# Patient Record
Sex: Male | Born: 1950
Health system: Southern US, Community
[De-identification: ages and names within clinical notes are randomized; demographics above are authoritative.]

## PROBLEM LIST (undated history)

## (undated) DIAGNOSIS — I482 Chronic atrial fibrillation, unspecified: Secondary | ICD-10-CM

## (undated) DIAGNOSIS — I5032 Chronic diastolic (congestive) heart failure: Secondary | ICD-10-CM

## (undated) DIAGNOSIS — R011 Cardiac murmur, unspecified: Secondary | ICD-10-CM

## (undated) DIAGNOSIS — C189 Malignant neoplasm of colon, unspecified: Secondary | ICD-10-CM

## (undated) DIAGNOSIS — G4700 Insomnia, unspecified: Secondary | ICD-10-CM

## (undated) DIAGNOSIS — I272 Pulmonary hypertension, unspecified: Secondary | ICD-10-CM

## (undated) DIAGNOSIS — N183 Chronic kidney disease, stage 3 (moderate): Secondary | ICD-10-CM

## (undated) DIAGNOSIS — D649 Anemia, unspecified: Secondary | ICD-10-CM

## (undated) DIAGNOSIS — K7460 Unspecified cirrhosis of liver: Secondary | ICD-10-CM

## (undated) DIAGNOSIS — E785 Hyperlipidemia, unspecified: Secondary | ICD-10-CM

## (undated) DIAGNOSIS — Z9289 Personal history of other medical treatment: Secondary | ICD-10-CM

## (undated) DIAGNOSIS — N189 Chronic kidney disease, unspecified: Secondary | ICD-10-CM

## (undated) DIAGNOSIS — K219 Gastro-esophageal reflux disease without esophagitis: Secondary | ICD-10-CM

## (undated) DIAGNOSIS — E039 Hypothyroidism, unspecified: Secondary | ICD-10-CM

## (undated) DIAGNOSIS — I2510 Atherosclerotic heart disease of native coronary artery without angina pectoris: Secondary | ICD-10-CM

## (undated) DIAGNOSIS — K635 Polyp of colon: Secondary | ICD-10-CM

## (undated) DIAGNOSIS — N289 Disorder of kidney and ureter, unspecified: Secondary | ICD-10-CM

## (undated) DIAGNOSIS — D696 Thrombocytopenia, unspecified: Secondary | ICD-10-CM

## (undated) DIAGNOSIS — F419 Anxiety disorder, unspecified: Secondary | ICD-10-CM

## (undated) DIAGNOSIS — G4730 Sleep apnea, unspecified: Secondary | ICD-10-CM

## (undated) DIAGNOSIS — E119 Type 2 diabetes mellitus without complications: Secondary | ICD-10-CM

## (undated) DIAGNOSIS — I10 Essential (primary) hypertension: Secondary | ICD-10-CM

## (undated) DIAGNOSIS — I429 Cardiomyopathy, unspecified: Secondary | ICD-10-CM

## (undated) DIAGNOSIS — E662 Morbid (severe) obesity with alveolar hypoventilation: Secondary | ICD-10-CM

## (undated) DIAGNOSIS — I509 Heart failure, unspecified: Secondary | ICD-10-CM

## (undated) DIAGNOSIS — K7581 Nonalcoholic steatohepatitis (NASH): Secondary | ICD-10-CM

## (undated) DIAGNOSIS — J45909 Unspecified asthma, uncomplicated: Secondary | ICD-10-CM

## (undated) DIAGNOSIS — E1122 Type 2 diabetes mellitus with diabetic chronic kidney disease: Secondary | ICD-10-CM

## (undated) HISTORY — PX: CIRCUMCISION: SUR203

## (undated) HISTORY — DX: Malignant neoplasm of colon, unspecified: C18.9

## (undated) HISTORY — DX: Heart failure, unspecified: I50.9

## (undated) HISTORY — DX: Chronic kidney disease, unspecified: N18.9

## (undated) HISTORY — DX: Type 2 diabetes mellitus without complications: E11.9

## (undated) HISTORY — DX: Sleep apnea, unspecified: G47.30

## (undated) HISTORY — DX: Polyp of colon: K63.5

## (undated) HISTORY — DX: Personal history of other medical treatment: Z92.89

## (undated) HISTORY — PX: COLON RESECTION: SHX5231

## (undated) HISTORY — DX: Hyperlipidemia, unspecified: E78.5

## (undated) HISTORY — DX: Atherosclerotic heart disease of native coronary artery without angina pectoris: I25.10

## (undated) HISTORY — DX: Cardiomyopathy, unspecified: I42.9

## (undated) HISTORY — DX: Essential (primary) hypertension: I10

## (undated) HISTORY — DX: Chronic kidney disease, stage 3 (moderate): N18.3

## (undated) HISTORY — DX: Type 2 diabetes mellitus with diabetic chronic kidney disease: E11.22

---

## 1997-12-23 ENCOUNTER — Ambulatory Visit (HOSPITAL_COMMUNITY): Admission: RE | Admit: 1997-12-23 | Discharge: 1997-12-23 | Payer: Self-pay | Admitting: Gastroenterology

## 1998-02-19 ENCOUNTER — Ambulatory Visit (HOSPITAL_BASED_OUTPATIENT_CLINIC_OR_DEPARTMENT_OTHER): Admission: RE | Admit: 1998-02-19 | Discharge: 1998-02-19 | Payer: Self-pay | Admitting: Surgery

## 1999-02-18 ENCOUNTER — Encounter: Payer: Self-pay | Admitting: Oncology

## 1999-02-18 ENCOUNTER — Encounter: Admission: RE | Admit: 1999-02-18 | Discharge: 1999-02-18 | Payer: Self-pay | Admitting: Oncology

## 1999-09-26 ENCOUNTER — Emergency Department (HOSPITAL_COMMUNITY): Admission: EM | Admit: 1999-09-26 | Discharge: 1999-09-26 | Payer: Self-pay | Admitting: Emergency Medicine

## 2000-02-29 ENCOUNTER — Encounter (INDEPENDENT_AMBULATORY_CARE_PROVIDER_SITE_OTHER): Payer: Self-pay | Admitting: *Deleted

## 2000-02-29 ENCOUNTER — Ambulatory Visit (HOSPITAL_COMMUNITY): Admission: RE | Admit: 2000-02-29 | Discharge: 2000-02-29 | Payer: Self-pay | Admitting: Gastroenterology

## 2000-03-11 ENCOUNTER — Inpatient Hospital Stay (HOSPITAL_COMMUNITY): Admission: EM | Admit: 2000-03-11 | Discharge: 2000-03-14 | Payer: Self-pay | Admitting: Emergency Medicine

## 2001-05-10 ENCOUNTER — Encounter: Admission: RE | Admit: 2001-05-10 | Discharge: 2001-05-10 | Payer: Self-pay | Admitting: Surgery

## 2001-05-10 ENCOUNTER — Encounter: Payer: Self-pay | Admitting: Surgery

## 2002-05-30 ENCOUNTER — Ambulatory Visit (HOSPITAL_COMMUNITY): Admission: RE | Admit: 2002-05-30 | Discharge: 2002-05-30 | Payer: Self-pay | Admitting: Gastroenterology

## 2002-05-30 ENCOUNTER — Encounter (INDEPENDENT_AMBULATORY_CARE_PROVIDER_SITE_OTHER): Payer: Self-pay | Admitting: Specialist

## 2006-02-21 ENCOUNTER — Inpatient Hospital Stay (HOSPITAL_COMMUNITY): Admission: EM | Admit: 2006-02-21 | Discharge: 2006-02-23 | Payer: Self-pay | Admitting: Emergency Medicine

## 2006-02-22 ENCOUNTER — Encounter (INDEPENDENT_AMBULATORY_CARE_PROVIDER_SITE_OTHER): Payer: Self-pay | Admitting: Cardiovascular Disease

## 2006-08-17 ENCOUNTER — Ambulatory Visit (HOSPITAL_COMMUNITY): Admission: RE | Admit: 2006-08-17 | Discharge: 2006-08-17 | Payer: Self-pay | Admitting: Cardiovascular Disease

## 2010-02-03 ENCOUNTER — Ambulatory Visit (HOSPITAL_BASED_OUTPATIENT_CLINIC_OR_DEPARTMENT_OTHER)
Admission: RE | Admit: 2010-02-03 | Discharge: 2010-02-03 | Disposition: A | Discharge status: Home / Self Care | Payer: Managed Care, Other (non HMO) | Attending: Urology | Admitting: Urology

## 2010-02-03 DIAGNOSIS — Z7901 Long term (current) use of anticoagulants: Secondary | ICD-10-CM | POA: Insufficient documentation

## 2010-02-03 DIAGNOSIS — I10 Essential (primary) hypertension: Secondary | ICD-10-CM | POA: Insufficient documentation

## 2010-02-03 DIAGNOSIS — E669 Obesity, unspecified: Secondary | ICD-10-CM | POA: Insufficient documentation

## 2010-02-03 DIAGNOSIS — N478 Other disorders of prepuce: Secondary | ICD-10-CM | POA: Insufficient documentation

## 2010-02-03 DIAGNOSIS — N471 Phimosis: Secondary | ICD-10-CM | POA: Insufficient documentation

## 2010-02-03 DIAGNOSIS — I4891 Unspecified atrial fibrillation: Secondary | ICD-10-CM | POA: Insufficient documentation

## 2010-02-03 DIAGNOSIS — E119 Type 2 diabetes mellitus without complications: Secondary | ICD-10-CM | POA: Insufficient documentation

## 2010-02-03 LAB — POCT I-STAT 4, (NA,K, GLUC, HGB,HCT)
Glucose, Bld: 128 mg/dL — ABNORMAL HIGH (ref 70–99)
HCT: 46 % (ref 39.0–52.0)
Hemoglobin: 15.6 g/dL (ref 13.0–17.0)
Potassium: 4 mEq/L (ref 3.5–5.1)
Sodium: 142 mEq/L (ref 135–145)

## 2010-02-03 LAB — PROTIME-INR
INR: 1.34 (ref 0.00–1.49)
Prothrombin Time: 16.8 seconds — ABNORMAL HIGH (ref 11.6–15.2)

## 2010-02-03 LAB — APTT: aPTT: 40 seconds — ABNORMAL HIGH (ref 24–37)

## 2010-02-08 NOTE — Op Note (Signed)
  NAMEFAIZ, William Flores                ACCOUNT NO.:  [id number]  MEDICAL RECORD NO.:  [id number]          PATIENT TYPE:  AMB  LOCATION:  [facility]                         FACILITY:  [facility]  PHYSICIAN:  Bernestine Amass, M.D.  DATE OF BIRTH:  [birth year]  DATE OF PROCEDURE: DATE OF DISCHARGE:                              OPERATIVE REPORT   PREOPERATIVE DIAGNOSIS:  Severe phimosis.  POSTOPERATIVE DIAGNOSIS:  Severe phimosis.  PROCEDURE PERFORMED:  Circumcision.  SURGEON:  Bernestine Amass, M.D.  ANESTHESIA:  IV sedation with penile block.  INDICATIONS:  [name] has had very longstanding problems with severe phimosis and has been unable to retract his foreskin for a number of years.  He has had chronic dysuria and has difficulty with voiding.  He was noted in our office to have a pinhole opening.  With urination, he has ballooning of the foreskin with chronic irritation and dysuria. Given the severity of his situation as well as the increased risk for ongoing problems with balanitis as well as penile carcinoma, circumcision was recommended.  The patient is on chronic anticoagulation.  We had permission to discontinue that with Lovenox bridge through his primary care facility.  The patient appeared to understand the rationale as well as potential risks and recovery issues for circumcision.  He understands that his situation maybe more complicated by the severity of phimosis and what is likely to be some significantly adherent mucosal collar.  The patient has been noted currently to have severe phimosis without gross balanitis.  Informed consent has been obtained.  The patient had coagulation studies, which were acceptable.  TECHNIQUE AND FINDINGS:  The patient was brought to the operating room. He received a penile block with a combination of plain Marcaine and lidocaine.  He was prepped and draped in the usual manner and had administration of IV sedation by the anesthesiology services.   A dorsal slit was required in order to be able to access the glans penis.  That foreskin tissue was markedly thickened and showed some chronic inflammatory changes.  Once the glans penis was exposed, it was reprepped.  There were chronic inflammatory changes with thickening on the glans penis.  The meatus otherwise appeared unremarkable.  There was considerable thickening and irritation along the frenulum with a thick frenular attachment as well.  A circumferential incision was made behind the coronal sulcus on the penile shaft.  The sleeve of redundant foreskin was then removed.  A portion of the mucosal collar was grossly adherent to the glans penis and could not be separated.  The skin edges were reapproximated with a number of 4-0 Vicryl sutures.  There were no problems with hemostasis. Some bacitracin ointment was applied and a nonadherent gauze placed very loosely around the incision.  The patient appeared to tolerate the procedure well and there were no obvious complications noted.     Bernestine Amass, M.D.     DSG/MEDQ  D:  02/03/2010  T:  02/03/2010  Job:  [id number]  cc:   [place] Fax: [phone]  Electronically Signed by Rana Snare M.D. on 02/08/2010 08:22:49 AM

## 2010-03-04 HISTORY — PX: US ECHOCARDIOGRAPHY: HXRAD669

## 2010-03-08 DIAGNOSIS — R072 Precordial pain: Secondary | ICD-10-CM

## 2010-05-18 NOTE — Cardiovascular Report (Signed)
NAMETAJUAN, DUFAULT NO.:  [id number]   MEDICAL RECORD NO.:  [id number]          PATIENT TYPE:  OIB   LOCATION:  2899                         FACILITY:  [facility]   PHYSICIAN:  Quay Burow, M.D.   DATE OF BIRTH:  1950/11/18   DATE OF PROCEDURE:  DATE OF DISCHARGE:                            CARDIAC CATHETERIZATION   DIRECT CURRENT CARDIOVERSION:  William Flores is a 60 year old gentleman with nonischemic cardiomyopathy,  EF of 30%, and atrial fibrillation, for which he is symptomatic.  He has  had 6 consecutive weeks of therapeutic Coumadin anticoagulation, who  presents now for outpatient DC cardioversion.   The patient came to the second floor outpatient day cath section C area.  Dr. Glennon Mac was the attending anesthesiologist.  AP pads were placed.  He received 260 mg of Pentothal.  He was shocked 4 times with biphasic  shocks beginning at 120 and increasing to 150 and 200 joules.  We were  unable to successfully cardiovert him, probably because of his impedance  and body habitus.   IMPRESSION:  Unsuccessful attempt at outpatient direct current  cardioversion of atrial fibrillation.   The patient will be discharged home.  I will see him back in the office  in 1-2 weeks.  I will consider referral to an electrophysiologist for  ablation.      Quay Burow, M.D.  Electronically Signed     JB/MEDQ  D:  08/17/2006  T:  08/17/2006  Job:  [id number]   cc:   [facility]

## 2010-05-21 NOTE — Discharge Summary (Signed)
[name], CARVER NO.:  [id number]   MEDICAL RECORD NO.:  [id number]          PATIENT TYPE:  INP   LOCATION:  5409                         FACILITY:  [facility]   PHYSICIAN:  Cyndia Bent, N.P.     DATE OF BIRTH:  1950/07/12   DATE OF ADMISSION:  02/20/2006  DATE OF DISCHARGE:  02/23/2006                               DISCHARGE SUMMARY   HISTORY:  [name] is a 60 year old Caucasian male who came to the  emergency room with atrial flutter of undetermined duration.  He had no  past cardiac medical history.  He was complaining about a 2-week history  of generalized fatigue.  He was placed on intravenous heparin.  He was  ruled out for an myocardial infarction.  It was decided that he should  undergo cardiac catheterization.  This was performed by Dr. [name] on  02/21/2006.  He was found to have only a 50% mid-posterior descending  artery stenosis.  His ejection fraction was 25% with global hypokinesis.  His medications were altered.  He was then placed on Lovenox to  Coumadin.  He was taught how to give himself injections.  On 02/23/2006  his heart rate was controlled at a rate of about 65.  He was considered  stable for discharged to home with Lovenox to Coumadin administration.  The patient goes to [place].  He requested to  have his pro times drawn at [facility].  I  called and left their Coumadin clinic a message about his dosing.   LABORATORY DATA:  Hemoglobin 13.5, hematocrit 40.2, WBC 7.5, platelets  157, INR 1.6.  Sodium 141, potassium 4.5, chloride 107, CO2 of 29,  glucose 157, BUN 19, creatinine 0.98, calcium 8.8, magnesium 2.1.  CK-MB  is negative x2.  Total cholesterol 143.  Triglycerides were 94.  HDL was  25, LDL was 99 and VLDL was 19.  His TSH was 4.669.   DISCHARGE MEDICATIONS:  1. Lovenox 140 mg subcu twice a day every 12 hours.  2. Warfarin 10 mg a day.  3. Simvastatin 80 mg a day.  4. Coreg  3.125 mg twice a day.  5. Aspirin 81 mg a day.  6. Micardis HCT as taken previously once a day.  7. Metformin (which is new) 500 mg twice a day.  8. Cardizem CD 180 mg once a day.   He was told not to take any more Actos because of concern for congestive  heart failure.  He should follow up with his doctors at [facility] for his diabetes control.  At the time of  discharge he had received 2 days of Coumadin 10 mg per day, and his INR  was 1.6.   DISCHARGE DIAGNOSES:  1. Paroxysmal atrial fibrillation.  2. Nonischemic cardiomyopathy with an ejection fraction of 25% by      cardiac catheterization.  3. Minimal coronary artery disease with only a 50% stenosis in his      posterior descending artery.  4. Non insulin dependent diabetes mellitus.  5. History of colon cancer  with resection.      Cyndia Bent, N.P.     BB/MEDQ  D:  02/23/2006  T:  02/24/2006  Job:  [id number]   cc:   [place]

## 2010-05-21 NOTE — Procedures (Signed)
[place]  Patient:    DARAN, FAVARO                       MRN: [id number] Proc. Date: 02/29/00 Adm. Date:  [id number] Attending:  Orvis Brill CC:         [place] Magrinat, M.D.   Procedure Report  PROCEDURE PERFORMED:  Colonoscopy with polypectomy.  INDICATIONS:  The patient with a history of colon cancer and colon polyps due for repeat screening.  Consult was signed after risks, benefits, methods and options thoroughly discussed multiple times in the past.  MEDICATIONS USED:  Demerol 100, Versed 10.  DESCRIPTION OF PROCEDURE:  Rectal inspection is pertinent for external hemorrhoids.  Digital exam was negative.  Video colonoscope was inserted easily advanced to the level of the anastomosis which was identified by colon and the small bowel end.  This did require some abdominal pressure but no position changed.  The prep was adequate.  There was some liquid stool that required a fair amount of washing and suctioning but on slow withdraw from the colon, four transverse polyps along folds were seen and hot biopsied times one or two and the scope was withdrawn around the descending, two slightly larger but still small polyps were seen and were both snared, electrocautery applied and they were suctioned through the scope and collected in the trap.  The scope was withdrawn around the sigmoid.  No additional polypoid lesions were seen.  Once back in the rectum the scope was retroflexed pertinent for some internal hemorrhoids.  The scope was reinserted a short ways up the sigmoid. Air was suctioned.  The scope was removed.  The patient tolerated the procedure well.  There was no obvious immediate complication.  ENDOSCOPIC DIAGNOSES: 1. Internal and external hemorrhoids. 2. Multiple six small polyps, four hot biopsied in the transverse, two snared    in the descending. 3. Otherwise within normal limits to anastomosis.  PLAN:  Await  pathology. Continue being followed by Dr. Jana Hakim with periodic CT scans and lab work.  Pending pathology, probably repeat colonoscopy in two years.  To continue two week post polypectomy instructions. DD:  02/29/00 TD:  02/29/00 Job: [id number]

## 2010-05-21 NOTE — Procedures (Signed)
[place]  Patient:    William Flores, William Flores                      MRN: [id number] Proc. Date: 03/12/00 Attending:  Mickeal Skinner, M.D. CC:         Jeryl Columbia, M.D.   Procedure Report  DATE OF BIRTH:  03-10-1950.  REFERRING PHYSICIAN:  PROCEDURE PERFORMED:  Colonoscopy.  ENDOSCOPIST:  Mickeal Skinner, M.D.  INDICATIONS FOR PROCEDURE:  The patient is a 60 year old male.  The patient underwent a colonoscopy February 29, 2000; six neoplastic noncancerous polyps were removed from the transverse colonn and descending colon.  William Flores developed postcolonoscopic polypectomy bleeding March 11, 2000 which prompted his hospitalization.  He received GoLYTELY colonic lavage prep but continued to bleed for approximately 24 hours posthospitalization.  There has been no significant fall in his hemoglobin.  Colonoscopy to achieve hemostasis is scheduled today.  ALLERGIES:  CODEINE.  CHRONIC MEDICATIONS:  None.  PAST MEDICAL HISTORY:  Segmental right colon resection for colon cancer. Postoperative chemotherapy.  HABITS.  William Flores does not smoke cigarettes or consume alcohol.  He has not consumed nonsteroidal anti-inflammatory drugs.  PREMEDICATION:  Versed 7 mg, Demerol 50 mg.  INSTRUMENT USED:  Olympus pediatric colonoscope.  DESCRIPTION OF PROCEDURE:  After obtaining informed consent, the patient was placed in the left lateral decubitus position.  I administered intravenous Demerol and intravenous Versed to achieve conscious sedation for the procedure.  The patients blood pressure, oxygen saturation and cardiac rhythm were monitored throughout the procedure and documented in the medical record.  The Olympus pediatric video colonoscope was then introduced into the rectum and easily advanced to the ileo-right colonic anastomosis.  There was blood throughout out the rectum and colon.  The site of bleeding was suspected to be  in the distal transverse colon where there was a large blood clot.  This appeared to be a polypectomy site.  The area was injected with epinephrine (1:10,000). The clot was removed without signs of further bleeding.  I attempted to use the Endoclip to prevent recurrent bleeding but we were unable to figure out how to actually use the Endoclip.  ASSESSMENT:  Postcolonoscopic polypectomy bleeding from a polypectomy site in the distal transverse colon. DD:  03/12/00 TD:  03/13/00 Job: [id number]

## 2010-05-21 NOTE — Op Note (Signed)
NAME:  William Flores, William Flores                          ACCOUNT NO.:  [id number]   MEDICAL RECORD NO.:  [id number]                   PATIENT TYPE:  AMB   LOCATION:  ENDO                                 FACILITY:  [facility]   PHYSICIAN:  Jeryl Columbia, M.D.                 DATE OF BIRTH:  [birth year]   DATE OF PROCEDURE:  05/30/2002  DATE OF DISCHARGE:                                 OPERATIVE REPORT   PROCEDURE:  Colonoscopy with polypectomy.   INDICATIONS FOR PROCEDURE:  A patient with a history of colon cancer and  colon polyps due for repeat screening.   Consent was signed after risks, benefits, methods, and options were  thoroughly discussed in the office on multiple occasions.   MEDICINES USED:  Demerol 100, Versed 9.   DESCRIPTION OF PROCEDURE:  Rectal inspection was pertinent for external  hemorrhoids, small. Digital exam was negative. The video colonoscope was  inserted, easily advanced around the colon to the anastomosis. No  significant worrisome lesions were seen on insertion. The anastomosis was  identified by the sutures in the small bowel mucosa as well as the colon  end. The scope was slowly withdrawn. Again the prep was fairly adequate, did  require lots of washing and suctioning for adequate visualization. He also  had lots of spasm and some difficulty holding air which was probably why  some small to tiny polyps are missed and why we find them frequently on his  colonoscopies. However, on slow withdrawal, approximately 6 tiny to small  polyps were seen and were all hot biopsied including sigmoid, descending and  transverse. There was another small polyp in the transverse which was  snared, electrocautery applied and the polyp was suctioned through the scope  and collected in the trap. They were all put in the same containers. In the  more distal transverse, a medium size polyp was seen, snared, electrocautery  applied, polyp was suctioned onto the head of the scope. The  scope was  removed, the polyp recovered. The scope was reinserted at that junction to  the level of the polypectomy site. On insertion, a tiny sigmoid polyp was  seen and was hot biopsied. We then continued our withdrawal and a few  additional tiny to small polyps were seen and were all hot biopsied and they  were all put in the first container. No significant other abnormalities were  seen. Once back in the rectum, the scope was retroflexed pertinent for some  internal hemorrhoids.  The scope was straightened and readvanced a short  ways up the left side of the colon, air and water were suctioned, scope  removed. The patient tolerated the procedure well. There was no obvious or  immediate complications.   ENDOSCOPIC DIAGNOSIS:  1. Internal and external hemorrhoids.  2. Distal transverse medium polyp status post snare put in the second     container.  3.  Multiple tiny to small polyps in the sigmoid, descending and transverse     hot biopsied with one small one in the transverse snared.  4. Otherwise within normal limits to the anastomosis.    PLAN:  Await pathology but probably recheck colon screening every 2-3 years  until colon free of polyps and happy to see back in the meantime p.r.n.;  otherwise, yearly rectals and guaiacs, labs per either Dr. Harlow Asa or Dahlia Client  at [facility] office and will put him on the customary two week no aspirin  or nonsteroidals.                                               Jeryl Columbia, M.D.    MEM/MEDQ  D:  05/30/2002  T:  05/30/2002  Job:  [id number]   cc:   Chipper Herb, M.D.  [place]  [place] [address]  Fax: [phone]   Earnstine Regal, M.D.  [address]  [place] [address]  Fax: [phone]

## 2010-05-21 NOTE — H&P (Signed)
[name], Flores NO.:  [id number]   MEDICAL RECORD NO.:  [id number]          PATIENT TYPE:  INP   LOCATION:  1740                         FACILITY:  [facility]   PHYSICIAN:  Broadus John, MD DATE OF BIRTH:  [birth year]   DATE OF ADMISSION:  02/20/2006  DATE OF DISCHARGE:                              HISTORY & PHYSICAL   William Flores is a 60 year old white male who is admitted to [facility]  for atrial flutter of undetermined duration.   The patient, who has no past history of cardiac disease, presented to  his family physician today with a two-week history of generalized  fatigue.  He was found to be in atrial fibrillation and referred to the  emergency department for further evaluation.   As noted, the patient has no past history of cardiac disease.  He has no  history of chest pain, myocardial infarction, coronary artery disease,  congestive heart failure, or arrhythmia.  However, he has a number of  risk factors for coronary artery disease including hypertension,  dyslipidemia, diabetes mellitus, and a family history of early coronary  artery disease (father).  There is no history of smoking.  During this  two week period, the patient has noted no chest pain, tightness,  heaviness, pressure, or squeezing, nor has he noted any dyspnea, cough,  chest congestion, or sputum production.   PAST MEDICAL HISTORY:  The patient's past medical history is otherwise  remarkable only for colon cancer for which he underwent partial  colectomy approximately seven years ago.   MEDICATIONS:  Unknown.   ALLERGIES:  CODEINE.   OPERATIONS:  Partial colectomy as described above.   FAMILY HISTORY:  His father suffered from coronary artery disease as  described above.  There is also a family history of diabetes.   SOCIAL HISTORY:  The patient lives with his ex-wife.  He works.  He does  not smoke cigarettes.  He does not drink alcohol.   REVIEW OF SYSTEMS:   No problems related to his head, eyes, ears, nose,  mouth, throat, lungs, gastrointestinal system, genitourinary system, or  extremities.  There is no history of neurological or psychiatric  disorder.  There is no history of fever, chills, or weight loss.   PHYSICAL EXAMINATION:  VITAL SIGNS:  Blood pressure 149/96. Pulse 119  and irregularly irregular.  Respirations 20.  Temperature 90.2.  Pulse  oximetry 97% on room air.  GENERAL:  The patient was an obese, middle-aged white man in no  discomfort.  He was alert, oriented, appropriate, and responsive.  HEENT:  Normal.  NECK:  Without thyromegaly or adenopathy.  Carotid pulses were palpable  bilaterally and without bruits.  CARDIAC:  Irregularly irregular rhythm.  There was no gallop, murmur,  rub, or click.  CHEST:  No chest wall tenderness was noted.  LUNGS:  Clear.  ABDOMEN:  Soft, nontender.  There was no mass, hepatosplenomegaly,  bruit, distention, rebound, guarding, or rigidity.  Bowel sounds were  normal.  RECTAL/GENITAL:  Not performed as they were not pertinent to the reason  for acute care  hospitalization.  EXTREMITIES:  Without edema, deviation, or deformity.  Radial and  dorsalis pedal pulses were palpable bilaterally.  NEUROLOGIC:  Brief  screening neurological survey was unremarkable.   LABORATORY DATA:  Electrocardiogram revealed atrial fibrillation with a  ventricular rate of 119 beats per minute.  The chest radiograph,  according to the radiologist, demonstrated cardiomegaly with vascular  congestion and diffuse interstitial disease, likely edema.  Small  bilateral pleural effusions and/or pleural thickening were noted.  Normal sinus rhythm.  There were no repolarization abnormalities  specific for ischemia or infarction.  The initial set of cardiac markers  revealed a myoglobin of 177, CK-MB 2.4, troponin less than 0.05.  Potassium was 3.7,  BUN 16, creatinine 0.8.  White count was 9.1, with a  hemoglobin of 14.8  and hematocrit of 43.5.  Fibrin derivatives were  0.26.  The remaining studies were pending at the time of this dictation.   IMPRESSION:  1. Atrial fibrillation of undetermined duration with a rapid      ventricular rate.  2. Hypertension.  3. Dyslipidemia.  4. Diabetes mellitus.  5. Status post colon cancer.   PLAN:  1. Telemetry.  2. Serial cardiac enzymes.  3. Aspirin.  4. Intravenous heparin.  5. Intravenous diltiazem for rate control.  6. Thyroid function tests.  7. Echocardiogram.  8. Further measures per Dr. Claiborne Billings.      Broadus John, MD  Electronically Signed     MSC/MEDQ  D:  02/21/2006  T:  02/21/2006  Job:  [id number]

## 2010-05-21 NOTE — Cardiovascular Report (Signed)
NAMETELL, ROZELLE NO.:  [id number]   MEDICAL RECORD NO.:  [id number]          PATIENT TYPE:  INP   LOCATION:  0086                         FACILITY:  [facility]   PHYSICIAN:  Quay Burow, M.D.   DATE OF BIRTH:  Apr 27, 1950   DATE OF PROCEDURE:  DATE OF DISCHARGE:                            CARDIAC CATHETERIZATION   Mr. Broughton is a 60 year old mildly overweight divorced white male with a  history of hypertension, hyperlipidemia, and noninsulin-requiring  diabetes.  He was admitted to the ER last night with atrial fibrillation  with RPR, coughing and atypical chest pain.  His cardiac enzymes were  negative.  He was placed on IV heparin and diltiazem for rate control.  He presents now for diagnostic coronary arteriography to define his  anatomy and rule out an ischemic etiology.   DESCRIPTION OF PROCEDURE:  The patient was brought to the second floor  [facility] cardiac catheterization lab in a postabsorptive state.  He  was premedicated with p.o. Valium.  His right groin was prepped and  shaved in the usual sterile fashion.  Xylocaine 1% was used for local  anesthesia.  A 6-French sheath was inserted into the right femoral  artery using standard Seldinger technique.  A 6-French right and left  Judkins diagnostic catheter as well as a 6-French pigtail catheter were  used for selective coronary angiography and left  ventriculography,  respectively.  Visipaque dye was used for the entirety of the case.  Retrograde aortic, left ventricular and pullback pressures were  recorded.   HEMODYNAMICS:  1. Aortic systolic pressure 761, diastolic pressure 84.  2. Left ventricle systolic pressure 950, end-diastolic pressure 26.   SELECTIVE CORONARY ANGIOGRAPHY:  1. Left main normal.  2. LAD normal.  3. Left circumflex normal.  4. Ramus intermedius branch normal.  5. Right coronary artery was dominant with a 50% fairly focal mid PDA      lesion, otherwise was normal.   LEFT VENTRICULOGRAPHY:  RAO left ventriculogram was performed using 25  mL of Visipaque dye at 12 mL/sec.  The overall LVEF was estimated at  approximately 25% with moderate to severe global hypokinesia.   IMPRESSION:  Mr. Matassa has essentially normal coronary arteries with  moderate severe left ventricular dysfunction suggesting a nonischemic  cardiomyopathy.  Medical therapy will be recommend.   The sheath was removed and pressure was held on the groin to achieve  hemostasis.  The patient left the lab in stable condition.  He will be  started on Lovenox and Coumadin anticoagulation as well as carvedilol  and ACE inhibition.  The plan will be ultimately to perform outpatient  DC cardioversion after 4-6 weeks of therapeutic anticoagulation.   The patient left the lab in stable condition.      Quay Burow, M.D.  Electronically Signed     JB/MEDQ  D:  02/21/2006  T:  02/21/2006  Job:  [id number]   cc:   Second Floor Cardiac Catheterization Lab  [place]

## 2010-05-21 NOTE — Discharge Summary (Signed)
[place]  Patient:    William Flores, William Flores                       MRN: [id number] Adm. Date:  [id number] Disc. Date: [id number] Attending:  Mauri Brooklyn Ii                           Discharge Summary  HISTORY:  The patient underwent an uneventful colonoscopy on February 29, 2000 and multiple small polyps were removed with a hot biopsy forceps and he was fine until the morning of admission when he had multiple bloody bowel movements without abdominal pain.  HOSPITAL COURSE:  He was admitted to the hospital by Howell Rucks, who proceeded on the 10th with a colonoscopy to try to identify the bleeding site, which he thought was one of the pump sites in the distal transverse colon, which had seemed to stop, although a visible vessel may have been seen.  He did inject it with epinephrine.  He had attempted to use the endoclip, but was unable to get it to work properly, but the bleeding had stopped with the injection of the epinephrine.  His hemoglobin was followed with drifted down but there were no signs of further bleeding and by the 12th, he was deemed ready for discharge.  He was watched on the 12th ______ low-residue breakfast and lunch.  DISCHARGE INSTRUCTIONS:  Tylenol only, no aspirin or nonsteroidals.  ACTIVITY:  Slowly advanced as tolerated.  DIET:  Continuous low-residue diet for two weeks.  WOUND CARE:  Not applicable.  SPECIAL INSTRUCTIONS:  To call me if any questions, problems, increased fatigue, shortness of breath, bleeding or pain and followup would be as needed or in six weeks to recheck stools, symptoms, possibly CBC and make sure no further workup or plans are needed.  His discharge hemoglobin was 11.3, he did not require a transfusion.  Other labs were essentially normal, except for an albumin of 3.2 and slightly increased sugar.  CONDITION ON DISCHARGE:  Improved.  DISCHARGE DIAGNOSES: 1. History of colon cancer and  colon polyps. 2. Post polypectomy bleeding. 3. Anemia secondary to #2. DD:  04/26/00 TD:  04/26/00 Job: [id number]

## 2012-03-26 ENCOUNTER — Other Ambulatory Visit: Payer: Self-pay | Admitting: Pharmacist

## 2012-03-26 ENCOUNTER — Telehealth: Payer: Self-pay | Admitting: Physician Assistant

## 2012-03-26 DIAGNOSIS — I4891 Unspecified atrial fibrillation: Secondary | ICD-10-CM

## 2012-03-26 MED ORDER — WARFARIN SODIUM 5 MG PO TABS: ORAL_TABLET | ORAL | Status: DC

## 2012-03-26 NOTE — Telephone Encounter (Signed)
Faxed request on the 20th. Not filled yet. Warfarin 25m , diltiazem 240 mg

## 2012-03-26 NOTE — Telephone Encounter (Signed)
Pt's last INR was in January 2014. Protime is needed.  LM on pt's VM to call office for appt.

## 2012-03-27 ENCOUNTER — Other Ambulatory Visit: Payer: Self-pay

## 2012-03-27 DIAGNOSIS — I4891 Unspecified atrial fibrillation: Secondary | ICD-10-CM

## 2012-03-27 MED ORDER — DILTIAZEM HCL ER COATED BEADS 240 MG PO CP24: 240.0000 mg | ORAL_CAPSULE | Freq: Every day | ORAL | Status: DC

## 2012-03-27 MED ORDER — WARFARIN SODIUM 5 MG PO TABS: ORAL_TABLET | ORAL | Status: DC

## 2012-03-29 ENCOUNTER — Ambulatory Visit (INDEPENDENT_AMBULATORY_CARE_PROVIDER_SITE_OTHER): Payer: BC Managed Care – PPO | Admitting: Pharmacist

## 2012-03-29 DIAGNOSIS — I4891 Unspecified atrial fibrillation: Secondary | ICD-10-CM | POA: Insufficient documentation

## 2012-03-29 MED ORDER — WARFARIN SODIUM 5 MG PO TABS: ORAL_TABLET | ORAL | Status: DC

## 2012-03-29 NOTE — Addendum Note (Signed)
Addended by: Cherre Robins on: 03/29/2012 04:25 PM   Modules accepted: Orders

## 2012-04-05 ENCOUNTER — Encounter: Payer: Self-pay | Admitting: Pharmacist

## 2012-04-05 NOTE — Progress Notes (Signed)
Patient ID: William Flores, male   DOB: [birth year], 62 y.o.   MRN: [id number]   erroneous encounter

## 2012-04-07 ENCOUNTER — Other Ambulatory Visit: Payer: Self-pay | Admitting: General Practice

## 2012-04-12 ENCOUNTER — Other Ambulatory Visit: Payer: Self-pay | Admitting: *Deleted

## 2012-04-12 MED ORDER — ATORVASTATIN CALCIUM 20 MG PO TABS: 20.0000 mg | ORAL_TABLET | Freq: Every day | ORAL | Status: DC

## 2012-04-12 NOTE — Telephone Encounter (Signed)
LAST LABS 9/13

## 2012-05-07 ENCOUNTER — Other Ambulatory Visit: Payer: Self-pay | Admitting: Family Medicine

## 2012-05-07 ENCOUNTER — Other Ambulatory Visit: Payer: Self-pay | Admitting: *Deleted

## 2012-05-07 MED ORDER — ALLOPURINOL 100 MG PO TABS: 100.0000 mg | ORAL_TABLET | Freq: Every day | ORAL | Status: DC

## 2012-05-07 NOTE — Telephone Encounter (Signed)
Patient last seen for chronic health check 11-04-11. Last seen in office on 3-27 for protime. Please advise. Thank you

## 2012-05-10 ENCOUNTER — Ambulatory Visit (INDEPENDENT_AMBULATORY_CARE_PROVIDER_SITE_OTHER): Payer: BC Managed Care – PPO | Admitting: Pharmacist

## 2012-05-10 DIAGNOSIS — I4891 Unspecified atrial fibrillation: Secondary | ICD-10-CM

## 2012-05-10 LAB — POCT INR: INR: 2.6

## 2012-05-10 NOTE — Patient Instructions (Signed)
Anticoagulation Dose Instructions as of 05/10/2012     Sun Mon Tue Wed Thu Fri Sat   New Dose 7.5 mg 10 mg 7.5 mg 10 mg 7.5 mg 10 mg 7.5 mg   Alt Week 10 mg 7.5 mg 10 mg 7.5 mg 10 mg 7.5 mg 10 mg    Description       Continue same dose        INR was 2.6 today

## 2012-05-25 ENCOUNTER — Encounter: Payer: Self-pay | Admitting: Gastroenterology

## 2012-05-25 ENCOUNTER — Ambulatory Visit (INDEPENDENT_AMBULATORY_CARE_PROVIDER_SITE_OTHER): Payer: BC Managed Care – PPO | Admitting: Physician Assistant

## 2012-05-25 ENCOUNTER — Encounter: Payer: Self-pay | Admitting: Physician Assistant

## 2012-05-25 VITALS — BP 125/72 | HR 56 | Temp 98.9°F | Ht 63.0 in | Wt 292.0 lb

## 2012-05-25 DIAGNOSIS — M109 Gout, unspecified: Secondary | ICD-10-CM | POA: Insufficient documentation

## 2012-05-25 DIAGNOSIS — E785 Hyperlipidemia, unspecified: Secondary | ICD-10-CM

## 2012-05-25 DIAGNOSIS — I10 Essential (primary) hypertension: Secondary | ICD-10-CM

## 2012-05-25 DIAGNOSIS — C189 Malignant neoplasm of colon, unspecified: Secondary | ICD-10-CM

## 2012-05-25 DIAGNOSIS — I119 Hypertensive heart disease without heart failure: Secondary | ICD-10-CM | POA: Insufficient documentation

## 2012-05-25 DIAGNOSIS — E139 Other specified diabetes mellitus without complications: Secondary | ICD-10-CM

## 2012-05-25 DIAGNOSIS — E119 Type 2 diabetes mellitus without complications: Secondary | ICD-10-CM

## 2012-05-25 LAB — BASIC METABOLIC PANEL WITH GFR
Calcium: 9.5 mg/dL (ref 8.4–10.5)
Chloride: 106 mEq/L (ref 96–112)
Creat: 1.12 mg/dL (ref 0.50–1.35)
GFR, Est Non African American: 71 mL/min
Sodium: 141 mEq/L (ref 135–145)

## 2012-05-25 LAB — LIPID PANEL
HDL: 26 mg/dL — ABNORMAL LOW (ref >39)
LDL Cholesterol: 50 mg/dL (ref 0–99)
Total CHOL/HDL Ratio: 4.8 Ratio
Triglycerides: 246 mg/dL — ABNORMAL HIGH (ref <150)

## 2012-05-25 LAB — HEPATIC FUNCTION PANEL
Albumin: 3.6 g/dL (ref 3.5–5.2)
Alkaline Phosphatase: 61 U/L (ref 39–117)
Total Protein: 6.7 g/dL (ref 6.0–8.3)

## 2012-05-25 MED ORDER — ATORVASTATIN CALCIUM 20 MG PO TABS: 20.0000 mg | ORAL_TABLET | Freq: Every day | ORAL | Status: DC

## 2012-05-25 MED ORDER — ALLOPURINOL 100 MG PO TABS: 100.0000 mg | ORAL_TABLET | Freq: Every day | ORAL | Status: DC

## 2012-05-25 MED ORDER — SITAGLIPTIN PHOS-METFORMIN HCL 50-1000 MG PO TABS: 1.0000 | ORAL_TABLET | Freq: Two times a day (BID) | ORAL | Status: DC

## 2012-05-25 MED ORDER — ALLOPURINOL 300 MG PO TABS: 300.0000 mg | ORAL_TABLET | Freq: Every day | ORAL | Status: DC

## 2012-05-25 MED ORDER — DILTIAZEM HCL ER BEADS 240 MG PO CP24: 240.0000 mg | ORAL_CAPSULE | Freq: Every day | ORAL | Status: DC

## 2012-05-25 MED ORDER — LISINOPRIL 20 MG PO TABS: 20.0000 mg | ORAL_TABLET | Freq: Every day | ORAL | Status: DC

## 2012-05-25 MED ORDER — VALSARTAN-HYDROCHLOROTHIAZIDE 320-25 MG PO TABS: 1.0000 | ORAL_TABLET | Freq: Every day | ORAL | Status: DC

## 2012-05-25 NOTE — Progress Notes (Signed)
Subjective:     Patient ID: William Flores, male   DOB: March 24, 1950, 62 y.o.   MRN: [id number]  Diabetes He presents for his follow-up diabetic visit. He has type 2 diabetes mellitus. No MedicAlert identification noted. His disease course has been stable. There are no hypoglycemic associated symptoms. There are no diabetic associated symptoms. There are no hypoglycemic complications. Symptoms are stable. Diabetic complications include a CVA, heart disease and PVD. Risk factors for coronary artery disease include dyslipidemia, diabetes mellitus, family history, hypertension, male sex and obesity. He is compliant with treatment all of the time. His weight is increasing steadily. He is following a high fat/cholesterol and generally unhealthy diet. When asked about meal planning, he reported none. He has not had a previous visit with a dietician. He rarely participates in exercise. There is no change in his home blood glucose trend. His overall blood glucose range is 130-140 mg/dl. An ACE inhibitor/angiotensin II receptor blocker is being taken. He does not see a podiatrist.Eye exam is not current.  Hypertension This is a chronic problem. The current episode started more than 1 year ago. The problem has been resolved since onset. The problem is controlled. Risk factors for coronary artery disease include diabetes mellitus, dyslipidemia, family history, male gender, obesity and sedentary lifestyle. Past treatments include ACE inhibitors, diuretics and calcium channel blockers. The current treatment provides significant improvement. Compliance problems include exercise.  Hypertensive end-organ damage includes CVA and PVD.  Pt notes recent increase in weight He states his aunt passed away and she did all of the cooking He is now having to eat out and not eating as healthy   Review of Systems  All other systems reviewed and are negative.       Objective:   Physical Exam  Nursing note and vitals  reviewed. Constitutional: He appears well-developed and well-nourished.  HENT:  Right Ear: External ear normal.  Left Ear: External ear normal.  Nose: Nose normal.  Mouth/Throat: Oropharynx is clear and moist.  Neck: Neck supple. No JVD present. No tracheal deviation present.  Cardiovascular: Normal heart sounds and intact distal pulses.  An irregularly irregular rhythm present. Bradycardia present.   Pulmonary/Chest: Effort normal and breath sounds normal.  Musculoskeletal:  Lower ext with hyperpigmentation No edema or ulcerations today  Lymphadenopathy:    He has no cervical adenopathy.       Assessment:     1. HTN (hypertension)   2. Gout   3. Other and unspecified hyperlipidemia   4. Diabetes 1.5, managed as type 2        Plan:     Pt to make eye exam Referral for f/u colonoscopy Refill of meds today x 59mo Pt will try to eat better diet Due to slip in A1C f/u in 3 mos

## 2012-05-25 NOTE — Patient Instructions (Signed)
Diabetes and Foot Care Diabetes may cause you to have a poor blood supply (circulation) to your legs and feet. Because of this, the skin may be thinner, break easier, and heal more slowly. You also may have nerve damage in your legs and feet causing decreased feeling. You may not notice minor injuries to your feet that could lead to serious problems or infections. Taking care of your feet is one of the most important things you can do for yourself.  HOME CARE INSTRUCTIONS  Do not go barefoot. Bare feet are easily injured.  Check your feet daily for blisters, cuts, and redness.  Wash your feet with warm water (not hot) and mild soap. Pat your feet and between your toes until completely dry.  Apply a moisturizing lotion that does not contain alcohol or petroleum jelly to the dry skin on your feet and to dry brittle toenails. Do not put it between your toes.  Trim your toenails straight across. Do not dig under them or around the cuticle.  Do not cut corns or calluses, or try to remove them with medicine.  Wear clean cotton socks or stockings every day. Make sure they are not too tight. Do not wear knee high stockings since they may decrease blood flow to your legs.  Wear leather shoes that fit properly and have enough cushioning. To break in new shoes, wear them just a few hours a day to avoid injuring your feet.  Wear shoes at all times, even in the house.  Do not cross your legs. This may decrease the blood flow to your feet.  If you find a minor scrape, cut, or break in the skin on your feet, keep it and the skin around it clean and dry. These areas may be cleansed with mild soap and water. Do not use peroxide, alcohol, iodine or Merthiolate.  When you remove an adhesive bandage, be sure not to harm the skin around it.  If you have a wound, look at it several times a day to make sure it is healing.  Do not use heating pads or hot water bottles. Burns can occur. If you have lost feeling  in your feet or legs, you may not know it is happening until it is too late.  Report any cuts, sores or bruises to your caregiver. Do not wait! SEEK MEDICAL CARE IF:   You have an injury that is not healing or you notice redness, numbness, burning, or tingling.  Your feet always feel cold.  You have pain or cramps in your legs and feet. SEEK IMMEDIATE MEDICAL CARE IF:   There is increasing redness, swelling, or increasing pain in the wound.  There is a red line that goes up your leg.  Pus is coming from a wound.  You develop an unexplained oral temperature above 102 F (38.9 C), or as your caregiver suggests.  You notice a bad smell coming from an ulcer or wound. MAKE SURE YOU:   Understand these instructions.  Will watch your condition.  Will get help right away if you are not doing well or get worse. Document Released: 12/18/1999 Document Revised: 03/14/2011 Document Reviewed: 06/25/2008 [facility] Patient Information 2014 [place], [place]. Hypertension As your heart beats, it forces blood through your arteries. This force is your blood pressure. If the pressure is too high, it is called hypertension (HTN) or high blood pressure. HTN is dangerous because you may have it and not know it. High blood pressure may mean that your  heart has to work harder to pump blood. Your arteries may be narrow or stiff. The extra work puts you at risk for heart disease, stroke, and other problems.  Blood pressure consists of two numbers, a higher number over a lower, 110/72, for example. It is stated as "110 over 72." The ideal is below 120 for the top number (systolic) and under 80 for the bottom (diastolic). Write down your blood pressure today. You should pay close attention to your blood pressure if you have certain conditions such as:  Heart failure.  Prior heart attack.  Diabetes  Chronic kidney disease.  Prior stroke.  Multiple risk factors for heart disease. To see if you have HTN,  your blood pressure should be measured while you are seated with your arm held at the level of the heart. It should be measured at least twice. A one-time elevated blood pressure reading (especially in the Emergency Department) does not mean that you need treatment. There may be conditions in which the blood pressure is different between your right and left arms. It is important to see your caregiver soon for a recheck. Most people have essential hypertension which means that there is not a specific cause. This type of high blood pressure may be lowered by changing lifestyle factors such as:  Stress.  Smoking.  Lack of exercise.  Excessive weight.  Drug/tobacco/alcohol use.  Eating less salt. Most people do not have symptoms from high blood pressure until it has caused damage to the body. Effective treatment can often prevent, delay or reduce that damage. TREATMENT  When a cause has been identified, treatment for high blood pressure is directed at the cause. There are a large number of medications to treat HTN. These fall into several categories, and your caregiver will help you select the medicines that are best for you. Medications may have side effects. You should review side effects with your caregiver. If your blood pressure stays high after you have made lifestyle changes or started on medicines,   Your medication(s) may need to be changed.  Other problems may need to be addressed.  Be certain you understand your prescriptions, and know how and when to take your medicine.  Be sure to follow up with your caregiver within the time frame advised (usually within two weeks) to have your blood pressure rechecked and to review your medications.  If you are taking more than one medicine to lower your blood pressure, make sure you know how and at what times they should be taken. Taking two medicines at the same time can result in blood pressure that is too low. SEEK IMMEDIATE MEDICAL CARE  IF:  You develop a severe headache, blurred or changing vision, or confusion.  You have unusual weakness or numbness, or a faint feeling.  You have severe chest or abdominal pain, vomiting, or breathing problems. MAKE SURE YOU:   Understand these instructions.  Will watch your condition.  Will get help right away if you are not doing well or get worse. Document Released: 12/20/2004 Document Revised: 03/14/2011 Document Reviewed: 08/10/2007 [facility] Patient Information 2014 [place].

## 2012-06-20 ENCOUNTER — Telehealth: Payer: Self-pay | Admitting: *Deleted

## 2012-06-20 ENCOUNTER — Encounter: Payer: Self-pay | Admitting: Gastroenterology

## 2012-06-20 ENCOUNTER — Ambulatory Visit (INDEPENDENT_AMBULATORY_CARE_PROVIDER_SITE_OTHER): Payer: BC Managed Care – PPO | Admitting: Gastroenterology

## 2012-06-20 VITALS — BP 172/80 | HR 72 | Ht 63.0 in | Wt 287.8 lb

## 2012-06-20 DIAGNOSIS — Z8500 Personal history of malignant neoplasm of unspecified digestive organ: Secondary | ICD-10-CM

## 2012-06-20 NOTE — Assessment & Plan Note (Signed)
Patient is due for followup colonoscopy. I'll check with his PCP whether Coumadin can be held.

## 2012-06-20 NOTE — Telephone Encounter (Signed)
[place] [phone] Fax [phone] Phone  06/20/2012    RE: William Flores DOB: 1950/01/10 MRN: [id number]   Dear [name] [place],    We have scheduled the above patient for an endoscopic procedure. Our records show that he is on anticoagulation therapy.   Please advise as to how long the patient may come off his therapy of coumadin prior to the procedure, which is scheduled for 08/14/2012.  Please fax back/ or route the completed form to [facility] at [phone].   Sincerely,  Genella Mech

## 2012-06-20 NOTE — Patient Instructions (Addendum)
You will be contaced by our office prior to your procedure for directions on holding your Coumadin/Warfarin.  If you do not hear from our office 1 week prior to your scheduled procedure, please call [phone] to discuss.  You have been scheduled for a colonoscopy with propofol. Please follow written instructions given to you at your visit today.  Please pick up your prep kit at the pharmacy within the next 1-3 days. If you use inhalers (even only as needed), please bring them with you on the day of your procedure. Your physician has requested that you go to www.startemmi.com and enter the access code given to you at your visit today. This web site gives a general overview about your procedure. However, you should still follow specific instructions given to you by our office regarding your preparation for the procedure.  SUPREP sample given

## 2012-06-20 NOTE — Progress Notes (Signed)
History of Present Illness: William Flores 62 year old white male with history of colon cancer, atrial fibrillation, on Coumadin, referred at the request of Dr. Laurance Flatten for colonoscopy. Colon cancer was resected about 10-12 years ago. Last colonoscopy was at least 6 years ago. At one point he had a what sounds post polypectomy bleed. He currently has no GI complaints including change of bowel habits, abdominal pain, melena or hematochezia.     Past Medical History  Diagnosis Date  . Diabetes   . Colon cancer   . Hypertension   . Sleep apnea    Past Surgical History  Procedure Laterality Date  . Colon resection     family history includes Breast cancer in his mother and Diabetes in his mother. Current Outpatient Prescriptions  Medication Sig Dispense Refill  . allopurinol (ZYLOPRIM) 100 MG tablet Take 1 tablet (100 mg total) by mouth daily. Take 1 tablet by mouth daily along with the 39m  30 tablet  0  . allopurinol (ZYLOPRIM) 300 MG tablet Take 1 tablet (300 mg total) by mouth daily.  30 tablet  3  . aspirin 81 MG tablet Take 81 mg by mouth daily.      .Marland Kitchenatorvastatin (LIPITOR) 20 MG tablet Take 1 tablet (20 mg total) by mouth daily.  30 tablet  3  . diltiazem (TIAZAC) 240 MG 24 hr capsule Take 1 capsule (240 mg total) by mouth daily.  30 capsule  3  . lisinopril (PRINIVIL,ZESTRIL) 20 MG tablet Take 1 tablet (20 mg total) by mouth daily.  30 tablet  3  . sitaGLIPtan-metformin (JANUMET) 50-1000 MG per tablet Take 1 tablet by mouth 2 (two) times daily with a meal.  60 tablet  3  . valsartan-hydrochlorothiazide (DIOVAN-HCT) 320-25 MG per tablet Take 1 tablet by mouth daily.  30 tablet  3  . warfarin (COUMADIN) 5 MG tablet Take 1 to 2 tablets by mouth daily as directed by anticoag clinic  60 tablet  2   No current facility-administered medications for this visit.   Allergies as of 06/20/2012 - Review Complete 06/20/2012  Allergen Reaction Noted  . Codeine  03/29/2012    reports that he has  never smoked. He has never used smokeless tobacco. He reports that he does not drink alcohol or use illicit drugs.     Review of Systems: Pertinent positive and negative review of systems were noted in the above HPI section. All other review of systems were otherwise negative.  Vital signs were reviewed in today's medical record Physical Exam: General: He is an obese male in no acute distress Skin: anicteric Head: Normocephalic and atraumatic Eyes:  sclerae anicteric, EOMI Ears: Normal auditory acuity Mouth: No deformity or lesions Neck: Supple, no masses or thyromegaly Lungs: Clear throughout to auscultation Heart: Regular rate and rhythm; no murmurs, rubs or bruits Abdomen: Soft, non tender and non distended. No masses, hepatosplenomegaly or hernias noted. Normal Bowel sounds Rectal:deferred Musculoskeletal: Symmetrical with no gross deformities  Skin: No lesions on visible extremities Pulses:  Normal pulses noted Extremities: There are chronic venous stasis changes bilaterally in the lower extremities Neurological: Alert oriented x 4, grossly nonfocal Cervical Nodes:  No significant cervical adenopathy Inguinal Nodes: No significant inguinal adenopathy Psychological:  Alert and cooperative. Normal mood and affect          Past Medical History  Diagnosis Date  . Diabetes   . Colon cancer   . Hypertension   . Sleep apnea    Past Surgical History  Procedure  Laterality Date  . Colon resection     family history includes Breast cancer in his mother and Diabetes in his mother. Current Outpatient Prescriptions  Medication Sig Dispense Refill  . allopurinol (ZYLOPRIM) 100 MG tablet Take 1 tablet (100 mg total) by mouth daily. Take 1 tablet by mouth daily along with the 358m  30 tablet  0  . allopurinol (ZYLOPRIM) 300 MG tablet Take 1 tablet (300 mg total) by mouth daily.  30 tablet  3  . aspirin 81 MG tablet Take 81 mg by mouth daily.      .Marland Kitchenatorvastatin (LIPITOR) 20  MG tablet Take 1 tablet (20 mg total) by mouth daily.  30 tablet  3  . diltiazem (TIAZAC) 240 MG 24 hr capsule Take 1 capsule (240 mg total) by mouth daily.  30 capsule  3  . lisinopril (PRINIVIL,ZESTRIL) 20 MG tablet Take 1 tablet (20 mg total) by mouth daily.  30 tablet  3  . sitaGLIPtan-metformin (JANUMET) 50-1000 MG per tablet Take 1 tablet by mouth 2 (two) times daily with a meal.  60 tablet  3  . valsartan-hydrochlorothiazide (DIOVAN-HCT) 320-25 MG per tablet Take 1 tablet by mouth daily.  30 tablet  3  . warfarin (COUMADIN) 5 MG tablet Take 1 to 2 tablets by mouth daily as directed by anticoag clinic  60 tablet  2   No current facility-administered medications for this visit.   Allergies as of 06/20/2012 - Review Complete 06/20/2012  Allergen Reaction Noted  . Codeine  03/29/2012    reports that he has never smoked. He has never used smokeless tobacco. He reports that he does not drink alcohol or use illicit drugs.     Review of Systems: Pertinent positive and negative review of systems were noted in the above HPI section. All other review of systems were otherwise negative.  Vital signs were reviewed in today's medical record Physical Exam: General: Well developed , well nourished, no acute distress Skin: anicteric Head: Normocephalic and atraumatic Eyes:  sclerae anicteric, EOMI Ears: Normal auditory acuity Mouth: No deformity or lesions Neck: Supple, no masses or thyromegaly Lungs: Clear throughout to auscultation Heart: Regular rate and rhythm; no murmurs, rubs or bruits Abdomen: Soft, non tender and non distended. No masses, hepatosplenomegaly or hernias noted. Normal Bowel sounds Rectal:deferred Musculoskeletal: Symmetrical with no gross deformities  Skin: No lesions on visible extremities Pulses:  Normal pulses noted Extremities: No clubbing, cyanosis, edema or deformities noted Neurological: Alert oriented x 4, grossly nonfocal Cervical Nodes:  No significant  cervical adenopathy Inguinal Nodes: No significant inguinal adenopathy Psychological:  Alert and cooperative. Normal mood and affect

## 2012-06-21 NOTE — Telephone Encounter (Signed)
Scheduled visit with one of the newer providers.

## 2012-06-21 NOTE — Telephone Encounter (Signed)
appt discuss- will see tammy on Monday then already has appt with dr Jacelyn Grip

## 2012-06-25 ENCOUNTER — Ambulatory Visit (INDEPENDENT_AMBULATORY_CARE_PROVIDER_SITE_OTHER): Payer: BC Managed Care – PPO | Admitting: Pharmacist

## 2012-06-25 DIAGNOSIS — I4891 Unspecified atrial fibrillation: Secondary | ICD-10-CM

## 2012-06-25 LAB — POCT INR: INR: 3.1

## 2012-06-25 NOTE — Patient Instructions (Addendum)
Anticoagulation Dose Instructions as of 06/25/2012     Dorene Grebe Tue Wed Thu Fri Sat   New Dose 7.5 mg 10 mg 7.5 mg 10 mg 7.5 mg 10 mg 7.5 mg   Alt Week 10 mg 7.5 mg 10 mg 7.5 mg 10 mg 7.5 mg 10 mg    Description       Take 1 tablet instead of 1 and 1/2 just today, then resume same dose. Prior to colonoscopy hold warfarin 5 days prior - starting August 7th.  May resume warfarin after procedure as advised by GI.       INR was 3.1 today.

## 2012-07-04 ENCOUNTER — Other Ambulatory Visit: Payer: Self-pay | Admitting: Family Medicine

## 2012-07-12 ENCOUNTER — Other Ambulatory Visit: Payer: Self-pay

## 2012-07-12 MED ORDER — ALLOPURINOL 100 MG PO TABS: 100.0000 mg | ORAL_TABLET | Freq: Every day | ORAL | Status: DC

## 2012-08-06 ENCOUNTER — Ambulatory Visit (INDEPENDENT_AMBULATORY_CARE_PROVIDER_SITE_OTHER): Payer: BC Managed Care – PPO | Admitting: Pharmacist

## 2012-08-06 DIAGNOSIS — I4891 Unspecified atrial fibrillation: Secondary | ICD-10-CM

## 2012-08-06 NOTE — Patient Instructions (Addendum)
Colonoscopy scheduled for 7:30 Tuesday, August 12th.  Patient instructed to hold warfarin starting August 7th and restart as directed by gastro specialists.  Anticoagulation Dose Instructions as of 08/06/2012     William Flores Tue Wed Thu Fri Sat   New Dose 7.5 mg 10 mg 7.5 mg 7.5 mg 7.5 mg 10 mg 7.5 mg    Description       No warfarin today, then start 50m Mondays and Fridays and 7.581mall other days. Prior to colonoscopy hold warfarin 5 days prior - starting August 7th.  May resume warfarin after procedure as advised by GI.        INR was 3.5 today

## 2012-08-07 ENCOUNTER — Telehealth: Payer: Self-pay | Admitting: *Deleted

## 2012-08-09 ENCOUNTER — Other Ambulatory Visit: Payer: Self-pay | Admitting: *Deleted

## 2012-08-09 ENCOUNTER — Telehealth: Payer: Self-pay | Admitting: Pharmacist

## 2012-08-09 DIAGNOSIS — Z1211 Encounter for screening for malignant neoplasm of colon: Secondary | ICD-10-CM

## 2012-08-09 NOTE — Telephone Encounter (Signed)
Doc,      This patient does not qualify for services at [facility] due to morbid obesity and a BMI of 50.99.      Thanks for your consideration in this matter.      With kind regards,      John Nulty                  Left message for pt, that procedure in [place] will be cancelled . Procedure needs to be rescheduled at [facility]. Asked pt to contact the off as soon as possible to reschedue

## 2012-08-09 NOTE — Telephone Encounter (Signed)
noted 

## 2012-08-14 ENCOUNTER — Encounter: Payer: BC Managed Care – PPO | Admitting: Gastroenterology

## 2012-08-14 ENCOUNTER — Telehealth: Payer: Self-pay | Admitting: *Deleted

## 2012-08-14 NOTE — Telephone Encounter (Signed)
Patients procedure moved to 09/21/2012 at 12:30pm L/m for pt

## 2012-08-14 NOTE — Telephone Encounter (Signed)
Pt understand time change of his hospital procedure

## 2012-08-30 ENCOUNTER — Ambulatory Visit (INDEPENDENT_AMBULATORY_CARE_PROVIDER_SITE_OTHER): Payer: BC Managed Care – PPO | Admitting: Pharmacist

## 2012-08-30 ENCOUNTER — Encounter: Payer: Self-pay | Admitting: Family Medicine

## 2012-08-30 ENCOUNTER — Ambulatory Visit (INDEPENDENT_AMBULATORY_CARE_PROVIDER_SITE_OTHER): Payer: BC Managed Care – PPO | Admitting: Family Medicine

## 2012-08-30 VITALS — BP 139/75 | HR 71 | Temp 97.7°F | Wt 291.1 lb

## 2012-08-30 DIAGNOSIS — M109 Gout, unspecified: Secondary | ICD-10-CM

## 2012-08-30 DIAGNOSIS — Z8500 Personal history of malignant neoplasm of unspecified digestive organ: Secondary | ICD-10-CM

## 2012-08-30 DIAGNOSIS — E119 Type 2 diabetes mellitus without complications: Secondary | ICD-10-CM

## 2012-08-30 DIAGNOSIS — E139 Other specified diabetes mellitus without complications: Secondary | ICD-10-CM

## 2012-08-30 DIAGNOSIS — I4891 Unspecified atrial fibrillation: Secondary | ICD-10-CM

## 2012-08-30 DIAGNOSIS — E785 Hyperlipidemia, unspecified: Secondary | ICD-10-CM

## 2012-08-30 DIAGNOSIS — I10 Essential (primary) hypertension: Secondary | ICD-10-CM

## 2012-08-30 LAB — POCT GLYCOSYLATED HEMOGLOBIN (HGB A1C): Hemoglobin A1C: 7.3

## 2012-08-30 MED ORDER — COLCHICINE 0.6 MG PO TABS: 0.6000 mg | ORAL_TABLET | Freq: Every day | ORAL | Status: DC

## 2012-08-30 NOTE — Progress Notes (Signed)
Patient ID: William Flores, male   DOB: [birth year], 62 y.o.   MRN: [id number] SUBJECTIVE: CC: Chief Complaint  Patient presents with  . Follow-up    3 month follow up thinks has gout in right foot    HPI: Patient is here for follow up of Diabetes Mellitus: Symptoms evaluated: Denies Nocturia ,Denies Urinary Frequency , denies Blurred vision ,deniesDizziness,denies.Dysuria,denies paresthesias, denies extremity pain or ulcers.Marland Kitchendenies chest pain. has had an annual eye exam. do check the feet. Does check CBGs. Average CBG:160-170 Denies episodes of hypoglycemia. Does have an emergency hypoglycemic plan. admits toCompliance with medications. Denies Problems with medications.  Breakfast: peanut butter sandwich on wheat, usually egg biscuit Lunch: sandwich pimento cheese Supper: green beans, potatoes, meat Diet sodas.  Past Medical History  Diagnosis Date  . Diabetes   . Colon cancer   . Hypertension   . Sleep apnea   . Atrial fibrillation    Past Surgical History  Procedure Laterality Date  . Colon resection     History   Social History  . Marital Status: Married    Spouse Name: N/A    Number of Children: 1  . 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 Smoking status: Never Smoker   . Smokeless tobacco: Never Used  . Alcohol Use: No  . Drug Use: No  . Sexual Activity: Not on file   Other Topics Concern  . Not on file   Social History Narrative  . No narrative on file   Family History  Problem Relation Age of Onset  . Breast cancer Mother   . Diabetes Mother    Current Outpatient Prescriptions on File Prior to Visit  Medication Sig Dispense Refill  . allopurinol (ZYLOPRIM) 100 MG tablet Take 1 tablet (100 mg total) by mouth daily. Take 1 tablet by mouth daily along with the 356m  30 tablet  2  . allopurinol (ZYLOPRIM) 300 MG tablet Take 1 tablet (300 mg total) by mouth daily.  30 tablet  3  .  aspirin 81 MG tablet Take 81 mg by mouth daily.      .Marland Kitchenatorvastatin (LIPITOR) 20 MG tablet Take 1 tablet (20 mg total) by mouth daily.  30 tablet  3  . diltiazem (TIAZAC) 240 MG 24 hr capsule Take 1 capsule (240 mg total) by mouth daily.  30 capsule  3  . lisinopril (PRINIVIL,ZESTRIL) 20 MG tablet Take 1 tablet (20 mg total) by mouth daily.  30 tablet  3  . sitaGLIPtan-metformin (JANUMET) 50-1000 MG per tablet Take 1 tablet by mouth 2 (two) times daily with a meal.  60 tablet  3  . valsartan-hydrochlorothiazide (DIOVAN-HCT) 320-25 MG per tablet Take 1 tablet by mouth daily.  30 tablet  3  . warfarin (COUMADIN) 5 MG tablet TAKE 1 TO 2 TABLETS DAILY AS DIRECTED BY ANTICOAGULATION CLINIC  60 tablet  2   No current facility-administered medications on file prior to visit.   Allergies  Allergen Reactions  . Codeine     Headache     There is no immunization history on file for this patient. Prior to Admission medications   Medication Sig Start Date End Date Taking? Authorizing Provider  allopurinol (ZYLOPRIM) 100 MG tablet Take 1 tablet (100 mg total) by mouth daily. Take 1 tablet by mouth daily along with the 3042m7/10/14  Yes WiLodema PilotPA-C  allopurinol (ZYLOPRIM) 300 MG tablet Take 1  tablet (300 mg total) by mouth daily. 05/25/12  Yes Lodema Pilot, PA-C  aspirin 81 MG tablet Take 81 mg by mouth daily.   Yes Historical Provider, MD  atorvastatin (LIPITOR) 20 MG tablet Take 1 tablet (20 mg total) by mouth daily. 05/25/12  Yes Lodema Pilot, PA-C  diltiazem (TIAZAC) 240 MG 24 hr capsule Take 1 capsule (240 mg total) by mouth daily. 05/25/12  Yes Lodema Pilot, PA-C  lisinopril (PRINIVIL,ZESTRIL) 20 MG tablet Take 1 tablet (20 mg total) by mouth daily. 05/25/12  Yes Lodema Pilot, PA-C  sitaGLIPtan-metformin (JANUMET) 50-1000 MG per tablet Take 1 tablet by mouth 2 (two) times daily with a meal. 05/25/12  Yes Lodema Pilot, PA-C  valsartan-hydrochlorothiazide (DIOVAN-HCT)  320-25 MG per tablet Take 1 tablet by mouth daily. 05/25/12  Yes Lodema Pilot, PA-C  warfarin (COUMADIN) 5 MG tablet TAKE 1 TO 2 TABLETS DAILY AS DIRECTED BY ANTICOAGULATION CLINIC 07/04/12  Yes Chipper Herb, MD  colchicine 0.6 MG tablet Take 1 tablet (0.6 mg total) by mouth daily. 08/30/12   Vernie Shanks, MD    ROS: As above in the HPI. All other systems are stable or negative.  OBJECTIVE: APPEARANCE:  Patient in no acute distress.The patient appeared well nourished and normally developed. Acyanotic. Waist: 53 inches VITAL SIGNS:BP 139/75  Pulse 71  Temp(Src) 97.7 F (36.5 C) (Oral)  Wt 291 lb 1.6 oz (132.042 kg)  BMI 51.58 kg/m2 Morbidly WM  SKIN: warm and  Dry without overt rashes, tattoos and scars  HEAD and Neck: without JVD, Head and scalp: normal Eyes:No scleral icterus. Fundi normal, eye movements normal. Ears: Auricle normal, canal normal, Tympanic membranes normal, insufflation normal. Nose: normal Throat: normal Neck & thyroid: normal  CHEST & LUNGS: Chest wall: normal Lungs: Clear  CVS: Reveals the PMI to be normally located. Regular rhythm, First and Second Heart sounds are normal,  absence of murmurs, rubs or gallops. ABDOMEN:  Appearance: obese Benign, no organomegaly, no masses, no Abdominal Aortic enlargement. No Guarding , no rebound. No Bruits. Bowel sounds: normal  RECTAL: N/A GU: N/A  EXTREMETIES: nonedematous.  MUSCULOSKELETAL:  Spine: normal Joints: intact right foot. Tender the 5 th metattarso phalangeal joint  NEUROLOGIC: oriented to time,place and person; nonfocal. Strength is normal Sensory is normal Reflexes are normal Cranial Nerves are normal.  ASSESSMENT: A-fib  Diabetes 1.5, managed as type 2 - Plan: POCT glycosylated hemoglobin (Hb A1C), CMP14+EGFR, Microalbumin, urine, CANCELED: POCT UA - Microalbumin  Gout - Plan: Uric acid  HTN (hypertension) - Plan: CMP14+EGFR  Other and unspecified hyperlipidemia - Plan:  CMP14+EGFR, NMR, lipoprofile  Personal history of malignant neoplasm of unspecified site in gastrointestinal tract   PLAN: Orders Placed This Encounter  Procedures  . CMP14+EGFR  . NMR, lipoprofile  . Uric acid  . Microalbumin, urine  . POCT glycosylated hemoglobin (Hb A1C)    Meds ordered this encounter  Medications  . colchicine 0.6 MG tablet    Sig: Take 1 tablet (0.6 mg total) by mouth daily.    Dispense:  30 tablet    Refill:  1    Results for orders placed in visit on 08/30/12  POCT GLYCOSYLATED HEMOGLOBIN (HGB A1C)      Result Value Range   Hemoglobin A1C 7.3%           Dr Paula Libra Recommendations  For nutrition information, I recommend books:  1).Eat to Live by Dr Excell Seltzer. 2).Prevent and Reverse Heart Disease  by Dr Karl Luke. 3) Dr Janene Harvey Book:  Program to Reverse Diabetes  Exercise recommendations are:  If unable to walk, then the patient can exercise in a chair 3 times a day. By flapping arms like a bird gently and raising legs outwards to the front.  If ambulatory, the patient can go for walks for 30 minutes 3 times a week. Then increase the intensity and duration as tolerated.  Goal is to try to attain exercise frequency to 5 times a week.  If applicable: Best to perform resistance exercises (machines or weights) 2 days a week and cardio type exercises 3 days per week.  Return in about 3 months (around 11/30/2012) for Recheck medical problems.  Mcarthur Ivins P. Jacelyn Grip, M.D.

## 2012-08-30 NOTE — Patient Instructions (Addendum)
      Dr Sheilah Rayos's Recommendations  For nutrition information, I recommend books:  1).Eat to Live by Dr Joel Fuhrman. 2).Prevent and Reverse Heart Disease by Dr Caldwell Esselstyn. 3) Dr Neal Barnard's Book:  Program to Reverse Diabetes  Exercise recommendations are:  If unable to walk, then the patient can exercise in a chair 3 times a day. By flapping arms like a bird gently and raising legs outwards to the front.  If ambulatory, the patient can go for walks for 30 minutes 3 times a week. Then increase the intensity and duration as tolerated.  Goal is to try to attain exercise frequency to 5 times a week.  If applicable: Best to perform resistance exercises (machines or weights) 2 days a week and cardio type exercises 3 days per week.  

## 2012-08-30 NOTE — Patient Instructions (Signed)
Anticoagulation Dose Instructions as of 08/30/2012     William Flores Tue Wed Thu Fri Sat   New Dose 7.5 mg 10 mg 7.5 mg 7.5 mg 7.5 mg 10 mg 7.5 mg    Description       Continue 53m Mondays and Fridays and 7.529mall other days.       INR was 2.8 today

## 2012-08-31 LAB — CMP14+EGFR
ALT: 23 IU/L (ref 0–44)
AST: 23 IU/L (ref 0–40)
Albumin/Globulin Ratio: 1.4 (ref 1.1–2.5)
Albumin: 3.8 g/dL (ref 3.6–4.8)
Alkaline Phosphatase: 68 IU/L (ref 39–117)
BUN/Creatinine Ratio: 17 (ref 10–22)
BUN: 17 mg/dL (ref 8–27)
CO2: 23 mmol/L (ref 18–29)
Calcium: 8.9 mg/dL (ref 8.6–10.2)
Chloride: 98 mmol/L (ref 97–108)
Creatinine, Ser: 1 mg/dL (ref 0.76–1.27)
GFR calc Af Amer: 94 mL/min/{1.73_m2} (ref >59)
GFR calc non Af Amer: 81 mL/min/{1.73_m2} (ref >59)
Globulin, Total: 2.7 g/dL (ref 1.5–4.5)
Glucose: 185 mg/dL — ABNORMAL HIGH (ref 65–99)
Potassium: 4 mmol/L (ref 3.5–5.2)
Sodium: 140 mmol/L (ref 134–144)
Total Bilirubin: 0.5 mg/dL (ref 0.0–1.2)
Total Protein: 6.5 g/dL (ref 6.0–8.5)

## 2012-08-31 LAB — URIC ACID: Uric Acid: 5.9 mg/dL (ref 3.7–8.6)

## 2012-08-31 LAB — NMR, LIPOPROFILE
Cholesterol: 119 mg/dL (ref <200)
HDL Cholesterol by NMR: 27 mg/dL — ABNORMAL LOW (ref >=40)
HDL Particle Number: 23.8 umol/L — ABNORMAL LOW (ref >=30.5)
LDL Particle Number: 1107 nmol/L — ABNORMAL HIGH (ref <1000)
LDL Size: 20.3 nm — ABNORMAL LOW (ref >20.5)
LDLC SERPL CALC-MCNC: 64 mg/dL (ref <100)
LP-IR Score: 68 — ABNORMAL HIGH (ref <=45)
Small LDL Particle Number: 709 nmol/L — ABNORMAL HIGH (ref <=527)
Triglycerides by NMR: 141 mg/dL (ref <150)

## 2012-08-31 LAB — MICROALBUMIN, URINE: Microalbumin, Urine: 2107.8 ug/mL — ABNORMAL HIGH (ref 0.0–17.0)

## 2012-09-13 ENCOUNTER — Ambulatory Visit (INDEPENDENT_AMBULATORY_CARE_PROVIDER_SITE_OTHER): Payer: BC Managed Care – PPO | Admitting: Pharmacist

## 2012-09-13 VITALS — BP 130/62 | Wt 282.0 lb

## 2012-09-13 DIAGNOSIS — E139 Other specified diabetes mellitus without complications: Secondary | ICD-10-CM

## 2012-09-13 MED ORDER — ONETOUCH DELICA LANCETS 33G MISC: 1.0000 | Freq: Every day | Status: DC

## 2012-09-13 MED ORDER — GLUCOSE BLOOD VI STRP: ORAL_STRIP | Status: DC

## 2012-09-13 NOTE — Progress Notes (Signed)
Gave patient glucometer and taught to use.  No charge for this appt - will RTC 9/28 for follow up.

## 2012-09-21 ENCOUNTER — Ambulatory Visit (HOSPITAL_COMMUNITY)
Admission: RE | Admit: 2012-09-21 | Discharge: 2012-09-21 | Disposition: A | Discharge status: Home / Self Care | Payer: BC Managed Care – PPO | Source: Ambulatory Visit | Attending: Gastroenterology | Admitting: Gastroenterology

## 2012-09-21 ENCOUNTER — Encounter (HOSPITAL_COMMUNITY): Payer: Self-pay

## 2012-09-21 ENCOUNTER — Encounter (HOSPITAL_COMMUNITY)
Admission: RE | Disposition: A | Discharge status: Home / Self Care | Payer: Self-pay | Source: Ambulatory Visit | Attending: Gastroenterology

## 2012-09-21 DIAGNOSIS — I10 Essential (primary) hypertension: Secondary | ICD-10-CM | POA: Insufficient documentation

## 2012-09-21 DIAGNOSIS — Z85038 Personal history of other malignant neoplasm of large intestine: Secondary | ICD-10-CM | POA: Insufficient documentation

## 2012-09-21 DIAGNOSIS — Z1211 Encounter for screening for malignant neoplasm of colon: Secondary | ICD-10-CM

## 2012-09-21 DIAGNOSIS — D126 Benign neoplasm of colon, unspecified: Secondary | ICD-10-CM | POA: Insufficient documentation

## 2012-09-21 DIAGNOSIS — I4891 Unspecified atrial fibrillation: Secondary | ICD-10-CM | POA: Insufficient documentation

## 2012-09-21 DIAGNOSIS — G4730 Sleep apnea, unspecified: Secondary | ICD-10-CM | POA: Insufficient documentation

## 2012-09-21 DIAGNOSIS — Z7982 Long term (current) use of aspirin: Secondary | ICD-10-CM | POA: Insufficient documentation

## 2012-09-21 DIAGNOSIS — Z09 Encounter for follow-up examination after completed treatment for conditions other than malignant neoplasm: Secondary | ICD-10-CM | POA: Insufficient documentation

## 2012-09-21 DIAGNOSIS — K635 Polyp of colon: Secondary | ICD-10-CM

## 2012-09-21 DIAGNOSIS — Z79899 Other long term (current) drug therapy: Secondary | ICD-10-CM | POA: Insufficient documentation

## 2012-09-21 DIAGNOSIS — Z7901 Long term (current) use of anticoagulants: Secondary | ICD-10-CM | POA: Insufficient documentation

## 2012-09-21 DIAGNOSIS — E119 Type 2 diabetes mellitus without complications: Secondary | ICD-10-CM | POA: Insufficient documentation

## 2012-09-21 HISTORY — DX: Polyp of colon: K63.5

## 2012-09-21 HISTORY — PX: COLONOSCOPY: SHX5424

## 2012-09-21 LAB — GLUCOSE, CAPILLARY: Glucose-Capillary: 125 mg/dL — ABNORMAL HIGH (ref 70–99)

## 2012-09-21 SURGERY — COLONOSCOPY
Anesthesia: Moderate Sedation

## 2012-09-21 MED ORDER — MIDAZOLAM HCL 5 MG/5ML IJ SOLN
INTRAMUSCULAR | Status: DC | PRN
  Administered 2012-09-21 (×3): 2 mg via INTRAVENOUS

## 2012-09-21 MED ORDER — FENTANYL CITRATE 0.05 MG/ML IJ SOLN
INTRAMUSCULAR | Status: AC
  Filled 2012-09-21: qty 2

## 2012-09-21 MED ORDER — MIDAZOLAM HCL 10 MG/2ML IJ SOLN
INTRAMUSCULAR | Status: AC
  Filled 2012-09-21: qty 2

## 2012-09-21 MED ORDER — FENTANYL CITRATE 0.05 MG/ML IJ SOLN
INTRAMUSCULAR | Status: DC | PRN
  Administered 2012-09-21 (×3): 25 ug via INTRAVENOUS

## 2012-09-21 MED ORDER — SODIUM CHLORIDE 0.9 % IV SOLN: INTRAVENOUS | Status: DC

## 2012-09-21 NOTE — H&P (Signed)
History of Present Illness: Pleasant 62 year old white male with history of colon cancer, atrial fibrillation, on Coumadin, referred at the request of Dr. Laurance Flatten for colonoscopy. Colon cancer was resected about 10-12 years ago. Last colonoscopy was at least 6 years ago. At one point he had a what sounds post polypectomy bleed. He currently has no GI complaints including change of bowel habits, abdominal pain, melena or hematochezia.  Past Medical History   Diagnosis  Date   .  Diabetes    .  Colon cancer    .  Hypertension    .  Sleep apnea     Past Surgical History   Procedure  Laterality  Date   .  Colon resection     family history includes Breast cancer in his mother and Diabetes in his mother.  Current Outpatient Prescriptions   Medication  Sig  Dispense  Refill   .  allopurinol (ZYLOPRIM) 100 MG tablet  Take 1 tablet (100 mg total) by mouth daily. Take 1 tablet by mouth daily along with the 367m  30 tablet  0   .  allopurinol (ZYLOPRIM) 300 MG tablet  Take 1 tablet (300 mg total) by mouth daily.  30 tablet  3   .  aspirin 81 MG tablet  Take 81 mg by mouth daily.     .Marland Kitchen atorvastatin (LIPITOR) 20 MG tablet  Take 1 tablet (20 mg total) by mouth daily.  30 tablet  3   .  diltiazem (TIAZAC) 240 MG 24 hr capsule  Take 1 capsule (240 mg total) by mouth daily.  30 capsule  3   .  lisinopril (PRINIVIL,ZESTRIL) 20 MG tablet  Take 1 tablet (20 mg total) by mouth daily.  30 tablet  3   .  sitaGLIPtan-metformin (JANUMET) 50-1000 MG per tablet  Take 1 tablet by mouth 2 (two) times daily with a meal.  60 tablet  3   .  valsartan-hydrochlorothiazide (DIOVAN-HCT) 320-25 MG per tablet  Take 1 tablet by mouth daily.  30 tablet  3   .  warfarin (COUMADIN) 5 MG tablet  Take 1 to 2 tablets by mouth daily as directed by anticoag clinic  60 tablet  2    No current facility-administered medications for this visit.    Allergies as of 06/20/2012 - Review Complete 06/20/2012   Allergen  Reaction  Noted   .   Codeine   03/29/2012   reports that he has never smoked. He has never used smokeless tobacco. He reports that he does not drink alcohol or use illicit drugs.  Review of Systems: Pertinent positive and negative review of systems were noted in the above HPI section. All other review of systems were otherwise negative.  Vital signs were reviewed in today's medical record  Physical Exam:  General: He is an obese male in no acute distress  Skin: anicteric  Head: Normocephalic and atraumatic  Eyes: sclerae anicteric, EOMI  Ears: Normal auditory acuity  Mouth: No deformity or lesions  Neck: Supple, no masses or thyromegaly  Lungs: Clear throughout to auscultation  Heart: Regular rate and rhythm; no murmurs, rubs or bruits  Abdomen: Soft, non tender and non distended. No masses, hepatosplenomegaly or hernias noted. Normal Bowel sounds  Rectal:deferred  Musculoskeletal: Symmetrical with no gross deformities  Skin: No lesions on visible extremities  Pulses: Normal pulses noted  Extremities: There are chronic venous stasis changes bilaterally in the lower extremities Neurological: Alert oriented x 4, grossly nonfocal  Cervical  Nodes: No significant cervical adenopathy  Inguinal Nodes: No significant inguinal adenopathy  Psychological: Alert and cooperative. Normal mood and affect  Imp - Personal history of malignant neoplasm of unspecified site in gastrointestinal tract - Inda Castle, MD at 06/20/2012 2:14 PM   Status: Written Related Problem: Personal history of malignant neoplasm of unspecified site in gastrointestinal tract        Patient is due for followup colonoscopy. I'll check with his PCP whether Coumadin can be held.

## 2012-09-21 NOTE — Op Note (Signed)
[facility] [place], [address]   COLONOSCOPY PROCEDURE REPORT  PATIENT: William Flores, William Flores  MR#: [id number] BIRTHDATE: 03-27-1950 , 62  yrs. old GENDER: Male ENDOSCOPIST: Inda Castle, MD REFERRED XY:VOPFYT Laurance Flatten, M.D. PROCEDURE DATE:  09/21/2012 PROCEDURE:   Colonoscopy with snare polypectomy ASA CLASS:   Class II INDICATIONS:Patient's personal history of adenomatous colon polyps and High risk patient with personal history of colon cancer. MEDICATIONS: These medications were titrated to patient response per physician's verbal order, Versed 6 mg IV, and Fentanyl 75 mcg IV  DESCRIPTION OF PROCEDURE:   After the risks benefits and alternatives of the procedure were thoroughly explained, informed consent was obtained.  A digital rectal exam revealed no abnormalities of the rectum.   The Pentax Ped Colon [id number] endoscope was introduced through the anus and advanced to the cecum, which was identified by both the appendix and ileocecal valve. No adverse events experienced.   The quality of the prep was Suprep fair  The instrument was then slowly withdrawn as the colon was fully examined. Photodocumentation not available     COLON FINDINGS: A sessile polyp measuring 6 mm in size with a friable surface was found in the descending colon.  A polypectomy was performed using snare cautery. The polyp was initially removed with the cold polypectomy snare.  There was a polyp remnants at the base with a moderate amount of bleeding.  This was removed with a hot polypectomy snare and submitted to pathology. The resection was complete and the polyp tissue was completely retrieved.   The colon mucosa was otherwise normal.  Retroflexed views revealed no abnormalities. The time to cecum=  .  Withdrawal time=21 minutes 0 seconds.  The scope was withdrawn and the procedure completed. COMPLICATIONS: There were no complications.  ENDOSCOPIC IMPRESSION: 1.    Sessile polyp measuring 6 mm in size was found in the descending colon; polypectomy was performed using snare cautery 2.   The colon mucosa was otherwise normal  RECOMMENDATIONS: If the polyp(s) removed today are proven to be adenomatous (pre-cancerous) polyps, you will need a repeat colonoscopy in 5 years.  Otherwise you should continue to follow colorectal cancer screening guidelines for "routine risk" patients with colonoscopy in 10 years.  You will receive a letter within 1-2 weeks with the results of your biopsy as well as final recommendations.  Please call my office if you have not received a letter after 3 weeks.   eSigned:  Inda Castle, MD 09/21/2012 1:22 PM   cc:

## 2012-09-24 ENCOUNTER — Encounter (HOSPITAL_COMMUNITY): Payer: Self-pay | Admitting: Gastroenterology

## 2012-10-01 ENCOUNTER — Encounter: Payer: Self-pay | Admitting: Gastroenterology

## 2012-10-01 ENCOUNTER — Ambulatory Visit (INDEPENDENT_AMBULATORY_CARE_PROVIDER_SITE_OTHER): Payer: BC Managed Care – PPO | Admitting: Pharmacist

## 2012-10-01 VITALS — Ht 63.0 in | Wt 283.0 lb

## 2012-10-01 DIAGNOSIS — I10 Essential (primary) hypertension: Secondary | ICD-10-CM

## 2012-10-01 DIAGNOSIS — I4891 Unspecified atrial fibrillation: Secondary | ICD-10-CM

## 2012-10-01 DIAGNOSIS — E139 Other specified diabetes mellitus without complications: Secondary | ICD-10-CM

## 2012-10-01 DIAGNOSIS — M109 Gout, unspecified: Secondary | ICD-10-CM

## 2012-10-01 LAB — POCT INR: INR: 2.9

## 2012-10-01 MED ORDER — ALLOPURINOL 300 MG PO TABS: 300.0000 mg | ORAL_TABLET | Freq: Every day | ORAL | Status: DC

## 2012-10-01 MED ORDER — LISINOPRIL 20 MG PO TABS: 20.0000 mg | ORAL_TABLET | Freq: Every day | ORAL | Status: DC

## 2012-10-01 MED ORDER — VALSARTAN-HYDROCHLOROTHIAZIDE 320-25 MG PO TABS: 1.0000 | ORAL_TABLET | Freq: Every day | ORAL | Status: DC

## 2012-10-01 NOTE — Patient Instructions (Signed)
Anticoagulation Dose Instructions as of 10/01/2012     Dorene Grebe Tue Wed Thu Fri Sat   New Dose 7.5 mg 10 mg 7.5 mg 7.5 mg 7.5 mg 10 mg 7.5 mg    Description       Continue 70m Mondays and Fridays and 7.574mall other days.        INR was 2.9 today

## 2012-10-05 ENCOUNTER — Other Ambulatory Visit: Payer: Self-pay | Admitting: Physician Assistant

## 2012-10-09 ENCOUNTER — Other Ambulatory Visit: Payer: Self-pay | Admitting: Physician Assistant

## 2012-10-19 ENCOUNTER — Other Ambulatory Visit: Payer: Self-pay | Admitting: Physician Assistant

## 2012-11-01 ENCOUNTER — Ambulatory Visit (INDEPENDENT_AMBULATORY_CARE_PROVIDER_SITE_OTHER): Payer: BC Managed Care – PPO | Admitting: Pharmacist

## 2012-11-01 ENCOUNTER — Other Ambulatory Visit: Payer: Self-pay | Admitting: Physician Assistant

## 2012-11-01 DIAGNOSIS — I4891 Unspecified atrial fibrillation: Secondary | ICD-10-CM

## 2012-11-01 DIAGNOSIS — Z23 Encounter for immunization: Secondary | ICD-10-CM

## 2012-11-01 NOTE — Patient Instructions (Signed)
Anticoagulation Dose Instructions as of 11/01/2012     Dorene Grebe Tue Wed Thu Fri Sat   New Dose 7.5 mg 10 mg 7.5 mg 7.5 mg 7.5 mg 10 mg 7.5 mg    Description       Continue 88m Mondays and Fridays and 7.534mall other days.       INR was 2.3 today

## 2012-11-08 ENCOUNTER — Other Ambulatory Visit: Payer: Self-pay | Admitting: Family Medicine

## 2012-12-20 ENCOUNTER — Ambulatory Visit (INDEPENDENT_AMBULATORY_CARE_PROVIDER_SITE_OTHER): Payer: BC Managed Care – PPO | Admitting: Family Medicine

## 2012-12-20 ENCOUNTER — Encounter: Payer: Self-pay | Admitting: Family Medicine

## 2012-12-20 VITALS — BP 138/81 | HR 73 | Temp 99.1°F | Ht 63.0 in | Wt 283.8 lb

## 2012-12-20 DIAGNOSIS — B352 Tinea manuum: Secondary | ICD-10-CM | POA: Insufficient documentation

## 2012-12-20 DIAGNOSIS — E785 Hyperlipidemia, unspecified: Secondary | ICD-10-CM

## 2012-12-20 DIAGNOSIS — B353 Tinea pedis: Secondary | ICD-10-CM

## 2012-12-20 DIAGNOSIS — I10 Essential (primary) hypertension: Secondary | ICD-10-CM

## 2012-12-20 DIAGNOSIS — B351 Tinea unguium: Secondary | ICD-10-CM | POA: Insufficient documentation

## 2012-12-20 DIAGNOSIS — I4891 Unspecified atrial fibrillation: Secondary | ICD-10-CM

## 2012-12-20 DIAGNOSIS — E139 Other specified diabetes mellitus without complications: Secondary | ICD-10-CM

## 2012-12-20 DIAGNOSIS — D126 Benign neoplasm of colon, unspecified: Secondary | ICD-10-CM

## 2012-12-20 DIAGNOSIS — E119 Type 2 diabetes mellitus without complications: Secondary | ICD-10-CM

## 2012-12-20 DIAGNOSIS — M109 Gout, unspecified: Secondary | ICD-10-CM

## 2012-12-20 LAB — POCT UA - MICROALBUMIN: Microalbumin Ur, POC: POSITIVE mg/L

## 2012-12-20 LAB — POCT INR: INR: 1.9

## 2012-12-20 LAB — POCT GLYCOSYLATED HEMOGLOBIN (HGB A1C): Hemoglobin A1C: 6.7

## 2012-12-20 NOTE — Progress Notes (Signed)
Patient ID: William Flores, male   DOB: Mar 05, 1950, 62 y.o.   MRN: [id number] SUBJECTIVE: CC: Chief Complaint  Patient presents with  . Follow-up    3 month chronic health    HPI:  Patient is here for follow up of Diabetes Mellitus/HTN/Gout/: Symptoms evaluated: Denies Nocturia ,Denies Urinary Frequency , denies Blurred vision ,deniesDizziness,denies.Dysuria,denies paresthesias, denies extremity pain or ulcers.Marland Kitchendenies chest pain. has had an annual eye exam. do check the feet. Does check CBGs. Average CBG:not checked regularly Denies episodes of hypoglycemia. Does have an emergency hypoglycemic plan. admits toCompliance with medications. Denies Problems with medications.   No gout flare  Past Medical History  Diagnosis Date  . Diabetes   . Colon cancer   . Hypertension   . Sleep apnea   . Atrial fibrillation   . Hyperlipidemia    Past Surgical History  Procedure Laterality Date  . Colon resection    . Circumcision    . Colonoscopy N/A 09/21/2012    Procedure: COLONOSCOPY;  Surgeon: Inda Castle, MD;  Location: [facility];  Service: Endoscopy;  Laterality: N/A;   History   Social History  . Marital Status: Divorced    Spouse Name: N/A    Number of Children: 1  . 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 Smoking status: Never Smoker   . Smokeless tobacco: Never Used  . Alcohol Use: No  . Drug Use: No  . Sexual Activity: Not on file   Other Topics Concern  . Not on file   Social History Narrative  . No narrative on file   Family History  Problem Relation Age of Onset  . Breast cancer Mother   . Diabetes Mother    Current Outpatient Prescriptions on File Prior to Visit  Medication Sig Dispense Refill  . allopurinol (ZYLOPRIM) 100 MG tablet TAKE 1 TABLET (100 MG TOTAL) BY MOUTH DAILY.  30 tablet  3  . allopurinol (ZYLOPRIM) 300 MG tablet Take 1 tablet (300 mg total) by mouth daily. Also  take 190m daily to equal 4045mtotal.  30 tablet  2  . aspirin 81 MG tablet Take 81 mg by mouth daily.      . Marland Kitchentorvastatin (LIPITOR) 20 MG tablet TAKE 1 TABLET (20 MG TOTAL) BY MOUTH DAILY.  30 tablet  3  . diltiazem (TIAZAC) 240 MG 24 hr capsule TAKE 1 CAPSULE DAILY  30 capsule  3  . glucose blood test strip Use to check BG daily  100 each  2  . lisinopril (PRINIVIL,ZESTRIL) 20 MG tablet Take 1 tablet (20 mg total) by mouth daily.  30 tablet  2  . ONETOUCH DELICA LANCETS 3361WISC 1 each by Does not apply route daily. Use one lancet to check BG daily  100 each  2  . sitaGLIPtan-metformin (JANUMET) 50-1000 MG per tablet Take 1 tablet by mouth 2 (two) times daily with a meal.  60 tablet  3  . valsartan-hydrochlorothiazide (DIOVAN-HCT) 320-25 MG per tablet Take 1 tablet by mouth daily.  30 tablet  2   No current facility-administered medications on file prior to visit.   Allergies  Allergen Reactions  . Codeine     Headache    Immunization History  Administered Date(s) Administered  . Influenza,inj,Quad PF,36+ Mos 11/01/2012   Prior to Admission medications   Medication Sig Start Date End Date Taking? Authorizing Provider  allopurinol (ZYLOPRIM) 100 MG tablet TAKE 1  TABLET (100 MG TOTAL) BY MOUTH DAILY. 11/01/12  Yes Vernie Shanks, MD  allopurinol (ZYLOPRIM) 300 MG tablet Take 1 tablet (300 mg total) by mouth daily. Also take 148m daily to equal 402mtotal. 10/01/12  Yes FrVernie ShanksMD  aspirin 81 MG tablet Take 81 mg by mouth daily.   Yes Historical Provider, MD  atorvastatin (LIPITOR) 20 MG tablet TAKE 1 TABLET (20 MG TOTAL) BY MOUTH DAILY. 10/05/12  Yes FrVernie ShanksMD  colchicine 0.6 MG tablet Take 1 tablet (0.6 mg total) by mouth daily. 08/30/12  Yes FrVernie ShanksMD  diltiazem ([facility] MG 24 hr capsule TAKE 1 CAPSULE DAILY 10/19/12  Yes FrVernie ShanksMD  glucose blood test strip Use to check BG daily 09/13/12  Yes Tammy Eckard, PHARMD  lisinopril (PRINIVIL,ZESTRIL) 20  MG tablet Take 1 tablet (20 mg total) by mouth daily. 10/01/12  Yes FrVernie ShanksMD  [facility] CenterEL[facility] LANCETS 3337TISC 1 each by Does not apply route daily. Use one lancet to check BG daily 09/13/12  Yes Tammy Eckard, PHARMD  sitaGLIPtan-metformin (JANUMET) 50-1000 MG per tablet Take 1 tablet by mouth 2 (two) times daily with a meal. 05/25/12  Yes WiLodema PilotPA-C  valsartan-hydrochlorothiazide (DIOVAN-HCT) 320-25 MG per tablet Take 1 tablet by mouth daily. 10/01/12  Yes FrVernie ShanksMD  warfarin (COUMADIN) 5 MG tablet TAKE 1 TO 2 TABLETS DAILY AS DIRECTED BY ANTICOAGULATION CLINIC 11/08/12  Yes DoChipper HerbMD  allopurinol (ZYLOPRIM) 100 MG tablet Take 1 tablet (100 mg total) by mouth daily. Take 1 tablet by mouth daily along with the 3002m/10/14   WilLodema PilotA-C  valsartan-hydrochlorothiazide (DIOVAN-HCT) 320-25 MG per tablet TAKE 1 TABLET BY MOUTH DAILY. 10/05/12   FraVernie ShanksD     ROS: As above in the HPI. All other systems are stable or negative.  OBJECTIVE: APPEARANCE:  Patient in no acute distress.The patient appeared well nourished and normally developed. Acyanotic. Waist: VITAL SIGNS:BP 138/81  Pulse 73  Temp(Src) 99.1 F (37.3 C) (Oral)  Ht 5' 3"  (1.6 m)  Wt 283 lb 12.8 oz (128.731 kg)  BMI 50.29 kg/m2  Morbidly Obese WM  SKIN: warm and  Dry without overt rashes, tattoos and scars. Toenails are all abnormal thick and white or yellow from tinea unguum.  HEAD and Neck: without JVD, Head and scalp: normal Eyes:No scleral icterus. Fundi normal, eye movements normal. Ears: Auricle normal, canal normal, Tympanic membranes normal, insufflation normal. Nose: normal Throat: normal Neck & thyroid: normal  CHEST & LUNGS: Chest wall: normal Lungs: Clear  CVS: Reveals the PMI to be normally located. Regular rhythm, First and Second Heart sounds are normal,  absence of murmurs, rubs or gallops. Peripheral vasculature: Radial pulses: normal Dorsal  pedis pulses: normal Posterior pulses: normal  ABDOMEN:  Appearance: obese Benign, no organomegaly, no masses, no Abdominal Aortic enlargement. No Guarding , no rebound. No Bruits. Bowel sounds: normal  RECTAL: N/A GU: N/A  EXTREMETIES: nonedematous.  MUSCULOSKELETAL:  Spine: normal Joints: intact  NEUROLOGIC: oriented to time,place and person; nonfocal. Strength is normal Sensory is normal Reflexes are normal Cranial Nerves are normal.  ASSESSMENT: A-fib - Plan: POCT INR  Diabetes 1.5, managed as type 2 - Plan: POCT glycosylated hemoglobin (Hb A1C), POCT UA - Microalbumin, CMP14+EGFR, Microalbumin, urine  HTN (hypertension) - Plan: CMP14+EGFR  Other and unspecified hyperlipidemia - Plan: NMR, lipoprofile  Gout - Plan: Uric acid  Benign neoplasm  of colon  Hyperlipidemia  Tinea manuum, pedis, and unguium  PLAN:       Dr Paula Libra Recommendations  For nutrition information, I recommend books:  1).Eat to Live by Dr Excell Seltzer. 2).Prevent and Reverse Heart Disease by Dr Karl Luke. 3) Dr Janene Harvey Book:  Program to Reverse Diabetes  Exercise recommendations are:  If unable to walk, then the patient can exercise in a chair 3 times a day. By flapping arms like a bird gently and raising legs outwards to the front.  If ambulatory, the patient can go for walks for 30 minutes 3 times a week. Then increase the intensity and duration as tolerated.  Goal is to try to attain exercise frequency to 5 times a week.  If applicable: Best to perform resistance exercises (machines or weights) 2 days a week and cardio type exercises 3 days per week.   Patient was to bring medications tomorrow, however he found his list and his medication list was adjusted.  Results for orders placed in visit on 12/20/12  POCT INR      Result Value Range   INR 1.9    POCT GLYCOSYLATED HEMOGLOBIN (HGB A1C)      Result Value Range   Hemoglobin A1C 6.7%    POCT UA -  MICROALBUMIN      Result Value Range   Microalbumin Ur, POC pos+ 42m/l      Orders Placed This Encounter  Procedures  . CMP14+EGFR  . NMR, lipoprofile  . Uric acid  . Microalbumin, urine  . POCT INR  . POCT glycosylated hemoglobin (Hb A1C)  . POCT UA - Microalbumin   Meds ordered this encounter  Medications  . warfarin (COUMADIN) 5 MG tablet    Sig: Mondays and Fridays take 2 tabs and 1  1/2 all other days   No change in medications. Medications Discontinued During This Encounter  Medication Reason  . valsartan-hydrochlorothiazide (DIOVAN-HCT) 320-25 MG per tablet Error  . warfarin (COUMADIN) 5 MG tablet   . allopurinol (ZYLOPRIM) 100 MG tablet Error  . colchicine 0.6 MG tablet Error   Return in about 2 weeks (around 01/03/2013) for recheck protime, 3 months routine..Dub MikesP. WJacelyn Grip M.D.

## 2012-12-20 NOTE — Patient Instructions (Signed)
      Dr Paula Libra Recommendations  For nutrition information, I recommend books:  1).Eat to Live by Dr Excell Seltzer. 2).Prevent and Reverse Heart Disease by Dr Karl Luke. 3) Dr Janene Harvey Book:  Program to Reverse Diabetes  Exercise recommendations are:  If unable to walk, then the patient can exercise in a chair 3 times a day. By flapping arms like a bird gently and raising legs outwards to the front.  If ambulatory, the patient can go for walks for 30 minutes 3 times a week. Then increase the intensity and duration as tolerated.  Goal is to try to attain exercise frequency to 5 times a week.  If applicable: Best to perform resistance exercises (machines or weights) 2 days a week and cardio type exercises 3 days per week.   Return with medications tomorrow to check with the medication list.

## 2012-12-21 LAB — MICROALBUMIN, URINE: Microalbumin, Urine: 1446.7 ug/mL — ABNORMAL HIGH (ref 0.0–17.0)

## 2012-12-22 LAB — CMP14+EGFR
ALT: 30 IU/L (ref 0–44)
AST: 31 IU/L (ref 0–40)
Albumin/Globulin Ratio: 1.6 (ref 1.1–2.5)
Albumin: 4.2 g/dL (ref 3.6–4.8)
Alkaline Phosphatase: 72 IU/L (ref 39–117)
BUN/Creatinine Ratio: 22 (ref 10–22)
BUN: 25 mg/dL (ref 8–27)
CO2: 19 mmol/L (ref 18–29)
Calcium: 9.7 mg/dL (ref 8.6–10.2)
Chloride: 103 mmol/L (ref 97–108)
Creatinine, Ser: 1.15 mg/dL (ref 0.76–1.27)
GFR calc Af Amer: 80 mL/min/{1.73_m2} (ref >59)
GFR calc non Af Amer: 69 mL/min/{1.73_m2} (ref >59)
Globulin, Total: 2.6 g/dL (ref 1.5–4.5)
Glucose: 108 mg/dL — ABNORMAL HIGH (ref 65–99)
Potassium: 4.2 mmol/L (ref 3.5–5.2)
Sodium: 147 mmol/L — ABNORMAL HIGH (ref 134–144)
Total Bilirubin: 0.4 mg/dL (ref 0.0–1.2)
Total Protein: 6.8 g/dL (ref 6.0–8.5)

## 2012-12-22 LAB — NMR, LIPOPROFILE
Cholesterol: 129 mg/dL (ref <200)
HDL Cholesterol by NMR: 30 mg/dL — ABNORMAL LOW (ref >=40)
HDL Particle Number: 24.4 umol/L — ABNORMAL LOW (ref >=30.5)
LDL Particle Number: 1243 nmol/L — ABNORMAL HIGH (ref <1000)
LDL Size: 19.7 nm — ABNORMAL LOW (ref >20.5)
LDLC SERPL CALC-MCNC: 70 mg/dL (ref <100)
LP-IR Score: 71 — ABNORMAL HIGH (ref <=45)
Small LDL Particle Number: 1010 nmol/L — ABNORMAL HIGH (ref <=527)
Triglycerides by NMR: 144 mg/dL (ref <150)

## 2012-12-22 LAB — URIC ACID: Uric Acid: 6.4 mg/dL (ref 3.7–8.6)

## 2012-12-24 ENCOUNTER — Telehealth: Payer: Self-pay | Admitting: Pharmacist

## 2012-12-24 NOTE — Telephone Encounter (Signed)
Patient needed F/U appt for INR - appt made for 01/14/13 at 3:40.  Pt aware.

## 2012-12-31 ENCOUNTER — Telehealth: Payer: Self-pay | Admitting: Family Medicine

## 2013-01-02 ENCOUNTER — Other Ambulatory Visit: Payer: Self-pay | Admitting: Family Medicine

## 2013-01-14 ENCOUNTER — Ambulatory Visit (INDEPENDENT_AMBULATORY_CARE_PROVIDER_SITE_OTHER): Payer: BC Managed Care – PPO | Admitting: Pharmacist

## 2013-01-14 DIAGNOSIS — I4891 Unspecified atrial fibrillation: Secondary | ICD-10-CM

## 2013-01-14 LAB — POCT INR: INR: 2.3

## 2013-01-14 NOTE — Patient Instructions (Signed)
Anticoagulation Dose Instructions as of 01/14/2013     William Flores Tue Wed Thu Fri Sat   New Dose 7.5 mg 10 mg 7.5 mg 7.5 mg 7.5 mg 10 mg 7.5 mg    Description       Continue 19m Mondays and Fridays and 7.519mall other days.       INR was 2.3 today

## 2013-02-01 ENCOUNTER — Other Ambulatory Visit: Payer: Self-pay | Admitting: Family Medicine

## 2013-02-14 ENCOUNTER — Ambulatory Visit (INDEPENDENT_AMBULATORY_CARE_PROVIDER_SITE_OTHER): Payer: BC Managed Care – PPO | Admitting: Pharmacist

## 2013-02-14 DIAGNOSIS — I4891 Unspecified atrial fibrillation: Secondary | ICD-10-CM

## 2013-02-14 LAB — POCT INR: INR: 1.6

## 2013-02-14 NOTE — Patient Instructions (Signed)
Anticoagulation Dose Instructions as of 02/14/2013     William Flores Tue Wed Thu Fri Sat   New Dose 7.5 mg 10 mg 7.5 mg 7.5 mg 7.5 mg 10 mg 7.5 mg    Description       Take 27m today and tomorrow and this Saturday.   Then continue 14mMondays and Fridays and 7.14m50mll other days.       INR was 1.6 today

## 2013-02-17 ENCOUNTER — Other Ambulatory Visit: Payer: Self-pay | Admitting: Family Medicine

## 2013-03-03 ENCOUNTER — Other Ambulatory Visit: Payer: Self-pay | Admitting: Family Medicine

## 2013-03-05 ENCOUNTER — Other Ambulatory Visit: Payer: Self-pay | Admitting: Family Medicine

## 2013-03-18 ENCOUNTER — Ambulatory Visit (INDEPENDENT_AMBULATORY_CARE_PROVIDER_SITE_OTHER): Payer: BC Managed Care – PPO | Admitting: Pharmacist

## 2013-03-18 DIAGNOSIS — I4891 Unspecified atrial fibrillation: Secondary | ICD-10-CM

## 2013-03-18 LAB — POCT INR: INR: 2.1

## 2013-03-18 NOTE — Patient Instructions (Signed)
Anticoagulation Dose Instructions as of 03/18/2013     Sun Mon Tue Wed Thu Fri Sat   New Dose 7.5 mg 10 mg 7.5 mg 7.5 mg 7.5 mg 10 mg 7.5 mg    Description       Continue 10m Mondays and Fridays and 7.529mall other days.       INR was 2.1 today

## 2013-04-13 ENCOUNTER — Other Ambulatory Visit: Payer: Self-pay | Admitting: Family Medicine

## 2013-04-16 ENCOUNTER — Other Ambulatory Visit: Payer: Self-pay | Admitting: Family Medicine

## 2013-04-18 NOTE — Telephone Encounter (Signed)
Call patient : Prescription refilled & sent to pharmacy in [place].

## 2013-04-19 ENCOUNTER — Ambulatory Visit (INDEPENDENT_AMBULATORY_CARE_PROVIDER_SITE_OTHER): Payer: BC Managed Care – PPO | Admitting: Family Medicine

## 2013-04-19 ENCOUNTER — Encounter: Payer: Self-pay | Admitting: Family Medicine

## 2013-04-19 VITALS — BP 121/71 | HR 99 | Temp 99.1°F | Ht 63.0 in | Wt 294.2 lb

## 2013-04-19 DIAGNOSIS — E119 Type 2 diabetes mellitus without complications: Secondary | ICD-10-CM

## 2013-04-19 DIAGNOSIS — M109 Gout, unspecified: Secondary | ICD-10-CM

## 2013-04-19 DIAGNOSIS — E139 Other specified diabetes mellitus without complications: Secondary | ICD-10-CM

## 2013-04-19 DIAGNOSIS — Z8500 Personal history of malignant neoplasm of unspecified digestive organ: Secondary | ICD-10-CM

## 2013-04-19 DIAGNOSIS — E785 Hyperlipidemia, unspecified: Secondary | ICD-10-CM

## 2013-04-19 DIAGNOSIS — H109 Unspecified conjunctivitis: Secondary | ICD-10-CM

## 2013-04-19 DIAGNOSIS — H1010 Acute atopic conjunctivitis, unspecified eye: Secondary | ICD-10-CM

## 2013-04-19 DIAGNOSIS — H1045 Other chronic allergic conjunctivitis: Secondary | ICD-10-CM

## 2013-04-19 DIAGNOSIS — I10 Essential (primary) hypertension: Secondary | ICD-10-CM

## 2013-04-19 DIAGNOSIS — I4891 Unspecified atrial fibrillation: Secondary | ICD-10-CM

## 2013-04-19 LAB — POCT GLYCOSYLATED HEMOGLOBIN (HGB A1C): Hemoglobin A1C: 7.4

## 2013-04-19 MED ORDER — SULFACETAMIDE SODIUM 10 % OP SOLN: 1.0000 [drp] | OPHTHALMIC | Status: DC

## 2013-04-19 MED ORDER — AZELASTINE HCL 0.05 % OP SOLN: 2.0000 [drp] | Freq: Two times a day (BID) | OPHTHALMIC | Status: DC

## 2013-04-19 NOTE — Progress Notes (Signed)
Patient ID: William Flores, male   DOB: 29-Jan-1950, 63 y.o.   MRN: [id number] SUBJECTIVE: CC: Chief Complaint  Patient presents with  . Follow-up    4 month follow up     HPI: Patient is here for follow up of Diabetes Mellitus: Symptoms evaluated: Denies Nocturia ,Denies Urinary Frequency , denies Blurred vision ,deniesDizziness,denies.Dysuria,denies paresthesias, denies extremity pain or ulcers.Marland Kitchendenies chest pain. has had an annual eye exam. do check the feet. Does check CBGs. Average GHW:EXHBZJI 135 otherwise it has been excellent.  Denies episodes of hypoglycemia. Does have an emergency hypoglycemic plan. admits toCompliance with medications. Denies Problems with medications.   Has allergies affecting his eyes causing them to be  Red and watering.   A Fib: take 10 mg on Mondays and Fridays; then takes 1 1/2 = 7.5 mg all other days. Past Medical History  Diagnosis Date  . Diabetes   . Colon cancer   . Hypertension   . Sleep apnea   . Atrial fibrillation   . Hyperlipidemia    Past Surgical History  Procedure Laterality Date  . Colon resection    . Circumcision    . Colonoscopy N/A 09/21/2012    Procedure: COLONOSCOPY;  Surgeon: Inda Castle, MD;  Location: [facility];  Service: Endoscopy;  Laterality: N/A;   History   Social History  . Marital Status: Divorced    Spouse Name: N/A    Number of Children: 1  . 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 Smoking status: Never Smoker   . Smokeless tobacco: Never Used  . Alcohol Use: No  . Drug Use: No  . Sexual Activity: Not on file   Other Topics Concern  . Not on file   Social History Narrative  . No narrative on file   Family History  Problem Relation Age of Onset  . Breast cancer Mother   . Diabetes Mother    Current Outpatient Prescriptions on File Prior to Visit  Medication Sig Dispense Refill  . allopurinol (ZYLOPRIM) 100 MG tablet  TAKE 1 TABLET (100 MG TOTAL) BY MOUTH DAILY.  30 tablet  1  . allopurinol (ZYLOPRIM) 300 MG tablet TAKE 1 TABLET (300 MG TOTAL) BY MOUTH DAILY. ALSO TAKE 100MG DAILY TO EQUAL 400MG TOTAL.  30 tablet  2  . aspirin 81 MG tablet Take 81 mg by mouth daily.      Marland Kitchen atorvastatin (LIPITOR) 20 MG tablet TAKE 1 TABLET (20 MG TOTAL) BY MOUTH DAILY.  30 tablet  3  . diltiazem (CARDIZEM CD) 240 MG 24 hr capsule TAKE 1 CAPSULE DAILY  30 capsule  3  . glucose blood test strip Use to check BG daily  100 each  2  . lisinopril (PRINIVIL,ZESTRIL) 20 MG tablet TAKE 1 TABLET (20 MG TOTAL) BY MOUTH DAILY.  30 tablet  3  . ONETOUCH DELICA LANCETS 96V MISC 1 each by Does not apply route daily. Use one lancet to check BG daily  100 each  2  . sitaGLIPtan-metformin (JANUMET) 50-1000 MG per tablet Take 1 tablet by mouth 2 (two) times daily with a meal.  60 tablet  3  . valsartan-hydrochlorothiazide (DIOVAN-HCT) 320-25 MG per tablet Take 1 tablet by mouth daily.  30 tablet  2  . warfarin (COUMADIN) 5 MG tablet TAKE 1 TO 2 TABLETS DAILY AS DIRECTED BY ANTICOAGULATION CLINIC  60 tablet  2  . [DISCONTINUED] diltiazem (TIAZAC) 240  MG 24 hr capsule TAKE 1 CAPSULE DAILY  30 capsule  3   No current facility-administered medications on file prior to visit.   Allergies  Allergen Reactions  . Codeine     Headache    Immunization History  Administered Date(s) Administered  . Influenza,inj,Quad PF,36+ Mos 11/01/2012   Prior to Admission medications   Medication Sig Start Date End Date Taking? Authorizing Provider  allopurinol (ZYLOPRIM) 100 MG tablet TAKE 1 TABLET (100 MG TOTAL) BY MOUTH DAILY.    Chipper Herb, MD  allopurinol (ZYLOPRIM) 300 MG tablet TAKE 1 TABLET (300 MG TOTAL) BY MOUTH DAILY. ALSO TAKE 100MG DAILY TO EQUAL 400MG TOTAL.    Vernie Shanks, MD  allopurinol (ZYLOPRIM) 300 MG tablet TAKE 1 TABLET (300 MG TOTAL) BY MOUTH DAILY. ALSO TAKE 100MG DAILY TO EQUAL 400MG TOTAL.    Vernie Shanks, MD  aspirin 81 MG  tablet Take 81 mg by mouth daily.    Historical Provider, MD  atorvastatin (LIPITOR) 20 MG tablet TAKE 1 TABLET (20 MG TOTAL) BY MOUTH DAILY. 02/01/13   Vernie Shanks, MD  diltiazem (CARDIZEM CD) 240 MG 24 hr capsule TAKE 1 CAPSULE DAILY    Vernie Shanks, MD  glucose blood test strip Use to check BG daily 09/13/12   Tammy Eckard, PHARMD  lisinopril (PRINIVIL,ZESTRIL) 20 MG tablet TAKE 1 TABLET (20 MG TOTAL) BY MOUTH DAILY. 02/01/13   Vernie Shanks, MD  [facility] DELICA LANCETS 09X MISC 1 each by Does not apply route daily. Use one lancet to check BG daily 09/13/12   Tammy Eckard, PHARMD  sitaGLIPtan-metformin (JANUMET) 50-1000 MG per tablet Take 1 tablet by mouth 2 (two) times daily with a meal. 05/25/12   Lodema Pilot, PA-C  valsartan-hydrochlorothiazide (DIOVAN-HCT) 320-25 MG per tablet Take 1 tablet by mouth daily. 10/01/12   Vernie Shanks, MD  warfarin (COUMADIN) 5 MG tablet TAKE 1 TO 2 TABLETS DAILY AS DIRECTED BY ANTICOAGULATION CLINIC    Vernie Shanks, MD     ROS: As above in the HPI. All other systems are stable or negative.  OBJECTIVE: APPEARANCE:  Patient in no acute distress.The patient appeared well nourished and normally developed. Acyanotic. Waist: VITAL SIGNS:BP 121/71  Pulse 99  Temp(Src) 99.1 F (37.3 C) (Oral)  Ht 5' 3"  (1.6 m)  Wt 294 lb 3.2 oz (133.448 kg)  BMI 52.13 kg/m2  Obese WM SKIN: warm and  Dry without overt rashes, tattoos and scars  HEAD and Neck: without JVD, Head and scalp: normal Eyes:No scleral icterus., eye movements normal. The left conjunctiva is injected and lacrimating profusely and some crusting on the left eye lashes.  Ears: Auricle normal, canal normal, Tympanic membranes normal, insufflation normal. Nose: normal Throat: normal Neck & thyroid: normal  CHEST & LUNGS: Chest wall: normal Lungs: Clear  CVS: Reveals the PMI to be normally located. Regular rhythm, First and Second Heart sounds are normal,  absence of murmurs, rubs  or gallops. Peripheral vasculature: Radial pulses: normal Dorsal pedis pulses: normal Posterior pulses: normal  ABDOMEN:  Appearance: Obese Benign, no organomegaly, no masses, no Abdominal Aortic enlargement. No Guarding , no rebound. No Bruits. Bowel sounds: normal  RECTAL: N/A GU: N/A  EXTREMETIES: nonedematous.  MUSCULOSKELETAL:  Spine: normal Joints: intact  NEUROLOGIC: oriented to time,place and person; nonfocal. Strength is normal Sensory is normal Reflexes are normal Cranial Nerves are normal.   ASSESSMENT: Personal history of malignant neoplasm of unspecified site in gastrointestinal tract  Hyperlipidemia - Plan: NMR, lipoprofile  HTN (hypertension) - Plan: CMP14+EGFR  Diabetes 1.5, managed as type 2 - Plan: POCT glycosylated hemoglobin (Hb A1C), CMP14+EGFR  A-fib - Plan: POCT INR  Gout - Plan: Uric acid  Allergic conjunctivitis - Plan: azelastine (OPTIVAR) 0.05 % ophthalmic solution  Conjunctivitis unspecified - Plan: sulfacetamide (BLEPH-10) 10 % ophthalmic solution  PLAN:      Dr Paula Libra Recommendations  For nutrition information, I recommend books:  1).Eat to Live by Dr Excell Seltzer. 2).Prevent and Reverse Heart Disease by Dr Karl Luke. 3) Dr Janene Harvey Book:  Program to Reverse Diabetes  Exercise recommendations are:  If unable to walk, then the patient can exercise in a chair 3 times a day. By flapping arms like a bird gently and raising legs outwards to the front.  If ambulatory, the patient can go for walks for 30 minutes 3 times a week. Then increase the intensity and duration as tolerated.  Goal is to try to attain exercise frequency to 5 times a week.  If applicable: Best to perform resistance exercises (machines or weights) 2 days a week and cardio type exercises 3 days per week.   counselled extensively on his obesity!!!  Orders Placed This Encounter  Procedures  . CMP14+EGFR  . NMR, lipoprofile  . Uric  acid  . POCT glycosylated hemoglobin (Hb A1C)  . POCT INR   Meds ordered this encounter  Medications  . azelastine (OPTIVAR) 0.05 % ophthalmic solution    Sig: Place 2 drops into both eyes 2 (two) times daily.    Dispense:  6 mL    Refill:  12  . sulfacetamide (BLEPH-10) 10 % ophthalmic solution    Sig: Place 1 drop into the left eye every 4 (four) hours.    Dispense:  15 mL    Refill:  1   Medications Discontinued During This Encounter  Medication Reason  . allopurinol (ZYLOPRIM) 161 MG tablet Duplicate   Return in about 3 months (around 07/19/2013) for Recheck medical problems.  Ruchel Brandenburger P. Jacelyn Grip, M.D.

## 2013-04-19 NOTE — Patient Instructions (Signed)
      Dr Mikkel Charrette's Recommendations  For nutrition information, I recommend books:  1).Eat to Live by Dr Joel Fuhrman. 2).Prevent and Reverse Heart Disease by Dr Caldwell Esselstyn. 3) Dr Neal Barnard's Book:  Program to Reverse Diabetes  Exercise recommendations are:  If unable to walk, then the patient can exercise in a chair 3 times a day. By flapping arms like a bird gently and raising legs outwards to the front.  If ambulatory, the patient can go for walks for 30 minutes 3 times a week. Then increase the intensity and duration as tolerated.  Goal is to try to attain exercise frequency to 5 times a week.  If applicable: Best to perform resistance exercises (machines or weights) 2 days a week and cardio type exercises 3 days per week.  

## 2013-04-20 LAB — CMP14+EGFR
ALT: 21 IU/L (ref 0–44)
AST: 25 IU/L (ref 0–40)
Albumin/Globulin Ratio: 1.7 (ref 1.1–2.5)
Albumin: 4 g/dL (ref 3.6–4.8)
Alkaline Phosphatase: 63 IU/L (ref 39–117)
BUN/Creatinine Ratio: 19 (ref 10–22)
BUN: 20 mg/dL (ref 8–27)
CO2: 23 mmol/L (ref 18–29)
Calcium: 9.2 mg/dL (ref 8.6–10.2)
Chloride: 103 mmol/L (ref 97–108)
Creatinine, Ser: 1.07 mg/dL (ref 0.76–1.27)
GFR calc Af Amer: 86 mL/min/{1.73_m2} (ref >59)
GFR calc non Af Amer: 75 mL/min/{1.73_m2} (ref >59)
Globulin, Total: 2.3 g/dL (ref 1.5–4.5)
Glucose: 127 mg/dL — ABNORMAL HIGH (ref 65–99)
Potassium: 4.4 mmol/L (ref 3.5–5.2)
Sodium: 144 mmol/L (ref 134–144)
Total Bilirubin: 0.4 mg/dL (ref 0.0–1.2)
Total Protein: 6.3 g/dL (ref 6.0–8.5)

## 2013-04-20 LAB — NMR, LIPOPROFILE
Cholesterol: 109 mg/dL (ref <200)
HDL Cholesterol by NMR: 24 mg/dL — ABNORMAL LOW (ref >=40)
HDL Particle Number: 22.9 umol/L — ABNORMAL LOW (ref >=30.5)
LDL Particle Number: 727 nmol/L (ref <1000)
LDL Size: 19.7 nm (ref >20.5)
LDLC SERPL CALC-MCNC: 46 mg/dL (ref <100)
LP-IR Score: 85 — ABNORMAL HIGH (ref <=45)
Small LDL Particle Number: 558 nmol/L — ABNORMAL HIGH (ref <=527)
Triglycerides by NMR: 194 mg/dL — ABNORMAL HIGH (ref <150)

## 2013-04-20 LAB — URIC ACID: Uric Acid: 5.6 mg/dL (ref 3.7–8.6)

## 2013-04-30 ENCOUNTER — Other Ambulatory Visit: Payer: BC Managed Care – PPO

## 2013-04-30 LAB — POCT INR: INR: 1.8

## 2013-05-04 ENCOUNTER — Other Ambulatory Visit: Payer: Self-pay | Admitting: Family Medicine

## 2013-05-09 ENCOUNTER — Other Ambulatory Visit: Payer: BC Managed Care – PPO

## 2013-05-09 ENCOUNTER — Ambulatory Visit (INDEPENDENT_AMBULATORY_CARE_PROVIDER_SITE_OTHER): Payer: BC Managed Care – PPO | Admitting: Pharmacist

## 2013-05-09 DIAGNOSIS — I4891 Unspecified atrial fibrillation: Secondary | ICD-10-CM

## 2013-05-09 LAB — POCT INR: INR: 2.1

## 2013-05-09 NOTE — Patient Instructions (Signed)
Anticoagulation Dose Instructions as of 05/09/2013     Sun Mon Tue Wed Thu Fri Sat   New Dose 7.5 mg 10 mg 7.5 mg 10 mg 7.5 mg 10 mg 7.5 mg    Description       Continue 43m Mondays, Wednesdays and Fridays and 7.593mall other days       INR was 2.1 today

## 2013-05-09 NOTE — Progress Notes (Signed)
Pt came in for labs only 

## 2013-05-16 ENCOUNTER — Other Ambulatory Visit: Payer: Self-pay

## 2013-05-16 MED ORDER — WARFARIN SODIUM 5 MG PO TABS: ORAL_TABLET | ORAL | Status: DC

## 2013-05-16 NOTE — Telephone Encounter (Signed)
x

## 2013-06-02 ENCOUNTER — Other Ambulatory Visit: Payer: Self-pay | Admitting: Family Medicine

## 2013-06-03 ENCOUNTER — Ambulatory Visit (INDEPENDENT_AMBULATORY_CARE_PROVIDER_SITE_OTHER): Payer: BC Managed Care – PPO | Admitting: Pharmacist

## 2013-06-03 ENCOUNTER — Other Ambulatory Visit: Payer: Self-pay

## 2013-06-03 DIAGNOSIS — I4891 Unspecified atrial fibrillation: Secondary | ICD-10-CM

## 2013-06-03 LAB — POCT INR: INR: 2.8

## 2013-06-03 MED ORDER — LISINOPRIL 20 MG PO TABS: ORAL_TABLET | ORAL | Status: DC

## 2013-06-03 MED ORDER — ATORVASTATIN CALCIUM 20 MG PO TABS: ORAL_TABLET | ORAL | Status: DC

## 2013-06-03 NOTE — Patient Instructions (Signed)
Anticoagulation Dose Instructions as of 06/03/2013     Sun Mon Tue Wed Thu Fri Sat   New Dose 7.5 mg 10 mg 7.5 mg 10 mg 7.5 mg 10 mg 7.5 mg    Description       Continue 45m Mondays, Wednesdays and Fridays and 7.566mall other days       INR was 2.8 today

## 2013-06-06 ENCOUNTER — Other Ambulatory Visit: Payer: Self-pay | Admitting: Family Medicine

## 2013-06-08 ENCOUNTER — Other Ambulatory Visit: Payer: Self-pay | Admitting: Family Medicine

## 2013-06-17 ENCOUNTER — Other Ambulatory Visit: Payer: Self-pay | Admitting: *Deleted

## 2013-06-17 MED ORDER — DILTIAZEM HCL ER COATED BEADS 240 MG PO CP24: ORAL_CAPSULE | ORAL | Status: DC

## 2013-06-28 ENCOUNTER — Telehealth: Payer: Self-pay | Admitting: *Deleted

## 2013-06-28 ENCOUNTER — Encounter: Payer: Self-pay | Admitting: Gastroenterology

## 2013-06-28 NOTE — Telephone Encounter (Signed)
Left msg on triage stating md need to resubmit a claim from the colonoscpy that was done. Called pt back no answer LMOM need to contact the billing dept [phone]...William Flores

## 2013-06-28 NOTE — Telephone Encounter (Signed)
Error

## 2013-07-18 ENCOUNTER — Ambulatory Visit (INDEPENDENT_AMBULATORY_CARE_PROVIDER_SITE_OTHER): Payer: BC Managed Care – PPO | Admitting: Pharmacist

## 2013-07-18 ENCOUNTER — Encounter: Payer: Self-pay | Admitting: Pharmacist

## 2013-07-18 VITALS — BP 128/70 | HR 68 | Ht 63.0 in | Wt 295.0 lb

## 2013-07-18 DIAGNOSIS — I4891 Unspecified atrial fibrillation: Secondary | ICD-10-CM

## 2013-07-18 DIAGNOSIS — M1A4710 Other secondary chronic gout, right ankle and foot, without tophus (tophi): Secondary | ICD-10-CM

## 2013-07-18 DIAGNOSIS — E119 Type 2 diabetes mellitus without complications: Secondary | ICD-10-CM

## 2013-07-18 DIAGNOSIS — M1A00X0 Idiopathic chronic gout, unspecified site, without tophus (tophi): Secondary | ICD-10-CM

## 2013-07-18 DIAGNOSIS — E785 Hyperlipidemia, unspecified: Secondary | ICD-10-CM

## 2013-07-18 LAB — POCT GLYCOSYLATED HEMOGLOBIN (HGB A1C)

## 2013-07-18 LAB — POCT INR: INR: 1.7

## 2013-07-18 NOTE — Patient Instructions (Signed)
Anticoagulation Dose Instructions as of 07/18/2013     Sun Mon Tue Wed Thu Fri Sat   New Dose 7.5 mg 10 mg 7.5 mg 10 mg 7.5 mg 10 mg 7.5 mg    Description       Take 2 tablet today - thursdays, July 16th then continue 51m Mondays, Wednesdays and Fridays and 7.561mall other days      INR was 1.7 today

## 2013-07-18 NOTE — Progress Notes (Signed)
Diabetes Follow-Up Visit Chief Complaint:   Chief Complaint  Patient presents with  . Diabetes  . Atrial Fibrillation     Filed Vitals:   07/18/13 1644  BP: 128/70  Pulse: 68   HPI: patient with tyep 2 DM due labs.  He is currently taking Janumet 50/1000mg BID without any side effects Hyperlidemia also on pravastatin - due labs today.  Gout - controlled currently on allopurinol 424m daily  Low fat/carbohydrate diet?  No - but is trying to limit CHO intake and is doing better over last 6 months Nicotine Abuse?  No Medication Compliance?  Yes Exercise?  No Alcohol Abuse?  No    Lab Results  Component Value Date   HGBA1C 7.5 % 07/18/2013    No results found for this basename:Derl Barrow   Lab Results  Component Value Date   CHOL 109 04/19/2013   HDL 24* 04/19/2013   LDLCALC 46 04/19/2013   TRIG 194* 04/19/2013   CHOLHDL 4.8 05/25/2012      Assessment: 1.  Diabetes.  uncontrolled 2.  Blood Pressure.  Good - at goal today 3.  Lipids.  Pending results 4. subtherapeutic anticoagulation   Recommendations: 1.  Medication recommendations at this time are as follows:  No changes currently - focus on TLC 2.  Reviewed HBG goals:  Fasting 80-130 and 1-2 hour post prandial <180.  Patient is instructed to check BG 1 times per day.    3.  BP goal < 140/85. 4.  LDL goal of < 100, HDL > 40 and TG < 150. 5.  Eye Exam yearly and Dental Exam every 6 months. 6.  Dietary recommendations:  Limit high CHO and fat containing foods.  incrase non starchy vegetables 7.  Physical Activity recommendations:  Increase as able 8.  Return to clinic in 4-6 wks  TCherre Robins PharmD, CPP

## 2013-07-19 ENCOUNTER — Telehealth: Payer: Self-pay | Admitting: Pharmacist

## 2013-07-19 LAB — NMR, LIPOPROFILE
Cholesterol: 126 mg/dL (ref 100–199)
HDL Cholesterol by NMR: 24 mg/dL — ABNORMAL LOW (ref >39)
HDL Particle Number: 22.3 umol/L — ABNORMAL LOW (ref >=30.5)
LDL Particle Number: 856 nmol/L (ref <1000)
LDL Size: 19.8 nm (ref >20.5)
LDLC SERPL CALC-MCNC: 67 mg/dL (ref 0–99)
LP-IR Score: 65 — ABNORMAL HIGH (ref <=45)
SMALL LDL PARTICLE NUMBER: 649 nmol/L — AB (ref <=527)
TRIGLYCERIDES BY NMR: 177 mg/dL — AB (ref 0–149)

## 2013-07-19 LAB — CMP14+EGFR
A/G RATIO: 1.6 (ref 1.1–2.5)
ALT: 24 IU/L (ref 0–44)
AST: 24 IU/L (ref 0–40)
Albumin: 3.9 g/dL (ref 3.6–4.8)
Alkaline Phosphatase: 63 IU/L (ref 39–117)
BUN/Creatinine Ratio: 19 (ref 10–22)
BUN: 19 mg/dL (ref 8–27)
CO2: 25 mmol/L (ref 18–29)
CREATININE: 1 mg/dL (ref 0.76–1.27)
Calcium: 9.4 mg/dL (ref 8.6–10.2)
Chloride: 100 mmol/L (ref 97–108)
GFR, EST AFRICAN AMERICAN: 93 mL/min/{1.73_m2} (ref >59)
GFR, EST NON AFRICAN AMERICAN: 81 mL/min/{1.73_m2} (ref >59)
GLOBULIN, TOTAL: 2.5 g/dL (ref 1.5–4.5)
GLUCOSE: 112 mg/dL — AB (ref 65–99)
POTASSIUM: 4.5 mmol/L (ref 3.5–5.2)
Sodium: 140 mmol/L (ref 134–144)
TOTAL PROTEIN: 6.4 g/dL (ref 6.0–8.5)
Total Bilirubin: 0.4 mg/dL (ref 0.0–1.2)

## 2013-07-19 LAB — URIC ACID: Uric Acid: 5.9 mg/dL (ref 3.7–8.6)

## 2013-07-19 LAB — MICROALBUMIN / CREATININE URINE RATIO
CREATININE UR: 105.9 mg/dL (ref 22.0–328.0)
MICROALB/CREAT RATIO: 2128.9 mg/g creat — ABNORMAL HIGH (ref 0.0–30.0)
Microalbumin, Urine: 2254.5 ug/mL — ABNORMAL HIGH (ref 0.0–17.0)

## 2013-07-20 ENCOUNTER — Other Ambulatory Visit: Payer: Self-pay | Admitting: Family Medicine

## 2013-07-22 NOTE — Telephone Encounter (Signed)
Patient notified of lab results - discussed diet  / limiting CHO's Will try TLC changes - if not at goal next check then considering adding additional agent for DM / Tg

## 2013-08-05 ENCOUNTER — Encounter (HOSPITAL_COMMUNITY): Payer: Self-pay | Admitting: Emergency Medicine

## 2013-08-05 ENCOUNTER — Ambulatory Visit (INDEPENDENT_AMBULATORY_CARE_PROVIDER_SITE_OTHER): Payer: BC Managed Care – PPO

## 2013-08-05 ENCOUNTER — Inpatient Hospital Stay (HOSPITAL_COMMUNITY)
Admission: EM | Admit: 2013-08-05 | Discharge: 2013-08-10 | DRG: 291 | Disposition: A | Discharge status: Home / Self Care | Payer: BC Managed Care – PPO | Attending: Internal Medicine | Admitting: Internal Medicine

## 2013-08-05 ENCOUNTER — Emergency Department (HOSPITAL_COMMUNITY): Payer: BC Managed Care – PPO

## 2013-08-05 ENCOUNTER — Ambulatory Visit (INDEPENDENT_AMBULATORY_CARE_PROVIDER_SITE_OTHER): Payer: BC Managed Care – PPO | Admitting: Family Medicine

## 2013-08-05 VITALS — BP 138/79 | HR 96 | Temp 99.3°F | Ht 63.0 in | Wt 300.6 lb

## 2013-08-05 DIAGNOSIS — G4733 Obstructive sleep apnea (adult) (pediatric): Secondary | ICD-10-CM

## 2013-08-05 DIAGNOSIS — Z7982 Long term (current) use of aspirin: Secondary | ICD-10-CM

## 2013-08-05 DIAGNOSIS — I509 Heart failure, unspecified: Secondary | ICD-10-CM

## 2013-08-05 DIAGNOSIS — I5032 Chronic diastolic (congestive) heart failure: Secondary | ICD-10-CM | POA: Diagnosis present

## 2013-08-05 DIAGNOSIS — E119 Type 2 diabetes mellitus without complications: Secondary | ICD-10-CM | POA: Diagnosis present

## 2013-08-05 DIAGNOSIS — I5043 Acute on chronic combined systolic (congestive) and diastolic (congestive) heart failure: Principal | ICD-10-CM | POA: Diagnosis present

## 2013-08-05 DIAGNOSIS — Z9989 Dependence on other enabling machines and devices: Secondary | ICD-10-CM

## 2013-08-05 DIAGNOSIS — R5383 Other fatigue: Secondary | ICD-10-CM

## 2013-08-05 DIAGNOSIS — N481 Balanitis: Secondary | ICD-10-CM

## 2013-08-05 DIAGNOSIS — N476 Balanoposthitis: Secondary | ICD-10-CM

## 2013-08-05 DIAGNOSIS — Z85038 Personal history of other malignant neoplasm of large intestine: Secondary | ICD-10-CM

## 2013-08-05 DIAGNOSIS — I2510 Atherosclerotic heart disease of native coronary artery without angina pectoris: Secondary | ICD-10-CM | POA: Diagnosis present

## 2013-08-05 DIAGNOSIS — I4891 Unspecified atrial fibrillation: Secondary | ICD-10-CM

## 2013-08-05 DIAGNOSIS — K2100 Gastro-esophageal reflux disease with esophagitis, without bleeding: Secondary | ICD-10-CM

## 2013-08-05 DIAGNOSIS — Z7901 Long term (current) use of anticoagulants: Secondary | ICD-10-CM

## 2013-08-05 DIAGNOSIS — D649 Anemia, unspecified: Secondary | ICD-10-CM | POA: Diagnosis present

## 2013-08-05 DIAGNOSIS — E785 Hyperlipidemia, unspecified: Secondary | ICD-10-CM | POA: Diagnosis present

## 2013-08-05 DIAGNOSIS — I5021 Acute systolic (congestive) heart failure: Secondary | ICD-10-CM

## 2013-08-05 DIAGNOSIS — R531 Weakness: Secondary | ICD-10-CM

## 2013-08-05 DIAGNOSIS — E139 Other specified diabetes mellitus without complications: Secondary | ICD-10-CM

## 2013-08-05 DIAGNOSIS — I119 Hypertensive heart disease without heart failure: Secondary | ICD-10-CM | POA: Diagnosis present

## 2013-08-05 DIAGNOSIS — Z885 Allergy status to narcotic agent status: Secondary | ICD-10-CM

## 2013-08-05 DIAGNOSIS — R0602 Shortness of breath: Secondary | ICD-10-CM

## 2013-08-05 DIAGNOSIS — I10 Essential (primary) hypertension: Secondary | ICD-10-CM

## 2013-08-05 DIAGNOSIS — R5381 Other malaise: Secondary | ICD-10-CM

## 2013-08-05 DIAGNOSIS — Z6841 Body Mass Index (BMI) 40.0 and over, adult: Secondary | ICD-10-CM

## 2013-08-05 DIAGNOSIS — Z79899 Other long term (current) drug therapy: Secondary | ICD-10-CM

## 2013-08-05 DIAGNOSIS — I482 Chronic atrial fibrillation, unspecified: Secondary | ICD-10-CM

## 2013-08-05 DIAGNOSIS — I110 Hypertensive heart disease with heart failure: Secondary | ICD-10-CM | POA: Diagnosis present

## 2013-08-05 DIAGNOSIS — M109 Gout, unspecified: Secondary | ICD-10-CM | POA: Diagnosis present

## 2013-08-05 DIAGNOSIS — J9600 Acute respiratory failure, unspecified whether with hypoxia or hypercapnia: Secondary | ICD-10-CM | POA: Diagnosis present

## 2013-08-05 DIAGNOSIS — I428 Other cardiomyopathies: Secondary | ICD-10-CM | POA: Diagnosis present

## 2013-08-05 LAB — BASIC METABOLIC PANEL
Anion gap: 11 (ref 5–15)
BUN: 19 mg/dL (ref 6–23)
CO2: 26 mEq/L (ref 19–32)
CREATININE: 0.98 mg/dL (ref 0.50–1.35)
Calcium: 9.3 mg/dL (ref 8.4–10.5)
Chloride: 104 mEq/L (ref 96–112)
GFR, EST NON AFRICAN AMERICAN: 87 mL/min — AB (ref >90)
GLUCOSE: 127 mg/dL — AB (ref 70–99)
POTASSIUM: 4.6 meq/L (ref 3.7–5.3)
Sodium: 141 mEq/L (ref 137–147)

## 2013-08-05 LAB — PRO B NATRIURETIC PEPTIDE: Pro B Natriuretic peptide (BNP): 376.6 pg/mL — ABNORMAL HIGH (ref 0–125)

## 2013-08-05 LAB — CBC
HEMATOCRIT: 36.1 % — AB (ref 39.0–52.0)
Hemoglobin: 10.9 g/dL — ABNORMAL LOW (ref 13.0–17.0)
MCH: 27.3 pg (ref 26.0–34.0)
MCHC: 30.2 g/dL (ref 30.0–36.0)
MCV: 90.3 fL (ref 78.0–100.0)
Platelets: 217 10*3/uL (ref 150–400)
RBC: 4 MIL/uL — ABNORMAL LOW (ref 4.22–5.81)
RDW: 17.4 % — AB (ref 11.5–15.5)
WBC: 8.2 10*3/uL (ref 4.0–10.5)

## 2013-08-05 LAB — I-STAT TROPONIN, ED: Troponin i, poc: 0 ng/mL (ref 0.00–0.08)

## 2013-08-05 LAB — PROTIME-INR
INR: 2.31 — AB (ref 0.00–1.49)
Prothrombin Time: 25.4 seconds — ABNORMAL HIGH (ref 11.6–15.2)

## 2013-08-05 LAB — GLUCOSE, CAPILLARY: Glucose-Capillary: 217 mg/dL — ABNORMAL HIGH (ref 70–99)

## 2013-08-05 MED ORDER — INSULIN ASPART 100 UNIT/ML ~~LOC~~ SOLN
0.0000 [IU] | Freq: Three times a day (TID) | SUBCUTANEOUS | Status: DC
  Administered 2013-08-06: 3 [IU] via SUBCUTANEOUS
  Administered 2013-08-06: 2 [IU] via SUBCUTANEOUS
  Administered 2013-08-06: 3 [IU] via SUBCUTANEOUS
  Administered 2013-08-07: 8 [IU] via SUBCUTANEOUS
  Administered 2013-08-07: 2 [IU] via SUBCUTANEOUS
  Administered 2013-08-07 – 2013-08-08 (×4): 3 [IU] via SUBCUTANEOUS
  Administered 2013-08-09: 5 [IU] via SUBCUTANEOUS
  Administered 2013-08-09 (×2): 2 [IU] via SUBCUTANEOUS
  Administered 2013-08-10: 3 [IU] via SUBCUTANEOUS

## 2013-08-05 MED ORDER — IRBESARTAN 300 MG PO TABS
300.0000 mg | ORAL_TABLET | Freq: Every day | ORAL | Status: DC
  Administered 2013-08-06 – 2013-08-07 (×2): 300 mg via ORAL
  Filled 2013-08-05 (×2): qty 1

## 2013-08-05 MED ORDER — LISINOPRIL 20 MG PO TABS
20.0000 mg | ORAL_TABLET | Freq: Every day | ORAL | Status: DC
  Administered 2013-08-06 – 2013-08-07 (×2): 20 mg via ORAL
  Filled 2013-08-05 (×2): qty 1

## 2013-08-05 MED ORDER — WARFARIN SODIUM 10 MG PO TABS
10.0000 mg | ORAL_TABLET | Freq: Once | ORAL | Status: AC
  Administered 2013-08-05: 10 mg via ORAL
  Filled 2013-08-05: qty 1

## 2013-08-05 MED ORDER — ALLOPURINOL 100 MG PO TABS
100.0000 mg | ORAL_TABLET | Freq: Every day | ORAL | Status: DC
  Administered 2013-08-05 – 2013-08-10 (×6): 100 mg via ORAL
  Filled 2013-08-05 (×6): qty 1

## 2013-08-05 MED ORDER — ALBUTEROL (5 MG/ML) CONTINUOUS INHALATION SOLN
10.0000 mg/h | INHALATION_SOLUTION | RESPIRATORY_TRACT | Status: AC
  Administered 2013-08-05: 10 mg/h via RESPIRATORY_TRACT
  Filled 2013-08-05: qty 20

## 2013-08-05 MED ORDER — INSULIN ASPART 100 UNIT/ML ~~LOC~~ SOLN
0.0000 [IU] | Freq: Every day | SUBCUTANEOUS | Status: DC
  Administered 2013-08-05 – 2013-08-07 (×2): 2 [IU] via SUBCUTANEOUS

## 2013-08-05 MED ORDER — VALSARTAN-HYDROCHLOROTHIAZIDE 320-25 MG PO TABS: 1.0000 | ORAL_TABLET | Freq: Every day | ORAL | Status: DC

## 2013-08-05 MED ORDER — SULFACETAMIDE SODIUM 10 % OP SOLN
1.0000 [drp] | OPHTHALMIC | Status: DC
  Filled 2013-08-05: qty 15

## 2013-08-05 MED ORDER — KETOTIFEN FUMARATE 0.025 % OP SOLN
2.0000 [drp] | Freq: Two times a day (BID) | OPHTHALMIC | Status: DC
  Filled 2013-08-05: qty 5

## 2013-08-05 MED ORDER — LISINOPRIL 20 MG PO TABS
20.0000 mg | ORAL_TABLET | Freq: Every day | ORAL | Status: DC
  Filled 2013-08-05: qty 1

## 2013-08-05 MED ORDER — PANTOPRAZOLE SODIUM 40 MG PO TBEC
40.0000 mg | DELAYED_RELEASE_TABLET | Freq: Every day | ORAL | Status: DC
  Administered 2013-08-05 – 2013-08-10 (×6): 40 mg via ORAL
  Filled 2013-08-05 (×4): qty 1

## 2013-08-05 MED ORDER — DILTIAZEM HCL ER COATED BEADS 240 MG PO CP24
240.0000 mg | ORAL_CAPSULE | Freq: Every day | ORAL | Status: DC
  Administered 2013-08-05 – 2013-08-10 (×6): 240 mg via ORAL
  Filled 2013-08-05 (×8): qty 1

## 2013-08-05 MED ORDER — ACETAMINOPHEN 650 MG RE SUPP: 650.0000 mg | Freq: Four times a day (QID) | RECTAL | Status: DC | PRN

## 2013-08-05 MED ORDER — WARFARIN - PHARMACIST DOSING INPATIENT
Freq: Every day | Status: DC
  Administered 2013-08-08 – 2013-08-09 (×2)

## 2013-08-05 MED ORDER — FUROSEMIDE 10 MG/ML IJ SOLN
40.0000 mg | Freq: Once | INTRAMUSCULAR | Status: AC
  Administered 2013-08-05: 40 mg via INTRAVENOUS
  Filled 2013-08-05: qty 4

## 2013-08-05 MED ORDER — SITAGLIPTIN PHOS-METFORMIN HCL 50-1000 MG PO TABS: 1.0000 | ORAL_TABLET | Freq: Two times a day (BID) | ORAL | Status: DC

## 2013-08-05 MED ORDER — ONDANSETRON HCL 4 MG/2ML IJ SOLN: 4.0000 mg | Freq: Three times a day (TID) | INTRAMUSCULAR | Status: AC | PRN

## 2013-08-05 MED ORDER — PNEUMOCOCCAL VAC POLYVALENT 25 MCG/0.5ML IJ INJ
0.5000 mL | INJECTION | INTRAMUSCULAR | Status: AC
  Administered 2013-08-06: 0.5 mL via INTRAMUSCULAR
  Filled 2013-08-05: qty 0.5

## 2013-08-05 MED ORDER — OMEPRAZOLE 20 MG PO CPDR: 20.0000 mg | DELAYED_RELEASE_CAPSULE | Freq: Every day | ORAL | Status: DC

## 2013-08-05 MED ORDER — ACETAMINOPHEN 325 MG PO TABS: 650.0000 mg | ORAL_TABLET | Freq: Four times a day (QID) | ORAL | Status: DC | PRN

## 2013-08-05 MED ORDER — NYSTATIN 100000 UNIT/GM EX CREA: 1.0000 "application " | TOPICAL_CREAM | Freq: Two times a day (BID) | CUTANEOUS | Status: DC

## 2013-08-05 MED ORDER — ALLOPURINOL 300 MG PO TABS
300.0000 mg | ORAL_TABLET | Freq: Every day | ORAL | Status: DC
  Administered 2013-08-05 – 2013-08-07 (×3): 300 mg via ORAL
  Administered 2013-08-08: 400 mg via ORAL
  Administered 2013-08-09 – 2013-08-10 (×2): 300 mg via ORAL
  Filled 2013-08-05 (×6): qty 1

## 2013-08-05 MED ORDER — SODIUM CHLORIDE 0.9 % IJ SOLN
3.0000 mL | Freq: Two times a day (BID) | INTRAMUSCULAR | Status: DC
  Administered 2013-08-05 – 2013-08-09 (×9): 3 mL via INTRAVENOUS

## 2013-08-05 MED ORDER — ASPIRIN EC 81 MG PO TBEC
81.0000 mg | DELAYED_RELEASE_TABLET | Freq: Every day | ORAL | Status: DC
  Administered 2013-08-06 – 2013-08-10 (×5): 81 mg via ORAL
  Filled 2013-08-05 (×5): qty 1

## 2013-08-05 MED ORDER — HYDROCHLOROTHIAZIDE 25 MG PO TABS
25.0000 mg | ORAL_TABLET | Freq: Every day | ORAL | Status: DC
  Administered 2013-08-06: 25 mg via ORAL
  Filled 2013-08-05: qty 1

## 2013-08-05 MED ORDER — ATORVASTATIN CALCIUM 20 MG PO TABS
20.0000 mg | ORAL_TABLET | Freq: Every day | ORAL | Status: DC
  Administered 2013-08-05 – 2013-08-10 (×6): 20 mg via ORAL
  Filled 2013-08-05 (×6): qty 1

## 2013-08-05 MED ORDER — ALBUTEROL SULFATE (2.5 MG/3ML) 0.083% IN NEBU: 2.5000 mg | INHALATION_SOLUTION | Freq: Four times a day (QID) | RESPIRATORY_TRACT | Status: DC | PRN

## 2013-08-05 NOTE — Progress Notes (Addendum)
  CARE MANAGEMENT ED NOTE 08/05/2013  Patient:  William Flores, William Flores   Account Number:  [id number]  Date Initiated:  08/05/2013  Documentation initiated by:  Jackelyn Poling  Subjective/Objective Assessment:   63 yr old [name] was sent here from PCP with c/o increased SOB to rule out pulmonary edema. Pt sts gradual increase in shortness of breath, esp.with exertion, sts also lower extremity edema. O2 on RA 89%     Subjective/Objective Assessment Detail:   Pt received cpap > 5 yrs ago Pt states he contacted the company he originally obtained his cpap from and consulted a local home health/DME company near him but unable to obtain one He was referred to his pcp to have a new sleep study and to obtain a new cpap  Pt agreed to have another sleep study completed  Ex wife works for [employer] per pt  Pt reports he needs a new pcp and CV provider Has not seen either in "years"     Action/Plan:   [facility] ED CM noted Cm consult for assist with new cpap mask. Discussed need to return to company he received his cpap from Agreed with his referral to have sleep study completed again to possibly obtain a new machine Assisted pt to & from [facility]   Action/Plan Detail:   Provided pt with a list of pcps & Sleep disorder center in network for [place] PPO Pt refused list of CV [place] assigned pcp from [facility] stay   Anticipated DC Date:  08/08/2013     Status Recommendation to Physician:   Result of Recommendation:    Other ED [place]  Other  CM consult  Outpatient Services - Pt will follow up    Choice offered to / List presented to:            Status of service:  Completed, signed off  ED Comments:   ED Comments Detail:   Pt also given a list of [place] health agencies to assist with possibly obtaining a cpap mask

## 2013-08-05 NOTE — Progress Notes (Signed)
ANTICOAGULATION CONSULT NOTE - Initial Consult  Pharmacy Consult for coumadin Indication: atrial fibrillation  Allergies  Allergen Reactions  . Codeine     Headache     Patient Measurements: Height: 5' 4"  (162.6 cm) Weight: 296 lb 6.4 oz (134.446 kg) IBW/kg (Calculated) : 59.2   Vital Signs: Temp: 98.2 F (36.8 C) (08/03 1840) Temp src: Oral (08/03 1840) BP: 121/95 mmHg (08/03 1840) Pulse Rate: 104 (08/03 1840)  Labs:  Recent Labs  08/05/13 1403  HGB 10.9*  HCT 36.1*  PLT 217  LABPROT 25.4*  INR 2.31*  CREATININE 0.98    Estimated Creatinine Clearance: 100 ml/min (by C-G formula based on Cr of 0.98).   Medical History: Past Medical History  Diagnosis Date  . Diabetes   . Colon cancer   . Hypertension   . Sleep apnea   . Atrial fibrillation   . Hyperlipidemia     Medications:  Prescriptions prior to admission  Medication Sig Dispense Refill  . allopurinol (ZYLOPRIM) 100 MG tablet Take 100 mg by mouth daily.      Marland Kitchen allopurinol (ZYLOPRIM) 300 MG tablet Take 300 mg by mouth daily. Takes along with 100 mg tablet to equal 400 mg      . aspirin 81 MG tablet Take 81 mg by mouth daily.      Marland Kitchen atorvastatin (LIPITOR) 20 MG tablet Take 20 mg by mouth daily.      Marland Kitchen azelastine (OPTIVAR) 0.05 % ophthalmic solution Place 2 drops into both eyes 2 (two) times daily.  6 mL  12  . diltiazem (CARDIZEM CD) 240 MG 24 hr capsule Take 240 mg by mouth daily.      Marland Kitchen glucose blood test strip Use to check BG daily  100 each  2  . lisinopril (PRINIVIL,ZESTRIL) 20 MG tablet Take 20 mg by mouth daily.      Marland Kitchen omeprazole (PRILOSEC) 20 MG capsule Take 1 capsule (20 mg total) by mouth daily.  30 capsule  3  . ONETOUCH DELICA LANCETS 10R MISC 1 each by Does not apply route daily. Use one lancet to check BG daily  100 each  2  . sitaGLIPtin-metformin (JANUMET) 50-1000 MG per tablet Take 1 tablet by mouth 2 (two) times daily with a meal.  60 tablet  3  . sulfacetamide (BLEPH-10) 10 %  ophthalmic solution Place 1 drop into the left eye every 4 (four) hours.  15 mL  1  . valsartan-hydrochlorothiazide (DIOVAN-HCT) 320-25 MG per tablet Take 1 tablet by mouth daily.      Marland Kitchen warfarin (COUMADIN) 5 MG tablet Take 5-10 mg by mouth daily. Takes 2 tablet- Monday, Wed, and Fri. The other days pt takes one and half.        Assessment: 63 yo man to continue coumadin for afib.  INR is therapeutic at 2.31.  Hg 10.9, PTLC 217 Goal of Therapy:  INR 2-3 Monitor platelets by anticoagulation protocol: Yes   Plan:  Coumadin 10 mg today as per home regimen. Daily PT/INR  Thanks for allowing pharmacy to be a part of this patient's care.  Excell Seltzer, PharmD Clinical Pharmacist, [phone] 08/05/2013,7:06 PM

## 2013-08-05 NOTE — ED Notes (Signed)
Admitting MD at bedside.

## 2013-08-05 NOTE — ED Provider Notes (Signed)
CSN: [id number]     Arrival date & time 08/05/13  1347 History   First MD Initiated Contact with Patient 08/05/13 1503     Chief Complaint  Patient presents with  . Shortness of Breath     (Consider location/radiation/quality/duration/timing/severity/associated sxs/prior Treatment) HPI Comments: Pt has 63 y/o male with hx of afib on coumadin (reports a normal cath 10-15 years ago when diagnosed with afib).  He states that one month ago he started to get SOB and has had DOE since that time - it is severe at times and now he states that he can only walk a shoft distance without getting severely SOB.  He has chronic mild swelling of his legs and he does have a cough - he has no fevers, no chills, no decrease in appetite.  He has no CP.  He was seen at [facility] prior to being directed to the ED for further evaluation and came by ambulance.  Sx are intermittent, worsened with exertion.  Sx are gradually worsening.  The pt also endorses having sleep apnea and has not used CPAP in months as he is out of his masks...  Patient is a 63 y.o. male presenting with shortness of breath. The history is provided by the patient.  Shortness of Breath   Past Medical History  Diagnosis Date  . Diabetes   . Colon cancer   . Hypertension   . Sleep apnea   . Atrial fibrillation   . Hyperlipidemia    Past Surgical History  Procedure Laterality Date  . Colon resection    . Circumcision    . Colonoscopy N/A 09/21/2012    Procedure: COLONOSCOPY;  Surgeon: Inda Castle, MD;  Location: [facility];  Service: Endoscopy;  Laterality: N/A;   Family History  Problem Relation Age of Onset  . Breast cancer Mother   . Diabetes Mother    History  Substance Use Topics  . Smoking status: Never Smoker   . Smokeless tobacco: Never Used  . Alcohol Use: No    Review of Systems  Respiratory: Positive for shortness of breath.   All other systems reviewed and are negative.     Allergies   Codeine  Home Medications   Prior to Admission medications   Medication Sig Start Date End Date Taking? Authorizing Provider  allopurinol (ZYLOPRIM) 100 MG tablet Take 100 mg by mouth daily.   Yes Historical Provider, MD  allopurinol (ZYLOPRIM) 300 MG tablet Take 300 mg by mouth daily. Takes along with 100 mg tablet to equal 400 mg   Yes Historical Provider, MD  aspirin 81 MG tablet Take 81 mg by mouth daily.   Yes Historical Provider, MD  atorvastatin (LIPITOR) 20 MG tablet Take 20 mg by mouth daily.   Yes Historical Provider, MD  azelastine (OPTIVAR) 0.05 % ophthalmic solution Place 2 drops into both eyes 2 (two) times daily. 04/19/13  Yes Vernie Shanks, MD  diltiazem (CARDIZEM CD) 240 MG 24 hr capsule Take 240 mg by mouth daily.   Yes Historical Provider, MD  glucose blood test strip Use to check BG daily 09/13/12  Yes Tammy Eckard, PHARMD  lisinopril (PRINIVIL,ZESTRIL) 20 MG tablet Take 20 mg by mouth daily.   Yes Historical Provider, MD  omeprazole (PRILOSEC) 20 MG capsule Take 1 capsule (20 mg total) by mouth daily. 08/05/13  Yes Lysbeth Penner, FNP  [facility] DELICA LANCETS 22W MISC 1 each by Does not apply route daily. Use one lancet to check  BG daily 09/13/12  Yes Tammy Eckard, PHARMD  sitaGLIPtin-metformin (JANUMET) 50-1000 MG per tablet Take 1 tablet by mouth 2 (two) times daily with a meal. 08/05/13  Yes Lysbeth Penner, FNP  sulfacetamide (BLEPH-10) 10 % ophthalmic solution Place 1 drop into the left eye every 4 (four) hours. 04/19/13  Yes Vernie Shanks, MD  valsartan-hydrochlorothiazide (DIOVAN-HCT) 320-25 MG per tablet Take 1 tablet by mouth daily.   Yes Historical Provider, MD  warfarin (COUMADIN) 5 MG tablet Take 5-10 mg by mouth daily. Takes 2 tablet- Monday, Wed, and Fri. The other days pt takes one and half.   Yes Historical Provider, MD   BP 147/85  Pulse 99  Temp(Src) 98.6 F (37 C) (Oral)  Resp 20  SpO2 93% Physical Exam  Nursing note and vitals  reviewed. Constitutional: He appears well-developed and well-nourished. No distress.  HENT:  Head: Normocephalic and atraumatic.  Mouth/Throat: Oropharynx is clear and moist. No oropharyngeal exudate.  Eyes: Conjunctivae and EOM are normal. Pupils are equal, round, and reactive to light. Right eye exhibits no discharge. Left eye exhibits no discharge. No scleral icterus.  Neck: Normal range of motion. Neck supple. No JVD present. No thyromegaly present.  Cardiovascular: Regular rhythm, normal heart sounds and intact distal pulses.  Exam reveals no gallop and no friction rub.   No murmur heard. tachycardic  Pulmonary/Chest:  Decreased BS bilaterally, slight shallow breathing, no rales / wheezing or ronchi - no accessory muscle use.  Abdominal: Soft. Bowel sounds are normal. He exhibits no distension and no mass. There is no tenderness.  Musculoskeletal: Normal range of motion. He exhibits edema ( bilateral mild symmetrical pitting edema). He exhibits no tenderness.  Lymphadenopathy:    He has no cervical adenopathy.  Neurological: He is alert. Coordination normal.  Skin: Skin is warm and dry. No rash noted. No erythema.  Psychiatric: He has a normal mood and affect. His behavior is normal.    ED Course  Procedures (including critical care time) Labs Review Labs Reviewed  CBC - Abnormal; Notable for the following:    RBC 4.00 (*)    Hemoglobin 10.9 (*)    HCT 36.1 (*)    RDW 17.4 (*)    All other components within normal limits  PRO B NATRIURETIC PEPTIDE - Abnormal; Notable for the following:    Pro B Natriuretic peptide (BNP) 376.6 (*)    All other components within normal limits  BASIC METABOLIC PANEL - Abnormal; Notable for the following:    Glucose, Bld 127 (*)    GFR calc non Af Amer 87 (*)    All other components within normal limits  PROTIME-INR - Abnormal; Notable for the following:    Prothrombin Time 25.4 (*)    INR 2.31 (*)    All other components within normal  limits  I-STAT TROPOININ, ED    Imaging Review Dg Chest 2 View  08/05/2013   CLINICAL DATA:  Shortness of breath  EXAM: CHEST  2 VIEW  COMPARISON:  Radiographs were obtained today at 12:33pm at [facility]. These are available for comparison on [facility] PACS.  FINDINGS: New trace pleural effusions, interstitial edema, and central vascular congestion since previously. Moderate cardiomegaly is again noted.  IMPRESSION: New interstitial pulmonary edema and trace effusions with cardiomegaly again noted. Findings have developed since earlier today's comparison exam (please see prior on [facility] PACS from [place]).   Electronically Signed   By: Roena Malady.D.  On: 08/05/2013 15:34   Dg Chest 2 View  08/05/2013   CLINICAL DATA:  Shortness of breath and cough  EXAM: CHEST  2 VIEW  COMPARISON:  To eighteenth to facet 8  FINDINGS: The heart size is at the upper limits of normal. Both lungs are clear. The visualized skeletal structures are unremarkable.  IMPRESSION: No active cardiopulmonary disease.   Electronically Signed   By: Conchita Paris M.D.   On: 08/05/2013 15:14     EKG Interpretation   Date/Time:  Monday August 05 2013 13:56:34 EDT Ventricular Rate:  90 PR Interval:    QRS Duration: 88 QT Interval:  362 QTC Calculation: 443 R Axis:   82 Text Interpretation:  Atrial fibrillation Anterior infarct, old since last  tracing no significant change Confirmed by Bibiana Gillean  MD, Stevey Stapleton ([id number]) on  08/05/2013 3:05:50 PM      MDM   Final diagnoses:  Acute systolic congestive heart failure    New onset DOE - eval for pna / edema / COPD (pt has not smoked in years) and is on coumadin complaintly - less likely to be PE given INR of > 2.  Albuterol and reeval.  I have seen the xray 0- since this AM there has been interval development of edema - he is hypoxic and requiring O2 by [place] but does not appear in distress.  Anticipate admission for diuresis and  eval of CHF with cardiomegaly.  Echo shows 30% EF from 2008, hx of CHF< likely recurrent, lasix ordered,   D/w hospitalist who will admit.  Meds given in ED:  Medications  albuterol (PROVENTIL,VENTOLIN) solution continuous neb (not administered)  furosemide (LASIX) injection 40 mg (not administered)      Johnna Acosta, MD 08/05/13 1555

## 2013-08-05 NOTE — H&P (Signed)
[facility] History and Physical  SPARROW SANZO VWU:[id number] DOB: 12/11/1950 DOA: 08/05/2013  Referring physician: Dr. Sabra Heck PCP: Carolynn Serve, NP  Chief Complaint:  Sent from PCP office for worsening shortness of breath for past 3 weeks  HPI:  63 year old obese male with a history of nonischemic cardiomyopathy as per cardiac cath in 2000 and, A. fib on Coumadin, hypertension, hyperlipidemia, coronary artery disease, obstructive sleep apnea (on CPAP with either lead at the in use) history of colon cancer with resection, type 2 diabetes mellitus who was seen at his PCP office at [facility] this morning for shortness of breath. Patient reports that for past 3 weeks he has been progressively been short of breath with exertion. Prior to this he had been fairly active and ambulatory to good  distance without much discomfort. He reports having occasional orthopnea and PND as well. He thinks he has gained about 30 pounds in 3 months. He denies any sick contacts, recent illness or recent travel. Denies any changes in his medications recently. He reports being compliant with his diet and medications. He is on CPAP for his OSA but has not been using it frequently as he does not have fitting mask. Patient denies headache, dizziness, fever, chills, nausea , vomiting, chest pain, palpitations,  abdominal pain, bowel or urinary symptoms. Denies change in appetite.  Course in the ED Chest x-ray done at the outpatient setting this morning was unremarkable. She was found to be hypoxic in high 80s in the ED and improved saturation on 2 L via nasal cannula. Vitals were otherwise unremarkable. Blood work showed WBC of 8.2, hemoglobin of 10.9, hematocrit 36.1, platelets of 217. Chemistry was unremarkable. ProBNP was in 300s. INR of 2.31. EKG showed A. fib with no ST-T changes. A repeat chest x-ray done in the ED showed new interstitial pulmonary edema and trace effusions with  cardiomegaly. Patient given albuterol neb and 40 mg IV Lasix. Hospitalists admission requested to telemetry.  Review of Systems:  Constitutional: Denies fever, chills, diaphoresis, appetite change . Fatigue+.  HEENT: Denies photophobia, eye pain, ear pain, congestion, sore throat, rhinorrhea,  trouble swallowing, neck pain,    Respiratory: SOB, DOE, cough, denies chest tightness,  and wheezing.   Cardiovascular: Denies chest pain, palpitations, chronic Leg swelling.  Gastrointestinal: Denies nausea, vomiting, abdominal pain, diarrhea, constipation, blood in stool and abdominal distention.  Genitourinary: Denies dysuria, urgency, frequency, hematuria, flank pain and difficulty urinating.  Endocrine: Denies: hot or cold intolerance, polyuria, polydipsia. Musculoskeletal: Denies myalgias, back pain, joint swelling, arthralgias and gait problem.  Skin: Denies pallor, rash and wound.  Neurological: Denies dizziness, seizures, syncope, weakness, light-headedness, numbness and headaches.     Past Medical History  Diagnosis Date  . Diabetes   . Colon cancer   . Hypertension   . Sleep apnea   . Atrial fibrillation   . Hyperlipidemia    Past Surgical History  Procedure Laterality Date  . Colon resection    . Circumcision    . Colonoscopy N/A 09/21/2012    Procedure: COLONOSCOPY;  Surgeon: Inda Castle, MD;  Location: [facility];  Service: Endoscopy;  Laterality: N/A;   Social History:  reports that he quit smoking about 30 years ago. He has never used smokeless tobacco. He reports that he does not drink alcohol or use illicit drugs.  Allergies  Allergen Reactions  . Codeine     Headache     Family History  Problem Relation Age of Onset  .  Breast cancer Mother   . Diabetes Mother     Prior to Admission medications   Medication Sig Start Date End Date Taking? Authorizing Provider  allopurinol (ZYLOPRIM) 100 MG tablet Take 100 mg by mouth daily.   Yes Historical Provider, MD   allopurinol (ZYLOPRIM) 300 MG tablet Take 300 mg by mouth daily. Takes along with 100 mg tablet to equal 400 mg   Yes Historical Provider, MD  aspirin 81 MG tablet Take 81 mg by mouth daily.   Yes Historical Provider, MD  atorvastatin (LIPITOR) 20 MG tablet Take 20 mg by mouth daily.   Yes Historical Provider, MD  azelastine (OPTIVAR) 0.05 % ophthalmic solution Place 2 drops into both eyes 2 (two) times daily. 04/19/13  Yes Vernie Shanks, MD  diltiazem (CARDIZEM CD) 240 MG 24 hr capsule Take 240 mg by mouth daily.   Yes Historical Provider, MD  glucose blood test strip Use to check BG daily 09/13/12  Yes Tammy Eckard, PHARMD  lisinopril (PRINIVIL,ZESTRIL) 20 MG tablet Take 20 mg by mouth daily.   Yes Historical Provider, MD  omeprazole (PRILOSEC) 20 MG capsule Take 1 capsule (20 mg total) by mouth daily. 08/05/13  Yes Lysbeth Penner, FNP  [facility] DELICA LANCETS 32T MISC 1 each by Does not apply route daily. Use one lancet to check BG daily 09/13/12  Yes Tammy Eckard, PHARMD  sitaGLIPtin-metformin (JANUMET) 50-1000 MG per tablet Take 1 tablet by mouth 2 (two) times daily with a meal. 08/05/13  Yes Lysbeth Penner, FNP  sulfacetamide (BLEPH-10) 10 % ophthalmic solution Place 1 drop into the left eye every 4 (four) hours. 04/19/13  Yes Vernie Shanks, MD  valsartan-hydrochlorothiazide (DIOVAN-HCT) 320-25 MG per tablet Take 1 tablet by mouth daily.   Yes Historical Provider, MD  warfarin (COUMADIN) 5 MG tablet Take 5-10 mg by mouth daily. Takes 2 tablet- Monday, Wed, and Fri. The other days pt takes one and half.   Yes Historical Provider, MD     Physical Exam:  Filed Vitals:   08/05/13 1430 08/05/13 1530 08/05/13 1603 08/05/13 1626  BP:    140/69  Pulse: 96 93  101  Temp:      TempSrc:      Resp: 25 17  22   SpO2: 95% 98% 93% 97%    Constitutional: Vital signs reviewed.  obese male lying in bed in no acute distress HEENT: no pallor, no icterus, moist oral mucosa, no cervical lymphadenopathy,  no JVD appreciated Cardiovascular: S1 and S2 irregularly irregular, no murmurs rub or gallop Chest: Fine bibasilar crackles, no rhonchi or wheeze Abdominal: Soft. Non-tender, non-distended, bowel sounds are normal,  Ext: warm, trace pitting edema bilaterally  Neurological: Alert and oriented  Labs on Admission:  Basic Metabolic Panel:  Recent Labs Lab 08/05/13 1403  NA 141  K 4.6  CL 104  CO2 26  GLUCOSE 127*  BUN 19  CREATININE 0.98  CALCIUM 9.3   Liver Function Tests: No results found for this basename: AST, ALT, ALKPHOS, BILITOT, PROT, ALBUMIN,  in the last 168 hours No results found for this basename: LIPASE, AMYLASE,  in the last 168 hours No results found for this basename: AMMONIA,  in the last 168 hours CBC:  Recent Labs Lab 08/05/13 1403  WBC 8.2  HGB 10.9*  HCT 36.1*  MCV 90.3  PLT 217   Cardiac Enzymes: No results found for this basename: CKTOTAL, CKMB, CKMBINDEX, TROPONINI,  in the last 168 hours BNP: No components found  with this basename: POCBNP,  CBG: No results found for this basename: GLUCAP,  in the last 168 hours  Radiological Exams on Admission: Dg Chest 2 View  08/05/2013   CLINICAL DATA:  Shortness of breath  EXAM: CHEST  2 VIEW  COMPARISON:  Radiographs were obtained today at 12:33pm at [facility]. These are available for comparison on [facility] PACS.  FINDINGS: New trace pleural effusions, interstitial edema, and central vascular congestion since previously. Moderate cardiomegaly is again noted.  IMPRESSION: New interstitial pulmonary edema and trace effusions with cardiomegaly again noted. Findings have developed since earlier today's comparison exam (please see prior on [facility] PACS from [place]).   Electronically Signed   By: Conchita Paris M.D.   On: 08/05/2013 15:34   Dg Chest 2 View  08/05/2013   CLINICAL DATA:  Shortness of breath and cough  EXAM: CHEST  2 VIEW  COMPARISON:  To eighteenth to  facet 8  FINDINGS: The heart size is at the upper limits of normal. Both lungs are clear. The visualized skeletal structures are unremarkable.  IMPRESSION: No active cardiopulmonary disease.   Electronically Signed   By: Conchita Paris M.D.   On: 08/05/2013 15:14    EKG: A. Fib@90 , no ST-T changes  Assessment/Plan Principal Problem:   CHF (congestive heart failure)  Active Problems:   A-fib   HTN (hypertension)   Type 2 diabetes mellitus   Hyperlipidemia   Morbid obesity   Anemia   OSA on CPAP   Hypoxic respiratory failure Likely in the setting of acute congestive heart failure. Symptoms possibly also triggered by his underlying obstructive sleep apnea as patient has not been using his CPAP for a while. Patient has history of nonischemic cardiomyopathy as per echo and  cardiac cath from 2008 which showed EF of 25%.  -Patient clinically does appear volume overloaded with symptoms of occasional orthopnea and PND and progressive dyspnea for past 3 weeks. He also reports gaining about 30 pounds in the past 2 months. On exam he has fine bibasilar crackles and trace pitting edema (although reports having chronic leg edema). On reviewing his echo from 2008 patient -Patient admitted on telemetry. -I will place him on IV Lasix 40 mg every 2 hours. Monitor daily weights and I/O -Check 2D echo. -Place on oxygen 2L via nasal cannula. When necessary albuterol nebs. Given absence of chest pain and progressive symptoms over the last few weeks with weight gain and being on therapeutic INR, PE is less likely. -Patient on ACE and ARB and baby aspirin which will be continued.   A. fib Rate controlled on Cardizem. Continue Coumadin. Dosing per pharmacy  Diabetes mellitus Hold Janumet. A1c of 7.5.  Place on sliding scale insulin.   Obstructive sleep apnea Order CPAP  Hypertension Continue lisinopril, Cardizem and Diovan-HCTZ  History of gout Continue allopurinol  Anemia Appears to be chronic.  Has history of colon cancer status post resection.  no CBC in the system  Diet: Diabetic  DVT prophylaxis: On Coumadin   Code Status: full code Family Communication: None at bedside Disposition Plan: Admit to telemetry . Home once stable  Aiman Noe, [place] [facility] Pager [phone]  Total time spent on admission :70 minutes  If 7PM-7AM, please contact night-coverage [email or web address] Password [facility] 08/05/2013, 4:49 PM

## 2013-08-05 NOTE — Progress Notes (Signed)
Pt refuses to wear CPAP. Pt does not wear at home and does not wish to wear here per RN.

## 2013-08-05 NOTE — ED Notes (Signed)
1st attempt to call report: RN is tied up with a patient, will try again.

## 2013-08-05 NOTE — Progress Notes (Signed)
   Subjective:    Patient ID: William Flores, male    DOB: [birth year], 63 y.o.   MRN: [id number]  HPI  C/o SOB, fatigue, weight gain, abdominal distention, cough, GERD sx's, and DOE. He has hx of atrial fibrillation and obesity.  He has OSA and has not been wearing his cpap. He needs a new mask and headset.  He gets some PND which is new.  He gets out breath when walking across room.  He appears SOB when talking.  Review of Systems C/o fatigue and sob No chest pain, HA, dizziness, vision change, N/V, diarrhea, constipation, dysuria, urinary urgency or frequency, myalgias, arthralgias or rash.     Objective:   Physical Exam  Vital signs noted  Obese chronically ill appearing male.  HEENT - Head atraumatic Normocephalic                Eyes - PERRLA, Conjuctiva - clear Sclera- Clear EOMI                Ears - EAC's Wnl TM's Wnl Gross Hearing WNL                Throat - oropharanx wnl Respiratory - Lungs distant with inspiratory crackles left posterior base.  Oxygen saturation 92 with Rest and 86% with walking on room air Cardiac - Irregular rate and rythm S1 and S2 without murmur GI - Pandiculous abdomen soft Nontender and bowel sounds active x 4 GU- Balanitis on penis and no inguinal hernia Extremities - No edema. Neuro - Grossly intact.  EKG - Atrial Fib w/o acute st-t changes Lysbeth Penner FNP  cxr- pulmonary edema and cardiomegaly Prelimnary reading by Iverson Alamin    Assessment & Plan:  Balanitis - Plan: DISCONTINUED: nystatin cream (MYCOSTATIN)  Gastroesophageal reflux disease with esophagitis - Plan: omeprazole (PRILOSEC) 20 MG capsule  Weakness - Plan: DG Chest 2 View, POCT CBC, POCT UA - Microscopic Only, POCT urinalysis dipstick, BMP8+EGFR  Obesity, morbid - Plan: POCT CBC, BMP8+EGFR  Diabetes 1.5, managed as type 2 - Plan: sitaGLIPtin-metformin (JANUMET) 50-1000 MG per tablet  SOB (shortness of breath) - Plan: Brain natriuretic peptide, EKG  12-Lead  Discussed with Dr. Laurance Flatten who agrees patient needs to go to ED and discussed w/ patient and EMS is called and he is placed on oxygen and taken to ED by EMS.  Lysbeth Penner FNP

## 2013-08-05 NOTE — ED Notes (Signed)
Per EMS pt was sent here from PCP with c/o increased SOB to rule out pulmonary edema. Pt sts gradual increase in shortness of breath, esp.with exertion, sts also lower extremity edema. O2 on RA 89%

## 2013-08-05 NOTE — ED Notes (Signed)
Bed: WA03 Expected date: 08/05/13 Expected time: 1:42 PM Means of arrival: Ambulance Comments: Shortness of breath

## 2013-08-06 DIAGNOSIS — I5021 Acute systolic (congestive) heart failure: Secondary | ICD-10-CM

## 2013-08-06 LAB — BASIC METABOLIC PANEL
Anion gap: 12 (ref 5–15)
BUN: 19 mg/dL (ref 6–23)
CALCIUM: 9 mg/dL (ref 8.4–10.5)
CO2: 28 mEq/L (ref 19–32)
CREATININE: 1.09 mg/dL (ref 0.50–1.35)
Chloride: 101 mEq/L (ref 96–112)
GFR, EST AFRICAN AMERICAN: 83 mL/min — AB (ref >90)
GFR, EST NON AFRICAN AMERICAN: 71 mL/min — AB (ref >90)
Glucose, Bld: 206 mg/dL — ABNORMAL HIGH (ref 70–99)
Potassium: 4.4 mEq/L (ref 3.7–5.3)
Sodium: 141 mEq/L (ref 137–147)

## 2013-08-06 LAB — GLUCOSE, CAPILLARY
GLUCOSE-CAPILLARY: 190 mg/dL — AB (ref 70–99)
GLUCOSE-CAPILLARY: 174 mg/dL — AB (ref 70–99)
GLUCOSE-CAPILLARY: 185 mg/dL — AB (ref 70–99)
Glucose-Capillary: 167 mg/dL — ABNORMAL HIGH (ref 70–99)

## 2013-08-06 LAB — PROTIME-INR
INR: 2 — ABNORMAL HIGH (ref 0.00–1.49)
PROTHROMBIN TIME: 22.7 s — AB (ref 11.6–15.2)

## 2013-08-06 MED ORDER — FUROSEMIDE 10 MG/ML IJ SOLN
40.0000 mg | Freq: Two times a day (BID) | INTRAMUSCULAR | Status: DC
  Administered 2013-08-06 – 2013-08-07 (×2): 40 mg via INTRAVENOUS
  Filled 2013-08-06 (×4): qty 4

## 2013-08-06 MED ORDER — WARFARIN SODIUM 10 MG PO TABS
10.0000 mg | ORAL_TABLET | Freq: Once | ORAL | Status: AC
  Administered 2013-08-06: 10 mg via ORAL
  Filled 2013-08-06: qty 1

## 2013-08-06 NOTE — Progress Notes (Signed)
Patient set up on CPAP via full face mask (pt wears at home) 7.0 cm H20 per home settings with 2 lpm O2 bleed in.  Patient tolerating well this time. RN aware.

## 2013-08-06 NOTE — Progress Notes (Signed)
[facility] PROGRESS NOTE  William Flores DHR:[id number] DOB: 13-Sep-1950 DOA: 08/05/2013  PCP: Anthoney Harada, MD  Brief HPI: 63yo with PMH as below and with atrial fibrillation, NICM presented with worsening shortness of breath over last 3 weeks mainly with exertion and with orthopnea. He was noted to have pulmonary edema. He was admitted for CHF exacerbation.  Past medical history:  Past Medical History  Diagnosis Date  . Diabetes   . Colon cancer   . Hypertension   . Sleep apnea   . Atrial fibrillation   . Hyperlipidemia     Consultants: None yet  Procedures: 2D ECHO is pending.  Antibiotics: None  Subjective: He feels better. Still some short of breath. Denies chest pain. No fever recently.   Objective: Vital Signs  Filed Vitals:   08/05/13 1852 08/06/13 0123 08/06/13 0521 08/06/13 0952  BP:  141/84 100/44 110/50  Pulse:  98 88   Temp:  98.3 F (36.8 C) 97.9 F (36.6 C)   TempSrc:  Oral Oral   Resp:  22 20   Height: 5' 4"  (1.626 m)     Weight: 134.446 kg (296 lb 6.4 oz)  132.813 kg (292 lb 12.8 oz)   SpO2:  96% 94%     Intake/Output Summary (Last 24 hours) at 08/06/13 1224 Last data filed at 08/06/13 1110  Gross per 24 hour  Intake    480 ml  Output   1625 ml  Net  -1145 ml   Filed Weights   08/05/13 1852 08/06/13 0521  Weight: 134.446 kg (296 lb 6.4 oz) 132.813 kg (292 lb 12.8 oz)   General appearance: alert, cooperative, appears stated age, no distress and moderately obese Resp: decreased air entry at bases. Crackles at bases. No wheezing. Cardio: irregularly irregular. no murmur, click, rub or gallop GI: soft, non-tender; bowel sounds normal; no masses,  no organomegaly Extremities: 1+ pitting edema bilateral LE Neurologic: No focal deficits.  Lab Results:  Basic Metabolic Panel:  Recent Labs Lab 08/05/13 1403 08/06/13 0450  NA 141 141  K 4.6 4.4  CL 104 101  CO2 26 28  GLUCOSE 127* 206*  BUN 19 19  CREATININE 0.98  1.09  CALCIUM 9.3 9.0   CBC:  Recent Labs Lab 08/05/13 1403  WBC 8.2  HGB 10.9*  HCT 36.1*  MCV 90.3  PLT 217   BNP (last 3 results)  Recent Labs  08/05/13 1403  PROBNP 376.6*   CBG:  Recent Labs Lab 08/05/13 2132 08/06/13 0555 08/06/13 1130  GLUCAP 217* 190* 174*   Studies/Results: Dg Chest 2 View  08/05/2013   CLINICAL DATA:  Shortness of breath  EXAM: CHEST  2 VIEW  COMPARISON:  Radiographs were obtained today at 12:33pm at [place]. These are available for comparison on [facility] PACS.  FINDINGS: New trace pleural effusions, interstitial edema, and central vascular congestion since previously. Moderate cardiomegaly is again noted.  IMPRESSION: New interstitial pulmonary edema and trace effusions with cardiomegaly again noted. Findings have developed since earlier today's comparison exam (please see prior on [facility] PACS from [facility]).   Electronically Signed   By: Conchita Paris M.D.   On: 08/05/2013 15:34   Dg Chest 2 View  08/05/2013   CLINICAL DATA:  Shortness of breath and cough  EXAM: CHEST  2 VIEW  COMPARISON:  To eighteenth to facet 8  FINDINGS: The heart size is at the upper limits of normal. Both lungs are clear. The  visualized skeletal structures are unremarkable.  IMPRESSION: No active cardiopulmonary disease.   Electronically Signed   By: Conchita Paris M.D.   On: 08/05/2013 15:14    Medications:  Scheduled: . allopurinol  100 mg Oral Daily  . allopurinol  300 mg Oral Daily  . aspirin EC  81 mg Oral Daily  . atorvastatin  20 mg Oral Daily  . diltiazem  240 mg Oral Daily  . furosemide  40 mg Intravenous BID  . insulin aspart  0-15 Units Subcutaneous TID WC  . insulin aspart  0-5 Units Subcutaneous QHS  . irbesartan  300 mg Oral Daily  . ketotifen  2 drop Both Eyes BID  . lisinopril  20 mg Oral Daily  . pantoprazole  40 mg Oral Daily  . pneumococcal 23 valent vaccine  0.5 mL Intramuscular Tomorrow-1000    . sodium chloride  3 mL Intravenous Q12H  . sulfacetamide  1 drop Left Eye 6 times per day  . warfarin  10 mg Oral ONCE-1800  . Warfarin - Pharmacist Dosing Inpatient   Does not apply q1800   Continuous:  ZDG:[id number], acetaminophen, albuterol  Assessment/Plan:  Principal Problem:   CHF (congestive heart failure) Active Problems:   A-fib   HTN (hypertension)   Type 2 diabetes mellitus   Hyperlipidemia   Morbid obesity   Anemia   OSA on CPAP    Acute Systolic CHF with Hypoxic respiratory failure  Continue diuresis for now. He is not quite back to baseline. ECHo is pending as well. He hasn't seen a cardiologist in many years. It appears that he was seen by [facility] in [facility]. He was thought to have NICM based on clean coronaries on cath in 2008. EF was 25-30%. Likely has hypertensive heart disease. He is on ACEI. Await repeat ECHO. Will likely need to consult cards. Continue O2.   A. fib  Rate controlled on Cardizem. Continue Coumadin. Dosing per pharmacy   Diabetes mellitus type 2 Holding Janumet. A1c of 7.5. On SSI.   Obstructive sleep apnea  CPAP   Hypertension  Continue lisinopril, Cardizem and Diovan-HCTZ   History of gout  Continue allopurinol   Anemia  Appears to be chronic. Has history of colon cancer status post resection.   Code Status: Full Code  DVT Prophylaxis: On warfarin    Family Communication: Discussed with patient.  Disposition Plan: Not ready for discharge    LOS: 1 day   [facility] Pager [phone] 08/06/2013, 12:24 PM  If 8PM-8AM, please contact night-coverage at [email or web address], password [email or web address]   Disclaimer: This note was dictated with voice recognition software. Similar sounding words can inadvertently be transcribed and may not be corrected upon review.

## 2013-08-06 NOTE — Progress Notes (Signed)
ANTICOAGULATION CONSULT NOTE - Follow-up Consult  Pharmacy Consult for coumadin Indication: atrial fibrillation  Allergies  Allergen Reactions  . Codeine     Headache     Patient Measurements: Height: 5' 4"  (162.6 cm) Weight: 292 lb 12.8 oz (132.813 kg) (A SCALE) IBW/kg (Calculated) : 59.2   Vital Signs: Temp: 97.9 F (36.6 C) (08/04 0521) Temp src: Oral (08/04 0521) BP: 110/50 mmHg (08/04 0952) Pulse Rate: 88 (08/04 0521)  Labs:  Recent Labs  08/05/13 1403 08/06/13 0450  HGB 10.9*  --   HCT 36.1*  --   PLT 217  --   LABPROT 25.4* 22.7*  INR 2.31* 2.00*  CREATININE 0.98 1.09    Estimated Creatinine Clearance: 89.2 ml/min (by C-G formula based on Cr of 1.09).  Assessment: 63 yo man to continue coumadin for afib.  INR is therapeutic at 2 (but at low end of therapeutic).  Hg 10.9, PTLC 217. No bleeding noted. Home dose: 7.2m T/T/S/S and 141mM/W/F  Goal of Therapy:  INR 2-3 Monitor platelets by anticoagulation protocol: Yes   Plan:  Coumadin 10 mg again today - likely can restart home regimen tomorrow Daily PT/INR  Thanks for allowing pharmacy to be a part of this patient's care.  CaSherlon HandingPharmD, BCPS Clinical pharmacist, pager [phone]/04/2013,10:00 AM

## 2013-08-06 NOTE — Progress Notes (Signed)
UR completed Alana Dayton K. Lluvia Gwynne, RN, BSN, [place], CCM  08/06/2013 10:41 AM

## 2013-08-06 NOTE — Care Management Note (Addendum)
  Page 2 of 2   08/09/2013     2:11:30 PM CARE MANAGEMENT NOTE 08/09/2013  Patient:  William Flores, William Flores   Account Number:  [id number]  Date Initiated:  08/06/2013  Documentation initiated by:  Von Quintanar  Subjective/Objective Assessment:   CHF     Action/Plan:   CM to follow for dispositon needs   Anticipated DC Date:  08/09/2013   Anticipated DC Plan:  [place]  CM consult  Follow-up appt scheduled      PAC Choice  NA   Choice offered to / List presented to:     DME arranged  CPAP      DME agency  [facility].        Status of service:  Completed, signed off Medicare Important Message given?  YES (If response is "NO", the following Medicare IM given date fields will be blank) Date Medicare IM given:  08/08/2013 Medicare IM given by:  Marguerite Jarboe Date Additional Medicare IM given:  08/09/2013 Additional Medicare IM given by:  Kilian Schwartz  Discharge Disposition:  HOME/SELF CARE  Per UR Regulation:  Reviewed for med. necessity/level of care/duration of stay  If discussed at [place] of Stay Meetings, dates discussed:    Comments:  Joanna Borawski RN, BSN, MSHL, CCM  Nurse - Case Manager,  (Unit [phone]  08/09/2013 [facility] will provide CPap until patients sleep study 09/05/2013   Reema Chick RN, BSN, MSHL, CCM  Nurse - Case Manager,  (Unit [facility])  [phone]  08/08/2013 CM met with patient to discuss CPap mask needs Patient states hx/o CPap x 8 years but has not had a mask for 1 year d/t original provider no longer in service. Ptient unable to provide name of provider or CPap machine. CM scheduled OP Sleep Study appt: Sleep Study appointment Thursday - September 05, 2013 - Center to mail out patient package for completion and instructions. Sleep Study Appointment: [facility] [place], [place] [address] [phone] States patient will need  updated study as provider will require for coverage of CPAP equipment.    Fayne Mcguffee RN, BSN, MSHL, CCM  Nurse - Case Manager,  (Unit [place])  [phone]  08/06/2013 Social:  From Home alone hx/o patient was provided [facility] agency list in ER as resource for CPAP mask provider. (SEE ED CM NOTE) Dispositon Plan:  Home / Self care

## 2013-08-07 DIAGNOSIS — I517 Cardiomegaly: Secondary | ICD-10-CM

## 2013-08-07 LAB — BASIC METABOLIC PANEL
Anion gap: 9 (ref 5–15)
BUN: 23 mg/dL (ref 6–23)
CO2: 30 mEq/L (ref 19–32)
Calcium: 8.9 mg/dL (ref 8.4–10.5)
Chloride: 101 mEq/L (ref 96–112)
Creatinine, Ser: 1.28 mg/dL (ref 0.50–1.35)
GFR calc Af Amer: 68 mL/min — ABNORMAL LOW (ref >90)
GFR calc non Af Amer: 59 mL/min — ABNORMAL LOW (ref >90)
Glucose, Bld: 198 mg/dL — ABNORMAL HIGH (ref 70–99)
Potassium: 4.9 mEq/L (ref 3.7–5.3)
Sodium: 140 mEq/L (ref 137–147)

## 2013-08-07 LAB — CBC
HCT: 38.2 % — ABNORMAL LOW (ref 39.0–52.0)
Hemoglobin: 10.9 g/dL — ABNORMAL LOW (ref 13.0–17.0)
MCH: 27 pg (ref 26.0–34.0)
MCHC: 28.5 g/dL — AB (ref 30.0–36.0)
MCV: 94.8 fL (ref 78.0–100.0)
PLATELETS: 212 10*3/uL (ref 150–400)
RBC: 4.03 MIL/uL — ABNORMAL LOW (ref 4.22–5.81)
RDW: 17.8 % — AB (ref 11.5–15.5)
WBC: 6 10*3/uL (ref 4.0–10.5)

## 2013-08-07 LAB — PROTIME-INR
INR: 2.43 — ABNORMAL HIGH (ref 0.00–1.49)
Prothrombin Time: 26.4 seconds — ABNORMAL HIGH (ref 11.6–15.2)

## 2013-08-07 LAB — GLUCOSE, CAPILLARY
Glucose-Capillary: 171 mg/dL — ABNORMAL HIGH (ref 70–99)
Glucose-Capillary: 261 mg/dL — ABNORMAL HIGH (ref 70–99)
Glucose-Capillary: 128 mg/dL — ABNORMAL HIGH (ref 70–99)
Glucose-Capillary: 219 mg/dL — ABNORMAL HIGH (ref 70–99)

## 2013-08-07 MED ORDER — FUROSEMIDE 10 MG/ML IJ SOLN
40.0000 mg | Freq: Three times a day (TID) | INTRAMUSCULAR | Status: DC
  Administered 2013-08-07 – 2013-08-08 (×5): 40 mg via INTRAVENOUS
  Filled 2013-08-07 (×6): qty 4

## 2013-08-07 MED ORDER — LISINOPRIL 20 MG PO TABS
20.0000 mg | ORAL_TABLET | Freq: Every day | ORAL | Status: DC
  Administered 2013-08-08 – 2013-08-10 (×3): 20 mg via ORAL
  Filled 2013-08-07 (×3): qty 1

## 2013-08-07 MED ORDER — WARFARIN SODIUM 7.5 MG PO TABS
7.5000 mg | ORAL_TABLET | ORAL | Status: DC
  Filled 2013-08-07: qty 1

## 2013-08-07 MED ORDER — WARFARIN SODIUM 10 MG PO TABS
10.0000 mg | ORAL_TABLET | ORAL | Status: DC
  Administered 2013-08-07 – 2013-08-09 (×2): 10 mg via ORAL
  Filled 2013-08-07 (×3): qty 1

## 2013-08-07 NOTE — Progress Notes (Signed)
ANTICOAGULATION CONSULT NOTE - Follow-up Consult  Pharmacy Consult for coumadin Indication: atrial fibrillation  Allergies  Allergen Reactions  . Codeine     Headache     Patient Measurements: Height: 5' 4"  (162.6 cm) Weight: 293 lb 4.8 oz (133.04 kg) (scale a) IBW/kg (Calculated) : 59.2   Vital Signs: Temp: 97.3 F (36.3 C) (08/05 0606) Temp src: Axillary (08/05 0606) BP: 110/52 mmHg (08/05 0951) Pulse Rate: 76 (08/05 0606)  Labs:  Recent Labs  08/05/13 1403 08/06/13 0450 08/07/13 0404  HGB 10.9*  --   --   HCT 36.1*  --   --   PLT 217  --   --   LABPROT 25.4* 22.7* 26.4*  INR 2.31* 2.00* 2.43*  CREATININE 0.98 1.09  --     Estimated Creatinine Clearance: 89.3 ml/min (by C-G formula based on Cr of 1.09).  Assessment: 63 yo man to continue coumadin for afib.  INR remains therapeutic at 2.43. Hg 10.9, PTLC 217. No bleeding noted. Home dose: 7.100m T/T/S/S and 165mM/W/F  Goal of Therapy:  INR 2-3 Monitor platelets by anticoagulation protocol: Yes   Plan:  -Resume home dose of Coumadin  -Daily PT/INR  BeAlbertina ParrPharmD.  Clinical Pharmacist Pager [phone]

## 2013-08-07 NOTE — Progress Notes (Signed)
  Echocardiogram 2D Echocardiogram has been performed.  Mauricio Po 08/07/2013, 2:06 PM

## 2013-08-07 NOTE — Progress Notes (Signed)
[facility] PROGRESS NOTE  William Flores JFH:[id number] DOB: 07-01-1950 DOA: 08/05/2013  PCP: Anthoney Harada, MD  Brief HPI: 63yo with PMH as below and with atrial fibrillation, NICM presented with worsening shortness of breath over last 3 weeks mainly with exertion and with orthopnea. He was noted to have pulmonary edema. He was admitted for CHF exacerbation.   Acute Systolic CHF with Hypoxic respiratory failure  Continue diuresis for now.  ECHo is pending as well.  EF was 25-30% last ECHO. Marland Kitchen Likely has hypertensive heart disease. He is on ACEI.  Weight: 134----132-------133 Increase lasix from 40 mg IV BID to TID.   A. fib  Rate controlled on Cardizem. Continue Coumadin. Dosing per pharmacy   Diabetes mellitus type 2 Holding Janumet. A1c of 7.5. On SSI.   Obstructive sleep apnea  CPAP   Hypertension  Continue lisinopril, Cardizem. Hold Diovan-HCTZ patient now on lasix.   History of gout  Continue allopurinol   Anemia  Appears to be chronic. Has history of colon cancer status post resection.   Code Status: Full Code  DVT Prophylaxis: On warfarin    Family Communication: Discussed with patient.  Disposition Plan: Not ready for discharge   Consultants: None yet  Procedures: 2D ECHO is pending.  Antibiotics: None  Subjective: He feels better. Still some short of breath specially on exertion.   Objective: Vital Signs  Filed Vitals:   08/07/13 0130 08/07/13 0606 08/07/13 0951 08/07/13 1451  BP: 116/73 117/67 110/52 139/110  Pulse: 80 76  84  Temp: 98.1 F (36.7 C) 97.3 F (36.3 C)  98.9 F (37.2 C)  TempSrc: Axillary Axillary  Oral  Resp: 22 22  20   Height:      Weight:  133.04 kg (293 lb 4.8 oz)    SpO2: 95%   95%    Intake/Output Summary (Last 24 hours) at 08/07/13 1531 Last data filed at 08/07/13 1446  Gross per 24 hour  Intake    960 ml  Output   1100 ml  Net   -140 ml   Filed Weights   08/05/13 1852 08/06/13 0521 08/07/13  0606  Weight: 134.446 kg (296 lb 6.4 oz) 132.813 kg (292 lb 12.8 oz) 133.04 kg (293 lb 4.8 oz)   General appearance: alert, cooperative, appears stated age, no distress and moderately obese Resp: decreased air entry at bases. Crackles at bases. No wheezing. Cardio: irregularly irregular. no murmur, click, rub or gallop GI: soft, non-tender; bowel sounds normal; no masses,  no organomegaly Extremities: 1+ pitting edema bilateral LE   Lab Results:  Basic Metabolic Panel:  Recent Labs Lab 08/05/13 1403 08/06/13 0450 08/07/13 1227  NA 141 141 140  K 4.6 4.4 4.9  CL 104 101 101  CO2 26 28 30   GLUCOSE 127* 206* 198*  BUN 19 19 23   CREATININE 0.98 1.09 1.28  CALCIUM 9.3 9.0 8.9   CBC:  Recent Labs Lab 08/05/13 1403 08/07/13 1227  WBC 8.2 6.0  HGB 10.9* 10.9*  HCT 36.1* 38.2*  MCV 90.3 94.8  PLT 217 212   BNP (last 3 results)  Recent Labs  08/05/13 1403  PROBNP 376.6*   CBG:  Recent Labs Lab 08/06/13 1130 08/06/13 1641 08/06/13 2127 08/07/13 0540 08/07/13 1110  GLUCAP 174* 185* 167* 171* 261*   Studies/Results: No results found.  Medications:  Scheduled: . allopurinol  100 mg Oral Daily  . allopurinol  300 mg Oral Daily  . aspirin EC  81 mg Oral Daily  .  atorvastatin  20 mg Oral Daily  . diltiazem  240 mg Oral Daily  . furosemide  40 mg Intravenous BID  . insulin aspart  0-15 Units Subcutaneous TID WC  . insulin aspart  0-5 Units Subcutaneous QHS  . ketotifen  2 drop Both Eyes BID  . [START ON 08/08/2013] lisinopril  20 mg Oral Daily  . pantoprazole  40 mg Oral Daily  . sodium chloride  3 mL Intravenous Q12H  . sulfacetamide  1 drop Left Eye 6 times per day  . warfarin  10 mg Oral Once per day on Mon Wed Fri  . [START ON 08/08/2013] warfarin  7.5 mg Oral Once per day on Sun Tue Thu Sat  . Warfarin - Pharmacist Dosing Inpatient   Does not apply q1800   Continuous:  VAE:[id number], acetaminophen, albuterol  Assessment/Plan:  Principal  Problem:   CHF (congestive heart failure) Active Problems:   A-fib   HTN (hypertension)   Type 2 diabetes mellitus   Hyperlipidemia   Morbid obesity   Anemia   OSA on CPAP     LOS: 2 days   Elmarie Shiley [phone]  [facility] 08/07/2013, 3:31 PM  If 8PM-8AM, please contact night-coverage at [email or web address], password [email or web address]   Disclaimer: This note was dictated with voice recognition software. Similar sounding words can inadvertently be transcribed and may not be corrected upon review.

## 2013-08-08 LAB — GLUCOSE, CAPILLARY
GLUCOSE-CAPILLARY: 165 mg/dL — AB (ref 70–99)
GLUCOSE-CAPILLARY: 184 mg/dL — AB (ref 70–99)
Glucose-Capillary: 187 mg/dL — ABNORMAL HIGH (ref 70–99)
Glucose-Capillary: 150 mg/dL — ABNORMAL HIGH (ref 70–99)

## 2013-08-08 LAB — BASIC METABOLIC PANEL
ANION GAP: 10 (ref 5–15)
BUN: 27 mg/dL — ABNORMAL HIGH (ref 6–23)
CHLORIDE: 101 meq/L (ref 96–112)
CO2: 32 meq/L (ref 19–32)
Calcium: 9.2 mg/dL (ref 8.4–10.5)
Creatinine, Ser: 1.34 mg/dL (ref 0.50–1.35)
GFR calc Af Amer: 64 mL/min — ABNORMAL LOW (ref >90)
GFR calc non Af Amer: 56 mL/min — ABNORMAL LOW (ref >90)
GLUCOSE: 146 mg/dL — AB (ref 70–99)
Potassium: 5.1 mEq/L (ref 3.7–5.3)
Sodium: 143 mEq/L (ref 137–147)

## 2013-08-08 LAB — PROTIME-INR
INR: 2.84 — ABNORMAL HIGH (ref 0.00–1.49)
Prothrombin Time: 29.8 seconds — ABNORMAL HIGH (ref 11.6–15.2)

## 2013-08-08 MED ORDER — WARFARIN SODIUM 7.5 MG PO TABS
7.5000 mg | ORAL_TABLET | ORAL | Status: DC
  Administered 2013-08-08: 7.5 mg via ORAL
  Filled 2013-08-08 (×2): qty 1

## 2013-08-08 NOTE — Progress Notes (Signed)
Placed patient on CPAP with O2 bled in. Patient tolerating well.

## 2013-08-08 NOTE — Progress Notes (Signed)
ANTICOAGULATION CONSULT NOTE - Follow-up Consult  Pharmacy Consult for coumadin Indication: atrial fibrillation  Allergies  Allergen Reactions  . Codeine     Headache     Patient Measurements: Height: 5' 4"  (162.6 cm) Weight: 291 lb 0.1 oz (132 kg) IBW/kg (Calculated) : 59.2   Vital Signs: Temp: 97.8 F (36.6 C) (08/06 0530) Temp src: Oral (08/06 0530) BP: 127/68 mmHg (08/06 0530) Pulse Rate: 70 (08/06 0530)  Labs:  Recent Labs  08/05/13 1403 08/06/13 0450 08/07/13 0404 08/07/13 1227 08/08/13 0438  HGB 10.9*  --   --  10.9*  --   HCT 36.1*  --   --  38.2*  --   PLT 217  --   --  212  --   LABPROT 25.4* 22.7* 26.4*  --  29.8*  INR 2.31* 2.00* 2.43*  --  2.84*  CREATININE 0.98 1.09  --  1.28 1.34    Estimated Creatinine Clearance: 72.3 ml/min (by C-G formula based on Cr of 1.34).  Assessment: 63 yo man to continue coumadin for afib.  INR remains therapeutic at 2.84. Hg 10.9, PTLC 217. No bleeding noted. Home dose: 7.79m T/T/S/S and 145mM/W/F  Goal of Therapy:  INR 2-3 Monitor platelets by anticoagulation protocol: Yes   Plan:  -Cont home dose of Coumadin  -Daily PT/INR  LoExcell SeltzerPharmD.  Clinical Pharmacist Pager [phone]

## 2013-08-08 NOTE — Progress Notes (Signed)
[facility] PROGRESS NOTE  William Flores [id number]:[id number] DOB: 08-03-1950 DOA: 08/05/2013  PCP: Anthoney Harada, MD  Brief HPI: 63yo with PMH as below and with atrial fibrillation, NICM presented with worsening shortness of breath over last 3 weeks mainly with exertion and with orthopnea. He was noted to have pulmonary edema. He was admitted for CHF exacerbation.   1-Acute Diastolic CHF with Hypoxic respiratory failure  Continue diuresis for now.  ECHO 2015:  EF 55 to 60 %  ECHO 2008 ; 25-30% . Marland Kitchen Likely has hypertensive heart disease. He is on ACEI.  Weight: 134----132-------133---132 Continue with lasix from 40 mg IV to TID.   A. fib  Rate controlled on Cardizem. Continue Coumadin. Dosing per pharmacy   Diabetes mellitus type 2 Holding Janumet. A1c of 7.5. On SSI.   Obstructive sleep apnea  CPAP  Need Outpatient sleep study. CM has arrange outpatient sleep study.   Hypertension  Continue lisinopril, Cardizem. Hold Diovan-HCTZ patient now on lasix.   History of gout  Continue allopurinol   Anemia  Appears to be chronic. Has history of colon cancer status post resection.   Code Status: Full Code  DVT Prophylaxis: On warfarin    Family Communication: Discussed with patient.  Disposition Plan: Not ready for discharge   Consultants: None yet  Procedures: 2D ECHO is pending.  Antibiotics: None  Subjective: He feels better. Breathing  Better.   Objective: Vital Signs  Filed Vitals:   08/07/13 1451 08/07/13 2115 08/08/13 0530 08/08/13 0905  BP: 139/110 118/85 127/68 125/63  Pulse: 84 82 70 85  Temp: 98.9 F (37.2 C) 97.5 F (36.4 C) 97.8 F (36.6 C) 98.5 F (36.9 C)  TempSrc: Oral Oral Oral Oral  Resp: 20 20 20 20   Height:      Weight:   132 kg (291 lb 0.1 oz)   SpO2: 95% 97% 97%     Intake/Output Summary (Last 24 hours) at 08/08/13 1416 Last data filed at 08/08/13 1251  Gross per 24 hour  Intake   1200 ml  Output   2340 ml  Net  -1140  ml   Filed Weights   08/06/13 0521 08/07/13 0606 08/08/13 0530  Weight: 132.813 kg (292 lb 12.8 oz) 133.04 kg (293 lb 4.8 oz) 132 kg (291 lb 0.1 oz)   General appearance: alert, cooperative, appears stated age, no distress and moderately obese Resp:  Crackles at bases. No wheezing. Cardio: irregularly irregular. no murmur, click, rub or gallop GI: soft, non-tender; bowel sounds normal; no masses,  no organomegaly Extremities: 1+ pitting edema bilateral LE   Lab Results:  Basic Metabolic Panel:  Recent Labs Lab 08/05/13 1403 08/06/13 0450 08/07/13 1227 08/08/13 0438  NA 141 141 140 143  K 4.6 4.4 4.9 5.1  CL 104 101 101 101  CO2 26 28 30  32  GLUCOSE 127* 206* 198* 146*  BUN 19 19 23  27*  CREATININE 0.98 1.09 1.28 1.34  CALCIUM 9.3 9.0 8.9 9.2   CBC:  Recent Labs Lab 08/05/13 1403 08/07/13 1227  WBC 8.2 6.0  HGB 10.9* 10.9*  HCT 36.1* 38.2*  MCV 90.3 94.8  PLT 217 212   BNP (last 3 results)  Recent Labs  08/05/13 1403  PROBNP 376.6*   CBG:  Recent Labs Lab 08/07/13 1110 08/07/13 1626 08/07/13 2109 08/08/13 0622 08/08/13 1129  GLUCAP 261* 128* 219* 187* 165*   Studies/Results: No results found.  Medications:  Scheduled: . allopurinol  100 mg Oral Daily  .  allopurinol  300 mg Oral Daily  . aspirin EC  81 mg Oral Daily  . atorvastatin  20 mg Oral Daily  . diltiazem  240 mg Oral Daily  . furosemide  40 mg Intravenous TID  . insulin aspart  0-15 Units Subcutaneous TID WC  . insulin aspart  0-5 Units Subcutaneous QHS  . lisinopril  20 mg Oral Daily  . pantoprazole  40 mg Oral Daily  . sodium chloride  3 mL Intravenous Q12H  . warfarin  10 mg Oral Once per day on Mon Wed Fri  . warfarin  7.5 mg Oral Once per day on Sun Tue Thu Sat  . Warfarin - Pharmacist Dosing Inpatient   Does not apply q1800   Continuous:  YBW:[id number], acetaminophen, albuterol  Assessment/Plan:  Principal Problem:   CHF (congestive heart failure) Active  Problems:   A-fib   HTN (hypertension)   Type 2 diabetes mellitus   Hyperlipidemia   Morbid obesity   Anemia   OSA on CPAP     LOS: 3 days   Niel Hummer [phone]  [facility] 08/08/2013, 2:16 PM  If 8PM-8AM, please contact night-coverage at [email or web address], password [email or web address]   Disclaimer: This note was dictated with voice recognition software. Similar sounding words can inadvertently be transcribed and may not be corrected upon review.

## 2013-08-08 NOTE — Progress Notes (Signed)
Inpatient Diabetes Program Recommendations  AACE/ADA: New Consensus Statement on Inpatient Glycemic Control (2013)  Target Ranges:  Prepandial:   less than 140 mg/dL      Peak postprandial:   less than 180 mg/dL (1-2 hours)      Critically ill patients:  140 - 180 mg/dL   Reason for Assessment:  Results for William Flores, William Flores (MRN [id number]) as of 08/08/2013 11:31  Ref. Range 08/07/2013 05:40 08/07/2013 11:10 08/07/2013 16:26 08/07/2013 21:09 08/08/2013 06:22  Glucose-Capillary Latest Range: 70-99 mg/dL 171 (H) 261 (H) 128 (H) 219 (H) 187 (H)  Results for William Flores, William Flores (MRN [id number]) as of 08/08/2013 11:31  Ref. Range 07/18/2013 16:04  Hemoglobin A1C No range found 7.5 %   Diabetes history: Type 2 Diabetes Outpatient Diabetes medications: Janumet 50-1000 mg bid Current orders for Inpatient glycemic control:  Novolog moderate tid with meals and HS  Note: Oral diabetes medications currently on hold while in the hospital.  May consider adding low dose basal insulin while in the hospital.  Consider Levemir 15 units daily.    Thanks, Adah Perl, RN, BC-ADM Inpatient Diabetes Coordinator Pager [phone]

## 2013-08-09 LAB — BASIC METABOLIC PANEL
ANION GAP: 10 (ref 5–15)
BUN: 28 mg/dL — ABNORMAL HIGH (ref 6–23)
CO2: 33 meq/L — AB (ref 19–32)
CREATININE: 1.31 mg/dL (ref 0.50–1.35)
Calcium: 9 mg/dL (ref 8.4–10.5)
Chloride: 99 mEq/L (ref 96–112)
GFR calc Af Amer: 66 mL/min — ABNORMAL LOW (ref >90)
GFR, EST NON AFRICAN AMERICAN: 57 mL/min — AB (ref >90)
GLUCOSE: 147 mg/dL — AB (ref 70–99)
Potassium: 4.8 mEq/L (ref 3.7–5.3)
Sodium: 142 mEq/L (ref 137–147)

## 2013-08-09 LAB — GLUCOSE, CAPILLARY
GLUCOSE-CAPILLARY: 204 mg/dL — AB (ref 70–99)
GLUCOSE-CAPILLARY: 146 mg/dL — AB (ref 70–99)
Glucose-Capillary: 146 mg/dL — ABNORMAL HIGH (ref 70–99)
Glucose-Capillary: 197 mg/dL — ABNORMAL HIGH (ref 70–99)

## 2013-08-09 LAB — PROTIME-INR
INR: 2.35 — AB (ref 0.00–1.49)
Prothrombin Time: 25.7 seconds — ABNORMAL HIGH (ref 11.6–15.2)

## 2013-08-09 MED ORDER — FUROSEMIDE 40 MG PO TABS
40.0000 mg | ORAL_TABLET | Freq: Two times a day (BID) | ORAL | Status: DC
  Administered 2013-08-09 – 2013-08-10 (×3): 40 mg via ORAL
  Filled 2013-08-09 (×5): qty 1

## 2013-08-09 MED ORDER — HYDROCORTISONE 1 % EX CREA
TOPICAL_CREAM | Freq: Two times a day (BID) | CUTANEOUS | Status: DC
  Administered 2013-08-09 – 2013-08-10 (×2): via TOPICAL
  Filled 2013-08-09: qty 28

## 2013-08-09 NOTE — Progress Notes (Signed)
ANTICOAGULATION CONSULT NOTE - Follow-up Consult  Pharmacy Consult for coumadin Indication: atrial fibrillation  Allergies  Allergen Reactions  . Codeine     Headache     Patient Measurements: Height: 5' 4"  (162.6 cm) Weight: 290 lb 11.9 oz (131.88 kg) (Scale A) IBW/kg (Calculated) : 59.2   Vital Signs: Temp: 98 F (36.7 C) (08/07 0629) Temp src: Oral (08/07 0629) BP: 128/72 mmHg (08/07 0629) Pulse Rate: 72 (08/07 0629)  Labs:  Recent Labs  08/07/13 0404 08/07/13 1227 08/08/13 0438 08/09/13 0450  HGB  --  10.9*  --   --   HCT  --  38.2*  --   --   PLT  --  212  --   --   LABPROT 26.4*  --  29.8* 25.7*  INR 2.43*  --  2.84* 2.35*  CREATININE  --  1.28 1.34 1.31    Estimated Creatinine Clearance: 74 ml/min (by C-G formula based on Cr of 1.31).  Assessment: 63 yo man to continue coumadin for afib.  INR remains therapeutic at 2.35. Hg 10.9, PTLC 217. No bleeding noted. Home dose: 7.4m T/T/S/S and 133mM/W/F  Goal of Therapy:  INR 2-3 Monitor platelets by anticoagulation protocol: Yes   Plan:  -Cont home dose of Coumadin  -Daily PT/INR  LoExcell SeltzerPharmD.  Clinical Pharmacist Pager [phone]

## 2013-08-09 NOTE — Progress Notes (Signed)
UR completed Aileena Iglesia K. Rommel Hogston, RN, BSN, [place], CCM  08/09/2013 2:16 PM

## 2013-08-09 NOTE — Progress Notes (Signed)
[facility] PROGRESS NOTE  William Flores HMC:[id number] DOB: 02-11-1950 DOA: 08/05/2013  PCP: Anthoney Harada, MD  Brief HPI: 63yo with PMH as below and with atrial fibrillation, NICM presented with worsening shortness of breath over last 3 weeks mainly with exertion and with orthopnea. He was noted to have pulmonary edema. He was admitted for CHF exacerbation.   1-Acute Diastolic CHF with Hypoxic respiratory failure  Continue diuresis for now.  ECHO 2015:  EF 55 to 60 %  ECHO 2008 ; 25-30% . Marland Kitchen Likely has hypertensive heart disease. He is on ACEI.  Weight: 134----132-------133---132---131 Change IV lasix 40 mg TID to oral 40 mg BID.   A. fib  Rate controlled on Cardizem. Continue Coumadin. Dosing per pharmacy   Diabetes mellitus type 2 Holding Janumet. A1c of 7.5. On SSI.   Obstructive sleep apnea  CPAP  Need Outpatient sleep study. CM has arrange outpatient sleep study.   Hypertension  Continue lisinopril, Cardizem. Hold Diovan-HCTZ patient now on lasix.   History of gout  Continue allopurinol   Anemia  Appears to be chronic. Has history of colon cancer status post resection.   Code Status: Full Code  DVT Prophylaxis: On warfarin    Family Communication: Discussed with patient.  Disposition Plan: home in 24 hours.   Consultants: None yet  Procedures: 2D ECHO is pending.  Antibiotics: None  Subjective: He feels better. Breathing  Better.   Objective: Vital Signs  Filed Vitals:   08/08/13 2141 08/09/13 0629 08/09/13 1052 08/09/13 1403  BP: 126/68 128/72 119/72 103/68  Pulse: 70 72 74 71  Temp: 98.3 F (36.8 C) 98 F (36.7 C) 98.6 F (37 C) 99.2 F (37.3 C)  TempSrc: Oral Oral Oral Oral  Resp: 20 20 20 20   Height:      Weight:  131.88 kg (290 lb 11.9 oz)    SpO2: 100% 100% 93% 91%    Intake/Output Summary (Last 24 hours) at 08/09/13 1636 Last data filed at 08/09/13 1403  Gross per 24 hour  Intake    840 ml  Output   2225 ml  Net   -1385 ml   Filed Weights   08/07/13 0606 08/08/13 0530 08/09/13 0629  Weight: 133.04 kg (293 lb 4.8 oz) 132 kg (291 lb 0.1 oz) 131.88 kg (290 lb 11.9 oz)   General appearance: alert, cooperative, appears stated age, no distress and moderately obese Resp:  Crackles at bases. No wheezing. Cardio: irregularly irregular. no murmur, click, rub or gallop GI: soft, non-tender; bowel sounds normal; no masses,  no organomegaly Extremities: 1+ pitting edema bilateral LE   Lab Results:  Basic Metabolic Panel:  Recent Labs Lab 08/05/13 1403 08/06/13 0450 08/07/13 1227 08/08/13 0438 08/09/13 0450  NA 141 141 140 143 142  K 4.6 4.4 4.9 5.1 4.8  CL 104 101 101 101 99  CO2 26 28 30  32 33*  GLUCOSE 127* 206* 198* 146* 147*  BUN 19 19 23  27* 28*  CREATININE 0.98 1.09 1.28 1.34 1.31  CALCIUM 9.3 9.0 8.9 9.2 9.0   CBC:  Recent Labs Lab 08/05/13 1403 08/07/13 1227  WBC 8.2 6.0  HGB 10.9* 10.9*  HCT 36.1* 38.2*  MCV 90.3 94.8  PLT 217 212   BNP (last 3 results)  Recent Labs  08/05/13 1403  PROBNP 376.6*   CBG:  Recent Labs Lab 08/08/13 1658 08/08/13 2147 08/09/13 0635 08/09/13 1131 08/09/13 1616  GLUCAP 184* 150* 146* 204* 146*   Studies/Results: No results  found.  Medications:  Scheduled: . allopurinol  100 mg Oral Daily  . allopurinol  300 mg Oral Daily  . aspirin EC  81 mg Oral Daily  . atorvastatin  20 mg Oral Daily  . diltiazem  240 mg Oral Daily  . furosemide  40 mg Oral BID  . hydrocortisone cream   Topical BID  . insulin aspart  0-15 Units Subcutaneous TID WC  . insulin aspart  0-5 Units Subcutaneous QHS  . lisinopril  20 mg Oral Daily  . pantoprazole  40 mg Oral Daily  . sodium chloride  3 mL Intravenous Q12H  . warfarin  10 mg Oral Once per day on Mon Wed Fri  . warfarin  7.5 mg Oral Once per day on Sun Tue Thu Sat  . Warfarin - Pharmacist Dosing Inpatient   Does not apply q1800   Continuous:  JFT:[id number], acetaminophen,  albuterol  Assessment/Plan:  Principal Problem:   CHF (congestive heart failure) Active Problems:   A-fib   HTN (hypertension)   Type 2 diabetes mellitus   Hyperlipidemia   Morbid obesity   Anemia   OSA on CPAP     LOS: 4 days   Niel Hummer [phone]  [facility] 08/09/2013, 4:36 PM  If 8PM-8AM, please contact night-coverage at [email or web address], password [email or web address]   Disclaimer: This note was dictated with voice recognition software. Similar sounding words can inadvertently be transcribed and may not be corrected upon review.

## 2013-08-09 NOTE — Progress Notes (Signed)
Pt does not want to wear the CPAP at this time.

## 2013-08-10 LAB — BASIC METABOLIC PANEL
Anion gap: 9 (ref 5–15)
BUN: 30 mg/dL — ABNORMAL HIGH (ref 6–23)
CO2: 32 mEq/L (ref 19–32)
Calcium: 8.8 mg/dL (ref 8.4–10.5)
Chloride: 98 mEq/L (ref 96–112)
Creatinine, Ser: 1.1 mg/dL (ref 0.50–1.35)
GFR, EST AFRICAN AMERICAN: 82 mL/min — AB (ref >90)
GFR, EST NON AFRICAN AMERICAN: 71 mL/min — AB (ref >90)
Glucose, Bld: 185 mg/dL — ABNORMAL HIGH (ref 70–99)
Potassium: 4.5 mEq/L (ref 3.7–5.3)
Sodium: 139 mEq/L (ref 137–147)

## 2013-08-10 LAB — PROTIME-INR
INR: 2.26 — ABNORMAL HIGH (ref 0.00–1.49)
PROTHROMBIN TIME: 25 s — AB (ref 11.6–15.2)

## 2013-08-10 LAB — GLUCOSE, CAPILLARY
GLUCOSE-CAPILLARY: 196 mg/dL — AB (ref 70–99)
Glucose-Capillary: 165 mg/dL — ABNORMAL HIGH (ref 70–99)

## 2013-08-10 MED ORDER — FUROSEMIDE 40 MG PO TABS: 40.0000 mg | ORAL_TABLET | Freq: Two times a day (BID) | ORAL | Status: DC

## 2013-08-10 NOTE — Discharge Summary (Signed)
Physician Discharge Summary  William Flores TOI:[id number] DOB: [birth year] DOA: 08/05/2013  PCP: Anthoney Harada, MD  Admit date: 08/05/2013 Discharge date: 08/10/2013  Time spent: 35 minutes.   Recommendations for Outpatient Follow-up:  1. Needs B-met to follow renal function.  2. Needs further titration of medications for heart failure. 3. Needs to follow up for sleep study.   Discharge Diagnoses:    Diastolic CHF (congestive heart failure) exacerbation.    A-fib   HTN (hypertension)   Type 2 diabetes mellitus   Hyperlipidemia   Morbid obesity   Anemia   OSA on CPAP   Discharge Condition: Stable.   Diet recommendation: Heart Healthy  Filed Weights   08/08/13 0530 08/09/13 0629 08/10/13 0455  Weight: 132 kg (291 lb 0.1 oz) 131.88 kg (290 lb 11.9 oz) 130.7 kg (288 lb 2.3 oz)    History of present illness:  63 year old obese male with a history of nonischemic cardiomyopathy as per cardiac cath in 2000 and, A. fib on Coumadin, hypertension, hyperlipidemia, coronary artery disease, obstructive sleep apnea (on CPAP with either lead at the in use) history of colon cancer with resection, type 2 diabetes mellitus who was seen at his PCP office at [facility] family [facility] this morning for shortness of breath. Patient reports that for past 3 weeks he has been progressively been short of breath with exertion. Prior to this he had been fairly active and ambulatory to good distance without much discomfort. He reports having occasional orthopnea and PND as well. He thinks he has gained about 30 pounds in 3 months. He denies any sick contacts, recent illness or recent travel. Denies any changes in his medications recently. He reports being compliant with his diet and medications. He is on CPAP for his OSA but has not been using it frequently as he does not have fitting mask.  Patient denies headache, dizziness, fever, chills, nausea , vomiting, chest pain, palpitations, abdominal pain,  bowel or urinary symptoms. Denies change in appetite.   Hospital Course:  Brief HPI: 63yo with PMH as below and with atrial fibrillation, NICM presented with worsening shortness of breath over last 3 weeks mainly with exertion and with orthopnea. He was noted to have pulmonary edema. He was admitted for CHF exacerbation.   1-Acute Diastolic CHF with Hypoxic respiratory failure  Presents with dyspnea. Orthopnea, mildly increase BNP. Chest x ray with pulmonary edema.  Repeat with improved EF. ECHO 2015: EF 55 to 60 %  ECHO 2008 ; 25-30% . Marland Kitchen Likely has hypertensive heart disease. He is on ACEI.  Weight: 134----132-------133---132---131  Change IV lasix 40 mg TID to oral 40 mg BID.   A. fib  Rate controlled on Cardizem. Continue Coumadin. Dosing per pharmacy   Diabetes mellitus type 2  Resume Janumet. A1c of 7.5. On SSI.  Obstructive sleep apnea  CPAP  Need Outpatient sleep study. CM has arrange outpatient sleep study.  Hypertension  Continue lisinopril, Cardizem.  Hold Diovan-HCTZ patient now on lasix.  History of gout  Continue allopurinol  Anemia  Appears to be chronic. Has history of colon cancer status post resection.    Procedures: ECHO; Left ventricle: The cavity size was normal. Wall thickness was increased in a pattern of mild LVH. Systolic function was normal. The estimated ejection fraction was in the range of 55% to 60%. Indeterminant diastolic function (atrial fibrillation). Wall motion was normal; there were no regional wall motion abnormalities. - Aortic valve: Trileaflet; moderately calcified leaflets. Sclerosis without stenosis. - Mitral  valve: Mildly to moderately calcified annulus. There was no significant regurgitation. - Left atrium: The atrium was mildly dilated. - Right ventricle: The cavity size was mildly dilated. Systolic function was normal. - Pulmonary arteries: No complete TR doppler jet so unable to estimate PA systolic  pressure.    Consultations:  None  Discharge Exam: Filed Vitals:   08/10/13 1036  BP: 110/60  Pulse:   Temp:   Resp:     General: No distress.  Cardiovascular: S 1, S 2 RRR Respiratory: CTA  Discharge Instructions You were cared for by a hospitalist during your hospital stay. If you have any questions about your discharge medications or the care you received while you were in the hospital after you are discharged, you can call the unit and asked to speak with the hospitalist on call if the hospitalist that took care of you is not available. Once you are discharged, your primary care physician will handle any further medical issues. Please note that NO REFILLS for any discharge medications will be authorized once you are discharged, as it is imperative that you return to your primary care physician (or establish a relationship with a primary care physician if you do not have one) for your aftercare needs so that they can reassess your need for medications and monitor your lab values.  Discharge Instructions   Diet - low sodium heart healthy    Complete by:  As directed      Increase activity slowly    Complete by:  As directed             Medication List    STOP taking these medications       valsartan-hydrochlorothiazide 320-25 MG per tablet  Commonly known as:  DIOVAN-HCT      TAKE these medications       allopurinol 100 MG tablet  Commonly known as:  ZYLOPRIM  Take 100 mg by mouth daily.     allopurinol 300 MG tablet  Commonly known as:  ZYLOPRIM  Take 300 mg by mouth daily. Takes along with 100 mg tablet to equal 400 mg     aspirin 81 MG tablet  Take 81 mg by mouth daily.     atorvastatin 20 MG tablet  Commonly known as:  LIPITOR  Take 20 mg by mouth daily.     azelastine 0.05 % ophthalmic solution  Commonly known as:  OPTIVAR  Place 2 drops into both eyes 2 (two) times daily.     diltiazem 240 MG 24 hr capsule  Commonly known as:  CARDIZEM CD  Take  240 mg by mouth daily.     furosemide 40 MG tablet  Commonly known as:  LASIX  Take 1 tablet (40 mg total) by mouth 2 (two) times daily.     glucose blood test strip  Use to check BG daily     lisinopril 20 MG tablet  Commonly known as:  PRINIVIL,ZESTRIL  Take 20 mg by mouth daily.     omeprazole 20 MG capsule  Commonly known as:  PRILOSEC  Take 1 capsule (20 mg total) by mouth daily.     ONETOUCH DELICA LANCETS 15A Misc  1 each by Does not apply route daily. Use one lancet to check BG daily     sitaGLIPtin-metformin 50-1000 MG per tablet  Commonly known as:  JANUMET  Take 1 tablet by mouth 2 (two) times daily with a meal.     sulfacetamide 10 % ophthalmic solution  Commonly  known as:  BLEPH-10  Place 1 drop into the left eye every 4 (four) hours.     warfarin 5 MG tablet  Commonly known as:  COUMADIN  Take 7.5-10 mg by mouth daily.       Allergies  Allergen Reactions  . Codeine     Headache        Follow-up Information   Follow up with [facility] On 09/05/2013. (Sleep Study appointment Thursday - September 05, 2013 - Center to mail out patient package for completion and instructions. )    Contact information:   [place], [place] [address] [phone]      Follow up with [facility]. (CPap provided by [facility] to be delivered to patients room prior to discharge)    Contact information:   [address] [place] [address] [phone]       Follow up with Lorretta Harp, MD In 1 week.   Specialty:  Cardiology   Contact information:   [address] [place] [address] [phone]        The results of significant diagnostics from this hospitalization (including imaging, microbiology, ancillary and laboratory) are listed below for reference.    Significant Diagnostic Studies: Dg Chest 2 View  08/05/2013   CLINICAL DATA:  Shortness of breath  EXAM: CHEST   2 VIEW  COMPARISON:  Radiographs were obtained today at 12:33pm at [facility]. These are available for comparison on [facility] PACS.  FINDINGS: New trace pleural effusions, interstitial edema, and central vascular congestion since previously. Moderate cardiomegaly is again noted.  IMPRESSION: New interstitial pulmonary edema and trace effusions with cardiomegaly again noted. Findings have developed since earlier today's comparison exam (please see prior on [facility] PACS from [place]).   Electronically Signed   By: Conchita Paris M.D.   On: 08/05/2013 15:34   Dg Chest 2 View  08/05/2013   CLINICAL DATA:  Shortness of breath and cough  EXAM: CHEST  2 VIEW  COMPARISON:  To eighteenth to facet 8  FINDINGS: The heart size is at the upper limits of normal. Both lungs are clear. The visualized skeletal structures are unremarkable.  IMPRESSION: No active cardiopulmonary disease.   Electronically Signed   By: Conchita Paris M.D.   On: 08/05/2013 15:14    Microbiology: No results found for this or any previous visit (from the past 240 hour(s)).   Labs: Basic Metabolic Panel:  Recent Labs Lab 08/06/13 0450 08/07/13 1227 08/08/13 0438 08/09/13 0450 08/10/13 0340  NA 141 140 143 142 139  K 4.4 4.9 5.1 4.8 4.5  CL 101 101 101 99 98  CO2 28 30 32 33* 32  GLUCOSE 206* 198* 146* 147* 185*  BUN 19 23 27* 28* 30*  CREATININE 1.09 1.28 1.34 1.31 1.10  CALCIUM 9.0 8.9 9.2 9.0 8.8   Liver Function Tests: No results found for this basename: AST, ALT, ALKPHOS, BILITOT, PROT, ALBUMIN,  in the last 168 hours No results found for this basename: LIPASE, AMYLASE,  in the last 168 hours No results found for this basename: AMMONIA,  in the last 168 hours CBC:  Recent Labs Lab 08/05/13 1403 08/07/13 1227  WBC 8.2 6.0  HGB 10.9* 10.9*  HCT 36.1* 38.2*  MCV 90.3 94.8  PLT 217 212   Cardiac Enzymes: No results found for this basename: CKTOTAL, CKMB,  CKMBINDEX, TROPONINI,  in the last 168 hours BNP: BNP (last 3 results)  Recent Labs  08/05/13 1403  PROBNP 376.6*   CBG:  Recent Labs Lab 08/09/13 0635 08/09/13 1131 08/09/13 1616 08/09/13 2107 08/10/13 0552  GLUCAP 146* 204* 146* 197* 165*       Signed:  Floretta Petro A  [facility] 08/10/2013, 11:07 AM

## 2013-08-10 NOTE — Progress Notes (Signed)
D/c instructions and  RX given to and d/w pt. Pt verbalizes understanding. D/c home

## 2013-08-10 NOTE — Progress Notes (Signed)
ANTICOAGULATION CONSULT NOTE - Follow-up Consult  Pharmacy Consult for coumadin Indication: atrial fibrillation  Allergies  Allergen Reactions  . Codeine     Headache     Patient Measurements: Height: 5' 4"  (162.6 cm) Weight: 288 lb 2.3 oz (130.7 kg) IBW/kg (Calculated) : 59.2   Vital Signs: Temp: 97.7 F (36.5 C) (08/08 0455) Temp src: Oral (08/08 0455) BP: 110/60 mmHg (08/08 1036) Pulse Rate: 84 (08/08 0455)  Labs:  Recent Labs  08/07/13 1227 08/08/13 0438 08/09/13 0450 08/10/13 0340  HGB 10.9*  --   --   --   HCT 38.2*  --   --   --   PLT 212  --   --   --   LABPROT  --  29.8* 25.7* 25.0*  INR  --  2.84* 2.35* 2.26*  CREATININE 1.28 1.34 1.31 1.10    Estimated Creatinine Clearance: 87.6 ml/min (by C-G formula based on Cr of 1.1).  Assessment: 63 yo man to continue coumadin for afib.  INR remains therapeutic at 2.26. Hg 10.9, PTLC 217. No bleeding noted. Home dose: 7.59m T/T/S/S and 179mM/W/F  Goal of Therapy:  INR 2-3 Monitor platelets by anticoagulation protocol: Yes   Plan:  -Cont home dose of Coumadin  -Since INR has been stable, switch to monitoring INR Q 72 hours   BeAlbertina ParrPharmD.  Clinical Pharmacist Pager [phone]

## 2013-08-15 ENCOUNTER — Other Ambulatory Visit: Payer: Self-pay | Admitting: Family Medicine

## 2013-08-19 ENCOUNTER — Ambulatory Visit (INDEPENDENT_AMBULATORY_CARE_PROVIDER_SITE_OTHER): Payer: BC Managed Care – PPO | Admitting: Family Medicine

## 2013-08-19 ENCOUNTER — Encounter: Payer: Self-pay | Admitting: Family Medicine

## 2013-08-19 VITALS — BP 101/59 | HR 72 | Temp 99.0°F | Ht 64.0 in | Wt 282.2 lb

## 2013-08-19 DIAGNOSIS — Z23 Encounter for immunization: Secondary | ICD-10-CM

## 2013-08-19 DIAGNOSIS — I4891 Unspecified atrial fibrillation: Secondary | ICD-10-CM

## 2013-08-19 DIAGNOSIS — I482 Chronic atrial fibrillation, unspecified: Secondary | ICD-10-CM

## 2013-08-19 DIAGNOSIS — E119 Type 2 diabetes mellitus without complications: Secondary | ICD-10-CM

## 2013-08-19 LAB — POCT GLYCOSYLATED HEMOGLOBIN (HGB A1C): Hemoglobin A1C: 7.8

## 2013-08-19 LAB — POCT INR: INR: 3.5

## 2013-08-19 NOTE — Patient Instructions (Signed)
Anticoagulation Dose Instructions as of 08/19/2013     Dorene Grebe Tue Wed Thu Fri Sat   New Dose 7.5 mg 10 mg 7.5 mg 10 mg 7.5 mg 10 mg 7.5 mg    Description       No warfarin today - Monday, August 17th then continue 28m Mondays, Wednesdays and Fridays and 7.551mall other days       INR was 3.5 today

## 2013-08-19 NOTE — Progress Notes (Signed)
   Subjective:    Patient ID: William Flores, male    DOB: March 28, 1950, 63 y.o.   MRN: [id number]  HPI  63 year old gentleman who is here today to followup hospitalization, diabetes, hypertension, congestive heart failure.  He stayed code hospital for one week for issues related to congestive heart failure and shortness of breath. He was discharged home on Lasix as a new medicine. He was instructed to weigh himself and call for changes of weight greater than 3 pounds from previous days weight. He has been taking Lasix 40 mg in the morning and 40 mg in the evening. He has returned to work and is generally doing much better.     Review of Systems  Constitutional: Negative.   HENT: Negative.   Respiratory: Negative.   Cardiovascular: Negative.   Genitourinary: Negative.   Neurological: Negative.   Psychiatric/Behavioral: Negative.        Objective:   Physical Exam  Constitutional: He is oriented to person, place, and time. He appears well-developed and well-nourished.  HENT:  Head: Normocephalic.  Right Ear: External ear normal.  Left Ear: External ear normal.  Nose: Nose normal.  Mouth/Throat: Oropharynx is clear and moist.  Eyes: Conjunctivae and EOM are normal. Pupils are equal, round, and reactive to light.  Neck: Normal range of motion. Neck supple.  Cardiovascular: Normal rate, regular rhythm, normal heart sounds and intact distal pulses.   Pulmonary/Chest: Effort normal and breath sounds normal.  Abdominal: Soft. Bowel sounds are normal.  Musculoskeletal: Normal range of motion.  Neurological: He is alert and oriented to person, place, and time.  Skin: Skin is warm and dry.  Psychiatric: He has a normal mood and affect. His behavior is normal. Judgment and thought content normal.   BP 101/59  Pulse 72  Temp(Src) 99 F (37.2 C) (Oral)  Ht 5' 4"  (1.626 m)  Wt 282 lb 3.2 oz (128.005 kg)  BMI 48.42 kg/m2       Assessment & Plan:  1. Need for  diphtheria-tetanus-pertussis (Tdap) vaccine, adult/adolescent Done - Tdap vaccine greater than or equal to 7yo IM  2. Chronic atrial fibrillation Continues on Coumadin.  Rate controlled on diltiazem  3. Type 2 diabetes mellitus without complication Checking glucose at home; results mostly good - POCT glycosylated hemoglobin (Hb A1C)  4. A-fib See above - POCT INR  Wardell Honour MD

## 2013-08-21 ENCOUNTER — Telehealth: Payer: Self-pay | Admitting: Family Medicine

## 2013-08-21 NOTE — Telephone Encounter (Signed)
Message copied by Waverly Ferrari on Wed Aug 21, 2013  9:34 AM ------      Message from: Wardell Honour      Created: Wed Aug 21, 2013  7:59 AM       Hemoglobin A1c is higher than it has been in recent testing. This was suggests he needs to pay more attention to his diet and weight. ------

## 2013-08-29 ENCOUNTER — Telehealth: Payer: Self-pay | Admitting: Family Medicine

## 2013-08-29 ENCOUNTER — Other Ambulatory Visit: Payer: Self-pay

## 2013-08-29 DIAGNOSIS — G4733 Obstructive sleep apnea (adult) (pediatric): Secondary | ICD-10-CM

## 2013-08-29 NOTE — Telephone Encounter (Signed)
Message taken for referrals to call sleep center.  Give to [employer]

## 2013-09-03 ENCOUNTER — Telehealth: Payer: Self-pay | Admitting: Family Medicine

## 2013-09-03 NOTE — Telephone Encounter (Signed)
Pt aware of lab results 

## 2013-09-03 NOTE — Telephone Encounter (Signed)
Message copied by Cline Crock on Tue Sep 03, 2013 12:47 PM ------      Message from: Wardell Honour      Created: Wed Aug 21, 2013  7:59 AM       Hemoglobin A1c is higher than it has been in recent testing. This was suggests he needs to pay more attention to his diet and weight. ------

## 2013-09-05 ENCOUNTER — Encounter (HOSPITAL_BASED_OUTPATIENT_CLINIC_OR_DEPARTMENT_OTHER): Payer: BC Managed Care – PPO

## 2013-09-05 ENCOUNTER — Ambulatory Visit (INDEPENDENT_AMBULATORY_CARE_PROVIDER_SITE_OTHER): Payer: BC Managed Care – PPO | Admitting: Pharmacist

## 2013-09-05 DIAGNOSIS — I4891 Unspecified atrial fibrillation: Secondary | ICD-10-CM

## 2013-09-05 DIAGNOSIS — E1159 Type 2 diabetes mellitus with other circulatory complications: Secondary | ICD-10-CM

## 2013-09-05 LAB — POCT INR: INR: 3.4

## 2013-09-05 NOTE — Patient Instructions (Signed)
Anticoagulation Dose Instructions as of 09/05/2013     William Flores Tue Wed Thu Fri Sat   New Dose 7.5 mg 10 mg 7.5 mg 7.5 mg 7.5 mg 10 mg 7.5 mg    Description       No warfarin today - Thursday, September 3rd then continue 57m Mondays and Fridays and 7.546mall other days      INR was 3.4 today

## 2013-09-05 NOTE — Progress Notes (Signed)
Patient states that he Janumet was about $100 dollars.  I checked his insurance formulary and all similar medications would be same price.  I gave him a card from manufacturer that will cover up to $150 of the mediation cost.  He is to let me know if for some reason this doesn't lower cost.

## 2013-10-07 ENCOUNTER — Ambulatory Visit (INDEPENDENT_AMBULATORY_CARE_PROVIDER_SITE_OTHER): Payer: BC Managed Care – PPO | Admitting: Pharmacist

## 2013-10-07 DIAGNOSIS — Z23 Encounter for immunization: Secondary | ICD-10-CM

## 2013-10-07 DIAGNOSIS — I4891 Unspecified atrial fibrillation: Secondary | ICD-10-CM

## 2013-10-07 DIAGNOSIS — E119 Type 2 diabetes mellitus without complications: Secondary | ICD-10-CM

## 2013-10-07 LAB — POCT INR: INR: 1.8

## 2013-10-07 LAB — GLUCOSE, POCT (MANUAL RESULT ENTRY): POC Glucose: 129 mg/dl — AB (ref 70–99)

## 2013-10-07 MED ORDER — GLUCOSE BLOOD VI STRP: ORAL_STRIP | Status: DC

## 2013-10-07 NOTE — Patient Instructions (Signed)
  Blood Glucose Goals Prior to meals = 80 - 130 Within 2 hours of the start of a meal = less than 180  Anticoagulation Dose Instructions as of 10/07/2013     Dorene Grebe Tue Wed Thu Fri Sat   New Dose 7.5 mg 10 mg 7.5 mg 7.5 mg 7.5 mg 10 mg 7.5 mg    Description       Take two (2) tablets today, Monday, October 5th and Tuesday, October 6th, then continue 2m Mondays and Fridays and 7.514mall other days     INR was 1.8 today

## 2013-10-09 ENCOUNTER — Other Ambulatory Visit: Payer: Self-pay | Admitting: Nurse Practitioner

## 2013-10-10 ENCOUNTER — Other Ambulatory Visit: Payer: Self-pay | Admitting: *Deleted

## 2013-10-15 ENCOUNTER — Other Ambulatory Visit: Payer: Self-pay | Admitting: Family Medicine

## 2013-10-15 ENCOUNTER — Telehealth: Payer: Self-pay | Admitting: Family Medicine

## 2013-10-15 MED ORDER — WARFARIN SODIUM 5 MG PO TABS: ORAL_TABLET | ORAL | Status: DC

## 2013-10-15 NOTE — Telephone Encounter (Signed)
done

## 2013-10-21 ENCOUNTER — Other Ambulatory Visit: Payer: Self-pay | Admitting: Family Medicine

## 2013-10-21 ENCOUNTER — Telehealth: Payer: Self-pay | Admitting: Family Medicine

## 2013-10-21 MED ORDER — BENZONATATE 100 MG PO CAPS: 100.0000 mg | ORAL_CAPSULE | Freq: Two times a day (BID) | ORAL | Status: DC | PRN

## 2013-10-21 NOTE — Telephone Encounter (Signed)
Tessalon perles rx sent to pharmacy

## 2013-10-28 ENCOUNTER — Ambulatory Visit (INDEPENDENT_AMBULATORY_CARE_PROVIDER_SITE_OTHER): Payer: BC Managed Care – PPO | Admitting: Family Medicine

## 2013-10-28 ENCOUNTER — Encounter: Payer: Self-pay | Admitting: Family Medicine

## 2013-10-28 ENCOUNTER — Encounter (HOSPITAL_COMMUNITY): Payer: Self-pay | Admitting: Emergency Medicine

## 2013-10-28 ENCOUNTER — Inpatient Hospital Stay (HOSPITAL_COMMUNITY)
Admission: EM | Admit: 2013-10-28 | Discharge: 2013-11-05 | DRG: 291 | Disposition: A | Discharge status: Home / Self Care | Payer: BC Managed Care – PPO | Attending: Internal Medicine | Admitting: Internal Medicine

## 2013-10-28 ENCOUNTER — Ambulatory Visit (INDEPENDENT_AMBULATORY_CARE_PROVIDER_SITE_OTHER): Payer: BC Managed Care – PPO

## 2013-10-28 VITALS — BP 136/79 | HR 128 | Temp 98.4°F | Ht 64.0 in | Wt 301.8 lb

## 2013-10-28 DIAGNOSIS — R0602 Shortness of breath: Secondary | ICD-10-CM

## 2013-10-28 DIAGNOSIS — N183 Chronic kidney disease, stage 3 (moderate): Secondary | ICD-10-CM | POA: Diagnosis present

## 2013-10-28 DIAGNOSIS — Z9114 Patient's other noncompliance with medication regimen: Secondary | ICD-10-CM

## 2013-10-28 DIAGNOSIS — J209 Acute bronchitis, unspecified: Secondary | ICD-10-CM | POA: Diagnosis present

## 2013-10-28 DIAGNOSIS — I509 Heart failure, unspecified: Secondary | ICD-10-CM

## 2013-10-28 DIAGNOSIS — R1900 Intra-abdominal and pelvic swelling, mass and lump, unspecified site: Secondary | ICD-10-CM

## 2013-10-28 DIAGNOSIS — I5031 Acute diastolic (congestive) heart failure: Secondary | ICD-10-CM | POA: Diagnosis present

## 2013-10-28 DIAGNOSIS — I482 Chronic atrial fibrillation, unspecified: Secondary | ICD-10-CM

## 2013-10-28 DIAGNOSIS — D638 Anemia in other chronic diseases classified elsewhere: Secondary | ICD-10-CM | POA: Diagnosis present

## 2013-10-28 DIAGNOSIS — Z79899 Other long term (current) drug therapy: Secondary | ICD-10-CM

## 2013-10-28 DIAGNOSIS — IMO0002 Reserved for concepts with insufficient information to code with codable children: Secondary | ICD-10-CM

## 2013-10-28 DIAGNOSIS — Z7982 Long term (current) use of aspirin: Secondary | ICD-10-CM

## 2013-10-28 DIAGNOSIS — I481 Persistent atrial fibrillation: Secondary | ICD-10-CM | POA: Diagnosis present

## 2013-10-28 DIAGNOSIS — Z7901 Long term (current) use of anticoagulants: Secondary | ICD-10-CM

## 2013-10-28 DIAGNOSIS — Z6841 Body Mass Index (BMI) 40.0 and over, adult: Secondary | ICD-10-CM

## 2013-10-28 DIAGNOSIS — I119 Hypertensive heart disease without heart failure: Secondary | ICD-10-CM | POA: Diagnosis present

## 2013-10-28 DIAGNOSIS — I4891 Unspecified atrial fibrillation: Secondary | ICD-10-CM | POA: Diagnosis present

## 2013-10-28 DIAGNOSIS — I10 Essential (primary) hypertension: Secondary | ICD-10-CM | POA: Insufficient documentation

## 2013-10-28 DIAGNOSIS — I5033 Acute on chronic diastolic (congestive) heart failure: Secondary | ICD-10-CM | POA: Diagnosis not present

## 2013-10-28 DIAGNOSIS — Z87891 Personal history of nicotine dependence: Secondary | ICD-10-CM

## 2013-10-28 DIAGNOSIS — E1129 Type 2 diabetes mellitus with other diabetic kidney complication: Secondary | ICD-10-CM

## 2013-10-28 DIAGNOSIS — I5023 Acute on chronic systolic (congestive) heart failure: Secondary | ICD-10-CM | POA: Insufficient documentation

## 2013-10-28 DIAGNOSIS — G4733 Obstructive sleep apnea (adult) (pediatric): Secondary | ICD-10-CM

## 2013-10-28 DIAGNOSIS — I4819 Other persistent atrial fibrillation: Secondary | ICD-10-CM | POA: Insufficient documentation

## 2013-10-28 DIAGNOSIS — J9601 Acute respiratory failure with hypoxia: Secondary | ICD-10-CM | POA: Diagnosis present

## 2013-10-28 DIAGNOSIS — Z9989 Dependence on other enabling machines and devices: Secondary | ICD-10-CM

## 2013-10-28 DIAGNOSIS — I5032 Chronic diastolic (congestive) heart failure: Secondary | ICD-10-CM

## 2013-10-28 DIAGNOSIS — I129 Hypertensive chronic kidney disease with stage 1 through stage 4 chronic kidney disease, or unspecified chronic kidney disease: Secondary | ICD-10-CM | POA: Diagnosis present

## 2013-10-28 DIAGNOSIS — E1165 Type 2 diabetes mellitus with hyperglycemia: Secondary | ICD-10-CM | POA: Diagnosis present

## 2013-10-28 DIAGNOSIS — E785 Hyperlipidemia, unspecified: Secondary | ICD-10-CM | POA: Diagnosis present

## 2013-10-28 LAB — COMPREHENSIVE METABOLIC PANEL
ALBUMIN: 3.3 g/dL — AB (ref 3.5–5.2)
ALK PHOS: 69 U/L (ref 39–117)
ALT: 21 U/L (ref 0–53)
AST: 23 U/L (ref 0–37)
Anion gap: 11 (ref 5–15)
BILIRUBIN TOTAL: 0.4 mg/dL (ref 0.3–1.2)
BUN: 21 mg/dL (ref 6–23)
CHLORIDE: 104 meq/L (ref 96–112)
CO2: 29 mEq/L (ref 19–32)
Calcium: 9.1 mg/dL (ref 8.4–10.5)
Creatinine, Ser: 1.09 mg/dL (ref 0.50–1.35)
GFR calc Af Amer: 83 mL/min — ABNORMAL LOW (ref >90)
GFR calc non Af Amer: 71 mL/min — ABNORMAL LOW (ref >90)
Glucose, Bld: 124 mg/dL — ABNORMAL HIGH (ref 70–99)
Potassium: 4.3 mEq/L (ref 3.7–5.3)
SODIUM: 144 meq/L (ref 137–147)
Total Protein: 6.8 g/dL (ref 6.0–8.3)

## 2013-10-28 LAB — CBC WITH DIFFERENTIAL/PLATELET
BASOS ABS: 0 10*3/uL (ref 0.0–0.1)
Basophils Relative: 1 % (ref 0–1)
Eosinophils Absolute: 0.1 10*3/uL (ref 0.0–0.7)
Eosinophils Relative: 1 % (ref 0–5)
HCT: 35.1 % — ABNORMAL LOW (ref 39.0–52.0)
HEMOGLOBIN: 10.2 g/dL — AB (ref 13.0–17.0)
Lymphocytes Relative: 10 % — ABNORMAL LOW (ref 12–46)
Lymphs Abs: 0.7 10*3/uL (ref 0.7–4.0)
MCH: 24.4 pg — ABNORMAL LOW (ref 26.0–34.0)
MCHC: 29.1 g/dL — ABNORMAL LOW (ref 30.0–36.0)
MCV: 84 fL (ref 78.0–100.0)
MONOS PCT: 9 % (ref 3–12)
Monocytes Absolute: 0.6 10*3/uL (ref 0.1–1.0)
NEUTROS PCT: 79 % — AB (ref 43–77)
Neutro Abs: 5.6 10*3/uL (ref 1.7–7.7)
Platelets: 228 10*3/uL (ref 150–400)
RBC: 4.18 MIL/uL — ABNORMAL LOW (ref 4.22–5.81)
RDW: 21 % — ABNORMAL HIGH (ref 11.5–15.5)
WBC: 7.1 10*3/uL (ref 4.0–10.5)

## 2013-10-28 LAB — TROPONIN I: Troponin I: <0.3 ng/mL (ref <0.30)

## 2013-10-28 LAB — PROTIME-INR
INR: 2.29 — ABNORMAL HIGH (ref 0.00–1.49)
Prothrombin Time: 25.4 seconds — ABNORMAL HIGH (ref 11.6–15.2)

## 2013-10-28 LAB — GLUCOSE, CAPILLARY
GLUCOSE-CAPILLARY: 208 mg/dL — AB (ref 70–99)
Glucose-Capillary: 173 mg/dL — ABNORMAL HIGH (ref 70–99)

## 2013-10-28 LAB — PRO B NATRIURETIC PEPTIDE: PRO B NATRI PEPTIDE: 325.7 pg/mL — AB (ref 0–125)

## 2013-10-28 MED ORDER — WARFARIN - PHARMACIST DOSING INPATIENT
Freq: Every day | Status: DC
  Administered 2013-11-02 – 2013-11-03 (×2): 1

## 2013-10-28 MED ORDER — FUROSEMIDE 10 MG/ML IJ SOLN
40.0000 mg | Freq: Once | INTRAMUSCULAR | Status: AC
  Administered 2013-10-28: 40 mg via INTRAVENOUS
  Filled 2013-10-28: qty 4

## 2013-10-28 MED ORDER — PANTOPRAZOLE SODIUM 40 MG PO TBEC
40.0000 mg | DELAYED_RELEASE_TABLET | Freq: Every day | ORAL | Status: DC
  Administered 2013-10-28 – 2013-11-05 (×9): 40 mg via ORAL
  Filled 2013-10-28 (×9): qty 1

## 2013-10-28 MED ORDER — WARFARIN SODIUM 10 MG PO TABS
10.0000 mg | ORAL_TABLET | ORAL | Status: DC
  Administered 2013-10-28: 10 mg via ORAL
  Filled 2013-10-28 (×2): qty 1

## 2013-10-28 MED ORDER — SODIUM CHLORIDE 0.9 % IJ SOLN
3.0000 mL | Freq: Two times a day (BID) | INTRAMUSCULAR | Status: DC
  Administered 2013-10-28 – 2013-11-05 (×8): 3 mL via INTRAVENOUS

## 2013-10-28 MED ORDER — BENZONATATE 100 MG PO CAPS
100.0000 mg | ORAL_CAPSULE | Freq: Two times a day (BID) | ORAL | Status: DC | PRN
  Administered 2013-10-31 – 2013-11-04 (×3): 100 mg via ORAL
  Filled 2013-10-28 (×7): qty 1

## 2013-10-28 MED ORDER — SULFACETAMIDE SODIUM 10 % OP SOLN: 1.0000 [drp] | OPHTHALMIC | Status: DC

## 2013-10-28 MED ORDER — DILTIAZEM HCL ER COATED BEADS 240 MG PO CP24
240.0000 mg | ORAL_CAPSULE | Freq: Every day | ORAL | Status: DC
  Administered 2013-10-29 – 2013-10-31 (×3): 240 mg via ORAL
  Filled 2013-10-28 (×3): qty 1

## 2013-10-28 MED ORDER — WARFARIN SODIUM 7.5 MG PO TABS
7.5000 mg | ORAL_TABLET | ORAL | Status: DC
  Filled 2013-10-28: qty 1

## 2013-10-28 MED ORDER — ALLOPURINOL 300 MG PO TABS
300.0000 mg | ORAL_TABLET | Freq: Every day | ORAL | Status: DC
  Administered 2013-10-29 – 2013-11-05 (×8): 300 mg via ORAL
  Filled 2013-10-28 (×8): qty 1

## 2013-10-28 MED ORDER — KETOTIFEN FUMARATE 0.025 % OP SOLN
1.0000 [drp] | Freq: Two times a day (BID) | OPHTHALMIC | Status: DC
  Administered 2013-10-29 – 2013-11-04 (×4): 1 [drp] via OPHTHALMIC
  Filled 2013-10-28 (×2): qty 5

## 2013-10-28 MED ORDER — ATORVASTATIN CALCIUM 20 MG PO TABS
20.0000 mg | ORAL_TABLET | Freq: Every day | ORAL | Status: DC
  Administered 2013-10-29 – 2013-11-05 (×8): 20 mg via ORAL
  Filled 2013-10-28 (×8): qty 1

## 2013-10-28 MED ORDER — SODIUM CHLORIDE 0.9 % IJ SOLN
3.0000 mL | Freq: Two times a day (BID) | INTRAMUSCULAR | Status: DC
  Administered 2013-10-28 – 2013-11-05 (×14): 3 mL via INTRAVENOUS

## 2013-10-28 MED ORDER — SODIUM CHLORIDE 0.9 % IJ SOLN: 3.0000 mL | INTRAMUSCULAR | Status: DC | PRN

## 2013-10-28 MED ORDER — SODIUM CHLORIDE 0.9 % IV SOLN: 250.0000 mL | INTRAVENOUS | Status: DC | PRN

## 2013-10-28 MED ORDER — INSULIN ASPART 100 UNIT/ML ~~LOC~~ SOLN
0.0000 [IU] | SUBCUTANEOUS | Status: DC
  Administered 2013-10-28: 3 [IU] via SUBCUTANEOUS
  Administered 2013-10-29 (×3): 2 [IU] via SUBCUTANEOUS

## 2013-10-28 MED ORDER — LISINOPRIL 20 MG PO TABS
20.0000 mg | ORAL_TABLET | Freq: Every day | ORAL | Status: DC
  Administered 2013-10-28 – 2013-11-05 (×9): 20 mg via ORAL
  Filled 2013-10-28 (×9): qty 1

## 2013-10-28 MED ORDER — FUROSEMIDE 10 MG/ML IJ SOLN
40.0000 mg | Freq: Two times a day (BID) | INTRAMUSCULAR | Status: DC
  Administered 2013-10-29 – 2013-10-31 (×5): 40 mg via INTRAVENOUS
  Filled 2013-10-28 (×9): qty 4

## 2013-10-28 NOTE — ED Notes (Signed)
Lattie Haw, [place] C allows patient to eat at this time.  Informed patient that staff will be measuring output now that he has received lasix. Provided urinal at the bedside.

## 2013-10-28 NOTE — Progress Notes (Signed)
   Subjective:    Patient ID: William Flores, male    DOB: Feb 28, 1950, 63 y.o.   MRN: [id number]  HPI This 63 y.o. male presents for evaluation of uri sx's and cough.  He has been having SOB and abdominal distention.  He is having DOE and denies PND or chest pain.   Review of Systemsc/o SOB No chest pain, HA, dizziss, vision change, N/V, diarrhea, constipation, dysuria, urinary urgency or frequency, myalgias, arthralgias or rash.     Objective:   Physical Exam Vital signs noted  Well developed well nourished male.  HEENT - Head atraumatic Normocephalic                Throat - oropharanx wnl                 Neck - JVD bilateral Respiratory - Lungs diminished throughout Cardiac - RRR S1 and S2 without murmur GI - Abdomen obese  soft distended and BS active x 4.  KUB - No free air CXR - CHF and cardiomegaly Prelimnary reading by Iverson Alamin     Assessment & Plan:  Shortness of breath - Plan: DG Chest 2 View  Abdominal swelling - Plan: DG Abd 1 View  CHF - Discussed with patient he should be seen in ED for this and recommend transfer via EMS and he agrees.  Lysbeth Penner FNP

## 2013-10-28 NOTE — ED Provider Notes (Signed)
CSN: [id number]     Arrival date & time 10/28/13  1730 History   First MD Initiated Contact with Patient 10/28/13 1735     Chief Complaint  Patient presents with  . Congestive Heart Failure     (Consider location/radiation/quality/duration/timing/severity/associated sxs/prior Treatment) The history is provided by the patient and medical records.   This is a 63 year old male with past medical history significant for hypertension, diabetes, hyperlipidemia, atrial fibrillation on Coumadin, presenting to the ED for cough and shortness of breath.  Patient sx have been ongoing for the past week.  States cough is productive with yellow sputum.  No fever, sweats, or chills.  States he feels that the cough is making him SOB.  Denies increased orthopnea or significant weight gain.  Patient was seen at [facility] urgent care earlier today, had CXR performed which showed pulmonary vascular congestion and send here for further evaluation.  Patient takes daily lasix, 21m BID (states was previsouly but was decreased, unsure why).  Patient also has hx of AFIB, takes cardizem.  Cardiologist is Dr. BGwenlyn Found  Last 2D echo on 08/07/13 with estimated 55-60% EF.  Denies current chest pain, palpitations, dizziness, weakness.  Patient is not on home O2.  He does require CPAP at night, but states he has been unable to use this recently because home health has not delivered his face mask.    Past Medical History  Diagnosis Date  . Diabetes   . Colon cancer   . Hypertension   . Sleep apnea   . Atrial fibrillation   . Hyperlipidemia    Past Surgical History  Procedure Laterality Date  . Colon resection    . Circumcision    . Colonoscopy N/A 09/21/2012    Procedure: COLONOSCOPY;  Surgeon: RInda Castle MD;  Location: [facility];  Service: Endoscopy;  Laterality: N/A;   Family History  Problem Relation Age of Onset  . Breast cancer Mother   . Diabetes Mother    History  Substance Use Topics  . Smoking  status: Former Smoker    Quit date: 08/06/1983  . Smokeless tobacco: Never Used  . Alcohol Use: No    Review of Systems  Respiratory: Positive for cough and shortness of breath.   All other systems reviewed and are negative.     Allergies  Codeine  Home Medications   Prior to Admission medications   Medication Sig Start Date End Date Taking? Authorizing Provider  allopurinol (ZYLOPRIM) 300 MG tablet TAKE 1 TABLET (300 MG TOTAL) BY MOUTH DAILY. ALSO TAKE 100MG DAILY TO EQUAL 400MG TOTAL. 10/11/13   SWardell Honour MD  aspirin 81 MG tablet Take 81 mg by mouth daily.    Historical Provider, MD  atorvastatin (LIPITOR) 20 MG tablet Take 20 mg by mouth daily.    Historical Provider, MD  azelastine (OPTIVAR) 0.05 % ophthalmic solution Place 2 drops into both eyes 2 (two) times daily. 04/19/13   FVernie Shanks MD  benzonatate (TESSALON) 100 MG capsule Take 1 capsule (100 mg total) by mouth 2 (two) times daily as needed for cough. 10/21/13   Mary-Margaret MHassell Done FNP  diltiazem (CARDIZEM CD) 240 MG 24 hr capsule Take 240 mg by mouth daily.    Historical Provider, MD  furosemide (LASIX) 40 MG tablet TAKE 1 TABLET TWICE A DAY 10/22/13   SWardell Honour MD  glucose blood test strip Use to check BG daily 10/07/13   Tammy Eckard, PHARMD  lisinopril (PRINIVIL,ZESTRIL) 20 MG tablet Take 20  mg by mouth daily.    Historical Provider, MD  omeprazole (PRILOSEC) 20 MG capsule Take 1 capsule (20 mg total) by mouth daily. 08/05/13   Lysbeth Penner, FNP  [facility] DELICA LANCETS 35D MISC 1 each by Does not apply route daily. Use one lancet to check BG daily 09/13/12   Tammy Eckard, PHARMD  sitaGLIPtin-metformin (JANUMET) 50-1000 MG per tablet Take 1 tablet by mouth 2 (two) times daily with a meal. 08/05/13   Lysbeth Penner, FNP  sulfacetamide (BLEPH-10) 10 % ophthalmic solution Place 1 drop into the left eye every 4 (four) hours. 04/19/13   Vernie Shanks, MD  warfarin (COUMADIN) 5 MG tablet TAKE 1 TO 2  TABLETS DAILY AS DIRECTED BY ANTICOAGULATION CLINIC 10/15/13   Chipper Herb, MD   BP 113/71  Pulse 60  Temp(Src) 98.1 F (36.7 C) (Oral)  Resp 26  Ht 5' 4"  (1.626 m)  Wt 295 lb (133.811 kg)  BMI 50.61 kg/m2  SpO2 96%  Physical Exam  Nursing note and vitals reviewed. Constitutional: He is oriented to person, place, and time. He appears well-developed and well-nourished. No distress.  Obese, NAD  HENT:  Head: Normocephalic and atraumatic.  Mouth/Throat: Oropharynx is clear and moist.  Eyes: Conjunctivae and EOM are normal. Pupils are equal, round, and reactive to light.  Neck: Normal range of motion. Neck supple.  Cardiovascular: Normal rate, normal heart sounds, intact distal pulses and normal pulses.  A regularly irregular rhythm present.  AFIB, rate controlled  Pulmonary/Chest: Effort normal. No respiratory distress. He has wheezes. He has rales.  Respirations unlabored; rales noted as well as scattered wheezes  Abdominal: Soft. Bowel sounds are normal. There is no tenderness. There is no guarding.  Musculoskeletal: Normal range of motion. He exhibits edema (trace).  Neurological: He is alert and oriented to person, place, and time.  Skin: Skin is warm and dry. He is not diaphoretic.  Psychiatric: He has a normal mood and affect.    ED Course  Procedures (including critical care time) Labs Review Labs Reviewed  CBC WITH DIFFERENTIAL - Abnormal; Notable for the following:    RBC 4.18 (*)    Hemoglobin 10.2 (*)    HCT 35.1 (*)    MCH 24.4 (*)    MCHC 29.1 (*)    RDW 21.0 (*)    Neutrophils Relative % 79 (*)    Lymphocytes Relative 10 (*)    All other components within normal limits  COMPREHENSIVE METABOLIC PANEL - Abnormal; Notable for the following:    Glucose, Bld 124 (*)    Albumin 3.3 (*)    GFR calc non Af Amer 71 (*)    GFR calc Af Amer 83 (*)    All other components within normal limits  PROTIME-INR - Abnormal; Notable for the following:    Prothrombin  Time 25.4 (*)    INR 2.29 (*)    All other components within normal limits  PRO B NATRIURETIC PEPTIDE - Abnormal; Notable for the following:    Pro B Natriuretic peptide (BNP) 325.7 (*)    All other components within normal limits  TROPONIN I    Imaging Review Dg Chest 2 View  10/28/2013   CLINICAL DATA:  Shortness of breath, personal history of diabetes, hypertension, atrial fibrillation, carcinoma of the colon  EXAM: CHEST  2 VIEW  COMPARISON:  08/05/2013  FINDINGS: Enlargement of cardiac silhouette with pulmonary vascular congestion.  Mediastinal contours normal.  Minimal chronic peribronchial thickening.  Extra  pleural density at the lateral aspects of the hemithoraces bilaterally appears stable question fat.  No acute infiltrate, pleural effusion or pneumothorax.  No acute osseous findings.  IMPRESSION: Enlargement of cardiac silhouette with pulmonary vascular congestion.  Mild chronic bronchitic changes.  No acute abnormalities.   Electronically Signed   By: Lavonia Dana M.D.   On: 10/28/2013 17:06     EKG Interpretation None      MDM   Final diagnoses:  SOB (shortness of breath)  Acute exacerbation of CHF (congestive heart failure)   63 year old male presenting with likely CHF exacerbation. On exam, no distress noted but is currently requiring supplemental oxygen.  EKG A. fib, rate controlled-- patient has history of same. Chest x-ray performed earlier today urgent care revealing pulmonary vascular congestion. Will obtain lab work.  Will likely need admission.  7:26 PM Lab work as above.  Troponin negative. INR is therapeutic. BNP 325.7.  I personally went in to reevaluate patient and personally turned off his supplemental oxygen. In less than 2 minutes he desaturated to 89% while lying still.  O2 quickly corrected back to 95% upon replacing O2. Patient will need hospital admission given new oxygen requirement.  Dose of IV lasix given.  Case discussed with hospitalist, Dr. Ernestina Patches  who will admit for further management.  Larene Pickett, PA-C 10/28/13 2050

## 2013-10-28 NOTE — ED Notes (Signed)
Dr. Ernestina Patches, admitting MD, at the bedside.

## 2013-10-28 NOTE — Progress Notes (Signed)
ANTICOAGULATION CONSULT NOTE - Initial Consult  Pharmacy Consult for warfarin Indication: atrial fibrilation  Allergies  Allergen Reactions  . Codeine     Headache     Patient Measurements: Height: 5' 4"  (162.6 cm) Weight: 295 lb (133.811 kg) IBW/kg (Calculated) : 59.2  Vital Signs: Temp: 98.1 F (36.7 C) (10/26 1740) Temp Source: Oral (10/26 1740) BP: 105/58 mmHg (10/26 1945) Pulse Rate: 87 (10/26 1945)  Labs:  Recent Labs  10/28/13 1746  HGB 10.2*  HCT 35.1*  PLT 228  LABPROT 25.4*  INR 2.29*  CREATININE 1.09  TROPONINI <0.30    Estimated Creatinine Clearance: 89.6 ml/min (by C-G formula based on Cr of 1.09).   Medical History: Past Medical History  Diagnosis Date  . Diabetes   . Colon cancer   . Hypertension   . Sleep apnea   . Atrial fibrillation   . Hyperlipidemia     Medications:  PTA warfarin 7.5 mg 5 days a week, 10 mg on MF.  Assessment: 63 y/o M w/ SOB, and cough. HX of CHF and a fib treated with warfarin. INR today 2.29. Plts WNL, Hgb 10.2.  Last dose of warfarin 7.5 mg on 10/25.  CHADSVASC score of 3. Continue warfarin for a fib.   Goal of Therapy:  Goal INR 2-3 Monitor platelets by anticoagulation protocol: yes   Plan:  Warfarin 10 mg x 1, continue home warfarin dose F/u INR, CBC  Carl Best 10/28/2013,8:04 PM

## 2013-10-28 NOTE — ED Notes (Signed)
Per ems-- pt coming from pcp with c/o dry cough x 1 week. Hx chf and a-fib. Pt takes Cardizem and lasix. Sent here for further evaluation of ch exacerbation. Exp wheezes noted.

## 2013-10-28 NOTE — ED Provider Notes (Signed)
Medical screening examination/treatment/procedure(s) were performed by non-physician practitioner and as supervising physician I was immediately available for consultation/collaboration.   EKG Interpretation None       Charlesetta Shanks, MD 10/28/13 2348

## 2013-10-28 NOTE — ED Notes (Signed)
Patient ambulated to restroom with assist, then transferring upstairs to new unit in stretcher with EMT

## 2013-10-28 NOTE — Progress Notes (Signed)
Agree with the above assessment. Will resume patient's home Coumadin dose.   Albertina Parr, PharmD.  Clinical Pharmacist Pager [phone]

## 2013-10-28 NOTE — ED Notes (Signed)
Called in heart healthy tray for the patient.

## 2013-10-28 NOTE — H&P (Addendum)
Hospitalist Admission History and Physical  Patient name: William Flores Medical record number: [id number] Date of birth: 1950/02/08 Age: 63 y.o. Gender: male  Primary Care Provider: Redge Gainer, MD  Chief Complaint: acute resp failure with hypoxia, Acute on chronic diastolic heart failure  History of Present Illness:This is a 63 y.o. year old male with significant past medical history of hypertension, atrial fibrillation on Coumadin, chronic diastolic heart failure, type 2 diabetes presenting with acute respiratory failure with hypoxia, acute on chronic diastolic heart failure. Patient reports 3-4 week history of increased cough as well as upper respiratory symptoms including rhinorrhea nasal congestion. Denies any orthopnea or worsening PND. States that he has chronic lower extremity swelling that recurs on a daily basis. Denies any increased salt intake. Was admitted back in August for similar symptoms. Does report an approximate 10 pound weight gain since last admission. Denies any chest pain. Has been compliant with oral Lasix at home. No wheezing. Nonsmoker. Was directed to come to the ER by his primary care physician earlier today. On presentation, MAXIMUM TEMPERATURE 98.4, heart rate in the 60s to 120s, respirations intense 20s, blood pressure in the 100s to 150s, satting in the upper 80s with ambulation on room air. Transitioned to 2 L nasal cannula. Notable labs of hemoglobin 10.2, INR 2.29. Troponin negative 1. Creatinine 1.09. Pro BNP 325 which is his baseline. Chest x-ray shows enlargement of cardiac silhouette with pulmonary vascular congestion and mild bronchitic changes. Given 40 mg of IV Lasix 1 in the ER.    Filed Weights    08/08/13 0530  08/09/13 0629  08/10/13 0455   Weight:  132 kg (291 lb 0.1 oz)  131.88 kg (290 lb 11.9 oz)  130.7 kg (288 lb 2.3 oz)       Filed Weights   10/28/13 1735  Weight: 133.811 kg (295 lb)    Assessment and Plan: William Flores is a 63  y.o. year old male presenting with acute resp failure with hypoxia, acute on chronic diastolic heart failure.   Active Problems:   Acute respiratory failure with hypoxia   1- Acute resp failure w/ hypoxia/Acute on chronic diastolic heart failure -admission weight mildly elevated from recent admission  -IV lasix  -strict ins and outs  -cont supplemental o2 prn  -last ECHO 08/2013 with  EF 55-60% and indeterminant diastolic function in setting of afib  -follow clinically  -? Superimposed bronchitis-follow resp status with diuresis -consider atypical coverage if cough persists despite diuresis-will likely need to watch INR closely with addition of macrolide, tetracycline   2-Afib  -rate controlled currently  -cont dilt  -coumadin per pharmacy  3- DM  -SSI, A1C  4- Anemia  -stable   FEN/GI: heart healthy carb modified diet-low sodium- PPI  Prophylaxis: coumadin  Disposition: pending further evaluation  Code Status:Full Code    Patient Active Problem List   Diagnosis Date Noted  . Acute respiratory failure with hypoxia 10/28/2013  . CHF (congestive heart failure) 08/05/2013  . Morbid obesity 08/05/2013  . Anemia 08/05/2013  . OSA on CPAP 08/05/2013  . Tinea manuum, pedis, and unguium 12/20/2012  . Hyperlipidemia   . Benign neoplasm of colon 09/21/2012  . Personal history of malignant neoplasm of unspecified site in gastrointestinal tract 06/20/2012  . Gout 05/25/2012  . Other and unspecified hyperlipidemia 05/25/2012  . HTN (hypertension) 05/25/2012  . Type 2 diabetes mellitus 05/25/2012  . A-fib 03/29/2012   Past Medical History: Past Medical History  Diagnosis Date  .  Diabetes   . Colon cancer   . Hypertension   . Sleep apnea   . Atrial fibrillation   . Hyperlipidemia     Past Surgical History: Past Surgical History  Procedure Laterality Date  . Colon resection    . Circumcision    . Colonoscopy N/A 09/21/2012    Procedure: COLONOSCOPY;  Surgeon: Inda Castle, MD;  Location: [facility];  Service: Endoscopy;  Laterality: N/A;    Social History: History   Social History  . Marital Status: Divorced    Spouse Name: N/A    Number of Children: 1  . 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 Smoking status: Former Smoker    Quit date: 08/06/1983  . Smokeless tobacco: Never Used  . Alcohol Use: No  . Drug Use: No  . Sexual Activity: None   Other Topics Concern  . None   Social History Narrative  . None    Family History: Family History  Problem Relation Age of Onset  . Breast cancer Mother   . Diabetes Mother     Allergies: Allergies  Allergen Reactions  . Codeine     Headache     Current Facility-Administered Medications  Medication Dose Route Frequency Provider Last Rate Last Dose  . 0.9 %  sodium chloride infusion  250 mL Intravenous PRN Shanda Howells, MD      . allopurinol (ZYLOPRIM) tablet 300 mg  300 mg Oral Daily Shanda Howells, MD      . atorvastatin (LIPITOR) tablet 20 mg  20 mg Oral Daily Shanda Howells, MD      . benzonatate (TESSALON) capsule 100 mg  100 mg Oral BID PRN Shanda Howells, MD      . diltiazem (CARDIZEM CD) 24 hr capsule 240 mg  240 mg Oral Daily Shanda Howells, MD      . insulin aspart (novoLOG) injection 0-9 Units  0-9 Units Subcutaneous 6 times per day Shanda Howells, MD      . ketotifen (ZADITOR) 0.025 % ophthalmic solution 1 drop  1 drop Both Eyes BID Shanda Howells, MD      . lisinopril (PRINIVIL,ZESTRIL) tablet 20 mg  20 mg Oral Daily Shanda Howells, MD      . pantoprazole (PROTONIX) EC tablet 40 mg  40 mg Oral Daily Shanda Howells, MD      . sodium chloride 0.9 % injection 3 mL  3 mL Intravenous Q12H Shanda Howells, MD      . sodium chloride 0.9 % injection 3 mL  3 mL Intravenous Q12H Shanda Howells, MD      . sodium chloride 0.9 % injection 3 mL  3 mL Intravenous PRN Shanda Howells, MD      . sulfacetamide (BLEPH-10) 10 % ophthalmic  solution 1 drop  1 drop Left Eye 6 times per day Shanda Howells, MD       Current Outpatient Prescriptions  Medication Sig Dispense Refill  . allopurinol (ZYLOPRIM) 100 MG tablet Take 100 mg by mouth daily. Patient takes with 100 mg to equal 400 mg      . allopurinol (ZYLOPRIM) 300 MG tablet Take 300 mg by mouth daily. Patient takes with 300 mg to equal 400 mg dose      . aspirin 81 MG tablet Take 81 mg by mouth daily.      Marland Kitchen atorvastatin (LIPITOR) 20 MG tablet Take 20 mg by mouth daily.      Marland Kitchen  azelastine (OPTIVAR) 0.05 % ophthalmic solution Place 2 drops into both eyes 2 (two) times daily.  6 mL  12  . benzonatate (TESSALON) 100 MG capsule Take 1 capsule (100 mg total) by mouth 2 (two) times daily as needed for cough.  20 capsule  0  . diltiazem (CARDIZEM CD) 240 MG 24 hr capsule Take 240 mg by mouth daily.      . furosemide (LASIX) 40 MG tablet TAKE 1 TABLET TWICE A DAY  60 tablet  3  . glucose blood test strip Use to check BG daily  100 each  2  . lisinopril (PRINIVIL,ZESTRIL) 20 MG tablet Take 20 mg by mouth daily.      Marland Kitchen omeprazole (PRILOSEC) 20 MG capsule Take 1 capsule (20 mg total) by mouth daily.  30 capsule  3  . ONETOUCH DELICA LANCETS 00F MISC 1 each by Does not apply route daily. Use one lancet to check BG daily  100 each  2  . sitaGLIPtin-metformin (JANUMET) 50-1000 MG per tablet Take 1 tablet by mouth 2 (two) times daily with a meal.  60 tablet  3  . sulfacetamide (BLEPH-10) 10 % ophthalmic solution Place 1 drop into the left eye every 4 (four) hours.  15 mL  1  . warfarin (COUMADIN) 5 MG tablet Take 7.5-10 mg by mouth See admin instructions. Patient takes 2 ( 5 mg ) for 10 mg on Monday and Friday and all the rest of the day patient takes 7.5 mg ( 1.5 tabs)      . [DISCONTINUED] diltiazem (TIAZAC) 240 MG 24 hr capsule TAKE 1 CAPSULE DAILY  30 capsule  3   Review Of Systems: 12 point ROS negative except as noted above in HPI.  Physical Exam: Filed Vitals:   10/28/13 1945  BP:  105/58  Pulse: 87  Temp:   Resp: 26    General: alert, cooperative and moderately obese HEENT: PERRLA and extra ocular movement intact Heart: S1, S2 normal, no murmur, rub or gallop, regular rate and rhythm Lungs: clear to auscultation, no wheezes or rales and unlabored breathing Abdomen:obese abdomen, non tender, + bowel sounds  Extremities: trace edema bilaterally, 2+ peripheral pulses  Skin:no rashes Neurology: normal without focal findings  Labs and Imaging: Lab Results  Component Value Date/Time   NA 144 10/28/2013  5:46 PM   NA 140 07/18/2013  3:47 PM   K 4.3 10/28/2013  5:46 PM   CL 104 10/28/2013  5:46 PM   CO2 29 10/28/2013  5:46 PM   BUN 21 10/28/2013  5:46 PM   BUN 19 07/18/2013  3:47 PM   CREATININE 1.09 10/28/2013  5:46 PM   CREATININE 1.12 05/25/2012  3:56 PM   GLUCOSE 124* 10/28/2013  5:46 PM   GLUCOSE 112* 07/18/2013  3:47 PM   Lab Results  Component Value Date   WBC 7.1 10/28/2013   HGB 10.2* 10/28/2013   HCT 35.1* 10/28/2013   MCV 84.0 10/28/2013   PLT 228 10/28/2013    Dg Chest 2 View  10/28/2013   CLINICAL DATA:  Shortness of breath, personal history of diabetes, hypertension, atrial fibrillation, carcinoma of the colon  EXAM: CHEST  2 VIEW  COMPARISON:  08/05/2013  FINDINGS: Enlargement of cardiac silhouette with pulmonary vascular congestion.  Mediastinal contours normal.  Minimal chronic peribronchial thickening.  Extra pleural density at the lateral aspects of the hemithoraces bilaterally appears stable question fat.  No acute infiltrate, pleural effusion or pneumothorax.  No acute osseous findings.  IMPRESSION: Enlargement of cardiac silhouette with pulmonary vascular congestion.  Mild chronic bronchitic changes.  No acute abnormalities.   Electronically Signed   By: Lavonia Dana M.D.   On: 10/28/2013 17:06           Shanda Howells MD  Pager: [phone]

## 2013-10-29 DIAGNOSIS — Z79899 Other long term (current) drug therapy: Secondary | ICD-10-CM | POA: Diagnosis not present

## 2013-10-29 DIAGNOSIS — I5033 Acute on chronic diastolic (congestive) heart failure: Secondary | ICD-10-CM | POA: Diagnosis present

## 2013-10-29 DIAGNOSIS — I481 Persistent atrial fibrillation: Secondary | ICD-10-CM | POA: Diagnosis present

## 2013-10-29 DIAGNOSIS — R0602 Shortness of breath: Secondary | ICD-10-CM | POA: Diagnosis present

## 2013-10-29 DIAGNOSIS — I5032 Chronic diastolic (congestive) heart failure: Secondary | ICD-10-CM

## 2013-10-29 DIAGNOSIS — E1165 Type 2 diabetes mellitus with hyperglycemia: Secondary | ICD-10-CM | POA: Diagnosis present

## 2013-10-29 DIAGNOSIS — D638 Anemia in other chronic diseases classified elsewhere: Secondary | ICD-10-CM | POA: Diagnosis present

## 2013-10-29 DIAGNOSIS — N183 Chronic kidney disease, stage 3 (moderate): Secondary | ICD-10-CM | POA: Diagnosis present

## 2013-10-29 DIAGNOSIS — J209 Acute bronchitis, unspecified: Secondary | ICD-10-CM | POA: Diagnosis present

## 2013-10-29 DIAGNOSIS — Z6841 Body Mass Index (BMI) 40.0 and over, adult: Secondary | ICD-10-CM | POA: Diagnosis not present

## 2013-10-29 DIAGNOSIS — I129 Hypertensive chronic kidney disease with stage 1 through stage 4 chronic kidney disease, or unspecified chronic kidney disease: Secondary | ICD-10-CM | POA: Diagnosis present

## 2013-10-29 DIAGNOSIS — Z9114 Patient's other noncompliance with medication regimen: Secondary | ICD-10-CM | POA: Diagnosis present

## 2013-10-29 DIAGNOSIS — Z87891 Personal history of nicotine dependence: Secondary | ICD-10-CM | POA: Diagnosis not present

## 2013-10-29 DIAGNOSIS — Z7901 Long term (current) use of anticoagulants: Secondary | ICD-10-CM | POA: Diagnosis not present

## 2013-10-29 DIAGNOSIS — E785 Hyperlipidemia, unspecified: Secondary | ICD-10-CM | POA: Diagnosis present

## 2013-10-29 DIAGNOSIS — Z7982 Long term (current) use of aspirin: Secondary | ICD-10-CM | POA: Diagnosis not present

## 2013-10-29 DIAGNOSIS — G4733 Obstructive sleep apnea (adult) (pediatric): Secondary | ICD-10-CM | POA: Diagnosis present

## 2013-10-29 DIAGNOSIS — J9601 Acute respiratory failure with hypoxia: Secondary | ICD-10-CM | POA: Diagnosis present

## 2013-10-29 LAB — COMPREHENSIVE METABOLIC PANEL
ALK PHOS: 73 U/L (ref 39–117)
ALT: 21 U/L (ref 0–53)
ANION GAP: 9 (ref 5–15)
AST: 27 U/L (ref 0–37)
Albumin: 3.3 g/dL — ABNORMAL LOW (ref 3.5–5.2)
BUN: 19 mg/dL (ref 6–23)
CO2: 32 meq/L (ref 19–32)
Calcium: 9.3 mg/dL (ref 8.4–10.5)
Chloride: 102 mEq/L (ref 96–112)
Creatinine, Ser: 1.07 mg/dL (ref 0.50–1.35)
GFR, EST AFRICAN AMERICAN: 85 mL/min — AB (ref >90)
GFR, EST NON AFRICAN AMERICAN: 73 mL/min — AB (ref >90)
GLUCOSE: 204 mg/dL — AB (ref 70–99)
POTASSIUM: 4.6 meq/L (ref 3.7–5.3)
Sodium: 143 mEq/L (ref 137–147)
TOTAL PROTEIN: 6.9 g/dL (ref 6.0–8.3)
Total Bilirubin: 0.4 mg/dL (ref 0.3–1.2)

## 2013-10-29 LAB — PROTIME-INR
INR: 1.84 — ABNORMAL HIGH (ref 0.00–1.49)
PROTHROMBIN TIME: 21.4 s — AB (ref 11.6–15.2)

## 2013-10-29 LAB — GLUCOSE, CAPILLARY
GLUCOSE-CAPILLARY: 191 mg/dL — AB (ref 70–99)
GLUCOSE-CAPILLARY: 173 mg/dL — AB (ref 70–99)
GLUCOSE-CAPILLARY: 162 mg/dL — AB (ref 70–99)
Glucose-Capillary: 188 mg/dL — ABNORMAL HIGH (ref 70–99)
Glucose-Capillary: 198 mg/dL — ABNORMAL HIGH (ref 70–99)

## 2013-10-29 LAB — CBC WITH DIFFERENTIAL/PLATELET
BASOS ABS: 0.1 10*3/uL (ref 0.0–0.1)
BASOS PCT: 1 % (ref 0–1)
EOS ABS: 0.1 10*3/uL (ref 0.0–0.7)
Eosinophils Relative: 1 % (ref 0–5)
HCT: 37.1 % — ABNORMAL LOW (ref 39.0–52.0)
Hemoglobin: 10.5 g/dL — ABNORMAL LOW (ref 13.0–17.0)
LYMPHS PCT: 10 % — AB (ref 12–46)
Lymphs Abs: 0.7 10*3/uL (ref 0.7–4.0)
MCH: 24.9 pg — ABNORMAL LOW (ref 26.0–34.0)
MCHC: 28.3 g/dL — AB (ref 30.0–36.0)
MCV: 87.9 fL (ref 78.0–100.0)
MONO ABS: 0.5 10*3/uL (ref 0.1–1.0)
Monocytes Relative: 8 % (ref 3–12)
NEUTROS PCT: 80 % — AB (ref 43–77)
Neutro Abs: 5.3 10*3/uL (ref 1.7–7.7)
PLATELETS: 237 10*3/uL (ref 150–400)
RBC: 4.22 MIL/uL (ref 4.22–5.81)
RDW: 21.4 % — ABNORMAL HIGH (ref 11.5–15.5)
WBC: 6.7 10*3/uL (ref 4.0–10.5)

## 2013-10-29 LAB — TROPONIN I
Troponin I: <0.3 ng/mL (ref <0.30)
Troponin I: <0.3 ng/mL (ref <0.30)
Troponin I: <0.3 ng/mL (ref <0.30)

## 2013-10-29 LAB — STREP PNEUMONIAE URINARY ANTIGEN: Strep Pneumo Urinary Antigen: NEGATIVE

## 2013-10-29 LAB — HEMOGLOBIN A1C
HEMOGLOBIN A1C: 7.9 % — AB (ref <5.7)
Mean Plasma Glucose: 180 mg/dL — ABNORMAL HIGH (ref <117)

## 2013-10-29 MED ORDER — INSULIN ASPART 100 UNIT/ML ~~LOC~~ SOLN
0.0000 [IU] | Freq: Three times a day (TID) | SUBCUTANEOUS | Status: DC
  Administered 2013-10-29: 3 [IU] via SUBCUTANEOUS
  Administered 2013-10-30: 2 [IU] via SUBCUTANEOUS
  Administered 2013-10-30: 3 [IU] via SUBCUTANEOUS

## 2013-10-29 MED ORDER — CETYLPYRIDINIUM CHLORIDE 0.05 % MT LIQD
7.0000 mL | Freq: Two times a day (BID) | OROMUCOSAL | Status: DC
  Administered 2013-10-29 (×2): 7 mL via OROMUCOSAL

## 2013-10-29 MED ORDER — METFORMIN HCL 500 MG PO TABS
1000.0000 mg | ORAL_TABLET | Freq: Two times a day (BID) | ORAL | Status: DC
  Administered 2013-10-29 – 2013-11-05 (×14): 1000 mg via ORAL
  Filled 2013-10-29 (×17): qty 2

## 2013-10-29 MED ORDER — INSULIN ASPART 100 UNIT/ML ~~LOC~~ SOLN: 0.0000 [IU] | Freq: Every day | SUBCUTANEOUS | Status: DC

## 2013-10-29 MED ORDER — WARFARIN SODIUM 7.5 MG PO TABS
7.5000 mg | ORAL_TABLET | ORAL | Status: DC
  Administered 2013-10-30: 7.5 mg via ORAL
  Filled 2013-10-29 (×2): qty 1

## 2013-10-29 MED ORDER — WARFARIN SODIUM 10 MG PO TABS
10.0000 mg | ORAL_TABLET | Freq: Once | ORAL | Status: AC
  Administered 2013-10-29: 10 mg via ORAL
  Filled 2013-10-29: qty 1

## 2013-10-29 MED ORDER — LINAGLIPTIN 5 MG PO TABS
5.0000 mg | ORAL_TABLET | Freq: Every day | ORAL | Status: DC
  Administered 2013-10-29 – 2013-11-04 (×7): 5 mg via ORAL
  Filled 2013-10-29 (×8): qty 1

## 2013-10-29 MED ORDER — SITAGLIPTIN PHOS-METFORMIN HCL 50-1000 MG PO TABS: 1.0000 | ORAL_TABLET | Freq: Two times a day (BID) | ORAL | Status: DC

## 2013-10-29 MED ORDER — AZITHROMYCIN 500 MG PO TABS
500.0000 mg | ORAL_TABLET | Freq: Every day | ORAL | Status: AC
  Administered 2013-10-29 – 2013-11-02 (×5): 500 mg via ORAL
  Filled 2013-10-29 (×5): qty 1

## 2013-10-29 NOTE — H&P (Signed)
[facility] PROGRESS NOTE  William Flores CVE:[id number] DOB: [birth year] DOA: 10/28/2013 PCP: Redge Gainer, MD  Brief narrative: 63 y.o. year old male with HTN, A-fib on Coumadin, chronic diastolic CHF, DM type II, presented to [facility] ED with main concern of one week duration of progressively worsening dyspnea at rest and with exertion over the past month, 10 lbs weight gain, LE swelling, cough productive of yellow sputum. In ED, CXR notable for vascular congestion and mild bronchitis. Given one dose of lasix 40 mg IV. [facility] asked to admit for further evaluation.   Assessment and Plan:    Active Problems:   Acute respiratory failure with hypoxia - secondary to acute on chronic diastolic CHF and superimposed acute bronchitis - cardiology team consulted, input appreciated  - R/O MI with serial CE's - continue lasix 40 mg IV BID - monitor daily weight, strict I's and O's - weight this AM 297 lbs   Acute on chronic diastolic CHF - treat with lasix as noted above and monitor clinical response    Acute bronchitis - add empiric Zithromax - sputum culture requested, urine legionella and strep pneumo    Atrial fibrillation with RVR - continue Cardizem and Coumadin per pharmacy    Anemia of chronic disease  - no signs of active bleeding - CBC in AM   DM type II - appreciate diabetic educator input  - increase SSI coverage to moderate    HLD - continue statin    HTN  - continue lisinopril, lasix, cardizem    Obesity with BMP > 40    OSA  DVT prophylaxis  Coumadin for a-fib   Code Status: Full Family Communication: Pt at bedside Disposition Plan: Home when medically stable  IV Access:   Peripheral IV Procedures and diagnostic studies:    CXR 10/28/2013   Enlargement of cardiac silhouette with pulmonary vascular congestion.  Mild chronic bronchitis   Dg Abd 1 View  10/29/2013  Negative   Medical Consultants:   Cardiology  Other Consultants:   None   Anti-Infectives:    Zithromax 10/27 -->  Faye Ramsay, MD  [facility] Pager [phone]  If 7PM-7AM, please contact night-coverage [email or web address] Password [facility] 10/29/2013, 9:36 AM   LOS: 1 day   HPI/Subjective: No events overnight.   Objective: Filed Vitals:   10/28/13 2147 10/28/13 2313 10/29/13 0459 10/29/13 0907  BP:  145/66 142/77 147/74  Pulse:   103 103  Temp:   98.3 F (36.8 C) 98 F (36.7 C)  TempSrc:   Oral Oral  Resp:   22 24  Height: 5' 4"  (1.626 m)     Weight:   134.9 kg (297 lb 6.4 oz)   SpO2:   94% 93%    Intake/Output Summary (Last 24 hours) at 10/29/13 0936 Last data filed at 10/29/13 0729  Gross per 24 hour  Intake      0 ml  Output   1415 ml  Net  -1415 ml    Exam:   General:  Pt is alert, follows commands appropriately, not in acute distress  Cardiovascular: Irregular rate and rhythm, no rubs, no gallops  Respiratory: Clear to auscultation bilaterally, rhonchi at bases, diminished air movement at bases   Abdomen: Soft, non tender, non distended, bowel sounds present, no guarding  Extremities: +2 bilateral LE pitting edema, pulses DP and PT palpable bilaterally  Neuro: Grossly nonfocal  Data Reviewed: Basic Metabolic Panel:  Recent Labs Lab 10/28/13 1746 10/29/13 0600  NA 144 143  K  4.3 4.6  CL 104 102  CO2 29 32  GLUCOSE 124* 204*  BUN 21 19  CREATININE 1.09 1.07  CALCIUM 9.1 9.3   Liver Function Tests:  Recent Labs Lab 10/28/13 1746 10/29/13 0600  AST 23 27  ALT 21 21  ALKPHOS 69 73  BILITOT 0.4 0.4  PROT 6.8 6.9  ALBUMIN 3.3* 3.3*   CBC:  Recent Labs Lab 10/28/13 1746 10/29/13 0600  WBC 7.1 6.7  NEUTROABS 5.6 5.3  HGB 10.2* 10.5*  HCT 35.1* 37.1*  MCV 84.0 87.9  PLT 228 237   Cardiac Enzymes:  Recent Labs Lab 10/28/13 1746  TROPONINI <0.30   CBG:  Recent Labs Lab 10/28/13 2211 10/28/13 2323 10/29/13 0455 10/29/13 0755  GLUCAP 173* 208* 191* 188*   Scheduled Meds: . allopurinol  300 mg Oral Daily  .  atorvastatin  20 mg Oral Daily  . diltiazem  240 mg Oral Daily  . furosemide  40 mg Intravenous Q12H  . insulin aspart  0-9 Units Subcutaneous 6 times per day  . ketotifen  1 drop Both Eyes BID  . lisinopril  20 mg Oral Daily  . pantoprazole  40 mg Oral Daily  . warfarin  10 mg Oral Once per day on Mon Fri   Continuous Infusions:

## 2013-10-29 NOTE — Progress Notes (Signed)
Inpatient Diabetes Program Recommendations  AACE/ADA: New Consensus Statement on Inpatient Glycemic Control (2013)  Target Ranges:  Prepandial:   less than 140 mg/dL      Peak postprandial:   less than 180 mg/dL (1-2 hours)      Critically ill patients:  140 - 180 mg/dL   Results for PHARES, ZACCONE (MRN [id number]) as of 10/29/2013 10:25  Ref. Range 10/28/2013 22:11 10/28/2013 23:23 10/29/2013 04:55 10/29/2013 07:55  Glucose-Capillary Latest Range: 70-99 mg/dL 173 (H) 208 (H) 191 (H) 188 (H)   Diabetes history: DM2 Outpatient Diabetes medications: Janumet 50-1000 mg BID Current orders for Inpatient glycemic control: Novolog 0-9 units Q4H  Inpatient Diabetes Program Recommendations Correction (SSI): Please consider increasing Novolog correction to moderate scale.  Thanks, Barnie Alderman, RN, MSN, CCRN Diabetes Coordinator Inpatient Diabetes Program [phone] (Team Pager) [phone] (AP office) [phone] [facility] office)

## 2013-10-29 NOTE — Progress Notes (Signed)
Notified MD Doyle Askew that patient has PVC and has heart rate in 140s in telemetry strip. MD Doyle Askew said she will consult cardiology at this time. Vitals taken. Patient sitting at side of bed and denies discomfort, but has resp of 24. 02 sat 93 on 2.5 L [place]. LS clear. MD Doyle Askew aware. Will continue to monitor.

## 2013-10-29 NOTE — Progress Notes (Signed)
ANTICOAGULATION CONSULT NOTE - Follow Up Consult  Pharmacy Consult for Coumadin Indication: atrial fibrillation  Allergies  Allergen Reactions  . Codeine     Headache     Patient Measurements: Height: 5' 4"  (162.6 cm) Weight: 297 lb 6.4 oz (134.9 kg) IBW/kg (Calculated) : 59.2  Vital Signs: Temp: 98 F (36.7 C) (10/27 0907) Temp Source: Oral (10/27 0907) BP: 147/74 mmHg (10/27 0907) Pulse Rate: 103 (10/27 0907)  Labs:  Recent Labs  10/28/13 1746 10/29/13 0600 10/29/13 1130  HGB 10.2* 10.5*  --   HCT 35.1* 37.1*  --   PLT 228 237  --   LABPROT 25.4* 21.4*  --   INR 2.29* 1.84*  --   CREATININE 1.09 1.07  --   TROPONINI <0.30  --  <0.30    Estimated Creatinine Clearance: 91.8 ml/min (by C-G formula based on Cr of 1.07).  Assessment:   INR was therapeutic (2.29) on admission on 10/26, but now has fallen subtherapeutic (1.84).   Home regimen: 7.5 mg daily, except 10 mg on Mondays and Fridays.  Usual 10 mg dose given last night.  Goal of Therapy:  INR 2-3 Monitor platelets by anticoagulation protocol: Yes   Plan:   Coumadin 10 mg again tonight, instead of usual Tuesday 7.5 mg.  Daily PT/INR.  Will plan to resume continue home regimen beginning 10/30/13, unless INR drops again.  Arty Baumgartner, [place] Pager: [phone] 10/29/2013,12:47 PM

## 2013-10-29 NOTE — Consult Note (Addendum)
Name: William Flores is a 63 y.o. male Admit date: 10/28/2013 Referring Physician:  Glennon Hamilton, M.D. Primary Physician:  Redge Gainer, M.D. Primary Cardiologist:  Quay Burow, M.D.  Reason for Consultation:  Chest discomfort  ASSESSMENT:  1. ?Chest discomfort, possibly related to heart failure. Other considerations include demand ischemia from intercurrent illness and rapid heart rate associated with atrial fibrillation. 2. History of chronic diastolic heart failure with acute component 3. History of atrial fibrillation on chronic anticoagulation therapy 4. Obstructive sleep apnea 5. Morbid obesity 6. Systemic hypertension, essential  PLAN: 1. Myocardial infarction should be excluded with serial cardiac markers. 2. Treat any superimposed infection. 3. Rate control with diltiazem as required to keep heart rate less than 90 bpm 4. Component of medication noncompliance. Will need IV diuresis to achieve a state of euvolemia. Suggest furosemide 40 mg IV twice a day. If not significant volume removal, may need to increase to 80 mg every 12 5. Maintain adequate blood pressure control 6. We will follow-up tomorrow but there is not much more to add.   HPI: 63 year old gentleman admitted to the hospital with progressive dyspnea and weight gain. The initial consult was for chest discomfort although the patient denies chest discomfort. He has not taken his diuretic regimen on a regular basis. He denies palpitations.  PMH:   Past Medical History  Diagnosis Date  . Diabetes   . Colon cancer   . Hypertension   . Sleep apnea   . Atrial fibrillation   . Hyperlipidemia     PSH:   Past Surgical History  Procedure Laterality Date  . Colon resection    . Circumcision    . Colonoscopy N/A 09/21/2012    Procedure: COLONOSCOPY;  Surgeon: Inda Castle, MD;  Location: [facility];  Service: Endoscopy;  Laterality: N/A;   Allergies:  Codeine Prior to Admit Meds:   Prescriptions  prior to admission  Medication Sig Dispense Refill  . allopurinol (ZYLOPRIM) 100 MG tablet Take 100 mg by mouth daily. Patient takes with 100 mg to equal 400 mg      . allopurinol (ZYLOPRIM) 300 MG tablet Take 300 mg by mouth daily. Patient takes with 300 mg to equal 400 mg dose      . aspirin 81 MG tablet Take 81 mg by mouth daily.      Marland Kitchen atorvastatin (LIPITOR) 20 MG tablet Take 20 mg by mouth daily.      Marland Kitchen azelastine (OPTIVAR) 0.05 % ophthalmic solution Place 2 drops into both eyes 2 (two) times daily.  6 mL  12  . benzonatate (TESSALON) 100 MG capsule Take 1 capsule (100 mg total) by mouth 2 (two) times daily as needed for cough.  20 capsule  0  . diltiazem (CARDIZEM CD) 240 MG 24 hr capsule Take 240 mg by mouth daily.      . furosemide (LASIX) 40 MG tablet TAKE 1 TABLET TWICE A DAY  60 tablet  3  . glucose blood test strip Use to check BG daily  100 each  2  . lisinopril (PRINIVIL,ZESTRIL) 20 MG tablet Take 20 mg by mouth daily.      Marland Kitchen omeprazole (PRILOSEC) 20 MG capsule Take 1 capsule (20 mg total) by mouth daily.  30 capsule  3  . ONETOUCH DELICA LANCETS 75I MISC 1 each by Does not apply route daily. Use one lancet to check BG daily  100 each  2  . sitaGLIPtin-metformin (JANUMET) 50-1000 MG per tablet Take 1 tablet  by mouth 2 (two) times daily with a meal.  60 tablet  3  . warfarin (COUMADIN) 5 MG tablet Take 7.5-10 mg by mouth See admin instructions. Patient takes 2 ( 5 mg ) for 10 mg on Monday and Friday and all the rest of the day patient takes 7.5 mg ( 1.5 tabs)       Fam HX:    Family History  Problem Relation Age of Onset  . Breast cancer Mother   . Diabetes Mother    Social HX:    History   Social History  . Marital Status: Divorced    Spouse Name: N/A    Number of Children: 1  . 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 Smoking status: Former Smoker    Quit date: 08/06/1983  . Smokeless tobacco: Never Used   . Alcohol Use: No  . Drug Use: No  . Sexual Activity: Not on file   Other Topics Concern  . Not on file   Social History Narrative  . No narrative on file     Review of Systems: Patient is sleeping and difficult to keep awake. He answers questions. He denies chest discomfort. He denies dyspnea. He keeps the refrain "I'm congested" as one of his major concerns. He has not had syncope. No transient neurological complaints. Has noted lower extremity swelling. No abdominal girth increase. No blood in the urine and stool.  Physical Exam: Blood pressure 147/74, pulse 103, temperature 98 F (36.7 C), temperature source Oral, resp. rate 24, height 5' 4"  (1.626 m), weight 297 lb 6.4 oz (134.9 kg), SpO2 93.00%. Weight change:    Morbidly obese but lying flat in bed.  Unable to appropriately assess neck veins. No carotid bruits are heard. Chest reveals diffuse wheezes (slide) and basilar crackles. Rhonchi are heard throughout both lung fields. Irregularly irregular rhythm, with poor rate control. No obvious gallop or murmur. Obese abdomen with no obvious organomegaly. 2+ bilateral lower extremity edema. Pedal pulses are 2+ bilateral at the posterior tibial level Neurological exam reveals a patient who seems somewhat lethargic but otherwise nonfocal.  Labs: Lab Results  Component Value Date   WBC 6.7 10/29/2013   HGB 10.5* 10/29/2013   HCT 37.1* 10/29/2013   MCV 87.9 10/29/2013   PLT 237 10/29/2013    Recent Labs Lab 10/29/13 0600  NA 143  K 4.6  CL 102  CO2 32  BUN 19  CREATININE 1.07  CALCIUM 9.3  PROT 6.9  BILITOT 0.4  ALKPHOS 73  ALT 21  AST 27  GLUCOSE 204*   No results found for this basename: PTT   Lab Results  Component Value Date   INR 1.84* 10/29/2013   INR 2.29* 10/28/2013   INR 1.8 10/07/2013   Lab Results  Component Value Date   TROPONINI <0.30 10/28/2013     Lab Results  Component Value Date   CHOL 126 07/18/2013   CHOL 109 04/19/2013   CHOL 129  12/20/2012   Lab Results  Component Value Date   HDL 24* 07/18/2013   HDL 24* 04/19/2013   HDL 30* 12/20/2012   Lab Results  Component Value Date   LDLCALC 67 07/18/2013   LDLCALC 46 04/19/2013   LDLCALC 70 12/20/2012   Lab Results  Component Value Date   TRIG 177* 07/18/2013   TRIG 194* 04/19/2013   TRIG 144 12/20/2012   Lab Results  Component  Value Date   CHOLHDL 4.8 05/25/2012   No results found for this basename: LDLDIRECT      Radiology:  Dg Chest 2 View  10/28/2013   CLINICAL DATA:  Shortness of breath, personal history of diabetes, hypertension, atrial fibrillation, carcinoma of the colon  EXAM: CHEST  2 VIEW  COMPARISON:  08/05/2013  FINDINGS: Enlargement of cardiac silhouette with pulmonary vascular congestion.  Mediastinal contours normal.  Minimal chronic peribronchial thickening.  Extra pleural density at the lateral aspects of the hemithoraces bilaterally appears stable question fat.  No acute infiltrate, pleural effusion or pneumothorax.  No acute osseous findings.  IMPRESSION: Enlargement of cardiac silhouette with pulmonary vascular congestion.  Mild chronic bronchitic changes.  No acute abnormalities.   Electronically Signed   By: Lavonia Dana M.D.   On: 10/28/2013 17:06   Dg Abd 1 View  10/29/2013   CLINICAL DATA:  Abdominal swelling  EXAM: ABDOMEN - 1 VIEW  COMPARISON:  05/27/2004  FINDINGS: The images are not marked supine or upright. Limited image quality due to large patient size  Normal bowel gas pattern.  No acute abnormality identified.  IMPRESSION: Negative   Electronically Signed   By: Franchot Gallo M.D.   On: 10/29/2013 08:30    EKG:  Atrial fibrillation with moderate rate control at 94 bpm. Interventricular conduction delay  ECHOCARDIOGRAM: 08/2013 Study Conclusions  - Left ventricle: The cavity size was normal. Wall thickness was increased in a pattern of mild LVH. Systolic function was normal. The estimated ejection fraction was in the range of 55% to  60%. Indeterminant diastolic function (atrial fibrillation). Wall motion was normal; there were no regional wall motion abnormalities. - Aortic valve: Trileaflet; moderately calcified leaflets. Sclerosis without stenosis. - Mitral valve: Mildly to moderately calcified annulus. There was no significant regurgitation. - Left atrium: The atrium was mildly dilated. - Right ventricle: The cavity size was mildly dilated. Systolic function was normal. - Pulmonary arteries: No complete TR doppler jet so unable to estimate PA systolic pressure. - Systemic veins: The IVC measured 2.1 cm with < 50% respirophasic variation, suggesting RA pressure 8 mmHg.    Sinclair Grooms 10/29/2013 10:13 AM

## 2013-10-30 DIAGNOSIS — J209 Acute bronchitis, unspecified: Secondary | ICD-10-CM | POA: Diagnosis present

## 2013-10-30 DIAGNOSIS — I5031 Acute diastolic (congestive) heart failure: Secondary | ICD-10-CM | POA: Diagnosis present

## 2013-10-30 DIAGNOSIS — J9601 Acute respiratory failure with hypoxia: Secondary | ICD-10-CM

## 2013-10-30 DIAGNOSIS — I481 Persistent atrial fibrillation: Secondary | ICD-10-CM

## 2013-10-30 LAB — BASIC METABOLIC PANEL
Anion gap: 10 (ref 5–15)
BUN: 20 mg/dL (ref 6–23)
CHLORIDE: 99 meq/L (ref 96–112)
CO2: 31 mEq/L (ref 19–32)
Calcium: 9.3 mg/dL (ref 8.4–10.5)
Creatinine, Ser: 1.13 mg/dL (ref 0.50–1.35)
GFR calc non Af Amer: 68 mL/min — ABNORMAL LOW (ref >90)
GFR, EST AFRICAN AMERICAN: 79 mL/min — AB (ref >90)
Glucose, Bld: 185 mg/dL — ABNORMAL HIGH (ref 70–99)
Potassium: 4.9 mEq/L (ref 3.7–5.3)
Sodium: 140 mEq/L (ref 137–147)

## 2013-10-30 LAB — CBC
HCT: 38.4 % — ABNORMAL LOW (ref 39.0–52.0)
Hemoglobin: 10.6 g/dL — ABNORMAL LOW (ref 13.0–17.0)
MCH: 24.6 pg — AB (ref 26.0–34.0)
MCHC: 27.6 g/dL — ABNORMAL LOW (ref 30.0–36.0)
MCV: 89.1 fL (ref 78.0–100.0)
Platelets: 224 10*3/uL (ref 150–400)
RBC: 4.31 MIL/uL (ref 4.22–5.81)
RDW: 21.1 % — ABNORMAL HIGH (ref 11.5–15.5)
WBC: 6.6 10*3/uL (ref 4.0–10.5)

## 2013-10-30 LAB — GLUCOSE, CAPILLARY
GLUCOSE-CAPILLARY: 161 mg/dL — AB (ref 70–99)
GLUCOSE-CAPILLARY: 177 mg/dL — AB (ref 70–99)
GLUCOSE-CAPILLARY: 123 mg/dL — AB (ref 70–99)
Glucose-Capillary: 183 mg/dL — ABNORMAL HIGH (ref 70–99)
Glucose-Capillary: 173 mg/dL — ABNORMAL HIGH (ref 70–99)
Glucose-Capillary: 124 mg/dL — ABNORMAL HIGH (ref 70–99)

## 2013-10-30 LAB — LEGIONELLA ANTIGEN, URINE

## 2013-10-30 LAB — PROTIME-INR
INR: 2.06 — ABNORMAL HIGH (ref 0.00–1.49)
Prothrombin Time: 23.4 seconds — ABNORMAL HIGH (ref 11.6–15.2)

## 2013-10-30 MED ORDER — INSULIN ASPART 100 UNIT/ML ~~LOC~~ SOLN: 0.0000 [IU] | Freq: Every day | SUBCUTANEOUS | Status: DC

## 2013-10-30 MED ORDER — INSULIN ASPART 100 UNIT/ML ~~LOC~~ SOLN
0.0000 [IU] | Freq: Three times a day (TID) | SUBCUTANEOUS | Status: DC
  Administered 2013-10-30 – 2013-10-31 (×4): 3 [IU] via SUBCUTANEOUS
  Administered 2013-11-01: 2 [IU] via SUBCUTANEOUS
  Administered 2013-11-01 – 2013-11-02 (×4): 3 [IU] via SUBCUTANEOUS
  Administered 2013-11-02 – 2013-11-03 (×2): 2 [IU] via SUBCUTANEOUS
  Administered 2013-11-03: 3 [IU] via SUBCUTANEOUS
  Administered 2013-11-03: 2 [IU] via SUBCUTANEOUS
  Administered 2013-11-04 (×2): 3 [IU] via SUBCUTANEOUS
  Administered 2013-11-04: 2 [IU] via SUBCUTANEOUS
  Administered 2013-11-05: 3 [IU] via SUBCUTANEOUS
  Administered 2013-11-05: 2 [IU] via SUBCUTANEOUS

## 2013-10-30 MED ORDER — ACETAMINOPHEN 325 MG PO TABS
650.0000 mg | ORAL_TABLET | Freq: Four times a day (QID) | ORAL | Status: DC | PRN
  Administered 2013-10-30 – 2013-11-04 (×7): 650 mg via ORAL
  Filled 2013-10-30 (×7): qty 2

## 2013-10-30 NOTE — Progress Notes (Signed)
PROGRESS NOTE  William Flores VOZ:[id number] DOB: 03-30-1950 DOA: 10/28/2013 PCP: Redge Gainer, MD  HPI/Recap of past 54 hours: 63 year old male with past medical history of atrial fibrillation on chronic Coumadin, diastolic heart failure and diabetes mellitus presented to the emergency room on 10/26 with progressively worsening shortness of breath for the last month, productive cough and 10 pound weight gain. Patient was admitted to the hospitalist service for bronchitis plus acute on chronic diastolic heart failure.  Cardiology consultation and patient has been treated with IV Lasix plus antibiotics.  Today, he is doing much better. He denies any  Assessment/Plan: Active Problems:   A-fib: Cardiology seeing. Rate controlled on Cardizem. INR therapeutic.   HTN (hypertension)   DM type 2, uncontrolled, with renal complications: Last Q2V at 7.9. Currently on sliding scale, increased to moderate.    Hyperlipidemia: Continue statin.    Morbid obesity: Patient meets criteria with BMI greater than 40.    OSA on CPAP   Acute respiratory failure with hypoxia: Much improving.   Acute bronchitis: Continue Zithromax  acute on chronic diastolic heart failure: Has diuresed more than 2 L. Currently at 297 with dry weight goal of 295  Code Status: Full code  Family Communication: Left message with family  Disposition Plan: Home once fully diuresed   Consultants:  Cardiology  Procedures:  None  Antibiotics:  Zithromax 10/26-present   Objective: BP 116/73  Pulse 86  Temp(Src) 98.4 F (36.9 C) (Oral)  Resp 24  Ht 5' 4"  (1.626 m)  Wt 135.1 kg (297 lb 13.5 oz)  BMI 51.10 kg/m2  SpO2 89%  Intake/Output Summary (Last 24 hours) at 10/30/13 1643 Last data filed at 10/30/13 1300  Gross per 24 hour  Intake   1680 ml  Output   1525 ml  Net    155 ml   Filed Weights   10/28/13 1735 10/29/13 0459 10/30/13 0300  Weight: 133.811 kg (295 lb) 134.9 kg (297 lb 6.4 oz) 135.1 kg (297  lb 13.5 oz)    Exam:   General:  Alert and oriented 3, no acute distress  Cardiovascular: Irregular rhythm, rate controlled  Respiratory: Decreased breath sounds throughout  Abdomen: Soft, obese, nontender, positive bowel sounds  Musculoskeletal: No clubbing or cyanosis, 1+ pitting edema bilaterally   Data Reviewed: Basic Metabolic Panel:  Recent Labs Lab 10/28/13 1746 10/29/13 0600 10/30/13 0651  NA 144 143 140  K 4.3 4.6 4.9  CL 104 102 99  CO2 29 32 31  GLUCOSE 124* 204* 185*  BUN 21 19 20   CREATININE 1.09 1.07 1.13  CALCIUM 9.1 9.3 9.3   Liver Function Tests:  Recent Labs Lab 10/28/13 1746 10/29/13 0600  AST 23 27  ALT 21 21  ALKPHOS 69 73  BILITOT 0.4 0.4  PROT 6.8 6.9  ALBUMIN 3.3* 3.3*   No results found for this basename: LIPASE, AMYLASE,  in the last 168 hours No results found for this basename: AMMONIA,  in the last 168 hours CBC:  Recent Labs Lab 10/28/13 1746 10/29/13 0600 10/30/13 0651  WBC 7.1 6.7 6.6  NEUTROABS 5.6 5.3  --   HGB 10.2* 10.5* 10.6*  HCT 35.1* 37.1* 38.4*  MCV 84.0 87.9 89.1  PLT 228 237 224   Cardiac Enzymes:    Recent Labs Lab 10/28/13 1746 10/29/13 1130 10/29/13 1516 10/29/13 2100  TROPONINI <0.30 <0.30 <0.30 <0.30   BNP (last 3 results)  Recent Labs  08/05/13 1403 10/28/13 1746  PROBNP 376.6* 325.7*  CBG:  Recent Labs Lab 10/29/13 2022 10/30/13 0106 10/30/13 0412 10/30/13 0807 10/30/13 1154  GLUCAP 162* 161* 183* 173* 124*    No results found for this or any previous visit (from the past 240 hour(s)).   Studies: No results found.  Scheduled Meds: . allopurinol  300 mg Oral Daily  . atorvastatin  20 mg Oral Daily  . azithromycin  500 mg Oral Daily  . diltiazem  240 mg Oral Daily  . furosemide  40 mg Intravenous Q12H  . insulin aspart  0-15 Units Subcutaneous TID WC  . insulin aspart  0-5 Units Subcutaneous QHS  . ketotifen  1 drop Both Eyes BID  . linagliptin  5 mg Oral Q supper    And  . metFORMIN  1,000 mg Oral BID WC  . lisinopril  20 mg Oral Daily  . pantoprazole  40 mg Oral Daily  . sodium chloride  3 mL Intravenous Q12H  . sodium chloride  3 mL Intravenous Q12H  . warfarin  10 mg Oral Once per day on Mon Fri  . warfarin  7.5 mg Oral Once per day on Sun Tue Wed Thu Sat  . Warfarin - Pharmacist Dosing Inpatient   Does not apply q1800    Continuous Infusions:    Time spent: 15 minutes  [place] [facility] Pager [phone]. If 7PM-7AM, please contact night-coverage at [email or web address], password [facility] 10/30/2013, 4:43 PM  LOS: 2 days

## 2013-10-30 NOTE — Progress Notes (Signed)
Patient Profile: William Flores is a 63 y.o. year old male with h/o atrial fibrillation on chronic anticoagulation and chronic diastolic HF (EF 16-10% on echo 08/2013) presenting with acute resp failure with hypoxia in the setting of acute on chronic diastolic heart failure.    Subjective: Feels better other than productive cough. No fever or chills. Denies chest pain. No dyspnea.   Objective: Vital signs in last 24 hours: Temp:  [98.3 F (36.8 C)-99.2 F (37.3 C)] 98.3 F (36.8 C) (10/27 2102) Pulse Rate:  [90-100] 100 (10/28 0300) Resp:  [21-22] 22 (10/28 0300) BP: (119-136)/(60-72) 136/61 mmHg (10/28 0300) SpO2:  [93 %-98 %] 98 % (10/28 0300) Weight:  [297 lb 13.5 oz (135.1 kg)] 297 lb 13.5 oz (135.1 kg) (10/28 0300) Last BM Date: 10/28/13  Intake/Output from previous day: 10/27 0701 - 10/28 0700 In: 720 [P.O.:720] Out: 1700 [Urine:1700] Intake/Output this shift: Total I/O In: 480 [P.O.:480] Out: 400 [Urine:400]  Medications Current Facility-Administered Medications  Medication Dose Route Frequency Provider Last Rate Last Dose  . 0.9 %  sodium chloride infusion  250 mL Intravenous PRN Shanda Howells, MD      . allopurinol (ZYLOPRIM) tablet 300 mg  300 mg Oral Daily Shanda Howells, MD   300 mg at 10/30/13 1013  . atorvastatin (LIPITOR) tablet 20 mg  20 mg Oral Daily Shanda Howells, MD   20 mg at 10/30/13 1013  . azithromycin (ZITHROMAX) tablet 500 mg  500 mg Oral Daily Theodis Blaze, MD   500 mg at 10/30/13 1013  . benzonatate (TESSALON) capsule 100 mg  100 mg Oral BID PRN Shanda Howells, MD      . diltiazem (CARDIZEM CD) 24 hr capsule 240 mg  240 mg Oral Daily Shanda Howells, MD   240 mg at 10/30/13 1016  . furosemide (LASIX) injection 40 mg  40 mg Intravenous Q12H Shanda Howells, MD   40 mg at 10/30/13 0527  . insulin aspart (novoLOG) injection 0-15 Units  0-15 Units Subcutaneous TID WC Theodis Blaze, MD   3 Units at 10/30/13 1014  . insulin aspart (novoLOG) injection 0-5  Units  0-5 Units Subcutaneous QHS Theodis Blaze, MD      . ketotifen (ZADITOR) 0.025 % ophthalmic solution 1 drop  1 drop Both Eyes BID Shanda Howells, MD   1 drop at 10/29/13 9604  . linagliptin (TRADJENTA) tablet 5 mg  5 mg Oral Q supper Theodis Blaze, MD   5 mg at 10/29/13 1711   And  . metFORMIN (GLUCOPHAGE) tablet 1,000 mg  1,000 mg Oral BID WC Theodis Blaze, MD   1,000 mg at 10/30/13 0756  . lisinopril (PRINIVIL,ZESTRIL) tablet 20 mg  20 mg Oral Daily Shanda Howells, MD   20 mg at 10/30/13 1013  . pantoprazole (PROTONIX) EC tablet 40 mg  40 mg Oral Daily Shanda Howells, MD   40 mg at 10/30/13 1013  . sodium chloride 0.9 % injection 3 mL  3 mL Intravenous Q12H Shanda Howells, MD   3 mL at 10/28/13 2316  . sodium chloride 0.9 % injection 3 mL  3 mL Intravenous Q12H Shanda Howells, MD   3 mL at 10/30/13 1015  . sodium chloride 0.9 % injection 3 mL  3 mL Intravenous PRN Shanda Howells, MD      . warfarin (COUMADIN) tablet 10 mg  10 mg Oral Once per day on Mon Fri Carl Best   10 mg at 10/28/13 2317  . warfarin (  COUMADIN) tablet 7.5 mg  7.5 mg Oral Once per day on Sun Tue Wed Thu Sat Arty Baumgartner, [facility]      . Warfarin - Pharmacist Dosing Inpatient   Does not apply q1800 Lavenia Atlas, [facility]        PE: General appearance: alert, cooperative and no distress Neck: no carotid bruit and no JVD Lungs: clear to auscultation bilaterally Heart: irregularly irregular rhythm Extremities: trace LEE Pulses: 2+ and symmetric Skin: warm and dry Neurologic: Grossly normal  Lab Results:   Recent Labs  10/28/13 1746 10/29/13 0600 10/30/13 0651  WBC 7.1 6.7 6.6  HGB 10.2* 10.5* 10.6*  HCT 35.1* 37.1* 38.4*  PLT 228 237 224   BMET  Recent Labs  10/28/13 1746 10/29/13 0600 10/30/13 0651  NA 144 143 140  K 4.3 4.6 4.9  CL 104 102 99  CO2 29 32 31  GLUCOSE 124* 204* 185*  BUN 21 19 20   CREATININE 1.09 1.07 1.13  CALCIUM 9.1 9.3 9.3   PT/INR  Recent Labs  10/28/13 1746  10/29/13 0600 10/30/13 0651  LABPROT 25.4* 21.4* 23.4*  INR 2.29* 1.84* 2.06*   Cardiac Panel (last 3 results)  Recent Labs  10/29/13 1130 10/29/13 1516 10/29/13 2100  TROPONINI <0.30 <0.30 <0.30    Assessment/Plan  Active Problems:   Acute respiratory failure with hypoxia    1 . A/C Diastolic CHF: Still volume overloaded. No dyspnea. BNP on admit only 325.7. He has had good diuresis with IV Lasix. Net negative 2.2L. Would continue IV Lasix today.    2. Atrial Fibrillation: Continue PO Cardizem for rate control and warfarin for AC. INR is therapeutic at 2.06.     LOS: 2 days    Brittainy M. Ladoris Gene 10/30/2013 11:34 AM  I have personally seen and examined this patient with Lyda Jester, PA-C. I agree with the assessment and plan as outlined above. He has diuresed well and is clinically improved but is still volume overloaded. Dry weight is around 290. He is at 297 today. Will see again in am.   Brenda Cowher 10/30/2013 12:39 PM

## 2013-10-30 NOTE — Progress Notes (Signed)
ANTICOAGULATION CONSULT NOTE - Follow Up Consult  Pharmacy Consult for Coumadin Indication: atrial fibrillation  Allergies  Allergen Reactions  . Codeine     Headache     Patient Measurements: Height: 5' 4"  (162.6 cm) Weight: 297 lb 13.5 oz (135.1 kg) IBW/kg (Calculated) : 59.2  Vital Signs: Temp: 98.4 F (36.9 C) (10/28 1424) Temp Source: Oral (10/28 1424) BP: 116/73 mmHg (10/28 1424) Pulse Rate: 86 (10/28 1424)  Labs:  Recent Labs  10/28/13 1746 10/29/13 0600 10/29/13 1130 10/29/13 1516 10/29/13 2100 10/30/13 0651  HGB 10.2* 10.5*  --   --   --  10.6*  HCT 35.1* 37.1*  --   --   --  38.4*  PLT 228 237  --   --   --  224  LABPROT 25.4* 21.4*  --   --   --  23.4*  INR 2.29* 1.84*  --   --   --  2.06*  CREATININE 1.09 1.07  --   --   --  1.13  TROPONINI <0.30  --  <0.30 <0.30 <0.30  --     Estimated Creatinine Clearance: 87 ml/min (by C-G formula based on Cr of 1.13).  Assessment: 63 y/o male on chronic warfarin for Afib. INR is therapeutic on low end of goal. Was given extra yesterday. No bleeding noted, CBC stable.  Home regimen: 7.5 mg daily, except 10 mg on Mondays and Fridays.    Goal of Therapy:  INR 2-3 Monitor platelets by anticoagulation protocol: Yes   Plan:  - Warfarin 7.5 mg PO tonight - INR daily  [facility], Pharm.D., BCPS Clinical Pharmacist Pager: [phone] 10/30/2013 3:11 PM

## 2013-10-31 DIAGNOSIS — I482 Chronic atrial fibrillation: Secondary | ICD-10-CM

## 2013-10-31 DIAGNOSIS — I5031 Acute diastolic (congestive) heart failure: Secondary | ICD-10-CM

## 2013-10-31 LAB — GLUCOSE, CAPILLARY
Glucose-Capillary: 156 mg/dL — ABNORMAL HIGH (ref 70–99)
Glucose-Capillary: 135 mg/dL — ABNORMAL HIGH (ref 70–99)
Glucose-Capillary: 180 mg/dL — ABNORMAL HIGH (ref 70–99)
Glucose-Capillary: 195 mg/dL — ABNORMAL HIGH (ref 70–99)

## 2013-10-31 LAB — PROTIME-INR
INR: 2.09 — AB (ref 0.00–1.49)
PROTHROMBIN TIME: 23.7 s — AB (ref 11.6–15.2)

## 2013-10-31 MED ORDER — WARFARIN SODIUM 10 MG PO TABS
10.0000 mg | ORAL_TABLET | Freq: Once | ORAL | Status: AC
  Administered 2013-10-31: 10 mg via ORAL
  Filled 2013-10-31: qty 1

## 2013-10-31 MED ORDER — FUROSEMIDE 10 MG/ML IJ SOLN
40.0000 mg | Freq: Three times a day (TID) | INTRAMUSCULAR | Status: DC
  Administered 2013-10-31 – 2013-11-01 (×3): 40 mg via INTRAVENOUS
  Filled 2013-10-31 (×3): qty 4

## 2013-10-31 MED ORDER — DILTIAZEM HCL ER COATED BEADS 300 MG PO CP24
300.0000 mg | ORAL_CAPSULE | Freq: Every day | ORAL | Status: DC
  Administered 2013-11-01 – 2013-11-03 (×3): 300 mg via ORAL
  Filled 2013-10-31 (×3): qty 1

## 2013-10-31 MED ORDER — HYDROCOD POLST-CHLORPHEN POLST 10-8 MG/5ML PO LQCR
5.0000 mL | Freq: Two times a day (BID) | ORAL | Status: DC | PRN
  Administered 2013-10-31 – 2013-11-05 (×8): 5 mL via ORAL
  Filled 2013-10-31 (×9): qty 5

## 2013-10-31 MED ORDER — DILTIAZEM HCL ER COATED BEADS 120 MG PO CP24
120.0000 mg | ORAL_CAPSULE | ORAL | Status: AC
  Administered 2013-10-31: 120 mg via ORAL
  Filled 2013-10-31 (×2): qty 1

## 2013-10-31 NOTE — Progress Notes (Signed)
ANTICOAGULATION CONSULT NOTE - Follow Up Consult  Pharmacy Consult for Coumadin Indication: atrial fibrillation  Allergies  Allergen Reactions  . Codeine     Headache     Patient Measurements: Height: 4' 11"  (149.9 cm) Weight: 128 lb (58.06 kg) IBW/kg (Calculated) : 47.7  Vital Signs: Temp: 98.1 F (36.7 C) (10/29 0546) Temp Source: Oral (10/29 0546) BP: 140/78 mmHg (10/29 0546) Pulse Rate: 86 (10/29 0546)  Labs:  Recent Labs  10/28/13 1746 10/29/13 0600 10/29/13 1130 10/29/13 1516 10/29/13 2100 10/30/13 0651 10/31/13 0645  HGB 10.2* 10.5*  --   --   --  10.6*  --   HCT 35.1* 37.1*  --   --   --  38.4*  --   PLT 228 237  --   --   --  224  --   LABPROT 25.4* 21.4*  --   --   --  23.4* 23.7*  INR 2.29* 1.84*  --   --   --  2.06* 2.09*  CREATININE 1.09 1.07  --   --   --  1.13  --   TROPONINI <0.30  --  <0.30 <0.30 <0.30  --   --     Estimated Creatinine Clearance: 50.4 ml/min (by C-G formula based on Cr of 1.13).  Assessment: 63 y/o male on chronic warfarin for Afib. INR is therapeutic on low end of goal. No bleeding noted, CBC stable.  Home regimen: 7.5 mg daily, except 10 mg on Mondays and Fridays.    Goal of Therapy:  INR 2-3 Monitor platelets by anticoagulation protocol: Yes   Plan:  - Warfarin 10 mg PO tonight - INR daily   Thanks for allowing pharmacy to be a part of this patient's care.  Excell Seltzer, PharmD Clinical Pharmacist, [phone] 10/31/2013 12:22 PM

## 2013-10-31 NOTE — Progress Notes (Signed)
PROGRESS NOTE  RIGGS DINEEN RXV:[id number] DOB: [birth year] DOA: 10/28/2013 PCP: Redge Gainer, MD  HPI/Recap of past 56 hours: 63 year old male with past medical history of atrial fibrillation on chronic Coumadin, diastolic heart failure and diabetes mellitus presented to the emergency room on 10/26 with progressively worsening shortness of breath for the last month, productive cough and 10 pound weight gain. Patient was admitted to the hospitalist service for bronchitis plus acute on chronic diastolic heart failure.  Cardiology consultation and patient has been treated with IV Lasix plus antibiotics.  Patient continues to diurese well. Breathing more comfortably.  Assessment/Plan: Active Problems:   A-fib: Cardiology seeing. Overall rate controlled, with times greater than 100, will increase Cardizem.. INR therapeutic.   HTN (hypertension)   DM type 2, uncontrolled, with renal complications: Last J0D at 7.9. Currently on sliding scale, increased to moderate.    Hyperlipidemia: Continue statin.    Morbid obesity: Patient meets criteria with BMI greater than 40.    OSA on CPAP   Acute respiratory failure with hypoxia: Much improving.   Acute bronchitis: Continue Zithromax  acute on chronic diastolic heart failure: Has diuresed more than 3 L Currently at 295 with goal of 290. Increase Lasix to 3 times a day.  Code Status: Full code  Family Communication: Left message with family  Disposition Plan: Home once fully diuresed   Consultants:  Cardiology  Procedures:  None  Antibiotics:  Zithromax 10/26-present   Objective: BP 113/69  Pulse 101  Temp(Src) 98 F (36.7 C) (Oral)  Resp 22  Ht 4' 11"  (1.499 m)  Wt 58.06 kg (128 lb)  BMI 25.84 kg/m2  SpO2 83%  Intake/Output Summary (Last 24 hours) at 10/31/13 1449 Last data filed at 10/31/13 1249  Gross per 24 hour  Intake    243 ml  Output   1500 ml  Net  -1257 ml   Filed Weights   10/29/13 0459 10/30/13 0300  10/30/13 1830  Weight: 134.9 kg (297 lb 6.4 oz) 135.1 kg (297 lb 13.5 oz) 58.06 kg (128 lb)    Exam:   General:  Alert and oriented 3, no acute distress, mildly labored breathing  Cardiovascular: Irregular rhythm, borderline tachycardia  Respiratory: Decreased breath sounds throughout  Abdomen: Soft, obese, nontender, positive bowel sounds  Musculoskeletal: No clubbing or cyanosis, 1+ pitting edema bilaterally   Data Reviewed: Basic Metabolic Panel:  Recent Labs Lab 10/28/13 1746 10/29/13 0600 10/30/13 0651  NA 144 143 140  K 4.3 4.6 4.9  CL 104 102 99  CO2 29 32 31  GLUCOSE 124* 204* 185*  BUN 21 19 20   CREATININE 1.09 1.07 1.13  CALCIUM 9.1 9.3 9.3   Liver Function Tests:  Recent Labs Lab 10/28/13 1746 10/29/13 0600  AST 23 27  ALT 21 21  ALKPHOS 69 73  BILITOT 0.4 0.4  PROT 6.8 6.9  ALBUMIN 3.3* 3.3*   No results found for this basename: LIPASE, AMYLASE,  in the last 168 hours No results found for this basename: AMMONIA,  in the last 168 hours CBC:  Recent Labs Lab 10/28/13 1746 10/29/13 0600 10/30/13 0651  WBC 7.1 6.7 6.6  NEUTROABS 5.6 5.3  --   HGB 10.2* 10.5* 10.6*  HCT 35.1* 37.1* 38.4*  MCV 84.0 87.9 89.1  PLT 228 237 224   Cardiac Enzymes:    Recent Labs Lab 10/28/13 1746 10/29/13 1130 10/29/13 1516 10/29/13 2100  TROPONINI <0.30 <0.30 <0.30 <0.30   BNP (last 3 results)  Recent  Labs  08/05/13 1403 10/28/13 1746  PROBNP 376.6* 325.7*   CBG:  Recent Labs Lab 10/30/13 1154 10/30/13 1658 10/30/13 2053 10/31/13 0759 10/31/13 1202  GLUCAP 124* 177* 123* 156* 135*    No results found for this or any previous visit (from the past 240 hour(s)).   Studies: No results found.  Scheduled Meds: . allopurinol  300 mg Oral Daily  . atorvastatin  20 mg Oral Daily  . azithromycin  500 mg Oral Daily  . [START ON 11/01/2013] diltiazem  300 mg Oral Daily  . furosemide  40 mg Intravenous 3 times per day  . insulin aspart   0-15 Units Subcutaneous TID WC  . insulin aspart  0-5 Units Subcutaneous QHS  . ketotifen  1 drop Both Eyes BID  . linagliptin  5 mg Oral Q supper   And  . metFORMIN  1,000 mg Oral BID WC  . lisinopril  20 mg Oral Daily  . pantoprazole  40 mg Oral Daily  . sodium chloride  3 mL Intravenous Q12H  . sodium chloride  3 mL Intravenous Q12H  . warfarin  10 mg Oral ONCE-1800  . Warfarin - Pharmacist Dosing Inpatient   Does not apply q1800    Continuous Infusions:    Time spent: 15 minutes  [place] [facility] Pager [phone]. If 7PM-7AM, please contact night-coverage at [email or web address], password [facility] 10/31/2013, 2:49 PM  LOS: 3 days

## 2013-10-31 NOTE — Care Management Note (Signed)
    Page 1 of 1   11/05/2013     3:58:29 PM CARE MANAGEMENT NOTE 11/05/2013  Patient:  William Flores, William Flores   Account Number:  [id number]  Date Initiated:  10/31/2013  Documentation initiated by:  Tomi Bamberger  Subjective/Objective Assessment:   dx acute resp failure with hypoxia  admit- lives alone.     Action/Plan:   Anticipated DC Date:  11/01/2013   Anticipated DC Plan:  [place]  CM consult      Choice offered to / List presented to:             Status of service:  Completed, signed off Medicare Important Message given?  NO (If response is "NO", the following Medicare IM given date fields will be blank) Date Medicare IM given:   Medicare IM given by:   Date Additional Medicare IM given:   Additional Medicare IM given by:    Discharge Disposition:  HOME/SELF CARE  Per UR Regulation:  Reviewed for med. necessity/level of care/duration of stay  If discussed at [place] of Stay Meetings, dates discussed:    Comments:  10/31/13 [place], BSN [phone] patient lives alone, no needs anticipated.

## 2013-10-31 NOTE — Progress Notes (Signed)
Patient Name: William Flores Date of Encounter: 10/31/2013  Active Problems:   A-fib   HTN (hypertension)   DM type 2, uncontrolled, with renal complications   Hyperlipidemia   Morbid obesity   OSA on CPAP   Acute respiratory failure with hypoxia   Acute bronchitis   Acute diastolic congestive heart failure    Patient Profile: William Flores is a 63 year old Caucasian male with PMH of atrial fibrillation (on chronic anticoagulation), chronic diastolic HF (EF 10-27% on Echo 08/2013), Type II Diabetes Mellitus, HTN, and HLD presenting to [facility] ED on 10/28/2013 with acute resp failure with hypoxia in the setting of acute on chronic diastolic heart failure.    SUBJECTIVE: Denies any chest pain, chest pressure, or palpitations. Says he is still have a productive cough (yellow mucus) that is worse in the mornings and improves as the day progresses. Denies any shortness of breath or wheezing.  OBJECTIVE Filed Vitals:   10/30/13 2000 10/30/13 2052 10/31/13 0000 10/31/13 0546  BP:  126/60  140/78  Pulse:  86  86  Temp:  98.2 F (36.8 C)  98.1 F (36.7 C)  TempSrc:  Oral  Oral  Resp: 22 20 21 21   Height:      Weight:      SpO2:  96%  94%    Intake/Output Summary (Last 24 hours) at 10/31/13 0924 Last data filed at 10/31/13 0710  Gross per 24 hour  Intake   1083 ml  Output   1600 ml  Net   -517 ml   Filed Weights   10/29/13 0459 10/30/13 0300 10/30/13 1830  Weight: 297 lb 6.4 oz (134.9 kg) 297 lb 13.5 oz (135.1 kg) 128 lb (58.06 kg)    PHYSICAL EXAM General: Well developed, well nourished, male in no acute distress. Head: Normocephalic, atraumatic.  Neck: Supple without bruits, JVD 7-8cm. Lungs:  Resp regular. Decreased breath sounds at bases bilaterally. No wheezing or rhonchi. Heart: Irregularly irregular, S1, S2, no S3, S4, no murmur; no rub. Abdomen: Soft, non-tender, non-distended, BS + x 4.  Extremities: No clubbing, no cyanosis, 1+ edema up to mid-shins  bilaterally.  Neuro: Alert and oriented X 3. Moves all extremities spontaneously. Psych: Normal affect.  LABS: CBC: Recent Labs  10/28/13 1746 10/29/13 0600 10/30/13 0651  WBC 7.1 6.7 6.6  NEUTROABS 5.6 5.3  --   HGB 10.2* 10.5* 10.6*  HCT 35.1* 37.1* 38.4*  MCV 84.0 87.9 89.1  PLT 228 237 224   INR: Recent Labs  10/31/13 0645  INR 2.53*   Basic Metabolic Panel: Recent Labs  10/29/13 0600 10/30/13 0651  NA 143 140  K 4.6 4.9  CL 102 99  CO2 32 31  GLUCOSE 204* 185*  BUN 19 20  CREATININE 1.07 1.13  CALCIUM 9.3 9.3   Liver Function Tests: Recent Labs  10/28/13 1746 10/29/13 0600  AST 23 27  ALT 21 21  ALKPHOS 69 73  BILITOT 0.4 0.4  PROT 6.8 6.9  ALBUMIN 3.3* 3.3*   Cardiac Enzymes: Recent Labs  10/29/13 1130 10/29/13 1516 10/29/13 2100  TROPONINI <0.30 <0.30 <0.30   BNP: Pro B Natriuretic peptide (BNP)  Date/Time Value Ref Range Status  10/28/2013  5:46 PM 325.7* 0 - 125 pg/mL Final  08/05/2013  2:03 PM 376.6* 0 - 125 pg/mL Final   Hemoglobin A1C: Recent Labs  10/28/13 1746  HGBA1C 7.9*    TELE: Atrial fibrillation with several episodes of tachycardia (rate  of 110-130 during those times).   Current Medications:  . allopurinol  300 mg Oral Daily  . atorvastatin  20 mg Oral Daily  . azithromycin  500 mg Oral Daily  . diltiazem  240 mg Oral Daily  . furosemide  40 mg Intravenous Q12H  . insulin aspart  0-15 Units Subcutaneous TID WC  . insulin aspart  0-5 Units Subcutaneous QHS  . ketotifen  1 drop Both Eyes BID  . linagliptin  5 mg Oral Q supper   And  . metFORMIN  1,000 mg Oral BID WC  . lisinopril  20 mg Oral Daily  . pantoprazole  40 mg Oral Daily  . sodium chloride  3 mL Intravenous Q12H  . sodium chloride  3 mL Intravenous Q12H  . warfarin  10 mg Oral Once per day on Mon Fri  . warfarin  7.5 mg Oral Once per day on Sun Tue Wed Thu Sat  . Warfarin - Pharmacist Dosing Inpatient   Does not apply q1800      ASSESSMENT AND  PLAN:  1 . Acute on Chronic Diastolic CHF:  - Echocardiogram on 08/07/2013 showed EF of 55-60%. BNP on 10/28/2013 was 325.7. - Net Output since admission is  2.8L. Dry weight for patient is 290lbs and was 297 on 10/30/2013. Second recording of the day inaccurrate.  - Continue to obtain daily weights.  - Continue diuresis with 31m IV BID for he seems to be responding well to the Lasix and his clinical picture is improving, with serum creatinine and potassium remaining stable.   2. Atrial Fibrillation:  - Still having episodes of Atrial Fibrillation and has been tachycardiac up to 130 according to telemetry.  - Continue PO Cardizem CD 2450mdaily for rate control. SPEP range 116-141 last 24 hours. Heart rate at baseline is controlled. Would increase Cardizem CD up to 300 mg daily. - Continue Warfarin for anticoagulation. INR is therapeutic at 2.09 on 10/31/2013.    Seen with BrDineen KidPA Student.  Signed, RhRosaria Ferries PA-C 9:24 AM 10/31/2013  I have personally seen and examined this patient with RhRosaria FerriesPA-C. I agree with the assessment and plan as outlined above. He continues to improve clinically. Lungs are clear. HR is irregular. 1+ bilateral lower ext edema. Diuresing well on IV Lasix but still not at goal dry weight. Would continue IV Lasix for one more day. May even increase to TID today for better diuresis. Can titrate Cardizem CD up to 300 mg daily for better HR control. We will follow with you.   Jackelyn Illingworth 10/31/2013 10:27 AM

## 2013-11-01 DIAGNOSIS — I10 Essential (primary) hypertension: Secondary | ICD-10-CM

## 2013-11-01 LAB — BASIC METABOLIC PANEL
Anion gap: 8 (ref 5–15)
BUN: 26 mg/dL — ABNORMAL HIGH (ref 6–23)
CO2: 35 mEq/L — ABNORMAL HIGH (ref 19–32)
Calcium: 8.9 mg/dL (ref 8.4–10.5)
Chloride: 97 mEq/L (ref 96–112)
Creatinine, Ser: 1.18 mg/dL (ref 0.50–1.35)
GFR, EST AFRICAN AMERICAN: 75 mL/min — AB (ref >90)
GFR, EST NON AFRICAN AMERICAN: 65 mL/min — AB (ref >90)
Glucose, Bld: 158 mg/dL — ABNORMAL HIGH (ref 70–99)
POTASSIUM: 4.9 meq/L (ref 3.7–5.3)
Sodium: 140 mEq/L (ref 137–147)

## 2013-11-01 LAB — GLUCOSE, CAPILLARY
GLUCOSE-CAPILLARY: 176 mg/dL — AB (ref 70–99)
Glucose-Capillary: 161 mg/dL — ABNORMAL HIGH (ref 70–99)
Glucose-Capillary: 130 mg/dL — ABNORMAL HIGH (ref 70–99)
Glucose-Capillary: 170 mg/dL — ABNORMAL HIGH (ref 70–99)

## 2013-11-01 LAB — PROTIME-INR
INR: 2.29 — AB (ref 0.00–1.49)
Prothrombin Time: 25.4 seconds — ABNORMAL HIGH (ref 11.6–15.2)

## 2013-11-01 MED ORDER — FUROSEMIDE 10 MG/ML IJ SOLN
80.0000 mg | Freq: Two times a day (BID) | INTRAMUSCULAR | Status: DC
  Administered 2013-11-01 – 2013-11-04 (×6): 80 mg via INTRAVENOUS
  Filled 2013-11-01 (×6): qty 8

## 2013-11-01 MED ORDER — WARFARIN SODIUM 7.5 MG PO TABS
7.5000 mg | ORAL_TABLET | ORAL | Status: DC
  Administered 2013-11-02 – 2013-11-03 (×2): 7.5 mg via ORAL
  Filled 2013-11-01 (×3): qty 1

## 2013-11-01 MED ORDER — WARFARIN SODIUM 10 MG PO TABS
10.0000 mg | ORAL_TABLET | ORAL | Status: DC
  Administered 2013-11-01 – 2013-11-04 (×2): 10 mg via ORAL
  Filled 2013-11-01 (×2): qty 1

## 2013-11-01 NOTE — Progress Notes (Addendum)
PROGRESS NOTE  William Flores TDS:[id number] DOB: 1950-03-01 DOA: 10/28/2013 PCP: Redge Gainer, MD  HPI/Recap of past 67 hours: 63 year old male with past medical history of atrial fibrillation on chronic Coumadin, diastolic heart failure and diabetes mellitus presented to the emergency room on 10/26 with progressively worsening shortness of breath for the last month, productive cough and 10 pound weight gain. Patient was admitted to the hospitalist service for bronchitis plus acute on chronic diastolic heart failure.  Cardiology consultation and patient has been treated with IV Lasix plus antibiotics.  Patient has diuresed about 2.8 L, although despite increased Lasix to 3 times a day yesterday, has resulted only and an additional half liter. Patient's of resting comfortably without any complaints. Breathing easier.  Assessment/Plan: Active Problems:   A-fib: Cardiology seeing. Better controlled rate with increase in Cardizem yesterday. INR therapeutic.   HTN (hypertension): Blood pressure is well controlled    DM type 2, uncontrolled, with renal complications: Last W6O at 7.9. Currently on sliding scale. CBG staying under 200    Hyperlipidemia: Continue statin.    Morbid obesity: Patient meets criteria with BMI greater than 40.    OSA on CPAP   Acute respiratory failure with hypoxia: Much improving.   Acute bronchitis: Completed Zithromax 10/31   acute on chronic diastolic heart failure: Appreciate cardiology help. Lasix being decreased back to twice a day to get better diuresis hopefully.  Code Status: Full code  Family Communication: Left message with family  Disposition Plan: Home once fully diuresed   Consultants:  Cardiology  Procedures:  None  Antibiotics:  Zithromax 10/26-10/31   Objective: BP 112/70  Pulse 93  Temp(Src) 97.7 F (36.5 C) (Oral)  Resp 22  Ht 4' 11"  (1.499 m)  Wt 134.2 kg (295 lb 13.7 oz)  BMI 59.72 kg/m2  SpO2 92%  Intake/Output  Summary (Last 24 hours) at 11/01/13 1437 Last data filed at 11/01/13 1401  Gross per 24 hour  Intake    720 ml  Output   1200 ml  Net   -480 ml   Filed Weights   10/30/13 0300 10/30/13 1830 11/01/13 0500  Weight: 135.1 kg (297 lb 13.5 oz) 58.06 kg (128 lb) 134.2 kg (295 lb 13.7 oz)    Exam:   General:  Resting comfortably, no acute distress,  Cardiovascular: Irregular rhythm  Respiratory: Decreased breath sounds throughout  Abdomen: Soft, obese, nontender, positive bowel sounds  Musculoskeletal: No clubbing or cyanosis, 1+ pitting edema bilaterally   Data Reviewed: Basic Metabolic Panel:  Recent Labs Lab 10/28/13 1746 10/29/13 0600 10/30/13 0651 11/01/13 1116  NA 144 143 140 140  K 4.3 4.6 4.9 4.9  CL 104 102 99 97  CO2 29 32 31 35*  GLUCOSE 124* 204* 185* 158*  BUN 21 19 20  26*  CREATININE 1.09 1.07 1.13 1.18  CALCIUM 9.1 9.3 9.3 8.9   Liver Function Tests:  Recent Labs Lab 10/28/13 1746 10/29/13 0600  AST 23 27  ALT 21 21  ALKPHOS 69 73  BILITOT 0.4 0.4  PROT 6.8 6.9  ALBUMIN 3.3* 3.3*   No results found for this basename: LIPASE, AMYLASE,  in the last 168 hours No results found for this basename: AMMONIA,  in the last 168 hours CBC:  Recent Labs Lab 10/28/13 1746 10/29/13 0600 10/30/13 0651  WBC 7.1 6.7 6.6  NEUTROABS 5.6 5.3  --   HGB 10.2* 10.5* 10.6*  HCT 35.1* 37.1* 38.4*  MCV 84.0 87.9 89.1  PLT 228 237  224   Cardiac Enzymes:    Recent Labs Lab 10/28/13 1746 10/29/13 1130 10/29/13 1516 10/29/13 2100  TROPONINI <0.30 <0.30 <0.30 <0.30   BNP (last 3 results)  Recent Labs  08/05/13 1403 10/28/13 1746  PROBNP 376.6* 325.7*   CBG:  Recent Labs Lab 10/31/13 1202 10/31/13 1651 10/31/13 2106 11/01/13 0749 11/01/13 1214  GLUCAP 135* 180* 195* 176* 161*    No results found for this or any previous visit (from the past 240 hour(s)).   Studies: No results found.  Scheduled Meds: . allopurinol  300 mg Oral Daily    . atorvastatin  20 mg Oral Daily  . azithromycin  500 mg Oral Daily  . diltiazem  300 mg Oral Daily  . furosemide  80 mg Intravenous BID  . insulin aspart  0-15 Units Subcutaneous TID WC  . insulin aspart  0-5 Units Subcutaneous QHS  . ketotifen  1 drop Both Eyes BID  . linagliptin  5 mg Oral Q supper   And  . metFORMIN  1,000 mg Oral BID WC  . lisinopril  20 mg Oral Daily  . pantoprazole  40 mg Oral Daily  . sodium chloride  3 mL Intravenous Q12H  . sodium chloride  3 mL Intravenous Q12H  . warfarin  10 mg Oral Once per day on Mon Fri  . [START ON 11/02/2013] warfarin  7.5 mg Oral Once per day on Sun Tue Wed Thu Sat  . Warfarin - Pharmacist Dosing Inpatient   Does not apply q1800    Continuous Infusions:    Time spent: 15 minutes  [place] [facility] Pager [phone]. If 7PM-7AM, please contact night-coverage at [email or web address], password [facility] 11/01/2013, 2:37 PM  LOS: 4 days

## 2013-11-01 NOTE — Progress Notes (Signed)
NT called RN to notify of oxygen sats of 89% on 3L. Patient attempting to sleep. RN woke patient and asked patient to sit up, cough and breathe deeply. Patient oxygen saturation improved to 93 % still on 3L. Patient states that he wears CPAP at home. Currently no orders for CPAP. On-call provider paged to notify, and request order. Will continue to monitor.

## 2013-11-01 NOTE — Progress Notes (Signed)
ANTICOAGULATION CONSULT NOTE - Follow Up Consult  Pharmacy Consult for Coumadin Indication: atrial fibrillation  Allergies  Allergen Reactions  . Codeine     Headache     Patient Measurements: Height: 4' 11"  (149.9 cm) Weight: 295 lb 13.7 oz (134.2 kg) IBW/kg (Calculated) : 47.7  Vital Signs: Temp: 98.2 F (36.8 C) (10/30 0600) Temp Source: Oral (10/30 0600) BP: 129/60 mmHg (10/30 0600) Pulse Rate: 108 (10/30 0600)  Labs:  Recent Labs  10/29/13 1130 10/29/13 1516 10/29/13 2100 10/30/13 0651 10/31/13 0645 11/01/13 0450  HGB  --   --   --  10.6*  --   --   HCT  --   --   --  38.4*  --   --   PLT  --   --   --  224  --   --   LABPROT  --   --   --  23.4* 23.7* 25.4*  INR  --   --   --  2.06* 2.09* 2.29*  CREATININE  --   --   --  1.13  --   --   TROPONINI <0.30 <0.30 <0.30  --   --   --     Estimated Creatinine Clearance: 79.9 ml/min (by C-G formula based on Cr of 1.13).  Assessment: 63 y/o male on chronic warfarin for Afib. INR is therapeutic No bleeding noted, CBC stable.  Home regimen: 7.5 mg daily, except 10 mg on Mondays and Fridays.    Goal of Therapy:  INR 2-3 Monitor platelets by anticoagulation protocol: Yes   Plan:  - Warfarin as per home regimen - INR daily   Thanks for allowing pharmacy to be a part of this patient's care.  Excell Seltzer, PharmD Clinical Pharmacist, [phone] 11/01/2013 10:41 AM

## 2013-11-01 NOTE — Progress Notes (Signed)
Patient Name: William Flores Date of Encounter: 11/01/2013     Active Problems:   A-fib   HTN (hypertension)   DM type 2, uncontrolled, with renal complications   Hyperlipidemia   Morbid obesity   OSA on CPAP   Acute respiratory failure with hypoxia   Acute bronchitis   Acute diastolic congestive heart failure    SUBJECTIVE  Denies any CP. SOB improved, however continue to have cough when lay down, cough improve through out the day after getting up  CURRENT MEDS . allopurinol  300 mg Oral Daily  . atorvastatin  20 mg Oral Daily  . azithromycin  500 mg Oral Daily  . diltiazem  300 mg Oral Daily  . furosemide  40 mg Intravenous 3 times per day  . insulin aspart  0-15 Units Subcutaneous TID WC  . insulin aspart  0-5 Units Subcutaneous QHS  . ketotifen  1 drop Both Eyes BID  . linagliptin  5 mg Oral Q supper   And  . metFORMIN  1,000 mg Oral BID WC  . lisinopril  20 mg Oral Daily  . pantoprazole  40 mg Oral Daily  . sodium chloride  3 mL Intravenous Q12H  . sodium chloride  3 mL Intravenous Q12H  . warfarin  10 mg Oral Once per day on Mon Fri  . [START ON 11/02/2013] warfarin  7.5 mg Oral Once per day on Sun Tue Wed Thu Sat  . Warfarin - Pharmacist Dosing Inpatient   Does not apply q1800    OBJECTIVE  Filed Vitals:   10/31/13 1333 10/31/13 2101 11/01/13 0500 11/01/13 0600  BP: 113/69 131/73  129/60  Pulse: 101 92  108  Temp: 98 F (36.7 C) 98 F (36.7 C)  98.2 F (36.8 C)  TempSrc: Oral Oral  Oral  Resp: 22 22  24   Height:      Weight:   295 lb 13.7 oz (134.2 kg)   SpO2: 83% 93%  94%    Intake/Output Summary (Last 24 hours) at 11/01/13 1213 Last data filed at 11/01/13 0900  Gross per 24 hour  Intake   1080 ml  Output   1350 ml  Net   -270 ml   Filed Weights   10/30/13 0300 10/30/13 1830 11/01/13 0500  Weight: 297 lb 13.5 oz (135.1 kg) 128 lb (58.06 kg) 295 lb 13.7 oz (134.2 kg)    PHYSICAL EXAM  General: Pleasant, NAD. Neuro: Alert and  oriented X 3. Moves all extremities spontaneously. Psych: Normal affect. HEENT:  Normal  Neck: Supple without bruits or JVD. Lungs:  Resp regular and unlabored, markedly decreased breath sound in bilateral bases and bibasilar rale Heart: irregular no s3, s4, or murmurs. Abdomen: Soft, non-tender, non-distended, BS + x 4.  Extremities: No clubbing, cyanosis. DP/PT/Radials 2+ and equal bilaterally. 1+ pitting edema in bilateral LE  Accessory Clinical Findings  CBC  Recent Labs  10/30/13 0651  WBC 6.6  HGB 10.6*  HCT 38.4*  MCV 89.1  PLT 364   Basic Metabolic Panel  Recent Labs  10/30/13 0651  NA 140  K 4.9  CL 99  CO2 31  GLUCOSE 185*  BUN 20  CREATININE 1.13  CALCIUM 9.3   Cardiac Enzymes  Recent Labs  10/29/13 1516 10/29/13 2100  TROPONINI <0.30 <0.30    TELE A-fib with HR 80-90s, occasional tachycardia up to 110s    ECG  No new EKG  Echocardiogram 08/07/2013  LV EF: 55% - 60%  -------------------------------------------------------------------  Indications: CHF - 428.0.  ------------------------------------------------------------------- History: PMH: Dyspnea. Atrial fibrillation. Risk factors: Pulmonary edema. Obstructive sleep apnea. Hypertension. Diabetes mellitus. Dyslipidemia.  ------------------------------------------------------------------- Study Conclusions  - Left ventricle: The cavity size was normal. Wall thickness was increased in a pattern of mild LVH. Systolic function was normal. The estimated ejection fraction was in the range of 55% to 60%. Indeterminant diastolic function (atrial fibrillation). Wall motion was normal; there were no regional wall motion abnormalities. - Aortic valve: Trileaflet; moderately calcified leaflets. Sclerosis without stenosis. - Mitral valve: Mildly to moderately calcified annulus. There was no significant regurgitation. - Left atrium: The atrium was mildly dilated. - Right ventricle: The cavity  size was mildly dilated. Systolic function was normal. - Pulmonary arteries: No complete TR doppler jet so unable to estimate PA systolic pressure. - Systemic veins: The IVC measured 2.1 cm with < 50% respirophasic variation, suggesting RA pressure 8 mmHg.  Impressions:  - The patient was in atrial fibrillation. Normal LV size with mild LV hypertrophy. EF 55-60%. Aortic sclerosis without stenosis. Mildly dilated RV with normal systolic function.      Radiology/Studies  Dg Chest 2 View  10/28/2013   CLINICAL DATA:  Shortness of breath, personal history of diabetes, hypertension, atrial fibrillation, carcinoma of the colon  EXAM: CHEST  2 VIEW  COMPARISON:  08/05/2013  FINDINGS: Enlargement of cardiac silhouette with pulmonary vascular congestion.  Mediastinal contours normal.  Minimal chronic peribronchial thickening.  Extra pleural density at the lateral aspects of the hemithoraces bilaterally appears stable question fat.  No acute infiltrate, pleural effusion or pneumothorax.  No acute osseous findings.  IMPRESSION: Enlargement of cardiac silhouette with pulmonary vascular congestion.  Mild chronic bronchitic changes.  No acute abnormalities.   Electronically Signed   By: Lavonia Dana M.D.   On: 10/28/2013 17:06   Dg Abd 1 View  10/29/2013   CLINICAL DATA:  Abdominal swelling  EXAM: ABDOMEN - 1 VIEW  COMPARISON:  05/27/2004  FINDINGS: The images are not marked supine or upright. Limited image quality due to large patient size  Normal bowel gas pattern.  No acute abnormality identified.  IMPRESSION: Negative   Electronically Signed   By: Franchot Gallo M.D.   On: 10/29/2013 08:30    ASSESSMENT AND PLAN  William Flores is a 63 year old Caucasian male with PMH of atrial fibrillation (on chronic anticoagulation), chronic diastolic HF (EF 41-32% on Echo 08/2013), Type II Diabetes Mellitus, HTN, and HLD presenting to [facility] ED on 10/28/2013 with acute resp failure with hypoxia in the  setting of acute on chronic diastolic heart failure.   1 . Acute on Chronic Diastolic CHF:   - Echocardiogram on 08/07/2013 showed EF of 55-60%. BNP on 10/28/2013 was 325.7.   - Net Output since admission is 2.8L. Dry weight for patient is 290lbs and was 297 on 10/30/2013. Second recording of the day inaccurrate.   - IV Lasix dose increased to 40m TID yesterday, however did not appreciate significant urine output, continue to have bibasilar rale and 1+ pitting edema, Cr stable,?if should increase lasix to 848mBID for 1 day. Will discuss with Dr. JoMartinique2. Atrial Fibrillation:   - cardizem dose increased from 240 to 300 yesterday, HR better controlled  - Continue Warfarin for anticoagulation. INR is therapeutic at 2.09 on 10/31/2013.     SiHilbert CorriganA-C Pager: [phone] seen and examined and history reviewed. Agree with above findings and plan. Feeling better with less dyspnea. Still edematous.  Weight up from baseline. Lungs with basilar rales. afib rate is well controlled. Will change lasix to 80 mg bid to try and affect a better diuresis.  Peter Martinique, [place] 11/01/2013 1:27 PM

## 2013-11-02 DIAGNOSIS — E785 Hyperlipidemia, unspecified: Secondary | ICD-10-CM

## 2013-11-02 LAB — BASIC METABOLIC PANEL
Anion gap: 11 (ref 5–15)
BUN: 31 mg/dL — AB (ref 6–23)
CO2: 30 mEq/L (ref 19–32)
CREATININE: 1.29 mg/dL (ref 0.50–1.35)
Calcium: 9.1 mg/dL (ref 8.4–10.5)
Chloride: 99 mEq/L (ref 96–112)
GFR calc non Af Amer: 58 mL/min — ABNORMAL LOW (ref >90)
GFR, EST AFRICAN AMERICAN: 68 mL/min — AB (ref >90)
Glucose, Bld: 135 mg/dL — ABNORMAL HIGH (ref 70–99)
Potassium: 5.1 mEq/L (ref 3.7–5.3)
Sodium: 140 mEq/L (ref 137–147)

## 2013-11-02 LAB — EXPECTORATED SPUTUM ASSESSMENT W REFEX TO RESP CULTURE

## 2013-11-02 LAB — GLUCOSE, CAPILLARY
GLUCOSE-CAPILLARY: 129 mg/dL — AB (ref 70–99)
Glucose-Capillary: 156 mg/dL — ABNORMAL HIGH (ref 70–99)
Glucose-Capillary: 169 mg/dL — ABNORMAL HIGH (ref 70–99)
Glucose-Capillary: 138 mg/dL — ABNORMAL HIGH (ref 70–99)

## 2013-11-02 LAB — PROTIME-INR
INR: 2.51 — AB (ref 0.00–1.49)
PROTHROMBIN TIME: 27.3 s — AB (ref 11.6–15.2)

## 2013-11-02 MED ORDER — METOLAZONE 2.5 MG PO TABS
2.5000 mg | ORAL_TABLET | Freq: Every day | ORAL | Status: DC
  Administered 2013-11-02 – 2013-11-05 (×4): 2.5 mg via ORAL
  Filled 2013-11-02 (×4): qty 1

## 2013-11-02 NOTE — Progress Notes (Signed)
Patient ID: [name], male   DOB: 1950-05-15, 63 y.o.   MRN: [id number]   Patient Name: William Flores Date of Encounter: 11/02/2013     Active Problems:   A-fib   HTN (hypertension)   DM type 2, uncontrolled, with renal complications   Hyperlipidemia   Morbid obesity   OSA on CPAP   Acute respiratory failure with hypoxia   Acute bronchitis   Acute diastolic congestive heart failure    SUBJECTIVE  Denies any CP. SOB improved, however continue to have cough when lay down, cough improve through out the day after getting up, not yet ambulating  CURRENT MEDS . allopurinol  300 mg Oral Daily  . atorvastatin  20 mg Oral Daily  . azithromycin  500 mg Oral Daily  . diltiazem  300 mg Oral Daily  . furosemide  80 mg Intravenous BID  . insulin aspart  0-15 Units Subcutaneous TID WC  . insulin aspart  0-5 Units Subcutaneous QHS  . ketotifen  1 drop Both Eyes BID  . linagliptin  5 mg Oral Q supper   And  . metFORMIN  1,000 mg Oral BID WC  . lisinopril  20 mg Oral Daily  . pantoprazole  40 mg Oral Daily  . sodium chloride  3 mL Intravenous Q12H  . sodium chloride  3 mL Intravenous Q12H  . warfarin  10 mg Oral Once per day on Mon Fri  . warfarin  7.5 mg Oral Once per day on Sun Tue Wed Thu Sat  . Warfarin - Pharmacist Dosing Inpatient   Does not apply q1800    OBJECTIVE  Filed Vitals:   11/01/13 2053 11/01/13 2119 11/01/13 2200 11/02/13 0602  BP: 132/58   118/61  Pulse: 79  84 89  Temp: 98.6 F (37 C)   98.2 F (36.8 C)  TempSrc: Oral   Oral  Resp: 20  20 20   Height:      Weight:    294 lb 15.6 oz (133.8 kg)  SpO2: 89% 94% 92% 95%    Intake/Output Summary (Last 24 hours) at 11/02/13 0944 Last data filed at 11/02/13 0804  Gross per 24 hour  Intake    850 ml  Output   1150 ml  Net   -300 ml   Filed Weights   10/30/13 1830 11/01/13 0500 11/02/13 0602  Weight: 128 lb (58.06 kg) 295 lb 13.7 oz (134.2 kg) 294 lb 15.6 oz (133.8 kg)    PHYSICAL EXAM  General:  Pleasant, NAD. Neuro: Alert and oriented X 3. Moves all extremities spontaneously. Psych: Normal affect. HEENT:  Normal  Neck: Supple without bruits or JVD. Lungs:  Resp regular and unlabored, markedly decreased breath sound in bilateral bases and bibasilar rale Heart: irregular no s3, s4, or murmurs. Abdomen: Soft, non-tender, non-distended, BS + x 4.  Extremities: No clubbing, cyanosis. DP/PT/Radials 2+ and equal bilaterally. 2+ pitting edema in bilateral LE  Accessory Clinical Findings  CBC No results found for this basename: WBC, NEUTROABS, HGB, HCT, MCV, PLT,  in the last 72 hours Basic Metabolic Panel  Recent Labs  11/01/13 1116 11/02/13 0554  NA 140 140  K 4.9 5.1  CL 97 99  CO2 35* 30  GLUCOSE 158* 135*  BUN 26* 31*  CREATININE 1.18 1.29  CALCIUM 8.9 9.1   Cardiac Enzymes No results found for this basename: CKTOTAL, CKMB, CKMBINDEX, TROPONINI,  in the last 72 hours  TELE A-fib with HR 80-90s, occasional tachycardia up to  110s     Echocardiogram 08/07/2013  LV EF: 55% - 60%  ------------------------------------------------------------------- Indications: CHF - 428.0.  ------------------------------------------------------------------- History: PMH: Dyspnea. Atrial fibrillation. Risk factors: Pulmonary edema. Obstructive sleep apnea. Hypertension. Diabetes mellitus. Dyslipidemia.  ------------------------------------------------------------------- Study Conclusions  - Left ventricle: The cavity size was normal. Wall thickness was increased in a pattern of mild LVH. Systolic function was normal. The estimated ejection fraction was in the range of 55% to 60%. Indeterminant diastolic function (atrial fibrillation). Wall motion was normal; there were no regional wall motion abnormalities. - Aortic valve: Trileaflet; moderately calcified leaflets. Sclerosis without stenosis. - Mitral valve: Mildly to moderately calcified annulus. There was no significant  regurgitation. - Left atrium: The atrium was mildly dilated. - Right ventricle: The cavity size was mildly dilated. Systolic function was normal. - Pulmonary arteries: No complete TR doppler jet so unable to estimate PA systolic pressure. - Systemic veins: The IVC measured 2.1 cm with < 50% respirophasic variation, suggesting RA pressure 8 mmHg.  Impressions:  - The patient was in atrial fibrillation. Normal LV size with mild LV hypertrophy. EF 55-60%. Aortic sclerosis without stenosis. Mildly dilated RV with normal systolic function.      Radiology/Studies  Dg Chest 2 View  10/28/2013   CLINICAL DATA:  Shortness of breath, personal history of diabetes, hypertension, atrial fibrillation, carcinoma of the colon  EXAM: CHEST  2 VIEW  COMPARISON:  08/05/2013  FINDINGS: Enlargement of cardiac silhouette with pulmonary vascular congestion.  Mediastinal contours normal.  Minimal chronic peribronchial thickening.  Extra pleural density at the lateral aspects of the hemithoraces bilaterally appears stable question fat.  No acute infiltrate, pleural effusion or pneumothorax.  No acute osseous findings.  IMPRESSION: Enlargement of cardiac silhouette with pulmonary vascular congestion.  Mild chronic bronchitic changes.  No acute abnormalities.   Electronically Signed   By: Lavonia Dana M.D.   On: 10/28/2013 17:06   Dg Abd 1 View  10/29/2013   CLINICAL DATA:  Abdominal swelling  EXAM: ABDOMEN - 1 VIEW  COMPARISON:  05/27/2004  FINDINGS: The images are not marked supine or upright. Limited image quality due to large patient size  Normal bowel gas pattern.  No acute abnormality identified.  IMPRESSION: Negative   Electronically Signed   By: Franchot Gallo M.D.   On: 10/29/2013 08:30    ASSESSMENT AND PLAN  William Flores is a 63 year old Caucasian male with PMH of atrial fibrillation (on chronic anticoagulation), chronic diastolic HF (EF 92-11% on Echo 08/2013), Type II Diabetes Mellitus, HTN, and HLD  presenting to [facility] ED on 10/28/2013 with acute resp failure with hypoxia in the setting of acute on chronic diastolic heart failure.   1 . Acute on Chronic Diastolic CHF:   - Echocardiogram on 08/07/2013 showed EF of 55-60%. BNP on 10/28/2013 was 325.7.   - Net Output since admission is 2.8L. Dry weight for patient is 290lbs and was 297 on 10/30/2013. Second recording of the day inaccurrate.   - IV Lasix dose increased to 76m BID yesterday, however did not appreciate significant urine output, continue to have bibasilar rale and 2+ pitting edema, Cr stable, will add metolazone and follow electrolytes.  2. Atrial Fibrillation:   - cardizem dose increased from 240 to 300 yesterday, HR better controlled  - Continue Warfarin for anticoagulation. INR is therapeutic at 2.09 on 10/31/2013.     SLoma Messing MEmerson10/31/2015 9:44 AM

## 2013-11-02 NOTE — Progress Notes (Signed)
PROGRESS NOTE  William Flores KZS:[id number] DOB: 1950/07/19 DOA: 10/28/2013 PCP: Redge Gainer, MD  HPI/Recap of past 36 hours: 63 year old male with past medical history of atrial fibrillation on chronic Coumadin, diastolic heart failure and diabetes mellitus presented to the emergency room on 10/26 with progressively worsening shortness of breath for the last month, productive cough and 10 pound weight gain. Patient was admitted to the hospitalist service for bronchitis plus acute on chronic diastolic heart failure.  Cardiology consultation and patient has been treated with IV Lasix plus antibiotics.  Patient still about 5 pounds from dry weight and attempts to increase Lasix to 3 times a day did not yield much. This was decreased back to twice a day yesterday and today but has alone added by cardiology. Patient himself states that he is feeling a bit better, although he still has a cough, especially when he first wakes up.  Assessment/Plan: Active Problems:   A-fib: Cardiology seeing. Better controlled rate with increase in Cardizem. INR therapeutic.   HTN (hypertension): Blood pressure is well controlled    DM type 2, uncontrolled, with renal complications: Last D3U at 7.9. Currently on sliding scale. CBG staying under 200    Hyperlipidemia: Continue statin.    Morbid obesity: Patient meets criteria with BMI greater than 40.    OSA: Home Pap started nightly   Acute respiratory failure with hypoxia: Much improving.   Acute bronchitis: Completed Zithromax 10/31   acute on chronic diastolic heart failure: Appreciate cardiology help. Lasix at twice a day plus [place] now  Code Status: Full code  Family Communication: Left message with family  Disposition Plan: Home once fully diuresed   Consultants:  Cardiology  Procedures:  None  Antibiotics:  Zithromax 10/26-10/31   Objective: BP 114/69  Pulse 84  Temp(Src) 98.9 F (37.2 C) (Oral)  Resp 18  Ht 4' 11"  (1.499  m)  Wt 133.8 kg (294 lb 15.6 oz)  BMI 59.55 kg/m2  SpO2 95%  Intake/Output Summary (Last 24 hours) at 11/02/13 1358 Last data filed at 11/02/13 1348  Gross per 24 hour  Intake    610 ml  Output   1850 ml  Net  -1240 ml   Filed Weights   10/30/13 1830 11/01/13 0500 11/02/13 0602  Weight: 58.06 kg (128 lb) 134.2 kg (295 lb 13.7 oz) 133.8 kg (294 lb 15.6 oz)    Exam:   General: Alert and oriented, no acute distress  Cardiovascular: Irregular rhythm  Respiratory: Decreased breath sounds throughout-no crackles  Abdomen: Soft, obese, nontender, positive bowel sounds  Musculoskeletal: No clubbing or cyanosis, 1+ pitting edema bilaterally   Data Reviewed: Basic Metabolic Panel:  Recent Labs Lab 10/28/13 1746 10/29/13 0600 10/30/13 0651 11/01/13 1116 11/02/13 0554  NA 144 143 140 140 140  K 4.3 4.6 4.9 4.9 5.1  CL 104 102 99 97 99  CO2 29 32 31 35* 30  GLUCOSE 124* 204* 185* 158* 135*  BUN 21 19 20  26* 31*  CREATININE 1.09 1.07 1.13 1.18 1.29  CALCIUM 9.1 9.3 9.3 8.9 9.1   Liver Function Tests:  Recent Labs Lab 10/28/13 1746 10/29/13 0600  AST 23 27  ALT 21 21  ALKPHOS 69 73  BILITOT 0.4 0.4  PROT 6.8 6.9  ALBUMIN 3.3* 3.3*   No results found for this basename: LIPASE, AMYLASE,  in the last 168 hours No results found for this basename: AMMONIA,  in the last 168 hours CBC:  Recent Labs Lab 10/28/13 1746 10/29/13  0600 10/30/13 0651  WBC 7.1 6.7 6.6  NEUTROABS 5.6 5.3  --   HGB 10.2* 10.5* 10.6*  HCT 35.1* 37.1* 38.4*  MCV 84.0 87.9 89.1  PLT 228 237 224   Cardiac Enzymes:    Recent Labs Lab 10/28/13 1746 10/29/13 1130 10/29/13 1516 10/29/13 2100  TROPONINI <0.30 <0.30 <0.30 <0.30   BNP (last 3 results)  Recent Labs  08/05/13 1403 10/28/13 1746  PROBNP 376.6* 325.7*   CBG:  Recent Labs Lab 11/01/13 1214 11/01/13 1707 11/01/13 2052 11/02/13 0804 11/02/13 1226  GLUCAP 161* 130* 170* 129* 156*    Recent Results (from the  past 240 hour(s))  CULTURE, EXPECTORATED SPUTUM-ASSESSMENT     Status: None   Collection Time    11/01/13  6:56 PM      Result Value Ref Range Status   Specimen Description SPUTUM   Final   Special Requests NONE   Final   Sputum evaluation     Final   Value: THIS SPECIMEN IS ACCEPTABLE. RESPIRATORY CULTURE REPORT TO FOLLOW.   Report Status 11/02/2013 FINAL   Final  CULTURE, RESPIRATORY (NON-EXPECTORATED)     Status: None   Collection Time    11/01/13  6:56 PM      Result Value Ref Range Status   Specimen Description SPUTUM   Final   Special Requests NONE   Final   Gram Stain     Final   Value: RARE WBC PRESENT, PREDOMINANTLY MONONUCLEAR     RARE SQUAMOUS EPITHELIAL CELLS PRESENT     FEW GRAM POSITIVE COCCI     IN PAIRS IN CLUSTERS RARE GRAM NEGATIVE RODS     Performed at [facility]   Culture PENDING   Incomplete   Report Status PENDING   Incomplete     Studies: No results found.  Scheduled Meds: . allopurinol  300 mg Oral Daily  . atorvastatin  20 mg Oral Daily  . diltiazem  300 mg Oral Daily  . furosemide  80 mg Intravenous BID  . insulin aspart  0-15 Units Subcutaneous TID WC  . insulin aspart  0-5 Units Subcutaneous QHS  . ketotifen  1 drop Both Eyes BID  . linagliptin  5 mg Oral Q supper   And  . metFORMIN  1,000 mg Oral BID WC  . lisinopril  20 mg Oral Daily  . metolazone  2.5 mg Oral Daily  . pantoprazole  40 mg Oral Daily  . sodium chloride  3 mL Intravenous Q12H  . sodium chloride  3 mL Intravenous Q12H  . warfarin  10 mg Oral Once per day on Mon Fri  . warfarin  7.5 mg Oral Once per day on Sun Tue Wed Thu Sat  . Warfarin - Pharmacist Dosing Inpatient   Does not apply q1800    Continuous Infusions:    Time spent: 15 minutes  [facility] Pager [phone]. If 7PM-7AM, please contact night-coverage at [email or web address], password [facility] 11/02/2013, 1:58 PM  LOS: 5 days

## 2013-11-02 NOTE — Progress Notes (Signed)
ANTICOAGULATION CONSULT NOTE - Follow Up Consult  Pharmacy Consult for Coumadin Indication: atrial fibrillation  Allergies  Allergen Reactions  . Codeine     Headache     Patient Measurements: Height: 4' 11"  (149.9 cm) Weight: 294 lb 15.6 oz (133.8 kg) IBW/kg (Calculated) : 47.7  Vital Signs: Temp: 98.2 F (36.8 C) (10/31 0602) Temp Source: Oral (10/31 0602) BP: 118/61 mmHg (10/31 0602) Pulse Rate: 89 (10/31 0602)  Labs:  Recent Labs  10/31/13 0645 11/01/13 0450 11/01/13 1116 11/02/13 0554  LABPROT 23.7* 25.4*  --  27.3*  INR 2.09* 2.29*  --  2.51*  CREATININE  --   --  1.18 1.29    Estimated Creatinine Clearance: 69.8 ml/min (by C-G formula based on Cr of 1.29).  Assessment: 63 y/o male on chronic warfarin for Afib. INR is therapeutic. No bleeding noted, CBC stable.  Home regimen: 7.5 mg daily, except 10 mg on Mondays and Fridays.    Goal of Therapy:  INR 2-3 Monitor platelets by anticoagulation protocol: Yes   Plan:  - Warfarin as per home regimen: 7.39m x1 today - INR daily - Monitor s/sx of bleeding  Megan E. Supple, Pharm.D Clinical Pharmacy Resident Pager: [phone]/31/2015 12:45 PM

## 2013-11-03 LAB — GLUCOSE, CAPILLARY
GLUCOSE-CAPILLARY: 126 mg/dL — AB (ref 70–99)
GLUCOSE-CAPILLARY: 159 mg/dL — AB (ref 70–99)
Glucose-Capillary: 139 mg/dL — ABNORMAL HIGH (ref 70–99)
Glucose-Capillary: 140 mg/dL — ABNORMAL HIGH (ref 70–99)

## 2013-11-03 LAB — CBC
HEMATOCRIT: 35.4 % — AB (ref 39.0–52.0)
Hemoglobin: 10 g/dL — ABNORMAL LOW (ref 13.0–17.0)
MCH: 24.3 pg — ABNORMAL LOW (ref 26.0–34.0)
MCHC: 28.2 g/dL — ABNORMAL LOW (ref 30.0–36.0)
MCV: 86.1 fL (ref 78.0–100.0)
Platelets: 212 10*3/uL (ref 150–400)
RBC: 4.11 MIL/uL — ABNORMAL LOW (ref 4.22–5.81)
RDW: 20.5 % — AB (ref 11.5–15.5)
WBC: 6.8 10*3/uL (ref 4.0–10.5)

## 2013-11-03 LAB — BASIC METABOLIC PANEL
ANION GAP: 10 (ref 5–15)
Anion gap: 10 (ref 5–15)
BUN: 30 mg/dL — ABNORMAL HIGH (ref 6–23)
BUN: 33 mg/dL — ABNORMAL HIGH (ref 6–23)
CALCIUM: 9.2 mg/dL (ref 8.4–10.5)
CALCIUM: 9.4 mg/dL (ref 8.4–10.5)
CO2: 34 mEq/L — ABNORMAL HIGH (ref 19–32)
CO2: 35 mEq/L — ABNORMAL HIGH (ref 19–32)
Chloride: 94 mEq/L — ABNORMAL LOW (ref 96–112)
Chloride: 95 mEq/L — ABNORMAL LOW (ref 96–112)
Creatinine, Ser: 1.27 mg/dL (ref 0.50–1.35)
Creatinine, Ser: 1.48 mg/dL — ABNORMAL HIGH (ref 0.50–1.35)
GFR calc Af Amer: 57 mL/min — ABNORMAL LOW (ref >90)
GFR calc Af Amer: 69 mL/min — ABNORMAL LOW (ref >90)
GFR calc non Af Amer: 49 mL/min — ABNORMAL LOW (ref >90)
GFR calc non Af Amer: 59 mL/min — ABNORMAL LOW (ref >90)
GLUCOSE: 123 mg/dL — AB (ref 70–99)
Glucose, Bld: 135 mg/dL — ABNORMAL HIGH (ref 70–99)
Potassium: 4.4 mEq/L (ref 3.7–5.3)
Potassium: 4.7 mEq/L (ref 3.7–5.3)
Sodium: 138 mEq/L (ref 137–147)
Sodium: 140 mEq/L (ref 137–147)

## 2013-11-03 LAB — PROTIME-INR
INR: 2.6 — ABNORMAL HIGH (ref 0.00–1.49)
Prothrombin Time: 28.1 seconds — ABNORMAL HIGH (ref 11.6–15.2)

## 2013-11-03 MED ORDER — DILTIAZEM HCL ER COATED BEADS 360 MG PO CP24
360.0000 mg | ORAL_CAPSULE | Freq: Every day | ORAL | Status: DC
  Administered 2013-11-04 – 2013-11-05 (×2): 360 mg via ORAL
  Filled 2013-11-03 (×2): qty 1

## 2013-11-03 NOTE — Progress Notes (Signed)
ANTICOAGULATION CONSULT NOTE - Follow Up Consult  Pharmacy Consult for Coumadin Indication: atrial fibrillation  Allergies  Allergen Reactions  . Codeine     Headache     Patient Measurements: Height: 4' 11"  (149.9 cm) Weight: 293 lb 3.4 oz (133 kg) IBW/kg (Calculated) : 47.7  Vital Signs: Temp: 99.1 F (37.3 C) (11/01 0545) Temp Source: Oral (11/01 0545) BP: 158/92 mmHg (11/01 0545) Pulse Rate: 102 (11/01 0545)  Labs:  Recent Labs  11/01/13 0450  11/02/13 0554 11/03/13 11/03/13 0535  HGB  --   --   --   --  10.0*  HCT  --   --   --   --  35.4*  PLT  --   --   --   --  212  LABPROT 25.4*  --  27.3*  --  28.1*  INR 2.29*  --  2.51*  --  2.60*  CREATININE  --   < > 1.29 1.48* 1.27  < > = values in this interval not displayed.  Estimated Creatinine Clearance: 70.7 mL/min (by C-G formula based on Cr of 1.27).  Assessment: 63 y/o male on chronic warfarin for Afib. INR is therapeutic at 2.6 today. No bleeding noted, CBC stable.  Home regimen: 7.5 mg daily, except 10 mg on Mondays and Fridays.    Goal of Therapy:  INR 2-3 Monitor platelets by anticoagulation protocol: Yes   Plan:  - Warfarin as per home regimen: 7.32m today - INR daily - Monitor s/sx of bleeding  Harley Fitzwater E. Laila Myhre, Pharm.D Clinical Pharmacy Resident Pager: [phone]/01/2013 1:22 PM

## 2013-11-03 NOTE — Progress Notes (Addendum)
PROGRESS NOTE  William Flores ZTI:[id number] DOB: [birth year] DOA: 10/28/2013 PCP: Redge Gainer, MD  HPI/Recap of past 39 hours: 63 year old male with past medical history of atrial fibrillation on chronic Coumadin, diastolic heart failure and diabetes mellitus presented to the emergency room on 10/26 with progressively worsening shortness of breath for the last month, productive cough and 10 pound weight gain. Patient was admitted to the hospitalist service for bronchitis plus acute on chronic diastolic heart failure.  Cardiology consultation and patient has been treated with IV Lasix plus antibiotics.  Metolazone added yesterday and patient has responded well with further diuresis:is now down 6 L and is down to 293-goal weight of 290.patient feeling good, breathing easier, slept better with see Pap  Assessment/Plan: Active Problems:   A-fib: Cardiology seeing. Still some episodes of heart rate greater than 100, increased Cardizem to 360 starting 11/2    HTN (hypertension): Blood pressure is well controlled    DM type 2, uncontrolled, with renal complications: Last A2N at 7.9. Currently on sliding scale. CBG staying under 200    Hyperlipidemia: Continue statin.    Morbid obesity: Patient meets criteria with BMI greater than 40.    OSA: slept well after starting C Pap   Acute respiratory failure with hypoxia: Much improving.   Acute bronchitis: Completed Zithromax 10/31   acute on chronic diastolic heart failure: Appreciate cardiology help. Lasix at twice a day plus metolazone.  Code Status: Full code  Family Communication: Left message with family  Disposition Plan: almost a dry weight, likely discharge hopefully tomorrow or Tuesday   Consultants:  Cardiology  Procedures:  None  Antibiotics:  Zithromax 10/26-10/31   Objective: BP 158/92 mmHg  Pulse 102  Temp(Src) 99.1 F (37.3 C) (Oral)  Resp 18  Ht 4' 11"  (1.499 m)  Wt 133 kg (293 lb 3.4 oz)  BMI 59.19  kg/m2  SpO2 96%  Intake/Output Summary (Last 24 hours) at 11/03/13 1340 Last data filed at 11/03/13 1230  Gross per 24 hour  Intake    720 ml  Output   4300 ml  Net  -3580 ml   Filed Weights   11/01/13 0500 11/02/13 0602 11/03/13 0545  Weight: 134.2 kg (295 lb 13.7 oz) 133.8 kg (294 lb 15.6 oz) 133 kg (293 lb 3.4 oz)    Exam:   General: Alert and oriented, no acute distress  Cardiovascular: Irregular rhythm, rate controlled  Respiratory: Decreased breath sounds throughout-no crackles  Abdomen: Soft, obese, nontender, positive bowel sounds  Musculoskeletal: No clubbing or cyanosis, 1+ pitting edema bilaterally   Data Reviewed: Basic Metabolic Panel:  Recent Labs Lab 10/30/13 0651 11/01/13 1116 11/02/13 0554 11/03/13 11/03/13 0535  NA 140 140 140 138 140  K 4.9 4.9 5.1 4.7 4.4  CL 99 97 99 94* 95*  CO2 31 35* 30 34* 35*  GLUCOSE 185* 158* 135* 135* 123*  BUN 20 26* 31* 33* 30*  CREATININE 1.13 1.18 1.29 1.48* 1.27  CALCIUM 9.3 8.9 9.1 9.2 9.4   Liver Function Tests:  Recent Labs Lab 10/28/13 1746 10/29/13 0600  AST 23 27  ALT 21 21  ALKPHOS 69 73  BILITOT 0.4 0.4  PROT 6.8 6.9  ALBUMIN 3.3* 3.3*   No results for input(s): LIPASE, AMYLASE in the last 168 hours. No results for input(s): AMMONIA in the last 168 hours. CBC:  Recent Labs Lab 10/28/13 1746 10/29/13 0600 10/30/13 0651 11/03/13 0535  WBC 7.1 6.7 6.6 6.8  NEUTROABS 5.6 5.3  --   --  HGB 10.2* 10.5* 10.6* 10.0*  HCT 35.1* 37.1* 38.4* 35.4*  MCV 84.0 87.9 89.1 86.1  PLT 228 237 224 212   Cardiac Enzymes:    Recent Labs Lab 10/28/13 1746 10/29/13 1130 10/29/13 1516 10/29/13 2100  TROPONINI <0.30 <0.30 <0.30 <0.30   BNP (last 3 results)  Recent Labs  08/05/13 1403 10/28/13 1746  PROBNP 376.6* 325.7*   CBG:  Recent Labs Lab 11/02/13 1226 11/02/13 1720 11/02/13 2217 11/03/13 0743 11/03/13 1224  GLUCAP 156* 169* 138* 126* 139*    Recent Results (from the past  240 hour(s))  Culture, expectorated sputum-assessment     Status: None   Collection Time: 11/01/13  6:56 PM  Result Value Ref Range Status   Specimen Description SPUTUM  Final   Special Requests NONE  Final   Sputum evaluation   Final    THIS SPECIMEN IS ACCEPTABLE. RESPIRATORY CULTURE REPORT TO FOLLOW.   Report Status 11/02/2013 FINAL  Final  Culture, respiratory (NON-Expectorated)     Status: None (Preliminary result)   Collection Time: 11/01/13  6:56 PM  Result Value Ref Range Status   Specimen Description SPUTUM  Final   Special Requests NONE  Final   Gram Stain   Final    RARE WBC PRESENT, PREDOMINANTLY MONONUCLEAR RARE SQUAMOUS EPITHELIAL CELLS PRESENT FEW GRAM POSITIVE COCCI IN PAIRS IN CLUSTERS RARE GRAM NEGATIVE RODS Performed at [facility]    Culture   Final    NORMAL OROPHARYNGEAL FLORA Performed at [facility]    Report Status PENDING  Incomplete     Studies: No results found.  Scheduled Meds: . allopurinol  300 mg Oral Daily  . atorvastatin  20 mg Oral Daily  . [START ON 11/04/2013] diltiazem  360 mg Oral Daily  . furosemide  80 mg Intravenous BID  . insulin aspart  0-15 Units Subcutaneous TID WC  . insulin aspart  0-5 Units Subcutaneous QHS  . ketotifen  1 drop Both Eyes BID  . linagliptin  5 mg Oral Q supper   And  . metFORMIN  1,000 mg Oral BID WC  . lisinopril  20 mg Oral Daily  . metolazone  2.5 mg Oral Daily  . pantoprazole  40 mg Oral Daily  . sodium chloride  3 mL Intravenous Q12H  . sodium chloride  3 mL Intravenous Q12H  . warfarin  10 mg Oral Once per day on Mon Fri  . warfarin  7.5 mg Oral Once per day on Sun Tue Wed Thu Sat  . Warfarin - Pharmacist Dosing Inpatient   Does not apply q1800    Continuous Infusions:    Time spent: 15 minutes  [place] [facility] Pager [phone]. If 7PM-7AM, please contact night-coverage at [email or web address], password [facility] 11/03/2013, 1:40 PM  LOS: 6 days

## 2013-11-03 NOTE — Plan of Care (Signed)
Problem: Phase I Progression Outcomes Goal: O2 sats > or equal 90% or at baseline Outcome: Completed/Met Date Met:  11/03/13 Goal: Progress activity as tolerated unless otherwise ordered Outcome: Completed/Met Date Met:  11/03/13  Problem: Discharge Progression Outcomes Goal: Pain controlled with appropriate interventions Outcome: Completed/Met Date Met:  29/04/75 Goal: Complications resolved/controlled Outcome: Not Applicable Date Met:  33/91/79

## 2013-11-04 DIAGNOSIS — I5023 Acute on chronic systolic (congestive) heart failure: Secondary | ICD-10-CM | POA: Insufficient documentation

## 2013-11-04 DIAGNOSIS — J209 Acute bronchitis, unspecified: Secondary | ICD-10-CM

## 2013-11-04 DIAGNOSIS — G4733 Obstructive sleep apnea (adult) (pediatric): Secondary | ICD-10-CM

## 2013-11-04 DIAGNOSIS — E1165 Type 2 diabetes mellitus with hyperglycemia: Secondary | ICD-10-CM

## 2013-11-04 DIAGNOSIS — I4819 Other persistent atrial fibrillation: Secondary | ICD-10-CM | POA: Insufficient documentation

## 2013-11-04 DIAGNOSIS — I10 Essential (primary) hypertension: Secondary | ICD-10-CM | POA: Insufficient documentation

## 2013-11-04 DIAGNOSIS — E1129 Type 2 diabetes mellitus with other diabetic kidney complication: Secondary | ICD-10-CM

## 2013-11-04 LAB — GLUCOSE, CAPILLARY
Glucose-Capillary: 142 mg/dL — ABNORMAL HIGH (ref 70–99)
Glucose-Capillary: 152 mg/dL — ABNORMAL HIGH (ref 70–99)
Glucose-Capillary: 163 mg/dL — ABNORMAL HIGH (ref 70–99)
Glucose-Capillary: 169 mg/dL — ABNORMAL HIGH (ref 70–99)

## 2013-11-04 LAB — CULTURE, RESPIRATORY W GRAM STAIN

## 2013-11-04 LAB — BASIC METABOLIC PANEL
ANION GAP: 9 (ref 5–15)
BUN: 31 mg/dL — ABNORMAL HIGH (ref 6–23)
CALCIUM: 9.6 mg/dL (ref 8.4–10.5)
CO2: 39 meq/L — AB (ref 19–32)
Chloride: 96 mEq/L (ref 96–112)
Creatinine, Ser: 1.39 mg/dL — ABNORMAL HIGH (ref 0.50–1.35)
GFR calc non Af Amer: 53 mL/min — ABNORMAL LOW (ref >90)
GFR, EST AFRICAN AMERICAN: 62 mL/min — AB (ref >90)
Glucose, Bld: 151 mg/dL — ABNORMAL HIGH (ref 70–99)
Potassium: 4.4 mEq/L (ref 3.7–5.3)
SODIUM: 144 meq/L (ref 137–147)

## 2013-11-04 LAB — CULTURE, RESPIRATORY: CULTURE: NORMAL

## 2013-11-04 LAB — PROTIME-INR
INR: 2.17 — AB (ref 0.00–1.49)
PROTHROMBIN TIME: 24.4 s — AB (ref 11.6–15.2)

## 2013-11-04 MED ORDER — BISOPROLOL FUMARATE 5 MG PO TABS
2.5000 mg | ORAL_TABLET | Freq: Every day | ORAL | Status: DC
  Administered 2013-11-04 – 2013-11-05 (×2): 2.5 mg via ORAL
  Filled 2013-11-04 (×2): qty 0.5

## 2013-11-04 MED ORDER — FUROSEMIDE 80 MG PO TABS
80.0000 mg | ORAL_TABLET | Freq: Two times a day (BID) | ORAL | Status: DC
  Administered 2013-11-04 – 2013-11-05 (×2): 80 mg via ORAL
  Filled 2013-11-04 (×4): qty 1

## 2013-11-04 NOTE — Progress Notes (Signed)
Patient stated he did not want to wear CPAP tonight.  Patient is aware to have RN call Respiratory if he changes his mind.

## 2013-11-04 NOTE — Progress Notes (Signed)
ANTICOAGULATION CONSULT NOTE - Follow Up Consult  Pharmacy Consult for Coumadin Indication: atrial fibrillation  Allergies  Allergen Reactions  . Codeine     Headache     Patient Measurements: Height: 4' 11"  (149.9 cm) Weight: 293 lb 3.4 oz (133 kg) IBW/kg (Calculated) : 47.7  Vital Signs: Temp: 97.7 F (36.5 C) (11/02 0526) Temp Source: Oral (11/02 0526) BP: 139/84 mmHg (11/02 0917) Pulse Rate: 101 (11/02 0526)  Labs:  Recent Labs  11/02/13 0554 11/03/13 11/03/13 0535 11/04/13 0500  HGB  --   --  10.0*  --   HCT  --   --  35.4*  --   PLT  --   --  212  --   LABPROT 27.3*  --  28.1* 24.4*  INR 2.51*  --  2.60* 2.17*  CREATININE 1.29 1.48* 1.27 1.39*    Estimated Creatinine Clearance: 64.6 mL/min (by C-G formula based on Cr of 1.39).  Assessment:   INR remains therapeutic (2.17) on home Coumadin regimen of 7.5 mg daily, except 10 mg on Mondays and Fridays, but has trended down from 2.60 on 11/1.    INR was therapeutic (2.29) on admit 10/26; dropped to 1.84 on 10/26, and was given 10 mg instead of 7.5 mg on 10/27.  INR therapeutic since 10/28, though some variation.  Coumadin 10 mg instead of 7.5 mg given again on 10/29 (Thursday).  Goal of Therapy:  INR 2-3 Monitor platelets by anticoagulation protocol: Yes   Plan:   Continue home Coumadin regimen of 7.5 mg daily except 10 mg on Mondays and Fridays.  10 mg due today (Monday).  May need to consider modifying home regimen to 10 mg on MWF.  Daily PT/INR.  Bmet and CBC in am.  Arty Baumgartner, [place] Pager: [phone] 11/04/2013,12:08 PM

## 2013-11-04 NOTE — Progress Notes (Signed)
PROGRESS NOTE  William Flores QPR:[id number] DOB: May 15, 1950 DOA: 10/28/2013 PCP: Redge Gainer, MD  HPI/Recap of past 17 hours: 63 year old male with past medical history of atrial fibrillation on chronic Coumadin, diastolic heart failure and diabetes mellitus presented to the emergency room on 10/26 with progressively worsening shortness of breath for the last month, productive cough and 10 pound weight gain. Patient was admitted to the hospitalist service for bronchitis plus acute on chronic diastolic heart failure.  Cardiology consultation and patient has been treated with IV Lasix plus antibiotics.  Patient continues to do well and has been changed over to by mouth Lasix. As put out an additional 2 more liters for a total of 8, although his weight has not changed much, still 3 pounds over dry weight  Assessment/Plan: Active Problems:   A-fib: Cardiology seeing. Still some episodes of heart rate greater than 100, increased Cardizem to 360 starting 11/2    HTN (hypertension): Blood pressure is well controlled    DM type 2, uncontrolled, with renal complications-chronic kidney disease stage III: Last A1c at 7.9. Currently on sliding scale. CBG staying under 200, creatinine stable    Hyperlipidemia: Continue statin.    Morbid obesity: Patient meets criteria with BMI greater than 40.    OSA: slept well after starting C Pap   Acute respiratory failure with hypoxia: Much improving.   Acute bronchitis: Completed Zithromax 10/31   acute on chronic diastolic heart failure: Appreciate cardiology help. Lasix at twice a day plus metolazone.  Code Status: Full code  Family Communication: Left message with family  Disposition Plan: almost a dry weight, likely discharge hopefully tomorrow   Consultants:  Cardiology  Procedures:  None  Antibiotics:  Zithromax 10/26-10/31   Objective: BP 139/84 mmHg  Pulse 90  Temp(Src) 98.8 F (37.1 C) (Oral)  Resp 20  Ht 4' 11"  (1.499 m)   Wt 133 kg (293 lb 3.4 oz)  BMI 59.19 kg/m2  SpO2 94%  Intake/Output Summary (Last 24 hours) at 11/04/13 1600 Last data filed at 11/04/13 1058  Gross per 24 hour  Intake    323 ml  Output   2700 ml  Net  -2377 ml   Filed Weights   11/02/13 0602 11/03/13 0545 11/04/13 0526  Weight: 133.8 kg (294 lb 15.6 oz) 133 kg (293 lb 3.4 oz) 133 kg (293 lb 3.4 oz)    Exam: Unchanged from previous  General: Alert and oriented, no acute distress  Cardiovascular: Irregular rhythm, rate controlled  Respiratory: Decreased breath sounds throughout-no crackles  Abdomen: Soft, obese, nontender, positive bowel sounds  Musculoskeletal: No clubbing or cyanosis, 1+ pitting edema bilaterally   Data Reviewed: Basic Metabolic Panel:  Recent Labs Lab 11/01/13 1116 11/02/13 0554 11/03/13 11/03/13 0535 11/04/13 0500  NA 140 140 138 140 144  K 4.9 5.1 4.7 4.4 4.4  CL 97 99 94* 95* 96  CO2 35* 30 34* 35* 39*  GLUCOSE 158* 135* 135* 123* 151*  BUN 26* 31* 33* 30* 31*  CREATININE 1.18 1.29 1.48* 1.27 1.39*  CALCIUM 8.9 9.1 9.2 9.4 9.6   Liver Function Tests:  Recent Labs Lab 10/28/13 1746 10/29/13 0600  AST 23 27  ALT 21 21  ALKPHOS 69 73  BILITOT 0.4 0.4  PROT 6.8 6.9  ALBUMIN 3.3* 3.3*   No results for input(s): LIPASE, AMYLASE in the last 168 hours. No results for input(s): AMMONIA in the last 168 hours. CBC:  Recent Labs Lab 10/28/13 1746 10/29/13 0600 10/30/13 9935  11/03/13 0535  WBC 7.1 6.7 6.6 6.8  NEUTROABS 5.6 5.3  --   --   HGB 10.2* 10.5* 10.6* 10.0*  HCT 35.1* 37.1* 38.4* 35.4*  MCV 84.0 87.9 89.1 86.1  PLT 228 237 224 212   Cardiac Enzymes:    Recent Labs Lab 10/28/13 1746 10/29/13 1130 10/29/13 1516 10/29/13 2100  TROPONINI <0.30 <0.30 <0.30 <0.30   BNP (last 3 results)  Recent Labs  08/05/13 1403 10/28/13 1746  PROBNP 376.6* 325.7*   CBG:  Recent Labs Lab 11/03/13 1224 11/03/13 1656 11/03/13 2247 11/04/13 0815 11/04/13 1146  GLUCAP  139* 159* 140* 142* 152*    Recent Results (from the past 240 hour(s))  Culture, expectorated sputum-assessment     Status: None   Collection Time: 11/01/13  6:56 PM  Result Value Ref Range Status   Specimen Description SPUTUM  Final   Special Requests NONE  Final   Sputum evaluation   Final    THIS SPECIMEN IS ACCEPTABLE. RESPIRATORY CULTURE REPORT TO FOLLOW.   Report Status 11/02/2013 FINAL  Final  Culture, respiratory (NON-Expectorated)     Status: None   Collection Time: 11/01/13  6:56 PM  Result Value Ref Range Status   Specimen Description SPUTUM  Final   Special Requests NONE  Final   Gram Stain   Final    RARE WBC PRESENT, PREDOMINANTLY MONONUCLEAR RARE SQUAMOUS EPITHELIAL CELLS PRESENT FEW GRAM POSITIVE COCCI IN PAIRS IN CLUSTERS RARE GRAM NEGATIVE RODS Performed at [facility]    Culture   Final    NORMAL OROPHARYNGEAL FLORA Performed at [facility]    Report Status 11/04/2013 FINAL  Final     Studies: No results found.  Scheduled Meds: . allopurinol  300 mg Oral Daily  . atorvastatin  20 mg Oral Daily  . bisoprolol  2.5 mg Oral Daily  . diltiazem  360 mg Oral Daily  . furosemide  80 mg Oral BID  . insulin aspart  0-15 Units Subcutaneous TID WC  . insulin aspart  0-5 Units Subcutaneous QHS  . ketotifen  1 drop Both Eyes BID  . linagliptin  5 mg Oral Q supper   And  . metFORMIN  1,000 mg Oral BID WC  . lisinopril  20 mg Oral Daily  . metolazone  2.5 mg Oral Daily  . pantoprazole  40 mg Oral Daily  . sodium chloride  3 mL Intravenous Q12H  . sodium chloride  3 mL Intravenous Q12H  . warfarin  10 mg Oral Once per day on Mon Fri  . warfarin  7.5 mg Oral Once per day on Sun Tue Wed Thu Sat  . Warfarin - Pharmacist Dosing Inpatient   Does not apply q1800    Continuous Infusions:    Time spent: 15 minutes  [place] [facility] Pager [phone]. If 7PM-7AM, please contact night-coverage at [email or web address], password  [facility] 11/04/2013, 4:00 PM  LOS: 7 days

## 2013-11-04 NOTE — Progress Notes (Signed)
Patient ID: William Flores, male   DOB: February 27, 1950, 63 y.o.   MRN: [id number]   Patient Name: William Flores Date of Encounter: 11/04/2013     Active Problems:   A-fib   HTN (hypertension)   DM type 2, uncontrolled, with renal complications   Hyperlipidemia   Morbid obesity   OSA on CPAP   Acute respiratory failure with hypoxia   Acute bronchitis   Acute diastolic congestive heart failure    SUBJECTIVE  Has been up ambulating and feeling much better in terms of SOB and swelling. No CP. Still with productive cough  CURRENT MEDS . allopurinol  300 mg Oral Daily  . atorvastatin  20 mg Oral Daily  . diltiazem  360 mg Oral Daily  . furosemide  80 mg Intravenous BID  . insulin aspart  0-15 Units Subcutaneous TID WC  . insulin aspart  0-5 Units Subcutaneous QHS  . ketotifen  1 drop Both Eyes BID  . linagliptin  5 mg Oral Q supper   And  . metFORMIN  1,000 mg Oral BID WC  . lisinopril  20 mg Oral Daily  . metolazone  2.5 mg Oral Daily  . pantoprazole  40 mg Oral Daily  . sodium chloride  3 mL Intravenous Q12H  . sodium chloride  3 mL Intravenous Q12H  . warfarin  10 mg Oral Once per day on Mon Fri  . warfarin  7.5 mg Oral Once per day on Sun Tue Wed Thu Sat  . Warfarin - Pharmacist Dosing Inpatient   Does not apply q1800    OBJECTIVE  Filed Vitals:   11/03/13 2243 11/03/13 2300 11/04/13 0526 11/04/13 0917  BP: 134/65  134/82 139/84  Pulse: 85 82 101   Temp: 97.4 F (36.3 C)  97.7 F (36.5 C)   TempSrc: Oral  Oral   Resp: 18 16 18    Height:      Weight:   293 lb 3.4 oz (133 kg)   SpO2: 94% 95% 93%     Intake/Output Summary (Last 24 hours) at 11/04/13 1015 Last data filed at 11/04/13 0921  Gross per 24 hour  Intake    183 ml  Output   4000 ml  Net  -3817 ml   Filed Weights   11/02/13 0602 11/03/13 0545 11/04/13 0526  Weight: 294 lb 15.6 oz (133.8 kg) 293 lb 3.4 oz (133 kg) 293 lb 3.4 oz (133 kg)    PHYSICAL EXAM  General: Pleasant, NAD. Neuro: Alert and  oriented X 3. Moves all extremities spontaneously. Psych: Normal affect. HEENT:  Normal  Neck: Supple without bruits or JVD. Lungs:  Resp regular and unlabored, markedly decreased breath sound in bilateral bases and bibasilar rale Heart: irregular no s3, s4, or murmurs. Abdomen: Soft, non-tender, non-distended, BS + x 4.  Extremities: No clubbing, cyanosis. DP/PT/Radials 2+ and equal bilaterally. 1+ pitting edema in bilateral LE  Accessory Clinical Findings  CBC  Recent Labs  11/03/13 0535  WBC 6.8  HGB 10.0*  HCT 35.4*  MCV 86.1  PLT 007   Basic Metabolic Panel  Recent Labs  11/03/13 0535 11/04/13 0500  NA 140 144  K 4.4 4.4  CL 95* 96  CO2 35* 39*  GLUCOSE 123* 151*  BUN 30* 31*  CREATININE 1.27 1.39*  CALCIUM 9.4 9.6     TELE A-fib with HR 110-140s     Echocardiogram 08/07/2013  LV EF: 55% - 60% Study Conclusions - Left ventricle: The cavity size  was normal. Wall thickness was increased in a pattern of mild LVH. Systolic function was normal. The estimated ejection fraction was in the range of 55% to 60%. Indeterminant diastolic function (atrial fibrillation). Wall motion was normal; there were no regional wall motion abnormalities. - Aortic valve: Trileaflet; moderately calcified leaflets. Sclerosis without stenosis. - Mitral valve: Mildly to moderately calcified annulus. There was no significant regurgitation. - Left atrium: The atrium was mildly dilated. - Right ventricle: The cavity size was mildly dilated. Systolic function was normal. - Pulmonary arteries: No complete TR doppler jet so unable to estimate PA systolic pressure. - Systemic veins: The IVC measured 2.1 cm with < 50% respirophasic variation, suggesting RA pressure 8 mmHg. Impressions: - The patient was in atrial fibrillation. Normal LV size with mild LV hypertrophy. EF 55-60%. Aortic sclerosis without stenosis. Mildly dilated RV with normal systolic function.       Radiology/Studies  Dg Chest 2 View  10/28/2013   CLINICAL DATA:  Shortness of breath, personal history of diabetes, hypertension, atrial fibrillation, carcinoma of the colon  EXAM: CHEST  2 VIEW  COMPARISON:  08/05/2013  FINDINGS: Enlargement of cardiac silhouette with pulmonary vascular congestion.  Mediastinal contours normal.  Minimal chronic peribronchial thickening.  Extra pleural density at the lateral aspects of the hemithoraces bilaterally appears stable question fat.  No acute infiltrate, pleural effusion or pneumothorax.  No acute osseous findings.  IMPRESSION: Enlargement of cardiac silhouette with pulmonary vascular congestion.  Mild chronic bronchitic changes.  No acute abnormalities.   Electronically Signed   By: Lavonia Dana M.D.   On: 10/28/2013 17:06   Dg Abd 1 View  10/29/2013   CLINICAL DATA:  Abdominal swelling  EXAM: ABDOMEN - 1 VIEW  COMPARISON:  05/27/2004  FINDINGS: The images are not marked supine or upright. Limited image quality due to large patient size  Normal bowel gas pattern.  No acute abnormality identified.  IMPRESSION: Negative   Electronically Signed   By: Franchot Gallo M.D.   On: 10/29/2013 08:30    ASSESSMENT AND PLAN  William Flores is a 63 year old Caucasian male with PMH of atrial fibrillation (on chronic anticoagulation), chronic diastolic HF (EF 88-67% on Echo 08/2013), Type II Diabetes Mellitus, HTN, and HLD presenting to [facility] ED on 10/28/2013 with acute resp failure with hypoxia in the setting of acute on chronic diastolic heart failure.   1 . Acute on Chronic Diastolic CHF:   - Echocardiogram on 08/07/2013 showed EF of 55-60%. BNP on 10/28/2013 was 325.7.   - Net Output since admission is 8.3L. Dry weight for patient is 290lbs. He is 293 lbs today.  - He was iniitally slow to diurese but has been doing much better on IV Lasix 20m BID and metolazone 2.51mqd  - Creat slightly elevated today at 1.39. Consider backing off diuresis or switching  to PO. He is getting close to his dry weight and is feeling better  2. Atrial Fibrillation:   - His HR is moderately controlled on  increased dose of Cardizem 360. Still 110s-140s this AM. I think the higher number were when he was up walking  - Continue Warfarin for anticoagulation. INR is therapeutic at 2.17    SiJudy PimplePA-CFACC 11/04/2013 10:15 AM

## 2013-11-04 NOTE — Progress Notes (Signed)
SATURATION QUALIFICATIONS: (This note is used to comply with regulatory documentation for home oxygen)  Patient Saturations on Room Air at Rest = 93%  Patient Saturations on Room Air while Ambulating = 90%  Patient Saturations on 1 Liters of oxygen while Ambulating = 94%  Please briefly explain why patient needs home oxygen:

## 2013-11-05 LAB — BASIC METABOLIC PANEL
ANION GAP: 11 (ref 5–15)
BUN: 34 mg/dL — ABNORMAL HIGH (ref 6–23)
CHLORIDE: 93 meq/L — AB (ref 96–112)
CO2: 39 meq/L — AB (ref 19–32)
Calcium: 9.5 mg/dL (ref 8.4–10.5)
Creatinine, Ser: 1.42 mg/dL — ABNORMAL HIGH (ref 0.50–1.35)
GFR calc Af Amer: 60 mL/min — ABNORMAL LOW (ref >90)
GFR calc non Af Amer: 52 mL/min — ABNORMAL LOW (ref >90)
GLUCOSE: 138 mg/dL — AB (ref 70–99)
POTASSIUM: 4.5 meq/L (ref 3.7–5.3)
SODIUM: 143 meq/L (ref 137–147)

## 2013-11-05 LAB — CBC
HEMATOCRIT: 37.7 % — AB (ref 39.0–52.0)
HEMOGLOBIN: 10.8 g/dL — AB (ref 13.0–17.0)
MCH: 24.1 pg — ABNORMAL LOW (ref 26.0–34.0)
MCHC: 28.6 g/dL — ABNORMAL LOW (ref 30.0–36.0)
MCV: 84.2 fL (ref 78.0–100.0)
Platelets: 224 10*3/uL (ref 150–400)
RBC: 4.48 MIL/uL (ref 4.22–5.81)
RDW: 20.5 % — AB (ref 11.5–15.5)
WBC: 6.4 10*3/uL (ref 4.0–10.5)

## 2013-11-05 LAB — GLUCOSE, CAPILLARY
GLUCOSE-CAPILLARY: 151 mg/dL — AB (ref 70–99)
Glucose-Capillary: 139 mg/dL — ABNORMAL HIGH (ref 70–99)

## 2013-11-05 LAB — PROTIME-INR
INR: 1.98 — AB (ref 0.00–1.49)
Prothrombin Time: 22.6 seconds — ABNORMAL HIGH (ref 11.6–15.2)

## 2013-11-05 MED ORDER — BISOPROLOL FUMARATE 5 MG PO TABS: 5.0000 mg | ORAL_TABLET | Freq: Every day | ORAL | Status: DC

## 2013-11-05 MED ORDER — METOLAZONE 2.5 MG PO TABS
2.5000 mg | ORAL_TABLET | ORAL | Status: DC
  Filled 2013-11-05: qty 1

## 2013-11-05 MED ORDER — DILTIAZEM HCL ER COATED BEADS 360 MG PO CP24: 360.0000 mg | ORAL_CAPSULE | Freq: Every day | ORAL | Status: DC

## 2013-11-05 MED ORDER — HYDROCOD POLST-CHLORPHEN POLST 10-8 MG/5ML PO LQCR: 5.0000 mL | Freq: Two times a day (BID) | ORAL | Status: DC | PRN

## 2013-11-05 MED ORDER — METOLAZONE 2.5 MG PO TABS: 2.5000 mg | ORAL_TABLET | Freq: Every day | ORAL | Status: DC

## 2013-11-05 MED ORDER — BISOPROLOL FUMARATE 5 MG PO TABS: 2.5000 mg | ORAL_TABLET | Freq: Every day | ORAL | Status: DC

## 2013-11-05 NOTE — Progress Notes (Signed)
William Flores to be D/C'd Home per MD order.  Discussed with the patient and all questions fully answered.    Medication List    TAKE these medications        allopurinol 100 MG tablet  Commonly known as:  ZYLOPRIM  Take 100 mg by mouth daily. Patient takes with 100 mg to equal 400 mg     allopurinol 300 MG tablet  Commonly known as:  ZYLOPRIM  Take 300 mg by mouth daily. Patient takes with 300 mg to equal 400 mg dose     aspirin 81 MG tablet  Take 81 mg by mouth daily.     atorvastatin 20 MG tablet  Commonly known as:  LIPITOR  Take 20 mg by mouth daily.     azelastine 0.05 % ophthalmic solution  Commonly known as:  OPTIVAR  Place 2 drops into both eyes 2 (two) times daily.     benzonatate 100 MG capsule  Commonly known as:  TESSALON  Take 1 capsule (100 mg total) by mouth 2 (two) times daily as needed for cough.     bisoprolol 5 MG tablet  Commonly known as:  ZEBETA  Take 0.5 tablets (2.5 mg total) by mouth daily.     chlorpheniramine-HYDROcodone 10-8 MG/5ML Lqcr  Commonly known as:  TUSSIONEX  Take 5 mLs by mouth every 12 (twelve) hours as needed for cough.     diltiazem 360 MG 24 hr capsule  Commonly known as:  CARDIZEM CD  Take 1 capsule (360 mg total) by mouth daily.     furosemide 40 MG tablet  Commonly known as:  LASIX  TAKE 1 TABLET TWICE A DAY     glucose blood test strip  Use to check BG daily     lisinopril 20 MG tablet  Commonly known as:  PRINIVIL,ZESTRIL  Take 20 mg by mouth daily.     metolazone 2.5 MG tablet  Commonly known as:  ZAROXOLYN  Take 1 tablet (2.5 mg total) by mouth daily.     omeprazole 20 MG capsule  Commonly known as:  PRILOSEC  Take 1 capsule (20 mg total) by mouth daily.     ONETOUCH DELICA LANCETS 83M Misc  1 each by Does not apply route daily. Use one lancet to check BG daily     sitaGLIPtin-metformin 50-1000 MG per tablet  Commonly known as:  JANUMET  Take 1 tablet by mouth 2 (two) times daily with a meal.     warfarin 5 MG tablet  Commonly known as:  COUMADIN  Take 7.5-10 mg by mouth See admin instructions. Patient takes 2 ( 5 mg ) for 10 mg on Monday and Friday and all the rest of the day patient takes 7.5 mg ( 1.5 tabs)        VVS, Skin clean, dry and intact without evidence of skin break down, no evidence of skin tears noted. IV catheter discontinued intact. Site without signs and symptoms of complications. Dressing and pressure applied.  An After Visit Summary was printed and given to the patient.  D/c education completed with patient/family including follow up instructions, medication list, d/c activities limitations if indicated, with other d/c instructions as indicated by MD - patient able to verbalize understanding, all questions fully answered.   Patient instructed to return to ED, call 911, or call MD for any changes in condition.   Patient escorted via [place], and D/C home via private auto.  Kyson Kupper D 11/05/2013 1:43 PM

## 2013-11-05 NOTE — Discharge Summary (Signed)
Discharge Summary  William Flores NWG:[id number] DOB: Sep 26, 1950  PCP: Redge Gainer, MD  Admit date: 10/28/2013 Discharge date: 11/05/2013  Time spent: 25 minutes  Recommendations for Outpatient Follow-up:  1. Patient will follow-up with [facility] cardiology in the next 2 weeks. They will call the patient to set up appointment. 2. Medication change: Cardizem changed to 360 mg by mouth daily 3. New medication: Zebeta 2.5 mg by mouth daily 4. New medication: Metolazone 2.5 mg daily 5. Patient's dry weight is 279 pounds  Discharge Diagnoses:  Active Hospital Problems   Diagnosis Date Noted  . Acute on chronic diastolic heart failure   . Essential hypertension   . Persistent atrial fibrillation   . Acute bronchitis 10/30/2013  . Acute diastolic congestive heart failure 10/30/2013  . Acute respiratory failure with hypoxia 10/28/2013  . Morbid obesity 08/05/2013  . OSA on CPAP 08/05/2013  . Hyperlipidemia   . HTN (hypertension) 05/25/2012  . DM type 2, uncontrolled, with renal complications 57/84/6962  . A-fib 03/29/2012    Resolved Hospital Problems   Diagnosis Date Noted Date Resolved  No resolved problems to display.    Discharge Condition: improved, being discharged home  Diet recommendation: carb modified heart healthy  Filed Weights   11/04/13 0526 11/05/13 0651 11/05/13 0840  Weight: 133 kg (293 lb 3.4 oz) 126.735 kg (279 lb 6.4 oz) 126.554 kg (279 lb)    History of present illness:  63 year old male with past medical history of atrial fibrillation on chronic Coumadin, diastolic heart failure and diabetes mellitus presented to the emergency room on 10/26 with progressively worsening shortness of breath for the last month, productive cough and 10 pound weight gain. Patient was admitted to the hospitalist service for bronchitis plus acute on chronic diastolic heart failure. Cardiology consultation and patient has been treated with IV Lasix plus antibiotics.  Hospital  Course:  Active Problems:  chronic A-fibon chronic anticoagulation: With respiratory distress, heart rate difficult to control. Cardizem titrated up and patient discharged on new dose of 360 mg by mouth daily. Pharmacy managed patient's Coumadin and INR remained therapeutic   HTN (hypertension): Blood pressure well controlled.    DM type 2, uncontrolled, with renal complications/stage III chronic kidney disease:last A1c at 7.9. Patient remained on sliding scale and CBGs remained stable during his hospitalization. Creatinine trended up slightly and was 1.42 upon discharge.    Hyperlipidemia: continue on statin.    Morbid obesity: Patient meets criteria with BMI greater than 40    OSA on CPAP: Patient was continued on his nightly C Pap regimen, tolerated well.    Acute respiratory failure with hypoxia: secondary to bronchitis plus CHF. Resolved.initially required oxygen, but by day of discharge, patient was able to ambulate on room air in the hallway and keep his sats above 92%.    Acute bronchitis: Started on antibiotics and completed 5 day course of Zithromax.    Acute on chronic diastolic heart failure: Principal problem. Followed by cardiology. Patient aggressively diuresed with IV Lasix and then metolazone as well. Diuresed over 10 L and byday of discharge was down 18 pounds to dry weight of 279 pounds.   Procedures:  none  Consultations:  cardiology  Discharge Exam: BP 124/75 mmHg  Pulse 86  Temp(Src) 98.3 F (36.8 C) (Oral)  Resp 20  Ht 4' 11"  (1.499 m)  Wt 126.554 kg (279 lb)  BMI 56.32 kg/m2  SpO2 93%  General: alert and oriented 3, no acute distress Cardiovascular: regular rate and rhythm, S1-S2  Respiratory: clear to auscultation bilaterally  Discharge Instructions You were cared for by a hospitalist during your hospital stay. If you have any questions about your discharge medications or the care you received while you were in the hospital after you are  discharged, you can call the unit and asked to speak with the hospitalist on call if the hospitalist that took care of you is not available. Once you are discharged, your primary care physician will handle any further medical issues. Please note that NO REFILLS for any discharge medications will be authorized once you are discharged, as it is imperative that you return to your primary care physician (or establish a relationship with a primary care physician if you do not have one) for your aftercare needs so that they can reassess your need for medications and monitor your lab values.     Medication List    TAKE these medications        allopurinol 100 MG tablet  Commonly known as:  ZYLOPRIM  Take 100 mg by mouth daily. Patient takes with 100 mg to equal 400 mg     allopurinol 300 MG tablet  Commonly known as:  ZYLOPRIM  Take 300 mg by mouth daily. Patient takes with 300 mg to equal 400 mg dose     aspirin 81 MG tablet  Take 81 mg by mouth daily.     atorvastatin 20 MG tablet  Commonly known as:  LIPITOR  Take 20 mg by mouth daily.     azelastine 0.05 % ophthalmic solution  Commonly known as:  OPTIVAR  Place 2 drops into both eyes 2 (two) times daily.     benzonatate 100 MG capsule  Commonly known as:  TESSALON  Take 1 capsule (100 mg total) by mouth 2 (two) times daily as needed for cough.     bisoprolol 5 MG tablet  Commonly known as:  ZEBETA  Take 0.5 tablets (2.5 mg total) by mouth daily.     chlorpheniramine-HYDROcodone 10-8 MG/5ML Lqcr  Commonly known as:  TUSSIONEX  Take 5 mLs by mouth every 12 (twelve) hours as needed for cough.     diltiazem 360 MG 24 hr capsule  Commonly known as:  CARDIZEM CD  Take 1 capsule (360 mg total) by mouth daily.     furosemide 40 MG tablet  Commonly known as:  LASIX  TAKE 1 TABLET TWICE A DAY     glucose blood test strip  Use to check BG daily     lisinopril 20 MG tablet  Commonly known as:  PRINIVIL,ZESTRIL  Take 20 mg by  mouth daily.     metolazone 2.5 MG tablet  Commonly known as:  ZAROXOLYN  Take 1 tablet (2.5 mg total) by mouth daily.     omeprazole 20 MG capsule  Commonly known as:  PRILOSEC  Take 1 capsule (20 mg total) by mouth daily.     ONETOUCH DELICA LANCETS 32D Misc  1 each by Does not apply route daily. Use one lancet to check BG daily     sitaGLIPtin-metformin 50-1000 MG per tablet  Commonly known as:  JANUMET  Take 1 tablet by mouth 2 (two) times daily with a meal.     warfarin 5 MG tablet  Commonly known as:  COUMADIN  Take 7.5-10 mg by mouth See admin instructions. Patient takes 2 ( 5 mg ) for 10 mg on Monday and Friday and all the rest of the day patient takes 7.5 mg ( 1.5  tabs)       Allergies  Allergen Reactions  . Codeine     Headache       The results of significant diagnostics from this hospitalization (including imaging, microbiology, ancillary and laboratory) are listed below for reference.    Significant Diagnostic Studies: Dg Chest 2 View  10/28/2013   CLINICAL DATA:  Shortness of breath, personal history of diabetes, hypertension, atrial fibrillation, carcinoma of the colon  EXAM: CHEST  2 VIEW  COMPARISON:  08/05/2013  FINDINGS: Enlargement of cardiac silhouette with pulmonary vascular congestion.  Mediastinal contours normal.  Minimal chronic peribronchial thickening.  Extra pleural density at the lateral aspects of the hemithoraces bilaterally appears stable question fat.  No acute infiltrate, pleural effusion or pneumothorax.  No acute osseous findings.  IMPRESSION: Enlargement of cardiac silhouette with pulmonary vascular congestion.  Mild chronic bronchitic changes.  No acute abnormalities.   Electronically Signed   By: Lavonia Dana M.D.   On: 10/28/2013 17:06   Dg Abd 1 View  10/29/2013   CLINICAL DATA:  Abdominal swelling  EXAM: ABDOMEN - 1 VIEW  COMPARISON:  05/27/2004  FINDINGS: The images are not marked supine or upright. Limited image quality due to large  patient size  Normal bowel gas pattern.  No acute abnormality identified.  IMPRESSION: Negative   Electronically Signed   By: Franchot Gallo M.D.   On: 10/29/2013 08:30    Microbiology: Recent Results (from the past 240 hour(s))  Culture, expectorated sputum-assessment     Status: None   Collection Time: 11/01/13  6:56 PM  Result Value Ref Range Status   Specimen Description SPUTUM  Final   Special Requests NONE  Final   Sputum evaluation   Final    THIS SPECIMEN IS ACCEPTABLE. RESPIRATORY CULTURE REPORT TO FOLLOW.   Report Status 11/02/2013 FINAL  Final  Culture, respiratory (NON-Expectorated)     Status: None   Collection Time: 11/01/13  6:56 PM  Result Value Ref Range Status   Specimen Description SPUTUM  Final   Special Requests NONE  Final   Gram Stain   Final    RARE WBC PRESENT, PREDOMINANTLY MONONUCLEAR RARE SQUAMOUS EPITHELIAL CELLS PRESENT FEW GRAM POSITIVE COCCI IN PAIRS IN CLUSTERS RARE GRAM NEGATIVE RODS Performed at [facility]    Culture   Final    NORMAL OROPHARYNGEAL FLORA Performed at [facility]    Report Status 11/04/2013 FINAL  Final     Labs: Basic Metabolic Panel:  Recent Labs Lab 11/02/13 0554 11/03/13 11/03/13 0535 11/04/13 0500 11/05/13 0640  NA 140 138 140 144 143  K 5.1 4.7 4.4 4.4 4.5  CL 99 94* 95* 96 93*  CO2 30 34* 35* 39* 39*  GLUCOSE 135* 135* 123* 151* 138*  BUN 31* 33* 30* 31* 34*  CREATININE 1.29 1.48* 1.27 1.39* 1.42*  CALCIUM 9.1 9.2 9.4 9.6 9.5   Liver Function Tests: No results for input(s): AST, ALT, ALKPHOS, BILITOT, PROT, ALBUMIN in the last 168 hours. No results for input(s): LIPASE, AMYLASE in the last 168 hours. No results for input(s): AMMONIA in the last 168 hours. CBC:  Recent Labs Lab 10/30/13 0651 11/03/13 0535 11/05/13 0640  WBC 6.6 6.8 6.4  HGB 10.6* 10.0* 10.8*  HCT 38.4* 35.4* 37.7*  MCV 89.1 86.1 84.2  PLT 224 212 224   Cardiac Enzymes:  Recent Labs Lab 10/29/13 1130  10/29/13 1516 10/29/13 2100  TROPONINI <0.30 <0.30 <0.30   BNP: BNP (last 3 results)  Recent Labs  08/05/13 1403 10/28/13 1746  PROBNP 376.6* 325.7*   CBG:  Recent Labs Lab 11/04/13 0815 11/04/13 1146 11/04/13 1655 11/04/13 2203 11/05/13 0748  GLUCAP 142* 152* 163* 169* 151*       Signed:  Immanuel Fedak K  [facility] 11/05/2013, 8:47 AM

## 2013-11-05 NOTE — Progress Notes (Signed)
SATURATION QUALIFICATIONS: (This note is used to comply with regulatory documentation for home oxygen)  Patient Saturations on Room Air at Rest = 95%  Patient Saturations on Room Air while Ambulating = 90%  Please briefly explain why patient needs home oxygen: No oxygen needs at home

## 2013-11-05 NOTE — Progress Notes (Signed)
SATURATION QUALIFICATIONS: (This note is used to comply with regulatory documentation for home oxygen)  Patient Saturations on Room Air at Rest = 95%  Patient Saturations on Room Air while Ambulating = 86%  Patient Saturations on 1 Liters of oxygen while Ambulating = 92%  Please briefly explain why patient needs home oxygen: Needs 1L of O2 while ambulating

## 2013-11-05 NOTE — Progress Notes (Signed)
Patient ID: William Flores, male   DOB: [birth year], 63 y.o.   MRN: [id number]   Patient Name: William Flores Date of Encounter: 11/05/2013     Active Problems:   A-fib   HTN (hypertension)   DM type 2, uncontrolled, with renal complications   Hyperlipidemia   Morbid obesity   OSA on CPAP   Acute respiratory failure with hypoxia   Acute bronchitis   Acute diastolic congestive heart failure   Acute on chronic diastolic heart failure   Essential hypertension   Persistent atrial fibrillation    SUBJECTIVE  Feeling well. Wants to go home  CURRENT MEDS . allopurinol  300 mg Oral Daily  . atorvastatin  20 mg Oral Daily  . bisoprolol  2.5 mg Oral Daily  . diltiazem  360 mg Oral Daily  . furosemide  80 mg Oral BID  . insulin aspart  0-15 Units Subcutaneous TID WC  . insulin aspart  0-5 Units Subcutaneous QHS  . ketotifen  1 drop Both Eyes BID  . linagliptin  5 mg Oral Q supper   And  . metFORMIN  1,000 mg Oral BID WC  . lisinopril  20 mg Oral Daily  . metolazone  2.5 mg Oral Daily  . pantoprazole  40 mg Oral Daily  . sodium chloride  3 mL Intravenous Q12H  . sodium chloride  3 mL Intravenous Q12H  . warfarin  10 mg Oral Once per day on Mon Fri  . warfarin  7.5 mg Oral Once per day on Sun Tue Wed Thu Sat  . Warfarin - Pharmacist Dosing Inpatient   Does not apply q1800    OBJECTIVE  Filed Vitals:   11/04/13 1405 11/04/13 2334 11/05/13 0651 11/05/13 0840  BP:  125/62 124/75   Pulse: 90 67 86   Temp: 98.8 F (37.1 C) 97.4 F (36.3 C) 98.3 F (36.8 C)   TempSrc: Oral Oral Oral   Resp: 20 18 20    Height:      Weight:   279 lb 6.4 oz (126.735 kg) 279 lb (126.554 kg)  SpO2: 94% 94% 93%     Intake/Output Summary (Last 24 hours) at 11/05/13 0847 Last data filed at 11/05/13 0500  Gross per 24 hour  Intake    443 ml  Output   1900 ml  Net  -1457 ml   Filed Weights   11/04/13 0526 11/05/13 0651 11/05/13 0840  Weight: 293 lb 3.4 oz (133 kg) 279 lb 6.4 oz (126.735 kg)  279 lb (126.554 kg)    PHYSICAL EXAM  General: Pleasant, NAD. Neuro: Alert and oriented X 3. Moves all extremities spontaneously. Psych: Normal affect. HEENT:  Normal  Neck: Supple without bruits or JVD. Lungs:  Resp regular and unlabored,  Heart: irregular no s3, s4, or murmurs. Abdomen: Soft, non-tender, non-distended, BS + x 4.  Extremities: No clubbing, cyanosis. DP/PT/Radials 2+ and equal bilaterally. No edema  Accessory Clinical Findings  CBC  Recent Labs  11/03/13 0535 11/05/13 0640  WBC 6.8 6.4  HGB 10.0* 10.8*  HCT 35.4* 37.7*  MCV 86.1 84.2  PLT 212 235   Basic Metabolic Panel  Recent Labs  11/04/13 0500 11/05/13 0640  NA 144 143  K 4.4 4.5  CL 96 93*  CO2 39* 39*  GLUCOSE 151* 138*  BUN 31* 34*  CREATININE 1.39* 1.42*  CALCIUM 9.6 9.5     TELE A-fib with HR 110-140s     Echocardiogram 08/07/2013  LV EF: 55% -  60% Study Conclusions - Left ventricle: The cavity size was normal. Wall thickness was increased in a pattern of mild LVH. Systolic function was normal. The estimated ejection fraction was in the range of 55% to 60%. Indeterminant diastolic function (atrial fibrillation). Wall motion was normal; there were no regional wall motion abnormalities. - Aortic valve: Trileaflet; moderately calcified leaflets. Sclerosis without stenosis. - Mitral valve: Mildly to moderately calcified annulus. There was no significant regurgitation. - Left atrium: The atrium was mildly dilated. - Right ventricle: The cavity size was mildly dilated. Systolic function was normal. - Pulmonary arteries: No complete TR doppler jet so unable to estimate PA systolic pressure. - Systemic veins: The IVC measured 2.1 cm with < 50% respirophasic variation, suggesting RA pressure 8 mmHg. Impressions: - The patient was in atrial fibrillation. Normal LV size with mild LV hypertrophy. EF 55-60%. Aortic sclerosis without stenosis. Mildly dilated RV with normal systolic  function.      Radiology/Studies  Dg Chest 2 View  10/28/2013   CLINICAL DATA:  Shortness of breath, personal history of diabetes, hypertension, atrial fibrillation, carcinoma of the colon  EXAM: CHEST  2 VIEW  COMPARISON:  08/05/2013  FINDINGS: Enlargement of cardiac silhouette with pulmonary vascular congestion.  Mediastinal contours normal.  Minimal chronic peribronchial thickening.  Extra pleural density at the lateral aspects of the hemithoraces bilaterally appears stable question fat.  No acute infiltrate, pleural effusion or pneumothorax.  No acute osseous findings.  IMPRESSION: Enlargement of cardiac silhouette with pulmonary vascular congestion.  Mild chronic bronchitic changes.  No acute abnormalities.   Electronically Signed   By: Lavonia Dana M.D.   On: 10/28/2013 17:06   Dg Abd 1 View  10/29/2013   CLINICAL DATA:  Abdominal swelling  EXAM: ABDOMEN - 1 VIEW  COMPARISON:  05/27/2004  FINDINGS: The images are not marked supine or upright. Limited image quality due to large patient size  Normal bowel gas pattern.  No acute abnormality identified.  IMPRESSION: Negative   Electronically Signed   By: Franchot Gallo M.D.   On: 10/29/2013 08:30    ASSESSMENT AND PLAN  William Flores is a 63 year old Caucasian male with PMH of atrial fibrillation (on chronic anticoagulation), chronic diastolic HF (EF 79-89% on Echo 08/2013), Type II Diabetes Mellitus, HTN, and HLD presenting to [facility] ED on 10/28/2013 with acute resp failure with hypoxia in the setting of acute on chronic diastolic heart failure.   1 . Acute on Chronic Diastolic CHF:   - Echocardiogram on 08/07/2013 showed EF of 55-60%. BNP on 10/28/2013 was 325.7.   - Net Output since admission is 8.3L. Dry weight for patient is 290lbs. He is 279 today (question accuracy)  - He was iniitally slow to diurese but has been doing much better on IV Lasix 58m BID and metolazone 2.512mqd. Switched to PO Lasix 8046mID yesterday   - Creat  slightly elevated today at 1.42. Lasix was converted from IV to PO yesterday  2. Atrial Fibrillation:   - His HR was moderately controlled on Cardizem 360, bisoprolol 2.5mg85m added yesterday. Not on tele today. Being discharged by IM  - Continue Warfarin for anticoagulation. INR is just sub-therapeutic at 1.98  Dispo- patient said he is going home today. He will need to follow up with Dr .BerrGwenlyn Foundan APP in the office. I will have it arranged that the office call him to make an appointment.   SignJudy Pimple-CFACC 11/05/2013 8:47  AM

## 2013-11-06 ENCOUNTER — Telehealth: Payer: Self-pay | Admitting: Cardiovascular Disease

## 2013-11-11 NOTE — Telephone Encounter (Signed)
Closed encounter °

## 2013-11-12 ENCOUNTER — Encounter: Payer: Self-pay | Admitting: Family Medicine

## 2013-11-12 ENCOUNTER — Ambulatory Visit (INDEPENDENT_AMBULATORY_CARE_PROVIDER_SITE_OTHER): Payer: BC Managed Care – PPO | Admitting: Family Medicine

## 2013-11-12 VITALS — BP 94/60 | HR 78 | Temp 97.2°F | Ht 64.0 in | Wt 281.8 lb

## 2013-11-12 DIAGNOSIS — R0989 Other specified symptoms and signs involving the circulatory and respiratory systems: Secondary | ICD-10-CM

## 2013-11-12 DIAGNOSIS — I4891 Unspecified atrial fibrillation: Secondary | ICD-10-CM

## 2013-11-12 DIAGNOSIS — R5383 Other fatigue: Secondary | ICD-10-CM

## 2013-11-12 NOTE — Progress Notes (Signed)
   Subjective:    Patient ID: William Flores, male    DOB: 02-25-1950, 63 y.o.   MRN: [id number]  HPI   Review of Systems     Objective:    BP 94/60 mmHg  Pulse 78  Temp(Src) 97.2 F (36.2 C) (Oral)  Ht 5' 4"  (1.626 m)  Wt 281 lb 12.8 oz (127.824 kg)  BMI 48.35 kg/m2 Physical Exam        Assessment & Plan:     ICD-9-CM ICD-10-CM   1. Pulse irregularity 785.9 R09.89 EKG 12-Lead  2. Other fatigue 780.79 R53.83 EKG 12-Lead  3. Atrial fibrillation, unspecified 427.31 I48.91 POCT INR     BMP8+EGFR     POCT CBC   Hold bisoprolol and lisinopril.  Follow up with clinical pharmacist this week.  Labs bmp cbc ptinr ordered.  Out of work for next few days. Push po fluids and rest  No Follow-up on file.  Lysbeth Penner FNP

## 2013-11-12 NOTE — Progress Notes (Signed)
   Subjective:    Patient ID: William Flores, male    DOB: July 04, 1950, 63 y.o.   MRN: [id number]  HPI Patient is here for follow up.  He was recently admitted to the hospital for afib with RVR and CHF.  He has been put on zaroxyln along with lasix bid.  His cardizem was increased to 38m po qd.  He is weak.  Review of Systems  Constitutional: Negative for fever.  HENT: Negative for ear pain.   Eyes: Negative for discharge.  Respiratory: Negative for cough.   Cardiovascular: Negative for chest pain.  Gastrointestinal: Negative for abdominal distention.  Endocrine: Negative for polyuria.  Genitourinary: Negative for difficulty urinating.  Musculoskeletal: Negative for gait problem and neck pain.  Skin: Negative for color change and rash.  Neurological: Negative for speech difficulty and headaches.  Psychiatric/Behavioral: Negative for agitation.       Objective:    BP 94/60 mmHg  Pulse 78  Temp(Src) 97.2 F (36.2 C) (Oral)  Ht 5' 4"  (1.626 m)  Wt 281 lb 12.8 oz (127.824 kg)  BMI 48.35 kg/m2 Physical Exam        Assessment & Plan:     ICD-9-CM ICD-10-CM   1. Pulse irregularity 785.9 R09.89 EKG 12-Lead  2. Other fatigue 780.79 R53.83 EKG 12-Lead  Hold lisinopril    No Follow-up on file.  William Flores

## 2013-11-13 ENCOUNTER — Encounter: Payer: Self-pay | Admitting: Pharmacist

## 2013-11-13 ENCOUNTER — Ambulatory Visit (INDEPENDENT_AMBULATORY_CARE_PROVIDER_SITE_OTHER): Payer: BC Managed Care – PPO | Admitting: Pharmacist

## 2013-11-13 VITALS — BP 118/64 | HR 69 | Ht 64.0 in | Wt 277.8 lb

## 2013-11-13 DIAGNOSIS — I4891 Unspecified atrial fibrillation: Secondary | ICD-10-CM

## 2013-11-13 DIAGNOSIS — Z79899 Other long term (current) drug therapy: Secondary | ICD-10-CM

## 2013-11-13 DIAGNOSIS — I5032 Chronic diastolic (congestive) heart failure: Secondary | ICD-10-CM

## 2013-11-13 LAB — POCT CBC
Granulocyte percent: 76 %G (ref 37–80)
HCT, POC: 39.2 % — AB (ref 43.5–53.7)
Hemoglobin: 11 g/dL — AB (ref 14.1–18.1)
Lymph, poc: 0.8 (ref 0.6–3.4)
MCH, POC: 22.4 pg — AB (ref 27–31.2)
MCHC: 28.1 g/dL — AB (ref 31.8–35.4)
MCV: 79.7 fL — AB (ref 80–97)
MPV: 7.9 fL (ref 0–99.8)
POC Granulocyte: 3.6 (ref 2–6.9)
POC LYMPH PERCENT: 17.4 %L (ref 10–50)
Platelet Count, POC: 279 10*3/uL (ref 142–424)
RBC: 4.9 M/uL (ref 4.69–6.13)
RDW, POC: 21.7 %
WBC: 4.7 10*3/uL (ref 4.6–10.2)

## 2013-11-13 LAB — POCT INR: INR: 2.8

## 2013-11-13 NOTE — Progress Notes (Signed)
Patient ID: William Flores, male   DOB: 1950/09/14, 63 y.o.   MRN: [id number]  Subjective/Objective    William Flores is a 63 y.o. male who presents for recheck INR.  He was recently hospitalized for diastolic congestive heart failure.  He was seen yesterday by [name], NP and was found to have low BP.  He was instructed to hold bisoprolol and lisinopril.  He was also instructed to RTC today for INR and other labs. Patient denies any s/s of bleeding.  There are many recent medication changes noted in the last 2 weeks.   His weigh prior to hospitalization was 301# (10/28/2013) and was 281# at yeasterday's visit (11/12/2013)  Today's weight was 277.8#. Patient continues to have cough but denies SOB.   Filed Vitals:   11/13/13 1109  BP: 118/64  Pulse: 69    The following portions of the patient's history were reviewed and updated as appropriate: allergies, current medications, past family history, past medical history, past social history, past surgical history and problem list.    INR was 2.8 today  Assessment:   Therapeutic Anticoagulation CHF - diastolic Medication Management   Plan:   1.   Anticoagulation Dose Instructions as of 11/13/2013      Dorene Grebe Tue Wed Thu Fri Sat   New Dose 7.5 mg 10 mg 7.5 mg 7.5 mg 7.5 mg 10 mg 7.5 mg    Description        Continue 24m Mondays and Fridays and 7.587mall other days      2.  Recommended that he take metolazone 15 - 30 minutes prior to furosemide each morning.  We are checking BMP today.  May needed to decrease metolazone to MWF based on results.  3.  BMP, CBC and BNP ordered today - pending.  4.  Keep follow up with cardiologist next week - will probably add back ACE-I or Beta Blocker at lower doses.   TaCherre RobinsPharmD, CPP

## 2013-11-13 NOTE — Patient Instructions (Signed)
Anticoagulation Dose Instructions as of 11/13/2013      Dorene Grebe Tue Wed Thu Fri Sat   New Dose 7.5 mg 10 mg 7.5 mg 7.5 mg 7.5 mg 10 mg 7.5 mg    Description        Continue 27m Mondays and Fridays and 7.551mall other days      INR was 2.8 today

## 2013-11-14 ENCOUNTER — Other Ambulatory Visit: Payer: Self-pay | Admitting: Family Medicine

## 2013-11-14 LAB — BMP8+EGFR
BUN/Creatinine Ratio: 41 — ABNORMAL HIGH (ref 10–22)
BUN: 67 mg/dL — ABNORMAL HIGH (ref 8–27)
CO2: 28 mmol/L (ref 18–29)
Calcium: 9.9 mg/dL (ref 8.6–10.2)
Chloride: 96 mmol/L — ABNORMAL LOW (ref 97–108)
Creatinine, Ser: 1.62 mg/dL — ABNORMAL HIGH (ref 0.76–1.27)
GFR calc Af Amer: 52 mL/min/{1.73_m2} — ABNORMAL LOW (ref >59)
GFR calc non Af Amer: 45 mL/min/{1.73_m2} — ABNORMAL LOW (ref >59)
Glucose: 130 mg/dL — ABNORMAL HIGH (ref 65–99)
Potassium: 4.4 mmol/L (ref 3.5–5.2)
Sodium: 140 mmol/L (ref 134–144)

## 2013-11-14 LAB — BRAIN NATRIURETIC PEPTIDE: BNP: 105.5 pg/mL — AB (ref 0.0–100.0)

## 2013-11-15 ENCOUNTER — Telehealth: Payer: Self-pay | Admitting: Pharmacist

## 2013-11-15 NOTE — Telephone Encounter (Signed)
BNP improved since hospital. Also reviewed serum creatinine and BMP. I would recommend patient decrease zaroxolyn to just MWF instead of daily due to increase in creatinine and BUN.  Also discussed with [name], NP who saw him prior to and after recent hospitalization for CHF.  Recommend William Flores restart lisinopril 62m 1/2 tablet daily - monitor for s/s of low BP - dizziness, HA, low HR.  Tried to call patient - LMOVM to call office

## 2013-11-18 ENCOUNTER — Telehealth: Payer: Self-pay | Admitting: Family Medicine

## 2013-11-18 DIAGNOSIS — Z029 Encounter for administrative examinations, unspecified: Secondary | ICD-10-CM

## 2013-11-18 NOTE — Telephone Encounter (Signed)
Papers dropped off. In process.

## 2013-11-19 ENCOUNTER — Ambulatory Visit (INDEPENDENT_AMBULATORY_CARE_PROVIDER_SITE_OTHER): Payer: BC Managed Care – PPO | Admitting: Family Medicine

## 2013-11-19 ENCOUNTER — Encounter: Payer: Self-pay | Admitting: Family Medicine

## 2013-11-19 VITALS — BP 154/88 | HR 80 | Temp 97.5°F | Ht 64.0 in | Wt 273.4 lb

## 2013-11-19 DIAGNOSIS — R05 Cough: Secondary | ICD-10-CM

## 2013-11-19 DIAGNOSIS — I5032 Chronic diastolic (congestive) heart failure: Secondary | ICD-10-CM

## 2013-11-19 DIAGNOSIS — R059 Cough, unspecified: Secondary | ICD-10-CM

## 2013-11-19 DIAGNOSIS — J069 Acute upper respiratory infection, unspecified: Secondary | ICD-10-CM

## 2013-11-19 DIAGNOSIS — I4891 Unspecified atrial fibrillation: Secondary | ICD-10-CM

## 2013-11-19 MED ORDER — AZITHROMYCIN 250 MG PO TABS: ORAL_TABLET | ORAL | Status: DC

## 2013-11-19 MED ORDER — BENZONATATE 100 MG PO CAPS: 100.0000 mg | ORAL_CAPSULE | Freq: Three times a day (TID) | ORAL | Status: DC | PRN

## 2013-11-19 NOTE — Progress Notes (Signed)
   Subjective:    Patient ID: William Flores, male    DOB: 1950-06-07, 62 y.o.   MRN: [id number]  HPI Patient is here for disability paperwork.  He can walk 50 yards before getting SOB and stopping.  He can lift max of 50 pounds before becoming SOB.  He cannot climb.  He can stand 15 to 20 minutes before having to sit down and rest.  He has difficulty lifting any objects above his head.  He cannot be ingaged in strenuos activity.  He has a job as Programmer, applications.  He has pain in his right shoulder from a chronic arthritic condition in his right shoulder.  He Is able to do his job duties as long as he takes his time and doesn't require any further work breaks than he is already taking.  He is doing short term disability for work and plans on getting back on full time when he gets his energy and physical condition back. He was recently hospitalized for acute on chronic diastolic CHF.  He Has atrial fibrillation and his rate is controlled better with cardizem 360.  Patient is here for c/o cough and uri sx's.   Review of Systems  Constitutional: Negative for fever.  HENT: Negative for ear pain.   Eyes: Negative for discharge.  Respiratory: Negative for cough.   Cardiovascular: Negative for chest pain.  Gastrointestinal: Negative for abdominal distention.  Endocrine: Negative for polyuria.  Genitourinary: Negative for difficulty urinating.  Musculoskeletal: Negative for gait problem and neck pain.  Skin: Negative for color change and rash.  Neurological: Negative for speech difficulty and headaches.  Psychiatric/Behavioral: Negative for agitation.       Objective:    BP 154/88 mmHg  Pulse 80  Temp(Src) 97.5 F (36.4 C) (Oral)  Ht 5' 4"  (1.626 m)  Wt 273 lb 6.4 oz (124.013 kg)  BMI 46.91 kg/m2   Physical Exam  Constitutional: He is oriented to person, place, and time. He appears well-developed and well-nourished.  HENT:  Head: Normocephalic and atraumatic.  Mouth/Throat: Oropharynx  is clear and moist.  Eyes: Pupils are equal, round, and reactive to light.  Neck: Normal range of motion. Neck supple.  Cardiovascular: Normal rate and regular rhythm.   No murmur heard. Pulmonary/Chest: Effort normal and breath sounds normal.  Abdominal: Soft. Bowel sounds are normal. There is no tenderness.  Neurological: He is alert and oriented to person, place, and time.  Skin: Skin is warm and dry.  Psychiatric: He has a normal mood and affect.          Assessment & Plan:     ICD-9-CM ICD-10-CM   1. Atrial fibrillation, unspecified 427.31 I48.91   2. Chronic diastolic CHF (congestive heart failure) 428.32 I50.32    428.0    3. Cough 786.2 R05 benzonatate (TESSALON) 100 MG capsule     azithromycin (ZITHROMAX) 250 MG tablet  4. URI (upper respiratory infection) 465.9 J06.9 benzonatate (TESSALON) 100 MG capsule     azithromycin (ZITHROMAX) 250 MG tablet     Return if symptoms worsen or fail to improve.  Lysbeth Penner FNP

## 2013-11-20 ENCOUNTER — Encounter: Payer: Self-pay | Admitting: *Deleted

## 2013-11-20 NOTE — Telephone Encounter (Signed)
Tried to call patient - LMOVM

## 2013-11-21 ENCOUNTER — Ambulatory Visit (INDEPENDENT_AMBULATORY_CARE_PROVIDER_SITE_OTHER): Payer: BC Managed Care – PPO | Admitting: Cardiology

## 2013-11-21 ENCOUNTER — Encounter: Payer: Self-pay | Admitting: Cardiology

## 2013-11-21 VITALS — BP 150/89 | HR 93 | Ht 64.0 in | Wt 273.9 lb

## 2013-11-21 DIAGNOSIS — I5032 Chronic diastolic (congestive) heart failure: Secondary | ICD-10-CM

## 2013-11-21 NOTE — Progress Notes (Signed)
11/21/2013 William Flores   May 14, 1950  [id number]  Primary Physician Redge Gainer, MD Primary Cardiologist: Dr. [name]  HPI:  The patient is a 63 year old male, who persisted clinic for post hospital follow-up. He is followed by Dr. [name]. His past medical history is significant for atrial fibrillation on chronic Coumadin, diastolic heart failure and diabetes mellitus.  He presented to the [facility] emergency room on 10/26 with progressively worsening shortness of breath for x 3-4 weeks, productive cough and 10 pound weight gain. He was admitted to the hospitalist service for bronchitis plus acute on chronic diastolic heart failure.He was placed on IV diuretic therapy and had good diuresis. His discharge weight was 279 pounds. He was discharged home on 80 mg of Lasix twice a day as well as 2.5 mg of Zaroxolyn every other day. During his hospitalization, he had issues with hypotension. Subsequently, his bisoprolol and lisinopril were both held at time of discharge.  In follow-up, he states that he has been doing well since discharge. He denies any recurrent dyspnea, lower extremity edema, orthopnea or PND. No chest discomfort. He denies any palpitations, syncope/near-syncope, lightheadedness or dizziness. He reports full medication compliance and adherence to a low sodium diet. He's been compliant with daily weights. His weight today in clinic is 273 pounds (was 279 at time of discharge). Blood pressure today in clinic is 150/89.   Current Outpatient Prescriptions  Medication Sig Dispense Refill  . allopurinol (ZYLOPRIM) 100 MG tablet Take 100 mg by mouth daily. Patient takes with 100 mg to equal 400 mg    . allopurinol (ZYLOPRIM) 300 MG tablet Take 300 mg by mouth daily. Patient takes with 300 mg to equal 400 mg dose    . aspirin 81 MG tablet Take 81 mg by mouth daily.    Marland Kitchen atorvastatin (LIPITOR) 20 MG tablet Take 20 mg by mouth daily.    Marland Kitchen azelastine (OPTIVAR) 0.05 % ophthalmic solution Place 2  drops into both eyes 2 (two) times daily. 6 mL 12  . azithromycin (ZITHROMAX) 250 MG tablet Take 2 po first day and then one po qd x 4 days 6 tablet 0  . benzonatate (TESSALON) 100 MG capsule Take 1 capsule (100 mg total) by mouth 3 (three) times daily as needed for cough. 20 capsule 1  . chlorpheniramine-HYDROcodone (TUSSIONEX) 10-8 MG/5ML LQCR Take 5 mLs by mouth every 12 (twelve) hours as needed for cough. 1 Bottle 0  . diltiazem (CARDIZEM CD) 360 MG 24 hr capsule Take 1 capsule (360 mg total) by mouth daily. 30 capsule 0  . furosemide (LASIX) 40 MG tablet TAKE 1 TABLET TWICE A DAY 60 tablet 3  . glucose blood test strip Use to check BG daily 100 each 2  . metolazone (ZAROXOLYN) 2.5 MG tablet Take 1 tablet (2.5 mg total) by mouth daily. 30 tablet 1  . omeprazole (PRILOSEC) 20 MG capsule Take 1 capsule (20 mg total) by mouth daily. 30 capsule 3  . ONETOUCH DELICA LANCETS 40C MISC 1 each by Does not apply route daily. Use one lancet to check BG daily 100 each 2  . sitaGLIPtin-metformin (JANUMET) 50-1000 MG per tablet Take 1 tablet by mouth 2 (two) times daily with a meal. 60 tablet 3  . warfarin (COUMADIN) 5 MG tablet Take 7.5-10 mg by mouth See admin instructions. Patient takes 2 ( 5 mg ) for 10 mg on Monday and Friday and all the rest of the day patient takes 7.5 mg ( 1.5 tabs)    .  bisoprolol (ZEBETA) 5 MG tablet Take 0.5 tablets (2.5 mg total) by mouth daily. (Patient taking differently: Take 2.5 mg by mouth daily. ON HOLD) 30 tablet 1  . lisinopril (PRINIVIL,ZESTRIL) 20 MG tablet Take 20 mg by mouth daily. ON HOLD    . [DISCONTINUED] diltiazem (TIAZAC) 240 MG 24 hr capsule TAKE 1 CAPSULE DAILY 30 capsule 3   No current facility-administered medications for this visit.    Allergies  Allergen Reactions  . Codeine     Headache     History   Social History  . Marital Status: Divorced    Spouse Name: N/A    Number of Children: 1  . Years of Education: N/A   Occupational History  .   [employer] programmer   Social History Main Topics  . Smoking status: Former Smoker    Quit date: 08/06/1983  . Smokeless tobacco: Never Used  . Alcohol Use: No  . Drug Use: No  . Sexual Activity: Not on file   Other Topics Concern  . Not on file   Social History Narrative     Review of Systems: General: negative for chills, fever, night sweats or weight changes.  Cardiovascular: negative for chest pain, dyspnea on exertion, edema, orthopnea, palpitations, paroxysmal nocturnal dyspnea or shortness of breath Dermatological: negative for rash Respiratory: negative for cough or wheezing Urologic: negative for hematuria Abdominal: negative for nausea, vomiting, diarrhea, bright red blood per rectum, melena, or hematemesis Neurologic: negative for visual changes, syncope, or dizziness All other systems reviewed and are otherwise negative except as noted above.    Blood pressure 150/89, pulse 93, height 5' 4"  (1.626 m), weight 273 lb 14.4 oz (124.24 kg).  General appearance: alert, cooperative and no distress Neck: no carotid bruit and no JVD Lungs: clear to auscultation bilaterally Heart: irregularly irregular rhythm Extremities: no LEE Pulses: 2+ and symmetric Skin: warm and dry Neurologic: Grossly normal  EKG Not performed  ASSESSMENT AND PLAN:   1. Chronic Diastolic CHF: Evolemic on phyiscal exam. Denies any further dyspnea. Continue daily lasix and metolazone every other day. Continue daily weights and low sodium diet.   2. HTN: Had issues with hypotension in hospital. BP today is 150/89. Will resume bisoprolol today as this will also help lower HR (has chronic Afib). He has an appt with PCP next week. Can resume ACE-I if BP is stable  3. Chronic Atrial Fibrillation: Rate controlled but in the 90s resting. Asymptomatic. Continue Cardizem. Resume bisoprolol.   4. Chronic Anticoagulation: on Warfarin for chronic afib. INR checked by PCP. Denies abnormal bleeding and  falls  PLAN  Continue current medication regimen. Resume BB for afib and BP. F/U with Dr. [name] in 6 months.   William Flores, BRITTAINYPA-C 11/21/2013 2:29 PM

## 2013-11-21 NOTE — Patient Instructions (Signed)
Your physician recommends that you schedule a follow-up appointment in: 6 Months with Dr [name]

## 2013-11-29 ENCOUNTER — Other Ambulatory Visit: Payer: Self-pay | Admitting: Pharmacist

## 2013-11-29 ENCOUNTER — Ambulatory Visit (INDEPENDENT_AMBULATORY_CARE_PROVIDER_SITE_OTHER): Payer: BC Managed Care – PPO | Admitting: Pharmacist

## 2013-11-29 VITALS — BP 112/70 | HR 64 | Ht 64.0 in | Wt 282.0 lb

## 2013-11-29 DIAGNOSIS — I4891 Unspecified atrial fibrillation: Secondary | ICD-10-CM

## 2013-11-29 LAB — POCT INR: INR: 2.2

## 2013-11-29 MED ORDER — WARFARIN SODIUM 5 MG PO TABS: ORAL_TABLET | ORAL | Status: DC

## 2013-11-29 MED ORDER — WARFARIN SODIUM 5 MG PO TABS: 7.5000 mg | ORAL_TABLET | ORAL | Status: DC

## 2013-11-29 NOTE — Patient Instructions (Signed)
Anticoagulation Dose Instructions as of 11/29/2013      Dorene Grebe Tue Wed Thu Fri Sat   New Dose 7.5 mg 10 mg 7.5 mg 7.5 mg 7.5 mg 10 mg 7.5 mg    Description        Continue 66m Mondays and Fridays and 7.552mall other days       INR was 2.2 today

## 2013-12-01 ENCOUNTER — Other Ambulatory Visit: Payer: Self-pay | Admitting: Family Medicine

## 2013-12-03 ENCOUNTER — Other Ambulatory Visit: Payer: Self-pay | Admitting: Family Medicine

## 2013-12-03 ENCOUNTER — Telehealth: Payer: Self-pay | Admitting: Family Medicine

## 2013-12-03 NOTE — Telephone Encounter (Signed)
William Flores can you help him out with this

## 2013-12-04 ENCOUNTER — Telehealth: Payer: Self-pay | Admitting: Cardiology

## 2013-12-04 NOTE — Telephone Encounter (Signed)
Pt says he need a note for work stating that he is still under the doctor care from 11-10- until further notice.Please fax to UDA:[phone].Please do this asap.

## 2013-12-04 NOTE — Telephone Encounter (Signed)
Pt had called regarding work note. Reviewed chart, has been followed by Lyda Jester PA-C most recently and is instructed to F/U with Dr. [name] in 6 months. No answer at home, left message for patient to call back.

## 2013-12-05 NOTE — Telephone Encounter (Signed)
Called pt, told will need to verify out-of-work status with PCP. No apparent restrictions placed based on last cardiology OV. Will forward to [employer] for review.

## 2013-12-05 NOTE — Telephone Encounter (Signed)
Spoke with pt this AM, has requested documentation of being under physician's care from "11-10 until further notice". Saw that this pt has a recommended F/U with Dr. [name] in 6 months; no specific instructions for activity/work. Pt chart shows recommended by PCP on 11/17 to stay out of work for the next few days. Has also requested documents from PCP.

## 2013-12-11 ENCOUNTER — Ambulatory Visit: Payer: BC Managed Care – PPO | Admitting: Family Medicine

## 2013-12-17 ENCOUNTER — Other Ambulatory Visit: Payer: Self-pay | Admitting: Family Medicine

## 2013-12-19 ENCOUNTER — Encounter: Payer: Self-pay | Admitting: Cardiology

## 2013-12-19 ENCOUNTER — Encounter: Payer: Self-pay | Admitting: Cardiovascular Disease

## 2013-12-19 ENCOUNTER — Ambulatory Visit (INDEPENDENT_AMBULATORY_CARE_PROVIDER_SITE_OTHER): Payer: BC Managed Care – PPO | Admitting: Cardiology

## 2013-12-19 VITALS — BP 120/62 | HR 76 | Ht 64.0 in | Wt 284.0 lb

## 2013-12-19 DIAGNOSIS — I5032 Chronic diastolic (congestive) heart failure: Secondary | ICD-10-CM

## 2013-12-19 NOTE — Patient Instructions (Signed)
Your physician wants you to follow-up in 6 months with Dr. [name]. You will receive a reminder letter in the mail 2 months in advance. If you do not receive a letter, please call our office to schedule the follow-up appointment.

## 2013-12-19 NOTE — Progress Notes (Signed)
12/19/2013 William Flores   Jul 28, 1950  [id number]  Primary Physician Redge Gainer, MD Primary Cardiologist: Dr. [name]  HPI:  The patient is a 63 year old male, who persisted clinic for post hospital follow-up. He is followed by Dr. [name]. His past medical history is significant for atrial fibrillation on chronic Coumadin, diastolic heart failure and diabetes mellitus. He presented to the [facility] emergency room on 10/26 with progressively worsening shortness of breath for x 3-4 weeks, productive cough and 10 pound weight gain. He was admitted to the hospitalist service for bronchitis plus acute on chronic diastolic heart failure.He was placed on IV diuretic therapy and had good diuresis. His discharge weight was 279 pounds. He was discharged home on 80 mg of Lasix twice a day as well as 2.5 mg of Zaroxolyn every other day. During his hospitalization, he had issues with hypotension. Subsequently, his bisoprolol and lisinopril were both held at time of discharge.  I recently evaluated him in clinic 11/21/13 for follow-up.  He was felt to be stable from a cardiac standpoint. We resumed he bisoprolol as his BP was mildly elevated. His PCP has since resumed his lisinopril. I instructed him to return in 6 months to see Dr. [name]. However, he presents back to clinic today, 3 weeks later, for repeat evaluation. He has applied for work disability and requires repeat cardiovascular exam.   Current Outpatient Prescriptions  Medication Sig Dispense Refill  . allopurinol (ZYLOPRIM) 100 MG tablet Take 100 mg by mouth daily. Patient takes with 100 mg to equal 400 mg    . allopurinol (ZYLOPRIM) 300 MG tablet Take 300 mg by mouth daily. Patient takes with 300 mg to equal 400 mg dose    . aspirin 81 MG tablet Take 81 mg by mouth daily.    Marland Kitchen atorvastatin (LIPITOR) 20 MG tablet Take 20 mg by mouth daily.    Marland Kitchen azelastine (OPTIVAR) 0.05 % ophthalmic solution Place 2 drops into both eyes 2 (two) times daily. 6 mL 12    . benzonatate (TESSALON) 100 MG capsule Take 1 capsule (100 mg total) by mouth 3 (three) times daily as needed for cough. 20 capsule 1  . bisoprolol (ZEBETA) 5 MG tablet Take 0.5 tablets (2.5 mg total) by mouth daily. 30 tablet 1  . chlorpheniramine-HYDROcodone (TUSSIONEX) 10-8 MG/5ML LQCR Take 5 mLs by mouth every 12 (twelve) hours as needed for cough. 1 Bottle 0  . diltiazem (CARDIZEM CD) 360 MG 24 hr capsule TAKE 1 CAPSULE (360 MG TOTAL) BY MOUTH DAILY. 30 capsule 5  . furosemide (LASIX) 80 MG tablet Take 80 mg by mouth 2 (two) times daily.    Marland Kitchen glucose blood test strip Use to check BG daily 100 each 2  . lisinopril (PRINIVIL,ZESTRIL) 20 MG tablet TAKE 1 TABLET (20 MG TOTAL) BY MOUTH DAILY. 30 tablet 4  . metolazone (ZAROXOLYN) 2.5 MG tablet Take 1 tablet (2.5 mg total) by mouth daily. (Patient taking differently: Take 2.5 mg by mouth daily. Take Monday,Wednesday and Friday) 30 tablet 1  . omeprazole (PRILOSEC) 20 MG capsule TAKE 1 CAPSULE (20 MG TOTAL) BY MOUTH DAILY. 30 capsule 4  . ONETOUCH DELICA LANCETS 45W MISC 1 each by Does not apply route daily. Use one lancet to check BG daily 100 each 2  . sitaGLIPtin-metformin (JANUMET) 50-1000 MG per tablet Take 1 tablet by mouth 2 (two) times daily with a meal. 60 tablet 3  . warfarin (COUMADIN) 5 MG tablet As directed by anticoagulation clinic.  #100 =  90 day supply 100 tablet 1  . [DISCONTINUED] diltiazem (TIAZAC) 240 MG 24 hr capsule TAKE 1 CAPSULE DAILY 30 capsule 3   No current facility-administered medications for this visit.    Allergies  Allergen Reactions  . Codeine     Headache     History   Social History  . Marital Status: Divorced    Spouse Name: N/A    Number of Children: 1  . 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 Smoking status: Former Smoker    Quit date: 08/06/1983  . Smokeless tobacco: Never Used  . Alcohol Use: No  . Drug Use: No  . Sexual  Activity: Not on file   Other Topics Concern  . Not on file   Social History Narrative     Review of Systems: General: negative for chills, fever, night sweats or weight changes.  Cardiovascular: negative for chest pain, dyspnea on exertion, edema, orthopnea, palpitations, paroxysmal nocturnal dyspnea or shortness of breath Dermatological: negative for rash Respiratory: negative for cough or wheezing Urologic: negative for hematuria Abdominal: negative for nausea, vomiting, diarrhea, bright red blood per rectum, melena, or hematemesis Neurologic: negative for visual changes, syncope, or dizziness All other systems reviewed and are otherwise negative except as noted above.    Blood pressure 120/62, pulse 76, height 5' 4"  (1.626 m), weight 284 lb (128.822 kg).  General appearance: alert, cooperative and no distress Neck: no carotid bruit and no JVD Lungs: clear to auscultation bilaterally Heart: irregularly irregular rhythm Extremities: no LEE Pulses: 2+ and symmetric Skin: warm and dry Neurologic: Grossly normal  EKG Not performed  ASSESSMENT AND PLAN:   1. Chronic Diastolic CHF: Evolemic on phyiscal exam. Denies any further dyspnea. Continue daily lasix and metolazone every other day. Continue daily weights and low sodium diet.   2. HTN: BP stable at 120/62. Continue current medical regimen.  3. Chronic Atrial Fibrillation: Rate controlled but in the 70s resting. Asymptomatic. Continue Cardizem and bisoprolol.   4. Chronic Anticoagulation: on Warfarin for chronic afib. INR checked by PCP. Denies abnormal bleeding and falls  PLAN  Follow-up with Dr. [name] in 6 months.  SIMMONS, BRITTAINYPA-C 12/19/2013 3:56 PM

## 2013-12-24 ENCOUNTER — Other Ambulatory Visit: Payer: Self-pay | Admitting: Family Medicine

## 2013-12-25 ENCOUNTER — Encounter: Payer: Self-pay | Admitting: Cardiovascular Disease

## 2013-12-25 NOTE — Telephone Encounter (Signed)
LM refill given on Tessalon.

## 2014-01-01 ENCOUNTER — Other Ambulatory Visit: Payer: Self-pay | Admitting: Family Medicine

## 2014-01-15 ENCOUNTER — Other Ambulatory Visit: Payer: Self-pay | Admitting: Family Medicine

## 2014-01-16 ENCOUNTER — Other Ambulatory Visit: Payer: Self-pay | Admitting: Pharmacist

## 2014-01-16 ENCOUNTER — Ambulatory Visit (INDEPENDENT_AMBULATORY_CARE_PROVIDER_SITE_OTHER): Payer: BLUE CROSS/BLUE SHIELD | Admitting: Pharmacist

## 2014-01-16 DIAGNOSIS — R7989 Other specified abnormal findings of blood chemistry: Secondary | ICD-10-CM

## 2014-01-16 DIAGNOSIS — D649 Anemia, unspecified: Secondary | ICD-10-CM

## 2014-01-16 DIAGNOSIS — I4891 Unspecified atrial fibrillation: Secondary | ICD-10-CM

## 2014-01-16 DIAGNOSIS — R748 Abnormal levels of other serum enzymes: Secondary | ICD-10-CM

## 2014-01-16 LAB — POCT CBC
GRANULOCYTE PERCENT: 84.2 % — AB (ref 37–80)
HCT, POC: 35.3 % — AB (ref 43.5–53.7)
HEMOGLOBIN: 10.4 g/dL — AB (ref 14.1–18.1)
Lymph, poc: 0.9 (ref 0.6–3.4)
MCH: 23.5 pg — AB (ref 27–31.2)
MCHC: 29.3 g/dL — AB (ref 31.8–35.4)
MCV: 80 fL (ref 80–97)
MPV: 7.3 fL (ref 0–99.8)
POC GRANULOCYTE: 6.2 (ref 2–6.9)
POC LYMPH PERCENT: 11.8 %L (ref 10–50)
Platelet Count, POC: 276 10*3/uL (ref 142–424)
RBC: 4.4 M/uL — AB (ref 4.69–6.13)
RDW, POC: 23.5 %
WBC: 7.4 10*3/uL (ref 4.6–10.2)

## 2014-01-16 LAB — POCT INR: INR: 1.8

## 2014-01-16 MED ORDER — FUROSEMIDE 80 MG PO TABS: ORAL_TABLET | ORAL | Status: DC

## 2014-01-16 NOTE — Patient Instructions (Addendum)
Anticoagulation Dose Instructions as of 01/16/2014      William Flores Tue Wed Thu Fri Sat   New Dose 7.5 mg 10 mg 7.5 mg 7.5 mg 7.5 mg 10 mg 7.5 mg    Description        Take 684m of warfarin today, then continue 1584mMondays and Fridays and 7.84m40mll other days      INR was 1.8 today

## 2014-01-16 NOTE — Progress Notes (Signed)
Subjective:     Indication: atrial fibrillation Bleeding signs/symptoms: None Thromboembolic signs/symptoms: None  Missed Coumadin doses: This week - missed 1 dose abotu 5 days ago Medication changes: no Dietary changes: no Bacterial/viral infection: no Other concerns: yes - 1+ pitting edema bilaterally.  Patient reports that he feels that he is retaining fluid a little but is having trouble sleeping due to taking fluid pill at night.   Patient was suppose to see PCP in December but missed appt  The following portions of the patient's history were reviewed and updated as appropriate: allergies, current medications, past family history, past medical history, past social history, past surgical history and problem list.  Review of Systems Cardiovascular: negative except for fatigue and lower extremity edema   Objective:    INR Today: 1.8  Current dose: warfarin 67m 1 and 1/2 tablet daily except 2 tablets on Mondays and Fridays     HBG = 11.0 (11/13/2013) Serum creatinine = 1.62 (11/13/2013)   Assessment:    Subtherapeutic INR for goal of 2-3   Edema Elevated serum creatinine Low hemoglobin  Plan:    1. New dose: take 151mof warfarin today, then restart regular dose of  warfarin 37m59m and 1/2 tablet daily except 2 tablets on Mondays and Fridays     2. Increase furosemide to 30m33mm and 40 mg qpm 3.   Orders Placed This Encounter  Procedures  . BMP8+EGFR  . POCT CBC  4.  Next INR: 3 weeks   - made appt with PCP   TammCherre RobinsarmD, CPP

## 2014-01-17 LAB — BMP8+EGFR
BUN/Creatinine Ratio: 22 (ref 10–22)
BUN: 25 mg/dL (ref 8–27)
CALCIUM: 9.1 mg/dL (ref 8.6–10.2)
CHLORIDE: 97 mmol/L (ref 97–108)
CO2: 30 mmol/L — ABNORMAL HIGH (ref 18–29)
CREATININE: 1.15 mg/dL (ref 0.76–1.27)
GFR calc Af Amer: 79 mL/min/{1.73_m2} (ref >59)
GFR calc non Af Amer: 68 mL/min/{1.73_m2} (ref >59)
Glucose: 198 mg/dL — ABNORMAL HIGH (ref 65–99)
POTASSIUM: 3.9 mmol/L (ref 3.5–5.2)
SODIUM: 142 mmol/L (ref 134–144)

## 2014-01-18 LAB — FE+TIBC+FER+B12+FOLIC
Ferritin: 43 ng/mL (ref 30–400)
Folate: 12.9 ng/mL (ref >3.0)
IRON: 25 ug/dL — AB (ref 40–155)
Iron Saturation: 6 % — CL (ref 15–55)
TIBC: 405 ug/dL (ref 250–450)
UIBC: 380 ug/dL — ABNORMAL HIGH (ref 150–375)
VITAMIN B 12: 427 pg/mL (ref 211–946)

## 2014-01-18 LAB — SPECIMEN STATUS REPORT

## 2014-01-25 ENCOUNTER — Other Ambulatory Visit: Payer: Self-pay

## 2014-01-25 ENCOUNTER — Other Ambulatory Visit: Payer: Self-pay | Admitting: Family Medicine

## 2014-01-26 ENCOUNTER — Other Ambulatory Visit: Payer: Self-pay | Admitting: Family Medicine

## 2014-02-03 ENCOUNTER — Telehealth: Payer: Self-pay | Admitting: Pharmacist

## 2014-02-03 NOTE — Telephone Encounter (Signed)
Patient's iron low / iron sat low.  Start iron supplement 354m qd. Lat colonoscopy 09/2012 - 1 polyp removed which was ademona.  Due repeat colonoscopy 09/2017. Needs FOBT - test was left at front desk 2 weeks ago - check to see if patient has return

## 2014-02-05 ENCOUNTER — Other Ambulatory Visit: Payer: Self-pay | Admitting: Pharmacist

## 2014-02-05 DIAGNOSIS — D649 Anemia, unspecified: Secondary | ICD-10-CM

## 2014-02-05 DIAGNOSIS — Z85038 Personal history of other malignant neoplasm of large intestine: Secondary | ICD-10-CM

## 2014-02-05 DIAGNOSIS — K921 Melena: Secondary | ICD-10-CM

## 2014-02-05 LAB — SPECIMEN STATUS REPORT

## 2014-02-05 LAB — FECAL OCCULT BLOOD, IMMUNOCHEMICAL: Fecal Occult Bld: POSITIVE — AB

## 2014-02-05 MED ORDER — FERROUS SULFATE 325 (65 FE) MG PO TABS: 325.0000 mg | ORAL_TABLET | Freq: Every day | ORAL | Status: DC

## 2014-02-05 MED ORDER — BENZONATATE 100 MG PO CAPS: 100.0000 mg | ORAL_CAPSULE | Freq: Three times a day (TID) | ORAL | Status: DC | PRN

## 2014-02-05 NOTE — Telephone Encounter (Signed)
Patient called with results.  He did complete FOBT but he mailed directly to [facility].  I spoke to [place] in our lab and she is checking on results.    Patient will start ferrous sulfate.   He also asked if he could get refill for benzonatate for cough at hs.  Discussed with Dr Livia Snellen whom the patient will see 02/10/14 and Dr Livia Snellen was ok with refill of benzonatate.

## 2014-02-10 ENCOUNTER — Encounter: Payer: Self-pay | Admitting: Family Medicine

## 2014-02-10 ENCOUNTER — Ambulatory Visit (INDEPENDENT_AMBULATORY_CARE_PROVIDER_SITE_OTHER): Payer: BLUE CROSS/BLUE SHIELD | Admitting: Family Medicine

## 2014-02-10 ENCOUNTER — Encounter (HOSPITAL_COMMUNITY): Payer: Self-pay | Admitting: Emergency Medicine

## 2014-02-10 ENCOUNTER — Emergency Department (HOSPITAL_COMMUNITY): Payer: BLUE CROSS/BLUE SHIELD

## 2014-02-10 ENCOUNTER — Inpatient Hospital Stay (HOSPITAL_COMMUNITY)
Admission: EM | Admit: 2014-02-10 | Discharge: 2014-02-26 | DRG: 291 | Disposition: A | Discharge status: Skilled Nursing Facility | Payer: BLUE CROSS/BLUE SHIELD | Attending: Internal Medicine | Admitting: Internal Medicine

## 2014-02-10 ENCOUNTER — Ambulatory Visit (INDEPENDENT_AMBULATORY_CARE_PROVIDER_SITE_OTHER): Payer: BLUE CROSS/BLUE SHIELD

## 2014-02-10 VITALS — BP 107/66 | HR 100 | Temp 97.2°F | Ht 64.0 in | Wt 294.8 lb

## 2014-02-10 DIAGNOSIS — G4733 Obstructive sleep apnea (adult) (pediatric): Secondary | ICD-10-CM

## 2014-02-10 DIAGNOSIS — J9600 Acute respiratory failure, unspecified whether with hypoxia or hypercapnia: Secondary | ICD-10-CM

## 2014-02-10 DIAGNOSIS — M109 Gout, unspecified: Secondary | ICD-10-CM | POA: Diagnosis present

## 2014-02-10 DIAGNOSIS — I5043 Acute on chronic combined systolic (congestive) and diastolic (congestive) heart failure: Secondary | ICD-10-CM | POA: Diagnosis present

## 2014-02-10 DIAGNOSIS — E876 Hypokalemia: Secondary | ICD-10-CM | POA: Diagnosis present

## 2014-02-10 DIAGNOSIS — I5033 Acute on chronic diastolic (congestive) heart failure: Secondary | ICD-10-CM

## 2014-02-10 DIAGNOSIS — E1165 Type 2 diabetes mellitus with hyperglycemia: Secondary | ICD-10-CM | POA: Diagnosis present

## 2014-02-10 DIAGNOSIS — E1129 Type 2 diabetes mellitus with other diabetic kidney complication: Secondary | ICD-10-CM

## 2014-02-10 DIAGNOSIS — Z885 Allergy status to narcotic agent status: Secondary | ICD-10-CM

## 2014-02-10 DIAGNOSIS — J9690 Respiratory failure, unspecified, unspecified whether with hypoxia or hypercapnia: Secondary | ICD-10-CM

## 2014-02-10 DIAGNOSIS — I429 Cardiomyopathy, unspecified: Secondary | ICD-10-CM | POA: Diagnosis present

## 2014-02-10 DIAGNOSIS — J9602 Acute respiratory failure with hypercapnia: Secondary | ICD-10-CM | POA: Diagnosis present

## 2014-02-10 DIAGNOSIS — E875 Hyperkalemia: Secondary | ICD-10-CM | POA: Diagnosis present

## 2014-02-10 DIAGNOSIS — Z6841 Body Mass Index (BMI) 40.0 and over, adult: Secondary | ICD-10-CM | POA: Diagnosis not present

## 2014-02-10 DIAGNOSIS — I5023 Acute on chronic systolic (congestive) heart failure: Secondary | ICD-10-CM | POA: Diagnosis present

## 2014-02-10 DIAGNOSIS — Z7982 Long term (current) use of aspirin: Secondary | ICD-10-CM | POA: Diagnosis not present

## 2014-02-10 DIAGNOSIS — N183 Chronic kidney disease, stage 3 (moderate): Secondary | ICD-10-CM | POA: Diagnosis present

## 2014-02-10 DIAGNOSIS — I428 Other cardiomyopathies: Secondary | ICD-10-CM

## 2014-02-10 DIAGNOSIS — I50810 Right heart failure, unspecified: Secondary | ICD-10-CM

## 2014-02-10 DIAGNOSIS — Z7901 Long term (current) use of anticoagulants: Secondary | ICD-10-CM

## 2014-02-10 DIAGNOSIS — N179 Acute kidney failure, unspecified: Secondary | ICD-10-CM | POA: Diagnosis present

## 2014-02-10 DIAGNOSIS — J15211 Pneumonia due to Methicillin susceptible Staphylococcus aureus: Secondary | ICD-10-CM | POA: Diagnosis present

## 2014-02-10 DIAGNOSIS — Z01818 Encounter for other preprocedural examination: Secondary | ICD-10-CM | POA: Insufficient documentation

## 2014-02-10 DIAGNOSIS — R0602 Shortness of breath: Secondary | ICD-10-CM | POA: Diagnosis present

## 2014-02-10 DIAGNOSIS — J9601 Acute respiratory failure with hypoxia: Secondary | ICD-10-CM | POA: Diagnosis present

## 2014-02-10 DIAGNOSIS — I481 Persistent atrial fibrillation: Secondary | ICD-10-CM | POA: Diagnosis present

## 2014-02-10 DIAGNOSIS — Z85038 Personal history of other malignant neoplasm of large intestine: Secondary | ICD-10-CM

## 2014-02-10 DIAGNOSIS — D500 Iron deficiency anemia secondary to blood loss (chronic): Secondary | ICD-10-CM | POA: Diagnosis present

## 2014-02-10 DIAGNOSIS — E662 Morbid (severe) obesity with alveolar hypoventilation: Secondary | ICD-10-CM | POA: Diagnosis present

## 2014-02-10 DIAGNOSIS — Z87891 Personal history of nicotine dependence: Secondary | ICD-10-CM

## 2014-02-10 DIAGNOSIS — E870 Hyperosmolality and hypernatremia: Secondary | ICD-10-CM | POA: Diagnosis present

## 2014-02-10 DIAGNOSIS — D649 Anemia, unspecified: Secondary | ICD-10-CM | POA: Diagnosis present

## 2014-02-10 DIAGNOSIS — J189 Pneumonia, unspecified organism: Secondary | ICD-10-CM

## 2014-02-10 DIAGNOSIS — I129 Hypertensive chronic kidney disease with stage 1 through stage 4 chronic kidney disease, or unspecified chronic kidney disease: Secondary | ICD-10-CM | POA: Diagnosis present

## 2014-02-10 DIAGNOSIS — IMO0002 Reserved for concepts with insufficient information to code with codable children: Secondary | ICD-10-CM

## 2014-02-10 DIAGNOSIS — E1122 Type 2 diabetes mellitus with diabetic chronic kidney disease: Secondary | ICD-10-CM | POA: Diagnosis present

## 2014-02-10 DIAGNOSIS — Z9119 Patient's noncompliance with other medical treatment and regimen: Secondary | ICD-10-CM | POA: Diagnosis present

## 2014-02-10 DIAGNOSIS — E785 Hyperlipidemia, unspecified: Secondary | ICD-10-CM | POA: Diagnosis present

## 2014-02-10 DIAGNOSIS — Z978 Presence of other specified devices: Secondary | ICD-10-CM

## 2014-02-10 DIAGNOSIS — Z4659 Encounter for fitting and adjustment of other gastrointestinal appliance and device: Secondary | ICD-10-CM

## 2014-02-10 DIAGNOSIS — G9341 Metabolic encephalopathy: Secondary | ICD-10-CM | POA: Diagnosis present

## 2014-02-10 DIAGNOSIS — I50812 Chronic right heart failure: Secondary | ICD-10-CM

## 2014-02-10 DIAGNOSIS — I10 Essential (primary) hypertension: Secondary | ICD-10-CM | POA: Diagnosis not present

## 2014-02-10 DIAGNOSIS — I119 Hypertensive heart disease without heart failure: Secondary | ICD-10-CM | POA: Diagnosis present

## 2014-02-10 DIAGNOSIS — I482 Chronic atrial fibrillation: Secondary | ICD-10-CM | POA: Diagnosis not present

## 2014-02-10 LAB — POCT CBC
Granulocyte percent: 80.9 %G — AB (ref 37–80)
HEMATOCRIT: 38.3 % — AB (ref 43.5–53.7)
HEMOGLOBIN: 10.3 g/dL — AB (ref 14.1–18.1)
LYMPH, POC: 0.9 (ref 0.6–3.4)
MCH: 21.1 pg — AB (ref 27–31.2)
MCHC: 27 g/dL — AB (ref 31.8–35.4)
MCV: 78.2 fL — AB (ref 80–97)
MPV: 7 fL (ref 0–99.8)
PLATELET COUNT, POC: 305 10*3/uL (ref 142–424)
POC GRANULOCYTE: 5.6 (ref 2–6.9)
POC LYMPH PERCENT: 13.7 %L (ref 10–50)
RBC: 4.9 M/uL (ref 4.69–6.13)
RDW, POC: 24.2 %
WBC: 6.9 10*3/uL (ref 4.6–10.2)

## 2014-02-10 LAB — CBC WITH DIFFERENTIAL/PLATELET
Basophils Absolute: 0.1 10*3/uL (ref 0.0–0.1)
Basophils Relative: 1 % (ref 0–1)
EOS ABS: 0.1 10*3/uL (ref 0.0–0.7)
EOS PCT: 1 % (ref 0–5)
HCT: 36.8 % — ABNORMAL LOW (ref 39.0–52.0)
HEMOGLOBIN: 10.3 g/dL — AB (ref 13.0–17.0)
Lymphocytes Relative: 9 % — ABNORMAL LOW (ref 12–46)
Lymphs Abs: 0.6 10*3/uL — ABNORMAL LOW (ref 0.7–4.0)
MCH: 22.7 pg — ABNORMAL LOW (ref 26.0–34.0)
MCHC: 28 g/dL — AB (ref 30.0–36.0)
MCV: 81.2 fL (ref 78.0–100.0)
MONOS PCT: 9 % (ref 3–12)
Monocytes Absolute: 0.6 10*3/uL (ref 0.1–1.0)
Neutro Abs: 4.9 10*3/uL (ref 1.7–7.7)
Neutrophils Relative %: 80 % — ABNORMAL HIGH (ref 43–77)
PLATELETS: 237 10*3/uL (ref 150–400)
RBC: 4.53 MIL/uL (ref 4.22–5.81)
RDW: 22.3 % — ABNORMAL HIGH (ref 11.5–15.5)
WBC: 6.3 10*3/uL (ref 4.0–10.5)

## 2014-02-10 LAB — BASIC METABOLIC PANEL
Anion gap: 5 (ref 5–15)
BUN: 46 mg/dL — ABNORMAL HIGH (ref 6–23)
CO2: 37 mmol/L — AB (ref 19–32)
Calcium: 9.1 mg/dL (ref 8.4–10.5)
Chloride: 98 mmol/L (ref 96–112)
Creatinine, Ser: 1.58 mg/dL — ABNORMAL HIGH (ref 0.50–1.35)
GFR calc non Af Amer: 45 mL/min — ABNORMAL LOW (ref >90)
GFR, EST AFRICAN AMERICAN: 52 mL/min — AB (ref >90)
GLUCOSE: 146 mg/dL — AB (ref 70–99)
Potassium: 3.8 mmol/L (ref 3.5–5.1)
Sodium: 140 mmol/L (ref 135–145)

## 2014-02-10 LAB — TROPONIN I: Troponin I: <0.03 ng/mL (ref <0.031)

## 2014-02-10 LAB — BRAIN NATRIURETIC PEPTIDE: B Natriuretic Peptide: 200.8 pg/mL — ABNORMAL HIGH (ref 0.0–100.0)

## 2014-02-10 LAB — PROTIME-INR
INR: 2.19 — ABNORMAL HIGH (ref 0.00–1.49)
PROTHROMBIN TIME: 24.5 s — AB (ref 11.6–15.2)

## 2014-02-10 MED ORDER — PANTOPRAZOLE SODIUM 40 MG PO TBEC
40.0000 mg | DELAYED_RELEASE_TABLET | Freq: Every day | ORAL | Status: DC
  Administered 2014-02-11: 40 mg via ORAL
  Filled 2014-02-10: qty 1

## 2014-02-10 MED ORDER — BISOPROLOL FUMARATE 5 MG PO TABS
5.0000 mg | ORAL_TABLET | Freq: Every day | ORAL | Status: DC
  Administered 2014-02-11: 5 mg via ORAL
  Filled 2014-02-10 (×2): qty 1

## 2014-02-10 MED ORDER — SODIUM CHLORIDE 0.9 % IV SOLN
INTRAVENOUS | Status: DC
  Administered 2014-02-12: 11:00:00 via INTRAVENOUS

## 2014-02-10 MED ORDER — FUROSEMIDE 10 MG/ML IJ SOLN
80.0000 mg | Freq: Once | INTRAMUSCULAR | Status: AC
  Administered 2014-02-10: 80 mg via INTRAVENOUS
  Filled 2014-02-10: qty 8

## 2014-02-10 MED ORDER — INSULIN ASPART 100 UNIT/ML ~~LOC~~ SOLN
0.0000 [IU] | Freq: Three times a day (TID) | SUBCUTANEOUS | Status: DC
  Administered 2014-02-11 – 2014-02-12 (×4): 4 [IU] via SUBCUTANEOUS

## 2014-02-10 MED ORDER — MORPHINE SULFATE 2 MG/ML IJ SOLN: 1.0000 mg | INTRAMUSCULAR | Status: DC | PRN

## 2014-02-10 MED ORDER — POLYETHYLENE GLYCOL 3350 17 G PO PACK
17.0000 g | PACK | Freq: Every day | ORAL | Status: DC | PRN
Start: 2014-02-10 — End: 2014-02-14
  Filled 2014-02-10 (×2): qty 1

## 2014-02-10 MED ORDER — WARFARIN SODIUM 5 MG PO TABS: 5.0000 mg | ORAL_TABLET | Freq: Every day | ORAL | Status: DC

## 2014-02-10 MED ORDER — ACETAMINOPHEN 325 MG PO TABS: 650.0000 mg | ORAL_TABLET | Freq: Four times a day (QID) | ORAL | Status: DC | PRN

## 2014-02-10 MED ORDER — FUROSEMIDE 10 MG/ML IJ SOLN: 80.0000 mg | Freq: Three times a day (TID) | INTRAMUSCULAR | Status: DC

## 2014-02-10 MED ORDER — BENZONATATE 100 MG PO CAPS
100.0000 mg | ORAL_CAPSULE | Freq: Three times a day (TID) | ORAL | Status: DC | PRN
  Filled 2014-02-10: qty 1

## 2014-02-10 MED ORDER — INSULIN ASPART 100 UNIT/ML ~~LOC~~ SOLN: 0.0000 [IU] | Freq: Every day | SUBCUTANEOUS | Status: DC

## 2014-02-10 MED ORDER — WARFARIN - PHARMACIST DOSING INPATIENT: Freq: Every day | Status: DC

## 2014-02-10 MED ORDER — LISINOPRIL 20 MG PO TABS
20.0000 mg | ORAL_TABLET | Freq: Every day | ORAL | Status: DC
  Administered 2014-02-11: 20 mg via ORAL
  Filled 2014-02-10: qty 1

## 2014-02-10 MED ORDER — POTASSIUM CHLORIDE CRYS ER 20 MEQ PO TBCR
20.0000 meq | EXTENDED_RELEASE_TABLET | Freq: Two times a day (BID) | ORAL | Status: DC
Start: 2014-02-10 — End: 2014-02-12
  Administered 2014-02-10 – 2014-02-11 (×3): 20 meq via ORAL
  Filled 2014-02-10 (×5): qty 1

## 2014-02-10 MED ORDER — HEPARIN SODIUM (PORCINE) 5000 UNIT/ML IJ SOLN
5000.0000 [IU] | Freq: Three times a day (TID) | INTRAMUSCULAR | Status: DC
  Administered 2014-02-10 – 2014-02-11 (×2): 5000 [IU] via SUBCUTANEOUS
  Filled 2014-02-10 (×4): qty 1

## 2014-02-10 MED ORDER — KETOTIFEN FUMARATE 0.025 % OP SOLN
1.0000 [drp] | Freq: Two times a day (BID) | OPHTHALMIC | Status: DC
Start: 2014-02-10 — End: 2014-02-26
  Administered 2014-02-10 – 2014-02-26 (×30): 1 [drp] via OPHTHALMIC
  Filled 2014-02-10 (×4): qty 5

## 2014-02-10 MED ORDER — OXYCODONE HCL 5 MG PO TABS: 5.0000 mg | ORAL_TABLET | ORAL | Status: DC | PRN

## 2014-02-10 MED ORDER — LINAGLIPTIN 5 MG PO TABS
5.0000 mg | ORAL_TABLET | Freq: Every day | ORAL | Status: DC
  Administered 2014-02-11: 5 mg via ORAL
  Filled 2014-02-10 (×2): qty 1

## 2014-02-10 MED ORDER — TRAZODONE 25 MG HALF TABLET
25.0000 mg | ORAL_TABLET | Freq: Every evening | ORAL | Status: DC | PRN
  Administered 2014-02-10: 25 mg via ORAL
  Filled 2014-02-10 (×2): qty 1

## 2014-02-10 MED ORDER — FERROUS SULFATE 325 (65 FE) MG PO TABS
325.0000 mg | ORAL_TABLET | Freq: Every day | ORAL | Status: DC
  Administered 2014-02-11 – 2014-02-12 (×2): 325 mg via ORAL
  Filled 2014-02-10 (×4): qty 1

## 2014-02-10 MED ORDER — ONDANSETRON HCL 4 MG/2ML IJ SOLN: 4.0000 mg | Freq: Four times a day (QID) | INTRAMUSCULAR | Status: DC | PRN

## 2014-02-10 MED ORDER — ACETAMINOPHEN 650 MG RE SUPP: 650.0000 mg | Freq: Four times a day (QID) | RECTAL | Status: DC | PRN

## 2014-02-10 MED ORDER — ONDANSETRON HCL 4 MG PO TABS
4.0000 mg | ORAL_TABLET | Freq: Four times a day (QID) | ORAL | Status: DC | PRN
  Administered 2014-02-12: 4 mg via ORAL
  Filled 2014-02-10: qty 1

## 2014-02-10 MED ORDER — ASPIRIN 81 MG PO TABS: 81.0000 mg | ORAL_TABLET | Freq: Every day | ORAL | Status: DC

## 2014-02-10 MED ORDER — ASPIRIN 81 MG PO CHEW
81.0000 mg | CHEWABLE_TABLET | Freq: Every day | ORAL | Status: DC
  Administered 2014-02-11 – 2014-02-18 (×7): 81 mg via ORAL
  Filled 2014-02-10 (×7): qty 1

## 2014-02-10 MED ORDER — FUROSEMIDE 10 MG/ML IJ SOLN
80.0000 mg | Freq: Three times a day (TID) | INTRAMUSCULAR | Status: DC
  Administered 2014-02-10 – 2014-02-11 (×3): 80 mg via INTRAVENOUS
  Filled 2014-02-10 (×5): qty 8

## 2014-02-10 MED ORDER — ALUM & MAG HYDROXIDE-SIMETH 200-200-20 MG/5ML PO SUSP
30.0000 mL | Freq: Four times a day (QID) | ORAL | Status: DC | PRN
  Filled 2014-02-10: qty 30

## 2014-02-10 MED ORDER — ALLOPURINOL 100 MG PO TABS
100.0000 mg | ORAL_TABLET | Freq: Every day | ORAL | Status: DC
  Administered 2014-02-11: 100 mg via ORAL
  Filled 2014-02-10 (×2): qty 1

## 2014-02-10 MED ORDER — DILTIAZEM HCL ER COATED BEADS 360 MG PO CP24
360.0000 mg | ORAL_CAPSULE | Freq: Every day | ORAL | Status: DC
  Administered 2014-02-11: 360 mg via ORAL
  Filled 2014-02-10 (×2): qty 1

## 2014-02-10 MED ORDER — DOCUSATE SODIUM 100 MG PO CAPS
100.0000 mg | ORAL_CAPSULE | Freq: Two times a day (BID) | ORAL | Status: DC
  Administered 2014-02-10 – 2014-02-11 (×3): 100 mg via ORAL
  Filled 2014-02-10 (×5): qty 1

## 2014-02-10 NOTE — ED Notes (Signed)
Pt given apple sauce, [place] sandwich and apple juice

## 2014-02-10 NOTE — Progress Notes (Signed)
Placed patient on CPAP for the night via auto-mode with minimum pressure set at 6cm and maximum pressure set at 20cm. Oxygen set at 4lpm with Sp02=96%

## 2014-02-10 NOTE — H&P (Signed)
[facility] History and Physical                                                                                    William Flores, is a 64 y.o. male  MRN: [id number]   DOB - [birth year]  Admit Date - 02/10/2014  Outpatient Primary MD for the patient is Redge Gainer, MD  With History of -  Past Medical History  Diagnosis Date  . Diabetes   . Colon cancer   . Hypertension   . Sleep apnea   . Atrial fibrillation   . Hyperlipidemia   . CHF (congestive heart failure)       Past Surgical History  Procedure Laterality Date  . Colon resection    . Circumcision    . Colonoscopy N/A 09/21/2012    Procedure: COLONOSCOPY;  Surgeon: Inda Castle, MD;  Location: [facility];  Service: Endoscopy;  Laterality: N/A;  . US echocardiography  03/04/2010    abnormal study    in for   Chief Complaint  Patient presents with  . Shortness of Breath  . Congestive Heart Failure     HPI William Flores  is a 64 y.o. male, known past medical history diabetes, atrial fibrillation, sleep apnea on nocturnal CPAP,diastolic heart failure, likely right-sided heart failure and morbid obesity. Sent to the emergency department with shortness of breath progressive over one week. This was associated with a 10 pound weight gain and a sensation of abdominal fullness. He denies any progression in his baseline orthopnea. He reported a nonproductive cough. Denied fever or chills. In addition he stated he was started on iron. Recently for anemia.  Evaluation in the ER revealed slight acute renal insufficiency on chronic kidney disease, BNP 200. Chest x-ray demonstrated no acute disease. In addition to the above patient has had heme positive stools and had an outpatient appointment set up with [facility] for possible colonoscopy.   Review of Systems   In addition to the HPI above: No Fever-chills, myalgias or other constitutional symptoms No Headache, changes with Vision or hearing, new weakness, tingling,  numbness in any extremity, No problems swallowing food or Liquids, indigestion/reflux No Chest pain, Cough or palpitations No Abdominal pain, N/V; no melena or hematochezia, no dark tarry stools, Bowel movements are regular, endorsing abdominal fullness consistent with prior edema No dysuria, hematuria or flank pain No new skin rashes, lesions, masses or bruises, No new joints pains-aches No recent weight gain or loss No polyuria, polydypsia or polyphagia,  *A full 10 point Review of Systems was done, except as stated above, all other Review of Systems were negative.  Social History History  Substance Use Topics  . Smoking status: Former Smoker    Quit date: 08/06/1983  . Smokeless tobacco: Never Used  . Alcohol Use: No    Family History Family History  Problem Relation Age of Onset  . Breast cancer Mother   . Diabetes Mother     Prior to Admission medications   Medication Sig Start Date End Date Taking? Authorizing Provider  allopurinol (ZYLOPRIM) 100 MG tablet TAKE 1 TABLET (100 MG TOTAL) BY MOUTH DAILY. 01/26/14  Yes Orson Ape  [place], FNP  allopurinol (ZYLOPRIM) 300 MG tablet TAKE 1 TABLET (300 MG TOTAL) BY MOUTH DAILY. ALSO TAKE 100MG DAILY TO EQUAL 400MG TOTAL. 01/01/14  Yes Wardell Honour, MD  aspirin 81 MG tablet Take 81 mg by mouth daily.   Yes Historical Provider, MD  azelastine (OPTIVAR) 0.05 % ophthalmic solution Place 2 drops into both eyes 2 (two) times daily. 04/19/13  Yes Vernie Shanks, MD  benzonatate (TESSALON) 100 MG capsule Take 1 capsule (100 mg total) by mouth 3 (three) times daily as needed for cough. 02/05/14  Yes Claretta Fraise, MD  bisoprolol (ZEBETA) 5 MG tablet TAKE 0.5 TABLETS (2.5 MG TOTAL) BY MOUTH DAILY. 01/16/14  Yes Lysbeth Penner, FNP  diltiazem (CARDIZEM CD) 360 MG 24 hr capsule TAKE 1 CAPSULE (360 MG TOTAL) BY MOUTH DAILY. 12/03/13  Yes Chipper Herb, MD  ferrous sulfate 325 (65 FE) MG tablet Take 1 tablet (325 mg total) by mouth daily with  breakfast. 02/05/14  Yes Tammy Eckard, PHARMD  furosemide (LASIX) 40 MG tablet Take 80 mg by mouth 2 (two) times daily. Take 83m q AM, 40 mg hs 01/18/14  Yes Historical Provider, MD  glucose blood test strip Use to check BG daily 10/07/13  Yes Tammy Eckard, PHARMD  lisinopril (PRINIVIL,ZESTRIL) 20 MG tablet TAKE 1 TABLET (20 MG TOTAL) BY MOUTH DAILY. 12/01/13  Yes WLysbeth Penner FNP  metolazone (ZAROXOLYN) 2.5 MG tablet Take 1 tablet (2.5 mg total) by mouth daily. Patient taking differently: Take 2.5 mg by mouth daily. Take Monday,Wednesday and Friday 11/05/13  Yes SAnnita Brod MD  omeprazole (PRILOSEC) 20 MG capsule TAKE 1 CAPSULE (20 MG TOTAL) BY MOUTH DAILY. 12/17/13  Yes WLysbeth Penner FNP  OM[facility] RegionDELICA LANCETS 308UMISC 1 each by Does not apply route daily. Use one lancet to check BG daily 09/13/12  Yes Tammy Eckard, PHARMD  sitaGLIPtin-metformin (JANUMET) 50-1000 MG per tablet Take 1 tablet by mouth 2 (two) times daily with a meal. 08/05/13  Yes WLysbeth Penner FNP  warfarin (COUMADIN) 5 MG tablet TAKE 1 TO 2 TABLETS DAILY AS DIRECTED BY ANTICOAGULATION CLINIC 01/25/14  Yes Tammy Eckard, PHARMD    Allergies  Allergen Reactions  . Codeine     Headache     Physical Exam  Vitals  Blood pressure 135/75, pulse 86, temperature 98.4 F (36.9 C), temperature source Oral, resp. rate 27, height 5' 4"  (1.626 m), weight 294 lb (133.358 kg), SpO2 96 %.   General:  In no acute distress, appears healthy and well nourished  Psych:  Normal affect, Denies Suicidal or Homicidal ideations, Awake Alert, Oriented X 3. Speech and thought patterns are clear and appropriate, no apparent short term memory deficits  Neuro:   No focal neurological deficits, CN II through XII intact, Strength 5/5 all 4 extremities, Sensation intact all 4 extremities.  ENT:  Ears and Eyes appear Normal, Conjunctivae clear, PER. Moist oral mucosa without erythema or exudates.  Neck:  Supple, No lymphadenopathy  appreciated  Respiratory:  Symmetrical chest wall movement, Good air movement bilaterally, mostly clear to auscultation with bibasilar crackles mid fields down posteriorly. Room Air  Cardiac:  Irregular, in atrial fibrillation w/ ventricular response in the 80s, No Murmurs, 1+ bilateral lower extremity edema noted, no JVD, No carotid bruits, peripheral pulses palpable at 2+  Abdomen:  Positive bowel sounds, Soft, Non tender, slightly distended no evidence of pitting edema,  No masses appreciated, no obvious hepatosplenomegaly  Skin:  No Cyanosis,  Normal Skin Turgor, No Skin Rash or Bruise.  Extremities: Symmetrical without obvious trauma or injury,  no effusions.  Data Review  CBC  Recent Labs Lab 02/10/14 1158 02/10/14 1401  WBC 6.9 6.3  HGB 10.3* 10.3*  HCT 38.3* 36.8*  PLT  --  237  MCV 78.2* 81.2  MCH 21.1* 22.7*  MCHC 27.0* 28.0*  RDW  --  22.3*  LYMPHSABS  --  0.6*  MONOABS  --  0.6  EOSABS  --  0.1  BASOSABS  --  0.1    Chemistries   Recent Labs Lab 02/10/14 1401  NA 140  K 3.8  CL 98  CO2 37*  GLUCOSE 146*  BUN 46*  CREATININE 1.58*  CALCIUM 9.1    estimated creatinine clearance is 61 mL/min (by C-G formula based on Cr of 1.58).  No results for input(s): TSH, T4TOTAL, T3FREE, THYROIDAB in the last 72 hours.  Invalid input(s): FREET3  Coagulation profile  Recent Labs Lab 02/10/14 1402  INR 2.19*    No results for input(s): DDIMER in the last 72 hours.  Cardiac Enzymes  Recent Labs Lab 02/10/14 1401  TROPONINI <0.03    Invalid input(s): POCBNP  Urinalysis No results found for: COLORURINE, APPEARANCEUR, LABSPEC, PHURINE, GLUCOSEU, HGBUR, BILIRUBINUR, KETONESUR, PROTEINUR, UROBILINOGEN, NITRITE, LEUKOCYTESUR  Imaging results:   Dg Chest 2 View  02/10/2014   CLINICAL DATA:  Shortness of breath and decreased oxygen saturations.  EXAM: CHEST  2 VIEW  COMPARISON:  10/28/2013 and 08/05/2013  FINDINGS: Chronic pleural thickening along both  sides of the chest. Heart size is upper limits of normal but unchanged. The trachea is midline. Concern for small pleural effusions on the lateral view. Prominent lung markings suggest vascular congestion or mild edema. Trachea is midline.  IMPRESSION: Vascular congestion or mild interstitial edema.  Concern for small pleural effusions.   Electronically Signed   By: Markus Daft M.D.   On: 02/10/2014 14:16   Dg Chest 2 View  02/10/2014   CLINICAL DATA:  Cough and shortness of breath for 2 months.  EXAM: CHEST  2 VIEW  COMPARISON:  PA and lateral chest 08/05/2013, 02/20/2006 and 02/10/2014.  FINDINGS: Small chronic bilateral pleural effusions versus pleural thickening along the chest walls appear unchanged. There is cardiomegaly. Heart size is upper normal. No pneumothorax or pleural effusion.  IMPRESSION: No acute disease.   Electronically Signed   By: Inge Rise M.D.   On: 02/10/2014 13:49     EKG: Atrial fibrillation with ventricular rate 82   Assessment & Plan  Principal Problem:   Acute respiratory failure with hypoxia:   A) Acute on chronic diastolic heart failure   B) suspected RHF -Typically on Lasix 80 mg every a.m. and 40 mg every p.m. therefore will increase to Lasix IV 80 mg every 8 hours -Continue every Monday Wednesday Friday metolazone -Continue ACE inhibitor and bisoprolol -Admit to telemetry unit  Active Problems:   Renal failure on chronic kidney disease stage III -Slight progression in renal failure but suspects actually needs diuresis -Continue ACE inhibitor and diuretics as above -Follow labs with diuresis monitoring for progression in renal failure    Morbid obesity/OSA on CPAP -Continue every at bedtime CPAP    DM type 2, uncontrolled, with renal complications -Current CBG mildly elevated at 146 -Holding Janumet in favor of Januvia (will use therapeutic substitution) and resistant sliding scale insulin -Check hemoglobin A1c    Anemia -Recently started on  iron -Heme positive and outpatient gastric  urology evaluation pending -Current hemoglobin stable around 10    Chronic atrial fibrillation -Currently rate controlled -Continue Cardizem -Continue warfarin pharmacy to manage -CHADSVASc is 3    HTN  -Currently controlled on home regimen    Hyperlipidemia -Repeat lipid panel -Not on medications prior to admission     DVT Prophylaxis: Warfarin  Family Communication:   No family at bedside  Code Status:  Full  Condition:  Stable  Time spent in minutes : 60   Muzammil Bruins L. ANP on 02/10/2014 at 4:43 PM  Between 7am to 7pm - Pager - [phone]  After 7pm go to [email or web address] - password [email or web address]  And look for the night coverage person covering me after hours  [facility]

## 2014-02-10 NOTE — ED Notes (Signed)
CBG 178

## 2014-02-10 NOTE — Progress Notes (Signed)
Subjective:  Patient ID: William Flores, male    DOB: 10-24-1950  Age: 64 y.o. MRN: [id number]  CC: Shortness of Breath   HPI William Flores presents for increasing dyspnea over the last several days. Feels increase in fluid in abd. Dyspnea at rest and with activity is moderate to severe. He can lay on his side but can not lay supine due to dyspnea. Has PND nightly. Onset 6 weeks ago. Since then has gained 10 lbs, primarily in the lower abd. Now unsteady on his feet. Unable to speak without dyspnea. Has to talk in short choppy sentences.  History William Flores has a past medical history of Diabetes; Colon cancer; Hypertension; Sleep apnea; Atrial fibrillation; Hyperlipidemia; and CHF (congestive heart failure).   He has past surgical history that includes Colon resection; Circumcision; Colonoscopy (N/A, 09/21/2012); and US echocardiography (03/04/2010).   His family history includes Breast cancer in his mother; Diabetes in his mother.He reports that he quit smoking about 30 years ago. He has never used smokeless tobacco. He reports that he does not drink alcohol or use illicit drugs.  No current facility-administered medications on file prior to visit.   Current Outpatient Prescriptions on File Prior to Visit  Medication Sig Dispense Refill  . allopurinol (ZYLOPRIM) 100 MG tablet TAKE 1 TABLET (100 MG TOTAL) BY MOUTH DAILY. 30 tablet 3  . allopurinol (ZYLOPRIM) 300 MG tablet TAKE 1 TABLET (300 MG TOTAL) BY MOUTH DAILY. ALSO TAKE 100MG DAILY TO EQUAL 400MG TOTAL. 30 tablet 0  . aspirin 81 MG tablet Take 81 mg by mouth daily.    Marland Kitchen azelastine (OPTIVAR) 0.05 % ophthalmic solution Place 2 drops into both eyes 2 (two) times daily. 6 mL 12  . benzonatate (TESSALON) 100 MG capsule Take 1 capsule (100 mg total) by mouth 3 (three) times daily as needed for cough. 20 capsule 0  . bisoprolol (ZEBETA) 5 MG tablet TAKE 0.5 TABLETS (2.5 MG TOTAL) BY MOUTH DAILY. 30 tablet 1  . diltiazem (CARDIZEM CD) 360 MG 24 hr  capsule TAKE 1 CAPSULE (360 MG TOTAL) BY MOUTH DAILY. 30 capsule 5  . ferrous sulfate 325 (65 FE) MG tablet Take 1 tablet (325 mg total) by mouth daily with breakfast.  3  . glucose blood test strip Use to check BG daily 100 each 2  . lisinopril (PRINIVIL,ZESTRIL) 20 MG tablet TAKE 1 TABLET (20 MG TOTAL) BY MOUTH DAILY. 30 tablet 4  . metolazone (ZAROXOLYN) 2.5 MG tablet Take 1 tablet (2.5 mg total) by mouth daily. (Patient taking differently: Take 2.5 mg by mouth daily. Take Monday,Wednesday and Friday) 30 tablet 1  . omeprazole (PRILOSEC) 20 MG capsule TAKE 1 CAPSULE (20 MG TOTAL) BY MOUTH DAILY. 30 capsule 4  . ONETOUCH DELICA LANCETS 53Z MISC 1 each by Does not apply route daily. Use one lancet to check BG daily 100 each 2  . sitaGLIPtin-metformin (JANUMET) 50-1000 MG per tablet Take 1 tablet by mouth 2 (two) times daily with a meal. 60 tablet 3  . warfarin (COUMADIN) 5 MG tablet TAKE 1 TO 2 TABLETS DAILY AS DIRECTED BY ANTICOAGULATION CLINIC (Patient taking differently: Take 7.82m daily exc 186mon Mon / Fri) 60 tablet 2  . [DISCONTINUED] diltiazem (TIAZAC) 240 MG 24 hr capsule TAKE 1 CAPSULE DAILY 30 capsule 3    ROS Review of Systems  Constitutional: Positive for fatigue and unexpected weight change (increase 10 lbs over 6 wks). Negative for fever, chills and diaphoresis.  HENT: Negative for congestion,  hearing loss, rhinorrhea, sore throat and trouble swallowing.   Respiratory: Positive for cough, chest tightness and shortness of breath. Negative for wheezing.   Gastrointestinal: Negative for nausea, vomiting, abdominal pain, diarrhea, constipation and abdominal distention.  Endocrine: Negative for cold intolerance and heat intolerance.  Genitourinary: Negative for dysuria, hematuria and flank pain.  Musculoskeletal: Negative for joint swelling and arthralgias.  Skin: Negative for rash.  Neurological: Negative for dizziness and headaches.  Psychiatric/Behavioral: Negative for dysphoric  mood, decreased concentration and agitation. The patient is not nervous/anxious.     Objective:  BP 107/66 mmHg  Pulse 100  Temp(Src) 97.2 F (36.2 C) (Oral)  Ht 5' 4"  (1.626 m)  Wt 294 lb 12.8 oz (133.72 kg)  BMI 50.58 kg/m2  SpO2 70%  Physical Exam  Constitutional: He is oriented to person, place, and time. He appears well-developed and well-nourished. No distress.  HENT:  Head: Normocephalic and atraumatic.  Right Ear: External ear normal.  Left Ear: External ear normal.  Nose: Nose normal.  Mouth/Throat: Oropharynx is clear and moist.  Eyes: Conjunctivae and EOM are normal. Pupils are equal, round, and reactive to light.  Neck: Normal range of motion. Neck supple. No thyromegaly present.  Cardiovascular: Normal rate, regular rhythm and normal heart sounds.   No murmur heard. Pulmonary/Chest: He is in respiratory distress. He has no wheezes. He has rales.  Abdominal: Soft. Bowel sounds are normal. He exhibits no distension. There is no tenderness.  Lymphadenopathy:    He has no cervical adenopathy.  Neurological: He is alert and oriented to person, place, and time. He has normal reflexes.  Skin: Skin is warm and dry.  Psychiatric: He has a normal mood and affect. His behavior is normal. Judgment and thought content normal.    Assessment & Plan:   William Flores was seen today for shortness of breath.  Diagnoses and associated orders for this visit:  SOB (shortness of breath) - DG Chest 2 View - POCT CBC    I have discontinued [name] atorvastatin and furosemide. I am also having him maintain his aspirin, ONETOUCH DELICA LANCETS 88E, azelastine, sitaGLIPtin-metformin, glucose blood, metolazone, lisinopril, diltiazem, omeprazole, allopurinol, bisoprolol, warfarin, allopurinol, benzonatate, ferrous sulfate, and furosemide.  Meds ordered this encounter  Medications  . furosemide (LASIX) 40 MG tablet    Sig: Take 80 mg by mouth 2 (two) times daily. Take 48m q AM, 40 mg  hs    Refill:  3   Pt. Had RA pulse ox of 70. On 2 liter [place], 85%, On 3L is 90-92% except when pt speaks drops to 88. [facility] Follow-up: No Follow-up on file.  WClaretta Fraise M.D.

## 2014-02-10 NOTE — ED Notes (Signed)
[facility], came in with sob.  Has had exacerbations of CHF before, was hypoxic 74% with exertion.   Generalized swelling and fluid retention.  ECG wnl.  VS WNL on 4 liters [place]

## 2014-02-10 NOTE — Progress Notes (Signed)
ANTICOAGULATION CONSULT NOTE - Initial Consult  Pharmacy Consult for Coumadin Indication: atrial fibrillation  Allergies  Allergen Reactions  . Codeine     Headache     Patient Measurements: Height: 5' 4"  (162.6 cm) Weight: 294 lb (133.358 kg) IBW/kg (Calculated) : 59.2  Vital Signs: Temp: 98.4 F (36.9 C) (02/08 1317) Temp Source: Oral (02/08 1317) BP: 136/72 mmHg (02/08 1648) Pulse Rate: 93 (02/08 1648)  Labs:  Recent Labs  02/10/14 1158 02/10/14 1401 02/10/14 1402  HGB 10.3* 10.3*  --   HCT 38.3* 36.8*  --   PLT  --  237  --   LABPROT  --   --  24.5*  INR  --   --  2.19*  CREATININE  --  1.58*  --   TROPONINI  --  <0.03  --     Estimated Creatinine Clearance: 61 mL/min (by C-G formula based on Cr of 1.58).   Medical History: Past Medical History  Diagnosis Date  . Diabetes   . Colon cancer   . Hypertension   . Sleep apnea   . Atrial fibrillation   . Hyperlipidemia   . CHF (congestive heart failure)     Medications:   (Not in a hospital admission)  Assessment: 64 yo M presents on 2/8 with SOB. Pt also has had a 10lb weight gain recently. Pharmacy to continue and dose coumadin for Afib. PMH includes DM, HTN, Afib, HLD, CHF. INR is therapeutic at 2.19 today. Hgb is 10.3 and pts 237. Last dose was this morning on 2/8.  PTA Coumadin 7.31m daily exc 147mMon / Fri  Goal of Therapy:  INR 2-3 Monitor platelets by anticoagulation protocol: Yes   Plan: No coumadin tonight (Pt already took this am) Monitor daily INR, CBC, s/s of bleed  William Flores J 02/10/2014,4:57 PM

## 2014-02-10 NOTE — ED Notes (Signed)
generalized swelling increasing over the past week, especially in abdomen.  Feeling palpitations

## 2014-02-10 NOTE — ED Provider Notes (Signed)
CSN: [id number]     Arrival date & time 02/10/14  1308 History   First MD Initiated Contact with Patient 02/10/14 1402     Chief Complaint  Patient presents with  . Shortness of Breath  . Congestive Heart Failure     (Consider location/radiation/quality/duration/timing/severity/associated sxs/prior Treatment) HPI Comments: Patient here complaining of shortness of breath and increased abdominal distention as well as lower extremity edema. Seen his doctor today and had a pulse oximetry of 74% on room air. He does not normally use oxygen. Denies any anginal type chest pain. Does have a history of CHF and this is similar. Has been compliant with his diuretics. Has had a nonproductive cough without fever or chills. Symptoms worse with exertion better with rest. He also notes orthopnea. Doctor sent him here for evaluation  Patient is a 64 y.o. male presenting with shortness of breath and CHF. The history is provided by the patient.  Shortness of Breath Congestive Heart Failure Associated symptoms include shortness of breath.    Past Medical History  Diagnosis Date  . Diabetes   . Colon cancer   . Hypertension   . Sleep apnea   . Atrial fibrillation   . Hyperlipidemia   . CHF (congestive heart failure)    Past Surgical History  Procedure Laterality Date  . Colon resection    . Circumcision    . Colonoscopy N/A 09/21/2012    Procedure: COLONOSCOPY;  Surgeon: Inda Castle, MD;  Location: [facility];  Service: Endoscopy;  Laterality: N/A;  . US echocardiography  03/04/2010    abnormal study   Family History  Problem Relation Age of Onset  . Breast cancer Mother   . Diabetes Mother    History  Substance Use Topics  . Smoking status: Former Smoker    Quit date: 08/06/1983  . Smokeless tobacco: Never Used  . Alcohol Use: No    Review of Systems  Respiratory: Positive for shortness of breath.   All other systems reviewed and are negative.     Allergies  Codeine  Home  Medications   Prior to Admission medications   Medication Sig Start Date End Date Taking? Authorizing Provider  allopurinol (ZYLOPRIM) 100 MG tablet TAKE 1 TABLET (100 MG TOTAL) BY MOUTH DAILY. 01/26/14  Yes Lysbeth Penner, FNP  allopurinol (ZYLOPRIM) 300 MG tablet TAKE 1 TABLET (300 MG TOTAL) BY MOUTH DAILY. ALSO TAKE 100MG DAILY TO EQUAL 400MG TOTAL. 01/01/14  Yes Wardell Honour, MD  aspirin 81 MG tablet Take 81 mg by mouth daily.   Yes Historical Provider, MD  azelastine (OPTIVAR) 0.05 % ophthalmic solution Place 2 drops into both eyes 2 (two) times daily. 04/19/13  Yes Vernie Shanks, MD  benzonatate (TESSALON) 100 MG capsule Take 1 capsule (100 mg total) by mouth 3 (three) times daily as needed for cough. 02/05/14  Yes Claretta Fraise, MD  bisoprolol (ZEBETA) 5 MG tablet TAKE 0.5 TABLETS (2.5 MG TOTAL) BY MOUTH DAILY. 01/16/14  Yes Lysbeth Penner, FNP  diltiazem (CARDIZEM CD) 360 MG 24 hr capsule TAKE 1 CAPSULE (360 MG TOTAL) BY MOUTH DAILY. 12/03/13  Yes Chipper Herb, MD  ferrous sulfate 325 (65 FE) MG tablet Take 1 tablet (325 mg total) by mouth daily with breakfast. 02/05/14  Yes Tammy Eckard, PHARMD  furosemide (LASIX) 40 MG tablet Take 80 mg by mouth 2 (two) times daily. Take 61m q AM, 40 mg hs 01/18/14  Yes Historical Provider, MD  glucose blood test strip  Use to check BG daily 10/07/13  Yes Tammy Eckard, PHARMD  lisinopril (PRINIVIL,ZESTRIL) 20 MG tablet TAKE 1 TABLET (20 MG TOTAL) BY MOUTH DAILY. 12/01/13  Yes Lysbeth Penner, FNP  metolazone (ZAROXOLYN) 2.5 MG tablet Take 1 tablet (2.5 mg total) by mouth daily. Patient taking differently: Take 2.5 mg by mouth daily. Take Monday,Wednesday and Friday 11/05/13  Yes Annita Brod, MD  omeprazole (PRILOSEC) 20 MG capsule TAKE 1 CAPSULE (20 MG TOTAL) BY MOUTH DAILY. 12/17/13  Yes Lysbeth Penner, FNP  [facility] DELICA LANCETS 76B MISC 1 each by Does not apply route daily. Use one lancet to check BG daily 09/13/12  Yes Tammy Eckard, PHARMD   sitaGLIPtin-metformin (JANUMET) 50-1000 MG per tablet Take 1 tablet by mouth 2 (two) times daily with a meal. 08/05/13  Yes Lysbeth Penner, FNP  warfarin (COUMADIN) 5 MG tablet TAKE 1 TO 2 TABLETS DAILY AS DIRECTED BY ANTICOAGULATION CLINIC 01/25/14  Yes Tammy Eckard, PHARMD   BP 141/64 mmHg  Pulse 82  Temp(Src) 98.4 F (36.9 C) (Oral)  Resp 22  Ht 5' 4"  (1.626 m)  Wt 294 lb (133.358 kg)  BMI 50.44 kg/m2  SpO2 93% Physical Exam  Constitutional: He is oriented to person, place, and time. He appears well-developed and well-nourished.  Non-toxic appearance. No distress.  HENT:  Head: Normocephalic and atraumatic.  Eyes: Conjunctivae, EOM and lids are normal. Pupils are equal, round, and reactive to light.  Neck: Normal range of motion. Neck supple. No tracheal deviation present. No thyroid mass present.  Cardiovascular: Normal rate, regular rhythm and normal heart sounds.  Exam reveals no gallop.   No murmur heard. Pulmonary/Chest: Effort normal and breath sounds normal. No stridor. No respiratory distress. He has no decreased breath sounds. He has no wheezes. He has no rhonchi. He has no rales.  Abdominal: Soft. Normal appearance and bowel sounds are normal. He exhibits distension. He exhibits no fluid wave. There is no tenderness. There is no rebound and no CVA tenderness.  Musculoskeletal: Normal range of motion. He exhibits no edema or tenderness.  3+ bilateral lower extremity pitting edema  Neurological: He is alert and oriented to person, place, and time. He has normal strength. No cranial nerve deficit or sensory deficit. GCS eye subscore is 4. GCS verbal subscore is 5. GCS motor subscore is 6.  Skin: Skin is warm and dry. No abrasion and no rash noted.  Psychiatric: He has a normal mood and affect. His speech is normal and behavior is normal.  Nursing note and vitals reviewed.   ED Course  Procedures (including critical care time) Labs Review Labs Reviewed  BRAIN NATRIURETIC  PEPTIDE  TROPONIN I  CBC WITH DIFFERENTIAL/PLATELET  BASIC METABOLIC PANEL  PROTIME-INR    Imaging Review Dg Chest 2 View  02/10/2014   CLINICAL DATA:  Cough and shortness of breath for 2 months.  EXAM: CHEST  2 VIEW  COMPARISON:  PA and lateral chest 08/05/2013, 02/20/2006 and 02/10/2014.  FINDINGS: Small chronic bilateral pleural effusions versus pleural thickening along the chest walls appear unchanged. There is cardiomegaly. Heart size is upper normal. No pneumothorax or pleural effusion.  IMPRESSION: No acute disease.   Electronically Signed   By: Inge Rise M.D.   On: 02/10/2014 13:49     EKG Interpretation   Date/Time:  Monday February 10 2014 13:19:59 EST Ventricular Rate:  82 PR Interval:    QRS Duration: 94 QT Interval:  402 QTC Calculation: 469 R Axis:  100 Text Interpretation:  Atrial fibrillation Right axis deviation Low  voltage, precordial leads Probable anteroseptal infarct, old No  significant change since last tracing Confirmed by Jazon Jipson  MD, Briceson Broadwater  ([id number]) on 02/10/2014 2:02:31 PM      MDM   Final diagnoses:  SOB (shortness of breath)    Patient here with CHF symptoms of fluid overload. Given Lasix 80 mg IV push and will be admitted to medicine service    Leota Jacobsen, MD 02/10/14 [phone]

## 2014-02-11 ENCOUNTER — Encounter (HOSPITAL_COMMUNITY): Payer: Self-pay | Admitting: *Deleted

## 2014-02-11 LAB — GLUCOSE, CAPILLARY
GLUCOSE-CAPILLARY: 178 mg/dL — AB (ref 70–99)
GLUCOSE-CAPILLARY: 161 mg/dL — AB (ref 70–99)
GLUCOSE-CAPILLARY: 156 mg/dL — AB (ref 70–99)
Glucose-Capillary: 198 mg/dL — ABNORMAL HIGH (ref 70–99)
Glucose-Capillary: 193 mg/dL — ABNORMAL HIGH (ref 70–99)
Glucose-Capillary: 183 mg/dL — ABNORMAL HIGH (ref 70–99)

## 2014-02-11 LAB — CBC
HCT: 38.3 % — ABNORMAL LOW (ref 39.0–52.0)
Hemoglobin: 10.2 g/dL — ABNORMAL LOW (ref 13.0–17.0)
MCH: 22.6 pg — AB (ref 26.0–34.0)
MCHC: 26.6 g/dL — ABNORMAL LOW (ref 30.0–36.0)
MCV: 84.9 fL (ref 78.0–100.0)
PLATELETS: 229 10*3/uL (ref 150–400)
RBC: 4.51 MIL/uL (ref 4.22–5.81)
RDW: 22.6 % — AB (ref 11.5–15.5)
WBC: 6.8 10*3/uL (ref 4.0–10.5)

## 2014-02-11 LAB — BASIC METABOLIC PANEL
Anion gap: 10 (ref 5–15)
BUN: 48 mg/dL — AB (ref 6–23)
CO2: 32 mmol/L (ref 19–32)
CREATININE: 1.62 mg/dL — AB (ref 0.50–1.35)
Calcium: 9.2 mg/dL (ref 8.4–10.5)
Chloride: 97 mmol/L (ref 96–112)
GFR calc non Af Amer: 44 mL/min — ABNORMAL LOW (ref >90)
GFR, EST AFRICAN AMERICAN: 51 mL/min — AB (ref >90)
Glucose, Bld: 184 mg/dL — ABNORMAL HIGH (ref 70–99)
Potassium: 3.8 mmol/L (ref 3.5–5.1)
Sodium: 139 mmol/L (ref 135–145)

## 2014-02-11 LAB — LIPID PANEL
Cholesterol: 150 mg/dL (ref 0–200)
HDL: 20 mg/dL — AB (ref >39)
LDL Cholesterol: 107 mg/dL — ABNORMAL HIGH (ref 0–99)
Total CHOL/HDL Ratio: 7.5 RATIO
Triglycerides: 114 mg/dL (ref <150)
VLDL: 23 mg/dL (ref 0–40)

## 2014-02-11 LAB — PROTIME-INR
INR: 2.06 — ABNORMAL HIGH (ref 0.00–1.49)
PROTHROMBIN TIME: 23.4 s — AB (ref 11.6–15.2)

## 2014-02-11 LAB — HEMOGLOBIN A1C
Hgb A1c MFr Bld: 8.1 % — ABNORMAL HIGH (ref 4.8–5.6)
Mean Plasma Glucose: 186 mg/dL

## 2014-02-11 MED ORDER — FUROSEMIDE 10 MG/ML IJ SOLN
80.0000 mg | Freq: Three times a day (TID) | INTRAMUSCULAR | Status: DC
  Administered 2014-02-12: 80 mg via INTRAVENOUS
  Filled 2014-02-11 (×3): qty 8

## 2014-02-11 MED ORDER — WARFARIN SODIUM 7.5 MG PO TABS
7.5000 mg | ORAL_TABLET | Freq: Once | ORAL | Status: AC
  Administered 2014-02-11: 7.5 mg via ORAL
  Filled 2014-02-11: qty 1

## 2014-02-11 NOTE — Care Management Note (Addendum)
    Page 1 of 2   02/26/2014     2:02:43 PM CARE MANAGEMENT NOTE 02/26/2014  Patient:  William Flores, William Flores   Account Number:  [id number]  Date Initiated:  02/11/2014  Documentation initiated by:  AMERSON,JULIE  Subjective/Objective Assessment:   Pt adm on 02/10/14 with SOB, CHF.  PTA, pt lives alone.     Action/Plan:   PT recommending HHPT at dc; will likely need home O2 as well.  Will cont to follow for dc needs as pt progresses.   Anticipated DC Date:  02/14/2014   Anticipated DC Plan:  [place]  CM consult      Choice offered to / List presented to:             Status of service:  Completed, signed off Medicare Important Message given?  NO (If response is "NO", the following Medicare IM given date fields will be blank) Date Medicare IM given:   Medicare IM given by:   Date Additional Medicare IM given:   Additional Medicare IM given by:    Discharge Disposition:  [place]  Per UR Regulation:  Reviewed for med. necessity/level of care/duration of stay  If discussed at [place] of Stay Meetings, dates discussed:   02/18/2014  02/20/2014  02/25/2014    Comments:  Contact:      Son  Taray Normoyle - cell [place]                      [facility] Daughter [phone]     [facility] Relative [phone]   02-26-14 [address] [place], BSN [phone] Plan for d/c to SNF today. [facility].  02-24-14 1541 Jacqlyn Krauss, RN,BSN [phone] CM did speak with PT after therapy and the plan is for SNF- Pt is agreeable and first choice is [facility]. CM did make CSW aware. No further needs from CM at this time.  02-24-14 [place], RN, BSN [phone] CM did speak with pt in regards to [facility] services. Pt states he has a neighbor that will check in on him as well as his ex wife. Pt lives in [place] will offer choice to [facility] services. PT to consult again to see if  recommendations have changed. Will continue to f/u.  02-20-14 3:30pm Luz Lex, [place] [phone] Patient in network for [facility].  Does have Lt[facility] benefit but needs to be recerted.  CM will continue to follow.  02-19-14 [place] [phone] Extubated on 2-14 - tx to intermediate unit on 2-15. Reintubated on 2-17 and tx back to ICU  02-12-14 2:50pm Luz Lex, RNBSN - [phone] Worsening hypercapnic resp failure, requiring intubation. tx to ICU and intubated. On pressors.

## 2014-02-11 NOTE — Progress Notes (Signed)
[place] for Coumadin Indication: atrial fibrillation  Allergies  Allergen Reactions  . Codeine     Headache     Patient Measurements: Height: 5' 4"  (162.6 cm) Weight: 287 lb 3.2 oz (130.273 kg) (c scale) IBW/kg (Calculated) : 59.2  Vital Signs: Temp: 98.2 F (36.8 C) (02/09 0557) Temp Source: Oral (02/09 0557) BP: 125/70 mmHg (02/09 1002) Pulse Rate: 102 (02/09 0557)  Labs:  Recent Labs  02/10/14 1158 02/10/14 1401 02/10/14 1402 02/11/14 0750  HGB 10.3* 10.3*  --  10.2*  HCT 38.3* 36.8*  --  38.3*  PLT  --  237  --  229  LABPROT  --   --  24.5* 23.4*  INR  --   --  2.19* 2.06*  CREATININE  --  1.58*  --  1.62*  TROPONINI  --  <0.03  --   --     Estimated Creatinine Clearance: 58.6 mL/min (by C-G formula based on Cr of 1.62).  Assessment: 64 yo M presents on 2/8 with SOB. Pt also has had a 10lb weight gain recently. Pharmacy to continue and dose coumadin for Afib. PMH includes DM, HTN, Afib, HLD, CHF. INR is therapeutic at 2.06 today. Hgb is 10.2 and pts 229. Last dose was on 2/8. +FOB.  Pt has outpatient colonoscopy scheduled.   PTA Coumadin 7.66m daily exc 13mMon / Fri  Goal of Therapy:  INR 2-3   Plan: -dc sq heparin -continue home dose of coumadin and give 7.5 mg today -daily INR MiEudelia BunchPharm.D. [id number]/09/2014 2:36 PM

## 2014-02-11 NOTE — Evaluation (Addendum)
Occupational Therapy Evaluation Patient Details Name: William Flores MRN: [id number] DOB: Nov 19, 1950 Today's Date: 02/11/2014    History of Present Illness Pt is a 64 y.o. male, known PMH diabetes, atrial fibrillation, sleep apnea on nocturnal CPAP, diastolic heart failure, likely right-sided heart failure and morbid obesity. Sent to the emergency department with shortness of breath progressive over one week. This was associated with a 10 pound weight gain and a sensation of abdominal fullness. He denies any progression in his baseline orthopnea. Evaluation in the ER revealed slight acute renal insufficiency on chronic kidney disease.   Clinical Impression   Pt admitted with above. Pt independent with ADLs, PTA. Pt's O2 dropping in session (see vitals below). Feel pt will benefit from acute OT to increase activity tolerance and strength prior to d/c.    Follow Up Recommendations  No OT follow up;Supervision - Intermittent    Equipment Recommendations  None recommended by OT    Recommendations for Other Services       Precautions / Restrictions Precautions Precautions: Fall Precaution Comments: Watch O2 sats Restrictions Weight Bearing Restrictions: No      Mobility Bed Mobility               General bed mobility comments: not assessed  Transfers Overall transfer level: Needs assistance Equipment used: None Transfers: Sit to/from Stand Sit to Stand: Supervision              Balance  No LOB in session-supervision for ambulation.                                          ADL Overall ADL's : Needs assistance/impaired     Grooming: Wash/dry face;Supervision/safety;Set up;Standing       Lower Body Bathing: Set up;Supervison/ safety;Sit to/from stand       Lower Body Dressing: Supervision/safety;Sit to/from stand;Set up   Toilet Transfer: Supervision/safety;Ambulation (chair)   Toileting- Clothing Manipulation and Hygiene:  Supervision/safety;Sit to/from stand       Functional mobility during ADLs: Supervision/safety General ADL Comments: Educated on safety such as safe shoewear and sitting for LB ADLs as well as rugs/items on floor. Educated on energy conservation techniques and deep breathing technique. Explained benefit of therapy. Pt reports sponge bathing but sometimes getting in shower.     Vision     Perception     Praxis      Pertinent Vitals/Pain Pain Assessment: No/denies pain; O2 dropping in 80's in session on 3L of O2. Cues for deep breathing technique. O2 trended up to 90's.     Hand Dominance Right   Extremity/Trunk Assessment Upper Extremity Assessment Upper Extremity Assessment: Overall WFL for tasks assessed   Lower Extremity Assessment Lower Extremity Assessment: Defer to PT evaluation       Communication Communication Communication: No difficulties   Cognition Arousal/Alertness: Lethargic;Suspect due to medications (reported feeling sleepy/tired) Behavior During Therapy: WFL for tasks assessed/performed Overall Cognitive Status: Within Functional Limits for tasks assessed                     General Comments       Exercises       Shoulder Instructions      Home Living Family/patient expects to be discharged to:: Private residence Living Arrangements: Alone Available Help at Discharge: Family;Friend(s);Available PRN/intermittently Type of Home: House Home Access: Stairs to enter [employer] of  Steps: 3   Home Layout: One level     Bathroom Shower/Tub: Tub/shower unit   Bathroom Toilet: Standard (sink close)     Home Equipment: Walker - 2 wheels;Cane - single point;Shower seat          Prior Functioning/Environment Level of Independence: Independent        Comments: Has been retired for 1 month    OT Diagnosis: Generalized weakness;Other (comment) (cardiopulmonary status limiting activity)   OT Problem List: Decreased  strength;Obesity;Decreased knowledge of use of DME or AE;Decreased knowledge of precautions;Decreased activity tolerance   OT Treatment/Interventions: Self-care/ADL training;DME and/or AE instruction;Therapeutic activities;Balance training;Patient/family education;Energy conservation    OT Goals(Current goals can be found in the care plan section) Acute Rehab OT Goals Patient Stated Goal: not stated OT Goal Formulation: With patient Time For Goal Achievement: 02/18/14 Potential to Achieve Goals: Good ADL Goals Pt Will Perform Tub/Shower Transfer: Tub transfer;with modified independence;ambulating;shower seat Additional ADL Goal #1: Pt will independently verbalize and demonstrate 3/3 energy conservation techniques during ADLs and functional activities.  OT Frequency: Min 2X/week   Barriers to D/C:            Co-evaluation              End of Session Equipment Utilized During Treatment: Gait belt;Oxygen  Activity Tolerance: Other (comment);Patient limited by fatigue (reported being tired-suspect due to meds) Patient left: in bed;with call bell/phone within reach;with bed alarm set   Time: 1443-1500 OT Time Calculation (min): 17 min Charges:  OT General Charges $OT Visit: 1 Procedure OT Evaluation $Initial OT Evaluation Tier I: 1 Procedure G-CodesBenito Mccreedy OTR/L [phone] 02/11/2014, 4:33 PM

## 2014-02-11 NOTE — Progress Notes (Signed)
[facility] PROGRESS NOTE  William Flores YDX:[id number] DOB: [birth year] DOA: 02/10/2014 PCP: Redge Gainer, MD  Assessment/Plan: 1. Acutely decompensated diastolic CHF 1. Pt is continued on 48m lasix q8hrs with metolazone 2. Thus far, net neg 1.98L since admit 3. Pt reports feeling better today 4. Will follow renal function closely 5. Wt today 130 kg 6. Wt on admit 133kg 2. CKD 3 1. Cr stable  2. Cont with above diuresis for now 3. Morbid obesity with OSA on CPAP 1. stable 4. DM2 1. Glucose stable thus far 2. On Tradjenta and SSI coverage 5. Anemia 1. Hgb stable 2. Cont to monitor 6. Chronic afib 1. Rate controlled 2. On coumadin 3. Follow daily INR and adjust dosing accordingly, per pharmacy 7. HTN 1. BP mildly low this am with sbp in the upper 80's 2. Will d/c lisinopril 8. HLD 1. LDL 107 9. DVT prophylaixs 1. Cont on coumadin  Code Status: Full Family Communication: Pt in room (indicate person spoken with, relationship, and if by phone, the number) Disposition Plan: Pending   Consultants:    Procedures:    Antibiotics:  none (indicate start date, and stop date if known)  HPI/Subjective: Feels better today. Reports being able to breathe better  Objective: Filed Vitals:   02/11/14 1002 02/11/14 1533 02/11/14 1541 02/11/14 1544  BP: 125/70 85/45 90/55  87/52  Pulse:  79    Temp:  98 F (36.7 C)    TempSrc:  Oral    Resp:  16    Height:      Weight:      SpO2:  93%      Intake/Output Summary (Last 24 hours) at 02/11/14 1551 Last data filed at 02/11/14 1400  Gross per 24 hour  Intake    620 ml  Output   2600 ml  Net  -1980 ml   Filed Weights   02/10/14 1317 02/10/14 2124 02/11/14 0557  Weight: 133.358 kg (294 lb) 132.087 kg (291 lb 3.2 oz) 130.273 kg (287 lb 3.2 oz)    Exam:   General:  Awake, in nad  Cardiovascular: regular, s1, s2  Respiratory: normal resp effort, no wheezing  Abdomen: soft, obese,  nondistended  Musculoskeletal: perfused, no clubbing   Data Reviewed: Basic Metabolic Panel:  Recent Labs Lab 02/10/14 1401 02/11/14 0750  NA 140 139  K 3.8 3.8  CL 98 97  CO2 37* 32  GLUCOSE 146* 184*  BUN 46* 48*  CREATININE 1.58* 1.62*  CALCIUM 9.1 9.2   Liver Function Tests: No results for input(s): AST, ALT, ALKPHOS, BILITOT, PROT, ALBUMIN in the last 168 hours. No results for input(s): LIPASE, AMYLASE in the last 168 hours. No results for input(s): AMMONIA in the last 168 hours. CBC:  Recent Labs Lab 02/10/14 1158 02/10/14 1401 02/11/14 0750  WBC 6.9 6.3 6.8  NEUTROABS  --  4.9  --   HGB 10.3* 10.3* 10.2*  HCT 38.3* 36.8* 38.3*  MCV 78.2* 81.2 84.9  PLT  --  237 229   Cardiac Enzymes:  Recent Labs Lab 02/10/14 1401  TROPONINI <0.03   BNP (last 3 results)  Recent Labs  11/13/13 1121 02/10/14 1401  BNP 105.5* 200.8*    ProBNP (last 3 results)  Recent Labs  08/05/13 1403 10/28/13 1746  PROBNP 376.6* 325.7*    CBG:  Recent Labs Lab 02/11/14 1057  GLUCAP 183*    No results found for this or any previous visit (from the past 240 hour(s)).   Studies:  Dg Chest 2 View  02/10/2014   CLINICAL DATA:  Shortness of breath and decreased oxygen saturations.  EXAM: CHEST  2 VIEW  COMPARISON:  10/28/2013 and 08/05/2013  FINDINGS: Chronic pleural thickening along both sides of the chest. Heart size is upper limits of normal but unchanged. The trachea is midline. Concern for small pleural effusions on the lateral view. Prominent lung markings suggest vascular congestion or mild edema. Trachea is midline.  IMPRESSION: Vascular congestion or mild interstitial edema.  Concern for small pleural effusions.   Electronically Signed   By: Markus Daft M.D.   On: 02/10/2014 14:16   Dg Chest 2 View  02/10/2014   CLINICAL DATA:  Cough and shortness of breath for 2 months.  EXAM: CHEST  2 VIEW  COMPARISON:  PA and lateral chest 08/05/2013, 02/20/2006 and 02/10/2014.   FINDINGS: Small chronic bilateral pleural effusions versus pleural thickening along the chest walls appear unchanged. There is cardiomegaly. Heart size is upper normal. No pneumothorax or pleural effusion.  IMPRESSION: No acute disease.   Electronically Signed   By: Inge Rise M.D.   On: 02/10/2014 13:49    Scheduled Meds: . allopurinol  100 mg Oral Daily  . aspirin  81 mg Oral Daily  . bisoprolol  5 mg Oral Daily  . diltiazem  360 mg Oral Daily  . docusate sodium  100 mg Oral BID  . ferrous sulfate  325 mg Oral Q breakfast  . furosemide  80 mg Intravenous 3 times per day  . insulin aspart  0-20 Units Subcutaneous TID WC  . insulin aspart  0-5 Units Subcutaneous QHS  . ketotifen  1 drop Both Eyes BID  . linagliptin  5 mg Oral Daily  . pantoprazole  40 mg Oral Daily  . potassium chloride  20 mEq Oral BID  . warfarin  7.5 mg Oral ONCE-1800  . Warfarin - Pharmacist Dosing Inpatient   Does not apply q1800   Continuous Infusions: . sodium chloride Stopped (02/10/14 1330)    Principal Problem:   Acute on chronic diastolic heart failure Active Problems:   HTN (hypertension)   DM type 2, uncontrolled, with renal complications   Hyperlipidemia   Morbid obesity   Anemia   OSA on CPAP   Acute respiratory failure with hypoxia   Chronic atrial fibrillation   Right heart failure    Tenesha Garza, [place] [facility] Pager [phone]. If 7PM-7AM, please contact night-coverage at [email or web address], password [facility] 02/11/2014, 3:51 PM  LOS: 1 day

## 2014-02-11 NOTE — Progress Notes (Signed)
Nutrition Brief Note  Patient identified on the Malnutrition Screening Tool (MST) Report  Wt Readings from Last 15 Encounters:  02/11/14 287 lb 3.2 oz (130.273 kg)  02/10/14 294 lb 12.8 oz (133.72 kg)  12/19/13 284 lb (128.822 kg)  11/29/13 282 lb (127.914 kg)  11/21/13 273 lb 14.4 oz (124.24 kg)  11/19/13 273 lb 6.4 oz (124.013 kg)  11/13/13 277 lb 12.8 oz (126.009 kg)  11/12/13 281 lb 12.8 oz (127.824 kg)  11/05/13 279 lb (126.554 kg)  10/28/13 301 lb 12.8 oz (136.896 kg)  08/19/13 282 lb 3.2 oz (128.005 kg)  08/10/13 288 lb 2.3 oz (130.7 kg)  08/05/13 300 lb 9.6 oz (136.351 kg)  07/18/13 295 lb (133.811 kg)  04/19/13 294 lb 3.2 oz (133.448 kg)    Body mass index is 49.27 kg/(m^2). Patient meets criteria for Morbid Obesity based on current BMI.   Current diet order is Heart Healthy/Carb Modified, patient is consuming approximately 100% of meals at this time. Labs and medications reviewed.   No nutrition interventions warranted at this time. If nutrition issues arise, please consult RD.   Pryor Ochoa RD, LDN Inpatient Clinical Dietitian Pager: [phone] After Hours Pager: [phone]

## 2014-02-11 NOTE — Progress Notes (Signed)
PT Progress Note:   SATURATION QUALIFICATIONS: (This note is used to comply with regulatory documentation for home oxygen)  Patient Saturations on 4L/min at Rest = 88%  Patient Saturations on 4 Liters of oxygen while Ambulating = 82%  Please briefly explain why patient needs home oxygen: Pt is unable to maintain O2 sats >88% during functional mobility.  Please see previous note for full PT Eval.   Rolinda Roan, PT, DPT Acute Rehabilitation Services Pager: [phone]

## 2014-02-11 NOTE — Evaluation (Signed)
Physical Therapy Evaluation Patient Details Name: William Flores MRN: [id number] DOB: [birth year] Today's Date: 02/11/2014   History of Present Illness  Pt is a 64 y.o. male, known PMH diabetes, atrial fibrillation, sleep apnea on nocturnal CPAP, diastolic heart failure, likely right-sided heart failure and morbid obesity. Sent to the emergency department with shortness of breath progressive over one week. This was associated with a 10 pound weight gain and a sensation of abdominal fullness. He denies any progression in his baseline orthopnea. Evaluation in the ER revealed slight acute renal insufficiency on chronic kidney disease.  Clinical Impression  Pt admitted with above diagnosis. Pt currently with functional limitations due to the deficits listed below (see PT Problem List). At the time of PT eval pt reports being very sleepy from medication he received earlier, and attributes his unsteadiness to this. Pt lives alone and I would like to see him manage a longer distance while maintaining O2 sats >88% with PT prior to d/c - pt's goal is to improve O2 status and avoid being on home O2. Pt will benefit from skilled PT to increase their independence and safety with mobility to allow discharge to the venue listed below.       Follow Up Recommendations Home health PT;Supervision - Intermittent    Equipment Recommendations  None recommended by PT    Recommendations for Other Services       Precautions / Restrictions Precautions Precautions: Fall Precaution Comments: Watch O2 sats Restrictions Weight Bearing Restrictions: No      Mobility  Bed Mobility Overal bed mobility: Modified Independent             General bed mobility comments: Pt was able to transition to EOB with no physical assistance. Minimal use of railings for support.   Transfers Overall transfer level: Needs assistance Equipment used: None Transfers: Sit to/from Stand Sit to Stand: Supervision          General transfer comment: Pt was able to power-up to full standing without assist. Supervision for safety as pt was slightly off balance - was able to recover independently.   Ambulation/Gait Ambulation/Gait assistance: Supervision Ambulation Distance (Feet): 15 Feet Assistive device: None Gait Pattern/deviations: Step-through pattern;Decreased stride length;Trendelenburg Gait velocity: Decreased Gait velocity interpretation: Below normal speed for age/gender General Gait Details: Ambulation distance limited by O2 status. Sats decreased to 82% from walking in room to recliner on 4L/min supplemental O2. Pt asymptomatic. Supervision for safety as pt appears unsteady at times, however does not require any assist and does not demonstrate LOB.   Stairs            Wheelchair Mobility    Modified Rankin (Stroke Patients Only)       Balance Overall balance assessment: Needs assistance Sitting-balance support: Feet supported;No upper extremity supported Sitting balance-Leahy Scale: Normal     Standing balance support: No upper extremity supported;During functional activity Standing balance-Leahy Scale: Fair                               Pertinent Vitals/Pain Pain Assessment: No/denies pain    Home Living Family/patient expects to be discharged to:: Private residence Living Arrangements: Alone Available Help at Discharge: Family;Friend(s);Available PRN/intermittently Type of Home: House Home Access: Stairs to enter   Entrance Stairs-Number of Steps: 3 Home Layout: One level Home Equipment: Walker - 2 wheels;Cane - single point      Prior Function Level of Independence: Independent  Comments: Has been retired for 1 month     Hand Dominance   Dominant Hand: Right    Extremity/Trunk Assessment   Upper Extremity Assessment: Defer to OT evaluation           Lower Extremity Assessment: Overall WFL for tasks assessed      Cervical /  Trunk Assessment: Normal  Communication   Communication: No difficulties  Cognition Arousal/Alertness: Awake/alert Behavior During Therapy: WFL for tasks assessed/performed Overall Cognitive Status: Within Functional Limits for tasks assessed                      General Comments      Exercises        Assessment/Plan    PT Assessment Patient needs continued PT services  PT Diagnosis Difficulty walking   PT Problem List Decreased strength;Decreased range of motion;Decreased activity tolerance;Decreased balance;Decreased mobility;Decreased knowledge of use of DME;Decreased safety awareness;Decreased knowledge of precautions;Cardiopulmonary status limiting activity  PT Treatment Interventions DME instruction;Gait training;Stair training;Functional mobility training;Therapeutic activities;Therapeutic exercise;Neuromuscular re-education;Patient/family education   PT Goals (Current goals can be found in the Care Plan section) Acute Rehab PT Goals Patient Stated Goal: Return home and avoid being on home O2 during the day.  PT Goal Formulation: With patient Time For Goal Achievement: 02/18/14 Potential to Achieve Goals: Good    Frequency Min 3X/week   Barriers to discharge        Co-evaluation               End of Session Equipment Utilized During Treatment: Oxygen Activity Tolerance: Patient tolerated treatment well Patient left: in chair;with chair alarm set;with call bell/phone within reach Nurse Communication: Mobility status         Time: 7425-9563 PT Time Calculation (min) (ACUTE ONLY): 17 min   Charges:   PT Evaluation $Initial PT Evaluation Tier I: 1 Procedure     PT G CodesRolinda Roan 2014/02/20, 12:15 PM   Rolinda Roan, PT, DPT Acute Rehabilitation Services Pager: [phone]

## 2014-02-11 NOTE — Progress Notes (Addendum)
Pt BP low this evening, 80's. Dr Wyline Copas noted earlier evening and d/c'd lisinopril. BP decreasing at current time is low 80's,pt alert and oriented, states feels good.  Dr Wyline Copas notified and HS lasix not to be given tonight. Will inform night RN to notify MD if BP remains low before 0600 lasix given.

## 2014-02-11 NOTE — Progress Notes (Signed)
Patient stated he only wears his CPAP sometimes and tonight he did not want to wear it. RT notified patient to have nurse contact RT if he decided to be put on it at a later time.

## 2014-02-12 ENCOUNTER — Inpatient Hospital Stay (HOSPITAL_COMMUNITY): Payer: BLUE CROSS/BLUE SHIELD

## 2014-02-12 ENCOUNTER — Ambulatory Visit: Payer: Self-pay | Admitting: Nurse Practitioner

## 2014-02-12 DIAGNOSIS — J9600 Acute respiratory failure, unspecified whether with hypoxia or hypercapnia: Secondary | ICD-10-CM | POA: Insufficient documentation

## 2014-02-12 DIAGNOSIS — I509 Heart failure, unspecified: Secondary | ICD-10-CM

## 2014-02-12 DIAGNOSIS — J9602 Acute respiratory failure with hypercapnia: Secondary | ICD-10-CM

## 2014-02-12 DIAGNOSIS — I10 Essential (primary) hypertension: Secondary | ICD-10-CM

## 2014-02-12 LAB — POCT I-STAT 3, ART BLOOD GAS (G3+)
Acid-Base Excess: 8 mmol/L — ABNORMAL HIGH (ref 0.0–2.0)
Bicarbonate: 32.6 mEq/L — ABNORMAL HIGH (ref 20.0–24.0)
O2 Saturation: 100 %
PCO2 ART: 46.8 mmHg — AB (ref 35.0–45.0)
Patient temperature: 98.6
TCO2: 34 mmol/L (ref 0–100)
pH, Arterial: 7.451 — ABNORMAL HIGH (ref 7.350–7.450)
pO2, Arterial: 204 mmHg — ABNORMAL HIGH (ref 80.0–100.0)

## 2014-02-12 LAB — BLOOD GAS, ARTERIAL
Acid-Base Excess: 4.1 mmol/L — ABNORMAL HIGH (ref 0.0–2.0)
BICARBONATE: 33.8 meq/L — AB (ref 20.0–24.0)
Drawn by: 28338
FIO2: 1 %
O2 SAT: 91.4 %
PH ART: 7.071 — AB (ref 7.350–7.450)
Patient temperature: 98.6
TCO2: 37.6 mmol/L (ref 0–100)
pO2, Arterial: 90.3 mmHg (ref 80.0–100.0)

## 2014-02-12 LAB — CBC
HCT: 37.5 % — ABNORMAL LOW (ref 39.0–52.0)
Hemoglobin: 9.9 g/dL — ABNORMAL LOW (ref 13.0–17.0)
MCH: 22.3 pg — ABNORMAL LOW (ref 26.0–34.0)
MCHC: 26.4 g/dL — ABNORMAL LOW (ref 30.0–36.0)
MCV: 84.5 fL (ref 78.0–100.0)
Platelets: 268 10*3/uL (ref 150–400)
RBC: 4.44 MIL/uL (ref 4.22–5.81)
RDW: 22.9 % — AB (ref 11.5–15.5)
WBC: 8.3 10*3/uL (ref 4.0–10.5)

## 2014-02-12 LAB — GLUCOSE, CAPILLARY
GLUCOSE-CAPILLARY: 229 mg/dL — AB (ref 70–99)
Glucose-Capillary: 170 mg/dL — ABNORMAL HIGH (ref 70–99)
Glucose-Capillary: 191 mg/dL — ABNORMAL HIGH (ref 70–99)
Glucose-Capillary: 226 mg/dL — ABNORMAL HIGH (ref 70–99)
Glucose-Capillary: 149 mg/dL — ABNORMAL HIGH (ref 70–99)
Glucose-Capillary: 120 mg/dL — ABNORMAL HIGH (ref 70–99)

## 2014-02-12 LAB — BASIC METABOLIC PANEL
Anion gap: 10 (ref 5–15)
BUN: 56 mg/dL — AB (ref 6–23)
CALCIUM: 8 mg/dL — AB (ref 8.4–10.5)
CO2: 29 mmol/L (ref 19–32)
Chloride: 101 mmol/L (ref 96–112)
Creatinine, Ser: 2.79 mg/dL — ABNORMAL HIGH (ref 0.50–1.35)
GFR calc Af Amer: 26 mL/min — ABNORMAL LOW (ref >90)
GFR calc non Af Amer: 23 mL/min — ABNORMAL LOW (ref >90)
GLUCOSE: 219 mg/dL — AB (ref 70–99)
Potassium: 4.1 mmol/L (ref 3.5–5.1)
SODIUM: 140 mmol/L (ref 135–145)

## 2014-02-12 LAB — URINALYSIS, ROUTINE W REFLEX MICROSCOPIC
Bilirubin Urine: NEGATIVE
Glucose, UA: NEGATIVE mg/dL
Ketones, ur: NEGATIVE mg/dL
Nitrite: NEGATIVE
PROTEIN: 100 mg/dL — AB
Specific Gravity, Urine: 1.015 (ref 1.005–1.030)
UROBILINOGEN UA: 1 mg/dL (ref 0.0–1.0)
pH: 5 (ref 5.0–8.0)

## 2014-02-12 LAB — URINE MICROSCOPIC-ADD ON

## 2014-02-12 LAB — PHOSPHORUS: PHOSPHORUS: 5.9 mg/dL — AB (ref 2.3–4.6)

## 2014-02-12 LAB — MAGNESIUM: Magnesium: 2.1 mg/dL (ref 1.5–2.5)

## 2014-02-12 LAB — HEPARIN LEVEL (UNFRACTIONATED): Heparin Unfractionated: 0.13 IU/mL — ABNORMAL LOW (ref 0.30–0.70)

## 2014-02-12 LAB — PROTIME-INR
INR: 1.64 — ABNORMAL HIGH (ref 0.00–1.49)
Prothrombin Time: 19.6 seconds — ABNORMAL HIGH (ref 11.6–15.2)

## 2014-02-12 LAB — CARBOXYHEMOGLOBIN
Carboxyhemoglobin: 2.3 % — ABNORMAL HIGH (ref 0.5–1.5)
Methemoglobin: 1.1 % (ref 0.0–1.5)
O2 Saturation: 77.3 %
TOTAL HEMOGLOBIN: 10.7 g/dL — AB (ref 13.5–18.0)

## 2014-02-12 LAB — TROPONIN I
Troponin I: <0.03 ng/mL (ref <0.031)
Troponin I: <0.03 ng/mL (ref <0.031)
Troponin I: <0.03 ng/mL (ref <0.031)

## 2014-02-12 LAB — BRAIN NATRIURETIC PEPTIDE: B Natriuretic Peptide: 140 pg/mL — ABNORMAL HIGH (ref 0.0–100.0)

## 2014-02-12 LAB — MRSA PCR SCREENING: MRSA by PCR: NEGATIVE

## 2014-02-12 MED ORDER — ETOMIDATE 2 MG/ML IV SOLN
20.0000 mg | Freq: Once | INTRAVENOUS | Status: AC
  Administered 2014-02-19: 20 mg via INTRAVENOUS

## 2014-02-12 MED ORDER — HEPARIN (PORCINE) IN NACL 100-0.45 UNIT/ML-% IJ SOLN
2000.0000 [IU]/h | INTRAMUSCULAR | Status: DC
Start: 2014-02-12 — End: 2014-02-14
  Administered 2014-02-12: 1350 [IU]/h via INTRAVENOUS
  Administered 2014-02-13: 2000 [IU]/h via INTRAVENOUS
  Administered 2014-02-13: 1900 [IU]/h via INTRAVENOUS
  Administered 2014-02-14: 2000 [IU]/h via INTRAVENOUS
  Filled 2014-02-12 (×7): qty 250

## 2014-02-12 MED ORDER — VITAL HIGH PROTEIN PO LIQD
1000.0000 mL | ORAL | Status: DC
  Administered 2014-02-12: 1000 mL
  Filled 2014-02-12 (×3): qty 1000

## 2014-02-12 MED ORDER — VITAL HIGH PROTEIN PO LIQD
1000.0000 mL | ORAL | Status: DC
  Administered 2014-02-13 – 2014-02-14 (×2): 1000 mL
  Filled 2014-02-12 (×4): qty 1000

## 2014-02-12 MED ORDER — MIDAZOLAM HCL 2 MG/2ML IJ SOLN
2.0000 mg | Freq: Once | INTRAMUSCULAR | Status: AC
  Administered 2014-02-12: 2 mg via INTRAVENOUS

## 2014-02-12 MED ORDER — CHLORHEXIDINE GLUCONATE 0.12 % MT SOLN
15.0000 mL | Freq: Two times a day (BID) | OROMUCOSAL | Status: DC
  Administered 2014-02-12 – 2014-02-17 (×11): 15 mL via OROMUCOSAL
  Filled 2014-02-12 (×13): qty 15

## 2014-02-12 MED ORDER — PRO-STAT SUGAR FREE PO LIQD
30.0000 mL | Freq: Two times a day (BID) | ORAL | Status: DC
  Filled 2014-02-12 (×2): qty 30

## 2014-02-12 MED ORDER — PRO-STAT SUGAR FREE PO LIQD
30.0000 mL | Freq: Three times a day (TID) | ORAL | Status: DC
  Administered 2014-02-12 – 2014-02-15 (×11): 30 mL
  Filled 2014-02-12 (×14): qty 30

## 2014-02-12 MED ORDER — MIDAZOLAM HCL 2 MG/2ML IJ SOLN
INTRAMUSCULAR | Status: AC
  Administered 2014-02-12: 2 mg
  Filled 2014-02-12: qty 4

## 2014-02-12 MED ORDER — CETYLPYRIDINIUM CHLORIDE 0.05 % MT LIQD
7.0000 mL | Freq: Four times a day (QID) | OROMUCOSAL | Status: DC
  Administered 2014-02-12 – 2014-02-17 (×19): 7 mL via OROMUCOSAL

## 2014-02-12 MED ORDER — FENTANYL CITRATE 0.05 MG/ML IJ SOLN
100.0000 ug | Freq: Once | INTRAMUSCULAR | Status: AC
  Administered 2014-02-12: 100 ug via INTRAVENOUS

## 2014-02-12 MED ORDER — NALOXONE HCL 0.4 MG/ML IJ SOLN
INTRAMUSCULAR | Status: AC
  Filled 2014-02-12: qty 1

## 2014-02-12 MED ORDER — FENTANYL CITRATE 0.05 MG/ML IJ SOLN
50.0000 ug | Freq: Once | INTRAMUSCULAR | Status: AC
  Administered 2014-02-12: 50 ug via INTRAVENOUS

## 2014-02-12 MED ORDER — POTASSIUM CHLORIDE 20 MEQ/15ML (10%) PO SOLN
20.0000 meq | Freq: Two times a day (BID) | ORAL | Status: DC
  Administered 2014-02-12 – 2014-02-13 (×3): 20 meq
  Filled 2014-02-12 (×4): qty 15

## 2014-02-12 MED ORDER — PERFLUTREN LIPID MICROSPHERE
1.0000 mL | INTRAVENOUS | Status: AC | PRN
  Administered 2014-02-12 (×2): 2 mL via INTRAVENOUS
  Filled 2014-02-12: qty 10

## 2014-02-12 MED ORDER — INSULIN ASPART 100 UNIT/ML ~~LOC~~ SOLN
0.0000 [IU] | SUBCUTANEOUS | Status: DC
Start: 2014-02-12 — End: 2014-02-26
  Administered 2014-02-12: 7 [IU] via SUBCUTANEOUS
  Administered 2014-02-12: 3 [IU] via SUBCUTANEOUS
  Administered 2014-02-13 – 2014-02-15 (×16): 4 [IU] via SUBCUTANEOUS
  Administered 2014-02-15: 3 [IU] via SUBCUTANEOUS
  Administered 2014-02-15 – 2014-02-16 (×2): 4 [IU] via SUBCUTANEOUS
  Administered 2014-02-16: 3 [IU] via SUBCUTANEOUS
  Administered 2014-02-16 (×2): 4 [IU] via SUBCUTANEOUS
  Administered 2014-02-16 (×2): 7 [IU] via SUBCUTANEOUS
  Administered 2014-02-16 – 2014-02-17 (×3): 3 [IU] via SUBCUTANEOUS
  Administered 2014-02-17 – 2014-02-18 (×6): 4 [IU] via SUBCUTANEOUS
  Administered 2014-02-18 (×3): 3 [IU] via SUBCUTANEOUS
  Administered 2014-02-19 (×4): 4 [IU] via SUBCUTANEOUS
  Administered 2014-02-19: 7 [IU] via SUBCUTANEOUS
  Administered 2014-02-20: 4 [IU] via SUBCUTANEOUS
  Administered 2014-02-20: 3 [IU] via SUBCUTANEOUS
  Administered 2014-02-20: 4 [IU] via SUBCUTANEOUS
  Administered 2014-02-20 (×3): 3 [IU] via SUBCUTANEOUS
  Administered 2014-02-21 (×3): 4 [IU] via SUBCUTANEOUS
  Administered 2014-02-21: 3 [IU] via SUBCUTANEOUS
  Administered 2014-02-21 – 2014-02-22 (×3): 4 [IU] via SUBCUTANEOUS
  Administered 2014-02-22: 7 [IU] via SUBCUTANEOUS
  Administered 2014-02-22: 3 [IU] via SUBCUTANEOUS
  Administered 2014-02-22 (×2): 4 [IU] via SUBCUTANEOUS
  Administered 2014-02-22: 11 [IU] via SUBCUTANEOUS
  Administered 2014-02-23: 4 [IU] via SUBCUTANEOUS
  Administered 2014-02-23: 3 [IU] via SUBCUTANEOUS
  Administered 2014-02-23: 7 [IU] via SUBCUTANEOUS
  Administered 2014-02-23 – 2014-02-24 (×4): 4 [IU] via SUBCUTANEOUS
  Administered 2014-02-24 – 2014-02-25 (×4): 3 [IU] via SUBCUTANEOUS
  Administered 2014-02-25 – 2014-02-26 (×6): 4 [IU] via SUBCUTANEOUS
  Administered 2014-02-26 (×2): 3 [IU] via SUBCUTANEOUS

## 2014-02-12 MED ORDER — FENTANYL CITRATE 0.05 MG/ML IJ SOLN
INTRAMUSCULAR | Status: AC
  Administered 2014-02-12: 100 ug
  Filled 2014-02-12: qty 4

## 2014-02-12 MED ORDER — FENTANYL CITRATE 0.05 MG/ML IJ SOLN
25.0000 ug/h | INTRAMUSCULAR | Status: DC
  Administered 2014-02-12 (×2): 150 ug/h via INTRAVENOUS
  Administered 2014-02-13: 200 ug/h via INTRAVENOUS
  Administered 2014-02-14 (×2): 250 ug/h via INTRAVENOUS
  Administered 2014-02-15: 275 ug/h via INTRAVENOUS
  Administered 2014-02-16: 150 ug/h via INTRAVENOUS
  Filled 2014-02-12 (×7): qty 50

## 2014-02-12 MED ORDER — DOCUSATE SODIUM 50 MG/5ML PO LIQD
100.0000 mg | Freq: Two times a day (BID) | ORAL | Status: DC
  Administered 2014-02-12 – 2014-02-15 (×8): 100 mg
  Filled 2014-02-12 (×10): qty 10

## 2014-02-12 MED ORDER — NALOXONE HCL 0.4 MG/ML IJ SOLN
0.4000 mg | Freq: Once | INTRAMUSCULAR | Status: AC
  Administered 2014-02-12: 0.4 mg via INTRAVENOUS

## 2014-02-12 MED ORDER — PANTOPRAZOLE SODIUM 40 MG PO PACK
40.0000 mg | PACK | Freq: Every day | ORAL | Status: DC
  Administered 2014-02-12 – 2014-02-15 (×4): 40 mg
  Filled 2014-02-12 (×5): qty 20

## 2014-02-12 MED ORDER — SODIUM CHLORIDE 0.9 % IV BOLUS (SEPSIS)
500.0000 mL | Freq: Once | INTRAVENOUS | Status: AC
  Administered 2014-02-12: 500 mL via INTRAVENOUS

## 2014-02-12 MED ORDER — FENTANYL BOLUS VIA INFUSION
50.0000 ug | INTRAVENOUS | Status: DC | PRN
  Filled 2014-02-12: qty 50

## 2014-02-12 MED ORDER — DEXTROSE 5 % IV SOLN
2.0000 ug/min | INTRAVENOUS | Status: DC
  Administered 2014-02-12: 20 ug/min via INTRAVENOUS
  Administered 2014-02-13: 5 ug/min via INTRAVENOUS
  Filled 2014-02-12 (×4): qty 4

## 2014-02-12 MED ORDER — MIDAZOLAM HCL 2 MG/2ML IJ SOLN
2.0000 mg | INTRAMUSCULAR | Status: DC | PRN
  Administered 2014-02-13 – 2014-02-15 (×4): 2 mg via INTRAVENOUS
  Filled 2014-02-12 (×4): qty 2

## 2014-02-12 MED ORDER — SODIUM CHLORIDE 0.9 % IV BOLUS (SEPSIS)
1000.0000 mL | Freq: Once | INTRAVENOUS | Status: AC
  Administered 2014-02-12: 1000 mL via INTRAVENOUS

## 2014-02-12 NOTE — Progress Notes (Signed)
ANTICOAGULATION CONSULT NOTE - Initial Consult  Pharmacy Consult for Heparin Indication: atrial fibrillation  Allergies  Allergen Reactions  . Codeine     Headache     Patient Measurements: Height: 5' 4"  (162.6 cm) Weight: 289 lb 14.5 oz (131.5 kg) IBW/kg (Calculated) : 59.2 Heparin Dosing Weight: 91.3 kg  Vital Signs: Temp: 96.4 F (35.8 C) (02/10 1100) Temp Source: Core (Comment) (02/10 1100) BP: 123/70 mmHg (02/10 1100) Pulse Rate: 46 (02/10 1100)  Labs:  Recent Labs  02/10/14 1401 02/10/14 1402 02/11/14 0750 02/12/14 0545 02/12/14 0739  HGB 10.3*  --  10.2* 9.9*  --   HCT 36.8*  --  38.3* 37.5*  --   PLT 237  --  229 268  --   LABPROT  --  24.5* 23.4* 19.6*  --   INR  --  2.19* 2.06* 1.64*  --   CREATININE 1.58*  --  1.62*  --  2.79*  TROPONINI <0.03  --   --   --   --     Estimated Creatinine Clearance: 34.2 mL/min (by C-G formula based on Cr of 2.79).   Medical History: Past Medical History  Diagnosis Date  . Diabetes   . Colon cancer   . Hypertension   . Sleep apnea   . Atrial fibrillation   . Hyperlipidemia   . CHF (congestive heart failure)     Assessment: 64 year old male on chronic warfarin prior to admission for atrial fibrillation now transferred to ICU for respiratory distress and intubated to change to IV heparin. INR today is 1.64 and last dose of warfarin was on 2/9 at 1800.  CBC stable. No bleeding reported. SCr up today at 2.79 with estimated CrCl ~ 30-40m/min.   Goal of Therapy:  Heparin level 0.3-0.7 units/ml Monitor platelets by anticoagulation protocol: Yes   Plan:  Heparin drip at 1350 units/hr (no bolus with elevated INR). Heparin level in 6 hours Daily heparin level and CBC while on therapy.  Monitor for signs and symptoms of bleeding.   JSloan Leiter PharmD, BCPS Clinical Pharmacist [phone]/10/2014,11:13 AM

## 2014-02-12 NOTE — Procedures (Signed)
Intubation Procedure Note - ciff blew on first , no volumes desat William Flores [id number] May 24, 1950  Procedure: Intubation Indications: Respiratory insufficiency  Procedure Details Consent: Unable to obtain consent because of emergent medical necessity. Time Out: Verified patient identification, verified procedure, site/side was marked, verified correct patient position, special equipment/implants available, medications/allergies/relevent history reviewed, required imaging and test results available.  Performed  Maximum sterile technique was used including gloves, gown, hand hygiene and mask.  MAC and 3  I placed a tube xchanger and then removed old tube Confirmed with glide exchanger through cords then placed new ett, easily, noresistance and no cord injury direct visualization  Evaluation Hemodynamic Status: BP stable throughout; O2 sats: stable throughout Patient's Current Condition: stable Complications: No apparent complications Patient did tolerate procedure well. Chest X-ray ordered to verify placement.  CXR: pending.   [name] 02/12/2014

## 2014-02-12 NOTE — Procedures (Signed)
Arterial Catheter Insertion Procedure Note William Flores [id number] Jun 15, 1950  Procedure: Insertion of Arterial Catheter  Indications: Blood pressure monitoring and Frequent blood sampling  Procedure Details Consent: Risks of procedure as well as the alternatives and risks of each were explained to the (patient/caregiver).  Consent for procedure obtained. Time Out: Verified patient identification, verified procedure, site/side was marked, verified correct patient position, special equipment/implants available, medications/allergies/relevent history reviewed, required imaging and test results available.  Performed  Maximum sterile technique was used including antiseptics, cap, gloves, gown, hand hygiene, mask and sheet. Skin prep: Chlorhexidine; local anesthetic administered 20 gauge catheter was inserted into right radial artery using the Seldinger technique.  Evaluation Blood flow good; BP tracing good. Complications: No apparent complications.   Beatris Si D 02/12/2014

## 2014-02-12 NOTE — Progress Notes (Signed)
ANTICOAGULATION CONSULT NOTE - Follow Up Consult  Pharmacy Consult for Heparin Indication: atrial fibrillation  Patient Measurements: Height: 5' 4"  (162.6 cm) Weight: 289 lb 14.5 oz (131.5 kg) IBW/kg (Calculated) : 59.2 Heparin Dosing Weight: 91.3 kg  Vital Signs: Temp: 87.7 F (30.9 C) (02/10 1830) Temp Source: Oral (02/10 1616) BP: 98/47 mmHg (02/10 1910) Pulse Rate: 84 (02/10 1910)  Labs:  Recent Labs  02/10/14 1401 02/10/14 1402 02/11/14 0750 02/12/14 0545 02/12/14 0739 02/12/14 1010 02/12/14 1610 02/12/14 1838  HGB 10.3*  --  10.2* 9.9*  --   --   --   --   HCT 36.8*  --  38.3* 37.5*  --   --   --   --   PLT 237  --  229 268  --   --   --   --   LABPROT  --  24.5* 23.4* 19.6*  --   --   --   --   INR  --  2.19* 2.06* 1.64*  --   --   --   --   HEPARINUNFRC  --   --   --   --   --   --   --  0.13*  CREATININE 1.58*  --  1.62*  --  2.79*  --   --   --   TROPONINI <0.03  --   --   --   --  <0.03 <0.03  --     Estimated Creatinine Clearance: 34.2 mL/min (by C-G formula based on Cr of 2.79).  Assessment:   Initial heparin level is low (0.13) on 1350 units/hr.   Coumadin on hold.  Goal of Therapy:  Heparin level 0.3-0.7 units/ml Monitor platelets by anticoagulation protocol: Yes   Plan:   Increase heparin drip to 1600 units/hr.  Next heparin level in ~ 6hrs.  Daily heparin level and CBC while on heparin.  Daily PT/INR.  Arty Baumgartner, [place] Pager: [phone] 02/12/2014,7:30 PM

## 2014-02-12 NOTE — Procedures (Signed)
Central Venous Catheter Insertion Procedure Note ISSAI WERLING [id number] [birth year]  Procedure: Insertion of Central Venous Catheter Indications: Assessment of intravascular volume, Drug and/or fluid administration and Frequent blood sampling  Procedure Details Consent: Unable to obtain consent because of emergent medical necessity.   Time Out: Verified patient identification, verified procedure, site/side was marked, verified correct patient position, special equipment/implants available, medications/allergies/relevent history reviewed, required imaging and test results available.  Performed  Maximum sterile technique was used including antiseptics, cap, gloves, gown, hand hygiene, mask and sheet. Skin prep: Chlorhexidine; local anesthetic administered A antimicrobial bonded/coated triple lumen catheter was placed to 20 cm, sutured x 2, in the left internal jugular vein using the Seldinger technique.  Evaluation Blood flow good Complications: No apparent complications Patient did tolerate procedure well. Chest X-ray ordered to verify placement.  CXR: pending.   Procedure performed under direct supervision of Dr. [name] and with ultrasound guidance for real time vessel cannulation.       Noe Gens, NP-C [facility] Pgr: [phone] or [phone]   02/12/2014, 10:22 AM  [place] [facility]  [name]. [name], MD, [place] Pgr: [facility] '

## 2014-02-12 NOTE — Progress Notes (Signed)
UR Completed.  [phone]  

## 2014-02-12 NOTE — Progress Notes (Signed)
INITIAL NUTRITION ASSESSMENT  DOCUMENTATION CODES Per approved criteria  -Morbid Obesity   INTERVENTION: -Continue Vital High Protein via OGT, increase to goal rate of 35 ml/hr (840 ml) and Pro stat 30 ml TID providing 1340 kcal, 149 g protein and 702 ml of free water daily.  NUTRITION DIAGNOSIS: Inadequate oral intake related to inability to eat as evidenced by NPO status.   Goal: Enteral nutrition to provide 60-70% of estimated calorie needs (22-25 kcals/kg ideal body weight) and 100% of estimated protein needs, based on ASPEN guidelines for hypocaloric, high protein feeding in critically ill obese individuals.  Monitor:  Pt tf tolerance, weight trends, labs  Reason for Assessment: Consulter per MD/TF initiation  64 y.o. male  Admitting Dx: Acute on chronic diastolic heart failure  ASSESSMENT: Pt hx of DM, A.fib, sleep apnea, CHF, morbid obesity.  Pt came to ED with SOB associated with 10 lb wt gain and abdominal fullness.   Pt currently on ventilator support: MVe: 12.0  Temp (24hrs), Avg:97.8 F (36.6 C), Min:96.4 F (35.8 C), Max:98.7 F (37.1 C)   Propofol: none  Height: Ht Readings from Last 1 Encounters:  02/10/14 5' 4"  (1.626 m)    Weight: Wt Readings from Last 1 Encounters:  02/12/14 289 lb 14.5 oz (131.5 kg)    Ideal Body Weight: 130 lbs  % Ideal Body Weight: 220%  Wt Readings from Last 10 Encounters:  02/12/14 289 lb 14.5 oz (131.5 kg)  02/10/14 294 lb 12.8 oz (133.72 kg)  12/19/13 284 lb (128.822 kg)  11/29/13 282 lb (127.914 kg)  11/21/13 273 lb 14.4 oz (124.24 kg)  11/19/13 273 lb 6.4 oz (124.013 kg)  11/13/13 277 lb 12.8 oz (126.009 kg)  11/12/13 281 lb 12.8 oz (127.824 kg)  11/05/13 279 lb (126.554 kg)  10/28/13 301 lb 12.8 oz (136.896 kg)    Usual Body Weight: unable to obtain  % Usual Body Weight: unable to obtain  BMI:  Body mass index is 49.74 kg/(m^2).  Estimated Nutritional Needs: Kcal: 2239 kcal Protein: 145-160 g  protein Fluid: 1.2 L/day  Skin: Dry, intact  Diet Order: Diet NPO time specified  EDUCATION NEEDS: -No education needs identified at this time   Intake/Output Summary (Last 24 hours) at 02/12/14 1456 Last data filed at 02/12/14 1300  Gross per 24 hour  Intake 4352.26 ml  Output   1515 ml  Net 2837.26 ml    Last BM: PTA   Labs:   Recent Labs Lab 02/10/14 1401 02/11/14 0750 02/12/14 0739 02/12/14 1240  NA 140 139 140  --   K 3.8 3.8 4.1  --   CL 98 97 101  --   CO2 37* 32 29  --   BUN 46* 48* 56*  --   CREATININE 1.58* 1.62* 2.79*  --   CALCIUM 9.1 9.2 8.0*  --   MG  --   --   --  2.1  PHOS  --   --   --  5.9*  GLUCOSE 146* 184* 219*  --     CBG (last 3)   Recent Labs  02/12/14 0645 02/12/14 0858 02/12/14 1216  GLUCAP 191* 226* 229*    Scheduled Meds: . antiseptic oral rinse  7 mL Mouth Rinse QID  . aspirin  81 mg Oral Daily  . chlorhexidine  15 mL Mouth Rinse BID  . docusate  100 mg Per Tube BID  . etomidate  20 mg Intravenous Once  . feeding supplement (PRO-STAT SUGAR FREE  64)  30 mL Per Tube BID  . feeding supplement (VITAL HIGH PROTEIN)  1,000 mL Per Tube Q24H  . insulin aspart  0-20 Units Subcutaneous 6 times per day  . ketotifen  1 drop Both Eyes BID  . naloxone      . pantoprazole sodium  40 mg Per Tube Daily  . potassium chloride  20 mEq Per Tube BID    Continuous Infusions: . sodium chloride 10 mL/hr at 02/12/14 1300  . fentaNYL infusion INTRAVENOUS 175 mcg/hr (02/12/14 1300)  . heparin 1,350 Units/hr (02/12/14 1302)  . norepinephrine (LEVOPHED) Adult infusion 4 mcg/min (02/12/14 1330)    Past Medical History  Diagnosis Date  . Diabetes   . Colon cancer   . Hypertension   . Sleep apnea   . Atrial fibrillation   . Hyperlipidemia   . CHF (congestive heart failure)     Past Surgical History  Procedure Laterality Date  . Colon resection    . Circumcision    . Colonoscopy N/A 09/21/2012    Procedure: COLONOSCOPY;  Surgeon:  Inda Castle, MD;  Location: [facility] ENDOSCOPY;  Service: Endoscopy;  Laterality: N/A;  . US echocardiography  03/04/2010    abnormal study    Elmer Picker MS Dietetic Intern Pager Number [phone]

## 2014-02-12 NOTE — Progress Notes (Signed)
Taking over patient care 7am this morning.   Was called 8am that patient is lethargic and somnolent, he refused his CPAP for OSA last night, examined patient, no focal deficits, pupils bilaterally symmetrical and reactive to light.   Ordered a stat ABG suggestive of acute on chronic hypercapnic/hypoxic respiratory failure. PCO2 over the top limit. Unsure if patient will be able to maintain airway. Will be transferred to ICU likely will require intubation for the near short-term. Called Dr. Nicholaus Corolla her line was busy, informed the other listed number of [name] about the plan. Critical care bedside taking patient to ICU.

## 2014-02-12 NOTE — Progress Notes (Signed)
With bedside reporting this morning, pt was lethargic and not making coherent sentences. SpO2 checked since patient was asleep and refusing to wear CPAP, his Spo2 was in the 70's. When trying to place CPAP onto patient, he dropped into the 40's. NRB placed on patient, respiratory and rapid response called. Patient still remains lethargic, Spo2 has improved to high 90's. Will continue to monitor. Tresa Endo

## 2014-02-12 NOTE — Consult Note (Signed)
PULMONARY / CRITICAL CARE MEDICINE   Name: William Flores MRN: [id number] DOB: 11/27/1950    ADMISSION DATE:  02/10/2014 CONSULTATION DATE:  02/12/14  REFERRING MD :  Dr. Candiss Norse   CHIEF COMPLAINT:  Acute Respiratory Failure / AMS   INITIAL PRESENTATION: 64 y/o M with a PMH of Afib, DM, OSA on CPAP, dCHF admitted 2/8 with weight gain, progressive orthopnea, non-productive cough.  Work up concerning for decompensated CHF / AKI and patient admitted to TRH/Floor.  Patient decompensated with hypercarbic respiratory failure 2/10 and was emergently transferred to ICU and intubated.    STUDIES:   SIGNIFICANT EVENTS: 2/08  Admit with AKI / decompensated CHF 2/10  Decompensated on floor with hypercarbic respiratory failure, to ICU for emergent intubation.    HISTORY OF PRESENT ILLNESS:  64 y/o M with a PMH of Afib, HLD, DM, OSA on CPAP, dCHF who presented to [facility] on 2/8 from [place] with SOB and weight gain.  On admission, the patient reported a 10 lb weight gain with swelling up to the abdomen, worsening orthopnea and non-productive cough.  He was found to be hypoxic on admission with saturations in th 70's.  Patient reported compliance with home diuretic regimen.  Work up was consistent with decompensated CHF and acute kidney injury.  He was admitted per [facility] for diuresis and close monitoring of renal function. The patient apparently had been refusing to wear his bipap during admission.  On 2/10, he decompensated with worsening of hypercarbic respiratory failure and was emergently transferred to ICU for intubation.  PCCM consulted for evaluation.  The patient was intubated and TLC was placed.    PAST MEDICAL HISTORY :   has a past medical history of Diabetes; Colon cancer; Hypertension; Sleep apnea; Atrial fibrillation; Hyperlipidemia; and CHF (congestive heart failure).  has past surgical history that includes Colon resection; Circumcision; Colonoscopy (N/A, 09/21/2012); and  US echocardiography (03/04/2010).   HOME MEDICATIONS: Prior to Admission medications   Medication Sig Start Date End Date Taking? Authorizing Provider  allopurinol (ZYLOPRIM) 100 MG tablet TAKE 1 TABLET (100 MG TOTAL) BY MOUTH DAILY. 01/26/14  Yes Lysbeth Penner, FNP  allopurinol (ZYLOPRIM) 300 MG tablet TAKE 1 TABLET (300 MG TOTAL) BY MOUTH DAILY. ALSO TAKE 100MG DAILY TO EQUAL 400MG TOTAL. 01/01/14  Yes Wardell Honour, MD  aspirin 81 MG tablet Take 81 mg by mouth daily.   Yes Historical Provider, MD  azelastine (OPTIVAR) 0.05 % ophthalmic solution Place 2 drops into both eyes 2 (two) times daily. 04/19/13  Yes Vernie Shanks, MD  benzonatate (TESSALON) 100 MG capsule Take 1 capsule (100 mg total) by mouth 3 (three) times daily as needed for cough. 02/05/14  Yes Claretta Fraise, MD  bisoprolol (ZEBETA) 5 MG tablet TAKE 0.5 TABLETS (2.5 MG TOTAL) BY MOUTH DAILY. 01/16/14  Yes Lysbeth Penner, FNP  diltiazem (CARDIZEM CD) 360 MG 24 hr capsule TAKE 1 CAPSULE (360 MG TOTAL) BY MOUTH DAILY. 12/03/13  Yes Chipper Herb, MD  ferrous sulfate 325 (65 FE) MG tablet Take 1 tablet (325 mg total) by mouth daily with breakfast. 02/05/14  Yes Tammy Eckard, PHARMD  furosemide (LASIX) 40 MG tablet Take 80 mg by mouth 2 (two) times daily. Take 59m q AM, 40 mg hs 01/18/14  Yes Historical Provider, MD  glucose blood test strip Use to check BG daily 10/07/13  Yes Tammy Eckard, PHARMD  lisinopril (PRINIVIL,ZESTRIL) 20 MG tablet TAKE 1 TABLET (20 MG TOTAL) BY MOUTH  DAILY. 12/01/13  Yes Lysbeth Penner, FNP  metolazone (ZAROXOLYN) 2.5 MG tablet Take 1 tablet (2.5 mg total) by mouth daily. Patient taking differently: Take 2.5 mg by mouth daily. Take Monday,Wednesday and Friday 11/05/13  Yes Annita Brod, MD  omeprazole (PRILOSEC) 20 MG capsule TAKE 1 CAPSULE (20 MG TOTAL) BY MOUTH DAILY. 12/17/13  Yes Lysbeth Penner, FNP  [facility] DELICA LANCETS 40J MISC 1 each by Does not apply route daily. Use one lancet to check BG  daily 09/13/12  Yes Tammy Eckard, PHARMD  sitaGLIPtin-metformin (JANUMET) 50-1000 MG per tablet Take 1 tablet by mouth 2 (two) times daily with a meal. 08/05/13  Yes Lysbeth Penner, FNP  warfarin (COUMADIN) 5 MG tablet TAKE 1 TO 2 TABLETS DAILY AS DIRECTED BY ANTICOAGULATION CLINIC Patient taking differently: Take 7.23m daily exc 151mon Mon / Fri 01/25/14  Yes Tammy Eckard, PHARMD   Allergies  Allergen Reactions  . Codeine     Headache     FAMILY HISTORY:  has no family status information on file.    SOCIAL HISTORY:  reports that he quit smoking about 30 years ago. He has never used smokeless tobacco. He reports that he does not drink alcohol or use illicit drugs.  REVIEW OF SYSTEMS:  Unable to complete as patient is altered on mechanical ventilation.   SUBJECTIVE:   VITAL SIGNS: Temp:  [97.8 F (36.6 C)-98.6 F (37 C)] 98.6 F (37 C) (02/10 0902) Pulse Rate:  [55-110] 95 (02/10 0930) Resp:  [14-28] 24 (02/10 0930) BP: (57-125)/(34-70) 72/48 mmHg (02/10 0915) SpO2:  [83 %-100 %] 100 % (02/10 0930) FiO2 (%):  [100 %] 100 % (02/10 0915) Weight:  [285 lb 1.6 oz (129.321 kg)-289 lb 14.5 oz (131.5 kg)] 289 lb 14.5 oz (131.5 kg) (02/10 0930)   HEMODYNAMICS:     VENTILATOR SETTINGS: Vent Mode:  [-] PRVC FiO2 (%):  [100 %] 100 % Set Rate:  [24 bmp] 24 bmp Vt Set:  [500 mL] 500 mL PEEP:  [10 cmH20] 10 cmH20   INTAKE / OUTPUT:  Intake/Output Summary (Last 24 hours) at 02/12/14 [id number] data filed at 02/12/14 0915  Gross per 24 hour  Intake   1760 ml  Output   1025 ml  Net    735 ml    PHYSICAL EXAMINATION: General:  Morbidly obese male in NAD on vent (was unresponsives pre ett) agonal Neuro:  Intermittent agitation, moves all ext's spontaneously HEENT:  OETT, mm pink/moist, short/thick neck.  Poor dentition.  Cardiovascular:  s1s2 irr irr, no m/r/g Lungs:  resp's even/non-labored on vent, lungs bilaterally with few basilar crackles, diminished Abdomen:  Obese, soft,  bsx4 active  Musculoskeletal:  No acute deformities Skin:  Warm/dry, 1+ BLE pitting edema  LABS:  CBC  Recent Labs Lab 02/10/14 1401 02/11/14 0750 02/12/14 0545  WBC 6.3 6.8 8.3  HGB 10.3* 10.2* 9.9*  HCT 36.8* 38.3* 37.5*  PLT 237 229 268   Coag's  Recent Labs Lab 02/10/14 1402 02/11/14 0750 02/12/14 0545  INR 2.19* 2.06* 1.64*   BMET  Recent Labs Lab 02/10/14 1401 02/11/14 0750  NA 140 139  K 3.8 3.8  CL 98 97  CO2 37* 32  BUN 46* 48*  CREATININE 1.58* 1.62*  GLUCOSE 146* 184*   Electrolytes  Recent Labs Lab 02/10/14 1401 02/11/14 0750  CALCIUM 9.1 9.2   Sepsis Markers No results for input(s): LATICACIDVEN, PROCALCITON, O2SATVEN in the last 168 hours.   ABG  Recent  Labs Lab 02/12/14 0820  PHART 7.071*  PCO2ART CRITICAL RESULT CALLED TO, READ BACK BY AND VERIFIED WITH:  PO2ART 90.3   Liver Enzymes No results for input(s): AST, ALT, ALKPHOS, BILITOT, ALBUMIN in the last 168 hours.   Cardiac Enzymes  Recent Labs Lab 02/10/14 1401  TROPONINI <0.03   Glucose  Recent Labs Lab 02/10/14 2138 02/11/14 1057 02/11/14 1622 02/11/14 2207 02/12/14 0645 02/12/14 0858  GLUCAP 193* 183* 161* 156* 191* 226*    Imaging No results found.   ASSESSMENT / PLAN:  PULMONARY OETT 2/10 >>  A: Acute Hypercarbic Respiratory Failure - in the setting of decompensated dCHF, AKI, untreated OSA OSA - suspect non-compliant with CPAP Pulonary edema, diastolic HF acute Former Smoker  P:   PRVC 8cc/kg Wean PEEP / FiO2 for sats > 90%, peep needed to 10 BIPAP  post extubation  Daily SBT / WUA if correction PH in am and O2 needs improved Trend CXR  Will need lasix once BP improved cvp to r/o ards (doubt)  CARDIOVASCULAR CVL L IJ TLC 2/10 >>  A:  Hypotension - likely sedation related CHF - suspect acute decompensation, ECHO 08/2013 EF 55-60%, unable to calculate PA pressure Atrial Fibrillation - chronic, on coumadin  P:  Levophed for MAP >  65 NS bolus now, then KVO  Assess CVP Q4  Assess ECHO, EKG, Cortisol, Troponin, BNP Hold home bisoprolol, cardizem, lasix, metolazone, lisinopril Continue ASA  Hold coumadin 2/10, transition to heparin gtt per pharmacy  ASAp will resume lasix  RENAL A:   Acute on Chronic Kidney Disease Hypokalemia P:   Hold further lasix for now, assess cvp NS @ KVO  Trend BMP Replace electrolytes as indicated  Required some bolus post ETT meds hypotention  GASTROINTESTINAL A:   Morbid Obesity  Vent Associated Dysphagia  P:   NPO OGT Consider TF today PPI  HEMATOLOGIC A:   Anemia - recent work up per primary MD in process, hx FOB +  P:  DVT Px: heparin gtt (previously on coumadin as outpatient) Trend CBC   INFECTIOUS A:   No acute process  P:   Monitor off abx Trend fever curve / WBC  ENDOCRINE A:   DM Gout P:   SSI  Hold home allopurinol   NEUROLOGIC A:   Acute Metabolic Encephalopathy  P:   RASS goal: -1 Fentanyl gtt May need int versed  FAMILY  - Updates: No family available at bedside.  Attempted all numbers in chart with no response.    Noe Gens, NP-C [facility] Pgr: [phone] or [phone]   02/12/2014, 9:38 AM   STAFF NOTE: I, Merrie Roof, MD FACP have personally reviewed patient's available data, including medical history, events of note, physical examination and test results as part of my evaluation. I have discussed with resident/NP and other care providers such as pharmacist, RN and RRT. In addition, I personally evaluated patient and elicited key findings of: distress, near arrest on floor, unresponsives, ett placed, will need lasix, after BP improves, low threshold ABX, peep needed, start TF today, assess cvp, keep plat less 30  The patient is critically ill with multiple organ systems failure and requires high complexity decision making for assessment and support, frequent evaluation and titration of therapies,  application of advanced monitoring technologies and extensive interpretation of multiple databases.   Critical Care Time devoted to patient care services described in this note is35 Minutes. This time reflects time of care of this signee: Quillian Quince  [name], [place]. This critical care time does not reflect procedure time, or teaching time or supervisory time of PA/NP/Med student/Med Resident etc but could involve care discussion time. Rest per NP/medical resident whose note is outlined above and that I agree with   [name]. [name], MD, [place] Pgr: [facility] 02/12/2014 12:24 PM

## 2014-02-12 NOTE — Progress Notes (Signed)
  Echocardiogram 2D Echocardiogram has been performed.  William Flores 02/12/2014, 4:47 PM

## 2014-02-12 NOTE — Progress Notes (Signed)
Dr. Wallace Keller at bedside ordered bipap for pt, pt will be transferred to ICU for management, 0.4 of Narcan given, pt on non rebreather sats 96%, BP WNL, pt stable at this time

## 2014-02-12 NOTE — Progress Notes (Signed)
OT Cancellation Note  Patient Details Name: William TEBBETTS MRN: [id number] DOB: 05-Jul-1950   Cancelled Treatment:    Reason Eval/Treat Not Completed: Medical issues which prohibited therapy. Pt with acute respiratory failure. Transferred to ICU and intubated. Will continue to follow.  [name] 02/12/2014, 2:10 PM

## 2014-02-12 NOTE — Significant Event (Signed)
Rapid Response Event Note  Overview: Time Called: 0037 Arrival Time: 0805 Event Type: Respiratory  Initial Focused Assessment:  Called by RN for patient unresponsive and sats in the 70's on nasal cannula 4 LPM.  Upon my arrival to patients room, RN at bedside.  Patient lying in bed moans to painful stimuli on NRB.  Dr Candiss Norse at bedside   Interventions:  Narcan given with no response.  ABG done, Dr. [name] at bedside, Abg results given to [facility] ph 7.07, Co2 too high to rad, Po2 90.3, Sat 91% on NRB.  Patient transported to 2 M 15 on monitor and ambued with 100% oxygen.  Intubated on arrival to 2 m by Dr. [name]   Event Summary: transported to 2 M15   at      at          [facility], Harlin Rain

## 2014-02-12 NOTE — Procedures (Signed)
Intubation Procedure Note William Flores [id number] 10-17-1950  Procedure: Intubation Indications: Respiratory insufficiency  Procedure Details Consent: Unable to obtain consent because of emergent medical necessity. Time Out: Verified patient identification, verified procedure, site/side was marked, verified correct patient position, special equipment/implants available, medications/allergies/relevent history reviewed, required imaging and test results available.  Performed  Maximum sterile technique was used including gloves, gown, hand hygiene, mask and sheet.  MAC and 4    Evaluation Hemodynamic Status: BP stable throughout; O2 sats: stable throughout Patient's Current Condition: stable Complications: No apparent complications Patient did tolerate procedure well. Chest X-ray ordered to verify placement.  CXR: pending.   William Flores 02/12/2014  William Flores  William Flores. William Mould, MD, William Flores Pgr: [facility]

## 2014-02-12 NOTE — Progress Notes (Signed)
Inpatient Diabetes Program Recommendations  AACE/ADA: New Consensus Statement on Inpatient Glycemic Control (2013)  Target Ranges:  Prepandial:   less than 140 mg/dL      Peak postprandial:   less than 180 mg/dL (1-2 hours)      Critically ill patients:  140 - 180 mg/dL   Reason for Visit: Hyperglycemia  Diabetes history: DM2 Outpatient Diabetes medications: Janumet 50/1000 mg bid Current orders for Inpatient glycemic control: Novolog resistant tidwc and hs  Results for William Flores, William Flores (MRN [id number]) as of 02/12/2014 10:00  Ref. Range 02/12/2014 08:58  Glucose-Capillary Latest Range: 70-99 mg/dL 226 (H)    Inpatient Diabetes Program Recommendations Correction (SSI): Change Novolog to resistant Q4H while NPO  Will continue to follow. Thank you. Lorenda Peck, RD, LDN, CDE Inpatient Diabetes Coordinator [phone]

## 2014-02-13 ENCOUNTER — Inpatient Hospital Stay (HOSPITAL_COMMUNITY): Payer: BLUE CROSS/BLUE SHIELD

## 2014-02-13 DIAGNOSIS — J9601 Acute respiratory failure with hypoxia: Secondary | ICD-10-CM

## 2014-02-13 DIAGNOSIS — I5033 Acute on chronic diastolic (congestive) heart failure: Secondary | ICD-10-CM

## 2014-02-13 LAB — CBC
HCT: 35.2 % — ABNORMAL LOW (ref 39.0–52.0)
Hemoglobin: 9.9 g/dL — ABNORMAL LOW (ref 13.0–17.0)
MCH: 22.9 pg — ABNORMAL LOW (ref 26.0–34.0)
MCHC: 28.1 g/dL — ABNORMAL LOW (ref 30.0–36.0)
MCV: 81.3 fL (ref 78.0–100.0)
PLATELETS: 251 10*3/uL (ref 150–400)
RBC: 4.33 MIL/uL (ref 4.22–5.81)
RDW: 23 % — ABNORMAL HIGH (ref 11.5–15.5)
WBC: 9.2 10*3/uL (ref 4.0–10.5)

## 2014-02-13 LAB — GLUCOSE, CAPILLARY
GLUCOSE-CAPILLARY: 170 mg/dL — AB (ref 70–99)
GLUCOSE-CAPILLARY: 177 mg/dL — AB (ref 70–99)
GLUCOSE-CAPILLARY: 196 mg/dL — AB (ref 70–99)
Glucose-Capillary: 166 mg/dL — ABNORMAL HIGH (ref 70–99)
Glucose-Capillary: 185 mg/dL — ABNORMAL HIGH (ref 70–99)
Glucose-Capillary: 172 mg/dL — ABNORMAL HIGH (ref 70–99)

## 2014-02-13 LAB — PROTIME-INR
INR: 1.68 — AB (ref 0.00–1.49)
Prothrombin Time: 19.9 seconds — ABNORMAL HIGH (ref 11.6–15.2)

## 2014-02-13 LAB — CORTISOL: CORTISOL PLASMA: 24.4 ug/dL

## 2014-02-13 LAB — BASIC METABOLIC PANEL
Anion gap: 7 (ref 5–15)
BUN: 50 mg/dL — AB (ref 6–23)
CHLORIDE: 102 mmol/L (ref 96–112)
CO2: 32 mmol/L (ref 19–32)
CREATININE: 1.59 mg/dL — AB (ref 0.50–1.35)
Calcium: 8.8 mg/dL (ref 8.4–10.5)
GFR calc non Af Amer: 45 mL/min — ABNORMAL LOW (ref >90)
GFR, EST AFRICAN AMERICAN: 52 mL/min — AB (ref >90)
Glucose, Bld: 169 mg/dL — ABNORMAL HIGH (ref 70–99)
Potassium: 4 mmol/L (ref 3.5–5.1)
Sodium: 141 mmol/L (ref 135–145)

## 2014-02-13 LAB — MAGNESIUM
Magnesium: 2.1 mg/dL (ref 1.5–2.5)
Magnesium: 2.2 mg/dL (ref 1.5–2.5)

## 2014-02-13 LAB — BRAIN NATRIURETIC PEPTIDE: B NATRIURETIC PEPTIDE 5: 79.6 pg/mL (ref 0.0–100.0)

## 2014-02-13 LAB — PHOSPHORUS
PHOSPHORUS: 2.9 mg/dL (ref 2.3–4.6)
Phosphorus: 3 mg/dL (ref 2.3–4.6)

## 2014-02-13 LAB — HEPARIN LEVEL (UNFRACTIONATED)
Heparin Unfractionated: <0.1 IU/mL — ABNORMAL LOW (ref 0.30–0.70)
Heparin Unfractionated: 0.33 IU/mL (ref 0.30–0.70)
Heparin Unfractionated: 0.32 IU/mL (ref 0.30–0.70)

## 2014-02-13 LAB — PROCALCITONIN: PROCALCITONIN: 0.14 ng/mL

## 2014-02-13 MED ORDER — HEPARIN BOLUS VIA INFUSION
2000.0000 [IU] | Freq: Once | INTRAVENOUS | Status: AC
  Administered 2014-02-13: 2000 [IU] via INTRAVENOUS
  Filled 2014-02-13: qty 2000

## 2014-02-13 MED ORDER — POTASSIUM CHLORIDE 20 MEQ/15ML (10%) PO SOLN
40.0000 meq | Freq: Three times a day (TID) | ORAL | Status: DC | PRN
  Administered 2014-02-14: 40 meq
  Filled 2014-02-13 (×2): qty 30

## 2014-02-13 MED ORDER — FUROSEMIDE 10 MG/ML IJ SOLN
40.0000 mg | Freq: Three times a day (TID) | INTRAMUSCULAR | Status: DC
  Administered 2014-02-13 – 2014-02-14 (×4): 40 mg via INTRAVENOUS
  Filled 2014-02-13 (×7): qty 4

## 2014-02-13 NOTE — Progress Notes (Addendum)
ANTICOAGULATION CONSULT NOTE - Follow Up Consult  Pharmacy Consult for Heparin Indication: atrial fibrillation  Patient Measurements: Height: 5' 4"  (162.6 cm) Weight: 289 lb 14.5 oz (131.5 kg) IBW/kg (Calculated) : 59.2 Heparin Dosing Weight: 91.3 kg  Vital Signs: Temp: 100.4 F (38 C) (02/11 0056) Temp Source: Oral (02/11 0056) BP: 111/62 mmHg (02/11 0100) Pulse Rate: 96 (02/11 0100)  Labs:  Recent Labs  02/10/14 1401 02/10/14 1402 02/11/14 0750 02/12/14 0545 02/12/14 0739 02/12/14 1010 02/12/14 1610 02/12/14 1838 02/12/14 2155 02/13/14 0213  HGB 10.3*  --  10.2* 9.9*  --   --   --   --   --   --   HCT 36.8*  --  38.3* 37.5*  --   --   --   --   --   --   PLT 237  --  229 268  --   --   --   --   --   --   LABPROT  --  24.5* 23.4* 19.6*  --   --   --   --   --   --   INR  --  2.19* 2.06* 1.64*  --   --   --   --   --   --   HEPARINUNFRC  --   --   --   --   --   --   --  0.13*  --  <0.10*  CREATININE 1.58*  --  1.62*  --  2.79*  --   --   --   --   --   TROPONINI <0.03  --   --   --   --  <0.03 <0.03  --  <0.03  --     Estimated Creatinine Clearance: 34.2 mL/min (by C-G formula based on Cr of 2.79).  Assessment: 64 yo male with h/o Afib, Coumadin on hold, for heparin  Goal of Therapy:  Heparin level 0.3-0.7 units/ml Monitor platelets by anticoagulation protocol: Yes   Plan:  Heparin 2000 units IV bolus, then increase heparin 1900 unit/hr Check heparin level in 6 hours.  Phillis Knack, PharmD, BCPS  02/13/2014,2:59 AM

## 2014-02-13 NOTE — Progress Notes (Addendum)
PT Cancellation Note/ Discharge  Patient Details Name: NIMESH RIOLO MRN: [id number] DOB: 26-Oct-1950   Cancelled Treatment:    Reason Eval/Treat Not Completed: Medical issues which prohibited therapy (Pt with intubation after order written and eval. Spoke with RN who stated vent will probably be a longer term need and best to sign off. Will sign off and await new order when medically appropriate for therapy. )   Lanetta Inch Beth 02/13/2014, 7:29 AM Elwyn Reach, [place]

## 2014-02-13 NOTE — Progress Notes (Signed)
OT Cancellation Note  Patient Details Name: William Flores MRN: [id number] DOB: November 14, 1950   Cancelled Treatment:    Reason Eval/Treat Not Completed: Medical issues which prohibited therapy (Pt remains intubated.)  [name] 02/13/2014, 8:27 AM

## 2014-02-13 NOTE — Consult Note (Signed)
PULMONARY / CRITICAL CARE MEDICINE   Name: William Flores MRN: [id number] DOB: 28-Mar-1950    ADMISSION DATE:  02/10/2014 CONSULTATION DATE:  02/12/14  REFERRING MD :  Dr. Candiss Norse   CHIEF COMPLAINT:  Acute Respiratory Failure / AMS   INITIAL PRESENTATION:    64 y/o M with a PMH of Afib, HLD, DM, OSA on CPAP, dCHF who presented to [facility] on 2/8 from [place] with SOB and weight gain.  On admission, the patient reported a 10 lb weight gain with swelling up to the abdomen, worsening orthopnea and non-productive cough.  He was found to be hypoxic on admission with saturations in th 70's.  Patient reported compliance with home diuretic regimen.  Work up was consistent with decompensated CHF and acute kidney injury.  He was admitted per [facility] for diuresis and close monitoring of renal function. The patient apparently had been refusing to wear his bipap during admission.  On 2/10, he decompensated with worsening of hypercarbic respiratory failure and was emergently transferred to ICU for intubation.  PCCM consulted for evaluation.  The patient was intubated and TLC was placed.     has a past medical history of Diabetes; Colon cancer; Hypertension; Sleep apnea; Atrial fibrillation; Hyperlipidemia; and CHF (congestive heart failure).   has past surgical history that includes Colon resection; Circumcision; Colonoscopy (N/A, 09/21/2012); and US echocardiography (03/04/2010).   SIGNIFICANT EVENTS: 2/08  Admit with AKI / decompensated CHF 2/10  Decompensated on floor with hypercarbic respiratory failure, to ICU for emergent intubation.   SUBJECTIVE:   02/13/14 - on levophed, heparin gtt and fent gtt with versed prn. Creat better. On vent. ECHO with LVEF 35%, SCVO2 77% y esterday  VITAL SIGNS: Temp:  [87.7 F (30.9 C)-100.4 F (38 C)] 98 F (36.7 C) (02/11 0759) Pulse Rate:  [59-128] 88 (02/11 0900) Resp:  [21-24] 24 (02/11 0900) BP: (88-126)/(47-84) 126/82 mmHg (02/11 0900) SpO2:   [96 %-100 %] 99 % (02/11 0900) Arterial Line BP: (87-137)/(40-76) 129/60 mmHg (02/11 0900) FiO2 (%):  [60 %-100 %] 60 % (02/11 0820) Weight:  [131.9 kg (290 lb 12.6 oz)] 131.9 kg (290 lb 12.6 oz) (02/11 0447)   HEMODYNAMICS: CVP:  [7 mmHg-20 mmHg] 12 mmHg   VENTILATOR SETTINGS: Vent Mode:  [-] PRVC FiO2 (%):  [60 %-100 %] 60 % Set Rate:  [24 bmp] 24 bmp Vt Set:  [500 mL] 500 mL PEEP:  [10 cmH20] 10 cmH20 Plateau Pressure:  [23 cmH20-26 cmH20] 26 cmH20   INTAKE / OUTPUT:  Intake/Output Summary (Last 24 hours) at 02/13/14 1107 Last data filed at 02/13/14 9935  Gross per 24 hour  Intake 1843.71 ml  Output   2855 ml  Net -1011.29 ml    PHYSICAL EXAMINATION: General:  Morbidly obese male  On vent Neuro:  Intermittent agitation, moves all ext's spontaneously - on sedation gtt HEENT:  OETT, mm pink/moist, short/thick neck.  Poor dentition.  Cardiovascular:  s1s2 irr irr, no m/r/g Lungs: sync with vent Abdomen:  Obese, soft, bsx4 active  Musculoskeletal:  No acute deformities Skin:  Warm/dry, 1+ BLE pitting edema  LABS: PULMONARY  Recent Labs Lab 02/12/14 0820 02/12/14 1140 02/12/14 1304  PHART 7.071*  --  7.451*  PCO2ART CRITICAL RESULT CALLED TO, READ BACK BY AND VERIFIED WITH:  --  46.8*  PO2ART 90.3  --  204.0*  HCO3 33.8*  --  32.6*  TCO2 37.6  --  34  O2SAT 91.4 77.3 100.0    CBC  Recent Labs Lab 02/11/14 0750 02/12/14 0545 02/13/14 0440  HGB 10.2* 9.9* 9.9*  HCT 38.3* 37.5* 35.2*  WBC 6.8 8.3 9.2  PLT 229 268 251    COAGULATION  Recent Labs Lab 02/10/14 1402 02/11/14 0750 02/12/14 0545 02/13/14 0440  INR 2.19* 2.06* 1.64* 1.68*    CARDIAC   Recent Labs Lab 02/10/14 1401 02/12/14 1010 02/12/14 1610 02/12/14 2155  TROPONINI <0.03 <0.03 <0.03 <0.03   No results for input(s): PROBNP in the last 168 hours.   CHEMISTRY  Recent Labs Lab 02/10/14 1401 02/11/14 0750 02/12/14 0739 02/12/14 1240 02/13/14 0045 02/13/14 0440  NA  140 139 140  --   --  141  K 3.8 3.8 4.1  --   --  4.0  CL 98 97 101  --   --  102  CO2 37* 32 29  --   --  32  GLUCOSE 146* 184* 219*  --   --  169*  BUN 46* 48* 56*  --   --  50*  CREATININE 1.58* 1.62* 2.79*  --   --  1.59*  CALCIUM 9.1 9.2 8.0*  --   --  8.8  MG  --   --   --  2.1 2.1  --   PHOS  --   --   --  5.9* 3.0  --    Estimated Creatinine Clearance: 60.2 mL/min (by C-G formula based on Cr of 1.59).   LIVER  Recent Labs Lab 02/10/14 1402 02/11/14 0750 02/12/14 0545 02/13/14 0440  INR 2.19* 2.06* 1.64* 1.68*     INFECTIOUS No results for input(s): LATICACIDVEN, PROCALCITON in the last 168 hours.   ENDOCRINE CBG (last 3)   Recent Labs  02/13/14 0048 02/13/14 0350 02/13/14 0751  GLUCAP 177* 166* 185*         IMAGING x48h Portable Chest Xray  02/13/2014   CLINICAL DATA:  Sleep apnea.  Shortness of breath.  EXAM: PORTABLE CHEST - 1 VIEW  COMPARISON:  02/12/2014.  FINDINGS: Endotracheal tube, left IJ line, NG tube in stable position. Cardiomegaly with bilateral pulmonary alveolar infiltrates. Bilateral effusions. No pneumothorax. No acute bony abnormality.  IMPRESSION: 1. Lines and tubes in stable position. 2. Congestive heart failure with bilateral pulmonary edema increased from prior exams. Bilateral small pleural effusions.   Electronically Signed   By: Marcello Moores  Register   On: 02/13/2014 07:31   Dg Chest Port 1 View  02/12/2014   CLINICAL DATA:  64 year old male currently admitted. Evaluate central line placement.  EXAM: PORTABLE CHEST - 1 VIEW  COMPARISON:  Prior chest x-ray 02/12/2014  FINDINGS: New left IJ approach central venous catheter. Catheter tip overlies the junction of the left innominate vein and the superior vena cava. Limited evaluation of the left hemi thorax. No pneumothorax. Bilateral pleural effusions, pulmonary edema and dense left retrocardiac opacity are all similar.  IMPRESSION: 1. The tip of the new left IJ approach central venous  catheter overlies the junction of the left innominate vein and the SVC. 2. No definite pneumothorax although evaluation of the left chest is very limited given patient positioning.   Electronically Signed   By: Jacqulynn Cadet M.D.   On: 02/12/2014 10:33   Portable Chest Xray  02/12/2014   CLINICAL DATA:  Obstructive sleep apnea  EXAM: PORTABLE CHEST - 1 VIEW  COMPARISON:  02/10/2014  FINDINGS: Low volumes. Endotracheal tube in place with its tip 5.8 cm from the carina. Multi focal bilateral patchy airspace opacities. Small  right pleural effusion.  IMPRESSION: Endotracheal tube 5.8 cm from the carina  CHF.   Electronically Signed   By: Marybelle Killings M.D.   On: 02/12/2014 09:31   Dg Abd Portable 1v  02/12/2014   CLINICAL DATA:  Orogastric tube placement  EXAM: PORTABLE ABDOMEN - 1 VIEW  COMPARISON:  10/28/2013  FINDINGS: An orogastric tube is coiled in the stomach. No incidental acute findings, the bowel gas pattern is nonobstructive.  IMPRESSION: Orogastric tube tip in the upper stomach.   Electronically Signed   By: Monte Fantasia M.D.   On: 02/12/2014 12:52       ASSESSMENT / PLAN:  PULMONARY OETT 2/10 >>  A: Acute Hypercarbic Respiratory Failure - in the setting of decompensated dCHF, AKI, untreated OSA OSA - suspect non-compliant with CPAP Pulonary edema, diastolic HF acute Former Smoker    - On 60% fio2, 10 peep - does not meet sbt criterial. cXR suggest acute pulm edema P:   PRVC   CARDIOVASCULAR CVL L IJ TLC 2/10 >>  A:  Hypotension - likely sedation related CHF - suspect acute decompensation, ECHO 08/2013 EF 55-60%, unable to calculate PA pressure Atrial Fibrillation - chronic, on coumadin    - ECHO 8/33/38 - Acute systolic chf. No MI  P:  Levophed for MAP > 65 ; coming down on it Start lasix despite being on pressors Hold home bisoprolol, cardizem, lasix, metolazone, lisinopril Continue ASA  Hold coumadin 2/10, transition to heparin gtt per pharmacy   RENAL A:    Acute on Chronic Kidney Disease    - better P:   Monitor with lasix   GASTROINTESTINAL A:   Morbid Obesity  Vent Associated Dysphagia  P:   NPO r TF  PPI  HEMATOLOGIC A:   Anemia - recent work up per primary MD in process, hx FOB +  P:  DVT Px: heparin gtt (previously on coumadin as outpatient) Trend CBC   INFECTIOUS A:   No acute process  P:   Monitor off abx Trend fever curve / WBC and also check PCT  ENDOCRINE A:   DM Gout P:   SSI  Hold home allopurinol   NEUROLOGIC A:   Acute Metabolic Encephalopathy  P:   RASS goal: -1 Fentanyl gtt May need int versed  FAMILY  -  Update:   02/12/14: No family available at bedside.  Attempted all numbers in chart with no response.  02/13/14 - none at bedside  GLOBAL Start lasix 02/13/14  The patient is critically ill with multiple organ systems failure and requires high complexity decision making for assessment and support, frequent evaluation and titration of therapies, application of advanced monitoring technologies and extensive interpretation of multiple databases.   Critical Care Time devoted to patient care services described in this note is  35  Minutes. This time reflects time of care of this signee Dr Brand Males. This critical care time does not reflect procedure time, or teaching time or supervisory time of PA/NP/Med student/Med Resident etc but could involve care discussion time    Dr. Brand Males, M.D., [facility].C.P Pulmonary and Critical Care Medicine Staff Physician [place] [facility] Pager: [phone], If no answer or between  15:00h - 7:00h: call [phone]  02/13/2014 11:16 AM

## 2014-02-13 NOTE — Progress Notes (Signed)
Inpatient Diabetes Program Recommendations  AACE/ADA: New Consensus Statement on Inpatient Glycemic Control (2013)  Target Ranges:  Prepandial:   less than 140 mg/dL      Peak postprandial:   less than 180 mg/dL (1-2 hours)      Critically ill patients:  140 - 180 mg/dL    Results for ANTHEM, FRAZER (MRN [id number]) as of 02/13/2014 13:54  Ref. Range 02/13/2014 03:50 02/13/2014 07:51 02/13/2014 11:34  Glucose-Capillary Latest Range: 70-99 mg/dL 166 (H) 185 (H) 196 (H)     Current orders for Inpatient glycemic control: Novolog 0-20 units Q4 hrs  Inpatient Diabetes Program Recommendations Insulin - Tube Feed Coverage: While on tube feeds, please consider placing patient on Novolog 4 units Q4hrs for tube feed coverage (do not give if tube feeds are stopped or held for any reason).  Thanks,   Tama Headings RN, MSN, [facility] Inpatient Diabetes Coordinator Team Pager [phone]

## 2014-02-13 NOTE — Progress Notes (Signed)
ANTICOAGULATION CONSULT NOTE - Follow Up Consult  Pharmacy Consult for Heparin Indication: atrial fibrillation  Patient Measurements: Height: 5' 4"  (162.6 cm) Weight: 290 lb 12.6 oz (131.9 kg) IBW/kg (Calculated) : 59.2 Heparin Dosing Weight: 91.3 kg  Vital Signs: Temp: 98.2 F (36.8 C) (02/11 1153) Temp Source: Oral (02/11 1153) BP: 126/82 mmHg (02/11 0900) Pulse Rate: 88 (02/11 0900)  Labs:  Recent Labs  02/11/14 0750 02/12/14 0545 02/12/14 0739 02/12/14 1010 02/12/14 1610  02/12/14 2155 02/13/14 0213 02/13/14 0440 02/13/14 1000  HGB 10.2* 9.9*  --   --   --   --   --   --  9.9*  --   HCT 38.3* 37.5*  --   --   --   --   --   --  35.2*  --   PLT 229 268  --   --   --   --   --   --  251  --   LABPROT 23.4* 19.6*  --   --   --   --   --   --  19.9*  --   INR 2.06* 1.64*  --   --   --   --   --   --  1.68*  --   HEPARINUNFRC  --   --   --   --   --   < >  --  <0.10* 0.33 0.32  CREATININE 1.62*  --  2.79*  --   --   --   --   --  1.59*  --   TROPONINI  --   --   --  <0.03 <0.03  --  <0.03  --   --   --   < > = values in this interval not displayed.  Estimated Creatinine Clearance: 60.2 mL/min (by C-G formula based on Cr of 1.59).  Assessment: 64 yo male with h/o Afib, Coumadin on hold, on IV heparin. Heparin level is therapeutic but trending down at low end of normal and may represent bolus effect. No bleeding per RN. CBC is low-stable. INR 1.68.   Goal of Therapy:  Heparin level 0.3-0.7 units/ml Monitor platelets by anticoagulation protocol: Yes   Plan:  Increase heparin to 2000 units/hr.  Daily heparin level and CBC.   Sloan Leiter, PharmD, BCPS Clinical Pharmacist [phone]  02/13/2014,11:57 AM

## 2014-02-14 LAB — BASIC METABOLIC PANEL
ANION GAP: 9 (ref 5–15)
ANION GAP: 5 (ref 5–15)
BUN: 48 mg/dL — ABNORMAL HIGH (ref 6–23)
BUN: 47 mg/dL — ABNORMAL HIGH (ref 6–23)
CALCIUM: 9.1 mg/dL (ref 8.4–10.5)
CHLORIDE: 104 mmol/L (ref 96–112)
CO2: 33 mmol/L — AB (ref 19–32)
CO2: 36 mmol/L — ABNORMAL HIGH (ref 19–32)
CREATININE: 1.28 mg/dL (ref 0.50–1.35)
Calcium: 9.1 mg/dL (ref 8.4–10.5)
Chloride: 103 mmol/L (ref 96–112)
Creatinine, Ser: 1.37 mg/dL — ABNORMAL HIGH (ref 0.50–1.35)
GFR calc Af Amer: 62 mL/min — ABNORMAL LOW (ref >90)
GFR calc Af Amer: 68 mL/min — ABNORMAL LOW (ref >90)
GFR calc non Af Amer: 54 mL/min — ABNORMAL LOW (ref >90)
GFR, EST NON AFRICAN AMERICAN: 58 mL/min — AB (ref >90)
GLUCOSE: 163 mg/dL — AB (ref 70–99)
Glucose, Bld: 186 mg/dL — ABNORMAL HIGH (ref 70–99)
POTASSIUM: 3.7 mmol/L (ref 3.5–5.1)
Potassium: 4.4 mmol/L (ref 3.5–5.1)
SODIUM: 146 mmol/L — AB (ref 135–145)
Sodium: 144 mmol/L (ref 135–145)

## 2014-02-14 LAB — PHOSPHORUS
PHOSPHORUS: 3.8 mg/dL (ref 2.3–4.6)
Phosphorus: 3.8 mg/dL (ref 2.3–4.6)
Phosphorus: 3.8 mg/dL (ref 2.3–4.6)
Phosphorus: 4.6 mg/dL (ref 2.3–4.6)

## 2014-02-14 LAB — LACTIC ACID, PLASMA: Lactic Acid, Venous: 0.9 mmol/L (ref 0.5–2.0)

## 2014-02-14 LAB — PROTIME-INR
INR: 1.6 — AB (ref 0.00–1.49)
Prothrombin Time: 19.2 seconds — ABNORMAL HIGH (ref 11.6–15.2)

## 2014-02-14 LAB — HEPARIN LEVEL (UNFRACTIONATED): Heparin Unfractionated: 0.35 IU/mL (ref 0.30–0.70)

## 2014-02-14 LAB — MAGNESIUM
MAGNESIUM: 2.2 mg/dL (ref 1.5–2.5)
Magnesium: 2.2 mg/dL (ref 1.5–2.5)
Magnesium: 2.3 mg/dL (ref 1.5–2.5)
Magnesium: 2.3 mg/dL (ref 1.5–2.5)

## 2014-02-14 LAB — GLUCOSE, CAPILLARY
GLUCOSE-CAPILLARY: 165 mg/dL — AB (ref 70–99)
GLUCOSE-CAPILLARY: 160 mg/dL — AB (ref 70–99)
Glucose-Capillary: 151 mg/dL — ABNORMAL HIGH (ref 70–99)
Glucose-Capillary: 170 mg/dL — ABNORMAL HIGH (ref 70–99)
Glucose-Capillary: 178 mg/dL — ABNORMAL HIGH (ref 70–99)
Glucose-Capillary: 158 mg/dL — ABNORMAL HIGH (ref 70–99)

## 2014-02-14 LAB — PROCALCITONIN: Procalcitonin: 0.15 ng/mL

## 2014-02-14 LAB — BRAIN NATRIURETIC PEPTIDE: B NATRIURETIC PEPTIDE 5: 45.5 pg/mL (ref 0.0–100.0)

## 2014-02-14 LAB — CBC
HCT: 35.3 % — ABNORMAL LOW (ref 39.0–52.0)
HEMOGLOBIN: 9.6 g/dL — AB (ref 13.0–17.0)
MCH: 22.7 pg — AB (ref 26.0–34.0)
MCHC: 27.2 g/dL — ABNORMAL LOW (ref 30.0–36.0)
MCV: 83.6 fL (ref 78.0–100.0)
PLATELETS: 237 10*3/uL (ref 150–400)
RBC: 4.22 MIL/uL (ref 4.22–5.81)
RDW: 23.1 % — ABNORMAL HIGH (ref 11.5–15.5)
WBC: 6.3 10*3/uL (ref 4.0–10.5)

## 2014-02-14 MED ORDER — POTASSIUM CHLORIDE 20 MEQ/15ML (10%) PO SOLN
40.0000 meq | Freq: Three times a day (TID) | ORAL | Status: DC
  Administered 2014-02-14 – 2014-02-15 (×4): 40 meq
  Filled 2014-02-14 (×8): qty 30

## 2014-02-14 MED ORDER — POLYETHYLENE GLYCOL 3350 17 G PO PACK
17.0000 g | PACK | Freq: Every day | ORAL | Status: DC
  Administered 2014-02-14 – 2014-02-21 (×7): 17 g via ORAL
  Filled 2014-02-14 (×9): qty 1

## 2014-02-14 MED ORDER — FUROSEMIDE 10 MG/ML IJ SOLN
60.0000 mg | Freq: Three times a day (TID) | INTRAMUSCULAR | Status: DC
  Administered 2014-02-14 (×2): 60 mg via INTRAVENOUS
  Filled 2014-02-14 (×4): qty 6

## 2014-02-14 MED ORDER — HEPARIN (PORCINE) IN NACL 100-0.45 UNIT/ML-% IJ SOLN
2050.0000 [IU]/h | INTRAMUSCULAR | Status: DC
  Administered 2014-02-14 – 2014-02-17 (×4): 2050 [IU]/h via INTRAVENOUS
  Filled 2014-02-14 (×10): qty 250

## 2014-02-14 NOTE — Progress Notes (Signed)
ANTICOAGULATION CONSULT NOTE - Follow Up Consult  Pharmacy Consult:  Heparin Indication: atrial fibrillation  Patient Measurements: Height: 5' 4"  (162.6 cm) Weight: 285 lb 4.4 oz (129.4 kg) IBW/kg (Calculated) : 59.2 Heparin Dosing Weight: 91 kg  Vital Signs: Temp: 99.7 F (37.6 C) (02/12 0849) Temp Source: Oral (02/12 0849) BP: 102/68 mmHg (02/12 0900) Pulse Rate: 111 (02/12 1000)  Labs:  Recent Labs  02/12/14 0545 02/12/14 0739 02/12/14 1010 02/12/14 1610  02/12/14 2155  02/13/14 0440 02/13/14 1000 02/14/14 0420  HGB 9.9*  --   --   --   --   --   --  9.9*  --  9.6*  HCT 37.5*  --   --   --   --   --   --  35.2*  --  35.3*  PLT 268  --   --   --   --   --   --  251  --  237  LABPROT 19.6*  --   --   --   --   --   --  19.9*  --  19.2*  INR 1.64*  --   --   --   --   --   --  1.68*  --  1.60*  HEPARINUNFRC  --   --   --   --   < >  --   < > 0.33 0.32 0.35  CREATININE  --  2.79*  --   --   --   --   --  1.59*  --  1.37*  TROPONINI  --   --  <0.03 <0.03  --  <0.03  --   --   --   --   < > = values in this interval not displayed.  Estimated Creatinine Clearance: 69 mL/min (by C-G formula based on Cr of 1.37).    Assessment: 66 YOM with Afib to continue on IV heparin while Coumadin is on hold.  Heparin level at low end of normal.  No bleeding reported.   Goal of Therapy:  Heparin level 0.3-0.7 units/ml Monitor platelets by anticoagulation protocol: Yes    Plan:  - Increase heparin gtt slightly to 2050 units/hr - Daily HL / CBC    Sherlene Rickel D. Mina Marble, PharmD, BCPS Pager:  [phone] 02/14/2014, 1:01 PM

## 2014-02-14 NOTE — Progress Notes (Signed)
OT Cancellation Note  Patient Details Name: William Flores MRN: [id number] DOB: 05/04/1950   Cancelled Treatment:    Reason Eval/Treat Not Completed: Medical issues which prohibited therapy. Pt with medical complexity requiring ventilator. Will sign off. Please reorder as appropriate.  [name] 02/14/2014, 8:04 AM

## 2014-02-14 NOTE — Consult Note (Signed)
PULMONARY / CRITICAL CARE MEDICINE   Name: LINSEY HIROTA MRN: [id number] DOB: May 29, 1950    ADMISSION DATE:  02/10/2014 CONSULTATION DATE:  02/12/14  REFERRING MD :  Dr. Candiss Norse   CHIEF COMPLAINT:  Acute Respiratory Failure / AMS   INITIAL PRESENTATION:    64 y/o M with a PMH of Afib, HLD, DM, OSA on CPAP, dCHF who presented to [facility] on 2/8 from [place] with SOB and weight gain.  On admission, the patient reported a 10 lb weight gain with swelling up to the abdomen, worsening orthopnea and non-productive cough.  He was found to be hypoxic on admission with saturations in th 70's.  Patient reported compliance with home diuretic regimen.  Work up was consistent with decompensated CHF and acute kidney injury.  He was admitted per [facility] for diuresis and close monitoring of renal function. The patient apparently had been refusing to wear his bipap during admission.  On 2/10, he decompensated with worsening of hypercarbic respiratory failure and was emergently transferred to ICU for intubation.  PCCM consulted for evaluation.  The patient was intubated and TLC was placed.     has a past medical history of Diabetes; Colon cancer; Hypertension; Sleep apnea; Atrial fibrillation; Hyperlipidemia; and CHF (congestive heart failure).   has past surgical history that includes Colon resection; Circumcision; Colonoscopy (N/A, 09/21/2012); and US echocardiography (03/04/2010).   SIGNIFICANT EVENTS: 2/08  Admit with AKI / decompensated CHF 2/10  Decompensated on floor with hypercarbic respiratory failure, to ICU for emergent intubation.  02/13/14 - on levophed, heparin gtt and fent gtt with versed prn. Creat better. On vent. ECHO with LVEF 35%, SCVO2 77% y esterday    SUBJECTIVE/OVERNIGHT/INTERVAL HX 02/14/14: still on vent. Off levophed. 3.2L negative balance. On fent gtt - agitated on wua VITAL SIGNS: Temp:  [98.2 F (36.8 C)-99.9 F (37.7 C)] 99.7 F (37.6 C) (02/12 0849) Pulse  Rate:  [59-146] 111 (02/12 1000) Resp:  [16-27] 24 (02/12 1000) BP: (92-152)/(57-82) 102/68 mmHg (02/12 0900) SpO2:  [98 %-100 %] 98 % (02/12 1000) Arterial Line BP: (90-132)/(55-114) 132/64 mmHg (02/12 1000) FiO2 (%):  [60 %] 60 % (02/12 0820) Weight:  [129.4 kg (285 lb 4.4 oz)] 129.4 kg (285 lb 4.4 oz) (02/12 0428)   HEMODYNAMICS: CVP:  [5 mmHg-16 mmHg] 16 mmHg   VENTILATOR SETTINGS: Vent Mode:  [-] PRVC FiO2 (%):  [60 %] 60 % Set Rate:  [24 bmp] 24 bmp Vt Set:  [500 mL] 500 mL PEEP:  [10 cmH20] 10 cmH20 Plateau Pressure:  [22 cmH20-24 cmH20] 24 cmH20   INTAKE / OUTPUT:  Intake/Output Summary (Last 24 hours) at 02/14/14 1105 Last data filed at 02/14/14 0900  Gross per 24 hour  Intake 1963.2 ml  Output   2870 ml  Net -906.8 ml    PHYSICAL EXAMINATION: General:  Morbidly obese male  On vent Neuro:  Intermittent agitation, moves all ext's spontaneously - on sedation gtt HEENT:  OETT, mm pink/moist, short/thick neck.  Poor dentition.  Cardiovascular:  s1s2 irr irr, no m/r/g Lungs: sync with vent Abdomen:  Obese, soft, bsx4 active  Musculoskeletal:  No acute deformities Skin:  Warm/dry, 1+ BLE pitting edema  LABS: PULMONARY  Recent Labs Lab 02/12/14 0820 02/12/14 1140 02/12/14 1304  PHART 7.071*  --  7.451*  PCO2ART CRITICAL RESULT CALLED TO, READ BACK BY AND VERIFIED WITH:  --  46.8*  PO2ART 90.3  --  204.0*  HCO3 33.8*  --  32.6*  TCO2  37.6  --  34  O2SAT 91.4 77.3 100.0    CBC  Recent Labs Lab 02/12/14 0545 02/13/14 0440 02/14/14 0420  HGB 9.9* 9.9* 9.6*  HCT 37.5* 35.2* 35.3*  WBC 8.3 9.2 6.3  PLT 268 251 237    COAGULATION  Recent Labs Lab 02/10/14 1402 02/11/14 0750 02/12/14 0545 02/13/14 0440 02/14/14 0420  INR 2.19* 2.06* 1.64* 1.68* 1.60*    CARDIAC    Recent Labs Lab 02/10/14 1401 02/12/14 1010 02/12/14 1610 02/12/14 2155  TROPONINI <0.03 <0.03 <0.03 <0.03   No results for input(s): PROBNP in the last 168  hours.   CHEMISTRY  Recent Labs Lab 02/10/14 1401 02/11/14 0750 02/12/14 0739 02/12/14 1240 02/13/14 0045 02/13/14 0440 02/13/14 1231 02/14/14 0050 02/14/14 0420  NA 140 139 140  --   --  141  --   --  146*  K 3.8 3.8 4.1  --   --  4.0  --   --  3.7  CL 98 97 101  --   --  102  --   --  104  CO2 37* 32 29  --   --  32  --   --  33*  GLUCOSE 146* 184* 219*  --   --  169*  --   --  163*  BUN 46* 48* 56*  --   --  50*  --   --  48*  CREATININE 1.58* 1.62* 2.79*  --   --  1.59*  --   --  1.37*  CALCIUM 9.1 9.2 8.0*  --   --  8.8  --   --  9.1  MG  --   --   --  2.1 2.1  --  2.2 2.2 2.2  PHOS  --   --   --  5.9* 3.0  --  2.9 3.8 3.8   Estimated Creatinine Clearance: 69 mL/min (by C-G formula based on Cr of 1.37).   LIVER  Recent Labs Lab 02/10/14 1402 02/11/14 0750 02/12/14 0545 02/13/14 0440 02/14/14 0420  INR 2.19* 2.06* 1.64* 1.68* 1.60*     INFECTIOUS  Recent Labs Lab 02/13/14 1112 02/14/14 0050 02/14/14 0420  LATICACIDVEN  --  0.9  --   PROCALCITON 0.14  --  0.15     ENDOCRINE CBG (last 3)   Recent Labs  02/13/14 2316 02/14/14 0326 02/14/14 0846  GLUCAP 165* 151* 170*         IMAGING x48h Portable Chest Xray  02/13/2014   CLINICAL DATA:  Sleep apnea.  Shortness of breath.  EXAM: PORTABLE CHEST - 1 VIEW  COMPARISON:  02/12/2014.  FINDINGS: Endotracheal tube, left IJ line, NG tube in stable position. Cardiomegaly with bilateral pulmonary alveolar infiltrates. Bilateral effusions. No pneumothorax. No acute bony abnormality.  IMPRESSION: 1. Lines and tubes in stable position. 2. Congestive heart failure with bilateral pulmonary edema increased from prior exams. Bilateral small pleural effusions.   Electronically Signed   By: Marcello Moores  Register   On: 02/13/2014 07:31   Dg Abd Portable 1v  02/12/2014   CLINICAL DATA:  Orogastric tube placement  EXAM: PORTABLE ABDOMEN - 1 VIEW  COMPARISON:  10/28/2013  FINDINGS: An orogastric tube is coiled in the  stomach. No incidental acute findings, the bowel gas pattern is nonobstructive.  IMPRESSION: Orogastric tube tip in the upper stomach.   Electronically Signed   By: Monte Fantasia M.D.   On: 02/12/2014 12:52       ASSESSMENT / PLAN:  PULMONARY OETT 2/10 >>  A: Acute Hypercarbic Respiratory Failure - in the setting of decompensated dCHF, AKI, untreated OSA OSA - suspect non-compliant with CPAP Pulonary edema, systolci and diastolic HF acute Former Smoker    - On 50% fio2, 10 peep - does not meet sbt criterial.; some better  P:   PRVC   CARDIOVASCULAR CVL L IJ TLC 2/10 >>  A:  Hypotension - likely sedation related CHF - suspect acute decompensation, ECHO 08/2013 EF 55-60%, unable to calculate PA pressure Atrial Fibrillation - chronic, on coumadin    - ECHO 02/27/80 - Acute systolic chf. No MI. Off levophed 02/14/14  P:  Lasix 8m tid  Hold home bisoprolol, cardizem, lasix, metolazone, lisinopril Continue ASA  Hold coumadin 2/10, transition to heparin gtt per pharmacy   RENAL A:   Acute on Chronic Kidney Disease    - better P:   Monitor with lasix   GASTROINTESTINAL A:   Morbid Obesity  Vent Associated Dysphagia  P:   TF  PPI  HEMATOLOGIC A:   Anemia - recent work up per primary MD in process, hx FOB +  P:  DVT Px: heparin gtt (previously on coumadin as outpatient) Trend CBC   INFECTIOUS A:   No acute process . PCT profile indeterminate.  P:   Monitor off abx Trend fever curve / WBC and also check PCT  ENDOCRINE A:   DM Gout P:   SSI  Hold home allopurinol   NEUROLOGIC A:   Acute Metabolic Encephalopathy    - agitated on WUA on fent tt P:   RASS goal: -1 Fentanyl gtt May need int versed or change to diprivan/prcedex  FAMILY  -  Update:   02/12/14: No family available at bedside.  Attempted all numbers in chart with no response.  02/13/14 and 02/14/14- none at bedside  GLOBAL Start lasix 02/13/14. Incraease lasix 02/14/14. Off  pressors now  The patient is critically ill with multiple organ systems failure and requires high complexity decision making for assessment and support, frequent evaluation and titration of therapies, application of advanced monitoring technologies and extensive interpretation of multiple databases.   Critical Care Time devoted to patient care services described in this note is  35  Minutes. This time reflects time of care of this signee Dr MBrand Males This critical care time does not reflect procedure time, or teaching time or supervisory time of PA/NP/Med student/Med Resident etc but could involve care discussion time    Dr. MBrand Males M.D., FNicklaus Children'S HospitalC.P Pulmonary and Critical Care Medicine Staff Physician CRichwoodPulmonary and Critical Care Pager: [phone] If no answer or between  15:00h - 7:00h: call [phone]  02/14/2014 11:05 AM

## 2014-02-15 ENCOUNTER — Inpatient Hospital Stay (HOSPITAL_COMMUNITY): Payer: BLUE CROSS/BLUE SHIELD

## 2014-02-15 DIAGNOSIS — I4891 Unspecified atrial fibrillation: Secondary | ICD-10-CM

## 2014-02-15 DIAGNOSIS — I509 Heart failure, unspecified: Secondary | ICD-10-CM

## 2014-02-15 LAB — BASIC METABOLIC PANEL
Anion gap: 11 (ref 5–15)
BUN: 46 mg/dL — ABNORMAL HIGH (ref 6–23)
CO2: 30 mmol/L (ref 19–32)
Calcium: 8.9 mg/dL (ref 8.4–10.5)
Chloride: 104 mmol/L (ref 96–112)
Creatinine, Ser: 1.21 mg/dL (ref 0.50–1.35)
GFR calc Af Amer: 72 mL/min — ABNORMAL LOW (ref >90)
GFR calc non Af Amer: 62 mL/min — ABNORMAL LOW (ref >90)
Glucose, Bld: 161 mg/dL — ABNORMAL HIGH (ref 70–99)
Potassium: 4 mmol/L (ref 3.5–5.1)
SODIUM: 145 mmol/L (ref 135–145)

## 2014-02-15 LAB — GLUCOSE, CAPILLARY
GLUCOSE-CAPILLARY: 179 mg/dL — AB (ref 70–99)
Glucose-Capillary: 163 mg/dL — ABNORMAL HIGH (ref 70–99)
Glucose-Capillary: 149 mg/dL — ABNORMAL HIGH (ref 70–99)
Glucose-Capillary: 160 mg/dL — ABNORMAL HIGH (ref 70–99)
Glucose-Capillary: 187 mg/dL — ABNORMAL HIGH (ref 70–99)

## 2014-02-15 LAB — PROCALCITONIN: Procalcitonin: <0.1 ng/mL

## 2014-02-15 LAB — CBC
HCT: 36.4 % — ABNORMAL LOW (ref 39.0–52.0)
Hemoglobin: 9.9 g/dL — ABNORMAL LOW (ref 13.0–17.0)
MCH: 22.8 pg — ABNORMAL LOW (ref 26.0–34.0)
MCHC: 27.2 g/dL — ABNORMAL LOW (ref 30.0–36.0)
MCV: 83.7 fL (ref 78.0–100.0)
Platelets: 237 10*3/uL (ref 150–400)
RBC: 4.35 MIL/uL (ref 4.22–5.81)
RDW: 22.9 % — ABNORMAL HIGH (ref 11.5–15.5)
WBC: 5.8 10*3/uL (ref 4.0–10.5)

## 2014-02-15 LAB — HEPARIN LEVEL (UNFRACTIONATED)
Heparin Unfractionated: 0.31 IU/mL (ref 0.30–0.70)
Heparin Unfractionated: 0.5 IU/mL (ref 0.30–0.70)

## 2014-02-15 LAB — MAGNESIUM: MAGNESIUM: 2.2 mg/dL (ref 1.5–2.5)

## 2014-02-15 LAB — BRAIN NATRIURETIC PEPTIDE: B Natriuretic Peptide: 75.9 pg/mL (ref 0.0–100.0)

## 2014-02-15 LAB — PROTIME-INR
INR: 1.36 (ref 0.00–1.49)
PROTHROMBIN TIME: 16.9 s — AB (ref 11.6–15.2)

## 2014-02-15 LAB — PHOSPHORUS: Phosphorus: 4.1 mg/dL (ref 2.3–4.6)

## 2014-02-15 MED ORDER — DILTIAZEM HCL 30 MG PO TABS
30.0000 mg | ORAL_TABLET | Freq: Once | ORAL | Status: AC
  Administered 2014-02-15: 30 mg via ORAL
  Filled 2014-02-15: qty 1

## 2014-02-15 MED ORDER — LACTULOSE 10 GM/15ML PO SOLN
30.0000 g | Freq: Once | ORAL | Status: AC
  Administered 2014-02-15: 30 g
  Filled 2014-02-15: qty 45

## 2014-02-15 MED ORDER — METOPROLOL TARTRATE 25 MG/10 ML ORAL SUSPENSION
25.0000 mg | Freq: Two times a day (BID) | ORAL | Status: DC
  Filled 2014-02-15: qty 10

## 2014-02-15 MED ORDER — DILTIAZEM 12 MG/ML ORAL SUSPENSION
90.0000 mg | Freq: Four times a day (QID) | ORAL | Status: DC
  Filled 2014-02-15 (×3): qty 9

## 2014-02-15 MED ORDER — DILTIAZEM 12 MG/ML ORAL SUSPENSION
90.0000 mg | Freq: Four times a day (QID) | ORAL | Status: DC
Start: 2014-02-15 — End: 2014-02-15
  Filled 2014-02-15 (×4): qty 9

## 2014-02-15 MED ORDER — METOPROLOL TARTRATE 1 MG/ML IV SOLN
2.5000 mg | INTRAVENOUS | Status: DC | PRN
  Administered 2014-02-15 – 2014-02-21 (×3): 5 mg via INTRAVENOUS
  Filled 2014-02-15 (×2): qty 5

## 2014-02-15 MED ORDER — DILTIAZEM 12 MG/ML ORAL SUSPENSION
60.0000 mg | Freq: Four times a day (QID) | ORAL | Status: DC
  Administered 2014-02-15: 60 mg via ORAL
  Filled 2014-02-15 (×4): qty 6

## 2014-02-15 MED ORDER — VITAL HIGH PROTEIN PO LIQD
1000.0000 mL | ORAL | Status: DC
  Administered 2014-02-15: 1000 mL
  Filled 2014-02-15 (×3): qty 1000

## 2014-02-15 MED ORDER — DILTIAZEM 12 MG/ML ORAL SUSPENSION
60.0000 mg | Freq: Four times a day (QID) | ORAL | Status: DC
  Administered 2014-02-15 – 2014-02-16 (×3): 60 mg
  Filled 2014-02-15 (×7): qty 6

## 2014-02-15 MED ORDER — MIDAZOLAM HCL 2 MG/2ML IJ SOLN
1.0000 mg | INTRAMUSCULAR | Status: DC | PRN
  Administered 2014-02-17: 1 mg via INTRAVENOUS
  Filled 2014-02-15: qty 2

## 2014-02-15 MED ORDER — FUROSEMIDE 10 MG/ML IJ SOLN
60.0000 mg | Freq: Two times a day (BID) | INTRAMUSCULAR | Status: DC
  Administered 2014-02-15 – 2014-02-18 (×7): 60 mg via INTRAVENOUS
  Filled 2014-02-15 (×6): qty 6

## 2014-02-15 MED ORDER — METOPROLOL TARTRATE 25 MG/10 ML ORAL SUSPENSION
25.0000 mg | Freq: Three times a day (TID) | ORAL | Status: DC
  Administered 2014-02-15 (×2): 25 mg via ORAL
  Filled 2014-02-15 (×5): qty 10

## 2014-02-15 NOTE — Progress Notes (Signed)
PULMONARY / CRITICAL CARE MEDICINE   Name: William Flores MRN: [id number] DOB: 24-Dec-1950    ADMISSION DATE:  02/10/2014 CONSULTATION DATE:  02/12/14  REFERRING MD :  Dr. Candiss Norse   CHIEF COMPLAINT:  Acute Respiratory Failure / AMS   INITIAL PRESENTATION:     64 y/o M with a PMH of Afib, HLD, DM, OSA on CPAP, dCHF who presented 2/8 from [place] with SOB, weight gain, non productive cough and orthopnea. Hypoxic on presentation with sats 70's.  Initially admitted by [facility] with decompensated CHF and AKI.  Attempted diuresis and bipap, but pt refused bipap.  On 2/10, he decompensated with worsening of hypercarbic respiratory failure and was emergently transferred to ICU for intubation.     SIGNIFICANT EVENTS: 2/08  Admit with AKI / decompensated CHF 2/10  Decompensated on floor with hypercarbic respiratory failure, to ICU for emergent intubation.  02/13/14 - on levophed, heparin gtt and fent gtt with versed prn. Creat better. On vent. ECHO with LVEF 35%, SCVO2 77% yesterday  STUDIES:  2D echo 2/10>>> EF 35-40%,  Diffuse hypokinesis, mild TR, normal PASP    SUBJECTIVE/OVERNIGHT/INTERVAL HX 02/14/14: still on vent. Off levophed. 3.2L negative balance. On fent gtt - agitated on wua  VITAL SIGNS: Temp:  [98.2 F (36.8 C)-99.6 F (37.6 C)] 98.2 F (36.8 C) (02/13 0742) Pulse Rate:  [68-147] 105 (02/13 0800) Resp:  [21-28] 24 (02/13 0800) BP: (92-170)/(61-93) 105/66 mmHg (02/13 0800) SpO2:  [97 %-100 %] 99 % (02/13 0800) Arterial Line BP: (80-155)/(53-97) 115/66 mmHg (02/13 0800) FiO2 (%):  [40 %-50 %] 40 % (02/13 0800) Weight:  [276 lb 10.8 oz (125.5 kg)] 276 lb 10.8 oz (125.5 kg) (02/13 0353)   HEMODYNAMICS: CVP:  [7 mmHg-50 mmHg] 49 mmHg   VENTILATOR SETTINGS: Vent Mode:  [-] PSV FiO2 (%):  [40 %-50 %] 40 % Set Rate:  [24 bmp] 24 bmp Vt Set:  [500 mL] 500 mL PEEP:  [8 cmH20-10 cmH20] 8 cmH20 Pressure Support:  [10 cmH20] 10 cmH20 Plateau Pressure:  [15 cmH20-23  cmH20] 23 cmH20   INTAKE / OUTPUT:  Intake/Output Summary (Last 24 hours) at 02/15/14 6333 Last data filed at 02/15/14 0900  Gross per 24 hour  Intake 2514.74 ml  Output   4875 ml  Net -2360.26 ml    PHYSICAL EXAMINATION: General:  Morbidly obese male  On vent Neuro:  Intermittent agitation, awake, follows commands, mouths words appropriate, MAE  HEENT:  OETT, mm pink/moist, short/thick neck.  Poor dentition.  Cardiovascular:  s1s2 irr irr, tachy, no m/r/g Lungs: resps even, non labored on PS 10/8, RR 18 Abdomen:  Obese, soft, bsx4 active  Musculoskeletal:  No acute deformities Skin:  Warm/dry, 1+ BLE pitting edema  LABS: PULMONARY  Recent Labs Lab 02/12/14 0820 02/12/14 1140 02/12/14 1304  PHART 7.071*  --  7.451*  PCO2ART CRITICAL RESULT CALLED TO, READ BACK BY AND VERIFIED WITH:  --  46.8*  PO2ART 90.3  --  204.0*  HCO3 33.8*  --  32.6*  TCO2 37.6  --  34  O2SAT 91.4 77.3 100.0    CBC  Recent Labs Lab 02/13/14 0440 02/14/14 0420 02/15/14 0350  HGB 9.9* 9.6* 9.9*  HCT 35.2* 35.3* 36.4*  WBC 9.2 6.3 5.8  PLT 251 237 237    COAGULATION  Recent Labs Lab 02/11/14 0750 02/12/14 0545 02/13/14 0440 02/14/14 0420 02/15/14 0350  INR 2.06* 1.64* 1.68* 1.60* 1.36    CARDIAC    Recent Labs Lab  02/10/14 1401 02/12/14 1010 02/12/14 1610 02/12/14 2155  TROPONINI <0.03 <0.03 <0.03 <0.03   No results for input(s): PROBNP in the last 168 hours.   CHEMISTRY  Recent Labs Lab 02/12/14 0739  02/13/14 0440  02/14/14 0050 02/14/14 0420 02/14/14 1231 02/14/14 2300 02/15/14 0350  NA 140  --  141  --   --  146*  --  144 145  K 4.1  --  4.0  --   --  3.7  --  4.4 4.0  CL 101  --  102  --   --  104  --  103 104  CO2 29  --  32  --   --  33*  --  36* 30  GLUCOSE 219*  --  169*  --   --  163*  --  186* 161*  BUN 56*  --  50*  --   --  48*  --  47* 46*  CREATININE 2.79*  --  1.59*  --   --  1.37*  --  1.28 1.21  CALCIUM 8.0*  --  8.8  --   --  9.1  --   9.1 8.9  MG  --   < >  --   < > 2.2 2.2 2.3 2.3 2.2  PHOS  --   < >  --   < > 3.8 3.8 3.8 4.6 4.1  < > = values in this interval not displayed. Estimated Creatinine Clearance: 76.7 mL/min (by C-G formula based on Cr of 1.21).   LIVER  Recent Labs Lab 02/11/14 0750 02/12/14 0545 02/13/14 0440 02/14/14 0420 02/15/14 0350  INR 2.06* 1.64* 1.68* 1.60* 1.36     INFECTIOUS  Recent Labs Lab 02/13/14 1112 02/14/14 0050 02/14/14 0420 02/15/14 0350  LATICACIDVEN  --  0.9  --   --   PROCALCITON 0.14  --  0.15 <0.10     ENDOCRINE CBG (last 3)   Recent Labs  02/14/14 1916 02/14/14 2323 02/15/14 0736  GLUCAP 160* 163* 160*     IMAGING x48h Dg Chest Port 1 View  02/15/2014   CLINICAL DATA:  Respiratory failure  EXAM: PORTABLE CHEST - 1 VIEW  COMPARISON:  Chest x-ray from 2 days prior.  FINDINGS: Endotracheal tube is in stable position, tip at the clavicular heads. Left IJ catheter, tip near the distal left brachiocephalic vein. The gastric suction tube is in good position.  Improved aeration, likely resolved pulmonary edema. There are bilateral pleural effusions, at least moderate on the right with layering. Focal right basilar opacity. No air leak.  Stable cardiomegaly and upper mediastinal widening.  IMPRESSION: 1. Resolved pulmonary edema. Right more than left layering pleural effusions persist. 2. Focal opacity at the right base which could reflect pneumonia or atelectasis. 3. Stable positioning of tubes and central line.   Electronically Signed   By: Monte Fantasia M.D.   On: 02/15/2014 08:14     ASSESSMENT / PLAN:  PULMONARY OETT 2/10 >>  A: Acute Hypercarbic Respiratory Failure - in the setting of decompensated dCHF, AKI, untreated OSA OSA - suspect non-compliant with CPAP Pulmonary edema Pleural effusions  Former Smoker  P:   Cont vent support  F/u CXR  F/u ABG  Daily SBT - likely nearing extubation  Cont diuresis  Will need qhs bipap post extubation     CARDIOVASCULAR CVL L IJ TLC 2/10 >>  A:  Acute on chronic systolic CHF Hypotension - likely sedation related Atrial Fibrillation - chronic, on  coumadin - RVR 2/13 P:  Change lasix to 13m q12 (was TID) Resume home cardizem 2/13 Cont hold home bisoprolol, cardizem, metolazone, lisinopril Continue ASA  Cont heparin gtt per pharmacy, hold home coumadin    RENAL A:   Acute on Chronic Kidney Disease - improved.   P:   F/u chem    GASTROINTESTINAL A:   Morbid Obesity  Vent Associated Dysphagia  P:   TF  PPI   HEMATOLOGIC A:   Anemia - recent work up per primary MD in process, hx FOB +  P:  DVT Px: heparin gtt (previously on coumadin as outpatient) Trend CBC    INFECTIOUS A:   No active infection - pct neg  P:   Monitor off abx Trend fever curve / WBC    ENDOCRINE A:   DM Gout P:   SSI  Hold home allopurinol    NEUROLOGIC A:   Acute Metabolic Encephalopathy  P:   RASS goal: -1 Fentanyl gtt May need int versed or change to diprivan/prcedex  FAMILY  -  Update:  No family available 2/13   GLOBAL Cont lasix, resume home cardizem with RVR, cont attempts at weaning.    KNickolas Madrid NP 02/15/2014  9:23 AM Pager: [phone] ATTENDING NOTE: I have personally reviewed patient's available data, including medical history, events of note, physical examination and test results as part of my evaluation. I have discussed with resident/NP and other careteam providers such as pharmacist, RN and RRT & co-ordinated with consultants. In addition, I personally evaluated patient and elicited key history of acute hypercarbic resp failure, OSA/OHS, exam findings of weaning well, tachy -AF-RVRon monitor, 1+ edema & labs showing  cr 1.2, lwo pct, no leucocytosis.  Recomm - increase cardizem 90 q 6h & lopressor prn for rate control Diurese Ct he[parin  Hope to extubate in 24h  Rest per NP/medical resident whose note is outlined above and  that I agree with and edited in full.  Updated ex wife The patient is critically ill with multiple organ systems failure and requires high complexity decision making for assessment and support, frequent evaluation and titration of therapies, application of advanced monitoring technologies and extensive interpretation of multiple databases. Critical Care Time devoted to patient care services described in this note independent of APP time is 35 minutes.    ARigoberto NoelMD

## 2014-02-15 NOTE — Consult Note (Addendum)
CONSULT NOTE  Date: 02/15/2014               Patient Name:  William Flores MRN: [id number]  DOB: 06-04-1950 Age / Sex: 64 y.o., male        PCP: [facility] Primary Cardiologist: [name]             Referring Physician:  Chase Caller              Reason for Consult: New systolic CHF, hx of OSA, diastolic CHF,            History of Present Illness: Patient is a 64 y.o. male with a PMHx of atrial fibrillation, hyperlipidemia, diabetes mellitus, morbid obesity with obesity hypoventilation syndrome and obstructive sleep apnea,, who was admitted to [facility] on 02/10/2014 for evaluation of worsening heart failure symptoms.Marland Kitchen   He was transferred to [facility] on February 11 from [place]   family practice.  He's had progressive weight gain. He's worsening orthopnea and leg edema.  He's been seen in our [facility]. Marland Kitchen He is on chronic Lasix therapy. He has also been on metolazone every other day.    His admission weight was 294 pounds. His current weight is 276 pounds. This is very close to his baseline.  His BNP this AM was normal at 76.   The patient is intubated. I was not able to ask him any history. He is able to nod  and shake his head. his head  He's not currently having any pain. He wants to get off the ventilator.  Medications: Outpatient medications: Prescriptions prior to admission  Medication Sig Dispense Refill Last Dose  . allopurinol (ZYLOPRIM) 100 MG tablet TAKE 1 TABLET (100 MG TOTAL) BY MOUTH DAILY. 30 tablet 3 02/10/2014 at Unknown time  . allopurinol (ZYLOPRIM) 300 MG tablet TAKE 1 TABLET (300 MG TOTAL) BY MOUTH DAILY. ALSO TAKE 100MG DAILY TO EQUAL 400MG TOTAL. 30 tablet 0 02/10/2014 at Unknown time  . aspirin 81 MG tablet Take 81 mg by mouth daily.   02/10/2014 at Unknown time  . azelastine (OPTIVAR) 0.05 % ophthalmic solution Place 2 drops into both eyes 2 (two) times daily. 6 mL 12 02/10/2014 at Unknown time  . benzonatate (TESSALON) 100 MG capsule  Take 1 capsule (100 mg total) by mouth 3 (three) times daily as needed for cough. 20 capsule 0 02/10/2014 at Unknown time  . bisoprolol (ZEBETA) 5 MG tablet TAKE 0.5 TABLETS (2.5 MG TOTAL) BY MOUTH DAILY. 30 tablet 1 02/10/2014 at 0900  . diltiazem (CARDIZEM CD) 360 MG 24 hr capsule TAKE 1 CAPSULE (360 MG TOTAL) BY MOUTH DAILY. 30 capsule 5 02/10/2014 at Unknown time  . ferrous sulfate 325 (65 FE) MG tablet Take 1 tablet (325 mg total) by mouth daily with breakfast.  3 02/10/2014 at Unknown time  . furosemide (LASIX) 40 MG tablet Take 80 mg by mouth 2 (two) times daily. Take 25m q AM, 40 mg hs  3 02/10/2014 at Unknown time  . glucose blood test strip Use to check BG daily 100 each 2 unknown  . lisinopril (PRINIVIL,ZESTRIL) 20 MG tablet TAKE 1 TABLET (20 MG TOTAL) BY MOUTH DAILY. 30 tablet 4 02/10/2014 at Unknown time  . metolazone (ZAROXOLYN) 2.5 MG tablet Take 1 tablet (2.5 mg total) by mouth daily. (Patient taking differently: Take 2.5 mg by mouth daily. Take Monday,Wednesday and Friday) 30 tablet 1 02/10/2014 at Unknown time  . omeprazole (PRILOSEC) 20 MG  capsule TAKE 1 CAPSULE (20 MG TOTAL) BY MOUTH DAILY. 30 capsule 4 02/10/2014 at Unknown time  . ONETOUCH DELICA LANCETS 00F MISC 1 each by Does not apply route daily. Use one lancet to check BG daily 100 each 2 unknown  . sitaGLIPtin-metformin (JANUMET) 50-1000 MG per tablet Take 1 tablet by mouth 2 (two) times daily with a meal. 60 tablet 3 02/10/2014 at Unknown time  . warfarin (COUMADIN) 5 MG tablet TAKE 1 TO 2 TABLETS DAILY AS DIRECTED BY ANTICOAGULATION CLINIC (Patient taking differently: Take 7.68m daily exc 173mon Mon / Fri) 60 tablet 2 02/10/2014 at Unknown time    Current medications: Current Facility-Administered Medications  Medication Dose Route Frequency Provider Last Rate Last Dose  . 0.9 %  sodium chloride infusion   Intravenous Continuous AnLeota JacobsenMD 10 mL/hr at 02/14/14 2000    . antiseptic oral rinse (CPC / CETYLPYRIDINIUM CHLORIDE  0.05%) solution 7 mL  7 mL Mouth Rinse QID DaRaylene MiyamotoMD   7 mL at 02/15/14 1200  . aspirin chewable tablet 81 mg  81 mg Oral Daily ShJonetta OsgoodMD   81 mg at 02/15/14 1010  . chlorhexidine (PERIDEX) 0.12 % solution 15 mL  15 mL Mouth Rinse BID DaRaylene MiyamotoMD   15 mL at 02/15/14 0825  . diltiazem (CARDIZEM) 10 mg/ml oral suspension 90 mg  90 mg Per Tube 4 times per day JeBrain HiltsRPHardy Wilson Memorial Hospital    . docusate (COLACE) 50 MG/5ML liquid 100 mg  100 mg Per Tube BID BrDonita BrooksNP   100 mg at 02/15/14 1010  . etomidate (AMIDATE) injection 20 mg  20 mg Intravenous Once DaRaylene MiyamotoMD      . feeding supplement (PRO-STAT SUGAR FREE 64) liquid 30 mL  30 mL Per Tube TID KiDalene CarrowRD   30 mL at 02/15/14 1011  . feeding supplement (VITAL HIGH PROTEIN) liquid 1,000 mL  1,000 mL Per Tube Q24H RaKara Mead, MD      . fentaNYL (SUBLIMAZE) 2,500 mcg in sodium chloride 0.9 % 250 mL (10 mcg/mL) infusion  25-400 mcg/hr Intravenous Continuous DaRaylene MiyamotoMD   Stopped at 02/15/14 [phone]. fentaNYL (SUBLIMAZE) bolus via infusion 50 mcg  50 mcg Intravenous Q1H PRN DaRaylene MiyamotoMD      . furosemide (LASIX) injection 60 mg  60 mg Intravenous Q12H KaMarijean HeathNP   60 mg at 02/15/14 1048  . heparin ADULT infusion 100 units/mL (25000 units/250 mL)  2,050 Units/hr Intravenous Continuous ThSaundra ShellingRPH 20.5 mL/hr at 02/14/14 2038 2,050 Units/hr at 02/14/14 2038  . insulin aspart (novoLOG) injection 0-20 Units  0-20 Units Subcutaneous 6 times per day BrDonita BrooksNP   4 Units at 02/15/14 1159  . ketotifen (ZADITOR) 0.025 % ophthalmic solution 1 drop  1 drop Both Eyes BID AlSamella ParrNP   1 drop at 02/15/14 1047  . metoprolol (LOPRESSOR) injection 2.5-5 mg  2.5-5 mg Intravenous Q3H PRN RaRigoberto NoelMD   5 mg at 02/15/14 1219  . midazolam (VERSED) injection 2 mg  2 mg Intravenous Q4H PRN DaRaylene MiyamotoMD   2 mg at 02/15/14 0915  .  pantoprazole sodium (PROTONIX) 40 mg/20 mL oral suspension 40 mg  40 mg Per Tube Daily BrDonita BrooksNP   40 mg at 02/15/14 1010  . polyethylene glycol (MIRALAX / GLYCOLAX) packet 17  g  17 g Oral Daily Brand Males, MD   17 g at 02/15/14 1010  . potassium chloride 20 MEQ/15ML (10%) solution 40 mEq  40 mEq Per Tube TID Elsie Stain, MD   40 mEq at 02/15/14 1010     Allergies  Allergen Reactions  . Codeine     Headache      Past Medical History  Diagnosis Date  . Diabetes   . Colon cancer   . Hypertension   . Sleep apnea   . Atrial fibrillation   . Hyperlipidemia   . CHF (congestive heart failure)     Past Surgical History  Procedure Laterality Date  . Colon resection    . Circumcision    . Colonoscopy N/A 09/21/2012    Procedure: COLONOSCOPY;  Surgeon: Inda Castle, MD;  Location: [facility];  Service: Endoscopy;  Laterality: N/A;  . US echocardiography  03/04/2010    abnormal study    Family History  Problem Relation Age of Onset  . Breast cancer Mother   . Diabetes Mother     Social History:  reports that he quit smoking about 30 years ago. He has never used smokeless tobacco. He reports that he does not drink alcohol or use illicit drugs.   Review of Systems:  Unable to review his systems due to intubation    Physical Exam: BP 117/76 mmHg  Pulse 104  Temp(Src) 98 F (36.7 C) (Oral)  Resp 16  Ht 5' 4"  (1.626 m)  Wt 276 lb 10.8 oz (125.5 kg)  BMI 47.47 kg/m2  SpO2 98%  Wt Readings from Last 3 Encounters:  02/15/14 276 lb 10.8 oz (125.5 kg)  02/10/14 294 lb 12.8 oz (133.72 kg)  12/19/13 284 lb (128.822 kg)    General: Vital signs reviewed and noted. Morbidly obese gentleman. He is currently intubated.   Head: Normocephalic, atraumatic, sclera anicteric, currently intubated   Neck: Supple. Negative for carotid bruits. No JVD   Lungs:  On the vent.  Lungs are clear anteriorly  Heart: Irreg. Irreg.   Abdomen/ GI :  Morbidly obese. , nontender  , no masses.   MSK: Strength and the appear normal for age.   Extremities: No clubbing or cyanosis. No edema.  Distal pedal pulses are 2+ and equal ,  He has chronic stasis changes   Neurologic:  CN are grossly intact,  No obvious motor or sensory defect.  Alert and oriented X 3. Moves all extremities spontaneously.  Psych: Responds to questions with nods and shaking of his head.  Seems to understand his circumstances.     Lab results: Basic Metabolic Panel:  Recent Labs Lab 02/14/14 0050 02/14/14 0420 02/14/14 1231 02/14/14 2300 02/15/14 0350  NA  --  146*  --  144 145  K  --  3.7  --  4.4 4.0  CL  --  104  --  103 104  CO2  --  33*  --  36* 30  GLUCOSE  --  163*  --  186* 161*  BUN  --  48*  --  47* 46*  CREATININE  --  1.37*  --  1.28 1.21  CALCIUM  --  9.1  --  9.1 8.9  MG 2.2 2.2 2.3 2.3 2.2  PHOS 3.8 3.8 3.8 4.6 4.1    Liver Function Tests: No results for input(s): AST, ALT, ALKPHOS, BILITOT, PROT, ALBUMIN in the last 168 hours. No results for input(s): LIPASE, AMYLASE in the last 168  hours. No results for input(s): AMMONIA in the last 168 hours.  CBC:  Recent Labs Lab 02/10/14 1401 02/11/14 0750 02/12/14 0545 02/13/14 0440 02/14/14 0420 02/15/14 0350  WBC 6.3 6.8 8.3 9.2 6.3 5.8  NEUTROABS 4.9  --   --   --   --   --   HGB 10.3* 10.2* 9.9* 9.9* 9.6* 9.9*  HCT 36.8* 38.3* 37.5* 35.2* 35.3* 36.4*  MCV 81.2 84.9 84.5 81.3 83.6 83.7  PLT 237 229 268 251 237 237    Cardiac Enzymes:  Recent Labs Lab 02/10/14 1401 02/12/14 1010 02/12/14 1610 02/12/14 2155  TROPONINI <0.03 <0.03 <0.03 <0.03    BNP: Invalid input(s): POCBNP  CBG:  Recent Labs Lab 02/14/14 1916 02/14/14 2323 02/15/14 0347 02/15/14 0736 02/15/14 1120  GLUCAP 160* 163* 149* 160* 187*    Coagulation Studies:  Recent Labs  02/13/14 0440 02/14/14 0420 02/15/14 0350  LABPROT 19.9* 19.2* 16.9*  INR 1.68* 1.60* 1.36     Other results: Personal review of EKG shows :   Atrial fib with RVR,  Nonspecific ST changes.   Imaging: Dg Chest Port 1 View  02/15/2014   CLINICAL DATA:  Respiratory failure  EXAM: PORTABLE CHEST - 1 VIEW  COMPARISON:  Chest x-ray from 2 days prior.  FINDINGS: Endotracheal tube is in stable position, tip at the clavicular heads. Left IJ catheter, tip near the distal left brachiocephalic vein. The gastric suction tube is in good position.  Improved aeration, likely resolved pulmonary edema. There are bilateral pleural effusions, at least moderate on the right with layering. Focal right basilar opacity. No air leak.  Stable cardiomegaly and upper mediastinal widening.  IMPRESSION: 1. Resolved pulmonary edema. Right more than left layering pleural effusions persist. 2. Focal opacity at the right base which could reflect pneumonia or atelectasis. 3. Stable positioning of tubes and central line.   Electronically Signed   By: Monte Fantasia M.D.   On: 02/15/2014 08:14     Last Echo  02/12/14:  - Left ventricle: The cavity size was normal. There was moderate concentric hypertrophy. Systolic function was moderately reduced. The estimated ejection fraction was in the range of 35% to 40%. Diffuse hypokinesis. - Left atrium: The atrium was mildly dilated. - Right ventricle: Systolic function was mildly reduced. - Right atrium: The atrium was normal in size. - Tricuspid valve: Structurally normal valve. There was mild regurgitation. - Pulmonary arteries: Systolic pressure was within the normal range. - Inferior vena cava: The vessel was dilated. The respirophasic diameter changes were blunted (< 50%), consistent with elevated central venous pressure. - Pericardium, extracardiac: There was no pericardial effusion  Assessment & Plan:  1. Acute systolic congestive heart failure: The patient presents with acute shortness of breath. He has known chronic diastolic congestive heart failure and now presents with acute systolic congestive  heart failure as well. The exact etiology is not clear.  His troponin levels have been negative so far. We will continue to draw additional troponin levels. His B natruretic peptide levels have all been normal.  He's had chronic atrial fibrillation for years and it's possible that he's ever had rapid ventricular response for prolonged period of time prior to his admission. This could certainly contribute to reduced left ventricular ejection fraction.  His heart rate at present is not all that fast. . Given his recent diagnosis of reduced LV function, I would try to minimize are diltiazem while at the same time increasing his beta blocker. Will start metoprolol 25  mg per NG TID and cut his diltiazem to 60 mg per NG Q6 .   We will try to taper the diltiazem until we can DC it and titrate up the metoprolol as tolerated.  His BNP levels remain normal .    I do not think that he is volume overloaded at this time.  He does not have any edema.  His weight is at his baseline.   2.  Obesity hypoventilation: Plans per PCCM.   3. Chronic diastolic chf:  His volume status seems to be euvolemic at this time.   4. Atrial fib:  He is on heparin.  This is a chronic problem for him.  Its not clear whether he's tachycardic because of his respiratory failure or developed respiratory failure as result of being tachycardic. We'll continue for rate control primarily using beta blockers instead of diltiazem.  5. Essential HTN:  BP is ok  6. Morbid obesity:     Ramond Dial., MD, [facility] 02/15/2014, 4:00 PM Office - [phone] Pager [phone]

## 2014-02-15 NOTE — Progress Notes (Signed)
ANTICOAGULATION CONSULT NOTE - Follow Up Consult  Pharmacy Consult:  Heparin Indication: atrial fibrillation  Patient Measurements: Height: 5' 4"  (162.6 cm) Weight: 276 lb 10.8 oz (125.5 kg) IBW/kg (Calculated) : 59.2 Heparin Dosing Weight: 91 kg  Vital Signs: Temp: 98.2 F (36.8 C) (02/13 0742) Temp Source: Oral (02/13 0742) BP: 105/66 mmHg (02/13 0800) Pulse Rate: 105 (02/13 0800)  Labs:  Recent Labs  02/12/14 1010 02/12/14 1610  02/12/14 2155  02/13/14 0440 02/13/14 1000 02/14/14 0420 02/14/14 2300 02/15/14 0350  HGB  --   --   --   --   < > 9.9*  --  9.6*  --  9.9*  HCT  --   --   --   --   --  35.2*  --  35.3*  --  36.4*  PLT  --   --   --   --   --  251  --  237  --  237  LABPROT  --   --   --   --   --  19.9*  --  19.2*  --  16.9*  INR  --   --   --   --   --  1.68*  --  1.60*  --  1.36  HEPARINUNFRC  --   --   < >  --   < > 0.33 0.32 0.35  --  0.31  CREATININE  --   --   --   --   < > 1.59*  --  1.37* 1.28 1.21  TROPONINI <0.03 <0.03  --  <0.03  --   --   --   --   --   --   < > = values in this interval not displayed.  Estimated Creatinine Clearance: 76.7 mL/min (by C-G formula based on Cr of 1.21).    Assessment: 65 YOM with Afib to continue on IV heparin while Coumadin is on hold.  Heparin level at low end of normal.  Pink tinged urine per RN this AM and received note of this in report. No other overt bleeding.   Goal of Therapy:  Heparin level 0.3-0.7 units/ml Monitor platelets by anticoagulation protocol: Yes    Plan:  - Continue heparin gtt at current rate of 2050 units/hr - 6 hr heparin level and follow-up on signs or symptoms of bleeding - Daily HL / CBC  Sloan Leiter, PharmD, BCPS Clinical Pharmacist [phone] 02/15/2014, 9:08 AM

## 2014-02-15 NOTE — Progress Notes (Signed)
ANTICOAGULATION CONSULT NOTE - Follow Up Consult  Pharmacy Consult:  Heparin Indication: atrial fibrillation  Patient Measurements: Height: 5' 4"  (162.6 cm) Weight: 276 lb 10.8 oz (125.5 kg) IBW/kg (Calculated) : 59.2 Heparin Dosing Weight: 91 kg  Vital Signs: Temp: 98.5 F (36.9 C) (02/13 1627) Temp Source: Oral (02/13 1627) BP: 141/78 mmHg (02/13 1800) Pulse Rate: 121 (02/13 1800)  Labs:  Recent Labs  02/12/14 2155  02/13/14 0440  02/14/14 0420 02/14/14 2300 02/15/14 0350 02/15/14 1745  HGB  --   < > 9.9*  --  9.6*  --  9.9*  --   HCT  --   --  35.2*  --  35.3*  --  36.4*  --   PLT  --   --  251  --  237  --  237  --   LABPROT  --   --  19.9*  --  19.2*  --  16.9*  --   INR  --   --  1.68*  --  1.60*  --  1.36  --   HEPARINUNFRC  --   < > 0.33  < > 0.35  --  0.31 0.50  CREATININE  --   < > 1.59*  --  1.37* 1.28 1.21  --   TROPONINI <0.03  --   --   --   --   --   --   --   < > = values in this interval not displayed.  Estimated Creatinine Clearance: 76.7 mL/min (by C-G formula based on Cr of 1.21).    Assessment: 67 YOM with Afib to continue on IV heparin while Coumadin is on hold.  Heparin level at low end of normal.  Pink tinged urine per RN this AM and received note of this in report. No other overt bleeding.  PM HL = 0.50  Goal of Therapy:  Heparin level 0.3-0.7 units/ml Monitor platelets by anticoagulation protocol: Yes    Plan:  - Continue heparin gtt at current rate of 2050 units/hr - Daily HL / CBC  Thank you. Anette Guarneri, PharmD [phone]  02/15/2014, 6:19 PM

## 2014-02-15 NOTE — Progress Notes (Signed)
UR Completed.  [phone]  

## 2014-02-16 ENCOUNTER — Inpatient Hospital Stay (HOSPITAL_COMMUNITY): Payer: BLUE CROSS/BLUE SHIELD

## 2014-02-16 DIAGNOSIS — I482 Chronic atrial fibrillation: Secondary | ICD-10-CM

## 2014-02-16 LAB — BASIC METABOLIC PANEL
Anion gap: 5 (ref 5–15)
BUN: 37 mg/dL — ABNORMAL HIGH (ref 6–23)
CALCIUM: 9.7 mg/dL (ref 8.4–10.5)
CO2: 35 mmol/L — AB (ref 19–32)
Chloride: 108 mmol/L (ref 96–112)
Creatinine, Ser: 1.15 mg/dL (ref 0.50–1.35)
GFR calc Af Amer: 77 mL/min — ABNORMAL LOW (ref >90)
GFR calc non Af Amer: 66 mL/min — ABNORMAL LOW (ref >90)
GLUCOSE: 187 mg/dL — AB (ref 70–99)
Potassium: 4.4 mmol/L (ref 3.5–5.1)
Sodium: 148 mmol/L — ABNORMAL HIGH (ref 135–145)

## 2014-02-16 LAB — GLUCOSE, CAPILLARY
GLUCOSE-CAPILLARY: 154 mg/dL — AB (ref 70–99)
GLUCOSE-CAPILLARY: 178 mg/dL — AB (ref 70–99)
Glucose-Capillary: 162 mg/dL — ABNORMAL HIGH (ref 70–99)
Glucose-Capillary: 201 mg/dL — ABNORMAL HIGH (ref 70–99)
Glucose-Capillary: 142 mg/dL — ABNORMAL HIGH (ref 70–99)
Glucose-Capillary: 165 mg/dL — ABNORMAL HIGH (ref 70–99)
Glucose-Capillary: 187 mg/dL — ABNORMAL HIGH (ref 70–99)
Glucose-Capillary: 222 mg/dL — ABNORMAL HIGH (ref 70–99)

## 2014-02-16 LAB — CBC
HCT: 40.5 % (ref 39.0–52.0)
HEMOGLOBIN: 10.9 g/dL — AB (ref 13.0–17.0)
MCH: 22.7 pg — ABNORMAL LOW (ref 26.0–34.0)
MCHC: 26.9 g/dL — ABNORMAL LOW (ref 30.0–36.0)
MCV: 84.4 fL (ref 78.0–100.0)
PLATELETS: 229 10*3/uL (ref 150–400)
RBC: 4.8 MIL/uL (ref 4.22–5.81)
RDW: 22.8 % — ABNORMAL HIGH (ref 11.5–15.5)
WBC: 8.2 10*3/uL (ref 4.0–10.5)

## 2014-02-16 LAB — PHOSPHORUS: PHOSPHORUS: 3.6 mg/dL (ref 2.3–4.6)

## 2014-02-16 LAB — HEPARIN LEVEL (UNFRACTIONATED): HEPARIN UNFRACTIONATED: 0.51 [IU]/mL (ref 0.30–0.70)

## 2014-02-16 LAB — MAGNESIUM: Magnesium: 2.2 mg/dL (ref 1.5–2.5)

## 2014-02-16 LAB — PROTIME-INR
INR: 1.3 (ref 0.00–1.49)
PROTHROMBIN TIME: 16.4 s — AB (ref 11.6–15.2)

## 2014-02-16 MED ORDER — LEVALBUTEROL HCL 0.63 MG/3ML IN NEBU
0.6300 mg | INHALATION_SOLUTION | Freq: Four times a day (QID) | RESPIRATORY_TRACT | Status: DC
  Administered 2014-02-16 – 2014-02-18 (×10): 0.63 mg via RESPIRATORY_TRACT
  Filled 2014-02-16 (×18): qty 3

## 2014-02-16 MED ORDER — ALBUTEROL SULFATE (2.5 MG/3ML) 0.083% IN NEBU
2.5000 mg | INHALATION_SOLUTION | RESPIRATORY_TRACT | Status: DC | PRN
  Administered 2014-02-18: 2.5 mg via RESPIRATORY_TRACT
  Filled 2014-02-16: qty 3

## 2014-02-16 MED ORDER — DILTIAZEM HCL 60 MG PO TABS
60.0000 mg | ORAL_TABLET | Freq: Four times a day (QID) | ORAL | Status: DC
  Administered 2014-02-16 – 2014-02-18 (×9): 60 mg via ORAL
  Filled 2014-02-16 (×12): qty 1

## 2014-02-16 MED ORDER — METOPROLOL TARTRATE 50 MG PO TABS
50.0000 mg | ORAL_TABLET | Freq: Two times a day (BID) | ORAL | Status: DC
  Administered 2014-02-16 (×2): 50 mg via ORAL
  Filled 2014-02-16 (×4): qty 1

## 2014-02-16 MED ORDER — PANTOPRAZOLE SODIUM 40 MG PO TBEC
40.0000 mg | DELAYED_RELEASE_TABLET | Freq: Every day | ORAL | Status: DC
  Administered 2014-02-16 – 2014-02-18 (×3): 40 mg via ORAL
  Filled 2014-02-16 (×3): qty 1

## 2014-02-16 MED ORDER — LEVALBUTEROL HCL 0.63 MG/3ML IN NEBU: 0.6300 mg | INHALATION_SOLUTION | Freq: Four times a day (QID) | RESPIRATORY_TRACT | Status: DC

## 2014-02-16 MED ORDER — RACEPINEPHRINE HCL 2.25 % IN NEBU
INHALATION_SOLUTION | RESPIRATORY_TRACT | Status: AC
  Administered 2014-02-16: 0.5 mL
  Filled 2014-02-16: qty 0.5

## 2014-02-16 MED ORDER — POTASSIUM CHLORIDE 20 MEQ/15ML (10%) PO SOLN: 40.0000 meq | Freq: Three times a day (TID) | ORAL | Status: DC

## 2014-02-16 MED ORDER — POTASSIUM CHLORIDE CRYS ER 20 MEQ PO TBCR
40.0000 meq | EXTENDED_RELEASE_TABLET | Freq: Three times a day (TID) | ORAL | Status: DC
  Administered 2014-02-16 – 2014-02-18 (×8): 40 meq via ORAL
  Filled 2014-02-16 (×9): qty 2

## 2014-02-16 MED ORDER — LEVALBUTEROL HCL 0.63 MG/3ML IN NEBU
INHALATION_SOLUTION | RESPIRATORY_TRACT | Status: AC
  Administered 2014-02-16: 0.63 mg via RESPIRATORY_TRACT
  Filled 2014-02-16: qty 3

## 2014-02-16 MED ORDER — SUCRALFATE 1 GM/10ML PO SUSP
1.0000 g | Freq: Once | ORAL | Status: AC
  Administered 2014-02-16: 1 g via ORAL
  Filled 2014-02-16: qty 10

## 2014-02-16 MED ORDER — METHYLPREDNISOLONE SODIUM SUCC 125 MG IJ SOLR
125.0000 mg | Freq: Once | INTRAMUSCULAR | Status: AC
  Administered 2014-02-16: 125 mg via INTRAVENOUS
  Filled 2014-02-16: qty 2

## 2014-02-16 MED ORDER — DOCUSATE SODIUM 100 MG PO CAPS
100.0000 mg | ORAL_CAPSULE | Freq: Two times a day (BID) | ORAL | Status: DC
  Administered 2014-02-16 – 2014-02-18 (×4): 100 mg via ORAL
  Filled 2014-02-16 (×5): qty 1

## 2014-02-16 NOTE — Progress Notes (Signed)
ANTICOAGULATION CONSULT NOTE - Follow Up Consult  Pharmacy Consult:  Heparin Indication: atrial fibrillation  Patient Measurements: Height: 5' 4"  (162.6 cm) Weight: 274 lb 4 oz (124.4 kg) IBW/kg (Calculated) : 59.2 Heparin Dosing Weight: 91 kg  Vital Signs: Temp: 98.6 F (37 C) (02/14 0740) Temp Source: Oral (02/14 0740) BP: 139/81 mmHg (02/14 1100) Pulse Rate: 128 (02/14 1100)  Labs:  Recent Labs  02/14/14 0420 02/14/14 2300 02/15/14 0350 02/15/14 1745 02/16/14 0400 02/16/14 0915  HGB 9.6*  --  9.9*  --  10.9*  --   HCT 35.3*  --  36.4*  --  40.5  --   PLT 237  --  237  --  229  --   LABPROT 19.2*  --  16.9*  --  16.4*  --   INR 1.60*  --  1.36  --  1.30  --   HEPARINUNFRC 0.35  --  0.31 0.50  --  0.51  CREATININE 1.37* 1.28 1.21  --  1.15  --     Estimated Creatinine Clearance: 80.4 mL/min (by C-G formula based on Cr of 1.15).    Assessment: 22 YOM with Afib to continue on IV heparin while Coumadin is on hold.  Heparin level is therapeutic on current rate. RN reports patient with tea-colored urine this AM more consistent with old blood and 1 moist blood clot from mouth which she feels is more contributory to poor oral hygiene of patient and cleaning of them. No overt bleeding noted. Patient was extubated this AM.    Goal of Therapy:  Heparin level 0.3-0.7 units/ml Monitor platelets by anticoagulation protocol: Yes    Plan:  Continue heparin gtt at current rate of 2050 units/hr Daily HL / CBC Follow-up when to restart oral anticoagulation.   Sloan Leiter, PharmD, BCPS Clinical Pharmacist [phone] 02/16/2014, 11:13 AM

## 2014-02-16 NOTE — Progress Notes (Signed)
Approximately 160 ML of Fentanyl gtt wasted in sink with Marcheta Grammes, RN.  Olevia Perches, RN

## 2014-02-16 NOTE — Progress Notes (Signed)
PULMONARY / CRITICAL CARE MEDICINE   Name: William Flores MRN: [id number] DOB: [birth year]    ADMISSION DATE:  02/10/2014 CONSULTATION DATE:  02/12/14  REFERRING MD :  Dr. Candiss Norse   CHIEF COMPLAINT:  Acute Respiratory Failure / AMS   INITIAL PRESENTATION:     64 y/o M with a PMH of Afib, HLD, DM, OSA on CPAP, dCHF who presented 2/8 from [place] with SOB, weight gain, non productive cough and orthopnea. Hypoxic on presentation with sats 70's.  Initially admitted by [facility] with decompensated CHF and AKI.  Attempted diuresis and bipap, but pt refused bipap.  On 2/10, he decompensated with worsening of hypercarbic respiratory failure and was emergently transferred to ICU for intubation.     SIGNIFICANT EVENTS: 2/08  Admit with AKI / decompensated CHF 2/10  Decompensated on floor with hypercarbic respiratory failure, to ICU for emergent intubation.  02/13/14 - on levophed, heparin gtt and fent gtt with versed prn. Creat better. On vent. ECHO with LVEF 35%, SCVO2 77%  2/13 cards consult  STUDIES:  2D echo 2/10>>> EF 35-40%,  Diffuse hypokinesis, mild TR, normal PASP    SUBJECTIVE/OVERNIGHT/INTERVAL HX oob to chair Weaning well on ps 5/5 Denies abd pain Had BM  VITAL SIGNS: Temp:  [98 F (36.7 C)-99.8 F (37.7 C)] 98.6 F (37 C) (02/14 0740) Pulse Rate:  [29-144] 113 (02/14 0833) Resp:  [13-25] 13 (02/14 0833) BP: (101-166)/(66-104) 101/66 mmHg (02/14 0833) SpO2:  [93 %-100 %] 99 % (02/14 0833) Arterial Line BP: (96-172)/(67-116) 106/103 mmHg (02/14 0000) FiO2 (%):  [40 %] 40 % (02/14 0833) Weight:  [124.4 kg (274 lb 4 oz)] 124.4 kg (274 lb 4 oz) (02/14 0418)   HEMODYNAMICS: CVP:  [26 mmHg-29 mmHg] 29 mmHg   VENTILATOR SETTINGS: Vent Mode:  [-] PSV FiO2 (%):  [40 %] 40 % Set Rate:  [24 bmp] 24 bmp Vt Set:  [500 mL] 500 mL PEEP:  [5 cmH20-8 cmH20] 5 cmH20 Pressure Support:  [5 cmH20-10 cmH20] 5 cmH20 Plateau Pressure:  [16 cmH20-20 cmH20] 18 cmH20   INTAKE /  OUTPUT:  Intake/Output Summary (Last 24 hours) at 02/16/14 0901 Last data filed at 02/16/14 0700  Gross per 24 hour  Intake   1540 ml  Output   4350 ml  Net  -2810 ml    PHYSICAL EXAMINATION: General:  Morbidly obese male  On vent Neuro:  Calm, awake, follows commands, mouths words appropriate, MAE  HEENT:  OETT, mm pink/moist, short/thick neck.  Poor dentition.  Cardiovascular:  s1s2 irr irr, tachy, no m/r/g Lungs: resps even, non labored  Abdomen:  Obese, soft, bsx4 active  Musculoskeletal:  No acute deformities Skin:  Warm/dry, 1+ BLE pitting edema  LABS: PULMONARY  Recent Labs Lab 02/12/14 0820 02/12/14 1140 02/12/14 1304  PHART 7.071*  --  7.451*  PCO2ART CRITICAL RESULT CALLED TO, READ BACK BY AND VERIFIED WITH:  --  46.8*  PO2ART 90.3  --  204.0*  HCO3 33.8*  --  32.6*  TCO2 37.6  --  34  O2SAT 91.4 77.3 100.0    CBC  Recent Labs Lab 02/14/14 0420 02/15/14 0350 02/16/14 0400  HGB 9.6* 9.9* 10.9*  HCT 35.3* 36.4* 40.5  WBC 6.3 5.8 8.2  PLT 237 237 229    COAGULATION  Recent Labs Lab 02/12/14 0545 02/13/14 0440 02/14/14 0420 02/15/14 0350 02/16/14 0400  INR 1.64* 1.68* 1.60* 1.36 1.30    CARDIAC    Recent Labs Lab 02/10/14 1401 02/12/14  1010 02/12/14 1610 02/12/14 2155  TROPONINI <0.03 <0.03 <0.03 <0.03   No results for input(s): PROBNP in the last 168 hours.   CHEMISTRY  Recent Labs Lab 02/13/14 0440  02/14/14 0420 02/14/14 1231 02/14/14 2300 02/15/14 0350 02/16/14 0400  NA 141  --  146*  --  144 145 148*  K 4.0  --  3.7  --  4.4 4.0 4.4  CL 102  --  104  --  103 104 108  CO2 32  --  33*  --  36* 30 35*  GLUCOSE 169*  --  163*  --  186* 161* 187*  BUN 50*  --  48*  --  47* 46* 37*  CREATININE 1.59*  --  1.37*  --  1.28 1.21 1.15  CALCIUM 8.8  --  9.1  --  9.1 8.9 9.7  MG  --   < > 2.2 2.3 2.3 2.2 2.2  PHOS  --   < > 3.8 3.8 4.6 4.1 3.6  < > = values in this interval not displayed. Estimated Creatinine Clearance:  80.4 mL/min (by C-G formula based on Cr of 1.15).   LIVER  Recent Labs Lab 02/12/14 0545 02/13/14 0440 02/14/14 0420 02/15/14 0350 02/16/14 0400  INR 1.64* 1.68* 1.60* 1.36 1.30     INFECTIOUS  Recent Labs Lab 02/13/14 1112 02/14/14 0050 02/14/14 0420 02/15/14 0350  LATICACIDVEN  --  0.9  --   --   PROCALCITON 0.14  --  0.15 <0.10     ENDOCRINE CBG (last 3)   Recent Labs  02/15/14 2350 02/16/14 0347 02/16/14 0732  GLUCAP 178* 162* 201*     IMAGING x48h Dg Chest Portable 1 View  02/16/2014   CLINICAL DATA:  Respiratory failure.  EXAM: PORTABLE CHEST - 1 VIEW  COMPARISON:  02/15/2014  FINDINGS: Endotracheal tube is in place with tip 5.8 cm above carina. Nasogastric tube is in place with tip be on the film but be on the gastroesophageal junction. Left IJ central line tip overlies the superior vena cava.  The heart is enlarged. There is patchy density at both lung bases consistent with atelectasis or infiltrate. Suspect bilateral small effusions.  IMPRESSION: 1. Lines and tubes as described. 2. Persistent bibasilar atelectasis or infiltrates.   Electronically Signed   By: Nolon Nations M.D.   On: 02/16/2014 08:31   Dg Chest Port 1 View  02/15/2014   CLINICAL DATA:  Respiratory failure  EXAM: PORTABLE CHEST - 1 VIEW  COMPARISON:  Chest x-ray from 2 days prior.  FINDINGS: Endotracheal tube is in stable position, tip at the clavicular heads. Left IJ catheter, tip near the distal left brachiocephalic vein. The gastric suction tube is in good position.  Improved aeration, likely resolved pulmonary edema. There are bilateral pleural effusions, at least moderate on the right with layering. Focal right basilar opacity. No air leak.  Stable cardiomegaly and upper mediastinal widening.  IMPRESSION: 1. Resolved pulmonary edema. Right more than left layering pleural effusions persist. 2. Focal opacity at the right base which could reflect pneumonia or atelectasis. 3. Stable  positioning of tubes and central line.   Electronically Signed   By: Monte Fantasia M.D.   On: 02/15/2014 08:14     ASSESSMENT / PLAN:  PULMONARY OETT 2/10 >>  A: Acute Hypercarbic Respiratory Failure - in the setting of decompensated dCHF, AKI, untreated OSA OSA - suspect non-compliant with CPAP Pulmonary edema Pleural effusions  Former Smoker  P:   Daily  SBT - proceed with extubation   Will need qhs bipap post extubation    CARDIOVASCULAR CVL L IJ TLC 2/10 >>  A:  Acute on chronic systolic CHF Hypotension - likely sedation related Atrial Fibrillation - chronic, on coumadin - RVR 2/13 P:  Change lasix to 9m q12 (was TID) decrease cardizem & uptitrate metoprolol per cards2/13 Cont hold home bisoprolol, metolazone, lisinopril Continue ASA  Cont heparin gtt per pharmacy, hold home coumadin -can do lovenox in 24h    RENAL A:   Acute on Chronic Kidney Disease - improved.   Mild hypernatremia P:   F/u chem    GASTROINTESTINAL A:   Morbid Obesity  Vent Associated Dysphagia  P:   TF  PPI   HEMATOLOGIC A:   Anemia - recent work up per primary MD in process, hx FOB +  P:  DVT Px: heparin gtt (previously on coumadin as outpatient) Trend CBC    INFECTIOUS A:   No active infection - pct neg  P:   Monitor off abx Trend fever curve / WBC    ENDOCRINE A:   DM Gout P:   SSI  Hold home allopurinol    NEUROLOGIC A:   Acute Metabolic Encephalopathy  P:   RASS goal: -1 Fentanyl prn   FAMILY  -  Update: ex wife (after pt consent ) 2/13, need to formalise POA   GLOBAL - extubate to [place] uptitrate lopressor, ct heparin   The patient is critically ill with multiple organ systems failure and requires high complexity decision making for assessment and support, frequent evaluation and titration of therapies, application of advanced monitoring technologies and extensive interpretation of multiple databases. Critical Care Time devoted to patient care  services described in this note independent of APP time is 35 minutes.  02/16/2014  9:01 AM  ARigoberto Noel MD

## 2014-02-16 NOTE — Procedures (Signed)
Extubation Procedure Note  Patient Details:   Name: William Flores DOB: [birth year] MRN: [id number]   Airway Documentation:     Evaluation  O2 sats: stable throughout Complications: No apparent complications Patient did tolerate procedure well. Bilateral Breath Sounds: Rhonchi Suctioning: Airway Yes   Pt tolerated wean, positive for cuff leak, extubated to 5L [place]. No stridor or dyspnea noted after extubation. Pt resting comfortably at this time. RT will continue to monitor.   Mariam Dollar 02/16/2014, 10:45 AM

## 2014-02-16 NOTE — Progress Notes (Signed)
PROGRESS NOTE  Subjective:    64 y.o. male with a PMHx of atrial fibrillation, hyperlipidemia, diabetes mellitus, morbid obesity with obesity hypoventilation syndrome and obstructive sleep apnea,, who was admitted to [facility] on 02/10/2014 for evaluation of worsening heart failure symptoms..  Echo reveals new systolic dysfunction in addition to the previously known diastolic dysfunction.   We have started him on scheduled metoprolol and have been titrating his diltiazem down because of his systolic dysfunction.   Objective:    Vital Signs:   Temp:  [98 F (36.7 C)-99.8 F (37.7 C)] 98.6 F (37 C) (02/14 0740) Pulse Rate:  [29-144] 114 (02/14 0900) Resp:  [13-25] 16 (02/14 0900) BP: (101-166)/(66-104) 138/97 mmHg (02/14 0900) SpO2:  [93 %-100 %] 100 % (02/14 0900) Arterial Line BP: (105-172)/(67-116) 106/103 mmHg (02/14 0000) FiO2 (%):  [40 %] 40 % (02/14 0833) Weight:  [274 lb 4 oz (124.4 kg)] 274 lb 4 oz (124.4 kg) (02/14 0418)  Last BM Date: 02/15/14   24-hour weight change: Weight change: -2 lb 6.8 oz (-1.1 kg)  Weight trends: Filed Weights   02/14/14 0428 02/15/14 0353 02/16/14 0418  Weight: 285 lb 4.4 oz (129.4 kg) 276 lb 10.8 oz (125.5 kg) 274 lb 4 oz (124.4 kg)    Intake/Output:  02/13 0701 - 02/14 0700 In: 1708.6 [I.V.:868.6; NG/GT:840] Out: 4675 [Urine:4675] Total I/O In: 147.3 [I.V.:77.3; NG/GT:70] Out: -    Physical Exam: BP 138/97 mmHg  Pulse 114  Temp(Src) 98.6 F (37 C) (Oral)  Resp 16  Ht 5' 4"  (1.626 m)  Wt 274 lb 4 oz (124.4 kg)  BMI 47.05 kg/m2  SpO2 100%  Wt Readings from Last 3 Encounters:  02/16/14 274 lb 4 oz (124.4 kg)  02/10/14 294 lb 12.8 oz (133.72 kg)  12/19/13 284 lb (128.822 kg)    General: Vital signs reviewed and noted. Intubated, awake and alert  Head: Normocephalic, atraumatic. ETT in place   Eyes: conjunctivae/corneas clear.  EOM's intact.   Throat: normal  Neck:  thick   Lungs:    mostly clear , still intubated    Heart:   Irreg. Irreg.   Abdomen:  Soft, non-tender, non-distended  Obese   Extremities:  chronic stasis changes, no signficant edema at this time.    Neurologic: A&O X3, CN II - XII are grossly intact.   Psych: Normal     Labs: BMET:  Recent Labs  02/15/14 0350 02/16/14 0400  NA 145 148*  K 4.0 4.4  CL 104 108  CO2 30 35*  GLUCOSE 161* 187*  BUN 46* 37*  CREATININE 1.21 1.15  CALCIUM 8.9 9.7  MG 2.2 2.2  PHOS 4.1 3.6    Liver function tests: No results for input(s): AST, ALT, ALKPHOS, BILITOT, PROT, ALBUMIN in the last 72 hours. No results for input(s): LIPASE, AMYLASE in the last 72 hours.  CBC:  Recent Labs  02/15/14 0350 02/16/14 0400  WBC 5.8 8.2  HGB 9.9* 10.9*  HCT 36.4* 40.5  MCV 83.7 84.4  PLT 237 229    Cardiac Enzymes: No results for input(s): CKTOTAL, CKMB, TROPONINI in the last 72 hours.  Coagulation Studies:  Recent Labs  02/14/14 0420 02/15/14 0350 02/16/14 0400  LABPROT 19.2* 16.9* 16.4*  INR 1.60* 1.36 1.30    Other: Invalid input(s): POCBNP No results for input(s): DDIMER in the last 72 hours. No results for input(s): HGBA1C in the last 72 hours. No results for input(s): CHOL, HDL, LDLCALC, TRIG,  CHOLHDL in the last 72 hours. No results for input(s): TSH, T4TOTAL, T3FREE, THYROIDAB in the last 72 hours.  Invalid input(s): FREET3 No results for input(s): VITAMINB12, FOLATE, FERRITIN, TIBC, IRON, RETICCTPCT in the last 72 hours.   Other results:  Tele  ( personally reviewed )  - atrial fib.  HR is fast.      Medications:    Infusions: . sodium chloride 10 mL/hr at 02/14/14 2000  . heparin 2,050 Units/hr (02/14/14 2038)    Scheduled Medications: . antiseptic oral rinse  7 mL Mouth Rinse QID  . aspirin  81 mg Oral Daily  . chlorhexidine  15 mL Mouth Rinse BID  . diltiazem  60 mg Per Tube 4 times per day  . docusate  100 mg Per Tube BID  . etomidate  20 mg Intravenous Once  . furosemide  60 mg Intravenous Q12H  .  insulin aspart  0-20 Units Subcutaneous 6 times per day  . ketotifen  1 drop Both Eyes BID  . metoprolol tartrate  25 mg Oral TID  . pantoprazole sodium  40 mg Per Tube Daily  . polyethylene glycol  17 g Oral Daily  . potassium chloride  40 mEq Per Tube TID    Assessment/ Plan:    1. Acute systolic congestive heart failure: The patient presents with acute shortness of breath. He has known chronic diastolic congestive heart failure and now presents with acute systolic congestive heart failure as well. The exact etiology is not clear. His troponin levels have been negative so far. We will continue to draw additional troponin levels. His B natruretic peptide levels have all been normal.  He's had chronic atrial fibrillation for years and it's possible that he's ever had rapid ventricular response for prolonged period of time prior to his admission. This could certainly contribute to reduced left ventricular ejection fraction.  His heart rate at present is not all that fast. . Given his recent diagnosis of reduced LV function, I would try to minimize are diltiazem while at the same time increasing his beta blocker.  . We will try to taper the diltiazem until we can DC it and titrate up the metoprolol as tolerated.  His BNP levels remain normal . I do not think that he is volume overloaded at this time. He does not have any edema. His weight is at his baseline.   Increase metoprolol to 50 BID,   2. Obesity hypoventilation: Plans per PCCM.  He is being prepped for extubation presently.   3. Chronic diastolic chf: His volume status seems to be euvolemic at this time.   4. Atrial fib: He is on heparin. This is a chronic problem for him. Its not clear whether he's tachycardic because of his respiratory failure or developed respiratory failure as result of being tachycardic. We'll continue for rate control primarily using beta blockers instead of diltiazem.  5. Essential HTN: BP is  ok  6. Morbid obesity:   Following     Disposition:  Length of Stay: 6  Thayer Headings, Brooke Bonito., MD, [facility] 02/16/2014, 10:22 AM Office [phone] Pager [phone]

## 2014-02-17 DIAGNOSIS — J9600 Acute respiratory failure, unspecified whether with hypoxia or hypercapnia: Secondary | ICD-10-CM

## 2014-02-17 DIAGNOSIS — E1129 Type 2 diabetes mellitus with other diabetic kidney complication: Secondary | ICD-10-CM

## 2014-02-17 DIAGNOSIS — E1165 Type 2 diabetes mellitus with hyperglycemia: Secondary | ICD-10-CM

## 2014-02-17 LAB — CBC
HEMATOCRIT: 40.8 % (ref 39.0–52.0)
HEMOGLOBIN: 11.1 g/dL — AB (ref 13.0–17.0)
MCH: 23.3 pg — AB (ref 26.0–34.0)
MCHC: 27.2 g/dL — ABNORMAL LOW (ref 30.0–36.0)
MCV: 85.7 fL (ref 78.0–100.0)
Platelets: 238 10*3/uL (ref 150–400)
RBC: 4.76 MIL/uL (ref 4.22–5.81)
RDW: 22.2 % — AB (ref 11.5–15.5)
WBC: 7.3 10*3/uL (ref 4.0–10.5)

## 2014-02-17 LAB — BASIC METABOLIC PANEL
Anion gap: 7 (ref 5–15)
BUN: 41 mg/dL — ABNORMAL HIGH (ref 6–23)
CO2: 35 mmol/L — ABNORMAL HIGH (ref 19–32)
Calcium: 9.4 mg/dL (ref 8.4–10.5)
Chloride: 105 mmol/L (ref 96–112)
Creatinine, Ser: 1.18 mg/dL (ref 0.50–1.35)
GFR calc non Af Amer: 64 mL/min — ABNORMAL LOW (ref >90)
GFR, EST AFRICAN AMERICAN: 75 mL/min — AB (ref >90)
GLUCOSE: 225 mg/dL — AB (ref 70–99)
Potassium: 4.5 mmol/L (ref 3.5–5.1)
SODIUM: 147 mmol/L — AB (ref 135–145)

## 2014-02-17 LAB — MAGNESIUM: Magnesium: 2.3 mg/dL (ref 1.5–2.5)

## 2014-02-17 LAB — GLUCOSE, CAPILLARY
GLUCOSE-CAPILLARY: 169 mg/dL — AB (ref 70–99)
GLUCOSE-CAPILLARY: 130 mg/dL — AB (ref 70–99)
GLUCOSE-CAPILLARY: 129 mg/dL — AB (ref 70–99)
Glucose-Capillary: 200 mg/dL — ABNORMAL HIGH (ref 70–99)
Glucose-Capillary: 181 mg/dL — ABNORMAL HIGH (ref 70–99)

## 2014-02-17 LAB — HEPARIN LEVEL (UNFRACTIONATED)
HEPARIN UNFRACTIONATED: 0.52 [IU]/mL (ref 0.30–0.70)
Heparin Unfractionated: 0.25 IU/mL — ABNORMAL LOW (ref 0.30–0.70)

## 2014-02-17 LAB — PROTIME-INR
INR: 1.33 (ref 0.00–1.49)
Prothrombin Time: 16.7 seconds — ABNORMAL HIGH (ref 11.6–15.2)

## 2014-02-17 LAB — PHOSPHORUS: PHOSPHORUS: 5.6 mg/dL — AB (ref 2.3–4.6)

## 2014-02-17 MED ORDER — ALUM & MAG HYDROXIDE-SIMETH 200-200-20 MG/5ML PO SUSP
15.0000 mL | Freq: Four times a day (QID) | ORAL | Status: DC | PRN
  Administered 2014-02-17: 15 mL via ORAL
  Filled 2014-02-17: qty 30

## 2014-02-17 MED ORDER — HEPARIN (PORCINE) IN NACL 100-0.45 UNIT/ML-% IJ SOLN
2150.0000 [IU]/h | INTRAMUSCULAR | Status: DC
  Administered 2014-02-17 (×2): 1950 [IU]/h via INTRAVENOUS
  Administered 2014-02-18 – 2014-02-21 (×6): 2150 [IU]/h via INTRAVENOUS
  Filled 2014-02-17 (×13): qty 250

## 2014-02-17 MED ORDER — INSULIN GLARGINE 100 UNIT/ML ~~LOC~~ SOLN
10.0000 [IU] | Freq: Every day | SUBCUTANEOUS | Status: DC
  Administered 2014-02-17 – 2014-02-18 (×2): 10 [IU] via SUBCUTANEOUS
  Filled 2014-02-17 (×3): qty 0.1

## 2014-02-17 MED ORDER — METOPROLOL TARTRATE 50 MG PO TABS
75.0000 mg | ORAL_TABLET | Freq: Two times a day (BID) | ORAL | Status: DC
  Administered 2014-02-17 – 2014-02-18 (×4): 75 mg via ORAL
  Filled 2014-02-17 (×4): qty 1

## 2014-02-17 NOTE — Progress Notes (Signed)
ANTICOAGULATION CONSULT NOTE - Follow Up Consult  Pharmacy Consult for heparin Indication: atrial fibrillation  Labs:  Recent Labs  02/15/14 0350  02/16/14 0400 02/16/14 0915 02/17/14 0350 02/17/14 2300  HGB 9.9*  --  10.9*  --  11.1*  --   HCT 36.4*  --  40.5  --  40.8  --   PLT 237  --  229  --  238  --   LABPROT 16.9*  --  16.4*  --  16.7*  --   INR 1.36  --  1.30  --  1.33  --   HEPARINUNFRC 0.31  < >  --  0.51 0.52 0.25*  CREATININE 1.21  --  1.15  --  1.18  --   < > = values in this interval not displayed.    Assessment: 64yo male slightly subtherapeutic on heparin after resumed when bleeding from IJ resolved; per RN no bleeding since heparin resumed.  Goal of Therapy:  Heparin level 0.3-0.7 units/ml   Plan:  Will increase heparin gtt slightly to 2000 units/hr and check level with am labs.  Wynona Neat, PharmD, BCPS  02/17/2014,11:35 PM

## 2014-02-17 NOTE — Progress Notes (Signed)
Took patient off BIPAP and placed on 4LNC .  Patient is tolerating well at this time: sat 100%, HR 130, RR 16.  RT will continue to monitor.

## 2014-02-17 NOTE — Progress Notes (Addendum)
ANTICOAGULATION CONSULT NOTE - Follow Up Consult  Pharmacy Consult:  Heparin Indication: atrial fibrillation  Patient Measurements: Height: 5' 4"  (162.6 cm) Weight: 267 lb 13.7 oz (121.5 kg) IBW/kg (Calculated) : 59.2 Heparin Dosing Weight: 91 kg  Vital Signs: Temp: 97.6 F (36.4 C) (02/15 0400) Temp Source: Oral (02/15 0400) BP: 118/74 mmHg (02/15 0700) Pulse Rate: 110 (02/15 0700)  Labs:  Recent Labs  02/15/14 0350 02/15/14 1745 02/16/14 0400 02/16/14 0915 02/17/14 0350  HGB 9.9*  --  10.9*  --  11.1*  HCT 36.4*  --  40.5  --  40.8  PLT 237  --  229  --  238  LABPROT 16.9*  --  16.4*  --  16.7*  INR 1.36  --  1.30  --  1.33  HEPARINUNFRC 0.31 0.50  --  0.51 0.52  CREATININE 1.21  --  1.15  --  1.18    Estimated Creatinine Clearance: 77.2 mL/min (by C-G formula based on Cr of 1.18).    Assessment: 89 YOM with Afib to continue on IV heparin while Coumadin is on hold.  Heparin level therapeutic and stable.  No overt bleeding reported.   Goal of Therapy:  Heparin level 0.3-0.7 units/ml Monitor platelets by anticoagulation protocol: Yes    Plan:  - Continue heparin gtt at 2050 units/hr - Daily HL / CBC - Follow-up when to restart oral anticoagulation    Maxwell Martorano D. Mina Marble, PharmD, BCPS Pager:  [phone] 02/17/2014, 8:17 AM   ========================================   Addendum: - bleeding from IJ and urine is red - received order from Dr. Elsworth Soho to hold heparin gtt for 6 hrs (held around 1100)   Plan: - At 1700, resume heparin gtt at lower rate of 1950 units/hr (RN aware to notify pharmacy should bleeding continues) - Check 6 hr HL post heparin resumed - Daily HL / CBC    Promise Bushong D. Mina Marble, PharmD, BCPS Pager:  [phone] 02/17/2014, 1:41 PM

## 2014-02-17 NOTE — Progress Notes (Signed)
Upon assessment, patient's foley catheter was noted to be draining red colored urine.  Left IJ site also noted to have bloody drainage at the insertion site.  While taking oral medications, patient began vomiting a large amount brown emesis.  Dr Elsworth Soho paged and notified.  He ordered Heparin to be stopped for at least 6 hours,notify pharmacy of this medication change, and remove foley catheter. Patient appears to be stable at this time.  Will continue to monitor.

## 2014-02-17 NOTE — Progress Notes (Signed)
SUBJECTIVE:  No palpitations  OBJECTIVE:   Vitals:   Filed Vitals:   02/17/14 0856 02/17/14 0900 02/17/14 1018 02/17/14 1217  BP:  118/67 115/83 125/77  Pulse:  79 92 97  Temp: 98.2 F (36.8 C)  97.7 F (36.5 C) 97.4 F (36.3 C)  TempSrc: Oral  Oral Oral  Resp:  15 19 29   Height:   5' 7"  (1.702 m)   Weight:   174 lb 3.2 oz (79.017 kg)   SpO2:  100% 97% 100%   I&O's:   Intake/Output Summary (Last 24 hours) at 02/17/14 1435 Last data filed at 02/17/14 1200  Gross per 24 hour  Intake 1430.5 ml  Output   3300 ml  Net -1869.5 ml   TELEMETRY: Reviewed telemetry pt in AFib, rate controlled:     PHYSICAL EXAM General: Well developed, well nourished, in no acute distress Head:   Normal cephalic and atramatic  Lungs:   Clear bilaterally to auscultation. Heart:  Irregularly irregular S1 S2  No JVD.   Abdomen: obese  Extremities:  Chronic skin changes.   Neuro: Alert and oriented. Psych:  Normal affect, responds appropriately Skin: No rash   LABS: Basic Metabolic Panel:  Recent Labs  02/16/14 0400 02/17/14 0350  NA 148* 147*  K 4.4 4.5  CL 108 105  CO2 35* 35*  GLUCOSE 187* 225*  BUN 37* 41*  CREATININE 1.15 1.18  CALCIUM 9.7 9.4  MG 2.2 2.3  PHOS 3.6 5.6*   Liver Function Tests: No results for input(s): AST, ALT, ALKPHOS, BILITOT, PROT, ALBUMIN in the last 72 hours. No results for input(s): LIPASE, AMYLASE in the last 72 hours. CBC:  Recent Labs  02/16/14 0400 02/17/14 0350  WBC 8.2 7.3  HGB 10.9* 11.1*  HCT 40.5 40.8  MCV 84.4 85.7  PLT 229 238   Cardiac Enzymes: No results for input(s): CKTOTAL, CKMB, CKMBINDEX, TROPONINI in the last 72 hours. BNP: Invalid input(s): POCBNP D-Dimer: No results for input(s): DDIMER in the last 72 hours. Hemoglobin A1C: No results for input(s): HGBA1C in the last 72 hours. Fasting Lipid Panel: No results for input(s): CHOL, HDL, LDLCALC, TRIG, CHOLHDL, LDLDIRECT in the last 72 hours. Thyroid Function  Tests: No results for input(s): TSH, T4TOTAL, T3FREE, THYROIDAB in the last 72 hours.  Invalid input(s): FREET3 Anemia Panel: No results for input(s): VITAMINB12, FOLATE, FERRITIN, TIBC, IRON, RETICCTPCT in the last 72 hours. Coag Panel:   Lab Results  Component Value Date   INR 1.33 02/17/2014   INR 1.30 02/16/2014   INR 1.36 02/15/2014    RADIOLOGY: Dg Chest 2 View  02/10/2014   CLINICAL DATA:  Shortness of breath and decreased oxygen saturations.  EXAM: CHEST  2 VIEW  COMPARISON:  10/28/2013 and 08/05/2013  FINDINGS: Chronic pleural thickening along both sides of the chest. Heart size is upper limits of normal but unchanged. The trachea is midline. Concern for small pleural effusions on the lateral view. Prominent lung markings suggest vascular congestion or mild edema. Trachea is midline.  IMPRESSION: Vascular congestion or mild interstitial edema.  Concern for small pleural effusions.   Electronically Signed   By: Markus Daft M.D.   On: 02/10/2014 14:16   Dg Chest 2 View  02/10/2014   CLINICAL DATA:  Cough and shortness of breath for 2 months.  EXAM: CHEST  2 VIEW  COMPARISON:  PA and lateral chest 08/05/2013, 02/20/2006 and 02/10/2014.  FINDINGS: Small chronic bilateral pleural effusions versus pleural thickening along the chest  walls appear unchanged. There is cardiomegaly. Heart size is upper normal. No pneumothorax or pleural effusion.  IMPRESSION: No acute disease.   Electronically Signed   By: Inge Rise M.D.   On: 02/10/2014 13:49   Dg Chest Portable 1 View  02/16/2014   CLINICAL DATA:  Respiratory failure.  EXAM: PORTABLE CHEST - 1 VIEW  COMPARISON:  02/15/2014  FINDINGS: Endotracheal tube is in place with tip 5.8 cm above carina. Nasogastric tube is in place with tip be on the film but be on the gastroesophageal junction. Left IJ central line tip overlies the superior vena cava.  The heart is enlarged. There is patchy density at both lung bases consistent with atelectasis or  infiltrate. Suspect bilateral small effusions.  IMPRESSION: 1. Lines and tubes as described. 2. Persistent bibasilar atelectasis or infiltrates.   Electronically Signed   By: Nolon Nations M.D.   On: 02/16/2014 08:31   Dg Chest Port 1 View  02/15/2014   CLINICAL DATA:  Respiratory failure  EXAM: PORTABLE CHEST - 1 VIEW  COMPARISON:  Chest x-ray from 2 days prior.  FINDINGS: Endotracheal tube is in stable position, tip at the clavicular heads. Left IJ catheter, tip near the distal left brachiocephalic vein. The gastric suction tube is in good position.  Improved aeration, likely resolved pulmonary edema. There are bilateral pleural effusions, at least moderate on the right with layering. Focal right basilar opacity. No air leak.  Stable cardiomegaly and upper mediastinal widening.  IMPRESSION: 1. Resolved pulmonary edema. Right more than left layering pleural effusions persist. 2. Focal opacity at the right base which could reflect pneumonia or atelectasis. 3. Stable positioning of tubes and central line.   Electronically Signed   By: Monte Fantasia M.D.   On: 02/15/2014 08:14   Portable Chest Xray  02/13/2014   CLINICAL DATA:  Sleep apnea.  Shortness of breath.  EXAM: PORTABLE CHEST - 1 VIEW  COMPARISON:  02/12/2014.  FINDINGS: Endotracheal tube, left IJ line, NG tube in stable position. Cardiomegaly with bilateral pulmonary alveolar infiltrates. Bilateral effusions. No pneumothorax. No acute bony abnormality.  IMPRESSION: 1. Lines and tubes in stable position. 2. Congestive heart failure with bilateral pulmonary edema increased from prior exams. Bilateral small pleural effusions.   Electronically Signed   By: Marcello Moores  Register   On: 02/13/2014 07:31   Dg Chest Port 1 View  02/12/2014   CLINICAL DATA:  64 year old male currently admitted. Evaluate central line placement.  EXAM: PORTABLE CHEST - 1 VIEW  COMPARISON:  Prior chest x-ray 02/12/2014  FINDINGS: New left IJ approach central venous catheter.  Catheter tip overlies the junction of the left innominate vein and the superior vena cava. Limited evaluation of the left hemi thorax. No pneumothorax. Bilateral pleural effusions, pulmonary edema and dense left retrocardiac opacity are all similar.  IMPRESSION: 1. The tip of the new left IJ approach central venous catheter overlies the junction of the left innominate vein and the SVC. 2. No definite pneumothorax although evaluation of the left chest is very limited given patient positioning.   Electronically Signed   By: Jacqulynn Cadet M.D.   On: 02/12/2014 10:33   Portable Chest Xray  02/12/2014   CLINICAL DATA:  Obstructive sleep apnea  EXAM: PORTABLE CHEST - 1 VIEW  COMPARISON:  02/10/2014  FINDINGS: Low volumes. Endotracheal tube in place with its tip 5.8 cm from the carina. Multi focal bilateral patchy airspace opacities. Small right pleural effusion.  IMPRESSION: Endotracheal tube 5.8 cm from the  carina  CHF.   Electronically Signed   By: Marybelle Killings M.D.   On: 02/12/2014 09:31   Dg Abd Portable 1v  02/12/2014   CLINICAL DATA:  Orogastric tube placement  EXAM: PORTABLE ABDOMEN - 1 VIEW  COMPARISON:  10/28/2013  FINDINGS: An orogastric tube is coiled in the stomach. No incidental acute findings, the bowel gas pattern is nonobstructive.  IMPRESSION: Orogastric tube tip in the upper stomach.   Electronically Signed   By: Monte Fantasia M.D.   On: 02/12/2014 12:52      ASSESSMENT: Kathyrn Lass:  AFib: Rate controlled on current dose of metoprolol.  Cardiomyopathy: EF was 25% in 2008 with clean cath at that time.  Normal EF in 8/15.  It was thought he had AFib with RVR in recent months and now EF 35-40%.  COntinue medical therapy.  Repeat echo in 6 weeks.  If EF does not improve, could consider cath at that time.   If he has CP sx, could consider cath.  Jettie Booze, MD  02/17/2014  2:35 PM

## 2014-02-17 NOTE — Evaluation (Signed)
Physical Therapy Re-Evaluation Patient Details Name: William Flores MRN: [id number] DOB: June 03, 1950 Today's Date: 02/17/2014   History of Present Illness  Pt is a 64 y.o. male, known PMH diabetes, atrial fibrillation, sleep apnea on nocturnal CPAP, diastolic heart failure, likely right-sided heart failure and morbid obesity. Sent to the emergency department with shortness of breath progressive over one week. This was associated with a 10 pound weight gain and a sensation of abdominal fullness. He denies any progression in his baseline orthopnea. Evaluation in the ER revealed slight acute renal insufficiency on chronic kidney disease. 2/10 decompensated on floor with hypercarbic respiratory failure, to ICU for emergent intubation. Extubated 2/14.  Clinical Impression  Pt re-evaluated following the above complications, now with improved status after being extubated 2/14. Pt currently with functional limitations due to the deficits listed below (see PT Problem List). Was at a supervision level without an assistive device to ambulate prior to decline, now requires rolling walker for support. SpO2 readings were highly variable between 50-98% with and without supplemental O2, and therefore question accuracy of readings. He was mildly dyspneic. States he may be able to receive 24 hour care from his ex wife for a period of time while he recovers. Pt will benefit from skilled PT to increase their independence and safety with mobility to allow discharge to the venue listed below.        Follow Up Recommendations Home health PT;Supervision/Assistance - 24 hour    Equipment Recommendations   (TBD 3 in 1 vs. tub bench.)    Recommendations for Other Services       Precautions / Restrictions Precautions Precautions: Fall Precaution Comments: Watch O2 sats Restrictions Weight Bearing Restrictions: No      Mobility  Bed Mobility Overal bed mobility: Modified Independent             General bed  mobility comments: Pt was able to transition to EOB with no physical assistance. Minimal use of railings for support.   Transfers Overall transfer level: Needs assistance Equipment used: Rolling walker (2 wheeled) Transfers: Sit to/from Stand Sit to Stand: Min guard         General transfer comment: Min guard for safety. No physical assist required. Cues for hand placement.  Ambulation/Gait Ambulation/Gait assistance: Min guard;+2 safety/equipment Ambulation Distance (Feet): 85 Feet Assistive device: Rolling walker (2 wheeled) Gait Pattern/deviations: Step-through pattern;Decreased stride length;Trunk flexed Gait velocity: Decreased   General Gait Details: SpO2 monitor with poor readings throughout ambulatory bout ranging from 50%-95% while on room air, only mild dyspnea 2/4. Provided 4L supplemental O2 at end of bout to see if SpO2 reading would improve but values remained scattered. No loss of balance noted during bout. Pt does report LE fatigue and did not wish to ambulate further at this time. Educated on safe DME use with a rolling walker for stability.  Stairs            Wheelchair Mobility    Modified Rankin (Stroke Patients Only)       Balance Overall balance assessment: Needs assistance Sitting-balance support: No upper extremity supported;Feet supported Sitting balance-Leahy Scale: Good     Standing balance support: No upper extremity supported Standing balance-Leahy Scale: Fair                               Pertinent Vitals/Pain Pain Assessment: No/denies pain  At rest: HR 101 monitor states afib SpO2 98% on 4L supplemental O2  BP 106/62  Ambulating: HR 112 SpO2 50%-95% on room air SpO2 50%-98% on 4L supplemental O2     Home Living Family/patient expects to be discharged to:: Private residence Living Arrangements: Alone Available Help at Discharge: Family;Friend(s);Available PRN/intermittently (May be able to stay with ex-wife) Type  of Home: House Home Access: Stairs to enter   Entrance Stairs-Number of Steps: 3 Home Layout: One level Home Equipment: Walker - 2 wheels;Cane - single point      Prior Function Level of Independence: Independent         Comments: Has been retired for 1 month     Hand Dominance   Dominant Hand: Right    Extremity/Trunk Assessment   Upper Extremity Assessment: Defer to OT evaluation           Lower Extremity Assessment: Generalized weakness      Cervical / Trunk Assessment: Normal  Communication   Communication: No difficulties  Cognition Arousal/Alertness: Awake/alert Behavior During Therapy: WFL for tasks assessed/performed Overall Cognitive Status: Within Functional Limits for tasks assessed                      General Comments General comments (skin integrity, edema, etc.): Poor SpO2 reading with pulse ox and monitor ranging from 50-98% on room air and 4L supplemental O2. Mild dyspnea noted with and without supplemental O2.    Exercises General Exercises - Lower Extremity Ankle Circles/Pumps: AROM;Both;10 reps;Seated Quad Sets: Strengthening;Both;5 reps;Seated Heel Slides: Strengthening;Both;5 reps;Seated      Assessment/Plan    PT Assessment Patient needs continued PT services  PT Diagnosis Difficulty walking;Generalized weakness   PT Problem List Decreased strength;Decreased range of motion;Decreased activity tolerance;Decreased balance;Decreased mobility;Decreased knowledge of use of DME;Cardiopulmonary status limiting activity  PT Treatment Interventions DME instruction;Gait training;Stair training;Functional mobility training;Therapeutic activities;Therapeutic exercise;Neuromuscular re-education;Patient/family education;Balance training   PT Goals (Current goals can be found in the Care Plan section) Acute Rehab PT Goals Patient Stated Goal: Return home and avoid being on home O2 during the day.  PT Goal Formulation: With patient Time  For Goal Achievement: 03/03/14 Potential to Achieve Goals: Good    Frequency Min 3X/week   Barriers to discharge Decreased caregiver support Does not have 24 hour care, but states he may now be able to stay with his ex-wife instead of at home alone    Co-evaluation               End of Session Equipment Utilized During Treatment: Oxygen;Gait belt Activity Tolerance: Patient tolerated treatment well Patient left: in chair;with call bell/phone within reach Nurse Communication: Mobility status (SpO2 readings varied)         Time: 3159-4585 PT Time Calculation (min) (ACUTE ONLY): 25 min   Charges:   PT Evaluation $PT Re-evaluation: 1 Procedure PT Treatments $Gait Training: 8-22 mins   PT G CodesEllouise Newer 02/17/2014, 4:50 PM Camille Bal [place], [place]

## 2014-02-17 NOTE — Progress Notes (Addendum)
RN calling eMD  Patient wants a break from BiPAP  On camera - looks stable  Plan Ok to come off bipap and monitor  Dr. Brand Males, M.D., [facility].C.P Pulmonary and Critical Care Medicine Staff Physician [facility] Pager: [phone], If no answer or between  15:00h - 7:00h: call [phone]  02/17/2014 3:18 AM

## 2014-02-17 NOTE — Progress Notes (Addendum)
PULMONARY / CRITICAL CARE MEDICINE   Name: William Flores MRN: [id number] DOB: 06/21/1950    ADMISSION DATE:  02/10/2014 CONSULTATION DATE:  02/12/14  REFERRING MD :  Dr. Candiss Norse   CHIEF COMPLAINT:  Acute Respiratory Failure / AMS   INITIAL PRESENTATION:     64 y/o M with a PMH of Afib, HLD, DM, OSA on CPAP, dCHF who presented 2/8 from [place] with SOB, weight gain, non productive cough and orthopnea. Hypoxic on presentation with sats 70's.  Initially admitted by [facility] with decompensated CHF and AKI.  Attempted diuresis and bipap, but pt refused bipap.  On 2/10, he decompensated with worsening of hypercarbic respiratory failure and was emergently transferred to ICU for intubation.     SIGNIFICANT EVENTS: 2/08  Admit with AKI / decompensated CHF 2/10  Decompensated on floor with hypercarbic respiratory failure, to ICU for emergent intubation.  02/13/14 - on levophed, heparin gtt and fent gtt with versed prn. Creat better. On vent. ECHO with LVEF 35%, SCVO2 77%  2/13 cards consult 2/14 RVR, extubated  STUDIES:  2D echo 2/10>>> EF 35-40%,  Diffuse hypokinesis, mild TR, normal PASP    SUBJECTIVE/OVERNIGHT/INTERVAL HX Did well post extubation, mild stridor - racemic epi 2/14 , now better Used bipap overnight C/o heartburn Denies abd pain  VITAL SIGNS: Temp:  [97.4 F (36.3 C)-98.8 F (37.1 C)] 97.6 F (36.4 C) (02/15 0400) Pulse Rate:  [79-147] 110 (02/15 0700) Resp:  [12-25] 12 (02/15 0700) BP: (92-166)/(64-97) 118/74 mmHg (02/15 0700) SpO2:  [92 %-100 %] 99 % (02/15 0700) Arterial Line BP: (137)/(63) 137/63 mmHg (02/14 2000) FiO2 (%):  [40 %] 40 % (02/15 0138) Weight:  [121.5 kg (267 lb 13.7 oz)] 121.5 kg (267 lb 13.7 oz) (02/15 0350)   HEMODYNAMICS: CVP:  [9 mmHg-15 mmHg] 11 mmHg   VENTILATOR SETTINGS: Vent Mode:  [-] BIPAP FiO2 (%):  [40 %] 40 % Set Rate:  [15 bmp] 15 bmp PEEP:  [5 cmH20] 5 cmH20 Pressure Support:  [5 cmH20] 5 cmH20   INTAKE /  OUTPUT:  Intake/Output Summary (Last 24 hours) at 02/17/14 8309 Last data filed at 02/17/14 0700  Gross per 24 hour  Intake 1816.5 ml  Output   4035 ml  Net -2218.5 ml    PHYSICAL EXAMINATION: General:  Morbidly obese male Neuro:  Calm, awake, follows commands, mouths words appropriate, MAE  HEENT:  OETT, mm pink/moist, short/thick neck.  Poor dentition.  Cardiovascular:  s1s2 irr irr, tachy, no m/r/g Lungs: resps even, non labored  Abdomen:  Obese, soft, bsx4 active  Musculoskeletal:  No acute deformities Skin:  Warm/dry, 1+ BLE pitting edema  LABS: PULMONARY  Recent Labs Lab 02/12/14 0820 02/12/14 1140 02/12/14 1304  PHART 7.071*  --  7.451*  PCO2ART CRITICAL RESULT CALLED TO, READ BACK BY AND VERIFIED WITH:  --  46.8*  PO2ART 90.3  --  204.0*  HCO3 33.8*  --  32.6*  TCO2 37.6  --  34  O2SAT 91.4 77.3 100.0    CBC  Recent Labs Lab 02/15/14 0350 02/16/14 0400 02/17/14 0350  HGB 9.9* 10.9* 11.1*  HCT 36.4* 40.5 40.8  WBC 5.8 8.2 7.3  PLT 237 229 238    COAGULATION  Recent Labs Lab 02/13/14 0440 02/14/14 0420 02/15/14 0350 02/16/14 0400 02/17/14 0350  INR 1.68* 1.60* 1.36 1.30 1.33    CARDIAC    Recent Labs Lab 02/10/14 1401 02/12/14 1010 02/12/14 1610 02/12/14 2155  TROPONINI <0.03 <0.03 <0.03 <0.03  No results for input(s): PROBNP in the last 168 hours.   CHEMISTRY  Recent Labs Lab 02/14/14 0420 02/14/14 1231 02/14/14 2300 02/15/14 0350 02/16/14 0400 02/17/14 0350  NA 146*  --  144 145 148* 147*  K 3.7  --  4.4 4.0 4.4 4.5  CL 104  --  103 104 108 105  CO2 33*  --  36* 30 35* 35*  GLUCOSE 163*  --  186* 161* 187* 225*  BUN 48*  --  47* 46* 37* 41*  CREATININE 1.37*  --  1.28 1.21 1.15 1.18  CALCIUM 9.1  --  9.1 8.9 9.7 9.4  MG 2.2 2.3 2.3 2.2 2.2 2.3  PHOS 3.8 3.8 4.6 4.1 3.6 5.6*   Estimated Creatinine Clearance: 77.2 mL/min (by C-G formula based on Cr of 1.18).   LIVER  Recent Labs Lab 02/13/14 0440  02/14/14 0420 02/15/14 0350 02/16/14 0400 02/17/14 0350  INR 1.68* 1.60* 1.36 1.30 1.33     INFECTIOUS  Recent Labs Lab 02/13/14 1112 02/14/14 0050 02/14/14 0420 02/15/14 0350  LATICACIDVEN  --  0.9  --   --   PROCALCITON 0.14  --  0.15 <0.10     ENDOCRINE CBG (last 3)   Recent Labs  02/16/14 1909 02/16/14 2323 02/17/14 0333  GLUCAP 187* 222* 200*     IMAGING x48h Dg Chest Portable 1 View  02/16/2014   CLINICAL DATA:  Respiratory failure.  EXAM: PORTABLE CHEST - 1 VIEW  COMPARISON:  02/15/2014  FINDINGS: Endotracheal tube is in place with tip 5.8 cm above carina. Nasogastric tube is in place with tip be on the film but be on the gastroesophageal junction. Left IJ central line tip overlies the superior vena cava.  The heart is enlarged. There is patchy density at both lung bases consistent with atelectasis or infiltrate. Suspect bilateral small effusions.  IMPRESSION: 1. Lines and tubes as described. 2. Persistent bibasilar atelectasis or infiltrates.   Electronically Signed   By: Nolon Nations M.D.   On: 02/16/2014 08:31     ASSESSMENT / PLAN:  PULMONARY OETT 2/10 >>  A: Acute Hypercarbic Respiratory Failure - in the setting of decompensated dCHF, AKI, untreated OSA OSA - suspect non-compliant with CPAP Pulmonary edema Pleural effusions -resolved Former Smoker  P:    Will need qhs bipap - needs new machine on dc   CARDIOVASCULAR CVL L IJ TLC 2/10 >>  A:  Acute on chronic systolic CHF Hypotension - likely sedation related Atrial Fibrillation - chronic, on coumadin - RVR 2/13 P:  decrease cardizem & uptitrate metoprolol per cards 2/13 Cont hold home bisoprolol, lisinopril Continue ASA  Cont heparin gtt per pharmacy, resume home coumadin  If no procedures planned by cards  RENAL A:   Acute on Chronic Kidney Disease - improved.   Mild hypernatremia P:   F/u chem  Keep lasix to 84m q12 (was TID), resume metolazone   GASTROINTESTINAL A:    Morbid Obesity  P:   Advance PO  PPI Maalox prn   HEMATOLOGIC A:   Anemia - recent work up per primary MD in process, hx FOB +  P:  DVT Px: heparin gtt (previously on coumadin as outpatient) Trend CBC    INFECTIOUS A:   No active infection - pct neg  P:   Monitor off abx Trend fever curve / WBC    ENDOCRINE A:   DM Gout P:   SSI -add lantus 10 u daily with diet Hold home allopurinol  NEUROLOGIC A:   Acute Metabolic Encephalopathy -resolved P:   PT consult   FAMILY  -  Update: ex wife (after pt consent ) 2/13, need to formalise POA   GLOBAL - uptitrate lopressor for better control of HR, ct heparin , noct bipap  Transfer to SDU  The patient is critically ill with multiple organ systems failure and requires high complexity decision making for assessment and support, frequent evaluation and titration of therapies, application of advanced monitoring technologies and extensive interpretation of multiple databases. Critical Care Time devoted to patient care services described in this note independent of APP time is 31 minutes.   Rigoberto Noel MD 02/17/2014  8:12 AM  Rigoberto Noel MD

## 2014-02-18 ENCOUNTER — Other Ambulatory Visit: Payer: Self-pay | Admitting: *Deleted

## 2014-02-18 LAB — HEPARIN LEVEL (UNFRACTIONATED): HEPARIN UNFRACTIONATED: 0.29 [IU]/mL — AB (ref 0.30–0.70)

## 2014-02-18 LAB — GLUCOSE, CAPILLARY
GLUCOSE-CAPILLARY: 147 mg/dL — AB (ref 70–99)
GLUCOSE-CAPILLARY: 159 mg/dL — AB (ref 70–99)
Glucose-Capillary: 157 mg/dL — ABNORMAL HIGH (ref 70–99)
Glucose-Capillary: 169 mg/dL — ABNORMAL HIGH (ref 70–99)
Glucose-Capillary: 139 mg/dL — ABNORMAL HIGH (ref 70–99)
Glucose-Capillary: 147 mg/dL — ABNORMAL HIGH (ref 70–99)

## 2014-02-18 LAB — PROTIME-INR
INR: 1.31 (ref 0.00–1.49)
Prothrombin Time: 16.4 seconds — ABNORMAL HIGH (ref 11.6–15.2)

## 2014-02-18 LAB — BASIC METABOLIC PANEL
ANION GAP: 10 (ref 5–15)
BUN: 47 mg/dL — ABNORMAL HIGH (ref 6–23)
CALCIUM: 9.2 mg/dL (ref 8.4–10.5)
CO2: 30 mmol/L (ref 19–32)
CREATININE: 1.35 mg/dL (ref 0.50–1.35)
Chloride: 103 mmol/L (ref 96–112)
GFR calc non Af Amer: 55 mL/min — ABNORMAL LOW (ref >90)
GFR, EST AFRICAN AMERICAN: 63 mL/min — AB (ref >90)
Glucose, Bld: 159 mg/dL — ABNORMAL HIGH (ref 70–99)
Potassium: 4.7 mmol/L (ref 3.5–5.1)
Sodium: 143 mmol/L (ref 135–145)

## 2014-02-18 MED ORDER — DIGOXIN 125 MCG PO TABS: 0.1250 mg | ORAL_TABLET | Freq: Every day | ORAL | Status: DC

## 2014-02-18 MED ORDER — LORAZEPAM 2 MG/ML IJ SOLN
0.5000 mg | INTRAMUSCULAR | Status: AC | PRN
Start: 2014-02-18 — End: 2014-02-18
  Administered 2014-02-18: 1 mg via INTRAVENOUS
  Filled 2014-02-18: qty 1

## 2014-02-18 MED ORDER — FENTANYL CITRATE 0.05 MG/ML IJ SOLN
25.0000 ug | INTRAMUSCULAR | Status: DC | PRN
  Administered 2014-02-18 (×3): 50 ug via INTRAVENOUS
  Filled 2014-02-18 (×4): qty 2

## 2014-02-18 MED ORDER — DIGOXIN 250 MCG PO TABS
0.2500 mg | ORAL_TABLET | Freq: Three times a day (TID) | ORAL | Status: DC
  Administered 2014-02-18 (×2): 0.25 mg via ORAL
  Filled 2014-02-18 (×2): qty 1

## 2014-02-18 MED ORDER — FUROSEMIDE 40 MG PO TABS
40.0000 mg | ORAL_TABLET | Freq: Two times a day (BID) | ORAL | Status: DC
  Administered 2014-02-18 (×2): 40 mg via ORAL
  Filled 2014-02-18 (×3): qty 1

## 2014-02-18 MED ORDER — DILTIAZEM HCL 30 MG PO TABS
30.0000 mg | ORAL_TABLET | Freq: Four times a day (QID) | ORAL | Status: DC
  Administered 2014-02-18 (×2): 30 mg via ORAL
  Filled 2014-02-18 (×3): qty 1

## 2014-02-18 MED ORDER — MIDAZOLAM HCL 2 MG/2ML IJ SOLN
INTRAMUSCULAR | Status: AC
  Filled 2014-02-18: qty 2

## 2014-02-18 MED ORDER — MENTHOL 3 MG MT LOZG
1.0000 | LOZENGE | OROMUCOSAL | Status: DC | PRN
  Administered 2014-02-18 (×2): 3 mg via ORAL
  Filled 2014-02-18 (×2): qty 9

## 2014-02-18 MED ORDER — WARFARIN - PHARMACIST DOSING INPATIENT: Freq: Every day | Status: DC

## 2014-02-18 MED ORDER — WARFARIN SODIUM 2.5 MG PO TABS
12.5000 mg | ORAL_TABLET | Freq: Once | ORAL | Status: AC
  Administered 2014-02-18: 12.5 mg via ORAL
  Filled 2014-02-18: qty 1

## 2014-02-18 MED ORDER — METOLAZONE 5 MG PO TABS
5.0000 mg | ORAL_TABLET | Freq: Every day | ORAL | Status: AC
  Administered 2014-02-18: 5 mg via ORAL
  Filled 2014-02-18: qty 1

## 2014-02-18 NOTE — Progress Notes (Signed)
PULMONARY / CRITICAL CARE MEDICINE   Name: William Flores MRN: [id number] DOB: [birth year]    ADMISSION DATE:  02/10/2014 CONSULTATION DATE:  02/12/14  REFERRING MD :  Dr. Candiss Norse   CHIEF COMPLAINT:  Acute Respiratory Failure / AMS   INITIAL PRESENTATION:     64 y/o M with a PMH of Afib, HLD, DM, OSA on CPAP, dCHF who presented 2/8 from [place] with SOB, weight gain, non productive cough and orthopnea. Hypoxic on presentation with sats 70's.  Initially admitted by [facility] with decompensated CHF and AKI.  Attempted diuresis and bipap, but pt refused bipap.  On 2/10, he decompensated with worsening of hypercarbic respiratory failure and was emergently transferred to ICU for intubation.     SIGNIFICANT EVENTS: 2/08  Admit with AKI / decompensated CHF 2/10  Decompensated on floor with hypercarbic respiratory failure, to ICU for emergent intubation.  02/13/14 - on levophed, heparin gtt and fent gtt with versed prn. Creat better. On vent. ECHO with LVEF 35%, SCVO2 77%  2/13 cards consult 2/14 RVR, extubated  STUDIES:  2D echo 2/10>>> EF 35-40%,  Diffuse hypokinesis, mild TR, normal PASP    SUBJECTIVE/OVERNIGHT/INTERVAL HX Did well post extubation, mild stridor - racemic epi 2/14 , now better Used bipap overnight C/o heartburn Denies abd pain  VITAL SIGNS: Temp:  [97.4 F (36.3 C)-98.8 F (37.1 C)] 97.5 F (36.4 C) (02/16 0857) Pulse Rate:  [73-116] 90 (02/16 0857) Resp:  [13-93] 16 (02/16 0857) BP: (108-141)/(75-87) 112/87 mmHg (02/16 0857) SpO2:  [93 %-100 %] 100 % (02/16 0857) FiO2 (%):  [35 %] 35 % (02/16 0215) Weight:  [125.9 kg (277 lb 9 oz)] 125.9 kg (277 lb 9 oz) (02/16 0500)   HEMODYNAMICS:     VENTILATOR SETTINGS: Vent Mode:  [-]  FiO2 (%):  [35 %] 35 %   INTAKE / OUTPUT:  Intake/Output Summary (Last 24 hours) at 02/18/14 1105 Last data filed at 02/18/14 0700  Gross per 24 hour  Intake 1644.54 ml  Output    300 ml  Net 1344.54 ml    PHYSICAL  EXAMINATION: General:  Morbidly obese male Neuro:  Calm, awake, follows commands, hoarse but speaking appropriately HEENT:  [place]/AT, PERRL, EOM-I and MMM, hoarse. Cardiovascular:  s1s2 irr irr, tachy, no m/r/g Lungs: resps even, non labored upper airway wheezing sounds noted. Abdomen:  Obese, soft, bsx4 active  Musculoskeletal:  No acute deformities Skin:  Warm/dry, 1+ BLE pitting edema  LABS: PULMONARY  Recent Labs Lab 02/12/14 0820 02/12/14 1140 02/12/14 1304  PHART 7.071*  --  7.451*  PCO2ART CRITICAL RESULT CALLED TO, READ BACK BY AND VERIFIED WITH:  --  46.8*  PO2ART 90.3  --  204.0*  HCO3 33.8*  --  32.6*  TCO2 37.6  --  34  O2SAT 91.4 77.3 100.0   CBC  Recent Labs Lab 02/15/14 0350 02/16/14 0400 02/17/14 0350  HGB 9.9* 10.9* 11.1*  HCT 36.4* 40.5 40.8  WBC 5.8 8.2 7.3  PLT 237 229 238   COAGULATION  Recent Labs Lab 02/14/14 0420 02/15/14 0350 02/16/14 0400 02/17/14 0350 02/18/14 0600  INR 1.60* 1.36 1.30 1.33 1.31   CARDIAC  Recent Labs Lab 02/12/14 1010 02/12/14 1610 02/12/14 2155  TROPONINI <0.03 <0.03 <0.03   No results for input(s): PROBNP in the last 168 hours.  CHEMISTRY  Recent Labs Lab 02/14/14 1231 02/14/14 2300 02/15/14 0350 02/16/14 0400 02/17/14 0350 02/18/14 0600  NA  --  144 145 148* 147* 143  K  --  4.4 4.0 4.4 4.5 4.7  CL  --  103 104 108 105 103  CO2  --  36* 30 35* 35* 30  GLUCOSE  --  186* 161* 187* 225* 159*  BUN  --  47* 46* 37* 41* 47*  CREATININE  --  1.28 1.21 1.15 1.18 1.35  CALCIUM  --  9.1 8.9 9.7 9.4 9.2  MG 2.3 2.3 2.2 2.2 2.3  --   PHOS 3.8 4.6 4.1 3.6 5.6*  --    Estimated Creatinine Clearance: 72.2 mL/min (by C-G formula based on Cr of 1.35).  LIVER  Recent Labs Lab 02/14/14 0420 02/15/14 0350 02/16/14 0400 02/17/14 0350 02/18/14 0600  INR 1.60* 1.36 1.30 1.33 1.31   INFECTIOUS  Recent Labs Lab 02/13/14 1112 02/14/14 0050 02/14/14 0420 02/15/14 0350  LATICACIDVEN  --  0.9  --   --    PROCALCITON 0.14  --  0.15 <0.10   ENDOCRINE CBG (last 3)   Recent Labs  02/17/14 2348 02/18/14 0358 02/18/14 0855  GLUCAP 159* 157* 147*   IMAGING x48h No results found.  ASSESSMENT / PLAN:  PULMONARY OETT 2/10 >> 2/14 A: Acute Hypercarbic Respiratory Failure - in the setting of decompensated dCHF, AKI, untreated OSA OSA - suspect non-compliant with CPAP Pulmonary edema Pleural effusions -resolved Former Smoker  P:   Change to CPAP while asleep. Will need to coordinate a new machine upon discharge, evidently one at home does not work any longer. Titrate O2 for sat of 88-92%.  CARDIOVASCULAR CVL L IJ TLC 2/10 >>  A:  Acute on chronic systolic CHF Hypotension - likely sedation related Atrial Fibrillation - chronic, on coumadin - RVR 2/13 P:  Decrease cardizem & uptitrate metoprolol per cards 2/13 Cont hold home bisoprolol, lisinopril Continue ASA  Cont heparin gtt per pharmacy until coumadin is therapeutic, will start 2/16.  RENAL A:   Acute on Chronic Kidney Disease - improved.   Mild hypernatremia P:   F/u chem  Decrease lasix to 40 mg PO BID. Zaroxolyn 5 mg PO daily.  GASTROINTESTINAL A:   Morbid Obesity  P:   Advance PO  PPI Maalox prn  HEMATOLOGIC A:   Anemia - recent work up per primary MD in process, hx FOB +  P:  DVT Px: heparin gtt (previously on coumadin as outpatient) Trend CBC    INFECTIOUS A:   No active infection - pct neg  P:   Monitor off abx Trend fever curve / WBC   ENDOCRINE A:   DM Gout P:   SSI -add lantus 10 u daily with diet Hold home allopurinol   NEUROLOGIC A:   Acute Metabolic Encephalopathy -resolved P:   PT consult appreciated.  FAMILY  -  Update: patient updated bedside.  Rush Farmer, M.D. [facility] Pulmonary/Critical Care Medicine. Pager: [phone]. After hours pager: [phone].

## 2014-02-18 NOTE — Progress Notes (Signed)
Placed pt. On BIPAP per [facility] orders. BIPAP is set at 12/5, back up rate: 8 with 35% FIO2. Pt. Is tolerating BIPAP well at this time without any complications.

## 2014-02-18 NOTE — Progress Notes (Signed)
Physical Therapy Treatment Patient Details Name: William Flores MRN: [id number] DOB: 08-11-1950 Today's Date: 02/22/14    History of Present Illness Pt is a 64 y.o. male, known PMH diabetes, atrial fibrillation, sleep apnea on nocturnal CPAP, diastolic heart failure, likely right-sided heart failure and morbid obesity. Sent to the emergency department with shortness of breath progressive over one week. This was associated with a 10 pound weight gain and a sensation of abdominal fullness. He denies any progression in his baseline orthopnea. Evaluation in the ER revealed slight acute renal insufficiency on chronic kidney disease. 2/10 decompensated on floor with hypercarbic respiratory failure, to ICU for emergent intubation. Extubated 2/14.    PT Comments    Pt making good progress.  Follow Up Recommendations  Home health PT;Supervision - Intermittent     Equipment Recommendations  None recommended by PT    Recommendations for Other Services       Precautions / Restrictions Precautions Precautions: None Precaution Comments: Watch O2 sats Restrictions Weight Bearing Restrictions: No    Mobility  Bed Mobility                  Transfers Overall transfer level: Needs assistance Equipment used: Rolling walker (2 wheeled) Transfers: Sit to/from Stand Sit to Stand: Supervision            Ambulation/Gait Ambulation/Gait assistance: Supervision Ambulation Distance (Feet): 110 Feet Assistive device: Rolling walker (2 wheeled) Gait Pattern/deviations: Step-through pattern;Decreased stride length Gait velocity: Decreased Gait velocity interpretation: Below normal speed for age/gender General Gait Details: Dyspnea 2/4 on 3L.   Stairs            Wheelchair Mobility    Modified Rankin (Stroke Patients Only)       Balance     Sitting balance-Leahy Scale: Good       Standing balance-Leahy Scale: Fair                      Cognition  Arousal/Alertness: Awake/alert Behavior During Therapy: WFL for tasks assessed/performed Overall Cognitive Status: Within Functional Limits for tasks assessed                      Exercises      General Comments        Pertinent Vitals/Pain Pain Assessment: No/denies pain    Home Living                      Prior Function            PT Goals (current goals can now be found in the care plan section) Progress towards PT goals: Progressing toward goals    Frequency  Min 3X/week    PT Plan Discharge plan needs to be updated    Co-evaluation             End of Session Equipment Utilized During Treatment: Oxygen Activity Tolerance: Patient tolerated treatment well Patient left: in bed;with call bell/phone within reach     Time: 7793-9030 PT Time Calculation (min) (ACUTE ONLY): 13 min  Charges:  $Gait Training: 8-22 mins                    G Codes:      TRUE Shackleford Feb 22, 2014, 12:22 PM  [employer] PT [phone]

## 2014-02-18 NOTE — Telephone Encounter (Signed)
Directions in chart are different from directions on faxed request. Left message for patient to call back with how he is taking this.  Directions on fax say Furosemide 19m 1 tablet BID. Chart says 2 tablets in the morning and 1 in the evening.

## 2014-02-18 NOTE — Progress Notes (Signed)
NUTRITION FOLLOW UP  Intervention:   -Continue with current plan of care  Nutrition Dx:   Inadequate oral intake related to inability to eat as evidenced by NPO status; resolved  No nutrition dx at this time  Goal:   Pt will meet >90% of estimated nutritional needs  Monitor:   PO intake, labs, weight changes, I/O's  Assessment:   Pt hx of DM, A.fib, sleep apnea, CHF, morbid obesity. Pt came to ED with SOB associated with 10 lb wt gain and abdominal fullness.   Pt extubated on 02/16/14. Nutritional needs recalculated due to change in status.  Pt is currently on a heart healthy/carb modified diet. Intake is good; PO: 100%. Observed pt consuming lunch tray without difficulty.  Pt remains on diuretics- noted weight loss trend likely due to diuresis.  Labs reviewed. BUN: 47, Phos: 56, Glucose: 159. CBGS: 147-159.   Height: Ht Readings from Last 1 Encounters:  02/17/14 5' 7"  (1.702 m)    Weight Status:   Wt Readings from Last 1 Encounters:  02/18/14 277 lb 9 oz (125.9 kg)   02/12/14 289 lb 14.5 oz (131.5 kg)       Re-estimated needs:  Kcal: 2000-2200 Protein: 90-100 grams Fluid: 2.0-2.2 L  Skin: stage I pressure ulcer on sacrum  Diet Order: Diet heart healthy/carb modified   Intake/Output Summary (Last 24 hours) at 02/18/14 1327 Last data filed at 02/18/14 1200  Gross per 24 hour  Intake 1624.54 ml  Output    450 ml  Net 1174.54 ml    Last BM: 02/17/14   Labs:   Recent Labs Lab 02/15/14 0350 02/16/14 0400 02/17/14 0350 02/18/14 0600  NA 145 148* 147* 143  K 4.0 4.4 4.5 4.7  CL 104 108 105 103  CO2 30 35* 35* 30  BUN 46* 37* 41* 47*  CREATININE 1.21 1.15 1.18 1.35  CALCIUM 8.9 9.7 9.4 9.2  MG 2.2 2.2 2.3  --   PHOS 4.1 3.6 5.6*  --   GLUCOSE 161* 187* 225* 159*    CBG (last 3)   Recent Labs  02/17/14 2348 02/18/14 0358 02/18/14 0855  GLUCAP 159* 157* 147*    Scheduled Meds: . aspirin  81 mg Oral Daily  . [START ON 02/20/2014] digoxin   0.125 mg Oral Daily  . digoxin  0.25 mg Oral TID  . diltiazem  30 mg Oral 4 times per day  . docusate sodium  100 mg Oral BID  . etomidate  20 mg Intravenous Once  . furosemide  40 mg Oral BID  . insulin aspart  0-20 Units Subcutaneous 6 times per day  . insulin glargine  10 Units Subcutaneous Daily  . ketotifen  1 drop Both Eyes BID  . levalbuterol  0.63 mg Nebulization Q6H  . metolazone  5 mg Oral Daily  . metoprolol tartrate  75 mg Oral BID  . pantoprazole  40 mg Oral QHS  . polyethylene glycol  17 g Oral Daily  . potassium chloride  40 mEq Oral TID  . warfarin  12.5 mg Oral ONCE-1800  . Warfarin - Pharmacist Dosing Inpatient   Does not apply q1800    Continuous Infusions: . sodium chloride 10 mL/hr at 02/17/14 1020  . heparin 2,150 Units/hr (02/18/14 1226)    Mialynn Shelvin A. Jimmye Norman, RD, LDN, CDE Pager: [phone] After hours Pager: [phone]

## 2014-02-18 NOTE — Progress Notes (Addendum)
ANTICOAGULATION CONSULT NOTE - Follow Up Consult  Pharmacy Consult for Heparin + Coumadin Indication: atrial fibrillation  Allergies  Allergen Reactions  . Codeine     Headache     Patient Measurements: Height: 5' 7"  (170.2 cm) Weight: 277 lb 9 oz (125.9 kg) IBW/kg (Calculated) : 66.1 Heparin Dosing Weight: 91 kg  Vital Signs: Temp: 97.5 F (36.4 C) (02/16 0857) Temp Source: Oral (02/16 0857) BP: 112/87 mmHg (02/16 0857) Pulse Rate: 90 (02/16 0857)  Labs:  Recent Labs  02/16/14 0400  02/17/14 0350 02/17/14 2300 02/18/14 0600 02/18/14 0644  HGB 10.9*  --  11.1*  --   --   --   HCT 40.5  --  40.8  --   --   --   PLT 229  --  238  --   --   --   LABPROT 16.4*  --  16.7*  --  16.4*  --   INR 1.30  --  1.33  --  1.31  --   HEPARINUNFRC  --   < > 0.52 0.25* <0.10* 0.29*  CREATININE 1.15  --  1.18  --  1.35  --   < > = values in this interval not displayed.  Estimated Creatinine Clearance: 72.2 mL/min (by C-G formula based on Cr of 1.35).  Assessment: CC: 2/8 with dyspnea, abd distention, LE edema, 10lb wt gain: acute on chronic CHF  AC: Coumadin PTA for Afib, EF 35-40% (Coumadin 7.32m daily except 181mMon / Fri) >> IV heparin. Some bleeding so heparin held 6hr on 2/15. Heparin resumed with HL 0.29. No CBC done this AM.    Heparin level 0.3-0.7 units/ml Monitor platelets by anticoagulation protocol: Yes   Plan:  Increase IV heparin to 2150 units/hr Daily heparin level and CBC, INR Resume oral anticoagulation at Coumadin 12.55m64mo x 1 tonight.   Rylynn Schoneman S. RobAlford HighlandharmD, BCPS Clinical Staff Pharmacist Pager [phone] Ghaziillinger 02/18/2014,10:41 AM

## 2014-02-18 NOTE — Progress Notes (Signed)
[place] Progress Note Patient Name: William Flores DOB: Jun 12, 1950 MRN: [id number]   Date of Service  02/18/2014  HPI/Events of Note  Pain Anxiety, removing BIPAP mask  eICU Interventions  Fentanyl prn Ativan x1 dose     Intervention Category Intermediate Interventions: Pain - evaluation and management Minor Interventions: Agitation / anxiety - evaluation and management  MCQUAID, DOUGLAS 02/18/2014, 1:47 AM

## 2014-02-18 NOTE — Progress Notes (Signed)
Patient Name: William Flores Date of Encounter: 02/18/2014     Principal Problem:   Acute on chronic diastolic heart failure Active Problems:   HTN (hypertension)   DM type 2, uncontrolled, with renal complications   Hyperlipidemia   Morbid obesity   Anemia   OSA on CPAP   Acute respiratory failure with hypoxia   Chronic atrial fibrillation   Right heart failure   Acute respiratory failure    SUBJECTIVE No CP or SOB. Hoarse from extubation. Talking about CO poisoning.  CURRENT MEDS . aspirin  81 mg Oral Daily  . diltiazem  60 mg Oral 4 times per day  . docusate sodium  100 mg Oral BID  . etomidate  20 mg Intravenous Once  . furosemide  40 mg Oral BID  . insulin aspart  0-20 Units Subcutaneous 6 times per day  . insulin glargine  10 Units Subcutaneous Daily  . ketotifen  1 drop Both Eyes BID  . levalbuterol  0.63 mg Nebulization Q6H  . metolazone  5 mg Oral Daily  . metoprolol tartrate  75 mg Oral BID  . pantoprazole  40 mg Oral QHS  . polyethylene glycol  17 g Oral Daily  . potassium chloride  40 mEq Oral TID    OBJECTIVE  Filed Vitals:   02/18/14 0600 02/18/14 0700 02/18/14 0800 02/18/14 0857  BP:    112/87  Pulse: 104 104 94 90  Temp:   97.8 F (36.6 C) 97.5 F (36.4 C)  TempSrc:   Oral Oral  Resp: 19 19 13 16   Height:      Weight:      SpO2: 95% 93% 97% 100%    Intake/Output Summary (Last 24 hours) at 02/18/14 1151 Last data filed at 02/18/14 0700  Gross per 24 hour  Intake 1644.54 ml  Output      0 ml  Net 1644.54 ml   Filed Weights   02/17/14 0350 02/17/14 1018 02/18/14 0500  Weight: 267 lb 13.7 oz (121.5 kg) 174 lb 3.2 oz (79.017 kg) 277 lb 9 oz (125.9 kg)    PHYSICAL EXAM  General: Pleasant, NAD. Chronically ill appearing. obese Neuro: Alert and oriented X 3. Moves all extremities spontaneously. Psych: Normal affect. HEENT:  Normal  Neck: Supple without bruits or JVD. Lungs:  Resp regular and unlabored, CTA. Heart: irreg irreg no  s3, s4, or murmurs. Abdomen: Soft, non-tender, non-distended, BS + x 4.   Extremities: No clubbing, cyanosis. DP/PT/Radials 2+ and equal bilaterally. Trace edema  Accessory Clinical Findings  CBC  Recent Labs  02/16/14 0400 02/17/14 0350  WBC 8.2 7.3  HGB 10.9* 11.1*  HCT 40.5 40.8  MCV 84.4 85.7  PLT 229 397   Basic Metabolic Panel  Recent Labs  02/16/14 0400 02/17/14 0350 02/18/14 0600  NA 148* 147* 143  K 4.4 4.5 4.7  CL 108 105 103  CO2 35* 35* 30  GLUCOSE 187* 225* 159*  BUN 37* 41* 47*  CREATININE 1.15 1.18 1.35  CALCIUM 9.7 9.4 9.2  MG 2.2 2.3  --   PHOS 3.6 5.6*  --     TELE  afib with CVR  Radiology/Studies  Dg Chest 2 View  02/10/2014   CLINICAL DATA:  Shortness of breath and decreased oxygen saturations.  EXAM: CHEST  2 VIEW  COMPARISON:  10/28/2013 and 08/05/2013  FINDINGS: Chronic pleural thickening along both sides of the chest. Heart size is upper limits of normal but unchanged. The trachea is  midline. Concern for small pleural effusions on the lateral view. Prominent lung markings suggest vascular congestion or mild edema. Trachea is midline.  IMPRESSION: Vascular congestion or mild interstitial edema.  Concern for small pleural effusions.   Electronically Signed   By: Markus Daft M.D.   On: 02/10/2014 14:16   Dg Chest 2 View  02/10/2014   CLINICAL DATA:  Cough and shortness of breath for 2 months.  EXAM: CHEST  2 VIEW  COMPARISON:  PA and lateral chest 08/05/2013, 02/20/2006 and 02/10/2014.  FINDINGS: Small chronic bilateral pleural effusions versus pleural thickening along the chest walls appear unchanged. There is cardiomegaly. Heart size is upper normal. No pneumothorax or pleural effusion.  IMPRESSION: No acute disease.   Electronically Signed   By: Inge Rise M.D.   On: 02/10/2014 13:49   Dg Chest Portable 1 View  02/16/2014   CLINICAL DATA:  Respiratory failure.  EXAM: PORTABLE CHEST - 1 VIEW  COMPARISON:  02/15/2014  FINDINGS: Endotracheal  tube is in place with tip 5.8 cm above carina. Nasogastric tube is in place with tip be on the film but be on the gastroesophageal junction. Left IJ central line tip overlies the superior vena cava.  The heart is enlarged. There is patchy density at both lung bases consistent with atelectasis or infiltrate. Suspect bilateral small effusions.  IMPRESSION: 1. Lines and tubes as described. 2. Persistent bibasilar atelectasis or infiltrates.   Electronically Signed   By: Nolon Nations M.D.   On: 02/16/2014 08:31   Dg Chest Port 1 View  02/15/2014   CLINICAL DATA:  Respiratory failure  EXAM: PORTABLE CHEST - 1 VIEW  COMPARISON:  Chest x-ray from 2 days prior.  FINDINGS: Endotracheal tube is in stable position, tip at the clavicular heads. Left IJ catheter, tip near the distal left brachiocephalic vein. The gastric suction tube is in good position.  Improved aeration, likely resolved pulmonary edema. There are bilateral pleural effusions, at least moderate on the right with layering. Focal right basilar opacity. No air leak.  Stable cardiomegaly and upper mediastinal widening.  IMPRESSION: 1. Resolved pulmonary edema. Right more than left layering pleural effusions persist. 2. Focal opacity at the right base which could reflect pneumonia or atelectasis. 3. Stable positioning of tubes and central line.   Electronically Signed   By: Monte Fantasia M.D.   On: 02/15/2014 08:14   Portable Chest Xray  02/13/2014   CLINICAL DATA:  Sleep apnea.  Shortness of breath.  EXAM: PORTABLE CHEST - 1 VIEW  COMPARISON:  02/12/2014.  FINDINGS: Endotracheal tube, left IJ line, NG tube in stable position. Cardiomegaly with bilateral pulmonary alveolar infiltrates. Bilateral effusions. No pneumothorax. No acute bony abnormality.  IMPRESSION: 1. Lines and tubes in stable position. 2. Congestive heart failure with bilateral pulmonary edema increased from prior exams. Bilateral small pleural effusions.   Electronically Signed   By:  Marcello Moores  Register   On: 02/13/2014 07:31   Dg Chest Port 1 View  02/12/2014   CLINICAL DATA:  64 year old male currently admitted. Evaluate central line placement.  EXAM: PORTABLE CHEST - 1 VIEW  COMPARISON:  Prior chest x-ray 02/12/2014  FINDINGS: New left IJ approach central venous catheter. Catheter tip overlies the junction of the left innominate vein and the superior vena cava. Limited evaluation of the left hemi thorax. No pneumothorax. Bilateral pleural effusions, pulmonary edema and dense left retrocardiac opacity are all similar.  IMPRESSION: 1. The tip of the new left IJ approach central venous catheter  overlies the junction of the left innominate vein and the SVC. 2. No definite pneumothorax although evaluation of the left chest is very limited given patient positioning.   Electronically Signed   By: Jacqulynn Cadet M.D.   On: 02/12/2014 10:33   Portable Chest Xray  02/12/2014   CLINICAL DATA:  Obstructive sleep apnea  EXAM: PORTABLE CHEST - 1 VIEW  COMPARISON:  02/10/2014  FINDINGS: Low volumes. Endotracheal tube in place with its tip 5.8 cm from the carina. Multi focal bilateral patchy airspace opacities. Small right pleural effusion.  IMPRESSION: Endotracheal tube 5.8 cm from the carina  CHF.   Electronically Signed   By: Marybelle Killings M.D.   On: 02/12/2014 09:31   Dg Abd Portable 1v  02/12/2014   CLINICAL DATA:  Orogastric tube placement  EXAM: PORTABLE ABDOMEN - 1 VIEW  COMPARISON:  10/28/2013  FINDINGS: An orogastric tube is coiled in the stomach. No incidental acute findings, the bowel gas pattern is nonobstructive.  IMPRESSION: Orogastric tube tip in the upper stomach.   Electronically Signed   By: Monte Fantasia M.D.   On: 02/12/2014 12:52   Last Echo  02/12/14:  - Left ventricle: The cavity size was normal. There was moderate concentric hypertrophy. Systolic function was moderately reduced. The estimated ejection fraction was in the range of 35% to 40%. Diffuse  hypokinesis. - Left atrium: The atrium was mildly dilated. - Right ventricle: Systolic function was mildly reduced. - Right atrium: The atrium was normal in size. - Tricuspid valve: Structurally normal valve. There was mild regurgitation. - Pulmonary arteries: Systolic pressure was within the normal range. - Inferior vena cava: The vessel was dilated. The respirophasic diameter changes were blunted (< 50%), consistent with elevated central venous pressure. - Pericardium, extracardiac: There was no pericardial effusion    ASSESSMENT AND PLAN  64 y/o M with a PMH of Afib, HLD, DM, OSA on CPAP, dCHF who presented 2/8 from [facility] with SOB, weight gain, non productive cough and orthopnea. Hypoxic on presentation with sats 70's. Initially admitted by [facility] with decompensated CHF and AKI. Attempted diuresis and bipap, but pt refused bipap. On 2/10, he decompensated with worsening of hypercarbic respiratory failure and was emergently transferred to ICU for intubation. Course was further complicated by afib with RVR and 2D ECHO revealed newly reduced EF and cardiology was consulted.   2/08 Admit with AKI / decompensated CHF 2/10 Decompensated on floor with hypercarbic respiratory failure, to ICU for emergent intubation.  02/13/14 - on levophed, heparin gtt and fent gtt with versed prn. Creat better. On vent. ECHO with LVEF 35%, SCVO2 77%  2/13 cards consult 2/14 RVR, extubated   AFib: Rate controlled on current dose of metoprolol 67m BID and diltiazem 60 q6. Had been titrating his diltiazem down because of his systolic dysfunction. Rate controlled in low 100s.  -- Continue heparin. Was previously on coumadin and will require long term AC. CHADSVASc at least 3 (CHF 1, HTN 1, DM 1).   New cardiomyopathy: EF was 25% in 2008 with clean cath at that time. Normal EF in 08/2013. It was thought he had AFib with RVR in recent months and now EF 35-40%. -- Continue medical  therapy. Repeat echo in 6 weeks. If EF does not improve, could consider cath at that time. If he has CP sx, could consider cath.  -- Continue BB and consider adding low dose ACE as creat improved. Was previously on lisinopril 277mat home   AKI-  creat improved. 2.79 (02/12/14) --> 1.3 today.   Judy Pimple PA-C  Pager [phone]

## 2014-02-19 ENCOUNTER — Inpatient Hospital Stay (HOSPITAL_COMMUNITY): Payer: BLUE CROSS/BLUE SHIELD

## 2014-02-19 DIAGNOSIS — I5023 Acute on chronic systolic (congestive) heart failure: Secondary | ICD-10-CM

## 2014-02-19 DIAGNOSIS — G4733 Obstructive sleep apnea (adult) (pediatric): Secondary | ICD-10-CM | POA: Insufficient documentation

## 2014-02-19 DIAGNOSIS — Z01818 Encounter for other preprocedural examination: Secondary | ICD-10-CM | POA: Insufficient documentation

## 2014-02-19 LAB — BLOOD GAS, ARTERIAL
ACID-BASE EXCESS: 2 mmol/L (ref 0.0–2.0)
Acid-Base Excess: 3.5 mmol/L — ABNORMAL HIGH (ref 0.0–2.0)
Bicarbonate: 28 mEq/L — ABNORMAL HIGH (ref 20.0–24.0)
Bicarbonate: 28.2 mEq/L — ABNORMAL HIGH (ref 20.0–24.0)
DRAWN BY: 31101
Drawn by: 31101
FIO2: 1 %
FIO2: 0.4 %
O2 SAT: 99.9 %
O2 SAT: 91.2 %
PATIENT TEMPERATURE: 98.6
PEEP/CPAP: 5 cmH2O
PEEP: 5 cmH2O
PH ART: 7.386 (ref 7.350–7.450)
Patient temperature: 98.6
RATE: 15 resp/min
RATE: 15 resp/min
TCO2: 29.9 mmol/L (ref 0–100)
TCO2: 29.7 mmol/L (ref 0–100)
VT: 510 mL
VT: 510 mL
pCO2 arterial: 60.6 mmHg (ref 35.0–45.0)
pCO2 arterial: 48 mmHg — ABNORMAL HIGH (ref 35.0–45.0)
pH, Arterial: 7.287 — ABNORMAL LOW (ref 7.350–7.450)
pO2, Arterial: 373 mmHg — ABNORMAL HIGH (ref 80.0–100.0)
pO2, Arterial: 65.2 mmHg — ABNORMAL LOW (ref 80.0–100.0)

## 2014-02-19 LAB — BASIC METABOLIC PANEL
ANION GAP: 4 — AB (ref 5–15)
Anion gap: 3 — ABNORMAL LOW (ref 5–15)
BUN: 44 mg/dL — ABNORMAL HIGH (ref 6–23)
BUN: 48 mg/dL — ABNORMAL HIGH (ref 6–23)
CALCIUM: 9 mg/dL (ref 8.4–10.5)
CALCIUM: 8.9 mg/dL (ref 8.4–10.5)
CO2: 32 mmol/L (ref 19–32)
CO2: 32 mmol/L (ref 19–32)
CREATININE: 1.43 mg/dL — AB (ref 0.50–1.35)
Chloride: 101 mmol/L (ref 96–112)
Chloride: 104 mmol/L (ref 96–112)
Creatinine, Ser: 1.24 mg/dL (ref 0.50–1.35)
GFR calc Af Amer: 70 mL/min — ABNORMAL LOW (ref >90)
GFR, EST AFRICAN AMERICAN: 59 mL/min — AB (ref >90)
GFR, EST NON AFRICAN AMERICAN: 51 mL/min — AB (ref >90)
GFR, EST NON AFRICAN AMERICAN: 61 mL/min — AB (ref >90)
GLUCOSE: 168 mg/dL — AB (ref 70–99)
Glucose, Bld: 256 mg/dL — ABNORMAL HIGH (ref 70–99)
Potassium: 5.2 mmol/L — ABNORMAL HIGH (ref 3.5–5.1)
Potassium: 4.5 mmol/L (ref 3.5–5.1)
Sodium: 137 mmol/L (ref 135–145)
Sodium: 139 mmol/L (ref 135–145)

## 2014-02-19 LAB — CBC
HCT: 35.9 % — ABNORMAL LOW (ref 39.0–52.0)
Hemoglobin: 9.8 g/dL — ABNORMAL LOW (ref 13.0–17.0)
MCH: 22.5 pg — ABNORMAL LOW (ref 26.0–34.0)
MCHC: 27.3 g/dL — AB (ref 30.0–36.0)
MCV: 82.5 fL (ref 78.0–100.0)
Platelets: 238 10*3/uL (ref 150–400)
RBC: 4.35 MIL/uL (ref 4.22–5.81)
RDW: 21.5 % — ABNORMAL HIGH (ref 11.5–15.5)
WBC: 8.7 10*3/uL (ref 4.0–10.5)

## 2014-02-19 LAB — TRIGLYCERIDES: TRIGLYCERIDES: 130 mg/dL (ref <150)

## 2014-02-19 LAB — GLUCOSE, CAPILLARY
GLUCOSE-CAPILLARY: 182 mg/dL — AB (ref 70–99)
Glucose-Capillary: 112 mg/dL — ABNORMAL HIGH (ref 70–99)
Glucose-Capillary: 161 mg/dL — ABNORMAL HIGH (ref 70–99)
Glucose-Capillary: 181 mg/dL — ABNORMAL HIGH (ref 70–99)
Glucose-Capillary: 194 mg/dL — ABNORMAL HIGH (ref 70–99)
Glucose-Capillary: 233 mg/dL — ABNORMAL HIGH (ref 70–99)

## 2014-02-19 LAB — PROTIME-INR
INR: 1.24 (ref 0.00–1.49)
PROTHROMBIN TIME: 15.8 s — AB (ref 11.6–15.2)

## 2014-02-19 LAB — HEPARIN LEVEL (UNFRACTIONATED): Heparin Unfractionated: 0.4 IU/mL (ref 0.30–0.70)

## 2014-02-19 LAB — PHOSPHORUS: Phosphorus: 4.7 mg/dL — ABNORMAL HIGH (ref 2.3–4.6)

## 2014-02-19 LAB — MAGNESIUM: Magnesium: 2.1 mg/dL (ref 1.5–2.5)

## 2014-02-19 MED ORDER — FENTANYL CITRATE 0.05 MG/ML IJ SOLN
100.0000 ug | INTRAMUSCULAR | Status: DC | PRN
  Administered 2014-02-19 – 2014-02-20 (×4): 100 ug via INTRAVENOUS
  Filled 2014-02-19 (×4): qty 2

## 2014-02-19 MED ORDER — WARFARIN SODIUM 10 MG PO TABS
10.0000 mg | ORAL_TABLET | Freq: Once | ORAL | Status: DC
Start: 2014-02-19 — End: 2014-02-19
  Filled 2014-02-19: qty 1

## 2014-02-19 MED ORDER — VITAL HIGH PROTEIN PO LIQD
1000.0000 mL | ORAL | Status: DC
  Administered 2014-02-19 – 2014-02-20 (×2): 1000 mL
  Filled 2014-02-19 (×5): qty 1000

## 2014-02-19 MED ORDER — PRO-STAT SUGAR FREE PO LIQD
60.0000 mL | Freq: Three times a day (TID) | ORAL | Status: DC
  Administered 2014-02-19 – 2014-02-21 (×7): 60 mL
  Filled 2014-02-19 (×9): qty 60

## 2014-02-19 MED ORDER — CHLORHEXIDINE GLUCONATE 0.12 % MT SOLN
15.0000 mL | Freq: Two times a day (BID) | OROMUCOSAL | Status: DC
  Administered 2014-02-19 – 2014-02-22 (×8): 15 mL via OROMUCOSAL
  Filled 2014-02-19 (×9): qty 15

## 2014-02-19 MED ORDER — ROCURONIUM BROMIDE 50 MG/5ML IV SOLN
50.0000 mg | Freq: Once | INTRAVENOUS | Status: DC
  Filled 2014-02-19: qty 5

## 2014-02-19 MED ORDER — METHYLPREDNISOLONE SODIUM SUCC 125 MG IJ SOLR
60.0000 mg | Freq: Four times a day (QID) | INTRAMUSCULAR | Status: AC
  Administered 2014-02-19 (×4): 60 mg via INTRAVENOUS
  Filled 2014-02-19 (×3): qty 0.96
  Filled 2014-02-19: qty 2
  Filled 2014-02-19: qty 0.96
  Filled 2014-02-19: qty 2

## 2014-02-19 MED ORDER — DIGOXIN 250 MCG PO TABS
0.2500 mg | ORAL_TABLET | Freq: Three times a day (TID) | ORAL | Status: AC
  Administered 2014-02-19: 0.25 mg
  Filled 2014-02-19: qty 1

## 2014-02-19 MED ORDER — MIDAZOLAM HCL 2 MG/2ML IJ SOLN
2.0000 mg | Freq: Once | INTRAMUSCULAR | Status: AC
  Administered 2014-02-19: 2 mg via INTRAVENOUS

## 2014-02-19 MED ORDER — DIGOXIN 125 MCG PO TABS
0.1250 mg | ORAL_TABLET | Freq: Every day | ORAL | Status: DC
  Administered 2014-02-20 – 2014-02-26 (×7): 0.125 mg
  Filled 2014-02-19 (×8): qty 1

## 2014-02-19 MED ORDER — POTASSIUM CHLORIDE 10 MEQ/50ML IV SOLN
10.0000 meq | Freq: Two times a day (BID) | INTRAVENOUS | Status: DC
  Administered 2014-02-19: 10 meq via INTRAVENOUS
  Filled 2014-02-19: qty 50

## 2014-02-19 MED ORDER — METOPROLOL TARTRATE 50 MG PO TABS
75.0000 mg | ORAL_TABLET | Freq: Two times a day (BID) | ORAL | Status: DC
  Administered 2014-02-19 – 2014-02-21 (×5): 75 mg
  Filled 2014-02-19 (×6): qty 1

## 2014-02-19 MED ORDER — PANTOPRAZOLE SODIUM 40 MG IV SOLR
40.0000 mg | Freq: Every day | INTRAVENOUS | Status: DC
  Administered 2014-02-19: 40 mg via INTRAVENOUS
  Filled 2014-02-19 (×2): qty 40

## 2014-02-19 MED ORDER — CETYLPYRIDINIUM CHLORIDE 0.05 % MT LIQD
7.0000 mL | Freq: Four times a day (QID) | OROMUCOSAL | Status: DC
Start: 2014-02-19 — End: 2014-02-22
  Administered 2014-02-19 – 2014-02-22 (×15): 7 mL via OROMUCOSAL

## 2014-02-19 MED ORDER — FUROSEMIDE 10 MG/ML IJ SOLN
40.0000 mg | Freq: Two times a day (BID) | INTRAMUSCULAR | Status: DC
  Administered 2014-02-20 – 2014-02-23 (×7): 40 mg via INTRAVENOUS
  Filled 2014-02-19 (×10): qty 4

## 2014-02-19 MED ORDER — PROPOFOL 10 MG/ML IV EMUL
0.0000 ug/kg/min | INTRAVENOUS | Status: DC
  Administered 2014-02-19: 20 ug/kg/min via INTRAVENOUS
  Administered 2014-02-19: 25 ug/kg/min via INTRAVENOUS
  Administered 2014-02-19: 50 ug/kg/min via INTRAVENOUS
  Administered 2014-02-19: 45 ug/kg/min via INTRAVENOUS
  Administered 2014-02-19 (×2): 35 ug/kg/min via INTRAVENOUS
  Administered 2014-02-19: 50 ug/kg/min via INTRAVENOUS
  Administered 2014-02-20: 30 ug/kg/min via INTRAVENOUS
  Administered 2014-02-20 (×2): 50 ug/kg/min via INTRAVENOUS
  Filled 2014-02-19: qty 100
  Filled 2014-02-19 (×2): qty 200
  Filled 2014-02-19 (×7): qty 100

## 2014-02-19 MED ORDER — LEVALBUTEROL HCL 0.63 MG/3ML IN NEBU
0.6300 mg | INHALATION_SOLUTION | RESPIRATORY_TRACT | Status: DC | PRN
  Administered 2014-02-21 – 2014-02-23 (×7): 0.63 mg via RESPIRATORY_TRACT
  Filled 2014-02-19 (×7): qty 3

## 2014-02-19 MED ORDER — FENTANYL CITRATE 0.05 MG/ML IJ SOLN
100.0000 ug | Freq: Once | INTRAMUSCULAR | Status: AC
  Administered 2014-02-19: 100 ug via INTRAVENOUS

## 2014-02-19 MED ORDER — ASPIRIN 81 MG PO CHEW
81.0000 mg | CHEWABLE_TABLET | Freq: Every day | ORAL | Status: DC
  Administered 2014-02-19 – 2014-02-26 (×8): 81 mg
  Filled 2014-02-19 (×8): qty 1

## 2014-02-19 MED ORDER — ETOMIDATE 2 MG/ML IV SOLN
0.3000 mg/kg | Freq: Once | INTRAVENOUS | Status: DC
Start: 2014-02-19 — End: 2014-02-20
  Filled 2014-02-19: qty 18.89

## 2014-02-19 NOTE — Progress Notes (Signed)
Pt was transferred to MICU and report was given to [place], [place].

## 2014-02-19 NOTE — Progress Notes (Signed)
Pt was found at the side of bed with BiPAP off. Stated that the pressure was too much for him.

## 2014-02-19 NOTE — Clinical Social Work Note (Signed)
Clinical Social Worker has attempted to contact pt's son Brenner Visconti and left a message in reference to making patient's medical decisions. CSW advised pt's son to contact this CSW and or [facility] ICU. CSW currently awaiting a returned phone call from pt's son. RN notified.   Glendon Axe, MSW, LCSWA [phone] 02/19/2014 2:56 PM

## 2014-02-19 NOTE — Progress Notes (Signed)
eLink Physician-Brief Progress Note Patient Name: William Flores DOB: February 25, 1950 MRN: [id number]   Date of Service  02/19/2014  HPI/Events of Note  Respiratory failure, some thick secretions Receiving diuresis Post intubation ABG> mild respiratory acidosis, likely still catching up from pre-intubation hypercarbia  eICU Interventions  resp culture Foley Maintain current vent strategy, repeat ABG later this morning      Intervention Category Major Interventions: Respiratory failure - evaluation and management Intermediate Interventions: Diagnostic test evaluation Minor Interventions: Routine modifications to care plan (e.g. PRN medications for pain, fever)  MCQUAID, DOUGLAS 02/19/2014, 1:22 AM

## 2014-02-19 NOTE — Progress Notes (Signed)
PULMONARY / CRITICAL CARE MEDICINE   Name: William Flores MRN: [id number] DOB: [birth year]    ADMISSION DATE:  02/10/2014 CONSULTATION DATE:  02/12/14  REFERRING MD :  Dr. Candiss Norse   CHIEF COMPLAINT:  Acute Respiratory Failure / AMS   INITIAL PRESENTATION:   64 y/o M with a PMH of Afib, HLD, DM, OSA on CPAP, dCHF who presented 2/8 from [place] with SOB, weight gain, non productive cough and orthopnea. Hypoxic on presentation with sats 70's.  Initially admitted by [facility] with decompensated CHF and AKI.  Attempted diuresis and bipap, but pt refused bipap.  On 2/10, he decompensated with worsening of hypercarbic respiratory failure and was emergently transferred to ICU for intubation.  Extubated 2/14. Re-intubated 2/16.   SIGNIFICANT EVENTS: 2/08  Admit with AKI / decompensated CHF 2/10  Decompensated on floor with hypercarbic respiratory failure, to ICU for emergent intubation.  02/13/14 - on levophed, heparin gtt and fent gtt with versed prn. Creat better. On vent. ECHO with LVEF 35%, SCVO2 77%  2/13 cards consult 2/14 RVR, extubated 2/16 Re-intubated for respiratory distress, inability to tolerate BiPAP.  STUDIES:  2D echo 2/10>>> EF 35-40%,  Diffuse hypokinesis, mild TR, normal PASP    SUBJECTIVE/OVERNIGHT/INTERVAL HX [facility] MD called by RT overnight for tachypnea, stridor.  On my arrival to bedside, pt diaphoretic, tachypneic, in respiratory distress.  Loud stridor, pt unable to answer questions.    VITAL SIGNS: Temp:  [97.5 F (36.4 C)-98.7 F (37.1 C)] 98.7 F (37.1 C) (02/16 2036) Pulse Rate:  [73-128] 115 (02/16 2230) Resp:  [13-93] 32 (02/16 2230) BP: (97-132)/(62-116) 126/62 mmHg (02/16 2230) SpO2:  [82 %-100 %] 100 % (02/16 2230) FiO2 (%):  [35 %] 35 % (02/16 2230) Weight:  [125.9 kg (277 lb 9 oz)] 125.9 kg (277 lb 9 oz) (02/16 0500)   HEMODYNAMICS:     VENTILATOR SETTINGS: Vent Mode:  [-]  FiO2 (%):  [35 %] 35 %   INTAKE / OUTPUT:  Intake/Output  Summary (Last 24 hours) at 02/19/14 0015 Last data filed at 02/18/14 2300  Gross per 24 hour  Intake 1218.59 ml  Output   2700 ml  Net -1481.41 ml    PHYSICAL EXAMINATION: General:  Morbidly obese male, in respiratory distress. Neuro:  Awake, MAE's, unable to answer questions. HEENT:  [place]/AT, PERRL, EOM-I and MMM, hoarse. Cardiovascular:  IRIR, tachy, no M/R/G. Lungs: Resps shallow and rapid, labored. Loud stridor.  BS diminished. Abdomen:  Obese, soft, bsx4 active  Musculoskeletal:  No acute deformities Skin:  Warm, diaphoretic, 1+ BLE pitting edema  LABS: PULMONARY  Recent Labs Lab 02/12/14 0820 02/12/14 1140 02/12/14 1304  PHART 7.071*  --  7.451*  PCO2ART CRITICAL RESULT CALLED TO, READ BACK BY AND VERIFIED WITH:  --  46.8*  PO2ART 90.3  --  204.0*  HCO3 33.8*  --  32.6*  TCO2 37.6  --  34  O2SAT 91.4 77.3 100.0   CBC  Recent Labs Lab 02/15/14 0350 02/16/14 0400 02/17/14 0350  HGB 9.9* 10.9* 11.1*  HCT 36.4* 40.5 40.8  WBC 5.8 8.2 7.3  PLT 237 229 238   COAGULATION  Recent Labs Lab 02/14/14 0420 02/15/14 0350 02/16/14 0400 02/17/14 0350 02/18/14 0600  INR 1.60* 1.36 1.30 1.33 1.31   CARDIAC  Recent Labs Lab 02/12/14 1010 02/12/14 1610 02/12/14 2155  TROPONINI <0.03 <0.03 <0.03   No results for input(s): PROBNP in the last 168 hours.  CHEMISTRY  Recent Labs Lab 02/14/14 1231 02/14/14  2300 02/15/14 0350 02/16/14 0400 02/17/14 0350 02/18/14 0600  NA  --  144 145 148* 147* 143  K  --  4.4 4.0 4.4 4.5 4.7  CL  --  103 104 108 105 103  CO2  --  36* 30 35* 35* 30  GLUCOSE  --  186* 161* 187* 225* 159*  BUN  --  47* 46* 37* 41* 47*  CREATININE  --  1.28 1.21 1.15 1.18 1.35  CALCIUM  --  9.1 8.9 9.7 9.4 9.2  MG 2.3 2.3 2.2 2.2 2.3  --   PHOS 3.8 4.6 4.1 3.6 5.6*  --    Estimated Creatinine Clearance: 72.2 mL/min (by C-G formula based on Cr of 1.35).  LIVER  Recent Labs Lab 02/14/14 0420 02/15/14 0350 02/16/14 0400  02/17/14 0350 02/18/14 0600  INR 1.60* 1.36 1.30 1.33 1.31   INFECTIOUS  Recent Labs Lab 02/13/14 1112 02/14/14 0050 02/14/14 0420 02/15/14 0350  LATICACIDVEN  --  0.9  --   --   PROCALCITON 0.14  --  0.15 <0.10   ENDOCRINE CBG (last 3)   Recent Labs  02/18/14 1310 02/18/14 1646 02/18/14 2036  GLUCAP 169* 139* 147*   IMAGING x48h No results found.  ASSESSMENT / PLAN:  PULMONARY OETT 2/10 >> 2/14 >> 2/16 A: Acute Hypercarbic Respiratory Failure - in the setting of decompensated dCHF, AKI, untreated OSA OSA - suspect non-compliant with CPAP Pulmonary edema Pleural effusions - resolved Former Smoker  P:   Emergent intubation now. Full vent support, wean as able. VAP bundle. SBT in AM if able. Solumedrol 12m q6hrs, Xopenex q2hrs PRN. ABG / CXR in AM. Will need to coordinate a new CPAP machine upon discharge, evidently one at home does not work any longer.  CARDIOVASCULAR CVL L IJ TLC 2/10 >>  A:  Acute on chronic systolic CHF Atrial Fibrillation - chronic, on coumadin - RVR 2/13 New cardiomyopathy - EF now 35-40% HTN P:  Cards following. Continue metoprolol, digoxin, ASA. D/c cardizem. Continue heparin gtt per pharmacy. Hold outpatient bisoprolol, lisinopril, metolazone.  RENAL A:   Acute on Chronic Kidney Disease - improved P:   Continue lasix 469mBID. BMP in AM.  GASTROINTESTINAL A:   Morbid Obesity GI prophylaxis Nutrition P:   SUP: Pantoprazole. NPO.  HEMATOLOGIC A:   Anemia - recent work up per primary MD in process, hx FOB +  VTE prophylaxis P:  Transfuse per usual ICU guidelines. Heparin gtt (previously on coumadin as outpatient). CBC in AM.  INFECTIOUS A:   No indication of infection P:   Monitor clinically.  ENDOCRINE A:   DM Gout P:   SSI. D/c lantus given now NPO. Hold outpatient allopurinol.  NEUROLOGIC A:   Acute Metabolic Encephalopathy P:   Sedation:  Propofol gtt / Fentanyl PRN. RASS goal: 0 to  -1. Daily WUA.   FAMILY UPDATES:  None   RaMontey HoraPALandfallulmonary & Critical Care Medicine Pgr: ([phone] ([phone]/17/2016, 12:28 AM  Attending  I have seen and examined the patient with nurse practitioner/resident and agree with the note above.   We were called to beside for respiratory distress, ? Stridor, required emergent intubation On intubation we saw no upper airway abnormality, he was easily intubated.  Not sure if stridor was just from OSA/sedation.  He was clearly in respiratory distress from pulmonary edema.  Impression/Plan Acute respiratory failure with hypoxemia in setting of acute pulmonary  edema from acute decompsensated diastolic heart failure> diurese, full vent support Afib RVR> better now post intubation, cont dilt gtt Sedation needs> propofol  Additional CC time tonight 40 minutes  Roselie Awkward, MD [facility] Pager: [phone] Cell: [phone] If no response, call [phone]

## 2014-02-19 NOTE — Progress Notes (Signed)
UR Completed.  [phone]  

## 2014-02-19 NOTE — Clinical Social Work Note (Signed)
Clinical Social Worker spoke with patient's son, Clayburn Weekly in reference to formalizing POA/next to kin for medical decision making. Pt's son reported he will be the one making medical decisions in the event pt cannot make his own. Pt's son stated: "I mean we don't have the best relationship but that's still my dad". Pt's son indicated he is the only biological child and plans to make all medical decisions on pt's behalf. Pt's son further reported he lives in the [place] area, however he works out of town which limits his visitation.   Pt's son stated he is available by phone [phone] and is expecting a phone call from MD within the next 24 hrs.   RNCM notified. CSW available as needed.   Glendon Axe, MSW, LCSWA [phone] 02/19/2014 3:50 PM

## 2014-02-19 NOTE — Progress Notes (Signed)
PULMONARY / CRITICAL CARE MEDICINE   Name: William Flores MRN: [id number] DOB: September 24, 1950    ADMISSION DATE:  02/10/2014 CONSULTATION DATE:  02/12/14  REFERRING MD :  Dr. Candiss Norse   CHIEF COMPLAINT:  Acute Respiratory Failure / AMS   INITIAL PRESENTATION:   64 y/o M with a PMH of Afib, HLD, DM, OSA on CPAP, dCHF who presented 2/8 from [place] with SOB, weight gain, non productive cough and orthopnea. Hypoxic on presentation with sats 70's.  Initially admitted by [facility] with decompensated CHF and AKI.  Attempted diuresis and bipap, but pt refused bipap.  On 2/10, he decompensated with worsening of hypercarbic respiratory failure and was emergently transferred to ICU for intubation.  Extubated 2/14. Re-intubated 2/16.   SIGNIFICANT EVENTS: 2/08  Admit with AKI / decompensated CHF 2/10  Decompensated on floor with hypercarbic respiratory failure, to ICU for emergent intubation.  02/13/14 - on levophed, heparin gtt and fent gtt with versed prn. Creat better. On vent. ECHO with LVEF 35%, SCVO2 77%  2/13 cards consult 2/14 RVR, extubated 2/16 Re-intubated for respiratory distress, inability to tolerate BiPAP.  STUDIES:  2D echo 2/10>>> EF 35-40%,  Diffuse hypokinesis, mild TR, normal PASP    SUBJECTIVE/OVERNIGHT/INTERVAL HX Patient intubated overnight with hypercarbic respiratory failure after O2 desats while on BiPAP CXR with pleural effusion  VITAL SIGNS: Temp:  [97.5 F (36.4 C)-98.7 F (37.1 C)] 98.5 F (36.9 C) (02/17 0755) Pulse Rate:  [40-128] 79 (02/17 0800) Resp:  [14-32] 15 (02/17 0800) BP: (97-169)/(62-133) 115/75 mmHg (02/17 0800) SpO2:  [82 %-100 %] 96 % (02/17 0800) FiO2 (%):  [35 %-100 %] 50 % (02/17 0800) Weight:  [273 lb 9.5 oz (124.1 kg)] 273 lb 9.5 oz (124.1 kg) (02/17 0446)   HEMODYNAMICS:     VENTILATOR SETTINGS: Vent Mode:  [-] PRVC FiO2 (%):  [35 %-100 %] 50 % Set Rate:  [15 bmp] 15 bmp Vt Set:  [510 mL] 510 mL PEEP:  [5 cmH20] 5  cmH20 Plateau Pressure:  [15 cmH20-23 cmH20] 19 cmH20   INTAKE / OUTPUT:  Intake/Output Summary (Last 24 hours) at 02/19/14 0816 Last data filed at 02/19/14 0800  Gross per 24 hour  Intake 2270.3 ml  Output   4350 ml  Net -2079.7 ml    PHYSICAL EXAMINATION: General:  Morbidly obese male, in no distress Neuro:  Sedated, RASS -4 HEENT:  [place]/AT Cardiovascular:  Regular rate, irregular rhythm, no M/R/G. Lungs: Decreased breath sounds on left Abdomen:  Obese, soft, bs active  Musculoskeletal:  No acute deformities Skin:  Warm, 2+ BLE pitting edema  LABS: PULMONARY  Recent Labs Lab 02/12/14 0820 02/12/14 1140 02/12/14 1304 02/19/14 0103 02/19/14 0500  PHART 7.071*  --  7.451* 7.287* 7.386  PCO2ART CRITICAL RESULT CALLED TO, READ BACK BY AND VERIFIED WITH:  --  46.8* 60.6* 48.0*  PO2ART 90.3  --  204.0* 373.0* 65.2*  HCO3 33.8*  --  32.6* 28.0* 28.2*  TCO2 37.6  --  34 29.9 29.7  O2SAT 91.4 77.3 100.0 99.9 91.2   CBC  Recent Labs Lab 02/16/14 0400 02/17/14 0350 02/19/14 0423  HGB 10.9* 11.1* 9.8*  HCT 40.5 40.8 35.9*  WBC 8.2 7.3 8.7  PLT 229 238 238   COAGULATION  Recent Labs Lab 02/15/14 0350 02/16/14 0400 02/17/14 0350 02/18/14 0600 02/19/14 0534  INR 1.36 1.30 1.33 1.31 1.24   CARDIAC  Recent Labs Lab 02/12/14 1010 02/12/14 1610 02/12/14 2155  TROPONINI <0.03 <0.03 <0.03  No results for input(s): PROBNP in the last 168 hours.  CHEMISTRY  Recent Labs Lab 02/14/14 2300 02/15/14 0350 02/16/14 0400 02/17/14 0350 02/18/14 0600 02/19/14 0423  NA 144 145 148* 147* 143 137  K 4.4 4.0 4.4 4.5 4.7 5.2*  CL 103 104 108 105 103 101  CO2 36* 30 35* 35* 30 32  GLUCOSE 186* 161* 187* 225* 159* 168*  BUN 47* 46* 37* 41* 47* 44*  CREATININE 1.28 1.21 1.15 1.18 1.35 1.43*  CALCIUM 9.1 8.9 9.7 9.4 9.2 9.0  MG 2.3 2.2 2.2 2.3  --  2.1  PHOS 4.6 4.1 3.6 5.6*  --  4.7*   Estimated Creatinine Clearance: 66.6 mL/min (by C-G formula based on Cr of  1.43).  LIVER  Recent Labs Lab 02/15/14 0350 02/16/14 0400 02/17/14 0350 02/18/14 0600 02/19/14 0534  INR 1.36 1.30 1.33 1.31 1.24   INFECTIOUS  Recent Labs Lab 02/13/14 1112 02/14/14 0050 02/14/14 0420 02/15/14 0350  LATICACIDVEN  --  0.9  --   --   PROCALCITON 0.14  --  0.15 <0.10   ENDOCRINE CBG (last 3)   Recent Labs  02/19/14 0027 02/19/14 0413 02/19/14 0757  GLUCAP 112* 161* 194*   IMAGING x48h Portable Chest Xray  02/19/2014   CLINICAL DATA:  Shortness of breath.  Status post intubation.  EXAM: PORTABLE CHEST - 1 VIEW  COMPARISON:  Single view of the chest 02/16/2014.  FINDINGS: The patient is rotated on this study. Endotracheal tube is in place with the tip approximately 1.5 cm above the carina. Left IJ catheter and NG tube are unchanged. Left worse than right airspace disease persists and also appears unchanged. There is likely a left pleural effusion. Pleural thickening or fluid along the right chest wall is noted.  IMPRESSION: ET tube tip projects 1.5 cm above the carina.  No change in left worse right basilar airspace disease.   Electronically Signed   By: Inge Rise M.D.   On: 02/19/2014 00:53   Dg Abd Portable 1v  02/19/2014   CLINICAL DATA:  OG tube placement.  EXAM: PORTABLE ABDOMEN - 1 VIEW  COMPARISON:  None.  FINDINGS: OG tube is in place with the tip in the distal stomach. The stomach appears mildly distended.  IMPRESSION: As above.   Electronically Signed   By: Inge Rise M.D.   On: 02/19/2014 00:53    ASSESSMENT / PLAN:  PULMONARY OETT 2/10 >> 2/14 >> 2/16 A: Acute Hypercarbic Respiratory Failure - in the setting of decompensated dCHF, AKI, untreated OSA OSA - suspect non-compliant with CPAP Pulmonary edema Pleural effusion Former Smoker  P:   Full vent support, wean as able. VAP bundle. SBT daily once sedation is weaned solumedrol 14m q6hr x4 doses Xopenex q2hrs PRN. Will need to coordinate a new CPAP machine upon  discharge, evidently one at home does not work any longer.  CARDIOVASCULAR CVL L IJ TLC 2/10 >>  A:  Acute on chronic systolic CHF Atrial Fibrillation - chronic, on coumadin - RVR 2/13 New cardiomyopathy - EF now 35-40% HTN P:  Cards following; will need to discuss if pulmonary edema is related to worsened cardiac function Continue metoprolol, digoxin, ASA. Continue to hold cardizem. Continue heparin gtt per pharmacy. Hold outpatient bisoprolol, lisinopril, metolazone  RENAL A:   Acute on Chronic Kidney Disease - improved P:   Lasix 445mBID, although will need to watch creatinine BMP tonight at 2000, recheck AM  GASTROINTESTINAL A:   Morbid Obesity GI  prophylaxis Nutrition P:   SUP: Pantoprazole. Lasix 49m BID NPO TF  HEMATOLOGIC A:   Anemia - recent work up per primary MD in process, hx FOB +  VTE prophylaxis P:  Transfuse per usual ICU guidelines. Heparin gtt (previously on coumadin as outpatient). Monitor CBC  INFECTIOUS A:   No indication of infection P:   Monitor clinically  ENDOCRINE A:   DM Gout P:   SSI. D/c lantus given now NPO. Hold outpatient allopurinol.  NEUROLOGIC A:   Acute Metabolic Encephalopathy P:   Sedation:  Propofol gtt / Fentanyl PRN. RASS goal: 0 to -1. Daily WUA.   FAMILY UPDATES:  None   RCordelia Poche MD PGY-2, [facility] 02/19/2014, 8:19 AM

## 2014-02-19 NOTE — Progress Notes (Signed)
       Patient Name: William Flores Date of Encounter: 02/19/2014    SUBJECTIVE:Intubated. Developed resp failure around 11 PM and was intubated. Had stridor/upper airway noise yesteday on rounds but was not in distress.  TELEMETRY:  AF with controlled rate Filed Vitals:   02/19/14 0727 02/19/14 0755 02/19/14 0800 02/19/14 0900  BP: 118/71  115/75 116/71  Pulse: 88  79 78  Temp:  98.5 F (36.9 C)    TempSrc:  Oral    Resp: 18  15 16   Height:      Weight:      SpO2: 95%  96% 97%    Intake/Output Summary (Last 24 hours) at 02/19/14 0931 Last data filed at 02/19/14 0800  Gross per 24 hour  Intake 2270.3 ml  Output   4350 ml  Net -2079.7 ml   LABS: Basic Metabolic Panel:  Recent Labs  02/17/14 0350 02/18/14 0600 02/19/14 0423  NA 147* 143 137  K 4.5 4.7 5.2*  CL 105 103 101  CO2 35* 30 32  GLUCOSE 225* 159* 168*  BUN 41* 47* 44*  CREATININE 1.18 1.35 1.43*  CALCIUM 9.4 9.2 9.0  MG 2.3  --  2.1  PHOS 5.6*  --  4.7*   CBC:  Recent Labs  02/17/14 0350 02/19/14 0423  WBC 7.3 8.7  HGB 11.1* 9.8*  HCT 40.8 35.9*  MCV 85.7 82.5  PLT 238 238     Radiology/Studies:  No new data  Physical Exam: Blood pressure 116/71, pulse 78, temperature 98.5 F (36.9 C), temperature source Oral, resp. rate 16, height 5' 6"  (1.676 m), weight 273 lb 9.5 oz (124.1 kg), SpO2 97 %. Weight change: 99 lb 6.3 oz (45.083 kg)  Wt Readings from Last 3 Encounters:  02/19/14 273 lb 9.5 oz (124.1 kg)  02/10/14 294 lb 12.8 oz (133.72 kg)  12/19/13 284 lb (128.822 kg)    Decreased breath and cardiac sounds.  ASSESSMENT:  1. Acute resp. failure, hypercapneic, multifactorial, SHF, Obesity, OSA, ? Upper airway obs, ? Intrinsic lung disease. 2. Acute on chronic systolic heart failure with volume overload. 3. Acute on chronic kidney injury 4. Chronic AF with good rate control.  Plan:  1. Aggressive diuresis 2. Check dig level tomorrow and adjust depending on the renal  function 3. Agree hold metolazone   Signed, Sinclair Grooms 02/19/2014, 9:31 AM

## 2014-02-19 NOTE — Progress Notes (Signed)
NUTRITION FOLLOW UP / CONSULT  Intervention:    Initiate TF via OGT with Vital High Protein at 15 ml/h and Prostat 60 ml TID on day 1; on day 2, continue with goal rate of 15 ml/h (360 ml per day) and Prostat 60 ml TID to provide 960 kcals, 122 gm protein (93% of estimated needs), 301 ml free water daily.  Above TF regimen plus current kcals from Propofol will provide a total of 1657 kcals per day (25 kcals/kg ideal weight).  Unable to meet 100% of protein needs at this time without overfeeding due to Propofol.   Nutrition Dx:   Inadequate oral intake related to inability to eat as evidenced by NPO status; ongoing.  Goal:   Enteral nutrition to provide 60-70% of estimated calorie needs (22-25 kcals/kg ideal body weight) and 100% of estimated protein needs, based on ASPEN guidelines for hypocaloric, high protein feeding in critically ill obese individuals, unmet.  Monitor:   TF tolerance/adequacy, weight trend, labs, vent status.  Assessment:   Pt hx of DM, A.fib, sleep apnea, CHF, morbid obesity. Pt came to ED with SOB associated with 10 lb wt gain and abdominal fullness.   Patient was extubated on 2/14, required re-intubation on 2/17. Received MD Consult for TF initiation and management. Discussed patient in ICU rounds and with RN today. Likely not much change to Propofol rate today.  Patient is currently intubated on ventilator support MV: 7.2 L/min Temp (24hrs), Avg:98.5 F (36.9 C), Min:98.2 F (36.8 C), Max:98.7 F (37.1 C)  Propofol: 26.4 ml/hr providing 697 kcals per day.    Height: Ht Readings from Last 1 Encounters:  02/19/14 5' 6"  (1.676 m)    Weight Status:   Wt Readings from Last 1 Encounters:  02/19/14 273 lb 9.5 oz (124.1 kg)   02/18/14 277 lb 9 oz (125.9 kg)       02/12/14 289 lb 14.5 oz (131.5 kg)       Re-estimated needs:  Kcal: 1940 Protein: 130-160 gm Fluid: 2 L  Skin: stage I pressure ulcer on sacrum  Diet Order: Diet NPO time  specified   Intake/Output Summary (Last 24 hours) at 02/19/14 1025 Last data filed at 02/19/14 1000  Gross per 24 hour  Intake 2382.22 ml  Output   4350 ml  Net -1967.78 ml    Last BM: 2/16   Labs:   Recent Labs Lab 02/16/14 0400 02/17/14 0350 02/18/14 0600 02/19/14 0423  NA 148* 147* 143 137  K 4.4 4.5 4.7 5.2*  CL 108 105 103 101  CO2 35* 35* 30 32  BUN 37* 41* 47* 44*  CREATININE 1.15 1.18 1.35 1.43*  CALCIUM 9.7 9.4 9.2 9.0  MG 2.2 2.3  --  2.1  PHOS 3.6 5.6*  --  4.7*  GLUCOSE 187* 225* 159* 168*    CBG (last 3)   Recent Labs  02/19/14 0027 02/19/14 0413 02/19/14 0757  GLUCAP 112* 161* 194*    Scheduled Meds: . antiseptic oral rinse  7 mL Mouth Rinse QID  . aspirin  81 mg Per Tube Daily  . chlorhexidine  15 mL Mouth Rinse BID  . [START ON 02/20/2014] digoxin  0.125 mg Per Tube Daily  . digoxin  0.25 mg Per Tube TID  . etomidate  0.3 mg/kg Intravenous Once  . [START ON 02/20/2014] furosemide  40 mg Intravenous BID  . insulin aspart  0-20 Units Subcutaneous 6 times per day  . ketotifen  1 drop Both Eyes BID  .  methylPREDNISolone (SOLU-MEDROL) injection  60 mg Intravenous 4 times per day  . metoprolol tartrate  75 mg Per Tube BID  . midazolam      . pantoprazole (PROTONIX) IV  40 mg Intravenous Daily  . polyethylene glycol  17 g Oral Daily  . rocuronium  50 mg Intravenous Once    Continuous Infusions: . sodium chloride 10 mL/hr at 02/17/14 1020  . heparin 2,150 Units/hr (02/19/14 0050)  . propofol 35 mcg/kg/min (02/19/14 1001)    Molli Barrows, RD, LDN, [place] Pager [phone] After Hours Pager [phone]

## 2014-02-19 NOTE — Progress Notes (Addendum)
I gave the pt ativan to help tolerate BiPAP. Pt was placed back on BiPAP.

## 2014-02-19 NOTE — Progress Notes (Signed)
Pt. Was transported to 2MO7 without any complications.

## 2014-02-19 NOTE — Progress Notes (Signed)
ANTICOAGULATION CONSULT NOTE - Follow Up Consult  Pharmacy Consult:  Heparin + Coumadin Indication: atrial fibrillation  Allergies  Allergen Reactions  . Codeine     Headache     Patient Measurements: Height: 5' 6"  (167.6 cm) Weight: 273 lb 9.5 oz (124.1 kg) IBW/kg (Calculated) : 63.8 Heparin Dosing Weight: 91 kg  Vital Signs: Temp: 98.7 F (37.1 C) (02/17 0414) Temp Source: Oral (02/17 0414) BP: 113/74 mmHg (02/17 0645) Pulse Rate: 73 (02/17 0645)  Labs:  Recent Labs  02/17/14 0350  02/18/14 0600 02/18/14 0644 02/19/14 0423 02/19/14 0534  HGB 11.1*  --   --   --  9.8*  --   HCT 40.8  --   --   --  35.9*  --   PLT 238  --   --   --  238  --   LABPROT 16.7*  --  16.4*  --   --  15.8*  INR 1.33  --  1.31  --   --  1.24  HEPARINUNFRC 0.52  < > <0.10* 0.29*  --  0.40  CREATININE 1.18  --  1.35  --  1.43*  --   < > = values in this interval not displayed.  Estimated Creatinine Clearance: 66.6 mL/min (by C-G formula based on Cr of 1.43).    Assessment: 44 YOM with history of Afib to continue on IV heparin and Coumadin.  INR remains sub-therapeutic as expected since Coumadin was resumed yesterday.  Heparin level is therapeutic; no bleeding reported.  Noted patient transferred to the ICU due to respiratory distress and was intubated.   Goal of Therapy: Heparin level 0.3-0.7 units/ml  INR 2 - 3 Monitor platelets by anticoagulation protocol: Yes    Plan:  - Coumadin 57m PO today - Continue heparin gtt at 2150 units/hr - Daily HL / CBC / PT / INR    Itsel Opfer D. DMina Marble PharmD, BCPS Pager:  [phone]/17/2016, 8:35 AM

## 2014-02-19 NOTE — Procedures (Signed)
Intubation Procedure Note William Flores [id number] [birth year]  Procedure: Intubation Indications: Respiratory insufficiency  Procedure Details Consent: Unable to obtain consent because of altered level of consciousness. Time Out: Verified patient identification, verified procedure, site/side was marked, verified correct patient position, special equipment/implants available, medications/allergies/relevent history reviewed, required imaging and test results available.  Performed  Drugs 173mg Fentanyl, 285mVersed, 2040mtomidate. Indirect laryngoscopy using glidescope with # 4 blade. 7.5 tube passed through cords under direct visualization.  Evaluation Hemodynamic Status: BP stable throughout; O2 sats: stable throughout Patient's Current Condition: stable Complications: No apparent complications Patient did tolerate procedure well. Chest X-ray ordered to verify placement.  CXR: pending.   RahMontey HoraA [facility] Pgr: ([phone] ([phone]/2016, 12:35 AM  I was present for and directly supervised the procedure  BreRoselie AwkwardD LeBAtlantaCM Pager: [phone]: [phone] no response, call [phone]

## 2014-02-19 NOTE — Progress Notes (Signed)
Pt began to cough and dry hive with BiPAP on. I removed the BiPAP and placed the pt on 6L [place]. The pt desat into the 50's, RRT was called, NonRebreather was placed. Rapid Response and Critical Care was called. Pt was in distress, labored breathing, and coughing. There was a change in mental status, the pt was trying to get out of bed and seem confused. Critical Care decided intubate and pt will be transferred to MICU room 7.

## 2014-02-20 ENCOUNTER — Inpatient Hospital Stay (HOSPITAL_COMMUNITY): Payer: BLUE CROSS/BLUE SHIELD

## 2014-02-20 DIAGNOSIS — I428 Other cardiomyopathies: Secondary | ICD-10-CM

## 2014-02-20 LAB — POCT I-STAT 3, ART BLOOD GAS (G3+)
ACID-BASE EXCESS: 7 mmol/L — AB (ref 0.0–2.0)
Bicarbonate: 34.5 mEq/L — ABNORMAL HIGH (ref 20.0–24.0)
O2 Saturation: 98 %
PO2 ART: 103 mmHg — AB (ref 80.0–100.0)
Patient temperature: 98.4
TCO2: 36 mmol/L (ref 0–100)
pCO2 arterial: 58.3 mmHg (ref 35.0–45.0)
pH, Arterial: 7.38 (ref 7.350–7.450)

## 2014-02-20 LAB — BASIC METABOLIC PANEL
Anion gap: 6 (ref 5–15)
BUN: 47 mg/dL — ABNORMAL HIGH (ref 6–23)
CO2: 34 mmol/L — ABNORMAL HIGH (ref 19–32)
Calcium: 9.3 mg/dL (ref 8.4–10.5)
Chloride: 102 mmol/L (ref 96–112)
Creatinine, Ser: 1.28 mg/dL (ref 0.50–1.35)
GFR calc non Af Amer: 58 mL/min — ABNORMAL LOW (ref >90)
GFR, EST AFRICAN AMERICAN: 68 mL/min — AB (ref >90)
Glucose, Bld: 161 mg/dL — ABNORMAL HIGH (ref 70–99)
Potassium: 3.7 mmol/L (ref 3.5–5.1)
Sodium: 142 mmol/L (ref 135–145)

## 2014-02-20 LAB — GLUCOSE, CAPILLARY
GLUCOSE-CAPILLARY: 126 mg/dL — AB (ref 70–99)
GLUCOSE-CAPILLARY: 169 mg/dL — AB (ref 70–99)
GLUCOSE-CAPILLARY: 142 mg/dL — AB (ref 70–99)
Glucose-Capillary: 122 mg/dL — ABNORMAL HIGH (ref 70–99)
Glucose-Capillary: 125 mg/dL — ABNORMAL HIGH (ref 70–99)
Glucose-Capillary: 159 mg/dL — ABNORMAL HIGH (ref 70–99)

## 2014-02-20 LAB — CBC
HEMATOCRIT: 35.4 % — AB (ref 39.0–52.0)
Hemoglobin: 10.2 g/dL — ABNORMAL LOW (ref 13.0–17.0)
MCH: 23.3 pg — ABNORMAL LOW (ref 26.0–34.0)
MCHC: 28.8 g/dL — ABNORMAL LOW (ref 30.0–36.0)
MCV: 80.8 fL (ref 78.0–100.0)
Platelets: 224 10*3/uL (ref 150–400)
RBC: 4.38 MIL/uL (ref 4.22–5.81)
RDW: 20.9 % — ABNORMAL HIGH (ref 11.5–15.5)
WBC: 9.1 10*3/uL (ref 4.0–10.5)

## 2014-02-20 LAB — HEPARIN LEVEL (UNFRACTIONATED): HEPARIN UNFRACTIONATED: 0.58 [IU]/mL (ref 0.30–0.70)

## 2014-02-20 MED ORDER — PANTOPRAZOLE SODIUM 40 MG PO PACK
40.0000 mg | PACK | Freq: Every day | ORAL | Status: DC
  Administered 2014-02-21: 40 mg
  Filled 2014-02-20 (×2): qty 20

## 2014-02-20 MED ORDER — SODIUM CHLORIDE 0.9 % IV SOLN
25.0000 ug/h | INTRAVENOUS | Status: DC
  Administered 2014-02-20: 100 ug/h via INTRAVENOUS
  Filled 2014-02-20 (×3): qty 50

## 2014-02-20 MED ORDER — POTASSIUM CHLORIDE CRYS ER 20 MEQ PO TBCR: 40.0000 meq | EXTENDED_RELEASE_TABLET | Freq: Every day | ORAL | Status: DC

## 2014-02-20 MED ORDER — FENTANYL BOLUS VIA INFUSION
50.0000 ug | INTRAVENOUS | Status: DC | PRN
  Administered 2014-02-20: 50 ug via INTRAVENOUS
  Filled 2014-02-20: qty 50

## 2014-02-20 MED ORDER — FENTANYL CITRATE 0.05 MG/ML IJ SOLN: 50.0000 ug | Freq: Once | INTRAMUSCULAR | Status: DC

## 2014-02-20 MED ORDER — LISINOPRIL 2.5 MG PO TABS
2.5000 mg | ORAL_TABLET | Freq: Every day | ORAL | Status: DC
  Administered 2014-02-20: 2.5 mg via ORAL
  Filled 2014-02-20 (×2): qty 1

## 2014-02-20 MED ORDER — POTASSIUM CHLORIDE 20 MEQ/15ML (10%) PO SOLN
40.0000 meq | Freq: Every day | ORAL | Status: DC
  Administered 2014-02-20 – 2014-02-21 (×2): 40 meq via ORAL
  Filled 2014-02-20 (×4): qty 30

## 2014-02-20 NOTE — Progress Notes (Signed)
    Recent Labs Lab 02/19/14 0103 02/19/14 0500 02/20/14 1529  PHART 7.287* 7.386 7.380  PCO2ART 60.6* 48.0* 58.3*  PO2ART 373.0* 65.2* 103.0*  HCO3 28.0* 28.2* 34.5*  TCO2 29.9 29.7 36  O2SAT 99.9 91.2 98.0    ABG reviewed Patient doing well on sbt  A Acute resp failure  P No extubation 02/20/2014 Rest back on PRVC  Dr. Brand Males, M.D., [facility].C.P Pulmonary and Critical Care Medicine Staff Physician [place] [facility] Pager: [phone], If no answer or between  15:00h - 7:00h: call [phone]  02/20/2014 3:37 PM

## 2014-02-20 NOTE — Progress Notes (Signed)
       Patient Name: William Flores Date of Encounter: 02/20/2014    SUBJECTIVE:  Intubated  Reviewed chart extensively and he has no CAD by cath 2012. Since 2008, EF has varied between 30 and 50%. No marker or ECG evidence of ischemia since admission. BNP levels are all normal.  TELEMETRY:  A fib with controlled v rate. Filed Vitals:   02/20/14 0500 02/20/14 0600 02/20/14 0700 02/20/14 0733  BP: 128/67 124/64 117/75 117/75  Pulse: 81 96 85 92  Temp:      TempSrc:      Resp: 18 16 17 15   Height:      Weight: 270 lb 8.1 oz (122.7 kg)     SpO2: 95% 94% 96% 98%    Intake/Output Summary (Last 24 hours) at 02/20/14 0803 Last data filed at 02/20/14 0700  Gross per 24 hour  Intake 1493.69 ml  Output   2355 ml  Net -861.31 ml   LABS: Basic Metabolic Panel:  Recent Labs  02/19/14 0423 02/19/14 2130  NA 137 139  K 5.2* 4.5  CL 101 104  CO2 32 32  GLUCOSE 168* 256*  BUN 44* 48*  CREATININE 1.43* 1.24  CALCIUM 9.0 8.9  MG 2.1  --   PHOS 4.7*  --    CBC:  Recent Labs  02/19/14 0423 02/20/14 0500  WBC 8.7 9.1  HGB 9.8* 10.2*  HCT 35.9* 35.4*  MCV 82.5 80.8  PLT 238 PENDING   BNP    Component Value Date/Time   BNP 75.9 02/15/2014 0350   BNP 105.5* 11/13/2013 1121    ProBNP    Component Value Date/Time   PROBNP 325.7* 10/28/2013 1746     Radiology/Studies:  Airspace disease with effusions  Physical Exam: Blood pressure 117/75, pulse 92, temperature 99.4 F (37.4 C), temperature source Oral, resp. rate 15, height 5' 6"  (1.676 m), weight 270 lb 8.1 oz (122.7 kg), SpO2 98 %. Weight change: -3 lb 1.4 oz (-1.4 kg)  Wt Readings from Last 3 Encounters:  02/20/14 270 lb 8.1 oz (122.7 kg)  02/10/14 294 lb 12.8 oz (133.72 kg)  12/19/13 284 lb (128.822 kg)    Distant breath sounds  ASSESSMENT:  1. Respiratory failure is multifactorial, with a component of A/C systolic heart failure. Also major contribution from sleep apnea, upper airway disease, and  possible infection. 2. No evidence that ischemic heart disease is at play. Cath in 2012 without coronary obstruction. 3. Atrial fib with controlled rate  Plan:  1. Dig and beta blocker for rate control. 2. Diuresis as tolerated by kidney function 3. Check dig level in AM 4. There is no role for ischemic workup.  Valerie Roys W 02/20/2014, 8:03 AM

## 2014-02-20 NOTE — Progress Notes (Addendum)
MD notified in the box about ABG.  MD said to place patient back on rest mode til tomorrow.

## 2014-02-20 NOTE — Progress Notes (Signed)
PULMONARY / CRITICAL CARE MEDICINE   Name: William Flores MRN: [id number] DOB: [birth year]    ADMISSION DATE:  02/10/2014 CONSULTATION DATE:  02/12/14  REFERRING MD :  Dr. Candiss Norse   CHIEF COMPLAINT:  Acute Respiratory Failure / AMS   INITIAL PRESENTATION:   64 y/o M with a PMH of Afib, HLD, DM, OSA on CPAP, dCHF who presented 2/8 from [place] with SOB, weight gain, non productive cough and orthopnea. Hypoxic on presentation with sats 70's.  Initially admitted by [facility] with decompensated CHF and AKI.  Attempted diuresis and bipap, but pt refused bipap.  On 2/10, he decompensated with worsening of hypercarbic respiratory failure and was emergently transferred to ICU for intubation.  Extubated 2/14. Re-intubated 2/16.   SIGNIFICANT EVENTS: 2/08  Admit with AKI / decompensated CHF 2/10  Decompensated on floor with hypercarbic respiratory failure, to ICU for emergent intubation.  02/13/14 - on levophed, heparin gtt and fent gtt with versed prn. Creat better. On vent. ECHO with LVEF 35%, SCVO2 77%  2/13 cards consult 2/14 RVR, extubated 2/16 Re-intubated for respiratory distress, inability to tolerate BiPAP.  STUDIES:  2D echo 2/10>>> EF 35-40%,  Diffuse hypokinesis, mild TR, normal PASP    SUBJECTIVE/OVERNIGHT/INTERVAL HX Sedated on propofol UOP of 2.5L over last 24 hours Failed SBT today  VITAL SIGNS: Temp:  [98.4 F (36.9 C)-99.7 F (37.6 C)] 99.4 F (37.4 C) (02/18 0405) Pulse Rate:  [65-96] 96 (02/18 0600) Resp:  [15-23] 16 (02/18 0600) BP: (102-129)/(51-75) 124/64 mmHg (02/18 0600) SpO2:  [94 %-100 %] 94 % (02/18 0600) FiO2 (%):  [40 %-50 %] 40 % (02/18 0330) Weight:  [270 lb 8.1 oz (122.7 kg)] 270 lb 8.1 oz (122.7 kg) (02/18 0500)   HEMODYNAMICS:     VENTILATOR SETTINGS: Vent Mode:  [-] PRVC FiO2 (%):  [40 %-50 %] 40 % Set Rate:  [15 bmp] 15 bmp Vt Set:  [510 mL] 510 mL PEEP:  [5 cmH20] 5 cmH20 Plateau Pressure:  [17 cmH20-20 cmH20] 20 cmH20   INTAKE  / OUTPUT:  Intake/Output Summary (Last 24 hours) at 02/20/14 0654 Last data filed at 02/20/14 0600  Gross per 24 hour  Intake 1540.19 ml  Output   2405 ml  Net -864.81 ml    PHYSICAL EXAMINATION: General:  Morbidly obese male, in no distress Neuro:  Sedated, RASS -3 HEENT:  [place]/AT Cardiovascular:  Regular rate, irregular rhythm, no M/R/G. Lungs: Decreased breath sounds on left, no wheezing Abdomen:  Obese, soft, bs active  Musculoskeletal:  No acute deformities Skin:  Warm, 2+ BLE pitting edema  LABS: PULMONARY  Recent Labs Lab 02/19/14 0103 02/19/14 0500  PHART 7.287* 7.386  PCO2ART 60.6* 48.0*  PO2ART 373.0* 65.2*  HCO3 28.0* 28.2*  TCO2 29.9 29.7  O2SAT 99.9 91.2   CBC  Recent Labs Lab 02/16/14 0400 02/17/14 0350 02/19/14 0423  HGB 10.9* 11.1* 9.8*  HCT 40.5 40.8 35.9*  WBC 8.2 7.3 8.7  PLT 229 238 238   COAGULATION  Recent Labs Lab 02/15/14 0350 02/16/14 0400 02/17/14 0350 02/18/14 0600 02/19/14 0534  INR 1.36 1.30 1.33 1.31 1.24   CARDIAC No results for input(s): TROPONINI in the last 168 hours. No results for input(s): PROBNP in the last 168 hours.  CHEMISTRY  Recent Labs Lab 02/14/14 2300 02/15/14 0350 02/16/14 0400 02/17/14 0350 02/18/14 0600 02/19/14 0423 02/19/14 2130  NA 144 145 148* 147* 143 137 139  K 4.4 4.0 4.4 4.5 4.7 5.2* 4.5  CL 103  104 108 105 103 101 104  CO2 36* 30 35* 35* 30 32 32  GLUCOSE 186* 161* 187* 225* 159* 168* 256*  BUN 47* 46* 37* 41* 47* 44* 48*  CREATININE 1.28 1.21 1.15 1.18 1.35 1.43* 1.24  CALCIUM 9.1 8.9 9.7 9.4 9.2 9.0 8.9  MG 2.3 2.2 2.2 2.3  --  2.1  --   PHOS 4.6 4.1 3.6 5.6*  --  4.7*  --    Estimated Creatinine Clearance: 76.4 mL/min (by C-G formula based on Cr of 1.24).  LIVER  Recent Labs Lab 02/15/14 0350 02/16/14 0400 02/17/14 0350 02/18/14 0600 02/19/14 0534  INR 1.36 1.30 1.33 1.31 1.24   INFECTIOUS  Recent Labs Lab 02/13/14 1112 02/14/14 0050 02/14/14 0420  02/15/14 0350  LATICACIDVEN  --  0.9  --   --   PROCALCITON 0.14  --  0.15 <0.10   ENDOCRINE CBG (last 3)   Recent Labs  02/19/14 1935 02/20/14 0009 02/20/14 0406  GLUCAP 233* 122* 126*   IMAGING x48h Dg Chest Port 1 View  02/20/2014   CLINICAL DATA:  Endotracheal tube  EXAM: PORTABLE CHEST - 1 VIEW  COMPARISON:  02/19/2014  FINDINGS: The endotracheal tube tip is approximately 1.8 cm above the carina. Nasogastric tube extends into the stomach and off the inferior edge of the image There is a left jugular central line extending into the SVC. Airspace opacities persist bilaterally without significant interval change. There are effusions, left greater than right.  IMPRESSION: Probable effusions, and unchanged multifocal airspace opacities.   Electronically Signed   By: Andreas Newport M.D.   On: 02/20/2014 03:57   Portable Chest Xray  02/19/2014   CLINICAL DATA:  Initial in contour for intubation  EXAM: PORTABLE CHEST - 1 VIEW  COMPARISON:  12:33 a.m.  FINDINGS: Endotracheal tube again projects about 1.5 cm above the carinal. Cardiac enlargement is stable. NG tube is unchanged. Left central line is unchanged.  There is bilateral perihilar opacity, stable the mildly worse when compared the prior study. Retrocardiac area is not well evaluated but appears demonstrate increasing opacification.  IMPRESSION: Unchanged endotracheal tube. Bilateral perihilar airspace opacity, left worse than right and worse bilaterally when compared to recent prior study. Possibilities include atelectasis and pneumonia. Pulmonary edema is a consideration but felt to be less likely.   Electronically Signed   By: Skipper Cliche M.D.   On: 02/19/2014 08:49   Portable Chest Xray  02/19/2014   CLINICAL DATA:  Shortness of breath.  Status post intubation.  EXAM: PORTABLE CHEST - 1 VIEW  COMPARISON:  Single view of the chest 02/16/2014.  FINDINGS: The patient is rotated on this study. Endotracheal tube is in place with the  tip approximately 1.5 cm above the carina. Left IJ catheter and NG tube are unchanged. Left worse than right airspace disease persists and also appears unchanged. There is likely a left pleural effusion. Pleural thickening or fluid along the right chest wall is noted.  IMPRESSION: ET tube tip projects 1.5 cm above the carina.  No change in left worse right basilar airspace disease.   Electronically Signed   By: Inge Rise M.D.   On: 02/19/2014 00:53   Dg Abd Portable 1v  02/19/2014   CLINICAL DATA:  OG tube placement.  EXAM: PORTABLE ABDOMEN - 1 VIEW  COMPARISON:  None.  FINDINGS: OG tube is in place with the tip in the distal stomach. The stomach appears mildly distended.  IMPRESSION: As above.   Electronically  Signed   By: Inge Rise M.D.   On: 02/19/2014 00:53    ASSESSMENT / PLAN:  PULMONARY OETT 2/10 >> 2/14 >> 2/16 A: Acute Hypercarbic Respiratory Failure - in the setting of decompensated dCHF, AKI, untreated OSA OSA - suspect non-compliant with CPAP Pulmonary edema Pleural effusion Former Smoker  P:   Full vent support, wean as able VAP bundle SBT daily once sedation is weaned Solumedrol stopped Xopenex q2hrs PRN Will need to coordinate a new CPAP machine upon discharge, evidently one at home does not work any longer  CARDIOVASCULAR CVL L IJ TLC 2/10 >>  A:  Acute on chronic systolic CHF Atrial Fibrillation - chronic, on coumadin - RVR 2/13 New cardiomyopathy - EF now 35-40% HTN P:  Cards following Continue metoprolol, digoxin, ASA. Continue to hold cardizem. Continue heparin gtt per pharmacy Low dose lisinopril Hold outpatient bisoprolol, metolazone  RENAL A:   Acute on Chronic Kidney Disease - improving P:   Lasix 35m BID BMP daily  GASTROINTESTINAL A:   Morbid Obesity GI prophylaxis Nutrition P:   SUP: Pantoprazole. Lasix 471mBID NPO TF  HEMATOLOGIC A:   Anemia - recent work up per primary MD in process, hx FOB +  VTE prophylaxis P:   Transfuse per usual ICU guidelines. Heparin gtt (previously on coumadin as outpatient). Monitor CBC  INFECTIOUS A:   No indication of infection P:   Monitor clinically  ENDOCRINE A:   Hyperglycemia Gout P: SSI Hold outpatient allopurinol.  NEUROLOGIC A:   Acute Metabolic Encephalopathy P:   Sedation:  Switch to fentanyl ggt and wean off Propofol gtt  Continue Fentanyl PRN RASS goal: 0 to -1. Daily WUA.   FAMILY UPDATES:  Son, ex-wife and patient's friend updated with patient's current status and plan moving forward. All are in understanding of the plan and agree.   RaCordelia PocheMD PGY-2, [facility] 02/20/2014, 6:54 AM

## 2014-02-20 NOTE — Progress Notes (Signed)
PT Cancellation Note  Patient Details Name: William Flores MRN: [id number] DOB: 22-Aug-1950   Cancelled Treatment:    Reason Eval/Treat Not Completed: Medical issues which prohibited therapy (pt intubated again and order discontinued. Will need new order when medically appropriate)   Melford Aase 02/20/2014, 7:49 AM Elwyn Reach, [place]

## 2014-02-20 NOTE — Progress Notes (Signed)
ANTICOAGULATION CONSULT NOTE - Follow Up Consult  Pharmacy Consult for Heparin Indication: atrial fibrillation  Allergies  Allergen Reactions  . Codeine     Headache     Patient Measurements: Height: 5' 6"  (167.6 cm) Weight: 270 lb 8.1 oz (122.7 kg) IBW/kg (Calculated) : 63.8 Heparin Dosing Weight: 91kg  Vital Signs: Temp: 98.3 F (36.8 C) (02/18 0849) Temp Source: Oral (02/18 0849) BP: 112/64 mmHg (02/18 0900) Pulse Rate: 81 (02/18 0900)  Labs:  Recent Labs  02/18/14 0600 02/18/14 0644 02/19/14 0423 02/19/14 0534 02/19/14 2130 02/20/14 0500  HGB  --   --  9.8*  --   --  10.2*  HCT  --   --  35.9*  --   --  35.4*  PLT  --   --  238  --   --  224  LABPROT 16.4*  --   --  15.8*  --   --   INR 1.31  --   --  1.24  --   --   HEPARINUNFRC <0.10* 0.29*  --  0.40  --  0.58  CREATININE 1.35  --  1.43*  --  1.24  --     Estimated Creatinine Clearance: 76.4 mL/min (by C-G formula based on Cr of 1.24).   Assessment: CC: 2/8 with dyspnea, abd distention, LE edema, 10lb wt gain: acute on chronic CHF  AC: Coumadin PTA for Afib (Coumadin 7.50m daily except 134mMon / Fri) >> IV heparin. Coumadin held again for possible trach. Heparin level 0.58 in goal. CBC stable.  Infectious Disease: Tmax 99.7, WBC WNL - no abx. Very poor oral hygiene.   Cardiovascular: HTN, AFib (CHADsVASc = 3), HLD, CHF (EF 35-40%). Repeat ECHO in 6 weeks. If EF does not improve, could consider cath at that time. BP controlled. TG 130. No plans for ischemic w/u. -- ASA81, lisinopril, Lopressor, Lasix IV, digoxin  Endocrinology: DM - CBGs controlled on SSI (SM) impved now down in the 120's since midnight.  GI / Nutr: starting TF - Miralax, PPI IV, BM x5  Neurology: anxiety. Propofol + PRN Fentanyl  Nephro: AoCKD3 - SCr up 1.24 down, K+ 4.5, Phos 4.7 - increased  Pulm: OSA - extubated 2/14, reintubated 2/16 d/t respiratory distress, FiO2 40% - Xopenex  Hematology / Oncology: colon cancer -  hgb down 9.8, plts WNL   PTA Medication Issues: allopurinol, bisoprolol, iron, lisinopril, sitagliptin, metformin, Coumadin  Best Practices: heparin gtt + Coumadin, MC, PPI PT  Goal of Therapy:  Heparin level 0.3-0.7 units/ml Monitor platelets by anticoagulation protocol: Yes   Plan:  F/u digoxin level in AM. Continue IV heparin at 2150 units/hr Daily HL and CBC   Shakiera Edelson S. RoAlford HighlandPharmD, BCPS Clinical Staff Pharmacist Pager [phone] Ghazitillinger 02/20/2014,9:55 AM

## 2014-02-21 DIAGNOSIS — I481 Persistent atrial fibrillation: Secondary | ICD-10-CM

## 2014-02-21 DIAGNOSIS — I429 Cardiomyopathy, unspecified: Secondary | ICD-10-CM

## 2014-02-21 LAB — BASIC METABOLIC PANEL
ANION GAP: 6 (ref 5–15)
BUN: 51 mg/dL — ABNORMAL HIGH (ref 6–23)
CHLORIDE: 103 mmol/L (ref 96–112)
CO2: 35 mmol/L — AB (ref 19–32)
Calcium: 9.1 mg/dL (ref 8.4–10.5)
Creatinine, Ser: 1.2 mg/dL (ref 0.50–1.35)
GFR calc Af Amer: 73 mL/min — ABNORMAL LOW (ref >90)
GFR calc non Af Amer: 63 mL/min — ABNORMAL LOW (ref >90)
Glucose, Bld: 130 mg/dL — ABNORMAL HIGH (ref 70–99)
POTASSIUM: 3.8 mmol/L (ref 3.5–5.1)
Sodium: 144 mmol/L (ref 135–145)

## 2014-02-21 LAB — GLUCOSE, CAPILLARY
GLUCOSE-CAPILLARY: 133 mg/dL — AB (ref 70–99)
GLUCOSE-CAPILLARY: 161 mg/dL — AB (ref 70–99)
GLUCOSE-CAPILLARY: 167 mg/dL — AB (ref 70–99)
GLUCOSE-CAPILLARY: 180 mg/dL — AB (ref 70–99)
Glucose-Capillary: 158 mg/dL — ABNORMAL HIGH (ref 70–99)
Glucose-Capillary: 161 mg/dL — ABNORMAL HIGH (ref 70–99)

## 2014-02-21 LAB — DIGOXIN LEVEL: Digoxin Level: 0.3 ng/mL — ABNORMAL LOW (ref 0.8–2.0)

## 2014-02-21 LAB — CBC
HEMATOCRIT: 39.1 % (ref 39.0–52.0)
Hemoglobin: 10.6 g/dL — ABNORMAL LOW (ref 13.0–17.0)
MCH: 22.5 pg — AB (ref 26.0–34.0)
MCHC: 27.1 g/dL — ABNORMAL LOW (ref 30.0–36.0)
MCV: 82.8 fL (ref 78.0–100.0)
PLATELETS: 235 10*3/uL (ref 150–400)
RBC: 4.72 MIL/uL (ref 4.22–5.81)
RDW: 21.4 % — ABNORMAL HIGH (ref 11.5–15.5)
WBC: 6.7 10*3/uL (ref 4.0–10.5)

## 2014-02-21 LAB — HEPARIN LEVEL (UNFRACTIONATED): HEPARIN UNFRACTIONATED: 0.6 [IU]/mL (ref 0.30–0.70)

## 2014-02-21 MED ORDER — HEPARIN (PORCINE) IN NACL 100-0.45 UNIT/ML-% IJ SOLN
2100.0000 [IU]/h | INTRAMUSCULAR | Status: AC
  Administered 2014-02-21 – 2014-02-23 (×5): 2100 [IU]/h via INTRAVENOUS
  Filled 2014-02-21 (×10): qty 250

## 2014-02-21 MED ORDER — LISINOPRIL 5 MG PO TABS
5.0000 mg | ORAL_TABLET | Freq: Every day | ORAL | Status: DC
  Administered 2014-02-21 – 2014-02-26 (×6): 5 mg via ORAL
  Filled 2014-02-21 (×6): qty 1

## 2014-02-21 MED ORDER — VANCOMYCIN HCL IN DEXTROSE 750-5 MG/150ML-% IV SOLN
750.0000 mg | Freq: Two times a day (BID) | INTRAVENOUS | Status: DC
  Administered 2014-02-21 – 2014-02-23 (×3): 750 mg via INTRAVENOUS
  Filled 2014-02-21 (×6): qty 150

## 2014-02-21 MED ORDER — SODIUM CHLORIDE 0.9 % IV SOLN
2000.0000 mg | Freq: Once | INTRAVENOUS | Status: AC
  Administered 2014-02-21: 2000 mg via INTRAVENOUS
  Filled 2014-02-21: qty 2000

## 2014-02-21 MED ORDER — METOPROLOL TARTRATE 1 MG/ML IV SOLN
5.0000 mg | Freq: Four times a day (QID) | INTRAVENOUS | Status: DC
  Administered 2014-02-22 (×2): 5 mg via INTRAVENOUS
  Filled 2014-02-21 (×6): qty 5

## 2014-02-21 NOTE — Progress Notes (Signed)
[place] Progress Note Patient Name: William Flores DOB: [birth year] MRN: [id number]   Date of Service  02/21/2014  HPI/Events of Note  Patient with Afib and now on bipap.  Unable to take bipap off to take lopressor dose orally.  eICU Interventions  Change oral lopressor to IV and restart oral as soon as able     Intervention Category Intermediate Interventions: Arrhythmia - evaluation and management  Mauri [place], P 02/21/2014, 10:39 PM

## 2014-02-21 NOTE — Progress Notes (Signed)
ANTICOAGULATION CONSULT NOTE - Follow Up Consult  Pharmacy Consult:  Heparin Indication: atrial fibrillation  Allergies  Allergen Reactions  . Codeine     Headache     Patient Measurements: Height: 5' 6"  (167.6 cm) Weight: 262 lb 2 oz (118.9 kg) IBW/kg (Calculated) : 63.8 Heparin Dosing Weight: 91kg  Vital Signs: Temp: 99.3 F (37.4 C) (02/19 0750) Temp Source: Oral (02/19 0750) BP: 133/72 mmHg (02/19 0811) Pulse Rate: 115 (02/19 0811)  Labs:  Recent Labs  02/19/14 0423 02/19/14 0534 02/19/14 2130 02/20/14 0500 02/20/14 1030 02/21/14 0500  HGB 9.8*  --   --  10.2*  --  10.6*  HCT 35.9*  --   --  35.4*  --  39.1  PLT 238  --   --  224  --  235  LABPROT  --  15.8*  --   --   --   --   INR  --  1.24  --   --   --   --   HEPARINUNFRC  --  0.40  --  0.58  --  0.60  CREATININE 1.43*  --  1.24  --  1.28 1.20    Estimated Creatinine Clearance: 77.5 mL/min (by C-G formula based on Cr of 1.2).    Assessment: 83 YOM with history of Afib to continue on IV heparin while Coumadin is on hold.  Heparin level therapeutic and toward the high end of normal.  No bleeding reported.  Patient to start vancomycin for PNA.  His renal function has been fluctuating, now trending down.  His urine output is good.  Vanc 2/19 >>  2/17 endotracheal aspirate cx - S.aureus (preliminary)   Goal of Therapy:  Heparin level 0.3-0.7 units/ml Monitor platelets by anticoagulation protocol: Yes    Plan:  - Decrease heparin gtt slightly to 2100 units/hr - Daily HL and CBC - Vanc 2gm IV x 1, then 759m IV Q12H - Monitor renal fxn, micro data, vanc trough at Css - BMET in AM    TPickeringD. DMina Marble PharmD, BCPS Pager:  [phone]/19/2016, 11:21 AM

## 2014-02-21 NOTE — Progress Notes (Signed)
Pt extubated to BIPAP per Dr. Elsworth Soho. Going to attempt BIPAP for 4-6 hours and then transition to BIPAP QHS. Pt is tolerating BIPAP at this time. RT will continue to monitor.

## 2014-02-21 NOTE — Progress Notes (Signed)
Patient taken off of Bipap and placed on 6L nasal cannula, currently tolerating well.  Sats currently 98%.  Vitals are stable.  Will continue to monitor.

## 2014-02-21 NOTE — Progress Notes (Signed)
PULMONARY / CRITICAL CARE MEDICINE   Name: MCCRAE SPECIALE MRN: [id number] DOB: 12/14/1950    ADMISSION DATE:  02/10/2014 CONSULTATION DATE:  02/12/14  REFERRING MD :  Dr. Candiss Norse   CHIEF COMPLAINT:  Acute Respiratory Failure / AMS   INITIAL PRESENTATION:   64 y/o M with a PMH of Afib, HLD, DM, OSA on CPAP, dCHF who presented 2/8 from [place] with SOB, weight gain, non productive cough and orthopnea. Hypoxic on presentation with sats 70's.  Initially admitted by [facility] with decompensated CHF and AKI.  Attempted diuresis and bipap, but pt refused bipap.  On 2/10, he decompensated with worsening of hypercarbic respiratory failure and was emergently transferred to ICU for intubation.  Extubated 2/14. Re-intubated 2/16.   SIGNIFICANT EVENTS: 2/08  Admit with AKI / decompensated CHF 2/10  Decompensated on floor with hypercarbic respiratory failure, to ICU for emergent intubation.  02/13/14 - on levophed, heparin gtt and fent gtt with versed prn. Creat better. On vent. ECHO with LVEF 35%, SCVO2 77%  2/13 cards consult 2/14 RVR, extubated 2/16 Re-intubated for respiratory distress, inability to tolerate BiPAP.  STUDIES:  2D echo 2/10>>> EF 35-40%,  Diffuse hypokinesis, mild TR, normal PASP    SUBJECTIVE/OVERNIGHT/INTERVAL HX Awake on low-dose fentanyl drip Diuresed 5 L last 24 hours Rapid heart rate on wakeup assessment  VITAL SIGNS: Temp:  [98.3 F (36.8 C)-99.3 F (37.4 C)] 99.3 F (37.4 C) (02/19 0340) Pulse Rate:  [58-105] 98 (02/19 0400) Resp:  [14-33] 15 (02/19 0400) BP: (99-132)/(59-78) 117/67 mmHg (02/19 0400) SpO2:  [95 %-100 %] 99 % (02/19 0400) FiO2 (%):  [40 %] 40 % (02/19 0331) Weight:  [262 lb 2 oz (118.9 kg)] 262 lb 2 oz (118.9 kg) (02/19 0454)   HEMODYNAMICS:     VENTILATOR SETTINGS: Vent Mode:  [-] PRVC FiO2 (%):  [40 %] 40 % Set Rate:  [15 bmp] 15 bmp Vt Set:  [510 mL] 510 mL PEEP:  [5 cmH20] 5 cmH20 Pressure Support:  [5 cmH20] 5  cmH20 Plateau Pressure:  [13 cmH20-18 cmH20] 18 cmH20   INTAKE / OUTPUT:  Intake/Output Summary (Last 24 hours) at 02/21/14 0644 Last data filed at 02/21/14 0400  Gross per 24 hour  Intake 1112.54 ml  Output   4975 ml  Net -3862.46 ml    PHYSICAL EXAMINATION: General:  Morbidly obese male, in no distress Neuro:  Nonfocal ,RASS 0 HEENT:  [place]/AT Cardiovascular:  Regular rate, irregular rhythm, no M/R/G. Lungs: Decreased breath sounds on left, no wheezing Abdomen:  Obese, soft, bs active  Musculoskeletal:  No acute deformities Skin:  Warm, 2+ BLE pitting edema  LABS: PULMONARY  Recent Labs Lab 02/19/14 0103 02/19/14 0500 02/20/14 1529  PHART 7.287* 7.386 7.380  PCO2ART 60.6* 48.0* 58.3*  PO2ART 373.0* 65.2* 103.0*  HCO3 28.0* 28.2* 34.5*  TCO2 29.9 29.7 36  O2SAT 99.9 91.2 98.0   CBC  Recent Labs Lab 02/19/14 0423 02/20/14 0500 02/21/14 0500  HGB 9.8* 10.2* 10.6*  HCT 35.9* 35.4* 39.1  WBC 8.7 9.1 6.7  PLT 238 224 235   COAGULATION  Recent Labs Lab 02/15/14 0350 02/16/14 0400 02/17/14 0350 02/18/14 0600 02/19/14 0534  INR 1.36 1.30 1.33 1.31 1.24   CARDIAC No results for input(s): TROPONINI in the last 168 hours. No results for input(s): PROBNP in the last 168 hours.  CHEMISTRY  Recent Labs Lab 02/14/14 2300 02/15/14 0350 02/16/14 0400 02/17/14 0350 02/18/14 0600 02/19/14 0423 02/19/14 2130 02/20/14 1030 02/21/14 0500  NA 144 145 148* 147* 143 137 139 142 144  K 4.4 4.0 4.4 4.5 4.7 5.2* 4.5 3.7 3.8  CL 103 104 108 105 103 101 104 102 103  CO2 36* 30 35* 35* 30 32 32 34* 35*  GLUCOSE 186* 161* 187* 225* 159* 168* 256* 161* 130*  BUN 47* 46* 37* 41* 47* 44* 48* 47* 51*  CREATININE 1.28 1.21 1.15 1.18 1.35 1.43* 1.24 1.28 1.20  CALCIUM 9.1 8.9 9.7 9.4 9.2 9.0 8.9 9.3 9.1  MG 2.3 2.2 2.2 2.3  --  2.1  --   --   --   PHOS 4.6 4.1 3.6 5.6*  --  4.7*  --   --   --    Estimated Creatinine Clearance: 77.5 mL/min (by C-G formula based on Cr  of 1.2).  LIVER  Recent Labs Lab 02/15/14 0350 02/16/14 0400 02/17/14 0350 02/18/14 0600 02/19/14 0534  INR 1.36 1.30 1.33 1.31 1.24   INFECTIOUS  Recent Labs Lab 02/15/14 0350  PROCALCITON <0.10   ENDOCRINE CBG (last 3)   Recent Labs  02/20/14 1950 02/21/14 0037 02/21/14 0338  GLUCAP 142* 180* 133*   IMAGING x48h Dg Chest Port 1 View  02/20/2014   CLINICAL DATA:  Endotracheal tube  EXAM: PORTABLE CHEST - 1 VIEW  COMPARISON:  02/19/2014  FINDINGS: The endotracheal tube tip is approximately 1.8 cm above the carina. Nasogastric tube extends into the stomach and off the inferior edge of the image There is a left jugular central line extending into the SVC. Airspace opacities persist bilaterally without significant interval change. There are effusions, left greater than right.  IMPRESSION: Probable effusions, and unchanged multifocal airspace opacities.   Electronically Signed   By: Andreas Newport M.D.   On: 02/20/2014 03:57   Portable Chest Xray  02/19/2014   CLINICAL DATA:  Initial in contour for intubation  EXAM: PORTABLE CHEST - 1 VIEW  COMPARISON:  12:33 a.m.  FINDINGS: Endotracheal tube again projects about 1.5 cm above the carinal. Cardiac enlargement is stable. NG tube is unchanged. Left central line is unchanged.  There is bilateral perihilar opacity, stable the mildly worse when compared the prior study. Retrocardiac area is not well evaluated but appears demonstrate increasing opacification.  IMPRESSION: Unchanged endotracheal tube. Bilateral perihilar airspace opacity, left worse than right and worse bilaterally when compared to recent prior study. Possibilities include atelectasis and pneumonia. Pulmonary edema is a consideration but felt to be less likely.   Electronically Signed   By: Skipper Cliche M.D.   On: 02/19/2014 08:49    ASSESSMENT / PLAN:  PULMONARY OETT 2/10 >> 2/14 >> 2/16 A: Acute Hypercarbic Respiratory Failure - in the setting of decompensated  dCHF, AKI, untreated OSA OSA - suspect non-compliant with CPAP Acute Pulmonary edema Pleural effusion Former Smoker VAC P:   Extubated to BiPAP Xopenex q2hrs PRN Will need to coordinate a new CPAP machine upon discharge, evidently one at home does not work any longer  CARDIOVASCULAR CVL L IJ TLC 2/10 >>  A:  Acute on chronic systolic CHF Atrial Fibrillation - RVR, chronic, on coumadin - RVR 2/13 New cardiomyopathy - EF now 35-40% HTN P:  Cards following Continue metoprolol, digoxin, ASA. Continue to hold cardizem. Continue heparin gtt per pharmacy Low dose lisinopril Hold outpatient bisoprolol, metolazone  RENAL A:   Acute on Chronic Kidney Disease - improving P:   Lasix 69m BID BMP daily  GASTROINTESTINAL A:   Morbid Obesity GI prophylaxis Nutrition P:  SUP: Pantoprazole. NPO while on BiPAP, then advance TF  HEMATOLOGIC A:   Anemia - recent work up per primary MD in process, hx FOB +  VTE prophylaxis P:  Transfuse per usual ICU guidelines. Heparin gtt (previously on coumadin as outpatient). Monitor CBC  INFECTIOUS A:   No indication of infection P:   Monitor clinically  ENDOCRINE A:   Hyperglycemia Gout P: SSI Hold outpatient allopurinol.  NEUROLOGIC A:   Acute Metabolic Encephalopathy P:   Continue Fentanyl PRN RASS goal: 0 to -1.  Summary - extubated to BiPAP today, no stridor, or feel his worsening was related to acute pulmonary edema-cardiology feels this is not ischemic, he has diuresed 15 L this admission. I expect him to do well. If unfortunately, he gets reintubated will need tracheostomy  FAMILY UPDATES:  Son, ex-wife and patient's friend updated with patient's current status and plan moving forward. All are in understanding of the plan and agree.  The patient is critically ill with multiple organ systems failure and requires high complexity decision making for assessment and support, frequent evaluation and titration of  therapies, application of advanced monitoring technologies and extensive interpretation of multiple databases. Critical Care Time devoted to patient care services described in this note independent of APP time is 35 minutes.    Rigoberto Noel MD 02/21/2014, 6:44 AM

## 2014-02-21 NOTE — Progress Notes (Signed)
       Patient Name: William Flores Date of Encounter: 02/21/2014    SUBJECTIVE:  Extubated    TELEMETRY:  A fib with controlled v rate. Filed Vitals:   02/21/14 1000 02/21/14 1028 02/21/14 1059 02/21/14 1113  BP: 99/63 116/68    Pulse: 79 97 100   Temp:    98.9 F (37.2 C)  TempSrc:    Axillary  Resp: 18  23   Height:      Weight:      SpO2: 100%  97%     Intake/Output Summary (Last 24 hours) at 02/21/14 1131 Last data filed at 02/21/14 1000  Gross per 24 hour  Intake 1073.96 ml  Output   4270 ml  Net -3196.04 ml   LABS: Basic Metabolic Panel:  Recent Labs  02/19/14 0423  02/20/14 1030 02/21/14 0500  NA 137  < > 142 144  K 5.2*  < > 3.7 3.8  CL 101  < > 102 103  CO2 32  < > 34* 35*  GLUCOSE 168*  < > 161* 130*  BUN 44*  < > 47* 51*  CREATININE 1.43*  < > 1.28 1.20  CALCIUM 9.0  < > 9.3 9.1  MG 2.1  --   --   --   PHOS 4.7*  --   --   --   < > = values in this interval not displayed. CBC:  Recent Labs  02/20/14 0500 02/21/14 0500  WBC 9.1 6.7  HGB 10.2* 10.6*  HCT 35.4* 39.1  MCV 80.8 82.8  PLT 224 235   BNP    Component Value Date/Time   BNP 75.9 02/15/2014 0350   BNP 105.5* 11/13/2013 1121    ProBNP    Component Value Date/Time   PROBNP 325.7* 10/28/2013 1746     Radiology/Studies:  Airspace disease with effusions  Physical Exam: Blood pressure 116/68, pulse 100, temperature 98.9 F (37.2 C), temperature source Axillary, resp. rate 23, height 5' 6"  (1.676 m), weight 262 lb 2 oz (118.9 kg), SpO2 97 %. Weight change: -8 lb 6 oz (-3.8 kg)  Wt Readings from Last 3 Encounters:  02/21/14 262 lb 2 oz (118.9 kg)  02/10/14 294 lb 12.8 oz (133.72 kg)  12/19/13 284 lb (128.822 kg)    Irregularly irregular  ASSESSMENT:  1. Respiratory failure is multifactorial, with a component of Acute on chronic systolic heart failure. Also major contribution from sleep apnea, upper airway disease, and possible infection. 2. No evidence of  ischemic heart disease. Cath in 2012 without coronary obstruction. 3. Atrial fib with controlled rate  Plan:  1. Dig and beta blocker for rate control. 2. Diuresis as tolerated by kidney function 3. Dig level subtherapeutic, but rate control is adequate. 4. No need for ischemic workup.  Signed, Larae Grooms S. 02/21/2014, 11:31 AM

## 2014-02-21 NOTE — Procedures (Signed)
Extubation Procedure Note  Patient Details:   Name: William Flores DOB: February 13, 1950 MRN: [id number]   Airway Documentation:     Evaluation  O2 sats: stable throughout Complications: No apparent complications Patient did tolerate procedure well. Bilateral Breath Sounds: Diminished (slight coarse) Suctioning: Airway Yes pt able to vocalize.  Pt extubated at this time. Pt was able to breathe around deflated cuff. Pt was extubated and placed on BIPAP per Dr. Elsworth Soho. No complications. No stridor noted. Prior to placing pt on BIPAP, pt had strong, adequate cough. VS stable at this time. RT will continue to monitor.  Irineo Axon [facility] 02/21/2014, 11:07 AM

## 2014-02-22 DIAGNOSIS — I5033 Acute on chronic diastolic (congestive) heart failure: Secondary | ICD-10-CM | POA: Insufficient documentation

## 2014-02-22 DIAGNOSIS — I5043 Acute on chronic combined systolic (congestive) and diastolic (congestive) heart failure: Principal | ICD-10-CM

## 2014-02-22 LAB — GLUCOSE, CAPILLARY
GLUCOSE-CAPILLARY: 169 mg/dL — AB (ref 70–99)
GLUCOSE-CAPILLARY: 237 mg/dL — AB (ref 70–99)
GLUCOSE-CAPILLARY: 198 mg/dL — AB (ref 70–99)
Glucose-Capillary: 130 mg/dL — ABNORMAL HIGH (ref 70–99)
Glucose-Capillary: 144 mg/dL — ABNORMAL HIGH (ref 70–99)
Glucose-Capillary: 167 mg/dL — ABNORMAL HIGH (ref 70–99)
Glucose-Capillary: 268 mg/dL — ABNORMAL HIGH (ref 70–99)

## 2014-02-22 LAB — CULTURE, RESPIRATORY W GRAM STAIN: Gram Stain: NONE SEEN

## 2014-02-22 LAB — BASIC METABOLIC PANEL
Anion gap: 5 (ref 5–15)
BUN: 36 mg/dL — AB (ref 6–23)
CO2: 36 mmol/L — ABNORMAL HIGH (ref 19–32)
Calcium: 9 mg/dL (ref 8.4–10.5)
Chloride: 104 mmol/L (ref 96–112)
Creatinine, Ser: 1.07 mg/dL (ref 0.50–1.35)
GFR calc Af Amer: 84 mL/min — ABNORMAL LOW (ref >90)
GFR, EST NON AFRICAN AMERICAN: 72 mL/min — AB (ref >90)
Glucose, Bld: 167 mg/dL — ABNORMAL HIGH (ref 70–99)
POTASSIUM: 3.4 mmol/L — AB (ref 3.5–5.1)
SODIUM: 145 mmol/L (ref 135–145)

## 2014-02-22 LAB — PROCALCITONIN

## 2014-02-22 LAB — CBC
HCT: 39 % (ref 39.0–52.0)
HEMOGLOBIN: 10.8 g/dL — AB (ref 13.0–17.0)
MCH: 23.2 pg — ABNORMAL LOW (ref 26.0–34.0)
MCHC: 27.7 g/dL — ABNORMAL LOW (ref 30.0–36.0)
MCV: 83.7 fL (ref 78.0–100.0)
PLATELETS: 253 10*3/uL (ref 150–400)
RBC: 4.66 MIL/uL (ref 4.22–5.81)
RDW: 21.4 % — ABNORMAL HIGH (ref 11.5–15.5)
WBC: 8.3 10*3/uL (ref 4.0–10.5)

## 2014-02-22 LAB — HEPARIN LEVEL (UNFRACTIONATED): HEPARIN UNFRACTIONATED: 0.54 [IU]/mL (ref 0.30–0.70)

## 2014-02-22 LAB — CULTURE, RESPIRATORY

## 2014-02-22 LAB — CLOSTRIDIUM DIFFICILE BY PCR: CDIFFPCR: NEGATIVE

## 2014-02-22 MED ORDER — PANTOPRAZOLE SODIUM 40 MG PO TBEC
40.0000 mg | DELAYED_RELEASE_TABLET | Freq: Every day | ORAL | Status: DC
  Administered 2014-02-22 – 2014-02-26 (×5): 40 mg via ORAL
  Filled 2014-02-22 (×5): qty 1

## 2014-02-22 MED ORDER — METOPROLOL TARTRATE 1 MG/ML IV SOLN
5.0000 mg | INTRAVENOUS | Status: AC
  Administered 2014-02-22: 5 mg via INTRAVENOUS

## 2014-02-22 MED ORDER — POTASSIUM CHLORIDE 10 MEQ/50ML IV SOLN
10.0000 meq | INTRAVENOUS | Status: AC
  Administered 2014-02-22 (×3): 10 meq via INTRAVENOUS
  Filled 2014-02-22 (×3): qty 50

## 2014-02-22 MED ORDER — POTASSIUM CHLORIDE CRYS ER 20 MEQ PO TBCR
40.0000 meq | EXTENDED_RELEASE_TABLET | Freq: Every day | ORAL | Status: DC
Start: 2014-02-22 — End: 2014-02-26
  Administered 2014-02-22 – 2014-02-26 (×5): 40 meq via ORAL
  Filled 2014-02-22 (×5): qty 2

## 2014-02-22 MED ORDER — DEXAMETHASONE SODIUM PHOSPHATE 4 MG/ML IJ SOLN
4.0000 mg | Freq: Two times a day (BID) | INTRAMUSCULAR | Status: AC
Start: 2014-02-22 — End: 2014-02-22
  Administered 2014-02-22 (×2): 4 mg via INTRAVENOUS
  Filled 2014-02-22 (×3): qty 1

## 2014-02-22 MED ORDER — METOPROLOL TARTRATE 1 MG/ML IV SOLN
5.0000 mg | INTRAVENOUS | Status: DC
  Administered 2014-02-22 – 2014-02-23 (×6): 5 mg via INTRAVENOUS
  Filled 2014-02-22 (×5): qty 5

## 2014-02-22 NOTE — Progress Notes (Signed)
BiPAP is in room incase pt needs during the night. Pt is constantly coughing and does not want to be put on BiPAP at this time. Pt is on nasal cannula and tolerating well at this time

## 2014-02-22 NOTE — Progress Notes (Signed)
Pt has bloody oral secretions and red tinge to urine; Hb stable this am and Heparin is therapeutic. Warren Lacy MD made aware.

## 2014-02-22 NOTE — Progress Notes (Signed)
[place] Progress Note Patient Name: William Flores DOB: [birth year] MRN: [id number]   Date of Service  02/22/2014  HPI/Events of Note  Potassium 3.4  eICU Interventions  replaced     Intervention Category Minor Interventions: Electrolytes abnormality - evaluation and management  Mauri [place], P 02/22/2014, 5:28 AM

## 2014-02-22 NOTE — Progress Notes (Addendum)
ANTICOAGULATION CONSULT NOTE - Follow Up Consult  Pharmacy Consult for Heparin Indication: atrial fibrillation  Allergies  Allergen Reactions  . Codeine     Headache     Patient Measurements: Height: 5' 6"  (167.6 cm) Weight: 253 lb 4.9 oz (114.9 kg) IBW/kg (Calculated) : 63.8 Heparin Dosing Weight: 91 kg  Vital Signs: Temp: 98.9 F (37.2 C) (02/20 1128) Temp Source: Oral (02/20 1128) BP: 139/75 mmHg (02/20 1128) Pulse Rate: 113 (02/20 1128)  Labs:  Recent Labs  02/20/14 0500 02/20/14 1030 02/21/14 0500 02/22/14 0400  HGB 10.2*  --  10.6* 10.8*  HCT 35.4*  --  39.1 39.0  PLT 224  --  235 253  HEPARINUNFRC 0.58  --  0.60 0.54  CREATININE  --  1.28 1.20 1.07    Estimated Creatinine Clearance: 85.2 mL/min (by C-G formula based on Cr of 1.07).   Assessment: CC: 2/8 with dyspnea, abd distention, LE edema, 10lb wt gain: acute on chronic CHF  AC: Coumadin PTA for Afib (Coumadin 7.81m daily except 161mMon / Fri) >> IV heparin. Coumadin still held. HL 0.54. CBC stable.  Goal of Therapy:  Heparin level 0.3-0.7 units/ml Monitor platelets by anticoagulation protocol: Yes   Plan:  Continue IV heparin at 2100 units/hr Daily HL and CBC.   Banjamin Stovall S. RoAlford HighlandPharmD, BCPS Clinical Staff Pharmacist Pager [phone] Ghazitillinger 02/22/2014,11:56 AM

## 2014-02-22 NOTE — Progress Notes (Signed)
SUBJECTIVE:  No complaints  OBJECTIVE:   Vitals:   Filed Vitals:   02/22/14 0736 02/22/14 0755 02/22/14 0800 02/22/14 0900  BP:  128/64 129/77 129/73  Pulse:  99 109 103  Temp: 98.6 F (37 C)     TempSrc: Oral     Resp:   14 23  Height:      Weight:      SpO2:  98% 98% 97%   I&O's:   Intake/Output Summary (Last 24 hours) at 02/22/14 1008 Last data filed at 02/22/14 0701  Gross per 24 hour  Intake 871.91 ml  Output   2750 ml  Net -1878.09 ml   TELEMETRY: Reviewed telemetry pt in atrial fibrillation with RVR HR 110-120's:     PHYSICAL EXAM General: Well developed, well nourished, in no acute distress Lungs:   Clear bilaterally to auscultation anteriorly Heart:   Irregularly irregular and tachy S1 S2 Pulses are 2+ & equal. Abdomen: Bowel sounds are positive, abdomen soft and non-tender without masses Extremities:   No clubbing, cyanosis or edema.  DP +1 Neuro: Alert and oriented X 3. Psych:  Good affect, responds appropriately   LABS: Basic Metabolic Panel:  Recent Labs  02/21/14 0500 02/22/14 0400  NA 144 145  K 3.8 3.4*  CL 103 104  CO2 35* 36*  GLUCOSE 130* 167*  BUN 51* 36*  CREATININE 1.20 1.07  CALCIUM 9.1 9.0   Liver Function Tests: No results for input(s): AST, ALT, ALKPHOS, BILITOT, PROT, ALBUMIN in the last 72 hours. No results for input(s): LIPASE, AMYLASE in the last 72 hours. CBC:  Recent Labs  02/21/14 0500 02/22/14 0400  WBC 6.7 8.3  HGB 10.6* 10.8*  HCT 39.1 39.0  MCV 82.8 83.7  PLT 235 253   Cardiac Enzymes: No results for input(s): CKTOTAL, CKMB, CKMBINDEX, TROPONINI in the last 72 hours. BNP: Invalid input(s): POCBNP D-Dimer: No results for input(s): DDIMER in the last 72 hours. Hemoglobin A1C: No results for input(s): HGBA1C in the last 72 hours. Fasting Lipid Panel: No results for input(s): CHOL, HDL, LDLCALC, TRIG, CHOLHDL, LDLDIRECT in the last 72 hours. Thyroid Function Tests: No results for input(s): TSH,  T4TOTAL, T3FREE, THYROIDAB in the last 72 hours.  Invalid input(s): FREET3 Anemia Panel: No results for input(s): VITAMINB12, FOLATE, FERRITIN, TIBC, IRON, RETICCTPCT in the last 72 hours. Coag Panel:   Lab Results  Component Value Date   INR 1.24 02/19/2014   INR 1.31 02/18/2014   INR 1.33 02/17/2014    RADIOLOGY: Dg Chest 2 View  02/10/2014   CLINICAL DATA:  Shortness of breath and decreased oxygen saturations.  EXAM: CHEST  2 VIEW  COMPARISON:  10/28/2013 and 08/05/2013  FINDINGS: Chronic pleural thickening along both sides of the chest. Heart size is upper limits of normal but unchanged. The trachea is midline. Concern for small pleural effusions on the lateral view. Prominent lung markings suggest vascular congestion or mild edema. Trachea is midline.  IMPRESSION: Vascular congestion or mild interstitial edema.  Concern for small pleural effusions.   Electronically Signed   By: Markus Daft M.D.   On: 02/10/2014 14:16   Dg Chest 2 View  02/10/2014   CLINICAL DATA:  Cough and shortness of breath for 2 months.  EXAM: CHEST  2 VIEW  COMPARISON:  PA and lateral chest 08/05/2013, 02/20/2006 and 02/10/2014.  FINDINGS: Small chronic bilateral pleural effusions versus pleural thickening along the chest walls appear unchanged. There is cardiomegaly. Heart size is upper normal. No  pneumothorax or pleural effusion.  IMPRESSION: No acute disease.   Electronically Signed   By: Inge Rise M.D.   On: 02/10/2014 13:49   Dg Chest Port 1 View  02/20/2014   CLINICAL DATA:  Endotracheal tube  EXAM: PORTABLE CHEST - 1 VIEW  COMPARISON:  02/19/2014  FINDINGS: The endotracheal tube tip is approximately 1.8 cm above the carina. Nasogastric tube extends into the stomach and off the inferior edge of the image There is a left jugular central line extending into the SVC. Airspace opacities persist bilaterally without significant interval change. There are effusions, left greater than right.  IMPRESSION: Probable  effusions, and unchanged multifocal airspace opacities.   Electronically Signed   By: Andreas Newport M.D.   On: 02/20/2014 03:57   Portable Chest Xray  02/19/2014   CLINICAL DATA:  Initial in contour for intubation  EXAM: PORTABLE CHEST - 1 VIEW  COMPARISON:  12:33 a.m.  FINDINGS: Endotracheal tube again projects about 1.5 cm above the carinal. Cardiac enlargement is stable. NG tube is unchanged. Left central line is unchanged.  There is bilateral perihilar opacity, stable the mildly worse when compared the prior study. Retrocardiac area is not well evaluated but appears demonstrate increasing opacification.  IMPRESSION: Unchanged endotracheal tube. Bilateral perihilar airspace opacity, left worse than right and worse bilaterally when compared to recent prior study. Possibilities include atelectasis and pneumonia. Pulmonary edema is a consideration but felt to be less likely.   Electronically Signed   By: Skipper Cliche M.D.   On: 02/19/2014 08:49   Portable Chest Xray  02/19/2014   CLINICAL DATA:  Shortness of breath.  Status post intubation.  EXAM: PORTABLE CHEST - 1 VIEW  COMPARISON:  Single view of the chest 02/16/2014.  FINDINGS: The patient is rotated on this study. Endotracheal tube is in place with the tip approximately 1.5 cm above the carina. Left IJ catheter and NG tube are unchanged. Left worse than right airspace disease persists and also appears unchanged. There is likely a left pleural effusion. Pleural thickening or fluid along the right chest wall is noted.  IMPRESSION: ET tube tip projects 1.5 cm above the carina.  No change in left worse right basilar airspace disease.   Electronically Signed   By: Inge Rise M.D.   On: 02/19/2014 00:53   Dg Chest Portable 1 View  02/16/2014   CLINICAL DATA:  Respiratory failure.  EXAM: PORTABLE CHEST - 1 VIEW  COMPARISON:  02/15/2014  FINDINGS: Endotracheal tube is in place with tip 5.8 cm above carina. Nasogastric tube is in place with tip be  on the film but be on the gastroesophageal junction. Left IJ central line tip overlies the superior vena cava.  The heart is enlarged. There is patchy density at both lung bases consistent with atelectasis or infiltrate. Suspect bilateral small effusions.  IMPRESSION: 1. Lines and tubes as described. 2. Persistent bibasilar atelectasis or infiltrates.   Electronically Signed   By: Nolon Nations M.D.   On: 02/16/2014 08:31   Dg Chest Port 1 View  02/15/2014   CLINICAL DATA:  Respiratory failure  EXAM: PORTABLE CHEST - 1 VIEW  COMPARISON:  Chest x-ray from 2 days prior.  FINDINGS: Endotracheal tube is in stable position, tip at the clavicular heads. Left IJ catheter, tip near the distal left brachiocephalic vein. The gastric suction tube is in good position.  Improved aeration, likely resolved pulmonary edema. There are bilateral pleural effusions, at least moderate on the right with layering.  Focal right basilar opacity. No air leak.  Stable cardiomegaly and upper mediastinal widening.  IMPRESSION: 1. Resolved pulmonary edema. Right more than left layering pleural effusions persist. 2. Focal opacity at the right base which could reflect pneumonia or atelectasis. 3. Stable positioning of tubes and central line.   Electronically Signed   By: Monte Fantasia M.D.   On: 02/15/2014 08:14   Portable Chest Xray  02/13/2014   CLINICAL DATA:  Sleep apnea.  Shortness of breath.  EXAM: PORTABLE CHEST - 1 VIEW  COMPARISON:  02/12/2014.  FINDINGS: Endotracheal tube, left IJ line, NG tube in stable position. Cardiomegaly with bilateral pulmonary alveolar infiltrates. Bilateral effusions. No pneumothorax. No acute bony abnormality.  IMPRESSION: 1. Lines and tubes in stable position. 2. Congestive heart failure with bilateral pulmonary edema increased from prior exams. Bilateral small pleural effusions.   Electronically Signed   By: Marcello Moores  Register   On: 02/13/2014 07:31   Dg Chest Port 1 View  02/12/2014   CLINICAL  DATA:  64 year old male currently admitted. Evaluate central line placement.  EXAM: PORTABLE CHEST - 1 VIEW  COMPARISON:  Prior chest x-ray 02/12/2014  FINDINGS: New left IJ approach central venous catheter. Catheter tip overlies the junction of the left innominate vein and the superior vena cava. Limited evaluation of the left hemi thorax. No pneumothorax. Bilateral pleural effusions, pulmonary edema and dense left retrocardiac opacity are all similar.  IMPRESSION: 1. The tip of the new left IJ approach central venous catheter overlies the junction of the left innominate vein and the SVC. 2. No definite pneumothorax although evaluation of the left chest is very limited given patient positioning.   Electronically Signed   By: Jacqulynn Cadet M.D.   On: 02/12/2014 10:33   Portable Chest Xray  02/12/2014   CLINICAL DATA:  Obstructive sleep apnea  EXAM: PORTABLE CHEST - 1 VIEW  COMPARISON:  02/10/2014  FINDINGS: Low volumes. Endotracheal tube in place with its tip 5.8 cm from the carina. Multi focal bilateral patchy airspace opacities. Small right pleural effusion.  IMPRESSION: Endotracheal tube 5.8 cm from the carina  CHF.   Electronically Signed   By: Marybelle Killings M.D.   On: 02/12/2014 09:31   Dg Abd Portable 1v  02/19/2014   CLINICAL DATA:  OG tube placement.  EXAM: PORTABLE ABDOMEN - 1 VIEW  COMPARISON:  None.  FINDINGS: OG tube is in place with the tip in the distal stomach. The stomach appears mildly distended.  IMPRESSION: As above.   Electronically Signed   By: Inge Rise M.D.   On: 02/19/2014 00:53   Dg Abd Portable 1v  02/12/2014   CLINICAL DATA:  Orogastric tube placement  EXAM: PORTABLE ABDOMEN - 1 VIEW  COMPARISON:  10/28/2013  FINDINGS: An orogastric tube is coiled in the stomach. No incidental acute findings, the bowel gas pattern is nonobstructive.  IMPRESSION: Orogastric tube tip in the upper stomach.   Electronically Signed   By: Monte Fantasia M.D.   On: 02/12/2014 12:52     ASSESSMENT:  1. Respiratory failure is multifactorial, with a component of Acute on chronic systolic heart failure. Also major contribution from sleep apnea, upper airway disease, and possible infection. 2. No evidence of ischemic heart disease. Cath in 2012 without coronary obstruction. 3. Atrial fib with poorly controlled HR.   4.  Hypokalemia  Plan:  1. Dig and beta blocker for rate control.  Will increase Lopressor to 72m q4 hours IV to get better rate control.  2. Diuresis as tolerated by kidney function 3. No need for ischemic workup. 4. Replete potassium   Sueanne Margarita, MD  02/22/2014  10:08 AM

## 2014-02-22 NOTE — Progress Notes (Signed)
PULMONARY / CRITICAL CARE MEDICINE   Name: William Flores MRN: [id number] DOB: 1950/08/23    ADMISSION DATE:  02/10/2014 CONSULTATION DATE:  02/12/14  REFERRING MD :  Dr. Candiss Norse   CHIEF COMPLAINT:  Acute Respiratory Failure / AMS   INITIAL PRESENTATION:   64 y/o M with a PMH of Afib, HLD, DM, OSA on CPAP, dCHF who presented 2/8 from [facility] with SOB, weight gain, non productive cough and orthopnea. Hypoxic on presentation with sats 70's.  Initially admitted by [facility] with decompensated CHF and AKI.  Attempted diuresis and bipap, but pt refused bipap.  On 2/10, he decompensated with worsening of hypercarbic respiratory failure and was emergently transferred to ICU for intubation.  Extubated 2/14. Re-intubated 2/16.  SIGNIFICANT EVENTS: 2/08  Admit with AKI / decompensated CHF 2/10  Decompensated on floor with hypercarbic respiratory failure, to ICU for emergent intubation.  02/13/14 - on levophed, heparin gtt and fent gtt with versed prn. Creat better. On vent. ECHO with LVEF 35%, SCVO2 77%  2/13 cards consult 2/14 RVR, extubated 2/16 Re-intubated for respiratory distress, inability to tolerate BiPAP.  STUDIES:  2D echo 2/10>>> EF 35-40%,  Diffuse hypokinesis, mild TR, normal PASP    SUBJECTIVE/OVERNIGHT/INTERVAL HX Diuresed 3.2L yesterday; total -17L Tolerated bipap overnight Currently on [place] @5L  Staph aureus in tracheal aspirate Multiple watery stools overnight  VITAL SIGNS: Temp:  [97.8 F (36.6 C)-99.5 F (37.5 C)] 98.8 F (37.1 C) (02/20 0338) Pulse Rate:  [79-129] 101 (02/20 0500) Resp:  [15-25] 22 (02/20 0500) BP: (99-150)/(59-101) 128/67 mmHg (02/20 0500) SpO2:  [95 %-100 %] 100 % (02/20 0500) FiO2 (%):  [40 %] 40 % (02/19 1518) Weight:  [253 lb 4.9 oz (114.9 kg)] 253 lb 4.9 oz (114.9 kg) (02/20 0453)   HEMODYNAMICS:     VENTILATOR SETTINGS: Vent Mode:  [-] BIPAP FiO2 (%):  [40 %] 40 % Set Rate:  [8 bmp] 8 bmp PEEP:  [5 cmH20-6 cmH20] 6  cmH20 Pressure Support:  [5 cmH20] 5 cmH20   INTAKE / OUTPUT:  Intake/Output Summary (Last 24 hours) at 02/22/14 4008 Last data filed at 02/22/14 0603  Gross [facility] 24 hour  Intake 920.29 ml  Output   3270 ml  Net -2349.71 ml    PHYSICAL EXAMINATION: General:  Morbidly obese male, in no distress Neuro:  Nonfocal ,RASS 0 HEENT:  [place]/AT Cardiovascular:  Regular rate, irregular rhythm, no M/R/G. Lungs: Decreased breath sounds on left, wheezing in right lung diffusely Abdomen:  Obese, soft, bs active  Musculoskeletal:  No acute deformities Skin:  Warm, 2+ BLE pitting edema  LABS: PULMONARY  Recent Labs Lab 02/19/14 0103 02/19/14 0500 02/20/14 1529  PHART 7.287* 7.386 7.380  PCO2ART 60.6* 48.0* 58.3*  PO2ART 373.0* 65.2* 103.0*  HCO3 28.0* 28.2* 34.5*  TCO2 29.9 29.7 36  O2SAT 99.9 91.2 98.0   CBC  Recent Labs Lab 02/20/14 0500 02/21/14 0500 02/22/14 0400  HGB 10.2* 10.6* 10.8*  HCT 35.4* 39.1 39.0  WBC 9.1 6.7 8.3  PLT 224 235 253   COAGULATION  Recent Labs Lab 02/16/14 0400 02/17/14 0350 02/18/14 0600 02/19/14 0534  INR 1.30 1.33 1.31 1.24   CARDIAC No results for input(s): TROPONINI in the last 168 hours. No results for input(s): PROBNP in the last 168 hours.  CHEMISTRY  Recent Labs Lab 02/16/14 0400 02/17/14 0350  02/19/14 0423 02/19/14 2130 02/20/14 1030 02/21/14 0500 02/22/14 0400  NA 148* 147*  < > 137 139 142 144 145  K  4.4 4.5  < > 5.2* 4.5 3.7 3.8 3.4*  CL 108 105  < > 101 104 102 103 104  CO2 35* 35*  < > 32 32 34* 35* 36*  GLUCOSE 187* 225*  < > 168* 256* 161* 130* 167*  BUN 37* 41*  < > 44* 48* 47* 51* 36*  CREATININE 1.15 1.18  < > 1.43* 1.24 1.28 1.20 1.07  CALCIUM 9.7 9.4  < > 9.0 8.9 9.3 9.1 9.0  MG 2.2 2.3  --  2.1  --   --   --   --   PHOS 3.6 5.6*  --  4.7*  --   --   --   --   < > = values in this interval not displayed. Estimated Creatinine Clearance: 85.2 mL/min (by C-G formula based on Cr of 1.07).  LIVER  Recent  Labs Lab 02/16/14 0400 02/17/14 0350 02/18/14 0600 02/19/14 0534  INR 1.30 1.33 1.31 1.24   INFECTIOUS No results for input(s): LATICACIDVEN, PROCALCITON in the last 168 hours. ENDOCRINE CBG (last 3)   Recent Labs  02/21/14 1937 02/22/14 0004 02/22/14 0334  GLUCAP 161* 167* 144*   IMAGING x48h No results found.  ASSESSMENT / PLAN:  PULMONARY OETT 2/10 >> 2/14 >> 2/16>>2/19 A: Acute Hypercarbic Respiratory Failure - in the setting of decompensated dCHF, AKI, untreated OSA OSA - suspect non-compliant with CPAP Acute Pulmonary edema Pleural effusion Former Smoker VAC Stridor s/p extubation P:   Extubated to BiPAP successfully; continue at night Xopenex q2hrs PRN Will need to coordinate a new CPAP machine upon discharge, evidently one at home does not work any longer Neg balance goals remain  CARDIOVASCULAR CVL L IJ TLC 2/10 >>  A:  Acute on chronic systolic CHF Atrial Fibrillation - RVR, chronic, on coumadin - RVR 2/13 New cardiomyopathy - EF now 35-40% HTN P:  Cards following Continue metoprolol, digoxin, ASA. Continue to hold cardizem. Continue heparin gtt [facility] pharmacy, in am to coumadin if resp status remains good Continue lisinopril Hold outpatient bisoprolol, metolazone  RENAL A:   Acute on Chronic Kidney Disease - improving P:   Lasix 56m BID BMP daily  GASTROINTESTINAL A:   Morbid Obesity GI prophylaxis Nutrition P:   SUP: Pantoprazole. cdiff sent Start diet  HEMATOLOGIC A:   Anemia - recent work up William primary MD in process, hx FOB +. Stable VTE prophylaxis P:  Transfuse William usual ICU guidelines. Heparin gtt (previously on coumadin as outpatient) - in am goal to add coumadin overlap Monitor CBC  INFECTIOUS A:   Staph aureus positive tracheal aspirate NO fevers, clinically with failure  Unclear if pathogen P:   C. Diff 2/20>> Vancomycin 2/19>> Awaiting culture sensitivities Pct algo to dc abx in 48 hr if all neg and  clinical status good Clinically not suspicion infection active  ENDOCRINE A:   Hyperglycemia Gout P: SSI Hold outpatient allopurinol.  NEUROLOGIC A:   Acute Metabolic Encephalopathy P:   Continue Fentanyl PRN RASS goal: 0 to -1.  Summary - Extubated to BiPAP yesterday, stridor today with hoarseness. Concern for C. Difficile colitis.   FAMILY UPDATES:  Son, ex-wife and patient's friend updated 2/18 with patient's current status and plan moving forward. All are in understanding of the plan and agree.   RCordelia Poche MD PGY-2, [facility] 02/22/2014, 7:13 AM   STAFF NOTE: ILinwood Dibbles MD FACP have personally reviewed patient's available data, including medical history, events of note, physical examination and  test results as part of my evaluation. I have discussed with resident/NP and other care providers such as pharmacist, RN and RRT. In addition, I personally evaluated patient and elicited key findings of: no distress, pcxr last c/w failure, maintain lasix, to sdu, PCT, may be able to dc all abx, unsure if MRSa is pathogen, add  diet  William Flores. [name], MD, [place] Pgr: [facility] 02/22/2014 9:02 AM

## 2014-02-22 NOTE — Progress Notes (Signed)
Pt taken off Bipap per pt request. Pt placed on 6L N/C. Pt tolerating well. RT will continue to monitor.

## 2014-02-23 ENCOUNTER — Inpatient Hospital Stay (HOSPITAL_COMMUNITY): Payer: BLUE CROSS/BLUE SHIELD

## 2014-02-23 DIAGNOSIS — E785 Hyperlipidemia, unspecified: Secondary | ICD-10-CM

## 2014-02-23 DIAGNOSIS — D500 Iron deficiency anemia secondary to blood loss (chronic): Secondary | ICD-10-CM

## 2014-02-23 LAB — CBC
HCT: 43.4 % (ref 39.0–52.0)
Hemoglobin: 11.9 g/dL — ABNORMAL LOW (ref 13.0–17.0)
MCH: 23.2 pg — ABNORMAL LOW (ref 26.0–34.0)
MCHC: 27.4 g/dL — AB (ref 30.0–36.0)
MCV: 84.8 fL (ref 78.0–100.0)
Platelets: 272 10*3/uL (ref 150–400)
RBC: 5.12 MIL/uL (ref 4.22–5.81)
RDW: 20.9 % — ABNORMAL HIGH (ref 11.5–15.5)
WBC: 10.1 10*3/uL (ref 4.0–10.5)

## 2014-02-23 LAB — BASIC METABOLIC PANEL
Anion gap: 4 — ABNORMAL LOW (ref 5–15)
BUN: 35 mg/dL — ABNORMAL HIGH (ref 6–23)
CO2: 36 mmol/L — ABNORMAL HIGH (ref 19–32)
Calcium: 9.4 mg/dL (ref 8.4–10.5)
Chloride: 103 mmol/L (ref 96–112)
Creatinine, Ser: 1.04 mg/dL (ref 0.50–1.35)
GFR calc Af Amer: 87 mL/min — ABNORMAL LOW (ref >90)
GFR calc non Af Amer: 75 mL/min — ABNORMAL LOW (ref >90)
GLUCOSE: 188 mg/dL — AB (ref 70–99)
Potassium: 4.1 mmol/L (ref 3.5–5.1)
SODIUM: 143 mmol/L (ref 135–145)

## 2014-02-23 LAB — GLUCOSE, CAPILLARY
GLUCOSE-CAPILLARY: 171 mg/dL — AB (ref 70–99)
GLUCOSE-CAPILLARY: 204 mg/dL — AB (ref 70–99)
Glucose-Capillary: 105 mg/dL — ABNORMAL HIGH (ref 70–99)
Glucose-Capillary: 147 mg/dL — ABNORMAL HIGH (ref 70–99)
Glucose-Capillary: 154 mg/dL — ABNORMAL HIGH (ref 70–99)
Glucose-Capillary: 182 mg/dL — ABNORMAL HIGH (ref 70–99)

## 2014-02-23 LAB — PROCALCITONIN: Procalcitonin: <0.1 ng/mL

## 2014-02-23 LAB — HEPARIN LEVEL (UNFRACTIONATED): HEPARIN UNFRACTIONATED: 0.7 [IU]/mL (ref 0.30–0.70)

## 2014-02-23 LAB — TSH: TSH: 0.743 u[IU]/mL (ref 0.350–4.500)

## 2014-02-23 MED ORDER — LIDOCAINE HCL 2 % EX GEL
1.0000 "application " | Freq: Four times a day (QID) | CUTANEOUS | Status: DC | PRN
  Administered 2014-02-23: 1 via URETHRAL
  Filled 2014-02-23 (×2): qty 5

## 2014-02-23 MED ORDER — METOPROLOL SUCCINATE ER 25 MG PO TB24
25.0000 mg | ORAL_TABLET | Freq: Two times a day (BID) | ORAL | Status: DC
Start: 2014-02-23 — End: 2014-02-24
  Administered 2014-02-23: 25 mg via ORAL
  Filled 2014-02-23: qty 1

## 2014-02-23 MED ORDER — RISAQUAD PO CAPS
1.0000 | ORAL_CAPSULE | Freq: Every day | ORAL | Status: DC
  Administered 2014-02-23 – 2014-02-26 (×4): 1 via ORAL
  Filled 2014-02-23 (×4): qty 1

## 2014-02-23 MED ORDER — ENOXAPARIN SODIUM 120 MG/0.8ML ~~LOC~~ SOLN
115.0000 mg | Freq: Two times a day (BID) | SUBCUTANEOUS | Status: DC
  Administered 2014-02-23: 115 mg via SUBCUTANEOUS
  Filled 2014-02-23: qty 0.8

## 2014-02-23 MED ORDER — WARFARIN SODIUM 10 MG PO TABS
10.0000 mg | ORAL_TABLET | Freq: Once | ORAL | Status: AC
  Administered 2014-02-23: 10 mg via ORAL
  Filled 2014-02-23: qty 1

## 2014-02-23 MED ORDER — CEPHALEXIN 500 MG PO CAPS
500.0000 mg | ORAL_CAPSULE | Freq: Two times a day (BID) | ORAL | Status: DC
Start: 2014-02-23 — End: 2014-02-24
  Administered 2014-02-23 – 2014-02-24 (×3): 500 mg via ORAL
  Filled 2014-02-23 (×3): qty 1

## 2014-02-23 MED ORDER — ACETAMINOPHEN 325 MG PO TABS
650.0000 mg | ORAL_TABLET | ORAL | Status: DC | PRN
  Administered 2014-02-23: 650 mg via ORAL
  Filled 2014-02-23: qty 2

## 2014-02-23 MED ORDER — FUROSEMIDE 40 MG PO TABS
40.0000 mg | ORAL_TABLET | Freq: Every day | ORAL | Status: DC
  Administered 2014-02-24 – 2014-02-25 (×2): 40 mg via ORAL
  Filled 2014-02-23 (×2): qty 1

## 2014-02-23 MED ORDER — WARFARIN - PHARMACIST DOSING INPATIENT: Freq: Every day | Status: DC

## 2014-02-23 NOTE — Progress Notes (Addendum)
SUBJECTIVE: Denies chest pain. Says breathing is better. Just tired from not getting much sleep.     Intake/Output Summary (Last 24 hours) at 02/23/14 1318 Last data filed at 02/23/14 1116  Gross per 24 hour  Intake    813 ml  Output   2175 ml  Net  -1362 ml    Current Facility-Administered Medications  Medication Dose Route Frequency Provider Last Rate Last Dose  . 0.9 %  sodium chloride infusion   Intravenous Continuous Leota Jacobsen, MD 10 mL/hr at 02/17/14 1020    . acetaminophen (TYLENOL) tablet 650 mg  650 mg Oral Q4H PRN Jules Husbands, MD   650 mg at 02/23/14 0046  . alum & mag hydroxide-simeth (MAALOX/MYLANTA) 200-200-20 MG/5ML suspension 15 mL  15 mL Oral Q6H PRN Kara Mead V, MD   15 mL at 02/17/14 1040  . aspirin chewable tablet 81 mg  81 mg Per Tube Daily Rahul P Desai, PA-C   81 mg at 02/23/14 1031  . digoxin (LANOXIN) tablet 0.125 mg  0.125 mg Per Tube Daily Rahul P Desai, PA-C   0.125 mg at 02/23/14 1031  . furosemide (LASIX) injection 40 mg  40 mg Intravenous BID Rahul P Desai, PA-C   40 mg at 02/23/14 0841  . heparin ADULT infusion 100 units/mL (25000 units/250 mL)  2,100 Units/hr Intravenous Continuous Saundra Shelling, [facility] 21 mL/hr at 02/23/14 0053 2,100 Units/hr at 02/23/14 0053  . insulin aspart (novoLOG) injection 0-20 Units  0-20 Units Subcutaneous 6 times per day Donita Brooks, NP   4 Units at 02/23/14 0841  . ketotifen (ZADITOR) 0.025 % ophthalmic solution 1 drop  1 drop Both Eyes BID Samella Parr, NP   1 drop at 02/23/14 1031  . levalbuterol (XOPENEX) nebulizer solution 0.63 mg  0.63 mg Nebulization Q2H PRN Rahul P Desai, PA-C   0.63 mg at 02/23/14 0108  . lidocaine (XYLOCAINE) 2 % jelly 1 application  1 application Urethral V7B PRN Jules Husbands, MD   1 application at 93/90/30 0101  . lisinopril (PRINIVIL,ZESTRIL) tablet 5 mg  5 mg Oral Daily Olam Idler, MD   5 mg at 02/23/14 1031  . menthol-cetylpyridinium (CEPACOL) lozenge 3 mg  1 lozenge Oral  PRN Eileen Stanford, PA-C   3 mg at 02/18/14 2221  . metoprolol (LOPRESSOR) injection 5 mg  5 mg Intravenous Q4H Sueanne Margarita, MD   5 mg at 02/23/14 1031  . pantoprazole (PROTONIX) EC tablet 40 mg  40 mg Oral Daily Kara Mead V, MD   40 mg at 02/23/14 1031  . potassium chloride SA (K-DUR,KLOR-CON) CR tablet 40 mEq  40 mEq Oral Daily Kara Mead V, MD   40 mEq at 02/23/14 1031  . vancomycin (VANCOCIN) IVPB 750 mg/150 ml premix  750 mg Intravenous Q12H Saundra Shelling, RPH   750 mg at 02/23/14 0052    Filed Vitals:   02/23/14 0108 02/23/14 0400 02/23/14 0840 02/23/14 1144  BP:  105/71 129/71 120/79  Pulse:  108 108 106  Temp:  98.1 F (36.7 C) 98 F (36.7 C) 98.2 F (36.8 C)  TempSrc:  Axillary Axillary Axillary  Resp:  21    Height:      Weight:  255 lb 6.4 oz (115.849 kg)    SpO2: 97% 96% 96% 96%    PHYSICAL EXAM General: NAD HEENT: Normal. Neck: No JVD, no thyromegaly.  Lungs: Clear to auscultation bilaterally with normal  respiratory effort. CV: Irregular rhythm, normal S1/S2, no S3, no murmur. No pretibial edema. Chronic stasis dermatitis of legs b/l.  Abdomen: Soft, obese, no distention.  Neurologic: Alert and oriented x 3.  Psych: Normal affect. Musculoskeletal: No gross deformities. Extremities: No clubbing or cyanosis.   TELEMETRY: Reviewed telemetry pt in atrial fibrillation, HR 90-105 bpm  LABS: Basic Metabolic Panel:  Recent Labs  02/22/14 0400 02/23/14 0423  NA 145 143  K 3.4* 4.1  CL 104 103  CO2 36* 36*  GLUCOSE 167* 188*  BUN 36* 35*  CREATININE 1.07 1.04  CALCIUM 9.0 9.4   Liver Function Tests: No results for input(s): AST, ALT, ALKPHOS, BILITOT, PROT, ALBUMIN in the last 72 hours. No results for input(s): LIPASE, AMYLASE in the last 72 hours. CBC:  Recent Labs  02/22/14 0400 02/23/14 0423  WBC 8.3 10.1  HGB 10.8* 11.9*  HCT 39.0 43.4  MCV 83.7 84.8  PLT 253 272   Cardiac Enzymes: No results for input(s): CKTOTAL, CKMB, CKMBINDEX,  TROPONINI in the last 72 hours. BNP: Invalid input(s): POCBNP D-Dimer: No results for input(s): DDIMER in the last 72 hours. Hemoglobin A1C: No results for input(s): HGBA1C in the last 72 hours. Fasting Lipid Panel: No results for input(s): CHOL, HDL, LDLCALC, TRIG, CHOLHDL, LDLDIRECT in the last 72 hours. Thyroid Function Tests: No results for input(s): TSH, T4TOTAL, T3FREE, THYROIDAB in the last 72 hours.  Invalid input(s): FREET3 Anemia Panel: No results for input(s): VITAMINB12, FOLATE, FERRITIN, TIBC, IRON, RETICCTPCT in the last 72 hours.  RADIOLOGY: Dg Chest 2 View  02/10/2014   CLINICAL DATA:  Shortness of breath and decreased oxygen saturations.  EXAM: CHEST  2 VIEW  COMPARISON:  10/28/2013 and 08/05/2013  FINDINGS: Chronic pleural thickening along both sides of the chest. Heart size is upper limits of normal but unchanged. The trachea is midline. Concern for small pleural effusions on the lateral view. Prominent lung markings suggest vascular congestion or mild edema. Trachea is midline.  IMPRESSION: Vascular congestion or mild interstitial edema.  Concern for small pleural effusions.   Electronically Signed   By: Markus Daft M.D.   On: 02/10/2014 14:16   Dg Chest 2 View  02/10/2014   CLINICAL DATA:  Cough and shortness of breath for 2 months.  EXAM: CHEST  2 VIEW  COMPARISON:  PA and lateral chest 08/05/2013, 02/20/2006 and 02/10/2014.  FINDINGS: Small chronic bilateral pleural effusions versus pleural thickening along the chest walls appear unchanged. There is cardiomegaly. Heart size is upper normal. No pneumothorax or pleural effusion.  IMPRESSION: No acute disease.   Electronically Signed   By: Inge Rise M.D.   On: 02/10/2014 13:49   Dg Chest Port 1 View  02/20/2014   CLINICAL DATA:  Endotracheal tube  EXAM: PORTABLE CHEST - 1 VIEW  COMPARISON:  02/19/2014  FINDINGS: The endotracheal tube tip is approximately 1.8 cm above the carina. Nasogastric tube extends into the  stomach and off the inferior edge of the image There is a left jugular central line extending into the SVC. Airspace opacities persist bilaterally without significant interval change. There are effusions, left greater than right.  IMPRESSION: Probable effusions, and unchanged multifocal airspace opacities.   Electronically Signed   By: Andreas Newport M.D.   On: 02/20/2014 03:57   Portable Chest Xray  02/19/2014   CLINICAL DATA:  Initial in contour for intubation  EXAM: PORTABLE CHEST - 1 VIEW  COMPARISON:  12:33 a.m.  FINDINGS: Endotracheal tube again projects about  1.5 cm above the carinal. Cardiac enlargement is stable. NG tube is unchanged. Left central line is unchanged.  There is bilateral perihilar opacity, stable the mildly worse when compared the prior study. Retrocardiac area is not well evaluated but appears demonstrate increasing opacification.  IMPRESSION: Unchanged endotracheal tube. Bilateral perihilar airspace opacity, left worse than right and worse bilaterally when compared to recent prior study. Possibilities include atelectasis and pneumonia. Pulmonary edema is a consideration but felt to be less likely.   Electronically Signed   By: Skipper Cliche M.D.   On: 02/19/2014 08:49   Portable Chest Xray  02/19/2014   CLINICAL DATA:  Shortness of breath.  Status post intubation.  EXAM: PORTABLE CHEST - 1 VIEW  COMPARISON:  Single view of the chest 02/16/2014.  FINDINGS: The patient is rotated on this study. Endotracheal tube is in place with the tip approximately 1.5 cm above the carina. Left IJ catheter and NG tube are unchanged. Left worse than right airspace disease persists and also appears unchanged. There is likely a left pleural effusion. Pleural thickening or fluid along the right chest wall is noted.  IMPRESSION: ET tube tip projects 1.5 cm above the carina.  No change in left worse right basilar airspace disease.   Electronically Signed   By: Inge Rise M.D.   On: 02/19/2014  00:53   Dg Chest Portable 1 View  02/16/2014   CLINICAL DATA:  Respiratory failure.  EXAM: PORTABLE CHEST - 1 VIEW  COMPARISON:  02/15/2014  FINDINGS: Endotracheal tube is in place with tip 5.8 cm above carina. Nasogastric tube is in place with tip be on the film but be on the gastroesophageal junction. Left IJ central line tip overlies the superior vena cava.  The heart is enlarged. There is patchy density at both lung bases consistent with atelectasis or infiltrate. Suspect bilateral small effusions.  IMPRESSION: 1. Lines and tubes as described. 2. Persistent bibasilar atelectasis or infiltrates.   Electronically Signed   By: Nolon Nations M.D.   On: 02/16/2014 08:31   Dg Chest Port 1 View  02/15/2014   CLINICAL DATA:  Respiratory failure  EXAM: PORTABLE CHEST - 1 VIEW  COMPARISON:  Chest x-ray from 2 days prior.  FINDINGS: Endotracheal tube is in stable position, tip at the clavicular heads. Left IJ catheter, tip near the distal left brachiocephalic vein. The gastric suction tube is in good position.  Improved aeration, likely resolved pulmonary edema. There are bilateral pleural effusions, at least moderate on the right with layering. Focal right basilar opacity. No air leak.  Stable cardiomegaly and upper mediastinal widening.  IMPRESSION: 1. Resolved pulmonary edema. Right more than left layering pleural effusions persist. 2. Focal opacity at the right base which could reflect pneumonia or atelectasis. 3. Stable positioning of tubes and central line.   Electronically Signed   By: Monte Fantasia M.D.   On: 02/15/2014 08:14   Portable Chest Xray  02/13/2014   CLINICAL DATA:  Sleep apnea.  Shortness of breath.  EXAM: PORTABLE CHEST - 1 VIEW  COMPARISON:  02/12/2014.  FINDINGS: Endotracheal tube, left IJ line, NG tube in stable position. Cardiomegaly with bilateral pulmonary alveolar infiltrates. Bilateral effusions. No pneumothorax. No acute bony abnormality.  IMPRESSION: 1. Lines and tubes in stable  position. 2. Congestive heart failure with bilateral pulmonary edema increased from prior exams. Bilateral small pleural effusions.   Electronically Signed   By: Marcello Moores  Register   On: 02/13/2014 07:31   Dg Chest Port 1  View  02/12/2014   CLINICAL DATA:  64 year old male currently admitted. Evaluate central line placement.  EXAM: PORTABLE CHEST - 1 VIEW  COMPARISON:  Prior chest x-ray 02/12/2014  FINDINGS: New left IJ approach central venous catheter. Catheter tip overlies the junction of the left innominate vein and the superior vena cava. Limited evaluation of the left hemi thorax. No pneumothorax. Bilateral pleural effusions, pulmonary edema and dense left retrocardiac opacity are all similar.  IMPRESSION: 1. The tip of the new left IJ approach central venous catheter overlies the junction of the left innominate vein and the SVC. 2. No definite pneumothorax although evaluation of the left chest is very limited given patient positioning.   Electronically Signed   By: Jacqulynn Cadet M.D.   On: 02/12/2014 10:33   Portable Chest Xray  02/12/2014   CLINICAL DATA:  Obstructive sleep apnea  EXAM: PORTABLE CHEST - 1 VIEW  COMPARISON:  02/10/2014  FINDINGS: Low volumes. Endotracheal tube in place with its tip 5.8 cm from the carina. Multi focal bilateral patchy airspace opacities. Small right pleural effusion.  IMPRESSION: Endotracheal tube 5.8 cm from the carina  CHF.   Electronically Signed   By: Marybelle Killings M.D.   On: 02/12/2014 09:31   Dg Abd Portable 1v  02/19/2014   CLINICAL DATA:  OG tube placement.  EXAM: PORTABLE ABDOMEN - 1 VIEW  COMPARISON:  None.  FINDINGS: OG tube is in place with the tip in the distal stomach. The stomach appears mildly distended.  IMPRESSION: As above.   Electronically Signed   By: Inge Rise M.D.   On: 02/19/2014 00:53   Dg Abd Portable 1v  02/12/2014   CLINICAL DATA:  Orogastric tube placement  EXAM: PORTABLE ABDOMEN - 1 VIEW  COMPARISON:  10/28/2013  FINDINGS: An  orogastric tube is coiled in the stomach. No incidental acute findings, the bowel gas pattern is nonobstructive.  IMPRESSION: Orogastric tube tip in the upper stomach.   Electronically Signed   By: Monte Fantasia M.D.   On: 02/12/2014 12:52      ASSESSMENT:  1. Respiratory failure is multifactorial, with a component of acute on chronic systolic heart failure, EF 35-40%. Also major contribution from sleep apnea, upper airway disease, and possible infection. 2. No evidence of ischemic heart disease. Cath in 2012 without coronary obstruction. 3. Atrial fib with poorly controlled HR.    PLAN:  1. Dig and beta blocker for rate control. Will switch IV metoprolol to oral Toprol-XL 25 mg bid for better rate control and given LV dysfunction. Already on ACEI. Will restart warfarin per pharmacy. 2. Will switch IV Lasix to oral 40 mg daily given elevated BUN/SCr and rising CO2. 3. No need for ischemic workup.     Kate Sable, M.D., F.A.C.C.

## 2014-02-23 NOTE — Progress Notes (Addendum)
PROGRESS NOTE  William Flores NAT:[id number] DOB: 01/18/1950 DOA: 02/10/2014 PCP: Redge Gainer, MD  HPI/Recap of past 24 hours: Feeling better overall, c/o foley catheter irritation, want to eat, want to get up from bed, does reported loose stool, c diff tested negative on 2/20. Does has blood tinged sputum/blood tinged urine in foley  Assessment/Plan: Principal Problem:   Acute on chronic systolic HF (heart failure) Active Problems:   HTN (hypertension)   DM type 2, uncontrolled, with renal complications   Hyperlipidemia   Morbid obesity   Anemia   OSA on CPAP   Acute respiratory failure with hypoxia   Chronic atrial fibrillation   Right heart failure   Acute respiratory failure   Encounter for intubation   OSA (obstructive sleep apnea)   Nonischemic cardiomyopathy   Acute on chronic diastolic heart failure  Acute respiratory failure with hypoxia:  A) Acute on chronic systolic/ diastolic heart failure (KY70-62%)  B) MSSA in tracheal aspirate pna ? -2/10 Decompensated on floor with hypercarbic respiratory failure, to ICU for emergent intubation.  02/13/14 - on levophed, heparin gtt and fent gtt with versed prn 2/14 extubated,  2/16 reintubated, extubated on 2/19, stridor post extubation requiring decadron, currently hoarse, no stridor 2/21 transfer to hospitalist service on nasal cannula, continue diruesis/acei/betablocker, no lower extremity edema on 2/21, cardiology following. Repeat chest ct due to persistent infiltrate on cxr, d/c vanc, change to keflex on 2/21  Chronic atrial fibrillation/intermittent afib /rvr -on dig/toprolxl, cards following -has been on heparin drip, will transition to coumadin with lovenox bridging started from 2/21, pharmacy to manage Does has blood tinged sputum/urine/guaiac positive stool, h/h stable, will close monitor.   Renal failure on chronic kidney disease stage III -admitted with renal failure, cr normalized, -Continue ACE  inhibitor and diuretics as above    Morbid obesity/OSA on CPAP -Continue every at bedtime CPAP, noncompliant with cpap, reported cpap has been broken for a year.   DM type 2, uncontrolled, with renal complications. noninsulin dependent -a1c8.1 -oral meds on hold, on sliding scale insulin here    Anemia -does has blood tinged sputum/urine/guaiac positive stool, check tsh/iron panel/b12/folate -Recently started on iron outpatient -remote h/o colon cancer s/p resection. Lest colonoscopy 2014, outpatient GI evaluation  -h/h stable     HTN  -Currently controlled on home regimen   Hyperlipidemia -Repeat lipid panel -Not on medications prior to admission   Code Status: full  Family Communication: patient  Disposition Plan: PT/OT pending   Consultants:  Cardiology  Critical care  Procedures:  Intubationx2  L IN placement  Antibiotics:  vanc 2/19-2/20  Keflex 2/21   Objective: BP 120/79 mmHg  Pulse 106  Temp(Src) 98.2 F (36.8 C) (Axillary)  Resp 21  Ht 5' 6"  (1.676 m)  Wt 115.849 kg (255 lb 6.4 oz)  BMI 41.24 kg/m2  SpO2 96%  Intake/Output Summary (Last 24 hours) at 02/23/14 1326 Last data filed at 02/23/14 1116  Gross per 24 hour  Intake    813 ml  Output   2175 ml  Net  -1362 ml   Filed Weights   02/22/14 0453 02/22/14 1700 02/23/14 0400  Weight: 114.9 kg (253 lb 4.9 oz) 114.941 kg (253 lb 6.4 oz) 115.849 kg (255 lb 6.4 oz)    Exam: General: Morbidly obese male, in no distress Neuro: Nonfocal ,aaox3 HEENT: [place]/AT, [place] Cardiovascular: distant heart sounds, irregular rhythm, no M/R/G. Lungs: Decreased breath sounds on left, minimal wheezing,  Abdomen: Obese, soft, bs active . Mild  reducible umbilical hernia, well healed midline scar. Musculoskeletal: No acute deformities Skin: Warm,  pitting edema has resolved   Data Reviewed: Basic Metabolic Panel:  Recent Labs Lab 02/17/14 0350  02/19/14 0423 02/19/14 2130  02/20/14 1030 02/21/14 0500 02/22/14 0400 02/23/14 0423  NA 147*  < > 137 139 142 144 145 143  K 4.5  < > 5.2* 4.5 3.7 3.8 3.4* 4.1  CL 105  < > 101 104 102 103 104 103  CO2 35*  < > 32 32 34* 35* 36* 36*  GLUCOSE 225*  < > 168* 256* 161* 130* 167* 188*  BUN 41*  < > 44* 48* 47* 51* 36* 35*  CREATININE 1.18  < > 1.43* 1.24 1.28 1.20 1.07 1.04  CALCIUM 9.4  < > 9.0 8.9 9.3 9.1 9.0 9.4  MG 2.3  --  2.1  --   --   --   --   --   PHOS 5.6*  --  4.7*  --   --   --   --   --   < > = values in this interval not displayed. Liver Function Tests: No results for input(s): AST, ALT, ALKPHOS, BILITOT, PROT, ALBUMIN in the last 168 hours. No results for input(s): LIPASE, AMYLASE in the last 168 hours. No results for input(s): AMMONIA in the last 168 hours. CBC:  Recent Labs Lab 02/19/14 0423 02/20/14 0500 02/21/14 0500 02/22/14 0400 02/23/14 0423  WBC 8.7 9.1 6.7 8.3 10.1  HGB 9.8* 10.2* 10.6* 10.8* 11.9*  HCT 35.9* 35.4* 39.1 39.0 43.4  MCV 82.5 80.8 82.8 83.7 84.8  PLT 238 224 235 253 272   Cardiac Enzymes:   No results for input(s): CKTOTAL, CKMB, CKMBINDEX, TROPONINI in the last 168 hours. BNP (last 3 results)  Recent Labs  02/13/14 1112 02/14/14 0420 02/15/14 0350  BNP 79.6 45.5 75.9    ProBNP (last 3 results)  Recent Labs  08/05/13 1403 10/28/13 1746  PROBNP 376.6* 325.7*    CBG:  Recent Labs Lab 02/22/14 1943 02/22/14 2350 02/23/14 0353 02/23/14 0759 02/23/14 1148  GLUCAP 198* 130* 182* 171* 147*    Recent Results (from the past 240 hour(s))  Culture, respiratory (NON-Expectorated)     Status: None   Collection Time: 02/19/14  1:29 AM  Result Value Ref Range Status   Specimen Description ENDOTRACHEAL ASPIRATE  Final   Special Requests NONE  Final   Gram Stain   Final    NO WBC SEEN NO SQUAMOUS EPITHELIAL CELLS SEEN FEW GRAM POSITIVE COCCI IN PAIRS FEW GRAM POSITIVE RODS Performed at [facility]    Culture   Final    MODERATE  STAPHYLOCOCCUS AUREUS Note: RIFAMPIN AND GENTAMICIN SHOULD NOT BE USED AS SINGLE DRUGS FOR TREATMENT OF STAPH INFECTIONS. This organism DOES NOT demonstrate inducible Clindamycin resistance in vitro. MODERATE STREPTOCOCCUS,BETA HEMOLYTIC NOT GROUP A Performed at [facility]    Report Status 02/22/2014 FINAL  Final   Organism ID, Bacteria STAPHYLOCOCCUS AUREUS  Final      Susceptibility   Staphylococcus aureus - MIC*    CLINDAMYCIN <=0.25 SENSITIVE Sensitive     ERYTHROMYCIN >=8 RESISTANT Resistant     GENTAMICIN <=0.5 SENSITIVE Sensitive     LEVOFLOXACIN 0.25 SENSITIVE Sensitive     OXACILLIN 0.5 SENSITIVE Sensitive     PENICILLIN >=0.5 RESISTANT Resistant     RIFAMPIN <=0.5 SENSITIVE Sensitive     TRIMETH/SULFA <=10 SENSITIVE Sensitive     VANCOMYCIN <=0.5 SENSITIVE Sensitive  TETRACYCLINE <=1 SENSITIVE Sensitive     MOXIFLOXACIN <=0.25 SENSITIVE Sensitive     * MODERATE STAPHYLOCOCCUS AUREUS  Clostridium Difficile by PCR     Status: None   Collection Time: 02/22/14  4:55 AM  Result Value Ref Range Status   C difficile by pcr NEGATIVE NEGATIVE Final     Studies: No results found.  Scheduled Meds: . aspirin  81 mg Per Tube Daily  . cephALEXin  500 mg Oral Q12H  . digoxin  0.125 mg Per Tube Daily  . furosemide  40 mg Intravenous BID  . insulin aspart  0-20 Units Subcutaneous 6 times per day  . ketotifen  1 drop Both Eyes BID  . lisinopril  5 mg Oral Daily  . metoprolol  5 mg Intravenous Q4H  . pantoprazole  40 mg Oral Daily  . potassium chloride  40 mEq Oral Daily    Continuous Infusions: . heparin 2,100 Units/hr (02/23/14 0053)       Echo [place]  [facility] Pager [phone]. If 7PM-7AM, please contact night-coverage at [email or web address], password [facility] 02/23/2014, 1:26 PM  LOS: 13 days

## 2014-02-23 NOTE — Progress Notes (Signed)
Pt is tolerating nasal cannula at this time and is not requiring BiPAP, RT will continue to monitor

## 2014-02-23 NOTE — Progress Notes (Addendum)
ANTICOAGULATION CONSULT NOTE - Follow Up Consult  Pharmacy Consult for Heparin Indication: atrial fibrillation  Allergies  Allergen Reactions  . Codeine     Headache     Patient Measurements: Height: 5' 6"  (167.6 cm) Weight: 255 lb 6.4 oz (115.849 kg) IBW/kg (Calculated) : 63.8 Heparin Dosing Weight: 91 kg  Vital Signs: Temp: 98.2 F (36.8 C) (02/21 1144) Temp Source: Axillary (02/21 1144) BP: 120/79 mmHg (02/21 1144) Pulse Rate: 106 (02/21 1144)  Labs:  Recent Labs  02/21/14 0500 02/22/14 0400 02/23/14 0423  HGB 10.6* 10.8* 11.9*  HCT 39.1 39.0 43.4  PLT 235 253 272  HEPARINUNFRC 0.60 0.54 0.70  CREATININE 1.20 1.07 1.04    Estimated Creatinine Clearance: 88.1 mL/min (by C-G formula based on Cr of 1.04).   Assessment: 64 yo M admitted 02/10/2014  2/8 with dyspnea, abd distention, LE edema, 10lb wt gain: acute on chronic CHF.  Pharmacy consulted to dose heparin, 2/21 transition to lovenox and warfarin  PMH: HF, colon CA, HTN, AFib, hyperlipidemia  AC: Coumadin PTA for Afib (Coumadin 7.67m daily except 141mMon / Fri) >> IV heparin. Coumadin held again. [facility]  Infectious Disease:OSSA PNA, afebrile, WBC WNL. Very poor oral hygiene.   Vanc 2/19 >>2/21 Keflex 2/21>>  2/17 endotracheal aspirate cx - S.aureus, OSSA, consider Unasyn>Keflex PO  Cardiovascular: HTN, AFib (CHADsVASc = 3), HLD, CHF (EF 35-40%). Repeat ECHO in 6 weeks. If EF does not improve, could consider cath at that time. BP controlled, Tachy. TG 130. No plans for ischemic w/u. -- ASA81, lisinopril, Lopressor, Lasix IV / KCL, digoxin  Endocrinology: DM - CBGs<169 except one value 237 this AM.  GI / Nutr: Morbid obesity, CL diet. Miralax, po PPI  Nephro: AoCKD3 -, CrCL 85 ml/min.   Pulm: OSA - extubated 2/14, reintubated 2/16 d/t respiratory distress, Extubated 2/19.   PTA Medication Issues: allopurinol, bisoprolol, iron, sitagliptin, metformin, Coumadin  Best Practices:  heparin gtt, MC, PPI PT  Goal of Therapy:  Heparin level 0.3-0.7 units/ml Monitor platelets by anticoagulation protocol: Yes   Plan:  Continue IV heparin at 2100 units/hr Enoxaparin 115 mg SQ q12h Warfarin 10 mg today,  Daily HL, INR and CBC.  Thank you for allowing pharmacy to be a part of this patients care team.  JuRowe Robertharm.D., BCPS, AQ-Cardiology Clinical Pharmacist 02/23/2014 1:11 PM Pager: [phone]: [phone]

## 2014-02-24 LAB — BASIC METABOLIC PANEL
ANION GAP: 7 (ref 5–15)
BUN: 27 mg/dL — AB (ref 6–23)
CO2: 33 mmol/L — AB (ref 19–32)
CREATININE: 0.84 mg/dL (ref 0.50–1.35)
Calcium: 9.1 mg/dL (ref 8.4–10.5)
Chloride: 98 mmol/L (ref 96–112)
GFR calc Af Amer: >90 mL/min (ref >90)
GLUCOSE: 159 mg/dL — AB (ref 70–99)
Potassium: 3.5 mmol/L (ref 3.5–5.1)
Sodium: 138 mmol/L (ref 135–145)

## 2014-02-24 LAB — CBC
HCT: 42.4 % (ref 39.0–52.0)
HEMOGLOBIN: 11.7 g/dL — AB (ref 13.0–17.0)
MCH: 22.8 pg — AB (ref 26.0–34.0)
MCHC: 27.6 g/dL — ABNORMAL LOW (ref 30.0–36.0)
MCV: 82.7 fL (ref 78.0–100.0)
Platelets: 275 10*3/uL (ref 150–400)
RBC: 5.13 MIL/uL (ref 4.22–5.81)
RDW: 20.4 % — ABNORMAL HIGH (ref 11.5–15.5)
WBC: 10.7 10*3/uL — ABNORMAL HIGH (ref 4.0–10.5)

## 2014-02-24 LAB — GLUCOSE, CAPILLARY
GLUCOSE-CAPILLARY: 130 mg/dL — AB (ref 70–99)
GLUCOSE-CAPILLARY: 139 mg/dL — AB (ref 70–99)
GLUCOSE-CAPILLARY: 148 mg/dL — AB (ref 70–99)
GLUCOSE-CAPILLARY: 186 mg/dL — AB (ref 70–99)
Glucose-Capillary: 136 mg/dL — ABNORMAL HIGH (ref 70–99)
Glucose-Capillary: 168 mg/dL — ABNORMAL HIGH (ref 70–99)

## 2014-02-24 LAB — VITAMIN B12: VITAMIN B 12: 639 pg/mL (ref 211–911)

## 2014-02-24 LAB — IRON AND TIBC
Iron: 70 ug/dL (ref 42–165)
Saturation Ratios: 20 % (ref 20–55)
TIBC: 344 ug/dL (ref 215–435)
UIBC: 274 ug/dL (ref 125–400)

## 2014-02-24 LAB — PROTIME-INR
INR: 1.27 (ref 0.00–1.49)
Prothrombin Time: 16 seconds — ABNORMAL HIGH (ref 11.6–15.2)

## 2014-02-24 LAB — PROCALCITONIN

## 2014-02-24 LAB — MAGNESIUM: MAGNESIUM: 2.1 mg/dL (ref 1.5–2.5)

## 2014-02-24 MED ORDER — CEFUROXIME AXETIL 250 MG PO TABS
250.0000 mg | ORAL_TABLET | Freq: Two times a day (BID) | ORAL | Status: DC
  Administered 2014-02-24 – 2014-02-26 (×4): 250 mg via ORAL
  Filled 2014-02-24 (×6): qty 1

## 2014-02-24 MED ORDER — ATORVASTATIN CALCIUM 20 MG PO TABS
20.0000 mg | ORAL_TABLET | Freq: Every day | ORAL | Status: DC
  Administered 2014-02-24 – 2014-02-25 (×2): 20 mg via ORAL
  Filled 2014-02-24 (×2): qty 1

## 2014-02-24 MED ORDER — WARFARIN SODIUM 10 MG PO TABS
10.0000 mg | ORAL_TABLET | Freq: Once | ORAL | Status: AC
  Administered 2014-02-24: 10 mg via ORAL
  Filled 2014-02-24: qty 1

## 2014-02-24 MED ORDER — ENOXAPARIN SODIUM 120 MG/0.8ML ~~LOC~~ SOLN
115.0000 mg | Freq: Two times a day (BID) | SUBCUTANEOUS | Status: DC
  Administered 2014-02-24 – 2014-02-25 (×3): 115 mg via SUBCUTANEOUS
  Filled 2014-02-24 (×3): qty 0.8

## 2014-02-24 MED ORDER — FERROUS SULFATE 325 (65 FE) MG PO TABS
325.0000 mg | ORAL_TABLET | Freq: Two times a day (BID) | ORAL | Status: DC
  Administered 2014-02-24 – 2014-02-26 (×4): 325 mg via ORAL
  Filled 2014-02-24 (×4): qty 1

## 2014-02-24 MED ORDER — METOPROLOL SUCCINATE ER 50 MG PO TB24
50.0000 mg | ORAL_TABLET | Freq: Two times a day (BID) | ORAL | Status: DC
  Administered 2014-02-24 – 2014-02-26 (×5): 50 mg via ORAL
  Filled 2014-02-24 (×5): qty 1

## 2014-02-24 NOTE — Progress Notes (Signed)
SATURATION QUALIFICATIONS: (This note is used to comply with regulatory documentation for home oxygen)  Patient Saturations on Room Air at Rest = 83%  Patient Saturations on Room Air while Ambulating = Did not try given that O2 at rest on RA was 83%  Patient Saturations on 4 Liters of oxygen while Ambulating = 88-92%  Please briefly explain why patient needs home oxygen:Desats on RA without activity.  O2 on 4LO2 with activity with sats >88%.  Thanks. [place] [phone] (pager)

## 2014-02-24 NOTE — Evaluation (Signed)
Physical Therapy Evaluation Patient Details Name: William Flores MRN: [id number] DOB: 06/02/1950 Today's Date: 02/24/2014   History of Present Illness  Pt is a 64 y.o. male, known PMH diabetes, atrial fibrillation, sleep apnea on nocturnal CPAP, diastolic heart failure, likely right-sided heart failure and morbid obesity. Sent to the emergency department with shortness of breath progressive over one week. This was associated with a 10 pound weight gain and a sensation of abdominal fullness. He denies any progression in his baseline orthopnea. Evaluation in the ER revealed slight acute renal insufficiency on chronic kidney disease. 2/10 decompensated on floor with hypercarbic respiratory failure, to ICU for emergent intubation. Extubated 2/14.Reintubated 2/17 and extubated 2/19.    Clinical Impression  Pt admitted with above diagnosis. Pt currently with functional limitations due to the deficits listed below (see PT Problem List). Pt feeling better now and was able to get OOB to chair today and perform light exercise.  Hopeful that pt will be able to return to his home with [facility] therapies and states he is trying to line someone up to stay with him initially.  Will follow acutely.   Pt will benefit from skilled PT to increase their independence and safety with mobility to allow discharge to the venue listed below.      Follow Up Recommendations Home health PT;Supervision - Intermittent    Equipment Recommendations  None recommended by PT    Recommendations for Other Services       Precautions / Restrictions Precautions Precautions: None Precaution Comments: Watch O2 sats Restrictions Weight Bearing Restrictions: No      Mobility  Bed Mobility               General bed mobility comments: Pt was in chair on arrival.   Transfers Overall transfer level: Needs assistance   Transfers: Sit to/from Stand Sit to Stand: Min guard         General transfer comment: Min guard for  safety. Steadying assist required. Cues for hand placement.  Ambulation/Gait             General Gait Details: Pt just got up for first time in days.  Did some standing only for first time up.    Stairs            Wheelchair Mobility    Modified Rankin (Stroke Patients Only)       Balance           Standing balance support: No upper extremity supported;During functional activity Standing balance-Leahy Scale: Fair Standing balance comment: can stand statically up to one minute without UE support.                               Pertinent Vitals/Pain Pain Assessment: No/denies pain  O2 on 4L at rest was 95%, 86% on 4LO2 with standing at edge of chair.  BP 125/75    Home Living Family/patient expects to be discharged to:: Private residence Living Arrangements: Alone Available Help at Discharge: Family;Friend(s);Available PRN/intermittently (May be able to stay with ex-wife) Type of Home: House Home Access: Stairs to enter   Entrance Stairs-Number of Steps: 3 Home Layout: One level Home Equipment: Walker - 2 wheels;Cane - single point      Prior Function Level of Independence: Independent         Comments: Has been retired for 1 month     Hand Dominance   Dominant Hand: Right  Extremity/Trunk Assessment   Upper Extremity Assessment: Defer to OT evaluation           Lower Extremity Assessment: Generalized weakness      Cervical / Trunk Assessment: Normal  Communication   Communication: No difficulties  Cognition Arousal/Alertness: Awake/alert Behavior During Therapy: WFL for tasks assessed/performed Overall Cognitive Status: Within Functional Limits for tasks assessed                      General Comments General comments (skin integrity, edema, etc.): O2 down ot 86% on 4L with standing only.   Did not push pt to ambulate today.     Exercises General Exercises - Lower Extremity Ankle Circles/Pumps: AROM;Both;10  reps;Seated Long Arc Quad: AROM;Both;10 reps;Seated      Assessment/Plan    PT Assessment Patient needs continued PT services  PT Diagnosis Difficulty walking;Generalized weakness   PT Problem List Decreased strength;Decreased range of motion;Decreased activity tolerance;Decreased balance;Decreased mobility;Decreased knowledge of use of DME;Cardiopulmonary status limiting activity  PT Treatment Interventions DME instruction;Gait training;Stair training;Functional mobility training;Therapeutic activities;Therapeutic exercise;Neuromuscular re-education;Patient/family education;Balance training   PT Goals (Current goals can be found in the Care Plan section) Acute Rehab PT Goals Patient Stated Goal: Return home and avoid being on home O2 during the day.  PT Goal Formulation: With patient Time For Goal Achievement: 03/10/14 Potential to Achieve Goals: Good    Frequency Min 3X/week   Barriers to discharge        Co-evaluation               End of Session Equipment Utilized During Treatment: Gait belt;Oxygen Activity Tolerance: Patient tolerated treatment well Patient left: in chair;with call bell/phone within reach Nurse Communication: Mobility status         Time: 0953-1006 PT Time Calculation (min) (ACUTE ONLY): 13 min   Charges:   PT Evaluation $Initial PT Evaluation Tier I: 1 Procedure     PT G CodesDenice Paradise 2014-03-13, 2:06 PM  Lashaun Poch [facility] Acute Rehabilitation [phone] (pager)

## 2014-02-24 NOTE — Progress Notes (Signed)
Pt refuses to wear Bipap tonight. Pt encouraged to call RT if pt changes mind. Pt states he may be going to a rehab facility tomorrow. Pt encouraged to talk with MD about having a sleep study so he could have a machine for home use.

## 2014-02-24 NOTE — Progress Notes (Signed)
ANTICOAGULATION CONSULT NOTE - Follow Up Consult  Pharmacy Consult for Enoxaparin + Warfarin Indication: atrial fibrillation  Allergies  Allergen Reactions  . Codeine     Headache     Patient Measurements: Height: 5' 6"  (167.6 cm) Weight: 255 lb 8.2 oz (115.9 kg) IBW/kg (Calculated) : 63.8  Vital Signs: Temp: 98.8 F (37.1 C) (02/22 0400) Temp Source: Oral (02/22 0400) BP: 125/75 mmHg (02/22 0746) Pulse Rate: 106 (02/22 0746)  Labs:  Recent Labs  02/22/14 0400 02/23/14 0423 02/24/14 0313  HGB 10.8* 11.9* 11.7*  HCT 39.0 43.4 42.4  PLT 253 272 275  LABPROT  --   --  16.0*  INR  --   --  1.27  HEPARINUNFRC 0.54 0.70  --   CREATININE 1.07 1.04 0.84    Estimated Creatinine Clearance: 109.1 mL/min (by C-G formula based on Cr of 0.84).   Assessment: 64 YOM on warfarin PTA for hx Afib held and bridged with heparin initially this admission due to respiratory distress requiring intubation. The patient was transitioned to Lovenox on 2/21 and warfarin was resumed.  INR today remains SUBtherapeutic (INR 1.27, goal of 2-3). Renal function stable - lovenox dose remains appropriate. CBC stable - no overt s/sx of bleeding noted.   Goal of Therapy:  INR 2-3 Anti-Xa level 0.6-1 units/ml 4hrs after LMWH dose given Monitor platelets by anticoagulation protocol: Yes   Plan:  1. Continue Lovenox 115 mg SQ every 12 hours 2. Warfarin 10 mg x 1 dose at 1800 today 3. Will continue to monitor for any signs/symptoms of bleeding and will follow up with PT/INR in the a.m.   Alycia Rossetti, PharmD, BCPS Clinical Pharmacist Pager: [phone] 02/24/2014 12:39 PM

## 2014-02-24 NOTE — Clinical Social Work Placement (Addendum)
    Clinical Social Work Department CLINICAL SOCIAL WORK PLACEMENT NOTE 02/24/2014  Patient:  William Flores, William Flores  Account Number:  [id number] Admit date:  02/10/2014  Clinical Social Worker:  Berton Mount, Latanya Presser  Date/time:  02/24/2014 04:25 PM  Clinical Social Work is seeking post-discharge placement for this patient at the following level of care:   [place]   (*CSW will update this form in Epic as items are completed)   02/24/2014  Patient/family provided with [place] Department of Clinical Social Work's list of facilities offering this level of care within the geographic area requested by the patient (or if unable, by the patient's family).  02/24/2014  Patient/family informed of their freedom to choose among providers that offer the needed level of care, that participate in Medicare, Medicaid or managed care program needed by the patient, have an available bed and are willing to accept the patient.  02/24/2014  Patient/family informed of [facility]' ownership interest in [facility], as well as of the fact that they are under no obligation to receive care at this facility.  PASARR submitted to EDS on 02/24/2014 PASARR number received on 02/24/2014  FL2 transmitted to all facilities in geographic area requested by pt/family on  02/24/2014 FL2 transmitted to all facilities within larger geographic area on   Patient informed that his/her managed care company has contracts with or will negotiate with  certain facilities, including the following:     Patient/family informed of bed offers received:  02/24/2014 Patient chooses bed at [facility] Physician recommends and patient chooses bed at    Patient to be transferred to  [facility] 02/26/2014 Patient to be transferred to facility by [facility] Patient and family notified of transfer on 02/26/2014 Name of family member notified:  Pt notified and declined for CSW to call family (will notify  himself)  The following physician request were entered in Epic: Physician Request  Please sign FL2.    Additional CommentsBerton Mount, [place]

## 2014-02-24 NOTE — Progress Notes (Signed)
Bedside swallow evaluation completed.  Full report to follow.  Pt with negative CN exam although he remains hoarse s/p intubations x2.  No clinical indicators of aspiration/airway compromise with po intake observed.  Pt reports swallow to be near normal function and denies h/o dysphagia.    Recommend pt diet advance to regular/thin.   In light of recent medical events, educated pt to results/recommendations to maximize airway protection using teach back and written instructions.    Please reorder SLP if indicated.    Luanna Salk, [facility] SLP [phone]

## 2014-02-24 NOTE — Progress Notes (Signed)
     SUBJECTIVE: Feeling better. Breathing improving. No chest pain.   BP 144/71 mmHg  Pulse 108  Temp(Src) 98.8 F (37.1 C) (Oral)  Resp 25  Ht 5' 6"  (1.676 m)  Wt 255 lb 8.2 oz (115.9 kg)  BMI 41.26 kg/m2  SpO2 97%  Intake/Output Summary (Last 24 hours) at 02/24/14 8295 Last data filed at 02/23/14 1800  Gross per 24 hour  Intake  220.5 ml  Output    950 ml  Net -729.5 ml    PHYSICAL EXAM General: Well developed, well nourished, in no acute distress. Alert and oriented x 3.  Psych:  Good affect, responds appropriately Neck: No JVD. No masses noted.  Lungs: Clear bilaterally with no wheezes or rhonci noted.  Heart: irreg irreg with no murmurs noted. Abdomen: Bowel sounds are present. Soft, non-tender.  Extremities: No lower extremity edema. Chronic venous stasis changes.   LABS: Basic Metabolic Panel:  Recent Labs  02/23/14 0423 02/24/14 0313  NA 143 138  K 4.1 3.5  CL 103 98  CO2 36* 33*  GLUCOSE 188* 159*  BUN 35* 27*  CREATININE 1.04 0.84  CALCIUM 9.4 9.1  MG  --  2.1   CBC:  Recent Labs  02/23/14 0423 02/24/14 0313  WBC 10.1 10.7*  HGB 11.9* 11.7*  HCT 43.4 42.4  MCV 84.8 82.7  PLT 272 275    Current Meds: . acidophilus  1 capsule Oral Daily  . aspirin  81 mg Per Tube Daily  . cephALEXin  500 mg Oral Q12H  . digoxin  0.125 mg Per Tube Daily  . enoxaparin (LOVENOX) injection  115 mg Subcutaneous Q12H  . furosemide  40 mg Oral Daily  . insulin aspart  0-20 Units Subcutaneous 6 times per day  . ketotifen  1 drop Both Eyes BID  . lisinopril  5 mg Oral Daily  . metoprolol succinate  25 mg Oral BID  . pantoprazole  40 mg Oral Daily  . potassium chloride  40 mEq Oral Daily  . Warfarin - Pharmacist Dosing Inpatient   Does not apply q1800     ASSESSMENT AND PLAN: 64 y.o. male with a PMHx of atrial fibrillation, hyperlipidemia, diabetes mellitus, morbid obesity with obesity hypoventilation syndrome and obstructive sleep apnea,, who was admitted  to [facility] on 02/10/2014 for evaluation of worsening heart failure symptoms..   1. Atrial fibrillation: Rate is still not optimally controlled on Toprol and digoxin. Of note, he had been well controlled at home on high dose of Cardizem but this was stopped with new LV dysfunction. He is now on low dose beta blocker and digoxin. He will likely need higher doses of beta blocker to control his rate. Continue digoxin and titrate Toprol for rate control. Will increase Toprol to 50 mg po BID. Continue coumadin with heparin bridging until INR over 2.0.   2. Respiratory failure: Multifactorial, with a component of acute on chronic systolic heart failure, EF 35-40%. Also major contribution from sleep apnea, upper airway disease, and possible pneumonia.   3. Cardiomyopathy: Possibly due to tachycardia. Normal cath 2008. Plans to continue medical therapy and repeat echo in 6 weeks. No ischemic evaluation is planned.   4. Acute on chronic systolic CHF: Continue Lasix 40 mg po daily. Volume status is ok this am.    William Flores  2/22/20168:29 AM

## 2014-02-24 NOTE — Progress Notes (Signed)
PROGRESS NOTE  William Flores DVV:[id number] DOB: 07-09-1950 DOA: 02/10/2014 PCP: Redge Gainer, MD  HPI/Recap of past 24 hours: Feeling better overall, passed swallow eval, tolerating regular diet. Still hoarse. On 5liter nasal cannula, still in afib/rvr, inr not therapeutic  Less blood tinged sputum/blood tinged urine  Assessment/Plan: Principal Problem:   Acute on chronic systolic HF (heart failure) Active Problems:   HTN (hypertension)   DM type 2, uncontrolled, with renal complications   Hyperlipidemia   Morbid obesity   Anemia   OSA on CPAP   Acute respiratory failure with hypoxia   Chronic atrial fibrillation   Right heart failure   Acute respiratory failure   Encounter for intubation   OSA (obstructive sleep apnea)   Nonischemic cardiomyopathy   Acute on chronic diastolic heart failure  Acute respiratory failure with hypoxia:  A) Acute on chronic systolic/ diastolic heart failure (GY69-48%)  B) MSSA in tracheal aspirate pna ? No infiltrate on chest ct on 2/22 -2/10 Decompensated on floor with hypercarbic respiratory failure,transferred to ICU for emergent intubation.  02/13/14 - was on levophed, heparin gtt and fent gtt with versed prn while in icu 2/14 extubated,  2/16 reintubated, extubated on 2/19, stridor post extubation requiring decadron, currently hoarse, no stridor 2/21 transferred to hospitalist service on nasal cannula, continue diruesis/acei/betablocker, no lower extremity edema cardiology following.  persistent infiltrate on cxr, ct chest on 2/22, no infiltrate, does has mild pleural effusion. d/c vanc, change to oral abx on 2/21 Wean oxygen  Chronic atrial fibrillation/intermittent afib /rvr -on dig/toprolxl, cards to continue titrate meds for rate controlled. -has been on heparin drip, transitioned to coumadin with lovenox bridging started from 2/21, pharmacy to manage guaiac positive stool, Less blood tinged sputum/urine on 2/22 , h/h stable, INR  not therapeutic, will close monitor.   Renal failure on chronic kidney disease stage III -admitted with renal failure, cr normalized, -Continue ACE inhibitor and diuretics as above   iron deficiency Anemia likely from chronic blood loss -does has blood tinged sputum/urine/guaiac positive stool, iron difficiency/ tsh wnl /b12 wnl/folate pending. -Recently started on iron outpatient, continue.  -remote h/o colon cancer s/p resection. Lest colonoscopy 2014, outpatient GI evaluation  -h/h stable  DM type 2, uncontrolled, with renal complications. noninsulin dependent -a1c8.1 -oral meds on hold, on sliding scale insulin here.  -likely resume oral meds if renal function allows.  HTN  -Currently controlled on home regimen   Hyperlipidemia -fasting lipid panel/ldl 107, hdl 20 -Not on medications prior to admission, started lipitor, encourage life style modification   Morbid obesity/OSA on CPAP -Continue every at bedtime CPAP, noncompliant with cpap, reported cpap has been broken for a year. Care manager to assist.  Deconditioning: PT/wean oxygen/ discharge planning       Code Status: full  Family Communication: patient  Disposition Plan:  Likely in 1-2 days ,home with home health /home oxygen?/coumadin clinic? Vs rehab   Consultants:  Cardiology  Critical care  Procedures:  Intubationx2  L IN placement  Antibiotics:  vanc 2/19-2/20  Keflex 2/21, changed to ceftin on 2/22   Objective: BP 125/75 mmHg  Pulse 106  Temp(Src) 98.8 F (37.1 C) (Oral)  Resp 24  Ht 5' 6"  (1.676 m)  Wt 115.9 kg (255 lb 8.2 oz)  BMI 41.26 kg/m2  SpO2 99%  Intake/Output Summary (Last 24 hours) at 02/24/14 1517 Last data filed at 02/23/14 1800  Gross per 24 hour  Intake  220.5 ml  Output    150 ml  Net   70.5 ml   Filed Weights   02/22/14 1700 02/23/14 0400 02/24/14 0400  Weight: 114.941 kg (253 lb 6.4 oz) 115.849 kg (255 lb 6.4 oz) 115.9 kg (255 lb 8.2 oz)     Exam: General: Morbidly obese male, in no distress, on nasal cannula Neuro: Nonfocal ,aaox3 HEENT: [place]/AT, horseness Cardiovascular: distant heart sounds, irregular rhythm, no M/R/G. Lungs: Decreased breath sounds, less wheezing,  Abdomen: Obese, soft, bs active . Mild reducible umbilical hernia, well healed midline scar. Musculoskeletal: No acute deformities Skin: Warm,  pitting edema has resolved   Data Reviewed: Basic Metabolic Panel:  Recent Labs Lab 02/19/14 0423  02/20/14 1030 02/21/14 0500 02/22/14 0400 02/23/14 0423 02/24/14 0313  NA 137  < > 142 144 145 143 138  K 5.2*  < > 3.7 3.8 3.4* 4.1 3.5  CL 101  < > 102 103 104 103 98  CO2 32  < > 34* 35* 36* 36* 33*  GLUCOSE 168*  < > 161* 130* 167* 188* 159*  BUN 44*  < > 47* 51* 36* 35* 27*  CREATININE 1.43*  < > 1.28 1.20 1.07 1.04 0.84  CALCIUM 9.0  < > 9.3 9.1 9.0 9.4 9.1  MG 2.1  --   --   --   --   --  2.1  PHOS 4.7*  --   --   --   --   --   --   < > = values in this interval not displayed. Liver Function Tests: No results for input(s): AST, ALT, ALKPHOS, BILITOT, PROT, ALBUMIN in the last 168 hours. No results for input(s): LIPASE, AMYLASE in the last 168 hours. No results for input(s): AMMONIA in the last 168 hours. CBC:  Recent Labs Lab 02/20/14 0500 02/21/14 0500 02/22/14 0400 02/23/14 0423 02/24/14 0313  WBC 9.1 6.7 8.3 10.1 10.7*  HGB 10.2* 10.6* 10.8* 11.9* 11.7*  HCT 35.4* 39.1 39.0 43.4 42.4  MCV 80.8 82.8 83.7 84.8 82.7  PLT 224 235 253 272 275   Cardiac Enzymes:   No results for input(s): CKTOTAL, CKMB, CKMBINDEX, TROPONINI in the last 168 hours. BNP (last 3 results)  Recent Labs  02/13/14 1112 02/14/14 0420 02/15/14 0350  BNP 79.6 45.5 75.9    ProBNP (last 3 results)  Recent Labs  08/05/13 1403 10/28/13 1746  PROBNP 376.6* 325.7*    CBG:  Recent Labs Lab 02/23/14 1937 02/23/14 2344 02/24/14 0409 02/24/14 0747 02/24/14 1155  GLUCAP 204* 105* 139* 136*  148*    Recent Results (from the past 240 hour(s))  Culture, respiratory (NON-Expectorated)     Status: None   Collection Time: 02/19/14  1:29 AM  Result Value Ref Range Status   Specimen Description ENDOTRACHEAL ASPIRATE  Final   Special Requests NONE  Final   Gram Stain   Final    NO WBC SEEN NO SQUAMOUS EPITHELIAL CELLS SEEN FEW GRAM POSITIVE COCCI IN PAIRS FEW GRAM POSITIVE RODS Performed at [facility]    Culture   Final    MODERATE STAPHYLOCOCCUS AUREUS Note: RIFAMPIN AND GENTAMICIN SHOULD NOT BE USED AS SINGLE DRUGS FOR TREATMENT OF STAPH INFECTIONS. This organism DOES NOT demonstrate inducible Clindamycin resistance in vitro. MODERATE STREPTOCOCCUS,BETA HEMOLYTIC NOT GROUP A Performed at [facility]    Report Status 02/22/2014 FINAL  Final   Organism ID, Bacteria STAPHYLOCOCCUS AUREUS  Final      Susceptibility   Staphylococcus aureus - MIC*    CLINDAMYCIN <=0.25 SENSITIVE  Sensitive     ERYTHROMYCIN >=8 RESISTANT Resistant     GENTAMICIN <=0.5 SENSITIVE Sensitive     LEVOFLOXACIN 0.25 SENSITIVE Sensitive     OXACILLIN 0.5 SENSITIVE Sensitive     PENICILLIN >=0.5 RESISTANT Resistant     RIFAMPIN <=0.5 SENSITIVE Sensitive     TRIMETH/SULFA <=10 SENSITIVE Sensitive     VANCOMYCIN <=0.5 SENSITIVE Sensitive     TETRACYCLINE <=1 SENSITIVE Sensitive     MOXIFLOXACIN <=0.25 SENSITIVE Sensitive     * MODERATE STAPHYLOCOCCUS AUREUS  Clostridium Difficile by PCR     Status: None   Collection Time: 02/22/14  4:55 AM  Result Value Ref Range Status   C difficile by pcr NEGATIVE NEGATIVE Final     Studies: Ct Chest Wo Contrast  02/23/2014   CLINICAL DATA:  Pneumonia, hemoptysis, extubated in past few days, cough, history diabetes, hypertension, CHF, hyperlipidemia, atrial fibrillation, carcinoma of the colon  EXAM: CT CHEST WITHOUT CONTRAST  TECHNIQUE: Multidetector CT imaging of the chest was performed following the standard protocol without IV contrast.  Sagittal and coronal MPR images reconstructed from axial data set.  COMPARISON:  None  FINDINGS: Scattered atherosclerotic calcifications in aorta and coronary arteries.  Tip of central venous catheter in SVC proximally.  Multiple normal size mediastinal lymph nodes.  No definite thoracic adenopathy.  Pancreatic margins appear slightly indistinct, cannot exclude pancreatitis.  Remaining visualized portions of upper abdomen unremarkable.  No acute osseous findings.  Subsegmental atelectasis in both lower lobes.  No pulmonary infiltrate, pleural effusion or pneumothorax.  Scattered nonspecific subpleural fat bilaterally.  No discrete pulmonary mass or nodule.  IMPRESSION: No acute intra thoracic abnormalities.   Electronically Signed   By: Lavonia Dana M.D.   On: 02/23/2014 16:13    Scheduled Meds: . acidophilus  1 capsule Oral Daily  . aspirin  81 mg Per Tube Daily  . atorvastatin  20 mg Oral q1800  . cefUROXime  250 mg Oral BID WC  . digoxin  0.125 mg Per Tube Daily  . enoxaparin (LOVENOX) injection  115 mg Subcutaneous Q12H  . ferrous sulfate  325 mg Oral BID WC  . furosemide  40 mg Oral Daily  . insulin aspart  0-20 Units Subcutaneous 6 times per day  . ketotifen  1 drop Both Eyes BID  . lisinopril  5 mg Oral Daily  . metoprolol succinate  50 mg Oral BID  . pantoprazole  40 mg Oral Daily  . potassium chloride  40 mEq Oral Daily  . warfarin  10 mg Oral ONCE-1800  . Warfarin - Pharmacist Dosing Inpatient   Does not apply q1800    Continuous Infusions: none       William Flores  [facility] Pager [phone]. If 7PM-7AM, please contact night-coverage at [email or web address], password [facility] 02/24/2014, 3:17 PM  LOS: 14 days

## 2014-02-24 NOTE — Evaluation (Signed)
Clinical/Bedside Swallow Evaluation Patient Details  Name: William Flores MRN: [id number] Date of Birth: [birth year]  Today's Date: 02/24/2014 Time: SLP Start Time (ACUTE ONLY): 0840 SLP Stop Time (ACUTE ONLY): 0912 SLP Time Calculation (min) (ACUTE ONLY): 32 min  Past Medical History:  Past Medical History  Diagnosis Date  . Diabetes   . Colon cancer   . Hypertension   . Sleep apnea   . Atrial fibrillation   . Hyperlipidemia   . CHF (congestive heart failure)    Past Surgical History:  Past Surgical History  Procedure Laterality Date  . Colon resection    . Circumcision    . Colonoscopy N/A 09/21/2012    Procedure: COLONOSCOPY;  Surgeon: Inda Castle, MD;  Location: [facility] ENDOSCOPY;  Service: Endoscopy;  Laterality: N/A;  . US echocardiography  03/04/2010    abnormal study   HPI:  64 yo male adm to [facility] with acute on chronic heart failure - pt required intubation 2/10-2/14 then reintubated 2/16-2/19.  CT chest 2/21 negative.  Pt with h/o obesity, CPAP usage and was hoarse 2/21 per referring MD.     Assessment / Plan / Recommendation Clinical Impression  Pt with negative CN exam although he remains hoarse s/p intubations x2.  No clinical indicators of aspiration/airway compromise with po intake observed.  Pt reports swallow to be near normal function and denies h/o dysphagia.   In light of recent medical events, educated pt to results/recommendations to maximize airway protection using teach back and written instructions.  Pt assured this SLP that he would be cautious with intake.    SLP to sign off, please reorder if indicated.      Aspiration Risk  Mild (due to respiratory condition)    Diet Recommendation Regular;Thin liquid   Liquid Administration via: Cup;Straw Medication Administration: Whole meds with liquid Supervision: Patient able to self feed Compensations: Slow rate;Small sips/bites (rest if short of breath or coughing) Postural Changes and/or Swallow Maneuvers:  Seated upright 90 degrees;Upright 30-60 min after meal    Other  Recommendations   n/a  Follow Up Recommendations    n/a   Frequency and Duration   n/a     Pertinent Vitals/Pain Afebrile, decreased      Swallow Study Prior Functional Status   see [place] Date of Onset: 02/24/14 HPI: 64 yo male adm to [facility] with acute on chronic heart failure - pt required intubation 2/10-2/14 then reintubated 2/16-2/19.  CT chest 2/21 negative.  Pt with h/o obesity, CPAP usage and was hoarse 2/21 per referring MD.   Type of Study: Bedside swallow evaluation Diet Prior to this Study: Thin liquids (clears) Temperature Spikes Noted: No Respiratory Status: Nasal cannula History of Recent Intubation: Yes Length of Intubations (days): 8 days Date extubated: 02/21/14 (intubated and extubated twice) Behavior/Cognition: Alert;Cooperative;Pleasant mood Oral Cavity - Dentition: Adequate natural dentition Self-Feeding Abilities: Able to feed self Patient Positioning: Upright in bed Baseline Vocal Quality: Low vocal intensity;Hoarse Volitional Cough: Strong Volitional Swallow: Able to elicit    Oral/Motor/Sensory Function Overall Oral Motor/Sensory Function: Appears within functional limits for tasks assessed   Ice Chips Ice chips: Not tested   Thin Liquid Thin Liquid: Within functional limits Presentation: Straw;Self Fed    Nectar Thick Nectar Thick Liquid: Not tested   Honey Thick Honey Thick Liquid: Not tested   Puree Puree: Within functional limits Presentation: Self Fed;Spoon   Solid   GO    Solid: Within functional limits Presentation: Self Fed Other  Comments: pt reports peanut butter is "thick" and some stuck at room of mouth, able to clear independently *[address]       Luanna Salk, [place] [facility] SLP [phone]

## 2014-02-24 NOTE — Progress Notes (Addendum)
Clinical Social Work Department BRIEF PSYCHOSOCIAL ASSESSMENT 02/25/2014  Patient:  William Flores, William Flores     Account Number:  [id number]     [place] date:  02/10/2014  Clinical Social Worker:  Adair Laundry  Date/Time:  02/24/2014 04:23 PM  Referred by:  Physician  Date Referred:  02/24/2014 Referred for  SNF Placement   Other Referral:   Interview type:  Patient Other interview type:    PSYCHOSOCIAL DATA Living Status:  ALONE Admitted from facility:   Level of care:   Primary support name:  Marianne Sofia Primary support relationship to patient:  CHILD, ADULT Degree of support available:   Pt has limited but good support    CURRENT CONCERNS Current Concerns  Post-Acute Placement   Other Concerns:    SOCIAL WORK ASSESSMENT / PLAN CSW notified that plan was for dc home but pt has not improved and feels he cannot return home. CSW viisted pt room to discuss with pt. On CSW arrival, pt seated in chair. Pt SOB during conversation and spoke very softly. Pt expressed that he lives alone and he could find someone to stay with him but he does not believe this is the best plan for him. Pt has good insight on his condition and is ableto identify that he has deconditioned while at the hospital. Pt expressed frustration that he continuously SOB and can only move short distances. CSW explained SNF referral process to pt. Pt requested [facility] because of proximity to his home. CSW explained that pt will likely be medically stable before facility is able to get insurance authorization. CSW explained that this may limit pt options and pt may need to dc to alternate facility. Pt is agreeable to any [facility] but informed CSW he would not dc as far as [facility] where CSW informed pt there is a SNF that would be able to accept pt without insurance authorization. Pt expressed that if he cannot find a SNF option in [facility] he would like to dc home. Pt expressed that he would  either ask a friend to come stay with him or stay at that friend's house. CSW informed pt that CSW would keep pt updated as best possible. Pt thankful for information and hopeful that he will receive insurance authorization in a timely manner.   Assessment/plan status:  Psychosocial Support/Ongoing Assessment of Needs Other assessment/ plan:   Information/referral to community resources:   SNF list to be provided with bed offers    PATIENT'S/FAMILY'S RESPONSE TO PLAN OF CARE: Pt pleasant and coopeartive. Pt expressed he was tired during conversation. Pt understanding and agreeable to SNF.      [place], [place]

## 2014-02-24 NOTE — Progress Notes (Addendum)
Physical Therapy Treatment Patient Details Name: William Flores MRN: [id number] DOB: [birth year] Today's Date: 02/24/2014    History of Present Illness Pt is a 64 y.o. male, known PMH diabetes, atrial fibrillation, sleep apnea on nocturnal CPAP, diastolic heart failure, likely right-sided heart failure and morbid obesity. Sent to the emergency department with shortness of breath progressive over one week. This was associated with a 10 pound weight gain and a sensation of abdominal fullness. He denies any progression in his baseline orthopnea. Evaluation in the ER revealed slight acute renal insufficiency on chronic kidney disease. 2/10 decompensated on floor with hypercarbic respiratory failure, to ICU for emergent intubation. Extubated 2/14.Reintubated 2/17 and extubated 2/19.      PT Comments    Pt admitted with above diagnosis. Pt currently with functional limitations due to the deficits listed below (see PT Problem List). Pt having a harder time with ambulation than originally imagined fatiguing greater than anticipated.  Will need SNF at d/c.  MD and SW aware as well as CM.  Pt agreeable.   Pt will benefit from skilled PT to increase their independence and safety with mobility to allow discharge to the venue listed below.    Follow Up Recommendations  SNF;Supervision/Assistance - 24 hour     Equipment Recommendations  Other (comment) (TBA)    Recommendations for Other Services       Precautions / Restrictions Precautions Precautions: Fall Precaution Comments: Watch O2 sats Restrictions Weight Bearing Restrictions: No    Mobility  Bed Mobility               General bed mobility comments: Pt sitting on 3N1 on arrival.  NT came in to assist to clean pt as it took 2 person assist to stand from 3N1.    Transfers Overall transfer level: Needs assistance Equipment used: Rolling walker (2 wheeled) Transfers: Sit to/from Stand Sit to Stand: Mod assist;+2 physical assistance          General transfer comment: Needed mod assist for sit to stand from 3N1.  Once up stood to be cleaned with min assist for steadying.    Ambulation/Gait Ambulation/Gait assistance: Min assist;+2 safety/equipment Ambulation Distance (Feet): 25 Feet Assistive device: Rolling walker (2 wheeled) Gait Pattern/deviations: Step-through pattern;Decreased stride length;Trendelenburg Gait velocity: Decreased Gait velocity interpretation: Below normal speed for age/gender General Gait Details: Pt needed incr cues to stay close to Rw.  Pt also needed cues for step length.  Pt ambulated with flexed posture.  Pt desats as well with ambulation.  Pt taking short step length as well.  No gross LOB but poor safety and very fatigued quickly.     Stairs            Wheelchair Mobility    Modified Rankin (Stroke Patients Only)       Balance Overall balance assessment: Needs assistance;History of Falls Sitting-balance support: No upper extremity supported;Feet supported Sitting balance-Leahy Scale: Good     Standing balance support: Bilateral upper extremity supported;During functional activity Standing balance-Leahy Scale: Poor Standing balance comment: In pm, very fatigued and did need bil UE support for standing.  Unsteady overall.                      Cognition Arousal/Alertness: Awake/alert Behavior During Therapy: WFL for tasks assessed/performed Overall Cognitive Status: Within Functional Limits for tasks assessed                      Exercises General  Exercises - Lower Extremity Ankle Circles/Pumps: AROM;Both;10 reps;Seated Long Arc Quad: AROM;Both;10 reps;Seated    General Comments General comments (skin integrity, edema, etc.): O2 desat to 83% on RA.  O2 on 4LO2 with activity was 88-92%.       Pertinent Vitals/Pain Pain Assessment: No/denies pain  O2 sats on RA are 83%.  O2 sats with activity on 4LO2 are 88-92%.  HR stable.      Home Living  Family/patient expects to be discharged to:: Private residence Living Arrangements: Alone Available Help at Discharge: Family;Friend(s);Available PRN/intermittently (May be able to stay with ex-wife) Type of Home: House Home Access: Stairs to enter   Home Layout: One level Home Equipment: Environmental consultant - 2 wheels;Cane - single point      Prior Function Level of Independence: Independent      Comments: Has been retired for 1 month   PT Goals (current goals can now be found in the care plan section) Acute Rehab PT Goals Patient Stated Goal: Return home and avoid being on home O2 during the day.  PT Goal Formulation: With patient Time For Goal Achievement: 03/10/14 Potential to Achieve Goals: Good Progress towards PT goals: Progressing toward goals    Frequency  Min 3X/week    PT Plan Discharge plan needs to be updated    Co-evaluation             End of Session Equipment Utilized During Treatment: Gait belt;Oxygen Activity Tolerance: Patient limited by fatigue Patient left: in chair;with call bell/phone within reach     Time: 8979-1504 PT Time Calculation (min) (ACUTE ONLY): 34 min  Charges:  $Gait Training: 8-22 mins $Therapeutic Exercise: 8-22 mins                    G CodesIrwin Brakeman F 03/05/14, 4:43 PM [employer] Acute Rehabilitation [phone] (pager)

## 2014-02-25 LAB — CBC
HEMATOCRIT: 38.8 % — AB (ref 39.0–52.0)
Hemoglobin: 11 g/dL — ABNORMAL LOW (ref 13.0–17.0)
MCH: 23.4 pg — ABNORMAL LOW (ref 26.0–34.0)
MCHC: 28.4 g/dL — AB (ref 30.0–36.0)
MCV: 82.6 fL (ref 78.0–100.0)
PLATELETS: 237 10*3/uL (ref 150–400)
RBC: 4.7 MIL/uL (ref 4.22–5.81)
RDW: 20 % — ABNORMAL HIGH (ref 11.5–15.5)
WBC: 11.2 10*3/uL — AB (ref 4.0–10.5)

## 2014-02-25 LAB — BASIC METABOLIC PANEL
ANION GAP: 5 (ref 5–15)
BUN: 38 mg/dL — ABNORMAL HIGH (ref 6–23)
CHLORIDE: 99 mmol/L (ref 96–112)
CO2: 33 mmol/L — ABNORMAL HIGH (ref 19–32)
Calcium: 9 mg/dL (ref 8.4–10.5)
Creatinine, Ser: 1.04 mg/dL (ref 0.50–1.35)
GFR calc non Af Amer: 75 mL/min — ABNORMAL LOW (ref >90)
GFR, EST AFRICAN AMERICAN: 87 mL/min — AB (ref >90)
Glucose, Bld: 132 mg/dL — ABNORMAL HIGH (ref 70–99)
Potassium: 3.8 mmol/L (ref 3.5–5.1)
Sodium: 137 mmol/L (ref 135–145)

## 2014-02-25 LAB — GLUCOSE, CAPILLARY
GLUCOSE-CAPILLARY: 104 mg/dL — AB (ref 70–99)
GLUCOSE-CAPILLARY: 156 mg/dL — AB (ref 70–99)
GLUCOSE-CAPILLARY: 156 mg/dL — AB (ref 70–99)
GLUCOSE-CAPILLARY: 195 mg/dL — AB (ref 70–99)
Glucose-Capillary: 153 mg/dL — ABNORMAL HIGH (ref 70–99)
Glucose-Capillary: 192 mg/dL — ABNORMAL HIGH (ref 70–99)

## 2014-02-25 LAB — FOLATE RBC
Folate, Hemolysate: 574.1 ng/mL
Folate, RBC: 1380 ng/mL (ref >498)
Hematocrit: 41.6 % (ref 37.5–51.0)

## 2014-02-25 LAB — PROTIME-INR
INR: 1.63 — AB (ref 0.00–1.49)
PROTHROMBIN TIME: 19.4 s — AB (ref 11.6–15.2)

## 2014-02-25 LAB — MAGNESIUM: Magnesium: 2.1 mg/dL (ref 1.5–2.5)

## 2014-02-25 MED ORDER — ENOXAPARIN SODIUM 120 MG/0.8ML ~~LOC~~ SOLN
120.0000 mg | Freq: Two times a day (BID) | SUBCUTANEOUS | Status: DC
  Administered 2014-02-25 – 2014-02-26 (×2): 120 mg via SUBCUTANEOUS
  Filled 2014-02-25 (×2): qty 0.8

## 2014-02-25 MED ORDER — WARFARIN SODIUM 7.5 MG PO TABS
7.5000 mg | ORAL_TABLET | Freq: Once | ORAL | Status: AC
  Administered 2014-02-25: 7.5 mg via ORAL
  Filled 2014-02-25: qty 1

## 2014-02-25 MED ORDER — FUROSEMIDE 40 MG PO TABS
40.0000 mg | ORAL_TABLET | Freq: Two times a day (BID) | ORAL | Status: DC
  Administered 2014-02-25 – 2014-02-26 (×2): 40 mg via ORAL
  Filled 2014-02-25 (×2): qty 1

## 2014-02-25 NOTE — Progress Notes (Signed)
PROGRESS NOTE  William Flores OBS:[id number] DOB: [birth year] DOA: 02/10/2014 PCP: Redge Gainer, MD  HPI/Recap of past 24 hours: Feeling better overall, Still hoarse, increase ankle edema which patient attribute to sitting for long period yesterday. Less cough, breath better, oxygen tapered to 4liter, continue to refuse bipap at night. Chronic afib, rate better today, inr not therapeutic  blood tinged sputum/blood tinged urine almost resolved.  Assessment/Plan: Principal Problem:   Acute on chronic systolic HF (heart failure) Active Problems:   HTN (hypertension)   DM type 2, uncontrolled, with renal complications   Hyperlipidemia   Morbid obesity   Anemia   OSA on CPAP   Acute respiratory failure with hypoxia   Chronic atrial fibrillation   Right heart failure   Acute respiratory failure   Encounter for intubation   OSA (obstructive sleep apnea)   Nonischemic cardiomyopathy   Acute on chronic diastolic heart failure  Acute respiratory failure with hypoxia:  A) Acute on chronic systolic/ diastolic heart failure (UT65-46%)  B) MSSA in tracheal aspirate pna ? No infiltrate on chest ct on 2/22 -2/10 Decompensated on floor with hypercarbic respiratory failure,transferred to ICU for emergent intubation.  02/13/14 - was on levophed, heparin gtt and fent gtt with versed prn while in icu 2/14 extubated,  2/16 reintubated, extubated on 2/19, stridor post extubation requiring decadron, currently hoarse, no stridor 2/21 transferred to hospitalist service on nasal cannula, continue diruesis/acei/betablocker, no lower extremity edema cardiology following.  persistent infiltrate on cxr, ct chest on 2/22, no infiltrate, does has mild pleural effusion. d/c vanc, change to oral abx on 2/21 Wean oxygen, increase lasix on 2/23 due to new ankle edma.  Chronic atrial fibrillation/intermittent afib /rvr -on dig/toprolxl, cards to continue titrate meds for rate controlled. -has been on heparin  drip, transitioned to coumadin with lovenox bridging started from 2/21, pharmacy to manage guaiac positive stool, Less blood tinged sputum/urine on 2/22 , h/h stable, INR not therapeutic, will close monitor.   Renal failure on chronic kidney disease stage III -admitted with renal failure, cr normalized, -Continue ACE inhibitor and diuretics as above   iron deficiency Anemia likely from chronic blood loss -does has blood tinged sputum/urine/guaiac positive stool, iron difficiency/ tsh wnl /b12 wnl/folate pending. -Recently started on iron outpatient, continue.  -remote h/o colon cancer s/p resection. Lest colonoscopy 2014, outpatient GI evaluation  -h/h stable  DM type 2, uncontrolled, with renal complications. noninsulin dependent -a1c8.1 -oral meds on hold, on sliding scale insulin here.  -likely resume oral meds if renal function allows.  HTN  -Currently controlled on home regimen   Hyperlipidemia -fasting lipid panel/ldl 107, hdl 20 -Not on medications prior to admission, started lipitor, encourage life style modification   Morbid obesity/OSA on CPAP -Continue every at bedtime CPAP, noncompliant with cpap, reported cpap has been broken for a year. Care manager to assist outpatient sleep study, cpap fitting  Deconditioning: PT/wean oxygen/ SNF in 1/2 days       Code Status: full  Family Communication: patient  Disposition Plan: continue wean oxygen, may need home oxygen, monitor inr,  Continue adjust lasix dose and afib meds, SNF 1-2 days   Consultants:  Cardiology  Critical care  Procedures:  Intubationx2  L IN placement  Antibiotics:  vanc 2/19-2/20  Keflex 2/21, changed to ceftin on 2/22   Objective: BP 101/63 mmHg  Pulse 115  Temp(Src) 97.6 F (36.4 C) (Oral)  Resp 21  Ht 5' 6"  (1.676 m)  Wt 118.117 kg (260 lb  6.4 oz)  BMI 42.05 kg/m2  SpO2 99%  Intake/Output Summary (Last 24 hours) at 02/25/14 1304 Last data filed at 02/25/14 7322   Gross per 24 hour  Intake    240 ml  Output    100 ml  Net    140 ml   Filed Weights   02/23/14 0400 02/24/14 0400 02/25/14 0809  Weight: 115.849 kg (255 lb 6.4 oz) 115.9 kg (255 lb 8.2 oz) 118.117 kg (260 lb 6.4 oz)    Exam: General: Morbidly obese male, in no distress, on nasal cannula Neuro: Nonfocal ,aaox3 HEENT: [place]/AT, [place] Cardiovascular: distant heart sounds, irregular rhythm, no M/R/G. Lungs: Decreased breath sounds, wheezing almost resolved. Abdomen: Obese, soft, bs active . Mild reducible umbilical hernia, well healed midline scar. Musculoskeletal: No acute deformities Skin: Warm,  pitting edema returned on 2/23   Data Reviewed: Basic Metabolic Panel:  Recent Labs Lab 02/19/14 0423  02/21/14 0500 02/22/14 0400 02/23/14 0423 02/24/14 0313 02/25/14 0415  NA 137  < > 144 145 143 138 137  K 5.2*  < > 3.8 3.4* 4.1 3.5 3.8  CL 101  < > 103 104 103 98 99  CO2 32  < > 35* 36* 36* 33* 33*  GLUCOSE 168*  < > 130* 167* 188* 159* 132*  BUN 44*  < > 51* 36* 35* 27* 38*  CREATININE 1.43*  < > 1.20 1.07 1.04 0.84 1.04  CALCIUM 9.0  < > 9.1 9.0 9.4 9.1 9.0  MG 2.1  --   --   --   --  2.1 2.1  PHOS 4.7*  --   --   --   --   --   --   < > = values in this interval not displayed. Liver Function Tests: No results for input(s): AST, ALT, ALKPHOS, BILITOT, PROT, ALBUMIN in the last 168 hours. No results for input(s): LIPASE, AMYLASE in the last 168 hours. No results for input(s): AMMONIA in the last 168 hours. CBC:  Recent Labs Lab 02/21/14 0500 02/22/14 0400 02/23/14 0423 02/24/14 0313 02/25/14 0415  WBC 6.7 8.3 10.1 10.7* 11.2*  HGB 10.6* 10.8* 11.9* 11.7* 11.0*  HCT 39.1 39.0 43.4 42.4 38.8*  MCV 82.8 83.7 84.8 82.7 82.6  PLT 235 253 272 275 237   Cardiac Enzymes:   No results for input(s): CKTOTAL, CKMB, CKMBINDEX, TROPONINI in the last 168 hours. BNP (last 3 results)  Recent Labs  02/13/14 1112 02/14/14 0420 02/15/14 0350  BNP 79.6 45.5 75.9     ProBNP (last 3 results)  Recent Labs  08/05/13 1403 10/28/13 1746  PROBNP 376.6* 325.7*    CBG:  Recent Labs Lab 02/24/14 2058 02/24/14 2345 02/25/14 0425 02/25/14 0748 02/25/14 1154  GLUCAP 168* 130* 104* 153* 156*    Recent Results (from the past 240 hour(s))  Culture, respiratory (NON-Expectorated)     Status: None   Collection Time: 02/19/14  1:29 AM  Result Value Ref Range Status   Specimen Description ENDOTRACHEAL ASPIRATE  Final   Special Requests NONE  Final   Gram Stain   Final    NO WBC SEEN NO SQUAMOUS EPITHELIAL CELLS SEEN FEW GRAM POSITIVE COCCI IN PAIRS FEW GRAM POSITIVE RODS Performed at [facility]    Culture   Final    MODERATE STAPHYLOCOCCUS AUREUS Note: RIFAMPIN AND GENTAMICIN SHOULD NOT BE USED AS SINGLE DRUGS FOR TREATMENT OF STAPH INFECTIONS. This organism DOES NOT demonstrate inducible Clindamycin resistance in vitro. MODERATE STREPTOCOCCUS,BETA HEMOLYTIC  NOT GROUP A Performed at [facility]    Report Status 02/22/2014 FINAL  Final   Organism ID, Bacteria STAPHYLOCOCCUS AUREUS  Final      Susceptibility   Staphylococcus aureus - MIC*    CLINDAMYCIN <=0.25 SENSITIVE Sensitive     ERYTHROMYCIN >=8 RESISTANT Resistant     GENTAMICIN <=0.5 SENSITIVE Sensitive     LEVOFLOXACIN 0.25 SENSITIVE Sensitive     OXACILLIN 0.5 SENSITIVE Sensitive     PENICILLIN >=0.5 RESISTANT Resistant     RIFAMPIN <=0.5 SENSITIVE Sensitive     TRIMETH/SULFA <=10 SENSITIVE Sensitive     VANCOMYCIN <=0.5 SENSITIVE Sensitive     TETRACYCLINE <=1 SENSITIVE Sensitive     MOXIFLOXACIN <=0.25 SENSITIVE Sensitive     * MODERATE STAPHYLOCOCCUS AUREUS  Clostridium Difficile by PCR     Status: None   Collection Time: 02/22/14  4:55 AM  Result Value Ref Range Status   C difficile by pcr NEGATIVE NEGATIVE Final     Studies: No results found.  Scheduled Meds: . acidophilus  1 capsule Oral Daily  . aspirin  81 mg Per Tube Daily  . atorvastatin   20 mg Oral q1800  . cefUROXime  250 mg Oral BID WC  . digoxin  0.125 mg Per Tube Daily  . enoxaparin (LOVENOX) injection  120 mg Subcutaneous Q12H  . ferrous sulfate  325 mg Oral BID WC  . furosemide  40 mg Oral BID  . insulin aspart  0-20 Units Subcutaneous 6 times per day  . ketotifen  1 drop Both Eyes BID  . lisinopril  5 mg Oral Daily  . metoprolol succinate  50 mg Oral BID  . pantoprazole  40 mg Oral Daily  . potassium chloride  40 mEq Oral Daily  . warfarin  7.5 mg Oral ONCE-1800  . Warfarin - Pharmacist Dosing Inpatient   Does not apply q1800    Continuous Infusions: none       Harlan Vinal  [facility] Pager [phone]. If 7PM-7AM, please contact night-coverage at [email or web address], password [facility] 02/25/2014, 1:04 PM  LOS: 15 days

## 2014-02-25 NOTE — Progress Notes (Addendum)
CSW (Clinical Education officer, museum) spoke with [facility] admissions. They confirmed they are working on Ship broker. CSW to awaiting response.   ADDENDUM 3:10pm: CSW notified by facility that pt has insurance authorization for SNF. MD notified but informed CSW that plan will be for potential dc tomorrow.   [place], [place]

## 2014-02-25 NOTE — Progress Notes (Signed)
NUTRITION FOLLOW UP  Intervention:    Continue Heart Healthy, CHO-modified diet to provide nutrition needs.  Nutrition Dx:   Inadequate oral intake related to inability to eat as evidenced by NPO status; resolved.  No new nutrition diagnosis at this time.  New Goal:   Intake to meet >90% of estimated nutrition needs. Met.  Monitor:   PO intake, labs, weight trend.  Assessment:   Pt hx of DM, A.fib, sleep apnea, CHF, morbid obesity. Pt came to ED with SOB associated with 10 lb wt gain and abdominal fullness.   Patient was extubated on 2/19. Diet has been advanced to heart healthy CHO modified. Discussed patient with RN. Patient is tolerating diet well with good oral intake, 75-100% meal completion. Plans for d/c to SNF soon.   Height: Ht Readings from Last 1 Encounters:  02/22/14 _0  (1.676 m)    Weight Status:   Wt Readings from Last 1 Encounters:  02/25/14 260 lb 6.4 oz (118.117 kg)   02/18/14 277 lb 9 oz (125.9 kg)       02/12/14 289 lb 14.5 oz (131.5 kg)       Re-estimated needs:  Kcal: 2000-2200 Protein: 130 gm Fluid: 2-2.2 L  Skin: stage 2 pressure ulcer on sacrum  Diet Order: Diet heart healthy/carb modified   Intake/Output Summary (Last 24 hours) at 02/25/14 1156 Last data filed at 02/25/14 0811  Gross per 24 hour  Intake    240 ml  Output    100 ml  Net    140 ml    Last BM: 2/22   Labs:   Recent Labs Lab 02/19/14 0423  02/23/14 0423 02/24/14 0313 02/25/14 0415  NA 137  < > 143 138 137  K 5.2*  < > 4.1 3.5 3.8  CL 101  < > 103 98 99  CO2 32  < > 36* 33* 33*  BUN 44*  < > 35* 27* 38*  CREATININE 1.43*  < > 1.04 0.84 1.04  CALCIUM 9.0  < > 9.4 9.1 9.0  MG 2.1  --   --  2.1 2.1  PHOS 4.7*  --   --   --   --   GLUCOSE 168*  < > 188* 159* 132*  < > = values in this interval not displayed.  CBG (last 3)   Recent Labs  02/24/14 2345 02/25/14 0425 02/25/14 0748  GLUCAP 130* 104* 153*    Scheduled Meds: . acidophilus   1 capsule Oral Daily  . aspirin  81 mg Per Tube Daily  . atorvastatin  20 mg Oral q1800  . cefUROXime  250 mg Oral BID WC  . digoxin  0.125 mg Per Tube Daily  . enoxaparin (LOVENOX) injection  120 mg Subcutaneous Q12H  . ferrous sulfate  325 mg Oral BID WC  . furosemide  40 mg Oral Daily  . insulin aspart  0-20 Units Subcutaneous 6 times per day  . ketotifen  1 drop Both Eyes BID  . lisinopril  5 mg Oral Daily  . metoprolol succinate  50 mg Oral BID  . pantoprazole  40 mg Oral Daily  . potassium chloride  40 mEq Oral Daily  . warfarin  7.5 mg Oral ONCE-1800  . Warfarin - Pharmacist Dosing Inpatient   Does not apply q1800    Continuous Infusions:    Molli Barrows, RD, LDN, [place] Pager [phone] After Hours Pager [phone]

## 2014-02-25 NOTE — Progress Notes (Signed)
ANTICOAGULATION CONSULT NOTE - Follow Up Consult  Pharmacy Consult for Lovenox and Coumadin Indication: atrial fibrillation (history)  Allergies  Allergen Reactions  . Codeine     Headache     Patient Measurements: Height: 5' 6"  (167.6 cm) Weight: 260 lb 6.4 oz (118.117 kg) IBW/kg (Calculated) : 63.8  Vital Signs: Temp: 97.6 F (36.4 C) (02/23 0809) Temp Source: Oral (02/23 0809) BP: 101/63 mmHg (02/23 0809) Pulse Rate: 115 (02/23 0809)  Labs:  Recent Labs  02/23/14 0423 02/24/14 0313 02/25/14 0415  HGB 11.9* 11.7* 11.0*  HCT 43.4 42.4 38.8*  PLT 272 275 237  LABPROT  --  16.0* 19.4*  INR  --  1.27 1.63*  HEPARINUNFRC 0.70  --   --   CREATININE 1.04 0.84 1.04    Estimated Creatinine Clearance: 89.1 mL/min (by C-G formula based on Cr of 1.04).   Medications:  Scheduled:  . acidophilus  1 capsule Oral Daily  . aspirin  81 mg Per Tube Daily  . atorvastatin  20 mg Oral q1800  . cefUROXime  250 mg Oral BID WC  . digoxin  0.125 mg Per Tube Daily  . enoxaparin (LOVENOX) injection  115 mg Subcutaneous Q12H  . ferrous sulfate  325 mg Oral BID WC  . furosemide  40 mg Oral Daily  . insulin aspart  0-20 Units Subcutaneous 6 times per day  . ketotifen  1 drop Both Eyes BID  . lisinopril  5 mg Oral Daily  . metoprolol succinate  50 mg Oral BID  . pantoprazole  40 mg Oral Daily  . potassium chloride  40 mEq Oral Daily  . Warfarin - Pharmacist Dosing Inpatient   Does not apply q1800    Assessment: 64 yo M continues on Lovenox >> Coumadin for hx of afib.  Pts INR has trended up nicely after 2 x 57m doses.  Will reduce Coumadin dose this evening to home dose.  Also noted, pt weight has increased slightly.  Will increase Lovenox dosing accordingly.  CBC is stable, no bleeding noted.  Goal of Therapy:  INR 2-3 Anti-Xa level 0.6-1 units/ml 4hrs after LMWH dose given Monitor platelets by anticoagulation protocol: Yes   Plan:  Increase Lovenox dose to 120 mg SQ  Q12h Coumadin 7.567mPO x 1 tonight Continue daily INR. CBC q72 hours while on Lovenox.  [employer] IncPharm.D., BCPS Clinical Pharmacist Pager [phone]/23/2016 10:07 AM

## 2014-02-25 NOTE — Progress Notes (Addendum)
. Patient Profile 64 y.o. male with a PMHx of atrial fibrillation, hyperlipidemia, diabetes mellitus, morbid obesity with obesity hypoventilation syndrome and obstructive sleep apnea, who was admitted to [facility] on 02/10/2014 for evaluation of worsening heart failure symptoms..    Subjective: Feels ok. Throat is still sore from recent intubation. Denies palpitations. Has mild dyspnea. On [place].   Objective: Vital signs in last 24 hours: Temp:  [97.6 F (36.4 C)-98.1 F (36.7 C)] 97.6 F (36.4 C) (02/23 0809) Pulse Rate:  [65-115] 115 (02/23 0809) Resp:  [20-23] 21 (02/23 0809) BP: (101-132)/(60-64) 101/63 mmHg (02/23 0809) SpO2:  [98 %-99 %] 99 % (02/23 0809) Weight:  [260 lb 6.4 oz (118.117 kg)] 260 lb 6.4 oz (118.117 kg) (02/23 0809) Last BM Date: 02/24/14  Intake/Output from previous day: 02/22 0701 - 02/23 0700 In: -  Out: 100 [Urine:100] Intake/Output this shift: Total I/O In: 240 [P.O.:240] Out: -   Medications Current Facility-Administered Medications  Medication Dose Route Frequency Provider Last Rate Last Dose  . acetaminophen (TYLENOL) tablet 650 mg  650 mg Oral Q4H PRN Jules Husbands, MD   650 mg at 02/23/14 0046  . acidophilus (RISAQUAD) capsule 1 capsule  1 capsule Oral Daily Florencia Reasons, MD   1 capsule at 02/25/14 1028  . alum & mag hydroxide-simeth (MAALOX/MYLANTA) 200-200-20 MG/5ML suspension 15 mL  15 mL Oral Q6H PRN Kara Mead V, MD   15 mL at 02/17/14 1040  . aspirin chewable tablet 81 mg  81 mg Per Tube Daily Rahul P Desai, PA-C   81 mg at 02/25/14 1028  . atorvastatin (LIPITOR) tablet 20 mg  20 mg Oral q1800 Florencia Reasons, MD   20 mg at 02/24/14 1722  . cefUROXime (CEFTIN) tablet 250 mg  250 mg Oral BID WC Florencia Reasons, MD   250 mg at 02/25/14 0830  . digoxin (LANOXIN) tablet 0.125 mg  0.125 mg Per Tube Daily Rahul P Desai, PA-C   0.125 mg at 02/25/14 1028  . enoxaparin (LOVENOX) injection 120 mg  120 mg Subcutaneous Q12H Rocky Crafts [place], RPH      . ferrous sulfate  tablet 325 mg  325 mg Oral BID WC Florencia Reasons, MD   325 mg at 02/25/14 0830  . furosemide (LASIX) tablet 40 mg  40 mg Oral Daily Herminio Commons, MD   40 mg at 02/25/14 1028  . insulin aspart (novoLOG) injection 0-20 Units  0-20 Units Subcutaneous 6 times per day Donita Brooks, NP   4 Units at 02/25/14 0830  . ketotifen (ZADITOR) 0.025 % ophthalmic solution 1 drop  1 drop Both Eyes BID Samella Parr, NP   1 drop at 02/25/14 1031  . levalbuterol (XOPENEX) nebulizer solution 0.63 mg  0.63 mg Nebulization Q2H PRN Rahul P Desai, PA-C   0.63 mg at 02/23/14 2245  . lidocaine (XYLOCAINE) 2 % jelly 1 application  1 application Urethral O2V PRN Jules Husbands, MD   1 application at 03/50/09 0101  . lisinopril (PRINIVIL,ZESTRIL) tablet 5 mg  5 mg Oral Daily Olam Idler, MD   5 mg at 02/25/14 1028  . menthol-cetylpyridinium (CEPACOL) lozenge 3 mg  1 lozenge Oral PRN Eileen Stanford, PA-C   3 mg at 02/18/14 2221  . metoprolol succinate (TOPROL-XL) 24 hr tablet 50 mg  50 mg Oral BID Burnell Blanks, MD   50 mg at 02/25/14 1028  . pantoprazole (PROTONIX) EC tablet 40 mg  40 mg Oral Daily Rigoberto Noel, MD  40 mg at 02/25/14 1028  . potassium chloride SA (K-DUR,KLOR-CON) CR tablet 40 mEq  40 mEq Oral Daily Kara Mead V, MD   40 mEq at 02/25/14 1028  . warfarin (COUMADIN) tablet 7.5 mg  7.5 mg Oral ONCE-1800 Rocky Crafts [place], [facility]      . Warfarin - Pharmacist Dosing Inpatient   Does not apply q1800 Florencia Reasons, MD   Stopped at 02/23/14 1800    PE: General appearance: alert, cooperative, no distress and moderately obese Neck: no carotid bruit and no JVD Lungs: decrease BS at the bases Heart: irregularly irregular rhythm Extremities: no LEE Pulses: 2+ and symmetric Skin: warm and dry Neurologic: Grossly normal  Lab Results:   Recent Labs  02/23/14 0423 02/24/14 0313 02/25/14 0415  WBC 10.1 10.7* 11.2*  HGB 11.9* 11.7* 11.0*  HCT 43.4 42.4 38.8*  PLT 272 275 237   BMET  Recent  Labs  02/23/14 0423 02/24/14 0313 02/25/14 0415  NA 143 138 137  K 4.1 3.5 3.8  CL 103 98 99  CO2 36* 33* 33*  GLUCOSE 188* 159* 132*  BUN 35* 27* 38*  CREATININE 1.04 0.84 1.04  CALCIUM 9.4 9.1 9.0   PT/INR  Recent Labs  02/24/14 0313 02/25/14 0415  LABPROT 16.0* 19.4*  INR 1.27 1.63*    Assessment/Plan  Principal Problem:   Acute on chronic systolic HF (heart failure) Active Problems:   HTN (hypertension)   DM type 2, uncontrolled, with renal complications   Hyperlipidemia   Morbid obesity   Anemia   OSA on CPAP   Acute respiratory failure with hypoxia   Chronic atrial fibrillation   Right heart failure   Acute respiratory failure   Encounter for intubation   OSA (obstructive sleep apnea)   Nonischemic cardiomyopathy   Acute on chronic diastolic heart failure   1. Atrial fibrillation: Still in afib with resting rate ~110. Not a candidate for Cardizem given reduced LVF and not a candidate for amiodarone given baseline pulmonary issues. Continue Toprol XL and digoxin. Toprol just increased to 50 mg yesterday. BP is soft but stable. May need further upward titration in the next few days if BP can tolerate. Continue coumadin with heparin bridging until INR over 2.0.   2. Respiratory failure: Multifactorial, with a component of acute on chronic systolic heart failure, EF 35-40%. Also major contribution from sleep apnea, upper airway disease, and possible pneumonia.   3. Cardiomyopathy: Possibly due to tachycardia. Normal cath 2008. Plans to continue medical therapy and repeat echo in 6 weeks. No ischemic evaluation is planned.   4. Acute on chronic systolic CHF: Continue Lasix 40 mg po daily. Volume status is ok this am.    LOS: 15 days    Brittainy M. Ladoris Gene 02/25/2014 10:52 AM  I have personally seen and examined this patient with Lyda Jester, PA-C. I agree with the assessment and plan as outlined above. Persistent atrial fibrillation with rate  better controlled. Can titrate Toprol for better rate control. Continue coumadin with goal INR of 2.0. Will plan medical management of cardiomyopathy. Increased LE edema. Will increase Lasix to 40 mg po BID (home dose 80am, 40pm).  Antibiotics per primary team.   [facility] 02/25/2014 12:38 PM

## 2014-02-26 LAB — MAGNESIUM: MAGNESIUM: 2.2 mg/dL (ref 1.5–2.5)

## 2014-02-26 LAB — PROTIME-INR
INR: 1.68 — ABNORMAL HIGH (ref 0.00–1.49)
Prothrombin Time: 20 seconds — ABNORMAL HIGH (ref 11.6–15.2)

## 2014-02-26 LAB — GLUCOSE, CAPILLARY
GLUCOSE-CAPILLARY: 125 mg/dL — AB (ref 70–99)
GLUCOSE-CAPILLARY: 137 mg/dL — AB (ref 70–99)
Glucose-Capillary: 200 mg/dL — ABNORMAL HIGH (ref 70–99)

## 2014-02-26 MED ORDER — METOPROLOL SUCCINATE ER 50 MG PO TB24: 50.0000 mg | ORAL_TABLET | Freq: Two times a day (BID) | ORAL | Status: DC

## 2014-02-26 MED ORDER — ATORVASTATIN CALCIUM 20 MG PO TABS: 20.0000 mg | ORAL_TABLET | Freq: Every day | ORAL | Status: DC

## 2014-02-26 MED ORDER — FUROSEMIDE 40 MG PO TABS
40.0000 mg | ORAL_TABLET | Freq: Once | ORAL | Status: AC
  Administered 2014-02-26: 40 mg via ORAL
  Filled 2014-02-26: qty 1

## 2014-02-26 MED ORDER — LISINOPRIL 5 MG PO TABS: 5.0000 mg | ORAL_TABLET | Freq: Every day | ORAL | Status: DC

## 2014-02-26 MED ORDER — DIGOXIN 125 MCG PO TABS: 0.1250 mg | ORAL_TABLET | Freq: Every day | ORAL | Status: DC

## 2014-02-26 MED ORDER — FUROSEMIDE 80 MG PO TABS: 80.0000 mg | ORAL_TABLET | ORAL | Status: DC

## 2014-02-26 MED ORDER — ENOXAPARIN SODIUM 120 MG/0.8ML ~~LOC~~ SOLN: 120.0000 mg | Freq: Two times a day (BID) | SUBCUTANEOUS | Status: DC

## 2014-02-26 MED ORDER — POTASSIUM CHLORIDE CRYS ER 20 MEQ PO TBCR: 20.0000 meq | EXTENDED_RELEASE_TABLET | Freq: Every day | ORAL | Status: DC

## 2014-02-26 MED ORDER — FERROUS SULFATE 325 (65 FE) MG PO TABS: 325.0000 mg | ORAL_TABLET | Freq: Two times a day (BID) | ORAL | Status: DC

## 2014-02-26 MED ORDER — FUROSEMIDE 40 MG PO TABS: 40.0000 mg | ORAL_TABLET | Freq: Every day | ORAL | Status: DC

## 2014-02-26 MED ORDER — CEFUROXIME AXETIL 250 MG PO TABS: 250.0000 mg | ORAL_TABLET | Freq: Two times a day (BID) | ORAL | Status: DC

## 2014-02-26 MED ORDER — WARFARIN SODIUM 10 MG PO TABS: 10.0000 mg | ORAL_TABLET | Freq: Every day | ORAL | Status: DC

## 2014-02-26 MED ORDER — WARFARIN SODIUM 10 MG PO TABS: 10.0000 mg | ORAL_TABLET | Freq: Once | ORAL | Status: DC

## 2014-02-26 NOTE — Progress Notes (Signed)
Pt refuses to wear CPAP/Bipap tonight and has been for the past several evenings. Bipap was removed from room. Pt encouraged to call RT if pt changes mind.

## 2014-02-26 NOTE — Progress Notes (Signed)
ANTICOAGULATION CONSULT NOTE - Follow Up Consult  Pharmacy Consult for Lovenox and Coumadin Indication: atrial fibrillation (history)  Allergies  Allergen Reactions  . Codeine     Headache     Patient Measurements: Height: 5' 6"  (167.6 cm) Weight: 261 lb 14.4 oz (118.797 kg) IBW/kg (Calculated) : 63.8  Vital Signs: Temp: 97.8 F (36.6 C) (02/24 1221) Temp Source: Oral (02/24 1221) BP: 96/50 mmHg (02/24 1221) Pulse Rate: 99 (02/24 1221)  Labs:  Recent Labs  02/24/14 0313 02/25/14 0415 02/26/14 0520  HGB 11.7* 11.0*  --   HCT 42.4  41.6 38.8*  --   PLT 275 237  --   LABPROT 16.0* 19.4* 20.0*  INR 1.27 1.63* 1.68*  CREATININE 0.84 1.04  --     Estimated Creatinine Clearance: 89.4 mL/min (by C-G formula based on Cr of 1.04).   Medications:  Scheduled:  . acidophilus  1 capsule Oral Daily  . aspirin  81 mg Per Tube Daily  . atorvastatin  20 mg Oral q1800  . cefUROXime  250 mg Oral BID WC  . digoxin  0.125 mg Per Tube Daily  . enoxaparin (LOVENOX) injection  120 mg Subcutaneous Q12H  . ferrous sulfate  325 mg Oral BID WC  . furosemide  40 mg Oral Once  . furosemide  40 mg Oral q1800  . [START ON 02/27/2014] furosemide  80 mg Oral Q24H  . insulin aspart  0-20 Units Subcutaneous 6 times per day  . ketotifen  1 drop Both Eyes BID  . lisinopril  5 mg Oral Daily  . metoprolol succinate  50 mg Oral BID  . pantoprazole  40 mg Oral Daily  . potassium chloride  40 mEq Oral Daily  . Warfarin - Pharmacist Dosing Inpatient   Does not apply q1800    Assessment: 64 yo M continues on Lovenox >> Coumadin for hx of afib.  Pts INR with little change overnight.  Will increase dose tonight.  CBC is stable, no bleeding noted.  Goal of Therapy:  INR 2-3 Anti-Xa level 0.6-1 units/ml 4hrs after LMWH dose given Monitor platelets by anticoagulation protocol: Yes   Plan:  Continue Lovenox dose to 120 mg SQ Q12h Coumadin 10 mg PO x 1 tonight Continue daily INR. CBC q72 hours  while on Lovenox.  [employer], Pharm.D., BCPS Clinical Pharmacist Pager [phone] 02/26/2014 12:47 PM

## 2014-02-26 NOTE — Discharge Summary (Addendum)
Physician Discharge Summary  William Flores RWE:[id number] DOB: [birth year] DOA: 02/10/2014  PCP: William Gainer, MD  Admit date: 02/10/2014 Discharge date: 02/26/2014  Recommendations for Outpatient Follow-up:  1. Transfer to SNF for ongoing PT/OT 2. Wean oxygen as tolerated from 2L, keeping O2 sat > 88 % 3. Daily weights.  If patient gains more than 3-lbs in one day or 5-lbs in one week, please call the cardiology office 4. F/u with cardiology within 2 weeks of discharge for heart failure management 5. Gastroenterology f/u within 4 weeks for colonoscopy 6. INR and H&H on Friday, adjustment to coumadin prn >> have discharged on 44m daily since INR subtherapeutic but was previously on mixed 7.559mand 1039mhrough a/c clinic 7. Continue lovenox bridging until INR > 2  8. Needs outpatient sleep study/cpap testing 9. Continue cefuroxime through 2/26, then stop  Discharge Diagnoses:  Principal Problem:   Acute on chronic systolic HF (heart failure) Active Problems:   HTN (hypertension)   DM type 2, uncontrolled, with renal complications   Hyperlipidemia   Morbid obesity   Anemia   OSA on CPAP   Acute respiratory failure with hypoxia   Chronic atrial fibrillation   Right heart failure   Acute respiratory failure   Encounter for intubation   OSA (obstructive sleep apnea)   Nonischemic cardiomyopathy   Acute on chronic diastolic heart failure   Discharge Condition: stable, improved  Diet recommendation: low sodium, diabetic  Wt Readings from Last 3 Encounters:  02/26/14 118.797 kg (261 lb 14.4 oz)  02/10/14 133.72 kg (294 lb 12.8 oz)  12/19/13 128.822 kg (284 lb)    History of present illness:  William Flores a [age]. male, known past medical history diabetes, atrial fibrillation, sleep apnea on nocturnal CPAP,diastolic heart failure, likely right-sided heart failure and morbid obesity. Sent to the emergency department with shortness of breath progressive over one week  associated with a 10 pound weight gain and a sensation of abdominal fullness. He denied any progression in his baseline orthopnea. He reported a nonproductive cough. Denied fever or chills. In addition he stated he was started on iron recently for anemia.  Evaluation in the ER revealed slight acute renal insufficiency on chronic kidney disease, BNP 200. Chest x-ray demonstrated no acute disease. In addition to the above patient has had heme positive stools and had an outpatient appointment set up with [facility] for possible colonoscopy.  Hospital Course:   Acute respiratory failure with hypoxia due to acute on chronic systolic/ diastolic heart failure ([id number]%nd  Possible MSSA pneumonia although no infiltrate on chest ct on 2/22 -2/10 Decompensated on floor with hypercarbic respiratory failure,transferred to ICU for emergent intubation.  02/13/14 - was on levophed, heparin gtt and fent gtt with versed prn while in icu 2/14 extubated,  2/16 reintubated, extubated on 2/19, stridor post extubation requiring decadron, currently hoarse, no stridor 2/21 transferred to hospitalist service on nasal cannula, continue diruesis/acei/betablocker, no lower extremity edema cardiology following. persistent infiltrate on cxr, ct chest on 2/22, no infiltrate, does has mild pleural effusion. d/c vanc, change to oral abx on 2/21 Plan to complete a 7-day course of antibiotics for pneumonia  Lasix initially given IV, but now transitioned back to home oral dose oflasix  Chronic atrial fibrillation/intermittent afib /rvr, rate controlled -on dig/toprol xl.  He is not a candidate for CCB due to his borderline hypotension nor is he a candidate for amiodarone due to his pulmonary disease -  apprciate cardiology assistance -  Plan to bridge to therapeutic coumadin with lovenox -  No obvious GIB during hospitalization and already has follow up arranged with gastroenterology for evaluation of hemoccult positive  stools  Renal failure on chronic kidney disease stage III -admitted with renal failure, cr normalized, -Continue ACE inhibitor and diuretics as above  Iron deficiency Anemia likely from chronic blood loss -does has blood tinged sputum/urine/guaiac positive stool, iron difficiency/ tsh wnl /b12 wnl/folate pending. -Recently started on iron outpatient, continue.  -remote h/o colon cancer s/p resection. Lest colonoscopy 2014, outpatient GI evaluation  -h/h stable  DM type 2, uncontrolled, with renal complications. noninsulin dependent -a1c 8.1 -oral meds on hold, on sliding scale insulin here.  -  Resume oral medications  HTN  -Currently controlled on home regimen   Hyperlipidemia -fasting lipid panel/ldl 107, hdl 20 -Not on medications prior to admission, started lipitor, encourage life style modification   Morbid obesity/OSA on CPAP -Continue every at bedtime CPAP, noncompliant with cpap, reported cpap has been broken for a year. Care manager to assist outpatient sleep study, cpap fitting  Deconditioning: PT/OT  Consultants:  Cardiology  Critical care  Procedures:  Intubationx2  L IN placement  Antibiotics:  vanc 2/19-2/20  Keflex 2/21, changed to ceftin on 2/22  Discharge Exam: Filed Vitals:   02/26/14 1221  BP: 96/50  Pulse: 99  Temp: 97.8 F (36.6 C)  Resp:    Filed Vitals:   02/26/14 0521 02/26/14 0832 02/26/14 0833 02/26/14 1221  BP:  122/55  96/50  Pulse:  94  99  Temp: 98 F (36.7 C)  98 F (36.7 C) 97.8 F (36.6 C)  TempSrc: Oral  Oral Oral  Resp:  18    Height:      Weight: 118.797 kg (261 lb 14.4 oz)     SpO2:  96%  100%    General: Obese M, NAD HEENT:  Hoarse voice, NCAT Cardiovascular: RRR Respiratory: Diminished bilateral breath sounds, no focal rales or rhonchi ABD:  NABS, soft, ND/NT MSK:  1+ bilateral LEE pitting edema  Discharge Instructions      Discharge Instructions    (HEART FAILURE PATIENTS) Call MD:   Anytime you have any of the following symptoms: 1) 3 pound weight gain in 24 hours or 5 pounds in 1 week 2) shortness of breath, with or without a dry hacking cough 3) swelling in the hands, feet or stomach 4) if you have to sleep on extra pillows at night in order to breathe.    Complete by:  As directed      Call MD for:  difficulty breathing, headache or visual disturbances    Complete by:  As directed      Call MD for:  extreme fatigue    Complete by:  As directed      Call MD for:  hives    Complete by:  As directed      Call MD for:  persistant dizziness or light-headedness    Complete by:  As directed      Call MD for:  persistant nausea and vomiting    Complete by:  As directed      Call MD for:  severe uncontrolled pain    Complete by:  As directed      Call MD for:  temperature >100.4    Complete by:  As directed      Diet - low sodium heart healthy    Complete by:  As directed  Diet Carb Modified    Complete by:  As directed      Increase activity slowly    Complete by:  As directed             Medication List    STOP taking these medications        bisoprolol 5 MG tablet  Commonly known as:  ZEBETA     diltiazem 360 MG 24 hr capsule  Commonly known as:  CARDIZEM CD     metolazone 2.5 MG tablet  Commonly known as:  ZAROXOLYN      TAKE these medications        allopurinol 300 MG tablet  Commonly known as:  ZYLOPRIM  TAKE 1 TABLET (300 MG TOTAL) BY MOUTH DAILY. ALSO TAKE 100MG DAILY TO EQUAL 400MG TOTAL.     allopurinol 100 MG tablet  Commonly known as:  ZYLOPRIM  TAKE 1 TABLET (100 MG TOTAL) BY MOUTH DAILY.     aspirin 81 MG tablet  Take 81 mg by mouth daily.     atorvastatin 20 MG tablet  Commonly known as:  LIPITOR  Take 1 tablet (20 mg total) by mouth daily at 6 PM.     azelastine 0.05 % ophthalmic solution  Commonly known as:  OPTIVAR  Place 2 drops into both eyes 2 (two) times daily.     benzonatate 100 MG capsule  Commonly known as:   TESSALON  Take 1 capsule (100 mg total) by mouth 3 (three) times daily as needed for cough.     cefUROXime 250 MG tablet  Commonly known as:  CEFTIN  Take 1 tablet (250 mg total) by mouth 2 (two) times daily with a meal.     digoxin 0.125 MG tablet  Commonly known as:  LANOXIN  Place 1 tablet (0.125 mg total) into feeding tube daily.     enoxaparin 120 MG/0.8ML injection  Commonly known as:  LOVENOX  Inject 0.8 mLs (120 mg total) into the skin 2 (two) times daily.     ferrous sulfate 325 (65 FE) MG tablet  Take 1 tablet (325 mg total) by mouth 2 (two) times daily with a meal.     furosemide 40 MG tablet  Commonly known as:  LASIX  Take 80 mg by mouth 2 (two) times daily. Take 26m q AM, 40 mg hs     glucose blood test strip  Use to check BG daily     lisinopril 5 MG tablet  Commonly known as:  PRINIVIL,ZESTRIL  Take 1 tablet (5 mg total) by mouth daily.     metoprolol succinate 50 MG 24 hr tablet  Commonly known as:  TOPROL-XL  Take 1 tablet (50 mg total) by mouth 2 (two) times daily. Take with or immediately following a meal.     omeprazole 20 MG capsule  Commonly known as:  PRILOSEC  TAKE 1 CAPSULE (20 MG TOTAL) BY MOUTH DAILY.     ONETOUCH DELICA LANCETS 370VMisc  1 each by Does not apply route daily. Use one lancet to check BG daily     potassium chloride SA 20 MEQ tablet  Commonly known as:  K-DUR,KLOR-CON  Take 1 tablet (20 mEq total) by mouth daily.     sitaGLIPtin-metformin 50-1000 MG per tablet  Commonly known as:  JANUMET  Take 1 tablet by mouth 2 (two) times daily with a meal.     warfarin 10 MG tablet  Commonly known as:  COUMADIN  Take 1 tablet (  10 mg total) by mouth daily at 6 PM.          The results of significant diagnostics from this hospitalization (including imaging, microbiology, ancillary and laboratory) are listed below for reference.    Significant Diagnostic Studies: Dg Chest 2 View  02/10/2014   CLINICAL DATA:  Shortness of breath  and decreased oxygen saturations.  EXAM: CHEST  2 VIEW  COMPARISON:  10/28/2013 and 08/05/2013  FINDINGS: Chronic pleural thickening along both sides of the chest. Heart size is upper limits of normal but unchanged. The trachea is midline. Concern for small pleural effusions on the lateral view. Prominent lung markings suggest vascular congestion or mild edema. Trachea is midline.  IMPRESSION: Vascular congestion or mild interstitial edema.  Concern for small pleural effusions.   Electronically Signed   By: Markus Daft M.D.   On: 02/10/2014 14:16   Dg Chest 2 View  02/10/2014   CLINICAL DATA:  Cough and shortness of breath for 2 months.  EXAM: CHEST  2 VIEW  COMPARISON:  PA and lateral chest 08/05/2013, 02/20/2006 and 02/10/2014.  FINDINGS: Small chronic bilateral pleural effusions versus pleural thickening along the chest walls appear unchanged. There is cardiomegaly. Heart size is upper normal. No pneumothorax or pleural effusion.  IMPRESSION: No acute disease.   Electronically Signed   By: Inge Rise M.D.   On: 02/10/2014 13:49   Ct Chest Wo Contrast  02/23/2014   CLINICAL DATA:  Pneumonia, hemoptysis, extubated in past few days, cough, history diabetes, hypertension, CHF, hyperlipidemia, atrial fibrillation, carcinoma of the colon  EXAM: CT CHEST WITHOUT CONTRAST  TECHNIQUE: Multidetector CT imaging of the chest was performed following the standard protocol without IV contrast. Sagittal and coronal MPR images reconstructed from axial data set.  COMPARISON:  None  FINDINGS: Scattered atherosclerotic calcifications in aorta and coronary arteries.  Tip of central venous catheter in SVC proximally.  Multiple normal size mediastinal lymph nodes.  No definite thoracic adenopathy.  Pancreatic margins appear slightly indistinct, cannot exclude pancreatitis.  Remaining visualized portions of upper abdomen unremarkable.  No acute osseous findings.  Subsegmental atelectasis in both lower lobes.  No pulmonary  infiltrate, pleural effusion or pneumothorax.  Scattered nonspecific subpleural fat bilaterally.  No discrete pulmonary mass or nodule.  IMPRESSION: No acute intra thoracic abnormalities.   Electronically Signed   By: Lavonia Dana M.D.   On: 02/23/2014 16:13   Dg Chest Port 1 View  02/20/2014   CLINICAL DATA:  Endotracheal tube  EXAM: PORTABLE CHEST - 1 VIEW  COMPARISON:  02/19/2014  FINDINGS: The endotracheal tube tip is approximately 1.8 cm above the carina. Nasogastric tube extends into the stomach and off the inferior edge of the image There is a left jugular central line extending into the SVC. Airspace opacities persist bilaterally without significant interval change. There are effusions, left greater than right.  IMPRESSION: Probable effusions, and unchanged multifocal airspace opacities.   Electronically Signed   By: Andreas Newport M.D.   On: 02/20/2014 03:57   Portable Chest Xray  02/19/2014   CLINICAL DATA:  Initial in contour for intubation  EXAM: PORTABLE CHEST - 1 VIEW  COMPARISON:  12:33 a.m.  FINDINGS: Endotracheal tube again projects about 1.5 cm above the carinal. Cardiac enlargement is stable. NG tube is unchanged. Left central line is unchanged.  There is bilateral perihilar opacity, stable the mildly worse when compared the prior study. Retrocardiac area is not well evaluated but appears demonstrate increasing opacification.  IMPRESSION: Unchanged endotracheal  tube. Bilateral perihilar airspace opacity, left worse than right and worse bilaterally when compared to recent prior study. Possibilities include atelectasis and pneumonia. Pulmonary edema is a consideration but felt to be less likely.   Electronically Signed   By: Skipper Cliche M.D.   On: 02/19/2014 08:49   Portable Chest Xray  02/19/2014   CLINICAL DATA:  Shortness of breath.  Status post intubation.  EXAM: PORTABLE CHEST - 1 VIEW  COMPARISON:  Single view of the chest 02/16/2014.  FINDINGS: The patient is rotated on this  study. Endotracheal tube is in place with the tip approximately 1.5 cm above the carina. Left IJ catheter and NG tube are unchanged. Left worse than right airspace disease persists and also appears unchanged. There is likely a left pleural effusion. Pleural thickening or fluid along the right chest wall is noted.  IMPRESSION: ET tube tip projects 1.5 cm above the carina.  No change in left worse right basilar airspace disease.   Electronically Signed   By: Inge Rise M.D.   On: 02/19/2014 00:53   Dg Chest Portable 1 View  02/16/2014   CLINICAL DATA:  Respiratory failure.  EXAM: PORTABLE CHEST - 1 VIEW  COMPARISON:  02/15/2014  FINDINGS: Endotracheal tube is in place with tip 5.8 cm above carina. Nasogastric tube is in place with tip be on the film but be on the gastroesophageal junction. Left IJ central line tip overlies the superior vena cava.  The heart is enlarged. There is patchy density at both lung bases consistent with atelectasis or infiltrate. Suspect bilateral small effusions.  IMPRESSION: 1. Lines and tubes as described. 2. Persistent bibasilar atelectasis or infiltrates.   Electronically Signed   By: Nolon Nations M.D.   On: 02/16/2014 08:31   Dg Chest Port 1 View  02/15/2014   CLINICAL DATA:  Respiratory failure  EXAM: PORTABLE CHEST - 1 VIEW  COMPARISON:  Chest x-ray from 2 days prior.  FINDINGS: Endotracheal tube is in stable position, tip at the clavicular heads. Left IJ catheter, tip near the distal left brachiocephalic vein. The gastric suction tube is in good position.  Improved aeration, likely resolved pulmonary edema. There are bilateral pleural effusions, at least moderate on the right with layering. Focal right basilar opacity. No air leak.  Stable cardiomegaly and upper mediastinal widening.  IMPRESSION: 1. Resolved pulmonary edema. Right more than left layering pleural effusions persist. 2. Focal opacity at the right base which could reflect pneumonia or atelectasis. 3.  Stable positioning of tubes and central line.   Electronically Signed   By: Monte Fantasia M.D.   On: 02/15/2014 08:14   Portable Chest Xray  02/13/2014   CLINICAL DATA:  Sleep apnea.  Shortness of breath.  EXAM: PORTABLE CHEST - 1 VIEW  COMPARISON:  02/12/2014.  FINDINGS: Endotracheal tube, left IJ line, NG tube in stable position. Cardiomegaly with bilateral pulmonary alveolar infiltrates. Bilateral effusions. No pneumothorax. No acute bony abnormality.  IMPRESSION: 1. Lines and tubes in stable position. 2. Congestive heart failure with bilateral pulmonary edema increased from prior exams. Bilateral small pleural effusions.   Electronically Signed   By: Marcello Moores  Register   On: 02/13/2014 07:31   Dg Chest Port 1 View  02/12/2014   CLINICAL DATA:  64 year old male currently admitted. Evaluate central line placement.  EXAM: PORTABLE CHEST - 1 VIEW  COMPARISON:  Prior chest x-ray 02/12/2014  FINDINGS: New left IJ approach central venous catheter. Catheter tip overlies the junction of the left innominate  vein and the superior vena cava. Limited evaluation of the left hemi thorax. No pneumothorax. Bilateral pleural effusions, pulmonary edema and dense left retrocardiac opacity are all similar.  IMPRESSION: 1. The tip of the new left IJ approach central venous catheter overlies the junction of the left innominate vein and the SVC. 2. No definite pneumothorax although evaluation of the left chest is very limited given patient positioning.   Electronically Signed   By: Jacqulynn Cadet M.D.   On: 02/12/2014 10:33   Portable Chest Xray  02/12/2014   CLINICAL DATA:  Obstructive sleep apnea  EXAM: PORTABLE CHEST - 1 VIEW  COMPARISON:  02/10/2014  FINDINGS: Low volumes. Endotracheal tube in place with its tip 5.8 cm from the carina. Multi focal bilateral patchy airspace opacities. Small right pleural effusion.  IMPRESSION: Endotracheal tube 5.8 cm from the carina  CHF.   Electronically Signed   By: Marybelle Killings M.D.    On: 02/12/2014 09:31   Dg Abd Portable 1v  02/19/2014   CLINICAL DATA:  OG tube placement.  EXAM: PORTABLE ABDOMEN - 1 VIEW  COMPARISON:  None.  FINDINGS: OG tube is in place with the tip in the distal stomach. The stomach appears mildly distended.  IMPRESSION: As above.   Electronically Signed   By: Inge Rise M.D.   On: 02/19/2014 00:53   Dg Abd Portable 1v  02/12/2014   CLINICAL DATA:  Orogastric tube placement  EXAM: PORTABLE ABDOMEN - 1 VIEW  COMPARISON:  10/28/2013  FINDINGS: An orogastric tube is coiled in the stomach. No incidental acute findings, the bowel gas pattern is nonobstructive.  IMPRESSION: Orogastric tube tip in the upper stomach.   Electronically Signed   By: Monte Fantasia M.D.   On: 02/12/2014 12:52    Microbiology: Recent Results (from the past 240 hour(s))  Culture, respiratory (NON-Expectorated)     Status: None   Collection Time: 02/19/14  1:29 AM  Result Value Ref Range Status   Specimen Description ENDOTRACHEAL ASPIRATE  Final   Special Requests NONE  Final   Gram Stain   Final    NO WBC SEEN NO SQUAMOUS EPITHELIAL CELLS SEEN FEW GRAM POSITIVE COCCI IN PAIRS FEW GRAM POSITIVE RODS Performed at [facility]    Culture   Final    MODERATE STAPHYLOCOCCUS AUREUS Note: RIFAMPIN AND GENTAMICIN SHOULD NOT BE USED AS SINGLE DRUGS FOR TREATMENT OF STAPH INFECTIONS. This organism DOES NOT demonstrate inducible Clindamycin resistance in vitro. MODERATE STREPTOCOCCUS,BETA HEMOLYTIC NOT GROUP A Performed at [facility]    Report Status 02/22/2014 FINAL  Final   Organism ID, Bacteria STAPHYLOCOCCUS AUREUS  Final      Susceptibility   Staphylococcus aureus - MIC*    CLINDAMYCIN <=0.25 SENSITIVE Sensitive     ERYTHROMYCIN >=8 RESISTANT Resistant     GENTAMICIN <=0.5 SENSITIVE Sensitive     LEVOFLOXACIN 0.25 SENSITIVE Sensitive     OXACILLIN 0.5 SENSITIVE Sensitive     PENICILLIN >=0.5 RESISTANT Resistant     RIFAMPIN <=0.5 SENSITIVE  Sensitive     TRIMETH/SULFA <=10 SENSITIVE Sensitive     VANCOMYCIN <=0.5 SENSITIVE Sensitive     TETRACYCLINE <=1 SENSITIVE Sensitive     MOXIFLOXACIN <=0.25 SENSITIVE Sensitive     * MODERATE STAPHYLOCOCCUS AUREUS  Clostridium Difficile by PCR     Status: None   Collection Time: 02/22/14  4:55 AM  Result Value Ref Range Status   C difficile by pcr NEGATIVE NEGATIVE Final  Labs: Basic Metabolic Panel:  Recent Labs Lab 02/21/14 0500 02/22/14 0400 02/23/14 0423 02/24/14 0313 02/25/14 0415 02/26/14 0520  NA 144 145 143 138 137  --   K 3.8 3.4* 4.1 3.5 3.8  --   CL 103 104 103 98 99  --   CO2 35* 36* 36* 33* 33*  --   GLUCOSE 130* 167* 188* 159* 132*  --   BUN 51* 36* 35* 27* 38*  --   CREATININE 1.20 1.07 1.04 0.84 1.04  --   CALCIUM 9.1 9.0 9.4 9.1 9.0  --   MG  --   --   --  2.1 2.1 2.2   Liver Function Tests: No results for input(s): AST, ALT, ALKPHOS, BILITOT, PROT, ALBUMIN in the last 168 hours. No results for input(s): LIPASE, AMYLASE in the last 168 hours. No results for input(s): AMMONIA in the last 168 hours. CBC:  Recent Labs Lab 02/21/14 0500 02/22/14 0400 02/23/14 0423 02/24/14 0313 02/25/14 0415  WBC 6.7 8.3 10.1 10.7* 11.2*  HGB 10.6* 10.8* 11.9* 11.7* 11.0*  HCT 39.1 39.0 43.4 42.4  41.6 38.8*  MCV 82.8 83.7 84.8 82.7 82.6  PLT 235 253 272 275 237   Cardiac Enzymes: No results for input(s): CKTOTAL, CKMB, CKMBINDEX, TROPONINI in the last 168 hours. BNP: BNP (last 3 results)  Recent Labs  02/13/14 1112 02/14/14 0420 02/15/14 0350  BNP 79.6 45.5 75.9    ProBNP (last 3 results)  Recent Labs  08/05/13 1403 10/28/13 1746  PROBNP 376.6* 325.7*    CBG:  Recent Labs Lab 02/25/14 2007 02/25/14 2328 02/26/14 0515 02/26/14 0752 02/26/14 1133  GLUCAP 192* 195* 200* 125* 137*    Time coordinating discharge: 45 minutes  Signed:  Maude Hettich  [facility] 02/26/2014, 1:30 PM

## 2014-02-26 NOTE — Progress Notes (Signed)
Physical Therapy Treatment Patient Details Name: William Flores MRN: [id number] DOB: [birth year] Today's Date: 02/26/2014    History of Present Illness Pt is a 64 y.o. male, known PMH diabetes, atrial fibrillation, sleep apnea on nocturnal CPAP, diastolic heart failure, likely right-sided heart failure and morbid obesity. Sent to the emergency department with shortness of breath progressive over one week. This was associated with a 10 pound weight gain and a sensation of abdominal fullness. He denies any progression in his baseline orthopnea. Evaluation in the ER revealed slight acute renal insufficiency on chronic kidney disease. 2/10 decompensated on floor with hypercarbic respiratory failure, to ICU for emergent intubation. Extubated 2/14.Reintubated 2/17 and extubated 2/19.      PT Comments    Pt admitted with above diagnosis. Pt currently with functional limitations due to balance and endurance deficits.  Pt progressing and will need continued therapy at SNF.  Plan is to go to SNF today.  Pt will benefit from skilled PT to increase their independence and safety with mobility to allow discharge to the venue listed below.    Follow Up Recommendations  SNF;Supervision/Assistance - 24 hour     Equipment Recommendations  Other (comment) (TBA)    Recommendations for Other Services       Precautions / Restrictions Precautions Precautions: Fall Precaution Comments: Watch O2 sats Restrictions Weight Bearing Restrictions: No    Mobility  Bed Mobility               General bed mobility comments: Sitting in recliner on arrival.    Transfers Overall transfer level: Needs assistance Equipment used: Rolling walker (2 wheeled) Transfers: Sit to/from Stand Sit to Stand: Min assist         General transfer comment: Needed min assist for sit to stand today from recliner.   Cues for hand placement.    Ambulation/Gait Ambulation/Gait assistance: Min assist Ambulation Distance  (Feet): 70 Feet Assistive device: Rolling walker (2 wheeled) Gait Pattern/deviations: Step-through pattern;Decreased stride length;Trendelenburg Gait velocity: Decreased Gait velocity interpretation: Below normal speed for age/gender General Gait Details: Pt needed incr cues to stay close to Rw.  Pt also needed cues for step length.  Pt ambulated with flexed posture.  Pt desats as well with ambulation.  Pt taking short step length as well.  No gross LOB but poor safety and very fatigued quickly.  Had one standing rest break in hallway against wall.     Stairs            Wheelchair Mobility    Modified Rankin (Stroke Patients Only)       Balance Overall balance assessment: Needs assistance Sitting-balance support: No upper extremity supported;Feet supported Sitting balance-Leahy Scale: Good     Standing balance support: Bilateral upper extremity supported;During functional activity Standing balance-Leahy Scale: Poor Standing balance comment: Requires UE support for stability.                     Cognition Arousal/Alertness: Awake/alert Behavior During Therapy: WFL for tasks assessed/performed Overall Cognitive Status: Within Functional Limits for tasks assessed                      Exercises General Exercises - Lower Extremity Ankle Circles/Pumps: AROM;Both;10 reps;Seated Long Arc Quad: AROM;Both;10 reps;Seated Hip Flexion/Marching: AROM;Both;10 reps;Seated    General Comments General comments (skin integrity, edema, etc.): O2 reading varying on 4LO2 from 78-94% with 4LO2 in place.        Pertinent Vitals/Pain Pain Assessment:  No/denies pain  Sats on 4LO2 at rest 94%.  O2 on 4LO2 with ambulation 78-94%.  BP 96/50 pre ambulation, 88/73 after ambulation asymptomatic.  92/54 3 min after return from ambulation on departure from room.  HR 93-109 bpm.      Home Living                      Prior Function            PT Goals (current goals  can now be found in the care plan section) Progress towards PT goals: Progressing toward goals    Frequency  Min 3X/week    PT Plan Current plan remains appropriate    Co-evaluation             End of Session Equipment Utilized During Treatment: Gait belt;Oxygen Activity Tolerance: Patient limited by fatigue Patient left: in chair;with call bell/phone within reach     Time: 1231-1255 PT Time Calculation (min) (ACUTE ONLY): 24 min  Charges:  $Gait Training: 8-22 mins $Therapeutic Exercise: 8-22 mins                    G Codes:      Venna Berberich, Arrie Aran F 03/07/14, 2:14 PM Jaymie Misch,PT Acute Rehabilitation [phone] (pager)

## 2014-02-26 NOTE — Progress Notes (Signed)
CSW (Clinical Education officer, museum) prepared pt dc packet and placed with shadow chart. CSW arranged non-emergent ambulance transport. Pt, pt nurse, and facility informed. Pt to call his family. CSW signing off.  [place], [place]

## 2014-02-26 NOTE — Progress Notes (Signed)
Patient Name: William Flores Date of Encounter: 02/26/2014  Principal Problem:   Acute on chronic systolic HF (heart failure) Active Problems:   HTN (hypertension)   DM type 2, uncontrolled, with renal complications   Hyperlipidemia   Morbid obesity   Anemia   OSA on CPAP   Acute respiratory failure with hypoxia   Chronic atrial fibrillation   Right heart failure   Acute respiratory failure   Encounter for intubation   OSA (obstructive sleep apnea)   Nonischemic cardiomyopathy   Acute on chronic diastolic heart failure   Primary Cardiologist: Dr. [name]  Patient Profile: 64 y.o. male with a PMHx of atrial fibrillation, hyperlipidemia, diabetes mellitus, morbid obesity with obesity hypoventilation syndrome and obstructive sleep apnea, who was admitted to [facility] on 02/10/2014 for evaluation of worsening heart failure symptoms.   SUBJECTIVE: Thinks he is going to SNF in [place] soon. Breathing OK, throat still sore, no palpitations or chest pain.  OBJECTIVE Filed Vitals:   02/25/14 2330 02/26/14 0521 02/26/14 0832 02/26/14 0833  BP:   122/55   Pulse:   94   Temp: 98.2 F (36.8 C) 98 F (36.7 C)  98 F (36.7 C)  TempSrc: Oral Oral  Oral  Resp:   18   Height:      Weight:  261 lb 14.4 oz (118.797 kg)    SpO2:   96%     Intake/Output Summary (Last 24 hours) at 02/26/14 1054 Last data filed at 02/25/14 2100  Gross per 24 hour  Intake    480 ml  Output    200 ml  Net    280 ml   Filed Weights   02/24/14 0400 02/25/14 0809 02/26/14 0521  Weight: 255 lb 8.2 oz (115.9 kg) 260 lb 6.4 oz (118.117 kg) 261 lb 14.4 oz (118.797 kg)    PHYSICAL EXAM General: Well developed, well nourished, male in no acute distress. Head: Normocephalic, atraumatic.  Neck: Supple without bruits, JVD not elevated. Lungs:  Resp regular and unlabored, decreased BS bases. Heart: Irreg irreg, S1, S2, no S3, S4, soft murmur; no rub. Abdomen: Soft, non-tender, non-distended, BS + x 4.    Extremities: No clubbing, cyanosis, 1-2+ LE edema.  Neuro: Alert and oriented X 3. Moves all extremities spontaneously. Psych: Normal affect.  LABS: CBC: Recent Labs  02/24/14 0313 02/25/14 0415  WBC 10.7* 11.2*  HGB 11.7* 11.0*  HCT 42.4  41.6 38.8*  MCV 82.7 82.6  PLT 275 237   INR: Recent Labs  02/26/14 0520  INR 6.44*   Basic Metabolic Panel: Recent Labs  02/24/14 0313 02/25/14 0415 02/26/14 0520  NA 138 137  --   K 3.5 3.8  --   CL 98 99  --   CO2 33* 33*  --   GLUCOSE 159* 132*  --   BUN 27* 38*  --   CREATININE 0.84 1.04  --   CALCIUM 9.1 9.0  --   MG 2.1 2.1 2.2   BNP:  B NATRIURETIC PEPTIDE  Date/Time Value Ref Range Status  02/15/2014 03:50 AM 75.9 0.0 - 100.0 pg/mL Final  02/14/2014 04:20 AM 45.5 0.0 - 100.0 pg/mL Final   BNP  Date/Time Value Ref Range Status  11/13/2013 11:21 AM 105.5* 0.0 - 100.0 pg/mL Final   PRO B NATRIURETIC PEPTIDE (BNP)  Date/Time Value Ref Range Status  10/28/2013 05:46 PM 325.7* 0 - 125 pg/mL Final  08/05/2013 02:03 PM 376.6* 0 - 125 pg/mL Final  Thyroid Function Tests: Recent Labs  02/23/14 1528  TSH 0.743   Anemia Panel: Recent Labs  02/24/14 0313  VITAMINB12 639  TIBC 344  IRON 70   TELE:  Atrial fib, rate generally < 100      Current Medications:  . acidophilus  1 capsule Oral Daily  . aspirin  81 mg Per Tube Daily  . atorvastatin  20 mg Oral q1800  . cefUROXime  250 mg Oral BID WC  . digoxin  0.125 mg Per Tube Daily  . enoxaparin (LOVENOX) injection  120 mg Subcutaneous Q12H  . ferrous sulfate  325 mg Oral BID WC  . furosemide  40 mg Oral BID  . insulin aspart  0-20 Units Subcutaneous 6 times per day  . ketotifen  1 drop Both Eyes BID  . lisinopril  5 mg Oral Daily  . metoprolol succinate  50 mg Oral BID  . pantoprazole  40 mg Oral Daily  . potassium chloride  40 mEq Oral Daily  . Warfarin - Pharmacist Dosing Inpatient   Does not apply q1800      ASSESSMENT AND PLAN: 1. Atrial  fibrillation: Still in afib with resting rate ~100. Not a candidate for Cardizem given reduced LVF and not a candidate for amiodarone given baseline pulmonary issues. Continue Toprol XL and digoxin. Toprol now 50 mg bid, no doses held. BP is soft but stable. Not sure BP can tolerate further increases. Continue coumadin with heparin bridging until INR over 2.0.   2. Respiratory failure: Per IM. Multifactorial, with a component of acute on chronic systolic heart failure, EF 35-40%. Also major contribution from sleep apnea, upper airway disease, and possible pneumonia. Continue to track daily weights as an outpatient. Weight is trending up, may need to increase PO Lasix.  3. Cardiomyopathy: Possibly due to tachycardia. Normal cath 2008. EF is now 35-40 percent. Plans to continue medical therapy and repeat echo in 6 weeks. No ischemic evaluation is planned. On ASA, beta blocker, Ace.  4. Acute on chronic systolic CHF:  Weight is trending up, may need to increase PO Lasix or give a dose IV. Continue to follow daily weights  Otherwise, per IM. Placement likely in 1-2 days  Principal Problem:   Acute on chronic systolic HF (heart failure) Active Problems:   HTN (hypertension)   DM type 2, uncontrolled, with renal complications   Hyperlipidemia   Morbid obesity   Anemia   OSA on CPAP   Acute respiratory failure with hypoxia   Chronic atrial fibrillation   Right heart failure   Acute respiratory failure   Encounter for intubation   OSA (obstructive sleep apnea)   Nonischemic cardiomyopathy   Acute on chronic diastolic heart failure   Signed, Rosaria Ferries , PA-C 10:54 AM 02/26/2014  I have personally seen and examined this patient Rosaria Ferries, PA-C. I agree with the assessment and plan as outlined above. Continue current meds for rate control. Would increase Lasix back to home dose (80 mg am, 40 mg pm).   Katheren Jimmerson 02/26/2014 12:01 PM

## 2014-02-27 ENCOUNTER — Encounter: Payer: Self-pay | Admitting: Gastroenterology

## 2014-03-02 ENCOUNTER — Other Ambulatory Visit: Payer: Self-pay | Admitting: Family Medicine

## 2014-03-02 ENCOUNTER — Other Ambulatory Visit: Payer: Self-pay | Admitting: Nurse Practitioner

## 2014-03-03 ENCOUNTER — Other Ambulatory Visit: Payer: Self-pay | Admitting: Nurse Practitioner

## 2014-03-06 ENCOUNTER — Ambulatory Visit: Payer: Self-pay

## 2014-03-19 ENCOUNTER — Encounter: Payer: Self-pay | Admitting: Physician Assistant

## 2014-03-19 ENCOUNTER — Ambulatory Visit (INDEPENDENT_AMBULATORY_CARE_PROVIDER_SITE_OTHER): Payer: BLUE CROSS/BLUE SHIELD | Admitting: Physician Assistant

## 2014-03-19 VITALS — BP 126/54 | HR 53 | Ht 64.0 in | Wt 266.0 lb

## 2014-03-19 DIAGNOSIS — I5042 Chronic combined systolic (congestive) and diastolic (congestive) heart failure: Secondary | ICD-10-CM

## 2014-03-19 DIAGNOSIS — Z85038 Personal history of other malignant neoplasm of large intestine: Secondary | ICD-10-CM

## 2014-03-19 DIAGNOSIS — G4733 Obstructive sleep apnea (adult) (pediatric): Secondary | ICD-10-CM

## 2014-03-19 DIAGNOSIS — R58 Hemorrhage, not elsewhere classified: Secondary | ICD-10-CM

## 2014-03-19 DIAGNOSIS — I482 Chronic atrial fibrillation, unspecified: Secondary | ICD-10-CM

## 2014-03-19 DIAGNOSIS — I429 Cardiomyopathy, unspecified: Secondary | ICD-10-CM

## 2014-03-19 LAB — BASIC METABOLIC PANEL
BUN: 35 mg/dL — ABNORMAL HIGH (ref 6–23)
CHLORIDE: 101 meq/L (ref 96–112)
CO2: 30 meq/L (ref 19–32)
Calcium: 9.8 mg/dL (ref 8.4–10.5)
Creatinine, Ser: 1.65 mg/dL — ABNORMAL HIGH (ref 0.40–1.50)
GFR: 45.13 mL/min — ABNORMAL LOW (ref >60.00)
GLUCOSE: 147 mg/dL — AB (ref 70–99)
Potassium: 4.8 mEq/L (ref 3.5–5.1)
SODIUM: 137 meq/L (ref 135–145)

## 2014-03-19 LAB — CBC
HCT: 29.4 % — ABNORMAL LOW (ref 39.0–52.0)
Hemoglobin: 9.1 g/dL — ABNORMAL LOW (ref 13.0–17.0)
MCHC: 31 g/dL (ref 30.0–36.0)
MCV: 83 fl (ref 78.0–100.0)
Platelets: 254 10*3/uL (ref 150.0–400.0)
RBC: 3.54 Mil/uL — ABNORMAL LOW (ref 4.22–5.81)
RDW: 27.5 % — ABNORMAL HIGH (ref 11.5–15.5)
WBC: 6.3 10*3/uL (ref 4.0–10.5)

## 2014-03-19 MED ORDER — DIGOXIN 125 MCG PO TABS: 0.0625 mg | ORAL_TABLET | Freq: Every day | ORAL | Status: DC

## 2014-03-19 NOTE — Progress Notes (Signed)
Cardiology Office Note   Date:  03/19/2014   ID:  MORDCHE HEDGLIN, DOB Nov 09, 1950, MRN [id number]  PCP:  Redge Gainer, MD  Cardiologist:  Dr. Quay Burow     Chief Complaint  Patient presents with  . Congestive Heart Failure  . Atrial Fibrillation  . Hospitalization Follow-up     History of Present Illness: William Flores is a 64 y.o. male with a hx of chronic atrial fibrillation, diastolic HF, diabetes, sleep apnea, obesity, colon CA status post resection.  Patient was recently noted to have heme positive stools and outpatient GI workup was pending.    Admitted 2/8-2/24 hypoxic respiratory failure in the setting of acute on chronic combined systolic and diastolic CHF and possible pneumonia complicated by acute renal failure. He had to be intubated. He was treated with IV diuresis and antibiotics.  His course was complicated by atrial fibrillation with RVR. Echocardiogram demonstrated worsened LV function with an EF of 35-40%. Patient has a history of reduced EF at the time of cardiac catheterization 2008. Coronary arteries were normal at that time. It was suspected that his cardiomyopathy was tachycardia mediated. Recommendation was to repeat echocardiogram in 6 weeks. Ischemic evaluation was not felt to be necessary.  Calcium channel blocker was stopped and beta blocker was used for rate control.  Digoxin was also added. He had difficulty with hypotension limiting titration of his medications. He was not felt to be a good candidate for amiodarone given baseline pulmonary issues. Coumadin anticoagulation was continued.   He returns for FU.  He was discharged from the [facility] on 3/14. He is feeling much better. His breathing is improved. He is still using oxygen with ambulation.  He is going to start outpatient physical therapy. He denies chest pain or syncope. He denies dizziness or near syncope. He sleeps on 2 pillows. He denies PND. He denies significant  edema.   Studies/Reports Reviewed Today:  Echo 02/12/14 - Mod concentric hypertrophy. EF 35-40%.  Diffuse hypokinesis. - Left atrium: The atrium was mildly dilated. - Right ventricle: Systolic function was mildly reduced. - Right atrium: The atrium was normal in size. - Tricuspid valve: Structurally normal valve. There was mild regurgitation. - Pulmonary arteries: Systolic pressure was within the normal range. - Inferior vena cava: The vessel was dilated. The respirophasic diameter changes were blunted (< 50%), consistent with elevated central venous pressure. - Pericardium, extracardiac: There was no pericardial effusion.  Impressions:  Compared to the prior study from 08/07/2013 the left ventricular systolic function is moderately decreased with diffuse hypokinesis. Right ventricular systolic function is mildly impaired.  Cardiac cath 02/2006 SELECTIVE CORONARY ANGIOGRAPHY: 1. Left main normal. 2. LAD normal. 3. Left circumflex normal. 4. Ramus intermedius branch normal. 5. Right coronary artery was dominant with a 50% fairly focal mid PDA lesion, otherwise was normal. LEFT VENTRICULOGRAPHY:  LVEF 25% with moderate to severe global hypokinesia.   Past Medical History  Diagnosis Date  . Diabetes   . Colon cancer   . Hypertension   . Sleep apnea   . Atrial fibrillation   . Hyperlipidemia   . CHF (congestive heart failure)     Past Surgical History  Procedure Laterality Date  . Colon resection    . Circumcision    . Colonoscopy N/A 09/21/2012    Procedure: COLONOSCOPY;  Surgeon: Inda Castle, MD;  Location: [facility] ENDOSCOPY;  Service: Endoscopy;  Laterality: N/A;  . US echocardiography  03/04/2010    abnormal study  Current Outpatient Prescriptions  Medication Sig Dispense Refill  . allopurinol (ZYLOPRIM) 100 MG tablet TAKE 1 TABLET (100 MG TOTAL) BY MOUTH DAILY. 30 tablet 3  . allopurinol (ZYLOPRIM) 300 MG tablet TAKE 1 TABLET (300 MG TOTAL) BY MOUTH DAILY.  ALSO TAKE 100MG DAILY TO EQUAL 400MG TOTAL. 30 tablet 0  . aspirin 81 MG tablet Take 81 mg by mouth daily.    Marland Kitchen atorvastatin (LIPITOR) 20 MG tablet Take 1 tablet (20 mg total) by mouth daily at 6 PM. 30 tablet 0  . azelastine (OPTIVAR) 0.05 % ophthalmic solution Place 2 drops into both eyes 2 (two) times daily. 6 mL 12  . benzonatate (TESSALON) 100 MG capsule Take 1 capsule (100 mg total) by mouth 3 (three) times daily as needed for cough. 20 capsule 0  . cefUROXime (CEFTIN) 250 MG tablet Take 1 tablet (250 mg total) by mouth 2 (two) times daily with a meal. 5 tablet 0  . digoxin (LANOXIN) 0.125 MG tablet Place 1 tablet (0.125 mg total) into feeding tube daily. 30 tablet 0  . enoxaparin (LOVENOX) 120 MG/0.8ML injection Inject 0.8 mLs (120 mg total) into the skin 2 (two) times daily. 6 Syringe 0  . ferrous sulfate 325 (65 FE) MG tablet Take 1 tablet (325 mg total) by mouth 2 (two) times daily with a meal.  3  . furosemide (LASIX) 40 MG tablet Take 80 mg by mouth 2 (two) times daily. Take 36m q AM, 40 mg hs  3  . glucose blood test strip Use to check BG daily 100 each 2  . lisinopril (PRINIVIL,ZESTRIL) 5 MG tablet Take 1 tablet (5 mg total) by mouth daily. 30 tablet 0  . metoprolol succinate (TOPROL-XL) 50 MG 24 hr tablet Take 1 tablet (50 mg total) by mouth 2 (two) times daily. Take with or immediately following a meal. 60 tablet 0  . omeprazole (PRILOSEC) 20 MG capsule TAKE 1 CAPSULE (20 MG TOTAL) BY MOUTH DAILY. 30 capsule 4  . ONETOUCH DELICA LANCETS 384TMISC 1 each by Does not apply route daily. Use one lancet to check BG daily 100 each 2  . potassium chloride SA (K-DUR,KLOR-CON) 20 MEQ tablet Take 1 tablet (20 mEq total) by mouth daily. 30 tablet 0  . sitaGLIPtin-metformin (JANUMET) 50-1000 MG per tablet Take 1 tablet by mouth 2 (two) times daily with a meal. 60 tablet 3  . warfarin (COUMADIN) 10 MG tablet Take 1 tablet (10 mg total) by mouth daily at 6 PM. 30 tablet 0  . [DISCONTINUED]  diltiazem (TIAZAC) 240 MG 24 hr capsule TAKE 1 CAPSULE DAILY 30 capsule 3   No current facility-administered medications for this visit.    Allergies:   Codeine    Social History:  The patient  reports that he quit smoking about 30 years ago. He has never used smokeless tobacco. He reports that he does not drink alcohol or use illicit drugs.   Family History:  The patient's family history includes Breast cancer in his mother; Diabetes in his mother.    ROS:   Please see the history of present illness.   Review of Systems  Constitution: Negative for fever.  Respiratory: Negative for cough.   Hematologic/Lymphatic: Bruises/bleeds easily.       He notes a large amount of bruising on his lower abdomen from Lovenox shots received at the nursing home.  Gastrointestinal: Negative for hematochezia and melena.  Genitourinary: Negative for hematuria.  All other systems reviewed and are negative.  PHYSICAL EXAM: VS:  BP 126/54 mmHg  Pulse 53  Ht 5' 4"  (1.626 m)  Wt 266 lb (120.657 kg)  BMI 45.64 kg/m2    Wt Readings from Last 3 Encounters:  03/19/14 266 lb (120.657 kg)  02/26/14 261 lb 14.4 oz (118.797 kg)  02/10/14 294 lb 12.8 oz (133.72 kg)     GEN: Well nourished, well developed, in no acute distress HEENT: normal Neck: no JVD, no masses Cardiac:  Normal S1/S2, irreg irreg rhythm; no murmur ,  no rubs or gallops, trace edema  Respiratory:  clear to auscultation bilaterally, no wheezing, rhonchi or rales. GI: nondistended, + BS; large area of ecchymosis lower abdomen - present since receiving Lovenox injections at SNF (patient notes improved)  MS: no deformity or atrophy Skin: warm and dry  Neuro:  CNs II-XII intact, Strength and sensation are intact Psych: Normal affect   EKG:  EKG is ordered today.  It demonstrates:   AFib, HR 53, low voltage   Recent Labs: 10/28/2013: Pro B Natriuretic peptide (BNP) 325.7* 10/29/2013: ALT 21 02/15/2014: B Natriuretic Peptide  75.9 02/23/2014: TSH 0.743 02/25/2014: BUN 38*; Creatinine 1.04; Hemoglobin 11.0*; Platelets 237; Potassium 3.8; Sodium 137 02/26/2014: Magnesium 2.2    Lipid Panel    Component Value Date/Time   CHOL 150 02/11/2014 0750   TRIG 130 02/19/2014 0011   TRIG 177* 07/18/2013 1547   HDL 20* 02/11/2014 0750   HDL 24* 07/18/2013 1547   CHOLHDL 7.5 02/11/2014 0750   VLDL 23 02/11/2014 0750   LDLCALC 107* 02/11/2014 0750   LDLCALC 67 07/18/2013 1547      ASSESSMENT AND PLAN:  Chronic atrial fibrillation Rate is much better controlled. He is now bradycardic. He does admit to some weak spells at times. I have asked him to decrease his digoxin to 0.0625 mg daily. Continue Coumadin. This is managed by primary care. He plans to get this checked later this week. Continue current dose of beta blocker.  Cardiomyopathy This is likely tachycardia mediated. Follow-up echocardiogram will be arranged next month to reassess LV function. Continue beta blocker and ACE inhibitor. Check follow-up basic metabolic panel.  Chronic combined systolic and diastolic CHF (congestive heart failure) Volume remains stable. Continue current dose of Lasix. Check follow-up basic metabolic panel.  Continue O2.  OSA (obstructive sleep apnea) He will be getting a new CPAP machine soon.  History of colon cancer He has follow-up with GI soon to assess for recent history of heme positive stools.  Ecchymosis  This is improving. Obtain CBC today.  Current medicines are reviewed at length with the patient today.  The patient does not have concerns regarding medicines.  The following changes have been made:  As above.   Labs/ tests ordered today include:   Orders Placed This Encounter  Procedures  . Basic Metabolic Panel (BMET)  . CBC  . EKG 12-Lead  . 2D Echocardiogram with contrast    Disposition:   FU with Dr. Quay Burow 4 weeks.    Signed, Versie Starks, MHS 03/19/2014 2:41 PM    [facility] [address], [place], [place]  [address] Phone: [phone]; Fax: [phone]

## 2014-03-19 NOTE — Patient Instructions (Addendum)
Your physician has recommended you make the following change in your medication:   START TAKING 0.0625 DIGOXIN ONCE A DAY  HALF A TABLET      LABS TODAY BMET AND CBC   Your physician has requested that you have an echocardiogram. SCHEDULED WEEK OF 04/13/13 Echocardiography is a painless test that uses sound waves to create images of your heart. It provides your doctor with information about the size and shape of your heart and how well your heart's chambers and valves are working. This procedure takes approximately one hour. There are no restrictions for this procedure.   Your physician recommends that you schedule a follow-up appointment in:   DR J BERRY IN 4 WEEKS AFTER ECHO

## 2014-03-20 ENCOUNTER — Encounter: Payer: Self-pay | Admitting: Nurse Practitioner

## 2014-03-20 ENCOUNTER — Ambulatory Visit (INDEPENDENT_AMBULATORY_CARE_PROVIDER_SITE_OTHER): Payer: BLUE CROSS/BLUE SHIELD | Admitting: Nurse Practitioner

## 2014-03-20 VITALS — BP 113/60 | HR 82 | Temp 97.0°F | Ht 64.0 in | Wt 268.0 lb

## 2014-03-20 DIAGNOSIS — Z7901 Long term (current) use of anticoagulants: Secondary | ICD-10-CM | POA: Diagnosis not present

## 2014-03-20 DIAGNOSIS — I4891 Unspecified atrial fibrillation: Secondary | ICD-10-CM

## 2014-03-20 DIAGNOSIS — Z5181 Encounter for therapeutic drug level monitoring: Secondary | ICD-10-CM

## 2014-03-20 LAB — POCT INR: INR: 1.9

## 2014-03-20 NOTE — Patient Instructions (Signed)
Anticoagulation Dose Instructions as of 03/20/2014      William Flores Tue Wed Thu Fri Sat   New Dose 11.25 mg 20 mg 11.25 mg 11.25 mg 15 mg 15 mg 11.25 mg    Description        Take 2 tablets tonight then continue current dose-      Recheck in 2 weeks

## 2014-03-20 NOTE — Progress Notes (Signed)
   Subjective:    Patient ID: [name], male    DOB: [birth year], 64 y.o.   MRN: [id number]  HPI Patient was discharged from [facility] from rehab for carbon monoxide poisoning- he is doing much better- was started on lovenox along with coumadin- Shots were stopped at discharged - needs INR checked to day to see if in therapuetic range. On 24 hour oxygen for now- saw cardiologist yesterday.    Review of Systems  Constitutional: Negative.   HENT: Negative.   Cardiovascular: Negative.   Genitourinary: Negative.   Neurological: Negative.   Psychiatric/Behavioral: Negative.   All other systems reviewed and are negative.      Objective:   Physical Exam  Constitutional: He is oriented to person, place, and time. He appears well-developed and well-nourished.  Cardiovascular: Normal rate, regular rhythm and normal heart sounds.   Pulmonary/Chest: Effort normal and breath sounds normal.  Neurological: He is alert and oriented to person, place, and time.  Skin: Skin is warm.  Complete echymosis of lower abdominal wall from lovenox injections  Psychiatric: He has a normal mood and affect. His behavior is normal. Judgment and thought content normal.   BP 113/60 mmHg  Pulse 82  Temp(Src) 97 F (36.1 C) (Oral)  Ht 5' 4"  (1.626 m)  Wt 268 lb (121.564 kg)  BMI 45.98 kg/m2        Assessment & Plan:

## 2014-03-21 ENCOUNTER — Telehealth: Payer: Self-pay | Admitting: *Deleted

## 2014-03-21 ENCOUNTER — Telehealth: Payer: Self-pay | Admitting: Physician Assistant

## 2014-03-21 DIAGNOSIS — I5032 Chronic diastolic (congestive) heart failure: Secondary | ICD-10-CM

## 2014-03-21 MED ORDER — LISINOPRIL 5 MG PO TABS: ORAL_TABLET | ORAL | Status: DC

## 2014-03-21 MED ORDER — POTASSIUM CHLORIDE CRYS ER 20 MEQ PO TBCR: 10.0000 meq | EXTENDED_RELEASE_TABLET | Freq: Every day | ORAL | Status: DC

## 2014-03-21 NOTE — Telephone Encounter (Signed)
lmptcb x 2 to go over lab results and med changes

## 2014-03-21 NOTE — Telephone Encounter (Signed)
pt notified about lab results and med changes with verbal read back  x 3 on instructions. bmet, cbc 3/25.

## 2014-03-21 NOTE — Telephone Encounter (Signed)
ptcb to let me know that he did not have any K+ since he has been home from the nursing facility. I advised since his K+ was still running closer to the high end of normal to cont as instructed earlier. Rx K+ sent in.

## 2014-03-21 NOTE — Addendum Note (Signed)
Addended by: Michae Kava on: 03/21/2014 03:31 PM   Modules accepted: Orders

## 2014-03-24 ENCOUNTER — Other Ambulatory Visit: Payer: Self-pay | Admitting: Family Medicine

## 2014-03-25 ENCOUNTER — Other Ambulatory Visit (INDEPENDENT_AMBULATORY_CARE_PROVIDER_SITE_OTHER): Payer: BLUE CROSS/BLUE SHIELD | Admitting: *Deleted

## 2014-03-25 DIAGNOSIS — I5032 Chronic diastolic (congestive) heart failure: Secondary | ICD-10-CM | POA: Diagnosis not present

## 2014-03-25 LAB — BASIC METABOLIC PANEL
BUN: 28 mg/dL — ABNORMAL HIGH (ref 6–23)
CALCIUM: 9.5 mg/dL (ref 8.4–10.5)
CO2: 34 mEq/L — ABNORMAL HIGH (ref 19–32)
CREATININE: 1.35 mg/dL (ref 0.40–1.50)
Chloride: 98 mEq/L (ref 96–112)
GFR: 56.89 mL/min — AB (ref >60.00)
Glucose, Bld: 126 mg/dL — ABNORMAL HIGH (ref 70–99)
Potassium: 3.7 mEq/L (ref 3.5–5.1)
SODIUM: 138 meq/L (ref 135–145)

## 2014-03-25 LAB — CBC WITH DIFFERENTIAL/PLATELET
BASOS ABS: 0 10*3/uL (ref 0.0–0.1)
Basophils Relative: 0.7 % (ref 0.0–3.0)
EOS ABS: 0.1 10*3/uL (ref 0.0–0.7)
Eosinophils Relative: 1.5 % (ref 0.0–5.0)
HEMATOCRIT: 29.9 % — AB (ref 39.0–52.0)
Hemoglobin: 9.2 g/dL — ABNORMAL LOW (ref 13.0–17.0)
LYMPHS ABS: 0.9 10*3/uL (ref 0.7–4.0)
Lymphocytes Relative: 15.1 % (ref 12.0–46.0)
MCHC: 30.7 g/dL (ref 30.0–36.0)
MCV: 85.2 fl (ref 78.0–100.0)
Monocytes Absolute: 0.4 10*3/uL (ref 0.1–1.0)
Monocytes Relative: 7.8 % (ref 3.0–12.0)
NEUTROS PCT: 74.9 % (ref 43.0–77.0)
Neutro Abs: 4.2 10*3/uL (ref 1.4–7.7)
PLATELETS: 253 10*3/uL (ref 150.0–400.0)
RBC: 3.51 Mil/uL — ABNORMAL LOW (ref 4.22–5.81)
RDW: 28.8 % — AB (ref 11.5–15.5)
WBC: 5.7 10*3/uL (ref 4.0–10.5)

## 2014-03-26 NOTE — Telephone Encounter (Signed)
New message    Patient calling returning the nurses called.

## 2014-03-27 ENCOUNTER — Other Ambulatory Visit: Payer: Self-pay | Admitting: *Deleted

## 2014-03-27 DIAGNOSIS — I5032 Chronic diastolic (congestive) heart failure: Secondary | ICD-10-CM

## 2014-03-27 MED ORDER — LISINOPRIL 5 MG PO TABS: ORAL_TABLET | ORAL | Status: DC

## 2014-03-27 NOTE — Telephone Encounter (Signed)
ptcb and said he needed a new rx for the lisinopril called in. Rx sent in today

## 2014-03-27 NOTE — Telephone Encounter (Signed)
Pt returned call

## 2014-03-27 NOTE — Telephone Encounter (Signed)
pt notified of lab results and med changes. Will get repeat BMET 3/31 when he see's [facility]. Pt read back x 2 new directions on med changes with verbal understanding.

## 2014-03-27 NOTE — Telephone Encounter (Signed)
F/U      Pt is calling to get lab results.    Please return pt call.

## 2014-03-28 ENCOUNTER — Other Ambulatory Visit: Payer: BLUE CROSS/BLUE SHIELD

## 2014-04-02 ENCOUNTER — Encounter: Payer: Self-pay | Admitting: *Deleted

## 2014-04-03 ENCOUNTER — Ambulatory Visit: Payer: BLUE CROSS/BLUE SHIELD | Admitting: Nurse Practitioner

## 2014-04-04 ENCOUNTER — Other Ambulatory Visit: Payer: Self-pay | Admitting: *Deleted

## 2014-04-04 MED ORDER — BENZONATATE 100 MG PO CAPS: 100.0000 mg | ORAL_CAPSULE | Freq: Two times a day (BID) | ORAL | Status: DC

## 2014-04-13 ENCOUNTER — Inpatient Hospital Stay (HOSPITAL_COMMUNITY)
Admission: EM | Admit: 2014-04-13 | Discharge: 2014-04-23 | DRG: 682 | Disposition: A | Discharge status: Skilled Nursing Facility | Payer: BLUE CROSS/BLUE SHIELD | Attending: Internal Medicine | Admitting: Internal Medicine

## 2014-04-13 ENCOUNTER — Emergency Department (HOSPITAL_COMMUNITY): Payer: BLUE CROSS/BLUE SHIELD

## 2014-04-13 ENCOUNTER — Encounter (HOSPITAL_COMMUNITY): Payer: Self-pay | Admitting: Emergency Medicine

## 2014-04-13 DIAGNOSIS — E872 Acidosis, unspecified: Secondary | ICD-10-CM

## 2014-04-13 DIAGNOSIS — G4733 Obstructive sleep apnea (adult) (pediatric): Secondary | ICD-10-CM

## 2014-04-13 DIAGNOSIS — E860 Dehydration: Secondary | ICD-10-CM | POA: Diagnosis present

## 2014-04-13 DIAGNOSIS — I50810 Right heart failure, unspecified: Secondary | ICD-10-CM

## 2014-04-13 DIAGNOSIS — N179 Acute kidney failure, unspecified: Secondary | ICD-10-CM | POA: Diagnosis not present

## 2014-04-13 DIAGNOSIS — E876 Hypokalemia: Secondary | ICD-10-CM | POA: Diagnosis not present

## 2014-04-13 DIAGNOSIS — E785 Hyperlipidemia, unspecified: Secondary | ICD-10-CM

## 2014-04-13 DIAGNOSIS — E1129 Type 2 diabetes mellitus with other diabetic kidney complication: Secondary | ICD-10-CM | POA: Diagnosis present

## 2014-04-13 DIAGNOSIS — E1165 Type 2 diabetes mellitus with hyperglycemia: Secondary | ICD-10-CM | POA: Diagnosis present

## 2014-04-13 DIAGNOSIS — E874 Mixed disorder of acid-base balance: Secondary | ICD-10-CM | POA: Diagnosis present

## 2014-04-13 DIAGNOSIS — Z452 Encounter for adjustment and management of vascular access device: Secondary | ICD-10-CM

## 2014-04-13 DIAGNOSIS — I4891 Unspecified atrial fibrillation: Secondary | ICD-10-CM

## 2014-04-13 DIAGNOSIS — R579 Shock, unspecified: Secondary | ICD-10-CM

## 2014-04-13 DIAGNOSIS — I5042 Chronic combined systolic (congestive) and diastolic (congestive) heart failure: Secondary | ICD-10-CM

## 2014-04-13 DIAGNOSIS — I428 Other cardiomyopathies: Secondary | ICD-10-CM

## 2014-04-13 DIAGNOSIS — E1122 Type 2 diabetes mellitus with diabetic chronic kidney disease: Secondary | ICD-10-CM | POA: Diagnosis present

## 2014-04-13 DIAGNOSIS — I482 Chronic atrial fibrillation, unspecified: Secondary | ICD-10-CM

## 2014-04-13 DIAGNOSIS — Z85038 Personal history of other malignant neoplasm of large intestine: Secondary | ICD-10-CM

## 2014-04-13 DIAGNOSIS — J9601 Acute respiratory failure with hypoxia: Secondary | ICD-10-CM | POA: Diagnosis not present

## 2014-04-13 DIAGNOSIS — N189 Chronic kidney disease, unspecified: Secondary | ICD-10-CM

## 2014-04-13 DIAGNOSIS — R570 Cardiogenic shock: Secondary | ICD-10-CM | POA: Diagnosis not present

## 2014-04-13 DIAGNOSIS — R1310 Dysphagia, unspecified: Secondary | ICD-10-CM

## 2014-04-13 DIAGNOSIS — E875 Hyperkalemia: Secondary | ICD-10-CM

## 2014-04-13 DIAGNOSIS — E1142 Type 2 diabetes mellitus with diabetic polyneuropathy: Secondary | ICD-10-CM | POA: Diagnosis present

## 2014-04-13 DIAGNOSIS — I10 Essential (primary) hypertension: Secondary | ICD-10-CM

## 2014-04-13 DIAGNOSIS — R001 Bradycardia, unspecified: Secondary | ICD-10-CM

## 2014-04-13 DIAGNOSIS — D500 Iron deficiency anemia secondary to blood loss (chronic): Secondary | ICD-10-CM

## 2014-04-13 DIAGNOSIS — G9340 Encephalopathy, unspecified: Secondary | ICD-10-CM | POA: Diagnosis present

## 2014-04-13 DIAGNOSIS — L304 Erythema intertrigo: Secondary | ICD-10-CM | POA: Diagnosis present

## 2014-04-13 DIAGNOSIS — Z978 Presence of other specified devices: Secondary | ICD-10-CM

## 2014-04-13 DIAGNOSIS — I429 Cardiomyopathy, unspecified: Secondary | ICD-10-CM | POA: Diagnosis present

## 2014-04-13 DIAGNOSIS — Z7901 Long term (current) use of anticoagulants: Secondary | ICD-10-CM

## 2014-04-13 DIAGNOSIS — Z6841 Body Mass Index (BMI) 40.0 and over, adult: Secondary | ICD-10-CM

## 2014-04-13 DIAGNOSIS — I129 Hypertensive chronic kidney disease with stage 1 through stage 4 chronic kidney disease, or unspecified chronic kidney disease: Secondary | ICD-10-CM | POA: Diagnosis present

## 2014-04-13 DIAGNOSIS — IMO0002 Reserved for concepts with insufficient information to code with codable children: Secondary | ICD-10-CM

## 2014-04-13 DIAGNOSIS — N183 Chronic kidney disease, stage 3 (moderate): Secondary | ICD-10-CM | POA: Diagnosis present

## 2014-04-13 DIAGNOSIS — E662 Morbid (severe) obesity with alveolar hypoventilation: Secondary | ICD-10-CM | POA: Diagnosis present

## 2014-04-13 DIAGNOSIS — Z87891 Personal history of nicotine dependence: Secondary | ICD-10-CM

## 2014-04-13 DIAGNOSIS — J9602 Acute respiratory failure with hypercapnia: Secondary | ICD-10-CM | POA: Diagnosis present

## 2014-04-13 DIAGNOSIS — I5023 Acute on chronic systolic (congestive) heart failure: Secondary | ICD-10-CM

## 2014-04-13 DIAGNOSIS — Z7982 Long term (current) use of aspirin: Secondary | ICD-10-CM

## 2014-04-13 LAB — I-STAT CHEM 8, ED
BUN: 99 mg/dL — ABNORMAL HIGH (ref 6–23)
BUN: 90 mg/dL — ABNORMAL HIGH (ref 6–23)
CHLORIDE: 109 mmol/L (ref 96–112)
Calcium, Ion: 1.14 mmol/L (ref 1.13–1.30)
Calcium, Ion: 1.23 mmol/L (ref 1.13–1.30)
Chloride: 105 mmol/L (ref 96–112)
Creatinine, Ser: 5.8 mg/dL — ABNORMAL HIGH (ref 0.50–1.35)
Creatinine, Ser: 5.3 mg/dL — ABNORMAL HIGH (ref 0.50–1.35)
GLUCOSE: 192 mg/dL — AB (ref 70–99)
Glucose, Bld: 217 mg/dL — ABNORMAL HIGH (ref 70–99)
HCT: 38 % — ABNORMAL LOW (ref 39.0–52.0)
HCT: 31 % — ABNORMAL LOW (ref 39.0–52.0)
Hemoglobin: 12.9 g/dL — ABNORMAL LOW (ref 13.0–17.0)
Hemoglobin: 10.5 g/dL — ABNORMAL LOW (ref 13.0–17.0)
Potassium: 6.6 mmol/L (ref 3.5–5.1)
Potassium: 5.6 mmol/L — ABNORMAL HIGH (ref 3.5–5.1)
Sodium: 134 mmol/L — ABNORMAL LOW (ref 135–145)
Sodium: 135 mmol/L (ref 135–145)
TCO2: 16 mmol/L (ref 0–100)
TCO2: 14 mmol/L (ref 0–100)

## 2014-04-13 LAB — I-STAT ARTERIAL BLOOD GAS, ED
Acid-base deficit: 15 mmol/L — ABNORMAL HIGH (ref 0.0–2.0)
Acid-base deficit: 15 mmol/L — ABNORMAL HIGH (ref 0.0–2.0)
BICARBONATE: 16 meq/L — AB (ref 20.0–24.0)
Bicarbonate: 15.1 mEq/L — ABNORMAL LOW (ref 20.0–24.0)
O2 SAT: 96 %
O2 Saturation: 96 %
PO2 ART: 99 mmHg (ref 80.0–100.0)
PO2 ART: 118 mmHg — AB (ref 80.0–100.0)
Patient temperature: 93.4
Patient temperature: 98.6
TCO2: 18 mmol/L (ref 0–100)
TCO2: 17 mmol/L (ref 0–100)
pCO2 arterial: 53.7 mmHg — ABNORMAL HIGH (ref 35.0–45.0)
pCO2 arterial: 51.3 mmHg — ABNORMAL HIGH (ref 35.0–45.0)
pH, Arterial: 7.064 — CL (ref 7.350–7.450)
pH, Arterial: 7.077 — CL (ref 7.350–7.450)

## 2014-04-13 LAB — I-STAT CG4 LACTIC ACID, ED: LACTIC ACID, VENOUS: 5.68 mmol/L — AB (ref 0.5–2.0)

## 2014-04-13 LAB — I-STAT TROPONIN, ED: TROPONIN I, POC: 0 ng/mL (ref 0.00–0.08)

## 2014-04-13 LAB — CBC WITH DIFFERENTIAL/PLATELET
BASOS PCT: 1 % (ref 0–1)
Basophils Absolute: 0.1 10*3/uL (ref 0.0–0.1)
Eosinophils Absolute: 0.1 10*3/uL (ref 0.0–0.7)
Eosinophils Relative: 1 % (ref 0–5)
HCT: 36.6 % — ABNORMAL LOW (ref 39.0–52.0)
HEMOGLOBIN: 9.8 g/dL — AB (ref 13.0–17.0)
LYMPHS PCT: 16 % (ref 12–46)
Lymphs Abs: 2 10*3/uL (ref 0.7–4.0)
MCH: 25.1 pg — ABNORMAL LOW (ref 26.0–34.0)
MCHC: 26.8 g/dL — ABNORMAL LOW (ref 30.0–36.0)
MCV: 93.6 fL (ref 78.0–100.0)
Monocytes Absolute: 1.1 10*3/uL — ABNORMAL HIGH (ref 0.1–1.0)
Monocytes Relative: 9 % (ref 3–12)
NEUTROS ABS: 9.2 10*3/uL — AB (ref 1.7–7.7)
Neutrophils Relative %: 73 % (ref 43–77)
Platelets: 471 10*3/uL — ABNORMAL HIGH (ref 150–400)
RBC: 3.91 MIL/uL — AB (ref 4.22–5.81)
RDW: 24.5 % — ABNORMAL HIGH (ref 11.5–15.5)
Smear Review: ADEQUATE
WBC: 12.5 10*3/uL — AB (ref 4.0–10.5)

## 2014-04-13 LAB — DIGOXIN LEVEL

## 2014-04-13 LAB — PROTIME-INR
INR: 5.96 — AB (ref 0.00–1.49)
Prothrombin Time: 53.6 seconds — ABNORMAL HIGH (ref 11.6–15.2)

## 2014-04-13 LAB — ETHANOL: Alcohol, Ethyl (B): <5 mg/dL (ref 0–9)

## 2014-04-13 LAB — BRAIN NATRIURETIC PEPTIDE: B NATRIURETIC PEPTIDE 5: 467.9 pg/mL — AB (ref 0.0–100.0)

## 2014-04-13 MED ORDER — ETOMIDATE 2 MG/ML IV SOLN
INTRAVENOUS | Status: AC
  Administered 2014-04-13: 22:00:00
  Filled 2014-04-13: qty 20

## 2014-04-13 MED ORDER — ATROPINE SULFATE 0.1 MG/ML IJ SOLN
0.5000 mg | Freq: Once | INTRAMUSCULAR | Status: AC
  Administered 2014-04-13: 0.5 mg via INTRAVENOUS

## 2014-04-13 MED ORDER — DEXTROSE 50 % IV SOLN
1.0000 | Freq: Once | INTRAVENOUS | Status: AC
  Administered 2014-04-13: 50 mL via INTRAVENOUS
  Filled 2014-04-13: qty 50

## 2014-04-13 MED ORDER — CALCIUM CHLORIDE 10 % IV SOLN
INTRAVENOUS | Status: AC
  Administered 2014-04-13: 1 g via INTRAVENOUS
  Filled 2014-04-13: qty 10

## 2014-04-13 MED ORDER — SODIUM BICARBONATE 8.4 % IV SOLN
INTRAVENOUS | Status: DC
  Administered 2014-04-13: via INTRAVENOUS
  Filled 2014-04-13 (×2): qty 150

## 2014-04-13 MED ORDER — KETAMINE HCL 10 MG/ML IJ SOLN
200.0000 mg | INTRAMUSCULAR | Status: AC
  Administered 2014-04-14: 200 mg via INTRAVENOUS
  Filled 2014-04-13: qty 20

## 2014-04-13 MED ORDER — CALCIUM CHLORIDE 10 % IV SOLN
1.0000 g | Freq: Once | INTRAVENOUS | Status: AC
  Administered 2014-04-14: 1 g via INTRAVENOUS
  Filled 2014-04-13: qty 10

## 2014-04-13 MED ORDER — INSULIN ASPART 100 UNIT/ML ~~LOC~~ SOLN
5.0000 [IU] | Freq: Once | SUBCUTANEOUS | Status: AC
  Administered 2014-04-13: 5 [IU] via INTRAVENOUS
  Filled 2014-04-13: qty 1

## 2014-04-13 MED ORDER — ATROPINE SULFATE 0.1 MG/ML IJ SOLN
INTRAMUSCULAR | Status: AC
  Administered 2014-04-14: 02:00:00
  Filled 2014-04-13: qty 10

## 2014-04-13 MED ORDER — EPINEPHRINE HCL 1 MG/ML IJ SOLN
0.5000 ug/min | INTRAVENOUS | Status: DC
  Administered 2014-04-13: 0.5 ug/min via INTRAVENOUS
  Filled 2014-04-13: qty 4

## 2014-04-13 MED ORDER — ROCURONIUM BROMIDE 50 MG/5ML IV SOLN
INTRAVENOUS | Status: AC
  Administered 2014-04-13: 22:00:00
  Filled 2014-04-13: qty 2

## 2014-04-13 MED ORDER — SODIUM BICARBONATE 8.4 % IV SOLN
INTRAVENOUS | Status: AC
  Administered 2014-04-13
  Filled 2014-04-13: qty 100

## 2014-04-13 MED ORDER — SODIUM CHLORIDE 0.9 % IV BOLUS (SEPSIS)
1000.0000 mL | Freq: Once | INTRAVENOUS | Status: AC
  Administered 2014-04-13: 1000 mL via INTRAVENOUS

## 2014-04-13 MED ORDER — SODIUM BICARBONATE 8.4 % IV SOLN
100.0000 meq | Freq: Once | INTRAVENOUS | Status: AC
  Administered 2014-04-14: 100 meq via INTRAVENOUS
  Filled 2014-04-13: qty 100

## 2014-04-13 MED ORDER — SODIUM CHLORIDE 0.9 % IV SOLN
10.0000 | Freq: Once | INTRAVENOUS | Status: AC
  Administered 2014-04-13: 10 via INTRAVENOUS
  Filled 2014-04-13: qty 400

## 2014-04-13 MED ORDER — CALCIUM CHLORIDE 10 % IV SOLN
1.0000 g | Freq: Once | INTRAVENOUS | Status: AC
Start: 2014-04-13 — End: 2014-04-13
  Administered 2014-04-13 (×2): 1 g via INTRAVENOUS

## 2014-04-13 MED ORDER — ALBUTEROL SULFATE (2.5 MG/3ML) 0.083% IN NEBU
10.0000 mg | INHALATION_SOLUTION | Freq: Once | RESPIRATORY_TRACT | Status: AC
  Administered 2014-04-13: 10 mg via RESPIRATORY_TRACT
  Filled 2014-04-13: qty 12

## 2014-04-13 MED ORDER — SODIUM BICARBONATE 8.4 % IV SOLN
100.0000 meq | Freq: Once | INTRAVENOUS | Status: AC
  Administered 2014-04-13: 100 meq via INTRAVENOUS

## 2014-04-13 MED ORDER — SUCCINYLCHOLINE CHLORIDE 20 MG/ML IJ SOLN
INTRAMUSCULAR | Status: AC
  Administered 2014-04-13: 22:00:00
  Filled 2014-04-13: qty 1

## 2014-04-13 MED ORDER — SODIUM CHLORIDE 0.9 % IV SOLN
1.0000 g | INTRAVENOUS | Status: DC
  Filled 2014-04-13: qty 10

## 2014-04-13 MED ORDER — ATROPINE SULFATE 0.1 MG/ML IJ SOLN
0.5000 mg | Freq: Once | INTRAMUSCULAR | Status: AC
  Administered 2014-04-13: 0.5 mg via INTRAVENOUS
  Filled 2014-04-13: qty 10

## 2014-04-13 MED ORDER — LIDOCAINE HCL (CARDIAC) 20 MG/ML IV SOLN
INTRAVENOUS | Status: AC
  Administered 2014-04-13: 22:00:00
  Filled 2014-04-13: qty 5

## 2014-04-13 NOTE — ED Notes (Addendum)
EMS reports called out to patients house for SOB and weakness x 3 weeks and diarrhea x 3 days; pt denies CP, palpitations; EMS reports pt was hypotensive upon arrival with BP 50/pal and 85% on 15L NRB; pt CAOx 4 at this time; EMS reports hx of CO poisoning "couple weeks ago and was seen here for same"

## 2014-04-13 NOTE — ED Notes (Signed)
Fenton Malling, RN called pharm for STAT delivery of ketamine to [facility]

## 2014-04-13 NOTE — ED Notes (Signed)
RRT at bedside for ABG

## 2014-04-13 NOTE — Consult Note (Signed)
Cardiology Consult Note  Admit date: 04/13/2014 Name: William Flores 64 y.o.  male DOB:  02-Jan-1951 MRN:  [id number]  Today's date:  04/13/2014  Referring Physician:    [facility]  Primary Physician: [place]  Reason for Consultation:    Bradycardia  IMPRESSIONS: 1.  Chronic atrial fibrillation with slow ventricular response currently likely secondary to acute renal failure, hyperkalemia and metabolic derangements 2.  Chronic systolic and diastolic congestive heart failure 3.  Acute renal failure 4.  Metabolic acidosis 5.  Hypotension of uncertain cause 6.  Type 2 diabetes mellitus with chronic kidney disease 7.  Sleep apnea and obesity hypoventilation syndrome 8.  Morbid obesity 9.  Coagulopathy  RECOMMENDATION: 1.  Correct metabolic derangements.  I spoke with the critical care physician about a temporary pacemaker.  His morbid obesity and coagulopathy make this more difficult and would need to be done in the cath lab.  He has evidence of acute renal failure and his heart rate has come up after bicarbonates of I feel it would be best to treat this with correcting the metabolic arrangements at this time 2.  Treat acute renal failure and coagulopathy 3.  Cardiology will follow along with you.  May cycle troponins to be sure he does not have been extreme elevation although it is likely that he may have a mild elevation due to his metabolic and hemodynamic derangements. 4.  Supportive care of hypotension.  Looks like he may be heading for intubation 5.  Discontinue ACE inhibitor since he has renal failure and hopefully with blood pressure support and perhaps kidney function may improve with dialysis.  Dialysis may be necessary to correct the acidosis.   HISTORY: This 64 year old male has a history of previously chronic diastolic heart failure and obesity hypoventilation syndrome as well as diabetes with mild chronic kidney disease.  He has chronic  atrial fibrillation for a number of years.  He was admitted in February with what was thought to be diastolic heart failure and that time was found to have an ejection fraction of 35-40%.  He was intubated for acute respiratory failure and had to be intubated a second time and eventually was discharged to the nursing home and was switched over to warfarin.  He has been seen in consultation by cardiology since he was discharged and renal function was at baseline a little under 3 weeks ago.  He presented to the emergency room when EMS came and found him to be hypotensive on arrival with a blood pressure of 50 and oxygen saturation of 85% on 15 L nonrebreathing.  He had weakness for 3 weeks and had had diarrhea for 3 days.  He was significantly bradycardic on admission and was found to have significant lactic acidosis as well as combined metabolic and respiratory acidosis.  He has been given bicarbonate and his heart rate has come up into the 50s and 60s now.  He was given fluid resuscitation and his blood pressure has come up although he has remained with a borderline blood pressure.    Past Medical History  Diagnosis Date  . Diabetes   . Colon cancer   . Hypertension   . Sleep apnea   . Atrial fibrillation   . Hyperlipidemia   . CHF (congestive heart failure)   . Colon polyps 09/21/2012    Tubular adenoma      Past Surgical History  Procedure Laterality Date  . Colon resection    . Circumcision    .  Colonoscopy N/A 09/21/2012    Procedure: COLONOSCOPY;  Surgeon: Inda Castle, MD;  Location: [facility];  Service: Endoscopy;  Laterality: N/A;  . US echocardiography  03/04/2010    abnormal study    Allergies:  is allergic to codeine.   Medications: Prior to Admission medications   Medication Sig Start Date End Date Taking? Authorizing Provider  allopurinol (ZYLOPRIM) 300 MG tablet TAKE 1 TABLET (300 MG TOTAL) BY MOUTH DAILY. ALSO TAKE 100MG DAILY TO EQUAL 400MG TOTAL. 01/01/14   Wardell Honour, MD  aspirin 81 MG tablet Take 81 mg by mouth daily.    Historical Provider, MD  atorvastatin (LIPITOR) 20 MG tablet Take 1 tablet (20 mg total) by mouth daily at 6 PM. 02/26/14   Janece Canterbury, MD  azelastine (OPTIVAR) 0.05 % ophthalmic solution Place 2 drops into both eyes 2 (two) times daily. 04/19/13   Vernie Shanks, MD  benzonatate (TESSALON) 100 MG capsule Take 1 capsule (100 mg total) by mouth 2 (two) times daily. 04/04/14   Mary-Margaret Hassell Done, FNP  ferrous sulfate 325 (65 FE) MG tablet Take 1 tablet (325 mg total) by mouth 2 (two) times daily with a meal. 02/26/14   Janece Canterbury, MD  furosemide (LASIX) 40 MG tablet Take 40 mg by mouth daily. 01/18/14   Historical Provider, MD  glucose blood test strip Use to check BG daily 10/07/13   Tammy Eckard, PHARMD  lisinopril (PRINIVIL,ZESTRIL) 5 MG tablet 1 tab daily 03/27/14   Liliane Shi, PA-C  metolazone (ZAROXOLYN) 2.5 MG tablet TAKE 1 TABLET (2.5 MG TOTAL) BY MOUTH DAILY. 03/24/14   Mary-Margaret Hassell Done, FNP  metoprolol succinate (TOPROL-XL) 50 MG 24 hr tablet Take 1 tablet (50 mg total) by mouth 2 (two) times daily. Take with or immediately following a meal. 02/26/14   Janece Canterbury, MD  omeprazole (PRILOSEC) 20 MG capsule TAKE 1 CAPSULE (20 MG TOTAL) BY MOUTH DAILY. 12/17/13   Lysbeth Penner, FNP  [facility] DELICA LANCETS 55D MISC 1 each by Does not apply route daily. Use one lancet to check BG daily 09/13/12   Tammy Eckard, PHARMD  sitaGLIPtin-metformin (JANUMET) 50-1000 MG per tablet Take 1 tablet by mouth 2 (two) times daily with a meal. 08/05/13   Lysbeth Penner, FNP  warfarin (COUMADIN) 10 MG tablet Take 1 tablet (10 mg total) by mouth daily at 6 PM. 02/26/14   Janece Canterbury, MD    Family History: No family status information on file.   unable to obtain due to obtunded condition  Social History:   reports that he quit smoking about 30 years ago. He has never used smokeless tobacco. He reports that he does not drink alcohol  or use illicit drugs.     Review of Systems: Unable to obtain due to obtunded condition  Physical Exam: BP 93/40 mmHg  Pulse 61  Temp(Src) 93.4 F (34.1 C) (Rectal)  Resp 25  SpO2 98%  General appearance: Obese very large white male currently on BiPAP and unable to give a history, is moving extremities purposefully Head: Normocephalic, without obvious abnormality, atraumatic  Eyes: Pupils are equal and reactive bilaterally  Neck: no adenopathy and Difficult to assess JVD, because of respiratory status cannot hear carotid bruits Lungs: Reduced breath sounds at the bases Heart: Slow irregular rhythm, distant heart sounds, no definite murmur, no S3 heard Abdomen: Extremely obese large nontender, midline scar noted ecchymoses present in mid abdomen Rectal: deferred Extremities: 1+ edema, changes of venous stasis noted, no  deformity, no lesions Pulses: 2+ and symmetric Skin: Scattered ecchymoses noted Neurologic: Moves all extremities  Labs: CBC  Recent Labs  04/13/14 2140  04/13/14 2323  WBC 12.5*  --   --   RBC 3.91*  --   --   HGB 9.8*  < > 10.5*  HCT 36.6*  < > 31.0*  PLT 471*  --   --   MCV 93.6  --   --   MCH 25.1*  --   --   MCHC 26.8*  --   --   RDW 24.5*  --   --   LYMPHSABS 2.0  --   --   MONOABS 1.1*  --   --   EOSABS 0.1  --   --   BASOSABS 0.1  --   --   < > = values in this interval not displayed. CMP   Recent Labs  04/13/14 2140  04/13/14 2323  NA 134*  < > 135  K 6.8*  < > 5.6*  CL 102  < > 109  CO2 20  --   --   GLUCOSE 196*  < > 217*  BUN 89*  < > 90*  CREATININE 5.66*  < > 5.30*  CALCIUM 8.6  --   --   PROT 5.8*  --   --   ALBUMIN 3.1*  --   --   AST 26  --   --   ALT 20  --   --   ALKPHOS 69  --   --   BILITOT 0.6  --   --   GFRNONAA 10*  --   --   GFRAA 11*  --   --   < > = values in this interval not displayed.   BNP (last 3 results)    Component Value Date/Time   BNP 467.9* 04/13/2014 2140   BNP 105.5* 11/13/2013 1121     Radiology: Cardiomegaly, hazy density at bases, possible effusions.  EKG: Atrial fibrillation with slow ventricular response, low voltage, IV conduction delay, nonspecific ST changes  Signed:  W. Doristine Church MD [facility]   Cardiology Consultant  04/13/2014, 11:52 PM

## 2014-04-13 NOTE — ED Notes (Signed)
MD McQuad at bedside to place triple lumen CVC.

## 2014-04-13 NOTE — ED Notes (Signed)
zoll pads placed at this time per Jodi Mourning MD verbal order

## 2014-04-13 NOTE — ED Notes (Signed)
Dr Ashok Cordia given a copy of chem 8 results and lactic acid results 5.68

## 2014-04-14 ENCOUNTER — Inpatient Hospital Stay (HOSPITAL_COMMUNITY): Payer: BLUE CROSS/BLUE SHIELD

## 2014-04-14 ENCOUNTER — Emergency Department (HOSPITAL_COMMUNITY): Payer: BLUE CROSS/BLUE SHIELD

## 2014-04-14 ENCOUNTER — Inpatient Hospital Stay (HOSPITAL_COMMUNITY): Admission: RE | Admit: 2014-04-14 | Payer: BLUE CROSS/BLUE SHIELD | Source: Ambulatory Visit

## 2014-04-14 DIAGNOSIS — R579 Shock, unspecified: Secondary | ICD-10-CM | POA: Diagnosis not present

## 2014-04-14 DIAGNOSIS — I4891 Unspecified atrial fibrillation: Secondary | ICD-10-CM | POA: Diagnosis present

## 2014-04-14 DIAGNOSIS — E872 Acidosis: Secondary | ICD-10-CM

## 2014-04-14 DIAGNOSIS — Z7982 Long term (current) use of aspirin: Secondary | ICD-10-CM | POA: Diagnosis not present

## 2014-04-14 DIAGNOSIS — I482 Chronic atrial fibrillation: Secondary | ICD-10-CM | POA: Diagnosis present

## 2014-04-14 DIAGNOSIS — R570 Cardiogenic shock: Secondary | ICD-10-CM | POA: Diagnosis present

## 2014-04-14 DIAGNOSIS — R1310 Dysphagia, unspecified: Secondary | ICD-10-CM | POA: Diagnosis present

## 2014-04-14 DIAGNOSIS — I129 Hypertensive chronic kidney disease with stage 1 through stage 4 chronic kidney disease, or unspecified chronic kidney disease: Secondary | ICD-10-CM | POA: Diagnosis present

## 2014-04-14 DIAGNOSIS — E860 Dehydration: Secondary | ICD-10-CM | POA: Diagnosis present

## 2014-04-14 DIAGNOSIS — E1165 Type 2 diabetes mellitus with hyperglycemia: Secondary | ICD-10-CM | POA: Diagnosis present

## 2014-04-14 DIAGNOSIS — E1122 Type 2 diabetes mellitus with diabetic chronic kidney disease: Secondary | ICD-10-CM | POA: Diagnosis present

## 2014-04-14 DIAGNOSIS — E874 Mixed disorder of acid-base balance: Secondary | ICD-10-CM | POA: Diagnosis present

## 2014-04-14 DIAGNOSIS — Z85038 Personal history of other malignant neoplasm of large intestine: Secondary | ICD-10-CM | POA: Diagnosis not present

## 2014-04-14 DIAGNOSIS — R001 Bradycardia, unspecified: Secondary | ICD-10-CM | POA: Diagnosis present

## 2014-04-14 DIAGNOSIS — L304 Erythema intertrigo: Secondary | ICD-10-CM | POA: Diagnosis present

## 2014-04-14 DIAGNOSIS — E1142 Type 2 diabetes mellitus with diabetic polyneuropathy: Secondary | ICD-10-CM | POA: Diagnosis present

## 2014-04-14 DIAGNOSIS — Z87891 Personal history of nicotine dependence: Secondary | ICD-10-CM | POA: Diagnosis not present

## 2014-04-14 DIAGNOSIS — I5023 Acute on chronic systolic (congestive) heart failure: Secondary | ICD-10-CM | POA: Diagnosis present

## 2014-04-14 DIAGNOSIS — G9340 Encephalopathy, unspecified: Secondary | ICD-10-CM | POA: Diagnosis present

## 2014-04-14 DIAGNOSIS — E876 Hypokalemia: Secondary | ICD-10-CM | POA: Diagnosis not present

## 2014-04-14 DIAGNOSIS — J9602 Acute respiratory failure with hypercapnia: Secondary | ICD-10-CM | POA: Diagnosis present

## 2014-04-14 DIAGNOSIS — N189 Chronic kidney disease, unspecified: Secondary | ICD-10-CM

## 2014-04-14 DIAGNOSIS — Z7901 Long term (current) use of anticoagulants: Secondary | ICD-10-CM | POA: Diagnosis not present

## 2014-04-14 DIAGNOSIS — Z6841 Body Mass Index (BMI) 40.0 and over, adult: Secondary | ICD-10-CM | POA: Diagnosis not present

## 2014-04-14 DIAGNOSIS — I10 Essential (primary) hypertension: Secondary | ICD-10-CM | POA: Diagnosis not present

## 2014-04-14 DIAGNOSIS — N179 Acute kidney failure, unspecified: Secondary | ICD-10-CM | POA: Diagnosis present

## 2014-04-14 DIAGNOSIS — E662 Morbid (severe) obesity with alveolar hypoventilation: Secondary | ICD-10-CM | POA: Diagnosis present

## 2014-04-14 DIAGNOSIS — N183 Chronic kidney disease, stage 3 (moderate): Secondary | ICD-10-CM | POA: Diagnosis present

## 2014-04-14 DIAGNOSIS — I429 Cardiomyopathy, unspecified: Secondary | ICD-10-CM | POA: Diagnosis present

## 2014-04-14 DIAGNOSIS — E875 Hyperkalemia: Secondary | ICD-10-CM | POA: Diagnosis present

## 2014-04-14 DIAGNOSIS — I509 Heart failure, unspecified: Secondary | ICD-10-CM

## 2014-04-14 DIAGNOSIS — E785 Hyperlipidemia, unspecified: Secondary | ICD-10-CM | POA: Diagnosis present

## 2014-04-14 DIAGNOSIS — I9589 Other hypotension: Secondary | ICD-10-CM | POA: Diagnosis not present

## 2014-04-14 DIAGNOSIS — J9601 Acute respiratory failure with hypoxia: Secondary | ICD-10-CM | POA: Diagnosis present

## 2014-04-14 LAB — CARBOXYHEMOGLOBIN
Carboxyhemoglobin: 1.6 % — ABNORMAL HIGH (ref 0.5–1.5)
Carboxyhemoglobin: 1.5 % (ref 0.5–1.5)
Carboxyhemoglobin: 1.7 % — ABNORMAL HIGH (ref 0.5–1.5)
METHEMOGLOBIN: 0.9 % (ref 0.0–1.5)
Methemoglobin: 0.7 % (ref 0.0–1.5)
Methemoglobin: 0.9 % (ref 0.0–1.5)
O2 SAT: 92.4 %
O2 SAT: 70.8 %
O2 Saturation: 94.4 %
TOTAL HEMOGLOBIN: 8.8 g/dL — AB (ref 13.5–18.0)
TOTAL HEMOGLOBIN: 9.5 g/dL — AB (ref 13.5–18.0)
Total hemoglobin: 8.1 g/dL — ABNORMAL LOW (ref 13.5–18.0)

## 2014-04-14 LAB — POCT I-STAT 3, ART BLOOD GAS (G3+)
ACID-BASE DEFICIT: 18 mmol/L — AB (ref 0.0–2.0)
ACID-BASE DEFICIT: 9 mmol/L — AB (ref 0.0–2.0)
ACID-BASE DEFICIT: 1 mmol/L (ref 0.0–2.0)
ACID-BASE DEFICIT: 1 mmol/L (ref 0.0–2.0)
Acid-base deficit: 13 mmol/L — ABNORMAL HIGH (ref 0.0–2.0)
Acid-base deficit: 1 mmol/L (ref 0.0–2.0)
BICARBONATE: 24.5 meq/L — AB (ref 20.0–24.0)
Bicarbonate: 11.9 mEq/L — ABNORMAL LOW (ref 20.0–24.0)
Bicarbonate: 15.3 mEq/L — ABNORMAL LOW (ref 20.0–24.0)
Bicarbonate: 17.3 mEq/L — ABNORMAL LOW (ref 20.0–24.0)
Bicarbonate: 23.9 mEq/L (ref 20.0–24.0)
Bicarbonate: 24.3 mEq/L — ABNORMAL HIGH (ref 20.0–24.0)
O2 SAT: 95 %
O2 SAT: 92 %
O2 SAT: 94 %
O2 Saturation: 95 %
O2 Saturation: 99 %
O2 Saturation: 91 %
PCO2 ART: 42 mmHg (ref 35.0–45.0)
PCO2 ART: 35.9 mmHg (ref 35.0–45.0)
PH ART: 7.381 (ref 7.350–7.450)
PO2 ART: 74 mmHg — AB (ref 80.0–100.0)
Patient temperature: 95.4
TCO2: 13 mmol/L (ref 0–100)
TCO2: 17 mmol/L (ref 0–100)
TCO2: 18 mmol/L (ref 0–100)
TCO2: 25 mmol/L (ref 0–100)
TCO2: 26 mmol/L (ref 0–100)
TCO2: 26 mmol/L (ref 0–100)
pCO2 arterial: 38.8 mmHg (ref 35.0–45.0)
pCO2 arterial: 39.9 mmHg (ref 35.0–45.0)
pCO2 arterial: 40.8 mmHg (ref 35.0–45.0)
pCO2 arterial: 41.7 mmHg (ref 35.0–45.0)
pH, Arterial: 7.061 — CL (ref 7.350–7.450)
pH, Arterial: 7.193 — CL (ref 7.350–7.450)
pH, Arterial: 7.282 — ABNORMAL LOW (ref 7.350–7.450)
pH, Arterial: 7.38 (ref 7.350–7.450)
pH, Arterial: 7.377 (ref 7.350–7.450)
pO2, Arterial: 104 mmHg — ABNORMAL HIGH (ref 80.0–100.0)
pO2, Arterial: 86 mmHg (ref 80.0–100.0)
pO2, Arterial: 124 mmHg — ABNORMAL HIGH (ref 80.0–100.0)
pO2, Arterial: 59 mmHg — ABNORMAL LOW (ref 80.0–100.0)
pO2, Arterial: 63 mmHg — ABNORMAL LOW (ref 80.0–100.0)

## 2014-04-14 LAB — I-STAT CG4 LACTIC ACID, ED: Lactic Acid, Venous: 7.23 mmol/L (ref 0.5–2.0)

## 2014-04-14 LAB — COMPREHENSIVE METABOLIC PANEL
ALBUMIN: 3.1 g/dL — AB (ref 3.5–5.2)
ALT: 20 U/L (ref 0–53)
AST: 26 U/L (ref 0–37)
Alkaline Phosphatase: 69 U/L (ref 39–117)
Anion gap: 12 (ref 5–15)
BILIRUBIN TOTAL: 0.6 mg/dL (ref 0.3–1.2)
BUN: 89 mg/dL — AB (ref 6–23)
CO2: 20 mmol/L (ref 19–32)
CREATININE: 5.66 mg/dL — AB (ref 0.50–1.35)
Calcium: 8.6 mg/dL (ref 8.4–10.5)
Chloride: 102 mmol/L (ref 96–112)
GFR, EST AFRICAN AMERICAN: 11 mL/min — AB (ref >90)
GFR, EST NON AFRICAN AMERICAN: 10 mL/min — AB (ref >90)
Glucose, Bld: 196 mg/dL — ABNORMAL HIGH (ref 70–99)
POTASSIUM: 6.8 mmol/L — AB (ref 3.5–5.1)
SODIUM: 134 mmol/L — AB (ref 135–145)
TOTAL PROTEIN: 5.8 g/dL — AB (ref 6.0–8.3)

## 2014-04-14 LAB — LIPASE, BLOOD: Lipase: 33 U/L (ref 11–59)

## 2014-04-14 LAB — TROPONIN I
Troponin I: <0.03 ng/mL (ref <0.031)
Troponin I: 0.03 ng/mL (ref <0.031)
Troponin I: 0.22 ng/mL — ABNORMAL HIGH (ref <0.031)

## 2014-04-14 LAB — GLUCOSE, CAPILLARY
GLUCOSE-CAPILLARY: 248 mg/dL — AB (ref 70–99)
GLUCOSE-CAPILLARY: 273 mg/dL — AB (ref 70–99)
GLUCOSE-CAPILLARY: 95 mg/dL (ref 70–99)
GLUCOSE-CAPILLARY: 102 mg/dL — AB (ref 70–99)
GLUCOSE-CAPILLARY: 130 mg/dL — AB (ref 70–99)
GLUCOSE-CAPILLARY: 132 mg/dL — AB (ref 70–99)
Glucose-Capillary: 100 mg/dL — ABNORMAL HIGH (ref 70–99)
Glucose-Capillary: 117 mg/dL — ABNORMAL HIGH (ref 70–99)
Glucose-Capillary: 119 mg/dL — ABNORMAL HIGH (ref 70–99)
Glucose-Capillary: 142 mg/dL — ABNORMAL HIGH (ref 70–99)
Glucose-Capillary: 159 mg/dL — ABNORMAL HIGH (ref 70–99)
Glucose-Capillary: 216 mg/dL — ABNORMAL HIGH (ref 70–99)
Glucose-Capillary: 277 mg/dL — ABNORMAL HIGH (ref 70–99)
Glucose-Capillary: 290 mg/dL — ABNORMAL HIGH (ref 70–99)
Glucose-Capillary: 311 mg/dL — ABNORMAL HIGH (ref 70–99)

## 2014-04-14 LAB — RENAL FUNCTION PANEL
Albumin: 3 g/dL — ABNORMAL LOW (ref 3.5–5.2)
Albumin: 3.1 g/dL — ABNORMAL LOW (ref 3.5–5.2)
Anion gap: 24 — ABNORMAL HIGH (ref 5–15)
Anion gap: 20 — ABNORMAL HIGH (ref 5–15)
BUN: 74 mg/dL — AB (ref 6–23)
BUN: 61 mg/dL — ABNORMAL HIGH (ref 6–23)
CALCIUM: 8 mg/dL — AB (ref 8.4–10.5)
CO2: 13 mmol/L — ABNORMAL LOW (ref 19–32)
CO2: 21 mmol/L (ref 19–32)
Calcium: 8.5 mg/dL (ref 8.4–10.5)
Chloride: 101 mmol/L (ref 96–112)
Chloride: 94 mmol/L — ABNORMAL LOW (ref 96–112)
Creatinine, Ser: 5.1 mg/dL — ABNORMAL HIGH (ref 0.50–1.35)
Creatinine, Ser: 4.21 mg/dL — ABNORMAL HIGH (ref 0.50–1.35)
GFR calc Af Amer: 16 mL/min — ABNORMAL LOW (ref >90)
GFR calc non Af Amer: 11 mL/min — ABNORMAL LOW (ref >90)
GFR, EST AFRICAN AMERICAN: 13 mL/min — AB (ref >90)
GFR, EST NON AFRICAN AMERICAN: 14 mL/min — AB (ref >90)
Glucose, Bld: 307 mg/dL — ABNORMAL HIGH (ref 70–99)
Glucose, Bld: 98 mg/dL (ref 70–99)
PHOSPHORUS: 6.8 mg/dL — AB (ref 2.3–4.6)
PHOSPHORUS: 4.9 mg/dL — AB (ref 2.3–4.6)
Potassium: 4.9 mmol/L (ref 3.5–5.1)
Potassium: 5.6 mmol/L — ABNORMAL HIGH (ref 3.5–5.1)
SODIUM: 138 mmol/L (ref 135–145)
SODIUM: 135 mmol/L (ref 135–145)

## 2014-04-14 LAB — CBC
HCT: 33.8 % — ABNORMAL LOW (ref 39.0–52.0)
Hemoglobin: 9 g/dL — ABNORMAL LOW (ref 13.0–17.0)
MCH: 25.2 pg — AB (ref 26.0–34.0)
MCHC: 26.6 g/dL — ABNORMAL LOW (ref 30.0–36.0)
MCV: 94.7 fL (ref 78.0–100.0)
PLATELETS: 461 10*3/uL — AB (ref 150–400)
RBC: 3.57 MIL/uL — ABNORMAL LOW (ref 4.22–5.81)
RDW: 24.7 % — ABNORMAL HIGH (ref 11.5–15.5)
WBC: 11.8 10*3/uL — ABNORMAL HIGH (ref 4.0–10.5)

## 2014-04-14 LAB — PROTIME-INR
INR: 6.73 (ref 0.00–1.49)
INR: 3.08 — AB (ref 0.00–1.49)
PROTHROMBIN TIME: 59 s — AB (ref 11.6–15.2)
PROTHROMBIN TIME: 32 s — AB (ref 11.6–15.2)

## 2014-04-14 LAB — PROCALCITONIN: PROCALCITONIN: 0.11 ng/mL

## 2014-04-14 LAB — TYPE AND SCREEN
ABO/RH(D): O POS
Antibody Screen: NEGATIVE

## 2014-04-14 LAB — ABO/RH: ABO/RH(D): O POS

## 2014-04-14 LAB — MAGNESIUM: Magnesium: 2 mg/dL (ref 1.5–2.5)

## 2014-04-14 LAB — LACTIC ACID, PLASMA
LACTIC ACID, VENOUS: 11.6 mmol/L — AB (ref 0.5–2.0)
LACTIC ACID, VENOUS: 10.1 mmol/L — AB (ref 0.5–2.0)
Lactic Acid, Venous: 9.6 mmol/L (ref 0.5–2.0)

## 2014-04-14 LAB — TSH: TSH: 3.662 u[IU]/mL (ref 0.350–4.500)

## 2014-04-14 LAB — BASIC METABOLIC PANEL
Anion gap: 17 — ABNORMAL HIGH (ref 5–15)
BUN: 85 mg/dL — ABNORMAL HIGH (ref 6–23)
CHLORIDE: 103 mmol/L (ref 96–112)
CO2: 18 mmol/L — ABNORMAL LOW (ref 19–32)
Calcium: 8.8 mg/dL (ref 8.4–10.5)
Creatinine, Ser: 5.62 mg/dL — ABNORMAL HIGH (ref 0.50–1.35)
GFR calc non Af Amer: 10 mL/min — ABNORMAL LOW (ref >90)
GFR, EST AFRICAN AMERICAN: 11 mL/min — AB (ref >90)
Glucose, Bld: 255 mg/dL — ABNORMAL HIGH (ref 70–99)
Potassium: 5.3 mmol/L — ABNORMAL HIGH (ref 3.5–5.1)
SODIUM: 138 mmol/L (ref 135–145)

## 2014-04-14 LAB — AMYLASE: Amylase: 57 U/L (ref 0–105)

## 2014-04-14 LAB — CREATININE, SERUM
Creatinine, Ser: 5.64 mg/dL — ABNORMAL HIGH (ref 0.50–1.35)
GFR, EST AFRICAN AMERICAN: 11 mL/min — AB (ref >90)
GFR, EST NON AFRICAN AMERICAN: 10 mL/min — AB (ref >90)

## 2014-04-14 LAB — APTT: aPTT: 71 seconds — ABNORMAL HIGH (ref 24–37)

## 2014-04-14 LAB — CORTISOL: CORTISOL PLASMA: 35.5 ug/dL

## 2014-04-14 LAB — MRSA PCR SCREENING: MRSA BY PCR: NEGATIVE

## 2014-04-14 LAB — BRAIN NATRIURETIC PEPTIDE: B Natriuretic Peptide: 333.2 pg/mL — ABNORMAL HIGH (ref 0.0–100.0)

## 2014-04-14 MED ORDER — DEXTROSE 5 % IV SOLN
2.0000 g | Freq: Once | INTRAVENOUS | Status: AC
  Administered 2014-04-14: 2 g via INTRAVENOUS
  Filled 2014-04-14 (×2): qty 2

## 2014-04-14 MED ORDER — INSULIN GLARGINE 100 UNIT/ML ~~LOC~~ SOLN
10.0000 [IU] | SUBCUTANEOUS | Status: DC
  Administered 2014-04-14 – 2014-04-16 (×3): 10 [IU] via SUBCUTANEOUS
  Filled 2014-04-14 (×4): qty 0.1

## 2014-04-14 MED ORDER — SODIUM CHLORIDE 0.9 % IV SOLN
25.0000 ug/h | INTRAVENOUS | Status: DC
  Administered 2014-04-14: 50 ug/h via INTRAVENOUS
  Administered 2014-04-15: 75 ug/h via INTRAVENOUS
  Administered 2014-04-16: 150 ug/h via INTRAVENOUS
  Filled 2014-04-14 (×3): qty 50

## 2014-04-14 MED ORDER — SODIUM CHLORIDE 0.9 % FOR CRRT
INTRAVENOUS_CENTRAL | Status: DC | PRN
  Filled 2014-04-14: qty 1000

## 2014-04-14 MED ORDER — SODIUM CHLORIDE 0.9 % IV SOLN
INTRAVENOUS | Status: DC
  Administered 2014-04-14: 10 mL/h via INTRAVENOUS

## 2014-04-14 MED ORDER — MIDAZOLAM HCL 2 MG/2ML IJ SOLN
INTRAMUSCULAR | Status: AC
  Administered 2014-04-14: 2 mg via INTRAVENOUS
  Filled 2014-04-14: qty 2

## 2014-04-14 MED ORDER — STERILE WATER FOR INJECTION IV SOLN
INTRAVENOUS | Status: DC
  Administered 2014-04-14 – 2014-04-15 (×7): via INTRAVENOUS_CENTRAL
  Filled 2014-04-14 (×16): qty 150

## 2014-04-14 MED ORDER — FENTANYL BOLUS VIA INFUSION
50.0000 ug | INTRAVENOUS | Status: DC | PRN
  Administered 2014-04-16 (×2): 50 ug via INTRAVENOUS
  Filled 2014-04-14 (×2): qty 50

## 2014-04-14 MED ORDER — FENTANYL CITRATE 0.05 MG/ML IJ SOLN
INTRAMUSCULAR | Status: AC
  Administered 2014-04-14: 01:00:00
  Filled 2014-04-14: qty 2

## 2014-04-14 MED ORDER — VANCOMYCIN HCL 10 G IV SOLR
1250.0000 mg | INTRAVENOUS | Status: DC
  Administered 2014-04-15 – 2014-04-16 (×2): 1250 mg via INTRAVENOUS
  Filled 2014-04-14 (×2): qty 1250

## 2014-04-14 MED ORDER — HEPARIN (PORCINE) 2000 UNITS/L FOR CRRT
INTRAVENOUS_CENTRAL | Status: DC | PRN
  Filled 2014-04-14: qty 1000

## 2014-04-14 MED ORDER — PRISMASOL BGK 0/2.5 32-2.5 MEQ/L IV SOLN
INTRAVENOUS | Status: DC
  Administered 2014-04-14 (×5): via INTRAVENOUS_CENTRAL
  Filled 2014-04-14 (×14): qty 5000

## 2014-04-14 MED ORDER — INSULIN ASPART 100 UNIT/ML ~~LOC~~ SOLN
1.0000 [IU] | SUBCUTANEOUS | Status: DC
  Administered 2014-04-14: 2 [IU] via SUBCUTANEOUS
  Administered 2014-04-14 – 2014-04-15 (×2): 1 [IU] via SUBCUTANEOUS
  Administered 2014-04-15: 2 [IU] via SUBCUTANEOUS
  Administered 2014-04-15 (×3): 1 [IU] via SUBCUTANEOUS
  Administered 2014-04-16 (×2): 2 [IU] via SUBCUTANEOUS
  Administered 2014-04-16: 3 [IU] via SUBCUTANEOUS
  Administered 2014-04-16 – 2014-04-17 (×3): 2 [IU] via SUBCUTANEOUS
  Administered 2014-04-17: 1 [IU] via SUBCUTANEOUS

## 2014-04-14 MED ORDER — SODIUM CHLORIDE 0.9 % IV SOLN
2500.0000 mg | Freq: Once | INTRAVENOUS | Status: AC
  Administered 2014-04-14: 2500 mg via INTRAVENOUS
  Filled 2014-04-14: qty 2500

## 2014-04-14 MED ORDER — STERILE WATER FOR INJECTION IV SOLN
INTRAVENOUS | Status: DC
  Administered 2014-04-14 (×3): via INTRAVENOUS_CENTRAL
  Filled 2014-04-14 (×12): qty 150

## 2014-04-14 MED ORDER — SODIUM CHLORIDE 0.9 % IV SOLN
Freq: Once | INTRAVENOUS | Status: AC
  Administered 2014-04-14: 10 mL/h via INTRAVENOUS

## 2014-04-14 MED ORDER — MIDAZOLAM HCL 2 MG/2ML IJ SOLN
2.0000 mg | Freq: Once | INTRAMUSCULAR | Status: AC
  Administered 2014-04-14: 2 mg via INTRAVENOUS

## 2014-04-14 MED ORDER — MILRINONE IN DEXTROSE 20 MG/100ML IV SOLN
0.2500 ug/kg/min | INTRAVENOUS | Status: DC
  Filled 2014-04-14: qty 100

## 2014-04-14 MED ORDER — VASOPRESSIN 20 UNIT/ML IV SOLN
0.0300 [IU]/min | INTRAVENOUS | Status: DC
  Administered 2014-04-14 – 2014-04-15 (×2): 0.03 [IU]/min via INTRAVENOUS
  Filled 2014-04-14 (×2): qty 2

## 2014-04-14 MED ORDER — STERILE WATER FOR INJECTION IV SOLN
INTRAVENOUS | Status: DC
  Filled 2014-04-14 (×4): qty 850

## 2014-04-14 MED ORDER — HEPARIN SODIUM (PORCINE) 1000 UNIT/ML DIALYSIS
1000.0000 [IU] | INTRAMUSCULAR | Status: DC | PRN
  Filled 2014-04-14: qty 6

## 2014-04-14 MED ORDER — INSULIN REGULAR HUMAN 100 UNIT/ML IJ SOLN
INTRAMUSCULAR | Status: DC
  Administered 2014-04-14: 09:00:00 via INTRAVENOUS
  Filled 2014-04-14: qty 2.5

## 2014-04-14 MED ORDER — FENTANYL CITRATE 0.05 MG/ML IJ SOLN
50.0000 ug | Freq: Once | INTRAMUSCULAR | Status: AC
  Administered 2014-04-14: 50 ug via INTRAVENOUS

## 2014-04-14 MED ORDER — CHLORHEXIDINE GLUCONATE 0.12 % MT SOLN
15.0000 mL | Freq: Two times a day (BID) | OROMUCOSAL | Status: DC
  Administered 2014-04-14 – 2014-04-16 (×5): 15 mL via OROMUCOSAL
  Filled 2014-04-14 (×5): qty 15

## 2014-04-14 MED ORDER — PRISMASOL BGK 0/2.5 32-2.5 MEQ/L IV SOLN
INTRAVENOUS | Status: DC
  Administered 2014-04-14 – 2014-04-15 (×6): via INTRAVENOUS_CENTRAL
  Filled 2014-04-14 (×16): qty 5000

## 2014-04-14 MED ORDER — MIDAZOLAM HCL 2 MG/2ML IJ SOLN
4.0000 mg | Freq: Once | INTRAMUSCULAR | Status: AC
  Administered 2014-04-14: 2 mg via INTRAVENOUS

## 2014-04-14 MED ORDER — SODIUM CHLORIDE 0.9 % IV SOLN: Freq: Once | INTRAVENOUS | Status: DC

## 2014-04-14 MED ORDER — DEXTROSE 5 % IV SOLN
0.0000 ug/min | INTRAVENOUS | Status: DC
  Administered 2014-04-14: 20 ug/min via INTRAVENOUS
  Administered 2014-04-16: 4 ug/min via INTRAVENOUS
  Filled 2014-04-14 (×2): qty 16

## 2014-04-14 MED ORDER — MILRINONE IN DEXTROSE 20 MG/100ML IV SOLN
0.2500 ug/kg/min | INTRAVENOUS | Status: DC
  Administered 2014-04-14: 0.25 ug/kg/min via INTRAVENOUS
  Filled 2014-04-14 (×2): qty 100

## 2014-04-14 MED ORDER — HYDROCORTISONE NA SUCCINATE PF 100 MG IJ SOLR
50.0000 mg | Freq: Four times a day (QID) | INTRAMUSCULAR | Status: DC
  Administered 2014-04-14 – 2014-04-16 (×9): 50 mg via INTRAVENOUS
  Filled 2014-04-14 (×13): qty 1

## 2014-04-14 MED ORDER — DEXTROSE 5 % IV SOLN
0.5000 ug/min | INTRAVENOUS | Status: DC
  Administered 2014-04-14: 12 ug/min via INTRAVENOUS
  Filled 2014-04-14 (×2): qty 4

## 2014-04-14 MED ORDER — ALTEPLASE 2 MG IJ SOLR
2.0000 mg | Freq: Once | INTRAMUSCULAR | Status: AC | PRN
  Filled 2014-04-14: qty 2

## 2014-04-14 MED ORDER — INSULIN ASPART 100 UNIT/ML ~~LOC~~ SOLN
0.0000 [IU] | SUBCUTANEOUS | Status: DC
  Administered 2014-04-14: 5 [IU] via SUBCUTANEOUS

## 2014-04-14 MED ORDER — STERILE WATER FOR INJECTION IV SOLN
INTRAVENOUS | Status: DC
  Administered 2014-04-14 (×7): via INTRAVENOUS_CENTRAL
  Filled 2014-04-14 (×13): qty 150

## 2014-04-14 MED ORDER — DEXTROSE 5 % IV SOLN
2.0000 g | Freq: Two times a day (BID) | INTRAVENOUS | Status: DC
  Administered 2014-04-14 – 2014-04-17 (×6): 2 g via INTRAVENOUS
  Filled 2014-04-14 (×7): qty 2

## 2014-04-14 MED ORDER — FUROSEMIDE 10 MG/ML IJ SOLN
8.0000 mg/h | INTRAVENOUS | Status: DC
  Administered 2014-04-14: 8 mg/h via INTRAVENOUS
  Filled 2014-04-14: qty 25

## 2014-04-14 MED ORDER — CETYLPYRIDINIUM CHLORIDE 0.05 % MT LIQD
7.0000 mL | Freq: Four times a day (QID) | OROMUCOSAL | Status: DC
  Administered 2014-04-14 – 2014-04-16 (×8): 7 mL via OROMUCOSAL

## 2014-04-14 MED ORDER — PERFLUTREN LIPID MICROSPHERE
INTRAVENOUS | Status: AC
  Administered 2014-04-14: 2 mL
  Filled 2014-04-14: qty 10

## 2014-04-14 MED ORDER — PRISMASOL BGK 0/2.5 32-2.5 MEQ/L IV SOLN
INTRAVENOUS | Status: DC
  Administered 2014-04-14 – 2014-04-17 (×5): via INTRAVENOUS_CENTRAL
  Filled 2014-04-14 (×13): qty 5000

## 2014-04-14 MED ORDER — MIDAZOLAM HCL 2 MG/2ML IJ SOLN
INTRAMUSCULAR | Status: AC
  Administered 2014-04-14: 2 mg
  Filled 2014-04-14: qty 4

## 2014-04-14 MED ORDER — VITAMIN K1 10 MG/ML IJ SOLN
10.0000 mg | Freq: Once | INTRAVENOUS | Status: AC
  Administered 2014-04-14: 10 mg via INTRAVENOUS
  Filled 2014-04-14: qty 1

## 2014-04-14 MED ORDER — LIDOCAINE HCL (PF) 1 % IJ SOLN
INTRAMUSCULAR | Status: AC
  Administered 2014-04-14: 04:00:00
  Filled 2014-04-14: qty 5

## 2014-04-14 MED ORDER — MILRINONE IN DEXTROSE 20 MG/100ML IV SOLN
0.2500 ug/kg/min | INTRAVENOUS | Status: DC
  Administered 2014-04-14: 0.25 ug/kg/min via INTRAVENOUS

## 2014-04-14 MED ORDER — FAMOTIDINE IN NACL 20-0.9 MG/50ML-% IV SOLN
20.0000 mg | Freq: Two times a day (BID) | INTRAVENOUS | Status: DC
  Administered 2014-04-14 – 2014-04-15 (×4): 20 mg via INTRAVENOUS
  Filled 2014-04-14 (×5): qty 50

## 2014-04-14 MED ORDER — DOPAMINE-DEXTROSE 3.2-5 MG/ML-% IV SOLN
0.0000 ug/kg/min | INTRAVENOUS | Status: DC
  Administered 2014-04-14: 10 ug/kg/min via INTRAVENOUS
  Filled 2014-04-14 (×2): qty 250

## 2014-04-14 MED ORDER — DOPAMINE-DEXTROSE 3.2-5 MG/ML-% IV SOLN
0.0000 ug/kg/min | INTRAVENOUS | Status: DC
  Administered 2014-04-14 (×2): 20 ug/kg/min via INTRAVENOUS
  Administered 2014-04-15: 5 ug/kg/min via INTRAVENOUS
  Filled 2014-04-14 (×2): qty 250

## 2014-04-14 MED ORDER — HEPARIN SODIUM (PORCINE) 5000 UNIT/ML IJ SOLN: 5000.0000 [IU] | Freq: Three times a day (TID) | INTRAMUSCULAR | Status: DC

## 2014-04-14 MED ORDER — SODIUM CHLORIDE 0.9 % IV SOLN: 250.0000 mL | INTRAVENOUS | Status: DC | PRN

## 2014-04-14 NOTE — Progress Notes (Signed)
  Echocardiogram 2D Echocardiogram has been performed.  William Flores 04/14/2014, 11:34 AM

## 2014-04-14 NOTE — Procedures (Signed)
Intubation Procedure Note William Flores [id number] [birth year]  Procedure: Intubation Indications: Respiratory insufficiency  Procedure Details Consent: Unable to obtain consent because of emergent medical necessity. Time Out: Verified patient identification, verified procedure, site/side was marked, verified correct patient position, special equipment/implants available, medications/allergies/relevent history reviewed, required imaging and test results available.  Performed  Drugs versed 69m, Ketamine 200 mg DL x 1 with GS blade Grade 1 view 8 tube passed through cords under direct visualization, some difficulty passing through subglottic space Placement confirmed with bilateral breath sounds, positive EtCO2 change and smoke in tube   Evaluation Hemodynamic Status: BP stable throughout; O2 sats: stable throughout Patient's Current Condition: stable Complications: No apparent complications Patient did tolerate procedure well. Chest X-ray ordered to verify placement.  CXR: tube position acceptable.   MSimonne Maffucci4/11/2014

## 2014-04-14 NOTE — Progress Notes (Signed)
PULMONARY / CRITICAL CARE MEDICINE   Name: William Flores MRN: [id number] DOB: [birth year]    ADMISSION DATE:  04/13/2014 CONSULTATION DATE:  04/13/14  REFERRING MD :  Ashok Cordia  CHIEF COMPLAINT:  Shortness of breath  INITIAL PRESENTATION: 64 y/o male came to the [facility] ED on 4/10 by EMS for "low oxygen and trouble breathing".  He was found to be in cardiogenic shock, bradycardic, in acute renal failure, hyperkalemic, and with lactic acidosis.   STUDIES:    SIGNIFICANT EVENTS: 4/10 admission, intubation 4/11 started on CRRT   SUBJECTIVE: worsening lactic acidosis overnight, started on CRRT, minimal UOP  VITAL SIGNS: Temp:  [93.2 F (34 C)-95.8 F (35.4 C)] 95.2 F (35.1 C) (04/11 0530) Pulse Rate:  [35-101] 101 (04/11 0530) Resp:  [0-30] 0 (04/11 0530) BP: (72-104)/(26-73) 88/44 mmHg (04/11 0300) SpO2:  [78 %-100 %] 100 % (04/11 0530) FiO2 (%):  [100 %] 100 % (04/11 0455) Weight:  [121.6 kg (268 lb 1.3 oz)-135.4 kg (298 lb 8.1 oz)] 135.4 kg (298 lb 8.1 oz) (04/11 0300) HEMODYNAMICS: CVP:  [18 mmHg] 18 mmHg VENTILATOR SETTINGS: Vent Mode:  [-] PRVC FiO2 (%):  [100 %] 100 % Set Rate:  [15 bmp-30 bmp] 30 bmp Vt Set:  [500 mL] 500 mL PEEP:  [8 cmH20-10 cmH20] 10 cmH20 Plateau Pressure:  [27 LZJ67-34 cmH20] 27 cmH20 INTAKE / OUTPUT:  Intake/Output Summary (Last 24 hours) at 04/14/14 0710 Last data filed at 04/14/14 0500  Gross per 24 hour  Intake 818.37 ml  Output      0 ml  Net 818.37 ml    PHYSICAL EXAMINATION: General:  Obese, sedated on vent Neuro:  Follows commands intermittently HEENT:  NCAT, EOMi, ETT in place Cardiovascular:  Tachy, distant heart sounds Lungs:  Few crackles in bases, no wheezing Abdomen:  Infrequent bowel sounds, soft, non-tender Musculoskeletal:  Decreased bulk and tone Skin:  warm, acyanotic, dry  LABS:  CBC  Recent Labs Lab 04/13/14 2140 04/13/14 2152 04/13/14 2323 04/14/14 0221  WBC 12.5*  --   --  11.8*  HGB 9.8* 12.9* 10.5*  9.0*  HCT 36.6* 38.0* 31.0* 33.8*  PLT 471*  --   --  461*   Coag's  Recent Labs Lab 04/13/14 2140  INR 5.96*   BMET  Recent Labs Lab 04/13/14 2140 04/13/14 2152 04/13/14 2323 04/14/14 0221 04/14/14 0225  NA 134* 134* 135  --  138  K 6.8* 6.6* 5.6*  --  5.3*  CL 102 105 109  --  103  CO2 20  --   --   --  18*  BUN 89* 99* 90*  --  85*  CREATININE 5.66* 5.80* 5.30* 5.64* 5.62*  GLUCOSE 196* 192* 217*  --  255*   Electrolytes  Recent Labs Lab 04/13/14 2140 04/14/14 0225  CALCIUM 8.6 8.8   Sepsis Markers  Recent Labs Lab 04/13/14 2151 04/14/14 0051 04/14/14 0213 04/14/14 0221  LATICACIDVEN 5.68* 7.23* 9.6*  --   PROCALCITON  --   --   --  0.11   ABG  Recent Labs Lab 04/13/14 2212 04/13/14 2322 04/14/14 0430  PHART 7.064* 7.077* 7.061*  PCO2ART 53.7* 51.3* 42.0  PO2ART 99.0 118.0* 104.0*   Liver Enzymes  Recent Labs Lab 04/13/14 2140  AST 26  ALT 20  ALKPHOS 69  BILITOT 0.6  ALBUMIN 3.1*   Cardiac Enzymes  Recent Labs Lab 04/14/14 0221  TROPONINI <0.03   Glucose  Recent Labs Lab 04/14/14 0251  GLUCAP 248*  Imaging Dg Chest Portable 1 View  04/13/2014   CLINICAL DATA:  Shortness of breath and bradycardia  EXAM: PORTABLE CHEST - 1 VIEW  COMPARISON:  02/20/2014  FINDINGS: Chronic cardiomegaly, with size accentuated by increase in lower mediastinal fat on recent CT. There is widening of the vascular pedicle which is also also stable.  Haziness of the lower chest compatible with pleural effusions and atelectasis. There is pulmonary venous congestion and probable basilar alveolar edema.  IMPRESSION: CHF pattern.   Electronically Signed   By: Monte Fantasia M.D.   On: 04/13/2014 22:15     ASSESSMENT / PLAN:  PULMONARY OETT 4/11 >> A: Acute respiratory failure due to acute pulmonary edema, doubt pneumonia P:   -see ID -see cardiology -full vent support -high PEEP strategy given morbid obesity, high transpulmonary  pressure -volume removal as able with CVVHD  CARDIOVASCULAR CVL L IJ 4/11 >> A:  Acute cardiogenic shock, worsening, at this point doubt septic shock, elevated Coox noted Acute systolic heart failure > echo pending Bradycardia due to metabolic acidosis > resolved Chronic atrial fibriliation P:  -appreciate cardiology consult -f/u echo -hold home BP medications (metoprolol, lisinopril) -wean off epi gtt -milrinone and dopamine > may need help from advanced heart failure team -titrate dopamine, vasopressin to MAP > 65 -follow coox -serial lactic acid -lasix gtt > could likely stop today, will f/u cardiology recs  RENAL A:  Acute kidney injury, presumably due to lack of forward flow, though blood and protein noted on U/A Metabolic acidosis> lactic acidosis due to shock Hyperkalemia P:   -appreciate renal -continue volume removal -follow BMET  GASTROINTESTINAL A:  Diarrhea > resolved Gut ischemia? Rising lactate P:   -KUB> look for perforation -monitor stool output -OG tube -tube feedings for nutrition -pepcid for SUP  HEMATOLOGIC A:  Coagulopathy due to warfarin P:  -monitor INR  -monitor for bleeding -hold warfarin  INFECTIOUS A:  As noted previously, no clear source of infection P:   BCx2 4/11 > Sputum 4/11 > Abx: vanc, start date 4/11 > Abx Zosyn, start date 4/11 >  ENDOCRINE A:  DM2 P:   -SSI q4  NEUROLOGIC A:  Acute encephalopathy due to hypoxemia, heart failure, hypercapnea Vent synchrony needs P:   RASS goal: -1 Fentanyl gtt   FAMILY  - Updates: Son [name] updated by me on phone on 4/10.  I clarified the severity of the illness. He noted that he does not have a great relationship with his dad.  States that he will come "in 2-3 days"  My cc time 45 minutes  Roselie Awkward, MD [place] [facility] Pager: [phone] Cell: [phone] If no response, call [phone]   04/14/2014, 7:10 AM

## 2014-04-14 NOTE — Consult Note (Signed)
Renal Service Consult Note [facility]  William Flores 04/14/2014 [place] Requesting Physician:  Dr Lake Bells  Reason for Consult:  Acute renal failure in patient w shock, lactic acidosis, pulm edema HPI: The patient is a 64 y.o. year-old with hx of MO, OSA/OHS, syst/diast CHF, chronic afib.  Recurrent admissions for syst and/or diast HF over the last 8 months. Last admit in Feb required pressors and intubation, ICU stay. Now presenting w SOB and weakness for 3 weeks, some diarrhea.  No CP.  EMS found BP to be in the 50's. In ED was found to have hyperkalemia, AKI and lactic acidosis. Admit by CCM, cardiogenic vs septic shock, no clear source of sepsis however. On empiric abx. Hyperkalemia treated with insulin, glucose and IV Ca.  On epi drip now, plan is to transition to milrinone and dopamine infusions overnight, get CVP, coox and start lasix drip.       Chart review: 2002 - colonoscopy complicated by LGIB after polypectomy, resolved on its own 2008 - new atrial flutter, no past cardiac hx. Underwent heart cath w EF 25%, global hypokinesis, 50% PAD. Rx'd w medication. Coumadin for anticoagulation 08/2013 - NICM, hx afib , HTN, CAD, OSA, resection of colon Ca, DM2 > presented w SOB / pulm edema due to a/c diast CHF. ECHO showed EF 55-60%.  Rx w diuresis.  10/2013 - progressive SOB x 1 mo, cough and wt gain > dx was bronchitis w a/c diast HF. Rx'd with abx and IV lasix.  Resp distress resolved.  Creat 1.42 at discharge. HL on statin, MO, OSA on CPAP. Hypoxemia initially improved w RA sat 92%.  Po Zithromax. Diuresed 18 pounds w IV lasix and metolazone.  DC'd 2/8 - 02/26/14 > SOB , 10 lb wt gain, abd fullness > in ED CXR negative, a/c renal failure, BNP 200 > admitted, then decompensated on floor w hypercarbic resp failure, transferred to ICU and admitted.  Shock, treated with levophed, hep gtt and sedation.  ECHO showed EF 35-40% w diast HF.  Improved w IV lasix/ abx.  CT chest  no infiltrate. 7d abx for poss PNA nonetheless. Changed to po lasix. Resumed dig/toprol.  No CCB due to borderline hypotension, no amio d/t pulm disease.  Coumadin for CAF.  Acute on CRF 3, creat returned to normal. Cont ACEi and diuretics. DM2 not on insulin.   ROS  n/a  Past Medical History  Past Medical History  Diagnosis Date  . Diabetes   . Colon cancer   . Hypertension   . Sleep apnea   . Atrial fibrillation   . Hyperlipidemia   . CHF (congestive heart failure)   . Colon polyps 09/21/2012    Tubular adenoma   Past Surgical History  Past Surgical History  Procedure Laterality Date  . Colon resection    . Circumcision    . Colonoscopy N/A 09/21/2012    Procedure: COLONOSCOPY;  Surgeon: Inda Castle, MD;  Location: [facility];  Service: Endoscopy;  Laterality: N/A;  . US echocardiography  03/04/2010    abnormal study   Family History  Family History  Problem Relation Age of Onset  . Breast cancer Mother   . Diabetes Mother    Social History  reports that he quit smoking about 30 years ago. He has never used smokeless tobacco. He reports that he does not drink alcohol or use illicit drugs. Allergies  Allergies  Allergen Reactions  . Codeine     Headache  Home medications Prior to Admission medications   Medication Sig Start Date End Date Taking? Authorizing Provider  allopurinol (ZYLOPRIM) 300 MG tablet TAKE 1 TABLET (300 MG TOTAL) BY MOUTH DAILY. ALSO TAKE 100MG DAILY TO EQUAL 400MG TOTAL. 01/01/14   Wardell Honour, MD  aspirin 81 MG tablet Take 81 mg by mouth daily.    Historical Provider, MD  atorvastatin (LIPITOR) 20 MG tablet Take 1 tablet (20 mg total) by mouth daily at 6 PM. 02/26/14   Janece Canterbury, MD  azelastine (OPTIVAR) 0.05 % ophthalmic solution Place 2 drops into both eyes 2 (two) times daily. 04/19/13   Vernie Shanks, MD  benzonatate (TESSALON) 100 MG capsule Take 1 capsule (100 mg total) by mouth 2 (two) times daily. 04/04/14   Mary-Margaret  Hassell Done, FNP  ferrous sulfate 325 (65 FE) MG tablet Take 1 tablet (325 mg total) by mouth 2 (two) times daily with a meal. 02/26/14   Janece Canterbury, MD  furosemide (LASIX) 40 MG tablet Take 40 mg by mouth daily. 01/18/14   Historical Provider, MD  glucose blood test strip Use to check BG daily 10/07/13   Tammy Eckard, PHARMD  lisinopril (PRINIVIL,ZESTRIL) 5 MG tablet 1 tab daily 03/27/14   Liliane Shi, PA-C  metolazone (ZAROXOLYN) 2.5 MG tablet TAKE 1 TABLET (2.5 MG TOTAL) BY MOUTH DAILY. 03/24/14   Mary-Margaret Hassell Done, FNP  metoprolol succinate (TOPROL-XL) 50 MG 24 hr tablet Take 1 tablet (50 mg total) by mouth 2 (two) times daily. Take with or immediately following a meal. 02/26/14   Janece Canterbury, MD  omeprazole (PRILOSEC) 20 MG capsule TAKE 1 CAPSULE (20 MG TOTAL) BY MOUTH DAILY. 12/17/13   Lysbeth Penner, FNP  [facility] DELICA LANCETS 41S MISC 1 each by Does not apply route daily. Use one lancet to check BG daily 09/13/12   Tammy Eckard, PHARMD  sitaGLIPtin-metformin (JANUMET) 50-1000 MG per tablet Take 1 tablet by mouth 2 (two) times daily with a meal. 08/05/13   Lysbeth Penner, FNP  warfarin (COUMADIN) 10 MG tablet Take 1 tablet (10 mg total) by mouth daily at 6 PM. 02/26/14   Janece Canterbury, MD   Liver Function Tests  Recent Labs Lab 04/13/14 2140  AST 26  ALT 20  ALKPHOS 69  BILITOT 0.6  PROT 5.8*  ALBUMIN 3.1*   No results for input(s): LIPASE, AMYLASE in the last 168 hours. CBC  Recent Labs Lab 04/13/14 2140 04/13/14 2152 04/13/14 2323  WBC 12.5*  --   --   NEUTROABS 9.2*  --   --   HGB 9.8* 12.9* 10.5*  HCT 36.6* 38.0* 31.0*  MCV 93.6  --   --   PLT 471*  --   --    Basic Metabolic Panel  Recent Labs Lab 04/13/14 2140 04/13/14 2152 04/13/14 2323  NA 134* 134* 135  K 6.8* 6.6* 5.6*  CL 102 105 109  CO2 20  --   --   GLUCOSE 196* 192* 217*  BUN 89* 99* 90*  CREATININE 5.66* 5.80* 5.30*  CALCIUM 8.6  --   --     Filed Vitals:   04/14/14 0030 04/14/14  0044 04/14/14 0106 04/14/14 0106  BP: 97/65  83/43 83/42  Pulse: 75 80 75   Temp:      TempSrc:      Resp: 13 30 27    Height:   5' 4"  (1.626 m)   Weight:   121.6 kg (268 lb 1.3 oz)   SpO2:  98% 95% 100%    Exam Intubated, unresponsive No rash, cyanosis or gangrene Sclera anicteric, throat clear No JVD Chest clear bilat RRR no obvious murmur or gallop Abd morbidly obese, no BS noted, soft, no HSM; +supraumbilical area of ecchymosis Bilat groin rash Brawny chronic skin changes bilat LE's pretibial region, no sig edema Neuro is not responding at this time, pupils reactive  ABG 7.077 / 51/ 118 Na 134, K 6.8, CO2 20, BUN 89, Creat 5.66, Ca 8.6 Glu 196, alb 3.1, AST/ALT wnl, Tbili 0.6 BNP 467, trop 0.00, lactic acid 5.68 Hb 9.8, WBC 12k, plt 471 INR 5.96 CXR initial film CHF pattern, next film progressive bilat diffuse lung opacification c/w pulm edema  Assessment: 1. Acute kidney injury - suspect d/t shock 2. Severe combined resp/ metabolic acidosis 3. Shock , likely cardiogenic, less likely septic 4. Acute resp failure / bilat pulm edema 5. Hyperkalemia 6. Coagulopathy - INR 5.6 on coumadin  7. Morbid obesity 8. Bradycardia 9. Chronic afib 10. Hx systolic / diast HF   Plan - will need CRRT. See orders. Keep even for now.   Kelly Splinter MD (pgr) [phone]    [phone] 04/14/2014, 1:17 AM

## 2014-04-14 NOTE — Progress Notes (Signed)
Utilization review completed. Beulah Capobianco, RN, BSN. 

## 2014-04-14 NOTE — Progress Notes (Addendum)
Advanced Heart Failure Rounding Note   Subjective:   [name] is a 64 year old with history of chronic combined diastolic/systolic heart failure, DMII, OSA - CPAP, obesity, and chronic A-fib - was on coumadin.   Admitted with increased dyspnea and found to be hypotensive - SBP 50s and bradycardic- HR 40s. Lactic Acid was 5.6, creatinine 5.6, INR 5.96, and K 6.8. Received insulin, calcium and glucose. Due to bradycardia.  Given digi bind  -->10 vials. He received bicarb and IV fluids. He continued to decline and required intubation. CXR with pulmonary edema. Nephrology consulted and started on CVVHD earlier this am.   Now on lasix drip 8 mg per hour, dopa 20 mcg, epi 12 mcg, milrinone 0.25 mcg, and vasopressin 0.03 units.  Poor urine output persistent hypotension. SBP 60-70s.    Objective:   Weight Range:  Vital Signs:   Temp:  [93.2 F (34 C)-95.8 F (35.4 C)] 95.2 F (35.1 C) (04/11 0900) Pulse Rate:  [35-104] 99 (04/11 0920) Resp:  [0-31] 30 (04/11 0920) BP: (72-104)/(26-73) 78/35 mmHg (04/11 0920) SpO2:  [78 %-100 %] 100 % (04/11 0900) FiO2 (%):  [100 %] 100 % (04/11 0920) Weight:  [268 lb 1.3 oz (121.6 kg)-298 lb 8.1 oz (135.4 kg)] 298 lb 8.1 oz (135.4 kg) (04/11 0300) Last BM Date: 04/14/14  Weight change: Filed Weights   04/14/14 0106 04/14/14 0300  Weight: 268 lb 1.3 oz (121.6 kg) 298 lb 8.1 oz (135.4 kg)    Intake/Output:   Intake/Output Summary (Last 24 hours) at 04/14/14 0928 Last data filed at 04/14/14 0900  Gross per 24 hour  Intake 1444.57 ml  Output    648 ml  Net 796.57 ml     Physical Exam: CVP 16  General:: Intubated. No resp difficulty HEENT: normal Neck: supple. JVP difficult to assess due body habitus.  Carotids 2+ bilat; no bruits. No lymphadenopathy or thryomegaly appreciated. LIJ . R HD catheter.  Cor: PMI nondisplaced. Regular rate & rhythm. No rubs, gallops or murmurs. Lungs: clear Abdomen: obese, soft, nontender, distended. No  hepatosplenomegaly. No bruits or masses. Good bowel sounds. Extremities: no cyanosis, clubbing, rash,  1+  RLE and LLE. Bilaters SCDs.  Neuro: Intubated.  GU: Foley in place   Telemetry: Sinus Tach 90-100s   Labs: Basic Metabolic Panel:  Recent Labs Lab 04/13/14 2140 04/13/14 2152 04/13/14 2323 04/14/14 0221 04/14/14 0225 04/14/14 0708  NA 134* 134* 135  --  138  --   K 6.8* 6.6* 5.6*  --  5.3*  --   CL 102 105 109  --  103  --   CO2 20  --   --   --  18*  --   GLUCOSE 196* 192* 217*  --  255*  --   BUN 89* 99* 90*  --  85*  --   CREATININE 5.66* 5.80* 5.30* 5.64* 5.62*  --   CALCIUM 8.6  --   --   --  8.8  --   MG  --   --   --   --   --  2.0    Liver Function Tests:  Recent Labs Lab 04/13/14 2140  AST 26  ALT 20  ALKPHOS 69  BILITOT 0.6  PROT 5.8*  ALBUMIN 3.1*    Recent Labs Lab 04/14/14 0221  LIPASE 33  AMYLASE 57   No results for input(s): AMMONIA in the last 168 hours.  CBC:  Recent Labs Lab 04/13/14 2140 04/13/14 2152 04/13/14  2323 04/14/14 0221  WBC 12.5*  --   --  11.8*  NEUTROABS 9.2*  --   --   --   HGB 9.8* 12.9* 10.5* 9.0*  HCT 36.6* 38.0* 31.0* 33.8*  MCV 93.6  --   --  94.7  PLT 471*  --   --  461*    Cardiac Enzymes:  Recent Labs Lab 04/14/14 0221 04/14/14 0708  TROPONINI <0.03 0.03    BNP: BNP (last 3 results)  Recent Labs  02/15/14 0350 04/13/14 2140 04/14/14 0222  BNP 75.9 467.9* 333.2*    ProBNP (last 3 results)  Recent Labs  08/05/13 1403 10/28/13 1746  PROBNP 376.6* 325.7*      Other results:  Imaging: Dg Chest Port 1 View  04/14/2014   CLINICAL DATA:  Central line placement.  EXAM: PORTABLE CHEST - 1 VIEW  COMPARISON:  Chest x-ray from the same day at 00:38  FINDINGS: New right IJ central line, tip near the right IJ/ brachiocephalic confluence. A left IJ central line is in similar position, tip at the upper SVC. The endotracheal tube and orogastric tube remains in good position.  Unchanged  cardiomegaly and upper mediastinal widening. There is diffuse interstitial and airspace opacity consistent with pulmonary edema. Layering pleural effusions are likely. No pneumothorax.  IMPRESSION: 1. New right neck central line, tip near the right IJ/ brachiocephalic vein confluence. No pneumothorax. 2. Pre-existing hardware is in unchanged position. 3. Unchanged pulmonary edema.   Electronically Signed   By: Monte Fantasia M.D.   On: 04/14/2014 05:32   Dg Chest Portable 1 View  04/14/2014   CLINICAL DATA:  Endotracheal tube placement.  EXAM: PORTABLE CHEST - 1 VIEW  COMPARISON:  Earlier this day at 0008 hr  FINDINGS: Endotracheal tube 3.3 cm from the carina. Enteric tube in place, mid and distal portions not well seen, tip obscure due to multiple overlying artifacts. Tip of the left central line in the SVC. Cardiomegaly is grossly stable. Bilateral perihilar opacities, minimally improved in the right lung. Bilateral pleural effusions are unchanged.  IMPRESSION: 1. Endotracheal tube 3.3 cm from the carina. 2. Enteric tube in place, however not well visualized beyond the mid distal esophagus due to overlying monitoring devices.   Electronically Signed   By: Jeb Levering M.D.   On: 04/14/2014 01:56   Dg Chest Portable 1 View  04/14/2014   CLINICAL DATA:  Shortness of breath.  Central line placement  EXAM: PORTABLE CHEST - 1 VIEW  COMPARISON:  04/13/2014  FINDINGS: New left IJ catheter, tip at the upper SVC. No indication of pneumothorax.  Progressive bilateral lung opacification. There are layering pleural effusions. Stable cardiopericardial enlargement and vascular pedicle widening.  IMPRESSION: 1. New left IJ catheter is in good position. No visible pneumothorax. 2. Progressive CHF.   Electronically Signed   By: Monte Fantasia M.D.   On: 04/14/2014 00:58   Dg Chest Portable 1 View  04/13/2014   CLINICAL DATA:  Shortness of breath and bradycardia  EXAM: PORTABLE CHEST - 1 VIEW  COMPARISON:  02/20/2014   FINDINGS: Chronic cardiomegaly, with size accentuated by increase in lower mediastinal fat on recent CT. There is widening of the vascular pedicle which is also also stable.  Haziness of the lower chest compatible with pleural effusions and atelectasis. There is pulmonary venous congestion and probable basilar alveolar edema.  IMPRESSION: CHF pattern.   Electronically Signed   By: Monte Fantasia M.D.   On: 04/13/2014 22:15   Dg  Abd Portable 1v  04/14/2014   CLINICAL DATA:  Metabolic acidosis.  EXAM: PORTABLE ABDOMEN - 1 VIEW  COMPARISON:  02/19/2014  FINDINGS: There is a paucity of gas throughout the abdomen. No convincing evidence for bowel obstruction. NG tube is in the stomach. No free air on this portable supine study. No visible organomegaly or suspicious calcification.  IMPRESSION: Nonspecific bowel gas pattern with relative paucity of gas. No evidence of obstruction.   Electronically Signed   By: Rolm Baptise M.D.   On: 04/14/2014 09:19      Medications:     Scheduled Medications: . sodium chloride   Intravenous Once  . antiseptic oral rinse  7 mL Mouth Rinse QID  . chlorhexidine  15 mL Mouth Rinse BID  . famotidine (PEPCID) IV  20 mg Intravenous Q12H  . hydrocortisone sodium succinate  50 mg Intravenous Q6H  . perflutren lipid microspheres (DEFINITY) IV suspension         Infusions: . sodium chloride    . DOPamine 20 mcg/kg/min (04/14/14 0914)  . epinephrine 12 mcg/min (04/14/14 0615)  . fentaNYL infusion INTRAVENOUS 100 mcg/hr (04/14/14 0400)  . furosemide (LASIX) infusion 8 mg/hr (04/14/14 0304)  . insulin (NOVOLIN-R) infusion    . milrinone    . dialysate (PRISMASATE) 2,000 mL/hr at 04/14/14 0913  . sodium bicarbonate (isotonic) 1000 mL infusion 300 mL/hr at 04/14/14 0640  . sodium bicarbonate (isotonic) 1000 mL infusion 200 mL/hr at 04/14/14 0640  .  sodium bicarbonate 150 mEq in sterile water 1000 mL infusion 150 mL/hr at 04/14/14 0505  . vasopressin (PITRESSIN)  infusion - *FOR SHOCK*       PRN Medications:  sodium chloride, alteplase, fentaNYL, heparin, sodium chloride   Assessment/Plan   1.Acute Respiratory Failure- on vent per PCCM 2. A/C Systolic Heart Failure- History of possible tachy-mediated CMP, EF 35-40% on 2/16 echo.  ? Cardiogenic Shock. No clear infectious source.  Persistent hypotension on milrinone, epi, vasopressin, and dopamine.  Repeat ECHO pending. Check CO-OX now and adjust pressors based on results.  3. AKI- now CVVHD 4. Coagulopathy- INR on admit 5.96 coumadin on hold  5. ? Infection- on Vanc and Zosyn. Blood cultures pending.  6. DM II. - on SSI every 4 hours 7. Atrial fibrillation: Chronic, has been on warfarin at home.   Length of Stay: 0  CLEGG,AMY NP-C  04/14/2014, 9:28 AM  Advanced Heart Failure Team Pager [phone] (M-F; 7a - 4p)  Please contact [facility] for night-coverage after hours (4p -7a ) and weekends on [email or web address]  Patient seen with NP, agree with the above note.  Bedside echo being done now shows a vigorous LV with EF 65-70%.  The RV is moderately dilated and dysfunctional with dilated IVC.  Co-ox sent x 2 was 92%.  CVP 18.    Difficult situation to figure out exactly what happened.  I suspect that his RV may be abnormal due to history of OHS/OSA, doubt PE with ongoing warfarin and INR > 5 at admission.  I do not think this is cardiogenic shock based on echo and co-ox (epinephrine/milrinone may be making his EF look better but I do not think he would have particularly bad LV function at baseline).  His procalcitonin and WBC count are not particularly high, so no definite sign of infection.  I wonder if the initial insult was not some form of renal insult with AKI and metabolic derangement, with profound acidosis and hyperkalemia accounting for initial presentation with bradycardia, hypotension, and  obtundation.  However, I am unsure what initial renal insult would have been and no family available.  There  is apparently a history of diarrhea for a number of days. HR controlled in afib currently (chronic afib).  For now, would: 1. Stop milrinone 2. Transition epinephrine to norepinephrine.  3. Follow co-ox.  4. Continue CVVH and supportive care as you are doing.  5. Continue empiric antibiotics for now.    45 min critical care time.   Loralie Champagne 04/14/2014 11:18 AM

## 2014-04-14 NOTE — Progress Notes (Signed)
ANTIBIOTIC CONSULT NOTE - INITIAL  Pharmacy Consult for vancomycin and cefepime Indication: rule out pneumonia  Allergies  Allergen Reactions  . Codeine     Headache     Patient Measurements: Height: 5' 4"  (162.6 cm) Weight: 268 lb 1.3 oz (121.6 kg) IBW/kg (Calculated) : 59.2  Vital Signs: Temp: 93.4 F (34.1 C) (04/10 2147) Temp Source: Rectal (04/10 2147) BP: 83/42 mmHg (04/11 0106) Pulse Rate: 75 (04/11 0106)  Labs:  Recent Labs  04/13/14 2140 04/13/14 2152 04/13/14 2323  WBC 12.5*  --   --   HGB 9.8* 12.9* 10.5*  PLT 471*  --   --   CREATININE 5.66* 5.80* 5.30*   Estimated Creatinine Clearance: 17.2 mL/min (by C-G formula based on Cr of 5.3).  Medical History: Past Medical History  Diagnosis Date  . Diabetes   . Colon cancer   . Hypertension   . Sleep apnea   . Atrial fibrillation   . Hyperlipidemia   . CHF (congestive heart failure)   . Colon polyps 09/21/2012    Tubular adenoma     Assessment: 64yo male c/o SOB and weakness x3wk, now w/ diarrhea for past 3d, found to be hypotensive, has recent h/o CO poisoning, concern for digoxin toxicity and given Digibind; CCM admitting w/ cardiogenic shock, ARF, bradycardia, hyperkalemia, severe metabolic acidosis, respiratory failure w/ possible PNA, to begin IV ABX; noted that pt will likely need to be started on CRRT.  Goal of Therapy:  Vancomycin trough level 15-20 mcg/ml  Plan:  Will give vancomycin 2540m IV x1 and cefepime 2g IV x1 and f/u w/ plans prior to additional doses.  VWynona Neat PharmD, BCPS  04/14/2014,1:20 AM

## 2014-04-14 NOTE — Progress Notes (Signed)
INITIAL NUTRITION ASSESSMENT  DOCUMENTATION CODES Per approved criteria  -Morbid Obesity   INTERVENTION:  Pt not stable for feedings today, will follow up 4/12   Initiate Vital High Protein @ 20 ml/hr via OG tube and increase by 10 ml every 4 hours to goal rate of 40 ml/hr.  30 ml Prostat five times per day.   Tube feeding regimen provides 1460 kcal (12 kcal/kg of ABW), 159 grams of protein, and 802 ml of H2O.   NUTRITION DIAGNOSIS: Inadequate oral intake related to inability to eat as evidenced by NPO status  Goal: Enteral nutrition to provide 65-70% of estimated calorie needs based on ASPEN guidelines for hypocaloric, high protein feeding in critically ill obese individuals  Monitor:  Respiratory status, weight trends, labs, TF tolerance/adequacy, I&O's  Reason for Assessment: Consult received to initiate and manage enteral nutrition support.  ASSESSMENT: Pt with hx of CHF, DM, OSA admitted with increased dyspnea and hypotension.  Pt intubated and started CRRT 4/11 for AKI.   Pt with recent hx of diarrhea PTA, KUB showed nonspecific bowel gas pattern and no evidence of obstruction.   Spoke with MD and he wishes to start TF today, however upon further review pt does not appear that he is stable enough to start feedings today.  MAP: 48, 54, 45, 104 Spoke with RN's, they do not feel that they will achieve a MAP of >60 today despite treatments.   Patient is currently intubated on ventilator support MV: 14 L/min Temp (24hrs), Avg:95.1 F (35.1 C), Min:93.2 F (34 C), Max:95.8 F (35.4 C)  CBG's: 255-311  No signs of fat or muscle depletion noted on exam.   Height: Ht Readings from Last 1 Encounters:  04/14/14 5' 7"  (1.702 m)    Weight: Wt Readings from Last 1 Encounters:  04/14/14 298 lb 8.1 oz (135.4 kg)  Usual weight: 261 lb (118.7 kg) 02/2014  Ideal Body Weight: 61.3 kg   % Ideal Body Weight: 205%  Wt Readings from Last 10 Encounters:  04/14/14 298 lb 8.1  oz (135.4 kg)  03/20/14 268 lb (121.564 kg)  03/19/14 266 lb (120.657 kg)  02/26/14 261 lb 14.4 oz (118.797 kg)  02/10/14 294 lb 12.8 oz (133.72 kg)  12/19/13 284 lb (128.822 kg)  11/29/13 282 lb (127.914 kg)  11/21/13 273 lb 14.4 oz (124.24 kg)  11/19/13 273 lb 6.4 oz (124.013 kg)  11/13/13 277 lb 12.8 oz (126.009 kg)    Usual Body Weight: 265-270 lb   % Usual Body Weight: > 100%  BMI:  Body mass index is 46.74 kg/(m^2).  Estimated Nutritional Needs: Kcal: 4888-9169 Protein: >/= 153 grams Fluid: > 1.5 L/day  Skin: intact  Diet Order: Diet NPO time specified  EDUCATION NEEDS: -No education needs identified at this time   Intake/Output Summary (Last 24 hours) at 04/14/14 1148 Last data filed at 04/14/14 1100  Gross per 24 hour  Intake 1649.57 ml  Output    872 ml  Net 777.57 ml    Last BM: 4/10   Labs:   Recent Labs Lab 04/13/14 2140  04/13/14 2323 04/14/14 0221 04/14/14 0225 04/14/14 0500 04/14/14 0708  NA 134*  < > 135  --  138 138  --   K 6.8*  < > 5.6*  --  5.3* 4.9  --   CL 102  < > 109  --  103 101  --   CO2 20  --   --   --  18* 13*  --  BUN 89*  < > 90*  --  85* 74*  --   CREATININE 5.66*  < > 5.30* 5.64* 5.62* 5.10*  --   CALCIUM 8.6  --   --   --  8.8 8.5  --   MG  --   --   --   --   --   --  2.0  PHOS  --   --   --   --   --  6.8*  --   GLUCOSE 196*  < > 217*  --  255* 307*  --   < > = values in this interval not displayed.  CBG (last 3)   Recent Labs  04/14/14 0730 04/14/14 0841 04/14/14 0954  GLUCAP 311* 290* 273*    Scheduled Meds: . sodium chloride   Intravenous Once  . antiseptic oral rinse  7 mL Mouth Rinse QID  . chlorhexidine  15 mL Mouth Rinse BID  . famotidine (PEPCID) IV  20 mg Intravenous Q12H  . hydrocortisone sodium succinate  50 mg Intravenous Q6H    Continuous Infusions: . sodium chloride    . DOPamine 20 mcg/kg/min (04/14/14 0914)  . epinephrine 12 mcg/min (04/14/14 0615)  . fentaNYL infusion  INTRAVENOUS 100 mcg/hr (04/14/14 0400)  . furosemide (LASIX) infusion 8 mg/hr (04/14/14 0304)  . insulin (NOVOLIN-R) infusion 6.5 Units/hr (04/14/14 1101)  . milrinone Stopped (04/14/14 1104)  . norepinephrine (LEVOPHED) Adult infusion 20 mcg/min (04/14/14 1127)  . dialysate (PRISMASATE) 2,000 mL/hr at 04/14/14 0913  . sodium bicarbonate (isotonic) 1000 mL infusion 300 mL/hr at 04/14/14 1111  . sodium bicarbonate (isotonic) 1000 mL infusion 200 mL/hr at 04/14/14 0928  .  sodium bicarbonate 150 mEq in sterile water 1000 mL infusion Stopped (04/14/14 0800)  . vasopressin (PITRESSIN) infusion - *FOR SHOCK* 0.03 Units/min (04/14/14 0815)    [place], Vance, CNSC [phone] Pager [phone] After Hours Pager

## 2014-04-14 NOTE — Procedures (Signed)
Under sterile conditions and using US guidance, a 3-lumen 15-cm temporary Trialysis dialysis catheter was placed into the right IJ vein w/o complications.  Post cxr shows no PTX, ready to use.    Kelly Splinter MD (pgr) [phone]    ([phone] 04/14/2014, 4:09 AM

## 2014-04-14 NOTE — ED Notes (Signed)
This RN and ED techs attempted to place temperature sensing foley catheter. 66f

## 2014-04-14 NOTE — Progress Notes (Signed)
ANTIBIOTIC CONSULT NOTE - FOLLOW UP  Pharmacy Consult for vancomycin and cefepime Indication: rule out pneumonia/sepsis  Allergies  Allergen Reactions  . Codeine     Headache     Patient Measurements: Height: 5' 7"  (170.2 cm) Weight: 298 lb 8.1 oz (135.4 kg) IBW/kg (Calculated) : 66.1 Adjusted Body Weight: n/a  Vital Signs: Temp: 96.1 F (35.6 C) (04/11 1330) Temp Source: Core (Comment) (04/11 0600) BP: 78/35 mmHg (04/11 0920) Pulse Rate: 78 (04/11 1330) Intake/Output from previous day: 04/10 0701 - 04/11 0700 In: 1444.6 [I.V.:844.6; IV Piggyback:600] Out: 12  Intake/Output from this shift: Total I/O In: 205 [Other:205] Out: 1127 [Other:1127]  Labs:  Recent Labs  04/13/14 2140 04/13/14 2152 04/13/14 2323 04/14/14 0221 04/14/14 0225 04/14/14 0500  WBC 12.5*  --   --  11.8*  --   --   HGB 9.8* 12.9* 10.5* 9.0*  --   --   PLT 471*  --   --  461*  --   --   CREATININE 5.66* 5.80* 5.30* 5.64* 5.62* 5.10*   Estimated Creatinine Clearance: 19.9 mL/min (by C-G formula based on Cr of 5.1). No results for input(s): VANCOTROUGH, VANCOPEAK, VANCORANDOM, GENTTROUGH, GENTPEAK, GENTRANDOM, TOBRATROUGH, TOBRAPEAK, TOBRARND, AMIKACINPEAK, AMIKACINTROU, AMIKACIN in the last 72 hours.   Microbiology: Recent Results (from the past 720 hour(s))  MRSA PCR Screening     Status: None   Collection Time: 04/14/14  2:42 AM  Result Value Ref Range Status   MRSA by PCR NEGATIVE NEGATIVE Final    Comment:        The GeneXpert MRSA Assay (FDA approved for NASAL specimens only), is one component of a comprehensive MRSA colonization surveillance program. It is not intended to diagnose MRSA infection nor to guide or monitor treatment for MRSA infections.     Anti-infectives    Start     Dose/Rate Route Frequency Ordered Stop   04/15/14 0600  vancomycin (VANCOCIN) 1,250 mg in sodium chloride 0.9 % 250 mL IVPB     1,250 mg 166.7 mL/hr over 90 Minutes Intravenous Every 24 hours  04/14/14 1326     04/14/14 1800  ceFEPIme (MAXIPIME) 2 g in dextrose 5 % 50 mL IVPB     2 g 100 mL/hr over 30 Minutes Intravenous Every 12 hours 04/14/14 1326     04/14/14 0115  vancomycin (VANCOCIN) 2,500 mg in sodium chloride 0.9 % 500 mL IVPB     2,500 mg 250 mL/hr over 120 Minutes Intravenous  Once 04/14/14 0113 04/14/14 0540   04/14/14 0115  ceFEPIme (MAXIPIME) 2 g in dextrose 5 % 50 mL IVPB     2 g 100 mL/hr over 30 Minutes Intravenous  Once 04/14/14 0113 04/14/14 0646      Assessment: 64yo male c/o SOB and weakness x3wk, now w/ diarrhea for past 3d, found to be hypotensive, has recent h/o CO poisoning, concern for digoxin toxicity and given Digibind; CCM admitting w/ cardiogenic shock, ARF, bradycardia, hyperkalemia, severe metabolic acidosis, respiratory failure w/ possible PNA, to begin IV ABX  CRRT started today.  Goal of Therapy:  Vancomycin trough level 15-20 mcg/ml  Plan:  1. Cefepime 2g IV q 12 hrs 2. Vancomycin 1250 mg IV q 24 hrs for now.  Dose may be on lower end, but will depend on how smoothly CRRT runs.  Will check trough level as soon as able at steady state.  Uvaldo Rising, BCPS  Clinical Pharmacist Pager [phone]  04/14/2014 1:46 PM

## 2014-04-14 NOTE — Procedures (Signed)
I was present at this CRRT session, have reviewed the session itself and made  appropriate changes  Kelly Splinter MD (pgr) [phone]    ([phone] 04/14/2014, 4:09 AM

## 2014-04-14 NOTE — H&P (Signed)
PULMONARY / CRITICAL CARE MEDICINE   Name: William Flores MRN: [id number] DOB: Jul 07, 1950    ADMISSION DATE:  04/13/2014 CONSULTATION DATE:  04/13/14  REFERRING MD :  Ashok Cordia  CHIEF COMPLAINT:  Shortness of breath  INITIAL PRESENTATION: 64 y/o male came to the [facility] ED on 4/10 by EMS for "low oxygen and trouble breathing".  He was found to be in cardiogenic shock, bradycardic, in acute renal failure, hyperkalemic, and with lactic acidosis.   STUDIES:    SIGNIFICANT EVENTS:   HISTORY OF PRESENT ILLNESS:  64 y/o male came to the [facility] ED on 4/10 by EMS for "low oxygen and trouble breathing".  He was found to be in cardiogenic shock, bradycardic, in acute renal failure, hyperkalemic, and in a severe metabolic acidosis. There was no family to provide history and the patient was encephalopathic so no further history could be obtained.  PAST MEDICAL HISTORY :   has a past medical history of Diabetes; Colon cancer; Hypertension; Sleep apnea; Atrial fibrillation; Hyperlipidemia; CHF (congestive heart failure); and Colon polyps (09/21/2012).  has past surgical history that includes Colon resection; Circumcision; Colonoscopy (N/A, 09/21/2012); and US echocardiography (03/04/2010). Prior to Admission medications   Medication Sig Start Date End Date Taking? Authorizing Provider  allopurinol (ZYLOPRIM) 300 MG tablet TAKE 1 TABLET (300 MG TOTAL) BY MOUTH DAILY. ALSO TAKE 100MG DAILY TO EQUAL 400MG TOTAL. 01/01/14   Wardell Honour, MD  aspirin 81 MG tablet Take 81 mg by mouth daily.    Historical Provider, MD  atorvastatin (LIPITOR) 20 MG tablet Take 1 tablet (20 mg total) by mouth daily at 6 PM. 02/26/14   Janece Canterbury, MD  azelastine (OPTIVAR) 0.05 % ophthalmic solution Place 2 drops into both eyes 2 (two) times daily. 04/19/13   Vernie Shanks, MD  benzonatate (TESSALON) 100 MG capsule Take 1 capsule (100 mg total) by mouth 2 (two) times daily. 04/04/14   Mary-Margaret Hassell Done, FNP  ferrous sulfate 325 (65  FE) MG tablet Take 1 tablet (325 mg total) by mouth 2 (two) times daily with a meal. 02/26/14   Janece Canterbury, MD  furosemide (LASIX) 40 MG tablet Take 40 mg by mouth daily. 01/18/14   Historical Provider, MD  glucose blood test strip Use to check BG daily 10/07/13   Tammy Eckard, PHARMD  lisinopril (PRINIVIL,ZESTRIL) 5 MG tablet 1 tab daily 03/27/14   Liliane Shi, PA-C  metolazone (ZAROXOLYN) 2.5 MG tablet TAKE 1 TABLET (2.5 MG TOTAL) BY MOUTH DAILY. 03/24/14   Mary-Margaret Hassell Done, FNP  metoprolol succinate (TOPROL-XL) 50 MG 24 hr tablet Take 1 tablet (50 mg total) by mouth 2 (two) times daily. Take with or immediately following a meal. 02/26/14   Janece Canterbury, MD  omeprazole (PRILOSEC) 20 MG capsule TAKE 1 CAPSULE (20 MG TOTAL) BY MOUTH DAILY. 12/17/13   Lysbeth Penner, FNP  [facility] DELICA LANCETS 39J MISC 1 each by Does not apply route daily. Use one lancet to check BG daily 09/13/12   Tammy Eckard, PHARMD  sitaGLIPtin-metformin (JANUMET) 50-1000 MG per tablet Take 1 tablet by mouth 2 (two) times daily with a meal. 08/05/13   Lysbeth Penner, FNP  warfarin (COUMADIN) 10 MG tablet Take 1 tablet (10 mg total) by mouth daily at 6 PM. 02/26/14   Janece Canterbury, MD   Allergies  Allergen Reactions  . Codeine     Headache     FAMILY HISTORY:  has no family status information on file.  SOCIAL HISTORY:  reports  that he quit smoking about 30 years ago. He has never used smokeless tobacco. He reports that he does not drink alcohol or use illicit drugs.  REVIEW OF SYSTEMS:  Cannot obtain due to confusion  SUBJECTIVE:   VITAL SIGNS: Temp:  [93.4 F (34.1 C)-95.8 F (35.4 C)] 93.4 F (34.1 C) (04/10 2147) Pulse Rate:  [35-100] 74 (04/11 0010) Resp:  [9-30] 16 (04/11 0010) BP: (72-104)/(26-73) 102/34 mmHg (04/11 0010) SpO2:  [78 %-100 %] 98 % (04/11 0010) FiO2 (%):  [100 %] 100 % (04/10 2231) HEMODYNAMICS:   VENTILATOR SETTINGS: Vent Mode:  [-] BIPAP FiO2 (%):  [100 %] 100 % Set  Rate:  [15 bmp] 15 bmp PEEP:  [8 cmH20] 8 cmH20 INTAKE / OUTPUT: No intake or output data in the 24 hours ending 04/14/14 0018  PHYSICAL EXAMINATION: General:  Obese, restless Neuro:  Sonorous, withdraws to pain, non-verbal HEENT:  NCAT, EOMi, BIPAP Cardiovascular:  Loletha Grayer, distant heart sounds, edema extremities Lungs:  Few crackles in bases, no wheezing Abdomen:  BS+, soft, nontneder Musculoskeletal:  Decreased bulk and tone Skin:  Cool, acyanotic, dry  LABS:  CBC  Recent Labs Lab 04/13/14 2140 04/13/14 2152 04/13/14 2323  WBC 12.5*  --   --   HGB 9.8* 12.9* 10.5*  HCT 36.6* 38.0* 31.0*  PLT 471*  --   --    Coag's  Recent Labs Lab 04/13/14 2140  INR 5.96*   BMET  Recent Labs Lab 04/13/14 2140 04/13/14 2152 04/13/14 2323  NA 134* 134* 135  K 6.8* 6.6* 5.6*  CL 102 105 109  CO2 20  --   --   BUN 89* 99* 90*  CREATININE 5.66* 5.80* 5.30*  GLUCOSE 196* 192* 217*   Electrolytes  Recent Labs Lab 04/13/14 2140  CALCIUM 8.6   Sepsis Markers  Recent Labs Lab 04/13/14 2151  LATICACIDVEN 5.68*   ABG  Recent Labs Lab 04/13/14 2212 04/13/14 2322  PHART 7.064* 7.077*  PCO2ART 53.7* 51.3*  PO2ART 99.0 118.0*   Liver Enzymes  Recent Labs Lab 04/13/14 2140  AST 26  ALT 20  ALKPHOS 69  BILITOT 0.6  ALBUMIN 3.1*   Cardiac Enzymes No results for input(s): TROPONINI, PROBNP in the last 168 hours. Glucose No results for input(s): GLUCAP in the last 168 hours.  Imaging Dg Chest Portable 1 View  04/13/2014   CLINICAL DATA:  Shortness of breath and bradycardia  EXAM: PORTABLE CHEST - 1 VIEW  COMPARISON:  02/20/2014  FINDINGS: Chronic cardiomegaly, with size accentuated by increase in lower mediastinal fat on recent CT. There is widening of the vascular pedicle which is also also stable.  Haziness of the lower chest compatible with pleural effusions and atelectasis. There is pulmonary venous congestion and probable basilar alveolar edema.   IMPRESSION: CHF pattern.   Electronically Signed   By: Monte Fantasia M.D.   On: 04/13/2014 22:15     ASSESSMENT / PLAN:  PULMONARY OETT 4/11 >> A: Acute respiratory failure due to acute pulmonary edema from acute cardiogenic shock, ddx includes HCAP but this seems less likely P:   -see ID -see cardiology -full vent support -high PEEP strategy given morbid obesity, high transpulmonary pressure -diurese  CARDIOVASCULAR CVL L IJ 4/11 >> A: Acute cardiogenic shock, ddx includes sepsis but no clear source Acute systolic heart failure Bradycardia due to metabolic acidosis > discussed temporary pacer with cardiology, they feel this is due primarily to metabolic acidosis so recommend focusing treatment on metabolic derrangements Chronic  atrial fibriliation Hypertension P:  -cardiology consult -hold home BP medications (metoprolol, lisinopril) -wean off epi gtt -milrinone and dopamine overnight -titrate dopamine to MAP > 65 -CVP -coox -serial lactic acid -lasix gtt  RENAL A:  Acute kidney injury, presumably due to lack of forward flow Metabolic acidosis> lactic acidosis due to shock Hyperkalemia P:   -consult renal, likely needs CVVHD -treat hyperkalemia with d50, insulin, calcium  GASTROINTESTINAL A:  Diarrhea P:   -monitor stool output -OG tube -tube feedings for nutrition -pepcid for SUP  HEMATOLOGIC A:  Coagulopathy due to warfarin P:  -correct INR  -monitor for bleeding -hold warfarin  INFECTIOUS A:  This whole picture likely represents cardiogenic shock, but he does meet SIRS criteria and is arguably dying from this illness.  So while I think it it just cardiogenic shock, will cover with antibiotics for 48 hours to cover HCAP as that is difficult to rule out. P:   BCx2 4/11  Sputum 4/11  Abx: vanc, start date 4/11 > Abx Zosyn, start date 4/11 >  ENDOCRINE A:  DM2 P:   -SSI q4  NEUROLOGIC A:  Acute encephalopathy due to hypoxemia, heart failure,  hypercapnea Vent synchrony needs P:   RASS goal: -1 Fentanyl gtt   FAMILY  - Updates: Son [name] updated by me on phone on 4/10.  I advised the severity of this illness and clarified code status. I explained to him the severity of this acute illness and that his father's prognosis is poor given his overall poor health and multiple comorbid illnesses.  My cc time 90 minutes  Roselie Awkward, MD [place] [facility] Pager: [phone] Cell: [phone] If no response, call [phone]   04/14/2014, 12:18 AM

## 2014-04-14 NOTE — Procedures (Signed)
Arterial Catheter Insertion Procedure Note William Flores [id number] Mar 13, 1950  Procedure: Insertion of Arterial Catheter  Indications: Blood pressure monitoring  Procedure Details Consent: Unable to obtain consent because of emergent medical necessity. Time Out: Verified patient identification, verified procedure, site/side was marked, verified correct patient position, special equipment/implants available, medications/allergies/relevent history reviewed, required imaging and test results available.  Performed  Maximum sterile technique was used including antiseptics, cap, gloves, gown, hand hygiene, mask and sheet. Skin prep: Chlorhexidine; local anesthetic administered 20 gauge catheter was inserted into right radial artery using the Seldinger technique.  Evaluation Blood flow good; BP tracing good. Complications: No apparent complications.  Arterial catheterization performed with no complications. Good waveform noted.  [place] 04/14/2014

## 2014-04-14 NOTE — ED Provider Notes (Signed)
CSN: [id number]     Arrival date & time 04/13/14  2117 History   First MD Initiated Contact with Patient 04/13/14 2121     Chief Complaint  Patient presents with  . Shortness of Breath  . Bradycardia     (Consider location/radiation/quality/duration/timing/severity/associated sxs/prior Treatment) HPI  The following history is limited due to acuity of patient's condition as well as respiratory distress:  This is a 64 yo male with PMH HTN, a fib, HLD, CHF, sleep apnea, discharged around two months ago after being admitted for HF exacerbation, AoCKD, presenting today with respiratory distress.  Unknown when this started.  Supposedly started at home, although last discharge summary indicates that the pt was discharged to SNF.  Pt denies taking any new meds. He denies CP or abdominal pain.  Negative for fever.  Positive for chills.      Past Medical History  Diagnosis Date  . Diabetes   . Colon cancer   . Hypertension   . Sleep apnea   . Atrial fibrillation   . Hyperlipidemia   . CHF (congestive heart failure)   . Colon polyps 09/21/2012    Tubular adenoma   Past Surgical History  Procedure Laterality Date  . Colon resection    . Circumcision    . Colonoscopy N/A 09/21/2012    Procedure: COLONOSCOPY;  Surgeon: Inda Castle, MD;  Location: [facility] ENDOSCOPY;  Service: Endoscopy;  Laterality: N/A;  . US echocardiography  03/04/2010    abnormal study   Family History  Problem Relation Age of Onset  . Breast cancer Mother   . Diabetes Mother    History  Substance Use Topics  . Smoking status: Former Smoker    Quit date: 08/06/1983  . Smokeless tobacco: Never Used  . Alcohol Use: No    Review of Systems  Unable to perform ROS: Acuity of condition      Allergies  Codeine  Home Medications   Prior to Admission medications   Medication Sig Start Date End Date Taking? Authorizing Provider  allopurinol (ZYLOPRIM) 300 MG tablet TAKE 1 TABLET (300 MG TOTAL) BY MOUTH DAILY.  ALSO TAKE 100MG DAILY TO EQUAL 400MG TOTAL. 01/01/14   Wardell Honour, MD  aspirin 81 MG tablet Take 81 mg by mouth daily.    Historical Provider, MD  atorvastatin (LIPITOR) 20 MG tablet Take 1 tablet (20 mg total) by mouth daily at 6 PM. 02/26/14   Janece Canterbury, MD  azelastine (OPTIVAR) 0.05 % ophthalmic solution Place 2 drops into both eyes 2 (two) times daily. 04/19/13   Vernie Shanks, MD  benzonatate (TESSALON) 100 MG capsule Take 1 capsule (100 mg total) by mouth 2 (two) times daily. 04/04/14   Mary-Margaret Hassell Done, FNP  ferrous sulfate 325 (65 FE) MG tablet Take 1 tablet (325 mg total) by mouth 2 (two) times daily with a meal. 02/26/14   Janece Canterbury, MD  furosemide (LASIX) 40 MG tablet Take 40 mg by mouth daily. 01/18/14   Historical Provider, MD  glucose blood test strip Use to check BG daily 10/07/13   Tammy Eckard, PHARMD  lisinopril (PRINIVIL,ZESTRIL) 5 MG tablet 1 tab daily 03/27/14   Liliane Shi, PA-C  metolazone (ZAROXOLYN) 2.5 MG tablet TAKE 1 TABLET (2.5 MG TOTAL) BY MOUTH DAILY. 03/24/14   Mary-Margaret Hassell Done, FNP  metoprolol succinate (TOPROL-XL) 50 MG 24 hr tablet Take 1 tablet (50 mg total) by mouth 2 (two) times daily. Take with or immediately following a meal. 02/26/14  Janece Canterbury, MD  omeprazole (PRILOSEC) 20 MG capsule TAKE 1 CAPSULE (20 MG TOTAL) BY MOUTH DAILY. 12/17/13   Lysbeth Penner, FNP  [facility] DELICA LANCETS 68E MISC 1 each by Does not apply route daily. Use one lancet to check BG daily 09/13/12   Tammy Eckard, PHARMD  sitaGLIPtin-metformin (JANUMET) 50-1000 MG per tablet Take 1 tablet by mouth 2 (two) times daily with a meal. 08/05/13   Lysbeth Penner, FNP  warfarin (COUMADIN) 10 MG tablet Take 1 tablet (10 mg total) by mouth daily at 6 PM. 02/26/14   Janece Canterbury, MD   BP 102/34 mmHg  Pulse 74  Temp(Src) 93.4 F (34.1 C) (Rectal)  Resp 16  SpO2 98% Physical Exam  Constitutional: He appears well-developed and well-nourished. He appears distressed.   HENT:  Head: Normocephalic and atraumatic.  Mouth/Throat: No oropharyngeal exudate.  Eyes: Conjunctivae are normal. Pupils are equal, round, and reactive to light. No scleral icterus.  Neck: Normal range of motion. No tracheal deviation present. No thyromegaly present.  Cardiovascular: Exam reveals no friction rub.   Bradycardic Heart sounds distant  Pulmonary/Chest: Breath sounds normal. No stridor. He is in respiratory distress. He has no wheezes. He has no rales. He exhibits no tenderness.  Abdominal: Soft. He exhibits no distension and no mass. There is no tenderness. There is no rebound and no guarding.  Musculoskeletal: Normal range of motion. He exhibits no edema.  Neurological:  drowsy  Skin: Skin is dry. He is not diaphoretic. There is pallor.  Cold    ED Course  Procedures (including critical care time) Labs Review Labs Reviewed  CBC WITH DIFFERENTIAL/PLATELET - Abnormal; Notable for the following:    WBC 12.5 (*)    RBC 3.91 (*)    Hemoglobin 9.8 (*)    HCT 36.6 (*)    MCH 25.1 (*)    MCHC 26.8 (*)    RDW 24.5 (*)    Platelets 471 (*)    Neutro Abs 9.2 (*)    Monocytes Absolute 1.1 (*)    All other components within normal limits  COMPREHENSIVE METABOLIC PANEL - Abnormal; Notable for the following:    Sodium 134 (*)    Potassium 6.8 (*)    Glucose, Bld 196 (*)    BUN 89 (*)    Creatinine, Ser 5.66 (*)    Total Protein 5.8 (*)    Albumin 3.1 (*)    GFR calc non Af Amer 10 (*)    GFR calc Af Amer 11 (*)    All other components within normal limits  BRAIN NATRIURETIC PEPTIDE - Abnormal; Notable for the following:    B Natriuretic Peptide 467.9 (*)    All other components within normal limits  DIGOXIN LEVEL - Abnormal; Notable for the following:    Digoxin Level <0.2 (*)    All other components within normal limits  PROTIME-INR - Abnormal; Notable for the following:    Prothrombin Time 53.6 (*)    INR 5.96 (*)    All other components within normal limits   I-STAT CG4 LACTIC ACID, ED - Abnormal; Notable for the following:    Lactic Acid, Venous 5.68 (*)    All other components within normal limits  I-STAT CHEM 8, ED - Abnormal; Notable for the following:    Sodium 134 (*)    Potassium 6.6 (*)    BUN 99 (*)    Creatinine, Ser 5.80 (*)    Glucose, Bld 192 (*)  Hemoglobin 12.9 (*)    HCT 38.0 (*)    All other components within normal limits  I-STAT ARTERIAL BLOOD GAS, ED - Abnormal; Notable for the following:    pH, Arterial 7.064 (*)    pCO2 arterial 53.7 (*)    Bicarbonate 16.0 (*)    Acid-base deficit 15.0 (*)    All other components within normal limits  I-STAT ARTERIAL BLOOD GAS, ED - Abnormal; Notable for the following:    pH, Arterial 7.077 (*)    pCO2 arterial 51.3 (*)    pO2, Arterial 118.0 (*)    Bicarbonate 15.1 (*)    Acid-base deficit 15.0 (*)    All other components within normal limits  I-STAT CHEM 8, ED - Abnormal; Notable for the following:    Potassium 5.6 (*)    BUN 90 (*)    Creatinine, Ser 5.30 (*)    Glucose, Bld 217 (*)    Hemoglobin 10.5 (*)    HCT 31.0 (*)    All other components within normal limits  CULTURE, BLOOD (ROUTINE X 2)  CULTURE, BLOOD (ROUTINE X 2)  URINE CULTURE  ETHANOL  URINALYSIS, ROUTINE W REFLEX MICROSCOPIC  I-STAT TROPOININ, ED  I-STAT ARTERIAL BLOOD GAS, ED  I-STAT CG4 LACTIC ACID, ED    Imaging Review Dg Chest Portable 1 View  04/13/2014   CLINICAL DATA:  Shortness of breath and bradycardia  EXAM: PORTABLE CHEST - 1 VIEW  COMPARISON:  02/20/2014  FINDINGS: Chronic cardiomegaly, with size accentuated by increase in lower mediastinal fat on recent CT. There is widening of the vascular pedicle which is also also stable.  Haziness of the lower chest compatible with pleural effusions and atelectasis. There is pulmonary venous congestion and probable basilar alveolar edema.  IMPRESSION: CHF pattern.   Electronically Signed   By: Monte Fantasia M.D.   On: 04/13/2014 22:15     EKG  Interpretation   Date/Time:  Sunday April 13 2014 21:19:15 EDT Ventricular Rate:  42 PR Interval:    QRS Duration: 110 QT Interval:  533 QTC Calculation: 445 R Axis:   101 Text Interpretation:  Atrial fibrillation with slow ventricular response  `severe bradycardia, ?digoxin toxicity Low voltage, extremity and  precordial leads Confirmed by Ashok Cordia  MD, Lennette Bihari ([id number]) on 04/13/2014  11:14:53 PM      MDM   Final diagnoses:  Cardiogenic shock  Bradycardia    The following history is limited due to acuity of patient's condition as well as respiratory distress:  This is a 64 yo male with PMH HTN, a fib, HLD, CHF, sleep apnea, discharged around two months ago after being admitted for HF exacerbation, AoCKD, presenting today with respiratory distress.  Unknown when this started.  Supposedly started at home, although last discharge summary indicates that the pt was discharged to SNF.  Pt denies taking any new meds. He denies CP or abdominal pain.  Negative for fever.  Positive for chills.     On initial exam, pt appears extremely ill.  He is bradycardic, with SBP in the 50s on palpation.  He is in respiratory distress.  I don't appreciate obvious abnormalities on auscultation of his lungs.  His cap refill's delayed.  He is drowsy but oriented.  He states that he is full code after long discussion on my part.  Airway intact at this time, but I am concerned he will decline from that perspective rapidly.  BiPap started.  2 peripheral 18 gauge IVs have been started.  Fluids  started.  I do not appreciate p waves on EKG.  Appears to be either junctional rhythm or complete block.  Istat blood sent, labs ordered, CXR ordered.  Digibind ordered as dig   Initial results reveal AKI, hyperkalemia, profound hypoventilation. Calcium, insulin, d50, high dose albuterol started.  Will delay intubation for now as I am concerned his current hemodynamics would not allow for it.  Cards, intensive care consulted.   Cards does not recommend pacing at this time.  I have started epi drip.  Intensive care has presented to bedside, will start central IV, attempt intubation.    Intensive care has successfully performed procedures.  Will admit to ICU, will receive HD at that time.  Pt is being admitted to the ICU with stable BP, good HR.    I have discussed case and care has been guided by my attending physician, Ashok Cordia.  Doy Hutching, MD 04/14/14 0045  Lajean Saver, MD 04/14/14 0111

## 2014-04-14 NOTE — Progress Notes (Addendum)
     SUBJECTIVE: Intubated. Sedated.   Tele: atrial fib  BP 88/44 mmHg  Pulse 86  Temp(Src) 95.5 F (35.3 C) (Core (Comment))  Resp 0  Ht 5' 7"  (1.702 m)  Wt 298 lb 8.1 oz (135.4 kg)  BMI 46.74 kg/m2  SpO2 100%  Intake/Output Summary (Last 24 hours) at 04/14/14 0839 Last data filed at 04/14/14 0800  Gross per 24 hour  Intake 1444.57 ml  Output     12 ml  Net 1432.57 ml    PHYSICAL EXAM General: Intubated, sedated. Obese Neck: No JVD. No masses noted.  Lungs: Mechanical breath sounds.  Heart: Irregular Abdomen: Bowel sounds are present. Soft, non-tender.  Extremities: No lower extremity edema.   LABS: Basic Metabolic Panel:  Recent Labs  04/13/14 2140  04/13/14 2323 04/14/14 0221 04/14/14 0225  NA 134*  < > 135  --  138  K 6.8*  < > 5.6*  --  5.3*  CL 102  < > 109  --  103  CO2 20  --   --   --  18*  GLUCOSE 196*  < > 217*  --  255*  BUN 89*  < > 90*  --  85*  CREATININE 5.66*  < > 5.30* 5.64* 5.62*  CALCIUM 8.6  --   --   --  8.8  < > = values in this interval not displayed. CBC:  Recent Labs  04/13/14 2140  04/13/14 2323 04/14/14 0221  WBC 12.5*  --   --  11.8*  NEUTROABS 9.2*  --   --   --   HGB 9.8*  < > 10.5* 9.0*  HCT 36.6*  < > 31.0* 33.8*  MCV 93.6  --   --  94.7  PLT 471*  --   --  461*  < > = values in this interval not displayed. Cardiac Enzymes:  Recent Labs  04/14/14 0221  TROPONINI <0.03    Current Meds: . sodium chloride   Intravenous Once  . antiseptic oral rinse  7 mL Mouth Rinse QID  . chlorhexidine  15 mL Mouth Rinse BID  . famotidine (PEPCID) IV  20 mg Intravenous Q12H  . hydrocortisone sodium succinate  50 mg Intravenous Q6H   ASSESSMENT AND PLAN: 64 y/o male came [facility] the [facility] ED on 4/10 by EMS for "low oxygen and trouble breathing". He was found to be in cardiogenic shock, bradycardic, in acute renal failure, hyperkalemic, and with lactic acidosis. Digoxin and Toprol were held.   1. Bradycardia: In setting of  metabolic derangement with acute renal failure, hyperkalemia. Digoxin and Toprol have been held. He is now tachycardic with atrial fib.   2. Cardiogenic shock/Non-ischemic cardiomyopathy/Acute systolic CHF: He is known to have LVEF=35-40% by echo February 2016. Echo is pending this admission. Troponin negative. He is on multiple drips including Vasopressin, Epi, dopamine, milrinone drips. Also on Lasix drip. Will ask CHF team to see him today as he is critically ill.   3. Chronic atrial fibrillation: Rate elevated off of beta blocker.    4. Acute renal failure/Hyperkalemia: He is being seen by Nephrology and now on CVVHD.     Chandrea Zellman  4/11/20168:39 AM

## 2014-04-14 NOTE — Progress Notes (Signed)
**Note De-Identified William Flores Obfuscation** Panic value on ABG reported to CCM

## 2014-04-14 NOTE — Progress Notes (Signed)
[facility] Progress Note Patient Name: William Flores DOB: [birth year] MRN: [id number]   Date of Service  04/14/2014  HPI/Events of Note  PT/INR = 32.0/3.08. Hx of chronic AFIB. ABG on 100%/PRVC 30/TV 500/P 10= 7.32/41.7/74/24.5.  eICU Interventions  Continue current management.      Intervention Category Intermediate Interventions: Diagnostic test evaluation  Lysle Dingwall 04/14/2014, 9:28 PM

## 2014-04-14 NOTE — Procedures (Signed)
Central Venous Catheter Insertion Procedure Note William Flores [id number] 02-24-1950  Procedure: Insertion of Central Venous Catheter Indications: Assessment of intravascular volume, Drug and/or fluid administration and Frequent blood sampling  Procedure Details Consent: Risks of procedure as well as the alternatives and risks of each were explained to the (patient/caregiver).  Consent for procedure obtained. Discussed with son on phone prior to procedure. Time Out: Verified patient identification, verified procedure, site/side was marked, verified correct patient position, special equipment/implants available, medications/allergies/relevent history reviewed, required imaging and test results available.  Performed  Maximum sterile technique was used including antiseptics, cap, gloves, gown, hand hygiene, mask and sheet. Skin prep: Chlorhexidine; local anesthetic administered A antimicrobial bonded/coated triple lumen catheter was placed in the left internal jugular vein using the Seldinger technique.  Ultrasound was used to verify the patency of the vein and for real time needle guidance.  Evaluation Blood flow good Complications: No apparent complications Patient did tolerate procedure well. Chest X-ray ordered to verify placement.  CXR: normal.  William Flores 04/14/2014, 12:17 AM

## 2014-04-14 NOTE — ED Notes (Signed)
MD McQuaid preparing to intubate patient.

## 2014-04-14 NOTE — Progress Notes (Signed)
Dr Deterding (Nephro) notified of renal function panel. New orders received.

## 2014-04-14 NOTE — Progress Notes (Signed)
Dr Nelda Marseille notified of INR level. New orders received.

## 2014-04-15 ENCOUNTER — Inpatient Hospital Stay (HOSPITAL_COMMUNITY): Payer: BLUE CROSS/BLUE SHIELD

## 2014-04-15 DIAGNOSIS — I9589 Other hypotension: Secondary | ICD-10-CM

## 2014-04-15 DIAGNOSIS — R579 Shock, unspecified: Secondary | ICD-10-CM

## 2014-04-15 DIAGNOSIS — J9601 Acute respiratory failure with hypoxia: Secondary | ICD-10-CM

## 2014-04-15 LAB — POCT I-STAT, CHEM 8
BUN: 42 mg/dL — AB (ref 6–23)
CHLORIDE: 91 mmol/L — AB (ref 96–112)
CREATININE: 3.1 mg/dL — AB (ref 0.50–1.35)
Calcium, Ion: 0.95 mmol/L — ABNORMAL LOW (ref 1.13–1.30)
GLUCOSE: 163 mg/dL — AB (ref 70–99)
HCT: 35 % — ABNORMAL LOW (ref 39.0–52.0)
HEMOGLOBIN: 11.9 g/dL — AB (ref 13.0–17.0)
Potassium: 4.8 mmol/L (ref 3.5–5.1)
SODIUM: 134 mmol/L — AB (ref 135–145)
TCO2: 25 mmol/L (ref 0–100)

## 2014-04-15 LAB — CBC WITH DIFFERENTIAL/PLATELET
BASOS ABS: 0 10*3/uL (ref 0.0–0.1)
Basophils Relative: 0 % (ref 0–1)
Eosinophils Absolute: 0 10*3/uL (ref 0.0–0.7)
Eosinophils Relative: 0 % (ref 0–5)
HEMATOCRIT: 30.8 % — AB (ref 39.0–52.0)
Hemoglobin: 8.8 g/dL — ABNORMAL LOW (ref 13.0–17.0)
LYMPHS ABS: 0.4 10*3/uL — AB (ref 0.7–4.0)
Lymphocytes Relative: 6 % — ABNORMAL LOW (ref 12–46)
MCH: 25.1 pg — AB (ref 26.0–34.0)
MCHC: 28.6 g/dL — AB (ref 30.0–36.0)
MCV: 87.7 fL (ref 78.0–100.0)
Monocytes Absolute: 0.4 10*3/uL (ref 0.1–1.0)
Monocytes Relative: 6 % (ref 3–12)
NEUTROS ABS: 6 10*3/uL (ref 1.7–7.7)
NEUTROS PCT: 88 % — AB (ref 43–77)
Platelets: 244 10*3/uL (ref 150–400)
RBC: 3.51 MIL/uL — ABNORMAL LOW (ref 4.22–5.81)
RDW: 23.8 % — AB (ref 11.5–15.5)
WBC: 6.8 10*3/uL (ref 4.0–10.5)

## 2014-04-15 LAB — PREPARE FRESH FROZEN PLASMA
UNIT DIVISION: 0
UNIT DIVISION: 0
Unit division: 0
Unit division: 0

## 2014-04-15 LAB — POCT I-STAT 3, ART BLOOD GAS (G3+)
ACID-BASE EXCESS: 4 mmol/L — AB (ref 0.0–2.0)
Acid-Base Excess: 8 mmol/L — ABNORMAL HIGH (ref 0.0–2.0)
Acid-Base Excess: 8 mmol/L — ABNORMAL HIGH (ref 0.0–2.0)
BICARBONATE: 27.6 meq/L — AB (ref 20.0–24.0)
Bicarbonate: 34 mEq/L — ABNORMAL HIGH (ref 20.0–24.0)
Bicarbonate: 35.4 mEq/L — ABNORMAL HIGH (ref 20.0–24.0)
O2 SAT: 96 %
O2 Saturation: 98 %
O2 Saturation: 99 %
PCO2 ART: 38.5 mmHg (ref 35.0–45.0)
PCO2 ART: 51 mmHg — AB (ref 35.0–45.0)
PH ART: 7.464 — AB (ref 7.350–7.450)
PH ART: 7.429 (ref 7.350–7.450)
PO2 ART: 100 mmHg (ref 80.0–100.0)
TCO2: 29 mmol/L (ref 0–100)
TCO2: 36 mmol/L (ref 0–100)
TCO2: 37 mmol/L (ref 0–100)
pCO2 arterial: 66 mmHg (ref 35.0–45.0)
pH, Arterial: 7.335 — ABNORMAL LOW (ref 7.350–7.450)
pO2, Arterial: 143 mmHg — ABNORMAL HIGH (ref 80.0–100.0)
pO2, Arterial: 90 mmHg (ref 80.0–100.0)

## 2014-04-15 LAB — PROCALCITONIN: Procalcitonin: 0.63 ng/mL

## 2014-04-15 LAB — RENAL FUNCTION PANEL
ALBUMIN: 2.8 g/dL — AB (ref 3.5–5.2)
ANION GAP: 18 — AB (ref 5–15)
Albumin: 2.5 g/dL — ABNORMAL LOW (ref 3.5–5.2)
Albumin: 2.9 g/dL — ABNORMAL LOW (ref 3.5–5.2)
Anion gap: 13 (ref 5–15)
Anion gap: 19 — ABNORMAL HIGH (ref 5–15)
BUN: 45 mg/dL — ABNORMAL HIGH (ref 6–23)
BUN: 32 mg/dL — ABNORMAL HIGH (ref 6–23)
BUN: 52 mg/dL — AB (ref 6–23)
CALCIUM: 7.9 mg/dL — AB (ref 8.4–10.5)
CHLORIDE: 91 mmol/L — AB (ref 96–112)
CO2: 27 mmol/L (ref 19–32)
CO2: 29 mmol/L (ref 19–32)
CO2: 31 mmol/L (ref 19–32)
CREATININE: 2.44 mg/dL — AB (ref 0.50–1.35)
Calcium: 7.7 mg/dL — ABNORMAL LOW (ref 8.4–10.5)
Calcium: 7.4 mg/dL — ABNORMAL LOW (ref 8.4–10.5)
Chloride: 89 mmol/L — ABNORMAL LOW (ref 96–112)
Chloride: 89 mmol/L — ABNORMAL LOW (ref 96–112)
Creatinine, Ser: 2.98 mg/dL — ABNORMAL HIGH (ref 0.50–1.35)
Creatinine, Ser: 3.67 mg/dL — ABNORMAL HIGH (ref 0.50–1.35)
GFR calc Af Amer: 19 mL/min — ABNORMAL LOW (ref >90)
GFR calc Af Amer: 24 mL/min — ABNORMAL LOW (ref >90)
GFR calc non Af Amer: 21 mL/min — ABNORMAL LOW (ref >90)
GFR calc non Af Amer: 27 mL/min — ABNORMAL LOW (ref >90)
GFR, EST AFRICAN AMERICAN: 31 mL/min — AB (ref >90)
GFR, EST NON AFRICAN AMERICAN: 16 mL/min — AB (ref >90)
GLUCOSE: 149 mg/dL — AB (ref 70–99)
Glucose, Bld: 136 mg/dL — ABNORMAL HIGH (ref 70–99)
Glucose, Bld: 153 mg/dL — ABNORMAL HIGH (ref 70–99)
POTASSIUM: 3.7 mmol/L (ref 3.5–5.1)
POTASSIUM: 4.4 mmol/L (ref 3.5–5.1)
POTASSIUM: 5.6 mmol/L — AB (ref 3.5–5.1)
Phosphorus: 3.7 mg/dL (ref 2.3–4.6)
Phosphorus: 4.2 mg/dL (ref 2.3–4.6)
Phosphorus: 5.6 mg/dL — ABNORMAL HIGH (ref 2.3–4.6)
SODIUM: 136 mmol/L (ref 135–145)
SODIUM: 135 mmol/L (ref 135–145)
Sodium: 135 mmol/L (ref 135–145)

## 2014-04-15 LAB — APTT: APTT: 53 s — AB (ref 24–37)

## 2014-04-15 LAB — GLUCOSE, CAPILLARY
GLUCOSE-CAPILLARY: 151 mg/dL — AB (ref 70–99)
Glucose-Capillary: 127 mg/dL — ABNORMAL HIGH (ref 70–99)
Glucose-Capillary: 123 mg/dL — ABNORMAL HIGH (ref 70–99)
Glucose-Capillary: 134 mg/dL — ABNORMAL HIGH (ref 70–99)
Glucose-Capillary: 137 mg/dL — ABNORMAL HIGH (ref 70–99)

## 2014-04-15 LAB — CARBOXYHEMOGLOBIN
Carboxyhemoglobin: 1.9 % — ABNORMAL HIGH (ref 0.5–1.5)
Methemoglobin: 0.8 % (ref 0.0–1.5)
O2 SAT: 72.8 %
TOTAL HEMOGLOBIN: 8.7 g/dL — AB (ref 13.5–18.0)

## 2014-04-15 LAB — PROTIME-INR
INR: 2.39 — ABNORMAL HIGH (ref 0.00–1.49)
Prothrombin Time: 26.3 seconds — ABNORMAL HIGH (ref 11.6–15.2)

## 2014-04-15 MED ORDER — VITAL HIGH PROTEIN PO LIQD
1000.0000 mL | ORAL | Status: DC
  Administered 2014-04-15: 1000 mL
  Administered 2014-04-15 – 2014-04-16 (×2)
  Filled 2014-04-15 (×3): qty 1000

## 2014-04-15 MED ORDER — SODIUM CHLORIDE 0.9 % IJ SOLN: 10.0000 mL | INTRAMUSCULAR | Status: DC | PRN

## 2014-04-15 MED ORDER — FAMOTIDINE IN NACL 20-0.9 MG/50ML-% IV SOLN
20.0000 mg | INTRAVENOUS | Status: DC
  Administered 2014-04-16 – 2014-04-17 (×2): 20 mg via INTRAVENOUS
  Filled 2014-04-15 (×3): qty 50

## 2014-04-15 MED ORDER — SODIUM CHLORIDE 0.9 % IJ SOLN
10.0000 mL | Freq: Two times a day (BID) | INTRAMUSCULAR | Status: DC
Start: 2014-04-15 — End: 2014-04-19
  Administered 2014-04-15 – 2014-04-16 (×3): 10 mL
  Administered 2014-04-17: 20 mL
  Administered 2014-04-17: 30 mL
  Administered 2014-04-18 (×2): 10 mL
  Administered 2014-04-19: 20 mL

## 2014-04-15 MED ORDER — CLOTRIMAZOLE 1 % EX CREA
TOPICAL_CREAM | Freq: Two times a day (BID) | CUTANEOUS | Status: DC
  Administered 2014-04-15: 22:00:00 via TOPICAL
  Administered 2014-04-15: 1 via TOPICAL
  Administered 2014-04-16 (×2): via TOPICAL
  Administered 2014-04-17: 1 via TOPICAL
  Administered 2014-04-17: 09:00:00 via TOPICAL
  Administered 2014-04-18: 1 via TOPICAL
  Administered 2014-04-18 – 2014-04-23 (×4): via TOPICAL
  Filled 2014-04-15 (×3): qty 15

## 2014-04-15 MED ORDER — HEPARIN SODIUM (PORCINE) 1000 UNIT/ML DIALYSIS
1000.0000 [IU] | INTRAMUSCULAR | Status: DC | PRN
  Administered 2014-04-17: 1200 [IU] via INTRAVENOUS_CENTRAL
  Filled 2014-04-15 (×2): qty 6

## 2014-04-15 MED ORDER — PRISMASOL BGK 4/2.5 32-4-2.5 MEQ/L IV SOLN
INTRAVENOUS | Status: DC
  Administered 2014-04-15 – 2014-04-17 (×19): via INTRAVENOUS_CENTRAL
  Filled 2014-04-15 (×42): qty 5000

## 2014-04-15 MED ORDER — PRISMASOL BGK 4/2.5 32-4-2.5 MEQ/L IV SOLN
INTRAVENOUS | Status: DC
  Administered 2014-04-15 – 2014-04-16 (×2): via INTRAVENOUS_CENTRAL
  Filled 2014-04-15 (×6): qty 5000

## 2014-04-15 MED ORDER — PRO-STAT SUGAR FREE PO LIQD
30.0000 mL | Freq: Every day | ORAL | Status: DC
  Administered 2014-04-15 – 2014-04-16 (×6): 30 mL
  Filled 2014-04-15 (×15): qty 30

## 2014-04-15 NOTE — Progress Notes (Signed)
Dr.Summer with [facility] made aware of ABG's and PT/INR results. The INR results states this is from 1600. Labs for INR was sent for 2000 and lab department stated they would correct this.

## 2014-04-15 NOTE — Progress Notes (Signed)
NUTRITION FOLLOW-UP  DOCUMENTATION CODES Per approved criteria  -Morbid Obesity   INTERVENTION:  Initiate Vital High Protein @ 20 ml/hr via OG tube and increase by 10 ml every 4 hours to goal rate of 40 ml/hr.  30 ml Prostat five times per day.   Tube feeding regimen provides 1460 kcal (12 kcal/kg of ABW), 159 grams of protein, and 802 ml of H2O.   NUTRITION DIAGNOSIS: Inadequate oral intake related to inability to eat as evidenced by NPO status; ongoing.   Goal: Enteral nutrition to provide 65-70% of estimated calorie needs based on ASPEN guidelines for hypocaloric, high protein feeding in critically ill obese individuals; not met.   Monitor:  Respiratory status, weight trends, labs, TF tolerance/adequacy, I&O's  ASSESSMENT: Pt with hx of CHF, DM, OSA admitted with increased dyspnea and hypotension.  Pt intubated and started CRRT 4/11 for AKI.   Patient is currently intubated on ventilator support MV: 6.8 L/min Temp (24hrs), Avg:96.5 F (35.8 C), Min:95.2 F (35.1 C), Max:97.9 F (36.6 C)  Labs reviewed:  BUN/Cr elevated  CBG's:127-151  Spoke with MD and RN, pt is more stable this am, pressors decreasing and now ready to start enteral feedings. Per MD likely extubation 4/13.  MAP: 76, 82, 76, 66  Height: Ht Readings from Last 1 Encounters:  04/14/14 5' 7"  (1.702 m)    Weight: Wt Readings from Last 1 Encounters:  04/15/14 300 lb 11.3 oz (136.4 kg)  Usual weight: 261 lb (118.7 kg) 02/2014   BMI:  Body mass index is 47.09 kg/(m^2).  Estimated Nutritional Needs: Kcal: 6384-6659 Protein: >/= 153 grams Fluid: > 1.5 L/day  Skin: ecchymosis abd, excoriated groin  Diet Order: Diet NPO time specified   Intake/Output Summary (Last 24 hours) at 04/15/14 0949 Last data filed at 04/15/14 0910  Gross per 24 hour  Intake 3106.92 ml  Output   2242 ml  Net 864.92 ml    Last BM: 4/11   Labs:   Recent Labs Lab 04/14/14 0708 04/14/14 1711 04/14/14 2330  04/15/14 0141 04/15/14 0445  NA  --  135 135 134* 136  K  --  5.6* 5.6* 4.8 4.4  CL  --  94* 89* 91* 89*  CO2  --  21 27  --  29  BUN  --  61* 52* 42* 45*  CREATININE  --  4.21* 3.67* 3.10* 2.98*  CALCIUM  --  8.0* 7.9*  --  7.7*  MG 2.0  --   --   --   --   PHOS  --  4.9* 4.2  --  3.7  GLUCOSE  --  98 153* 163* 149*    CBG (last 3)   Recent Labs  04/14/14 2336 04/15/14 0417 04/15/14 0815  GLUCAP 159* 151* 127*    Scheduled Meds: . sodium chloride   Intravenous Once  . antiseptic oral rinse  7 mL Mouth Rinse QID  . ceFEPime (MAXIPIME) IV  2 g Intravenous Q12H  . chlorhexidine  15 mL Mouth Rinse BID  . clotrimazole   Topical BID  . famotidine (PEPCID) IV  20 mg Intravenous Q12H  . hydrocortisone sodium succinate  50 mg Intravenous Q6H  . insulin aspart  1-3 Units Subcutaneous 6 times per day  . insulin glargine  10 Units Subcutaneous Q24H  . vancomycin  1,250 mg Intravenous Q24H    Continuous Infusions: . sodium chloride Stopped (04/15/14 0700)  . DOPamine 2 mcg/kg/min (04/15/14 0910)  . epinephrine Stopped (04/14/14 1311)  .  fentaNYL infusion INTRAVENOUS 75 mcg/hr (04/15/14 0700)  . norepinephrine (LEVOPHED) Adult infusion 3 mcg/min (04/15/14 0910)  . dialysis replacement fluid (prismasate) 500 mL/hr at 04/15/14 0843  . dialysate (PRISMASATE) 2,000 mL/hr at 04/15/14 0938  . sodium bicarbonate (isotonic) 1000 mL infusion 200 mL/hr at 04/15/14 0843  . vasopressin (PITRESSIN) infusion - *FOR SHOCK* Stopped (04/15/14 0727)    [place], [place], CNSC [phone] Pager [phone] After Hours Pager

## 2014-04-15 NOTE — Consult Note (Signed)
ANTICOAGULATION CONSULT NOTE - Initial Consult  Pharmacy Consult for Heparin when INR<2 Indication: atrial fibrillation  Allergies  Allergen Reactions  . Codeine     Headache     Patient Measurements: Height: 5' 7"  (170.2 cm) Weight: (!) 300 lb 11.3 oz (136.4 kg) IBW/kg (Calculated) : 66.1 Heparin Dosing Weight: 98.8kg  Vital Signs: Temp: 97.3 F (36.3 C) (04/12 1200) Temp Source: Core (Comment) (04/12 0400) Pulse Rate: 90 (04/12 1200)  Labs:  Recent Labs  04/13/14 2140  04/14/14 0221  04/14/14 0351  04/14/14 0708 04/14/14 1308 04/14/14 1602  04/14/14 2330 04/15/14 0141 04/15/14 0445  HGB 9.8*  < > 9.0*  --   --   --   --   --   --   --   --  11.9* 8.8*  HCT 36.6*  < > 33.8*  --   --   --   --   --   --   --   --  35.0* 30.8*  PLT 471*  --  461*  --   --   --   --   --   --   --   --   --  244  APTT  --   --   --   --   --   --  71*  --   --   --   --   --  53*  LABPROT 53.6*  --   --   --  59.0*  --   --   --  32.0*  --   --   --  26.3*  INR 5.96*  --   --   --  6.73*  --   --   --  3.08*  --   --   --  2.39*  CREATININE 5.66*  < > 5.64*  < >  --   < >  --   --   --   < > 3.67* 3.10* 2.98*  TROPONINI  --   --  <0.03  --   --   --  0.03 0.22*  --   --   --   --   --   < > = values in this interval not displayed.  Estimated Creatinine Clearance: 34.2 mL/min (by C-G formula based on Cr of 2.98).   Medical History: Past Medical History  Diagnosis Date  . Diabetes   . Colon cancer   . Hypertension   . Sleep apnea   . Atrial fibrillation   . Hyperlipidemia   . CHF (congestive heart failure)   . Colon polyps 09/21/2012    Tubular adenoma    Medications:  Scheduled:  . sodium chloride   Intravenous Once  . antiseptic oral rinse  7 mL Mouth Rinse QID  . ceFEPime (MAXIPIME) IV  2 g Intravenous Q12H  . chlorhexidine  15 mL Mouth Rinse BID  . clotrimazole   Topical BID  . [START ON 04/16/2014] famotidine (PEPCID) IV  20 mg Intravenous Q24H  . feeding  supplement (PRO-STAT SUGAR FREE 64)  30 mL Per Tube 5 X Daily  . feeding supplement (VITAL HIGH PROTEIN)  1,000 mL Per Tube Q24H  . hydrocortisone sodium succinate  50 mg Intravenous Q6H  . insulin aspart  1-3 Units Subcutaneous 6 times per day  . insulin glargine  10 Units Subcutaneous Q24H  . vancomycin  1,250 mg Intravenous Q24H    Assessment: 64yoM  c/o SOB and weakness x3wk, now w/ diarrhea  for past 3d, found to be hypotensive, concern for digoxin toxicity and given Digibind; CCM admited w/ cardiogenic shock, ARF, bradycardia, hyperkalemia, severe metabolic acidosis, respiratory failure w/ possible PNA. Patient with history of atrial fibrillation, on chronic warfarin PTA. INR supratherapeutic on admission at 5.96; warfarin held. INR today 2.39 s/p 57m VitK and FFP. Pharmacy consulted to begin heparin when INR<2. Hgb 8.8, plts 244 - no bleeding noted.  Goal of Therapy:  Heparin level 0.3-0.7 units/ml Monitor platelets by anticoagulation protocol: Yes   Plan:  Hold off on starting heparin as INR is >2 Continue holding warfarin Daily INR/PT Daily HL/CBC once heparin is started Monitor s/sx of bleeding  Thank you for allowing pharmacy to be part of this patient's care team  C[place] Pharm.D Clinical Pharmacy Resident Pager: [phone]/12/2014 .1:55 PM

## 2014-04-15 NOTE — Progress Notes (Signed)
**Note De-Identified Asianae Minkler Obfuscation** Panic value of ABG reported to RN; patient returned to full settings on ventilator.  RRT to continue to monitor.

## 2014-04-15 NOTE — Progress Notes (Signed)
PULMONARY / CRITICAL CARE MEDICINE   Name: William Flores MRN: [id number] DOB: 1950/05/05    ADMISSION DATE:  04/13/2014 CONSULTATION DATE:  04/13/14  REFERRING MD :  Ashok Cordia  CHIEF COMPLAINT:  Shortness of breath  INITIAL PRESENTATION: 64 y/o male came to the [facility] ED on 4/10 by EMS for "low oxygen and trouble breathing".  He was found to be in cardiogenic shock, bradycardic, in acute renal failure, hyperkalemic, and with lactic acidosis.   STUDIES:  4/11 KUB: non-obstructive gas pattern 4/11 Echo: EF 65-70%, no WMA, RV and RA severely dilated, PA pressure 33mHg  SIGNIFICANT EVENTS: 4/11: cardiogenic shock, multiple pressors, intubated 4/12: weaning off pressors   HISTORY OF PRESENT ILLNESS:  64[facility]low oxygen and trouble breathing".  He was found to be in cardiogenic shock, bradycardic, in acute renal failure, hyperkalemic, and in a severe metabolic acidosis. There was no family to provide history and the patient was encephalopathic so no further history could be obtained.   SUBJECTIVE: pt following commands this AM.   VITAL SIGNS: Temp:  [95.2 F (35.1 C)-97.9 F (36.6 C)] 97.3 F (36.3 C) (04/12 0600) Pulse Rate:  [49-109] 86 (04/12 0600) Resp:  [0-31] 30 (04/12 0600) BP: (78)/(35) 78/35 mmHg (04/11 0920) SpO2:  [90 %-100 %] 98 % (04/12 0600) FiO2 (%):  [100 %] 100 % (04/12 0600) Weight:  [300 lb 11.3 oz (136.4 kg)] 300 lb 11.3 oz (136.4 kg) (04/12 0500) HEMODYNAMICS: CVP:  [15 mmHg-18 mmHg] 18 mmHg VENTILATOR SETTINGS: Vent Mode:  [-] PRVC FiO2 (%):  [100 %] 100 % Set Rate:  [30 bmp] 30 bmp Vt Set:  [500 mL] 500 mL PEEP:  [10 cmH20] 10 cmH20 Plateau Pressure:  [24 cmH20-27 cmH20] 27 cmH20 INTAKE / OUTPUT:  Intake/Output Summary (Last 24 hours) at 04/15/14 0714 Last data filed at 04/15/14 0700  Gross per 24 hour  Intake 3167.11 ml  Output   2621 ml  Net 546.11 ml    PHYSICAL EXAMINATION: General: resting in bed,  responds to commands HEENT: PERRL, EOMI, no scleral icterus Cardiac: irregularly irregular, no rubs, murmurs or gallops Pulm: slightly crackles in bases, moving normal volumes of air Abd: soft, nontender, nondistended, BS present Ext: warm and well perfused, no pedal edema, groin folds with erythematous plaque and satellite lesions Neuro: alert  LABS:  CBC  Recent Labs Lab 04/13/14 2140  04/14/14 0221 04/15/14 0141 04/15/14 0445  WBC 12.5*  --  11.8*  --  6.8  HGB 9.8*  < > 9.0* 11.9* 8.8*  HCT 36.6*  < > 33.8* 35.0* 30.8*  PLT 471*  --  461*  --  244  < > = values in this interval not displayed. Coag's  Recent Labs Lab 04/14/14 0351 04/14/14 0708 04/14/14 1602 04/15/14 0445  APTT  --  71*  --  53*  INR 6.73*  --  3.08* 2.39*   BMET  Recent Labs Lab 04/14/14 1711 04/14/14 2330 04/15/14 0141 04/15/14 0445  NA 135 135 134* 136  K 5.6* 5.6* 4.8 4.4  CL 94* 89* 91* 89*  CO2 21 27  --  29  BUN 61* 52* 42* 45*  CREATININE 4.21* 3.67* 3.10* 2.98*  GLUCOSE 98 153* 163* 149*   Electrolytes  Recent Labs Lab 04/14/14 0708 04/14/14 1711 04/14/14 2330 04/15/14 0445  CALCIUM  --  8.0* 7.9* 7.7*  MG 2.0  --   --   --  PHOS  --  4.9* 4.2 3.7   Sepsis Markers  Recent Labs Lab 04/14/14 0213 04/14/14 0221 04/14/14 0710 04/14/14 1310 04/15/14 0445  LATICACIDVEN 9.6*  --  11.6* 10.1*  --   PROCALCITON  --  0.11  --   --  0.63   ABG  Recent Labs Lab 04/14/14 1854 04/14/14 2031 04/15/14 0123  PHART 7.380 7.377 7.464*  PCO2ART 40.8 41.7 38.5  PO2ART 63.0* 74.0* 100.0   Liver Enzymes  Recent Labs Lab 04/13/14 2140  04/14/14 1711 04/14/14 2330 04/15/14 0445  AST 26  --   --   --   --   ALT 20  --   --   --   --   ALKPHOS 69  --   --   --   --   BILITOT 0.6  --   --   --   --   ALBUMIN 3.1*  < > 3.1* 2.9* 2.8*  < > = values in this interval not displayed. Cardiac Enzymes  Recent Labs Lab 04/14/14 0221 04/14/14 0708 04/14/14 1308   TROPONINI <0.03 0.03 0.22*   Glucose  Recent Labs Lab 04/14/14 1805 04/14/14 1908 04/14/14 1954 04/14/14 2113 04/14/14 2336 04/15/14 0417  GLUCAP 100* 119* 130* 132* 159* 151*    Imaging Dg Chest Port 1 View  04/14/2014   CLINICAL DATA:  Central line placement.  EXAM: PORTABLE CHEST - 1 VIEW  COMPARISON:  Chest x-ray from the same day at 00:38  FINDINGS: New right IJ central line, tip near the right IJ/ brachiocephalic confluence. A left IJ central line is in similar position, tip at the upper SVC. The endotracheal tube and orogastric tube remains in good position.  Unchanged cardiomegaly and upper mediastinal widening. There is diffuse interstitial and airspace opacity consistent with pulmonary edema. Layering pleural effusions are likely. No pneumothorax.  IMPRESSION: 1. New right neck central line, tip near the right IJ/ brachiocephalic vein confluence. No pneumothorax. 2. Pre-existing hardware is in unchanged position. 3. Unchanged pulmonary edema.   Electronically Signed   By: Monte Fantasia M.D.   On: 04/14/2014 05:32   Dg Chest Portable 1 View  04/14/2014   CLINICAL DATA:  Endotracheal tube placement.  EXAM: PORTABLE CHEST - 1 VIEW  COMPARISON:  Earlier this day at 0008 hr  FINDINGS: Endotracheal tube 3.3 cm from the carina. Enteric tube in place, mid and distal portions not well seen, tip obscure due to multiple overlying artifacts. Tip of the left central line in the SVC. Cardiomegaly is grossly stable. Bilateral perihilar opacities, minimally improved in the right lung. Bilateral pleural effusions are unchanged.  IMPRESSION: 1. Endotracheal tube 3.3 cm from the carina. 2. Enteric tube in place, however not well visualized beyond the mid distal esophagus due to overlying monitoring devices.   Electronically Signed   By: Jeb Levering M.D.   On: 04/14/2014 01:56   Dg Chest Portable 1 View  04/14/2014   CLINICAL DATA:  Shortness of breath.  Central line placement  EXAM: PORTABLE  CHEST - 1 VIEW  COMPARISON:  04/13/2014  FINDINGS: New left IJ catheter, tip at the upper SVC. No indication of pneumothorax.  Progressive bilateral lung opacification. There are layering pleural effusions. Stable cardiopericardial enlargement and vascular pedicle widening.  IMPRESSION: 1. New left IJ catheter is in good position. No visible pneumothorax. 2. Progressive CHF.   Electronically Signed   By: Monte Fantasia M.D.   On: 04/14/2014 00:58   Dg Abd Portable 1v  04/14/2014   CLINICAL DATA:  Metabolic acidosis.  EXAM: PORTABLE ABDOMEN - 1 VIEW  COMPARISON:  02/19/2014  FINDINGS: There is a paucity of gas throughout the abdomen. No convincing evidence for bowel obstruction. NG tube is in the stomach. No free air on this portable supine study. No visible organomegaly or suspicious calcification.  IMPRESSION: Nonspecific bowel gas pattern with relative paucity of gas. No evidence of obstruction.   Electronically Signed   By: Rolm Baptise M.D.   On: 04/14/2014 09:19     ASSESSMENT / PLAN:  PULMONARY OETT 4/11 >> A: Acute respiratory failure due to acute pulmonary edema from acute cardiogenic shock, ddx includes HCAP but this seems less likely P:   -see ID -see cardiology -SBT -high PEEP strategy given morbid obesity, high transpulmonary pressure  CARDIOVASCULAR CVL L IJ 4/11 >> A: Acute circulatory shock, ddx includes sepsis but no clear source Acute systolic heart failure Bradycardia due to metabolic acidosis > resolved Chronic atrial fibriliation on warfarin  P:  -cardiology consult with heartfailure team -hold home BP medications (metoprolol, lisinopril) -pt on levo, dopamine -CVP -coox -serial lactic acid -lasix gtt d/c   RENAL A:  Acute kidney injury, presumably due to lack of forward flow Metabolic acidosis> lactic acidosis due to shock Hyperkalemia P:   -consult renal, appreciate recs pt on CVVHD  GASTROINTESTINAL A:  Diarrhea P:   -monitor stool output -OG  tube -tube feedings for nutrition -pepcid for SUP  HEMATOLOGIC A:  Coagulopathy due to warfarin, INR >5 on admit P:  -correct INR with VitK -monitor for bleeding -hold warfarin  INFECTIOUS A:  Difficult clinical picture for identifiying preceding event. Will continue to empirically treat for HCAP.  Pt has candidial intertrigo   P:   BCx2 4/11  Sputum 4/11  Abx: vanc, start date 4/11 > Abx Cefepime, start date 4/11 >  Topical clotrimazole for groin bilaterally BID  ENDOCRINE A:  DM2 P:   -SSI q4  NEUROLOGIC A:  Acute encephalopathy due to hypoxemia, heart failure, hypercapnea  P:   RASS goal: -1 Fentanyl gtt   FAMILY  - Updates: Son [name] updated by me on phone on 4/10.  I advised the severity of this illness and clarified code status. I explained to him the severity of this acute illness and that his father's prognosis is poor given his overall poor health and multiple comorbid illnesses.  Summary: Will begin SBT given significant improvement   Clinton Gallant, MD IM PGY-3 Pgr: [phone]   Attending:  I have seen and examined the patient with nurse practitioner/resident and agree with the note above.   On my my exam Mr. Flannagan is awake, extremities well perfused and warm today, lungs with a few crackles.  I put him on pressure support ventilation and he did remarkably well.  However his high PEEP and FiO2 preclude extubation today.  1) Acute respiratory failure with hypoxemia> presumably acute pulmonary edema, ddx includes HCAP but no secretions or fever to suggest this; agree with volume removal today, wean peep, RR, fio2 2) AKI: continue CVVHD with volume removal.  I agree with Dr. Aundra Dubin that metabolic acidosis could be the primary cause of his acute decompensation, without a clear history it's not clear to me why this happened, diarrhea? 3) Shock: dramatically improved, presumably due to acidosis, echo normal. Does not appear to be cardiogenic given echo  findings 4) Sepsis? Again I'm not certain that he is septic but his CXR is quite a bit worse so  we'll continue HCAP coverage for another day 5) nutrition> tube feedings today  My cc time 35 minutes  Roselie Awkward, MD [facility] Pager: [phone] Cell: [phone] If no response, call [phone]

## 2014-04-15 NOTE — Progress Notes (Signed)
Advanced Heart Failure Rounding Note   Subjective:   [name] is a 64 year old with history of chronic combined diastolic/systolic heart failure, DMII, OSA - CPAP, obesity, and chronic A-fib - was on coumadin.   Admitted with increased dyspnea and found to be hypotensive - SBP 50s and bradycardic- HR 40s. Lactic Acid was 5.6, creatinine 5.6, INR 5.96, and K 6.8. Received insulin, calcium and glucose. Due to bradycardia.  Given digi bind  -->10 vials. He received bicarb and IV fluids. He continued to decline and required intubation. CXR with pulmonary edema. Nephrology consulted and started on CVVHD earlier this am.   Remains intubated. Now on dopa 3 mcg, nor epi 3 mcg  and vasopressin 0.03 units.  Urine output improved but weight trending up. Poor urine output persistent hypotension. SBP 60-70s.   CO-OX 98%  Pro calcitonin 0.63  Objective:   Weight Range:  Vital Signs:   Temp:  [95.2 F (35.1 C)-97.9 F (36.6 C)] 97.3 F (36.3 C) (04/12 0600) Pulse Rate:  [49-109] 86 (04/12 0600) Resp:  [0-31] 30 (04/12 0600) BP: (78)/(35) 78/35 mmHg (04/11 0920) SpO2:  [90 %-100 %] 98 % (04/12 0600) FiO2 (%):  [100 %] 100 % (04/12 0600) Weight:  [300 lb 11.3 oz (136.4 kg)] 300 lb 11.3 oz (136.4 kg) (04/12 0500) Last BM Date: 04/14/14  Weight change: Filed Weights   04/14/14 0106 04/14/14 0300 04/15/14 0500  Weight: 268 lb 1.3 oz (121.6 kg) 298 lb 8.1 oz (135.4 kg) 300 lb 11.3 oz (136.4 kg)    Intake/Output:   Intake/Output Summary (Last 24 hours) at 04/15/14 0724 Last data filed at 04/15/14 0700  Gross per 24 hour  Intake 3167.11 ml  Output   2621 ml  Net 546.11 ml     Physical Exam: CVP 18   General:: Intubated. No resp difficulty HEENT: normal Neck: supple. JVP difficult to assess due body habitus.  Carotids 2+ bilat; no bruits. No lymphadenopathy or thryomegaly appreciated. LIJ . R HD catheter.  Cor: PMI nondisplaced. Regular rate & rhythm. No rubs, gallops or murmurs. Lungs:  clear Abdomen: obese, soft, nontender, distended. No hepatosplenomegaly. No bruits or masses. Good bowel sounds. Extremities: no cyanosis, clubbing, rash,  1+  RLE and LLE. Bilaters SCDs.  Neuro: Intubated.  GU: Foley in place   Telemetry: Sinus Tach 90-100s   Labs: Basic Metabolic Panel:  Recent Labs Lab 04/14/14 0225 04/14/14 0500 04/14/14 0708 04/14/14 1711 04/14/14 2330 04/15/14 0141 04/15/14 0445  NA 138 138  --  135 135 134* 136  K 5.3* 4.9  --  5.6* 5.6* 4.8 4.4  CL 103 101  --  94* 89* 91* 89*  CO2 18* 13*  --  21 27  --  29  GLUCOSE 255* 307*  --  98 153* 163* 149*  BUN 85* 74*  --  61* 52* 42* 45*  CREATININE 5.62* 5.10*  --  4.21* 3.67* 3.10* 2.98*  CALCIUM 8.8 8.5  --  8.0* 7.9*  --  7.7*  MG  --   --  2.0  --   --   --   --   PHOS  --  6.8*  --  4.9* 4.2  --  3.7    Liver Function Tests:  Recent Labs Lab 04/13/14 2140 04/14/14 0500 04/14/14 1711 04/14/14 2330 04/15/14 0445  AST 26  --   --   --   --   ALT 20  --   --   --   --  ALKPHOS 69  --   --   --   --   BILITOT 0.6  --   --   --   --   PROT 5.8*  --   --   --   --   ALBUMIN 3.1* 3.0* 3.1* 2.9* 2.8*    Recent Labs Lab 04/14/14 0221  LIPASE 33  AMYLASE 57   No results for input(s): AMMONIA in the last 168 hours.  CBC:  Recent Labs Lab 04/13/14 2140 04/13/14 2152 04/13/14 2323 04/14/14 0221 04/15/14 0141 04/15/14 0445  WBC 12.5*  --   --  11.8*  --  6.8  NEUTROABS 9.2*  --   --   --   --  6.0  HGB 9.8* 12.9* 10.5* 9.0* 11.9* 8.8*  HCT 36.6* 38.0* 31.0* 33.8* 35.0* 30.8*  MCV 93.6  --   --  94.7  --  87.7  PLT 471*  --   --  461*  --  244    Cardiac Enzymes:  Recent Labs Lab 04/14/14 0221 04/14/14 0708 04/14/14 1308  TROPONINI <0.03 0.03 0.22*    BNP: BNP (last 3 results)  Recent Labs  02/15/14 0350 04/13/14 2140 04/14/14 0222  BNP 75.9 467.9* 333.2*    ProBNP (last 3 results)  Recent Labs  08/05/13 1403 10/28/13 1746  PROBNP 376.6* 325.7*       Other results:  Imaging: Dg Chest Port 1 View  04/14/2014   CLINICAL DATA:  Central line placement.  EXAM: PORTABLE CHEST - 1 VIEW  COMPARISON:  Chest x-ray from the same day at 00:38  FINDINGS: New right IJ central line, tip near the right IJ/ brachiocephalic confluence. A left IJ central line is in similar position, tip at the upper SVC. The endotracheal tube and orogastric tube remains in good position.  Unchanged cardiomegaly and upper mediastinal widening. There is diffuse interstitial and airspace opacity consistent with pulmonary edema. Layering pleural effusions are likely. No pneumothorax.  IMPRESSION: 1. New right neck central line, tip near the right IJ/ brachiocephalic vein confluence. No pneumothorax. 2. Pre-existing hardware is in unchanged position. 3. Unchanged pulmonary edema.   Electronically Signed   By: Monte Fantasia M.D.   On: 04/14/2014 05:32   Dg Chest Portable 1 View  04/14/2014   CLINICAL DATA:  Endotracheal tube placement.  EXAM: PORTABLE CHEST - 1 VIEW  COMPARISON:  Earlier this day at 0008 hr  FINDINGS: Endotracheal tube 3.3 cm from the carina. Enteric tube in place, mid and distal portions not well seen, tip obscure due to multiple overlying artifacts. Tip of the left central line in the SVC. Cardiomegaly is grossly stable. Bilateral perihilar opacities, minimally improved in the right lung. Bilateral pleural effusions are unchanged.  IMPRESSION: 1. Endotracheal tube 3.3 cm from the carina. 2. Enteric tube in place, however not well visualized beyond the mid distal esophagus due to overlying monitoring devices.   Electronically Signed   By: Jeb Levering M.D.   On: 04/14/2014 01:56   Dg Chest Portable 1 View  04/14/2014   CLINICAL DATA:  Shortness of breath.  Central line placement  EXAM: PORTABLE CHEST - 1 VIEW  COMPARISON:  04/13/2014  FINDINGS: New left IJ catheter, tip at the upper SVC. No indication of pneumothorax.  Progressive bilateral lung opacification.  There are layering pleural effusions. Stable cardiopericardial enlargement and vascular pedicle widening.  IMPRESSION: 1. New left IJ catheter is in good position. No visible pneumothorax. 2. Progressive CHF.   Electronically Signed  By: Monte Fantasia M.D.   On: 04/14/2014 00:58   Dg Chest Portable 1 View  04/13/2014   CLINICAL DATA:  Shortness of breath and bradycardia  EXAM: PORTABLE CHEST - 1 VIEW  COMPARISON:  02/20/2014  FINDINGS: Chronic cardiomegaly, with size accentuated by increase in lower mediastinal fat on recent CT. There is widening of the vascular pedicle which is also also stable.  Haziness of the lower chest compatible with pleural effusions and atelectasis. There is pulmonary venous congestion and probable basilar alveolar edema.  IMPRESSION: CHF pattern.   Electronically Signed   By: Monte Fantasia M.D.   On: 04/13/2014 22:15   Dg Abd Portable 1v  04/14/2014   CLINICAL DATA:  Metabolic acidosis.  EXAM: PORTABLE ABDOMEN - 1 VIEW  COMPARISON:  02/19/2014  FINDINGS: There is a paucity of gas throughout the abdomen. No convincing evidence for bowel obstruction. NG tube is in the stomach. No free air on this portable supine study. No visible organomegaly or suspicious calcification.  IMPRESSION: Nonspecific bowel gas pattern with relative paucity of gas. No evidence of obstruction.   Electronically Signed   By: Rolm Baptise M.D.   On: 04/14/2014 09:19     Medications:     Scheduled Medications: . sodium chloride   Intravenous Once  . antiseptic oral rinse  7 mL Mouth Rinse QID  . ceFEPime (MAXIPIME) IV  2 g Intravenous Q12H  . chlorhexidine  15 mL Mouth Rinse BID  . famotidine (PEPCID) IV  20 mg Intravenous Q12H  . hydrocortisone sodium succinate  50 mg Intravenous Q6H  . insulin aspart  1-3 Units Subcutaneous 6 times per day  . insulin glargine  10 Units Subcutaneous Q24H  . vancomycin  1,250 mg Intravenous Q24H    Infusions: . sodium chloride 10 mL/hr (04/14/14 1900)  .  DOPamine 3 mcg/kg/min (04/15/14 0656)  . epinephrine Stopped (04/14/14 1311)  . fentaNYL infusion INTRAVENOUS 75 mcg/hr (04/15/14 0434)  . norepinephrine (LEVOPHED) Adult infusion 3 mcg/min (04/15/14 0655)  . dialysis replacement fluid (prismasate) 800 mL/hr at 04/15/14 0443  . dialysate (PRISMASATE) 2,000 mL/hr at 04/15/14 0619  . sodium bicarbonate (isotonic) 1000 mL infusion 500 mL/hr at 04/15/14 0602  . vasopressin (PITRESSIN) infusion - *FOR SHOCK* 0.03 Units/min (04/15/14 0430)    PRN Medications: sodium chloride, fentaNYL, heparin, heparin, sodium chloride   Assessment/Plan   1.Acute Respiratory Failure- on vent per PCCM 2. A/C Systolic Heart Failure- History of possible tachy-mediated CMP, EF 65-70%. CO-OX today 98%.  BP better today. Remains on dopamine at 3 mcg, levo 3 mcg, and vasopressin .03 units.  3,. AKI- Continues on CVVHD. Weight continues to trend up.  4. Coagulopathy- INR on admit 5.96 coumadin on hold. Last night had 10 mg Vitamin K. INR today 2.39 5. ? Infection- on Vanc and Zosyn. Blood cultures --NGTD.   6. DM II. - on SSI every 4 hours 7. Atrial fibrillation: Chronic, has been on warfarin at home. Coumadin on hold as above.   Length of Stay: 1  CLEGG,AMY NP-C  04/15/2014, 7:24 AM  Advanced Heart Failure Team Pager [phone] (M-F; [place])  Please contact [facility] for night-coverage after hours (4p -7a ) and weekends on [email or web address]  See my note from today (separate).   Loralie Champagne 04/15/2014 7:37 AM

## 2014-04-15 NOTE — Progress Notes (Signed)
Dr.Deterding with [facility] was called the results of earlier BMP, made her aware that BMP that was ordered earlier may not have been best reflection based on that I did not receive the latest change in order for CVVH primasol bags an hour later after Dr.Deterding (renal) had changed the orders. Ran a i-stat of BMP and ABG's with results given to [facility] Dr.Deterding..Will obtain labs in morning.

## 2014-04-15 NOTE — Progress Notes (Signed)
**Note De-Identified William Flores Obfuscation** Dr. [place] Bells placed patient on CP/PS of 10/8 with a FIO2 of 80%.  MD communicated with RRT to cont. To wean as tolerated.  Titrate FIO2 as tolerated.  RRT will collect ABG in 1 hour and monitor patient.

## 2014-04-15 NOTE — Progress Notes (Signed)
Assessment: 1. Acute kidney injury - suspect d/t shock 2. Severe combined resp/ metabolic acidosis 3. Shock , likely cardiogenic, less likely septic 4. Acute resp failure / bilat pulm edema 5. Hyperkalemia 6. Coagulopathy - INR 5.6 on coumadin  7. Morbid obesity 8. Bradycardia 9. Chronic afib 10. Hx systolic / diast HF  Plan - will reduce alkali and try to increase ultrafiltration    Objective: Vital signs in last 24 hours: Temp:  [95.2 F (35.1 C)-97.9 F (36.6 C)] 97.3 F (36.3 C) (04/12 0600) Pulse Rate:  [67-109] 86 (04/12 0600) Resp:  [0-31] 30 (04/12 0600) BP: (78)/(35) 78/35 mmHg (04/11 0920) SpO2:  [90 %-100 %] 98 % (04/12 0600) FiO2 (%):  [100 %] 100 % (04/12 0600) Weight:  [136.4 kg (300 lb 11.3 oz)] 136.4 kg (300 lb 11.3 oz) (04/12 0500) Weight change: 14.8 kg (32 lb 10.1 oz)  Intake/Output from previous day: 04/11 0701 - 04/12 0700 In: 3437.8 [I.V.:1623; Blood:721.8; NG/GT:30; IV Piggyback:400] Out: 2621  Intake/Output this shift: Total I/O In: -  Out: 71 [Other:71]  Head: Normocephalic, without obvious abnormality, atraumatic, frowning Resp: diminished breath sounds bilaterally Extremities: edema 1  Lab Results:  Recent Labs  04/14/14 0221 04/15/14 0141 04/15/14 0445  WBC 11.8*  --  6.8  HGB 9.0* 11.9* 8.8*  HCT 33.8* 35.0* 30.8*  PLT 461*  --  244   BMET:  Recent Labs  04/14/14 2330 04/15/14 0141 04/15/14 0445  NA 135 134* 136  K 5.6* 4.8 4.4  CL 89* 91* 89*  CO2 27  --  29  GLUCOSE 153* 163* 149*  BUN 52* 42* 45*  CREATININE 3.67* 3.10* 2.98*  CALCIUM 7.9*  --  7.7*   No results for input(s): PTH in the last 72 hours. Iron Studies: No results for input(s): IRON, TIBC, TRANSFERRIN, FERRITIN in the last 72 hours. Studies/Results: Dg Chest Port 1 View  04/15/2014   CLINICAL DATA:  Acute respiratory failure with hypoxia.  EXAM: PORTABLE CHEST - 1 VIEW  COMPARISON:  April 14, 2014.  FINDINGS: Stable cardiomegaly. No pneumothorax is  noted. Increased bilateral pleural effusions are noted with right greater than left. Central pulmonary vascular congestion and perihilar edema is noted. Nasogastric tube is seen passing port stomach. Endotracheal tube is in grossly good position with distal tip 4 cm above the carina. Right internal jugular catheter is noted with distal tip overlying expected position of right innominate vein. Left internal jugular catheter is noted with distal tip in the expected position of the SVC.  IMPRESSION: Stable support apparatus. Continued presence of central pulmonary vascular congestion and bilateral pulmonary edema. Significantly increased bilateral pleural effusions are noted with right greater than left.   Electronically Signed   By: Marijo Conception, M.D.   On: 04/15/2014 07:35   Dg Chest Port 1 View  04/14/2014   CLINICAL DATA:  Central line placement.  EXAM: PORTABLE CHEST - 1 VIEW  COMPARISON:  Chest x-ray from the same day at 00:38  FINDINGS: New right IJ central line, tip near the right IJ/ brachiocephalic confluence. A left IJ central line is in similar position, tip at the upper SVC. The endotracheal tube and orogastric tube remains in good position.  Unchanged cardiomegaly and upper mediastinal widening. There is diffuse interstitial and airspace opacity consistent with pulmonary edema. Layering pleural effusions are likely. No pneumothorax.  IMPRESSION: 1. New right neck central line, tip near the right IJ/ brachiocephalic vein confluence. No pneumothorax. 2. Pre-existing hardware is in  unchanged position. 3. Unchanged pulmonary edema.   Electronically Signed   By: Monte Fantasia M.D.   On: 04/14/2014 05:32   Dg Chest Portable 1 View  04/14/2014   CLINICAL DATA:  Endotracheal tube placement.  EXAM: PORTABLE CHEST - 1 VIEW  COMPARISON:  Earlier this day at 0008 hr  FINDINGS: Endotracheal tube 3.3 cm from the carina. Enteric tube in place, mid and distal portions not well seen, tip obscure due to multiple  overlying artifacts. Tip of the left central line in the SVC. Cardiomegaly is grossly stable. Bilateral perihilar opacities, minimally improved in the right lung. Bilateral pleural effusions are unchanged.  IMPRESSION: 1. Endotracheal tube 3.3 cm from the carina. 2. Enteric tube in place, however not well visualized beyond the mid distal esophagus due to overlying monitoring devices.   Electronically Signed   By: Jeb Levering M.D.   On: 04/14/2014 01:56   Dg Chest Portable 1 View  04/14/2014   CLINICAL DATA:  Shortness of breath.  Central line placement  EXAM: PORTABLE CHEST - 1 VIEW  COMPARISON:  04/13/2014  FINDINGS: New left IJ catheter, tip at the upper SVC. No indication of pneumothorax.  Progressive bilateral lung opacification. There are layering pleural effusions. Stable cardiopericardial enlargement and vascular pedicle widening.  IMPRESSION: 1. New left IJ catheter is in good position. No visible pneumothorax. 2. Progressive CHF.   Electronically Signed   By: Monte Fantasia M.D.   On: 04/14/2014 00:58   Dg Chest Portable 1 View  04/13/2014   CLINICAL DATA:  Shortness of breath and bradycardia  EXAM: PORTABLE CHEST - 1 VIEW  COMPARISON:  02/20/2014  FINDINGS: Chronic cardiomegaly, with size accentuated by increase in lower mediastinal fat on recent CT. There is widening of the vascular pedicle which is also also stable.  Haziness of the lower chest compatible with pleural effusions and atelectasis. There is pulmonary venous congestion and probable basilar alveolar edema.  IMPRESSION: CHF pattern.   Electronically Signed   By: Monte Fantasia M.D.   On: 04/13/2014 22:15   Dg Abd Portable 1v  04/14/2014   CLINICAL DATA:  Metabolic acidosis.  EXAM: PORTABLE ABDOMEN - 1 VIEW  COMPARISON:  02/19/2014  FINDINGS: There is a paucity of gas throughout the abdomen. No convincing evidence for bowel obstruction. NG tube is in the stomach. No free air on this portable supine study. No visible organomegaly  or suspicious calcification.  IMPRESSION: Nonspecific bowel gas pattern with relative paucity of gas. No evidence of obstruction.   Electronically Signed   By: Rolm Baptise M.D.   On: 04/14/2014 09:19    Scheduled: . sodium chloride   Intravenous Once  . antiseptic oral rinse  7 mL Mouth Rinse QID  . ceFEPime (MAXIPIME) IV  2 g Intravenous Q12H  . chlorhexidine  15 mL Mouth Rinse BID  . clotrimazole   Topical BID  . famotidine (PEPCID) IV  20 mg Intravenous Q12H  . hydrocortisone sodium succinate  50 mg Intravenous Q6H  . insulin aspart  1-3 Units Subcutaneous 6 times per day  . insulin glargine  10 Units Subcutaneous Q24H  . vancomycin  1,250 mg Intravenous Q24H     LOS: 1 day   Byrl Latin C 04/15/2014,8:36 AM

## 2014-04-15 NOTE — Progress Notes (Signed)
Patient ID: William Flores, male   DOB: 09-20-1950, 64 y.o.   MRN: [id number]   SUBJECTIVE: Much improved today.  Now off milrinone and epinephrine, norepinephrine and dopamine have been weaned down.  Awake this morning, following commands, remains on CVVH. CVP 18.   Scheduled Meds: . sodium chloride   Intravenous Once  . antiseptic oral rinse  7 mL Mouth Rinse QID  . ceFEPime (MAXIPIME) IV  2 g Intravenous Q12H  . chlorhexidine  15 mL Mouth Rinse BID  . famotidine (PEPCID) IV  20 mg Intravenous Q12H  . hydrocortisone sodium succinate  50 mg Intravenous Q6H  . insulin aspart  1-3 Units Subcutaneous 6 times per day  . insulin glargine  10 Units Subcutaneous Q24H  . vancomycin  1,250 mg Intravenous Q24H   Continuous Infusions: . sodium chloride 10 mL/hr (04/14/14 1900)  . DOPamine 3 mcg/kg/min (04/15/14 0656)  . epinephrine Stopped (04/14/14 1311)  . fentaNYL infusion INTRAVENOUS 75 mcg/hr (04/15/14 0434)  . norepinephrine (LEVOPHED) Adult infusion 3 mcg/min (04/15/14 0655)  . dialysis replacement fluid (prismasate) 800 mL/hr at 04/15/14 0443  . dialysate (PRISMASATE) 2,000 mL/hr at 04/15/14 0619  . sodium bicarbonate (isotonic) 1000 mL infusion 500 mL/hr at 04/15/14 0602  . vasopressin (PITRESSIN) infusion - *FOR SHOCK* Stopped (04/15/14 0727)   PRN Meds:.sodium chloride, fentaNYL, heparin, heparin, sodium chloride    Filed Vitals:   04/15/14 0300 04/15/14 0400 04/15/14 0500 04/15/14 0600  BP:      Pulse: 84 77 85 86  Temp: 97.5 F (36.4 C) 97.7 F (36.5 C) 97.9 F (36.6 C) 97.3 F (36.3 C)  TempSrc:  Core (Comment)    Resp: 21 15 17 30   Height:      Weight:   300 lb 11.3 oz (136.4 kg)   SpO2: 100% 99% 99% 98%    Intake/Output Summary (Last 24 hours) at 04/15/14 0729 Last data filed at 04/15/14 0700  Gross per 24 hour  Intake 3167.11 ml  Output   2621 ml  Net 546.11 ml    LABS: Basic Metabolic Panel:  Recent Labs  04/14/14 0708  04/14/14 2330 04/15/14 0141  04/15/14 0445  NA  --   < > 135 134* 136  K  --   < > 5.6* 4.8 4.4  CL  --   < > 89* 91* 89*  CO2  --   < > 27  --  29  GLUCOSE  --   < > 153* 163* 149*  BUN  --   < > 52* 42* 45*  CREATININE  --   < > 3.67* 3.10* 2.98*  CALCIUM  --   < > 7.9*  --  7.7*  MG 2.0  --   --   --   --   PHOS  --   < > 4.2  --  3.7  < > = values in this interval not displayed. Liver Function Tests:  Recent Labs  04/13/14 2140  04/14/14 2330 04/15/14 0445  AST 26  --   --   --   ALT 20  --   --   --   ALKPHOS 69  --   --   --   BILITOT 0.6  --   --   --   PROT 5.8*  --   --   --   ALBUMIN 3.1*  < > 2.9* 2.8*  < > = values in this interval not displayed.  Recent Labs  04/14/14 0221  LIPASE 33  AMYLASE 57   CBC:  Recent Labs  04/13/14 2140  04/14/14 0221 04/15/14 0141 04/15/14 0445  WBC 12.5*  --  11.8*  --  6.8  NEUTROABS 9.2*  --   --   --  6.0  HGB 9.8*  < > 9.0* 11.9* 8.8*  HCT 36.6*  < > 33.8* 35.0* 30.8*  MCV 93.6  --  94.7  --  87.7  PLT 471*  --  461*  --  244  < > = values in this interval not displayed. Cardiac Enzymes:  Recent Labs  04/14/14 0221 04/14/14 0708 04/14/14 1308  TROPONINI <0.03 0.03 0.22*   BNP: Invalid input(s): POCBNP D-Dimer: No results for input(s): DDIMER in the last 72 hours. Hemoglobin A1C: No results for input(s): HGBA1C in the last 72 hours. Fasting Lipid Panel: No results for input(s): CHOL, HDL, LDLCALC, TRIG, CHOLHDL, LDLDIRECT in the last 72 hours. Thyroid Function Tests:  Recent Labs  04/14/14 0710  TSH 3.662   Anemia Panel: No results for input(s): VITAMINB12, FOLATE, FERRITIN, TIBC, IRON, RETICCTPCT in the last 72 hours.  RADIOLOGY: Dg Chest Port 1 View  04/14/2014   CLINICAL DATA:  Central line placement.  EXAM: PORTABLE CHEST - 1 VIEW  COMPARISON:  Chest x-ray from the same day at 00:38  FINDINGS: New right IJ central line, tip near the right IJ/ brachiocephalic confluence. A left IJ central line is in similar position, tip  at the upper SVC. The endotracheal tube and orogastric tube remains in good position.  Unchanged cardiomegaly and upper mediastinal widening. There is diffuse interstitial and airspace opacity consistent with pulmonary edema. Layering pleural effusions are likely. No pneumothorax.  IMPRESSION: 1. New right neck central line, tip near the right IJ/ brachiocephalic vein confluence. No pneumothorax. 2. Pre-existing hardware is in unchanged position. 3. Unchanged pulmonary edema.   Electronically Signed   By: Monte Fantasia M.D.   On: 04/14/2014 05:32   Dg Chest Portable 1 View  04/14/2014   CLINICAL DATA:  Endotracheal tube placement.  EXAM: PORTABLE CHEST - 1 VIEW  COMPARISON:  Earlier this day at 0008 hr  FINDINGS: Endotracheal tube 3.3 cm from the carina. Enteric tube in place, mid and distal portions not well seen, tip obscure due to multiple overlying artifacts. Tip of the left central line in the SVC. Cardiomegaly is grossly stable. Bilateral perihilar opacities, minimally improved in the right lung. Bilateral pleural effusions are unchanged.  IMPRESSION: 1. Endotracheal tube 3.3 cm from the carina. 2. Enteric tube in place, however not well visualized beyond the mid distal esophagus due to overlying monitoring devices.   Electronically Signed   By: Jeb Levering M.D.   On: 04/14/2014 01:56   Dg Chest Portable 1 View  04/14/2014   CLINICAL DATA:  Shortness of breath.  Central line placement  EXAM: PORTABLE CHEST - 1 VIEW  COMPARISON:  04/13/2014  FINDINGS: New left IJ catheter, tip at the upper SVC. No indication of pneumothorax.  Progressive bilateral lung opacification. There are layering pleural effusions. Stable cardiopericardial enlargement and vascular pedicle widening.  IMPRESSION: 1. New left IJ catheter is in good position. No visible pneumothorax. 2. Progressive CHF.   Electronically Signed   By: Monte Fantasia M.D.   On: 04/14/2014 00:58   Dg Chest Portable 1 View  04/13/2014   CLINICAL  DATA:  Shortness of breath and bradycardia  EXAM: PORTABLE CHEST - 1 VIEW  COMPARISON:  02/20/2014  FINDINGS: Chronic cardiomegaly, with size  accentuated by increase in lower mediastinal fat on recent CT. There is widening of the vascular pedicle which is also also stable.  Haziness of the lower chest compatible with pleural effusions and atelectasis. There is pulmonary venous congestion and probable basilar alveolar edema.  IMPRESSION: CHF pattern.   Electronically Signed   By: Monte Fantasia M.D.   On: 04/13/2014 22:15   Dg Abd Portable 1v  04/14/2014   CLINICAL DATA:  Metabolic acidosis.  EXAM: PORTABLE ABDOMEN - 1 VIEW  COMPARISON:  02/19/2014  FINDINGS: There is a paucity of gas throughout the abdomen. No convincing evidence for bowel obstruction. NG tube is in the stomach. No free air on this portable supine study. No visible organomegaly or suspicious calcification.  IMPRESSION: Nonspecific bowel gas pattern with relative paucity of gas. No evidence of obstruction.   Electronically Signed   By: Rolm Baptise M.D.   On: 04/14/2014 09:19    PHYSICAL EXAM General: NAD, awake on vent.  Neck: Thick, JVP 12 cm, no thyromegaly or thyroid nodule.  Lungs: Clear to auscultation bilaterally with normal respiratory effort. CV: Nondisplaced PMI.  Heart regular S1/S2, no S3/S4, no murmur. 1+ ankle edema.   Abdomen: Soft, nontender, no hepatosplenomegaly, no distention.  Neurologic: Alert on vent, follows commands  Psych: Normal affect. Extremities: No clubbing or cyanosis.   TELEMETRY: Reviewed telemetry pt in atrial fibrillation, 80s-90s.  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   1. Hypotension: Echo showed EF 65% with moderately dilated and mildly dysfunctional RV, co-ox has been normal.  Difficult situation to figure out exactly  what happened. I suspect that his RV may be abnormal due to history of OHS/OSA, doubt PE with ongoing warfarin and INR > 5 at admission. I do not think this is cardiogenic shock based on echo and co-ox (epinephrine/milrinone may have been making his EF look better but I do not think he would have particularly bad LV function at baseline). His procalcitonin and WBC count have not been particularly high, so no definite sign of infection. I wonder if the initial insult was not some form of renal insult with AKI and metabolic derangement, with profound acidosis and hyperkalemia accounting for initial presentation with bradycardia, hypotension, and obtundation. However, I am unsure what initial renal insult would have been and no family available. There is apparently a history of diarrhea for a number of days.  He is doing better today.  - Can stop vasopressin this morning.  2. AKI: Renal following, CVVH.  CVP 18, will need fluid removal. 3. Acute hypoxemic respiratory failure: Pulmonary edema in setting of AKI.  Will need fluid off via [place]. 4. Chronic atrial fibrillation: Rate controlled, INR remains > 2.   Loralie Champagne 04/15/2014 7:36 AM

## 2014-04-15 NOTE — Progress Notes (Signed)
CRITICAL VALUE ALERT  Critical value received:  PCO2 67.8  Date of notification:  04/15/14  Time of notification:  4949  Critical value read back:Yes.    Nurse who received alert:  Dennison Mascot  MD notified (1st page):  Warren Lacy MD  Time of first page:  [place]  Responding MD:  Warren Lacy  Time MD responded:  [phone]

## 2014-04-16 ENCOUNTER — Inpatient Hospital Stay (HOSPITAL_COMMUNITY): Payer: BLUE CROSS/BLUE SHIELD

## 2014-04-16 LAB — RENAL FUNCTION PANEL
ALBUMIN: 2.7 g/dL — AB (ref 3.5–5.2)
ANION GAP: 10 (ref 5–15)
Albumin: 2.7 g/dL — ABNORMAL LOW (ref 3.5–5.2)
Anion gap: 6 (ref 5–15)
BUN: 34 mg/dL — ABNORMAL HIGH (ref 6–23)
BUN: 33 mg/dL — ABNORMAL HIGH (ref 6–23)
CALCIUM: 8.3 mg/dL — AB (ref 8.4–10.5)
CHLORIDE: 99 mmol/L (ref 96–112)
CO2: 31 mmol/L (ref 19–32)
CO2: 32 mmol/L (ref 19–32)
Calcium: 7.9 mg/dL — ABNORMAL LOW (ref 8.4–10.5)
Chloride: 94 mmol/L — ABNORMAL LOW (ref 96–112)
Creatinine, Ser: 2.01 mg/dL — ABNORMAL HIGH (ref 0.50–1.35)
Creatinine, Ser: 1.69 mg/dL — ABNORMAL HIGH (ref 0.50–1.35)
GFR, EST AFRICAN AMERICAN: 39 mL/min — AB (ref >90)
GFR, EST AFRICAN AMERICAN: 48 mL/min — AB (ref >90)
GFR, EST NON AFRICAN AMERICAN: 34 mL/min — AB (ref >90)
GFR, EST NON AFRICAN AMERICAN: 42 mL/min — AB (ref >90)
Glucose, Bld: 180 mg/dL — ABNORMAL HIGH (ref 70–99)
Glucose, Bld: 149 mg/dL — ABNORMAL HIGH (ref 70–99)
PHOSPHORUS: 4.8 mg/dL — AB (ref 2.3–4.6)
POTASSIUM: 3.9 mmol/L (ref 3.5–5.1)
Phosphorus: 4.6 mg/dL (ref 2.3–4.6)
Potassium: 4 mmol/L (ref 3.5–5.1)
SODIUM: 135 mmol/L (ref 135–145)
SODIUM: 137 mmol/L (ref 135–145)

## 2014-04-16 LAB — BLOOD GAS, ARTERIAL

## 2014-04-16 LAB — GLUCOSE, CAPILLARY
GLUCOSE-CAPILLARY: 143 mg/dL — AB (ref 70–99)
GLUCOSE-CAPILLARY: 158 mg/dL — AB (ref 70–99)
GLUCOSE-CAPILLARY: 163 mg/dL — AB (ref 70–99)
Glucose-Capillary: 226 mg/dL — ABNORMAL HIGH (ref 70–99)
Glucose-Capillary: 185 mg/dL — ABNORMAL HIGH (ref 70–99)
Glucose-Capillary: 114 mg/dL — ABNORMAL HIGH (ref 70–99)

## 2014-04-16 LAB — HEPARIN LEVEL (UNFRACTIONATED)
HEPARIN UNFRACTIONATED: 0.15 [IU]/mL — AB (ref 0.30–0.70)
Heparin Unfractionated: 0.47 IU/mL (ref 0.30–0.70)

## 2014-04-16 LAB — MAGNESIUM: MAGNESIUM: 2.3 mg/dL (ref 1.5–2.5)

## 2014-04-16 LAB — APTT: APTT: 44 s — AB (ref 24–37)

## 2014-04-16 LAB — PROCALCITONIN: Procalcitonin: 0.57 ng/mL

## 2014-04-16 LAB — PROTIME-INR
INR: 1.2 (ref 0.00–1.49)
PROTHROMBIN TIME: 15.4 s — AB (ref 11.6–15.2)

## 2014-04-16 MED ORDER — HEPARIN BOLUS VIA INFUSION
2000.0000 [IU] | Freq: Once | INTRAVENOUS | Status: AC
  Administered 2014-04-16: 2000 [IU] via INTRAVENOUS
  Filled 2014-04-16: qty 2000

## 2014-04-16 MED ORDER — HYDROCORTISONE NA SUCCINATE PF 100 MG IJ SOLR
50.0000 mg | Freq: Two times a day (BID) | INTRAMUSCULAR | Status: DC
  Administered 2014-04-16 – 2014-04-17 (×2): 50 mg via INTRAVENOUS
  Filled 2014-04-16 (×3): qty 1

## 2014-04-16 MED ORDER — WARFARIN - PHARMACIST DOSING INPATIENT
Freq: Every day | Status: DC
Start: 2014-04-16 — End: 2014-04-23
  Administered 2014-04-21: 18:00:00

## 2014-04-16 MED ORDER — HEPARIN BOLUS VIA INFUSION
4000.0000 [IU] | Freq: Once | INTRAVENOUS | Status: AC
  Administered 2014-04-16: 4000 [IU] via INTRAVENOUS
  Filled 2014-04-16: qty 4000

## 2014-04-16 MED ORDER — FENTANYL CITRATE 0.05 MG/ML IJ SOLN
12.5000 ug | INTRAMUSCULAR | Status: DC | PRN
  Filled 2014-04-16 (×3): qty 2

## 2014-04-16 MED ORDER — HEPARIN (PORCINE) IN NACL 100-0.45 UNIT/ML-% IJ SOLN
1500.0000 [IU]/h | INTRAMUSCULAR | Status: DC
  Administered 2014-04-16: 1500 [IU]/h via INTRAVENOUS
  Administered 2014-04-16: 1300 [IU]/h via INTRAVENOUS
  Filled 2014-04-16 (×5): qty 250

## 2014-04-16 MED ORDER — WARFARIN SODIUM 7.5 MG PO TABS
7.5000 mg | ORAL_TABLET | Freq: Once | ORAL | Status: DC
  Filled 2014-04-16: qty 1

## 2014-04-16 NOTE — Consult Note (Signed)
[place] for Heparin  Indication: atrial fibrillation  Allergies  Allergen Reactions  . Codeine     Headache     Patient Measurements: Height: 5' 7"  (170.2 cm) Weight: 295 lb 13.7 oz (134.2 kg) IBW/kg (Calculated) : 66.1 Heparin Dosing Weight: 98.8kg  Vital Signs: Temp: 96.8 F (36 C) (04/13 2000) Pulse Rate: 85 (04/13 2000)  Labs:  Recent Labs  04/13/14 2140  04/14/14 0221  04/14/14 0708 04/14/14 1308 04/14/14 1602  04/15/14 0141 04/15/14 0445 04/15/14 1515 04/16/14 0400 04/16/14 1145 04/16/14 1509 04/16/14 2030  HGB 9.8*  < > 9.0*  --   --   --   --   --  11.9* 8.8*  --   --   --   --   --   HCT 36.6*  < > 33.8*  --   --   --   --   --  35.0* 30.8*  --   --   --   --   --   PLT 471*  --  461*  --   --   --   --   --   --  244  --   --   --   --   --   APTT  --   --   --   --  71*  --   --   --   --  53*  --  44*  --   --   --   LABPROT 53.6*  --   --   < >  --   --  32.0*  --   --  26.3*  --  15.4*  --   --   --   INR 5.96*  --   --   < >  --   --  3.08*  --   --  2.39*  --  1.20  --   --   --   HEPARINUNFRC  --   --   --   --   --   --   --   --   --   --   --   --  0.15*  --  0.47  CREATININE 5.66*  < > 5.64*  < >  --   --   --   < > 3.10* 2.98* 2.44* 2.01*  --  1.69*  --   TROPONINI  --   --  <0.03  --  0.03 0.22*  --   --   --   --   --   --   --   --   --   < > = values in this interval not displayed.  Estimated Creatinine Clearance: 59.8 mL/min (by C-G formula based on Cr of 1.69).   Medical History: Past Medical History  Diagnosis Date  . Diabetes   . Colon cancer   . Hypertension   . Sleep apnea   . Atrial fibrillation   . Hyperlipidemia   . CHF (congestive heart failure)   . Colon polyps 09/21/2012    Tubular adenoma    Medications:  Scheduled:  . sodium chloride   Intravenous Once  . ceFEPime (MAXIPIME) IV  2 g Intravenous Q12H  . clotrimazole   Topical BID  . famotidine (PEPCID) IV  20 mg  Intravenous Q24H  . feeding supplement (PRO-STAT SUGAR FREE 64)  30 mL Per Tube 5 X Daily  . hydrocortisone sodium succinate  50 mg Intravenous  Q12H  . insulin aspart  1-3 Units Subcutaneous 6 times per day  . insulin glargine  10 Units Subcutaneous Q24H  . sodium chloride  10-40 mL Intracatheter Q12H  . Warfarin - Pharmacist Dosing Inpatient   Does not apply q1800    Assessment: Heparin level therapeutic Goal of Therapy:  Heparin level 0.3-0.7 units/ml Monitor platelets by anticoagulation protocol: Yes   Plan:  Continue heparin at 1500 units/hr F/u am labs  Thanks for allowing pharmacy to be a part of this patient's care.  Excell Seltzer, PharmD Clinical Pharmacist, [phone]   04/16/2014 .9:28 PM   ________________________________________________ First heparin level has returned subtherapeutic at 0.15. Heparin levell drawn approximately 1 hour early, but still expect No bleeding noted. No issues with infusion per RN.  Plan: Bolus 2000 units x 1 Increase heparin gtt to 1500 units/hr 6h HL at 2100 Daily HL/CBC Monitor s/sx of bleeding Restart warfarin tonight (see student note)  Thank you for allowing pharmacy to be part of this patient's care team  Carly M. Sabat, Pharm.D Clinical Pharmacy Resident Pager: [phone] 04/16/2014 .9:28 PM

## 2014-04-16 NOTE — Progress Notes (Signed)
[facility] Progress Note Patient Name: William Flores DOB: 06-17-1950 MRN: [id number]   Date of Service  04/16/2014  HPI/Events of Note  Bedside nurse requests speech swallowing evaluation.   eICU Interventions  Will order speech swallowing evaluation.     Intervention Category Minor Interventions: Routine modifications to care plan (e.g. PRN medications for pain, fever)  Sommer,Steven Eugene 04/16/2014, 3:39 PM

## 2014-04-16 NOTE — Progress Notes (Signed)
156m's of Fentanyl (127m/ml) wasted in sink. Witnessed by FrJulien GirtRN and SaNewman NickelsRN.

## 2014-04-16 NOTE — Progress Notes (Signed)
04/16/2014- Respiratory care note- Pt suctioned and extubated to 6lpm nasal cannula at 1005 as per MD order.  Pt tolerated procedure well with vital signs of hr110, rr12, sats 94% on 6lpm cannula. Pt with a positive cuff leak prior to extubation.  Pt able to vocalize and state pt name post extubation, also.  Ventilator and ambu-bag at bedside.  Will encourage cough and deep breathing.  Weaning oxygen as tolerated.  RN at bedside for procedure.  s Merrin Mcvicker rrt, rcp

## 2014-04-16 NOTE — Progress Notes (Signed)
Patient ID: William Flores, male   DOB: February 27, 1950, 64 y.o.   MRN: [id number]   SUBJECTIVE: Continues to improve.  Off all pressors now.  Awake, follows commands on vent. CVVH ongoing with 100 cc/hr fluid removal.  CVP 15.    Scheduled Meds: . sodium chloride   Intravenous Once  . antiseptic oral rinse  7 mL Mouth Rinse QID  . ceFEPime (MAXIPIME) IV  2 g Intravenous Q12H  . chlorhexidine  15 mL Mouth Rinse BID  . clotrimazole   Topical BID  . famotidine (PEPCID) IV  20 mg Intravenous Q24H  . feeding supplement (PRO-STAT SUGAR FREE 64)  30 mL Per Tube 5 X Daily  . feeding supplement (VITAL HIGH PROTEIN)  1,000 mL Per Tube Q24H  . hydrocortisone sodium succinate  50 mg Intravenous Q6H  . insulin aspart  1-3 Units Subcutaneous 6 times per day  . insulin glargine  10 Units Subcutaneous Q24H  . sodium chloride  10-40 mL Intracatheter Q12H  . vancomycin  1,250 mg Intravenous Q24H   Continuous Infusions: . sodium chloride Stopped (04/15/14 0700)  . DOPamine 0 mcg/kg/min (04/15/14 1023)  . epinephrine Stopped (04/14/14 1311)  . fentaNYL infusion INTRAVENOUS 75 mcg/hr (04/16/14 0711)  . heparin 1,300 Units/hr (04/16/14 0726)  . norepinephrine (LEVOPHED) Adult infusion 2 mcg/min (04/16/14 0711)  . dialysis replacement fluid (prismasate) 200 mL/hr at 04/15/14 1738  . dialysis replacement fluid (prismasate) 200 mL/hr at 04/15/14 1742  . dialysate (PRISMASATE) 2,000 mL/hr at 04/16/14 0620  . vasopressin (PITRESSIN) infusion - *FOR SHOCK* Stopped (04/15/14 0727)   PRN Meds:.sodium chloride, fentaNYL, heparin, heparin, sodium chloride, sodium chloride    Filed Vitals:   04/16/14 0417 04/16/14 0500 04/16/14 0600 04/16/14 0700  BP:      Pulse:  95 91 86  Temp:  97.3 F (36.3 C) 97.7 F (36.5 C) 97.9 F (36.6 C)  TempSrc:      Resp:  15 0 15  Height:      Weight: 295 lb 13.7 oz (134.2 kg)     SpO2:  96% 96% 96%    Intake/Output Summary (Last 24 hours) at 04/16/14 0736 Last data filed  at 04/16/14 0700  Gross per 24 hour  Intake 1517.57 ml  Output   3708 ml  Net -2190.43 ml    LABS: Basic Metabolic Panel:  Recent Labs  04/14/14 0708  04/15/14 1515 04/16/14 0400  NA  --   < > 135 135  K  --   < > 3.7 3.9  CL  --   < > 91* 94*  CO2  --   < > 31 31  GLUCOSE  --   < > 136* 180*  BUN  --   < > 32* 34*  CREATININE  --   < > 2.44* 2.01*  CALCIUM  --   < > 7.4* 7.9*  MG 2.0  --   --  2.3  PHOS  --   < > 5.6* 4.8*  < > = values in this interval not displayed. Liver Function Tests:  Recent Labs  04/13/14 2140  04/15/14 1515 04/16/14 0400  AST 26  --   --   --   ALT 20  --   --   --   ALKPHOS 69  --   --   --   BILITOT 0.6  --   --   --   PROT 5.8*  --   --   --   ALBUMIN  3.1*  < > 2.5* 2.7*  < > = values in this interval not displayed.  Recent Labs  04/14/14 0221  LIPASE 33  AMYLASE 57   CBC:  Recent Labs  04/13/14 2140  04/14/14 0221 04/15/14 0141 04/15/14 0445  WBC 12.5*  --  11.8*  --  6.8  NEUTROABS 9.2*  --   --   --  6.0  HGB 9.8*  < > 9.0* 11.9* 8.8*  HCT 36.6*  < > 33.8* 35.0* 30.8*  MCV 93.6  --  94.7  --  87.7  PLT 471*  --  461*  --  244  < > = values in this interval not displayed. Cardiac Enzymes:  Recent Labs  04/14/14 0221 04/14/14 0708 04/14/14 1308  TROPONINI <0.03 0.03 0.22*   BNP: Invalid input(s): POCBNP D-Dimer: No results for input(s): DDIMER in the last 72 hours. Hemoglobin A1C: No results for input(s): HGBA1C in the last 72 hours. Fasting Lipid Panel: No results for input(s): CHOL, HDL, LDLCALC, TRIG, CHOLHDL, LDLDIRECT in the last 72 hours. Thyroid Function Tests:  Recent Labs  04/14/14 0710  TSH 3.662   Anemia Panel: No results for input(s): VITAMINB12, FOLATE, FERRITIN, TIBC, IRON, RETICCTPCT in the last 72 hours.  RADIOLOGY: Dg Chest Port 1 View  04/15/2014   CLINICAL DATA:  Acute respiratory failure with hypoxia.  EXAM: PORTABLE CHEST - 1 VIEW  COMPARISON:  April 14, 2014.  FINDINGS:  Stable cardiomegaly. No pneumothorax is noted. Increased bilateral pleural effusions are noted with right greater than left. Central pulmonary vascular congestion and perihilar edema is noted. Nasogastric tube is seen passing port stomach. Endotracheal tube is in grossly good position with distal tip 4 cm above the carina. Right internal jugular catheter is noted with distal tip overlying expected position of right innominate vein. Left internal jugular catheter is noted with distal tip in the expected position of the SVC.  IMPRESSION: Stable support apparatus. Continued presence of central pulmonary vascular congestion and bilateral pulmonary edema. Significantly increased bilateral pleural effusions are noted with right greater than left.   Electronically Signed   By: Marijo Conception, M.D.   On: 04/15/2014 07:35   Dg Chest Port 1 View  04/14/2014   CLINICAL DATA:  Central line placement.  EXAM: PORTABLE CHEST - 1 VIEW  COMPARISON:  Chest x-ray from the same day at 00:38  FINDINGS: New right IJ central line, tip near the right IJ/ brachiocephalic confluence. A left IJ central line is in similar position, tip at the upper SVC. The endotracheal tube and orogastric tube remains in good position.  Unchanged cardiomegaly and upper mediastinal widening. There is diffuse interstitial and airspace opacity consistent with pulmonary edema. Layering pleural effusions are likely. No pneumothorax.  IMPRESSION: 1. New right neck central line, tip near the right IJ/ brachiocephalic vein confluence. No pneumothorax. 2. Pre-existing hardware is in unchanged position. 3. Unchanged pulmonary edema.   Electronically Signed   By: Monte Fantasia M.D.   On: 04/14/2014 05:32   Dg Chest Portable 1 View  04/14/2014   CLINICAL DATA:  Endotracheal tube placement.  EXAM: PORTABLE CHEST - 1 VIEW  COMPARISON:  Earlier this day at 0008 hr  FINDINGS: Endotracheal tube 3.3 cm from the carina. Enteric tube in place, mid and distal portions  not well seen, tip obscure due to multiple overlying artifacts. Tip of the left central line in the SVC. Cardiomegaly is grossly stable. Bilateral perihilar opacities, minimally improved in the right lung. Bilateral  pleural effusions are unchanged.  IMPRESSION: 1. Endotracheal tube 3.3 cm from the carina. 2. Enteric tube in place, however not well visualized beyond the mid distal esophagus due to overlying monitoring devices.   Electronically Signed   By: Jeb Levering M.D.   On: 04/14/2014 01:56   Dg Chest Portable 1 View  04/14/2014   CLINICAL DATA:  Shortness of breath.  Central line placement  EXAM: PORTABLE CHEST - 1 VIEW  COMPARISON:  04/13/2014  FINDINGS: New left IJ catheter, tip at the upper SVC. No indication of pneumothorax.  Progressive bilateral lung opacification. There are layering pleural effusions. Stable cardiopericardial enlargement and vascular pedicle widening.  IMPRESSION: 1. New left IJ catheter is in good position. No visible pneumothorax. 2. Progressive CHF.   Electronically Signed   By: Monte Fantasia M.D.   On: 04/14/2014 00:58   Dg Chest Portable 1 View  04/13/2014   CLINICAL DATA:  Shortness of breath and bradycardia  EXAM: PORTABLE CHEST - 1 VIEW  COMPARISON:  02/20/2014  FINDINGS: Chronic cardiomegaly, with size accentuated by increase in lower mediastinal fat on recent CT. There is widening of the vascular pedicle which is also also stable.  Haziness of the lower chest compatible with pleural effusions and atelectasis. There is pulmonary venous congestion and probable basilar alveolar edema.  IMPRESSION: CHF pattern.   Electronically Signed   By: Monte Fantasia M.D.   On: 04/13/2014 22:15   Dg Abd Portable 1v  04/14/2014   CLINICAL DATA:  Metabolic acidosis.  EXAM: PORTABLE ABDOMEN - 1 VIEW  COMPARISON:  02/19/2014  FINDINGS: There is a paucity of gas throughout the abdomen. No convincing evidence for bowel obstruction. NG tube is in the stomach. No free air on this  portable supine study. No visible organomegaly or suspicious calcification.  IMPRESSION: Nonspecific bowel gas pattern with relative paucity of gas. No evidence of obstruction.   Electronically Signed   By: Rolm Baptise M.D.   On: 04/14/2014 09:19    PHYSICAL EXAM General: NAD, awake on vent.  Neck: Thick, JVP 10 cm, no thyromegaly or thyroid nodule.  Lungs: Clear to auscultation bilaterally with normal respiratory effort. CV: Nondisplaced PMI.  Heart regular S1/S2, no S3/S4, no murmur. 1+ ankle edema.   Abdomen: Soft, nontender, no hepatosplenomegaly, no distention.  Neurologic: Alert on vent, follows commands  Psych: Normal affect. Extremities: No clubbing or cyanosis.   TELEMETRY: Reviewed telemetry pt in atrial fibrillation, 80s-90s.  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   1. Hypotension: Echo showed EF 65% with moderately dilated and mildly dysfunctional RV, co-ox has been normal.  Difficult situation to figure out exactly what happened. I suspect that his RV may be abnormal due to history of OHS/OSA, doubt PE with ongoing warfarin and INR > 5 at admission. I do not think this is cardiogenic shock based on echo and co-ox (epinephrine/milrinone may have been making his EF look better but I do not think he would have particularly bad LV function at baseline). His procalcitonin and WBC count have not been particularly high, so no definite sign of infection. I wonder if the initial insult was not some form of renal insult with AKI and metabolic derangement, with profound acidosis and hyperkalemia accounting for initial presentation with bradycardia, hypotension, and obtundation. However, I am unsure what initial renal insult would have been and no family available.  There is apparently a history of diarrhea for a number  of days.  He is doing better today and is off pressors now.  2. AKI: Renal following, CVVH.  CVP 15, ultrafiltration ongoing 100 cc/hr.  3. Acute hypoxemic respiratory failure: Pulmonary edema in setting of AKI.  Will need fluid off via CVVH. Trial PSV this morning.  4. Chronic atrial fibrillation: Rate controlled, reasonable to restart anticoagulation as no invasive procedures planned at this time. INR 1.2 today.   Loralie Champagne 04/16/2014 7:36 AM

## 2014-04-16 NOTE — Progress Notes (Signed)
PULMONARY / CRITICAL CARE MEDICINE   Name: William Flores MRN: [id number] DOB: 19-Dec-1950    ADMISSION DATE:  04/13/2014 CONSULTATION DATE:  04/13/14  REFERRING MD :  Ashok Cordia  CHIEF COMPLAINT:  Shortness of breath  INITIAL PRESENTATION: 64 y/o male came to the [facility] ED on 4/10 by EMS for "low oxygen and trouble breathing".  He was found to be in cardiogenic shock, bradycardic, in acute renal failure, hyperkalemic, and with lactic acidosis.   STUDIES:  4/11 KUB: non-obstructive gas pattern 4/11 Echo: EF 65-70%, no WMA, RV and RA severely dilated, PA pressure 85mHg  SIGNIFICANT EVENTS: 4/11: cardiogenic shock, multiple pressors, intubated 4/12: weaning off pressors   HISTORY OF PRESENT ILLNESS:  64[facility] "low oxygen and trouble breathing".  He was found to be in cardiogenic shock, bradycardic, in acute renal failure, hyperkalemic, and in a severe metabolic acidosis. There was no family to provide history and the patient was encephalopathic so no further history could be obtained.   SUBJECTIVE: William Flores off of vasopressors this morning, weaning on vent  VITAL SIGNS: Temp:  [97.2 F (36.2 C)-98.2 F (36.8 C)] 97.7 F (36.5 C) (04/13 0900) Pulse Rate:  [78-107] 88 (04/13 0900) Resp:  [0-20] 19 (04/13 0900) BP: (95)/(64) 95/64 mmHg (04/13 0735) SpO2:  [95 %-100 %] 99 % (04/13 0900) FiO2 (%):  [50 %-60 %] 50 % (04/13 0735) Weight:  [134.2 kg (295 lb 13.7 oz)] 134.2 kg (295 lb 13.7 oz) (04/13 0417) HEMODYNAMICS: CVP:  [12 mmHg-19 mmHg] 15 mmHg VENTILATOR SETTINGS: Vent Mode:  [-] CPAP;PSV FiO2 (%):  [50 %-60 %] 50 % Set Rate:  [15 bmp] 15 bmp Vt Set:  [520 mL] 520 mL PEEP:  [5 cmH20] 5 cmH20 Pressure Support:  [10 cmH20] 10 cmH20 Plateau Pressure:  [12 cmH20-22 cmH20] 22 cmH20 INTAKE / OUTPUT:  Intake/Output Summary (Last 24 hours) at 04/16/14 0914 Last data filed at 04/16/14 0800  Gross per 24 hour  Intake 1737.19 ml  Output    3757 ml  Net -2019.81 ml    PHYSICAL EXAMINATION: Gen: obese, comfortable on vent HENT: NCAT, ETT in place, neck supple  Eyes: EOMi PULM: Few crackles in bases, non-labored breathing CV: Irreg irreg,distant heart sounds, cannot assess JVD GI: BS+, soft, nontender MSK: slightly decreased bulk, normal tone Neuro: Awake and alert on vent, nods head to questions, follows commands, maew   LABS:  CBC  Recent Labs Lab 04/13/14 2140  04/14/14 0221 04/15/14 0141 04/15/14 0445  WBC 12.5*  --  11.8*  --  6.8  HGB 9.8*  < > 9.0* 11.9* 8.8*  HCT 36.6*  < > 33.8* 35.0* 30.8*  PLT 471*  --  461*  --  244  < > = values in this interval not displayed. Coag's  Recent Labs Lab 04/14/14 0708 04/14/14 1602 04/15/14 0445 04/16/14 0400  APTT 71*  --  53* 44*  INR  --  3.08* 2.39* 1.20   BMET  Recent Labs Lab 04/15/14 0445 04/15/14 1515 04/16/14 0400  NA 136 135 135  K 4.4 3.7 3.9  CL 89* 91* 94*  CO2 29 31 31   BUN 45* 32* 34*  CREATININE 2.98* 2.44* 2.01*  GLUCOSE 149* 136* 180*   Electrolytes  Recent Labs Lab 04/14/14 0708  04/15/14 0445 04/15/14 1515 04/16/14 0400  CALCIUM  --   < > 7.7* 7.4* 7.9*  MG 2.0  --   --   --  2.3  PHOS  --   < > 3.7 5.6* 4.8*  < > = values in this interval not displayed. Sepsis Markers  Recent Labs Lab 04/14/14 0213 04/14/14 0221 04/14/14 0710 04/14/14 1310 04/15/14 0445 04/16/14 0400  LATICACIDVEN 9.6*  --  11.6* 10.1*  --   --   PROCALCITON  --  0.11  --   --  0.63 0.57   ABG  Recent Labs Lab 04/15/14 0123 04/15/14 1042 04/15/14 1543  PHART 7.464* 7.429 7.335*  PCO2ART 38.5 51.0* 66.0*  PO2ART 100.0 143.0* 90.0   Liver Enzymes  Recent Labs Lab 04/13/14 2140  04/15/14 0445 04/15/14 1515 04/16/14 0400  AST 26  --   --   --   --   ALT 20  --   --   --   --   ALKPHOS 69  --   --   --   --   BILITOT 0.6  --   --   --   --   ALBUMIN 3.1*  < > 2.8* 2.5* 2.7*  < > = values in this interval not  displayed. Cardiac Enzymes  Recent Labs Lab 04/14/14 0221 04/14/14 0708 04/14/14 1308  TROPONINI <0.03 0.03 0.22*   Glucose  Recent Labs Lab 04/15/14 1151 04/15/14 1513 04/15/14 2018 04/16/14 0031 04/16/14 0412 04/16/14 0740  GLUCAP 123* 134* 137* 158* 226* 163*    Imaging Dg Chest Port 1 View  04/15/2014   CLINICAL DATA:  Acute respiratory failure with hypoxia.  EXAM: PORTABLE CHEST - 1 VIEW  COMPARISON:  April 14, 2014.  FINDINGS: Stable cardiomegaly. No pneumothorax is noted. Increased bilateral pleural effusions are noted with right greater than left. Central pulmonary vascular congestion and perihilar edema is noted. Nasogastric tube is seen passing port stomach. Endotracheal tube is in grossly good position with distal tip 4 cm above the carina. Right internal jugular catheter is noted with distal tip overlying expected position of right innominate vein. Left internal jugular catheter is noted with distal tip in the expected position of the SVC.  IMPRESSION: Stable support apparatus. Continued presence of central pulmonary vascular congestion and bilateral pulmonary edema. Significantly increased bilateral pleural effusions are noted with right greater than left.   Electronically Signed   By: Marijo Conception, M.D.   On: 04/15/2014 07:35     ASSESSMENT / PLAN:  PULMONARY OETT 4/11 >> A: Acute respiratory failure due to acute pulmonary edema vs HCAP> improving with volume removal, still requiring mechanical ventilation History of stridor post extubation last hospital stay P:   -see ID -see cardiology -SBT today -check cuff leak prior to extubation, may need decadron  CARDIOVASCULAR CVL L IJ 4/11 >> A: Acute circulatory shock, improving, felt to be due to acidosis Chronic systolic heart failure > echo showed improved LVEF Bradycardia due to metabolic acidosis > resolved Chronic atrial fibriliation on warfarin  P:  -appreciate heart failure team -continue hold home  BP medications (metoprolol, lisinopril) -MAP goal > 65  RENAL A:   Acute kidney injury, uncertain etiology > diarrhea? Lack of forward flow? Metabolic acidosis> improved Hyperkalemia> resolved P:   -consult renal, appreciate recs pt on CVVHD  GASTROINTESTINAL A:  Diarrhea > resolved P:   -OG tube -tube feedings for nutrition -pepcid for SUP  HEMATOLOGIC A:  Coagulopathy due to warfarin, INR >5 on admit, now resolved, not bleeding P:  -warfarin per pharm -monitor for bleeding -hold warfarin  INFECTIOUS A:  Still unclear if he was infected on  admission, favor that this was all due to acidosis; treating for HCAP but little evidence for this.  Candidial intertrigo   P:   BCx2 4/11 >  Abx: vanc, start date 4/11 > 4/13 Abx Cefepime, start date 4/11 > stop after 5 days  Topical clotrimazole for groin bilaterally BID  ENDOCRINE A:  DM2 P:   -SSI q4  NEUROLOGIC A:  Acute encephalopathy due to hypoxemia, heart failure, hypercapnea  P:   RASS goal: -1 Fentanyl gtt   FAMILY  - Updates: Son [name] updated by [facility] on phone on 4/10.  Left message on 4/13.    My cc time 40 minutes   Roselie Awkward, MD [place] [facility] Pager: [phone] Cell: [phone] If no response, call [phone]

## 2014-04-16 NOTE — Evaluation (Signed)
Clinical/Bedside Swallow Evaluation Patient Details  Name: William Flores MRN: [id number] Date of Birth: 1950/07/18  Today's Date: 04/16/2014 Time: SLP Start Time (ACUTE ONLY): 9030 SLP Stop Time (ACUTE ONLY): 1630 SLP Time Calculation (min) (ACUTE ONLY): 13 min  Past Medical History:  Past Medical History  Diagnosis Date  . Diabetes   . Colon cancer   . Hypertension   . Sleep apnea   . Atrial fibrillation   . Hyperlipidemia   . CHF (congestive heart failure)   . Colon polyps 09/21/2012    Tubular adenoma   Past Surgical History:  Past Surgical History  Procedure Laterality Date  . Colon resection    . Circumcision    . Colonoscopy N/A 09/21/2012    Procedure: COLONOSCOPY;  Surgeon: Inda Castle, MD;  Location: [facility] ENDOSCOPY;  Service: Endoscopy;  Laterality: N/A;  . US echocardiography  03/04/2010    abnormal study   HPI:  64 y/o male came to the [facility] ED on 4/10 by EMS for "low oxygen and trouble breathing". He was found to be in cardiogenic shock, bradycardic, in acute renal failure, hyperkalemic, and with lactic acidosis. Pt was intubated 4/10-4/13.   Assessment / Plan / Recommendation Clinical Impression  Pt is tired this afternoon and aphonic s/p intubation. Trials of thin liquids and purees result in multiple swallows and very weak, delayed coughing. His clinical presentation is concerning for decreased airway protection and sensation, making aspiration risk high at this time. Will return on next date to assess readiness for PO diet versus objective testing. This is likely an acute, reversible dysphagia with prognosis for diet advancement good with increased time post-extubation.    Aspiration Risk  Severe    Diet Recommendation NPO   Medication Administration: Via alternative means    Other  Recommendations Oral Care Recommendations: Oral care Q4 per protocol   Follow Up Recommendations   (tba)    Frequency and Duration min 3x week  1 week   Pertinent  Vitals/Pain [facility]    SLP Swallow Goals     Swallow Study Prior Functional Status       General Date of Onset: 04/13/14 HPI: 64 y/o male came to the [facility] ED on 4/10 by EMS for "low oxygen and trouble breathing". He was found to be in cardiogenic shock, bradycardic, in acute renal failure, hyperkalemic, and with lactic acidosis. Pt was intubated 4/10-4/13. Type of Study: Bedside swallow evaluation Previous Swallow Assessment: BSE during recent admission recommending regular diet and thin liquids Diet Prior to this Study: NPO Temperature Spikes Noted: No Respiratory Status: Nasal cannula History of Recent Intubation: Yes Length of Intubations (days): 4 days Date extubated: 04/16/14 Behavior/Cognition: Alert;Cooperative;Other (comment) (tired) Oral Cavity - Dentition: Adequate natural dentition Self-Feeding Abilities: Needs assist Patient Positioning: Upright in bed Baseline Vocal Quality: Aphonic Volitional Cough: Weak    Oral/Motor/Sensory Function Overall Oral Motor/Sensory Function: Appears within functional limits for tasks assessed   Ice Chips Ice chips: Within functional limits Presentation: Spoon   Thin Liquid Thin Liquid: Impaired Presentation: Cup Pharyngeal  Phase Impairments: Suspected delayed Swallow;Cough - Delayed    Nectar Thick Nectar Thick Liquid: Not tested   Honey Thick Honey Thick Liquid: Not tested   Puree Puree: Impaired Presentation: Spoon Pharyngeal Phase Impairments: Suspected delayed Swallow;Multiple swallows;Cough - Delayed   Solid   Solid: Not tested      Germain Osgood, M.A. CCC-SLP [phone]   Germain Osgood 04/16/2014,4:44 PM

## 2014-04-16 NOTE — Progress Notes (Signed)
ANTICOAGULATION CONSULT NOTE - Follow Up Consult  Pharmacy Consult for warfarin Indication: atrial fibrillation  Allergies  Allergen Reactions  . Codeine     Headache     Patient Measurements: Height: 5' 7"  (170.2 cm) Weight: 295 lb 13.7 oz (134.2 kg) IBW/kg (Calculated) : 66.1  Vital Signs: Temp: 97.7 F (36.5 C) (04/13 1100) BP: 95/64 mmHg (04/13 0735) Pulse Rate: 102 (04/13 1100)  Labs:  Recent Labs  04/13/14 2140  04/14/14 0221  04/14/14 0708 04/14/14 1308 04/14/14 1602  04/15/14 0141 04/15/14 0445 04/15/14 1515 04/16/14 0400  HGB 9.8*  < > 9.0*  --   --   --   --   --  11.9* 8.8*  --   --   HCT 36.6*  < > 33.8*  --   --   --   --   --  35.0* 30.8*  --   --   PLT 471*  --  461*  --   --   --   --   --   --  244  --   --   APTT  --   --   --   --  71*  --   --   --   --  53*  --  44*  LABPROT 53.6*  --   --   < >  --   --  32.0*  --   --  26.3*  --  15.4*  INR 5.96*  --   --   < >  --   --  3.08*  --   --  2.39*  --  1.20  CREATININE 5.66*  < > 5.64*  < >  --   --   --   < > 3.10* 2.98* 2.44* 2.01*  TROPONINI  --   --  <0.03  --  0.03 0.22*  --   --   --   --   --   --   < > = values in this interval not displayed.  Estimated Creatinine Clearance: 50.3 mL/min (by C-G formula based on Cr of 2.01).  Assessment: 63 YOM w/ hx of afib, on chronic warfarin PTA. INR supratherapeutic on admission at 5.96; warfarin held and heparin gtt initiated today when INR returned 1.2 s/p 94m VitK and FFP on 4/11. Pt is expected to be extubated and taking PO this evening - restarting warfarin. Last OP anticoag note (01/16/14) is for 7.5 mg daily except 10 mg MF - total predictor points 6. Expecting to be similar to OP dose but since admitted supratherapeutic, dosed according to nomogram. No high risk characteristics. H/H 8.8/30.8, plts 244. No s/sx bleeding.   Goal of Therapy:  INR 2-3 Monitor platelets by anticoagulation protocol: Yes   Plan:  Warfarin 7.5 mg @ 1800  F/u daily  CBC and INR  Monitor s/sx bleeding HL @ 1300 - plan to continue until therapeutic INR  WGinny Forth4/13/2016,11:31 AM

## 2014-04-16 NOTE — Progress Notes (Signed)
Assessment: 1. Acute kidney injury - d/t shock   Plan - cont UF on CVVHD  Subjective: Interval History: Improved oxygenation  Objective: Vital signs in last 24 hours: Temp:  [97.2 F (36.2 C)-98.2 F (36.8 C)] 97.7 F (36.5 C) (04/13 0900) Pulse Rate:  [78-107] 88 (04/13 0900) Resp:  [0-20] 19 (04/13 0900) BP: (95)/(64) 95/64 mmHg (04/13 0735) SpO2:  [95 %-100 %] 99 % (04/13 0900) FiO2 (%):  [50 %-60 %] 50 % (04/13 0735) Weight:  [134.2 kg (295 lb 13.7 oz)] 134.2 kg (295 lb 13.7 oz) (04/13 0417) Weight change: -2.2 kg (-4 lb 13.6 oz)  Intake/Output from previous day: 04/12 0701 - 04/13 0700 In: 1534 [I.V.:429.3; NG/GT:704.7; IV Piggyback:400] Out: 3708 [Urine:235; Emesis/NG output:60] Intake/Output this shift: Total I/O In: 388.1 [I.V.:28.1; NG/GT:110; IV Piggyback:250] Out: 469 [Urine:80; Other:389]  Overall less edematous  Intubated Awake and appears alert  Lab Results:  Recent Labs  04/14/14 0221 04/15/14 0141 04/15/14 0445  WBC 11.8*  --  6.8  HGB 9.0* 11.9* 8.8*  HCT 33.8* 35.0* 30.8*  PLT 461*  --  244   BMET:  Recent Labs  04/15/14 1515 04/16/14 0400  NA 135 135  K 3.7 3.9  CL 91* 94*  CO2 31 31  GLUCOSE 136* 180*  BUN 32* 34*  CREATININE 2.44* 2.01*  CALCIUM 7.4* 7.9*   No results for input(s): PTH in the last 72 hours. Iron Studies: No results for input(s): IRON, TIBC, TRANSFERRIN, FERRITIN in the last 72 hours. Studies/Results: Dg Chest Port 1 View  04/16/2014   CLINICAL DATA:  Respiratory failure.  EXAM: PORTABLE CHEST - 1 VIEW  COMPARISON:  04/15/2014  FINDINGS: Endotracheal tube tip is approximately 3.5 cm above the carina. Right jugular large-bore central venous catheter is stable in positioning in the upper SVC. Left jugular catheter tip is also in the SVC. Lungs show some improvement in aeration on the right with residual interstitial edema remaining as well as lower lung atelectasis. The heart size is stable. Less prominent appearance  of right pleural fluid with probable component of bilateral pleural fluid remaining.  IMPRESSION: Improved aeration of the right lung. Residual interstitial edema with less prominent appearance of right pleural effusion.   Electronically Signed   By: Aletta Edouard M.D.   On: 04/16/2014 08:12   Dg Chest Port 1 View  04/15/2014   CLINICAL DATA:  Acute respiratory failure with hypoxia.  EXAM: PORTABLE CHEST - 1 VIEW  COMPARISON:  April 14, 2014.  FINDINGS: Stable cardiomegaly. No pneumothorax is noted. Increased bilateral pleural effusions are noted with right greater than left. Central pulmonary vascular congestion and perihilar edema is noted. Nasogastric tube is seen passing port stomach. Endotracheal tube is in grossly good position with distal tip 4 cm above the carina. Right internal jugular catheter is noted with distal tip overlying expected position of right innominate vein. Left internal jugular catheter is noted with distal tip in the expected position of the SVC.  IMPRESSION: Stable support apparatus. Continued presence of central pulmonary vascular congestion and bilateral pulmonary edema. Significantly increased bilateral pleural effusions are noted with right greater than left.   Electronically Signed   By: Marijo Conception, M.D.   On: 04/15/2014 07:35    Scheduled: . sodium chloride   Intravenous Once  . antiseptic oral rinse  7 mL Mouth Rinse QID  . ceFEPime (MAXIPIME) IV  2 g Intravenous Q12H  . chlorhexidine  15 mL Mouth Rinse BID  . clotrimazole  Topical BID  . famotidine (PEPCID) IV  20 mg Intravenous Q24H  . feeding supplement (PRO-STAT SUGAR FREE 64)  30 mL Per Tube 5 X Daily  . feeding supplement (VITAL HIGH PROTEIN)  1,000 mL Per Tube Q24H  . hydrocortisone sodium succinate  50 mg Intravenous Q12H  . insulin aspart  1-3 Units Subcutaneous 6 times per day  . insulin glargine  10 Units Subcutaneous Q24H  . sodium chloride  10-40 mL Intracatheter Q12H     LOS: 2 days    William Flores C 04/16/2014,9:42 AM

## 2014-04-16 NOTE — Consult Note (Addendum)
ANTICOAGULATION CONSULT NOTE - Initial Consult  Pharmacy Consult for Heparin  Indication: atrial fibrillation  Allergies  Allergen Reactions  . Codeine     Headache     Patient Measurements: Height: 5' 7"  (170.2 cm) Weight: 295 lb 13.7 oz (134.2 kg) IBW/kg (Calculated) : 66.1 Heparin Dosing Weight: 98.8kg  Vital Signs: Temp: 97.7 F (36.5 C) (04/13 0600) Pulse Rate: 91 (04/13 0600)  Labs:  Recent Labs  04/13/14 2140  04/14/14 0221  04/14/14 0708 04/14/14 1308 04/14/14 1602  04/15/14 0141 04/15/14 0445 04/15/14 1515 04/16/14 0400  HGB 9.8*  < > 9.0*  --   --   --   --   --  11.9* 8.8*  --   --   HCT 36.6*  < > 33.8*  --   --   --   --   --  35.0* 30.8*  --   --   PLT 471*  --  461*  --   --   --   --   --   --  244  --   --   APTT  --   --   --   --  71*  --   --   --   --  53*  --  44*  LABPROT 53.6*  --   --   < >  --   --  32.0*  --   --  26.3*  --  15.4*  INR 5.96*  --   --   < >  --   --  3.08*  --   --  2.39*  --  1.20  CREATININE 5.66*  < > 5.64*  < >  --   --   --   < > 3.10* 2.98* 2.44* 2.01*  TROPONINI  --   --  <0.03  --  0.03 0.22*  --   --   --   --   --   --   < > = values in this interval not displayed.  Estimated Creatinine Clearance: 50.3 mL/min (by C-G formula based on Cr of 2.01).   Medical History: Past Medical History  Diagnosis Date  . Diabetes   . Colon cancer   . Hypertension   . Sleep apnea   . Atrial fibrillation   . Hyperlipidemia   . CHF (congestive heart failure)   . Colon polyps 09/21/2012    Tubular adenoma    Medications:  Scheduled:  . sodium chloride   Intravenous Once  . antiseptic oral rinse  7 mL Mouth Rinse QID  . ceFEPime (MAXIPIME) IV  2 g Intravenous Q12H  . chlorhexidine  15 mL Mouth Rinse BID  . clotrimazole   Topical BID  . famotidine (PEPCID) IV  20 mg Intravenous Q24H  . feeding supplement (PRO-STAT SUGAR FREE 64)  30 mL Per Tube 5 X Daily  . feeding supplement (VITAL HIGH PROTEIN)  1,000 mL Per Tube  Q24H  . hydrocortisone sodium succinate  50 mg Intravenous Q6H  . insulin aspart  1-3 Units Subcutaneous 6 times per day  . insulin glargine  10 Units Subcutaneous Q24H  . sodium chloride  10-40 mL Intracatheter Q12H  . vancomycin  1,250 mg Intravenous Q24H    Assessment: 64yoM  c/o SOB and weakness x3wk, now w/ diarrhea for past 3d, found to be hypotensive, concern for digoxin toxicity and given Digibind; CCM admited w/ cardiogenic shock, ARF, bradycardia, hyperkalemia, severe metabolic acidosis, respiratory failure w/ possible PNA. Patient  with history of atrial fibrillation, on chronic warfarin PTA. INR supratherapeutic on admission at 5.96; warfarin held. INR today 1.2 s/p 2m VitK and FFP on 4/11. Pharmacy consulted to begin heparin when INR<2. Hgb 8.8, plts 244 on 4/12 - no bleeding noted.  Goal of Therapy:  Heparin level 0.3-0.7 units/ml Monitor platelets by anticoagulation protocol: Yes   Plan:  Bolus heparin 4000 units x 1 Begin heparin gtt at 1300 units/hr 6hr HL at 1300 Daily HL/CBC Continue holding warfarin Daily INR/PT Monitor s/sx of bleeding  Thank you for allowing pharmacy to be part of this patient's care team  Kyleigha Markert M. Mica Ramdass, Pharm.D Clinical Pharmacy Resident Pager: [phone]/13/2016 .6:51 AM   ________________________________________________ First heparin level has returned subtherapeutic at 0.15. Heparin levell drawn approximately 1 hour early, but still expect No bleeding noted. No issues with infusion per RN.  Plan: Bolus 2000 units x 1 Increase heparin gtt to 1500 units/hr 6h HL at 2100 Daily HL/CBC Monitor s/sx of bleeding Restart warfarin tonight (see student note)  Thank you for allowing pharmacy to be part of this patient's care team  Moksh Loomer M. Zienna Ahlin, Pharm.D Clinical Pharmacy Resident Pager: [phone]/13/2016 .2:21 PM

## 2014-04-17 ENCOUNTER — Inpatient Hospital Stay (HOSPITAL_COMMUNITY): Payer: BLUE CROSS/BLUE SHIELD

## 2014-04-17 LAB — GLUCOSE, CAPILLARY
GLUCOSE-CAPILLARY: 92 mg/dL (ref 70–99)
Glucose-Capillary: 125 mg/dL — ABNORMAL HIGH (ref 70–99)
Glucose-Capillary: 117 mg/dL — ABNORMAL HIGH (ref 70–99)
Glucose-Capillary: 109 mg/dL — ABNORMAL HIGH (ref 70–99)
Glucose-Capillary: 190 mg/dL — ABNORMAL HIGH (ref 70–99)
Glucose-Capillary: 126 mg/dL — ABNORMAL HIGH (ref 70–99)

## 2014-04-17 LAB — BASIC METABOLIC PANEL
Anion gap: 8 (ref 5–15)
BUN: 27 mg/dL — AB (ref 6–23)
CHLORIDE: 99 mmol/L (ref 96–112)
CO2: 30 mmol/L (ref 19–32)
CREATININE: 1.43 mg/dL — AB (ref 0.50–1.35)
Calcium: 8.3 mg/dL — ABNORMAL LOW (ref 8.4–10.5)
GFR calc Af Amer: 59 mL/min — ABNORMAL LOW (ref >90)
GFR calc non Af Amer: 51 mL/min — ABNORMAL LOW (ref >90)
Glucose, Bld: 119 mg/dL — ABNORMAL HIGH (ref 70–99)
Potassium: 4 mmol/L (ref 3.5–5.1)
SODIUM: 137 mmol/L (ref 135–145)

## 2014-04-17 LAB — CBC
HCT: 30.4 % — ABNORMAL LOW (ref 39.0–52.0)
Hemoglobin: 8.2 g/dL — ABNORMAL LOW (ref 13.0–17.0)
MCH: 25 pg — AB (ref 26.0–34.0)
MCHC: 27 g/dL — ABNORMAL LOW (ref 30.0–36.0)
MCV: 92.7 fL (ref 78.0–100.0)
PLATELETS: 192 10*3/uL (ref 150–400)
RBC: 3.28 MIL/uL — AB (ref 4.22–5.81)
RDW: 23.3 % — ABNORMAL HIGH (ref 11.5–15.5)
WBC: 4.5 10*3/uL (ref 4.0–10.5)

## 2014-04-17 LAB — RENAL FUNCTION PANEL
ALBUMIN: 2.6 g/dL — AB (ref 3.5–5.2)
ANION GAP: 8 (ref 5–15)
ANION GAP: 5 (ref 5–15)
Albumin: 2.7 g/dL — ABNORMAL LOW (ref 3.5–5.2)
BUN: 27 mg/dL — ABNORMAL HIGH (ref 6–23)
BUN: 25 mg/dL — ABNORMAL HIGH (ref 6–23)
CALCIUM: 7.9 mg/dL — AB (ref 8.4–10.5)
CO2: 30 mmol/L (ref 19–32)
CO2: 28 mmol/L (ref 19–32)
CREATININE: 1.4 mg/dL — AB (ref 0.50–1.35)
Calcium: 8.2 mg/dL — ABNORMAL LOW (ref 8.4–10.5)
Chloride: 99 mmol/L (ref 96–112)
Chloride: 103 mmol/L (ref 96–112)
Creatinine, Ser: 1.42 mg/dL — ABNORMAL HIGH (ref 0.50–1.35)
GFR calc Af Amer: 61 mL/min — ABNORMAL LOW (ref >90)
GFR calc non Af Amer: 52 mL/min — ABNORMAL LOW (ref >90)
GFR calc non Af Amer: 51 mL/min — ABNORMAL LOW (ref >90)
GFR, EST AFRICAN AMERICAN: 60 mL/min — AB (ref >90)
GLUCOSE: 196 mg/dL — AB (ref 70–99)
Glucose, Bld: 119 mg/dL — ABNORMAL HIGH (ref 70–99)
PHOSPHORUS: 3.8 mg/dL (ref 2.3–4.6)
PHOSPHORUS: 3.4 mg/dL (ref 2.3–4.6)
Potassium: 4 mmol/L (ref 3.5–5.1)
Potassium: 3.9 mmol/L (ref 3.5–5.1)
SODIUM: 136 mmol/L (ref 135–145)
Sodium: 137 mmol/L (ref 135–145)

## 2014-04-17 LAB — MAGNESIUM: MAGNESIUM: 2.4 mg/dL (ref 1.5–2.5)

## 2014-04-17 LAB — PROTIME-INR
INR: 1.25 (ref 0.00–1.49)
Prothrombin Time: 15.8 seconds — ABNORMAL HIGH (ref 11.6–15.2)

## 2014-04-17 LAB — HEPARIN LEVEL (UNFRACTIONATED)
HEPARIN UNFRACTIONATED: 0.39 [IU]/mL (ref 0.30–0.70)
Heparin Unfractionated: 0.22 IU/mL — ABNORMAL LOW (ref 0.30–0.70)

## 2014-04-17 LAB — APTT

## 2014-04-17 MED ORDER — PRO-STAT SUGAR FREE PO LIQD
30.0000 mL | Freq: Every day | ORAL | Status: DC
  Filled 2014-04-17 (×4): qty 30

## 2014-04-17 MED ORDER — FENTANYL CITRATE (PF) 100 MCG/2ML IJ SOLN
12.5000 ug | INTRAMUSCULAR | Status: DC | PRN
  Administered 2014-04-19 – 2014-04-20 (×3): 25 ug via INTRAVENOUS

## 2014-04-17 MED ORDER — INSULIN ASPART 100 UNIT/ML ~~LOC~~ SOLN
0.0000 [IU] | Freq: Three times a day (TID) | SUBCUTANEOUS | Status: DC
  Administered 2014-04-18: 1 [IU] via SUBCUTANEOUS
  Administered 2014-04-19 (×2): 2 [IU] via SUBCUTANEOUS
  Administered 2014-04-20 – 2014-04-21 (×3): 1 [IU] via SUBCUTANEOUS
  Administered 2014-04-22: 2 [IU] via SUBCUTANEOUS
  Administered 2014-04-23: 1 [IU] via SUBCUTANEOUS

## 2014-04-17 MED ORDER — WARFARIN SODIUM 5 MG PO TABS
5.0000 mg | ORAL_TABLET | ORAL | Status: AC
  Administered 2014-04-17: 5 mg via ORAL
  Filled 2014-04-17: qty 1

## 2014-04-17 MED ORDER — HEPARIN (PORCINE) IN NACL 100-0.45 UNIT/ML-% IJ SOLN
1650.0000 [IU]/h | INTRAMUSCULAR | Status: DC
  Administered 2014-04-17 (×2): 1500 [IU]/h via INTRAVENOUS
  Filled 2014-04-17 (×2): qty 250

## 2014-04-17 MED ORDER — INSULIN ASPART 100 UNIT/ML ~~LOC~~ SOLN: 0.0000 [IU] | Freq: Every day | SUBCUTANEOUS | Status: DC

## 2014-04-17 MED ORDER — CETYLPYRIDINIUM CHLORIDE 0.05 % MT LIQD
7.0000 mL | Freq: Two times a day (BID) | OROMUCOSAL | Status: DC
  Administered 2014-04-18 – 2014-04-23 (×11): 7 mL via OROMUCOSAL

## 2014-04-17 MED ORDER — METOPROLOL TARTRATE 25 MG PO TABS
25.0000 mg | ORAL_TABLET | Freq: Three times a day (TID) | ORAL | Status: DC
  Administered 2014-04-17 – 2014-04-23 (×18): 25 mg via ORAL
  Filled 2014-04-17 (×20): qty 1

## 2014-04-17 NOTE — Progress Notes (Signed)
ANTICOAGULATION CONSULT NOTE - Follow Up Consult ANTIBIOTIC CONSULT NOTE - Follow up [place] for heparin and cefepime Indication: atrial fibrillation and PNA  Allergies  Allergen Reactions  . Codeine     Headache     Patient Measurements: Height: 5' 7"  (170.2 cm) Weight: 287 lb 11.2 oz (130.5 kg) IBW/kg (Calculated) : 66.1 Heparin Dosing Weight: 98.8kg  Vital Signs: Temp: 97 F (36.1 C) (04/14 1400) Temp Source: Core (Comment) (04/14 0800) BP: 130/56 mmHg (04/14 1400) Pulse Rate: 63 (04/14 1400)  Labs:  Recent Labs  04/15/14 0141 04/15/14 0445  04/16/14 0400 04/16/14 1145 04/16/14 1509 04/16/14 2030 04/17/14 0430  HGB 11.9* 8.8*  --   --   --   --   --  8.2*  HCT 35.0* 30.8*  --   --   --   --   --  30.4*  PLT  --  244  --   --   --   --   --  192  APTT  --  53*  --  44*  --   --   --  >200*  LABPROT  --  26.3*  --  15.4*  --   --   --  15.8*  INR  --  2.39*  --  1.20  --   --   --  1.25  HEPARINUNFRC  --   --   --   --  0.15*  --  0.47 0.39  CREATININE 3.10* 2.98*  < > 2.01*  --  1.69*  --  1.43*  1.40*  < > = values in this interval not displayed.  Estimated Creatinine Clearance: 71.1 mL/min (by C-G formula based on Cr of 1.4).   Medications:  Scheduled:  . sodium chloride   Intravenous Once  . clotrimazole   Topical BID  . famotidine (PEPCID) IV  20 mg Intravenous Q24H  . feeding supplement (PRO-STAT SUGAR FREE 64)  30 mL Per Tube 5 X Daily  . insulin aspart  1-3 Units Subcutaneous 6 times per day  . insulin glargine  10 Units Subcutaneous Q24H  . metoprolol tartrate  25 mg Oral Q8H  . sodium chloride  10-40 mL Intracatheter Q12H  . Warfarin - Pharmacist Dosing Inpatient   Does not apply q1800   Infusions:  . sodium chloride Stopped (04/15/14 0700)  . heparin Stopped (04/17/14 0700)  . norepinephrine (LEVOPHED) Adult infusion Stopped (04/16/14 0726)  . dialysis replacement fluid (prismasate) 200 mL/hr at 04/17/14 1156  .  dialysis replacement fluid (prismasate) 200 mL/hr at 04/16/14 1937  . dialysate (PRISMASATE) 2,000 mL/hr at 04/17/14 1423    Assessment: 8 YOM w/ hx of afib, on chronic warfarin PTA. INR supratherapeutic on admission at 5.96; warfarin held and heparin gtt initiated 4/13 when INR dropped to 1.2 s/p 1m VitK and FFP on 4/11. Pt extubated 4/12 with the plan to resume warfarin however the patient did not pass the swallow eval. Heparin therapeutic x 2>> AM HL 0.39 on 1500units/hr. Hgb 8.2, plts 192 - no bleeding noted Pt oozing from central line site this am heparin drip turned off at 0700 - no bleeding now ok with MD to restart heparin  Pt also passed swallow eval - dysphgia 2 - will order warfarin dose tonight   Patient also continued on D#3 of abx for PNA.Started on both vanc/cefepime. Vanc discontinued 4/13. MD wants a total of 5 days of therapy. WBC 4.5, T 96.8. Patient on CRRT - medications appropriately  adjusted.   Vanc 4/11>>4/13 Cefepime 4/11>>   4/11 BCx>>ngtd  Goal of Therapy:  Heparin level 0.3-0.7 units/ml Monitor platelets by anticoagulation protocol: Yes   Plan:  Restart heparin at 1500 units/hr HL 2200 Daily HL/CBC Warfarin 48m x1 Daily INR/PT Monitor s/sx of bleeding Continue Cefepime 2g q12h (stop date entered) Monitor cultures and clinical progress  LBonnita NasutiPharm.D. CPP, BCPS Clinical Pharmacist [phone]/14/2016 2:54 PM

## 2014-04-17 NOTE — Progress Notes (Signed)
ANTICOAGULATION CONSULT NOTE - Follow Up Consult ANTIBIOTIC CONSULT NOTE - Follow up [place] for heparin and cefepime Indication: atrial fibrillation and PNA  Allergies  Allergen Reactions  . Codeine     Headache     Patient Measurements: Height: 5' 7"  (170.2 cm) Weight: 287 lb 11.2 oz (130.5 kg) IBW/kg (Calculated) : 66.1 Heparin Dosing Weight: 98.8kg  Vital Signs: Temp: 96.8 F (36 C) (04/14 0600) Pulse Rate: 83 (04/14 0600)  Labs:  Recent Labs  04/14/14 0708 04/14/14 1308  04/15/14 0141 04/15/14 0445  04/16/14 0400 04/16/14 1145 04/16/14 1509 04/16/14 2030 04/17/14 0430  HGB  --   --   < > 11.9* 8.8*  --   --   --   --   --  8.2*  HCT  --   --   --  35.0* 30.8*  --   --   --   --   --  30.4*  PLT  --   --   --   --  244  --   --   --   --   --  192  APTT 71*  --   --   --  53*  --  44*  --   --   --   --   LABPROT  --   --   < >  --  26.3*  --  15.4*  --   --   --  15.8*  INR  --   --   < >  --  2.39*  --  1.20  --   --   --  1.25  HEPARINUNFRC  --   --   --   --   --   --   --  0.15*  --  0.47 0.39  CREATININE  --   --   < > 3.10* 2.98*  < > 2.01*  --  1.69*  --  1.43*  1.40*  TROPONINI 0.03 0.22*  --   --   --   --   --   --   --   --   --   < > = values in this interval not displayed.  Estimated Creatinine Clearance: 71.1 mL/min (by C-G formula based on Cr of 1.4).   Medications:  Scheduled:  . sodium chloride   Intravenous Once  . ceFEPime (MAXIPIME) IV  2 g Intravenous Q12H  . clotrimazole   Topical BID  . famotidine (PEPCID) IV  20 mg Intravenous Q24H  . feeding supplement (PRO-STAT SUGAR FREE 64)  30 mL Per Tube 5 X Daily  . hydrocortisone sodium succinate  50 mg Intravenous Q12H  . insulin aspart  1-3 Units Subcutaneous 6 times per day  . insulin glargine  10 Units Subcutaneous Q24H  . sodium chloride  10-40 mL Intracatheter Q12H  . Warfarin - Pharmacist Dosing Inpatient   Does not apply q1800   Infusions:  . sodium  chloride Stopped (04/15/14 0700)  . heparin 1,500 Units/hr (04/16/14 1937)  . norepinephrine (LEVOPHED) Adult infusion Stopped (04/16/14 0726)  . dialysis replacement fluid (prismasate) 200 mL/hr at 04/16/14 1024  . dialysis replacement fluid (prismasate) 200 mL/hr at 04/16/14 1937  . dialysate (PRISMASATE) 2,000 mL/hr at 04/17/14 0435    Assessment: 10 YOM w/ hx of afib, on chronic warfarin PTA. INR supratherapeutic on admission at 5.96; warfarin held and heparin gtt initiated 4/13 when INR dropped to 1.2 s/p 41m VitK and FFP on 4/11. Pt  extubated 4/12 with the plan to resume warfarin however the patient did not pass the swallow eval. Heparin therapeutic x 2>> AM HL 0.39 on 1500units/hr. Hgb 8.2, plts 192 - no bleeding noted  Patient also continued on D#3 of abx for PNA.Started on both vanc/cefepime. Vanc discontinued 4/13. MD wants a total of 5 days of therapy. WBC 4.5, T 96.8. Patient on CRRT - medications appropriately adjusted.   Vanc 4/11>>4/13 Cefepime 4/11>>   4/11 BCx>>ngtd  Goal of Therapy:  Heparin level 0.3-0.7 units/ml Monitor platelets by anticoagulation protocol: Yes   Plan:  Continue heparin at 1500 units/hr Daily HL/CBC Daily INR/PT F/u restart warfarin when passes swallow eval Monitor s/sx of bleeding Continue Cefepime 2g q12h (stop date entered) Monitor cultures and clinical progress  Thank you for allowing pharmacy to be part of this patient's care team  Sandip Power M. Morgan Rennert, Pharm.D Clinical Pharmacy Resident Pager: [phone] 04/17/2014 .6:53 AM

## 2014-04-17 NOTE — Progress Notes (Signed)
PT Cancellation Note  Patient Details Name: William Flores MRN: [id number] DOB: 03-02-1950   Cancelled Treatment:    Reason Eval/Treat Not Completed: Medical issues which prohibited therapy (Pt with elevated PTT with CVVHD with oozing from lines and RN request defer at this time)   Melford Aase 04/17/2014, 7:47 AM Elwyn Reach, [place]

## 2014-04-17 NOTE — Progress Notes (Signed)
Assessment: 1. Acute kidney injury - d/t shock   Plan - cont UF on CVVHD today, but if system clots will dc; otherwise, will plan to stop on Friday  Subjective: Interval History: Extubated, making some urine  Objective: Vital signs in last 24 hours: Temp:  [96.4 F (35.8 C)-97.7 F (36.5 C)] 96.6 F (35.9 C) (04/14 0800) Pulse Rate:  [69-118] 86 (04/14 0800) Resp:  [11-26] 12 (04/14 0800) BP: (119)/(66) 119/66 mmHg (04/14 0800) SpO2:  [71 %-100 %] 100 % (04/14 0800) Weight:  [130.5 kg (287 lb 11.2 oz)] 130.5 kg (287 lb 11.2 oz) (04/14 0500) Weight change: -3.7 kg (-8 lb 2.5 oz)  Intake/Output from previous day: 04/13 0701 - 04/14 0700 In: 903.6 [I.V.:353.6; NG/GT:150; IV Piggyback:400] Out: 3150 [Urine:375] Intake/Output this shift: Total I/O In: 36 [Other:36] Out: 199 [Urine:45; Other:154]  General appearance: arouses, sedated some, appropriate GI: soft, non-tender; bowel sounds normal; no masses,  no organomegaly and protuberant Extremities: edema tr to 1+  Lab Results:  Recent Labs  04/15/14 0445 04/17/14 0430  WBC 6.8 4.5  HGB 8.8* 8.2*  HCT 30.8* 30.4*  PLT 244 192   BMET:  Recent Labs  04/16/14 1509 04/17/14 0430  NA 137 137  137  K 4.0 4.0  4.0  CL 99 99  99  CO2 32 30  30  GLUCOSE 149* 119*  119*  BUN 33* 27*  27*  CREATININE 1.69* 1.43*  1.40*  CALCIUM 8.3* 8.3*  8.2*   No results for input(s): PTH in the last 72 hours. Iron Studies: No results for input(s): IRON, TIBC, TRANSFERRIN, FERRITIN in the last 72 hours. Studies/Results: Dg Chest Port 1 View  04/17/2014   CLINICAL DATA:  Acute respiratory failure. Hypoxia. Follow-up exam.  EXAM: PORTABLE CHEST - 1 VIEW  COMPARISON:  04/16/2014.  FINDINGS: Right greater than left lung interstitial airspace opacities are mildly improved from the previous day's study. No new lung opacities.  Right internal jugular dual-lumen central venous line and left internal jugular single-lumen central venous  line are stable.  Endotracheal tube and nasogastric tube have been removed.  No pneumothorax.  IMPRESSION: 1. Mild improvement interstitial airspace opacities when compared to the previous day's study. 2. Status post removal of the endotracheal tube and nasogastric tube. 3. No other changes.   Electronically Signed   By: Lajean Manes M.D.   On: 04/17/2014 07:48   Dg Chest Port 1 View  04/16/2014   CLINICAL DATA:  Respiratory failure.  EXAM: PORTABLE CHEST - 1 VIEW  COMPARISON:  04/15/2014  FINDINGS: Endotracheal tube tip is approximately 3.5 cm above the carina. Right jugular large-bore central venous catheter is stable in positioning in the upper SVC. Left jugular catheter tip is also in the SVC. Lungs show some improvement in aeration on the right with residual interstitial edema remaining as well as lower lung atelectasis. The heart size is stable. Less prominent appearance of right pleural fluid with probable component of bilateral pleural fluid remaining.  IMPRESSION: Improved aeration of the right lung. Residual interstitial edema with less prominent appearance of right pleural effusion.   Electronically Signed   By: Aletta Edouard M.D.   On: 04/16/2014 08:12   Scheduled: . sodium chloride   Intravenous Once  . ceFEPime (MAXIPIME) IV  2 g Intravenous Q12H  . clotrimazole   Topical BID  . famotidine (PEPCID) IV  20 mg Intravenous Q24H  . feeding supplement (PRO-STAT SUGAR FREE 64)  30 mL Per Tube 5 X  Daily  . hydrocortisone sodium succinate  50 mg Intravenous Q12H  . insulin aspart  1-3 Units Subcutaneous 6 times per day  . insulin glargine  10 Units Subcutaneous Q24H  . metoprolol tartrate  25 mg Oral Q8H  . sodium chloride  10-40 mL Intracatheter Q12H  . Warfarin - Pharmacist Dosing Inpatient   Does not apply q1800      LOS: 3 days   William Flores C 04/17/2014,8:45 AM

## 2014-04-17 NOTE — Progress Notes (Signed)
[name] DOB: 1950-08-25 MRN: [id number]   Date of Service  04/17/2014  HPI/Events of Note  Patient now off of enteral feeds and on a PO diet.   eICU Interventions  Will order: 1. D/C Lantus Q PM. 2. Change sensitive Novolog SSI from Q 4 hours to AC/HS.     Intervention Category Intermediate Interventions: Hyperglycemia - evaluation and treatment  Bowdy Bair Cornelia Copa 04/17/2014, 8:39 PM

## 2014-04-17 NOTE — Progress Notes (Signed)
Patient ID: William Flores, male   DOB: 02-Jan-1951, 64 y.o.   MRN: [id number]   SUBJECTIVE: Extubated and off all pressors.  Remains on CVVH.  He does not remember much detail about the time immediately prior to hospitalization.  Says he was having loose stool 3-4 times a day for 3 days and was profoundly weak.     Scheduled Meds: . sodium chloride   Intravenous Once  . ceFEPime (MAXIPIME) IV  2 g Intravenous Q12H  . clotrimazole   Topical BID  . famotidine (PEPCID) IV  20 mg Intravenous Q24H  . feeding supplement (PRO-STAT SUGAR FREE 64)  30 mL Per Tube 5 X Daily  . hydrocortisone sodium succinate  50 mg Intravenous Q12H  . insulin aspart  1-3 Units Subcutaneous 6 times per day  . insulin glargine  10 Units Subcutaneous Q24H  . sodium chloride  10-40 mL Intracatheter Q12H  . Warfarin - Pharmacist Dosing Inpatient   Does not apply q1800   Continuous Infusions: . sodium chloride Stopped (04/15/14 0700)  . heparin 1,500 Units/hr (04/16/14 1937)  . norepinephrine (LEVOPHED) Adult infusion Stopped (04/16/14 0726)  . dialysis replacement fluid (prismasate) 200 mL/hr at 04/16/14 1024  . dialysis replacement fluid (prismasate) 200 mL/hr at 04/16/14 1937  . dialysate (PRISMASATE) 2,000 mL/hr at 04/17/14 0435   PRN Meds:.sodium chloride, fentaNYL, heparin, heparin, sodium chloride, sodium chloride    Filed Vitals:   04/17/14 0400 04/17/14 0500 04/17/14 0600 04/17/14 0700  BP:      Pulse: 84 90 83 104  Temp: 96.8 F (36 C) 96.8 F (36 C) 96.8 F (36 C) 96.6 F (35.9 C)  TempSrc:      Resp: 16 18 17 26   Height:      Weight:  287 lb 11.2 oz (130.5 kg)    SpO2: 99% 100% 71% 92%    Intake/Output Summary (Last 24 hours) at 04/17/14 0740 Last data filed at 04/17/14 0600  Gross per 24 hour  Intake 888.55 ml  Output   3067 ml  Net -2178.45 ml    LABS: Basic Metabolic Panel:  Recent Labs  04/16/14 0400 04/16/14 1509 04/17/14 0430  NA 135 137 137  137  K 3.9 4.0 4.0  4.0    CL 94* 99 99  99  CO2 31 32 30  30  GLUCOSE 180* 149* 119*  119*  BUN 34* 33* 27*  27*  CREATININE 2.01* 1.69* 1.43*  1.40*  CALCIUM 7.9* 8.3* 8.3*  8.2*  MG 2.3  --  2.4  PHOS 4.8* 4.6 3.8   Liver Function Tests:  Recent Labs  04/16/14 1509 04/17/14 0430  ALBUMIN 2.7* 2.6*   No results for input(s): LIPASE, AMYLASE in the last 72 hours. CBC:  Recent Labs  04/15/14 0445 04/17/14 0430  WBC 6.8 4.5  NEUTROABS 6.0  --   HGB 8.8* 8.2*  HCT 30.8* 30.4*  MCV 87.7 92.7  PLT 244 192   Cardiac Enzymes:  Recent Labs  04/14/14 1308  TROPONINI 0.22*   BNP: Invalid input(s): POCBNP D-Dimer: No results for input(s): DDIMER in the last 72 hours. Hemoglobin A1C: No results for input(s): HGBA1C in the last 72 hours. Fasting Lipid Panel: No results for input(s): CHOL, HDL, LDLCALC, TRIG, CHOLHDL, LDLDIRECT in the last 72 hours. Thyroid Function Tests: No results for input(s): TSH, T4TOTAL, T3FREE, THYROIDAB in the last 72 hours.  Invalid input(s): FREET3 Anemia Panel: No results for input(s): VITAMINB12, FOLATE, FERRITIN, TIBC, IRON, RETICCTPCT in the  last 72 hours.  RADIOLOGY: Dg Chest Port 1 View  04/16/2014   CLINICAL DATA:  Respiratory failure.  EXAM: PORTABLE CHEST - 1 VIEW  COMPARISON:  04/15/2014  FINDINGS: Endotracheal tube tip is approximately 3.5 cm above the carina. Right jugular large-bore central venous catheter is stable in positioning in the upper SVC. Left jugular catheter tip is also in the SVC. Lungs show some improvement in aeration on the right with residual interstitial edema remaining as well as lower lung atelectasis. The heart size is stable. Less prominent appearance of right pleural fluid with probable component of bilateral pleural fluid remaining.  IMPRESSION: Improved aeration of the right lung. Residual interstitial edema with less prominent appearance of right pleural effusion.   Electronically Signed   By: Aletta Edouard M.D.   On:  04/16/2014 08:12   Dg Chest Port 1 View  04/15/2014   CLINICAL DATA:  Acute respiratory failure with hypoxia.  EXAM: PORTABLE CHEST - 1 VIEW  COMPARISON:  April 14, 2014.  FINDINGS: Stable cardiomegaly. No pneumothorax is noted. Increased bilateral pleural effusions are noted with right greater than left. Central pulmonary vascular congestion and perihilar edema is noted. Nasogastric tube is seen passing port stomach. Endotracheal tube is in grossly good position with distal tip 4 cm above the carina. Right internal jugular catheter is noted with distal tip overlying expected position of right innominate vein. Left internal jugular catheter is noted with distal tip in the expected position of the SVC.  IMPRESSION: Stable support apparatus. Continued presence of central pulmonary vascular congestion and bilateral pulmonary edema. Significantly increased bilateral pleural effusions are noted with right greater than left.   Electronically Signed   By: Marijo Conception, M.D.   On: 04/15/2014 07:35   Dg Chest Port 1 View  04/14/2014   CLINICAL DATA:  Central line placement.  EXAM: PORTABLE CHEST - 1 VIEW  COMPARISON:  Chest x-ray from the same day at 00:38  FINDINGS: New right IJ central line, tip near the right IJ/ brachiocephalic confluence. A left IJ central line is in similar position, tip at the upper SVC. The endotracheal tube and orogastric tube remains in good position.  Unchanged cardiomegaly and upper mediastinal widening. There is diffuse interstitial and airspace opacity consistent with pulmonary edema. Layering pleural effusions are likely. No pneumothorax.  IMPRESSION: 1. New right neck central line, tip near the right IJ/ brachiocephalic vein confluence. No pneumothorax. 2. Pre-existing hardware is in unchanged position. 3. Unchanged pulmonary edema.   Electronically Signed   By: Monte Fantasia M.D.   On: 04/14/2014 05:32   Dg Chest Portable 1 View  04/14/2014   CLINICAL DATA:  Endotracheal tube  placement.  EXAM: PORTABLE CHEST - 1 VIEW  COMPARISON:  Earlier this day at 0008 hr  FINDINGS: Endotracheal tube 3.3 cm from the carina. Enteric tube in place, mid and distal portions not well seen, tip obscure due to multiple overlying artifacts. Tip of the left central line in the SVC. Cardiomegaly is grossly stable. Bilateral perihilar opacities, minimally improved in the right lung. Bilateral pleural effusions are unchanged.  IMPRESSION: 1. Endotracheal tube 3.3 cm from the carina. 2. Enteric tube in place, however not well visualized beyond the mid distal esophagus due to overlying monitoring devices.   Electronically Signed   By: Jeb Levering M.D.   On: 04/14/2014 01:56   Dg Chest Portable 1 View  04/14/2014   CLINICAL DATA:  Shortness of breath.  Central line placement  EXAM: PORTABLE  CHEST - 1 VIEW  COMPARISON:  04/13/2014  FINDINGS: New left IJ catheter, tip at the upper SVC. No indication of pneumothorax.  Progressive bilateral lung opacification. There are layering pleural effusions. Stable cardiopericardial enlargement and vascular pedicle widening.  IMPRESSION: 1. New left IJ catheter is in good position. No visible pneumothorax. 2. Progressive CHF.   Electronically Signed   By: Monte Fantasia M.D.   On: 04/14/2014 00:58   Dg Chest Portable 1 View  04/13/2014   CLINICAL DATA:  Shortness of breath and bradycardia  EXAM: PORTABLE CHEST - 1 VIEW  COMPARISON:  02/20/2014  FINDINGS: Chronic cardiomegaly, with size accentuated by increase in lower mediastinal fat on recent CT. There is widening of the vascular pedicle which is also also stable.  Haziness of the lower chest compatible with pleural effusions and atelectasis. There is pulmonary venous congestion and probable basilar alveolar edema.  IMPRESSION: CHF pattern.   Electronically Signed   By: Monte Fantasia M.D.   On: 04/13/2014 22:15   Dg Abd Portable 1v  04/14/2014   CLINICAL DATA:  Metabolic acidosis.  EXAM: PORTABLE ABDOMEN - 1 VIEW   COMPARISON:  02/19/2014  FINDINGS: There is a paucity of gas throughout the abdomen. No convincing evidence for bowel obstruction. NG tube is in the stomach. No free air on this portable supine study. No visible organomegaly or suspicious calcification.  IMPRESSION: Nonspecific bowel gas pattern with relative paucity of gas. No evidence of obstruction.   Electronically Signed   By: Rolm Baptise M.D.   On: 04/14/2014 09:19    PHYSICAL EXAM General: NAD Neck: Thick, JVP 10 cm, no thyromegaly or thyroid nodule.  Lungs: Clear to auscultation bilaterally with normal respiratory effort. CV: Nondisplaced PMI.  Heart regular S1/S2, no S3/S4, no murmur. 1+ ankle edema.   Abdomen: Soft, nontender, no hepatosplenomegaly, no distention.  Neurologic: Alert/oriented Psych: Normal affect. Extremities: No clubbing or cyanosis.   TELEMETRY: Reviewed telemetry pt in atrial fibrillation, 80s-90s.  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   1. Hypotension: Echo showed EF 65% with moderately dilated and mildly dysfunctional RV, co-ox has been normal.  Difficult situation to figure out exactly what happened. I suspect that his RV may be abnormal due to history of OHS/OSA, doubt PE with ongoing warfarin and INR > 5 at admission. I do not think this is cardiogenic shock based on echo and co-ox (epinephrine/milrinone may have been making his EF look better but I do not think he would have particularly bad LV function at baseline). His procalcitonin and WBC count have not been particularly high, so no definite sign of infection. I wonder if the initial insult was not some form of renal insult with AKI and metabolic derangement, with profound acidosis and hyperkalemia accounting for initial presentation with bradycardia, hypotension, and obtundation.  However, I am unsure what initial renal insult would have been. There is apparently a history of diarrhea for a number of days.  Hypotension resolved. 2. AKI: Renal following, CVVH.  Ultrafiltration ongoing 100 cc/hr. He is making some urine.  3. Acute hypoxemic respiratory failure: Pulmonary edema in setting of AKI.  Will need fluid off via CVVH. Now extubated.  4. Chronic atrial fibrillation: Back on anticoagulation.  BP up, will add metoprolol 25 every 8 hrs.    Loralie Champagne 04/17/2014 7:40 AM

## 2014-04-17 NOTE — Progress Notes (Signed)
Speech Language Pathology Treatment: Dysphagia  Patient Details Name: William Flores MRN: [id number] DOB: 01-24-1950 Today's Date: 04/17/2014 Time: 2831-5176 SLP Time Calculation (min) (ACUTE ONLY): 26 min  Assessment / Plan / Recommendation Clinical Impression  Pt with greatly improved vocal quality today, still hoarse and with low vocal intensity but with Min cues he is able to increase volume. Pt has intermittent, immediate throat clearing following sips of thin liquids, and delayed, dry throat clearing throughout additional trials that he reports happens at baseline (pt with h/o reflux). Oral phase and mastication appear WFL. Recommend to initiate Dys 2 diet for energy conservation with small sips of nectar thick liquids. Will follow for tolerance and readiness to advance.   HPI HPI: 64 y/o male came to the [facility] ED on 4/10 by EMS for "low oxygen and trouble breathing". He was found to be in cardiogenic shock, bradycardic, in acute renal failure, hyperkalemic, and with lactic acidosis. Pt was intubated 4/10-4/13.   Pertinent Vitals Pain Assessment: No/denies pain  SLP Plan  Goals updated    Recommendations Diet recommendations: Dysphagia 2 (fine chop);Nectar-thick liquid Liquids provided via: Cup;Straw Medication Administration: Whole meds with puree Supervision: Staff to assist with self feeding;Full supervision/cueing for compensatory strategies Compensations: Slow rate;Small sips/bites;Follow solids with liquid Postural Changes and/or Swallow Maneuvers: Seated upright 90 degrees;Upright 30-60 min after meal       Oral Care Recommendations: Oral care BID Follow up Recommendations:  (tba) Plan: Goals updated    Germain Osgood, M.A. CCC-SLP [phone]  Germain Osgood 04/17/2014, 11:48 AM

## 2014-04-17 NOTE — Progress Notes (Signed)
PULMONARY / CRITICAL CARE MEDICINE   Name: William Flores MRN: [id number] DOB: 03/21/1950    ADMISSION DATE:  04/13/2014 CONSULTATION DATE:  04/13/14  REFERRING MD :  Ashok Cordia  CHIEF COMPLAINT:  Shortness of breath  INITIAL PRESENTATION: 64 y/o male came to the [facility] ED on 4/10 by EMS for "low oxygen and trouble breathing".  He was found to be in cardiogenic shock, bradycardic, in acute renal failure, hyperkalemic, and with lactic acidosis.   STUDIES:  4/11 KUB: non-obstructive gas pattern 4/11 Echo: EF 65-70%, no WMA, RV and RA severely dilated, PA pressure 32mHg  SIGNIFICANT EVENTS: 4/11: cardiogenic shock, multiple pressors, intubated 4/13: Off pressors  4/13: Extubated  HISTORY OF PRESENT ILLNESS:  64[facility] "low oxygen and trouble breathing".  He was found to be in cardiogenic shock, bradycardic, in acute renal failure, hyperkalemic, and in a severe metabolic acidosis. There was no family to provide history and the patient was encephalopathic so no further history could be obtained.   SUBJECTIVE: [name] off of vasopressors and continues to do well post extubation.   VITAL SIGNS: Temp:  [96.4 F (35.8 C)-98.1 F (36.7 C)] 96.8 F (36 C) (04/14 0600) Pulse Rate:  [69-118] 83 (04/14 0600) Resp:  [11-22] 17 (04/14 0600) BP: (95)/(64) 95/64 mmHg (04/13 0735) SpO2:  [71 %-100 %] 71 % (04/14 0600) FiO2 (%):  [50 %] 50 % (04/13 0735) Weight:  [287 lb 11.2 oz (130.5 kg)] 287 lb 11.2 oz (130.5 kg) (04/14 0500) HEMODYNAMICS: CVP:  [15 mmHg-18 mmHg] 18 mmHg VENTILATOR SETTINGS: Vent Mode:  [-] CPAP;PSV FiO2 (%):  [50 %] 50 % PEEP:  [5 cmH20] 5 cmH20 Pressure Support:  [10 cmH20] 10 cmH20 INTAKE / OUTPUT:  Intake/Output Summary (Last 24 hours) at 04/17/14 05462Last data filed at 04/17/14 0600  Gross per 24 hour  Intake 888.55 ml  Output   3067 ml  Net -2178.45 ml    PHYSICAL EXAMINATION: Gen: obese, comfortable  HENT:  NCAT, nasal canula in place, neck supple w/ mild oozing from L IJ  Eyes: EOMi PULM: Few crackles on right, clear breath sounds on left, non-labored breathing CV: Irreg irreg,distant heart sounds, cannot assess JVD GI: BS+, soft, nontender MSK: slightly decreased bulk, normal tone Neuro: Awake and alert on vent, answers questions appropriately, follows commands   LABS:  CBC  Recent Labs Lab 04/14/14 0221 04/15/14 0141 04/15/14 0445 04/17/14 0430  WBC 11.8*  --  6.8 4.5  HGB 9.0* 11.9* 8.8* 8.2*  HCT 33.8* 35.0* 30.8* 30.4*  PLT 461*  --  244 192   Coag's  Recent Labs Lab 04/15/14 0445 04/16/14 0400 04/17/14 0430  APTT 53* 44* >200*  INR 2.39* 1.20 1.25   BMET  Recent Labs Lab 04/16/14 0400 04/16/14 1509 04/17/14 0430  NA 135 137 137  137  K 3.9 4.0 4.0  4.0  CL 94* 99 99  99  CO2 31 32 30  30  BUN 34* 33* 27*  27*  CREATININE 2.01* 1.69* 1.43*  1.40*  GLUCOSE 180* 149* 119*  119*   Electrolytes  Recent Labs Lab 04/14/14 0708  04/16/14 0400 04/16/14 1509 04/17/14 0430  CALCIUM  --   < > 7.9* 8.3* 8.3*  8.2*  MG 2.0  --  2.3  --  2.4  PHOS  --   < > 4.8* 4.6 3.8  < > = values in this interval not displayed.  Sepsis Markers  Recent Labs Lab 04/14/14 0213 04/14/14 0221 04/14/14 0710 04/14/14 1310 04/15/14 0445 04/16/14 0400  LATICACIDVEN 9.6*  --  11.6* 10.1*  --   --   PROCALCITON  --  0.11  --   --  0.63 0.57   ABG  Recent Labs Lab 04/15/14 1042 04/15/14 1100 04/15/14 1543  PHART 7.429 PENDING 7.335*  PCO2ART 51.0* PENDING 66.0*  PO2ART 143.0* PENDING 90.0   Liver Enzymes  Recent Labs Lab 04/13/14 2140  04/16/14 0400 04/16/14 1509 04/17/14 0430  AST 26  --   --   --   --   ALT 20  --   --   --   --   ALKPHOS 69  --   --   --   --   BILITOT 0.6  --   --   --   --   ALBUMIN 3.1*  < > 2.7* 2.7* 2.6*  < > = values in this interval not displayed. Cardiac Enzymes  Recent Labs Lab 04/14/14 0221 04/14/14 0708  04/14/14 1308  TROPONINI <0.03 0.03 0.22*   Glucose  Recent Labs Lab 04/16/14 0740 04/16/14 1146 04/16/14 1508 04/16/14 2037 04/17/14 0015 04/17/14 0430  GLUCAP 163* 185* 143* 114* 125* 117*    Imaging Dg Chest Port 1 View  04/16/2014   CLINICAL DATA:  Respiratory failure.  EXAM: PORTABLE CHEST - 1 VIEW  COMPARISON:  04/15/2014  FINDINGS: Endotracheal tube tip is approximately 3.5 cm above the carina. Right jugular large-bore central venous catheter is stable in positioning in the upper SVC. Left jugular catheter tip is also in the SVC. Lungs show some improvement in aeration on the right with residual interstitial edema remaining as well as lower lung atelectasis. The heart size is stable. Less prominent appearance of right pleural fluid with probable component of bilateral pleural fluid remaining.  IMPRESSION: Improved aeration of the right lung. Residual interstitial edema with less prominent appearance of right pleural effusion.   Electronically Signed   By: Aletta Edouard M.D.   On: 04/16/2014 08:12     ASSESSMENT / PLAN:  PULMONARY OETT 4/11 >>4/13 A: Acute respiratory failure due to acute pulmonary edema vs HCAP> improving with volume removal, extubated 4/13 History of stridor post extubation last hospital stay P:   - See ID - See cardiology - Continue supplemental O2 as needed  CARDIOVASCULAR CVL L IJ 4/11 >> A: Acute circulatory shock, improving, felt to be due to acidosis Chronic systolic heart failure > echo showed improved LVEF Bradycardia due to metabolic acidosis > resolved Chronic atrial fibriliation on warfarin, rate controlled  P:  - Appreciate heart failure team - Continue holding home BP medications (metoprolol, lisinopril) - MAP goal > 65  RENAL A:  Acute kidney injury, uncertain etiology > diarrhea? Lack of forward flow? Metabolic acidosis> resolved Hyperkalemia> resolved P:   - CVVHD @100 , appreciate Nephrology  recommendations  GASTROINTESTINAL A:   Diarrhea > resolved Failed SLP swallow, likely due to fatigue P:   - Repeat SLP swallow today - Sugar-free tube feedings for nutrition - Pepcid for SUP  HEMATOLOGIC A:   Coagulopathy due to warfarin, INR >5 on admit, now resolved Supratherapeutic PTT with oozing from IV site P:  - Holding Heparin - Warfarin per pharm - Monitor for bleeding  INFECTIOUS A:  Still unclear if he was infected on admission, favor that this was all due to acidosis; treating for HCAP but little evidence for this.  BCx2 4/11 Negative  Candidial  intertrigo   P:   - Abx: vanc, start date 4/11 > 4/13 - Abx Cefepime, start date 4/11 > stop after 5 days - Topical clotrimazole for groin bilaterally BID  ENDOCRINE A:  DM2 P:   -SSI q4  NEUROLOGIC A:  Acute encephalopathy due to hypoxemia, heart failure, hypercapnea, improved P:   RASS goal: 0    FAMILY  - Updates: Son [name] updated by [facility] on phone on 4/10.  Left message on 4/13.      Otho Bellows, MD Internal Medicine Resident, [facility] 04/17/2014 7:24 AM   Attending:  I have seen and examined the patient with nurse practitioner/resident and agree with the note above.   Mr. Febus has improved today CXR images reviewed> improved airspace disease  On my exam he is awake and alert, lungs with crackles in bases, CV exam difficult due to obesity  Acute diastolic CHF exacerbation> improved, continue volume removal with HD AKI> non-oliguric so hopefully will see some renal recovery, continue HD for now, plan to stop within 24 hours Shock> presumably due to profound metabolic acidosis now resolved, uncertain etiology but favor acidosis, will d/c hydrocortisone Hypertension> resume home metoprolol Acute hypoxemic respiratory failure > improving wean O2 Deconditioning> consult PT when off CVVHD HCAP? > doubt, will stop antibiotics  He mentioned today that  he had a gas leak in his home, will ask his son to have the home checked for carbon monoxide.  Carboxyhemoglobin was slightly elevated on admission but only slightly, I do not think that this was the cause of admission.  Maintain in ICU given CVVHD  Roselie Awkward, MD [facility] Pager: [phone] Cell: [phone] If no response, call [phone]

## 2014-04-17 NOTE — Progress Notes (Signed)
CRITICAL VALUE ALERT  Critical value received:  PTT >200  Date of notification:  04/17/14  Time of notification:  0700  Critical value read back:yes  Nurse who received alert:  Duanne Moron RN   AM nurse notified and and CCM Resident on the floor aware. AM nurse will pass lab value on to CCM rounding this am. Heparin turned off.

## 2014-04-18 LAB — RENAL FUNCTION PANEL
Albumin: 2.6 g/dL — ABNORMAL LOW (ref 3.5–5.2)
Anion gap: 6 (ref 5–15)
BUN: 30 mg/dL — ABNORMAL HIGH (ref 6–23)
CALCIUM: 8.4 mg/dL (ref 8.4–10.5)
CO2: 30 mmol/L (ref 19–32)
CREATININE: 1.64 mg/dL — AB (ref 0.50–1.35)
Chloride: 102 mmol/L (ref 96–112)
GFR calc Af Amer: 50 mL/min — ABNORMAL LOW (ref >90)
GFR calc non Af Amer: 43 mL/min — ABNORMAL LOW (ref >90)
GLUCOSE: 105 mg/dL — AB (ref 70–99)
PHOSPHORUS: 3.6 mg/dL (ref 2.3–4.6)
Potassium: 4 mmol/L (ref 3.5–5.1)
SODIUM: 138 mmol/L (ref 135–145)

## 2014-04-18 LAB — GLUCOSE, CAPILLARY
GLUCOSE-CAPILLARY: 109 mg/dL — AB (ref 70–99)
Glucose-Capillary: 99 mg/dL (ref 70–99)

## 2014-04-18 LAB — CBC
HEMATOCRIT: 31 % — AB (ref 39.0–52.0)
HEMOGLOBIN: 8.4 g/dL — AB (ref 13.0–17.0)
MCH: 24.6 pg — AB (ref 26.0–34.0)
MCHC: 27.1 g/dL — AB (ref 30.0–36.0)
MCV: 90.6 fL (ref 78.0–100.0)
Platelets: 169 10*3/uL (ref 150–400)
RBC: 3.42 MIL/uL — ABNORMAL LOW (ref 4.22–5.81)
RDW: 22.9 % — AB (ref 11.5–15.5)
WBC: 4.8 10*3/uL (ref 4.0–10.5)

## 2014-04-18 LAB — HEPARIN LEVEL (UNFRACTIONATED): HEPARIN UNFRACTIONATED: 0.19 [IU]/mL — AB (ref 0.30–0.70)

## 2014-04-18 LAB — PROTIME-INR
INR: 1.26 (ref 0.00–1.49)
PROTHROMBIN TIME: 15.9 s — AB (ref 11.6–15.2)

## 2014-04-18 LAB — MAGNESIUM: MAGNESIUM: 2.6 mg/dL — AB (ref 1.5–2.5)

## 2014-04-18 MED ORDER — WARFARIN SODIUM 5 MG PO TABS
5.0000 mg | ORAL_TABLET | Freq: Once | ORAL | Status: AC
  Administered 2014-04-18: 5 mg via ORAL
  Filled 2014-04-18: qty 1

## 2014-04-18 MED ORDER — HEPARIN (PORCINE) IN NACL 100-0.45 UNIT/ML-% IJ SOLN
1900.0000 [IU]/h | INTRAMUSCULAR | Status: AC
  Administered 2014-04-18: 1650 [IU]/h via INTRAVENOUS
  Administered 2014-04-19 – 2014-04-22 (×5): 1900 [IU]/h via INTRAVENOUS
  Filled 2014-04-18 (×17): qty 250

## 2014-04-18 MED ORDER — STARCH (THICKENING) PO POWD: ORAL | Status: DC | PRN

## 2014-04-18 MED ORDER — RESOURCE THICKENUP CLEAR PO POWD
ORAL | Status: DC | PRN
  Filled 2014-04-18 (×2): qty 125

## 2014-04-18 MED ORDER — FAMOTIDINE IN NACL 20-0.9 MG/50ML-% IV SOLN
20.0000 mg | Freq: Two times a day (BID) | INTRAVENOUS | Status: DC
  Administered 2014-04-18 – 2014-04-19 (×2): 20 mg via INTRAVENOUS
  Filled 2014-04-18 (×3): qty 50

## 2014-04-18 MED ORDER — FUROSEMIDE 10 MG/ML IJ SOLN
100.0000 mg | Freq: Two times a day (BID) | INTRAVENOUS | Status: AC
  Administered 2014-04-18 (×2): 100 mg via INTRAVENOUS
  Filled 2014-04-18 (×2): qty 10

## 2014-04-18 NOTE — Progress Notes (Signed)
OT Cancellation Note  Patient Details Name: William Flores MRN: [id number] DOB: 1950-08-28   Cancelled Treatment:    Reason Eval/Treat Not Completed: Fatigue/lethargy limiting ability to participate (Pt reports just returning to bed. Will continue to follow.)  [name] 04/18/2014, 3:38 PM

## 2014-04-18 NOTE — Progress Notes (Signed)
Rehab Admissions Coordinator Note:  Patient was screened by Cleatrice Burke for appropriateness for an Inpatient Acute Rehab Consult per PT recommendation.   At this time, we are recommending Inpatient Rehab consult. Please place order so we can evaluate pt for possible admission. [employer].  Cleatrice Burke 04/18/2014, 11:59 AM  I can be reached at [phone].

## 2014-04-18 NOTE — Progress Notes (Signed)
ANTICOAGULATION CONSULT NOTE - Follow Up Consult  Pharmacy Consult for Heparin Indication: atrial fibrillation  Allergies  Allergen Reactions  . Codeine     Headache     Patient Measurements: Height: 5' 7"  (170.2 cm) Weight: 286 lb 6 oz (129.9 kg) IBW/kg (Calculated) : 66.1  Heparin dosing wt: 96 kg  Vital Signs: Temp: 98.9 F (37.2 C) (04/15 1600) Temp Source: Oral (04/15 1600) BP: 131/65 mmHg (04/15 1400) Pulse Rate: 94 (04/15 1700)  Labs:  Recent Labs  04/16/14 0400  04/17/14 0430 04/17/14 1545 04/17/14 2137 04/18/14 0430 04/18/14 1705  HGB  --   --  8.2*  --   --  8.4*  --   HCT  --   --  30.4*  --   --  31.0*  --   PLT  --   --  192  --   --  169  --   APTT 44*  --  >200*  --   --   --   --   LABPROT 15.4*  --  15.8*  --   --  15.9*  --   INR 1.20  --  1.25  --   --  1.26  --   HEPARINUNFRC  --   < > 0.39  --  0.22*  --  0.19*  CREATININE 2.01*  < > 1.43*  1.40* 1.42*  --  1.64*  --   < > = values in this interval not displayed.  Estimated Creatinine Clearance: 60.5 mL/min (by C-G formula based on Cr of 1.64).  Assessment: 35 yom continues on heparin bridge to warfarin for afib. Heparin level 0.19 (subtherapeutic) on 1650 units/hr. No bleeding noted and no issues with line per RN.  Goal of Therapy:  Heparin level 0.3-0.7 units/ml Monitor platelets by anticoagulation protocol: Yes   Plan:  Increase heparin to 1900 units/hr  F/u 6h HL  Sherlon Handing, PharmD, BCPS Clinical pharmacist, pager [phone] 04/18/2014 5:59 PM

## 2014-04-18 NOTE — Progress Notes (Signed)
NUTRITION FOLLOW-UP  DOCUMENTATION CODES Per approved criteria  -Morbid Obesity   INTERVENTION:  Magic cup at Breakfast   NUTRITION DIAGNOSIS: Inadequate oral intake related to dysphagia as evidenced by diet initiation; ongoing.   Goal: Pt to meet >/= 90% of their estimated nutrition needs; progressing    Monitor:  weight trends, labs, PO intake, I&O's  ASSESSMENT: Pt with hx of CHF, DM, OSA admitted with increased dyspnea and hypotension.   Pt intubated and started CRRT 4/11 for AKI. Pt now off CRRT and extubated 4/13.   Pt evaluated by SLP and PT. Started on Dysphagia 2 with Nectar thickened liquids. PT recommends CIR eval.  Pt seen during rounds. Per nursing student pt able to feed himself and consumed 75% of his needs today for Breakfast. Pt eating well and tolerating diet.  Pt on lasix and weight decreasing.   Height: Ht Readings from Last 1 Encounters:  04/14/14 5' 7"  (1.702 m)    Weight: Wt Readings from Last 1 Encounters:  04/18/14 286 lb 6 oz (129.9 kg)  Usual weight: 261 lb (118.7 kg) 02/2014   BMI:  Body mass index is 44.84 kg/(m^2).  Estimated Nutritional Needs: Kcal: 1800-2000 Protein: 115-135 grams Fluid: > 1.8 L/day  Skin: ecchymosis abd, excoriated groin  Diet Order: DIET DYS 3 Room service appropriate?: Yes; Fluid consistency:: Nectar Thick   Intake/Output Summary (Last 24 hours) at 04/18/14 1317 Last data filed at 04/18/14 1200  Gross per 24 hour  Intake 1500.08 ml  Output   1612 ml  Net -111.92 ml    Last BM: 4/11, watery stools   Labs:   Recent Labs Lab 04/16/14 0400  04/17/14 0430 04/17/14 1545 04/18/14 0430  NA 135  < > 137  137 136 138  K 3.9  < > 4.0  4.0 3.9 4.0  CL 94*  < > 99  99 103 102  CO2 31  < > 30  30 28 30   BUN 34*  < > 27*  27* 25* 30*  CREATININE 2.01*  < > 1.43*  1.40* 1.42* 1.64*  CALCIUM 7.9*  < > 8.3*  8.2* 7.9* 8.4  MG 2.3  --  2.4  --  2.6*  PHOS 4.8*  < > 3.8 3.4 3.6  GLUCOSE 180*  < > 119*   119* 196* 105*  < > = values in this interval not displayed.  CBG (last 3)   Recent Labs  04/17/14 1534 04/17/14 2008 04/18/14 0750  GLUCAP 190* 126* [place], LDN, [place] [phone] Pager [phone] After Hours Pager

## 2014-04-18 NOTE — Progress Notes (Signed)
ANTICOAGULATION CONSULT NOTE - Follow Up Consult  Pharmacy Consult for Heparin  Indication: atrial fibrillation  Allergies  Allergen Reactions  . Codeine     Headache     Patient Measurements: Height: 5' 7"  (170.2 cm) Weight: 287 lb 11.2 oz (130.5 kg) IBW/kg (Calculated) : 66.1  Vital Signs: Temp: 98.1 F (36.7 C) (04/14 1700) BP: 112/65 mmHg (04/15 0100) Pulse Rate: 88 (04/15 0100)  Labs:  Recent Labs  04/15/14 0141 04/15/14 0445  04/16/14 0400  04/16/14 1509 04/16/14 2030 04/17/14 0430 04/17/14 1545 04/17/14 2137  HGB 11.9* 8.8*  --   --   --   --   --  8.2*  --   --   HCT 35.0* 30.8*  --   --   --   --   --  30.4*  --   --   PLT  --  244  --   --   --   --   --  192  --   --   APTT  --  53*  --  44*  --   --   --  >200*  --   --   LABPROT  --  26.3*  --  15.4*  --   --   --  15.8*  --   --   INR  --  2.39*  --  1.20  --   --   --  1.25  --   --   HEPARINUNFRC  --   --   --   --   < >  --  0.47 0.39  --  0.22*  CREATININE 3.10* 2.98*  < > 2.01*  --  1.69*  --  1.43*  1.40* 1.42*  --   < > = values in this interval not displayed.  Estimated Creatinine Clearance: 70.1 mL/min (by C-G formula based on Cr of 1.42).  Assessment: Sub-therapeutic heparin level after re-start s/p holding for central line oozing. No problems currently per RN.  Goal of Therapy:  Heparin level 0.3-0.7 units/ml Monitor platelets by anticoagulation protocol: Yes   Plan:  -Increase heparin drip to 1650 units/hr -0800 HL -Daily CBC/HL -Monitor for bleeding  Narda Bonds 04/18/2014,1:32 AM

## 2014-04-18 NOTE — Progress Notes (Signed)
Patient ID: William Flores, male   DOB: [birth year], 64 y.o.   MRN: [id number]   SUBJECTIVE: Extubated and off all pressors.  Off CVVH --line clotted. .  Weight down another pound.   Yesterday he was started on metoprolol. Complains of fatigue.    . sodium chloride   Intravenous Once  . antiseptic oral rinse  7 mL Mouth Rinse BID  . clotrimazole   Topical BID  . famotidine (PEPCID) IV  20 mg Intravenous Q24H  . insulin aspart  0-5 Units Subcutaneous QHS  . insulin aspart  0-9 Units Subcutaneous TID WC  . metoprolol tartrate  25 mg Oral Q8H  . sodium chloride  10-40 mL Intracatheter Q12H  . Warfarin - Pharmacist Dosing Inpatient   Does not apply q1800   Continuous Infusions: . sodium chloride Stopped (04/15/14 0700)  . heparin 1,650 Units/hr (04/18/14 0147)  . norepinephrine (LEVOPHED) Adult infusion Stopped (04/16/14 0726)  . dialysis replacement fluid (prismasate) Stopped (04/17/14 1705)  . dialysis replacement fluid (prismasate) Stopped (04/17/14 1705)  . dialysate (PRISMASATE) Stopped (04/17/14 1705)   PRN Meds:.sodium chloride, fentaNYL (SUBLIMAZE) injection, heparin, heparin, sodium chloride, sodium chloride    Filed Vitals:   04/18/14 0300 04/18/14 0400 04/18/14 0415 04/18/14 0500  BP: 110/71 109/49  129/72  Pulse: 85 88  100  Temp: 98.1 F (36.7 C) 98.1 F (36.7 C)  97.7 F (36.5 C)  TempSrc:  Core (Comment)    Resp: 16 16  0  Height:      Weight:   286 lb 6 oz (129.9 kg)   SpO2: 99% 98%  100%    Intake/Output Summary (Last 24 hours) at 04/18/14 0709 Last data filed at 04/18/14 0500  Gross per 24 hour  Intake 900.08 ml  Output   1749 ml  Net -848.92 ml    LABS: Basic Metabolic Panel:  Recent Labs  04/17/14 0430 04/17/14 1545 04/18/14 0430  NA 137  137 136 138  K 4.0  4.0 3.9 4.0  CL 99  99 103 102  CO2 30  30 28 30   GLUCOSE 119*  119* 196* 105*  BUN 27*  27* 25* 30*  CREATININE 1.43*  1.40* 1.42* 1.64*  CALCIUM 8.3*  8.2* 7.9* 8.4  MG  2.4  --  2.6*  PHOS 3.8 3.4 3.6   Liver Function Tests:  Recent Labs  04/17/14 1545 04/18/14 0430  ALBUMIN 2.7* 2.6*   No results for input(s): LIPASE, AMYLASE in the last 72 hours. CBC:  Recent Labs  04/17/14 0430 04/18/14 0430  WBC 4.5 4.8  HGB 8.2* 8.4*  HCT 30.4* 31.0*  MCV 92.7 90.6  PLT 192 169   Cardiac Enzymes: No results for input(s): CKTOTAL, CKMB, CKMBINDEX, TROPONINI in the last 72 hours. BNP: Invalid input(s): POCBNP D-Dimer: No results for input(s): DDIMER in the last 72 hours. Hemoglobin A1C: No results for input(s): HGBA1C in the last 72 hours. Fasting Lipid Panel: No results for input(s): CHOL, HDL, LDLCALC, TRIG, CHOLHDL, LDLDIRECT in the last 72 hours. Thyroid Function Tests: No results for input(s): TSH, T4TOTAL, T3FREE, THYROIDAB in the last 72 hours.  Invalid input(s): FREET3 Anemia Panel: No results for input(s): VITAMINB12, FOLATE, FERRITIN, TIBC, IRON, RETICCTPCT in the last 72 hours.  RADIOLOGY: Dg Chest Port 1 View  04/17/2014   CLINICAL DATA:  Acute respiratory failure. Hypoxia. Follow-up exam.  EXAM: PORTABLE CHEST - 1 VIEW  COMPARISON:  04/16/2014.  FINDINGS: Right greater than left lung interstitial airspace opacities are mildly  improved from the previous day's study. No new lung opacities.  Right internal jugular dual-lumen central venous line and left internal jugular single-lumen central venous line are stable.  Endotracheal tube and nasogastric tube have been removed.  No pneumothorax.  IMPRESSION: 1. Mild improvement interstitial airspace opacities when compared to the previous day's study. 2. Status post removal of the endotracheal tube and nasogastric tube. 3. No other changes.   Electronically Signed   By: Lajean Manes M.D.   On: 04/17/2014 07:48   Dg Chest Port 1 View  04/16/2014   CLINICAL DATA:  Respiratory failure.  EXAM: PORTABLE CHEST - 1 VIEW  COMPARISON:  04/15/2014  FINDINGS: Endotracheal tube tip is approximately 3.5 cm  above the carina. Right jugular large-bore central venous catheter is stable in positioning in the upper SVC. Left jugular catheter tip is also in the SVC. Lungs show some improvement in aeration on the right with residual interstitial edema remaining as well as lower lung atelectasis. The heart size is stable. Less prominent appearance of right pleural fluid with probable component of bilateral pleural fluid remaining.  IMPRESSION: Improved aeration of the right lung. Residual interstitial edema with less prominent appearance of right pleural effusion.   Electronically Signed   By: Aletta Edouard M.D.   On: 04/16/2014 08:12   Dg Chest Port 1 View  04/15/2014   CLINICAL DATA:  Acute respiratory failure with hypoxia.  EXAM: PORTABLE CHEST - 1 VIEW  COMPARISON:  April 14, 2014.  FINDINGS: Stable cardiomegaly. No pneumothorax is noted. Increased bilateral pleural effusions are noted with right greater than left. Central pulmonary vascular congestion and perihilar edema is noted. Nasogastric tube is seen passing port stomach. Endotracheal tube is in grossly good position with distal tip 4 cm above the carina. Right internal jugular catheter is noted with distal tip overlying expected position of right innominate vein. Left internal jugular catheter is noted with distal tip in the expected position of the SVC.  IMPRESSION: Stable support apparatus. Continued presence of central pulmonary vascular congestion and bilateral pulmonary edema. Significantly increased bilateral pleural effusions are noted with right greater than left.   Electronically Signed   By: Marijo Conception, M.D.   On: 04/15/2014 07:35   Dg Chest Port 1 View  04/14/2014   CLINICAL DATA:  Central line placement.  EXAM: PORTABLE CHEST - 1 VIEW  COMPARISON:  Chest x-ray from the same day at 00:38  FINDINGS: New right IJ central line, tip near the right IJ/ brachiocephalic confluence. A left IJ central line is in similar position, tip at the upper SVC.  The endotracheal tube and orogastric tube remains in good position.  Unchanged cardiomegaly and upper mediastinal widening. There is diffuse interstitial and airspace opacity consistent with pulmonary edema. Layering pleural effusions are likely. No pneumothorax.  IMPRESSION: 1. New right neck central line, tip near the right IJ/ brachiocephalic vein confluence. No pneumothorax. 2. Pre-existing hardware is in unchanged position. 3. Unchanged pulmonary edema.   Electronically Signed   By: Monte Fantasia M.D.   On: 04/14/2014 05:32   Dg Chest Portable 1 View  04/14/2014   CLINICAL DATA:  Endotracheal tube placement.  EXAM: PORTABLE CHEST - 1 VIEW  COMPARISON:  Earlier this day at 0008 hr  FINDINGS: Endotracheal tube 3.3 cm from the carina. Enteric tube in place, mid and distal portions not well seen, tip obscure due to multiple overlying artifacts. Tip of the left central line in the SVC. Cardiomegaly is grossly stable.  Bilateral perihilar opacities, minimally improved in the right lung. Bilateral pleural effusions are unchanged.  IMPRESSION: 1. Endotracheal tube 3.3 cm from the carina. 2. Enteric tube in place, however not well visualized beyond the mid distal esophagus due to overlying monitoring devices.   Electronically Signed   By: Jeb Levering M.D.   On: 04/14/2014 01:56   Dg Chest Portable 1 View  04/14/2014   CLINICAL DATA:  Shortness of breath.  Central line placement  EXAM: PORTABLE CHEST - 1 VIEW  COMPARISON:  04/13/2014  FINDINGS: New left IJ catheter, tip at the upper SVC. No indication of pneumothorax.  Progressive bilateral lung opacification. There are layering pleural effusions. Stable cardiopericardial enlargement and vascular pedicle widening.  IMPRESSION: 1. New left IJ catheter is in good position. No visible pneumothorax. 2. Progressive CHF.   Electronically Signed   By: Monte Fantasia M.D.   On: 04/14/2014 00:58   Dg Chest Portable 1 View  04/13/2014   CLINICAL DATA:  Shortness of  breath and bradycardia  EXAM: PORTABLE CHEST - 1 VIEW  COMPARISON:  02/20/2014  FINDINGS: Chronic cardiomegaly, with size accentuated by increase in lower mediastinal fat on recent CT. There is widening of the vascular pedicle which is also also stable.  Haziness of the lower chest compatible with pleural effusions and atelectasis. There is pulmonary venous congestion and probable basilar alveolar edema.  IMPRESSION: CHF pattern.   Electronically Signed   By: Monte Fantasia M.D.   On: 04/13/2014 22:15   Dg Abd Portable 1v  04/14/2014   CLINICAL DATA:  Metabolic acidosis.  EXAM: PORTABLE ABDOMEN - 1 VIEW  COMPARISON:  02/19/2014  FINDINGS: There is a paucity of gas throughout the abdomen. No convincing evidence for bowel obstruction. NG tube is in the stomach. No free air on this portable supine study. No visible organomegaly or suspicious calcification.  IMPRESSION: Nonspecific bowel gas pattern with relative paucity of gas. No evidence of obstruction.   Electronically Signed   By: Rolm Baptise M.D.   On: 04/14/2014 09:19    PHYSICAL EXAM- CVP 17 General: NAD Neck: Thick, JVP to jaw cm, no thyromegaly or thyroid nodule.  Lungs: Clear to auscultation bilaterally with normal respiratory effort. CV: Nondisplaced PMI.  Heart regular S1/S2, no S3/S4, no murmur. Abdomen: Soft, nontender, no hepatosplenomegaly, no distention.  Neurologic: Alert/oriented Psych: Normal affect. Extremities: No clubbing or cyanosis. R and LLE SCDs. 2+ edemea  TELEMETRY: Reviewed telemetry pt in atrial fibrillation, 80s-90s.  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  1. Hypotension: Echo showed EF 65% with moderately dilated and mildly dysfunctional RV, co-ox has been normal. Difficult situation to figure out exactly what happened. I suspect that his  RV may be abnormal due to history of OHS/OSA, doubt PE with ongoing warfarin and INR > 5 at admission. I do not think this is cardiogenic shock based on echo and co-ox (epinephrine/milrinone may have been making his EF look better but I do not think he would have particularly bad LV function at baseline). His procalcitonin and WBC count have not been particularly high, so no definite sign of infection.  He is now off antibiotics. I wonder if the initial insult was not some form of renal insult with AKI and metabolic derangement, with profound acidosis and hyperkalemia accounting for initial presentation with bradycardia, hypotension, and obtundation. However, I am unsure what  initial renal insult would have been. There is apparently a history of diarrhea for a number of days. Hypotension resolved. 2. AKI: Renal following.  He is now off CVVH and making more urine. CVP 12.  3. Acute hypoxemic respiratory failure: Pulmonary edema in setting of AKI. Now extubated, denies dyspnea.   4. Chronic atrial fibrillation: Back on anticoagulation (heparin bridging to coumadin). Rate controlled on metoprolol.   CLEGG,AMY NP-C  04/18/2014 7:09 AM   Patient seen with NP, agree with the above note.  He is now off CVVH and making more urine. Rate controlled on metoprolol. No changes today from cardiac standpoint.   Loralie Champagne 04/18/2014 7:57 AM

## 2014-04-18 NOTE — Progress Notes (Signed)
Right radial Arterial line removed. Manual pressure held for 15 minutes. Pressure dressing appled. Site soft-  No hematoma.

## 2014-04-18 NOTE — Progress Notes (Signed)
Speech Language Pathology Treatment: Dysphagia  Patient Details Name: William Flores MRN: [id number] DOB: 12-20-1950 Today's Date: 04/18/2014 Time: 0258-5277 SLP Time Calculation (min) (ACUTE ONLY): 12 min  Assessment / Plan / Recommendation Clinical Impression  Pt continues to demonstrate mild throat clearing with trials of thin liquids. Tolerating nectar thick liquids with cues for slow rate. Pt able to masticate solids well. Will upgrade to Dys 3 (mechanical soft) diet with ongoing nectar thickened liquids. SLP will f/u for upgrade.    HPI HPI: 64 y/o male came to the [facility] ED on 4/10 by EMS for "low oxygen and trouble breathing". He was found to be in cardiogenic shock, bradycardic, in acute renal failure, hyperkalemic, and with lactic acidosis. Pt was intubated 4/10-4/13.   Pertinent Vitals Pain Assessment: No/denies pain  SLP Plan  Goals updated    Recommendations Diet recommendations: Dysphagia 3 (mechanical soft);Nectar-thick liquid Liquids provided via: Cup;Straw Medication Administration: Whole meds with puree Supervision: Staff to assist with self feeding;Full supervision/cueing for compensatory strategies Compensations: Slow rate;Small sips/bites;Follow solids with liquid Postural Changes and/or Swallow Maneuvers: Seated upright 90 degrees;Upright 30-60 min after meal              Oral Care Recommendations: Oral care BID Plan: Goals updated    GO    William Baltimore, MA CCC-SLP [phone]  Lynann Beaver 04/18/2014, 11:04 AM

## 2014-04-18 NOTE — Evaluation (Signed)
Physical Therapy Evaluation Patient Details Name: William Flores MRN: [id number] DOB: 19-Dec-1950 Today's Date: 04/18/2014   History of Present Illness  64 y/o male came to the [facility] ED on 4/10 by EMS for "low oxygen and trouble breathing". He was found to be in cardiogenic shock, bradycardic, in acute renal failure, hyperkalemic, and with lactic acidosis. Recent admission2/10 with respiratory failure. PMH of Afib, sleep apnea, heart failure. Intubated  4/10-4/13 with CVVHD stopped 4/14  Clinical Impression  Pt very pleasant male with difficulty with health problems since retirement. Pt eager to move around and return to independent function. Pt moving remarkably well given recent medical history prior and this admission. Hopeful for quick physical recovery pending cardiopulmonary status and tolerance. Pt with decreased activity tolerance, function and mobility who will benefit from acute therapy to maximize mobility, balance, function and gait to decrease burden of care.     Follow Up Recommendations CIR    Equipment Recommendations  None recommended by PT    Recommendations for Other Services OT consult;Rehab consult     Precautions / Restrictions Precautions Precautions: Fall Restrictions Weight Bearing Restrictions: No      Mobility  Bed Mobility Overal bed mobility: Needs Assistance Bed Mobility: Supine to Sit     Supine to sit: Min assist     General bed mobility comments: use of rail and min assist to fully elevate trunk from surface with cues for sequence  Transfers Overall transfer level: Needs assistance   Transfers: Sit to/from Stand Sit to Stand: Min assist         General transfer comment: cues for hand placement and safety with sequence  Ambulation/Gait Ambulation/Gait assistance: Min assist;+2 safety/equipment Ambulation Distance (Feet): 25 Feet Assistive device: Rolling walker (2 wheeled) Gait Pattern/deviations: Step-through pattern;Decreased stride  length;Trunk flexed   Gait velocity interpretation: Below normal speed for age/gender General Gait Details: cues for posture, position in RW and safety with chair followed due to fatigue  Stairs            Wheelchair Mobility    Modified Rankin (Stroke Patients Only)       Balance Overall balance assessment: Needs assistance   Sitting balance-Leahy Scale: Good       Standing balance-Leahy Scale: Fair                               Pertinent Vitals/Pain Pain Assessment: No/denies pain  96% on 2L HR 102-124 BP 122/58    Home Living Family/patient expects to be discharged to:: Private residence Living Arrangements: Alone Available Help at Discharge: Family;Friend(s);Available PRN/intermittently Type of Home: House Home Access: Stairs to enter Entrance Stairs-Rails: Chemical engineer of Steps: 3 Home Layout: One level Home Equipment: Walker - 2 wheels;Cane - single point;Bedside commode      Prior Function Level of Independence: Independent         Comments: just retired in Jan admitted to the hospital in Feb with short SNF stay prior to home and then this readmission.      Hand Dominance        Extremity/Trunk Assessment   Upper Extremity Assessment: Overall WFL for tasks assessed           Lower Extremity Assessment: Overall WFL for tasks assessed      Cervical / Trunk Assessment: Normal  Communication   Communication: No difficulties  Cognition Arousal/Alertness: Awake/alert Behavior During Therapy: WFL for tasks assessed/performed Overall Cognitive Status:  Impaired/Different from baseline Area of Impairment: Orientation Orientation Level: Situation                  General Comments      Exercises        Assessment/Plan    PT Assessment Patient needs continued PT services  PT Diagnosis Difficulty walking   PT Problem List Decreased activity tolerance;Decreased balance;Decreased  mobility;Cardiopulmonary status limiting activity;Decreased knowledge of use of DME  PT Treatment Interventions Gait training;Stair training;Functional mobility training;Therapeutic activities;Therapeutic exercise;Patient/family education;Balance training;DME instruction   PT Goals (Current goals can be found in the Care Plan section) Acute Rehab PT Goals Patient Stated Goal: go hunting and fishing PT Goal Formulation: With patient Time For Goal Achievement: 05/02/14 Potential to Achieve Goals: Good    Frequency Min 3X/week   Barriers to discharge Decreased caregiver support      Co-evaluation               End of Session Equipment Utilized During Treatment: Gait belt;Oxygen Activity Tolerance: Patient tolerated treatment well Patient left: in chair;with call bell/phone within reach Nurse Communication: Mobility status         Time: 1552-0802 PT Time Calculation (min) (ACUTE ONLY): 21 min   Charges:   PT Evaluation $Initial PT Evaluation Tier I: 1 Procedure     PT G CodesMelford Aase 04/18/2014, 10:21 AM Elwyn Reach, [place]

## 2014-04-18 NOTE — Progress Notes (Signed)
Assessment: 1. Acute kidney injury - d/t shock, recovery phase 2. Volume xs/CHF   Plan - will monitor UOP, hopefully will not require further dialysis; will give lasix to increase UOP volume. DC Foley  Subjective: Interval History: off CVVHD, making urine  Objective: Vital signs in last 24 hours: Temp:  [96.8 F (36 C)-98.4 F (36.9 C)] 97.7 F (36.5 C) (04/15 0800) Pulse Rate:  [61-115] 87 (04/15 0800) Resp:  [0-23] 18 (04/15 0800) BP: (102-135)/(49-75) 124/69 mmHg (04/15 0800) SpO2:  [88 %-100 %] 99 % (04/15 0800) Weight:  [129.9 kg (286 lb 6 oz)] 129.9 kg (286 lb 6 oz) (04/15 0415) Weight change: -0.6 kg (-1 lb 5.2 oz)  Intake/Output from previous day: 04/14 0701 - 04/15 0700 In: 933.1 [P.O.:562; I.V.:235.1; IV Piggyback:100] Out: 1999 [Urine:850] Intake/Output this shift: Total I/O In: 360 [P.O.:360] Out: -   General appearance: alert and cooperative Resp: clear to auscultation bilaterally Cardio: regular rate and rhythm, S1, S2 normal, no murmur, click, rub or gallop Extremities: edema tr withchronic changes  Lab Results:  Recent Labs  04/17/14 0430 04/18/14 0430  WBC 4.5 4.8  HGB 8.2* 8.4*  HCT 30.4* 31.0*  PLT 192 169   BMET:  Recent Labs  04/17/14 1545 04/18/14 0430  NA 136 138  K 3.9 4.0  CL 103 102  CO2 28 30  GLUCOSE 196* 105*  BUN 25* 30*  CREATININE 1.42* 1.64*  CALCIUM 7.9* 8.4   No results for input(s): PTH in the last 72 hours. Iron Studies: No results for input(s): IRON, TIBC, TRANSFERRIN, FERRITIN in the last 72 hours. Studies/Results: Dg Chest Port 1 View  04/17/2014   CLINICAL DATA:  Acute respiratory failure. Hypoxia. Follow-up exam.  EXAM: PORTABLE CHEST - 1 VIEW  COMPARISON:  04/16/2014.  FINDINGS: Right greater than left lung interstitial airspace opacities are mildly improved from the previous day's study. No new lung opacities.  Right internal jugular dual-lumen central venous line and left internal jugular single-lumen  central venous line are stable.  Endotracheal tube and nasogastric tube have been removed.  No pneumothorax.  IMPRESSION: 1. Mild improvement interstitial airspace opacities when compared to the previous day's study. 2. Status post removal of the endotracheal tube and nasogastric tube. 3. No other changes.   Electronically Signed   By: Lajean Manes M.D.   On: 04/17/2014 07:48   Scheduled: . sodium chloride   Intravenous Once  . antiseptic oral rinse  7 mL Mouth Rinse BID  . clotrimazole   Topical BID  . famotidine (PEPCID) IV  20 mg Intravenous Q24H  . insulin aspart  0-5 Units Subcutaneous QHS  . insulin aspart  0-9 Units Subcutaneous TID WC  . metoprolol tartrate  25 mg Oral Q8H  . sodium chloride  10-40 mL Intracatheter Q12H  . Warfarin - Pharmacist Dosing Inpatient   Does not apply q1800     LOS: 4 days   Edilson Vital C 04/18/2014,9:43 AM

## 2014-04-18 NOTE — Progress Notes (Signed)
PULMONARY / CRITICAL CARE MEDICINE   Name: William Flores MRN: [id number] DOB: 1950/11/30    ADMISSION DATE:  04/13/2014 CONSULTATION DATE:  04/13/14  REFERRING MD :  Ashok Cordia  CHIEF COMPLAINT:  Shortness of breath  INITIAL PRESENTATION: 64 y/o male came to the [facility] ED on 4/10 by EMS for "low oxygen and trouble breathing".  He was found to be in cardiogenic shock, bradycardic, in acute renal failure, hyperkalemic, and with lactic acidosis.   STUDIES:  4/11 KUB: non-obstructive gas pattern 4/11 Echo: EF 65-70%, no WMA, RV and RA severely dilated, PA pressure 28mHg  SIGNIFICANT EVENTS: 4/11: cardiogenic shock, multiple pressors, intubated, 4/11 CVVHD 4/13: Off pressors  4/13: Extubated 4/14: off CVVHD   HISTORY OF PRESENT ILLNESS:  64[facility]low oxygen and trouble breathing".  He was found to be in cardiogenic shock, bradycardic, in acute renal failure, hyperkalemic, and in a severe metabolic acidosis. There was no family to provide history and the patient was encephalopathic so no further history could be obtained.   SUBJECTIVE: improving urine output, Off CVVHD   VITAL SIGNS: Temp:  [96.6 F (35.9 C)-98.4 F (36.9 C)] 98.4 F (36.9 C) (04/15 0100) Pulse Rate:  [36-115] 88 (04/15 0100) Resp:  [12-26] 19 (04/15 0100) BP: (106-135)/(53-75) 112/65 mmHg (04/15 0100) SpO2:  [71 %-100 %] 100 % (04/15 0100) Weight:  [129.9 kg (286 lb 6 oz)] 129.9 kg (286 lb 6 oz) (04/15 0415) HEMODYNAMICS: CVP:  [12 mmHg-13 mmHg] 12 mmHg VENTILATOR SETTINGS:   INTAKE / OUTPUT:  Intake/Output Summary (Last 24 hours) at 04/18/14 0517 Last data filed at 04/18/14 0100  Gross per 24 hour  Intake 865.25 ml  Output   1940 ml  Net -1074.75 ml    PHYSICAL EXAMINATION: Gen: sonorous, comfortable HENT: NCAT, OP clear PULM: CTA ant/lat today CV: RRR today, distant heart sounds GI: BS+, soft, nontender MSK: normal bulk and tone Neuro: Sonorous, arouses,  oriented, maew    LABS:  CBC  Recent Labs Lab 04/15/14 0445 04/17/14 0430 04/18/14 0430  WBC 6.8 4.5 4.8  HGB 8.8* 8.2* 8.4*  HCT 30.8* 30.4* 31.0*  PLT 244 192 169   Coag's  Recent Labs Lab 04/15/14 0445 04/16/14 0400 04/17/14 0430 04/18/14 0430  APTT 53* 44* >200*  --   INR 2.39* 1.20 1.25 1.26   BMET  Recent Labs Lab 04/17/14 0430 04/17/14 1545 04/18/14 0430  NA 137  137 136 138  K 4.0  4.0 3.9 4.0  CL 99  99 103 102  CO2 30  30 28 30   BUN 27*  27* 25* 30*  CREATININE 1.43*  1.40* 1.42* 1.64*  GLUCOSE 119*  119* 196* 105*   Electrolytes  Recent Labs Lab 04/16/14 0400  04/17/14 0430 04/17/14 1545 04/18/14 0430  CALCIUM 7.9*  < > 8.3*  8.2* 7.9* 8.4  MG 2.3  --  2.4  --  2.6*  PHOS 4.8*  < > 3.8 3.4 3.6  < > = values in this interval not displayed. Sepsis Markers  Recent Labs Lab 04/14/14 0213 04/14/14 0221 04/14/14 0710 04/14/14 1310 04/15/14 0445 04/16/14 0400  LATICACIDVEN 9.6*  --  11.6* 10.1*  --   --   PROCALCITON  --  0.11  --   --  0.63 0.57   ABG  Recent Labs Lab 04/15/14 1042 04/15/14 1100 04/15/14 1543  PHART 7.429 PENDING 7.335*  PCO2ART 51.0* PENDING 66.0*  PO2ART 143.0*  PENDING 90.0   Liver Enzymes  Recent Labs Lab 04/13/14 2140  04/17/14 0430 04/17/14 1545 04/18/14 0430  AST 26  --   --   --   --   ALT 20  --   --   --   --   ALKPHOS 69  --   --   --   --   BILITOT 0.6  --   --   --   --   ALBUMIN 3.1*  < > 2.6* 2.7* 2.6*  < > = values in this interval not displayed. Cardiac Enzymes  Recent Labs Lab 04/14/14 0221 04/14/14 0708 04/14/14 1308  TROPONINI <0.03 0.03 0.22*   Glucose  Recent Labs Lab 04/17/14 0015 04/17/14 0430 04/17/14 0749 04/17/14 1109 04/17/14 1534 04/17/14 2008  GLUCAP 125* 117* 92 109* 190* 126*    Imaging 4/14 CXR images reviewed> improving airspace disease bilaterally   ASSESSMENT / PLAN:  PULMONARY OETT 4/11 >>4/13 A: Acute respiratory failure due to  acute pulmonary edema vs HCAP continues to improve with volume removal History of stridor post extubation last hospital stay P:   - See ID - See cardiology - Continue supplemental O2 as needed  CARDIOVASCULAR CVL L IJ 4/11 >> A: Acute circulatory shock, improving, felt to be due to acidosis Chronic systolic heart failure > echo showed improved LVEF Chronic atrial fibriliation on warfarin, rate controlled  P:  - Appreciate heart failure team - Continue metoprolol, holding ACE with lisinopril - MAP goal > 65  RENAL A:  Acute kidney injury, uncertain etiology > improving, etiology uncertain  P:   - monitor UOP and need for HD - foley per renal - f/u renal rec  GASTROINTESTINAL A:   Diarrhea > resolved Mild dysphagia P:   - Dysphagia 2 diet - Pepcid for SUP  HEMATOLOGIC A:   Coagulopathy due to warfarin, INR >5 on admit, now resolved P:  - heparin gtt until warfarin therapeutic - Warfarin per pharm - Monitor for bleeding  INFECTIOUS A:  No clear source of infection BCx2 4/11 Negative  Candidial intertrigo   P:   - Abx: vanc, start date 4/11 > 4/13 - Abx Cefepime, start date 4/11 > stopped 4/14 - Topical clotrimazole for groin bilaterally BID  ENDOCRINE A:  DM2 P:   -SSI q4  NEUROLOGIC A:  Acute encephalopathy due to hypoxemia, heart failure, hypercapnea> resolved Deconitioning P:   PT/OT Out of bed   FAMILY  - Updates: Son [name] updated by [facility] on phone on 4/10.  Left message on 4/13.     Roselie Awkward, MD [facility] Pager: [phone] Cell: [phone] If no response, call [phone]

## 2014-04-18 NOTE — Progress Notes (Addendum)
ANTICOAGULATION CONSULT NOTE - Follow Up Consult  Pharmacy Consult for Heparin/warfarin Indication: atrial fibrillation  Allergies  Allergen Reactions  . Codeine     Headache     Patient Measurements: Height: 5' 7"  (170.2 cm) Weight: 286 lb 6 oz (129.9 kg) IBW/kg (Calculated) : 66.1  Vital Signs: Temp: 98.2 F (36.8 C) (04/15 1000) Temp Source: Core (Comment) (04/15 0400) BP: 122/58 mmHg (04/15 1016) Pulse Rate: 102 (04/15 1016)  Labs:  Recent Labs  04/16/14 0400  04/16/14 2030 04/17/14 0430 04/17/14 1545 04/17/14 2137 04/18/14 0430  HGB  --   --   --  8.2*  --   --  8.4*  HCT  --   --   --  30.4*  --   --  31.0*  PLT  --   --   --  192  --   --  169  APTT 44*  --   --  >200*  --   --   --   LABPROT 15.4*  --   --  15.8*  --   --  15.9*  INR 1.20  --   --  1.25  --   --  1.26  HEPARINUNFRC  --   < > 0.47 0.39  --  0.22*  --   CREATININE 2.01*  < >  --  1.43*  1.40* 1.42*  --  1.64*  < > = values in this interval not displayed.  Estimated Creatinine Clearance: 60.5 mL/min (by C-G formula based on Cr of 1.64).  Assessment: 66 yom continuing on heparin/warfarin for afib. INR supratherapeutic 6.73 on admit, now stable 1.25>>1.26 (s/p vit k/FFP on 4/13). Heparin IV was turned this AM for ~3h for Aline pull and restarted at 1130 per RN at previous rate. HL for 0800 was cancelled after rate was increased previously for subtherapeutic HL (0.22). To restart heparin at previous increased rate and check 6h HL. Warfarin held 4/13 due to inability to swallow. Restarted 4/14. CBC stable. Some oozing 4/14 from IV site but ok to restart anticoag per MD 4/14. No further bleeding today per nurse.  PTA warfarin dosing says 5-18m qd but med rec says patient was not taking med.  Goal of Therapy:  Heparin level 0.3-0.7 units/ml Monitor platelets by anticoagulation protocol: Yes   Plan:  Restart heparin at 1650 units/hr (stopped for Aline pull) 6h HL Warf [id number] tonight Daily  HL/CBC Daily INR/PT Monitor s/sx of bleeding  HaElicia LampPharmD Clinical Pharmacist - Resident Pager [phone]/15/2016 3:17 PM

## 2014-04-18 NOTE — Consult Note (Signed)
Physical Medicine and Rehabilitation Consult Reason for Consult: Debilitation related to cardiogenic shock acute renal failure multi medical Referring Physician: Critical care right-handed   HPI: William Flores is a 64 y.o. male with history of diabetes mellitus peripheral neuropathy, diastolic congestive heart failure, atrial fibrillation maintained on chronic Coumadin. Patient lives alone independently prior to admission. Presented 04/12/2013 with increasing shortness of breath. Noted recent admission in February for exacerbation of congestive heart failure and was discharged to skilled nursing facility 20 days before returning home. Patient has required intubation noted acute renal failure with creatinine 5.66/hypotension followed by critical care medicine. Chest x-ray consistent with CHF pattern. Renal services have been consulted for renal failure and did require CVVHD for a short time with latest creatinine 1.64. Currently on a mechanical soft nectar thick liquid diet. Patient has since been extubated. Physical therapy evaluation completed 04/18/2014 with recommendations of physical medicine rehabilitation consult.   Review of Systems  Respiratory: Positive for shortness of breath.   Cardiovascular: Positive for palpitations and leg swelling.  Gastrointestinal: Positive for constipation.  Neurological: Positive for weakness.  All other systems reviewed and are negative.  Past Medical History  Diagnosis Date  . Diabetes   . Colon cancer   . Hypertension   . Sleep apnea   . Atrial fibrillation   . Hyperlipidemia   . CHF (congestive heart failure)   . Colon polyps 09/21/2012    Tubular adenoma   Past Surgical History  Procedure Laterality Date  . Colon resection    . Circumcision    . Colonoscopy N/A 09/21/2012    Procedure: COLONOSCOPY;  Surgeon: Inda Castle, MD;  Location: [facility] END[facility];  Service: Endoscopy;  Laterality: N/A;  . US echocardiography  03/04/2010   abnormal study   Family History  Problem Relation Age of Onset  . Breast cancer Mother   . Diabetes Mother    Social History:  reports that he quit smoking about 30 years ago. He has never used smokeless tobacco. He reports that he does not drink alcohol or use illicit drugs. Allergies:  Allergies  Allergen Reactions  . Codeine     Headache    Medications Prior to Admission  Medication Sig Dispense Refill  . allopurinol (ZYLOPRIM) 100 MG tablet Take 100 mg by mouth daily.  3  . aspirin 81 MG tablet Take 81 mg by mouth daily.    . benzonatate (TESSALON) 100 MG capsule Take 1 capsule (100 mg total) by mouth 2 (two) times daily. 20 capsule 0  . digoxin (LANOXIN) 0.125 MG tablet Take 0.0625 mg by mouth daily.  0  . diltiazem (CARDIZEM CD) 360 MG 24 hr capsule Take 360 mg by mouth daily.  5  . ferrous sulfate 325 (65 FE) MG tablet Take 1 tablet (325 mg total) by mouth 2 (two) times daily with a meal.  3  . furosemide (LASIX) 40 MG tablet Take 40 mg by mouth daily.  3  . glucose blood test strip Use to check BG daily 100 each 2  . KLOR-CON M20 20 MEQ tablet Take 10 mEq by mouth daily.  11  . lisinopril (PRINIVIL,ZESTRIL) 5 MG tablet 1 tab daily 30 tablet 11  . metolazone (ZAROXOLYN) 2.5 MG tablet TAKE 1 TABLET (2.5 MG TOTAL) BY MOUTH DAILY. 30 tablet 2  . omeprazole (PRILOSEC) 20 MG capsule TAKE 1 CAPSULE (20 MG TOTAL) BY MOUTH DAILY. 30 capsule 4  . ONETOUCH DELICA LANCETS 68L MISC 1 each by  Does not apply route daily. Use one lancet to check BG daily 100 each 2  . warfarin (COUMADIN) 5 MG tablet Take 5-10 mg by mouth. Use as directed by anticoagulation clinic    . allopurinol (ZYLOPRIM) 300 MG tablet TAKE 1 TABLET (300 MG TOTAL) BY MOUTH DAILY. ALSO TAKE 100MG DAILY TO EQUAL 400MG TOTAL. (Patient not taking: Reported on 04/14/2014) 30 tablet 0  . atorvastatin (LIPITOR) 20 MG tablet Take 1 tablet (20 mg total) by mouth daily at 6 PM. 30 tablet 0  . azelastine (OPTIVAR) 0.05 % ophthalmic  solution Place 2 drops into both eyes 2 (two) times daily. 6 mL 12  . metoprolol succinate (TOPROL-XL) 50 MG 24 hr tablet Take 1 tablet (50 mg total) by mouth 2 (two) times daily. Take with or immediately following a meal. 60 tablet 0  . sitaGLIPtin-metformin (JANUMET) 50-1000 MG per tablet Take 1 tablet by mouth 2 (two) times daily with a meal. 60 tablet 3  . warfarin (COUMADIN) 10 MG tablet Take 1 tablet (10 mg total) by mouth daily at 6 PM. (Patient not taking: Reported on 04/14/2014) 30 tablet 0    Home: Home Living Family/patient expects to be discharged to:: Private residence Living Arrangements: Alone Available Help at Discharge: Family, Friend(s), Available PRN/intermittently Type of Home: House Home Access: Stairs to enter Technical brewer of Steps: 3 Entrance Stairs-Rails: Left, Right Home Layout: One level Home Equipment: Walker - 2 wheels, Cane - single point, Bedside commode  Functional History: Prior Function Level of Independence: Independent Comments: just retired in Jan admitted to the hospital in Feb with short SNF stay prior to home and then this readmission.  Functional Status:  Mobility: Bed Mobility Overal bed mobility: Needs Assistance Bed Mobility: Supine to Sit Supine to sit: Min assist General bed mobility comments: use of rail and min assist to fully elevate trunk from surface with cues for sequence Transfers Overall transfer level: Needs assistance Transfers: Sit to/from Stand Sit to Stand: Min assist General transfer comment: cues for hand placement and safety with sequence Ambulation/Gait Ambulation/Gait assistance: Min assist, +2 safety/equipment Ambulation Distance (Feet): 25 Feet Assistive device: Rolling walker (2 wheeled) Gait Pattern/deviations: Step-through pattern, Decreased stride length, Trunk flexed Gait velocity interpretation: Below normal speed for age/gender General Gait Details: cues for posture, position in RW and safety with  chair followed due to fatigue    ADL:    Cognition: Cognition Overall Cognitive Status: Impaired/Different from baseline Orientation Level: Oriented X4 Cognition Arousal/Alertness: Awake/alert Behavior During Therapy: WFL for tasks assessed/performed Overall Cognitive Status: Impaired/Different from baseline Area of Impairment: Orientation Orientation Level: Situation  Blood pressure 131/65, pulse 74, temperature 99 F (37.2 C), temperature source Core (Comment), resp. rate 27, height 5' 7"  (1.702 m), weight 129.9 kg (286 lb 6 oz), SpO2 96 %. Physical Exam  Constitutional: He is oriented to person, place, and time.  HENT:  Head: Normocephalic.  Eyes: EOM are normal.  Neck: Normal range of motion. Neck supple. No thyromegaly present.  Cardiovascular:  Cardiac rate controlled  Respiratory:  Decreased breath sounds at bases  Neurological: He is alert and oriented to person, place, and time.  Skin: Skin is warm and dry.    Results for orders placed or performed during the hospital encounter of 04/13/14 (from the past 24 hour(s))  Glucose, capillary     Status: Abnormal   Collection Time: 04/17/14  3:34 PM  Result Value Ref Range   Glucose-Capillary 190 (H) 70 - 99 mg/dL  Renal  function panel (daily at 1600)     Status: Abnormal   Collection Time: 04/17/14  3:45 PM  Result Value Ref Range   Sodium 136 135 - 145 mmol/L   Potassium 3.9 3.5 - 5.1 mmol/L   Chloride 103 96 - 112 mmol/L   CO2 28 19 - 32 mmol/L   Glucose, Bld 196 (H) 70 - 99 mg/dL   BUN 25 (H) 6 - 23 mg/dL   Creatinine, Ser 1.42 (H) 0.50 - 1.35 mg/dL   Calcium 7.9 (L) 8.4 - 10.5 mg/dL   Phosphorus 3.4 2.3 - 4.6 mg/dL   Albumin 2.7 (L) 3.5 - 5.2 g/dL   GFR calc non Af Amer 51 (L) >90 mL/min   GFR calc Af Amer 60 (L) >90 mL/min   Anion gap 5 5 - 15  Glucose, capillary     Status: Abnormal   Collection Time: 04/17/14  8:08 PM  Result Value Ref Range   Glucose-Capillary 126 (H) 70 - 99 mg/dL   Comment 1  Arterial Specimen   Heparin level (unfractionated)     Status: Abnormal   Collection Time: 04/17/14  9:37 PM  Result Value Ref Range   Heparin Unfractionated 0.22 (L) 0.30 - 0.70 IU/mL  Renal function panel     Status: Abnormal   Collection Time: 04/18/14  4:30 AM  Result Value Ref Range   Sodium 138 135 - 145 mmol/L   Potassium 4.0 3.5 - 5.1 mmol/L   Chloride 102 96 - 112 mmol/L   CO2 30 19 - 32 mmol/L   Glucose, Bld 105 (H) 70 - 99 mg/dL   BUN 30 (H) 6 - 23 mg/dL   Creatinine, Ser 1.64 (H) 0.50 - 1.35 mg/dL   Calcium 8.4 8.4 - 10.5 mg/dL   Phosphorus 3.6 2.3 - 4.6 mg/dL   Albumin 2.6 (L) 3.5 - 5.2 g/dL   GFR calc non Af Amer 43 (L) >90 mL/min   GFR calc Af Amer 50 (L) >90 mL/min   Anion gap 6 5 - 15  Protime-INR     Status: Abnormal   Collection Time: 04/18/14  4:30 AM  Result Value Ref Range   Prothrombin Time 15.9 (H) 11.6 - 15.2 seconds   INR 1.26 0.00 - 1.49  Magnesium     Status: Abnormal   Collection Time: 04/18/14  4:30 AM  Result Value Ref Range   Magnesium 2.6 (H) 1.5 - 2.5 mg/dL  CBC     Status: Abnormal   Collection Time: 04/18/14  4:30 AM  Result Value Ref Range   WBC 4.8 4.0 - 10.5 K/uL   RBC 3.42 (L) 4.22 - 5.81 MIL/uL   Hemoglobin 8.4 (L) 13.0 - 17.0 g/dL   HCT 31.0 (L) 39.0 - 52.0 %   MCV 90.6 78.0 - 100.0 fL   MCH 24.6 (L) 26.0 - 34.0 pg   MCHC 27.1 (L) 30.0 - 36.0 g/dL   RDW 22.9 (H) 11.5 - 15.5 %   Platelets 169 150 - 400 K/uL  Glucose, capillary     Status: None   Collection Time: 04/18/14  7:50 AM  Result Value Ref Range   Glucose-Capillary 99 70 - 99 mg/dL   Dg Chest Port 1 View  04/17/2014   CLINICAL DATA:  Acute respiratory failure. Hypoxia. Follow-up exam.  EXAM: PORTABLE CHEST - 1 VIEW  COMPARISON:  04/16/2014.  FINDINGS: Right greater than left lung interstitial airspace opacities are mildly improved from the previous day's study. No new lung opacities.  Right internal jugular dual-lumen central venous line and left internal jugular  single-lumen central venous line are stable.  Endotracheal tube and nasogastric tube have been removed.  No pneumothorax.  IMPRESSION: 1. Mild improvement interstitial airspace opacities when compared to the previous day's study. 2. Status post removal of the endotracheal tube and nasogastric tube. 3. No other changes.   Electronically Signed   By: Lajean Manes M.D.   On: 04/17/2014 07:48    Assessment/Plan: Diagnosis: debility after CHF/renal failure 1. Does the need for close, 24 hr/day medical supervision in concert with the patient's rehab needs make it unreasonable for this patient to be served in a less intensive setting? Potentially 2. Co-Morbidities requiring supervision/potential complications: morbid obesity, dm2, gout, anemia 3. Due to bladder management, bowel management, safety, skin/wound care, disease management, medication administration and pain management, does the patient require 24 hr/day rehab nursing? Yes 4. Does the patient require coordinated care of a physician, rehab nurse, PT (1-2 hrs/day, 5 days/week) and OT (1-2 hrs/day, 5 days/week) to address physical and functional deficits in the context of the above medical diagnosis(es)? Potentially Addressing deficits in the following areas: balance, endurance, locomotion, strength, transferring, bowel/bladder control, bathing, dressing and feeding 5. Can the patient actively participate in an intensive therapy program of at least 3 hrs of therapy per day at least 5 days per week? Yes 6. The potential for patient to make measurable gains while on inpatient rehab is fair 7. Anticipated functional outcomes upon discharge from inpatient rehab are modified independent  with PT, modified independent with OT, n/a with SLP. 8. Estimated rehab length of stay to reach the above functional goals is: potentially up to 7 days if needed 9. Does the patient have adequate social supports and living environment to accommodate these discharge  functional goals? Potentially 10. Anticipated D/C setting: Home 11. Anticipated post D/C treatments: Shambaugh therapy 12. Overall Rehab/Functional Prognosis: good  RECOMMENDATIONS: This patient's condition is appropriate for continued rehabilitative care in the following setting: Home health vs CIR Patient has agreed to participate in recommended program. Potentially Note that insurance prior authorization may be required for reimbursement for recommended care.  Comment: pt was already at a min assist level on Friday. Will follow for functional progress. Could consider rehab admission if he fails to progress further with therapy by Monday.   Meredith Staggers, MD, [facility] 04/19/2014     04/18/2014

## 2014-04-19 DIAGNOSIS — I482 Chronic atrial fibrillation: Secondary | ICD-10-CM

## 2014-04-19 LAB — GLUCOSE, CAPILLARY
GLUCOSE-CAPILLARY: 134 mg/dL — AB (ref 70–99)
GLUCOSE-CAPILLARY: 100 mg/dL — AB (ref 70–99)
GLUCOSE-CAPILLARY: 99 mg/dL (ref 70–99)
GLUCOSE-CAPILLARY: 178 mg/dL — AB (ref 70–99)
Glucose-Capillary: 121 mg/dL — ABNORMAL HIGH (ref 70–99)
Glucose-Capillary: 136 mg/dL — ABNORMAL HIGH (ref 70–99)

## 2014-04-19 LAB — CBC
HCT: 29.6 % — ABNORMAL LOW (ref 39.0–52.0)
Hemoglobin: 8.3 g/dL — ABNORMAL LOW (ref 13.0–17.0)
MCH: 24.8 pg — ABNORMAL LOW (ref 26.0–34.0)
MCHC: 28 g/dL — ABNORMAL LOW (ref 30.0–36.0)
MCV: 88.4 fL (ref 78.0–100.0)
PLATELETS: 140 10*3/uL — AB (ref 150–400)
RBC: 3.35 MIL/uL — ABNORMAL LOW (ref 4.22–5.81)
RDW: 22.4 % — ABNORMAL HIGH (ref 11.5–15.5)
WBC: 4.3 10*3/uL (ref 4.0–10.5)

## 2014-04-19 LAB — RENAL FUNCTION PANEL
Albumin: 2.7 g/dL — ABNORMAL LOW (ref 3.5–5.2)
Anion gap: 9 (ref 5–15)
BUN: 37 mg/dL — AB (ref 6–23)
CHLORIDE: 99 mmol/L (ref 96–112)
CO2: 31 mmol/L (ref 19–32)
CREATININE: 1.84 mg/dL — AB (ref 0.50–1.35)
Calcium: 8.4 mg/dL (ref 8.4–10.5)
GFR calc Af Amer: 44 mL/min — ABNORMAL LOW (ref >90)
GFR, EST NON AFRICAN AMERICAN: 38 mL/min — AB (ref >90)
Glucose, Bld: 112 mg/dL — ABNORMAL HIGH (ref 70–99)
PHOSPHORUS: 3.6 mg/dL (ref 2.3–4.6)
Potassium: 3.5 mmol/L (ref 3.5–5.1)
Sodium: 139 mmol/L (ref 135–145)

## 2014-04-19 LAB — PROTIME-INR
INR: 1.41 (ref 0.00–1.49)
INR: 1.46 (ref 0.00–1.49)
PROTHROMBIN TIME: 17.4 s — AB (ref 11.6–15.2)
Prothrombin Time: 17.8 seconds — ABNORMAL HIGH (ref 11.6–15.2)

## 2014-04-19 LAB — HEPARIN LEVEL (UNFRACTIONATED)
Heparin Unfractionated: 0.43 IU/mL (ref 0.30–0.70)
Heparin Unfractionated: 0.44 IU/mL (ref 0.30–0.70)

## 2014-04-19 LAB — MAGNESIUM: Magnesium: 2.2 mg/dL (ref 1.5–2.5)

## 2014-04-19 MED ORDER — WARFARIN SODIUM 7.5 MG PO TABS
7.5000 mg | ORAL_TABLET | Freq: Once | ORAL | Status: AC
  Administered 2014-04-19: 7.5 mg via ORAL
  Filled 2014-04-19: qty 1

## 2014-04-19 MED ORDER — FAMOTIDINE 20 MG PO TABS
20.0000 mg | ORAL_TABLET | Freq: Two times a day (BID) | ORAL | Status: DC
  Administered 2014-04-19 – 2014-04-23 (×8): 20 mg via ORAL
  Filled 2014-04-19 (×10): qty 1

## 2014-04-19 NOTE — Progress Notes (Signed)
Assessment: 1. Acute kidney injury - d/t shock, recovery phase 2. Volume xs/CHF  Plan - will remove HD catheter.  Appears to be recovering.  We will sign off. Re-consult PRN. Thanks  Subjective: Interval History: Improved UOP  Objective: Vital signs in last 24 hours: Temp:  [97.5 F (36.4 C)-99 F (37.2 C)] 97.6 F (36.4 C) (04/16 1100) Pulse Rate:  [47-111] 90 (04/16 1100) Resp:  [13-29] 20 (04/16 1100) BP: (119-157)/(51-129) 130/67 mmHg (04/16 1100) SpO2:  [86 %-100 %] 97 % (04/16 1100) Weight:  [127.3 kg (280 lb 10.3 oz)] 127.3 kg (280 lb 10.3 oz) (04/16 0200) Weight change: -2.6 kg (-5 lb 11.7 oz)  Intake/Output from previous day: 04/15 0701 - 04/16 0700 In: 1200 [P.O.:720; I.V.:370; IV Piggyback:110] Out: 1975 [OPFYT:2446] Intake/Output this shift: Total I/O In: -  Out: 125 [Urine:125]  General appearance: alert and cooperative Neck: no adenopathy, no carotid bruit, no JVD, supple, symmetrical, trachea midline and right IJ cath Resp: clear to auscultation bilaterally Chest wall: no tenderness Cardio: regular rate and rhythm, S1, S2 normal, no murmur, click, rub or gallop Extremities: extremities normal, atraumatic, no cyanosis or edema  Lab Results:  Recent Labs  04/18/14 0430 04/19/14 0540  WBC 4.8 4.3  HGB 8.4* 8.3*  HCT 31.0* 29.6*  PLT 169 140*   BMET:  Recent Labs  04/18/14 0430 04/19/14 0450  NA 138 139  K 4.0 3.5  CL 102 99  CO2 30 31  GLUCOSE 105* 112*  BUN 30* 37*  CREATININE 1.64* 1.84*  CALCIUM 8.4 8.4   No results for input(s): PTH in the last 72 hours. Iron Studies: No results for input(s): IRON, TIBC, TRANSFERRIN, FERRITIN in the last 72 hours. Studies/Results: No results found.  Scheduled: . sodium chloride   Intravenous Once  . antiseptic oral rinse  7 mL Mouth Rinse BID  . clotrimazole   Topical BID  . famotidine (PEPCID) IV  20 mg Intravenous Q12H  . insulin aspart  0-5 Units Subcutaneous QHS  . insulin aspart  0-9 Units  Subcutaneous TID WC  . metoprolol tartrate  25 mg Oral Q8H  . sodium chloride  10-40 mL Intracatheter Q12H  . warfarin  7.5 mg Oral ONCE-1800  . Warfarin - Pharmacist Dosing Inpatient   Does not apply q1800      LOS: 5 days   Lalla Laham C 04/19/2014,11:36 AM

## 2014-04-19 NOTE — Significant Event (Signed)
Spoke with Drs. Florene Glen and [place] about heparin drip and central lines removal. Received order that heparin drip can be stopped for central lines removal. Heparin drip stopped at 12pm and will resume after lines are remove and patient stable from bleeding.

## 2014-04-19 NOTE — Progress Notes (Signed)
[facility] TEAM 1 - Stepdown/ICU TEAM Progress Note  FEDOR KAZMIERSKI VEH:[id number] DOB: 09-22-1950 DOA: 04/13/2014 PCP: Redge Gainer, MD  Admit HPI / Brief Narrative: 64 y/o male came to the [facility] ED on 4/10 by EMS for "low oxygen and trouble breathing". He was found to be in possible cardiogenic shock, bradycardic, in acute renal failure, hyperkalemic, and in a severe metabolic acidosis.  Significant Events: 4/11 ?cardiogenic shock, multiple pressors, intubated, 4/11 CVVHD 4/11 TTE EF 65-70%, no WMA, RV and RA severely dilated, PA pressure 72mHg 4/13 off pressors  4/13 Extubated 4/14 off CVVHD   HPI/Subjective: Pt is resting comfortably.  He denies cp, sob, n/v, or abdom pain.  He is interested in returning to CIR.    Assessment/Plan:  Acute hypoxic respiratory failure due to acute pulmonary edema continues to improve with volume removal - net negative ~4L since admit - wgt 135kg at admit > 127 presently  Acute circulatory shock - Hypotension Etiology unclear - has resolved - BP stable   Chronic systolic heart failure - Resolved TTE showed improved LVEF to normal (EF 60-65%)   Acute kidney injury, uncertain etiology  Now off CVVH - Nephrology following - good UOP - expecting renal fxn to recover - follow trend - HD cath to be removed today   Chronic atrial fibriliation on warfarin rate controlled - on anticoag w/ heparin > warfarin   Acute encephalopathy due to hypoxemia, heart failure, hypercapnea resolved  Mild dysphagia D3 w/ nectar liquids per SLP eval   Coagulopathy due to warfarin, INR >5 on admit Resolved - warfarin per Pharmacy   DM2 Well controlled   Morbid obesity - Body mass index is 43.94 kg/(m^2).  Code Status: FULL Family Communication: no family present at time of exam Disposition Plan: ?CIR - stable for transfer to tele bed today - cont PT/OT - CIR MD consult - follow Is/Os, wgts, and renal fxn   Consultants: CHF Team  Nephrology    Antibiotics: None presently   DVT prophylaxis: Heparin > Warfarin   Objective: Blood pressure 130/71, pulse 93, temperature 97.5 F (36.4 C), temperature source Axillary, resp. rate 22, height 5' 7"  (1.702 m), weight 127.3 kg (280 lb 10.3 oz), SpO2 90 %.  Intake/Output Summary (Last 24 hours) at 04/19/14 0931 Last data filed at 04/19/14 0745  Gross per 24 hour  Intake  823.5 ml  Output   2100 ml  Net -1276.5 ml   Exam: General: No acute respiratory distress up in chair  Lungs: Clear to auscultation bilaterally without wheezes or crackles Cardiovascular: Regular rate and rhythm without murmur gallop or rub normal S1 and S2 Abdomen: Nontender, nondistended, soft, bowel sounds positive, no rebound, no ascites, no appreciable mass Extremities: No significant cyanosis, clubbing;  1+ edema bilateral lower extremities  Data Reviewed: Basic Metabolic Panel:  Recent Labs Lab 04/14/14 0708  04/16/14 0400 04/16/14 1509 04/17/14 0430 04/17/14 1545 04/18/14 0430 04/19/14 0450  NA  --   < > 135 137 137  137 136 138 139  K  --   < > 3.9 4.0 4.0  4.0 3.9 4.0 3.5  CL  --   < > 94* 99 99  99 103 102 99  CO2  --   < > 31 32 30  30 28 30 31   GLUCOSE  --   < > 180* 149* 119*  119* 196* 105* 112*  BUN  --   < > 34* 33* 27*  27* 25* 30* 37*  CREATININE  --   < >  2.01* 1.69* 1.43*  1.40* 1.42* 1.64* 1.84*  CALCIUM  --   < > 7.9* 8.3* 8.3*  8.2* 7.9* 8.4 8.4  MG 2.0  --  2.3  --  2.4  --  2.6* 2.2  PHOS  --   < > 4.8* 4.6 3.8 3.4 3.6 3.6  < > = values in this interval not displayed.  Liver Function Tests:  Recent Labs Lab 04/13/14 2140  04/16/14 1509 04/17/14 0430 04/17/14 1545 04/18/14 0430 04/19/14 0450  AST 26  --   --   --   --   --   --   ALT 20  --   --   --   --   --   --   ALKPHOS 69  --   --   --   --   --   --   BILITOT 0.6  --   --   --   --   --   --   PROT 5.8*  --   --   --   --   --   --   ALBUMIN 3.1*  < > 2.7* 2.6* 2.7* 2.6* 2.7*  < > = values in  this interval not displayed.  Recent Labs Lab 04/14/14 0221  LIPASE 33  AMYLASE 57   Coags:  Recent Labs Lab 04/15/14 0445 04/16/14 0400 04/17/14 0430 04/18/14 0430 04/18/14 2355  INR 2.39* 1.20 1.25 1.26 1.41    Recent Labs Lab 04/14/14 0708 04/15/14 0445 04/16/14 0400 04/17/14 0430  APTT 71* 53* 44* >200*    CBC:  Recent Labs Lab 04/13/14 2140  04/14/14 0221 04/15/14 0141 04/15/14 0445 04/17/14 0430 04/18/14 0430 04/19/14 0540  WBC 12.5*  --  11.8*  --  6.8 4.5 4.8 4.3  NEUTROABS 9.2*  --   --   --  6.0  --   --   --   HGB 9.8*  < > 9.0* 11.9* 8.8* 8.2* 8.4* 8.3*  HCT 36.6*  < > 33.8* 35.0* 30.8* 30.4* 31.0* 29.6*  MCV 93.6  --  94.7  --  87.7 92.7 90.6 88.4  PLT 471*  --  461*  --  244 192 169 140*  < > = values in this interval not displayed.  Cardiac Enzymes:  Recent Labs Lab 04/14/14 0221 04/14/14 0708 04/14/14 1308  TROPONINI <0.03 0.03 0.22*    CBG:  Recent Labs Lab 04/18/14 0750 04/18/14 1212 04/18/14 1633 04/18/14 2256 04/19/14 0726  GLUCAP 99 134* 109* 100* 99    Recent Results (from the past 240 hour(s))  Blood culture (routine x 2)     Status: None (Preliminary result)   Collection Time: 04/13/14 10:02 PM  Result Value Ref Range Status   Specimen Description BLOOD LEFT HAND  Final   Special Requests BOTTLES DRAWN AEROBIC AND ANAEROBIC 5CC EA  Final   Culture   Final           BLOOD CULTURE RECEIVED NO GROWTH TO DATE CULTURE WILL BE HELD FOR 5 DAYS BEFORE ISSUING A FINAL NEGATIVE REPORT Performed at [facility]    Report Status PENDING  Incomplete  Blood culture (routine x 2)     Status: None (Preliminary result)   Collection Time: 04/13/14 10:08 PM  Result Value Ref Range Status   Specimen Description BLOOD LEFT WRIST  Final   Special Requests BOTTLES DRAWN AEROBIC AND ANAEROBIC 5CC EA  Final   Culture   Final  BLOOD CULTURE RECEIVED NO GROWTH TO DATE CULTURE WILL BE HELD FOR 5 DAYS BEFORE ISSUING A  FINAL NEGATIVE REPORT Performed at [facility]    Report Status PENDING  Incomplete  MRSA PCR Screening     Status: None   Collection Time: 04/14/14  2:42 AM  Result Value Ref Range Status   MRSA by PCR NEGATIVE NEGATIVE Final    Comment:        The GeneXpert MRSA Assay (FDA approved for NASAL specimens only), is one component of a comprehensive MRSA colonization surveillance program. It is not intended to diagnose MRSA infection nor to guide or monitor treatment for MRSA infections.      Studies:   Recent x-ray studies have been reviewed in detail by the Attending Physician  Scheduled Meds:  Scheduled Meds: . sodium chloride   Intravenous Once  . antiseptic oral rinse  7 mL Mouth Rinse BID  . clotrimazole   Topical BID  . famotidine (PEPCID) IV  20 mg Intravenous Q12H  . insulin aspart  0-5 Units Subcutaneous QHS  . insulin aspart  0-9 Units Subcutaneous TID WC  . metoprolol tartrate  25 mg Oral Q8H  . sodium chloride  10-40 mL Intracatheter Q12H  . Warfarin - Pharmacist Dosing Inpatient   Does not apply q1800    Time spent on care of this patient: 35 mins   [facility] T , MD   [facility] Office  [phone] Pager - Text Page per Amion as per below:  On-Call/Text Page:      [email or web address]      password [email or web address]  If 7PM-7AM, please contact night-coverage [email or web address] Password [email or web address] 04/19/2014, 9:31 AM   LOS: 5 days

## 2014-04-19 NOTE — Progress Notes (Signed)
Patient Name: William Flores Date of Encounter: 04/19/2014  Principal Problem:   Cardiogenic shock Active Problems:   Acute-on-chronic renal failure   Atrial fibrillation with slow ventricular response   Bradycardia   Hyperkalemia   AKI (acute kidney injury)   Metabolic acidosis   Shock circulatory   Length of Stay: 5  SUBJECTIVE  Net negative fluid balance and weight dropping. AFib with good rate control. Deconditioned and still dyspneic. No angina. On IV heparin, INR 1.4  CURRENT MEDS . sodium chloride   Intravenous Once  . antiseptic oral rinse  7 mL Mouth Rinse BID  . clotrimazole   Topical BID  . famotidine (PEPCID) IV  20 mg Intravenous Q12H  . insulin aspart  0-5 Units Subcutaneous QHS  . insulin aspart  0-9 Units Subcutaneous TID WC  . metoprolol tartrate  25 mg Oral Q8H  . sodium chloride  10-40 mL Intracatheter Q12H  . warfarin  7.5 mg Oral ONCE-1800  . Warfarin - Pharmacist Dosing Inpatient   Does not apply q1800    OBJECTIVE   Intake/Output Summary (Last 24 hours) at 04/19/14 1034 Last data filed at 04/19/14 0745  Gross per 24 hour  Intake  763.5 ml  Output   2100 ml  Net -1336.5 ml   Filed Weights   04/17/14 0500 04/18/14 0415 04/19/14 0200  Weight: 287 lb 11.2 oz (130.5 kg) 286 lb 6 oz (129.9 kg) 280 lb 10.3 oz (127.3 kg)    PHYSICAL EXAM Filed Vitals:   04/19/14 0500 04/19/14 0600 04/19/14 0700 04/19/14 0800  BP: 143/79 133/64 134/68 130/71  Pulse: 72 80 90 93  Temp:   97.5 F (36.4 C)   TempSrc:   Axillary   Resp: 13 17 19 22   Height:      Weight:      SpO2: 92% 95% 98% 90%   General: Alert, oriented x3, no distress, slightly tachypneic Head: no evidence of trauma, PERRL, EOMI, no exophtalmos or lid lag, no myxedema, no xanthelasma; normal ears, nose and oropharynx Neck: normal jugular venous pulsations and no hepatojugular reflux; brisk carotid pulses without delay and no carotid bruits Chest: clear to auscultation, no signs of  consolidation by percussion or palpation, normal fremitus, symmetrical and full respiratory excursions Cardiovascular: normal position and quality of the apical impulse, irregular rhythm, normal first and second heart sounds, no rubs or gallops, no murmur Abdomen: no tenderness or distention, no masses by palpation, no abnormal pulsatility or arterial bruits, normal bowel sounds, no hepatosplenomegaly Extremities: no clubbing, cyanosis, 1-2+ ankle edema; 2+ radial, ulnar and brachial pulses bilaterally; 2+ right femoral, posterior tibial and dorsalis pedis pulses; 2+ left femoral, posterior tibial and dorsalis pedis pulses; no subclavian or femoral bruits Neurological: grossly nonfocal  LABS  CBC  Recent Labs  04/18/14 0430 04/19/14 0540  WBC 4.8 4.3  HGB 8.4* 8.3*  HCT 31.0* 29.6*  MCV 90.6 88.4  PLT 169 416*   Basic Metabolic Panel  Recent Labs  04/18/14 0430 04/19/14 0450  NA 138 139  K 4.0 3.5  CL 102 99  CO2 30 31  GLUCOSE 105* 112*  BUN 30* 37*  CREATININE 1.64* 1.84*  CALCIUM 8.4 8.4  MG 2.6* 2.2  PHOS 3.6 3.6   Liver Function Tests  Recent Labs  04/18/14 0430 04/19/14 0450  ALBUMIN 2.6* 2.7*    Radiology Studies Imaging results have been reviewed and No results found.  TELE AF, rate 90s   ASSESSMENT AND PLAN  No additional  recommendations for inpatient care. Continue IV heparin. DC when INR 2.0. When ready for DC, needs cardiology f/u in [facility] (saw Dr. [name] years ago, but unable to travel to [place]). Please reconsult as needed.   Sanda Klein, MD, [facility] [phone] office [phone] pager 04/19/2014 10:34 AM

## 2014-04-19 NOTE — Progress Notes (Signed)
ANTICOAGULATION CONSULT NOTE - Follow Up Consult  Pharmacy Consult for Heparin  Indication: atrial fibrillation  Allergies  Allergen Reactions  . Codeine     Headache    Patient Measurements: Height: 5' 7"  (170.2 cm) Weight: 286 lb 6 oz (129.9 kg) IBW/kg (Calculated) : 66.1  Vital Signs: Temp: 98.9 F (37.2 C) (04/15 1600) Temp Source: Oral (04/15 1600) BP: 128/60 mmHg (04/15 1751) Pulse Rate: 95 (04/15 2300)  Labs:  Recent Labs  04/16/14 0400  04/17/14 0430 04/17/14 1545 04/17/14 2137 04/18/14 0430 04/18/14 1705 04/18/14 2355  HGB  --   --  8.2*  --   --  8.4*  --   --   HCT  --   --  30.4*  --   --  31.0*  --   --   PLT  --   --  192  --   --  169  --   --   APTT 44*  --  >200*  --   --   --   --   --   LABPROT 15.4*  --  15.8*  --   --  15.9*  --  17.4*  INR 1.20  --  1.25  --   --  1.26  --  1.41  HEPARINUNFRC  --   < > 0.39  --  0.22*  --  0.19* 0.44  CREATININE 2.01*  < > 1.43*  1.40* 1.42*  --  1.64*  --   --   < > = values in this interval not displayed.  Estimated Creatinine Clearance: 60.5 mL/min (by C-G formula based on Cr of 1.64).  Assessment: Therapeutic heparin level after rate increase Goal of Therapy:  Heparin level 0.3-0.7 units/ml Monitor platelets by anticoagulation protocol: Yes   Plan:  -Continue heparin drip at 1900 units/hr -0600 HL -Daily CBC/HL -Monitor for bleeding  Narda Bonds 04/19/2014,1:03 AM

## 2014-04-19 NOTE — Progress Notes (Signed)
ANTICOAGULATION CONSULT NOTE - Follow Up Consult  Pharmacy Consult for Heparin/warfarin Indication: atrial fibrillation  Allergies  Allergen Reactions  . Codeine     Headache     Patient Measurements: Height: 5' 7"  (170.2 cm) Weight: 280 lb 10.3 oz (127.3 kg) IBW/kg (Calculated) : 66.1  Vital Signs: Temp: 97.5 F (36.4 C) (04/16 0700) Temp Source: Axillary (04/16 0700) BP: 130/71 mmHg (04/16 0800) Pulse Rate: 93 (04/16 0800)  Labs:  Recent Labs  04/17/14 0430 04/17/14 1545  04/18/14 0430 04/18/14 1705 04/18/14 2355 04/19/14 0450 04/19/14 0540  HGB 8.2*  --   --  8.4*  --   --   --  8.3*  HCT 30.4*  --   --  31.0*  --   --   --  29.6*  PLT 192  --   --  169  --   --   --  140*  APTT >200*  --   --   --   --   --   --   --   LABPROT 15.8*  --   --  15.9*  --  17.4* 17.8*  --   INR 1.25  --   --  1.26  --  1.41 1.46  --   HEPARINUNFRC 0.39  --   < >  --  0.19* 0.44 0.43  --   CREATININE 1.43*  1.40* 1.42*  --  1.64*  --   --  1.84*  --   < > = values in this interval not displayed.  Estimated Creatinine Clearance: 53.3 mL/min (by C-G formula based on Cr of 1.84).   Medications: . sodium chloride Stopped (04/15/14 0700)  . heparin 1,900 Units/hr (04/19/14 0924)  . dialysis replacement fluid (prismasate) Stopped (04/17/14 1705)  . dialysis replacement fluid (prismasate) Stopped (04/17/14 1705)  . dialysate (PRISMASATE) Stopped (04/17/14 1705)    Assessment: 49 yom continuing on heparin/warfarin for afib. INR supratherapeutic 6.73 on admit, now stable 1.25>>1.26 (s/p vit k/FFP on 4/13).  Warfarin held 4/13 due to inability to swallow. Restarted 4/14. CBC stable. Some oozing 4/14 from IV site but ok to restart anticoag per MD 4/14. No further bleeding today per nurse.  AM heparin level therapeutic at 0.43, and INR slow to rise s/p 2 doses of Coumadin.  Remains subtherapeutic (1.46).  PTA warfarin dosing says 5-16m qd but med rec says patient was not taking  med.  Goal of Therapy:  Heparin level 0.3-0.7 units/ml Monitor platelets by anticoagulation protocol: Yes   Plan:  Continue IV heparin at 1900 units/hr. Warf [id number] tonight Daily HL/CBC Daily INR/PT Monitor s/sx of bleeding  JeUvaldo RisingBCPS  Clinical Pharmacist Pager [phone]/16/2016 10:04 AM

## 2014-04-20 DIAGNOSIS — R1310 Dysphagia, unspecified: Secondary | ICD-10-CM

## 2014-04-20 DIAGNOSIS — I10 Essential (primary) hypertension: Secondary | ICD-10-CM

## 2014-04-20 DIAGNOSIS — E1165 Type 2 diabetes mellitus with hyperglycemia: Secondary | ICD-10-CM

## 2014-04-20 DIAGNOSIS — E1129 Type 2 diabetes mellitus with other diabetic kidney complication: Secondary | ICD-10-CM | POA: Diagnosis present

## 2014-04-20 LAB — CULTURE, BLOOD (ROUTINE X 2)
Culture: NO GROWTH
Culture: NO GROWTH

## 2014-04-20 LAB — RENAL FUNCTION PANEL
ALBUMIN: 2.8 g/dL — AB (ref 3.5–5.2)
Anion gap: 8 (ref 5–15)
BUN: 41 mg/dL — ABNORMAL HIGH (ref 6–23)
CALCIUM: 8.7 mg/dL (ref 8.4–10.5)
CHLORIDE: 100 mmol/L (ref 96–112)
CO2: 32 mmol/L (ref 19–32)
Creatinine, Ser: 2.03 mg/dL — ABNORMAL HIGH (ref 0.50–1.35)
GFR calc Af Amer: 39 mL/min — ABNORMAL LOW (ref >90)
GFR, EST NON AFRICAN AMERICAN: 33 mL/min — AB (ref >90)
Glucose, Bld: 112 mg/dL — ABNORMAL HIGH (ref 70–99)
Phosphorus: 3.9 mg/dL (ref 2.3–4.6)
Potassium: 3.6 mmol/L (ref 3.5–5.1)
Sodium: 140 mmol/L (ref 135–145)

## 2014-04-20 LAB — CBC
HEMATOCRIT: 28.8 % — AB (ref 39.0–52.0)
Hemoglobin: 8.1 g/dL — ABNORMAL LOW (ref 13.0–17.0)
MCH: 24.8 pg — ABNORMAL LOW (ref 26.0–34.0)
MCHC: 28.1 g/dL — AB (ref 30.0–36.0)
MCV: 88.3 fL (ref 78.0–100.0)
PLATELETS: 121 10*3/uL — AB (ref 150–400)
RBC: 3.26 MIL/uL — ABNORMAL LOW (ref 4.22–5.81)
RDW: 22.2 % — ABNORMAL HIGH (ref 11.5–15.5)
WBC: 3.6 10*3/uL — ABNORMAL LOW (ref 4.0–10.5)

## 2014-04-20 LAB — PROTIME-INR
INR: 1.43 (ref 0.00–1.49)
PROTHROMBIN TIME: 17.6 s — AB (ref 11.6–15.2)

## 2014-04-20 LAB — GLUCOSE, CAPILLARY
Glucose-Capillary: 108 mg/dL — ABNORMAL HIGH (ref 70–99)
Glucose-Capillary: 122 mg/dL — ABNORMAL HIGH (ref 70–99)
Glucose-Capillary: 132 mg/dL — ABNORMAL HIGH (ref 70–99)

## 2014-04-20 LAB — HEPARIN LEVEL (UNFRACTIONATED): HEPARIN UNFRACTIONATED: 0.45 [IU]/mL (ref 0.30–0.70)

## 2014-04-20 LAB — MAGNESIUM: Magnesium: 2.3 mg/dL (ref 1.5–2.5)

## 2014-04-20 MED ORDER — DILTIAZEM HCL ER COATED BEADS 180 MG PO CP24
180.0000 mg | ORAL_CAPSULE | Freq: Every day | ORAL | Status: DC
  Administered 2014-04-20 – 2014-04-22 (×3): 180 mg via ORAL
  Filled 2014-04-20 (×3): qty 1

## 2014-04-20 MED ORDER — RESOURCE THICKENUP CLEAR PO POWD: ORAL | Status: DC

## 2014-04-20 MED ORDER — WARFARIN SODIUM 10 MG PO TABS
10.0000 mg | ORAL_TABLET | Freq: Once | ORAL | Status: AC
  Administered 2014-04-20: 10 mg via ORAL
  Filled 2014-04-20: qty 1

## 2014-04-20 MED ORDER — BENZONATATE 100 MG PO CAPS
100.0000 mg | ORAL_CAPSULE | Freq: Two times a day (BID) | ORAL | Status: DC | PRN
  Administered 2014-04-20 – 2014-04-22 (×2): 100 mg via ORAL
  Filled 2014-04-20 (×6): qty 1

## 2014-04-20 NOTE — Evaluation (Signed)
Occupational Therapy Evaluation Patient Details Name: William Flores MRN: [id number] DOB: February 27, 1950 Today's Date: 04/20/2014    History of Present Illness 64 y/o male came to the [facility] ED on 4/10 by EMS for "low oxygen and trouble breathing". He was found to be in cardiogenic shock, bradycardic, in acute renal failure, hyperkalemic, and with lactic acidosis. Recent admission2/10 with respiratory failure. PMH of Afib, sleep apnea, heart failure. Intubated  4/10-4/13 with CVVHD stopped 4/14   Clinical Impression   This 64 yo male admitted with above presents to acute OT with generalized weakness, decreased mobility, decreased balance, and drop in O2 sats with activity all affecting his ability to care for himself at an independent level as he was as of this past Jan. He will benefit from acute OT with follow up OT on CIR to return home at a Mod I/Independent level for BADLs and IADLs.    Follow Up Recommendations  CIR    Equipment Recommendations   (rollator)       Precautions / Restrictions Precautions Precautions: Fall Restrictions Weight Bearing Restrictions: No      Mobility Bed Mobility               General bed mobility comments: Pt sitting on EOB upon my arrival  Transfers Overall transfer level: Needs assistance   Transfers: Sit to/from Stand Sit to Stand: Min guard         General transfer comment: Ambulated 154fet wtih RW with min guard A         ADL Overall ADL's : Needs assistance/impaired Eating/Feeding: Independent;Sitting   Grooming: Set up;Sitting   Upper Body Bathing: Set up;Sitting   Lower Body Bathing: Min guard;Sit to/from stand   Upper Body Dressing : Set up;Sitting   Lower Body Dressing: Min guard;Sit to/from stand   Toilet Transfer: Min guard;Ambulation;RW   Toileting- CWater quality scientistand Hygiene: Min guard;Sit to/from stand               Vision Additional Comments: No change from baseline          Pertinent  Vitals/Pain Pain Assessment: No/denies pain     Hand Dominance Right   Extremity/Trunk Assessment Upper Extremity Assessment Upper Extremity Assessment: Overall WFL for tasks assessed   Lower Extremity Assessment Lower Extremity Assessment: Overall WFL for tasks assessed       Communication Communication Communication: No difficulties   Cognition Arousal/Alertness: Awake/alert Behavior During Therapy: WFL for tasks assessed/performed Overall Cognitive Status: Within Functional Limits for tasks assessed                                Home Living Family/patient expects to be discharged to:: Inpatient rehab Living Arrangements: Alone Available Help at Discharge: Family;Friend(s);Available PRN/intermittently Type of Home: House Home Access: Stairs to enter ECenterPoint Energyof Steps: 3 Entrance Stairs-Rails: Left;Right Home Layout: One level     Bathroom Shower/Tub: Tub/shower unit;Curtain Shower/tub characteristics: CArchitectural technologist Standard     Home Equipment: Cane - single point;Bedside commode;Shower seat;Walker - standard          Prior Functioning/Environment Level of Independence: Independent        Comments: just retired in Jan admitted to the hospital in Feb with short SNF stay prior to home and then this readmission.     OT Diagnosis: Generalized weakness   OT Problem List: Decreased strength;Cardiopulmonary status limiting activity;Impaired balance (sitting and/or standing);Obesity;Decreased knowledge of use of  DME or AE   OT Treatment/Interventions: Self-care/ADL training;Patient/family education;Balance training;Energy conservation;DME and/or AE instruction    OT Goals(Current goals can be found in the care plan section) Acute Rehab OT Goals Patient Stated Goal: go hunting and fishing OT Goal Formulation: With patient Time For Goal Achievement: 04/27/14 Potential to Achieve Goals: Good  OT Frequency: Min 2X/week               End of Session Equipment Utilized During Treatment: Oxygen (3 liters) Nurse Communication:  (turned O2 down from 4 to 3 liters)  Activity Tolerance: Patient tolerated treatment well Patient left: with call bell/phone within reach (sitting EOB)   Time: 3494-9447 OT Time Calculation (min): 22 min Charges:  OT General Charges $OT Visit: 1 Procedure OT Evaluation $Initial OT Evaluation Tier I: 1 Procedure  Almon Register [phone] 04/20/2014, 9:44 AM

## 2014-04-20 NOTE — Progress Notes (Signed)
PROGRESS NOTE  William Flores LXB:[id number] DOB: 1950/09/29 DOA: 04/13/2014 PCP: Redge Gainer, MD  HPI/Recap of past 70 hours: 64 year old white male with past medical history of atrial fibrillation on chronic Coumadin, diabetes mellitus and hypertension admitted on 4/10 for shortness of breath and found to be in acute renal failure, cardiogenic shock and severe hypokalemia. Patient transferred to ICU and intubated on 4/11 with nephrology and cardiology consulted, started on CVVHD. 2-D echo noted severe dilatation of right atrium and ventricle, however ejection fraction preserved which was actually an improvement. Patient was able to be weaned off of pressors in the next few days and extubated on 4/13. His renal function continued to improve and by 4/14, hemodialysis was discontinued. Overall mentation was stable, although was noted to have some swallowing difficulties and put on a dysphagia 1 with nectar thick liquid diet by speech therapy.Patient was transferred to the hospitalist service on 4/16 and moved from stepdown unit to the floor that evening.  Patient today doing okay. No complaints. States breathing is okay.  Assessment/Plan: Principal Problem:   Acute respiratory failure with hypoxemia secondary to pulmonary edema from Cardiogenic shock with secondary hypotension: Etiology unclear. Has resolved. Has diuresed approximately 4 L now.  Chronic systolic heart failure: Resolved. Echocardiogram I she shows improvement of ejection fraction.  Active Problems:   Acute-on-chronic renal failure   Atrial fibrillation with slow ventricular response on chronic anticoagulation. On heparin with Coumadin restarted. Monitoring platelet counts carefully as they've been trending down, but he is not thrombocytopenic.digoxin on hold for now.   Bradycardia:secondary metabolic acidosis.heart rate has been trending up so we'll gently restart Cardizem.   Hyperkalemia: Stable   AKI (acute kidney injury)in  the setting of stage III chronic kidney disease: Creatinine less than a month ago was 1.35.during this hospitalization peaked as high as 5.6 and has continued to come down, although history and it up over the last 3 days. Continue to monitor.he continues to make good urine.   Metabolic acidosis: Resolved    Hypertension: Stable. Will ask cardiology to follow up on medications to recommend which should be continued on discharge.? Lasix. Stopping ACE inhibitor.    DM (diabetes mellitus), type 2, uncontrolled, with renal complications: C1U 2 months ago at 8.3. Resume home meds on discharge. Following CBGs.    Dysphagia, likely residual from acute encephalopathy due to hypoxemia, heart failure and hypercapnia all of which seem to have resolved: on a restricted diet  Morbid obesity: patient meets criteria with BMI greater than 40 Code Status: full code  Family Communication: I left a message with family  Disposition Plan: skilled nursing versus inpatient rehabilitation. Decision to be made tomorrow. If skilled nursing, discharge when renal function felt to be completely stabilized .   Consultants:  Critical care  Cardiology  Nephrology  Inpatient rehabilitation  Procedures:  Ejection fraction 60-65%, actually improved from 2 months prior. No thrombus. Right ventricular volume and pressure overload with moderate increase of pulmonary pressure  Antibiotics:  none   Objective: BP 107/62 mmHg  Pulse 80  Temp(Src) 97.9 F (36.6 C) (Oral)  Resp 19  Ht 5' 7"  (1.702 m)  Wt 126.8 kg (279 lb 8.7 oz)  BMI 43.77 kg/m2  SpO2 93%  Intake/Output Summary (Last 24 hours) at 04/20/14 1623 Last data filed at 04/20/14 1300  Gross per 24 hour  Intake    480 ml  Output      0 ml  Net    480 ml   Filed  Weights   04/18/14 0415 04/19/14 0200 04/20/14 0515  Weight: 129.9 kg (286 lb 6 oz) 127.3 kg (280 lb 10.3 oz) 126.8 kg (279 lb 8.7 oz)    Exam:   General:  Alert and oriented 3, no  acute distress  Cardiovascular: irregular rhythm, rate controlled  Respiratory: decreased breath sounds throughout secondary to body habitus  Abdomen: soft, obese, nontender, hypoactive bowel sounds  Musculoskeletal: no clubbing or cyanosis, trace edema   Data Reviewed: Basic Metabolic Panel:  Recent Labs Lab 04/16/14 0400  04/17/14 0430 04/17/14 1545 04/18/14 0430 04/19/14 0450 04/20/14 0525  NA 135  < > 137  137 136 138 139 140  K 3.9  < > 4.0  4.0 3.9 4.0 3.5 3.6  CL 94*  < > 99  99 103 102 99 100  CO2 31  < > 30  30 28 30 31  32  GLUCOSE 180*  < > 119*  119* 196* 105* 112* 112*  BUN 34*  < > 27*  27* 25* 30* 37* 41*  CREATININE 2.01*  < > 1.43*  1.40* 1.42* 1.64* 1.84* 2.03*  CALCIUM 7.9*  < > 8.3*  8.2* 7.9* 8.4 8.4 8.7  MG 2.3  --  2.4  --  2.6* 2.2 2.3  PHOS 4.8*  < > 3.8 3.4 3.6 3.6 3.9  < > = values in this interval not displayed. Liver Function Tests:  Recent Labs Lab 04/13/14 2140  04/17/14 0430 04/17/14 1545 04/18/14 0430 04/19/14 0450 04/20/14 0525  AST 26  --   --   --   --   --   --   ALT 20  --   --   --   --   --   --   ALKPHOS 69  --   --   --   --   --   --   BILITOT 0.6  --   --   --   --   --   --   PROT 5.8*  --   --   --   --   --   --   ALBUMIN 3.1*  < > 2.6* 2.7* 2.6* 2.7* 2.8*  < > = values in this interval not displayed.  Recent Labs Lab 04/14/14 0221  LIPASE 33  AMYLASE 57   No results for input(s): AMMONIA in the last 168 hours. CBC:  Recent Labs Lab 04/13/14 2140  04/15/14 0445 04/17/14 0430 04/18/14 0430 04/19/14 0540 04/20/14 0525  WBC 12.5*  < > 6.8 4.5 4.8 4.3 3.6*  NEUTROABS 9.2*  --  6.0  --   --   --   --   HGB 9.8*  < > 8.8* 8.2* 8.4* 8.3* 8.1*  HCT 36.6*  < > 30.8* 30.4* 31.0* 29.6* 28.8*  MCV 93.6  < > 87.7 92.7 90.6 88.4 88.3  PLT 471*  < > 244 192 169 140* 121*  < > = values in this interval not displayed. Cardiac Enzymes:    Recent Labs Lab 04/14/14 0221 04/14/14 0708 04/14/14 1308    TROPONINI <0.03 0.03 0.22*   BNP (last 3 results)  Recent Labs  02/15/14 0350 04/13/14 2140 04/14/14 0222  BNP 75.9 467.9* 333.2*    ProBNP (last 3 results)  Recent Labs  08/05/13 1403 10/28/13 1746  PROBNP 376.6* 325.7*    CBG:  Recent Labs Lab 04/19/14 1112 04/19/14 1635 04/19/14 2251 04/20/14 0650 04/20/14 1122  GLUCAP 178* 121* 136* 108* 122*  Recent Results (from the past 240 hour(s))  Blood culture (routine x 2)     Status: None   Collection Time: 04/13/14 10:02 PM  Result Value Ref Range Status   Specimen Description BLOOD LEFT HAND  Final   Special Requests BOTTLES DRAWN AEROBIC AND ANAEROBIC 5CC EA  Final   Culture   Final    NO GROWTH 5 DAYS Performed at [facility]    Report Status 04/20/2014 FINAL  Final  Blood culture (routine x 2)     Status: None   Collection Time: 04/13/14 10:08 PM  Result Value Ref Range Status   Specimen Description BLOOD LEFT WRIST  Final   Special Requests BOTTLES DRAWN AEROBIC AND ANAEROBIC 5CC EA  Final   Culture   Final    NO GROWTH 5 DAYS Performed at [facility]    Report Status 04/20/2014 FINAL  Final  MRSA PCR Screening     Status: None   Collection Time: 04/14/14  2:42 AM  Result Value Ref Range Status   MRSA by PCR NEGATIVE NEGATIVE Final    Comment:        The GeneXpert MRSA Assay (FDA approved for NASAL specimens only), is one component of a comprehensive MRSA colonization surveillance program. It is not intended to diagnose MRSA infection nor to guide or monitor treatment for MRSA infections.      Studies: No results found.  Scheduled Meds: . antiseptic oral rinse  7 mL Mouth Rinse BID  . clotrimazole   Topical BID  . famotidine  20 mg Oral BID  . insulin aspart  0-5 Units Subcutaneous QHS  . insulin aspart  0-9 Units Subcutaneous TID WC  . metoprolol tartrate  25 mg Oral Q8H  . warfarin  10 mg Oral ONCE-1800  . Warfarin - Pharmacist Dosing Inpatient   Does not  apply q1800    Continuous Infusions: . heparin Stopped (04/19/14 1200)     Time spent: 25 minutes  [facility] Pager [phone]. If 7PM-7AM, please contact night-coverage at [email or web address], password [facility] 04/20/2014, 4:23 PM  LOS: 6 days

## 2014-04-20 NOTE — Clinical Social Work Psychosocial (Signed)
Clinical Social Work Department BRIEF PSYCHOSOCIAL ASSESSMENT 04/20/2014  Patient:  William Flores, William Flores     Account Number:  [id number]     Admit date:  04/13/2014  Clinical Social Worker:  Daiva Huge  Date/Time:  04/20/2014 02:39 PM  Referred by:  Physician  Date Referred:  04/20/2014 Referred for  SNF Placement   Other Referral:   Interview type:  Patient Other interview type:    PSYCHOSOCIAL DATA Living Status:  ALONE Admitted from facility:   Level of care:   Primary support name:  son- Primary support relationship to patient:  FAMILY Degree of support available:   good    CURRENT CONCERNS Current Concerns  Post-Acute Placement   Other Concerns:    SOCIAL WORK ASSESSMENT / PLAN CSW spoke with patient today to discuss ?SNF. CSW introduced self and role- patient is hopeful for CIR but is agreeable to SNF search for options as well- Patient reports to CSW that he was at [facility] for about 3 weeks earlier this year. "I retired in January and have been in and out of the hospital ever since". Patient appears somewhat frustrated with his hospitalizations and not being home- he is hoping to get well and be able to enjoy his retirement. CSW offered support and encouragement-   Assessment/plan status:  Other - See comment Other assessment/ plan:   Information/referral to community resources:   SNF list    PATIENT'S/FAMILY'S RESPONSE TO PLAN OF CARE: Patient hopeful for CIR rehab at [facility]- he is open to SNF search in [place] would be [facility] and [facility] in [place].  CSW will pursue options for SNF and advise       Eduard Clos, MSW, [employer] Weekend coverage [phone]

## 2014-04-20 NOTE — Progress Notes (Signed)
ANTICOAGULATION CONSULT NOTE - Follow Up Consult  Pharmacy Consult for Heparin/warfarin Indication: atrial fibrillation  Allergies  Allergen Reactions  . Codeine     Headache     Patient Measurements: Height: 5' 7"  (170.2 cm) Weight: 279 lb 8.7 oz (126.8 kg) IBW/kg (Calculated) : 66.1  Vital Signs: Temp: 97.9 F (36.6 C) (04/17 0515) Temp Source: Oral (04/17 0515) BP: 107/62 mmHg (04/17 0515) Pulse Rate: 80 (04/17 0515)  Labs:  Recent Labs  04/18/14 0430  04/18/14 2355 04/19/14 0450 04/19/14 0540 04/20/14 0525  HGB 8.4*  --   --   --  8.3* 8.1*  HCT 31.0*  --   --   --  29.6* 28.8*  PLT 169  --   --   --  140* 121*  LABPROT 15.9*  --  17.4* 17.8*  --  17.6*  INR 1.26  --  1.41 1.46  --  1.43  HEPARINUNFRC  --   < > 0.44 0.43  --  0.45  CREATININE 1.64*  --   --  1.84*  --  2.03*  < > = values in this interval not displayed.  Estimated Creatinine Clearance: 48.2 mL/min (by C-G formula based on Cr of 2.03).   Medications: . heparin Stopped (04/19/14 1200)    Assessment: 71 yom continuing on heparin/warfarin for afib. INR supratherapeutic 6.73 on admit, now stable 1.25>>1.26 (s/p vit k/FFP on 4/13).  Warfarin held 4/13 due to inability to swallow. Restarted 4/14. CBC stable. Some oozing 4/14 from IV site but ok to restart anticoag per MD 4/14. No further bleeding today per nurse.  AM heparin level therapeutic at 0.45. INR trended down after a dose increase yesterday.  PTA warfarin dosing says 5-81m qd but med rec says patient was not taking med.  Goal of Therapy:  Heparin level 0.3-0.7 units/ml Monitor platelets by anticoagulation protocol: Yes  INR 2-3   Plan:   Continue IV heparin at 1900 units/hr. Warf 132mx1 tonight Daily HL/CBC Daily INR/PT  MiOnnie BoerPharmD Pager: [phone]/17/2016 1:35 PM

## 2014-04-20 NOTE — Clinical Social Work Placement (Addendum)
Clinical Social Work Department CLINICAL SOCIAL WORK PLACEMENT NOTE 04/20/2014  Patient:  William Flores, William Flores  Account Number:  [id number] Admit date:  04/13/2014  Clinical Social Worker:  Daiva Huge  Date/time:  04/20/2014 02:38 PM  Clinical Social Work is seeking post-discharge placement for this patient at the following level of care:   SKILLED NURSING   (*CSW will update this form in Epic as items are completed)   04/20/2014  Patient/family provided with [place] Department of Clinical Social Work's list of facilities offering this level of care within the geographic area requested by the patient (or if unable, by the patient's family).  04/20/2014  Patient/family informed of their freedom to choose among providers that offer the needed level of care, that participate in Medicare, Medicaid or managed care program needed by the patient, have an available bed and are willing to accept the patient.  04/20/2014  Patient/family informed of [facility]' ownership interest in [facility], as well as of the fact that they are under no obligation to receive care at this facility.  PASARR submitted to EDS on 04/20/2014 PASARR number received on 04/20/2014  FL2 transmitted to all facilities in geographic area requested by pt/family on  04/20/2014 FL2 transmitted to all facilities within larger geographic area on   Patient informed that his/her managed care company has contracts with or will negotiate with  certain facilities, including the following:     Patient/family informed of bed offers received:  04/21/14 Patient chooses bed at [facility] Physician recommends and patient chooses bed at    Patient to be transferred to [facility] on  04/23/14 Patient to be transferred to facility by [facility] Patient and family notified of transfer on - NA pt to notify Name of family member notified:  NA  The following physician request were entered in  Epic:   Additional Comments: Eduard Clos, MSW, [facility] Weekend coverage [phone]

## 2014-04-21 LAB — MAGNESIUM: MAGNESIUM: 2.2 mg/dL (ref 1.5–2.5)

## 2014-04-21 LAB — CBC
HCT: 29.9 % — ABNORMAL LOW (ref 39.0–52.0)
HEMOGLOBIN: 8.4 g/dL — AB (ref 13.0–17.0)
MCH: 25.1 pg — ABNORMAL LOW (ref 26.0–34.0)
MCHC: 28.1 g/dL — AB (ref 30.0–36.0)
MCV: 89.3 fL (ref 78.0–100.0)
Platelets: 122 10*3/uL — ABNORMAL LOW (ref 150–400)
RBC: 3.35 MIL/uL — ABNORMAL LOW (ref 4.22–5.81)
RDW: 22.3 % — ABNORMAL HIGH (ref 11.5–15.5)
WBC: 4.3 10*3/uL (ref 4.0–10.5)

## 2014-04-21 LAB — GLUCOSE, CAPILLARY
GLUCOSE-CAPILLARY: 113 mg/dL — AB (ref 70–99)
GLUCOSE-CAPILLARY: 133 mg/dL — AB (ref 70–99)
GLUCOSE-CAPILLARY: 136 mg/dL — AB (ref 70–99)
Glucose-Capillary: 147 mg/dL — ABNORMAL HIGH (ref 70–99)
Glucose-Capillary: 121 mg/dL — ABNORMAL HIGH (ref 70–99)

## 2014-04-21 LAB — RENAL FUNCTION PANEL
Albumin: 3.2 g/dL — ABNORMAL LOW (ref 3.5–5.2)
Anion gap: 9 (ref 5–15)
BUN: 43 mg/dL — ABNORMAL HIGH (ref 6–23)
CO2: 31 mmol/L (ref 19–32)
CREATININE: 1.93 mg/dL — AB (ref 0.50–1.35)
Calcium: 9.1 mg/dL (ref 8.4–10.5)
Chloride: 102 mmol/L (ref 96–112)
GFR calc Af Amer: 41 mL/min — ABNORMAL LOW (ref >90)
GFR calc non Af Amer: 36 mL/min — ABNORMAL LOW (ref >90)
GLUCOSE: 123 mg/dL — AB (ref 70–99)
Phosphorus: 4.2 mg/dL (ref 2.3–4.6)
Potassium: 3.8 mmol/L (ref 3.5–5.1)
Sodium: 142 mmol/L (ref 135–145)

## 2014-04-21 LAB — PROTIME-INR
INR: 1.43 (ref 0.00–1.49)
PROTHROMBIN TIME: 17.6 s — AB (ref 11.6–15.2)

## 2014-04-21 LAB — HEPARIN LEVEL (UNFRACTIONATED): Heparin Unfractionated: 0.46 IU/mL (ref 0.30–0.70)

## 2014-04-21 MED ORDER — FUROSEMIDE 10 MG/ML IJ SOLN
20.0000 mg | Freq: Two times a day (BID) | INTRAMUSCULAR | Status: DC
  Administered 2014-04-21 – 2014-04-23 (×4): 20 mg via INTRAVENOUS
  Filled 2014-04-21 (×6): qty 2

## 2014-04-21 MED ORDER — WARFARIN SODIUM 10 MG PO TABS
10.0000 mg | ORAL_TABLET | Freq: Once | ORAL | Status: AC
  Administered 2014-04-21: 10 mg via ORAL
  Filled 2014-04-21: qty 1

## 2014-04-21 NOTE — Discharge Instructions (Signed)

## 2014-04-21 NOTE — Progress Notes (Signed)
   04/21/14 1100  Clinical Encounter Type  Visited With Patient  Visit Type Initial  Advance Directives (For Healthcare)  Does patient have an advance directive? No  Would patient like information on creating an advanced directive? Yes - Educational materials given   Chaplain was referred to patient via spiritual care consult. Consult indicated patient had some questions regarding the appointment of a healthcare power of attorney. Chaplain visited with patient for roughly 15 minutes and went over the contents of an advanced directive with patient. Chaplain gave patient a blank copy of an advanced directive form and let him know that when he completed the sections to be filled out to let a nurse know. Please call spiritual care department if patient needs assistance completing advanced directive.  Gar Ponto, Chaplain  11:03 AM

## 2014-04-21 NOTE — Progress Notes (Signed)
I met with pt at bedside. Pt is min guard assist at this time. [employer] will not approve an inpt rehab admission at this level . I discussed with pt that I do not recommend HH at this time but do feel SNF rehab most appropriate. He is in agreement. I will update RN CM and SW. [phone] 

## 2014-04-21 NOTE — Progress Notes (Signed)
ANTICOAGULATION CONSULT NOTE - Follow Up Consult  Pharmacy Consult for Heparin and Coumadin Indication: atrial fibrillation  Allergies  Allergen Reactions  . Codeine     Headache     Patient Measurements: Height: 5' 7"  (170.2 cm) Weight: 282 lb 6.6 oz (128.1 kg) IBW/kg (Calculated) : 66.1 Heparin Dosing Weight:   Vital Signs: Temp: 98.8 F (37.1 C) (04/18 1253) Temp Source: Oral (04/18 1253) BP: 110/52 mmHg (04/18 1253) Pulse Rate: 80 (04/18 1253)  Labs:  Recent Labs  04/19/14 0450  04/19/14 0540 04/20/14 0525 04/21/14 0354  HGB  --   < > 8.3* 8.1* 8.4*  HCT  --   --  29.6* 28.8* 29.9*  PLT  --   --  140* 121* 122*  LABPROT 17.8*  --   --  17.6* 17.6*  INR 1.46  --   --  1.43 1.43  HEPARINUNFRC 0.43  --   --  0.45 0.46  CREATININE 1.84*  --   --  2.03* 1.93*  < > = values in this interval not displayed.  Estimated Creatinine Clearance: 51 mL/min (by C-G formula based on Cr of 1.93).   Medications:  Scheduled:  . antiseptic oral rinse  7 mL Mouth Rinse BID  . clotrimazole   Topical BID  . diltiazem  180 mg Oral Daily  . famotidine  20 mg Oral BID  . insulin aspart  0-5 Units Subcutaneous QHS  . insulin aspart  0-9 Units Subcutaneous TID WC  . metoprolol tartrate  25 mg Oral Q8H  . warfarin  10 mg Oral ONCE-1800  . Warfarin - Pharmacist Dosing Inpatient   Does not apply q1800    Assessment: 64yo male with AFib, on heparin bridge to Coumadin.  INR 1.43 this AM and heparin level therapeutic on 1900 units/hr.  Hg and pltc are both stable with no bleeding.  Goal of Therapy:  INR 2-3 Monitor platelets by anticoagulation protocol: Yes   Plan:  Continue heparin at current rate Coumadin 58m today Daily HL, CBC, INR Watch for s/s of bleeding  KGracy Bruins [facility]

## 2014-04-21 NOTE — Progress Notes (Signed)
PROGRESS NOTE  William Flores NLZ:[id number] DOB: [birth year] DOA: 04/13/2014 PCP: Redge Gainer, MD  HPI/Recap of past 76 hours: 64 year old white male with past medical history of atrial fibrillation on chronic Coumadin, diabetes mellitus and hypertension admitted on 4/10 for shortness of breath and found to be in acute renal failure, cardiogenic shock and severe hypokalemia. Patient transferred to ICU and intubated on 4/11 with nephrology and cardiology consulted, started on CVVHD. 2-D echo noted severe dilatation of right atrium and ventricle, however ejection fraction preserved which was actually an improvement. Patient was able to be weaned off of pressors in the next few days and extubated on 4/13. His renal function continued to improve and by 4/14, hemodialysis was discontinued. Overall mentation was stable, although was noted to have some swallowing difficulties and put on a dysphagia 1 with nectar thick liquid diet by speech therapy.Patient was transferred to the hospitalist service on 4/16 and moved from stepdown unit to the floor that evening.  Since transfer to the floor, patient doing well with no complaints. Denies any shortness of breath or chest pain. Creatinine slowly continues to improve. INR not yet therapeutic.  Assessment/Plan: Principal Problem:   Acute respiratory failure with hypoxemia secondary to pulmonary edema from Cardiogenic shock with secondary hypotension: Etiology unclear. Has resolved. Has diuresed approximately 3-4 L now.  Chronic systolic heart failure: Resolved. Echocardiogram I she shows improvement of ejection fraction. Is down 3-4 liters. Weight has started to trend back up. We'll restart Lasix and see how much more he puts out.  Active Problems:    Atrial fibrillation with slow ventricular response on chronic anticoagulation. On heparin with Coumadin restarted, INR not yet therapeutic. Monitoring platelet counts carefully as they've been trending down, but  he is not thrombocytopenic.digoxin on hold for now.    Bradycardia:secondary metabolic acidosis.heart rate has been trending up and Cardizem restarted on 4/17 evening. If no issues, further titrate up Cardizem to 360 by mouth daily on 4/19.    Hyperkalemia: Stable    AKI (acute kidney injury)in the setting of stage III chronic kidney disease: Creatinine less than a month ago was 1.35.during this hospitalization peaked as high as 5.6 and has continued to come down, although history and it up over the last 3 days. Continue to monitor.he continues to make good urine.    Metabolic acidosis: Resolved    Hypertension: Stable. Will ask cardiology to follow up on medications to recommend which should be continued on discharge.? Lasix. Stopping ACE inhibitor.    DM (diabetes mellitus), type 2, uncontrolled, with renal complications: T0W 2 months ago at 8.3. Resume home meds on discharge. Following CBGs.    Dysphagia, likely residual from acute encephalopathy due to hypoxemia, heart failure and hypercapnia all of which seem to have resolved: on a restricted diet  Morbid obesity: patient meets criteria with BMI greater than 40 Code Status: full code  Family Communication: I left a message with family  Disposition Plan: Inpatient rehabilitation felt patient was progressing too well. Social worker looking for short-term skilled nursing. Patient can go once INR therapeutic and ensured that he is fully diuresed.  Consultants:  Critical care  Cardiology  Nephrology  Inpatient rehabilitation  Procedures:  Ejection fraction 60-65%, actually improved from 2 months prior. No thrombus. Right ventricular volume and pressure overload with moderate increase of pulmonary pressure  Antibiotics:  none   Objective: BP 110/52 mmHg  Pulse 80  Temp(Src) 98.8 F (37.1 C) (Oral)  Resp 19  Ht 5' 7"  (  1.702 m)  Wt 128.1 kg (282 lb 6.6 oz)  BMI 44.22 kg/m2  SpO2 95%  Intake/Output Summary (Last 24  hours) at 04/21/14 1813 Last data filed at 04/21/14 1748  Gross per 24 hour  Intake    400 ml  Output    325 ml  Net     75 ml   Filed Weights   04/19/14 0200 04/20/14 0515 04/21/14 0342  Weight: 127.3 kg (280 lb 10.3 oz) 126.8 kg (279 lb 8.7 oz) 128.1 kg (282 lb 6.6 oz)    Exam:   General:  Alert and oriented 3, no acute distress  Cardiovascular: irregular rhythm, rate controlled  Respiratory: decreased breath sounds throughout secondary to body habitus. No crackles or wheezes  Abdomen: soft, obese, nontender, normoactive bowel sounds  Musculoskeletal: no clubbing or cyanosis, trace edema   Data Reviewed: Basic Metabolic Panel:  Recent Labs Lab 04/17/14 0430 04/17/14 1545 04/18/14 0430 04/19/14 0450 04/20/14 0525 04/21/14 0354  NA 137  137 136 138 139 140 142  K 4.0  4.0 3.9 4.0 3.5 3.6 3.8  CL 99  99 103 102 99 100 102  CO2 30  30 28 30 31  32 31  GLUCOSE 119*  119* 196* 105* 112* 112* 123*  BUN 27*  27* 25* 30* 37* 41* 43*  CREATININE 1.43*  1.40* 1.42* 1.64* 1.84* 2.03* 1.93*  CALCIUM 8.3*  8.2* 7.9* 8.4 8.4 8.7 9.1  MG 2.4  --  2.6* 2.2 2.3 2.2  PHOS 3.8 3.4 3.6 3.6 3.9 4.2   Liver Function Tests:  Recent Labs Lab 04/17/14 1545 04/18/14 0430 04/19/14 0450 04/20/14 0525 04/21/14 0354  ALBUMIN 2.7* 2.6* 2.7* 2.8* 3.2*   No results for input(s): LIPASE, AMYLASE in the last 168 hours. No results for input(s): AMMONIA in the last 168 hours. CBC:  Recent Labs Lab 04/15/14 0445 04/17/14 0430 04/18/14 0430 04/19/14 0540 04/20/14 0525 04/21/14 0354  WBC 6.8 4.5 4.8 4.3 3.6* 4.3  NEUTROABS 6.0  --   --   --   --   --   HGB 8.8* 8.2* 8.4* 8.3* 8.1* 8.4*  HCT 30.8* 30.4* 31.0* 29.6* 28.8* 29.9*  MCV 87.7 92.7 90.6 88.4 88.3 89.3  PLT 244 192 169 140* 121* 122*   Cardiac Enzymes:   No results for input(s): CKTOTAL, CKMB, CKMBINDEX, TROPONINI in the last 168 hours. BNP (last 3 results)  Recent Labs  02/15/14 0350 04/13/14 2140  04/14/14 0222  BNP 75.9 467.9* 333.2*    ProBNP (last 3 results)  Recent Labs  08/05/13 1403 10/28/13 1746  PROBNP 376.6* 325.7*    CBG:  Recent Labs Lab 04/20/14 1613 04/20/14 2052 04/21/14 0556 04/21/14 1144 04/21/14 1620  GLUCAP 132* 147* 113* 133* 136*    Recent Results (from the past 240 hour(s))  Blood culture (routine x 2)     Status: None   Collection Time: 04/13/14 10:02 PM  Result Value Ref Range Status   Specimen Description BLOOD LEFT HAND  Final   Special Requests BOTTLES DRAWN AEROBIC AND ANAEROBIC 5CC EA  Final   Culture   Final    NO GROWTH 5 DAYS Performed at [facility]    Report Status 04/20/2014 FINAL  Final  Blood culture (routine x 2)     Status: None   Collection Time: 04/13/14 10:08 PM  Result Value Ref Range Status   Specimen Description BLOOD LEFT WRIST  Final   Special Requests BOTTLES DRAWN AEROBIC AND ANAEROBIC 5CC EA  Final   Culture   Final    NO GROWTH 5 DAYS Performed at [facility]    Report Status 04/20/2014 FINAL  Final  MRSA PCR Screening     Status: None   Collection Time: 04/14/14  2:42 AM  Result Value Ref Range Status   MRSA by PCR NEGATIVE NEGATIVE Final    Comment:        The GeneXpert MRSA Assay (FDA approved for NASAL specimens only), is one component of a comprehensive MRSA colonization surveillance program. It is not intended to diagnose MRSA infection nor to guide or monitor treatment for MRSA infections.      Studies: No results found.  Scheduled Meds: . antiseptic oral rinse  7 mL Mouth Rinse BID  . clotrimazole   Topical BID  . diltiazem  180 mg Oral Daily  . famotidine  20 mg Oral BID  . insulin aspart  0-5 Units Subcutaneous QHS  . insulin aspart  0-9 Units Subcutaneous TID WC  . metoprolol tartrate  25 mg Oral Q8H  . Warfarin - Pharmacist Dosing Inpatient   Does not apply q1800    Continuous Infusions: . heparin 1,900 Units/hr (04/21/14 1540)     Time spent: 15  minutes  [place] [facility] Pager [phone]. If 7PM-7AM, please contact night-coverage at [email or web address], password [email or web address] 04/21/2014, 6:13 PM  LOS: 7 days

## 2014-04-21 NOTE — Progress Notes (Signed)
Physical Therapy Treatment Patient Details Name: William Flores MRN: [id number] DOB: Jul 16, 1950 Today's Date: 04/21/2014    History of Present Illness 64 y/o male came to the [facility] ED on 4/10 by EMS for "low oxygen and trouble breathing". He was found to be in cardiogenic shock, bradycardic, in acute renal failure, hyperkalemic, and with lactic acidosis. Recent admission2/10 with respiratory failure. PMH of Afib, sleep apnea, heart failure. Intubated  4/10-4/13 with CVVHD stopped 4/14    PT Comments    Pt has standard walker at home and continues to require education re: safe use of rolling walker vs rollator (rollator likely more appropriate for this patient with respiratory deficits). Muscular and cardiopulmonary fatigue continue to be an issue with SaO2 decr to 89% on 3L with activity. Pt required up to min assist to maintain balance during balance exercises/activities.   Follow Up Recommendations  CIR     Equipment Recommendations  Other (comment) (rollator )    Recommendations for Other Services       Precautions / Restrictions Precautions Precautions: Fall Restrictions Weight Bearing Restrictions: No    Mobility  Bed Mobility Overal bed mobility: Needs Assistance Bed Mobility: Rolling;Sidelying to Sit Rolling: Supervision Sidelying to sit: Min assist       General bed mobility comments: vc for technique (rolling, side to sit) to decr energy expenditure; HOB 0, no rail and required min  assist  Transfers Overall transfer level: Needs assistance Equipment used: Rolling walker (2 wheeled) Transfers: Sit to/from Stand Sit to Stand: Min guard         General transfer comment: x 3 with vc each time for safe use of RW (placing feet inside back legs of RW, hand placement)  Ambulation/Gait Ambulation/Gait assistance: Min guard Ambulation Distance (Feet): 50 Feet (x 3 with standing rest breaks for dyspnea) Assistive device: Rolling walker (2 wheeled) Gait  Pattern/deviations: Step-through pattern;Decreased stride length;Trunk flexed;Wide base of support   Gait velocity interpretation: Below normal speed for age/gender General Gait Details: cues for posture, position in RW, limiting talking to maintain respiratory support   Stairs            Wheelchair Mobility    Modified Rankin (Stroke Patients Only)       Balance Overall balance assessment: Needs assistance         Standing balance support: No upper extremity supported Standing balance-Leahy Scale: Fair               High level balance activites: Backward walking;Side stepping;Direction changes (reaching >6"; standing exercises) High Level Balance Comments: slow and steady with RW for backwards and side stepping; requires min assist for balance with standing exercises and no UE support    Cognition Arousal/Alertness: Awake/alert Behavior During Therapy: WFL for tasks assessed/performed Overall Cognitive Status: Within Functional Limits for tasks assessed                      Exercises General Exercises - Lower Extremity Toe Raises: AAROM;Both;10 reps;Standing Heel Raises: AAROM;Both;10 reps;Standing Mini-Sqauts: AAROM;Both;10 reps;Standing    General Comments        Pertinent Vitals/Pain See above assessment  Pain Assessment: No/denies pain    Home Living                      Prior Function            PT Goals (current goals can now be found in the care plan section) Acute Rehab PT Goals  Time For Goal Achievement: 05/02/14 Progress towards PT goals: Progressing toward goals    Frequency  Min 3X/week    PT Plan Current plan remains appropriate (pt lives alone and needs to achieve modified independent )    Co-evaluation             End of Session Equipment Utilized During Treatment: Oxygen Activity Tolerance: Patient limited by fatigue Patient left: in chair;with call bell/phone within reach     Time:  0946-1020 PT Time Calculation (min) (ACUTE ONLY): 34 min  Charges:  $Gait Training: 8-22 mins $Therapeutic Exercise: 8-22 mins                    G Codes:      Kenyette Gundy 2014/05/03, 10:34 AM Pager [phone]

## 2014-04-21 NOTE — Clinical Social Work Note (Addendum)
2:15pm [employer] auth received  11:30am CSW informed that pt will need SNF at [place] has been started by [facility].  Auth pending.  CSW will continue to follow.  Domenica Reamer, [place] Social Worker [phone]

## 2014-04-21 NOTE — Progress Notes (Signed)
Speech Language Pathology Treatment: Dysphagia  Patient Details Name: William Flores MRN: [id number] DOB: 01-Jun-1950 Today's Date: 04/21/2014 Time: 2263-3354 SLP Time Calculation (min) (ACUTE ONLY): 9 min  Assessment / Plan / Recommendation Clinical Impression  Pt demonstrates improved vocal quality though he is visibly short of breath with minimal movement. Pt able to take single cup sips of water without signs of aspiration, though larger consecutive straw sips still elicit immediate throat clearing. Pt able to return demonstrate small sips with minimal instruction. Recommend upgrade to regular texture diet with thin liquids, SLP will f/u once for tolerance.    HPI HPI: 64 y/o male came to the [facility] ED on 4/10 by EMS for "low oxygen and trouble breathing". He was found to be in cardiogenic shock, bradycardic, in acute renal failure, hyperkalemic, and with lactic acidosis. Pt was intubated 4/10-4/13.   Pertinent Vitals    SLP Plan  Continue with current plan of care    Recommendations Diet recommendations: Regular;Thin liquid Liquids provided via: Cup;No straw Medication Administration: Whole meds with puree Supervision: Staff to assist with self feeding;Full supervision/cueing for compensatory strategies Compensations: Slow rate;Small sips/bites;Follow solids with liquid Postural Changes and/or Swallow Maneuvers: Seated upright 90 degrees;Upright 30-60 min after meal              Oral Care Recommendations: Oral care BID Plan: Continue with current plan of care    GO    [facility], MA CCC-SLP [phone]  William Flores 04/21/2014, 9:04 AM

## 2014-04-22 LAB — PROTIME-INR
INR: 1.38 (ref 0.00–1.49)
PROTHROMBIN TIME: 17.1 s — AB (ref 11.6–15.2)

## 2014-04-22 LAB — RENAL FUNCTION PANEL
ALBUMIN: 3.2 g/dL — AB (ref 3.5–5.2)
ANION GAP: 8 (ref 5–15)
BUN: 46 mg/dL — AB (ref 6–23)
CO2: 31 mmol/L (ref 19–32)
Calcium: 9 mg/dL (ref 8.4–10.5)
Chloride: 103 mmol/L (ref 96–112)
Creatinine, Ser: 1.78 mg/dL — ABNORMAL HIGH (ref 0.50–1.35)
GFR calc Af Amer: 45 mL/min — ABNORMAL LOW (ref >90)
GFR calc non Af Amer: 39 mL/min — ABNORMAL LOW (ref >90)
Glucose, Bld: 121 mg/dL — ABNORMAL HIGH (ref 70–99)
PHOSPHORUS: 4.6 mg/dL (ref 2.3–4.6)
Potassium: 3.7 mmol/L (ref 3.5–5.1)
SODIUM: 142 mmol/L (ref 135–145)

## 2014-04-22 LAB — CBC
HEMATOCRIT: 28.1 % — AB (ref 39.0–52.0)
Hemoglobin: 7.9 g/dL — ABNORMAL LOW (ref 13.0–17.0)
MCH: 25.2 pg — AB (ref 26.0–34.0)
MCHC: 28.1 g/dL — AB (ref 30.0–36.0)
MCV: 89.5 fL (ref 78.0–100.0)
Platelets: 112 10*3/uL — ABNORMAL LOW (ref 150–400)
RBC: 3.14 MIL/uL — ABNORMAL LOW (ref 4.22–5.81)
RDW: 22.2 % — ABNORMAL HIGH (ref 11.5–15.5)
WBC: 5 10*3/uL (ref 4.0–10.5)

## 2014-04-22 LAB — GLUCOSE, CAPILLARY
GLUCOSE-CAPILLARY: 112 mg/dL — AB (ref 70–99)
Glucose-Capillary: 164 mg/dL — ABNORMAL HIGH (ref 70–99)
Glucose-Capillary: 108 mg/dL — ABNORMAL HIGH (ref 70–99)
Glucose-Capillary: 160 mg/dL — ABNORMAL HIGH (ref 70–99)

## 2014-04-22 LAB — HEPARIN LEVEL (UNFRACTIONATED): Heparin Unfractionated: 0.48 IU/mL (ref 0.30–0.70)

## 2014-04-22 LAB — MAGNESIUM: MAGNESIUM: 2.2 mg/dL (ref 1.5–2.5)

## 2014-04-22 MED ORDER — POLYVINYL ALCOHOL 1.4 % OP SOLN
1.0000 [drp] | OPHTHALMIC | Status: DC | PRN
  Administered 2014-04-22: 1 [drp] via OPHTHALMIC
  Filled 2014-04-22: qty 15

## 2014-04-22 MED ORDER — DILTIAZEM HCL ER COATED BEADS 300 MG PO CP24
300.0000 mg | ORAL_CAPSULE | Freq: Every day | ORAL | Status: DC
  Administered 2014-04-23: 300 mg via ORAL
  Filled 2014-04-22: qty 1

## 2014-04-22 MED ORDER — WARFARIN SODIUM 2.5 MG PO TABS
12.5000 mg | ORAL_TABLET | Freq: Once | ORAL | Status: AC
  Administered 2014-04-22: 12.5 mg via ORAL
  Filled 2014-04-22: qty 1

## 2014-04-22 NOTE — Progress Notes (Signed)
[facility]                                                                              Patient Demographics  Tyris Eliot, is a 64 y.o. male, DOB - 09/03/1950, OFB:[id number]  Admit date - 04/13/2014   Admitting Physician Juanito Doom, MD  Outpatient Primary MD for the patient is Redge Gainer, MD  LOS - 8   Chief Complaint  Patient presents with  . Shortness of Breath  . Bradycardia       Brief HPI    Patient is a 64 year old male with atrial fibrillation on chronic Coumadin, diabetes, hypertension was admitted on 4/10 for shortness of breath. Patient was found to be in acute renal failure, cardiogenic shock and severe hypokalemia. Patient was transferred to ICU and intubated on 4/11 with nephrology and cardiology consultations. Patient was started on CVVHD.  2-D echo showed severe elevation of right atrium and ventricle however EF preserved. Patient was able to be weaned off of pressors and extubated on 4/13. Hemodialysis was discontinued, renal function continued to improve. Initially patient was noted to have some swallowing difficulties and was put on dysphagia 1 diet. Patient was transferred to hospitalist service on 4/16 and transferred to stepdown unit. Patient is continuing to improve.      Assessment & Plan    Principal Problem:   Cardiogenic shock, acute respiratory failure with hypoxemia, pulmonary edema, secondary hypotension - Currently resolved, cardiology following  Active Problems: Acute on chronic systolic CHF - 2-D echo showed improvement in EF, continue strict I's and O's and daily weights - Continue IV diuresis, negative balance of 3.4 L    Acute-on-chronic renal failure - In the setting STAGE III chronic kidney disease, creatinine trended up to 5.6 and continues to improve, 1.7 today     Atrial fibrillation with slow ventricular response,  Bradycardia - Continue Cardizem, increase to 300 mg daily - Discussed with cardiology,  Dr. Aundra Dubin, recommended to continue Coumadin for now as his renal function has been up and down. If creatinine continues to remain stable will transition to [place] outpatient. Does not need heparin bridging if he is ready for discharge.   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 antiseptic oral rinse  7 mL Mouth Rinse BID  . clotrimazole   Topical BID  . diltiazem  180 mg Oral Daily  . famotidine  20 mg Oral BID  . furosemide  20 mg Intravenous Q12H  . insulin aspart  0-5 Units Subcutaneous QHS  . insulin aspart  0-9 Units Subcutaneous TID WC  .  metoprolol tartrate  25 mg Oral Q8H  . Warfarin - Pharmacist Dosing Inpatient   Does not apply q1800   Continuous Infusions: . heparin 1,900 Units/hr (04/22/14 0430)   PRN Meds:.benzonatate, fentaNYL (SUBLIMAZE) injection, polyvinyl alcohol, RESOURCE THICKENUP CLEAR   Antibiotics   Anti-infectives    Start     Dose/Rate Route Frequency Ordered Stop   04/15/14 0600  vancomycin (VANCOCIN) 1,250 mg in sodium chloride 0.9 % 250 mL IVPB  Status:  Discontinued     1,250 mg 166.7 mL/hr over 90 Minutes Intravenous Every 24 hours 04/14/14 1326 04/16/14 0921   04/14/14 1800  ceFEPIme (MAXIPIME) 2 g in dextrose 5 % 50 mL IVPB  Status:  Discontinued     2 g 100 mL/hr over 30 Minutes Intravenous Every 12 hours 04/14/14 1326 04/17/14 1325   04/14/14 0115  vancomycin (VANCOCIN) 2,500 mg in sodium  chloride 0.9 % 500 mL IVPB     2,500 mg 250 mL/hr over 120 Minutes Intravenous  Once 04/14/14 0113 04/14/14 0540   04/14/14 0115  ceFEPIme (MAXIPIME) 2 g in dextrose 5 % 50 mL IVPB     2 g 100 mL/hr over 30 Minutes Intravenous  Once 04/14/14 0113 04/14/14 5329        Subjective:   Keivon Garden was seen and examined today. Patient denies dizziness, chest pain, shortness of breath, abdominal pain, N/V/D/C, new weakness, numbess, tingling. No acute events overnight.   still pitting edema overall improving  Objective:   Blood pressure 120/72, pulse 90, temperature 97.7 F (36.5 C), temperature source Oral, resp. rate 18, height 5' 7"  (1.702 m), weight 128.5 kg (283 lb 4.7 oz), SpO2 95 %.  Wt Readings from Last 3 Encounters:  04/22/14 128.5 kg (283 lb 4.7 oz)  03/20/14 121.564 kg (268 lb)  03/19/14 120.657 kg (266 lb)     Intake/Output Summary (Last 24 hours) at 04/22/14 1209 Last data filed at 04/22/14 0900  Gross per 24 hour  Intake    300 ml  Output    302 ml  Net     -2 ml    Exam  General: Alert and oriented x 3, NAD  HEENT:  PERRLA, EOMI, Anicteic Sclera, mucous membranes moist.   Neck: Supple, no JVD, no masses  CVS: Irregularly irregular  Respiratory: Clear to auscultation bilaterally, no wheezing, rales or rhonchi  Abdomen: Soft, nontender, nondistended, + bowel sounds  Ext: no cyanosis clubbing, 2+ edema  Neuro: AAOx3, Cr N's II- XII. Strength 5/5 upper and lower extremities bilaterally  Skin: No rashes  Psych: Normal affect and demeanor, alert and oriented x3    Data Review   Micro Results Recent Results (from the past 240 hour(s))  Blood culture (routine x 2)     Status: None   Collection Time: 04/13/14 10:02 PM  Result Value Ref Range Status   Specimen Description BLOOD LEFT HAND  Final   Special Requests BOTTLES DRAWN AEROBIC AND ANAEROBIC 5CC EA  Final   Culture   Final    NO GROWTH 5 DAYS Performed at [facility]    Report  Status 04/20/2014 FINAL  Final  Blood culture (routine x 2)     Status: None   Collection Time: 04/13/14 10:08 PM  Result Value Ref Range Status   Specimen Description BLOOD LEFT WRIST  Final   Special Requests BOTTLES DRAWN AEROBIC AND ANAEROBIC 5CC EA  Final   Culture   Final    NO GROWTH 5 DAYS Performed  at [facility]    Report Status 04/20/2014 FINAL  Final  MRSA PCR Screening     Status: None   Collection Time: 04/14/14  2:42 AM  Result Value Ref Range Status   MRSA by PCR NEGATIVE NEGATIVE Final    Comment:        The GeneXpert MRSA Assay (FDA approved for NASAL specimens only), is one component of a comprehensive MRSA colonization surveillance program. It is not intended to diagnose MRSA infection nor to guide or monitor treatment for MRSA infections.     Radiology Reports Dg Chest Port 1 View  04/17/2014   CLINICAL DATA:  Acute respiratory failure. Hypoxia. Follow-up exam.  EXAM: PORTABLE CHEST - 1 VIEW  COMPARISON:  04/16/2014.  FINDINGS: Right greater than left lung interstitial airspace opacities are mildly improved from the previous day's study. No new lung opacities.  Right internal jugular dual-lumen central venous line and left internal jugular single-lumen central venous line are stable.  Endotracheal tube and nasogastric tube have been removed.  No pneumothorax.  IMPRESSION: 1. Mild improvement interstitial airspace opacities when compared to the previous day's study. 2. Status post removal of the endotracheal tube and nasogastric tube. 3. No other changes.   Electronically Signed   By: Lajean Manes M.D.   On: 04/17/2014 07:48   Dg Chest Port 1 View  04/16/2014   CLINICAL DATA:  Respiratory failure.  EXAM: PORTABLE CHEST - 1 VIEW  COMPARISON:  04/15/2014  FINDINGS: Endotracheal tube tip is approximately 3.5 cm above the carina. Right jugular large-bore central venous catheter is stable in positioning in the upper SVC. Left jugular catheter tip is also in  the SVC. Lungs show some improvement in aeration on the right with residual interstitial edema remaining as well as lower lung atelectasis. The heart size is stable. Less prominent appearance of right pleural fluid with probable component of bilateral pleural fluid remaining.  IMPRESSION: Improved aeration of the right lung. Residual interstitial edema with less prominent appearance of right pleural effusion.   Electronically Signed   By: Aletta Edouard M.D.   On: 04/16/2014 08:12   Dg Chest Port 1 View  04/15/2014   CLINICAL DATA:  Acute respiratory failure with hypoxia.  EXAM: PORTABLE CHEST - 1 VIEW  COMPARISON:  April 14, 2014.  FINDINGS: Stable cardiomegaly. No pneumothorax is noted. Increased bilateral pleural effusions are noted with right greater than left. Central pulmonary vascular congestion and perihilar edema is noted. Nasogastric tube is seen passing port stomach. Endotracheal tube is in grossly good position with distal tip 4 cm above the carina. Right internal jugular catheter is noted with distal tip overlying expected position of right innominate vein. Left internal jugular catheter is noted with distal tip in the expected position of the SVC.  IMPRESSION: Stable support apparatus. Continued presence of central pulmonary vascular congestion and bilateral pulmonary edema. Significantly increased bilateral pleural effusions are noted with right greater than left.   Electronically Signed   By: Marijo Conception, M.D.   On: 04/15/2014 07:35   Dg Chest Port 1 View  04/14/2014   CLINICAL DATA:  Central line placement.  EXAM: PORTABLE CHEST - 1 VIEW  COMPARISON:  Chest x-ray from the same day at 00:38  FINDINGS: New right IJ central line, tip near the right IJ/ brachiocephalic confluence. A left IJ central line is in similar position, tip at the upper SVC. The endotracheal tube and orogastric tube remains in good position.  Unchanged cardiomegaly and  upper mediastinal widening. There is diffuse  interstitial and airspace opacity consistent with pulmonary edema. Layering pleural effusions are likely. No pneumothorax.  IMPRESSION: 1. New right neck central line, tip near the right IJ/ brachiocephalic vein confluence. No pneumothorax. 2. Pre-existing hardware is in unchanged position. 3. Unchanged pulmonary edema.   Electronically Signed   By: Monte Fantasia M.D.   On: 04/14/2014 05:32   Dg Chest Portable 1 View  04/14/2014   CLINICAL DATA:  Endotracheal tube placement.  EXAM: PORTABLE CHEST - 1 VIEW  COMPARISON:  Earlier this day at 0008 hr  FINDINGS: Endotracheal tube 3.3 cm from the carina. Enteric tube in place, mid and distal portions not well seen, tip obscure due to multiple overlying artifacts. Tip of the left central line in the SVC. Cardiomegaly is grossly stable. Bilateral perihilar opacities, minimally improved in the right lung. Bilateral pleural effusions are unchanged.  IMPRESSION: 1. Endotracheal tube 3.3 cm from the carina. 2. Enteric tube in place, however not well visualized beyond the mid distal esophagus due to overlying monitoring devices.   Electronically Signed   By: Jeb Levering M.D.   On: 04/14/2014 01:56   Dg Chest Portable 1 View  04/14/2014   CLINICAL DATA:  Shortness of breath.  Central line placement  EXAM: PORTABLE CHEST - 1 VIEW  COMPARISON:  04/13/2014  FINDINGS: New left IJ catheter, tip at the upper SVC. No indication of pneumothorax.  Progressive bilateral lung opacification. There are layering pleural effusions. Stable cardiopericardial enlargement and vascular pedicle widening.  IMPRESSION: 1. New left IJ catheter is in good position. No visible pneumothorax. 2. Progressive CHF.   Electronically Signed   By: Monte Fantasia M.D.   On: 04/14/2014 00:58   Dg Chest Portable 1 View  04/13/2014   CLINICAL DATA:  Shortness of breath and bradycardia  EXAM: PORTABLE CHEST - 1 VIEW  COMPARISON:  02/20/2014  FINDINGS: Chronic cardiomegaly, with size accentuated by  increase in lower mediastinal fat on recent CT. There is widening of the vascular pedicle which is also also stable.  Haziness of the lower chest compatible with pleural effusions and atelectasis. There is pulmonary venous congestion and probable basilar alveolar edema.  IMPRESSION: CHF pattern.   Electronically Signed   By: Monte Fantasia M.D.   On: 04/13/2014 22:15   Dg Abd Portable 1v  04/14/2014   CLINICAL DATA:  Metabolic acidosis.  EXAM: PORTABLE ABDOMEN - 1 VIEW  COMPARISON:  02/19/2014  FINDINGS: There is a paucity of gas throughout the abdomen. No convincing evidence for bowel obstruction. NG tube is in the stomach. No free air on this portable supine study. No visible organomegaly or suspicious calcification.  IMPRESSION: Nonspecific bowel gas pattern with relative paucity of gas. No evidence of obstruction.   Electronically Signed   By: Rolm Baptise M.D.   On: 04/14/2014 09:19    CBC  Recent Labs Lab 04/18/14 0430 04/19/14 0540 04/20/14 0525 04/21/14 0354 04/22/14 0525  WBC 4.8 4.3 3.6* 4.3 5.0  HGB 8.4* 8.3* 8.1* 8.4* 7.9*  HCT 31.0* 29.6* 28.8* 29.9* 28.1*  PLT 169 140* 121* 122* 112*  MCV 90.6 88.4 88.3 89.3 89.5  MCH 24.6* 24.8* 24.8* 25.1* 25.2*  MCHC 27.1* 28.0* 28.1* 28.1* 28.1*  RDW 22.9* 22.4* 22.2* 22.3* 22.2*    Chemistries   Recent Labs Lab 04/18/14 0430 04/19/14 0450 04/20/14 0525 04/21/14 0354 04/22/14 0525  NA 138 139 140 142 142  K 4.0 3.5 3.6 3.8 3.7  CL  102 99 100 102 103  CO2 30 31 32 31 31  GLUCOSE 105* 112* 112* 123* 121*  BUN 30* 37* 41* 43* 46*  CREATININE 1.64* 1.84* 2.03* 1.93* 1.78*  CALCIUM 8.4 8.4 8.7 9.1 9.0  MG 2.6* 2.2 2.3 2.2 2.2   ------------------------------------------------------------------------------------------------------------------ estimated creatinine clearance is 55.4 mL/min (by C-G formula based on Cr of  1.78). ------------------------------------------------------------------------------------------------------------------ No results for input(s): HGBA1C in the last 72 hours. ------------------------------------------------------------------------------------------------------------------ No results for input(s): CHOL, HDL, LDLCALC, TRIG, CHOLHDL, LDLDIRECT in the last 72 hours. ------------------------------------------------------------------------------------------------------------------ No results for input(s): TSH, T4TOTAL, T3FREE, THYROIDAB in the last 72 hours.  Invalid input(s): FREET3 ------------------------------------------------------------------------------------------------------------------ No results for input(s): VITAMINB12, FOLATE, FERRITIN, TIBC, IRON, RETICCTPCT in the last 72 hours.  Coagulation profile  Recent Labs Lab 04/18/14 2355 04/19/14 0450 04/20/14 0525 04/21/14 0354 04/22/14 0525  INR 1.41 1.46 1.43 1.43 1.38    No results for input(s): DDIMER in the last 72 hours.  Cardiac Enzymes No results for input(s): CKMB, TROPONINI, MYOGLOBIN in the last 168 hours.  Invalid input(s): CK ------------------------------------------------------------------------------------------------------------------ Invalid input(s): POCBNP   Recent Labs  04/21/14 0556 04/21/14 1144 04/21/14 1620 04/21/14 2123 04/22/14 0613 04/22/14 1120  GLUCAP 113* 133* 136* 121* 112* 164*     Charlisha Market M.D. [facility] 04/22/2014, 12:09 PM  Pager: [phone]   Between 7am to 7pm - call Pager - [phone]  After 7pm go to [email or web address] - password [email or web address]  Call night coverage person covering after 7pm

## 2014-04-22 NOTE — Progress Notes (Signed)
Utilization review completed.  

## 2014-04-22 NOTE — Progress Notes (Signed)
ANTICOAGULATION CONSULT NOTE - Follow Up Consult  Pharmacy Consult for Heparin and Coumadin Indication: atrial fibrillation  Allergies  Allergen Reactions  . Codeine     Headache     Patient Measurements: Height: 5' 7"  (170.2 cm) Weight: 283 lb 4.7 oz (128.5 kg) IBW/kg (Calculated) : 66.1   Vital Signs: Temp: 98.3 F (36.8 C) (04/19 1335) Temp Source: Oral (04/19 1335) BP: 100/55 mmHg (04/19 1335) Pulse Rate: 80 (04/19 1335)  Labs:  Recent Labs  04/20/14 0525 04/21/14 0354 04/22/14 0525  HGB 8.1* 8.4* 7.9*  HCT 28.8* 29.9* 28.1*  PLT 121* 122* 112*  LABPROT 17.6* 17.6* 17.1*  INR 1.43 1.43 1.38  HEPARINUNFRC 0.45 0.46 0.48  CREATININE 2.03* 1.93* 1.78*    Estimated Creatinine Clearance: 55.4 mL/min (by C-G formula based on Cr of 1.78).   Assessment: 64yo male with AFib, on heparin bridge to Coumadin.  INR 1.38 (down) this AM and heparin level therapeutic on 1900 units/hr(0.4).  Hg and pltc are both stable with no bleeding.  Discussion was had with [facility] and cardiology this am about transitioning to [place], with fluctuating renal function will continue warfarin for now and reconsider as outpatient. Also bridge not necessary if ready for d/c.  This patients CHA2DS2-VASc Score and unadjusted Ischemic Stroke Rate (% per year) is equal to 3.2 % stroke rate/year from a score of 3  Above score calculated as 1 point each if present [CHF, HTN, DM, Vascular=MI/PAD/Aortic Plaque, Age if 65-74, or Male] Above score calculated as 2 points each if present [Age > 75, or Stroke/TIA/TE]      Goal of Therapy:  INR 2-3 Monitor platelets by anticoagulation protocol: Yes   Plan:  Continue heparin at current rate Coumadin 12.49m today Daily HL, CBC, INR Watch for s/s of bleeding  FErin HearingPharmD., BCPS Clinical Pharmacist Pager [phone]/19/2016 2:34 PM

## 2014-04-22 NOTE — Progress Notes (Signed)
Occupational Therapy Treatment Patient Details Name: OSKAR CRETELLA MRN: [id number] DOB: [birth year] Today's Date: 04/22/2014    History of present illness 64 y/o male came to the [facility] ED on 4/10 by EMS for "low oxygen and trouble breathing". He was found to be in cardiogenic shock, bradycardic, in acute renal failure, hyperkalemic, and with lactic acidosis. Recent admission2/10 with respiratory failure. PMH of Afib, sleep apnea, heart failure. Intubated  4/10-4/13 with CVVHD stopped 4/14   OT comments  Pt is progressing steadily in ability to perform LB ADL, toileting and standing grooming.  Balance deficits and decreased activity tolerance continue to be limiting factors in ADL.  CIR has declined admission, pt is agreeable to ST rehab in SNF.  Follow Up Recommendations  SNF    Equipment Recommendations       Recommendations for Other Services      Precautions / Restrictions Precautions Precautions: Fall       Mobility Bed Mobility                  Transfers   Equipment used: Rolling walker (2 wheeled)   Sit to Stand: Min guard         General transfer comment: from recliner and toilet    Balance                                   ADL Overall ADL's : Needs assistance/impaired     Grooming: Wash/dry hands;Standing;Min guard               Lower Body Dressing: Min guard;Sit to/from stand Lower Body Dressing Details (indicate cue type and reason): pt demonstrated ability to cross R foot over L knee to donn and doff sock, brought L LE up onto bed to donn and doff sock Toilet Transfer: Min guard;Ambulation;RW;Regular Museum/gallery exhibitions officer and Hygiene: Min guard;Sit to/from stand         General ADL Comments: Educated in energy conservation and benefits of rollator.      Vision                     Perception     Praxis      Cognition   Behavior During Therapy: WFL for tasks assessed/performed Overall  Cognitive Status: Within Functional Limits for tasks assessed                       Extremity/Trunk Assessment               Exercises     Shoulder Instructions       General Comments      Pertinent Vitals/ Pain       Pain Assessment: No/denies pain  Home Living                                          Prior Functioning/Environment              Frequency Min 2X/week     Progress Toward Goals  OT Goals(current goals can now be found in the care plan section)  Progress towards OT goals: Progressing toward goals  Acute Rehab OT Goals Patient Stated Goal: go hunting and fishing Time For Goal Achievement: 04/27/14 Potential to Achieve Goals: Good  Plan Discharge plan  needs to be updated    Co-evaluation                 End of Session Equipment Utilized During Treatment: Oxygen   Activity Tolerance Patient tolerated treatment well   Patient Left with call bell/phone within reach;in chair   Nurse Communication          Time: 1417-1440 OT Time Calculation (min): 23 min  Charges: OT General Charges $OT Visit: 1 Procedure OT Treatments $Self Care/Home Management : 23-37 mins  [name] 04/22/2014, 2:56 PM  [phone]

## 2014-04-23 LAB — RENAL FUNCTION PANEL
ALBUMIN: 3.2 g/dL — AB (ref 3.5–5.2)
ANION GAP: 9 (ref 5–15)
BUN: 47 mg/dL — ABNORMAL HIGH (ref 6–23)
CHLORIDE: 102 mmol/L (ref 96–112)
CO2: 31 mmol/L (ref 19–32)
CREATININE: 1.84 mg/dL — AB (ref 0.50–1.35)
Calcium: 9.2 mg/dL (ref 8.4–10.5)
GFR calc Af Amer: 44 mL/min — ABNORMAL LOW (ref >90)
GFR calc non Af Amer: 38 mL/min — ABNORMAL LOW (ref >90)
Glucose, Bld: 122 mg/dL — ABNORMAL HIGH (ref 70–99)
POTASSIUM: 3.9 mmol/L (ref 3.5–5.1)
Phosphorus: 4.6 mg/dL (ref 2.3–4.6)
SODIUM: 142 mmol/L (ref 135–145)

## 2014-04-23 LAB — CBC
HEMATOCRIT: 27.9 % — AB (ref 39.0–52.0)
Hemoglobin: 7.8 g/dL — ABNORMAL LOW (ref 13.0–17.0)
MCH: 24.9 pg — ABNORMAL LOW (ref 26.0–34.0)
MCHC: 28 g/dL — ABNORMAL LOW (ref 30.0–36.0)
MCV: 89.1 fL (ref 78.0–100.0)
Platelets: 110 10*3/uL — ABNORMAL LOW (ref 150–400)
RBC: 3.13 MIL/uL — AB (ref 4.22–5.81)
RDW: 22.2 % — ABNORMAL HIGH (ref 11.5–15.5)
WBC: 5.4 10*3/uL (ref 4.0–10.5)

## 2014-04-23 LAB — MAGNESIUM: Magnesium: 2.2 mg/dL (ref 1.5–2.5)

## 2014-04-23 LAB — PROTIME-INR
INR: 1.49 (ref 0.00–1.49)
PROTHROMBIN TIME: 18.2 s — AB (ref 11.6–15.2)

## 2014-04-23 LAB — HEPARIN LEVEL (UNFRACTIONATED): Heparin Unfractionated: 0.35 IU/mL (ref 0.30–0.70)

## 2014-04-23 LAB — GLUCOSE, CAPILLARY
Glucose-Capillary: 123 mg/dL — ABNORMAL HIGH (ref 70–99)
Glucose-Capillary: 124 mg/dL — ABNORMAL HIGH (ref 70–99)

## 2014-04-23 MED ORDER — POLYVINYL ALCOHOL 1.4 % OP SOLN: 1.0000 [drp] | OPHTHALMIC | Status: DC | PRN

## 2014-04-23 MED ORDER — METOPROLOL TARTRATE 25 MG PO TABS: 25.0000 mg | ORAL_TABLET | Freq: Three times a day (TID) | ORAL | Status: DC

## 2014-04-23 MED ORDER — BENZONATATE 100 MG PO CAPS: 100.0000 mg | ORAL_CAPSULE | Freq: Two times a day (BID) | ORAL | Status: DC | PRN

## 2014-04-23 MED ORDER — INSULIN ASPART 100 UNIT/ML ~~LOC~~ SOLN: 0.0000 [IU] | Freq: Three times a day (TID) | SUBCUTANEOUS | Status: DC

## 2014-04-23 MED ORDER — WARFARIN SODIUM 2.5 MG PO TABS
12.5000 mg | ORAL_TABLET | Freq: Once | ORAL | Status: DC
  Filled 2014-04-23: qty 1

## 2014-04-23 MED ORDER — DILTIAZEM HCL ER COATED BEADS 300 MG PO CP24: 300.0000 mg | ORAL_CAPSULE | Freq: Every day | ORAL | Status: DC

## 2014-04-23 NOTE — Progress Notes (Addendum)
ANTICOAGULATION CONSULT NOTE - Follow Up Consult  Pharmacy Consult for Heparin and warfarin Indication: atrial fibrillation  Allergies  Allergen Reactions  . Codeine     Headache     Patient Measurements: Height: 5' 7"  (170.2 cm) Weight: 282 lb 13.6 oz (128.3 kg) IBW/kg (Calculated) : 66.1   Vital Signs: Temp: 97.6 F (36.4 C) (04/20 0526) Temp Source: Oral (04/20 0526) BP: 119/68 mmHg (04/20 0526) Pulse Rate: 68 (04/20 0526)  Labs:  Recent Labs  04/21/14 0354 04/22/14 0525 04/23/14 0437  HGB 8.4* 7.9* 7.8*  HCT 29.9* 28.1* 27.9*  PLT 122* 112* 110*  LABPROT 17.6* 17.1* 18.2*  INR 1.43 1.38 1.49  HEPARINUNFRC 0.46 0.48 0.35  CREATININE 1.93* 1.78* 1.84*    Estimated Creatinine Clearance: 53.6 mL/min (by C-G formula based on Cr of 1.84).   Assessment: 64yo male with AFib, on heparin bridge to Coumadin.  INR 1.4 this AM - INR has been slow to increase. Heparin level remains therapeutic (0.35units/mL)on 1900 units/hr.  Hgb and plt are both low, but stable with no bleeding noted.  Yesterday, discussion was had with TR[facility] and cardiology about transitioning to [place]. Decision made to continue warfarin for now with fluctuating renal function, and reconsider [facility]AC as outpatient. Also bridge not necessary if ready for d/c.    This patients CHA2DS2-VASc Score and unadjusted Ischemic Stroke Rate (% per year) is equal to 3.2 % stroke rate/year from a score of 3  Above score calculated as 1 point each if present [CHF, HTN, DM, Vascular=MI/PAD/Aortic Plaque, Age if 65-74, or Male] Above score calculated as 2 points each if present [Age > 75, or Stroke/TIA/TE]    Goal of Therapy:  INR 2-3 Monitor platelets by anticoagulation protocol: Yes   Plan:  -Continue heparin at current rate of 1900 units/hr -Noted patient to discharge to SNF today- recommend warfarin 12.28m today and tomorrow with an INR check on Friday **note: dose for today has been ordered. Can be given to  patient in the afternoon prior to discharge if desired  -Daily HL, CBC, INR while here in hospital -Watch for s/s of bleeding  Dynisha Due D. Sallie Staron, PharmD, BCPS Clinical Pharmacist Pager: [phone] 96284/20/2016 8:26 AM

## 2014-04-23 NOTE — Progress Notes (Signed)
Speech Language Pathology Treatment: Dysphagia  Patient Details Name: William Flores MRN: [id number] DOB: 1950-04-27 Today's Date: 04/23/2014 Time: 7375-0510 SLP Time Calculation (min) (ACUTE ONLY): 8 min  Assessment / Plan / Recommendation Clinical Impression  F/u for swallowing.  Pt with much improved swallow function - dysphagia now resolved.   Tolerating solids and thins with no s/s of aspiration.  Educated pt re: coordination of respiration and swallowing and basic precautions to follow when WOB increases.  Pt with adequate understanding.  No further SLP f/u is warranted at SNF.  D/C SLP.   HPI HPI: 64 y/o male came to the [facility] ED on 4/10 by EMS for "low oxygen and trouble breathing". He was found to be in cardiogenic shock, bradycardic, in acute renal failure, hyperkalemic, and with lactic acidosis. Pt was intubated 4/10-4/13.   Pertinent Vitals Pain Assessment: No/denies pain  SLP Plan  All goals met    Recommendations Diet recommendations: Regular;Thin liquid Liquids provided via: Cup;Straw Medication Administration: Whole meds with puree Supervision: Patient able to self feed              Oral Care Recommendations: Oral care BID Follow up Recommendations: None Plan: All goals met  William Flores L. Tivis Ringer, [place] CCC/SLP Pager [phone]      William Flores 04/23/2014, 9:44 AM

## 2014-04-23 NOTE — Progress Notes (Signed)
  Pharmacy Discharge Medication Therapy Review   Total Number of meds on admission: 16 (polypharmacy > 10 meds)  Indications for all medications: [x]  Yes       []  No  Adherence Review  []  Excellent (no doses missed/week)     [x]  Good (no more than 1 dose missed/week)     []  Partial (2-3 doses missed/week)     []  Poor (>3 doses missed/week)  Total number of high risk medications: 4 (Anticoagulants, Dual antiplatelets, oral Antihyperglycemic agents, Insulins, Antipsychotics, Anti-Seizure meds, Inhalers, HF/ACS meds, Antibiotics and HIV medications)   Assessment: (Medication related problems)  Intervention  YES NO  Explanation   Indications      Medication without noted indication []  [x]     Indication without noted medication []  [x]     Duplicate therapy [x]  []  Duplicate warfarin listed on discharge med rec - updated to reflect one dose.  Efficacy      Suboptimal drug or dose selection []  [x]    Insufficient dose/duration [x]  []  Patient admitted on Toprol 25m BID and to be discharged on Lopressor 235mTID. Contacted [facility] - to be discharged on lower total daily dose per cards initiation last week.  Failure to receive therapy  (Rx not filled) []  [x]     Safety      Adverse drug event []  [x]     Drug-lab interaction [x]  []  AKI has still not resolved, SCr this AM 1.84. Janumet listed on discharge med rec - should be held in males for SCr >1.5.   Excessive dose/duration []  []    High-risk medications [x]  []    Compliance     Underuse []  [x]     Overuse []  [x]    Other pertinent pharmacist counseling [x]  []  Reviewed medication changes with patient - holding Janumet, lisinopril, and metolazone since AKI not fully resolved. Discussed switch from Toprol to Lopressor. Patient states that he uses a pill box to help remember his medications.     Total number of new medications upon discharge: 1   Time:  Time spent preparing for discharge counseling: 15 minutes Time spent counseling patient: 10  minutes  PLAN:  Recommendations discussed with Dr. RaTana Coastnd are as follows: - Discontinue Janumet at discharge given AKI - follow up outpatient to resume once SCr <1<5.9 Deleted duplicate warfarin from discharge summary  - Consider switching Lopressor back to Toprol in outpatient setting for easier adherence  Bintou Lafata E. Gearld Kerstein, Pharm.D Clinical Pharmacy Resident Pager: [phone]/20/2016 10:40 AM

## 2014-04-23 NOTE — Discharge Summary (Signed)
Physician Discharge Summary   Patient ID: William Flores MRN: [id number] DOB/AGE: 05-02-1950 64 y.o.  Admit date: 04/13/2014 Discharge date: 04/23/2014  Primary Care Physician:  William Gainer, MD  Discharge Diagnoses:     . Cardiogenic shock resolved  . Acute-on-chronic renal failure . Atrial fibrillation with slow ventricular response . Bradycardia . Hyperkalemia . AKI (acute kidney injury) . Metabolic acidosis . Hypertension . DM (diabetes mellitus), type 2, uncontrolled, with renal complications  Consults: Cardiology, Dr. Aundra Dubin   Recommendations for Outpatient Follow-up:   Patient needs a follow-up in cardiology/heart failure clinic within next week to 10 days  Please check BMET, INR on Friday 04/25/14  Continue Coumadin 10 mg daily, follow INR on Friday 04/25/14   TESTS THAT NEED FOLLOW-UP BMET, INR on 04/25/58   DIET: Heart healthy diet with fluid restriction 1500 mL/24HRS    Allergies:   Allergies  Allergen Reactions  . Codeine     Headache      Discharge Medications:   Medication List    STOP taking these medications        aspirin 81 MG tablet     digoxin 0.125 MG tablet  Commonly known as:  LANOXIN     lisinopril 5 MG tablet  Commonly known as:  PRINIVIL,ZESTRIL     metolazone 2.5 MG tablet  Commonly known as:  ZAROXOLYN     metoprolol succinate 50 MG 24 hr tablet  Commonly known as:  TOPROL-XL     sitaGLIPtin-metformin 50-1000 MG per tablet  Commonly known as:  JANUMET      TAKE these medications        allopurinol 100 MG tablet  Commonly known as:  ZYLOPRIM  Take 100 mg by mouth daily.     atorvastatin 20 MG tablet  Commonly known as:  LIPITOR  Take 1 tablet (20 mg total) by mouth daily at 6 PM.     azelastine 0.05 % ophthalmic solution  Commonly known as:  OPTIVAR  Place 2 drops into both eyes 2 (two) times daily.     benzonatate 100 MG capsule  Commonly known as:  TESSALON  Take 1 capsule (100 mg total) by mouth 2  (two) times daily as needed for cough.     diltiazem 300 MG 24 hr capsule  Commonly known as:  CARDIZEM CD  Take 1 capsule (300 mg total) by mouth daily.     ferrous sulfate 325 (65 FE) MG tablet  Take 1 tablet (325 mg total) by mouth 2 (two) times daily with a meal.     furosemide 40 MG tablet  Commonly known as:  LASIX  Take 40 mg by mouth daily.     glucose blood test strip  Use to check BG daily     insulin aspart 100 UNIT/ML injection  Commonly known as:  novoLOG  Inject 0-9 Units into the skin 3 (three) times daily with meals. Sliding scale CBG 70 - 120: 0 units CBG 121 - 150: 1 unit,  CBG 151 - 200: 2 units,  CBG 201 - 250: 3 units,  CBG 251 - 300: 5 units,  CBG 301 - 350: 7 units,  CBG 351 - 400: 9 units   CBG > 400: 9 units and notify your MD     KLOR-CON M20 20 MEQ tablet  Generic drug:  potassium chloride SA  Take 10 mEq by mouth daily.     metoprolol tartrate 25 MG tablet  Commonly known as:  LOPRESSOR  Take  1 tablet (25 mg total) by mouth every 8 (eight) hours.     omeprazole 20 MG capsule  Commonly known as:  PRILOSEC  TAKE 1 CAPSULE (20 MG TOTAL) BY MOUTH DAILY.     ONETOUCH DELICA LANCETS 87G Misc  1 each by Does not apply route daily. Use one lancet to check BG daily     polyvinyl alcohol 1.4 % ophthalmic solution  Commonly known as:  LIQUIFILM TEARS  Place 1 drop into both eyes as needed for dry eyes.     warfarin 10 MG tablet  Commonly known as:  COUMADIN  Take 1 tablet (10 mg total) by mouth daily at 6 PM.         Brief H and P: For complete details please refer to admission H and P, but in briefPatient is a 64 year old male with atrial fibrillation on chronic Coumadin, diabetes, hypertension was admitted on 4/10 for shortness of breath. Patient was found to be in acute renal failure, cardiogenic shock and severe hypokalemia and was admitted to ICU.    Hospital Course:   Patient is a 64 year old male with atrial fibrillation, diabetes,  hypertension was admitted on 4/10 for shortness of breath. He was found to be in acute renal failure, pulmonary edema, cardiogenic shock and severe hypo-kalemia. Patient was transferred to ICU and intubated on 4/11 with nephrology and cardiology consultations. Patient was started on CVVHD.  2-D echo showed severe elevation of right atrium and ventricle however EF preserved. Patient was able to be weaned off of pressors and extubated on 4/13. Hemodialysis was discontinued, renal function has continued to improve. Initially patient was noted to have some swallowing difficulties and was put on dysphagia 1 diet. Patient was closely followed by ice speech therapy and has continued to improve, currently tolerating regular diet.   Principal Problem:  Cardiogenic shock, acute respiratory failure with hypoxemia secondary to pulmonary edema from cardiogenic shock with secondary hypotension: Unclear etiology, has resolved. Patient has diuresed approximately 2-3L now.  Acute on chronic systolic CHF with pulmonary edema.  -Currently improving, 2-D echo showed EF of 60-65%, no regional wall motion abnormalities, right ventricle systolic function mildly reduced, mild to moderate tricuspid regurgitation.  Continue diuresis with Lasix, patient to follow closely with [facility] heart failure clinic, may need to be on Zaroxolyn if BP allows.   Acute-on-chronic renal failure - In the setting STAGE III chronic kidney disease, creatinine trended up to 5.6 and continues to improve, 1.8 at the time of discharge.   Atrial fibrillation with slow ventricular response, Bradycardia - Continue Cardizem and beta blocker as per cardiology recommendations. Discussed with cardiology, Dr. Aundra Dubin, recommended to continue Coumadin for now as his renal function has been up and down. If creatinine continues to remain stable will transition to [place] outpatient. Does not need heparin bridging if he is ready for discharge.  INR is 1.49,  continue Coumadin. Follow INR for dose adjustment.  Diabetes mellitus type 2 uncontrolled with renal complications - Currently stable, hemoglobin A1c 8.3, creatinine is still 1.8. Hold Janumet. Placed on sliding scale insulin for now  Dysphagia: Likely due to acute encephalopathy from hypoxemia, heart failure and hypercapnia - Currently improved, on regular diet   Day of Discharge BP 119/68 mmHg  Pulse 68  Temp(Src) 97.6 F (36.4 C) (Oral)  Resp 18  Ht 5' 7"  (1.702 m)  Wt 128.3 kg (282 lb 13.6 oz)  BMI 44.29 kg/m2  SpO2 95%  Physical Exam: General: Alert and awake oriented x3 not  in any acute distress. HEENT: anicteric sclera, pupils reactive to light and accommodation CVS: S1-S2 clear no murmur rubs or gallops Chest: clear to auscultation bilaterally, no wheezing rales or rhonchi Abdomen: soft nontender, nondistended, normal bowel sounds Extremities: no cyanosis, clubbing, 1+ edema noted bilaterally Neuro: Cranial nerves II-XII intact, no focal neurological deficits   The results of significant diagnostics from this hospitalization (including imaging, microbiology, ancillary and laboratory) are listed below for reference.    LAB RESULTS: Basic Metabolic Panel:  Recent Labs Lab 04/22/14 0525 04/23/14 0437  NA 142 142  K 3.7 3.9  CL 103 102  CO2 31 31  GLUCOSE 121* 122*  BUN 46* 47*  CREATININE 1.78* 1.84*  CALCIUM 9.0 9.2  MG 2.2 2.2  PHOS 4.6 4.6   Liver Function Tests:  Recent Labs Lab 04/22/14 0525 04/23/14 0437  ALBUMIN 3.2* 3.2*   No results for input(s): LIPASE, AMYLASE in the last 168 hours. No results for input(s): AMMONIA in the last 168 hours. CBC:  Recent Labs Lab 04/22/14 0525 04/23/14 0437  WBC 5.0 5.4  HGB 7.9* 7.8*  HCT 28.1* 27.9*  MCV 89.5 89.1  PLT 112* 110*   Cardiac Enzymes: No results for input(s): CKTOTAL, CKMB, CKMBINDEX, TROPONINI in the last 168 hours. BNP: Invalid input(s): POCBNP CBG:  Recent Labs Lab  04/23/14 0611 04/23/14 1123  GLUCAP 123* 124*    Significant Diagnostic Studies:  Dg Chest Port 1 View  04/14/2014   CLINICAL DATA:  Central line placement.  EXAM: PORTABLE CHEST - 1 VIEW  COMPARISON:  Chest x-ray from the same day at 00:38  FINDINGS: New right IJ central line, tip near the right IJ/ brachiocephalic confluence. A left IJ central line is in similar position, tip at the upper SVC. The endotracheal tube and orogastric tube remains in good position.  Unchanged cardiomegaly and upper mediastinal widening. There is diffuse interstitial and airspace opacity consistent with pulmonary edema. Layering pleural effusions are likely. No pneumothorax.  IMPRESSION: 1. New right neck central line, tip near the right IJ/ brachiocephalic vein confluence. No pneumothorax. 2. Pre-existing hardware is in unchanged position. 3. Unchanged pulmonary edema.   Electronically Signed   By: Monte Fantasia M.D.   On: 04/14/2014 05:32   Dg Chest Portable 1 View  04/14/2014   CLINICAL DATA:  Endotracheal tube placement.  EXAM: PORTABLE CHEST - 1 VIEW  COMPARISON:  Earlier this day at 0008 hr  FINDINGS: Endotracheal tube 3.3 cm from the carina. Enteric tube in place, mid and distal portions not well seen, tip obscure due to multiple overlying artifacts. Tip of the left central line in the SVC. Cardiomegaly is grossly stable. Bilateral perihilar opacities, minimally improved in the right lung. Bilateral pleural effusions are unchanged.  IMPRESSION: 1. Endotracheal tube 3.3 cm from the carina. 2. Enteric tube in place, however not well visualized beyond the mid distal esophagus due to overlying monitoring devices.   Electronically Signed   By: Jeb Levering M.D.   On: 04/14/2014 01:56   Dg Chest Portable 1 View  04/14/2014   CLINICAL DATA:  Shortness of breath.  Central line placement  EXAM: PORTABLE CHEST - 1 VIEW  COMPARISON:  04/13/2014  FINDINGS: New left IJ catheter, tip at the upper SVC. No indication of  pneumothorax.  Progressive bilateral lung opacification. There are layering pleural effusions. Stable cardiopericardial enlargement and vascular pedicle widening.  IMPRESSION: 1. New left IJ catheter is in good position. No visible pneumothorax. 2. Progressive CHF.  Electronically Signed   By: Monte Fantasia M.D.   On: 04/14/2014 00:58   Dg Chest Portable 1 View  04/13/2014   CLINICAL DATA:  Shortness of breath and bradycardia  EXAM: PORTABLE CHEST - 1 VIEW  COMPARISON:  02/20/2014  FINDINGS: Chronic cardiomegaly, with size accentuated by increase in lower mediastinal fat on recent CT. There is widening of the vascular pedicle which is also also stable.  Haziness of the lower chest compatible with pleural effusions and atelectasis. There is pulmonary venous congestion and probable basilar alveolar edema.  IMPRESSION: CHF pattern.   Electronically Signed   By: Monte Fantasia M.D.   On: 04/13/2014 22:15   Dg Abd Portable 1v  04/14/2014   CLINICAL DATA:  Metabolic acidosis.  EXAM: PORTABLE ABDOMEN - 1 VIEW  COMPARISON:  02/19/2014  FINDINGS: There is a paucity of gas throughout the abdomen. No convincing evidence for bowel obstruction. NG tube is in the stomach. No free air on this portable supine study. No visible organomegaly or suspicious calcification.  IMPRESSION: Nonspecific bowel gas pattern with relative paucity of gas. No evidence of obstruction.   Electronically Signed   By: Rolm Baptise M.D.   On: 04/14/2014 09:19    2D ECHO: Study Conclusions  - Left ventricle: The cavity size was normal. Wall thickness was normal. Systolic function was normal. The estimated ejection fraction was in the range of 60% to 65%. Wall motion was normal; there were no regional wall motion abnormalities. Acoustic contrast opacification revealed no evidence ofthrombus. - Ventricular septum: The contour showed diastolic flattening and systolic flattening. These changes are consistent with RV  volume and pressure overload. - Left atrium: The atrium was moderately dilated. - Right ventricle: The cavity size was moderately dilated. Systolic function was mildly reduced. - Right atrium: The atrium was moderately to severely dilated. - Tricuspid valve: There was at least mild-moderate regurgitation. - Pulmonary arteries: Systolic pressure was moderately increased. PA peak pressure: 58 mm Hg (S).  Impressions:  - Compared to February 4166, LV systolic function has returned to normal.  Disposition and Follow-up: Discharge Instructions    (HEART FAILURE PATIENTS) Call MD:  Anytime you have any of the following symptoms: 1) 3 pound weight gain in 24 hours or 5 pounds in 1 week 2) shortness of breath, with or without a dry hacking cough 3) swelling in the hands, feet or stomach 4) if you have to sleep on extra pillows at night in order to breathe.    Complete by:  As directed      Diet Carb Modified    Complete by:  As directed      Increase activity slowly    Complete by:  As directed             DISPOSITION: Skilled nursing facility   DISCHARGE FOLLOW-UP Follow-up Information    Follow up with Lorretta Harp, MD On 05/20/2014.   Specialty:  Cardiology   Why:   at 11:45am for hospital follow-up   Contact information:   [address] [place] [address] [phone]       Follow up with William Gainer, MD. Schedule an appointment as soon as possible for a visit in 2 weeks.   Specialty:  Family Medicine   Why:  for hospital follow-up   Contact information:   [place] [phone]        Time spent on Discharge: 40 minutes  Signed:   Liela Rylee M.D.  [facility] 04/23/2014, 1:34 PM Pager: [phone]

## 2014-04-23 NOTE — Progress Notes (Signed)
Physical Therapy Treatment Patient Details Name: William Flores MRN: [id number] DOB: 01-22-1950 Today's Date: 04/23/2014    History of Present Illness 64 y/o male came to the [facility] ED on 4/10 by EMS for "low oxygen and trouble breathing". He was found to be in cardiogenic shock, bradycardic, in acute renal failure, hyperkalemic, and with lactic acidosis. Recent admission2/10 with respiratory failure. PMH of Afib, sleep apnea, heart failure. Intubated  4/10-4/13 with CVVHD stopped 4/14    PT Comments    Pt progressing well with mobility, improved O2 sats with walking. He ambulated 150' with RW and 3L O2 with O2 sats 92-94%. No LOB.   Follow Up Recommendations  SNF     Equipment Recommendations  Other (comment) (rollator )    Recommendations for Other Services       Precautions / Restrictions Precautions Precautions: Fall Precaution Comments: monitor O2 Restrictions Weight Bearing Restrictions: No    Mobility  Bed Mobility               General bed mobility comments: up in recliner  Transfers Overall transfer level: Modified independent Equipment used: Rolling walker (2 wheeled) Transfers: Sit to/from Stand Sit to Stand: Modified independent (Device/Increase time)         General transfer comment: uses armrests to push to stand and to control descent to recliner  Ambulation/Gait Ambulation/Gait assistance: Supervision Ambulation Distance (Feet): 150 Feet Assistive device: Rolling walker (2 wheeled) Gait Pattern/deviations: Step-through pattern;Decreased stride length;Trunk flexed;Wide base of support   Gait velocity interpretation: Below normal speed for age/gender General Gait Details: SaO2 93% on 3L O2 [place] with walking, no LOB   Stairs            Wheelchair Mobility    Modified Rankin (Stroke Patients Only)       Balance                                    Cognition Arousal/Alertness: Awake/alert Behavior During Therapy: WFL  for tasks assessed/performed Overall Cognitive Status: Within Functional Limits for tasks assessed                      Exercises      General Comments        Pertinent Vitals/Pain Pain Assessment: No/denies pain    Home Living                      Prior Function            PT Goals (current goals can now be found in the care plan section) Acute Rehab PT Goals Patient Stated Goal: go hunting and fishing PT Goal Formulation: With patient Time For Goal Achievement: 05/02/14 Potential to Achieve Goals: Good    Frequency  Min 3X/week    PT Plan Current plan remains appropriate (pt lives alone and needs to achieve modified independent )    Co-evaluation             End of Session Equipment Utilized During Treatment: Oxygen Activity Tolerance: Patient limited by fatigue Patient left: in chair;with call bell/phone within reach     Time: 1016-1027 PT Time Calculation (min) (ACUTE ONLY): 11 min  Charges:  $Gait Training: 8-22 mins                    G Codes:      Blondell Reveal [employer]  04/23/2014, 10:32 AM [phone]

## 2014-04-23 NOTE — Clinical Social Work Note (Signed)
Patient will discharge to [facility] Anticipated discharge date:04/23/14 Family notified: pt to notify Transportation by [facility]- called at 1:45pm  CSW signing off.  Domenica Reamer, [place] Social Worker [phone]

## 2014-05-06 ENCOUNTER — Ambulatory Visit: Payer: BLUE CROSS/BLUE SHIELD | Admitting: Nurse Practitioner

## 2014-05-06 ENCOUNTER — Other Ambulatory Visit: Payer: Self-pay | Admitting: Nurse Practitioner

## 2014-05-07 ENCOUNTER — Telehealth: Payer: Self-pay | Admitting: Family Medicine

## 2014-05-07 NOTE — Telephone Encounter (Signed)
Last seen 03/20/14 MMM  Last filled 04/23/14 #20

## 2014-05-08 NOTE — Telephone Encounter (Signed)
done

## 2014-05-13 ENCOUNTER — Other Ambulatory Visit: Payer: Self-pay

## 2014-05-13 MED ORDER — BENZONATATE 100 MG PO CAPS: 100.0000 mg | ORAL_CAPSULE | Freq: Two times a day (BID) | ORAL | Status: DC | PRN

## 2014-05-13 NOTE — Telephone Encounter (Signed)
Last seen 03/20/14 MMM  Last filled 04/23/14 #20

## 2014-05-16 ENCOUNTER — Emergency Department (HOSPITAL_COMMUNITY): Payer: BLUE CROSS/BLUE SHIELD

## 2014-05-16 ENCOUNTER — Inpatient Hospital Stay (HOSPITAL_COMMUNITY): Payer: BLUE CROSS/BLUE SHIELD

## 2014-05-16 ENCOUNTER — Emergency Department (HOSPITAL_BASED_OUTPATIENT_CLINIC_OR_DEPARTMENT_OTHER): Payer: BLUE CROSS/BLUE SHIELD

## 2014-05-16 ENCOUNTER — Other Ambulatory Visit (HOSPITAL_COMMUNITY): Payer: Self-pay

## 2014-05-16 ENCOUNTER — Encounter (HOSPITAL_COMMUNITY): Payer: Self-pay | Admitting: Emergency Medicine

## 2014-05-16 ENCOUNTER — Inpatient Hospital Stay (HOSPITAL_COMMUNITY)
Admission: EM | Admit: 2014-05-16 | Discharge: 2014-05-22 | DRG: 208 | Disposition: A | Discharge status: Home Health | Payer: BLUE CROSS/BLUE SHIELD | Attending: Internal Medicine | Admitting: Internal Medicine

## 2014-05-16 DIAGNOSIS — D649 Anemia, unspecified: Secondary | ICD-10-CM | POA: Diagnosis present

## 2014-05-16 DIAGNOSIS — Z85038 Personal history of other malignant neoplasm of large intestine: Secondary | ICD-10-CM | POA: Diagnosis not present

## 2014-05-16 DIAGNOSIS — Z86010 Personal history of colonic polyps: Secondary | ICD-10-CM

## 2014-05-16 DIAGNOSIS — R600 Localized edema: Secondary | ICD-10-CM

## 2014-05-16 DIAGNOSIS — R001 Bradycardia, unspecified: Secondary | ICD-10-CM

## 2014-05-16 DIAGNOSIS — E662 Morbid (severe) obesity with alveolar hypoventilation: Secondary | ICD-10-CM | POA: Diagnosis present

## 2014-05-16 DIAGNOSIS — Z87891 Personal history of nicotine dependence: Secondary | ICD-10-CM | POA: Diagnosis not present

## 2014-05-16 DIAGNOSIS — Z452 Encounter for adjustment and management of vascular access device: Secondary | ICD-10-CM

## 2014-05-16 DIAGNOSIS — Z5181 Encounter for therapeutic drug level monitoring: Secondary | ICD-10-CM | POA: Diagnosis not present

## 2014-05-16 DIAGNOSIS — N189 Chronic kidney disease, unspecified: Secondary | ICD-10-CM | POA: Diagnosis present

## 2014-05-16 DIAGNOSIS — E1165 Type 2 diabetes mellitus with hyperglycemia: Secondary | ICD-10-CM | POA: Diagnosis present

## 2014-05-16 DIAGNOSIS — Z9981 Dependence on supplemental oxygen: Secondary | ICD-10-CM

## 2014-05-16 DIAGNOSIS — I482 Chronic atrial fibrillation, unspecified: Secondary | ICD-10-CM

## 2014-05-16 DIAGNOSIS — Z886 Allergy status to analgesic agent status: Secondary | ICD-10-CM

## 2014-05-16 DIAGNOSIS — J9621 Acute and chronic respiratory failure with hypoxia: Principal | ICD-10-CM

## 2014-05-16 DIAGNOSIS — E875 Hyperkalemia: Secondary | ICD-10-CM

## 2014-05-16 DIAGNOSIS — I4891 Unspecified atrial fibrillation: Secondary | ICD-10-CM | POA: Diagnosis not present

## 2014-05-16 DIAGNOSIS — R1310 Dysphagia, unspecified: Secondary | ICD-10-CM

## 2014-05-16 DIAGNOSIS — R0602 Shortness of breath: Secondary | ICD-10-CM | POA: Diagnosis present

## 2014-05-16 DIAGNOSIS — G4733 Obstructive sleep apnea (adult) (pediatric): Secondary | ICD-10-CM | POA: Diagnosis present

## 2014-05-16 DIAGNOSIS — I129 Hypertensive chronic kidney disease with stage 1 through stage 4 chronic kidney disease, or unspecified chronic kidney disease: Secondary | ICD-10-CM | POA: Diagnosis present

## 2014-05-16 DIAGNOSIS — R570 Cardiogenic shock: Secondary | ICD-10-CM

## 2014-05-16 DIAGNOSIS — E785 Hyperlipidemia, unspecified: Secondary | ICD-10-CM

## 2014-05-16 DIAGNOSIS — N179 Acute kidney failure, unspecified: Secondary | ICD-10-CM

## 2014-05-16 DIAGNOSIS — J9601 Acute respiratory failure with hypoxia: Secondary | ICD-10-CM

## 2014-05-16 DIAGNOSIS — Z9119 Patient's noncompliance with other medical treatment and regimen: Secondary | ICD-10-CM | POA: Diagnosis present

## 2014-05-16 DIAGNOSIS — E872 Acidosis, unspecified: Secondary | ICD-10-CM

## 2014-05-16 DIAGNOSIS — I509 Heart failure, unspecified: Secondary | ICD-10-CM

## 2014-05-16 DIAGNOSIS — I10 Essential (primary) hypertension: Secondary | ICD-10-CM

## 2014-05-16 DIAGNOSIS — J9600 Acute respiratory failure, unspecified whether with hypoxia or hypercapnia: Secondary | ICD-10-CM

## 2014-05-16 DIAGNOSIS — Z6841 Body Mass Index (BMI) 40.0 and over, adult: Secondary | ICD-10-CM

## 2014-05-16 DIAGNOSIS — Z7901 Long term (current) use of anticoagulants: Secondary | ICD-10-CM

## 2014-05-16 DIAGNOSIS — I455 Other specified heart block: Secondary | ICD-10-CM

## 2014-05-16 DIAGNOSIS — J9611 Chronic respiratory failure with hypoxia: Secondary | ICD-10-CM

## 2014-05-16 DIAGNOSIS — G9341 Metabolic encephalopathy: Secondary | ICD-10-CM | POA: Diagnosis present

## 2014-05-16 DIAGNOSIS — R609 Edema, unspecified: Secondary | ICD-10-CM

## 2014-05-16 DIAGNOSIS — E1122 Type 2 diabetes mellitus with diabetic chronic kidney disease: Secondary | ICD-10-CM | POA: Diagnosis present

## 2014-05-16 DIAGNOSIS — E1129 Type 2 diabetes mellitus with other diabetic kidney complication: Secondary | ICD-10-CM

## 2014-05-16 DIAGNOSIS — IMO0002 Reserved for concepts with insufficient information to code with codable children: Secondary | ICD-10-CM

## 2014-05-16 DIAGNOSIS — I50810 Right heart failure, unspecified: Secondary | ICD-10-CM

## 2014-05-16 DIAGNOSIS — I5043 Acute on chronic combined systolic (congestive) and diastolic (congestive) heart failure: Secondary | ICD-10-CM | POA: Diagnosis not present

## 2014-05-16 DIAGNOSIS — R579 Shock, unspecified: Secondary | ICD-10-CM

## 2014-05-16 DIAGNOSIS — D500 Iron deficiency anemia secondary to blood loss (chronic): Secondary | ICD-10-CM

## 2014-05-16 DIAGNOSIS — I428 Other cardiomyopathies: Secondary | ICD-10-CM

## 2014-05-16 HISTORY — DX: Morbid (severe) obesity with alveolar hypoventilation: E66.2

## 2014-05-16 HISTORY — DX: Chronic diastolic (congestive) heart failure: I50.32

## 2014-05-16 HISTORY — DX: Chronic atrial fibrillation, unspecified: I48.20

## 2014-05-16 LAB — CBC WITH DIFFERENTIAL/PLATELET
Basophils Absolute: 0.1 10*3/uL (ref 0.0–0.1)
Basophils Relative: 1 % (ref 0–1)
EOS PCT: 1 % (ref 0–5)
Eosinophils Absolute: 0.1 10*3/uL (ref 0.0–0.7)
HCT: 35 % — ABNORMAL LOW (ref 39.0–52.0)
Hemoglobin: 9.6 g/dL — ABNORMAL LOW (ref 13.0–17.0)
Lymphocytes Relative: 17 % (ref 12–46)
Lymphs Abs: 1.4 10*3/uL (ref 0.7–4.0)
MCH: 25.6 pg — AB (ref 26.0–34.0)
MCHC: 27.4 g/dL — AB (ref 30.0–36.0)
MCV: 93.3 fL (ref 78.0–100.0)
Monocytes Absolute: 1.1 10*3/uL — ABNORMAL HIGH (ref 0.1–1.0)
Monocytes Relative: 14 % — ABNORMAL HIGH (ref 3–12)
NEUTROS ABS: 5.6 10*3/uL (ref 1.7–7.7)
Neutrophils Relative %: 67 % (ref 43–77)
PLATELETS: 178 10*3/uL (ref 150–400)
RBC: 3.75 MIL/uL — AB (ref 4.22–5.81)
RDW: 20.4 % — ABNORMAL HIGH (ref 11.5–15.5)
WBC: 8.3 10*3/uL (ref 4.0–10.5)

## 2014-05-16 LAB — BASIC METABOLIC PANEL
ANION GAP: 12 (ref 5–15)
ANION GAP: 8 (ref 5–15)
ANION GAP: 10 (ref 5–15)
Anion gap: 11 (ref 5–15)
BUN: 43 mg/dL — AB (ref 6–20)
BUN: 45 mg/dL — ABNORMAL HIGH (ref 6–20)
BUN: 42 mg/dL — AB (ref 6–20)
BUN: 35 mg/dL — ABNORMAL HIGH (ref 6–20)
CALCIUM: 9.6 mg/dL (ref 8.9–10.3)
CHLORIDE: 107 mmol/L (ref 101–111)
CO2: 23 mmol/L (ref 22–32)
CO2: 25 mmol/L (ref 22–32)
CO2: 25 mmol/L (ref 22–32)
CO2: 30 mmol/L (ref 22–32)
CREATININE: 2.35 mg/dL — AB (ref 0.61–1.24)
CREATININE: 1.97 mg/dL — AB (ref 0.61–1.24)
Calcium: 9 mg/dL (ref 8.9–10.3)
Calcium: 9.8 mg/dL (ref 8.9–10.3)
Calcium: 8.7 mg/dL — ABNORMAL LOW (ref 8.9–10.3)
Chloride: 104 mmol/L (ref 101–111)
Chloride: 103 mmol/L (ref 101–111)
Chloride: 100 mmol/L — ABNORMAL LOW (ref 101–111)
Creatinine, Ser: 2.45 mg/dL — ABNORMAL HIGH (ref 0.61–1.24)
Creatinine, Ser: 2.13 mg/dL — ABNORMAL HIGH (ref 0.61–1.24)
GFR calc Af Amer: 32 mL/min — ABNORMAL LOW (ref >60)
GFR calc Af Amer: 31 mL/min — ABNORMAL LOW (ref >60)
GFR calc Af Amer: 37 mL/min — ABNORMAL LOW (ref >60)
GFR calc non Af Amer: 32 mL/min — ABNORMAL LOW (ref >60)
GFR calc non Af Amer: 35 mL/min — ABNORMAL LOW (ref >60)
GFR, EST AFRICAN AMERICAN: 40 mL/min — AB (ref >60)
GFR, EST NON AFRICAN AMERICAN: 28 mL/min — AB (ref >60)
GFR, EST NON AFRICAN AMERICAN: 27 mL/min — AB (ref >60)
Glucose, Bld: 181 mg/dL — ABNORMAL HIGH (ref 65–99)
Glucose, Bld: 215 mg/dL — ABNORMAL HIGH (ref 65–99)
Glucose, Bld: 243 mg/dL — ABNORMAL HIGH (ref 65–99)
Glucose, Bld: 258 mg/dL — ABNORMAL HIGH (ref 65–99)
Potassium: 7.4 mmol/L (ref 3.5–5.1)
Potassium: 6.5 mmol/L (ref 3.5–5.1)
Potassium: 5.6 mmol/L — ABNORMAL HIGH (ref 3.5–5.1)
Sodium: 138 mmol/L (ref 135–145)
Sodium: 140 mmol/L (ref 135–145)
Sodium: 140 mmol/L (ref 135–145)
Sodium: 140 mmol/L (ref 135–145)

## 2014-05-16 LAB — I-STAT CHEM 8, ED
BUN: 46 mg/dL — ABNORMAL HIGH (ref 6–20)
CHLORIDE: 105 mmol/L (ref 101–111)
Calcium, Ion: 1.18 mmol/L (ref 1.13–1.30)
Creatinine, Ser: 2.5 mg/dL — ABNORMAL HIGH (ref 0.61–1.24)
Glucose, Bld: 186 mg/dL — ABNORMAL HIGH (ref 65–99)
HCT: 36 % — ABNORMAL LOW (ref 39.0–52.0)
Hemoglobin: 12.2 g/dL — ABNORMAL LOW (ref 13.0–17.0)
Potassium: 7.3 mmol/L (ref 3.5–5.1)
SODIUM: 138 mmol/L (ref 135–145)
TCO2: 23 mmol/L (ref 0–100)

## 2014-05-16 LAB — GLUCOSE, CAPILLARY
GLUCOSE-CAPILLARY: 179 mg/dL — AB (ref 65–99)
Glucose-Capillary: 162 mg/dL — ABNORMAL HIGH (ref 65–99)
Glucose-Capillary: 143 mg/dL — ABNORMAL HIGH (ref 65–99)
Glucose-Capillary: 228 mg/dL — ABNORMAL HIGH (ref 65–99)

## 2014-05-16 LAB — RENAL FUNCTION PANEL
ALBUMIN: 3.3 g/dL — AB (ref 3.5–5.0)
Anion gap: 9 (ref 5–15)
BUN: 40 mg/dL — ABNORMAL HIGH (ref 6–20)
CO2: 29 mmol/L (ref 22–32)
Calcium: 9.8 mg/dL (ref 8.9–10.3)
Chloride: 103 mmol/L (ref 101–111)
Creatinine, Ser: 2.14 mg/dL — ABNORMAL HIGH (ref 0.61–1.24)
GFR calc Af Amer: 36 mL/min — ABNORMAL LOW (ref >60)
GFR calc non Af Amer: 31 mL/min — ABNORMAL LOW (ref >60)
Glucose, Bld: 242 mg/dL — ABNORMAL HIGH (ref 65–99)
PHOSPHORUS: 2.8 mg/dL (ref 2.5–4.6)
POTASSIUM: 6.8 mmol/L — AB (ref 3.5–5.1)
Sodium: 141 mmol/L (ref 135–145)

## 2014-05-16 LAB — I-STAT ARTERIAL BLOOD GAS, ED
ACID-BASE DEFICIT: 3 mmol/L — AB (ref 0.0–2.0)
Acid-base deficit: 1 mmol/L (ref 0.0–2.0)
Bicarbonate: 25.5 mEq/L — ABNORMAL HIGH (ref 20.0–24.0)
Bicarbonate: 26 mEq/L — ABNORMAL HIGH (ref 20.0–24.0)
O2 Saturation: 93 %
O2 Saturation: 99 %
PCO2 ART: 51.6 mmHg — AB (ref 35.0–45.0)
PH ART: 7.31 — AB (ref 7.350–7.450)
Patient temperature: 98.6
TCO2: 27 mmol/L (ref 0–100)
TCO2: 28 mmol/L (ref 0–100)
pCO2 arterial: 58.8 mmHg (ref 35.0–45.0)
pH, Arterial: 7.246 — ABNORMAL LOW (ref 7.350–7.450)
pO2, Arterial: 80 mmHg (ref 80.0–100.0)
pO2, Arterial: 158 mmHg — ABNORMAL HIGH (ref 80.0–100.0)

## 2014-05-16 LAB — URINALYSIS, ROUTINE W REFLEX MICROSCOPIC
Bilirubin Urine: NEGATIVE
Glucose, UA: NEGATIVE mg/dL
Hgb urine dipstick: NEGATIVE
Ketones, ur: NEGATIVE mg/dL
LEUKOCYTES UA: NEGATIVE
Nitrite: NEGATIVE
PROTEIN: 30 mg/dL — AB
SPECIFIC GRAVITY, URINE: 1.013 (ref 1.005–1.030)
Urobilinogen, UA: 0.2 mg/dL (ref 0.0–1.0)
pH: 5 (ref 5.0–8.0)

## 2014-05-16 LAB — BRAIN NATRIURETIC PEPTIDE: B Natriuretic Peptide: 147.4 pg/mL — ABNORMAL HIGH (ref 0.0–100.0)

## 2014-05-16 LAB — HEPATITIS B SURFACE ANTIGEN: Hepatitis B Surface Ag: NEGATIVE

## 2014-05-16 LAB — URINE MICROSCOPIC-ADD ON

## 2014-05-16 LAB — I-STAT TROPONIN, ED: Troponin i, poc: 0 ng/mL (ref 0.00–0.08)

## 2014-05-16 LAB — HEPATITIS B SURFACE ANTIBODY,QUALITATIVE: Hep B S Ab: NEGATIVE

## 2014-05-16 LAB — MRSA PCR SCREENING: MRSA by PCR: NEGATIVE

## 2014-05-16 LAB — PROTIME-INR
INR: 1.74 — AB (ref 0.00–1.49)
Prothrombin Time: 20.5 seconds — ABNORMAL HIGH (ref 11.6–15.2)

## 2014-05-16 LAB — CORTISOL: CORTISOL PLASMA: 15.1 ug/dL

## 2014-05-16 MED ORDER — HEPARIN BOLUS VIA INFUSION (CRRT)
1000.0000 [IU] | INTRAVENOUS | Status: DC | PRN
  Filled 2014-05-16: qty 1000

## 2014-05-16 MED ORDER — ONDANSETRON HCL 4 MG/2ML IJ SOLN
INTRAMUSCULAR | Status: AC
  Administered 2014-05-16: 8 mg
  Filled 2014-05-16: qty 4

## 2014-05-16 MED ORDER — ALTEPLASE 2 MG IJ SOLR: 2.0000 mg | Freq: Once | INTRAMUSCULAR | Status: AC | PRN

## 2014-05-16 MED ORDER — HEPARIN SODIUM (PORCINE) 5000 UNIT/ML IJ SOLN
5000.0000 [IU] | Freq: Three times a day (TID) | INTRAMUSCULAR | Status: DC
  Administered 2014-05-16 – 2014-05-18 (×6): 5000 [IU] via SUBCUTANEOUS
  Filled 2014-05-16 (×11): qty 1

## 2014-05-16 MED ORDER — SODIUM POLYSTYRENE SULFONATE 15 GM/60ML PO SUSP
30.0000 g | Freq: Once | ORAL | Status: AC
  Administered 2014-05-16: 30 g via ORAL
  Filled 2014-05-16: qty 120

## 2014-05-16 MED ORDER — DOCUSATE SODIUM 50 MG/5ML PO LIQD
100.0000 mg | Freq: Two times a day (BID) | ORAL | Status: DC | PRN
  Filled 2014-05-16: qty 10

## 2014-05-16 MED ORDER — LIDOCAINE-PRILOCAINE 2.5-2.5 % EX CREA
1.0000 "application " | TOPICAL_CREAM | CUTANEOUS | Status: DC | PRN
  Filled 2014-05-16: qty 5

## 2014-05-16 MED ORDER — ETOMIDATE 2 MG/ML IV SOLN
INTRAVENOUS | Status: AC
  Filled 2014-05-16: qty 10

## 2014-05-16 MED ORDER — ALBUTEROL SULFATE (2.5 MG/3ML) 0.083% IN NEBU: 2.5000 mg | INHALATION_SOLUTION | RESPIRATORY_TRACT | Status: DC | PRN

## 2014-05-16 MED ORDER — NEPRO/CARBSTEADY PO LIQD
237.0000 mL | ORAL | Status: DC | PRN
  Filled 2014-05-16: qty 237

## 2014-05-16 MED ORDER — MIDAZOLAM HCL 2 MG/2ML IJ SOLN
2.0000 mg | Freq: Once | INTRAMUSCULAR | Status: AC
  Administered 2014-05-16: 2 mg via INTRAVENOUS

## 2014-05-16 MED ORDER — PROPOFOL 1000 MG/100ML IV EMUL
INTRAVENOUS | Status: AC
  Administered 2014-05-16: 1000 mg
  Filled 2014-05-16: qty 100

## 2014-05-16 MED ORDER — SODIUM BICARBONATE 8.4 % IV SOLN
INTRAVENOUS | Status: AC
  Filled 2014-05-16: qty 50

## 2014-05-16 MED ORDER — DEXTROSE 50 % IV SOLN
50.0000 mL | Freq: Once | INTRAVENOUS | Status: AC
  Administered 2014-05-16: 50 mL via INTRAVENOUS
  Filled 2014-05-16: qty 50

## 2014-05-16 MED ORDER — SODIUM BICARBONATE 8.4 % IV SOLN
100.0000 meq | Freq: Once | INTRAVENOUS | Status: DC
  Filled 2014-05-16: qty 100

## 2014-05-16 MED ORDER — SODIUM CHLORIDE 0.9 % IV SOLN: 100.0000 mL | INTRAVENOUS | Status: DC | PRN

## 2014-05-16 MED ORDER — SODIUM CHLORIDE 0.9 % IV SOLN
INTRAVENOUS | Status: DC
  Administered 2014-05-16: 1000 mL via INTRAVENOUS

## 2014-05-16 MED ORDER — FUROSEMIDE 10 MG/ML IJ SOLN
80.0000 mg | Freq: Once | INTRAMUSCULAR | Status: AC
  Administered 2014-05-16: 80 mg via INTRAVENOUS
  Filled 2014-05-16: qty 8

## 2014-05-16 MED ORDER — DOPAMINE-DEXTROSE 3.2-5 MG/ML-% IV SOLN
0.0000 ug/kg/min | Freq: Once | INTRAVENOUS | Status: AC
  Administered 2014-05-16: 10 ug/kg/min via INTRAVENOUS
  Filled 2014-05-16: qty 250

## 2014-05-16 MED ORDER — FENTANYL CITRATE (PF) 100 MCG/2ML IJ SOLN: 50.0000 ug | Freq: Once | INTRAMUSCULAR | Status: DC

## 2014-05-16 MED ORDER — DEXTROSE 50 % IV SOLN
1.0000 | Freq: Once | INTRAVENOUS | Status: AC
  Administered 2014-05-16: 50 mL via INTRAVENOUS
  Filled 2014-05-16: qty 50

## 2014-05-16 MED ORDER — ETOMIDATE 2 MG/ML IV SOLN
INTRAVENOUS | Status: AC
  Administered 2014-05-16: 20 mg
  Filled 2014-05-16: qty 20

## 2014-05-16 MED ORDER — LIDOCAINE HCL (CARDIAC) 20 MG/ML IV SOLN
INTRAVENOUS | Status: AC
  Filled 2014-05-16: qty 5

## 2014-05-16 MED ORDER — PRISMASOL BGK 0/2.5 32-2.5 MEQ/L IV SOLN
INTRAVENOUS | Status: DC
  Filled 2014-05-16: qty 5000

## 2014-05-16 MED ORDER — SODIUM BICARBONATE 8.4 % IV SOLN
50.0000 meq | Freq: Once | INTRAVENOUS | Status: AC
  Administered 2014-05-16: 50 meq via INTRAVENOUS
  Filled 2014-05-16: qty 50

## 2014-05-16 MED ORDER — HEPARIN SODIUM (PORCINE) 1000 UNIT/ML DIALYSIS
1000.0000 [IU] | INTRAMUSCULAR | Status: DC | PRN
  Filled 2014-05-16: qty 1

## 2014-05-16 MED ORDER — MIDAZOLAM HCL 2 MG/2ML IJ SOLN: 2.0000 mg | INTRAMUSCULAR | Status: DC | PRN

## 2014-05-16 MED ORDER — DEXTROSE 50 % IV SOLN
INTRAVENOUS | Status: AC
  Filled 2014-05-16: qty 50

## 2014-05-16 MED ORDER — LIDOCAINE HCL (PF) 1 % IJ SOLN: 5.0000 mL | INTRAMUSCULAR | Status: DC | PRN

## 2014-05-16 MED ORDER — DOPAMINE-DEXTROSE 3.2-5 MG/ML-% IV SOLN
5.0000 ug/kg/min | INTRAVENOUS | Status: DC
  Administered 2014-05-16: 5 ug/kg/min via INTRAVENOUS
  Filled 2014-05-16: qty 250

## 2014-05-16 MED ORDER — HEPARIN SODIUM (PORCINE) 1000 UNIT/ML DIALYSIS
1000.0000 [IU] | INTRAMUSCULAR | Status: DC | PRN
  Filled 2014-05-16: qty 6

## 2014-05-16 MED ORDER — HEPARIN SODIUM (PORCINE) 1000 UNIT/ML DIALYSIS: 20.0000 [IU]/kg | INTRAMUSCULAR | Status: DC | PRN

## 2014-05-16 MED ORDER — ASPIRIN 81 MG PO CHEW: 324.0000 mg | CHEWABLE_TABLET | ORAL | Status: AC

## 2014-05-16 MED ORDER — ONDANSETRON HCL 4 MG/2ML IJ SOLN
4.0000 mg | Freq: Four times a day (QID) | INTRAMUSCULAR | Status: DC | PRN
  Administered 2014-05-18: 4 mg via INTRAVENOUS
  Filled 2014-05-16: qty 2

## 2014-05-16 MED ORDER — FUROSEMIDE 10 MG/ML IJ SOLN
160.0000 mg | Freq: Three times a day (TID) | INTRAVENOUS | Status: DC
  Administered 2014-05-16 – 2014-05-17 (×3): 160 mg via INTRAVENOUS
  Filled 2014-05-16 (×3): qty 16

## 2014-05-16 MED ORDER — HEPARIN SODIUM (PORCINE) 1000 UNIT/ML IJ SOLN
1000.0000 [IU] | INTRAMUSCULAR | Status: DC | PRN
  Filled 2014-05-16: qty 5

## 2014-05-16 MED ORDER — PRISMASOL BGK 4/2.5 32-4-2.5 MEQ/L IV SOLN: INTRAVENOUS | Status: DC

## 2014-05-16 MED ORDER — PERFLUTREN LIPID MICROSPHERE
1.0000 mL | INTRAVENOUS | Status: AC | PRN
  Administered 2014-05-16: 2 mL via INTRAVENOUS
  Filled 2014-05-16: qty 10

## 2014-05-16 MED ORDER — FENTANYL BOLUS VIA INFUSION
50.0000 ug | INTRAVENOUS | Status: DC | PRN
  Administered 2014-05-16: 50 ug via INTRAVENOUS
  Filled 2014-05-16: qty 50

## 2014-05-16 MED ORDER — ROCURONIUM BROMIDE 50 MG/5ML IV SOLN
INTRAVENOUS | Status: AC
  Administered 2014-05-16: 50 mg
  Filled 2014-05-16: qty 2

## 2014-05-16 MED ORDER — CALCIUM CHLORIDE 10 % IV SOLN
1.0000 g | Freq: Once | INTRAVENOUS | Status: AC
  Administered 2014-05-16: 1 g via INTRAVENOUS
  Filled 2014-05-16: qty 10

## 2014-05-16 MED ORDER — SODIUM BICARBONATE 8.4 % IV SOLN
INTRAVENOUS | Status: DC
  Administered 2014-05-16 – 2014-05-17 (×3): via INTRAVENOUS
  Filled 2014-05-16 (×4): qty 150

## 2014-05-16 MED ORDER — SODIUM BICARBONATE 8.4 % IV SOLN
100.0000 meq | Freq: Once | INTRAVENOUS | Status: AC
  Administered 2014-05-16: 100 meq via INTRAVENOUS
  Filled 2014-05-16: qty 100

## 2014-05-16 MED ORDER — FAMOTIDINE 40 MG/5ML PO SUSR
20.0000 mg | Freq: Two times a day (BID) | ORAL | Status: DC
  Administered 2014-05-16 – 2014-05-17 (×3): 20 mg
  Filled 2014-05-16 (×4): qty 2.5

## 2014-05-16 MED ORDER — PRISMASOL BGK 0/2.5 32-2.5 MEQ/L IV SOLN
INTRAVENOUS | Status: DC
  Filled 2014-05-16 (×3): qty 5000

## 2014-05-16 MED ORDER — DEXTROSE 5 % IV SOLN
2.0000 ug/min | INTRAVENOUS | Status: DC
  Administered 2014-05-16: 2 ug/min via INTRAVENOUS
  Filled 2014-05-16: qty 4

## 2014-05-16 MED ORDER — ONDANSETRON HCL 40 MG/20ML IJ SOLN
8.0000 mg | Freq: Once | INTRAMUSCULAR | Status: AC
  Administered 2014-05-16: 8 mg via INTRAVENOUS
  Filled 2014-05-16: qty 4

## 2014-05-16 MED ORDER — INSULIN ASPART 100 UNIT/ML ~~LOC~~ SOLN
10.0000 [IU] | Freq: Once | SUBCUTANEOUS | Status: AC
  Administered 2014-05-16: 10 [IU] via INTRAVENOUS

## 2014-05-16 MED ORDER — SODIUM CHLORIDE 0.9 % IV SOLN
25.0000 ug/h | INTRAVENOUS | Status: DC
  Administered 2014-05-16: 50 ug/h via INTRAVENOUS
  Administered 2014-05-17: 175 ug/h via INTRAVENOUS
  Filled 2014-05-16 (×2): qty 50

## 2014-05-16 MED ORDER — DEXTROSE 50 % IV SOLN
1.0000 | Freq: Once | INTRAVENOUS | Status: DC
Start: 2014-05-16 — End: 2014-05-16
  Filled 2014-05-16: qty 50

## 2014-05-16 MED ORDER — CHLORHEXIDINE GLUCONATE 0.12 % MT SOLN
15.0000 mL | Freq: Two times a day (BID) | OROMUCOSAL | Status: DC
  Administered 2014-05-16 – 2014-05-17 (×3): 15 mL via OROMUCOSAL
  Filled 2014-05-16 (×4): qty 15

## 2014-05-16 MED ORDER — SUCCINYLCHOLINE CHLORIDE 20 MG/ML IJ SOLN
INTRAMUSCULAR | Status: AC
  Administered 2014-05-16: 20 mg
  Filled 2014-05-16: qty 1

## 2014-05-16 MED ORDER — SODIUM CHLORIDE 0.9 % IV SOLN: 250.0000 mL | INTRAVENOUS | Status: DC | PRN

## 2014-05-16 MED ORDER — INSULIN ASPART 100 UNIT/ML ~~LOC~~ SOLN
10.0000 [IU] | Freq: Once | SUBCUTANEOUS | Status: AC
  Administered 2014-05-16: 10 [IU] via INTRAVENOUS
  Filled 2014-05-16: qty 1

## 2014-05-16 MED ORDER — HEPARIN (PORCINE) 2000 UNITS/L FOR CRRT
INTRAVENOUS_CENTRAL | Status: DC | PRN
  Filled 2014-05-16: qty 1000

## 2014-05-16 MED ORDER — PRO-STAT SUGAR FREE PO LIQD
30.0000 mL | Freq: Four times a day (QID) | ORAL | Status: DC
  Administered 2014-05-16 – 2014-05-17 (×5): 30 mL
  Filled 2014-05-16 (×7): qty 30

## 2014-05-16 MED ORDER — CETYLPYRIDINIUM CHLORIDE 0.05 % MT LIQD
7.0000 mL | Freq: Four times a day (QID) | OROMUCOSAL | Status: DC
  Administered 2014-05-17 (×3): 7 mL via OROMUCOSAL

## 2014-05-16 MED ORDER — HEPARIN SODIUM (PORCINE) 5000 UNIT/ML IJ SOLN
250.0000 [IU]/h | INTRAMUSCULAR | Status: DC
  Filled 2014-05-16: qty 2

## 2014-05-16 MED ORDER — VITAL HIGH PROTEIN PO LIQD
1000.0000 mL | ORAL | Status: DC
  Filled 2014-05-16 (×2): qty 1000

## 2014-05-16 MED ORDER — PENTAFLUOROPROP-TETRAFLUOROETH EX AERO: 1.0000 "application " | INHALATION_SPRAY | CUTANEOUS | Status: DC | PRN

## 2014-05-16 MED ORDER — MIDAZOLAM HCL 2 MG/2ML IJ SOLN
INTRAMUSCULAR | Status: AC
  Administered 2014-05-16: 2 mg
  Filled 2014-05-16: qty 4

## 2014-05-16 MED ORDER — ACETAMINOPHEN 325 MG PO TABS
650.0000 mg | ORAL_TABLET | Freq: Four times a day (QID) | ORAL | Status: DC | PRN
  Administered 2014-05-20 – 2014-05-21 (×2): 650 mg via ORAL
  Filled 2014-05-16 (×2): qty 2

## 2014-05-16 MED ORDER — FUROSEMIDE 10 MG/ML IJ SOLN
8.0000 mg/h | INTRAVENOUS | Status: DC
  Administered 2014-05-16: 8 mg/h via INTRAVENOUS
  Filled 2014-05-16: qty 25

## 2014-05-16 MED ORDER — VITAL HIGH PROTEIN PO LIQD
1000.0000 mL | ORAL | Status: DC
  Administered 2014-05-16: 16:00:00
  Administered 2014-05-17 (×2): 1000 mL
  Filled 2014-05-16 (×3): qty 1000

## 2014-05-16 MED ORDER — ASPIRIN 300 MG RE SUPP
300.0000 mg | RECTAL | Status: AC
  Administered 2014-05-16: 300 mg via RECTAL
  Filled 2014-05-16: qty 1

## 2014-05-16 NOTE — Consult Note (Addendum)
CONSULTATION NOTE  Reason for Consult: Acute hypoxic respiratory failure  Requesting Physician: Dr. Jeneen Rinks  Cardiologist: Remotely Dr. [name] (Dr. Wynonia Lawman and advanced CHF seen recently)  HPI: This is a 64 y.o. male with a past medical history significant for chronic diastolic heart failure, obesity hypoventilation syndrome, chronic a-fib on warfarin, multiple recent admissions for hypoxic respiratory failure, renal failure and prior history of presumed non-ischemic cardiomyopathy (EF 35-40%, now up to 60-65% in 04/2014). He now presents with 2 hypoxic respiratory distress. On admission he was having significant difficulty speaking and was found to be hypoxic with a oxygen saturation of 80% on 2 L nasal cannula. He continued to have respiratory distress and ultimately was intubated. Chest x-ray shows cardiomegaly with pulmonary vascular congestion and bilateral pleural effusions. ABG was 7.24/59/80/26/+3. There is acute on chronic renal failure with a creatinine of 2.5 with a BUN of 45. Potassium was markedly elevated at 7.3. BNP is actually low at 147 and troponin negative. EKG shows A. fib with slow ventricular response in the 40s. Cardiology is asked to consult regarding possible cardiogenic shock.  PMHx:  Past Medical History  Diagnosis Date  . Diabetes   . Colon cancer   . Hypertension   . Sleep apnea   . Atrial fibrillation   . Hyperlipidemia   . CHF (congestive heart failure)   . Colon polyps 09/21/2012    Tubular adenoma   Past Surgical History  Procedure Laterality Date  . Colon resection    . Circumcision    . Colonoscopy N/A 09/21/2012    Procedure: COLONOSCOPY;  Surgeon: Inda Castle, MD;  Location: [facility];  Service: Endoscopy;  Laterality: N/A;  . US echocardiography  03/04/2010    abnormal study    FAMHx: Family History  Problem Relation Age of Onset  . Breast cancer Mother   . Diabetes Mother     SOCHx:  reports that he quit smoking about 30 years  ago. He has never used smokeless tobacco. He reports that he does not drink alcohol or use illicit drugs.  ALLERGIES: Allergies  Allergen Reactions  . Codeine     Headache     ROS: Review of systems not obtained due to patient factors.  HOME MEDICATIONS:   Medication List    ASK your doctor about these medications        allopurinol 100 MG tablet  Commonly known as:  ZYLOPRIM  Take 100 mg by mouth daily.     atorvastatin 20 MG tablet  Commonly known as:  LIPITOR  Take 1 tablet (20 mg total) by mouth daily at 6 PM.     azelastine 0.05 % ophthalmic solution  Commonly known as:  OPTIVAR  Place 2 drops into both eyes 2 (two) times daily.     benzonatate 100 MG capsule  Commonly known as:  TESSALON  TAKE 1 CAPSULE (100 MG TOTAL) BY MOUTH 2 (TWO) TIMES DAILY.     benzonatate 100 MG capsule  Commonly known as:  TESSALON  Take 1 capsule (100 mg total) by mouth 2 (two) times daily as needed for cough.     diltiazem 300 MG 24 hr capsule  Commonly known as:  CARDIZEM CD  Take 1 capsule (300 mg total) by mouth daily.     ferrous sulfate 325 (65 FE) MG tablet  Take 1 tablet (325 mg total) by mouth 2 (two) times daily with a meal.     furosemide 40 MG tablet  Commonly known as:  LASIX  Take 40 mg by mouth daily.     glucose blood test strip  Use to check BG daily     insulin aspart 100 UNIT/ML injection  Commonly known as:  novoLOG  Inject 0-9 Units into the skin 3 (three) times daily with meals. Sliding scale CBG 70 - 120: 0 units CBG 121 - 150: 1 unit,  CBG 151 - 200: 2 units,  CBG 201 - 250: 3 units,  CBG 251 - 300: 5 units,  CBG 301 - 350: 7 units,  CBG 351 - 400: 9 units   CBG > 400: 9 units and notify your MD     KLOR-CON M20 20 MEQ tablet  Generic drug:  potassium chloride SA  Take 10 mEq by mouth daily.     metoprolol tartrate 25 MG tablet  Commonly known as:  LOPRESSOR  Take 1 tablet (25 mg total) by mouth every 8 (eight) hours.     omeprazole 20 MG capsule   Commonly known as:  PRILOSEC  TAKE 1 CAPSULE (20 MG TOTAL) BY MOUTH DAILY.     ONETOUCH DELICA LANCETS 83J Misc  1 each by Does not apply route daily. Use one lancet to check BG daily     polyvinyl alcohol 1.4 % ophthalmic solution  Commonly known as:  LIQUIFILM TEARS  Place 1 drop into both eyes as needed for dry eyes.     warfarin 10 MG tablet  Commonly known as:  COUMADIN  Take 1 tablet (10 mg total) by mouth daily at 6 PM.       HOSPITAL MEDICATIONS: Reviewed  VITALS: Blood pressure 116/53, pulse 70, temperature 97 F (36.1 C), temperature source Axillary, resp. rate 26, weight 290 lb (131.543 kg), SpO2 97 %.  PHYSICAL EXAM: General appearance: intubated, sedated on ventilator Neck: no carotid bruit, no JVD and neck thic k Lungs: diminished breath sounds RLL and bases Heart: irregularly irregular rhythm and bradycardic Abdomen: soft, non-tender; bowel sounds normal; no masses,  no organomegaly Extremities: edema 3+ and venous stasis dermatitis noted Pulses: 2+ and symmetric Skin: Skin color, texture, turgor normal. No rashes or lesions Neurologic: Mental status: intubated, sedated on vent Psych: N/A  LABS: Results for orders placed or performed during the hospital encounter of 05/16/14 (from the past 48 hour(s))  Basic metabolic panel     Status: Abnormal   Collection Time: 05/16/14  6:18 AM  Result Value Ref Range   Sodium 138 135 - 145 mmol/L   Potassium 7.4 (HH) 3.5 - 5.1 mmol/L    Comment: NO VISIBLE HEMOLYSIS REPEATED TO VERIFY CRITICAL RESULT CALLED TO, READ BACK BY AND VERIFIED WITH: M.SCRUGGS,RN [id number] 05/16/14 CLARK,S    Chloride 104 101 - 111 mmol/L   CO2 23 22 - 32 mmol/L   Glucose, Bld 181 (H) 65 - 99 mg/dL   BUN 43 (H) 6 - 20 mg/dL   Creatinine, Ser 2.35 (H) 0.61 - 1.24 mg/dL   Calcium 9.0 8.9 - 10.3 mg/dL   GFR calc non Af Amer 28 (L) >60 mL/min   GFR calc Af Amer 32 (L) >60 mL/min    Comment: (NOTE) The eGFR has been calculated using the CKD  EPI equation. This calculation has not been validated in all clinical situations. eGFR's persistently <60 mL/min signify possible Chronic Kidney Disease.    Anion gap 11 5 - 15  CBC with Differential     Status: Abnormal   Collection Time: 05/16/14  6:18 AM  Result Value Ref  Range   WBC 8.3 4.0 - 10.5 K/uL   RBC 3.75 (L) 4.22 - 5.81 MIL/uL   Hemoglobin 9.6 (L) 13.0 - 17.0 g/dL   HCT 35.0 (L) 39.0 - 52.0 %   MCV 93.3 78.0 - 100.0 fL   MCH 25.6 (L) 26.0 - 34.0 pg   MCHC 27.4 (L) 30.0 - 36.0 g/dL   RDW 20.4 (H) 11.5 - 15.5 %   Platelets 178 150 - 400 K/uL   Neutrophils Relative % 67 43 - 77 %   Neutro Abs 5.6 1.7 - 7.7 K/uL   Lymphocytes Relative 17 12 - 46 %   Lymphs Abs 1.4 0.7 - 4.0 K/uL   Monocytes Relative 14 (H) 3 - 12 %   Monocytes Absolute 1.1 (H) 0.1 - 1.0 K/uL   Eosinophils Relative 1 0 - 5 %   Eosinophils Absolute 0.1 0.0 - 0.7 K/uL   Basophils Relative 1 0 - 1 %   Basophils Absolute 0.1 0.0 - 0.1 K/uL  Brain natriuretic peptide     Status: Abnormal   Collection Time: 05/16/14  6:18 AM  Result Value Ref Range   B Natriuretic Peptide 147.4 (H) 0.0 - 100.0 pg/mL  Protime-INR     Status: Abnormal   Collection Time: 05/16/14  6:18 AM  Result Value Ref Range   Prothrombin Time 20.5 (H) 11.6 - 15.2 seconds   INR 1.74 (H) 0.00 - 1.49  I-stat troponin, ED     Status: None   Collection Time: 05/16/14  6:31 AM  Result Value Ref Range   Troponin i, poc 0.00 0.00 - 0.08 ng/mL   Comment 3            Comment: Due to the release kinetics of cTnI, a negative result within the first hours of the onset of symptoms does not rule out myocardial infarction with certainty. If myocardial infarction is still suspected, repeat the test at appropriate intervals.   I-stat chem 8, ed     Status: Abnormal   Collection Time: 05/16/14  6:40 AM  Result Value Ref Range   Sodium 138 135 - 145 mmol/L   Potassium 7.3 (HH) 3.5 - 5.1 mmol/L   Chloride 105 101 - 111 mmol/L   BUN 46 (H) 6 - 20  mg/dL   Creatinine, Ser 2.50 (H) 0.61 - 1.24 mg/dL   Glucose, Bld 186 (H) 65 - 99 mg/dL   Calcium, Ion 1.18 1.13 - 1.30 mmol/L   TCO2 23 0 - 100 mmol/L   Hemoglobin 12.2 (L) 13.0 - 17.0 g/dL   HCT 36.0 (L) 39.0 - 52.0 %   Comment NOTIFIED PHYSICIAN   I-Stat arterial blood gas, ED     Status: Abnormal   Collection Time: 05/16/14  6:40 AM  Result Value Ref Range   pH, Arterial 7.246 (L) 7.350 - 7.450   pCO2 arterial 58.8 (HH) 35.0 - 45.0 mmHg   pO2, Arterial 80.0 80.0 - 100.0 mmHg   Bicarbonate 25.5 (H) 20.0 - 24.0 mEq/L   TCO2 27 0 - 100 mmol/L   O2 Saturation 93.0 %   Acid-base deficit 3.0 (H) 0.0 - 2.0 mmol/L   Patient temperature 98.6 F    Collection site RADIAL, ALLEN'S TEST ACCEPTABLE    Drawn by RT    Sample type ARTERIAL    Comment NOTIFIED PHYSICIAN     IMAGING: Dg Chest Portable 1 View  05/16/2014   CLINICAL DATA:  Hypoxia  EXAM: PORTABLE CHEST - 1 VIEW  COMPARISON:  May 16, 2014  FINDINGS: Endotracheal tube tip is 4.6 cm above the carina. Central catheter tip is in the superior vena cava. Nasogastric tube tip and side port are below the diaphragm. No pneumothorax. There are bilateral pleural effusions with cardiomegaly and pulmonary venous hypertension. There is left lower lobe consolidation.  IMPRESSION: Tube and catheter positions as described without pneumothorax. Evidence of congestive heart failure. Opacity in the left base with consolidation may represent alveolar edema. Superimposed pneumonia is concerning in the left lower lobe.   Electronically Signed   By: Lowella Grip III M.D.   On: 05/16/2014 08:42   Dg Chest Port 1 View  05/16/2014   CLINICAL DATA:  Respiratory distress.  CHF.  EXAM: PORTABLE CHEST - 1 VIEW  COMPARISON:  04/17/2014  FINDINGS: Shallow inspiration. Cardiac enlargement with pulmonary vascular congestion. Bilateral pleural effusions with atelectasis in the lung bases. Perihilar airspace disease suggest edema. No pneumothorax. Calcification of the  aorta.  IMPRESSION: Cardiac enlargement with pulmonary vascular congestion, bilateral pleural effusions, and perihilar edema.   Electronically Signed   By: Lucienne Capers M.D.   On: 05/16/2014 06:31    HOSPITAL DIAGNOSES: Principal Problem:   Acute respiratory failure with hypoxia Active Problems:   DM type 2, uncontrolled, with renal complications   Morbid obesity   OSA (obstructive sleep apnea)   Atrial fibrillation with slow ventricular response   Hyperkalemia   AKI (acute kidney injury)   Shock circulatory   IMPRESSION: 1. Acute hypoxic respiratory failure 2. Pulmonary edema, low BNP 3. Recent LVEF 60-65%, mild RV dysfunction 4. OSA 5. A-fib with SVR 6. AKI with profound hyperkalemia  RECOMMENDATION: 1. Acute hypoxic respiratory failure-Mrs. likely combination of hypoventilation and pulmonary edema, possibly secondary to what appears to be renal failure. There is acute kidney injury and hyperkalemia which is in a life threatening range. I agree with intubation and ventilator management. I also agree with diuresis including a Lasix drip given his low blood pressure. He is currently on dopamine, I would wean that down and add levophed to keep MAP >60. I'm not certain that his pulmonary edema is cardiogenic given low BNP and recent normal LV function with only mild RV dysfunction. I will recommend a repeat limited echocardiogram to look for LV and RV function. Heart rate to be monitor closely in A. fib with SVR, however hyperkalemia is likely contributing to this. He will need urgent treatment of his hyperkalemia if not repeat dialysis. Recently he did undergo CVVHD. Trend troponins to rule out acute MI however they have been repeatedly negative during recent hospitalizations.  Thanks for consulting [place]. We will follow with you.  CRITICAL CARE:  The patient is critically ill with multi-organ system failure and requires high complexity decision making for assessment and support,  frequent evaluation and titration of therapies, application of advanced monitoring technologies and extensive interpretation of multiple databases.  Critical CareTime Spent Directly with Patient: 45 minutes  Pixie Casino, MD, [facility] Attending Cardiologist [place] 05/16/2014, 8:50 AM

## 2014-05-16 NOTE — ED Provider Notes (Signed)
CENTRAL LINE Date/Time: 05/16/2014 8:25 AM Performed by: Ellwood Dense Authorized by: Pamella Pert Consent: The procedure was performed in an emergent situation. Patient identity confirmed: arm band Time out: Immediately prior to procedure a "time out" was called to verify the correct patient, procedure, equipment, support staff and site/side marked as required. Local anesthetic: lidocaine 1% with epinephrine Anesthetic total: 5 ml Preparation: skin prepped with 2% chlorhexidine Skin prep agent dried: skin prep agent completely dried prior to procedure Sterile barriers: all five maximum sterile barriers used - cap, mask, sterile gown, sterile gloves, and large sterile sheet Hand hygiene: hand hygiene performed prior to central venous catheter insertion Location details: right internal jugular Patient position: flat Catheter type: triple lumen Pre-procedure: landmarks identified Ultrasound guidance: yes Sterile ultrasound techniques: sterile gel and sterile probe covers were used Number of attempts: 1 Successful placement: yes Post-procedure: line sutured and dressing applied Assessment: blood return through all ports,  free fluid flow and placement verified by x-ray Patient tolerance: Patient tolerated the procedure well with no immediate complications  Ellwood Dense, MD 05/16/14 0992  Pamella Pert, MD 05/16/14 1541

## 2014-05-16 NOTE — ED Provider Notes (Addendum)
Patient seen and evaluated with [facility] upon arrival. Dyspneic. Hypotensive and bradycardic. Improves with mask O2, and BiPAP. Does not appear frankly distressed despite his poor vital signs.  He is awake and alert and oriented. Exam, and chest x-ray show pulmonary edema and CHF.   i-STAT potassium 7.4 but specimen hemolyzed. Repeated and confirmed a 7.3. Given IV calcium chloride one amp, bicarbonate 1 amp, D50 1 amp, regular insulin 10 units IV. Given To his IV. Dopamine initiated.  Patient with progressive dyspnea. Desaturations despite mask O2 and BiPAP in the 70s and 80s. Patient intubated. INTUBATION Performed by: Lolita Patella  Required items: required blood products, implants, devices, and special equipment available Patient identity confirmed: provided demographic data and hospital-assigned identification number Time out: Immediately prior to procedure a "time out" was called to verify the correct patient, procedure, equipment, support staff and site/side marked as required.  Indications: Hypoxia with CHF   Intubation method: Glidescope Laryngoscopy   Preoxygenation: BVM  Sedatives: 20 mg Etomidate Paralytic: 150 mfSuccinylcholine  Tube Size: 7.5 cuffed  Post-procedure assessment: chest rise and ETCO2 monitor Breath sounds: equal and absent over the epigastrium Tube secured with: ETT holder Chest x-ray interpreted by radiologist and me.  Chest x-ray findings: +endotracheal tube in appropriate position  Patient tolerated the procedure well with no immediate complications.      Central access obtained by resident, Right IJ, see separate note.   Discussed with Intensivist, and cardiology.  CRITICAL CARE Performed by: Tanna Furry JOSEPH   Total critical care time: 88 Start:   07:00 Stop:  08:00  Critical care time was exclusive of separately billable procedures and treating other patients.  Critical care was necessary to treat or prevent imminent or  life-threatening deterioration.  Critical care was time spent personally by me on the following activities: development of treatment plan with patient and/or surrogate as well as nursing, discussions with consultants, evaluation of patient's response to treatment, examination of patient, obtaining history from patient or surrogate, ordering and performing treatments and interventions, ordering and review of laboratory studies, ordering and review of radiographic studies, pulse oximetry and re-evaluation of patient's condition. Care.    Tanna Furry, MD 05/16/14 [id number]  Tanna Furry, MD 05/16/14 [phone]

## 2014-05-16 NOTE — ED Notes (Signed)
Dopamine gtt started due to b/p of 84/40,

## 2014-05-16 NOTE — Progress Notes (Signed)
Attempted to call son, Derek Mound, prior to HD cath placement, was unable to reach.  Patient needing catheter placement emergently, do to critically high level of K+.  Called son again at this time, for consent of HD and to inform about catheter placement. Patients son agreed to HD,  and was updated on patients condition.

## 2014-05-16 NOTE — Progress Notes (Signed)
Rec'd pt on vent settings charted, per ED RT these vent settings were made by ED MD and Dr. [name].  Waiting on MD for vent order clarification.

## 2014-05-16 NOTE — Progress Notes (Signed)
Patient transported from ED to 1O10 without complications. Patient tolerated well. Vital signs stable at this time. RN at bedside. RT will continue to monitor.

## 2014-05-16 NOTE — ED Notes (Signed)
MD at bedside. Placing central line

## 2014-05-16 NOTE — Procedures (Signed)
Central Venous Catheter Insertion Procedure Note - HD cath LECIL TAPP [id number] [birth year]  Procedure: Insertion of Central Venous Catheter Indications: emergent HD  Procedure Details Consent: Unable to obtain consent because of emergent medical necessity. Time Out: Verified patient identification, verified procedure, site/side was marked, verified correct patient position, special equipment/implants available, medications/allergies/relevent history reviewed, required imaging and test results available.  Performed  Maximum sterile technique was used including antiseptics, cap, gloves, gown, hand hygiene, mask and sheet. Skin prep: Chlorhexidine; local anesthetic administered A antimicrobial bonded/coated triple lumen catheter was placed in the left internal jugular vein using the Seldinger technique.  Evaluation Blood flow good Complications: No apparent complications Patient did tolerate procedure well. Chest X-ray ordered to verify placement.  CXR: pending.  William Flores 05/16/2014, 1:51 PM  [place]  William Flores. [name], MD, [place] [facility]

## 2014-05-16 NOTE — ED Notes (Signed)
Central line equipment set up at bedside

## 2014-05-16 NOTE — Progress Notes (Signed)
  Echocardiogram 2D Echocardiogram has been performed.  Bobbye Charleston 05/16/2014, 12:25 PM

## 2014-05-16 NOTE — ED Notes (Signed)
Ems:  Resp distress, Hypotensive, hypoxic from home, recently dc from here, when he was in ICU.  While in was in ICU he coded.  Was low 80s% spo2 room air.

## 2014-05-16 NOTE — Progress Notes (Signed)
Utilization review completed.  

## 2014-05-16 NOTE — ED Notes (Signed)
Pt intubated by MD with 7.5 fr ET tube , secured by RT 23 at the lip , pt placed on vent,

## 2014-05-16 NOTE — Consult Note (Signed)
[facility] Renal Consultation Note  Requesting MD: [name] Indication for Consultation: A on chronic renal failure with hyperkalemia  HPI:  PARK BECK is a 64 y.o. male with a past medical history of DM, HTN, HLD, chronic diastolic CHF, atrial fibrillation on Coumadin, colon cancer s/p resection, obstructive sleep apnea, 2 L oxygen dependent, renal failure-( baseline creatinine of late around 1.8-2) , prior history of NICM (EF 35-40%, now up to 60-65% in 04/2014) and multiple recent admissions for hypoxic respiratory failure who presented to the [facility] ER on 5/13 with complaints of increasing shortness of breath. He was found to be hypotensive and bradycardic with room air saturations of 80%.  He required intubation and transient pressors.  His initial K was noted to be over 7- he was initially treated medically and was making urine- but repeat K's have continued to be over 7 so I am consulted.  It was thought originally that he would need CRRT- but his hemodynamics have improved and because K is at critical level- decision now is to do IHD to get K down quickly.  Pt is unresponsive on the vent.  He is very volume overloaded as well. He was on potassium supplement PTA    CREAT  Date/Time Value Ref Range Status  05/25/2012 03:56 PM 1.12 0.50 - 1.35 mg/dL Final   CREATININE, SER  Date/Time Value Ref Range Status  05/16/2014 11:27 AM 2.45* 0.61 - 1.24 mg/dL Final  05/16/2014 06:40 AM 2.50* 0.61 - 1.24 mg/dL Final  05/16/2014 06:18 AM 2.35* 0.61 - 1.24 mg/dL Final  04/23/2014 04:37 AM 1.84* 0.50 - 1.35 mg/dL Final  04/22/2014 05:25 AM 1.78* 0.50 - 1.35 mg/dL Final  04/21/2014 03:54 AM 1.93* 0.50 - 1.35 mg/dL Final  04/20/2014 05:25 AM 2.03* 0.50 - 1.35 mg/dL Final  04/19/2014 04:50 AM 1.84* 0.50 - 1.35 mg/dL Final  04/18/2014 04:30 AM 1.64* 0.50 - 1.35 mg/dL Final  04/17/2014 03:45 PM 1.42* 0.50 - 1.35 mg/dL Final  04/17/2014 04:30 AM 1.40* 0.50 - 1.35 mg/dL Final   04/17/2014 04:30 AM 1.43* 0.50 - 1.35 mg/dL Final  04/16/2014 03:09 PM 1.69* 0.50 - 1.35 mg/dL Final  04/16/2014 04:00 AM 2.01* 0.50 - 1.35 mg/dL Final  04/15/2014 03:15 PM 2.44* 0.50 - 1.35 mg/dL Final  04/15/2014 04:45 AM 2.98* 0.50 - 1.35 mg/dL Final  04/15/2014 01:41 AM 3.10* 0.50 - 1.35 mg/dL Final  04/14/2014 11:30 PM 3.67* 0.50 - 1.35 mg/dL Final  04/14/2014 05:11 PM 4.21* 0.50 - 1.35 mg/dL Final  04/14/2014 05:00 AM 5.10* 0.50 - 1.35 mg/dL Final  04/14/2014 02:25 AM 5.62* 0.50 - 1.35 mg/dL Final  04/14/2014 02:21 AM 5.64* 0.50 - 1.35 mg/dL Final  04/13/2014 11:23 PM 5.30* 0.50 - 1.35 mg/dL Final  04/13/2014 09:52 PM 5.80* 0.50 - 1.35 mg/dL Final  04/13/2014 09:40 PM 5.66* 0.50 - 1.35 mg/dL Final  03/25/2014 08:05 AM 1.35 0.40 - 1.50 mg/dL Final  03/19/2014 03:34 PM 1.65* 0.40 - 1.50 mg/dL Final  02/25/2014 04:15 AM 1.04 0.50 - 1.35 mg/dL Final  02/24/2014 03:13 AM 0.84 0.50 - 1.35 mg/dL Final  02/23/2014 04:23 AM 1.04 0.50 - 1.35 mg/dL Final  02/22/2014 04:00 AM 1.07 0.50 - 1.35 mg/dL Final  02/21/2014 05:00 AM 1.20 0.50 - 1.35 mg/dL Final  02/20/2014 10:30 AM 1.28 0.50 - 1.35 mg/dL Final  02/19/2014 09:30 PM 1.24 0.50 - 1.35 mg/dL Final  02/19/2014 04:23 AM 1.43* 0.50 - 1.35 mg/dL Final  02/18/2014 06:00 AM 1.35 0.50 - 1.35 mg/dL  Final  02/17/2014 03:50 AM 1.18 0.50 - 1.35 mg/dL Final  02/16/2014 04:00 AM 1.15 0.50 - 1.35 mg/dL Final  02/15/2014 03:50 AM 1.21 0.50 - 1.35 mg/dL Final  02/14/2014 11:00 PM 1.28 0.50 - 1.35 mg/dL Final  02/14/2014 04:20 AM 1.37* 0.50 - 1.35 mg/dL Final  02/13/2014 04:40 AM 1.59* 0.50 - 1.35 mg/dL Final    Comment:    DELTA CHECK NOTED  02/12/2014 07:39 AM 2.79* 0.50 - 1.35 mg/dL Final    Comment:    DELTA CHECK NOTED  02/11/2014 07:50 AM 1.62* 0.50 - 1.35 mg/dL Final  02/10/2014 02:01 PM 1.58* 0.50 - 1.35 mg/dL Final  01/16/2014 04:37 PM 1.15 0.76 - 1.27 mg/dL Final  11/13/2013 11:21 AM 1.62* 0.76 - 1.27 mg/dL Final  11/05/2013 06:40 AM  1.42* 0.50 - 1.35 mg/dL Final  11/04/2013 05:00 AM 1.39* 0.50 - 1.35 mg/dL Final  11/03/2013 05:35 AM 1.27 0.50 - 1.35 mg/dL Final  11/03/2013 12:00 AM 1.48* 0.50 - 1.35 mg/dL Final  11/02/2013 05:54 AM 1.29 0.50 - 1.35 mg/dL Final     PMHx:   Past Medical History  Diagnosis Date  . Diabetes   . Colon cancer   . Hypertension   . Sleep apnea   . Atrial fibrillation   . Hyperlipidemia   . CHF (congestive heart failure)   . Colon polyps 09/21/2012    Tubular adenoma    Past Surgical History  Procedure Laterality Date  . Colon resection    . Circumcision    . Colonoscopy N/A 09/21/2012    Procedure: COLONOSCOPY;  Surgeon: Inda Castle, MD;  Location: [facility];  Service: Endoscopy;  Laterality: N/A;  . US echocardiography  03/04/2010    abnormal study    Family Hx:  Family History  Problem Relation Age of Onset  . Breast cancer Mother   . Diabetes Mother     Social History:  reports that he quit smoking about 30 years ago. He has never used smokeless tobacco. He reports that he does not drink alcohol or use illicit drugs.  Allergies:  Allergies  Allergen Reactions  . Codeine     Headache     Medications: Prior to Admission medications   Medication Sig Start Date End Date Taking? Authorizing Provider  allopurinol (ZYLOPRIM) 100 MG tablet Take 100 mg by mouth daily. 03/29/14   Historical Provider, MD  atorvastatin (LIPITOR) 20 MG tablet Take 1 tablet (20 mg total) by mouth daily at 6 PM. 02/26/14   Janece Canterbury, MD  azelastine (OPTIVAR) 0.05 % ophthalmic solution Place 2 drops into both eyes 2 (two) times daily. 04/19/13   Vernie Shanks, MD  benzonatate (TESSALON) 100 MG capsule TAKE 1 CAPSULE (100 MG TOTAL) BY MOUTH 2 (TWO) TIMES DAILY. 05/07/14   Mary-Margaret Hassell Done, FNP  benzonatate (TESSALON) 100 MG capsule Take 1 capsule (100 mg total) by mouth 2 (two) times daily as needed for cough. 05/13/14   Mary-Margaret Hassell Done, FNP  diltiazem (CARDIZEM CD) 300 MG 24 hr  capsule Take 1 capsule (300 mg total) by mouth daily. 04/23/14   Ripudeep Krystal Eaton, MD  ferrous sulfate 325 (65 FE) MG tablet Take 1 tablet (325 mg total) by mouth 2 (two) times daily with a meal. 02/26/14   Janece Canterbury, MD  furosemide (LASIX) 40 MG tablet Take 40 mg by mouth daily. 01/18/14   Historical Provider, MD  glucose blood test strip Use to check BG daily 10/07/13   Tammy Eckard, PHARMD  insulin aspart (  NOVOLOG) 100 UNIT/ML injection Inject 0-9 Units into the skin 3 (three) times daily with meals. Sliding scale CBG 70 - 120: 0 units CBG 121 - 150: 1 unit,  CBG 151 - 200: 2 units,  CBG 201 - 250: 3 units,  CBG 251 - 300: 5 units,  CBG 301 - 350: 7 units,  CBG 351 - 400: 9 units   CBG > 400: 9 units and notify your MD 04/23/14   Ripudeep Krystal Eaton, MD  KLOR-CON M20 20 MEQ tablet Take 10 mEq by mouth daily. 03/21/14   Historical Provider, MD  metoprolol tartrate (LOPRESSOR) 25 MG tablet Take 1 tablet (25 mg total) by mouth every 8 (eight) hours. 04/23/14   Ripudeep Krystal Eaton, MD  omeprazole (PRILOSEC) 20 MG capsule TAKE 1 CAPSULE (20 MG TOTAL) BY MOUTH DAILY. 12/17/13   Lysbeth Penner, FNP  [facility] DELICA LANCETS 28B MISC 1 each by Does not apply route daily. Use one lancet to check BG daily 09/13/12   Tammy Eckard, PHARMD  polyvinyl alcohol (LIQUIFILM TEARS) 1.4 % ophthalmic solution Place 1 drop into both eyes as needed for dry eyes. 04/23/14   Ripudeep Krystal Eaton, MD  warfarin (COUMADIN) 10 MG tablet Take 1 tablet (10 mg total) by mouth daily at 6 PM. Patient not taking: Reported on 04/14/2014 02/26/14   Janece Canterbury, MD    I have reviewed the patient's current medications.  Labs:  Results for orders placed or performed during the hospital encounter of 05/16/14 (from the past 48 hour(s))  Basic metabolic panel     Status: Abnormal   Collection Time: 05/16/14  6:18 AM  Result Value Ref Range   Sodium 138 135 - 145 mmol/L   Potassium 7.4 (HH) 3.5 - 5.1 mmol/L    Comment: NO VISIBLE HEMOLYSIS REPEATED TO  VERIFY CRITICAL RESULT CALLED TO, READ BACK BY AND VERIFIED WITH: M.SCRUGGS,RN [id number] 05/16/14 CLARK,S    Chloride 104 101 - 111 mmol/L   CO2 23 22 - 32 mmol/L   Glucose, Bld 181 (H) 65 - 99 mg/dL   BUN 43 (H) 6 - 20 mg/dL   Creatinine, Ser 2.35 (H) 0.61 - 1.24 mg/dL   Calcium 9.0 8.9 - 10.3 mg/dL   GFR calc non Af Amer 28 (L) >60 mL/min   GFR calc Af Amer 32 (L) >60 mL/min    Comment: (NOTE) The eGFR has been calculated using the CKD EPI equation. This calculation has not been validated in all clinical situations. eGFR's persistently <60 mL/min signify possible Chronic Kidney Disease.    Anion gap 11 5 - 15  CBC with Differential     Status: Abnormal   Collection Time: 05/16/14  6:18 AM  Result Value Ref Range   WBC 8.3 4.0 - 10.5 K/uL   RBC 3.75 (L) 4.22 - 5.81 MIL/uL   Hemoglobin 9.6 (L) 13.0 - 17.0 g/dL   HCT 35.0 (L) 39.0 - 52.0 %   MCV 93.3 78.0 - 100.0 fL   MCH 25.6 (L) 26.0 - 34.0 pg   MCHC 27.4 (L) 30.0 - 36.0 g/dL   RDW 20.4 (H) 11.5 - 15.5 %   Platelets 178 150 - 400 K/uL   Neutrophils Relative % 67 43 - 77 %   Neutro Abs 5.6 1.7 - 7.7 K/uL   Lymphocytes Relative 17 12 - 46 %   Lymphs Abs 1.4 0.7 - 4.0 K/uL   Monocytes Relative 14 (H) 3 - 12 %   Monocytes Absolute 1.1 (H)  0.1 - 1.0 K/uL   Eosinophils Relative 1 0 - 5 %   Eosinophils Absolute 0.1 0.0 - 0.7 K/uL   Basophils Relative 1 0 - 1 %   Basophils Absolute 0.1 0.0 - 0.1 K/uL  Brain natriuretic peptide     Status: Abnormal   Collection Time: 05/16/14  6:18 AM  Result Value Ref Range   B Natriuretic Peptide 147.4 (H) 0.0 - 100.0 pg/mL  Protime-INR     Status: Abnormal   Collection Time: 05/16/14  6:18 AM  Result Value Ref Range   Prothrombin Time 20.5 (H) 11.6 - 15.2 seconds   INR 1.74 (H) 0.00 - 1.49  I-stat troponin, ED     Status: None   Collection Time: 05/16/14  6:31 AM  Result Value Ref Range   Troponin i, poc 0.00 0.00 - 0.08 ng/mL   Comment 3            Comment: Due to the release kinetics of  cTnI, a negative result within the first hours of the onset of symptoms does not rule out myocardial infarction with certainty. If myocardial infarction is still suspected, repeat the test at appropriate intervals.   I-stat chem 8, ed     Status: Abnormal   Collection Time: 05/16/14  6:40 AM  Result Value Ref Range   Sodium 138 135 - 145 mmol/L   Potassium 7.3 (HH) 3.5 - 5.1 mmol/L   Chloride 105 101 - 111 mmol/L   BUN 46 (H) 6 - 20 mg/dL   Creatinine, Ser 2.50 (H) 0.61 - 1.24 mg/dL   Glucose, Bld 186 (H) 65 - 99 mg/dL   Calcium, Ion 1.18 1.13 - 1.30 mmol/L   TCO2 23 0 - 100 mmol/L   Hemoglobin 12.2 (L) 13.0 - 17.0 g/dL   HCT 36.0 (L) 39.0 - 52.0 %   Comment NOTIFIED PHYSICIAN   I-Stat arterial blood gas, ED     Status: Abnormal   Collection Time: 05/16/14  6:40 AM  Result Value Ref Range   pH, Arterial 7.246 (L) 7.350 - 7.450   pCO2 arterial 58.8 (HH) 35.0 - 45.0 mmHg   pO2, Arterial 80.0 80.0 - 100.0 mmHg   Bicarbonate 25.5 (H) 20.0 - 24.0 mEq/L   TCO2 27 0 - 100 mmol/L   O2 Saturation 93.0 %   Acid-base deficit 3.0 (H) 0.0 - 2.0 mmol/L   Patient temperature 98.6 F    Collection site RADIAL, ALLEN'S TEST ACCEPTABLE    Drawn by RT    Sample type ARTERIAL    Comment NOTIFIED PHYSICIAN   Urinalysis, Routine w reflex microscopic     Status: Abnormal   Collection Time: 05/16/14  8:22 AM  Result Value Ref Range   Color, Urine YELLOW YELLOW   APPearance CLEAR CLEAR   Specific Gravity, Urine 1.013 1.005 - 1.030   pH 5.0 5.0 - 8.0   Glucose, UA NEGATIVE NEGATIVE mg/dL   Hgb urine dipstick NEGATIVE NEGATIVE   Bilirubin Urine NEGATIVE NEGATIVE   Ketones, ur NEGATIVE NEGATIVE mg/dL   Protein, ur 30 (A) NEGATIVE mg/dL   Urobilinogen, UA 0.2 0.0 - 1.0 mg/dL   Nitrite NEGATIVE NEGATIVE   Leukocytes, UA NEGATIVE NEGATIVE  Urine microscopic-add on     Status: None   Collection Time: 05/16/14  8:22 AM  Result Value Ref Range   Squamous Epithelial / LPF RARE RARE   WBC, UA 0-2  <3 WBC/hpf   RBC / HPF 0-2 <3 RBC/hpf   Bacteria, UA  RARE RARE   Urine-Other AMORPHOUS URATES/PHOSPHATES   I-Stat arterial blood gas, ED     Status: Abnormal   Collection Time: 05/16/14  9:29 AM  Result Value Ref Range   pH, Arterial 7.310 (L) 7.350 - 7.450   pCO2 arterial 51.6 (H) 35.0 - 45.0 mmHg   pO2, Arterial 158.0 (H) 80.0 - 100.0 mmHg   Bicarbonate 26.0 (H) 20.0 - 24.0 mEq/L   TCO2 28 0 - 100 mmol/L   O2 Saturation 99.0 %   Acid-base deficit 1.0 0.0 - 2.0 mmol/L   Patient temperature 98.6 F    Collection site RADIAL, ALLEN'S TEST ACCEPTABLE    Drawn by RT    Sample type ARTERIAL   MRSA PCR Screening     Status: None   Collection Time: 05/16/14 10:30 AM  Result Value Ref Range   MRSA by PCR NEGATIVE NEGATIVE    Comment:        The GeneXpert MRSA Assay (FDA approved for NASAL specimens only), is one component of a comprehensive MRSA colonization surveillance program. It is not intended to diagnose MRSA infection nor to guide or monitor treatment for MRSA infections.   Glucose, capillary     Status: Abnormal   Collection Time: 05/16/14 10:37 AM  Result Value Ref Range   Glucose-Capillary 162 (H) 65 - 99 mg/dL  Cortisol     Status: None   Collection Time: 05/16/14 11:27 AM  Result Value Ref Range   Cortisol, Plasma 15.1 ug/dL    Comment: (NOTE) AM    6.7 - 22.6 ug/dL PM   <10.0       ug/dL   Basic metabolic panel     Status: Abnormal   Collection Time: 05/16/14 11:27 AM  Result Value Ref Range   Sodium 140 135 - 145 mmol/L   Potassium >7.5 (HH) 3.5 - 5.1 mmol/L    Comment: REPEATED TO VERIFY CRITICAL RESULT CALLED TO, READ BACK BY AND VERIFIED WITH: J.BEABRAUT,RN [id number] 05/16/14 CLARK,S NO VISIBLE HEMOLYSIS    Chloride 103 101 - 111 mmol/L   CO2 25 22 - 32 mmol/L   Glucose, Bld 215 (H) 65 - 99 mg/dL   BUN 45 (H) 6 - 20 mg/dL   Creatinine, Ser 2.45 (H) 0.61 - 1.24 mg/dL   Calcium 9.8 8.9 - 10.3 mg/dL   GFR calc non Af Amer 27 (L) >60 mL/min   GFR calc Af  Amer 31 (L) >60 mL/min    Comment: (NOTE) The eGFR has been calculated using the CKD EPI equation. This calculation has not been validated in all clinical situations. eGFR's persistently <60 mL/min signify possible Chronic Kidney Disease.    Anion gap 12 5 - 15  Glucose, capillary     Status: Abnormal   Collection Time: 05/16/14  2:04 PM  Result Value Ref Range   Glucose-Capillary 179 (H) 65 - 99 mg/dL     ROS:  Review of systems not obtained due to patient factors.  Physical Exam: Filed Vitals:   05/16/14 1400  BP: 143/67  Pulse: 73  Temp:   Resp: 0     General: obese male- sedated on vent HEENT: PERRLA, EOMI Neck: positive for JVD - vascath left IJ Heart: brady Lungs: decreased BS at bases Abdomen: obese, soft, non tender Extremities: 3 plus pitting edema Skin: warm and dry  Neuro: sedated   Assessment/Plan: 64 year old male with multiple medical issues- DM,HTN,CHF and atrial arrhythmias as well as OSA with home O2 requirement.  Also  with some baseline CKD- he now presents with A on CRF with hypoerkalemia 1.Renal- A on CRF in the setting of hypotension and volume overload- not sure if worsening heart failure came first- leading to decreased perfusion and A on CRF or a primary renal insult causing worsening volume status.  In any event- hemodynamics are better and he is making urine.  If it wasn't for the hyperkalemia we could continue to try and medically treat.  Since is hemodynamically stable and K is so high- will go ahead and try to do an acute treatment to get K down and then since making urine- will try to observe.  U/A is clear which argues for hemodynamic cause 2. Hypertension/volume  - volume overloaded but is responding to lasix.  After doing one short acute HD will continue lasix and observe 3. Hyperkalemia- due to AKI and the fact that he was on potassium supplement as well as mostly a respiratory acidosis which is now resolved.  Is on dopamine for bradycardia  and is better it seems.  Acute HD to bring down acutely then follow 4. Anemia  - variable results- continue to follow    Johan Antonacci A 05/16/2014, 2:19 PM

## 2014-05-16 NOTE — Procedures (Signed)
Patient was seen on dialysis and the procedure was supervised.  BFR 150  Via vascath BP is  145/76.   Patient appears to be tolerating treatment well- acute HD for hyperkalemia   William Flores 05/16/2014

## 2014-05-16 NOTE — Progress Notes (Signed)
RT off floor for procedure, when I returned, I found vent rate set at 35 by MD.  Noted new vent orders for rate of 35.  Pt appears to be tol well so far.  VSS, sat 100%

## 2014-05-16 NOTE — Code Documentation (Signed)
While attempting central line placement, patient became nauseated and began to attempt to vomit after being placed into the supine position.  The bipap was removed and patient was placed on NRB, suctioning was performed while Dr. Jeneen Rinks attempted to find a suitable vein with [place].  67m zofran was administered and the patient continued to desaturate into the low 70's while on the NRB, he decision was made to intubate by Dr. JJeneen Rinksand the patient was moved to trauma c.

## 2014-05-16 NOTE — ED Provider Notes (Signed)
CSN: [id number]     Arrival date & time 05/16/14  0607 History   First MD Initiated Contact with Patient 05/16/14 [phone]     Chief Complaint  Patient presents with  . Respiratory Distress     (Consider location/radiation/quality/duration/timing/severity/associated sxs/prior Treatment) HPI   William Flores is a(n) 64 y.o. male who presents in acute respiratory distress. He has a pmh of DM, colon cancer, HTN, Chronic Afib on warfarin, HLD, CHF, on 02 dependent on 2 L,. Patient admitted and discharged from 5/10-5/20/2015 after admission for acuter resp failure with cardiogenic shock, ARF, hypoxic encephalopathy. Patient BIB EMS for acute Resp distress. History is given by EMS due to patient acuity. It is a level V caveat. The patient was speaking in 3 word sentences on 2 L. He was found to be at 80% on 2 L via nasal cannula. He reports increased weight gain over the past 2 weeks. Patient speaking in 3 word sentences. Found to have diminished air movement with mild expiratory wheeze, diminished lung sounds. Patient presented with hypoxia, hypotension with systolic at 88 and bradycardia. He denies orthopnea or fevers. He denies chest pain.   Past Medical History  Diagnosis Date  . Diabetes   . Colon cancer   . Hypertension   . Sleep apnea   . Atrial fibrillation   . Hyperlipidemia   . CHF (congestive heart failure)   . Colon polyps 09/21/2012    Tubular adenoma   Past Surgical History  Procedure Laterality Date  . Colon resection    . Circumcision    . Colonoscopy N/A 09/21/2012    Procedure: COLONOSCOPY;  Surgeon: Inda Castle, MD;  Location: [facility];  Service: Endoscopy;  Laterality: N/A;  . US echocardiography  03/04/2010    abnormal study   Family History  Problem Relation Age of Onset  . Breast cancer Mother   . Diabetes Mother    History  Substance Use Topics  . Smoking status: Former Smoker    Quit date: 08/06/1983  . Smokeless tobacco: Never Used  . Alcohol Use: No     Review of Systems  Unable to perform ROS: Acuity of condition      Allergies  Codeine  Home Medications   Prior to Admission medications   Medication Sig Start Date End Date Taking? Authorizing Provider  allopurinol (ZYLOPRIM) 100 MG tablet Take 100 mg by mouth daily. 03/29/14   Historical Provider, MD  atorvastatin (LIPITOR) 20 MG tablet Take 1 tablet (20 mg total) by mouth daily at 6 PM. 02/26/14   Janece Canterbury, MD  azelastine (OPTIVAR) 0.05 % ophthalmic solution Place 2 drops into both eyes 2 (two) times daily. 04/19/13   Vernie Shanks, MD  benzonatate (TESSALON) 100 MG capsule TAKE 1 CAPSULE (100 MG TOTAL) BY MOUTH 2 (TWO) TIMES DAILY. 05/07/14   Mary-Margaret Hassell Done, FNP  benzonatate (TESSALON) 100 MG capsule Take 1 capsule (100 mg total) by mouth 2 (two) times daily as needed for cough. 05/13/14   Mary-Margaret Hassell Done, FNP  diltiazem (CARDIZEM CD) 300 MG 24 hr capsule Take 1 capsule (300 mg total) by mouth daily. 04/23/14   Ripudeep Krystal Eaton, MD  ferrous sulfate 325 (65 FE) MG tablet Take 1 tablet (325 mg total) by mouth 2 (two) times daily with a meal. 02/26/14   Janece Canterbury, MD  furosemide (LASIX) 40 MG tablet Take 40 mg by mouth daily. 01/18/14   Historical Provider, MD  glucose blood test strip Use to check BG daily  10/07/13   Tammy Eckard, PHARMD  insulin aspart (NOVOLOG) 100 UNIT/ML injection Inject 0-9 Units into the skin 3 (three) times daily with meals. Sliding scale CBG 70 - 120: 0 units CBG 121 - 150: 1 unit,  CBG 151 - 200: 2 units,  CBG 201 - 250: 3 units,  CBG 251 - 300: 5 units,  CBG 301 - 350: 7 units,  CBG 351 - 400: 9 units   CBG > 400: 9 units and notify your MD 04/23/14   Ripudeep Krystal Eaton, MD  KLOR-CON M20 20 MEQ tablet Take 10 mEq by mouth daily. 03/21/14   Historical Provider, MD  metoprolol tartrate (LOPRESSOR) 25 MG tablet Take 1 tablet (25 mg total) by mouth every 8 (eight) hours. 04/23/14   Ripudeep Krystal Eaton, MD  omeprazole (PRILOSEC) 20 MG capsule TAKE 1 CAPSULE (20  MG TOTAL) BY MOUTH DAILY. 12/17/13   Lysbeth Penner, FNP  [facility] DELICA LANCETS 68L MISC 1 each by Does not apply route daily. Use one lancet to check BG daily 09/13/12   Tammy Eckard, PHARMD  polyvinyl alcohol (LIQUIFILM TEARS) 1.4 % ophthalmic solution Place 1 drop into both eyes as needed for dry eyes. 04/23/14   Ripudeep Krystal Eaton, MD  warfarin (COUMADIN) 10 MG tablet Take 1 tablet (10 mg total) by mouth daily at 6 PM. Patient not taking: Reported on 04/14/2014 02/26/14   Janece Canterbury, MD   BP 102/55 mmHg  Pulse 46  Temp(Src) 97.7 F (36.5 C) (Axillary)  Resp 21  Wt 290 lb (131.543 kg)  SpO2 96% Physical Exam  Constitutional: He is oriented to person, place, and time. He appears well-developed and well-nourished. He appears distressed.  HENT:  Head: Normocephalic and atraumatic.  Eyes: Conjunctivae are normal.  Neck:  No JVD seen  Cardiovascular:  Bradycardic  Pulmonary/Chest: He is in respiratory distress.  Poor air movement, Decreased breath sounds. Minimal exp wheeze.  Abdominal: He exhibits distension. There is no tenderness. There is no guarding.  Obese, distended abdomen with large well-healed surgical scar, large umbilical hernia. Erythema from the pubis up to the umbilicus. Nontender.  Musculoskeletal: He exhibits edema.  3+ pitting edema to the knee, venous stasis, tattooing  Neurological: He is alert and oriented to person, place, and time.  Nursing note and vitals reviewed.   ED Course  Procedures (including critical care time) Labs Review Labs Reviewed - No data to display  Imaging Review No results found.   EKG Interpretation None      CRITICAL CARE Performed by: Margarita Mail   Total critical care time: 120   Critical care time was exclusive of separately billable procedures and treating other patients.  Critical care was necessary to treat or prevent imminent or life-threatening deterioration.  Critical care was time spent personally by me  on the following activities: development of treatment plan with patient and/or surrogate as well as nursing, discussions with consultants, evaluation of patient's response to treatment, examination of patient, obtaining history from patient or surrogate, ordering and performing treatments and interventions, ordering and review of laboratory studies, ordering and review of radiographic studies, pulse oximetry and re-evaluation of patient's condition.   MDM   Final diagnoses:  None    Patient presents with acute respiratory distress. Initially placed on 10 L of oxygen via an NRB with improved oxygen saturations above 90%. Patient will be placed on BiPAP. He is hypotensive and bradycardic. Initial EKG shows likely ventricular escape rhythm with bradycardia vs RVR with Bradycardia. Chest  x-ray shows diffuse pulmonary edema with bilateral pleural effusions. Management is difficult due to the patient's hypotension and need for diuresis,.  Critical care is aware and will see patient.   Patient potassium found to be elevated, given 10% clacium chloride IVP  AND 1 amp bicarb. Continued hypotension with systolic in the 50V on bipap at 85% Attempted central line placement,.  Patient became nausea and bipap was removed, He desaturated into th 60s and was moved to the trauma bay.    patient successfully intubated. Given 20 of etomidate and 150 of succ with propofol drip. He continued to gag and 4 of versed and 50 of rocuronium Patient's systolic pressure increase to 130. However HR slowed ranging between 30s-40s. Central line 3 times in the neck, and femoral vein.  BP 84/40 mmHg  Pulse 46  Temp(Src) 97.7 F (36.5 C) (Axillary)  Resp 18  Wt 290 lb (131.543 kg)  SpO2 92%  4th attempt, central line placedin the Righ IJ. Dopamine given. Patient stabilized I personally reviewed the imaging tests through PACS system. I have reviewed and interpreted Lab values. I reviewed available  ER/hospitalization records through the EMR    Margarita Mail, PA-C 05/16/14 [place], MD 05/19/14 [phone]

## 2014-05-16 NOTE — ED Notes (Signed)
MD at bedside. 

## 2014-05-16 NOTE — Progress Notes (Signed)
Initial Nutrition Assessment  DOCUMENTATION CODES:  Morbid obesity  INTERVENTION:  Tube feeding, Prostat   Initiate TF via OGT with Vital High Protein at 25 ml/h and Prostat 30 ml QID on day 1; on day 2, increase to goal rate of 50 ml/h (1200 ml per day) to provide 1600 kcals, 165 gm protein, 1003 ml free water daily.  Total intake from TF + Propofol will be 1703 kcals per day.   NUTRITION DIAGNOSIS:  Inadequate oral intake related to inability to eat as evidenced by NPO status.   GOAL:  Provide needs based on ASPEN/SCCM guidelines   MONITOR:  TF tolerance, Vent status, Weight trends, Labs  REASON FOR ASSESSMENT:  Consult Enteral/tube feeding initiation and management  ASSESSMENT:  Patient presented to the [name] ED on 5/13 with SOB, hypotension, and bradycardia. Failed BiPAP and required intubation in the ED. Admitted with hypoxic respiratory failure.  Labs reviewed. Potassium, BUN, and creatinine elevated.  Discussed patient in ICU rounds and with RN today. Received MD Consult for TF initiation and management. Nutrition focused physical exam completed.  No muscle or subcutaneous fat depletion noticed.  Patient is currently intubated on ventilator support MV: 10.3 L/min Temp (24hrs), Avg:97.6 F (36.4 C), Min:97 F (36.1 C), Max:98.2 F (36.8 C)  Propofol: 3.9 ml/hr providing 103 kcals per day.  Height:  Ht Readings from Last 1 Encounters:  05/16/14 5' 7"  (1.702 m)    Weight:  Wt Readings from Last 1 Encounters:  05/16/14 290 lb (131.543 kg)    Ideal Body Weight:  67.3 kg  Wt Readings from Last 10 Encounters:  05/16/14 290 lb (131.543 kg)  04/23/14 282 lb 13.6 oz (128.3 kg)  03/20/14 268 lb (121.564 kg)  03/19/14 266 lb (120.657 kg)  02/26/14 261 lb 14.4 oz (118.797 kg)  02/10/14 294 lb 12.8 oz (133.72 kg)  12/19/13 284 lb (128.822 kg)  11/29/13 282 lb (127.914 kg)  11/21/13 273 lb 14.4 oz (124.24 kg)  11/19/13 273 lb 6.4 oz (124.013 kg)     BMI:  Body mass index is 45.41 kg/(m^2).  Estimated Nutritional Needs:  Kcal:  2585-2778  Protein:  168 gm  Fluid:  2 L  Skin:  Reviewed, no issues  Diet Order:  Diet NPO time specified  EDUCATION NEEDS:  Education needs no appropriate at this time   Intake/Output Summary (Last 24 hours) at 05/16/14 1216 Last data filed at 05/16/14 1200  Gross per 24 hour  Intake 143.27 ml  Output    650 ml  Net -506.73 ml    Last BM:  unknown   Molli Barrows, RD, LDN, [place] Pager [phone] After Hours Pager [phone]

## 2014-05-16 NOTE — Progress Notes (Signed)
Increased fio2 to 70% per fio2/peep guidelines on ARDS protocol and per Dr Alva Garnet to keep sat 92% or above.

## 2014-05-16 NOTE — H&P (Signed)
PULMONARY / CRITICAL CARE MEDICINE   Name: William Flores MRN: [id number] DOB: 08-22-1950    ADMISSION DATE:  05/16/2014 CONSULTATION DATE:  05/16/14  REFERRING MD :  Dr. Jeneen Rinks   CHIEF COMPLAINT:  Respiratory Failure   INITIAL PRESENTATION: 64 y/o M who presented to [facility] ER on 5/13 with SOB, hypotension and bradycardia.  He failed bipap and required intubation per EDP.  Initial work up consistent with hypoxic respiratory failure   STUDIES:  5/13  CXR >> R effusion, pattern c/w pulmonary edema   SIGNIFICANT EVENTS: 5/13  Admit with SOB, hypotension & bradycardia    HISTORY OF PRESENT ILLNESS:   64 y/o M with a past medical history of DM, HTN, HLD, chronic diastolic CHF, atrial fibrillation on Coumadin, colon cancer s/p resection, obstructive sleep apnea, 2 L oxygen dependent, renal failure, prior history of NICM (EF 35-40%, now up to 60-65% in 04/2014) and multiple recent admissions for hypoxic respiratory failure who presented to the [facility] ER on 5/13 with complaints of increasing shortness of breath.   He was found to be hypotensive and bradycardic with room air saturations of 80%. Patient was placed on BiPAP for respiratory distress. He was laid flat for central line placement and the patient became nauseated with dry heaves and the BiPAP was removed. He was treated with Zofran. The patient desaturated into the 70s while on a nonrebreather and decision was made to intubate. He was intubated by the EDP with a 7.5 ETT. The setting of hypotension and bradycardia he was placed on a dopamine drip. Chest x-ray evaluation showed pulmonary edema with right > left pleural effusions. ABG 7.24 / 59 / 80 / 26.  Labs notable for acute on chronic renal failure with a serum creatinine of 2.5 and hyperkalemia with a K of 7.3. EKG assessment was consistent with atrial fibrillation with a slow ventricular response. Central line was placed in the emergency room. PCCM called for ICU admission.    PAST MEDICAL  HISTORY :   has a past medical history of Diabetes; Colon cancer; Hypertension; Sleep apnea; Atrial fibrillation; Hyperlipidemia; CHF (congestive heart failure); and Colon polyps (09/21/2012).  has past surgical history that includes Colon resection; Circumcision; Colonoscopy (N/A, 09/21/2012); and US echocardiography (03/04/2010).   Prior to Admission medications   Medication Sig Start Date End Date Taking? Authorizing Provider  allopurinol (ZYLOPRIM) 100 MG tablet Take 100 mg by mouth daily. 03/29/14   Historical Provider, MD  atorvastatin (LIPITOR) 20 MG tablet Take 1 tablet (20 mg total) by mouth daily at 6 PM. 02/26/14   Janece Canterbury, MD  azelastine (OPTIVAR) 0.05 % ophthalmic solution Place 2 drops into both eyes 2 (two) times daily. 04/19/13   Vernie Shanks, MD  benzonatate (TESSALON) 100 MG capsule TAKE 1 CAPSULE (100 MG TOTAL) BY MOUTH 2 (TWO) TIMES DAILY. 05/07/14   Mary-Margaret Hassell Done, FNP  benzonatate (TESSALON) 100 MG capsule Take 1 capsule (100 mg total) by mouth 2 (two) times daily as needed for cough. 05/13/14   Mary-Margaret Hassell Done, FNP  diltiazem (CARDIZEM CD) 300 MG 24 hr capsule Take 1 capsule (300 mg total) by mouth daily. 04/23/14   Ripudeep Krystal Eaton, MD  ferrous sulfate 325 (65 FE) MG tablet Take 1 tablet (325 mg total) by mouth 2 (two) times daily with a meal. 02/26/14   Janece Canterbury, MD  furosemide (LASIX) 40 MG tablet Take 40 mg by mouth daily. 01/18/14   Historical Provider, MD  glucose blood test strip Use to  check BG daily 10/07/13   Tammy Eckard, PHARMD  insulin aspart (NOVOLOG) 100 UNIT/ML injection Inject 0-9 Units into the skin 3 (three) times daily with meals. Sliding scale CBG 70 - 120: 0 units CBG 121 - 150: 1 unit,  CBG 151 - 200: 2 units,  CBG 201 - 250: 3 units,  CBG 251 - 300: 5 units,  CBG 301 - 350: 7 units,  CBG 351 - 400: 9 units   CBG > 400: 9 units and notify your MD 04/23/14   Ripudeep Krystal Eaton, MD  KLOR-CON M20 20 MEQ tablet Take 10 mEq by mouth daily. 03/21/14    Historical Provider, MD  metoprolol tartrate (LOPRESSOR) 25 MG tablet Take 1 tablet (25 mg total) by mouth every 8 (eight) hours. 04/23/14   Ripudeep Krystal Eaton, MD  omeprazole (PRILOSEC) 20 MG capsule TAKE 1 CAPSULE (20 MG TOTAL) BY MOUTH DAILY. 12/17/13   Lysbeth Penner, FNP  [facility] DELICA LANCETS 16X MISC 1 each by Does not apply route daily. Use one lancet to check BG daily 09/13/12   Tammy Eckard, PHARMD  polyvinyl alcohol (LIQUIFILM TEARS) 1.4 % ophthalmic solution Place 1 drop into both eyes as needed for dry eyes. 04/23/14   Ripudeep Krystal Eaton, MD  warfarin (COUMADIN) 10 MG tablet Take 1 tablet (10 mg total) by mouth daily at 6 PM. Patient not taking: Reported on 04/14/2014 02/26/14   Janece Canterbury, MD   Allergies  Allergen Reactions  . Codeine     Headache     FAMILY HISTORY:  has no family status information on file.    SOCIAL HISTORY:  reports that he quit smoking about 30 years ago. He has never used smokeless tobacco. He reports that he does not drink alcohol or use illicit drugs.  REVIEW OF SYSTEMS:  Unable to complete as patient is on mechanical ventilation. No family at bedside. Information obtained from staff and prior medical documentation.    SUBJECTIVE:   VITAL SIGNS: Temp:  [97 F (36.1 C)-97.7 F (36.5 C)] 97 F (36.1 C) (05/13 0830) Pulse Rate:  [43-83] 70 (05/13 0830) Resp:  [16-38] 26 (05/13 0830) BP: (82-131)/(40-62) 116/53 mmHg (05/13 0830) SpO2:  [68 %-99 %] 97 % (05/13 0830) FiO2 (%):  [60 %-100 %] 100 % (05/13 0739) Weight:  [290 lb (131.543 kg)] 290 lb (131.543 kg) (05/13 0612)   HEMODYNAMICS:     VENTILATOR SETTINGS: Vent Mode:  [-] PRVC FiO2 (%):  [60 %-100 %] 100 % Set Rate:  [16 bmp] 16 bmp Vt Set:  [580 mL] 580 mL PEEP:  [15 cmH20] 15 cmH20 Plateau Pressure:  [31 cmH20] 31 cmH20   INTAKE / OUTPUT:  Intake/Output Summary (Last 24 hours) at 05/16/14 0960 Last data filed at 05/16/14 4540  Gross per 24 hour  Intake      0 ml  Output     500 ml  Net   -500 ml    PHYSICAL EXAMINATION: General:  Obese male on vent in no acute distress Neuro:  Sedate HEENT:  OETT, MM moist, short / thick neck  Cardiovascular:  s1s2 irr irr, slow fib on monitor  Lungs:  resp's even/non-labored on vent, lungs bilaterally diminished  Abdomen:  Obese/soft, bsx4 active Musculoskeletal:  No acute deformities  Skin:  Warm/dry, 2-3+ pitting pre-tibial edema, long toenails /uncared for appearance   LABS:  CBC  Recent Labs Lab 05/16/14 0618 05/16/14 0640  WBC 8.3  --   HGB 9.6* 12.2*  HCT 35.0*  36.0*  PLT 178  --    Coag's  Recent Labs Lab 05/16/14 0618  INR 1.74*   BMET  Recent Labs Lab 05/16/14 0618 05/16/14 0640  NA 138 138  K 7.4* 7.3*  CL 104 105  CO2 23  --   BUN 43* 46*  CREATININE 2.35* 2.50*  GLUCOSE 181* 186*   Electrolytes  Recent Labs Lab 05/16/14 0618  CALCIUM 9.0   Sepsis Markers No results for input(s): LATICACIDVEN, PROCALCITON, O2SATVEN in the last 168 hours.   ABG  Recent Labs Lab 05/16/14 0640  PHART 7.246*  PCO2ART 58.8*  PO2ART 80.0   Liver Enzymes No results for input(s): AST, ALT, ALKPHOS, BILITOT, ALBUMIN in the last 168 hours.   Cardiac Enzymes No results for input(s): TROPONINI, PROBNP in the last 168 hours.   Glucose No results for input(s): GLUCAP in the last 168 hours.  Imaging No results found.   ASSESSMENT / PLAN:  PULMONARY OETT 5/13 >>  A: Acute Hypoxic Respiratory Failure - in the setting of pulmonary edema as evidenced by respiratory acidosis, hypercarbia Pulmonary Edema  OSA - on CPAP  Pleural Effusions - bilateral, R>L P:   MV support, adjust flow trigger F/U CXR to confirm ETT / TLC placement  Wean PEEP / FiO2 to support sats > 90% Daily SBT / WUA Consider [place] assessment of pleural space if does not clear on CXR with lasix infusion  Will need CPAP post extubation QHS See CV  Q4 PRN Albuterol   CARDIOVASCULAR CVL R IJ TLC 5/13 (ER) >>  A:   dCHF - ? If untreated OSA (non-compliance) is leading to multiple re-admits for decompensation  Atrial Fibrillation  Bradycardia  Hypotension  P:  Levophed gtt to maintain MAP > 60 Dopamine gtt @ 5-7 mcg in setting of bradycardia/hypotension Lasix gtt @ 8 mg/hr Assess cortisol - multiple recent admits, hypotension on arrival  Cardiology consulted, appreciate input   RENAL A:   Acute on Chronic Renal Failure  Hyperkalemia  P:   Lasix gtt as above Trend BMP Q4 while on lasix gtt Monitor K Lactulose x 1 in ER + 80 mg lasix Replace electrolytes as indicated  F/u Mg/Phos in am  Hold nephrology consult for now  GASTROINTESTINAL A:   Nausea Morbid Obesity  P:   PRN zofran TF per ICU protocol  PRN bowel regimen with narcotics  HEMATOLOGIC A:   Anemia  P:  Trend CBC Heparin for DVT prophylaxis   INFECTIOUS A:   No overt concerns for infection  P:   Monitor fever curve / WBC  ENDOCRINE A:   Hyperglycemia  P:   Monitor glucose on BMP if consistently > 180, add SSI   NEUROLOGIC A:   Acute Metabolic Encephalopathy  P:   RASS goal: -1 Fentanyl gtt for pain  PRN versed for sedation  Early PT    FAMILY  - Updates: No family at bedside.     GLOBAL:  We will need to consider maximum home health support vs placement given multiple recent admissions.  ? Adherence to medication regimen and CPAP.   Noe Gens, NP-C [facility] Pgr: [phone] or [phone] 05/16/2014, 9:03 AM   STAFF NOTE: I, Merrie Roof, MD FACP have personally reviewed patient's available data, including medical history, events of note, physical examination and test results as part of my evaluation. I have discussed with resident/NP and other care providers such as pharmacist, RN and RRT. In addition, I personally evaluated patient  and elicited key findings of: sedated, hypoxic, on vent, pcxr c/w pulm edema, r/o effusion rt, not impressed for infection, with  hemodynamics, would start dop to Beta affect no greater than 8 mics, add levophed if needed for further MAP support, lasix drip to neg balance, repeat k frequent after aggressive treatment, correct PH asap to correct K, may need renal consult, follow urine output and K on labs, d/w cards at bedside, for echo, follow cvp, 4 plus edema on examination The patient is critically ill with multiple organ systems failure and requires high complexity decision making for assessment and support, frequent evaluation and titration of therapies, application of advanced monitoring technologies and extensive interpretation of multiple databases.   Critical Care Time devoted to patient care services described in this note is40 Minutes. This time reflects time of care of this signee: Merrie Roof, MD FACP. This critical care time does not reflect procedure time, or teaching time or supervisory time of PA/NP/Med student/Med Resident etc but could involve care discussion time. Rest per NP/medical resident whose note is outlined above and that I agree with   [name]. [name], MD, Fellows Pgr: [facility] 05/16/2014 11:24 AM

## 2014-05-16 NOTE — ED Notes (Signed)
EMS gave 125 mg solumedrol, 2.5 albuterol en route.  Patient on 2 liters [place] at all times at home with 30 foot oxygen tubing.

## 2014-05-17 ENCOUNTER — Inpatient Hospital Stay (HOSPITAL_COMMUNITY): Payer: BLUE CROSS/BLUE SHIELD

## 2014-05-17 LAB — CBC
HEMATOCRIT: 32.4 % — AB (ref 39.0–52.0)
Hemoglobin: 9.7 g/dL — ABNORMAL LOW (ref 13.0–17.0)
MCH: 25.9 pg — AB (ref 26.0–34.0)
MCHC: 29.9 g/dL — ABNORMAL LOW (ref 30.0–36.0)
MCV: 86.4 fL (ref 78.0–100.0)
Platelets: 174 10*3/uL (ref 150–400)
RBC: 3.75 MIL/uL — ABNORMAL LOW (ref 4.22–5.81)
RDW: 20.7 % — ABNORMAL HIGH (ref 11.5–15.5)
WBC: 6.2 10*3/uL (ref 4.0–10.5)

## 2014-05-17 LAB — BASIC METABOLIC PANEL
ANION GAP: 10 (ref 5–15)
ANION GAP: 10 (ref 5–15)
Anion gap: 10 (ref 5–15)
Anion gap: 10 (ref 5–15)
Anion gap: 12 (ref 5–15)
BUN: 38 mg/dL — ABNORMAL HIGH (ref 6–20)
BUN: 37 mg/dL — AB (ref 6–20)
BUN: 39 mg/dL — ABNORMAL HIGH (ref 6–20)
BUN: 41 mg/dL — AB (ref 6–20)
BUN: 42 mg/dL — AB (ref 6–20)
CALCIUM: 9.3 mg/dL (ref 8.9–10.3)
CALCIUM: 9.3 mg/dL (ref 8.9–10.3)
CO2: 31 mmol/L (ref 22–32)
CO2: 33 mmol/L — ABNORMAL HIGH (ref 22–32)
CO2: 33 mmol/L — ABNORMAL HIGH (ref 22–32)
CO2: 37 mmol/L — ABNORMAL HIGH (ref 22–32)
CO2: 38 mmol/L — ABNORMAL HIGH (ref 22–32)
CREATININE: 1.88 mg/dL — AB (ref 0.61–1.24)
CREATININE: 1.59 mg/dL — AB (ref 0.61–1.24)
Calcium: 9 mg/dL (ref 8.9–10.3)
Calcium: 9 mg/dL (ref 8.9–10.3)
Calcium: 8.8 mg/dL — ABNORMAL LOW (ref 8.9–10.3)
Chloride: 94 mmol/L — ABNORMAL LOW (ref 101–111)
Chloride: 96 mmol/L — ABNORMAL LOW (ref 101–111)
Chloride: 96 mmol/L — ABNORMAL LOW (ref 101–111)
Chloride: 97 mmol/L — ABNORMAL LOW (ref 101–111)
Chloride: 98 mmol/L — ABNORMAL LOW (ref 101–111)
Creatinine, Ser: 1.86 mg/dL — ABNORMAL HIGH (ref 0.61–1.24)
Creatinine, Ser: 1.93 mg/dL — ABNORMAL HIGH (ref 0.61–1.24)
Creatinine, Ser: 1.98 mg/dL — ABNORMAL HIGH (ref 0.61–1.24)
GFR calc Af Amer: 43 mL/min — ABNORMAL LOW (ref >60)
GFR calc non Af Amer: 34 mL/min — ABNORMAL LOW (ref >60)
GFR calc non Af Amer: 36 mL/min — ABNORMAL LOW (ref >60)
GFR calc non Af Amer: 37 mL/min — ABNORMAL LOW (ref >60)
GFR, EST AFRICAN AMERICAN: 41 mL/min — AB (ref >60)
GFR, EST AFRICAN AMERICAN: 52 mL/min — AB (ref >60)
GFR, EST AFRICAN AMERICAN: 43 mL/min — AB (ref >60)
GFR, EST AFRICAN AMERICAN: 40 mL/min — AB (ref >60)
GFR, EST NON AFRICAN AMERICAN: 37 mL/min — AB (ref >60)
GFR, EST NON AFRICAN AMERICAN: 45 mL/min — AB (ref >60)
GLUCOSE: 226 mg/dL — AB (ref 65–99)
GLUCOSE: 117 mg/dL — AB (ref 65–99)
Glucose, Bld: 116 mg/dL — ABNORMAL HIGH (ref 65–99)
Glucose, Bld: 211 mg/dL — ABNORMAL HIGH (ref 65–99)
Glucose, Bld: 256 mg/dL — ABNORMAL HIGH (ref 65–99)
POTASSIUM: 4.2 mmol/L (ref 3.5–5.1)
POTASSIUM: 4.7 mmol/L (ref 3.5–5.1)
Potassium: 4.5 mmol/L (ref 3.5–5.1)
Potassium: 4.9 mmol/L (ref 3.5–5.1)
Potassium: 5 mmol/L (ref 3.5–5.1)
SODIUM: 143 mmol/L (ref 135–145)
Sodium: 139 mmol/L (ref 135–145)
Sodium: 140 mmol/L (ref 135–145)
Sodium: 141 mmol/L (ref 135–145)
Sodium: 142 mmol/L (ref 135–145)

## 2014-05-17 LAB — HEPATITIS B CORE ANTIBODY, TOTAL: HEP B C TOTAL AB: NEGATIVE

## 2014-05-17 LAB — GLUCOSE, CAPILLARY
GLUCOSE-CAPILLARY: 196 mg/dL — AB (ref 65–99)
GLUCOSE-CAPILLARY: 96 mg/dL (ref 65–99)
GLUCOSE-CAPILLARY: 94 mg/dL (ref 65–99)
Glucose-Capillary: 158 mg/dL — ABNORMAL HIGH (ref 65–99)
Glucose-Capillary: 193 mg/dL — ABNORMAL HIGH (ref 65–99)
Glucose-Capillary: 200 mg/dL — ABNORMAL HIGH (ref 65–99)

## 2014-05-17 LAB — MAGNESIUM: MAGNESIUM: 1.5 mg/dL — AB (ref 1.7–2.4)

## 2014-05-17 LAB — BRAIN NATRIURETIC PEPTIDE: B NATRIURETIC PEPTIDE 5: 320.6 pg/mL — AB (ref 0.0–100.0)

## 2014-05-17 LAB — PHOSPHORUS: Phosphorus: 3.1 mg/dL (ref 2.5–4.6)

## 2014-05-17 MED ORDER — WARFARIN SODIUM 10 MG PO TABS
10.0000 mg | ORAL_TABLET | Freq: Once | ORAL | Status: AC
  Administered 2014-05-17: 10 mg via ORAL
  Filled 2014-05-17: qty 1

## 2014-05-17 MED ORDER — WARFARIN - PHARMACIST DOSING INPATIENT
Freq: Every day | Status: DC
  Administered 2014-05-17 – 2014-05-19 (×3)

## 2014-05-17 MED ORDER — CETYLPYRIDINIUM CHLORIDE 0.05 % MT LIQD
7.0000 mL | Freq: Two times a day (BID) | OROMUCOSAL | Status: DC
  Administered 2014-05-17 – 2014-05-21 (×5): 7 mL via OROMUCOSAL

## 2014-05-17 MED ORDER — INSULIN ASPART 100 UNIT/ML ~~LOC~~ SOLN
2.0000 [IU] | SUBCUTANEOUS | Status: DC
  Administered 2014-05-17 (×2): 4 [IU] via SUBCUTANEOUS

## 2014-05-17 MED ORDER — FENTANYL CITRATE (PF) 100 MCG/2ML IJ SOLN: 100.0000 ug | INTRAMUSCULAR | Status: DC | PRN

## 2014-05-17 MED ORDER — FENTANYL CITRATE (PF) 100 MCG/2ML IJ SOLN
100.0000 ug | INTRAMUSCULAR | Status: DC | PRN
  Administered 2014-05-17: 100 ug via INTRAVENOUS
  Filled 2014-05-17: qty 2

## 2014-05-17 MED ORDER — CHLORHEXIDINE GLUCONATE 0.12 % MT SOLN: 15.0000 mL | Freq: Two times a day (BID) | OROMUCOSAL | Status: DC

## 2014-05-17 NOTE — Progress Notes (Signed)
SUBJECTIVE:  He is intubated but awake.  Denies any pain.     PHYSICAL EXAM Filed Vitals:   05/17/14 0700 05/17/14 0730 05/17/14 0800 05/17/14 0801  BP: 101/59 122/57 117/60   Pulse: 83 97 98   Temp:    97.7 F (36.5 C)  TempSrc:    Oral  Resp:      Height:      Weight:      SpO2: 100% 100% 100%    General:  No distress Lungs:  Clear Heart:  Irregular Abdomen:  Positive bowel sounds, no rebound no guarding Extremities:  Trace edema Neuro:   Nonfocal.   LABS:  Results for orders placed or performed during the hospital encounter of 05/16/14 (from the past 24 hour(s))  MRSA PCR Screening     Status: None   Collection Time: 05/16/14 10:30 AM  Result Value Ref Range   MRSA by PCR NEGATIVE NEGATIVE  Glucose, capillary     Status: Abnormal   Collection Time: 05/16/14 10:37 AM  Result Value Ref Range   Glucose-Capillary 162 (H) 65 - 99 mg/dL  Cortisol     Status: None   Collection Time: 05/16/14 11:27 AM  Result Value Ref Range   Cortisol, Plasma 15.1 ug/dL  Basic metabolic panel     Status: Abnormal   Collection Time: 05/16/14 11:27 AM  Result Value Ref Range   Sodium 140 135 - 145 mmol/L   Potassium >7.5 (HH) 3.5 - 5.1 mmol/L   Chloride 103 101 - 111 mmol/L   CO2 25 22 - 32 mmol/L   Glucose, Bld 215 (H) 65 - 99 mg/dL   BUN 45 (H) 6 - 20 mg/dL   Creatinine, Ser 2.45 (H) 0.61 - 1.24 mg/dL   Calcium 9.8 8.9 - 10.3 mg/dL   GFR calc non Af Amer 27 (L) >60 mL/min   GFR calc Af Amer 31 (L) >60 mL/min   Anion gap 12 5 - 15  Basic metabolic panel     Status: Abnormal   Collection Time: 05/16/14  2:00 PM  Result Value Ref Range   Sodium 140 135 - 145 mmol/L   Potassium 6.5 (HH) 3.5 - 5.1 mmol/L   Chloride 107 101 - 111 mmol/L   CO2 25 22 - 32 mmol/L   Glucose, Bld 243 (H) 65 - 99 mg/dL   BUN 42 (H) 6 - 20 mg/dL   Creatinine, Ser 2.13 (H) 0.61 - 1.24 mg/dL   Calcium 8.7 (L) 8.9 - 10.3 mg/dL   GFR calc non Af Amer 32 (L) >60 mL/min   GFR calc Af Amer 37 (L) >60  mL/min   Anion gap 8 5 - 15  Glucose, capillary     Status: Abnormal   Collection Time: 05/16/14  2:04 PM  Result Value Ref Range   Glucose-Capillary 179 (H) 65 - 99 mg/dL  Renal function panel (daily at 1600)     Status: Abnormal   Collection Time: 05/16/14  3:50 PM  Result Value Ref Range   Sodium 141 135 - 145 mmol/L   Potassium 6.8 (HH) 3.5 - 5.1 mmol/L   Chloride 103 101 - 111 mmol/L   CO2 29 22 - 32 mmol/L   Glucose, Bld 242 (H) 65 - 99 mg/dL   BUN 40 (H) 6 - 20 mg/dL   Creatinine, Ser 2.14 (H) 0.61 - 1.24 mg/dL   Calcium 9.8 8.9 - 10.3 mg/dL   Phosphorus 2.8 2.5 - 4.6 mg/dL   Albumin  3.3 (L) 3.5 - 5.0 g/dL   GFR calc non Af Amer 31 (L) >60 mL/min   GFR calc Af Amer 36 (L) >60 mL/min   Anion gap 9 5 - 15  Glucose, capillary     Status: Abnormal   Collection Time: 05/16/14  3:53 PM  Result Value Ref Range   Glucose-Capillary 143 (H) 65 - 99 mg/dL  Hepatitis B surface antigen     Status: None   Collection Time: 05/16/14  4:00 PM  Result Value Ref Range   Hepatitis B Surface Ag NEGATIVE NEGATIVE  Hepatitis B core antibody, total     Status: None   Collection Time: 05/16/14  4:00 PM  Result Value Ref Range   Hep B Core Total Ab Negative Negative  Hepatitis B surface antibody     Status: None   Collection Time: 05/16/14  4:00 PM  Result Value Ref Range   Hep B S Ab NEGATIVE NEGATIVE  Basic metabolic panel     Status: Abnormal   Collection Time: 05/16/14  7:45 PM  Result Value Ref Range   Sodium 140 135 - 145 mmol/L   Potassium 5.6 (H) 3.5 - 5.1 mmol/L   Chloride 100 (L) 101 - 111 mmol/L   CO2 30 22 - 32 mmol/L   Glucose, Bld 258 (H) 65 - 99 mg/dL   BUN 35 (H) 6 - 20 mg/dL   Creatinine, Ser 1.97 (H) 0.61 - 1.24 mg/dL   Calcium 9.6 8.9 - 10.3 mg/dL   GFR calc non Af Amer 35 (L) >60 mL/min   GFR calc Af Amer 40 (L) >60 mL/min   Anion gap 10 5 - 15  Glucose, capillary     Status: Abnormal   Collection Time: 05/16/14  7:54 PM  Result Value Ref Range    Glucose-Capillary 228 (H) 65 - 99 mg/dL  Basic metabolic panel     Status: Abnormal   Collection Time: 05/16/14 11:45 PM  Result Value Ref Range   Sodium 139 135 - 145 mmol/L   Potassium 5.0 3.5 - 5.1 mmol/L   Chloride 98 (L) 101 - 111 mmol/L   CO2 31 22 - 32 mmol/L   Glucose, Bld 256 (H) 65 - 99 mg/dL   BUN 37 (H) 6 - 20 mg/dL   Creatinine, Ser 1.93 (H) 0.61 - 1.24 mg/dL   Calcium 9.3 8.9 - 10.3 mg/dL   GFR calc non Af Amer 36 (L) >60 mL/min   GFR calc Af Amer 41 (L) >60 mL/min   Anion gap 10 5 - 15  Glucose, capillary     Status: Abnormal   Collection Time: 05/16/14 11:51 PM  Result Value Ref Range   Glucose-Capillary 200 (H) 65 - 99 mg/dL  Basic metabolic panel     Status: Abnormal   Collection Time: 05/17/14  2:45 AM  Result Value Ref Range   Sodium 141 135 - 145 mmol/L   Potassium 4.9 3.5 - 5.1 mmol/L   Chloride 96 (L) 101 - 111 mmol/L   CO2 33 (H) 22 - 32 mmol/L   Glucose, Bld 226 (H) 65 - 99 mg/dL   BUN 38 (H) 6 - 20 mg/dL   Creatinine, Ser 1.88 (H) 0.61 - 1.24 mg/dL   Calcium 9.3 8.9 - 10.3 mg/dL   GFR calc non Af Amer 37 (L) >60 mL/min   GFR calc Af Amer 43 (L) >60 mL/min   Anion gap 12 5 - 15  Magnesium     Status:  Abnormal   Collection Time: 05/17/14  2:45 AM  Result Value Ref Range   Magnesium 1.5 (L) 1.7 - 2.4 mg/dL  Phosphorus     Status: None   Collection Time: 05/17/14  2:45 AM  Result Value Ref Range   Phosphorus 3.1 2.5 - 4.6 mg/dL  Brain natriuretic peptide     Status: Abnormal   Collection Time: 05/17/14  4:57 AM  Result Value Ref Range   B Natriuretic Peptide 320.6 (H) 0.0 - 100.0 pg/mL  CBC     Status: Abnormal   Collection Time: 05/17/14  5:00 AM  Result Value Ref Range   WBC 6.2 4.0 - 10.5 K/uL   RBC 3.75 (L) 4.22 - 5.81 MIL/uL   Hemoglobin 9.7 (L) 13.0 - 17.0 g/dL   HCT 32.4 (L) 39.0 - 52.0 %   MCV 86.4 78.0 - 100.0 fL   MCH 25.9 (L) 26.0 - 34.0 pg   MCHC 29.9 (L) 30.0 - 36.0 g/dL   RDW 20.7 (H) 11.5 - 15.5 %   Platelets 174 150 - 400  K/uL  Basic metabolic panel     Status: Abnormal   Collection Time: 05/17/14  7:00 AM  Result Value Ref Range   Sodium 140 135 - 145 mmol/L   Potassium 4.2 3.5 - 5.1 mmol/L   Chloride 97 (L) 101 - 111 mmol/L   CO2 33 (H) 22 - 32 mmol/L   Glucose, Bld 211 (H) 65 - 99 mg/dL   BUN 39 (H) 6 - 20 mg/dL   Creatinine, Ser 1.98 (H) 0.61 - 1.24 mg/dL   Calcium 9.0 8.9 - 10.3 mg/dL   GFR calc non Af Amer 34 (L) >60 mL/min   GFR calc Af Amer 40 (L) >60 mL/min   Anion gap 10 5 - 15  Glucose, capillary     Status: Abnormal   Collection Time: 05/17/14  7:58 AM  Result Value Ref Range   Glucose-Capillary 193 (H) 65 - 99 mg/dL    Intake/Output Summary (Last 24 hours) at 05/17/14 1006 Last data filed at 05/17/14 0900  Gross per 24 hour  Intake 4775.11 ml  Output   9900 ml  Net -5124.89 ml    ASSESSMENT AND PLAN:  Acute hypoxic respiratory failure:  Vent management per CCM.  Looks like he will be extubated today.   He has had excellent diuresis and Lasix was stopped this morning.    Acute on chronic renal failure:  One dialysis session for hyperkalemia.  Creat is stable   Atrial fib: Restart warfarin.  HR is up a little currently.  However, hopefully his BP will allow [place] to restart calcium channel blocker back today or tomorrow and then eventually beta blocker.   Hypotension:  No evidence of cardiogenic shock.  His BP is improved off of pressors.      Jeneen Rinks [facility] 05/17/2014 10:06 AM

## 2014-05-17 NOTE — Progress Notes (Signed)
Wasted 24m IV fentanyl into sink with LCruzita Lederer

## 2014-05-17 NOTE — Progress Notes (Signed)
PULMONARY / CRITICAL CARE MEDICINE   Name: BARACK NICODEMUS MRN: [id number] DOB: October 09, 1950    ADMISSION DATE:  05/16/2014 CONSULTATION DATE:  05/16/14  REFERRING MD :  Dr. Jeneen Rinks   CHIEF COMPLAINT:  Respiratory Failure   INITIAL PRESENTATION: 64 y/o M who presented to [facility] ER on 5/13 with SOB, hypotension and bradycardia.  He failed bipap and required intubation per EDP.  Initial work up consistent with hypoxic respiratory failure   STUDIES:  5/13  CXR >> R effusion, pattern c/w pulmonary edema   SIGNIFICANT EVENTS: 5/13  Admit with SOB, hypotension & bradycardia  5/13 Got HD for hyperkalemia 5/14 weaning, off all pressors. Lasix gtt stopped as auto-diuresing   VITAL SIGNS: Temp:  [97.7 F (36.5 C)-99.5 F (37.5 C)] 97.7 F (36.5 C) (05/14 0801) Pulse Rate:  [54-129] 97 (05/14 0730) Resp:  [0-35] 35 (05/14 0312) BP: (92-163)/(47-81) 122/57 mmHg (05/14 0730) SpO2:  [90 %-100 %] 100 % (05/14 0730) FiO2 (%):  [50 %-100 %] 50 % (05/14 0500) Weight:  [125.1 kg (275 lb 12.7 oz)] 125.1 kg (275 lb 12.7 oz) (05/14 0454)   HEMODYNAMICS:     VENTILATOR SETTINGS: Vent Mode:  [-] PRVC FiO2 (%):  [50 %-100 %] 50 % Set Rate:  [20 bmp-35 bmp] 35 bmp Vt Set:  [530 mL-580 mL] 530 mL PEEP:  [10 cmH20-15 cmH20] 10 cmH20 Plateau Pressure:  [22 cmH20-35 cmH20] 24 cmH20   INTAKE / OUTPUT:  Intake/Output Summary (Last 24 hours) at 05/17/14 0834 Last data filed at 05/17/14 0755  Gross per 24 hour  Intake 4775.11 ml  Output   9450 ml  Net -4674.89 ml    PHYSICAL EXAMINATION: General:  Obese male on vent in no acute distress Neuro:  [place] HEENT:  OETT, MM moist, short / thick neck  Cardiovascular:  s1s2 irr irr, slow fib on monitor  Lungs:  resp's even/non-labored on vent, lungs bilaterally diminished  Abdomen:  Obese/soft, bsx4 active Musculoskeletal:  No acute deformities  Skin:  Warm/dry, 2-3+ pitting pre-tibial edema, long toenails /uncared for appearance   LABS:  CBC  Recent  Labs Lab 05/16/14 0618 05/16/14 0640 05/17/14 0500  WBC 8.3  --  6.2  HGB 9.6* 12.2* 9.7*  HCT 35.0* 36.0* 32.4*  PLT 178  --  174   Coag's  Recent Labs Lab 05/16/14 0618  INR 1.74*   BMET  Recent Labs Lab 05/16/14 1945 05/16/14 2345 05/17/14 0245  NA 140 139 141  K 5.6* 5.0 4.9  CL 100* 98* 96*  CO2 30 31 33*  BUN 35* 37* 38*  CREATININE 1.97* 1.93* 1.88*  GLUCOSE 258* 256* 226*   Electrolytes  Recent Labs Lab 05/16/14 1550 05/16/14 1945 05/16/14 2345 05/17/14 0245  CALCIUM 9.8 9.6 9.3 9.3  MG  --   --   --  1.5*  PHOS 2.8  --   --  3.1   Sepsis Markers No results for input(s): LATICACIDVEN, PROCALCITON, O2SATVEN in the last 168 hours.   ABG  Recent Labs Lab 05/16/14 0640 05/16/14 0929  PHART 7.246* 7.310*  PCO2ART 58.8* 51.6*  PO2ART 80.0 158.0*   Liver Enzymes  Recent Labs Lab 05/16/14 1550  ALBUMIN 3.3*     Cardiac Enzymes No results for input(s): TROPONINI, PROBNP in the last 168 hours.   Glucose  Recent Labs Lab 05/16/14 1037 05/16/14 1404 05/16/14 1553 05/16/14 1954 05/16/14 2351  GLUCAP 162* 179* 143* 228* 200*    Imaging Dg Chest [facility]  05/16/2014   CLINICAL DATA: Left IJ dialysis catheter placement.  EXAM: PORTABLE CHEST - 1 VIEW  COMPARISON:  One-view chest x-ray from the same day.  FINDINGS: The endotracheal tube terminates 7 point 4 cm above the carina. A right IJ line is stable. The NG tube courses off the inferior border the film.  A new left IJ dialysis catheter terminates in the midline, presumably within the innominate vein. Heart is enlarged. Mild generalized edema is stable. Bilateral effusions are again noted, right greater than left.  IMPRESSION: 1. Interval placement of left IJ dialysis catheter. The tip terminates at the midline, presumably within the innominate vein. 2. Support apparatus is otherwise stable. 3. Stable cardiomegaly. 4. Mild to moderate generalized edema. 5. Right greater than left pleural  effusions are stable.   Electronically Signed   By: San Morelle M.D.   On: 05/16/2014 15:09   Dg Chest Portable 1 View  05/16/2014   CLINICAL DATA:  Hypoxia  EXAM: PORTABLE CHEST - 1 VIEW  COMPARISON:  May 16, 2014  FINDINGS: Endotracheal tube tip is 4.6 cm above the carina. Central catheter tip is in the superior vena cava. Nasogastric tube tip and side port are below the diaphragm. No pneumothorax. There are bilateral pleural effusions with cardiomegaly and pulmonary venous hypertension. There is left lower lobe consolidation.  IMPRESSION: Tube and catheter positions as described without pneumothorax. Evidence of congestive heart failure. Opacity in the left base with consolidation may represent alveolar edema. Superimposed pneumonia is concerning in the left lower lobe.   Electronically Signed   By: Lowella Grip III M.D.   On: 05/16/2014 08:42   Dg Chest Port 1 View  05/16/2014   CLINICAL DATA:  Respiratory distress.  CHF.  EXAM: PORTABLE CHEST - 1 VIEW  COMPARISON:  04/17/2014  FINDINGS: Shallow inspiration. Cardiac enlargement with pulmonary vascular congestion. Bilateral pleural effusions with atelectasis in the lung bases. Perihilar airspace disease suggest edema. No pneumothorax. Calcification of the aorta.  IMPRESSION: Cardiac enlargement with pulmonary vascular congestion, bilateral pleural effusions, and perihilar edema.   Electronically Signed   By: Lucienne Capers M.D.   On: 05/16/2014 06:31  5/14: Aeration improving R>L airspace disease edema + effusions    ASSESSMENT / PLAN:  PULMONARY OETT 5/13 >>  A: Acute Hypoxic Respiratory Failure - in the setting of pulmonary edema  OSA - on CPAP (likley decompensated OHS/OSA) Pleural Effusions - bilateral, R>L P:   Wean PEEP / FiO2 to support sats > 90% Adjusted Ve to allow him to set his own Daily SBT / WUA Will need CPAP post extubation QHS See CV  Q4 PRN Albuterol   CARDIOVASCULAR CVL R IJ TLC 5/13 (ER) >>  A:   dCHF - ? If untreated OSA (non-compliance) is leading to multiple re-admits for decompensation  Atrial Fibrillation  Bradycardia  Hypotension -->resolved and off pressors P:  Holding lasix gtt per nephrology  Cardiology consulted, appreciate input  Resume coumadin  F/u INR Rate control as needed  RENAL A:   Acute on Chronic Renal Failure  Hyperkalemia -->got HD 5/13 Now w/ post-injury diuresis  P:   Trend chemistries Strict I&O Stopping lasix per renal  GASTROINTESTINAL A:   Nausea Morbid Obesity  P:   PRN zofran TF per ICU protocol  PRN bowel regimen with narcotics  HEMATOLOGIC A:   Anemia  P:  Trend CBC Heparin for DVT prophylaxis   INFECTIOUS A:   No overt concerns for infection  P:   Monitor  fever curve / WBC  ENDOCRINE A:   Hyperglycemia  P:   Monitor glucose on BMP if consistently > 180, add SSI   NEUROLOGIC A:   Acute Metabolic Encephalopathy -->improved 5/14 P:   RASS goal: -1 Change to PRN fent Early PT    FAMILY  - Updates: No family at bedside.     GLOBAL:  We will need to consider maximum home health support vs placement given multiple recent admissions.  ? Adherence to medication regimen and CPAP. Suspect that this was decompensated OHS/OSA-->decompensated diastolic HF-->acute on Chronic renal failure-->edema then acute respiratory failure. Seems to be improved w/ diuresis and 1 round of dialysis. Will start PSV trials  Erick Colace ACNP-BC [place] Pager # [phone] OR # [phone] if no answer   Attending Note:  I have examined patient, reviewed labs, studies and notes. I have discussed the case with Jerrye Bushy, and I agree with the data and plans as amended above. Pt has experienced recurrent similar episodes of acute on chronic respiratory failure. He has decompensated diastolic and systolic CHF, renal failure, metabolic disarray. OSA is likely playing a role as well. On my eval today he is ventilated,  awake. He is starting to make urine and lasix has been stopped. We will assess him for SBT today, consider possible extubation. Hopefully his UOP will continue off lasix and he will have renal recovery.  Independent critical care time is 40 minutes.   Baltazar Apo, MD, PhD 05/17/2014, 2:01 PM [facility] [phone] or if no answer [phone]

## 2014-05-17 NOTE — Progress Notes (Addendum)
Note on wrong patient

## 2014-05-17 NOTE — Procedures (Signed)
Extubation Procedure Note  Patient Details:   Name: William Flores DOB: 06/29/1950 MRN: [id number]   Airway Documentation:     Evaluation  O2 sats: stable throughout Complications: No apparent complications Patient did tolerate procedure well. Bilateral Breath Sounds: Clear Suctioning: Oral, Airway Yes   Patient extubated to 2L nasal cannula per MD order.  Positive cuff leak noted.  No evidence of stridor.  Sats currently 95%.  Vitals are stable.  Incentive spirometry performed x10 with achieved goal of 1000.  Patient able to speak post extubation.  No apparent complications.    Philomena Doheny 05/17/2014, 2:47 PM

## 2014-05-17 NOTE — Progress Notes (Signed)
Subjective:  Had 9 liters of UOP- net about 4-5 liters out- also received HD late yest afternoon for K with 1000 removed- O2 req down and with elevated bicarb Objective Vital signs in last 24 hours: Filed Vitals:   05/17/14 0454 05/17/14 0500 05/17/14 0600 05/17/14 0700  BP:  108/58 92/61 101/59  Pulse:  84 74 83  Temp:      TempSrc:      Resp:      Height:      Weight: 125.1 kg (275 lb 12.7 oz)     SpO2:  100% 100% 100%   Weight change: -6.443 kg (-14 lb 3.3 oz)  Intake/Output Summary (Last 24 hours) at 05/17/14 0741 Last data filed at 05/17/14 0700  Gross per 24 hour  Intake 4695.11 ml  Output   9950 ml  Net -5254.89 ml     Assessment/Plan: 64 year old male with multiple medical issues- DM,HTN,CHF and atrial arrhythmias as well as OSA with home O2 requirement. Also with some baseline CKD- he now presents with A on CRF with hyperkalemia 1.Renal- A on CRF in the setting of hypotension and volume overload- not sure if worsening heart failure came first- leading to decreased perfusion and A on CRF or a primary renal insult causing worsening volume status. U/A is clear which argues for hemodynamic cause.  The only reason that he needed HD last night was for hyperkalemia- now resolved.  Is maybe having a post injury diuresis- will stop lasix and follow- hopefully will not need further HD 2. Hypertension/volume - volume overloaded but is responding quite well to lasix. I am going to stop lasix 3. Hyperkalemia- resolved after HD 4. Anemia - variable results- seems to be in the 9's- no treatment needed yet 5. Elevated bicarb- on bicarb drip- will stop this as well   Dianey Suchy A    Labs: Basic Metabolic Panel:  Recent Labs Lab 05/16/14 1550 05/16/14 1945 05/16/14 2345 05/17/14 0245  NA 141 140 139 141  K 6.8* 5.6* 5.0 4.9  CL 103 100* 98* 96*  CO2 29 30 31  33*  GLUCOSE 242* 258* 256* 226*  BUN 40* 35* 37* 38*  CREATININE 2.14* 1.97* 1.93* 1.88*  CALCIUM 9.8  9.6 9.3 9.3  PHOS 2.8  --   --  3.1   Liver Function Tests:  Recent Labs Lab 05/16/14 1550  ALBUMIN 3.3*   No results for input(s): LIPASE, AMYLASE in the last 168 hours. No results for input(s): AMMONIA in the last 168 hours. CBC:  Recent Labs Lab 05/16/14 0618 05/16/14 0640 05/17/14 0500  WBC 8.3  --  6.2  NEUTROABS 5.6  --   --   HGB 9.6* 12.2* 9.7*  HCT 35.0* 36.0* 32.4*  MCV 93.3  --  86.4  PLT 178  --  174   Cardiac Enzymes: No results for input(s): CKTOTAL, CKMB, CKMBINDEX, TROPONINI in the last 168 hours. CBG:  Recent Labs Lab 05/16/14 1037 05/16/14 1404 05/16/14 1553 05/16/14 1954 05/16/14 2351  GLUCAP 162* 179* 143* 228* 200*    Iron Studies: No results for input(s): IRON, TIBC, TRANSFERRIN, FERRITIN in the last 72 hours. Studies/Results: Dg Chest Port 1 View  05/16/2014   CLINICAL DATA: Left IJ dialysis catheter placement.  EXAM: PORTABLE CHEST - 1 VIEW  COMPARISON:  One-view chest x-ray from the same day.  FINDINGS: The endotracheal tube terminates 7 point 4 cm above the carina. A right IJ line is stable. The NG tube courses off the inferior  border the film.  A new left IJ dialysis catheter terminates in the midline, presumably within the innominate vein. Heart is enlarged. Mild generalized edema is stable. Bilateral effusions are again noted, right greater than left.  IMPRESSION: 1. Interval placement of left IJ dialysis catheter. The tip terminates at the midline, presumably within the innominate vein. 2. Support apparatus is otherwise stable. 3. Stable cardiomegaly. 4. Mild to moderate generalized edema. 5. Right greater than left pleural effusions are stable.   Electronically Signed   By: San Morelle M.D.   On: 05/16/2014 15:09   Dg Chest Portable 1 View  05/16/2014   CLINICAL DATA:  Hypoxia  EXAM: PORTABLE CHEST - 1 VIEW  COMPARISON:  May 16, 2014  FINDINGS: Endotracheal tube tip is 4.6 cm above the carina. Central catheter tip is in the  superior vena cava. Nasogastric tube tip and side port are below the diaphragm. No pneumothorax. There are bilateral pleural effusions with cardiomegaly and pulmonary venous hypertension. There is left lower lobe consolidation.  IMPRESSION: Tube and catheter positions as described without pneumothorax. Evidence of congestive heart failure. Opacity in the left base with consolidation may represent alveolar edema. Superimposed pneumonia is concerning in the left lower lobe.   Electronically Signed   By: Lowella Grip III M.D.   On: 05/16/2014 08:42   Dg Chest Port 1 View  05/16/2014   CLINICAL DATA:  Respiratory distress.  CHF.  EXAM: PORTABLE CHEST - 1 VIEW  COMPARISON:  04/17/2014  FINDINGS: Shallow inspiration. Cardiac enlargement with pulmonary vascular congestion. Bilateral pleural effusions with atelectasis in the lung bases. Perihilar airspace disease suggest edema. No pneumothorax. Calcification of the aorta.  IMPRESSION: Cardiac enlargement with pulmonary vascular congestion, bilateral pleural effusions, and perihilar edema.   Electronically Signed   By: Lucienne Capers M.D.   On: 05/16/2014 06:31   Medications: Infusions: . sodium chloride 1,000 mL (05/16/14 1045)  . DOPamine Stopped (05/17/14 0248)  . fentaNYL infusion INTRAVENOUS 175 mcg/hr (05/17/14 0230)  . norepinephrine (LEVOPHED) Adult infusion Stopped (05/16/14 1200)  .  sodium bicarbonate  infusion 1000 mL 125 mL/hr at 05/17/14 7867    Scheduled Medications: . antiseptic oral rinse  7 mL Mouth Rinse QID  . chlorhexidine  15 mL Mouth Rinse BID  . famotidine  20 mg Per Tube BID  . feeding supplement (PRO-STAT SUGAR FREE 64)  30 mL Per Tube QID  . feeding supplement (VITAL HIGH PROTEIN)  1,000 mL Per Tube Q24H  . fentaNYL (SUBLIMAZE) injection  50 mcg Intravenous Once  . furosemide  160 mg Intravenous Q8H  . heparin  5,000 Units Subcutaneous 3 times per day    have reviewed scheduled and prn medications.  Physical  Exam: General: sedated on vent Heart: irreg Lungs: mostly clear Abdomen: soft Extremities: some pitting edema but much better than yest Dialysis Access: IJ vascath placed 5/13    05/17/2014,7:41 AM  LOS: 1 day

## 2014-05-17 NOTE — Progress Notes (Signed)
Placed pt on CPAP at 7 cmH2O with FFM per home use. 6L O2 bleed in. H2o chamber filled. Pt tolerating well at this time.

## 2014-05-17 NOTE — Progress Notes (Signed)
ANTICOAGULATION CONSULT NOTE - Initial Consult  Pharmacy Consult for Warfarin Indication: atrial fibrillation  Allergies  Allergen Reactions  . Codeine     Headache     Patient Measurements: Height: 5' 7"  (170.2 cm) Weight: 275 lb 12.7 oz (125.1 kg) IBW/kg (Calculated) : 66.1  Vital Signs: Temp: 97.7 F (36.5 C) (05/14 0801) Temp Source: Oral (05/14 0801) BP: 106/57 mmHg (05/14 1000) Pulse Rate: 119 (05/14 1000)  Labs:  Recent Labs  05/16/14 0618 05/16/14 0640  05/16/14 2345 05/17/14 0245 05/17/14 0500 05/17/14 0700  HGB 9.6* 12.2*  --   --   --  9.7*  --   HCT 35.0* 36.0*  --   --   --  32.4*  --   PLT 178  --   --   --   --  174  --   LABPROT 20.5*  --   --   --   --   --   --   INR 1.74*  --   --   --   --   --   --   CREATININE 2.35* 2.50*  < > 1.93* 1.88*  --  1.98*  < > = values in this interval not displayed.  Estimated Creatinine Clearance: 49.1 mL/min (by C-G formula based on Cr of 1.98).   Medical History: Past Medical History  Diagnosis Date  . Diabetes   . Colon cancer   . Hypertension   . Sleep apnea   . Atrial fibrillation   . Hyperlipidemia   . CHF (congestive heart failure)   . Colon polyps 09/21/2012    Tubular adenoma    Medications:  Prescriptions prior to admission  Medication Sig Dispense Refill Last Dose  . allopurinol (ZYLOPRIM) 100 MG tablet Take 100 mg by mouth daily.  3 unknown  . atorvastatin (LIPITOR) 20 MG tablet Take 1 tablet (20 mg total) by mouth daily at 6 PM. 30 tablet 0 Taking  . azelastine (OPTIVAR) 0.05 % ophthalmic solution Place 2 drops into both eyes 2 (two) times daily. 6 mL 12 Taking  . benzonatate (TESSALON) 100 MG capsule TAKE 1 CAPSULE (100 MG TOTAL) BY MOUTH 2 (TWO) TIMES DAILY. 20 capsule 0   . benzonatate (TESSALON) 100 MG capsule Take 1 capsule (100 mg total) by mouth 2 (two) times daily as needed for cough. 20 capsule 0   . diltiazem (CARDIZEM CD) 300 MG 24 hr capsule Take 1 capsule (300 mg total) by  mouth daily.     . ferrous sulfate 325 (65 FE) MG tablet Take 1 tablet (325 mg total) by mouth 2 (two) times daily with a meal.  3 unknown  . furosemide (LASIX) 40 MG tablet Take 40 mg by mouth daily.  3 unknown  . glucose blood test strip Use to check BG daily 100 each 2 unknown  . insulin aspart (NOVOLOG) 100 UNIT/ML injection Inject 0-9 Units into the skin 3 (three) times daily with meals. Sliding scale CBG 70 - 120: 0 units CBG 121 - 150: 1 unit,  CBG 151 - 200: 2 units,  CBG 201 - 250: 3 units,  CBG 251 - 300: 5 units,  CBG 301 - 350: 7 units,  CBG 351 - 400: 9 units   CBG > 400: 9 units and notify your MD 10 mL 11   . KLOR-CON M20 20 MEQ tablet Take 10 mEq by mouth daily.  11 unknown  . metoprolol tartrate (LOPRESSOR) 25 MG tablet Take 1 tablet (25 mg  total) by mouth every 8 (eight) hours.     Marland Kitchen omeprazole (PRILOSEC) 20 MG capsule TAKE 1 CAPSULE (20 MG TOTAL) BY MOUTH DAILY. 30 capsule 4 unknown  . ONETOUCH DELICA LANCETS 78M MISC 1 each by Does not apply route daily. Use one lancet to check BG daily 100 each 2 unknown  . polyvinyl alcohol (LIQUIFILM TEARS) 1.4 % ophthalmic solution Place 1 drop into both eyes as needed for dry eyes. 15 mL 0   . warfarin (COUMADIN) 10 MG tablet Take 1 tablet (10 mg total) by mouth daily at 6 PM. (Patient not taking: Reported on 04/14/2014) 30 tablet 0 Not Taking at Unknown time    Assessment: [name] who presented with SOB, hypotension and bradycardia and intubated in ED. Patient has a history of Atrial fibrillation and was supposed to be on Coumadin prior to admission. His INR on admission was sub-therapeutic at 1.74 likely due to non-compliance. Pharmacy consulted to resume his Coumadin therapy. Currently not on any medications that interact with Coumadin but is on subQ heparin  Home Coumadin dose (per clinic notes): 7.5 mg daily except 10 mg on Mon/Fri.   Goal of Therapy:  INR 2-3 Monitor platelets by anticoagulation protocol: Yes   Plan:  -Give  Coumadin 10 mg x 1 dose today -Monitor daily PT/INR -Re-educate on Coumadin and stress importance of compliance  -Continue SubQ heparin till INR > 2.   Albertina Parr, PharmD., BCPS Clinical Pharmacist Pager [phone]

## 2014-05-18 LAB — RENAL FUNCTION PANEL
Albumin: 3 g/dL — ABNORMAL LOW (ref 3.5–5.0)
Anion gap: 7 (ref 5–15)
BUN: 39 mg/dL — ABNORMAL HIGH (ref 6–20)
CO2: 39 mmol/L — ABNORMAL HIGH (ref 22–32)
Calcium: 8.8 mg/dL — ABNORMAL LOW (ref 8.9–10.3)
Chloride: 98 mmol/L — ABNORMAL LOW (ref 101–111)
Creatinine, Ser: 1.48 mg/dL — ABNORMAL HIGH (ref 0.61–1.24)
GFR calc Af Amer: 57 mL/min — ABNORMAL LOW
GFR calc non Af Amer: 49 mL/min — ABNORMAL LOW
Glucose, Bld: 95 mg/dL (ref 65–99)
Phosphorus: 5.2 mg/dL — ABNORMAL HIGH (ref 2.5–4.6)
Potassium: 5 mmol/L (ref 3.5–5.1)
Sodium: 144 mmol/L (ref 135–145)

## 2014-05-18 LAB — GLUCOSE, CAPILLARY
GLUCOSE-CAPILLARY: 88 mg/dL (ref 65–99)
GLUCOSE-CAPILLARY: 140 mg/dL — AB (ref 65–99)
Glucose-Capillary: 112 mg/dL — ABNORMAL HIGH (ref 65–99)
Glucose-Capillary: 121 mg/dL — ABNORMAL HIGH (ref 65–99)
Glucose-Capillary: 127 mg/dL — ABNORMAL HIGH (ref 65–99)
Glucose-Capillary: 135 mg/dL — ABNORMAL HIGH (ref 65–99)
Glucose-Capillary: 89 mg/dL (ref 65–99)

## 2014-05-18 LAB — PROTIME-INR
INR: 2.37 — ABNORMAL HIGH (ref 0.00–1.49)
Prothrombin Time: 26.1 s — ABNORMAL HIGH (ref 11.6–15.2)

## 2014-05-18 LAB — BASIC METABOLIC PANEL WITH GFR
Anion gap: 9 (ref 5–15)
BUN: 36 mg/dL — ABNORMAL HIGH (ref 6–20)
CO2: 40 mmol/L — ABNORMAL HIGH (ref 22–32)
Calcium: 8.9 mg/dL (ref 8.9–10.3)
Chloride: 96 mmol/L — ABNORMAL LOW (ref 101–111)
Creatinine, Ser: 1.49 mg/dL — ABNORMAL HIGH (ref 0.61–1.24)
GFR calc Af Amer: 56 mL/min — ABNORMAL LOW
GFR calc non Af Amer: 49 mL/min — ABNORMAL LOW
Glucose, Bld: 95 mg/dL (ref 65–99)
Potassium: 4.1 mmol/L (ref 3.5–5.1)
Sodium: 145 mmol/L (ref 135–145)

## 2014-05-18 MED ORDER — METOPROLOL TARTRATE 25 MG PO TABS
25.0000 mg | ORAL_TABLET | Freq: Two times a day (BID) | ORAL | Status: DC
  Administered 2014-05-18 – 2014-05-22 (×9): 25 mg via ORAL
  Filled 2014-05-18 (×10): qty 1

## 2014-05-18 MED ORDER — WARFARIN SODIUM 5 MG PO TABS
5.0000 mg | ORAL_TABLET | Freq: Once | ORAL | Status: AC
  Administered 2014-05-18: 5 mg via ORAL
  Filled 2014-05-18: qty 1

## 2014-05-18 MED ORDER — INSULIN ASPART 100 UNIT/ML ~~LOC~~ SOLN
2.0000 [IU] | Freq: Three times a day (TID) | SUBCUTANEOUS | Status: DC
  Administered 2014-05-18 (×3): 2 [IU] via SUBCUTANEOUS
  Administered 2014-05-19: 4 [IU] via SUBCUTANEOUS
  Administered 2014-05-20: 2 [IU] via SUBCUTANEOUS
  Administered 2014-05-20: 4 [IU] via SUBCUTANEOUS
  Administered 2014-05-21: 2 [IU] via SUBCUTANEOUS
  Administered 2014-05-21: 4 [IU] via SUBCUTANEOUS
  Administered 2014-05-21: 2 [IU] via SUBCUTANEOUS

## 2014-05-18 NOTE — Progress Notes (Signed)
Subjective:  Had 4.6 liters of UOP- net about 3 liters out- off lasix- just autodiuresing- extubated  Objective Vital signs in last 24 hours: Filed Vitals:   05/18/14 0402 05/18/14 0500 05/18/14 0600 05/18/14 0700  BP:  135/66 126/58 124/63  Pulse:  114 60 111  Temp: 97.9 F (36.6 C)     TempSrc: Oral     Resp:      Height:      Weight:  119.1 kg (262 lb 9.1 oz)    SpO2:  87% 98% 100%   Weight change: -6 kg (-13 lb 3.6 oz)  Intake/Output Summary (Last 24 hours) at 05/18/14 0758 Last data filed at 05/18/14 0700  Gross per 24 hour  Intake 1031.63 ml  Output   4660 ml  Net -3628.37 ml     Assessment/Plan: 64 year old male with multiple medical issues- DM,HTN,CHF and atrial arrhythmias as well as OSA with home O2 requirement. Also with some baseline CKD- he now presents with A on CRF with hyperkalemia 1.Renal- A on CRF in the setting of hypotension and volume overload- not sure if worsening heart failure came first- leading to decreased perfusion and A on CRF or a primary renal insult causing worsening volume status. U/A is clear which argues for hemodynamic cause.  The only reason that he needed HD Friday night was for hyperkalemia- now resolved.  Does not look like will need further HD- autodiuresing off of lasix 2. Hypertension/volume - volume overloaded but initially responded to lasix, now off 3. Hyperkalemia- resolved after HD 4. Anemia - variable results- seems to be in the 9's- no treatment needed yet 5. Elevated bicarb- possibly due to brisk diuresis vs pulm reason  Renal will sign off- may use HD cath for IV access but if do not need OK to remove    Teretha Chalupa A    Labs: Basic Metabolic Panel:  Recent Labs Lab 05/16/14 1550  05/17/14 0245  05/17/14 2301 05/18/14 0455 05/18/14 0646  NA 141  < > 141  < > 142 144 145  K 6.8*  < > 4.9  < > 4.7 5.0 4.1  CL 103  < > 96*  < > 94* 98* 96*  CO2 29  < > 33*  < > 38* 39* 40*  GLUCOSE 242*  < > 226*  < >  117* 95 95  BUN 40*  < > 38*  < > 42* 39* 36*  CREATININE 2.14*  < > 1.88*  < > 1.59* 1.48* 1.49*  CALCIUM 9.8  < > 9.3  < > 8.8* 8.8* 8.9  PHOS 2.8  --  3.1  --   --  5.2*  --   < > = values in this interval not displayed. Liver Function Tests:  Recent Labs Lab 05/16/14 1550 05/18/14 0455  ALBUMIN 3.3* 3.0*   No results for input(s): LIPASE, AMYLASE in the last 168 hours. No results for input(s): AMMONIA in the last 168 hours. CBC:  Recent Labs Lab 05/16/14 0618 05/16/14 0640 05/17/14 0500  WBC 8.3  --  6.2  NEUTROABS 5.6  --   --   HGB 9.6* 12.2* 9.7*  HCT 35.0* 36.0* 32.4*  MCV 93.3  --  86.4  PLT 178  --  174   Cardiac Enzymes: No results for input(s): CKTOTAL, CKMB, CKMBINDEX, TROPONINI in the last 168 hours. CBG:  Recent Labs Lab 05/17/14 1205 05/17/14 1625 05/17/14 1955 05/18/14 0004 05/18/14 0401  GLUCAP 158* 96 94 112* 88  Iron Studies: No results for input(s): IRON, TIBC, TRANSFERRIN, FERRITIN in the last 72 hours. Studies/Results: Dg Chest Port 1 View  05/17/2014   CLINICAL DATA:  CHF  EXAM: PORTABLE CHEST - 1 VIEW  COMPARISON:  Yesterday  FINDINGS: Tubular device is stable. Mild cardiomegaly. Small right pleural effusion stable. Vascular congestion and edema have slightly improved. No pneumothorax.  IMPRESSION: Slightly improved edema. Otherwise stable including a small right pleural effusion.   Electronically Signed   By: Marybelle Killings M.D.   On: 05/17/2014 08:20   Dg Chest Port 1 View  05/16/2014   CLINICAL DATA: Left IJ dialysis catheter placement.  EXAM: PORTABLE CHEST - 1 VIEW  COMPARISON:  One-view chest x-ray from the same day.  FINDINGS: The endotracheal tube terminates 7 point 4 cm above the carina. A right IJ line is stable. The NG tube courses off the inferior border the film.  A new left IJ dialysis catheter terminates in the midline, presumably within the innominate vein. Heart is enlarged. Mild generalized edema is stable. Bilateral  effusions are again noted, right greater than left.  IMPRESSION: 1. Interval placement of left IJ dialysis catheter. The tip terminates at the midline, presumably within the innominate vein. 2. Support apparatus is otherwise stable. 3. Stable cardiomegaly. 4. Mild to moderate generalized edema. 5. Right greater than left pleural effusions are stable.   Electronically Signed   By: San Morelle M.D.   On: 05/16/2014 15:09   Dg Chest Portable 1 View  05/16/2014   CLINICAL DATA:  Hypoxia  EXAM: PORTABLE CHEST - 1 VIEW  COMPARISON:  May 16, 2014  FINDINGS: Endotracheal tube tip is 4.6 cm above the carina. Central catheter tip is in the superior vena cava. Nasogastric tube tip and side port are below the diaphragm. No pneumothorax. There are bilateral pleural effusions with cardiomegaly and pulmonary venous hypertension. There is left lower lobe consolidation.  IMPRESSION: Tube and catheter positions as described without pneumothorax. Evidence of congestive heart failure. Opacity in the left base with consolidation may represent alveolar edema. Superimposed pneumonia is concerning in the left lower lobe.   Electronically Signed   By: Lowella Grip III M.D.   On: 05/16/2014 08:42   Medications: Infusions:    Scheduled Medications: . antiseptic oral rinse  7 mL Mouth Rinse q12n4p  . chlorhexidine  15 mL Mouth Rinse BID  . heparin  5,000 Units Subcutaneous 3 times per day  . insulin aspart  2-6 Units Subcutaneous 6 times per day  . Warfarin - Pharmacist Dosing Inpatient   Does not apply q1800    have reviewed scheduled and prn medications.  Physical Exam: General: extubated Heart: irreg Lungs: mostly clear Abdomen: soft Extremities: some pitting edema but much better than yest Dialysis Access: IJ vascath placed 5/13    05/18/2014,7:58 AM  LOS: 2 days

## 2014-05-18 NOTE — Progress Notes (Signed)
PULMONARY / CRITICAL CARE MEDICINE   Name: William Flores MRN: [id number] DOB: [birth year]    ADMISSION DATE:  05/16/2014 CONSULTATION DATE:  05/16/14  REFERRING MD :  Dr. Jeneen Rinks   CHIEF COMPLAINT:  Respiratory Failure   INITIAL PRESENTATION: 64 y/o M who presented to [facility] ER on 5/13 with SOB, hypotension and bradycardia.  He failed bipap and required intubation per EDP.  Initial work up consistent with hypoxic respiratory failure   STUDIES:  5/13  CXR >> R effusion, pattern c/w pulmonary edema   SIGNIFICANT EVENTS: 5/13  Admit with SOB, hypotension & bradycardia  5/13 Got HD for hyperkalemia 5/14 weaning, off all pressors. Lasix gtt stopped as auto-diuresing; extubated   VITAL SIGNS: Temp:  [97.6 F (36.4 C)-98.7 F (37.1 C)] 98 F (36.7 C) (05/15 0819) Pulse Rate:  [60-119] 82 (05/15 0800) Resp:  [20] 20 (05/14 1144) BP: (106-144)/(51-94) 127/69 mmHg (05/15 0800) SpO2:  [87 %-100 %] 100 % (05/15 0800) FiO2 (%):  [40 %-50 %] 40 % (05/14 1400) Weight:  [119.1 kg (262 lb 9.1 oz)] 119.1 kg (262 lb 9.1 oz) (05/15 0500)       VENTILATOR SETTINGS: Vent Mode:  [-] PRVC FiO2 (%):  [40 %-50 %] 40 % Set Rate:  [20 bmp-35 bmp] 20 bmp Vt Set:  [530 mL] 530 mL PEEP:  [5 cmH20] 5 cmH20 Plateau Pressure:  [14 cmH20] 14 cmH20   INTAKE / OUTPUT:  Intake/Output Summary (Last 24 hours) at 05/18/14 0852 Last data filed at 05/18/14 0800  Gross per 24 hour  Intake    955 ml  Output   4360 ml  Net  -3405 ml    PHYSICAL EXAMINATION: General:  Obese male no distress.  Neuro:  Awake, no focal def  HEENT: MM moist, short / thick neck; moves all ext Cardiovascular:  s1s2 irr irr, slow fib on monitor  Lungs:  Clear, no accessory muscle use.  Abdomen:  Obese/soft, bsx4 active Musculoskeletal:  No acute deformities  Skin:  Warm/dry, 2+ pitting pre-tibial edema, long toenails /uncared for appearance   LABS:  CBC  Recent Labs Lab 05/16/14 0618 05/16/14 0640 05/17/14 0500  WBC 8.3   --  6.2  HGB 9.6* 12.2* 9.7*  HCT 35.0* 36.0* 32.4*  PLT 178  --  174   Coag's  Recent Labs Lab 05/16/14 0618 05/18/14 0646  INR 1.74* 2.37*   BMET  Recent Labs Lab 05/17/14 2301 05/18/14 0455 05/18/14 0646  NA 142 144 145  K 4.7 5.0 4.1  CL 94* 98* 96*  CO2 38* 39* 40*  BUN 42* 39* 36*  CREATININE 1.59* 1.48* 1.49*  GLUCOSE 117* 95 95   Electrolytes  Recent Labs Lab 05/16/14 1550  05/17/14 0245  05/17/14 2301 05/18/14 0455 05/18/14 0646  CALCIUM 9.8  < > 9.3  < > 8.8* 8.8* 8.9  MG  --   --  1.5*  --   --   --   --   PHOS 2.8  --  3.1  --   --  5.2*  --   < > = values in this interval not displayed. Sepsis Markers No results for input(s): LATICACIDVEN, PROCALCITON, O2SATVEN in the last 168 hours.   ABG  Recent Labs Lab 05/16/14 0640 05/16/14 0929  PHART 7.246* 7.310*  PCO2ART 58.8* 51.6*  PO2ART 80.0 158.0*   Liver Enzymes  Recent Labs Lab 05/16/14 1550 05/18/14 0455  ALBUMIN 3.3* 3.0*     Cardiac Enzymes No results for  input(s): TROPONINI, PROBNP in the last 168 hours.   Glucose  Recent Labs Lab 05/17/14 0758 05/17/14 1205 05/17/14 1625 05/17/14 1955 05/18/14 0004 05/18/14 0401  GLUCAP 193* 158* 96 94 112* 88    Imaging Dg Chest Port 1 View  05/17/2014   CLINICAL DATA:  CHF  EXAM: PORTABLE CHEST - 1 VIEW  COMPARISON:  Yesterday  FINDINGS: Tubular device is stable. Mild cardiomegaly. Small right pleural effusion stable. Vascular congestion and edema have slightly improved. No pneumothorax.  IMPRESSION: Slightly improved edema. Otherwise stable including a small right pleural effusion.   Electronically Signed   By: Marybelle Killings M.D.   On: 05/17/2014 08:20  5/14: Aeration improving R>L airspace disease edema + effusions; no film 5/15   ASSESSMENT / PLAN:  PULMONARY OETT 5/13 >> 5/14 A: Acute Hypoxic Respiratory Failure - in the setting of pulmonary edema  OSA - on CPAP (likley decompensated OHS/OSA) Pleural Effusions -  bilateral, R>L Extubated 5/14, doing well.  P:   CPAP at HS OOB/mobilize Q4 PRN Albuterol  Wean FIO2  CARDIOVASCULAR CVL R IJ TLC 5/13 (ER) >>  A:  dCHF - ? If untreated OSA (non-compliance) is leading to multiple re-admits for decompensation  Atrial Fibrillation  Bradycardia  Hypotension -->resolved and off pressors P:  Cardiology consulted, appreciate input  Coumadin per pharmacy  Rate control; add back BB  RENAL A:   Acute on Chronic Renal Failure  Hyperkalemia -->got HD 5/13 Now w/ post-injury diuresis -->renal signed off P:   Trend chemistries Strict I&O Stopping lasix per renal recs  GASTROINTESTINAL A:   Nausea Morbid Obesity  P:   PRN zofran TF per ICU protocol  PRN bowel regimen while on narcotics  HEMATOLOGIC A:   Anemia  P:  Trend CBC Heparin for DVT prophylaxis   INFECTIOUS A:   No overt concerns for infection  P:   Monitor fever curve / WBC  ENDOCRINE A:   Hyperglycemia  P:   Monitor glucose on BMP if consistently > 180, add SSI   NEUROLOGIC A:   Acute Metabolic Encephalopathy -->improved 5/14 P:   RASS goal: 0 Change to PRN fent Early PT    FAMILY  - Updates: No family at bedside.     GLOBAL:  We will need to consider maximum home health support vs placement given multiple recent admissions.  ? Adherence to medication regimen and CPAP. Suspect that this was decompensated OHS/OSA-->decompensated diastolic HF-->acute on Chronic renal failure-->edema then acute respiratory failure. Now extubated. Ready to move out of unit. Can go tele w/ BIPAP at [facility] [place] ACNP-[facility] Pager # [phone] OR # [phone] if no answer  Attending Note:  I have examined patient, reviewed labs, studies and notes. I have discussed the case with Jerrye Bushy, and I agree with the data and plans as amended above.   Baltazar Apo, MD, PhD 05/18/2014, 1:54 PM [facility] [phone] or if no answer  [phone]

## 2014-05-18 NOTE — Progress Notes (Signed)
ANTICOAGULATION CONSULT NOTE - Follow Up Consult  Pharmacy Consult for Warfarin Indication: atrial fibrillation  Allergies  Allergen Reactions  . Codeine     Headache     Patient Measurements: Height: 5' 7"  (170.2 cm) Weight: 262 lb 9.1 oz (119.1 kg) IBW/kg (Calculated) : 66.1  Vital Signs: Temp: 98 F (36.7 C) (05/15 0819) Temp Source: Axillary (05/15 0819) BP: 127/69 mmHg (05/15 0800) Pulse Rate: 82 (05/15 0800)  Labs:  Recent Labs  05/16/14 0618 05/16/14 0640  05/17/14 0500  05/17/14 2301 05/18/14 0455 05/18/14 0646  HGB 9.6* 12.2*  --  9.7*  --   --   --   --   HCT 35.0* 36.0*  --  32.4*  --   --   --   --   PLT 178  --   --  174  --   --   --   --   LABPROT 20.5*  --   --   --   --   --   --  26.1*  INR 1.74*  --   --   --   --   --   --  2.37*  CREATININE 2.35* 2.50*  < >  --   < > 1.59* 1.48* 1.49*  < > = values in this interval not displayed.  Estimated Creatinine Clearance: 63.5 mL/min (by C-G formula based on Cr of 1.49).   Medical History: Past Medical History  Diagnosis Date  . Diabetes   . Colon cancer   . Hypertension   . Sleep apnea   . Atrial fibrillation   . Hyperlipidemia   . CHF (congestive heart failure)   . Colon polyps 09/21/2012    Tubular adenoma    Medications:  Prescriptions prior to admission  Medication Sig Dispense Refill Last Dose  . allopurinol (ZYLOPRIM) 100 MG tablet Take 100 mg by mouth daily.  3 05/16/2014 at Unknown time  . azelastine (OPTIVAR) 0.05 % ophthalmic solution Place 2 drops into both eyes 2 (two) times daily. (Patient taking differently: Place 2 drops into both eyes daily as needed. ) 6 mL 12 Past Week at Unknown time  . benzonatate (TESSALON) 100 MG capsule Take 1 capsule (100 mg total) by mouth 2 (two) times daily as needed for cough. 20 capsule 0 Past Week at Unknown time  . diltiazem (CARDIZEM CD) 360 MG 24 hr capsule Take 360 mg by mouth daily.  5 05/16/2014 at Unknown time  . ferrous sulfate 325 (65  FE) MG tablet Take 1 tablet (325 mg total) by mouth 2 (two) times daily with a meal.  3 05/16/2014 at Unknown time  . furosemide (LASIX) 40 MG tablet Take 40 mg by mouth daily.  3 05/16/2014 at Unknown time  . insulin aspart (NOVOLOG) 100 UNIT/ML injection Inject 0-9 Units into the skin 3 (three) times daily with meals. Sliding scale CBG 70 - 120: 0 units CBG 121 - 150: 1 unit,  CBG 151 - 200: 2 units,  CBG 201 - 250: 3 units,  CBG 251 - 300: 5 units,  CBG 301 - 350: 7 units,  CBG 351 - 400: 9 units   CBG > 400: 9 units and notify your MD 10 mL 11 05/16/2014 at Unknown time  . KLOR-CON M20 20 MEQ tablet Take 10 mEq by mouth daily.  11 05/16/2014 at Unknown time  . lisinopril (PRINIVIL,ZESTRIL) 5 MG tablet Take 5 mg by mouth daily.  11 05/16/2014 at Unknown time  . metolazone (  ZAROXOLYN) 2.5 MG tablet Take 2.5 mg by mouth 2 (two) times daily.   2 05/16/2014 at Unknown time  . metoprolol tartrate (LOPRESSOR) 25 MG tablet Take 1 tablet (25 mg total) by mouth every 8 (eight) hours. (Patient taking differently: Take 25 mg by mouth 2 (two) times daily. )   05/16/2014 at 0830  . omeprazole (PRILOSEC) 20 MG capsule TAKE 1 CAPSULE (20 MG TOTAL) BY MOUTH DAILY. 30 capsule 4 05/16/2014 at Unknown time  . polyvinyl alcohol (LIQUIFILM TEARS) 1.4 % ophthalmic solution Place 1 drop into both eyes as needed for dry eyes. 15 mL 0 Past Month at Unknown time  . warfarin (COUMADIN) 10 MG tablet Take 1 tablet (10 mg total) by mouth daily at 6 PM. 30 tablet 0 05/17/2014 at Unknown time  . atorvastatin (LIPITOR) 20 MG tablet Take 1 tablet (20 mg total) by mouth daily at 6 PM. 30 tablet 0 05/15/2014  . benzonatate (TESSALON) 100 MG capsule TAKE 1 CAPSULE (100 MG TOTAL) BY MOUTH 2 (TWO) TIMES DAILY. (Patient not taking: Reported on 05/17/2014) 20 capsule 0 Not Taking at Unknown time  . diltiazem (CARDIZEM CD) 300 MG 24 hr capsule Take 1 capsule (300 mg total) by mouth daily. (Patient not taking: Reported on 05/17/2014)   Not Taking at  Unknown time  . glucose blood test strip Use to check BG daily 100 each 2 unknown  . ONETOUCH DELICA LANCETS 43X MISC 1 each by Does not apply route daily. Use one lancet to check BG daily 100 each 2 unknown    Assessment: 81 YOM who presented with SOB, hypotension and bradycardia and intubated in ED. Patient has a history of Atrial fibrillation and was supposed to be on Coumadin prior to admission. His INR on admission was sub-therapeutic at 1.74 likely due to non-compliance. Pharmacy consulted to resume his Coumadin therapy. Currently not on any medications that interact with Coumadin but is on subQ heparin  His INR today has trended up to 2.37 (therapeutic) after only one dose of Coumadin likely due to NPO and critically ill status. Anticipate INR to continue trending up   Home Coumadin dose (per clinic notes): 7.5 mg daily except 10 mg on Mon/Fri.   Goal of Therapy:  INR 2-3 Monitor platelets by anticoagulation protocol: Yes   Plan:  -Give Coumadin 5 mg x 1 dose today to avoid sharp increases in INR  -Monitor daily PT/INR -Re-educate on Coumadin and stress importance of compliance  -Stop subQ heparin since INR > 2   Albertina Parr, PharmD., BCPS Clinical Pharmacist Pager [phone]

## 2014-05-18 NOTE — Progress Notes (Signed)
SUBJECTIVE:  He is doing well and denies chest pain or SOB   PHYSICAL EXAM Filed Vitals:   05/18/14 1000 05/18/14 1100 05/18/14 1200 05/18/14 1215  BP: 109/62 108/58 109/62   Pulse: 102 137    Temp:    97.6 F (36.4 C)  TempSrc:    Oral  Resp:      Height:      Weight:      SpO2: 92% 93% 93%    General:  No distress Lungs:  Clear Heart:  Irregular Abdomen:  Positive bowel sounds, no rebound no guarding Extremities:  No edema   LABS:  Results for orders placed or performed during the hospital encounter of 05/16/14 (from the past 24 hour(s))  Basic metabolic panel     Status: Abnormal   Collection Time: 05/17/14  3:35 PM  Result Value Ref Range   Sodium 143 135 - 145 mmol/L   Potassium 4.5 3.5 - 5.1 mmol/L   Chloride 96 (L) 101 - 111 mmol/L   CO2 37 (H) 22 - 32 mmol/L   Glucose, Bld 116 (H) 65 - 99 mg/dL   BUN 41 (H) 6 - 20 mg/dL   Creatinine, Ser 1.86 (H) 0.61 - 1.24 mg/dL   Calcium 9.0 8.9 - 10.3 mg/dL   GFR calc non Af Amer 37 (L) >60 mL/min   GFR calc Af Amer 43 (L) >60 mL/min   Anion gap 10 5 - 15  Glucose, capillary     Status: None   Collection Time: 05/17/14  4:25 PM  Result Value Ref Range   Glucose-Capillary 96 65 - 99 mg/dL  Glucose, capillary     Status: None   Collection Time: 05/17/14  7:55 PM  Result Value Ref Range   Glucose-Capillary 94 65 - 99 mg/dL  Basic metabolic panel     Status: Abnormal   Collection Time: 05/17/14 11:01 PM  Result Value Ref Range   Sodium 142 135 - 145 mmol/L   Potassium 4.7 3.5 - 5.1 mmol/L   Chloride 94 (L) 101 - 111 mmol/L   CO2 38 (H) 22 - 32 mmol/L   Glucose, Bld 117 (H) 65 - 99 mg/dL   BUN 42 (H) 6 - 20 mg/dL   Creatinine, Ser 1.59 (H) 0.61 - 1.24 mg/dL   Calcium 8.8 (L) 8.9 - 10.3 mg/dL   GFR calc non Af Amer 45 (L) >60 mL/min   GFR calc Af Amer 52 (L) >60 mL/min   Anion gap 10 5 - 15  Glucose, capillary     Status: Abnormal   Collection Time: 05/18/14 12:04 AM  Result Value Ref Range   Glucose-Capillary 112 (H) 65 - 99 mg/dL   Comment 1 Notify RN   Glucose, capillary     Status: None   Collection Time: 05/18/14  4:01 AM  Result Value Ref Range   Glucose-Capillary 88 65 - 99 mg/dL  Renal function panel     Status: Abnormal   Collection Time: 05/18/14  4:55 AM  Result Value Ref Range   Sodium 144 135 - 145 mmol/L   Potassium 5.0 3.5 - 5.1 mmol/L   Chloride 98 (L) 101 - 111 mmol/L   CO2 39 (H) 22 - 32 mmol/L   Glucose, Bld 95 65 - 99 mg/dL   BUN 39 (H) 6 - 20 mg/dL   Creatinine, Ser 1.48 (H) 0.61 - 1.24 mg/dL   Calcium 8.8 (L) 8.9 - 10.3 mg/dL   Phosphorus 5.2 (H) 2.5 -  4.6 mg/dL   Albumin 3.0 (L) 3.5 - 5.0 g/dL   GFR calc non Af Amer 49 (L) >60 mL/min   GFR calc Af Amer 57 (L) >60 mL/min   Anion gap 7 5 - 15  Protime-INR     Status: Abnormal   Collection Time: 05/18/14  6:46 AM  Result Value Ref Range   Prothrombin Time 26.1 (H) 11.6 - 15.2 seconds   INR 2.37 (H) 0.00 - 1.88  Basic metabolic panel     Status: Abnormal   Collection Time: 05/18/14  6:46 AM  Result Value Ref Range   Sodium 145 135 - 145 mmol/L   Potassium 4.1 3.5 - 5.1 mmol/L   Chloride 96 (L) 101 - 111 mmol/L   CO2 40 (H) 22 - 32 mmol/L   Glucose, Bld 95 65 - 99 mg/dL   BUN 36 (H) 6 - 20 mg/dL   Creatinine, Ser 1.49 (H) 0.61 - 1.24 mg/dL   Calcium 8.9 8.9 - 10.3 mg/dL   GFR calc non Af Amer 49 (L) >60 mL/min   GFR calc Af Amer 56 (L) >60 mL/min   Anion gap 9 5 - 15  Glucose, capillary     Status: None   Collection Time: 05/18/14  8:21 AM  Result Value Ref Range   Glucose-Capillary 89 65 - 99 mg/dL    Intake/Output Summary (Last 24 hours) at 05/18/14 1235 Last data filed at 05/18/14 1100  Gross per 24 hour  Intake   1165 ml  Output   4085 ml  Net  -2920 ml    ASSESSMENT AND PLAN:  Acute hypoxic respiratory failure:   Extubated yesterday.   Continues to diurese without diuretic.    Acute on chronic renal failure:  One dialysis session for hyperkalemia.  Creat is stable    Atrial fib: Restarted warfarin.  Beta blocker started today.    Hypotension:  Improved.      William Flores 05/18/2014 12:35 PM

## 2014-05-19 ENCOUNTER — Encounter (HOSPITAL_COMMUNITY): Payer: Self-pay | Admitting: Cardiology

## 2014-05-19 DIAGNOSIS — J9621 Acute and chronic respiratory failure with hypoxia: Principal | ICD-10-CM

## 2014-05-19 DIAGNOSIS — I482 Chronic atrial fibrillation: Secondary | ICD-10-CM

## 2014-05-19 DIAGNOSIS — Z7901 Long term (current) use of anticoagulants: Secondary | ICD-10-CM

## 2014-05-19 DIAGNOSIS — Z5181 Encounter for therapeutic drug level monitoring: Secondary | ICD-10-CM

## 2014-05-19 DIAGNOSIS — I509 Heart failure, unspecified: Secondary | ICD-10-CM

## 2014-05-19 LAB — BASIC METABOLIC PANEL
ANION GAP: 7 (ref 5–15)
BUN: 32 mg/dL — ABNORMAL HIGH (ref 6–20)
CALCIUM: 8.9 mg/dL (ref 8.9–10.3)
CHLORIDE: 98 mmol/L — AB (ref 101–111)
CO2: 39 mmol/L — AB (ref 22–32)
Creatinine, Ser: 1.31 mg/dL — ABNORMAL HIGH (ref 0.61–1.24)
GFR calc Af Amer: >60 mL/min (ref >60)
GFR calc non Af Amer: 57 mL/min — ABNORMAL LOW (ref >60)
Glucose, Bld: 91 mg/dL (ref 65–99)
POTASSIUM: 3.9 mmol/L (ref 3.5–5.1)
SODIUM: 144 mmol/L (ref 135–145)

## 2014-05-19 LAB — GLUCOSE, CAPILLARY
GLUCOSE-CAPILLARY: 89 mg/dL (ref 65–99)
GLUCOSE-CAPILLARY: 101 mg/dL — AB (ref 65–99)
GLUCOSE-CAPILLARY: 112 mg/dL — AB (ref 65–99)
GLUCOSE-CAPILLARY: 151 mg/dL — AB (ref 65–99)

## 2014-05-19 LAB — PROTIME-INR
INR: 2.24 — ABNORMAL HIGH (ref 0.00–1.49)
PROTHROMBIN TIME: 25 s — AB (ref 11.6–15.2)

## 2014-05-19 MED ORDER — WARFARIN SODIUM 7.5 MG PO TABS
7.5000 mg | ORAL_TABLET | Freq: Once | ORAL | Status: AC
  Administered 2014-05-19: 7.5 mg via ORAL
  Filled 2014-05-19: qty 1

## 2014-05-19 MED ORDER — FUROSEMIDE 40 MG PO TABS
40.0000 mg | ORAL_TABLET | Freq: Every day | ORAL | Status: DC
  Administered 2014-05-19 – 2014-05-22 (×4): 40 mg via ORAL
  Filled 2014-05-19 (×4): qty 1

## 2014-05-19 MED ORDER — FERROUS SULFATE 325 (65 FE) MG PO TABS
325.0000 mg | ORAL_TABLET | Freq: Every day | ORAL | Status: DC
  Administered 2014-05-20 – 2014-05-22 (×3): 325 mg via ORAL
  Filled 2014-05-19 (×5): qty 1

## 2014-05-19 MED ORDER — ATORVASTATIN CALCIUM 20 MG PO TABS
20.0000 mg | ORAL_TABLET | Freq: Every day | ORAL | Status: DC
  Administered 2014-05-19 – 2014-05-21 (×3): 20 mg via ORAL
  Filled 2014-05-19 (×4): qty 1

## 2014-05-19 MED ORDER — SODIUM CHLORIDE 0.9 % IJ SOLN: 10.0000 mL | INTRAMUSCULAR | Status: DC | PRN

## 2014-05-19 MED ORDER — ENSURE ENLIVE PO LIQD
237.0000 mL | ORAL | Status: DC
Start: 2014-05-19 — End: 2014-05-22
  Administered 2014-05-19 – 2014-05-21 (×3): 237 mL via ORAL

## 2014-05-19 MED ORDER — DILTIAZEM HCL 90 MG PO TABS
90.0000 mg | ORAL_TABLET | Freq: Three times a day (TID) | ORAL | Status: DC
  Administered 2014-05-19 – 2014-05-20 (×3): 90 mg via ORAL
  Filled 2014-05-19 (×6): qty 1

## 2014-05-19 MED ORDER — ALLOPURINOL 100 MG PO TABS
100.0000 mg | ORAL_TABLET | Freq: Every day | ORAL | Status: DC
  Administered 2014-05-19 – 2014-05-22 (×4): 100 mg via ORAL
  Filled 2014-05-19 (×4): qty 1

## 2014-05-19 NOTE — Progress Notes (Signed)
Patient states that he has home oxygen already, and has received home services from [facility] and would like to resume with them if needed.

## 2014-05-19 NOTE — Evaluation (Signed)
Physical Therapy Evaluation Patient Details Name: William Flores MRN: [id number] DOB: [birth year] Today's Date: 05/19/2014   History of Present Illness  Patient is a 64 y/o male admitted with acute hypoxic respiratory failure.  PMH positive for A-fib, dCHF, OSA and CRF.  Clinical Impression  Patient presents with decreased independence with mobility.  Recent stays at [facility] rehab so pt feels he needs to be ready to go back home instead of back to rehab.  Close to mod I level with mobility, but fatigues fast and as of right not not showing interest in self care tasks.  Recommend continued skilled PT during acute stay with nursing assist for frequent mobility tasks and OT (already ordered) for work on self care skills to maximize independence and allow pt to d/c home with [place], [place], and aide.    Follow Up Recommendations Home health PT;Supervision - Intermittent    Equipment Recommendations  Rolling walker with 5" wheels (rollator walker for home (4 wheels and a seat))    Recommendations for Other Services       Precautions / Restrictions Precautions Precautions: Fall Precaution Comments: watch O2 sats      Mobility  Bed Mobility Overal bed mobility: Needs Assistance Bed Mobility: Supine to Sit     Supine to sit: Supervision;HOB elevated     General bed mobility comments: heavy use of bed rail  Transfers Overall transfer level: Needs assistance Equipment used: Rolling walker (2 wheeled) Transfers: Sit to/from Stand Sit to Stand: Min guard         General transfer comment: assist for safety  Ambulation/Gait Ambulation/Gait assistance: Min guard;Supervision Ambulation Distance (Feet): 70 Feet Assistive device: Rolling walker (2 wheeled) Gait Pattern/deviations: Step-through pattern;Decreased stride length     General Gait Details: c/o fatigue, SpO2 down to 83% ambulating on 2L O2; back >90% in about 3 minutes rest  Stairs            Wheelchair Mobility     Modified Rankin (Stroke Patients Only)       Balance Overall balance assessment: Needs assistance         Standing balance support: Bilateral upper extremity supported Standing balance-Leahy Scale: Poor Standing balance comment: demonstrates decreased balance requiring UE support on walker to stand                             Pertinent Vitals/Pain Pain Assessment: No/denies pain    Home Living Family/patient expects to be discharged to:: Private residence Living Arrangements: Alone   Type of Home: House Home Access: Stairs to enter Entrance Stairs-Rails: Chemical engineer of Steps: 3 Home Layout: One level Home Equipment: [place] - single point;Bedside commode;Shower seat;Walker - standard (reports walker is only and heavy with no wheels)      Prior Function Level of Independence: Independent with assistive device(s)         Comments: Was just here last month, then to rehab about a week, then had to go home     Hand Dominance   Dominant Hand: Right    Extremity/Trunk Assessment               Lower Extremity Assessment: Generalized weakness         Communication   Communication: No difficulties  Cognition Arousal/Alertness: Awake/alert Behavior During Therapy: WFL for tasks assessed/performed Overall Cognitive Status: Within Functional Limits for tasks assessed  General Comments General comments (skin integrity, edema, etc.): pt with BM sitting on BSC, upon standing leaned over for me to assist with hygiene without attempting himself    Exercises        Assessment/Plan    PT Assessment Patient needs continued PT services  PT Diagnosis Abnormality of gait;Generalized weakness   PT Problem List Decreased strength;Decreased mobility;Decreased balance;Decreased activity tolerance;Decreased knowledge of use of DME  PT Treatment Interventions DME instruction;Therapeutic exercise;Gait  training;Balance training;Functional mobility training;Therapeutic activities;Patient/family education;Stair training   PT Goals (Current goals can be found in the Care Plan section) Acute Rehab PT Goals Patient Stated Goal: To go home PT Goal Formulation: With patient Time For Goal Achievement: 05/26/14 Potential to Achieve Goals: Good    Frequency Min 3X/week   Barriers to discharge Decreased caregiver support      Co-evaluation               End of Session Equipment Utilized During Treatment: Gait belt;Oxygen Activity Tolerance: Patient limited by fatigue Patient left: in chair;with call bell/phone within reach Nurse Communication: Mobility status         Time: 9295-7473 PT Time Calculation (min) (ACUTE ONLY): 29 min   Charges:   PT Evaluation $Initial PT Evaluation Tier I: 1 Procedure PT Treatments $Gait Training: 8-22 mins   PT G Codes:        WYNN,CYNDI 24-May-2014, 1:50 PM  Magda Kiel, [place] 05-24-2014

## 2014-05-19 NOTE — Clinical Social Work Note (Signed)
Clinical Social Worker received referral for possible ST-SNF placement.  Chart reviewed.  PT/OT recommending home with home health and intermittent supervision.  Spoke with RN Case Manager who will follow up with patient to discuss home health needs.    CSW signing off - please re consult if social work needs arise.  Barbette Or, [place]

## 2014-05-19 NOTE — Progress Notes (Signed)
Nutrition Follow-up  DOCUMENTATION CODES:  Obesity unspecified  INTERVENTION:  Other (Comment), Ensure Enlive (each supplement provides 350kcal and 20 grams of protein) (Encourage PO) Daily  NUTRITION DIAGNOSIS:  Inadequate oral intake related to inability to eat as evidenced by NPO status.  Resolved  No new nutrition diagnosis at this time  GOAL:  Patient will meet greater than or equal to 90% of their needs  met  MONITOR:  PO intake, Diet advancement, Labs, Weight trends  REASON FOR ASSESSMENT:  Other (Comment) (Follow-up/change of status) Enteral/tube feeding initiation and management  ASSESSMENT: Patient presented to the [facility] ED on 5/13 with SOB, hypotension, and bradycardia. Failed BiPAP and required intubation in the ED. Admitted with hypoxic respiratory failure. Pt was extubated 5/14.  Pt has been consuming 75 -100% of his meals since being on the diet (start 5/15). Pt states his appetite is good, but there is not enough food on his trays to satisfy his hunger. Weight history shows pt loosing 25 Lb since admission, per Nephrology he has been diuresed extensively, I/Os shows negative 9 L. Pt likes Ensures during previous admission, will order once daily. Will continue to monitor Labs reviewed: BUN 32, Cr 1.31, Glu 101    Height:  Ht Readings from Last 1 Encounters:  05/16/14 _0  (1.702 m)    Weight:  Wt Readings from Last 1 Encounters:  05/19/14 265 lb 4.8 oz (120.339 kg)    Ideal Body Weight:  67.3 kg  Wt Readings from Last 10 Encounters:  05/19/14 265 lb 4.8 oz (120.339 kg)  04/23/14 282 lb 13.6 oz (128.3 kg)  03/20/14 268 lb (121.564 kg)  03/19/14 266 lb (120.657 kg)  02/26/14 261 lb 14.4 oz (118.797 kg)  02/10/14 294 lb 12.8 oz (133.72 kg)  12/19/13 284 lb (128.822 kg)  11/29/13 282 lb (127.914 kg)  11/21/13 273 lb 14.4 oz (124.24 kg)  11/19/13 273 lb 6.4 oz (124.013 kg)    BMI:  Body mass index is 41.54 kg/(m^2).  Estimated Nutritional  Needs:  Kcal:  2000 - 2200  Protein:  90 - 105 g  Fluid:  per MD  Skin:  Reviewed, no issues  Diet Order:  Diet heart healthy/carb modified Room service appropriate?: Yes; Fluid consistency:: Thin  EDUCATION NEEDS:  Education needs addressed   Intake/Output Summary (Last 24 hours) at 05/19/14 1605 Last data filed at 05/19/14 1308  Gross per 24 hour  Intake    750 ml  Output    375 ml  Net    375 ml    Last BM:  5/13  Christohper Dube A. [facility] Dietetic Intern Pager: [phone] 05/19/2014 4:05 PM

## 2014-05-19 NOTE — Progress Notes (Signed)
Occupational Therapy Evaluation Patient Details Name: William Flores MRN: [id number] DOB: [birth year] Today's Date: 05/19/2014    History of Present Illness Patient is a 64 y/o male admitted with acute hypoxic respiratory failure.  PMH positive for A-fib, dCHF, OSA and CRF.   Clinical Impression   PTA, pt mod I and lived at home alone. Pt currently requires S with mobility and min A with LB ADL. Feel pt will be ale to D/C home with [facility] services. Will follow acutely to maximize independence with ADL and functional mobility for ADL with use of energy conservation techniques, AE and DME.    Follow Up Recommendations  Home health OT    Equipment Recommendations  Other (comment) (rollator)    Recommendations for Other Services       Precautions / Restrictions Precautions Precautions: Fall Precaution Comments: watch O2 sats      Mobility Bed Mobility Overal bed mobility: Needs Assistance Bed Mobility: Supine to Sit;Sit to Supine     Supine to sit: Supervision Sit to supine: Supervision;HOB elevated   General bed mobility comments: use of bedrail  Transfers Overall transfer level: Needs assistance Equipment used: Rolling walker (2 wheeled) Transfers: Sit to/from [facility] Sit to Stand: Supervision Stand pivot transfers: Supervision       General transfer comment: assist for safety    Balance Overall balance assessment: Needs assistance   Sitting balance-Leahy Scale: Fair     Standing balance support: During functional activity;Single extremity supported Standing balance-Leahy Scale: Fair Standing balance comment: demonstrates decreased balance requiring UE support on walker to stand                            ADL Overall ADL's : Needs assistance/impaired     Grooming: Set up;Standing   Upper Body Bathing: Set up;Standing   Lower Body Bathing: Minimal assistance;Sit to/from stand   Upper Body Dressing : Set up;Standing    Lower Body Dressing: Minimal assistance;Sit to/from stand   Toilet Transfer: Min guard;Ambulation;BSC   Toileting- Water quality scientist and Hygiene: Supervision/safety;Set up     Tub/Shower Transfer Details (indicate cue type and reason): Pt states he only showers 1-2 times/month Functional mobility during ADLs: Min guard;Rolling walker General ADL Comments: Pt would benefit from energy conservation techniques adn use of reacher for ADL                     Pertinent Vitals/Pain Pain Assessment: No/denies pain     Hand Dominance Right   Extremity/Trunk Assessment Upper Extremity Assessment Upper Extremity Assessment: RUE deficits/detail;Generalized weakness RUE Deficits / Details: c/o R shoulder pain from arthritis   Lower Extremity Assessment Lower Extremity Assessment: Defer to PT evaluation   Cervical / Trunk Assessment Cervical / Trunk Assessment: Normal   Communication Communication Communication: No difficulties   Cognition Arousal/Alertness: Awake/alert Behavior During Therapy: WFL for tasks assessed/performed Overall Cognitive Status: Within Functional Limits for tasks assessed                     General Comments                    Home Living Family/patient expects to be discharged to:: Private residence Living Arrangements: Alone Available Help at Discharge: Friend(s);Available PRN/intermittently (can come over daily) Type of Home: House Home Access: Stairs to enter Entrance Stairs-Number of Steps: 3 Entrance Stairs-Rails: Left;Right Home Layout: One level     Bathroom  Shower/Tub: Tub/shower unit;Curtain Shower/tub characteristics: Architectural technologist: Standard Bathroom Accessibility: Yes How Accessible: Accessible via walker Home Equipment: [place] - single point;Bedside commode;Shower seat;Walker - standard          Prior Functioning/Environment Level of Independence: Independent with assistive device(s)         Comments: was previously at [facility] in [place]    OT Diagnosis: Generalized weakness   OT Problem List: Decreased strength;Decreased activity tolerance;Decreased knowledge of use of DME or AE;Cardiopulmonary status limiting activity;Obesity   OT Treatment/Interventions: Self-care/ADL training;Therapeutic exercise;Energy conservation;DME and/or AE instruction;Therapeutic activities;Patient/family education    OT Goals(Current goals can be found in the care plan section) Acute Rehab OT Goals Patient Stated Goal: to be stronger OT Goal Formulation: With patient Time For Goal Achievement: 06/02/14 Potential to Achieve Goals: Good  OT Frequency: Min 2X/week   Barriers to D/C: Decreased caregiver support          Co-evaluation              End of Session Equipment Utilized During Treatment: Rolling walker;Oxygen (2L) Nurse Communication: Mobility status  Activity Tolerance: Patient tolerated treatment well Patient left: in bed;with call bell/phone within reach;with family/visitor present   Time: 1655-1720 OT Time Calculation (min): 25 min Charges:  OT General Charges $OT Visit: 1 Procedure OT Evaluation $Initial OT Evaluation Tier I: 1 Procedure OT Treatments $Self Care/Home Management : 8-22 mins G-Codes:    Altha Sweitzer,[place] 05-26-2014, 5:39 PM   [facility], OTR/L  [phone] 05-26-2014

## 2014-05-19 NOTE — Progress Notes (Signed)
[facility] PROGRESS NOTE  William Flores XFG:[id number] DOB: Jun 28, 1950 DOA: 05/16/2014 PCP: Redge Gainer, MD  Assessment/Plan:  Principal Problem:   Acute respiratory failure with hypoxia secondary to CHF, OSA improved. Lasix per cardiology Active Problems:   DM type 2, uncontrolled, with renal complications: improved   Morbid obesity   Chronic atrial fibrillation   OSA (obstructive sleep apnea): Has not worn his C Pap machine at home for several years. Reports is broken. Has tolerated BiPAP nightly here. Will ask care manager to assist with arranging new CPAP machine   Anticoagulated on Coumadin   Hyperkalemia s/p HD   AKI (acute kidney injury) much improved   Shock circulatory, resolved. Cardiac meds being resumed carefully. Discontinue central line   Acute on chronic Diastolic CHF (congestive heart failure) Anemia: Monitor hemoglobin  Code Status:  full Family Communication:   Disposition Plan:  Home with home health 1-2 days if ok with cardiolgy  HPI/Subjective: Feels much better. Does feel dyspneic and weak on exertion with orthostasis. Was able to walk with physical therapy. Lives alone with family and friends checking on him periodically.  Objective: Filed Vitals:   05/19/14 1021  BP: 132/66  Pulse: 94  Temp:   Resp:     Intake/Output Summary (Last 24 hours) at 05/19/14 1158 Last data filed at 05/19/14 0934  Gross per 24 hour  Intake    550 ml  Output   1100 ml  Net   -550 ml   Filed Weights   05/17/14 0454 05/18/14 0500 05/19/14 0415  Weight: 125.1 kg (275 lb 12.7 oz) 119.1 kg (262 lb 9.1 oz) 120.339 kg (265 lb 4.8 oz)    Exam:   General:  In recliner. Breathing nonlabored. Alert and oriented. Appears pale  HEENT: Pale conjunctiva. Moist mucous membranes.  Cardiovascular: Irregularly irregular without murmurs gallops rubs  Respiratory: Clear to auscultation bilaterally without wheezes rhonchi or rales  Abdomen: Soft nontender  nondistended  Ext: Trace edema  Basic Metabolic Panel:  Recent Labs Lab 05/16/14 1550  05/17/14 0245  05/17/14 1535 05/17/14 2301 05/18/14 0455 05/18/14 0646 05/19/14 0525  NA 141  < > 141  < > 143 142 144 145 144  K 6.8*  < > 4.9  < > 4.5 4.7 5.0 4.1 3.9  CL 103  < > 96*  < > 96* 94* 98* 96* 98*  CO2 29  < > 33*  < > 37* 38* 39* 40* 39*  GLUCOSE 242*  < > 226*  < > 116* 117* 95 95 91  BUN 40*  < > 38*  < > 41* 42* 39* 36* 32*  CREATININE 2.14*  < > 1.88*  < > 1.86* 1.59* 1.48* 1.49* 1.31*  CALCIUM 9.8  < > 9.3  < > 9.0 8.8* 8.8* 8.9 8.9  MG  --   --  1.5*  --   --   --   --   --   --   PHOS 2.8  --  3.1  --   --   --  5.2*  --   --   < > = values in this interval not displayed. Liver Function Tests:  Recent Labs Lab 05/16/14 1550 05/18/14 0455  ALBUMIN 3.3* 3.0*   No results for input(s): LIPASE, AMYLASE in the last 168 hours. No results for input(s): AMMONIA in the last 168 hours. CBC:  Recent Labs Lab 05/16/14 0618 05/16/14 0640 05/17/14 0500  WBC 8.3  --  6.2  NEUTROABS  5.6  --   --   HGB 9.6* 12.2* 9.7*  HCT 35.0* 36.0* 32.4*  MCV 93.3  --  86.4  PLT 178  --  174   Cardiac Enzymes: No results for input(s): CKTOTAL, CKMB, CKMBINDEX, TROPONINI in the last 168 hours. BNP (last 3 results)  Recent Labs  04/14/14 0222 05/16/14 0618 05/17/14 0457  BNP 333.2* 147.4* 320.6*    ProBNP (last 3 results)  Recent Labs  08/05/13 1403 10/28/13 1746  PROBNP 376.6* 325.7*    CBG:  Recent Labs Lab 05/18/14 1217 05/18/14 1609 05/18/14 1729 05/18/14 2153 05/19/14 0750  GLUCAP 135* 127* 121* 140* 89    Recent Results (from the past 240 hour(s))  MRSA PCR Screening     Status: None   Collection Time: 05/16/14 10:30 AM  Result Value Ref Range Status   MRSA by PCR NEGATIVE NEGATIVE Final    Comment:        The GeneXpert MRSA Assay (FDA approved for NASAL specimens only), is one component of a comprehensive MRSA colonization surveillance  program. It is not intended to diagnose MRSA infection nor to guide or monitor treatment for MRSA infections.      Studies: No results found.  Scheduled Meds: . antiseptic oral rinse  7 mL Mouth Rinse q12n4p  . diltiazem  90 mg Oral 3 times per day  . furosemide  40 mg Oral Daily  . insulin aspart  2-6 Units Subcutaneous TID AC & HS  . metoprolol tartrate  25 mg Oral BID  . warfarin  7.5 mg Oral ONCE-1800  . Warfarin - Pharmacist Dosing Inpatient   Does not apply q1800   Continuous Infusions:   Time spent: 25 minutes  [facility] Pager [phone], password [facility] 05/19/2014, 11:58 AM  LOS: 3 days

## 2014-05-19 NOTE — Progress Notes (Signed)
64 y/o man admitted with Chronic DHF, CAF, OSA/ OHS admitted with  Acute Hypoxic Resp Failure - (OSA-Obesity Hypoventilation with Acute Diastolic HF) - initially intubated & IV diuresis. BP meds held due to hypotension. Extubated 2 d ago & off IV lasix. 1 run of HD for HyperKalemia  BB restarted yesterday & transferred from ICU  SUBJECTIVE:   He is doing well and denies chest pain or SOB O2 reduced today -- is getting ready to work with PT/OT   PHYSICAL EXAM Filed Vitals:   05/18/14 1644 05/18/14 2156 05/19/14 0415 05/19/14 1021  BP: 132/75 129/65 121/67 132/66  Pulse: 85 88  94  Temp: 98.2 F (36.8 C) 98.4 F (36.9 C) 98.6 F (37 C)   TempSrc: Oral Oral Oral   Resp: 22 16 18    Height:      Weight:   120.339 kg (265 lb 4.8 oz)   SpO2: 98% 95% 99% 93%   General:  No distress lying in bed; morbidly obese Lungs:  CTAB, non-labered, NO W/R/R Heart:  Irreg Irreg, ~ tachy 90s-100s, NO obvious M/R/G Abdomen:  Positive bowel sounds, no rebound no guarding; obese Extremities:  No edema Neuro: grossly normal Psych: somewhat flat affect, but normal mood.  LABS:  Results for orders placed or performed during the hospital encounter of 05/16/14 (from the past 24 hour(s))  Glucose, capillary     Status: Abnormal   Collection Time: 05/18/14 12:17 PM  Result Value Ref Range   Glucose-Capillary 135 (H) 65 - 99 mg/dL  Glucose, capillary     Status: Abnormal   Collection Time: 05/18/14  4:09 PM  Result Value Ref Range   Glucose-Capillary 127 (H) 65 - 99 mg/dL  Glucose, capillary     Status: Abnormal   Collection Time: 05/18/14  5:29 PM  Result Value Ref Range   Glucose-Capillary 121 (H) 65 - 99 mg/dL  Glucose, capillary     Status: Abnormal   Collection Time: 05/18/14  9:53 PM  Result Value Ref Range   Glucose-Capillary 140 (H) 65 - 99 mg/dL   Comment 1 Notify RN   Protime-INR     Status: Abnormal   Collection Time: 05/19/14  5:25 AM  Result Value Ref Range   Prothrombin  Time 25.0 (H) 11.6 - 15.2 seconds   INR 2.24 (H) 0.00 - 0.34  Basic metabolic panel     Status: Abnormal   Collection Time: 05/19/14  5:25 AM  Result Value Ref Range   Sodium 144 135 - 145 mmol/L   Potassium 3.9 3.5 - 5.1 mmol/L   Chloride 98 (L) 101 - 111 mmol/L   CO2 39 (H) 22 - 32 mmol/L   Glucose, Bld 91 65 - 99 mg/dL   BUN 32 (H) 6 - 20 mg/dL   Creatinine, Ser 1.31 (H) 0.61 - 1.24 mg/dL   Calcium 8.9 8.9 - 10.3 mg/dL   GFR calc non Af Amer 57 (L) >60 mL/min   GFR calc Af Amer >60 >60 mL/min   Anion gap 7 5 - 15  Glucose, capillary     Status: None   Collection Time: 05/19/14  7:50 AM  Result Value Ref Range   Glucose-Capillary 89 65 - 99 mg/dL    Intake/Output Summary (Last 24 hours) at 05/19/14 1052 Last data filed at 05/19/14 0934  Gross per 24 hour  Intake    550 ml  Output   1325 ml  Net   -775 ml    ASSESSMENT  AND PLAN: Principal Problem:   Acute respiratory failure with hypoxia Active Problems:   Acute exacerbation of CHF (congestive heart failure)   Morbid obesity   Chronic atrial fibrillation - rate controlled (on warfarin)   OSA (obstructive sleep apnea) - Obesity Hypoventilation    Acute on chronic respiratory failure with hypoxemia - combined DHF & Obesity Hypoventillation   DM type 2, uncontrolled, with renal complications   Hyperkalemia - resolved *1 run of HD)   AKI (acute kidney injury)   Shock circulatory - resolved   Acute renal failure syndrome  Recommend: Acute hypoxic respiratory failure:   Extubated 2 days ago.   Doing much better now.  Acute Diastolic HF (HFPF - EF ~57%):  Continued to diurese without diuretic overnight - has slowed today.  Would restart home Lasix PO  Back on BB - ACE-I not restarted (partiall related to AKI & HyperK) - would hold off on restarting until K & Cr are stable @ baseline  Acute on chronic renal failure:  One dialysis session for hyperkalemia.  Creat is stable   Atrial fib: Restarted warfarin.  Beta  blocker started yesterday -- suspect that we can gradually restart CCB dose (was on 389m XL Diltiazem) - consider 90 mg po tid - to restart & if tolerated - can return to home dose for d/c.    Hypotension:  Improved.  Gradually titrate back on HF meds.    HAspers DAVID W 05/19/2014 10:52 AM

## 2014-05-19 NOTE — Progress Notes (Signed)
Utilization Review completed. Adaora Mchaney RN BSN CM 

## 2014-05-19 NOTE — Progress Notes (Signed)
ANTICOAGULATION CONSULT NOTE - Follow Up Consult  Pharmacy Consult for Warfarin Indication: atrial fibrillation  Allergies  Allergen Reactions  . Codeine     Headache     Patient Measurements: Height: 5' 7"  (170.2 cm) Weight: 265 lb 4.8 oz (120.339 kg) IBW/kg (Calculated) : 66.1  Vital Signs: Temp: 98.6 F (37 C) (05/16 0415) Temp Source: Oral (05/16 0415) BP: 121/67 mmHg (05/16 0415) Pulse Rate: 88 (05/15 2156)  Labs:  Recent Labs  05/17/14 0500  05/18/14 0455 05/18/14 0646 05/19/14 0525  HGB 9.7*  --   --   --   --   HCT 32.4*  --   --   --   --   PLT 174  --   --   --   --   LABPROT  --   --   --  26.1* 25.0*  INR  --   --   --  2.37* 2.24*  CREATININE  --   < > 1.48* 1.49* 1.31*  < > = values in this interval not displayed.  Estimated Creatinine Clearance: 72.6 mL/min (by C-G formula based on Cr of 1.31).   Medical History: Past Medical History  Diagnosis Date  . Diabetes   . Colon cancer   . Hypertension   . Sleep apnea   . Atrial fibrillation   . Hyperlipidemia   . CHF (congestive heart failure)   . Colon polyps 09/21/2012    Tubular adenoma    Medications:  Prescriptions prior to admission  Medication Sig Dispense Refill Last Dose  . allopurinol (ZYLOPRIM) 100 MG tablet Take 100 mg by mouth daily.  3 05/16/2014 at Unknown time  . azelastine (OPTIVAR) 0.05 % ophthalmic solution Place 2 drops into both eyes 2 (two) times daily. (Patient taking differently: Place 2 drops into both eyes daily as needed. ) 6 mL 12 Past Week at Unknown time  . benzonatate (TESSALON) 100 MG capsule Take 1 capsule (100 mg total) by mouth 2 (two) times daily as needed for cough. 20 capsule 0 Past Week at Unknown time  . diltiazem (CARDIZEM CD) 360 MG 24 hr capsule Take 360 mg by mouth daily.  5 05/16/2014 at Unknown time  . ferrous sulfate 325 (65 FE) MG tablet Take 1 tablet (325 mg total) by mouth 2 (two) times daily with a meal.  3 05/16/2014 at Unknown time  . furosemide  (LASIX) 40 MG tablet Take 40 mg by mouth daily.  3 05/16/2014 at Unknown time  . insulin aspart (NOVOLOG) 100 UNIT/ML injection Inject 0-9 Units into the skin 3 (three) times daily with meals. Sliding scale CBG 70 - 120: 0 units CBG 121 - 150: 1 unit,  CBG 151 - 200: 2 units,  CBG 201 - 250: 3 units,  CBG 251 - 300: 5 units,  CBG 301 - 350: 7 units,  CBG 351 - 400: 9 units   CBG > 400: 9 units and notify your MD 10 mL 11 05/16/2014 at Unknown time  . KLOR-CON M20 20 MEQ tablet Take 10 mEq by mouth daily.  11 05/16/2014 at Unknown time  . lisinopril (PRINIVIL,ZESTRIL) 5 MG tablet Take 5 mg by mouth daily.  11 05/16/2014 at Unknown time  . metolazone (ZAROXOLYN) 2.5 MG tablet Take 2.5 mg by mouth 2 (two) times daily.   2 05/16/2014 at Unknown time  . metoprolol tartrate (LOPRESSOR) 25 MG tablet Take 1 tablet (25 mg total) by mouth every 8 (eight) hours. (Patient taking differently: Take 25  mg by mouth 2 (two) times daily. )   05/16/2014 at 0830  . omeprazole (PRILOSEC) 20 MG capsule TAKE 1 CAPSULE (20 MG TOTAL) BY MOUTH DAILY. 30 capsule 4 05/16/2014 at Unknown time  . polyvinyl alcohol (LIQUIFILM TEARS) 1.4 % ophthalmic solution Place 1 drop into both eyes as needed for dry eyes. 15 mL 0 Past Month at Unknown time  . warfarin (COUMADIN) 10 MG tablet Take 1 tablet (10 mg total) by mouth daily at 6 PM. 30 tablet 0 05/17/2014 at Unknown time  . atorvastatin (LIPITOR) 20 MG tablet Take 1 tablet (20 mg total) by mouth daily at 6 PM. 30 tablet 0 05/15/2014  . benzonatate (TESSALON) 100 MG capsule TAKE 1 CAPSULE (100 MG TOTAL) BY MOUTH 2 (TWO) TIMES DAILY. (Patient not taking: Reported on 05/17/2014) 20 capsule 0 Not Taking at Unknown time  . diltiazem (CARDIZEM CD) 300 MG 24 hr capsule Take 1 capsule (300 mg total) by mouth daily. (Patient not taking: Reported on 05/17/2014)   Not Taking at Unknown time  . glucose blood test strip Use to check BG daily 100 each 2 unknown  . ONETOUCH DELICA LANCETS 86L MISC 1 each by Does  not apply route daily. Use one lancet to check BG daily 100 each 2 unknown    Assessment: 30 YOM who presented with SOB, hypotension and bradycardia and intubated in ED. Patient has a history of Atrial fibrillation and was supposed to be on Coumadin prior to admission. His INR on admission was sub-therapeutic at 1.74 likely due to non-compliance. Pharmacy consulted to resume his Coumadin therapy.   His INR today is 2.24  Home Coumadin dose (per clinic notes): 7.5 mg daily except 10 mg on Mon/Fri.   Goal of Therapy:  INR 2-3 Monitor platelets by anticoagulation protocol: Yes   Plan:  -Give Coumadin 7.5 mg today -Monitor daily PT/INR -Re-educate on Coumadin and stress importance of compliance  Thanks for allowing pharmacy to be a part of this patient's care.  Excell Seltzer, PharmD Clinical Pharmacist, [phone]

## 2014-05-20 ENCOUNTER — Ambulatory Visit: Payer: BLUE CROSS/BLUE SHIELD | Admitting: Cardiovascular Disease

## 2014-05-20 DIAGNOSIS — I455 Other specified heart block: Secondary | ICD-10-CM

## 2014-05-20 LAB — BASIC METABOLIC PANEL
ANION GAP: 8 (ref 5–15)
BUN: 26 mg/dL — ABNORMAL HIGH (ref 6–20)
CHLORIDE: 98 mmol/L — AB (ref 101–111)
CO2: 36 mmol/L — AB (ref 22–32)
Calcium: 8.8 mg/dL — ABNORMAL LOW (ref 8.9–10.3)
Creatinine, Ser: 1.17 mg/dL (ref 0.61–1.24)
GFR calc Af Amer: >60 mL/min (ref >60)
GFR calc non Af Amer: >60 mL/min (ref >60)
Glucose, Bld: 93 mg/dL (ref 65–99)
Potassium: 4 mmol/L (ref 3.5–5.1)
SODIUM: 142 mmol/L (ref 135–145)

## 2014-05-20 LAB — GLUCOSE, CAPILLARY
GLUCOSE-CAPILLARY: 99 mg/dL (ref 65–99)
Glucose-Capillary: 138 mg/dL — ABNORMAL HIGH (ref 65–99)
Glucose-Capillary: 106 mg/dL — ABNORMAL HIGH (ref 65–99)
Glucose-Capillary: 168 mg/dL — ABNORMAL HIGH (ref 65–99)

## 2014-05-20 LAB — PROTIME-INR
INR: 1.88 — AB (ref 0.00–1.49)
Prothrombin Time: 21.7 seconds — ABNORMAL HIGH (ref 11.6–15.2)

## 2014-05-20 LAB — CBC
HCT: 33.8 % — ABNORMAL LOW (ref 39.0–52.0)
HEMOGLOBIN: 9.5 g/dL — AB (ref 13.0–17.0)
MCH: 25.8 pg — ABNORMAL LOW (ref 26.0–34.0)
MCHC: 28.1 g/dL — ABNORMAL LOW (ref 30.0–36.0)
MCV: 91.8 fL (ref 78.0–100.0)
Platelets: 136 10*3/uL — ABNORMAL LOW (ref 150–400)
RBC: 3.68 MIL/uL — ABNORMAL LOW (ref 4.22–5.81)
RDW: 19.8 % — AB (ref 11.5–15.5)
WBC: 5.2 10*3/uL (ref 4.0–10.5)

## 2014-05-20 LAB — MAGNESIUM: MAGNESIUM: 1.7 mg/dL (ref 1.7–2.4)

## 2014-05-20 MED ORDER — MAGNESIUM SULFATE 2 GM/50ML IV SOLN
2.0000 g | Freq: Once | INTRAVENOUS | Status: AC
  Administered 2014-05-20: 2 g via INTRAVENOUS
  Filled 2014-05-20: qty 50

## 2014-05-20 MED ORDER — WARFARIN SODIUM 10 MG PO TABS
10.0000 mg | ORAL_TABLET | Freq: Once | ORAL | Status: AC
  Administered 2014-05-20: 10 mg via ORAL
  Filled 2014-05-20: qty 1

## 2014-05-20 MED ORDER — DILTIAZEM HCL 60 MG PO TABS
60.0000 mg | ORAL_TABLET | Freq: Three times a day (TID) | ORAL | Status: DC
  Administered 2014-05-20 – 2014-05-22 (×6): 60 mg via ORAL
  Filled 2014-05-20 (×9): qty 1

## 2014-05-20 NOTE — Progress Notes (Signed)
Subjective: Feeling better.  A little dizzy when he stands up.   Objective: Vital signs in last 24 hours: Temp:  [98 F (36.7 C)-98.5 F (36.9 C)] 98.5 F (36.9 C) (05/17 0535) Pulse Rate:  [66-94] 66 (05/17 1013) Resp:  [18-20] 20 (05/17 0535) BP: (128-133)/(60-106) 133/70 mmHg (05/17 0535) SpO2:  [92 %-95 %] 95 % (05/17 0535) Weight:  [261 lb 12.8 oz (118.752 kg)] 261 lb 12.8 oz (118.752 kg) (05/17 0346) Last BM Date: 05/19/14  Intake/Output from previous day: 05/16 0701 - 05/17 0700 In: 1000 [P.O.:1000] Out: 825 [Urine:825] Intake/Output this shift: Total I/O In: 200 [P.O.:200] Out: -   Medications Scheduled Meds: . allopurinol  100 mg Oral Daily  . antiseptic oral rinse  7 mL Mouth Rinse q12n4p  . atorvastatin  20 mg Oral q1800  . diltiazem  60 mg Oral 3 times per day  . feeding supplement (ENSURE ENLIVE)  237 mL Oral Q24H  . ferrous sulfate  325 mg Oral Q breakfast  . furosemide  40 mg Oral Daily  . insulin aspart  2-6 Units Subcutaneous TID AC & HS  . magnesium sulfate 1 - 4 g bolus IVPB  2 g Intravenous Once  . metoprolol tartrate  25 mg Oral BID  . warfarin  10 mg Oral ONCE-1800  . Warfarin - Pharmacist Dosing Inpatient   Does not apply q1800   Continuous Infusions:  PRN Meds:.acetaminophen, docusate, ondansetron (ZOFRAN) IV, sodium chloride  PE: General appearance: alert, cooperative and no distress Lungs: clear to auscultation bilaterally Heart: irregularly irregular rhythm and No MRG Abdomen: +BS Extremities: Trace LEE Pulses: 2+ and symmetric Skin: Warm and dry Neurologic: Grossly normal  Lab Results:   Recent Labs  05/20/14 0442  WBC 5.2  HGB 9.5*  HCT 33.8*  PLT 136*   BMET  Recent Labs  05/18/14 0646 05/19/14 0525 05/20/14 0442  NA 145 144 142  K 4.1 3.9 4.0  CL 96* 98* 98*  CO2 40* 39* 36*  GLUCOSE 95 91 93  BUN 36* 32* 26*  CREATININE 1.49* 1.31* 1.17  CALCIUM 8.9 8.9 8.8*   PT/INR  Recent Labs  05/18/14 0646  05/19/14 0525 05/20/14 0442  LABPROT 26.1* 25.0* 21.7*  INR 2.37* 2.24* 1.88*   Assessment/Plan  64 y/o man admitted with Chronic DHF, CAF, OSA/ OHS admitted with Acute Hypoxic Resp Failure - (OSA-Obesity Hypoventilation with Acute Diastolic HF) - initially intubated & IV diuresis. BP meds held due to hypotension. Extubated 2 d ago & off IV lasix. 1 run of HD for Hyperkalemia  Principal Problem:   Acute respiratory failure with hypoxia Active Problems:   DM type 2, uncontrolled, with renal complications   Morbid obesity   Chronic atrial fibrillation   OSA (obstructive sleep apnea)   Anticoagulated on Coumadin   Hyperkalemia   AKI (acute kidney injury)   Shock circulatory   Acute exacerbation of CHF (congestive heart failure)   Acute on chronic respiratory failure with hypoxemia   Acute renal failure syndrome   pauses   Hypomagnesemia  Acute hypoxic respiratory failure: Extubated 3 days ago.   Acute Diastolic HF (HFPF - EF ~71%):  Net fluids: +0.2L/-9.7L.  Back on home Lasix 54m daily.  Continue to monitor I/O  Back on BB - ACE-I not restarted (partiall related to AKI & HyperK) - would hold off on restarting until K & Cr are stable @ baseline  We discussed daily weight monitoring and low sodium diet.   Acute  on chronic renal failure: One dialysis session for hyperkalemia. Creat improved further today.   Atrial fib: Maintaining Afib with controlled rate for the most part.  Some bradycardia.  Longest interval 2.5sec.  CCB cut back to 60 TID today.  Continue to monitor.  Restarted warfarin.INR subtherapeutic today. Pharmacy dosing.  On Beta blocker.      Hypotension: Improved. Gradually titrate back on HF meds.    LOS: 4 days    Clytee Heinrich PA-C 05/20/2014 10:17 AM

## 2014-05-20 NOTE — Progress Notes (Signed)
Physical Therapy Treatment Patient Details Name: William Flores MRN: [id number] DOB: 25-Feb-1950 Today's Date: 05/20/2014    History of Present Illness Patient is a 64 y/o male admitted with acute hypoxic respiratory failure.  PMH positive for A-fib, dCHF, OSA and CRF.    PT Comments    Pt did well today.  Walked further and abel to get his O2 sats up.  Pt working on sit to stands.  Talked about HEP once home.  Pt would like rollator for home.  Will benefit from  [facility] services.    Follow Up Recommendations  Home health PT;Supervision - Intermittent     Equipment Recommendations   (Pt would like a rollator so he would have a seat and be abel to get into community more easily - wth seat and way to hold his O2)    Recommendations for Other Services       Precautions / Restrictions Precautions Precautions: Fall Precaution Comments: watch O2 sats Restrictions Weight Bearing Restrictions: No    Mobility  Bed Mobility                  Transfers Overall transfer level: Needs assistance Equipment used: Rolling walker (2 wheeled) Transfers: Sit to/from Stand Sit to Stand: Supervision Stand pivot transfers: Supervision       General transfer comment: pt needed cues for hand placement.  Pt did sit to stands x 3 reps for strengthening.  pt relies on UEs - good control when sitting  Ambulation/Gait Ambulation/Gait assistance: Min guard Ambulation Distance (Feet):  (90 feet x 2) Assistive device:  (4 wheeled rollator) Gait Pattern/deviations: Step-through pattern;Decreased stride length     General Gait Details: Pt abel to maintain his O2 sats.  Pt educated on safety wtih sitting on rollator to rest - using brakes etc safely   Stairs            Wheelchair Mobility    Modified Rankin (Stroke Patients Only)       Balance                                    Cognition Arousal/Alertness: Awake/alert Behavior During Therapy: WFL for tasks  assessed/performed Overall Cognitive Status: Within Functional Limits for tasks assessed                      Exercises Other Exercises Other Exercises: we talked about going forward with HEP - short frequent walks, how to monitor his vitals, safety with breathing strategies, etc.    General Comments General comments (skin integrity, edema, etc.): Pt had BM sitting on BSC. he cleaned himself with no problems.      Pertinent Vitals/Pain Pain Assessment: No/denies pain    Home Living                      Prior Function            PT Goals (current goals can now be found in the care plan section) Progress towards PT goals: Progressing toward goals    Frequency  Min 3X/week    PT Plan Current plan remains appropriate    Co-evaluation             End of Session Equipment Utilized During Treatment: Gait belt;Oxygen Activity Tolerance: Patient limited by fatigue Patient left: in chair;with call bell/phone within reach     Time: 1305-1340 PT  Time Calculation (min) (ACUTE ONLY): 35 min  Charges:  $Gait Training: 8-22 mins $Therapeutic Exercise: 8-22 mins                    G Codes:      Loyal Buba 05/20/2014, 2:39 PM  Rande Lawman, PT

## 2014-05-20 NOTE — Progress Notes (Signed)
Occupational Therapy Treatment Patient Details Name: William Flores MRN: [id number] DOB: Aug 12, 1950 Today's Date: 05/20/2014    History of present illness Patient is a 64 y/o male admitted with acute hypoxic respiratory failure.  PMH positive for A-fib, dCHF, OSA and CRF.   OT comments  Pt. Progressing with acute OT goals.  Introduced Radiation protection practitioner for use during session.  Pt. Did well and states he really wants one for home secondary to current walker is a standard walker with no wheels and he also wants a way to carry items during ambulation.  Still requiring cues for o2 tubing management during ambulation and functional tasks.    Follow Up Recommendations  Home health OT    Equipment Recommendations  Other (comment) (rollator)    Recommendations for Other Services      Precautions / Restrictions Precautions Precautions: Fall Precaution Comments: watch O2 sats       Mobility Bed Mobility Overal bed mobility: Modified Independent Bed Mobility: Rolling;Sidelying to Sit;Supine to Sit Rolling: Modified independent (Device/Increase time) Sidelying to sit: Modified independent (Device/Increase time) Supine to sit: Modified independent (Device/Increase time)     General bed mobility comments: use of bedrail  Transfers Overall transfer level: Needs assistance Equipment used:  (rollator) Transfers: Sit to/from [facility] Sit to Stand: Supervision Stand pivot transfers: Supervision       General transfer comment: assistance with o2 tubing management and rollator management    Balance                                   ADL Overall ADL's : Needs assistance/impaired     Grooming: Wash/dry hands;Standing;Supervision/safety                   Toilet Transfer: Supervision/safety;Ambulation;BSC (used rollator during session today)   Toileting- Water quality scientist and Hygiene: Supervision/safety;Set up;Sitting/lateral lean        Functional mobility during ADLs: Supervision/safety (rollator) General ADL Comments: progressing well, cues for o2 tubing management during amb. and functional transfers      Vision                     Perception     Praxis      Cognition   Behavior During Therapy: [facility] for tasks assessed/performed Overall Cognitive Status: Within Functional Limits for tasks assessed                       Extremity/Trunk Assessment               Exercises     Shoulder Instructions       General Comments      Pertinent Vitals/ Pain       Pain Assessment: No/denies pain  Home Living                                          Prior Functioning/Environment              Frequency Min 2X/week     Progress Toward Goals  OT Goals(current goals can now be found in the care plan section)  Progress towards OT goals: Progressing toward goals     Plan Discharge plan remains appropriate    Co-evaluation  End of Session Equipment Utilized During Treatment: Oxygen;Other (comment) (rollator)   Activity Tolerance Patient tolerated treatment well   Patient Left in chair;with call bell/phone within reach   Nurse Communication          Time: 5521-7471 OT Time Calculation (min): 34 min  Charges: OT General Charges $OT Visit: 1 Procedure OT Treatments $Self Care/Home Management : 23-37 mins  Janice Coffin 05/20/2014, 7:47 AM

## 2014-05-20 NOTE — Progress Notes (Signed)
ANTICOAGULATION CONSULT NOTE - Follow Up Consult  Pharmacy Consult for Warfarin Indication: atrial fibrillation  Allergies  Allergen Reactions  . Codeine     Headache     Patient Measurements: Height: 5' 7"  (170.2 cm) Weight: 261 lb 12.8 oz (118.752 kg) IBW/kg (Calculated) : 66.1  Vital Signs: Temp: 98.5 F (36.9 C) (05/17 0535) Temp Source: Oral (05/17 0535) BP: 133/70 mmHg (05/17 0535) Pulse Rate: 71 (05/17 0535)  Labs:  Recent Labs  05/18/14 0646 05/19/14 0525 05/20/14 0442  HGB  --   --  9.5*  HCT  --   --  33.8*  PLT  --   --  136*  LABPROT 26.1* 25.0* 21.7*  INR 2.37* 2.24* 1.88*  CREATININE 1.49* 1.31* 1.17    Estimated Creatinine Clearance: 80.7 mL/min (by C-G formula based on Cr of 1.17).   Medical History: Past Medical History  Diagnosis Date  . Diabetes   . Colon cancer   . Hypertension   . Sleep apnea   . Chronic atrial fibrillation   . Hyperlipidemia   . Chronic diastolic heart failure     HFPF - EF ~65-70% (OFTEN EXACERBATED BY AFIB)  . Colon polyps 09/21/2012    Tubular adenoma  . Obesity hypoventilation syndrome     Medications:  Prescriptions prior to admission  Medication Sig Dispense Refill Last Dose  . allopurinol (ZYLOPRIM) 100 MG tablet Take 100 mg by mouth daily.  3 05/16/2014 at Unknown time  . azelastine (OPTIVAR) 0.05 % ophthalmic solution Place 2 drops into both eyes 2 (two) times daily. (Patient taking differently: Place 2 drops into both eyes daily as needed. ) 6 mL 12 Past Week at Unknown time  . benzonatate (TESSALON) 100 MG capsule Take 1 capsule (100 mg total) by mouth 2 (two) times daily as needed for cough. 20 capsule 0 Past Week at Unknown time  . diltiazem (CARDIZEM CD) 360 MG 24 hr capsule Take 360 mg by mouth daily.  5 05/16/2014 at Unknown time  . ferrous sulfate 325 (65 FE) MG tablet Take 1 tablet (325 mg total) by mouth 2 (two) times daily with a meal.  3 05/16/2014 at Unknown time  . furosemide (LASIX) 40 MG  tablet Take 40 mg by mouth daily.  3 05/16/2014 at Unknown time  . insulin aspart (NOVOLOG) 100 UNIT/ML injection Inject 0-9 Units into the skin 3 (three) times daily with meals. Sliding scale CBG 70 - 120: 0 units CBG 121 - 150: 1 unit,  CBG 151 - 200: 2 units,  CBG 201 - 250: 3 units,  CBG 251 - 300: 5 units,  CBG 301 - 350: 7 units,  CBG 351 - 400: 9 units   CBG > 400: 9 units and notify your MD 10 mL 11 05/16/2014 at Unknown time  . KLOR-CON M20 20 MEQ tablet Take 10 mEq by mouth daily.  11 05/16/2014 at Unknown time  . lisinopril (PRINIVIL,ZESTRIL) 5 MG tablet Take 5 mg by mouth daily.  11 05/16/2014 at Unknown time  . metolazone (ZAROXOLYN) 2.5 MG tablet Take 2.5 mg by mouth 2 (two) times daily.   2 05/16/2014 at Unknown time  . metoprolol tartrate (LOPRESSOR) 25 MG tablet Take 1 tablet (25 mg total) by mouth every 8 (eight) hours. (Patient taking differently: Take 25 mg by mouth 2 (two) times daily. )   05/16/2014 at 0830  . omeprazole (PRILOSEC) 20 MG capsule TAKE 1 CAPSULE (20 MG TOTAL) BY MOUTH DAILY. 30 capsule 4 05/16/2014  at Unknown time  . polyvinyl alcohol (LIQUIFILM TEARS) 1.4 % ophthalmic solution Place 1 drop into both eyes as needed for dry eyes. 15 mL 0 Past Month at Unknown time  . warfarin (COUMADIN) 10 MG tablet Take 1 tablet (10 mg total) by mouth daily at 6 PM. 30 tablet 0 05/17/2014 at Unknown time  . atorvastatin (LIPITOR) 20 MG tablet Take 1 tablet (20 mg total) by mouth daily at 6 PM. 30 tablet 0 05/15/2014  . benzonatate (TESSALON) 100 MG capsule TAKE 1 CAPSULE (100 MG TOTAL) BY MOUTH 2 (TWO) TIMES DAILY. (Patient not taking: Reported on 05/17/2014) 20 capsule 0 Not Taking at Unknown time  . diltiazem (CARDIZEM CD) 300 MG 24 hr capsule Take 1 capsule (300 mg total) by mouth daily. (Patient not taking: Reported on 05/17/2014)   Not Taking at Unknown time  . glucose blood test strip Use to check BG daily 100 each 2 unknown  . ONETOUCH DELICA LANCETS 37C MISC 1 each by Does not apply  route daily. Use one lancet to check BG daily 100 each 2 unknown    Assessment: [name] who presented with SOB, hypotension and bradycardia and intubated in ED. Patient has a history of Atrial fibrillation and was supposed to be on Coumadin prior to admission. His INR on admission was sub-therapeutic at 1.74 likely due to non-compliance. Pharmacy consulted to resume his Coumadin therapy.   His INR today is 1.88  Home Coumadin dose (per clinic notes): 7.5 mg daily except 10 mg on Mon/Fri.   Goal of Therapy:  INR 2-3 Monitor platelets by anticoagulation protocol: Yes   Plan:  -Give Coumadin 10 mg today -Monitor daily PT/INR   Thanks for allowing pharmacy to be a part of this patient's care.  Excell Seltzer, PharmD Clinical Pharmacist, [phone]

## 2014-05-20 NOTE — Progress Notes (Signed)
Pt had 2.57 second pause, MD made aware.

## 2014-05-20 NOTE — Progress Notes (Signed)
Pt stated he did not want to wear cpap for the evening, RT informed pt to call for RT if he changes his mind

## 2014-05-20 NOTE — Progress Notes (Signed)
[facility] PROGRESS NOTE  EDAHI KROENING AST:[id number] DOB: Jul 09, 1950 DOA: 05/16/2014 PCP: Redge Gainer, MD  Summary 64 yo male initially admitted to critical care for respiratory failure. Failed BiPAP and required intubation in the emergency room. Chest x-ray showed pulmonary edema and right pleural effusion. He was also bradycardic, hypotensive. He required vasopressors. Has history of obstructive sleep apnea, noncompliant with CPAP. Was also hyperkalemic, refractory to medical therapy so he required hemodialysis once. Transferred to hospitalists on 5/16. Nephrology has signed off. Cardiology following.  Assessment/Plan:  Principal Problem:   Acute respiratory failure with hypoxia secondary to CHF, OSA improved. Lasix per cardiology. They recommend sliding scale lasix at d/c (see note) Active Problems:   DM type 2, uncontrolled, with renal complications: improved   Morbid obesity   Chronic atrial fibrillation: Overnight, has had pauses above 2 seconds as well as periods of bradycardia mainly when asleep. Likely related to sleep apnea, but Cardiology has decreased short acting diltiazem.  Would likely change home dose to 240 mg XL. Coumadin per pharmacy   OSA (obstructive sleep apnea): Has not worn his C Pap machine at home for several years. Reports it is broken. Has tolerated BiPAP nightly here. Will ask care manager to assist with arranging new CPAP machine   Anticoagulated on Coumadin   Hyperkalemia s/p HD   AKI (acute kidney injury) much improved   Shock circulatory, resolved. Cardiac meds being resumed carefully. Discontinue central line   Acute on chronic Diastolic CHF (congestive heart failure) Anemia: Monitor hemoglobin  Code Status:  full Family Communication:  Friend at bedsiede 5/16 Disposition Plan:  Home with home health 1-2 days if stable  Procedures: right ij CVL Left HD cath ETT  HPI/Subjective: Feels much better. Does feel dyspneic and weak on exertion  with orthostasis. Was able to walk with physical therapy. Lives alone with family and friends checking on him periodically.  Objective: Filed Vitals:   05/20/14 1415  BP: 130/72  Pulse: 69  Temp: 98.7 F (37.1 C)  Resp: 18    Intake/Output Summary (Last 24 hours) at 05/20/14 1715 Last data filed at 05/20/14 1310  Gross per 24 hour  Intake    900 ml  Output   1125 ml  Net   -225 ml   Filed Weights   05/18/14 0500 05/19/14 0415 05/20/14 0346  Weight: 119.1 kg (262 lb 9.1 oz) 120.339 kg (265 lb 4.8 oz) 118.752 kg (261 lb 12.8 oz)    Exam:   General:  In recliner. Breathing nonlabored. Alert and oriented.   Neck: right IJ out  Cardiovascular: Irregularly irregular without murmurs gallops rubs  Respiratory: Clear to auscultation bilaterally without wheezes rhonchi or rales  Abdomen: Soft nontender nondistended  Ext: Trace edema  Basic Metabolic Panel:  Recent Labs Lab 05/16/14 1550  05/17/14 0245  05/17/14 2301 05/18/14 0455 05/18/14 0646 05/19/14 0525 05/20/14 0442  NA 141  < > 141  < > 142 144 145 144 142  K 6.8*  < > 4.9  < > 4.7 5.0 4.1 3.9 4.0  CL 103  < > 96*  < > 94* 98* 96* 98* 98*  CO2 29  < > 33*  < > 38* 39* 40* 39* 36*  GLUCOSE 242*  < > 226*  < > 117* 95 95 91 93  BUN 40*  < > 38*  < > 42* 39* 36* 32* 26*  CREATININE 2.14*  < > 1.88*  < > 1.59* 1.48* 1.49* 1.31* 1.17  CALCIUM 9.8  < > 9.3  < > 8.8* 8.8* 8.9 8.9 8.8*  MG  --   --  1.5*  --   --   --   --   --  1.7  PHOS 2.8  --  3.1  --   --  5.2*  --   --   --   < > = values in this interval not displayed. Liver Function Tests:  Recent Labs Lab 05/16/14 1550 05/18/14 0455  ALBUMIN 3.3* 3.0*   No results for input(s): LIPASE, AMYLASE in the last 168 hours. No results for input(s): AMMONIA in the last 168 hours. CBC:  Recent Labs Lab 05/16/14 0618 05/16/14 0640 05/17/14 0500 05/20/14 0442  WBC 8.3  --  6.2 5.2  NEUTROABS 5.6  --   --   --   HGB 9.6* 12.2* 9.7* 9.5*  HCT 35.0*  36.0* 32.4* 33.8*  MCV 93.3  --  86.4 91.8  PLT 178  --  174 136*   Cardiac Enzymes: No results for input(s): CKTOTAL, CKMB, CKMBINDEX, TROPONINI in the last 168 hours. BNP (last 3 results)  Recent Labs  04/14/14 0222 05/16/14 0618 05/17/14 0457  BNP 333.2* 147.4* 320.6*    ProBNP (last 3 results)  Recent Labs  08/05/13 1403 10/28/13 1746  PROBNP 376.6* 325.7*    CBG:  Recent Labs Lab 05/19/14 1640 05/19/14 2119 05/20/14 0756 05/20/14 1150 05/20/14 1645  GLUCAP 112* 151* 99 138* 106*    Recent Results (from the past 240 hour(s))  MRSA PCR Screening     Status: None   Collection Time: 05/16/14 10:30 AM  Result Value Ref Range Status   MRSA by PCR NEGATIVE NEGATIVE Final    Comment:        The GeneXpert MRSA Assay (FDA approved for NASAL specimens only), is one component of a comprehensive MRSA colonization surveillance program. It is not intended to diagnose MRSA infection nor to guide or monitor treatment for MRSA infections.      Studies: No results found.  Scheduled Meds: . allopurinol  100 mg Oral Daily  . antiseptic oral rinse  7 mL Mouth Rinse q12n4p  . atorvastatin  20 mg Oral q1800  . diltiazem  60 mg Oral 3 times per day  . feeding supplement (ENSURE ENLIVE)  237 mL Oral Q24H  . ferrous sulfate  325 mg Oral Q breakfast  . furosemide  40 mg Oral Daily  . insulin aspart  2-6 Units Subcutaneous TID AC & HS  . metoprolol tartrate  25 mg Oral BID  . Warfarin - Pharmacist Dosing Inpatient   Does not apply q1800   Continuous Infusions:   Time spent: 25 minutes  [place] [facility] Pager [phone], password [facility] 05/20/2014, 5:15 PM  LOS: 4 days

## 2014-05-21 ENCOUNTER — Telehealth: Payer: Self-pay | Admitting: Cardiovascular Disease

## 2014-05-21 DIAGNOSIS — R609 Edema, unspecified: Secondary | ICD-10-CM | POA: Insufficient documentation

## 2014-05-21 DIAGNOSIS — I10 Essential (primary) hypertension: Secondary | ICD-10-CM | POA: Insufficient documentation

## 2014-05-21 LAB — BASIC METABOLIC PANEL
ANION GAP: 7 (ref 5–15)
BUN: 23 mg/dL — ABNORMAL HIGH (ref 6–20)
CALCIUM: 8.8 mg/dL — AB (ref 8.9–10.3)
CHLORIDE: 99 mmol/L — AB (ref 101–111)
CO2: 34 mmol/L — ABNORMAL HIGH (ref 22–32)
Creatinine, Ser: 1.18 mg/dL (ref 0.61–1.24)
GFR calc non Af Amer: >60 mL/min (ref >60)
Glucose, Bld: 158 mg/dL — ABNORMAL HIGH (ref 65–99)
Potassium: 3.9 mmol/L (ref 3.5–5.1)
SODIUM: 140 mmol/L (ref 135–145)

## 2014-05-21 LAB — MAGNESIUM: MAGNESIUM: 1.8 mg/dL (ref 1.7–2.4)

## 2014-05-21 LAB — GLUCOSE, CAPILLARY
GLUCOSE-CAPILLARY: 93 mg/dL (ref 65–99)
Glucose-Capillary: 137 mg/dL — ABNORMAL HIGH (ref 65–99)
Glucose-Capillary: 134 mg/dL — ABNORMAL HIGH (ref 65–99)
Glucose-Capillary: 181 mg/dL — ABNORMAL HIGH (ref 65–99)

## 2014-05-21 LAB — PROTIME-INR
INR: 1.61 — AB (ref 0.00–1.49)
PROTHROMBIN TIME: 19.3 s — AB (ref 11.6–15.2)

## 2014-05-21 MED ORDER — POTASSIUM CHLORIDE CRYS ER 20 MEQ PO TBCR
20.0000 meq | EXTENDED_RELEASE_TABLET | Freq: Once | ORAL | Status: AC
  Administered 2014-05-21: 20 meq via ORAL
  Filled 2014-05-21: qty 1

## 2014-05-21 MED ORDER — WARFARIN SODIUM 2.5 MG PO TABS
12.5000 mg | ORAL_TABLET | Freq: Once | ORAL | Status: AC
  Administered 2014-05-21: 12.5 mg via ORAL
  Filled 2014-05-21: qty 1

## 2014-05-21 MED ORDER — MAGNESIUM SULFATE 50 % IJ SOLN
3.0000 g | Freq: Once | INTRAVENOUS | Status: AC
  Administered 2014-05-21: 3 g via INTRAVENOUS
  Filled 2014-05-21: qty 6

## 2014-05-21 NOTE — Progress Notes (Signed)
Pt with 7 beat run vtach no s/s co chest pain or sob. MD notified.

## 2014-05-21 NOTE — Progress Notes (Signed)
[facility]                                                                              Patient Demographics  William Flores, is a 64 y.o. male, DOB - [birth year], VXY:[id number]  Admit date - 05/16/2014   Admitting Physician [name], MD  Outpatient Primary MD for the patient is Redge Gainer, MD  LOS - 5   Chief Complaint  Patient presents with  . Respiratory Distress       Brief HPI   Patient is a 64 year old male with diabetes, hypertension, hyperlipidemia, chronic diastolic CHF, atrial fibrillation on Coumadin, colon cancer status post resection, obstructive sleep apnea, chronic respiratory failure with 2 L O2 via nasal cannula, renal failure who presented to ED on 5/13 with shortness of breath bradycardia and hypotension. Chest x-ray showed pulmonary edema with right pleural effusion. He has history of obstructive sleep apnea, noncompliant with CPAP. Patient was placed on BiPAP in ED and subsequently required intubation. Patient was admitted to ICU by the critical care service. He required vasopressors. He was also found to be hyperkalemic refractory to medical treatment and required hemodialysis months. Patient was transferred to the hospitalist service on 5/16.    Assessment & Plan    Principal Problem:   Acute respiratory failure with hypoxia: Multifactorial likely due to acute on chronic diastolic CHF exacerbation, hypoxia due to obstructive sleep apnea and noncompliance with CPAP - Patient has been extubated and O2 sats at 97% on 2 L - Cardiology following, recommended Lasix sliding scale, monitor I's and O's, -8.9 L, weight 290->263 lbs   Active Problems: Acute diastolic CHF exacerbation - Cardiology following, recommended Lasix sliding scale, monitor I's and O's, -8.9 L, weight 290->263 lbs  - 2-D echo showed EF of 65-70%, no regional wall motion abnormalities    DM type 2, uncontrolled, with renal complications: overall uncontrolled  -  Blood sugars controlled currentlyInpatient, hemoglobin A1c 8.1 in 2/16    Morbid obesity - Patient counseled on diet and weight control    Chronic atrial fibrillation - Currently rate controlled, some bradycardia during this hospitalization with positive, longest 2.5 seconds. Calcium channel blocker was decreased by cardiology, placed on warfarin. Continue beta blocker - INR still 1.6, continue Coumadin per pharmacy  Hypotension - Improved, continue metoprolol, Lasix, Cardizem -Patient complained of dizziness today, orthostatics negative     OSA (obstructive sleep apnea) - Patient reports that CPAP machine at home is broken, discussed with case manager   Acute on chronic renal failure  -Creatinine currently stable, improving . Creatinine was 2.5 at the time of admission , improved to 1.17  Code Status:  Full code  Family Communication: Discussed in detail with the patient, all imaging results, lab results explained to the patient    Disposition Plan:Hopefully DC home in a.m. Will need home health PT, OT, RN, home health aide, as patient states lives alone  Time Spent in minutes   25 minutes  Procedures Mechanical intubation  Consults  Cardiology,  pulmonology   DVT Prophylaxis  Warfarin  Medications  Scheduled Meds: . allopurinol  100 mg Oral Daily  . antiseptic  oral rinse  7 mL Mouth Rinse q12n4p  . atorvastatin  20 mg Oral q1800  . diltiazem  60 mg Oral 3 times per day  . feeding supplement (ENSURE ENLIVE)  237 mL Oral Q24H  . ferrous sulfate  325 mg Oral Q breakfast  . furosemide  40 mg Oral Daily  . insulin aspart  2-6 Units Subcutaneous TID AC & HS  . metoprolol tartrate  25 mg Oral BID  . warfarin  12.5 mg Oral ONCE-1800  . Warfarin - Pharmacist Dosing Inpatient   Does not apply q1800   Continuous Infusions:  PRN Meds:.acetaminophen, docusate, ondansetron (ZOFRAN) IV, sodium chloride   Antibiotics   Anti-infectives    None        Subjective:    Jyair Kiraly was seen and examined today.  Patient reporting dizziness today, does not feel that he'll be able to manage at home by himself today.  Patient denies chest pain, shortness of breath, abdominal pain, N/V/D/C, new weakness, numbess, tingling. No acute events overnight.    Objective:   Blood pressure 150/75, pulse 85, temperature 98 F (36.7 C), temperature source Oral, resp. rate 18, height 5' 7"  (1.702 m), weight 119.432 kg (263 lb 4.8 oz), SpO2 97 %.  Wt Readings from Last 3 Encounters:  05/21/14 119.432 kg (263 lb 4.8 oz)  04/23/14 128.3 kg (282 lb 13.6 oz)  03/20/14 121.564 kg (268 lb)     Intake/Output Summary (Last 24 hours) at 05/21/14 1153 Last data filed at 05/21/14 1114  Gross per 24 hour  Intake   1503 ml  Output    600 ml  Net    903 ml    Exam  General: Alert and oriented x 3, NAD  HEENT:  PERRLA, EOMI, Anicteic Sclera, mucous membranes moist.   Neck: Supple, no JVD, no masses  CVS:  irregularly regular, no MRG   Respiratory: Clear to auscultation bilaterally, no wheezing, rales or rhonchi  Abdomen: Soft, nontender, nondistended, + bowel sounds  Ext: no cyanosis clubbing, trace LE edema  Neuro: AAOx3, Cr N's II- XII. Strength 5/5 upper and lower extremities bilaterally  Skin: No rashes  Psych: Normal affect and demeanor, alert and oriented x3    Data Review   Micro Results Recent Results (from the past 240 hour(s))  MRSA PCR Screening     Status: None   Collection Time: 05/16/14 10:30 AM  Result Value Ref Range Status   MRSA by PCR NEGATIVE NEGATIVE Final    Comment:        The GeneXpert MRSA Assay (FDA approved for NASAL specimens only), is one component of a comprehensive MRSA colonization surveillance program. It is not intended to diagnose MRSA infection nor to guide or monitor treatment for MRSA infections.     Radiology Reports Dg Chest Port 1 View  05/17/2014   CLINICAL DATA:  CHF  EXAM: PORTABLE CHEST - 1 VIEW   COMPARISON:  Yesterday  FINDINGS: Tubular device is stable. Mild cardiomegaly. Small right pleural effusion stable. Vascular congestion and edema have slightly improved. No pneumothorax.  IMPRESSION: Slightly improved edema. Otherwise stable including a small right pleural effusion.   Electronically Signed   By: Marybelle Killings M.D.   On: 05/17/2014 08:20   Dg Chest Port 1 View  05/16/2014   CLINICAL DATA: Left IJ dialysis catheter placement.  EXAM: PORTABLE CHEST - 1 VIEW  COMPARISON:  One-view chest x-ray from the same day.  FINDINGS: The endotracheal tube terminates 7 point 4  cm above the carina. A right IJ line is stable. The NG tube courses off the inferior border the film.  A new left IJ dialysis catheter terminates in the midline, presumably within the innominate vein. Heart is enlarged. Mild generalized edema is stable. Bilateral effusions are again noted, right greater than left.  IMPRESSION: 1. Interval placement of left IJ dialysis catheter. The tip terminates at the midline, presumably within the innominate vein. 2. Support apparatus is otherwise stable. 3. Stable cardiomegaly. 4. Mild to moderate generalized edema. 5. Right greater than left pleural effusions are stable.   Electronically Signed   By: San Morelle M.D.   On: 05/16/2014 15:09   Dg Chest Portable 1 View  05/16/2014   CLINICAL DATA:  Hypoxia  EXAM: PORTABLE CHEST - 1 VIEW  COMPARISON:  May 16, 2014  FINDINGS: Endotracheal tube tip is 4.6 cm above the carina. Central catheter tip is in the superior vena cava. Nasogastric tube tip and side port are below the diaphragm. No pneumothorax. There are bilateral pleural effusions with cardiomegaly and pulmonary venous hypertension. There is left lower lobe consolidation.  IMPRESSION: Tube and catheter positions as described without pneumothorax. Evidence of congestive heart failure. Opacity in the left base with consolidation may represent alveolar edema. Superimposed pneumonia is  concerning in the left lower lobe.   Electronically Signed   By: Lowella Grip III M.D.   On: 05/16/2014 08:42   Dg Chest Port 1 View  05/16/2014   CLINICAL DATA:  Respiratory distress.  CHF.  EXAM: PORTABLE CHEST - 1 VIEW  COMPARISON:  04/17/2014  FINDINGS: Shallow inspiration. Cardiac enlargement with pulmonary vascular congestion. Bilateral pleural effusions with atelectasis in the lung bases. Perihilar airspace disease suggest edema. No pneumothorax. Calcification of the aorta.  IMPRESSION: Cardiac enlargement with pulmonary vascular congestion, bilateral pleural effusions, and perihilar edema.   Electronically Signed   By: Lucienne Capers M.D.   On: 05/16/2014 06:31    CBC  Recent Labs Lab 05/16/14 0618 05/16/14 0640 05/17/14 0500 05/20/14 0442  WBC 8.3  --  6.2 5.2  HGB 9.6* 12.2* 9.7* 9.5*  HCT 35.0* 36.0* 32.4* 33.8*  PLT 178  --  174 136*  MCV 93.3  --  86.4 91.8  MCH 25.6*  --  25.9* 25.8*  MCHC 27.4*  --  29.9* 28.1*  RDW 20.4*  --  20.7* 19.8*  LYMPHSABS 1.4  --   --   --   MONOABS 1.1*  --   --   --   EOSABS 0.1  --   --   --   BASOSABS 0.1  --   --   --     Chemistries   Recent Labs Lab 05/17/14 0245  05/17/14 2301 05/18/14 0455 05/18/14 0646 05/19/14 0525 05/20/14 0442  NA 141  < > 142 144 145 144 142  K 4.9  < > 4.7 5.0 4.1 3.9 4.0  CL 96*  < > 94* 98* 96* 98* 98*  CO2 33*  < > 38* 39* 40* 39* 36*  GLUCOSE 226*  < > 117* 95 95 91 93  BUN 38*  < > 42* 39* 36* 32* 26*  CREATININE 1.88*  < > 1.59* 1.48* 1.49* 1.31* 1.17  CALCIUM 9.3  < > 8.8* 8.8* 8.9 8.9 8.8*  MG 1.5*  --   --   --   --   --  1.7  < > = values in this interval not displayed. ------------------------------------------------------------------------------------------------------------------ estimated creatinine clearance  is 80.9 mL/min (by C-G formula based on Cr of  1.17). ------------------------------------------------------------------------------------------------------------------ No results for input(s): HGBA1C in the last 72 hours. ------------------------------------------------------------------------------------------------------------------ No results for input(s): CHOL, HDL, LDLCALC, TRIG, CHOLHDL, LDLDIRECT in the last 72 hours. ------------------------------------------------------------------------------------------------------------------ No results for input(s): TSH, T4TOTAL, T3FREE, THYROIDAB in the last 72 hours.  Invalid input(s): FREET3 ------------------------------------------------------------------------------------------------------------------ No results for input(s): VITAMINB12, FOLATE, FERRITIN, TIBC, IRON, RETICCTPCT in the last 72 hours.  Coagulation profile  Recent Labs Lab 05/16/14 0618 05/18/14 0646 05/19/14 0525 05/20/14 0442 05/21/14 0541  INR 1.74* 2.37* 2.24* 1.88* 1.61*    No results for input(s): DDIMER in the last 72 hours.  Cardiac Enzymes No results for input(s): CKMB, TROPONINI, MYOGLOBIN in the last 168 hours.  Invalid input(s): CK ------------------------------------------------------------------------------------------------------------------ Invalid input(s): [place]  05/19/14 2119 05/20/14 0756 05/20/14 1150 05/20/14 1645 05/20/14 2152 05/21/14 0804  GLUCAP 151* 99 138* 106* 168* 85     RAI,RIPUDEEP M.D. [facility] 05/21/2014, 11:53 AM  Pager: [phone]   Between 7am to 7pm - call Pager - [phone]  After 7pm go to [email or web address] - password [email or web address]  Call night coverage person covering after 7pm

## 2014-05-21 NOTE — Progress Notes (Signed)
ANTICOAGULATION CONSULT NOTE - Follow Up Consult  Pharmacy Consult for Warfarin Indication: atrial fibrillation  Allergies  Allergen Reactions  . Codeine     Headache     Patient Measurements: Height: 5' 7"  (170.2 cm) Weight: 263 lb 4.8 oz (119.432 kg) IBW/kg (Calculated) : 66.1  Vital Signs: Temp: 98 F (36.7 C) (05/18 0511) Temp Source: Oral (05/18 0511) BP: 113/53 mmHg (05/18 0957) Pulse Rate: 65 (05/18 0957)  Labs:  Recent Labs  05/19/14 0525 05/20/14 0442 05/21/14 0541  HGB  --  9.5*  --   HCT  --  33.8*  --   PLT  --  136*  --   LABPROT 25.0* 21.7* 19.3*  INR 2.24* 1.88* 1.61*  CREATININE 1.31* 1.17  --     Estimated Creatinine Clearance: 80.9 mL/min (by C-G formula based on Cr of 1.17).   Medical History: Past Medical History  Diagnosis Date  . Diabetes   . Colon cancer   . Hypertension   . Sleep apnea   . Chronic atrial fibrillation   . Hyperlipidemia   . Chronic diastolic heart failure     HFPF - EF ~65-70% (OFTEN EXACERBATED BY AFIB)  . Colon polyps 09/21/2012    Tubular adenoma  . Obesity hypoventilation syndrome     Medications:  Prescriptions prior to admission  Medication Sig Dispense Refill Last Dose  . allopurinol (ZYLOPRIM) 100 MG tablet Take 100 mg by mouth daily.  3 05/16/2014 at Unknown time  . azelastine (OPTIVAR) 0.05 % ophthalmic solution Place 2 drops into both eyes 2 (two) times daily. (Patient taking differently: Place 2 drops into both eyes daily as needed. ) 6 mL 12 Past Week at Unknown time  . benzonatate (TESSALON) 100 MG capsule Take 1 capsule (100 mg total) by mouth 2 (two) times daily as needed for cough. 20 capsule 0 Past Week at Unknown time  . diltiazem (CARDIZEM CD) 360 MG 24 hr capsule Take 360 mg by mouth daily.  5 05/16/2014 at Unknown time  . ferrous sulfate 325 (65 FE) MG tablet Take 1 tablet (325 mg total) by mouth 2 (two) times daily with a meal.  3 05/16/2014 at Unknown time  . furosemide (LASIX) 40 MG tablet  Take 40 mg by mouth daily.  3 05/16/2014 at Unknown time  . insulin aspart (NOVOLOG) 100 UNIT/ML injection Inject 0-9 Units into the skin 3 (three) times daily with meals. Sliding scale CBG 70 - 120: 0 units CBG 121 - 150: 1 unit,  CBG 151 - 200: 2 units,  CBG 201 - 250: 3 units,  CBG 251 - 300: 5 units,  CBG 301 - 350: 7 units,  CBG 351 - 400: 9 units   CBG > 400: 9 units and notify your MD 10 mL 11 05/16/2014 at Unknown time  . KLOR-CON M20 20 MEQ tablet Take 10 mEq by mouth daily.  11 05/16/2014 at Unknown time  . lisinopril (PRINIVIL,ZESTRIL) 5 MG tablet Take 5 mg by mouth daily.  11 05/16/2014 at Unknown time  . metolazone (ZAROXOLYN) 2.5 MG tablet Take 2.5 mg by mouth 2 (two) times daily.   2 05/16/2014 at Unknown time  . metoprolol tartrate (LOPRESSOR) 25 MG tablet Take 1 tablet (25 mg total) by mouth every 8 (eight) hours. (Patient taking differently: Take 25 mg by mouth 2 (two) times daily. )   05/16/2014 at 0830  . omeprazole (PRILOSEC) 20 MG capsule TAKE 1 CAPSULE (20 MG TOTAL) BY MOUTH DAILY. 30 capsule  4 05/16/2014 at Unknown time  . polyvinyl alcohol (LIQUIFILM TEARS) 1.4 % ophthalmic solution Place 1 drop into both eyes as needed for dry eyes. 15 mL 0 Past Month at Unknown time  . warfarin (COUMADIN) 10 MG tablet Take 1 tablet (10 mg total) by mouth daily at 6 PM. 30 tablet 0 05/17/2014 at Unknown time  . atorvastatin (LIPITOR) 20 MG tablet Take 1 tablet (20 mg total) by mouth daily at 6 PM. 30 tablet 0 05/15/2014  . benzonatate (TESSALON) 100 MG capsule TAKE 1 CAPSULE (100 MG TOTAL) BY MOUTH 2 (TWO) TIMES DAILY. (Patient not taking: Reported on 05/17/2014) 20 capsule 0 Not Taking at Unknown time  . diltiazem (CARDIZEM CD) 300 MG 24 hr capsule Take 1 capsule (300 mg total) by mouth daily. (Patient not taking: Reported on 05/17/2014)   Not Taking at Unknown time  . glucose blood test strip Use to check BG daily 100 each 2 unknown  . ONETOUCH DELICA LANCETS 14E MISC 1 each by Does not apply route  daily. Use one lancet to check BG daily 100 each 2 unknown    Assessment: 37 YOM who presented with SOB, hypotension and bradycardia and intubated in ED. Patient has a history of Atrial fibrillation and was supposed to be on Coumadin prior to admission. His INR on admission was sub-therapeutic at 1.74 likely due to non-compliance. Pharmacy consulted to resume his Coumadin therapy.   His INR today is down to 1.61 despite home doses. Eating 75-100% of meals   Home Coumadin dose (per clinic notes): 7.5 mg daily except 10 mg on Mon/Fri.   Goal of Therapy:  INR 2-3 Monitor platelets by anticoagulation protocol: Yes   Plan:  -Give Coumadin boost of 12.5 mg today. Anticipate INR to start trending back up tomorrow -Monitor daily PT/INR   Albertina Parr, PharmD., BCPS Clinical Pharmacist Pager [phone]

## 2014-05-21 NOTE — Progress Notes (Signed)
Pt. Refuses BIPAP at this time. Pt. Is aware to call RT if he changes his mind & decides to wear BIPAP.

## 2014-05-21 NOTE — Telephone Encounter (Signed)
New message    Per Vin    TCM appt on  5/27 with Brynda Rim.

## 2014-05-21 NOTE — Progress Notes (Signed)
Occupational Therapy Treatment Patient Details Name: ZAYD BONET MRN: [id number] DOB: December 19, 1950 Today's Date: 05/21/2014    History of present illness Patient is a 64 y.o. male admitted with acute hypoxic respiratory failure.  PMH positive for A-fib, dCHF, OSA and CRF.   OT comments  Pt moving well. Education provided in session.  Follow Up Recommendations  Home health OT    Equipment Recommendations  Other (comment) (rollator)    Recommendations for Other Services      Precautions / Restrictions Precautions Precautions: Fall Precaution Comments: watch O2 sats Restrictions Weight Bearing Restrictions: No       Mobility Bed Mobility               General bed mobility comments: not assessed  Transfers Overall transfer level: Needs assistance   Transfers: Sit to/from Stand Sit to Stand: Supervision         General transfer comment: cue for hand placement/discussed locking breaks    Balance  No LOB in session. Balance not formally assessed.                                 ADL Overall ADL's : Needs assistance/impaired     Grooming: Set up;Supervision/safety;Standing;Oral care   Upper Body Bathing: Supervision/ safety;Standing (wet hair at sink)           Lower Body Dressing: Minimal assistance;Sitting/lateral leans (changed socks)   Toilet Transfer: Supervision/safety;Ambulation;RW (chair; also used rollator)           Functional mobility during ADLs: Supervision/safety;Rolling walker (also used rollator) General ADL Comments: Educated on safety such as safe footwear, sitting for LB bathing, and use of bag/basket on walker. Educated on energy conservation techniques and deep brathing technique. Discussed height that rollator should be for pt. Discussed AE. Pt reports he normally puts his legs up on bed to help reach LB.      Vision                     Perception     Praxis      Cognition  Awake/Alert Behavior  During Therapy: WFL for tasks assessed/performed Overall Cognitive Status: Within Functional Limits for tasks assessed                       Extremity/Trunk Assessment               Exercises     Shoulder Instructions       General Comments      Pertinent Vitals/ Pain       Pain Assessment: No/denies pain; pt on 2L of O2 in session and it dropped to 80s in session, but trended up.  Home Living                                          Prior Functioning/Environment              Frequency Min 2X/week     Progress Toward Goals  OT Goals(current goals can now be found in the care plan section)  Progress towards OT goals: Progressing toward goals  Acute Rehab OT Goals Patient Stated Goal: not stated OT Goal Formulation: With patient Time For Goal Achievement: 06/02/14 Potential to Achieve Goals: Good ADL Goals Pt Will Perform Lower Body  Bathing: sit to/from stand;with adaptive equipment;with supervision;with set-up Pt Will Perform Lower Body Dressing: with set-up;with supervision;with adaptive equipment;sit to/from stand Pt Will Transfer to Toilet: with modified independence;ambulating;bedside commode Pt Will Perform Toileting - Clothing Manipulation and hygiene: with modified independence;sit to/from stand;with adaptive equipment;sitting/lateral leans Additional ADL Goal #1: Pt will demonstrate 3 energy conservation techniques for ADL tasks  with min vc  Plan Discharge plan remains appropriate    Co-evaluation                 End of Session Equipment Utilized During Treatment: Oxygen;Gait belt;Rolling walker;Other (comment) (rollator)   Activity Tolerance Patient tolerated treatment well   Patient Left in chair;with call bell/phone within reach   Nurse Communication          Time: 7972-8206 OT Time Calculation (min): 18 min  Charges: OT General Charges $OT Visit: 1 Procedure OT Treatments $Self Care/Home Management  : 8-22 mins   Benito Mccreedy  OTR/L [phone]  05/21/2014, 11:55 AM

## 2014-05-21 NOTE — Progress Notes (Addendum)
[facility] chosen by patient for home health services, [place] and [place] notified for [facility] RN PT OT and rollator. Pt instructed to call number on CPAP machine (from home) to get any replacement parts or new machine.

## 2014-05-21 NOTE — Progress Notes (Signed)
Patient Name: William Flores Date of Encounter: 05/21/2014  Principal Problem:   Acute respiratory failure with hypoxia Active Problems:   DM type 2, uncontrolled, with renal complications   Morbid obesity   Chronic atrial fibrillation   OSA (obstructive sleep apnea)   Anticoagulated on Coumadin   Hyperkalemia   AKI (acute kidney injury)   Shock circulatory   Acute exacerbation of CHF (congestive heart failure)   Acute on chronic respiratory failure with hypoxemia   Acute renal failure syndrome   pauses   Hypomagnesemia  SUBJECTIVE  Feels week and tired. Denies chest pain, sob or palpitation.  Requested CPAP at home. Wants to stay additional night.   CURRENT MEDS . allopurinol  100 mg Oral Daily  . antiseptic oral rinse  7 mL Mouth Rinse q12n4p  . atorvastatin  20 mg Oral q1800  . diltiazem  60 mg Oral 3 times per day  . feeding supplement (ENSURE ENLIVE)  237 mL Oral Q24H  . ferrous sulfate  325 mg Oral Q breakfast  . furosemide  40 mg Oral Daily  . insulin aspart  2-6 Units Subcutaneous TID AC & HS  . metoprolol tartrate  25 mg Oral BID  . warfarin  12.5 mg Oral ONCE-1800  . Warfarin - Pharmacist Dosing Inpatient   Does not apply q1800    OBJECTIVE  Filed Vitals:   05/21/14 1045 05/21/14 1046 05/21/14 1047 05/21/14 1050  BP: 124/68 132/64 141/70 150/75  Pulse: 75 70 82 85  Temp:      TempSrc:      Resp:    18  Height:      Weight:      SpO2: 92% 96% 96% 97%    Intake/Output Summary (Last 24 hours) at 05/21/14 1129 Last data filed at 05/21/14 1114  Gross per 24 hour  Intake   1503 ml  Output    600 ml  Net    903 ml   Filed Weights   05/19/14 0415 05/20/14 0346 05/21/14 0514  Weight: 265 lb 4.8 oz (120.339 kg) 261 lb 12.8 oz (118.752 kg) 263 lb 4.8 oz (119.432 kg)    PHYSICAL EXAM  General: Pleasant, NAD. Neuro: Alert and oriented X 3. Moves all extremities spontaneously. Psych: Normal affect. HEENT:  Normal  Neck: Supple without bruits or  JVD. Lungs:  Resp regular and unlabored, CTA bilaterally without rales or wheezing.  Heart: irregularly irregular no s3, s4, or murmurs. Abdomen: Soft, non-tender, non-distended, BS + x 4.  Extremities: No clubbing, cyanosis. DP/PT/Radials 2+ and equal bilaterally. Trace to 1+ LE edema bilaterally.   Accessory Clinical Findings  CBC  Recent Labs  05/20/14 0442  WBC 5.2  HGB 9.5*  HCT 33.8*  MCV 91.8  PLT 035*   Basic Metabolic Panel  Recent Labs  05/19/14 0525 05/20/14 0442  NA 144 142  K 3.9 4.0  CL 98* 98*  CO2 39* 36*  GLUCOSE 91 93  BUN 32* 26*  CREATININE 1.31* 1.17  CALCIUM 8.9 8.8*  MG  --  1.7    TELE  Rate controlled afib with transient bradycardia at night with few pause above 2 sec.  Radiology/Studies Echo 05/16/2014 Study Conclusions  - Left ventricle: The cavity size was normal. There was mild concentric hypertrophy. Systolic function was vigorous. The estimated ejection fraction was in the range of 65% to 70%. Wall motion was normal; there were no regional wall motion abnormalities. The study is not technically sufficient to allow evaluation  of LV diastolic function. - Aortic valve: Trileaflet; mildly calcified leaflets. Transvalvular velocity was minimally increased. There was no regurgitation. Mean gradient (S): 9 mm Hg. Peak gradient (S): 19 mm Hg. - Mitral valve: Heavily calcified subvalvular apparatus. MAC. Mild regurgitation. - Left atrium: Moderately dilated at 47 ml/m2. - Right ventricle: The cavity size was mildly dilated. Mildly reduced systolic function. Lateral annulus peak S velocity: 11.1 cm/s. - Right atrium: Severely dilated at 31 cm2. - Inferior vena cava: The vessel was dilated. The respirophasic diameter changes were blunted (< 50%), consistent with elevated central venous pressure - or due to positive pressure ventilation.   ASSESSMENT AND PLAN   1. Acute hypoxic respiratory failure  -  Extubated 3 days ago.   2. Acute Diastolic HF (HFPF - EF ~16%): - Net fluids negative 8.7 L since admission, however positive 1L in last 24 hours. On exam trace to 1 + LE edema bilaterally. Continue home Lasix 47m daily. Continue to monitor I/O -Back on BB - ACE-I not restarted (partiall related to AKI & HyperK)  - K and creatinine back to normal. Magnesium is normal. Plan to discharge with sliding scale lasix education. BMET in 1 week.  - Will restart ACE depending on renal function. Likely need add on agent for better BP management if unable to start ACE.   3. Acute on chronic renal failure One dialysis session for hyperkalemia. Creatinin back to normal 1.17  4. Chronic Atrial fibrillation - Rate controlled afib with transient bradycardia. CCB cut back to 60 TID yesterday due to pause and transient bradycardia. Overnight, pt had a few episode of pauses above 2 seconds, longest was about 2.5sec. Tele also showed transient bradycardia at rates in 40s. Likley OSA/OHS related. Coumadin per pharmacy. Continue Beta blocker.Plan to discharge with cardizem CD 1880m  5. Hypotension Improved. Systolic BP in [id number] 6. obstructive sleep apnea -  Has not worn his C Pap machine at home for several years. Reports it is broken. Has tolerated BiPAP nightly here. Will need at home.  7. Anemia - Hgb 9.5. Continue to monitor.   Dispo: During last month hospitalization pt was advice to f/u with heart failure clinic, However he did not able to [place]. He missed yesterday's appointment with Dr. BeGwenlyn FoundWill schedule 1 week TOC appointment and then needs to be follow by heart failure clinic.   Sliding scale Lasix: Weigh yourself when you get home, then Daily in the Morning. Your "Dry Weight" is what you weigh upon arrival home.   If you gain more than 3 pounds from dry weight: Increase the Lasix dosing to 40 mg in the morning and 40 mg in the afternoon  until weight returns to baseline dry weight.  If weight gain is greater than 5 pounds in 2 days: Increased to Lasix 80 mg in the morning and 40 mg in the afternoon and contact the office for further assistance if weight does not go down the next day.  If the weight goes down more than 3 pounds from dry weight: Hold Lasix until it returns to baseline dry weight  Signed, Bhagat,Bhavinkumar PA-C Pager [phone]  I have seen and evaluated the patient along with Mr. BhCurly ShoresPA-C this afternoon on rounds. We discussed the patient and reviewed the available data and chart. I agree with his findings, examination, assessment and recommendations  The patient has maintained a more stable heart rate with reduced calcium channel blocker and  his blood pressure is starting Which means we may be ready to start considering restarting ARB/ACE inhibitor. Hemoglobin is stable. INR being adjusted by pharmacy. Needs home CPAP machine fixed, as I'm sure that OHS from OSA is playing a major role in his diastolic dysfunction. Will need evaluation to get a new machine upon discharge.  Divided his heart rate remains stable, I anticipate admitting him to be discharged tomorrow. Remains in chronic A. fib, will need close follow-up for INR check.   Leonie Man, M.D., M.S. Interventional Cardiologist   Pager # [phone]

## 2014-05-22 LAB — PROTIME-INR
INR: 1.54 — ABNORMAL HIGH (ref 0.00–1.49)
PROTHROMBIN TIME: 18.7 s — AB (ref 11.6–15.2)

## 2014-05-22 LAB — GLUCOSE, CAPILLARY: Glucose-Capillary: 105 mg/dL — ABNORMAL HIGH (ref 65–99)

## 2014-05-22 MED ORDER — METOPROLOL TARTRATE 25 MG PO TABS: 25.0000 mg | ORAL_TABLET | Freq: Two times a day (BID) | ORAL | Status: DC

## 2014-05-22 MED ORDER — DILTIAZEM HCL ER COATED BEADS 180 MG PO CP24: 180.0000 mg | ORAL_CAPSULE | Freq: Every day | ORAL | Status: DC

## 2014-05-22 MED ORDER — WARFARIN SODIUM 2.5 MG PO TABS
12.5000 mg | ORAL_TABLET | Freq: Once | ORAL | Status: DC
  Filled 2014-05-22: qty 1

## 2014-05-22 MED ORDER — FUROSEMIDE 40 MG PO TABS: 40.0000 mg | ORAL_TABLET | Freq: Every day | ORAL | Status: DC

## 2014-05-22 NOTE — Progress Notes (Signed)
Utilization Review completed. Bracha Frankowski RN BSN CM 

## 2014-05-22 NOTE — Progress Notes (Signed)
Pt to call PCP to obtain new CPAP machine after discharge. Per date of last Sleep Study, PCP can determine if new sleep study must be ordered or if patient can obtain new CPAP machine. Pt understands plan and agrees to call PCP after discharge.

## 2014-05-22 NOTE — Discharge Summary (Signed)
Physician Discharge Summary   Patient ID: William Flores MRN: [id number] DOB/AGE: 1950-01-23 64 y.o.  Admit date: 05/16/2014 Discharge date: 05/22/2014  Primary Care Physician:  Redge Gainer, MD  Discharge Diagnoses:    . Acute respiratory failure with hypoxia . Hyperkalemia . AKI (acute kidney injury) . Shock circulatory . OSA (obstructive sleep apnea) . Morbid obesity . DM type 2, uncontrolled, with renal complications . Acute exacerbation of CHF (congestive heart failure) . Acute renal failure syndrome . Chronic atrial fibrillation . Hypomagnesemia  Consults:  Cardiology, Dr Ellyn Hack   Recommendations for Outpatient Follow-up:  Patient was placed on Lasix sliding scale and explained to the patient  TESTS THAT NEED FOLLOW-UP CBC, BMET, PT/INR   DIET: Carb modified    Allergies:   Allergies  Allergen Reactions  . Codeine     Headache      Discharge Medications:   Medication List    STOP taking these medications        benzonatate 100 MG capsule  Commonly known as:  TESSALON     lisinopril 5 MG tablet  Commonly known as:  PRINIVIL,ZESTRIL     metolazone 2.5 MG tablet  Commonly known as:  ZAROXOLYN      TAKE these medications        allopurinol 100 MG tablet  Commonly known as:  ZYLOPRIM  Take 100 mg by mouth daily.     atorvastatin 20 MG tablet  Commonly known as:  LIPITOR  Take 1 tablet (20 mg total) by mouth daily at 6 PM.     azelastine 0.05 % ophthalmic solution  Commonly known as:  OPTIVAR  Place 2 drops into both eyes 2 (two) times daily.     diltiazem 180 MG 24 hr capsule  Commonly known as:  CARDIZEM CD  Take 1 capsule (180 mg total) by mouth daily.  Start taking on:  05/23/2014     ferrous sulfate 325 (65 FE) MG tablet  Take 1 tablet (325 mg total) by mouth 2 (two) times daily with a meal.     furosemide 40 MG tablet  Commonly known as:  LASIX  Take 1 tablet (40 mg total) by mouth daily.     glucose blood test strip  Use  to check BG daily     insulin aspart 100 UNIT/ML injection  Commonly known as:  novoLOG  Inject 0-9 Units into the skin 3 (three) times daily with meals. Sliding scale CBG 70 - 120: 0 units CBG 121 - 150: 1 unit,  CBG 151 - 200: 2 units,  CBG 201 - 250: 3 units,  CBG 251 - 300: 5 units,  CBG 301 - 350: 7 units,  CBG 351 - 400: 9 units   CBG > 400: 9 units and notify your MD     KLOR-CON M20 20 MEQ tablet  Generic drug:  potassium chloride SA  Take 10 mEq by mouth daily.     metoprolol tartrate 25 MG tablet  Commonly known as:  LOPRESSOR  Take 1 tablet (25 mg total) by mouth 2 (two) times daily.     omeprazole 20 MG capsule  Commonly known as:  PRILOSEC  TAKE 1 CAPSULE (20 MG TOTAL) BY MOUTH DAILY.     ONETOUCH DELICA LANCETS 96E Misc  1 each by Does not apply route daily. Use one lancet to check BG daily     polyvinyl alcohol 1.4 % ophthalmic solution  Commonly known as:  LIQUIFILM TEARS  Place 1 drop  into both eyes as needed for dry eyes.     warfarin 10 MG tablet  Commonly known as:  COUMADIN  Take 1 tablet (10 mg total) by mouth daily at 6 PM.         Brief H and P: For complete details please refer to admission H and P, but in briefPatient is a 63 year old male with diabetes, hypertension, hyperlipidemia, chronic diastolic CHF, atrial fibrillation on Coumadin, colon cancer status post resection, obstructive sleep apnea, chronic respiratory failure with 2 L O2 via nasal cannula, renal failure who presented to ED on 5/13 with shortness of breath bradycardia and hypotension. Chest x-ray showed pulmonary edema with right pleural effusion. He has history of obstructive sleep apnea, noncompliant with CPAP. Patient was placed on BiPAP in ED and subsequently required intubation. Patient was admitted to ICU by the critical care service. He required vasopressors. He was also found to be hyperkalemic refractory to medical treatment and required hemodialysis once. Patient was transferred  to the hospitalist service on 5/16.  Hospital Course:  Acute on chronic respiratory failure with hypoxia: Multifactorial likely due to acute on chronic diastolic CHF exacerbation, hypoxia due to obstructive sleep apnea and noncompliance with CPAP.   Patient was placed on BiPAP in ED and subsequently required intubation and mechanical ventilation. He was admitted to ICU by the critical care service. Patient also required vasopressors. Patient was also found to be hyperkalemic refractory to medical treatment. Nephrology was consulted and he underwent hemodialysis once. Patient was subsequently extubated and has been satting well. Patient was followed by cardiology closely and recommended Lasix sliding scale as per instructions. He has cardiology follow-up appointment scheduled on 5/27.    Acute diastolic CHF exacerbation  2-D echo showed EF of 65-70%, no regional wall motion abnormalities. Patient was closely followed by cardiology. He was placed on IV diuresis, has a negative balance of 9.46 L. Weight is down from 290lbs to 261lbs. case management was consulted for [facility] CHF management.    DM type 2, uncontrolled, with renal complications:  hemoglobin A1c 8.1 in 2/16, patient was strongly counseled to be compliant with insulin   Morbid obesity Patient counseled on diet and weight control   Chronic atrial fibrillation - Currently rate controlled, some bradycardia during this hospitalization with positive, longest 2.5 seconds. Calcium channel blocker was decreased by cardiology, placed on warfarin. Continue beta blocker. INR 1.5, continue Coumadin, patient should have repeat PT INR at the follow-up appointment    Hypotension - Improved, continue metoprolol, Lasix, Cardizem   OSA (obstructive sleep apnea) - Patient reports that CPAP machine at home is broken, discussed with case manager and he was provided with the number to call the company  Acute on chronic renal failure  -Creatinine  currently stable, improving . Creatinine was 2.5 at the time of admission , improved to 1.1  Day of Discharge BP 141/74 mmHg  Pulse 73  Temp(Src) 97.8 F (36.6 C) (Oral)  Resp 20  Ht 5' 7"  (1.702 m)  Wt 118.525 kg (261 lb 4.8 oz)  BMI 40.92 kg/m2  SpO2 94%  Physical Exam: General: Alert and awake oriented x3 not in any acute distress. HEENT: anicteric sclera, pupils reactive to light and accommodation CVS: S1-S2 clear no murmur rubs or gallops Chest: clear to auscultation bilaterally, no wheezing rales or rhonchi Abdomen: soft nontender, nondistended, normal bowel sounds Extremities: no cyanosis, clubbing or edema noted bilaterally Neuro: Cranial nerves II-XII intact, no focal neurological deficits   The results of significant  diagnostics from this hospitalization (including imaging, microbiology, ancillary and laboratory) are listed below for reference.    LAB RESULTS: Basic Metabolic Panel:  Recent Labs Lab 05/18/14 0455  05/20/14 0442 05/21/14 1623  NA 144  < > 142 140  K 5.0  < > 4.0 3.9  CL 98*  < > 98* 99*  CO2 39*  < > 36* 34*  GLUCOSE 95  < > 93 158*  BUN 39*  < > 26* 23*  CREATININE 1.48*  < > 1.17 1.18  CALCIUM 8.8*  < > 8.8* 8.8*  MG  --   --  1.7 1.8  PHOS 5.2*  --   --   --   < > = values in this interval not displayed. Liver Function Tests:  Recent Labs Lab 05/16/14 1550 05/18/14 0455  ALBUMIN 3.3* 3.0*   No results for input(s): LIPASE, AMYLASE in the last 168 hours. No results for input(s): AMMONIA in the last 168 hours. CBC:  Recent Labs Lab 05/16/14 0618  05/17/14 0500 05/20/14 0442  WBC 8.3  --  6.2 5.2  NEUTROABS 5.6  --   --   --   HGB 9.6*  < > 9.7* 9.5*  HCT 35.0*  < > 32.4* 33.8*  MCV 93.3  --  86.4 91.8  PLT 178  --  174 136*  < > = values in this interval not displayed. Cardiac Enzymes: No results for input(s): CKTOTAL, CKMB, CKMBINDEX, TROPONINI in the last 168 hours. BNP: Invalid input(s): POCBNP CBG:  Recent  Labs Lab 05/21/14 2110 05/22/14 0740  GLUCAP 181* 105*    Significant Diagnostic Studies:  Dg Chest Port 1 View  05/17/2014   CLINICAL DATA:  CHF  EXAM: PORTABLE CHEST - 1 VIEW  COMPARISON:  Yesterday  FINDINGS: Tubular device is stable. Mild cardiomegaly. Small right pleural effusion stable. Vascular congestion and edema have slightly improved. No pneumothorax.  IMPRESSION: Slightly improved edema. Otherwise stable including a small right pleural effusion.   Electronically Signed   By: Marybelle Killings M.D.   On: 05/17/2014 08:20   Dg Chest Port 1 View  05/16/2014   CLINICAL DATA: Left IJ dialysis catheter placement.  EXAM: PORTABLE CHEST - 1 VIEW  COMPARISON:  One-view chest x-ray from the same day.  FINDINGS: The endotracheal tube terminates 7 point 4 cm above the carina. A right IJ line is stable. The NG tube courses off the inferior border the film.  A new left IJ dialysis catheter terminates in the midline, presumably within the innominate vein. Heart is enlarged. Mild generalized edema is stable. Bilateral effusions are again noted, right greater than left.  IMPRESSION: 1. Interval placement of left IJ dialysis catheter. The tip terminates at the midline, presumably within the innominate vein. 2. Support apparatus is otherwise stable. 3. Stable cardiomegaly. 4. Mild to moderate generalized edema. 5. Right greater than left pleural effusions are stable.   Electronically Signed   By: San Morelle M.D.   On: 05/16/2014 15:09   Dg Chest Portable 1 View  05/16/2014   CLINICAL DATA:  Hypoxia  EXAM: PORTABLE CHEST - 1 VIEW  COMPARISON:  May 16, 2014  FINDINGS: Endotracheal tube tip is 4.6 cm above the carina. Central catheter tip is in the superior vena cava. Nasogastric tube tip and side port are below the diaphragm. No pneumothorax. There are bilateral pleural effusions with cardiomegaly and pulmonary venous hypertension. There is left lower lobe consolidation.  IMPRESSION: Tube and catheter  positions as  described without pneumothorax. Evidence of congestive heart failure. Opacity in the left base with consolidation may represent alveolar edema. Superimposed pneumonia is concerning in the left lower lobe.   Electronically Signed   By: Lowella Grip III M.D.   On: 05/16/2014 08:42   Dg Chest Port 1 View  05/16/2014   CLINICAL DATA:  Respiratory distress.  CHF.  EXAM: PORTABLE CHEST - 1 VIEW  COMPARISON:  04/17/2014  FINDINGS: Shallow inspiration. Cardiac enlargement with pulmonary vascular congestion. Bilateral pleural effusions with atelectasis in the lung bases. Perihilar airspace disease suggest edema. No pneumothorax. Calcification of the aorta.  IMPRESSION: Cardiac enlargement with pulmonary vascular congestion, bilateral pleural effusions, and perihilar edema.   Electronically Signed   By: Lucienne Capers M.D.   On: 05/16/2014 06:31    2D ECHO:  Impressions:  - Compared to the prior echo in 04/2014, the EF is higher at 65-70%, otherwise there are no significant changes. The RV is mildly enlarged and mildly hypokinetic. The IVC is dilated, however, this is in the setting of PPV. There is biatrial enlargement and mild concentric LVH.  Disposition and Follow-up:     Discharge Instructions    (HEART FAILURE PATIENTS) Call MD:  Anytime you have any of the following symptoms: 1) 3 pound weight gain in 24 hours or 5 pounds in 1 week 2) shortness of breath, with or without a dry hacking cough 3) swelling in the hands, feet or stomach 4) if you have to sleep on extra pillows at night in order to breathe.    Complete by:  As directed      Diet - low sodium heart healthy    Complete by:  As directed      Diet Carb Modified    Complete by:  As directed      Discharge instructions    Complete by:  As directed   Sliding scale Lasix: Weigh yourself when you get home, then Daily in the Morning. Your "Dry Weight" is what you weigh upon arrival home.      If you gain more  than 3 pounds from dry weight: Increase the Lasix dosing to 40 mg in the morning and 40 mg in the afternoon until weight returns to baseline dry weight.     If weight gain is greater than 5 pounds in 2 days: Increased to Lasix 80 mg in the morning and 40 mg in the afternoon and contact the office for further assistance if weight does not go down the next day.     If the weight goes down more than 3 pounds from dry weight: Hold Lasix until it returns to baseline dry weight     Increase activity slowly    Complete by:  As directed             DISPOSITION: home with home health PT, OT   DISCHARGE FOLLOW-UP Follow-up Information    Follow up with Richardson Dopp, PA-C On 05/30/2014.   Specialties:  Physician Assistant, Radiology, Interventional Cardiology   Why:  @8 :00 for [facility] - cardiology   Contact information:   [address] [address] [phone]       Follow up with Redge Gainer, MD On 06/04/2014.   Specialty:  Family Medicine   Why:  for hospital follow-up/ Appointment with Dr. Laurance Flatten is on June 1st at 12:10   Contact information:   [address] [place] [address] [phone]  Follow up with [facility].   Why:  PT OT RN, will call you within 24-48 hours of discharge to schedule first home visit.   Contact information:   [address] [place] [address] [phone]       Follow up with [facility].   Why:  rollator walker delivered to room prior to discharge   Contact information:   [address] [place] [address] [phone]        Time spent on Discharge: 38 mins  Signed:   RAI,RIPUDEEP M.D. [facility] 05/22/2014, 12:14 PM Pager: [phone]

## 2014-05-22 NOTE — Progress Notes (Signed)
Nsg Discharge Note  Admit Date:  05/16/2014 Discharge date: 05/22/2014   William Flores to be D/C'd Home per MD order.  AVS completed.  Copy for chart, and copy for patient signed, and dated. Patient/caregiver able to verbalize understanding.  Discharge Medication:   Medication List    STOP taking these medications        benzonatate 100 MG capsule  Commonly known as:  TESSALON     lisinopril 5 MG tablet  Commonly known as:  PRINIVIL,ZESTRIL     metolazone 2.5 MG tablet  Commonly known as:  ZAROXOLYN      TAKE these medications        allopurinol 100 MG tablet  Commonly known as:  ZYLOPRIM  Take 100 mg by mouth daily.     atorvastatin 20 MG tablet  Commonly known as:  LIPITOR  Take 1 tablet (20 mg total) by mouth daily at 6 PM.     azelastine 0.05 % ophthalmic solution  Commonly known as:  OPTIVAR  Place 2 drops into both eyes 2 (two) times daily.     diltiazem 180 MG 24 hr capsule  Commonly known as:  CARDIZEM CD  Take 1 capsule (180 mg total) by mouth daily.  Start taking on:  05/23/2014     ferrous sulfate 325 (65 FE) MG tablet  Take 1 tablet (325 mg total) by mouth 2 (two) times daily with a meal.     furosemide 40 MG tablet  Commonly known as:  LASIX  Take 1 tablet (40 mg total) by mouth daily.     glucose blood test strip  Use to check BG daily     insulin aspart 100 UNIT/ML injection  Commonly known as:  novoLOG  Inject 0-9 Units into the skin 3 (three) times daily with meals. Sliding scale CBG 70 - 120: 0 units CBG 121 - 150: 1 unit,  CBG 151 - 200: 2 units,  CBG 201 - 250: 3 units,  CBG 251 - 300: 5 units,  CBG 301 - 350: 7 units,  CBG 351 - 400: 9 units   CBG > 400: 9 units and notify your MD     KLOR-CON M20 20 MEQ tablet  Generic drug:  potassium chloride SA  Take 10 mEq by mouth daily.     metoprolol tartrate 25 MG tablet  Commonly known as:  LOPRESSOR  Take 1 tablet (25 mg total) by mouth 2 (two) times daily.     omeprazole 20 MG capsule   Commonly known as:  PRILOSEC  TAKE 1 CAPSULE (20 MG TOTAL) BY MOUTH DAILY.     ONETOUCH DELICA LANCETS 67Y Misc  1 each by Does not apply route daily. Use one lancet to check BG daily     polyvinyl alcohol 1.4 % ophthalmic solution  Commonly known as:  LIQUIFILM TEARS  Place 1 drop into both eyes as needed for dry eyes.     warfarin 10 MG tablet  Commonly known as:  COUMADIN  Take 1 tablet (10 mg total) by mouth daily at 6 PM.        Discharge Assessment: Filed Vitals:   05/22/14 0505  BP: 141/74  Pulse: 73  Temp: 97.8 F (36.6 C)  Resp: 20   Skin clean, dry and intact without evidence of skin break down, no evidence of skin tears noted. IV catheter discontinued intact. Site without signs and symptoms of complications - no redness or edema noted at insertion site, patient denies  c/o pain - only slight tenderness at site.  Dressing with slight pressure applied.  D/c Instructions-Education: Discharge instructions given to patient/family with verbalized understanding. D/c education completed with patient/family including follow up instructions, medication list, d/c activities limitations if indicated, with other d/c instructions as indicated by MD - patient able to verbalize understanding, all questions fully answered. Patient instructed to return to ED, call 911, or call MD for any changes in condition.  Patient escorted via [place], and D/C home via private auto.  Dayle Points, RN 05/22/2014 12:30 PM

## 2014-05-22 NOTE — Progress Notes (Signed)
ANTICOAGULATION CONSULT NOTE - Follow Up Consult  Pharmacy Consult for Warfarin Indication: atrial fibrillation  Allergies  Allergen Reactions  . Codeine     Headache     Patient Measurements: Height: 5' 7"  (170.2 cm) Weight: 261 lb 4.8 oz (118.525 kg) IBW/kg (Calculated) : 66.1  Vital Signs: Temp: 97.8 F (36.6 C) (05/19 0505) Temp Source: Oral (05/19 0505) BP: 141/74 mmHg (05/19 0505) Pulse Rate: 73 (05/19 0505)  Labs:  Recent Labs  05/20/14 0442 05/21/14 0541 05/21/14 1623 05/22/14 0545  HGB 9.5*  --   --   --   HCT 33.8*  --   --   --   PLT 136*  --   --   --   LABPROT 21.7* 19.3*  --  18.7*  INR 1.88* 1.61*  --  1.54*  CREATININE 1.17  --  1.18  --     Estimated Creatinine Clearance: 80 mL/min (by C-G formula based on Cr of 1.18).   Medical History: Past Medical History  Diagnosis Date  . Diabetes   . Colon cancer   . Hypertension   . Sleep apnea   . Chronic atrial fibrillation   . Hyperlipidemia   . Chronic diastolic heart failure     HFPF - EF ~65-70% (OFTEN EXACERBATED BY AFIB)  . Colon polyps 09/21/2012    Tubular adenoma  . Obesity hypoventilation syndrome     Medications:  Prescriptions prior to admission  Medication Sig Dispense Refill Last Dose  . allopurinol (ZYLOPRIM) 100 MG tablet Take 100 mg by mouth daily.  3 05/16/2014 at Unknown time  . azelastine (OPTIVAR) 0.05 % ophthalmic solution Place 2 drops into both eyes 2 (two) times daily. (Patient taking differently: Place 2 drops into both eyes daily as needed. ) 6 mL 12 Past Week at Unknown time  . benzonatate (TESSALON) 100 MG capsule Take 1 capsule (100 mg total) by mouth 2 (two) times daily as needed for cough. 20 capsule 0 Past Week at Unknown time  . diltiazem (CARDIZEM CD) 360 MG 24 hr capsule Take 360 mg by mouth daily.  5 05/16/2014 at Unknown time  . ferrous sulfate 325 (65 FE) MG tablet Take 1 tablet (325 mg total) by mouth 2 (two) times daily with a meal.  3 05/16/2014 at  Unknown time  . insulin aspart (NOVOLOG) 100 UNIT/ML injection Inject 0-9 Units into the skin 3 (three) times daily with meals. Sliding scale CBG 70 - 120: 0 units CBG 121 - 150: 1 unit,  CBG 151 - 200: 2 units,  CBG 201 - 250: 3 units,  CBG 251 - 300: 5 units,  CBG 301 - 350: 7 units,  CBG 351 - 400: 9 units   CBG > 400: 9 units and notify your MD 10 mL 11 05/16/2014 at Unknown time  . KLOR-CON M20 20 MEQ tablet Take 10 mEq by mouth daily.  11 05/16/2014 at Unknown time  . lisinopril (PRINIVIL,ZESTRIL) 5 MG tablet Take 5 mg by mouth daily.  11 05/16/2014 at Unknown time  . metolazone (ZAROXOLYN) 2.5 MG tablet Take 2.5 mg by mouth 2 (two) times daily.   2 05/16/2014 at Unknown time  . omeprazole (PRILOSEC) 20 MG capsule TAKE 1 CAPSULE (20 MG TOTAL) BY MOUTH DAILY. 30 capsule 4 05/16/2014 at Unknown time  . polyvinyl alcohol (LIQUIFILM TEARS) 1.4 % ophthalmic solution Place 1 drop into both eyes as needed for dry eyes. 15 mL 0 Past Month at Unknown time  . warfarin (  COUMADIN) 10 MG tablet Take 1 tablet (10 mg total) by mouth daily at 6 PM. 30 tablet 0 05/17/2014 at Unknown time  . [DISCONTINUED] furosemide (LASIX) 40 MG tablet Take 40 mg by mouth daily.  3 05/16/2014 at Unknown time  . [DISCONTINUED] metoprolol tartrate (LOPRESSOR) 25 MG tablet Take 1 tablet (25 mg total) by mouth every 8 (eight) hours. (Patient taking differently: Take 25 mg by mouth 2 (two) times daily. )   05/16/2014 at 0830  . atorvastatin (LIPITOR) 20 MG tablet Take 1 tablet (20 mg total) by mouth daily at 6 PM. 30 tablet 0 05/15/2014  . benzonatate (TESSALON) 100 MG capsule TAKE 1 CAPSULE (100 MG TOTAL) BY MOUTH 2 (TWO) TIMES DAILY. (Patient not taking: Reported on 05/17/2014) 20 capsule 0 Not Taking at Unknown time  . diltiazem (CARDIZEM CD) 300 MG 24 hr capsule Take 1 capsule (300 mg total) by mouth daily. (Patient not taking: Reported on 05/17/2014)   Not Taking at Unknown time  . glucose blood test strip Use to check BG daily 100 each 2  unknown  . ONETOUCH DELICA LANCETS 46W MISC 1 each by Does not apply route daily. Use one lancet to check BG daily 100 each 2 unknown    Assessment: 72 YOM who presented with SOB, hypotension and bradycardia and intubated in ED. Patient has a history of Atrial fibrillation and was supposed to be on Coumadin prior to admission. His INR on admission was sub-therapeutic at 1.74 likely due to non-compliance. Pharmacy consulted to resume his Coumadin therapy.   His INR today is down further to 1.54. Anticipated INR to start trending up today. Unsure why it continues to trend down. No interacting meds noted that would cause INR to dip. No missed doses. Eating 100% of meals   Home Coumadin dose (per clinic notes): 7.5 mg daily except 10 mg on Mon/Fri.   Goal of Therapy:  INR 2-3 Monitor platelets by anticoagulation protocol: Yes   Plan:  -Repeat Coumadin boost of 12.5 mg today. Hopefully INR will start trending back up again tomorrow  -Monitor daily PT/INR   Albertina Parr, PharmD., BCPS Clinical Pharmacist Pager [phone]

## 2014-05-23 ENCOUNTER — Telehealth: Payer: Self-pay

## 2014-05-23 MED ORDER — ATORVASTATIN CALCIUM 20 MG PO TABS: 20.0000 mg | ORAL_TABLET | Freq: Every day | ORAL | Status: DC

## 2014-05-23 NOTE — Telephone Encounter (Signed)
Patient contacted regarding discharge from [facility] hospital 05/22/14.  Patient understands to follow up with Richardson Dopp PA 05/30/14 at 8:00 am at [facility] office. Patient understands discharge instructions? yes  Patient understands medications and regiment? yes  Patient understands to bring all medications to this visit? yes

## 2014-05-23 NOTE — Telephone Encounter (Signed)
Received call from patient he stated he wanted to review medication again.Discharge medications reviewed, he voiced understanding.Stated he needed refill for atorvastatin.Refill sent to pharmacy.Advised to bring medications to post hospital appointment with Richardson Dopp PA 05/30/14 at 8:00 am [facility] office.

## 2014-05-29 NOTE — Progress Notes (Signed)
Cardiology Office Note   Date:  05/30/2014   ID:  CARDER YIN, DOB August 14, 1950, MRN [id number]  PCP:  Redge Gainer, MD  Cardiologist:  Dr. Quay Burow >> would like to see Dr. Minus Breeding in [place] (close to home)   Chief Complaint  Patient presents with  . Congestive Heart Failure     History of Present Illness: William Flores is a 64 y.o. male with a hx of chronic atrial fibrillation on coumadin, diastolic HF, no significant CAD at [facility] in 2008, diabetes, sleep apnea, obesity, colon CA status post resection.   Admitted 02/2014 with hypoxic respiratory failure in the setting of a/c combined systolic and diastolic CHF and possible pneumonia complicated by acute renal failure. His course was complicated by AF with RVR and worsening LV function with an EF of 35-40% that was suspected to be a tachy induced cardiomyopathy. He was not felt to be a good candidate for amiodarone given baseline pulmonary issues.   Admitted 04/2014 with hypotension and AF with slow VR in the setting of AKI and multiple metabolic derangements.  He required CVVHD due to worsening renal function and volume excess/pulmonary edema.  Echo demonstrated recovery of LVF with EF 60-65%.    Admitted again 5/13-5/19 with a/c hypoxic respiratory failure requiring intubation due to a/c diastolic HF and hypoxia due to noncompliance with CPAP c/b hyperkalemia in the setting of a/c renal failure requiring dialysis x 1.  He lost 29 lbs and DC weight was 261 lbs. Of note he did have bradycardia and 2 sec pauses requiring reduction in CCB dose.     He returns for FU. He is feeling better since DC.  He denies any worsening SOB.  He is NYHA 2b.  He denies chest pain.  Denies orthopnea, PND.  LE edema remains improved.  He is weighing daily.  His weights have been stable.  He is waiting on his PCP to renew his order for his CPAP.  His current machine is not working.  He denies syncope.     Studies/Reports Reviewed  Today:  Echo 05/16/14 - mildconcentric hypertrophy. Systolic function was vigorous. EF 65% to 70%. Wallmotion was normal; The study is not technically sufficient to allow evaluation of LV diastolic function. - Aortic valve: Transvalvular velocity was minimally increased. Mean gradient (S): 9 mm Hg. Peak gradient (S): 59m Hg. - Mitral valve: Heavily calcified subvalvular apparatus. MAC. Mildregurgitation. - Left atrium: Moderately dilated at 47 ml/m2. - Right ventricle: The cavity size was mildly dilated. Mildly reduced systolic function. Lateral annulus peak S velocity: 11.1cm/s. - Right atrium: Severely dilated at 31 cm2. - Inferior vena cava: The vessel was dilated. The respirophasic diameter changes were blunted (< 50%), consistent with elevated central venous pressure - or due to positive pressure ventilation.  Impressions:   Compared to the prior echo in 04/2014, the EF is higher at 65-70%, otherwise there are no significant changes. The RV is mildly enlarged and mildly hypokinetic. The IVC is dilated, however, this is in the setting of PPV. There is biatrial enlargement and mild concentric LVH.  Echo 02/12/14 - EF 35-40%.  - mild LAE - mild reduced RVF  Cardiac cath 02/2006 SELECTIVE CORONARY ANGIOGRAPHY: 1. Left main normal. 2. LAD normal. 3. Left circumflex normal. 4. Ramus intermedius branch normal. 5. Right coronary artery was dominant with a 50% fairly focal mid PDA lesion, otherwise was normal. LEFT VENTRICULOGRAPHY: LVEF 25% with moderate to severe global hypokinesia.  Past Medical History  Diagnosis  Date  . Diabetes   . Colon cancer   . Hypertension   . Sleep apnea   . Chronic atrial fibrillation   . Hyperlipidemia   . Chronic diastolic heart failure     HFPF - EF ~65-70% (OFTEN EXACERBATED BY AFIB)  . Colon polyps 09/21/2012    Tubular adenoma  . Obesity hypoventilation syndrome   . History of echocardiogram     Echo 05/16/14:  mild LVH, vigorous  LVF, EF 65-70%, no RWMA, mean AV gradient 9 mmHg, MAC, mild MR, mod LAE, mild RVE, mild reduced RVF, severe RAE  . Cardiomyopathy     EF 35-40% in 2/16 in setting of AF with RVR >> echo 5/16 with improved LVF with EF 65-70%  . CAD (coronary artery disease)     LHC 2/08:  mRCA 50%, o/w no sig CAD, EF 25%  . CKD (chronic kidney disease)     hx of worsening renal failure and hyperK+ in setting of acute diast CHF >> required CVVHD in 4/16 and dialysis x 1 in 5/16    Past Surgical History  Procedure Laterality Date  . Colon resection    . Circumcision    . Colonoscopy N/A 09/21/2012    Procedure: COLONOSCOPY;  Surgeon: Inda Castle, MD;  Location: [facility];  Service: Endoscopy;  Laterality: N/A;  . US echocardiography  03/04/2010    abnormal study     Current Outpatient Prescriptions  Medication Sig Dispense Refill  . allopurinol (ZYLOPRIM) 100 MG tablet Take 100 mg by mouth daily.  3  . atorvastatin (LIPITOR) 20 MG tablet Take 1 tablet (20 mg total) by mouth daily at 6 PM. 30 tablet 6  . azelastine (OPTIVAR) 0.05 % ophthalmic solution Place 2 drops into both eyes 2 (two) times daily. (Patient taking differently: Place 2 drops into both eyes daily as needed. ) 6 mL 12  . diltiazem (CARDIZEM CD) 180 MG 24 hr capsule Take 1 capsule (180 mg total) by mouth daily. 30 capsule 3  . ferrous sulfate 325 (65 FE) MG tablet Take 1 tablet (325 mg total) by mouth 2 (two) times daily with a meal.  3  . furosemide (LASIX) 40 MG tablet Take 1 tablet (40 mg total) by mouth daily. 30 tablet 3  . glucose blood test strip Use to check BG daily 100 each 2  . insulin aspart (NOVOLOG) 100 UNIT/ML injection Inject 0-9 Units into the skin 3 (three) times daily with meals. Sliding scale CBG 70 - 120: 0 units CBG 121 - 150: 1 unit,  CBG 151 - 200: 2 units,  CBG 201 - 250: 3 units,  CBG 251 - 300: 5 units,  CBG 301 - 350: 7 units,  CBG 351 - 400: 9 units   CBG > 400: 9 units and notify your MD 10 mL 11  . KLOR-CON  M20 20 MEQ tablet Take 10 mEq by mouth daily.  11  . metoprolol tartrate (LOPRESSOR) 25 MG tablet Take 1 tablet (25 mg total) by mouth 2 (two) times daily. 60 tablet 2  . omeprazole (PRILOSEC) 20 MG capsule TAKE 1 CAPSULE (20 MG TOTAL) BY MOUTH DAILY. 30 capsule 4  . ONETOUCH DELICA LANCETS 16X MISC 1 each by Does not apply route daily. Use one lancet to check BG daily 100 each 2  . polyvinyl alcohol (LIQUIFILM TEARS) 1.4 % ophthalmic solution Place 1 drop into both eyes as needed for dry eyes. 15 mL 0  . warfarin (COUMADIN) 10 MG  tablet Take 1 tablet (10 mg total) by mouth daily at 6 PM. 30 tablet 0  . [DISCONTINUED] diltiazem (TIAZAC) 240 MG 24 hr capsule TAKE 1 CAPSULE DAILY 30 capsule 3   No current facility-administered medications for this visit.    Allergies:   Codeine    Social History:  The patient  reports that he quit smoking about 30 years ago. He has never used smokeless tobacco. He reports that he does not drink alcohol or use illicit drugs.   Family History:  The patient's family history includes Breast cancer in his mother; Diabetes in his mother; Heart attack in his father.    ROS:   Please see the history of present illness.   Review of Systems  Eyes: Positive for visual disturbance.  Cardiovascular: Positive for leg swelling.  Musculoskeletal: Positive for joint pain.  All other systems reviewed and are negative.    PHYSICAL EXAM: VS:  BP 130/70 mmHg  Pulse 71  Ht 5' 4"  (1.626 m)  Wt 259 lb 1.9 oz (117.536 kg)  BMI 44.46 kg/m2    Wt Readings from Last 3 Encounters:  05/30/14 259 lb 1.9 oz (117.536 kg)  05/22/14 261 lb 4.8 oz (118.525 kg)  04/23/14 282 lb 13.6 oz (128.3 kg)     GEN: Well nourished, well developed, in no acute distress HEENT: normal Neck: I cannot appreciate JVD, no masses Cardiac:  Normal S1/S2, Irregularly irregular rhythm; 1/6 systolic murmur RUSB,  no rubs or gallops, tr-1+ bilateral LE edema with chronic stasis changes Respiratory:   Decreased breath sounds bilaterally, no wheezing, rhonchi or rales. GI: soft, nontender, nondistended, + BS MS: no deformity or atrophy Skin: warm and dry  Neuro:  CNs II-XII intact, Strength and sensation are intact Psych: Normal affect   EKG:  EKG is ordered today.  It demonstrates:   AFib, HR 71, rightward axis, no change from last tracing   Recent Labs: 10/28/2013: Pro B Natriuretic peptide (BNP) 325.7* 04/13/2014: ALT 20 04/14/2014: TSH 3.662 05/17/2014: B Natriuretic Peptide 320.6* 05/20/2014: Hemoglobin 9.5*; Platelets 136* 05/21/2014: BUN 23*; Creatinine 1.18; Magnesium 1.8; Potassium 3.9; Sodium 140    Lipid Panel    Component Value Date/Time   CHOL 150 02/11/2014 0750   TRIG 130 02/19/2014 0011   TRIG 177* 07/18/2013 1547   HDL 20* 02/11/2014 0750   HDL 24* 07/18/2013 1547   CHOLHDL 7.5 02/11/2014 0750   VLDL 23 02/11/2014 0750   LDLCALC 107* 02/11/2014 0750   LDLCALC 67 07/18/2013 1547      ASSESSMENT AND PLAN:  Chronic diastolic CHF (congestive heart failure):  His volume remains stable.  Check  BMET today.  We discussed referral to the CHF Clinic.  I think he would benefit from this.  He saw Dr. Loralie Champagne in the hospital in April.  I will see if he can be seen by him in 2-3 weeks.   We discussed the importance of daily weights.  He understands to call if his weight increases suddenly.  We discussed the importance of limiting his salt.  We also discussed the importance of resuming his CPAP.   Chronic atrial fibrillation:  With labile renal function, I think he should remain on Coumadin for now.  We can consider NOAC Rx in the future if his renal function remains stable.  Rate is controlled.   OSA (obstructive sleep apnea):  We stressed the importance of adherence to CPAP.  He is waiting on his PCP to renew his Rx so he  can get a new machine.  He sees Dr. Laurance Flatten next week.  I stressed the importance of getting his CPAP resumed.    Essential hypertension:   Controlled.   Hyperlipidemia:  Continue statin.   Morbid obesity:    We discussed the importance of weight  Loss.   CKD (chronic kidney disease), unspecified stage:  Question if he had an element of cardiorenal syndrome during his most recent admissions.  With episodes of HyperK+, I'm not sure we should resume ACE inhibitor at this point.  We can consider over time.  Check BMET today.      Current medicines are reviewed at length with the patient today.  Concerns regarding medicines are as outlined above.  The following changes have been made:    None    Labs/ tests ordered today include:  Orders Placed This Encounter  Procedures  . Basic metabolic panel  . EKG 12-Lead    Disposition:   FU with CHF Clinic in 2-3 weeks.  He lives in [place] and would prefer to see Dr. Minus Breeding there.  FU with Dr. Minus Breeding 6-8 weeks.    Signed, Versie Starks, MHS 05/30/2014 8:29 AM    [facility] [address] [place], [place], [place]  [address] Phone: [phone]; Fax: [phone]

## 2014-05-30 ENCOUNTER — Telehealth: Payer: Self-pay | Admitting: *Deleted

## 2014-05-30 ENCOUNTER — Encounter: Payer: Self-pay | Admitting: Physician Assistant

## 2014-05-30 ENCOUNTER — Ambulatory Visit (INDEPENDENT_AMBULATORY_CARE_PROVIDER_SITE_OTHER): Payer: BLUE CROSS/BLUE SHIELD | Admitting: Physician Assistant

## 2014-05-30 VITALS — BP 130/70 | HR 71 | Ht 64.0 in | Wt 259.1 lb

## 2014-05-30 DIAGNOSIS — N189 Chronic kidney disease, unspecified: Secondary | ICD-10-CM

## 2014-05-30 DIAGNOSIS — I482 Chronic atrial fibrillation, unspecified: Secondary | ICD-10-CM

## 2014-05-30 DIAGNOSIS — E785 Hyperlipidemia, unspecified: Secondary | ICD-10-CM

## 2014-05-30 DIAGNOSIS — G4733 Obstructive sleep apnea (adult) (pediatric): Secondary | ICD-10-CM

## 2014-05-30 DIAGNOSIS — I5032 Chronic diastolic (congestive) heart failure: Secondary | ICD-10-CM

## 2014-05-30 DIAGNOSIS — I10 Essential (primary) hypertension: Secondary | ICD-10-CM | POA: Diagnosis not present

## 2014-05-30 LAB — BASIC METABOLIC PANEL
BUN: 19 mg/dL (ref 6–23)
CO2: 29 mEq/L (ref 19–32)
Calcium: 9.4 mg/dL (ref 8.4–10.5)
Chloride: 108 mEq/L (ref 96–112)
Creatinine, Ser: 1 mg/dL (ref 0.40–1.50)
GFR: 80.38 mL/min (ref >60.00)
Glucose, Bld: 94 mg/dL (ref 70–99)
Potassium: 4.6 mEq/L (ref 3.5–5.1)
Sodium: 142 mEq/L (ref 135–145)

## 2014-05-30 NOTE — Patient Instructions (Addendum)
Medication Instructions:  Your physician recommends that you continue on your current medications as directed. Please refer to the Current Medication list given to you today.   Labwork: TODAY BMET  Testing/Procedures: NONE  Follow-Up: 1. PER SCOTT WEAVER, PAC PT WOULD LIKE TO FOLLOW UP IN [facility] OFFICE WITH DR. HOCHREIN IN THE NEXT 6-8 WEEKS; (DR. BERRY PT); [facility] OFFICE CLOSER FOR PT  2. NEED TO FOLLOW UP IN THE HEART FAILURE CLINIC IN THE NEXT 2-3 WEEKS TO SEE DR. MCLEAN  Any Other Special Instructions Will Be Listed Below (If Applicable). MONITOR WEIGHT DAILY AND CALL IF WEIGHT IS UP 2-3 LB'S IN 1 DAY [phone]

## 2014-05-30 NOTE — Telephone Encounter (Signed)
lmom labs normal ; if any questions cb [phone]

## 2014-06-03 ENCOUNTER — Ambulatory Visit (INDEPENDENT_AMBULATORY_CARE_PROVIDER_SITE_OTHER): Payer: BLUE CROSS/BLUE SHIELD | Admitting: Family Medicine

## 2014-06-03 DIAGNOSIS — R262 Difficulty in walking, not elsewhere classified: Secondary | ICD-10-CM | POA: Diagnosis not present

## 2014-06-03 DIAGNOSIS — T8111XD Postprocedural  cardiogenic shock, subsequent encounter: Secondary | ICD-10-CM

## 2014-06-04 ENCOUNTER — Ambulatory Visit (INDEPENDENT_AMBULATORY_CARE_PROVIDER_SITE_OTHER): Payer: BLUE CROSS/BLUE SHIELD | Admitting: Family

## 2014-06-04 ENCOUNTER — Encounter: Payer: Self-pay | Admitting: Family

## 2014-06-04 VITALS — BP 149/85 | HR 61 | Temp 98.6°F | Ht 64.0 in | Wt 254.0 lb

## 2014-06-04 DIAGNOSIS — I509 Heart failure, unspecified: Secondary | ICD-10-CM

## 2014-06-04 DIAGNOSIS — E875 Hyperkalemia: Secondary | ICD-10-CM

## 2014-06-04 DIAGNOSIS — I50810 Right heart failure, unspecified: Secondary | ICD-10-CM

## 2014-06-04 DIAGNOSIS — I482 Chronic atrial fibrillation, unspecified: Secondary | ICD-10-CM

## 2014-06-04 DIAGNOSIS — R231 Pallor: Secondary | ICD-10-CM

## 2014-06-04 LAB — POCT CBC
GRANULOCYTE PERCENT: 79.4 % (ref 37–80)
HEMATOCRIT: 34.8 % — AB (ref 43.5–53.7)
Hemoglobin: 10.4 g/dL — AB (ref 14.1–18.1)
LYMPH, POC: 0.9 (ref 0.6–3.4)
MCH: 25.6 pg — AB (ref 27–31.2)
MCHC: 29.9 g/dL — AB (ref 31.8–35.4)
MCV: 85.6 fL (ref 80–97)
MPV: 7.6 fL (ref 0–99.8)
POC Granulocyte: 4.8 (ref 2–6.9)
POC LYMPH PERCENT: 14.2 %L (ref 10–50)
Platelet Count, POC: 214 10*3/uL (ref 142–424)
RBC: 4.07 M/uL — AB (ref 4.69–6.13)
RDW, POC: 21 %
WBC: 6.1 10*3/uL (ref 4.6–10.2)

## 2014-06-04 LAB — POCT INR: INR: 1.6

## 2014-06-04 NOTE — Progress Notes (Addendum)
   Subjective:    Patient ID: William Flores, male    DOB: [birth year], 64 y.o.   MRN: [id number]  HPI Pt presents to the office for hospital follow up for hyperkalemia and CHF. Pt states he is feeling better today. Pt states his breathing is improved and states he has lost some weight. Pt states since leaving the hospital he has lost 6 lbs. Pt encouraged to weight himself everyday. Pt has O2 that he uses "as needed". Pt states he uses 2 L when he walks outside or if he is feeling SOB. Pt denies any headache, palpitations, or edema at this time.     Review of Systems  Constitutional: Negative.   HENT: Negative.   Respiratory: Positive for shortness of breath (at times).   Cardiovascular: Negative.   Gastrointestinal: Negative.   Endocrine: Negative.   Genitourinary: Negative.   Musculoskeletal: Negative.   Neurological: Negative.   Hematological: Negative.   Psychiatric/Behavioral: Negative.   All other systems reviewed and are negative.      Objective:   Physical Exam  Constitutional: He is oriented to person, place, and time. He appears well-developed and well-nourished. No distress.  HENT:  Head: Normocephalic.  Right Ear: External ear normal.  Left Ear: External ear normal.  Mouth/Throat: Oropharynx is clear and moist.  Eyes: Pupils are equal, round, and reactive to light. Right eye exhibits no discharge. Left eye exhibits no discharge.  Neck: Normal range of motion. Neck supple. No thyromegaly present.  Cardiovascular: Normal rate, regular rhythm, normal heart sounds and intact distal pulses.   No murmur heard. Pulmonary/Chest: Effort normal and breath sounds normal. No respiratory distress. He has no wheezes.  Abdominal: Soft. Bowel sounds are normal. He exhibits no distension. There is no tenderness.  Musculoskeletal: Normal range of motion. He exhibits no edema or tenderness.  Discoloration of BLE  Neurological: He is alert and oriented to person, place, and time. He  has normal reflexes. No cranial nerve deficit.  Skin: Skin is warm and dry. No rash noted. No erythema. There is pallor.  Psychiatric: He has a normal mood and affect. His behavior is normal. Judgment and thought content normal.  Vitals reviewed.     BP 149/85 mmHg  Pulse 61  Temp(Src) 98.6 F (37 C) (Oral)  Ht 5' 4"  (1.626 m)  Wt 254 lb (115.214 kg)  BMI 43.58 kg/m2     Assessment & Plan:  1. Right heart failure - CMP14+EGFR  2. Hyperkalemia - CMP14+EGFR  3. Pale - CMP14+EGFR - POCT CBC  4. Chronic atrial fibrillation - POCT INR  Patient requires non-invasive ventilator for acute on chronic respiratory failure as a consequent to copd. Without ventilator, there is a significant risk of untimely readmissions to the hospital and increase risk of CO2 retention  Continue current medications  Labs pending Discuss the importance of weighing daily and report any weight gain of 2lbs or greater daily or 5lb or greater in one week Low salt diet encouraged Keep follow up appointments with Tammy and Dr. Bernell List, FNP

## 2014-06-04 NOTE — Patient Instructions (Addendum)
Anticoagulation Dose Instructions as of 06/04/2014      Sun Mon Tue Wed Thu Fri Sat   New Dose 7.5 mg 10 mg 7.5 mg 10 mg 7.5 mg 10 mg 7.5 mg    Description        Increase warfarin dose to 25m - take 2 tablets on Mondays, Wednesdays and Fridays.  Take 1 and 1/2 tablets all other days.     INR was 1.6 today   Hyperkalemia Hyperkalemia is when you have too much potassium in your blood. This can be a life-threatening condition. Potassium is normally removed (excreted) from the body by the kidneys. CAUSES  The potassium level in your body can become too high for the following reasons:  You take in too much potassium. You can do this by:  Using salt substitutes. They contain large amounts of potassium.  Taking potassium supplements from your caregiver. The dose may be too high for you.  Eating foods or taking nutritional products with potassium.  You excrete too little potassium. This can happen if:  Your kidneys are not functioning properly. Kidney (renal) disease is a very common cause of hyperkalemia.  You are taking medicines that lower your excretion of potassium, such as certain diuretic medicines.  You have an adrenal gland disease called Addison's disease.  You have a urinary tract obstruction, such as kidney stones.  You are on treatment to mechanically clean your blood (dialysis) and you skip a treatment.  You release a high amount of potassium from your cells into your blood. You may have a condition that causes potassium to move from your cells to your bloodstream. This can happen with:  Injury to muscles or other tissues. Most potassium is stored in the muscles.  Severe burns or infections.  Acidic blood plasma (acidosis). Acidosis can result from many diseases, such as uncontrolled diabetes. SYMPTOMS  Usually, there are no symptoms unless the potassium is dangerously high or has risen very quickly. Symptoms may include:  Irregular or very slow  heartbeat.  Feeling sick to your stomach (nauseous).  Tiredness (fatigue).  Nerve problems such as tingling of the skin, numbness of the hands or feet, weakness, or paralysis. DIAGNOSIS  A simple blood test can measure the amount of potassium in your body. An electrocardiogram test of the heart can also help make the diagnosis. The heart may beat dangerously fast or slow down and stop beating with severe hyperkalemia.  TREATMENT  Treatment depends on how bad the condition is and on the underlying cause.  If the hyperkalemia is an emergency (causing heart problems or paralysis), many different medicines can be used alone or together to lower the potassium level briefly. This may include an insulin injection even if you are not diabetic. Emergency dialysis may be needed to remove potassium from the body.  If the hyperkalemia is less severe or dangerous, the underlying cause is treated. This can include taking medicines if needed. Your prescription medicines may be changed. You may also need to take a medicine to help your body get rid of potassium. You may need to eat a diet low in potassium. HOME CARE INSTRUCTIONS   Take medicines and supplements as directed by your caregiver.  Do not take any over-the-counter medicines, supplements, natural products, herbs, or vitamins without reviewing them with your caregiver. Certain supplements and natural food products can have high amounts of potassium. Other products (such as ibuprofen) can damage weak kidneys and raise your potassium.  You may be asked to  do repeat lab tests. Be sure to follow these directions.  If you have kidney disease, you may need to follow a low potassium diet. SEEK MEDICAL CARE IF:   You notice an irregular or very slow heartbeat.  You feel lightheaded.  You develop weakness that is unusual for you. SEEK IMMEDIATE MEDICAL CARE IF:   You have shortness of breath.  You have chest discomfort.  You pass out  (faint). MAKE SURE YOU:   Understand these instructions.  Will watch your condition.  Will get help right away if you are not doing well or get worse. Document Released: 12/10/2001 Document Revised: 03/14/2011 Document Reviewed: 03/27/2013 [facility] Patient Information 2015 [place], [place]. This information is not intended to replace advice given to you by your health care provider. Make sure you discuss any questions you have with your health care provider.

## 2014-06-05 LAB — CMP14+EGFR
ALT: 9 IU/L (ref 0–44)
AST: 14 IU/L (ref 0–40)
Albumin/Globulin Ratio: 1.6 (ref 1.1–2.5)
Albumin: 3.9 g/dL (ref 3.6–4.8)
Alkaline Phosphatase: 58 IU/L (ref 39–117)
BUN/Creatinine Ratio: 24 — ABNORMAL HIGH (ref 10–22)
BUN: 23 mg/dL (ref 8–27)
Bilirubin Total: 0.4 mg/dL (ref 0.0–1.2)
CO2: 28 mmol/L (ref 18–29)
Calcium: 9.4 mg/dL (ref 8.6–10.2)
Chloride: 103 mmol/L (ref 97–108)
Creatinine, Ser: 0.97 mg/dL (ref 0.76–1.27)
GFR calc Af Amer: 96 mL/min/{1.73_m2} (ref >59)
GFR calc non Af Amer: 83 mL/min/{1.73_m2} (ref >59)
Globulin, Total: 2.4 g/dL (ref 1.5–4.5)
Glucose: 112 mg/dL — ABNORMAL HIGH (ref 65–99)
POTASSIUM: 4.6 mmol/L (ref 3.5–5.2)
SODIUM: 143 mmol/L (ref 134–144)
Total Protein: 6.3 g/dL (ref 6.0–8.5)

## 2014-06-17 ENCOUNTER — Encounter (HOSPITAL_COMMUNITY): Payer: BLUE CROSS/BLUE SHIELD

## 2014-06-18 ENCOUNTER — Encounter: Payer: Self-pay | Admitting: Pharmacist

## 2014-06-18 ENCOUNTER — Ambulatory Visit (INDEPENDENT_AMBULATORY_CARE_PROVIDER_SITE_OTHER): Payer: BLUE CROSS/BLUE SHIELD | Admitting: Pharmacist

## 2014-06-18 VITALS — BP 128/82 | HR 73 | Ht 64.0 in | Wt 254.0 lb

## 2014-06-18 DIAGNOSIS — E1165 Type 2 diabetes mellitus with hyperglycemia: Secondary | ICD-10-CM

## 2014-06-18 DIAGNOSIS — IMO0002 Reserved for concepts with insufficient information to code with codable children: Secondary | ICD-10-CM

## 2014-06-18 DIAGNOSIS — I4891 Unspecified atrial fibrillation: Secondary | ICD-10-CM

## 2014-06-18 DIAGNOSIS — E1129 Type 2 diabetes mellitus with other diabetic kidney complication: Secondary | ICD-10-CM | POA: Diagnosis not present

## 2014-06-18 DIAGNOSIS — I482 Chronic atrial fibrillation, unspecified: Secondary | ICD-10-CM

## 2014-06-18 LAB — POCT GLYCOSYLATED HEMOGLOBIN (HGB A1C): HEMOGLOBIN A1C: 6.1

## 2014-06-18 LAB — POCT INR: INR: 1.6

## 2014-06-18 NOTE — Progress Notes (Signed)
Patient ID: William Flores, male   DOB: Jul 09, 1950, 64 y.o.   MRN: [id number]   Subjective/Objective    William Flores is a 64 y.o. male who presents for recheck INR.  He was recently hospitalized for congestive heart failure.  He is also due to have A1c and urine microalbumin for type 2 DM.  Patient's last A1c 02/2014 qas 8.1% adn last mircoalbumin was greater than 2000 (07/18/2013).  Checking BG at home about once daily.  Range is 103 to 179 (no over 200) Currently taking Janumet 50/1000mg 1 tablet BID. Has Novolog to use as needed for elevated but has not needed in last month. Patient denies any s/s of bleeding.  There are many recent medication changes noted in the last 2 weeks.    There were no vitals filed for this visit.  The following portions of the patient's history were reviewed and updated as appropriate: allergies, current medications, past family history, past medical history, past social history, past surgical history and problem list.    INR was 1.6 today  A1c was 6.1% today  Assessment:   Therapeutic Anticoagulation CHF - diastolic Type 2 DM - has met HBG and A1c goals Proteinuria - checking today  Plan:   1.   Anticoagulation Dose Instructions as of 06/18/2014      Dorene Grebe Tue Wed Thu Fri Sat   New Dose 7.5 mg 10 mg 7.5 mg 10 mg 10 mg 10 mg 7.5 mg    Description        Increase warfarin dose to 18m - take 2 tablets on Mondays, Wednesdays, Thursdays and Fridays.  Take 1 and 1/2 tablets all other days (Sundays, Tuesdays and Saturdays.     2.  Continue current medications for CHF and diabetes at current doses.   3.  Reminded patient to follow low CHO diet and to continue with increased physical activity as tolerated.  Continue to check BG 1 to 2 times daily  4.  Reminded patient that eye appt needed.  5.   Orders Placed This Encounter  Procedures  . Microalbumin / creatinine urine ratio  . POCT INR    Standing Status: Standing     Number of Occurrences: 52      Standing Expiration Date: 07/03/2015  . POCT glycosylated hemoglobin (Hb A1C)     TCherre Robins PharmD, CPP, CDE

## 2014-06-18 NOTE — Patient Instructions (Addendum)
Anticoagulation Dose Instructions as of 06/18/2014      [facility] [place] Tue Wed Thu Fri Sat   New Dose 7.5 mg 10 mg 7.5 mg 10 mg 10 mg 10 mg 7.5 mg    Description        Increase warfarin dose to 36m - take 2 tablets on Mondays, Wednesdays, Thursdays and Fridays.  Take 1 and 1/2 tablets all other days (Sundays, Tuesdays and Saturdays.     INR was 1.6 today   Diabetes and Standards of Medical Care   Diabetes is complicated. You may find that your diabetes team includes a dietitian, nurse, diabetes educator, eye doctor, and more. To help everyone know what is going on and to help you get the care you deserve, the following schedule of care was developed to help keep you on track. Below are the tests, exams, vaccines, medicines, education, and plans you will need.  Blood Glucose Goals Prior to meals = 80 - 130 Within 2 hours of the start of a meal = less than 180  HbA1c test (goal is less than 7.0% - your last value was 6.1%) This test shows how well you have controlled your glucose over the past 2 to 3 months. It is used to see if your diabetes management plan needs to be adjusted.   It is performed at least 2 times a year if you are meeting treatment goals.  It is performed 4 times a year if therapy has changed or if you are not meeting treatment goals.  Blood pressure test  This test is performed at every routine medical visit. The goal is less than 140/90 mmHg for most people, but 130/80 mmHg in some cases. Ask your health care provider about your goal.  Dental exam  Follow up with the dentist regularly.  Eye exam  If you are diagnosed with type 1 diabetes as a child, get an exam upon reaching the age of 161years or older and have had diabetes for 3 to 5 years. Yearly eye exams are recommended after that initial eye exam.  If you are diagnosed with type 1 diabetes as an adult, get an exam within 5 years of diagnosis and then yearly.  If you are diagnosed with type 2 diabetes, get an  exam as soon as possible after the diagnosis and then yearly.  Foot care exam  Visual foot exams are performed at every routine medical visit. The exams check for cuts, injuries, or other problems with the feet.  A comprehensive foot exam should be done yearly. This includes visual inspection as well as assessing foot pulses and testing for loss of sensation.  Check your feet nightly for cuts, injuries, or other problems with your feet. Tell your health care provider if anything is not healing.  Kidney function test (urine microalbumin)  This test is performed once a year.  Type 1 diabetes: The first test is performed 5 years after diagnosis.  Type 2 diabetes: The first test is performed at the time of diagnosis.  A serum creatinine and estimated glomerular filtration rate (eGFR) test is done once a year to assess the level of chronic kidney disease (CKD), if present.  Lipid profile (cholesterol, HDL, LDL, triglycerides)  Performed every 5 years for most people.  The goal for LDL is less than 100 mg/dL. If you are at high risk, the goal is less than 70 mg/dL.  The goal for HDL is 40 mg/dL to 50 mg/dL for men and 50 mg/dL  to 60 mg/dL for women. An HDL cholesterol of 60 mg/dL or higher gives some protection against heart disease.  The goal for triglycerides is less than 150 mg/dL.  Influenza vaccine, pneumococcal vaccine, and hepatitis B vaccine  The influenza vaccine is recommended yearly.  The pneumococcal vaccine is generally given once in a lifetime. However, there are some instances when another vaccination is recommended. Check with your health care provider.  The hepatitis B vaccine is also recommended for adults with diabetes.  Diabetes self-management education  Education is recommended at diagnosis and ongoing as needed.  Treatment plan  Your treatment plan is reviewed at every medical visit.  Document Released: 10/17/2008 Document Revised: 08/22/2012 Document  Reviewed: 05/22/2012 [facility] Patient Information 2014 [place].

## 2014-06-19 LAB — MICROALBUMIN / CREATININE URINE RATIO
Creatinine, Urine: 37.7 mg/dL
MICROALB/CREAT RATIO: 933.2 mg/g{creat} — AB (ref 0.0–30.0)
Microalbumin, Urine: 351.8 ug/mL

## 2014-06-26 ENCOUNTER — Telehealth: Payer: Self-pay | Admitting: Pharmacist

## 2014-06-26 NOTE — Telephone Encounter (Signed)
Urine microalbumin is still elevated by much improved compared to 11 months ago. He is not taking ACE or ARB because lisinopril was discontinued 04/2014 after hospitalization for cardiogenic shock and acute on chronic renal insufficiency.  Patient's BP and BG have been much improved over the last 3 months. I am not going to start an ACEor ARB at this time but will recheck urine microabumin in 3 months.  Patient aware

## 2014-07-09 ENCOUNTER — Ambulatory Visit (INDEPENDENT_AMBULATORY_CARE_PROVIDER_SITE_OTHER): Payer: BLUE CROSS/BLUE SHIELD | Admitting: Pharmacist

## 2014-07-09 DIAGNOSIS — I482 Chronic atrial fibrillation, unspecified: Secondary | ICD-10-CM

## 2014-07-09 LAB — POCT INR: INR: 1.7

## 2014-07-09 NOTE — Patient Instructions (Signed)
Anticoagulation Dose Instructions as of 07/09/2014      Dorene Grebe Tue Wed Thu Fri Sat   New Dose 10 mg 10 mg 7.5 mg 10 mg 10 mg 10 mg 7.5 mg    Description        Take 2 and 1/2 tablets today, then increase to 2 tablets daily except 1 and 1/2 tablet on tuesdays and saturdays.      INR was 1.7 today

## 2014-07-10 ENCOUNTER — Encounter (HOSPITAL_COMMUNITY): Payer: Self-pay

## 2014-07-10 ENCOUNTER — Ambulatory Visit (HOSPITAL_COMMUNITY)
Admission: RE | Admit: 2014-07-10 | Discharge: 2014-07-10 | Disposition: A | Discharge status: Home / Self Care | Payer: BLUE CROSS/BLUE SHIELD | Source: Ambulatory Visit | Attending: Cardiology | Admitting: Cardiology

## 2014-07-10 VITALS — BP 122/68 | HR 74 | Wt 257.0 lb

## 2014-07-10 DIAGNOSIS — E119 Type 2 diabetes mellitus without complications: Secondary | ICD-10-CM | POA: Diagnosis not present

## 2014-07-10 DIAGNOSIS — N178 Other acute kidney failure: Secondary | ICD-10-CM

## 2014-07-10 DIAGNOSIS — I5032 Chronic diastolic (congestive) heart failure: Secondary | ICD-10-CM | POA: Insufficient documentation

## 2014-07-10 DIAGNOSIS — Z79899 Other long term (current) drug therapy: Secondary | ICD-10-CM | POA: Diagnosis not present

## 2014-07-10 DIAGNOSIS — I482 Chronic atrial fibrillation, unspecified: Secondary | ICD-10-CM

## 2014-07-10 DIAGNOSIS — Z7901 Long term (current) use of anticoagulants: Secondary | ICD-10-CM | POA: Diagnosis not present

## 2014-07-10 DIAGNOSIS — J810 Acute pulmonary edema: Secondary | ICD-10-CM

## 2014-07-10 DIAGNOSIS — Z85038 Personal history of other malignant neoplasm of large intestine: Secondary | ICD-10-CM | POA: Diagnosis not present

## 2014-07-10 DIAGNOSIS — G4733 Obstructive sleep apnea (adult) (pediatric): Secondary | ICD-10-CM

## 2014-07-10 DIAGNOSIS — I509 Heart failure, unspecified: Secondary | ICD-10-CM | POA: Diagnosis not present

## 2014-07-10 DIAGNOSIS — I50810 Right heart failure, unspecified: Secondary | ICD-10-CM

## 2014-07-10 NOTE — Progress Notes (Signed)
Patient ID: [facility] Flores, male   DOB: [birth year], 64 y.o.   MRN: [id number] PCP: William. Laurance Flores  64 yo with history of chronic atrial fibrillation, diastolic CHF, and episodes of AKI presents for CHF clinic evaluation.  Patient was admitted in 2/16 with hypoxic respiratory failure with acute systolic CHF and possible PNA.  He was in atrial fibrillation with RVR and developed AKI.  Cardiomyopathy (EF 35-40% by echo this admission) thought to be tachycardia-mediated at the time.  He was admitted again in 4/16 with hypotension and AKI as well as multiple metabolic derangements.  He required CVVH.  Echo at this time showed EF 60-65%.  He was again admitted in 5/16, again with hypoxic respiratory failure and was intubated.  EF 60-65% by echo this admission.  He again had AKI and had HD x 1.  Renal function again recovered.   He presents to CHF clinic for followup today.  He has been chronically in atrial fibrillation.  Rate is controlled today.  He uses oxygen at night and sometimes during the day.  He has OSA but has not had CPAP for 3-4 years.  He is able to walk around [place] if he leans on a buggy.  He is short of breath walking up a flight of steps.  No orthopnea/PND.  No chest pain.  No melena/BRBPR.    ECG: atrial fibrillation with rate 78, septal Qs  Labs (6/16) with K 4.6, creatinine 0.97  PMH: 1. Chronic atrial fibrillation.  2. Cardiomyopathy: [place] 2008 with no significant CAD but EF 25%.  Echo (2/16) with EF 35-40%.  Echo (5/16) with EF 65-60%, mild MR, mild RV dilation with mildly decreased RV systolic function.  3. OSA: Not on CPAP.  4. Colon cancer s/p resection 5. Type II diabetes 6. Episodes of AKI.  SH: Lives in [place], nonsmoker for the last 30 years, no ETOH  FH: Father with MI  ROS: All systems reviewed and negative except as per HPI.   Current Outpatient Prescriptions  Medication Sig Dispense Refill  . allopurinol (ZYLOPRIM) 100 MG tablet Take 100 mg by mouth daily.  3  .  atorvastatin (LIPITOR) 20 MG tablet Take 1 tablet (20 mg total) by mouth daily at 6 PM. 30 tablet 6  . azelastine (OPTIVAR) 0.05 % ophthalmic solution Place 2 drops into both eyes 2 (two) times daily. (Patient taking differently: Place 2 drops into both eyes daily as needed. ) 6 mL 12  . diltiazem (CARDIZEM CD) 180 MG 24 hr capsule Take 1 capsule (180 mg total) by mouth daily. 30 capsule 3  . ferrous sulfate 325 (65 FE) MG tablet Take 1 tablet (325 mg total) by mouth 2 (two) times daily with a meal.  3  . furosemide (LASIX) 40 MG tablet Take 1 tablet (40 mg total) by mouth daily. 30 tablet 3  . glucose blood test strip Use to check BG daily 100 each 2  . KLOR-CON M20 20 MEQ tablet Take 10 mEq by mouth daily.  11  . metoprolol tartrate (LOPRESSOR) 25 MG tablet Take 1 tablet (25 mg total) by mouth 2 (two) times daily. 60 tablet 2  . omeprazole (PRILOSEC) 20 MG capsule TAKE 1 CAPSULE (20 MG TOTAL) BY MOUTH DAILY. 30 capsule 4  . polyvinyl alcohol (LIQUIFILM TEARS) 1.4 % ophthalmic solution Place 1 drop into both eyes as needed for dry eyes. 15 mL 0  . sitaGLIPtin-metformin (JANUMET) 50-1000 MG per tablet Take 1 tablet by mouth 2 (two) times daily  with a meal. Patient states he has been getting samples.    Marland Kitchen warfarin (COUMADIN) 10 MG tablet Take 1 tablet (10 mg total) by mouth daily at 6 PM. 30 tablet 0  . [DISCONTINUED] diltiazem (TIAZAC) 240 MG 24 hr capsule TAKE 1 CAPSULE DAILY 30 capsule 3   No current facility-administered medications for this encounter.   BP 122/68 mmHg  Pulse 74  Wt 257 lb (116.574 kg)  SpO2 95%  General: NAD, obese Neck: Thick, no JVD, no thyromegaly or thyroid nodule.  Lungs: Clear to auscultation bilaterally with normal respiratory effort. CV: Nondisplaced PMI.  Heart regular S1/S2, no S3/S4, no murmur.  No peripheral edema.  No carotid bruit.  Normal pedal pulses. Lower extremity venous varicosities.  Abdomen: Soft, nontender, no hepatosplenomegaly, no distention.   Skin: Intact without lesions or rashes.  Neurologic: Alert and oriented x 3.  Psych: Normal affect. Extremities: No clubbing or cyanosis.  HEENT: Normal.   Assessment/Plan: 1. Chronic diastolic CHF: Prior history of cardiomyopathy but EF was up to 60-65% on last echo. On exam today, he is not volume overloaded.  Would continue Lasix 40 mg daily.   2. Labile renal function with multiple episodes of AKI:  3 episodes of AKI this year, 2 requiring RRT.  Creatinine now back to 0.97.   - I will arrange for renal artery dopplers to look for renal artery stenosis.  - Will need to make sure that he has followup with nephrology.  3. OSA: Patient has OSA but has not been on CPAP.  RV is mildly dilated and dysfunction, this could be related to OSA.  I will refer to [place] for sleep evaluation.   4. Atrial fibrillation: Chronic.  Rate is controlled.  I think it would be reasonable to continue rate control strategy.  He is anticoagulated with warfarin.  Decision make not to change to DOAC given labile renal function.   [facility] Flores 62/07/2014

## 2014-07-10 NOTE — Patient Instructions (Signed)
Your physician has requested that you have a renal artery duplex. During this test, an ultrasound is used to evaluate blood flow to the kidneys. Allow one hour for this exam. Do not eat after midnight the day before and avoid carbonated beverages. Take your medications as you usually do.  You have been referred to Dr Fransico Him  We will contact you in 3 months to schedule your next appointment.

## 2014-07-17 ENCOUNTER — Other Ambulatory Visit: Payer: Self-pay

## 2014-07-17 MED ORDER — ALLOPURINOL 100 MG PO TABS: 100.0000 mg | ORAL_TABLET | Freq: Every day | ORAL | Status: DC

## 2014-07-18 ENCOUNTER — Ambulatory Visit (HOSPITAL_COMMUNITY): Payer: BLUE CROSS/BLUE SHIELD | Attending: Cardiology

## 2014-07-18 DIAGNOSIS — J810 Acute pulmonary edema: Secondary | ICD-10-CM

## 2014-07-18 DIAGNOSIS — E119 Type 2 diabetes mellitus without complications: Secondary | ICD-10-CM | POA: Diagnosis not present

## 2014-07-18 DIAGNOSIS — N178 Other acute kidney failure: Secondary | ICD-10-CM

## 2014-07-18 DIAGNOSIS — N289 Disorder of kidney and ureter, unspecified: Secondary | ICD-10-CM | POA: Diagnosis present

## 2014-07-19 ENCOUNTER — Other Ambulatory Visit: Payer: Self-pay | Admitting: Pharmacist

## 2014-07-23 ENCOUNTER — Ambulatory Visit (INDEPENDENT_AMBULATORY_CARE_PROVIDER_SITE_OTHER): Payer: BLUE CROSS/BLUE SHIELD | Admitting: Cardiology

## 2014-07-23 ENCOUNTER — Encounter: Payer: Self-pay | Admitting: Cardiology

## 2014-07-23 ENCOUNTER — Ambulatory Visit (INDEPENDENT_AMBULATORY_CARE_PROVIDER_SITE_OTHER): Payer: BLUE CROSS/BLUE SHIELD | Admitting: Pharmacist

## 2014-07-23 VITALS — BP 136/80 | HR 68 | Ht 64.0 in | Wt 263.0 lb

## 2014-07-23 DIAGNOSIS — I4891 Unspecified atrial fibrillation: Secondary | ICD-10-CM | POA: Diagnosis not present

## 2014-07-23 DIAGNOSIS — I482 Chronic atrial fibrillation, unspecified: Secondary | ICD-10-CM

## 2014-07-23 DIAGNOSIS — I420 Dilated cardiomyopathy: Secondary | ICD-10-CM | POA: Diagnosis not present

## 2014-07-23 LAB — POCT INR: INR: 1.9

## 2014-07-23 NOTE — Patient Instructions (Signed)
Anticoagulation Dose Instructions as of 07/23/2014      Dorene Grebe Tue Wed Thu Fri Sat   New Dose 10 mg 10 mg 7.5 mg 10 mg 10 mg 10 mg 7.5 mg    Description        Take 2 and 1/2 tablets today - Wednesday, July 20th then continue warfarinat current dose of 2 tablets daily except 1 and 1/2 tablet on tuesdays and saturdays.      INR was 1.9 today

## 2014-07-23 NOTE — Patient Instructions (Signed)
Medication Instructions:  Your physician recommends that you continue on your current medications as directed. Please refer to the Current Medication list given to you today.  Follow-Up: Follow up in 6 months with Dr. Percival Spanish.  You will receive a letter in the mail 2 months before you are due.  Please call us when you receive this letter to schedule your follow up appointment.  Thank you for choosing [facility]!!

## 2014-07-23 NOTE — Progress Notes (Signed)
Cardiology Office Note   Date:  07/23/2014   ID:  William Flores, DOB 1950/02/20, MRN [id number]  PCP:  Sharion Balloon, FNP  Cardiologist:   Minus Breeding, MD   No chief complaint on file.     History of Present Illness: [facility] Flores is a 64 y.o. male who presents for evaluation of atrial fibrillation and previous cardiomyopathy. As previously seen in [place] but wants to switch his care here. He had respiratory failure in February with combined acute systolic and diastolic heart failure and anemia 5-40% probably related to A. fib with rapid ventricular rate. In 2016 his atrial fibrillation rate was controlled. He did have volume overload and acute renal insufficiency and required CVVHD. His EF however have improved to normal. He was again admitted in mid May with respiratory failure due to noncompliance CPAP. He required dialysis. He was diureses and lost 29 pounds. Meds were adjusted to reduce his calcium channel blocker because of bradycardia.  Since I last saw him he has done well.  The patient denies any new symptoms such as chest discomfort, neck or arm discomfort. There has been no new shortness of breath, PND or orthopnea. There have been no reported palpitations, presyncope or syncope.  He reports he is compliant with his medications and he is trying to watch his salt. He is weighing himself daily and his weights are staying about to 60 area  Past Medical History  Diagnosis Date  . Diabetes   . Colon cancer   . Hypertension   . Sleep apnea   . Chronic atrial fibrillation   . Hyperlipidemia   . Chronic diastolic heart failure     HFPF - EF ~65-70% (OFTEN EXACERBATED BY AFIB)  . Colon polyps 09/21/2012    Tubular adenoma  . Obesity hypoventilation syndrome   . History of echocardiogram     Echo 05/16/14:  mild LVH, vigorous LVF, EF 65-70%, no RWMA, mean AV gradient 9 mmHg, MAC, mild MR, mod LAE, mild RVE, mild reduced RVF, severe RAE  . Cardiomyopathy     EF 35-40%  in 2/16 in setting of AF with RVR >> echo 5/16 with improved LVF with EF 65-70%  . CAD (coronary artery disease)     LHC 2/08:  mRCA 50%, o/w no sig CAD, EF 25%  . CKD (chronic kidney disease)     hx of worsening renal failure and hyperK+ in setting of acute diast CHF >> required CVVHD in 4/16 and dialysis x 1 in 5/16    Past Surgical History  Procedure Laterality Date  . Colon resection    . Circumcision    . Colonoscopy N/A 09/21/2012    Procedure: COLONOSCOPY;  Surgeon: Inda Castle, MD;  Location: [facility];  Service: Endoscopy;  Laterality: N/A;  . US echocardiography  03/04/2010    abnormal study     Current Outpatient Prescriptions  Medication Sig Dispense Refill  . allopurinol (ZYLOPRIM) 100 MG tablet Take 1 tablet (100 mg total) by mouth daily. 30 tablet 2  . atorvastatin (LIPITOR) 20 MG tablet Take 1 tablet (20 mg total) by mouth daily at 6 PM. 30 tablet 6  . azelastine (OPTIVAR) 0.05 % ophthalmic solution Place 2 drops into both eyes 2 (two) times daily. (Patient taking differently: Place 2 drops into both eyes daily as needed. ) 6 mL 12  . diltiazem (CARDIZEM CD) 180 MG 24 hr capsule Take 1 capsule (180 mg total) by mouth daily. 30 capsule 3  .  ferrous sulfate 325 (65 FE) MG tablet Take 1 tablet (325 mg total) by mouth 2 (two) times daily with a meal.  3  . furosemide (LASIX) 40 MG tablet Take 1 tablet (40 mg total) by mouth daily. 30 tablet 3  . KLOR-CON M20 20 MEQ tablet Take 10 mEq by mouth daily.  11  . metoprolol tartrate (LOPRESSOR) 25 MG tablet Take 1 tablet (25 mg total) by mouth 2 (two) times daily. 60 tablet 2  . omeprazole (PRILOSEC) 20 MG capsule TAKE 1 CAPSULE (20 MG TOTAL) BY MOUTH DAILY. 30 capsule 4  . polyvinyl alcohol (LIQUIFILM TEARS) 1.4 % ophthalmic solution Place 1 drop into both eyes as needed for dry eyes. 15 mL 0  . sitaGLIPtin-metformin (JANUMET) 50-1000 MG per tablet Take 1 tablet by mouth 2 (two) times daily with a meal. Patient states he has  been getting samples.    Marland Kitchen warfarin (COUMADIN) 5 MG tablet TAKE 1 TO 2 TABLETS DAILY AS DIRECTED BY ANTICOAGULATION CLINIC 60 tablet 2  . glucose blood test strip Use to check BG daily 100 each 2  . [DISCONTINUED] diltiazem (TIAZAC) 240 MG 24 hr capsule TAKE 1 CAPSULE DAILY 30 capsule 3   No current facility-administered medications for this visit.    Allergies:   Codeine    ROS:  Please see the history of present illness.   Otherwise, review of systems are positive for none.   All other systems are reviewed and negative.    PHYSICAL EXAM: VS:  BP 136/80 mmHg  Pulse 68  Ht 5' 4"  (1.626 m)  Wt 263 lb (119.296 kg)  BMI 45.12 kg/m2 , BMI Body mass index is 45.12 kg/(m^2). GENERAL:  Well appearing HEENT:  Pupils equal round and reactive, fundi not visualized, oral mucosa unremarkable NECK:  No jugular venous distention, waveform within normal limits, carotid upstroke brisk and symmetric, no bruits, no thyromegaly LYMPHATICS:  No cervical, inguinal adenopathy LUNGS:  Clear to auscultation bilaterally BACK:  No CVA tenderness CHEST:  Unremarkable HEART:  PMI not displaced or sustained,S1 and S2 within normal limits, no T7,SV clicks, no rubs, no murmurs, irregular ABD:  Flat, positive bowel sounds normal in frequency in pitch, no bruits, no rebound, no guarding, no midline pulsatile mass, no hepatomegaly, no splenomegaly, umbilical hernia. EXT:  2 plus pulses throughout, trace lower extremity edema, no cyanosis no clubbing, chronic venous stasis changes SKIN:  No rashes no nodules NEURO:  Cranial nerves II through XII grossly intact, motor grossly intact throughout PSYCH:  Cognitively intact, oriented to person place and time    EKG:  EKG is not ordered today.   Recent Labs: 10/28/2013: Pro B Natriuretic peptide (BNP) 325.7* 04/14/2014: TSH 3.662 05/17/2014: B Natriuretic Peptide 320.6* 05/20/2014: Platelets 136* 05/21/2014: Magnesium 1.8 06/04/2014: ALT 9; BUN 23; Creatinine, Ser  0.97; Hemoglobin 10.4*; Potassium 4.6; Sodium 143    Lipid Panel    Component Value Date/Time   CHOL 150 02/11/2014 0750   TRIG 130 02/19/2014 0011   TRIG 177* 07/18/2013 1547   HDL 20* 02/11/2014 0750   HDL 24* 07/18/2013 1547   CHOLHDL 7.5 02/11/2014 0750   VLDL 23 02/11/2014 0750   LDLCALC 107* 02/11/2014 0750   LDLCALC 67 07/18/2013 1547      Wt Readings from Last 3 Encounters:  07/23/14 263 lb (119.296 kg)  07/10/14 257 lb (116.574 kg)  06/18/14 254 lb (115.214 kg)      Other studies Reviewed: Additional studies/ records that were reviewed today include:  Echo results from April and May. Review of the above records demonstrates:  Please see elsewhere in the note.     ASSESSMENT AND PLAN:   Chronic diastolic CHF (congestive heart failure): He seems to be euvolemic.  We discussed salt and fluid restriction and dietary changes. We discussed when necessary dosing of his diuretic. No change in therapy is indicated.  Chronic atrial fibrillation:  Tolerates rate control and anticoagulation. No change in therapy is indicated.  OSA (obstructive sleep apnea):  He is to be getting a CPAP.    Essential hypertension:  The blood pressure is at target. No change in medications is indicated. We will continue with therapeutic lifestyle changes (TLC).  Morbid obesity: We discussed the importance of weight Loss.   CKD (chronic kidney disease), unspecified stage: I reviewed his recent labs in his basic metabolic profile has returned to baseline. No change in therapy is indicated.   Current medicines are reviewed at length with the patient today.  The patient does not have concerns regarding medicines.  The following changes have been made:  no change  Labs/ tests ordered today include: None  No orders of the defined types were placed in this encounter.     Disposition:   FU with me in six weeks.      Signed, Minus Breeding, MD  07/23/2014 2:57 PM    [facility]

## 2014-07-28 ENCOUNTER — Other Ambulatory Visit: Payer: Self-pay | Admitting: *Deleted

## 2014-07-28 MED ORDER — OMEPRAZOLE 20 MG PO CPDR: DELAYED_RELEASE_CAPSULE | ORAL | Status: DC

## 2014-08-24 ENCOUNTER — Other Ambulatory Visit: Payer: Self-pay | Admitting: Internal Medicine

## 2014-08-25 ENCOUNTER — Telehealth: Payer: Self-pay | Admitting: Pharmacist

## 2014-08-25 ENCOUNTER — Ambulatory Visit (INDEPENDENT_AMBULATORY_CARE_PROVIDER_SITE_OTHER): Payer: BLUE CROSS/BLUE SHIELD | Admitting: Pharmacist

## 2014-08-25 ENCOUNTER — Encounter (INDEPENDENT_AMBULATORY_CARE_PROVIDER_SITE_OTHER): Payer: Self-pay

## 2014-08-25 DIAGNOSIS — I4891 Unspecified atrial fibrillation: Secondary | ICD-10-CM | POA: Diagnosis not present

## 2014-08-25 LAB — POCT INR: INR: 2.7

## 2014-08-25 NOTE — Patient Instructions (Signed)
Anticoagulation Dose Instructions as of 08/25/2014      [place] Tue Wed Thu Fri Sat   New Dose 10 mg 10 mg 7.5 mg 10 mg 10 mg 10 mg 7.5 mg    Description        Continue warfarin at current dose of 2 tablets daily except 1 and 1/2 tablet on tuesdays and saturdays.      INR was 2.7 today

## 2014-08-26 ENCOUNTER — Encounter: Payer: Self-pay | Admitting: Cardiology

## 2014-08-26 ENCOUNTER — Ambulatory Visit (INDEPENDENT_AMBULATORY_CARE_PROVIDER_SITE_OTHER): Payer: BLUE CROSS/BLUE SHIELD | Admitting: Cardiology

## 2014-08-26 VITALS — BP 118/58 | HR 80 | Ht 64.0 in | Wt 275.4 lb

## 2014-08-26 DIAGNOSIS — I10 Essential (primary) hypertension: Secondary | ICD-10-CM | POA: Diagnosis not present

## 2014-08-26 DIAGNOSIS — G4733 Obstructive sleep apnea (adult) (pediatric): Secondary | ICD-10-CM

## 2014-08-26 NOTE — Progress Notes (Signed)
Cardiology Office Note   Date:  08/26/2014   ID:  William Flores, DOB October 06, 1950, MRN [id number]  PCP:  William Balloon, FNP    Chief Complaint  Patient presents with  . New Evaluation    OSA      History of Present Illness: William Flores is a 64 y.o. male who presents for evaluation of OSA.  He was diagnosed with sleep apnea about 8 years ago and has not used his CPAP in over 2 years.  He had some problems getting a mask so he stopped using it.  He tolerated the CPAP when he was on it.  He was using a full face mask before.  He thinks that he snores.  He occasionally feels sleepy in the am.  He occasionally will nap during the day when watching TV.  He walks in [place] for exercise.      Past Medical History  Diagnosis Date  . Diabetes   . Colon cancer   . Hypertension   . Sleep apnea   . Chronic atrial fibrillation   . Hyperlipidemia   . Chronic diastolic heart failure     HFPF - EF ~65-70% (OFTEN EXACERBATED BY AFIB)  . Colon polyps 09/21/2012    Tubular adenoma  . Obesity hypoventilation syndrome   . History of echocardiogram     Echo 05/16/14:  mild LVH, vigorous LVF, EF 65-70%, no RWMA, mean AV gradient 9 mmHg, MAC, mild MR, mod LAE, mild RVE, mild reduced RVF, severe RAE  . Cardiomyopathy     EF 35-40% in 2/16 in setting of AF with RVR >> echo 5/16 with improved LVF with EF 65-70%  . CAD (coronary artery disease)     LHC 2/08:  mRCA 50%, o/w no sig CAD, EF 25%  . CKD (chronic kidney disease)     hx of worsening renal failure and hyperK+ in setting of acute diast CHF >> required CVVHD in 4/16 and dialysis x 1 in 5/16    Past Surgical History  Procedure Laterality Date  . Colon resection    . Circumcision    . Colonoscopy N/A 09/21/2012    Procedure: COLONOSCOPY;  Surgeon: William Castle, MD;  Location: [facility];  Service: Endoscopy;  Laterality: N/A;  . US echocardiography  03/04/2010    abnormal study     Current Outpatient  Prescriptions  Medication Sig Dispense Refill  . allopurinol (ZYLOPRIM) 100 MG tablet Take 1 tablet (100 mg total) by mouth daily. 30 tablet 2  . atorvastatin (LIPITOR) 20 MG tablet Take 1 tablet (20 mg total) by mouth daily at 6 PM. 30 tablet 6  . azelastine (OPTIVAR) 0.05 % ophthalmic solution Place 2 drops into both eyes 2 (two) times daily.    Marland Kitchen diltiazem (CARDIZEM CD) 180 MG 24 hr capsule Take 1 capsule (180 mg total) by mouth daily. 30 capsule 3  . ferrous sulfate 325 (65 FE) MG EC tablet Take 325 mg by mouth 2 (two) times daily.    . furosemide (LASIX) 40 MG tablet Take 1 tablet (40 mg total) by mouth daily. 30 tablet 3  . furosemide (LASIX) 80 MG tablet Take 80 mg by mouth. Take 49m qam and 452min the afternoon as needed  1  . furosemide (LASIX) 80 MG tablet Take 80 mg by mouth daily. Pt takes 80 mg in the AM and 40 mg at  PM as needed    . glucose blood test strip Use to check BG daily 100 each 2  . metoprolol tartrate (LOPRESSOR) 25 MG tablet Take 1 tablet (25 mg total) by mouth 2 (two) times daily. 60 tablet 2  . omeprazole (PRILOSEC) 20 MG capsule TAKE 1 CAPSULE (20 MG TOTAL) BY MOUTH DAILY. 30 capsule 5  . polyvinyl alcohol (LIQUIFILM TEARS) 1.4 % ophthalmic solution Place 1 drop into both eyes as needed for dry eyes. 15 mL 0  . potassium chloride SA (K-DUR,KLOR-CON) 20 MEQ tablet Take 20 mEq by mouth.    . sitaGLIPtin-metformin (JANUMET) 50-1000 MG per tablet Take 1 tablet by mouth 2 (two) times daily with a meal. Patient states he has been getting samples.    Marland Kitchen warfarin (COUMADIN) 5 MG tablet TAKE 1 TO 2 TABLETS DAILY AS DIRECTED BY ANTICOAGULATION CLINIC 60 tablet 2  . [DISCONTINUED] diltiazem (TIAZAC) 240 MG 24 hr capsule TAKE 1 CAPSULE DAILY 30 capsule 3   No current facility-administered medications for this visit.    Allergies:   Codeine    Social History:  The patient  reports that he quit smoking about 31 years ago. He has never used smokeless tobacco. He reports that  he does not drink alcohol or use illicit drugs.   Family History:  The patient's family history includes Breast cancer in his mother; Diabetes in his mother; Heart attack in his father.    ROS:  Please see the history of present illness.   Otherwise, review of systems are positive for none.   All other systems are reviewed and negative.    PHYSICAL EXAM: VS:  BP 118/58 mmHg  Pulse 80  Ht 5' 4"  (1.626 m)  Wt 275 lb 6.4 oz (124.921 kg)  BMI 47.25 kg/m2  SpO2 94% , BMI Body mass index is 47.25 kg/(m^2). GEN: Well nourished, well developed, in no acute distress HEENT: normal Neck: no JVD, carotid bruits, or masses Cardiac: RRR; no murmurs, rubs, or gallops,no edema  Respiratory:  clear to auscultation bilaterally, normal work of breathing GI: soft, nontender, nondistended, + BS MS: no deformity or atrophy Skin: warm and dry, no rash Neuro:  Strength and sensation are intact Psych: euthymic mood, full affect   EKG:  EKG is not ordered today.    Recent Labs: 10/28/2013: Pro B Natriuretic peptide (BNP) 325.7* 04/14/2014: TSH 3.662 05/17/2014: B Natriuretic Peptide 320.6* 05/20/2014: Platelets 136* 05/21/2014: Magnesium 1.8 06/04/2014: ALT 9; BUN 23; Creatinine, Ser 0.97; Hemoglobin 10.4*; Potassium 4.6; Sodium 143    Lipid Panel    Component Value Date/Time   CHOL 150 02/11/2014 0750   TRIG 130 02/19/2014 0011   TRIG 177* 07/18/2013 1547   HDL 20* 02/11/2014 0750   HDL 24* 07/18/2013 1547   CHOLHDL 7.5 02/11/2014 0750   VLDL 23 02/11/2014 0750   LDLCALC 107* 02/11/2014 0750   LDLCALC 67 07/18/2013 1547      Wt Readings from Last 3 Encounters:  08/26/14 275 lb 6.4 oz (124.921 kg)  07/23/14 263 lb (119.296 kg)  07/10/14 257 lb (116.574 kg)        ASSESSMENT AND PLAN:  1.  OSA on CPAP in the past but has not been on it in 2 year.  He does snore and naps some during the day.  I will set him up for a split night CPAP study. 2.  HTN - controlled on current medical  regimen 3.  Obesity - I encouraged him to try to be  more compliant with diet and exercise   Current medicines are reviewed at length with the patient today.  The patient does not have concerns regarding medicines.  The following changes have been made:  no change  Labs/ tests ordered today: See above Assessment and Plan No orders of the defined types were placed in this encounter.     Disposition:   FU with me PRN pending results of studies  SignedSueanne Margarita, MD  08/26/2014 11:21 AM    [facility] [place], [place], [place]  [address] Phone: [phone]; Fax: [phone]

## 2014-08-26 NOTE — Patient Instructions (Signed)
Medication Instructions:  Your physician recommends that you continue on your current medications as directed. Please refer to the Current Medication list given to you today.   Labwork: None  Testing/Procedures: Your physician has recommended that you have a sleep study. This test records several body functions during sleep, including: brain activity, eye movement, oxygen and carbon dioxide blood levels, heart rate and rhythm, breathing rate and rhythm, the flow of air through your mouth and nose, snoring, body muscle movements, and chest and belly movement.  Follow-Up: Your physician recommends that you schedule a follow-up appointment AS NEEDED with Dr. Radford Pax pending study results.  Any Other Special Instructions Will Be Listed Below (If Applicable).

## 2014-08-27 NOTE — Addendum Note (Signed)
Addended by: Harland German A on: 08/27/2014 02:25 PM   Modules accepted: Orders

## 2014-08-28 NOTE — Telephone Encounter (Signed)
Notified pt, form is at front

## 2014-08-29 ENCOUNTER — Other Ambulatory Visit: Payer: Self-pay | Admitting: Pharmacist

## 2014-09-02 ENCOUNTER — Other Ambulatory Visit: Payer: Self-pay | Admitting: Family

## 2014-09-09 ENCOUNTER — Other Ambulatory Visit: Payer: Self-pay | Admitting: Family

## 2014-09-09 DIAGNOSIS — R0602 Shortness of breath: Secondary | ICD-10-CM

## 2014-09-11 ENCOUNTER — Encounter (HOSPITAL_COMMUNITY): Payer: BLUE CROSS/BLUE SHIELD

## 2014-09-15 ENCOUNTER — Other Ambulatory Visit: Payer: Self-pay | Admitting: Family

## 2014-09-23 ENCOUNTER — Encounter: Payer: Self-pay | Admitting: Pharmacist

## 2014-09-25 ENCOUNTER — Ambulatory Visit (INDEPENDENT_AMBULATORY_CARE_PROVIDER_SITE_OTHER): Payer: BLUE CROSS/BLUE SHIELD | Admitting: Family Medicine

## 2014-09-25 ENCOUNTER — Encounter: Payer: Self-pay | Admitting: Family Medicine

## 2014-09-25 VITALS — BP 154/85 | HR 64 | Temp 98.2°F | Ht 64.0 in | Wt 282.2 lb

## 2014-09-25 DIAGNOSIS — E1129 Type 2 diabetes mellitus with other diabetic kidney complication: Secondary | ICD-10-CM

## 2014-09-25 DIAGNOSIS — I482 Chronic atrial fibrillation, unspecified: Secondary | ICD-10-CM

## 2014-09-25 DIAGNOSIS — I4891 Unspecified atrial fibrillation: Secondary | ICD-10-CM

## 2014-09-25 DIAGNOSIS — D500 Iron deficiency anemia secondary to blood loss (chronic): Secondary | ICD-10-CM

## 2014-09-25 DIAGNOSIS — E1165 Type 2 diabetes mellitus with hyperglycemia: Secondary | ICD-10-CM | POA: Diagnosis not present

## 2014-09-25 DIAGNOSIS — G4733 Obstructive sleep apnea (adult) (pediatric): Secondary | ICD-10-CM | POA: Diagnosis not present

## 2014-09-25 DIAGNOSIS — IMO0002 Reserved for concepts with insufficient information to code with codable children: Secondary | ICD-10-CM

## 2014-09-25 LAB — POCT GLYCOSYLATED HEMOGLOBIN (HGB A1C): HEMOGLOBIN A1C: 6.4

## 2014-09-25 LAB — POCT INR: INR: 2.9

## 2014-09-25 MED ORDER — BENZONATATE 100 MG PO CAPS: 100.0000 mg | ORAL_CAPSULE | Freq: Two times a day (BID) | ORAL | Status: DC | PRN

## 2014-09-25 NOTE — Patient Instructions (Signed)
Great to see you!  Your A1C is 6.4, that's an average blood sugar of 137, great job!  Keep an eye on your weight like the heart doctors have discussed with you.   If you notice that you are not urinating vigorously after the fluid pill, let one of [place] know.

## 2014-09-25 NOTE — Progress Notes (Signed)
   HPI  Patient presents today here for follow-up of chronic diseases  Diabetes Average blood sugar fasting at home is 140-160, postprandial 30 minutes to 2 hours after dinner is average of 180-200. He exercises by walking 2-4 laps around [employer] 3 or 4 times a week.  Heart failure He has good diuresis with current Lasix dose, he can repeat his weight gain goals easily He has had slow weight gain over the last 4-5 weeks and is closely followed by cardiology. His good med compliance He has stable 2 pillow orthopnea.  A. fib No tachycardia, tolerating warfarin and metoprolol easy.  OSA Very much appreciates his new sleep apnea and mask, states he is breathing easier, has a resolution of previous chest tightness.  Cough States he's had cough, runny nose, and dry throat for the last 1-2 weeks. His grandchildren have similar illnesses. He denies any malaise, fever, subjective fever, chills, or dyspnea that is unusual.  No chest pain or chest tightness  PMH: Smoking status noted ROS: Per HPI  Objective: BP 154/85 mmHg  Pulse 64  Temp(Src) 98.2 F (36.8 C) (Oral)  Ht 5' 4"  (1.626 m)  Wt 282 lb 3.2 oz (128.005 kg)  BMI 48.42 kg/m2 Gen: NAD, alert, cooperative with exam HEENT: NCAT, oropharynx clear, TMs [facility] BL, nares clear CV: RRR, good S1/S2, no murmur Resp: CTABL, no wheezes, non-labored Ext: 1-2+ pitting edema bilaterally, hemosiderin staining bilateral lower extremities Neuro: Alert and oriented, No gross deficits  Assessment and plan:  # Diabetes Controlled, encouraged in congratulated Continue Januvia  # Obstructive sleep apnea Appreciate cardiology's management, patient is doing well with new CPAP  # CHF Previously has had EF as low as 25% during acute exacerbation, EF now recovered to 65% with medical management Continue current dose of Lasix, 80 a.m., 40 p.m. Caution with weight gain, discussed at length -has stable orthopnea and no crackles on exam today  #  A. fib INR 2.9 today, no Coumadin dose changes Rate controlled, continue metoprolol and Cardizem   Common cold Cough and rhinorrhea likely due to common cold with sick contacts, no malaise, increased dyspnea, or angina lung exam to concern me for pneumonia Given Tessalon Perles for his request, discussed red flags for return at length   Orders Placed This Encounter  Procedures  . CBC  . CMP14+EGFR  . Ferritin  . Iron  . POCT glycosylated hemoglobin (Hb A1C)    Laroy Apple, MD [place] 09/25/2014, 9:25 AM

## 2014-09-26 LAB — CBC
HEMATOCRIT: 38.9 % (ref 37.5–51.0)
Hemoglobin: 11.7 g/dL — ABNORMAL LOW (ref 12.6–17.7)
MCH: 26.3 pg — ABNORMAL LOW (ref 26.6–33.0)
MCHC: 30.1 g/dL — AB (ref 31.5–35.7)
MCV: 87 fL (ref 79–97)
PLATELETS: 189 10*3/uL (ref 150–379)
RBC: 4.45 x10E6/uL (ref 4.14–5.80)
RDW: 19.4 % — AB (ref 12.3–15.4)
WBC: 6.1 10*3/uL (ref 3.4–10.8)

## 2014-09-26 LAB — CMP14+EGFR
A/G RATIO: 1.4 (ref 1.1–2.5)
ALK PHOS: 70 IU/L (ref 39–117)
ALT: 16 IU/L (ref 0–44)
AST: 19 IU/L (ref 0–40)
Albumin: 4 g/dL (ref 3.6–4.8)
BUN/Creatinine Ratio: 27 — ABNORMAL HIGH (ref 10–22)
BUN: 39 mg/dL — ABNORMAL HIGH (ref 8–27)
Bilirubin Total: 0.5 mg/dL (ref 0.0–1.2)
CHLORIDE: 100 mmol/L (ref 97–108)
CO2: 22 mmol/L (ref 18–29)
Calcium: 8.9 mg/dL (ref 8.6–10.2)
Creatinine, Ser: 1.43 mg/dL — ABNORMAL HIGH (ref 0.76–1.27)
GFR calc Af Amer: 60 mL/min/{1.73_m2} (ref >59)
GFR calc non Af Amer: 52 mL/min/{1.73_m2} — ABNORMAL LOW (ref >59)
GLOBULIN, TOTAL: 2.8 g/dL (ref 1.5–4.5)
Glucose: 203 mg/dL — ABNORMAL HIGH (ref 65–99)
Potassium: 4.5 mmol/L (ref 3.5–5.2)
Sodium: 141 mmol/L (ref 134–144)
Total Protein: 6.8 g/dL (ref 6.0–8.5)

## 2014-09-26 LAB — FERRITIN: FERRITIN: 59 ng/mL (ref 30–400)

## 2014-09-26 LAB — IRON: Iron: 31 ug/dL — ABNORMAL LOW (ref 38–169)

## 2014-09-29 ENCOUNTER — Telehealth: Payer: Self-pay | Admitting: Family

## 2014-09-29 NOTE — Telephone Encounter (Signed)
Patient aware of results.

## 2014-10-11 ENCOUNTER — Other Ambulatory Visit: Payer: Self-pay | Admitting: Family

## 2014-10-21 ENCOUNTER — Other Ambulatory Visit: Payer: Self-pay | Admitting: *Deleted

## 2014-10-21 ENCOUNTER — Other Ambulatory Visit: Payer: Self-pay | Admitting: Family

## 2014-10-21 MED ORDER — BENZONATATE 100 MG PO CAPS: 100.0000 mg | ORAL_CAPSULE | Freq: Two times a day (BID) | ORAL | Status: DC | PRN

## 2014-10-27 ENCOUNTER — Ambulatory Visit (INDEPENDENT_AMBULATORY_CARE_PROVIDER_SITE_OTHER): Payer: BLUE CROSS/BLUE SHIELD | Admitting: Pharmacist

## 2014-10-27 DIAGNOSIS — Z23 Encounter for immunization: Secondary | ICD-10-CM

## 2014-10-27 DIAGNOSIS — I4891 Unspecified atrial fibrillation: Secondary | ICD-10-CM | POA: Diagnosis not present

## 2014-10-27 LAB — POCT INR: INR: 2.6

## 2014-10-27 NOTE — Patient Instructions (Signed)
Anticoagulation Dose Instructions as of 10/27/2014      Dorene Grebe Tue Wed Thu Fri Sat   New Dose 10 mg 10 mg 7.5 mg 10 mg 10 mg 10 mg 7.5 mg    Description        Continue warfarin at current dose of 2 tablets daily except 1 and 1/2 tablet on tuesdays and saturdays.      INR was 2.6 today

## 2014-10-27 NOTE — Progress Notes (Signed)
Reminded patient to make appt for eye appt - he states that he does not have extra funds right not but he is planning to make appt soon.  Patient received both influenza and pneumonia (Pneumovax 23) vaccines in office today

## 2014-11-04 ENCOUNTER — Encounter: Payer: Self-pay | Admitting: Family Medicine

## 2014-11-04 ENCOUNTER — Ambulatory Visit (INDEPENDENT_AMBULATORY_CARE_PROVIDER_SITE_OTHER): Payer: BLUE CROSS/BLUE SHIELD | Admitting: Family Medicine

## 2014-11-04 VITALS — BP 155/91 | HR 75 | Temp 97.2°F | Ht 64.0 in | Wt 283.4 lb

## 2014-11-04 DIAGNOSIS — E1122 Type 2 diabetes mellitus with diabetic chronic kidney disease: Secondary | ICD-10-CM | POA: Diagnosis not present

## 2014-11-04 DIAGNOSIS — I10 Essential (primary) hypertension: Secondary | ICD-10-CM | POA: Diagnosis not present

## 2014-11-04 DIAGNOSIS — Z0000 Encounter for general adult medical examination without abnormal findings: Secondary | ICD-10-CM

## 2014-11-04 DIAGNOSIS — N183 Chronic kidney disease, stage 3 (moderate): Secondary | ICD-10-CM

## 2014-11-04 DIAGNOSIS — E1165 Type 2 diabetes mellitus with hyperglycemia: Secondary | ICD-10-CM

## 2014-11-04 DIAGNOSIS — D649 Anemia, unspecified: Secondary | ICD-10-CM | POA: Insufficient documentation

## 2014-11-04 DIAGNOSIS — IMO0002 Reserved for concepts with insufficient information to code with codable children: Secondary | ICD-10-CM

## 2014-11-04 NOTE — Progress Notes (Signed)
   HPI  Patient presents today for follow-up of hypertension and anemia.  Hypertension Does not check blood pressure at home No chest pain, new dyspnea, leg edema, or palpitations. He does have some stable exertional dyspnea as well as leg swelling that has improved from previous states.  Anemia [employer] normal No signs of bleeding Taking iron twice daily with no constipation  Diabetes Needs an eye doctor referral  PMH: Smoking status noted ROS: Per HPI  Objective: BP 155/91 mmHg  Pulse 75  Temp(Src) 97.2 F (36.2 C) (Oral)  Ht 5' 4"  (1.626 m)  Wt 283 lb 6.4 oz (128.549 kg)  BMI 48.62 kg/m2 Gen: NAD, alert, cooperative with exam HEENT: NCAT CV: RRR, good S1/S2, no murmur Resp: CTABL, no wheezes, non-labored - no crackles Abd: SNTND, BS present, no guarding or organomegaly Ext: 1+ edema BL, hemosiderin  staining bilaterally Neuro: Alert and oriented, No gross deficits  Assessment and plan:  # HTN Needs to have something additional added, Ace woould be ideal with DM2, CHF Danne Baxter he has had acute renal failure on lisinopril before so I will recheck his labs with recent renal stress before I start. Could consider Bi Dil instead of this would not help his microalbuminuria  # Diabetes Refer to ophthalmology, next A1c GU in December  # Anemia  normocytic anemia, likely of chronic disease Mild range, repeat CBC   Orders Placed This Encounter  Procedures  . BMP8+EGFR  . CBC  . Ambulatory referral to Ophthalmology    Referral Priority:  Routine    Referral Type:  Consultation    Referral Reason:  Specialty Services Required    Requested Specialty:  Ophthalmology    Number of Visits Requested:  1    No orders of the defined types were placed in this encounter.    Laroy Apple, MD [facility] 11/04/2014, 9:46 AM

## 2014-11-04 NOTE — Patient Instructions (Addendum)
Great to see you!  We will call with your results, and plan to start another blood pressure medicine after them.  Come back in 1 month

## 2014-11-05 LAB — CBC
HEMOGLOBIN: 13.9 g/dL (ref 12.6–17.7)
Hematocrit: 44.6 % (ref 37.5–51.0)
MCH: 26.7 pg (ref 26.6–33.0)
MCHC: 31.2 g/dL — AB (ref 31.5–35.7)
MCV: 86 fL (ref 79–97)
Platelets: 171 10*3/uL (ref 150–379)
RBC: 5.2 x10E6/uL (ref 4.14–5.80)
RDW: 19.7 % — ABNORMAL HIGH (ref 12.3–15.4)
WBC: 6.6 10*3/uL (ref 3.4–10.8)

## 2014-11-05 LAB — BMP8+EGFR
BUN/Creatinine Ratio: 24 — ABNORMAL HIGH (ref 10–22)
BUN: 30 mg/dL — AB (ref 8–27)
CHLORIDE: 101 mmol/L (ref 97–106)
CO2: 23 mmol/L (ref 18–29)
Calcium: 9.6 mg/dL (ref 8.6–10.2)
Creatinine, Ser: 1.27 mg/dL (ref 0.76–1.27)
GFR calc Af Amer: 70 mL/min/{1.73_m2} (ref >59)
GFR calc non Af Amer: 60 mL/min/{1.73_m2} (ref >59)
GLUCOSE: 131 mg/dL — AB (ref 65–99)
POTASSIUM: 4.7 mmol/L (ref 3.5–5.2)
Sodium: 141 mmol/L (ref 136–144)

## 2014-11-11 ENCOUNTER — Encounter (HOSPITAL_BASED_OUTPATIENT_CLINIC_OR_DEPARTMENT_OTHER): Payer: BLUE CROSS/BLUE SHIELD

## 2014-11-13 LAB — HM DIABETES EYE EXAM

## 2014-11-27 ENCOUNTER — Other Ambulatory Visit: Payer: Self-pay | Admitting: Family

## 2014-12-04 ENCOUNTER — Encounter: Payer: Self-pay | Admitting: Pharmacist

## 2014-12-04 ENCOUNTER — Ambulatory Visit (INDEPENDENT_AMBULATORY_CARE_PROVIDER_SITE_OTHER): Payer: BLUE CROSS/BLUE SHIELD | Admitting: Pharmacist

## 2014-12-04 ENCOUNTER — Other Ambulatory Visit: Payer: Self-pay | Admitting: *Deleted

## 2014-12-04 VITALS — BP 148/82 | HR 80

## 2014-12-04 DIAGNOSIS — I4891 Unspecified atrial fibrillation: Secondary | ICD-10-CM | POA: Diagnosis not present

## 2014-12-04 DIAGNOSIS — I482 Chronic atrial fibrillation, unspecified: Secondary | ICD-10-CM

## 2014-12-04 DIAGNOSIS — I10 Essential (primary) hypertension: Secondary | ICD-10-CM | POA: Diagnosis not present

## 2014-12-04 LAB — POCT INR: INR: 3.3

## 2014-12-04 MED ORDER — METOPROLOL TARTRATE 50 MG PO TABS: 50.0000 mg | ORAL_TABLET | Freq: Two times a day (BID) | ORAL | Status: DC

## 2014-12-04 MED ORDER — WARFARIN SODIUM 5 MG PO TABS: ORAL_TABLET | ORAL | Status: DC

## 2014-12-04 NOTE — Patient Instructions (Signed)
Anticoagulation Dose Instructions as of 12/04/2014      William Flores Tue Wed Thu Fri Sat   New Dose 10 mg 10 mg 7.5 mg 10 mg 10 mg 10 mg 7.5 mg    Description        No warfarin today - Thursday, December 1st.  Then continue warfarin at current dose of 2 tablets daily except 1 and 1/2 tablet on tuesdays and saturdays.      INR was 3.3 today   Continue to use saline nasal spray for dry nasal passages.   Use Afrin (or generic) - spray for nasal congestion - do not use greater than 3 days in a row  Antihistamine for runny nose / sinus drainage - Claritin / loratadine or zyrtec / cetirizine

## 2014-12-04 NOTE — Progress Notes (Signed)
Subjective:     Indication: atrial fibrillation Bleeding signs/symptoms: None Thromboembolic signs/symptoms: None  Missed Coumadin doses: None Medication changes: no Dietary changes: no Bacterial/viral infection: cold symptoms reported today - sore throat, sinus drainage, sneezing Other concerns: yes - BP was elevated at last visit and this visit.  Patient has DM but is not taking ACEI or ARB because of history of ARI 2- times this year.   Current BP meds are: furosemide 35m qam and additional 410mprn.  Metoprolol 2596mid, diltiazem 180m79m.   The following portions of the patient's history were reviewed and updated as appropriate: allergies, current medications, past family history, past medical history, past social history, past surgical history and problem list.  Objective:    INR Today: 3.3 Current dose: warfarin 5mg 45m and 1/2 tablets on tuesdays and saturdays and 2 tablets all other days.      Filed Vitals:   12/04/14 1133  BP: 148/82  Pulse: 80     Assessment:    Supratherapeutic INR for goal of 2-3   HTN  Plan:    1. New dose: no warfarin today then restart current dose of  warfarin 5mg -74mand 1/2 tablets on tuesdays and saturdays and 2 tablets all other days.      2.  Increase metoprolol to 50mg t49m1 tablet bid  3.  Try nasal spray decongestant (no more than 3 days) and OTC antihistamine such as cliaritin or zyrtec.  If not improvement in 3 days or if worsens then call office for appt.  4.  Next INR: 1 month    Jonanthony Nahar ECherre RobinsD, CPP

## 2014-12-05 ENCOUNTER — Other Ambulatory Visit: Payer: Self-pay | Admitting: Cardiovascular Disease

## 2014-12-05 NOTE — Telephone Encounter (Signed)
Rx request sent to pharmacy.  

## 2014-12-10 ENCOUNTER — Encounter: Payer: Self-pay | Admitting: Family

## 2014-12-12 ENCOUNTER — Ambulatory Visit (INDEPENDENT_AMBULATORY_CARE_PROVIDER_SITE_OTHER): Payer: BLUE CROSS/BLUE SHIELD | Admitting: Family Medicine

## 2014-12-12 ENCOUNTER — Other Ambulatory Visit: Payer: Self-pay | Admitting: Family

## 2014-12-12 ENCOUNTER — Encounter: Payer: Self-pay | Admitting: Family Medicine

## 2014-12-12 VITALS — BP 140/80 | HR 81 | Temp 98.4°F | Ht 64.0 in | Wt 286.6 lb

## 2014-12-12 DIAGNOSIS — E1122 Type 2 diabetes mellitus with diabetic chronic kidney disease: Secondary | ICD-10-CM

## 2014-12-12 DIAGNOSIS — E1165 Type 2 diabetes mellitus with hyperglycemia: Secondary | ICD-10-CM

## 2014-12-12 DIAGNOSIS — G4733 Obstructive sleep apnea (adult) (pediatric): Secondary | ICD-10-CM

## 2014-12-12 DIAGNOSIS — I50810 Right heart failure, unspecified: Secondary | ICD-10-CM

## 2014-12-12 DIAGNOSIS — IMO0002 Reserved for concepts with insufficient information to code with codable children: Secondary | ICD-10-CM

## 2014-12-12 DIAGNOSIS — N183 Chronic kidney disease, stage 3 (moderate): Secondary | ICD-10-CM

## 2014-12-12 DIAGNOSIS — I509 Heart failure, unspecified: Secondary | ICD-10-CM

## 2014-12-12 DIAGNOSIS — R040 Epistaxis: Secondary | ICD-10-CM

## 2014-12-12 MED ORDER — HYDROCODONE-HOMATROPINE 5-1.5 MG/5ML PO SYRP: 5.0000 mL | ORAL_SOLUTION | Freq: Three times a day (TID) | ORAL | Status: DC | PRN

## 2014-12-12 NOTE — Progress Notes (Signed)
Subjective:    Patient ID: William Flores, male    DOB: 02-May-1950, 64 y.o.   MRN: [id number]  HPI 64 year old gentleman with nosebleed. He's been wearing CPAP about 6 weeks and has noticed this problem more recently. He has oxygen hooked up to the CPAP not humidification. He is not on specific about is the bleeding anterior posterior but as near as I can tail it's both.  Patient Active Problem List   Diagnosis Date Noted  . Normocytic anemia 11/04/2014  . Healthcare maintenance 11/04/2014  . Peripheral edema   . Essential hypertension   . pauses 05/20/2014  . Acute respiratory failure ([place]) 05/16/2014  . Acute exacerbation of CHF (congestive heart failure) ([place])   . Acute on chronic respiratory failure with hypoxemia ([place])   . Acute renal failure syndrome ([place])   . Hypertension 04/20/2014  . DM (diabetes mellitus), type 2, uncontrolled, with renal complications ([place]) 43/15/4008  . Dysphagia 04/20/2014  . Atrial fibrillation with slow ventricular response ([place]) 04/14/2014  . Bradycardia 04/14/2014  . Hyperkalemia 04/14/2014  . AKI (acute kidney injury) ([place]) 04/14/2014  . Acute-on-chronic renal failure ([place])   . Anticoagulated on Coumadin 03/20/2014  . Nonischemic cardiomyopathy ([place]) 02/20/2014  . OSA (obstructive sleep apnea)   . Right heart failure ([place]) 02/10/2014  . Chronic atrial fibrillation ([place])   . Acute respiratory failure with hypoxia ([place]) 10/28/2013  . Morbid obesity ([place]) 08/05/2013  . Anemia 08/05/2013  . Hyperlipidemia   . History of colon cancer   . Gout 05/25/2012  . Hypertensive heart disease    Outpatient Encounter Prescriptions as of 12/12/2014  Medication Sig  . allopurinol (ZYLOPRIM) 100 MG tablet TAKE 1 TABLET (100 MG TOTAL) BY MOUTH DAILY.  Marland Kitchen atorvastatin (LIPITOR) 20 MG tablet TAKE 1 TABLET (20 MG TOTAL) BY MOUTH DAILY AT 6 PM.  . azelastine (OPTIVAR) 0.05 % ophthalmic solution Place 2 drops into both eyes 2 (two) times daily.  . benzonatate  (TESSALON) 100 MG capsule Take 1 capsule (100 mg total) by mouth 2 (two) times daily as needed for cough.  . diltiazem (CARDIZEM CD) 180 MG 24 hr capsule TAKE 1 CAPSULE EVERY DAY  . ferrous sulfate 325 (65 FE) MG EC tablet Take 325 mg by mouth 2 (two) times daily.  . furosemide (LASIX) 40 MG tablet Take 1 tablet (40 mg total) by mouth daily.  . furosemide (LASIX) 80 MG tablet Take 80 mg by mouth daily. Pt takes 80 mg in the AM and 40 mg at PM as needed  . glucose blood test strip Use to check BG daily  . metoprolol (LOPRESSOR) 50 MG tablet Take 1 tablet (50 mg total) by mouth 2 (two) times daily.  Marland Kitchen omeprazole (PRILOSEC) 20 MG capsule TAKE 1 CAPSULE (20 MG TOTAL) BY MOUTH DAILY.  Marland Kitchen polyvinyl alcohol (LIQUIFILM TEARS) 1.4 % ophthalmic solution Place 1 drop into both eyes as needed for dry eyes.  . potassium chloride SA (K-DUR,KLOR-CON) 20 MEQ tablet Take 20 mEq by mouth.  . sitaGLIPtin-metformin (JANUMET) 50-1000 MG per tablet Take 1 tablet by mouth 2 (two) times daily with a meal. Patient states he has been getting samples.  Marland Kitchen warfarin (COUMADIN) 5 MG tablet TAKE 1 TO 2 TABLETS DAILY AS DIRECTED BY ANTICOAGULATION CLINIC   No facility-administered encounter medications on file as of 12/12/2014.      Review of Systems  Constitutional: Negative.   HENT: Positive for nosebleeds.   Respiratory: Negative.   Cardiovascular: Negative.   Neurological:  Negative.        Objective:   Physical Exam  Constitutional: He appears well-developed and well-nourished.  HENT:  Inspection of both nares shows no active bleeding nor scabs. Given this finding, I would assume the bleeding point is posterior.          Assessment & Plan:  1. OSA (obstructive sleep apnea) Leave the epistaxis is at least in part related to the lack of humidification with CPAP. I have asked this patient to call his respiratory therapy company and get the humidifier added.  2. Right heart failure ([place]) Compensated at this  time  3. Uncontrolled type 2 diabetes mellitus with stage 3 chronic kidney disease, without long-term current use of insulin (HCC) A1c was at goal  4. Epistaxis ". I think the epistaxis is related to rapid airflow and dry air. I was not aware he was on Coumadin which might be related but there is no other source of bleeding that he has mentioned  Wardell Honour MD

## 2014-12-24 ENCOUNTER — Other Ambulatory Visit: Payer: Self-pay | Admitting: *Deleted

## 2014-12-24 MED ORDER — ALLOPURINOL 100 MG PO TABS: ORAL_TABLET | ORAL | Status: DC

## 2014-12-26 ENCOUNTER — Other Ambulatory Visit: Payer: Self-pay | Admitting: Family Medicine

## 2014-12-30 NOTE — Telephone Encounter (Signed)
Last seen 12/12/14 Dr Sabra Heck

## 2015-01-06 ENCOUNTER — Encounter: Payer: Self-pay | Admitting: Family Medicine

## 2015-01-06 ENCOUNTER — Ambulatory Visit (INDEPENDENT_AMBULATORY_CARE_PROVIDER_SITE_OTHER): Payer: BLUE CROSS/BLUE SHIELD | Admitting: Family Medicine

## 2015-01-06 VITALS — BP 126/79 | HR 70 | Temp 98.6°F | Ht 64.0 in | Wt 286.2 lb

## 2015-01-06 DIAGNOSIS — I4891 Unspecified atrial fibrillation: Secondary | ICD-10-CM | POA: Diagnosis not present

## 2015-01-06 DIAGNOSIS — R809 Proteinuria, unspecified: Secondary | ICD-10-CM

## 2015-01-06 DIAGNOSIS — E118 Type 2 diabetes mellitus with unspecified complications: Secondary | ICD-10-CM

## 2015-01-06 DIAGNOSIS — E1165 Type 2 diabetes mellitus with hyperglycemia: Secondary | ICD-10-CM | POA: Diagnosis not present

## 2015-01-06 DIAGNOSIS — I482 Chronic atrial fibrillation, unspecified: Secondary | ICD-10-CM

## 2015-01-06 DIAGNOSIS — L609 Nail disorder, unspecified: Secondary | ICD-10-CM | POA: Diagnosis not present

## 2015-01-06 DIAGNOSIS — IMO0002 Reserved for concepts with insufficient information to code with codable children: Secondary | ICD-10-CM

## 2015-01-06 DIAGNOSIS — E1129 Type 2 diabetes mellitus with other diabetic kidney complication: Secondary | ICD-10-CM

## 2015-01-06 DIAGNOSIS — L602 Onychogryphosis: Secondary | ICD-10-CM | POA: Insufficient documentation

## 2015-01-06 LAB — POCT GLYCOSYLATED HEMOGLOBIN (HGB A1C): Hemoglobin A1C: 7.7

## 2015-01-06 LAB — POCT INR: INR: 5.8

## 2015-01-06 MED ORDER — POLYMYXIN B-TRIMETHOPRIM 10000-0.1 UNIT/ML-% OP SOLN: 2.0000 [drp] | Freq: Four times a day (QID) | OPHTHALMIC | Status: DC

## 2015-01-06 NOTE — Patient Instructions (Addendum)
Great to see you!  We will call with your A1C results in the next day or two.   Please talk to Tammy about your Coumadin dosing. We will need you to hold it for a few days and come back for a check sooner rather than later  Use the eye drops 4 times daily for 1 week.   Bacterial Conjunctivitis Bacterial conjunctivitis (commonly called pink eye) is redness, soreness, or puffiness (inflammation) of the white part of your eye. It is caused by a germ called bacteria. These germs can easily spread from person to person (contagious). Your eye often will become red or pink. Your eye may also become irritated, watery, or have a thick discharge.  HOME CARE   Apply a cool, clean washcloth over closed eyelids. Do this for 10-20 minutes, 3-4 times a day while you have pain.  Gently wipe away any fluid coming from the eye with a warm, wet washcloth or cotton ball.  Wash your hands often with soap and water. Use paper towels to dry your hands.  Do not share towels or washcloths.  Change or wash your pillowcase every day.  Do not use eye makeup until the infection is gone.  Do not use machines or drive if your vision is blurry.  Stop using contact lenses. Do not use them again until your doctor says it is okay.  Do not touch the tip of the eye drop bottle or medicine tube with your fingers when you put medicine on the eye. GET HELP RIGHT AWAY IF:   Your eye is not better after 3 days of starting your medicine.  You have a yellowish fluid coming out of the eye.  You have more pain in the eye.  Your eye redness is spreading.  Your vision becomes blurry.  You have a fever or lasting symptoms for more than 2-3 days.  You have a fever and your symptoms suddenly get worse.  You have pain in the face.  Your face gets red or puffy (swollen). MAKE SURE YOU:   Understand these instructions.  Will watch this condition.  Will get help right away if you are not doing well or get worse.    This information is not intended to replace advice given to you by your health care provider. Make sure you discuss any questions you have with your health care provider.   Document Released: 09/29/2007 Document Revised: 12/07/2011 Document Reviewed: 08/26/2011 Elsevier Interactive Patient Education 2016 [place].    Anticoagulation Dose Instructions as of 01/06/2015      William Flores Tue Wed Thu Fri Sat   New Dose 7.5 mg 10 mg 7.5 mg 10 mg 7.5 mg 10 mg 7.5 mg    Description        No warfarin for the next 3 days, decrease warfarin dose to 2 tablets on Mondays, Wednesdays and Fridays and 1 and 1/2 tablets all other days.       INR was 5.8 today

## 2015-01-06 NOTE — Progress Notes (Signed)
   HPI  Patient presents today discussed diabetes, red itchy eyes, and a pro-time check.  Diabetes Good medication compliance No hypoglycemia Average fasting blood sugars 120-140  Red itchy eyes Describes about one week of red itchy eyes, some swelling, clear drainage from bilateral eyes Also some scabbing. Denies any difficulty with vision and states he is seeing normally currently. He has had improvement in the symptoms after using his eyedrops for dry eyes.   PMH: Smoking status noted ROS: Per HPI  Objective: BP 126/79 mmHg  Pulse 70  Temp(Src) 98.6 F (37 C) (Oral)  Ht 5' 4"  (1.626 m)  Wt 286 lb 3.2 oz (129.819 kg)  BMI 49.10 kg/m2 Gen: NAD, alert, cooperative with exam HEENT: NCAT, bilateral eye tearing with injected conjunctivae bilaterally, some heme crusting in the left eye in small spots approximately 1 mm in diameter and circular in shape, approximately 3-5 total CV: RRR, good S1/S2, no murmur Resp: CTABL, no wheezes, non-labore Neuro: Alert and oriented, No gross deficits  Diabetic foot exam 2+ dorsalis pedis pulses, sensation intact to monofilament throughout Thick callus on bilateral heels with scaling skin Very thickened, elongated, and misshapen toenails on all 10 toes  Assessment and plan:  # Diabetes Uncontrolled Recent eye exam, will request records - [facility] in [place] exam normal except for misshapen toenails and calluses Diabetic control slipping, will recommend more aggressive diet controlled considering that this was worsened throughout the holiday season Continue current meds- will follow-up by phone when urine microalbumin this returns Currently not on ACE inhibitor as this was discontinued when he had acute renal failure in a previous hospitalization, if restarted will restart very cautiously  # A. fib, chronic anticoagulation, supratherapeutic INR INR 5.8 today Agree with Tammy Eckard, PharmD's plan- Hold 3 days and decrease dose per  the AVS Follow-up INR in 1 week  # Pink eye Treating with Polytrim drops Discussed low threshold for seeking medical attention if vision is affected in any way Symptoms seem to be improving overall I believe the heme crusting is probably representative of excoriation from constant rubbing    Orders Placed This Encounter  Procedures  . Microalbumin/Creatinine Ratio, Urine  . Ambulatory referral to Podiatry    Referral Priority:  Routine    Referral Type:  Consultation    Referral Reason:  Specialty Services Required    Requested Specialty:  Podiatry    Number of Visits Requested:  1  . POCT glycosylated hemoglobin (Hb A1C)    Meds ordered this encounter  Medications  . trimethoprim-polymyxin b (POLYTRIM) ophthalmic solution    Sig: Place 2 drops into both eyes every 6 (six) hours.    Dispense:  10 mL    Refill:  0    Laroy Apple, MD [facility] 01/06/2015, 10:22 AM

## 2015-01-07 LAB — MICROALBUMIN / CREATININE URINE RATIO
CREATININE, UR: 58.2 mg/dL
MICROALB/CREAT RATIO: 2546 mg/g creat — ABNORMAL HIGH (ref 0.0–30.0)
MICROALBUM., U, RANDOM: 1481.8 ug/mL

## 2015-01-09 NOTE — Addendum Note (Signed)
Addended by: Thana Ates on: 01/09/2015 07:58 AM   Modules accepted: Orders

## 2015-01-14 ENCOUNTER — Ambulatory Visit (INDEPENDENT_AMBULATORY_CARE_PROVIDER_SITE_OTHER): Payer: BLUE CROSS/BLUE SHIELD | Admitting: Pharmacist

## 2015-01-14 DIAGNOSIS — I482 Chronic atrial fibrillation, unspecified: Secondary | ICD-10-CM

## 2015-01-14 LAB — POCT INR: INR: 2.7

## 2015-01-14 NOTE — Patient Instructions (Signed)
Anticoagulation Dose Instructions as of 01/14/2015      William Flores Tue Wed Thu Fri Sat   New Dose 7.5 mg 10 mg 7.5 mg 10 mg 7.5 mg 10 mg 7.5 mg    Description        Continue current warfarin 1m dose - take 2 tablets on Mondays, Wednesdays and Fridays and 1 and 1/2 tablets all other days.       INR was 2.7 today

## 2015-01-30 ENCOUNTER — Other Ambulatory Visit: Payer: Self-pay | Admitting: Family

## 2015-02-02 ENCOUNTER — Encounter: Payer: Self-pay | Admitting: Pharmacist

## 2015-02-03 ENCOUNTER — Encounter: Payer: Self-pay | Admitting: Family Medicine

## 2015-02-11 ENCOUNTER — Ambulatory Visit (INDEPENDENT_AMBULATORY_CARE_PROVIDER_SITE_OTHER): Payer: BLUE CROSS/BLUE SHIELD | Admitting: Pharmacist

## 2015-02-11 ENCOUNTER — Other Ambulatory Visit: Payer: BLUE CROSS/BLUE SHIELD

## 2015-02-11 DIAGNOSIS — I482 Chronic atrial fibrillation, unspecified: Secondary | ICD-10-CM

## 2015-02-11 DIAGNOSIS — I4891 Unspecified atrial fibrillation: Secondary | ICD-10-CM

## 2015-02-11 DIAGNOSIS — I10 Essential (primary) hypertension: Secondary | ICD-10-CM

## 2015-02-11 LAB — POCT INR: INR: 5.1

## 2015-02-11 NOTE — Patient Instructions (Signed)
Anticoagulation Dose Instructions as of 02/11/2015      Dorene Grebe Tue Wed Thu Fri Sat   New Dose 7.5 mg 7.5 mg 7.5 mg Hold Hold 7.5 mg 7.5 mg   Alt Week 7.5 mg 7.5 mg 7.5 mg 7.5 mg 7.5 mg 7.5 mg 7.5 mg    Description        No warfarin today or tomorrow.  Then decrease dose to warfarin 80m - take 1 and 1/2 tablets daily.         INR was 5.1 today (too thin)

## 2015-02-11 NOTE — Progress Notes (Signed)
Lab only 

## 2015-02-12 ENCOUNTER — Encounter: Payer: Self-pay | Admitting: Pharmacist

## 2015-02-12 LAB — RENAL FUNCTION PANEL
ALBUMIN: 3.7 g/dL (ref 3.6–4.8)
BUN/Creatinine Ratio: 23 — ABNORMAL HIGH (ref 10–22)
BUN: 33 mg/dL — AB (ref 8–27)
CO2: 26 mmol/L (ref 18–29)
CREATININE: 1.45 mg/dL — AB (ref 0.76–1.27)
Calcium: 9.1 mg/dL (ref 8.6–10.2)
Chloride: 97 mmol/L (ref 96–106)
GFR calc Af Amer: 59 mL/min/{1.73_m2} — ABNORMAL LOW (ref >59)
GFR, EST NON AFRICAN AMERICAN: 51 mL/min/{1.73_m2} — AB (ref >59)
GLUCOSE: 95 mg/dL (ref 65–99)
PHOSPHORUS: 3.1 mg/dL (ref 2.5–4.5)
POTASSIUM: 4.8 mmol/L (ref 3.5–5.2)
Sodium: 144 mmol/L (ref 134–144)

## 2015-02-16 ENCOUNTER — Ambulatory Visit (INDEPENDENT_AMBULATORY_CARE_PROVIDER_SITE_OTHER): Payer: BLUE CROSS/BLUE SHIELD | Admitting: Pharmacist

## 2015-02-16 DIAGNOSIS — I482 Chronic atrial fibrillation, unspecified: Secondary | ICD-10-CM

## 2015-02-16 DIAGNOSIS — I4891 Unspecified atrial fibrillation: Secondary | ICD-10-CM | POA: Diagnosis not present

## 2015-02-16 LAB — POCT INR: INR: 2.3

## 2015-02-16 NOTE — Patient Instructions (Signed)
Anticoagulation Dose Instructions as of 02/16/2015      William Flores Tue Wed Thu Fri Sat   New Dose 7.5 mg 7.5 mg 7.5 mg 7.5 mg 7.5 mg 7.5 mg 7.5 mg    Description        Continue current dose of warfarin 77m - take 1 and 1/2 tablets daily.         INR was 2.3 today

## 2015-02-23 ENCOUNTER — Ambulatory Visit (INDEPENDENT_AMBULATORY_CARE_PROVIDER_SITE_OTHER): Payer: BLUE CROSS/BLUE SHIELD | Admitting: Family Medicine

## 2015-02-23 ENCOUNTER — Other Ambulatory Visit (HOSPITAL_COMMUNITY): Payer: Self-pay | Admitting: Nephrology

## 2015-02-23 ENCOUNTER — Encounter: Payer: Self-pay | Admitting: Family Medicine

## 2015-02-23 VITALS — BP 137/72 | HR 73 | Temp 97.4°F | Ht 64.0 in | Wt 287.4 lb

## 2015-02-23 DIAGNOSIS — R188 Other ascites: Secondary | ICD-10-CM

## 2015-02-23 DIAGNOSIS — E1165 Type 2 diabetes mellitus with hyperglycemia: Secondary | ICD-10-CM

## 2015-02-23 DIAGNOSIS — E1122 Type 2 diabetes mellitus with diabetic chronic kidney disease: Secondary | ICD-10-CM

## 2015-02-23 DIAGNOSIS — R609 Edema, unspecified: Secondary | ICD-10-CM

## 2015-02-23 DIAGNOSIS — N183 Chronic kidney disease, stage 3 unspecified: Secondary | ICD-10-CM

## 2015-02-23 DIAGNOSIS — IMO0002 Reserved for concepts with insufficient information to code with codable children: Secondary | ICD-10-CM

## 2015-02-23 DIAGNOSIS — L609 Nail disorder, unspecified: Secondary | ICD-10-CM

## 2015-02-23 DIAGNOSIS — L602 Onychogryphosis: Secondary | ICD-10-CM

## 2015-02-23 NOTE — Progress Notes (Signed)
   HPI  Patient presents today for routine follow-up.  Patient explains that about 3 weeks ago he had extremely increased abdominal swelling, he contacted his nephrologist who increased his Lasix dose. He states that today he feels much better, is breathing better, his abdominal discomfort has improved. He has good diuresis and takes 80 mg of Lasix twice daily.  Average fasting blood sugar 130-140, as high as 170 and occasional.  He is seen podiatry since our last visit, he is happy with the results of his toenails. No foot problems. No numbness or tingling in the feet.  His nephrologists also prescribed him compression stockings which he still needs to get.  PMH: Smoking status noted ROS: Per HPI  Objective: BP 137/72 mmHg  Pulse 73  Temp(Src) 97.4 F (36.3 C) (Oral)  Ht 5' 4"  (1.626 m)  Wt 287 lb 6.4 oz (130.364 kg)  BMI 49.31 kg/m2 Gen: NAD, alert, cooperative with exam HEENT: NCAT CV: RRR, good S1/S2, no murmur Resp: Soft crackle at the bilateral bases, nonlabored Abd: Distended Ext: No edema, warm Neuro: Alert and oriented, No gross deficits  Diabetic foot exam 2+ dorsalis pedis pulses Thickened overgrown toenails No lesions Sensation intact to monofilament throughout  Assessment and plan:  # T2 diabetes A1c elevated to 7.7 on his last visit His fasting blood sugars sound reasonably controlled Continue Janumet, follow-up after next A1c is due in about 6 weeks  # Peripheral edema, abdominal swelling Appreciate nephrology's management, he is improving greatly. Encouraged him to get his compression stockings.  # Hypertension Doing well on Lasix and metoprolol No changes Discussed Lasix dosing at 8:30 AM and 4 PM to avoid nighttime diuresis  Right Heart failure Appears euvolemic, his lungs have very soft crackles in the bases that is breathing is nonlabored. Continue aggressive diuresis, likely nearing his dry weight  Thickened toenails- improved after  podiatry treatment  Laroy Apple, MD [facility] 02/23/2015, 3:19 PM

## 2015-02-23 NOTE — Patient Instructions (Signed)
Great to see you!  Lets plan to see you back in April  I am glad your swelling is getting better!

## 2015-03-03 ENCOUNTER — Ambulatory Visit (HOSPITAL_COMMUNITY)
Admission: RE | Admit: 2015-03-03 | Discharge: 2015-03-03 | Disposition: A | Discharge status: Home / Self Care | Payer: BLUE CROSS/BLUE SHIELD | Source: Ambulatory Visit | Attending: Nephrology | Admitting: Nephrology

## 2015-03-03 DIAGNOSIS — N183 Chronic kidney disease, stage 3 unspecified: Secondary | ICD-10-CM

## 2015-03-03 DIAGNOSIS — R188 Other ascites: Secondary | ICD-10-CM | POA: Diagnosis present

## 2015-03-19 ENCOUNTER — Other Ambulatory Visit: Payer: BLUE CROSS/BLUE SHIELD

## 2015-03-23 ENCOUNTER — Ambulatory Visit (INDEPENDENT_AMBULATORY_CARE_PROVIDER_SITE_OTHER): Payer: BLUE CROSS/BLUE SHIELD | Admitting: Pharmacist

## 2015-03-23 DIAGNOSIS — I4891 Unspecified atrial fibrillation: Secondary | ICD-10-CM | POA: Diagnosis not present

## 2015-03-23 LAB — COAGUCHEK XS/INR WAIVED
INR: 2.1 — ABNORMAL HIGH (ref 0.9–1.1)
PROTHROMBIN TIME: 25.3 s

## 2015-03-23 NOTE — Patient Instructions (Signed)
Anticoagulation Dose Instructions as of 03/23/2015      William Flores Tue Wed Thu Fri Sat   New Dose 7.5 mg 7.5 mg 7.5 mg 7.5 mg 7.5 mg 7.5 mg 7.5 mg    Description        Continue current dose of warfarin 60m - take 1 and 1/2 tablets daily.         INR was 2.1 today

## 2015-03-24 ENCOUNTER — Telehealth: Payer: Self-pay

## 2015-03-24 MED ORDER — WARFARIN SODIUM 5 MG PO TABS: ORAL_TABLET | ORAL | Status: DC

## 2015-03-24 NOTE — Telephone Encounter (Signed)
med

## 2015-03-26 ENCOUNTER — Other Ambulatory Visit (HOSPITAL_COMMUNITY): Payer: Self-pay | Admitting: Nephrology

## 2015-03-26 DIAGNOSIS — N183 Chronic kidney disease, stage 3 unspecified: Secondary | ICD-10-CM

## 2015-03-31 ENCOUNTER — Other Ambulatory Visit: Payer: Self-pay | Admitting: Family

## 2015-03-31 ENCOUNTER — Encounter (HOSPITAL_COMMUNITY): Payer: BLUE CROSS/BLUE SHIELD

## 2015-03-31 ENCOUNTER — Encounter (HOSPITAL_COMMUNITY): Payer: Self-pay | Admitting: Hematology & Oncology

## 2015-03-31 ENCOUNTER — Telehealth: Payer: Self-pay | Admitting: Family

## 2015-03-31 ENCOUNTER — Encounter (HOSPITAL_COMMUNITY): Payer: BLUE CROSS/BLUE SHIELD | Attending: Hematology & Oncology | Admitting: Hematology & Oncology

## 2015-03-31 ENCOUNTER — Ambulatory Visit (HOSPITAL_COMMUNITY)
Admission: RE | Admit: 2015-03-31 | Discharge: 2015-03-31 | Disposition: A | Discharge status: Home / Self Care | Payer: BLUE CROSS/BLUE SHIELD | Source: Ambulatory Visit | Attending: Hematology & Oncology | Admitting: Hematology & Oncology

## 2015-03-31 VITALS — BP 97/54 | HR 62 | Temp 98.0°F | Resp 20 | Ht 64.0 in | Wt 271.1 lb

## 2015-03-31 DIAGNOSIS — N189 Chronic kidney disease, unspecified: Secondary | ICD-10-CM

## 2015-03-31 DIAGNOSIS — I482 Chronic atrial fibrillation: Secondary | ICD-10-CM | POA: Insufficient documentation

## 2015-03-31 DIAGNOSIS — E662 Morbid (severe) obesity with alveolar hypoventilation: Secondary | ICD-10-CM | POA: Insufficient documentation

## 2015-03-31 DIAGNOSIS — Z9889 Other specified postprocedural states: Secondary | ICD-10-CM | POA: Insufficient documentation

## 2015-03-31 DIAGNOSIS — Z87891 Personal history of nicotine dependence: Secondary | ICD-10-CM | POA: Diagnosis not present

## 2015-03-31 DIAGNOSIS — R809 Proteinuria, unspecified: Secondary | ICD-10-CM | POA: Diagnosis not present

## 2015-03-31 DIAGNOSIS — Z79899 Other long term (current) drug therapy: Secondary | ICD-10-CM | POA: Insufficient documentation

## 2015-03-31 DIAGNOSIS — E785 Hyperlipidemia, unspecified: Secondary | ICD-10-CM | POA: Insufficient documentation

## 2015-03-31 DIAGNOSIS — I878 Other specified disorders of veins: Secondary | ICD-10-CM | POA: Diagnosis not present

## 2015-03-31 DIAGNOSIS — N183 Chronic kidney disease, stage 3 (moderate): Secondary | ICD-10-CM | POA: Insufficient documentation

## 2015-03-31 DIAGNOSIS — Z85038 Personal history of other malignant neoplasm of large intestine: Secondary | ICD-10-CM | POA: Diagnosis not present

## 2015-03-31 DIAGNOSIS — D890 Polyclonal hypergammaglobulinemia: Secondary | ICD-10-CM | POA: Diagnosis not present

## 2015-03-31 DIAGNOSIS — I5032 Chronic diastolic (congestive) heart failure: Secondary | ICD-10-CM | POA: Diagnosis not present

## 2015-03-31 DIAGNOSIS — Z86010 Personal history of colonic polyps: Secondary | ICD-10-CM | POA: Diagnosis not present

## 2015-03-31 DIAGNOSIS — Z7984 Long term (current) use of oral hypoglycemic drugs: Secondary | ICD-10-CM | POA: Insufficient documentation

## 2015-03-31 DIAGNOSIS — E1122 Type 2 diabetes mellitus with diabetic chronic kidney disease: Secondary | ICD-10-CM | POA: Insufficient documentation

## 2015-03-31 DIAGNOSIS — I517 Cardiomegaly: Secondary | ICD-10-CM | POA: Insufficient documentation

## 2015-03-31 DIAGNOSIS — I129 Hypertensive chronic kidney disease with stage 1 through stage 4 chronic kidney disease, or unspecified chronic kidney disease: Secondary | ICD-10-CM | POA: Diagnosis present

## 2015-03-31 DIAGNOSIS — I2510 Atherosclerotic heart disease of native coronary artery without angina pectoris: Secondary | ICD-10-CM | POA: Diagnosis not present

## 2015-03-31 DIAGNOSIS — D472 Monoclonal gammopathy: Secondary | ICD-10-CM

## 2015-03-31 LAB — CBC WITH DIFFERENTIAL/PLATELET
BASOS ABS: 0 10*3/uL (ref 0.0–0.1)
BASOS PCT: 1 %
EOS ABS: 0.1 10*3/uL (ref 0.0–0.7)
Eosinophils Relative: 2 %
HEMATOCRIT: 45 % (ref 39.0–52.0)
Hemoglobin: 14.7 g/dL (ref 13.0–17.0)
LYMPHS ABS: 1.1 10*3/uL (ref 0.7–4.0)
LYMPHS PCT: 24 %
MCH: 28.9 pg (ref 26.0–34.0)
MCHC: 32.7 g/dL (ref 30.0–36.0)
MCV: 88.6 fL (ref 78.0–100.0)
Monocytes Absolute: 0.4 10*3/uL (ref 0.1–1.0)
Monocytes Relative: 10 %
NEUTROS ABS: 2.8 10*3/uL (ref 1.7–7.7)
Neutrophils Relative %: 63 %
PLATELETS: 117 10*3/uL — AB (ref 150–400)
RBC: 5.08 MIL/uL (ref 4.22–5.81)
RDW: 17.7 % — AB (ref 11.5–15.5)
WBC: 4.4 10*3/uL (ref 4.0–10.5)

## 2015-03-31 LAB — COMPREHENSIVE METABOLIC PANEL
ALT: 23 U/L (ref 17–63)
AST: 37 U/L (ref 15–41)
Albumin: 3.8 g/dL (ref 3.5–5.0)
Alkaline Phosphatase: 55 U/L (ref 38–126)
Anion gap: 11 (ref 5–15)
BILIRUBIN TOTAL: 0.7 mg/dL (ref 0.3–1.2)
BUN: 54 mg/dL — AB (ref 6–20)
CALCIUM: 8.6 mg/dL — AB (ref 8.9–10.3)
CO2: 24 mmol/L (ref 22–32)
CREATININE: 2.56 mg/dL — AB (ref 0.61–1.24)
Chloride: 102 mmol/L (ref 101–111)
GFR, EST AFRICAN AMERICAN: 29 mL/min — AB (ref >60)
GFR, EST NON AFRICAN AMERICAN: 25 mL/min — AB (ref >60)
Glucose, Bld: 110 mg/dL — ABNORMAL HIGH (ref 65–99)
Potassium: 4.2 mmol/L (ref 3.5–5.1)
Sodium: 137 mmol/L (ref 135–145)
TOTAL PROTEIN: 7.9 g/dL (ref 6.5–8.1)

## 2015-03-31 MED ORDER — FUROSEMIDE 80 MG PO TABS: ORAL_TABLET | ORAL | Status: DC

## 2015-03-31 NOTE — Patient Instructions (Addendum)
[place] at [facility] Discharge Instructions  RECOMMENDATIONS MADE BY THE CONSULTANT AND ANY TEST RESULTS WILL BE SENT TO YOUR REFERRING PHYSICIAN.   Exam and discussion by Dr Whitney Muse today M-protein spike is common as we get older.  Occasionally we have to do a bone marrow biopsy, this m-protein is made in the bone marrow  Lab work today. Bone survey (this is a x-ray that looks at all your bones) Return to see the doctor in 3-4 weeks  Please call the clinic if you have any questions or concerns     Thank you for choosing [place] at [facility] to provide your oncology and hematology care.  To afford each patient quality time with our provider, please arrive at least 15 minutes before your scheduled appointment time.   Beginning January 23rd 2017 lab work for the [name] will be done in the  Main lab at [facility] on 1st floor. If you have a lab appointment with the [facility] please come in thru the  Main Entrance and check in at the main information desk  You need to re-schedule your appointment should you arrive 10 or more minutes late.  We strive to give you quality time with our providers, and arriving late affects you and other patients whose appointments are after yours.  Also, if you no show three or more times for appointments you may be dismissed from the clinic at the providers discretion.     Again, thank you for choosing [facility].  Our hope is that these requests will decrease the amount of time that you wait before being seen by our physicians.       _____________________________________________________________  Should you have questions after your visit to [facility], please contact our office at [phone] between the hours of 8:30 a.m. and 4:30 p.m.  Voicemails left after 4:30 p.m. will not be returned until the following business day.  For prescription refill requests, have  your pharmacy contact our office.         Resources For Cancer Patients and their Caregivers ? American Cancer Society: Can assist with transportation, wigs, general needs, runs Look Good Feel Better.        504-259-8625 ? Cancer Care: Provides financial assistance, online support groups, medication/co-pay assistance.  1-800-813-HOPE (339) 344-2448) ? [facility] Assists [place] cancer patients and their families through emotional , educational and financial support.  228-757-2898 ? [place] DSS Where to apply for food stamps, Medicaid and utility assistance. 901-196-0240 ? [facility]: Transportation to medical appointments. (317)413-7191 ? Social Security Administration: May apply for disability if have a Stage IV cancer. [phone] ? [employer], Disability and Transit Services: Assists with nutrition, care and transit needs. [phone]

## 2015-03-31 NOTE — Progress Notes (Signed)
[place]  Progress Note  Patient Care Team: Timmothy Euler, MD as PCP - General (Family Medicine) Lorretta Harp, MD as Consulting Physician (Cardiology)  CHIEF COMPLAINTS/PURPOSE OF CONSULTATION:  24 hr urine with no M spike Proteinuria Serum protein electrophoresis with M spike 0.6 g/dl, IgG monoclonal protein with kappa light chain specificity POLYclonal gammopathy: IgA, kappa and lamdba typing appear increased  Stage III CKD History of Colon Cancer   HISTORY OF PRESENTING ILLNESS:  William Flores 65 y.o. male is here because of a monoclonal M spike and CKD.   William Flores was here alone today.  He says that he is pretty healthy most of the time until he started having problems with his heart.  He has recently been sick and went to a local urgent care over the weekend. He notes a significant cough and congestion. He started his antibiotic on Saturday (03/28/15) He is taking Tessalon for his cough. He did not take his medicine this morning because he did not know what he was going to have done today. He does report improvement in his symptoms. He denies fever.   His appetite is good but he has not eaten as much recently because he has been sick.  He has sleep apnea and notes that he is complaint with his CPAP.  He has atrial fibrillation and is on coumadin.   His PCP is Dr. Wendi Snipes. The patient notes he was sent to Dr. Hinda Lenis for CKD.  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 Renal biopsy has been discussed but the patient notes it has not been pursued. Workup also revealed a serum M-spike and he presents today for additional evaluation.   MEDICAL HISTORY:  Past Medical History  Diagnosis Date  . Diabetes ([place])   . Colon cancer ([place])   . Hypertension   . Sleep apnea   . Chronic atrial fibrillation ([place])   . Hyperlipidemia   . Chronic diastolic heart failure (H[facility])      HFPF - EF ~65-70% (OFTEN EXACERBATED BY AFIB)  . Colon polyps 09/21/2012    Tubular adenoma  . Obesity hypoventilation syndrome ([place])   . History of echocardiogram     Echo 05/16/14:  mild LVH, vigorous LVF, EF 65-70%, no RWMA, mean AV gradient 9 mmHg, MAC, mild MR, mod LAE, mild RVE, mild reduced RVF, severe RAE  . Cardiomyopathy ([place])     EF 35-40% in 2/16 in setting of AF with RVR >> echo 5/16 with improved LVF with EF 65-70%  . CAD (coronary artery disease)     LHC 2/08:  mRCA 50%, o/w no sig CAD, EF 25%  . CKD (chronic kidney disease)     hx of worsening renal failure and hyperK+ in setting of acute diast CHF >> required CVVHD in 4/16 and dialysis x 1 in 5/16    SURGICAL HISTORY: Past Surgical History  Procedure Laterality Date  . Colon resection    . Circumcision    . Colonoscopy N/A 09/21/2012    Procedure: COLONOSCOPY;  Surgeon: Inda Castle, MD;  Location: [facility] ENDOSCOPY;  Service: Endoscopy;  Laterality: N/A;  . US echocardiography  03/04/2010    abnormal study    SOCIAL HISTORY: Social History   Social History  . Marital Status: Divorced    Spouse Name: N/A  . Number of Children: 1  . 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 Smoking status: Former Smoker    Quit date: 08/06/1983  . Smokeless tobacco: Never Used  . Alcohol Use: No  . Drug Use: No  . Sexual Activity: Not on file   Other Topics Concern  . Not on file   Social History Narrative  Divorced 1 children-5 great grandchildren-1 great grandchild Hobbies-hunt and fish (last didn't get to a lot b/c leg hurts) hasn't smoked in 25-30 years No problems with ETOH Work-[employer] Mother died at 27 or 25. Died Was on Dialysis, took one of her legs fell out of her wheelchair and got worse after that. Father died at 73. Heart Attack. Had heart trouble. No brothers or sisters FAMILY HISTORY: Family History  Problem Relation Age of  Onset  . Breast cancer Mother   . Diabetes Mother   . Heart attack Father     ALLERGIES:  is allergic to codeine.  MEDICATIONS:  Current Outpatient Prescriptions  Medication Sig Dispense Refill  . allopurinol (ZYLOPRIM) 100 MG tablet TAKE 1 TABLET (100 MG TOTAL) BY MOUTH DAILY. 30 tablet 2  . atorvastatin (LIPITOR) 20 MG tablet TAKE 1 TABLET (20 MG TOTAL) BY MOUTH DAILY AT 6 PM. 30 tablet 5  . azelastine (OPTIVAR) 0.05 % ophthalmic solution Place 2 drops into both eyes 2 (two) times daily. Reported on 01/06/2015    . benzonatate (TESSALON) 100 MG capsule TAKE 1 CAPSULE (100 MG TOTAL) BY MOUTH 2 (TWO) TIMES DAILY AS NEEDED FOR COUGH. 45 capsule 0  . diltiazem (CARDIZEM CD) 180 MG 24 hr capsule TAKE 1 CAPSULE EVERY DAY 30 capsule 5  . ferrous sulfate 325 (65 FE) MG EC tablet Take 325 mg by mouth 2 (two) times daily.    . furosemide (LASIX) 80 MG tablet Pt takes 80 mg in the AM and 40 mg at PM as needed 45 tablet 3  . glucose blood test strip Use to check BG daily 100 each 2  . metoprolol (LOPRESSOR) 50 MG tablet Take 1 tablet (50 mg total) by mouth 2 (two) times daily. 180 tablet 1  . omeprazole (PRILOSEC) 20 MG capsule TAKE 1 CAPSULE (20 MG TOTAL) BY MOUTH DAILY. 30 capsule 4  . polyvinyl alcohol (LIQUIFILM TEARS) 1.4 % ophthalmic solution Place 1 drop into both eyes as needed for dry eyes. 15 mL 0  . potassium chloride SA (K-DUR,KLOR-CON) 20 MEQ tablet Take 20 mEq by mouth.    . sitaGLIPtin-metformin (JANUMET) 50-1000 MG per tablet Take 1 tablet by mouth 2 (two) times daily with a meal. Patient states he has been getting samples.    Marland Kitchen trimethoprim-polymyxin b (POLYTRIM) ophthalmic solution Place 2 drops into both eyes every 6 (six) hours. 10 mL 0  . warfarin (COUMADIN) 5 MG tablet TAKE 1 TO 2 TABLETS DAILY AS DIRECTED BY ANTICOAGULATION CLINIC 180 tablet 0  . [DISCONTINUED] diltiazem (TIAZAC) 240 MG 24 hr capsule TAKE 1 CAPSULE DAILY 30 capsule 3   No current facility-administered  medications for this visit.    Review of Systems  Constitutional: Negative.   HENT: Negative.   Eyes: Negative.   Respiratory: Positive for cough.        Hoarse cough. Takes Tessalon for this.  Cardiovascular: Negative.   Gastrointestinal: Negative.   Genitourinary: Negative.   Musculoskeletal: Negative.   Skin: Negative.   Neurological: Negative.   Endo/Heme/Allergies: Negative.   Psychiatric/Behavioral: Negative.   All other systems reviewed and are negative.  14 point ROS  was done and is otherwise as detailed above or in HPI   PHYSICAL EXAMINATION: ECOG PERFORMANCE STATUS: 1 - Symptomatic but completely ambulatory  Filed Vitals:   03/31/15 1400  BP: 97/54  Pulse: 62  Temp: 98 F (36.7 C)  Resp: 20   Filed Weights   03/31/15 1400  Weight: 271 lb 1.6 oz (122.97 kg)     Physical Exam  Constitutional: He is oriented to person, place, and time and well-developed, well-nourished, and in no distress. No distress.  HENT:  Head: Normocephalic and atraumatic.  Mouth/Throat: Oropharynx is clear and moist. No oropharyngeal exudate.  Eyes: Conjunctivae and EOM are normal. Pupils are equal, round, and reactive to light. Right eye exhibits no discharge. Left eye exhibits no discharge. No scleral icterus.  Neck: Normal range of motion. Neck supple. No tracheal deviation present. No thyromegaly present.  Cardiovascular: Normal rate, regular rhythm and normal heart sounds.  Exam reveals no gallop.   No murmur heard. Pulmonary/Chest: Effort normal and breath sounds normal.  Abdominal: Soft. Bowel sounds are normal. He exhibits no mass. There is no tenderness. There is no rebound and no guarding.  Umbilical Hernia, Obese  Musculoskeletal: Normal range of motion.  Neurological: He is alert and oriented to person, place, and time. No cranial nerve deficit. Gait normal.  Skin: Skin is warm and dry. No rash noted. He is not diaphoretic. No erythema.  Psychiatric: Mood, memory, affect  and judgment normal.  Nursing note and vitals reviewed.   LABORATORY DATA:  I have reviewed the data as listed  Results for NEWELL, WAFER (MRN [id number]) as of 03/31/2015 15:01  Ref. Range 02/11/2015 14:11 02/11/2015 14:14 02/16/2015 12:34 03/23/2015 13:59  Sodium Latest Ref Range: 134-144 mmol/L 144     Potassium Latest Ref Range: 3.5-5.2 mmol/L 4.8     Chloride Latest Ref Range: 96-106 mmol/L 97     CO2 Latest Ref Range: 18-29 mmol/L 26     BUN Latest Ref Range: 8-27 mg/dL 33 (H)     Creatinine Latest Ref Range: 0.76-1.27 mg/dL 1.45 (H)     Calcium Latest Ref Range: 8.6-10.2 mg/dL 9.1     EGFR (Non-African Amer.) Latest Ref Range: >59 mL/min/1.73 51 (L)     EGFR (African American) Latest Ref Range: >59 mL/min/1.73 59 (L)     Glucose Latest Ref Range: 65-99 mg/dL 95     BUN/Creatinine Ratio Latest Ref Range: 10-22  23 (H)     Phosphorus Latest Ref Range: 2.5-4.5 mg/dL 3.1     Albumin Latest Ref Range: 3.6-4.8 g/dL 3.7     Prothrombin Time Latest Units: sec    25.3  INR Latest Ref Range: 0.9-1.1   5.1 2.3 2.1 (H)     RADIOGRAPHIC STUDIES: I have personally reviewed the radiological images as listed and agreed with the findings in the report. Study Result     CLINICAL DATA: Chronic renal disease.  EXAM: RENAL / URINARY TRACT ULTRASOUND COMPLETE  COMPARISON: 03/03/2015.  FINDINGS: Right Kidney:  Length: 10.3 cm. Echogenicity within normal limits. No mass or hydronephrosis visualized.  Left Kidney:  Length: 13.9 cm. Echogenicity within normal limits. No mass or hydronephrosis visualized.  Bladder:  Appears normal for degree of bladder distention. Exam was suboptimal due to patient's body habitus.  IMPRESSION: Mild right renal atrophy cannot be excluded. Exam is otherwise unremarkable .   Electronically Signed  By: Marcello Moores Register  On: 03/03/2015 08:49     ASSESSMENT & PLAN:  24 hr urine with no  M spike Proteinuria Serum protein  electrophoresis with M spike 0.6 g/dl, IgG monoclonal protein with kappa light chain specificity POLYclonal gammopathy: IgA, kappa and lamdba typing appear increased  Stage III CKD History of Colon Cancer   I discussed monoclonal protein in the serum/urine with the patient and reviewed the work-up.  I have recommended the following:  Orders Placed This Encounter  Procedures  . DG Bone Survey Met    Standing Status: Future     Number of Occurrences:      Standing Expiration Date: 05/30/2016    Order Specific Question:  Reason for Exam (SYMPTOM  OR DIAGNOSIS REQUIRED)    Answer:  m-spike, chronic kidney disease    Order Specific Question:  Preferred imaging location?    Answer:  [facility]  . CBC with Differential    Standing Status: Future     Number of Occurrences: 1     Standing Expiration Date: 03/30/2016  . Comprehensive metabolic panel    Standing Status: Future     Number of Occurrences: 1     Standing Expiration Date: 03/30/2016  . IgG, IgA, IgM    Standing Status: Future     Number of Occurrences: 1     Standing Expiration Date: 03/30/2016  . Immunofixation electrophoresis    Standing Status: Future     Number of Occurrences: 1     Standing Expiration Date: 03/30/2016  . Protein electrophoresis, serum    Standing Status: Future     Number of Occurrences: 1     Standing Expiration Date: 03/30/2016  . Kappa/lambda light chains    Standing Status: Future     Number of Occurrences: 1     Standing Expiration Date: 03/30/2016   The diagnosis of monoclonal gammopathy of undetermined significance (MGUS) is usually incidental when a monoclonal protein <3 g/dL is found as part of the evaluation of another disorder, such as unexplained proteinuria,  At a minimum, patients suspected of having MGUS should be evaluated with the following studies: Complete blood count Serum calcium and creatinine Serum protein electrophoresis and immunofixation Urine protein  electrophoresis and immunofixation - The serum free light chain (FLC) assay can be used initially in place of urine studies. However, if a monoclonal protein is seen on serum studies or if the serum FLC ratio is abnormal, urine electrophoresis and immunofixation need to be performed. Serum FLC assay Quantitation of immunoglobulins Metastatic bone survey - Evaluation of the urine is usually performed using routine urinalysis and 24-hour urine collection with electrophoresis and immunofixation.  A bone marrow aspiration and biopsy is indicated in all patients: with an M protein ?1.5 g/dL,  patients with IgA MGUS of any size,  patients with an abnormal serum free light chain ratio (ie, ratio of kappa to lambda free light chains <0.26 or >1.65), and  in all patients who have any abnormalities of the complete blood count (CBC), serum creatinine, serum calcium, or radiographic bone survey  The only way to tell if the monoclonal protein is involved in the pathogenesis of kidney disease is via renal biopsy.  I will see him back in several weeks to review all studies and make any additional recommendations at that time.   All questions were answered. The patient knows to call the clinic with any problems, questions or concerns.  This document serves as a record of services personally performed by Ancil Linsey, MD. It was created on her behalf by Kandace Blitz, a trained medical scribe. The creation  of this record is based on the scribe's personal observations and the provider's statements to them. This document has been checked and approved by the attending provider.  I have reviewed the above documentation for accuracy and completeness, and I agree with the above.  This note was electronically signed.    Molli Hazard, MD  03/31/2015 3:36 PM

## 2015-04-01 LAB — PROTEIN ELECTROPHORESIS, SERUM
A/G Ratio: 0.8 (ref 0.7–1.7)
ALPHA-2-GLOBULIN: 1 g/dL (ref 0.4–1.0)
Albumin ELP: 3.3 g/dL (ref 2.9–4.4)
Alpha-1-Globulin: 0.2 g/dL (ref 0.0–0.4)
BETA GLOBULIN: 1.2 g/dL (ref 0.7–1.3)
Gamma Globulin: 1.4 g/dL (ref 0.4–1.8)
Globulin, Total: 3.9 g/dL (ref 2.2–3.9)
M-SPIKE, %: 0.8 g/dL — AB
Total Protein ELP: 7.2 g/dL (ref 6.0–8.5)

## 2015-04-01 LAB — IMMUNOFIXATION ELECTROPHORESIS
IGA: 385 mg/dL (ref 61–437)
IGM, SERUM: 39 mg/dL (ref 20–172)
IgG (Immunoglobin G), Serum: 1434 mg/dL (ref 700–1600)
TOTAL PROTEIN ELP: 7.1 g/dL (ref 6.0–8.5)

## 2015-04-01 LAB — IGG, IGA, IGM
IGG (IMMUNOGLOBIN G), SERUM: 1415 mg/dL (ref 700–1600)
IGM, SERUM: 40 mg/dL (ref 20–172)
IgA: 374 mg/dL (ref 61–437)

## 2015-04-01 LAB — KAPPA/LAMBDA LIGHT CHAINS
Kappa free light chain: 121.25 mg/L — ABNORMAL HIGH (ref 3.30–19.40)
Kappa, lambda light chain ratio: 1.32 (ref 0.26–1.65)
Lambda free light chains: 91.57 mg/L — ABNORMAL HIGH (ref 5.71–26.30)

## 2015-04-02 ENCOUNTER — Other Ambulatory Visit: Payer: Self-pay | Admitting: Physician Assistant

## 2015-04-03 ENCOUNTER — Encounter: Payer: Self-pay | Admitting: Family Medicine

## 2015-04-06 ENCOUNTER — Encounter: Payer: Self-pay | Admitting: Family Medicine

## 2015-04-13 ENCOUNTER — Ambulatory Visit (HOSPITAL_COMMUNITY): Payer: BLUE CROSS/BLUE SHIELD

## 2015-04-21 ENCOUNTER — Other Ambulatory Visit: Payer: Self-pay | Admitting: *Deleted

## 2015-04-21 MED ORDER — ALLOPURINOL 100 MG PO TABS: ORAL_TABLET | ORAL | Status: DC

## 2015-04-24 ENCOUNTER — Ambulatory Visit (HOSPITAL_COMMUNITY): Payer: BLUE CROSS/BLUE SHIELD | Admitting: Hematology & Oncology

## 2015-04-27 ENCOUNTER — Ambulatory Visit (INDEPENDENT_AMBULATORY_CARE_PROVIDER_SITE_OTHER): Payer: BLUE CROSS/BLUE SHIELD | Admitting: Pharmacist

## 2015-04-27 DIAGNOSIS — I4891 Unspecified atrial fibrillation: Secondary | ICD-10-CM | POA: Diagnosis not present

## 2015-04-27 LAB — COAGUCHEK XS/INR WAIVED
INR: 2.3 — AB (ref 0.9–1.1)
Prothrombin Time: 28.1 s

## 2015-04-27 NOTE — Patient Instructions (Signed)
Anticoagulation Dose Instructions as of 04/27/2015      William Flores Tue Wed Thu Fri Sat   New Dose 7.5 mg 7.5 mg 7.5 mg 7.5 mg 7.5 mg 7.5 mg 7.5 mg    Description        Continue current dose of warfarin 28m - take 1 and 1/2 tablets daily.         INR was 2.3 today

## 2015-04-28 ENCOUNTER — Encounter (HOSPITAL_COMMUNITY): Payer: BLUE CROSS/BLUE SHIELD | Attending: Hematology & Oncology | Admitting: Hematology & Oncology

## 2015-04-28 ENCOUNTER — Encounter (HOSPITAL_COMMUNITY): Payer: Self-pay | Admitting: Hematology & Oncology

## 2015-04-28 VITALS — BP 138/87 | HR 62 | Temp 98.4°F | Resp 18 | Wt 280.0 lb

## 2015-04-28 DIAGNOSIS — Z85038 Personal history of other malignant neoplasm of large intestine: Secondary | ICD-10-CM

## 2015-04-28 DIAGNOSIS — Z86010 Personal history of colonic polyps: Secondary | ICD-10-CM | POA: Insufficient documentation

## 2015-04-28 DIAGNOSIS — R809 Proteinuria, unspecified: Secondary | ICD-10-CM

## 2015-04-28 DIAGNOSIS — E785 Hyperlipidemia, unspecified: Secondary | ICD-10-CM | POA: Insufficient documentation

## 2015-04-28 DIAGNOSIS — E662 Morbid (severe) obesity with alveolar hypoventilation: Secondary | ICD-10-CM | POA: Insufficient documentation

## 2015-04-28 DIAGNOSIS — D890 Polyclonal hypergammaglobulinemia: Secondary | ICD-10-CM | POA: Diagnosis not present

## 2015-04-28 DIAGNOSIS — D472 Monoclonal gammopathy: Secondary | ICD-10-CM

## 2015-04-28 DIAGNOSIS — I5032 Chronic diastolic (congestive) heart failure: Secondary | ICD-10-CM | POA: Insufficient documentation

## 2015-04-28 DIAGNOSIS — E1122 Type 2 diabetes mellitus with diabetic chronic kidney disease: Secondary | ICD-10-CM | POA: Insufficient documentation

## 2015-04-28 DIAGNOSIS — I482 Chronic atrial fibrillation: Secondary | ICD-10-CM | POA: Insufficient documentation

## 2015-04-28 DIAGNOSIS — Z9889 Other specified postprocedural states: Secondary | ICD-10-CM | POA: Insufficient documentation

## 2015-04-28 DIAGNOSIS — I2510 Atherosclerotic heart disease of native coronary artery without angina pectoris: Secondary | ICD-10-CM | POA: Insufficient documentation

## 2015-04-28 DIAGNOSIS — N183 Chronic kidney disease, stage 3 (moderate): Secondary | ICD-10-CM | POA: Diagnosis not present

## 2015-04-28 DIAGNOSIS — Z87891 Personal history of nicotine dependence: Secondary | ICD-10-CM | POA: Insufficient documentation

## 2015-04-28 DIAGNOSIS — Z7984 Long term (current) use of oral hypoglycemic drugs: Secondary | ICD-10-CM | POA: Insufficient documentation

## 2015-04-28 DIAGNOSIS — Z79899 Other long term (current) drug therapy: Secondary | ICD-10-CM | POA: Insufficient documentation

## 2015-04-28 DIAGNOSIS — I129 Hypertensive chronic kidney disease with stage 1 through stage 4 chronic kidney disease, or unspecified chronic kidney disease: Secondary | ICD-10-CM | POA: Insufficient documentation

## 2015-04-28 NOTE — Patient Instructions (Addendum)
[place] at [facility] Discharge Instructions  RECOMMENDATIONS MADE BY THE CONSULTANT AND ANY TEST RESULTS WILL BE SENT TO YOUR REFERRING PHYSICIAN.   Exam and discussion by Dr Whitney Muse today Bone marrow biopsy at [facility], they will put in an IV and make you sleepy, takes about 10 minutes, go home with a bandaid, make sure you have a driver Return to see the doctor in 1 month Please call the clinic if you have any questions or concerns     Bone Marrow Aspiration and Bone Marrow Biopsy Bone marrow aspiration and bone marrow biopsy are procedures that are done to diagnose blood disorders. You may also have one of these procedures to help diagnose infections or some types of cancer. Bone marrow is the soft tissue that is inside your bones. Blood cells are produced in bone marrow. For bone marrow aspiration, a sample of tissue in liquid form is removed from inside your bone. For a bone marrow biopsy, a small core of bone marrow tissue is removed. Then these samples are examined under a microscope or tested in a lab. You may need these procedures if you have an abnormal complete blood count (CBC). The aspiration or biopsy sample is usually taken from the top of your hip bone. Sometimes, an aspiration sample is taken from your chest bone (sternum). LET [facility] CARE PROVIDER KNOW ABOUT:  Any allergies you have.  All medicines you are taking, including vitamins, herbs, eye drops, creams, and over-the-counter medicines.  Previous problems you or members of your family have had with the use of anesthetics.  Any blood disorders you have.  Previous surgeries you have had.  Any medical conditions you may have.  Whether you are pregnant or you think that you may be pregnant. RISKS AND COMPLICATIONS Generally, this is a safe procedure. However, problems may occur, including:  Infection.  Bleeding. BEFORE THE PROCEDURE  Ask your health care provider about:  Changing  or stopping your regular medicines. This is especially important if you are taking diabetes medicines or blood thinners.  Taking medicines such as aspirin and ibuprofen. These medicines can thin your blood. Do not take these medicines before your procedure if your health care provider instructs you not to.  Plan to have someone take you home after the procedure.  If you go home right after the procedure, plan to have someone with you for 24 hours. PROCEDURE   An IV tube may be inserted into one of your veins.  The injection site will be cleaned with a germ-killing solution (antiseptic).  You will be given one or more of the following:  A medicine that helps you relax (sedative).  A medicine that numbs the area (local anesthetic).  The bone marrow sample will be removed as follows:  For an aspiration, a hollow needle will be inserted through your skin and into your bone. Bone marrow fluid will be drawn up into a syringe.  For a biopsy, your health care provider will use a hollow needle to remove a core of tissue from your bone marrow.  The needle will be removed.  A bandage (dressing) will be placed over the insertion site and taped in place. The procedure may vary among health care providers and hospitals. AFTER THE PROCEDURE  Your blood pressure, heart rate, breathing rate, and blood oxygen level will be monitored often until the medicines you were given have worn off.  Return to your normal activities as directed by your health care  provider.   This information is not intended to replace advice given to you by your health care provider. Make sure you discuss any questions you have with your health care provider.   Document Released: 12/24/2003 Document Revised: 05/06/2014 Document Reviewed: 12/11/2013 Elsevier Interactive Patient Education 2016 [employer].      Thank you for choosing [place] at [facility] to provide your oncology and  hematology care.  To afford each patient quality time with our provider, please arrive at least 15 minutes before your scheduled appointment time.   Beginning January 23rd 2017 lab work for the [name] will be done in the  Main lab at [facility] on 1st floor. If you have a lab appointment with the [place] please come in thru the  Main Entrance and check in at the main information desk  You need to re-schedule your appointment should you arrive 10 or more minutes late.  We strive to give you quality time with our providers, and arriving late affects you and other patients whose appointments are after yours.  Also, if you no show three or more times for appointments you may be dismissed from the clinic at the providers discretion.     Again, thank you for choosing [facility].  Our hope is that these requests will decrease the amount of time that you wait before being seen by our physicians.       _____________________________________________________________  Should you have questions after your visit to [facility], please contact our office at [phone] between the hours of 8:30 a.m. and 4:30 p.m.  Voicemails left after 4:30 p.m. will not be returned until the following business day.  For prescription refill requests, have your pharmacy contact our office.         Resources For Cancer Patients and their Caregivers ? American Cancer Society: Can assist with transportation, wigs, general needs, runs Look Good Feel Better.        (714)621-1652 ? Cancer Care: Provides financial assistance, online support groups, medication/co-pay assistance.  1-800-813-HOPE 226-001-9941) ? [place] Assists [place] cancer patients and their families through emotional , educational and financial support.  (351) 099-0351 ? [place] DSS Where to apply for food stamps, Medicaid and utility assistance. (847)054-9670 ? [facility]: Transportation to  medical appointments. (854) 624-3058 ? Social Security Administration: May apply for disability if have a Stage IV cancer. 680-769-8679 514-343-1174 ? [employer], Disability and Transit Services: Assists with nutrition, care and transit needs. (219)599-5425

## 2015-04-28 NOTE — Progress Notes (Signed)
[place] at [place]  NOTE  Patient Care Team: Timmothy Euler, MD as PCP - General (Family Medicine) Lorretta Harp, MD as Consulting Physician (Cardiology)  CHIEF COMPLAINTS/PURPOSE OF CONSULTATION:  24 hr urine with no M spike Proteinuria Serum protein electrophoresis with M spike 0.6 g/dl, IgG monoclonal protein with kappa light chain specificity POLYclonal gammopathy: IgA, kappa and lamdba typing appear increased  Stage III CKD History of Colon Cancer   HISTORY OF PRESENTING ILLNESS:  William Flores 65 y.o. male is here because of a monoclonal M spike and CKD.   [name] was here alone today. He is here to review laboratory studies from his initial visit and for additional recommendations.   The patient notes he was sent to Dr. Hinda Lenis for CKD.  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 Renal biopsy has been discussed but the patient notes it has not been pursued. Workup also revealed a serum M-spike.  Mr. Lamagna returns to the [place] alone today.  The majority of the appointment was spent discussing his necessity to obtain a bone marrow biopsy. He had no question today, noting "if that's what I need to have done, I'll do it."  He denies any change in his baseline from his prior visit.    MEDICAL HISTORY:  Past Medical History  Diagnosis Date  . Diabetes ([place])   . Colon cancer ([place])   . Hypertension   . Sleep apnea   . Chronic atrial fibrillation ([place])   . Hyperlipidemia   . Chronic diastolic heart failure (HCC)     HFPF - EF ~65-70% (OFTEN EXACERBATED BY AFIB)  . Colon polyps 09/21/2012    Tubular adenoma  . Obesity hypoventilation syndrome ([place])   . History of echocardiogram     Echo 05/16/14:  mild LVH, vigorous LVF, EF 65-70%, no RWMA, mean AV gradient 9 mmHg, MAC, mild MR, mod LAE, mild RVE, mild reduced RVF, severe RAE  .  Cardiomyopathy ([place])     EF 35-40% in 2/16 in setting of AF with RVR >> echo 5/16 with improved LVF with EF 65-70%  . CAD (coronary artery disease)     LHC 2/08:  mRCA 50%, o/w no sig CAD, EF 25%  . CKD (chronic kidney disease)     hx of worsening renal failure and hyperK+ in setting of acute diast CHF >> required CVVHD in 4/16 and dialysis x 1 in 5/16    SURGICAL HISTORY: Past Surgical History  Procedure Laterality Date  . Colon resection    . Circumcision    . Colonoscopy N/A 09/21/2012    Procedure: COLONOSCOPY;  Surgeon: Inda Castle, MD;  Location: [facility] ENDOSCOPY;  Service: Endoscopy;  Laterality: N/A;  . US echocardiography  03/04/2010    abnormal study    SOCIAL HISTORY: Social History   Social History  . Marital Status: Divorced    Spouse Name: N/A  . Number of Children: 1  . 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 Smoking status: Former Smoker    Quit date: 08/06/1983  . Smokeless tobacco: Never Used  . Alcohol Use: No  . Drug Use: No  . Sexual Activity: Not on file   Other Topics Concern  . Not on file   Social History Narrative  Divorced 1 children-5 great grandchildren-1 great  grandchild Hobbies-hunt and fish (last didn't get to a lot b/c leg hurts) hasn't smoked in 25-30 years No problems with ETOH Work-[employer] Mother died at 3 or 27. Died Was on Dialysis, took one of her legs fell out of her wheelchair and got worse after that. Father died at 8. Heart Attack. Had heart trouble. No brothers or sisters FAMILY HISTORY: Family History  Problem Relation Age of Onset  . Breast cancer Mother   . Diabetes Mother   . Heart attack Father     ALLERGIES:  is allergic to codeine.  MEDICATIONS:  Current Outpatient Prescriptions  Medication Sig Dispense Refill  . allopurinol (ZYLOPRIM) 100 MG tablet TAKE 1 TABLET (100 MG TOTAL) BY MOUTH DAILY. 30 tablet 3  . atorvastatin (LIPITOR)  20 MG tablet TAKE 1 TABLET (20 MG TOTAL) BY MOUTH DAILY AT 6 PM. 30 tablet 5  . azelastine (OPTIVAR) 0.05 % ophthalmic solution Place 2 drops into both eyes 2 (two) times daily. Reported on 01/06/2015    . diltiazem (CARDIZEM CD) 180 MG 24 hr capsule TAKE 1 CAPSULE EVERY DAY 30 capsule 5  . ferrous sulfate 325 (65 FE) MG EC tablet Take 325 mg by mouth 2 (two) times daily.    . furosemide (LASIX) 80 MG tablet Pt takes 80 mg in the AM and 40 mg at PM as needed 45 tablet 3  . glucose blood test strip Use to check BG daily 100 each 2  . KLOR-CON M20 20 MEQ tablet TAKE 1/2 TABLET BY MOUTH DAILY 30 tablet 5  . metoprolol (LOPRESSOR) 50 MG tablet Take 1 tablet (50 mg total) by mouth 2 (two) times daily. 180 tablet 1  . omeprazole (PRILOSEC) 20 MG capsule TAKE 1 CAPSULE (20 MG TOTAL) BY MOUTH DAILY. 30 capsule 4  . polyvinyl alcohol (LIQUIFILM TEARS) 1.4 % ophthalmic solution Place 1 drop into both eyes as needed for dry eyes. 15 mL 0  . potassium chloride SA (K-DUR,KLOR-CON) 20 MEQ tablet Take 20 mEq by mouth.    . sitaGLIPtin-metformin (JANUMET) 50-1000 MG per tablet Take 1 tablet by mouth 2 (two) times daily with a meal. Patient states he has been getting samples.    Marland Kitchen trimethoprim-polymyxin b (POLYTRIM) ophthalmic solution Place 2 drops into both eyes every 6 (six) hours. 10 mL 0  . warfarin (COUMADIN) 5 MG tablet TAKE 1 TO 2 TABLETS DAILY AS DIRECTED BY ANTICOAGULATION CLINIC 180 tablet 0  . [DISCONTINUED] diltiazem (TIAZAC) 240 MG 24 hr capsule TAKE 1 CAPSULE DAILY 30 capsule 3   No current facility-administered medications for this visit.    Review of Systems  Constitutional: Negative.   HENT: Negative.   Eyes: Negative.   Respiratory: Positive for cough.        Hoarse cough. Takes Tessalon for this.  Cardiovascular: Negative.   Gastrointestinal: Negative.   Genitourinary: Negative.   Musculoskeletal: Negative.   Skin: Negative.   Neurological: Negative.   Endo/Heme/Allergies: Negative.     Psychiatric/Behavioral: Negative.   All other systems reviewed and are negative.  14 point ROS was done and is otherwise as detailed above or in HPI    PHYSICAL EXAMINATION: ECOG PERFORMANCE STATUS: 1 - Symptomatic but completely ambulatory  Filed Vitals:   04/28/15 1100  BP: 138/87  Pulse: 62  Temp: 98.4 F (36.9 C)  Resp: 18   Filed Weights   04/28/15 1100  Weight: 280 lb (127.007 kg)     Physical Exam  Constitutional: He is oriented to person,  place, and time and well-developed, well-nourished, and in no distress. No distress.  HENT:  Head: Normocephalic and atraumatic.  Mouth/Throat: Oropharynx is clear and moist. No oropharyngeal exudate.  Eyes: Conjunctivae and EOM are normal. Pupils are equal, round, and reactive to light. Right eye exhibits no discharge. Left eye exhibits no discharge. No scleral icterus.  Neck: Normal range of motion. Neck supple. No tracheal deviation present. No thyromegaly present.  Cardiovascular: Normal rate, regular rhythm and normal heart sounds.  Exam reveals no gallop.   No murmur heard. Pulmonary/Chest: Effort normal and breath sounds normal.  Abdominal: Soft. Bowel sounds are normal. He exhibits no mass. There is no tenderness. There is no rebound and no guarding.  Umbilical Hernia, Obese  Musculoskeletal: Normal range of motion.  Neurological: He is alert and oriented to person, place, and time. No cranial nerve deficit. Gait normal.  Skin: Skin is warm and dry. No rash noted. He is not diaphoretic. No erythema.  Psychiatric: Mood, memory, affect and judgment normal.  Nursing note and vitals reviewed.   LABORATORY DATA:  I have reviewed the data as listed  CBC    Component Value Date/Time   WBC 4.4 03/31/2015 1543   WBC 6.6 11/04/2014 0951   WBC 6.1 06/04/2014 1313   RBC 5.08 03/31/2015 1543   RBC 5.20 11/04/2014 0951   RBC 4.07* 06/04/2014 1313   HGB 14.7 03/31/2015 1543   HGB 10.4* 06/04/2014 1313   HCT 45.0 03/31/2015  1543   HCT 44.6 11/04/2014 0951   HCT 34.8* 06/04/2014 1313   PLT 117* 03/31/2015 1543   PLT 171 11/04/2014 0951   MCV 88.6 03/31/2015 1543   MCV 86 11/04/2014 0951   MCV 85.6 06/04/2014 1313   MCH 28.9 03/31/2015 1543   MCH 26.7 11/04/2014 0951   MCH 25.6* 06/04/2014 1313   MCHC 32.7 03/31/2015 1543   MCHC 31.2* 11/04/2014 0951   MCHC 29.9* 06/04/2014 1313   RDW 17.7* 03/31/2015 1543   RDW 19.7* 11/04/2014 0951   LYMPHSABS 1.1 03/31/2015 1543   MONOABS 0.4 03/31/2015 1543   EOSABS 0.1 03/31/2015 1543   BASOSABS 0.0 03/31/2015 1543    CMP     Component Value Date/Time   NA 137 03/31/2015 1543   NA 144 02/11/2015 1411   K 4.2 03/31/2015 1543   CL 102 03/31/2015 1543   CO2 24 03/31/2015 1543   GLUCOSE 110* 03/31/2015 1543   GLUCOSE 95 02/11/2015 1411   BUN 54* 03/31/2015 1543   BUN 33* 02/11/2015 1411   CREATININE 2.56* 03/31/2015 1543   CREATININE 1.12 05/25/2012 1556   CALCIUM 8.6* 03/31/2015 1543   PROT 7.9 03/31/2015 1543   PROT 6.8 09/25/2014 0924   ALBUMIN 3.8 03/31/2015 1543   ALBUMIN 3.7 02/11/2015 1411   AST 37 03/31/2015 1543   ALT 23 03/31/2015 1543   ALKPHOS 55 03/31/2015 1543   BILITOT 0.7 03/31/2015 1543   BILITOT 0.5 09/25/2014 0924   GFRNONAA 25* 03/31/2015 1543   GFRNONAA 71 05/25/2012 1556   GFRAA 29* 03/31/2015 1543   GFRAA 82 05/25/2012 1556    Results for LEWIN, PELLOW (MRN [id number]) as of 04/28/2015 11:28  Ref. Range 04/27/2015 14:01  Prothrombin Time Latest Units: sec 28.1  INR Latest Ref Range: 0.9-1.1  2.3 (H)         RADIOGRAPHIC STUDIES: I have personally reviewed the radiological images as listed and agreed with the findings in the report. CLINICAL DATA: Chronic kidney disease. Colon cancer.  EXAM:  METASTATIC BONE SURVEY  COMPARISON: None.  FINDINGS: Diffuse degenerative disc and facet disease throughout the cervical spine. Degenerative disc disease in the thoracic and lumbar spine. Degenerative changes within  the shoulders, hips and knees. No focal lytic or sclerotic bone lesion. No acute bony abnormality.  Cardiomegaly with vascular congestion. No confluent airspace opacities or effusions.  IMPRESSION: No focal bony abnormality or acute bony abnormality. Degenerative changes as above.  Cardiomegaly, vascular congestion.   Electronically Signed  By: Rolm Baptise M.D.  On: 03/31/2015 16:49  ASSESSMENT & PLAN:  24 hr urine with no M spike Proteinuria Serum protein electrophoresis with M spike 0.6 g/dl, IgG monoclonal protein with kappa light chain specificity POLYclonal gammopathy: IgA, kappa and lamdba typing appear increased  Stage III CKD History of Colon Cancer Metastatic Bone Survey with no focal bony abnormality or acute bony abnormality  I reviewed laboratory studies and his bone survey. We discussed his CKD.  I have recommended proceeding with BMBX for complete evaluation. He is agreeable. Details of the procedure were discussed and will be arranged at [facility] with follow-up in 2 weeks.   A bone marrow aspiration and biopsy is indicated in all patients: with an M protein ?1.5 g/dL,  patients with IgA MGUS of any size,  patients with an abnormal serum free light chain ratio (ie, ratio of kappa to lambda free light chains <0.26 or >1.65), and  in all patients who have any abnormalities of the complete blood count (CBC), serum creatinine, serum calcium, or radiographic bone survey  The only way to tell if the monoclonal protein is involved in the pathogenesis of kidney disease is via renal biopsy.  Orders Placed This Encounter  Procedures  . CT Biopsy    Standing Status: Future     Number of Occurrences:      Standing Expiration Date: 04/27/2016    Order Specific Question:  Lab orders requested (DO NOT place separate lab orders, these will be automatically ordered during procedure specimen collection):    Answer:  Surgical Pathology    Order Specific Question:  Reason  for Exam (SYMPTOM  OR DIAGNOSIS REQUIRED)    Answer:  MGUS, bone marrow with flow cytometry and cytogenetics    Order Specific Question:  Preferred imaging location?    Answer:  [facility]    All questions were answered. The patient knows to call the clinic with any problems, questions or concerns.  This document serves as a record of services personally performed by Ancil Linsey, MD. It was created on her behalf by Toni Amend, a trained medical scribe. The creation of this record is based on the scribe's personal observations and the provider's statements to them. This document has been checked and approved by the attending provider.  I have reviewed the above documentation for accuracy and completeness, and I agree with the above.  This note was electronically signed.    Molli Hazard, MD  04/28/2015 11:32 AM

## 2015-04-29 ENCOUNTER — Encounter (HOSPITAL_COMMUNITY): Payer: Self-pay | Admitting: Hematology & Oncology

## 2015-05-03 ENCOUNTER — Other Ambulatory Visit: Payer: Self-pay | Admitting: Radiology

## 2015-05-04 ENCOUNTER — Other Ambulatory Visit (HOSPITAL_COMMUNITY): Payer: Self-pay | Admitting: Nephrology

## 2015-05-04 ENCOUNTER — Other Ambulatory Visit: Payer: Self-pay | Admitting: Radiology

## 2015-05-05 ENCOUNTER — Ambulatory Visit (HOSPITAL_COMMUNITY)
Admission: RE | Admit: 2015-05-05 | Discharge: 2015-05-05 | Disposition: A | Discharge status: Home / Self Care | Payer: BLUE CROSS/BLUE SHIELD | Source: Ambulatory Visit | Attending: Hematology & Oncology | Admitting: Hematology & Oncology

## 2015-05-05 ENCOUNTER — Encounter (HOSPITAL_COMMUNITY): Payer: Self-pay

## 2015-05-05 DIAGNOSIS — I2510 Atherosclerotic heart disease of native coronary artery without angina pectoris: Secondary | ICD-10-CM | POA: Insufficient documentation

## 2015-05-05 DIAGNOSIS — Z8249 Family history of ischemic heart disease and other diseases of the circulatory system: Secondary | ICD-10-CM | POA: Diagnosis not present

## 2015-05-05 DIAGNOSIS — N189 Chronic kidney disease, unspecified: Secondary | ICD-10-CM | POA: Insufficient documentation

## 2015-05-05 DIAGNOSIS — E1122 Type 2 diabetes mellitus with diabetic chronic kidney disease: Secondary | ICD-10-CM | POA: Insufficient documentation

## 2015-05-05 DIAGNOSIS — R809 Proteinuria, unspecified: Secondary | ICD-10-CM | POA: Diagnosis present

## 2015-05-05 DIAGNOSIS — E785 Hyperlipidemia, unspecified: Secondary | ICD-10-CM | POA: Diagnosis not present

## 2015-05-05 DIAGNOSIS — Z833 Family history of diabetes mellitus: Secondary | ICD-10-CM | POA: Diagnosis not present

## 2015-05-05 DIAGNOSIS — D72822 Plasmacytosis: Secondary | ICD-10-CM | POA: Diagnosis not present

## 2015-05-05 DIAGNOSIS — I482 Chronic atrial fibrillation: Secondary | ICD-10-CM | POA: Insufficient documentation

## 2015-05-05 DIAGNOSIS — Z7984 Long term (current) use of oral hypoglycemic drugs: Secondary | ICD-10-CM | POA: Insufficient documentation

## 2015-05-05 DIAGNOSIS — Z7901 Long term (current) use of anticoagulants: Secondary | ICD-10-CM | POA: Diagnosis not present

## 2015-05-05 DIAGNOSIS — G4730 Sleep apnea, unspecified: Secondary | ICD-10-CM | POA: Insufficient documentation

## 2015-05-05 DIAGNOSIS — Z79899 Other long term (current) drug therapy: Secondary | ICD-10-CM | POA: Insufficient documentation

## 2015-05-05 DIAGNOSIS — Z87891 Personal history of nicotine dependence: Secondary | ICD-10-CM | POA: Diagnosis not present

## 2015-05-05 DIAGNOSIS — I129 Hypertensive chronic kidney disease with stage 1 through stage 4 chronic kidney disease, or unspecified chronic kidney disease: Secondary | ICD-10-CM | POA: Diagnosis not present

## 2015-05-05 DIAGNOSIS — Z85038 Personal history of other malignant neoplasm of large intestine: Secondary | ICD-10-CM | POA: Insufficient documentation

## 2015-05-05 DIAGNOSIS — D472 Monoclonal gammopathy: Secondary | ICD-10-CM

## 2015-05-05 LAB — CBC WITH DIFFERENTIAL/PLATELET
BASOS PCT: 1 %
Basophils Absolute: 0.1 10*3/uL (ref 0.0–0.1)
EOS ABS: 0.1 10*3/uL (ref 0.0–0.7)
Eosinophils Relative: 2 %
HCT: 42.6 % (ref 39.0–52.0)
HEMOGLOBIN: 13.6 g/dL (ref 13.0–17.0)
LYMPHS ABS: 1.2 10*3/uL (ref 0.7–4.0)
Lymphocytes Relative: 19 %
MCH: 29.2 pg (ref 26.0–34.0)
MCHC: 31.9 g/dL (ref 30.0–36.0)
MCV: 91.4 fL (ref 78.0–100.0)
MONO ABS: 0.6 10*3/uL (ref 0.1–1.0)
MONOS PCT: 10 %
Neutro Abs: 4.5 10*3/uL (ref 1.7–7.7)
Neutrophils Relative %: 68 %
Platelets: 161 10*3/uL (ref 150–400)
RBC: 4.66 MIL/uL (ref 4.22–5.81)
RDW: 19.2 % — AB (ref 11.5–15.5)
WBC: 6.5 10*3/uL (ref 4.0–10.5)

## 2015-05-05 LAB — GLUCOSE, CAPILLARY: Glucose-Capillary: 184 mg/dL — ABNORMAL HIGH (ref 65–99)

## 2015-05-05 LAB — PROTIME-INR
INR: 2 — AB (ref 0.00–1.49)
PROTHROMBIN TIME: 21.9 s — AB (ref 11.6–15.2)

## 2015-05-05 LAB — BONE MARROW EXAM

## 2015-05-05 MED ORDER — MIDAZOLAM HCL 2 MG/2ML IJ SOLN
INTRAMUSCULAR | Status: AC | PRN
  Administered 2015-05-05: 1 mg via INTRAVENOUS

## 2015-05-05 MED ORDER — SODIUM CHLORIDE 0.9 % IV SOLN: INTRAVENOUS | Status: DC

## 2015-05-05 MED ORDER — FENTANYL CITRATE (PF) 100 MCG/2ML IJ SOLN
INTRAMUSCULAR | Status: AC
  Filled 2015-05-05: qty 2

## 2015-05-05 MED ORDER — FENTANYL CITRATE (PF) 100 MCG/2ML IJ SOLN
INTRAMUSCULAR | Status: AC | PRN
  Administered 2015-05-05: 50 ug via INTRAVENOUS

## 2015-05-05 MED ORDER — MIDAZOLAM HCL 2 MG/2ML IJ SOLN
INTRAMUSCULAR | Status: AC
  Filled 2015-05-05: qty 4

## 2015-05-05 MED ORDER — MIDAZOLAM HCL 2 MG/2ML IJ SOLN
INTRAMUSCULAR | Status: AC
  Filled 2015-05-05: qty 6

## 2015-05-05 MED ORDER — FENTANYL CITRATE (PF) 100 MCG/2ML IJ SOLN
INTRAMUSCULAR | Status: AC
  Filled 2015-05-05: qty 4

## 2015-05-05 NOTE — H&P (Signed)
Chief Complaint: proteinuria  Referring Physician: Dr. Ancil Linsey  Supervising Physician: Aletta Edouard  Patient Status: Out-pt  HPI: William Flores is an 65 y.o. male with an extensive PMH who is on coumadin for A fib has been followed by a nephrologist for a while.  He was noted to have an increase in protein in his urine and some other abnormalities.  A request has been made for a bone marrow biopsy to rule out MGUS.  He has been feeling well recently otherwise.  He does admit to some occasional SOB with his A fib, but otherwise no new complaints.  Past Medical History:  Past Medical History  Diagnosis Date  . Diabetes ([place])   . Colon cancer ([place])   . Hypertension   . Sleep apnea   . Chronic atrial fibrillation ([place])   . Hyperlipidemia   . Chronic diastolic heart failure ([facility])     HFPF - EF ~65-70% (OFTEN EXACERBATED BY AFIB)  . Colon polyps 09/21/2012    Tubular adenoma  . Obesity hypoventilation syndrome ([place])   . History of echocardiogram     Echo 05/16/14:  mild LVH, vigorous LVF, EF 65-70%, no RWMA, mean AV gradient 9 mmHg, MAC, mild MR, mod LAE, mild RVE, mild reduced RVF, severe RAE  . Cardiomyopathy ([place])     EF 35-40% in 2/16 in setting of AF with RVR >> echo 5/16 with improved LVF with EF 65-70%  . CAD (coronary artery disease)     LHC 2/08:  mRCA 50%, o/w no sig CAD, EF 25%  . CKD (chronic kidney disease)     hx of worsening renal failure and hyperK+ in setting of acute diast CHF >> required CVVHD in 4/16 and dialysis x 1 in 5/16    Past Surgical History:  Past Surgical History  Procedure Laterality Date  . Colon resection    . Circumcision    . Colonoscopy N/A 09/21/2012    Procedure: COLONOSCOPY;  Surgeon: Inda Castle, MD;  Location: [facility];  Service: Endoscopy;  Laterality: N/A;  . US echocardiography  03/04/2010    abnormal study    Family History:  Family History  Problem Relation Age of Onset  . Breast cancer Mother   . Diabetes  Mother   . Heart attack Father     Social History:  reports that he quit smoking about 31 years ago. He has never used smokeless tobacco. He reports that he does not drink alcohol or use illicit drugs.  Allergies:  Allergies  Allergen Reactions  . Codeine     Headache     Medications:   Medication List    ASK your doctor about these medications        allopurinol 100 MG tablet  Commonly known as:  ZYLOPRIM  TAKE 1 TABLET (100 MG TOTAL) BY MOUTH DAILY.     atorvastatin 20 MG tablet  Commonly known as:  LIPITOR  TAKE 1 TABLET (20 MG TOTAL) BY MOUTH DAILY AT 6 PM.     azelastine 0.05 % ophthalmic solution  Commonly known as:  OPTIVAR  Place 2 drops into both eyes 2 (two) times daily. Reported on 01/06/2015     diltiazem 180 MG 24 hr capsule  Commonly known as:  CARDIZEM CD  TAKE 1 CAPSULE EVERY DAY     ferrous sulfate 325 (65 FE) MG EC tablet  Take 325 mg by mouth 2 (two) times daily.     furosemide 80 MG tablet  Commonly known as:  LASIX  Pt takes 80 mg in the AM and 40 mg at PM as needed     glucose blood test strip  Use to check BG daily     metoprolol 50 MG tablet  Commonly known as:  LOPRESSOR  Take 1 tablet (50 mg total) by mouth 2 (two) times daily.     omeprazole 20 MG capsule  Commonly known as:  PRILOSEC  TAKE 1 CAPSULE (20 MG TOTAL) BY MOUTH DAILY.     polyvinyl alcohol 1.4 % ophthalmic solution  Commonly known as:  LIQUIFILM TEARS  Place 1 drop into both eyes as needed for dry eyes.     potassium chloride SA 20 MEQ tablet  Commonly known as:  K-DUR,KLOR-CON  Take 20 mEq by mouth.     KLOR-CON M20 20 MEQ tablet  Generic drug:  potassium chloride SA  TAKE 1/2 TABLET BY MOUTH DAILY     sitaGLIPtin-metformin 50-1000 MG tablet  Commonly known as:  JANUMET  Take 1 tablet by mouth 2 (two) times daily with a meal. Patient states he has been getting samples.     trimethoprim-polymyxin b ophthalmic solution  Commonly known as:  POLYTRIM  Place 2  drops into both eyes every 6 (six) hours.     warfarin 5 MG tablet  Commonly known as:  COUMADIN  TAKE 1 TO 2 TABLETS DAILY AS DIRECTED BY ANTICOAGULATION CLINIC        Please HPI for pertinent positives, otherwise complete 10 system ROS negative.  Mallampati Score: MD Evaluation Airway: WNL Heart: WNL Abdomen: WNL Chest/ Lungs: WNL ASA  Classification: 3 Mallampati/Airway Score: Two  Physical Exam: BP 168/87 mmHg  Pulse 67 There is no weight on file to calculate BMI. General: pleasant,morbidly obese white male who is laying in bed in NAD HEENT: head is normocephalic, atraumatic.  Sclera are noninjected.  PERRL.  Ears and nose without any masses or lesions.  Mouth is pink and moist Heart: irregularly irregular.  Normal s1,s2. No obvious murmurs, gallops, or rubs noted.  Palpable radial and pedal pulses bilaterally Lungs: CTAB, no wheezes, rhonchi, or rales noted.  Respiratory effort nonlabored Abd: soft, NT, ND, +BS, no masses or organomegaly, + reducible ventral hernia MS: all 4 extremities are symmetrical with no cyanosis, clubbing.  Trace edema and chronic venous stasis changes noted in his bilateral lower extremities Psych: A&Ox3 with an appropriate affect.   Labs: Results for orders placed or performed during the hospital encounter of 05/05/15 (from the past 48 hour(s))  CBC with Differential/Platelet     Status: Abnormal   Collection Time: 05/05/15  9:42 AM  Result Value Ref Range   WBC 6.5 4.0 - 10.5 K/uL   RBC 4.66 4.22 - 5.81 MIL/uL   Hemoglobin 13.6 13.0 - 17.0 g/dL   HCT 42.6 39.0 - 52.0 %   MCV 91.4 78.0 - 100.0 fL   MCH 29.2 26.0 - 34.0 pg   MCHC 31.9 30.0 - 36.0 g/dL   RDW 19.2 (H) 11.5 - 15.5 %   Platelets 161 150 - 400 K/uL   Neutrophils Relative % 68 %   Neutro Abs 4.5 1.7 - 7.7 K/uL   Lymphocytes Relative 19 %   Lymphs Abs 1.2 0.7 - 4.0 K/uL   Monocytes Relative 10 %   Monocytes Absolute 0.6 0.1 - 1.0 K/uL   Eosinophils Relative 2 %   Eosinophils  Absolute 0.1 0.0 - 0.7 K/uL   Basophils Relative 1 %  Basophils Absolute 0.1 0.0 - 0.1 K/uL  Protime-INR     Status: Abnormal   Collection Time: 05/05/15  9:42 AM  Result Value Ref Range   Prothrombin Time 21.9 (H) 11.6 - 15.2 seconds   INR 2.00 (H) 0.00 - 1.49    Imaging: No results found.  Assessment/Plan 1. Proteinuria -patient's labs and vitals have been reviewed.  His INR is 2.0. -will proceed with BMBX today as requested -Risks and Benefits discussed with the patient including, but not limited to bleeding, infection, damage to adjacent structures or low yield requiring additional tests. All of the patient's questions were answered, patient is agreeable to proceed. Consent signed and in chart.    Thank you for this interesting consult.  I greatly enjoyed meeting William Flores and look forward to participating in their care.  A copy of this report was sent to the requesting provider on this date.  Electronically Signed: Henreitta Cea 05/05/2015, 10:52 AM   I spent a total of  30 Minutes   in face to face in clinical consultation, greater than 50% of which was counseling/coordinating care for proteinuria, BMBX

## 2015-05-05 NOTE — Procedures (Signed)
Interventional Radiology Procedure Note  Procedure: CT guided aspirate and core biopsy of right iliac bone Complications: None Recommendations: - Bedrest supine x 1 hrs - Follow biopsy results  Raye Wiens T. Sloka Volante, M.D Pager:  [phone]   

## 2015-05-05 NOTE — Discharge Instructions (Signed)
Bone Marrow Aspiration and Bone Marrow Biopsy °Bone marrow aspiration and bone marrow biopsy are procedures that are done to diagnose blood disorders. You may also have one of these procedures to help diagnose infections or some types of cancer. °Bone marrow is the soft tissue that is inside your bones. Blood cells are produced in bone marrow. For bone marrow aspiration, a sample of tissue in liquid form is removed from inside your bone. For a bone marrow biopsy, a small core of bone marrow tissue is removed. Then these samples are examined under a microscope or tested in a lab. °You may need these procedures if you have an abnormal complete blood count (CBC). The aspiration or biopsy sample is usually taken from the top of your hip bone. Sometimes, an aspiration sample is taken from your chest bone (sternum). °LET YOUR HEALTH CARE PROVIDER KNOW ABOUT: °· Any allergies you have. °· All medicines you are taking, including vitamins, herbs, eye drops, creams, and over-the-counter medicines. °· Previous problems you or members of your family have had with the use of anesthetics. °· Any blood disorders you have. °· Previous surgeries you have had. °· Any medical conditions you may have. °· Whether you are pregnant or you think that you may be pregnant. °RISKS AND COMPLICATIONS °Generally, this is a safe procedure. However, problems may occur, including: °· Infection. °· Bleeding. °BEFORE THE PROCEDURE °· Ask your health care provider about: °¨ Changing or stopping your regular medicines. This is especially important if you are taking diabetes medicines or blood thinners. °¨ Taking medicines such as aspirin and ibuprofen. These medicines can thin your blood. Do not take these medicines before your procedure if your health care provider instructs you not to. °· Plan to have someone take you home after the procedure. °· If you go home right after the procedure, plan to have someone with you for 24 hours. °PROCEDURE  °· An  IV tube may be inserted into one of your veins. °· The injection site will be cleaned with a germ-killing solution (antiseptic). °· You will be given one or more of the following: °¨ A medicine that helps you relax (sedative). °¨ A medicine that numbs the area (local anesthetic). °· The bone marrow sample will be removed as follows: °¨ For an aspiration, a hollow needle will be inserted through your skin and into your bone. Bone marrow fluid will be drawn up into a syringe. °¨ For a biopsy, your health care provider will use a hollow needle to remove a core of tissue from your bone marrow. °· The needle will be removed. °· A bandage (dressing) will be placed over the insertion site and taped in place. °The procedure may vary among health care providers and hospitals. °AFTER THE PROCEDURE °· Your blood pressure, heart rate, breathing rate, and blood oxygen level will be monitored often until the medicines you were given have worn off. °· Return to your normal activities as directed by your health care provider. °  °This information is not intended to replace advice given to you by your health care provider. Make sure you discuss any questions you have with your health care provider. °  °Document Released: 12/24/2003 Document Revised: 05/06/2014 Document Reviewed: 12/11/2013 °Elsevier Interactive Patient Education ©2016 Elsevier Inc. °Bone Marrow Aspiration and Bone Marrow Biopsy, Care After °Refer to this sheet in the next few weeks. These instructions provide you with information about caring for yourself after your procedure. Your health care provider may also give you   more specific instructions. Your treatment has been planned according to current medical practices, but problems sometimes occur. Call your health care provider if you have any problems or questions after your procedure. WHAT TO EXPECT AFTER THE PROCEDURE After your procedure, it is common to have:  Soreness or tenderness around the puncture  site.  Bruising. HOME CARE INSTRUCTIONS  Take medicines only as directed by your health care provider.  Follow your health care provider's instructions about:  Puncture site care.  Bandage (dressing) changes and removal.  Bathe and shower as directed by your health care provider.  Check your puncture site every day for signs of infection. Watch for:  Redness, swelling, or pain.  Fluid, blood, or pus.  Return to your normal activities as directed by your health care provider.  Keep all follow-up visits as directed by your health care provider. This is important. SEEK MEDICAL CARE IF:  You have a fever.  You have uncontrollable bleeding.  You have redness, swelling, or pain at the site of your puncture.  You have fluid, blood, or pus coming from your puncture site.   This information is not intended to replace advice given to you by your health care provider. Make sure you discuss any questions you have with your health care provider.   Document Released: 07/09/2004 Document Revised: 05/06/2014 Document Reviewed: 12/11/2013 Elsevier Interactive Patient Education 2016 Elsevier Inc. Moderate Conscious Sedation, Adult Sedation is the use of medicines to promote relaxation and relieve discomfort and anxiety. Moderate conscious sedation is a type of sedation. Under moderate conscious sedation you are less alert than normal but are still able to respond to instructions or stimulation. Moderate conscious sedation is used during short medical and dental procedures. It is milder than deep sedation or general anesthesia and allows you to return to your regular activities sooner. LET [facility] CARE PROVIDER KNOW ABOUT:   Any allergies you have.  All medicines you are taking, including vitamins, herbs, eye drops, creams, and over-the-counter medicines.  Use of steroids (by mouth or creams).  Previous problems you or members of your family have had with the use of  anesthetics.  Any blood disorders you have.  Previous surgeries you have had.  Medical conditions you have.  Possibility of pregnancy, if this applies.  Use of cigarettes, alcohol, or illegal drugs. RISKS AND COMPLICATIONS Generally, this is a safe procedure. However, as with any procedure, problems can occur. Possible problems include:  Oversedation.  Trouble breathing on your own. You may need to have a breathing tube until you are awake and breathing on your own.  Allergic reaction to any of the medicines used for the procedure. BEFORE THE PROCEDURE  You may have blood tests done. These tests can help show how well your kidneys and liver are working. They can also show how well your blood clots.  A physical exam will be done.  Only take medicines as directed by your health care provider. You may need to stop taking medicines (such as blood thinners, aspirin, or nonsteroidal anti-inflammatory drugs) before the procedure.   Do not eat or drink at least 6 hours before the procedure or as directed by your health care provider.  Arrange for a responsible adult, family member, or friend to take you home after the procedure. He or she should stay with you for at least 24 hours after the procedure, until the medicine has worn off. PROCEDURE   An intravenous (IV) catheter will be inserted into one of your  veins. Medicine will be able to flow directly into your body through this catheter. You may be given medicine through this tube to help prevent pain and help you relax.  The medical or dental procedure will be done. AFTER THE PROCEDURE  You will stay in a recovery area until the medicine has worn off. Your blood pressure and pulse will be checked.   Depending on the procedure you had, you may be allowed to go home when you can tolerate liquids and your pain is under control.   This information is not intended to replace advice given to you by your health care provider. Make  sure you discuss any questions you have with your health care provider.   Document Released: 09/14/2000 Document Revised: 01/10/2014 Document Reviewed: 08/27/2012 Elsevier Interactive Patient Education [employer].

## 2015-05-06 ENCOUNTER — Encounter (HOSPITAL_COMMUNITY): Payer: Self-pay

## 2015-05-06 ENCOUNTER — Encounter (HOSPITAL_COMMUNITY)
Admission: RE | Admit: 2015-05-06 | Discharge: 2015-05-06 | Disposition: A | Discharge status: Home / Self Care | Payer: BLUE CROSS/BLUE SHIELD | Source: Ambulatory Visit | Attending: Nephrology | Admitting: Nephrology

## 2015-05-06 DIAGNOSIS — N183 Chronic kidney disease, stage 3 unspecified: Secondary | ICD-10-CM

## 2015-05-06 HISTORY — DX: Disorder of kidney and ureter, unspecified: N28.9

## 2015-05-06 MED ORDER — FUROSEMIDE 10 MG/ML IJ SOLN
INTRAMUSCULAR | Status: AC
  Administered 2015-05-06: 20 mg via INTRAVENOUS
  Filled 2015-05-06: qty 2

## 2015-05-06 MED ORDER — TECHNETIUM TC 99M MERTIATIDE
15.0000 | Freq: Once | INTRAVENOUS | Status: AC | PRN
  Administered 2015-05-06: 16 via INTRAVENOUS

## 2015-05-15 LAB — CHROMOSOME ANALYSIS, BONE MARROW

## 2015-05-27 ENCOUNTER — Encounter (HOSPITAL_COMMUNITY): Payer: Self-pay | Admitting: Hematology & Oncology

## 2015-05-27 ENCOUNTER — Encounter (INDEPENDENT_AMBULATORY_CARE_PROVIDER_SITE_OTHER): Payer: Self-pay

## 2015-05-27 ENCOUNTER — Encounter (HOSPITAL_COMMUNITY): Payer: BLUE CROSS/BLUE SHIELD | Attending: Hematology & Oncology | Admitting: Hematology & Oncology

## 2015-05-27 VITALS — BP 170/93 | HR 70 | Temp 98.5°F | Resp 16 | Wt 285.7 lb

## 2015-05-27 DIAGNOSIS — E662 Morbid (severe) obesity with alveolar hypoventilation: Secondary | ICD-10-CM | POA: Insufficient documentation

## 2015-05-27 DIAGNOSIS — Z79899 Other long term (current) drug therapy: Secondary | ICD-10-CM | POA: Insufficient documentation

## 2015-05-27 DIAGNOSIS — R809 Proteinuria, unspecified: Secondary | ICD-10-CM | POA: Insufficient documentation

## 2015-05-27 DIAGNOSIS — Z9889 Other specified postprocedural states: Secondary | ICD-10-CM | POA: Insufficient documentation

## 2015-05-27 DIAGNOSIS — E785 Hyperlipidemia, unspecified: Secondary | ICD-10-CM | POA: Insufficient documentation

## 2015-05-27 DIAGNOSIS — Z85038 Personal history of other malignant neoplasm of large intestine: Secondary | ICD-10-CM | POA: Insufficient documentation

## 2015-05-27 DIAGNOSIS — N183 Chronic kidney disease, stage 3 unspecified: Secondary | ICD-10-CM

## 2015-05-27 DIAGNOSIS — D890 Polyclonal hypergammaglobulinemia: Secondary | ICD-10-CM | POA: Insufficient documentation

## 2015-05-27 DIAGNOSIS — E1122 Type 2 diabetes mellitus with diabetic chronic kidney disease: Secondary | ICD-10-CM | POA: Insufficient documentation

## 2015-05-27 DIAGNOSIS — Z87891 Personal history of nicotine dependence: Secondary | ICD-10-CM | POA: Insufficient documentation

## 2015-05-27 DIAGNOSIS — Z7984 Long term (current) use of oral hypoglycemic drugs: Secondary | ICD-10-CM | POA: Insufficient documentation

## 2015-05-27 DIAGNOSIS — D472 Monoclonal gammopathy: Secondary | ICD-10-CM | POA: Diagnosis not present

## 2015-05-27 DIAGNOSIS — I482 Chronic atrial fibrillation: Secondary | ICD-10-CM | POA: Insufficient documentation

## 2015-05-27 DIAGNOSIS — I5032 Chronic diastolic (congestive) heart failure: Secondary | ICD-10-CM | POA: Insufficient documentation

## 2015-05-27 DIAGNOSIS — I2510 Atherosclerotic heart disease of native coronary artery without angina pectoris: Secondary | ICD-10-CM | POA: Insufficient documentation

## 2015-05-27 DIAGNOSIS — I129 Hypertensive chronic kidney disease with stage 1 through stage 4 chronic kidney disease, or unspecified chronic kidney disease: Secondary | ICD-10-CM | POA: Insufficient documentation

## 2015-05-27 DIAGNOSIS — Z86010 Personal history of colonic polyps: Secondary | ICD-10-CM | POA: Insufficient documentation

## 2015-05-27 NOTE — Patient Instructions (Signed)
[place] at [facility] Discharge Instructions  RECOMMENDATIONS MADE BY THE CONSULTANT AND ANY TEST RESULTS WILL BE SENT TO YOUR REFERRING PHYSICIAN.  Return in 4 months for labs and then to see Dr. Whitney Muse  Diagnosis of MGUS  Thank you for choosing [place] at [facility] to provide your oncology and hematology care.  To afford each patient quality time with our provider, please arrive at least 15 minutes before your scheduled appointment time.   Beginning January 23rd 2017 lab work for the [name] will be done in the  Main lab at [facility] on 1st floor. If you have a lab appointment with the [facility] please come in thru the  Main Entrance and check in at the main information desk  You need to re-schedule your appointment should you arrive 10 or more minutes late.  We strive to give you quality time with our providers, and arriving late affects you and other patients whose appointments are after yours.  Also, if you no show three or more times for appointments you may be dismissed from the clinic at the providers discretion.     Again, thank you for choosing [facility].  Our hope is that these requests will decrease the amount of time that you wait before being seen by our physicians.       _____________________________________________________________  Should you have questions after your visit to [facility], please contact our office at [phone] between the hours of 8:30 a.m. and 4:30 p.m.  Voicemails left after 4:30 p.m. will not be returned until the following business day.  For prescription refill requests, have your pharmacy contact our office.         Resources For Cancer Patients and their Caregivers ? American Cancer Society: Can assist with transportation, wigs, general needs, runs Look Good Feel Better.        229-869-6316 ? Cancer Care: Provides financial assistance, online  support groups, medication/co-pay assistance.  1-800-813-HOPE 912-363-4856) ? [facility] Assists [place] cancer patients and their families through emotional , educational and financial support.  214-640-3542 ? [place] DSS Where to apply for food stamps, Medicaid and utility assistance. (920)770-5441 ? [facility]: Transportation to medical appointments. 661-800-8381 ? Social Security Administration: May apply for disability if have a Stage IV cancer. 385 285 9963 219-818-0424 ? [employer], Disability and Transit Services: Assists with nutrition, care and transit needs. [place] Support Programs: @10RELATIVEDAYS @ > Cancer Support Group  2nd Tuesday of the month 1pm-2pm, Journey Room  > Creative Journey  3rd Tuesday of the month 1130am-1pm, Journey Room  > Look Good Feel Better  1st Wednesday of the month 10am-12 noon, Journey Room (Call [place] to register [phone])

## 2015-05-27 NOTE — Progress Notes (Signed)
[facility]  NOTE  Patient Care Team: Timmothy Euler, MD as PCP - General (Family Medicine) Lorretta Harp, MD as Consulting Physician (Cardiology)  CHIEF COMPLAINTS/PURPOSE OF CONSULTATION:  24 hr urine with no M spike Proteinuria Serum protein electrophoresis with M spike 0.6 g/dl, IgG monoclonal protein with kappa light chain specificity POLYclonal gammopathy: IgA, kappa and lamdba typing appear increased  Stage III CKD History of Colon Cancer   HISTORY OF PRESENTING ILLNESS:  William Flores 65 y.o. male is here for follow-up of monoclonal M-spike and CKD.   William Flores is unaccompanied. I personally reviewed and went over bone marrow biopsy results with the patient at length. We discussed new MGUS diagnosis, risks, and planned follow up schedule.   He was recently prescribed Losartan.  Patient notes he received a kidney test where one kidney was found to be larger than the other. He will visit his nephrologist in 4 months.  Reports his bone marrow biopsy site is well healed, though still a little sore if he moves a certain way. Notes he was not completely numb when the biopsy was performed and it felt similarly to a bee sting.  He is doing well. Describes his appetite as "too good". Denies abdominal pain or any issues with his umbilical hernia.   He is scheduled to follow up with his PCP tomorrow.    MEDICAL HISTORY:  Past Medical History  Diagnosis Date  . Diabetes ([place])   . Colon cancer ([place])   . Hypertension   . Sleep apnea   . Chronic atrial fibrillation ([place])   . Hyperlipidemia   . Chronic diastolic heart failure ([facility])     HFPF - EF ~65-70% (OFTEN EXACERBATED BY AFIB)  . Colon polyps 09/21/2012    Tubular adenoma  . Obesity hypoventilation syndrome ([place])   . History of echocardiogram     Echo 05/16/14:  mild LVH, vigorous LVF, EF 65-70%, no RWMA, mean AV gradient 9 mmHg, MAC, mild MR, mod LAE, mild RVE, mild reduced  RVF, severe RAE  . Cardiomyopathy ([place])     EF 35-40% in 2/16 in setting of AF with RVR >> echo 5/16 with improved LVF with EF 65-70%  . CAD (coronary artery disease)     LHC 2/08:  mRCA 50%, o/w no sig CAD, EF 25%  . CKD (chronic kidney disease)     hx of worsening renal failure and hyperK+ in setting of acute diast CHF >> required CVVHD in 4/16 and dialysis x 1 in 5/16  . Renal insufficiency     SURGICAL HISTORY: Past Surgical History  Procedure Laterality Date  . Colon resection    . Circumcision    . Colonoscopy N/A 09/21/2012    Procedure: COLONOSCOPY;  Surgeon: Inda Castle, MD;  Location: [facility];  Service: Endoscopy;  Laterality: N/A;  . US echocardiography  03/04/2010    abnormal study    SOCIAL HISTORY: Social History   Social History  . Marital Status: Divorced    Spouse Name: N/A  . Number of Children: 1  . 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 Smoking status: Former Smoker    Quit date: 08/06/1983  . Smokeless tobacco: Never Used  . Alcohol Use: No  . Drug Use: No  . Sexual Activity: Not on file   Other Topics Concern  . Not on file  Social History Narrative  Divorced 1 children-5 great grandchildren-1 great grandchild Hobbies-hunt and fish (last didn't get to a lot b/c leg hurts) hasn't smoked in 25-30 years No problems with ETOH Work-[employer] Mother died at 44 or 62. Died Was on Dialysis, took one of her legs fell out of her wheelchair and got worse after that. Father died at 20. Heart Attack. Had heart trouble. No brothers or sisters FAMILY HISTORY: Family History  Problem Relation Age of Onset  . Breast cancer Mother   . Diabetes Mother   . Heart attack Father     ALLERGIES:  is allergic to codeine.  MEDICATIONS:  Current Outpatient Prescriptions  Medication Sig Dispense Refill  . allopurinol (ZYLOPRIM) 100 MG tablet TAKE 1 TABLET (100 MG TOTAL) BY MOUTH  DAILY. 30 tablet 3  . atorvastatin (LIPITOR) 20 MG tablet TAKE 1 TABLET (20 MG TOTAL) BY MOUTH DAILY AT 6 PM. 30 tablet 5  . azelastine (OPTIVAR) 0.05 % ophthalmic solution Place 2 drops into both eyes 2 (two) times daily. Reported on 01/06/2015    . diltiazem (CARDIZEM CD) 180 MG 24 hr capsule TAKE 1 CAPSULE EVERY DAY 30 capsule 5  . ferrous sulfate 325 (65 FE) MG EC tablet Take 325 mg by mouth 2 (two) times daily.    . furosemide (LASIX) 80 MG tablet Pt takes 80 mg in the AM and 40 mg at PM as needed 45 tablet 3  . glucose blood test strip Use to check BG daily 100 each 2  . KLOR-CON M20 20 MEQ tablet TAKE 1/2 TABLET BY MOUTH DAILY 30 tablet 5  . metoprolol (LOPRESSOR) 50 MG tablet Take 1 tablet (50 mg total) by mouth 2 (two) times daily. 180 tablet 1  . omeprazole (PRILOSEC) 20 MG capsule TAKE 1 CAPSULE (20 MG TOTAL) BY MOUTH DAILY. 30 capsule 4  . polyvinyl alcohol (LIQUIFILM TEARS) 1.4 % ophthalmic solution Place 1 drop into both eyes as needed for dry eyes. 15 mL 0  . potassium chloride SA (K-DUR,KLOR-CON) 20 MEQ tablet Take 20 mEq by mouth.    . sitaGLIPtin-metformin (JANUMET) 50-1000 MG per tablet Take 1 tablet by mouth 2 (two) times daily with a meal. Patient states he has been getting samples.    Marland Kitchen trimethoprim-polymyxin b (POLYTRIM) ophthalmic solution Place 2 drops into both eyes every 6 (six) hours. 10 mL 0  . warfarin (COUMADIN) 5 MG tablet TAKE 1 TO 2 TABLETS DAILY AS DIRECTED BY ANTICOAGULATION CLINIC 180 tablet 0  . [DISCONTINUED] diltiazem (TIAZAC) 240 MG 24 hr capsule TAKE 1 CAPSULE DAILY 30 capsule 3   No current facility-administered medications for this visit.    Review of Systems  Constitutional: Negative.   HENT: Negative.   Eyes: Negative.   Respiratory: Negative. Cardiovascular: Negative.   Gastrointestinal: Negative.   Genitourinary: Negative.   Musculoskeletal: Negative.   Skin: Negative.   Neurological: Negative.   Endo/Heme/Allergies: Negative.     Psychiatric/Behavioral: Negative.   All other systems reviewed and are negative.  14 point ROS was done and is otherwise as detailed above or in HPI   PHYSICAL EXAMINATION: ECOG PERFORMANCE STATUS: 1 - Symptomatic but completely ambulatory  Filed Vitals:   05/27/15 0930  BP: 170/93  Pulse: 70  Temp: 98.5 F (36.9 C)  Resp: 16   Filed Weights   05/27/15 0930  Weight: 285 lb 11.2 oz (129.593 kg)     Physical Exam  Constitutional: He is oriented to person, place, and time and well-developed,  well-nourished, and in no distress. No distress.  HENT:  Head: Normocephalic and atraumatic.  Mouth/Throat: Oropharynx is clear and moist. No oropharyngeal exudate.  Eyes: Conjunctivae and EOM are normal. Pupils are equal, round, and reactive to light. Right eye exhibits no discharge. Left eye exhibits no discharge. No scleral icterus.  Neck: Normal range of motion. Neck supple. No tracheal deviation present. No thyromegaly present.  Cardiovascular: Normal rate, regular rhythm and normal heart sounds.  Exam reveals no gallop.   No murmur heard. Pulmonary/Chest: Effort normal and breath sounds normal.  Abdominal: Soft. Bowel sounds are normal. He exhibits no mass. There is no tenderness. There is no rebound and no guarding.  Umbilical Hernia, Obese  Musculoskeletal: Normal range of motion.  Neurological: He is alert and oriented to person, place, and time. No cranial nerve deficit. Gait normal.  Skin: Skin is warm and dry. No rash noted. He is not diaphoretic. No erythema.  Psychiatric: Mood, memory, affect and judgment normal.  Nursing note and vitals reviewed.   LABORATORY DATA:  I have reviewed the data as listed  CBC    Component Value Date/Time   WBC 6.5 05/05/2015 0942   WBC 6.6 11/04/2014 0951   WBC 6.1 06/04/2014 1313   RBC 4.66 05/05/2015 0942   RBC 5.20 11/04/2014 0951   RBC 4.07* 06/04/2014 1313   HGB 13.6 05/05/2015 0942   HGB 10.4* 06/04/2014 1313   HCT 42.6  05/05/2015 0942   HCT 44.6 11/04/2014 0951   HCT 34.8* 06/04/2014 1313   PLT 161 05/05/2015 0942   PLT 171 11/04/2014 0951   MCV 91.4 05/05/2015 0942   MCV 86 11/04/2014 0951   MCV 85.6 06/04/2014 1313   MCH 29.2 05/05/2015 0942   MCH 26.7 11/04/2014 0951   MCH 25.6* 06/04/2014 1313   MCHC 31.9 05/05/2015 0942   MCHC 31.2* 11/04/2014 0951   MCHC 29.9* 06/04/2014 1313   RDW 19.2* 05/05/2015 0942   RDW 19.7* 11/04/2014 0951   LYMPHSABS 1.2 05/05/2015 0942   MONOABS 0.6 05/05/2015 0942   EOSABS 0.1 05/05/2015 0942   BASOSABS 0.1 05/05/2015 0942    CMP     Component Value Date/Time   NA 137 03/31/2015 1543   NA 144 02/11/2015 1411   K 4.2 03/31/2015 1543   CL 102 03/31/2015 1543   CO2 24 03/31/2015 1543   GLUCOSE 110* 03/31/2015 1543   GLUCOSE 95 02/11/2015 1411   BUN 54* 03/31/2015 1543   BUN 33* 02/11/2015 1411   CREATININE 2.56* 03/31/2015 1543   CREATININE 1.12 05/25/2012 1556   CALCIUM 8.6* 03/31/2015 1543   PROT 7.9 03/31/2015 1543   PROT 6.8 09/25/2014 0924   ALBUMIN 3.8 03/31/2015 1543   ALBUMIN 3.7 02/11/2015 1411   AST 37 03/31/2015 1543   ALT 23 03/31/2015 1543   ALKPHOS 55 03/31/2015 1543   BILITOT 0.7 03/31/2015 1543   BILITOT 0.5 09/25/2014 0924   GFRNONAA 25* 03/31/2015 1543   GFRNONAA 71 05/25/2012 1556   GFRAA 29* 03/31/2015 1543   GFRAA 82 05/25/2012 1556           RADIOGRAPHIC STUDIES: I have personally reviewed the radiological images as listed and agreed with the findings in the report. CLINICAL DATA: Chronic kidney disease. Colon cancer.  EXAM: METASTATIC BONE SURVEY  COMPARISON: None.  FINDINGS: Diffuse degenerative disc and facet disease throughout the cervical spine. Degenerative disc disease in the thoracic and lumbar spine. Degenerative changes within the shoulders, hips and knees. No focal lytic  or sclerotic bone lesion. No acute bony abnormality.  Cardiomegaly with vascular congestion. No confluent  airspace opacities or effusions.  IMPRESSION: No focal bony abnormality or acute bony abnormality. Degenerative changes as above.  Cardiomegaly, vascular congestion.   Electronically Signed  By: Rolm Baptise M.D.  On: 03/31/2015 16:49  ASSESSMENT & PLAN:  24 hr urine with no M spike Proteinuria Serum protein electrophoresis with M spike 0.6 g/dl, IgG monoclonal protein with kappa light chain specificity POLYclonal gammopathy: IgA, kappa and lamdba typing appear increased  Stage III CKD History of Colon Cancer Metastatic Bone Survey with no focal bony abnormality or acute bony abnormality BMBX with 6% plasma cells, polyclonal staining with slight kappa chain excess  We discussed his BMBX results. We discussed MGUS.  I have recommended a follow-up in 4 months with repeat labs as detailed below.  We discussed a long term follow-up schedule based upon his labs moving forward. He was given reading information on MGUS.   He will return for follow up in 4 months with labs.  Orders Placed This Encounter  Procedures  . CBC with Differential    Standing Status: Future     Number of Occurrences:      Standing Expiration Date: 05/26/2016  . Comprehensive metabolic panel    Standing Status: Future     Number of Occurrences:      Standing Expiration Date: 05/26/2016  . Immunofixation electrophoresis    Standing Status: Future     Number of Occurrences:      Standing Expiration Date: 05/26/2016  . Protein electrophoresis, serum    Standing Status: Future     Number of Occurrences:      Standing Expiration Date: 05/26/2016  . Kappa/lambda light chains    Standing Status: Future     Number of Occurrences:      Standing Expiration Date: 05/26/2016    All questions were answered. The patient knows to call the clinic with any problems, questions or concerns.  This document serves as a record of services personally performed by Ancil Linsey, MD. It was created on her behalf by  Arlyce Harman, a trained medical scribe. The creation of this record is based on the scribe's personal observations and the provider's statements to them. This document has been checked and approved by the attending provider.  I have reviewed the above documentation for accuracy and completeness, and I agree with the above.  This note was electronically signed.    Molli Hazard, MD  05/27/2015 9:58 AM

## 2015-05-27 NOTE — Addendum Note (Signed)
Addended by: Deno Etienne B on: 05/27/2015 01:10 PM   Modules accepted: Medications

## 2015-05-28 ENCOUNTER — Encounter (HOSPITAL_COMMUNITY): Payer: Self-pay

## 2015-05-28 ENCOUNTER — Ambulatory Visit (INDEPENDENT_AMBULATORY_CARE_PROVIDER_SITE_OTHER): Payer: BLUE CROSS/BLUE SHIELD | Admitting: Family Medicine

## 2015-05-28 ENCOUNTER — Encounter: Payer: Self-pay | Admitting: Family Medicine

## 2015-05-28 VITALS — BP 159/80 | HR 71 | Temp 97.9°F | Ht 64.0 in | Wt 288.8 lb

## 2015-05-28 DIAGNOSIS — E1165 Type 2 diabetes mellitus with hyperglycemia: Secondary | ICD-10-CM

## 2015-05-28 DIAGNOSIS — I4891 Unspecified atrial fibrillation: Secondary | ICD-10-CM

## 2015-05-28 DIAGNOSIS — E1122 Type 2 diabetes mellitus with diabetic chronic kidney disease: Secondary | ICD-10-CM | POA: Diagnosis not present

## 2015-05-28 DIAGNOSIS — I10 Essential (primary) hypertension: Secondary | ICD-10-CM | POA: Diagnosis not present

## 2015-05-28 DIAGNOSIS — N183 Chronic kidney disease, stage 3 (moderate): Secondary | ICD-10-CM | POA: Diagnosis not present

## 2015-05-28 DIAGNOSIS — IMO0002 Reserved for concepts with insufficient information to code with codable children: Secondary | ICD-10-CM

## 2015-05-28 LAB — BAYER DCA HB A1C WAIVED: HB A1C (BAYER DCA - WAIVED): 7.9 % — ABNORMAL HIGH (ref <7.0)

## 2015-05-28 MED ORDER — CANAGLIFLOZIN 100 MG PO TABS: 100.0000 mg | ORAL_TABLET | Freq: Every day | ORAL | Status: DC

## 2015-05-28 NOTE — Patient Instructions (Addendum)
Great to see you!  I am going to talk to William Flores about starting you on a new diabetes medication, it is an injection once a week so she can show you how

## 2015-05-28 NOTE — Progress Notes (Signed)
   HPI  Patient presents today here for follow-up diabetes and hypertension.  Patient was well. Diabetes Good medication compliance, Janumet only No low blood sugars Has seen podiatry for his overgrown toenails.  Hypertension Recently had losartan added by his nephrologist He has had a large range of blood pressures at recent visits, as high as 183 systolic and as low as 97 systolic No chest pain, dyspnea, palpitations, leg edema.  A. fib Note palpitations, taking Coumadin like usual    PMH: Smoking status noted ROS: Per HPI  Objective: BP 159/80 mmHg  Pulse 71  Temp(Src) 97.9 F (36.6 C) (Oral)  Ht 5' 4"  (1.626 m)  Wt 288 lb 12.8 oz (130.999 kg)  BMI 49.55 kg/m2 Gen: NAD, alert, cooperative with exam HEENT: NCAT CV: RRR, good S1/S2, no murmur Resp: CTABL, no wheezes, non-labored Ext: No edema, warm Neuro: Alert and oriented, No gross deficits  Diabetic Foot Exam - Simple   Simple Foot Form  Visual Inspection  No deformities, no ulcerations, no other skin breakdown bilaterally:  Yes  Sensation Testing  Intact to touch and monofilament testing bilaterally:  Yes  Pulse Check  Posterior Tibialis and Dorsalis pulse intact bilaterally:  Yes  Comments  Overgrown toenails throughout, some scaling on the feet as well       Assessment and plan:  # Type 2 diabetes Uncontrolled, A1c 7.9 Given renal disease we will likely have to stop metformin, repeating labs today Will likely change over to Tradjenta instead of Janumet Also consider adding weekly GLP Follow-up with our clinical pharmacist in 1-2 weeks to consider weekly GLP and first injection  I was going to initially start in the car, however after reviewing his chart and seeing his renal function I have discontinued this, the pharmacy has been told and I explained to the patient  # Hypertension Appears slightly labile Monitor closely, continue 25 mg losartan, Lasix, Cardizem for now We'll likely have to  increase losartan in the future Labs  # Atrial fibrillation Rate controlled Last INR was about 3 weeks ago and normal    Orders Placed This Encounter  Procedures  . Bayer DCA Hb A1c Waived  . CMP14+EGFR  . Lipid Panel     Laroy Apple, MD [facility] 05/28/2015, 9:52 AM

## 2015-05-29 ENCOUNTER — Telehealth: Payer: Self-pay | Admitting: Family Medicine

## 2015-05-29 LAB — CMP14+EGFR
ALBUMIN: 4.2 g/dL (ref 3.6–4.8)
ALK PHOS: 89 IU/L (ref 39–117)
ALT: 22 IU/L (ref 0–44)
AST: 24 IU/L (ref 0–40)
Albumin/Globulin Ratio: 1.4 (ref 1.2–2.2)
BILIRUBIN TOTAL: 0.3 mg/dL (ref 0.0–1.2)
BUN / CREAT RATIO: 31 — AB (ref 10–24)
BUN: 38 mg/dL — AB (ref 8–27)
CO2: 21 mmol/L (ref 18–29)
Calcium: 9.6 mg/dL (ref 8.6–10.2)
Chloride: 103 mmol/L (ref 96–106)
Creatinine, Ser: 1.21 mg/dL (ref 0.76–1.27)
GFR calc Af Amer: 73 mL/min/{1.73_m2} (ref >59)
GFR calc non Af Amer: 63 mL/min/{1.73_m2} (ref >59)
GLUCOSE: 236 mg/dL — AB (ref 65–99)
Globulin, Total: 3 g/dL (ref 1.5–4.5)
Potassium: 4.2 mmol/L (ref 3.5–5.2)
SODIUM: 145 mmol/L — AB (ref 134–144)
Total Protein: 7.2 g/dL (ref 6.0–8.5)

## 2015-05-29 LAB — LIPID PANEL
CHOL/HDL RATIO: 6 ratio — AB (ref 0.0–5.0)
Cholesterol, Total: 102 mg/dL (ref 100–199)
HDL: 17 mg/dL — AB (ref >39)
LDL Calculated: 11 mg/dL (ref 0–99)
TRIGLYCERIDES: 370 mg/dL — AB (ref 0–149)
VLDL CHOLESTEROL CAL: 74 mg/dL — AB (ref 5–40)

## 2015-05-29 NOTE — Telephone Encounter (Signed)
Patient aware of results.

## 2015-05-30 ENCOUNTER — Other Ambulatory Visit: Payer: Self-pay | Admitting: Family Medicine

## 2015-06-06 ENCOUNTER — Other Ambulatory Visit: Payer: Self-pay | Admitting: Pharmacist

## 2015-06-06 ENCOUNTER — Other Ambulatory Visit: Payer: Self-pay | Admitting: Family Medicine

## 2015-06-09 ENCOUNTER — Other Ambulatory Visit: Payer: Self-pay | Admitting: Cardiology

## 2015-06-09 NOTE — Telephone Encounter (Signed)
Rx(s) sent to pharmacy electronically.  

## 2015-06-29 ENCOUNTER — Ambulatory Visit: Payer: Self-pay | Admitting: Pharmacist

## 2015-07-06 ENCOUNTER — Other Ambulatory Visit: Payer: Self-pay | Admitting: Cardiology

## 2015-07-13 ENCOUNTER — Ambulatory Visit (INDEPENDENT_AMBULATORY_CARE_PROVIDER_SITE_OTHER): Payer: BLUE CROSS/BLUE SHIELD | Admitting: Pharmacist

## 2015-07-13 ENCOUNTER — Encounter: Payer: Self-pay | Admitting: Pharmacist

## 2015-07-13 VITALS — BP 140/82 | HR 75 | Ht 64.0 in | Wt 290.5 lb

## 2015-07-13 DIAGNOSIS — E1122 Type 2 diabetes mellitus with diabetic chronic kidney disease: Secondary | ICD-10-CM | POA: Diagnosis not present

## 2015-07-13 DIAGNOSIS — IMO0002 Reserved for concepts with insufficient information to code with codable children: Secondary | ICD-10-CM

## 2015-07-13 DIAGNOSIS — E1165 Type 2 diabetes mellitus with hyperglycemia: Secondary | ICD-10-CM

## 2015-07-13 DIAGNOSIS — I4891 Unspecified atrial fibrillation: Secondary | ICD-10-CM

## 2015-07-13 DIAGNOSIS — N183 Chronic kidney disease, stage 3 (moderate): Secondary | ICD-10-CM | POA: Diagnosis not present

## 2015-07-13 LAB — BMP8+EGFR
BUN/Creatinine Ratio: 24 (ref 10–24)
BUN: 36 mg/dL — ABNORMAL HIGH (ref 8–27)
CO2: 21 mmol/L (ref 18–29)
CREATININE: 1.5 mg/dL — AB (ref 0.76–1.27)
Calcium: 9.4 mg/dL (ref 8.6–10.2)
Chloride: 101 mmol/L (ref 96–106)
GFR calc Af Amer: 56 mL/min/{1.73_m2} — ABNORMAL LOW (ref >59)
GFR calc non Af Amer: 49 mL/min/{1.73_m2} — ABNORMAL LOW (ref >59)
GLUCOSE: 270 mg/dL — AB (ref 65–99)
Potassium: 3.9 mmol/L (ref 3.5–5.2)
SODIUM: 142 mmol/L (ref 134–144)

## 2015-07-13 LAB — COAGUCHEK XS/INR WAIVED
INR: 2.2 — ABNORMAL HIGH (ref 0.9–1.1)
Prothrombin Time: 26.5 s

## 2015-07-13 NOTE — Progress Notes (Signed)
Subjective:    William Flores is a 65 y.o. male who presents for a followup evaluation of Type 2 diabetes mellitus and to check protime.    Known diabetic complications: nephropathy, peripheral neuropathy and cardiovascular disease  Patient sees nephrologist and cardiologist regularly Current diabetic medications include Janumet 50/1000mg bid.   Eye exam current (within one year): yes Weight trend: stable Current diet: diabetic - tried to limit CHO intake but does admits to eating too much / large portions. Current exercise: none  Current monitoring regimen: home blood tests - 1-2 times daily Home blood sugar records: ranges from 130 to 180 Any episodes of hypoglycemia? no  Is He on ACE inhibitor or angiotensin II receptor blocker?  Yes - take losartan 39m daily    Objective:    BP 140/82 mmHg  Pulse 75  Ht 5' 4"  (1.626 m)  Wt 290 lb 8 oz (131.77 kg)  BMI 49.84 kg/m2   Last A1c = 7.9% INR was 2.2 today  Lab Review GLUCOSE (mg/dL)  Date Value  07/13/2015 270*  05/28/2015 236*  02/11/2015 95   GLUCOSE, BLD (mg/dL)  Date Value  03/31/2015 110*  05/30/2014 94  05/21/2014 158*   CO2 (mmol/L)  Date Value  07/13/2015 21  05/28/2015 21  03/31/2015 24   BUN (mg/dL)  Date Value  07/13/2015 36*  05/28/2015 38*  03/31/2015 54*  02/11/2015 33*  05/30/2014 19  05/21/2014 23*   CREAT (mg/dL)  Date Value  05/25/2012 1.12   CREATININE, SER (mg/dL)  Date Value  07/13/2015 1.50*  05/28/2015 1.21  03/31/2015 2.56*    Assessment:    Diabetes Mellitus type II, under fair control.   Therapeutic anticoagulation   Plan:    1.  Rx changes: none 2.  Education: Reviewed 'ABCs' of diabetes management (respective goals in parentheses):  A1C (<7), blood pressure (<130/80), and cholesterol (LDL <100). 3. CHO counting diet discussed.  Reviewed CHO amount in various foods and how to read nutrition labels.  Discussed recommended serving sizes.  4.  Orders Placed This  Encounter  Procedures  . BMP8+EGFR    5.   Anticoagulation Dose Instructions as of 07/13/2015      SDorene GrebeTue Wed Thu Fri Sat   New Dose 7.5 mg 7.5 mg 7.5 mg 7.5 mg 7.5 mg 7.5 mg 7.5 mg    Description        Continue current dose of warfarin 569m- take 1 and 1/2 tablets daily.          Follow up: 4 weeks

## 2015-07-13 NOTE — Patient Instructions (Signed)
Anticoagulation Dose Instructions as of 07/13/2015      Dorene Grebe Tue Wed Thu Fri Sat   New Dose 7.5 mg 7.5 mg 7.5 mg 7.5 mg 7.5 mg 7.5 mg 7.5 mg    Description        Continue current dose of warfarin 22m - take 1 and 1/2 tablets daily.         INR was 2.2 today   Diabetes and Standards of Medical Care   Diabetes is complicated. You may find that your diabetes team includes a dietitian, nurse, diabetes educator, eye doctor, and more. To help everyone know what is going on and to help you get the care you deserve, the following schedule of care was developed to help keep you on track. Below are the tests, exams, vaccines, medicines, education, and plans you will need.  Blood Glucose Goals Prior to meals = 80 - 130 Within 2 hours of the start of a meal = less than 180  HbA1c test (goal is less than 7.0% - your last value was 7.9%) This test shows how well you have controlled your glucose over the past 2 to 3 months. It is used to see if your diabetes management plan needs to be adjusted.   It is performed at least 2 times a year if you are meeting treatment goals.  It is performed 4 times a year if therapy has changed or if you are not meeting treatment goals.  Blood pressure test  This test is performed at every routine medical visit. The goal is less than 140/90 mmHg for most people, but 130/80 mmHg in some cases. Ask your health care provider about your goal.  Dental exam  Follow up with the dentist regularly.  Eye exam  If you are diagnosed with type 1 diabetes as a child, get an exam upon reaching the age of 184years or older and have had diabetes for 3 to 5 years. Yearly eye exams are recommended after that initial eye exam.  If you are diagnosed with type 1 diabetes as an adult, get an exam within 5 years of diagnosis and then yearly.  If you are diagnosed with type 2 diabetes, get an exam as soon as possible after the diagnosis and then yearly.  Foot care  exam  Visual foot exams are performed at every routine medical visit. The exams check for cuts, injuries, or other problems with the feet.  A comprehensive foot exam should be done yearly. This includes visual inspection as well as assessing foot pulses and testing for loss of sensation.  Check your feet nightly for cuts, injuries, or other problems with your feet. Tell your health care provider if anything is not healing.  Kidney function test (urine microalbumin)  This test is performed once a year.  Type 1 diabetes: The first test is performed 5 years after diagnosis.  Type 2 diabetes: The first test is performed at the time of diagnosis.  A serum creatinine and estimated glomerular filtration rate (eGFR) test is done once a year to assess the level of chronic kidney disease (CKD), if present.  Lipid profile (cholesterol, HDL, LDL, triglycerides)  Performed every 5 years for most people.  The goal for LDL is less than 100 mg/dL. If you are at high risk, the goal is less than 70 mg/dL.  The goal for HDL is 40 mg/dL to 50 mg/dL for men and 50 mg/dL to 60 mg/dL for women. An HDL cholesterol of 60 mg/dL  or higher gives some protection against heart disease.  The goal for triglycerides is less than 150 mg/dL.  Influenza vaccine, pneumococcal vaccine, and hepatitis B vaccine  The influenza vaccine is recommended yearly.  The pneumococcal vaccine is generally given once in a lifetime. However, there are some instances when another vaccination is recommended. Check with your health care provider.  The hepatitis B vaccine is also recommended for adults with diabetes.  Diabetes self-management education  Education is recommended at diagnosis and ongoing as needed.  Treatment plan  Your treatment plan is reviewed at every medical visit.  Document Released: 10/17/2008 Document Revised: 08/22/2012 Document Reviewed: 05/22/2012 [facility] Patient Information 2014 [place].

## 2015-07-14 ENCOUNTER — Telehealth: Payer: Self-pay | Admitting: Family Medicine

## 2015-07-15 NOTE — Telephone Encounter (Signed)
Pt is aware of results. 

## 2015-07-16 ENCOUNTER — Other Ambulatory Visit: Payer: Self-pay | Admitting: Pharmacist

## 2015-07-16 DIAGNOSIS — R7989 Other specified abnormal findings of blood chemistry: Secondary | ICD-10-CM

## 2015-07-20 ENCOUNTER — Other Ambulatory Visit: Payer: BLUE CROSS/BLUE SHIELD

## 2015-07-20 DIAGNOSIS — R7989 Other specified abnormal findings of blood chemistry: Secondary | ICD-10-CM

## 2015-07-21 LAB — BMP8+EGFR
BUN/Creatinine Ratio: 24 (ref 10–24)
BUN: 32 mg/dL — ABNORMAL HIGH (ref 8–27)
CALCIUM: 9.6 mg/dL (ref 8.6–10.2)
CO2: 18 mmol/L (ref 18–29)
CREATININE: 1.33 mg/dL — AB (ref 0.76–1.27)
Chloride: 100 mmol/L (ref 96–106)
GFR calc Af Amer: 65 mL/min/{1.73_m2} (ref >59)
GFR, EST NON AFRICAN AMERICAN: 56 mL/min/{1.73_m2} — AB (ref >59)
GLUCOSE: 188 mg/dL — AB (ref 65–99)
Potassium: 4.2 mmol/L (ref 3.5–5.2)
Sodium: 143 mmol/L (ref 134–144)

## 2015-08-07 ENCOUNTER — Other Ambulatory Visit: Payer: BLUE CROSS/BLUE SHIELD

## 2015-08-17 ENCOUNTER — Ambulatory Visit: Payer: Self-pay | Admitting: Pharmacist

## 2015-08-18 ENCOUNTER — Encounter: Payer: Self-pay | Admitting: Family Medicine

## 2015-08-19 ENCOUNTER — Other Ambulatory Visit: Payer: Self-pay | Admitting: Family Medicine

## 2015-08-20 ENCOUNTER — Other Ambulatory Visit: Payer: Self-pay | Admitting: Family Medicine

## 2015-09-04 ENCOUNTER — Other Ambulatory Visit: Payer: Self-pay | Admitting: Family Medicine

## 2015-09-17 ENCOUNTER — Ambulatory Visit (INDEPENDENT_AMBULATORY_CARE_PROVIDER_SITE_OTHER): Payer: BLUE CROSS/BLUE SHIELD | Admitting: Family Medicine

## 2015-09-17 ENCOUNTER — Encounter: Payer: Self-pay | Admitting: Family Medicine

## 2015-09-17 VITALS — BP 159/80 | HR 73 | Temp 98.5°F | Ht 64.0 in | Wt 302.2 lb

## 2015-09-17 DIAGNOSIS — Z7901 Long term (current) use of anticoagulants: Secondary | ICD-10-CM

## 2015-09-17 DIAGNOSIS — E1165 Type 2 diabetes mellitus with hyperglycemia: Secondary | ICD-10-CM | POA: Diagnosis not present

## 2015-09-17 DIAGNOSIS — N183 Chronic kidney disease, stage 3 unspecified: Secondary | ICD-10-CM | POA: Insufficient documentation

## 2015-09-17 DIAGNOSIS — E1122 Type 2 diabetes mellitus with diabetic chronic kidney disease: Secondary | ICD-10-CM | POA: Diagnosis not present

## 2015-09-17 DIAGNOSIS — E785 Hyperlipidemia, unspecified: Secondary | ICD-10-CM | POA: Diagnosis not present

## 2015-09-17 DIAGNOSIS — Z5181 Encounter for therapeutic drug level monitoring: Secondary | ICD-10-CM

## 2015-09-17 DIAGNOSIS — I4891 Unspecified atrial fibrillation: Secondary | ICD-10-CM

## 2015-09-17 DIAGNOSIS — IMO0002 Reserved for concepts with insufficient information to code with codable children: Secondary | ICD-10-CM

## 2015-09-17 DIAGNOSIS — I10 Essential (primary) hypertension: Secondary | ICD-10-CM | POA: Diagnosis not present

## 2015-09-17 DIAGNOSIS — Z0000 Encounter for general adult medical examination without abnormal findings: Secondary | ICD-10-CM

## 2015-09-17 HISTORY — DX: Type 2 diabetes mellitus with diabetic chronic kidney disease: E11.22

## 2015-09-17 HISTORY — DX: Chronic kidney disease, stage 3 unspecified: N18.30

## 2015-09-17 LAB — BAYER DCA HB A1C WAIVED: HB A1C: 8.7 % — AB (ref <7.0)

## 2015-09-17 LAB — COAGUCHEK XS/INR WAIVED
INR: 2.4 — AB (ref 0.9–1.1)
PROTHROMBIN TIME: 28.5 s

## 2015-09-17 MED ORDER — ATORVASTATIN CALCIUM 20 MG PO TABS: 20.0000 mg | ORAL_TABLET | Freq: Every day | ORAL | 3 refills | Status: DC

## 2015-09-17 MED ORDER — LOSARTAN POTASSIUM 50 MG PO TABS: 50.0000 mg | ORAL_TABLET | Freq: Every day | ORAL | 3 refills | Status: DC

## 2015-09-17 NOTE — Progress Notes (Signed)
   HPI  Patient presents today here for follow-up diabetes, and other chronic medical conditions.  Diabetes Checking blood sugar inconsistently, average postprandial/random check is 175-25. No hypoglycemia No foot numbness. Medication compliance.  Hyperlipidemia Needs refill on Lipitor, he's been out for 2 weeks No medication side effects, no complaints.  Obesity He has liberated his diet over the last month, he states he's gained 10 pounds and very interested to lose it again. He's about the start watching his diet again.  Atrial fibrillation, chronic anticoagulation Good Coumadin compliance, no bleeding.   PMH: Smoking status noted ROS: Per HPI  Objective: BP (!) 170/82   Pulse 73   Temp 98.5 F (36.9 C) (Oral)   Ht 5' 4"  (1.626 m)   Wt (!) 302 lb 3.2 oz (137.1 kg)   BMI 51.87 kg/m  Gen: NAD, alert, cooperative with exam HEENT: NCAT, EOMI, PERRL CV: RRR, good S1/S2, no murmur Resp: CTABL, no wheezes, non-labored Ext: No edema, warm Neuro: Alert and oriented, No gross deficits  Diabetic Foot Exam - Simple   Simple Foot Form Visual Inspection No deformities, no ulcerations, no other skin breakdown bilaterally:  Yes Sensation Testing Intact to touch and monofilament testing bilaterally:  Yes Pulse Check Posterior Tibialis and Dorsalis pulse intact bilaterally:  Yes Comments Very thickened toenails throughout      Assessment and plan:  # Type 2 diabetes Control slipping, A1c is 8.7 today His GFR has been borderline for metformin, repeating CMP today Continue Janumet Consider adding GLP-1 agonist or insulin, or xultophy, as GFR may require stopping metformin in the coming years Recommended appt with clinical pharmacist for discusion and injection teaching  # Hypertension Elevated today Increasing losartan to 50 mg, close attention to renal function, repeat labs in one month for clinical pharmacist appointment, lab was ordered Continue diuretic, beta  blocker, calcium channel blocker Increasing ARB slowly to ensure that his kidneys will tolerate Did not add amlodipine due to already being on Cardizem for rate control, did not increase beta blocker with history of slow ventricular response with A. fib  # Atrial fibrillation, chronic anticoagulation Rate controlled, continue Coumadin at current dose INR is therapeutic  # Chronic kidney disease stage III Repeating CMP  Healthcare maintenance Hepatitis C drawn.  Morbid obesity Patient has had dietary indiscretion lately, he is willing to work on diet Also would consider starting GLP - 1 which  at least weight neutral and may cause some weight loss with improving diabetic control  Hypertriglyceridemia Previous triglyceride elevated to the 300s Likely due to uncontrolled diabetes, for now will not treat but rather focus on improving diabetic control Consider Vascepa   Orders Placed This Encounter  Procedures  . Bayer DCA Hb A1c Waived  . CMP14+EGFR  . LDL Cholesterol, Direct    Meds ordered this encounter  Medications  . atorvastatin (LIPITOR) 20 MG tablet    Sig: Take 1 tablet (20 mg total) by mouth daily at 6 PM. PLEASE CONTACT OFFICE FOR ADDITIONAL REFILLS    Dispense:  90 tablet    Refill:  3    PLEASE ADVISE PATIENT TO CONTACT OFFICE    Laroy Apple, MD [facility] 09/17/2015, 11:08 AM

## 2015-09-17 NOTE — Patient Instructions (Addendum)
Great to see you!  I increased your losartan to 50 mg once daily, you will need to get repeat labs in 1 month when you see the pharmacist to make sure your kidneys can tolerate this.   We will call with your lab results within 1 week  Please See Tammy in 3-4 weeks to consider insulin injections or trulicity  Try to cut back on your carbohydrate intake    Diet Recommendations for Diabetes   Starchy (carb) foods include: Bread, rice, pasta, potatoes, corn, crackers, bagels, muffins, all baked goods.   Protein foods include: Meat, fish, poultry, eggs, dairy foods, and beans such as pinto and kidney beans (beans also provide carbohydrate).   1. Eat at least 3 meals and 1-2 snacks per day. Never go more than 4-5 hours while awake without eating.  2. Limit starchy foods to TWO per meal and ONE per snack. ONE portion of a starchy  food is equal to the following:   - ONE slice of bread (or its equivalent, such as half of a hamburger bun).   - 1/2 cup of a "scoopable" starchy food such as potatoes or rice.   - 1 OUNCE (28 grams) of starchy snack foods such as crackers or pretzels (look on label).   - 15 grams of carbohydrate as shown on food label.  3. Both lunch and dinner should include a protein food, a carb food, and vegetables.   - Obtain twice as many veg's as protein or carbohydrate foods for both lunch and dinner.   - Try to keep frozen veg's on hand for a quick vegetable serving.     - Fresh or frozen veg's are best.  4. Breakfast should always include protein.

## 2015-09-18 LAB — CMP14+EGFR
ALBUMIN: 4.1 g/dL (ref 3.6–4.8)
ALK PHOS: 63 IU/L (ref 39–117)
ALT: 34 IU/L (ref 0–44)
AST: 40 IU/L (ref 0–40)
Albumin/Globulin Ratio: 1.4 (ref 1.2–2.2)
BUN / CREAT RATIO: 20 (ref 10–24)
BUN: 31 mg/dL — AB (ref 8–27)
Bilirubin Total: 0.3 mg/dL (ref 0.0–1.2)
CO2: 20 mmol/L (ref 18–29)
CREATININE: 1.52 mg/dL — AB (ref 0.76–1.27)
Calcium: 9.7 mg/dL (ref 8.6–10.2)
Chloride: 103 mmol/L (ref 96–106)
GFR calc non Af Amer: 48 mL/min/{1.73_m2} — ABNORMAL LOW (ref >59)
GFR, EST AFRICAN AMERICAN: 56 mL/min/{1.73_m2} — AB (ref >59)
GLOBULIN, TOTAL: 3 g/dL (ref 1.5–4.5)
Glucose: 163 mg/dL — ABNORMAL HIGH (ref 65–99)
Potassium: 4.2 mmol/L (ref 3.5–5.2)
SODIUM: 146 mmol/L — AB (ref 134–144)
TOTAL PROTEIN: 7.1 g/dL (ref 6.0–8.5)

## 2015-09-18 LAB — LDL CHOLESTEROL, DIRECT: LDL DIRECT: 68 mg/dL (ref 0–99)

## 2015-09-18 LAB — HEPATITIS C ANTIBODY

## 2015-09-30 ENCOUNTER — Ambulatory Visit (HOSPITAL_COMMUNITY): Payer: BLUE CROSS/BLUE SHIELD | Admitting: Hematology & Oncology

## 2015-09-30 ENCOUNTER — Other Ambulatory Visit (HOSPITAL_COMMUNITY): Payer: BLUE CROSS/BLUE SHIELD

## 2015-10-15 ENCOUNTER — Encounter: Payer: Self-pay | Admitting: Pharmacist

## 2015-10-15 ENCOUNTER — Ambulatory Visit (INDEPENDENT_AMBULATORY_CARE_PROVIDER_SITE_OTHER): Payer: BLUE CROSS/BLUE SHIELD | Admitting: Pharmacist

## 2015-10-15 VITALS — BP 138/70 | HR 74 | Ht 64.0 in | Wt 297.5 lb

## 2015-10-15 DIAGNOSIS — E1122 Type 2 diabetes mellitus with diabetic chronic kidney disease: Secondary | ICD-10-CM | POA: Diagnosis not present

## 2015-10-15 DIAGNOSIS — I4891 Unspecified atrial fibrillation: Secondary | ICD-10-CM | POA: Diagnosis not present

## 2015-10-15 DIAGNOSIS — N183 Chronic kidney disease, stage 3 unspecified: Secondary | ICD-10-CM

## 2015-10-15 DIAGNOSIS — E1165 Type 2 diabetes mellitus with hyperglycemia: Secondary | ICD-10-CM | POA: Diagnosis not present

## 2015-10-15 DIAGNOSIS — Z23 Encounter for immunization: Secondary | ICD-10-CM

## 2015-10-15 DIAGNOSIS — IMO0002 Reserved for concepts with insufficient information to code with codable children: Secondary | ICD-10-CM

## 2015-10-15 LAB — COAGUCHEK XS/INR WAIVED
INR: 2.5 — ABNORMAL HIGH (ref 0.9–1.1)
Prothrombin Time: 30.4 s

## 2015-10-15 MED ORDER — DULAGLUTIDE 0.75 MG/0.5ML ~~LOC~~ SOAJ: 0.7500 mg | SUBCUTANEOUS | 1 refills | Status: DC

## 2015-10-15 NOTE — Progress Notes (Signed)
Subjective:    ADONYS WILDES is a 65 y.o. male who presents for a followup evaluation of Type 2 diabetes mellitus and to check protime.   Mr. Lacomb was last see by Dr Wendi Snipes about 3 weeks ago and A1c has increased from 7.9% to 8.7%.  Weight has also increased and patient admitted to dietary indiscretions that he knew were affecting his weight and BG.   His BP was also elevated and losartan was increase from 11m qd to 565mqd.  Known diabetic complications: nephropathy, peripheral neuropathy and cardiovascular disease  Patient sees nephrologist and cardiologist regularly Current diabetic medications include Janumet 50/1000mg bid.   Eye exam current (within one year): yes Weight trend: increasing steadily Current diet:  No following CHO limiting / coutning diet.  No watching portion sizes or limiting sugar intake as recommended. Current exercise: none  Current monitoring regimen: home blood tests - 1-2 times daily Home blood sugar records: ranges from 130 to 180 Any episodes of hypoglycemia? no  Is He on ACE inhibitor or angiotensin II receptor blocker?  Yes - take losartan 5063maily    Objective:    BP 138/70   Pulse 74   Ht 5' 4"  (1.626 m)   Wt 297 lb 8 oz (134.9 kg)   BMI 51.07 kg/m    Last A1c = 8.7% (09/17/2015) A1c = 7.9% (07/2015)   INR was 2.5 today  Lab Review Glucose (mg/dL)  Date Value  09/17/2015 163 (H)  07/20/2015 188 (H)  07/13/2015 270 (H)   Glucose, Bld (mg/dL)  Date Value  03/31/2015 110 (H)  05/30/2014 94  05/21/2014 158 (H)   CO2 (mmol/L)  Date Value  09/17/2015 20  07/20/2015 18  07/13/2015 21   BUN (mg/dL)  Date Value  09/17/2015 31 (H)  07/20/2015 32 (H)  07/13/2015 36 (H)   Creat (mg/dL)  Date Value  05/25/2012 1.12   Creatinine, Ser (mg/dL)  Date Value  09/17/2015 1.52 (H)  07/20/2015 1.33 (H)  07/13/2015 1.50 (H)    Assessment:    Diabetes Mellitus type II, under inadequate control.   Therapeutic  anticoagulation HTN  - BP has improved with increase in losartan to 25m69m Obesity - weight is down about 2# since last visit about 3 weeks ago   Plan:    1.  Rx changes: start Trulicity 0.756.64QIonce weekly - first injection given in office.  Trulicity is a tier 4 med for patient but with copay assistance card his cost shoudl be $25 / month.  We discussed Victoza instead and that it was cardio benefits but patient wanted to use once weekly injection instead of daily.   Continue losartan 25mg70m  Anticoagulation Dose Instructions as of 10/15/2015      Sun MDorene GrebeWed Thu Fri Sat   New Dose 7.5 mg 7.5 mg 7.5 mg 7.5 mg 7.5 mg 7.5 mg 7.5 mg    Description   Continue current dose of warfarin 5mg -40mke 1 and 1/2 tablets daily.         2.  Education: Reviewed 'ABCs' of diabetes management (respective goals in parentheses):  A1C (<7), blood pressure (<130/80), and cholesterol (LDL <100). 3. CHO counting diet discussed.  Reviewed CHO amount in various foods and how to read nutrition labels.  Discussed recommended serving sizes.  Important to decrease caloris intake.  Trulicity should also help with weight.  Initial weight goal is 285 lbs by end of 2017. 4.  Orders  Placed This Encounter  Procedures  . Flu Vaccine QUAD 36+ mos IM BMET checked today     Follow up 1 month - DM and protime

## 2015-10-16 LAB — BMP8+EGFR
BUN / CREAT RATIO: 29 — AB (ref 10–24)
BUN: 44 mg/dL — ABNORMAL HIGH (ref 8–27)
CALCIUM: 9.6 mg/dL (ref 8.6–10.2)
CHLORIDE: 101 mmol/L (ref 96–106)
CO2: 22 mmol/L (ref 18–29)
Creatinine, Ser: 1.52 mg/dL — ABNORMAL HIGH (ref 0.76–1.27)
GFR calc non Af Amer: 48 mL/min/{1.73_m2} — ABNORMAL LOW (ref >59)
GFR, EST AFRICAN AMERICAN: 56 mL/min/{1.73_m2} — AB (ref >59)
Glucose: 202 mg/dL — ABNORMAL HIGH (ref 65–99)
POTASSIUM: 4.5 mmol/L (ref 3.5–5.2)
SODIUM: 142 mmol/L (ref 134–144)

## 2015-10-31 IMAGING — CR DG ABD PORTABLE 1V
2 series · 2 of 2 positions shown · non-contrast
Comparison: 02/19/2014

CLINICAL DATA: Metabolic acidosis.

EXAM:
PORTABLE ABDOMEN - 1 VIEW

[AP (1 of 2)]
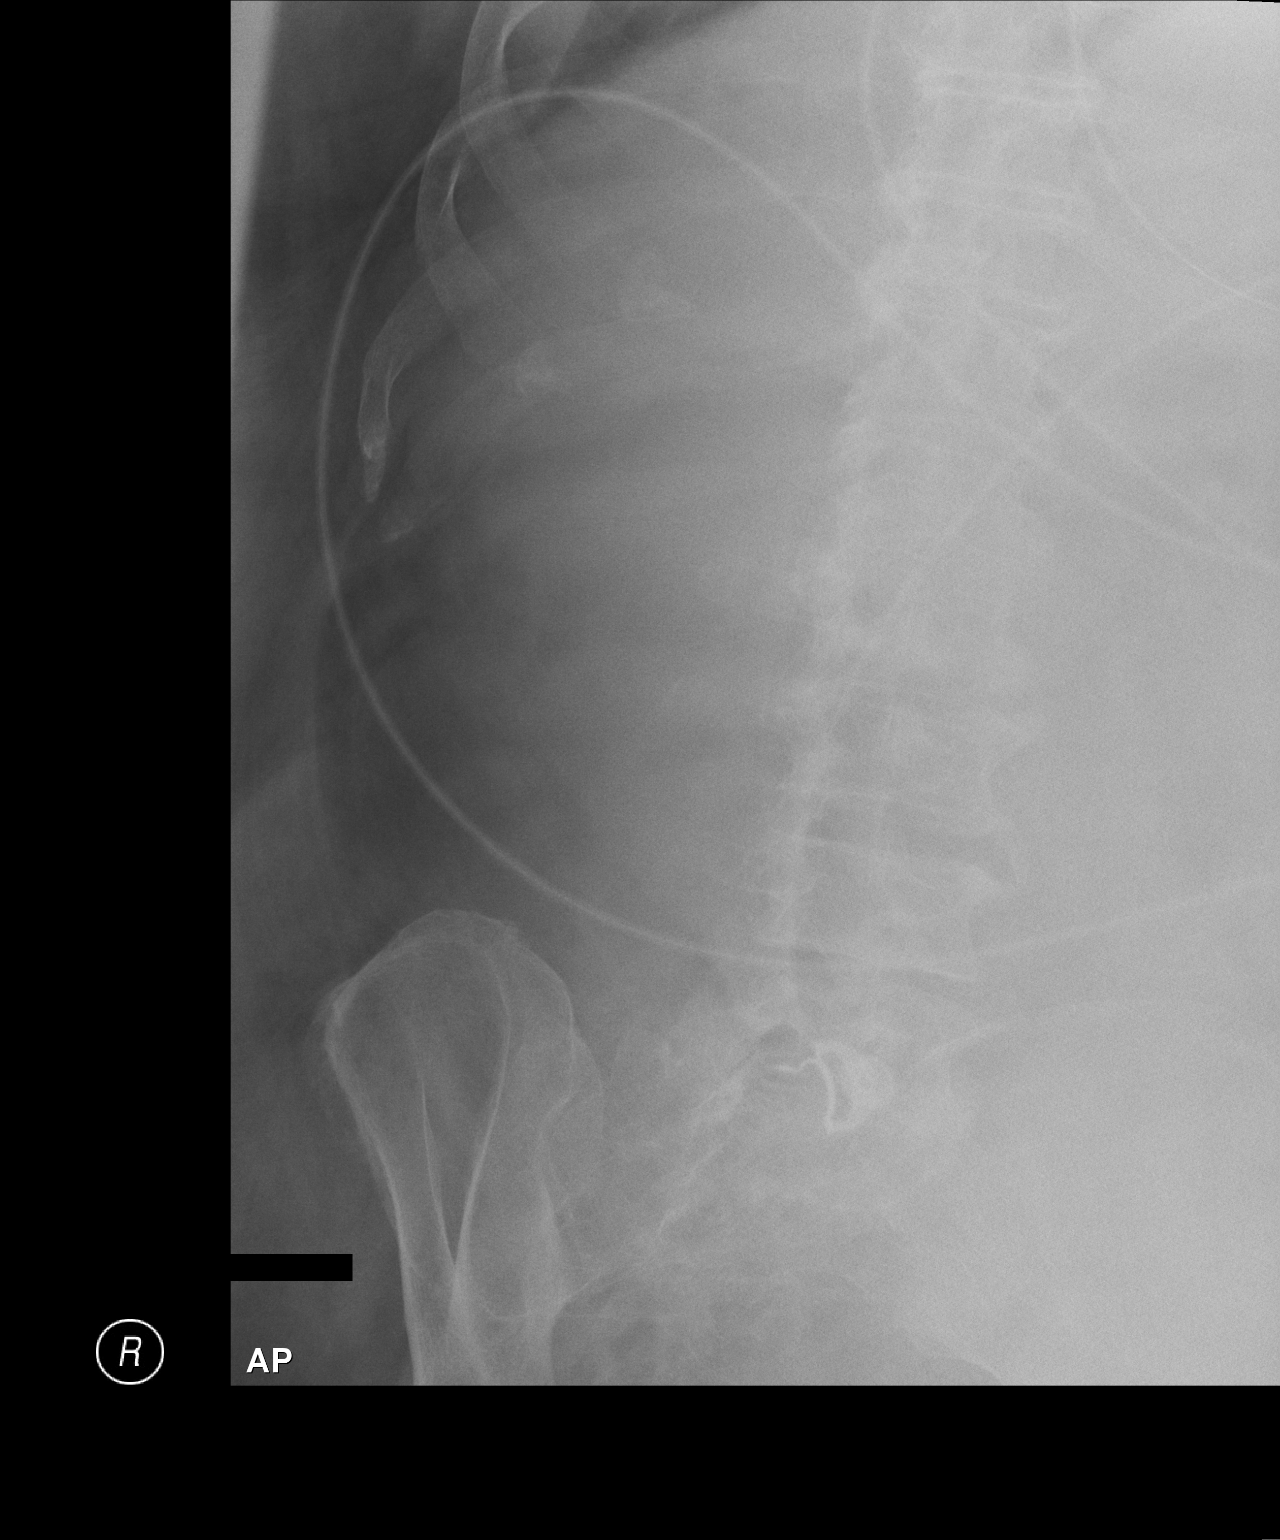

[AP (2 of 2)]
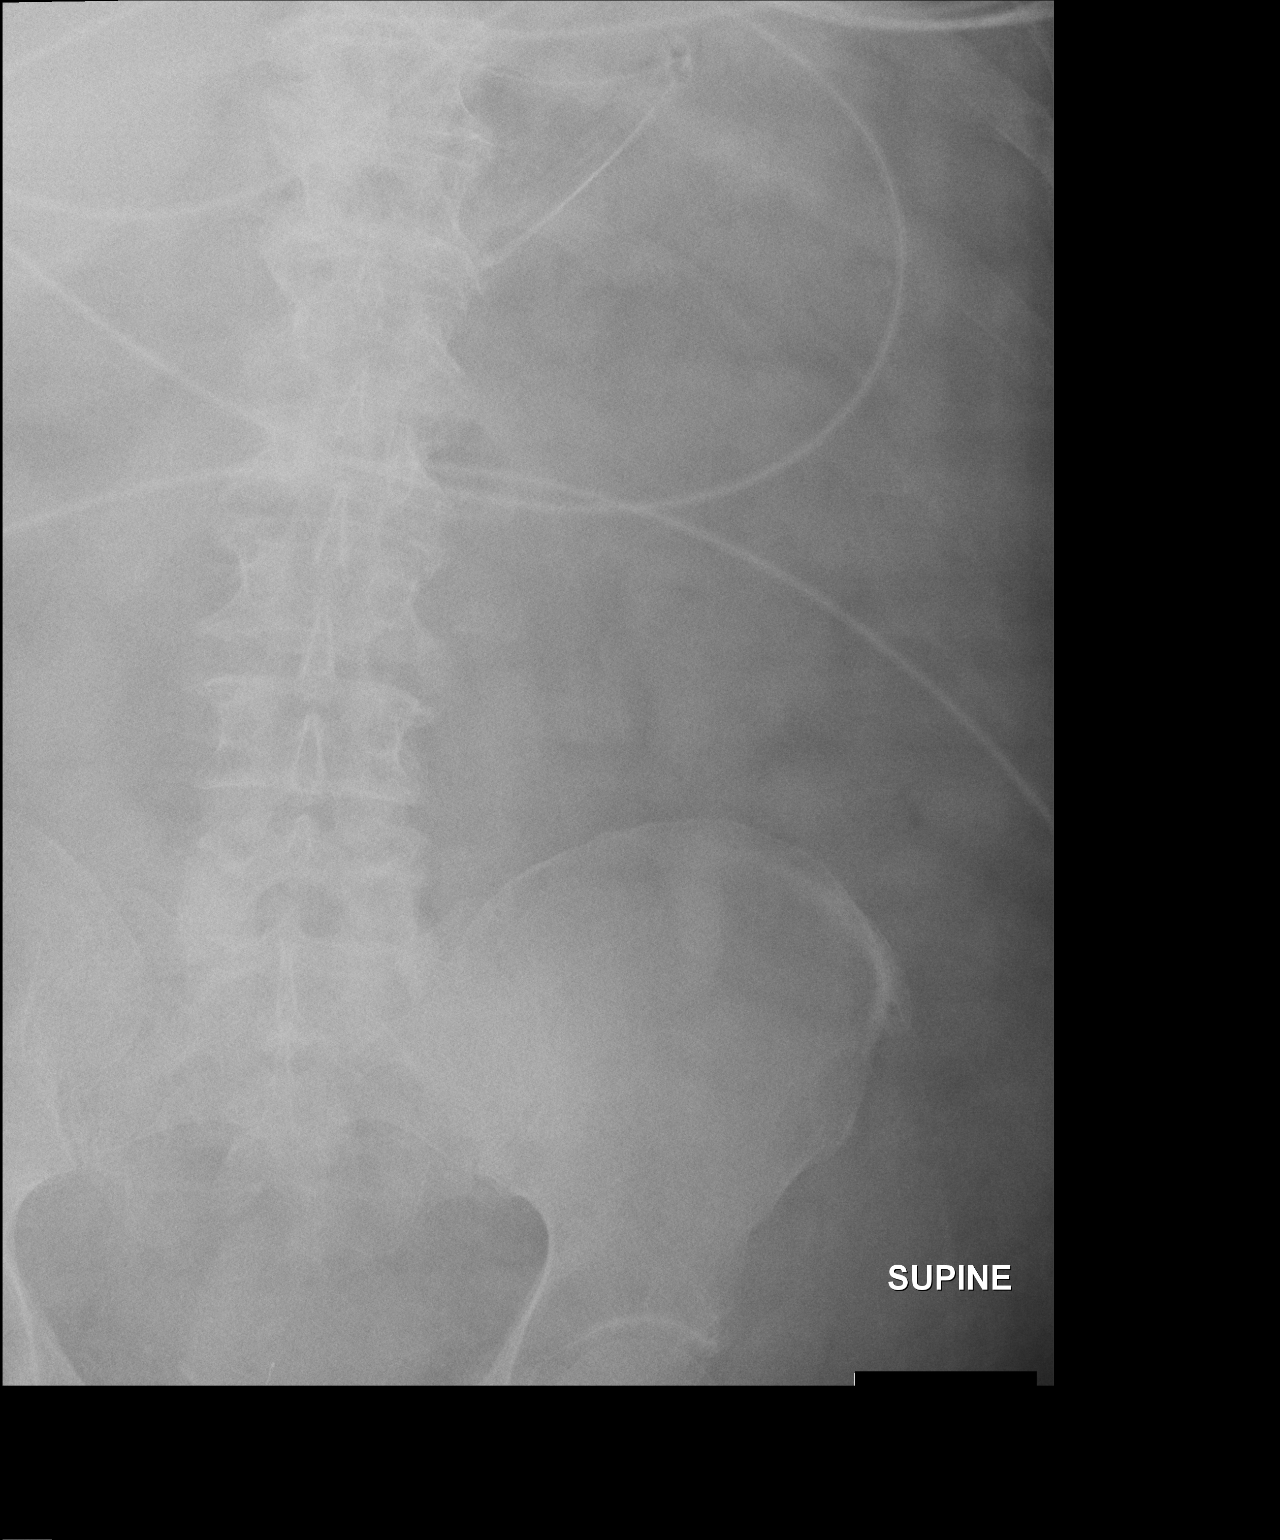

[2 of 2 positions shown; findings below may reference images not displayed]

FINDINGS: There is a paucity of gas throughout the abdomen. No convincing
evidence for bowel obstruction. NG tube is in the stomach. No free
air on this portable supine study. No visible organomegaly or
suspicious calcification.
IMPRESSION: Nonspecific bowel gas pattern with relative paucity of gas. No
evidence of obstruction.

## 2015-10-31 IMAGING — CR DG CHEST 1V PORT
1 series · 1 of 1 positions shown · non-contrast
Comparison: Earlier this day at 9992 hr

CLINICAL DATA: Endotracheal tube placement.

EXAM:
PORTABLE CHEST - 1 VIEW

[AP]
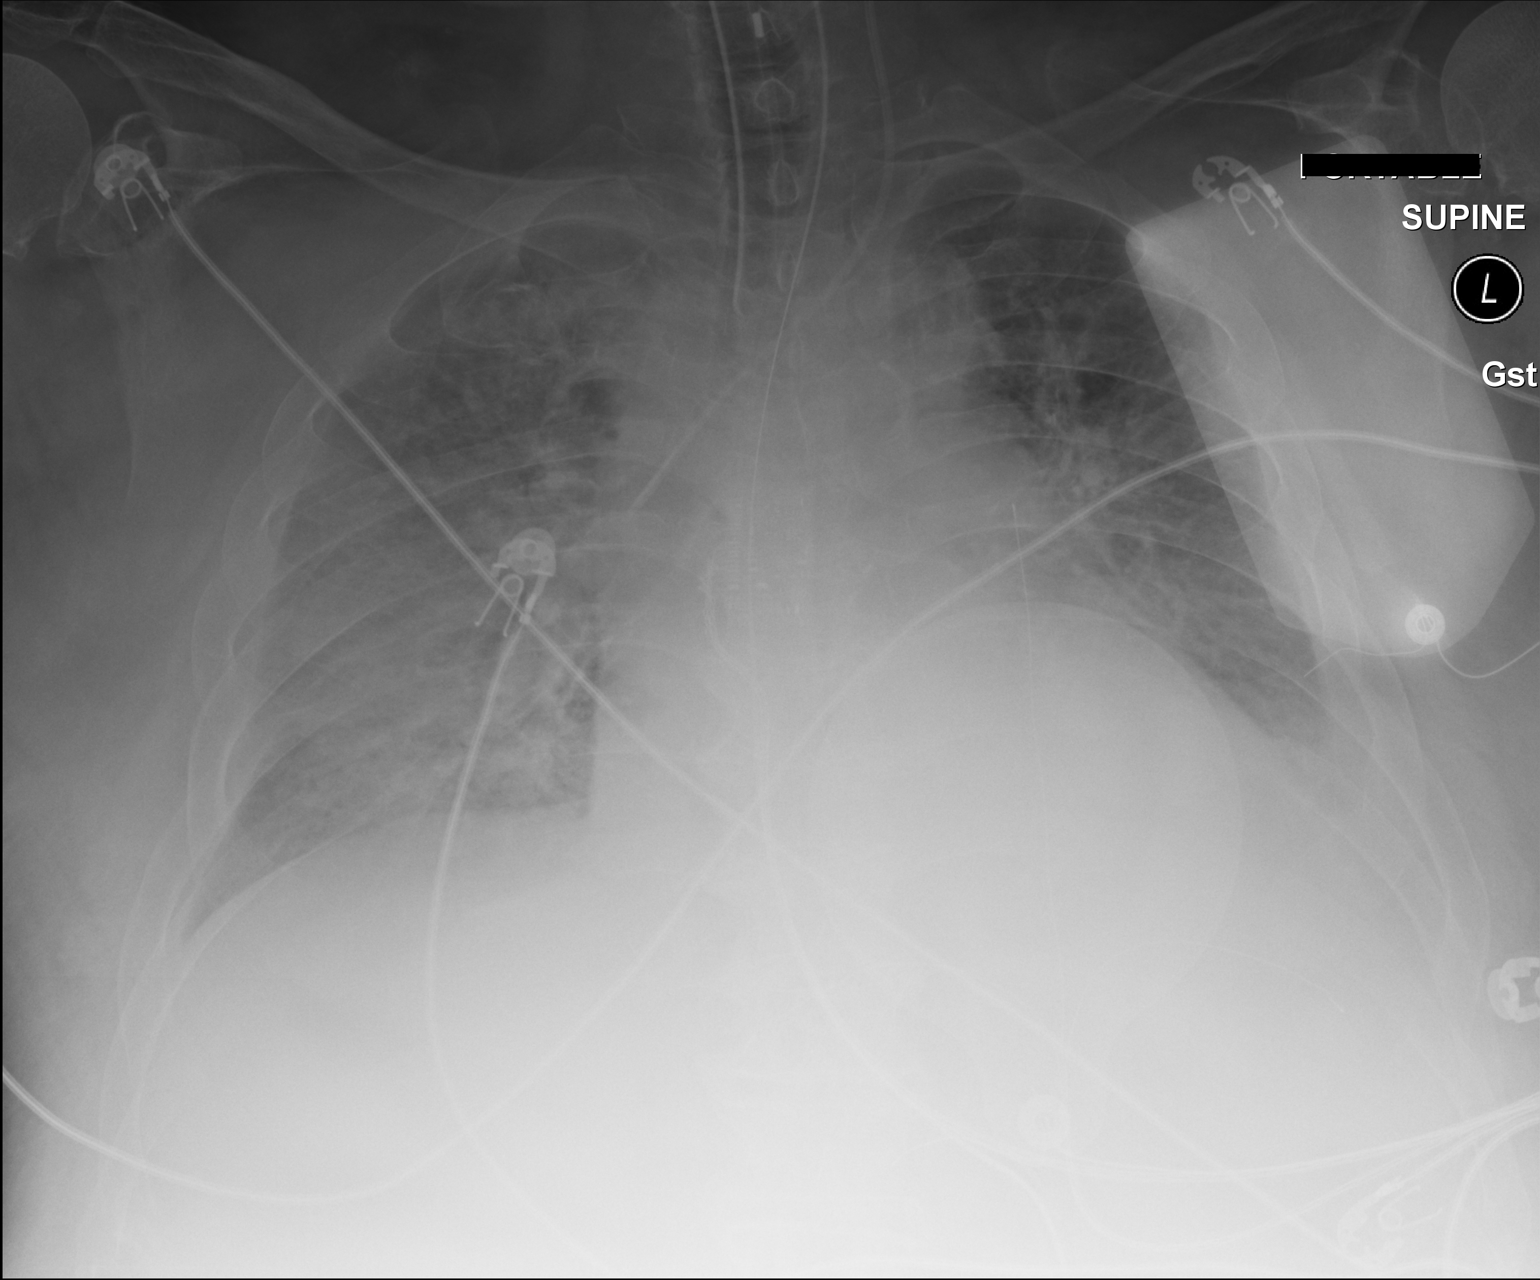

[1 of 1 positions shown; findings below may reference images not displayed]

FINDINGS: Endotracheal tube 3.3 cm from the carina. Enteric tube in place, mid
and distal portions not well seen, tip obscure due to multiple
overlying artifacts. Tip of the left central line in the SVC.
Cardiomegaly is grossly stable. Bilateral perihilar opacities,
minimally improved in the right lung. Bilateral pleural effusions
are unchanged.
IMPRESSION: 1. Endotracheal tube 3.3 cm from the carina.
2. Enteric tube in place, however not well visualized beyond the mid
distal esophagus due to overlying monitoring devices.

## 2015-11-04 ENCOUNTER — Encounter: Payer: Self-pay | Admitting: *Deleted

## 2015-11-10 ENCOUNTER — Encounter (HOSPITAL_COMMUNITY): Payer: BLUE CROSS/BLUE SHIELD

## 2015-11-10 ENCOUNTER — Encounter (HOSPITAL_COMMUNITY): Payer: Self-pay | Admitting: Hematology & Oncology

## 2015-11-10 ENCOUNTER — Encounter (HOSPITAL_COMMUNITY): Payer: BLUE CROSS/BLUE SHIELD | Attending: Hematology & Oncology | Admitting: Hematology & Oncology

## 2015-11-10 VITALS — BP 129/81 | HR 92 | Temp 98.0°F | Resp 20 | Wt 293.0 lb

## 2015-11-10 DIAGNOSIS — Z9889 Other specified postprocedural states: Secondary | ICD-10-CM | POA: Insufficient documentation

## 2015-11-10 DIAGNOSIS — Z7984 Long term (current) use of oral hypoglycemic drugs: Secondary | ICD-10-CM | POA: Insufficient documentation

## 2015-11-10 DIAGNOSIS — I482 Chronic atrial fibrillation: Secondary | ICD-10-CM | POA: Diagnosis not present

## 2015-11-10 DIAGNOSIS — Z7901 Long term (current) use of anticoagulants: Secondary | ICD-10-CM | POA: Insufficient documentation

## 2015-11-10 DIAGNOSIS — Z87891 Personal history of nicotine dependence: Secondary | ICD-10-CM | POA: Insufficient documentation

## 2015-11-10 DIAGNOSIS — N183 Chronic kidney disease, stage 3 unspecified: Secondary | ICD-10-CM

## 2015-11-10 DIAGNOSIS — E785 Hyperlipidemia, unspecified: Secondary | ICD-10-CM | POA: Insufficient documentation

## 2015-11-10 DIAGNOSIS — G4730 Sleep apnea, unspecified: Secondary | ICD-10-CM | POA: Diagnosis not present

## 2015-11-10 DIAGNOSIS — D472 Monoclonal gammopathy: Secondary | ICD-10-CM | POA: Insufficient documentation

## 2015-11-10 DIAGNOSIS — Z888 Allergy status to other drugs, medicaments and biological substances status: Secondary | ICD-10-CM | POA: Insufficient documentation

## 2015-11-10 DIAGNOSIS — Z8249 Family history of ischemic heart disease and other diseases of the circulatory system: Secondary | ICD-10-CM | POA: Diagnosis not present

## 2015-11-10 DIAGNOSIS — Z803 Family history of malignant neoplasm of breast: Secondary | ICD-10-CM | POA: Diagnosis not present

## 2015-11-10 DIAGNOSIS — Z833 Family history of diabetes mellitus: Secondary | ICD-10-CM | POA: Insufficient documentation

## 2015-11-10 DIAGNOSIS — Z86010 Personal history of colonic polyps: Secondary | ICD-10-CM | POA: Insufficient documentation

## 2015-11-10 DIAGNOSIS — I429 Cardiomyopathy, unspecified: Secondary | ICD-10-CM | POA: Insufficient documentation

## 2015-11-10 DIAGNOSIS — D696 Thrombocytopenia, unspecified: Secondary | ICD-10-CM

## 2015-11-10 DIAGNOSIS — I2510 Atherosclerotic heart disease of native coronary artery without angina pectoris: Secondary | ICD-10-CM | POA: Diagnosis not present

## 2015-11-10 DIAGNOSIS — R809 Proteinuria, unspecified: Secondary | ICD-10-CM | POA: Diagnosis not present

## 2015-11-10 DIAGNOSIS — I130 Hypertensive heart and chronic kidney disease with heart failure and stage 1 through stage 4 chronic kidney disease, or unspecified chronic kidney disease: Secondary | ICD-10-CM | POA: Diagnosis not present

## 2015-11-10 DIAGNOSIS — Z79899 Other long term (current) drug therapy: Secondary | ICD-10-CM | POA: Insufficient documentation

## 2015-11-10 DIAGNOSIS — Z85038 Personal history of other malignant neoplasm of large intestine: Secondary | ICD-10-CM | POA: Diagnosis not present

## 2015-11-10 LAB — CBC WITH DIFFERENTIAL/PLATELET
BASOS PCT: 1 %
Basophils Absolute: 0.1 10*3/uL (ref 0.0–0.1)
EOS ABS: 2 10*3/uL — AB (ref 0.0–0.7)
EOS PCT: 21 %
HCT: 42.6 % (ref 39.0–52.0)
HEMOGLOBIN: 14.3 g/dL (ref 13.0–17.0)
LYMPHS ABS: 1.7 10*3/uL (ref 0.7–4.0)
Lymphocytes Relative: 19 %
MCH: 32.1 pg (ref 26.0–34.0)
MCHC: 33.6 g/dL (ref 30.0–36.0)
MCV: 95.7 fL (ref 78.0–100.0)
Monocytes Absolute: 0.7 10*3/uL (ref 0.1–1.0)
Monocytes Relative: 8 %
NEUTROS PCT: 51 %
Neutro Abs: 4.7 10*3/uL (ref 1.7–7.7)
PLATELETS: 118 10*3/uL — AB (ref 150–400)
RBC: 4.45 MIL/uL (ref 4.22–5.81)
RDW: 15.6 % — ABNORMAL HIGH (ref 11.5–15.5)
WBC: 9.2 10*3/uL (ref 4.0–10.5)

## 2015-11-10 LAB — COMPREHENSIVE METABOLIC PANEL
ALBUMIN: 4 g/dL (ref 3.5–5.0)
ALT: 24 U/L (ref 17–63)
ANION GAP: 8 (ref 5–15)
AST: 30 U/L (ref 15–41)
Alkaline Phosphatase: 72 U/L (ref 38–126)
BUN: 47 mg/dL — ABNORMAL HIGH (ref 6–20)
CHLORIDE: 106 mmol/L (ref 101–111)
CO2: 26 mmol/L (ref 22–32)
Calcium: 9.6 mg/dL (ref 8.9–10.3)
Creatinine, Ser: 2.05 mg/dL — ABNORMAL HIGH (ref 0.61–1.24)
GFR calc non Af Amer: 33 mL/min — ABNORMAL LOW (ref >60)
GFR, EST AFRICAN AMERICAN: 38 mL/min — AB (ref >60)
GLUCOSE: 152 mg/dL — AB (ref 65–99)
Potassium: 4.1 mmol/L (ref 3.5–5.1)
SODIUM: 140 mmol/L (ref 135–145)
Total Bilirubin: 0.3 mg/dL (ref 0.3–1.2)
Total Protein: 7.7 g/dL (ref 6.5–8.1)

## 2015-11-10 NOTE — Progress Notes (Signed)
[facility]  NOTE  Patient Care Team: Timmothy Euler, MD as PCP - General (Family Medicine) Lorretta Harp, MD as Consulting Physician (Cardiology)  CHIEF COMPLAINTS/PURPOSE OF CONSULTATION:  24 hr urine with no M spike Proteinuria Serum protein electrophoresis with M spike 0.6 g/dl, IgG monoclonal protein with kappa light chain specificity POLYclonal gammopathy: IgA, kappa and lamdba typing appear increased  Stage III CKD History of Colon Cancer   HISTORY OF PRESENTING ILLNESS:  William Flores 65 y.o. male is here for follow-up of IgG MGUS and CKD.   Patient feels well. He reports a normal appetite and normal bowels. Diabetes and blood pressure are well managed  Patient has appointment with nephrologist next month. He has no complaints today. No new pain. He is trying to monitor his diet in regards to his diabetes He notes that he is trying to take better care of himself.   MEDICAL HISTORY:  Past Medical History:  Diagnosis Date  . CAD (coronary artery disease)    LHC 2/08:  mRCA 50%, o/w no sig CAD, EF 25%  . Cardiomyopathy ([place])    EF 35-40% in 2/16 in setting of AF with RVR >> echo 5/16 with improved LVF with EF 65-70%  . Chronic atrial fibrillation ([place])   . Chronic diastolic heart failure (HCC)    HFPF - EF ~65-70% (OFTEN EXACERBATED BY AFIB)  . CKD (chronic kidney disease)    hx of worsening renal failure and hyperK+ in setting of acute diast CHF >> required CVVHD in 4/16 and dialysis x 1 in 5/16  . Colon cancer ([place])   . Colon polyps 09/21/2012   Tubular adenoma  . Diabetes ([place])   . History of echocardiogram    Echo 05/16/14:  mild LVH, vigorous LVF, EF 65-70%, no RWMA, mean AV gradient 9 mmHg, MAC, mild MR, mod LAE, mild RVE, mild reduced RVF, severe RAE  . Hyperlipidemia   . Hypertension   . Obesity hypoventilation syndrome ([place])   . Renal insufficiency   . Sleep apnea     SURGICAL HISTORY: Past Surgical  History:  Procedure Laterality Date  . CIRCUMCISION    . COLON RESECTION    . COLONOSCOPY N/A 09/21/2012   Procedure: COLONOSCOPY;  Surgeon: Inda Castle, MD;  Location: [facility];  Service: Endoscopy;  Laterality: N/A;  . US ECHOCARDIOGRAPHY  03/04/2010   abnormal study    SOCIAL HISTORY: Social History   Social History  . Marital status: Divorced    Spouse name: N/A  . Number of children: 1  . Years of education: N/A   Occupational History  .  [employer] programmer   Social History Main Topics  . Smoking status: Former Smoker    Quit date: 08/06/1983  . Smokeless tobacco: Never Used  . Alcohol use No  . Drug use: No  . Sexual activity: Not on file   Other Topics Concern  . Not on file   Social History Narrative  . No narrative on file  Divorced 1 children-5 great grandchildren-1 great grandchild Hobbies-hunt and fish (last didn't get to a lot b/c leg hurts) hasn't smoked in 25-30 years No problems with ETOH Work-[employer] Mother died at 29 or 19. Died Was on Dialysis, took one of her legs fell out of her wheelchair and got worse after that. Father died at 45. Heart Attack. Had heart trouble. No brothers or sisters FAMILY HISTORY: Family History  Problem Relation Age of  Onset  . Breast cancer Mother   . Diabetes Mother   . Heart attack Father     ALLERGIES:  is allergic to codeine.  MEDICATIONS:  Current Outpatient Prescriptions  Medication Sig Dispense Refill  . allopurinol (ZYLOPRIM) 100 MG tablet TAKE 1 TABLET (100 MG TOTAL) BY MOUTH DAILY. 30 tablet 2  . atorvastatin (LIPITOR) 20 MG tablet Take 1 tablet (20 mg total) by mouth daily at 6 PM. PLEASE CONTACT OFFICE FOR ADDITIONAL REFILLS 90 tablet 3  . azelastine (OPTIVAR) 0.05 % ophthalmic solution Place 2 drops into both eyes 2 (two) times daily. Reported on 01/06/2015    . diltiazem (CARDIZEM CD) 180 MG 24 hr capsule TAKE 1 CAPSULE EVERY DAY 30 capsule 3  . Dulaglutide (TRULICITY) 8.24 MP/5.3IR  SOPN Inject 0.75 mg into the skin once a week. 4 pen 1  . ferrous sulfate 325 (65 FE) MG EC tablet Take 325 mg by mouth 2 (two) times daily.    . furosemide (LASIX) 80 MG tablet Pt takes 80 mg in the AM and 40 mg at PM as needed 45 tablet 3  . glucose blood test strip Use to check BG daily 100 each 2  . KLOR-CON M20 20 MEQ tablet TAKE 1/2 TABLET BY MOUTH DAILY 30 tablet 5  . losartan (COZAAR) 50 MG tablet Take 1 tablet (50 mg total) by mouth daily. 30 tablet 3  . metoprolol (LOPRESSOR) 50 MG tablet TAKE 1 TABLET (50 MG TOTAL) BY MOUTH 2 (TWO) TIMES DAILY. 180 tablet 1  . omeprazole (PRILOSEC) 20 MG capsule TAKE 1 CAPSULE (20 MG TOTAL) BY MOUTH DAILY. 30 capsule 5  . polyvinyl alcohol (LIQUIFILM TEARS) 1.4 % ophthalmic solution Place 1 drop into both eyes as needed for dry eyes. 15 mL 0  . sitaGLIPtin-metformin (JANUMET) 50-1000 MG per tablet Take 1 tablet by mouth 2 (two) times daily with a meal. Patient states he has been getting samples.    Marland Kitchen warfarin (COUMADIN) 5 MG tablet TAKE 1 TO 2 TABLETS DAILY AS DIRECTED BY ANTICOAGULATION CLINIC 180 tablet 0   No current facility-administered medications for this visit.     Review of Systems  Constitutional: Negative.   HENT: Negative.   Eyes: Negative.   Respiratory: Negative. Cardiovascular: Negative.   Gastrointestinal: Negative.   Genitourinary: Negative.   Musculoskeletal: Negative.   Skin: Negative.   Neurological: Negative.   Endo/Heme/Allergies: Negative.   Psychiatric/Behavioral: Negative.   All other systems reviewed and are negative. 14 point ROS was done and is otherwise as detailed above or in HPI   PHYSICAL EXAMINATION: ECOG PERFORMANCE STATUS: 1 - Symptomatic but completely ambulatory  Vitals:   11/10/15 1030  BP: 129/81  Pulse: 92  Resp: 20  Temp: 98 F (36.7 C)   Filed Weights   11/10/15 1030  Weight: 293 lb (132.9 kg)    Physical Exam  Constitutional: He is oriented to person, place, and time and  well-developed, well-nourished, and in no distress. No distress.  HENT:  Head: Normocephalic and atraumatic.  Mouth/Throat: Oropharynx is clear and moist. No oropharyngeal exudate.  Eyes: Conjunctivae and EOM are normal. Pupils are equal, round, and reactive to light. Right eye exhibits no discharge. Left eye exhibits no discharge. No scleral icterus.  Neck: Normal range of motion. Neck supple. No tracheal deviation present. No thyromegaly present.  Cardiovascular: Normal rate, regular rhythm and normal heart sounds.  Exam reveals no gallop.   No murmur heard. Pulmonary/Chest: Effort normal and breath sounds normal.  Abdominal: Soft. Bowel sounds are normal. He exhibits no mass. There is no tenderness. There is no rebound and no guarding.  Umbilical Hernia, Obese  Musculoskeletal: Normal range of motion.  Neurological: He is alert and oriented to person, place, and time. No cranial nerve deficit. Gait normal.  Skin: Skin is warm and dry. No rash noted. He is not diaphoretic. No erythema.  Psychiatric: Mood, memory, affect and judgment normal.  Nursing note and vitals reviewed.   LABORATORY DATA:  I have reviewed the data as listed  CBC    Component Value Date/Time   WBC 9.2 11/10/2015 1204   RBC 4.45 11/10/2015 1204   HGB 14.3 11/10/2015 1204   HCT 42.6 11/10/2015 1204   HCT 44.6 11/04/2014 0951   PLT 118 (L) 11/10/2015 1204   PLT 171 11/04/2014 0951   MCV 95.7 11/10/2015 1204   MCV 86 11/04/2014 0951   MCH 32.1 11/10/2015 1204   MCHC 33.6 11/10/2015 1204   RDW 15.6 (H) 11/10/2015 1204   RDW 19.7 (H) 11/04/2014 0951   LYMPHSABS 1.7 11/10/2015 1204   MONOABS 0.7 11/10/2015 1204   EOSABS 2.0 (H) 11/10/2015 1204   BASOSABS 0.1 11/10/2015 1204    CMP     Component Value Date/Time   NA 140 11/10/2015 1204   NA 142 10/15/2015 1040   K 4.1 11/10/2015 1204   CL 106 11/10/2015 1204   CO2 26 11/10/2015 1204   GLUCOSE 152 (H) 11/10/2015 1204   BUN 47 (H) 11/10/2015 1204    BUN 44 (H) 10/15/2015 1040   CREATININE 2.05 (H) 11/10/2015 1204   CREATININE 1.12 05/25/2012 1556   CALCIUM 9.6 11/10/2015 1204   PROT 7.7 11/10/2015 1204   PROT 7.1 09/17/2015 1114   ALBUMIN 4.0 11/10/2015 1204   ALBUMIN 4.1 09/17/2015 1114   AST 30 11/10/2015 1204   ALT 24 11/10/2015 1204   ALKPHOS 72 11/10/2015 1204   BILITOT 0.3 11/10/2015 1204   BILITOT 0.3 09/17/2015 1114   GFRNONAA 33 (L) 11/10/2015 1204   GFRNONAA 71 05/25/2012 1556   GFRAA 38 (L) 11/10/2015 1204   GFRAA 82 05/25/2012 1556           RADIOGRAPHIC STUDIES: I have personally reviewed the radiological images as listed and agreed with the findings in the report. Study Result   CLINICAL DATA:  Proteinuria and abnormal protein electrophoresis. The patient requires bone marrow biopsy for further workup.  EXAM: CT GUIDED BONE MARROW BIOPSY AND ASPIRATION  ANESTHESIA/SEDATION: Versed 1.0 mg IV, Fentanyl 50 mcg IV  Total Moderate Sedation Time  11 minutes.  PROCEDURE: The procedure risks, benefits, and alternatives were explained to the patient. Questions regarding the procedure were encouraged and answered. The patient understands and consents to the procedure. A time-out was performed prior to the procedure.  The right gluteal region was prepped with Betadine. Sterile gown and sterile gloves were used for the procedure. Local anesthesia was provided with 1% Lidocaine.  Under CT guidance, an 11 gauge OnControl bone cutting needle was advanced from a posterior approach into the right iliac bone. Needle positioning was confirmed with CT. Initial non heparinized and heparinized aspirate samples were obtained of bone marrow.  Core biopsy was performed via the 11 G needle.  COMPLICATIONS: None  FINDINGS: Inspection of initial aspirate did reveal visible particles. Intact core biopsy sample was obtained.  IMPRESSION: CT guided bone marrow biopsy of right posterior iliac bone  with both aspirate and core samples obtained.   Electronically Signed  By: Aletta Edouard M.D.   On: 05/05/2015 13:40    ASSESSMENT & PLAN:  IgG MGUS 24 hr urine with no M spike Proteinuria Serum protein electrophoresis with M spike 0.6 g/dl, IgG monoclonal protein with kappa light chain specificity POLYclonal gammopathy: IgA, kappa and lamdba typing appear increased  Stage III CKD History of Colon Cancer Metastatic Bone Survey with no focal bony abnormality or acute bony abnormality BMBX with 6% plasma cells, polyclonal staining with slight kappa chain excess Intermittent thrombocytopenia  He will be notified of all labs when available. CBC and CMP were reviewed with the patient. Results are noted above. Thrombocytopenia is intermittent and stable. No bleeding or bruising.  He continues to follow with nephrology for his CKD. He has upcoming follow-up Primary care needs are taken [facility] at [facility].  He will return for follow up in 4 months with labs noted below.  Orders Placed This Encounter  Procedures  . CBC with Differential    Standing Status:   Future    Standing Expiration Date:   11/09/2016  . Comprehensive metabolic panel    Standing Status:   Future    Standing Expiration Date:   11/09/2016  . Protein electrophoresis, serum    Standing Status:   Future    Standing Expiration Date:   11/09/2016  . Immunofixation electrophoresis    Standing Status:   Future    Standing Expiration Date:   11/09/2016  . Kappa/lambda light chains    Standing Status:   Future    Standing Expiration Date:   11/09/2016    All questions were answered. The patient knows to call the clinic with any problems, questions or concerns.  This document serves as a record of services personally performed by William Linsey, MD. It was created on her behalf by William Flores, a trained medical scribe. The creation of this record is based on the scribe's personal observations and the  provider's statements to them. This document has been checked and approved by the attending provider.  I have reviewed the above documentation for accuracy and completeness, and I agree with the above.  This note was electronically signed.    William Hazard, MD  11/10/2015 5:05 PM

## 2015-11-10 NOTE — Patient Instructions (Signed)
[name] at [facility] Discharge Instructions  RECOMMENDATIONS MADE BY THE CONSULTANT AND ANY TEST RESULTS WILL BE SENT TO YOUR REFERRING PHYSICIAN.  Exam and discussion today with Dr. Whitney Muse. Lab work today. Return in 6 months for office visit with Kirby Crigler, PA-C. Lab work in 6 months.   Thank you for choosing [facility] at [facility] to provide your oncology and hematology care.  To afford each patient quality time with our provider, please arrive at least 15 minutes before your scheduled appointment time.   Beginning January 23rd 2017 lab work for the [name] will be done in the  Main lab at [facility] on 1st floor. If you have a lab appointment with the [facility] please come in thru the  Main Entrance and check in at the main information desk  You need to re-schedule your appointment should you arrive 10 or more minutes late.  We strive to give you quality time with our providers, and arriving late affects you and other patients whose appointments are after yours.  Also, if you no show three or more times for appointments you may be dismissed from the clinic at the providers discretion.     Again, thank you for choosing [name].  Our hope is that these requests will decrease the amount of time that you wait before being seen by our physicians.       _____________________________________________________________  Should you have questions after your visit to [name], please contact our office at [phone] between the hours of 8:30 a.m. and 4:30 p.m.  Voicemails left after 4:30 p.m. will not be returned until the following business day.  For prescription refill requests, have your pharmacy contact our office.         Resources For Cancer Patients and their Caregivers ? American Cancer Society: Can assist with transportation, wigs, general needs, runs Look Good Feel Better.         816-097-2637 ? Cancer Care: Provides financial assistance, online support groups, medication/co-pay assistance.  1-800-813-HOPE 708-461-3892) ? [place] Assists [place] cancer patients and their families through emotional , educational and financial support.  423-415-8074 ? [place] DSS Where to apply for food stamps, Medicaid and utility assistance. 215-631-3368 ? [facility]: Transportation to medical appointments. 7071440388 ? Social Security Administration: May apply for disability if have a Stage IV cancer. 431 015 8926 702-490-1692 ? [employer], Disability and Transit Services: Assists with nutrition, care and transit needs. [place] Support Programs: @10RELATIVEDAYS @ > Cancer Support Group  2nd Tuesday of the month 1pm-2pm, Journey Room  > Creative Journey  3rd Tuesday of the month 1130am-1pm, Journey Room  > Look Good Feel Better  1st Wednesday of the month 10am-12 noon, Journey Room (Call [place] to register [phone])

## 2015-11-11 LAB — PROTEIN ELECTROPHORESIS, SERUM
A/G RATIO SPE: 0.9 (ref 0.7–1.7)
ALPHA-2-GLOBULIN: 1 g/dL (ref 0.4–1.0)
Albumin ELP: 3.5 g/dL (ref 2.9–4.4)
Alpha-1-Globulin: 0.2 g/dL (ref 0.0–0.4)
Beta Globulin: 1.3 g/dL (ref 0.7–1.3)
GLOBULIN, TOTAL: 3.8 g/dL (ref 2.2–3.9)
Gamma Globulin: 1.3 g/dL (ref 0.4–1.8)
M-Spike, %: 0.3 g/dL — ABNORMAL HIGH
Total Protein ELP: 7.3 g/dL (ref 6.0–8.5)

## 2015-11-11 LAB — KAPPA/LAMBDA LIGHT CHAINS
KAPPA, LAMDA LIGHT CHAIN RATIO: 1.19 (ref 0.26–1.65)
Kappa free light chain: 63.3 mg/L — ABNORMAL HIGH (ref 3.3–19.4)
LAMDA FREE LIGHT CHAINS: 53.1 mg/L — AB (ref 5.7–26.3)

## 2015-11-12 LAB — IMMUNOFIXATION ELECTROPHORESIS
IGA: 320 mg/dL (ref 61–437)
IGG (IMMUNOGLOBIN G), SERUM: 1093 mg/dL (ref 700–1600)
IGM, SERUM: 34 mg/dL (ref 20–172)
Total Protein ELP: 7.2 g/dL (ref 6.0–8.5)

## 2015-11-16 ENCOUNTER — Other Ambulatory Visit: Payer: Self-pay | Admitting: *Deleted

## 2015-11-16 ENCOUNTER — Other Ambulatory Visit: Payer: Self-pay | Admitting: Family Medicine

## 2015-11-16 MED ORDER — DULAGLUTIDE 0.75 MG/0.5ML ~~LOC~~ SOAJ: 0.7500 mg | SUBCUTANEOUS | 2 refills | Status: DC

## 2015-11-16 MED ORDER — ALLOPURINOL 100 MG PO TABS: ORAL_TABLET | ORAL | 2 refills | Status: DC

## 2015-11-16 MED ORDER — OMEPRAZOLE 20 MG PO CPDR: DELAYED_RELEASE_CAPSULE | ORAL | 2 refills | Status: DC

## 2015-11-18 ENCOUNTER — Ambulatory Visit (INDEPENDENT_AMBULATORY_CARE_PROVIDER_SITE_OTHER): Payer: BLUE CROSS/BLUE SHIELD | Admitting: Pharmacist

## 2015-11-18 ENCOUNTER — Other Ambulatory Visit: Payer: Self-pay | Admitting: Family Medicine

## 2015-11-18 ENCOUNTER — Other Ambulatory Visit: Payer: Self-pay | Admitting: Pharmacist

## 2015-11-18 ENCOUNTER — Encounter: Payer: Self-pay | Admitting: Pharmacist

## 2015-11-18 VITALS — BP 140/78 | HR 78 | Ht 64.0 in | Wt 291.0 lb

## 2015-11-18 DIAGNOSIS — E1122 Type 2 diabetes mellitus with diabetic chronic kidney disease: Secondary | ICD-10-CM | POA: Diagnosis not present

## 2015-11-18 DIAGNOSIS — I4891 Unspecified atrial fibrillation: Secondary | ICD-10-CM | POA: Diagnosis not present

## 2015-11-18 DIAGNOSIS — N183 Chronic kidney disease, stage 3 (moderate): Secondary | ICD-10-CM | POA: Diagnosis not present

## 2015-11-18 DIAGNOSIS — I10 Essential (primary) hypertension: Secondary | ICD-10-CM | POA: Diagnosis not present

## 2015-11-18 DIAGNOSIS — E1165 Type 2 diabetes mellitus with hyperglycemia: Secondary | ICD-10-CM | POA: Diagnosis not present

## 2015-11-18 DIAGNOSIS — IMO0002 Reserved for concepts with insufficient information to code with codable children: Secondary | ICD-10-CM

## 2015-11-18 LAB — COAGUCHEK XS/INR WAIVED
INR: 5 — AB (ref 0.9–1.1)
Prothrombin Time: 59.9 s

## 2015-11-18 MED ORDER — METFORMIN HCL 1000 MG PO TABS: 1000.0000 mg | ORAL_TABLET | Freq: Two times a day (BID) | ORAL | 3 refills | Status: DC

## 2015-11-18 NOTE — Patient Instructions (Signed)
Continue Trulicity 1.60VP - inject once weekly Change from Janumet to just metformin 1080m take 1 tablet twice a day with food.   Anticoagulation Dose Instructions as of 11/18/2015      SDorene GrebeTue Wed Thu Fri Sat   New Dose 7.5 mg 5 mg 7.5 mg 7.5 mg 5 mg 7.5 mg 7.5 mg    Description   No warfarin for 2 days (William Flores November 15th and Thurs, November 16th).  Then decrease dose to 1 tablet on Mondays and Thursdays.  Take 1 and 1/2 tablets all other day.

## 2015-11-18 NOTE — Progress Notes (Signed)
Subjective:    William Flores is a 65 y.o. male who presents for a followup evaluation of Type 2 diabetes mellitus and to check protime.    Diabetes:  William Flores was last see by Dr William Flores about 2 months ago and A1c had increased from 7.9% to 8.7%.   I last saw patient about 1 month ago and at that visit Trulicity 5.99HF was started.  He has seen a decrease in weight. He does report nausea 1-2 times per week and a little increase in reflux.    Known diabetic complications: nephropathy, peripheral neuropathy and cardiovascular disease  Patient sees nephrologist (next appt 12/2015) and cardiologist (last appt 07/2014) Current diabetic medications include Janumet 41/4239RV bid, Trulicity 2.02BX once weekly  Eye exam current (within one year): last was 11/13/2014 - has appt for 12/2015 with Dr William Flores  Home BG Monitoring: Has One Touch Verio IQ glucometer. Current monitoring regimen: home blood tests - 1-2 times daily mostly in the morning. 7 day avg = 142 14 day avg = 143 30 day avg = 144 90 day avg = 147 Am BG readings = 150, 124, 146, 131, 157, 148, 143, 133, 136, 148, 129, 134 Pm BG readings = 130, 228 (chocolate covered doughnut), 138, 162  Any episodes of hypoglycemia? no  HTN -  Patient is currently taking losartan 69m - 1 tablet daily  Anticoagulation - patient takes warfarin 550m- 1 and 1/2 tablets or 7.50m24maily.  Denies s/s of bleeding.  No medcation changes except addition of Trulicity  Obesity - since starting Trulicity patient reports his appetite has decreased.  Weight is decreasing steadily.    Objective:    BP 140/78   Pulse 78   Ht 5' 4"  (1.626 m)   Wt 291 lb (132 kg)   BMI 49.95 kg/m    Last A1c = 8.7% (09/17/2015) A1c = 7.9% (07/2015)  INR was 5.0 today  Lab Review Glucose (mg/dL)  Date Value  10/15/2015 202 (H)  09/17/2015 163 (H)  07/20/2015 188 (H)   Glucose, Bld (mg/dL)  Date Value  11/10/2015 152 (H)  03/31/2015 110 (H)  05/30/2014 94   CO2  (mmol/L)  Date Value  11/10/2015 26  10/15/2015 22  09/17/2015 20   BUN (mg/dL)  Date Value  11/10/2015 47 (H)  10/15/2015 44 (H)  09/17/2015 31 (H)  07/20/2015 32 (H)   Creat (mg/dL)  Date Value  05/25/2012 1.12   Creatinine, Ser (mg/dL)  Date Value  11/10/2015 2.05 (H)  10/15/2015 1.52 (H)  09/17/2015 1.52 (H)    Assessment:    Diabetes Mellitus type II, under improving control.   Supratherapeutic anticoagulation HTN  - controlled Obesity - weight is down about 11# since 09/20/15   Plan:    1.  Rx changes:   Continue Trulicity 0.74.35WY once weekly   D/C Janumet.  Start just metformin 1000m650m1 tablet bid  Continue losartan 50mg42m  Anticoagulation Dose Instructions as of 11/18/2015      Sun MDorene GrebeWed Thu Fri Sat   New Dose 7.5 mg 5 mg 7.5 mg 7.5 mg 5 mg 7.5 mg 7.5 mg    Description   No warfarin for 2 days (Wed,Emmie Niemannember 15th and Thurs, November 16th).  Then decrease dose to 1 tablet on Mondays and Thursdays.  Take 1 and 1/2 tablets all other day.      2.  Education: Reviewed 'ABCs' of diabetes management (respective goals in parentheses):  A1C (<7), blood pressure (<130/80), and cholesterol (LDL <100). 3. Continue with CHO counting, calorie limiting diet 4. RTC in 5 days to recheck INR   Follow up 1 month - DM and protimePatient ID: William Flores, male   DOB: 1950-03-23, 65 y.o.   MRN: [id number]

## 2015-11-23 ENCOUNTER — Ambulatory Visit (INDEPENDENT_AMBULATORY_CARE_PROVIDER_SITE_OTHER): Payer: BLUE CROSS/BLUE SHIELD | Admitting: Pharmacist

## 2015-11-23 DIAGNOSIS — I4891 Unspecified atrial fibrillation: Secondary | ICD-10-CM

## 2015-11-23 LAB — COAGUCHEK XS/INR WAIVED
INR: 2.4 — ABNORMAL HIGH (ref 0.9–1.1)
PROTHROMBIN TIME: 29.1 s

## 2015-12-07 ENCOUNTER — Ambulatory Visit (INDEPENDENT_AMBULATORY_CARE_PROVIDER_SITE_OTHER): Payer: BLUE CROSS/BLUE SHIELD | Admitting: Pharmacist

## 2015-12-07 DIAGNOSIS — I4891 Unspecified atrial fibrillation: Secondary | ICD-10-CM

## 2015-12-07 LAB — COAGUCHEK XS/INR WAIVED
INR: 2.8 — ABNORMAL HIGH (ref 0.9–1.1)
Prothrombin Time: 33.2 s

## 2015-12-09 ENCOUNTER — Other Ambulatory Visit: Payer: Self-pay | Admitting: Family Medicine

## 2015-12-16 ENCOUNTER — Other Ambulatory Visit: Payer: Self-pay | Admitting: Family Medicine

## 2015-12-22 ENCOUNTER — Ambulatory Visit (INDEPENDENT_AMBULATORY_CARE_PROVIDER_SITE_OTHER): Payer: BLUE CROSS/BLUE SHIELD | Admitting: Family Medicine

## 2015-12-22 ENCOUNTER — Encounter: Payer: Self-pay | Admitting: Family Medicine

## 2015-12-22 VITALS — BP 134/85 | HR 92 | Temp 97.5°F | Ht 64.0 in | Wt 293.0 lb

## 2015-12-22 DIAGNOSIS — N183 Chronic kidney disease, stage 3 (moderate): Secondary | ICD-10-CM

## 2015-12-22 DIAGNOSIS — E1165 Type 2 diabetes mellitus with hyperglycemia: Secondary | ICD-10-CM | POA: Diagnosis not present

## 2015-12-22 DIAGNOSIS — I10 Essential (primary) hypertension: Secondary | ICD-10-CM | POA: Diagnosis not present

## 2015-12-22 DIAGNOSIS — E1122 Type 2 diabetes mellitus with diabetic chronic kidney disease: Secondary | ICD-10-CM | POA: Diagnosis not present

## 2015-12-22 DIAGNOSIS — IMO0002 Reserved for concepts with insufficient information to code with codable children: Secondary | ICD-10-CM

## 2015-12-22 DIAGNOSIS — J209 Acute bronchitis, unspecified: Secondary | ICD-10-CM

## 2015-12-22 LAB — BAYER DCA HB A1C WAIVED: HB A1C: 7.7 % — AB (ref <7.0)

## 2015-12-22 MED ORDER — METFORMIN HCL 1000 MG PO TABS: 500.0000 mg | ORAL_TABLET | Freq: Two times a day (BID) | ORAL | 3 refills | Status: DC

## 2015-12-22 MED ORDER — CEFDINIR 300 MG PO CAPS: 300.0000 mg | ORAL_CAPSULE | Freq: Two times a day (BID) | ORAL | 0 refills | Status: DC

## 2015-12-22 NOTE — Progress Notes (Signed)
   HPI  Patient presents today to follow-up for diabetes.  Patient feels he is doing well, his average fasting blood sugar is 130-116 He denies any hypoglycemia He denies any foot or hand numbness or tingling. He is watching his diet and he is walking more intentionally. He is walking about 30 minutes 2-3 times a week at [employer].  Hypertension Good medication compliance  Cough She complains of 5 days of productive cough of thick white and green sputum, mild dyspnea, malaise, and nasal congestion. Patient states the cough is much worse at night. He's tried Tylenol and over-the-counter allergy medications with no improvement. He is tolerating foods and fluids normally. No overt fevers. He had one sick contact with similar illness.  PMH: Smoking status noted ROS: Per HPI  Objective: BP 134/85   Pulse 92   Temp 97.5 F (36.4 C) (Oral)   Ht 5' 4"  (1.626 m)   Wt 293 lb (132.9 kg)   BMI 50.29 kg/m  Gen: NAD, alert, cooperative with exam HEENT: NCAT, oropharynx moist and clear CV: RRR, good S1/S2, no murmur Resp: Nonlabored, expiratory rhonchorous sounds scattered Ext: No edema, warm Neuro: Alert and oriented, No gross deficits  Diabetic Foot Exam - Simple   Simple Foot Form Visual Inspection See comments:  Yes Sensation Testing Intact to touch and monofilament testing bilaterally:  Yes Pulse Check Posterior Tibialis and Dorsalis pulse intact bilaterally:  Yes Comments Very thickened elongated toenails bilaterally, diffuse scaling of the feet      Assessment and plan:  # Acute bronchitis Possibly developing underlying pneumonia, given course of illness and comorbidities and treating a bit more aggressively than usual Omnicef, this should minimize effect on INR.   # Type 2 diabetes Control improving, A1c 7.7 Given recently decreased GFR at decreased his metformin dose to 500 g twice daily He's tolerating GLP-1 agonist very well, continue  # Chronic kidney disease  stage III Decreased GFR, adjusting metformin Patient has follow-up with nephrology in the morning, appreciate their recommendations and management  # HTN Well-controlled on metoprolol and Lasix, No changes    Orders Placed This Encounter  Procedures  . Bayer DCA Hb A1c Waived    Meds ordered this encounter  Medications  . cefdinir (OMNICEF) 300 MG capsule    Sig: Take 1 capsule (300 mg total) by mouth 2 (two) times daily. 1 po BID    Dispense:  20 capsule    Refill:  0  . metFORMIN (GLUCOPHAGE) 1000 MG tablet    Sig: Take 0.5 tablets (500 mg total) by mouth 2 (two) times daily with a meal. D/C janumet    Dispense:  180 tablet    Refill:  [place], MD [facility] 12/22/2015, 10:29 AM

## 2015-12-22 NOTE — Patient Instructions (Signed)
Great to see you!  Reduce your metformin to 1/2 pill twice daily.   Be sure to keep your appointment with Nephrology tomorrow.

## 2016-01-06 ENCOUNTER — Other Ambulatory Visit: Payer: BLUE CROSS/BLUE SHIELD

## 2016-01-25 ENCOUNTER — Other Ambulatory Visit: Payer: Self-pay | Admitting: Family Medicine

## 2016-01-25 ENCOUNTER — Ambulatory Visit (INDEPENDENT_AMBULATORY_CARE_PROVIDER_SITE_OTHER): Payer: BLUE CROSS/BLUE SHIELD | Admitting: Pharmacist

## 2016-01-25 DIAGNOSIS — Z794 Long term (current) use of insulin: Secondary | ICD-10-CM | POA: Diagnosis not present

## 2016-01-25 DIAGNOSIS — E119 Type 2 diabetes mellitus without complications: Secondary | ICD-10-CM

## 2016-01-25 DIAGNOSIS — E1121 Type 2 diabetes mellitus with diabetic nephropathy: Secondary | ICD-10-CM | POA: Diagnosis not present

## 2016-01-25 DIAGNOSIS — I4891 Unspecified atrial fibrillation: Secondary | ICD-10-CM | POA: Diagnosis not present

## 2016-01-25 LAB — COAGUCHEK XS/INR WAIVED
INR: 2.4 — ABNORMAL HIGH (ref 0.9–1.1)
PROTHROMBIN TIME: 28.6 s

## 2016-01-25 MED ORDER — GLUCOSE BLOOD VI STRP: ORAL_STRIP | 2 refills | Status: DC

## 2016-01-26 LAB — MICROALBUMIN / CREATININE URINE RATIO
Creatinine, Urine: 56.5 mg/dL
MICROALB/CREAT RATIO: 1233.3 mg/g{creat} — AB (ref 0.0–30.0)
MICROALBUM., U, RANDOM: 696.8 ug/mL

## 2016-02-07 ENCOUNTER — Other Ambulatory Visit: Payer: Self-pay | Admitting: Family Medicine

## 2016-02-11 ENCOUNTER — Encounter: Payer: Self-pay | Admitting: *Deleted

## 2016-02-22 ENCOUNTER — Other Ambulatory Visit: Payer: Self-pay | Admitting: Family Medicine

## 2016-03-06 ENCOUNTER — Other Ambulatory Visit: Payer: Self-pay | Admitting: Family Medicine

## 2016-03-07 ENCOUNTER — Ambulatory Visit (INDEPENDENT_AMBULATORY_CARE_PROVIDER_SITE_OTHER): Payer: BLUE CROSS/BLUE SHIELD | Admitting: Pharmacist

## 2016-03-07 VITALS — Ht 64.0 in | Wt 293.5 lb

## 2016-03-07 DIAGNOSIS — I4891 Unspecified atrial fibrillation: Secondary | ICD-10-CM

## 2016-03-07 DIAGNOSIS — M1A09X0 Idiopathic chronic gout, multiple sites, without tophus (tophi): Secondary | ICD-10-CM

## 2016-03-07 LAB — COAGUCHEK XS/INR WAIVED
INR: 2.9 — ABNORMAL HIGH (ref 0.9–1.1)
PROTHROMBIN TIME: 35.4 s

## 2016-03-07 NOTE — Progress Notes (Signed)
Patient ID: William Flores, male   DOB: November 01, 1950, 66 y.o.   MRN: [id number]   Subjective:    William Flores is a 66 y.o. male who presents for a followup evaluation of Type 2 diabetes mellitus and to check protime.  He also mentions that he has increased acute gout episodes over the last 6 months.  He is currently taking allopurinol 154m qd.  Diabetes:  Mr. GAngererwas last see by Dr BWendi Snipesabout 3 months ago and A1c had decreased from 83.0%to 71.6%Trulicity 00.10XNwas started.  He has seen a decrease in weight. He does report nausea 1-2 times per week and a little increase in reflux.    Known diabetic complications: nephropathy, peripheral neuropathy and cardiovascular disease  Patient sees nephrologist (next appt 12/2015) and cardiologist (last appt 07/2014) Current diabetic medications include Janumet 523/5573UKbid, Trulicity 00.25KYonce weekly  Eye exam current (within one year): last was 12/2015 with Dr LMarin Comment Home BG Monitoring: Has One Touch Verio IQ glucometer. Current monitoring regimen: home blood tests - 1-2 times daily mostly in the morning. HBG range 130 to 200.  Any episodes of hypoglycemia? no  Macroproteinuria -  Patient is currently taking losartan 527m- 1 tablet daily.  Last albumin level showed improvement.   Anticoagulation - patient takes warfarin 24m33m 1 and 1/2 tablets or 7.24mg24mily.  Denies s/s of bleeding.   Obesity - since starting Trulicity patient reports his appetite has decreased.  Weight is stable currently.    Objective:    Ht _0  (1.626 m)   Wt 293 lb 8 oz (133.1 kg)   BMI 50.38 kg/m    Estimated serum creatinine = 30 to 40mL27m Last serum creatinine = 2.03 (11/2015)  Last A1c = 7.7% (12/22/2015) A1c = 8.7% (09/17/2015) A1c = 7.9% (07/2015)  INR was 2.9 today  Lab Review Glucose (mg/dL)  Date Value  10/15/2015 202 (H)  09/17/2015 163 (H)  07/20/2015 188 (H)   Glucose, Bld (mg/dL)  Date Value  11/10/2015 152 (H)  03/31/2015  110 (H)  05/30/2014 94   CO2 (mmol/L)  Date Value  11/10/2015 26  10/15/2015 22  09/17/2015 20   BUN (mg/dL)  Date Value  11/10/2015 47 (H)  10/15/2015 44 (H)  09/17/2015 31 (H)  07/20/2015 32 (H)   Creat (mg/dL)  Date Value  05/25/2012 1.12   Creatinine, Ser (mg/dL)  Date Value  11/10/2015 2.05 (H)  10/15/2015 1.52 (H)  09/17/2015 1.52 (H)    Assessment:    Diabetes Mellitus type II, under improving control.   Therapeutic anticoagulation Obesity - weight is down about 10# since 09/20/15 Gout / decreased renal function - serum creatinine increased in December - rechecking today    Plan:    1.  Rx changes:   Continue Trulicity 0.724m7.06CBnce weekly   Continue losartan 50mg 30m Anticoagulation Dose Instructions as of 03/07/2016      Sun MoDorene Grebeed Thu Fri Sat   New Dose 7.5 mg 5 mg 7.5 mg 7.5 mg 5 mg 7.5 mg 7.5 mg    Description   Continue current warfarin 24mg ta57mt dose  - Take 1 tablet on Mondays and Thursdays.  Take 1 and 1/2 tablets all other day.  INR was 2.4 today      2.  Education: Reviewed 'ABCs' of diabetes management (respective goals in parentheses):  A1C (<7), blood pressure (<130/80), and cholesterol (LDL <100). 3.  Orders Placed  This Encounter  Procedures  . BMP8+EGFR  . Uric acid    Once labs back will consider either increasing allopurinol to 285m or if need to change to Uloric or other medication for gout. 4. RTC in 1 month to see PCP and 2 months to see me.

## 2016-03-08 LAB — BMP8+EGFR
BUN/Creatinine Ratio: 24 (ref 10–24)
BUN: 32 mg/dL — AB (ref 8–27)
CALCIUM: 9.4 mg/dL (ref 8.6–10.2)
CHLORIDE: 99 mmol/L (ref 96–106)
CO2: 23 mmol/L (ref 18–29)
Creatinine, Ser: 1.36 mg/dL — ABNORMAL HIGH (ref 0.76–1.27)
GFR, EST AFRICAN AMERICAN: 63 mL/min/{1.73_m2} (ref >59)
GFR, EST NON AFRICAN AMERICAN: 55 mL/min/{1.73_m2} — AB (ref >59)
Glucose: 207 mg/dL — ABNORMAL HIGH (ref 65–99)
Potassium: 3.9 mmol/L (ref 3.5–5.2)
Sodium: 143 mmol/L (ref 134–144)

## 2016-03-08 LAB — URIC ACID: Uric Acid: 9.5 mg/dL — ABNORMAL HIGH (ref 3.7–8.6)

## 2016-03-09 ENCOUNTER — Other Ambulatory Visit: Payer: Self-pay | Admitting: Pharmacist

## 2016-03-09 MED ORDER — COLCHICINE 0.6 MG PO TABS: ORAL_TABLET | ORAL | 1 refills | Status: DC

## 2016-03-09 MED ORDER — ALLOPURINOL 100 MG PO TABS: 200.0000 mg | ORAL_TABLET | Freq: Every day | ORAL | 2 refills | Status: DC

## 2016-03-10 ENCOUNTER — Other Ambulatory Visit: Payer: Self-pay | Admitting: Family Medicine

## 2016-03-13 ENCOUNTER — Other Ambulatory Visit: Payer: Self-pay | Admitting: Family Medicine

## 2016-03-23 ENCOUNTER — Other Ambulatory Visit: Payer: Self-pay | Admitting: Physician Assistant

## 2016-03-27 ENCOUNTER — Other Ambulatory Visit: Payer: Self-pay | Admitting: Physician Assistant

## 2016-03-28 NOTE — Telephone Encounter (Signed)
This is a pt of Dr. Percival Spanish. Please advise

## 2016-03-29 NOTE — Telephone Encounter (Signed)
REFILL 

## 2016-04-04 ENCOUNTER — Other Ambulatory Visit: Payer: Self-pay | Admitting: Family Medicine

## 2016-04-12 ENCOUNTER — Ambulatory Visit (INDEPENDENT_AMBULATORY_CARE_PROVIDER_SITE_OTHER): Payer: BLUE CROSS/BLUE SHIELD | Admitting: Family Medicine

## 2016-04-12 ENCOUNTER — Encounter: Payer: Self-pay | Admitting: Family Medicine

## 2016-04-12 VITALS — BP 155/86 | HR 62 | Temp 97.4°F | Ht 64.0 in | Wt 291.2 lb

## 2016-04-12 DIAGNOSIS — E1122 Type 2 diabetes mellitus with diabetic chronic kidney disease: Secondary | ICD-10-CM

## 2016-04-12 DIAGNOSIS — N183 Chronic kidney disease, stage 3 (moderate): Secondary | ICD-10-CM | POA: Diagnosis not present

## 2016-04-12 DIAGNOSIS — I4891 Unspecified atrial fibrillation: Secondary | ICD-10-CM

## 2016-04-12 DIAGNOSIS — M109 Gout, unspecified: Secondary | ICD-10-CM | POA: Diagnosis not present

## 2016-04-12 DIAGNOSIS — Z7901 Long term (current) use of anticoagulants: Secondary | ICD-10-CM

## 2016-04-12 DIAGNOSIS — E1165 Type 2 diabetes mellitus with hyperglycemia: Secondary | ICD-10-CM | POA: Diagnosis not present

## 2016-04-12 DIAGNOSIS — Z5181 Encounter for therapeutic drug level monitoring: Secondary | ICD-10-CM | POA: Diagnosis not present

## 2016-04-12 DIAGNOSIS — IMO0002 Reserved for concepts with insufficient information to code with codable children: Secondary | ICD-10-CM

## 2016-04-12 LAB — BAYER DCA HB A1C WAIVED: HB A1C (BAYER DCA - WAIVED): 8.9 % — ABNORMAL HIGH (ref <7.0)

## 2016-04-12 LAB — COAGUCHEK XS/INR WAIVED
INR: 2.6 — AB (ref 0.9–1.1)
Prothrombin Time: 31.2 s

## 2016-04-12 MED ORDER — CANAGLIFLOZIN 100 MG PO TABS: 100.0000 mg | ORAL_TABLET | Freq: Every day | ORAL | 2 refills | Status: DC

## 2016-04-12 MED ORDER — POTASSIUM CHLORIDE CRYS ER 20 MEQ PO TBCR: 10.0000 meq | EXTENDED_RELEASE_TABLET | Freq: Every day | ORAL | 1 refills | Status: DC

## 2016-04-12 NOTE — Progress Notes (Signed)
   HPI  Patient presents today here to follow-up for chronic medical conditions.  Gout is doing well, taking 200 mg allopurinol daily, did not take colchicine  Diabetes Reports no changes in medications or dietary discretion. Watching diet moderately. No regular exercise.  Atrial fibrillation Good medication compliance, no bleeding.   PMH: Smoking status noted ROS: Per HPI  Objective: BP (!) 155/86   Pulse 62   Temp 97.4 F (36.3 C) (Oral)   Ht 5' 4"  (1.626 m)   Wt 291 lb 3.2 oz (132.1 kg)   BMI 49.98 kg/m  Gen: NAD, alert, cooperative with exam HEENT: NCAT CV: RRR, good S1/S2, no murmur Resp: CTABL, no wheezes, non-labored Ext: No edema, warm Neuro: Alert and oriented Diabetic Foot Exam - Simple   Simple Foot Form Diabetic Foot exam was performed with the following findings:  Yes 04/12/2016 11:57 AM  Visual Inspection See comments:  Yes Sensation Testing Intact to touch and monofilament testing bilaterally:  Yes Pulse Check Posterior Tibialis and Dorsalis pulse intact bilaterally:  Yes Comments Bilateral toes with thick and long toenails      Assessment and plan:  # Type 2 diabetes Uncontrolled, continue metformin at renal dose, weekly GLP Adding invokana, 100 mg based on reduced GFR RTC in 3 months Recommended podiatry follow-up  # Gout Tolerating allopurinol well, no symptoms right now Checking uric acid, previously 9.5  # Atrial fibrillation, anticoagulated INR at goal, no changes in Coumadin Rate controlled on diltiazem     Orders Placed This Encounter  Procedures  . Bayer DCA Hb A1c Waived  . CoaguChek XS/INR Waived  . Uric Acid  . CMP14+EGFR    Meds ordered this encounter  Medications  . canagliflozin (INVOKANA) 100 MG TABS tablet    Sig: Take 1 tablet (100 mg total) by mouth daily before breakfast.    Dispense:  30 tablet    Refill:  2  . potassium chloride SA (KLOR-CON M20) 20 MEQ tablet    Sig: Take 0.5 tablets (10 mEq total)  by mouth daily. NEED OV.    Dispense:  45 tablet    Refill:  [place], MD [facility] 04/12/2016, 12:00 PM

## 2016-04-12 NOTE — Patient Instructions (Signed)
Great to see you!  Continue 2 pills of allopurinol once daily for now  Start invokana with your other diabetic medications 1 pill once daily.

## 2016-04-13 ENCOUNTER — Other Ambulatory Visit: Payer: Self-pay | Admitting: Family Medicine

## 2016-04-13 LAB — CMP14+EGFR
A/G RATIO: 1.3 (ref 1.2–2.2)
ALBUMIN: 3.9 g/dL (ref 3.6–4.8)
ALT: 22 IU/L (ref 0–44)
AST: 26 IU/L (ref 0–40)
Alkaline Phosphatase: 74 IU/L (ref 39–117)
BUN / CREAT RATIO: 27 — AB (ref 10–24)
BUN: 36 mg/dL — ABNORMAL HIGH (ref 8–27)
Bilirubin Total: 0.4 mg/dL (ref 0.0–1.2)
CALCIUM: 9.5 mg/dL (ref 8.6–10.2)
CO2: 23 mmol/L (ref 18–29)
CREATININE: 1.33 mg/dL — AB (ref 0.76–1.27)
Chloride: 100 mmol/L (ref 96–106)
GFR calc Af Amer: 65 mL/min/{1.73_m2} (ref >59)
GFR, EST NON AFRICAN AMERICAN: 56 mL/min/{1.73_m2} — AB (ref >59)
GLOBULIN, TOTAL: 3.1 g/dL (ref 1.5–4.5)
Glucose: 267 mg/dL — ABNORMAL HIGH (ref 65–99)
POTASSIUM: 4.2 mmol/L (ref 3.5–5.2)
SODIUM: 142 mmol/L (ref 134–144)
Total Protein: 7 g/dL (ref 6.0–8.5)

## 2016-04-13 LAB — URIC ACID: Uric Acid: 8.7 mg/dL — ABNORMAL HIGH (ref 3.7–8.6)

## 2016-04-13 MED ORDER — ALLOPURINOL 300 MG PO TABS: 300.0000 mg | ORAL_TABLET | Freq: Every day | ORAL | 6 refills | Status: DC

## 2016-04-28 ENCOUNTER — Other Ambulatory Visit: Payer: Self-pay | Admitting: *Deleted

## 2016-04-28 ENCOUNTER — Other Ambulatory Visit: Payer: Self-pay

## 2016-04-28 MED ORDER — LOSARTAN POTASSIUM 50 MG PO TABS: 50.0000 mg | ORAL_TABLET | Freq: Every day | ORAL | 0 refills | Status: DC

## 2016-04-28 MED ORDER — OMEPRAZOLE 20 MG PO CPDR: DELAYED_RELEASE_CAPSULE | ORAL | 0 refills | Status: DC

## 2016-05-07 ENCOUNTER — Other Ambulatory Visit: Payer: Self-pay | Admitting: Family Medicine

## 2016-05-10 ENCOUNTER — Encounter (HOSPITAL_COMMUNITY): Payer: Self-pay | Admitting: Oncology

## 2016-05-10 ENCOUNTER — Encounter (HOSPITAL_COMMUNITY): Payer: BLUE CROSS/BLUE SHIELD

## 2016-05-10 ENCOUNTER — Encounter (HOSPITAL_COMMUNITY): Payer: BLUE CROSS/BLUE SHIELD | Attending: Oncology | Admitting: Oncology

## 2016-05-10 VITALS — BP 131/73 | HR 114 | Temp 98.1°F | Resp 18 | Wt 290.8 lb

## 2016-05-10 DIAGNOSIS — I482 Chronic atrial fibrillation: Secondary | ICD-10-CM | POA: Insufficient documentation

## 2016-05-10 DIAGNOSIS — E1122 Type 2 diabetes mellitus with diabetic chronic kidney disease: Secondary | ICD-10-CM | POA: Diagnosis not present

## 2016-05-10 DIAGNOSIS — Z85038 Personal history of other malignant neoplasm of large intestine: Secondary | ICD-10-CM | POA: Insufficient documentation

## 2016-05-10 DIAGNOSIS — G4730 Sleep apnea, unspecified: Secondary | ICD-10-CM | POA: Insufficient documentation

## 2016-05-10 DIAGNOSIS — N183 Chronic kidney disease, stage 3 unspecified: Secondary | ICD-10-CM

## 2016-05-10 DIAGNOSIS — D472 Monoclonal gammopathy: Secondary | ICD-10-CM | POA: Diagnosis not present

## 2016-05-10 DIAGNOSIS — I2510 Atherosclerotic heart disease of native coronary artery without angina pectoris: Secondary | ICD-10-CM | POA: Insufficient documentation

## 2016-05-10 DIAGNOSIS — Z86010 Personal history of colonic polyps: Secondary | ICD-10-CM | POA: Insufficient documentation

## 2016-05-10 DIAGNOSIS — Z992 Dependence on renal dialysis: Secondary | ICD-10-CM | POA: Insufficient documentation

## 2016-05-10 DIAGNOSIS — Z87891 Personal history of nicotine dependence: Secondary | ICD-10-CM | POA: Insufficient documentation

## 2016-05-10 DIAGNOSIS — I130 Hypertensive heart and chronic kidney disease with heart failure and stage 1 through stage 4 chronic kidney disease, or unspecified chronic kidney disease: Secondary | ICD-10-CM | POA: Insufficient documentation

## 2016-05-10 DIAGNOSIS — E785 Hyperlipidemia, unspecified: Secondary | ICD-10-CM | POA: Insufficient documentation

## 2016-05-10 LAB — CBC WITH DIFFERENTIAL/PLATELET
BASOS ABS: 0.1 10*3/uL (ref 0.0–0.1)
Basophils Relative: 1 %
Eosinophils Absolute: 0.2 10*3/uL (ref 0.0–0.7)
Eosinophils Relative: 2 %
HEMATOCRIT: 44.5 % (ref 39.0–52.0)
HEMOGLOBIN: 14.5 g/dL (ref 13.0–17.0)
LYMPHS PCT: 19 %
Lymphs Abs: 1.4 10*3/uL (ref 0.7–4.0)
MCH: 30.3 pg (ref 26.0–34.0)
MCHC: 32.6 g/dL (ref 30.0–36.0)
MCV: 92.9 fL (ref 78.0–100.0)
MONO ABS: 0.7 10*3/uL (ref 0.1–1.0)
MONOS PCT: 9 %
NEUTROS ABS: 4.8 10*3/uL (ref 1.7–7.7)
NEUTROS PCT: 69 %
Platelets: 146 10*3/uL — ABNORMAL LOW (ref 150–400)
RBC: 4.79 MIL/uL (ref 4.22–5.81)
RDW: 17.1 % — AB (ref 11.5–15.5)
WBC: 7 10*3/uL (ref 4.0–10.5)

## 2016-05-10 LAB — COMPREHENSIVE METABOLIC PANEL
ALBUMIN: 4 g/dL (ref 3.5–5.0)
ALK PHOS: 77 U/L (ref 38–126)
ALT: 42 U/L (ref 17–63)
ANION GAP: 13 (ref 5–15)
AST: 58 U/L — ABNORMAL HIGH (ref 15–41)
BILIRUBIN TOTAL: 0.5 mg/dL (ref 0.3–1.2)
BUN: 31 mg/dL — ABNORMAL HIGH (ref 6–20)
CALCIUM: 9.5 mg/dL (ref 8.9–10.3)
CO2: 25 mmol/L (ref 22–32)
Chloride: 103 mmol/L (ref 101–111)
Creatinine, Ser: 1.59 mg/dL — ABNORMAL HIGH (ref 0.61–1.24)
GFR calc non Af Amer: 44 mL/min — ABNORMAL LOW (ref >60)
GFR, EST AFRICAN AMERICAN: 51 mL/min — AB (ref >60)
GLUCOSE: 295 mg/dL — AB (ref 65–99)
Potassium: 3.5 mmol/L (ref 3.5–5.1)
Sodium: 141 mmol/L (ref 135–145)
TOTAL PROTEIN: 7.8 g/dL (ref 6.5–8.1)

## 2016-05-10 NOTE — Assessment & Plan Note (Signed)
Chronic renal disease, stage III, followed by Fran Lowes, MD (Nephrology).

## 2016-05-10 NOTE — Assessment & Plan Note (Signed)
IgG monoclonal protein with kappa light chain specificity with small M-spike and a POLYclonal gammopathy with IgA, kappa/lambda typing both appear increased.  Additionally, there is no M-spike on 24 hr urine but with proteinuria.  Bone marrow aspiration and biopsy on 05/05/2015 demonstrated a variably cellular bone marrow with trilineage hematopoiesis and slight plasmacytosis (plasma cells 6%) and polyclonal staining pattern for kappa and lambda light chain specificity, suggestive of plasma cell dyscrasia/neoplasm.  Labs today: CBC diff, CMET, SPEP+IFE, light chain assay, IgG, IgA, IgM.  I personally reviewed and went over laboratory results with the patient.  The results are noted within this dictation.  Labs in 6 months: CBC diff, CMET, SPEP+IFE, light chain assay, IgG, IgA, IgM.  MGUS is defined as a serum monoclonal protein (M-Protein) at a concentration of <3 g/dL, a bone marrow with < 10% monoclonal plasma cells, and absence of end-organ damage (lytic bone lesions, anemia, hypercalcemia, renal insufficiency, hyperviscosity) related to the proliferative process.  Non-IgM MGUS (IgG, IgA, or IgD MGUS is the most common subtype of MGUS and has the potential to progress to smoldering (asymptomatic) multiple myeloma and to symptomatic multiple myeloma.  Less frequently, these patients progress to AL amyloidosis, light chain deposition disease, or another lymphoproliferative disorder.  IgM MGUS accounts for approximately 15% of MGUS cases.  It is considered separately from non-IgM MGUS because it has the potential to progress to smoldering Waldenstrom macroglobinemia and to symptomatic Waldenstrom macroglobulinemia and less often to lymphoma or AL amyloidosis.  Infrequently, IgM MGUS can progress to IgM multiple myeloma.Light chain MGUS (LC-MGUS) is a unique subtype of MGUS in which the secreted monoclonal protein lacks the immunoglobulin heavy chain component. LC-MGUS may progress to idiopathic Bence Jones  proteinemia and to light chain multiple myeloma, AL amyloidosis, or light chain deposition disease.  Monoclonal gammopathy of undetermined significance (MGUS) is diagnosed in persons who meet the following three criteria:  ?Serum monoclonal protein (whether IgA, IgG, or IgM) <3 g/dL  ?Clonal bone marrow plasma cells <10 percent  ?Absence of lytic lesions, anemia, hypercalcemia, and renal insufficiency (end-organ damage) that can be attributed to the plasma cell proliferative disorder MGUS carries a risk of progression to MM of approximately 1 percent per year. Differentiation of MGUS from MM can be difficult and is primarily based on presence or absence of related end-organ damage. In comparison to overt MM or plasma cell leukemia, most patients with MGUS or SMM have few or no circulating monoclonal plasma cells. The use of other clinical and laboratory factors to differentiate between these two entities is discussed in more detail separately.   Return in 6 months for follow-up.

## 2016-05-10 NOTE — Progress Notes (Signed)
William Euler, MD [place] [address]  MGUS (monoclonal gammopathy of unknown significance) - Plan: CBC with Differential, Comprehensive metabolic panel, Kappa/lambda light chains, IgG, IgA, IgM, Immunofixation electrophoresis, Protein electrophoresis, serum  CKD stage 3 due to type 2 diabetes mellitus (HCC)  CURRENT THERAPY: Observation  INTERVAL HISTORY: William Flores 66 y.o. male returns for followup of IgG monoclonal protein with kappa light chain specificity with small M-spike and a POLYclonal gammopathy with IgA, kappa/lambda typing both appear increased.  Additionally, there is no M-spike on 24 hr urine but with proteinuria. Bone marrow aspiration and biopsy on 05/05/2015 demonstrated a variably cellular bone marrow with trilineage hematopoiesis and slight plasmacytosis (plasma cells 6%) and polyclonal staining pattern for kappa and lambda light chain specificity, suggestive of plasma cell dyscrasia/neoplasm. AND History of colon cancer AND CKD, stage III  HPI Elements MGUS  Location: Blood  Quality: IgG, kappa light chain  Severity: Mild  Duration:   Context: Stage III chronic renal disease  Timing:   Modifying Factors: Renal disease  Associated Signs & Symptoms:    He reports a muscle strain that increases with pain with particular movements.  He notes that it has improved.  It has resolved and lower extremity muscle cramps as well.  His potassium today is within normal limits.  He is provided education regarding MGUS.   He otherwise denies any complaints.  His appetite is stable.  Weight is stable. He is morbidly obese. He admits to recent change in his diabetic medications due to above baseline hyperglycemia.  Review of Systems  Constitutional: Negative.  Negative for chills, fever and weight loss.  HENT: Negative.   Eyes: Negative.   Respiratory: Negative.  Negative for cough.   Cardiovascular: Negative.  Negative for chest pain.    Gastrointestinal: Negative.  Negative for blood in stool, constipation, diarrhea, melena, nausea and vomiting.  Genitourinary: Negative.   Musculoskeletal: Negative.   Skin: Negative.   Neurological: Negative.  Negative for weakness.  Endo/Heme/Allergies: Negative.   Psychiatric/Behavioral: Negative.     Past Medical History:  Diagnosis Date  . CAD (coronary artery disease)    LHC 2/08:  mRCA 50%, o/w no sig CAD, EF 25%  . Cardiomyopathy ([place])    EF 35-40% in 2/16 in setting of AF with RVR >> echo 5/16 with improved LVF with EF 65-70%  . Chronic atrial fibrillation ([place])   . Chronic diastolic heart failure (HCC)    HFPF - EF ~65-70% (OFTEN EXACERBATED BY AFIB)  . CKD (chronic kidney disease)    hx of worsening renal failure and hyperK+ in setting of acute diast CHF >> required CVVHD in 4/16 and dialysis x 1 in 5/16  . CKD stage 3 due to type 2 diabetes mellitus ([place]) 09/17/2015  . Colon cancer ([place])   . Colon polyps 09/21/2012   Tubular adenoma  . Diabetes ([place])   . History of echocardiogram    Echo 05/16/14:  mild LVH, vigorous LVF, EF 65-70%, no RWMA, mean AV gradient 9 mmHg, MAC, mild MR, mod LAE, mild RVE, mild reduced RVF, severe RAE  . Hyperlipidemia   . Hypertension   . Obesity hypoventilation syndrome ([place])   . Renal insufficiency   . Sleep apnea     Past Surgical History:  Procedure Laterality Date  . CIRCUMCISION    . COLON RESECTION    . COLONOSCOPY N/A 09/21/2012   Procedure: COLONOSCOPY;  Surgeon: Inda Castle, MD;  Location: [facility];  Service: Endoscopy;  Laterality: N/A;  . US ECHOCARDIOGRAPHY  03/04/2010   abnormal study    Family History  Problem Relation Age of Onset  . Breast cancer Mother   . Diabetes Mother   . Heart attack Father     Social History   Social History  . Marital status: Divorced    Spouse name: N/A  . Number of children: 1  . 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 Smoking status: Former Smoker    Quit date: 08/06/1983  . Smokeless tobacco: Never Used  . Alcohol use No  . Drug use: No  . Sexual activity: Not Asked   Other Topics Concern  . None   Social History Narrative  . None     PHYSICAL EXAMINATION  ECOG PERFORMANCE STATUS: 1 - Symptomatic but completely ambulatory  Vitals:   05/10/16 1429  BP: 131/73  Pulse: (!) 114  Resp: 18  Temp: 98.1 F (36.7 C)    GENERAL:alert, well nourished, well developed, comfortable, cooperative, obese, smiling and unaccompanied SKIN: skin color, texture, turgor are normal, no rashes or significant lesions HEAD: Normocephalic, No masses, lesions, tenderness or abnormalities EYES: normal, EOMI, Conjunctiva are pink and non-injected EARS: External ears normal OROPHARYNX:lips, buccal mucosa, and tongue normal and mucous membranes are moist  NECK: supple, no adenopathy, trachea midline LYMPH:  no palpable lymphadenopathy BREAST:not examined LUNGS: clear to auscultation  HEART: regular rate & rhythm ABDOMEN:abdomen soft, non-tender, obese and normal bowel sounds BACK: Back symmetric, no curvature. EXTREMITIES:less then 2 second capillary refill, no joint deformities, effusion, or inflammation, no skin discoloration, no cyanosis  NEURO: alert & oriented x 3 with fluent speech, no focal motor/sensory deficits, gait normal   LABORATORY DATA: CBC    Component Value Date/Time   WBC 7.0 05/10/2016 1324   RBC 4.79 05/10/2016 1324   HGB 14.5 05/10/2016 1324   HCT 44.5 05/10/2016 1324   HCT 44.6 11/04/2014 0951   PLT 146 (L) 05/10/2016 1324   PLT 171 11/04/2014 0951   MCV 92.9 05/10/2016 1324   MCV 86 11/04/2014 0951   MCH 30.3 05/10/2016 1324   MCHC 32.6 05/10/2016 1324   RDW 17.1 (H) 05/10/2016 1324   RDW 19.7 (H) 11/04/2014 0951   LYMPHSABS 1.4 05/10/2016 1324   MONOABS 0.7 05/10/2016 1324   EOSABS 0.2 05/10/2016 1324   BASOSABS 0.1 05/10/2016 1324      Chemistry       Component Value Date/Time   NA 141 05/10/2016 1324   NA 142 04/12/2016 1147   K 3.5 05/10/2016 1324   CL 103 05/10/2016 1324   CO2 25 05/10/2016 1324   BUN 31 (H) 05/10/2016 1324   BUN 36 (H) 04/12/2016 1147   CREATININE 1.59 (H) 05/10/2016 1324   CREATININE 1.12 05/25/2012 1556      Component Value Date/Time   CALCIUM 9.5 05/10/2016 1324   ALKPHOS 77 05/10/2016 1324   AST 58 (H) 05/10/2016 1324   ALT 42 05/10/2016 1324   BILITOT 0.5 05/10/2016 1324   BILITOT 0.4 04/12/2016 1147     Lab Results  Component Value Date   PROT 7.8 05/10/2016   ALBUMINELP 3.5 11/10/2015   A1GS 0.2 11/10/2015   A2GS 1.0 11/10/2015   BETS 1.3 11/10/2015   GAMS 1.3 11/10/2015   MSPIKE 0.3 (H) 11/10/2015   SPEI Comment 11/10/2015   SPECOM Comment 11/10/2015  IGGSERUM 1,093 11/10/2015   IGA 320 11/10/2015   IGMSERUM 34 11/10/2015   KPAFRELGTCHN 63.3 (H) 11/10/2015   LAMBDASER 53.1 (H) 11/10/2015   KAPLAMBRATIO 1.19 11/10/2015     PENDING LABS:   RADIOGRAPHIC STUDIES:  No results found.   PATHOLOGY:    ASSESSMENT AND PLAN:  MGUS (monoclonal gammopathy of unknown significance)  IgG monoclonal protein with kappa light chain specificity with small M-spike and a POLYclonal gammopathy with IgA, kappa/lambda typing both appear increased.  Additionally, there is no M-spike on 24 hr urine but with proteinuria.  Bone marrow aspiration and biopsy on 05/05/2015 demonstrated a variably cellular bone marrow with trilineage hematopoiesis and slight plasmacytosis (plasma cells 6%) and polyclonal staining pattern for kappa and lambda light chain specificity, suggestive of plasma cell dyscrasia/neoplasm.  Labs today: CBC diff, CMET, SPEP+IFE, light chain assay, IgG, IgA, IgM.  I personally reviewed and went over laboratory results with the patient.  The results are noted within this dictation.  Labs in 6 months: CBC diff, CMET, SPEP+IFE, light chain assay, IgG, IgA, IgM.  MGUS is defined as a serum  monoclonal protein (M-Protein) at a concentration of <3 g/dL, a bone marrow with < 10% monoclonal plasma cells, and absence of end-organ damage (lytic bone lesions, anemia, hypercalcemia, renal insufficiency, hyperviscosity) related to the proliferative process.  Non-IgM MGUS (IgG, IgA, or IgD MGUS is the most common subtype of MGUS and has the potential to progress to smoldering (asymptomatic) multiple myeloma and to symptomatic multiple myeloma.  Less frequently, these patients progress to AL amyloidosis, light chain deposition disease, or another lymphoproliferative disorder.  IgM MGUS accounts for approximately 15% of MGUS cases.  It is considered separately from non-IgM MGUS because it has the potential to progress to smoldering Waldenstrom macroglobinemia and to symptomatic Waldenstrom macroglobulinemia and less often to lymphoma or AL amyloidosis.  Infrequently, IgM MGUS can progress to IgM multiple myeloma.Light chain MGUS (LC-MGUS) is a unique subtype of MGUS in which the secreted monoclonal protein lacks the immunoglobulin heavy chain component. LC-MGUS may progress to idiopathic Bence Jones proteinemia and to light chain multiple myeloma, AL amyloidosis, or light chain deposition disease.  Monoclonal gammopathy of undetermined significance (MGUS) is diagnosed in persons who meet the following three criteria:  ?Serum monoclonal protein (whether IgA, IgG, or IgM) <3 g/dL  ?Clonal bone marrow plasma cells <10 percent  ?Absence of lytic lesions, anemia, hypercalcemia, and renal insufficiency (end-organ damage) that can be attributed to the plasma cell proliferative disorder MGUS carries a risk of progression to MM of approximately 1 percent per year. Differentiation of MGUS from MM can be difficult and is primarily based on presence or absence of related end-organ damage. In comparison to overt MM or plasma cell leukemia, most patients with MGUS or SMM have few or no circulating monoclonal plasma cells.  The use of other clinical and laboratory factors to differentiate between these two entities is discussed in more detail separately.   Return in 6 months for follow-up.   CKD stage 3 due to type 2 diabetes mellitus (HCC) Chronic renal disease, stage III, followed by Fran Lowes, MD (Nephrology).   Number of Diagnoses or Treatment Options- Section A:      Problems to Exam Physician Problem(s) Number x Points= Results  Self-limited or minor (stable, improved, or worsening)  Max=2  1 1   Est. Problem (to examiner); stable, improved   1 2  Est. Problem (to examiner); worsening   2   New problem (to examiner); no  additional work-up planned  Max=1 3   New problem (to examiner); add. work-up planned   4      Total: 3    Amount and/or Complexity of Data to be Reviewed- Section B    Data to be reviewed: Points    Review and/or order of clinical lab tests 1  [x]    Review and/or order of tests in the radiology section of CPT (includes nuclear med & other except cardiac cath & ECG) 1  []    Review and/or order of tests in the medicine section of CPT (e.g. EKG, cardiac cath, non-invasive vascular studies, pulmonary function studies) 1  []    Discussion of test results with performing physician 1  []    Decision to obtain old records and/or obtaining history from someone other than patient 1  []    Review and summarization of old records and/or obtaining history from someone other than patient and/or discussion of case with another health care provider 2  [x]    Independent visualization of image, tracing, or specimen itself (not simply review report) 2  []    Total:   3    Risk of  complications and/or Morbidity or Mortality- Section C  Level of Risk: Presenting Problem(s) Diagnostic Procedure(s) Ordered Management Options Selected  Minimal One self-limited or minor problem (eg cold, insect bite, tinea corporis)  Lab test requiring venipuncture  Chest xray  EKG/EEG  [place] or Echo  KOH  prep  Urinalysis  Rest  Gargles  Elastic bandages  Superficial dressings  Low  Two or more self-limited or minor problems  One stable chronic illness (well-controlled HTN, non-insulin dependent diabetes, cataract, BPH)  Acute uncomplicated illness or injury (cystitis, allergic rhinitis, simple sprain)  Physiologic test not under stress (pulm fnx tests)  Non-cardiovascular imaging studies with contrast (barium enema)  Superficial needle biopsy  Clinical laboratory tests requiring arterial puncture  Skin biopsies  OTC drugs  Minor surgery with no identified risk factors  Physical therapy  Occupational therapy  IV fluids without additives  Moderate  One or more chronic illnesses with mild exacerbation, progression, or side effects of treatment  Two or more stable chronic illnesses  Undiagnosed new problem with uncertain prognosis (lump in breast)  Acute illness with systemic symptoms (pyelonephritis, pneumonitis, colitis)  Acute complicated injury (head injury with brief loss of consciousness)  Physiologic test under stress (cardiac stress test, fetal contraction stress test)  Diagnostic endoscopies with no identified risk factors  Deep needle or incisional biopsy  Cardiovascular imaging studies with contrast and no identified risk factors (arteriogram, cardiac cath)  Obtain fluid from body cavity (LP, thoracentesis, culdocentesis)  Minor surgery with identified risk factors  Elective surgery (open, percutaneous, or endoscopic) with no identified risk factors  Prescription drug management  Therapeutic nuclear medicine  IV fluids with additives  Closed treatment of fracture of dislocation without manipulation  High  One or more chronic illnesses with severe exacerbation, progression, or side effects of treatment.  Acute or chronic illnesses or injuries that may pose a threat to life or bodily function (multiple trauma, acute MI, PE, severe respiratory  distress, progressive severe rheumatoid arthritis, psychiatric illness with potential threat to self or others, peritonitis, acute renal failure)  An abrupt change in neurological status (seizure, TIA, weakness, sensory loss)  Cardiovascular imaging studies with contrast with identified risk factors  Cardiac electrophysiological tests  Diagnostic endoscopies with identified risk factors  Discography   Elective major surgery (open, percutaneous, or endoscopic) with identified risk factor  Emergency  major surgery (open, percutaneous, or endoscopic)  Parental controlled substances  Drug therapy requiring intensive monitoring for toxicity  Decision not to resuscitate or deescalate care because of poor prognosis     Final Result of Complexity      Choose decision making level with 2 or 3 checks OR choose the decision making level on Section B       A Number of diagnoses or treatment options  []   </= 1 Minimal  []   2 Limited  [x]   3 Multiple  []   >/= 4 Extensive  B Amount and complexity of data  []   </= 1 Minimal or low  []   2 Limited  [x]   3  Moderate  []   >/= 4 Extensive  C Highest risk  []   Minimal  []   Low  [x]   Moderate  []   High   Type of decision making  []   Straight-forward  []   Low Complexity  [x]   Moderate- Complexity  []   High- Complexity     ORDERS PLACED FOR THIS ENCOUNTER: Orders Placed This Encounter  Procedures  . CBC with Differential  . Comprehensive metabolic panel  . Kappa/lambda light chains  . IgG, IgA, IgM  . Immunofixation electrophoresis  . Protein electrophoresis, serum    MEDICATIONS PRESCRIBED THIS ENCOUNTER: Meds ordered this encounter  Medications  . allopurinol (ZYLOPRIM) 100 MG tablet    Sig: TAKE 2 TABLETS (200 MG TOTAL) BY MOUTH DAILY.    Refill:  2    THERAPY PLAN:  Continue to monitor for CRAB findings: C= Calcium increase of greater than 11.5 mg/dL R = Renal insufficiency with creatinine > 2 mg/dL A= Anemia  with Hgb < 10 g/dL B= Bone disease with lytic lesions and/or osteopenia  All questions were answered. The patient knows to call the clinic with any problems, questions or concerns. We can certainly see the patient much sooner if necessary.  Patient and plan discussed with Dr. Twana First and she is in agreement with the aforementioned.   This note is electronically signed by: Doy Mince 05/10/2016 2:49 PM

## 2016-05-10 NOTE — Patient Instructions (Signed)
[place] at [facility] Discharge Instructions  RECOMMENDATIONS MADE BY THE CONSULTANT AND ANY TEST RESULTS WILL BE SENT TO YOUR REFERRING PHYSICIAN.  You were seen today by Kirby Crigler PA-C. Return in 6 months for labs and follow up.   Thank you for choosing [place] at [facility] to provide your oncology and hematology care.  To afford each patient quality time with our provider, please arrive at least 15 minutes before your scheduled appointment time.    If you have a lab appointment with the [facility] please come in thru the  Main Entrance and check in at the main information desk  You need to re-schedule your appointment should you arrive 10 or more minutes late.  We strive to give you quality time with our providers, and arriving late affects you and other patients whose appointments are after yours.  Also, if you no show three or more times for appointments you may be dismissed from the clinic at the providers discretion.     Again, thank you for choosing [facility].  Our hope is that these requests will decrease the amount of time that you wait before being seen by our physicians.       _____________________________________________________________  Should you have questions after your visit to [facility], please contact our office at [phone] between the hours of 8:30 a.m. and 4:30 p.m.  Voicemails left after 4:30 p.m. will not be returned until the following business day.  For prescription refill requests, have your pharmacy contact our office.       Resources For Cancer Patients and their Caregivers ? American Cancer Society: Can assist with transportation, wigs, general needs, runs Look Good Feel Better.        (434) 299-1996 ? Cancer Care: Provides financial assistance, online support groups, medication/co-pay assistance.  1-800-813-HOPE 631-585-9661) ? [place] Assists [place] cancer patients and their families through emotional , educational and financial support.  412-675-9709 ? [place] DSS Where to apply for food stamps, Medicaid and utility assistance. 857-349-8940 ? [employer]: Transportation to medical appointments. 445-186-7507 ? Social Security Administration: May apply for disability if have a Stage IV cancer. (604)754-4133 938-354-6946 ? [employer], Disability and Transit Services: Assists with nutrition, care and transit needs. [place] Support Programs: @10RELATIVEDAYS @ > Cancer Support Group  2nd Tuesday of the month 1pm-2pm, Journey Room  > Creative Journey  3rd Tuesday of the month 1130am-1pm, Journey Room  > Look Good Feel Better  1st Wednesday of the month 10am-12 noon, Journey Room (Call [place] to register [phone])

## 2016-05-11 LAB — PROTEIN ELECTROPHORESIS, SERUM
A/G Ratio: 1 (ref 0.7–1.7)
ALPHA-1-GLOBULIN: 0.2 g/dL (ref 0.0–0.4)
Albumin ELP: 3.8 g/dL (ref 2.9–4.4)
Alpha-2-Globulin: 0.9 g/dL (ref 0.4–1.0)
Beta Globulin: 1.4 g/dL — ABNORMAL HIGH (ref 0.7–1.3)
GAMMA GLOBULIN: 1.4 g/dL (ref 0.4–1.8)
Globulin, Total: 3.9 g/dL (ref 2.2–3.9)
M-SPIKE, %: 0.5 g/dL — AB
TOTAL PROTEIN ELP: 7.7 g/dL (ref 6.0–8.5)

## 2016-05-11 LAB — KAPPA/LAMBDA LIGHT CHAINS
KAPPA FREE LGHT CHN: 55.6 mg/L — AB (ref 3.3–19.4)
Kappa, lambda light chain ratio: 0.89 (ref 0.26–1.65)
LAMDA FREE LIGHT CHAINS: 62.3 mg/L — AB (ref 5.7–26.3)

## 2016-05-13 ENCOUNTER — Telehealth: Payer: Self-pay | Admitting: Family Medicine

## 2016-05-13 ENCOUNTER — Other Ambulatory Visit: Payer: Self-pay | Admitting: *Deleted

## 2016-05-13 LAB — IMMUNOFIXATION ELECTROPHORESIS: Total Protein ELP: UNDETERMINED g/dL

## 2016-05-13 MED ORDER — DILTIAZEM HCL ER COATED BEADS 180 MG PO CP24: 180.0000 mg | ORAL_CAPSULE | Freq: Every day | ORAL | 1 refills | Status: DC

## 2016-05-13 NOTE — Telephone Encounter (Signed)
Pt notified of RX 

## 2016-05-17 ENCOUNTER — Ambulatory Visit (INDEPENDENT_AMBULATORY_CARE_PROVIDER_SITE_OTHER): Payer: BLUE CROSS/BLUE SHIELD | Admitting: Pharmacist

## 2016-05-17 DIAGNOSIS — I4891 Unspecified atrial fibrillation: Secondary | ICD-10-CM

## 2016-05-17 LAB — COAGUCHEK XS/INR WAIVED
INR: 2.3 — AB (ref 0.9–1.1)
Prothrombin Time: 27.9 s

## 2016-05-17 MED ORDER — DULAGLUTIDE 1.5 MG/0.5ML ~~LOC~~ SOAJ: 1.5000 mg | SUBCUTANEOUS | 0 refills | Status: DC

## 2016-05-24 ENCOUNTER — Other Ambulatory Visit: Payer: BLUE CROSS/BLUE SHIELD

## 2016-05-25 ENCOUNTER — Other Ambulatory Visit: Payer: Self-pay | Admitting: Family Medicine

## 2016-06-11 ENCOUNTER — Other Ambulatory Visit: Payer: Self-pay | Admitting: Family Medicine

## 2016-06-29 ENCOUNTER — Other Ambulatory Visit: Payer: Self-pay | Admitting: Family Medicine

## 2016-06-29 ENCOUNTER — Ambulatory Visit: Payer: Self-pay | Admitting: Pharmacist

## 2016-06-30 ENCOUNTER — Other Ambulatory Visit: Payer: Self-pay | Admitting: Pharmacist

## 2016-06-30 MED ORDER — DAPAGLIFLOZIN PROPANEDIOL 5 MG PO TABS: 5.0000 mg | ORAL_TABLET | Freq: Every day | ORAL | 0 refills | Status: DC

## 2016-07-26 ENCOUNTER — Other Ambulatory Visit: Payer: Self-pay | Admitting: Family Medicine

## 2016-08-09 ENCOUNTER — Other Ambulatory Visit: Payer: Self-pay | Admitting: Family Medicine

## 2016-08-28 ENCOUNTER — Other Ambulatory Visit: Payer: Self-pay | Admitting: Family Medicine

## 2016-08-29 NOTE — Telephone Encounter (Signed)
Patient aware and verbalizes understanding. 

## 2016-09-02 ENCOUNTER — Other Ambulatory Visit: Payer: Self-pay | Admitting: Family Medicine

## 2016-09-06 ENCOUNTER — Other Ambulatory Visit: Payer: Self-pay | Admitting: Family Medicine

## 2016-10-02 ENCOUNTER — Other Ambulatory Visit: Payer: Self-pay | Admitting: Family Medicine

## 2016-10-17 ENCOUNTER — Other Ambulatory Visit: Payer: Self-pay | Admitting: Family Medicine

## 2016-10-18 ENCOUNTER — Other Ambulatory Visit: Payer: Self-pay | Admitting: Family Medicine

## 2016-10-18 NOTE — Telephone Encounter (Signed)
lmtcb

## 2016-10-24 NOTE — Telephone Encounter (Signed)
Left a message, you must have an appointment and have protime checked or may not have further refills.

## 2016-11-14 ENCOUNTER — Ambulatory Visit (HOSPITAL_COMMUNITY): Payer: BLUE CROSS/BLUE SHIELD

## 2016-11-14 ENCOUNTER — Other Ambulatory Visit (HOSPITAL_COMMUNITY): Payer: BLUE CROSS/BLUE SHIELD

## 2016-11-17 ENCOUNTER — Other Ambulatory Visit: Payer: Self-pay | Admitting: Family Medicine

## 2016-12-12 ENCOUNTER — Other Ambulatory Visit: Payer: Self-pay | Admitting: Family Medicine

## 2016-12-13 NOTE — Telephone Encounter (Signed)
Patient's last visit December 2017

## 2017-01-11 ENCOUNTER — Other Ambulatory Visit: Payer: Self-pay | Admitting: *Deleted

## 2017-01-11 MED ORDER — LOSARTAN POTASSIUM 50 MG PO TABS: 50.0000 mg | ORAL_TABLET | Freq: Every day | ORAL | 0 refills | Status: DC

## 2017-01-12 ENCOUNTER — Other Ambulatory Visit: Payer: Self-pay | Admitting: Family Medicine

## 2017-01-19 ENCOUNTER — Other Ambulatory Visit: Payer: Self-pay | Admitting: Family Medicine

## 2017-01-20 NOTE — Telephone Encounter (Signed)
Last seen 04/12/16  Dr Wendi Snipes

## 2017-01-25 ENCOUNTER — Encounter: Payer: Self-pay | Admitting: Family Medicine

## 2017-01-25 ENCOUNTER — Ambulatory Visit (INDEPENDENT_AMBULATORY_CARE_PROVIDER_SITE_OTHER): Payer: Medicare Other | Admitting: Family Medicine

## 2017-01-25 VITALS — BP 158/87 | HR 101 | Temp 97.8°F | Ht 64.0 in | Wt 286.6 lb

## 2017-01-25 DIAGNOSIS — I10 Essential (primary) hypertension: Secondary | ICD-10-CM | POA: Diagnosis not present

## 2017-01-25 DIAGNOSIS — E1122 Type 2 diabetes mellitus with diabetic chronic kidney disease: Secondary | ICD-10-CM | POA: Diagnosis not present

## 2017-01-25 DIAGNOSIS — Z7901 Long term (current) use of anticoagulants: Secondary | ICD-10-CM | POA: Diagnosis not present

## 2017-01-25 DIAGNOSIS — Z23 Encounter for immunization: Secondary | ICD-10-CM | POA: Diagnosis not present

## 2017-01-25 DIAGNOSIS — I482 Chronic atrial fibrillation, unspecified: Secondary | ICD-10-CM

## 2017-01-25 DIAGNOSIS — IMO0002 Reserved for concepts with insufficient information to code with codable children: Secondary | ICD-10-CM

## 2017-01-25 DIAGNOSIS — E1165 Type 2 diabetes mellitus with hyperglycemia: Secondary | ICD-10-CM

## 2017-01-25 DIAGNOSIS — Z5181 Encounter for therapeutic drug level monitoring: Secondary | ICD-10-CM | POA: Diagnosis not present

## 2017-01-25 DIAGNOSIS — N183 Chronic kidney disease, stage 3 (moderate): Secondary | ICD-10-CM | POA: Diagnosis not present

## 2017-01-25 LAB — BAYER DCA HB A1C WAIVED: HB A1C (BAYER DCA - WAIVED): >14 % — ABNORMAL HIGH (ref <7.0)

## 2017-01-25 LAB — COAGUCHEK XS/INR WAIVED
INR: 1.8 — ABNORMAL HIGH (ref 0.9–1.1)
Prothrombin Time: 21.7 s

## 2017-01-25 MED ORDER — METOPROLOL TARTRATE 50 MG PO TABS: ORAL_TABLET | ORAL | 3 refills | Status: DC

## 2017-01-25 MED ORDER — LOSARTAN POTASSIUM 50 MG PO TABS: 50.0000 mg | ORAL_TABLET | Freq: Every day | ORAL | 3 refills | Status: DC

## 2017-01-25 MED ORDER — POTASSIUM CHLORIDE CRYS ER 20 MEQ PO TBCR: EXTENDED_RELEASE_TABLET | ORAL | 2 refills | Status: DC

## 2017-01-25 MED ORDER — ATORVASTATIN CALCIUM 20 MG PO TABS: ORAL_TABLET | ORAL | 3 refills | Status: DC

## 2017-01-25 MED ORDER — DAPAGLIFLOZIN PROPANEDIOL 5 MG PO TABS: 5.0000 mg | ORAL_TABLET | Freq: Every day | ORAL | 3 refills | Status: DC

## 2017-01-25 MED ORDER — OMEPRAZOLE 20 MG PO CPDR: DELAYED_RELEASE_CAPSULE | ORAL | 0 refills | Status: DC

## 2017-01-25 MED ORDER — DULAGLUTIDE 1.5 MG/0.5ML ~~LOC~~ SOAJ: 1.5000 mg | SUBCUTANEOUS | 3 refills | Status: DC

## 2017-01-25 MED ORDER — DILTIAZEM HCL ER COATED BEADS 180 MG PO CP24: 180.0000 mg | ORAL_CAPSULE | Freq: Every day | ORAL | 3 refills | Status: DC

## 2017-01-25 MED ORDER — WARFARIN SODIUM 5 MG PO TABS: ORAL_TABLET | ORAL | 5 refills | Status: DC

## 2017-01-25 MED ORDER — BLOOD GLUCOSE MONITOR KIT: PACK | 0 refills | Status: DC

## 2017-01-25 NOTE — Progress Notes (Signed)
HPI  Patient presents today for follow-up chronic medical conditions.  T2DM Pt has been ourt of medications for 2-3 months, he states ost of his meds.   He had a 6 month lase in insurance coverage.   He has not been checking his CBGs, he has been watching his diet.   HTN No HA or CP Has not been using losartan or metoprolol. Patient has not had fluid pills for about 4 days.  Atrial fibrillation Good Coumadin compliance, no bleeding No racing heart.  Patient would like immunization for influenza   PMH: Smoking status noted ROS: Per HPI  Objective: BP (!) 158/87   Pulse (!) 101   Temp 97.8 F (36.6 C) (Oral)   Ht 5' 4"  (1.626 m)   Wt 286 lb 9.6 oz (130 kg)   BMI 49.19 kg/m  Gen: NAD, alert, cooperative with exam HEENT: NCAT CV: RRR, good S1/S2, no murmur Resp: CTABL, no wheezes, non-labored Ext: No edema, warm Neuro: Alert and oriented, No gross deficits  Assessment and plan:  #2 diabetes Very uncontrolled, A1c greater than 14. Patient has not had insurance coverage for 6 months and states that he is been out of medications for 2-3 months. Restart farxiga and Trulicity Discussed that these 2 medications are possibly not strong enough to make up the difference between 14-8. May need basal insulin, follow-up 1 month with CBGs  #Atrial fibrillation Rate controlled, INR slightly subtherapeutic but shockingly close to normal after 10 months without INR check. Patient understands to return in 4 weeks for repeat INR, no changes for now  #Hypertension Uncontrolled, however without medications Refill meds Follow-up 1 month Labs  #Immunizations-counseling provided for all vaccines and vaccine components, influenza vaccine given today    Orders Placed This Encounter  Procedures  . Bayer DCA Hb A1c Waived  . CoaguChek XS/INR Waived  . CMP14+EGFR  . CBC with Differential/Platelet  . Lipid panel  . TSH    Meds ordered this encounter  Medications  .  atorvastatin (LIPITOR) 20 MG tablet    Sig: TAKE 1 TABLET (20 MG TOTAL) BY MOUTH DAILY AT 6 PM.    Dispense:  90 tablet    Refill:  3  . dapagliflozin propanediol (FARXIGA) 5 MG TABS tablet    Sig: Take 5 mg by mouth daily.    Dispense:  90 tablet    Refill:  3  . diltiazem (CARDIZEM CD) 180 MG 24 hr capsule    Sig: Take 1 capsule (180 mg total) by mouth daily.    Dispense:  90 capsule    Refill:  3  . Dulaglutide (TRULICITY) 1.5 VO/1.6WV SOPN    Sig: Inject 1.5 mg into the skin once a week.    Dispense:  12 pen    Refill:  3    Profile - patient has medication at home  . losartan (COZAAR) 50 MG tablet    Sig: Take 1 tablet (50 mg total) by mouth daily.    Dispense:  90 tablet    Refill:  3  . metoprolol tartrate (LOPRESSOR) 50 MG tablet    Sig: TAKE 1 TABLET (50 MG TOTAL) BY MOUTH 2 (TWO) TIMES DAILY.    Dispense:  180 tablet    Refill:  3  . omeprazole (PRILOSEC) 20 MG capsule    Sig: TAKE 1 CAPSULE (20 MG TOTAL) BY MOUTH DAILY.    Dispense:  90 capsule    Refill:  0  . potassium chloride SA (KLOR-CON M20)  20 MEQ tablet    Sig: TAKE 1/2 TABLET (10 MEQ TOTAL) BY MOUTH DAILY. NEED OV. *INS WILL COVER 4/19 OR LATER*    Dispense:  45 tablet    Refill:  2  . warfarin (COUMADIN) 5 MG tablet    Sig: As directed    Dispense:  60 tablet    Refill:  5  . blood glucose meter kit and supplies KIT    Sig: Dispense based on patient and insurance preference. Use up to four times daily as directed. (FOR ICD-9 250.00, 250.01).    Dispense:  1 each    Refill:  0    Order Specific Question:   Number of strips    Answer:   100    Order Specific Question:   Number of lancets    Answer:   [place], MD [facility] 01/25/2017, 9:39 AM   ]

## 2017-01-25 NOTE — Patient Instructions (Signed)
Great to see you!  Check your blood sugar daily and come back in 1 month.

## 2017-01-26 LAB — CMP14+EGFR
ALBUMIN: 3.7 g/dL (ref 3.6–4.8)
ALT: 35 IU/L (ref 0–44)
AST: 26 IU/L (ref 0–40)
Albumin/Globulin Ratio: 1.3 (ref 1.2–2.2)
Alkaline Phosphatase: 105 IU/L (ref 39–117)
BUN / CREAT RATIO: 13 (ref 10–24)
BUN: 15 mg/dL (ref 8–27)
Bilirubin Total: 0.4 mg/dL (ref 0.0–1.2)
CALCIUM: 9.6 mg/dL (ref 8.6–10.2)
CO2: 21 mmol/L (ref 20–29)
Chloride: 105 mmol/L (ref 96–106)
Creatinine, Ser: 1.2 mg/dL (ref 0.76–1.27)
GFR, EST AFRICAN AMERICAN: 73 mL/min/{1.73_m2} (ref >59)
GFR, EST NON AFRICAN AMERICAN: 63 mL/min/{1.73_m2} (ref >59)
Globulin, Total: 2.9 g/dL (ref 1.5–4.5)
Glucose: 318 mg/dL — ABNORMAL HIGH (ref 65–99)
Potassium: 4.8 mmol/L (ref 3.5–5.2)
Sodium: 140 mmol/L (ref 134–144)
TOTAL PROTEIN: 6.6 g/dL (ref 6.0–8.5)

## 2017-01-26 LAB — CBC WITH DIFFERENTIAL/PLATELET
BASOS: 1 %
Basophils Absolute: 0.1 10*3/uL (ref 0.0–0.2)
EOS (ABSOLUTE): 0.1 10*3/uL (ref 0.0–0.4)
EOS: 3 %
HEMATOCRIT: 44.1 % (ref 37.5–51.0)
HEMOGLOBIN: 14.5 g/dL (ref 13.0–17.7)
IMMATURE GRANS (ABS): 0 10*3/uL (ref 0.0–0.1)
Immature Granulocytes: 0 %
LYMPHS ABS: 1 10*3/uL (ref 0.7–3.1)
LYMPHS: 19 %
MCH: 30.7 pg (ref 26.6–33.0)
MCHC: 32.9 g/dL (ref 31.5–35.7)
MCV: 93 fL (ref 79–97)
MONOCYTES: 9 %
Monocytes Absolute: 0.5 10*3/uL (ref 0.1–0.9)
NEUTROS ABS: 3.4 10*3/uL (ref 1.4–7.0)
Neutrophils: 68 %
Platelets: 132 10*3/uL — ABNORMAL LOW (ref 150–379)
RBC: 4.73 x10E6/uL (ref 4.14–5.80)
RDW: 16.9 % — ABNORMAL HIGH (ref 12.3–15.4)
WBC: 5.1 10*3/uL (ref 3.4–10.8)

## 2017-01-26 LAB — TSH: TSH: 7.81 u[IU]/mL — ABNORMAL HIGH (ref 0.450–4.500)

## 2017-01-26 LAB — LIPID PANEL
CHOL/HDL RATIO: 6.8 ratio — AB (ref 0.0–5.0)
CHOLESTEROL TOTAL: 149 mg/dL (ref 100–199)
HDL: 22 mg/dL — ABNORMAL LOW (ref >39)
Triglycerides: 426 mg/dL — ABNORMAL HIGH (ref 0–149)

## 2017-01-27 ENCOUNTER — Telehealth: Payer: Self-pay

## 2017-01-27 MED ORDER — FUROSEMIDE 80 MG PO TABS: ORAL_TABLET | ORAL | 1 refills | Status: DC

## 2017-01-27 NOTE — Telephone Encounter (Signed)
Requesting refill of his furosemide- rx sent. Patient last seen 01/25/17.

## 2017-02-27 ENCOUNTER — Encounter: Payer: Self-pay | Admitting: Family Medicine

## 2017-02-27 ENCOUNTER — Ambulatory Visit (INDEPENDENT_AMBULATORY_CARE_PROVIDER_SITE_OTHER): Payer: Medicare Other | Admitting: Family Medicine

## 2017-02-27 VITALS — BP 149/75 | HR 74 | Temp 97.4°F | Ht 64.0 in | Wt 282.4 lb

## 2017-02-27 DIAGNOSIS — E1165 Type 2 diabetes mellitus with hyperglycemia: Secondary | ICD-10-CM | POA: Diagnosis not present

## 2017-02-27 DIAGNOSIS — I10 Essential (primary) hypertension: Secondary | ICD-10-CM

## 2017-02-27 DIAGNOSIS — IMO0002 Reserved for concepts with insufficient information to code with codable children: Secondary | ICD-10-CM

## 2017-02-27 DIAGNOSIS — Z5181 Encounter for therapeutic drug level monitoring: Secondary | ICD-10-CM

## 2017-02-27 DIAGNOSIS — Z7901 Long term (current) use of anticoagulants: Secondary | ICD-10-CM | POA: Diagnosis not present

## 2017-02-27 DIAGNOSIS — E1129 Type 2 diabetes mellitus with other diabetic kidney complication: Secondary | ICD-10-CM

## 2017-02-27 LAB — COAGUCHEK XS/INR WAIVED
INR: 2.7 — AB (ref 0.9–1.1)
Prothrombin Time: 32.6 s

## 2017-02-27 NOTE — Progress Notes (Signed)
   HPI  Patient presents today for follow-up of diabetes and for pro time.  Patient is tolerating start CIGA and Trulicity well. Average fasting blood sugar for the last 2 weeks has been less than 200. Patient is seeing association between his diet and fasting blood sugar. He is watching his diet  Chronic anticoagulation For atrial fibrillation No bleeding No change in diet.   PMH: Smoking status noted ROS: Per HPI  Objective: BP (!) 149/75   Pulse 74   Temp (!) 97.4 F (36.3 C) (Oral)   Ht 5' 4"  (1.626 m)   Wt 282 lb 6.4 oz (128.1 kg)   BMI 48.47 kg/m  Gen: NAD, alert, cooperative with exam HEENT: NCAT CV: RRR, good S1/S2, no murmur Resp: CTABL, no wheezes, non-labored Ext: No edema, warm Neuro: Alert and oriented, No gross deficits  Assessment and plan:  #Atrial fibrillation, chronic anticoagulation Rate controlled, INR theraputic, No changes - typical f/u is 6 weeks but will do 8 weeks for INR as well.   #Type 2 diabetes Control improving, fasting blood sugars are much more consistent with his previous status Follow-up 2 months for A1c. No changes  HTN Improved slightly with weight, no changes for now. Continue to follow closely, follow-up 2 months   Orders Placed This Encounter  Procedures  . CoaguChek XS/INR Carney Bern, MD [facility] 02/27/2017, 11:13 AM

## 2017-03-10 ENCOUNTER — Encounter: Payer: Self-pay | Admitting: *Deleted

## 2017-03-17 ENCOUNTER — Other Ambulatory Visit: Payer: Self-pay | Admitting: *Deleted

## 2017-03-17 MED ORDER — METFORMIN HCL 1000 MG PO TABS
500.0000 mg | ORAL_TABLET | Freq: Two times a day (BID) | ORAL | 0 refills | Status: DC
Start: 2017-03-17 — End: 2017-06-20

## 2017-03-17 NOTE — Telephone Encounter (Signed)
LMOVM to verify dosage patient is taking

## 2017-03-22 ENCOUNTER — Other Ambulatory Visit: Payer: Self-pay | Admitting: Family Medicine

## 2017-04-21 ENCOUNTER — Other Ambulatory Visit: Payer: Self-pay | Admitting: Family Medicine

## 2017-04-27 ENCOUNTER — Ambulatory Visit: Payer: Medicare Other | Admitting: Family Medicine

## 2017-05-01 ENCOUNTER — Ambulatory Visit (INDEPENDENT_AMBULATORY_CARE_PROVIDER_SITE_OTHER): Payer: Medicare Other | Admitting: Family Medicine

## 2017-05-01 ENCOUNTER — Encounter: Payer: Self-pay | Admitting: Family Medicine

## 2017-05-01 VITALS — BP 146/78 | HR 75 | Temp 97.0°F | Ht 64.0 in | Wt 287.2 lb

## 2017-05-01 DIAGNOSIS — I4891 Unspecified atrial fibrillation: Secondary | ICD-10-CM

## 2017-05-01 DIAGNOSIS — IMO0002 Reserved for concepts with insufficient information to code with codable children: Secondary | ICD-10-CM

## 2017-05-01 DIAGNOSIS — Z7901 Long term (current) use of anticoagulants: Secondary | ICD-10-CM | POA: Diagnosis not present

## 2017-05-01 DIAGNOSIS — E1129 Type 2 diabetes mellitus with other diabetic kidney complication: Secondary | ICD-10-CM

## 2017-05-01 DIAGNOSIS — G4733 Obstructive sleep apnea (adult) (pediatric): Secondary | ICD-10-CM

## 2017-05-01 DIAGNOSIS — E1165 Type 2 diabetes mellitus with hyperglycemia: Secondary | ICD-10-CM

## 2017-05-01 LAB — COAGUCHEK XS/INR WAIVED
INR: 2.2 — ABNORMAL HIGH (ref 0.9–1.1)
Prothrombin Time: 26.8 s

## 2017-05-01 LAB — BAYER DCA HB A1C WAIVED: HB A1C: 10.4 % — AB (ref <7.0)

## 2017-05-01 NOTE — Patient Instructions (Signed)
Great to see you!  Come back in 6 weeks for INR check  You will be called to see Dr. Hartford Poli or Steffanie Dunn with endocrinology

## 2017-05-01 NOTE — Progress Notes (Addendum)
   HPI  Patient presents today for follow-up chronic medical conditions.  Diabetes Good medication compliance and tolerance. Average fasting blood sugar is around 200 250 No hypoglycemia Tolerating Trulicity very well.  He is watching his diet, however only moderately watching it.  Patient describes his Coumadin dosing easily taking 1-1/2 tablets every day except for Tuesday and Thursday when he takes 1 tablet daily. No bleeding, no diet changes  A. fib No palpitations  PMH: Smoking status noted ROS: Per HPI  Objective: BP (!) 146/78   Pulse 75   Temp (!) 97 F (36.1 C) (Oral)   Ht 5' 4" (1.626 m)   Wt 287 lb 3.2 oz (130.3 kg)   BMI 49.30 kg/m  Gen: NAD, alert, cooperative with exam HEENT: NCAT CV: No murmur appreciated Resp: CTABL, no wheezes, non-labored Ext: No edema, warm Neuro: Alert and oriented, No gross deficits  Assessment and plan:  #Anticoagulation on Coumadin, atrial fibrillation Rate controlled, INR is therapeutic No changes Repeat INR in 6 weeks  #Type 2 diabetes Uncontrolled, A1c worsening Patient's on aggressive non-insulin medication, likely needs insulin Endocrinology referral placed  OSA,  chronic respiratory failure with  Using CPAP Pt requires extra assistance with ventilation using a CPAP due to chronic respiratory failure.  Without this assistance there is a risk of increased CO2 retention and hospitalization due to respiratory failure.   Orders Placed This Encounter  Procedures  . Bayer DCA Hb A1c Waived  . CoaguChek XS/INR Waived  . CMP14+EGFR  . CBC with Differential/Platelet  . LDL Cholesterol, Direct  . Ambulatory referral to Endocrinology    Referral Priority:   Routine    Referral Type:   Consultation    Referral Reason:   Specialty Services Required    Number of Visits Requested:   1     Sam Bradshaw, MD [facility] 05/01/2017, 3:15 PM     

## 2017-05-02 ENCOUNTER — Telehealth: Payer: Self-pay | Admitting: Family Medicine

## 2017-05-02 LAB — CBC WITH DIFFERENTIAL/PLATELET
BASOS ABS: 0.1 10*3/uL (ref 0.0–0.2)
Basos: 1 %
EOS (ABSOLUTE): 0.2 10*3/uL (ref 0.0–0.4)
EOS: 3 %
HEMATOCRIT: 47.8 % (ref 37.5–51.0)
Hemoglobin: 15.2 g/dL (ref 13.0–17.7)
Immature Grans (Abs): 0 10*3/uL (ref 0.0–0.1)
Immature Granulocytes: 1 %
LYMPHS ABS: 1.4 10*3/uL (ref 0.7–3.1)
Lymphs: 22 %
MCH: 29.5 pg (ref 26.6–33.0)
MCHC: 31.8 g/dL (ref 31.5–35.7)
MCV: 93 fL (ref 79–97)
MONOS ABS: 0.4 10*3/uL (ref 0.1–0.9)
Monocytes: 7 %
Neutrophils Absolute: 4.2 10*3/uL (ref 1.4–7.0)
Neutrophils: 66 %
PLATELETS: 146 10*3/uL — AB (ref 150–379)
RBC: 5.15 x10E6/uL (ref 4.14–5.80)
RDW: 17.5 % — AB (ref 12.3–15.4)
WBC: 6.4 10*3/uL (ref 3.4–10.8)

## 2017-05-02 LAB — CMP14+EGFR
ALT: 45 IU/L — AB (ref 0–44)
AST: 52 IU/L — AB (ref 0–40)
Albumin/Globulin Ratio: 1.3 (ref 1.2–2.2)
Albumin: 3.9 g/dL (ref 3.6–4.8)
Alkaline Phosphatase: 100 IU/L (ref 39–117)
BUN/Creatinine Ratio: 20 (ref 10–24)
BUN: 28 mg/dL — ABNORMAL HIGH (ref 8–27)
Bilirubin Total: 0.3 mg/dL (ref 0.0–1.2)
CALCIUM: 9.3 mg/dL (ref 8.6–10.2)
CHLORIDE: 104 mmol/L (ref 96–106)
CO2: 22 mmol/L (ref 20–29)
Creatinine, Ser: 1.43 mg/dL — ABNORMAL HIGH (ref 0.76–1.27)
GFR calc non Af Amer: 51 mL/min/{1.73_m2} — ABNORMAL LOW (ref >59)
GFR, EST AFRICAN AMERICAN: 59 mL/min/{1.73_m2} — AB (ref >59)
GLUCOSE: 309 mg/dL — AB (ref 65–99)
Globulin, Total: 3 g/dL (ref 1.5–4.5)
Potassium: 4 mmol/L (ref 3.5–5.2)
SODIUM: 141 mmol/L (ref 134–144)
Total Protein: 6.9 g/dL (ref 6.0–8.5)

## 2017-05-02 LAB — LDL CHOLESTEROL, DIRECT: LDL Direct: 57 mg/dL (ref 0–99)

## 2017-05-02 NOTE — Telephone Encounter (Signed)
Done and faxed. Patient aware.

## 2017-05-02 NOTE — Telephone Encounter (Signed)
Will ask nursing for help, I am ok with Rxs but this may not be easy with a sleep study in 2009 that I see.   Laroy Apple, MD [facility] 05/02/2017, 1:55 PM

## 2017-05-18 DIAGNOSIS — I129 Hypertensive chronic kidney disease with stage 1 through stage 4 chronic kidney disease, or unspecified chronic kidney disease: Secondary | ICD-10-CM | POA: Diagnosis not present

## 2017-05-18 DIAGNOSIS — N183 Chronic kidney disease, stage 3 (moderate): Secondary | ICD-10-CM | POA: Diagnosis not present

## 2017-05-18 DIAGNOSIS — E1165 Type 2 diabetes mellitus with hyperglycemia: Secondary | ICD-10-CM | POA: Diagnosis not present

## 2017-05-18 DIAGNOSIS — E1122 Type 2 diabetes mellitus with diabetic chronic kidney disease: Secondary | ICD-10-CM | POA: Diagnosis not present

## 2017-05-18 DIAGNOSIS — Z794 Long term (current) use of insulin: Secondary | ICD-10-CM | POA: Diagnosis not present

## 2017-05-18 DIAGNOSIS — Z6841 Body Mass Index (BMI) 40.0 and over, adult: Secondary | ICD-10-CM | POA: Diagnosis not present

## 2017-05-18 DIAGNOSIS — E785 Hyperlipidemia, unspecified: Secondary | ICD-10-CM | POA: Diagnosis not present

## 2017-05-18 DIAGNOSIS — E1169 Type 2 diabetes mellitus with other specified complication: Secondary | ICD-10-CM | POA: Insufficient documentation

## 2017-05-18 DIAGNOSIS — E782 Mixed hyperlipidemia: Secondary | ICD-10-CM | POA: Insufficient documentation

## 2017-05-18 DIAGNOSIS — E039 Hypothyroidism, unspecified: Secondary | ICD-10-CM | POA: Diagnosis not present

## 2017-05-25 ENCOUNTER — Other Ambulatory Visit: Payer: Self-pay | Admitting: Family Medicine

## 2017-06-13 ENCOUNTER — Encounter: Payer: Self-pay | Admitting: Family Medicine

## 2017-06-13 ENCOUNTER — Other Ambulatory Visit: Payer: Self-pay | Admitting: Family Medicine

## 2017-06-13 ENCOUNTER — Ambulatory Visit (INDEPENDENT_AMBULATORY_CARE_PROVIDER_SITE_OTHER): Payer: Medicare Other | Admitting: Family Medicine

## 2017-06-13 VITALS — BP 160/88 | HR 73 | Temp 97.7°F | Ht 64.0 in | Wt 287.8 lb

## 2017-06-13 DIAGNOSIS — I10 Essential (primary) hypertension: Secondary | ICD-10-CM | POA: Diagnosis not present

## 2017-06-13 DIAGNOSIS — Z7901 Long term (current) use of anticoagulants: Secondary | ICD-10-CM | POA: Diagnosis not present

## 2017-06-13 LAB — COAGUCHEK XS/INR WAIVED
INR: 2.8 — AB (ref 0.9–1.1)
Prothrombin Time: 33.1 s

## 2017-06-13 MED ORDER — AZELASTINE HCL 0.05 % OP SOLN: 2.0000 [drp] | Freq: Two times a day (BID) | OPHTHALMIC | 5 refills | Status: DC

## 2017-06-13 NOTE — Progress Notes (Signed)
   HPI  Patient presents today for follow-up chronic medical conditions.  Hypertension Patient reports 161W and 96E systolic blood pressure at home Good medication compliance, no headache or chest pain.  Chronic anticoagulation Describes his Coumadin regimen Due to A. fib   PMH: Smoking status noted ROS: Per HPI  Objective: BP (!) 160/88   Pulse 73   Temp 97.7 F (36.5 C) (Oral)   Ht 5' 4"  (1.626 m)   Wt 287 lb 12.8 oz (130.5 kg)   BMI 49.40 kg/m  Gen: NAD, alert, cooperative with exam HEENT: NCAT, EOMI, PERRL CV: RRR, good S1/S2, no murmur Resp: CTABL, no wheezes, non-labored Abd: SNTND, BS present, no guarding or organomegaly Ext: No edema, warm Neuro: Alert and oriented, No gross deficits  Assessment and plan:  #Chronic anticoagulation INR is therapeutic today, no changes   #Hypertension Elevated today, patient reports normal blood pressures at home, he is given a log and asked to come back in 6 weeks for follow-up Continue losartan, Toprol, diltiazem   Type 2 diabetes-patient now following with endocrinology, he is receiving very good care from them, appreciate the recommendations and management.  Orders Placed This Encounter  Procedures  . [facility] Hb A1c Carney Bern, MD [facility] 06/13/2017, 9:12 AM    \

## 2017-06-13 NOTE — Patient Instructions (Signed)
Great to see you!   Come back in 6 weeks to see Dr. Warrick Parisian, bring a blood pressure log with you.

## 2017-06-13 NOTE — Addendum Note (Signed)
Addended by: Karle Plumber on: 06/13/2017 09:56 AM   Modules accepted: Orders

## 2017-06-20 ENCOUNTER — Other Ambulatory Visit: Payer: Self-pay | Admitting: Family Medicine

## 2017-07-12 ENCOUNTER — Ambulatory Visit: Payer: Self-pay | Admitting: Registered"

## 2017-07-17 ENCOUNTER — Other Ambulatory Visit: Payer: Self-pay | Admitting: Family Medicine

## 2017-07-30 ENCOUNTER — Other Ambulatory Visit: Payer: Self-pay | Admitting: Family Medicine

## 2017-07-31 ENCOUNTER — Encounter: Payer: Self-pay | Admitting: Family Medicine

## 2017-07-31 ENCOUNTER — Ambulatory Visit (INDEPENDENT_AMBULATORY_CARE_PROVIDER_SITE_OTHER): Payer: Medicare Other | Admitting: Family Medicine

## 2017-07-31 VITALS — BP 139/78 | HR 82 | Temp 97.4°F | Ht 64.0 in | Wt 288.0 lb

## 2017-07-31 DIAGNOSIS — I4891 Unspecified atrial fibrillation: Secondary | ICD-10-CM | POA: Diagnosis not present

## 2017-07-31 DIAGNOSIS — Z7901 Long term (current) use of anticoagulants: Secondary | ICD-10-CM

## 2017-07-31 LAB — COAGUCHEK XS/INR WAIVED
INR: 2.2 — AB (ref 0.9–1.1)
PROTHROMBIN TIME: 26.4 s

## 2017-07-31 MED ORDER — FUROSEMIDE 80 MG PO TABS: ORAL_TABLET | ORAL | 2 refills | Status: DC

## 2017-07-31 NOTE — Patient Instructions (Signed)
Description   Continue current warfarin 88m tablet dose  - Take 1 tablet on Tuesdays and Thursdays.  Take 1 and 1/2 tablets all other day.  INR was 2.2 today

## 2017-07-31 NOTE — Progress Notes (Signed)
BP 139/78   Pulse 82   Temp (!) 97.4 F (36.3 C) (Oral)   Ht 5' 4"  (1.626 m)   Wt 288 lb (130.6 kg)   BMI 49.44 kg/m    Subjective:    Patient ID: William Flores, male    DOB: 10-Nov-1950, 67 y.o.   MRN: [id number]  HPI: William Flores is a 67 y.o. male presenting on 07/31/2017 for Coagulation Disorder   HPI A. fib and INR recheck Patient is coming in today to establish care with me as his provider and for A. fib and INR recheck.  He denies any chest pain or palpitations.  He denies any bleeding or abnormal bruising  Relevant past medical, surgical, family and social history reviewed and updated as indicated. Interim medical history since our last visit reviewed. Allergies and medications reviewed and updated.  Review of Systems  Constitutional: Negative for chills and fever.  Respiratory: Negative for shortness of breath and wheezing.   Cardiovascular: Negative for chest pain and leg swelling.  Gastrointestinal: Negative for anal bleeding.  Genitourinary: Negative for hematuria.  Skin: Negative for color change and rash.  All other systems reviewed and are negative.   Per HPI unless specifically indicated above   Allergies as of 07/31/2017      Reactions   Codeine    Headache      Medication List        Accurate as of 07/31/17 11:08 AM. Always use your most recent med list.          allopurinol 300 MG tablet Commonly known as:  ZYLOPRIM TAKE 1 TABLET BY MOUTH EVERY DAY   atorvastatin 20 MG tablet Commonly known as:  LIPITOR TAKE 1 TABLET (20 MG TOTAL) BY MOUTH DAILY AT 6 PM.   azelastine 0.05 % ophthalmic solution Commonly known as:  OPTIVAR Place 2 drops into both eyes 2 (two) times daily. Reported on 01/06/2015   blood glucose meter kit and supplies Kit Dispense based on patient and insurance preference. Use up to four times daily as directed. (FOR ICD-9 250.00, 250.01).   CARTIA XT 180 MG 24 hr capsule Generic drug:  diltiazem TAKE 1 CAPSULE BY  MOUTH ONCE DAILY   diltiazem 180 MG 24 hr capsule Commonly known as:  CARDIZEM CD Take 1 capsule (180 mg total) by mouth daily.   colchicine 0.6 MG tablet Take 2 tablets st first sign of gout flare. May take an additional tablet in 1 hour if needed.  Max of 3 tablets in 24 hours.   dapagliflozin propanediol 5 MG Tabs tablet Commonly known as:  FARXIGA Take 5 mg by mouth daily.   Dulaglutide 1.5 MG/0.5ML Sopn Commonly known as:  TRULICITY Inject 1.5 mg into the skin once a week.   ferrous sulfate 325 (65 FE) MG EC tablet Take 325 mg by mouth 2 (two) times daily.   furosemide 80 MG tablet Commonly known as:  LASIX TAKE 1 TABLET IN THE MORNING AND 1/2 TABLET IN THE EVENING AS NEEDED   glucose blood test strip Use to check BG daily.  DX: type 2 diabetes without insulin use.   losartan 50 MG tablet Commonly known as:  COZAAR Take 1 tablet (50 mg total) by mouth daily.   metFORMIN 1000 MG tablet Commonly known as:  GLUCOPHAGE TAKE 0.5 TABLETS (500 MG TOTAL) BY MOUTH 2 (TWO) TIMES DAILY WITH A MEAL.   metoprolol tartrate 50 MG tablet Commonly known as:  LOPRESSOR TAKE 1 TABLET (50  MG TOTAL) BY MOUTH 2 (TWO) TIMES DAILY.   MICROLET LANCETS Misc USE TO CHECK BLOOD GLUCOSE ONCE DAILY   omeprazole 20 MG capsule Commonly known as:  PRILOSEC TAKE 1 CAPSULE BY MOUTH EVERY DAY   polyvinyl alcohol 1.4 % ophthalmic solution Commonly known as:  LIQUIFILM TEARS Place 1 drop into both eyes as needed for dry eyes.   potassium chloride SA 20 MEQ tablet Commonly known as:  KLOR-CON M20 TAKE 1/2 TABLET (10 MEQ TOTAL) BY MOUTH DAILY. NEED OV. *INS WILL COVER 4/19 OR LATER*   warfarin 5 MG tablet Commonly known as:  COUMADIN Take as directed by the anticoagulation clinic. If you are unsure how to take this medication, talk to your nurse or doctor. Original instructions:  As directed          Objective:    BP 139/78   Pulse 82   Temp (!) 97.4 F (36.3 C) (Oral)   Ht 5' 4"   (1.626 m)   Wt 288 lb (130.6 kg)   BMI 49.44 kg/m   Wt Readings from Last 3 Encounters:  07/31/17 288 lb (130.6 kg)  06/13/17 287 lb 12.8 oz (130.5 kg)  05/01/17 287 lb 3.2 oz (130.3 kg)    Physical Exam  Constitutional: He is oriented to person, place, and time. He appears well-developed and well-nourished. No distress.  Eyes: Conjunctivae are normal. No scleral icterus.  Neck: Neck supple. No thyromegaly present.  Cardiovascular: Normal rate, regular rhythm, normal heart sounds and intact distal pulses.  No murmur heard. Pulmonary/Chest: Effort normal and breath sounds normal. No respiratory distress. He has no wheezes.  Musculoskeletal: Normal range of motion. He exhibits no edema.  Lymphadenopathy:    He has no cervical adenopathy.  Neurological: He is alert and oriented to person, place, and time. Coordination normal.  Skin: Skin is warm and dry. No rash noted. He is not diaphoretic.  Psychiatric: He has a normal mood and affect. His behavior is normal.  Nursing note and vitals reviewed.   Description   Continue current warfarin 72m tablet dose  - Take 1 tablet on Tuesdays and Thursdays.  Take 1 and 1/2 tablets all other day.  INR was 2.2 today          Assessment & Plan:   Problem List Items Addressed This Visit      Cardiovascular and Mediastinum   Atrial fibrillation with slow ventricular response (HCC) - Primary   Relevant Medications   furosemide (LASIX) 80 MG tablet     Other   Anticoagulated on Coumadin (Chronic)   Relevant Orders   Protime-INR       Follow up plan: Return in about 1 month (around 08/28/2017), or if symptoms worsen or fail to improve.  Counseling provided for all of the vaccine components Orders Placed This Encounter  Procedures  . Protime-INR    JCaryl Pina MD [facility] 07/31/2017, 11:08 AM

## 2017-08-03 ENCOUNTER — Other Ambulatory Visit: Payer: Self-pay | Admitting: Family Medicine

## 2017-08-03 DIAGNOSIS — E119 Type 2 diabetes mellitus without complications: Secondary | ICD-10-CM

## 2017-08-03 DIAGNOSIS — Z794 Long term (current) use of insulin: Principal | ICD-10-CM

## 2017-08-09 ENCOUNTER — Other Ambulatory Visit: Payer: Self-pay | Admitting: Family Medicine

## 2017-08-10 ENCOUNTER — Ambulatory Visit (INDEPENDENT_AMBULATORY_CARE_PROVIDER_SITE_OTHER): Payer: Medicare Other | Admitting: Family Medicine

## 2017-08-10 ENCOUNTER — Encounter: Payer: Self-pay | Admitting: Family Medicine

## 2017-08-10 VITALS — BP 142/77 | HR 73 | Temp 97.5°F | Ht 64.0 in | Wt 288.0 lb

## 2017-08-10 DIAGNOSIS — D492 Neoplasm of unspecified behavior of bone, soft tissue, and skin: Secondary | ICD-10-CM

## 2017-08-10 MED ORDER — INSULIN GLARGINE 300 UNIT/ML ~~LOC~~ SOPN
26.0000 [IU] | PEN_INJECTOR | Freq: Every day | SUBCUTANEOUS | 3 refills | Status: DC
Start: 2017-08-10 — End: 2017-08-11

## 2017-08-10 NOTE — Progress Notes (Signed)
   BP (!) 142/77   Pulse 73   Temp (!) 97.5 F (36.4 C) (Oral)   Ht 5' 4"  (1.626 m)   Wt 288 lb (130.6 kg)   BMI 49.44 kg/m    Subjective:    Patient ID: William Flores, male    DOB: 09-26-1950, 67 y.o.   MRN: [id number]  HPI: William Flores is a 67 y.o. male presenting on 08/10/2017 for wart removed? (Left arm x 6-7 months)   HPI Left arm skin lesion Patient is coming in for left arm skin lesion that has been catching on his shirt and bleeding at times and is just not going away and he has had it for at least what he feels like his one year or more.  Relevant past medical, surgical, family and social history reviewed and updated as indicated. Interim medical history since our last visit reviewed. Allergies and medications reviewed and updated.  Review of Systems  Constitutional: Negative for chills and fever.  Eyes: Negative for discharge.  Skin: Positive for rash.  All other systems reviewed and are negative.   Per HPI unless specifically indicated above      Objective:    BP (!) 142/77   Pulse 73   Temp (!) 97.5 F (36.4 C) (Oral)   Ht 5' 4"  (1.626 m)   Wt 288 lb (130.6 kg)   BMI 49.44 kg/m   Wt Readings from Last 3 Encounters:  08/10/17 288 lb (130.6 kg)  07/31/17 288 lb (130.6 kg)  06/13/17 287 lb 12.8 oz (130.5 kg)    Physical Exam  Constitutional: He is oriented to person, place, and time. He appears well-developed and well-nourished. No distress.  Eyes: Conjunctivae are normal. No scleral icterus.  Neck: Neck supple. No thyromegaly present.  Lymphadenopathy:    He has no cervical adenopathy.  Neurological: He is alert and oriented to person, place, and time. Coordination normal.  Skin: Skin is warm and dry. Lesion noted. No rash noted. He is not diaphoretic.     Psychiatric: He has a normal mood and affect. His behavior is normal.  Nursing note and vitals reviewed.   Skin lesion cryotherapy:  Used liquid nitrogen in 3-10-second bursts on wart.   Patient tolerated well, gave instructions on what to watch for blistering and keeping the wound site clean.     Assessment & Plan:   Problem List Items Addressed This Visit    None    Visit Diagnoses    Atypical squamoproliferative skin lesion    -  Primary   Left forearm, did cryotherapy on it       Follow up plan: Return if symptoms worsen or fail to improve, for Return in 3 weeks if needed for repeat treatment.  Counseling provided for all of the vaccine components No orders of the defined types were placed in this encounter.   Caryl Pina, MD [facility] 08/10/2017, 3:07 PM

## 2017-08-11 ENCOUNTER — Other Ambulatory Visit: Payer: Self-pay | Admitting: Family Medicine

## 2017-08-14 ENCOUNTER — Encounter: Payer: Self-pay | Admitting: Family Medicine

## 2017-08-14 ENCOUNTER — Telehealth: Payer: Self-pay | Admitting: Family Medicine

## 2017-08-15 MED ORDER — INSULIN DEGLUDEC 100 UNIT/ML ~~LOC~~ SOPN: 26.0000 [IU] | PEN_INJECTOR | Freq: Every day | SUBCUTANEOUS | 2 refills | Status: DC

## 2017-08-15 NOTE — Telephone Encounter (Signed)
Please send in script for his diabetes that insurance can cover.

## 2017-08-15 NOTE — Telephone Encounter (Signed)
Pt aware new rx sent in.

## 2017-08-15 NOTE — Telephone Encounter (Signed)
[place] changed to [place]. We will see if insurance will cover this.

## 2017-09-22 ENCOUNTER — Other Ambulatory Visit: Payer: Self-pay | Admitting: *Deleted

## 2017-09-22 MED ORDER — ALLOPURINOL 300 MG PO TABS: 300.0000 mg | ORAL_TABLET | Freq: Every day | ORAL | 0 refills | Status: DC

## 2017-09-22 MED ORDER — METFORMIN HCL 1000 MG PO TABS: 500.0000 mg | ORAL_TABLET | Freq: Two times a day (BID) | ORAL | 0 refills | Status: DC

## 2017-10-22 ENCOUNTER — Other Ambulatory Visit: Payer: Self-pay | Admitting: Family Medicine

## 2017-10-23 ENCOUNTER — Other Ambulatory Visit: Payer: Self-pay | Admitting: Family Medicine

## 2017-10-25 DIAGNOSIS — Z23 Encounter for immunization: Secondary | ICD-10-CM | POA: Diagnosis not present

## 2017-10-30 ENCOUNTER — Encounter: Payer: Self-pay | Admitting: Gastroenterology

## 2017-11-14 ENCOUNTER — Other Ambulatory Visit: Payer: Self-pay | Admitting: *Deleted

## 2017-11-14 MED ORDER — WARFARIN SODIUM 5 MG PO TABS: ORAL_TABLET | ORAL | 0 refills | Status: DC

## 2017-11-20 ENCOUNTER — Other Ambulatory Visit: Payer: Self-pay | Admitting: *Deleted

## 2017-11-20 MED ORDER — LOSARTAN POTASSIUM 50 MG PO TABS: 50.0000 mg | ORAL_TABLET | Freq: Every day | ORAL | 0 refills | Status: DC

## 2017-11-21 ENCOUNTER — Other Ambulatory Visit: Payer: Self-pay | Admitting: Family Medicine

## 2017-12-01 ENCOUNTER — Encounter: Payer: Self-pay | Admitting: Family Medicine

## 2017-12-01 ENCOUNTER — Ambulatory Visit (INDEPENDENT_AMBULATORY_CARE_PROVIDER_SITE_OTHER): Payer: Medicare Other | Admitting: Family Medicine

## 2017-12-01 VITALS — BP 170/90 | Temp 97.0°F | Ht 64.0 in | Wt 285.0 lb

## 2017-12-01 DIAGNOSIS — E785 Hyperlipidemia, unspecified: Secondary | ICD-10-CM

## 2017-12-01 DIAGNOSIS — I4891 Unspecified atrial fibrillation: Secondary | ICD-10-CM | POA: Diagnosis not present

## 2017-12-01 DIAGNOSIS — IMO0002 Reserved for concepts with insufficient information to code with codable children: Secondary | ICD-10-CM

## 2017-12-01 DIAGNOSIS — Z7901 Long term (current) use of anticoagulants: Secondary | ICD-10-CM

## 2017-12-01 DIAGNOSIS — E1122 Type 2 diabetes mellitus with diabetic chronic kidney disease: Secondary | ICD-10-CM | POA: Diagnosis not present

## 2017-12-01 DIAGNOSIS — E1129 Type 2 diabetes mellitus with other diabetic kidney complication: Secondary | ICD-10-CM | POA: Diagnosis not present

## 2017-12-01 DIAGNOSIS — E1169 Type 2 diabetes mellitus with other specified complication: Secondary | ICD-10-CM | POA: Diagnosis not present

## 2017-12-01 DIAGNOSIS — I10 Essential (primary) hypertension: Secondary | ICD-10-CM

## 2017-12-01 DIAGNOSIS — N183 Chronic kidney disease, stage 3 (moderate): Secondary | ICD-10-CM

## 2017-12-01 DIAGNOSIS — E1165 Type 2 diabetes mellitus with hyperglycemia: Secondary | ICD-10-CM

## 2017-12-01 LAB — COAGUCHEK XS/INR WAIVED
INR: 2.3 — ABNORMAL HIGH (ref 0.9–1.1)
PROTHROMBIN TIME: 27.9 s

## 2017-12-01 LAB — BAYER DCA HB A1C WAIVED: HB A1C (BAYER DCA - WAIVED): 8.1 % — ABNORMAL HIGH (ref <7.0)

## 2017-12-01 NOTE — Progress Notes (Signed)
BP (!) 170/90   Temp (!) 97 F (36.1 C)   Ht 5' 4"  (1.626 m)   Wt 285 lb (129.3 kg)   BMI 48.92 kg/m    Subjective:    Patient ID: William Flores, male    DOB: 1950/01/16, 67 y.o.   MRN: [id number]  HPI: William Flores is a 67 y.o. male presenting on 12/01/2017 for Medical Management of Chronic Issues   HPI Hyperlipidemia Patient is coming in for recheck of his hyperlipidemia. The patient is currently taking Lipitor. They deny any issues with myalgias or history of liver damage from it. They deny any focal numbness or weakness or chest pain.   Type 2 diabetes mellitus Patient comes in today for recheck of his diabetes. Patient has been currently taking Tresiba 26 and [place] and Trulicity and metformin. Patient is currently on an ACE inhibitor/ARB. Patient has not seen an ophthalmologist this year. Patient denies any issues with their feet.  Patient does see an endocrinologist for this but has not seen them in some time so we will check the A1c today.  Patient says his blood sugars been running usually in the 130s in the morning and usually in the 150s to 180s in the evening, he did get 204 last night but that was after Thanksgiving.  Hypertension Patient is currently on losartan and metoprolol and Cardizem, and their blood pressure today is 170/90, patient does admit that he ate a lot of extra ham and salty things yesterday because of Thanksgiving yesterday, will not adjust for today but will see where he is at the next time he comes in. Patient denies any lightheadedness or dizziness. Patient denies headaches, blurred vision, chest pains, shortness of breath, or weakness. Denies any side effects from medication and is content with current medication.   A. fib and on Coumadin Patient is coming in for A. fib and Coumadin recheck, is been 4 months since he had his Coumadin recheck that was good then will be rechecked today and it was 2.3 which is within range, he is not had any bleeding  or palpitations or flutters or chest pain.  Relevant past medical, surgical, family and social history reviewed and updated as indicated. Interim medical history since our last visit reviewed. Allergies and medications reviewed and updated.  Review of Systems  Constitutional: Negative for chills and fever.  Eyes: Negative for visual disturbance.  Respiratory: Negative for shortness of breath and wheezing.   Cardiovascular: Negative for chest pain and leg swelling.  Musculoskeletal: Negative for back pain and gait problem.  Skin: Negative for rash.  Neurological: Negative for dizziness, weakness, light-headedness and numbness.  All other systems reviewed and are negative.   Per HPI unless specifically indicated above   Allergies as of 12/01/2017      Reactions   Codeine    Headache      Medication List        Accurate as of 12/01/17  9:39 AM. Always use your most recent med list.          allopurinol 300 MG tablet Commonly known as:  ZYLOPRIM Take 1 tablet (300 mg total) by mouth daily.   atorvastatin 20 MG tablet Commonly known as:  LIPITOR TAKE 1 TABLET (20 MG TOTAL) BY MOUTH DAILY AT 6 PM.   azelastine 0.05 % ophthalmic solution Commonly known as:  OPTIVAR Place 2 drops into both eyes 2 (two) times daily. Reported on 01/06/2015   blood glucose meter kit and supplies  Kit Dispense based on patient and insurance preference. Use up to four times daily as directed. (FOR ICD-9 250.00, 250.01).   CARTIA XT 180 MG 24 hr capsule Generic drug:  diltiazem TAKE 1 CAPSULE BY MOUTH ONCE DAILY   diltiazem 180 MG 24 hr capsule Commonly known as:  CARDIZEM CD Take 1 capsule (180 mg total) by mouth daily.   colchicine 0.6 MG tablet Take 2 tablets st first sign of gout flare. May take an additional tablet in 1 hour if needed.  Max of 3 tablets in 24 hours.   CONTOUR NEXT TEST test strip Generic drug:  glucose blood USE TO CHECK BG DAILY   dapagliflozin propanediol 5 MG  Tabs tablet Commonly known as:  FARXIGA Take 5 mg by mouth daily.   Dulaglutide 1.5 MG/0.5ML Sopn Inject 1.5 mg into the skin once a week.   ferrous sulfate 325 (65 FE) MG EC tablet Take 325 mg by mouth 2 (two) times daily.   furosemide 80 MG tablet Commonly known as:  LASIX TAKE 1 TABLET BY MOUTH IN MORNING AND 1/2 TABLET IN THE EVENING   insulin degludec 100 UNIT/ML Sopn FlexTouch Pen Commonly known as:  TRESIBA Inject 0.26 mLs (26 Units total) into the skin daily.   losartan 50 MG tablet Commonly known as:  COZAAR Take 1 tablet (50 mg total) by mouth daily.   metFORMIN 1000 MG tablet Commonly known as:  GLUCOPHAGE Take 0.5 tablets (500 mg total) by mouth 2 (two) times daily with a meal.   metoprolol tartrate 50 MG tablet Commonly known as:  LOPRESSOR TAKE 1 TABLET (50 MG TOTAL) BY MOUTH 2 (TWO) TIMES DAILY.   MICROLET LANCETS Misc USE TO CHECK BLOOD GLUCOSE ONCE DAILY   omeprazole 20 MG capsule Commonly known as:  PRILOSEC TAKE 1 CAPSULE BY MOUTH EVERY DAY   polyvinyl alcohol 1.4 % ophthalmic solution Commonly known as:  LIQUIFILM TEARS Place 1 drop into both eyes as needed for dry eyes.   potassium chloride SA 20 MEQ tablet Commonly known as:  K-DUR,KLOR-CON TAKE 1/2 TABLET (10 MEQ TOTAL) BY MOUTH DAILY. NEED OV. *INS WILL COVER 4/19 OR LATER*   warfarin 5 MG tablet Commonly known as:  COUMADIN Take as directed by the anticoagulation clinic. If you are unsure how to take this medication, talk to your nurse or doctor. Original instructions:  AS DIRECTED          Objective:    BP (!) 170/90   Temp (!) 97 F (36.1 C)   Ht 5' 4"  (1.626 m)   Wt 285 lb (129.3 kg)   BMI 48.92 kg/m   Wt Readings from Last 3 Encounters:  12/01/17 285 lb (129.3 kg)  08/10/17 288 lb (130.6 kg)  07/31/17 288 lb (130.6 kg)    Physical Exam  Constitutional: He is oriented to person, place, and time. He appears well-developed and well-nourished. No distress.  Eyes:  Conjunctivae are normal. No scleral icterus.  Neck: Neck supple. No thyromegaly present.  Cardiovascular: Normal rate, regular rhythm, normal heart sounds and intact distal pulses.  No murmur heard. Pulmonary/Chest: Effort normal and breath sounds normal. No respiratory distress. He has no wheezes.  Musculoskeletal: Normal range of motion. He exhibits no edema.  Lymphadenopathy:    He has no cervical adenopathy.  Neurological: He is alert and oriented to person, place, and time. Coordination normal.  Skin: Skin is warm and dry. No rash noted. He is not diaphoretic.  Psychiatric: He has a normal  mood and affect. His behavior is normal.  Nursing note and vitals reviewed.   Results for orders placed or performed in visit on 07/31/17  CoaguChek XS/INR Waived  Result Value Ref Range   INR 2.2 (H) 0.9 - 1.1   Prothrombin Time 26.4 sec      Assessment & Plan:   Problem List Items Addressed This Visit      Cardiovascular and Mediastinum   Atrial fibrillation with slow ventricular response ([place])   Relevant Orders   CBC with Differential/Platelet   CoaguChek XS/INR Waived   Essential hypertension   Relevant Orders   CMP14+EGFR     Endocrine   DM (diabetes mellitus), type 2, uncontrolled, with renal complications (HCC) (Chronic)   Relevant Orders   CMP14+EGFR   Bayer DCA Hb A1c Waived   CKD stage 3 due to type 2 diabetes mellitus ([place])   Relevant Orders   CBC with Differential/Platelet   Hyperlipidemia associated with type 2 diabetes mellitus ([place]) - Primary   Relevant Orders   Lipid panel     Other   Anticoagulated on Coumadin (Chronic)   Relevant Orders   CoaguChek XS/INR Waived       Follow up plan: Return if symptoms worsen or fail to improve, for Patient needs 4-week Coumadin check in 28-monthhypertension and diabetes check.  Counseling provided for all of the vaccine components Orders Placed This Encounter  Procedures  . CBC with Differential/Platelet  .  CMP14+EGFR  . Lipid panel  . CoaguChek XS/INR Waived  . [facility]    JCaryl Pina MD [facility] 12/01/2017, 9:39 AM

## 2017-12-02 LAB — CBC WITH DIFFERENTIAL/PLATELET
BASOS ABS: 0.1 10*3/uL (ref 0.0–0.2)
Basos: 1 %
EOS (ABSOLUTE): 0.3 10*3/uL (ref 0.0–0.4)
EOS: 5 %
HEMATOCRIT: 46.3 % (ref 37.5–51.0)
HEMOGLOBIN: 14.8 g/dL (ref 13.0–17.7)
IMMATURE GRANS (ABS): 0 10*3/uL (ref 0.0–0.1)
Immature Granulocytes: 0 %
LYMPHS ABS: 1.1 10*3/uL (ref 0.7–3.1)
LYMPHS: 21 %
MCH: 28.9 pg (ref 26.6–33.0)
MCHC: 32 g/dL (ref 31.5–35.7)
MCV: 90 fL (ref 79–97)
MONOCYTES: 9 %
Monocytes Absolute: 0.5 10*3/uL (ref 0.1–0.9)
Neutrophils Absolute: 3.3 10*3/uL (ref 1.4–7.0)
Neutrophils: 64 %
Platelets: 151 10*3/uL (ref 150–450)
RBC: 5.12 x10E6/uL (ref 4.14–5.80)
RDW: 16.1 % — ABNORMAL HIGH (ref 12.3–15.4)
WBC: 5.2 10*3/uL (ref 3.4–10.8)

## 2017-12-02 LAB — CMP14+EGFR
ALT: 30 IU/L (ref 0–44)
AST: 28 IU/L (ref 0–40)
Albumin/Globulin Ratio: 1.4 (ref 1.2–2.2)
Albumin: 4.1 g/dL (ref 3.6–4.8)
Alkaline Phosphatase: 84 IU/L (ref 39–117)
BILIRUBIN TOTAL: 0.4 mg/dL (ref 0.0–1.2)
BUN/Creatinine Ratio: 17 (ref 10–24)
BUN: 27 mg/dL (ref 8–27)
CO2: 22 mmol/L (ref 20–29)
Calcium: 9.6 mg/dL (ref 8.6–10.2)
Chloride: 104 mmol/L (ref 96–106)
Creatinine, Ser: 1.56 mg/dL — ABNORMAL HIGH (ref 0.76–1.27)
GFR calc Af Amer: 53 mL/min/{1.73_m2} — ABNORMAL LOW (ref >59)
GFR calc non Af Amer: 46 mL/min/{1.73_m2} — ABNORMAL LOW (ref >59)
GLUCOSE: 156 mg/dL — AB (ref 65–99)
Globulin, Total: 2.9 g/dL (ref 1.5–4.5)
Potassium: 4.4 mmol/L (ref 3.5–5.2)
Sodium: 143 mmol/L (ref 134–144)
Total Protein: 7 g/dL (ref 6.0–8.5)

## 2017-12-02 LAB — LIPID PANEL
Chol/HDL Ratio: 4.8 ratio (ref 0.0–5.0)
Cholesterol, Total: 92 mg/dL — ABNORMAL LOW (ref 100–199)
HDL: 19 mg/dL — ABNORMAL LOW (ref >39)
LDL Calculated: 30 mg/dL (ref 0–99)
TRIGLYCERIDES: 216 mg/dL — AB (ref 0–149)
VLDL Cholesterol Cal: 43 mg/dL — ABNORMAL HIGH (ref 5–40)

## 2017-12-10 ENCOUNTER — Other Ambulatory Visit: Payer: Self-pay | Admitting: Family

## 2017-12-14 ENCOUNTER — Encounter: Payer: Self-pay | Admitting: *Deleted

## 2017-12-19 ENCOUNTER — Other Ambulatory Visit: Payer: Self-pay | Admitting: *Deleted

## 2017-12-19 MED ORDER — AZELASTINE HCL 0.05 % OP SOLN: 2.0000 [drp] | Freq: Two times a day (BID) | OPHTHALMIC | 5 refills | Status: DC

## 2017-12-22 ENCOUNTER — Other Ambulatory Visit: Payer: Self-pay

## 2017-12-22 MED ORDER — INSULIN DEGLUDEC 100 UNIT/ML ~~LOC~~ SOPN: PEN_INJECTOR | SUBCUTANEOUS | 1 refills | Status: DC

## 2017-12-22 NOTE — Telephone Encounter (Signed)
Requesting refill

## 2017-12-23 ENCOUNTER — Other Ambulatory Visit: Payer: Self-pay | Admitting: Family Medicine

## 2017-12-25 ENCOUNTER — Ambulatory Visit (INDEPENDENT_AMBULATORY_CARE_PROVIDER_SITE_OTHER): Payer: Medicare Other | Admitting: Pharmacist Clinician (PhC)/ Clinical Pharmacy Specialist

## 2017-12-25 ENCOUNTER — Other Ambulatory Visit: Payer: Self-pay | Admitting: Family

## 2017-12-25 DIAGNOSIS — Z7901 Long term (current) use of anticoagulants: Secondary | ICD-10-CM

## 2017-12-25 DIAGNOSIS — I4891 Unspecified atrial fibrillation: Secondary | ICD-10-CM | POA: Diagnosis not present

## 2017-12-25 LAB — COAGUCHEK XS/INR WAIVED
INR: 3.1 — ABNORMAL HIGH (ref 0.9–1.1)
Prothrombin Time: 36.7 s

## 2017-12-25 NOTE — Patient Instructions (Signed)
Description   No warfarin today, 1 tablet tomorrow, and then resume regular schedule  INR was 3.1 today  Eat some high vitamin K foods

## 2018-01-01 ENCOUNTER — Other Ambulatory Visit: Payer: Self-pay

## 2018-01-01 MED ORDER — DAPAGLIFLOZIN PROPANEDIOL 5 MG PO TABS: 5.0000 mg | ORAL_TABLET | Freq: Every day | ORAL | 0 refills | Status: DC

## 2018-01-08 ENCOUNTER — Encounter: Payer: Self-pay | Admitting: Family Medicine

## 2018-01-08 ENCOUNTER — Ambulatory Visit (INDEPENDENT_AMBULATORY_CARE_PROVIDER_SITE_OTHER): Payer: Medicare Other | Admitting: Family Medicine

## 2018-01-08 VITALS — BP 179/85 | HR 78 | Temp 97.1°F | Ht 64.0 in | Wt 286.0 lb

## 2018-01-08 DIAGNOSIS — I480 Paroxysmal atrial fibrillation: Secondary | ICD-10-CM

## 2018-01-08 DIAGNOSIS — Z7901 Long term (current) use of anticoagulants: Secondary | ICD-10-CM | POA: Diagnosis not present

## 2018-01-08 LAB — COAGUCHEK XS/INR WAIVED
INR: 2.1 — ABNORMAL HIGH (ref 0.9–1.1)
Prothrombin Time: 25.1 s

## 2018-01-08 NOTE — Progress Notes (Signed)
BP (!) 179/85   Pulse 78   Temp (!) 97.1 F (36.2 C) (Oral)   Ht 5' 4"  (1.626 m)   Wt 286 lb (129.7 kg)   BMI 49.09 kg/m    Subjective:    Patient ID: William Flores, male    DOB: 05-Dec-1950, 68 y.o.   MRN: [id number]  HPI: William Flores is a 68 y.o. male presenting on 01/08/2018 for Coagulation Disorder   HPI Follow-up INR and dental bleeding Patient was seen 2 weeks ago for dental bleeding and a checkup on his INR and it was slightly elevated at 3.1 and he does have poor dentition and was recommended to go see a dentist.  He has not been able to do that yet but he did do salt water gargles and the bleeding has stopped.  They had him hold the Coumadin for couple days and then start up on his normal dose which he has done and his INR is 2.1 today and he denies having any further bleeding or issues.  He does know that he has teeth that need to be pulled because they were cracked or split.  Relevant past medical, surgical, family and social history reviewed and updated as indicated. Interim medical history since our last visit reviewed. Allergies and medications reviewed and updated.  Review of Systems  Constitutional: Negative for chills and fever.  HENT: Positive for dental problem.   Respiratory: Negative for shortness of breath and wheezing.   Cardiovascular: Negative for chest pain and leg swelling.  Skin: Negative for rash.  All other systems reviewed and are negative.   Per HPI unless specifically indicated above   Allergies as of 01/08/2018      Reactions   Codeine    Headache      Medication List       Accurate as of January 08, 2018  9:39 AM. Always use your most recent med list.        allopurinol 300 MG tablet Commonly known as:  ZYLOPRIM TAKE 1 TABLET BY MOUTH EVERY DAY   atorvastatin 20 MG tablet Commonly known as:  LIPITOR TAKE 1 TABLET (20 MG TOTAL) BY MOUTH DAILY AT 6 PM.   azelastine 0.05 % ophthalmic solution Commonly known as:  OPTIVAR Place  2 drops into both eyes 2 (two) times daily. Reported on 01/06/2015   blood glucose meter kit and supplies Kit Dispense based on patient and insurance preference. Use up to four times daily as directed. (FOR ICD-9 250.00, 250.01).   CARTIA XT 180 MG 24 hr capsule Generic drug:  diltiazem TAKE 1 CAPSULE BY MOUTH ONCE DAILY   diltiazem 180 MG 24 hr capsule Commonly known as:  CARDIZEM CD Take 1 capsule (180 mg total) by mouth daily.   colchicine 0.6 MG tablet Take 2 tablets st first sign of gout flare. May take an additional tablet in 1 hour if needed.  Max of 3 tablets in 24 hours.   CONTOUR NEXT TEST test strip Generic drug:  glucose blood USE TO CHECK BG DAILY   dapagliflozin propanediol 5 MG Tabs tablet Commonly known as:  FARXIGA Take 5 mg by mouth daily.   Dulaglutide 1.5 MG/0.5ML Sopn Commonly known as:  TRULICITY Inject 1.5 mg into the skin once a week.   ferrous sulfate 325 (65 FE) MG EC tablet Take 325 mg by mouth 2 (two) times daily.   furosemide 80 MG tablet Commonly known as:  LASIX TAKE 1 TABLET BY MOUTH IN  MORNING AND 1/2 TABLET IN THE EVENING   insulin degludec 100 UNIT/ML Sopn FlexTouch Pen Commonly known as:  TRESIBA INJECT 0.26 MLS (26 UNITS TOTAL) INTO THE SKIN DAILY.   losartan 50 MG tablet Commonly known as:  COZAAR Take 1 tablet (50 mg total) by mouth daily.   metFORMIN 1000 MG tablet Commonly known as:  GLUCOPHAGE TAKE 0.5 TABLETS (500 MG TOTAL) BY MOUTH 2 (TWO) TIMES DAILY WITH A MEAL.   metoprolol tartrate 50 MG tablet Commonly known as:  LOPRESSOR TAKE 1 TABLET (50 MG TOTAL) BY MOUTH 2 (TWO) TIMES DAILY.   MICROLET LANCETS Misc USE TO CHECK BLOOD GLUCOSE ONCE DAILY   omeprazole 20 MG capsule Commonly known as:  PRILOSEC TAKE 1 CAPSULE BY MOUTH EVERY DAY   polyvinyl alcohol 1.4 % ophthalmic solution Commonly known as:  LIQUIFILM TEARS Place 1 drop into both eyes as needed for dry eyes.   potassium chloride SA 20 MEQ tablet Commonly  known as:  KLOR-CON M20 TAKE 1/2 TABLET (10 MEQ TOTAL) BY MOUTH DAILY. NEED OV. *INS WILL COVER 4/19 OR LATER*   warfarin 5 MG tablet Commonly known as:  COUMADIN Take as directed by the anticoagulation clinic. If you are unsure how to take this medication, talk to your nurse or doctor. Original instructions:  AS DIRECTED          Objective:    BP (!) 179/85   Pulse 78   Temp (!) 97.1 F (36.2 C) (Oral)   Ht 5' 4"  (1.626 m)   Wt 286 lb (129.7 kg)   BMI 49.09 kg/m   Wt Readings from Last 3 Encounters:  01/08/18 286 lb (129.7 kg)  12/01/17 285 lb (129.3 kg)  08/10/17 288 lb (130.6 kg)    Physical Exam Vitals signs and nursing note reviewed.  Constitutional:      General: He is not in acute distress.    Appearance: He is well-developed. He is not diaphoretic.  Eyes:     General: No scleral icterus.    Conjunctiva/sclera: Conjunctivae normal.  Neck:     Musculoskeletal: Neck supple.     Thyroid: No thyromegaly.  Cardiovascular:     Rate and Rhythm: Normal rate and regular rhythm.     Heart sounds: Normal heart sounds. No murmur.  Pulmonary:     Effort: Pulmonary effort is normal. No respiratory distress.     Breath sounds: Normal breath sounds. No wheezing.  Musculoskeletal: Normal range of motion.  Lymphadenopathy:     Cervical: No cervical adenopathy.  Skin:    General: Skin is warm and dry.     Findings: No rash.  Neurological:     Mental Status: He is alert and oriented to person, place, and time.     Coordination: Coordination normal.  Psychiatric:        Behavior: Behavior normal.     Description   continue previous dose  INR was 2.1 today         Assessment & Plan:   Problem List Items Addressed This Visit      Other   Anticoagulated on Coumadin - Primary (Chronic)   Relevant Orders   CoaguChek XS/INR Waived    Other Visit Diagnoses    Paroxysmal atrial fibrillation ([place])           Follow up plan: Return in about 4 weeks (around  02/05/2018), or if symptoms worsen or fail to improve, for inr recheck.  Counseling provided for all of the vaccine components  Orders Placed This Encounter  Procedures  . CoaguChek XS/INR [place], MD [place] 01/08/2018, 9:39 AM

## 2018-01-16 ENCOUNTER — Other Ambulatory Visit: Payer: Self-pay | Admitting: *Deleted

## 2018-01-17 MED ORDER — OMEPRAZOLE 20 MG PO CPDR: 20.0000 mg | DELAYED_RELEASE_CAPSULE | Freq: Every day | ORAL | 3 refills | Status: DC

## 2018-01-19 ENCOUNTER — Other Ambulatory Visit: Payer: Self-pay | Admitting: Family Medicine

## 2018-01-30 ENCOUNTER — Other Ambulatory Visit: Payer: Self-pay | Admitting: *Deleted

## 2018-01-30 MED ORDER — DILTIAZEM HCL ER COATED BEADS 180 MG PO CP24: 180.0000 mg | ORAL_CAPSULE | Freq: Every day | ORAL | 0 refills | Status: DC

## 2018-01-31 ENCOUNTER — Other Ambulatory Visit: Payer: Self-pay | Admitting: *Deleted

## 2018-01-31 MED ORDER — METOPROLOL TARTRATE 50 MG PO TABS: ORAL_TABLET | ORAL | 0 refills | Status: DC

## 2018-01-31 MED ORDER — ATORVASTATIN CALCIUM 20 MG PO TABS: ORAL_TABLET | ORAL | 0 refills | Status: DC

## 2018-02-07 ENCOUNTER — Ambulatory Visit: Payer: Medicare Other | Admitting: Pharmacist Clinician (PhC)/ Clinical Pharmacy Specialist

## 2018-02-07 ENCOUNTER — Encounter: Payer: Self-pay | Admitting: Pharmacist Clinician (PhC)/ Clinical Pharmacy Specialist

## 2018-02-07 DIAGNOSIS — Z7901 Long term (current) use of anticoagulants: Secondary | ICD-10-CM

## 2018-02-07 DIAGNOSIS — I4891 Unspecified atrial fibrillation: Secondary | ICD-10-CM

## 2018-02-07 LAB — COAGUCHEK XS/INR WAIVED
INR: 1.8 — ABNORMAL HIGH (ref 0.9–1.1)
Prothrombin Time: 21.4 s

## 2018-02-07 NOTE — Patient Instructions (Signed)
Description   2 tablet today and then resume regular schedule tomorrow.  INR was 1.8 today (goal is 2-3)  A little thick today

## 2018-02-20 ENCOUNTER — Encounter: Payer: Self-pay | Admitting: Nurse Practitioner

## 2018-02-20 ENCOUNTER — Other Ambulatory Visit: Payer: Self-pay | Admitting: Family Medicine

## 2018-02-20 ENCOUNTER — Ambulatory Visit (INDEPENDENT_AMBULATORY_CARE_PROVIDER_SITE_OTHER): Payer: Medicare Other | Admitting: Nurse Practitioner

## 2018-02-20 VITALS — BP 171/93 | HR 91 | Temp 98.0°F | Ht 64.0 in | Wt 299.0 lb

## 2018-02-20 DIAGNOSIS — J069 Acute upper respiratory infection, unspecified: Secondary | ICD-10-CM

## 2018-02-20 MED ORDER — AZITHROMYCIN 250 MG PO TABS: ORAL_TABLET | ORAL | 0 refills | Status: DC

## 2018-02-20 MED ORDER — BENZONATATE 100 MG PO CAPS: 100.0000 mg | ORAL_CAPSULE | Freq: Three times a day (TID) | ORAL | 0 refills | Status: DC | PRN

## 2018-02-20 MED ORDER — PREDNISONE 20 MG PO TABS: ORAL_TABLET | ORAL | 0 refills | Status: DC

## 2018-02-20 NOTE — Progress Notes (Signed)
Subjective:    Patient ID: William Flores, male    DOB: Oct 07, 1950, 68 y.o.   MRN: [id number]   Chief Complaint: Cough; Nasal Congestion; and Shortness of Breath   HPI Patient comes in today c/o cough, nasal congestion , runny nose and sob when coughing. This started 2 days ago. Denies fever.  * has not taken blood pressure meds this morning  Review of Systems  Constitutional: Negative for chills and fever.  HENT: Positive for congestion, postnasal drip, rhinorrhea, sinus pressure and sinus pain. Negative for sore throat and trouble swallowing.   Respiratory: Positive for cough (productive- yellowish) and shortness of breath (only when coughing).   Cardiovascular: Negative.   Gastrointestinal: Negative.   Genitourinary: Negative.   Neurological: Negative.   Psychiatric/Behavioral: Negative.   All other systems reviewed and are negative.      Objective:   Physical Exam Constitutional:      General: He is in acute distress (mild).     Appearance: He is obese.  HENT:     Right Ear: Hearing, tympanic membrane, ear canal and external ear normal.     Left Ear: Hearing, tympanic membrane, ear canal and external ear normal.     Nose: Mucosal edema, congestion and rhinorrhea present.     Right Sinus: No maxillary sinus tenderness or frontal sinus tenderness.     Left Sinus: No maxillary sinus tenderness or frontal sinus tenderness.     Mouth/Throat:     Lips: Pink.     Mouth: Mucous membranes are moist.     Pharynx: Oropharynx is clear. Uvula midline.  Neck:     Musculoskeletal: Normal range of motion.  Cardiovascular:     Rate and Rhythm: Normal rate and regular rhythm.  Pulmonary:     Effort: Pulmonary effort is normal.     Comments: Slightly diminished breath sounds right lower lobe Lymphadenopathy:     Cervical: No cervical adenopathy.  Skin:    General: Skin is warm and dry.  Neurological:     General: No focal deficit present.     Mental Status: He is alert and  oriented to person, place, and time.  Psychiatric:        Mood and Affect: Mood normal.        Behavior: Behavior normal.    BP (!) 171/93   Pulse 91   Temp 98 F (36.7 C) (Oral)   Ht 5' 4"  (1.626 m)   Wt 299 lb (135.6 kg)   SpO2 93%   BMI 51.32 kg/m       Assessment & Plan:  [name] in today with chief complaint of Cough; Nasal Congestion; and Shortness of Breath   1. Upper respiratory infection with cough and congestion 1. Take meds as prescribed 2. Use a cool mist humidifier especially during the winter months and when heat has been humid. 3. Use saline nose sprays frequently 4. Saline irrigations of the nose can be very helpful if done frequently.  * 4X daily for 1 week*  * Use of a nettie pot can be helpful with this. Follow directions with this* 5. Drink plenty of fluids 6. Keep thermostat turn down low 7.For any cough or congestion  Use plain Mucinex- regular strength or max strength is fine   * Children- consult with Pharmacist for dosing 8. For fever or aces or pains- take tylenol or ibuprofen appropriate for age and weight.  * for fevers greater than 101 orally you may alternate ibuprofen  and tylenol every  3 hours.   NeedsINR checked Friday feb 21 or monday feb 24 - azithromycin (ZITHROMAX Z-PAK) 250 MG tablet; As directed  Dispense: 6 tablet; Refill: 0 - predniSONE (DELTASONE) 20 MG tablet; 2 po at sametime daily for 5 days  Dispense: 10 tablet; Refill: 0 - benzonatate (TESSALON PERLES) 100 MG capsule; Take 1 capsule (100 mg total) by mouth 3 (three) times daily as needed for cough.  Dispense: 20 capsule; Refill: 0   Mary-Margaret Hassell Done, FNP

## 2018-02-20 NOTE — Patient Instructions (Signed)
1. Take meds as prescribed 2. Use a cool mist humidifier especially during the winter months and when heat has been humid. 3. Use saline nose sprays frequently 4. Saline irrigations of the nose can be very helpful if done frequently.  * 4X daily for 1 week*  * Use of a nettie pot can be helpful with this. Follow directions with this* 5. Drink plenty of fluids 6. Keep thermostat turn down low 7.For any cough or congestion  Use plain Mucinex- regular strength or max strength is fine   * Children- consult with Pharmacist for dosing 8. For fever or aces or pains- take tylenol or ibuprofen appropriate for age and weight.  * for fevers greater than 101 orally you may alternate ibuprofen and tylenol every  3 hours.    

## 2018-02-22 ENCOUNTER — Telehealth: Payer: Self-pay | Admitting: Family Medicine

## 2018-02-22 NOTE — Telephone Encounter (Signed)
Apt made- patient aware

## 2018-02-22 NOTE — Telephone Encounter (Signed)
Talk to just can have just to go ahead and put him in my 1125 on Monday for my acute care slot just for the pro time.

## 2018-02-23 ENCOUNTER — Ambulatory Visit: Payer: Medicare Other | Admitting: Family Medicine

## 2018-02-26 ENCOUNTER — Ambulatory Visit (INDEPENDENT_AMBULATORY_CARE_PROVIDER_SITE_OTHER): Payer: Medicare Other | Admitting: Family Medicine

## 2018-02-26 ENCOUNTER — Encounter: Payer: Self-pay | Admitting: Family Medicine

## 2018-02-26 ENCOUNTER — Other Ambulatory Visit: Payer: Self-pay | Admitting: Family Medicine

## 2018-02-26 VITALS — BP 143/83 | HR 64 | Temp 97.8°F | Ht 64.0 in | Wt 285.6 lb

## 2018-02-26 DIAGNOSIS — Z7901 Long term (current) use of anticoagulants: Secondary | ICD-10-CM

## 2018-02-26 DIAGNOSIS — J988 Other specified respiratory disorders: Secondary | ICD-10-CM

## 2018-02-26 DIAGNOSIS — J398 Other specified diseases of upper respiratory tract: Secondary | ICD-10-CM

## 2018-02-26 DIAGNOSIS — J069 Acute upper respiratory infection, unspecified: Secondary | ICD-10-CM | POA: Diagnosis not present

## 2018-02-26 DIAGNOSIS — I4891 Unspecified atrial fibrillation: Secondary | ICD-10-CM

## 2018-02-26 LAB — COAGUCHEK XS/INR WAIVED
INR: 2.3 — ABNORMAL HIGH (ref 0.9–1.1)
PROTHROMBIN TIME: 27.8 s

## 2018-02-26 MED ORDER — OMEPRAZOLE 20 MG PO CPDR: 20.0000 mg | DELAYED_RELEASE_CAPSULE | Freq: Every day | ORAL | 3 refills | Status: DC

## 2018-02-26 MED ORDER — BENZONATATE 100 MG PO CAPS: 100.0000 mg | ORAL_CAPSULE | Freq: Three times a day (TID) | ORAL | 0 refills | Status: DC | PRN

## 2018-02-26 NOTE — Telephone Encounter (Signed)
Next OV 05/02/18

## 2018-02-26 NOTE — Progress Notes (Signed)
BP (!) 143/83   Pulse 64   Temp 97.8 F (36.6 C) (Oral)   Ht _0  (1.626 m)   Wt 285 lb 9.6 oz (129.5 kg)   BMI 49.02 kg/m    Subjective:    Patient ID: William Flores, male    DOB: [birth year], 68 y.o.   MRN: [id number]  HPI: William Flores is a 68 y.o. male presenting on 02/26/2018 for Coagulation Disorder   HPI Coumadin and INR recheck today Patient denies any signs of bleeding or bruising or palpitations.  Patient denies any abnormal bruising or bleeding and says that he feels that he is doing well, he just finished an antibiotic couple days ago and azithromycin.  His INR today is 2.3  Cough and congestion residual Patient was recently treated for an upper respiratory infection with azithromycin and prednisone and Tessalon Perles.  He has been doing a lot better but still has some residual cough and he does have a nose spray which he is using some of the time but not all the time.  He denies any fevers or chills or shortness of breath or wheezing.  He says the cough is sometimes worse at night and then he dries out at night with his CPAP machine he is wondering today if he can get more of the cough medicine to help him get over the last little bit.  He says it feels a lot better and the cough is improved but still just there.  Relevant past medical, surgical, family and social history reviewed and updated as indicated. Interim medical history since our last visit reviewed. Allergies and medications reviewed and updated.  Review of Systems  Constitutional: Negative for chills and fever.  HENT: Positive for congestion, rhinorrhea and sore throat. Negative for ear discharge, ear pain, postnasal drip, sinus pressure, sneezing and voice change.   Eyes: Negative for pain, discharge, redness and visual disturbance.  Respiratory: Positive for cough. Negative for shortness of breath and wheezing.   Cardiovascular: Negative for chest pain and leg swelling.  Gastrointestinal: Negative for  blood in stool.  Genitourinary: Negative for hematuria.  Musculoskeletal: Negative for back pain and gait problem.  Skin: Negative for rash.  All other systems reviewed and are negative.   Per HPI unless specifically indicated above   Allergies as of 02/26/2018      Reactions   Codeine    Headache      Medication List       Accurate as of February 26, 2018 11:46 AM. Always use your most recent med list.        allopurinol 300 MG tablet Commonly known as:  ZYLOPRIM TAKE 1 TABLET BY MOUTH EVERY DAY   atorvastatin 20 MG tablet Commonly known as:  LIPITOR TAKE 1 TABLET (20 MG TOTAL) BY MOUTH DAILY AT 6 PM.   azelastine 0.05 % ophthalmic solution Commonly known as:  OPTIVAR Place 2 drops into both eyes 2 (two) times daily. Reported on 01/06/2015   benzonatate 100 MG capsule Commonly known as:  TESSALON PERLES Take 1 capsule (100 mg total) by mouth 3 (three) times daily as needed for cough.   blood glucose meter kit and supplies Kit Dispense based on patient and insurance preference. Use up to four times daily as directed. (FOR ICD-9 250.00, 250.01).   colchicine 0.6 MG tablet Take 2 tablets st first sign of gout flare. May take an additional tablet in 1 hour if needed.  Max of 3 tablets in  24 hours.   CONTOUR NEXT TEST test strip Generic drug:  glucose blood USE TO CHECK BG DAILY   dapagliflozin propanediol 5 MG Tabs tablet Commonly known as:  FARXIGA Take 5 mg by mouth daily.   diltiazem 180 MG 24 hr capsule Commonly known as:  CARDIZEM CD Take 1 capsule (180 mg total) by mouth daily.   Dulaglutide 1.5 MG/0.5ML Sopn Commonly known as:  TRULICITY Inject 1.5 mg into the skin once a week.   ferrous sulfate 325 (65 FE) MG EC tablet Take 325 mg by mouth 2 (two) times daily.   furosemide 80 MG tablet Commonly known as:  LASIX TAKE 1 TABLET BY MOUTH IN MORNING AND 1/2 TABLET IN THE EVENING   insulin degludec 100 UNIT/ML Sopn FlexTouch Pen Commonly known as:   TRESIBA INJECT 0.26 MLS (26 UNITS TOTAL) INTO THE SKIN DAILY.   losartan 50 MG tablet Commonly known as:  COZAAR Take 1 tablet (50 mg total) by mouth daily.   metFORMIN 1000 MG tablet Commonly known as:  GLUCOPHAGE TAKE 0.5 TABLETS (500 MG TOTAL) BY MOUTH 2 (TWO) TIMES DAILY WITH A MEAL.   metoprolol tartrate 50 MG tablet Commonly known as:  LOPRESSOR TAKE 1 TABLET (50 MG TOTAL) BY MOUTH 2 (TWO) TIMES DAILY.   MICROLET LANCETS Misc USE TO CHECK BLOOD GLUCOSE ONCE DAILY   omeprazole 20 MG capsule Commonly known as:  PRILOSEC Take 1 capsule (20 mg total) by mouth daily.   polyvinyl alcohol 1.4 % ophthalmic solution Commonly known as:  LIQUIFILM TEARS Place 1 drop into both eyes as needed for dry eyes.   potassium chloride SA 20 MEQ tablet Commonly known as:  KLOR-CON M20 TAKE 1/2 TABLET (10 MEQ TOTAL) BY MOUTH DAILY. NEED OV. *INS WILL COVER 4/19 OR LATER*   warfarin 5 MG tablet Commonly known as:  COUMADIN Take as directed by the anticoagulation clinic. If you are unsure how to take this medication, talk to your nurse or doctor. Original instructions:  AS DIRECTED          Objective:    BP (!) 143/83   Pulse 64   Temp 97.8 F (36.6 C) (Oral)   Ht _0  (1.626 m)   Wt 285 lb 9.6 oz (129.5 kg)   BMI 49.02 kg/m   Wt Readings from Last 3 Encounters:  02/26/18 285 lb 9.6 oz (129.5 kg)  02/20/18 299 lb (135.6 kg)  01/08/18 286 lb (129.7 kg)    Physical Exam Vitals signs and nursing note reviewed.  Constitutional:      General: He is not in acute distress.    Appearance: He is well-developed. He is not diaphoretic.  Eyes:     General: No scleral icterus.    Conjunctiva/sclera: Conjunctivae normal.  Neck:     Musculoskeletal: Neck supple.     Thyroid: No thyromegaly.  Cardiovascular:     Rate and Rhythm: Normal rate and regular rhythm.     Heart sounds: Normal heart sounds. No murmur.  Pulmonary:     Effort: Pulmonary effort is normal. No respiratory  distress.     Breath sounds: Normal breath sounds. No wheezing.  Musculoskeletal: Normal range of motion.  Lymphadenopathy:     Cervical: No cervical adenopathy.  Skin:    General: Skin is warm and dry.     Findings: No rash.  Neurological:     Mental Status: He is alert and oriented to person, place, and time.     Coordination: Coordination normal.  Psychiatric:        Behavior: Behavior normal.     Description   Continue 5 mg on Tuesdays and Thursdays and 7.5 mg the rest of the days  INR was 2.3 today (goal is 2-3)          Assessment & Plan:   Problem List Items Addressed This Visit      Cardiovascular and Mediastinum   Atrial fibrillation with slow ventricular response ([facility])     Other   Anticoagulated on Coumadin - Primary (Chronic)   Relevant Orders   CoaguChek XS/INR Waived    Other Visit Diagnoses    Congestion of upper respiratory tract       Relevant Medications   benzonatate (TESSALON PERLES) 100 MG capsule   Upper respiratory infection with cough and congestion       Relevant Medications   benzonatate (TESSALON PERLES) 100 MG capsule      Patient has no signs of A. fib or bleeding today, continue current medication for Coumadin.  We will give more Tessalon Perles recommended Flonase for the residual congestion that he has. Follow up plan: Return in about 4 weeks (around 03/26/2018), or if symptoms worsen or fail to improve, for INR recheck.  Counseling provided for all of the vaccine components Orders Placed This Encounter  Procedures  . CoaguChek XS/INR [place], MD [facility] 02/26/2018, 11:46 AM

## 2018-02-26 NOTE — Patient Instructions (Signed)
Use Flonase or Nasonex or Nasacort or Rhinocort before bedtime along with nasal saline throughout the day and before bedtime to help with the cough and congestion along with the Tessalon Perles.

## 2018-03-19 ENCOUNTER — Other Ambulatory Visit: Payer: Self-pay | Admitting: Family Medicine

## 2018-03-20 ENCOUNTER — Other Ambulatory Visit: Payer: Self-pay | Admitting: *Deleted

## 2018-03-20 MED ORDER — DULAGLUTIDE 1.5 MG/0.5ML ~~LOC~~ SOAJ: 1.5000 mg | SUBCUTANEOUS | 0 refills | Status: DC

## 2018-03-21 ENCOUNTER — Other Ambulatory Visit: Payer: Self-pay

## 2018-03-21 ENCOUNTER — Ambulatory Visit (INDEPENDENT_AMBULATORY_CARE_PROVIDER_SITE_OTHER): Payer: Medicare Other | Admitting: Pharmacist Clinician (PhC)/ Clinical Pharmacy Specialist

## 2018-03-21 ENCOUNTER — Encounter: Payer: Self-pay | Admitting: Pharmacist Clinician (PhC)/ Clinical Pharmacy Specialist

## 2018-03-21 DIAGNOSIS — J988 Other specified respiratory disorders: Secondary | ICD-10-CM | POA: Diagnosis not present

## 2018-03-21 DIAGNOSIS — J069 Acute upper respiratory infection, unspecified: Secondary | ICD-10-CM

## 2018-03-21 DIAGNOSIS — I4891 Unspecified atrial fibrillation: Secondary | ICD-10-CM | POA: Diagnosis not present

## 2018-03-21 DIAGNOSIS — Z7901 Long term (current) use of anticoagulants: Secondary | ICD-10-CM

## 2018-03-21 DIAGNOSIS — J398 Other specified diseases of upper respiratory tract: Secondary | ICD-10-CM

## 2018-03-21 LAB — COAGUCHEK XS/INR WAIVED
INR: 2.2 — ABNORMAL HIGH (ref 0.9–1.1)
Prothrombin Time: 26.6 s

## 2018-03-21 MED ORDER — BENZONATATE 100 MG PO CAPS: 100.0000 mg | ORAL_CAPSULE | Freq: Three times a day (TID) | ORAL | 0 refills | Status: DC | PRN

## 2018-03-21 NOTE — Patient Instructions (Signed)
Description   Continue 5 mg on Tuesdays and Thursdays and 7.5 mg the rest of the days  INR was 2.2 today (goal is 2-3)

## 2018-03-28 ENCOUNTER — Ambulatory Visit: Payer: Medicare Other | Admitting: Family Medicine

## 2018-03-30 ENCOUNTER — Other Ambulatory Visit: Payer: Self-pay | Admitting: Family Medicine

## 2018-04-03 ENCOUNTER — Other Ambulatory Visit: Payer: Self-pay | Admitting: *Deleted

## 2018-04-03 MED ORDER — POTASSIUM CHLORIDE CRYS ER 20 MEQ PO TBCR: EXTENDED_RELEASE_TABLET | ORAL | 2 refills | Status: DC

## 2018-04-10 ENCOUNTER — Other Ambulatory Visit: Payer: Self-pay | Admitting: *Deleted

## 2018-04-11 ENCOUNTER — Other Ambulatory Visit: Payer: Self-pay | Admitting: *Deleted

## 2018-04-21 ENCOUNTER — Other Ambulatory Visit: Payer: Self-pay | Admitting: Family Medicine

## 2018-04-28 ENCOUNTER — Other Ambulatory Visit: Payer: Self-pay | Admitting: Family Medicine

## 2018-04-30 ENCOUNTER — Other Ambulatory Visit: Payer: Self-pay | Admitting: Family Medicine

## 2018-04-30 DIAGNOSIS — J988 Other specified respiratory disorders: Secondary | ICD-10-CM

## 2018-04-30 DIAGNOSIS — J398 Other specified diseases of upper respiratory tract: Secondary | ICD-10-CM

## 2018-04-30 DIAGNOSIS — J069 Acute upper respiratory infection, unspecified: Secondary | ICD-10-CM

## 2018-05-02 ENCOUNTER — Other Ambulatory Visit: Payer: Self-pay

## 2018-05-02 ENCOUNTER — Ambulatory Visit (INDEPENDENT_AMBULATORY_CARE_PROVIDER_SITE_OTHER): Payer: Medicare Other | Admitting: Family Medicine

## 2018-05-02 ENCOUNTER — Encounter: Payer: Self-pay | Admitting: Family Medicine

## 2018-05-02 VITALS — BP 180/106 | HR 66 | Temp 97.2°F | Ht 64.0 in | Wt 291.8 lb

## 2018-05-02 DIAGNOSIS — I10 Essential (primary) hypertension: Secondary | ICD-10-CM

## 2018-05-02 DIAGNOSIS — I4891 Unspecified atrial fibrillation: Secondary | ICD-10-CM

## 2018-05-02 DIAGNOSIS — E1129 Type 2 diabetes mellitus with other diabetic kidney complication: Secondary | ICD-10-CM | POA: Diagnosis not present

## 2018-05-02 DIAGNOSIS — Z7901 Long term (current) use of anticoagulants: Secondary | ICD-10-CM

## 2018-05-02 DIAGNOSIS — E1165 Type 2 diabetes mellitus with hyperglycemia: Secondary | ICD-10-CM | POA: Diagnosis not present

## 2018-05-02 DIAGNOSIS — M1A0710 Idiopathic chronic gout, right ankle and foot, without tophus (tophi): Secondary | ICD-10-CM | POA: Diagnosis not present

## 2018-05-02 DIAGNOSIS — R059 Cough, unspecified: Secondary | ICD-10-CM

## 2018-05-02 DIAGNOSIS — E1122 Type 2 diabetes mellitus with diabetic chronic kidney disease: Secondary | ICD-10-CM | POA: Diagnosis not present

## 2018-05-02 DIAGNOSIS — R05 Cough: Secondary | ICD-10-CM

## 2018-05-02 DIAGNOSIS — IMO0002 Reserved for concepts with insufficient information to code with codable children: Secondary | ICD-10-CM

## 2018-05-02 DIAGNOSIS — N183 Chronic kidney disease, stage 3 (moderate): Secondary | ICD-10-CM | POA: Diagnosis not present

## 2018-05-02 DIAGNOSIS — E785 Hyperlipidemia, unspecified: Secondary | ICD-10-CM

## 2018-05-02 DIAGNOSIS — E1169 Type 2 diabetes mellitus with other specified complication: Secondary | ICD-10-CM | POA: Diagnosis not present

## 2018-05-02 LAB — COAGUCHEK XS/INR WAIVED
INR: 2.2 — ABNORMAL HIGH (ref 0.9–1.1)
Prothrombin Time: 26.9 s

## 2018-05-02 LAB — BAYER DCA HB A1C WAIVED: HB A1C (BAYER DCA - WAIVED): 7.8 % — ABNORMAL HIGH (ref <7.0)

## 2018-05-02 MED ORDER — BENZONATATE 100 MG PO CAPS: 100.0000 mg | ORAL_CAPSULE | Freq: Three times a day (TID) | ORAL | 2 refills | Status: DC | PRN

## 2018-05-02 NOTE — Progress Notes (Signed)
BP (!) 180/106   Pulse 66   Temp (!) 97.2 F (36.2 C) (Oral)   Ht _0  (1.626 m)   Wt 291 lb 12.8 oz (132.4 kg)   BMI 50.09 kg/m    Subjective:   Patient ID: William Flores, male    DOB: 08/29/1950, 68 y.o.   MRN: [id number]  HPI: William Flores is a 68 y.o. male presenting on 05/02/2018 for Coagulation Disorder and Medical Management of Chronic Issues   HPI Type 2 diabetes mellitus Patient comes in today for recheck of his diabetes. Patient has been currently taking metformin and [place] and farxIGA and Trulicity. Patient is currently on an ACE inhibitor/ARB. Patient has not seen an ophthalmologist this year. Patient denies any issues with their feet.   Hypertension Patient is currently on losartan and metoprolol and diltiazem, and their blood pressure today is 180/106 but he says he just took the medication, he will monitor over the next 2 weeks and call. Patient denies any lightheadedness or dizziness. Patient denies headaches, blurred vision, chest pains, shortness of breath, or weakness. Denies any side effects from medication and is content with current medication.  Hyperlipidemia Patient is coming in for recheck of his hyperlipidemia. The patient is currently taking Lipitor. They deny any issues with myalgias or history of liver damage from it. They deny any focal numbness or weakness or chest pain.   afib Patient is coming in for Coumadin recheck and A. fib recheck.  He denies any chest pain or palpitations or bleeding.  gout Patient says his gout is been doing pretty good, he does currently have a flare right now and has been taking the colchicine.  He needs a refill on the colchicine.  He says it works really good when he does get a flare.  He says it flares in his right lateral ankle where he has gotten it previously.  Relevant past medical, surgical, family and social history reviewed and updated as indicated. Interim medical history since our last visit reviewed.  Allergies and medications reviewed and updated.  Review of Systems  Constitutional: Negative for chills and fever.  Eyes: Negative for visual disturbance.  Respiratory: Negative for shortness of breath and wheezing.   Cardiovascular: Negative for chest pain, palpitations and leg swelling.  Gastrointestinal: Negative for anal bleeding.  Genitourinary: Negative for hematuria.  Musculoskeletal: Positive for arthralgias and joint swelling. Negative for back pain and gait problem.  Skin: Negative for rash.  Neurological: Negative for dizziness, weakness and light-headedness.  All other systems reviewed and are negative.   Per HPI unless specifically indicated above   Allergies as of 05/02/2018      Reactions   Codeine    Headache      Medication List       Accurate as of May 02, 2018  9:22 AM. Always use your most recent med list.        allopurinol 300 MG tablet Commonly known as:  ZYLOPRIM TAKE 1 TABLET BY MOUTH EVERY DAY   atorvastatin 20 MG tablet Commonly known as:  LIPITOR TAKE 1 TABLET (20 MG TOTAL) BY MOUTH DAILY AT 6 PM.   azelastine 0.05 % ophthalmic solution Commonly known as:  OPTIVAR Place 2 drops into both eyes 2 (two) times daily. Reported on 01/06/2015   benzonatate 100 MG capsule Commonly known as:  Tessalon Perles Take 1 capsule (100 mg total) by mouth 3 (three) times daily as needed for cough.   blood glucose meter kit  and supplies Kit Dispense based on patient and insurance preference. Use up to four times daily as directed. (FOR ICD-9 250.00, 250.01).   colchicine 0.6 MG tablet Take 2 tablets st first sign of gout flare. May take an additional tablet in 1 hour if needed.  Max of 3 tablets in 24 hours.   Contour Next Test test strip Generic drug:  glucose blood USE TO CHECK BG DAILY   diltiazem 180 MG 24 hr capsule Commonly known as:  CARDIZEM CD TAKE 1 CAPSULE BY MOUTH EVERY DAY   Dulaglutide 1.5 MG/0.5ML Sopn Commonly known as:  Trulicity  Inject 1.5 mg into the skin once a week.   Farxiga 5 MG Tabs tablet Generic drug:  dapagliflozin propanediol TAKE 1 TABLET BY MOUTH EVERY DAY   ferrous sulfate 325 (65 FE) MG EC tablet Take 325 mg by mouth 2 (two) times daily.   furosemide 80 MG tablet Commonly known as:  LASIX TAKE 1 TABLET BY MOUTH IN MORNING AND 1/2 TABLET IN THE EVENING   insulin degludec 100 UNIT/ML Sopn FlexTouch Pen Commonly known as:  TRESIBA INJECT 0.26 MLS (26 UNITS TOTAL) INTO THE SKIN DAILY.   losartan 50 MG tablet Commonly known as:  COZAAR Take 1 tablet (50 mg total) by mouth daily.   metFORMIN 1000 MG tablet Commonly known as:  GLUCOPHAGE TAKE 0.5 TABLETS (500 MG TOTAL) BY MOUTH 2 (TWO) TIMES DAILY WITH A MEAL.   metoprolol tartrate 50 MG tablet Commonly known as:  LOPRESSOR TAKE 1 TABLET BY MOUTH TWICE A DAY   Microlet Lancets Misc USE TO CHECK BLOOD GLUCOSE ONCE DAILY   omeprazole 20 MG capsule Commonly known as:  PRILOSEC Take 1 capsule (20 mg total) by mouth daily.   polyvinyl alcohol 1.4 % ophthalmic solution Commonly known as:  LIQUIFILM TEARS Place 1 drop into both eyes as needed for dry eyes.   potassium chloride SA 20 MEQ tablet Commonly known as:  Klor-Con M20 TAKE 1/2 TABLET (10 MEQ TOTAL) BY MOUTH DAILY. NEED OV. *INS WILL COVER 4/19 OR LATER*   warfarin 5 MG tablet Commonly known as:  COUMADIN Take as directed by the anticoagulation clinic. If you are unsure how to take this medication, talk to your nurse or doctor. Original instructions:  AS DIRECTED        Objective:   BP (!) 180/106   Pulse 66   Temp (!) 97.2 F (36.2 C) (Oral)   Ht 5' 4"  (1.626 m)   Wt 291 lb 12.8 oz (132.4 kg)   BMI 50.09 kg/m   Wt Readings from Last 3 Encounters:  05/02/18 291 lb 12.8 oz (132.4 kg)  02/26/18 285 lb 9.6 oz (129.5 kg)  02/20/18 299 lb (135.6 kg)    Physical Exam Vitals signs and nursing note reviewed.  Constitutional:      General: He is not in acute distress.     Appearance: He is well-developed. He is obese. He is not diaphoretic.  Eyes:     General: No scleral icterus.    Conjunctiva/sclera: Conjunctivae normal.  Neck:     Musculoskeletal: Neck supple.     Thyroid: No thyromegaly.  Cardiovascular:     Rate and Rhythm: Normal rate. Rhythm irregular.     Heart sounds: Normal heart sounds. No murmur.  Pulmonary:     Effort: Pulmonary effort is normal. No respiratory distress.     Breath sounds: Normal breath sounds. No wheezing.  Lymphadenopathy:     Cervical: No cervical adenopathy.  Skin:    General: Skin is warm and dry.     Findings: No rash.  Neurological:     Mental Status: He is alert and oriented to person, place, and time.     Coordination: Coordination normal.  Psychiatric:        Behavior: Behavior normal.     Description   Continue 5 mg on Tuesdays and Thursdays and 7.5 mg the rest of the days  INR was 2.2 today (goal is 2-3)        Assessment & Plan:   Problem List Items Addressed This Visit      Cardiovascular and Mediastinum   Atrial fibrillation with slow ventricular response ([place])   Essential hypertension   Relevant Orders   CMP14+EGFR   Microalbumin / creatinine urine ratio     Endocrine   DM (diabetes mellitus), type 2, uncontrolled, with renal complications (HCC) - Primary (Chronic)   Relevant Orders   CMP14+EGFR   Bayer DCA Hb A1c Waived   Microalbumin / creatinine urine ratio   CKD stage 3 due to type 2 diabetes mellitus ([place])   Relevant Orders   CBC with Differential/Platelet   CMP14+EGFR   Hyperlipidemia associated with type 2 diabetes mellitus ([place])   Relevant Orders   Lipid panel     Other   Gout (Chronic)   Relevant Orders   Uric acid   Anticoagulated on Coumadin (Chronic)   Relevant Orders   CoaguChek XS/INR Waived    Other Visit Diagnoses    Cough       Relevant Medications   benzonatate (TESSALON PERLES) 100 MG capsule      Blood pressure high, he will check at home over  the next week and if still high we will increase the losartan  Follow up plan: Return in about 3 months (around 08/01/2018), or if symptoms worsen or fail to improve, for Diabetes recheck.  Counseling provided for all of the vaccine components Orders Placed This Encounter  Procedures  . CoaguChek XS/INR Waived  . CBC with Differential/Platelet  . CMP14+EGFR  . Lipid panel  . Bayer DCA Hb A1c Waived  . Uric acid  . Microalbumin / creatinine urine ratio    Caryl Pina, MD [facility] 05/02/2018, 9:22 AM

## 2018-05-03 LAB — CMP14+EGFR
ALT: 17 IU/L (ref 0–44)
AST: 17 IU/L (ref 0–40)
Albumin/Globulin Ratio: 1.3 (ref 1.2–2.2)
Albumin: 3.9 g/dL (ref 3.8–4.8)
Alkaline Phosphatase: 66 IU/L (ref 39–117)
BUN/Creatinine Ratio: 19 (ref 10–24)
BUN: 28 mg/dL — ABNORMAL HIGH (ref 8–27)
Bilirubin Total: 0.4 mg/dL (ref 0.0–1.2)
CO2: 22 mmol/L (ref 20–29)
Calcium: 9.5 mg/dL (ref 8.6–10.2)
Chloride: 104 mmol/L (ref 96–106)
Creatinine, Ser: 1.47 mg/dL — ABNORMAL HIGH (ref 0.76–1.27)
GFR calc Af Amer: 57 mL/min/{1.73_m2} — ABNORMAL LOW (ref >59)
GFR calc non Af Amer: 49 mL/min/{1.73_m2} — ABNORMAL LOW (ref >59)
Globulin, Total: 2.9 g/dL (ref 1.5–4.5)
Glucose: 142 mg/dL — ABNORMAL HIGH (ref 65–99)
Potassium: 3.6 mmol/L (ref 3.5–5.2)
Sodium: 145 mmol/L — ABNORMAL HIGH (ref 134–144)
Total Protein: 6.8 g/dL (ref 6.0–8.5)

## 2018-05-03 LAB — CBC WITH DIFFERENTIAL/PLATELET
Basophils Absolute: 0.1 10*3/uL (ref 0.0–0.2)
Basos: 1 %
EOS (ABSOLUTE): 0.1 10*3/uL (ref 0.0–0.4)
Eos: 2 %
Hematocrit: 45.1 % (ref 37.5–51.0)
Hemoglobin: 14 g/dL (ref 13.0–17.7)
Immature Grans (Abs): 0 10*3/uL (ref 0.0–0.1)
Immature Granulocytes: 1 %
Lymphocytes Absolute: 1.1 10*3/uL (ref 0.7–3.1)
Lymphs: 20 %
MCH: 27.8 pg (ref 26.6–33.0)
MCHC: 31 g/dL — ABNORMAL LOW (ref 31.5–35.7)
MCV: 90 fL (ref 79–97)
Monocytes Absolute: 0.6 10*3/uL (ref 0.1–0.9)
Monocytes: 10 %
Neutrophils Absolute: 3.7 10*3/uL (ref 1.4–7.0)
Neutrophils: 66 %
Platelets: 149 10*3/uL — ABNORMAL LOW (ref 150–450)
RBC: 5.04 x10E6/uL (ref 4.14–5.80)
RDW: 15.7 % — ABNORMAL HIGH (ref 11.6–15.4)
WBC: 5.6 10*3/uL (ref 3.4–10.8)

## 2018-05-03 LAB — LIPID PANEL
Chol/HDL Ratio: 4.3 ratio (ref 0.0–5.0)
Cholesterol, Total: 90 mg/dL — ABNORMAL LOW (ref 100–199)
HDL: 21 mg/dL — ABNORMAL LOW (ref >39)
LDL Calculated: 39 mg/dL (ref 0–99)
Triglycerides: 149 mg/dL (ref 0–149)
VLDL Cholesterol Cal: 30 mg/dL (ref 5–40)

## 2018-05-03 LAB — MICROALBUMIN / CREATININE URINE RATIO
Creatinine, Urine: 66.4 mg/dL
Microalb/Creat Ratio: 1834 mg/g creat — ABNORMAL HIGH (ref 0–29)
Microalbumin, Urine: 1218.1 ug/mL

## 2018-05-03 LAB — URIC ACID: Uric Acid: 10.6 mg/dL — ABNORMAL HIGH (ref 3.7–8.6)

## 2018-05-04 ENCOUNTER — Other Ambulatory Visit: Payer: Self-pay | Admitting: Family Medicine

## 2018-05-18 ENCOUNTER — Other Ambulatory Visit: Payer: Self-pay | Admitting: Family Medicine

## 2018-05-25 ENCOUNTER — Other Ambulatory Visit: Payer: Self-pay | Admitting: Family Medicine

## 2018-06-16 ENCOUNTER — Other Ambulatory Visit: Payer: Self-pay | Admitting: Family Medicine

## 2018-07-04 DIAGNOSIS — I4891 Unspecified atrial fibrillation: Secondary | ICD-10-CM | POA: Diagnosis not present

## 2018-07-04 DIAGNOSIS — Z7901 Long term (current) use of anticoagulants: Secondary | ICD-10-CM | POA: Diagnosis not present

## 2018-07-04 DIAGNOSIS — Z79899 Other long term (current) drug therapy: Secondary | ICD-10-CM | POA: Diagnosis not present

## 2018-07-04 DIAGNOSIS — K056 Periodontal disease, unspecified: Secondary | ICD-10-CM | POA: Diagnosis not present

## 2018-07-04 DIAGNOSIS — Z87891 Personal history of nicotine dependence: Secondary | ICD-10-CM | POA: Diagnosis not present

## 2018-07-04 DIAGNOSIS — K0530 Chronic periodontitis, unspecified: Secondary | ICD-10-CM | POA: Diagnosis not present

## 2018-07-04 DIAGNOSIS — S025XXA Fracture of tooth (traumatic), initial encounter for closed fracture: Secondary | ICD-10-CM | POA: Diagnosis not present

## 2018-07-04 DIAGNOSIS — X58XXXA Exposure to other specified factors, initial encounter: Secondary | ICD-10-CM | POA: Diagnosis not present

## 2018-07-04 DIAGNOSIS — Z7982 Long term (current) use of aspirin: Secondary | ICD-10-CM | POA: Diagnosis not present

## 2018-07-04 DIAGNOSIS — Z85038 Personal history of other malignant neoplasm of large intestine: Secondary | ICD-10-CM | POA: Diagnosis not present

## 2018-07-08 ENCOUNTER — Other Ambulatory Visit: Payer: Self-pay | Admitting: Family Medicine

## 2018-07-14 ENCOUNTER — Other Ambulatory Visit: Payer: Self-pay | Admitting: Family Medicine

## 2018-07-28 ENCOUNTER — Other Ambulatory Visit: Payer: Self-pay | Admitting: Family Medicine

## 2018-07-29 ENCOUNTER — Other Ambulatory Visit: Payer: Self-pay | Admitting: Family Medicine

## 2018-07-30 ENCOUNTER — Other Ambulatory Visit: Payer: Self-pay | Admitting: Family Medicine

## 2018-08-14 ENCOUNTER — Other Ambulatory Visit: Payer: Self-pay | Admitting: Family Medicine

## 2018-08-22 ENCOUNTER — Other Ambulatory Visit: Payer: Self-pay | Admitting: Family Medicine

## 2018-08-30 ENCOUNTER — Other Ambulatory Visit: Payer: Self-pay

## 2018-09-03 ENCOUNTER — Ambulatory Visit (HOSPITAL_COMMUNITY)
Admission: RE | Admit: 2018-09-03 | Discharge: 2018-09-03 | Disposition: A | Discharge status: Home / Self Care | Payer: Medicare Other | Source: Ambulatory Visit | Attending: Family Medicine | Admitting: Family Medicine

## 2018-09-03 ENCOUNTER — Other Ambulatory Visit: Payer: Self-pay

## 2018-09-03 ENCOUNTER — Encounter: Payer: Self-pay | Admitting: Family Medicine

## 2018-09-03 ENCOUNTER — Ambulatory Visit (INDEPENDENT_AMBULATORY_CARE_PROVIDER_SITE_OTHER): Payer: Medicare Other | Admitting: Family Medicine

## 2018-09-03 VITALS — BP 121/72 | HR 80 | Temp 97.8°F | Ht 64.0 in | Wt 293.0 lb

## 2018-09-03 DIAGNOSIS — E1165 Type 2 diabetes mellitus with hyperglycemia: Secondary | ICD-10-CM | POA: Diagnosis not present

## 2018-09-03 DIAGNOSIS — Z7901 Long term (current) use of anticoagulants: Secondary | ICD-10-CM

## 2018-09-03 DIAGNOSIS — E1129 Type 2 diabetes mellitus with other diabetic kidney complication: Secondary | ICD-10-CM

## 2018-09-03 DIAGNOSIS — E1122 Type 2 diabetes mellitus with diabetic chronic kidney disease: Secondary | ICD-10-CM | POA: Diagnosis not present

## 2018-09-03 DIAGNOSIS — R140 Abdominal distension (gaseous): Secondary | ICD-10-CM

## 2018-09-03 DIAGNOSIS — N183 Chronic kidney disease, stage 3 unspecified: Secondary | ICD-10-CM

## 2018-09-03 DIAGNOSIS — M1A0710 Idiopathic chronic gout, right ankle and foot, without tophus (tophi): Secondary | ICD-10-CM

## 2018-09-03 DIAGNOSIS — E785 Hyperlipidemia, unspecified: Secondary | ICD-10-CM

## 2018-09-03 DIAGNOSIS — R188 Other ascites: Secondary | ICD-10-CM | POA: Diagnosis not present

## 2018-09-03 DIAGNOSIS — E1169 Type 2 diabetes mellitus with other specified complication: Secondary | ICD-10-CM

## 2018-09-03 DIAGNOSIS — IMO0002 Reserved for concepts with insufficient information to code with codable children: Secondary | ICD-10-CM

## 2018-09-03 DIAGNOSIS — R05 Cough: Secondary | ICD-10-CM | POA: Diagnosis not present

## 2018-09-03 DIAGNOSIS — I10 Essential (primary) hypertension: Secondary | ICD-10-CM | POA: Diagnosis not present

## 2018-09-03 DIAGNOSIS — R059 Cough, unspecified: Secondary | ICD-10-CM

## 2018-09-03 LAB — COAGUCHEK XS/INR WAIVED
INR: 3.5 — ABNORMAL HIGH (ref 0.9–1.1)
Prothrombin Time: 41.9 s

## 2018-09-03 LAB — BAYER DCA HB A1C WAIVED: HB A1C (BAYER DCA - WAIVED): 7.6 % — ABNORMAL HIGH (ref <7.0)

## 2018-09-03 MED ORDER — BENZONATATE 100 MG PO CAPS: 100.0000 mg | ORAL_CAPSULE | Freq: Three times a day (TID) | ORAL | 2 refills | Status: DC | PRN

## 2018-09-03 MED ORDER — ATORVASTATIN CALCIUM 20 MG PO TABS: ORAL_TABLET | ORAL | 3 refills | Status: DC

## 2018-09-03 MED ORDER — METOPROLOL TARTRATE 50 MG PO TABS: 50.0000 mg | ORAL_TABLET | Freq: Two times a day (BID) | ORAL | 3 refills | Status: DC

## 2018-09-03 MED ORDER — FARXIGA 5 MG PO TABS: 5.0000 mg | ORAL_TABLET | Freq: Every day | ORAL | 3 refills | Status: DC

## 2018-09-03 NOTE — Progress Notes (Signed)
BP 121/72   Pulse 80   Temp 97.8 F (36.6 C) (Temporal)   Ht _0  (1.626 m)   Wt 293 lb (132.9 kg)   BMI 50.29 kg/m    Subjective:   Patient ID: William Flores, male    DOB: 12-02-1950, 68 y.o.   MRN: [id number]  HPI: William HOLZHAUER is a 68 y.o. male presenting on 09/03/2018 for Coagulation Disorder, Hypertension, and Diabetes   HPI Has been having some weight gain and abdominal distention that is been worsening over the past few months.  His weight is up to 293 from 287 over the past few months.  He says he has been more sedentary at home but he does feel a lot more pressure in his abdomen and feels like he has more weight from the veins in his abdomen.  He has a history of a mass or tumor in his abdomen being removed and that is what makes him nervous about this in his abdomen.  Type 2 diabetes mellitus Patient comes in today for recheck of his diabetes. Patient has been currently taking farxIGA and Tresiba and metformin. Patient is currently on an ACE inhibitor/ARB. Patient has not seen an ophthalmologist this year. Patient denies any issues with their feet.   Hypertension Patient is currently on diltiazem and losartan, and their blood pressure today is 121/72. Patient denies any lightheadedness or dizziness. Patient denies headaches, blurred vision, chest pains, shortness of breath, or weakness. Denies any side effects from medication and is content with current medication.   Hyperlipidemia Patient is coming in for recheck of his hyperlipidemia. The patient is currently taking atorvastatin. They deny any issues with myalgias or history of liver damage from it. They deny any focal numbness or weakness or chest pain.   His A. fib and is on Coumadin is coming in for recheck, patient denies any bruising bleeding or palpitations or flutters.  Patient has a chronic cough and wants a refill on Tessalon Perles.  He says is mild denies any major issues  Relevant past medical, surgical,  family and social history reviewed and updated as indicated. Interim medical history since our last visit reviewed. Allergies and medications reviewed and updated.  Review of Systems  Constitutional: Negative for chills and fever.  Respiratory: Negative for shortness of breath and wheezing.   Cardiovascular: Negative for chest pain and leg swelling.  Gastrointestinal: Positive for abdominal distention. Negative for abdominal pain, constipation, diarrhea, nausea and vomiting.  Musculoskeletal: Negative for back pain and gait problem.  Skin: Negative for rash.  Neurological: Negative for dizziness, weakness, light-headedness and headaches.  All other systems reviewed and are negative.   Per HPI unless specifically indicated above   Allergies as of 09/03/2018      Reactions   Codeine    Headache      Medication List       Accurate as of September 03, 2018  1:28 PM. If you have any questions, ask your nurse or doctor.        allopurinol 300 MG tablet Commonly known as: ZYLOPRIM TAKE 1 TABLET BY MOUTH EVERY DAY   atorvastatin 20 MG tablet Commonly known as: LIPITOR 1 tablet daily What changed: See the new instructions. Changed by: [name] Boyce Keltner, MD   azelastine 0.05 % ophthalmic solution Commonly known as: OPTIVAR PLACE 2 DROPS INTO BOTH EYES 2 (TWO) TIMES DAILY.   benzonatate 100 MG capsule Commonly known as: Tessalon Perles Take 1 capsule (100 mg total)  by mouth 3 (three) times daily as needed for cough.   blood glucose meter kit and supplies Kit Dispense based on patient and insurance preference. Use up to four times daily as directed. (FOR ICD-9 250.00, 250.01).   colchicine 0.6 MG tablet Take 2 tablets st first sign of gout flare. May take an additional tablet in 1 hour if needed.  Max of 3 tablets in 24 hours.   Contour Next Test test strip Generic drug: glucose blood USE TO CHECK BG DAILY   diltiazem 180 MG 24 hr capsule Commonly known as: CARDIZEM CD  TAKE 1 CAPSULE BY MOUTH EVERY DAY   Farxiga 5 MG Tabs tablet Generic drug: dapagliflozin propanediol Take 5 mg by mouth daily. What changed: how much to take Changed by: [name] Rohil Lesch, MD   ferrous sulfate 325 (65 FE) MG EC tablet Take 325 mg by mouth 2 (two) times daily.   furosemide 80 MG tablet Commonly known as: LASIX TAKE 1 TABLET BY MOUTH IN MORNING AND 1/2 TABLET IN THE EVENING   insulin degludec 100 UNIT/ML Sopn FlexTouch Pen Commonly known as: TRESIBA INJECT 0.26 MLS (26 UNITS TOTAL) INTO THE SKIN DAILY.   losartan 50 MG tablet Commonly known as: COZAAR TAKE 1 TABLET BY MOUTH EVERY DAY   metFORMIN 1000 MG tablet Commonly known as: GLUCOPHAGE TAKE 0.5 TABLETS (500 MG TOTAL) BY MOUTH 2 (TWO) TIMES DAILY WITH A MEAL.   metoprolol tartrate 50 MG tablet Commonly known as: LOPRESSOR Take 1 tablet (50 mg total) by mouth 2 (two) times daily.   Microlet Lancets Misc USE TO CHECK BLOOD GLUCOSE ONCE DAILY   omeprazole 20 MG capsule Commonly known as: PRILOSEC Take 1 capsule (20 mg total) by mouth daily.   polyvinyl alcohol 1.4 % ophthalmic solution Commonly known as: LIQUIFILM TEARS Place 1 drop into both eyes as needed for dry eyes.   potassium chloride SA 20 MEQ tablet Commonly known as: Klor-Con M20 TAKE 1/2 TABLET (10 MEQ TOTAL) BY MOUTH DAILY. NEED OV. *INS WILL COVER 1/24 OR LATER*   Trulicity 1.5 PY/0.9XI Sopn Generic drug: Dulaglutide Inject 1.5 mg into the skin once a week.   warfarin 5 MG tablet Commonly known as: COUMADIN Take as directed by the anticoagulation clinic. If you are unsure how to take this medication, talk to your nurse or doctor. Original instructions: TAKE AS DIRECTED        Objective:   BP 121/72   Pulse 80   Temp 97.8 F (36.6 C) (Temporal)   Ht _0  (1.626 m)   Wt 293 lb (132.9 kg)   BMI 50.29 kg/m   Wt Readings from Last 3 Encounters:  09/03/18 293 lb (132.9 kg)  05/02/18 291 lb 12.8 oz (132.4 kg)  02/26/18  285 lb 9.6 oz (129.5 kg)    Physical Exam Vitals signs and nursing note reviewed.  Constitutional:      General: He is not in acute distress.    Appearance: He is well-developed. He is not diaphoretic.  Eyes:     General: No scleral icterus.    Conjunctiva/sclera: Conjunctivae normal.  Neck:     Musculoskeletal: Neck supple.     Thyroid: No thyromegaly.  Cardiovascular:     Rate and Rhythm: Normal rate. Rhythm irregular.     Heart sounds: Normal heart sounds. No murmur.  Pulmonary:     Effort: Pulmonary effort is normal. No respiratory distress.     Breath sounds: Normal breath sounds. No wheezing.  Abdominal:  General: There is distension (Vertical midline incision with ventral hernia that is reducible near the umbilicus).     Palpations: Abdomen is soft. There is no shifting dullness, fluid wave or mass.     Tenderness: There is no abdominal tenderness. There is no right CVA tenderness, left CVA tenderness, guarding or rebound.  Musculoskeletal: Normal range of motion.  Lymphadenopathy:     Cervical: No cervical adenopathy.  Skin:    General: Skin is warm and dry.     Findings: No rash.  Neurological:     Mental Status: He is alert and oriented to person, place, and time.     Coordination: Coordination normal.  Psychiatric:        Behavior: Behavior normal.     Description   Continue 5 mg on Tuesdays and Thursdays and 7.5 mg the rest of the days  INR was 3.5 today (goal is 2-3) Recheck in 4 weeks        Assessment & Plan:   Problem List Items Addressed This Visit      Cardiovascular and Mediastinum   Essential hypertension   Relevant Medications   metoprolol tartrate (LOPRESSOR) 50 MG tablet   atorvastatin (LIPITOR) 20 MG tablet     Endocrine   DM (diabetes mellitus), type 2, uncontrolled, with renal complications (HCC) - Primary (Chronic)   Relevant Medications   dapagliflozin propanediol (FARXIGA) 5 MG TABS tablet   atorvastatin (LIPITOR) 20 MG  tablet   Other Relevant Orders   CMP14+EGFR (Completed)   Bayer DCA Hb A1c Waived (Completed)   CKD stage 3 due to type 2 diabetes mellitus (HCC)   Relevant Medications   dapagliflozin propanediol (FARXIGA) 5 MG TABS tablet   atorvastatin (LIPITOR) 20 MG tablet   Other Relevant Orders   CMP14+EGFR (Completed)   Hyperlipidemia associated with type 2 diabetes mellitus (HCC)   Relevant Medications   dapagliflozin propanediol (FARXIGA) 5 MG TABS tablet   atorvastatin (LIPITOR) 20 MG tablet     Other   Gout (Chronic)   Relevant Orders   Uric acid (Completed)   Anticoagulated on Coumadin (Chronic)   Relevant Orders   CoaguChek XS/INR Waived (Completed)    Other Visit Diagnoses    Cough       Relevant Medications   benzonatate (TESSALON PERLES) 100 MG capsule   Abdominal distention       Relevant Orders   US Abdomen Complete (Completed)      We will send patient for ultrasound of the abdomen and possible to rule out any ascites or any other causes in the abdomen.  May consider CT scan in the future if persists and no answers from ultrasound Follow up plan: Return in about 3 months (around 12/03/2018), or if symptoms worsen or fail to improve, for Diabetes and hypertension recheck.  Counseling provided for all of the vaccine components Orders Placed This Encounter  Procedures  . US Abdomen Complete  . CoaguChek XS/INR Waived  . CMP14+EGFR  . Bayer DCA Hb A1c Waived  . Uric acid    Caryl Pina, MD [facility] 09/03/2018, 1:28 PM

## 2018-09-04 ENCOUNTER — Other Ambulatory Visit: Payer: Self-pay | Admitting: *Deleted

## 2018-09-04 LAB — CMP14+EGFR
ALT: 11 IU/L (ref 0–44)
AST: 19 IU/L (ref 0–40)
Albumin/Globulin Ratio: 1.4 (ref 1.2–2.2)
Albumin: 3.9 g/dL (ref 3.8–4.8)
Alkaline Phosphatase: 79 IU/L (ref 39–117)
BUN/Creatinine Ratio: 23 (ref 10–24)
BUN: 45 mg/dL — ABNORMAL HIGH (ref 8–27)
Bilirubin Total: 0.5 mg/dL (ref 0.0–1.2)
CO2: 27 mmol/L (ref 20–29)
Calcium: 9.2 mg/dL (ref 8.6–10.2)
Chloride: 100 mmol/L (ref 96–106)
Creatinine, Ser: 1.93 mg/dL — ABNORMAL HIGH (ref 0.76–1.27)
GFR calc Af Amer: 41 mL/min/{1.73_m2} — ABNORMAL LOW (ref >59)
GFR calc non Af Amer: 35 mL/min/{1.73_m2} — ABNORMAL LOW (ref >59)
Globulin, Total: 2.8 g/dL (ref 1.5–4.5)
Glucose: 107 mg/dL — ABNORMAL HIGH (ref 65–99)
Potassium: 4.2 mmol/L (ref 3.5–5.2)
Sodium: 143 mmol/L (ref 134–144)
Total Protein: 6.7 g/dL (ref 6.0–8.5)

## 2018-09-04 LAB — URIC ACID: Uric Acid: 6.8 mg/dL (ref 3.7–8.6)

## 2018-09-04 MED ORDER — INSULIN PEN NEEDLE 32G X 4 MM MISC: 3 refills | Status: DC

## 2018-09-05 ENCOUNTER — Other Ambulatory Visit: Payer: Self-pay

## 2018-09-05 DIAGNOSIS — K760 Fatty (change of) liver, not elsewhere classified: Secondary | ICD-10-CM

## 2018-09-10 ENCOUNTER — Other Ambulatory Visit: Payer: Self-pay | Admitting: Family Medicine

## 2018-09-12 ENCOUNTER — Encounter: Payer: Self-pay | Admitting: *Deleted

## 2018-09-13 ENCOUNTER — Other Ambulatory Visit: Payer: Self-pay | Admitting: Family Medicine

## 2018-09-13 ENCOUNTER — Other Ambulatory Visit: Payer: Self-pay | Admitting: Family

## 2018-10-05 ENCOUNTER — Other Ambulatory Visit: Payer: Self-pay | Admitting: Family Medicine

## 2018-10-11 ENCOUNTER — Ambulatory Visit: Payer: Medicare Other | Admitting: Gastroenterology

## 2018-10-26 ENCOUNTER — Other Ambulatory Visit: Payer: Self-pay | Admitting: Family Medicine

## 2018-10-29 ENCOUNTER — Other Ambulatory Visit: Payer: Self-pay | Admitting: Family Medicine

## 2018-11-01 DIAGNOSIS — E6609 Other obesity due to excess calories: Secondary | ICD-10-CM | POA: Diagnosis not present

## 2018-11-01 DIAGNOSIS — N189 Chronic kidney disease, unspecified: Secondary | ICD-10-CM | POA: Diagnosis not present

## 2018-11-01 DIAGNOSIS — M1A9XX1 Chronic gout, unspecified, with tophus (tophi): Secondary | ICD-10-CM | POA: Diagnosis not present

## 2018-11-01 DIAGNOSIS — I129 Hypertensive chronic kidney disease with stage 1 through stage 4 chronic kidney disease, or unspecified chronic kidney disease: Secondary | ICD-10-CM | POA: Diagnosis not present

## 2018-11-01 DIAGNOSIS — R809 Proteinuria, unspecified: Secondary | ICD-10-CM | POA: Diagnosis not present

## 2018-11-01 DIAGNOSIS — E1129 Type 2 diabetes mellitus with other diabetic kidney complication: Secondary | ICD-10-CM | POA: Diagnosis not present

## 2018-11-01 DIAGNOSIS — E559 Vitamin D deficiency, unspecified: Secondary | ICD-10-CM | POA: Diagnosis not present

## 2018-11-01 DIAGNOSIS — E1122 Type 2 diabetes mellitus with diabetic chronic kidney disease: Secondary | ICD-10-CM | POA: Diagnosis not present

## 2018-11-23 ENCOUNTER — Ambulatory Visit: Payer: Medicare Other | Admitting: Gastroenterology

## 2018-12-06 ENCOUNTER — Ambulatory Visit (INDEPENDENT_AMBULATORY_CARE_PROVIDER_SITE_OTHER): Payer: Medicare Other | Admitting: Nurse Practitioner

## 2018-12-06 ENCOUNTER — Encounter: Payer: Self-pay | Admitting: Nurse Practitioner

## 2018-12-06 DIAGNOSIS — R531 Weakness: Secondary | ICD-10-CM

## 2018-12-06 DIAGNOSIS — R42 Dizziness and giddiness: Secondary | ICD-10-CM

## 2018-12-06 NOTE — Progress Notes (Signed)
   Virtual Visit via telephone Note Due to COVID-19 pandemic this visit was conducted virtually. This visit type was conducted due to national recommendations for restrictions regarding the COVID-19 Pandemic (e.g. social distancing, sheltering in place) in an effort to limit this patient's exposure and mitigate transmission in our community. All issues noted in this document were discussed and addressed.  A physical exam was not performed with this format.  I connected with William Flores on 12/06/18 at 9:05 by telephone and verified that I am speaking with the correct person using two identifiers. William Flores is currently located at home and no one is currently with hom during visit. The provider, Mary-Margaret Hassell Done, FNP is located in their office at time of visit.  I discussed the limitations, risks, security and privacy concerns of performing an evaluation and management service by telephone and the availability of in person appointments. I also discussed with the patient that there may be a patient responsible charge related to this service. The patient expressed understanding and agreed to proceed.   History and Present Illness:   Chief Complaint: Fatigue   HPI Patient calls in today c/o of feeling weak. This started about 3 weeks ago when he had some meds changed. He saw nephrologist increased his losartan from 22m to 106ma day. He has not checked his blood pressure , but is able to do that. He says he gets dizzy feeling when he rises form sitting to standing. He says he feels better today.    Review of Systems  Constitutional: Negative.   HENT: Negative.   Eyes: Negative for blurred vision.  Respiratory: Negative.   Cardiovascular: Negative.   Genitourinary: Negative.   Neurological: Positive for dizziness. Negative for headaches.  Psychiatric/Behavioral: Negative.   All other systems reviewed and are negative.    Observations/Objective: Alert and oriented- answers  all questions appropriately No distress    Assessment and Plan: RoKALE RONDEAUn today with chief complaint of Fatigue   1. Vertigo  2. Weakness Rise slowly from sitting to standing Check blood pressure when he feels dizzy Continue losartan as prescribed Appointment made for dec 18 to see PCP for labs and to discuss blood pressure   Follow Up Instructions: Prn and keep appointment on December 18    I discussed the assessment and treatment plan with the patient. The patient was provided an opportunity to ask questions and all were answered. The patient agreed with the plan and demonstrated an understanding of the instructions.   The patient was advised to call back or seek an in-person evaluation if the symptoms worsen or if the condition fails to improve as anticipated.  The above assessment and management plan was discussed with the patient. The patient verbalized understanding of and has agreed to the management plan. Patient is aware to call the clinic if symptoms persist or worsen. Patient is aware when to return to the clinic for a follow-up visit. Patient educated on when it is appropriate to go to the emergency department.   Time call ended:  9:18  I provided 13 minutes of non-face-to-face time during this encounter.    Mary-Margaret MaHassell DoneFNP

## 2018-12-10 ENCOUNTER — Other Ambulatory Visit: Payer: Self-pay | Admitting: Family Medicine

## 2018-12-18 ENCOUNTER — Other Ambulatory Visit: Payer: Self-pay | Admitting: Family Medicine

## 2018-12-20 ENCOUNTER — Other Ambulatory Visit: Payer: Self-pay

## 2018-12-20 ENCOUNTER — Encounter: Payer: Self-pay | Admitting: Family

## 2018-12-20 ENCOUNTER — Ambulatory Visit (INDEPENDENT_AMBULATORY_CARE_PROVIDER_SITE_OTHER): Payer: Medicare Other | Admitting: Family

## 2018-12-20 DIAGNOSIS — R42 Dizziness and giddiness: Secondary | ICD-10-CM

## 2018-12-20 DIAGNOSIS — R5383 Other fatigue: Secondary | ICD-10-CM

## 2018-12-20 DIAGNOSIS — I10 Essential (primary) hypertension: Secondary | ICD-10-CM

## 2018-12-20 NOTE — Progress Notes (Signed)
Virtual Visit via telephone Note Due to COVID-19 pandemic this visit was conducted virtually. This visit type was conducted due to national recommendations for restrictions regarding the COVID-19 Pandemic (e.g. social distancing, sheltering in place) in an effort to limit this patient's exposure and mitigate transmission in our community. All issues noted in this document were discussed and addressed.  A physical exam was not performed with this format.  I connected with William Flores on 12/20/18 at 1:15 pm by telephone and verified that I am speaking with the correct person using two identifiers. William Flores is currently located at home and no one is currently with him during visit. The provider, Evelina Dun, FNP is located in their office at time of visit.  I discussed the limitations, risks, security and privacy concerns of performing an evaluation and management service by telephone and the availability of in person appointments. I also discussed with the patient that there may be a patient responsible charge related to this service. The patient expressed understanding and agreed to proceed.   History and Present Illness:  PT calls the office today with fatigue and intermittent dizziness that started a few weeks ago. He reports he has intermittent dizziness for a few seconds when standing up.  He states he has not left his house in 4 weeks and feels like this is causing his fatigue.   His losartan was increased to 100 mg from 50 mg in October. He states his BP needs batteries.  Dizziness This is a new problem. The current episode started 1 to 4 weeks ago. The problem occurs intermittently. The problem has been unchanged. Associated symptoms include coughing ("chronic") and fatigue. Pertinent negatives include no chest pain, congestion, diaphoresis, headaches, myalgias, nausea, urinary symptoms or vomiting. Treatments tried: standing up, then goes away. The treatment provided mild relief.    Hypertension This is a chronic problem. The current episode started more than 1 year ago. Associated symptoms include malaise/fatigue. Pertinent negatives include no chest pain, headaches, PND or shortness of breath. Risk factors for coronary artery disease include obesity and male gender. The current treatment provides mild improvement. There is no history of heart failure.      Review of Systems  Constitutional: Positive for fatigue and malaise/fatigue. Negative for diaphoresis.  HENT: Negative for congestion.   Respiratory: Positive for cough ("chronic"). Negative for shortness of breath.   Cardiovascular: Negative for chest pain and PND.  Gastrointestinal: Negative for nausea and vomiting.  Musculoskeletal: Negative for myalgias.  Neurological: Positive for dizziness. Negative for headaches.  All other systems reviewed and are negative.    Observations/Objective: No SOB or distress noted   Assessment and Plan: 1. Dizziness - CMP14+EGFR - CBC with Differential/Platelet  2. Fatigue, unspecified type - CMP14+EGFR - CBC with Differential/Platelet  3. Essential hypertension - CMP14+EGFR - CBC with Differential/Platelet  Pt will come and get labs drawn  While he is here will recheck his BP Force fluids Rest    I discussed the assessment and treatment plan with the patient. The patient was provided an opportunity to ask questions and all were answered. The patient agreed with the plan and demonstrated an understanding of the instructions.   The patient was advised to call back or seek an in-person evaluation if the symptoms worsen or if the condition fails to improve as anticipated.  The above assessment and management plan was discussed with the patient. The patient verbalized understanding of and has agreed to the management plan. Patient is  aware to call the clinic if symptoms persist or worsen. Patient is aware when to return to the clinic for a follow-up visit. Patient  educated on when it is appropriate to go to the emergency department.   Time call ended:  1:30 pm  I provided 15 minutes of non-face-to-face time during this encounter.    Evelina Dun, FNP

## 2018-12-21 ENCOUNTER — Ambulatory Visit: Payer: Self-pay | Admitting: Family Medicine

## 2018-12-25 ENCOUNTER — Emergency Department (HOSPITAL_COMMUNITY): Payer: Medicare Other

## 2018-12-25 ENCOUNTER — Encounter (HOSPITAL_COMMUNITY): Payer: Self-pay | Admitting: *Deleted

## 2018-12-25 ENCOUNTER — Inpatient Hospital Stay (HOSPITAL_COMMUNITY)
Admission: EM | Admit: 2018-12-25 | Discharge: 2019-01-01 | DRG: 377 | Disposition: A | Discharge status: Skilled Nursing Facility | Payer: Medicare Other | Attending: Family Medicine | Admitting: Family Medicine

## 2018-12-25 ENCOUNTER — Other Ambulatory Visit: Payer: Self-pay | Admitting: Family Medicine

## 2018-12-25 DIAGNOSIS — N183 Chronic kidney disease, stage 3 unspecified: Secondary | ICD-10-CM | POA: Diagnosis not present

## 2018-12-25 DIAGNOSIS — I361 Nonrheumatic tricuspid (valve) insufficiency: Secondary | ICD-10-CM | POA: Diagnosis not present

## 2018-12-25 DIAGNOSIS — E785 Hyperlipidemia, unspecified: Secondary | ICD-10-CM | POA: Diagnosis present

## 2018-12-25 DIAGNOSIS — K922 Gastrointestinal hemorrhage, unspecified: Principal | ICD-10-CM | POA: Diagnosis present

## 2018-12-25 DIAGNOSIS — K3189 Other diseases of stomach and duodenum: Secondary | ICD-10-CM | POA: Diagnosis not present

## 2018-12-25 DIAGNOSIS — R41841 Cognitive communication deficit: Secondary | ICD-10-CM | POA: Diagnosis not present

## 2018-12-25 DIAGNOSIS — J9601 Acute respiratory failure with hypoxia: Secondary | ICD-10-CM | POA: Diagnosis present

## 2018-12-25 DIAGNOSIS — D62 Acute posthemorrhagic anemia: Secondary | ICD-10-CM | POA: Diagnosis present

## 2018-12-25 DIAGNOSIS — I272 Pulmonary hypertension, unspecified: Secondary | ICD-10-CM | POA: Diagnosis present

## 2018-12-25 DIAGNOSIS — Z85038 Personal history of other malignant neoplasm of large intestine: Secondary | ICD-10-CM | POA: Diagnosis present

## 2018-12-25 DIAGNOSIS — K2950 Unspecified chronic gastritis without bleeding: Secondary | ICD-10-CM | POA: Diagnosis not present

## 2018-12-25 DIAGNOSIS — E1129 Type 2 diabetes mellitus with other diabetic kidney complication: Secondary | ICD-10-CM | POA: Diagnosis present

## 2018-12-25 DIAGNOSIS — R05 Cough: Secondary | ICD-10-CM

## 2018-12-25 DIAGNOSIS — IMO0002 Reserved for concepts with insufficient information to code with codable children: Secondary | ICD-10-CM

## 2018-12-25 DIAGNOSIS — E1122 Type 2 diabetes mellitus with diabetic chronic kidney disease: Secondary | ICD-10-CM | POA: Diagnosis present

## 2018-12-25 DIAGNOSIS — K635 Polyp of colon: Secondary | ICD-10-CM | POA: Diagnosis present

## 2018-12-25 DIAGNOSIS — N390 Urinary tract infection, site not specified: Secondary | ICD-10-CM | POA: Diagnosis present

## 2018-12-25 DIAGNOSIS — I959 Hypotension, unspecified: Secondary | ICD-10-CM | POA: Diagnosis present

## 2018-12-25 DIAGNOSIS — Z8249 Family history of ischemic heart disease and other diseases of the circulatory system: Secondary | ICD-10-CM

## 2018-12-25 DIAGNOSIS — I5032 Chronic diastolic (congestive) heart failure: Secondary | ICD-10-CM | POA: Diagnosis present

## 2018-12-25 DIAGNOSIS — Z803 Family history of malignant neoplasm of breast: Secondary | ICD-10-CM

## 2018-12-25 DIAGNOSIS — I2729 Other secondary pulmonary hypertension: Secondary | ICD-10-CM | POA: Diagnosis present

## 2018-12-25 DIAGNOSIS — J189 Pneumonia, unspecified organism: Secondary | ICD-10-CM | POA: Diagnosis present

## 2018-12-25 DIAGNOSIS — D123 Benign neoplasm of transverse colon: Secondary | ICD-10-CM | POA: Diagnosis not present

## 2018-12-25 DIAGNOSIS — D509 Iron deficiency anemia, unspecified: Secondary | ICD-10-CM | POA: Diagnosis present

## 2018-12-25 DIAGNOSIS — D472 Monoclonal gammopathy: Secondary | ICD-10-CM | POA: Diagnosis present

## 2018-12-25 DIAGNOSIS — Z20828 Contact with and (suspected) exposure to other viral communicable diseases: Secondary | ICD-10-CM | POA: Diagnosis present

## 2018-12-25 DIAGNOSIS — F17210 Nicotine dependence, cigarettes, uncomplicated: Secondary | ICD-10-CM | POA: Diagnosis present

## 2018-12-25 DIAGNOSIS — I2510 Atherosclerotic heart disease of native coronary artery without angina pectoris: Secondary | ICD-10-CM | POA: Diagnosis present

## 2018-12-25 DIAGNOSIS — I428 Other cardiomyopathies: Secondary | ICD-10-CM | POA: Diagnosis present

## 2018-12-25 DIAGNOSIS — I340 Nonrheumatic mitral (valve) insufficiency: Secondary | ICD-10-CM | POA: Diagnosis not present

## 2018-12-25 DIAGNOSIS — R630 Anorexia: Secondary | ICD-10-CM | POA: Diagnosis not present

## 2018-12-25 DIAGNOSIS — Z6841 Body Mass Index (BMI) 40.0 and over, adult: Secondary | ICD-10-CM | POA: Diagnosis not present

## 2018-12-25 DIAGNOSIS — I130 Hypertensive heart and chronic kidney disease with heart failure and stage 1 through stage 4 chronic kidney disease, or unspecified chronic kidney disease: Secondary | ICD-10-CM | POA: Diagnosis not present

## 2018-12-25 DIAGNOSIS — N179 Acute kidney failure, unspecified: Secondary | ICD-10-CM | POA: Diagnosis present

## 2018-12-25 DIAGNOSIS — I4891 Unspecified atrial fibrillation: Secondary | ICD-10-CM | POA: Diagnosis present

## 2018-12-25 DIAGNOSIS — I482 Chronic atrial fibrillation, unspecified: Secondary | ICD-10-CM | POA: Diagnosis present

## 2018-12-25 DIAGNOSIS — G4733 Obstructive sleep apnea (adult) (pediatric): Secondary | ICD-10-CM | POA: Diagnosis present

## 2018-12-25 DIAGNOSIS — R059 Cough, unspecified: Secondary | ICD-10-CM

## 2018-12-25 DIAGNOSIS — Z79899 Other long term (current) drug therapy: Secondary | ICD-10-CM

## 2018-12-25 DIAGNOSIS — D649 Anemia, unspecified: Secondary | ICD-10-CM | POA: Diagnosis present

## 2018-12-25 DIAGNOSIS — R791 Abnormal coagulation profile: Secondary | ICD-10-CM

## 2018-12-25 DIAGNOSIS — I10 Essential (primary) hypertension: Secondary | ICD-10-CM | POA: Diagnosis not present

## 2018-12-25 DIAGNOSIS — E662 Morbid (severe) obesity with alveolar hypoventilation: Secondary | ICD-10-CM | POA: Diagnosis present

## 2018-12-25 DIAGNOSIS — E1165 Type 2 diabetes mellitus with hyperglycemia: Secondary | ICD-10-CM | POA: Diagnosis present

## 2018-12-25 DIAGNOSIS — Z833 Family history of diabetes mellitus: Secondary | ICD-10-CM

## 2018-12-25 DIAGNOSIS — D689 Coagulation defect, unspecified: Secondary | ICD-10-CM | POA: Diagnosis present

## 2018-12-25 DIAGNOSIS — M6281 Muscle weakness (generalized): Secondary | ICD-10-CM | POA: Diagnosis not present

## 2018-12-25 DIAGNOSIS — K317 Polyp of stomach and duodenum: Secondary | ICD-10-CM | POA: Diagnosis not present

## 2018-12-25 DIAGNOSIS — Z794 Long term (current) use of insulin: Secondary | ICD-10-CM

## 2018-12-25 DIAGNOSIS — Z9049 Acquired absence of other specified parts of digestive tract: Secondary | ICD-10-CM

## 2018-12-25 DIAGNOSIS — Z23 Encounter for immunization: Secondary | ICD-10-CM | POA: Diagnosis present

## 2018-12-25 LAB — URINALYSIS, ROUTINE W REFLEX MICROSCOPIC
Bacteria, UA: NONE SEEN
Bilirubin Urine: NEGATIVE
Glucose, UA: NEGATIVE mg/dL
Ketones, ur: NEGATIVE mg/dL
Nitrite: POSITIVE — AB
Protein, ur: NEGATIVE mg/dL
Specific Gravity, Urine: 1.013 (ref 1.005–1.030)
pH: 5 (ref 5.0–8.0)

## 2018-12-25 LAB — TROPONIN I (HIGH SENSITIVITY)
Troponin I (High Sensitivity): 15 ng/L (ref <18)
Troponin I (High Sensitivity): 15 ng/L (ref <18)

## 2018-12-25 LAB — COMPREHENSIVE METABOLIC PANEL
ALT: 10 U/L (ref 0–44)
AST: 12 U/L — ABNORMAL LOW (ref 15–41)
Albumin: 3.1 g/dL — ABNORMAL LOW (ref 3.5–5.0)
Alkaline Phosphatase: 54 U/L (ref 38–126)
Anion gap: 9 (ref 5–15)
BUN: 78 mg/dL — ABNORMAL HIGH (ref 8–23)
CO2: 19 mmol/L — ABNORMAL LOW (ref 22–32)
Calcium: 8.4 mg/dL — ABNORMAL LOW (ref 8.9–10.3)
Chloride: 109 mmol/L (ref 98–111)
Creatinine, Ser: 2.31 mg/dL — ABNORMAL HIGH (ref 0.61–1.24)
GFR calc Af Amer: 33 mL/min — ABNORMAL LOW (ref >60)
GFR calc non Af Amer: 28 mL/min — ABNORMAL LOW (ref >60)
Glucose, Bld: 143 mg/dL — ABNORMAL HIGH (ref 70–99)
Potassium: 4.7 mmol/L (ref 3.5–5.1)
Sodium: 137 mmol/L (ref 135–145)
Total Bilirubin: 1 mg/dL (ref 0.3–1.2)
Total Protein: 6.4 g/dL — ABNORMAL LOW (ref 6.5–8.1)

## 2018-12-25 LAB — POC SARS CORONAVIRUS 2 AG -  ED: SARS Coronavirus 2 Ag: NEGATIVE

## 2018-12-25 LAB — CBC
HCT: 19.5 % — ABNORMAL LOW (ref 39.0–52.0)
Hemoglobin: 5.1 g/dL — CL (ref 13.0–17.0)
MCH: 22.2 pg — ABNORMAL LOW (ref 26.0–34.0)
MCHC: 26.2 g/dL — ABNORMAL LOW (ref 30.0–36.0)
MCV: 84.8 fL (ref 80.0–100.0)
Platelets: 184 10*3/uL (ref 150–400)
RBC: 2.3 MIL/uL — ABNORMAL LOW (ref 4.22–5.81)
RDW: 22.5 % — ABNORMAL HIGH (ref 11.5–15.5)
WBC: 7.6 10*3/uL (ref 4.0–10.5)
nRBC: 0.5 % — ABNORMAL HIGH (ref 0.0–0.2)

## 2018-12-25 LAB — BRAIN NATRIURETIC PEPTIDE: B Natriuretic Peptide: 291 pg/mL — ABNORMAL HIGH (ref 0.0–100.0)

## 2018-12-25 LAB — LACTIC ACID, PLASMA: Lactic Acid, Venous: 1.8 mmol/L (ref 0.5–1.9)

## 2018-12-25 LAB — CBG MONITORING, ED: Glucose-Capillary: 123 mg/dL — ABNORMAL HIGH (ref 70–99)

## 2018-12-25 LAB — PROTIME-INR
INR: >10 (ref 0.8–1.2)
Prothrombin Time: >90 seconds — ABNORMAL HIGH (ref 11.4–15.2)

## 2018-12-25 LAB — ABO/RH: ABO/RH(D): O POS

## 2018-12-25 LAB — PREPARE RBC (CROSSMATCH)

## 2018-12-25 MED ORDER — SODIUM CHLORIDE 0.9 % IV SOLN
2.0000 g | INTRAVENOUS | Status: DC
  Administered 2018-12-26 – 2018-12-30 (×5): 2 g via INTRAVENOUS
  Filled 2018-12-25 (×5): qty 20

## 2018-12-25 MED ORDER — ACETAMINOPHEN 325 MG PO TABS: 650.0000 mg | ORAL_TABLET | Freq: Four times a day (QID) | ORAL | Status: DC | PRN

## 2018-12-25 MED ORDER — PROTHROMBIN COMPLEX CONC HUMAN 500 UNITS IV KIT
2083.0000 [IU] | PACK | Status: AC
  Administered 2018-12-25: 2083 [IU] via INTRAVENOUS
  Filled 2018-12-25: qty 2083

## 2018-12-25 MED ORDER — SODIUM CHLORIDE 0.9 % IV SOLN
10.0000 mL/h | Freq: Once | INTRAVENOUS | Status: AC
  Administered 2018-12-25: 10 mL/h via INTRAVENOUS

## 2018-12-25 MED ORDER — INSULIN ASPART 100 UNIT/ML ~~LOC~~ SOLN
0.0000 [IU] | SUBCUTANEOUS | Status: DC
  Administered 2018-12-25: 2 [IU] via SUBCUTANEOUS
  Administered 2018-12-26: 5 [IU] via SUBCUTANEOUS
  Administered 2018-12-27 – 2018-12-28 (×3): 3 [IU] via SUBCUTANEOUS
  Filled 2018-12-25: qty 1

## 2018-12-25 MED ORDER — SODIUM CHLORIDE 0.9 % IV SOLN
500.0000 mg | INTRAVENOUS | Status: DC
  Administered 2018-12-25 – 2018-12-30 (×6): 500 mg via INTRAVENOUS
  Filled 2018-12-25 (×6): qty 500

## 2018-12-25 MED ORDER — VITAMIN K1 10 MG/ML IJ SOLN
10.0000 mg | INTRAVENOUS | Status: AC
  Administered 2018-12-25: 10 mg via INTRAVENOUS
  Filled 2018-12-25: qty 1

## 2018-12-25 MED ORDER — ONDANSETRON HCL 4 MG/2ML IJ SOLN
4.0000 mg | Freq: Four times a day (QID) | INTRAMUSCULAR | Status: DC | PRN
  Administered 2018-12-28: 11:00:00 4 mg via INTRAVENOUS
  Filled 2018-12-25: qty 2

## 2018-12-25 MED ORDER — POLYETHYLENE GLYCOL 3350 17 G PO PACK: 17.0000 g | PACK | Freq: Every day | ORAL | Status: DC | PRN

## 2018-12-25 MED ORDER — SODIUM CHLORIDE 0.9 % IV SOLN
2.0000 g | Freq: Once | INTRAVENOUS | Status: AC
  Administered 2018-12-25: 2 g via INTRAVENOUS
  Filled 2018-12-25: qty 20

## 2018-12-25 MED ORDER — ACETAMINOPHEN 650 MG RE SUPP: 650.0000 mg | Freq: Four times a day (QID) | RECTAL | Status: DC | PRN

## 2018-12-25 MED ORDER — ALLOPURINOL 300 MG PO TABS
300.0000 mg | ORAL_TABLET | Freq: Every day | ORAL | Status: DC
  Administered 2018-12-26 – 2019-01-01 (×7): 300 mg via ORAL
  Filled 2018-12-25 (×9): qty 1

## 2018-12-25 MED ORDER — ONDANSETRON HCL 4 MG PO TABS: 4.0000 mg | ORAL_TABLET | Freq: Four times a day (QID) | ORAL | Status: DC | PRN

## 2018-12-25 MED ORDER — PROTHROMBIN COMPLEX CONC HUMAN 500 UNITS IV KIT
2000.0000 [IU] | PACK | Status: DC
  Filled 2018-12-25: qty 2000

## 2018-12-25 MED ORDER — SODIUM CHLORIDE 0.9 % IV SOLN: INTRAVENOUS | Status: DC

## 2018-12-25 MED ORDER — VITAMIN K1 10 MG/ML IJ SOLN
INTRAMUSCULAR | Status: AC
  Filled 2018-12-25: qty 1

## 2018-12-25 MED ORDER — INFLUENZA VAC A&B SA ADJ QUAD 0.5 ML IM PRSY
0.5000 mL | PREFILLED_SYRINGE | INTRAMUSCULAR | Status: AC
  Administered 2018-12-26: 0.5 mL via INTRAMUSCULAR
  Filled 2018-12-25: qty 0.5

## 2018-12-25 MED ORDER — PANTOPRAZOLE SODIUM 40 MG IV SOLR: 40.0000 mg | INTRAVENOUS | Status: DC

## 2018-12-25 MED ORDER — ATORVASTATIN CALCIUM 20 MG PO TABS
20.0000 mg | ORAL_TABLET | Freq: Every day | ORAL | Status: DC
  Administered 2018-12-26 – 2018-12-31 (×6): 20 mg via ORAL
  Filled 2018-12-25 (×6): qty 1

## 2018-12-25 MED ORDER — PANTOPRAZOLE SODIUM 40 MG IV SOLR
40.0000 mg | Freq: Every day | INTRAVENOUS | Status: DC
  Administered 2018-12-25 – 2018-12-30 (×6): 40 mg via INTRAVENOUS
  Filled 2018-12-25 (×6): qty 40

## 2018-12-25 MED ORDER — SODIUM CHLORIDE 0.9 % IV SOLN: 500.0000 mg | INTRAVENOUS | Status: DC

## 2018-12-25 NOTE — H&P (Signed)
History and Physical    William Flores FTD:[id number] DOB: [birth year] DOA: 12/25/2018  PCP: [name], [name], MD   Patient coming from: Home  I have personally briefly reviewed patient's old medical records in [place]   Chief Complaint: Generalized weakness, dizziness, cough.  HPI: William Flores is a 68 y.o. male with medical history significant for hypertension, diabetes mellitus, nonischemic cardiomyopathy, CKD 3, atrial fibrillation, colon cancer,MGUS.  On chronic 2 L O2 at night. Patient presented to the ED with complaints of 2 days of generalized weakness and dizziness.  He also reports a cough- productive of whitish sputum.  He denies difficulty breathing.  He reports stable 2 pillow use.  Reports weight loss.  No leg swelling.  Reports some episodes of spitting up without actually vomiting the past 2 days only when he drank water.  He reports he has barely eaten over the past 2 days. He denies NSAID use, no abdominal pain.  Denies urinary symptoms.  No loose stools.  No blood in stools or black stools.  Until 2 days ago he was taking his Lasix daily.  ED Course: Blood pressure initially 97/58 improved to 116/52.  O2 sats initially 85% on room air, improved with his home 2 L nasal cannula to greater than 92%.   Hemoglobin 5.1.  Creatinine elevated 2.3.  Normal lactic acid 1.8.  INR > 10.  Portable chest x-ray-findings compatible with a layering left pleural effusion and airspace disease which could be atelectasis or pneumonia, cardiomegaly and pulmonary vascular congestion.  IV ceftriaxone and azithromycin, Kcentra, IV vitamin K 50m given in ED and 2 units PRBC ordered for transfusion.  Hospitalist to admit for acute symptomatic anemia, acute kidney injury and likely pneumonia with supratherapeutic INR.  Review of Systems: As per HPI all other systems reviewed and negative.  Past Medical History:  Diagnosis Date  . CAD (coronary artery disease)    LHC 2/08:  mRCA 50%, o/w  no sig CAD, EF 25%  . Cardiomyopathy ([facility]    EF 35-40% in 2/16 in setting of AF with RVR >> echo 5/16 with improved LVF with EF 65-70%  . Chronic atrial fibrillation ([facility]   . Chronic diastolic heart failure (HCC)    HFPF - EF ~65-70% (OFTEN EXACERBATED BY AFIB)  . CKD (chronic kidney disease)    hx of worsening renal failure and hyperK+ in setting of acute diast CHF >> required CVVHD in 4/16 and dialysis x 1 in 5/16  . CKD stage 3 due to type 2 diabetes mellitus ([facility] 09/17/2015  . Colon cancer ([facility]   . Colon polyps 09/21/2012   Tubular adenoma  . Diabetes ([facility]   . History of echocardiogram    Echo 05/16/14:  mild LVH, vigorous LVF, EF 65-70%, no RWMA, mean AV gradient 9 mmHg, MAC, mild MR, mod LAE, mild RVE, mild reduced RVF, severe RAE  . Hyperlipidemia   . Hypertension   . Obesity hypoventilation syndrome ([facility]   . Renal insufficiency   . Sleep apnea     Past Surgical History:  Procedure Laterality Date  . CIRCUMCISION    . COLON RESECTION    . COLONOSCOPY N/A 09/21/2012   Procedure: COLONOSCOPY;  Surgeon: RInda Castle MD;  Location: [facility];  Service: Endoscopy;  Laterality: N/A;  . [place]  03/04/2010   abnormal study     reports that he quit smoking about 35 years ago. He has never used smokeless tobacco. He reports that he does  not drink alcohol or use drugs.  Allergies  Allergen Reactions  . Codeine     Headache     Family History  Problem Relation Age of Onset  . Breast cancer Mother   . Diabetes Mother   . Heart attack Father     Prior to Admission medications   Medication Sig Start Date End Date Taking? Authorizing Provider  allopurinol (ZYLOPRIM) 300 MG tablet TAKE 1 TABLET BY MOUTH EVERY DAY Patient taking differently: Take 300 mg by mouth daily.  10/26/18  Yes [name], [name], MD  atorvastatin (LIPITOR) 20 MG tablet 1 tablet daily Patient taking differently: Take 20 mg by mouth daily.  09/03/18  Yes [name], [name], MD    azelastine (OPTIVAR) 0.05 % ophthalmic solution PLACE 2 DROPS INTO BOTH EYES 2 (TWO) TIMES DAILY. Patient taking differently: Place 2 drops into the right eye 2 (two) times daily.  07/30/18  Yes [name], [name], MD  colchicine 0.6 MG tablet Take 2 tablets st first sign of gout flare. May take an additional tablet in 1 hour if needed.  Max of 3 tablets in 24 hours. 03/09/16   Timmothy Euler, MD  dapagliflozin propanediol (FARXIGA) 5 MG TABS tablet Take 5 mg by mouth daily. 09/03/18   [name], [name], MD  diltiazem (CARDIZEM CD) 180 MG 24 hr capsule TAKE 1 CAPSULE BY MOUTH EVERY DAY 10/26/18   [name], [name], MD  ferrous sulfate 325 (65 FE) MG EC tablet Take 325 mg by mouth 2 (two) times daily.    [provider]  furosemide (LASIX) 80 MG tablet TAKE 1 TABLET BY MOUTH IN MORNING AND 1/2 TABLET IN THE EVENING 12/19/18   [name], [name], MD  Insulin Pen Needle 32G X 4 MM MISC Use to give insulin daily Dx E11.29 09/04/18   [name], [name], MD  losartan (COZAAR) 100 MG tablet Take 100 mg by mouth daily.    [provider]  metFORMIN (GLUCOPHAGE) 1000 MG tablet TAKE 0.5 TABLETS (500 MG TOTAL) BY MOUTH 2 (TWO) TIMES DAILY WITH A MEAL. 12/11/18   [name], [name], MD  metoprolol tartrate (LOPRESSOR) 50 MG tablet Take 1 tablet (50 mg total) by mouth 2 (two) times daily. 09/03/18   [name], [name], MD  MICROLET LANCETS MISC USE TO CHECK BLOOD GLUCOSE ONCE DAILY 01/27/16   [provider]  omeprazole (PRILOSEC) 20 MG capsule Take 1 capsule (20 mg total) by mouth daily. 02/26/18   [name], [name], MD  polyvinyl alcohol (LIQUIFILM TEARS) 1.4 % ophthalmic solution Place 1 drop into both eyes as needed for dry eyes. 04/23/14   Rai, Ripudeep K, MD  potassium chloride SA (KLOR-CON M20) 20 MEQ tablet TAKE 1/2 TABLET (10 MEQ TOTAL) BY MOUTH DAILY. NEED OV. *INS WILL COVER 4/19 OR LATER* 12/25/18   [name], [name], MD  TRESIBA FLEXTOUCH 100 UNIT/ML SOPN FlexTouch  Pen INJECT 0.26 MLS (26 UNITS TOTAL) INTO THE SKIN DAILY. 10/29/18   [name], [name], MD  TRULICITY 1.5 UE/4.5WU SOPN INJECT 1.5 MG INTO THE SKIN ONCE A WEEK. 12/11/18   [name], [name], MD  warfarin (COUMADIN) 5 MG tablet TAKE AS DIRECTED 12/11/18   [name], [name], MD  diltiazem [facility]) 240 MG 24 hr capsule TAKE 1 CAPSULE DAILY 10/19/12 02/17/13  Vernie Shanks, MD    Physical Exam: Vitals:   12/25/18 1830 12/25/18 2057 12/25/18 2130 12/25/18 2159  BP: 102/61  (!) 100/57 (!) 116/52  Pulse: 73  77  Resp: 20  20 (!) 21  Temp:    97.8 F (36.6 C)  TempSrc:    Oral  SpO2: 92%     Weight:  113.6 kg      Constitutional: Pale, calm, comfortable Vitals:   12/25/18 1830 12/25/18 2057 12/25/18 2130 12/25/18 2159  BP: 102/61  (!) 100/57 (!) 116/52  Pulse: 73   77  Resp: 20  20 (!) 21  Temp:    97.8 F (36.6 C)  TempSrc:    Oral  SpO2: 92%     Weight:  113.6 kg     Eyes: PERRL, lids and conjunctivae normal ENMT: Mucous membranes are moist. Posterior pharynx clear of any exudate or lesions. Neck: normal, supple, no masses, no thyromegaly Respiratory: clear to auscultation bilaterally, no wheezing, no crackles. Normal respiratory effort. No accessory muscle use.  Cardiovascular: Irregular rate and rhythm, no murmurs / rubs / gallops. No extremity edema. 2+ pedal pulses. No carotid bruits.  Abdomen: no tenderness, no masses palpated. No hepatosplenomegaly. Bowel sounds positive.  Musculoskeletal: no clubbing / cyanosis. No joint deformity upper and lower extremities. Good ROM, no contractures. Normal muscle tone.  Skin: no rashes, lesions, ulcers. No induration Neurologic: CN 2-12 grossly intact. Strength 5/5 in all 4.  Psychiatric: Normal judgment and insight. Alert and oriented x 3. Normal mood.   Labs on Admission: I have personally reviewed following labs and imaging studies  CBC: Recent Labs  Lab 12/25/18 1902  WBC 7.6  HGB 5.1*  HCT 19.5*  MCV 84.8  PLT 469    Basic Metabolic Panel: Recent Labs  Lab 12/25/18 1902  NA 137  K 4.7  CL 109  CO2 19*  GLUCOSE 143*  BUN 78*  CREATININE 2.31*  CALCIUM 8.4*   GFR: Estimated Creatinine Clearance: 36 mL/min (A) (by C-G formula based on SCr of 2.31 mg/dL (H)). Liver Function Tests: Recent Labs  Lab 12/25/18 1902  AST 12*  ALT 10  ALKPHOS 54  BILITOT 1.0  PROT 6.4*  ALBUMIN 3.1*   Coagulation Profile: Recent Labs  Lab 12/25/18 1902  INR >10.0*   Urine analysis:    Component Value Date/Time   COLORURINE YELLOW 12/25/2018 1820   APPEARANCEUR CLEAR 12/25/2018 1820   LABSPEC 1.013 12/25/2018 1820   PHURINE 5.0 12/25/2018 1820   GLUCOSEU NEGATIVE 12/25/2018 1820   HGBUR SMALL (A) 12/25/2018 1820   BILIRUBINUR NEGATIVE 12/25/2018 1820   KETONESUR NEGATIVE 12/25/2018 1820   PROTEINUR NEGATIVE 12/25/2018 1820   UROBILINOGEN 0.2 05/16/2014 0822   NITRITE POSITIVE (A) 12/25/2018 1820   LEUKOCYTESUR MODERATE (A) 12/25/2018 1820    Radiological Exams on Admission: XR Chest Single View  Result Date: 12/25/2018 CLINICAL DATA:  Generalized weakness and dizziness for 2 days. EXAM: PORTABLE CHEST 1 VIEW COMPARISON:  Single-view of the chest 05/17/2014. FINDINGS: Hazy opacity over the left chest is consistent with a layering pleural effusion and airspace disease in the mid and lower lung zones. Pleural thickening or small effusion on the right is noted. There is cardiomegaly and pulmonary vascular congestion. Atherosclerosis is noted. No pneumothorax. No acute bony abnormality. IMPRESSION: Findings compatible with a layering left pleural effusion and airspace disease which could be atelectasis or pneumonia. Cardiomegaly and pulmonary vascular congestion. Atherosclerosis. Electronically Signed   By: Inge Rise M.D.   On: 12/25/2018 19:01    EKG: Pending.  Assessment/Plan Active Problems:   Acute anemia   Acute symptomatic anemia-fatigue and dizziness, hemoglobin 5.1, Baseline 14.   Reports  history of colon cancer requiring bowel resection about ~ 20 years ago.  Colonoscopy on 04/06/2012, Dr. Deatra Ina, 1 sessile polyp multiple fragments of tubular adenoma no high-grade dysplasia or malignancy.  -Transfuse 2 units ordered in ED. -Consider rd unit of transfusion tomorrow -Stool FOBT -Monitor hemoglobin closely -GI consult in a.m. -n.p.o. midnight -IV Protonix  Community-acquired pneumonia -cough, O2 sats 85% improved with home 2 L O2.  Rules out for sepsis.  With normal lactic acid 1.8.  Chest x-ray there left pleural effusion and airspace disease which could be atelectasis or pneumonia, cardiomegaly and pulmonary vascular congestion.    -Continue IV ceftriaxone and azithromycin -CBC, BMP a.m.  Acute kidney injury-creatinine 2.3, baseline 1.4 - 1.9.  Reports poor p.o. intake.  With hypotension, not on diuretics.  Likely 2/2 Lasix and losartan, with poor p.o. intake, and anemia. No orthopnea, no peripheral edema but chest x-ray suggesting pulmonary edema.  Up until 2 days ago, he reports compliance with home Lasix 40 to 80 mg BID. -For now transfuse 2 units. -Hold home Lasix with initial hypotension in the setting of GI blood loss -Will hold off on IV fluids for now -BMP a.m., may improve off nephrotoxic's.  Probable UTI- UA with positive nitrites and leukocytes.  Denies urinary symptoms, Rules out for sepsis.  WBC 7.6. -Obtain urine cultures -Continue IV ceftriaxone  History of nonischemic cardiomyopathy-BNP today 291, appears to be borderline for him.  Elevations up to 320 in the past.  Patient reports weight loss, per charts weight stable.  No sign of peripheral edema.  Reports stable orthopnea, no dyspnea.  On chronic 2 L O2 at night, sats down to 85% on room air in the ED.  Chest x-ray shows cardiomegaly with pulmonary vascular congestion, with layering pleural effusion.  Echo -2016, EF 65% to 70%. Wall motion was normal; there were no regional wall motion abnormalities.  The study is not technically sufficient to allow evaluation of LV diastolic function. -Hold off on Lasix for now -BMP a.m.  Supratherapeutic INR- > 10, on warfarin for atrial fibrillation stroke prophylaxis.  Kcentra and IV vitamin K 10 mg given in ED. -Hold home warfarin -Follow-up daily PT/INR -SCDs  Morbid obesity, obstructive sleep apnea-not on CPAP.  Diabetes mellitus-random glucose 143 - SSI -Hold home Trulicity, Lisabeth Pick Metformin while n.p.o.  Atrial fibrillation- rate controlled and anticoagulated.  Blood pressure still soft. -pls resume home Cardizem and metoprolol in a.m, pending stable blood pressure  Hypertension-initial hypotension, blood pressure of still soft. -Hold home Cardizem, metoprolol, losartan and Lasix.  DVT prophylaxis: SCDs, supratherapeutic INR Code Status: Full code Family Communication: Plan of care discussed with patient, verbalized understanding.  No family at bedside. Disposition Plan: > 2 days Consults called: Gastroenterology  Admission status: Inpatient, stepdown I certify that at the point of admission it is my clinical judgment that the patient will require inpatient hospital care spanning beyond 2 midnights from the point of admission due to high intensity of service, high risk for further deterioration and high frequency of surveillance required. The following factors support the patient status of inpatient: Multiple organ dysfunction requiring close monitoring.   Bethena Roys MD [facility]  12/25/2018, 11:22 PM

## 2018-12-25 NOTE — ED Triage Notes (Signed)
Pt placed on 2L [place] d/t O2 sats on RA 85%, pt uses 2L [place] at home PRN per pt

## 2018-12-25 NOTE — ED Notes (Signed)
Date and time results received: 12/25/18 2015 (use smartphrase ".now" to insert current time)  Test: INR  Critical Value: >10.0  Name of Provider Notified: Bero  Orders Received? Or Actions Taken?:

## 2018-12-25 NOTE — ED Notes (Signed)
Contact # Daughter Juanda Bond [phone]

## 2018-12-25 NOTE — ED Provider Notes (Signed)
[facility] Emergency Department Provider Note MRN:  [id number]  Arrival date & time: 12/25/18     Chief Complaint   Fatigue   History of Present Illness   William Flores is a 68 y.o. year-old male with a history of CAD, CHF, CKD, colon cancer, diabetes, hypertension, obesity hypoventilation syndrome presenting to the ED with chief complaint of fatigue.  Gradual worsening fatigue for the past 1 to 2 weeks per patient.  Worse for the past 2 days.  Endorsing cough.  Denies shortness of breath or chest pain.  Denies abdominal pain.  Endorses vomiting when trying to drink water.  Denies diarrhea.  Symptoms constant, moderate, no exacerbating or alleviating factors.  Review of Systems  A complete 10 system review of systems was obtained and all systems are negative except as noted in the HPI and PMH.   Patient's Health History    Past Medical History:  Diagnosis Date  . CAD (coronary artery disease)    LHC 2/08:  mRCA 50%, o/w no sig CAD, EF 25%  . Cardiomyopathy ([place])    EF 35-40% in 2/16 in setting of AF with RVR >> echo 5/16 with improved LVF with EF 65-70%  . Chronic atrial fibrillation ([place])   . Chronic diastolic heart failure (HCC)    HFPF - EF ~65-70% (OFTEN EXACERBATED BY AFIB)  . CKD (chronic kidney disease)    hx of worsening renal failure and hyperK+ in setting of acute diast CHF >> required CVVHD in 4/16 and dialysis x 1 in 5/16  . CKD stage 3 due to type 2 diabetes mellitus ([place]) 09/17/2015  . Colon cancer ([place])   . Colon polyps 09/21/2012   Tubular adenoma  . Diabetes ([place])   . History of echocardiogram    Echo 05/16/14:  mild LVH, vigorous LVF, EF 65-70%, no RWMA, mean AV gradient 9 mmHg, MAC, mild MR, mod LAE, mild RVE, mild reduced RVF, severe RAE  . Hyperlipidemia   . Hypertension   . Obesity hypoventilation syndrome ([place])   . Renal insufficiency   . Sleep apnea     Past Surgical History:  Procedure Laterality Date  . CIRCUMCISION     . COLON RESECTION    . COLONOSCOPY N/A 09/21/2012   Procedure: COLONOSCOPY;  Surgeon: Inda Castle, MD;  Location: [facility] ENDOSCOPY;  Service: Endoscopy;  Laterality: N/A;  . US ECHOCARDIOGRAPHY  03/04/2010   abnormal study    Family History  Problem Relation Age of Onset  . Breast cancer Mother   . Diabetes Mother   . Heart attack Father     Social History   Socioeconomic History  . Marital status: Divorced    Spouse name: Not on file  . Number of children: 1  . Years of education: Not on file  . Highest education level: Not on file  Occupational History    Employer: [employer]  Tobacco Use  . Smoking status: Former Smoker    Quit date: 08/06/1983    Years since quitting: 35.4  . Smokeless tobacco: Never Used  Substance and Sexual Activity  . Alcohol use: No  . Drug use: No  . Sexual activity: Not on file  Other Topics Concern  . Not on file  Social History Narrative  . Not on file   Social Determinants of Health   Financial Resource Strain:   . Difficulty of Paying Living Expenses: Not on file  Food Insecurity:   . Worried About Charity fundraiser  in the Last Year: Not on file  . Ran Out of Food in the Last Year: Not on file  Transportation Needs:   . Lack of Transportation (Medical): Not on file  . Lack of Transportation (Non-Medical): Not on file  Physical Activity:   . Days of Exercise per Week: Not on file  . Minutes of Exercise per Session: Not on file  Stress:   . Feeling of Stress : Not on file  Social Connections:   . Frequency of Communication with Friends and Family: Not on file  . Frequency of Social Gatherings with Friends and Family: Not on file  . Attends Religious Services: Not on file  . Active Member of Clubs or Organizations: Not on file  . Attends Archivist Meetings: Not on file  . Marital Status: Not on file  Intimate Partner Violence:   . Fear of Current or Ex-Partner: Not on file  . Emotionally Abused: Not on file  .  Physically Abused: Not on file  . Sexually Abused: Not on file     Physical Exam  Vital Signs and Nursing Notes reviewed Vitals:   12/25/18 1813 12/25/18 1830  BP: (!) 97/58 102/61  Pulse: 72 73  Resp: 17 20  Temp: 97.9 F (36.6 C)   SpO2: 95% 92%    CONSTITUTIONAL: Chronically ill-appearing, NAD, pale NEURO:  Alert and oriented x 3, no focal deficits EYES:  eyes equal and reactive ENT/NECK:  no LAD, no JVD CARDIO: Regular rate, well-perfused, normal S1 and S2 PULM: Decreased breath sounds bilateral bases GI/GU:  normal bowel sounds, non-distended, non-tender MSK/SPINE:  No gross deformities, no edema SKIN:  no rash, atraumatic PSYCH:  Appropriate speech and behavior  Diagnostic and Interventional Summary    EKG Interpretation  Date/Time:    Ventricular Rate:    PR Interval:    QRS Duration:   QT Interval:    QTC Calculation:   R Axis:     Text Interpretation:        Labs Reviewed  CBC - Abnormal; Notable for the following components:      Result Value   RBC 2.30 (*)    Hemoglobin 5.1 (*)    HCT 19.5 (*)    MCH 22.2 (*)    MCHC 26.2 (*)    RDW 22.5 (*)    nRBC 0.5 (*)    All other components within normal limits  COMPREHENSIVE METABOLIC PANEL - Abnormal; Notable for the following components:   CO2 19 (*)    Glucose, Bld 143 (*)    BUN 78 (*)    Creatinine, Ser 2.31 (*)    Calcium 8.4 (*)    Total Protein 6.4 (*)    Albumin 3.1 (*)    AST 12 (*)    GFR calc non Af Amer 28 (*)    GFR calc Af Amer 33 (*)    All other components within normal limits  URINALYSIS, ROUTINE W REFLEX MICROSCOPIC - Abnormal; Notable for the following components:   Hgb urine dipstick SMALL (*)    Nitrite POSITIVE (*)    Leukocytes,Ua MODERATE (*)    All other components within normal limits  PROTIME-INR - Abnormal; Notable for the following components:   Prothrombin Time >90.0 (*)    INR >10.0 (*)    All other components within normal limits  BRAIN NATRIURETIC PEPTIDE -  Abnormal; Notable for the following components:   B Natriuretic Peptide 291.0 (*)    All other components within  normal limits  CULTURE, BLOOD (SINGLE)  LACTIC ACID, PLASMA  POC SARS CORONAVIRUS 2 AG -  ED  PREPARE RBC (CROSSMATCH)  TYPE AND SCREEN  TROPONIN I (HIGH SENSITIVITY)  TROPONIN I (HIGH SENSITIVITY)    XR Chest Single View  Final Result      Medications  azithromycin (ZITHROMAX) 500 mg in sodium chloride 0.9 % 250 mL IVPB (500 mg Intravenous New Bag/Given 12/25/18 2021)  0.9 %  sodium chloride infusion (has no administration in time range)  prothrombin complex conc human (KCENTRA) IVPB 2,000 Units (has no administration in time range)  phytonadione (VITAMIN K) 10 mg in dextrose 5 % 50 mL IVPB (has no administration in time range)  cefTRIAXone (ROCEPHIN) 2 g in sodium chloride 0.9 % 100 mL IVPB (0 g Intravenous Stopped 12/25/18 2010)     Procedures  /  Critical Care .Critical Care Performed by: Maudie Flakes, MD Authorized by: Maudie Flakes, MD   Critical care provider statement:    Critical care time (minutes):  39   Critical care was necessary to treat or prevent imminent or life-threatening deterioration of the following conditions:  Circulatory failure (Severe GI bleed in the setting of supratherapeutic INR)   Critical care was time spent personally by me on the following activities:  Discussions with consultants, evaluation of patient's response to treatment, examination of patient, ordering and performing treatments and interventions, ordering and review of laboratory studies, ordering and review of radiographic studies, pulse oximetry, re-evaluation of patient's condition, obtaining history from patient or surrogate and review of old charts    ED Course and Medical Decision Making  I have reviewed the triage vital signs and the nursing notes.  Pertinent labs & imaging results that were available during my care of the patient were reviewed by me and considered  in my medical decision making (see below for details).     Considering metabolic disarray, UTI, CHF, given patient's soft blood pressure there is also concern for early sepsis, also considering cardiogenic shock as patient has a history of such.  Monitoring closely, work-up pending.  8:26 PM update: Labs reveal hemoglobin of 5, INR of greater than 10.  Rectal exam reveals frank melena, positive for blood on Hemoccult.  Will transfuse 2 units and reverse with Kcentra and vitamin K.  Will admit to stepdown.  Barth Kirks. [place]onia Small, [place] [email or web address]  Final Clinical Impressions(s) / ED Diagnoses     ICD-10-CM   1. Gastrointestinal hemorrhage, unspecified gastrointestinal hemorrhage type  K92.2   2. Cough  R05 XR Chest Single View    XR Chest Single View  3. Symptomatic anemia  D64.9   4. Supratherapeutic INR  R79.1     ED Discharge Orders    None       Discharge Instructions Discussed with and Provided to Patient:   Discharge Instructions   None       Maudie Flakes, MD 12/25/18 2031

## 2018-12-25 NOTE — ED Triage Notes (Signed)
Pt in from home via [facility], pt reports generalized weakness and dizziness x 2 days, pt reported to be hypotensive initially 95 sys with increase after 250 mL NS bolus pta, pt A&O x4, reports cough

## 2018-12-25 NOTE — ED Notes (Signed)
Date and time results received: 12/25/18 2015 (use smartphrase ".now" to insert current time)  Test: Hgb Critical Value: 5.1  Name of Provider Notified: Bero  Orders Received? Or Actions Taken?:

## 2018-12-26 ENCOUNTER — Inpatient Hospital Stay (HOSPITAL_COMMUNITY): Payer: Medicare Other

## 2018-12-26 ENCOUNTER — Other Ambulatory Visit: Payer: Self-pay

## 2018-12-26 DIAGNOSIS — Z85038 Personal history of other malignant neoplasm of large intestine: Secondary | ICD-10-CM

## 2018-12-26 DIAGNOSIS — K922 Gastrointestinal hemorrhage, unspecified: Secondary | ICD-10-CM

## 2018-12-26 DIAGNOSIS — I10 Essential (primary) hypertension: Secondary | ICD-10-CM

## 2018-12-26 DIAGNOSIS — D509 Iron deficiency anemia, unspecified: Secondary | ICD-10-CM

## 2018-12-26 DIAGNOSIS — R791 Abnormal coagulation profile: Secondary | ICD-10-CM

## 2018-12-26 LAB — IRON AND TIBC
Iron: 17 ug/dL — ABNORMAL LOW (ref 45–182)
Saturation Ratios: 4 % — ABNORMAL LOW (ref 17.9–39.5)
TIBC: 380 ug/dL (ref 250–450)
UIBC: 363 ug/dL

## 2018-12-26 LAB — CBC
HCT: 23.2 % — ABNORMAL LOW (ref 39.0–52.0)
HCT: 27.6 % — ABNORMAL LOW (ref 39.0–52.0)
Hemoglobin: 6.5 g/dL — CL (ref 13.0–17.0)
Hemoglobin: 7.9 g/dL — ABNORMAL LOW (ref 13.0–17.0)
MCH: 24.4 pg — ABNORMAL LOW (ref 26.0–34.0)
MCH: 25 pg — ABNORMAL LOW (ref 26.0–34.0)
MCHC: 28 g/dL — ABNORMAL LOW (ref 30.0–36.0)
MCHC: 28.6 g/dL — ABNORMAL LOW (ref 30.0–36.0)
MCV: 87.2 fL (ref 80.0–100.0)
MCV: 87.3 fL (ref 80.0–100.0)
Platelets: 165 10*3/uL (ref 150–400)
Platelets: 170 10*3/uL (ref 150–400)
RBC: 2.66 MIL/uL — ABNORMAL LOW (ref 4.22–5.81)
RBC: 3.16 MIL/uL — ABNORMAL LOW (ref 4.22–5.81)
RDW: 21 % — ABNORMAL HIGH (ref 11.5–15.5)
RDW: 20.4 % — ABNORMAL HIGH (ref 11.5–15.5)
WBC: 7 10*3/uL (ref 4.0–10.5)
WBC: 7.9 10*3/uL (ref 4.0–10.5)
nRBC: 0.7 % — ABNORMAL HIGH (ref 0.0–0.2)
nRBC: 0.6 % — ABNORMAL HIGH (ref 0.0–0.2)

## 2018-12-26 LAB — PROTIME-INR
INR: 1.9 — ABNORMAL HIGH (ref 0.8–1.2)
Prothrombin Time: 21.8 seconds — ABNORMAL HIGH (ref 11.4–15.2)

## 2018-12-26 LAB — BASIC METABOLIC PANEL
Anion gap: 11 (ref 5–15)
BUN: 77 mg/dL — ABNORMAL HIGH (ref 8–23)
CO2: 19 mmol/L — ABNORMAL LOW (ref 22–32)
Calcium: 8.4 mg/dL — ABNORMAL LOW (ref 8.9–10.3)
Chloride: 109 mmol/L (ref 98–111)
Creatinine, Ser: 2.2 mg/dL — ABNORMAL HIGH (ref 0.61–1.24)
GFR calc Af Amer: 35 mL/min — ABNORMAL LOW (ref >60)
GFR calc non Af Amer: 30 mL/min — ABNORMAL LOW (ref >60)
Glucose, Bld: 114 mg/dL — ABNORMAL HIGH (ref 70–99)
Potassium: 4.3 mmol/L (ref 3.5–5.1)
Sodium: 139 mmol/L (ref 135–145)

## 2018-12-26 LAB — PREPARE RBC (CROSSMATCH)

## 2018-12-26 LAB — GLUCOSE, CAPILLARY
Glucose-Capillary: 112 mg/dL — ABNORMAL HIGH (ref 70–99)
Glucose-Capillary: 250 mg/dL — ABNORMAL HIGH (ref 70–99)

## 2018-12-26 LAB — FERRITIN: Ferritin: 35 ng/mL (ref 24–336)

## 2018-12-26 LAB — VITAMIN B12: Vitamin B-12: 392 pg/mL (ref 180–914)

## 2018-12-26 LAB — HIV ANTIBODY (ROUTINE TESTING W REFLEX): HIV Screen 4th Generation wRfx: NONREACTIVE

## 2018-12-26 LAB — CBG MONITORING, ED
Glucose-Capillary: 110 mg/dL — ABNORMAL HIGH (ref 70–99)
Glucose-Capillary: 89 mg/dL (ref 70–99)
Glucose-Capillary: 113 mg/dL — ABNORMAL HIGH (ref 70–99)

## 2018-12-26 LAB — HEMOGLOBIN A1C
Hgb A1c MFr Bld: 4.9 % (ref 4.8–5.6)
Mean Plasma Glucose: 93.93 mg/dL

## 2018-12-26 LAB — SARS CORONAVIRUS 2 (TAT 6-24 HRS): SARS Coronavirus 2: NEGATIVE

## 2018-12-26 LAB — FOLATE: Folate: 6 ng/mL (ref >5.9)

## 2018-12-26 MED ORDER — ORAL CARE MOUTH RINSE
15.0000 mL | Freq: Two times a day (BID) | OROMUCOSAL | Status: DC
  Administered 2018-12-26 – 2019-01-01 (×12): 15 mL via OROMUCOSAL

## 2018-12-26 MED ORDER — VITAMIN K1 10 MG/ML IJ SOLN
10.0000 mg | Freq: Once | INTRAVENOUS | Status: AC
  Administered 2018-12-26: 10 mg via INTRAVENOUS
  Filled 2018-12-26: qty 1

## 2018-12-26 MED ORDER — METOPROLOL TARTRATE 50 MG PO TABS
50.0000 mg | ORAL_TABLET | Freq: Two times a day (BID) | ORAL | Status: DC
  Administered 2018-12-26 – 2019-01-01 (×12): 50 mg via ORAL
  Filled 2018-12-26 (×12): qty 1

## 2018-12-26 MED ORDER — CHLORHEXIDINE GLUCONATE CLOTH 2 % EX PADS
6.0000 | MEDICATED_PAD | Freq: Every day | CUTANEOUS | Status: DC
  Administered 2018-12-26 – 2019-01-01 (×7): 6 via TOPICAL

## 2018-12-26 NOTE — Consult Note (Signed)
Referring Provider: Kathie Dike, MD Primary Care Physician:  [name], [name], MD Primary Gastroenterologist:  Dr. Laural Golden  Reason for Consultation:    Profound anemia  HPI:   Patient is 68 year old Caucasian male who was brought to emergency room by EMS yesterday evening.  And route he was noted to be hypotensive and given a bolus of IV fluids. Evaluation in emergency room revealed hemoglobin of 5.1 g and hematocrit of 19.5.  Patient's MCV was 84.  H.  Chemistry panel was pertinent for serum albumin of 3.1.  PT was greater than 19 and INR was greater than 10.  Patient was given prothrombin complex as well as vitamin K.  He also has received 3 units of PRBCs.  There was concern for intra-abdominal bleed.  Therefore abdominal pelvic CT was obtained.  Study was negative for retroperitoneal or intraperitoneal hemorrhage.  He had trace left pleural effusion along with patchy opacity involving lingula concerning for infection and or atelectasis.  There was evidence of prior right hemicolectomy with patent ileocolonic anastomosis and he also had mild bilateral perinephric stranding.  He also had umbilical and inguinal hernias aortic atherosclerosis and enlarged prostate.  Patient is waiting for bed placement on the third floor.  Patient states he has not felt well in the last 3 to 4 weeks.  He has been gradually getting weaker.  He lost his appetite.  He states he has not eaten any food in the last 4 days.  At times he would vomit after he would drink water.  He has not had hematemesis melena or rectal bleeding.  He is on low-dose aspirin and warfarin.  He states he does not take other OTC NSAIDs.  He is aware not to take such medications and has been using Tylenol on as-needed basis.  He generally has 1-2 bowel movements per day.  He states his stools have been dark but not black or red.  Patient states he has lost 50 pounds.  He feels he has lost his weight in the last 2 months.  He weighed 293 pounds  when he saw Dr. Warrick Parisian on 09/03/2018 and his weight in emergency room today was 249 pounds.  He also complains of daily heartburn.  He has noted abdominal distention.  He also has noted cough productive of scant amount of sputum over the last few days.  He says he developed progressive lightheadedness over last several days.  He also has been experiencing pain in the back of his head.  No history of fall.  He had virtual visit with Ms. Evelina Dun FNP on 12/20/2018 and he was supposed to have blood work but he was not able to do so. Patient has history of colon carcinoma.  His last colonoscopy was in September 2014 by Dr. Erskine Emery with removal of single tubular adenoma.  He was advised to return for follow-up visit in 5 years but he did not.   Patient lives in [place].  He lives alone.  He is divorced.  He has 1 son in good health.  He states his stepdaughter helps him any needed.  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  He used to smoke a cigarette here and there but not every day and he would drink alcohol socially but not daily.  He has not had either in 20 years.  He worked in [employer] and did for work for 17 years and he also worked  at [employer] for 27 years.  He is now retired.   Both parents died of MI in their 64s.  They died 2 months apart.  Patient does not have any siblings.   Past Medical History:  Diagnosis Date  . CAD (coronary artery disease)    LHC 2/08:  mRCA 50%, o/w no sig CAD, EF 25%  . Cardiomyopathy ([place])    EF 35-40% in 2/16 in setting of AF with RVR >> echo 5/16 with improved LVF with EF 65-70%  . Chronic atrial fibrillation ([place])   . Chronic diastolic heart failure (H[facility])    HFPF - EF ~65-70% (OFTEN EXACERBATED BY AFIB)  . CKD (chronic kidney disease)    hx of worsening renal failure and hyperK+ in setting of acute diast CHF >> required CVVHD in 4/16 and dialysis x 1 in 5/16  . CKD stage 3 due  to type 2 diabetes mellitus ([place]) 09/17/2015  . Colon cancer ([place])   . Colon polyps 09/21/2012   Tubular adenoma  . Diabetes ([place])   . History of echocardiogram    Echo 05/16/14:  mild LVH, vigorous LVF, EF 65-70%, no RWMA, mean AV gradient 9 mmHg, MAC, mild MR, mod LAE, mild RVE, mild reduced RVF, severe RAE  . Hyperlipidemia   . Hypertension   . Obesity hypoventilation syndrome ([place])   . Renal insufficiency   . Sleep apnea     Past Surgical History:  Procedure Laterality Date  . CIRCUMCISION    . COLON RESECTION    . COLONOSCOPY N/A 09/21/2012   Procedure: COLONOSCOPY;  Surgeon: Inda Castle, MD;  Location: [facility];  Service: Endoscopy;  Laterality: N/A;  . US ECHOCARDIOGRAPHY  03/04/2010   abnormal study    Prior to Admission medications   Medication Sig Start Date End Date Taking? Authorizing Provider  allopurinol (ZYLOPRIM) 300 MG tablet TAKE 1 TABLET BY MOUTH EVERY DAY Patient taking differently: Take 300 mg by mouth daily.  10/26/18  Yes [name], [name], MD  atorvastatin (LIPITOR) 20 MG tablet 1 tablet daily Patient taking differently: Take 20 mg by mouth daily.  09/03/18  Yes [name], [name], MD  azelastine (OPTIVAR) 0.05 % ophthalmic solution PLACE 2 DROPS INTO BOTH EYES 2 (TWO) TIMES DAILY. Patient taking differently: Place 2 drops into the right eye 2 (two) times daily.  07/30/18  Yes [name], [name], MD  dapagliflozin propanediol (FARXIGA) 5 MG TABS tablet Take 5 mg by mouth daily. 09/03/18  Yes [name], [name], MD  diltiazem (CARDIZEM CD) 180 MG 24 hr capsule TAKE 1 CAPSULE BY MOUTH EVERY DAY Patient taking differently: Take 180 mg by mouth daily.  10/26/18  Yes [name], [name], MD  furosemide (LASIX) 80 MG tablet TAKE 1 TABLET BY MOUTH IN MORNING AND 1/2 TABLET IN THE EVENING Patient taking differently: Take 40-80 mg by mouth See admin instructions. TAKE 1 TABLET BY MOUTH IN MORNING AND 1/2 TABLET IN THE EVENING 12/19/18  Yes [name], [name],  MD  losartan (COZAAR) 100 MG tablet Take 100 mg by mouth daily.   Yes [provider]  metFORMIN (GLUCOPHAGE) 1000 MG tablet TAKE 0.5 TABLETS (500 MG TOTAL) BY MOUTH 2 (TWO) TIMES DAILY WITH A MEAL. 12/11/18  Yes [name], [name], MD  metoprolol tartrate (LOPRESSOR) 50 MG tablet Take 1 tablet (50 mg total) by mouth 2 (two) times daily. 09/03/18  Yes [name], [name], MD  omeprazole (PRILOSEC) 20 MG capsule Take 1 capsule (20 mg total) by  mouth daily. 02/26/18  Yes [name], [name], MD  potassium chloride SA (KLOR-CON M20) 20 MEQ tablet TAKE 1/2 TABLET (10 MEQ TOTAL) BY MOUTH DAILY. NEED OV. *INS WILL COVER 4/19 OR LATER* Patient taking differently: Take 10 mEq by mouth daily.  12/25/18  Yes [name], [name], MD  TRESIBA FLEXTOUCH 100 UNIT/ML SOPN FlexTouch Pen INJECT 0.26 MLS (26 UNITS TOTAL) INTO THE SKIN DAILY. Patient taking differently: 26 Units daily.  10/29/18  Yes [name], [name], MD  TRULICITY 1.5 DX/8.3JA SOPN INJECT 1.5 MG INTO THE SKIN ONCE A WEEK. 12/11/18  Yes [name], [name], MD  warfarin (COUMADIN) 5 MG tablet TAKE AS DIRECTED Patient taking differently: Take 5 mg by mouth See admin instructions. 1 tablet on Tuesday and Thursday and 1 and 1/2 tablet on Monday, Wednesday Friday, Saturday and Sunday 12/11/18  Yes [name], [name], MD  colchicine 0.6 MG tablet Take 2 tablets st first sign of gout flare. May take an additional tablet in 1 hour if needed.  Max of 3 tablets in 24 hours. Patient not taking: Reported on 12/26/2018 03/09/16   Timmothy Euler, MD  Insulin Pen Needle 32G X 4 MM MISC Use to give insulin daily Dx E11.29 Patient not taking: Reported on 12/25/2018 09/04/18   [name], [name], MD  polyvinyl alcohol (LIQUIFILM TEARS) 1.4 % ophthalmic solution Place 1 drop into both eyes as needed for dry eyes. Patient not taking: Reported on 12/26/2018 04/23/14   Mendel Corning, MD  diltiazem [facility]) 240 MG 24 hr capsule TAKE 1 CAPSULE DAILY 10/19/12  02/17/13  Vernie Shanks, MD    Current Facility-Administered Medications  Medication Dose Route Frequency Provider Last Rate Last Admin  . acetaminophen (TYLENOL) tablet 650 mg  650 mg Oral Q6H PRN Emokpae, Ejiroghene E, MD       Or  . acetaminophen (TYLENOL) suppository 650 mg  650 mg Rectal Q6H PRN Emokpae, Ejiroghene E, MD      . allopurinol (ZYLOPRIM) tablet 300 mg  300 mg Oral Daily Emokpae, Ejiroghene E, MD   300 mg at 12/26/18 0947  . atorvastatin (LIPITOR) tablet 20 mg  20 mg Oral q1800 Emokpae, Ejiroghene E, MD      . azithromycin (ZITHROMAX) 500 mg in sodium chloride 0.9 % 250 mL IVPB  500 mg Intravenous Q24H Emokpae, Ejiroghene E, MD   Stopped at 12/25/18 2121  . cefTRIAXone (ROCEPHIN) 2 g in sodium chloride 0.9 % 100 mL IVPB  2 g Intravenous Q24H Emokpae, Ejiroghene E, MD      . Chlorhexidine Gluconate Cloth 2 % PADS 6 each  6 each Topical Daily Memon, Jehanzeb, MD      . insulin aspart (novoLOG) injection 0-15 Units  0-15 Units Subcutaneous Q4H Emokpae, Ejiroghene E, MD   2 Units at 12/25/18 2348  . MEDLINE mouth rinse  15 mL Mouth Rinse BID Kathie Dike, MD      . ondansetron (ZOFRAN) tablet 4 mg  4 mg Oral Q6H PRN Emokpae, Ejiroghene E, MD       Or  . ondansetron (ZOFRAN) injection 4 mg  4 mg Intravenous Q6H PRN Emokpae, Ejiroghene E, MD      . pantoprazole (PROTONIX) injection 40 mg  40 mg Intravenous QHS Emokpae, Ejiroghene E, MD   40 mg at 12/25/18 2349  . polyethylene glycol (MIRALAX / GLYCOLAX) packet 17 g  17 g Oral Daily PRN Emokpae, Ejiroghene E, MD       Current Outpatient Medications  Medication  Sig Dispense Refill  . allopurinol (ZYLOPRIM) 300 MG tablet TAKE 1 TABLET BY MOUTH EVERY DAY (Patient taking differently: Take 300 mg by mouth daily. ) 90 tablet 1  . atorvastatin (LIPITOR) 20 MG tablet 1 tablet daily (Patient taking differently: Take 20 mg by mouth daily. ) 90 tablet 3  . azelastine (OPTIVAR) 0.05 % ophthalmic solution PLACE 2 DROPS INTO BOTH EYES 2  (TWO) TIMES DAILY. (Patient taking differently: Place 2 drops into the right eye 2 (two) times daily. ) 6 mL 5  . dapagliflozin propanediol (FARXIGA) 5 MG TABS tablet Take 5 mg by mouth daily. 90 tablet 3  . diltiazem (CARDIZEM CD) 180 MG 24 hr capsule TAKE 1 CAPSULE BY MOUTH EVERY DAY (Patient taking differently: Take 180 mg by mouth daily. ) 90 capsule 0  . furosemide (LASIX) 80 MG tablet TAKE 1 TABLET BY MOUTH IN MORNING AND 1/2 TABLET IN THE EVENING (Patient taking differently: Take 40-80 mg by mouth See admin instructions. TAKE 1 TABLET BY MOUTH IN MORNING AND 1/2 TABLET IN THE EVENING) 135 tablet 0  . losartan (COZAAR) 100 MG tablet Take 100 mg by mouth daily.    . metFORMIN (GLUCOPHAGE) 1000 MG tablet TAKE 0.5 TABLETS (500 MG TOTAL) BY MOUTH 2 (TWO) TIMES DAILY WITH A MEAL. 90 tablet 0  . metoprolol tartrate (LOPRESSOR) 50 MG tablet Take 1 tablet (50 mg total) by mouth 2 (two) times daily. 180 tablet 3  . omeprazole (PRILOSEC) 20 MG capsule Take 1 capsule (20 mg total) by mouth daily. 90 capsule 3  . potassium chloride SA (KLOR-CON M20) 20 MEQ tablet TAKE 1/2 TABLET (10 MEQ TOTAL) BY MOUTH DAILY. NEED OV. *INS WILL COVER 4/19 OR LATER* (Patient taking differently: Take 10 mEq by mouth daily. ) 45 tablet 1  . TRESIBA FLEXTOUCH 100 UNIT/ML SOPN FlexTouch Pen INJECT 0.26 MLS (26 UNITS TOTAL) INTO THE SKIN DAILY. (Patient taking differently: 26 Units daily. ) 15 pen 1  . TRULICITY 1.5 DV/7.6HY SOPN INJECT 1.5 MG INTO THE SKIN ONCE A WEEK. 6 mL 0  . warfarin (COUMADIN) 5 MG tablet TAKE AS DIRECTED (Patient taking differently: Take 5 mg by mouth See admin instructions. 1 tablet on Tuesday and Thursday and 1 and 1/2 tablet on Monday, Wednesday Friday, Saturday and Sunday) 180 tablet 0  . colchicine 0.6 MG tablet Take 2 tablets st first sign of gout flare. May take an additional tablet in 1 hour if needed.  Max of 3 tablets in 24 hours. (Patient not taking: Reported on 12/26/2018) 10 tablet 1  . Insulin  Pen Needle 32G X 4 MM MISC Use to give insulin daily Dx E11.29 (Patient not taking: Reported on 12/25/2018) 100 each 3  . polyvinyl alcohol (LIQUIFILM TEARS) 1.4 % ophthalmic solution Place 1 drop into both eyes as needed for dry eyes. (Patient not taking: Reported on 12/26/2018) 15 mL 0    Allergies as of 12/25/2018 - Review Complete 12/20/2018  Allergen Reaction Noted  . Codeine  03/29/2012    Family History  Problem Relation Age of Onset  . Breast cancer Mother   . Diabetes Mother   . Heart attack Father     Social History   Socioeconomic History  . Marital status: Divorced    Spouse name: Not on file  . Number of children: 1  . Years of education: Not on file  . Highest education level: Not on file  Occupational History    Employer: [employer]  Tobacco Use  .  Smoking status: Former Smoker    Quit date: 08/06/1983    Years since quitting: 35.4  . Smokeless tobacco: Never Used  Substance and Sexual Activity  . Alcohol use: No  . Drug use: No  . Sexual activity: Not on file  Other Topics Concern  . Not on file  Social History Narrative  . Not on file   Social Determinants of Health   Financial Resource Strain:   . Difficulty of Paying Living Expenses: Not on file  Food Insecurity:   . Worried About Charity fundraiser in the Last Year: Not on file  . Ran Out of Food in the Last Year: Not on file  Transportation Needs:   . Lack of Transportation (Medical): Not on file  . Lack of Transportation (Non-Medical): Not on file  Physical Activity:   . Days of Exercise per Week: Not on file  . Minutes of Exercise per Session: Not on file  Stress:   . Feeling of Stress : Not on file  Social Connections:   . Frequency of Communication with Friends and Family: Not on file  . Frequency of Social Gatherings with Friends and Family: Not on file  . Attends Religious Services: Not on file  . Active Member of Clubs or Organizations: Not on file  . Attends Theatre manager Meetings: Not on file  . Marital Status: Not on file  Intimate Partner Violence:   . Fear of Current or Ex-Partner: Not on file  . Emotionally Abused: Not on file  . Physically Abused: Not on file  . Sexually Abused: Not on file    Review of Systems: See HPI, otherwise normal ROS  Physical Exam: Temp:  [97.8 F (36.6 C)-99 F (37.2 C)] 99 F (37.2 C) (12/23 1210) Pulse Rate:  [72-134] 102 (12/23 1630) Resp:  [14-26] 20 (12/23 1630) BP: (96-141)/(51-119) 141/119 (12/23 1630) SpO2:  [91 %-100 %] 96 % (12/23 1630) Weight:  [113.6 kg] 113.6 kg (12/22 2057)   Patient was evaluated at bedside emergency room. Patient is alert and in no acute distress but he is intermittently coughing. He appears pale and so is his conjunctiva. Sclera is nonicteric. Oropharyngeal mucosa is normal. Neck without masses thyromegaly or lymphadenopathy. Cardiac exam with regular rhythm normal S1 and S2.  No murmur gallop noted. Auscultation lungs reveal vesicular breath sounds bilaterally. Abdomen is full.  There is long scar covering along the right side of umbilicus.  Small umbilical hernia which is completely reducible.  Bowel sounds are normal.  On palpation abdomen is soft and nontender without organomegaly or masses. No peripheral edema clubbing or koilonychia noted.  But his nails are very small as he bites his nails. He has pigmentation to skin of both legs secondary to prior edema stasis.   Lab Results: Recent Labs    12/25/18 1902 12/26/18 0355  WBC 7.6 7.0  HGB 5.1* 6.5*  HCT 19.5* 23.2*  PLT 184 165   BMET Recent Labs    12/25/18 1902 12/26/18 0355  NA 137 139  K 4.7 4.3  CL 109 109  CO2 19* 19*  GLUCOSE 143* 114*  BUN 78* 77*  CREATININE 2.31* 2.20*  CALCIUM 8.4* 8.4*   LFT Recent Labs    12/25/18 1902  PROT 6.4*  ALBUMIN 3.1*  AST 12*  ALT 10  ALKPHOS 54  BILITOT 1.0   PT/INR Recent Labs    12/25/18 1902 12/26/18 0355  LABPROT >90.0* 21.8*   INR >10.0* 1.9*  SARS Coronavirus 2 negative.  Studies/Results: CT ABDOMEN PELVIS WO CONTRAST  Result Date: 12/26/2018 CLINICAL DATA:  Acute anemia, elevated INR, exclude intra-abdominal bleeding EXAM: CT ABDOMEN AND PELVIS WITHOUT CONTRAST TECHNIQUE: Multidetector CT imaging of the abdomen and pelvis was performed following the standard protocol without IV contrast. COMPARISON:  Abdominal ultrasound September 03, 2018 CT abdomen pelvis 02/18/1999 (report only) FINDINGS: Lower chest: Patchy opacity seen in the lingula. Trace left pleural effusion with some adjacent atelectasis. No right effusion. Right lung bases clear. There is cardiomegaly with four-chamber enlargement. Dense mitral annular calcification is noted. Atherosclerotic calcification of the coronary arteries. Hepatobiliary: Distal esophagus, stomach and duodenal sweep are unremarkable. No small bowel wall thickening or dilatation. No evidence of obstruction. Postsurgical changes are present Pancreas: Unremarkable. No pancreatic ductal dilatation or surrounding inflammatory changes. Spleen: Normal in size without focal abnormality. Small accessory splenule Adrenals/Urinary Tract: Normal adrenal glands. Mild bilateral symmetric perinephric stranding, a nonspecific finding though may correlate with either age or decreased renal function. Kidneys are otherwise unremarkable, without renal calculi, suspicious lesion, or hydronephrosis. Bladder is unremarkable. Mild bladder wall thickening with indentation of the bladder base by the borderline enlarged prostate with median lobe hypertrophy. Stomach/Bowel: Distal esophagus, stomach and duodenal sweep are unremarkable. No small bowel wall thickening or dilatation. No evidence of obstruction. Question some postsurgical changes of the proximal colon suggestive of prior partial right hemicolectomy with ileocolic anastomosis. Remaining colonic segments are free of dilatation or wall thickening.  Vascular/Lymphatic: Extensive atherosclerotic calcification of the abdominal aorta and branch vessels. No visible aneurysm or ectasia. No suspicious or enlarged lymph nodes in the included lymphatic chains. Reproductive: Prostatomegaly with median lobe hypertrophy. Few coarse eccentric calcifications are noted. Seminal vesicles are unremarkable. Other: Diffuse mild body wall edema. No retroperitoneal or intraperitoneal hemorrhage is identified. Postsurgical changes of the low vertical midline abdomen are noted. Small fat containing umbilical hernia is seen. Small fat containing inguinal hernias. No bowel containing hernias. Musculoskeletal: There is diffuse fatty atrophy of the abdominal wall and pelvic musculature. Multilevel degenerative changes are present in the imaged portions of the spine. Multilevel flowing anterior osteophytosis, compatible with features of diffuse idiopathic skeletal hyperostosis (DISH). No acute osseous abnormality or suspicious osseous lesion. IMPRESSION: 1. No evidence of retroperitoneal or intraperitoneal hemorrhage. 2. Cardiomegaly with four-chamber enlargement. Dense mitral annular calcification. 3. Trace left pleural effusion with some adjacent atelectasis. 4. Patchy opacity in the lingula, may represent atelectasis or acute infection/inflammation. 5. Postsurgical changes in the abdomen appear to reflect a right hemicolectomy with patent ileocolic anastomosis. Correlate for surgical history. 6. Mild bilateral symmetric perinephric stranding, a nonspecific finding though may correlate with either age or decreased renal function. 7. Prostatomegaly with median lobe hypertrophy. Mild bladder wall thickening with indentation of the bladder base by the borderline enlarged prostate with median lobe hypertrophy. Correlate for symptoms of outlet obstructing consider urinalysis to exclude superimposed cystitis. 8. Small fat containing umbilical and inguinal hernias. No bowel containing  hernias. 9. Aortic Atherosclerosis (ICD10-I70.0). Electronically Signed   By: Lovena Le M.D.   On: 12/26/2018 03:03   XR Chest Single View  Result Date: 12/25/2018 CLINICAL DATA:  Generalized weakness and dizziness for 2 days. EXAM: PORTABLE CHEST 1 VIEW COMPARISON:  Single-view of the chest 05/17/2014. FINDINGS: Hazy opacity over the left chest is consistent with a layering pleural effusion and airspace disease in the mid and lower lung zones. Pleural thickening or small effusion on the right is noted. There is cardiomegaly and pulmonary vascular congestion.  Atherosclerosis is noted. No pneumothorax. No acute bony abnormality. IMPRESSION: Findings compatible with a layering left pleural effusion and airspace disease which could be atelectasis or pneumonia. Cardiomegaly and pulmonary vascular congestion. Atherosclerosis. Electronically Signed   By: Inge Rise M.D.   On: 12/25/2018 19:01    Assessment;  Patient is 68 year old Caucasian male with multiple medical problems including diabetes mellitus chronic atrial fibrillation on warfarin obesity, ischemic cardiomyopathy chronic kidney disease as well as history of colon carcinoma who presents with debilitating weakness postural symptoms and found to have supratherapeutic INR and hemoglobin of 5.1 g.  MCV is normal.  No history of melena or rectal bleeding.  Stool guaiac is pending.  Abdominal pelvic CT does not reveal any evidence of retroperitoneal or intraperitoneal bleed. It remains to be seen whether anemia is due to GI blood loss or multifactorial.    He has a history of colon carcinoma and is overdue for surveillance colonoscopy.  Patient has productive cough.  He has lingular infiltrate and currently on 2 antibiotics for possible pneumonia.  Covid testing is negative.  Therefore endoscopic evaluation will be delayed until patient deemed to be stable from respiratory stand point.  Documented weight loss of more than 40  pounds.  Recommendations;  Await for results of Hemoccults. Serum iron TIBC ferritin B12 and folate levels. Fasting cortisol level. Esophagogastroduodenoscopy and colonoscopy to be scheduled when patient deemed to be stable from medical standpoint. I would recommend the studies be performed during this hospitalization.  Dr. Gala Romney will be seeing patient on 12/27/2018 and I will be back on Christmas Day.   LOS: 1 day   Jeromy Borcherding  12/26/2018, 5:15 PM

## 2018-12-26 NOTE — ED Notes (Signed)
Tolerating blood product well.  No reaction noted.  VSS.

## 2018-12-26 NOTE — Progress Notes (Signed)
PROGRESS NOTE    William Flores  PPJ:[id number] DOB: 09/19/1950 DOA: 12/25/2018 PCP: [name], [name], MD    Brief Narrative:  68 year old male with a history of hypertension, diabetes, chronic kidney disease stage III, atrial fibrillation, presents with worsening generalized weakness.  Found to have significant anemia with a hemoglobin of 5.1.  Creatinine elevated at 2.3.  INR supratherapeutic at greater than 10 and was reversed with Kcentra.  Chest x-ray indicated possible pneumonia.  He was started on intravenous antibiotics and transfused PRBC.  Admitted for further work-up of anemia including GI evaluation.   Assessment & Plan:   Active Problems:   History of colon cancer   Morbid obesity (HCC)   OSA (obstructive sleep apnea)   Atrial fibrillation with slow ventricular response (HCC)   AKI (acute kidney injury) ([place])   DM (diabetes mellitus), type 2, uncontrolled, with renal complications ([place])   Essential hypertension   CKD stage 3 due to type 2 diabetes mellitus ([place])   Acute anemia   1. Acute blood loss anemia likely related to GI bleeding.  Patient reports noticing dark-colored stools intermittently.  He does have a history of colon cancer in the past which was resected.  He has been transfused a total of 3 units of PRBC.  Gastroenterology consulted.  Will likely need EGD/colonoscopy.  Continue on IV Protonix. 2. Community-acquired pneumonia.  Initial oxygen saturations 85% on room air, which improved after supplemental oxygen was applied.  Continue on intravenous antibiotics. 3. Acute kidney injury.  Baseline creatinine 1.4-1.9.  Creatinine on admission 2.3.  Likely related to hypotension and severe anemia.  Continue to follow, hold Lasix for now. 4. Possible UTI.  Follow-up urine cultures.  Currently on ceftriaxone. 5. History of nonischemic cardiomyopathy.  Chest x-ray shows pulmonary vascular congestion with layering pleural effusion.  Will repeat echocardiogram since  last EF was on echo from 2016 and was normal at that time.  Continue to hold Lasix for now. 6. Supratherapeutic INR.  Patient was taking warfarin.  This was reversed with Kcentra.  Continue to follow. 7. Chronic atrial fibrillation.  Currently having rapid ventricular response.  Will resume home dose of metoprolol since blood pressure stable.  Hold anticoagulation in the setting of anemia. 8. Diabetes.  Continue to follow blood sugars.  Holding home dose of Trulicity, Metformin, [place].  Continue on sliding scale insulin.  Blood sugars currently stable.   DVT prophylaxis: SCDs Code Status: Full code Family Communication: None present Disposition Plan: Discharge home once improved   Consultants:   Gastroenterology  Procedures:     Antimicrobials:       Subjective: Overall weakness is better since he came to the hospital.  Denies any shortness of breath or chest pain.  Objective: Vitals:   12/26/18 1700 12/26/18 1750 12/26/18 1800 12/26/18 1900  BP: 128/73  121/62 133/89  Pulse: (!) 105  (!) 110 (!) 122  Resp: 17  19 (!) 26  Temp:      TempSrc:      SpO2: 99%  98% 98%  Weight:  108.8 kg    Height:  5' 4"  (1.626 m)      Intake/Output Summary (Last 24 hours) at 12/26/2018 1941 Last data filed at 12/26/2018 1829 Gross per 24 hour  Intake 1577.9 ml  Output --  Net 1577.9 ml   Filed Weights   12/25/18 2057 12/26/18 1750  Weight: 113.6 kg 108.8 kg    Examination:  General exam: Appears calm and comfortable  Respiratory system: Clear to auscultation. Respiratory effort normal. Cardiovascular system: S1 & S2 heard, RRR. No JVD, murmurs, rubs, gallops or clicks. No pedal edema. Gastrointestinal system: Abdomen is nondistended, soft and nontender. No organomegaly or masses felt. Normal bowel sounds heard. Central nervous system: Alert and oriented. No focal neurological deficits. Extremities: Symmetric 5 x 5 power. Skin: No rashes, lesions or  ulcers Psychiatry: Judgement and insight appear normal. Mood & affect appropriate.     Data Reviewed: I have personally reviewed following labs and imaging studies  CBC: Recent Labs  Lab 12/25/18 1902 12/26/18 0355  WBC 7.6 7.0  HGB 5.1* 6.5*  HCT 19.5* 23.2*  MCV 84.8 87.2  PLT 184 170   Basic Metabolic Panel: Recent Labs  Lab 12/25/18 1902 12/26/18 0355  NA 137 139  K 4.7 4.3  CL 109 109  CO2 19* 19*  GLUCOSE 143* 114*  BUN 78* 77*  CREATININE 2.31* 2.20*  CALCIUM 8.4* 8.4*   GFR: Estimated Creatinine Clearance: 36.9 mL/min (A) (by C-G formula based on SCr of 2.2 mg/dL (H)). Liver Function Tests: Recent Labs  Lab 12/25/18 1902  AST 12*  ALT 10  ALKPHOS 54  BILITOT 1.0  PROT 6.4*  ALBUMIN 3.1*   No results for input(s): LIPASE, AMYLASE in the last 168 hours. No results for input(s): AMMONIA in the last 168 hours. Coagulation Profile: Recent Labs  Lab 12/25/18 1902 12/26/18 0355  INR >10.0* 1.9*   Cardiac Enzymes: No results for input(s): CKTOTAL, CKMB, CKMBINDEX, TROPONINI in the last 168 hours. BNP (last 3 results) No results for input(s): PROBNP in the last 8760 hours. HbA1C: Recent Labs    12/25/18 1902  HGBA1C 4.9   CBG: Recent Labs  Lab 12/25/18 2338 12/26/18 0310 12/26/18 0907 12/26/18 1214 12/26/18 1800  GLUCAP 123* 110* 89 113* 112*   Lipid Profile: No results for input(s): CHOL, HDL, LDLCALC, TRIG, CHOLHDL, LDLDIRECT in the last 72 hours. Thyroid Function Tests: No results for input(s): TSH, T4TOTAL, FREET4, T3FREE, THYROIDAB in the last 72 hours. Anemia Panel: No results for input(s): VITAMINB12, FOLATE, FERRITIN, TIBC, IRON, RETICCTPCT in the last 72 hours. Sepsis Labs: Recent Labs  Lab 12/25/18 1902  LATICACIDVEN 1.8    Recent Results (from the past 240 hour(s))  Blood Culture x 1     Status: None (Preliminary result)   Collection Time: 12/25/18  7:03 PM   Specimen: BLOOD  Result Value Ref Range Status   Specimen  Description BLOOD LEFT ANTECUBITAL  Final   Special Requests   Final    BOTTLES DRAWN AEROBIC AND ANAEROBIC Blood Culture adequate volume   Culture   Final    NO GROWTH < 12 HOURS Performed at [facility], [address] [place],  [address]    Report Status PENDING  Incomplete  SARS CORONAVIRUS 2 (TAT 6-24 HRS) Nasopharyngeal Nasopharyngeal Swab     Status: None   Collection Time: 12/25/18  8:32 PM   Specimen: Nasopharyngeal Swab  Result Value Ref Range Status   SARS Coronavirus 2 NEGATIVE NEGATIVE Final    Comment: (NOTE) SARS-CoV-2 target nucleic acids are NOT DETECTED. The SARS-CoV-2 RNA is generally detectable in upper and lower respiratory specimens during the acute phase of infection. Negative results do not preclude SARS-CoV-2 infection, do not rule out co-infections with other pathogens, and should not be used as the sole basis for treatment or other patient management decisions. Negative results must be combined with clinical observations, patient history, and epidemiological information. The expected  result is Negative. Fact Sheet for Patients: SugarRoll.be Fact Sheet for Healthcare Providers: https://www.woods-mathews.com/ This test is not yet approved or cleared by the [place] FDA and  has been authorized for detection and/or diagnosis of SARS-CoV-2 by FDA under an Emergency Use Authorization (EUA). This EUA will remain  in effect (meaning this test can be used) for the duration of the COVID-19 declaration under Section 56 4(b)(1) of the Act, 21 U.S.C. section 360bbb-3(b)(1), unless the authorization is terminated or revoked sooner. Performed at [facility], [place] [address] [place], [place] [address]          Radiology Studies: CT ABDOMEN PELVIS WO CONTRAST  Result Date: 12/26/2018 CLINICAL DATA:  Acute anemia, elevated INR, exclude intra-abdominal bleeding EXAM: CT ABDOMEN AND PELVIS WITHOUT  CONTRAST TECHNIQUE: Multidetector CT imaging of the abdomen and pelvis was performed following the standard protocol without IV contrast. COMPARISON:  Abdominal ultrasound September 03, 2018 CT abdomen pelvis 02/18/1999 (report only) FINDINGS: Lower chest: Patchy opacity seen in the lingula. Trace left pleural effusion with some adjacent atelectasis. No right effusion. Right lung bases clear. There is cardiomegaly with four-chamber enlargement. Dense mitral annular calcification is noted. Atherosclerotic calcification of the coronary arteries. Hepatobiliary: Distal esophagus, stomach and duodenal sweep are unremarkable. No small bowel wall thickening or dilatation. No evidence of obstruction. Postsurgical changes are present Pancreas: Unremarkable. No pancreatic ductal dilatation or surrounding inflammatory changes. Spleen: Normal in size without focal abnormality. Small accessory splenule Adrenals/Urinary Tract: Normal adrenal glands. Mild bilateral symmetric perinephric stranding, a nonspecific finding though may correlate with either age or decreased renal function. Kidneys are otherwise unremarkable, without renal calculi, suspicious lesion, or hydronephrosis. Bladder is unremarkable. Mild bladder wall thickening with indentation of the bladder base by the borderline enlarged prostate with median lobe hypertrophy. Stomach/Bowel: Distal esophagus, stomach and duodenal sweep are unremarkable. No small bowel wall thickening or dilatation. No evidence of obstruction. Question some postsurgical changes of the proximal colon suggestive of prior partial right hemicolectomy with ileocolic anastomosis. Remaining colonic segments are free of dilatation or wall thickening. Vascular/Lymphatic: Extensive atherosclerotic calcification of the abdominal aorta and branch vessels. No visible aneurysm or ectasia. No suspicious or enlarged lymph nodes in the included lymphatic chains. Reproductive: Prostatomegaly with median lobe  hypertrophy. Few coarse eccentric calcifications are noted. Seminal vesicles are unremarkable. Other: Diffuse mild body wall edema. No retroperitoneal or intraperitoneal hemorrhage is identified. Postsurgical changes of the low vertical midline abdomen are noted. Small fat containing umbilical hernia is seen. Small fat containing inguinal hernias. No bowel containing hernias. Musculoskeletal: There is diffuse fatty atrophy of the abdominal wall and pelvic musculature. Multilevel degenerative changes are present in the imaged portions of the spine. Multilevel flowing anterior osteophytosis, compatible with features of diffuse idiopathic skeletal hyperostosis (DISH). No acute osseous abnormality or suspicious osseous lesion. IMPRESSION: 1. No evidence of retroperitoneal or intraperitoneal hemorrhage. 2. Cardiomegaly with four-chamber enlargement. Dense mitral annular calcification. 3. Trace left pleural effusion with some adjacent atelectasis. 4. Patchy opacity in the lingula, may represent atelectasis or acute infection/inflammation. 5. Postsurgical changes in the abdomen appear to reflect a right hemicolectomy with patent ileocolic anastomosis. Correlate for surgical history. 6. Mild bilateral symmetric perinephric stranding, a nonspecific finding though may correlate with either age or decreased renal function. 7. Prostatomegaly with median lobe hypertrophy. Mild bladder wall thickening with indentation of the bladder base by the borderline enlarged prostate with median lobe hypertrophy. Correlate for symptoms of outlet obstructing consider urinalysis to exclude superimposed cystitis.  8. Small fat containing umbilical and inguinal hernias. No bowel containing hernias. 9. Aortic Atherosclerosis (ICD10-I70.0). Electronically Signed   By: Lovena Le M.D.   On: 12/26/2018 03:03   XR Chest Single View  Result Date: 12/25/2018 CLINICAL DATA:  Generalized weakness and dizziness for 2 days. EXAM: PORTABLE CHEST 1  VIEW COMPARISON:  Single-view of the chest 05/17/2014. FINDINGS: Hazy opacity over the left chest is consistent with a layering pleural effusion and airspace disease in the mid and lower lung zones. Pleural thickening or small effusion on the right is noted. There is cardiomegaly and pulmonary vascular congestion. Atherosclerosis is noted. No pneumothorax. No acute bony abnormality. IMPRESSION: Findings compatible with a layering left pleural effusion and airspace disease which could be atelectasis or pneumonia. Cardiomegaly and pulmonary vascular congestion. Atherosclerosis. Electronically Signed   By: Inge Rise M.D.   On: 12/25/2018 19:01        Scheduled Meds: . allopurinol  300 mg Oral Daily  . atorvastatin  20 mg Oral q1800  . Chlorhexidine Gluconate Cloth  6 each Topical Daily  . insulin aspart  0-15 Units Subcutaneous Q4H  . mouth rinse  15 mL Mouth Rinse BID  . metoprolol tartrate  50 mg Oral BID  . pantoprazole (PROTONIX) IV  40 mg Intravenous QHS   Continuous Infusions: . azithromycin Stopped (12/25/18 2114)  . cefTRIAXone (ROCEPHIN)  IV 2 g (12/26/18 1821)     LOS: 1 day    Time spent: 16mns    JKathie Dike MD [facility]   If 7PM-7AM, please contact night-coverage [email or web address]  12/26/2018, 7:41 PM

## 2018-12-26 NOTE — ED Notes (Addendum)
Pt resting comfortably at this time.

## 2018-12-26 NOTE — ED Notes (Signed)
Patient transported to CT 

## 2018-12-26 NOTE — ED Notes (Signed)
Called AC for Vit K infusion

## 2018-12-26 NOTE — ED Notes (Signed)
Transferred pt to hospital bed from ED stretcher for pt comfort.

## 2018-12-26 NOTE — ED Notes (Signed)
William Flores (daughter) requesting updates. Pt gives verbal consent to updating daughter. [phone]

## 2018-12-27 DIAGNOSIS — D649 Anemia, unspecified: Secondary | ICD-10-CM

## 2018-12-27 DIAGNOSIS — Z85038 Personal history of other malignant neoplasm of large intestine: Secondary | ICD-10-CM

## 2018-12-27 DIAGNOSIS — K922 Gastrointestinal hemorrhage, unspecified: Principal | ICD-10-CM

## 2018-12-27 LAB — TYPE AND SCREEN
ABO/RH(D): O POS
Antibody Screen: NEGATIVE
Unit division: 0
Unit division: 0
Unit division: 0

## 2018-12-27 LAB — BASIC METABOLIC PANEL
Anion gap: 11 (ref 5–15)
BUN: 51 mg/dL — ABNORMAL HIGH (ref 8–23)
CO2: 18 mmol/L — ABNORMAL LOW (ref 22–32)
Calcium: 8.4 mg/dL — ABNORMAL LOW (ref 8.9–10.3)
Chloride: 109 mmol/L (ref 98–111)
Creatinine, Ser: 1.57 mg/dL — ABNORMAL HIGH (ref 0.61–1.24)
GFR calc Af Amer: 52 mL/min — ABNORMAL LOW (ref >60)
GFR calc non Af Amer: 45 mL/min — ABNORMAL LOW (ref >60)
Glucose, Bld: 116 mg/dL — ABNORMAL HIGH (ref 70–99)
Potassium: 3.9 mmol/L (ref 3.5–5.1)
Sodium: 138 mmol/L (ref 135–145)

## 2018-12-27 LAB — BPAM RBC
Blood Product Expiration Date: 202012232359
Blood Product Expiration Date: 202101242359
Blood Product Expiration Date: 202101242359
ISSUE DATE / TIME: 202012222146
ISSUE DATE / TIME: 202012230016
ISSUE DATE / TIME: 202012230923
Unit Type and Rh: 5100
Unit Type and Rh: 5100
Unit Type and Rh: 9500

## 2018-12-27 LAB — CBC
HCT: 26.1 % — ABNORMAL LOW (ref 39.0–52.0)
Hemoglobin: 7.5 g/dL — ABNORMAL LOW (ref 13.0–17.0)
MCH: 24.8 pg — ABNORMAL LOW (ref 26.0–34.0)
MCHC: 28.7 g/dL — ABNORMAL LOW (ref 30.0–36.0)
MCV: 86.4 fL (ref 80.0–100.0)
Platelets: 168 10*3/uL (ref 150–400)
RBC: 3.02 MIL/uL — ABNORMAL LOW (ref 4.22–5.81)
RDW: 20.5 % — ABNORMAL HIGH (ref 11.5–15.5)
WBC: 7.1 10*3/uL (ref 4.0–10.5)
nRBC: 0.4 % — ABNORMAL HIGH (ref 0.0–0.2)

## 2018-12-27 LAB — CORTISOL-AM, BLOOD: Cortisol - AM: 14 ug/dL (ref 6.7–22.6)

## 2018-12-27 LAB — GLUCOSE, CAPILLARY
Glucose-Capillary: 114 mg/dL — ABNORMAL HIGH (ref 70–99)
Glucose-Capillary: 116 mg/dL — ABNORMAL HIGH (ref 70–99)
Glucose-Capillary: 168 mg/dL — ABNORMAL HIGH (ref 70–99)
Glucose-Capillary: 159 mg/dL — ABNORMAL HIGH (ref 70–99)
Glucose-Capillary: 140 mg/dL — ABNORMAL HIGH (ref 70–99)

## 2018-12-27 LAB — PROTIME-INR
INR: 1.5 — ABNORMAL HIGH (ref 0.8–1.2)
Prothrombin Time: 17.8 seconds — ABNORMAL HIGH (ref 11.4–15.2)

## 2018-12-27 LAB — MRSA PCR SCREENING: MRSA by PCR: NEGATIVE

## 2018-12-27 NOTE — Progress Notes (Signed)
PROGRESS NOTE    William Flores  KGU:[id number] DOB: [birth year] DOA: 12/25/2018 PCP: [name], [name], MD    Brief Narrative:  68 year old male with a history of hypertension, diabetes, chronic kidney disease stage III, atrial fibrillation, presents with worsening generalized weakness.  Found to have significant anemia with a hemoglobin of 5.1.  Creatinine elevated at 2.3.  INR supratherapeutic at greater than 10 and was reversed with Kcentra.  Chest x-ray indicated possible pneumonia.  He was started on intravenous antibiotics and transfused PRBC.  Admitted for further work-up of anemia including GI evaluation.   Assessment & Plan:   Active Problems:   History of colon cancer   Morbid obesity (HCC)   OSA (obstructive sleep apnea)   Atrial fibrillation with slow ventricular response (HCC)   AKI (acute kidney injury) ([place])   DM (diabetes mellitus), type 2, uncontrolled, with renal complications ([place])   Essential hypertension   CKD stage 3 due to type 2 diabetes mellitus ([place])   Acute anemia   1. Acute blood loss anemia likely related to GI bleeding.  Patient reports noticing dark-colored stools intermittently.  He does have a history of colon cancer in the past which was resected.  He has been transfused a total of 3 units of PRBC.  Gastroenterology consulted.  Will likely need EGD/colonoscopy.  Continue on IV Protonix. 2. Community-acquired pneumonia.  Initial oxygen saturations 85% on room air, which improved after supplemental oxygen was applied.  Continue on intravenous antibiotics. 3. Acute kidney injury.  Baseline creatinine 1.4-1.9.  Creatinine on admission 2.3.  Likely related to hypotension and severe anemia.  Lasix currently on hold.  Overall creatinine has improved to 1.5 today. 4. UTI.  Urine culture positive for staph epidermidis.  On IV antibiotics 5. History of nonischemic cardiomyopathy.  Chest x-ray shows pulmonary vascular congestion with layering pleural effusion.   Will repeat echocardiogram since last EF was on echo from 2016 and was normal at that time.  Continue to hold Lasix for now. 6. Supratherapeutic INR.  Patient was taking warfarin.  This was reversed with Kcentra.  Continue to follow. 7. Chronic atrial fibrillation.  Currently on home dose of metoprolol.  Heart rate is stable.  Hold anticoagulation in the setting of anemia. 8. Diabetes.  Continue to follow blood sugars.  Holding home dose of Trulicity, Metformin, [place].  Continue on sliding scale insulin.  Blood sugars currently stable.   DVT prophylaxis: SCDs Code Status: Full code Family Communication: None present Disposition Plan: Discharge home once improved   Consultants:   Gastroenterology  Procedures:     Antimicrobials:       Subjective: He does have a mild cough, feels breathing is improving.  No chest pain  Objective: Vitals:   12/27/18 1600 12/27/18 1612 12/27/18 1700 12/27/18 1800  BP: 97/77  102/72 112/90  Pulse: 86 (!) 102 (!) 102 93  Resp: (!) 22 16 (!) 29 20  Temp:  98.6 F (37 C)    TempSrc:  Oral    SpO2: 91% 91% 100% 98%  Weight:      Height:        Intake/Output Summary (Last 24 hours) at 12/27/2018 1925 Last data filed at 12/27/2018 1834 Gross per 24 hour  Intake 862.02 ml  Output 1500 ml  Net -637.98 ml   Filed Weights   12/25/18 2057 12/26/18 1750 12/27/18 0500  Weight: 113.6 kg 108.8 kg 109.7 kg    Examination:  General exam: Alert, awake, oriented x  3 Respiratory system: Clear to auscultation. Respiratory effort normal. Cardiovascular system:RRR. No murmurs, rubs, gallops. Gastrointestinal system: Abdomen is nondistended, soft and nontender. No organomegaly or masses felt. Normal bowel sounds heard. Central nervous system: Alert and oriented. No focal neurological deficits. Extremities: No C/C/E, +pedal pulses Skin: No rashes, lesions or ulcers Psychiatry: Judgement and insight appear normal. Mood & affect  appropriate.    Data Reviewed: I have personally reviewed following labs and imaging studies  CBC: Recent Labs  Lab 12/25/18 1902 12/26/18 0355 12/26/18 2027 12/27/18 0416  WBC 7.6 7.0 7.9 7.1  HGB 5.1* 6.5* 7.9* 7.5*  HCT 19.5* 23.2* 27.6* 26.1*  MCV 84.8 87.2 87.3 86.4  PLT 184 165 170 638   Basic Metabolic Panel: Recent Labs  Lab 12/25/18 1902 12/26/18 0355 12/27/18 0416  NA 137 139 138  K 4.7 4.3 3.9  CL 109 109 109  CO2 19* 19* 18*  GLUCOSE 143* 114* 116*  BUN 78* 77* 51*  CREATININE 2.31* 2.20* 1.57*  CALCIUM 8.4* 8.4* 8.4*   GFR: Estimated Creatinine Clearance: 52 mL/min (A) (by C-G formula based on SCr of 1.57 mg/dL (H)). Liver Function Tests: Recent Labs  Lab 12/25/18 1902  AST 12*  ALT 10  ALKPHOS 54  BILITOT 1.0  PROT 6.4*  ALBUMIN 3.1*   No results for input(s): LIPASE, AMYLASE in the last 168 hours. No results for input(s): AMMONIA in the last 168 hours. Coagulation Profile: Recent Labs  Lab 12/25/18 1902 12/26/18 0355 12/27/18 0416  INR >10.0* 1.9* 1.5*   Cardiac Enzymes: No results for input(s): CKTOTAL, CKMB, CKMBINDEX, TROPONINI in the last 168 hours. BNP (last 3 results) No results for input(s): PROBNP in the last 8760 hours. HbA1C: Recent Labs    12/25/18 1902  HGBA1C 4.9   CBG: Recent Labs  Lab 12/26/18 2047 12/27/18 0413 12/27/18 0729 12/27/18 1150 12/27/18 1611  GLUCAP 250* 114* 116* 168* 159*   Lipid Profile: No results for input(s): CHOL, HDL, LDLCALC, TRIG, CHOLHDL, LDLDIRECT in the last 72 hours. Thyroid Function Tests: No results for input(s): TSH, T4TOTAL, FREET4, T3FREE, THYROIDAB in the last 72 hours. Anemia Panel: Recent Labs    12/25/18 2100 12/26/18 2027  VITAMINB12 392  --   FOLATE  --  6.0  FERRITIN 35  --   TIBC 380  --   IRON 17*  --    Sepsis Labs: Recent Labs  Lab 12/25/18 1902  LATICACIDVEN 1.8    Recent Results (from the past 240 hour(s))  Culture, Urine     Status: Abnormal  (Preliminary result)   Collection Time: 12/25/18  6:20 PM   Specimen: Urine, Random  Result Value Ref Range Status   Specimen Description   Final    URINE, RANDOM Performed at [facility], [address], [place], [place] [address]    Special Requests   Final    NONE Performed at [facility], [address], [place], [place] [address]    Culture >=100,000 COLONIES/mL STAPHYLOCOCCUS EPIDERMIDIS (A)  Final   Report Status PENDING  Incomplete  Blood Culture x 1     Status: None (Preliminary result)   Collection Time: 12/25/18  7:03 PM   Specimen: BLOOD  Result Value Ref Range Status   Specimen Description BLOOD LEFT ANTECUBITAL  Final   Special Requests   Final    BOTTLES DRAWN AEROBIC AND ANAEROBIC Blood Culture adequate volume   Culture   Final    NO GROWTH 2 DAYS Performed at [facility], [address] [place], [place] [address]    Report Status PENDING  Incomplete  SARS CORONAVIRUS 2 (TAT 6-24 HRS) Nasopharyngeal Nasopharyngeal Swab     Status: None   Collection Time: 12/25/18  8:32 PM   Specimen: Nasopharyngeal Swab  Result Value Ref Range Status   SARS Coronavirus 2 NEGATIVE NEGATIVE Final    Comment: (NOTE) SARS-CoV-2 target nucleic acids are NOT DETECTED. The SARS-CoV-2 RNA is generally detectable in upper and lower respiratory specimens during the acute phase of infection. Negative results do not preclude SARS-CoV-2 infection, do not rule out co-infections with other pathogens, and should not be used as the sole basis for treatment or other patient management decisions. Negative results must be combined with clinical observations, patient history, and epidemiological information. The expected result is Negative. Fact Sheet for Patients: SugarRoll.be Fact Sheet for Healthcare Providers: https://www.woods-mathews.com/ This test is not yet approved or cleared by the [place] FDA and  has been authorized for detection  and/or diagnosis of SARS-CoV-2 by FDA under an Emergency Use Authorization (EUA). This EUA will remain  in effect (meaning this test can be used) for the duration of the COVID-19 declaration under Section 56 4(b)(1) of the Act, 21 U.S.C. section 360bbb-3(b)(1), unless the authorization is terminated or revoked sooner. Performed at [facility], [place] [address], [place], [place] [address]   MRSA PCR Screening     Status: None   Collection Time: 12/26/18  4:47 PM   Specimen: Nasopharyngeal  Result Value Ref Range Status   MRSA by PCR NEGATIVE NEGATIVE Final    Comment:        The GeneXpert MRSA Assay (FDA approved for NASAL specimens only), is one component of a comprehensive MRSA colonization surveillance program. It is not intended to diagnose MRSA infection nor to guide or monitor treatment for MRSA infections. Performed at [facility], [address] [place], [place] [address]          Radiology Studies: CT ABDOMEN PELVIS WO CONTRAST  Result Date: 12/26/2018 CLINICAL DATA:  Acute anemia, elevated INR, exclude intra-abdominal bleeding EXAM: CT ABDOMEN AND PELVIS WITHOUT CONTRAST TECHNIQUE: Multidetector CT imaging of the abdomen and pelvis was performed following the standard protocol without IV contrast. COMPARISON:  Abdominal ultrasound September 03, 2018 CT abdomen pelvis 02/18/1999 (report only) FINDINGS: Lower chest: Patchy opacity seen in the lingula. Trace left pleural effusion with some adjacent atelectasis. No right effusion. Right lung bases clear. There is cardiomegaly with four-chamber enlargement. Dense mitral annular calcification is noted. Atherosclerotic calcification of the coronary arteries. Hepatobiliary: Distal esophagus, stomach and duodenal sweep are unremarkable. No small bowel wall thickening or dilatation. No evidence of obstruction. Postsurgical changes are present Pancreas: Unremarkable. No pancreatic ductal dilatation or surrounding inflammatory  changes. Spleen: Normal in size without focal abnormality. Small accessory splenule Adrenals/Urinary Tract: Normal adrenal glands. Mild bilateral symmetric perinephric stranding, a nonspecific finding though may correlate with either age or decreased renal function. Kidneys are otherwise unremarkable, without renal calculi, suspicious lesion, or hydronephrosis. Bladder is unremarkable. Mild bladder wall thickening with indentation of the bladder base by the borderline enlarged prostate with median lobe hypertrophy. Stomach/Bowel: Distal esophagus, stomach and duodenal sweep are unremarkable. No small bowel wall thickening or dilatation. No evidence of obstruction. Question some postsurgical changes of the proximal colon suggestive of prior partial right hemicolectomy with ileocolic anastomosis. Remaining colonic segments are free of dilatation or wall thickening. Vascular/Lymphatic: Extensive atherosclerotic calcification of the abdominal aorta and branch vessels. No visible  aneurysm or ectasia. No suspicious or enlarged lymph nodes in the included lymphatic chains. Reproductive: Prostatomegaly with median lobe hypertrophy. Few coarse eccentric calcifications are noted. Seminal vesicles are unremarkable. Other: Diffuse mild body wall edema. No retroperitoneal or intraperitoneal hemorrhage is identified. Postsurgical changes of the low vertical midline abdomen are noted. Small fat containing umbilical hernia is seen. Small fat containing inguinal hernias. No bowel containing hernias. Musculoskeletal: There is diffuse fatty atrophy of the abdominal wall and pelvic musculature. Multilevel degenerative changes are present in the imaged portions of the spine. Multilevel flowing anterior osteophytosis, compatible with features of diffuse idiopathic skeletal hyperostosis (DISH). No acute osseous abnormality or suspicious osseous lesion. IMPRESSION: 1. No evidence of retroperitoneal or intraperitoneal hemorrhage. 2.  Cardiomegaly with four-chamber enlargement. Dense mitral annular calcification. 3. Trace left pleural effusion with some adjacent atelectasis. 4. Patchy opacity in the lingula, may represent atelectasis or acute infection/inflammation. 5. Postsurgical changes in the abdomen appear to reflect a right hemicolectomy with patent ileocolic anastomosis. Correlate for surgical history. 6. Mild bilateral symmetric perinephric stranding, a nonspecific finding though may correlate with either age or decreased renal function. 7. Prostatomegaly with median lobe hypertrophy. Mild bladder wall thickening with indentation of the bladder base by the borderline enlarged prostate with median lobe hypertrophy. Correlate for symptoms of outlet obstructing consider urinalysis to exclude superimposed cystitis. 8. Small fat containing umbilical and inguinal hernias. No bowel containing hernias. 9. Aortic Atherosclerosis (ICD10-I70.0). Electronically Signed   By: Lovena Le M.D.   On: 12/26/2018 03:03        Scheduled Meds: . allopurinol  300 mg Oral Daily  . atorvastatin  20 mg Oral q1800  . Chlorhexidine Gluconate Cloth  6 each Topical Daily  . insulin aspart  0-15 Units Subcutaneous Q4H  . mouth rinse  15 mL Mouth Rinse BID  . metoprolol tartrate  50 mg Oral BID  . pantoprazole (PROTONIX) IV  40 mg Intravenous QHS   Continuous Infusions: . azithromycin 500 mg (12/27/18 1837)  . cefTRIAXone (ROCEPHIN)  IV 2 g (12/27/18 1800)     LOS: 2 days    Time spent: 11mns    JKathie Dike MD [facility]   If 7PM-7AM, please contact night-coverage [email or web address]  12/27/2018, 7:25 PM

## 2018-12-27 NOTE — Progress Notes (Signed)
Patient without complaints.  Says he is breathing better. Says he had 3 dark brown stools overnight.  Nursing staff observed normal-appearing bowel movements (staff unaware Hemoccults were ordered-not done)  Vital signs in last 24 hours: Temp:  [97.7 F (36.5 C)-98.8 F (37.1 C)] 98.1 F (36.7 C) (12/24 1151) Pulse Rate:  [78-122] 99 (12/24 1400) Resp:  [17-27] 19 (12/24 1400) BP: (96-141)/(48-119) 120/78 (12/24 1300) SpO2:  [89 %-99 %] 97 % (12/24 1400) Weight:  [108.8 kg-109.7 kg] 109.7 kg (12/24 0500) Last BM Date: 12/26/18 General:   Alert,  pleasant and cooperative in NAD Abdomen: Nondistended.  Positive bowel sounds soft nontender. Extremities:  Without clubbing or edema.    Intake/Output from previous day: 12/23 0701 - 12/24 0700 In: 1134.7 [I.V.:20; Blood:315; IV Piggyback:799.7] Out: 500 [Urine:500] Intake/Output this shift: No intake/output data recorded.  Lab Results: Recent Labs    12/26/18 0355 12/26/18 2027 12/27/18 0416  WBC 7.0 7.9 7.1  HGB 6.5* 7.9* 7.5*  HCT 23.2* 27.6* 26.1*  PLT 165 170 168   BMET Recent Labs    12/25/18 1902 12/26/18 0355 12/27/18 0416  NA 137 139 138  K 4.7 4.3 3.9  CL 109 109 109  CO2 19* 19* 18*  GLUCOSE 143* 114* 116*  BUN 78* 77* 51*  CREATININE 2.31* 2.20* 1.57*  CALCIUM 8.4* 8.4* 8.4*   LFT Recent Labs    12/25/18 1902  PROT 6.4*  ALBUMIN 3.1*  AST 12*  ALT 10  ALKPHOS 54  BILITOT 1.0   PT/INR Recent Labs    12/26/18 0355 12/27/18 0416  LABPROT 21.8* 17.8*  INR 1.9* 1.5*    Ferritin 35, iron 17, TIBC 380, saturation 4%; fasting cortisol pending  CT ABDOMEN PELVIS WO CONTRAST  Result Date: 12/26/2018 CLINICAL DATA:  Acute anemia, elevated INR, exclude intra-abdominal bleeding EXAM: CT ABDOMEN AND PELVIS WITHOUT CONTRAST TECHNIQUE: Multidetector CT imaging of the abdomen and pelvis was performed following the standard protocol without IV contrast. COMPARISON:  Abdominal ultrasound September 03, 2018  CT abdomen pelvis 02/18/1999 (report only) FINDINGS: Lower chest: Patchy opacity seen in the lingula. Trace left pleural effusion with some adjacent atelectasis. No right effusion. Right lung bases clear. There is cardiomegaly with four-chamber enlargement. Dense mitral annular calcification is noted. Atherosclerotic calcification of the coronary arteries. Hepatobiliary: Distal esophagus, stomach and duodenal sweep are unremarkable. No small bowel wall thickening or dilatation. No evidence of obstruction. Postsurgical changes are present Pancreas: Unremarkable. No pancreatic ductal dilatation or surrounding inflammatory changes. Spleen: Normal in size without focal abnormality. Small accessory splenule Adrenals/Urinary Tract: Normal adrenal glands. Mild bilateral symmetric perinephric stranding, a nonspecific finding though may correlate with either age or decreased renal function. Kidneys are otherwise unremarkable, without renal calculi, suspicious lesion, or hydronephrosis. Bladder is unremarkable. Mild bladder wall thickening with indentation of the bladder base by the borderline enlarged prostate with median lobe hypertrophy. Stomach/Bowel: Distal esophagus, stomach and duodenal sweep are unremarkable. No small bowel wall thickening or dilatation. No evidence of obstruction. Question some postsurgical changes of the proximal colon suggestive of prior partial right hemicolectomy with ileocolic anastomosis. Remaining colonic segments are free of dilatation or wall thickening. Vascular/Lymphatic: Extensive atherosclerotic calcification of the abdominal aorta and branch vessels. No visible aneurysm or ectasia. No suspicious or enlarged lymph nodes in the included lymphatic chains. Reproductive: Prostatomegaly with median lobe hypertrophy. Few coarse eccentric calcifications are noted. Seminal vesicles are unremarkable. Other: Diffuse mild body wall edema. No retroperitoneal or intraperitoneal hemorrhage is  identified. Postsurgical  changes of the low vertical midline abdomen are noted. Small fat containing umbilical hernia is seen. Small fat containing inguinal hernias. No bowel containing hernias. Musculoskeletal: There is diffuse fatty atrophy of the abdominal wall and pelvic musculature. Multilevel degenerative changes are present in the imaged portions of the spine. Multilevel flowing anterior osteophytosis, compatible with features of diffuse idiopathic skeletal hyperostosis (DISH). No acute osseous abnormality or suspicious osseous lesion. IMPRESSION: 1. No evidence of retroperitoneal or intraperitoneal hemorrhage. 2. Cardiomegaly with four-chamber enlargement. Dense mitral annular calcification. 3. Trace left pleural effusion with some adjacent atelectasis. 4. Patchy opacity in the lingula, may represent atelectasis or acute infection/inflammation. 5. Postsurgical changes in the abdomen appear to reflect a right hemicolectomy with patent ileocolic anastomosis. Correlate for surgical history. 6. Mild bilateral symmetric perinephric stranding, a nonspecific finding though may correlate with either age or decreased renal function. 7. Prostatomegaly with median lobe hypertrophy. Mild bladder wall thickening with indentation of the bladder base by the borderline enlarged prostate with median lobe hypertrophy. Correlate for symptoms of outlet obstructing consider urinalysis to exclude superimposed cystitis. 8. Small fat containing umbilical and inguinal hernias. No bowel containing hernias. 9. Aortic Atherosclerosis (ICD10-I70.0). Electronically Signed   By: Lovena Le M.D.   On: 12/26/2018 03:03   XR Chest Single View  Result Date: 12/25/2018 CLINICAL DATA:  Generalized weakness and dizziness for 2 days. EXAM: PORTABLE CHEST 1 VIEW COMPARISON:  Single-view of the chest 05/17/2014. FINDINGS: Hazy opacity over the left chest is consistent with a layering pleural effusion and airspace disease in the mid and lower  lung zones. Pleural thickening or small effusion on the right is noted. There is cardiomegaly and pulmonary vascular congestion. Atherosclerosis is noted. No pneumothorax. No acute bony abnormality. IMPRESSION: Findings compatible with a layering left pleural effusion and airspace disease which could be atelectasis or pneumonia. Cardiomegaly and pulmonary vascular congestion. Atherosclerosis. Electronically Signed   By: Inge Rise M.D.   On: 12/25/2018 19:01    Impression: 68 year old gentleman multiple comorbidities admitted with coagulopathy secondary to warfarin (now reversed), profound anemia.  Slant towards iron deficiency on indices.  Significant weight loss documented. Multiple bowel movements overnight.  No Hemoccults done. History of colon cancer; overdue for surveillance examination.  He needs to have a colonoscopy while he is here.  Would favor an EGD to follow if colonoscopy  unremarkable.  As discussed earlier with Dr. Roderic Palau, his pulmonary status appears to have improved.  Recommendations: Clear liquid diet.  Reassessment by Dr. Laural Golden tomorrow morning. Would anticipate colonoscopy and possible EGD in the next 48 hours.  Follow-up fasting cortisol results.  Discussed getting Hemoccults done with nursing staff.

## 2018-12-28 ENCOUNTER — Inpatient Hospital Stay (HOSPITAL_COMMUNITY): Payer: Medicare Other

## 2018-12-28 ENCOUNTER — Encounter (HOSPITAL_COMMUNITY)
Admission: EM | Disposition: A | Discharge status: Skilled Nursing Facility | Payer: Self-pay | Source: Home / Self Care | Attending: Internal Medicine

## 2018-12-28 DIAGNOSIS — I361 Nonrheumatic tricuspid (valve) insufficiency: Secondary | ICD-10-CM

## 2018-12-28 DIAGNOSIS — I272 Pulmonary hypertension, unspecified: Secondary | ICD-10-CM | POA: Diagnosis present

## 2018-12-28 DIAGNOSIS — I340 Nonrheumatic mitral (valve) insufficiency: Secondary | ICD-10-CM

## 2018-12-28 HISTORY — PX: ESOPHAGOGASTRODUODENOSCOPY: SHX5428

## 2018-12-28 HISTORY — PX: COLONOSCOPY: SHX5424

## 2018-12-28 LAB — CBC
HCT: 26.3 % — ABNORMAL LOW (ref 39.0–52.0)
Hemoglobin: 7.3 g/dL — ABNORMAL LOW (ref 13.0–17.0)
MCH: 24.5 pg — ABNORMAL LOW (ref 26.0–34.0)
MCHC: 27.8 g/dL — ABNORMAL LOW (ref 30.0–36.0)
MCV: 88.3 fL (ref 80.0–100.0)
Platelets: 153 10*3/uL (ref 150–400)
RBC: 2.98 MIL/uL — ABNORMAL LOW (ref 4.22–5.81)
RDW: 20.5 % — ABNORMAL HIGH (ref 11.5–15.5)
WBC: 5.8 10*3/uL (ref 4.0–10.5)
nRBC: 0.5 % — ABNORMAL HIGH (ref 0.0–0.2)

## 2018-12-28 LAB — GLUCOSE, CAPILLARY
Glucose-Capillary: 118 mg/dL — ABNORMAL HIGH (ref 70–99)
Glucose-Capillary: 117 mg/dL — ABNORMAL HIGH (ref 70–99)
Glucose-Capillary: 182 mg/dL — ABNORMAL HIGH (ref 70–99)
Glucose-Capillary: 123 mg/dL — ABNORMAL HIGH (ref 70–99)
Glucose-Capillary: 166 mg/dL — ABNORMAL HIGH (ref 70–99)

## 2018-12-28 LAB — URINE CULTURE: Culture: >=100000 — AB

## 2018-12-28 LAB — BASIC METABOLIC PANEL
Anion gap: 8 (ref 5–15)
BUN: 35 mg/dL — ABNORMAL HIGH (ref 8–23)
CO2: 20 mmol/L — ABNORMAL LOW (ref 22–32)
Calcium: 8.3 mg/dL — ABNORMAL LOW (ref 8.9–10.3)
Chloride: 108 mmol/L (ref 98–111)
Creatinine, Ser: 1.35 mg/dL — ABNORMAL HIGH (ref 0.61–1.24)
GFR calc Af Amer: >60 mL/min (ref >60)
GFR calc non Af Amer: 54 mL/min — ABNORMAL LOW (ref >60)
Glucose, Bld: 123 mg/dL — ABNORMAL HIGH (ref 70–99)
Potassium: 3.7 mmol/L (ref 3.5–5.1)
Sodium: 136 mmol/L (ref 135–145)

## 2018-12-28 LAB — ECHOCARDIOGRAM COMPLETE
Height: 64 in
Weight: 3869.51 oz

## 2018-12-28 LAB — PROTIME-INR
INR: 1.7 — ABNORMAL HIGH (ref 0.8–1.2)
Prothrombin Time: 20.1 seconds — ABNORMAL HIGH (ref 11.4–15.2)

## 2018-12-28 SURGERY — COLONOSCOPY
Anesthesia: Moderate Sedation

## 2018-12-28 MED ORDER — BISACODYL 5 MG PO TBEC: 10.0000 mg | DELAYED_RELEASE_TABLET | Freq: Once | ORAL | Status: DC

## 2018-12-28 MED ORDER — VITAMIN K1 10 MG/ML IJ SOLN
3.0000 mg | Freq: Once | INTRAMUSCULAR | Status: AC
  Administered 2018-12-28: 3 mg via SUBCUTANEOUS
  Filled 2018-12-28: qty 1

## 2018-12-28 MED ORDER — SODIUM CHLORIDE 0.9 % IV SOLN: INTRAVENOUS | Status: DC

## 2018-12-28 MED ORDER — FUROSEMIDE 80 MG PO TABS
80.0000 mg | ORAL_TABLET | Freq: Every day | ORAL | Status: DC
  Administered 2018-12-29 – 2019-01-01 (×4): 80 mg via ORAL
  Filled 2018-12-28 (×4): qty 1

## 2018-12-28 MED ORDER — MEPERIDINE HCL 50 MG/ML IJ SOLN
INTRAMUSCULAR | Status: AC
  Filled 2018-12-28: qty 1

## 2018-12-28 MED ORDER — MIDAZOLAM HCL 5 MG/5ML IJ SOLN
INTRAMUSCULAR | Status: AC
  Filled 2018-12-28: qty 10

## 2018-12-28 MED ORDER — MEPERIDINE HCL 50 MG/ML IJ SOLN
INTRAMUSCULAR | Status: DC | PRN
  Administered 2018-12-28: 25 mg via INTRAVENOUS

## 2018-12-28 MED ORDER — MIDAZOLAM HCL 5 MG/5ML IJ SOLN
INTRAMUSCULAR | Status: DC | PRN
  Administered 2018-12-28: 1 mg via INTRAVENOUS
  Administered 2018-12-28: 2 mg via INTRAVENOUS

## 2018-12-28 MED ORDER — PEG 3350-KCL-NA BICARB-NACL 420 G PO SOLR
4000.0000 mL | Freq: Once | ORAL | Status: AC
  Administered 2018-12-28: 4000 mL via ORAL

## 2018-12-28 MED ORDER — INSULIN ASPART 100 UNIT/ML ~~LOC~~ SOLN
0.0000 [IU] | Freq: Three times a day (TID) | SUBCUTANEOUS | Status: DC
  Administered 2018-12-29 – 2018-12-30 (×5): 2 [IU] via SUBCUTANEOUS
  Administered 2018-12-30: 13:00:00 3 [IU] via SUBCUTANEOUS
  Administered 2018-12-30: 17:00:00 2 [IU] via SUBCUTANEOUS
  Administered 2018-12-30: 22:00:00 3 [IU] via SUBCUTANEOUS
  Administered 2018-12-31: 2 [IU] via SUBCUTANEOUS
  Administered 2018-12-31 (×2): 3 [IU] via SUBCUTANEOUS
  Administered 2018-12-31: 2 [IU] via SUBCUTANEOUS
  Administered 2019-01-01: 3 [IU] via SUBCUTANEOUS

## 2018-12-28 MED ORDER — LIDOCAINE VISCOUS HCL 2 % MT SOLN
OROMUCOSAL | Status: AC
  Filled 2018-12-28: qty 15

## 2018-12-28 MED ORDER — PERFLUTREN LIPID MICROSPHERE
1.0000 mL | INTRAVENOUS | Status: AC | PRN
  Administered 2018-12-28: 3 mL via INTRAVENOUS
  Filled 2018-12-28: qty 10

## 2018-12-28 MED ORDER — METOCLOPRAMIDE HCL 10 MG PO TABS
5.0000 mg | ORAL_TABLET | Freq: Three times a day (TID) | ORAL | Status: DC
  Administered 2018-12-28 – 2019-01-01 (×13): 5 mg via ORAL
  Filled 2018-12-28 (×13): qty 1

## 2018-12-28 MED ORDER — ONDANSETRON HCL 4 MG/2ML IJ SOLN: 4.0000 mg | Freq: Once | INTRAMUSCULAR | Status: AC

## 2018-12-28 MED ORDER — LIDOCAINE VISCOUS HCL 2 % MT SOLN
OROMUCOSAL | Status: DC | PRN
  Administered 2018-12-28: 1 via OROMUCOSAL

## 2018-12-28 NOTE — Progress Notes (Signed)
  Echocardiogram 2D Echocardiogram with definity has been performed.  William Flores M 12/28/2018, 10:28 AM

## 2018-12-28 NOTE — Op Note (Addendum)
[facility] Patient Name: William Flores Procedure Date: 12/28/2018 1:38 PM MRN: [id number] Date of Birth: 08-Apr-1950 Attending MD: Hildred Laser , MD CSN: [id number] Age: 68 Admit Type: Inpatient Procedure:                Upper GI endoscopy Indications:              Iron deficiency anemia, Anorexia Providers:                Hildred Laser, MD, Lurline Del, RN, Nelda Severe, RN Referring MD:             Kathie Dike, MD Medicines:                Lidocaine spray, Meperidine 25 mg IV, Midazolam 3                            mg IV Complications:            No immediate complications. Estimated Blood Loss:     Estimated blood loss: none. Procedure:                Pre-Anesthesia Assessment:                           - Prior to the procedure, a History and Physical                            was performed, and patient medications and                            allergies were reviewed. The patient's tolerance of                            previous anesthesia was also reviewed. The risks                            and benefits of the procedure and the sedation                            options and risks were discussed with the patient.                            All questions were answered, and informed consent                            was obtained. Prior Anticoagulants: The patient                            last took Coumadin (warfarin) 4 days prior to the                            procedure. ASA Grade Assessment: III - A patient                            with severe systemic disease. After reviewing the  risks and benefits, the patient was deemed in                            satisfactory condition to undergo the procedure.                           After obtaining informed consent, the endoscope was                            passed under direct vision. Throughout the                            procedure, the patient's blood pressure, pulse, and                     oxygen saturations were monitored continuously. The                            GIF-H190 ([id number]) scope was introduced through the                            mouth, and advanced to the second part of duodenum.                            The upper GI endoscopy was accomplished without                            difficulty. The patient tolerated the procedure                            well. Scope In: 2:29:05 PM Scope Out: 2:37:03 PM Total Procedure Duration: 0 hours 7 minutes 58 seconds  Findings:      The examined esophagus was normal.      The Z-line was regular and was found 45 cm from the incisors.      Bilious fluid was found in the gastric body. Fluid aspiration was       performed through the scope suction channel. The amount of fluid       collected was 700 mL.      Patchy nodular mucosa was found in the gastric body. Biopsies were taken       with a cold forceps for histology. The pathology specimen was placed       into Bottle Number 1.      The exam of the stomach was otherwise normal.      The duodenal bulb and second portion of the duodenum were normal. Impression:               - Normal esophagus.                           - Z-line regular, 45 cm from the incisors.                           - Bilious gastric fluid. Fluid aspiration performed.                           -  Nodular mucosa in the gastric body. Biopsied.                           - Normal duodenal bulb and second portion of the                            duodenum. Moderate Sedation:      Moderate (conscious) sedation was administered by the endoscopy nurse       and supervised by the endoscopist. The following parameters were       monitored: oxygen saturation, heart rate, blood pressure, CO2       capnography and response to care. Total physician intraservice time was       11 minutes. Recommendation:           - Patient has a contact number available for                             emergencies. The signs and symptoms of potential                            delayed complications were discussed with the                            patient. Return to normal activities tomorrow.                            Written discharge instructions were provided to the                            patient.                           - Diabetic (ADA) diet today.                           - Continue present medications.                           - No aspirin, ibuprofen, naproxen, or other                            non-steroidal anti-inflammatory drugs.                           - Await pathology results.                           - Metoclopromide 5 mg po ac.                           - See the other procedure note for documentation of                            additional recommendations. Procedure Code(s):        --- Professional ---  23536, Esophagogastroduodenoscopy, flexible,                            transoral; with biopsy, single or multiple                           G0500, Moderate sedation services provided by the                            same physician or other qualified health care                            professional performing a gastrointestinal                            endoscopic service that sedation supports,                            requiring the presence of an independent trained                            observer to assist in the monitoring of the                            patient's level of consciousness and physiological                            status; initial 15 minutes of intra-service time;                            patient age 71 years or older (additional time may                            be reported with [phone], as appropriate) Diagnosis Code(s):        --- Professional ---                           K31.89, Other diseases of stomach and duodenum                           D50.9, Iron deficiency anemia, unspecified                            R63.0, Anorexia CPT copyright 2019 [employer]. All rights reserved. The codes documented in this report are preliminary and upon coder review may  be revised to meet current compliance requirements. Hildred Laser, MD Hildred Laser, MD 12/28/2018 4:23:03 PM This report has been signed electronically. Number of Addenda: 0

## 2018-12-28 NOTE — Progress Notes (Signed)
Brief EGD and colonoscopy notes  EGD. Normal mucosa of the esophagus duodenal bulb and second part of the duodenum. Large stomach with nodular mucosa at body biopsy taken. 700 mL of bilious fluid suctioned from the stomach. This finding may suggest gastroparesis.  Colonoscopy.  Normal mucosa of neoterminal ileum and ileocolonic anastomosis. Small polyp Faera cold biopsy from distal to anastomosis. Small polyp snared from distal to anastomosis but was lost. 7 polyps hot snared measuring 7 to 16 mm.  2 of these polyps are lost. 2 clips applied at the polypectomy site and transverse colon. 2 polyps from distal transverse colon and splenic flexure could not be removed because of hyperperistalsis.

## 2018-12-28 NOTE — Progress Notes (Signed)
Noticed patient having periods of sleep apnea with oxygen saturations dipping into the upper 70s. Pt difficult to arouse at times, but once awake he is oriented and appropriate.  RT notified, CPAP suggested. RT in room to assess and place on CPAP.

## 2018-12-28 NOTE — Progress Notes (Signed)
Pt up to Bathroom with nurse assist- Pt to floor, cool and clammy. AC on floor, patient coded.
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
## 2018-12-28 NOTE — Progress Notes (Signed)
Subjective:  Patient has no complaints.  He states his appetite is better.  He states his cough is just about gone.  He denies chest pain shortness of breath or abdominal pain.  He has not experienced melena or rectal bleeding since admission.    Objective: Blood pressure (!) 108/55, pulse 100, temperature 98 F (36.7 C), temperature source Oral, resp. rate 20, height 5' 4"  (1.626 m), weight 109.7 kg, SpO2 98 %. Patient is alert and sipping on his coffee. Conjunctivae is pale.  Sclerae nonicteric. Cardiac exam with regular rhythm normal S1 and S2.  Auscultation lungs reveal vesicular breath sounds bilaterally. Abdomen is full.  Bowel sounds are normal.  Long midline scar and umbilical hernia as before.  Abdomen is soft and nontender with organomegaly or masses. He does not have peripheral edema.  He has increased pigmentation to skin of both legs.  He has heel pads just for protection.  Labs/studies Results:  CBC Latest Ref Rng & Units 12/28/2018 12/27/2018 12/26/2018  WBC 4.0 - 10.5 K/uL 5.8 7.1 7.9  Hemoglobin 13.0 - 17.0 g/dL 7.3(L) 7.5(L) 7.9(L)  Hematocrit 39.0 - 52.0 % 26.3(L) 26.1(L) 27.6(L)  Platelets 150 - 400 K/uL 153 168 170    CMP Latest Ref Rng & Units 12/28/2018 12/27/2018 12/26/2018  Glucose 70 - 99 mg/dL 123(H) 116(H) 114(H)  BUN 8 - 23 mg/dL 35(H) 51(H) 77(H)  Creatinine 0.61 - 1.24 mg/dL 1.35(H) 1.57(H) 2.20(H)  Sodium 135 - 145 mmol/L 136 138 139  Potassium 3.5 - 5.1 mmol/L 3.7 3.9 4.3  Chloride 98 - 111 mmol/L 108 109 109  CO2 22 - 32 mmol/L 20(L) 18(L) 19(L)  Calcium 8.9 - 10.3 mg/dL 8.3(L) 8.4(L) 8.4(L)  Total Protein 6.5 - 8.1 g/dL - - -  Total Bilirubin 0.3 - 1.2 mg/dL - - -  Alkaline Phos 38 - 126 U/L - - -  AST 15 - 41 U/L - - -  ALT 0 - 44 U/L - - -    Hepatic Function Latest Ref Rng & Units 12/25/2018 09/03/2018 05/02/2018  Total Protein 6.5 - 8.1 g/dL 6.4(L) 6.7 6.8  Albumin 3.5 - 5.0 g/dL 3.1(L) 3.9 3.9  AST 15 - 41 U/L 12(L) 19 17  ALT 0 - 44  U/L 10 11 17   Alk Phosphatase 38 - 126 U/L 54 79 66  Total Bilirubin 0.3 - 1.2 mg/dL 1.0 0.5 0.4  Bilirubin, Direct 0.0 - 0.3 mg/dL - - -    INR1.7 Serum iron 17 TIBC 380 and saturation 4% Serum ferritin 35 Folate level is 6. B12 392.  Assessment:  #1.  Iron deficiency anemia.  Hemoccults are still pending.  He has history of colon carcinoma his last colonoscopy was September 2014 with removal of single small tubular adenoma.  Given history of anorexia and colon carcinoma he could have peptic ulcer disease GI angiodysplasia or neoplasm.  Patient has received 3 units of PRBC since admission.  Hemoglobin remains low but much better than on presentation. Serum B12 level is normal and folate is low normal most likely due to poor oral intake in the last several weeks.  #2.  Atrial fibrillation.  Patient with controlled rate.  #3.)  Infiltrate felt to be pneumonia.  His cough has improved.  He remains on azithromycin and ceftriaxone.  #4.  Coagulopathy.  His INR was greater than 10 on admission and prothrombin time was greater than 90.  He received prothrombin complex as well as vitamin K.  INR was 1.5 yesterday now  going back up.   Patient deemed to be stable for endoscopic evaluation.   Recommendations  Vitamin K 3 mg subcu now. GoLYTELY prep this morning. Physical gastroduodenoscopy and colonoscopy later today with conscious sedation.

## 2018-12-28 NOTE — Progress Notes (Signed)
PROGRESS NOTE    William Flores  YSA:[id number] DOB: 23-Sep-1950 DOA: 12/25/2018 PCP: [name], [name], MD    Brief Narrative:  68 year old male with a history of hypertension, diabetes, chronic kidney disease stage III, atrial fibrillation, presents with worsening generalized weakness.  Found to have significant anemia with a hemoglobin of 5.1.  Creatinine elevated at 2.3.  INR supratherapeutic at greater than 10 and was reversed with Kcentra.  Chest x-ray indicated possible pneumonia.  He was started on intravenous antibiotics and transfused PRBC.  Admitted for further work-up of anemia including GI evaluation.   Assessment & Plan:   Active Problems:   History of colon cancer   Morbid obesity (HCC)   OSA (obstructive sleep apnea)   Atrial fibrillation with slow ventricular response (HCC)   AKI (acute kidney injury) ([place])   DM (diabetes mellitus), type 2, uncontrolled, with renal complications ([place])   Essential hypertension   CKD stage 3 due to type 2 diabetes mellitus ([place])   Acute anemia   Pulmonary hypertension ([place])   1. Acute blood loss anemia likely related to GI bleeding.  Patient reports noticing dark-colored stools intermittently.  He does have a history of colon cancer in the past which was resected.  He has been transfused a total of 3 units of PRBC.  Gastroenterology consulted.  He underwent EGD/colonoscopy with reports noted below.  Repeat hemoglobin in a.m. 2. Acute respiratory failure with hypoxia secondary to community-acquired pneumonia.  Initial oxygen saturations 85% on room air, which improved after supplemental oxygen was applied.  Continue on intravenous antibiotics.  Wean down oxygen as tolerated. 3. Acute kidney injury.  Baseline creatinine 1.4-1.9.  Creatinine on admission 2.3.  Likely related to hypotension and severe anemia.  Lasix currently on hold.  Overall creatinine has improved to 1.35 today. 4. UTI.  Urine culture positive for staph epidermidis.  On  IV antibiotics 5. History of nonischemic cardiomyopathy.  Chest x-ray shows pulmonary vascular congestion with layering pleural effusion.  Repeat echocardiogram shows normal ejection fraction, but increased pulmonary pressures and right-sided heart failure.  Pulmonary pressures noted to be at 76 mmHg.  Will restart Lasix. 6. Supratherapeutic INR.  Patient was taking warfarin.  This was reversed with Kcentra.  Continue to follow. 7. Chronic atrial fibrillation.  Currently on home dose of metoprolol.  Heart rate is stable.  Will restart warfarin tomorrow 8. Diabetes.  Continue to follow blood sugars.  Holding home dose of Trulicity, Metformin, [place].  Continue on sliding scale insulin.  Blood sugars currently stable.   DVT prophylaxis: SCDs Code Status: Full code Family Communication: None present Disposition Plan: Discharge home once improved, will request physical therapy evaluation   Consultants:   Gastroenterology  Procedures:  Colonoscopy:- Preparation of the colon was fair.                           - The examined portion of the ileum was normal.                           - One small polyp at the hepatic flexure. Biopsied.                           - One 4 mm polyp at the hepatic flexure, removed  with a cold snare. Complete resection. Polyp tissue                            not retrieved.                           - Seven 7 to 16 mm polyps at the splenic flexure                            and in the transverse colon, removed with a hot                            snare. Complete resection. Partial retrieval. Clips                            (MR conditional) were placed.                           - Two polyps not removed becuase of                             hyperperistalsis.  EGD: - Normal esophagus.                           - Z-line regular, 45 cm from the incisors.                           - Bilious gastric fluid. Fluid aspiration  performed.                           - Nodular mucosa in the gastric body. Biopsied.                           - Normal duodenal bulb and second portion of the                            duodenum.  Antimicrobials:   Azithromycin 12/22 >  Ceftriaxone 12/22>   Subjective: Patient seen in room after his colonoscopy.  Denies any pain or shortness of breath at this time.  Objective: Vitals:   12/28/18 1600 12/28/18 1605 12/28/18 1610 12/28/18 1900  BP: (!) 127/96 121/67 131/73 (!) 104/56  Pulse: 91 92 (!) 101 (!) 103  Resp: 16 19 17 17   Temp:      TempSrc:      SpO2: 95% (!) 89% 93% 90%  Weight:      Height:        Intake/Output Summary (Last 24 hours) at 12/28/2018 2009 Last data filed at 12/28/2018 0400 Gross per 24 hour  Intake 2106.72 ml  Output 100 ml  Net 2006.72 ml   Filed Weights   12/25/18 2057 12/26/18 1750 12/27/18 0500  Weight: 113.6 kg 108.8 kg 109.7 kg    Examination:  General exam: Alert, awake, oriented x 3 Respiratory system: Clear to auscultation. Respiratory effort normal. Cardiovascular system:RRR. No murmurs, rubs, gallops. Gastrointestinal system: Abdomen is nondistended, soft and nontender. No organomegaly or masses felt. Normal bowel sounds heard. Central  nervous system: Alert and oriented. No focal neurological deficits. Extremities: No C/C/E, +pedal pulses Skin: No rashes, lesions or ulcers Psychiatry: Judgement and insight appear normal. Mood & affect appropriate.     Data Reviewed: I have personally reviewed following labs and imaging studies  CBC: Recent Labs  Lab 12/25/18 1902 12/26/18 0355 12/26/18 2027 12/27/18 0416 12/28/18 0524  WBC 7.6 7.0 7.9 7.1 5.8  HGB 5.1* 6.5* 7.9* 7.5* 7.3*  HCT 19.5* 23.2* 27.6* 26.1* 26.3*  MCV 84.8 87.2 87.3 86.4 88.3  PLT 184 165 170 168 854   Basic Metabolic Panel: Recent Labs  Lab 12/25/18 1902 12/26/18 0355 12/27/18 0416 12/28/18 0524  NA 137 139 138 136  K 4.7 4.3 3.9 3.7  CL  109 109 109 108  CO2 19* 19* 18* 20*  GLUCOSE 143* 114* 116* 123*  BUN 78* 77* 51* 35*  CREATININE 2.31* 2.20* 1.57* 1.35*  CALCIUM 8.4* 8.4* 8.4* 8.3*   GFR: Estimated Creatinine Clearance: 60.4 mL/min (A) (by C-G formula based on SCr of 1.35 mg/dL (H)). Liver Function Tests: Recent Labs  Lab 12/25/18 1902  AST 12*  ALT 10  ALKPHOS 54  BILITOT 1.0  PROT 6.4*  ALBUMIN 3.1*   No results for input(s): LIPASE, AMYLASE in the last 168 hours. No results for input(s): AMMONIA in the last 168 hours. Coagulation Profile: Recent Labs  Lab 12/25/18 1902 12/26/18 0355 12/27/18 0416 12/28/18 0524  INR >10.0* 1.9* 1.5* 1.7*   Cardiac Enzymes: No results for input(s): CKTOTAL, CKMB, CKMBINDEX, TROPONINI in the last 168 hours. BNP (last 3 results) No results for input(s): PROBNP in the last 8760 hours. HbA1C: No results for input(s): HGBA1C in the last 72 hours. CBG: Recent Labs  Lab 12/27/18 2208 12/28/18 0414 12/28/18 0742 12/28/18 1143 12/28/18 1701  GLUCAP 140* 118* 117* 182* 123*   Lipid Profile: No results for input(s): CHOL, HDL, LDLCALC, TRIG, CHOLHDL, LDLDIRECT in the last 72 hours. Thyroid Function Tests: No results for input(s): TSH, T4TOTAL, FREET4, T3FREE, THYROIDAB in the last 72 hours. Anemia Panel: Recent Labs    12/25/18 2100 12/26/18 2027  VITAMINB12 392  --   FOLATE  --  6.0  FERRITIN 35  --   TIBC 380  --   IRON 17*  --    Sepsis Labs: Recent Labs  Lab 12/25/18 1902  LATICACIDVEN 1.8    Recent Results (from the past 240 hour(s))  Culture, Urine     Status: Abnormal   Collection Time: 12/25/18  6:20 PM   Specimen: Urine, Random  Result Value Ref Range Status   Specimen Description   Final    URINE, RANDOM Performed at [facility], [address] [place], [place] [address]    Special Requests   Final    NONE Performed at [facility], [address], [place], [place] [address]    Culture >=100,000 COLONIES/mL STAPHYLOCOCCUS  EPIDERMIDIS (A)  Final   Report Status 12/28/2018 FINAL  Final   Organism ID, Bacteria STAPHYLOCOCCUS EPIDERMIDIS (A)  Final      Susceptibility   Staphylococcus epidermidis - MIC*    CIPROFLOXACIN <=0.5 SENSITIVE Sensitive     GENTAMICIN <=0.5 SENSITIVE Sensitive     NITROFURANTOIN <=16 SENSITIVE Sensitive     OXACILLIN <=0.25 SENSITIVE Sensitive     TETRACYCLINE 2 SENSITIVE Sensitive     VANCOMYCIN 1 SENSITIVE Sensitive     TRIMETH/SULFA <=10 SENSITIVE Sensitive     CLINDAMYCIN <=0.25 SENSITIVE Sensitive     RIFAMPIN <=0.5  SENSITIVE Sensitive     Inducible Clindamycin NEGATIVE Sensitive     * >=100,000 COLONIES/mL STAPHYLOCOCCUS EPIDERMIDIS  Blood Culture x 1     Status: None (Preliminary result)   Collection Time: 12/25/18  7:03 PM   Specimen: BLOOD  Result Value Ref Range Status   Specimen Description BLOOD LEFT ANTECUBITAL  Final   Special Requests   Final    BOTTLES DRAWN AEROBIC AND ANAEROBIC Blood Culture adequate volume   Culture   Final    NO GROWTH 3 DAYS Performed at [facility], [address] [place], [place] [address]    Report Status PENDING  Incomplete  SARS CORONAVIRUS 2 (TAT 6-24 HRS) Nasopharyngeal Nasopharyngeal Swab     Status: None   Collection Time: 12/25/18  8:32 PM   Specimen: Nasopharyngeal Swab  Result Value Ref Range Status   SARS Coronavirus 2 NEGATIVE NEGATIVE Final    Comment: (NOTE) SARS-CoV-2 target nucleic acids are NOT DETECTED. The SARS-CoV-2 RNA is generally detectable in upper and lower respiratory specimens during the acute phase of infection. Negative results do not preclude SARS-CoV-2 infection, do not rule out co-infections with other pathogens, and should not be used as the sole basis for treatment or other patient management decisions. Negative results must be combined with clinical observations, patient history, and epidemiological information. The expected result is Negative. Fact Sheet for Patients:  SugarRoll.be Fact Sheet for Healthcare Providers: https://www.woods-mathews.com/ This test is not yet approved or cleared by the [place] FDA and  has been authorized for detection and/or diagnosis of SARS-CoV-2 by FDA under an Emergency Use Authorization (EUA). This EUA will remain  in effect (meaning this test can be used) for the duration of the COVID-19 declaration under Section 56 4(b)(1) of the Act, 21 U.S.C. section 360bbb-3(b)(1), unless the authorization is terminated or revoked sooner. Performed at [facility], [place] [address] [place], [place] [address]   MRSA PCR Screening     Status: None   Collection Time: 12/26/18  4:47 PM   Specimen: Nasopharyngeal  Result Value Ref Range Status   MRSA by PCR NEGATIVE NEGATIVE Final    Comment:        The GeneXpert MRSA Assay (FDA approved for NASAL specimens only), is one component of a comprehensive MRSA colonization surveillance program. It is not intended to diagnose MRSA infection nor to guide or monitor treatment for MRSA infections. Performed at [facility], [address] [place], [place] [address]          Radiology Studies: ECHOCARDIOGRAM COMPLETE  Result Date: 12/28/2018   ECHOCARDIOGRAM REPORT   Patient Name:   William Flores Date of Exam: 12/28/2018 Medical Rec #:  [id number]      Height:       64.0 in Accession #:    [id number]     Weight:       241.8 lb Date of Birth:  05-22-1950      BSA:          2.12 m Patient Age:    43 years       BP:           139/81 mmHg Patient Gender: M              HR:           101 bpm. Exam Location:  Inpatient Procedure: 2D Echo and Intracardiac Opacification Agent Indications:    Atrial Fibrillation 427.31 / I48.91  History:  Patient has prior history of Echocardiogram examinations, most                 recent 05/16/2014. Risk Factors:Hypertension, Diabetes and Sleep                 Apnea. Chronic kidney disease, Acute anemia,  pneumonia.  Sonographer:    Darlina Sicilian RDCS Referring Phys: [place]  1. Left ventricular ejection fraction, by visual estimation, is 50 to 55%. The left ventricle has normal function. There is moderately increased left ventricular hypertrophy.  2. Definity contrast agent was given IV to delineate the left ventricular endocardial borders.  3. The left ventricle has no regional wall motion abnormalities.  4. Global right ventricle has moderately reduced systolic function.The right ventricular size is severely enlarged. Right vetricular wall thickness was not assessed.  5. Left atrial size was normal.  6. Right atrial size was severely dilated.  7. Moderate mitral annular calcification.  8. The mitral valve is degenerative. Mild mitral valve regurgitation.  9. The tricuspid valve is grossly normal. 10. The aortic valve is tricuspid. Aortic valve regurgitation is not visualized. 11. The pulmonic valve was not well visualized. Pulmonic valve regurgitation is not visualized. 12. Severely elevated pulmonary artery systolic pressure. 13. The inferior vena cava is dilated in size with <50% respiratory variability, suggesting right atrial pressure of 15 mmHg. FINDINGS  Left Ventricle: Left ventricular ejection fraction, by visual estimation, is 50 to 55%. The left ventricle has normal function. Definity contrast agent was given IV to delineate the left ventricular endocardial borders. The left ventricle has no regional wall motion abnormalities. There is moderately increased left ventricular hypertrophy. Concentric left ventricular hypertrophy. Right Ventricle: The right ventricular size is severely enlarged. Right vetricular wall thickness was not assessed. Global RV systolic function is has moderately reduced systolic function. The tricuspid regurgitant velocity is 3.93 m/s, and with an assumed right atrial pressure of 15 mmHg, the estimated right ventricular systolic pressure is severely  elevated at 76.7 mmHg. Left Atrium: Left atrial size was normal in size. Right Atrium: Right atrial size was severely dilated Pericardium: There is no evidence of pericardial effusion. Mitral Valve: The mitral valve is degenerative in appearance. There is mild thickening of the mitral valve leaflet(s). Moderate mitral annular calcification. Mild mitral valve regurgitation. Tricuspid Valve: The tricuspid valve is grossly normal. Tricuspid valve regurgitation moderate. Aortic Valve: The aortic valve is tricuspid. . There is mild thickening of the aortic valve. Aortic valve regurgitation is not visualized. Moderate aortic valve annular calcification. There is mild thickening of the aortic valve. Pulmonic Valve: The pulmonic valve was not well visualized. Pulmonic valve regurgitation is not visualized. Pulmonic regurgitation is not visualized. Aorta: The aortic root is normal in size and structure. Venous: The inferior vena cava is dilated in size with less than 50% respiratory variability, suggesting right atrial pressure of 15 mmHg. IAS/Shunts: No atrial level shunt detected by color flow Doppler.  LEFT VENTRICLE PLAX 2D LVIDd:         6.38 cm LVIDs:         4.05 cm LV PW:         1.29 cm LV IVS:        1.37 cm LV SV:         135 ml LV SV Index:   59.09  LV Volumes (MOD) LV area d, A2C:    25.10 cm LV area d, A4C:    32.70 cm LV area s,  A2C:    15.70 cm LV area s, A4C:    20.10 cm LV major d, A2C:   6.98 cm LV major d, A4C:   7.19 cm LV major s, A2C:   6.05 cm LV major s, A4C:   6.57 cm LV vol d, MOD A2C: 74.4 ml LV vol d, MOD A4C: 122.0 ml LV vol s, MOD A2C: 34.8 ml LV vol s, MOD A4C: 50.6 ml LV SV MOD A2C:     39.6 ml LV SV MOD A4C:     122.0 ml LV SV MOD BP:      52.9 ml LEFT ATRIUM         Index      RIGHT ATRIUM           Index LA diam:    5.10 cm 2.40 cm/m RA Area:     28.30 cm                                RA Volume:   98.90 ml  46.63 ml/m  AORTIC VALVE LVOT Vmax:   111.00 cm/s LVOT Vmean:  78.900 cm/s  LVOT VTI:    0.211 m  AORTA Ao Root diam: 3.10 cm MITRAL VALVE                        TRICUSPID VALVE MV Area (PHT): 3.60 cm             TR Peak grad:   61.7 mmHg MV PHT:        61.19 msec           TR Vmax:        507.00 cm/s MV Decel Time: 211 msec MV E velocity: 132.00 cm/s 103 cm/s SHUNTS                                     Systemic VTI: 0.21 m  Kate Sable MD Electronically signed by Kate Sable MD Signature Date/Time: 12/28/2018/11:09:18 AM    Final         Scheduled Meds: . allopurinol  300 mg Oral Daily  . atorvastatin  20 mg Oral q1800  . Chlorhexidine Gluconate Cloth  6 each Topical Daily  . insulin aspart  0-15 Units Subcutaneous TID WC & HS  . lidocaine      . mouth rinse  15 mL Mouth Rinse BID  . metoCLOPramide  5 mg Oral TID AC  . metoprolol tartrate  50 mg Oral BID  . midazolam      . pantoprazole (PROTONIX) IV  40 mg Intravenous QHS   Continuous Infusions: . sodium chloride 20 mL/hr at 12/28/18 1659  . azithromycin 500 mg (12/28/18 1848)  . cefTRIAXone (ROCEPHIN)  IV 2 g (12/28/18 1716)     LOS: 3 days    Time spent: 57mns    JKathie Dike MD [facility]   If 7PM-7AM, please contact night-coverage [email or web address]  12/28/2018, 8:09 PM

## 2018-12-28 NOTE — Progress Notes (Signed)
Disregard nursing progress note at 01:24 12/28/2018. Entered in error.

## 2018-12-28 NOTE — Op Note (Addendum)
[facility] Patient Name: William Flores Procedure Date: 12/28/2018 2:50 PM MRN: [id number] Date of Birth: April 26, 1950 Attending MD: Hildred Laser , MD CSN: [id number] Age: 68 Admit Type: Inpatient Procedure:                Colonoscopy Indications:              Iron deficiency anemia, Personal history of                            malignant neoplasm of the colon Providers:                Hildred Laser, MD, Lurline Del, RN, Nelda Severe, RN Referring MD:             Kathie Dike, MD Medicines:                None Complications:            No immediate complications. Estimated Blood Loss:     Estimated blood loss was minimal. Procedure:                Pre-Anesthesia Assessment:                           - Prior to the procedure, a History and Physical                            was performed, and patient medications and                            allergies were reviewed. The patient's tolerance of                            previous anesthesia was also reviewed. The risks                            and benefits of the procedure and the sedation                            options and risks were discussed with the patient.                            All questions were answered, and informed consent                            was obtained. Prior Anticoagulants: The patient                            last took Coumadin (warfarin) 4 days prior to the                            procedure. ASA Grade Assessment: III - A patient                            with severe systemic disease. After reviewing the  risks and benefits, the patient was deemed in                            satisfactory condition to undergo the procedure.                           After obtaining informed consent, the colonoscope                            was passed under direct vision. Throughout the                            procedure, the patient's blood pressure, pulse, and                             oxygen saturations were monitored continuously. The                            [id number]-[id number] ([id number]) scope was introduced through                            the anus and advanced to the 8 cm into the ileum                            past ileo-colonic anastamosis. The colonoscopy was                            technically difficult and complex due to inadequate                            bowel prep. The patient tolerated the procedure                            well. The quality of the bowel preparation was                            fair. The terminal ileum and the rectum were                            photographed. Scope In: 2:56:51 PM Scope Out: 3:59:30 PM Scope Withdrawal Time: 0 hours 57 minutes 18 seconds  Total Procedure Duration: 1 hour 2 minutes 39 seconds  Findings:      The perianal and digital rectal examinations were normal.      The neo-terminal ileum appeared normal.      A small polyp was found in the hepatic flexure. The polyp was sessile.       Biopsies were taken with a cold forceps for histology. The pathology       specimen was placed into Bottle Number 2.      A 4 mm polyp was found in the hepatic flexure. The polyp was removed       with a cold snare. Resection was complete, but the polyp tissue was not       retrieved.      Seven multi-lobulated and pedunculated and  semi-pedunculated polyps were       found in the splenic flexure and transverse colon. The polyps were 7 to       16 mm in size. These polyps were removed with a hot snare. Resection was       complete, but the polyp tissue was only partially retrieved. The       pathology specimen was placed into Bottle Number 2. To prevent bleeding       after the polypectomy, two hemostatic clips were successfully placed (MR       conditional). There was no bleeding during, or at the end, of the       procedure.      The retroflexed view of the distal rectum and anal verge was normal and       showed no  anal or rectal abnormalities. Impression:               - Preparation of the colon was fair.                           - The examined portion of the ileum was normal.                           - One small polyp at the hepatic flexure. Biopsied.                           - One 4 mm polyp at the hepatic flexure, removed                            with a cold snare. Complete resection. Polyp tissue                            not retrieved.                           - Seven 7 to 16 mm polyps at the splenic flexure                            and in the transverse colon, removed with a hot                            snare. Complete resection. Partial retrieval. Clips                            (MR conditional) were placed.                           - Two polyps not removed becuase of                            hyperperistalsis. Moderate Sedation:      Moderate (conscious) sedation was administered by the endoscopy nurse       and supervised by the endoscopist. The following parameters were       monitored: oxygen saturation, heart rate, blood pressure, CO2       capnography and response to care. Total physician intraservice time was  83 minutes. Recommendation:           - Return patient to ICU for ongoing care.                           - Diabetic (ADA) diet today.                           - Continue present medications.                           - Await pathology results.                           - Repeat colonoscopy in 6 months.                           - Can resume wasrfarin tomorrow. Procedure Code(s):        --- Professional ---                           [phone], Colonoscopy, flexible; with removal of                            tumor(s), polyp(s), or other lesion(s) by snare                            technique                           [id number], 44, Colonoscopy, flexible; with biopsy,                            single or multiple Diagnosis Code(s):        --- Professional ---                            K63.5, Polyp of colon                           D50.9, Iron deficiency anemia, unspecified                           Z85.038, Personal history of other malignant                            neoplasm of large intestine CPT copyright 2019 American Medical Association. All rights reserved. The codes documented in this report are preliminary and upon coder review may  be revised to meet current compliance requirements. Hildred Laser, MD Hildred Laser, MD 12/28/2018 4:32:16 PM This report has been signed electronically. Number of Addenda: 0

## 2018-12-29 LAB — GLUCOSE, CAPILLARY
Glucose-Capillary: 124 mg/dL — ABNORMAL HIGH (ref 70–99)
Glucose-Capillary: 150 mg/dL — ABNORMAL HIGH (ref 70–99)
Glucose-Capillary: 170 mg/dL — ABNORMAL HIGH (ref 70–99)
Glucose-Capillary: 150 mg/dL — ABNORMAL HIGH (ref 70–99)

## 2018-12-29 LAB — BASIC METABOLIC PANEL
Anion gap: 6 (ref 5–15)
BUN: 29 mg/dL — ABNORMAL HIGH (ref 8–23)
CO2: 21 mmol/L — ABNORMAL LOW (ref 22–32)
Calcium: 8.3 mg/dL — ABNORMAL LOW (ref 8.9–10.3)
Chloride: 109 mmol/L (ref 98–111)
Creatinine, Ser: 1.4 mg/dL — ABNORMAL HIGH (ref 0.61–1.24)
GFR calc Af Amer: >60 mL/min (ref >60)
GFR calc non Af Amer: 52 mL/min — ABNORMAL LOW (ref >60)
Glucose, Bld: 136 mg/dL — ABNORMAL HIGH (ref 70–99)
Potassium: 4.2 mmol/L (ref 3.5–5.1)
Sodium: 136 mmol/L (ref 135–145)

## 2018-12-29 LAB — CBC
HCT: 26.6 % — ABNORMAL LOW (ref 39.0–52.0)
Hemoglobin: 7.4 g/dL — ABNORMAL LOW (ref 13.0–17.0)
MCH: 25.2 pg — ABNORMAL LOW (ref 26.0–34.0)
MCHC: 27.8 g/dL — ABNORMAL LOW (ref 30.0–36.0)
MCV: 90.5 fL (ref 80.0–100.0)
Platelets: 147 10*3/uL — ABNORMAL LOW (ref 150–400)
RBC: 2.94 MIL/uL — ABNORMAL LOW (ref 4.22–5.81)
RDW: 20.8 % — ABNORMAL HIGH (ref 11.5–15.5)
WBC: 4.5 10*3/uL (ref 4.0–10.5)
nRBC: 0.7 % — ABNORMAL HIGH (ref 0.0–0.2)

## 2018-12-29 MED ORDER — WARFARIN - PHARMACIST DOSING INPATIENT: Freq: Every day | Status: DC

## 2018-12-29 MED ORDER — WARFARIN SODIUM 2 MG PO TABS
2.0000 mg | ORAL_TABLET | Freq: Once | ORAL | Status: AC
  Administered 2018-12-29: 2 mg via ORAL
  Filled 2018-12-29: qty 1

## 2018-12-29 MED ORDER — SODIUM CHLORIDE 0.9 % IV SOLN
510.0000 mg | Freq: Once | INTRAVENOUS | Status: AC
  Administered 2018-12-29: 510 mg via INTRAVENOUS
  Filled 2018-12-29: qty 17

## 2018-12-29 NOTE — Progress Notes (Signed)
ANTICOAGULATION CONSULT NOTE - Initial Up Consult   Pharmacy Consult for warfarin dosing  Indication: atrial fibrillation   Allergies  Allergen Reactions  . Codeine     Headache       Patient Measurements: Last Weight  Most recent update: 12/27/2018  7:17 AM   Weight  109.7 kg (241 lb 13.5 oz)           Body mass index is 41.51 kg/m. William Flores               Temp: 97.5 F (36.4 C) (12/26 0800) Temp Source: Axillary (12/26 0800) BP: 105/75 (12/26 0900) Pulse Rate: 79 (12/26 0900)  Labs: Recent Labs    12/27/18 0416 12/28/18 0524 12/29/18 0358  HGB 7.5* 7.3* 7.4*  HCT 26.1* 26.3* 26.6*  PLT 168 153 147*  LABPROT 17.8* 20.1*  --   INR 1.5* 1.7*  --   CREATININE 1.57* 1.35* 1.40*    Estimated Creatinine Clearance: 58.3 mL/min (A) (by C-G formula based on SCr of 1.4 mg/dL (H)).     Medications:  Medications Prior to Admission  Medication Sig Dispense Refill Last Dose  . allopurinol (ZYLOPRIM) 300 MG tablet TAKE 1 TABLET BY MOUTH EVERY DAY (Patient taking differently: Take 300 mg by mouth daily. ) 90 tablet 1 12/25/2018 at Unknown time  . atorvastatin (LIPITOR) 20 MG tablet 1 tablet daily (Patient taking differently: Take 20 mg by mouth daily. ) 90 tablet 3 12/24/2018 at Unknown time  . azelastine (OPTIVAR) 0.05 % ophthalmic solution PLACE 2 DROPS INTO BOTH EYES 2 (TWO) TIMES DAILY. (Patient taking differently: Place 2 drops into the right eye 2 (two) times daily. ) 6 mL 5 12/25/2018 at Unknown time  . dapagliflozin propanediol (FARXIGA) 5 MG TABS tablet Take 5 mg by mouth daily. 90 tablet 3 12/25/2018 at Unknown time  . diltiazem (CARDIZEM CD) 180 MG 24 hr capsule TAKE 1 CAPSULE BY MOUTH EVERY DAY (Patient taking differently: Take 180 mg by mouth daily. ) 90 capsule 0 12/25/2018 at Unknown time  . furosemide (LASIX) 80 MG tablet TAKE 1 TABLET BY MOUTH IN MORNING AND 1/2 TABLET IN THE EVENING (Patient taking differently: Take 40-80 mg by mouth See admin  instructions. TAKE 1 TABLET BY MOUTH IN MORNING AND 1/2 TABLET IN THE EVENING) 135 tablet 0 12/25/2018 at Unknown time  . losartan (COZAAR) 100 MG tablet Take 100 mg by mouth daily.   12/25/2018 at Unknown time  . metFORMIN (GLUCOPHAGE) 1000 MG tablet TAKE 0.5 TABLETS (500 MG TOTAL) BY MOUTH 2 (TWO) TIMES DAILY WITH A MEAL. 90 tablet 0 12/25/2018 at Unknown time  . metoprolol tartrate (LOPRESSOR) 50 MG tablet Take 1 tablet (50 mg total) by mouth 2 (two) times daily. 180 tablet 3 12/25/2018 at morning  . omeprazole (PRILOSEC) 20 MG capsule Take 1 capsule (20 mg total) by mouth daily. 90 capsule 3 12/25/2018 at Unknown time  . potassium chloride SA (KLOR-CON M20) 20 MEQ tablet TAKE 1/2 TABLET (10 MEQ TOTAL) BY MOUTH DAILY. NEED OV. *INS WILL COVER 4/19 OR LATER* (Patient taking differently: Take 10 mEq by mouth daily. ) 45 tablet 1 12/25/2018 at Unknown time  . TRESIBA FLEXTOUCH 100 UNIT/ML SOPN FlexTouch Pen INJECT 0.26 MLS (26 UNITS TOTAL) INTO THE SKIN DAILY. (Patient taking differently: 26 Units daily. ) 15 pen 1 12/25/2018 at Unknown time  . TRULICITY 1.5 IO/9.7DZ SOPN INJECT 1.5 MG INTO THE SKIN ONCE A WEEK. 6 mL 0 Past Week at Unknown time  .  warfarin (COUMADIN) 5 MG tablet TAKE AS DIRECTED (Patient taking differently: Take 5 mg by mouth See admin instructions. 1 tablet on Tuesday and Thursday and 1 and 1/2 tablet on Monday, Wednesday Friday, Saturday and Sunday) 180 tablet 0 12/25/2018 at 0900  . colchicine 0.6 MG tablet Take 2 tablets st first sign of gout flare. May take an additional tablet in 1 hour if needed.  Max of 3 tablets in 24 hours. (Patient not taking: Reported on 12/26/2018) 10 tablet 1 Not Taking at Unknown time  . Insulin Pen Needle 32G X 4 MM MISC Use to give insulin daily Dx E11.29 (Patient not taking: Reported on 12/25/2018) 100 each 3 Not Taking at Unknown time  . polyvinyl alcohol (LIQUIFILM TEARS) 1.4 % ophthalmic solution Place 1 drop into both eyes as needed for dry eyes.  (Patient not taking: Reported on 12/26/2018) 15 mL 0 Not Taking at Unknown time   Scheduled:  . allopurinol  300 mg Oral Daily  . atorvastatin  20 mg Oral q1800  . Chlorhexidine Gluconate Cloth  6 each Topical Daily  . furosemide  80 mg Oral Daily  . insulin aspart  0-15 Units Subcutaneous TID WC & HS  . mouth rinse  15 mL Mouth Rinse BID  . metoCLOPramide  5 mg Oral TID AC  . metoprolol tartrate  50 mg Oral BID  . pantoprazole (PROTONIX) IV  40 mg Intravenous QHS  . warfarin  2 mg Oral ONCE-1800  . Warfarin - Pharmacist Dosing Inpatient   Does not apply q1800   Infusions:  . sodium chloride 20 mL/hr at 12/28/18 1659  . azithromycin 500 mg (12/28/18 1848)  . cefTRIAXone (ROCEPHIN)  IV 2 g (12/28/18 1716)  . ferumoxytol     PRN: acetaminophen **OR** acetaminophen, ondansetron **OR** ondansetron (ZOFRAN) IV, polyethylene glycol Anti-infectives (From admission, onward)   Start     Dose/Rate Route Frequency Ordered Stop   12/26/18 1900  cefTRIAXone (ROCEPHIN) 2 g in sodium chloride 0.9 % 100 mL IVPB     2 g 200 mL/hr over 30 Minutes Intravenous Every 24 hours 12/25/18 2317     12/26/18 1900  azithromycin (ZITHROMAX) 500 mg in sodium chloride 0.9 % 250 mL IVPB  Status:  Discontinued     500 mg 250 mL/hr over 60 Minutes Intravenous Every 24 hours 12/25/18 2317 12/25/18 2319   12/25/18 1915  cefTRIAXone (ROCEPHIN) 2 g in sodium chloride 0.9 % 100 mL IVPB     2 g 200 mL/hr over 30 Minutes Intravenous  Once 12/25/18 1913 12/25/18 2010   12/25/18 1915  azithromycin (ZITHROMAX) 500 mg in sodium chloride 0.9 % 250 mL IVPB     50 0 mg 250 mL/hr over 60 Minutes Intravenous Every 24 hours 12/25/18 1913        Goal of Therapy:  INR 2-3 Monitor platelets by anticoagulation protocol: Yes    Prior to Admission Warfarin Dosing:  William Flores takes 64m and 7.536mpo alternating days prior to admission.    Admit INR was > 10  Lab Results  Component Value Date   INR 1.7 (H) 12/28/2018    INR 1.5 (H) 12/27/2018   INR 1.9 (H) 12/26/2018    Assessment: William Flores [age].o. male requires anticoagulation with warfarin for the indication of  atrial fibrillation. INR was >10 upon admission, William Flores gotten 1048mf vitamin k twice, and 3mg30m/15. Warfarin will be initiated inpatient following pharmacy protocol per pharmacy consult. Patient most  recent blood work is as follows: CBC Latest Ref Rng & Units 12/29/2018 12/28/2018 12/27/2018  WBC 4.0 - 10.5 K/uL 4.5 5.8 7.1  Hemoglobin 13.0 - 17.0 g/dL 7.4(L) 7.3(L) 7.5(L)  Hematocrit 39.0 - 52.0 % 26.6(L) 26.3(L) 26.1(L)  Platelets 150 - 400 K/uL 147(L) 153 168     Plan: Warfarin 103m po x 1 Monitor CBC daily with am labs   Monitor INR daily Monitor for signs and symptoms of bleeding   GDonna ChristenCoffee, PharmD, MBA, BCGP Clinical Pharmacist

## 2018-12-29 NOTE — Progress Notes (Signed)
PROGRESS NOTE    William Flores  NGE:[id number] DOB: 08/07/1950 DOA: 12/25/2018 PCP: [name], [name], MD    Brief Narrative:  68 year old male with a history of hypertension, diabetes, chronic kidney disease stage III, atrial fibrillation, presents with worsening generalized weakness.  Found to have significant anemia with a hemoglobin of 5.1.  Creatinine elevated at 2.3.  INR supratherapeutic at greater than 10 and was reversed with Kcentra.  Chest x-ray indicated possible pneumonia.  He was started on intravenous antibiotics and transfused PRBC.  Admitted for further work-up of anemia including GI evaluation.   Assessment & Plan:   Active Problems:   History of colon cancer   Morbid obesity (HCC)   OSA (obstructive sleep apnea)   Atrial fibrillation with slow ventricular response (HCC)   AKI (acute kidney injury) ([place])   DM (diabetes mellitus), type 2, uncontrolled, with renal complications ([place])   Essential hypertension   CKD stage 3 due to type 2 diabetes mellitus ([place])   Acute anemia   Pulmonary hypertension ([place])   1. Acute blood loss anemia likely related to GI bleeding.  Patient reports noticing dark-colored stools intermittently.  He does have a history of colon cancer in the past which was resected.  He has been transfused a total of 3 units of PRBC.  Gastroenterology consulted.  He underwent EGD/colonoscopy with reports noted below.  Repeat hemoglobin in a.m. Since iron panel indicates iron deficiency, will give iron infusion. 2. Acute respiratory failure with hypoxia secondary to community-acquired pneumonia.  Initial oxygen saturations 85% on room air, which improved after supplemental oxygen was applied.  Continue on intravenous antibiotics.  Wean down oxygen as tolerated. 3. Acute kidney injury.  Baseline creatinine 1.4-1.9.  Creatinine on admission 2.3.  Likely related to hypotension and severe anemia.  Creatinine has improved to baseline. 4. UTI.  Urine culture  positive for staph epidermidis.  On IV antibiotics 5. History of nonischemic cardiomyopathy.  Chest x-ray shows pulmonary vascular congestion with layering pleural effusion.  Repeat echocardiogram shows normal ejection fraction, but increased pulmonary pressures and right-sided heart failure.  Pulmonary pressures noted to be at 76 mmHg.  This is likely secondary to his uncontrolled sleep apnea.   6. Supratherapeutic INR.  Patient was taking warfarin.  This was reversed with Kcentra.  Continue to follow. 7. Chronic atrial fibrillation.  Currently on home dose of metoprolol.  Heart rate is stable.  Restart on warfarin today 8. Diabetes.  Continue to follow blood sugars.  Holding home dose of Trulicity, Metformin, [place].  Continue on sliding scale insulin.  Blood sugars currently stable.   DVT prophylaxis: SCDs, warfarin Code Status: Full code Family Communication: Discussed with patient Disposition Plan: Discharge home once improved, physical therapy evaluation pending   Consultants:   Gastroenterology  Procedures:  Colonoscopy:- Preparation of the colon was fair.                           - The examined portion of the ileum was normal.                           - One small polyp at the hepatic flexure. Biopsied.                           - One 4 mm polyp at the hepatic flexure, removed  with a cold snare. Complete resection. Polyp tissue                            not retrieved.                           - Seven 7 to 16 mm polyps at the splenic flexure                            and in the transverse colon, removed with a hot                            snare. Complete resection. Partial retrieval. Clips                            (MR conditional) were placed.                           - Two polyps not removed becuase of                             hyperperistalsis.  EGD: - Normal esophagus.                           - Z-line regular, 45 cm from  the incisors.                           - Bilious gastric fluid. Fluid aspiration performed.                           - Nodular mucosa in the gastric body. Biopsied.                           - Normal duodenal bulb and second portion of the                            duodenum.  Antimicrobials:   Azithromycin 12/22 >  Ceftriaxone 12/22>   Subjective: No nausea or vomiting.  Tolerating diet.  No shortness of breath.  Minimal cough.  Was CPAP last night and felt that he rested better.  Reports that he has a CPAP machine at home that he had been unable to use for the past year since it was not functioning properly.  Objective: Vitals:   12/29/18 1300 12/29/18 1400 12/29/18 1500 12/29/18 1815  BP: 108/90 (!) 98/56 (!) 102/57   Pulse: 92 84 84   Resp: (!) 21 16 16    Temp:    98.5 F (36.9 C)  TempSrc:    Oral  SpO2: 96% 99% 98%   Weight:      Height:        Intake/Output Summary (Last 24 hours) at 12/29/2018 2010 Last data filed at 12/29/2018 1815 Gross per 24 hour  Intake 957 ml  Output 1050 ml  Net -93 ml   Filed Weights   12/25/18 2057 12/26/18 1750 12/27/18 0500  Weight: 113.6 kg 108.8 kg 109.7 kg    Examination:  General exam: Alert,  awake, oriented x 3 Respiratory system: Clear to auscultation. Respiratory effort normal. Cardiovascular system: Irregular. No murmurs, rubs, gallops. Gastrointestinal system: Abdomen is nondistended, soft and nontender. No organomegaly or masses felt. Normal bowel sounds heard. Central nervous system: Alert and oriented. No focal neurological deficits. Extremities: No C/C/E, +pedal pulses Skin: No rashes, lesions or ulcers Psychiatry: Judgement and insight appear normal. Mood & affect appropriate.     Data Reviewed: I have personally reviewed following labs and imaging studies  CBC: Recent Labs  Lab 12/26/18 0355 12/26/18 2027 12/27/18 0416 12/28/18 0524 12/29/18 0358  WBC 7.0 7.9 7.1 5.8 4.5  HGB 6.5* 7.9* 7.5* 7.3*  7.4*  HCT 23.2* 27.6* 26.1* 26.3* 26.6*  MCV 87.2 87.3 86.4 88.3 90.5  PLT 165 170 168 153 220*   Basic Metabolic Panel: Recent Labs  Lab 12/25/18 1902 12/26/18 0355 12/27/18 0416 12/28/18 0524 12/29/18 0358  NA 137 139 138 136 136  K 4.7 4.3 3.9 3.7 4.2  CL 109 109 109 108 109  CO2 19* 19* 18* 20* 21*  GLUCOSE 143* 114* 116* 123* 136*  BUN 78* 77* 51* 35* 29*  CREATININE 2.31* 2.20* 1.57* 1.35* 1.40*  CALCIUM 8.4* 8.4* 8.4* 8.3* 8.3*   GFR: Estimated Creatinine Clearance: 58.3 mL/min (A) (by C-G formula based on SCr of 1.4 mg/dL (H)). Liver Function Tests: Recent Labs  Lab 12/25/18 1902  AST 12*  ALT 10  ALKPHOS 54  BILITOT 1.0  PROT 6.4*  ALBUMIN 3.1*   No results for input(s): LIPASE, AMYLASE in the last 168 hours. No results for input(s): AMMONIA in the last 168 hours. Coagulation Profile: Recent Labs  Lab 12/25/18 1902 12/26/18 0355 12/27/18 0416 12/28/18 0524  INR >10.0* 1.9* 1.5* 1.7*   Cardiac Enzymes: No results for input(s): CKTOTAL, CKMB, CKMBINDEX, TROPONINI in the last 168 hours. BNP (last 3 results) No results for input(s): PROBNP in the last 8760 hours. HbA1C: No results for input(s): HGBA1C in the last 72 hours. CBG: Recent Labs  Lab 12/28/18 1701 12/28/18 2016 12/29/18 0757 12/29/18 1141 12/29/18 1736  GLUCAP 123* 166* 124* 150* 170*   Lipid Profile: No results for input(s): CHOL, HDL, LDLCALC, TRIG, CHOLHDL, LDLDIRECT in the last 72 hours. Thyroid Function Tests: No results for input(s): TSH, T4TOTAL, FREET4, T3FREE, THYROIDAB in the last 72 hours. Anemia Panel: Recent Labs    12/26/18 2027  FOLATE 6.0   Sepsis Labs: Recent Labs  Lab 12/25/18 1902  LATICACIDVEN 1.8    Recent Results (from the past 240 hour(s))  Culture, Urine     Status: Abnormal   Collection Time: 12/25/18  6:20 PM   Specimen: Urine, Random  Result Value Ref Range Status   Specimen Description   Final    URINE, RANDOM Performed at [facility], [address], [place], [place] [address]    Special Requests   Final    NONE Performed at [facility], [address] [place], [place] [address]    Culture >=100,000 COLONIES/mL STAPHYLOCOCCUS EPIDERMIDIS (A)  Final   Report Status 12/28/2018 FINAL  Final   Organism ID, Bacteria STAPHYLOCOCCUS EPIDERMIDIS (A)  Final      Susceptibility   Staphylococcus epidermidis - MIC*    CIPROFLOXACIN <=0.5 SENSITIVE Sensitive     GENTAMICIN <=0.5 SENSITIVE Sensitive     NITROFURANTOIN <=16 SENSITIVE Sensitive     OXACILLIN <=0.25 SENSITIVE Sensitive     TETRACYCLINE 2 SENSITIVE Sensitive     VANCOMYCIN 1 SENSITIVE Sensitive  TRIMETH/SULFA <=10 SENSITIVE Sensitive     CLINDAMYCIN <=0.25 SENSITIVE Sensitive     RIFAMPIN <=0.5 SENSITIVE Sensitive     Inducible Clindamycin NEGATIVE Sensitive     * >=100,000 COLONIES/mL STAPHYLOCOCCUS EPIDERMIDIS  Blood Culture x 1     Status: None (Preliminary result)   Collection Time: 12/25/18  7:03 PM   Specimen: BLOOD  Result Value Ref Range Status   Specimen Description BLOOD LEFT ANTECUBITAL  Final   Special Requests   Final    BOTTLES DRAWN AEROBIC AND ANAEROBIC Blood Culture adequate volume   Culture   Final    NO GROWTH 4 DAYS Performed at [facility], [address], [place], [place] [address]    Report Status PENDING  Incomplete  SARS CORONAVIRUS 2 (TAT 6-24 HRS) Nasopharyngeal Nasopharyngeal Swab     Status: None   Collection Time: 12/25/18  8:32 PM   Specimen: Nasopharyngeal Swab  Result Value Ref Range Status   SARS Coronavirus 2 NEGATIVE NEGATIVE Final    Comment: (NOTE) SARS-CoV-2 target nucleic acids are NOT DETECTED. The SARS-CoV-2 RNA is generally detectable in upper and lower respiratory specimens during the acute phase of infection. Negative results do not preclude SARS-CoV-2 infection, do not rule out co-infections with other pathogens, and should not be used as the sole basis for treatment or other patient management  decisions. Negative results must be combined with clinical observations, patient history, and epidemiological information. The expected result is Negative. Fact Sheet for Patients: SugarRoll.be Fact Sheet for Healthcare Providers: https://www.woods-mathews.com/ This test is not yet approved or cleared by the [place] FDA and  has been authorized for detection and/or diagnosis of SARS-CoV-2 by FDA under an Emergency Use Authorization (EUA). This EUA will remain  in effect (meaning this test can be used) for the duration of the COVID-19 declaration under Section 56 4(b)(1) of the Act, 21 U.S.C. section 360bbb-3(b)(1), unless the authorization is terminated or revoked sooner. Performed at [facility], [place] [address] [place], [place] [address]   MRSA PCR Screening     Status: None   Collection Time: 12/26/18  4:47 PM   Specimen: Nasopharyngeal  Result Value Ref Range Status   MRSA by PCR NEGATIVE NEGATIVE Final    Comment:        The GeneXpert MRSA Assay (FDA approved for NASAL specimens only), is one component of a comprehensive MRSA colonization surveillance program. It is not intended to diagnose MRSA infection nor to guide or monitor treatment for MRSA infections. Performed at [facility], [address] [place], [place] [address]          Radiology Studies: ECHOCARDIOGRAM COMPLETE  Result Date: 12/28/2018   ECHOCARDIOGRAM REPORT   Patient Name:   William Flores Date of Exam: 12/28/2018 Medical Rec #:  [id number]      Height:       64.0 in Accession #:    [id number]     Weight:       241.8 lb Date of Birth:  07/04/1950      BSA:          2.12 m Patient Age:    78 years       BP:           139/81 mmHg Patient Gender: M              HR:           101 bpm. Exam Location:  Inpatient Procedure: 2D Echo and Intracardiac  Opacification Agent Indications:    Atrial Fibrillation 427.31 / I48.91  History:        Patient has prior  history of Echocardiogram examinations, most                 recent 05/16/2014. Risk Factors:Hypertension, Diabetes and Sleep                 Apnea. Chronic kidney disease, Acute anemia, pneumonia.  Sonographer:    Darlina Sicilian RDCS Referring Phys: [place]  1. Left ventricular ejection fraction, by visual estimation, is 50 to 55%. The left ventricle has normal function. There is moderately increased left ventricular hypertrophy.  2. Definity contrast agent was given IV to delineate the left ventricular endocardial borders.  3. The left ventricle has no regional wall motion abnormalities.  4. Global right ventricle has moderately reduced systolic function.The right ventricular size is severely enlarged. Right vetricular wall thickness was not assessed.  5. Left atrial size was normal.  6. Right atrial size was severely dilated.  7. Moderate mitral annular calcification.  8. The mitral valve is degenerative. Mild mitral valve regurgitation.  9. The tricuspid valve is grossly normal. 10. The aortic valve is tricuspid. Aortic valve regurgitation is not visualized. 11. The pulmonic valve was not well visualized. Pulmonic valve regurgitation is not visualized. 12. Severely elevated pulmonary artery systolic pressure. 13. The inferior vena cava is dilated in size with <50% respiratory variability, suggesting right atrial pressure of 15 mmHg. FINDINGS  Left Ventricle: Left ventricular ejection fraction, by visual estimation, is 50 to 55%. The left ventricle has normal function. Definity contrast agent was given IV to delineate the left ventricular endocardial borders. The left ventricle has no regional wall motion abnormalities. There is moderately increased left ventricular hypertrophy. Concentric left ventricular hypertrophy. Right Ventricle: The right ventricular size is severely enlarged. Right vetricular wall thickness was not assessed. Global RV systolic function is has moderately reduced  systolic function. The tricuspid regurgitant velocity is 3.93 m/s, and with an assumed right atrial pressure of 15 mmHg, the estimated right ventricular systolic pressure is severely elevated at 76.7 mmHg. Left Atrium: Left atrial size was normal in size. Right Atrium: Right atrial size was severely dilated Pericardium: There is no evidence of pericardial effusion. Mitral Valve: The mitral valve is degenerative in appearance. There is mild thickening of the mitral valve leaflet(s). Moderate mitral annular calcification. Mild mitral valve regurgitation. Tricuspid Valve: The tricuspid valve is grossly normal. Tricuspid valve regurgitation moderate. Aortic Valve: The aortic valve is tricuspid. . There is mild thickening of the aortic valve. Aortic valve regurgitation is not visualized. Moderate aortic valve annular calcification. There is mild thickening of the aortic valve. Pulmonic Valve: The pulmonic valve was not well visualized. Pulmonic valve regurgitation is not visualized. Pulmonic regurgitation is not visualized. Aorta: The aortic root is normal in size and structure. Venous: The inferior vena cava is dilated in size with less than 50% respiratory variability, suggesting right atrial pressure of 15 mmHg. IAS/Shunts: No atrial level shunt detected by color flow Doppler.  LEFT VENTRICLE PLAX 2D LVIDd:         6.38 cm LVIDs:         4.05 cm LV PW:         1.29 cm LV IVS:        1.37 cm LV SV:         135 ml LV SV Index:   59.09  LV Volumes (MOD) LV  area d, A2C:    25.10 cm LV area d, A4C:    32.70 cm LV area s, A2C:    15.70 cm LV area s, A4C:    20.10 cm LV major d, A2C:   6.98 cm LV major d, A4C:   7.19 cm LV major s, A2C:   6.05 cm LV major s, A4C:   6.57 cm LV vol d, MOD A2C: 74.4 ml LV vol d, MOD A4C: 122.0 ml LV vol s, MOD A2C: 34.8 ml LV vol s, MOD A4C: 50.6 ml LV SV MOD A2C:     39.6 ml LV SV MOD A4C:     122.0 ml LV SV MOD BP:      52.9 ml LEFT ATRIUM         Index      RIGHT ATRIUM           Index  LA diam:    5.10 cm 2.40 cm/m RA Area:     28.30 cm                                RA Volume:   98.90 ml  46.63 ml/m  AORTIC VALVE LVOT Vmax:   111.00 cm/s LVOT Vmean:  78.900 cm/s LVOT VTI:    0.211 m  AORTA Ao Root diam: 3.10 cm MITRAL VALVE                        TRICUSPID VALVE MV Area (PHT): 3.60 cm             TR Peak grad:   61.7 mmHg MV PHT:        61.19 msec           TR Vmax:        507.00 cm/s MV Decel Time: 211 msec MV E velocity: 132.00 cm/s 103 cm/s SHUNTS                                     Systemic VTI: 0.21 m  Kate Sable MD Electronically signed by Kate Sable MD Signature Date/Time: 12/28/2018/11:09:18 AM    Final         Scheduled Meds: . allopurinol  300 mg Oral Daily  . atorvastatin  20 mg Oral q1800  . Chlorhexidine Gluconate Cloth  6 each Topical Daily  . furosemide  80 mg Oral Daily  . insulin aspart  0-15 Units Subcutaneous TID WC & HS  . mouth rinse  15 mL Mouth Rinse BID  . metoCLOPramide  5 mg Oral TID AC  . metoprolol tartrate  50 mg Oral BID  . pantoprazole (PROTONIX) IV  40 mg Intravenous QHS  . Warfarin - Pharmacist Dosing Inpatient   Does not apply q1800   Continuous Infusions: . sodium chloride 20 mL/hr at 12/28/18 1659  . azithromycin 500 mg (12/28/18 1848)  . cefTRIAXone (ROCEPHIN)  IV 2 g (12/28/18 1716)     LOS: 4 days    Time spent: 9mns    JKathie Dike MD [facility]   If 7PM-7AM, please contact night-coverage [email or web address]  12/29/2018, 8:10 PM

## 2018-12-30 LAB — CBC
HCT: 28.2 % — ABNORMAL LOW (ref 39.0–52.0)
Hemoglobin: 7.6 g/dL — ABNORMAL LOW (ref 13.0–17.0)
MCH: 24.8 pg — ABNORMAL LOW (ref 26.0–34.0)
MCHC: 27 g/dL — ABNORMAL LOW (ref 30.0–36.0)
MCV: 92.2 fL (ref 80.0–100.0)
Platelets: 155 10*3/uL (ref 150–400)
RBC: 3.06 MIL/uL — ABNORMAL LOW (ref 4.22–5.81)
RDW: 21 % — ABNORMAL HIGH (ref 11.5–15.5)
WBC: 4.5 10*3/uL (ref 4.0–10.5)
nRBC: 0.4 % — ABNORMAL HIGH (ref 0.0–0.2)

## 2018-12-30 LAB — PROTIME-INR
INR: 2.4 — ABNORMAL HIGH (ref 0.8–1.2)
Prothrombin Time: 26.3 seconds — ABNORMAL HIGH (ref 11.4–15.2)

## 2018-12-30 LAB — GLUCOSE, CAPILLARY
Glucose-Capillary: 146 mg/dL — ABNORMAL HIGH (ref 70–99)
Glucose-Capillary: 174 mg/dL — ABNORMAL HIGH (ref 70–99)
Glucose-Capillary: 138 mg/dL — ABNORMAL HIGH (ref 70–99)
Glucose-Capillary: 166 mg/dL — ABNORMAL HIGH (ref 70–99)

## 2018-12-30 LAB — CULTURE, BLOOD (SINGLE)
Culture: NO GROWTH
Special Requests: ADEQUATE

## 2018-12-30 MED ORDER — WARFARIN SODIUM 1 MG PO TABS
1.0000 mg | ORAL_TABLET | Freq: Once | ORAL | Status: AC
  Administered 2018-12-30: 1 mg via ORAL
  Filled 2018-12-30: qty 1

## 2018-12-30 MED ORDER — WARFARIN SODIUM 2 MG PO TABS: 2.0000 mg | ORAL_TABLET | Freq: Once | ORAL | Status: DC

## 2018-12-30 NOTE — Evaluation (Signed)
Physical Therapy Evaluation Patient Details Name: William Flores MRN: [id number] DOB: October 12, 1950 Today's Date: 12/30/2018   History of Present Illness  68 year old male with a history of hypertension, diabetes, chronic kidney disease stage III, atrial fibrillation, presents with worsening generalized weakness.  Found to have significant anemia with a hemoglobin of 5.1.  Creatinine elevated at 2.3.  INR supratherapeutic at greater than 10 and was reversed with Kcentra.  Chest x-ray indicated possible pneumonia.  He was started on intravenous antibiotics and transfused PRBC.  Admitted for further work-up of anemia including GI evaluation.    Clinical Impression  Patient presents to hospital with deconditioning and weakness after acute anemia episode. Today was first day patient had been up and sat in a chair since he has arrived at the hospital. Patient able to transfer from sit to stand from chair with verbal cues but needed min guard assist as his knees would buckle with standing and his O2 saturation dropped to low 70's during standing and with seated lower extremity exercises while on 2L O2. Patient cued to perform pursed lip breathing and this immediately improved O2 levels. Patient lives alone and has no help during the day to assist with transfers and daily activities. Recommend patient to discharge to SNF to improve overall strength and functional mobility to decrease risk of fall and readmission to hospital.  Patient will continue to benefit from skilled physical therapy in hospital and recommended venue below to increase strength, balance, endurance for safe ADLs and gait.     Follow Up Recommendations SNF;Supervision for mobility/OOB    Equipment Recommendations  3in1 (PT);Rolling walker with 5" wheels    Recommendations for Other Services       Precautions / Restrictions Precautions Precautions: Fall Restrictions Weight Bearing Restrictions: No      Mobility  Bed  Mobility Overal bed mobility: Needs Assistance(per nursing as patient was up in chair and did not want to go to bed) Bed Mobility: Rolling;Sidelying to Sit Rolling: Modified independent (Device/Increase time) Sidelying to sit: Min assist          Transfers Overall transfer level: Needs assistance Equipment used: Rolling walker (2 wheeled) Transfers: Sit to/from [facility] Sit to Stand: Supervision Stand pivot transfers: Mod assist;Max assist(per nursing)       General transfer comment: verbal cues with sit to stand to push off from chair and then perform pursed lip breathing.  Ambulation/Gait             General Gait Details: did not walk secondary to O2 saturation, knees buckling with weight bearing and light headedness reported with standing.  Stairs            Wheelchair Mobility    Modified Rankin (Stroke Patients Only)       Balance Overall balance assessment: Mild deficits observed, not formally tested;Needs assistance Sitting-balance support: No upper extremity supported;Feet unsupported       Standing balance support: Bilateral upper extremity supported(supervision required, knees buckle)                                 Pertinent Vitals/Pain Pain Assessment: No/denies pain    Home Living Family/patient expects to be discharged to:: Private residence Living Arrangements: Alone Available Help at Discharge: Friend(s);Family;Available PRN/intermittently Type of Home: House Home Access: Stairs to enter Entrance Stairs-Rails: Right(when going up stairs) Entrance Stairs-Number of Steps: 8 Home Layout: One level Home Equipment: Cane - single  point;Grab bars - tub/shower;Other (comment)(rollator)      Prior Function Level of Independence: Independent with assistive device(s)         Comments: uses rollator as needed when gout flares up     Hand Dominance   Dominant Hand: Right    Extremity/Trunk Assessment         Lower Extremity Assessment Lower Extremity Assessment: Generalized weakness    Cervical / Trunk Assessment Cervical / Trunk Assessment: Normal  Communication   Communication: No difficulties  Cognition Arousal/Alertness: Awake/alert Behavior During Therapy: WFL for tasks assessed/performed Overall Cognitive Status: Within Functional Limits for tasks assessed                                        General Comments      Exercises General Exercises - Lower Extremity Long Arc Quad: AROM;Strengthening;Both;10 reps;Seated Hip Flexion/Marching: AROM;Strengthening;Both;10 reps;Seated Toe Raises: AROM;Strengthening;Both;10 reps;Seated Heel Raises: AROM;Strengthening;Both;10 reps;Seated   Assessment/Plan    PT Assessment Patient needs continued PT services  PT Problem List Decreased strength;Decreased range of motion;Decreased activity tolerance;Decreased balance;Decreased mobility       PT Treatment Interventions Gait training;DME instruction;Balance training;Neuromuscular re-education;Functional mobility training;Therapeutic activities;Therapeutic exercise;Patient/family education;Stair training    PT Goals (Current goals can be found in the Care Plan section)  Acute Rehab PT Goals Patient Stated Goal: to go home PT Goal Formulation: With patient Time For Goal Achievement: 01/13/19 Potential to Achieve Goals: Fair    Frequency Min 3X/week   Barriers to discharge Inaccessible home environment;Decreased caregiver support patient lives alone with 7-8 steps to enter. Family lives 9 miles away and has 5 kids of her own, would only be able to check on patient 1x/day max    Co-evaluation               AM-PAC PT "6 Clicks" Mobility  Outcome Measure Help needed turning from your back to your side while in a flat bed without using bedrails?: None Help needed moving from lying on your back to sitting on the side of a flat bed without using bedrails?: A  Little Help needed moving to and from a bed to a chair (including a wheelchair)?: A Lot Help needed standing up from a chair using your arms (e.g., wheelchair or bedside chair)?: A Little Help needed to walk in hospital room?: A Lot Help needed climbing 3-5 steps with a railing? : Total 6 Click Score: 15    End of Session Equipment Utilized During Treatment: Gait belt;Oxygen(2L O2 [place]) Activity Tolerance: Patient limited by fatigue Patient left: in chair;with call bell/phone within reach Nurse Communication: Mobility status PT Visit Diagnosis: Unsteadiness on feet (R26.81);Other abnormalities of gait and mobility (R26.89);Muscle weakness (generalized) (M62.81);Difficulty in walking, not elsewhere classified (R26.2)    Time: 3202-3343 PT Time Calculation (min) (ACUTE ONLY): 36 min   Charges:   PT Evaluation $PT Eval Moderate Complexity: 1 Mod PT Treatments $Therapeutic Exercise: 8-22 mins        10:10 AM, 12/30/18 Jerene Pitch, DPT Physical Therapy with [facility]  [phone] office

## 2018-12-30 NOTE — Progress Notes (Signed)
PROGRESS NOTE    William Flores  MIW:[id number] DOB: 18-Mar-1950 DOA: 12/25/2018 PCP: [name], [name], MD    Brief Narrative:  68 year old male with a history of hypertension, diabetes, chronic kidney disease stage III, atrial fibrillation, presents with worsening generalized weakness.  Found to have significant anemia with a hemoglobin of 5.1.  Creatinine elevated at 2.3.  INR supratherapeutic at greater than 10 and was reversed with Kcentra.  Chest x-ray indicated possible pneumonia.  He was started on intravenous antibiotics and transfused PRBC.  Admitted for further work-up of anemia including GI evaluation.   Assessment & Plan:   Active Problems:   History of colon cancer   Morbid obesity (HCC)   OSA (obstructive sleep apnea)   Atrial fibrillation with slow ventricular response (HCC)   AKI (acute kidney injury) ([place])   DM (diabetes mellitus), type 2, uncontrolled, with renal complications ([place])   Essential hypertension   CKD stage 3 due to type 2 diabetes mellitus ([place])   Acute anemia   Pulmonary hypertension ([place])   1. Acute blood loss anemia likely related to GI bleeding.  Patient reports noticing dark-colored stools intermittently.  He does have a history of colon cancer in the past which was resected.  He has been transfused a total of 3 units of PRBC.  Gastroenterology consulted.  He underwent EGD/colonoscopy with reports noted below.  Repeat hemoglobin in a.m. He received a dose of intravenous iron. 2. Acute respiratory failure with hypoxia secondary to community-acquired pneumonia.  Initial oxygen saturations 85% on room air, which improved after supplemental oxygen was applied.  Continue on intravenous antibiotics.  Wean down oxygen as tolerated. 3. Acute kidney injury.  Baseline creatinine 1.4-1.9.  Creatinine on admission 2.3.  Likely related to hypotension and severe anemia.  Creatinine has improved to baseline. 4. UTI.  Urine culture positive for staph epidermidis.   This has been adequately treated with antibiotics 5. History of nonischemic cardiomyopathy.  Chest x-ray shows pulmonary vascular congestion with layering pleural effusion.  Repeat echocardiogram shows normal ejection fraction, but increased pulmonary pressures and right-sided heart failure.  Pulmonary pressures noted to be at 76 mmHg.  This is likely secondary to his uncontrolled sleep apnea.   6. Supratherapeutic INR.  Elevated INR on admission.  Patient was taking warfarin.  This was reversed with Kcentra.  Continue to follow. 7. Chronic atrial fibrillation.  Currently on home dose of metoprolol.  Heart rate is stable.  Anticoagulated with warfarin 8. Diabetes.  Continue to follow blood sugars.  Holding home dose of Trulicity, Metformin, [place].  Continue on sliding scale insulin.  Blood sugars currently stable.   DVT prophylaxis: SCDs, warfarin Code Status: Full code Family Communication: Discussed with patient Disposition Plan: Skilled nursing facility placement on discharge   Consultants:   Gastroenterology  Procedures:  Colonoscopy:- Preparation of the colon was fair.                           - The examined portion of the ileum was normal.                           - One small polyp at the hepatic flexure. Biopsied.                           - One 4 mm polyp at the hepatic flexure, removed  with a cold snare. Complete resection. Polyp tissue                            not retrieved.                           - Seven 7 to 16 mm polyps at the splenic flexure                            and in the transverse colon, removed with a hot                            snare. Complete resection. Partial retrieval. Clips                            (MR conditional) were placed.                           - Two polyps not removed becuase of                             hyperperistalsis.  EGD: - Normal esophagus.                           - Z-line regular,  45 cm from the incisors.                           - Bilious gastric fluid. Fluid aspiration performed.                           - Nodular mucosa in the gastric body. Biopsied.                           - Normal duodenal bulb and second portion of the                            duodenum.  Antimicrobials:   Azithromycin 12/22 >12/27  Ceftriaxone 12/22>12/27   Subjective: No shortness of breath.  He is feeling better.  No significant cough.  Objective: Vitals:   12/30/18 1805 12/30/18 1810 12/30/18 1815 12/30/18 1921  BP:   (!) 94/59   Pulse: 83 96 94   Resp: 18 14 (!) 22   Temp:      TempSrc:      SpO2: (!) 84% 95% (!) 89% 97%  Weight:      Height:        Intake/Output Summary (Last 24 hours) at 12/30/2018 2052 Last data filed at 12/30/2018 1815 Gross per 24 hour  Intake 1269.56 ml  Output 531 ml  Net 738.56 ml   Filed Weights   12/26/18 1750 12/27/18 0500 12/30/18 0500  Weight: 108.8 kg 109.7 kg 119.9 kg    Examination:  General exam: Alert, awake, oriented x 3 Respiratory system: Clear to auscultation. Respiratory effort normal. Cardiovascular system: Irregular. No murmurs, rubs, gallops. Gastrointestinal system: Abdomen is nondistended, soft and nontender. No organomegaly or masses felt. Normal bowel sounds heard. Central nervous system: Alert and oriented.  No focal neurological deficits. Extremities: No C/C/E, +pedal pulses Skin: No rashes, lesions or ulcers Psychiatry: Judgement and insight appear normal. Mood & affect appropriate.      Data Reviewed: I have personally reviewed following labs and imaging studies  CBC: Recent Labs  Lab 12/26/18 2027 12/27/18 0416 12/28/18 0524 12/29/18 0358 12/30/18 0424  WBC 7.9 7.1 5.8 4.5 4.5  HGB 7.9* 7.5* 7.3* 7.4* 7.6*  HCT 27.6* 26.1* 26.3* 26.6* 28.2*  MCV 87.3 86.4 88.3 90.5 92.2  PLT 170 168 153 147* 680   Basic Metabolic Panel: Recent Labs  Lab 12/25/18 1902 12/26/18 0355 12/27/18 0416  12/28/18 0524 12/29/18 0358  NA 137 139 138 136 136  K 4.7 4.3 3.9 3.7 4.2  CL 109 109 109 108 109  CO2 19* 19* 18* 20* 21*  GLUCOSE 143* 114* 116* 123* 136*  BUN 78* 77* 51* 35* 29*  CREATININE 2.31* 2.20* 1.57* 1.35* 1.40*  CALCIUM 8.4* 8.4* 8.4* 8.3* 8.3*   GFR: Estimated Creatinine Clearance: 61.3 mL/min (A) (by C-G formula based on SCr of 1.4 mg/dL (H)). Liver Function Tests: Recent Labs  Lab 12/25/18 1902  AST 12*  ALT 10  ALKPHOS 54  BILITOT 1.0  PROT 6.4*  ALBUMIN 3.1*   No results for input(s): LIPASE, AMYLASE in the last 168 hours. No results for input(s): AMMONIA in the last 168 hours. Coagulation Profile: Recent Labs  Lab 12/25/18 1902 12/26/18 0355 12/27/18 0416 12/28/18 0524 12/30/18 0424  INR >10.0* 1.9* 1.5* 1.7* 2.4*   Cardiac Enzymes: No results for input(s): CKTOTAL, CKMB, CKMBINDEX, TROPONINI in the last 168 hours. BNP (last 3 results) No results for input(s): PROBNP in the last 8760 hours. HbA1C: No results for input(s): HGBA1C in the last 72 hours. CBG: Recent Labs  Lab 12/29/18 1736 12/29/18 2134 12/30/18 0749 12/30/18 1154 12/30/18 1653  GLUCAP 170* 150* 146* 174* 138*   Lipid Profile: No results for input(s): CHOL, HDL, LDLCALC, TRIG, CHOLHDL, LDLDIRECT in the last 72 hours. Thyroid Function Tests: No results for input(s): TSH, T4TOTAL, FREET4, T3FREE, THYROIDAB in the last 72 hours. Anemia Panel: No results for input(s): VITAMINB12, FOLATE, FERRITIN, TIBC, IRON, RETICCTPCT in the last 72 hours. Sepsis Labs: Recent Labs  Lab 12/25/18 1902  LATICACIDVEN 1.8    Recent Results (from the past 240 hour(s))  Culture, Urine     Status: Abnormal   Collection Time: 12/25/18  6:20 PM   Specimen: Urine, Random  Result Value Ref Range Status   Specimen Description   Final    URINE, RANDOM Performed at [facility], [address], [place], [place] [address]    Special Requests   Final    NONE Performed at [facility],  [address] [place], [place] [address]    Culture >=100,000 COLONIES/mL STAPHYLOCOCCUS EPIDERMIDIS (A)  Final   Report Status 12/28/2018 FINAL  Final   Organism ID, Bacteria STAPHYLOCOCCUS EPIDERMIDIS (A)  Final      Susceptibility   Staphylococcus epidermidis - MIC*    CIPROFLOXACIN <=0.5 SENSITIVE Sensitive     GENTAMICIN <=0.5 SENSITIVE Sensitive     NITROFURANTOIN <=16 SENSITIVE Sensitive     OXACILLIN <=0.25 SENSITIVE Sensitive     TETRACYCLINE 2 SENSITIVE Sensitive     VANCOMYCIN 1 SENSITIVE Sensitive     TRIMETH/SULFA <=10 SENSITIVE Sensitive     CLINDAMYCIN <=0.25 SENSITIVE Sensitive     RIFAMPIN <=0.5 SENSITIVE Sensitive     Inducible Clindamycin NEGATIVE Sensitive     * >=100,000 COLONIES/mL  STAPHYLOCOCCUS EPIDERMIDIS  Blood Culture x 1     Status: None   Collection Time: 12/25/18  7:03 PM   Specimen: BLOOD  Result Value Ref Range Status   Specimen Description BLOOD LEFT ANTECUBITAL  Final   Special Requests   Final    BOTTLES DRAWN AEROBIC AND ANAEROBIC Blood Culture adequate volume   Culture   Final    NO GROWTH 5 DAYS Performed at [facility], [address] [place], [place] [address]    Report Status 12/30/2018 FINAL  Final  SARS CORONAVIRUS 2 (TAT 6-24 HRS) Nasopharyngeal Nasopharyngeal Swab     Status: None   Collection Time: 12/25/18  8:32 PM   Specimen: Nasopharyngeal Swab  Result Value Ref Range Status   SARS Coronavirus 2 NEGATIVE NEGATIVE Final    Comment: (NOTE) SARS-CoV-2 target nucleic acids are NOT DETECTED. The SARS-CoV-2 RNA is generally detectable in upper and lower respiratory specimens during the acute phase of infection. Negative results do not preclude SARS-CoV-2 infection, do not rule out co-infections with other pathogens, and should not be used as the sole basis for treatment or other patient management decisions. Negative results must be combined with clinical observations, patient history, and epidemiological information. The expected  result is Negative. Fact Sheet for Patients: SugarRoll.be Fact Sheet for Healthcare Providers: https://www.woods-mathews.com/ This test is not yet approved or cleared by the [place] FDA and  has been authorized for detection and/or diagnosis of SARS-CoV-2 by FDA under an Emergency Use Authorization (EUA). This EUA will remain  in effect (meaning this test can be used) for the duration of the COVID-19 declaration under Section 56 4(b)(1) of the Act, 21 U.S.C. section 360bbb-3(b)(1), unless the authorization is terminated or revoked sooner. Performed at [facility], [place] [address] [place], [place] [address]   MRSA PCR Screening     Status: None   Collection Time: 12/26/18  4:47 PM   Specimen: Nasopharyngeal  Result Value Ref Range Status   MRSA by PCR NEGATIVE NEGATIVE Final    Comment:        The GeneXpert MRSA Assay (FDA approved for NASAL specimens only), is one component of a comprehensive MRSA colonization surveillance program. It is not intended to diagnose MRSA infection nor to guide or monitor treatment for MRSA infections. Performed at [facility], [address], [place], [place] [address]          Radiology Studies: No results found.      Scheduled Meds: . allopurinol  300 mg Oral Daily  . atorvastatin  20 mg Oral q1800  . Chlorhexidine Gluconate Cloth  6 each Topical Daily  . furosemide  80 mg Oral Daily  . insulin aspart  0-15 Units Subcutaneous TID WC & HS  . mouth rinse  15 mL Mouth Rinse BID  . metoCLOPramide  5 mg Oral TID AC  . metoprolol tartrate  50 mg Oral BID  . pantoprazole (PROTONIX) IV  40 mg Intravenous QHS  . Warfarin - Pharmacist Dosing Inpatient   Does not apply q1800   Continuous Infusions: . sodium chloride 20 mL/hr at 12/28/18 1659  . azithromycin 500 mg (12/30/18 1810)  . cefTRIAXone (ROCEPHIN)  IV 2 g (12/30/18 1713)     LOS: 5 days    Time spent: 67mns     JKathie Dike MD [facility]   If 7PM-7AM, please contact night-coverage [email or web address]  12/30/2018, 8:52 PM

## 2018-12-30 NOTE — Plan of Care (Signed)
  Problem: Acute Rehab PT Goals(only PT should resolve) Goal: Pt Will Go Supine/Side To Sit Flowsheets (Taken 12/30/2018 1005) Pt will go Supine/Side to Sit: with modified independence Goal: Patient Will Transfer Sit To/From Stand Flowsheets (Taken 12/30/2018 1005) Patient will transfer sit to/from stand: with modified independence Goal: Pt Will Ambulate Flowsheets (Taken 12/30/2018 1005) Pt will Ambulate:  10 feet  with minimal assist  with rolling walker   10:05 AM, 12/30/18 Jerene Pitch, DPT Physical Therapy with [facility]  [phone] office

## 2018-12-30 NOTE — Progress Notes (Addendum)
ANTICOAGULATION CONSULT NOTE - Initial Up Consult   Pharmacy Consult for warfarin dosing  Indication: atrial fibrillation   Allergies  Allergen Reactions  . Codeine     Headache       Patient Measurements: Last Weight  Most recent update: 12/30/2018  6:10 AM   Weight  119.9 kg (264 lb 5.3 oz)           Body mass index is 45.37 kg/m. William Flores               Temp: 98.1 F (36.7 C) (12/27 0400) Temp Source: Oral (12/27 0400) BP: 93/45 (12/27 0200) Pulse Rate: 80 (12/27 0200)  Labs: Recent Labs    12/28/18 0524 12/29/18 0358 12/30/18 0424  HGB 7.3* 7.4* 7.6*  HCT 26.3* 26.6* 28.2*  PLT 153 147* 155  LABPROT 20.1*  --  26.3*  INR 1.7*  --  2.4*  CREATININE 1.35* 1.40*  --     Estimated Creatinine Clearance: 61.3 mL/min (A) (by C-G formula based on SCr of 1.4 mg/dL (H)).     Medications:  Medications Prior to Admission  Medication Sig Dispense Refill Last Dose  . allopurinol (ZYLOPRIM) 300 MG tablet TAKE 1 TABLET BY MOUTH EVERY DAY (Patient taking differently: Take 300 mg by mouth daily. ) 90 tablet 1 12/25/2018 at Unknown time  . atorvastatin (LIPITOR) 20 MG tablet 1 tablet daily (Patient taking differently: Take 20 mg by mouth daily. ) 90 tablet 3 12/24/2018 at Unknown time  . azelastine (OPTIVAR) 0.05 % ophthalmic solution PLACE 2 DROPS INTO BOTH EYES 2 (TWO) TIMES DAILY. (Patient taking differently: Place 2 drops into the right eye 2 (two) times daily. ) 6 mL 5 12/25/2018 at Unknown time  . dapagliflozin propanediol (FARXIGA) 5 MG TABS tablet Take 5 mg by mouth daily. 90 tablet 3 12/25/2018 at Unknown time  . diltiazem (CARDIZEM CD) 180 MG 24 hr capsule TAKE 1 CAPSULE BY MOUTH EVERY DAY (Patient taking differently: Take 180 mg by mouth daily. ) 90 capsule 0 12/25/2018 at Unknown time  . furosemide (LASIX) 80 MG tablet TAKE 1 TABLET BY MOUTH IN MORNING AND 1/2 TABLET IN THE EVENING (Patient taking differently: Take 40-80 mg by mouth See admin instructions.  TAKE 1 TABLET BY MOUTH IN MORNING AND 1/2 TABLET IN THE EVENING) 135 tablet 0 12/25/2018 at Unknown time  . losartan (COZAAR) 100 MG tablet Take 100 mg by mouth daily.   12/25/2018 at Unknown time  . metFORMIN (GLUCOPHAGE) 1000 MG tablet TAKE 0.5 TABLETS (500 MG TOTAL) BY MOUTH 2 (TWO) TIMES DAILY WITH A MEAL. 90 tablet 0 12/25/2018 at Unknown time  . metoprolol tartrate (LOPRESSOR) 50 MG tablet Take 1 tablet (50 mg total) by mouth 2 (two) times daily. 180 tablet 3 12/25/2018 at morning  . omeprazole (PRILOSEC) 20 MG capsule Take 1 capsule (20 mg total) by mouth daily. 90 capsule 3 12/25/2018 at Unknown time  . potassium chloride SA (KLOR-CON M20) 20 MEQ tablet TAKE 1/2 TABLET (10 MEQ TOTAL) BY MOUTH DAILY. NEED OV. *INS WILL COVER 4/19 OR LATER* (Patient taking differently: Take 10 mEq by mouth daily. ) 45 tablet 1 12/25/2018 at Unknown time  . TRESIBA FLEXTOUCH 100 UNIT/ML SOPN FlexTouch Pen INJECT 0.26 MLS (26 UNITS TOTAL) INTO THE SKIN DAILY. (Patient taking differently: 26 Units daily. ) 15 pen 1 12/25/2018 at Unknown time  . TRULICITY 1.5 UT/6.5YY SOPN INJECT 1.5 MG INTO THE SKIN ONCE A WEEK. 6 mL 0 Past Week at  Unknown time  . warfarin (COUMADIN) 5 MG tablet TAKE AS DIRECTED (Patient taking differently: Take 5 mg by mouth See admin instructions. 1 tablet on Tuesday and Thursday and 1 and 1/2 tablet on Monday, Wednesday Friday, Saturday and Sunday) 180 tablet 0 12/25/2018 at 0900  . colchicine 0.6 MG tablet Take 2 tablets st first sign of gout flare. May take an additional tablet in 1 hour if needed.  Max of 3 tablets in 24 hours. (Patient not taking: Reported on 12/26/2018) 10 tablet 1 Not Taking at Unknown time  . Insulin Pen Needle 32G X 4 MM MISC Use to give insulin daily Dx E11.29 (Patient not taking: Reported on 12/25/2018) 100 each 3 Not Taking at Unknown time  . polyvinyl alcohol (LIQUIFILM TEARS) 1.4 % ophthalmic solution Place 1 drop into both eyes as needed for dry eyes. (Patient not  taking: Reported on 12/26/2018) 15 mL 0 Not Taking at Unknown time   Scheduled:  . allopurinol  300 mg Oral Daily  . atorvastatin  20 mg Oral q1800  . Chlorhexidine Gluconate Cloth  6 each Topical Daily  . furosemide  80 mg Oral Daily  . insulin aspart  0-15 Units Subcutaneous TID WC & HS  . mouth rinse  15 mL Mouth Rinse BID  . metoCLOPramide  5 mg Oral TID AC  . metoprolol tartrate  50 mg Oral BID  . pantoprazole (PROTONIX) IV  40 mg Intravenous QHS  . Warfarin - Pharmacist Dosing Inpatient   Does not apply q1800   Infusions:  . sodium chloride 20 mL/hr at 12/28/18 1659  . azithromycin 500 mg (12/29/18 2056)  . cefTRIAXone (ROCEPHIN)  IV 2 g (12/29/18 2019)   PRN: acetaminophen **OR** acetaminophen, ondansetron **OR** ondansetron (ZOFRAN) IV, polyethylene glycol Anti-infectives (From admission, onward)   Start     Dose/Rate Route Frequency Ordered Stop   12/26/18 1900  cefTRIAXone (ROCEPHIN) 2 g in sodium chloride 0.9 % 100 mL IVPB     2 g 200 mL/hr over 30 Minutes Intravenous Every 24 hours 12/25/18 2317     12/26/18 1900  azithromycin (ZITHROMAX) 500 mg in sodium chloride 0.9 % 250 mL IVPB  Status:  Discontinued     500 mg 250 mL/hr over 60 Minutes Intravenous Every 24 hours 12/25/18 2317 12/25/18 2319   12/25/18 1915  cefTRIAXone (ROCEPHIN) 2 g in sodium chloride 0.9 % 100 mL IVPB     2 g 200 mL/hr over 30 Minutes Intravenous  Once 12/25/18 1913 12/25/18 2010   12/25/18 1915  azithromycin (ZITHROMAX) 500 mg in sodium chloride 0.9 % 250 mL IVPB     50 0 mg 250 mL/hr over 60 Minutes Intravenous Every 24 hours 12/25/18 1913        Goal of Therapy:  INR 2-3 Monitor platelets by anticoagulation protocol: Yes    Prior to Admission Warfarin Dosing:  ARDA KEADLE takes 34m and 7.597mpo alternating days prior to admission.    Admit INR was > 10  Lab Results  Component Value Date   INR 2.4 (H) 12/30/2018   INR 1.7 (H) 12/28/2018   INR 1.5 (H) 12/27/2018     Assessment: RoJOUSHUA Flores [age].o. male requires anticoagulation with warfarin for the indication of  atrial fibrillation. INR was >10 upon admission, [name] gotten 1076mf vitamin k twice, and 3mg47m/15. Warfarin will be initiated inpatient following pharmacy protocol per pharmacy consult. Patient most recent blood work is as follows: CBC Latest Ref  Rng & Units 12/30/2018 12/29/2018 12/28/2018  WBC 4.0 - 10.5 K/uL 4.5 4.5 5.8  Hemoglobin 13.0 - 17.0 g/dL 7.6(L) 7.4(L) 7.3(L)  Hematocrit 39.0 - 52.0 % 28.2(L) 26.6(L) 26.3(L)  Platelets 150 - 400 K/uL 155 147(L) 153   INR jumped from 1.7 to 2.4, will be conservative with today's dose.   Plan: Warfarin 18m po x 1 Monitor CBC daily with am labs   Monitor INR daily Monitor for signs and symptoms of bleeding   GDonna ChristenCoffee, PharmD, MBA, BCGP Clinical Pharmacist

## 2018-12-31 DIAGNOSIS — K3189 Other diseases of stomach and duodenum: Secondary | ICD-10-CM

## 2018-12-31 DIAGNOSIS — R630 Anorexia: Secondary | ICD-10-CM

## 2018-12-31 DIAGNOSIS — D123 Benign neoplasm of transverse colon: Secondary | ICD-10-CM

## 2018-12-31 DIAGNOSIS — N179 Acute kidney failure, unspecified: Secondary | ICD-10-CM

## 2018-12-31 LAB — BASIC METABOLIC PANEL
Anion gap: 9 (ref 5–15)
BUN: 26 mg/dL — ABNORMAL HIGH (ref 8–23)
CO2: 21 mmol/L — ABNORMAL LOW (ref 22–32)
Calcium: 8.8 mg/dL — ABNORMAL LOW (ref 8.9–10.3)
Chloride: 106 mmol/L (ref 98–111)
Creatinine, Ser: 1.37 mg/dL — ABNORMAL HIGH (ref 0.61–1.24)
GFR calc Af Amer: >60 mL/min (ref >60)
GFR calc non Af Amer: 53 mL/min — ABNORMAL LOW (ref >60)
Glucose, Bld: 127 mg/dL — ABNORMAL HIGH (ref 70–99)
Potassium: 4.3 mmol/L (ref 3.5–5.1)
Sodium: 136 mmol/L (ref 135–145)

## 2018-12-31 LAB — PREPARE RBC (CROSSMATCH)

## 2018-12-31 LAB — CBC
HCT: 25.5 % — ABNORMAL LOW (ref 39.0–52.0)
Hemoglobin: 7 g/dL — ABNORMAL LOW (ref 13.0–17.0)
MCH: 25.1 pg — ABNORMAL LOW (ref 26.0–34.0)
MCHC: 27.5 g/dL — ABNORMAL LOW (ref 30.0–36.0)
MCV: 91.4 fL (ref 80.0–100.0)
Platelets: 146 10*3/uL — ABNORMAL LOW (ref 150–400)
RBC: 2.79 MIL/uL — ABNORMAL LOW (ref 4.22–5.81)
RDW: 21 % — ABNORMAL HIGH (ref 11.5–15.5)
WBC: 4.8 10*3/uL (ref 4.0–10.5)
nRBC: 1 % — ABNORMAL HIGH (ref 0.0–0.2)

## 2018-12-31 LAB — PROTIME-INR
INR: 1.9 — ABNORMAL HIGH (ref 0.8–1.2)
Prothrombin Time: 22.1 seconds — ABNORMAL HIGH (ref 11.4–15.2)

## 2018-12-31 LAB — GLUCOSE, CAPILLARY
Glucose-Capillary: 124 mg/dL — ABNORMAL HIGH (ref 70–99)
Glucose-Capillary: 125 mg/dL — ABNORMAL HIGH (ref 70–99)
Glucose-Capillary: 160 mg/dL — ABNORMAL HIGH (ref 70–99)
Glucose-Capillary: 162 mg/dL — ABNORMAL HIGH (ref 70–99)

## 2018-12-31 LAB — SARS CORONAVIRUS 2 (TAT 6-24 HRS): SARS Coronavirus 2: NEGATIVE

## 2018-12-31 LAB — OCCULT BLOOD X 1 CARD TO LAB, STOOL: Fecal Occult Bld: POSITIVE — AB

## 2018-12-31 MED ORDER — FUROSEMIDE 10 MG/ML IJ SOLN
20.0000 mg | Freq: Once | INTRAMUSCULAR | Status: AC
  Administered 2018-12-31: 20 mg via INTRAVENOUS
  Filled 2018-12-31: qty 2

## 2018-12-31 MED ORDER — SODIUM CHLORIDE 0.9% IV SOLUTION: Freq: Once | INTRAVENOUS | Status: AC

## 2018-12-31 MED ORDER — PANTOPRAZOLE SODIUM 40 MG PO TBEC
40.0000 mg | DELAYED_RELEASE_TABLET | Freq: Two times a day (BID) | ORAL | Status: DC
  Administered 2018-12-31 – 2019-01-01 (×2): 40 mg via ORAL
  Filled 2018-12-31 (×2): qty 1

## 2018-12-31 MED ORDER — WARFARIN SODIUM 2 MG PO TABS
2.0000 mg | ORAL_TABLET | Freq: Once | ORAL | Status: AC
  Administered 2018-12-31: 17:00:00 2 mg via ORAL
  Filled 2018-12-31: qty 1

## 2018-12-31 NOTE — Progress Notes (Addendum)
PROGRESS NOTE    William Flores  YCX:[id number] DOB: [birth year] DOA: 12/25/2018 PCP: [name], [name], MD    Brief Narrative:  69 year old male with a history of hypertension, diabetes, chronic kidney disease stage III, atrial fibrillation, presents with worsening generalized weakness.  Found to have significant anemia with a hemoglobin of 5.1.  Creatinine elevated at 2.3.  INR supratherapeutic at greater than 10 and was reversed with Kcentra.  Chest x-ray indicated possible pneumonia.  He was started on intravenous antibiotics and transfused PRBC.  Admitted for further work-up of anemia including GI evaluation.   Assessment & Plan:   Active Problems:   AKI (acute kidney injury) ([place])   Acute anemia   Atrial fibrillation with slow ventricular response (HCC)   DM (diabetes mellitus), type 2, uncontrolled, with renal complications ([place])   History of colon cancer   Morbid obesity (HCC)   OSA (obstructive sleep apnea)   Essential hypertension   CKD stage 3 due to type 2 diabetes mellitus ([place])   Pulmonary hypertension ([place])   1. Acute blood loss anemia likely related to GI bleeding.  Patient reports noticing dark-colored stools intermittently.  He does have a history of colon cancer in the past which was resected.  He has been transfused a total of 3 units of PRBC, hemoglobin down to 7.0, patient with fatigue and achiness, will transfuse additional 1 unit of packed cells on 12/31/2018 for symptomatic anemia gastroenterology consulted.  He underwent EGD/colonoscopy with reports noted below.   Since iron panel indicates iron deficiency, patient received iron infusion.   2. Acute respiratory failure with hypoxia secondary to community-acquired pneumonia.  Initial oxygen saturations 85% on room air, which improved after supplemental oxygen was applied.  Completed Rocephin/azithromycin x7 days.  Wean down oxygen as tolerated.   3. Acute kidney injury.  Baseline creatinine 1.4-1.9.   Creatinine on admission 2.3.  Likely related to hypotension and severe anemia.  Creatinine has improved back to baseline (1.4)   4)UTI.  Urine culture positive for staph epidermidis.  Completed 7 days of IV antibiotics as above in #2  5)History of nonischemic cardiomyopathy.  Chest x-ray shows pulmonary vascular congestion with layering pleural effusion.  Repeat echocardiogram shows normal ejection fraction, but increased pulmonary pressures and right-sided heart failure.  Pulmonary pressures noted to be at 76 mmHg.  This is likely secondary to his uncontrolled sleep apnea.   --- Continue  Lasix  6)Supratherapeutic INR--- on admission INR was over 10,  Patient was taking warfarin.  This was reversed with Kcentra.  Pharmacy consult for Coumadin management appreciated  7)Chronic atrial fibrillation.  Continue metoprolol 50 mg twice a day for rate control,.  Heart rate is stable.  Pharmacy consult for Coumadin management appreciated   8)Diabetes.  Continue to follow blood sugars.  Holding home dose of Trulicity, Metformin, [place].  Continue on sliding scale insulin--- -A1c is 4.9, patient is at risk for hypoglycemia   DVT prophylaxis: SCDs, warfarin Code Status: Full code Family Communication: Discussed with patient Disposition Plan: Discharge to SNF rehab at [facility] on 01/01/2019 pending bed availability and negative repeat Covid test  Consultants:   Gastroenterology  Procedures:  Colonoscopy:- Preparation of the colon was fair.                           - The examined portion of the ileum was normal.                           -  One small polyp at the hepatic flexure. Biopsied.                           - One 4 mm polyp at the hepatic flexure, removed  with a cold snare. Complete resection.                  - Seven 7 to 16 mm polyps at the splenic flexure                            and in the transverse colon, removed with a hot snare. Complete resection. Partial  retrieval. Clips                            (MR conditional) were placed.                           - Two polyps not removed becuase of                             hyperperistalsis.  EGD: - Normal esophagus.                           - Z-line regular, 45 cm from the incisors.                           - Bilious gastric fluid. Fluid aspiration performed.                           - Nodular mucosa in the gastric body. Biopsied.                           - Normal duodenal bulb and second portion of the                            duodenum.  Antimicrobials:   Azithromycin 12/22 > 12/31/18  Ceftriaxone 12/22>    Subjective:  -Complains of fatigue, some dizziness hemoglobin down to 7.0 -Denies vomiting or  blood in stool  Objective: Vitals:   12/31/18 0900 12/31/18 0936 12/31/18 1000 12/31/18 1111  BP: 116/68 116/68 121/65   Pulse: (!) 102 (!) 108 97 93  Resp: 19  (!) 24 20  Temp:    98.1 F (36.7 C)  TempSrc:    Oral  SpO2: 96%  97% 99%  Weight:      Height:        Intake/Output Summary (Last 24 hours) at 12/31/2018 1405 Last data filed at 12/31/2018 0200 Gross per 24 hour  Intake 749.56 ml  Output 451 ml  Net 298.56 ml   Filed Weights   12/27/18 0500 12/30/18 0500 12/31/18 0500  Weight: 109.7 kg 119.9 kg 116.5 kg    Examination:  General exam: Alert, awake, oriented x 3 Respiratory system: Clear to auscultation. Respiratory effort normal. Cardiovascular system: Irregular. No murmurs, rubs, gallops. Gastrointestinal system: Abdomen is nondistended, soft and nontender.  Normal bowel sounds heard. Central nervous system: Alert and oriented.  Generalized weakness, no focal neurological deficits. Extremities: No C/C/E, +pedal pulses Skin: No rashes, lesions or ulcers  Psychiatry: Judgement and insight appear normal. Mood & affect appropriate.     Data Reviewed: I have personally reviewed following labs and imaging studies  CBC: Recent Labs  Lab 12/27/18 0416  12/28/18 0524 12/29/18 0358 12/30/18 0424 12/31/18 0337  WBC 7.1 5.8 4.5 4.5 4.8  HGB 7.5* 7.3* 7.4* 7.6* 7.0*  HCT 26.1* 26.3* 26.6* 28.2* 25.5*  MCV 86.4 88.3 90.5 92.2 91.4  PLT 168 153 147* 155 397*   Basic Metabolic Panel: Recent Labs  Lab 12/26/18 0355 12/27/18 0416 12/28/18 0524 12/29/18 0358 12/31/18 0337  NA 139 138 136 136 136  K 4.3 3.9 3.7 4.2 4.3  CL 109 109 108 109 106  CO2 19* 18* 20* 21* 21*  GLUCOSE 114* 116* 123* 136* 127*  BUN 77* 51* 35* 29* 26*  CREATININE 2.20* 1.57* 1.35* 1.40* 1.37*  CALCIUM 8.4* 8.4* 8.3* 8.3* 8.8*   GFR: Estimated Creatinine Clearance: 61.6 mL/min (A) (by C-G formula based on SCr of 1.37 mg/dL (H)). Liver Function Tests: Recent Labs  Lab 12/25/18 1902  AST 12*  ALT 10  ALKPHOS 54  BILITOT 1.0  PROT 6.4*  ALBUMIN 3.1*   No results for input(s): LIPASE, AMYLASE in the last 168 hours. No results for input(s): AMMONIA in the last 168 hours. Coagulation Profile: Recent Labs  Lab 12/26/18 0355 12/27/18 0416 12/28/18 0524 12/30/18 0424 12/31/18 0337  INR 1.9* 1.5* 1.7* 2.4* 1.9*   Cardiac Enzymes: No results for input(s): CKTOTAL, CKMB, CKMBINDEX, TROPONINI in the last 168 hours. BNP (last 3 results) No results for input(s): PROBNP in the last 8760 hours. HbA1C: No results for input(s): HGBA1C in the last 72 hours. CBG: Recent Labs  Lab 12/30/18 1154 12/30/18 1653 12/30/18 2114 12/31/18 0729 12/31/18 1110  GLUCAP 174* 138* 166* 124* 125*   Lipid Profile: No results for input(s): CHOL, HDL, LDLCALC, TRIG, CHOLHDL, LDLDIRECT in the last 72 hours. Thyroid Function Tests: No results for input(s): TSH, T4TOTAL, FREET4, T3FREE, THYROIDAB in the last 72 hours. Anemia Panel: No results for input(s): VITAMINB12, FOLATE, FERRITIN, TIBC, IRON, RETICCTPCT in the last 72 hours. Sepsis Labs: Recent Labs  Lab 12/25/18 1902  LATICACIDVEN 1.8    Recent Results (from the past 240 hour(s))  Culture, Urine     Status:  Abnormal   Collection Time: 12/25/18  6:20 PM   Specimen: Urine, Random  Result Value Ref Range Status   Specimen Description   Final    URINE, RANDOM Performed at [facility], [address], [place], [place] [address]    Special Requests   Final    NONE Performed at [facility], [address] [place], [place] [address]    Culture >=100,000 COLONIES/mL STAPHYLOCOCCUS EPIDERMIDIS (A)  Final   Report Status 12/28/2018 FINAL  Final   Organism ID, Bacteria STAPHYLOCOCCUS EPIDERMIDIS (A)  Final      Susceptibility   Staphylococcus epidermidis - MIC*    CIPROFLOXACIN <=0.5 SENSITIVE Sensitive     GENTAMICIN <=0.5 SENSITIVE Sensitive     NITROFURANTOIN <=16 SENSITIVE Sensitive     OXACILLIN <=0.25 SENSITIVE Sensitive     TETRACYCLINE 2 SENSITIVE Sensitive     VANCOMYCIN 1 SENSITIVE Sensitive     TRIMETH/SULFA <=10 SENSITIVE Sensitive     CLINDAMYCIN <=0.25 SENSITIVE Sensitive     RIFAMPIN <=0.5 SENSITIVE Sensitive     Inducible Clindamycin NEGATIVE Sensitive     * >=100,000 COLONIES/mL STAPHYLOCOCCUS EPIDERMIDIS  Blood Culture x 1     Status: None   Collection  Time: 12/25/18  7:03 PM   Specimen: BLOOD  Result Value Ref Range Status   Specimen Description BLOOD LEFT ANTECUBITAL  Final   Special Requests   Final    BOTTLES DRAWN AEROBIC AND ANAEROBIC Blood Culture adequate volume   Culture   Final    NO GROWTH 5 DAYS Performed at [facility], [address], [place], [place] [address]    Report Status 12/30/2018 FINAL  Final  SARS CORONAVIRUS 2 (TAT 6-24 HRS) Nasopharyngeal Nasopharyngeal Swab     Status: None   Collection Time: 12/25/18  8:32 PM   Specimen: Nasopharyngeal Swab  Result Value Ref Range Status   SARS Coronavirus 2 NEGATIVE NEGATIVE Final    Comment: (NOTE) SARS-CoV-2 target nucleic acids are NOT DETECTED. The SARS-CoV-2 RNA is generally detectable in upper and lower respiratory specimens during the acute phase of infection. Negative results do not preclude  SARS-CoV-2 infection, do not rule out co-infections with other pathogens, and should not be used as the sole basis for treatment or other patient management decisions. Negative results must be combined with clinical observations, patient history, and epidemiological information. The expected result is Negative. Fact Sheet for Patients: SugarRoll.be Fact Sheet for Healthcare Providers: https://www.woods-mathews.com/ This test is not yet approved or cleared by the [place] FDA and  has been authorized for detection and/or diagnosis of SARS-CoV-2 by FDA under an Emergency Use Authorization (EUA). This EUA will remain  in effect (meaning this test can be used) for the duration of the COVID-19 declaration under Section 56 4(b)(1) of the Act, 21 U.S.C. section 360bbb-3(b)(1), unless the authorization is terminated or revoked sooner. Performed at [facility], [place] [address], [place], [place] [address]   MRSA PCR Screening     Status: None   Collection Time: 12/26/18  4:47 PM   Specimen: Nasopharyngeal  Result Value Ref Range Status   MRSA by PCR NEGATIVE NEGATIVE Final    Comment:        The GeneXpert MRSA Assay (FDA approved for NASAL specimens only), is one component of a comprehensive MRSA colonization surveillance program. It is not intended to diagnose MRSA infection nor to guide or monitor treatment for MRSA infections. Performed at [facility], [address] [place], [place] [address]      Radiology Studies: No results found.   Scheduled Meds: . sodium chloride   Intravenous Once  . allopurinol  300 mg Oral Daily  . atorvastatin  20 mg Oral q1800  . Chlorhexidine Gluconate Cloth  6 each Topical Daily  . furosemide  20 mg Intravenous Once  . furosemide  80 mg Oral Daily  . insulin aspart  0-15 Units Subcutaneous TID WC & HS  . mouth rinse  15 mL Mouth Rinse BID  . metoCLOPramide  5 mg Oral TID AC  . metoprolol  tartrate  50 mg Oral BID  . pantoprazole  40 mg Oral BID AC  . warfarin  2 mg Oral ONCE-1800  . Warfarin - Pharmacist Dosing Inpatient   Does not apply q1800   Continuous Infusions:   LOS: 6 days   Roxan Hockey, MD [facility]   If 7PM-7AM, please contact night-coverage [email or web address]  12/31/2018, 2:05 PM

## 2018-12-31 NOTE — TOC Initial Note (Signed)
Transition of Care [facility]) - Initial/Assessment Note    Patient Details  Name: William Flores MRN: [id number] Date of Birth: [birth year]  Transition [facility]) CM/SW Contact:    Boneta Lucks, RN Phone Number: 12/31/2018, 1:14 PM  Clinical Narrative:  Patient admitted for acute anemia. Patient lives will Louie Casa a relative. PT is recommending SNF. Patient is medically ready.  FL2 done and sent out to choices given by patient.  Patient will transfer from unit to med surg when bed available.    TOC to follow.                Expected Discharge Plan: Skilled Nursing Facility Barriers to Discharge: No SNF bed   Patient Goals and CMS Choice Patient states their goals for this hospitalization and ongoing recovery are:: to go to SNF then return home. CMS Medicare.gov Compare Post Acute Care list provided to:: Patient Choice offered to / list presented to : Patient  Expected Discharge Plan and Services Expected Discharge Plan: [place]       Prior Living Arrangements/Services   Lives with:: Relatives   Do you feel safe going back to the place where you live?: Yes      Need for Family Participation in Patient Care: Yes (Comment) Care giver support system in place?: Yes (comment)   Criminal Activity/Legal Involvement Pertinent to Current Situation/Hospitalization: No - Comment as needed  Activities of Daily Living Home Assistive Devices/Equipment: Cane (specify quad or straight), Walker (specify type) ADL Screening (condition at time of admission) Patient's cognitive ability adequate to safely complete daily activities?: Yes Is the patient deaf or have difficulty hearing?: No Does the patient have difficulty seeing, even when wearing glasses/contacts?: No Does the patient have difficulty concentrating, remembering, or making decisions?: No Patient able to express need for assistance with ADLs?: Yes Does the patient have difficulty dressing or bathing?:  No Independently performs ADLs?: Yes (appropriate for developmental age) Does the patient have difficulty walking or climbing stairs?: Yes Weakness of Legs: Both Weakness of Arms/Hands: None  Permission Sought/Granted       Emotional Assessment     Affect (typically observed): Accepting Orientation: : Oriented to Self, Oriented to Place, Oriented to  Time, Oriented to Situation Alcohol / Substance Use: Other (comment) Psych Involvement: No (comment)  Admission diagnosis:  Cough [R05] Supratherapeutic INR [R79.1] Symptomatic anemia [D64.9] Gastrointestinal hemorrhage, unspecified gastrointestinal hemorrhage type [K92.2] Acute anemia [D64.9] Patient Active Problem List   Diagnosis Date Noted  . Pulmonary hypertension ([place]) 12/28/2018  . Acute anemia 12/25/2018  . Hyperlipidemia associated with type 2 diabetes mellitus ([place]) 05/18/2017  . Morbid obesity with BMI of 45.0-49.9, adult ([place]) 05/18/2017  . CKD stage 3 due to type 2 diabetes mellitus ([place]) 09/17/2015  . MGUS (monoclonal gammopathy of unknown significance) 05/27/2015  . Normocytic anemia 11/04/2014  . Peripheral edema   . Essential hypertension   . DM (diabetes mellitus), type 2, uncontrolled, with renal complications ([place]) 99/37/1696  . Dysphagia 04/20/2014  . Atrial fibrillation with slow ventricular response ([place]) 04/14/2014  . AKI (acute kidney injury) ([place]) 04/14/2014  . Anticoagulated on Coumadin 03/20/2014  . Nonischemic cardiomyopathy ([place]) 02/20/2014  . OSA (obstructive sleep apnea)   . Right heart failure ([place]) 02/10/2014  . Morbid obesity ([place]) 08/05/2013  . History of colon cancer   . Gout 05/25/2012  . Hypertensive heart disease    PCP:  [name], [name], MD Pharmacy:   [id number] [place] [address] Phone: [phone] Fax: [phone]    Readmission Risk Interventions No flowsheet data found.

## 2018-12-31 NOTE — Progress Notes (Signed)
Subjective:  Patient has no complaints.  He states he is going to [facility] for rehab. He says he has been eating everything on his tray.  He denies nausea vomiting or abdominal pain.  He had 2 formed stools today.  Objective: Blood pressure 115/67, pulse 92, temperature 98.3 F (36.8 C), temperature source Oral, resp. rate (!) 24, height 5' 4"  (1.626 m), weight 116.5 kg, SpO2 (!) 78 %. Patient is alert. Conjunctiva is pale.  Sclera is nonicteric. Abdomen is obese with small umbilical hernia and long midline scar.  On palpation is soft and nontender. No LE edema noted.   Labs/studies Results:  CBC Latest Ref Rng & Units 12/31/2018 12/30/2018 12/29/2018  WBC 4.0 - 10.5 K/uL 4.8 4.5 4.5  Hemoglobin 13.0 - 17.0 g/dL 7.0(L) 7.6(L) 7.4(L)  Hematocrit 39.0 - 52.0 % 25.5(L) 28.2(L) 26.6(L)  Platelets 150 - 400 K/uL 146(L) 155 147(L)    CMP Latest Ref Rng & Units 12/31/2018 12/29/2018 12/28/2018  Glucose 70 - 99 mg/dL 127(H) 136(H) 123(H)  BUN 8 - 23 mg/dL 26(H) 29(H) 35(H)  Creatinine 0.61 - 1.24 mg/dL 1.37(H) 1.40(H) 1.35(H)  Sodium 135 - 145 mmol/L 136 136 136  Potassium 3.5 - 5.1 mmol/L 4.3 4.2 3.7  Chloride 98 - 111 mmol/L 106 109 108  CO2 22 - 32 mmol/L 21(L) 21(L) 20(L)  Calcium 8.9 - 10.3 mg/dL 8.8(L) 8.3(L) 8.3(L)  Total Protein 6.5 - 8.1 g/dL - - -  Total Bilirubin 0.3 - 1.2 mg/dL - - -  Alkaline Phos 38 - 126 U/L - - -  AST 15 - 41 U/L - - -  ALT 0 - 44 U/L - - -    Hepatic Function Latest Ref Rng & Units 12/25/2018 09/03/2018 05/02/2018  Total Protein 6.5 - 8.1 g/dL 6.4(L) 6.7 6.8  Albumin 3.5 - 5.0 g/dL 3.1(L) 3.9 3.9  AST 15 - 41 U/L 12(L) 19 17  ALT 0 - 44 U/L 10 11 17   Alk Phosphatase 38 - 126 U/L 54 79 66  Total Bilirubin 0.3 - 1.2 mg/dL 1.0 0.5 0.4  Bilirubin, Direct 0.0 - 0.3 mg/dL - - -    INR today is 1.9. Gastric and colonic polyp biopsies pending.   Assessment:  #1.  Iron deficiency anemia.  He presented with profound anemia.  No evidence of  gross GI bleed.  He has received 3 units of PRBCs and to receive fourth unit today. Patient underwent esophagogastroduodenoscopy and colonoscopy on 12/28/2018.  Nonspecific finding of mucosal nodularity had gastric emptying.  He had multiple colonic adenomas 2 of which were covered with speck of blood. Biopsy results are pending.  #2.  Possible diabetic gastroparesis.  Patient was noted to have large stomach containing 700 of bilious fluid.  He was begun on low-dose metoclopramide.  He is reportedly eating all of his meals.  #3.  Chronic atrial fibrillation.  Patient is back on warfarin.  His INR was supratherapeutic on admission.  It is imperative that his INR stays therapeutic otherwise risk of bleeding is significant given that he had 7 polyps removed via hot snare polypectomy.  #4.  History of colon carcinoma.  As above he had multiple polyps removed on his colonoscopy 3 days ago.  He has at least 2 more polyps to be removed.  His prep was suboptimal.  #5. Chronic kidney disease.  Renal function has improved since admission.  #6.  Diabetes mellitus.  Recommendations  Continue metoclopramide at current dose of 5 mg by mouth 30  minutes before each meal. Consider gastric emptying study off therapy when patient has completed rehab. Would recommend checking INR every week for the next 4 weeks or so. I will be contacting patient with biopsy results later this week.

## 2018-12-31 NOTE — Progress Notes (Signed)
ANTICOAGULATION CONSULT NOTE - Initial Up Consult   Pharmacy Consult for warfarin dosing  Indication: atrial fibrillation   Allergies  Allergen Reactions  . Codeine     Headache       Patient Measurements: Last Weight  Most recent update: 12/31/2018  6:13 AM   Weight  116.5 kg (256 lb 13.4 oz)           Body mass index is 44.09 kg/m. William Flores               Temp: 98.1 F (36.7 C) (12/28 1111) Temp Source: Oral (12/28 1111) BP: 121/65 (12/28 1000) Pulse Rate: 93 (12/28 1111)  Labs: Recent Labs    12/29/18 0358 12/30/18 0424 12/31/18 0337  HGB 7.4* 7.6* 7.0*  HCT 26.6* 28.2* 25.5*  PLT 147* 155 146*  LABPROT  --  26.3* 22.1*  INR  --  2.4* 1.9*  CREATININE 1.40*  --  1.37*    Estimated Creatinine Clearance: 61.6 mL/min (A) (by C-G formula based on SCr of 1.37 mg/dL (H)).     Medications:  Medications Prior to Admission  Medication Sig Dispense Refill Last Dose  . allopurinol (ZYLOPRIM) 300 MG tablet TAKE 1 TABLET BY MOUTH EVERY DAY (Patient taking differently: Take 300 mg by mouth daily. ) 90 tablet 1 12/25/2018 at Unknown time  . atorvastatin (LIPITOR) 20 MG tablet 1 tablet daily (Patient taking differently: Take 20 mg by mouth daily. ) 90 tablet 3 12/24/2018 at Unknown time  . azelastine (OPTIVAR) 0.05 % ophthalmic solution PLACE 2 DROPS INTO BOTH EYES 2 (TWO) TIMES DAILY. (Patient taking differently: Place 2 drops into the right eye 2 (two) times daily. ) 6 mL 5 12/25/2018 at Unknown time  . dapagliflozin propanediol (FARXIGA) 5 MG TABS tablet Take 5 mg by mouth daily. 90 tablet 3 12/25/2018 at Unknown time  . diltiazem (CARDIZEM CD) 180 MG 24 hr capsule TAKE 1 CAPSULE BY MOUTH EVERY DAY (Patient taking differently: Take 180 mg by mouth daily. ) 90 capsule 0 12/25/2018 at Unknown time  . furosemide (LASIX) 80 MG tablet TAKE 1 TABLET BY MOUTH IN MORNING AND 1/2 TABLET IN THE EVENING (Patient taking differently: Take 40-80 mg by mouth See admin  instructions. TAKE 1 TABLET BY MOUTH IN MORNING AND 1/2 TABLET IN THE EVENING) 135 tablet 0 12/25/2018 at Unknown time  . losartan (COZAAR) 100 MG tablet Take 100 mg by mouth daily.   12/25/2018 at Unknown time  . metFORMIN (GLUCOPHAGE) 1000 MG tablet TAKE 0.5 TABLETS (500 MG TOTAL) BY MOUTH 2 (TWO) TIMES DAILY WITH A MEAL. 90 tablet 0 12/25/2018 at Unknown time  . metoprolol tartrate (LOPRESSOR) 50 MG tablet Take 1 tablet (50 mg total) by mouth 2 (two) times daily. 180 tablet 3 12/25/2018 at morning  . omeprazole (PRILOSEC) 20 MG capsule Take 1 capsule (20 mg total) by mouth daily. 90 capsule 3 12/25/2018 at Unknown time  . potassium chloride SA (KLOR-CON M20) 20 MEQ tablet TAKE 1/2 TABLET (10 MEQ TOTAL) BY MOUTH DAILY. NEED OV. *INS WILL COVER 4/19 OR LATER* (Patient taking differently: Take 10 mEq by mouth daily. ) 45 tablet 1 12/25/2018 at Unknown time  . TRESIBA FLEXTOUCH 100 UNIT/ML SOPN FlexTouch Pen INJECT 0.26 MLS (26 UNITS TOTAL) INTO THE SKIN DAILY. (Patient taking differently: 26 Units daily. ) 15 pen 1 12/25/2018 at Unknown time  . TRULICITY 1.5 LD/3.5TS SOPN INJECT 1.5 MG INTO THE SKIN ONCE A WEEK. 6 mL 0 Past Week at  Unknown time  . warfarin (COUMADIN) 5 MG tablet TAKE AS DIRECTED (Patient taking differently: Take 5 mg by mouth See admin instructions. 1 tablet on Tuesday and Thursday and 1 and 1/2 tablet on Monday, Wednesday Friday, Saturday and Sunday) 180 tablet 0 12/25/2018 at 0900  . colchicine 0.6 MG tablet Take 2 tablets st first sign of gout flare. May take an additional tablet in 1 hour if needed.  Max of 3 tablets in 24 hours. (Patient not taking: Reported on 12/26/2018) 10 tablet 1 Not Taking at Unknown time  . Insulin Pen Needle 32G X 4 MM MISC Use to give insulin daily Dx E11.29 (Patient not taking: Reported on 12/25/2018) 100 each 3 Not Taking at Unknown time  . polyvinyl alcohol (LIQUIFILM TEARS) 1.4 % ophthalmic solution Place 1 drop into both eyes as needed for dry eyes.  (Patient not taking: Reported on 12/26/2018) 15 mL 0 Not Taking at Unknown time   Scheduled:  . allopurinol  300 mg Oral Daily  . atorvastatin  20 mg Oral q1800  . Chlorhexidine Gluconate Cloth  6 each Topical Daily  . furosemide  80 mg Oral Daily  . insulin aspart  0-15 Units Subcutaneous TID WC & HS  . mouth rinse  15 mL Mouth Rinse BID  . metoCLOPramide  5 mg Oral TID AC  . metoprolol tartrate  50 mg Oral BID  . pantoprazole (PROTONIX) IV  40 mg Intravenous QHS  . Warfarin - Pharmacist Dosing Inpatient   Does not apply q1800   Infusions:  . sodium chloride 20 mL/hr at 12/28/18 1659   PRN: acetaminophen **OR** acetaminophen, ondansetron **OR** ondansetron (ZOFRAN) IV, polyethylene glycol Anti-infectives (From admission, onward)   Start     Dose/Rate Route Frequency Ordered Stop   12/26/18 1900  cefTRIAXone (ROCEPHIN) 2 g in sodium chloride 0.9 % 100 mL IVPB  Status:  Discontinued     2 g 200 mL/hr over 30 Minutes Intravenous Every 24 hours 12/25/18 2317 12/30/18 2053   12/26/18 1900  azithromycin (ZITHROMAX) 500 mg in sodium chloride 0.9 % 250 mL IVPB  Status:  Discontinued     500 mg 250 mL/hr over 60 Minutes Intravenous Every 24 hours 12/25/18 2317 12/25/18 2319   12/25/18 1915  cefTRIAXone (ROCEPHIN) 2 g in sodium chloride 0.9 % 100 mL IVPB     2 g 200 mL/hr over 30 Minutes Intravenous  Once 12/25/18 1913 12/25/18 2010   12/25/18 1915  azithromycin (ZITHROMAX) 500 mg in sodium chloride 0.9 % 250 mL IVPB  Status:  Discontinued     50 0 mg 250 mL/hr over 60 Minutes Intravenous Every 24 hours 12/25/18 1913 12/30/18 2053      Goal of Therapy:  INR 2-3 Monitor platelets by anticoagulation protocol: Yes    Prior to Admission Warfarin Dosing:  William Flores takes 3m and 7.574mpo alternating days prior to admission.    Admit INR was > 10  Lab Results  Component Value Date   INR 1.9 (H) 12/31/2018   INR 2.4 (H) 12/30/2018   INR 1.7 (H) 12/28/2018    Assessment: William Flores [age].o. male requires anticoagulation with warfarin for the indication of  atrial fibrillation. INR was >10 upon admission, William Flores gotten 1077mf vitamin k twice, and 3mg67m/15. Warfarin will be initiated inpatient following pharmacy protocol per pharmacy consult. Patient most recent blood work is as follows: CBC Latest Ref Rng & Units 12/31/2018 12/30/2018 12/29/2018  WBC 4.0 -  10.5 K/uL 4.8 4.5 4.5  Hemoglobin 13.0 - 17.0 g/dL 7.0(L) 7.6(L) 7.4(L)  Hematocrit 39.0 - 52.0 % 25.5(L) 28.2(L) 26.6(L)  Platelets 150 - 400 K/uL 146(L) 155 147(L)   INR jumped from 1.7 to 2.4, will be conservative with today's dose.   Plan: Warfarin 2 mg po x 1 Monitor CBC daily with am labs   Monitor INR daily Monitor for signs and symptoms of bleeding   Margot Ables, PharmD Clinical Pharmacist 12/31/2018 1:40 PM

## 2018-12-31 NOTE — Progress Notes (Signed)
  Nursing Home Choices;   [facility] [address] [place], [place] [address] [phone] Overall rating Much above average 2. 0.4 mi [facility] [address] [place], [place] [address] [phone] Overall rating Below average 3. 10.4 mi [place] [employer], [place] [address] [phone] Overall rating Above average 4. 12.4 mi [place], [place] [address] [phone] Overall rating Average 5. 13.2 mi [facility] and [facility] [place], [place] [address] [phone] Overall rating Much below average 6. 18.1 mi [place] [address] [place], [place] [address] [phone] Overall rating Average 7. 18.2 mi [place] [address] [place], [place] [address] [phone] Overall rating Above average 8. 18.6 mi [facility] [place], [place] [address] [phone] Overall rating Much below average 9. 18.6 mi [facility] and [place], [place] [address] [phone] Overall rating Much below average 10. 19.4 mi [place] [facility] [place], [place] [address] [phone] Overall rating Below average 11. 19.6 mi [place] [facility] [place], [place] [address] [phone] Overall rating Much below average 12. 20.2 [place], [place] [address] [phone] Overall rating Much above average 13. 20.4 [place], [place] [address] [phone] Overall rating Much above average 14. 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 21.3 [place] [address] [place] [address] [phone]

## 2018-12-31 NOTE — Progress Notes (Signed)
Patient is refusing the use of CPAP for tonight. RT informed patient if he changes his mind have RN contact RT.

## 2018-12-31 NOTE — NC FL2 (Signed)
[place] LEVEL OF CARE SCREENING TOOL     IDENTIFICATION  Patient Name: William Flores Birthdate: [birth year] Sex: male Admission Date (Current Location): 12/25/2018  [facility] and [place] Number:  [employer] and Address:  [place] [address] [place]      Provider Number: [id number]  Attending Physician Name and Address:  Roxan Hockey, MD  Relative Name and Phone Number:  Kara Dies  [phone]    Current Level of Care: Hospital Recommended Level of Care: [place] Prior Approval Number:    Date Approved/Denied:   PASRR Number: [id number]  Discharge Plan: SNF    Current Diagnoses: Patient Active Problem List   Diagnosis Date Noted  . Pulmonary hypertension ([place]) 12/28/2018  . Acute anemia 12/25/2018  . Hyperlipidemia associated with type 2 diabetes mellitus ([place]) 05/18/2017  . Morbid obesity with BMI of 45.0-49.9, adult ([place]) 05/18/2017  . CKD stage 3 due to type 2 diabetes mellitus ([place]) 09/17/2015  . MGUS (monoclonal gammopathy of unknown significance) 05/27/2015  . Normocytic anemia 11/04/2014  . Peripheral edema   . Essential hypertension   . DM (diabetes mellitus), type 2, uncontrolled, with renal complications ([place]) 25/00/3704  . Dysphagia 04/20/2014  . Atrial fibrillation with slow ventricular response ([place]) 04/14/2014  . AKI (acute kidney injury) ([place]) 04/14/2014  . Anticoagulated on Coumadin 03/20/2014  . Nonischemic cardiomyopathy ([place]) 02/20/2014  . OSA (obstructive sleep apnea)   . Right heart failure ([place]) 02/10/2014  . Morbid obesity ([place]) 08/05/2013  . History of colon cancer   . Gout 05/25/2012  . Hypertensive heart disease     Orientation RESPIRATION BLADDER Height & Weight     Self, Time, Situation, Place  Normal External catheter Weight: 116.5 kg Height:  5' 4"  (162.6 cm)  BEHAVIORAL SYMPTOMS/MOOD NEUROLOGICAL BOWEL NUTRITION STATUS      Continent Diet(see  Discharge Summary)  AMBULATORY STATUS COMMUNICATION OF NEEDS Skin   Extensive Assist Verbally Other (Comment)([place])                       Personal Care Assistance Level of Assistance  Bathing, Feeding, Dressing Bathing Assistance: Limited assistance Feeding assistance: Independent Dressing Assistance: Limited assistance     Functional Limitations Info  Sight, Speech, Hearing Sight Info: Adequate Hearing Info: Adequate Speech Info: Adequate    SPECIAL CARE FACTORS FREQUENCY  PT (By licensed PT)     PT Frequency: 5 times a week              Contractures Contractures Info: Present    Additional Factors Info  Code Status, Allergies Code Status Info: FULL Allergies Info: codeine           Current Medications (12/31/2018):  This is the current hospital active medication list Current Facility-Administered Medications  Medication Dose Route Frequency Provider Last Rate Last Admin  . 0.9 %  sodium chloride infusion   Intravenous Continuous Rogene Houston, MD 20 mL/hr at 12/28/18 1659 New Bag at 12/28/18 1659  . acetaminophen (TYLENOL) tablet 650 mg  650 mg Oral Q6H PRN Emokpae, Ejiroghene E, MD       Or  . acetaminophen (TYLENOL) suppository 650 mg  650 mg Rectal Q6H PRN Emokpae, Ejiroghene E, MD      . allopurinol (ZYLOPRIM) tablet 300 mg  300 mg Oral Daily Emokpae, Ejiroghene E, MD   300 mg at 12/31/18 0937  . atorvastatin (LIPITOR) tablet 20 mg  20  mg Oral q1800 Emokpae, Ejiroghene E, MD   20 mg at 12/30/18 1709  . Chlorhexidine Gluconate Cloth 2 % PADS 6 each  6 each Topical Daily Kathie Dike, MD   6 each at 12/31/18 1045  . furosemide (LASIX) tablet 80 mg  80 mg Oral Daily Kathie Dike, MD   80 mg at 12/31/18 0937  . insulin aspart (novoLOG) injection 0-15 Units  0-15 Units Subcutaneous TID WC & HS Lang Snow, FNP   2 Units at 12/31/18 1154  . MEDLINE mouth rinse  15 mL Mouth Rinse BID Kathie Dike, MD   15 mL at 12/31/18 0937  .  metoCLOPramide (REGLAN) tablet 5 mg  5 mg Oral TID AC Rehman, Najeeb U, MD   5 mg at 12/31/18 1155  . metoprolol tartrate (LOPRESSOR) tablet 50 mg  50 mg Oral BID Kathie Dike, MD   50 mg at 12/31/18 0936  . ondansetron (ZOFRAN) tablet 4 mg  4 mg Oral Q6H PRN Emokpae, Ejiroghene E, MD       Or  . ondansetron (ZOFRAN) injection 4 mg  4 mg Intravenous Q6H PRN Emokpae, Ejiroghene E, MD   4 mg at 12/28/18 1033  . pantoprazole (PROTONIX) injection 40 mg  40 mg Intravenous QHS Emokpae, Ejiroghene E, MD   40 mg at 12/30/18 2203  . polyethylene glycol (MIRALAX / GLYCOLAX) packet 17 g  17 g Oral Daily PRN Emokpae, Ejiroghene E, MD      . Warfarin - Pharmacist Dosing Inpatient   Does not apply q1800 Coffee, [name]   Given at 12/30/18 1718     Discharge Medications: Please see discharge summary for a list of discharge medications.  Relevant Imaging Results:  Relevant Lab Results:   Additional Information SS# [id number]  Boneta Lucks, RN

## 2019-01-01 ENCOUNTER — Other Ambulatory Visit: Payer: Self-pay

## 2019-01-01 DIAGNOSIS — E1122 Type 2 diabetes mellitus with diabetic chronic kidney disease: Secondary | ICD-10-CM

## 2019-01-01 DIAGNOSIS — E1165 Type 2 diabetes mellitus with hyperglycemia: Secondary | ICD-10-CM

## 2019-01-01 DIAGNOSIS — N183 Chronic kidney disease, stage 3 unspecified: Secondary | ICD-10-CM

## 2019-01-01 DIAGNOSIS — E1129 Type 2 diabetes mellitus with other diabetic kidney complication: Secondary | ICD-10-CM

## 2019-01-01 DIAGNOSIS — I4891 Unspecified atrial fibrillation: Secondary | ICD-10-CM

## 2019-01-01 LAB — CBC
HCT: 26.8 % — ABNORMAL LOW (ref 39.0–52.0)
Hemoglobin: 7.7 g/dL — ABNORMAL LOW (ref 13.0–17.0)
MCH: 26 pg (ref 26.0–34.0)
MCHC: 28.7 g/dL — ABNORMAL LOW (ref 30.0–36.0)
MCV: 90.5 fL (ref 80.0–100.0)
Platelets: 135 10*3/uL — ABNORMAL LOW (ref 150–400)
RBC: 2.96 MIL/uL — ABNORMAL LOW (ref 4.22–5.81)
RDW: 21 % — ABNORMAL HIGH (ref 11.5–15.5)
WBC: 5.6 10*3/uL (ref 4.0–10.5)
nRBC: 0.7 % — ABNORMAL HIGH (ref 0.0–0.2)

## 2019-01-01 LAB — BPAM RBC
Blood Product Expiration Date: 202102022359
ISSUE DATE / TIME: 202012281731
Unit Type and Rh: 5100

## 2019-01-01 LAB — TYPE AND SCREEN
ABO/RH(D): O POS
Antibody Screen: NEGATIVE
Unit division: 0

## 2019-01-01 LAB — BASIC METABOLIC PANEL
Anion gap: 8 (ref 5–15)
BUN: 26 mg/dL — ABNORMAL HIGH (ref 8–23)
CO2: 21 mmol/L — ABNORMAL LOW (ref 22–32)
Calcium: 8.8 mg/dL — ABNORMAL LOW (ref 8.9–10.3)
Chloride: 111 mmol/L (ref 98–111)
Creatinine, Ser: 1.31 mg/dL — ABNORMAL HIGH (ref 0.61–1.24)
GFR calc Af Amer: >60 mL/min (ref >60)
GFR calc non Af Amer: 56 mL/min — ABNORMAL LOW (ref >60)
Glucose, Bld: 127 mg/dL — ABNORMAL HIGH (ref 70–99)
Potassium: 4 mmol/L (ref 3.5–5.1)
Sodium: 140 mmol/L (ref 135–145)

## 2019-01-01 LAB — PROTIME-INR
INR: 1.6 — ABNORMAL HIGH (ref 0.8–1.2)
Prothrombin Time: 19 seconds — ABNORMAL HIGH (ref 11.4–15.2)

## 2019-01-01 LAB — GLUCOSE, CAPILLARY
Glucose-Capillary: 120 mg/dL — ABNORMAL HIGH (ref 70–99)
Glucose-Capillary: 173 mg/dL — ABNORMAL HIGH (ref 70–99)

## 2019-01-01 LAB — SURGICAL PATHOLOGY

## 2019-01-01 MED ORDER — WARFARIN SODIUM 5 MG PO TABS: 5.0000 mg | ORAL_TABLET | Freq: Every evening | ORAL | 3 refills | Status: DC

## 2019-01-01 MED ORDER — PANTOPRAZOLE SODIUM 40 MG PO TBEC: 40.0000 mg | DELAYED_RELEASE_TABLET | Freq: Every day | ORAL | 5 refills | Status: DC

## 2019-01-01 MED ORDER — POLYVINYL ALCOHOL 1.4 % OP SOLN: 1.0000 [drp] | OPHTHALMIC | 0 refills | Status: DC | PRN

## 2019-01-01 MED ORDER — ACETAMINOPHEN 325 MG PO TABS: 650.0000 mg | ORAL_TABLET | Freq: Four times a day (QID) | ORAL | 0 refills | Status: DC | PRN

## 2019-01-01 MED ORDER — FUROSEMIDE 80 MG PO TABS: 80.0000 mg | ORAL_TABLET | Freq: Every day | ORAL | 2 refills | Status: DC

## 2019-01-01 MED ORDER — TRESIBA FLEXTOUCH 100 UNIT/ML ~~LOC~~ SOPN: 18.0000 [IU] | PEN_INJECTOR | Freq: Every day | SUBCUTANEOUS | 1 refills | Status: DC

## 2019-01-01 MED ORDER — WARFARIN SODIUM 5 MG PO TABS: 5.0000 mg | ORAL_TABLET | Freq: Once | ORAL | Status: DC

## 2019-01-01 MED ORDER — INSULIN ASPART 100 UNIT/ML ~~LOC~~ SOLN: SUBCUTANEOUS | 3 refills | Status: DC

## 2019-01-01 MED ORDER — POLYETHYLENE GLYCOL 3350 17 G PO PACK: 17.0000 g | PACK | Freq: Every day | ORAL | 0 refills | Status: DC

## 2019-01-01 NOTE — Progress Notes (Addendum)
Patient alert and oriented x4. No complaints of pain, shortness of breath, chest pain, dizziness, nausea or vomiting. Patient up out of bed to chair and commode with stand-by assist. Gait steady. Patient tolerated PO meds and diet well. Appetite good. IV's removed with no complications noted. Nurse to Nurse report called to Mariann Laster (Nurse at [facility]). Discharge instructions went over with patient and sent with EMS for [facility] [place]. Patient discharged with all belongings to [facility] via EMS.

## 2019-01-01 NOTE — Progress Notes (Signed)
ANTICOAGULATION CONSULT NOTE -    Pharmacy Consult for warfarin dosing  Indication: atrial fibrillation   Allergies  Allergen Reactions  . Codeine     Headache       Patient Measurements: Last Weight  Most recent update: 12/31/2018  6:13 AM   Weight  116.5 kg (256 lb 13.4 oz)           Body mass index is 44.09 kg/m. William Flores               Temp: 98.2 F (36.8 C) (12/29 0716) Temp Source: Oral (12/29 0716) BP: 124/57 (12/29 0400) Pulse Rate: 86 (12/29 0716)  Labs: Recent Labs    12/30/18 0424 12/31/18 0337 01/01/19 0408  HGB 7.6* 7.0* 7.7*  HCT 28.2* 25.5* 26.8*  PLT 155 146* 135*  LABPROT 26.3* 22.1* 19.0*  INR 2.4* 1.9* 1.6*  CREATININE  --  1.37* 1.31*    Estimated Creatinine Clearance: 64.4 mL/min (A) (by C-G formula based on SCr of 1.31 mg/dL (H)).     Medications:  Medications Prior to Admission  Medication Sig Dispense Refill Last Dose  . allopurinol (ZYLOPRIM) 300 MG tablet TAKE 1 TABLET BY MOUTH EVERY DAY (Patient taking differently: Take 300 mg by mouth daily. ) 90 tablet 1 12/25/2018 at Unknown time  . atorvastatin (LIPITOR) 20 MG tablet 1 tablet daily (Patient taking differently: Take 20 mg by mouth daily. ) 90 tablet 3 12/24/2018 at Unknown time  . azelastine (OPTIVAR) 0.05 % ophthalmic solution PLACE 2 DROPS INTO BOTH EYES 2 (TWO) TIMES DAILY. (Patient taking differently: Place 2 drops into the right eye 2 (two) times daily. ) 6 mL 5 12/25/2018 at Unknown time  . dapagliflozin propanediol (FARXIGA) 5 MG TABS tablet Take 5 mg by mouth daily. 90 tablet 3 12/25/2018 at Unknown time  . diltiazem (CARDIZEM CD) 180 MG 24 hr capsule TAKE 1 CAPSULE BY MOUTH EVERY DAY (Patient taking differently: Take 180 mg by mouth daily. ) 90 capsule 0 12/25/2018 at Unknown time  . furosemide (LASIX) 80 MG tablet TAKE 1 TABLET BY MOUTH IN MORNING AND 1/2 TABLET IN THE EVENING (Patient taking differently: Take 40-80 mg by mouth See admin instructions. TAKE 1 TABLET  BY MOUTH IN MORNING AND 1/2 TABLET IN THE EVENING) 135 tablet 0 12/25/2018 at Unknown time  . losartan (COZAAR) 100 MG tablet Take 100 mg by mouth daily.   12/25/2018 at Unknown time  . metFORMIN (GLUCOPHAGE) 1000 MG tablet TAKE 0.5 TABLETS (500 MG TOTAL) BY MOUTH 2 (TWO) TIMES DAILY WITH A MEAL. 90 tablet 0 12/25/2018 at Unknown time  . metoprolol tartrate (LOPRESSOR) 50 MG tablet Take 1 tablet (50 mg total) by mouth 2 (two) times daily. 180 tablet 3 12/25/2018 at morning  . omeprazole (PRILOSEC) 20 MG capsule Take 1 capsule (20 mg total) by mouth daily. 90 capsule 3 12/25/2018 at Unknown time  . potassium chloride SA (KLOR-CON M20) 20 MEQ tablet TAKE 1/2 TABLET (10 MEQ TOTAL) BY MOUTH DAILY. NEED OV. *INS WILL COVER 4/19 OR LATER* (Patient taking differently: Take 10 mEq by mouth daily. ) 45 tablet 1 12/25/2018 at Unknown time  . TRESIBA FLEXTOUCH 100 UNIT/ML SOPN FlexTouch Pen INJECT 0.26 MLS (26 UNITS TOTAL) INTO THE SKIN DAILY. (Patient taking differently: 26 Units daily. ) 15 pen 1 12/25/2018 at Unknown time  . TRULICITY 1.5 HC/6.2BJ SOPN INJECT 1.5 MG INTO THE SKIN ONCE A WEEK. 6 mL 0 Past Week at Unknown time  . warfarin (COUMADIN)  5 MG tablet TAKE AS DIRECTED (Patient taking differently: Take 5 mg by mouth See admin instructions. 1 tablet on Tuesday and Thursday and 1 and 1/2 tablet on Monday, Wednesday Friday, Saturday and Sunday) 180 tablet 0 12/25/2018 at 0900  . colchicine 0.6 MG tablet Take 2 tablets st first sign of gout flare. May take an additional tablet in 1 hour if needed.  Max of 3 tablets in 24 hours. (Patient not taking: Reported on 12/26/2018) 10 tablet 1 Not Taking at Unknown time  . Insulin Pen Needle 32G X 4 MM MISC Use to give insulin daily Dx E11.29 (Patient not taking: Reported on 12/25/2018) 100 each 3 Not Taking at Unknown time  . polyvinyl alcohol (LIQUIFILM TEARS) 1.4 % ophthalmic solution Place 1 drop into both eyes as needed for dry eyes. (Patient not taking: Reported  on 12/26/2018) 15 mL 0 Not Taking at Unknown time   Scheduled:  . allopurinol  300 mg Oral Daily  . atorvastatin  20 mg Oral q1800  . Chlorhexidine Gluconate Cloth  6 each Topical Daily  . furosemide  80 mg Oral Daily  . insulin aspart  0-15 Units Subcutaneous TID WC & HS  . mouth rinse  15 mL Mouth Rinse BID  . metoCLOPramide  5 mg Oral TID AC  . metoprolol tartrate  50 mg Oral BID  . pantoprazole  40 mg Oral BID AC  . Warfarin - Pharmacist Dosing Inpatient   Does not apply q1800   Infusions:   PRN: acetaminophen **OR** acetaminophen, ondansetron **OR** ondansetron (ZOFRAN) IV, polyethylene glycol Anti-infectives (From admission, onward)   Start     Dose/Rate Route Frequency Ordered Stop   12/26/18 1900  cefTRIAXone (ROCEPHIN) 2 g in sodium chloride 0.9 % 100 mL IVPB  Status:  Discontinued     2 g 200 mL/hr over 30 Minutes Intravenous Every 24 hours 12/25/18 2317 12/30/18 2053   12/26/18 1900  azithromycin (ZITHROMAX) 500 mg in sodium chloride 0.9 % 250 mL IVPB  Status:  Discontinued     500 mg 250 mL/hr over 60 Minutes Intravenous Every 24 hours 12/25/18 2317 12/25/18 2319   12/25/18 1915  cefTRIAXone (ROCEPHIN) 2 g in sodium chloride 0.9 % 100 mL IVPB     2 g 200 mL/hr over 30 Minutes Intravenous  Once 12/25/18 1913 12/25/18 2010   12/25/18 1915  azithromycin (ZITHROMAX) 500 mg in sodium chloride 0.9 % 250 mL IVPB  Status:  Discontinued     50 0 mg 250 mL/hr over 60 Minutes Intravenous Every 24 hours 12/25/18 1913 12/30/18 2053      Goal of Therapy:  INR 2-3 Monitor platelets by anticoagulation protocol: Yes    Prior to Admission Warfarin Dosing:  [name] takes 32m and 7.564mpo alternating days prior to admission.    Admit INR was > 10  Lab Results  Component Value Date   INR 1.6 (H) 01/01/2019   INR 1.9 (H) 12/31/2018   INR 2.4 (H) 12/30/2018    Assessment: William Flores 89.o. male requires anticoagulation with warfarin for the indication of  atrial  fibrillation. INR was >10 upon admission, Mr GuCoronaas gotten 109mf vitamin k twice, and 3mg88m/15. Warfarin will be initiated inpatient following pharmacy protocol per pharmacy consult. Patient most recent blood work is as follows: CBC Latest Ref Rng & Units 01/01/2019 12/31/2018 12/30/2018  WBC 4.0 - 10.5 K/uL 5.6 4.8 4.5  Hemoglobin 13.0 - 17.0 g/dL 7.7(L) 7.0(L) 7.6(L)  Hematocrit 39.0 - 52.0 % 26.8(L) 25.5(L) 28.2(L)  Platelets 150 - 400 K/uL 135(L) 146(L) 155   INR 1.6  Plan: Warfarin 5 mg po x 1 Monitor CBC daily with am labs   Monitor INR daily Monitor for signs and symptoms of bleeding   Margot Ables, PharmD Clinical Pharmacist 01/01/2019 7:46 AM

## 2019-01-01 NOTE — Discharge Summary (Signed)
William Flores, is a 68 y.o. male  DOB [birth year]  MRN [id number].  Admission date:  12/25/2018  Admitting Physician  Bethena Roys, MD  Discharge Date:  01/01/2019   Primary MD  [name], [name], MD  Recommendations for primary care physician for things to follow:   1) please change Coumadin/warfarin to 5 mg daily--- recheck CBC and PT/INR blood test around Monday, January 07, 2019 2) you are taking warfarin/Coumadin for blood thinner so Avoid ibuprofen/Advil/Aleve/Motrin/Goody Powders/Naproxen/BC powders/Meloxicam/Diclofenac/Indomethacin and other Nonsteroidal anti-inflammatory medications as these will make you more likely to bleed and can cause stomach ulcers, can also cause Kidney problems.  3) your diabetic medications including your insulin regimen have been adjusted to avoid low blood sugars 4) follow-up with your primary care physician for possible referral to repeat sleep study to continue with CPAP machine 5)  5)you have obstructive sleep apnea with pulmonary hypertension and right-sided heart failure concerns--you have to use a CPAP machine every night to reduce the risk for further right-sided heart failure issues 6) NovoLog sliding scale as below---  -insulin aspart (novoLOG) injection 0-9 Units 0-9 Units,  Subcutaneous, 3 times daily with meals, CBG < 70: Implement Hypoglycemia Standing Orders and refer to Hypoglycemia Standing Orders sidebar report  CBG 70 - 120: 0 units  CBG 121 - 150: 1 unit  CBG 151 - 200: 2 units  CBG 201 - 250: 3 units  CBG 251 - 300: 5 units  CBG 301 - 350: 7 units  CBG 351 - 400: 9 units CBG > 400  Admission Diagnosis  Cough [R05] Supratherapeutic INR [R79.1] Symptomatic anemia [D64.9] Gastrointestinal hemorrhage, unspecified gastrointestinal hemorrhage type [K92.2] Acute anemia [D64.9]   Discharge Diagnosis  Cough [R05] Supratherapeutic INR  [R79.1] Symptomatic anemia [D64.9] Gastrointestinal hemorrhage, unspecified gastrointestinal hemorrhage type [K92.2] Acute anemia [D64.9]    Active Problems:   AKI (acute kidney injury) ([place])   Acute anemia   Atrial fibrillation with slow ventricular response (HCC)   DM (diabetes mellitus), type 2, uncontrolled, with renal complications (HCC)   History of colon cancer   Morbid obesity (HCC)   OSA (obstructive sleep apnea)   Essential hypertension   CKD stage 3 due to type 2 diabetes mellitus ([place])   Pulmonary hypertension ([place])      Past Medical History:  Diagnosis Date  . CAD (coronary artery disease)    LHC 2/08:  mRCA 50%, o/w no sig CAD, EF 25%  . Cardiomyopathy ([place])    EF 35-40% in 2/16 in setting of AF with RVR >> echo 5/16 with improved LVF with EF 65-70%  . Chronic atrial fibrillation ([place])   . Chronic diastolic heart failure (HCC)    HFPF - EF ~65-70% (OFTEN EXACERBATED BY AFIB)  . CKD (chronic kidney disease)    hx of worsening renal failure and hyperK+ in setting of acute diast CHF >> required CVVHD in 4/16 and dialysis x 1 in 5/16  . CKD stage 3 due to type 2 diabetes mellitus ([place]) 09/17/2015  .  Colon cancer ([place])   . Colon polyps 09/21/2012   Tubular adenoma  . Diabetes ([place])   . History of echocardiogram    Echo 05/16/14:  mild LVH, vigorous LVF, EF 65-70%, no RWMA, mean AV gradient 9 mmHg, MAC, mild MR, mod LAE, mild RVE, mild reduced RVF, severe RAE  . Hyperlipidemia   . Hypertension   . Obesity hypoventilation syndrome ([place])   . Renal insufficiency   . Sleep apnea     Past Surgical History:  Procedure Laterality Date  . CIRCUMCISION    . COLON RESECTION    . COLONOSCOPY N/A 09/21/2012   Procedure: COLONOSCOPY;  Surgeon: Inda Castle, MD;  Location: [facility];  Service: Endoscopy;  Laterality: N/A;  . COLONOSCOPY N/A 12/28/2018   Procedure: COLONOSCOPY;  Surgeon: Rogene Houston, MD;  Location: [facility];  Service: Endoscopy;  Laterality:  N/A;  . ESOPHAGOGASTRODUODENOSCOPY N/A 12/28/2018   Procedure: ESOPHAGOGASTRODUODENOSCOPY (EGD);  Surgeon: Rogene Houston, MD;  Location: [facility];  Service: Endoscopy;  Laterality: N/A;  . US ECHOCARDIOGRAPHY  03/04/2010   abnormal study       HPI  from the history and physical done on the day of admission:    Chief Complaint: Generalized weakness, dizziness, cough.  HPI: [name] is a 68 y.o. male with medical history significant for hypertension, diabetes mellitus, nonischemic cardiomyopathy, CKD 3, atrial fibrillation, colon cancer,MGUS.  On chronic 2 L O2 at night. Patient presented to the ED with complaints of 2 days of generalized weakness and dizziness.  He also reports a cough- productive of whitish sputum.  He denies difficulty breathing.  He reports stable 2 pillow use.  Reports weight loss.  No leg swelling.  Reports some episodes of spitting up without actually vomiting the past 2 days only when he drank water.  He reports he has barely eaten over the past 2 days. He denies NSAID use, no abdominal pain.  Denies urinary symptoms.  No loose stools.  No blood in stools or black stools.  Until 2 days ago he was taking his Lasix daily.  ED Course: Blood pressure initially 97/58 improved to 116/52.  O2 sats initially 85% on room air, improved with his home 2 L nasal cannula to greater than 92%.   Hemoglobin 5.1.  Creatinine elevated 2.3.  Normal lactic acid 1.8.  INR > 10.  Portable chest x-ray-findings compatible with a layering left pleural effusion and airspace disease which could be atelectasis or pneumonia, cardiomegaly and pulmonary vascular congestion.  IV ceftriaxone and azithromycin, Kcentra, IV vitamin K 9m given in ED and 2 units PRBC ordered for transfusion.  Hospitalist to admit for acute symptomatic anemia, acute kidney injury and likely pneumonia with supratherapeutic INR.     Hospital Course:     -Brief Narrative:  68year old male with a history of  hypertension, diabetes, chronic kidney disease stage III, atrial fibrillation, presents with worsening generalized weakness.  Found to have significant anemia with a hemoglobin of 5.1.  Creatinine elevated at 2.3.  INR supratherapeutic at greater than 10 and was reversed with Kcentra.  Chest x-ray indicated possible pneumonia.  He was started on intravenous antibiotics and transfused PRBC.  Admitted for further work-up of anemia including GI evaluation.   Assessment & Plan:   Active Problems:   AKI (acute kidney injury) ([place]   Acute anemia   Atrial fibrillation with slow ventricular response (HCC)   DM (diabetes mellitus), type 2, uncontrolled, with renal complications ([place]  History of colon cancer   Morbid obesity ([place])   OSA (obstructive sleep apnea)   Essential hypertension   CKD stage 3 due to type 2 diabetes mellitus ([place])   Pulmonary hypertension ([place])   1)Acute blood loss anemia likely related to GI bleeding.  Patient reports noticing dark-colored stools intermittently.  He does have a history of colon cancer in the past which was resected.  He has been transfused a total of 4 units of PRBC, hemoglobin down to 7.7,   gastroenterology consulted.  He underwent EGD/colonoscopy with reports noted below.   Since iron panel indicates iron deficiency, patient received iron infusion. -Repeat CBC and PT/INR on January 06, 2018 with   2. Acute respiratory failure with hypoxia secondary to community-acquired pneumonia.  Initial oxygen saturations 85% on room air, which improved after supplemental oxygen was applied.  Completed Rocephin/azithromycin x7 days.  Wean down oxygen as tolerated -May use oxygen via nasal cannula at 2 L/min continuously at SNF   3. Acute kidney injury.  Baseline creatinine 1.4-1.9.  Creatinine on admission 2.3.  Likely related to hypotension and severe anemia.  Creatinine has improved back to baseline (1.31)   4)UTI.  Urine culture positive for staph  epidermidis.  Completed 7 days of IV antibiotics as above in #2  5)History of nonischemic cardiomyopathy.  Chest x-ray shows pulmonary vascular congestion with layering pleural effusion.  Repeat echocardiogram shows normal ejection fraction, but increased pulmonary pressures and right-sided heart failure.  Pulmonary pressures noted to be at 76 mmHg.  This is likely secondary to his uncontrolled sleep apnea.   --- Continue  Lasix  6)Supratherapeutic INR--- on admission INR was over 10,  Patient was taking warfarin.  This was reversed with Kcentra.  Pharmacy consult for Coumadin management appreciated -INR is down to 1.6, discharged on Coumadin 5 mg daily, (PTA patient was alternating 5 mg and 7.5 mg)--- repeat PT/INR and CBC on January 07, 2019  7)Chronic atrial fibrillation.  Continue metoprolol 50 mg twice a day for rate control,.  Heart rate is stable.  Coumadin  management as above in #6   8)Diabetes.  Continue to follow blood sugars.  Adjust the doses of Trulicity, Metformin,and Tresiba. -A1c is 4.9, patient is at risk for hypoglycemia -Stop Wilder Glade, reduce Tyler Aas    DVT prophylaxis: SCDs, warfarin Code Status: Full code Family Communication: Discussed with patient Disposition Plan: Discharge to SNF rehab at [facility] on 01/01/2019 pending bed availability and negative repeat Covid test  Consultants:   Gastroenterology  Procedures:  Colonoscopy:- Preparation of the colon was fair. - The examined portion of the ileum was normal. - One small polyp at the hepatic flexure. Biopsied. - One 4 mm polyp at the hepatic flexure, removed with a cold snare. Complete resection.- Seven 7 to 16 mm polyps at the splenic flexure  and in the transverse colon, removed with a hot snare. Complete resection. Partial retrieval. Clips  (MR  conditional) were placed. - Two polyps not removed becuase of   hyperperistalsis.  EGD:- Normal esophagus. - Z-line regular, 45 cm from the incisors. - Bilious gastric fluid. Fluid aspiration performed. - Nodular mucosa in the gastric body. Biopsied. - Normal duodenal bulb and second portion of the  duodenum.  Antimicrobials:   Azithromycin 12/22 > 12/31/18 Ceftriaxone 12/22>    Discharge Condition: stable  Follow UP--- repeat CBC and repeat PT/INR test on January 07, 2019 Diet and Activity recommendation:  As advised  Discharge Instructions     Discharge Instructions  Call MD for:  difficulty breathing, headache or visual disturbances   Complete by: As directed    Call MD for:  persistant dizziness or light-headedness   Complete by: As directed    Call MD for:  persistant nausea and vomiting   Complete by: As directed    Call MD for:  temperature >100.4   Complete by: As directed    Diet - low sodium heart healthy   Complete by: As directed    Diet Carb Modified   Complete by: As directed    Discharge instructions   Complete by: As directed    1) please change Coumadin/warfarin to 5 mg daily--- recheck CBC and PT/INR blood test around Monday, January 07, 2019 2) you are taking warfarin/Coumadin for blood thinner so Avoid ibuprofen/Advil/Aleve/Motrin/Goody Powders/Naproxen/BC powders/Meloxicam/Diclofenac/Indomethacin and other Nonsteroidal anti-inflammatory medications as these will make you more likely to bleed and can cause stomach ulcers, can also cause Kidney problems.  3) your diabetic medications including your insulin regimen have been adjusted to avoid low blood sugars 4) follow-up with your primary care physician for possible referral to repeat sleep study to continue with CPAP  machine 5) 5)you have obstructive sleep apnea with pulmonary hypertension and right-sided heart failure concerns--you have to use a CPAP machine every night to reduce the risk for further right-sided heart failure issues   Increase activity slowly   Complete by: As directed         Discharge Medications     Allergies as of 01/01/2019      Reactions   Codeine    Headache      Medication List    STOP taking these medications   colchicine 0.6 MG tablet   Farxiga 5 MG Tabs tablet Generic drug: dapagliflozin propanediol   omeprazole 20 MG capsule Commonly known as: PRILOSEC     TAKE these medications   acetaminophen 325 MG tablet Commonly known as: TYLENOL Take 2 tablets (650 mg total) by mouth every 6 (six) hours as needed for mild pain (or Fever >/= 101).   allopurinol 300 MG tablet Commonly known as: ZYLOPRIM TAKE 1 TABLET BY MOUTH EVERY DAY   atorvastatin 20 MG tablet Commonly known as: LIPITOR 1 tablet daily What changed:   how much to take  how to take this  when to take this  additional instructions   azelastine 0.05 % ophthalmic solution Commonly known as: OPTIVAR PLACE 2 DROPS INTO BOTH EYES 2 (TWO) TIMES DAILY. What changed: See the new instructions.   diltiazem 180 MG 24 hr capsule Commonly known as: CARDIZEM CD TAKE 1 CAPSULE BY MOUTH EVERY DAY What changed: how much to take   furosemide 80 MG tablet Commonly known as: LASIX Take 1 tablet (80 mg total) by mouth daily. Start taking on: January 02, 2019 What changed: See the new instructions.   insulin aspart 100 UNIT/ML injection Commonly known as: NovoLOG insulin aspart (novoLOG) injection 0-9 Units 0-9 Units, Subcutaneous, 3 times daily with meals, CBG < 70: Implement Hypoglycemia Standing Orders and refer to Hypoglycemia Standing Orders sidebar report  CBG 70 - 120: 0 units CBG 121 - 150: 1 unit CBG 151 - 200: 2 units CBG 201 - 250: 3 units CBG 251 - 300: 5 units CBG 301 - 350: 7 units CBG  351 - 400: 9 units CBG > 400:   Insulin Pen Needle 32G X 4 MM Misc Use to give insulin daily Dx E11.29   losartan 100 MG tablet Commonly known as:  COZAAR Take 100 mg by mouth daily.   metFORMIN 1000 MG tablet Commonly known as: GLUCOPHAGE TAKE 0.5 TABLETS (500 MG TOTAL) BY MOUTH 2 (TWO) TIMES DAILY WITH A MEAL.   metoprolol tartrate 50 MG tablet Commonly known as: LOPRESSOR Take 1 tablet (50 mg total) by mouth 2 (two) times daily.   pantoprazole 40 MG tablet Commonly known as: PROTONIX Take 1 tablet (40 mg total) by mouth daily.   polyethylene glycol 17 g packet Commonly known as: MIRALAX / GLYCOLAX Take 17 g by mouth daily.   polyvinyl alcohol 1.4 % ophthalmic solution Commonly known as: LIQUIFILM TEARS Place 1 drop into both eyes as needed for dry eyes.   potassium chloride SA 20 MEQ tablet Commonly known as: Klor-Con M20 TAKE 1/2 TABLET (10 MEQ TOTAL) BY MOUTH DAILY. NEED OV. *INS WILL COVER 4/19 OR LATER* What changed:   how much to take  how to take this  when to take this  additional instructions   Tresiba FlexTouch 100 UNIT/ML Sopn FlexTouch Pen Generic drug: insulin degludec Inject 0.18 mLs (18 Units total) into the skin at bedtime. What changed: See the new instructions.   Trulicity 1.5 NO/7.0JG Sopn Generic drug: Dulaglutide INJECT 1.5 MG INTO THE SKIN ONCE A WEEK.   warfarin 5 MG tablet Commonly known as: COUMADIN Take as directed. If you are unsure how to take this medication, talk to your nurse or doctor. Original instructions: Take 1 tablet (5 mg total) by mouth every evening. What changed:   how much to take  how to take this  when to take this       Major procedures and Radiology Reports - PLEASE review detailed and final reports for all details, in brief -    CT ABDOMEN PELVIS WO CONTRAST  Result Date: 12/26/2018 CLINICAL DATA:  Acute anemia, elevated INR, exclude intra-abdominal bleeding EXAM: CT ABDOMEN AND PELVIS WITHOUT  CONTRAST TECHNIQUE: Multidetector CT imaging of the abdomen and pelvis was performed following the standard protocol without IV contrast. COMPARISON:  Abdominal ultrasound September 03, 2018 CT abdomen pelvis 02/18/1999 (report only) FINDINGS: Lower chest: Patchy opacity seen in the lingula. Trace left pleural effusion with some adjacent atelectasis. No right effusion. Right lung bases clear. There is cardiomegaly with four-chamber enlargement. Dense mitral annular calcification is noted. Atherosclerotic calcification of the coronary arteries. Hepatobiliary: Distal esophagus, stomach and duodenal sweep are unremarkable. No small bowel wall thickening or dilatation. No evidence of obstruction. Postsurgical changes are present Pancreas: Unremarkable. No pancreatic ductal dilatation or surrounding inflammatory changes. Spleen: Normal in size without focal abnormality. Small accessory splenule Adrenals/Urinary Tract: Normal adrenal glands. Mild bilateral symmetric perinephric stranding, a nonspecific finding though may correlate with either age or decreased renal function. Kidneys are otherwise unremarkable, without renal calculi, suspicious lesion, or hydronephrosis. Bladder is unremarkable. Mild bladder wall thickening with indentation of the bladder base by the borderline enlarged prostate with median lobe hypertrophy. Stomach/Bowel: Distal esophagus, stomach and duodenal sweep are unremarkable. No small bowel wall thickening or dilatation. No evidence of obstruction. Question some postsurgical changes of the proximal colon suggestive of prior partial right hemicolectomy with ileocolic anastomosis. Remaining colonic segments are free of dilatation or wall thickening. Vascular/Lymphatic: Extensive atherosclerotic calcification of the abdominal aorta and branch vessels. No visible aneurysm or ectasia. No suspicious or enlarged lymph nodes in the included lymphatic chains. Reproductive: Prostatomegaly with median lobe  hypertrophy. Few coarse eccentric calcifications are noted. Seminal vesicles are unremarkable. Other: Diffuse mild body wall edema. No retroperitoneal  or intraperitoneal hemorrhage is identified. Postsurgical changes of the low vertical midline abdomen are noted. Small fat containing umbilical hernia is seen. Small fat containing inguinal hernias. No bowel containing hernias. Musculoskeletal: There is diffuse fatty atrophy of the abdominal wall and pelvic musculature. Multilevel degenerative changes are present in the imaged portions of the spine. Multilevel flowing anterior osteophytosis, compatible with features of diffuse idiopathic skeletal hyperostosis (DISH). No acute osseous abnormality or suspicious osseous lesion. IMPRESSION: 1. No evidence of retroperitoneal or intraperitoneal hemorrhage. 2. Cardiomegaly with four-chamber enlargement. Dense mitral annular calcification. 3. Trace left pleural effusion with some adjacent atelectasis. 4. Patchy opacity in the lingula, may represent atelectasis or acute infection/inflammation. 5. Postsurgical changes in the abdomen appear to reflect a right hemicolectomy with patent ileocolic anastomosis. Correlate for surgical history. 6. Mild bilateral symmetric perinephric stranding, a nonspecific finding though may correlate with either age or decreased renal function. 7. Prostatomegaly with median lobe hypertrophy. Mild bladder wall thickening with indentation of the bladder base by the borderline enlarged prostate with median lobe hypertrophy. Correlate for symptoms of outlet obstructing consider urinalysis to exclude superimposed cystitis. 8. Small fat containing umbilical and inguinal hernias. No bowel containing hernias. 9. Aortic Atherosclerosis (ICD10-I70.0). Electronically Signed   By: Lovena Le M.D.   On: 12/26/2018 03:03   XR Chest Single View  Result Date: 12/25/2018 CLINICAL DATA:  Generalized weakness and dizziness for 2 days. EXAM: PORTABLE CHEST 1  VIEW COMPARISON:  Single-view of the chest 05/17/2014. FINDINGS: Hazy opacity over the left chest is consistent with a layering pleural effusion and airspace disease in the mid and lower lung zones. Pleural thickening or small effusion on the right is noted. There is cardiomegaly and pulmonary vascular congestion. Atherosclerosis is noted. No pneumothorax. No acute bony abnormality. IMPRESSION: Findings compatible with a layering left pleural effusion and airspace disease which could be atelectasis or pneumonia. Cardiomegaly and pulmonary vascular congestion. Atherosclerosis. Electronically Signed   By: Inge Rise M.D.   On: 12/25/2018 19:01   ECHOCARDIOGRAM COMPLETE  Result Date: 12/28/2018   ECHOCARDIOGRAM REPORT   Patient Name:   KOLBI ALTADONNA Date of Exam: 12/28/2018 Medical Rec #:  [id number]      Height:       64.0 in Accession #:    [id number]     Weight:       241.8 lb Date of Birth:  [birth year]      BSA:          2.12 m Patient Age:    71 years       BP:           139/81 mmHg Patient Gender: M              HR:           101 bpm. Exam Location:  Inpatient Procedure: 2D Echo and Intracardiac Opacification Agent Indications:    Atrial Fibrillation 427.31 / I48.91  History:        Patient has prior history of Echocardiogram examinations, most                 recent 05/16/2014. Risk Factors:Hypertension, Diabetes and Sleep                 Apnea. Chronic kidney disease, Acute anemia, pneumonia.  Sonographer:    Darlina Sicilian RDCS Referring Phys: [place]  1. Left ventricular ejection fraction, by visual estimation, is 50 to 55%. The left ventricle has normal function. There  is moderately increased left ventricular hypertrophy.  2. Definity contrast agent was given IV to delineate the left ventricular endocardial borders.  3. The left ventricle has no regional wall motion abnormalities.  4. Global right ventricle has moderately reduced systolic function.The right ventricular size  is severely enlarged. Right vetricular wall thickness was not assessed.  5. Left atrial size was normal.  6. Right atrial size was severely dilated.  7. Moderate mitral annular calcification.  8. The mitral valve is degenerative. Mild mitral valve regurgitation.  9. The tricuspid valve is grossly normal. 10. The aortic valve is tricuspid. Aortic valve regurgitation is not visualized. 11. The pulmonic valve was not well visualized. Pulmonic valve regurgitation is not visualized. 12. Severely elevated pulmonary artery systolic pressure. 13. The inferior vena cava is dilated in size with <50% respiratory variability, suggesting right atrial pressure of 15 mmHg. FINDINGS  Left Ventricle: Left ventricular ejection fraction, by visual estimation, is 50 to 55%. The left ventricle has normal function. Definity contrast agent was given IV to delineate the left ventricular endocardial borders. The left ventricle has no regional wall motion abnormalities. There is moderately increased left ventricular hypertrophy. Concentric left ventricular hypertrophy. Right Ventricle: The right ventricular size is severely enlarged. Right vetricular wall thickness was not assessed. Global RV systolic function is has moderately reduced systolic function. The tricuspid regurgitant velocity is 3.93 m/s, and with an assumed right atrial pressure of 15 mmHg, the estimated right ventricular systolic pressure is severely elevated at 76.7 mmHg. Left Atrium: Left atrial size was normal in size. Right Atrium: Right atrial size was severely dilated Pericardium: There is no evidence of pericardial effusion. Mitral Valve: The mitral valve is degenerative in appearance. There is mild thickening of the mitral valve leaflet(s). Moderate mitral annular calcification. Mild mitral valve regurgitation. Tricuspid Valve: The tricuspid valve is grossly normal. Tricuspid valve regurgitation moderate. Aortic Valve: The aortic valve is tricuspid. . There is mild  thickening of the aortic valve. Aortic valve regurgitation is not visualized. Moderate aortic valve annular calcification. There is mild thickening of the aortic valve. Pulmonic Valve: The pulmonic valve was not well visualized. Pulmonic valve regurgitation is not visualized. Pulmonic regurgitation is not visualized. Aorta: The aortic root is normal in size and structure. Venous: The inferior vena cava is dilated in size with less than 50% respiratory variability, suggesting right atrial pressure of 15 mmHg. IAS/Shunts: No atrial level shunt detected by color flow Doppler.  LEFT VENTRICLE PLAX 2D LVIDd:         6.38 cm LVIDs:         4.05 cm LV PW:         1.29 cm LV IVS:        1.37 cm LV SV:         135 ml LV SV Index:   59.09  LV Volumes (MOD) LV area d, A2C:    25.10 cm LV area d, A4C:    32.70 cm LV area s, A2C:    15.70 cm LV area s, A4C:    20.10 cm LV major d, A2C:   6.98 cm LV major d, A4C:   7.19 cm LV major s, A2C:   6.05 cm LV major s, A4C:   6.57 cm LV vol d, MOD A2C: 74.4 ml LV vol d, MOD A4C: 122.0 ml LV vol s, MOD A2C: 34.8 ml LV vol s, MOD A4C: 50.6 ml LV SV MOD A2C:     39.6 ml LV SV MOD  A4C:     122.0 ml LV SV MOD BP:      52.9 ml LEFT ATRIUM         Index      RIGHT ATRIUM           Index LA diam:    5.10 cm 2.40 cm/m RA Area:     28.30 cm                                RA Volume:   98.90 ml  46.63 ml/m  AORTIC VALVE LVOT Vmax:   111.00 cm/s LVOT Vmean:  78.900 cm/s LVOT VTI:    0.211 m  AORTA Ao Root diam: 3.10 cm MITRAL VALVE                        TRICUSPID VALVE MV Area (PHT): 3.60 cm             TR Peak grad:   61.7 mmHg MV PHT:        61.19 msec           TR Vmax:        507.00 cm/s MV Decel Time: 211 msec MV E velocity: 132.00 cm/s 103 cm/s SHUNTS                                     Systemic VTI: 0.21 m  Kate Sable MD Electronically signed by Kate Sable MD Signature Date/Time: 12/28/2018/11:09:18 AM    Final     Micro Results   Recent Results (from the past 240  hour(s))  Culture, Urine     Status: Abnormal   Collection Time: 12/25/18  6:20 PM   Specimen: Urine, Random  Result Value Ref Range Status   Specimen Description   Final    URINE, RANDOM Performed at [facility], [address] [place], [place] [address]    Special Requests   Final    NONE Performed at [facility], [address] [place], [place] [address]    Culture >=100,000 COLONIES/mL STAPHYLOCOCCUS EPIDERMIDIS (A)  Final   Report Status 12/28/2018 FINAL  Final   Organism ID, Bacteria STAPHYLOCOCCUS EPIDERMIDIS (A)  Final      Susceptibility   Staphylococcus epidermidis - MIC*    CIPROFLOXACIN <=0.5 SENSITIVE Sensitive     GENTAMICIN <=0.5 SENSITIVE Sensitive     NITROFURANTOIN <=16 SENSITIVE Sensitive     OXACILLIN <=0.25 SENSITIVE Sensitive     TETRACYCLINE 2 SENSITIVE Sensitive     VANCOMYCIN 1 SENSITIVE Sensitive     TRIMETH/SULFA <=10 SENSITIVE Sensitive     CLINDAMYCIN <=0.25 SENSITIVE Sensitive     RIFAMPIN <=0.5 SENSITIVE Sensitive     Inducible Clindamycin NEGATIVE Sensitive     * >=100,000 COLONIES/mL STAPHYLOCOCCUS EPIDERMIDIS  Blood Culture x 1     Status: None   Collection Time: 12/25/18  7:03 PM   Specimen: BLOOD  Result Value Ref Range Status   Specimen Description BLOOD LEFT ANTECUBITAL  Final   Special Requests   Final    BOTTLES DRAWN AEROBIC AND ANAEROBIC Blood Culture adequate volume   Culture   Final    NO GROWTH 5 DAYS Performed at [facility], [address], [place],  [address]    Report Status 12/30/2018 FINAL  Final  SARS CORONAVIRUS 2 (TAT  6-24 HRS) Nasopharyngeal Nasopharyngeal Swab     Status: None   Collection Time: 12/25/18  8:32 PM   Specimen: Nasopharyngeal Swab  Result Value Ref Range Status   SARS Coronavirus 2 NEGATIVE NEGATIVE Final    Comment: (NOTE) SARS-CoV-2 target nucleic acids are NOT DETECTED. The SARS-CoV-2 RNA is generally detectable in upper and lower respiratory specimens during the acute phase of  infection. Negative results do not preclude SARS-CoV-2 infection, do not rule out co-infections with other pathogens, and should not be used as the sole basis for treatment or other patient management decisions. Negative results must be combined with clinical observations, patient history, and epidemiological information. The expected result is Negative. Fact Sheet for Patients: SugarRoll.be Fact Sheet for Healthcare Providers: https://www.woods-mathews.com/ This test is not yet approved or cleared by the [place] FDA and  has been authorized for detection and/or diagnosis of SARS-CoV-2 by FDA under an Emergency Use Authorization (EUA). This EUA will remain  in effect (meaning this test can be used) for the duration of the COVID-19 declaration under Section 56 4(b)(1) of the Act, 21 U.S.C. section 360bbb-3(b)(1), unless the authorization is terminated or revoked sooner. Performed at [facility], [place] [address] [place], [place] [address]   MRSA PCR Screening     Status: None   Collection Time: 12/26/18  4:47 PM   Specimen: Nasopharyngeal  Result Value Ref Range Status   MRSA by PCR NEGATIVE NEGATIVE Final    Comment:        The GeneXpert MRSA Assay (FDA approved for NASAL specimens only), is one component of a comprehensive MRSA colonization surveillance program. It is not intended to diagnose MRSA infection nor to guide or monitor treatment for MRSA infections. Performed at [facility], [address], [place], [place] [address]   SARS CORONAVIRUS 2 (TAT 6-24 HRS) Nasopharyngeal Nasopharyngeal Swab     Status: None   Collection Time: 12/31/18  1:34 PM   Specimen: Nasopharyngeal Swab  Result Value Ref Range Status   SARS Coronavirus 2 NEGATIVE NEGATIVE Final    Comment: (NOTE) SARS-CoV-2 target nucleic acids are NOT DETECTED. The SARS-CoV-2 RNA is generally detectable in upper and lower respiratory specimens during the  acute phase of infection. Negative results do not preclude SARS-CoV-2 infection, do not rule out co-infections with other pathogens, and should not be used as the sole basis for treatment or other patient management decisions. Negative results must be combined with clinical observations, patient history, and epidemiological information. The expected result is Negative. Fact Sheet for Patients: SugarRoll.be Fact Sheet for Healthcare Providers: https://www.woods-mathews.com/ This test is not yet approved or cleared by the [place] FDA and  has been authorized for detection and/or diagnosis of SARS-CoV-2 by FDA under an Emergency Use Authorization (EUA). This EUA will remain  in effect (meaning this test can be used) for the duration of the COVID-19 declaration under Section 56 4(b)(1) of the Act, 21 U.S.C. section 360bbb-3(b)(1), unless the authorization is terminated or revoked sooner. Performed at [facility], [place] [address], [place], [place] [address]        Today   Subjective    Jibran Crookshanks today has no new complaints, -No chest pains, no significant dyspnea at rest,        Patient has been seen and examined prior to discharge   Objective   Blood pressure (!) 112/99, pulse 89, temperature 97.9 F (36.6 C), temperature source Oral, resp. rate (!) 24, height _0  (1.626  m), weight 116.5 kg, SpO2 99 %.   Intake/Output Summary (Last 24 hours) at 01/01/2019 1154 Last data filed at 01/01/2019 0518 Gross per 24 hour  Intake 724 ml  Output 1600 ml  Net -876 ml    Exam Gen:- Awake Alert, morbidly obese, no acute distress, speaking in complete sentences HEENT:- [place].AT, No sclera icterus Neck-Supple Neck,No JVD,.  Lungs-  CTAB , good air movement bilaterally  CV- S1, S2 normal, regular Abd-  +ve B.Sounds, Abd Soft, No tenderness, significantly increased truncal adiposity with reducible nontender umbilical  hernia Extremity/Skin:- No  edema,   good pulses, venous stasis type dermatitis changes  Psych-affect is appropriate, oriented x3 Neuro-no new focal deficits, no tremors    Data Review   CBC w Diff:  Lab Results  Component Value Date   WBC 5.6 01/01/2019   HGB 7.7 (L) 01/01/2019   HGB 14.0 05/02/2018   HCT 26.8 (L) 01/01/2019   HCT 45.1 05/02/2018   PLT 135 (L) 01/01/2019   PLT 149 (L) 05/02/2018   LYMPHOPCT 19 05/10/2016   MONOPCT 9 05/10/2016   EOSPCT 2 05/10/2016   BASOPCT 1 05/10/2016    CMP:  Lab Results  Component Value Date   NA 140 01/01/2019   NA 143 09/03/2018   K 4.0 01/01/2019   CL 111 01/01/2019   CO2 21 (L) 01/01/2019   BUN 26 (H) 01/01/2019   BUN 45 (H) 09/03/2018   CREATININE 1.31 (H) 01/01/2019   CREATININE 1.12 05/25/2012   PROT 6.4 (L) 12/25/2018   PROT 6.7 09/03/2018   ALBUMIN 3.1 (L) 12/25/2018   ALBUMIN 3.9 09/03/2018   BILITOT 1.0 12/25/2018   BILITOT 0.5 09/03/2018   ALKPHOS 54 12/25/2018   AST 12 (L) 12/25/2018   ALT 10 12/25/2018  .   Total Discharge time is about 33 minutes  Roxan Hockey M.D on 01/01/2019 at 11:54 AM  Go to [email or web address] -  for contact info  [facility] - Office  [phone]

## 2019-01-01 NOTE — Progress Notes (Signed)
Physical Therapy Treatment Patient Details Name: William Flores MRN: [id number] DOB: 1950-05-14 Today's Date: 01/01/2019    History of Present Illness 68 year old male with a history of hypertension, diabetes, chronic kidney disease stage III, atrial fibrillation, presents with worsening generalized weakness.  Found to have significant anemia with a hemoglobin of 5.1.  Creatinine elevated at 2.3.  INR supratherapeutic at greater than 10 and was reversed with Kcentra.  Chest x-ray indicated possible pneumonia.  He was started on intravenous antibiotics and transfused PRBC.  Admitted for further work-up of anemia including GI evaluation.    PT Comments    Patient presents seated in chair and agreeable for therapy.  Patient demonstrates good return for completing BLE ROM/strengthening exercises while seated in chair, increased endurance/distance for taking steps at bedside using RW without loss of balance, limited secondary to fatigue and SpO2 dropped from 96% to 80% while on room air and had to put 2 LPM O2 on after therapy.  Patient will benefit from continued physical therapy in hospital and recommended venue below to increase strength, balance, endurance for safe ADLs and gait.   Follow Up Recommendations  SNF;Supervision for mobility/OOB     Equipment Recommendations  3in1 (PT);Rolling walker with 5" wheels    Recommendations for Other Services       Precautions / Restrictions Precautions Precautions: Fall Restrictions Weight Bearing Restrictions: No    Mobility  Bed Mobility               General bed mobility comments: Patient presents seated in chair (assisted by nursing staff)  Transfers Overall transfer level: Needs assistance Equipment used: Rolling walker (2 wheeled) Transfers: Sit to/from Stand;Stand Pivot Transfers Sit to Stand: Supervision Stand pivot transfers: Min guard;Min assist       General transfer comment: slightly unsteady on  feet  Ambulation/Gait Ambulation/Gait assistance: Min guard;Min assist Gait Distance (Feet): 20 Feet Assistive device: Rolling walker (2 wheeled) Gait Pattern/deviations: Decreased step length - right;Decreased step length - left;Decreased stride length Gait velocity: decreased   General Gait Details: slow slightly labored cadence for taking steps at bedside without loss of balance, desaturated from 96% to 80% while on room air   Stairs             Wheelchair Mobility    Modified Rankin (Stroke Patients Only)       Balance Overall balance assessment: Needs assistance Sitting-balance support: Feet supported;No upper extremity supported Sitting balance-Leahy Scale: Good Sitting balance - Comments: seated at bedside and in chair   Standing balance support: During functional activity;Bilateral upper extremity supported Standing balance-Leahy Scale: Fair Standing balance comment: using RW                            Cognition Arousal/Alertness: Awake/alert Behavior During Therapy: WFL for tasks assessed/performed Overall Cognitive Status: Within Functional Limits for tasks assessed                                        Exercises General Exercises - Lower Extremity Long Arc Quad: Seated;AROM;Strengthening;Both;10 reps Hip Flexion/Marching: Seated;AROM;Strengthening;Both;10 reps Toe Raises: Seated;AROM;Strengthening;Both;10 reps Heel Raises: Seated;AROM;Strengthening;Both;10 reps    General Comments        Pertinent Vitals/Pain Pain Assessment: No/denies pain    Home Living  Prior Function            PT Goals (current goals can now be found in the care plan section) Acute Rehab PT Goals Patient Stated Goal: to go home PT Goal Formulation: With patient Time For Goal Achievement: 01/13/19 Potential to Achieve Goals: Good Progress towards PT goals: Progressing toward goals    Frequency    Min  3X/week      PT Plan Current plan remains appropriate    Co-evaluation              AM-PAC PT "6 Clicks" Mobility   Outcome Measure  Help needed turning from your back to your side while in a flat bed without using bedrails?: None Help needed moving from lying on your back to sitting on the side of a flat bed without using bedrails?: A Little Help needed moving to and from a bed to a chair (including a wheelchair)?: A Little Help needed standing up from a chair using your arms (e.g., wheelchair or bedside chair)?: A Little Help needed to walk in hospital room?: A Lot Help needed climbing 3-5 steps with a railing? : A Lot 6 Click Score: 17    End of Session Equipment Utilized During Treatment: Oxygen Activity Tolerance: Patient tolerated treatment well;Patient limited by fatigue Patient left: in bed(seated at bedside) Nurse Communication: Mobility status PT Visit Diagnosis: Unsteadiness on feet (R26.81);Other abnormalities of gait and mobility (R26.89);Muscle weakness (generalized) (M62.81);Difficulty in walking, not elsewhere classified (R26.2)     Time: 8299-3716 PT Time Calculation (min) (ACUTE ONLY): 20 min  Charges:  $Therapeutic Exercise: 8-22 mins $Therapeutic Activity: 8-22 mins                     12:34 PM, 01/01/19 Lonell Grandchild, MPT Physical Therapist with [facility] [phone] office [phone] mobile phone

## 2019-01-01 NOTE — Discharge Instructions (Signed)
1) please change Coumadin/warfarin to 5 mg daily--- recheck CBC and PT/INR blood test around Monday, January 07, 2019 2) you are taking warfarin/Coumadin for blood thinner so Avoid ibuprofen/Advil/Aleve/Motrin/Goody Powders/Naproxen/BC powders/Meloxicam/Diclofenac/Indomethacin and other Nonsteroidal anti-inflammatory medications as these will make you more likely to bleed and can cause stomach ulcers, can also cause Kidney problems.  3) your diabetic medications including your insulin regimen have been adjusted to avoid low blood sugars 4) follow-up with your primary care physician for possible referral to repeat sleep study to continue with CPAP machine 5)  5)you have obstructive sleep apnea with pulmonary hypertension and right-sided heart failure concerns--you have to use a CPAP machine every night to reduce the risk for further right-sided heart failure issues 6) NovoLog sliding scale as below---  -insulin aspart (novoLOG) injection 0-9 Units 0-9 Units,  Subcutaneous, 3 times daily with meals, CBG < 70: Implement Hypoglycemia Standing Orders and refer to Hypoglycemia Standing Orders sidebar report  CBG 70 - 120: 0 units  CBG 121 - 150: 1 unit  CBG 151 - 200: 2 units  CBG 201 - 250: 3 units  CBG 251 - 300: 5 units  CBG 301 - 350: 7 units  CBG 351 - 400: 9 units CBG > 400:

## 2019-01-01 NOTE — TOC Transition Note (Signed)
Transition of Care [facility]) - CM/SW Discharge Note   Patient Details  Name: William Flores MRN: [id number] Date of Birth: 05-Feb-1950  [facility] CM/SW Contact:  Boneta Lucks, RN Phone Number: 01/01/2019, 1:00 PM   Clinical Narrative:   Patient Medically ready, repeat COVID in negative. Mardene Celeste at [facility] ready for the patient. RN update will call report and EMS for transport. Clinical sent in the hub.     Final next level of care: Skilled Nursing Facility Barriers to Discharge: Barriers Resolved   Patient Goals and CMS Choice Patient states their goals for this hospitalization and ongoing recovery are:: to go to SNF then return home. CMS Medicare.gov Compare Post Acute Care list provided to:: Patient Choice offered to / list presented to : Patient  Discharge Placement              Patient chooses bed at: [facility]) Patient to be transferred to facility by: EMS   Patient and family notified of of transfer: 01/01/19  Discharge Plan and Services      Readmission Risk Interventions No flowsheet data found.

## 2019-01-04 DIAGNOSIS — K922 Gastrointestinal hemorrhage, unspecified: Secondary | ICD-10-CM | POA: Diagnosis not present

## 2019-01-04 DIAGNOSIS — N183 Chronic kidney disease, stage 3 unspecified: Secondary | ICD-10-CM | POA: Diagnosis not present

## 2019-01-04 DIAGNOSIS — G4733 Obstructive sleep apnea (adult) (pediatric): Secondary | ICD-10-CM | POA: Diagnosis not present

## 2019-01-04 DIAGNOSIS — D649 Anemia, unspecified: Secondary | ICD-10-CM | POA: Diagnosis not present

## 2019-01-04 DIAGNOSIS — I2510 Atherosclerotic heart disease of native coronary artery without angina pectoris: Secondary | ICD-10-CM | POA: Diagnosis not present

## 2019-01-04 DIAGNOSIS — M6281 Muscle weakness (generalized): Secondary | ICD-10-CM | POA: Diagnosis not present

## 2019-01-04 DIAGNOSIS — I272 Pulmonary hypertension, unspecified: Secondary | ICD-10-CM | POA: Diagnosis not present

## 2019-01-04 DIAGNOSIS — N179 Acute kidney failure, unspecified: Secondary | ICD-10-CM | POA: Diagnosis not present

## 2019-01-04 DIAGNOSIS — I130 Hypertensive heart and chronic kidney disease with heart failure and stage 1 through stage 4 chronic kidney disease, or unspecified chronic kidney disease: Secondary | ICD-10-CM | POA: Diagnosis not present

## 2019-01-04 DIAGNOSIS — I5032 Chronic diastolic (congestive) heart failure: Secondary | ICD-10-CM | POA: Diagnosis not present

## 2019-01-04 DIAGNOSIS — E1122 Type 2 diabetes mellitus with diabetic chronic kidney disease: Secondary | ICD-10-CM | POA: Diagnosis not present

## 2019-01-04 DIAGNOSIS — R41841 Cognitive communication deficit: Secondary | ICD-10-CM | POA: Diagnosis not present

## 2019-01-04 DIAGNOSIS — I4891 Unspecified atrial fibrillation: Secondary | ICD-10-CM | POA: Diagnosis not present

## 2019-01-23 ENCOUNTER — Other Ambulatory Visit: Payer: Self-pay | Admitting: Family Medicine

## 2019-01-24 ENCOUNTER — Telehealth: Payer: Self-pay | Admitting: Family Medicine

## 2019-01-24 NOTE — Telephone Encounter (Signed)
Patients social worker wants to speak with nurse regarding CPAP Machine. Says she was told by the patient that his current CPAP is broken and wants to speak with someone who can help her to get the patient a CPAP machine if possible.

## 2019-01-24 NOTE — Telephone Encounter (Signed)
Needs to schedule patient a follow up appt.

## 2019-01-24 NOTE — Telephone Encounter (Signed)
Appointment scheduled and records requested.

## 2019-01-25 ENCOUNTER — Telehealth: Payer: Self-pay | Admitting: Family Medicine

## 2019-01-25 NOTE — Telephone Encounter (Signed)
William Flores with [facility] aware

## 2019-01-25 NOTE — Telephone Encounter (Signed)
As far as I can see her know I do not see a reason why they would not be able to go out, I do not see that this patient has ever had Covid or anything that would contraindicate him from having physical therapy.

## 2019-01-25 NOTE — Telephone Encounter (Signed)
LMOVM for pt's appt on 02/06/19 to also be about his OSA and needing a new CPAP machine, there has to be documentation of use, benefits from the use, etc before the order can be placed.

## 2019-01-26 ENCOUNTER — Other Ambulatory Visit: Payer: Self-pay | Admitting: Family Medicine

## 2019-01-27 ENCOUNTER — Other Ambulatory Visit: Payer: Self-pay | Admitting: Family Medicine

## 2019-01-28 ENCOUNTER — Other Ambulatory Visit: Payer: Self-pay | Admitting: Family Medicine

## 2019-01-28 MED ORDER — INSULIN ASPART 100 UNIT/ML ~~LOC~~ SOLN: SUBCUTANEOUS | 3 refills | Status: DC

## 2019-01-28 MED ORDER — METOPROLOL TARTRATE 50 MG PO TABS: 50.0000 mg | ORAL_TABLET | Freq: Two times a day (BID) | ORAL | 3 refills | Status: DC

## 2019-01-28 MED ORDER — FUROSEMIDE 80 MG PO TABS: 80.0000 mg | ORAL_TABLET | Freq: Every day | ORAL | 2 refills | Status: DC

## 2019-01-28 MED ORDER — ATORVASTATIN CALCIUM 20 MG PO TABS: ORAL_TABLET | ORAL | 3 refills | Status: DC

## 2019-01-28 MED ORDER — METFORMIN HCL 1000 MG PO TABS: 500.0000 mg | ORAL_TABLET | Freq: Two times a day (BID) | ORAL | 0 refills | Status: DC

## 2019-01-28 MED ORDER — PANTOPRAZOLE SODIUM 40 MG PO TBEC: 40.0000 mg | DELAYED_RELEASE_TABLET | Freq: Every day | ORAL | 5 refills | Status: DC

## 2019-01-28 MED ORDER — POTASSIUM CHLORIDE CRYS ER 20 MEQ PO TBCR: EXTENDED_RELEASE_TABLET | ORAL | 1 refills | Status: DC

## 2019-01-28 MED ORDER — POLYVINYL ALCOHOL 1.4 % OP SOLN: 1.0000 [drp] | OPHTHALMIC | 0 refills | Status: DC | PRN

## 2019-01-28 MED ORDER — TRESIBA FLEXTOUCH 100 UNIT/ML ~~LOC~~ SOPN: 18.0000 [IU] | PEN_INJECTOR | Freq: Every day | SUBCUTANEOUS | 1 refills | Status: DC

## 2019-01-28 MED ORDER — INSULIN PEN NEEDLE 32G X 4 MM MISC: 3 refills | Status: DC

## 2019-01-28 MED ORDER — WARFARIN SODIUM 5 MG PO TABS: 5.0000 mg | ORAL_TABLET | Freq: Every evening | ORAL | 3 refills | Status: DC

## 2019-01-28 MED ORDER — LOSARTAN POTASSIUM 100 MG PO TABS: 100.0000 mg | ORAL_TABLET | Freq: Every day | ORAL | 0 refills | Status: DC

## 2019-01-28 NOTE — Telephone Encounter (Signed)
OV 02/06/19

## 2019-01-28 NOTE — Telephone Encounter (Signed)
Patient has upcoming appt on 02/06/2019.  This encounter will now be closed.

## 2019-02-06 ENCOUNTER — Encounter: Payer: Self-pay | Admitting: Family Medicine

## 2019-02-06 ENCOUNTER — Ambulatory Visit (INDEPENDENT_AMBULATORY_CARE_PROVIDER_SITE_OTHER): Payer: Medicare Other | Admitting: Family Medicine

## 2019-02-06 ENCOUNTER — Other Ambulatory Visit: Payer: Self-pay

## 2019-02-06 VITALS — BP 138/64 | HR 52 | Temp 97.3°F | Ht 64.0 in | Wt 243.4 lb

## 2019-02-06 DIAGNOSIS — K922 Gastrointestinal hemorrhage, unspecified: Secondary | ICD-10-CM | POA: Diagnosis not present

## 2019-02-06 DIAGNOSIS — J439 Emphysema, unspecified: Secondary | ICD-10-CM

## 2019-02-06 DIAGNOSIS — I4891 Unspecified atrial fibrillation: Secondary | ICD-10-CM

## 2019-02-06 DIAGNOSIS — J9611 Chronic respiratory failure with hypoxia: Secondary | ICD-10-CM | POA: Diagnosis not present

## 2019-02-06 DIAGNOSIS — G4733 Obstructive sleep apnea (adult) (pediatric): Secondary | ICD-10-CM

## 2019-02-06 DIAGNOSIS — J189 Pneumonia, unspecified organism: Secondary | ICD-10-CM

## 2019-02-06 DIAGNOSIS — R5383 Other fatigue: Secondary | ICD-10-CM | POA: Diagnosis not present

## 2019-02-06 LAB — COAGUCHEK XS/INR WAIVED
INR: 3.1 — ABNORMAL HIGH (ref 0.9–1.1)
Prothrombin Time: 36.8 s

## 2019-02-06 MED ORDER — METFORMIN HCL 500 MG PO TABS: 500.0000 mg | ORAL_TABLET | Freq: Two times a day (BID) | ORAL | 1 refills | Status: DC

## 2019-02-06 MED ORDER — NOVOLOG FLEXPEN 100 UNIT/ML ~~LOC~~ SOPN: PEN_INJECTOR | SUBCUTANEOUS | 11 refills | Status: DC

## 2019-02-06 NOTE — Progress Notes (Addendum)
BP 138/64   Pulse (!) 52   Temp (!) 97.3 F (36.3 C) (Temporal)   Ht 5' 4"  (1.626 m)   Wt 243 lb 6.4 oz (110.4 kg)   SpO2 100%   BMI 41.78 kg/m    Subjective:   Patient ID: William Flores, male    DOB: 04/19/1950, 69 y.o.   MRN: [id number]  HPI: William Flores is a 69 y.o. male presenting on 02/06/2019 for Hospitalization Follow-up (12/22- [facility]. Nursing home follow up- [facility])   HPI osa Patient has been diagnosed with sleep apnea, patient has been using an oxygen device in the evenings and it has been helping him but had is nonfunctional currently.  In the hospitalization they noted that he has sleep apnea with pulmonary hypertension right-sided heart failure concerns and recommended that he use a CPAP machine.  He is currently not been using it because he does not have 1 currently that is functional.  Had a trilogy for non-invasive ventilator in the past but he says he does not have one functional he needs a new one.  We do have an order from a previous company in 2019 for a new 1 of these devices to create target volume.  Patient needs the ventilator to sustain life and help reduce hospitalizations and blow off CO2.  Patient has history of chronic respiratory failure secondary to COPD.  Hospital follow-up Patient is coming in today for hospital follow-up, he was in the hospital on 12/25/2018 and discharged on 01/01/2019.  Patient was in the hospital for gastrointestinal bleed on Coumadin.  His INR was found to be around 10.  He was feeling dizziness and weakness prior to the hospitalization.  His blood pressure was low initially as well.  He was also found to have pneumonia during the hospitalization and treated for pneumonia.  Since leaving the hospital he says he has a small cough but denies any respiratory issues.  His weakness is improving but he still feels generally weak.  He denies any further bleeding or blood in his stool that is noticed.  He did have a hemoglobin down to 7.7 and  received transfusions.  He underwent an EGD and colonoscopy as well.  He was also found to have some mild AKI.  Relevant past medical, surgical, family and social history reviewed and updated as indicated. Interim medical history since our last visit reviewed. Allergies and medications reviewed and updated.  Review of Systems  Constitutional: Positive for fatigue. Negative for chills and fever.  HENT: Negative for congestion, sinus pressure, sinus pain and sore throat.   Eyes: Negative for discharge.  Respiratory: Positive for cough. Negative for shortness of breath and wheezing.   Cardiovascular: Negative for chest pain and leg swelling.  Gastrointestinal: Negative for abdominal pain, blood in stool, constipation, diarrhea, nausea and vomiting.  Genitourinary: Negative for hematuria.  Musculoskeletal: Negative for back pain and gait problem.  Skin: Negative for rash.  Neurological: Positive for weakness. Negative for dizziness, speech difficulty and numbness.  All other systems reviewed and are negative.   Per HPI unless specifically indicated above   Allergies as of 02/06/2019      Reactions   Codeine    Headache      Medication List       Accurate as of February 06, 2019  2:45 PM. If you have any questions, ask your nurse or doctor.        STOP taking these medications   insulin aspart 100 UNIT/ML  injection Commonly known as: NovoLOG Replaced by: NovoLOG FlexPen 100 UNIT/ML FlexPen Stopped by: [name] Jonas Goh, MD     TAKE these medications   acetaminophen 325 MG tablet Commonly known as: TYLENOL Take 2 tablets (650 mg total) by mouth every 6 (six) hours as needed for mild pain (or Fever >/= 101).   allopurinol 300 MG tablet Commonly known as: ZYLOPRIM TAKE 1 TABLET BY MOUTH EVERY DAY   atorvastatin 20 MG tablet Commonly known as: LIPITOR 1 tablet daily   azelastine 0.05 % ophthalmic solution Commonly known as: OPTIVAR PLACE 2 DROPS INTO BOTH EYES 2 (TWO)  TIMES DAILY. What changed: See the new instructions.   Dilt-XR 180 MG 24 hr capsule Generic drug: diltiazem TAKE 1 CAPSULE BY MOUTH EVERY DAY   furosemide 80 MG tablet Commonly known as: LASIX Take 1 tablet (80 mg total) by mouth daily.   Insulin Pen Needle 32G X 4 MM Misc Use to give insulin daily Dx E11.29   losartan 100 MG tablet Commonly known as: COZAAR Take 1 tablet (100 mg total) by mouth daily.   metFORMIN 500 MG tablet Commonly known as: GLUCOPHAGE Take 1 tablet (500 mg total) by mouth 2 (two) times daily with a meal. What changed:   how much to take  Another medication with the same name was removed. Continue taking this medication, and follow the directions you see here. Changed by: [name] Jeda Pardue, MD   metoprolol tartrate 50 MG tablet Commonly known as: LOPRESSOR Take 1 tablet (50 mg total) by mouth 2 (two) times daily.   NovoLOG FlexPen 100 UNIT/ML FlexPen Generic drug: insulin aspart insulin aspart (novoLOG) injection 0-9 Units 0-9 Units, Subcutaneous, 3 times daily with meals, CBG < 70: Implement Hypoglycemia Standing Orders and refer to Hypoglycemia Standing Orders sidebar report CBG 70 - 120: 0 units CBG 121 - 150: 1 unit CBG 151 - 200: 2 units CBG 201 - 250: 3 units CBG 251 - 300: 5 units CBG 301 - 350: 7 units CBG 351 - 400: 9 units CBG > 400 Replaces: insulin aspart 100 UNIT/ML injection Started by: [name] Ellyce Lafevers, MD   pantoprazole 40 MG tablet Commonly known as: PROTONIX Take 1 tablet (40 mg total) by mouth daily.   polyethylene glycol 17 g packet Commonly known as: MIRALAX / GLYCOLAX Take 17 g by mouth daily.   polyvinyl alcohol 1.4 % ophthalmic solution Commonly known as: LIQUIFILM TEARS Place 1 drop into both eyes as needed for dry eyes.   potassium chloride SA 20 MEQ tablet Commonly known as: Klor-Con M20 TAKE 1/2 TABLET (10 MEQ TOTAL) BY MOUTH DAILY. NEED OV. *INS WILL COVER 4/19 OR LATER*   Tyler Aas FlexTouch 100 UNIT/ML Sopn  FlexTouch Pen Generic drug: insulin degludec Inject 0.18 mLs (18 Units total) into the skin at bedtime.   Trulicity 1.5 XN/2.3FT Sopn Generic drug: Dulaglutide INJECT 1.5 MG INTO THE SKIN ONCE A WEEK.   warfarin 5 MG tablet Commonly known as: COUMADIN Take as directed by the anticoagulation clinic. If you are unsure how to take this medication, talk to your nurse or doctor. Original instructions: Take 1 tablet (5 mg total) by mouth every evening.        Objective:   BP 138/64   Pulse (!) 52   Temp (!) 97.3 F (36.3 C) (Temporal)   Ht 5' 4"  (1.626 m)   Wt 243 lb 6.4 oz (110.4 kg)   SpO2 100%   BMI 41.78 kg/m   Wt Readings  from Last 3 Encounters:  02/06/19 243 lb 6.4 oz (110.4 kg)  12/31/18 256 lb 13.4 oz (116.5 kg)  09/03/18 293 lb (132.9 kg)    Physical Exam Vitals and nursing note reviewed.  Constitutional:      General: He is not in acute distress.    Appearance: He is well-developed. He is not diaphoretic.  Eyes:     General: No scleral icterus.    Conjunctiva/sclera: Conjunctivae normal.  Neck:     Thyroid: No thyromegaly.  Cardiovascular:     Rate and Rhythm: Normal rate and regular rhythm.     Heart sounds: Normal heart sounds. No murmur.  Pulmonary:     Effort: Pulmonary effort is normal. No respiratory distress.     Breath sounds: Normal breath sounds. No wheezing.  Musculoskeletal:     Cervical back: Neck supple.  Lymphadenopathy:     Cervical: No cervical adenopathy.  Skin:    General: Skin is warm and dry.     Findings: No rash.  Neurological:     Mental Status: He is alert and oriented to person, place, and time.     Coordination: Coordination normal.  Psychiatric:        Behavior: Behavior normal.       Assessment & Plan:   Problem List Items Addressed This Visit      Cardiovascular and Mediastinum   Atrial fibrillation with slow ventricular response ([place])   Relevant Orders   CoaguChek XS/INR Waived (Completed)     Respiratory    OSA (obstructive sleep apnea) (Chronic)    Other Visit Diagnoses    Gastrointestinal hemorrhage, unspecified gastrointestinal hemorrhage type    -  Primary   Relevant Orders   CBC with Differential/Platelet (Completed)   CMP14+EGFR (Completed)   Pneumonia of lower lobe due to infectious organism, unspecified laterality       Relevant Orders   CBC with Differential/Platelet (Completed)   CMP14+EGFR (Completed)   Other fatigue       Pulmonary emphysema, unspecified emphysema type (HCC)       Chronic respiratory failure with hypoxia (HCC)          Will check blood counts today and see how he is doing.  He denies any further bleeding.  Likely weakness is still due to recovery and will monitor.  Patient has mild cough but pneumonias otherwise resolved per him.  We will try and get another machine for the patient to help with the sleep apnea as his does not work currently.  Description   decreased to 7.5 mg on Tuesdays and Saturday and 5 mg the rest of the days  INR was 3.1 today (goal is 2-3) Recheck in 4 weeks       Follow up plan: Return in about 4 weeks (around 03/06/2019), or if symptoms worsen or fail to improve, for INR recheck.  Counseling provided for all of the vaccine components Orders Placed This Encounter  Procedures  . CBC with Differential/Platelet  . CMP14+EGFR  . CoaguChek XS/INR [place], MD [facility] 02/06/2019, 2:45 PM

## 2019-02-07 LAB — CBC WITH DIFFERENTIAL/PLATELET
Basophils Absolute: 0 10*3/uL (ref 0.0–0.2)
Basos: 1 %
EOS (ABSOLUTE): 0.1 10*3/uL (ref 0.0–0.4)
Eos: 3 %
Hematocrit: 35.8 % — ABNORMAL LOW (ref 37.5–51.0)
Hemoglobin: 10.7 g/dL — ABNORMAL LOW (ref 13.0–17.7)
Immature Grans (Abs): 0 10*3/uL (ref 0.0–0.1)
Immature Granulocytes: 1 %
Lymphocytes Absolute: 0.5 10*3/uL — ABNORMAL LOW (ref 0.7–3.1)
Lymphs: 14 %
MCH: 26.4 pg — ABNORMAL LOW (ref 26.6–33.0)
MCHC: 29.9 g/dL — ABNORMAL LOW (ref 31.5–35.7)
MCV: 88 fL (ref 79–97)
Monocytes Absolute: 0.5 10*3/uL (ref 0.1–0.9)
Monocytes: 13 %
Neutrophils Absolute: 2.6 10*3/uL (ref 1.4–7.0)
Neutrophils: 68 %
Platelets: 127 10*3/uL — ABNORMAL LOW (ref 150–450)
RBC: 4.05 x10E6/uL — ABNORMAL LOW (ref 4.14–5.80)
RDW: 20.9 % — ABNORMAL HIGH (ref 11.6–15.4)
WBC: 3.8 10*3/uL (ref 3.4–10.8)

## 2019-02-07 LAB — CMP14+EGFR
ALT: 62 IU/L — ABNORMAL HIGH (ref 0–44)
AST: 41 IU/L — ABNORMAL HIGH (ref 0–40)
Albumin/Globulin Ratio: 1.5 (ref 1.2–2.2)
Albumin: 4.1 g/dL (ref 3.8–4.8)
Alkaline Phosphatase: 130 IU/L — ABNORMAL HIGH (ref 39–117)
BUN/Creatinine Ratio: 28 — ABNORMAL HIGH (ref 10–24)
BUN: 44 mg/dL — ABNORMAL HIGH (ref 8–27)
Bilirubin Total: 0.5 mg/dL (ref 0.0–1.2)
CO2: 20 mmol/L (ref 20–29)
Calcium: 9.4 mg/dL (ref 8.6–10.2)
Chloride: 111 mmol/L — ABNORMAL HIGH (ref 96–106)
Creatinine, Ser: 1.58 mg/dL — ABNORMAL HIGH (ref 0.76–1.27)
GFR calc Af Amer: 52 mL/min/{1.73_m2} — ABNORMAL LOW (ref >59)
GFR calc non Af Amer: 45 mL/min/{1.73_m2} — ABNORMAL LOW (ref >59)
Globulin, Total: 2.8 g/dL (ref 1.5–4.5)
Glucose: 150 mg/dL — ABNORMAL HIGH (ref 65–99)
Potassium: 4.6 mmol/L (ref 3.5–5.2)
Sodium: 142 mmol/L (ref 134–144)
Total Protein: 6.9 g/dL (ref 6.0–8.5)

## 2019-02-24 ENCOUNTER — Other Ambulatory Visit: Payer: Self-pay | Admitting: Family Medicine

## 2019-02-26 ENCOUNTER — Ambulatory Visit: Payer: Medicare Other

## 2019-02-26 ENCOUNTER — Telehealth: Payer: Self-pay | Admitting: Family Medicine

## 2019-03-08 ENCOUNTER — Encounter: Payer: Self-pay | Admitting: Family Medicine

## 2019-03-08 ENCOUNTER — Ambulatory Visit (INDEPENDENT_AMBULATORY_CARE_PROVIDER_SITE_OTHER): Payer: Medicare Other | Admitting: Family Medicine

## 2019-03-08 ENCOUNTER — Other Ambulatory Visit: Payer: Self-pay

## 2019-03-08 VITALS — BP 131/79 | HR 64 | Temp 98.9°F | Ht 64.0 in | Wt 243.0 lb

## 2019-03-08 DIAGNOSIS — N179 Acute kidney failure, unspecified: Secondary | ICD-10-CM

## 2019-03-08 DIAGNOSIS — N1832 Chronic kidney disease, stage 3b: Secondary | ICD-10-CM | POA: Diagnosis not present

## 2019-03-08 DIAGNOSIS — D649 Anemia, unspecified: Secondary | ICD-10-CM | POA: Diagnosis not present

## 2019-03-08 DIAGNOSIS — I4891 Unspecified atrial fibrillation: Secondary | ICD-10-CM

## 2019-03-08 LAB — COAGUCHEK XS/INR WAIVED
INR: 3.1 — ABNORMAL HIGH (ref 0.9–1.1)
Prothrombin Time: 36.8 s

## 2019-03-08 MED ORDER — BENZONATATE 100 MG PO CAPS: 100.0000 mg | ORAL_CAPSULE | Freq: Two times a day (BID) | ORAL | 3 refills | Status: DC

## 2019-03-08 NOTE — Progress Notes (Addendum)
BP 131/79   Pulse 64   Temp 98.9 F (37.2 C)   Ht 5' 4"  (1.626 m)   Wt 243 lb (110.2 kg)   SpO2 90%   BMI 41.71 kg/m    Subjective:   Patient ID: William Flores, male    DOB: 1950/03/08, 69 y.o.   MRN: [id number]  HPI: William Flores is a 69 y.o. male presenting on 03/08/2019 for Medical Management of Chronic Issues, Cough, Constipation, and Fatigue   HPI Patient is coming in today because is been having increasing issues with weakness and fatigue and increased cough at night.  He says his sleep apnea machine broke and there try to get a new one and he had oxygen at his home but now is staying with his daughter since leaving the hospital so he is not currently using anything at night and that is where he is having a lot of coughing and breathing issues at nighttime and not sleeping well and then he is tired through the day.  He has been staying in bed most of the time and not getting up and has become increasingly weak since leaving the hospital.  He used to be on 2 L nasal cannula and he also had a ventilator machine through [place] and he just needs a renewal of the order for those.  Patient is now using a walker when he was not before.  Daughter does not want home health because she does not want people coming into her house during Covid times.  Relevant past medical, surgical, family and social history reviewed and updated as indicated. Interim medical history since our last visit reviewed. Allergies and medications reviewed and updated.  Review of Systems  Constitutional: Positive for fatigue. Negative for chills and fever.  Eyes: Negative for visual disturbance.  Respiratory: Positive for cough and shortness of breath. Negative for wheezing.   Cardiovascular: Negative for chest pain and leg swelling.  Musculoskeletal: Negative for back pain and gait problem.  Skin: Negative for rash.  Neurological: Positive for weakness. Negative for dizziness and light-headedness.    Psychiatric/Behavioral: Positive for sleep disturbance.  All other systems reviewed and are negative.   Per HPI unless specifically indicated above   Allergies as of 03/08/2019      Reactions   Codeine    Headache      Medication List       Accurate as of March 08, 2019 11:37 AM. If you have any questions, ask your nurse or doctor.        acetaminophen 325 MG tablet Commonly known as: TYLENOL Take 2 tablets (650 mg total) by mouth every 6 (six) hours as needed for mild pain (or Fever >/= 101).   allopurinol 300 MG tablet Commonly known as: ZYLOPRIM TAKE 1 TABLET BY MOUTH EVERY DAY   atorvastatin 20 MG tablet Commonly known as: LIPITOR 1 tablet daily   azelastine 0.05 % ophthalmic solution Commonly known as: OPTIVAR PLACE 2 DROPS INTO BOTH EYES 2 (TWO) TIMES DAILY. What changed: See the new instructions.   Dilt-XR 180 MG 24 hr capsule Generic drug: diltiazem TAKE 1 CAPSULE BY MOUTH EVERY DAY   ferrous sulfate 325 (65 FE) MG tablet Take 325 mg by mouth in the morning and at bedtime.   furosemide 80 MG tablet Commonly known as: LASIX Take 1 tablet (80 mg total) by mouth daily.   Insulin Pen Needle 32G X 4 MM Misc Use to give insulin daily Dx E11.29  losartan 100 MG tablet Commonly known as: COZAAR Take 1 tablet (100 mg total) by mouth daily.   metFORMIN 500 MG tablet Commonly known as: GLUCOPHAGE Take 1 tablet (500 mg total) by mouth 2 (two) times daily with a meal.   metoprolol tartrate 50 MG tablet Commonly known as: LOPRESSOR Take 1 tablet (50 mg total) by mouth 2 (two) times daily.   NovoLOG FlexPen 100 UNIT/ML FlexPen Generic drug: insulin aspart insulin aspart (novoLOG) injection 0-9 Units 0-9 Units, Subcutaneous, 3 times daily with meals, CBG < 70: Implement Hypoglycemia Standing Orders and refer to Hypoglycemia Standing Orders sidebar report CBG 70 - 120: 0 units CBG 121 - 150: 1 unit CBG 151 - 200: 2 units CBG 201 - 250: 3 units CBG 251 - 300: 5  units CBG 301 - 350: 7 units CBG 351 - 400: 9 units CBG > 400   pantoprazole 40 MG tablet Commonly known as: PROTONIX Take 1 tablet (40 mg total) by mouth daily.   polyethylene glycol 17 g packet Commonly known as: MIRALAX / GLYCOLAX Take 17 g by mouth daily.   polyvinyl alcohol 1.4 % ophthalmic solution Commonly known as: LIQUIFILM TEARS Place 1 drop into both eyes as needed for dry eyes.   potassium chloride SA 20 MEQ tablet Commonly known as: Klor-Con M20 TAKE 1/2 TABLET (10 MEQ TOTAL) BY MOUTH DAILY. NEED OV. *INS WILL COVER 4/19 OR LATER*   Tyler Aas FlexTouch 100 UNIT/ML FlexTouch Pen Generic drug: insulin degludec Inject 0.18 mLs (18 Units total) into the skin at bedtime.   Trulicity 1.5 QM/2.5OI Sopn Generic drug: Dulaglutide INJECT 1.5 MG INTO THE SKIN ONCE A WEEK.   warfarin 5 MG tablet Commonly known as: COUMADIN Take as directed by the anticoagulation clinic. If you are unsure how to take this medication, talk to your nurse or doctor. Original instructions: Take 1 tablet (5 mg total) by mouth every evening.        Objective:   BP 131/79   Pulse 64   Temp 98.9 F (37.2 C)   Ht 5' 4"  (1.626 m)   Wt 243 lb (110.2 kg)   SpO2 90%   BMI 41.71 kg/m   Wt Readings from Last 3 Encounters:  03/08/19 243 lb (110.2 kg)  02/06/19 243 lb 6.4 oz (110.4 kg)  12/31/18 256 lb 13.4 oz (116.5 kg)    Physical Exam Vitals and nursing note reviewed.  Constitutional:      General: He is not in acute distress.    Appearance: He is well-developed. He is not diaphoretic.  Eyes:     General: No scleral icterus.    Conjunctiva/sclera: Conjunctivae normal.  Neck:     Thyroid: No thyromegaly.  Cardiovascular:     Rate and Rhythm: Normal rate and regular rhythm.     Heart sounds: Normal heart sounds. No murmur.  Pulmonary:     Effort: Pulmonary effort is normal. No respiratory distress.     Breath sounds: Normal breath sounds. No wheezing.  Abdominal:     General: Abdomen  is flat. Bowel sounds are normal. There is distension (Slight bowel distention).     Tenderness: There is no abdominal tenderness. There is no right CVA tenderness, left CVA tenderness, guarding or rebound.  Musculoskeletal:     Cervical back: Neck supple.     Comments: Using a walker and weaker now  Lymphadenopathy:     Cervical: No cervical adenopathy.  Skin:    General: Skin is warm and dry.  Findings: No rash.  Neurological:     Mental Status: He is alert and oriented to person, place, and time.     Coordination: Coordination normal.  Psychiatric:        Behavior: Behavior normal.     Oxygen testing, 84% on room air at rest, went up to 93% with 2 L nasal cannula oxygen.  Assessment & Plan:   Problem List Items Addressed This Visit      Cardiovascular and Mediastinum   Atrial fibrillation with slow ventricular response ([place]) - Primary   Relevant Orders   CoaguChek XS/INR Waived (Completed)   BMP8+EGFR   CBC with Differential/Platelet     Other   Acute anemia   Relevant Medications   ferrous sulfate 325 (65 FE) MG tablet   Other Relevant Orders   CBC with Differential/Platelet    Other Visit Diagnoses    Acute renal failure superimposed on stage 3b chronic kidney disease, unspecified acute renal failure type ([place])       Relevant Orders   BMP8+EGFR   CBC with Differential/Platelet    Will use MiraLAX for constipation and back off on iron to only once a day right now. Patient has 2 L nasal cannula at home and has a ventilator.  He has been having increasing difficulty especially at night now that he is living on his daughter's house and does not have his machine at the house.  This is been very much causing him issues and is not sleeping at night.  His ventilator machine did break. Follow up plan: Return in about 4 weeks (around 04/05/2019), or if symptoms worsen or fail to improve, for Recheck anemia.  Counseling provided for all of the vaccine components Orders  Placed This Encounter  Procedures  . CoaguChek XS/INR [place], MD [facility] 03/08/2019, 11:37 AM

## 2019-03-09 LAB — CBC WITH DIFFERENTIAL/PLATELET
Basophils Absolute: 0.1 10*3/uL (ref 0.0–0.2)
Basos: 2 %
EOS (ABSOLUTE): 0.1 10*3/uL (ref 0.0–0.4)
Eos: 2 %
Hematocrit: 34.3 % — ABNORMAL LOW (ref 37.5–51.0)
Hemoglobin: 10.2 g/dL — ABNORMAL LOW (ref 13.0–17.7)
Immature Grans (Abs): 0 10*3/uL (ref 0.0–0.1)
Immature Granulocytes: 1 %
Lymphocytes Absolute: 0.6 10*3/uL — ABNORMAL LOW (ref 0.7–3.1)
Lymphs: 12 %
MCH: 27.3 pg (ref 26.6–33.0)
MCHC: 29.7 g/dL — ABNORMAL LOW (ref 31.5–35.7)
MCV: 92 fL (ref 79–97)
Monocytes Absolute: 0.6 10*3/uL (ref 0.1–0.9)
Monocytes: 11 %
Neutrophils Absolute: 3.7 10*3/uL (ref 1.4–7.0)
Neutrophils: 72 %
Platelets: 180 10*3/uL (ref 150–450)
RBC: 3.74 x10E6/uL — ABNORMAL LOW (ref 4.14–5.80)
RDW: 20.2 % — ABNORMAL HIGH (ref 11.6–15.4)
WBC: 5.2 10*3/uL (ref 3.4–10.8)

## 2019-03-09 LAB — BMP8+EGFR
BUN/Creatinine Ratio: 32 — ABNORMAL HIGH (ref 10–24)
BUN: 61 mg/dL — ABNORMAL HIGH (ref 8–27)
CO2: 23 mmol/L (ref 20–29)
Calcium: 9 mg/dL (ref 8.6–10.2)
Chloride: 107 mmol/L — ABNORMAL HIGH (ref 96–106)
Creatinine, Ser: 1.93 mg/dL — ABNORMAL HIGH (ref 0.76–1.27)
GFR calc Af Amer: 40 mL/min/{1.73_m2} — ABNORMAL LOW (ref >59)
GFR calc non Af Amer: 35 mL/min/{1.73_m2} — ABNORMAL LOW (ref >59)
Glucose: 159 mg/dL — ABNORMAL HIGH (ref 65–99)
Potassium: 5.3 mmol/L — ABNORMAL HIGH (ref 3.5–5.2)
Sodium: 142 mmol/L (ref 134–144)

## 2019-03-11 ENCOUNTER — Other Ambulatory Visit: Payer: Self-pay | Admitting: *Deleted

## 2019-03-11 DIAGNOSIS — G4733 Obstructive sleep apnea (adult) (pediatric): Secondary | ICD-10-CM

## 2019-03-11 DIAGNOSIS — J439 Emphysema, unspecified: Secondary | ICD-10-CM

## 2019-03-28 ENCOUNTER — Emergency Department (HOSPITAL_COMMUNITY): Payer: Medicare Other

## 2019-03-28 ENCOUNTER — Encounter (HOSPITAL_COMMUNITY): Payer: Self-pay

## 2019-03-28 ENCOUNTER — Other Ambulatory Visit: Payer: Self-pay

## 2019-03-28 ENCOUNTER — Inpatient Hospital Stay (HOSPITAL_COMMUNITY)
Admission: EM | Admit: 2019-03-28 | Discharge: 2019-04-15 | DRG: 286 | Disposition: A | Discharge status: Home / Self Care | Payer: Medicare Other | Attending: Internal Medicine | Admitting: Internal Medicine

## 2019-03-28 DIAGNOSIS — Z885 Allergy status to narcotic agent status: Secondary | ICD-10-CM

## 2019-03-28 DIAGNOSIS — I5033 Acute on chronic diastolic (congestive) heart failure: Secondary | ICD-10-CM | POA: Diagnosis not present

## 2019-03-28 DIAGNOSIS — I428 Other cardiomyopathies: Secondary | ICD-10-CM

## 2019-03-28 DIAGNOSIS — R5381 Other malaise: Secondary | ICD-10-CM | POA: Diagnosis not present

## 2019-03-28 DIAGNOSIS — I5043 Acute on chronic combined systolic (congestive) and diastolic (congestive) heart failure: Secondary | ICD-10-CM | POA: Diagnosis present

## 2019-03-28 DIAGNOSIS — Z794 Long term (current) use of insulin: Secondary | ICD-10-CM

## 2019-03-28 DIAGNOSIS — D472 Monoclonal gammopathy: Secondary | ICD-10-CM | POA: Diagnosis present

## 2019-03-28 DIAGNOSIS — I10 Essential (primary) hypertension: Secondary | ICD-10-CM | POA: Diagnosis not present

## 2019-03-28 DIAGNOSIS — I130 Hypertensive heart and chronic kidney disease with heart failure and stage 1 through stage 4 chronic kidney disease, or unspecified chronic kidney disease: Secondary | ICD-10-CM | POA: Diagnosis not present

## 2019-03-28 DIAGNOSIS — R600 Localized edema: Secondary | ICD-10-CM | POA: Diagnosis not present

## 2019-03-28 DIAGNOSIS — D631 Anemia in chronic kidney disease: Secondary | ICD-10-CM | POA: Diagnosis present

## 2019-03-28 DIAGNOSIS — E1129 Type 2 diabetes mellitus with other diabetic kidney complication: Secondary | ICD-10-CM | POA: Diagnosis present

## 2019-03-28 DIAGNOSIS — Z8719 Personal history of other diseases of the digestive system: Secondary | ICD-10-CM

## 2019-03-28 DIAGNOSIS — E662 Morbid (severe) obesity with alveolar hypoventilation: Secondary | ICD-10-CM | POA: Diagnosis present

## 2019-03-28 DIAGNOSIS — Z7901 Long term (current) use of anticoagulants: Secondary | ICD-10-CM

## 2019-03-28 DIAGNOSIS — L89152 Pressure ulcer of sacral region, stage 2: Secondary | ICD-10-CM | POA: Diagnosis present

## 2019-03-28 DIAGNOSIS — I5082 Biventricular heart failure: Secondary | ICD-10-CM | POA: Diagnosis present

## 2019-03-28 DIAGNOSIS — Z20822 Contact with and (suspected) exposure to covid-19: Secondary | ICD-10-CM | POA: Diagnosis not present

## 2019-03-28 DIAGNOSIS — I2781 Cor pulmonale (chronic): Secondary | ICD-10-CM | POA: Diagnosis present

## 2019-03-28 DIAGNOSIS — Z85038 Personal history of other malignant neoplasm of large intestine: Secondary | ICD-10-CM

## 2019-03-28 DIAGNOSIS — I4891 Unspecified atrial fibrillation: Secondary | ICD-10-CM | POA: Diagnosis present

## 2019-03-28 DIAGNOSIS — N1831 Chronic kidney disease, stage 3a: Secondary | ICD-10-CM | POA: Diagnosis present

## 2019-03-28 DIAGNOSIS — Z8249 Family history of ischemic heart disease and other diseases of the circulatory system: Secondary | ICD-10-CM

## 2019-03-28 DIAGNOSIS — N179 Acute kidney failure, unspecified: Secondary | ICD-10-CM | POA: Diagnosis present

## 2019-03-28 DIAGNOSIS — I5021 Acute systolic (congestive) heart failure: Secondary | ICD-10-CM

## 2019-03-28 DIAGNOSIS — Z9981 Dependence on supplemental oxygen: Secondary | ICD-10-CM

## 2019-03-28 DIAGNOSIS — R0902 Hypoxemia: Secondary | ICD-10-CM | POA: Diagnosis not present

## 2019-03-28 DIAGNOSIS — J9621 Acute and chronic respiratory failure with hypoxia: Secondary | ICD-10-CM | POA: Diagnosis present

## 2019-03-28 DIAGNOSIS — E785 Hyperlipidemia, unspecified: Secondary | ICD-10-CM | POA: Diagnosis present

## 2019-03-28 DIAGNOSIS — I2609 Other pulmonary embolism with acute cor pulmonale: Secondary | ICD-10-CM

## 2019-03-28 DIAGNOSIS — R609 Edema, unspecified: Secondary | ICD-10-CM | POA: Diagnosis not present

## 2019-03-28 DIAGNOSIS — N189 Chronic kidney disease, unspecified: Secondary | ICD-10-CM

## 2019-03-28 DIAGNOSIS — Z9119 Patient's noncompliance with other medical treatment and regimen: Secondary | ICD-10-CM

## 2019-03-28 DIAGNOSIS — Z9049 Acquired absence of other specified parts of digestive tract: Secondary | ICD-10-CM

## 2019-03-28 DIAGNOSIS — Z87891 Personal history of nicotine dependence: Secondary | ICD-10-CM

## 2019-03-28 DIAGNOSIS — I119 Hypertensive heart disease without heart failure: Secondary | ICD-10-CM | POA: Diagnosis present

## 2019-03-28 DIAGNOSIS — R0602 Shortness of breath: Secondary | ICD-10-CM | POA: Diagnosis not present

## 2019-03-28 DIAGNOSIS — N183 Chronic kidney disease, stage 3 unspecified: Secondary | ICD-10-CM | POA: Diagnosis present

## 2019-03-28 DIAGNOSIS — I272 Pulmonary hypertension, unspecified: Secondary | ICD-10-CM | POA: Diagnosis present

## 2019-03-28 DIAGNOSIS — I2721 Secondary pulmonary arterial hypertension: Secondary | ICD-10-CM | POA: Diagnosis present

## 2019-03-28 DIAGNOSIS — Z6841 Body Mass Index (BMI) 40.0 and over, adult: Secondary | ICD-10-CM

## 2019-03-28 DIAGNOSIS — M109 Gout, unspecified: Secondary | ICD-10-CM | POA: Diagnosis present

## 2019-03-28 DIAGNOSIS — R531 Weakness: Secondary | ICD-10-CM | POA: Diagnosis not present

## 2019-03-28 DIAGNOSIS — E1165 Type 2 diabetes mellitus with hyperglycemia: Secondary | ICD-10-CM | POA: Diagnosis present

## 2019-03-28 DIAGNOSIS — I509 Heart failure, unspecified: Secondary | ICD-10-CM

## 2019-03-28 DIAGNOSIS — Z833 Family history of diabetes mellitus: Secondary | ICD-10-CM

## 2019-03-28 DIAGNOSIS — I5023 Acute on chronic systolic (congestive) heart failure: Secondary | ICD-10-CM | POA: Diagnosis not present

## 2019-03-28 DIAGNOSIS — I4821 Permanent atrial fibrillation: Secondary | ICD-10-CM | POA: Diagnosis not present

## 2019-03-28 DIAGNOSIS — Z79899 Other long term (current) drug therapy: Secondary | ICD-10-CM

## 2019-03-28 DIAGNOSIS — E1122 Type 2 diabetes mellitus with diabetic chronic kidney disease: Secondary | ICD-10-CM | POA: Diagnosis present

## 2019-03-28 DIAGNOSIS — I2510 Atherosclerotic heart disease of native coronary artery without angina pectoris: Secondary | ICD-10-CM | POA: Diagnosis present

## 2019-03-28 DIAGNOSIS — G4733 Obstructive sleep apnea (adult) (pediatric): Secondary | ICD-10-CM | POA: Diagnosis present

## 2019-03-28 LAB — CBC WITH DIFFERENTIAL/PLATELET
Abs Immature Granulocytes: 0.02 10*3/uL (ref 0.00–0.07)
Basophils Absolute: 0.1 10*3/uL (ref 0.0–0.1)
Basophils Relative: 2 %
Eosinophils Absolute: 0.2 10*3/uL (ref 0.0–0.5)
Eosinophils Relative: 4 %
HCT: 35.4 % — ABNORMAL LOW (ref 39.0–52.0)
Hemoglobin: 9.4 g/dL — ABNORMAL LOW (ref 13.0–17.0)
Immature Granulocytes: 0 %
Lymphocytes Relative: 12 %
Lymphs Abs: 0.6 10*3/uL — ABNORMAL LOW (ref 0.7–4.0)
MCH: 24.9 pg — ABNORMAL LOW (ref 26.0–34.0)
MCHC: 26.6 g/dL — ABNORMAL LOW (ref 30.0–36.0)
MCV: 93.7 fL (ref 80.0–100.0)
Monocytes Absolute: 0.6 10*3/uL (ref 0.1–1.0)
Monocytes Relative: 12 %
Neutro Abs: 3.4 10*3/uL (ref 1.7–7.7)
Neutrophils Relative %: 70 %
Platelets: 197 10*3/uL (ref 150–400)
RBC: 3.78 MIL/uL — ABNORMAL LOW (ref 4.22–5.81)
RDW: 20.2 % — ABNORMAL HIGH (ref 11.5–15.5)
WBC: 4.8 10*3/uL (ref 4.0–10.5)
nRBC: 0 % (ref 0.0–0.2)

## 2019-03-28 LAB — URINALYSIS, ROUTINE W REFLEX MICROSCOPIC
Bacteria, UA: NONE SEEN
Bilirubin Urine: NEGATIVE
Glucose, UA: NEGATIVE mg/dL
Ketones, ur: NEGATIVE mg/dL
Leukocytes,Ua: NEGATIVE
Nitrite: NEGATIVE
Protein, ur: 30 mg/dL — AB
Specific Gravity, Urine: 1.01 (ref 1.005–1.030)
pH: 5 (ref 5.0–8.0)

## 2019-03-28 LAB — COMPREHENSIVE METABOLIC PANEL
ALT: 13 U/L (ref 0–44)
AST: 16 U/L (ref 15–41)
Albumin: 3.2 g/dL — ABNORMAL LOW (ref 3.5–5.0)
Alkaline Phosphatase: 68 U/L (ref 38–126)
Anion gap: 11 (ref 5–15)
BUN: 50 mg/dL — ABNORMAL HIGH (ref 8–23)
CO2: 27 mmol/L (ref 22–32)
Calcium: 8.7 mg/dL — ABNORMAL LOW (ref 8.9–10.3)
Chloride: 103 mmol/L (ref 98–111)
Creatinine, Ser: 1.82 mg/dL — ABNORMAL HIGH (ref 0.61–1.24)
GFR calc Af Amer: 44 mL/min — ABNORMAL LOW (ref >60)
GFR calc non Af Amer: 38 mL/min — ABNORMAL LOW (ref >60)
Glucose, Bld: 137 mg/dL — ABNORMAL HIGH (ref 70–99)
Potassium: 3.9 mmol/L (ref 3.5–5.1)
Sodium: 141 mmol/L (ref 135–145)
Total Bilirubin: 0.8 mg/dL (ref 0.3–1.2)
Total Protein: 6.4 g/dL — ABNORMAL LOW (ref 6.5–8.1)

## 2019-03-28 LAB — HEMOGLOBIN A1C
Hgb A1c MFr Bld: 6.9 % — ABNORMAL HIGH (ref 4.8–5.6)
Mean Plasma Glucose: 151.33 mg/dL

## 2019-03-28 LAB — BRAIN NATRIURETIC PEPTIDE: B Natriuretic Peptide: 276 pg/mL — ABNORMAL HIGH (ref 0.0–100.0)

## 2019-03-28 LAB — SARS CORONAVIRUS 2 (TAT 6-24 HRS): SARS Coronavirus 2: NEGATIVE

## 2019-03-28 LAB — PROTIME-INR
INR: 2.9 — ABNORMAL HIGH (ref 0.8–1.2)
Prothrombin Time: 30.4 seconds — ABNORMAL HIGH (ref 11.4–15.2)

## 2019-03-28 LAB — GLUCOSE, CAPILLARY
Glucose-Capillary: 94 mg/dL (ref 70–99)
Glucose-Capillary: 131 mg/dL — ABNORMAL HIGH (ref 70–99)

## 2019-03-28 LAB — CBG MONITORING, ED: Glucose-Capillary: 130 mg/dL — ABNORMAL HIGH (ref 70–99)

## 2019-03-28 MED ORDER — FERROUS SULFATE 325 (65 FE) MG PO TABS
325.0000 mg | ORAL_TABLET | Freq: Every day | ORAL | Status: DC
  Administered 2019-03-29 – 2019-04-15 (×18): 325 mg via ORAL
  Filled 2019-03-28 (×18): qty 1

## 2019-03-28 MED ORDER — WARFARIN SODIUM 5 MG PO TABS
5.0000 mg | ORAL_TABLET | Freq: Every evening | ORAL | Status: DC
  Administered 2019-03-28 – 2019-04-01 (×5): 5 mg via ORAL
  Filled 2019-03-28 (×5): qty 1

## 2019-03-28 MED ORDER — ATORVASTATIN CALCIUM 10 MG PO TABS
20.0000 mg | ORAL_TABLET | Freq: Every evening | ORAL | Status: DC
  Administered 2019-03-28 – 2019-04-14 (×18): 20 mg via ORAL
  Filled 2019-03-28 (×3): qty 1
  Filled 2019-03-28: qty 2
  Filled 2019-03-28 (×2): qty 1
  Filled 2019-03-28: qty 2
  Filled 2019-03-28: qty 1
  Filled 2019-03-28: qty 2
  Filled 2019-03-28: qty 1
  Filled 2019-03-28 (×3): qty 2
  Filled 2019-03-28 (×2): qty 1
  Filled 2019-03-28: qty 2
  Filled 2019-03-28 (×2): qty 1

## 2019-03-28 MED ORDER — DILTIAZEM HCL ER 180 MG PO CP24: 180.0000 mg | ORAL_CAPSULE | Freq: Every day | ORAL | Status: DC

## 2019-03-28 MED ORDER — POLYVINYL ALCOHOL 1.4 % OP SOLN
1.0000 [drp] | OPHTHALMIC | Status: DC | PRN
  Filled 2019-03-28: qty 15

## 2019-03-28 MED ORDER — ACETAMINOPHEN 325 MG PO TABS
650.0000 mg | ORAL_TABLET | Freq: Four times a day (QID) | ORAL | Status: DC | PRN
  Administered 2019-04-06 – 2019-04-10 (×3): 650 mg via ORAL
  Filled 2019-03-28 (×4): qty 2

## 2019-03-28 MED ORDER — FUROSEMIDE 10 MG/ML IJ SOLN
60.0000 mg | Freq: Once | INTRAMUSCULAR | Status: AC
  Administered 2019-03-28: 60 mg via INTRAVENOUS
  Filled 2019-03-28: qty 6

## 2019-03-28 MED ORDER — FUROSEMIDE 10 MG/ML IJ SOLN
80.0000 mg | Freq: Two times a day (BID) | INTRAMUSCULAR | Status: DC
  Administered 2019-03-28 – 2019-03-30 (×4): 80 mg via INTRAVENOUS
  Filled 2019-03-28 (×4): qty 8

## 2019-03-28 MED ORDER — LOSARTAN POTASSIUM 50 MG PO TABS: 100.0000 mg | ORAL_TABLET | Freq: Every day | ORAL | Status: DC

## 2019-03-28 MED ORDER — ACETAMINOPHEN 650 MG RE SUPP: 650.0000 mg | Freq: Four times a day (QID) | RECTAL | Status: DC | PRN

## 2019-03-28 MED ORDER — SODIUM CHLORIDE 0.9 % IV SOLN
250.0000 mL | INTRAVENOUS | Status: DC | PRN
  Administered 2019-04-12 – 2019-04-13 (×2): 250 mL via INTRAVENOUS

## 2019-03-28 MED ORDER — METOPROLOL TARTRATE 50 MG PO TABS
50.0000 mg | ORAL_TABLET | Freq: Two times a day (BID) | ORAL | Status: DC
  Administered 2019-03-28 – 2019-04-09 (×22): 50 mg via ORAL
  Filled 2019-03-28 (×26): qty 1

## 2019-03-28 MED ORDER — POLYETHYLENE GLYCOL 3350 17 G PO PACK
17.0000 g | PACK | Freq: Every day | ORAL | Status: DC
  Administered 2019-03-30 – 2019-04-15 (×14): 17 g via ORAL
  Filled 2019-03-28 (×17): qty 1

## 2019-03-28 MED ORDER — METFORMIN HCL 500 MG PO TABS: 500.0000 mg | ORAL_TABLET | Freq: Two times a day (BID) | ORAL | Status: DC

## 2019-03-28 MED ORDER — ALLOPURINOL 300 MG PO TABS
300.0000 mg | ORAL_TABLET | Freq: Every day | ORAL | Status: DC
  Administered 2019-03-29 – 2019-04-15 (×18): 300 mg via ORAL
  Filled 2019-03-28 (×15): qty 1
  Filled 2019-03-28: qty 3
  Filled 2019-03-28 (×2): qty 1

## 2019-03-28 MED ORDER — INSULIN ASPART 100 UNIT/ML ~~LOC~~ SOLN: 0.0000 [IU] | Freq: Every day | SUBCUTANEOUS | Status: DC

## 2019-03-28 MED ORDER — POLYETHYLENE GLYCOL 3350 17 G PO PACK
17.0000 g | PACK | Freq: Every day | ORAL | Status: DC | PRN
  Administered 2019-03-31: 17 g via ORAL
  Filled 2019-03-28: qty 1

## 2019-03-28 MED ORDER — INSULIN DEGLUDEC 100 UNIT/ML ~~LOC~~ SOPN: 18.0000 [IU] | PEN_INJECTOR | Freq: Every day | SUBCUTANEOUS | Status: DC

## 2019-03-28 MED ORDER — DULAGLUTIDE 1.5 MG/0.5ML ~~LOC~~ SOAJ: 1.5000 mg | SUBCUTANEOUS | Status: DC

## 2019-03-28 MED ORDER — WARFARIN - PHYSICIAN DOSING INPATIENT: Freq: Every day | Status: DC

## 2019-03-28 MED ORDER — POTASSIUM CHLORIDE CRYS ER 20 MEQ PO TBCR
20.0000 meq | EXTENDED_RELEASE_TABLET | Freq: Every day | ORAL | Status: DC
  Administered 2019-03-28 – 2019-04-15 (×19): 20 meq via ORAL
  Filled 2019-03-28 (×10): qty 1
  Filled 2019-03-28: qty 2
  Filled 2019-03-28 (×8): qty 1

## 2019-03-28 MED ORDER — ACETAMINOPHEN 325 MG PO TABS
650.0000 mg | ORAL_TABLET | Freq: Four times a day (QID) | ORAL | Status: DC | PRN
  Administered 2019-04-01: 650 mg via ORAL

## 2019-03-28 MED ORDER — BENZONATATE 100 MG PO CAPS
100.0000 mg | ORAL_CAPSULE | Freq: Two times a day (BID) | ORAL | Status: DC
  Administered 2019-03-28 – 2019-04-15 (×35): 100 mg via ORAL
  Filled 2019-03-28 (×35): qty 1

## 2019-03-28 MED ORDER — SODIUM CHLORIDE 0.9% FLUSH: 3.0000 mL | INTRAVENOUS | Status: DC | PRN

## 2019-03-28 MED ORDER — TRAZODONE HCL 50 MG PO TABS
50.0000 mg | ORAL_TABLET | Freq: Every evening | ORAL | Status: DC | PRN
  Administered 2019-03-31 – 2019-04-08 (×6): 50 mg via ORAL
  Filled 2019-03-28 (×10): qty 1

## 2019-03-28 MED ORDER — ONDANSETRON HCL 4 MG PO TABS: 4.0000 mg | ORAL_TABLET | Freq: Four times a day (QID) | ORAL | Status: DC | PRN

## 2019-03-28 MED ORDER — INSULIN GLARGINE 100 UNIT/ML ~~LOC~~ SOLN
18.0000 [IU] | Freq: Every day | SUBCUTANEOUS | Status: DC
  Administered 2019-03-28 – 2019-04-14 (×18): 18 [IU] via SUBCUTANEOUS
  Filled 2019-03-28 (×22): qty 0.18

## 2019-03-28 MED ORDER — FUROSEMIDE 80 MG PO TABS: 80.0000 mg | ORAL_TABLET | Freq: Every day | ORAL | Status: DC

## 2019-03-28 MED ORDER — INSULIN ASPART 100 UNIT/ML ~~LOC~~ SOLN
0.0000 [IU] | Freq: Three times a day (TID) | SUBCUTANEOUS | Status: DC
  Administered 2019-03-28: 2 [IU] via SUBCUTANEOUS
  Administered 2019-03-29 (×2): 3 [IU] via SUBCUTANEOUS
  Administered 2019-03-30: 2 [IU] via SUBCUTANEOUS
  Administered 2019-03-30: 3 [IU] via SUBCUTANEOUS
  Administered 2019-03-30: 2 [IU] via SUBCUTANEOUS
  Administered 2019-03-31: 3 [IU] via SUBCUTANEOUS
  Administered 2019-04-01: 2 [IU] via SUBCUTANEOUS
  Administered 2019-04-01: 3 [IU] via SUBCUTANEOUS
  Administered 2019-04-02 – 2019-04-03 (×6): 2 [IU] via SUBCUTANEOUS
  Administered 2019-04-04: 3 [IU] via SUBCUTANEOUS
  Administered 2019-04-04 – 2019-04-05 (×3): 2 [IU] via SUBCUTANEOUS
  Administered 2019-04-06: 3 [IU] via SUBCUTANEOUS
  Administered 2019-04-06 – 2019-04-15 (×11): 2 [IU] via SUBCUTANEOUS
  Filled 2019-03-28: qty 1

## 2019-03-28 MED ORDER — PANTOPRAZOLE SODIUM 40 MG PO TBEC
40.0000 mg | DELAYED_RELEASE_TABLET | Freq: Every day | ORAL | Status: DC
  Administered 2019-03-29 – 2019-04-15 (×18): 40 mg via ORAL
  Filled 2019-03-28 (×18): qty 1

## 2019-03-28 MED ORDER — POTASSIUM CHLORIDE CRYS ER 20 MEQ PO TBCR
40.0000 meq | EXTENDED_RELEASE_TABLET | Freq: Once | ORAL | Status: AC
  Administered 2019-03-28: 40 meq via ORAL
  Filled 2019-03-28: qty 2

## 2019-03-28 MED ORDER — SODIUM CHLORIDE 0.9% FLUSH
3.0000 mL | Freq: Two times a day (BID) | INTRAVENOUS | Status: DC
  Administered 2019-03-29 – 2019-04-15 (×28): 3 mL via INTRAVENOUS

## 2019-03-28 MED ORDER — HEPARIN SODIUM (PORCINE) 5000 UNIT/ML IJ SOLN: 5000.0000 [IU] | Freq: Three times a day (TID) | INTRAMUSCULAR | Status: DC

## 2019-03-28 MED ORDER — ONDANSETRON HCL 4 MG/2ML IJ SOLN: 4.0000 mg | Freq: Four times a day (QID) | INTRAMUSCULAR | Status: DC | PRN

## 2019-03-28 MED ORDER — DILTIAZEM HCL ER COATED BEADS 180 MG PO CP24
180.0000 mg | ORAL_CAPSULE | Freq: Every day | ORAL | Status: DC
  Administered 2019-03-29 – 2019-04-09 (×12): 180 mg via ORAL
  Filled 2019-03-28 (×13): qty 1

## 2019-03-28 NOTE — Progress Notes (Signed)
10 Medications inventoried with pt and placed in secured, tamper proofed bag and taken to pharmacy to be locked in safe during hospital stay.

## 2019-03-28 NOTE — ED Triage Notes (Signed)
Abdominal pain and swelling for 2 days. Denies n/v/d

## 2019-03-28 NOTE — H&P (Addendum)
Patient Demographics:    William Flores, is a 69 y.o. male  MRN: [id number]   DOB - Jul 29, 1950  Admit Date - 03/28/2019  Outpatient Primary MD for the patient is [name], [name], MD   Assessment & Plan:    Principal Problem:   Acute on chronic heart failure with preserved ejection fraction (HFpEF) ([place]) Active Problems:   AKI (acute kidney injury) ([place])   Anticoagulated on Coumadin   Atrial fibrillation with slow ventricular response ([place])   DM (diabetes mellitus), type 2, uncontrolled, with renal complications ([place])   CKD stage 3 due to type 2 diabetes mellitus ([place])   Pulmonary hypertension ([place])   Hypertensive heart disease   Morbid obesity ([place])   OSA (obstructive sleep apnea)   Nonischemic cardiomyopathy ([place])   1)HFpEF/NICM--- acute on chronic preserved EF CHF exacerbation--- treat empirically with IV Lasix, daily weights, fluid input and output monitoring -Echo from December 2020 with EF of 50 to 55%  2)Acute on chronic respiratory failure with hypoxia secondary to [facility] exacerbation--- PTA patient was on 2 L of oxygen via nasal cannula currently requiring about 3 L/min --- Treat CHF exacerbation as above #1  3)AKI----acute kidney injury on CKD stage - IIIa-----   creatinine on admission= 1.82 , baseline creatinine = 1.3   , -- renally adjust medications, avoid nephrotoxic agents / dehydration  / hypotension -Hold losartan and Metformin -Monitor renal function closely with aggressive IV Lasix diuresis  4) pulmonary hypertension/morbid obesity/OSA-----CPAP nightly, IV diuresis as above #1  5)Chronic atrial fibrillation--- continue Coumadin for stroke prophylaxis, INR is 2.9,continuemetoprolol50 mg twice a day for rate contro and Cardizem for rate control   8)Diabetes--A1c 4.12 December 2018, --Stop Metformin due to kidney concerns -Hold Trulicity --- [facility] Tyler Aas and Use Novolog/Humalog Sliding scale insulin with Accu-Cheks/Fingersticks as ordered   With History of - Reviewed by me  Past Medical History:  Diagnosis Date  . CAD (coronary artery disease)    LHC 2/08:  mRCA 50%, o/w no sig CAD, EF 25%  . Cardiomyopathy ([place])    EF 35-40% in 2/16 in setting of AF with RVR >> echo 5/16 with improved LVF with EF 65-70%  . Chronic atrial fibrillation ([place])   . Chronic diastolic heart failure ([facility])    HFPF - EF ~65-70% (OFTEN EXACERBATED BY AFIB)  . CKD (chronic kidney disease)    hx of worsening renal failure and hyperK+ in setting of acute diast CHF >> required CVVHD in 4/16 and dialysis x 1 in 5/16  . CKD stage 3 due to type 2 diabetes mellitus ([place]) 09/17/2015  . Colon cancer ([place])   . Colon polyps 09/21/2012   Tubular adenoma  . Diabetes ([place])   . History of echocardiogram    Echo 05/16/14:  mild LVH, vigorous LVF, EF 65-70%, no RWMA, mean AV gradient 9 mmHg, MAC, mild MR, mod LAE, mild RVE, mild reduced RVF, severe RAE  . Hyperlipidemia   .  Hypertension   . Obesity hypoventilation syndrome ([place])   . Renal insufficiency   . Sleep apnea       Past Surgical History:  Procedure Laterality Date  . CIRCUMCISION    . COLON RESECTION    . COLONOSCOPY N/A 09/21/2012   Procedure: COLONOSCOPY;  Surgeon: Inda Castle, MD;  Location: [facility];  Service: Endoscopy;  Laterality: N/A;  . COLONOSCOPY N/A 12/28/2018   Procedure: COLONOSCOPY;  Surgeon: Rogene Houston, MD;  Location: [facility] SU[facility];  Service: Endoscopy;  Laterality: N/A;  . ESOPHAGOGASTRODUODENOSCOPY N/A 12/28/2018   Procedure: ESOPHAGOGASTRODUODENOSCOPY (EGD);  Surgeon: Rogene Houston, MD;  Location: [facility] ENDO SU[facility];  Service: Endoscopy;  Laterality: N/A;  . US ECHOCARDIOGRAPHY  03/04/2010   abnormal study    Chief Complaint  Patient presents with  . Abdominal Pain      HPI:    William Flores   is a 69 y.o. male with medical history significant forHTN, DM2, NICM/cardiomyopathy, CKD 3, atrial fibrillation on chronic anticoagulation, colon cancer,MGUS.On chronic 2 L O2 especially at night--- presenting with weight gain and worsening dyspnea at rest and dyspnea on exertion over the last couple of weeks despite increasing oral diuretics - Patient endorses orthopnea, dyspnea on exertion and dyspnea at rest as well as weight gain and significant edema --Patient denies chest pains or pleuritic symptoms --Chest x-ray in ED consistent with CHF, BNP is 276 -INR 2.9 -Hemoglobin of 9.4 with WBC of 4.8 and platelet of 197--baseline hemoglobin usually close to 10 -Creatinine is 1.82 which is close to his recent baseline  --Admit for inpatient IV diuresis after failing oral diuretics as outpatient   Review of systems:    In addition to the HPI above,   A full Review of  Systems was done, all other systems reviewed are negative except as noted above in HPI , .    Social History:  Reviewed by me    Social History   Tobacco Use  . Smoking status: Former Smoker    Quit date: 08/06/1983    Years since quitting: 35.6  . Smokeless tobacco: Never Used  Substance Use Topics  . Alcohol use: No     Family History :  Reviewed by me    Family History  Problem Relation Age of Onset  . Breast cancer Mother   . Diabetes Mother   . Heart attack Father      Home Medications:   Prior to Admission medications   Medication Sig Start Date End Date Taking? Authorizing Provider  acetaminophen (TYLENOL) 325 MG tablet Take 2 tablets (650 mg total) by mouth every 6 (six) hours as needed for mild pain (or Fever >/= 101). 01/01/19   Roxan Hockey, MD  allopurinol (ZYLOPRIM) 300 MG tablet TAKE 1 TABLET BY MOUTH EVERY DAY 01/28/19   [name], [name], MD  atorvastatin (LIPITOR) 20 MG tablet 1 tablet daily 01/28/19   [name], [name], MD  azelastine (OPTIVAR) 0.05 % ophthalmic solution PLACE 2  DROPS INTO BOTH EYES 2 (TWO) TIMES DAILY. Patient taking differently: Place 2 drops into the right eye 2 (two) times daily.  07/30/18   [name], [name], MD  benzonatate (TESSALON PERLES) 100 MG capsule Take 1 capsule (100 mg total) by mouth 2 (two) times daily. 03/08/19   [name], [name], MD  DILT-XR 180 MG 24 hr capsule TAKE 1 CAPSULE BY MOUTH EVERY DAY 01/28/19   [name], [name], MD  ferrous sulfate 325 (65 FE) MG tablet Take  325 mg by mouth in the morning and at bedtime.    [provider]  furosemide (LASIX) 80 MG tablet Take 1 tablet (80 mg total) by mouth daily. 01/28/19   [name], [name], MD  insulin aspart (NOVOLOG FLEXPEN) 100 UNIT/ML FlexPen insulin aspart (novoLOG) injection 0-9 Units 0-9 Units, Subcutaneous, 3 times daily with meals, CBG < 70: Implement Hypoglycemia Standing Orders and refer to Hypoglycemia Standing Orders sidebar report CBG 70 - 120: 0 units CBG 121 - 150: 1 unit CBG 151 - 200: 2 units CBG 201 - 250: 3 units CBG 251 - 300: 5 units CBG 301 - 350: 7 units CBG 351 - 400: 9 units CBG > 400 02/06/19   [name], [name], MD  insulin degludec (TRESIBA FLEXTOUCH) 100 UNIT/ML SOPN FlexTouch Pen Inject 0.18 mLs (18 Units total) into the skin at bedtime. 01/28/19   [name], [name], MD  Insulin Pen Needle 32G X 4 MM MISC Use to give insulin daily Dx E11.29 01/28/19   [name], [name], MD  losartan (COZAAR) 100 MG tablet Take 1 tablet (100 mg total) by mouth daily. 01/28/19   [name], [name], MD  metFORMIN (GLUCOPHAGE) 500 MG tablet Take 1 tablet (500 mg total) by mouth 2 (two) times daily with a meal. 02/06/19   [name], [name], MD  metoprolol tartrate (LOPRESSOR) 50 MG tablet Take 1 tablet (50 mg total) by mouth 2 (two) times daily. 01/28/19   [name], [name], MD  pantoprazole (PROTONIX) 40 MG tablet Take 1 tablet (40 mg total) by mouth daily. 01/28/19   [name], [name], MD  polyethylene glycol (MIRALAX / GLYCOLAX) 17 g packet Take 17 g by mouth  daily. 01/01/19   Roxan Hockey, MD  polyvinyl alcohol (LIQUIFILM TEARS) 1.4 % ophthalmic solution Place 1 drop into both eyes as needed for dry eyes. 01/28/19   [name], [name], MD  potassium chloride SA (KLOR-CON M20) 20 MEQ tablet TAKE 1/2 TABLET (10 MEQ TOTAL) BY MOUTH DAILY. NEED OV. *INS WILL COVER 4/19 OR LATER* 01/28/19   [name], [name], MD  TRULICITY 1.5 JA/2.5KN SOPN INJECT 1.5 MG INTO THE SKIN ONCE A WEEK. 01/28/19   [name], [name], MD  warfarin (COUMADIN) 5 MG tablet Take 1 tablet (5 mg total) by mouth every evening. 01/28/19   [name], [name], MD  diltiazem [facility]) 240 MG 24 hr capsule TAKE 1 CAPSULE DAILY 10/19/12 02/17/13  Vernie Shanks, MD     Allergies:     Allergies  Allergen Reactions  . Codeine     Headache      Physical Exam:   Vitals  Blood pressure (!) 145/72, pulse 85, temperature 98.7 F (37.1 C), temperature source Oral, resp. rate (!) 24, height _0  (1.626 m), weight 129 kg, SpO2 99 %.  Physical Examination: General appearance - alert,dyspnea on exertion, morbidly obese Mental status - alert, oriented to person, place, and time,  Eyes - sclera anicteric Neck - supple,, Chest -diminished in bases with faint bibasilar rales  heart - S1 and S2 normal, regular  Abdomen - soft, nontender, nondistended, no masses or organomegaly, significant anterior lower abdominal wall pitting edema Neurological - screening mental status exam normal, neck supple without rigidity, cranial nerves II through XII intact, DTR's normal and symmetric Extremities -2-3+ pitting pedal edema noted, intact peripheral pulses Skin - warm, dry     Data Review:    CBC Recent Labs  Lab 03/28/19 0912  WBC 4.8  HGB 9.4*  HCT 35.4*  PLT 197  MCV 93.7  MCH 24.9*  MCHC 26.6*  RDW 20.2*  LYMPHSABS 0.6*  MONOABS 0.6  EOSABS 0.2  BASOSABS 0.1    ------------------------------------------------------------------------------------------------------------------  Chemistries  Recent Labs  Lab 03/28/19 0912  NA 141  K 3.9  CL 103  CO2 27  GLUCOSE 137*  BUN 50*  CREATININE 1.82*  CALCIUM 8.7*  AST 16  ALT 13  ALKPHOS 68  BILITOT 0.8   ------------------------------------------------------------------------------------------------------------------ estimated creatinine clearance is 48.5 mL/min (A) (by C-G formula based on SCr of 1.82 mg/dL (H)). ------------------------------------------------------------------------------------------------------------------ No results for input(s): TSH, T4TOTAL, T3FREE, THYROIDAB in the last 72 hours.  Invalid input(s): FREET3   Coagulation profile Recent Labs  Lab 03/28/19 0912  INR 2.9*   ------------------------------------------------------------------------------------------------------------------- No results for input(s): DDIMER in the last 72 hours. -------------------------------------------------------------------------------------------------------------------  Cardiac Enzymes No results for input(s): CKMB, TROPONINI, MYOGLOBIN in the last 168 hours.  Invalid input(s): CK ------------------------------------------------------------------------------------------------------------------    Component Value Date/Time   BNP 276.0 (H) 03/28/2019 0912   Urinalysis    Component Value Date/Time   COLORURINE YELLOW 03/28/2019 1020   APPEARANCEUR CLEAR 03/28/2019 1020   LABSPEC 1.010 03/28/2019 1020   PHURINE 5.0 03/28/2019 1020   GLUCOSEU NEGATIVE 03/28/2019 1020   HGBUR SMALL (A) 03/28/2019 1020   BILIRUBINUR NEGATIVE 03/28/2019 1020   KETONESUR NEGATIVE 03/28/2019 1020   PROTEINUR 30 (A) 03/28/2019 1020   UROBILINOGEN 0.2 05/16/2014 0822   NITRITE NEGATIVE 03/28/2019 1020   LEUKOCYTESUR NEGATIVE 03/28/2019 1020     Imaging Results:    DG Chest 2  View  Result Date: 03/28/2019 CLINICAL DATA:  Shortness of breath EXAM: CHEST - 2 VIEW COMPARISON:  12/25/2018 FINDINGS: Cardiac shadow is enlarged but stable. Aortic calcifications are again seen. Central vascular congestion is noted with mild interstitial edema. No focal infiltrate is seen. Chronic pleural thickening on the left is noted. No acute bony abnormality is seen. IMPRESSION: Changes consistent with mild CHF. Chronic pleural thickening on the left is noted. Electronically Signed   By: Inez Catalina M.D.   On: 03/28/2019 09:46    Radiological Exams on Admission: DG Chest 2 View  Result Date: 03/28/2019 CLINICAL DATA:  Shortness of breath EXAM: CHEST - 2 VIEW COMPARISON:  12/25/2018 FINDINGS: Cardiac shadow is enlarged but stable. Aortic calcifications are again seen. Central vascular congestion is noted with mild interstitial edema. No focal infiltrate is seen. Chronic pleural thickening on the left is noted. No acute bony abnormality is seen. IMPRESSION: Changes consistent with mild CHF. Chronic pleural thickening on the left is noted. Electronically Signed   By: Inez Catalina M.D.   On: 03/28/2019 09:46   DVT Prophylaxis -SCD   AM Labs Ordered, also please review Full Orders  Family Communication: Admission, patients condition and plan of care including tests being ordered have been discussed with the patient who indicate understanding and agree with the plan   Code Status - Full Code  Likely DC to  home  Condition   stable  Roxan Hockey M.D on 03/28/2019 at 7:25 PM Go to [email or web address] -  for contact info  [facility] - Office  [phone]

## 2019-03-28 NOTE — ED Provider Notes (Signed)
[facility] EMERGENCY DEPARTMENT Provider Note   CSN: [id number] Arrival date & time: 03/28/19  0827     History Chief Complaint  Patient presents with  . Abdominal Pain    William Flores is a 69 y.o. male.  Patient with history of cardiomyopathy, atrial fibrillation chronic on Coumadin, kidney disease, recent home oxygen 2 L, high blood pressure presents with worsening leg edema, abdominal swelling and mild shortness of breath for the past 2 and half days.  Patient feels this overall similar to his heart failure history.  Patient was instructed to increase his Lasix dose when he had the symptoms restarted taking it 80 mg last night however no significant improvement.  Patient's had weight gain unknown amount.  Patient denies fevers or significant cough.  No chest pain.        Past Medical History:  Diagnosis Date  . CAD (coronary artery disease)    LHC 2/08:  mRCA 50%, o/w no sig CAD, EF 25%  . Cardiomyopathy ([place])    EF 35-40% in 2/16 in setting of AF with RVR >> echo 5/16 with improved LVF with EF 65-70%  . Chronic atrial fibrillation ([place])   . Chronic diastolic heart failure (HCC)    HFPF - EF ~65-70% (OFTEN EXACERBATED BY AFIB)  . CKD (chronic kidney disease)    hx of worsening renal failure and hyperK+ in setting of acute diast CHF >> required CVVHD in 4/16 and dialysis x 1 in 5/16  . CKD stage 3 due to type 2 diabetes mellitus ([place]) 09/17/2015  . Colon cancer ([place])   . Colon polyps 09/21/2012   Tubular adenoma  . Diabetes ([place])   . History of echocardiogram    Echo 05/16/14:  mild LVH, vigorous LVF, EF 65-70%, no RWMA, mean AV gradient 9 mmHg, MAC, mild MR, mod LAE, mild RVE, mild reduced RVF, severe RAE  . Hyperlipidemia   . Hypertension   . Obesity hypoventilation syndrome ([place])   . Renal insufficiency   . Sleep apnea     Patient Active Problem List   Diagnosis Date Noted  . Acute on chronic systolic CHF (congestive heart failure) ([place]) 03/28/2019  . Acute on  chronic heart failure with preserved ejection fraction (HFpEF) ([place]) 03/28/2019  . Pulmonary hypertension ([place]) 12/28/2018  . Acute anemia 12/25/2018  . Hyperlipidemia associated with type 2 diabetes mellitus ([place]) 05/18/2017  . Morbid obesity with BMI of 45.0-49.9, adult ([place]) 05/18/2017  . CKD stage 3 due to type 2 diabetes mellitus ([place]) 09/17/2015  . MGUS (monoclonal gammopathy of unknown significance) 05/27/2015  . Normocytic anemia 11/04/2014  . Peripheral edema   . Essential hypertension   . DM (diabetes mellitus), type 2, uncontrolled, with renal complications ([place]) 07/62/2633  . Dysphagia 04/20/2014  . Atrial fibrillation with slow ventricular response ([place]) 04/14/2014  . AKI (acute kidney injury) ([place]) 04/14/2014  . Anticoagulated on Coumadin 03/20/2014  . Nonischemic cardiomyopathy ([place]) 02/20/2014  . OSA (obstructive sleep apnea)   . Right heart failure ([place]) 02/10/2014  . Morbid obesity ([place]) 08/05/2013  . History of colon cancer   . Gout 05/25/2012  . Hypertensive heart disease     Past Surgical History:  Procedure Laterality Date  . CIRCUMCISION    . COLON RESECTION    . COLONOSCOPY N/A 09/21/2012   Procedure: COLONOSCOPY;  Surgeon: Inda Castle, MD;  Location: [facility] ENDOSCOPY;  Service: Endoscopy;  Laterality: N/A;  . COLONOSCOPY N/A 12/28/2018   Procedure: COLONOSCOPY;  Surgeon: Hildred Laser  U, MD;  Location: [facility] ENDO SUITE;  Service: Endoscopy;  Laterality: N/A;  . ESOPHAGOGASTRODUODENOSCOPY N/A 12/28/2018   Procedure: ESOPHAGOGASTRODUODENOSCOPY (EGD);  Surgeon: Rogene Houston, MD;  Location: [facility] ENDO SUITE;  Service: Endoscopy;  Laterality: N/A;  . US ECHOCARDIOGRAPHY  03/04/2010   abnormal study       Family History  Problem Relation Age of Onset  . Breast cancer Mother   . Diabetes Mother   . Heart attack Father     Social History   Tobacco Use  . Smoking status: Former Smoker    Quit date: 08/06/1983    Years since quitting: 35.6  . Smokeless  tobacco: Never Used  Substance Use Topics  . Alcohol use: No  . Drug use: No    Home Medications Prior to Admission medications   Medication Sig Start Date End Date Taking? Authorizing Provider  acetaminophen (TYLENOL) 325 MG tablet Take 2 tablets (650 mg total) by mouth every 6 (six) hours as needed for mild pain (or Fever >/= 101). 01/01/19   Roxan Hockey, MD  allopurinol (ZYLOPRIM) 300 MG tablet TAKE 1 TABLET BY MOUTH EVERY DAY 01/28/19   [name], [name], MD  atorvastatin (LIPITOR) 20 MG tablet 1 tablet daily 01/28/19   [name], [name], MD  azelastine (OPTIVAR) 0.05 % ophthalmic solution PLACE 2 DROPS INTO BOTH EYES 2 (TWO) TIMES DAILY. Patient taking differently: Place 2 drops into the right eye 2 (two) times daily.  07/30/18   [name], [name], MD  benzonatate (TESSALON PERLES) 100 MG capsule Take 1 capsule (100 mg total) by mouth 2 (two) times daily. 03/08/19   [name], [name], MD  DILT-XR 180 MG 24 hr capsule TAKE 1 CAPSULE BY MOUTH EVERY DAY 01/28/19   [name], [name], MD  ferrous sulfate 325 (65 FE) MG tablet Take 325 mg by mouth in the morning and at bedtime.    [provider]  furosemide (LASIX) 80 MG tablet Take 1 tablet (80 mg total) by mouth daily. 01/28/19   [name], [name], MD  insulin aspart (NOVOLOG FLEXPEN) 100 UNIT/ML FlexPen insulin aspart (novoLOG) injection 0-9 Units 0-9 Units, Subcutaneous, 3 times daily with meals, CBG < 70: Implement Hypoglycemia Standing Orders and refer to Hypoglycemia Standing Orders sidebar report CBG 70 - 120: 0 units CBG 121 - 150: 1 unit CBG 151 - 200: 2 units CBG 201 - 250: 3 units CBG 251 - 300: 5 units CBG 301 - 350: 7 units CBG 351 - 400: 9 units CBG > 400 02/06/19   [name], [name], MD  insulin degludec (TRESIBA FLEXTOUCH) 100 UNIT/ML SOPN FlexTouch Pen Inject 0.18 mLs (18 Units total) into the skin at bedtime. 01/28/19   [name], [name], MD  Insulin Pen Needle 32G X 4 MM MISC Use to give insulin daily Dx  E11.29 01/28/19   [name], [name], MD  losartan (COZAAR) 100 MG tablet Take 1 tablet (100 mg total) by mouth daily. 01/28/19   [name], [name], MD  metFORMIN (GLUCOPHAGE) 500 MG tablet Take 1 tablet (500 mg total) by mouth 2 (two) times daily with a meal. 02/06/19   [name], [name], MD  metoprolol tartrate (LOPRESSOR) 50 MG tablet Take 1 tablet (50 mg total) by mouth 2 (two) times daily. 01/28/19   [name], [name], MD  pantoprazole (PROTONIX) 40 MG tablet Take 1 tablet (40 mg total) by mouth daily. 01/28/19   [name], [name], MD  polyethylene glycol (MIRALAX / GLYCOLAX) 17 g packet  Take 17 g by mouth daily. 01/01/19   Roxan Hockey, MD  polyvinyl alcohol (LIQUIFILM TEARS) 1.4 % ophthalmic solution Place 1 drop into both eyes as needed for dry eyes. 01/28/19   [name], [name], MD  potassium chloride SA (KLOR-CON M20) 20 MEQ tablet TAKE 1/2 TABLET (10 MEQ TOTAL) BY MOUTH DAILY. NEED OV. *INS WILL COVER 4/19 OR LATER* 01/28/19   [name], [name], MD  TRULICITY 1.5 UU/7.2ZD SOPN INJECT 1.5 MG INTO THE SKIN ONCE A WEEK. 01/28/19   [name], [name], MD  warfarin (COUMADIN) 5 MG tablet Take 1 tablet (5 mg total) by mouth every evening. 01/28/19   [name], [name], MD  diltiazem [facility]) 240 MG 24 hr capsule TAKE 1 CAPSULE DAILY 10/19/12 02/17/13  Vernie Shanks, MD    Allergies    Codeine  Review of Systems   Review of Systems  Constitutional: Negative for chills and fever.  HENT: Negative for congestion.   Eyes: Negative for visual disturbance.  Respiratory: Positive for shortness of breath.   Cardiovascular: Positive for leg swelling. Negative for chest pain.  Gastrointestinal: Negative for abdominal pain (discomfort from swelling) and vomiting.  Genitourinary: Negative for dysuria and flank pain.  Musculoskeletal: Negative for back pain, neck pain and neck stiffness.  Skin: Negative for rash.  Neurological: Negative for light-headedness and headaches.    Physical  Exam Updated Vital Signs BP 113/63 (BP Location: Left Arm)   Pulse 61   Temp 98.7 F (37.1 C) (Oral)   Resp 19   Ht _0  (1.626 m)   Wt 126.6 kg   SpO2 96%   BMI 47.89 kg/m   Physical Exam Vitals and nursing note reviewed.  Constitutional:      Appearance: He is well-developed.  HENT:     Head: Normocephalic and atraumatic.  Eyes:     General:        Right eye: No discharge.        Left eye: No discharge.     Conjunctiva/sclera: Conjunctivae normal.  Neck:     Trachea: No tracheal deviation.  Cardiovascular:     Rate and Rhythm: Normal rate and regular rhythm.  Pulmonary:     Effort: Pulmonary effort is normal.     Breath sounds: Normal breath sounds.  Abdominal:     General: There is no distension.     Palpations: Abdomen is soft.     Tenderness: There is no abdominal tenderness. There is no guarding.  Musculoskeletal:     Cervical back: Normal range of motion and neck supple.  Skin:    General: Skin is warm.     Findings: No rash.     Comments: Patient has moderate edema bilateral lower extremities and abdominal and suprapubic region.  No external sign of infection.  No significant pain to palpation.  No sign of cellulitis.  Neurological:     General: No focal deficit present.     Mental Status: He is alert and oriented to person, place, and time.  Psychiatric:        Mood and Affect: Mood normal.     ED Results / Procedures / Treatments   Labs (all labs ordered are listed, but only abnormal results are displayed) Labs Reviewed  COMPREHENSIVE METABOLIC PANEL - Abnormal; Notable for the following components:      Result Value   Glucose, Bld 137 (*)    BUN 50 (*)    Creatinine, Ser 1.82 (*)    Calcium 8.7 (*)  Total Protein 6.4 (*)    Albumin 3.2 (*)    GFR calc non Af Amer 38 (*)    GFR calc Af Amer 44 (*)    All other components within normal limits  CBC WITH DIFFERENTIAL/PLATELET - Abnormal; Notable for the following components:   RBC 3.78 (*)      Hemoglobin 9.4 (*)    HCT 35.4 (*)    MCH 24.9 (*)    MCHC 26.6 (*)    RDW 20.2 (*)    Lymphs Abs 0.6 (*)    All other components within normal limits  PROTIME-INR - Abnormal; Notable for the following components:   Prothrombin Time 30.4 (*)    INR 2.9 (*)    All other components within normal limits  URINALYSIS, ROUTINE W REFLEX MICROSCOPIC - Abnormal; Notable for the following components:   Hgb urine dipstick SMALL (*)    Protein, ur 30 (*)    All other components within normal limits  BRAIN NATRIURETIC PEPTIDE - Abnormal; Notable for the following components:   B Natriuretic Peptide 276.0 (*)    All other components within normal limits  HEMOGLOBIN A1C - Abnormal; Notable for the following components:   Hgb A1c MFr Bld 6.9 (*)    All other components within normal limits  BASIC METABOLIC PANEL - Abnormal; Notable for the following components:   Glucose, Bld 140 (*)    BUN 45 (*)    Creatinine, Ser 1.67 (*)    GFR calc non Af Amer 42 (*)    GFR calc Af Amer 48 (*)    All other components within normal limits  CBC - Abnormal; Notable for the following components:   RBC 3.69 (*)    Hemoglobin 9.3 (*)    HCT 33.7 (*)    MCH 25.2 (*)    MCHC 27.6 (*)    RDW 20.0 (*)    All other components within normal limits  PROTIME-INR - Abnormal; Notable for the following components:   Prothrombin Time 28.2 (*)    INR 2.7 (*)    All other components within normal limits  GLUCOSE, CAPILLARY - Abnormal; Notable for the following components:   Glucose-Capillary 131 (*)    All other components within normal limits  GLUCOSE, CAPILLARY - Abnormal; Notable for the following components:   Glucose-Capillary 118 (*)    All other components within normal limits  GLUCOSE, CAPILLARY - Abnormal; Notable for the following components:   Glucose-Capillary 157 (*)    All other components within normal limits  CBG MONITORING, ED - Abnormal; Notable for the following components:    Glucose-Capillary 130 (*)    All other components within normal limits  SARS CORONAVIRUS 2 (TAT 6-24 HRS)  GLUCOSE, CAPILLARY    EKG None  Radiology DG Chest 2 View  Result Date: 03/28/2019 CLINICAL DATA:  Shortness of breath EXAM: CHEST - 2 VIEW COMPARISON:  12/25/2018 FINDINGS: Cardiac shadow is enlarged but stable. Aortic calcifications are again seen. Central vascular congestion is noted with mild interstitial edema. No focal infiltrate is seen. Chronic pleural thickening on the left is noted. No acute bony abnormality is seen. IMPRESSION: Changes consistent with mild CHF. Chronic pleural thickening on the left is noted. Electronically Signed   By: Inez Catalina M.D.   On: 03/28/2019 09:46    Procedures Procedures (including critical care time)  Medications Ordered in ED Medications  furosemide (LASIX) injection 80 mg (80 mg Intravenous Given 03/29/19 0453)  insulin aspart (novoLOG) injection  0-15 Units (3 Units Subcutaneous Given 03/29/19 1136)  insulin aspart (novoLOG) injection 0-5 Units (0 Units Subcutaneous Not Given 03/28/19 2109)  polyethylene glycol (MIRALAX / GLYCOLAX) packet 17 g (17 g Oral Not Given 03/29/19 0803)  acetaminophen (TYLENOL) tablet 650 mg (has no administration in time range)  allopurinol (ZYLOPRIM) tablet 300 mg (300 mg Oral Given 03/29/19 0806)  warfarin (COUMADIN) tablet 5 mg (5 mg Oral Given 03/28/19 1845)  polyvinyl alcohol (LIQUIFILM TEARS) 1.4 % ophthalmic solution 1 drop (has no administration in time range)  atorvastatin (LIPITOR) tablet 20 mg (20 mg Oral Given 03/28/19 1844)  metoprolol tartrate (LOPRESSOR) tablet 50 mg (50 mg Oral Given 03/29/19 0805)  pantoprazole (PROTONIX) EC tablet 40 mg (40 mg Oral Given 03/29/19 0804)  potassium chloride SA (KLOR-CON) CR tablet 20 mEq (20 mEq Oral Given 03/29/19 0804)  ferrous sulfate tablet 325 mg (325 mg Oral Given 03/29/19 0805)  benzonatate (TESSALON) capsule 100 mg (100 mg Oral Given 03/29/19 0807)  sodium  chloride flush (NS) 0.9 % injection 3 mL (3 mLs Intravenous Given 03/29/19 0808)  sodium chloride flush (NS) 0.9 % injection 3 mL (has no administration in time range)  0.9 %  sodium chloride infusion (has no administration in time range)  acetaminophen (TYLENOL) tablet 650 mg (has no administration in time range)    Or  acetaminophen (TYLENOL) suppository 650 mg (has no administration in time range)  traZODone (DESYREL) tablet 50 mg (has no administration in time range)  polyethylene glycol (MIRALAX / GLYCOLAX) packet 17 g (has no administration in time range)  ondansetron (ZOFRAN) tablet 4 mg (has no administration in time range)    Or  ondansetron (ZOFRAN) injection 4 mg (has no administration in time range)  diltiazem (CARDIZEM CD) 24 hr capsule 180 mg (180 mg Oral Given 03/29/19 0804)  insulin glargine (LANTUS) injection 18 Units (18 Units Subcutaneous Given 03/28/19 2142)  Warfarin - Physician Dosing Inpatient (has no administration in time range)  guaiFENesin-dextromethorphan (ROBITUSSIN DM) 100-10 MG/5ML syrup 5 mL (5 mLs Oral Given 03/29/19 0208)  furosemide (LASIX) injection 60 mg (60 mg Intravenous Given 03/28/19 1012)  potassium chloride SA (KLOR-CON) CR tablet 40 mEq (40 mEq Oral Given 03/28/19 1012)    ED Course  I have reviewed the triage vital signs and the nursing notes.  Pertinent labs & imaging results that were available during my care of the patient were reviewed by me and considered in my medical decision making (see chart for details).    MDM Rules/Calculators/A&P                      Patient with significant heart failure history on Lasix presents with worsening edema abdominal and legs.  Concern clinically for edema secondary to heart failure history.  Plan for blood work to check liver function, kidney function, electrolytes and likely plan for IV Lasix. EKG reviewed no acute findings.  Blood work reviewed hemoglobin 9.4, normal white blood cell count. IV Lasix  ordered.  Patient admitted.  Final Clinical Impression(s) / ED Diagnoses Final diagnoses:  Acute systolic congestive heart failure (HCC)  Bilateral leg edema  Chronic renal failure, unspecified CKD stage    Rx / DC Orders ED Discharge Orders    None       Elnora Morrison, MD 03/29/19 1620

## 2019-03-28 NOTE — ED Notes (Signed)
ED TO INPATIENT HANDOFF REPORT  ED Nurse Name and Phone #: [phone]  S Name/Age/Gender William Flores 69 y.o. male Room/Bed: APA10/APA10  Code Status   Code Status: Prior  Home/SNF/Other Home Patient oriented to: self, place, time and situation Is this baseline? Yes   Triage Complete: Triage complete  Chief Complaint Acute on chronic systolic CHF (congestive heart failure) ([place]) [I50.23]  Triage Note Abdominal pain and swelling for 2 days. Denies n/v/d    Allergies Allergies  Allergen Reactions  . Codeine     Headache     Level of Care/Admitting Diagnosis ED Disposition    ED Disposition Condition Comment   Admit  Hospital Area: [facility] [[id number]]  Level of Care: Telemetry [5]  Covid Evaluation: Asymptomatic Screening Protocol (No Symptoms)  Diagnosis: Acute on chronic systolic CHF (congestive heart failure) [facility]) [[id number]]  Admitting Physician: Morrison Old  Attending Physician: Morrison Old  Bed request comments: tele       B Medical/Surgery History Past Medical History:  Diagnosis Date  . CAD (coronary artery disease)    LHC 2/08:  mRCA 50%, o/w no sig CAD, EF 25%  . Cardiomyopathy ([place])    EF 35-40% in 2/16 in setting of AF with RVR >> echo 5/16 with improved LVF with EF 65-70%  . Chronic atrial fibrillation ([employer])   . Chronic diastolic heart failure (HCC)    HFPF - EF ~65-70% (OFTEN EXACERBATED BY AFIB)  . CKD (chronic kidney disease)    hx of worsening renal failure and hyperK+ in setting of acute diast CHF >> required CVVHD in 4/16 and dialysis x 1 in 5/16  . CKD stage 3 due to type 2 diabetes mellitus ([place]) 09/17/2015  . Colon cancer ([place])   . Colon polyps 09/21/2012   Tubular adenoma  . Diabetes ([place])   . History of echocardiogram    Echo 05/16/14:  mild LVH, vigorous LVF, EF 65-70%, no RWMA, mean AV gradient 9 mmHg, MAC, mild MR, mod LAE, mild RVE, mild reduced RVF, severe RAE  . Hyperlipidemia   .  Hypertension   . Obesity hypoventilation syndrome ([place])   . Renal insufficiency   . Sleep apnea    Past Surgical History:  Procedure Laterality Date  . CIRCUMCISION    . COLON RESECTION    . COLONOSCOPY N/A 09/21/2012   Procedure: COLONOSCOPY;  Surgeon: Inda Castle, MD;  Location: [facility] ENDOS[facility];  Service: Endoscopy;  Laterality: N/A;  . COLONOSCOPY N/A 12/28/2018   Procedure: COLONOSCOPY;  Surgeon: Rogene Houston, MD;  Location: [facility] ENDO SU[facility];  Service: Endoscopy;  Laterality: N/A;  . ESOPHAGOGASTRODUODENOSCOPY N/A 12/28/2018   Procedure: ESOPHAGOGASTRODUODENOSCOPY (EGD);  Surgeon: Rogene Houston, MD;  Location: [facility] END[facility];  Service: Endoscopy;  Laterality: N/A;  . US ECHOCARDIOGRAPHY  03/04/2010   abnormal study     A IV Location/Drains/Wounds Patient Lines/Drains/Airways Status   Active Line/Drains/Airways    Name:   Placement date:   Placement time:   Site:   Days:   Peripheral IV 03/28/19 Right Antecubital   03/28/19    1126    Antecubital   less than 1   Pressure Ulcer 02/17/14 Stage II -  Partial thickness loss of dermis presenting as a shallow open ulcer with a red, pink wound bed without slough.   02/17/14    2030     1865          Intake/Output Last 24 hours No intake or output data in  the 24 hours ending 03/28/19 1412  Labs/Imaging Results for orders placed or performed during the hospital encounter of 03/28/19 (from the past 48 hour(s))  Comprehensive metabolic panel     Status: Abnormal   Collection Time: 03/28/19  9:12 AM  Result Value Ref Range   Sodium 141 135 - 145 mmol/L   Potassium 3.9 3.5 - 5.1 mmol/L   Chloride 103 98 - 111 mmol/L   CO2 27 22 - 32 mmol/L   Glucose, Bld 137 (H) 70 - 99 mg/dL    Comment: Glucose reference range applies only to samples taken after fasting for at least 8 hours.   BUN 50 (H) 8 - 23 mg/dL   Creatinine, Ser 1.82 (H) 0.61 - 1.24 mg/dL   Calcium 8.7 (L) 8.9 - 10.3 mg/dL   Total Protein 6.4 (L) 6.5 - 8.1 g/dL    Albumin 3.2 (L) 3.5 - 5.0 g/dL   AST 16 15 - 41 U/L   ALT 13 0 - 44 U/L   Alkaline Phosphatase 68 38 - 126 U/L   Total Bilirubin 0.8 0.3 - 1.2 mg/dL   GFR calc non Af Amer 38 (L) >60 mL/min   GFR calc Af Amer 44 (L) >60 mL/min   Anion gap 11 5 - 15    Comment: Performed at [facility], [address] [place], [place] [address]  CBC with Differential     Status: Abnormal   Collection Time: 03/28/19  9:12 AM  Result Value Ref Range   WBC 4.8 4.0 - 10.5 K/uL   RBC 3.78 (L) 4.22 - 5.81 MIL/uL   Hemoglobin 9.4 (L) 13.0 - 17.0 g/dL   HCT 35.4 (L) 39.0 - 52.0 %   MCV 93.7 80.0 - 100.0 fL   MCH 24.9 (L) 26.0 - 34.0 pg   MCHC 26.6 (L) 30.0 - 36.0 g/dL   RDW 20.2 (H) 11.5 - 15.5 %   Platelets 197 150 - 400 K/uL   nRBC 0.0 0.0 - 0.2 %   Neutrophils Relative % 70 %   Neutro Abs 3.4 1.7 - 7.7 K/uL   Lymphocytes Relative 12 %   Lymphs Abs 0.6 (L) 0.7 - 4.0 K/uL   Monocytes Relative 12 %   Monocytes Absolute 0.6 0.1 - 1.0 K/uL   Eosinophils Relative 4 %   Eosinophils Absolute 0.2 0.0 - 0.5 K/uL   Basophils Relative 2 %   Basophils Absolute 0.1 0.0 - 0.1 K/uL   Immature Granulocytes 0 %   Abs Immature Granulocytes 0.02 0.00 - 0.07 K/uL    Comment: Performed at [facility], [address], [place], [place] [address]  Protime-INR     Status: Abnormal   Collection Time: 03/28/19  9:12 AM  Result Value Ref Range   Prothrombin Time 30.4 (H) 11.4 - 15.2 seconds   INR 2.9 (H) 0.8 - 1.2    Comment: (NOTE) INR goal varies based on device and disease states. Performed at [facility], [address], [place], [place] [address]   Brain natriuretic peptide     Status: Abnormal   Collection Time: 03/28/19  9:12 AM  Result Value Ref Range   B Natriuretic Peptide 276.0 (H) 0.0 - 100.0 pg/mL    Comment: Performed at [facility], [address], [place], [place] [address]  Urinalysis, Routine w reflex microscopic     Status: Abnormal   Collection Time: 03/28/19 10:20 AM  Result Value Ref Range    Color, Urine YELLOW YELLOW   APPearance  CLEAR CLEAR   Specific Gravity, Urine 1.010 1.005 - 1.030   pH 5.0 5.0 - 8.0   Glucose, UA NEGATIVE NEGATIVE mg/dL   Hgb urine dipstick SMALL (A) NEGATIVE   Bilirubin Urine NEGATIVE NEGATIVE   Ketones, ur NEGATIVE NEGATIVE mg/dL   Protein, ur 30 (A) NEGATIVE mg/dL   Nitrite NEGATIVE NEGATIVE   Leukocytes,Ua NEGATIVE NEGATIVE   RBC / HPF 0-5 0 - 5 RBC/hpf   WBC, UA 0-5 0 - 5 WBC/hpf   Bacteria, UA NONE SEEN NONE SEEN   Hyaline Casts, UA PRESENT     Comment: Performed at [facility], [address], [place], [place] [address]  CBG monitoring, ED     Status: Abnormal   Collection Time: 03/28/19 11:52 AM  Result Value Ref Range   Glucose-Capillary 130 (H) 70 - 99 mg/dL    Comment: Glucose reference range applies only to samples taken after fasting for at least 8 hours.   DG Chest 2 View  Result Date: 03/28/2019 CLINICAL DATA:  Shortness of breath EXAM: CHEST - 2 VIEW COMPARISON:  12/25/2018 FINDINGS: Cardiac shadow is enlarged but stable. Aortic calcifications are again seen. Central vascular congestion is noted with mild interstitial edema. No focal infiltrate is seen. Chronic pleural thickening on the left is noted. No acute bony abnormality is seen. IMPRESSION: Changes consistent with mild CHF. Chronic pleural thickening on the left is noted. Electronically Signed   By: Inez Catalina M.D.   On: 03/28/2019 09:46    Pending Labs Unresulted Labs (From admission, onward)    Start     Ordered   03/28/19 1128  Hemoglobin A1c  Once,   STAT    Comments: To assess prior glycemic control    03/28/19 1127          Vitals/Pain Today's Vitals   03/28/19 1230 03/28/19 1257 03/28/19 1300 03/28/19 1330  BP: 126/65  123/71 130/77  Pulse: 79 95  87  Resp: (!) 23 (!) 28  (!) 23  Temp:      TempSrc:      SpO2: 90% 96%    Weight:      Height:        Isolation Precautions No active isolations  Medications Medications  furosemide (LASIX)  injection 80 mg (has no administration in time range)  insulin aspart (novoLOG) injection 0-15 Units (2 Units Subcutaneous Given 03/28/19 1205)  insulin aspart (novoLOG) injection 0-5 Units (has no administration in time range)  furosemide (LASIX) injection 60 mg (60 mg Intravenous Given 03/28/19 1012)  potassium chloride SA (KLOR-CON) CR tablet 40 mEq (40 mEq Oral Given 03/28/19 1012)    Mobility walks Low fall risk   Focused Assessments    R Recommendations: See Admitting Provider Note  Report given to:   Additional Notes:

## 2019-03-29 DIAGNOSIS — D472 Monoclonal gammopathy: Secondary | ICD-10-CM | POA: Diagnosis present

## 2019-03-29 DIAGNOSIS — J9621 Acute and chronic respiratory failure with hypoxia: Secondary | ICD-10-CM | POA: Diagnosis not present

## 2019-03-29 DIAGNOSIS — I272 Pulmonary hypertension, unspecified: Secondary | ICD-10-CM | POA: Diagnosis not present

## 2019-03-29 DIAGNOSIS — I10 Essential (primary) hypertension: Secondary | ICD-10-CM | POA: Diagnosis not present

## 2019-03-29 DIAGNOSIS — N189 Chronic kidney disease, unspecified: Secondary | ICD-10-CM | POA: Diagnosis not present

## 2019-03-29 DIAGNOSIS — I2721 Secondary pulmonary arterial hypertension: Secondary | ICD-10-CM | POA: Diagnosis present

## 2019-03-29 DIAGNOSIS — E785 Hyperlipidemia, unspecified: Secondary | ICD-10-CM | POA: Diagnosis present

## 2019-03-29 DIAGNOSIS — R601 Generalized edema: Secondary | ICD-10-CM | POA: Diagnosis not present

## 2019-03-29 DIAGNOSIS — I509 Heart failure, unspecified: Secondary | ICD-10-CM | POA: Diagnosis not present

## 2019-03-29 DIAGNOSIS — I2781 Cor pulmonale (chronic): Secondary | ICD-10-CM | POA: Diagnosis present

## 2019-03-29 DIAGNOSIS — I50810 Right heart failure, unspecified: Secondary | ICD-10-CM | POA: Diagnosis not present

## 2019-03-29 DIAGNOSIS — I5082 Biventricular heart failure: Secondary | ICD-10-CM | POA: Diagnosis not present

## 2019-03-29 DIAGNOSIS — Z9981 Dependence on supplemental oxygen: Secondary | ICD-10-CM | POA: Diagnosis not present

## 2019-03-29 DIAGNOSIS — I110 Hypertensive heart disease with heart failure: Secondary | ICD-10-CM | POA: Diagnosis not present

## 2019-03-29 DIAGNOSIS — M109 Gout, unspecified: Secondary | ICD-10-CM | POA: Diagnosis present

## 2019-03-29 DIAGNOSIS — Z20822 Contact with and (suspected) exposure to covid-19: Secondary | ICD-10-CM | POA: Diagnosis not present

## 2019-03-29 DIAGNOSIS — I2510 Atherosclerotic heart disease of native coronary artery without angina pectoris: Secondary | ICD-10-CM | POA: Diagnosis not present

## 2019-03-29 DIAGNOSIS — I5021 Acute systolic (congestive) heart failure: Secondary | ICD-10-CM | POA: Diagnosis not present

## 2019-03-29 DIAGNOSIS — Z85038 Personal history of other malignant neoplasm of large intestine: Secondary | ICD-10-CM | POA: Diagnosis not present

## 2019-03-29 DIAGNOSIS — Z9049 Acquired absence of other specified parts of digestive tract: Secondary | ICD-10-CM | POA: Diagnosis not present

## 2019-03-29 DIAGNOSIS — I50813 Acute on chronic right heart failure: Secondary | ICD-10-CM | POA: Diagnosis not present

## 2019-03-29 DIAGNOSIS — I4821 Permanent atrial fibrillation: Secondary | ICD-10-CM | POA: Diagnosis not present

## 2019-03-29 DIAGNOSIS — N183 Chronic kidney disease, stage 3 unspecified: Secondary | ICD-10-CM | POA: Diagnosis not present

## 2019-03-29 DIAGNOSIS — R600 Localized edema: Secondary | ICD-10-CM | POA: Diagnosis not present

## 2019-03-29 DIAGNOSIS — I5033 Acute on chronic diastolic (congestive) heart failure: Secondary | ICD-10-CM | POA: Diagnosis not present

## 2019-03-29 DIAGNOSIS — I482 Chronic atrial fibrillation, unspecified: Secondary | ICD-10-CM | POA: Diagnosis not present

## 2019-03-29 DIAGNOSIS — D631 Anemia in chronic kidney disease: Secondary | ICD-10-CM | POA: Diagnosis present

## 2019-03-29 DIAGNOSIS — Z7901 Long term (current) use of anticoagulants: Secondary | ICD-10-CM | POA: Diagnosis not present

## 2019-03-29 DIAGNOSIS — I361 Nonrheumatic tricuspid (valve) insufficiency: Secondary | ICD-10-CM | POA: Diagnosis not present

## 2019-03-29 DIAGNOSIS — G4733 Obstructive sleep apnea (adult) (pediatric): Secondary | ICD-10-CM | POA: Diagnosis not present

## 2019-03-29 DIAGNOSIS — I428 Other cardiomyopathies: Secondary | ICD-10-CM | POA: Diagnosis not present

## 2019-03-29 DIAGNOSIS — L89152 Pressure ulcer of sacral region, stage 2: Secondary | ICD-10-CM | POA: Diagnosis present

## 2019-03-29 DIAGNOSIS — I5043 Acute on chronic combined systolic (congestive) and diastolic (congestive) heart failure: Secondary | ICD-10-CM | POA: Diagnosis not present

## 2019-03-29 DIAGNOSIS — I130 Hypertensive heart and chronic kidney disease with heart failure and stage 1 through stage 4 chronic kidney disease, or unspecified chronic kidney disease: Secondary | ICD-10-CM | POA: Diagnosis not present

## 2019-03-29 DIAGNOSIS — E1165 Type 2 diabetes mellitus with hyperglycemia: Secondary | ICD-10-CM | POA: Diagnosis not present

## 2019-03-29 DIAGNOSIS — J811 Chronic pulmonary edema: Secondary | ICD-10-CM | POA: Diagnosis not present

## 2019-03-29 DIAGNOSIS — R0602 Shortness of breath: Secondary | ICD-10-CM | POA: Diagnosis not present

## 2019-03-29 DIAGNOSIS — N179 Acute kidney failure, unspecified: Secondary | ICD-10-CM | POA: Diagnosis not present

## 2019-03-29 DIAGNOSIS — E1122 Type 2 diabetes mellitus with diabetic chronic kidney disease: Secondary | ICD-10-CM | POA: Diagnosis not present

## 2019-03-29 DIAGNOSIS — I4891 Unspecified atrial fibrillation: Secondary | ICD-10-CM | POA: Diagnosis not present

## 2019-03-29 DIAGNOSIS — N1831 Chronic kidney disease, stage 3a: Secondary | ICD-10-CM | POA: Diagnosis not present

## 2019-03-29 DIAGNOSIS — E662 Morbid (severe) obesity with alveolar hypoventilation: Secondary | ICD-10-CM | POA: Diagnosis not present

## 2019-03-29 DIAGNOSIS — Z6841 Body Mass Index (BMI) 40.0 and over, adult: Secondary | ICD-10-CM | POA: Diagnosis not present

## 2019-03-29 DIAGNOSIS — E1129 Type 2 diabetes mellitus with other diabetic kidney complication: Secondary | ICD-10-CM | POA: Diagnosis not present

## 2019-03-29 LAB — GLUCOSE, CAPILLARY
Glucose-Capillary: 118 mg/dL — ABNORMAL HIGH (ref 70–99)
Glucose-Capillary: 157 mg/dL — ABNORMAL HIGH (ref 70–99)
Glucose-Capillary: 164 mg/dL — ABNORMAL HIGH (ref 70–99)
Glucose-Capillary: 150 mg/dL — ABNORMAL HIGH (ref 70–99)

## 2019-03-29 LAB — CBC
HCT: 33.7 % — ABNORMAL LOW (ref 39.0–52.0)
Hemoglobin: 9.3 g/dL — ABNORMAL LOW (ref 13.0–17.0)
MCH: 25.2 pg — ABNORMAL LOW (ref 26.0–34.0)
MCHC: 27.6 g/dL — ABNORMAL LOW (ref 30.0–36.0)
MCV: 91.3 fL (ref 80.0–100.0)
Platelets: 198 10*3/uL (ref 150–400)
RBC: 3.69 MIL/uL — ABNORMAL LOW (ref 4.22–5.81)
RDW: 20 % — ABNORMAL HIGH (ref 11.5–15.5)
WBC: 5.1 10*3/uL (ref 4.0–10.5)
nRBC: 0 % (ref 0.0–0.2)

## 2019-03-29 LAB — BASIC METABOLIC PANEL
Anion gap: 8 (ref 5–15)
BUN: 45 mg/dL — ABNORMAL HIGH (ref 8–23)
CO2: 29 mmol/L (ref 22–32)
Calcium: 9 mg/dL (ref 8.9–10.3)
Chloride: 104 mmol/L (ref 98–111)
Creatinine, Ser: 1.67 mg/dL — ABNORMAL HIGH (ref 0.61–1.24)
GFR calc Af Amer: 48 mL/min — ABNORMAL LOW (ref >60)
GFR calc non Af Amer: 42 mL/min — ABNORMAL LOW (ref >60)
Glucose, Bld: 140 mg/dL — ABNORMAL HIGH (ref 70–99)
Potassium: 4.4 mmol/L (ref 3.5–5.1)
Sodium: 141 mmol/L (ref 135–145)

## 2019-03-29 LAB — PROTIME-INR
INR: 2.7 — ABNORMAL HIGH (ref 0.8–1.2)
Prothrombin Time: 28.2 seconds — ABNORMAL HIGH (ref 11.4–15.2)

## 2019-03-29 MED ORDER — GUAIFENESIN-DM 100-10 MG/5ML PO SYRP
5.0000 mL | ORAL_SOLUTION | ORAL | Status: DC | PRN
  Administered 2019-03-29 – 2019-04-03 (×6): 5 mL via ORAL
  Filled 2019-03-29 (×6): qty 5

## 2019-03-29 NOTE — Progress Notes (Signed)
Dicussed with patient wear a CPAP and patient has refused at this time. He explained why and that he  Was supposed to be getting a new machine at home. I informed patient that CPAP will be available for his use and the need to wear his O2 while sleeping at this time.

## 2019-03-29 NOTE — Progress Notes (Signed)
Patient refused CPAP on 2lpm /[place]

## 2019-03-29 NOTE — Care Management Obs Status (Signed)
[place] NOTIFICATION   Patient Details  Name: William Flores MRN: [id number] Date of Birth: 10/07/1950   Medicare Observation Status Notification Given:  Yes    [name] 03/29/2019, 2:25 PM

## 2019-03-29 NOTE — Progress Notes (Signed)
Patient Demographics:    William Flores, is a 69 y.o. male, DOB - February 05, 1950, [id number]  Admit date - 03/28/2019   Admitting Physician Huma Imhoff Denton Brick, MD  Outpatient Primary MD for the patient is [name], [name], MD  LOS - 0   Chief Complaint  Patient presents with  . Abdominal Pain        Subjective:    William Flores today has no fevers, no emesis,  No chest pain,   --Dyspnea on exertion persist -Some orthopnea persist  -efused CPAP overnight --Voiding okay  Assessment  & Plan :    Principal Problem:   Acute on chronic heart failure with preserved ejection fraction (HFpEF) (HCC) Active Problems:   AKI (acute kidney injury) ([place])   Anticoagulated on Coumadin   Atrial fibrillation with slow ventricular response (HCC)   DM (diabetes mellitus), type 2, uncontrolled, with renal complications ([place])   CKD stage 3 due to type 2 diabetes mellitus ([place])   Pulmonary hypertension (HCC)   Hypertensive heart disease   Morbid obesity (HCC)   OSA (obstructive sleep apnea)   Nonischemic cardiomyopathy (HCC)  Brief Summary:- 69 year old with  PMHx significant forHTN, DM2, NICM/cardiomyopathy, CKD 3, atrial fibrillation on chronic anticoagulation, colon cancer,MGUS.On chronic 2 L O2 especially at night--- presenting with weight gain and worsening dyspnea at rest and dyspnea on exertion over the last couple of weeks despite increasing oral diuretics -Admitted for IV diuresis on 03/28/2019 after failing oral diuretics as outpatient  A/p  1)HFpEF/NICM--- acute on chronic preserved EF CHF exacerbation---  --- daily weights, fluid input and output monitoring -Echo from December 2020 with EF of 50 to 55% =--Voiding more, weight is down to 279 pounds from 284.5 pounds -Continue IV Lasix 80 mg every 12 hours -Dyspnea on exertion persist --Significant lower extremity edema/scrotal edema and anasarca  persist -Dyspnea at rest improving, oxygen requirement remains above baseline  2)Acute on chronic respiratory failure with hypoxia secondary to dCHF exacerbation--- PTA patient was on 2 L of oxygen via nasal cannula currently requiring about 3 L/min --- Treat CHF exacerbation as above #1 -Patient refusing CPAP nightly  3)AKI----acute kidney injury on CKD stage - IIIa----- ) worsening renal function due to decreased renal perfusion in the setting of CHF flareup   creatinine on admission= 1.82 , baseline creatinine = 1.3   ,  --Creatinine is down to 1.67 -- renally adjust medications, avoid nephrotoxic agents / dehydration  / hypotension -Hold losartan and Metformin -Monitor renal function closely with aggressive IV Lasix diuresis  4) pulmonary hypertension/morbid obesity/OSA-----CPAP nightly, IV diuresis as above #1 -Patient not compliant with CPAP  5)Chronic atrial fibrillation--- continue Coumadin for stroke prophylaxis, INR is 2.7,continuemetoprolol50 mg twice a day for rate contro and Cardizem for rate control  8)Diabetes--A1c 4.12 December 2018, --Stop Metformin due to kidney concerns -Hold Trulicity --- Continue Lantus and Use Novolog/Humalog Sliding scale insulin with Accu-Cheks/Fingersticks as ordered   Disposition/Need for in-Hospital Stay- patient unable to be discharged at this time due to --- failed outpatient oral diuretics requiring IV diuretics- -Dyspnea on exertion persist --Significant lower extremity edema/scrotal edema and anasarca persist  oxygen requirement remains above baseline -Medically ready for discharge  Code Status : Full  Family Communication:   NA (patient is  alert, awake and coherent)  Consults  :  na  DVT Prophylaxis  : Coumadin- SCDs  Lab Results  Component Value Date   PLT 198 03/29/2019    Inpatient Medications  Scheduled Meds: . allopurinol  300 mg Oral Daily  . atorvastatin  20 mg Oral QPM  . benzonatate  100 mg Oral BID    . diltiazem  180 mg Oral Daily  . ferrous sulfate  325 mg Oral Q breakfast  . furosemide  80 mg Intravenous Q12H  . insulin aspart  0-15 Units Subcutaneous TID WC  . insulin aspart  0-5 Units Subcutaneous QHS  . insulin glargine  18 Units Subcutaneous QHS  . metoprolol tartrate  50 mg Oral BID  . pantoprazole  40 mg Oral Daily  . polyethylene glycol  17 g Oral Daily  . potassium chloride SA  20 mEq Oral Daily  . sodium chloride flush  3 mL Intravenous Q12H  . warfarin  5 mg Oral QPM  . Warfarin - Physician Dosing Inpatient   Does not apply q1800   Continuous Infusions: . sodium chloride     PRN Meds:.sodium chloride, acetaminophen **OR** acetaminophen, acetaminophen, guaiFENesin-dextromethorphan, ondansetron **OR** ondansetron (ZOFRAN) IV, polyethylene glycol, polyvinyl alcohol, sodium chloride flush, traZODone    Anti-infectives (From admission, onward)   None        Objective:   Vitals:   03/29/19 0430 03/29/19 0500 03/29/19 0830 03/29/19 1230  BP: 134/81   113/63  Pulse: 95   61  Resp:    19  Temp: 97.9 F (36.6 C)   98.7 F (37.1 C)  TempSrc: Oral   Oral  SpO2: 98%  96% 96%  Weight:  126.6 kg    Height:        Wt Readings from Last 3 Encounters:  03/29/19 126.6 kg  03/08/19 110.2 kg  02/06/19 110.4 kg     Intake/Output Summary (Last 24 hours) at 03/29/2019 1730 Last data filed at 03/29/2019 1700 Gross per 24 hour  Intake 480 ml  Output 2100 ml  Net -1620 ml     Physical Exam  Gen:- Awake Alert, morbidly obese, dyspnea on exertion HEENT:- [place].AT, No sclera icterus Nose- [place] 3L/min Neck-Supple Neck,No JVD,.  Lungs-diminished in bases with faint rales, no wheezing CV- S1, S2 normal, regular  Abd-  +ve B.Sounds, Abd Soft, No tenderness, increased truncal adiposity, anterior lower abdominal wall pitting edema/induration    Extremity/Skin:-3+ pitting edema/anasarca, pedal pulses present  Psych-affect is appropriate, oriented x3 Neuro-generalized  weakness, no new focal deficits, no tremors GU--scrotal and penile edema   Data Review:   Micro Results Recent Results (from the past 240 hour(s))  SARS CORONAVIRUS 2 (TAT 6-24 HRS) Nasopharyngeal Nasopharyngeal Swab     Status: None   Collection Time: 03/28/19  3:07 PM   Specimen: Nasopharyngeal Swab  Result Value Ref Range Status   SARS Coronavirus 2 NEGATIVE NEGATIVE Final    Comment: (NOTE) SARS-CoV-2 target nucleic acids are NOT DETECTED. The SARS-CoV-2 RNA is generally detectable in upper and lower respiratory specimens during the acute phase of infection. Negative results do not preclude SARS-CoV-2 infection, do not rule out co-infections with other pathogens, and should not be used as the sole basis for treatment or other patient management decisions. Negative results must be combined with clinical observations, patient history, and epidemiological information. The expected result is Negative. Fact Sheet for Patients: SugarRoll.be Fact Sheet for Healthcare Providers: https://www.woods-mathews.com/ This test is not yet approved or cleared by the  [place] Architectural technologist and  has been authorized for detection and/or diagnosis of SARS-CoV-2 by FDA under an Print production planner (EUA). This EUA will remain  in effect (meaning this test can be used) for the duration of the COVID-19 declaration under Section 56 4(b)(1) of the Act, 21 U.S.C. section 360bbb-3(b)(1), unless the authorization is terminated or revoked sooner. Performed at [facility], [place] [address] [place], [place] [address]     Radiology Reports DG Chest 2 View  Result Date: 03/28/2019 CLINICAL DATA:  Shortness of breath EXAM: CHEST - 2 VIEW COMPARISON:  12/25/2018 FINDINGS: Cardiac shadow is enlarged but stable. Aortic calcifications are again seen. Central vascular congestion is noted with mild interstitial edema. No focal infiltrate is seen. Chronic pleural  thickening on the left is noted. No acute bony abnormality is seen. IMPRESSION: Changes consistent with mild CHF. Chronic pleural thickening on the left is noted. Electronically Signed   By: Inez Catalina M.D.   On: 03/28/2019 09:46     CBC Recent Labs  Lab 03/28/19 0912 03/29/19 0446  WBC 4.8 5.1  HGB 9.4* 9.3*  HCT 35.4* 33.7*  PLT 197 198  MCV 93.7 91.3  MCH 24.9* 25.2*  MCHC 26.6* 27.6*  RDW 20.2* 20.0*  LYMPHSABS 0.6*  --   MONOABS 0.6  --   EOSABS 0.2  --   BASOSABS 0.1  --     Chemistries  Recent Labs  Lab 03/28/19 0912 03/29/19 0446  NA 141 141  K 3.9 4.4  CL 103 104  CO2 27 29  GLUCOSE 137* 140*  BUN 50* 45*  CREATININE 1.82* 1.67*  CALCIUM 8.7* 9.0  AST 16  --   ALT 13  --   ALKPHOS 68  --   BILITOT 0.8  --    ------------------------------------------------------------------------------------------------------------------ No results for input(s): CHOL, HDL, LDLCALC, TRIG, CHOLHDL, LDLDIRECT in the last 72 hours.  Lab Results  Component Value Date   HGBA1C 6.9 (H) 03/28/2019   ------------------------------------------------------------------------------------------------------------------ No results for input(s): TSH, T4TOTAL, T3FREE, THYROIDAB in the last 72 hours.  Invalid input(s): FREET3 ------------------------------------------------------------------------------------------------------------------ No results for input(s): VITAMINB12, FOLATE, FERRITIN, TIBC, IRON, RETICCTPCT in the last 72 hours.  Coagulation profile Recent Labs  Lab 03/28/19 0912 03/29/19 0446  INR 2.9* 2.7*    No results for input(s): DDIMER in the last 72 hours.  Cardiac Enzymes No results for input(s): CKMB, TROPONINI, MYOGLOBIN in the last 168 hours.  Invalid input(s): CK ------------------------------------------------------------------------------------------------------------------    Component Value Date/Time   BNP 276.0 (H) 03/28/2019 2248      Roxan Hockey M.D on 03/29/2019 at 5:30 PM  Go to [email or web address] - for contact info  [facility] - Office  [phone]

## 2019-03-30 LAB — BASIC METABOLIC PANEL
Anion gap: 10 (ref 5–15)
BUN: 50 mg/dL — ABNORMAL HIGH (ref 8–23)
CO2: 29 mmol/L (ref 22–32)
Calcium: 9 mg/dL (ref 8.9–10.3)
Chloride: 100 mmol/L (ref 98–111)
Creatinine, Ser: 1.96 mg/dL — ABNORMAL HIGH (ref 0.61–1.24)
GFR calc Af Amer: 40 mL/min — ABNORMAL LOW (ref >60)
GFR calc non Af Amer: 34 mL/min — ABNORMAL LOW (ref >60)
Glucose, Bld: 208 mg/dL — ABNORMAL HIGH (ref 70–99)
Potassium: 4.3 mmol/L (ref 3.5–5.1)
Sodium: 139 mmol/L (ref 135–145)

## 2019-03-30 LAB — PROTIME-INR
INR: 2.2 — ABNORMAL HIGH (ref 0.8–1.2)
Prothrombin Time: 24 seconds — ABNORMAL HIGH (ref 11.4–15.2)

## 2019-03-30 LAB — GLUCOSE, CAPILLARY
Glucose-Capillary: 189 mg/dL — ABNORMAL HIGH (ref 70–99)
Glucose-Capillary: 129 mg/dL — ABNORMAL HIGH (ref 70–99)
Glucose-Capillary: 131 mg/dL — ABNORMAL HIGH (ref 70–99)
Glucose-Capillary: 143 mg/dL — ABNORMAL HIGH (ref 70–99)

## 2019-03-30 MED ORDER — FUROSEMIDE 10 MG/ML IJ SOLN
60.0000 mg | Freq: Two times a day (BID) | INTRAMUSCULAR | Status: DC
  Administered 2019-03-30 – 2019-04-02 (×6): 60 mg via INTRAVENOUS
  Filled 2019-03-30 (×7): qty 6

## 2019-03-30 NOTE — Progress Notes (Signed)
Patient Demographics:    William Flores, is a 69 y.o. male, DOB - Feb 10, 1950, EQA:[id number]  Admit date - 03/28/2019   Admitting Physician Waldron Gerry William Brick, MD  Outpatient Primary MD for the patient is [name], William Kaufmann, MD  LOS - 1   Chief Complaint  Patient presents with  . Abdominal Pain        Subjective:    William Flores today has no fevers, no emesis,  No chest pain,   -Remains noncompliant with CPAP -Voiding okay DOE and some orthopnea persist-- Weight documentation is inconsistent    Assessment  & Plan :    Principal Problem:   Acute on chronic heart failure with preserved ejection fraction (HFpEF) (HCC) Active Problems:   AKI (acute kidney injury) (William Flores)   Anticoagulated on Coumadin   Atrial fibrillation with slow ventricular response (HCC)   DM (diabetes mellitus), type 2, uncontrolled, with renal complications (William Flores)   CKD stage 3 due to type 2 diabetes mellitus (William Flores)   Pulmonary hypertension (HCC)   Hypertensive heart disease   Morbid obesity (HCC)   OSA (obstructive sleep apnea)   Nonischemic cardiomyopathy (HCC)   Acute exacerbation of CHF (congestive heart failure) (HCC)  Brief Summary:- 69 year old with  PMHx significant forHTN, DM2, NICM/cardiomyopathy, CKD 3, atrial fibrillation on chronic anticoagulation, colon cancer,MGUS.On chronic 2 L O2 especially at night--- presenting with weight gain and worsening dyspnea at rest and dyspnea on exertion over the last couple of weeks despite increasing oral diuretics -Admitted for IV diuresis on 03/28/2019 after failing oral diuretics as outpatient   A/p  1)HFpEF/NICM--- acute on chronic preserved EF CHF exacerbation---  --- daily weights, fluid input and output monitoring -Echo from December 2020 with EF of 50 to 55% --DOE and some orthopnea persist-- Weight documentation is inconsistent  -Decrease IV Lasix to 60 mg every 12  hours -Dyspnea on exertion persist --Significant lower extremity edema/scrotal edema and anasarca persist oxygen requirement remains above baseline  2)Acute on chronic respiratory failure with hypoxia secondary to dCHF exacerbation--- PTA patient was on 2 L of oxygen via nasal cannula currently requiring about 3 L/min --- Treat CHF exacerbation as above #1 -Patient refusing CPAP nightly  3)AKI----acute kidney injury on CKD stage - IIIa-----  worsening renal function due to decreased renal perfusion in the setting of CHF flareup   creatinine on admission= 1.82 , baseline creatinine = 1.3   ,  --Creatinine is now 1.9 -- renally adjust medications, avoid nephrotoxic agents / dehydration  / hypotension -Hold losartan and Metformin -Monitor renal function closely with aggressive IV Lasix diuresis  4)Pulmonary Hypertension/morbid Obesity/OSA-----CPAP nightly, IV diuresis as above #1 -Patient not compliant with CPAP  5)Chronic atrial fibrillation--- continue Coumadin for stroke prophylaxis, INR is 2.2,continuemetoprolol50 mg twice a day for rate control and Cardizem CD 180 mg for rate control  8)Diabetes--A1c 4.12 December 2018, --Stop Metformin due to kidney concerns -Hold Trulicity --- Continue Lantus and Use Novolog/Humalog Sliding scale insulin with Accu-Cheks/Fingersticks as ordered   Disposition/Need for in-Hospital Stay- patient unable to be discharged at this time due to --- failed outpatient oral diuretics requiring IV diuretics- -Dyspnea on exertion persist --Significant lower extremity edema/scrotal edema and anasarca persist  oxygen requirement remains above baseline -Creatinine above baseline -Medically Not  ready for discharge  Code Status : Full  Family Communication:   NA (patient is alert, awake and coherent)  Consults  :  na  DVT Prophylaxis  : Coumadin- SCDs  Lab Results  Component Value Date   PLT 198 03/29/2019    Inpatient  Medications  Scheduled Meds: . allopurinol  300 mg Oral Daily  . atorvastatin  20 mg Oral QPM  . benzonatate  100 mg Oral BID  . diltiazem  180 mg Oral Daily  . ferrous sulfate  325 mg Oral Q breakfast  . furosemide  60 mg Intravenous Q12H  . insulin aspart  0-15 Units Subcutaneous TID WC  . insulin aspart  0-5 Units Subcutaneous QHS  . insulin glargine  18 Units Subcutaneous QHS  . metoprolol tartrate  50 mg Oral BID  . pantoprazole  40 mg Oral Daily  . polyethylene glycol  17 g Oral Daily  . potassium chloride SA  20 mEq Oral Daily  . sodium chloride flush  3 mL Intravenous Q12H  . warfarin  5 mg Oral QPM  . Warfarin - Physician Dosing Inpatient   Does not apply q1800   Continuous Infusions: . sodium chloride     PRN Meds:.sodium chloride, acetaminophen **OR** acetaminophen, acetaminophen, guaiFENesin-dextromethorphan, ondansetron **OR** ondansetron (ZOFRAN) IV, polyethylene glycol, polyvinyl alcohol, sodium chloride flush, traZODone   Anti-infectives (From admission, onward)   None       Objective:   Vitals:   03/30/19 0536 03/30/19 0753 03/30/19 1000 03/30/19 1534  BP: 130/66   125/60  Pulse: 74   70  Resp:    20  Temp: (!) 97.5 F (36.4 C)   98.4 F (36.9 C)  TempSrc: Axillary   Oral  SpO2: 94% 92%  94%  Weight:   129.5 kg   Height:        Wt Readings from Last 3 Encounters:  03/30/19 129.5 kg  03/08/19 110.2 kg  02/06/19 110.4 kg     Intake/Output Summary (Last 24 hours) at 03/30/2019 1817 Last data filed at 03/30/2019 0530 Gross per 24 hour  Intake 960 ml  Output 600 ml  Net 360 ml    Physical Exam  Gen:- Awake Alert, morbidly obese, dyspnea on exertion HEENT:- [place].AT, No sclera icterus Nose- [place] 3L/min Neck-Supple Neck,No JVD,.  Lungs-diminished in bases with faint rales, no wheezing CV- S1, S2 normal, regular  Abd-  +ve B.Sounds, Abd Soft, No tenderness, increased truncal adiposity, anterior lower abdominal wall pitting edema/induration     Extremity/Skin:-3+ pitting edema/anasarca, pedal pulses present  Psych-affect is appropriate, oriented x3 Neuro-generalized weakness, no new focal deficits, no tremors GU--significant scrotal and penile edema   Data Review:   Micro Results Recent Results (from the past 240 hour(s))  SARS CORONAVIRUS 2 (TAT 6-24 HRS) Nasopharyngeal Nasopharyngeal Swab     Status: None   Collection Time: 03/28/19  3:07 PM   Specimen: Nasopharyngeal Swab  Result Value Ref Range Status   SARS Coronavirus 2 NEGATIVE NEGATIVE Final    Comment: (NOTE) SARS-CoV-2 target nucleic acids are NOT DETECTED. The SARS-CoV-2 RNA is generally detectable in upper and lower respiratory specimens during the acute phase of infection. Negative results do not preclude SARS-CoV-2 infection, do not rule out co-infections with other pathogens, and should not be used as the sole basis for treatment or other patient management decisions. Negative results must be combined with clinical observations, patient history, and epidemiological information. The expected result is Negative. Fact Sheet for Patients: SugarRoll.be Fact Sheet  for Healthcare Providers: https://www.woods-mathews.com/ This test is not yet approved or cleared by the [place] and  has been authorized for detection and/or diagnosis of SARS-CoV-2 by FDA under an Emergency Use Authorization (EUA). This EUA will remain  in effect (meaning this test can be used) for the duration of the COVID-19 declaration under Section 56 4(b)(1) of the Act, 21 U.S.C. section 360bbb-3(b)(1), unless the authorization is terminated or revoked sooner. Performed at [facility], [place] [address] [place], [place] [address]     Radiology Reports DG Chest 2 View  Result Date: 03/28/2019 CLINICAL DATA:  Shortness of breath EXAM: CHEST - 2 VIEW COMPARISON:  12/25/2018 FINDINGS: Cardiac shadow is enlarged but stable. Aortic  calcifications are again seen. Central vascular congestion is noted with mild interstitial edema. No focal infiltrate is seen. Chronic pleural thickening on the left is noted. No acute bony abnormality is seen. IMPRESSION: Changes consistent with mild CHF. Chronic pleural thickening on the left is noted. Electronically Signed   By: Inez Catalina M.D.   On: 03/28/2019 09:46     CBC Recent Labs  Lab 03/28/19 0912 03/29/19 0446  WBC 4.8 5.1  HGB 9.4* 9.3*  HCT 35.4* 33.7*  PLT 197 198  MCV 93.7 91.3  MCH 24.9* 25.2*  MCHC 26.6* 27.6*  RDW 20.2* 20.0*  LYMPHSABS 0.6*  --   MONOABS 0.6  --   EOSABS 0.2  --   BASOSABS 0.1  --     Chemistries  Recent Labs  Lab 03/28/19 0912 03/29/19 0446 03/30/19 0552  NA 141 141 139  K 3.9 4.4 4.3  CL 103 104 100  CO2 27 29 29   GLUCOSE 137* 140* 208*  BUN 50* 45* 50*  CREATININE 1.82* 1.67* 1.96*  CALCIUM 8.7* 9.0 9.0  AST 16  --   --   ALT 13  --   --   ALKPHOS 68  --   --   BILITOT 0.8  --   --    ------------------------------------------------------------------------------------------------------------------ No results for input(s): CHOL, HDL, LDLCALC, TRIG, CHOLHDL, LDLDIRECT in the last 72 hours.  Lab Results  Component Value Date   HGBA1C 6.9 (H) 03/28/2019   ------------------------------------------------------------------------------------------------------------------ No results for input(s): TSH, T4TOTAL, T3FREE, THYROIDAB in the last 72 hours.  Invalid input(s): FREET3 ------------------------------------------------------------------------------------------------------------------ No results for input(s): VITAMINB12, FOLATE, FERRITIN, TIBC, IRON, RETICCTPCT in the last 72 hours.  Coagulation profile Recent Labs  Lab 03/28/19 0912 03/29/19 0446 03/30/19 0552  INR 2.9* 2.7* 2.2*    No results for input(s): DDIMER in the last 72 hours.  Cardiac Enzymes No results for input(s): CKMB, TROPONINI, MYOGLOBIN in the  last 168 hours.  Invalid input(s): CK ------------------------------------------------------------------------------------------------------------------    Component Value Date/Time   BNP 276.0 (H) 03/28/2019 6606     Roxan Hockey M.D on 03/30/2019 at 6:17 PM  Go to [email or web address] - for contact info  [facility] - Office  [phone]

## 2019-03-31 LAB — BASIC METABOLIC PANEL
Anion gap: 8 (ref 5–15)
BUN: 56 mg/dL — ABNORMAL HIGH (ref 8–23)
CO2: 29 mmol/L (ref 22–32)
Calcium: 8.9 mg/dL (ref 8.9–10.3)
Chloride: 103 mmol/L (ref 98–111)
Creatinine, Ser: 1.81 mg/dL — ABNORMAL HIGH (ref 0.61–1.24)
GFR calc Af Amer: 44 mL/min — ABNORMAL LOW (ref >60)
GFR calc non Af Amer: 38 mL/min — ABNORMAL LOW (ref >60)
Glucose, Bld: 123 mg/dL — ABNORMAL HIGH (ref 70–99)
Potassium: 4.7 mmol/L (ref 3.5–5.1)
Sodium: 140 mmol/L (ref 135–145)

## 2019-03-31 LAB — GLUCOSE, CAPILLARY
Glucose-Capillary: 110 mg/dL — ABNORMAL HIGH (ref 70–99)
Glucose-Capillary: 117 mg/dL — ABNORMAL HIGH (ref 70–99)
Glucose-Capillary: 165 mg/dL — ABNORMAL HIGH (ref 70–99)

## 2019-03-31 LAB — PROTIME-INR
INR: 2.1 — ABNORMAL HIGH (ref 0.8–1.2)
Prothrombin Time: 23.5 seconds — ABNORMAL HIGH (ref 11.4–15.2)

## 2019-03-31 LAB — MAGNESIUM: Magnesium: 2.2 mg/dL (ref 1.7–2.4)

## 2019-03-31 MED ORDER — BISACODYL 5 MG PO TBEC
10.0000 mg | DELAYED_RELEASE_TABLET | Freq: Once | ORAL | Status: AC
  Administered 2019-03-31: 10 mg via ORAL
  Filled 2019-03-31: qty 2

## 2019-03-31 NOTE — Progress Notes (Signed)
Patient refuses CPAP 

## 2019-03-31 NOTE — Progress Notes (Signed)
Patient Demographics:    William Flores, is a 69 y.o. male, DOB - 06/14/1950, OIN:[id number]  Admit date - 03/28/2019   Admitting Physician Johm Pfannenstiel Denton Brick, MD  Outpatient Primary MD for the patient is [name], [name], MD  LOS - 2   Chief Complaint  Patient presents with  . Abdominal Pain        Subjective:    William Flores today has no fevers, no emesis,  No chest pain,    --Continues to refuse CPAP nightly  -Fluid balance documentation is inaccurate-no fluid intake documented only ", as per patient output documentation appears to be also inaccurate  -Patient weight documentation also appears inaccurate from the best I can gather he probably weighs about  285 pounds right now  --"Patient states I am  full of fluid,  having trouble breathing, I think I need to see a heart doctor"   Assessment  & Plan :    Principal Problem:   Acute on chronic heart failure with preserved ejection fraction (HFpEF) (HCC) Active Problems:   AKI (acute kidney injury) ([place])   Anticoagulated on Coumadin   Atrial fibrillation with slow ventricular response (HCC)   DM (diabetes mellitus), type 2, uncontrolled, with renal complications ([place])   CKD stage 3 due to type 2 diabetes mellitus ([place])   Pulmonary hypertension ([place])   Hypertensive heart disease   Morbid obesity (HCC)   OSA (obstructive sleep apnea)   Nonischemic cardiomyopathy ([place])   Acute exacerbation of CHF (congestive heart failure) (HCC)  Brief Summary:- 69 year old with  PMHx significant forHTN, DM2, NICM/cardiomyopathy, CKD 3, atrial fibrillation on chronic anticoagulation, colon cancer,MGUS.On chronic 2 L O2 especially at night--- presenting with weight gain and worsening dyspnea at rest and dyspnea on exertion over the last couple of weeks despite increasing oral diuretics -Admitted for IV diuresis on 03/28/2019 after failing oral diuretics as  outpatient   A/p  1)HFpEF/NICM--- acute on chronic preserved EF CHF exacerbation---  --- daily weights, fluid input and output monitoring -Echo from December 2020 with EF of 50 to 55% --DOE and some orthopnea persist-- Weight documentation is inconsistent  --Weight and fluid input/output documentation remains inaccurate please see subjective above -c/n  IV Lasix to 60 mg every 12 hours -Dyspnea on exertion persist --Significant lower extremity edema/scrotal edema and anasarca persist oxygen requirement remains above baseline -Patient is requesting cardiology input --She needs to use 3 L of oxygen via nasal cannula  2)Acute on chronic respiratory failure with hypoxia secondary to dCHF exacerbation--- PTA patient was on 2 L of oxygen via nasal cannula currently requiring about 3 L/min --- Treat CHF exacerbation as above #1 -Patient refusing CPAP nightly  3)AKI----acute kidney injury on CKD stage - IIIa-----  worsening renal function due to decreased renal perfusion in the setting of CHF flareup   creatinine on admission= 1.82 , baseline creatinine = 1.3   ,  --Creatinine is now 1.81 -- renally adjust medications, avoid nephrotoxic agents / dehydration  / hypotension -Hold losartan and Metformin -Monitor renal function closely with aggressive IV Lasix diuresis  4)Pulmonary Hypertension/morbid Obesity/OSA-----CPAP nightly, IV diuresis as above #1 -Patient not compliant with CPAP  5)Chronic atrial fibrillation--- continue Coumadin for stroke prophylaxis, INR is 2.2,continuemetoprolol50 mg twice a day for  rate control and Cardizem CD 180 mg for rate control  8)Diabetes--A1c 4.12 December 2018, --Stop Metformin due to kidney concerns -Hold Trulicity --- Continue Lantus and Use Novolog/Humalog Sliding scale insulin with Accu-Cheks/Fingersticks as ordered   Disposition/Need for in-Hospital Stay- patient unable to be discharged at this time due to --- failed outpatient oral  diuretics requiring IV diuretics- -Dyspnea on exertion persist --Significant lower extremity edema/scrotal edema and anasarca persist---Weight and fluid input/output documentation remains inaccurate please see subjective above-----patient is requesting cardiology consult/input  oxygen requirement remains above baseline -Creatinine above baseline -Medically Not ready for discharge  Code Status : Full  Family Communication:   NA (patient is alert, awake and coherent)  Consults  :  na  DVT Prophylaxis  : Coumadin- SCDs  Lab Results  Component Value Date   PLT 198 03/29/2019    Inpatient Medications  Scheduled Meds: . allopurinol  300 mg Oral Daily  . atorvastatin  20 mg Oral QPM  . benzonatate  100 mg Oral BID  . diltiazem  180 mg Oral Daily  . ferrous sulfate  325 mg Oral Q breakfast  . furosemide  60 mg Intravenous Q12H  . insulin aspart  0-15 Units Subcutaneous TID WC  . insulin aspart  0-5 Units Subcutaneous QHS  . insulin glargine  18 Units Subcutaneous QHS  . metoprolol tartrate  50 mg Oral BID  . pantoprazole  40 mg Oral Daily  . polyethylene glycol  17 g Oral Daily  . potassium chloride SA  20 mEq Oral Daily  . sodium chloride flush  3 mL Intravenous Q12H  . warfarin  5 mg Oral QPM  . Warfarin - Physician Dosing Inpatient   Does not apply q1800   Continuous Infusions: . sodium chloride     PRN Meds:.sodium chloride, acetaminophen **OR** acetaminophen, acetaminophen, guaiFENesin-dextromethorphan, ondansetron **OR** ondansetron (ZOFRAN) IV, polyethylene glycol, polyvinyl alcohol, sodium chloride flush, traZODone   Anti-infectives (From admission, onward)   None       Objective:   Vitals:   03/31/19 0500 03/31/19 0508 03/31/19 0820 03/31/19 1409  BP:  (!) 103/56  126/66  Pulse:  76  68  Resp:  16  20  Temp:    98.7 F (37.1 C)  TempSrc:    Oral  SpO2:  91%  92%  Weight: 117.3 kg  129.2 kg   Height:        Wt Readings from Last 3 Encounters:  03/31/19  129.2 kg  03/08/19 110.2 kg  02/06/19 110.4 kg     Intake/Output Summary (Last 24 hours) at 03/31/2019 1546 Last data filed at 03/31/2019 0700 Gross per 24 hour  Intake --  Output 1000 ml  Net -1000 ml    Physical Exam  Gen:- Awake Alert, morbidly obese, dyspnea on exertion HEENT:- [place].AT, No sclera icterus Nose- [place] 3L/min Neck-Supple Neck,No JVD,.  Lungs-diminished in bases with faint rales, no wheezing CV- S1, S2 normal, regular  Abd-  +ve B.Sounds, Abd Soft, No tenderness, increased truncal adiposity, anterior lower abdominal wall pitting edema/induration    Extremity/Skin:  2 to 3+ pitting edema/anasarca, pedal pulses present  Psych-affect is appropriate, oriented x3 Neuro-generalized weakness, no new focal deficits, no tremors GU--significant scrotal and penile edema   Data Review:   Micro Results Recent Results (from the past 240 hour(s))  SARS CORONAVIRUS 2 (TAT 6-24 HRS) Nasopharyngeal Nasopharyngeal Swab     Status: None   Collection Time: 03/28/19  3:07 PM   Specimen: Nasopharyngeal Swab  Result  Value Ref Range Status   SARS Coronavirus 2 NEGATIVE NEGATIVE Final    Comment: (NOTE) SARS-CoV-2 target nucleic acids are NOT DETECTED. The SARS-CoV-2 RNA is generally detectable in upper and lower respiratory specimens during the acute phase of infection. Negative results do not preclude SARS-CoV-2 infection, do not rule out co-infections with other pathogens, and should not be used as the sole basis for treatment or other patient management decisions. Negative results must be combined with clinical observations, patient history, and epidemiological information. The expected result is Negative. Fact Sheet for Patients: SugarRoll.be Fact Sheet for Healthcare Providers: https://www.woods-mathews.com/ This test is not yet approved or cleared by the [place] FDA and  has been authorized for detection and/or diagnosis of  SARS-CoV-2 by FDA under an Emergency Use Authorization (EUA). This EUA will remain  in effect (meaning this test can be used) for the duration of the COVID-19 declaration under Section 56 4(b)(1) of the Act, 21 U.S.C. section 360bbb-3(b)(1), unless the authorization is terminated or revoked sooner. Performed at [facility], [place] [address], [place], [place] [address]    Radiology Reports DG Chest 2 View  Result Date: 03/28/2019 CLINICAL DATA:  Shortness of breath EXAM: CHEST - 2 VIEW COMPARISON:  12/25/2018 FINDINGS: Cardiac shadow is enlarged but stable. Aortic calcifications are again seen. Central vascular congestion is noted with mild interstitial edema. No focal infiltrate is seen. Chronic pleural thickening on the left is noted. No acute bony abnormality is seen. IMPRESSION: Changes consistent with mild CHF. Chronic pleural thickening on the left is noted. Electronically Signed   By: Inez Catalina M.D.   On: 03/28/2019 09:46     CBC Recent Labs  Lab 03/28/19 0912 03/29/19 0446  WBC 4.8 5.1  HGB 9.4* 9.3*  HCT 35.4* 33.7*  PLT 197 198  MCV 93.7 91.3  MCH 24.9* 25.2*  MCHC 26.6* 27.6*  RDW 20.2* 20.0*  LYMPHSABS 0.6*  --   MONOABS 0.6  --   EOSABS 0.2  --   BASOSABS 0.1  --     Chemistries  Recent Labs  Lab 03/28/19 0912 03/29/19 0446 03/30/19 0552 03/31/19 0530  NA 141 141 139 140  K 3.9 4.4 4.3 4.7  CL 103 104 100 103  CO2 27 29 29 29   GLUCOSE 137* 140* 208* 123*  BUN 50* 45* 50* 56*  CREATININE 1.82* 1.67* 1.96* 1.81*  CALCIUM 8.7* 9.0 9.0 8.9  MG  --   --   --  2.2  AST 16  --   --   --   ALT 13  --   --   --   ALKPHOS 68  --   --   --   BILITOT 0.8  --   --   --    ------------------------------------------------------------------------------------------------------------------ No results for input(s): CHOL, HDL, LDLCALC, TRIG, CHOLHDL, LDLDIRECT in the last 72 hours.  Lab Results  Component Value Date   HGBA1C 6.9 (H) 03/28/2019    ------------------------------------------------------------------------------------------------------------------ No results for input(s): TSH, T4TOTAL, T3FREE, THYROIDAB in the last 72 hours.  Invalid input(s): FREET3 ------------------------------------------------------------------------------------------------------------------ No results for input(s): VITAMINB12, FOLATE, FERRITIN, TIBC, IRON, RETICCTPCT in the last 72 hours.  Coagulation profile Recent Labs  Lab 03/28/19 0912 03/29/19 0446 03/30/19 0552 03/31/19 0530  INR 2.9* 2.7* 2.2* 2.1*    No results for input(s): DDIMER in the last 72 hours.  Cardiac Enzymes No results for input(s): CKMB, TROPONINI, MYOGLOBIN in the last 168 hours.  Invalid input(s): CK ------------------------------------------------------------------------------------------------------------------  Component Value Date/Time   BNP 276.0 (H) 03/28/2019 6834   Roxan Hockey M.D on 03/31/2019 at 3:46 PM  Go to [email or web address] - for contact info  [facility] - Office  [phone]

## 2019-04-01 ENCOUNTER — Inpatient Hospital Stay (HOSPITAL_COMMUNITY): Payer: Medicare Other

## 2019-04-01 DIAGNOSIS — I5033 Acute on chronic diastolic (congestive) heart failure: Secondary | ICD-10-CM

## 2019-04-01 DIAGNOSIS — I361 Nonrheumatic tricuspid (valve) insufficiency: Secondary | ICD-10-CM

## 2019-04-01 DIAGNOSIS — G4733 Obstructive sleep apnea (adult) (pediatric): Secondary | ICD-10-CM

## 2019-04-01 DIAGNOSIS — I10 Essential (primary) hypertension: Secondary | ICD-10-CM

## 2019-04-01 DIAGNOSIS — N183 Chronic kidney disease, stage 3 unspecified: Secondary | ICD-10-CM

## 2019-04-01 DIAGNOSIS — I482 Chronic atrial fibrillation, unspecified: Secondary | ICD-10-CM

## 2019-04-01 LAB — GLUCOSE, CAPILLARY
Glucose-Capillary: 166 mg/dL — ABNORMAL HIGH (ref 70–99)
Glucose-Capillary: 101 mg/dL — ABNORMAL HIGH (ref 70–99)
Glucose-Capillary: 127 mg/dL — ABNORMAL HIGH (ref 70–99)
Glucose-Capillary: 170 mg/dL — ABNORMAL HIGH (ref 70–99)
Glucose-Capillary: 157 mg/dL — ABNORMAL HIGH (ref 70–99)
Glucose-Capillary: 149 mg/dL — ABNORMAL HIGH (ref 70–99)

## 2019-04-01 LAB — BASIC METABOLIC PANEL
Anion gap: 8 (ref 5–15)
BUN: 61 mg/dL — ABNORMAL HIGH (ref 8–23)
CO2: 30 mmol/L (ref 22–32)
Calcium: 9 mg/dL (ref 8.9–10.3)
Chloride: 100 mmol/L (ref 98–111)
Creatinine, Ser: 1.93 mg/dL — ABNORMAL HIGH (ref 0.61–1.24)
GFR calc Af Amer: 41 mL/min — ABNORMAL LOW (ref >60)
GFR calc non Af Amer: 35 mL/min — ABNORMAL LOW (ref >60)
Glucose, Bld: 124 mg/dL — ABNORMAL HIGH (ref 70–99)
Potassium: 4.6 mmol/L (ref 3.5–5.1)
Sodium: 138 mmol/L (ref 135–145)

## 2019-04-01 LAB — CBC
HCT: 36.1 % — ABNORMAL LOW (ref 39.0–52.0)
Hemoglobin: 9.6 g/dL — ABNORMAL LOW (ref 13.0–17.0)
MCH: 24.2 pg — ABNORMAL LOW (ref 26.0–34.0)
MCHC: 26.6 g/dL — ABNORMAL LOW (ref 30.0–36.0)
MCV: 90.9 fL (ref 80.0–100.0)
Platelets: 206 10*3/uL (ref 150–400)
RBC: 3.97 MIL/uL — ABNORMAL LOW (ref 4.22–5.81)
RDW: 19.9 % — ABNORMAL HIGH (ref 11.5–15.5)
WBC: 5 10*3/uL (ref 4.0–10.5)
nRBC: 0 % (ref 0.0–0.2)

## 2019-04-01 LAB — ECHOCARDIOGRAM COMPLETE
Height: 64 in
Weight: 4595.2 oz

## 2019-04-01 LAB — PROTIME-INR
INR: 2 — ABNORMAL HIGH (ref 0.8–1.2)
Prothrombin Time: 22.4 seconds — ABNORMAL HIGH (ref 11.4–15.2)

## 2019-04-01 MED ORDER — PERFLUTREN LIPID MICROSPHERE
1.0000 mL | INTRAVENOUS | Status: AC | PRN
  Administered 2019-04-01: 2 mL via INTRAVENOUS
  Filled 2019-04-01: qty 10

## 2019-04-01 NOTE — Progress Notes (Signed)
Patient Demographics:    William Flores, is a 69 y.o. male, DOB - [birth year], HER:[id number]  Admit date - 03/28/2019   Admitting Physician Attikus Bartoszek Denton Brick, MD  Outpatient Primary MD for the patient is [name], [name], MD  LOS - 3   Chief Complaint  Patient presents with  . Abdominal Pain        Subjective:    William Flores today has no fevers, no emesis,  No chest pain,    Shob, DOE persist --Refused CPAP again last night -Voiding okay -Fluid balance inaccurate as patient is voiding on the floor from time to time  --Cardiology consult from Dr. Johnsie Cancel appreciated recommends additional IV diuresis   Assessment  & Plan :    Principal Problem:   Acute on chronic heart failure with preserved ejection fraction (HFpEF) (HCC) Active Problems:   AKI (acute kidney injury) ([place])   Anticoagulated on Coumadin   Atrial fibrillation with slow ventricular response ([place])   DM (diabetes mellitus), type 2, uncontrolled, with renal complications ([place])   CKD stage 3 due to type 2 diabetes mellitus ([place])   Pulmonary hypertension (HCC)   Hypertensive heart disease   Morbid obesity (HCC)   OSA (obstructive sleep apnea)   Nonischemic cardiomyopathy ([place])   Acute exacerbation of CHF (congestive heart failure) ([place])  Brief Summary:- 69 year old with  PMHx significant forHTN, DM2, NICM/cardiomyopathy, CKD 3, atrial fibrillation on chronic anticoagulation, colon cancer,MGUS.On chronic 2 L O2 especially at night--- presenting with weight gain and worsening dyspnea at rest and dyspnea on exertion over the last couple of weeks despite increasing oral diuretics -Admitted for IV diuresis on 03/28/2019 after failing oral diuretics as outpatient   Echo-- 04/01/19- EF-- 55 to 60 %, significant Rt Sided HF   A/p  1)HFpEF/NICM--- acute on chronic preserved EF CHF exacerbation---  -clinical and echo data suggest Rt  sided Heart Failure and anasarca (patient with untreated OSA) --- daily weights, fluid input and output monitoring -Echo from December 2020 with EF of 50 to 55% --DOE and some orthopnea persist-- Weight documentation is inconsistent  --Weight and fluid input/output documentation remains inaccurate please see subjective above -c/n  IV Lasix to 60 mg every 12 hours -Dyspnea on exertion persist --Significant lower extremity edema/scrotal edema and anasarca persist oxygen requirement remains above baseline ---Cardiology consult from Dr. Johnsie Cancel appreciated recommends additional IV diuresis --At baseline was on 2 L of oxygen via nasal cannula -Cardiology service will consider transfer to [facility] for iv milrinone  2)Acute on chronic respiratory failure with hypoxia secondary to dCHF exacerbation--- PTA patient was on 2 L of oxygen via nasal cannula currently requiring about 3 L/min --- Treat CHF exacerbation as above #1 -Patient refusing CPAP nightly  3)AKI----acute kidney injury on CKD stage - IIIa-----  worsening renal function due to decreased renal perfusion in the setting of CHF flareup   creatinine on admission= 1.82 , baseline creatinine = 1.3   ,  --Creatinine is now 1.81 -- renally adjust medications, avoid nephrotoxic agents / dehydration  / hypotension -Hold losartan and Metformin -Monitor renal function closely with aggressive IV Lasix diuresis  4)Pulmonary Hypertension/morbid Obesity/OSA-----CPAP nightly, IV diuresis as above #1 -Patient not compliant with CPAP  5)Chronic atrial fibrillation--- continue Coumadin for stroke prophylaxis,  INR is 2.2,continuemetoprolol50 mg twice a day for rate control and Cardizem CD 180 mg for rate control  8)Diabetes--A1c 4.12 December 2018, --Stop Metformin due to kidney concerns -Hold Trulicity --- Continue Lantus and Use Novolog/Humalog Sliding scale insulin with Accu-Cheks/Fingersticks as ordered   Disposition/Need for in-Hospital  Stay- patient unable to be discharged at this time due to --- failed outpatient oral diuretics requiring IV diuretics- -Dyspnea on exertion persist --Significant lower extremity edema/scrotal edema and anasarca persist---  oxygen requirement remains above baseline -Creatinine above baseline ---Cardiology consult from Dr. Johnsie Cancel appreciated recommends additional IV diuresis -Cardiology service will consider transfer to [facility] for iv milrinone -Medically Not ready for discharge  Code Status : Full  Family Communication:   NA (patient is alert, awake and coherent)  Consults  : Cardiology  DVT Prophylaxis  : Coumadin- SCDs  Lab Results  Component Value Date   PLT 206 04/01/2019    Inpatient Medications  Scheduled Meds: . allopurinol  300 mg Oral Daily  . atorvastatin  20 mg Oral QPM  . benzonatate  100 mg Oral BID  . diltiazem  180 mg Oral Daily  . ferrous sulfate  325 mg Oral Q breakfast  . furosemide  60 mg Intravenous Q12H  . insulin aspart  0-15 Units Subcutaneous TID WC  . insulin aspart  0-5 Units Subcutaneous QHS  . insulin glargine  18 Units Subcutaneous QHS  . metoprolol tartrate  50 mg Oral BID  . pantoprazole  40 mg Oral Daily  . polyethylene glycol  17 g Oral Daily  . potassium chloride SA  20 mEq Oral Daily  . sodium chloride flush  3 mL Intravenous Q12H  . warfarin  5 mg Oral QPM  . Warfarin - Physician Dosing Inpatient   Does not apply q1800   Continuous Infusions: . sodium chloride     PRN Meds:.sodium chloride, acetaminophen **OR** acetaminophen, acetaminophen, guaiFENesin-dextromethorphan, ondansetron **OR** ondansetron (ZOFRAN) IV, perflutren lipid microspheres (DEFINITY) IV suspension, polyethylene glycol, polyvinyl alcohol, sodium chloride flush, traZODone   Anti-infectives (From admission, onward)   None       Objective:   Vitals:   03/31/19 1900 03/31/19 2125 04/01/19 0547 04/01/19 0547  BP:  128/74 128/70 128/70  Pulse: 75 75 73 70  Resp: 20  20 20 20   Temp:  98 F (36.7 C) (!) 97.5 F (36.4 C) (!) 97.5 F (36.4 C)  TempSrc:  Oral Oral Oral  SpO2:  95% (!) 88% (!) 87%  Weight:    130.3 kg  Height:        Wt Readings from Last 3 Encounters:  04/01/19 130.3 kg  03/08/19 110.2 kg  02/06/19 110.4 kg     Intake/Output Summary (Last 24 hours) at 04/01/2019 1459 Last data filed at 04/01/2019 0857 Gross per 24 hour  Intake 3 ml  Output --  Net 3 ml    Physical Exam  Gen:- Awake Alert, morbidly obese, dyspnea on exertion HEENT:- [place].AT, No sclera icterus Nose-  3L/min Neck-Supple Neck,No JVD,.  Lungs-diminished in bases with faint rales, no wheezing CV- S1, S2 normal, regular  Abd-  +ve B.Sounds, Abd Soft, No tenderness, increased truncal adiposity, anterior lower abdominal wall pitting edema/induration    Extremity/Skin:  2 to 3+ pitting edema/anasarca, pedal pulses present  Psych-affect is appropriate, oriented x3 Neuro-generalized weakness, no new focal deficits, no tremors GU--significant scrotal and penile edema, as well as edema of the mons pubis area   Data Review:   Micro Results Recent Results (  from the past 240 hour(s))  SARS CORONAVIRUS 2 (TAT 6-24 HRS) Nasopharyngeal Nasopharyngeal Swab     Status: None   Collection Time: 03/28/19  3:07 PM   Specimen: Nasopharyngeal Swab  Result Value Ref Range Status   SARS Coronavirus 2 NEGATIVE NEGATIVE Final    Comment: (NOTE) SARS-CoV-2 target nucleic acids are NOT DETECTED. The SARS-CoV-2 RNA is generally detectable in upper and lower respiratory specimens during the acute phase of infection. Negative results do not preclude SARS-CoV-2 infection, do not rule out co-infections with other pathogens, and should not be used as the sole basis for treatment or other patient management decisions. Negative results must be combined with clinical observations, patient history, and epidemiological information. The expected result is Negative. Fact Sheet for  Patients: SugarRoll.be Fact Sheet for Healthcare Providers: https://www.woods-mathews.com/ This test is not yet approved or cleared by the [place] FDA and  has been authorized for detection and/or diagnosis of SARS-CoV-2 by FDA under an Emergency Use Authorization (EUA). This EUA will remain  in effect (meaning this test can be used) for the duration of the COVID-19 declaration under Section 56 4(b)(1) of the Act, 21 U.S.C. section 360bbb-3(b)(1), unless the authorization is terminated or revoked sooner. Performed at [facility], [place] [address] [place], [place] [address]    Radiology Reports DG Chest 2 View  Result Date: 03/28/2019 CLINICAL DATA:  Shortness of breath EXAM: CHEST - 2 VIEW COMPARISON:  12/25/2018 FINDINGS: Cardiac shadow is enlarged but stable. Aortic calcifications are again seen. Central vascular congestion is noted with mild interstitial edema. No focal infiltrate is seen. Chronic pleural thickening on the left is noted. No acute bony abnormality is seen. IMPRESSION: Changes consistent with mild CHF. Chronic pleural thickening on the left is noted. Electronically Signed   By: Inez Catalina M.D.   On: 03/28/2019 09:46   ECHOCARDIOGRAM COMPLETE  Result Date: 04/01/2019    ECHOCARDIOGRAM REPORT   Patient Name:   William Flores Date of Exam: 04/01/2019 Medical Rec #:  [id number]      Height:       64.0 in Accession #:    [id number]     Weight:       287.2 lb Date of Birth:  1950-02-20      BSA:          2.282 m Patient Age:    35 years       BP:           128/70 mmHg Patient Gender: M              HR:           83 bpm. Exam Location:  [facility] Procedure: 2D Echo, Cardiac Doppler, Color Doppler and Intracardiac            Opacification Agent Indications:    CHF  History:        Patient has prior history of Echocardiogram examinations, most                 recent 12/28/2018. CHF and Cardiomyopathy, Morbid obesity,                  Arrythmias:Atrial Fibrillation, Signs/Symptoms:Dyspnea; Risk                 Factors:Hypertension and Diabetes. OSA, CKD.  Sonographer:    Dustin Flock RDCS Referring Phys: [place]  1. Patient has evidence for significant right sided heart failure.  2. Left ventricular ejection fraction, by estimation, is 55 to 60%. The left ventricle has normal function. The left ventricle has no regional wall motion abnormalities. Left ventricular diastolic parameters were normal.  3. Right ventricular systolic function is severely reduced. The right ventricular size is severely enlarged. There is severely elevated pulmonary artery systolic pressure.  4. Left atrial size was moderately dilated.  5. The mitral valve is normal in structure. Trivial mitral valve regurgitation. No evidence of mitral stenosis.  6. The aortic valve is normal in structure. Aortic valve regurgitation is not visualized. Mild to moderate aortic valve sclerosis/calcification is present, without any evidence of aortic stenosis.  7. The inferior vena cava is dilated in size with <50% respiratory variability, suggesting right atrial pressure of 15 mmHg. FINDINGS  Left Ventricle: Left ventricular ejection fraction, by estimation, is 55 to 60%. The left ventricle has normal function. The left ventricle has no regional wall motion abnormalities. Definity contrast agent was given IV to delineate the left ventricular  endocardial borders. The left ventricular internal cavity size was normal in size. There is no left ventricular hypertrophy. Left ventricular diastolic parameters were normal. Right Ventricle: The right ventricular size is severely enlarged. Right vetricular wall thickness was not assessed. Right ventricular systolic function is severely reduced. There is severely elevated pulmonary artery systolic pressure. The tricuspid regurgitant velocity is 4.13 m/s, and with an assumed right atrial pressure of 8 mmHg, the estimated  right ventricular systolic pressure is 24.2 mmHg. Left Atrium: Left atrial size was moderately dilated. Right Atrium: Right atrial size was normal in size. Pericardium: There is no evidence of pericardial effusion. Mitral Valve: The mitral valve is normal in structure. There is mild thickening of the mitral valve leaflet(s). There is mild calcification of the mitral valve leaflet(s). Normal mobility of the mitral valve leaflets. Moderate mitral annular calcification. Trivial mitral valve regurgitation. No evidence of mitral valve stenosis. Tricuspid Valve: The tricuspid valve is normal in structure. Tricuspid valve regurgitation is mild . No evidence of tricuspid stenosis. Aortic Valve: The aortic valve is normal in structure. Aortic valve regurgitation is not visualized. Mild to moderate aortic valve sclerosis/calcification is present, without any evidence of aortic stenosis. Pulmonic Valve: The pulmonic valve was normal in structure. Pulmonic valve regurgitation is mild. No evidence of pulmonic stenosis. Aorta: The aortic root is normal in size and structure. Venous: The inferior vena cava is dilated in size with less than 50% respiratory variability, suggesting right atrial pressure of 15 mmHg. IAS/Shunts: No atrial level shunt detected by color flow Doppler. Additional Comments: Patient has evidence for significant right sided heart failure.  LEFT VENTRICLE PLAX 2D LVIDd:         5.14 cm  Diastology LVIDs:         3.58 cm  LV e' lateral:   3.75 cm/s LV PW:         1.18 cm  LV E/e' lateral: 26.7 LV IVS:        1.16 cm  LV e' medial:    4.65 cm/s LVOT diam:     2.00 cm  LV E/e' medial:  21.5 LV SV:         66 LV SV Index:   29 LVOT Area:     3.14 cm  RIGHT VENTRICLE RV Basal diam:  3.17 cm TAPSE (M-mode): 2.2 cm LEFT ATRIUM             Index LA diam:        5.10  cm 2.24 cm/m LA Vol (A2C):   78.4 ml 34.36 ml/m LA Vol (A4C):   87.7 ml 38.44 ml/m LA Biplane Vol: 87.8 ml 38.48 ml/m  AORTIC VALVE LVOT Vmax:    101.00 cm/s LVOT Vmean:  72.500 cm/s LVOT VTI:    0.211 m  AORTA Ao Root diam: 2.80 cm MITRAL VALVE                TRICUSPID VALVE MV Area (PHT): 4.12 cm     TR Peak grad:   68.2 mmHg MV Decel Time: 184 msec     TR Vmax:        413.00 cm/s MV E velocity: 100.00 cm/s                             SHUNTS                             Systemic VTI:  0.21 m                             Systemic Diam: 2.00 cm Jenkins Rouge MD Electronically signed by Jenkins Rouge MD Signature Date/Time: 04/01/2019/2:27:05 PM    Final      CBC Recent Labs  Lab 03/28/19 0912 03/29/19 0446 04/01/19 0855  WBC 4.8 5.1 5.0  HGB 9.4* 9.3* 9.6*  HCT 35.4* 33.7* 36.1*  PLT 197 198 206  MCV 93.7 91.3 90.9  MCH 24.9* 25.2* 24.2*  MCHC 26.6* 27.6* 26.6*  RDW 20.2* 20.0* 19.9*  LYMPHSABS 0.6*  --   --   MONOABS 0.6  --   --   EOSABS 0.2  --   --   BASOSABS 0.1  --   --     Chemistries  Recent Labs  Lab 03/28/19 0912 03/29/19 0446 03/30/19 0552 03/31/19 0530 04/01/19 0855  NA 141 141 139 140 138  K 3.9 4.4 4.3 4.7 4.6  CL 103 104 100 103 100  CO2 27 29 29 29 30   GLUCOSE 137* 140* 208* 123* 124*  BUN 50* 45* 50* 56* 61*  CREATININE 1.82* 1.67* 1.96* 1.81* 1.93*  CALCIUM 8.7* 9.0 9.0 8.9 9.0  MG  --   --   --  2.2  --   AST 16  --   --   --   --   ALT 13  --   --   --   --   ALKPHOS 68  --   --   --   --   BILITOT 0.8  --   --   --   --    ------------------------------------------------------------------------------------------------------------------ No results for input(s): CHOL, HDL, LDLCALC, TRIG, CHOLHDL, LDLDIRECT in the last 72 hours.  Lab Results  Component Value Date   HGBA1C 6.9 (H) 03/28/2019   ------------------------------------------------------------------------------------------------------------------ No results for input(s): TSH, T4TOTAL, T3FREE, THYROIDAB in the last 72 hours.  Invalid input(s):  FREET3 ------------------------------------------------------------------------------------------------------------------ No results for input(s): VITAMINB12, FOLATE, FERRITIN, TIBC, IRON, RETICCTPCT in the last 72 hours.  Coagulation profile Recent Labs  Lab 03/28/19 0912 03/29/19 0446 03/30/19 0552 03/31/19 0530 04/01/19 0522  INR 2.9* 2.7* 2.2* 2.1* 2.0*    No results for input(s): DDIMER in the last 72 hours.  Cardiac Enzymes No results for input(s): CKMB, TROPONINI, MYOGLOBIN in the last 168 hours.  Invalid input(s): CK ------------------------------------------------------------------------------------------------------------------    Component Value  Date/Time   BNP 276.0 (H) 03/28/2019 2446   Roxan Hockey M.D on 04/01/2019 at 2:59 PM  Go to [email or web address] - for contact info  [facility] - Office  [phone]

## 2019-04-01 NOTE — Care Management Important Message (Signed)
Important Message  Patient Details  Name: BROGEN DUELL MRN: [id number] Date of Birth: [birth year]   Medicare Important Message Given:  Yes     [name] 04/01/2019, 11:13 AM

## 2019-04-01 NOTE — Consult Note (Addendum)
Cardiology Consultation:   Patient ID: [name] MRN: [id number]; DOB: 1950-02-09  Admit date: 03/28/2019 Date of Consult: 04/01/2019  Primary Care Provider: [name], [name], MD Primary Cardiologist: Hochrein  Primary Electrophysiologist:  None     Patient Profile:   William Flores is a 69 y.o. male with a hx  of chronic atrial fibrillation, diastolic CHF, and episodes of AKI   who is being seen today for the evaluation of CHF at the request of Dr. Denton Brick.  History of Present Illness:   William Flores is a 69 year old with history of nonischemic cardiomyopathy felt to be tachycardia mediated.  Left heart cath in 2008 no significant CAD EF 25%.  Follow-up echo in 2016 EF 35 to 40% and 05/2014 EF 60 to 65% with mild MR, mild RV dilatation and mild decreased RV systolic function.  Patient also has chronic atrial fibrillation on Coumadin, hypertension, morbid obesity, CKD OSA, episodes of AKI.  Last saw Dr. Percival Spanish in [place] office 2016 and saw Dr. Radford Pax for OSA in 2016.  Last 2D echo 12/28/2018 EF 50 to 55% with moderate LVH, normal left atrial size mild MR, severely elevated pulmonary artery systolic pressure, moderately reduced right ventricular systolic function  Patient was admitted with worsening dyspnea at rest and with exertion as well as weight gain.  He has been treated with IV Lasix 60 mg every 12, refuses CPAP, losartan and Metformin on hold because of AKI. Patient says he's on 2L O2 at home and waiting on new CPAP equipment. Admits to eating canned foods. Has swelling off/on but has anasarca and scrotal edema. Denies chest pain or palpitations. Lives with his daughter.         Past Medical History:  Diagnosis Date   CAD (coronary artery disease)    LHC 2/08:  mRCA 50%, o/w no sig CAD, EF 25%   Cardiomyopathy ([place])    EF 35-40% in 2/16 in setting of AF with RVR >> echo 5/16 with improved LVF with EF 65-70%   Chronic atrial fibrillation (HCC)    Chronic diastolic  heart failure (HCC)    HFPF - EF ~65-70% (OFTEN EXACERBATED BY AFIB)   CKD (chronic kidney disease)    hx of worsening renal failure and hyperK+ in setting of acute diast CHF >> required CVVHD in 4/16 and dialysis x 1 in 5/16   CKD stage 3 due to type 2 diabetes mellitus ([place]) 09/17/2015   Colon cancer ([place])    Colon polyps 09/21/2012   Tubular adenoma   Diabetes ([place])    History of echocardiogram    Echo 05/16/14:  mild LVH, vigorous LVF, EF 65-70%, no RWMA, mean AV gradient 9 mmHg, MAC, mild MR, mod LAE, mild RVE, mild reduced RVF, severe RAE   Hyperlipidemia    Hypertension    Obesity hypoventilation syndrome ([place])    Renal insufficiency    Sleep apnea     Past Surgical History:  Procedure Laterality Date   CIRCUMCISION     COLON RESECTION     COLONOSCOPY N/A 09/21/2012   Procedure: COLONOSCOPY;  Surgeon: Inda Castle, MD;  Location: [facility];  Service: Endoscopy;  Laterality: N/A;   COLONOSCOPY N/A 12/28/2018   Procedure: COLONOSCOPY;  Surgeon: Rogene Houston, MD;  Location: [facility];  Service: Endoscopy;  Laterality: N/A;   ESOPHAGOGASTRODUODENOSCOPY N/A 12/28/2018   Procedure: ESOPHAGOGASTRODUODENOSCOPY (EGD);  Surgeon: Rogene Houston, MD;  Location: [facility];  Service: Endoscopy;  Laterality: N/A;   US  ECHOCARDIOGRAPHY  03/04/2010   abnormal study     Home Medications:  Prior to Admission medications   Medication Sig Start Date End Date Taking? Authorizing Provider  acetaminophen (TYLENOL) 325 MG tablet Take 2 tablets (650 mg total) by mouth every 6 (six) hours as needed for mild pain (or Fever >/= 101). 01/01/19  Yes Emokpae, Courage, MD  allopurinol (ZYLOPRIM) 300 MG tablet TAKE 1 TABLET BY MOUTH EVERY DAY 01/28/19  Yes [name], [name], MD  atorvastatin (LIPITOR) 20 MG tablet 1 tablet daily 01/28/19  Yes [name], [name], MD  azelastine (OPTIVAR) 0.05 % ophthalmic solution PLACE 2 DROPS INTO BOTH EYES 2 (TWO) TIMES DAILY. Patient taking  differently: Place 2 drops into the right eye 2 (two) times daily as needed (dry eye from allergies).  07/30/18  Yes [name], [name], MD  benzonatate (TESSALON PERLES) 100 MG capsule Take 1 capsule (100 mg total) by mouth 2 (two) times daily. Patient taking differently: Take 100 mg by mouth 2 (two) times daily as needed.  03/08/19  Yes [name], [name], MD  DILT-XR 180 MG 24 hr capsule TAKE 1 CAPSULE BY MOUTH EVERY DAY Patient taking differently: Take 180 mg by mouth in the morning and at bedtime.  01/28/19  Yes [name], [name], MD  FARXIGA 5 MG TABS tablet Take 5 mg by mouth daily. 01/26/19  Yes [provider]  ferrous sulfate 325 (65 FE) MG tablet Take 325 mg by mouth daily with breakfast.    Yes [provider]  furosemide (LASIX) 80 MG tablet Take 1 tablet (80 mg total) by mouth daily. 01/28/19  Yes [name], [name], MD  insulin aspart (NOVOLOG FLEXPEN) 100 UNIT/ML FlexPen insulin aspart (novoLOG) injection 0-9 Units 0-9 Units, Subcutaneous, 3 times daily with meals, CBG < 70: Implement Hypoglycemia Standing Orders and refer to Hypoglycemia Standing Orders sidebar report CBG 70 - 120: 0 units CBG 121 - 150: 1 unit CBG 151 - 200: 2 units CBG 201 - 250: 3 units CBG 251 - 300: 5 units CBG 301 - 350: 7 units CBG 351 - 400: 9 units CBG > 400 Patient taking differently: Inject 9 Units into the skin in the morning and at bedtime. insulin aspart (novoLOG) injection 0-9 Units 0-9 Units, Subcutaneous, 3 times daily with meals, CBG < 70: Implement Hypoglycemia Standing Orders and refer to Hypoglycemia Standing Orders sidebar report CBG 70 - 120: 0 units CBG 121 - 150: 1 unit CBG 151 - 200: 2 units CBG 201 - 250: 3 units CBG 251 - 300: 5 units CBG 301 - 350: 7 units CBG 351 - 400: 9 units CBG > 400 02/06/19  Yes [name], [name], MD  insulin degludec (TRESIBA FLEXTOUCH) 100 UNIT/ML SOPN FlexTouch Pen Inject 0.18 mLs (18 Units total) into the skin at bedtime. 01/28/19  Yes [name],  [name], MD  losartan (COZAAR) 100 MG tablet Take 1 tablet (100 mg total) by mouth daily. 01/28/19  Yes [name], [name], MD  metFORMIN (GLUCOPHAGE) 500 MG tablet Take 1 tablet (500 mg total) by mouth 2 (two) times daily with a meal. 02/06/19  Yes [name], [name], MD  metoprolol tartrate (LOPRESSOR) 50 MG tablet Take 1 tablet (50 mg total) by mouth 2 (two) times daily. 01/28/19  Yes [name], [name], MD  pantoprazole (PROTONIX) 40 MG tablet Take 1 tablet (40 mg total) by mouth daily. Patient taking differently: Take 40 mg by mouth daily as needed.  01/28/19  Yes [name], [name],  MD  polyethylene glycol (MIRALAX / GLYCOLAX) 17 g packet Take 17 g by mouth daily. Patient taking differently: Take 17 g by mouth daily as needed.  01/01/19  Yes Emokpae, Courage, MD  potassium chloride SA (KLOR-CON M20) 20 MEQ tablet TAKE 1/2 TABLET (10 MEQ TOTAL) BY MOUTH DAILY. NEED OV. *INS WILL COVER 4/19 OR LATER* 01/28/19  Yes [name], [name], MD  TRULICITY 1.5 HQ/1.9XJ SOPN INJECT 1.5 MG INTO THE SKIN ONCE A WEEK. 01/28/19  Yes [name], [name], MD  warfarin (COUMADIN) 5 MG tablet Take 1 tablet (5 mg total) by mouth every evening. Patient taking differently: Take 5 mg by mouth every evening. 12/2018: directions were "take as directed". 01/2019: directions were "take 1 tab po qd". Patient has been taking 1 tablet every other day, alternating 1.5 tabs on the other days. 01/28/19  Yes [name], [name], MD  Insulin Pen Needle 32G X 4 MM MISC Use to give insulin daily Dx E11.29 Patient not taking: Reported on 03/30/2019 01/28/19   [name], [name], MD  polyvinyl alcohol (LIQUIFILM TEARS) 1.4 % ophthalmic solution Place 1 drop into both eyes as needed for dry eyes. Patient not taking: Reported on 03/30/2019 01/28/19   [name], [name], MD  diltiazem [facility]) 240 MG 24 hr capsule TAKE 1 CAPSULE DAILY 10/19/12 02/17/13  Vernie Shanks, MD    Inpatient Medications: Scheduled Meds:  allopurinol  300 mg  Oral Daily   atorvastatin  20 mg Oral QPM   benzonatate  100 mg Oral BID   diltiazem  180 mg Oral Daily   ferrous sulfate  325 mg Oral Q breakfast   furosemide  60 mg Intravenous Q12H   insulin aspart  0-15 Units Subcutaneous TID WC   insulin aspart  0-5 Units Subcutaneous QHS   insulin glargine  18 Units Subcutaneous QHS   metoprolol tartrate  50 mg Oral BID   pantoprazole  40 mg Oral Daily   polyethylene glycol  17 g Oral Daily   potassium chloride SA  20 mEq Oral Daily   sodium chloride flush  3 mL Intravenous Q12H   warfarin  5 mg Oral QPM   Warfarin - Physician Dosing Inpatient   Does not apply q1800   Continuous Infusions:  sodium chloride     PRN Meds: sodium chloride, acetaminophen **OR** acetaminophen, acetaminophen, guaiFENesin-dextromethorphan, ondansetron **OR** ondansetron (ZOFRAN) IV, polyethylene glycol, polyvinyl alcohol, sodium chloride flush, traZODone  Allergies:    Allergies  Allergen Reactions   Codeine     Headache     Social History:   Social History   Socioeconomic History   Marital status: Divorced    Spouse name: Not on file   Number of children: 1   Years of education: Not on file   Highest education level: Not on file  Occupational History    Employer: [employer]  Tobacco Use   Smoking status: Former Smoker    Quit date: 08/06/1983    Years since quitting: 35.6   Smokeless tobacco: Never Used  Substance and Sexual Activity   Alcohol use: No   Drug use: No   Sexual activity: Not on file  Other Topics Concern   Not on file  Social History Narrative   Not on file   Social Determinants of Health   Financial Resource Strain:    Difficulty of Paying Living Expenses:   Food Insecurity:    Worried About [place] in the Last Year:    Ran  Out of Food in the Last Year:   Transportation Needs:    Lack of Transportation (Medical):    Lack of Transportation (Non-Medical):   Physical Activity:    Days of Exercise per  Week:    Minutes of Exercise per Session:   Stress:    Feeling of Stress :   Social Connections:    Frequency of Communication with Friends and Family:    Frequency of Social Gatherings with Friends and Family:    Attends Religious Services:    Active Member of Clubs or Organizations:    Attends Music therapist:    Marital Status:   Intimate Partner Violence:    Fear of Current or Ex-Partner:    Emotionally Abused:    Physically Abused:    Sexually Abused:     Family History:    Family History  Problem Relation Age of Onset   Breast cancer Mother    Diabetes Mother    Heart attack Father      ROS:  Please see the history of present illness.  Review of Systems  Constitution: Negative.  HENT: Negative.   Cardiovascular: Positive for dyspnea on exertion and leg swelling.  Respiratory: Positive for shortness of breath.   Endocrine: Negative.   Hematologic/Lymphatic: Negative.   Musculoskeletal: Negative.   Gastrointestinal: Negative.   Genitourinary: Negative.   Neurological: Negative.    All other ROS reviewed and negative.     Physical Exam/Data:   Vitals:   03/31/19 1900 03/31/19 2125 04/01/19 0547 04/01/19 0547  BP:  128/74 128/70 128/70  Pulse: 75 75 73 70  Resp: _0 Temp:  98 F (36.7 C) (!) 97.5 F (36.4 C) (!) 97.5 F (36.4 C)  TempSrc:  Oral Oral Oral  SpO2:  95% (!) 88% (!) 87%  Weight:    130.3 kg  Height:       No intake or output data in the 24 hours ending 04/01/19 0803 Last 3 Weights 04/01/2019 03/31/2019 03/31/2019  Weight (lbs) 287 lb 3.2 oz 284 lb 14.4 oz 258 lb 9.6 oz  Weight (kg) 130.273 kg 129.23 kg 117.3 kg     Body mass index is 49.3 kg/m.  General:  Obese, in no acute distress HEENT: normal Lymph: no adenopathy Neck: no JVD Endocrine:  No thryomegaly Vascular: No carotid bruits; FA pulses 2+ bilaterally without bruits  Cardiac:  normal S1, S2; RRR; 1/6 systolic murmur LSB Lungs: decreased breath sounds  without rales Abd: distended-anasarca scrotal edema Ext: plus 2 edema Musculoskeletal:  No deformities, BUE and BLE strength normal and equal Skin: warm and dry  Neuro:  CNs 2-12 intact, no focal abnormalities noted Psych:  Normal affect   EKG:  The EKG was personally reviewed and demonstrates: Atrial fibrillation with controlled ventricular rate, poor R wave progression anteriorly, no change when compared to EKG in 2016 Telemetry:  Telemetry was personally reviewed and demonstrates:  Afib with CVR PVC's  Relevant CV Studies: 2D echo 12/28/2018 IMPRESSIONS     1. Left ventricular ejection fraction, by visual estimation, is 50 to  55%. The left ventricle has normal function. There is moderately increased  left ventricular hypertrophy.   2. Definity contrast agent was given IV to delineate the left ventricular  endocardial borders.   3. The left ventricle has no regional wall motion abnormalities.   4. Global right ventricle has moderately reduced systolic function.The  right ventricular size is severely enlarged. Right vetricular wall  thickness was  not assessed.   5. Left atrial size was normal.   6. Right atrial size was severely dilated.   7. Moderate mitral annular calcification.   8. The mitral valve is degenerative. Mild mitral valve regurgitation.   9. The tricuspid valve is grossly normal.  10. The aortic valve is tricuspid. Aortic valve regurgitation is not  visualized.  11. The pulmonic valve was not well visualized. Pulmonic valve  regurgitation is not visualized.  12. Severely elevated pulmonary artery systolic pressure.  13. The inferior vena cava is dilated in size with <50% respiratory  variability, suggesting right atrial pressure of 15 mmHg.   FINDINGS   Left Ventricle: Left ventricular ejection fraction, by visual estimation,  is 50 to 55%. The left ventricle has normal function. Definity contrast  agent was given IV to delineate the left ventricular  endocardial borders.  The left ventricle has no  regional wall motion abnormalities. There is moderately increased left  ventricular hypertrophy. Concentric left ventricular hypertrophy.   Right Ventricle: The right ventricular size is severely enlarged. Right  vetricular wall thickness was not assessed. Global RV systolic function is  has moderately reduced systolic function. The tricuspid regurgitant  velocity is 3.93 m/s, and with an  assumed right atrial pressure of 15 mmHg, the estimated right ventricular  systolic pressure is severely elevated at 76.7 mmHg.   Left Atrium: Left atrial size was normal in size.   Right Atrium: Right atrial size was severely dilated   Pericardium: There is no evidence of pericardial effusion.   Mitral Valve: The mitral valve is degenerative in appearance. There is  mild thickening of the mitral valve leaflet(s). Moderate mitral annular  calcification. Mild mitral valve regurgitation.   Tricuspid Valve: The tricuspid valve is grossly normal. Tricuspid valve  regurgitation moderate.   Aortic Valve: The aortic valve is tricuspid. . There is mild thickening of  the aortic valve. Aortic valve regurgitation is not visualized. Moderate  aortic valve annular calcification. There is mild thickening of the aortic  valve.   Pulmonic Valve: The pulmonic valve was not well visualized. Pulmonic valve  regurgitation is not visualized. Pulmonic regurgitation is not visualized.   Aorta: The aortic root is normal in size and structure.   Venous: The inferior vena cava is dilated in size with less than 50%  respiratory variability, suggesting right atrial pressure of 15 mmHg.   IAS/Shunts: No atrial level shunt detected by color flow Doppler.    Laboratory Data:  High Sensitivity Troponin:  No results for input(s): TROPONINIHS in the last 720 hours.   Chemistry Recent Labs  Lab 03/29/19 0446 03/30/19 0552 03/31/19 0530  NA 141 139 140  K 4.4 4.3 4.7   CL 104 100 103  CO2 _0 GLUCOSE 140* 208* 123*  BUN 45* 50* 56*  CREATININE 1.67* 1.96* 1.81*  CALCIUM 9.0 9.0 8.9  GFRNONAA 42* 34* 38*  GFRAA 48* 40* 44*  ANIONGAP _1 Recent Labs  Lab 03/28/19 0912  PROT 6.4*  ALBUMIN 3.2*  AST 16  ALT 13  ALKPHOS 68  BILITOT 0.8   Hematology Recent Labs  Lab 03/28/19 0912 03/29/19 0446  WBC 4.8 5.1  RBC 3.78* 3.69*  HGB 9.4* 9.3*  HCT 35.4* 33.7*  MCV 93.7 91.3  MCH 24.9* 25.2*  MCHC 26.6* 27.6*  RDW 20.2* 20.0*  PLT 197 198   BNP Recent Labs  Lab 03/28/19 0912  BNP 276.0*    DDimer No  results for input(s): DDIMER in the last 168 hours.   Radiology/Studies:  DG Chest 2 View  Result Date: 03/28/2019 CLINICAL DATA:  Shortness of breath EXAM: CHEST - 2 VIEW COMPARISON:  12/25/2018 FINDINGS: Cardiac shadow is enlarged but stable. Aortic calcifications are again seen. Central vascular congestion is noted with mild interstitial edema. No focal infiltrate is seen. Chronic pleural thickening on the left is noted. No acute bony abnormality is seen. IMPRESSION: Changes consistent with mild CHF. Chronic pleural thickening on the left is noted. Electronically Signed   By: Inez Catalina M.D.   On: 03/28/2019 09:46        Assessment and Plan:   Acute on chronic diastolic CHF LVEF 50 to 66% with reduced right ventricular systolic function on echo 12/2018 on Lasix IV 60 mg every 12 INO's -2 L since admission. Needs continued diureses. He says baseline weight is 180 but weight yest 184. He has more than 4 lbs to diurese. Update echo with new CHF History of nonischemic cardiomyopathy felt to be tachycardia mediated but resolved.  Left heart cath in 2008 no significant CAD Chronic atrial fibrillation on Coumadin rate controlled INR 2.0 CKD stage III with AKI losartan and Metformin on hold creatinine 1.81 yesterday OSA refuses CPAP here-getting new equipment at home. Essential hypertension controlled on metoprolol-lisinopril on  hold Diabetes mellitus managed by primary care team      For questions or updates, please contact [facility] Please consult [email or web address] for contact info under     Signed, Ermalinda Barrios, PA-C  04/01/2019 8:03 AM  Patient examined chart reviewed Care discussed with patient and PA Exam with obese chronically ill male Basilar atelectasis. Severe right sided heart failure with JVP elevated. Large umbilical hernia. Scrotal edema Plus 3 LE edema with stasis changes. Needs to be compliant with CPAP continue diuresis If it slows down or becomes azotemic will need transfer to [facility] for iv milrinone and CHF consult for RV failure Afib is rate controlled and not an issues Agree with holding ARB for renal function   Jenkins Rouge MD [facility]

## 2019-04-01 NOTE — Plan of Care (Signed)
  Problem: Education: Goal: Knowledge of General Education information will improve Description: Including pain rating scale, medication(s)/side effects and non-pharmacologic comfort measures Outcome: Progressing   Problem: Clinical Measurements: Goal: Ability to maintain clinical measurements within normal limits will improve Outcome: Progressing   Problem: Activity: Goal: Risk for activity intolerance will decrease Outcome: Progressing   Problem: Safety: Goal: Ability to remain free from injury will improve Outcome: Progressing   Problem: Skin Integrity: Goal: Risk for impaired skin integrity will decrease Outcome: Progressing

## 2019-04-02 DIAGNOSIS — Z7901 Long term (current) use of anticoagulants: Secondary | ICD-10-CM

## 2019-04-02 DIAGNOSIS — I50813 Acute on chronic right heart failure: Secondary | ICD-10-CM

## 2019-04-02 DIAGNOSIS — N179 Acute kidney failure, unspecified: Secondary | ICD-10-CM

## 2019-04-02 DIAGNOSIS — I4821 Permanent atrial fibrillation: Secondary | ICD-10-CM

## 2019-04-02 DIAGNOSIS — R601 Generalized edema: Secondary | ICD-10-CM

## 2019-04-02 DIAGNOSIS — N189 Chronic kidney disease, unspecified: Secondary | ICD-10-CM

## 2019-04-02 DIAGNOSIS — I428 Other cardiomyopathies: Secondary | ICD-10-CM

## 2019-04-02 LAB — PROTIME-INR
INR: 1.8 — ABNORMAL HIGH (ref 0.8–1.2)
Prothrombin Time: 20.5 seconds — ABNORMAL HIGH (ref 11.4–15.2)

## 2019-04-02 LAB — RENAL FUNCTION PANEL
Albumin: 3.4 g/dL — ABNORMAL LOW (ref 3.5–5.0)
Anion gap: 10 (ref 5–15)
BUN: 65 mg/dL — ABNORMAL HIGH (ref 8–23)
CO2: 25 mmol/L (ref 22–32)
Calcium: 8.6 mg/dL — ABNORMAL LOW (ref 8.9–10.3)
Chloride: 101 mmol/L (ref 98–111)
Creatinine, Ser: 2.04 mg/dL — ABNORMAL HIGH (ref 0.61–1.24)
GFR calc Af Amer: 38 mL/min — ABNORMAL LOW (ref >60)
GFR calc non Af Amer: 33 mL/min — ABNORMAL LOW (ref >60)
Glucose, Bld: 165 mg/dL — ABNORMAL HIGH (ref 70–99)
Phosphorus: 5.2 mg/dL — ABNORMAL HIGH (ref 2.5–4.6)
Potassium: 4.9 mmol/L (ref 3.5–5.1)
Sodium: 136 mmol/L (ref 135–145)

## 2019-04-02 LAB — GLUCOSE, CAPILLARY
Glucose-Capillary: 128 mg/dL — ABNORMAL HIGH (ref 70–99)
Glucose-Capillary: 137 mg/dL — ABNORMAL HIGH (ref 70–99)
Glucose-Capillary: 144 mg/dL — ABNORMAL HIGH (ref 70–99)
Glucose-Capillary: 164 mg/dL — ABNORMAL HIGH (ref 70–99)

## 2019-04-02 MED ORDER — WARFARIN SODIUM 7.5 MG PO TABS
7.5000 mg | ORAL_TABLET | Freq: Once | ORAL | Status: AC
  Administered 2019-04-02: 7.5 mg via ORAL
  Filled 2019-04-02: qty 1

## 2019-04-02 MED ORDER — FUROSEMIDE 10 MG/ML IJ SOLN
4.0000 mg/h | INTRAVENOUS | Status: DC
  Administered 2019-04-02: 8 mg/h via INTRAVENOUS
  Filled 2019-04-02 (×4): qty 25

## 2019-04-02 MED ORDER — METOLAZONE 5 MG PO TABS
2.5000 mg | ORAL_TABLET | Freq: Once | ORAL | Status: AC
  Administered 2019-04-02: 2.5 mg via ORAL
  Filled 2019-04-02: qty 1

## 2019-04-02 NOTE — Progress Notes (Signed)
Patient Demographics:    William Flores, is a 69 y.o. male, DOB - 04-09-1950, NOT:[id number]  Admit date - 03/28/2019   Admitting Physician Vanice Rappa Denton Brick, MD  Outpatient Primary MD for the patient is [name], [name], MD  LOS - 4   Chief Complaint  Patient presents with  . Abdominal Pain        Subjective:    William Flores today has no fevers, no emesis,  No chest pain,    -Remains short of breath, weight largely unchanged -Voiding okay--patient admits at times he voids into the toilet which makes it difficult to measure is true urine output  --Cardiology consult from Dr. Bronson Ing appreciated recommends switching to IV Lasix drip with addition of p.o. metolazone due to persistent volume overload  Assessment  & Plan :    Principal Problem:   Acute on chronic heart failure with preserved ejection fraction (HFpEF) (HCC) Active Problems:   AKI (acute kidney injury) ([place])   Anticoagulated on Coumadin   Atrial fibrillation with slow ventricular response (HCC)   DM (diabetes mellitus), type 2, uncontrolled, with renal complications ([place])   CKD stage 3 due to type 2 diabetes mellitus ([place])   Pulmonary hypertension (HCC)   Hypertensive heart disease   Morbid obesity (HCC)   OSA (obstructive sleep apnea)   Nonischemic cardiomyopathy ([place])   Acute exacerbation of CHF (congestive heart failure) ([place])  Brief Summary:- 69 year old with  PMHx significant forHTN, DM2, NICM/cardiomyopathy, CKD 3, atrial fibrillation on chronic anticoagulation, colon cancer,MGUS.On chronic 2 L O2 especially at night--- presenting with weight gain and worsening dyspnea at rest and dyspnea on exertion over the last couple of weeks despite increasing oral diuretics -Admitted for IV diuresis on 03/28/2019 after failing oral diuretics as outpatient ---Cardiology consult from Dr. Bronson Ing on 04/02/19 appreciated recommends  switching to IV Lasix drip with addition of p.o. metolazone due to persistent volume overload/anasarca  Echo-- 04/01/19- EF-- 24 to 60 %, significant Rt Sided HF severe pulmonary HTN  A/p  1)HFpEF/NICM--- acute on chronic preserved EF CHF exacerbation---  -clinical and echo data suggest Rt sided Heart Failure with severe pulmonary HTN and anasarca (patient with morbid obesity and untreated OSA) --- Continue daily weights, fluid input and output monitoring -Echo this admission as above --DOE and some orthopnea persist-- Weight documentation is inconsistent  --Weight and fluid input/output documentation remains inaccurate  -Initially treated with IV Lasix 80 mg every 12 hours, changed to IV Lasix 60 mg every 12 hours -Started on IV Lasix drip with p.o. metolazone on 04/02/2018 -Dyspnea on exertion persist --Significant lower extremity edema/scrotal edema and anasarca persist --At baseline was on 2 L of oxygen via nasal cannula -Cardiology service will consider transfer to [facility] for iv milrinone and heart failure service consult if diuresis remains challenging  2)Acute on chronic respiratory failure with hypoxia secondary to dCHF exacerbation--- PTA patient was on 2 L of oxygen via nasal cannula currently requiring about 3 L/min --- Treat CHF exacerbation as above #1 -Patient refusing CPAP nightly  3)AKI----acute kidney injury on CKD stage - IIIa-----  worsening renal function due to decreased renal perfusion in the setting of CHF flareup   creatinine on admission= 1.82 , baseline creatinine = 1.3   ,  --Creatinine  is now 2.0 -- renally adjust medications, avoid nephrotoxic agents / dehydration  / hypotension -Hold losartan and Metformin -Monitor renal function closely with aggressive IV Lasix diuresis --- May have to tolerate slight bump in creatinine in order to adequately diurese patient -Hold off on nephrology consult for now  4)Severe pulmonary Hypertension/morbid Obesity/OSA----  IV diuresis as above #1--- -Patient not compliant with CPAP -Please see echo report above  5)Chronic atrial fibrillation--- continue Coumadin for stroke prophylaxis, INR is 1.8,continuemetoprolol50 mg twice a day for rate control and Cardizem CD 180 mg for rate control  8)Diabetes--A1c 4.12 December 2018, --Stop Metformin due to kidney concerns -Hold Trulicity --- Continue Lantus and Use Novolog/Humalog Sliding scale insulin with Accu-Cheks/Fingersticks as ordered   Disposition/Need for in-Hospital Stay- patient unable to be discharged at this time due to --- failed outpatient oral diuretics requiring IV diuretics- -Dyspnea on exertion persist --Significant lower extremity edema/scrotal edema and anasarca persist---  oxygen requirement remains above baseline -Creatinine above baseline --Cardiology consult from Dr. Bronson Ing appreciated recommends switching to IV Lasix drip with addition of p.o. metolazone due to persistent volume overload -Cardiology service will consider transfer to [facility] for iv milrinone and heart failure cardiology consult -Medically Not ready for discharge  Code Status : Full  Family Communication:   NA (patient is alert, awake and coherent)  Consults  : Cardiology  DVT Prophylaxis  : Coumadin- SCDs  Lab Results  Component Value Date   PLT 206 04/01/2019    Inpatient Medications  Scheduled Meds: . allopurinol  300 mg Oral Daily  . atorvastatin  20 mg Oral QPM  . benzonatate  100 mg Oral BID  . diltiazem  180 mg Oral Daily  . ferrous sulfate  325 mg Oral Q breakfast  . insulin aspart  0-15 Units Subcutaneous TID WC  . insulin aspart  0-5 Units Subcutaneous QHS  . insulin glargine  18 Units Subcutaneous QHS  . metolazone  2.5 mg Oral Once  . metoprolol tartrate  50 mg Oral BID  . pantoprazole  40 mg Oral Daily  . polyethylene glycol  17 g Oral Daily  . potassium chloride SA  20 mEq Oral Daily  . sodium chloride flush  3 mL Intravenous Q12H  .  warfarin  7.5 mg Oral ONCE-1600   Continuous Infusions: . sodium chloride    . furosemide (LASIX) infusion     PRN Meds:.sodium chloride, acetaminophen **OR** acetaminophen, acetaminophen, guaiFENesin-dextromethorphan, ondansetron **OR** ondansetron (ZOFRAN) IV, polyethylene glycol, polyvinyl alcohol, sodium chloride flush, traZODone   Anti-infectives (From admission, onward)   None       Objective:   Vitals:   04/01/19 0547 04/01/19 1415 04/01/19 2108 04/02/19 0554  BP: 128/70 130/75 98/87 113/77  Pulse: 70 81 76 69  Resp: 20 20 20 18   Temp: (!) 97.5 F (36.4 C) 97.6 F (36.4 C) 98.2 F (36.8 C) (!) 97.3 F (36.3 C)  TempSrc: Oral  Oral Oral  SpO2: (!) 87% (!) 89% 92% 95%  Weight: 130.3 kg   129.7 kg  Height:        Wt Readings from Last 3 Encounters:  04/02/19 129.7 kg  03/08/19 110.2 kg  02/06/19 110.4 kg     Intake/Output Summary (Last 24 hours) at 04/02/2019 1121 Last data filed at 04/01/2019 1700 Gross per 24 hour  Intake 720 ml  Output 900 ml  Net -180 ml    Physical Exam  Gen:- Awake Alert, morbidly obese, dyspnea on exertion HEENT:- [place].AT, No sclera icterus  Nose- [place] 3L/min Neck-Supple Neck,   Lungs-diminished in bases , no wheezing CV- S1, S2 normal, regular  Abd-  +ve B.Sounds, Abd Soft, No tenderness, increased truncal adiposity, anterior lower abdominal wall pitting edema/induration    Extremity/Skin:  2 to 3+ pitting edema/anasarca, pedal pulses present  Psych-affect is appropriate, oriented x3 Neuro-generalized weakness, no new focal deficits, no tremors GU--significant scrotal and penile edema, as well as edema of the mons pubis area   Data Review:   Micro Results Recent Results (from the past 240 hour(s))  SARS CORONAVIRUS 2 (TAT 6-24 HRS) Nasopharyngeal Nasopharyngeal Swab     Status: None   Collection Time: 03/28/19  3:07 PM   Specimen: Nasopharyngeal Swab  Result Value Ref Range Status   SARS Coronavirus 2 NEGATIVE NEGATIVE Final     Comment: (NOTE) SARS-CoV-2 target nucleic acids are NOT DETECTED. The SARS-CoV-2 RNA is generally detectable in upper and lower respiratory specimens during the acute phase of infection. Negative results do not preclude SARS-CoV-2 infection, do not rule out co-infections with other pathogens, and should not be used as the sole basis for treatment or other patient management decisions. Negative results must be combined with clinical observations, patient history, and epidemiological information. The expected result is Negative. Fact Sheet for Patients: SugarRoll.be Fact Sheet for Healthcare Providers: https://www.woods-mathews.com/ This test is not yet approved or cleared by the [place] FDA and  has been authorized for detection and/or diagnosis of SARS-CoV-2 by FDA under an Emergency Use Authorization (EUA). This EUA will remain  in effect (meaning this test can be used) for the duration of the COVID-19 declaration under Section 56 4(b)(1) of the Act, 21 U.S.C. section 360bbb-3(b)(1), unless the authorization is terminated or revoked sooner. Performed at [facility], [place] [address], [place], [place] [address]    Radiology Reports DG Chest 2 View  Result Date: 03/28/2019 CLINICAL DATA:  Shortness of breath EXAM: CHEST - 2 VIEW COMPARISON:  12/25/2018 FINDINGS: Cardiac shadow is enlarged but stable. Aortic calcifications are again seen. Central vascular congestion is noted with mild interstitial edema. No focal infiltrate is seen. Chronic pleural thickening on the left is noted. No acute bony abnormality is seen. IMPRESSION: Changes consistent with mild CHF. Chronic pleural thickening on the left is noted. Electronically Signed   By: Inez Catalina M.D.   On: 03/28/2019 09:46   ECHOCARDIOGRAM COMPLETE  Result Date: 04/01/2019    ECHOCARDIOGRAM REPORT   Patient Name:   ELENO WEIMAR Date of Exam: 04/01/2019 Medical Rec #:  [id number]       Height:       64.0 in Accession #:    [id number]     Weight:       287.2 lb Date of Birth:  December 08, 1950      BSA:          2.282 m Patient Age:    8 years       BP:           128/70 mmHg Patient Gender: M              HR:           83 bpm. Exam Location:  [facility] Procedure: 2D Echo, Cardiac Doppler, Color Doppler and Intracardiac            Opacification Agent Indications:    CHF  History:        Patient has prior history of Echocardiogram examinations, most  recent 12/28/2018. CHF and Cardiomyopathy, Morbid obesity,                 Arrythmias:Atrial Fibrillation, Signs/Symptoms:Dyspnea; Risk                 Factors:Hypertension and Diabetes. OSA, CKD.  Sonographer:    Dustin Flock RDCS Referring Phys: [place]  1. Patient has evidence for significant right sided heart failure.  2. Left ventricular ejection fraction, by estimation, is 55 to 60%. The left ventricle has normal function. The left ventricle has no regional wall motion abnormalities. Left ventricular diastolic parameters were normal.  3. Right ventricular systolic function is severely reduced. The right ventricular size is severely enlarged. There is severely elevated pulmonary artery systolic pressure.  4. Left atrial size was moderately dilated.  5. The mitral valve is normal in structure. Trivial mitral valve regurgitation. No evidence of mitral stenosis.  6. The aortic valve is normal in structure. Aortic valve regurgitation is not visualized. Mild to moderate aortic valve sclerosis/calcification is present, without any evidence of aortic stenosis.  7. The inferior vena cava is dilated in size with <50% respiratory variability, suggesting right atrial pressure of 15 mmHg. FINDINGS  Left Ventricle: Left ventricular ejection fraction, by estimation, is 55 to 60%. The left ventricle has normal function. The left ventricle has no regional wall motion abnormalities. Definity contrast agent was given IV to  delineate the left ventricular  endocardial borders. The left ventricular internal cavity size was normal in size. There is no left ventricular hypertrophy. Left ventricular diastolic parameters were normal. Right Ventricle: The right ventricular size is severely enlarged. Right vetricular wall thickness was not assessed. Right ventricular systolic function is severely reduced. There is severely elevated pulmonary artery systolic pressure. The tricuspid regurgitant velocity is 4.13 m/s, and with an assumed right atrial pressure of 8 mmHg, the estimated right ventricular systolic pressure is 44.0 mmHg. Left Atrium: Left atrial size was moderately dilated. Right Atrium: Right atrial size was normal in size. Pericardium: There is no evidence of pericardial effusion. Mitral Valve: The mitral valve is normal in structure. There is mild thickening of the mitral valve leaflet(s). There is mild calcification of the mitral valve leaflet(s). Normal mobility of the mitral valve leaflets. Moderate mitral annular calcification. Trivial mitral valve regurgitation. No evidence of mitral valve stenosis. Tricuspid Valve: The tricuspid valve is normal in structure. Tricuspid valve regurgitation is mild . No evidence of tricuspid stenosis. Aortic Valve: The aortic valve is normal in structure. Aortic valve regurgitation is not visualized. Mild to moderate aortic valve sclerosis/calcification is present, without any evidence of aortic stenosis. Pulmonic Valve: The pulmonic valve was normal in structure. Pulmonic valve regurgitation is mild. No evidence of pulmonic stenosis. Aorta: The aortic root is normal in size and structure. Venous: The inferior vena cava is dilated in size with less than 50% respiratory variability, suggesting right atrial pressure of 15 mmHg. IAS/Shunts: No atrial level shunt detected by color flow Doppler. Additional Comments: Patient has evidence for significant right sided heart failure.  LEFT VENTRICLE PLAX  2D LVIDd:         5.14 cm  Diastology LVIDs:         3.58 cm  LV e' lateral:   3.75 cm/s LV PW:         1.18 cm  LV E/e' lateral: 26.7 LV IVS:        1.16 cm  LV e' medial:    4.65 cm/s LVOT  diam:     2.00 cm  LV E/e' medial:  21.5 LV SV:         66 LV SV Index:   29 LVOT Area:     3.14 cm  RIGHT VENTRICLE RV Basal diam:  3.17 cm TAPSE (M-mode): 2.2 cm LEFT ATRIUM             Index LA diam:        5.10 cm 2.24 cm/m LA Vol (A2C):   78.4 ml 34.36 ml/m LA Vol (A4C):   87.7 ml 38.44 ml/m LA Biplane Vol: 87.8 ml 38.48 ml/m  AORTIC VALVE LVOT Vmax:   101.00 cm/s LVOT Vmean:  72.500 cm/s LVOT VTI:    0.211 m  AORTA Ao Root diam: 2.80 cm MITRAL VALVE                TRICUSPID VALVE MV Area (PHT): 4.12 cm     TR Peak grad:   68.2 mmHg MV Decel Time: 184 msec     TR Vmax:        413.00 cm/s MV E velocity: 100.00 cm/s                             SHUNTS                             Systemic VTI:  0.21 m                             Systemic Diam: 2.00 cm Jenkins Rouge MD Electronically signed by Jenkins Rouge MD Signature Date/Time: 04/01/2019/2:27:05 PM    Final      CBC Recent Labs  Lab 03/28/19 0912 03/29/19 0446 04/01/19 0855  WBC 4.8 5.1 5.0  HGB 9.4* 9.3* 9.6*  HCT 35.4* 33.7* 36.1*  PLT 197 198 206  MCV 93.7 91.3 90.9  MCH 24.9* 25.2* 24.2*  MCHC 26.6* 27.6* 26.6*  RDW 20.2* 20.0* 19.9*  LYMPHSABS 0.6*  --   --   MONOABS 0.6  --   --   EOSABS 0.2  --   --   BASOSABS 0.1  --   --     Chemistries  Recent Labs  Lab 03/28/19 0912 03/28/19 0912 03/29/19 0446 03/30/19 0552 03/31/19 0530 04/01/19 0855 04/02/19 0800  NA 141   < > 141 139 140 138 136  K 3.9   < > 4.4 4.3 4.7 4.6 4.9  CL 103   < > 104 100 103 100 101  CO2 27   < > 29 29 29 30 25   GLUCOSE 137*   < > 140* 208* 123* 124* 165*  BUN 50*   < > 45* 50* 56* 61* 65*  CREATININE 1.82*   < > 1.67* 1.96* 1.81* 1.93* 2.04*  CALCIUM 8.7*   < > 9.0 9.0 8.9 9.0 8.6*  MG  --   --   --   --  2.2  --   --   AST 16  --   --   --   --   --    --   ALT 13  --   --   --   --   --   --   ALKPHOS 68  --   --   --   --   --   --  BILITOT 0.8  --   --   --   --   --   --    < > = values in this interval not displayed.   ------------------------------------------------------------------------------------------------------------------ No results for input(s): CHOL, HDL, LDLCALC, TRIG, CHOLHDL, LDLDIRECT in the last 72 hours.  Lab Results  Component Value Date   HGBA1C 6.9 (H) 03/28/2019   ------------------------------------------------------------------------------------------------------------------ No results for input(s): TSH, T4TOTAL, T3FREE, THYROIDAB in the last 72 hours.  Invalid input(s): FREET3 ------------------------------------------------------------------------------------------------------------------ No results for input(s): VITAMINB12, FOLATE, FERRITIN, TIBC, IRON, RETICCTPCT in the last 72 hours.  Coagulation profile Recent Labs  Lab 03/29/19 0446 03/30/19 0552 03/31/19 0530 04/01/19 0522 04/02/19 0439  INR 2.7* 2.2* 2.1* 2.0* 1.8*    No results for input(s): DDIMER in the last 72 hours.  Cardiac Enzymes No results for input(s): CKMB, TROPONINI, MYOGLOBIN in the last 168 hours.  Invalid input(s): CK ------------------------------------------------------------------------------------------------------------------    Component Value Date/Time   BNP 276.0 (H) 03/28/2019 2761   Roxan Hockey M.D on 04/02/2019 at 11:21 AM  Go to [email or web address] - for contact info  [facility] - Office  [phone]

## 2019-04-02 NOTE — Progress Notes (Signed)
Progress Note  Patient Name: William Flores Date of Encounter: 04/02/2019  Primary Cardiologist: Minus Breeding, MD   Subjective   He feels his scrotal edema, leg swelling, and abdominal distention are about the same over the last few days.  He said that while his legs are chronically swollen, they are much more swollen and compared to his baseline.  Inpatient Medications    Scheduled Meds: . allopurinol  300 mg Oral Daily  . atorvastatin  20 mg Oral QPM  . benzonatate  100 mg Oral BID  . diltiazem  180 mg Oral Daily  . ferrous sulfate  325 mg Oral Q breakfast  . furosemide  60 mg Intravenous Q12H  . insulin aspart  0-15 Units Subcutaneous TID WC  . insulin aspart  0-5 Units Subcutaneous QHS  . insulin glargine  18 Units Subcutaneous QHS  . metoprolol tartrate  50 mg Oral BID  . pantoprazole  40 mg Oral Daily  . polyethylene glycol  17 g Oral Daily  . potassium chloride SA  20 mEq Oral Daily  . sodium chloride flush  3 mL Intravenous Q12H  . warfarin  7.5 mg Oral ONCE-1600   Continuous Infusions: . sodium chloride     PRN Meds: sodium chloride, acetaminophen **OR** acetaminophen, acetaminophen, guaiFENesin-dextromethorphan, ondansetron **OR** ondansetron (ZOFRAN) IV, polyethylene glycol, polyvinyl alcohol, sodium chloride flush, traZODone   Vital Signs    Vitals:   04/01/19 0547 04/01/19 1415 04/01/19 2108 04/02/19 0554  BP: 128/70 130/75 98/87 113/77  Pulse: 70 81 76 69  Resp: 20 20 20 18   Temp: (!) 97.5 F (36.4 C) 97.6 F (36.4 C) 98.2 F (36.8 C) (!) 97.3 F (36.3 C)  TempSrc: Oral  Oral Oral  SpO2: (!) 87% (!) 89% 92% 95%  Weight: 130.3 kg   129.7 kg  Height:        Intake/Output Summary (Last 24 hours) at 04/02/2019 1101 Last data filed at 04/01/2019 1700 Gross per 24 hour  Intake 720 ml  Output 900 ml  Net -180 ml   Last 3 Weights 04/02/2019 04/01/2019 03/31/2019  Weight (lbs) 286 lb 287 lb 3.2 oz 284 lb 14.4 oz  Weight (kg) 129.729 kg 130.273 kg  129.23 kg        Physical Exam   GEN: No acute distress.   Neck: No JVD Cardiac:  Irregular rhythm, regular rate, normal S1/S2, no S3, soft systolic murmur along left sternal border Respiratory:  Diminished breath sounds throughout with no obvious wheeze or crackles. GI:  Firm, distended.  Scrotal edema also noted. MS:  2+ pitting bilateral lower extremity edema; No deformity. Neuro:  Nonfocal  Psych: Normal affect   Labs    High Sensitivity Troponin:  No results for input(s): TROPONINIHS in the last 720 hours.    Chemistry Recent Labs  Lab 03/28/19 0912 03/29/19 0446 03/31/19 0530 04/01/19 0855 04/02/19 0800  NA 141   < > 140 138 136  K 3.9   < > 4.7 4.6 4.9  CL 103   < > 103 100 101  CO2 27   < > 29 30 25   GLUCOSE 137*   < > 123* 124* 165*  BUN 50*   < > 56* 61* 65*  CREATININE 1.82*   < > 1.81* 1.93* 2.04*  CALCIUM 8.7*   < > 8.9 9.0 8.6*  PROT 6.4*  --   --   --   --   ALBUMIN 3.2*  --   --   --  3.4*  AST 16  --   --   --   --   ALT 13  --   --   --   --   ALKPHOS 68  --   --   --   --   BILITOT 0.8  --   --   --   --   GFRNONAA 38*   < > 38* 35* 33*  GFRAA 44*   < > 44* 41* 38*  ANIONGAP 11   < > 8 8 10    < > = values in this interval not displayed.     Hematology Recent Labs  Lab 03/28/19 0912 03/29/19 0446 04/01/19 0855  WBC 4.8 5.1 5.0  RBC 3.78* 3.69* 3.97*  HGB 9.4* 9.3* 9.6*  HCT 35.4* 33.7* 36.1*  MCV 93.7 91.3 90.9  MCH 24.9* 25.2* 24.2*  MCHC 26.6* 27.6* 26.6*  RDW 20.2* 20.0* 19.9*  PLT 197 198 206    BNP Recent Labs  Lab 03/28/19 0912  BNP 276.0*     DDimer No results for input(s): DDIMER in the last 168 hours.   Radiology    ECHOCARDIOGRAM COMPLETE  Result Date: 04/01/2019    ECHOCARDIOGRAM REPORT   Patient Name:   OGDEN HANDLIN Date of Exam: 04/01/2019 Medical Rec #:  [id number]      Height:       64.0 in Accession #:    [id number]     Weight:       287.2 lb Date of Birth:  [birth year]      BSA:          2.282 m Patient  Age:    42 years       BP:           128/70 mmHg Patient Gender: M              HR:           83 bpm. Exam Location:  [facility] Procedure: 2D Echo, Cardiac Doppler, Color Doppler and Intracardiac            Opacification Agent Indications:    CHF  History:        Patient has prior history of Echocardiogram examinations, most                 recent 12/28/2018. CHF and Cardiomyopathy, Morbid obesity,                 Arrythmias:Atrial Fibrillation, Signs/Symptoms:Dyspnea; Risk                 Factors:Hypertension and Diabetes. OSA, CKD.  Sonographer:    Dustin Flock RDCS Referring Phys: [place]  1. Patient has evidence for significant right sided heart failure.  2. Left ventricular ejection fraction, by estimation, is 55 to 60%. The left ventricle has normal function. The left ventricle has no regional wall motion abnormalities. Left ventricular diastolic parameters were normal.  3. Right ventricular systolic function is severely reduced. The right ventricular size is severely enlarged. There is severely elevated pulmonary artery systolic pressure.  4. Left atrial size was moderately dilated.  5. The mitral valve is normal in structure. Trivial mitral valve regurgitation. No evidence of mitral stenosis.  6. The aortic valve is normal in structure. Aortic valve regurgitation is not visualized. Mild to moderate aortic valve sclerosis/calcification is present, without any evidence of aortic stenosis.  7. The inferior vena cava is dilated in size with <50% respiratory  variability, suggesting right atrial pressure of 15 mmHg. FINDINGS  Left Ventricle: Left ventricular ejection fraction, by estimation, is 55 to 60%. The left ventricle has normal function. The left ventricle has no regional wall motion abnormalities. Definity contrast agent was given IV to delineate the left ventricular  endocardial borders. The left ventricular internal cavity size was normal in size. There is no left  ventricular hypertrophy. Left ventricular diastolic parameters were normal. Right Ventricle: The right ventricular size is severely enlarged. Right vetricular wall thickness was not assessed. Right ventricular systolic function is severely reduced. There is severely elevated pulmonary artery systolic pressure. The tricuspid regurgitant velocity is 4.13 m/s, and with an assumed right atrial pressure of 8 mmHg, the estimated right ventricular systolic pressure is 12.2 mmHg. Left Atrium: Left atrial size was moderately dilated. Right Atrium: Right atrial size was normal in size. Pericardium: There is no evidence of pericardial effusion. Mitral Valve: The mitral valve is normal in structure. There is mild thickening of the mitral valve leaflet(s). There is mild calcification of the mitral valve leaflet(s). Normal mobility of the mitral valve leaflets. Moderate mitral annular calcification. Trivial mitral valve regurgitation. No evidence of mitral valve stenosis. Tricuspid Valve: The tricuspid valve is normal in structure. Tricuspid valve regurgitation is mild . No evidence of tricuspid stenosis. Aortic Valve: The aortic valve is normal in structure. Aortic valve regurgitation is not visualized. Mild to moderate aortic valve sclerosis/calcification is present, without any evidence of aortic stenosis. Pulmonic Valve: The pulmonic valve was normal in structure. Pulmonic valve regurgitation is mild. No evidence of pulmonic stenosis. Aorta: The aortic root is normal in size and structure. Venous: The inferior vena cava is dilated in size with less than 50% respiratory variability, suggesting right atrial pressure of 15 mmHg. IAS/Shunts: No atrial level shunt detected by color flow Doppler. Additional Comments: Patient has evidence for significant right sided heart failure.  LEFT VENTRICLE PLAX 2D LVIDd:         5.14 cm  Diastology LVIDs:         3.58 cm  LV e' lateral:   3.75 cm/s LV PW:         1.18 cm  LV E/e' lateral:  26.7 LV IVS:        1.16 cm  LV e' medial:    4.65 cm/s LVOT diam:     2.00 cm  LV E/e' medial:  21.5 LV SV:         66 LV SV Index:   29 LVOT Area:     3.14 cm  RIGHT VENTRICLE RV Basal diam:  3.17 cm TAPSE (M-mode): 2.2 cm LEFT ATRIUM             Index LA diam:        5.10 cm 2.24 cm/m LA Vol (A2C):   78.4 ml 34.36 ml/m LA Vol (A4C):   87.7 ml 38.44 ml/m LA Biplane Vol: 87.8 ml 38.48 ml/m  AORTIC VALVE LVOT Vmax:   101.00 cm/s LVOT Vmean:  72.500 cm/s LVOT VTI:    0.211 m  AORTA Ao Root diam: 2.80 cm MITRAL VALVE                TRICUSPID VALVE MV Area (PHT): 4.12 cm     TR Peak grad:   68.2 mmHg MV Decel Time: 184 msec     TR Vmax:        413.00 cm/s MV E velocity: 100.00 cm/s  SHUNTS                             Systemic VTI:  0.21 m                             Systemic Diam: 2.00 cm Jenkins Rouge MD Electronically signed by Jenkins Rouge MD Signature Date/Time: 04/01/2019/2:27:05 PM    Final     Cardiac Studies   Echocardiogram 04/01/2019:  1. Patient has evidence for significant right sided heart failure.  2. Left ventricular ejection fraction, by estimation, is 55 to 60%. The  left ventricle has normal function. The left ventricle has no regional  wall motion abnormalities. Left ventricular diastolic parameters were  normal.  3. Right ventricular systolic function is severely reduced. The right  ventricular size is severely enlarged. There is severely elevated  pulmonary artery systolic pressure.  4. Left atrial size was moderately dilated.  5. The mitral valve is normal in structure. Trivial mitral valve  regurgitation. No evidence of mitral stenosis.  6. The aortic valve is normal in structure. Aortic valve regurgitation is  not visualized. Mild to moderate aortic valve sclerosis/calcification is  present, without any evidence of aortic stenosis.  7. The inferior vena cava is dilated in size with <50% respiratory  variability, suggesting right atrial  pressure of 15 mmHg.   Patient Profile     69 y.o. male with a hx  of chronic atrial fibrillation, diastolic CHF, and episodes of AKI who is being seen today for the evaluation of CHF at the request of Dr. Denton Brick.  Assessment & Plan    1.  Acute on chronic diastolic heart failure/right heart failure with anasarca: LVEF 55 to 60% by most recent echocardiogram with severe right ventricular enlargement and severely reduced RV systolic function with severe pulmonary hypertension.  IVC dilatation noted with estimated RAP of 15 mmHg.  Severe pulmonary hypertension and RV failure likely secondary to combination of morbid obesity and untreated obstructive sleep apnea/obesity hypoventilation syndrome.   He appears to be volume overloaded.  BUN 65, creatinine 2.04.  I doubt accuracy of I/O and weights.  Upon speaking with internal medicine, physical examination over the course of the last several days would suggest clinical improvement but he appears volume overloaded to me and the patient states that level of edema has not changed over the last several days.  Currently on IV Lasix 60 mg IV twice daily. I will switch to a furosemide infusion.  I will also give metolazone 2.5 mg today.  I will closely monitor renal function.  If renal function continues to worsen may need to get nephrology involved.  2.  History of nonischemic cardiomyopathy: LVEF 55 to 60% echocardiogram on 04/01/2019.  Felt to be tachycardia mediated.  Cardiac catheterization in 2008 showed no significant coronary artery disease.  3.  Permanent atrial fibrillation: Heart rate controlled on Cardizem CD 180 mg daily and Lopressor 50 mg twice daily.  Systemically anticoagulated with warfarin.  4.  Acute on chronic CKD stage III: Creatinine up to 2.04 with BUN of 65.  Currently on IV Lasix 60 mg twice daily. I will switch to a furosemide infusion.  I will also give metolazone 2.5 mg today.  I will closely monitor renal function.  If renal  function continues to worsen may need to get nephrology involved.  5.  Obstructive sleep apnea: Refuses CPAP.  Apparently getting new equipment at home.  6.  Hypertension: Blood pressure is normal.   For questions or updates, please contact [place] Please consult [email or web address] for contact info under        Signed, Kate Sable, MD  04/02/2019, 11:01 AM

## 2019-04-02 NOTE — Progress Notes (Signed)
ANTICOAGULATION CONSULT NOTE - Initial Consult  Pharmacy Consult for warfarin Indication: atrial fibrillation  Allergies  Allergen Reactions  . Codeine     Headache     Patient Measurements: Height: _0  (162.6 cm) Weight: 286 lb (129.7 kg) IBW/kg (Calculated) : 59.2   Vital Signs: Temp: 97.3 F (36.3 C) (03/30 0554) Temp Source: Oral (03/30 0554) BP: 113/77 (03/30 0554) Pulse Rate: 69 (03/30 0554)  Labs: Recent Labs    03/31/19 0530 04/01/19 0522 04/01/19 0855 04/02/19 0439 04/02/19 0800  HGB  --   --  9.6*  --   --   HCT  --   --  36.1*  --   --   PLT  --   --  206  --   --   LABPROT 23.5* 22.4*  --  20.5*  --   INR 2.1* 2.0*  --  1.8*  --   CREATININE 1.81*  --  1.93*  --  2.04*    Estimated Creatinine Clearance: 43.4 mL/min (A) (by C-G formula based on SCr of 2.04 mg/dL (H)).   Medical History: Past Medical History:  Diagnosis Date  . CAD (coronary artery disease)    LHC 2/08:  mRCA 50%, o/w no sig CAD, EF 25%  . Cardiomyopathy ([place])    EF 35-40% in 2/16 in setting of AF with RVR >> echo 5/16 with improved LVF with EF 65-70%  . Chronic atrial fibrillation ([place])   . Chronic diastolic heart failure ([facility])    HFPF - EF ~65-70% (OFTEN EXACERBATED BY AFIB)  . CKD (chronic kidney disease)    hx of worsening renal failure and hyperK+ in setting of acute diast CHF >> required CVVHD in 4/16 and dialysis x 1 in 5/16  . CKD stage 3 due to type 2 diabetes mellitus ([place]) 09/17/2015  . Colon cancer ([place])   . Colon polyps 09/21/2012   Tubular adenoma  . Diabetes ([place])   . History of echocardiogram    Echo 05/16/14:  mild LVH, vigorous LVF, EF 65-70%, no RWMA, mean AV gradient 9 mmHg, MAC, mild MR, mod LAE, mild RVE, mild reduced RVF, severe RAE  . Hyperlipidemia   . Hypertension   . Obesity hypoventilation syndrome ([place])   . Renal insufficiency   . Sleep apnea     Medications:  Medications Prior to Admission  Medication Sig Dispense Refill Last Dose  .  acetaminophen (TYLENOL) 325 MG tablet Take 2 tablets (650 mg total) by mouth every 6 (six) hours as needed for mild pain (or Fever >/= 101). 12 tablet 0 03/26/2019  . allopurinol (ZYLOPRIM) 300 MG tablet TAKE 1 TABLET BY MOUTH EVERY DAY 90 tablet 0 03/28/2019  . atorvastatin (LIPITOR) 20 MG tablet 1 tablet daily 90 tablet 3 03/28/2019  . azelastine (OPTIVAR) 0.05 % ophthalmic solution PLACE 2 DROPS INTO BOTH EYES 2 (TWO) TIMES DAILY. (Patient taking differently: Place 2 drops into the right eye 2 (two) times daily as needed (dry eye from allergies). ) 6 mL 5 03/28/2019  . benzonatate (TESSALON PERLES) 100 MG capsule Take 1 capsule (100 mg total) by mouth 2 (two) times daily. (Patient taking differently: Take 100 mg by mouth 2 (two) times daily as needed. ) 60 capsule 3 03/28/2019  . DILT-XR 180 MG 24 hr capsule TAKE 1 CAPSULE BY MOUTH EVERY DAY (Patient taking differently: Take 180 mg by mouth in the morning and at bedtime. ) 90 capsule 0 03/28/2019  . FARXIGA 5 MG TABS tablet Take 5  mg by mouth daily.   03/28/2019  . ferrous sulfate 325 (65 FE) MG tablet Take 325 mg by mouth daily with breakfast.    03/28/2019  . furosemide (LASIX) 80 MG tablet Take 1 tablet (80 mg total) by mouth daily. 30 tablet 2 03/28/2019  . insulin aspart (NOVOLOG FLEXPEN) 100 UNIT/ML FlexPen insulin aspart (novoLOG) injection 0-9 Units 0-9 Units, Subcutaneous, 3 times daily with meals, CBG < 70: Implement Hypoglycemia Standing Orders and refer to Hypoglycemia Standing Orders sidebar report CBG 70 - 120: 0 units CBG 121 - 150: 1 unit CBG 151 - 200: 2 units CBG 201 - 250: 3 units CBG 251 - 300: 5 units CBG 301 - 350: 7 units CBG 351 - 400: 9 units CBG > 400 (Patient taking differently: Inject 9 Units into the skin in the morning and at bedtime. insulin aspart (novoLOG) injection 0-9 Units 0-9 Units, Subcutaneous, 3 times daily with meals, CBG < 70: Implement Hypoglycemia Standing Orders and refer to Hypoglycemia Standing Orders sidebar report  CBG 70 - 120: 0 units CBG 121 - 150: 1 unit CBG 151 - 200: 2 units CBG 201 - 250: 3 units CBG 251 - 300: 5 units CBG 301 - 350: 7 units CBG 351 - 400: 9 units CBG > 400) 15 mL 11 03/28/2019  . insulin degludec (TRESIBA FLEXTOUCH) 100 UNIT/ML SOPN FlexTouch Pen Inject 0.18 mLs (18 Units total) into the skin at bedtime. 15 pen 1 03/28/2019  . losartan (COZAAR) 100 MG tablet Take 1 tablet (100 mg total) by mouth daily. 90 tablet 0 03/28/2019  . metFORMIN (GLUCOPHAGE) 500 MG tablet Take 1 tablet (500 mg total) by mouth 2 (two) times daily with a meal. 180 tablet 1 03/28/2019  . metoprolol tartrate (LOPRESSOR) 50 MG tablet Take 1 tablet (50 mg total) by mouth 2 (two) times daily. 180 tablet 3 03/28/2019  . pantoprazole (PROTONIX) 40 MG tablet Take 1 tablet (40 mg total) by mouth daily. (Patient taking differently: Take 40 mg by mouth daily as needed. ) 30 tablet 5 03/27/2019  . polyethylene glycol (MIRALAX / GLYCOLAX) 17 g packet Take 17 g by mouth daily. (Patient taking differently: Take 17 g by mouth daily as needed. ) 14 each 0 03/25/2019  . potassium chloride SA (KLOR-CON M20) 20 MEQ tablet TAKE 1/2 TABLET (10 MEQ TOTAL) BY MOUTH DAILY. NEED OV. *INS WILL COVER 4/19 OR LATER* 45 tablet 1 03/28/2019  . TRULICITY 1.5 UX/3.2TF SOPN INJECT 1.5 MG INTO THE SKIN ONCE A WEEK. 6 mL 0 03/22/2019  . warfarin (COUMADIN) 5 MG tablet Take 1 tablet (5 mg total) by mouth every evening. (Patient taking differently: Take 5 mg by mouth every evening. 12/2018: directions were "take as directed". 01/2019: directions were "take 1 tab po qd". Patient has been taking 1 tablet every other day, alternating 1.5 tabs on the other days.) 30 tablet 3 03/28/2019  . Insulin Pen Needle 32G X 4 MM MISC Use to give insulin daily Dx E11.29 (Patient not taking: Reported on 03/30/2019) 100 each 3 Not Taking at Unknown time  . polyvinyl alcohol (LIQUIFILM TEARS) 1.4 % ophthalmic solution Place 1 drop into both eyes as needed for dry eyes. (Patient not  taking: Reported on 03/30/2019) 15 mL 0 Not Taking at Unknown time    Assessment: Pharmacy consulted to dose warfarin in patient with atrial fibrillation.  Patient's home dose listed as alternating 5 mg and 7.5 mg doses.  INR 1.8  Goal of Therapy:  INR 2-3  Monitor platelets by anticoagulation protocol: Yes   Plan:  Warfarin 7.5 mg x 1 dose. Monitor daily INR and s/s of bleeding.  Margot Ables, PharmD Clinical Pharmacist 04/02/2019 10:29 AM

## 2019-04-03 DIAGNOSIS — I272 Pulmonary hypertension, unspecified: Secondary | ICD-10-CM

## 2019-04-03 DIAGNOSIS — E1122 Type 2 diabetes mellitus with diabetic chronic kidney disease: Secondary | ICD-10-CM

## 2019-04-03 DIAGNOSIS — R600 Localized edema: Secondary | ICD-10-CM

## 2019-04-03 DIAGNOSIS — E1165 Type 2 diabetes mellitus with hyperglycemia: Secondary | ICD-10-CM

## 2019-04-03 DIAGNOSIS — E1129 Type 2 diabetes mellitus with other diabetic kidney complication: Secondary | ICD-10-CM

## 2019-04-03 DIAGNOSIS — N189 Chronic kidney disease, unspecified: Secondary | ICD-10-CM

## 2019-04-03 LAB — CBC
HCT: 34.4 % — ABNORMAL LOW (ref 39.0–52.0)
Hemoglobin: 9.2 g/dL — ABNORMAL LOW (ref 13.0–17.0)
MCH: 24.1 pg — ABNORMAL LOW (ref 26.0–34.0)
MCHC: 26.7 g/dL — ABNORMAL LOW (ref 30.0–36.0)
MCV: 90.3 fL (ref 80.0–100.0)
Platelets: 212 10*3/uL (ref 150–400)
RBC: 3.81 MIL/uL — ABNORMAL LOW (ref 4.22–5.81)
RDW: 20.1 % — ABNORMAL HIGH (ref 11.5–15.5)
WBC: 5.4 10*3/uL (ref 4.0–10.5)
nRBC: 0 % (ref 0.0–0.2)

## 2019-04-03 LAB — GLUCOSE, CAPILLARY
Glucose-Capillary: 128 mg/dL — ABNORMAL HIGH (ref 70–99)
Glucose-Capillary: 141 mg/dL — ABNORMAL HIGH (ref 70–99)
Glucose-Capillary: 122 mg/dL — ABNORMAL HIGH (ref 70–99)

## 2019-04-03 LAB — PROTIME-INR
INR: 1.9 — ABNORMAL HIGH (ref 0.8–1.2)
Prothrombin Time: 21.9 s — ABNORMAL HIGH (ref 11.4–15.2)

## 2019-04-03 LAB — BASIC METABOLIC PANEL WITH GFR
Anion gap: 8 (ref 5–15)
BUN: 73 mg/dL — ABNORMAL HIGH (ref 8–23)
CO2: 28 mmol/L (ref 22–32)
Calcium: 8.6 mg/dL — ABNORMAL LOW (ref 8.9–10.3)
Chloride: 99 mmol/L (ref 98–111)
Creatinine, Ser: 2.13 mg/dL — ABNORMAL HIGH (ref 0.61–1.24)
GFR calc Af Amer: 36 mL/min — ABNORMAL LOW
GFR calc non Af Amer: 31 mL/min — ABNORMAL LOW
Glucose, Bld: 133 mg/dL — ABNORMAL HIGH (ref 70–99)
Potassium: 4.6 mmol/L (ref 3.5–5.1)
Sodium: 135 mmol/L (ref 135–145)

## 2019-04-03 MED ORDER — WARFARIN SODIUM 5 MG PO TABS
5.0000 mg | ORAL_TABLET | Freq: Once | ORAL | Status: AC
  Administered 2019-04-03: 5 mg via ORAL
  Filled 2019-04-03: qty 1

## 2019-04-03 MED ORDER — WARFARIN - PHARMACIST DOSING INPATIENT: Freq: Every day | Status: DC

## 2019-04-03 NOTE — Progress Notes (Signed)
ANTICOAGULATION CONSULT NOTE -   Pharmacy Consult for warfarin Indication: atrial fibrillation  Allergies  Allergen Reactions  . Codeine     Headache     Patient Measurements: Height: _0  (162.6 cm) Weight: 288 lb (130.6 kg) IBW/kg (Calculated) : 59.2   Vital Signs: Temp: 98.1 F (36.7 C) (03/31 0529) Temp Source: Oral (03/31 0529) BP: 129/77 (03/31 0529) Pulse Rate: 70 (03/31 0529)  Labs: Recent Labs    04/01/19 0522 04/01/19 0855 04/02/19 0439 04/02/19 0800 04/03/19 0435  HGB  --  9.6*  --   --  9.2*  HCT  --  36.1*  --   --  34.4*  PLT  --  206  --   --  212  LABPROT 22.4*  --  20.5*  --  21.9*  INR 2.0*  --  1.8*  --  1.9*  CREATININE  --  1.93*  --  2.04* 2.13*    Estimated Creatinine Clearance: 41.8 mL/min (A) (by C-G formula based on SCr of 2.13 mg/dL (H)).   Medical History: Past Medical History:  Diagnosis Date  . CAD (coronary artery disease)    LHC 2/08:  mRCA 50%, o/w no sig CAD, EF 25%  . Cardiomyopathy ([place])    EF 35-40% in 2/16 in setting of AF with RVR >> echo 5/16 with improved LVF with EF 65-70%  . Chronic atrial fibrillation ([place])   . Chronic diastolic heart failure (HCC)    HFPF - EF ~65-70% (OFTEN EXACERBATED BY AFIB)  . CKD (chronic kidney disease)    hx of worsening renal failure and hyperK+ in setting of acute diast CHF >> required CVVHD in 4/16 and dialysis x 1 in 5/16  . CKD stage 3 due to type 2 diabetes mellitus ([place]) 09/17/2015  . Colon cancer ([place])   . Colon polyps 09/21/2012   Tubular adenoma  . Diabetes ([place])   . History of echocardiogram    Echo 05/16/14:  mild LVH, vigorous LVF, EF 65-70%, no RWMA, mean AV gradient 9 mmHg, MAC, mild MR, mod LAE, mild RVE, mild reduced RVF, severe RAE  . Hyperlipidemia   . Hypertension   . Obesity hypoventilation syndrome ([place])   . Renal insufficiency   . Sleep apnea     Medications:  Medications Prior to Admission  Medication Sig Dispense Refill Last Dose  . acetaminophen  (TYLENOL) 325 MG tablet Take 2 tablets (650 mg total) by mouth every 6 (six) hours as needed for mild pain (or Fever >/= 101). 12 tablet 0 03/26/2019  . allopurinol (ZYLOPRIM) 300 MG tablet TAKE 1 TABLET BY MOUTH EVERY DAY 90 tablet 0 03/28/2019  . atorvastatin (LIPITOR) 20 MG tablet 1 tablet daily 90 tablet 3 03/28/2019  . azelastine (OPTIVAR) 0.05 % ophthalmic solution PLACE 2 DROPS INTO BOTH EYES 2 (TWO) TIMES DAILY. (Patient taking differently: Place 2 drops into the right eye 2 (two) times daily as needed (dry eye from allergies). ) 6 mL 5 03/28/2019  . benzonatate (TESSALON PERLES) 100 MG capsule Take 1 capsule (100 mg total) by mouth 2 (two) times daily. (Patient taking differently: Take 100 mg by mouth 2 (two) times daily as needed. ) 60 capsule 3 03/28/2019  . DILT-XR 180 MG 24 hr capsule TAKE 1 CAPSULE BY MOUTH EVERY DAY (Patient taking differently: Take 180 mg by mouth in the morning and at bedtime. ) 90 capsule 0 03/28/2019  . FARXIGA 5 MG TABS tablet Take 5 mg by mouth daily.   03/28/2019  .  ferrous sulfate 325 (65 FE) MG tablet Take 325 mg by mouth daily with breakfast.    03/28/2019  . furosemide (LASIX) 80 MG tablet Take 1 tablet (80 mg total) by mouth daily. 30 tablet 2 03/28/2019  . insulin aspart (NOVOLOG FLEXPEN) 100 UNIT/ML FlexPen insulin aspart (novoLOG) injection 0-9 Units 0-9 Units, Subcutaneous, 3 times daily with meals, CBG < 70: Implement Hypoglycemia Standing Orders and refer to Hypoglycemia Standing Orders sidebar report CBG 70 - 120: 0 units CBG 121 - 150: 1 unit CBG 151 - 200: 2 units CBG 201 - 250: 3 units CBG 251 - 300: 5 units CBG 301 - 350: 7 units CBG 351 - 400: 9 units CBG > 400 (Patient taking differently: Inject 9 Units into the skin in the morning and at bedtime. insulin aspart (novoLOG) injection 0-9 Units 0-9 Units, Subcutaneous, 3 times daily with meals, CBG < 70: Implement Hypoglycemia Standing Orders and refer to Hypoglycemia Standing Orders sidebar report CBG 70 - 120:  0 units CBG 121 - 150: 1 unit CBG 151 - 200: 2 units CBG 201 - 250: 3 units CBG 251 - 300: 5 units CBG 301 - 350: 7 units CBG 351 - 400: 9 units CBG > 400) 15 mL 11 03/28/2019  . insulin degludec (TRESIBA FLEXTOUCH) 100 UNIT/ML SOPN FlexTouch Pen Inject 0.18 mLs (18 Units total) into the skin at bedtime. 15 pen 1 03/28/2019  . losartan (COZAAR) 100 MG tablet Take 1 tablet (100 mg total) by mouth daily. 90 tablet 0 03/28/2019  . metFORMIN (GLUCOPHAGE) 500 MG tablet Take 1 tablet (500 mg total) by mouth 2 (two) times daily with a meal. 180 tablet 1 03/28/2019  . metoprolol tartrate (LOPRESSOR) 50 MG tablet Take 1 tablet (50 mg total) by mouth 2 (two) times daily. 180 tablet 3 03/28/2019  . pantoprazole (PROTONIX) 40 MG tablet Take 1 tablet (40 mg total) by mouth daily. (Patient taking differently: Take 40 mg by mouth daily as needed. ) 30 tablet 5 03/27/2019  . polyethylene glycol (MIRALAX / GLYCOLAX) 17 g packet Take 17 g by mouth daily. (Patient taking differently: Take 17 g by mouth daily as needed. ) 14 each 0 03/25/2019  . potassium chloride SA (KLOR-CON M20) 20 MEQ tablet TAKE 1/2 TABLET (10 MEQ TOTAL) BY MOUTH DAILY. NEED OV. *INS WILL COVER 4/19 OR LATER* 45 tablet 1 03/28/2019  . TRULICITY 1.5 YS/0.6TK SOPN INJECT 1.5 MG INTO THE SKIN ONCE A WEEK. 6 mL 0 03/22/2019  . warfarin (COUMADIN) 5 MG tablet Take 1 tablet (5 mg total) by mouth every evening. (Patient taking differently: Take 5 mg by mouth every evening. 12/2018: directions were "take as directed". 01/2019: directions were "take 1 tab po qd". Patient has been taking 1 tablet every other day, alternating 1.5 tabs on the other days.) 30 tablet 3 03/28/2019  . Insulin Pen Needle 32G X 4 MM MISC Use to give insulin daily Dx E11.29 (Patient not taking: Reported on 03/30/2019) 100 each 3 Not Taking at Unknown time  . polyvinyl alcohol (LIQUIFILM TEARS) 1.4 % ophthalmic solution Place 1 drop into both eyes as needed for dry eyes. (Patient not taking: Reported  on 03/30/2019) 15 mL 0 Not Taking at Unknown time    Assessment: Pharmacy consulted to dose warfarin in patient with atrial fibrillation.  Patient's home dose listed as alternating 5 mg and 7.5 mg doses.  INR 1.9  Goal of Therapy:  INR 2-3 Monitor platelets by anticoagulation protocol: Yes   Plan:  Warfarin 5 mg x 1 dose. Monitor daily INR and s/s of bleeding.  Margot Ables, PharmD Clinical Pharmacist 04/03/2019 8:34 AM

## 2019-04-03 NOTE — Progress Notes (Signed)
PROGRESS NOTE [facility]   William Flores  XKP:[id number]  DOB: November 26, 1950  DOA: 03/28/2019 PCP: [name], [name], MD   Brief Admission Hx: 69 year old male with an ICM, diabetes mellitus type 2, hypertension, CKD, chronic A. fib on anticoagulation, history of colon cancer, MGUS presented with progressive weight gain fluid retention and was markedly volume overloaded with DOE admitted for IV diuresis after failing oral diuretics as outpatient management.  MDM/Assessment & Plan:   1. Heart failure preserved EF-he has known nonischemic cardiomyopathy and right heart failure with severe pulmonary hypertension.  He presented with anasarca.  He is markedly volume overloaded.  Cardiology is seen him and started him on an IV Lasix infusion.  He seems to be responding well to this therapy.  He has diuresed better on the IV Lasix infusion.  He was given a dose of metolazone on 04/02/19.  The patient responded well to this.  His creatinine is slightly bumped today and cardiology is planning to hold metolazone today.  They will continue the IV Lasix infusion as he seems to be responding.  If his creatinine continues to bump they are considering transferring him to [facility] to be evaluated by the advanced heart failure service. 2. Acute on chronic respiratory failure with hypoxia-secondary to diastolic CHF exacerbation-he is improving slowly with IV diuresis he remains markedly volume overloaded and will need ongoing diuresis. 3. Stage IIIa CKD-his creatinine has slightly worsened and now greater than 2.  We are holding losartan and Metformin from home use.  Continue to monitor closely in the setting of IV diuresis.  We are working hard to diurese him and tolerating a slight bump in creatinine.  We may need to get nephrology involved if he continues to have elevation of creatinine. 4. Permanent atrial fibrillation-he is on warfarin for full anticoagulation and pharmacy is working to adjust his  warfarin doses in the hospital, monitoring PT/INR daily.  His rate is well controlled on metoprolol 50 mg twice a day and Cardizem CD. 5. Type 2 diabetes mellitus-Metformin discontinued, Trulicity on hold, he is on Lantus and NovoLog sliding scale with frequent CBG monitoring.  DVT prophylaxis: Warfarin, SCDs Code Status: Full Family Communication: Patient briefed at bedside on plan of care and verbalizes understanding Disposition Plan: Continue IV Lasix infusion for diuresis as he remains markedly volume overloaded and will continue to need diuresis  Consultants:  Heart care cardiology  Procedures:    Antimicrobials:     Subjective: Patient reports that he feels a little better since he started the Lasix infusion, he remains short of breath and volume overloaded.  His leg edema slightly improved.  Objective: Vitals:   04/02/19 2139 04/03/19 0529 04/03/19 0718 04/03/19 1344  BP: 126/63 129/77  111/70  Pulse: 67 70  63  Resp: (!) 21 20  18   Temp: 98.7 F (37.1 C) 98.1 F (36.7 C)  97.8 F (36.6 C)  TempSrc: Oral Oral  Oral  SpO2: 93% 92%  92%  Weight:   130.6 kg   Height:        Intake/Output Summary (Last 24 hours) at 04/03/2019 1412 Last data filed at 04/03/2019 0543 Gross per 24 hour  Intake 592.21 ml  Output 1000 ml  Net -407.79 ml   Filed Weights   04/01/19 0547 04/02/19 0554 04/03/19 0718  Weight: 130.3 kg 129.7 kg 130.6 kg     REVIEW OF SYSTEMS  As per history otherwise all reviewed and reported negative  Exam:  General exam:  Morbidly obese male sitting in bed, awake and alert Respiratory system: Bibasilar crackles. No increased work of breathing. Cardiovascular system: S1 & S2 heard. No JVD, murmurs, gallops, clicks or pedal edema. Gastrointestinal system: Abdomen is distended positive fluid wave, soft and nontender. Normal bowel sounds heard. Central nervous system: Alert and oriented. No focal neurological deficits. Extremities: 2+ pitting edema  bilateral lower extremities  Data Reviewed: Basic Metabolic Panel: Recent Labs  Lab 03/30/19 0552 03/31/19 0530 04/01/19 0855 04/02/19 0800 04/03/19 0435  NA 139 140 138 136 135  K 4.3 4.7 4.6 4.9 4.6  CL 100 103 100 101 99  CO2 29 29 30 25 28   GLUCOSE 208* 123* 124* 165* 133*  BUN 50* 56* 61* 65* 73*  CREATININE 1.96* 1.81* 1.93* 2.04* 2.13*  CALCIUM 9.0 8.9 9.0 8.6* 8.6*  MG  --  2.2  --   --   --   PHOS  --   --   --  5.2*  --    Liver Function Tests: Recent Labs  Lab 03/28/19 0912 04/02/19 0800  AST 16  --   ALT 13  --   ALKPHOS 68  --   BILITOT 0.8  --   PROT 6.4*  --   ALBUMIN 3.2* 3.4*   No results for input(s): LIPASE, AMYLASE in the last 168 hours. No results for input(s): AMMONIA in the last 168 hours. CBC: Recent Labs  Lab 03/28/19 0912 03/29/19 0446 04/01/19 0855 04/03/19 0435  WBC 4.8 5.1 5.0 5.4  NEUTROABS 3.4  --   --   --   HGB 9.4* 9.3* 9.6* 9.2*  HCT 35.4* 33.7* 36.1* 34.4*  MCV 93.7 91.3 90.9 90.3  PLT 197 198 206 212   Cardiac Enzymes: No results for input(s): CKTOTAL, CKMB, CKMBINDEX, TROPONINI in the last 168 hours. CBG (last 3)  Recent Labs    04/02/19 2140 04/03/19 0736 04/03/19 1111  GLUCAP 164* 128* 141*   Recent Results (from the past 240 hour(s))  SARS CORONAVIRUS 2 (TAT 6-24 HRS) Nasopharyngeal Nasopharyngeal Swab     Status: None   Collection Time: 03/28/19  3:07 PM   Specimen: Nasopharyngeal Swab  Result Value Ref Range Status   SARS Coronavirus 2 NEGATIVE NEGATIVE Final    Comment: (NOTE) SARS-CoV-2 target nucleic acids are NOT DETECTED. The SARS-CoV-2 RNA is generally detectable in upper and lower respiratory specimens during the acute phase of infection. Negative results do not preclude SARS-CoV-2 infection, do not rule out co-infections with other pathogens, and should not be used as the sole basis for treatment or other patient management decisions. Negative results must be combined with clinical  observations, patient history, and epidemiological information. The expected result is Negative. Fact Sheet for Patients: SugarRoll.be Fact Sheet for Healthcare Providers: https://www.woods-mathews.com/ This test is not yet approved or cleared by the [place] FDA and  has been authorized for detection and/or diagnosis of SARS-CoV-2 by FDA under an Emergency Use Authorization (EUA). This EUA will remain  in effect (meaning this test can be used) for the duration of the COVID-19 declaration under Section 56 4(b)(1) of the Act, 21 U.S.C. section 360bbb-3(b)(1), unless the authorization is terminated or revoked sooner. Performed at [facility], [place] [address], [place], [place] [address]      Studies: No results found.   Scheduled Meds: . allopurinol  300 mg Oral Daily  . atorvastatin  20 mg Oral QPM  . benzonatate  100 mg Oral BID  . diltiazem  180 mg Oral Daily  . ferrous sulfate  325 mg Oral Q breakfast  . insulin aspart  0-15 Units Subcutaneous TID WC  . insulin aspart  0-5 Units Subcutaneous QHS  . insulin glargine  18 Units Subcutaneous QHS  . metoprolol tartrate  50 mg Oral BID  . pantoprazole  40 mg Oral Daily  . polyethylene glycol  17 g Oral Daily  . potassium chloride SA  20 mEq Oral Daily  . sodium chloride flush  3 mL Intravenous Q12H  . warfarin  5 mg Oral ONCE-1600  . Warfarin - Pharmacist Dosing Inpatient   Does not apply q1600   Continuous Infusions: . sodium chloride    . furosemide (LASIX) infusion 8 mg/hr (04/02/19 1445)    Principal Problem:   Acute on chronic heart failure with preserved ejection fraction (HFpEF) (HCC) Active Problems:   Hypertensive heart disease   Morbid obesity (HCC)   OSA (obstructive sleep apnea)   Nonischemic cardiomyopathy (HCC)   Anticoagulated on Coumadin   Atrial fibrillation with slow ventricular response (HCC)   AKI (acute kidney injury) ([place])   DM (diabetes  mellitus), type 2, uncontrolled, with renal complications ([place])   CKD stage 3 due to type 2 diabetes mellitus ([place])   Pulmonary hypertension ([place])   Acute exacerbation of CHF (congestive heart failure) ([place])  Time spent:   Irwin Brakeman, MD [facility] 04/03/2019, 2:12 PM    LOS: 5 days  How to contact the [facility] Attending or Consulting provider [place] or covering provider during after hours 7P -7A, for this patient?  1. Check the care team in [facility] and look for a) attending/consulting TR[facility] provider listed and b) the [facility] team listed 2. Log into [email or web address] and use [place]'s universal password to access. If you do not have the password, please contact the hospital operator. 3. Locate the [facility] provider you are looking for under [facility] and page to a number that you can be directly reached. 4. If you still have difficulty reaching the provider, please page the [facility] (Director on Call) for the Hospitalists listed on amion for assistance.

## 2019-04-03 NOTE — Plan of Care (Signed)

## 2019-04-03 NOTE — Progress Notes (Signed)
Patient refuses the use of CPAP

## 2019-04-03 NOTE — Care Management Important Message (Signed)
Important Message  Patient Details  Name: William Flores MRN: [id number] Date of Birth: [birth year]   Medicare Important Message Given:  Yes     [name] 04/03/2019, 2:39 PM

## 2019-04-03 NOTE — Progress Notes (Addendum)
Progress Note  Patient Name: William Flores Date of Encounter: 04/03/2019  Primary Cardiologist: Minus Breeding, MD   Subjective   Breathing improved. No reported orthopnea or PND. Still with significant abdominal distension and scrotal edema per his report. No chest pain or palpitations.   Inpatient Medications    Scheduled Meds: . allopurinol  300 mg Oral Daily  . atorvastatin  20 mg Oral QPM  . benzonatate  100 mg Oral BID  . diltiazem  180 mg Oral Daily  . ferrous sulfate  325 mg Oral Q breakfast  . insulin aspart  0-15 Units Subcutaneous TID WC  . insulin aspart  0-5 Units Subcutaneous QHS  . insulin glargine  18 Units Subcutaneous QHS  . metoprolol tartrate  50 mg Oral BID  . pantoprazole  40 mg Oral Daily  . polyethylene glycol  17 g Oral Daily  . potassium chloride SA  20 mEq Oral Daily  . sodium chloride flush  3 mL Intravenous Q12H  . warfarin  5 mg Oral ONCE-1600  . Warfarin - Pharmacist Dosing Inpatient   Does not apply q1600   Continuous Infusions: . sodium chloride    . furosemide (LASIX) infusion 8 mg/hr (04/02/19 1445)   PRN Meds: sodium chloride, acetaminophen **OR** acetaminophen, acetaminophen, guaiFENesin-dextromethorphan, ondansetron **OR** ondansetron (ZOFRAN) IV, polyethylene glycol, polyvinyl alcohol, sodium chloride flush, traZODone   Vital Signs    Vitals:   04/02/19 0554 04/02/19 2139 04/03/19 0529 04/03/19 0718  BP: 113/77 126/63 129/77   Pulse: 69 67 70   Resp: 18 (!) 21 20   Temp: (!) 97.3 F (36.3 C) 98.7 F (37.1 C) 98.1 F (36.7 C)   TempSrc: Oral Oral Oral   SpO2: 95% 93% 92%   Weight: 129.7 kg   130.6 kg  Height:        Intake/Output Summary (Last 24 hours) at 04/03/2019 0840 Last data filed at 04/03/2019 0543 Gross per 24 hour  Intake 592.21 ml  Output 1000 ml  Net -407.79 ml    Last 3 Weights 04/03/2019 04/02/2019 04/01/2019  Weight (lbs) 288 lb 286 lb 287 lb 3.2 oz  Weight (kg) 130.636 kg 129.729 kg 130.273 kg       Telemetry    Not connected to telemetry.   ECG    No new tracings.   Physical Exam   General: Caucasian male appearing in no acute distress. Head: Normocephalic, atraumatic.  Neck: Supple without bruits, JVD at 9 cm. Lungs:  Resp regular and unlabored, decreased along bases. Heart: Irregularly irregular, S1, S2, no S3, S4, or murmur; no rub. Abdomen: Soft, non-tender, non-distended with normoactive bowel sounds. No hepatomegaly. No rebound/guarding. No obvious abdominal masses. Extremities: No clubbing or cyanosis, 2+ edema bilaterally. Distal pedal pulses are 2+ bilaterally. Neuro: Alert and oriented X 3. Moves all extremities spontaneously. Psych: Normal affect.  Labs    Chemistry Recent Labs  Lab 03/28/19 0912 03/29/19 0446 04/01/19 0855 04/02/19 0800 04/03/19 0435  NA 141   < > 138 136 135  K 3.9   < > 4.6 4.9 4.6  CL 103   < > 100 101 99  CO2 27   < > 30 25 28   GLUCOSE 137*   < > 124* 165* 133*  BUN 50*   < > 61* 65* 73*  CREATININE 1.82*   < > 1.93* 2.04* 2.13*  CALCIUM 8.7*   < > 9.0 8.6* 8.6*  PROT 6.4*  --   --   --   --  ALBUMIN 3.2*  --   --  3.4*  --   AST 16  --   --   --   --   ALT 13  --   --   --   --   ALKPHOS 68  --   --   --   --   BILITOT 0.8  --   --   --   --   GFRNONAA 38*   < > 35* 33* 31*  GFRAA 44*   < > 41* 38* 36*  ANIONGAP 11   < > 8 10 8    < > = values in this interval not displayed.     Hematology Recent Labs  Lab 03/29/19 0446 04/01/19 0855 04/03/19 0435  WBC 5.1 5.0 5.4  RBC 3.69* 3.97* 3.81*  HGB 9.3* 9.6* 9.2*  HCT 33.7* 36.1* 34.4*  MCV 91.3 90.9 90.3  MCH 25.2* 24.2* 24.1*  MCHC 27.6* 26.6* 26.7*  RDW 20.0* 19.9* 20.1*  PLT 198 206 212    Cardiac EnzymesNo results for input(s): TROPONINI in the last 168 hours. No results for input(s): TROPIPOC in the last 168 hours.   BNP Recent Labs  Lab 03/28/19 0912  BNP 276.0*     DDimer No results for input(s): DDIMER in the last 168 hours.   Radiology     ECHOCARDIOGRAM COMPLETE  Result Date: 04/01/2019    ECHOCARDIOGRAM REPORT   Patient Name:   JEANETTE MOFFATT Date of Exam: 04/01/2019 Medical Rec #:  [id number]      Height:       64.0 in Accession #:    [id number]     Weight:       287.2 lb Date of Birth:  [birth year]      BSA:          2.282 m Patient Age:    15 years       BP:           128/70 mmHg Patient Gender: M              HR:           83 bpm. Exam Location:  [facility] Procedure: 2D Echo, Cardiac Doppler, Color Doppler and Intracardiac            Opacification Agent Indications:    CHF  History:        Patient has prior history of Echocardiogram examinations, most                 recent 12/28/2018. CHF and Cardiomyopathy, Morbid obesity,                 Arrythmias:Atrial Fibrillation, Signs/Symptoms:Dyspnea; Risk                 Factors:Hypertension and Diabetes. OSA, CKD.  Sonographer:    Dustin Flock RDCS Referring Phys: [place]  1. Patient has evidence for significant right sided heart failure.  2. Left ventricular ejection fraction, by estimation, is 55 to 60%. The left ventricle has normal function. The left ventricle has no regional wall motion abnormalities. Left ventricular diastolic parameters were normal.  3. Right ventricular systolic function is severely reduced. The right ventricular size is severely enlarged. There is severely elevated pulmonary artery systolic pressure.  4. Left atrial size was moderately dilated.  5. The mitral valve is normal in structure. Trivial mitral valve regurgitation. No evidence of mitral stenosis.  6. The aortic valve is normal in  structure. Aortic valve regurgitation is not visualized. Mild to moderate aortic valve sclerosis/calcification is present, without any evidence of aortic stenosis.  7. The inferior vena cava is dilated in size with <50% respiratory variability, suggesting right atrial pressure of 15 mmHg. FINDINGS  Left Ventricle: Left ventricular ejection fraction, by  estimation, is 55 to 60%. The left ventricle has normal function. The left ventricle has no regional wall motion abnormalities. Definity contrast agent was given IV to delineate the left ventricular  endocardial borders. The left ventricular internal cavity size was normal in size. There is no left ventricular hypertrophy. Left ventricular diastolic parameters were normal. Right Ventricle: The right ventricular size is severely enlarged. Right vetricular wall thickness was not assessed. Right ventricular systolic function is severely reduced. There is severely elevated pulmonary artery systolic pressure. The tricuspid regurgitant velocity is 4.13 m/s, and with an assumed right atrial pressure of 8 mmHg, the estimated right ventricular systolic pressure is 49.7 mmHg. Left Atrium: Left atrial size was moderately dilated. Right Atrium: Right atrial size was normal in size. Pericardium: There is no evidence of pericardial effusion. Mitral Valve: The mitral valve is normal in structure. There is mild thickening of the mitral valve leaflet(s). There is mild calcification of the mitral valve leaflet(s). Normal mobility of the mitral valve leaflets. Moderate mitral annular calcification. Trivial mitral valve regurgitation. No evidence of mitral valve stenosis. Tricuspid Valve: The tricuspid valve is normal in structure. Tricuspid valve regurgitation is mild . No evidence of tricuspid stenosis. Aortic Valve: The aortic valve is normal in structure. Aortic valve regurgitation is not visualized. Mild to moderate aortic valve sclerosis/calcification is present, without any evidence of aortic stenosis. Pulmonic Valve: The pulmonic valve was normal in structure. Pulmonic valve regurgitation is mild. No evidence of pulmonic stenosis. Aorta: The aortic root is normal in size and structure. Venous: The inferior vena cava is dilated in size with less than 50% respiratory variability, suggesting right atrial pressure of 15 mmHg.  IAS/Shunts: No atrial level shunt detected by color flow Doppler. Additional Comments: Patient has evidence for significant right sided heart failure.  LEFT VENTRICLE PLAX 2D LVIDd:         5.14 cm  Diastology LVIDs:         3.58 cm  LV e' lateral:   3.75 cm/s LV PW:         1.18 cm  LV E/e' lateral: 26.7 LV IVS:        1.16 cm  LV e' medial:    4.65 cm/s LVOT diam:     2.00 cm  LV E/e' medial:  21.5 LV SV:         66 LV SV Index:   29 LVOT Area:     3.14 cm  RIGHT VENTRICLE RV Basal diam:  3.17 cm TAPSE (M-mode): 2.2 cm LEFT ATRIUM             Index LA diam:        5.10 cm 2.24 cm/m LA Vol (A2C):   78.4 ml 34.36 ml/m LA Vol (A4C):   87.7 ml 38.44 ml/m LA Biplane Vol: 87.8 ml 38.48 ml/m  AORTIC VALVE LVOT Vmax:   101.00 cm/s LVOT Vmean:  72.500 cm/s LVOT VTI:    0.211 m  AORTA Ao Root diam: 2.80 cm MITRAL VALVE                TRICUSPID VALVE MV Area (PHT): 4.12 cm     TR Peak grad:  68.2 mmHg MV Decel Time: 184 msec     TR Vmax:        413.00 cm/s MV E velocity: 100.00 cm/s                             SHUNTS                             Systemic VTI:  0.21 m                             Systemic Diam: 2.00 cm Jenkins Rouge MD Electronically signed by Jenkins Rouge MD Signature Date/Time: 04/01/2019/2:27:05 PM    Final     Cardiac Studies   Echocardiogram: 04/01/2019 IMPRESSIONS    1. Patient has evidence for significant right sided heart failure.  2. Left ventricular ejection fraction, by estimation, is 55 to 60%. The  left ventricle has normal function. The left ventricle has no regional  wall motion abnormalities. Left ventricular diastolic parameters were  normal.  3. Right ventricular systolic function is severely reduced. The right  ventricular size is severely enlarged. There is severely elevated  pulmonary artery systolic pressure.  4. Left atrial size was moderately dilated.  5. The mitral valve is normal in structure. Trivial mitral valve  regurgitation. No evidence of mitral  stenosis.  6. The aortic valve is normal in structure. Aortic valve regurgitation is  not visualized. Mild to moderate aortic valve sclerosis/calcification is  present, without any evidence of aortic stenosis.  7. The inferior vena cava is dilated in size with <50% respiratory  variability, suggesting right atrial pressure of 15 mmHg.   Patient Profile     69 y.o. male w/ PMH of permanent atrial fibrillation, chronic diastolic CHF, history of NICM (EF 25% in 2008 with cath showing no significant CAD --> EF normalized by repeat studies), HTN, HLD, Type 2 DM, OSA and Stage 3 CKD who presented to [facility] ED on 03/28/2019 for evaluation of worsening dyspnea and edema. Admitted with acute CHF exacerbation.   Assessment & Plan    1. Acute on Chronic Diastolic CHF/RV Failure - BNP elevated to 276 on admission and CXR consistent with CHF. Repeat echo shows a preserved EF of 55-60% with no regional WMA. Does have severely reduced RV function and severely elevated PA Pressures.  - was previously on IV Lasix 34m BID and transitioned to a Lasix Infusion at 8 mg/hr yesterday due to poor urine output. Also received 2.542mMetolazone. Strict I&O's not fully recorded as urine occurrences listed. Weight recorded as having increased by 2 lbs since yesterday (unsure of accuracy of this) but he reports increased urinary frequency with Lasix drip. Weight recorded as 249 lbs in 12/2018, at 243 lbs during office visit in 02/2019 and is in the 280's currently. Suspect weight gain is a mix of fluid weight and increased caloric intake as he reports his baseline weight is in the 280's but had declined in the 240's while at SNF but "he has an appetite again" and thinks this played a role.  - given rise in creatinine from 2.04 to 2.13, would hold Metolazone today. Continue with IV Lasix infusion. Reviewed with the patient again today that if renal function continues to worsen and diuresis slows, [facility] given his [facility] dysfunction  but will continue with diuresis here for now given improvement with Lasix drip.   2. History of NICM - EF previously 25% in 2008 with cath showing no significant CAD at that time. Felt to be tachycardia-mediated. He denies any recent chest pain and EF has normalized by repeat echocardiogram. - remains on Lopressor. Also on Cardizem CD for rate-control given normalization of EF. ARB held due to AKI.    3. Permanent Atrial Fibrillation - rates have been well-controlled in the 60's to 70's. Remains on Cardizem CD 150m daily and Lopressor 551mBID for rate-control. On Coumadin for anticoagulation and being dosed by Pharmacy. INR 1.9 this AM.   4. Acute on Chronic Stage 3 CKD - baseline creatinine 1.3 - 1.5. Elevated to 1.82 on admission and continues to trend upwards, especially over the past 3 days (1.93 --> 2.04 --> 2.13). PTA Losartan currently held.   5. OSA - reports a CPAP is scheduled to be delivered to his home but he has been staying with his daughter, therefore using nasal cannula at night. Difficulty using hospital CPAP due to noise per his report.   6. Anemia - Hgb 9.2 this AM which is close to his baseline. No reports of active bleeding.    For questions or updates, please contact [facility] consult [email or web address] for contact info under Cardiology/STEMI.   Signed, BrErma Heritage PA-C 8:40 AM 04/03/2019 Pager: [phone] patient was seen and examined, and I agree with the history, physical exam, assessment and plan as documented above, with modifications made above and as noted below.  He feels that his breathing has improved to some degree since switching to Lasix infusion. Slight elevation in creatinine and he did receive 2.5 mg metolazone yesterday. Will continue Lasix infusion at 8 mg/hr as he is still markedly volume overloaded with abdominal distention and bilateral lower extremity and scrotal  edema.   SuKate SableMD, FASouth Lyon Medical Center3/31/2021 9:33 AM

## 2019-04-04 DIAGNOSIS — I5021 Acute systolic (congestive) heart failure: Secondary | ICD-10-CM

## 2019-04-04 DIAGNOSIS — I4891 Unspecified atrial fibrillation: Secondary | ICD-10-CM

## 2019-04-04 LAB — BASIC METABOLIC PANEL
Anion gap: 12 (ref 5–15)
BUN: 75 mg/dL — ABNORMAL HIGH (ref 8–23)
CO2: 29 mmol/L (ref 22–32)
Calcium: 9.1 mg/dL (ref 8.9–10.3)
Chloride: 97 mmol/L — ABNORMAL LOW (ref 98–111)
Creatinine, Ser: 2.26 mg/dL — ABNORMAL HIGH (ref 0.61–1.24)
GFR calc Af Amer: 34 mL/min — ABNORMAL LOW (ref >60)
GFR calc non Af Amer: 29 mL/min — ABNORMAL LOW (ref >60)
Glucose, Bld: 113 mg/dL — ABNORMAL HIGH (ref 70–99)
Potassium: 4.3 mmol/L (ref 3.5–5.1)
Sodium: 138 mmol/L (ref 135–145)

## 2019-04-04 LAB — GLUCOSE, CAPILLARY
Glucose-Capillary: 108 mg/dL — ABNORMAL HIGH (ref 70–99)
Glucose-Capillary: 158 mg/dL — ABNORMAL HIGH (ref 70–99)
Glucose-Capillary: 130 mg/dL — ABNORMAL HIGH (ref 70–99)
Glucose-Capillary: 145 mg/dL — ABNORMAL HIGH (ref 70–99)

## 2019-04-04 LAB — PROTIME-INR
INR: 1.8 — ABNORMAL HIGH (ref 0.8–1.2)
Prothrombin Time: 21.2 seconds — ABNORMAL HIGH (ref 11.4–15.2)

## 2019-04-04 LAB — MAGNESIUM: Magnesium: 2.6 mg/dL — ABNORMAL HIGH (ref 1.7–2.4)

## 2019-04-04 MED ORDER — WARFARIN SODIUM 7.5 MG PO TABS
7.5000 mg | ORAL_TABLET | Freq: Once | ORAL | Status: AC
  Administered 2019-04-04: 7.5 mg via ORAL
  Filled 2019-04-04: qty 1

## 2019-04-04 NOTE — Plan of Care (Signed)
  Problem: Education: Goal: Knowledge of General Education information will improve Description: Including pain rating scale, medication(s)/side effects and non-pharmacologic comfort measures Outcome: Progressing   Problem: Clinical Measurements: Goal: Ability to maintain clinical measurements within normal limits will improve Outcome: Progressing Goal: Respiratory complications will improve Outcome: Progressing Goal: Cardiovascular complication will be avoided Outcome: Progressing   Problem: Activity: Goal: Risk for activity intolerance will decrease Outcome: Progressing   Problem: Nutrition: Goal: Adequate nutrition will be maintained Outcome: Progressing   Problem: Safety: Goal: Ability to remain free from injury will improve Outcome: Progressing   Problem: Skin Integrity: Goal: Risk for impaired skin integrity will decrease Outcome: Progressing

## 2019-04-04 NOTE — Progress Notes (Signed)
ANTICOAGULATION CONSULT NOTE -   Pharmacy Consult for warfarin Indication: atrial fibrillation  Allergies  Allergen Reactions  . Codeine     Headache     Patient Measurements: Height: _0  (162.6 cm) Weight: 288 lb (130.6 kg) IBW/kg (Calculated) : 59.2   Vital Signs: Temp: 98.5 F (36.9 C) (03/31 2111) Temp Source: Oral (03/31 2111) BP: 112/67 (03/31 2111) Pulse Rate: 67 (03/31 2111)  Labs: Recent Labs    04/01/19 0855 04/02/19 0439 04/02/19 0800 04/03/19 0435 04/04/19 0448  HGB 9.6*  --   --  9.2*  --   HCT 36.1*  --   --  34.4*  --   PLT 206  --   --  212  --   LABPROT  --  20.5*  --  21.9* 21.2*  INR  --  1.8*  --  1.9* 1.8*  CREATININE 1.93*  --  2.04* 2.13*  --     Estimated Creatinine Clearance: 41.8 mL/min (A) (by C-G formula based on SCr of 2.13 mg/dL (H)).   Medical History: Past Medical History:  Diagnosis Date  . CAD (coronary artery disease)    LHC 2/08:  mRCA 50%, o/w no sig CAD, EF 25%  . Cardiomyopathy ([place])    EF 35-40% in 2/16 in setting of AF with RVR >> echo 5/16 with improved LVF with EF 65-70%  . Chronic atrial fibrillation ([place])   . Chronic diastolic heart failure (H[facility])    HFPF - EF ~65-70% (OFTEN EXACERBATED BY AFIB)  . CKD (chronic kidney disease)    hx of worsening renal failure and hyperK+ in setting of acute diast CHF >> required CVVHD in 4/16 and dialysis x 1 in 5/16  . CKD stage 3 due to type 2 diabetes mellitus ([place]) 09/17/2015  . Colon cancer ([place])   . Colon polyps 09/21/2012   Tubular adenoma  . Diabetes ([place])   . History of echocardiogram    Echo 05/16/14:  mild LVH, vigorous LVF, EF 65-70%, no RWMA, mean AV gradient 9 mmHg, MAC, mild MR, mod LAE, mild RVE, mild reduced RVF, severe RAE  . Hyperlipidemia   . Hypertension   . Obesity hypoventilation syndrome ([place])   . Renal insufficiency   . Sleep apnea     Medications:  Medications Prior to Admission  Medication Sig Dispense Refill Last Dose  . acetaminophen  (TYLENOL) 325 MG tablet Take 2 tablets (650 mg total) by mouth every 6 (six) hours as needed for mild pain (or Fever >/= 101). 12 tablet 0 03/26/2019  . allopurinol (ZYLOPRIM) 300 MG tablet TAKE 1 TABLET BY MOUTH EVERY DAY 90 tablet 0 03/28/2019  . atorvastatin (LIPITOR) 20 MG tablet 1 tablet daily 90 tablet 3 03/28/2019  . azelastine (OPTIVAR) 0.05 % ophthalmic solution PLACE 2 DROPS INTO BOTH EYES 2 (TWO) TIMES DAILY. (Patient taking differently: Place 2 drops into the right eye 2 (two) times daily as needed (dry eye from allergies). ) 6 mL 5 03/28/2019  . benzonatate (TESSALON PERLES) 100 MG capsule Take 1 capsule (100 mg total) by mouth 2 (two) times daily. (Patient taking differently: Take 100 mg by mouth 2 (two) times daily as needed. ) 60 capsule 3 03/28/2019  . DILT-XR 180 MG 24 hr capsule TAKE 1 CAPSULE BY MOUTH EVERY DAY (Patient taking differently: Take 180 mg by mouth in the morning and at bedtime. ) 90 capsule 0 03/28/2019  . FARXIGA 5 MG TABS tablet Take 5 mg by mouth daily.   03/28/2019  .  ferrous sulfate 325 (65 FE) MG tablet Take 325 mg by mouth daily with breakfast.    03/28/2019  . furosemide (LASIX) 80 MG tablet Take 1 tablet (80 mg total) by mouth daily. 30 tablet 2 03/28/2019  . insulin aspart (NOVOLOG FLEXPEN) 100 UNIT/ML FlexPen insulin aspart (novoLOG) injection 0-9 Units 0-9 Units, Subcutaneous, 3 times daily with meals, CBG < 70: Implement Hypoglycemia Standing Orders and refer to Hypoglycemia Standing Orders sidebar report CBG 70 - 120: 0 units CBG 121 - 150: 1 unit CBG 151 - 200: 2 units CBG 201 - 250: 3 units CBG 251 - 300: 5 units CBG 301 - 350: 7 units CBG 351 - 400: 9 units CBG > 400 (Patient taking differently: Inject 9 Units into the skin in the morning and at bedtime. insulin aspart (novoLOG) injection 0-9 Units 0-9 Units, Subcutaneous, 3 times daily with meals, CBG < 70: Implement Hypoglycemia Standing Orders and refer to Hypoglycemia Standing Orders sidebar report CBG 70 - 120:  0 units CBG 121 - 150: 1 unit CBG 151 - 200: 2 units CBG 201 - 250: 3 units CBG 251 - 300: 5 units CBG 301 - 350: 7 units CBG 351 - 400: 9 units CBG > 400) 15 mL 11 03/28/2019  . insulin degludec (TRESIBA FLEXTOUCH) 100 UNIT/ML SOPN FlexTouch Pen Inject 0.18 mLs (18 Units total) into the skin at bedtime. 15 pen 1 03/28/2019  . losartan (COZAAR) 100 MG tablet Take 1 tablet (100 mg total) by mouth daily. 90 tablet 0 03/28/2019  . metFORMIN (GLUCOPHAGE) 500 MG tablet Take 1 tablet (500 mg total) by mouth 2 (two) times daily with a meal. 180 tablet 1 03/28/2019  . metoprolol tartrate (LOPRESSOR) 50 MG tablet Take 1 tablet (50 mg total) by mouth 2 (two) times daily. 180 tablet 3 03/28/2019  . pantoprazole (PROTONIX) 40 MG tablet Take 1 tablet (40 mg total) by mouth daily. (Patient taking differently: Take 40 mg by mouth daily as needed. ) 30 tablet 5 03/27/2019  . polyethylene glycol (MIRALAX / GLYCOLAX) 17 g packet Take 17 g by mouth daily. (Patient taking differently: Take 17 g by mouth daily as needed. ) 14 each 0 03/25/2019  . potassium chloride SA (KLOR-CON M20) 20 MEQ tablet TAKE 1/2 TABLET (10 MEQ TOTAL) BY MOUTH DAILY. NEED OV. *INS WILL COVER 4/19 OR LATER* 45 tablet 1 03/28/2019  . TRULICITY 1.5 WI/2.0BT SOPN INJECT 1.5 MG INTO THE SKIN ONCE A WEEK. 6 mL 0 03/22/2019  . warfarin (COUMADIN) 5 MG tablet Take 1 tablet (5 mg total) by mouth every evening. (Patient taking differently: Take 5 mg by mouth every evening. 12/2018: directions were "take as directed". 01/2019: directions were "take 1 tab po qd". Patient has been taking 1 tablet every other day, alternating 1.5 tabs on the other days.) 30 tablet 3 03/28/2019  . Insulin Pen Needle 32G X 4 MM MISC Use to give insulin daily Dx E11.29 (Patient not taking: Reported on 03/30/2019) 100 each 3 Not Taking at Unknown time  . polyvinyl alcohol (LIQUIFILM TEARS) 1.4 % ophthalmic solution Place 1 drop into both eyes as needed for dry eyes. (Patient not taking: Reported  on 03/30/2019) 15 mL 0 Not Taking at Unknown time    Assessment: Pharmacy consulted to dose warfarin in patient with atrial fibrillation.  Patient's home dose listed as alternating 5 mg and 7.5 mg doses.  INR 1.8  Goal of Therapy:  INR 2-3 Monitor platelets by anticoagulation protocol: Yes   Plan:  Warfarin 7.5 mg x 1 dose. Monitor daily INR and s/s of bleeding.  Margot Ables, PharmD Clinical Pharmacist 04/04/2019 7:59 AM

## 2019-04-04 NOTE — Progress Notes (Signed)
PROGRESS NOTE [facility]   [facility]  GGY:[id number]  DOB: [birth year]  DOA: 03/28/2019 PCP: [name], [name], MD   Brief Admission Hx: 69 year old male with an ICM, diabetes mellitus type 2, hypertension, CKD, chronic A. fib on anticoagulation, history of colon cancer, MGUS presented with progressive weight gain fluid retention and was markedly volume overloaded with DOE admitted for IV diuresis after failing oral diuretics as outpatient management.  MDM/Assessment & Plan:   1. Heart failure preserved EF-he has known nonischemic cardiomyopathy and right heart failure with severe pulmonary hypertension.  He presented with anasarca.  He remains markedly volume overloaded but slowly improving.  Cardiology has seen him and started him on an IV Lasix infusion.  He is responding well to this therapy.  He has diuresed better on the IV Lasix infusion.  He was given a dose of metolazone on 04/02/19.   His creatinine is slightly higher today and cardiology has reduced lasix infusion to 73m per hour.  They will continue the IV Lasix infusion as he seems to be responding.   2. Acute on chronic respiratory failure with hypoxia-secondary to diastolic CHF exacerbation-he is improving slowly with IV diuresis he remains markedly volume overloaded and will need ongoing diuresis. 3. Stage IIIa CKD-his creatinine has slightly worsened and now greater than 2.  We are holding losartan and Metformin from home use.  Continue to monitor closely in the setting of IV diuresis.  We are working hard to diurese him and tolerating a slight bump in creatinine.  We may need to get nephrology involved if he continues to have elevation of creatinine. 4. Permanent atrial fibrillation-he is on warfarin for full anticoagulation and pharmacy is working to adjust his warfarin doses in the hospital, monitoring PT/INR daily.  His rate is well controlled on metoprolol 50 mg twice a day and Cardizem CD. 5. Type 2 diabetes  mellitus-Metformin discontinued, Trulicity on hold, he is on Lantus and NovoLog sliding scale with frequent CBG monitoring.  DVT prophylaxis: Warfarin, SCDs Code Status: Full Family Communication: Patient briefed at bedside on plan of care and verbalizes understanding Disposition Plan: Continue IV Lasix infusion for diuresis as he remains markedly volume overloaded and will continue to need diuresis  Consultants:  Heart care cardiology  Procedures:    Antimicrobials:     Subjective: Patient says his legs are less swollen today.  Objective: Vitals:   04/03/19 1344 04/03/19 2027 04/03/19 2111 04/04/19 1000  BP: 111/70  112/67   Pulse: 63  67   Resp: 18  18   Temp: 97.8 F (36.6 C)  98.5 F (36.9 C)   TempSrc: Oral  Oral   SpO2: 92% 93% 97%   Weight:    129 kg  Height:        Intake/Output Summary (Last 24 hours) at 04/04/2019 1322 Last data filed at 04/04/2019 0940 Gross per 24 hour  Intake 647.09 ml  Output 1100 ml  Net -452.91 ml   Filed Weights   04/02/19 0554 04/03/19 0718 04/04/19 1000  Weight: 129.7 kg 130.6 kg 129 kg   REVIEW OF SYSTEMS  As per history otherwise all reviewed and reported negative  Exam:  General exam: Morbidly obese male sitting in bed, awake and alert Respiratory system: Bibasilar crackles. No increased work of breathing. Cardiovascular system: S1 & S2 heard. No JVD, murmurs, gallops, clicks or pedal edema. Gastrointestinal system: Abdomen is largely distended positive fluid wave, soft and nontender. Normal bowel sounds heard. Central nervous system:  Alert and oriented. No focal neurological deficits. Extremities: 2+ pitting edema bilateral lower extremities  Data Reviewed: Basic Metabolic Panel: Recent Labs  Lab 03/31/19 0530 04/01/19 0855 04/02/19 0800 04/03/19 0435 04/04/19 0448  NA 140 138 136 135 138  K 4.7 4.6 4.9 4.6 4.3  CL 103 100 101 99 97*  CO2 29 30 25 28 29   GLUCOSE 123* 124* 165* 133* 113*  BUN 56* 61* 65* 73*  75*  CREATININE 1.81* 1.93* 2.04* 2.13* 2.26*  CALCIUM 8.9 9.0 8.6* 8.6* 9.1  MG 2.2  --   --   --  2.6*  PHOS  --   --  5.2*  --   --    Liver Function Tests: Recent Labs  Lab 04/02/19 0800  ALBUMIN 3.4*   No results for input(s): LIPASE, AMYLASE in the last 168 hours. No results for input(s): AMMONIA in the last 168 hours. CBC: Recent Labs  Lab 03/29/19 0446 04/01/19 0855 04/03/19 0435  WBC 5.1 5.0 5.4  HGB 9.3* 9.6* 9.2*  HCT 33.7* 36.1* 34.4*  MCV 91.3 90.9 90.3  PLT 198 206 212   Cardiac Enzymes: No results for input(s): CKTOTAL, CKMB, CKMBINDEX, TROPONINI in the last 168 hours. CBG (last 3)  Recent Labs    04/03/19 1607 04/04/19 0748 04/04/19 1214  GLUCAP 122* 108* 158*   Recent Results (from the past 240 hour(s))  SARS CORONAVIRUS 2 (TAT 6-24 HRS) Nasopharyngeal Nasopharyngeal Swab     Status: None   Collection Time: 03/28/19  3:07 PM   Specimen: Nasopharyngeal Swab  Result Value Ref Range Status   SARS Coronavirus 2 NEGATIVE NEGATIVE Final    Comment: (NOTE) SARS-CoV-2 target nucleic acids are NOT DETECTED. The SARS-CoV-2 RNA is generally detectable in upper and lower respiratory specimens during the acute phase of infection. Negative results do not preclude SARS-CoV-2 infection, do not rule out co-infections with other pathogens, and should not be used as the sole basis for treatment or other patient management decisions. Negative results must be combined with clinical observations, patient history, and epidemiological information. The expected result is Negative. Fact Sheet for Patients: SugarRoll.be Fact Sheet for Healthcare Providers: https://www.woods-mathews.com/ This test is not yet approved or cleared by the [place] FDA and  has been authorized for detection and/or diagnosis of SARS-CoV-2 by FDA under an Emergency Use Authorization (EUA). This EUA will remain  in effect (meaning this test can be  used) for the duration of the COVID-19 declaration under Section 56 4(b)(1) of the Act, 21 U.S.C. section 360bbb-3(b)(1), unless the authorization is terminated or revoked sooner. Performed at [facility], [place] [address] [place], [place] [address]      Studies: No results found.  Scheduled Meds: . allopurinol  300 mg Oral Daily  . atorvastatin  20 mg Oral QPM  . benzonatate  100 mg Oral BID  . diltiazem  180 mg Oral Daily  . ferrous sulfate  325 mg Oral Q breakfast  . insulin aspart  0-15 Units Subcutaneous TID WC  . insulin aspart  0-5 Units Subcutaneous QHS  . insulin glargine  18 Units Subcutaneous QHS  . metoprolol tartrate  50 mg Oral BID  . pantoprazole  40 mg Oral Daily  . polyethylene glycol  17 g Oral Daily  . potassium chloride SA  20 mEq Oral Daily  . sodium chloride flush  3 mL Intravenous Q12H  . warfarin  7.5 mg Oral ONCE-1600  . Warfarin - Pharmacist Dosing Inpatient  Does not apply q1600   Continuous Infusions: . sodium chloride    . furosemide (LASIX) infusion 4 mg/hr (04/04/19 0939)    Principal Problem:   Acute on chronic heart failure with preserved ejection fraction (HFpEF) (HCC) Active Problems:   Hypertensive heart disease   Morbid obesity (HCC)   OSA (obstructive sleep apnea)   Nonischemic cardiomyopathy (HCC)   Anticoagulated on Coumadin   Atrial fibrillation with slow ventricular response (HCC)   AKI (acute kidney injury) ([place])   DM (diabetes mellitus), type 2, uncontrolled, with renal complications ([place])   CKD stage 3 due to type 2 diabetes mellitus ([place])   Pulmonary hypertension ([place])   Acute exacerbation of CHF (congestive heart failure) ([place])   Bilateral leg edema   Chronic renal failure  Time spent:   Irwin Brakeman, MD [facility] 04/04/2019, 1:22 PM    LOS: 6 days  How to contact the [facility] Attending or Consulting provider [place] or covering provider during after hours 7P -7A, for this patient?  1. Check the  care team in [facility] and look for a) attending/consulting TR[facility] provider listed and b) the [facility] team listed 2. Log into [email or web address] and use [place]'s universal password to access. If you do not have the password, please contact the hospital operator. 3. Locate the [facility] provider you are looking for under [facility] and page to a number that you can be directly reached. 4. If you still have difficulty reaching the provider, please page the [facility] (Director on Call) for the Hospitalists listed on amion for assistance.

## 2019-04-04 NOTE — Progress Notes (Signed)
Progress Note  Patient Name: [facility] Date of Encounter: 04/04/2019  Primary Cardiologist: Minus Breeding, MD   Subjective   He feels his scrotal edema, leg swelling, and abdominal distention have been improving over the last few days. He said he can move around without as much shortness of breath as well.  Inpatient Medications    Scheduled Meds: . allopurinol  300 mg Oral Daily  . atorvastatin  20 mg Oral QPM  . benzonatate  100 mg Oral BID  . diltiazem  180 mg Oral Daily  . ferrous sulfate  325 mg Oral Q breakfast  . insulin aspart  0-15 Units Subcutaneous TID WC  . insulin aspart  0-5 Units Subcutaneous QHS  . insulin glargine  18 Units Subcutaneous QHS  . metoprolol tartrate  50 mg Oral BID  . pantoprazole  40 mg Oral Daily  . polyethylene glycol  17 g Oral Daily  . potassium chloride SA  20 mEq Oral Daily  . sodium chloride flush  3 mL Intravenous Q12H  . warfarin  7.5 mg Oral ONCE-1600  . Warfarin - Pharmacist Dosing Inpatient   Does not apply q1600   Continuous Infusions: . sodium chloride    . furosemide (LASIX) infusion 4 mg/hr (04/04/19 0939)   PRN Meds: sodium chloride, acetaminophen **OR** acetaminophen, acetaminophen, guaiFENesin-dextromethorphan, ondansetron **OR** ondansetron (ZOFRAN) IV, polyethylene glycol, polyvinyl alcohol, sodium chloride flush, traZODone   Vital Signs    Vitals:   04/03/19 0718 04/03/19 1344 04/03/19 2027 04/03/19 2111  BP:  111/70  112/67  Pulse:  63  67  Resp:  18  18  Temp:  97.8 F (36.6 C)  98.5 F (36.9 C)  TempSrc:  Oral  Oral  SpO2:  92% 93% 97%  Weight: 130.6 kg     Height:        Intake/Output Summary (Last 24 hours) at 04/04/2019 0945 Last data filed at 04/04/2019 0940 Gross per 24 hour  Intake 647.09 ml  Output 1100 ml  Net -452.91 ml   Last 3 Weights 04/03/2019 04/02/2019 04/01/2019  Weight (lbs) 288 lb 286 lb 287 lb 3.2 oz  Weight (kg) 130.636 kg 129.729 kg 130.273 kg        Physical Exam   GEN:  No acute distress.   Neck: No JVD Cardiac:  Irregular rhythm, regular rate, normal S1/S2, no S3, soft systolic murmur along left sternal border Respiratory:  Diminished breath sounds throughout with no obvious wheeze or crackles. GI:  Firm, distended.  Scrotal edema also noted. MS:  2+ pitting bilateral lower extremity edema; No deformity. Neuro:  Nonfocal  Psych: Normal affect   Labs    High Sensitivity Troponin:  No results for input(s): TROPONINIHS in the last 720 hours.    Chemistry Recent Labs  Lab 04/02/19 0800 04/03/19 0435 04/04/19 0448  NA 136 135 138  K 4.9 4.6 4.3  CL 101 99 97*  CO2 25 28 29   GLUCOSE 165* 133* 113*  BUN 65* 73* 75*  CREATININE 2.04* 2.13* 2.26*  CALCIUM 8.6* 8.6* 9.1  ALBUMIN 3.4*  --   --   GFRNONAA 33* 31* 29*  GFRAA 38* 36* 34*  ANIONGAP 10 8 12      Hematology Recent Labs  Lab 03/29/19 0446 04/01/19 0855 04/03/19 0435  WBC 5.1 5.0 5.4  RBC 3.69* 3.97* 3.81*  HGB 9.3* 9.6* 9.2*  HCT 33.7* 36.1* 34.4*  MCV 91.3 90.9 90.3  MCH 25.2* 24.2* 24.1*  MCHC 27.6* 26.6* 26.7*  RDW 20.0* 19.9* 20.1*  PLT 198 206 212    BNP No results for input(s): BNP, PROBNP in the last 168 hours.   DDimer No results for input(s): DDIMER in the last 168 hours.   Radiology    No results found.  Cardiac Studies   Echocardiogram 04/01/2019:  1. Patient has evidence for significant right sided heart failure.  2. Left ventricular ejection fraction, by estimation, is 55 to 60%. The  left ventricle has normal function. The left ventricle has no regional  wall motion abnormalities. Left ventricular diastolic parameters were  normal.  3. Right ventricular systolic function is severely reduced. The right  ventricular size is severely enlarged. There is severely elevated  pulmonary artery systolic pressure.  4. Left atrial size was moderately dilated.  5. The mitral valve is normal in structure. Trivial mitral valve  regurgitation. No evidence of  mitral stenosis.  6. The aortic valve is normal in structure. Aortic valve regurgitation is  not visualized. Mild to moderate aortic valve sclerosis/calcification is  present, without any evidence of aortic stenosis.  7. The inferior vena cava is dilated in size with <50% respiratory  variability, suggesting right atrial pressure of 15 mmHg.   Patient Profile     69 y.o. male with a hx  of chronic atrial fibrillation, diastolic CHF, and episodes of AKI who is being seen today for the evaluation of CHF at the request of Dr. Denton Brick.  Assessment & Plan    1.  Acute on chronic diastolic heart failure/right heart failure with anasarca: LVEF 55 to 60% by most recent echocardiogram with severe right ventricular enlargement and severely reduced RV systolic function with severe pulmonary hypertension.  IVC dilatation noted with estimated RAP of 15 mmHg.  Severe pulmonary hypertension and RV failure likely secondary to combination of morbid obesity and untreated obstructive sleep apnea/obesity hypoventilation syndrome.   He appears to be volume overloaded but is improving. Weight is pending this morning.   He put out 1.1 L in last 24 hrs (nurse measured while I was in room). As renal function has slightly worsened compared to yesterday, I will reduce Lasix infusion to 4 mg/hr.  2.  History of nonischemic cardiomyopathy: LVEF 55 to 60% echocardiogram on 04/01/2019.  Felt to be tachycardia mediated.  Cardiac catheterization in 2008 showed no significant coronary artery disease.  3.  Permanent atrial fibrillation: Heart rate controlled on Cardizem CD 180 mg daily and Lopressor 50 mg twice daily.  Systemically anticoagulated with warfarin.  4.  Acute on chronic CKD stage III: As renal function has slightly worsened compared to yesterday, I will reduce Lasix infusion to 4 mg/hr.  5.  Obstructive sleep apnea: Refuses CPAP.  Apparently getting new equipment at home.  6.  Hypertension: Blood pressure is  normal.   For questions or updates, please contact [place] Please consult [email or web address] for contact info under        Signed, Kate Sable, MD  04/04/2019, 9:45 AM

## 2019-04-05 LAB — BASIC METABOLIC PANEL
Anion gap: 10 (ref 5–15)
BUN: 77 mg/dL — ABNORMAL HIGH (ref 8–23)
CO2: 30 mmol/L (ref 22–32)
Calcium: 8.8 mg/dL — ABNORMAL LOW (ref 8.9–10.3)
Chloride: 98 mmol/L (ref 98–111)
Creatinine, Ser: 2.28 mg/dL — ABNORMAL HIGH (ref 0.61–1.24)
GFR calc Af Amer: 33 mL/min — ABNORMAL LOW (ref >60)
GFR calc non Af Amer: 29 mL/min — ABNORMAL LOW (ref >60)
Glucose, Bld: 156 mg/dL — ABNORMAL HIGH (ref 70–99)
Potassium: 4.1 mmol/L (ref 3.5–5.1)
Sodium: 138 mmol/L (ref 135–145)

## 2019-04-05 LAB — GLUCOSE, CAPILLARY
Glucose-Capillary: 132 mg/dL — ABNORMAL HIGH (ref 70–99)
Glucose-Capillary: 117 mg/dL — ABNORMAL HIGH (ref 70–99)
Glucose-Capillary: 136 mg/dL — ABNORMAL HIGH (ref 70–99)
Glucose-Capillary: 141 mg/dL — ABNORMAL HIGH (ref 70–99)

## 2019-04-05 LAB — PROTIME-INR
INR: 2.2 — ABNORMAL HIGH (ref 0.8–1.2)
Prothrombin Time: 24.2 seconds — ABNORMAL HIGH (ref 11.4–15.2)

## 2019-04-05 LAB — MAGNESIUM: Magnesium: 2.4 mg/dL (ref 1.7–2.4)

## 2019-04-05 MED ORDER — WARFARIN SODIUM 5 MG PO TABS
5.0000 mg | ORAL_TABLET | Freq: Once | ORAL | Status: AC
  Administered 2019-04-05: 5 mg via ORAL
  Filled 2019-04-05: qty 1

## 2019-04-05 NOTE — Progress Notes (Signed)
ANTICOAGULATION CONSULT NOTE -   Pharmacy Consult for warfarin Indication: atrial fibrillation  Allergies  Allergen Reactions  . Codeine     Headache     Patient Measurements: Height: _0  (162.6 cm) Weight: 128.3 kg (282 lb 12.8 oz) IBW/kg (Calculated) : 59.2  Vital Signs: Temp: 98.1 F (36.7 C) (04/02 0518) Temp Source: Oral (04/02 0518) BP: 115/65 (04/02 0518) Pulse Rate: 56 (04/02 0518)  Labs: Recent Labs    04/03/19 0435 04/04/19 0448 04/05/19 0452  HGB 9.2*  --   --   HCT 34.4*  --   --   PLT 212  --   --   LABPROT 21.9* 21.2* 24.2*  INR 1.9* 1.8* 2.2*  CREATININE 2.13* 2.26* 2.28*    Estimated Creatinine Clearance: 38.6 mL/min (A) (by C-G formula based on SCr of 2.28 mg/dL (H)).   Medical History: Past Medical History:  Diagnosis Date  . CAD (coronary artery disease)    LHC 2/08:  mRCA 50%, o/w no sig CAD, EF 25%  . Cardiomyopathy ([place])    EF 35-40% in 2/16 in setting of AF with RVR >> echo 5/16 with improved LVF with EF 65-70%  . Chronic atrial fibrillation ([place])   . Chronic diastolic heart failure (HCC)    HFPF - EF ~65-70% (OFTEN EXACERBATED BY AFIB)  . CKD (chronic kidney disease)    hx of worsening renal failure and hyperK+ in setting of acute diast CHF >> required CVVHD in 4/16 and dialysis x 1 in 5/16  . CKD stage 3 due to type 2 diabetes mellitus ([place]) 09/17/2015  . Colon cancer ([place])   . Colon polyps 09/21/2012   Tubular adenoma  . Diabetes ([place])   . History of echocardiogram    Echo 05/16/14:  mild LVH, vigorous LVF, EF 65-70%, no RWMA, mean AV gradient 9 mmHg, MAC, mild MR, mod LAE, mild RVE, mild reduced RVF, severe RAE  . Hyperlipidemia   . Hypertension   . Obesity hypoventilation syndrome ([place])   . Renal insufficiency   . Sleep apnea     Medications:  Medications Prior to Admission  Medication Sig Dispense Refill Last Dose  . acetaminophen (TYLENOL) 325 MG tablet Take 2 tablets (650 mg total) by mouth every 6 (six) hours as  needed for mild pain (or Fever >/= 101). 12 tablet 0 03/26/2019  . allopurinol (ZYLOPRIM) 300 MG tablet TAKE 1 TABLET BY MOUTH EVERY DAY 90 tablet 0 03/28/2019  . atorvastatin (LIPITOR) 20 MG tablet 1 tablet daily 90 tablet 3 03/28/2019  . azelastine (OPTIVAR) 0.05 % ophthalmic solution PLACE 2 DROPS INTO BOTH EYES 2 (TWO) TIMES DAILY. (Patient taking differently: Place 2 drops into the right eye 2 (two) times daily as needed (dry eye from allergies). ) 6 mL 5 03/28/2019  . benzonatate (TESSALON PERLES) 100 MG capsule Take 1 capsule (100 mg total) by mouth 2 (two) times daily. (Patient taking differently: Take 100 mg by mouth 2 (two) times daily as needed. ) 60 capsule 3 03/28/2019  . DILT-XR 180 MG 24 hr capsule TAKE 1 CAPSULE BY MOUTH EVERY DAY (Patient taking differently: Take 180 mg by mouth in the morning and at bedtime. ) 90 capsule 0 03/28/2019  . FARXIGA 5 MG TABS tablet Take 5 mg by mouth daily.   03/28/2019  . ferrous sulfate 325 (65 FE) MG tablet Take 325 mg by mouth daily with breakfast.    03/28/2019  . furosemide (LASIX) 80 MG tablet Take 1 tablet (80 mg  total) by mouth daily. 30 tablet 2 03/28/2019  . insulin aspart (NOVOLOG FLEXPEN) 100 UNIT/ML FlexPen insulin aspart (novoLOG) injection 0-9 Units 0-9 Units, Subcutaneous, 3 times daily with meals, CBG < 70: Implement Hypoglycemia Standing Orders and refer to Hypoglycemia Standing Orders sidebar report CBG 70 - 120: 0 units CBG 121 - 150: 1 unit CBG 151 - 200: 2 units CBG 201 - 250: 3 units CBG 251 - 300: 5 units CBG 301 - 350: 7 units CBG 351 - 400: 9 units CBG > 400 (Patient taking differently: Inject 9 Units into the skin in the morning and at bedtime. insulin aspart (novoLOG) injection 0-9 Units 0-9 Units, Subcutaneous, 3 times daily with meals, CBG < 70: Implement Hypoglycemia Standing Orders and refer to Hypoglycemia Standing Orders sidebar report CBG 70 - 120: 0 units CBG 121 - 150: 1 unit CBG 151 - 200: 2 units CBG 201 - 250: 3 units CBG 251 -  300: 5 units CBG 301 - 350: 7 units CBG 351 - 400: 9 units CBG > 400) 15 mL 11 03/28/2019  . insulin degludec (TRESIBA FLEXTOUCH) 100 UNIT/ML SOPN FlexTouch Pen Inject 0.18 mLs (18 Units total) into the skin at bedtime. 15 pen 1 03/28/2019  . losartan (COZAAR) 100 MG tablet Take 1 tablet (100 mg total) by mouth daily. 90 tablet 0 03/28/2019  . metFORMIN (GLUCOPHAGE) 500 MG tablet Take 1 tablet (500 mg total) by mouth 2 (two) times daily with a meal. 180 tablet 1 03/28/2019  . metoprolol tartrate (LOPRESSOR) 50 MG tablet Take 1 tablet (50 mg total) by mouth 2 (two) times daily. 180 tablet 3 03/28/2019  . pantoprazole (PROTONIX) 40 MG tablet Take 1 tablet (40 mg total) by mouth daily. (Patient taking differently: Take 40 mg by mouth daily as needed. ) 30 tablet 5 03/27/2019  . polyethylene glycol (MIRALAX / GLYCOLAX) 17 g packet Take 17 g by mouth daily. (Patient taking differently: Take 17 g by mouth daily as needed. ) 14 each 0 03/25/2019  . potassium chloride SA (KLOR-CON M20) 20 MEQ tablet TAKE 1/2 TABLET (10 MEQ TOTAL) BY MOUTH DAILY. NEED OV. *INS WILL COVER 4/19 OR LATER* 45 tablet 1 03/28/2019  . TRULICITY 1.5 MQ/2.8MN SOPN INJECT 1.5 MG INTO THE SKIN ONCE A WEEK. 6 mL 0 03/22/2019  . warfarin (COUMADIN) 5 MG tablet Take 1 tablet (5 mg total) by mouth every evening. (Patient taking differently: Take 5 mg by mouth every evening. 12/2018: directions were "take as directed". 01/2019: directions were "take 1 tab po qd". Patient has been taking 1 tablet every other day, alternating 1.5 tabs on the other days.) 30 tablet 3 03/28/2019  . Insulin Pen Needle 32G X 4 MM MISC Use to give insulin daily Dx E11.29 (Patient not taking: Reported on 03/30/2019) 100 each 3 Not Taking at Unknown time  . polyvinyl alcohol (LIQUIFILM TEARS) 1.4 % ophthalmic solution Place 1 drop into both eyes as needed for dry eyes. (Patient not taking: Reported on 03/30/2019) 15 mL 0 Not Taking at Unknown time    Assessment: Pharmacy consulted  to dose warfarin in patient with atrial fibrillation.  Patient's home dose listed as alternating 5 mg and 7.5 mg doses.  INR 1.8>>2.2, therapeutic today  Goal of Therapy:  INR 2-3 Monitor platelets by anticoagulation protocol: Yes   Plan:  Warfarin 5 mg x 1 dose. Monitor daily INR and s/s of bleeding.  Isac Sarna, BS Pharm D, [place] Clinical Pharmacist Pager [phone] 04/05/2019 8:35 AM

## 2019-04-05 NOTE — Care Management Important Message (Signed)
Important Message  Patient Details  Name: [facility] MRN: [id number] Date of Birth: Mar 24, 1950   Medicare Important Message Given:  Yes     [name] 04/05/2019, 11:46 AM

## 2019-04-05 NOTE — Progress Notes (Signed)
PROGRESS NOTE [facility]  William Flores  GMW:[id number]  DOB: 06-27-1950  DOA: 03/28/2019 PCP: [name], [name], MD   Brief Admission Hx: 69 year old male with an ICM, diabetes mellitus type 2, hypertension, CKD, chronic A. fib on anticoagulation, history of colon cancer, MGUS presented with progressive weight gain fluid retention and was markedly volume overloaded with DOE admitted for IV diuresis after failing oral diuretics as outpatient management.  MDM/Assessment & Plan:   1. Heart failure preserved EF-he has known nonischemic cardiomyopathy and right heart failure with severe pulmonary hypertension.  He presented with anasarca.  He remains markedly volume overloaded but slowly improving.  Cardiology has seen him and started him on an IV Lasix infusion.  He is responding well to this therapy.  He has diuresed better on the IV Lasix infusion.  He was given a dose of metolazone on 04/02/19.   Cardiology has reduced lasix infusion to 25m per hour.  They will continue the IV Lasix infusion as he seems to be responding.  Plan to transition to oral torsemide 20 mg BID in next 1-2 days.   2. Acute on chronic respiratory failure with hypoxia-secondary to diastolic CHF exacerbation-he is improving slowly with IV diuresis he remains markedly volume overloaded and will need ongoing diuresis. 3. Stage IIIa CKD-his creatinine has slightly worsened and now greater than 2.  We are holding losartan and Metformin from home use.  Continue to monitor closely in the setting of IV diuresis.  We are working hard to diurese him and tolerating a slight bump in creatinine.  We may need to get nephrology involved if he continues to have elevation of creatinine. 4. Permanent atrial fibrillation-he is on warfarin for full anticoagulation and pharmacy is working to adjust his warfarin doses in the hospital, monitoring PT/INR daily.  His rate is well controlled on metoprolol 50 mg twice a day and Cardizem  CD. 5. Type 2 diabetes mellitus-Metformin discontinued, Trulicity on hold, he is on Lantus and NovoLog sliding scale with frequent CBG monitoring.  DVT prophylaxis: Warfarin, SCDs Code Status: Full Family Communication: Patient briefed at bedside on plan of care and verbalizes understanding Disposition Plan: Continue IV Lasix infusion for diuresis as he remains markedly volume overloaded and will continue to need diuresis, planning planning to transition to oral torsemide 20 mg twice daily prior to discharge  Consultants:  Heart care cardiology  Procedures:    Antimicrobials:     Subjective: Patient says his legs are less swollen today.  Objective: Vitals:   04/05/19 0500 04/05/19 0518 04/05/19 0743 04/05/19 0932  BP:  115/65  116/77  Pulse:  (!) 56  60  Resp:  16  16  Temp:  98.1 F (36.7 C)  98 F (36.7 C)  TempSrc:  Oral  Oral  SpO2:  99% 98% 92%  Weight: 128.3 kg     Height:        Intake/Output Summary (Last 24 hours) at 04/05/2019 1231 Last data filed at 04/05/2019 0940 Gross per 24 hour  Intake 3 ml  Output 501 ml  Net -498 ml   Filed Weights   04/03/19 0718 04/04/19 1000 04/05/19 0500  Weight: 130.6 kg 129 kg 128.3 kg   REVIEW OF SYSTEMS  As per history otherwise all reviewed and reported negative  Exam:  General exam: Morbidly obese male sitting in bed, awake and alert Respiratory system: Bibasilar crackles. No increased work of breathing. Cardiovascular system: S1 & S2 heard. No JVD, murmurs, gallops, clicks or  pedal edema. Gastrointestinal system: Abdomen is largely distended positive fluid wave, soft and nontender. Normal bowel sounds heard. Central nervous system: Alert and oriented. No focal neurological deficits. Extremities: 2+ pitting edema bilateral lower extremities  Data Reviewed: Basic Metabolic Panel: Recent Labs  Lab 03/31/19 0530 03/31/19 0530 04/01/19 0855 04/02/19 0800 04/03/19 0435 04/04/19 0448 04/05/19 0452  NA 140   <  > 138 136 135 138 138  K 4.7   < > 4.6 4.9 4.6 4.3 4.1  CL 103   < > 100 101 99 97* 98  CO2 29   < > 30 25 28 29 30   GLUCOSE 123*   < > 124* 165* 133* 113* 156*  BUN 56*   < > 61* 65* 73* 75* 77*  CREATININE 1.81*   < > 1.93* 2.04* 2.13* 2.26* 2.28*  CALCIUM 8.9   < > 9.0 8.6* 8.6* 9.1 8.8*  MG 2.2  --   --   --   --  2.6* 2.4  PHOS  --   --   --  5.2*  --   --   --    < > = values in this interval not displayed.   Liver Function Tests: Recent Labs  Lab 04/02/19 0800  ALBUMIN 3.4*   No results for input(s): LIPASE, AMYLASE in the last 168 hours. No results for input(s): AMMONIA in the last 168 hours. CBC: Recent Labs  Lab 04/01/19 0855 04/03/19 0435  WBC 5.0 5.4  HGB 9.6* 9.2*  HCT 36.1* 34.4*  MCV 90.9 90.3  PLT 206 212   Cardiac Enzymes: No results for input(s): CKTOTAL, CKMB, CKMBINDEX, TROPONINI in the last 168 hours. CBG (last 3)  Recent Labs    04/04/19 2057 04/05/19 0730 04/05/19 1210  GLUCAP 145* 132* 117*   Recent Results (from the past 240 hour(s))  SARS CORONAVIRUS 2 (TAT 6-24 HRS) Nasopharyngeal Nasopharyngeal Swab     Status: None   Collection Time: 03/28/19  3:07 PM   Specimen: Nasopharyngeal Swab  Result Value Ref Range Status   SARS Coronavirus 2 NEGATIVE NEGATIVE Final    Comment: (NOTE) SARS-CoV-2 target nucleic acids are NOT DETECTED. The SARS-CoV-2 RNA is generally detectable in upper and lower respiratory specimens during the acute phase of infection. Negative results do not preclude SARS-CoV-2 infection, do not rule out co-infections with other pathogens, and should not be used as the sole basis for treatment or other patient management decisions. Negative results must be combined with clinical observations, patient history, and epidemiological information. The expected result is Negative. Fact Sheet for Patients: SugarRoll.be Fact Sheet for Healthcare  Providers: https://www.woods-mathews.com/ This test is not yet approved or cleared by the [place] FDA and  has been authorized for detection and/or diagnosis of SARS-CoV-2 by FDA under an Emergency Use Authorization (EUA). This EUA will remain  in effect (meaning this test can be used) for the duration of the COVID-19 declaration under Section 56 4(b)(1) of the Act, 21 U.S.C. section 360bbb-3(b)(1), unless the authorization is terminated or revoked sooner. Performed at [facility], [place] [address], [place], [place] [address]     Studies: No results found.  Scheduled Meds: . allopurinol  300 mg Oral Daily  . atorvastatin  20 mg Oral QPM  . benzonatate  100 mg Oral BID  . diltiazem  180 mg Oral Daily  . ferrous sulfate  325 mg Oral Q breakfast  . insulin aspart  0-15 Units Subcutaneous TID WC  . insulin  aspart  0-5 Units Subcutaneous QHS  . insulin glargine  18 Units Subcutaneous QHS  . metoprolol tartrate  50 mg Oral BID  . pantoprazole  40 mg Oral Daily  . polyethylene glycol  17 g Oral Daily  . potassium chloride SA  20 mEq Oral Daily  . sodium chloride flush  3 mL Intravenous Q12H  . warfarin  5 mg Oral ONCE-1600  . Warfarin - Pharmacist Dosing Inpatient   Does not apply q1600   Continuous Infusions: . sodium chloride    . furosemide (LASIX) infusion 4 mg/hr (04/04/19 0939)    Principal Problem:   Acute on chronic heart failure with preserved ejection fraction (HFpEF) (HCC) Active Problems:   Hypertensive heart disease   Morbid obesity (HCC)   OSA (obstructive sleep apnea)   Nonischemic cardiomyopathy (HCC)   Anticoagulated on Coumadin   Atrial fibrillation with slow ventricular response (HCC)   AKI (acute kidney injury) ([place])   DM (diabetes mellitus), type 2, uncontrolled, with renal complications ([place])   CKD stage 3 due to type 2 diabetes mellitus ([place])   Pulmonary hypertension ([place])   Acute systolic congestive heart failure (HCC)   Acute  exacerbation of CHF (congestive heart failure) ([place])   Bilateral leg edema   Chronic renal failure  Time spent:   Irwin Brakeman, MD [facility] 04/05/2019, 12:31 PM    LOS: 7 days  How to contact the [facility] Attending or Consulting provider [place] or covering provider during after hours [place], for this patient?  1. Check the care team in [facility] and look for a) attending/consulting TRH provider listed and b) the [facility] team listed 2. Log into [email or web address] and use [place]'s universal password to access. If you do not have the password, please contact the hospital operator. 3. Locate the [facility] provider you are looking for under [facility] and page to a number that you can be directly reached. 4. If you still have difficulty reaching the provider, please page the [facility] (Director on Call) for the Hospitalists listed on amion for assistance.

## 2019-04-05 NOTE — Plan of Care (Signed)

## 2019-04-05 NOTE — Progress Notes (Signed)
Progress Note  Patient Name: William Flores Date of Encounter: 04/05/2019  Primary Cardiologist: Minus Breeding, MD   Subjective   Told me his weights fluctuate and generally are in the 270-280 lb range. He feels better with respect to swelling when compared to yesterday.  Inpatient Medications    Scheduled Meds: . allopurinol  300 mg Oral Daily  . atorvastatin  20 mg Oral QPM  . benzonatate  100 mg Oral BID  . diltiazem  180 mg Oral Daily  . ferrous sulfate  325 mg Oral Q breakfast  . insulin aspart  0-15 Units Subcutaneous TID WC  . insulin aspart  0-5 Units Subcutaneous QHS  . insulin glargine  18 Units Subcutaneous QHS  . metoprolol tartrate  50 mg Oral BID  . pantoprazole  40 mg Oral Daily  . polyethylene glycol  17 g Oral Daily  . potassium chloride SA  20 mEq Oral Daily  . sodium chloride flush  3 mL Intravenous Q12H  . warfarin  5 mg Oral ONCE-1600  . Warfarin - Pharmacist Dosing Inpatient   Does not apply q1600   Continuous Infusions: . sodium chloride    . furosemide (LASIX) infusion 4 mg/hr (04/04/19 0939)   PRN Meds: sodium chloride, acetaminophen **OR** acetaminophen, acetaminophen, guaiFENesin-dextromethorphan, ondansetron **OR** ondansetron (ZOFRAN) IV, polyethylene glycol, polyvinyl alcohol, sodium chloride flush, traZODone   Vital Signs    Vitals:   04/04/19 2056 04/05/19 0500 04/05/19 0518 04/05/19 0743  BP: 114/64  115/65   Pulse: 72  (!) 56   Resp: 17  16   Temp: 98 F (36.7 C)  98.1 F (36.7 C)   TempSrc: Oral  Oral   SpO2: 91%  99% 98%  Weight:  128.3 kg    Height:        Intake/Output Summary (Last 24 hours) at 04/05/2019 0837 Last data filed at 04/04/2019 2300 Gross per 24 hour  Intake 3 ml  Output 1601 ml  Net -1598 ml   Last 3 Weights 04/05/2019 04/04/2019 04/03/2019  Weight (lbs) 282 lb 12.8 oz 284 lb 6.4 oz 288 lb  Weight (kg) 128.277 kg 129.003 kg 130.636 kg        Physical Exam   GEN: No acute distress.   Neck: No  JVD Cardiac:  Irregular rhythm, regular rate, normal S1/S2, no S3, soft systolic murmur along left sternal border Respiratory:  Diminished breath sounds throughout with no obvious wheeze or crackles. GI:  Firm, distended.  Scrotal edema also noted (improved). MS:  1+ pitting bilateral lower extremity edema (improved); No deformity. Neuro:  Nonfocal  Psych: Normal affect   Labs    High Sensitivity Troponin:  No results for input(s): TROPONINIHS in the last 720 hours.    Chemistry Recent Labs  Lab 04/02/19 0800 04/02/19 0800 04/03/19 0435 04/04/19 0448 04/05/19 0452  NA 136   < > 135 138 138  K 4.9   < > 4.6 4.3 4.1  CL 101   < > 99 97* 98  CO2 25   < > 28 29 30   GLUCOSE 165*   < > 133* 113* 156*  BUN 65*   < > 73* 75* 77*  CREATININE 2.04*   < > 2.13* 2.26* 2.28*  CALCIUM 8.6*   < > 8.6* 9.1 8.8*  ALBUMIN 3.4*  --   --   --   --   GFRNONAA 33*   < > 31* 29* 29*  GFRAA 38*   < > 36*  34* 33*  ANIONGAP 10   < > 8 12 10    < > = values in this interval not displayed.     Hematology Recent Labs  Lab 04/01/19 0855 04/03/19 0435  WBC 5.0 5.4  RBC 3.97* 3.81*  HGB 9.6* 9.2*  HCT 36.1* 34.4*  MCV 90.9 90.3  MCH 24.2* 24.1*  MCHC 26.6* 26.7*  RDW 19.9* 20.1*  PLT 206 212    BNP No results for input(s): BNP, PROBNP in the last 168 hours.   DDimer No results for input(s): DDIMER in the last 168 hours.   Radiology    No results found.  Cardiac Studies   Echocardiogram 04/01/2019:  1. Patient has evidence for significant right sided heart failure.  2. Left ventricular ejection fraction, by estimation, is 55 to 60%. The  left ventricle has normal function. The left ventricle has no regional  wall motion abnormalities. Left ventricular diastolic parameters were  normal.  3. Right ventricular systolic function is severely reduced. The right  ventricular size is severely enlarged. There is severely elevated  pulmonary artery systolic pressure.  4. Left atrial  size was moderately dilated.  5. The mitral valve is normal in structure. Trivial mitral valve  regurgitation. No evidence of mitral stenosis.  6. The aortic valve is normal in structure. Aortic valve regurgitation is  not visualized. Mild to moderate aortic valve sclerosis/calcification is  present, without any evidence of aortic stenosis.  7. The inferior vena cava is dilated in size with <50% respiratory  variability, suggesting right atrial pressure of 15 mmHg.   Patient Profile     69 y.o. male with a hx  of chronic atrial fibrillation, diastolic CHF, and episodes of AKI who is being seen today for the evaluation of CHF at the request of Dr. Denton Brick.  Assessment & Plan    1.  Acute on chronic diastolic heart failure/right heart failure with anasarca: LVEF 55 to 60% by most recent echocardiogram with severe right ventricular enlargement and severely reduced RV systolic function with severe pulmonary hypertension.  IVC dilatation noted with estimated RAP of 15 mmHg.  Severe pulmonary hypertension and RV failure likely secondary to combination of morbid obesity and untreated obstructive sleep apnea/obesity hypoventilation syndrome.   He appears to be volume overloaded but is improving. Weight is pending this morning.   He put out nearly 1.6 L in last 24 hrs. I will continue Lasix infusion to 4 mg/hr and will need to allow some degree of renal dysfunction in order to adequately diurese. Could consider switching to torsemide 20 mg bid prior to discharge. He says weights are typically in the 270-280 range. His weight gain at time of presentation likely due to both decompensated HF along with increased caloric intake.  2.  History of nonischemic cardiomyopathy: LVEF 55 to 60% echocardiogram on 04/01/2019.  Felt to be tachycardia mediated.  Cardiac catheterization in 2008 showed no significant coronary artery disease.  3.  Permanent atrial fibrillation: Heart rate controlled on Cardizem CD 180 mg  daily and Lopressor 50 mg twice daily.  Systemically anticoagulated with warfarin (dosed by pharmacy).  4.  Acute on chronic CKD stage III: I will continue Lasix infusion to 4 mg/hr and will need to allow some degree of renal dysfunction in order to adequately diurese.  5.  Obstructive sleep apnea: Refuses CPAP.  Apparently getting new equipment at home.  6.  Hypertension: Blood pressure is normal.   For questions or updates, please contact [place] Please  consult [email or web address] for contact info under        Signed, Kate Sable, MD  04/05/2019, 8:37 AM

## 2019-04-06 LAB — BASIC METABOLIC PANEL
Anion gap: 10 (ref 5–15)
BUN: 81 mg/dL — ABNORMAL HIGH (ref 8–23)
CO2: 32 mmol/L (ref 22–32)
Calcium: 9 mg/dL (ref 8.9–10.3)
Chloride: 96 mmol/L — ABNORMAL LOW (ref 98–111)
Creatinine, Ser: 2.25 mg/dL — ABNORMAL HIGH (ref 0.61–1.24)
GFR calc Af Amer: 34 mL/min — ABNORMAL LOW (ref >60)
GFR calc non Af Amer: 29 mL/min — ABNORMAL LOW (ref >60)
Glucose, Bld: 134 mg/dL — ABNORMAL HIGH (ref 70–99)
Potassium: 4 mmol/L (ref 3.5–5.1)
Sodium: 138 mmol/L (ref 135–145)

## 2019-04-06 LAB — PROTIME-INR
INR: 2.1 — ABNORMAL HIGH (ref 0.8–1.2)
Prothrombin Time: 23.8 seconds — ABNORMAL HIGH (ref 11.4–15.2)

## 2019-04-06 LAB — GLUCOSE, CAPILLARY
Glucose-Capillary: 120 mg/dL — ABNORMAL HIGH (ref 70–99)
Glucose-Capillary: 141 mg/dL — ABNORMAL HIGH (ref 70–99)
Glucose-Capillary: 153 mg/dL — ABNORMAL HIGH (ref 70–99)
Glucose-Capillary: 185 mg/dL — ABNORMAL HIGH (ref 70–99)

## 2019-04-06 MED ORDER — WARFARIN SODIUM 7.5 MG PO TABS
7.5000 mg | ORAL_TABLET | Freq: Once | ORAL | Status: AC
  Administered 2019-04-06: 7.5 mg via ORAL
  Filled 2019-04-06: qty 1

## 2019-04-06 MED ORDER — TORSEMIDE 20 MG PO TABS
20.0000 mg | ORAL_TABLET | Freq: Two times a day (BID) | ORAL | Status: DC
  Administered 2019-04-06 (×2): 20 mg via ORAL
  Filled 2019-04-06 (×3): qty 1

## 2019-04-06 NOTE — Progress Notes (Signed)
Does not wear CPAP

## 2019-04-06 NOTE — Progress Notes (Signed)
PROGRESS NOTE [facility]  William Flores  HQR:[id number]  DOB: 11-Mar-1950  DOA: 03/28/2019 PCP: [name], [name], MD   Brief Admission Hx: 69 year old male with an ICM, diabetes mellitus type 2, hypertension, CKD, chronic A. fib on anticoagulation, history of colon cancer, MGUS presented with progressive weight gain fluid retention and was markedly volume overloaded with DOE admitted for IV diuresis after failing oral diuretics as outpatient management.  MDM/Assessment & Plan:   1. Heart failure preserved EF-he has known nonischemic cardiomyopathy and right heart failure with severe pulmonary hypertension.  He presented with anasarca.  He was volume overloaded but slowly improving.   He is getting closer to his reported baseline weight of 280.   Cardiology has seen him and started him on an IV Lasix infusion.  He is responding well to this therapy.  He has diuresed better on the IV Lasix infusion.  He was given a dose of metolazone on 04/02/19.   Now that he is down to 280 pounds I will transition to torsemide 20 mg BID.    2. Acute on chronic respiratory failure with hypoxia-secondary to diastolic CHF exacerbation-he is improving slowly  he remains volume overloaded and transitioning off IV lasix infusion to oral torsemide 20 mg BID.   3. Stage IIIa CKD-creatinine has remained stable in last couple of days.   We are holding losartan and Metformin from home use.    We are tolerating a slight bump in creatinine with his acute need for diuresis. 4. Permanent atrial fibrillation-he is on warfarin for full anticoagulation and pharmacy is working to adjust his warfarin doses in the hospital, monitoring PT/INR daily.  His rate is well controlled on metoprolol 50 mg twice a day and Cardizem CD. 5. Type 2 diabetes mellitus-Metformin discontinued, Trulicity on hold, he is on Lantus and NovoLog sliding scale with frequent CBG monitoring.  DVT prophylaxis: Warfarin, SCDs Code Status: Full Family  Communication: Patient briefed at bedside on plan of care and verbalizes understanding Disposition Plan: Continue IV Lasix infusion for diuresis as he remains markedly volume overloaded and will continue to need diuresis, planning planning to transition to oral torsemide 20 mg twice daily prior to discharge  Consultants:  Heart care cardiology  Procedures:    Antimicrobials:     Subjective: Patient says his legs are less swollen today.  Objective: Vitals:   04/05/19 1438 04/05/19 2031 04/06/19 0300 04/06/19 0449  BP: 111/69 121/76  115/71  Pulse: 66 68  62  Resp: 16 16  17   Temp: 98.1 F (36.7 C) 98.3 F (36.8 C)  98 F (36.7 C)  TempSrc: Oral Oral  Oral  SpO2: 92% 96%  97%  Weight:   127.7 kg   Height:        Intake/Output Summary (Last 24 hours) at 04/06/2019 1136 Last data filed at 04/06/2019 0040 Gross per 24 hour  Intake 960 ml  Output 800 ml  Net 160 ml   Filed Weights   04/04/19 1000 04/05/19 0500 04/06/19 0300  Weight: 129 kg 128.3 kg 127.7 kg   REVIEW OF SYSTEMS  As per history otherwise all reviewed and reported negative  Exam:  General exam: Morbidly obese male sitting in bed, awake and alert Respiratory system: Bibasilar crackles. No increased work of breathing. Cardiovascular system: S1 & S2 heard. No JVD, murmurs, gallops, clicks or pedal edema. Gastrointestinal system: Abdomen is largely distended positive fluid wave, soft and nontender. Normal bowel sounds heard. Central nervous system: Alert and oriented.  No focal neurological deficits. Extremities: 1+ pitting edema bilateral lower extremities  Data Reviewed: Basic Metabolic Panel: Recent Labs  Lab 03/31/19 0530 04/01/19 0855 04/02/19 0800 04/03/19 0435 04/04/19 0448 04/05/19 0452 04/06/19 0552  NA 140   < > 136 135 138 138 138  K 4.7   < > 4.9 4.6 4.3 4.1 4.0  CL 103   < > 101 99 97* 98 96*  CO2 29   < > 25 28 29 30  32  GLUCOSE 123*   < > 165* 133* 113* 156* 134*  BUN 56*   < >  65* 73* 75* 77* 81*  CREATININE 1.81*   < > 2.04* 2.13* 2.26* 2.28* 2.25*  CALCIUM 8.9   < > 8.6* 8.6* 9.1 8.8* 9.0  MG 2.2  --   --   --  2.6* 2.4  --   PHOS  --   --  5.2*  --   --   --   --    < > = values in this interval not displayed.   Liver Function Tests: Recent Labs  Lab 04/02/19 0800  ALBUMIN 3.4*   No results for input(s): LIPASE, AMYLASE in the last 168 hours. No results for input(s): AMMONIA in the last 168 hours. CBC: Recent Labs  Lab 04/01/19 0855 04/03/19 0435  WBC 5.0 5.4  HGB 9.6* 9.2*  HCT 36.1* 34.4*  MCV 90.9 90.3  PLT 206 212   Cardiac Enzymes: No results for input(s): CKTOTAL, CKMB, CKMBINDEX, TROPONINI in the last 168 hours. CBG (last 3)  Recent Labs    04/05/19 1636 04/05/19 2033 04/06/19 0749  GLUCAP 136* 141* 120*   Recent Results (from the past 240 hour(s))  SARS CORONAVIRUS 2 (TAT 6-24 HRS) Nasopharyngeal Nasopharyngeal Swab     Status: None   Collection Time: 03/28/19  3:07 PM   Specimen: Nasopharyngeal Swab  Result Value Ref Range Status   SARS Coronavirus 2 NEGATIVE NEGATIVE Final    Comment: (NOTE) SARS-CoV-2 target nucleic acids are NOT DETECTED. The SARS-CoV-2 RNA is generally detectable in upper and lower respiratory specimens during the acute phase of infection. Negative results do not preclude SARS-CoV-2 infection, do not rule out co-infections with other pathogens, and should not be used as the sole basis for treatment or other patient management decisions. Negative results must be combined with clinical observations, patient history, and epidemiological information. The expected result is Negative. Fact Sheet for Patients: SugarRoll.be Fact Sheet for Healthcare Providers: https://www.woods-mathews.com/ This test is not yet approved or cleared by the [place] FDA and  has been authorized for detection and/or diagnosis of SARS-CoV-2 by FDA under an Emergency Use Authorization  (EUA). This EUA will remain  in effect (meaning this test can be used) for the duration of the COVID-19 declaration under Section 56 4(b)(1) of the Act, 21 U.S.C. section 360bbb-3(b)(1), unless the authorization is terminated or revoked sooner. Performed at [facility], [place] [address], [place], [place] [address]     Studies: No results found.  Scheduled Meds: . allopurinol  300 mg Oral Daily  . atorvastatin  20 mg Oral QPM  . benzonatate  100 mg Oral BID  . diltiazem  180 mg Oral Daily  . ferrous sulfate  325 mg Oral Q breakfast  . insulin aspart  0-15 Units Subcutaneous TID WC  . insulin aspart  0-5 Units Subcutaneous QHS  . insulin glargine  18 Units Subcutaneous QHS  . metoprolol tartrate  50 mg Oral BID  .  pantoprazole  40 mg Oral Daily  . polyethylene glycol  17 g Oral Daily  . potassium chloride SA  20 mEq Oral Daily  . sodium chloride flush  3 mL Intravenous Q12H  . torsemide  20 mg Oral BID  . warfarin  7.5 mg Oral ONCE-1600  . Warfarin - Pharmacist Dosing Inpatient   Does not apply q1600   Continuous Infusions: . sodium chloride      Principal Problem:   Acute on chronic heart failure with preserved ejection fraction (HFpEF) (HCC) Active Problems:   Hypertensive heart disease   Morbid obesity (HCC)   OSA (obstructive sleep apnea)   Nonischemic cardiomyopathy (HCC)   Anticoagulated on Coumadin   Atrial fibrillation with slow ventricular response (HCC)   AKI (acute kidney injury) ([place])   DM (diabetes mellitus), type 2, uncontrolled, with renal complications ([place])   CKD stage 3 due to type 2 diabetes mellitus ([place])   Pulmonary hypertension ([place])   Acute systolic congestive heart failure (HCC)   Acute exacerbation of CHF (congestive heart failure) (HCC)   Bilateral leg edema   Chronic renal failure  Time spent:   Irwin Brakeman, MD [facility] 04/06/2019, 11:36 AM    LOS: 8 days  How to contact the [facility] Attending or Consulting provider 7A -  7P or covering provider during after hours [place], for this patient?  1. Check the care team in [facility] and look for a) attending/consulting TRH provider listed and b) the [facility] team listed 2. Log into [email or web address] and use [place]'s universal password to access. If you do not have the password, please contact the hospital operator. 3. Locate the [facility] provider you are looking for under [facility] and page to a number that you can be directly reached. 4. If you still have difficulty reaching the provider, please page the [facility] (Director on Call) for the Hospitalists listed on amion for assistance.

## 2019-04-06 NOTE — Progress Notes (Signed)
ANTICOAGULATION CONSULT NOTE -   Pharmacy Consult for warfarin Indication: atrial fibrillation  Allergies  Allergen Reactions  . Codeine     Headache     Patient Measurements: Height: _0  (162.6 cm) Weight: 127.7 kg (281 lb 8 oz) IBW/kg (Calculated) : 59.2  Vital Signs: Temp: 98 F (36.7 C) (04/03 0449) Temp Source: Oral (04/03 0449) BP: 115/71 (04/03 0449) Pulse Rate: 62 (04/03 0449)  Labs: Recent Labs    04/04/19 0448 04/05/19 0452 04/06/19 0552  LABPROT 21.2* 24.2* 23.8*  INR 1.8* 2.2* 2.1*  CREATININE 2.26* 2.28* 2.25*    Estimated Creatinine Clearance: 39 mL/min (A) (by C-G formula based on SCr of 2.25 mg/dL (H)).   Medical History: Past Medical History:  Diagnosis Date  . CAD (coronary artery disease)    LHC 2/08:  mRCA 50%, o/w no sig CAD, EF 25%  . Cardiomyopathy ([place])    EF 35-40% in 2/16 in setting of AF with RVR >> echo 5/16 with improved LVF with EF 65-70%  . Chronic atrial fibrillation ([place])   . Chronic diastolic heart failure (HCC)    HFPF - EF ~65-70% (OFTEN EXACERBATED BY AFIB)  . CKD (chronic kidney disease)    hx of worsening renal failure and hyperK+ in setting of acute diast CHF >> required CVVHD in 4/16 and dialysis x 1 in 5/16  . CKD stage 3 due to type 2 diabetes mellitus ([place]) 09/17/2015  . Colon cancer ([place])   . Colon polyps 09/21/2012   Tubular adenoma  . Diabetes ([place])   . History of echocardiogram    Echo 05/16/14:  mild LVH, vigorous LVF, EF 65-70%, no RWMA, mean AV gradient 9 mmHg, MAC, mild MR, mod LAE, mild RVE, mild reduced RVF, severe RAE  . Hyperlipidemia   . Hypertension   . Obesity hypoventilation syndrome ([place])   . Renal insufficiency   . Sleep apnea     Medications:  Medications Prior to Admission  Medication Sig Dispense Refill Last Dose  . acetaminophen (TYLENOL) 325 MG tablet Take 2 tablets (650 mg total) by mouth every 6 (six) hours as needed for mild pain (or Fever >/= 101). 12 tablet 0 03/26/2019  .  allopurinol (ZYLOPRIM) 300 MG tablet TAKE 1 TABLET BY MOUTH EVERY DAY 90 tablet 0 03/28/2019  . atorvastatin (LIPITOR) 20 MG tablet 1 tablet daily 90 tablet 3 03/28/2019  . azelastine (OPTIVAR) 0.05 % ophthalmic solution PLACE 2 DROPS INTO BOTH EYES 2 (TWO) TIMES DAILY. (Patient taking differently: Place 2 drops into the right eye 2 (two) times daily as needed (dry eye from allergies). ) 6 mL 5 03/28/2019  . benzonatate (TESSALON PERLES) 100 MG capsule Take 1 capsule (100 mg total) by mouth 2 (two) times daily. (Patient taking differently: Take 100 mg by mouth 2 (two) times daily as needed. ) 60 capsule 3 03/28/2019  . DILT-XR 180 MG 24 hr capsule TAKE 1 CAPSULE BY MOUTH EVERY DAY (Patient taking differently: Take 180 mg by mouth in the morning and at bedtime. ) 90 capsule 0 03/28/2019  . FARXIGA 5 MG TABS tablet Take 5 mg by mouth daily.   03/28/2019  . ferrous sulfate 325 (65 FE) MG tablet Take 325 mg by mouth daily with breakfast.    03/28/2019  . furosemide (LASIX) 80 MG tablet Take 1 tablet (80 mg total) by mouth daily. 30 tablet 2 03/28/2019  . insulin aspart (NOVOLOG FLEXPEN) 100 UNIT/ML FlexPen insulin aspart (novoLOG) injection 0-9 Units 0-9 Units, Subcutaneous, 3  times daily with meals, CBG < 70: Implement Hypoglycemia Standing Orders and refer to Hypoglycemia Standing Orders sidebar report CBG 70 - 120: 0 units CBG 121 - 150: 1 unit CBG 151 - 200: 2 units CBG 201 - 250: 3 units CBG 251 - 300: 5 units CBG 301 - 350: 7 units CBG 351 - 400: 9 units CBG > 400 (Patient taking differently: Inject 9 Units into the skin in the morning and at bedtime. insulin aspart (novoLOG) injection 0-9 Units 0-9 Units, Subcutaneous, 3 times daily with meals, CBG < 70: Implement Hypoglycemia Standing Orders and refer to Hypoglycemia Standing Orders sidebar report CBG 70 - 120: 0 units CBG 121 - 150: 1 unit CBG 151 - 200: 2 units CBG 201 - 250: 3 units CBG 251 - 300: 5 units CBG 301 - 350: 7 units CBG 351 - 400: 9 units CBG >  400) 15 mL 11 03/28/2019  . insulin degludec (TRESIBA FLEXTOUCH) 100 UNIT/ML SOPN FlexTouch Pen Inject 0.18 mLs (18 Units total) into the skin at bedtime. 15 pen 1 03/28/2019  . losartan (COZAAR) 100 MG tablet Take 1 tablet (100 mg total) by mouth daily. 90 tablet 0 03/28/2019  . metFORMIN (GLUCOPHAGE) 500 MG tablet Take 1 tablet (500 mg total) by mouth 2 (two) times daily with a meal. 180 tablet 1 03/28/2019  . metoprolol tartrate (LOPRESSOR) 50 MG tablet Take 1 tablet (50 mg total) by mouth 2 (two) times daily. 180 tablet 3 03/28/2019  . pantoprazole (PROTONIX) 40 MG tablet Take 1 tablet (40 mg total) by mouth daily. (Patient taking differently: Take 40 mg by mouth daily as needed. ) 30 tablet 5 03/27/2019  . polyethylene glycol (MIRALAX / GLYCOLAX) 17 g packet Take 17 g by mouth daily. (Patient taking differently: Take 17 g by mouth daily as needed. ) 14 each 0 03/25/2019  . potassium chloride SA (KLOR-CON M20) 20 MEQ tablet TAKE 1/2 TABLET (10 MEQ TOTAL) BY MOUTH DAILY. NEED OV. *INS WILL COVER 4/19 OR LATER* 45 tablet 1 03/28/2019  . TRULICITY 1.5 ME/1.5AX SOPN INJECT 1.5 MG INTO THE SKIN ONCE A WEEK. 6 mL 0 03/22/2019  . warfarin (COUMADIN) 5 MG tablet Take 1 tablet (5 mg total) by mouth every evening. (Patient taking differently: Take 5 mg by mouth every evening. 12/2018: directions were "take as directed". 01/2019: directions were "take 1 tab po qd". Patient has been taking 1 tablet every other day, alternating 1.5 tabs on the other days.) 30 tablet 3 03/28/2019  . Insulin Pen Needle 32G X 4 MM MISC Use to give insulin daily Dx E11.29 (Patient not taking: Reported on 03/30/2019) 100 each 3 Not Taking at Unknown time  . polyvinyl alcohol (LIQUIFILM TEARS) 1.4 % ophthalmic solution Place 1 drop into both eyes as needed for dry eyes. (Patient not taking: Reported on 03/30/2019) 15 mL 0 Not Taking at Unknown time    Assessment: Pharmacy consulted to dose warfarin in patient with atrial fibrillation.  Patient's  home dose listed as alternating 5 mg and 7.5 mg doses.  INR 1.8>>2.2>> 2.1, therapeutic today  Goal of Therapy:  INR 2-3 Monitor platelets by anticoagulation protocol: Yes   Plan:  Warfarin 7.5 mg x 1 dose. Monitor daily INR and s/s of bleeding.  Isac Sarna, BS Pharm D, [place] Clinical Pharmacist Pager [phone] 04/06/2019 8:59 AM

## 2019-04-06 NOTE — Plan of Care (Signed)
  Problem: Acute Rehab PT Goals(only PT should resolve) Goal: Patient Will Transfer Sit To/From Stand Outcome: Progressing Flowsheets (Taken 04/06/2019 0853) Patient will transfer sit to/from stand: Independently Goal: Pt Will Transfer Bed To Chair/Chair To Bed Outcome: Progressing Flowsheets (Taken 04/06/2019 0853) Pt will Transfer Bed to Chair/Chair to Bed: Independently Goal: Pt Will Ambulate Outcome: Progressing Flowsheets (Taken 04/06/2019 0853) Pt will Ambulate:  75 feet  with modified independence Goal: Pt Will Go Up/Down Stairs Outcome: Progressing Flowsheets (Taken 04/06/2019 0853) Pt will Go Up / Down Stairs:  3-5 stairs  with modified independence  Clarene Critchley PT, DPT 8:54 AM, 04/06/19 [phone]

## 2019-04-06 NOTE — Evaluation (Addendum)
Physical Therapy Evaluation Patient Details Name: William Flores MRN: [id number] DOB: Sep 12, 1950 Today's Date: 04/06/2019   History of Present Illness  69 year old male with an ICM, diabetes mellitus type 2, hypertension, CKD, chronic A. fib on anticoagulation, history of colon cancer, MGUS presented with progressive weight gain fluid retention and was markedly volume overloaded with DOE admitted for IV diuresis after failing oral diuretics as outpatient management.     Clinical Impression  Patient was awake and alert sitting at EOB with nurse in room upon entering. Patient had 2 L O2 via nasal cannula and SpO2 found to be 94%. Following session patient's SpO2 dropped to 88% therefore 2 L O2 via nasal cannula were replaced for patient's SpO2 to return to a normal range. Patient ambulated with mod I using RW and requested return to room due to fatigue. Patient's balance looked good with RW. Once returned to room patient sat up in bedside chair to eat breakfast and O2 was returned. Patient would benefit from continued skilled physical therapy while in hospital and at recommended venue below to improve strength, endurance and overall functional mobility.     Follow Up Recommendations Home health PT;Supervision for mobility/OOB;Supervision - Intermittent    Equipment Recommendations  None recommended by PT    Recommendations for Other Services       Precautions / Restrictions Precautions Precautions: Fall Restrictions Weight Bearing Restrictions: No      Mobility  Bed Mobility Overal bed mobility: Independent             General bed mobility comments: Sitting up in bed independently  Transfers Overall transfer level: Modified independent Equipment used: Rolling walker (2 wheeled)             General transfer comment: Patient performed sit to stand with RW with mod I increased time  Ambulation/Gait Ambulation/Gait assistance: Modified independent (Device/Increase  time) Gait Distance (Feet): 25 Feet Assistive device: Rolling walker (2 wheeled) Gait Pattern/deviations: Step-through pattern;Decreased stride length Gait velocity: Decreased      Stairs            Wheelchair Mobility    Modified Rankin (Stroke Patients Only)       Balance Overall balance assessment: Modified Independent(Good independent sitting balance RW for standing/walking)                                           Pertinent Vitals/Pain Pain Assessment: No/denies pain    Home Living Family/patient expects to be discharged to:: Private residence Living Arrangements: Children Available Help at Discharge: Family;Available PRN/intermittently Type of Home: Other(Comment)(Double wide) Home Access: Stairs to enter Entrance Stairs-Rails: Can reach both;Right;Left Entrance Stairs-Number of Steps: 4 Home Layout: One level Home Equipment: Walker - 2 wheels;Walker - 4 wheels;Cane - single point      Prior Function Level of Independence: Independent with assistive device(s)         Comments: uses RW or rollator as needed     Hand Dominance        Extremity/Trunk Assessment   Upper Extremity Assessment Upper Extremity Assessment: Generalized weakness    Lower Extremity Assessment Lower Extremity Assessment: Generalized weakness    Cervical / Trunk Assessment Cervical / Trunk Assessment: Normal  Communication   Communication: No difficulties  Cognition Arousal/Alertness: Awake/alert Behavior During Therapy: WFL for tasks assessed/performed Overall Cognitive Status: Within Functional Limits for tasks assessed  General Comments: Alert and oriented.      General Comments      Exercises     Assessment/Plan    PT Assessment Patient needs continued PT services  PT Problem List Decreased strength;Decreased mobility;Decreased activity tolerance       PT Treatment Interventions DME  instruction;Gait training;Stair training;Functional mobility training;Therapeutic activities;Therapeutic exercise;Balance training;Neuromuscular re-education;Patient/family education    PT Goals (Current goals can be found in the Care Plan section)  Acute Rehab PT Goals Patient Stated Goal: To return to daughter's home PT Goal Formulation: With patient Time For Goal Achievement: 04/13/19 Potential to Achieve Goals: Good    Frequency Min 3X/week   Barriers to discharge        Co-evaluation               AM-PAC PT "6 Clicks" Mobility  Outcome Measure Help needed turning from your back to your side while in a flat bed without using bedrails?: None Help needed moving from lying on your back to sitting on the side of a flat bed without using bedrails?: None Help needed moving to and from a bed to a chair (including a wheelchair)?: A Little Help needed standing up from a chair using your arms (e.g., wheelchair or bedside chair)?: None Help needed to walk in hospital room?: None Help needed climbing 3-5 steps with a railing? : A Little 6 Click Score: 22    End of Session Equipment Utilized During Treatment: Gait belt;Oxygen;Other (comment)(Oxygen on initially, then removed to ambulate, and replaced following ambulation) Activity Tolerance: Patient tolerated treatment well;No increased pain;Patient limited by fatigue Patient left: in chair;with call bell/phone within reach Nurse Communication: Mobility status PT Visit Diagnosis: Other abnormalities of gait and mobility (R26.89);Muscle weakness (generalized) (M62.81)    Time: 5883-2549 PT Time Calculation (min) (ACUTE ONLY): 16 min   Charges:   PT Evaluation $PT Eval Low Complexity: 1 Low         Clarene Critchley PT, DPT 8:56 AM, 04/06/19 [phone]

## 2019-04-07 LAB — BASIC METABOLIC PANEL
Anion gap: 11 (ref 5–15)
BUN: 84 mg/dL — ABNORMAL HIGH (ref 8–23)
CO2: 30 mmol/L (ref 22–32)
Calcium: 8.7 mg/dL — ABNORMAL LOW (ref 8.9–10.3)
Chloride: 97 mmol/L — ABNORMAL LOW (ref 98–111)
Creatinine, Ser: 2.25 mg/dL — ABNORMAL HIGH (ref 0.61–1.24)
GFR calc Af Amer: 34 mL/min — ABNORMAL LOW (ref >60)
GFR calc non Af Amer: 29 mL/min — ABNORMAL LOW (ref >60)
Glucose, Bld: 104 mg/dL — ABNORMAL HIGH (ref 70–99)
Potassium: 4 mmol/L (ref 3.5–5.1)
Sodium: 138 mmol/L (ref 135–145)

## 2019-04-07 LAB — GLUCOSE, CAPILLARY
Glucose-Capillary: 117 mg/dL — ABNORMAL HIGH (ref 70–99)
Glucose-Capillary: 145 mg/dL — ABNORMAL HIGH (ref 70–99)
Glucose-Capillary: 128 mg/dL — ABNORMAL HIGH (ref 70–99)
Glucose-Capillary: 144 mg/dL — ABNORMAL HIGH (ref 70–99)

## 2019-04-07 LAB — PROTIME-INR
INR: 2.3 — ABNORMAL HIGH (ref 0.8–1.2)
Prothrombin Time: 25.4 seconds — ABNORMAL HIGH (ref 11.4–15.2)

## 2019-04-07 LAB — MAGNESIUM: Magnesium: 2.5 mg/dL — ABNORMAL HIGH (ref 1.7–2.4)

## 2019-04-07 MED ORDER — WARFARIN SODIUM 5 MG PO TABS
5.0000 mg | ORAL_TABLET | Freq: Once | ORAL | Status: AC
  Administered 2019-04-07: 5 mg via ORAL
  Filled 2019-04-07: qty 1

## 2019-04-07 MED ORDER — TORSEMIDE 20 MG PO TABS
40.0000 mg | ORAL_TABLET | Freq: Two times a day (BID) | ORAL | Status: DC
  Administered 2019-04-07 – 2019-04-08 (×3): 40 mg via ORAL
  Filled 2019-04-07 (×2): qty 2

## 2019-04-07 NOTE — Progress Notes (Signed)
ANTICOAGULATION CONSULT NOTE -   Pharmacy Consult for warfarin Indication: atrial fibrillation  Allergies  Allergen Reactions  . Codeine     Headache     Patient Measurements: Height: _0  (162.6 cm) Weight: 127.5 kg (281 lb) IBW/kg (Calculated) : 59.2  Vital Signs: Temp: 97.9 F (36.6 C) (04/04 0558) Temp Source: Oral (04/04 0558) BP: 115/58 (04/04 0558) Pulse Rate: 66 (04/04 0558)  Labs: Recent Labs    04/05/19 0452 04/06/19 0552 04/07/19 0538  LABPROT 24.2* 23.8* 25.4*  INR 2.2* 2.1* 2.3*  CREATININE 2.28* 2.25* 2.25*    Estimated Creatinine Clearance: 39 mL/min (A) (by C-G formula based on SCr of 2.25 mg/dL (H)).   Medical History: Past Medical History:  Diagnosis Date  . CAD (coronary artery disease)    LHC 2/08:  mRCA 50%, o/w no sig CAD, EF 25%  . Cardiomyopathy ([place])    EF 35-40% in 2/16 in setting of AF with RVR >> echo 5/16 with improved LVF with EF 65-70%  . Chronic atrial fibrillation ([place])   . Chronic diastolic heart failure (HCC)    HFPF - EF ~65-70% (OFTEN EXACERBATED BY AFIB)  . CKD (chronic kidney disease)    hx of worsening renal failure and hyperK+ in setting of acute diast CHF >> required CVVHD in 4/16 and dialysis x 1 in 5/16  . CKD stage 3 due to type 2 diabetes mellitus ([place]) 09/17/2015  . Colon cancer ([place])   . Colon polyps 09/21/2012   Tubular adenoma  . Diabetes ([place])   . History of echocardiogram    Echo 05/16/14:  mild LVH, vigorous LVF, EF 65-70%, no RWMA, mean AV gradient 9 mmHg, MAC, mild MR, mod LAE, mild RVE, mild reduced RVF, severe RAE  . Hyperlipidemia   . Hypertension   . Obesity hypoventilation syndrome ([place])   . Renal insufficiency   . Sleep apnea     Medications:  Medications Prior to Admission  Medication Sig Dispense Refill Last Dose  . acetaminophen (TYLENOL) 325 MG tablet Take 2 tablets (650 mg total) by mouth every 6 (six) hours as needed for mild pain (or Fever >/= 101). 12 tablet 0 03/26/2019  .  allopurinol (ZYLOPRIM) 300 MG tablet TAKE 1 TABLET BY MOUTH EVERY DAY 90 tablet 0 03/28/2019  . atorvastatin (LIPITOR) 20 MG tablet 1 tablet daily 90 tablet 3 03/28/2019  . azelastine (OPTIVAR) 0.05 % ophthalmic solution PLACE 2 DROPS INTO BOTH EYES 2 (TWO) TIMES DAILY. (Patient taking differently: Place 2 drops into the right eye 2 (two) times daily as needed (dry eye from allergies). ) 6 mL 5 03/28/2019  . benzonatate (TESSALON PERLES) 100 MG capsule Take 1 capsule (100 mg total) by mouth 2 (two) times daily. (Patient taking differently: Take 100 mg by mouth 2 (two) times daily as needed. ) 60 capsule 3 03/28/2019  . DILT-XR 180 MG 24 hr capsule TAKE 1 CAPSULE BY MOUTH EVERY DAY (Patient taking differently: Take 180 mg by mouth in the morning and at bedtime. ) 90 capsule 0 03/28/2019  . FARXIGA 5 MG TABS tablet Take 5 mg by mouth daily.   03/28/2019  . ferrous sulfate 325 (65 FE) MG tablet Take 325 mg by mouth daily with breakfast.    03/28/2019  . furosemide (LASIX) 80 MG tablet Take 1 tablet (80 mg total) by mouth daily. 30 tablet 2 03/28/2019  . insulin aspart (NOVOLOG FLEXPEN) 100 UNIT/ML FlexPen insulin aspart (novoLOG) injection 0-9 Units 0-9 Units, Subcutaneous, 3 times daily  with meals, CBG < 70: Implement Hypoglycemia Standing Orders and refer to Hypoglycemia Standing Orders sidebar report CBG 70 - 120: 0 units CBG 121 - 150: 1 unit CBG 151 - 200: 2 units CBG 201 - 250: 3 units CBG 251 - 300: 5 units CBG 301 - 350: 7 units CBG 351 - 400: 9 units CBG > 400 (Patient taking differently: Inject 9 Units into the skin in the morning and at bedtime. insulin aspart (novoLOG) injection 0-9 Units 0-9 Units, Subcutaneous, 3 times daily with meals, CBG < 70: Implement Hypoglycemia Standing Orders and refer to Hypoglycemia Standing Orders sidebar report CBG 70 - 120: 0 units CBG 121 - 150: 1 unit CBG 151 - 200: 2 units CBG 201 - 250: 3 units CBG 251 - 300: 5 units CBG 301 - 350: 7 units CBG 351 - 400: 9 units CBG >  400) 15 mL 11 03/28/2019  . insulin degludec (TRESIBA FLEXTOUCH) 100 UNIT/ML SOPN FlexTouch Pen Inject 0.18 mLs (18 Units total) into the skin at bedtime. 15 pen 1 03/28/2019  . losartan (COZAAR) 100 MG tablet Take 1 tablet (100 mg total) by mouth daily. 90 tablet 0 03/28/2019  . metFORMIN (GLUCOPHAGE) 500 MG tablet Take 1 tablet (500 mg total) by mouth 2 (two) times daily with a meal. 180 tablet 1 03/28/2019  . metoprolol tartrate (LOPRESSOR) 50 MG tablet Take 1 tablet (50 mg total) by mouth 2 (two) times daily. 180 tablet 3 03/28/2019  . pantoprazole (PROTONIX) 40 MG tablet Take 1 tablet (40 mg total) by mouth daily. (Patient taking differently: Take 40 mg by mouth daily as needed. ) 30 tablet 5 03/27/2019  . polyethylene glycol (MIRALAX / GLYCOLAX) 17 g packet Take 17 g by mouth daily. (Patient taking differently: Take 17 g by mouth daily as needed. ) 14 each 0 03/25/2019  . potassium chloride SA (KLOR-CON M20) 20 MEQ tablet TAKE 1/2 TABLET (10 MEQ TOTAL) BY MOUTH DAILY. NEED OV. *INS WILL COVER 4/19 OR LATER* 45 tablet 1 03/28/2019  . TRULICITY 1.5 PP/2.9JJ SOPN INJECT 1.5 MG INTO THE SKIN ONCE A WEEK. 6 mL 0 03/22/2019  . warfarin (COUMADIN) 5 MG tablet Take 1 tablet (5 mg total) by mouth every evening. (Patient taking differently: Take 5 mg by mouth every evening. 12/2018: directions were "take as directed". 01/2019: directions were "take 1 tab po qd". Patient has been taking 1 tablet every other day, alternating 1.5 tabs on the other days.) 30 tablet 3 03/28/2019  . Insulin Pen Needle 32G X 4 MM MISC Use to give insulin daily Dx E11.29 (Patient not taking: Reported on 03/30/2019) 100 each 3 Not Taking at Unknown time  . polyvinyl alcohol (LIQUIFILM TEARS) 1.4 % ophthalmic solution Place 1 drop into both eyes as needed for dry eyes. (Patient not taking: Reported on 03/30/2019) 15 mL 0 Not Taking at Unknown time    Assessment: Pharmacy consulted to dose warfarin in patient with atrial fibrillation.  Patient's  home dose listed as alternating 5 mg and 7.5 mg doses.  INR  2.3, therapeutic today  Goal of Therapy:  INR 2-3 Monitor platelets by anticoagulation protocol: Yes   Plan:  Warfarin 5 mg x 1 dose. Monitor daily INR and s/s of bleeding.  Isac Sarna, BS Pharm D, [place] Clinical Pharmacist Pager [phone] 04/07/2019 8:32 AM

## 2019-04-07 NOTE — Progress Notes (Signed)
PROGRESS NOTE [facility] [place] [facility]  William Flores  ITG:[id number]  DOB: 14-Jan-1950  DOA: 03/28/2019 PCP: [name], [name], MD  Brief Admission Hx: 69 year old male with an ICM, diabetes mellitus type 2, hypertension, CKD, chronic A. fib on anticoagulation, history of colon cancer, MGUS presented with progressive weight gain fluid retention and was markedly volume overloaded with DOE admitted for IV diuresis after failing oral diuretics as outpatient management.  MDM/Assessment & Plan:   1. Heart failure preserved EF-he has known nonischemic cardiomyopathy and right heart failure with severe pulmonary hypertension.  He presented with anasarca.  He was volume overloaded but slowly improving.   He is getting closer to his reported baseline weight of 280.   Cardiology has seen him and started him on an IV Lasix infusion.  He is responding well to this therapy.  He has diuresed better on the IV Lasix infusion.  He was given a dose of metolazone on 04/02/19.   Now that he is down to 280 pounds I transitioned to torsemide, with low output, will titrate to 40 mg BID.  Follow response.     2. Acute on chronic respiratory failure with hypoxia-secondary to diastolic CHF exacerbation-he is improving slowly  he remains volume overloaded and transitioning off IV lasix infusion to oral torsemide.   3. Stage IIIa CKD-creatinine has remained stable in last couple of days.   We are holding losartan and Metformin from home use.    We are tolerating a slight bump in creatinine with his acute need for diuresis. 4. Permanent atrial fibrillation-he is on warfarin for full anticoagulation and pharmacy is working to adjust his warfarin doses in the hospital, monitoring PT/INR daily.  His rate is well controlled on metoprolol 50 mg twice a day and Cardizem CD. 5. Type 2 diabetes mellitus-Metformin discontinued, Trulicity on hold, he is on Lantus and NovoLog sliding scale with frequent CBG monitoring.  DVT prophylaxis:  Warfarin, SCDs Code Status: Full Family Communication: Patient briefed at bedside on plan of care and verbalizes understanding Disposition Plan: Continue diuresis as he remains volume overloaded, titrating torsemide today, planning home tomorrow if ok with cardiology  Consultants:  [facility] cardiology  Procedures:    Antimicrobials:     Subjective: Patient says he has been urinating.  His edema he reports is much better.   Objective: Vitals:   04/06/19 1555 04/06/19 2100 04/06/19 2205 04/07/19 0558  BP: 109/65  101/62 (!) 115/58  Pulse: 60 62 (!) 59 66  Resp: 20 20 20 18   Temp: 98.9 F (37.2 C)  98.2 F (36.8 C) 97.9 F (36.6 C)  TempSrc: Oral  Oral Oral  SpO2: 94% 92% 98% 97%  Weight:    127.5 kg  Height:        Intake/Output Summary (Last 24 hours) at 04/07/2019 1202 Last data filed at 04/07/2019 0900 Gross per 24 hour  Intake 960 ml  Output 100 ml  Net 860 ml   Filed Weights   04/05/19 0500 04/06/19 0300 04/07/19 0558  Weight: 128.3 kg 127.7 kg 127.5 kg   REVIEW OF SYSTEMS  As per history otherwise all reviewed and reported negative  Exam:  General exam: Morbidly obese male sitting in bed, awake and alert Respiratory system: BBS mostly clear. No increased work of breathing. Cardiovascular system: S1 & S2 heard. No JVD, murmurs, gallops, clicks or pedal edema. Gastrointestinal system: Abdomen is largely distended positive fluid wave, soft and nontender. Normal bowel sounds heard. Central nervous system: Alert and oriented. No  focal neurological deficits. Extremities: 1+ pitting edema bilateral lower extremities  Data Reviewed: Basic Metabolic Panel: Recent Labs  Lab 04/02/19 0800 04/02/19 0800 04/03/19 0435 04/04/19 0448 04/05/19 0452 04/06/19 0552 04/07/19 0538  NA 136   < > 135 138 138 138 138  K 4.9   < > 4.6 4.3 4.1 4.0 4.0  CL 101   < > 99 97* 98 96* 97*  CO2 25   < > 28 29 30  32 30  GLUCOSE 165*   < > 133* 113* 156* 134* 104*  BUN 65*    < > 73* 75* 77* 81* 84*  CREATININE 2.04*   < > 2.13* 2.26* 2.28* 2.25* 2.25*  CALCIUM 8.6*   < > 8.6* 9.1 8.8* 9.0 8.7*  MG  --   --   --  2.6* 2.4  --  2.5*  PHOS 5.2*  --   --   --   --   --   --    < > = values in this interval not displayed.   Liver Function Tests: Recent Labs  Lab 04/02/19 0800  ALBUMIN 3.4*   No results for input(s): LIPASE, AMYLASE in the last 168 hours. No results for input(s): AMMONIA in the last 168 hours. CBC: Recent Labs  Lab 04/01/19 0855 04/03/19 0435  WBC 5.0 5.4  HGB 9.6* 9.2*  HCT 36.1* 34.4*  MCV 90.9 90.3  PLT 206 212   Cardiac Enzymes: No results for input(s): CKTOTAL, CKMB, CKMBINDEX, TROPONINI in the last 168 hours. CBG (last 3)  Recent Labs    04/06/19 2207 04/07/19 0734 04/07/19 1154  GLUCAP 185* 117* 145*   Recent Results (from the past 240 hour(s))  SARS CORONAVIRUS 2 (TAT 6-24 HRS) Nasopharyngeal Nasopharyngeal Swab     Status: None   Collection Time: 03/28/19  3:07 PM   Specimen: Nasopharyngeal Swab  Result Value Ref Range Status   SARS Coronavirus 2 NEGATIVE NEGATIVE Final    Comment: (NOTE) SARS-CoV-2 target nucleic acids are NOT DETECTED. The SARS-CoV-2 RNA is generally detectable in upper and lower respiratory specimens during the acute phase of infection. Negative results do not preclude SARS-CoV-2 infection, do not rule out co-infections with other pathogens, and should not be used as the sole basis for treatment or other patient management decisions. Negative results must be combined with clinical observations, patient history, and epidemiological information. The expected result is Negative. Fact Sheet for Patients: SugarRoll.be Fact Sheet for Healthcare Providers: https://www.woods-mathews.com/ This test is not yet approved or cleared by the [place] FDA and  has been authorized for detection and/or diagnosis of SARS-CoV-2 by FDA under an Emergency Use  Authorization (EUA). This EUA will remain  in effect (meaning this test can be used) for the duration of the COVID-19 declaration under Section 56 4(b)(1) of the Act, 21 U.S.C. section 360bbb-3(b)(1), unless the authorization is terminated or revoked sooner. Performed at [facility], [place] [address] [place], [place] [address]     Studies: No results found.  Scheduled Meds: . allopurinol  300 mg Oral Daily  . atorvastatin  20 mg Oral QPM  . benzonatate  100 mg Oral BID  . diltiazem  180 mg Oral Daily  . ferrous sulfate  325 mg Oral Q breakfast  . insulin aspart  0-15 Units Subcutaneous TID WC  . insulin aspart  0-5 Units Subcutaneous QHS  . insulin glargine  18 Units Subcutaneous QHS  . metoprolol tartrate  50 mg Oral BID  .  pantoprazole  40 mg Oral Daily  . polyethylene glycol  17 g Oral Daily  . potassium chloride SA  20 mEq Oral Daily  . sodium chloride flush  3 mL Intravenous Q12H  . torsemide  40 mg Oral BID  . warfarin  5 mg Oral ONCE-1600  . Warfarin - Pharmacist Dosing Inpatient   Does not apply q1600   Continuous Infusions: . sodium chloride      Principal Problem:   Acute on chronic heart failure with preserved ejection fraction (HFpEF) (HCC) Active Problems:   Hypertensive heart disease   Morbid obesity (HCC)   OSA (obstructive sleep apnea)   Nonischemic cardiomyopathy (HCC)   Anticoagulated on Coumadin   Atrial fibrillation with slow ventricular response (HCC)   AKI (acute kidney injury) ([place])   DM (diabetes mellitus), type 2, uncontrolled, with renal complications ([place])   CKD stage 3 due to type 2 diabetes mellitus ([place])   Pulmonary hypertension ([place])   Acute systolic congestive heart failure (HCC)   Acute exacerbation of CHF (congestive heart failure) (HCC)   Bilateral leg edema   Chronic renal failure  Time spent:   Irwin Brakeman, MD [facility] 04/07/2019, 12:02 PM    LOS: 9 days  How to contact the [facility] Attending or Consulting  provider 7A - 7P or covering provider during after hours 7P -7A, for this patient?  1. Check the care team in [facility] and look for a) attending/consulting TRH provider listed and b) the [facility] team listed 2. Log into [email or web address] and use [place]'s universal password to access. If you do not have the password, please contact the hospital operator. 3. Locate the [facility] provider you are looking for under [facility] and page to a number that you can be directly reached. 4. If you still have difficulty reaching the provider, please page the [facility] (Director on Call) for the Hospitalists listed on amion for assistance.

## 2019-04-08 ENCOUNTER — Ambulatory Visit: Payer: Medicare Other | Admitting: Family Medicine

## 2019-04-08 DIAGNOSIS — I50810 Right heart failure, unspecified: Secondary | ICD-10-CM

## 2019-04-08 LAB — BASIC METABOLIC PANEL
Anion gap: 12 (ref 5–15)
BUN: 89 mg/dL — ABNORMAL HIGH (ref 8–23)
CO2: 31 mmol/L (ref 22–32)
Calcium: 8.9 mg/dL (ref 8.9–10.3)
Chloride: 97 mmol/L — ABNORMAL LOW (ref 98–111)
Creatinine, Ser: 2.47 mg/dL — ABNORMAL HIGH (ref 0.61–1.24)
GFR calc Af Amer: 30 mL/min — ABNORMAL LOW (ref >60)
GFR calc non Af Amer: 26 mL/min — ABNORMAL LOW (ref >60)
Glucose, Bld: 122 mg/dL — ABNORMAL HIGH (ref 70–99)
Potassium: 4.1 mmol/L (ref 3.5–5.1)
Sodium: 140 mmol/L (ref 135–145)

## 2019-04-08 LAB — MAGNESIUM: Magnesium: 2.4 mg/dL (ref 1.7–2.4)

## 2019-04-08 LAB — GLUCOSE, CAPILLARY
Glucose-Capillary: 97 mg/dL (ref 70–99)
Glucose-Capillary: 148 mg/dL — ABNORMAL HIGH (ref 70–99)
Glucose-Capillary: 123 mg/dL — ABNORMAL HIGH (ref 70–99)
Glucose-Capillary: 118 mg/dL — ABNORMAL HIGH (ref 70–99)
Glucose-Capillary: 194 mg/dL — ABNORMAL HIGH (ref 70–99)

## 2019-04-08 LAB — PROTIME-INR
INR: 2.4 — ABNORMAL HIGH (ref 0.8–1.2)
Prothrombin Time: 26.1 seconds — ABNORMAL HIGH (ref 11.4–15.2)

## 2019-04-08 MED ORDER — WARFARIN SODIUM 7.5 MG PO TABS
7.5000 mg | ORAL_TABLET | Freq: Once | ORAL | Status: AC
  Administered 2019-04-08: 7.5 mg via ORAL
  Filled 2019-04-08: qty 1

## 2019-04-08 NOTE — Progress Notes (Signed)
Report call to nurse for room 19 on 3E at [facility].

## 2019-04-08 NOTE — TOC Initial Note (Signed)
Transition of Care [facility]) - Initial/Assessment Note    Patient Details  Name: KEYMON MCELROY MRN: [id number] Date of Birth: 06/16/1950  Transition of [facility]) CM/SW Contact:    Trish Mage, LCSW Phone Number: 04/08/2019, 11:03 AM  Clinical Narrative:   Patient seen in response to PT recommendation of [facility].  Mr Fitterer states he is now living with his daughter at [facility] in [place].  He is open to PT, but needs to call his daughter to make sure it is OK with her.  He has O2 services through [facility]. Upon arrival, he informed me that he is being transferred to [facility] today. TOC will continue to follow during the course of hospitalization.              Expected Discharge Plan: [place] Barriers to Discharge: Continued Medical Work up   Patient Goals and CMS Choice Patient states their goals for this hospitalization and ongoing recovery are:: "They're transferring me to [facility]."      Expected Discharge Plan and Services Expected Discharge Plan: [place]   Discharge Planning Services: CM Consult Post Acute Care Choice: [place] arrangements for the past 2 months: Single Family Home Expected Discharge Date: 03/31/19                                    Prior Living Arrangements/Services Living arrangements for the past 2 months: Single Family Home Lives with:: Adult Children Patient language and need for interpreter reviewed:: Yes Do you feel safe going back to the place where you live?: Yes      Need for Family Participation in Patient Care: Yes (Comment) Care giver support system in place?: Yes (comment)   Criminal Activity/Legal Involvement Pertinent to Current Situation/Hospitalization: No - Comment as needed  Activities of Daily Living Home Assistive Devices/Equipment: Walker (specify type), Oxygen, CPAP, CBG Meter ADL Screening (condition at time of admission) Patient's cognitive ability adequate to safely  complete daily activities?: Yes Is the patient deaf or have difficulty hearing?: No Does the patient have difficulty seeing, even when wearing glasses/contacts?: No Does the patient have difficulty concentrating, remembering, or making decisions?: No Patient able to express need for assistance with ADLs?: Yes Does the patient have difficulty dressing or bathing?: No Independently performs ADLs?: Yes (appropriate for developmental age) Does the patient have difficulty walking or climbing stairs?: Yes Weakness of Legs: Both Weakness of Arms/Hands: None  Permission Sought/Granted                  Emotional Assessment Appearance:: Appears stated age Attitude/Demeanor/Rapport: Engaged Affect (typically observed): Appropriate Orientation: : Oriented to Self, Oriented to Place, Oriented to  Time, Oriented to Situation Alcohol / Substance Use: Not Applicable Psych Involvement: No (comment)  Admission diagnosis:  Bilateral leg edema [K53.9] Acute systolic congestive heart failure (HCC) [I50.21] Acute on chronic systolic CHF (congestive heart failure) (HCC) [I50.23] Chronic renal failure, unspecified CKD stage [N18.9] Acute exacerbation of CHF (congestive heart failure) ([place]) [I50.9] Patient Active Problem List   Diagnosis Date Noted  . Bilateral leg edema   . Chronic renal failure   . Acute exacerbation of CHF (congestive heart failure) ([place]) 03/29/2019  . Acute systolic congestive heart failure ([place]) 03/28/2019  . Acute on chronic heart failure with preserved ejection fraction (HFpEF) ([place]) 03/28/2019  . Pulmonary hypertension ([place]) 12/28/2018  . Acute  anemia 12/25/2018  . Hyperlipidemia associated with type 2 diabetes mellitus ([place]) 05/18/2017  . Morbid obesity with BMI of 45.0-49.9, adult ([place]) 05/18/2017  . CKD stage 3 due to type 2 diabetes mellitus ([place]) 09/17/2015  . MGUS (monoclonal gammopathy of unknown significance) 05/27/2015  . Normocytic anemia 11/04/2014  .  Peripheral edema   . Essential hypertension   . DM (diabetes mellitus), type 2, uncontrolled, with renal complications ([place]) 37/85/8850  . Dysphagia 04/20/2014  . Atrial fibrillation with slow ventricular response ([place]) 04/14/2014  . AKI (acute kidney injury) ([place]) 04/14/2014  . Anticoagulated on Coumadin 03/20/2014  . Nonischemic cardiomyopathy ([place]) 02/20/2014  . OSA (obstructive sleep apnea)   . Right heart failure ([place]) 02/10/2014  . Morbid obesity ([place]) 08/05/2013  . History of colon cancer   . Gout 05/25/2012  . Hypertensive heart disease    PCP:  [name], [name], MD Pharmacy:   [id number] [id number]: [phone]: [phone]    Social Determinants of Health (SDOH) Interventions    Readmission Risk Interventions No flowsheet data found.

## 2019-04-08 NOTE — Plan of Care (Signed)
  Problem: Education: Goal: Knowledge of General Education information will improve Description: Including pain rating scale, medication(s)/side effects and non-pharmacologic comfort measures Outcome: Progressing   Problem: Health Behavior/Discharge Planning: Goal: Ability to manage health-related needs will improve Outcome: Progressing   Problem: Clinical Measurements: Goal: Ability to maintain clinical measurements within normal limits will improve Outcome: Progressing Goal: Will remain free from infection Outcome: Progressing Goal: Diagnostic test results will improve Outcome: Progressing Goal: Respiratory complications will improve Outcome: Progressing Goal: Cardiovascular complication will be avoided Outcome: Progressing   Problem: Activity: Goal: Risk for activity intolerance will decrease Outcome: Progressing   Problem: Nutrition: Goal: Adequate nutrition will be maintained Outcome: Progressing   Problem: Coping: Goal: Level of anxiety will decrease Outcome: Progressing   Problem: Elimination: Goal: Will not experience complications related to bowel motility Outcome: Progressing Goal: Will not experience complications related to urinary retention Outcome: Progressing   Problem: Pain Managment: Goal: General experience of comfort will improve Outcome: Progressing   Problem: Safety: Goal: Ability to remain free from injury will improve Outcome: Progressing   Problem: Skin Integrity: Goal: Risk for impaired skin integrity will decrease Outcome: Progressing   Problem: Education: Goal: Ability to demonstrate management of disease process will improve Outcome: Progressing Goal: Ability to verbalize understanding of medication therapies will improve Outcome: Progressing Goal: Individualized Educational Video(s) Outcome: Progressing   Problem: Activity: Goal: Capacity to carry out activities will improve Outcome: Progressing   Problem: Cardiac: Goal:  Ability to achieve and maintain adequate cardiopulmonary perfusion will improve Outcome: Progressing   Problem: Education: Goal: Knowledge of disease or condition will improve Outcome: Progressing Goal: Knowledge of the prescribed therapeutic regimen will improve Outcome: Progressing Goal: Individualized Educational Video(s) Outcome: Progressing   Problem: Activity: Goal: Ability to tolerate increased activity will improve Outcome: Progressing Goal: Will verbalize the importance of balancing activity with adequate rest periods Outcome: Progressing   Problem: Respiratory: Goal: Ability to maintain a clear airway will improve Outcome: Progressing Goal: Levels of oxygenation will improve Outcome: Progressing Goal: Ability to maintain adequate ventilation will improve Outcome: Progressing   Problem: Education: Goal: Knowledge of disease or condition will improve Outcome: Progressing   Problem: Cardiac: Goal: Ability to achieve and maintain adequate cardiopulmonary perfusion will improve Outcome: Progressing   Problem: Education: Goal: Knowledge of disease or condition will improve Outcome: Progressing   Problem: Education: Goal: Knowledge of the prescribed therapeutic regimen will improve Outcome: Progressing   Problem: Education: Goal: Individualized Educational Video(s) Outcome: Progressing   Problem: Education: Goal: Individualized Educational Video(s) Outcome: Progressing   Problem: Activity: Goal: Ability to tolerate increased activity will improve Outcome: Progressing   Problem: Activity: Goal: Will verbalize the importance of balancing activity with adequate rest periods Outcome: Progressing   Problem: Respiratory: Goal: Ability to maintain a clear airway will improve Outcome: Progressing

## 2019-04-08 NOTE — Progress Notes (Signed)
    S: Pt arrived from [facility] this evening.  Sitting up in bed w/o complaints.  Just finished dinner.  O:   Vitals:   04/08/19 1441 04/08/19 1741  BP: (!) 120/54 115/60  Pulse: 69 78  Resp:  18  Temp:  98.4 F (36.9 C)  SpO2: 94% 99%   Pleasant, NAD, AAOx3.  Neck obese, difficult to gauge jvp. Lungs w/ bibasilar crackles. Abd protuberant and firm, NT, BS+x4. Ext w/ 2+ bilat LEE to thighs.  Lab Results  Component Value Date   CREATININE 2.47 (H) 04/08/2019   BUN 89 (H) 04/08/2019   NA 140 04/08/2019   K 4.1 04/08/2019   CL 97 (L) 04/08/2019   CO2 31 04/08/2019    Lab Results  Component Value Date   WBC 5.4 04/03/2019   HGB 9.2 (L) 04/03/2019   HCT 34.4 (L) 04/03/2019   MCV 90.3 04/03/2019   PLT 212 04/03/2019    Lab Results  Component Value Date   INR 2.4 (H) 04/08/2019   INR 2.3 (H) 04/07/2019   INR 2.1 (H) 04/06/2019     A/P:  1.  Acute on chronic diastolic CHF/PAH: EF 40-98% w/ severe RVE, RV dysfxn, and severe PAH.  Tx to [facility] from [facility] for eval by advanced CHF team in the setting of progressive cardiorenal syndrome requiring holding of diuretic therapy.  Pt in no acute distress this evening.  CHF team to eval in AM.  2.  Permanent Afib:  Rate controlled on  blocker and CCB.  INR Rx.  3.  AKI on CKD III:  Baseline 1.4-1.6. Admit @ 1.8  2.47 4/5.  Diuretics on hold.  Murray Hodgkins, NP

## 2019-04-08 NOTE — Consult Note (Deleted)
  Entered in error. See consult noted 4/6

## 2019-04-08 NOTE — Care Management Important Message (Signed)
Important Message  Patient Details  Name: William Flores MRN: [id number] Date of Birth: 1950-07-11   Medicare Important Message Given:  Yes     [name] 04/08/2019, 12:13 PM

## 2019-04-08 NOTE — Progress Notes (Signed)
PROGRESS NOTE [facility]  AGAM DAVENPORT  GUR:[id number]  DOB: [birth year]  DOA: 03/28/2019 PCP: [name], [name], MD  Brief Admission Hx: 69 year old male with an ICM, diabetes mellitus type 2, hypertension, CKD, chronic A. fib on anticoagulation, history of colon cancer, MGUS presented with progressive weight gain fluid retention and was markedly volume overloaded with DOE admitted for IV diuresis after failing oral diuretics as outpatient management.  MDM/Assessment & Plan:   1. Heart failure preserved EF-he has known nonischemic cardiomyopathy and right heart failure with severe pulmonary hypertension.  He presented with anasarca.  He was volume overloaded but slowly improving.   He is getting closer to his reported baseline weight of 280.   Cardiology has seen him and started him on an IV Lasix infusion.  He has diuresed better on the IV Lasix infusion.  He was given a dose of metolazone on 04/02/19.   He was transitioned to torsemide over weekend but poor response.  Dr. Harl Bowie saw him today and recommended advanced heart failure team consultation.  He is transferring to [facility] eval by heart failure team.  Holding diuretics for now as his creatinine is bumping.      2. Acute on chronic respiratory failure with hypoxia-secondary to diastolic CHF exacerbation-he is improving slowly  he remains volume overloaded and transitioning off IV lasix infusion to oral torsemide.   3. Stage IIIa CKD-creatinine has remained stable in last couple of days.   We are holding losartan and Metformin from home use.    We are tolerating a slight bump in creatinine with his acute need for diuresis. 4. Permanent atrial fibrillation-he is on warfarin for full anticoagulation and pharmacy is working to adjust his warfarin doses in the hospital, monitoring PT/INR daily.  His rate is well controlled on metoprolol 50 mg twice a day and Cardizem CD. 5. Type 2 diabetes mellitus-Metformin discontinued, Trulicity on hold,  he is on Lantus and NovoLog sliding scale with frequent CBG monitoring.  DVT prophylaxis: Warfarin, SCDs Code Status: Full Family Communication: spoke with daughter by telephone Disposition Plan: Continue diuresis as he remains volume overloaded, transfer to [facility] for consultation with advanced heart failure team.   Consultants:  [facility] cardiology  Procedures:    Antimicrobials:     Subjective: Patient still has a lot of fluid in his abdomen.   Objective: Vitals:   04/07/19 1626 04/07/19 2058 04/08/19 0500 04/08/19 0600  BP: 116/71 119/62  112/65  Pulse: (!) 49 60  65  Resp: 20 18  18   Temp: 98.1 F (36.7 C) (!) 97.5 F (36.4 C)  98.5 F (36.9 C)  TempSrc: Oral Oral  Oral  SpO2: 94% 96%  92%  Weight:   127.5 kg   Height:        Intake/Output Summary (Last 24 hours) at 04/08/2019 1056 Last data filed at 04/07/2019 1700 Gross per 24 hour  Intake 600 ml  Output --  Net 600 ml   Filed Weights   04/06/19 0300 04/07/19 0558 04/08/19 0500  Weight: 127.7 kg 127.5 kg 127.5 kg   REVIEW OF SYSTEMS  As per history otherwise all reviewed and reported negative  Exam:  General exam: Morbidly obese male sitting in bed, awake and alert Respiratory system: BBS mostly clear. No increased work of breathing. Cardiovascular system: S1 & S2 heard. No JVD, murmurs, gallops, clicks or pedal edema. Gastrointestinal system: Abdomen is largely distended positive fluid wave, soft and nontender. Normal bowel sounds heard. Central nervous system:  Alert and oriented. No focal neurological deficits. Extremities: 1+ pitting edema bilateral lower extremities  Data Reviewed: Basic Metabolic Panel: Recent Labs  Lab 04/02/19 0800 04/03/19 0435 04/04/19 0448 04/05/19 0452 04/06/19 0552 04/07/19 0538 04/08/19 0418  NA 136   < > 138 138 138 138 140  K 4.9   < > 4.3 4.1 4.0 4.0 4.1  CL 101   < > 97* 98 96* 97* 97*  CO2 25   < > 29 30 32 30 31  GLUCOSE 165*   < > 113* 156* 134*  104* 122*  BUN 65*   < > 75* 77* 81* 84* 89*  CREATININE 2.04*   < > 2.26* 2.28* 2.25* 2.25* 2.47*  CALCIUM 8.6*   < > 9.1 8.8* 9.0 8.7* 8.9  MG  --   --  2.6* 2.4  --  2.5* 2.4  PHOS 5.2*  --   --   --   --   --   --    < > = values in this interval not displayed.   Liver Function Tests: Recent Labs  Lab 04/02/19 0800  ALBUMIN 3.4*   No results for input(s): LIPASE, AMYLASE in the last 168 hours. No results for input(s): AMMONIA in the last 168 hours. CBC: Recent Labs  Lab 04/03/19 0435  WBC 5.4  HGB 9.2*  HCT 34.4*  MCV 90.3  PLT 212   Cardiac Enzymes: No results for input(s): CKTOTAL, CKMB, CKMBINDEX, TROPONINI in the last 168 hours. CBG (last 3)  Recent Labs    04/07/19 1630 04/07/19 2100 04/08/19 0801  GLUCAP 128* 144* 97   No results found for this or any previous visit (from the past 240 hour(s)).  Studies: No results found.  Scheduled Meds: . allopurinol  300 mg Oral Daily  . atorvastatin  20 mg Oral QPM  . benzonatate  100 mg Oral BID  . diltiazem  180 mg Oral Daily  . ferrous sulfate  325 mg Oral Q breakfast  . insulin aspart  0-15 Units Subcutaneous TID WC  . insulin aspart  0-5 Units Subcutaneous QHS  . insulin glargine  18 Units Subcutaneous QHS  . metoprolol tartrate  50 mg Oral BID  . pantoprazole  40 mg Oral Daily  . polyethylene glycol  17 g Oral Daily  . potassium chloride SA  20 mEq Oral Daily  . sodium chloride flush  3 mL Intravenous Q12H  . warfarin  7.5 mg Oral ONCE-1600  . Warfarin - Pharmacist Dosing Inpatient   Does not apply q1600   Continuous Infusions: . sodium chloride      Principal Problem:   Acute on chronic heart failure with preserved ejection fraction (HFpEF) (HCC) Active Problems:   Hypertensive heart disease   Morbid obesity (HCC)   OSA (obstructive sleep apnea)   Nonischemic cardiomyopathy (HCC)   Anticoagulated on Coumadin   Atrial fibrillation with slow ventricular response (HCC)   AKI (acute kidney injury)  ([place])   DM (diabetes mellitus), type 2, uncontrolled, with renal complications ([place])   CKD stage 3 due to type 2 diabetes mellitus ([place])   Pulmonary hypertension ([place])   Acute systolic congestive heart failure (HCC)   Acute exacerbation of CHF (congestive heart failure) ([place])   Bilateral leg edema   Chronic renal failure  Time spent:   Irwin Brakeman, MD [facility] 04/08/2019, 10:56 AM    LOS: 10 days  How to contact the [facility] Attending or Consulting provider 7A - 7P or  covering provider during after hours [place], for this patient?  1. Check the care team in [facility] and look for a) attending/consulting TRH provider listed and b) the [facility] team listed 2. Log into [email or web address] and use [place] universal password to access. If you do not have the password, please contact the hospital operator. 3. Locate the [facility] provider you are looking for under [facility] and page to a number that you can be directly reached. 4. If you still have difficulty reaching the provider, please page the [facility] (Director on Call) for the Hospitalists listed on amion for assistance.

## 2019-04-08 NOTE — Progress Notes (Addendum)
Progress Note  Patient Name: William Flores Date of Encounter: 04/08/2019  Primary Cardiologist: Minus Breeding, MD   Subjective   Ongoing SOB  Inpatient Medications    Scheduled Meds: . allopurinol  300 mg Oral Daily  . atorvastatin  20 mg Oral QPM  . benzonatate  100 mg Oral BID  . diltiazem  180 mg Oral Daily  . ferrous sulfate  325 mg Oral Q breakfast  . insulin aspart  0-15 Units Subcutaneous TID WC  . insulin aspart  0-5 Units Subcutaneous QHS  . insulin glargine  18 Units Subcutaneous QHS  . metoprolol tartrate  50 mg Oral BID  . pantoprazole  40 mg Oral Daily  . polyethylene glycol  17 g Oral Daily  . potassium chloride SA  20 mEq Oral Daily  . sodium chloride flush  3 mL Intravenous Q12H  . torsemide  40 mg Oral BID  . warfarin  7.5 mg Oral ONCE-1600  . Warfarin - Pharmacist Dosing Inpatient   Does not apply q1600   Continuous Infusions: . sodium chloride     PRN Meds: sodium chloride, acetaminophen **OR** acetaminophen, acetaminophen, guaiFENesin-dextromethorphan, ondansetron **OR** ondansetron (ZOFRAN) IV, polyethylene glycol, polyvinyl alcohol, sodium chloride flush, traZODone   Vital Signs    Vitals:   04/07/19 1626 04/07/19 2058 04/08/19 0500 04/08/19 0600  BP: 116/71 119/62  112/65  Pulse: (!) 49 60  65  Resp: 20 18  18   Temp: 98.1 F (36.7 C) (!) 97.5 F (36.4 C)  98.5 F (36.9 C)  TempSrc: Oral Oral  Oral  SpO2: 94% 96%  92%  Weight:   127.5 kg   Height:        Intake/Output Summary (Last 24 hours) at 04/08/2019 0947 Last data filed at 04/07/2019 1700 Gross per 24 hour  Intake 600 ml  Output --  Net 600 ml   Last 3 Weights 04/08/2019 04/07/2019 04/06/2019  Weight (lbs) 281 lb 1.4 oz 281 lb 281 lb 8 oz  Weight (kg) 127.5 kg 127.461 kg 127.688 kg      Telemetry    N/a, tele has been ordered - Personally Reviewed  ECG    n/a - Personally Reviewed  Physical Exam   GEN: No acute distress.   Neck: elevated JVD Cardiac: RRR, no murmurs,  rubs, or gallops.  Respiratory: Clear to auscultation bilaterally. GI: Soft, nontender, non-distended  MS: 2+ bialteral edema to above the knee Neuro:  Nonfocal  Psych: Normal affect   Labs    High Sensitivity Troponin:  No results for input(s): TROPONINIHS in the last 720 hours.    Chemistry Recent Labs  Lab 04/02/19 0800 04/03/19 0435 04/06/19 0552 04/07/19 0538 04/08/19 0418  NA 136   < > 138 138 140  K 4.9   < > 4.0 4.0 4.1  CL 101   < > 96* 97* 97*  CO2 25   < > 32 30 31  GLUCOSE 165*   < > 134* 104* 122*  BUN 65*   < > 81* 84* 89*  CREATININE 2.04*   < > 2.25* 2.25* 2.47*  CALCIUM 8.6*   < > 9.0 8.7* 8.9  ALBUMIN 3.4*  --   --   --   --   GFRNONAA 33*   < > 29* 29* 26*  GFRAA 38*   < > 34* 34* 30*  ANIONGAP 10   < > 10 11 12    < > = values in this interval not displayed.  Hematology Recent Labs  Lab 04/03/19 0435  WBC 5.4  RBC 3.81*  HGB 9.2*  HCT 34.4*  MCV 90.3  MCH 24.1*  MCHC 26.7*  RDW 20.1*  PLT 212    BNPNo results for input(s): BNP, PROBNP in the last 168 hours.   DDimer No results for input(s): DDIMER in the last 168 hours.   Radiology    No results found.  Cardiac Studies    Patient Profile     69 y.o. male with a hx of chronic atrial fibrillation, diastolic CHF, and episodes of AKI who is being seen today for the evaluation of CHFat the request ofDr. Emokpae.  Assessment & Plan    1. Acute on chronic diastolic HF complicated by severe pulm HTN and severe RV failure -  LVEF 55 to 60% by most recent echocardiogram with severe right ventricular enlargement and severely reduced RV systolic function with severe pulmonary hypertension. - Severe pulmonary hypertension and RV failure likely secondary to combination of morbid obesity and untreated obstructive sleep apnea/obesity hypoventilation syndrome.    - has been on lasix gtt, changed to oral torsemide 75m bid yesterday - I/Os are incomplete. Weight 281 lbs today. Admit weight  284 lbs - significant rise in Cr, would hold evening torsemide. When restarted would start lower dose, no additional diuretic today.   - essentially I think he has severe RV failure with failiure to diurese, remains volume overloaded, significant worsening of renal function. I think he may require inotrope assistance to diurese. Will discuss with CHF team and primary team [facility]   2. Permanent afib - rate controlled, on coumadin   3. AKI on CKD 3 - Cr 2.47 today, admit Cr 1.8 - baselien looks to be 1.4 to 1.6   4. OSA  5. Pulmonary HTN - echo severe RV enlargement, severe RV dysfunction. PASP 76. Reported TAPSE 2.2 however on manual measure I get TAPSE close to 1, RV S' 0.6  - pulm HTN likely multifactorial with signs of left sided diastolic dysfunction, history of OSA per notes, chronic resp failure at home on 2L [place] with reported obesity hypoventilation as well    For questions or updates, please contact [facility] Please consult [email or web address] for contact info under        Signed, BCarlyle Dolly MD  04/08/2019, 9:47 AM

## 2019-04-08 NOTE — Progress Notes (Signed)
ANTICOAGULATION CONSULT NOTE -   Pharmacy Consult for warfarin Indication: atrial fibrillation  Allergies  Allergen Reactions  . Codeine     Headache     Patient Measurements: Height: _0  (162.6 cm) Weight: 127.5 kg (281 lb 1.4 oz) IBW/kg (Calculated) : 59.2  Vital Signs: Temp: 98.5 F (36.9 C) (04/05 0600) Temp Source: Oral (04/05 0600) BP: 112/65 (04/05 0600) Pulse Rate: 65 (04/05 0600)  Labs: Recent Labs    04/06/19 0552 04/07/19 0538 04/08/19 0418  LABPROT 23.8* 25.4* 26.1*  INR 2.1* 2.3* 2.4*  CREATININE 2.25* 2.25* 2.47*    Estimated Creatinine Clearance: 35.5 mL/min (A) (by C-G formula based on SCr of 2.47 mg/dL (H)).   Medical History: Past Medical History:  Diagnosis Date  . CAD (coronary artery disease)    LHC 2/08:  mRCA 50%, o/w no sig CAD, EF 25%  . Cardiomyopathy ([place])    EF 35-40% in 2/16 in setting of AF with RVR >> echo 5/16 with improved LVF with EF 65-70%  . Chronic atrial fibrillation ([place])   . Chronic diastolic heart failure (HCC)    HFPF - EF ~65-70% (OFTEN EXACERBATED BY AFIB)  . CKD (chronic kidney disease)    hx of worsening renal failure and hyperK+ in setting of acute diast CHF >> required CVVHD in 4/16 and dialysis x 1 in 5/16  . CKD stage 3 due to type 2 diabetes mellitus ([place]) 09/17/2015  . Colon cancer ([place])   . Colon polyps 09/21/2012   Tubular adenoma  . Diabetes ([place])   . History of echocardiogram    Echo 05/16/14:  mild LVH, vigorous LVF, EF 65-70%, no RWMA, mean AV gradient 9 mmHg, MAC, mild MR, mod LAE, mild RVE, mild reduced RVF, severe RAE  . Hyperlipidemia   . Hypertension   . Obesity hypoventilation syndrome ([place])   . Renal insufficiency   . Sleep apnea     Medications:  Medications Prior to Admission  Medication Sig Dispense Refill Last Dose  . acetaminophen (TYLENOL) 325 MG tablet Take 2 tablets (650 mg total) by mouth every 6 (six) hours as needed for mild pain (or Fever >/= 101). 12 tablet 0 03/26/2019  .  allopurinol (ZYLOPRIM) 300 MG tablet TAKE 1 TABLET BY MOUTH EVERY DAY 90 tablet 0 03/28/2019  . atorvastatin (LIPITOR) 20 MG tablet 1 tablet daily 90 tablet 3 03/28/2019  . azelastine (OPTIVAR) 0.05 % ophthalmic solution PLACE 2 DROPS INTO BOTH EYES 2 (TWO) TIMES DAILY. (Patient taking differently: Place 2 drops into the right eye 2 (two) times daily as needed (dry eye from allergies). ) 6 mL 5 03/28/2019  . benzonatate (TESSALON PERLES) 100 MG capsule Take 1 capsule (100 mg total) by mouth 2 (two) times daily. (Patient taking differently: Take 100 mg by mouth 2 (two) times daily as needed. ) 60 capsule 3 03/28/2019  . DILT-XR 180 MG 24 hr capsule TAKE 1 CAPSULE BY MOUTH EVERY DAY (Patient taking differently: Take 180 mg by mouth in the morning and at bedtime. ) 90 capsule 0 03/28/2019  . FARXIGA 5 MG TABS tablet Take 5 mg by mouth daily.   03/28/2019  . ferrous sulfate 325 (65 FE) MG tablet Take 325 mg by mouth daily with breakfast.    03/28/2019  . furosemide (LASIX) 80 MG tablet Take 1 tablet (80 mg total) by mouth daily. 30 tablet 2 03/28/2019  . insulin aspart (NOVOLOG FLEXPEN) 100 UNIT/ML FlexPen insulin aspart (novoLOG) injection 0-9 Units 0-9 Units, Subcutaneous, 3  times daily with meals, CBG < 70: Implement Hypoglycemia Standing Orders and refer to Hypoglycemia Standing Orders sidebar report CBG 70 - 120: 0 units CBG 121 - 150: 1 unit CBG 151 - 200: 2 units CBG 201 - 250: 3 units CBG 251 - 300: 5 units CBG 301 - 350: 7 units CBG 351 - 400: 9 units CBG > 400 (Patient taking differently: Inject 9 Units into the skin in the morning and at bedtime. insulin aspart (novoLOG) injection 0-9 Units 0-9 Units, Subcutaneous, 3 times daily with meals, CBG < 70: Implement Hypoglycemia Standing Orders and refer to Hypoglycemia Standing Orders sidebar report CBG 70 - 120: 0 units CBG 121 - 150: 1 unit CBG 151 - 200: 2 units CBG 201 - 250: 3 units CBG 251 - 300: 5 units CBG 301 - 350: 7 units CBG 351 - 400: 9 units CBG >  400) 15 mL 11 03/28/2019  . insulin degludec (TRESIBA FLEXTOUCH) 100 UNIT/ML SOPN FlexTouch Pen Inject 0.18 mLs (18 Units total) into the skin at bedtime. 15 pen 1 03/28/2019  . losartan (COZAAR) 100 MG tablet Take 1 tablet (100 mg total) by mouth daily. 90 tablet 0 03/28/2019  . metFORMIN (GLUCOPHAGE) 500 MG tablet Take 1 tablet (500 mg total) by mouth 2 (two) times daily with a meal. 180 tablet 1 03/28/2019  . metoprolol tartrate (LOPRESSOR) 50 MG tablet Take 1 tablet (50 mg total) by mouth 2 (two) times daily. 180 tablet 3 03/28/2019  . pantoprazole (PROTONIX) 40 MG tablet Take 1 tablet (40 mg total) by mouth daily. (Patient taking differently: Take 40 mg by mouth daily as needed. ) 30 tablet 5 03/27/2019  . polyethylene glycol (MIRALAX / GLYCOLAX) 17 g packet Take 17 g by mouth daily. (Patient taking differently: Take 17 g by mouth daily as needed. ) 14 each 0 03/25/2019  . potassium chloride SA (KLOR-CON M20) 20 MEQ tablet TAKE 1/2 TABLET (10 MEQ TOTAL) BY MOUTH DAILY. NEED OV. *INS WILL COVER 4/19 OR LATER* 45 tablet 1 03/28/2019  . TRULICITY 1.5 QI/2.9NL SOPN INJECT 1.5 MG INTO THE SKIN ONCE A WEEK. 6 mL 0 03/22/2019  . warfarin (COUMADIN) 5 MG tablet Take 1 tablet (5 mg total) by mouth every evening. (Patient taking differently: Take 5 mg by mouth every evening. 12/2018: directions were "take as directed". 01/2019: directions were "take 1 tab po qd". Patient has been taking 1 tablet every other day, alternating 1.5 tabs on the other days.) 30 tablet 3 03/28/2019  . Insulin Pen Needle 32G X 4 MM MISC Use to give insulin daily Dx E11.29 (Patient not taking: Reported on 03/30/2019) 100 each 3 Not Taking at Unknown time  . polyvinyl alcohol (LIQUIFILM TEARS) 1.4 % ophthalmic solution Place 1 drop into both eyes as needed for dry eyes. (Patient not taking: Reported on 03/30/2019) 15 mL 0 Not Taking at Unknown time    Assessment: Pharmacy consulted to dose warfarin in patient with atrial fibrillation.  Patient's  home dose listed as alternating 5 mg and 7.5 mg doses.  INR  2.4, therapeutic today  Goal of Therapy:  INR 2-3 Monitor platelets by anticoagulation protocol: Yes   Plan:  Warfarin 7.5 mg x 1 dose. Monitor daily INR and s/s of bleeding.  Isac Sarna, BS Pharm D, [place] Clinical Pharmacist Pager [phone] 04/08/2019 7:54 AM

## 2019-04-09 ENCOUNTER — Encounter: Payer: Self-pay | Admitting: Family Medicine

## 2019-04-09 LAB — BASIC METABOLIC PANEL
Anion gap: 13 (ref 5–15)
BUN: 87 mg/dL — ABNORMAL HIGH (ref 8–23)
CO2: 32 mmol/L (ref 22–32)
Calcium: 8.6 mg/dL — ABNORMAL LOW (ref 8.9–10.3)
Chloride: 96 mmol/L — ABNORMAL LOW (ref 98–111)
Creatinine, Ser: 2.33 mg/dL — ABNORMAL HIGH (ref 0.61–1.24)
GFR calc Af Amer: 32 mL/min — ABNORMAL LOW (ref >60)
GFR calc non Af Amer: 28 mL/min — ABNORMAL LOW (ref >60)
Glucose, Bld: 112 mg/dL — ABNORMAL HIGH (ref 70–99)
Potassium: 4.1 mmol/L (ref 3.5–5.1)
Sodium: 141 mmol/L (ref 135–145)

## 2019-04-09 LAB — GLUCOSE, CAPILLARY
Glucose-Capillary: 116 mg/dL — ABNORMAL HIGH (ref 70–99)
Glucose-Capillary: 109 mg/dL — ABNORMAL HIGH (ref 70–99)
Glucose-Capillary: 137 mg/dL — ABNORMAL HIGH (ref 70–99)
Glucose-Capillary: 179 mg/dL — ABNORMAL HIGH (ref 70–99)

## 2019-04-09 LAB — TSH: TSH: 6.698 u[IU]/mL — ABNORMAL HIGH (ref 0.350–4.500)

## 2019-04-09 LAB — CBC
HCT: 33.9 % — ABNORMAL LOW (ref 39.0–52.0)
Hemoglobin: 9.4 g/dL — ABNORMAL LOW (ref 13.0–17.0)
MCH: 24.5 pg — ABNORMAL LOW (ref 26.0–34.0)
MCHC: 27.7 g/dL — ABNORMAL LOW (ref 30.0–36.0)
MCV: 88.3 fL (ref 80.0–100.0)
Platelets: 163 10*3/uL (ref 150–400)
RBC: 3.84 MIL/uL — ABNORMAL LOW (ref 4.22–5.81)
RDW: 20.3 % — ABNORMAL HIGH (ref 11.5–15.5)
WBC: 4.9 10*3/uL (ref 4.0–10.5)
nRBC: 0 % (ref 0.0–0.2)

## 2019-04-09 LAB — IRON AND TIBC
Iron: 38 ug/dL — ABNORMAL LOW (ref 45–182)
Saturation Ratios: 8 % — ABNORMAL LOW (ref 17.9–39.5)
TIBC: 469 ug/dL — ABNORMAL HIGH (ref 250–450)
UIBC: 431 ug/dL

## 2019-04-09 LAB — PROTIME-INR
INR: 2.4 — ABNORMAL HIGH (ref 0.8–1.2)
Prothrombin Time: 26 seconds — ABNORMAL HIGH (ref 11.4–15.2)

## 2019-04-09 MED ORDER — SODIUM CHLORIDE 0.9 % IV SOLN
510.0000 mg | INTRAVENOUS | Status: AC
  Administered 2019-04-09 – 2019-04-12 (×2): 510 mg via INTRAVENOUS
  Filled 2019-04-09 (×3): qty 17

## 2019-04-09 MED ORDER — FUROSEMIDE 10 MG/ML IJ SOLN
120.0000 mg | Freq: Once | INTRAVENOUS | Status: AC
  Administered 2019-04-09: 16:00:00 120 mg via INTRAVENOUS
  Filled 2019-04-09: qty 10

## 2019-04-09 MED ORDER — POLYETHYLENE GLYCOL 3350 17 G PO PACK: 17.0000 g | PACK | Freq: Every day | ORAL | Status: DC | PRN

## 2019-04-09 MED ORDER — FUROSEMIDE 10 MG/ML IJ SOLN
120.0000 mg | Freq: Two times a day (BID) | INTRAVENOUS | Status: DC
  Administered 2019-04-10 – 2019-04-13 (×7): 120 mg via INTRAVENOUS
  Filled 2019-04-09: qty 10
  Filled 2019-04-09 (×5): qty 12
  Filled 2019-04-09: qty 10
  Filled 2019-04-09: qty 2

## 2019-04-09 MED ORDER — SENNOSIDES-DOCUSATE SODIUM 8.6-50 MG PO TABS: 2.0000 | ORAL_TABLET | Freq: Every evening | ORAL | Status: DC | PRN

## 2019-04-09 MED ORDER — WARFARIN SODIUM 5 MG PO TABS
5.0000 mg | ORAL_TABLET | Freq: Once | ORAL | Status: AC
  Administered 2019-04-09: 5 mg via ORAL
  Filled 2019-04-09: qty 1

## 2019-04-09 NOTE — Progress Notes (Signed)
PROGRESS NOTE    William Flores  NAT:[id number] DOB: 06/19/1950 DOA: 03/28/2019 PCP: [name], [name], MD   Brief Narrative:  69 year old with ischemic cardiomyopathy, DM2, HTN, CKD, chronic A. fib on anticoagulation, colon cancer, MGUS presented with worsening shortness of breath diagnosed with CHF exacerbation.  Initially at [facility] started on diuretics, seen by cardiology eventually recommended transferring the patient to [facility] for evaluation by CHF team.   Assessment & Plan:   Principal Problem:   Acute on chronic heart failure with preserved ejection fraction (HFpEF) (HCC) Active Problems:   Hypertensive heart disease   Morbid obesity ([place])   OSA (obstructive sleep apnea)   Nonischemic cardiomyopathy ([place])   Anticoagulated on Coumadin   Atrial fibrillation with slow ventricular response ([place])   AKI (acute kidney injury) ([place])   DM (diabetes mellitus), type 2, uncontrolled, with renal complications ([place])   CKD stage 3 due to type 2 diabetes mellitus ([place])   Pulmonary hypertension ([place])   Acute systolic congestive heart failure (HCC)   Acute exacerbation of CHF (congestive heart failure) (HCC)   Bilateral leg edema   Chronic renal failure  Acute congestive heart failure with preserved ejection fraction, 55-60% Cor pulmonale with severe pulmonary hypertension -Diuretics per CHF team. -Daily weight, monitor input and output.  If necessary will place condom catheter -Echocardiogram showed EF of 55 to 60%, right ventricular enlargement and right ventricular systolic failure with severe pulmonary hypertension. -Continue Lipitor, metoprolol  Permanent atrial fibrillation -Rate controlled.  On Coumadin pharmacy to manage -On metoprolol and Cardizem  Acute kidney injury on CKD stage IIIa  -Baseline creatinine 1.5.  Monitor while patient is getting diuresed  Diabetes mellitus type 2, insulin-dependent.  Poorly controlled. -Lantus 18 units at bedtime.   Insulin sliding scale and Accu-Chek.  Obstructive sleep apnea  Anemia -Suspect secondary to underlying renal disease and chronic.  We will check iron studies, TSH, B12 and folate. -Already on iron supplements  GERD -PPI  DVT prophylaxis: Coumadin Code Status: Full Family Communication: None at bedside Disposition Plan:   Patient From= home  Patient Anticipated D/C place= to be determined  Barriers= maintain hospital stay as patient is transferred here from [facility] for CHF team evaluation and diuresis   Subjective: Feels little better this morning after diuresis.  Walked somewhat with physical therapy on 2 L nasal cannula.  At the end of the walk he was somewhat dyspneic.  Review of Systems Otherwise negative except as per HPI, including: General: Denies fever, chills, night sweats or unintended weight loss. Resp: Denies hemoptysis Cardiac: Denies chest pain, palpitations, orthopnea, paroxysmal nocturnal dyspnea. GI: Denies abdominal pain, nausea, vomiting, diarrhea or constipation GU: Denies dysuria, frequency, hesitancy or incontinence MS: Denies muscle aches, joint pain or swelling Neuro: Denies headache, neurologic deficits (focal weakness, numbness, tingling), abnormal gait Psych: Denies anxiety, depression, SI/HI/AVH Skin: Denies new rashes or lesions ID: Denies sick contacts, exotic exposures, travel  Examination:  Constitutional: Not in acute distress, obese Respiratory: Bilateral bibasilar crackles Cardiovascular: Normal sinus rhythm, no rubs Abdomen: Distended abdomen with bowel sounds. Musculoskeletal: 2+ bilateral lower extremity pitting edema Skin: No rashes seen Neurologic: CN 2-12 grossly intact.  And nonfocal Psychiatric: Normal judgment and insight. Alert and oriented x 3. Normal mood.    Objective: Vitals:   04/08/19 1946 04/08/19 2127 04/09/19 0023 04/09/19 0353  BP: (!) 113/58 (!) 126/59 (!) 100/55 (!) 108/53  Pulse: 67  68 (!) 58   Resp: 16  15 16  Temp: 98.3 F (36.8 C)  98 F (36.7 C) 98.1 F (36.7 C)  TempSrc: Oral  Oral Oral  SpO2: 99%  92% 90%  Weight:    127.2 kg  Height:        Intake/Output Summary (Last 24 hours) at 04/09/2019 0814 Last data filed at 04/09/2019 0811 Gross per 24 hour  Intake 1083 ml  Output 2100 ml  Net -1017 ml   Filed Weights   04/07/19 0558 04/08/19 0500 04/09/19 0353  Weight: 127.5 kg 127.5 kg 127.2 kg     Data Reviewed:   CBC: Recent Labs  Lab 04/03/19 0435 04/09/19 0755  WBC 5.4 4.9  HGB 9.2* 9.4*  HCT 34.4* 33.9*  MCV 90.3 88.3  PLT 212 638   Basic Metabolic Panel: Recent Labs  Lab 04/04/19 0448 04/05/19 0452 04/06/19 0552 04/07/19 0538 04/08/19 0418  NA 138 138 138 138 140  K 4.3 4.1 4.0 4.0 4.1  CL 97* 98 96* 97* 97*  CO2 29 30 32 30 31  GLUCOSE 113* 156* 134* 104* 122*  BUN 75* 77* 81* 84* 89*  CREATININE 2.26* 2.28* 2.25* 2.25* 2.47*  CALCIUM 9.1 8.8* 9.0 8.7* 8.9  MG 2.6* 2.4  --  2.5* 2.4   GFR: Estimated Creatinine Clearance: 35.5 mL/min (A) (by C-G formula based on SCr of 2.47 mg/dL (H)). Liver Function Tests: No results for input(s): AST, ALT, ALKPHOS, BILITOT, PROT, ALBUMIN in the last 168 hours. No results for input(s): LIPASE, AMYLASE in the last 168 hours. No results for input(s): AMMONIA in the last 168 hours. Coagulation Profile: Recent Labs  Lab 04/04/19 0448 04/05/19 0452 04/06/19 0552 04/07/19 0538 04/08/19 0418  INR 1.8* 2.2* 2.1* 2.3* 2.4*   Cardiac Enzymes: No results for input(s): CKTOTAL, CKMB, CKMBINDEX, TROPONINI in the last 168 hours. BNP (last 3 results) No results for input(s): PROBNP in the last 8760 hours. HbA1C: No results for input(s): HGBA1C in the last 72 hours. CBG: Recent Labs  Lab 04/08/19 1131 04/08/19 1617 04/08/19 1739 04/08/19 2126 04/09/19 0555  GLUCAP 148* 123* 118* 194* 116*   Lipid Profile: No results for input(s): CHOL, HDL, LDLCALC, TRIG, CHOLHDL, LDLDIRECT in the last 72  hours. Thyroid Function Tests: No results for input(s): TSH, T4TOTAL, FREET4, T3FREE, THYROIDAB in the last 72 hours. Anemia Panel: No results for input(s): VITAMINB12, FOLATE, FERRITIN, TIBC, IRON, RETICCTPCT in the last 72 hours. Sepsis Labs: No results for input(s): PROCALCITON, LATICACIDVEN in the last 168 hours.  No results found for this or any previous visit (from the past 240 hour(s)).       Radiology Studies: No results found.      Scheduled Meds: . allopurinol  300 mg Oral Daily  . atorvastatin  20 mg Oral QPM  . benzonatate  100 mg Oral BID  . diltiazem  180 mg Oral Daily  . ferrous sulfate  325 mg Oral Q breakfast  . insulin aspart  0-15 Units Subcutaneous TID WC  . insulin aspart  0-5 Units Subcutaneous QHS  . insulin glargine  18 Units Subcutaneous QHS  . metoprolol tartrate  50 mg Oral BID  . pantoprazole  40 mg Oral Daily  . polyethylene glycol  17 g Oral Daily  . potassium chloride SA  20 mEq Oral Daily  . sodium chloride flush  3 mL Intravenous Q12H  . Warfarin - Pharmacist Dosing Inpatient   Does not apply q1600   Continuous Infusions: . sodium chloride       LOS:  11 days   Time spent= 35 mins    Larrisha Babineau Arsenio Loader, MD [facility]  If 7PM-7AM, please contact night-coverage  04/09/2019, 8:14 AM

## 2019-04-09 NOTE — Progress Notes (Signed)
Physical Therapy Treatment Patient Details Name: William Flores MRN: [id number] DOB: [birth year] Today's Date: 04/09/2019    History of Present Illness 69 year old male with an ICM, diabetes mellitus type 2, hypertension, CKD, chronic A. fib on anticoagulation, history of colon cancer, MGUS presented with progressive weight gain fluid retention and was markedly volume overloaded with DOE admitted for IV diuresis after failing oral diuretics as outpatient management.    PT Comments    Pt ambulated 62' with RW and supervision, no LOB with RW, reliant on UE support. 2/4 dyspnea noted at rest and with ambulation. SPO2 91% on 2L O2, HR 67 bpm. LE's elevated end of session for swelling. PT will continue to follow.    Follow Up Recommendations  Home health PT;Supervision for mobility/OOB;Supervision - Intermittent     Equipment Recommendations  None recommended by PT    Recommendations for Other Services       Precautions / Restrictions Precautions Precautions: Fall Restrictions Weight Bearing Restrictions: No    Mobility  Bed Mobility Overal bed mobility: Modified Independent             General bed mobility comments: use of rail  Transfers Overall transfer level: Modified independent Equipment used: Rolling walker (2 wheeled)             General transfer comment: increased time and WOB noted with sit<>stand, no physical assist needed  Ambulation/Gait Ambulation/Gait assistance: Supervision Gait Distance (Feet): 60 Feet Assistive device: Rolling walker (2 wheeled) Gait Pattern/deviations: Step-through pattern;Decreased stride length;Trunk flexed;Wide base of support Gait velocity: Decreased   General Gait Details: 2/4 dyspnea noted at rest and with ambulation. SpO2 91% on 2L O2, HR 67 bpm.    Stairs             Wheelchair Mobility    Modified Rankin (Stroke Patients Only)       Balance Overall balance assessment: Mild deficits observed, not  formally tested                                          Cognition Arousal/Alertness: Awake/alert Behavior During Therapy: WFL for tasks assessed/performed Overall Cognitive Status: Within Functional Limits for tasks assessed                                 General Comments: Alert and oriented.      Exercises      General Comments General comments (skin integrity, edema, etc.): pt fatigues quickly with ambulation, he feels that this will be better with decreased fluid. LE's elevated after session for swelling.       Pertinent Vitals/Pain Pain Assessment: No/denies pain    Home Living                      Prior Function            PT Goals (current goals can now be found in the care plan section) Acute Rehab PT Goals Patient Stated Goal: To return to daughter's home PT Goal Formulation: With patient Time For Goal Achievement: 04/13/19 Potential to Achieve Goals: Good Progress towards PT goals: Progressing toward goals    Frequency    Min 3X/week      PT Plan Current plan remains appropriate    Co-evaluation  AM-PAC PT "6 Clicks" Mobility   Outcome Measure  Help needed turning from your back to your side while in a flat bed without using bedrails?: None Help needed moving from lying on your back to sitting on the side of a flat bed without using bedrails?: None Help needed moving to and from a bed to a chair (including a wheelchair)?: A Little Help needed standing up from a chair using your arms (e.g., wheelchair or bedside chair)?: A Little Help needed to walk in hospital room?: A Little Help needed climbing 3-5 steps with a railing? : A Little 6 Click Score: 20    End of Session Equipment Utilized During Treatment: Gait belt;Oxygen Activity Tolerance: Patient tolerated treatment well;No increased pain;Patient limited by fatigue Patient left: in chair;with call bell/phone within reach Nurse  Communication: Mobility status PT Visit Diagnosis: Other abnormalities of gait and mobility (R26.89);Muscle weakness (generalized) (M62.81)     Time: 1610-9604 PT Time Calculation (min) (ACUTE ONLY): 24 min  Charges:  $Gait Training: 8-22 mins $Therapeutic Activity: 8-22 mins                     William Flores, William Flores  Pager [phone] Office William Flores 04/09/2019, 2:19 PM

## 2019-04-09 NOTE — Progress Notes (Signed)
Responded to unit page to provide assistance with AD.  Nurse will give AD document to patient and notify Chaplain when patient is ready or need assistance.  Chaplain available as needed.  Jaclynn Major, [place], [facility], Pager [phone]

## 2019-04-09 NOTE — Consult Note (Addendum)
Advanced Heart Failure Team Consulation Note   PCP:  [name], [name], MD  PCP-Cardiology: Minus Breeding, MD    AHF: Dr. Aundra Dubin   Reason for Consultation: Acute on Chronic Heart Failure (predominately RV failure)   Referring Provider: Dr. Harl Bowie, General Cardiology   HPI:    69 y/o male with history of chronic atrial fibrillation, chronic anticoagulation therapy w/ coumadin, h/o systolic HF (felt to be tachymediated, w/ eventual recovery of LVEF),  chronic diastolic CHF, RV failure w/ pulmonary HTN, Diabetes, Obesity, OHS/OSA w/ poor compliance w/ CPAP, h/o AKI, h/o GIB, colon cancer treated w/ resection and h/o MUGUS. Previously followed in [facility] but lost to f/u and not seen since 2016.     Patient was admitted in 02/2014 with hypoxic respiratory failure with acute systolic CHF and possible PNA.  He was in atrial fibrillation with RVR and developed AKI. Cardiomyopathy (EF 35-40% by echo in 2016) thought to be tachycardia-mediated at the time.  He was admitted again in 4/16 with hypotension and AKI as well as multiple metabolic derangements.  He required CVVH.  Echo at that time showed EF 60-65%.  He was again admitted in 5/16, again with hypoxic respiratory failure and was intubated.  EF 60-65% by echo on this admission.  He again had AKI and had HD x 1. Renal function again recovered. Also, per records, he had a LHC in 2008 that showed no significant CAD.   His last echo prior to this admit was 12/2018 which showed normal LVEF 50-55% w/ moderate LVH, normal LA size, mild MR, severely elevated pulmonary artery systolic pressure, 77 mmHg and moderately reduced right ventricular systolic function. Echo was obtained while admitted for acute anemia 2/2 acute GIB, in the setting of supratherpaeutic INR. INR was > 10.  Hgb 5.1. INR was reversed w/ Kcentra + Vit K. Treated w/ transfusion. Underwent GI w/u. EGD showed chronic gastritis but no clear source of bleeding. Biopsy negative for H Pylori.  Colonoscopy showed several polyps, removed and biopsied. No high grade dysplasia. He was placed back on Coumadin. INRs followed by PCP.   Pt now admitted to [facility] for a/c CHF. Initially presented to ED on 3/24 w/ complaints of worsening dyspnea w/ exertion and at rest as well as significant wt gain, all in the setting of dietary indiscretion w/ sodium. Also w/ AKI on admit. SCr was 1.8 (baseline 1.3-1.5). He was started on IV lasix for diuresis, 60 mg bid. Diuresis sluggish and eventually changed to lasix gtt. SCr has worsened w/ attempts at diuresis, now up 2.3.   Echo repeated. LVEF 55-60%. RV severely enlarged w/ severely reduced RV systolic function. Pulmonary artery systolic pressure severely elevated at 76 mmhg. There is mild TR. Also mild to moderate aortic valve sclerosis/calcification is present, without any evidence of aortic stenosis.  He is now being transferred to [facility] for further management, RHC and formal AHF consultation.   He is currently sitting in chair. No resting dyspnea. Remains markedly volume overloaded w/ R>L symptoms.    _____________________________  2D Echo 04/01/19 Patient has evidence for significant right sided heart failure. 2. Left ventricular ejection fraction, by estimation, is 55 to 60%. The left ventricle has normal function. The left ventricle has no regional wall motion abnormalities. Left ventricular diastolic parameters were normal. 3. Right ventricular systolic function is severely reduced. The right ventricular size is severely enlarged. There is severely elevated pulmonary artery systolic pressure. 4. Left atrial size was moderately dilated. 5. The mitral valve is  normal in structure. Trivial mitral valve regurgitation. No evidence of mitral stenosis. 6. The aortic valve is normal in structure. Aortic valve regurgitation is not visualized. Mild to moderate aortic valve sclerosis/calcification is present, without any evidence of aortic stenosis. 7. The  inferior vena cava is dilated in size with <50% respiratory variability, suggesting right atrial pressure of 15 mmHg.   Review of Systems: [y] = yes, _0  = no   General: Weight gain _1 ; Weight loss _2 ; Anorexia _3 ; Fatigue _4 ; Fever _5 ; Chills _6 ; Weakness _7   Cardiac: Chest pain/pressure _8 ; Resting SOB _9 ; Exertional SOB _10 ; Orthopnea _11 ; Pedal Edema _12 ; Palpitations _13 ; Syncope _14 ; Presyncope _15 ; Paroxysmal nocturnal dyspnea_16   Pulmonary: Cough _17 ; Wheezing_18 ; Hemoptysis_19 ; Sputum _20 ; Snoring _21   GI: Vomiting_22 ; Dysphagia_23 ; Melena_24 ; Hematochezia _25 ; Heartburn_26 ; Abdominal pain _27 ; Constipation _28 ; Diarrhea _29 ; BRBPR _30   GU: Hematuria_31 ; Dysuria _32 ; Nocturia_33   Vascular: Pain in legs with walking _34 ; Pain in feet with lying flat _35 ; Non-healing sores _36 ; Stroke _37 ; TIA _38 ; Slurred speech _39 ;  Neuro: Headaches_40 ; Vertigo_41 ; Seizures_42 ; Paresthesias_43 ;Blurred vision _44 ; Diplopia _45 ; Vision changes _46   Ortho/Skin: Arthritis _47 ; Joint pain _48 ; Muscle pain _49 ; Joint swelling _50 ; Back Pain _51 ; Rash _52   Psych: Depression_53 ; Anxiety_54   Heme: Bleeding problems _55 ; Clotting disorders _56 ; Anemia _57   Endocrine: Diabetes _58 ; Thyroid dysfunction_59    Home Medications Prior to Admission medications   Medication Sig Start Date End Date Taking? Authorizing Provider  acetaminophen (TYLENOL) 325 MG tablet Take 2 tablets (650 mg total) by mouth every 6 (six) hours as needed for mild pain (or Fever >/= 101). 01/01/19  Yes Emokpae, Courage, MD  allopurinol (ZYLOPRIM) 300 MG tablet TAKE 1 TABLET BY MOUTH EVERY DAY 01/28/19  Yes [name], [name], MD  atorvastatin (LIPITOR) 20 MG tablet 1 tablet daily 01/28/19  Yes [name], [name], MD  azelastine (OPTIVAR) 0.05 % ophthalmic solution PLACE 2 DROPS INTO BOTH EYES 2 (TWO) TIMES DAILY. Patient taking differently: Place 2 drops into the right eye 2 (two) times daily as needed (dry eye from allergies).   07/30/18  Yes [name], [name], MD  benzonatate (TESSALON PERLES) 100 MG capsule Take 1 capsule (100 mg total) by mouth 2 (two) times daily. Patient taking differently: Take 100 mg by mouth 2 (two) times daily as needed.  03/08/19  Yes [name], [name], MD  DILT-XR 180 MG 24 hr capsule TAKE 1 CAPSULE BY MOUTH EVERY DAY Patient taking differently: Take 180 mg by mouth in the morning and at bedtime.  01/28/19  Yes [name], [name], MD  FARXIGA 5 MG TABS tablet Take 5 mg by mouth daily. 01/26/19  Yes [provider]  ferrous sulfate 325 (65 FE) MG tablet Take 325 mg by mouth daily with breakfast.    Yes [provider]  furosemide (LASIX) 80 MG tablet Take 1 tablet (80 mg total) by mouth daily. 01/28/19  Yes [name], [name], MD  insulin aspart (NOVOLOG FLEXPEN) 100 UNIT/ML FlexPen insulin aspart (novoLOG) injection 0-9 Units 0-9 Units, Subcutaneous, 3 times daily with meals, CBG < 70: Implement Hypoglycemia Standing Orders and refer to Hypoglycemia Standing Orders sidebar report CBG 70 - 120:  0 units CBG 121 - 150: 1 unit CBG 151 - 200: 2 units CBG 201 - 250: 3 units CBG 251 - 300: 5 units CBG 301 - 350: 7 units CBG 351 - 400: 9 units CBG > 400 Patient taking differently: Inject 9 Units into the skin in the morning and at bedtime. insulin aspart (novoLOG) injection 0-9 Units 0-9 Units, Subcutaneous, 3 times daily with meals, CBG < 70: Implement Hypoglycemia Standing Orders and refer to Hypoglycemia Standing Orders sidebar report CBG 70 - 120: 0 units CBG 121 - 150: 1 unit CBG 151 - 200: 2 units CBG 201 - 250: 3 units CBG 251 - 300: 5 units CBG 301 - 350: 7 units CBG 351 - 400: 9 units CBG > 400 02/06/19  Yes [name], [name], MD  insulin degludec (TRESIBA FLEXTOUCH) 100 UNIT/ML SOPN FlexTouch Pen Inject 0.18 mLs (18 Units total) into the skin at bedtime. 01/28/19  Yes [name], [name], MD  losartan (COZAAR) 100 MG tablet Take 1 tablet (100 mg total) by mouth daily. 01/28/19  Yes  [name], [name], MD  metFORMIN (GLUCOPHAGE) 500 MG tablet Take 1 tablet (500 mg total) by mouth 2 (two) times daily with a meal. 02/06/19  Yes [name], [name], MD  metoprolol tartrate (LOPRESSOR) 50 MG tablet Take 1 tablet (50 mg total) by mouth 2 (two) times daily. 01/28/19  Yes [name], [name], MD  pantoprazole (PROTONIX) 40 MG tablet Take 1 tablet (40 mg total) by mouth daily. Patient taking differently: Take 40 mg by mouth daily as needed.  01/28/19  Yes [name], [name], MD  polyethylene glycol (MIRALAX / GLYCOLAX) 17 g packet Take 17 g by mouth daily. Patient taking differently: Take 17 g by mouth daily as needed.  01/01/19  Yes Emokpae, Courage, MD  potassium chloride SA (KLOR-CON M20) 20 MEQ tablet TAKE 1/2 TABLET (10 MEQ TOTAL) BY MOUTH DAILY. NEED OV. *INS WILL COVER 4/19 OR LATER* 01/28/19  Yes [name], [name], MD  TRULICITY 1.5 KV/4.2VZ SOPN INJECT 1.5 MG INTO THE SKIN ONCE A WEEK. 01/28/19  Yes [name], [name], MD  warfarin (COUMADIN) 5 MG tablet Take 1 tablet (5 mg total) by mouth every evening. Patient taking differently: Take 5 mg by mouth every evening. 12/2018: directions were "take as directed". 01/2019: directions were "take 1 tab po qd". Patient has been taking 1 tablet every other day, alternating 1.5 tabs on the other days. 01/28/19  Yes [name], [name], MD  Insulin Pen Needle 32G X 4 MM MISC Use to give insulin daily Dx E11.29 Patient not taking: Reported on 03/30/2019 01/28/19   [name], [name], MD  polyvinyl alcohol (LIQUIFILM TEARS) 1.4 % ophthalmic solution Place 1 drop into both eyes as needed for dry eyes. Patient not taking: Reported on 03/30/2019 01/28/19   [name], [name], MD  diltiazem [facility]) 240 MG 24 hr capsule TAKE 1 CAPSULE DAILY 10/19/12 02/17/13  Vernie Shanks, MD    Past Medical History: Past Medical History:  Diagnosis Date  . CAD (coronary artery disease)    LHC 2/08:  mRCA 50%, o/w no sig CAD, EF 25%  . Cardiomyopathy ([place])     EF 35-40% in 2/16 in setting of AF with RVR >> echo 5/16 with improved LVF with EF 65-70%  . Chronic atrial fibrillation ([place])   . Chronic diastolic heart failure (HCC)    HFPF - EF ~65-70% (OFTEN EXACERBATED BY AFIB)  . CKD (chronic kidney disease)  hx of worsening renal failure and hyperK+ in setting of acute diast CHF >> required CVVHD in 4/16 and dialysis x 1 in 5/16  . CKD stage 3 due to type 2 diabetes mellitus ([place]) 09/17/2015  . Colon cancer ([place])   . Colon polyps 09/21/2012   Tubular adenoma  . Diabetes ([place])   . History of echocardiogram    Echo 05/16/14:  mild LVH, vigorous LVF, EF 65-70%, no RWMA, mean AV gradient 9 mmHg, MAC, mild MR, mod LAE, mild RVE, mild reduced RVF, severe RAE  . Hyperlipidemia   . Hypertension   . Obesity hypoventilation syndrome ([place])   . Renal insufficiency   . Sleep apnea     Past Surgical History: Past Surgical History:  Procedure Laterality Date  . CIRCUMCISION    . COLON RESECTION    . COLONOSCOPY N/A 09/21/2012   Procedure: COLONOSCOPY;  Surgeon: Inda Castle, MD;  Location: [facility] ENDOSCOPY;  Service: Endoscopy;  Laterality: N/A;  . COLONOSCOPY N/A 12/28/2018   Procedure: COLONOSCOPY;  Surgeon: Rogene Houston, MD;  Location: [facility] ENDO SUITE;  Service: Endoscopy;  Laterality: N/A;  . ESOPHAGOGASTRODUODENOSCOPY N/A 12/28/2018   Procedure: ESOPHAGOGASTRODUODENOSCOPY (EGD);  Surgeon: Rogene Houston, MD;  Location: [facility] ENDO SUITE;  Service: Endoscopy;  Laterality: N/A;  . US ECHOCARDIOGRAPHY  03/04/2010   abnormal study    Family History:  Family History  Problem Relation Age of Onset  . Breast cancer Mother   . Diabetes Mother   . Heart attack Father     Social History: Social History   Socioeconomic History  . Marital status: Divorced    Spouse name: Not on file  . Number of children: 1  . Years of education: Not on file  . Highest education level: Not on file  Occupational History    Employer: [employer]  Tobacco  Use  . Smoking status: Former Smoker    Quit date: 08/06/1983    Years since quitting: 35.6  . Smokeless tobacco: Never Used  Substance and Sexual Activity  . Alcohol use: No  . Drug use: No  . Sexual activity: Not on file  Other Topics Concern  . Not on file  Social History Narrative  . Not on file   Social Determinants of Health   Financial Resource Strain:   . Difficulty of Paying Living Expenses:   Food Insecurity:   . Worried About Charity fundraiser in the Last Year:   . Arboriculturist in the Last Year:   Transportation Needs:   . Film/video editor (Medical):   Marland Kitchen Lack of Transportation (Non-Medical):   Physical Activity:   . Days of Exercise per Week:   . Minutes of Exercise per Session:   Stress:   . Feeling of Stress :   Social Connections:   . Frequency of Communication with Friends and Family:   . Frequency of Social Gatherings with Friends and Family:   . Attends Religious Services:   . Active Member of Clubs or Organizations:   . Attends Archivist Meetings:   Marland Kitchen Marital Status:     Allergies:  Allergies  Allergen Reactions  . Codeine     Headache     Objective:    Vital Signs:   Temp:  [97.5 F (36.4 C)-98.5 F (36.9 C)] 98.5 F (36.9 C) (04/05 1427) Pulse Rate:  [49-69] 69 (04/05 1441) Resp:  [18-20] 20 (04/05 1427) BP: (74-120)/(35-71) 120/54 (04/05 1441) SpO2:  [89 %-96 %]  94 % (04/05 1441) Weight:  [127.5 kg] 127.5 kg (04/05 0500) Last BM Date: 04/07/19 Filed Weights   04/06/19 0300 04/07/19 0558 04/08/19 0500  Weight: 127.7 kg 127.5 kg 127.5 kg     Physical Exam     General:  Obese WM sitting up in chair. No respiratory difficulty HEENT: Normal Neck: Supple. Elevated JVD to ear. Carotids 2+ bilat; no bruits. No lymphadenopathy or thyromegaly appreciated. Cor: PMI nondisplaced. Irregularly irregular rhythm, regular rate. No rubs, gallops or murmurs. Lungs: decreased BS at the bases, no wheezing  Abdomen: obese,  distended, nontender. No hepatosplenomegaly. No bruits or masses. Good bowel sounds. Extremities: No cyanosis, clubbing, rash, 2+ bilateral tense LE edema Neuro: Alert & oriented x 3, cranial nerves grossly intact. moves all 4 extremities w/o difficulty. Affect pleasant.   Telemetry   Chronic afib w/ slow- controlled VR, HR 50s-60s   EKG   Chronic afib, 89 bpm. Low voltage   Labs     Basic Metabolic Panel: Recent Labs  Lab 04/02/19 0800 04/03/19 0435 04/04/19 0448 04/04/19 0448 04/05/19 0452 04/05/19 0452 04/06/19 0552 04/07/19 0538 04/08/19 0418  NA 136   < > 138  --  138  --  138 138 140  K 4.9   < > 4.3  --  4.1  --  4.0 4.0 4.1  CL 101   < > 97*  --  98  --  96* 97* 97*  CO2 25   < > 29  --  30  --  32 30 31  GLUCOSE 165*   < > 113*  --  156*  --  134* 104* 122*  BUN 65*   < > 75*  --  77*  --  81* 84* 89*  CREATININE 2.04*   < > 2.26*  --  2.28*  --  2.25* 2.25* 2.47*  CALCIUM 8.6*   < > 9.1   < > 8.8*   < > 9.0 8.7* 8.9  MG  --   --  2.6*  --  2.4  --   --  2.5* 2.4  PHOS 5.2*  --   --   --   --   --   --   --   --    < > = values in this interval not displayed.    Liver Function Tests: Recent Labs  Lab 04/02/19 0800  ALBUMIN 3.4*   No results for input(s): LIPASE, AMYLASE in the last 168 hours. No results for input(s): AMMONIA in the last 168 hours.  CBC: Recent Labs  Lab 04/03/19 0435  WBC 5.4  HGB 9.2*  HCT 34.4*  MCV 90.3  PLT 212    Cardiac Enzymes: No results for input(s): CKTOTAL, CKMB, CKMBINDEX, TROPONINI in the last 168 hours.  BNP: BNP (last 3 results) Recent Labs    12/25/18 1902 03/28/19 0912  BNP 291.0* 276.0*    ProBNP (last 3 results) No results for input(s): PROBNP in the last 8760 hours.   CBG: Recent Labs  Lab 04/07/19 1154 04/07/19 1630 04/07/19 2100 04/08/19 0801 04/08/19 1131  GLUCAP 145* 128* 144* 97 148*    Coagulation Studies: Recent Labs    04/06/19 0552 04/07/19 0538 04/08/19 0418  LABPROT  23.8* 25.4* 26.1*  INR 2.1* 2.3* 2.4*    Imaging:  No results found.   Assessment/Plan   1. Acute on Chronic Heart Failure/ Prominent RV Failure -Chronic diastolic HF w/ known RV failure. Echo this admit w/ LVEF 50-55%. RV  severely dilated w/ severe RV dysfunction, in the setting of severe pulmonary hypertension w/ PASP 76 mmHg.  - poor response to IV diuretics w/ worsening SCr, now up to 2.3 (baseline SCr 1.3-1.5), likely cardiorenal  - needs RHC for further evaluation. Discussed w/ Dr. Aundra Dubin. Plan 4/8  - suspect he may need dobutamine for RV support to help push diuresis  - with RV failure, he would not be a candidate for HD. Consider placement of PICC line to check Co-ox and monitor CVP - Will give 120 mg of IV Lasix today and observe.  - suspect RV failure primarily 2/2 pulmonary HTN, likely caused by untreated OSA/ OHS. However w/ low voltage on EKG, MUGUS, conduction disease (chronic afib) and aortic valve sclerosis, may also consider w/u to r/o amyloid. Check multiple myeloma panel.    2. Pulmonary HTN - PASP elevated at 76 mmgh by echo estimate. RV systolic function severely reduced - Will need RHC to better assess, but suspect primarily WHO Group 3 from untreated OSA/OHS (poor compliance w/ CPAP) - needs to improve compliance w/ CPAP at night. Continue daytime O2 - consider V/Q scan and PFTs  3. AKI - baseline SCr 1.3-1.5. 1.8 on admit. - SCr has worsened w/ attempts at diuresis, now up to 2.3 and now oliguric  - suspect cardiorenal 2/2 severe RV failure - may need trial of inotropic therapy w/ dobutamine and continuation of high dose IV diuretics  - hopefully renal function will improve w/ introptes/diuretics - given severe RV dysfunction, would be a poor candidate for HD  - continue to hold losartan   4. Chronic Atrial Fibrillation  - recommend stopping Cardizem given RV dysfunction  - may also need to hold  blocker, given suspected low output.  - can use  amiodarone for rate control if started on inotrope - keep K >4.0 and Mg > 2.0 - continue coumadin for a/c. INR therapeutic at 2.4.  PharmD to follow   5. OSA/OHS - noncompliant w/ CPAP - needs to improve compliance  6. Diabetes - Metformin on hold given AKI - cover w/ SSI   7. MUGUS  - noted in HPI. ? Progression to MM. ? Amyloid - check multiple myeloma panel     Lyda Jester, PA-C 04/08/2019, 2:52 PM  Advanced Heart Failure Team Pager [phone] (M-F; 7a - 4p)  Please contact [facility] for night-coverage after hours (4p -7a ) and weekends on [email or web address]  Patient seen and examined with the above-signed Advanced Practice Provider and/or Housestaff. I personally reviewed laboratory data, imaging studies and relevant notes. I independently examined the patient and formulated the important aspects of the plan. I have edited the note to reflect any of my changes or salient points. I have personally discussed the plan with the patient and/or family.  69 y/o male with h/o systolic HF with recovered EF, obesity, OHS/OSA (loved CPAP but machine broken x 6 months), DM2, CKD 3b and chronic AF.  Admitted with several week h/o progress RV failure with marked volume overload not responding to oral diuretics and AKI (Cr 1.6 -> 2.3)  Echo shows normal LV function with markedly dilated RV and moderately decreased RV function. RVSP ~75-72mHG. Personally reviewed  Now responding to high-dose IV lasix  On exam General:  Obese male sitting on end of bed  No resp difficulty HEENT: normal Neck: supple. JVP to ear Carotids 2+ bilat; no bruits. No lymphadenopathy or thryomegaly appreciated. Cor: PMI nondisplaced. Irr. IRR  No rubs, gallops or murmurs. Lungs:  crackles at bases  Abdomen: soft, nontender, ++ distended. No hepatosplenomegaly. No bruits or masses. Good bowel sounds. Extremities: no cyanosis, clubbing, rash, 2+ edema Neuro: alert & orientedx3, cranial nerves grossly intact. moves  all 4 extremities w/o difficulty. Affect pleasant  Echo reviewed personally. Suspect he has severe PAH/cor pulmonale in setting of untreated OSA/OHS. Now complicated by significant volume overload and AKI.   Would wrap legs and continue to diurese with high-dose lasix. Once further diuresed (or if diuresis stops) will need RHC.   Has arrangements for new CPAP machine at home. Stressed need for weight loss and compliance with supplemental O2 and sliding scale diuretic regimen. Given h/o MGUS agree with repeating myeloma w/u but echo does not seem c/w cardiac amyloid.   Ok to continue coumadin for AF. Given need for RHC would run INR on low end of range.   Glori Bickers, MD  7:13 PM

## 2019-04-09 NOTE — Progress Notes (Signed)
[facility] for warfarin Indication: atrial fibrillation  Allergies  Allergen Reactions  . Codeine     Headache     Patient Measurements: Height: _0  (162.6 cm) Weight: 127.2 kg (280 lb 6.4 oz) IBW/kg (Calculated) : 59.2  Vital Signs: Temp: 98.1 F (36.7 C) (04/06 0353) Temp Source: Oral (04/06 0353) BP: 108/53 (04/06 0353) Pulse Rate: 58 (04/06 0353)  Labs: Recent Labs    04/07/19 0538 04/08/19 0418 04/09/19 0755  HGB  --   --  9.4*  HCT  --   --  33.9*  PLT  --   --  163  LABPROT 25.4* 26.1* 26.0*  INR 2.3* 2.4* 2.4*  CREATININE 2.25* 2.47* 2.33*    Estimated Creatinine Clearance: 37.6 mL/min (A) (by C-G formula based on SCr of 2.33 mg/dL (H)).   Medical History: Past Medical History:  Diagnosis Date  . CAD (coronary artery disease)    LHC 2/08:  mRCA 50%, o/w no sig CAD, EF 25%  . Cardiomyopathy ([place])    EF 35-40% in 2/16 in setting of AF with RVR >> echo 5/16 with improved LVF with EF 65-70%  . Chronic atrial fibrillation ([place])   . Chronic diastolic heart failure (HCC)    HFPF - EF ~65-70% (OFTEN EXACERBATED BY AFIB)  . CKD (chronic kidney disease)    hx of worsening renal failure and hyperK+ in setting of acute diast CHF >> required CVVHD in 4/16 and dialysis x 1 in 5/16  . CKD stage 3 due to type 2 diabetes mellitus ([place]) 09/17/2015  . Colon cancer ([place])   . Colon polyps 09/21/2012   Tubular adenoma  . Diabetes ([place])   . History of echocardiogram    Echo 05/16/14:  mild LVH, vigorous LVF, EF 65-70%, no RWMA, mean AV gradient 9 mmHg, MAC, mild MR, mod LAE, mild RVE, mild reduced RVF, severe RAE  . Hyperlipidemia   . Hypertension   . Obesity hypoventilation syndrome ([place])   . Renal insufficiency   . Sleep apnea     Medications:  Medications Prior to Admission  Medication Sig Dispense Refill Last Dose  . acetaminophen (TYLENOL) 325 MG tablet Take 2 tablets (650 mg total) by mouth every 6 (six) hours as needed  for mild pain (or Fever >/= 101). 12 tablet 0 03/26/2019  . allopurinol (ZYLOPRIM) 300 MG tablet TAKE 1 TABLET BY MOUTH EVERY DAY 90 tablet 0 03/28/2019  . atorvastatin (LIPITOR) 20 MG tablet 1 tablet daily 90 tablet 3 03/28/2019  . azelastine (OPTIVAR) 0.05 % ophthalmic solution PLACE 2 DROPS INTO BOTH EYES 2 (TWO) TIMES DAILY. (Patient taking differently: Place 2 drops into the right eye 2 (two) times daily as needed (dry eye from allergies). ) 6 mL 5 03/28/2019  . benzonatate (TESSALON PERLES) 100 MG capsule Take 1 capsule (100 mg total) by mouth 2 (two) times daily. (Patient taking differently: Take 100 mg by mouth 2 (two) times daily as needed. ) 60 capsule 3 03/28/2019  . DILT-XR 180 MG 24 hr capsule TAKE 1 CAPSULE BY MOUTH EVERY DAY (Patient taking differently: Take 180 mg by mouth in the morning and at bedtime. ) 90 capsule 0 03/28/2019  . FARXIGA 5 MG TABS tablet Take 5 mg by mouth daily.   03/28/2019  . ferrous sulfate 325 (65 FE) MG tablet Take 325 mg by mouth daily with breakfast.    03/28/2019  . furosemide (LASIX) 80 MG tablet Take 1 tablet (80 mg total) by  mouth daily. 30 tablet 2 03/28/2019  . insulin aspart (NOVOLOG FLEXPEN) 100 UNIT/ML FlexPen insulin aspart (novoLOG) injection 0-9 Units 0-9 Units, Subcutaneous, 3 times daily with meals, CBG < 70: Implement Hypoglycemia Standing Orders and refer to Hypoglycemia Standing Orders sidebar report CBG 70 - 120: 0 units CBG 121 - 150: 1 unit CBG 151 - 200: 2 units CBG 201 - 250: 3 units CBG 251 - 300: 5 units CBG 301 - 350: 7 units CBG 351 - 400: 9 units CBG > 400 (Patient taking differently: Inject 9 Units into the skin in the morning and at bedtime. insulin aspart (novoLOG) injection 0-9 Units 0-9 Units, Subcutaneous, 3 times daily with meals, CBG < 70: Implement Hypoglycemia Standing Orders and refer to Hypoglycemia Standing Orders sidebar report CBG 70 - 120: 0 units CBG 121 - 150: 1 unit CBG 151 - 200: 2 units CBG 201 - 250: 3 units CBG 251 - 300: 5  units CBG 301 - 350: 7 units CBG 351 - 400: 9 units CBG > 400) 15 mL 11 03/28/2019  . insulin degludec (TRESIBA FLEXTOUCH) 100 UNIT/ML SOPN FlexTouch Pen Inject 0.18 mLs (18 Units total) into the skin at bedtime. 15 pen 1 03/28/2019  . losartan (COZAAR) 100 MG tablet Take 1 tablet (100 mg total) by mouth daily. 90 tablet 0 03/28/2019  . metFORMIN (GLUCOPHAGE) 500 MG tablet Take 1 tablet (500 mg total) by mouth 2 (two) times daily with a meal. 180 tablet 1 03/28/2019  . metoprolol tartrate (LOPRESSOR) 50 MG tablet Take 1 tablet (50 mg total) by mouth 2 (two) times daily. 180 tablet 3 03/28/2019  . pantoprazole (PROTONIX) 40 MG tablet Take 1 tablet (40 mg total) by mouth daily. (Patient taking differently: Take 40 mg by mouth daily as needed. ) 30 tablet 5 03/27/2019  . polyethylene glycol (MIRALAX / GLYCOLAX) 17 g packet Take 17 g by mouth daily. (Patient taking differently: Take 17 g by mouth daily as needed. ) 14 each 0 03/25/2019  . potassium chloride SA (KLOR-CON M20) 20 MEQ tablet TAKE 1/2 TABLET (10 MEQ TOTAL) BY MOUTH DAILY. NEED OV. *INS WILL COVER 4/19 OR LATER* 45 tablet 1 03/28/2019  . TRULICITY 1.5 YQ/0.3KV SOPN INJECT 1.5 MG INTO THE SKIN ONCE A WEEK. 6 mL 0 03/22/2019  . warfarin (COUMADIN) 5 MG tablet Take 1 tablet (5 mg total) by mouth every evening. (Patient taking differently: Take 5 mg by mouth every evening. 12/2018: directions were "take as directed". 01/2019: directions were "take 1 tab po qd". Patient has been taking 1 tablet every other day, alternating 1.5 tabs on the other days.) 30 tablet 3 03/28/2019  . Insulin Pen Needle 32G X 4 MM MISC Use to give insulin daily Dx E11.29 (Patient not taking: Reported on 03/30/2019) 100 each 3 Not Taking at Unknown time  . polyvinyl alcohol (LIQUIFILM TEARS) 1.4 % ophthalmic solution Place 1 drop into both eyes as needed for dry eyes. (Patient not taking: Reported on 03/30/2019) 15 mL 0 Not Taking at Unknown time    Assessment: Pharmacy consulted to  dose warfarin in patient with atrial fibrillation.  Patient's home dose listed as alternating 5 mg and 7.5 mg doses.  INR today is 2.4  Goal of Therapy:  INR 2-3 Monitor platelets by anticoagulation protocol: Yes   Plan:  Warfarin 5 mg x 1 today (back on home regiment) Monitor daily INR and s/s of bleeding.  Vertis Kelch, PharmD, [facility] PGY2 Cardiology Pharmacy Resident Phone [phone] 04/09/2019  9:50 AM  Please check [email or web address] for unit-specific pharmacist phone numbers

## 2019-04-10 ENCOUNTER — Inpatient Hospital Stay (HOSPITAL_COMMUNITY): Payer: Medicare Other

## 2019-04-10 LAB — BASIC METABOLIC PANEL
Anion gap: 9 (ref 5–15)
BUN: 88 mg/dL — ABNORMAL HIGH (ref 8–23)
CO2: 32 mmol/L (ref 22–32)
Calcium: 8.6 mg/dL — ABNORMAL LOW (ref 8.9–10.3)
Chloride: 100 mmol/L (ref 98–111)
Creatinine, Ser: 2.26 mg/dL — ABNORMAL HIGH (ref 0.61–1.24)
GFR calc Af Amer: 34 mL/min — ABNORMAL LOW (ref >60)
GFR calc non Af Amer: 29 mL/min — ABNORMAL LOW (ref >60)
Glucose, Bld: 113 mg/dL — ABNORMAL HIGH (ref 70–99)
Potassium: 4.2 mmol/L (ref 3.5–5.1)
Sodium: 141 mmol/L (ref 135–145)

## 2019-04-10 LAB — CBC
HCT: 34.1 % — ABNORMAL LOW (ref 39.0–52.0)
Hemoglobin: 9.4 g/dL — ABNORMAL LOW (ref 13.0–17.0)
MCH: 24.3 pg — ABNORMAL LOW (ref 26.0–34.0)
MCHC: 27.6 g/dL — ABNORMAL LOW (ref 30.0–36.0)
MCV: 88.1 fL (ref 80.0–100.0)
Platelets: 143 10*3/uL — ABNORMAL LOW (ref 150–400)
RBC: 3.87 MIL/uL — ABNORMAL LOW (ref 4.22–5.81)
RDW: 20.2 % — ABNORMAL HIGH (ref 11.5–15.5)
WBC: 4.2 10*3/uL (ref 4.0–10.5)
nRBC: 0 % (ref 0.0–0.2)

## 2019-04-10 LAB — GLUCOSE, CAPILLARY
Glucose-Capillary: 78 mg/dL (ref 70–99)
Glucose-Capillary: 121 mg/dL — ABNORMAL HIGH (ref 70–99)
Glucose-Capillary: 99 mg/dL (ref 70–99)
Glucose-Capillary: 198 mg/dL — ABNORMAL HIGH (ref 70–99)

## 2019-04-10 LAB — PROTIME-INR
INR: 2.6 — ABNORMAL HIGH (ref 0.8–1.2)
Prothrombin Time: 28.1 seconds — ABNORMAL HIGH (ref 11.4–15.2)

## 2019-04-10 LAB — FERRITIN: Ferritin: 52 ng/mL (ref 24–336)

## 2019-04-10 LAB — T4, FREE: Free T4: 0.74 ng/dL (ref 0.61–1.12)

## 2019-04-10 LAB — MAGNESIUM: Magnesium: 2.4 mg/dL (ref 1.7–2.4)

## 2019-04-10 MED ORDER — SODIUM CHLORIDE 0.9% FLUSH
3.0000 mL | Freq: Two times a day (BID) | INTRAVENOUS | Status: DC
  Administered 2019-04-10 – 2019-04-15 (×5): 3 mL via INTRAVENOUS

## 2019-04-10 MED ORDER — WARFARIN SODIUM 5 MG PO TABS
5.0000 mg | ORAL_TABLET | Freq: Once | ORAL | Status: AC
  Administered 2019-04-10: 5 mg via ORAL
  Filled 2019-04-10: qty 1

## 2019-04-10 MED ORDER — TECHNETIUM TO 99M ALBUMIN AGGREGATED
1.8000 | Freq: Once | INTRAVENOUS | Status: AC | PRN
  Administered 2019-04-10: 1.8 via INTRAVENOUS

## 2019-04-10 MED ORDER — DIPHENHYDRAMINE HCL 25 MG PO CAPS
25.0000 mg | ORAL_CAPSULE | Freq: Once | ORAL | Status: AC
  Administered 2019-04-10: 25 mg via ORAL
  Filled 2019-04-10: qty 1

## 2019-04-10 MED ORDER — METOPROLOL SUCCINATE ER 50 MG PO TB24
50.0000 mg | ORAL_TABLET | Freq: Every day | ORAL | Status: DC
  Administered 2019-04-10 – 2019-04-15 (×6): 50 mg via ORAL
  Filled 2019-04-10 (×6): qty 1

## 2019-04-10 NOTE — Progress Notes (Signed)
Patient ID: William Flores, male   DOB: 01/30/1950, 69 y.o.   MRN: [id number]     Advanced Heart Failure Rounding Note  PCP-Cardiologist: Minus Breeding, MD   Subjective:    Patient diuresed reasonably well last night with IV Lasix bolus.  Creatinine 2.33 => 2.26.  He remains in atrial fibrillation with HR in 60s.   Echo: EF 55-60%, severely enlarged RV with severe systolic dysfunction, PASP 76 mmHg.    Objective:   Weight Range: 127 kg Body mass index is 48.06 kg/m.   Vital Signs:   Temp:  [97.4 F (36.3 C)-98 F (36.7 C)] 97.5 F (36.4 C) (04/07 0322) Pulse Rate:  [63-80] 80 (04/07 0842) Resp:  [17-20] 20 (04/07 0322) BP: (99-125)/(65-67) 125/66 (04/07 0842) SpO2:  [90 %-95 %] 90 % (04/07 0322) Weight:  [786 kg] 127 kg (04/07 0115) Last BM Date: 04/09/19  Weight change: Filed Weights   04/08/19 0500 04/09/19 0353 04/10/19 0115  Weight: 127.5 kg 127.2 kg 127 kg    Intake/Output:   Intake/Output Summary (Last 24 hours) at 04/10/2019 0846 Last data filed at 04/10/2019 0630 Gross per 24 hour  Intake 1501 ml  Output 3250 ml  Net -1749 ml      Physical Exam    General:  Well appearing. No resp difficulty HEENT: Normal Neck: Thick. JVP 14-16 cm. Carotids 2+ bilat; no bruits. No lymphadenopathy or thyromegaly appreciated. Cor: PMI nondisplaced. Regular rate & rhythm. 2/6 early SEM RUSB Lungs: Clear Abdomen: Soft, nontender, moderately distended. No hepatosplenomegaly. No bruits or masses. Good bowel sounds. Extremities: No cyanosis, clubbing, rash. 1+ edema to knees Neuro: Alert & orientedx3, cranial nerves grossly intact. moves all 4 extremities w/o difficulty. Affect pleasant   Telemetry   Atrial fibrillation 60s (personally reviewed)  Labs    CBC Recent Labs    04/09/19 0755 04/10/19 0411  WBC 4.9 4.2  HGB 9.4* 9.4*  HCT 33.9* 34.1*  MCV 88.3 88.1  PLT 163 767*   Basic Metabolic Panel Recent Labs    04/08/19 0418 04/08/19 0418 04/09/19 0755  04/10/19 0411  NA 140   < > 141 141  K 4.1   < > 4.1 4.2  CL 97*   < > 96* 100  CO2 31   < > 32 32  GLUCOSE 122*   < > 112* 113*  BUN 89*   < > 87* 88*  CREATININE 2.47*   < > 2.33* 2.26*  CALCIUM 8.9   < > 8.6* 8.6*  MG 2.4  --   --  2.4   < > = values in this interval not displayed.   Liver Function Tests No results for input(s): AST, ALT, ALKPHOS, BILITOT, PROT, ALBUMIN in the last 72 hours. No results for input(s): LIPASE, AMYLASE in the last 72 hours. Cardiac Enzymes No results for input(s): CKTOTAL, CKMB, CKMBINDEX, TROPONINI in the last 72 hours.  BNP: BNP (last 3 results) Recent Labs    12/25/18 1902 03/28/19 0912  BNP 291.0* 276.0*    ProBNP (last 3 results) No results for input(s): PROBNP in the last 8760 hours.   D-Dimer No results for input(s): DDIMER in the last 72 hours. Hemoglobin A1C No results for input(s): HGBA1C in the last 72 hours. Fasting Lipid Panel No results for input(s): CHOL, HDL, LDLCALC, TRIG, CHOLHDL, LDLDIRECT in the last 72 hours. Thyroid Function Tests Recent Labs    04/09/19 0920  TSH 6.698*    Other results:   Imaging  No results found.   Medications:     Scheduled Medications: . allopurinol  300 mg Oral Daily  . atorvastatin  20 mg Oral QPM  . benzonatate  100 mg Oral BID  . ferrous sulfate  325 mg Oral Q breakfast  . insulin aspart  0-15 Units Subcutaneous TID WC  . insulin aspart  0-5 Units Subcutaneous QHS  . insulin glargine  18 Units Subcutaneous QHS  . metoprolol succinate  50 mg Oral Daily  . pantoprazole  40 mg Oral Daily  . polyethylene glycol  17 g Oral Daily  . potassium chloride SA  20 mEq Oral Daily  . sodium chloride flush  3 mL Intravenous Q12H  . warfarin  5 mg Oral ONCE-1600  . Warfarin - Pharmacist Dosing Inpatient   Does not apply q1600     Infusions: . sodium chloride    . ferumoxytol Stopped (04/09/19 1855)  . furosemide 120 mg (04/10/19 0837)     PRN Medications:  sodium  chloride, acetaminophen **OR** acetaminophen, acetaminophen, guaiFENesin-dextromethorphan, ondansetron **OR** ondansetron (ZOFRAN) IV, polyethylene glycol, polyvinyl alcohol, senna-docusate, sodium chloride flush, traZODone   Assessment/Plan   1. Cor pulmonale/RV failure: Echo with EF 55-60%, RV severely dilated w/ severe RV dysfunction in the setting of severe pulmonary hypertension w/ PASP 76 mmHg. Suspect OHS/OSA.  AKI with creatinine > 2 from baseline 1.3-1.5.  On exam, he remains volume overloaded.  He appears to have diuresed well overnight and creatinine is mildly lower today.  - Continue Lasix 120 mg IV bid for now.  - May need inotrope for RV support while diuresing, but as long as creatinine decreases and he diureses well would not start.  - Will get RHC after further diuresis to evaluate cardiac output, PA pressure.  2. Pulmonary HTN: PASP elevated at 76 mmHg by echo estimate. RV systolic function severely reduced. Most likely poorly controlled OHS/OSA as cause (group 3 PH).  Has not had CPAP at home for months but has been using home oxygen.  - Continue home oxygen and restart CPAP.  - Eventual RHC as above.  - Reasonable to get V/Q scan though unlikely chronic PE with long-term anticoagulation.  3. AKI: Baseline SCr 1.3-1.5. 1.8 on admit. Creatinine 2.33 => 2.26 with diuresis yesterday. Suspect cardiorenal 2/2 severe RV failure.  - May need trial of inotrope for RV support with diuresis, but can hold off for now with reasonable diuresis overnight and lower creatinine.  4. Chronic Atrial Fibrillation: Can stop diltiazem CD and metoprolol tartrate and use Toprol XL 50 mg daily.  Continue warfarin 5. OSA/OHS: Has not had CPAP machine but is using home oxygen.  - Restart CPAP.  6. Diabetes - Metformin on hold given AKI - cover w/ SSI  7. MGUS: Probably not cardiac amyloidosis based on echo. - Reasonable to check multiple myeloma panel   Length of Stay: 12  Loralie Champagne, MD    04/10/2019, 8:46 AM  Advanced Heart Failure Team Pager [phone] (M-F; [place])  Please contact [facility] for night-coverage after hours (4p -7a ) and weekends on [email or web address]

## 2019-04-10 NOTE — Progress Notes (Signed)
CARDIAC REHAB PHASE I   PRE:  Rate/Rhythm: 70 afib    BP: sitting 123/73    SaO2: 97 2L  MODE:  Ambulation: 120 ft   POST:  Rate/Rhythm: 79 afib    BP: sitting 122/67     SaO2: 90-92 2L  Pt able to walk with RW and 2L. Slow pace, no major c/o. "I did pretty good". Left in bed for nap. [id number]   [place], ACSM 04/10/2019 3:32 PM

## 2019-04-10 NOTE — TOC Progression Note (Signed)
Transition of Care [facility]) - Progression Note    Patient Details  Name: William Flores MRN: [id number] Date of Birth: 1950/01/18  Transition of Care [facility]) CM/SW Contact  Zenon Mayo, RN Phone Number: 04/10/2019, 1:26 PM  Clinical Narrative:    NCM spoke with patient daughter regarding HHPT, she states they do not want HHPT, due to covid and she has small chilren.  She asked about outpatient pt,then states she can do it with patient at home herself if NCM will talk with him to let him see he will need to work with her at home.    Expected Discharge Plan: [place] Barriers to Discharge: Continued Medical Work up  Expected Discharge Plan and Services Expected Discharge Plan: [place]   Discharge Planning Services: CM Consult Post Acute Care Choice: [place] arrangements for the past 2 months: Single Family Home Expected Discharge Date: 03/31/19                                     Social Determinants of Health (SDOH) Interventions    Readmission Risk Interventions No flowsheet data found.

## 2019-04-10 NOTE — Progress Notes (Signed)
PROGRESS NOTE    William Flores  UUV:[id number] DOB: Sep 25, 1950 DOA: 03/28/2019 PCP: [name], [name], MD   Brief Narrative:  69 year old with ischemic cardiomyopathy, DM2, HTN, CKD, chronic A. fib on anticoagulation, colon cancer, MGUS presented with worsening shortness of breath diagnosed with CHF exacerbation.  Initially at [facility] started on diuretics, seen by cardiology eventually recommended transferring the patient to [facility] for evaluation by CHF team.  CHF team is planning right heart catheterization   Assessment & Plan:   Principal Problem:   Acute on chronic heart failure with preserved ejection fraction (HFpEF) ([place]) Active Problems:   Hypertensive heart disease   Morbid obesity ([place])   OSA (obstructive sleep apnea)   Nonischemic cardiomyopathy ([place])   Anticoagulated on Coumadin   Atrial fibrillation with slow ventricular response ([place])   AKI (acute kidney injury) ([place])   DM (diabetes mellitus), type 2, uncontrolled, with renal complications ([place])   CKD stage 3 due to type 2 diabetes mellitus ([place])   Pulmonary hypertension ([place])   Acute systolic congestive heart failure ([place])   Acute exacerbation of CHF (congestive heart failure) (HCC)   Bilateral leg edema   Chronic renal failure  Acute congestive heart failure with preserved ejection fraction, 55-60% Cor pulmonale with severe pulmonary hypertension -Lasix 120 milligrams twice daily -Plans for right heart cath. -Need for inotropic agent? -Daily weight, monitor input and output.  If necessary will place condom catheter -Echocardiogram showed EF of 55 to 60%, right ventricular enlargement and right ventricular systolic failure with severe pulmonary hypertension. -Continue Lipitor, metoprolol  Permanent atrial fibrillation -Rate controlled.  On Coumadin pharmacy to manage -On metoprolol and Cardizem  Acute kidney injury on CKD stage IIIa  Cardiorenal syndrome -Baseline creatinine 1.5.  Continue  to monitor renal function.  Creatinine 2.2  Diabetes mellitus type 2, insulin-dependent.  Poorly controlled. -Lantus 18 units at bedtime.  Insulin sliding scale and Accu-Chek.  Obstructive sleep apnea  Anemia -Suspect secondary to underlying renal disease and chronic.  Low iron saturations.  Ferritin 52. -Already on iron supplements, bowel regimen  GERD -PPI  DVT prophylaxis: Coumadin Code Status: Full Family Communication: None at bedside Disposition Plan:   Patient From= home  Patient Anticipated D/C place= to be determined  Barriers= still quite dyspneic requiring aggressive IV diuretics eventually might require RHC.  Subjective: Still continues to have dyspneic episodes with exertion.  Review of Systems Otherwise negative except as per HPI, including: General: Denies fever, chills, night sweats or unintended weight loss. Resp: Denies cough, wheezing Cardiac: Denies chest pain, palpitations, orthopnea, paroxysmal nocturnal dyspnea. GI: Denies abdominal pain, nausea, vomiting, diarrhea or constipation GU: Denies dysuria, frequency, hesitancy or incontinence MS: Denies muscle aches, joint pain or swelling Neuro: Denies headache, neurologic deficits (focal weakness, numbness, tingling), abnormal gait Psych: Denies anxiety, depression, SI/HI/AVH Skin: Denies new rashes or lesions ID: Denies sick contacts, exotic exposures, travel  Examination: Constitutional: Not in acute distress Respiratory: Bibasilar crackles Cardiovascular: Normal sinus rhythm, no rubs Abdomen: Nontender nondistended good bowel sounds Musculoskeletal: 2+ lower extremity pitting edema Skin: No rashes seen Neurologic: CN 2-12 grossly intact.  And nonfocal Psychiatric: Normal judgment and insight. Alert and oriented x 3. Normal mood.   Objective: Vitals:   04/09/19 1958 04/10/19 0115 04/10/19 0322 04/10/19 0842  BP: 123/67  99/67 125/66  Pulse: 64  67 80  Resp: 17  20   Temp: 98 F (36.7 C)   (!) 97.5 F (36.4 C)   TempSrc: Oral  Oral  SpO2: 95%  90%   Weight:  127 kg    Height:        Intake/Output Summary (Last 24 hours) at 04/10/2019 1200 Last data filed at 04/10/2019 0945 Gross per 24 hour  Intake 1501 ml  Output 3150 ml  Net -1649 ml   Filed Weights   04/08/19 0500 04/09/19 0353 04/10/19 0115  Weight: 127.5 kg 127.2 kg 127 kg     Data Reviewed:   CBC: Recent Labs  Lab 04/09/19 0755 04/10/19 0411  WBC 4.9 4.2  HGB 9.4* 9.4*  HCT 33.9* 34.1*  MCV 88.3 88.1  PLT 163 503*   Basic Metabolic Panel: Recent Labs  Lab 04/04/19 0448 04/04/19 0448 04/05/19 0452 04/05/19 0452 04/06/19 0552 04/07/19 0538 04/08/19 0418 04/09/19 0755 04/10/19 0411  NA 138   < > 138   < > 138 138 140 141 141  K 4.3   < > 4.1   < > 4.0 4.0 4.1 4.1 4.2  CL 97*   < > 98   < > 96* 97* 97* 96* 100  CO2 29   < > 30   < > 32 30 31 32 32  GLUCOSE 113*   < > 156*   < > 134* 104* 122* 112* 113*  BUN 75*   < > 77*   < > 81* 84* 89* 87* 88*  CREATININE 2.26*   < > 2.28*   < > 2.25* 2.25* 2.47* 2.33* 2.26*  CALCIUM 9.1   < > 8.8*   < > 9.0 8.7* 8.9 8.6* 8.6*  MG 2.6*  --  2.4  --   --  2.5* 2.4  --  2.4   < > = values in this interval not displayed.   GFR: Estimated Creatinine Clearance: 38.7 mL/min (A) (by C-G formula based on SCr of 2.26 mg/dL (H)). Liver Function Tests: No results for input(s): AST, ALT, ALKPHOS, BILITOT, PROT, ALBUMIN in the last 168 hours. No results for input(s): LIPASE, AMYLASE in the last 168 hours. No results for input(s): AMMONIA in the last 168 hours. Coagulation Profile: Recent Labs  Lab 04/06/19 0552 04/07/19 0538 04/08/19 0418 04/09/19 0755 04/10/19 0411  INR 2.1* 2.3* 2.4* 2.4* 2.6*   Cardiac Enzymes: No results for input(s): CKTOTAL, CKMB, CKMBINDEX, TROPONINI in the last 168 hours. BNP (last 3 results) No results for input(s): PROBNP in the last 8760 hours. HbA1C: No results for input(s): HGBA1C in the last 72 hours. CBG: Recent Labs    Lab 04/09/19 1137 04/09/19 1635 04/09/19 2107 04/10/19 0603 04/10/19 1111  GLUCAP 109* 137* 179* 78 121*   Lipid Profile: No results for input(s): CHOL, HDL, LDLCALC, TRIG, CHOLHDL, LDLDIRECT in the last 72 hours. Thyroid Function Tests: Recent Labs    04/09/19 0920 04/10/19 0411  TSH 6.698*  --   FREET4  --  0.74   Anemia Panel: Recent Labs    04/09/19 0920 04/10/19 0411  FERRITIN  --  52  TIBC 469*  --   IRON 38*  --    Sepsis Labs: No results for input(s): PROCALCITON, LATICACIDVEN in the last 168 hours.  No results found for this or any previous visit (from the past 240 hour(s)).       Radiology Studies: DG CHEST PORT 1 VIEW  Result Date: 04/10/2019 CLINICAL DATA:  CHF EXAM: PORTABLE CHEST 1 VIEW COMPARISON:  Portable exam 1134 hours compared to 03/28/2019 FINDINGS: Enlargement of cardiac silhouette with vascular congestion. Mediastinal contour stable. Atherosclerotic calcifications  aorta. Interstitial infiltrates likely representing pulmonary edema and CHF. Cannot exclude small LEFT pleural effusion. No pneumothorax. Bones demineralized. IMPRESSION: Enlargement of cardiac silhouette with pulmonary vascular congestion and diffuse pulmonary edema consistent with CHF. Question small LEFT pleural effusion. Electronically Signed   By: Lavonia Dana M.D.   On: 04/10/2019 11:44        Scheduled Meds: . allopurinol  300 mg Oral Daily  . atorvastatin  20 mg Oral QPM  . benzonatate  100 mg Oral BID  . ferrous sulfate  325 mg Oral Q breakfast  . insulin aspart  0-15 Units Subcutaneous TID WC  . insulin aspart  0-5 Units Subcutaneous QHS  . insulin glargine  18 Units Subcutaneous QHS  . metoprolol succinate  50 mg Oral Daily  . pantoprazole  40 mg Oral Daily  . polyethylene glycol  17 g Oral Daily  . potassium chloride SA  20 mEq Oral Daily  . sodium chloride flush  3 mL Intravenous Q12H  . warfarin  5 mg Oral ONCE-1600  . Warfarin - Pharmacist Dosing Inpatient    Does not apply q1600   Continuous Infusions: . sodium chloride    . ferumoxytol Stopped (04/09/19 1855)  . furosemide 120 mg (04/10/19 0837)     LOS: 12 days   Time spent= 35 mins    Shantana Christon Arsenio Loader, MD [facility]  If 7PM-7AM, please contact night-coverage  04/10/2019, 12:00 PM

## 2019-04-10 NOTE — Progress Notes (Signed)
Patient with 6 beats of VT.  Asymptomatic.  APP notified.

## 2019-04-10 NOTE — Progress Notes (Signed)
[place] for warfarin Indication: atrial fibrillation  Allergies  Allergen Reactions  . Codeine     Headache     Patient Measurements: Height: _0  (162.6 cm) Weight: 127 kg (280 lb) IBW/kg (Calculated) : 59.2  Vital Signs: Temp: 97.5 F (36.4 C) (04/07 0322) Temp Source: Oral (04/07 0322) BP: 99/67 (04/07 0322) Pulse Rate: 67 (04/07 0322)  Labs: Recent Labs    04/08/19 0418 04/09/19 0755 04/10/19 0411  HGB  --  9.4* 9.4*  HCT  --  33.9* 34.1*  PLT  --  163 143*  LABPROT 26.1* 26.0* 28.1*  INR 2.4* 2.4* 2.6*  CREATININE 2.47* 2.33* 2.26*    Estimated Creatinine Clearance: 38.7 mL/min (A) (by C-G formula based on SCr of 2.26 mg/dL (H)).   Medical History: Past Medical History:  Diagnosis Date  . CAD (coronary artery disease)    LHC 2/08:  mRCA 50%, o/w no sig CAD, EF 25%  . Cardiomyopathy ([place])    EF 35-40% in 2/16 in setting of AF with RVR >> echo 5/16 with improved LVF with EF 65-70%  . Chronic atrial fibrillation ([place])   . Chronic diastolic heart failure (HCC)    HFPF - EF ~65-70% (OFTEN EXACERBATED BY AFIB)  . CKD (chronic kidney disease)    hx of worsening renal failure and hyperK+ in setting of acute diast CHF >> required CVVHD in 4/16 and dialysis x 1 in 5/16  . CKD stage 3 due to type 2 diabetes mellitus ([place]) 09/17/2015  . Colon cancer ([place])   . Colon polyps 09/21/2012   Tubular adenoma  . Diabetes ([place])   . History of echocardiogram    Echo 05/16/14:  mild LVH, vigorous LVF, EF 65-70%, no RWMA, mean AV gradient 9 mmHg, MAC, mild MR, mod LAE, mild RVE, mild reduced RVF, severe RAE  . Hyperlipidemia   . Hypertension   . Obesity hypoventilation syndrome ([place])   . Renal insufficiency   . Sleep apnea     Medications:  Medications Prior to Admission  Medication Sig Dispense Refill Last Dose  . acetaminophen (TYLENOL) 325 MG tablet Take 2 tablets (650 mg total) by mouth every 6 (six) hours as needed for mild  pain (or Fever >/= 101). 12 tablet 0 03/26/2019  . allopurinol (ZYLOPRIM) 300 MG tablet TAKE 1 TABLET BY MOUTH EVERY DAY 90 tablet 0 03/28/2019  . atorvastatin (LIPITOR) 20 MG tablet 1 tablet daily 90 tablet 3 03/28/2019  . azelastine (OPTIVAR) 0.05 % ophthalmic solution PLACE 2 DROPS INTO BOTH EYES 2 (TWO) TIMES DAILY. (Patient taking differently: Place 2 drops into the right eye 2 (two) times daily as needed (dry eye from allergies). ) 6 mL 5 03/28/2019  . benzonatate (TESSALON PERLES) 100 MG capsule Take 1 capsule (100 mg total) by mouth 2 (two) times daily. (Patient taking differently: Take 100 mg by mouth 2 (two) times daily as needed. ) 60 capsule 3 03/28/2019  . DILT-XR 180 MG 24 hr capsule TAKE 1 CAPSULE BY MOUTH EVERY DAY (Patient taking differently: Take 180 mg by mouth in the morning and at bedtime. ) 90 capsule 0 03/28/2019  . FARXIGA 5 MG TABS tablet Take 5 mg by mouth daily.   03/28/2019  . ferrous sulfate 325 (65 FE) MG tablet Take 325 mg by mouth daily with breakfast.    03/28/2019  . furosemide (LASIX) 80 MG tablet Take 1 tablet (80 mg total) by mouth daily. 30 tablet 2 03/28/2019  .  insulin aspart (NOVOLOG FLEXPEN) 100 UNIT/ML FlexPen insulin aspart (novoLOG) injection 0-9 Units 0-9 Units, Subcutaneous, 3 times daily with meals, CBG < 70: Implement Hypoglycemia Standing Orders and refer to Hypoglycemia Standing Orders sidebar report CBG 70 - 120: 0 units CBG 121 - 150: 1 unit CBG 151 - 200: 2 units CBG 201 - 250: 3 units CBG 251 - 300: 5 units CBG 301 - 350: 7 units CBG 351 - 400: 9 units CBG > 400 (Patient taking differently: Inject 9 Units into the skin in the morning and at bedtime. insulin aspart (novoLOG) injection 0-9 Units 0-9 Units, Subcutaneous, 3 times daily with meals, CBG < 70: Implement Hypoglycemia Standing Orders and refer to Hypoglycemia Standing Orders sidebar report CBG 70 - 120: 0 units CBG 121 - 150: 1 unit CBG 151 - 200: 2 units CBG 201 - 250: 3 units CBG 251 - 300: 5 units CBG  301 - 350: 7 units CBG 351 - 400: 9 units CBG > 400) 15 mL 11 03/28/2019  . insulin degludec (TRESIBA FLEXTOUCH) 100 UNIT/ML SOPN FlexTouch Pen Inject 0.18 mLs (18 Units total) into the skin at bedtime. 15 pen 1 03/28/2019  . losartan (COZAAR) 100 MG tablet Take 1 tablet (100 mg total) by mouth daily. 90 tablet 0 03/28/2019  . metFORMIN (GLUCOPHAGE) 500 MG tablet Take 1 tablet (500 mg total) by mouth 2 (two) times daily with a meal. 180 tablet 1 03/28/2019  . metoprolol tartrate (LOPRESSOR) 50 MG tablet Take 1 tablet (50 mg total) by mouth 2 (two) times daily. 180 tablet 3 03/28/2019  . pantoprazole (PROTONIX) 40 MG tablet Take 1 tablet (40 mg total) by mouth daily. (Patient taking differently: Take 40 mg by mouth daily as needed. ) 30 tablet 5 03/27/2019  . polyethylene glycol (MIRALAX / GLYCOLAX) 17 g packet Take 17 g by mouth daily. (Patient taking differently: Take 17 g by mouth daily as needed. ) 14 each 0 03/25/2019  . potassium chloride SA (KLOR-CON M20) 20 MEQ tablet TAKE 1/2 TABLET (10 MEQ TOTAL) BY MOUTH DAILY. NEED OV. *INS WILL COVER 4/19 OR LATER* 45 tablet 1 03/28/2019  . TRULICITY 1.5 HU/7.6LY SOPN INJECT 1.5 MG INTO THE SKIN ONCE A WEEK. 6 mL 0 03/22/2019  . warfarin (COUMADIN) 5 MG tablet Take 1 tablet (5 mg total) by mouth every evening. (Patient taking differently: Take 5 mg by mouth every evening. 12/2018: directions were "take as directed". 01/2019: directions were "take 1 tab po qd". Patient has been taking 1 tablet every other day, alternating 1.5 tabs on the other days.) 30 tablet 3 03/28/2019  . Insulin Pen Needle 32G X 4 MM MISC Use to give insulin daily Dx E11.29 (Patient not taking: Reported on 03/30/2019) 100 each 3 Not Taking at Unknown time  . polyvinyl alcohol (LIQUIFILM TEARS) 1.4 % ophthalmic solution Place 1 drop into both eyes as needed for dry eyes. (Patient not taking: Reported on 03/30/2019) 15 mL 0 Not Taking at Unknown time    Assessment: Pharmacy consulted to dose warfarin  in patient with atrial fibrillation.  Patient's home dose listed as alternating 5 mg and 7.5 mg doses.  INR today is 2.6. CBC stable  Noted request to run INR close to end of goal with anticipated need to do RHC after further diuresis  Goal of Therapy:  INR 2-3 Monitor platelets by anticoagulation protocol: Yes   Plan:  Warfarin 5 mg x 1 today (home dose would have been 7.5 mg today) Monitor daily  INR and s/s of bleeding.  Vertis Kelch, PharmD, [facility] PGY2 Cardiology Pharmacy Resident Phone [phone] 04/10/2019       7:24 AM  Please check [email or web address] for unit-specific pharmacist phone numbers

## 2019-04-10 NOTE — Progress Notes (Signed)
Patient HR in the 50s.  APP notified. Lopressor held until otherwise notified by APP.

## 2019-04-11 ENCOUNTER — Encounter (HOSPITAL_COMMUNITY)
Admission: EM | Disposition: A | Discharge status: Home / Self Care | Payer: Self-pay | Source: Home / Self Care | Attending: Family Medicine

## 2019-04-11 DIAGNOSIS — I272 Pulmonary hypertension, unspecified: Secondary | ICD-10-CM

## 2019-04-11 DIAGNOSIS — I509 Heart failure, unspecified: Secondary | ICD-10-CM

## 2019-04-11 HISTORY — PX: RIGHT HEART CATH: CATH118263

## 2019-04-11 LAB — POCT I-STAT EG7
Acid-Base Excess: 12 mmol/L — ABNORMAL HIGH (ref 0.0–2.0)
Acid-Base Excess: 12 mmol/L — ABNORMAL HIGH (ref 0.0–2.0)
Bicarbonate: 39.4 mmol/L — ABNORMAL HIGH (ref 20.0–28.0)
Bicarbonate: 39.9 mmol/L — ABNORMAL HIGH (ref 20.0–28.0)
Calcium, Ion: 1.24 mmol/L (ref 1.15–1.40)
Calcium, Ion: 1.25 mmol/L (ref 1.15–1.40)
HCT: 34 % — ABNORMAL LOW (ref 39.0–52.0)
HCT: 34 % — ABNORMAL LOW (ref 39.0–52.0)
Hemoglobin: 11.6 g/dL — ABNORMAL LOW (ref 13.0–17.0)
Hemoglobin: 11.6 g/dL — ABNORMAL LOW (ref 13.0–17.0)
O2 Saturation: 59 %
O2 Saturation: 60 %
Potassium: 4.2 mmol/L (ref 3.5–5.1)
Potassium: 4.2 mmol/L (ref 3.5–5.1)
Sodium: 144 mmol/L (ref 135–145)
Sodium: 145 mmol/L (ref 135–145)
TCO2: 42 mmol/L — ABNORMAL HIGH (ref 22–32)
TCO2: 42 mmol/L — ABNORMAL HIGH (ref 22–32)
pCO2, Ven: 68.1 mmHg — ABNORMAL HIGH (ref 44.0–60.0)
pCO2, Ven: 68.8 mmHg — ABNORMAL HIGH (ref 44.0–60.0)
pH, Ven: 7.367 (ref 7.250–7.430)
pH, Ven: 7.376 (ref 7.250–7.430)
pO2, Ven: 33 mmHg (ref 32.0–45.0)
pO2, Ven: 33 mmHg (ref 32.0–45.0)

## 2019-04-11 LAB — PROTIME-INR
INR: 2.8 — ABNORMAL HIGH (ref 0.8–1.2)
Prothrombin Time: 29.5 seconds — ABNORMAL HIGH (ref 11.4–15.2)

## 2019-04-11 LAB — CBC
HCT: 34.8 % — ABNORMAL LOW (ref 39.0–52.0)
Hemoglobin: 9.5 g/dL — ABNORMAL LOW (ref 13.0–17.0)
MCH: 24.8 pg — ABNORMAL LOW (ref 26.0–34.0)
MCHC: 27.3 g/dL — ABNORMAL LOW (ref 30.0–36.0)
MCV: 90.9 fL (ref 80.0–100.0)
Platelets: 132 10*3/uL — ABNORMAL LOW (ref 150–400)
RBC: 3.83 MIL/uL — ABNORMAL LOW (ref 4.22–5.81)
RDW: 20.2 % — ABNORMAL HIGH (ref 11.5–15.5)
WBC: 3.8 10*3/uL — ABNORMAL LOW (ref 4.0–10.5)
nRBC: 0 % (ref 0.0–0.2)

## 2019-04-11 LAB — FERRITIN: Ferritin: 163 ng/mL (ref 24–336)

## 2019-04-11 LAB — BASIC METABOLIC PANEL
Anion gap: 9 (ref 5–15)
BUN: 74 mg/dL — ABNORMAL HIGH (ref 8–23)
CO2: 36 mmol/L — ABNORMAL HIGH (ref 22–32)
Calcium: 8.9 mg/dL (ref 8.9–10.3)
Chloride: 99 mmol/L (ref 98–111)
Creatinine, Ser: 2.03 mg/dL — ABNORMAL HIGH (ref 0.61–1.24)
GFR calc Af Amer: 38 mL/min — ABNORMAL LOW (ref >60)
GFR calc non Af Amer: 33 mL/min — ABNORMAL LOW (ref >60)
Glucose, Bld: 70 mg/dL (ref 70–99)
Potassium: 4.1 mmol/L (ref 3.5–5.1)
Sodium: 144 mmol/L (ref 135–145)

## 2019-04-11 LAB — GLUCOSE, CAPILLARY
Glucose-Capillary: 67 mg/dL — ABNORMAL LOW (ref 70–99)
Glucose-Capillary: 73 mg/dL (ref 70–99)
Glucose-Capillary: 134 mg/dL — ABNORMAL HIGH (ref 70–99)
Glucose-Capillary: 146 mg/dL — ABNORMAL HIGH (ref 70–99)

## 2019-04-11 LAB — MAGNESIUM: Magnesium: 2.4 mg/dL (ref 1.7–2.4)

## 2019-04-11 SURGERY — RIGHT HEART CATH
Anesthesia: LOCAL

## 2019-04-11 MED ORDER — WARFARIN SODIUM 5 MG PO TABS
5.0000 mg | ORAL_TABLET | Freq: Once | ORAL | Status: DC
  Filled 2019-04-11: qty 1

## 2019-04-11 MED ORDER — ASPIRIN 81 MG PO CHEW
81.0000 mg | CHEWABLE_TABLET | ORAL | Status: AC
  Administered 2019-04-11: 81 mg via ORAL
  Filled 2019-04-11: qty 1

## 2019-04-11 MED ORDER — SODIUM CHLORIDE 0.9 % IV SOLN: 250.0000 mL | INTRAVENOUS | Status: DC | PRN

## 2019-04-11 MED ORDER — DEXTROSE 50 % IV SOLN
12.5000 g | INTRAVENOUS | Status: AC
  Administered 2019-04-11: 12.5 g via INTRAVENOUS
  Filled 2019-04-11: qty 50

## 2019-04-11 MED ORDER — SODIUM CHLORIDE 0.9% FLUSH: 3.0000 mL | INTRAVENOUS | Status: DC | PRN

## 2019-04-11 MED ORDER — HEPARIN (PORCINE) IN NACL 1000-0.9 UT/500ML-% IV SOLN
INTRAVENOUS | Status: DC | PRN
  Administered 2019-04-11: 500 mL

## 2019-04-11 MED ORDER — LIDOCAINE HCL (PF) 1 % IJ SOLN
INTRAMUSCULAR | Status: DC | PRN
  Administered 2019-04-11: 2 mL

## 2019-04-11 MED ORDER — LIDOCAINE HCL (PF) 1 % IJ SOLN
INTRAMUSCULAR | Status: AC
  Filled 2019-04-11: qty 30

## 2019-04-11 MED ORDER — HEPARIN (PORCINE) IN NACL 1000-0.9 UT/500ML-% IV SOLN
INTRAVENOUS | Status: AC
  Filled 2019-04-11: qty 500

## 2019-04-11 MED ORDER — SODIUM CHLORIDE 0.9 % IV SOLN: INTRAVENOUS | Status: DC

## 2019-04-11 MED ORDER — DEXTROSE 50 % IV SOLN
INTRAVENOUS | Status: AC
  Filled 2019-04-11: qty 50

## 2019-04-11 MED ORDER — WARFARIN SODIUM 5 MG PO TABS
5.0000 mg | ORAL_TABLET | Freq: Once | ORAL | Status: AC
  Administered 2019-04-11: 5 mg via ORAL

## 2019-04-11 SURGICAL SUPPLY — 7 items
CATH BALLN WEDGE 5F 110CM (CATHETERS) ×1 IMPLANT
PACK CARDIAC CATHETERIZATION (CUSTOM PROCEDURE TRAY) ×2 IMPLANT
SHEATH GLIDE SLENDER 4/5FR (SHEATH) ×1 IMPLANT
TRANSDUCER W/STOPCOCK (MISCELLANEOUS) ×2 IMPLANT
TUBING ART PRESS 72  MALE/FEM (TUBING) ×2
TUBING ART PRESS 72 MALE/FEM (TUBING) IMPLANT
WIRE EMERALD 3MM-J .025X260CM (WIRE) ×1 IMPLANT

## 2019-04-11 NOTE — Progress Notes (Signed)
PROGRESS NOTE    William Flores  TOI:[id number] DOB: 09/26/1950 DOA: 03/28/2019 PCP: [name], [name], MD    Subjective:  The patient was seen and examined this morning, on 3 L of oxygen, reporting shortness breath with any exertion.  But reported that he has been diuresing well. He is scheduled for right heart catheterization this morning. Patient remains to be on IV Lasix, creatinine improving   --------------------------------------------------------------------------------------------------------------------------------  Brief Narrative:  69 year old with ischemic cardiomyopathy, DM2, HTN, CKD, chronic A. fib on anticoagulation, colon cancer, MGUS presented with worsening shortness of breath diagnosed with CHF exacerbation.  Initially at [facility] started on diuretics, seen by cardiology eventually recommended transferring the patient to [facility] for evaluation by CHF team.   Patient remains on IV Lasix 125 mg twice daily, diuresing well, monitoring I's and O and daily weight, CHF team is planning right heart catheterization on 04/11/2019   ------------------------------------------------------------------------------------------------------------------------------- Assessment & Plan:   Principal Problem:   Acute on chronic heart failure with preserved ejection fraction (HFpEF) ([place]) Active Problems:   Hypertensive heart disease   Morbid obesity ([place])   OSA (obstructive sleep apnea)   Nonischemic cardiomyopathy ([place])   Anticoagulated on Coumadin   Atrial fibrillation with slow ventricular response ([place])   AKI (acute kidney injury) ([place])   DM (diabetes mellitus), type 2, uncontrolled, with renal complications ([place])   CKD stage 3 due to type 2 diabetes mellitus ([place])   Pulmonary hypertension ([place])   Acute systolic congestive heart failure ([place])   Acute exacerbation of CHF (congestive heart failure) (HCC)   Bilateral leg edema   Chronic renal failure  Acute  congestive heart failure with preserved ejection fraction, 55-60% Cor pulmonale with severe pulmonary hypertension -Lasix 120 milligrams twice daily  -Plans for right heart cath... Today later this afternoon. -Need for inotropic agent? -Daily weight, monitor input and output.   Reported approximately 4.6 L urine output. -Echocardiogram showed EF of 55 to 60%, right ventricular enlargement and right ventricular systolic failure with severe pulmonary hypertension. -Continue Lipitor, metoprolol  Permanent atrial fibrillation -Heart rate well controlled, with Cardizem and metoprolol -Remain on Coumadin, managed per pharmacy with INR goal of 2-3, INR 2.8 today -On metoprolol and Cardizem  Acute kidney injury on CKD stage IIIa  Cardiorenal syndrome -Baseline creatinine 1.5.  Continue to monitor renal function.   Creatinine 2.33 >> 2.26 >> 2.03 today   Diabetes mellitus type 2, insulin-dependent.  Poorly controlled. -Lantus 18 units at bedtime.  Insulin sliding scale and Accu-Chek.  Obstructive sleep apnea -Likely will need outpatient evaluation of sleep study Stable on 2-3 L of oxygen, satting 92%  Anemia -Suspect secondary to underlying renal disease and chronic.  Low iron saturations.  Ferritin 52. -Already on iron supplements, bowel regimen  GERD -PPI  DVT prophylaxis: Coumadin Code Status: Full Family Communication: None at bedside Disposition Plan:   Patient From= home  Patient Anticipated D/C place= to be determined  Barriers= still quite dyspneic requiring aggressive IV diuretics eventually might require RHC.    Objective: Vitals:   04/11/19 1200 04/11/19 1204 04/11/19 1209 04/11/19 1225  BP: (!) 146/98 136/74 (!) 143/79 120/63  Pulse: 87 93 78 75  Resp: (!) 8 18 17 18   Temp:      TempSrc:      SpO2: 99% 96% 98% 92%  Weight:      Height:        Intake/Output Summary (Last 24 hours) at 04/11/2019 1246 Last data filed at 04/11/2019  1123 Gross per 24 hour   Intake 664 ml  Output 5275 ml  Net -4611 ml   Filed Weights   04/09/19 0353 04/10/19 0115 04/11/19 0149  Weight: 127.2 kg 127 kg 124 kg    Physical Exam  Constitution:  Alert, cooperative, no distress,  Psychiatric: Normal and stable mood and affect, cognition intact,   HEENT: Normocephalic, PERRL, otherwise with in Normal limits  Chest:Chest symmetric Cardio vascular:  S1/S2, RRR, No murmure, No Rubs or Gallops  pulmonary: Clear to auscultation bilaterally, respirations unlabored, negative wheezes / crackles Abdomen: Soft, non-tender, non-distended, bowel sounds,no masses, no organomegaly Muscular skeletal: Limited exam - in bed, able to move all 4 extremities, Normal strength,  Neuro: CNII-XII intact. , normal motor and sensation, reflexes intact  Extremities: +1 pitting edema lower extremities, +2 pulses  Skin: Dry, warm to touch, negative for any Rashes, No open wounds Wounds: per nursing documentation Pressure Ulcer 02/17/14 Stage II -  Partial thickness loss of dermis presenting as a shallow open ulcer with a red, pink wound bed without slough. (Active)  02/17/14 2030  Location: Sacrum  Location Orientation: Bilateral  Staging: Stage II -  Partial thickness loss of dermis presenting as a shallow open ulcer with a red, pink wound bed without slough.  Wound Description (Comments):   Present on Admission: Yes (present on admission to [facility])      Data Reviewed:   CBC: Recent Labs  Lab 04/09/19 0755 04/10/19 0411 04/11/19 0423  WBC 4.9 4.2 3.8*  HGB 9.4* 9.4* 9.5*  HCT 33.9* 34.1* 34.8*  MCV 88.3 88.1 90.9  PLT 163 143* 741*   Basic Metabolic Panel: Recent Labs  Lab 04/05/19 0452 04/06/19 0552 04/07/19 0538 04/08/19 0418 04/09/19 0755 04/10/19 0411 04/11/19 0423  NA 138   < > 138 140 141 141 144  K 4.1   < > 4.0 4.1 4.1 4.2 4.1  CL 98   < > 97* 97* 96* 100 99  CO2 30   < > 30 31 32 32 36*  GLUCOSE 156*   < > 104* 122* 112* 113* 70  BUN 77*   < > 84*  89* 87* 88* 74*  CREATININE 2.28*   < > 2.25* 2.47* 2.33* 2.26* 2.03*  CALCIUM 8.8*   < > 8.7* 8.9 8.6* 8.6* 8.9  MG 2.4  --  2.5* 2.4  --  2.4 2.4   < > = values in this interval not displayed.   GFR: Estimated Creatinine Clearance: 42.5 mL/min (A) (by C-G formula based on SCr of 2.03 mg/dL (H)). Liver Function Tests: No results for input(s): AST, ALT, ALKPHOS, BILITOT, PROT, ALBUMIN in the last 168 hours. No results for input(s): LIPASE, AMYLASE in the last 168 hours. No results for input(s): AMMONIA in the last 168 hours. Coagulation Profile: Recent Labs  Lab 04/07/19 0538 04/08/19 0418 04/09/19 0755 04/10/19 0411 04/11/19 0423  INR 2.3* 2.4* 2.4* 2.6* 2.8*  CBG: Recent Labs  Lab 04/10/19 1111 04/10/19 1652 04/10/19 2118 04/11/19 0618 04/11/19 0652  GLUCAP 121* 99 198* 67* 73    Thyroid Function Tests: Recent Labs    04/09/19 0920 04/10/19 0411  TSH 6.698*  --   FREET4  --  0.74   Anemia Panel: Recent Labs    04/09/19 0920 04/10/19 0411 04/11/19 0423  FERRITIN  --  52 163  TIBC 469*  --   --   IRON 38*  --   --     Radiology Studies: NM  Pulmonary Perfusion  Result Date: 04/10/2019 CLINICAL DATA:  Chronic atrial fibrillation, ischemic cardiomyopathy worsening shortness of breath, evaluate for chronic PE EXAM: NUCLEAR MEDICINE PERFUSION LUNG SCAN TECHNIQUE: Perfusion images were obtained in multiple projections after intravenous injection of radiopharmaceutical. Ventilation scans intentionally deferred if perfusion scan and chest x-ray adequate for interpretation during COVID 19 epidemic. RADIOPHARMACEUTICALS:  1.8 mCi Tc-88mMAA IV COMPARISON:  Correlation with chest radiographs dated 04/10/2019 FINDINGS: Normal lung perfusion. No wedge-shaped defects suspicious for pulmonary embolism. Corresponding chest radiographs demonstrate cardiomegaly with mild interstitial edema. Possible small left pleural effusion. IMPRESSION: No evidence of pulmonary embolism.  Electronically Signed   By: SJulian HyM.D.   On: 04/10/2019 14:03   CARDIAC CATHETERIZATION  Result Date: 04/11/2019 1. Elevated right and left heart filling pressures. 2. Severe mixed pulmonary venous/pulmonary arterial hypertension. 3. Preserved cardiac output.   DG CHEST PORT 1 VIEW  Result Date: 04/10/2019 CLINICAL DATA:  CHF EXAM: PORTABLE CHEST 1 VIEW COMPARISON:  Portable exam 1134 hours compared to 03/28/2019 FINDINGS: Enlargement of cardiac silhouette with vascular congestion. Mediastinal contour stable. Atherosclerotic calcifications aorta. Interstitial infiltrates likely representing pulmonary edema and CHF. Cannot exclude small LEFT pleural effusion. No pneumothorax. Bones demineralized. IMPRESSION: Enlargement of cardiac silhouette with pulmonary vascular congestion and diffuse pulmonary edema consistent with CHF. Question small LEFT pleural effusion. Electronically Signed   By: MLavonia DanaM.D.   On: 04/10/2019 11:44        Scheduled Meds: . allopurinol  300 mg Oral Daily  . atorvastatin  20 mg Oral QPM  . benzonatate  100 mg Oral BID  . ferrous sulfate  325 mg Oral Q breakfast  . insulin aspart  0-15 Units Subcutaneous TID WC  . insulin aspart  0-5 Units Subcutaneous QHS  . insulin glargine  18 Units Subcutaneous QHS  . metoprolol succinate  50 mg Oral Daily  . pantoprazole  40 mg Oral Daily  . polyethylene glycol  17 g Oral Daily  . potassium chloride SA  20 mEq Oral Daily  . sodium chloride flush  3 mL Intravenous Q12H  . sodium chloride flush  3 mL Intravenous Q12H  . Warfarin - Pharmacist Dosing Inpatient   Does not apply q1600   Continuous Infusions: . sodium chloride    . ferumoxytol Stopped (04/09/19 1855)  . furosemide 120 mg (04/11/19 0824)     LOS: 13 days   Time spent= 35 mins    SDeatra James MD [facility]  If 7PM-7AM, please contact night-coverage  04/11/2019, 12:46 PM

## 2019-04-11 NOTE — Progress Notes (Signed)
Orthopedic Tech Progress Note Patient Details:  William Flores December 23, 1950 [id number]  Ortho Devices Type of Ortho Device: Haematologist Ortho Device/Splint Location: B LE Ortho Device/Splint Interventions: Application   Post Interventions Patient Tolerated: Well Instructions Provided: Care of device   William Flores 04/11/2019, 5:30 AM

## 2019-04-11 NOTE — Progress Notes (Signed)
[place] for warfarin Indication: atrial fibrillation  Allergies  Allergen Reactions  . Codeine     Headache     Patient Measurements: Height: _0  (162.6 cm) Weight: 124 kg (273 lb 6.4 oz) IBW/kg (Calculated) : 59.2  Vital Signs: Temp: 97.6 F (36.4 C) (04/08 0800) Temp Source: Oral (04/08 0800) BP: 125/58 (04/08 1402) Pulse Rate: 80 (04/08 1402)  Labs: Recent Labs    04/09/19 0755 04/09/19 0755 04/10/19 0411 04/11/19 0423  HGB 9.4*   < > 9.4* 9.5*  HCT 33.9*  --  34.1* 34.8*  PLT 163  --  143* 132*  LABPROT 26.0*  --  28.1* 29.5*  INR 2.4*  --  2.6* 2.8*  CREATININE 2.33*  --  2.26* 2.03*   < > = values in this interval not displayed.    Estimated Creatinine Clearance: 42.5 mL/min (A) (by C-G formula based on SCr of 2.03 mg/dL (H)).   Medical History: Past Medical History:  Diagnosis Date  . CAD (coronary artery disease)    LHC 2/08:  mRCA 50%, o/w no sig CAD, EF 25%  . Cardiomyopathy ([place])    EF 35-40% in 2/16 in setting of AF with RVR >> echo 5/16 with improved LVF with EF 65-70%  . Chronic atrial fibrillation ([place])   . Chronic diastolic heart failure (HCC)    HFPF - EF ~65-70% (OFTEN EXACERBATED BY AFIB)  . CKD (chronic kidney disease)    hx of worsening renal failure and hyperK+ in setting of acute diast CHF >> required CVVHD in 4/16 and dialysis x 1 in 5/16  . CKD stage 3 due to type 2 diabetes mellitus ([place]) 09/17/2015  . Colon cancer ([place])   . Colon polyps 09/21/2012   Tubular adenoma  . Diabetes ([place])   . History of echocardiogram    Echo 05/16/14:  mild LVH, vigorous LVF, EF 65-70%, no RWMA, mean AV gradient 9 mmHg, MAC, mild MR, mod LAE, mild RVE, mild reduced RVF, severe RAE  . Hyperlipidemia   . Hypertension   . Obesity hypoventilation syndrome ([place])   . Renal insufficiency   . Sleep apnea     Medications:  Medications Prior to Admission  Medication Sig Dispense Refill Last Dose  . acetaminophen  (TYLENOL) 325 MG tablet Take 2 tablets (650 mg total) by mouth every 6 (six) hours as needed for mild pain (or Fever >/= 101). 12 tablet 0 03/26/2019  . allopurinol (ZYLOPRIM) 300 MG tablet TAKE 1 TABLET BY MOUTH EVERY DAY 90 tablet 0 03/28/2019  . atorvastatin (LIPITOR) 20 MG tablet 1 tablet daily 90 tablet 3 03/28/2019  . azelastine (OPTIVAR) 0.05 % ophthalmic solution PLACE 2 DROPS INTO BOTH EYES 2 (TWO) TIMES DAILY. (Patient taking differently: Place 2 drops into the right eye 2 (two) times daily as needed (dry eye from allergies). ) 6 mL 5 03/28/2019  . benzonatate (TESSALON PERLES) 100 MG capsule Take 1 capsule (100 mg total) by mouth 2 (two) times daily. (Patient taking differently: Take 100 mg by mouth 2 (two) times daily as needed. ) 60 capsule 3 03/28/2019  . DILT-XR 180 MG 24 hr capsule TAKE 1 CAPSULE BY MOUTH EVERY DAY (Patient taking differently: Take 180 mg by mouth in the morning and at bedtime. ) 90 capsule 0 03/28/2019  . FARXIGA 5 MG TABS tablet Take 5 mg by mouth daily.   03/28/2019  . ferrous sulfate 325 (65 FE) MG tablet Take 325 mg by mouth daily  with breakfast.    03/28/2019  . furosemide (LASIX) 80 MG tablet Take 1 tablet (80 mg total) by mouth daily. 30 tablet 2 03/28/2019  . insulin aspart (NOVOLOG FLEXPEN) 100 UNIT/ML FlexPen insulin aspart (novoLOG) injection 0-9 Units 0-9 Units, Subcutaneous, 3 times daily with meals, CBG < 70: Implement Hypoglycemia Standing Orders and refer to Hypoglycemia Standing Orders sidebar report CBG 70 - 120: 0 units CBG 121 - 150: 1 unit CBG 151 - 200: 2 units CBG 201 - 250: 3 units CBG 251 - 300: 5 units CBG 301 - 350: 7 units CBG 351 - 400: 9 units CBG > 400 (Patient taking differently: Inject 9 Units into the skin in the morning and at bedtime. insulin aspart (novoLOG) injection 0-9 Units 0-9 Units, Subcutaneous, 3 times daily with meals, CBG < 70: Implement Hypoglycemia Standing Orders and refer to Hypoglycemia Standing Orders sidebar report CBG 70 - 120:  0 units CBG 121 - 150: 1 unit CBG 151 - 200: 2 units CBG 201 - 250: 3 units CBG 251 - 300: 5 units CBG 301 - 350: 7 units CBG 351 - 400: 9 units CBG > 400) 15 mL 11 03/28/2019  . insulin degludec (TRESIBA FLEXTOUCH) 100 UNIT/ML SOPN FlexTouch Pen Inject 0.18 mLs (18 Units total) into the skin at bedtime. 15 pen 1 03/28/2019  . losartan (COZAAR) 100 MG tablet Take 1 tablet (100 mg total) by mouth daily. 90 tablet 0 03/28/2019  . metFORMIN (GLUCOPHAGE) 500 MG tablet Take 1 tablet (500 mg total) by mouth 2 (two) times daily with a meal. 180 tablet 1 03/28/2019  . metoprolol tartrate (LOPRESSOR) 50 MG tablet Take 1 tablet (50 mg total) by mouth 2 (two) times daily. 180 tablet 3 03/28/2019  . pantoprazole (PROTONIX) 40 MG tablet Take 1 tablet (40 mg total) by mouth daily. (Patient taking differently: Take 40 mg by mouth daily as needed. ) 30 tablet 5 03/27/2019  . polyethylene glycol (MIRALAX / GLYCOLAX) 17 g packet Take 17 g by mouth daily. (Patient taking differently: Take 17 g by mouth daily as needed. ) 14 each 0 03/25/2019  . potassium chloride SA (KLOR-CON M20) 20 MEQ tablet TAKE 1/2 TABLET (10 MEQ TOTAL) BY MOUTH DAILY. NEED OV. *INS WILL COVER 4/19 OR LATER* 45 tablet 1 03/28/2019  . TRULICITY 1.5 TT/0.1XB SOPN INJECT 1.5 MG INTO THE SKIN ONCE A WEEK. 6 mL 0 03/22/2019  . warfarin (COUMADIN) 5 MG tablet Take 1 tablet (5 mg total) by mouth every evening. (Patient taking differently: Take 5 mg by mouth every evening. 12/2018: directions were "take as directed". 01/2019: directions were "take 1 tab po qd". Patient has been taking 1 tablet every other day, alternating 1.5 tabs on the other days.) 30 tablet 3 03/28/2019  . Insulin Pen Needle 32G X 4 MM MISC Use to give insulin daily Dx E11.29 (Patient not taking: Reported on 03/30/2019) 100 each 3 Not Taking at Unknown time  . polyvinyl alcohol (LIQUIFILM TEARS) 1.4 % ophthalmic solution Place 1 drop into both eyes as needed for dry eyes. (Patient not taking: Reported  on 03/30/2019) 15 mL 0 Not Taking at Unknown time    Assessment: Pharmacy consulted to dose warfarin in patient with atrial fibrillation.  Patient's home dose listed as alternating 5 mg and 7.5 mg doses.  INR today is 2.8. CBC stable  Noted request to run INR close to end of goal with anticipated need to do RHC after further diuresis  Goal of Therapy:  INR 2-3 Monitor platelets by anticoagulation protocol: Yes   Plan:  Warfarin 5 mg x 1 today (PTA dose) Monitor daily INR and s/s of bleeding.  Vertis Kelch, PharmD, [facility] PGY2 Cardiology Pharmacy Resident Phone [phone] 04/11/2019       3:41 PM  Please check [email or web address] for unit-specific pharmacist phone numbers

## 2019-04-11 NOTE — Progress Notes (Signed)
Ambulated with PT earlier today. Now sleeping and declines walking. Sts he is tired and has been busy today. Brought HF booklet and low sodium diets to pts room. Encouraged him to read them and we can discuss another day as he is sleeping and has [place] later. [place], ACSM 11:38 AM 04/11/2019

## 2019-04-11 NOTE — H&P (View-Only) (Signed)
Patient ID: William Flores, male   DOB: [birth year], 69 y.o.   MRN: [id number]     Advanced Heart Failure Rounding Note  PCP-Cardiologist: Minus Breeding, MD   Subjective:    Patient diuresed well again with IV Lasix.  Creatinine 2.33 => 2.26 => 2.03.  He remains in atrial fibrillation with HR in 60s.   Echo: EF 55-60%, severely enlarged RV with severe systolic dysfunction, PASP 76 mmHg.    Objective:   Weight Range: 124 kg Body mass index is 46.93 kg/m.   Vital Signs:   Temp:  [97.6 F (36.4 C)-98.4 F (36.9 C)] 97.6 F (36.4 C) (04/08 0759) Pulse Rate:  [54-71] 66 (04/08 0759) Resp:  [18-20] 18 (04/08 0759) BP: (107-127)/(63-70) 123/70 (04/08 0759) SpO2:  [92 %-100 %] 100 % (04/08 0759) Weight:  [124 kg] 124 kg (04/08 0149) Last BM Date: 04/10/19  Weight change: Filed Weights   04/09/19 0353 04/10/19 0115 04/11/19 0149  Weight: 127.2 kg 127 kg 124 kg    Intake/Output:   Intake/Output Summary (Last 24 hours) at 04/11/2019 0944 Last data filed at 04/11/2019 0802 Gross per 24 hour  Intake 664 ml  Output 4725 ml  Net -4061 ml      Physical Exam    General: NAD Neck: Thick, JVP difficult but appears elevated, no thyromegaly or thyroid nodule.  Lungs: Clear to auscultation bilaterally with normal respiratory effort. CV: Nondisplaced PMI.  Heart irregular S1/S2, no S3/S4, 2/6 early SEM RUSB.  Lower legs wrapped.   Abdomen: Soft, nontender, no hepatosplenomegaly, no distention.  Skin: Intact without lesions or rashes.  Neurologic: Alert and oriented x 3.  Psych: Normal affect. Extremities: No clubbing or cyanosis.  HEENT: Normal.    Telemetry   Atrial fibrillation 60s (personally reviewed)  Labs    CBC Recent Labs    04/10/19 0411 04/11/19 0423  WBC 4.2 3.8*  HGB 9.4* 9.5*  HCT 34.1* 34.8*  MCV 88.1 90.9  PLT 143* 341*   Basic Metabolic Panel Recent Labs    04/10/19 0411 04/11/19 0423  NA 141 144  K 4.2 4.1  CL 100 99  CO2 32 36*  GLUCOSE  113* 70  BUN 88* 74*  CREATININE 2.26* 2.03*  CALCIUM 8.6* 8.9  MG 2.4 2.4   Liver Function Tests No results for input(s): AST, ALT, ALKPHOS, BILITOT, PROT, ALBUMIN in the last 72 hours. No results for input(s): LIPASE, AMYLASE in the last 72 hours. Cardiac Enzymes No results for input(s): CKTOTAL, CKMB, CKMBINDEX, TROPONINI in the last 72 hours.  BNP: BNP (last 3 results) Recent Labs    12/25/18 1902 03/28/19 0912  BNP 291.0* 276.0*    ProBNP (last 3 results) No results for input(s): PROBNP in the last 8760 hours.   D-Dimer No results for input(s): DDIMER in the last 72 hours. Hemoglobin A1C No results for input(s): HGBA1C in the last 72 hours. Fasting Lipid Panel No results for input(s): CHOL, HDL, LDLCALC, TRIG, CHOLHDL, LDLDIRECT in the last 72 hours. Thyroid Function Tests Recent Labs    04/09/19 0920  TSH 6.698*    Other results:   Imaging    NM Pulmonary Perfusion  Result Date: 04/10/2019 CLINICAL DATA:  Chronic atrial fibrillation, ischemic cardiomyopathy worsening shortness of breath, evaluate for chronic PE EXAM: NUCLEAR MEDICINE PERFUSION LUNG SCAN TECHNIQUE: Perfusion images were obtained in multiple projections after intravenous injection of radiopharmaceutical. Ventilation scans intentionally deferred if perfusion scan and chest x-ray adequate for interpretation during COVID 19 epidemic. RADIOPHARMACEUTICALS:  1.8 mCi Tc-59mMAA IV COMPARISON:  Correlation with chest radiographs dated 04/10/2019 FINDINGS: Normal lung perfusion. No wedge-shaped defects suspicious for pulmonary embolism. Corresponding chest radiographs demonstrate cardiomegaly with mild interstitial edema. Possible small left pleural effusion. IMPRESSION: No evidence of pulmonary embolism. Electronically Signed   By: SJulian HyM.D.   On: 04/10/2019 14:03   DG CHEST PORT 1 VIEW  Result Date: 04/10/2019 CLINICAL DATA:  CHF EXAM: PORTABLE CHEST 1 VIEW COMPARISON:  Portable exam 1134  hours compared to 03/28/2019 FINDINGS: Enlargement of cardiac silhouette with vascular congestion. Mediastinal contour stable. Atherosclerotic calcifications aorta. Interstitial infiltrates likely representing pulmonary edema and CHF. Cannot exclude small LEFT pleural effusion. No pneumothorax. Bones demineralized. IMPRESSION: Enlargement of cardiac silhouette with pulmonary vascular congestion and diffuse pulmonary edema consistent with CHF. Question small LEFT pleural effusion. Electronically Signed   By: MLavonia DanaM.D.   On: 04/10/2019 11:44     Medications:     Scheduled Medications: . allopurinol  300 mg Oral Daily  . atorvastatin  20 mg Oral QPM  . benzonatate  100 mg Oral BID  . ferrous sulfate  325 mg Oral Q breakfast  . insulin aspart  0-15 Units Subcutaneous TID WC  . insulin aspart  0-5 Units Subcutaneous QHS  . insulin glargine  18 Units Subcutaneous QHS  . metoprolol succinate  50 mg Oral Daily  . pantoprazole  40 mg Oral Daily  . polyethylene glycol  17 g Oral Daily  . potassium chloride SA  20 mEq Oral Daily  . sodium chloride flush  3 mL Intravenous Q12H  . sodium chloride flush  3 mL Intravenous Q12H  . Warfarin - Pharmacist Dosing Inpatient   Does not apply q1600    Infusions: . sodium chloride    . sodium chloride    . sodium chloride 10 mL/hr at 04/11/19 0554  . ferumoxytol Stopped (04/09/19 1855)  . furosemide 120 mg (04/11/19 0824)    PRN Medications: sodium chloride, sodium chloride, acetaminophen **OR** acetaminophen, acetaminophen, guaiFENesin-dextromethorphan, ondansetron **OR** ondansetron (ZOFRAN) IV, polyethylene glycol, polyvinyl alcohol, senna-docusate, sodium chloride flush, sodium chloride flush, traZODone   Assessment/Plan   1. Cor pulmonale/RV failure: Echo with EF 55-60%, RV severely dilated w/ severe RV dysfunction in the setting of severe pulmonary hypertension w/ PASP 76 mmHg. Suspect OHS/OSA.  AKI with creatinine > 2 from baseline  1.3-1.5.  I suspect that he is probably still volume overloaded.  He appears to have diuresed well overnight and creatinine is mildly lower today. Weight down.  - Continue Lasix 120 mg IV bid for now.  - May need inotrope for RV support while diuresing, but as long as creatinine decreases and he diureses well would not start.  - Will get RHC today to evaluate cardiac output, PA pressure. We discussed risks/benefits and he agrees to procedure.  2. Pulmonary HTN: PASP elevated at 76 mmHg by echo estimate. RV systolic function severely reduced. Most likely poorly controlled OHS/OSA as cause (group 3 PH).  Has not had CPAP at home for months but has been using home oxygen. V/Q scan negative for chronic PE.  - Continue home oxygen and restart CPAP.  - RHC as above.  3. AKI: Baseline SCr 1.3-1.5. 1.8 on admit. Creatinine 2.33 => 2.26 => 2.0 with diuresis. Suspect cardiorenal 2/2 severe RV failure.  - May need trial of inotrope for RV support with diuresis, but can hold off for now with reasonable diuresis overnight and lower creatinine.  4. Chronic Atrial Fibrillation:  Continue Toprol XL 50 mg daily.  Continue warfarin 5. OSA/OHS: Has not had CPAP machine but is using home oxygen.  - Restart CPAP.  6. Diabetes - Metformin on hold given AKI - cover w/ SSI  7. MGUS: Probably not cardiac amyloidosis based on echo. - Reasonable to check multiple myeloma panel   Length of Stay: 13  Loralie Champagne, MD  04/11/2019, 9:44 AM  Advanced Heart Failure Team Pager [phone] (M-F; 7a - 4p)  Please contact [facility] for night-coverage after hours (4p -7a ) and weekends on [email or web address]

## 2019-04-11 NOTE — Progress Notes (Signed)
Physical Therapy Treatment Patient Details Name: William Flores MRN: [id number] DOB: [birth year] Today's Date: 04/11/2019    History of Present Illness Pt is a 69 y.o. male admitted 03/28/19 with progressive weight gain fluid retention and was markedly volume overloaded with DOE requiring IV diuresis after failing oral diuretics as outpatient management. Workup for cor pulmonale/RV failure, pulmonary HTN, AKI. Echo: EF 55-60%, severely enlarged RV with severe systolic dysfunction. Plan for [place] 4/8. PM includes ICM, DM2, HTN, CKD, chronic afib (on anticoagulation).   PT Comments    Pt progressing with mobility. Able to progress ambulation distance and participate in LE strengthening exercise, requiring multiple seated rest breaks during activity. Remains limited by generalized weakness and decreased activity tolerance. SpO2 down to 87% on 2L O2 [place] with ambulation, HR 79.   Follow Up Recommendations  Home health PT;Supervision - Intermittent     Equipment Recommendations  None recommended by PT    Recommendations for Other Services       Precautions / Restrictions Precautions Precautions: Other (comment) Precaution Comments: Watch SpO2 Restrictions Weight Bearing Restrictions: No    Mobility  Bed Mobility Overal bed mobility: Modified Independent                Transfers Overall transfer level: Independent Equipment used: Rolling walker (2 wheeled);None             General transfer comment: Able to perform repeated sit<>stands without UE support, reliant on momentum to power into standing  Ambulation/Gait Ambulation/Gait assistance: Supervision Gait Distance (Feet): 120 Feet Assistive device: Rolling walker (2 wheeled) Gait Pattern/deviations: Step-through pattern;Decreased stride length Gait velocity: Decreased Gait velocity interpretation: <1.8 ft/sec, indicate of risk for recurrent falls General Gait Details: Slow, steady gait with RW (per pt request); pt  declined further distance secondary to fatigue and SOB. SpO2 down to 87% on 2L O2 [place]   Stairs             Wheelchair Mobility    Modified Rankin (Stroke Patients Only)       Balance Overall balance assessment: Needs assistance   Sitting balance-Leahy Scale: Good       Standing balance-Leahy Scale: Fair                              Cognition Arousal/Alertness: Awake/alert Behavior During Therapy: WFL for tasks assessed/performed Overall Cognitive Status: Within Functional Limits for tasks assessed                                        Exercises Other Exercises Other Exercises: 5x sit<>stand + 5x sit<>stand with prolonged seated rest in between Other Exercises: Demonstrated partial squats with counter support - pt declined practice secondary to fatigue    General Comments General comments (skin integrity, edema, etc.): BLE unna boots      Pertinent Vitals/Pain Pain Assessment: No/denies pain    Home Living                      Prior Function            PT Goals (current goals can now be found in the care plan section) Progress towards PT goals: Progressing toward goals    Frequency    Min 3X/week      PT Plan Current plan remains appropriate    Co-evaluation  AM-PAC PT "6 Clicks" Mobility   Outcome Measure  Help needed turning from your back to your side while in a flat bed without using bedrails?: None Help needed moving from lying on your back to sitting on the side of a flat bed without using bedrails?: None Help needed moving to and from a bed to a chair (including a wheelchair)?: None Help needed standing up from a chair using your arms (e.g., wheelchair or bedside chair)?: None Help needed to walk in hospital room?: A Little Help needed climbing 3-5 steps with a railing? : A Little 6 Click Score: 22    End of Session Equipment Utilized During Treatment: Gait  belt;Oxygen Activity Tolerance: Patient tolerated treatment well;Patient limited by fatigue Patient left: in bed;with call bell/phone within reach Nurse Communication: Mobility status PT Visit Diagnosis: Other abnormalities of gait and mobility (R26.89);Muscle weakness (generalized) (M62.81)     Time: 4580-9983 PT Time Calculation (min) (ACUTE ONLY): 18 min  Charges:  $Therapeutic Exercise: 8-22 mins                    Mabeline Caras, PT, DPT Acute Rehabilitation Services  Pager [phone] Office [place] 04/11/2019, 10:43 AM

## 2019-04-11 NOTE — Interval H&P Note (Signed)
History and Physical Interval Note:  04/11/2019 11:44 AM  William Flores  has presented today for surgery, with the diagnosis of rv failure pulm htn.  The various methods of treatment have been discussed with the patient and family. After consideration of risks, benefits and other options for treatment, the patient has consented to  Procedure(s): RIGHT HEART CATH (N/A) as a surgical intervention.  The patient's history has been reviewed, patient examined, no change in status, stable for surgery.  I have reviewed the patient's chart and labs.  Questions were answered to the patient's satisfaction.     Selia Wareing [employer]

## 2019-04-11 NOTE — Progress Notes (Signed)
Patient ID: William Flores, male   DOB: [birth year], 69 y.o.   MRN: [id number]     Advanced Heart Failure Rounding Note  PCP-Cardiologist: Minus Breeding, MD   Subjective:    Patient diuresed well again with IV Lasix.  Creatinine 2.33 => 2.26 => 2.03.  He remains in atrial fibrillation with HR in 60s.   Echo: EF 55-60%, severely enlarged RV with severe systolic dysfunction, PASP 76 mmHg.    Objective:   Weight Range: 124 kg Body mass index is 46.93 kg/m.   Vital Signs:   Temp:  [97.6 F (36.4 C)-98.4 F (36.9 C)] 97.6 F (36.4 C) (04/08 0759) Pulse Rate:  [54-71] 66 (04/08 0759) Resp:  [18-20] 18 (04/08 0759) BP: (107-127)/(63-70) 123/70 (04/08 0759) SpO2:  [92 %-100 %] 100 % (04/08 0759) Weight:  [124 kg] 124 kg (04/08 0149) Last BM Date: 04/10/19  Weight change: Filed Weights   04/09/19 0353 04/10/19 0115 04/11/19 0149  Weight: 127.2 kg 127 kg 124 kg    Intake/Output:   Intake/Output Summary (Last 24 hours) at 04/11/2019 0944 Last data filed at 04/11/2019 0802 Gross per 24 hour  Intake 664 ml  Output 4725 ml  Net -4061 ml      Physical Exam    General: NAD Neck: Thick, JVP difficult but appears elevated, no thyromegaly or thyroid nodule.  Lungs: Clear to auscultation bilaterally with normal respiratory effort. CV: Nondisplaced PMI.  Heart irregular S1/S2, no S3/S4, 2/6 early SEM RUSB.  Lower legs wrapped.   Abdomen: Soft, nontender, no hepatosplenomegaly, no distention.  Skin: Intact without lesions or rashes.  Neurologic: Alert and oriented x 3.  Psych: Normal affect. Extremities: No clubbing or cyanosis.  HEENT: Normal.    Telemetry   Atrial fibrillation 60s (personally reviewed)  Labs    CBC Recent Labs    04/10/19 0411 04/11/19 0423  WBC 4.2 3.8*  HGB 9.4* 9.5*  HCT 34.1* 34.8*  MCV 88.1 90.9  PLT 143* 341*   Basic Metabolic Panel Recent Labs    04/10/19 0411 04/11/19 0423  NA 141 144  K 4.2 4.1  CL 100 99  CO2 32 36*  GLUCOSE  113* 70  BUN 88* 74*  CREATININE 2.26* 2.03*  CALCIUM 8.6* 8.9  MG 2.4 2.4   Liver Function Tests No results for input(s): AST, ALT, ALKPHOS, BILITOT, PROT, ALBUMIN in the last 72 hours. No results for input(s): LIPASE, AMYLASE in the last 72 hours. Cardiac Enzymes No results for input(s): CKTOTAL, CKMB, CKMBINDEX, TROPONINI in the last 72 hours.  BNP: BNP (last 3 results) Recent Labs    12/25/18 1902 03/28/19 0912  BNP 291.0* 276.0*    ProBNP (last 3 results) No results for input(s): PROBNP in the last 8760 hours.   D-Dimer No results for input(s): DDIMER in the last 72 hours. Hemoglobin A1C No results for input(s): HGBA1C in the last 72 hours. Fasting Lipid Panel No results for input(s): CHOL, HDL, LDLCALC, TRIG, CHOLHDL, LDLDIRECT in the last 72 hours. Thyroid Function Tests Recent Labs    04/09/19 0920  TSH 6.698*    Other results:   Imaging    NM Pulmonary Perfusion  Result Date: 04/10/2019 CLINICAL DATA:  Chronic atrial fibrillation, ischemic cardiomyopathy worsening shortness of breath, evaluate for chronic PE EXAM: NUCLEAR MEDICINE PERFUSION LUNG SCAN TECHNIQUE: Perfusion images were obtained in multiple projections after intravenous injection of radiopharmaceutical. Ventilation scans intentionally deferred if perfusion scan and chest x-ray adequate for interpretation during COVID 19 epidemic. RADIOPHARMACEUTICALS:  1.8 mCi Tc-59mMAA IV COMPARISON:  Correlation with chest radiographs dated 04/10/2019 FINDINGS: Normal lung perfusion. No wedge-shaped defects suspicious for pulmonary embolism. Corresponding chest radiographs demonstrate cardiomegaly with mild interstitial edema. Possible small left pleural effusion. IMPRESSION: No evidence of pulmonary embolism. Electronically Signed   By: SJulian HyM.D.   On: 04/10/2019 14:03   DG CHEST PORT 1 VIEW  Result Date: 04/10/2019 CLINICAL DATA:  CHF EXAM: PORTABLE CHEST 1 VIEW COMPARISON:  Portable exam 1134  hours compared to 03/28/2019 FINDINGS: Enlargement of cardiac silhouette with vascular congestion. Mediastinal contour stable. Atherosclerotic calcifications aorta. Interstitial infiltrates likely representing pulmonary edema and CHF. Cannot exclude small LEFT pleural effusion. No pneumothorax. Bones demineralized. IMPRESSION: Enlargement of cardiac silhouette with pulmonary vascular congestion and diffuse pulmonary edema consistent with CHF. Question small LEFT pleural effusion. Electronically Signed   By: MLavonia DanaM.D.   On: 04/10/2019 11:44     Medications:     Scheduled Medications: . allopurinol  300 mg Oral Daily  . atorvastatin  20 mg Oral QPM  . benzonatate  100 mg Oral BID  . ferrous sulfate  325 mg Oral Q breakfast  . insulin aspart  0-15 Units Subcutaneous TID WC  . insulin aspart  0-5 Units Subcutaneous QHS  . insulin glargine  18 Units Subcutaneous QHS  . metoprolol succinate  50 mg Oral Daily  . pantoprazole  40 mg Oral Daily  . polyethylene glycol  17 g Oral Daily  . potassium chloride SA  20 mEq Oral Daily  . sodium chloride flush  3 mL Intravenous Q12H  . sodium chloride flush  3 mL Intravenous Q12H  . Warfarin - Pharmacist Dosing Inpatient   Does not apply q1600    Infusions: . sodium chloride    . sodium chloride    . sodium chloride 10 mL/hr at 04/11/19 0554  . ferumoxytol Stopped (04/09/19 1855)  . furosemide 120 mg (04/11/19 0824)    PRN Medications: sodium chloride, sodium chloride, acetaminophen **OR** acetaminophen, acetaminophen, guaiFENesin-dextromethorphan, ondansetron **OR** ondansetron (ZOFRAN) IV, polyethylene glycol, polyvinyl alcohol, senna-docusate, sodium chloride flush, sodium chloride flush, traZODone   Assessment/Plan   1. Cor pulmonale/RV failure: Echo with EF 55-60%, RV severely dilated w/ severe RV dysfunction in the setting of severe pulmonary hypertension w/ PASP 76 mmHg. Suspect OHS/OSA.  AKI with creatinine > 2 from baseline  1.3-1.5.  I suspect that he is probably still volume overloaded.  He appears to have diuresed well overnight and creatinine is mildly lower today. Weight down.  - Continue Lasix 120 mg IV bid for now.  - May need inotrope for RV support while diuresing, but as long as creatinine decreases and he diureses well would not start.  - Will get RHC today to evaluate cardiac output, PA pressure. We discussed risks/benefits and he agrees to procedure.  2. Pulmonary HTN: PASP elevated at 76 mmHg by echo estimate. RV systolic function severely reduced. Most likely poorly controlled OHS/OSA as cause (group 3 PH).  Has not had CPAP at home for months but has been using home oxygen. V/Q scan negative for chronic PE.  - Continue home oxygen and restart CPAP.  - RHC as above.  3. AKI: Baseline SCr 1.3-1.5. 1.8 on admit. Creatinine 2.33 => 2.26 => 2.0 with diuresis. Suspect cardiorenal 2/2 severe RV failure.  - May need trial of inotrope for RV support with diuresis, but can hold off for now with reasonable diuresis overnight and lower creatinine.  4. Chronic Atrial Fibrillation:  Continue Toprol XL 50 mg daily.  Continue warfarin 5. OSA/OHS: Has not had CPAP machine but is using home oxygen.  - Restart CPAP.  6. Diabetes - Metformin on hold given AKI - cover w/ SSI  7. MGUS: Probably not cardiac amyloidosis based on echo. - Reasonable to check multiple myeloma panel   Length of Stay: 13  Loralie Champagne, MD  04/11/2019, 9:44 AM  Advanced Heart Failure Team Pager [phone] (M-F; 7a - 4p)  Please contact [facility] for night-coverage after hours (4p -7a ) and weekends on [email or web address]

## 2019-04-12 LAB — BASIC METABOLIC PANEL
Anion gap: 10 (ref 5–15)
BUN: 62 mg/dL — ABNORMAL HIGH (ref 8–23)
CO2: 36 mmol/L — ABNORMAL HIGH (ref 22–32)
Calcium: 9 mg/dL (ref 8.9–10.3)
Chloride: 96 mmol/L — ABNORMAL LOW (ref 98–111)
Creatinine, Ser: 1.84 mg/dL — ABNORMAL HIGH (ref 0.61–1.24)
GFR calc Af Amer: 43 mL/min — ABNORMAL LOW (ref >60)
GFR calc non Af Amer: 37 mL/min — ABNORMAL LOW (ref >60)
Glucose, Bld: 120 mg/dL — ABNORMAL HIGH (ref 70–99)
Potassium: 4.1 mmol/L (ref 3.5–5.1)
Sodium: 142 mmol/L (ref 135–145)

## 2019-04-12 LAB — UPEP/UIFE/LIGHT CHAINS/TP, 24-HR UR
% BETA, Urine: 17.3 %
ALPHA 1 URINE: 5.3 %
Albumin, U: 61.4 %
Alpha 2, Urine: 6.5 %
Free Kappa Lt Chains,Ur: 57.67 mg/L (ref 0.63–113.79)
Free Kappa/Lambda Ratio: 7.24 (ref 1.03–31.76)
Free Lambda Lt Chains,Ur: 7.97 mg/L (ref 0.47–11.77)
GAMMA GLOBULIN URINE: 9.5 %
Total Protein, Urine-Ur/day: 567 mg/24 hr — ABNORMAL HIGH (ref 30–150)
Total Protein, Urine: 16.2 mg/dL
Total Volume: 3500

## 2019-04-12 LAB — PROTIME-INR
INR: 2.6 — ABNORMAL HIGH (ref 0.8–1.2)
Prothrombin Time: 27.4 seconds — ABNORMAL HIGH (ref 11.4–15.2)

## 2019-04-12 LAB — IMMUNOFIXATION, URINE

## 2019-04-12 LAB — CBC
HCT: 34.8 % — ABNORMAL LOW (ref 39.0–52.0)
Hemoglobin: 9.3 g/dL — ABNORMAL LOW (ref 13.0–17.0)
MCH: 24.2 pg — ABNORMAL LOW (ref 26.0–34.0)
MCHC: 26.7 g/dL — ABNORMAL LOW (ref 30.0–36.0)
MCV: 90.6 fL (ref 80.0–100.0)
Platelets: 123 10*3/uL — ABNORMAL LOW (ref 150–400)
RBC: 3.84 MIL/uL — ABNORMAL LOW (ref 4.22–5.81)
RDW: 20.6 % — ABNORMAL HIGH (ref 11.5–15.5)
WBC: 4.7 10*3/uL (ref 4.0–10.5)
nRBC: 0 % (ref 0.0–0.2)

## 2019-04-12 LAB — GLUCOSE, CAPILLARY
Glucose-Capillary: 92 mg/dL (ref 70–99)
Glucose-Capillary: 114 mg/dL — ABNORMAL HIGH (ref 70–99)
Glucose-Capillary: 110 mg/dL — ABNORMAL HIGH (ref 70–99)
Glucose-Capillary: 158 mg/dL — ABNORMAL HIGH (ref 70–99)

## 2019-04-12 LAB — MAGNESIUM: Magnesium: 2.2 mg/dL (ref 1.7–2.4)

## 2019-04-12 LAB — FERRITIN: Ferritin: 236 ng/mL (ref 24–336)

## 2019-04-12 MED ORDER — WARFARIN SODIUM 7.5 MG PO TABS
7.5000 mg | ORAL_TABLET | Freq: Once | ORAL | Status: AC
  Administered 2019-04-12: 7.5 mg via ORAL
  Filled 2019-04-12: qty 1

## 2019-04-12 NOTE — Progress Notes (Signed)
[place] for warfarin Indication: atrial fibrillation  Allergies  Allergen Reactions  . Codeine     Headache     Patient Measurements: Height: _0  (162.6 cm) Weight: 121.8 kg (268 lb 8 oz) IBW/kg (Calculated) : 59.2  Vital Signs: Temp: 98.1 F (36.7 C) (04/09 0408) Temp Source: Oral (04/09 0408) BP: 132/58 (04/09 0829) Pulse Rate: 74 (04/09 0829)  Labs: Recent Labs    04/10/19 0411 04/10/19 0411 04/11/19 0423 04/11/19 0423 04/11/19 1205 04/11/19 1205 04/11/19 1206 04/12/19 0440  HGB 9.4*   < > 9.5*   < > 11.6*   < > 11.6* 9.3*  HCT 34.1*   < > 34.8*   < > 34.0*  --  34.0* 34.8*  PLT 143*  --  132*  --   --   --   --  123*  LABPROT 28.1*  --  29.5*  --   --   --   --  27.4*  INR 2.6*  --  2.8*  --   --   --   --  2.6*  CREATININE 2.26*  --  2.03*  --   --   --   --  1.84*   < > = values in this interval not displayed.    Estimated Creatinine Clearance: 46.4 mL/min (A) (by C-G formula based on SCr of 1.84 mg/dL (H)).   Medical History: Past Medical History:  Diagnosis Date  . CAD (coronary artery disease)    LHC 2/08:  mRCA 50%, o/w no sig CAD, EF 25%  . Cardiomyopathy ([place])    EF 35-40% in 2/16 in setting of AF with RVR >> echo 5/16 with improved LVF with EF 65-70%  . Chronic atrial fibrillation ([place])   . Chronic diastolic heart failure (H[facility])    HFPF - EF ~65-70% (OFTEN EXACERBATED BY AFIB)  . CKD (chronic kidney disease)    hx of worsening renal failure and hyperK+ in setting of acute diast CHF >> required CVVHD in 4/16 and dialysis x 1 in 5/16  . CKD stage 3 due to type 2 diabetes mellitus ([place]) 09/17/2015  . Colon cancer ([place])   . Colon polyps 09/21/2012   Tubular adenoma  . Diabetes ([place])   . History of echocardiogram    Echo 05/16/14:  mild LVH, vigorous LVF, EF 65-70%, no RWMA, mean AV gradient 9 mmHg, MAC, mild MR, mod LAE, mild RVE, mild reduced RVF, severe RAE  . Hyperlipidemia   . Hypertension   .  Obesity hypoventilation syndrome ([place])   . Renal insufficiency   . Sleep apnea     Medications:  Medications Prior to Admission  Medication Sig Dispense Refill Last Dose  . acetaminophen (TYLENOL) 325 MG tablet Take 2 tablets (650 mg total) by mouth every 6 (six) hours as needed for mild pain (or Fever >/= 101). 12 tablet 0 03/26/2019  . allopurinol (ZYLOPRIM) 300 MG tablet TAKE 1 TABLET BY MOUTH EVERY DAY 90 tablet 0 03/28/2019  . atorvastatin (LIPITOR) 20 MG tablet 1 tablet daily 90 tablet 3 03/28/2019  . azelastine (OPTIVAR) 0.05 % ophthalmic solution PLACE 2 DROPS INTO BOTH EYES 2 (TWO) TIMES DAILY. (Patient taking differently: Place 2 drops into the right eye 2 (two) times daily as needed (dry eye from allergies). ) 6 mL 5 03/28/2019  . benzonatate (TESSALON PERLES) 100 MG capsule Take 1 capsule (100 mg total) by mouth 2 (two) times daily. (Patient taking differently: Take 100 mg by mouth  2 (two) times daily as needed. ) 60 capsule 3 03/28/2019  . DILT-XR 180 MG 24 hr capsule TAKE 1 CAPSULE BY MOUTH EVERY DAY (Patient taking differently: Take 180 mg by mouth in the morning and at bedtime. ) 90 capsule 0 03/28/2019  . FARXIGA 5 MG TABS tablet Take 5 mg by mouth daily.   03/28/2019  . ferrous sulfate 325 (65 FE) MG tablet Take 325 mg by mouth daily with breakfast.    03/28/2019  . furosemide (LASIX) 80 MG tablet Take 1 tablet (80 mg total) by mouth daily. 30 tablet 2 03/28/2019  . insulin aspart (NOVOLOG FLEXPEN) 100 UNIT/ML FlexPen insulin aspart (novoLOG) injection 0-9 Units 0-9 Units, Subcutaneous, 3 times daily with meals, CBG < 70: Implement Hypoglycemia Standing Orders and refer to Hypoglycemia Standing Orders sidebar report CBG 70 - 120: 0 units CBG 121 - 150: 1 unit CBG 151 - 200: 2 units CBG 201 - 250: 3 units CBG 251 - 300: 5 units CBG 301 - 350: 7 units CBG 351 - 400: 9 units CBG > 400 (Patient taking differently: Inject 9 Units into the skin in the morning and at bedtime. insulin aspart  (novoLOG) injection 0-9 Units 0-9 Units, Subcutaneous, 3 times daily with meals, CBG < 70: Implement Hypoglycemia Standing Orders and refer to Hypoglycemia Standing Orders sidebar report CBG 70 - 120: 0 units CBG 121 - 150: 1 unit CBG 151 - 200: 2 units CBG 201 - 250: 3 units CBG 251 - 300: 5 units CBG 301 - 350: 7 units CBG 351 - 400: 9 units CBG > 400) 15 mL 11 03/28/2019  . insulin degludec (TRESIBA FLEXTOUCH) 100 UNIT/ML SOPN FlexTouch Pen Inject 0.18 mLs (18 Units total) into the skin at bedtime. 15 pen 1 03/28/2019  . losartan (COZAAR) 100 MG tablet Take 1 tablet (100 mg total) by mouth daily. 90 tablet 0 03/28/2019  . metFORMIN (GLUCOPHAGE) 500 MG tablet Take 1 tablet (500 mg total) by mouth 2 (two) times daily with a meal. 180 tablet 1 03/28/2019  . metoprolol tartrate (LOPRESSOR) 50 MG tablet Take 1 tablet (50 mg total) by mouth 2 (two) times daily. 180 tablet 3 03/28/2019  . pantoprazole (PROTONIX) 40 MG tablet Take 1 tablet (40 mg total) by mouth daily. (Patient taking differently: Take 40 mg by mouth daily as needed. ) 30 tablet 5 03/27/2019  . polyethylene glycol (MIRALAX / GLYCOLAX) 17 g packet Take 17 g by mouth daily. (Patient taking differently: Take 17 g by mouth daily as needed. ) 14 each 0 03/25/2019  . potassium chloride SA (KLOR-CON M20) 20 MEQ tablet TAKE 1/2 TABLET (10 MEQ TOTAL) BY MOUTH DAILY. NEED OV. *INS WILL COVER 4/19 OR LATER* 45 tablet 1 03/28/2019  . TRULICITY 1.5 SW/5.4OE SOPN INJECT 1.5 MG INTO THE SKIN ONCE A WEEK. 6 mL 0 03/22/2019  . warfarin (COUMADIN) 5 MG tablet Take 1 tablet (5 mg total) by mouth every evening. (Patient taking differently: Take 5 mg by mouth every evening. 12/2018: directions were "take as directed". 01/2019: directions were "take 1 tab po qd". Patient has been taking 1 tablet every other day, alternating 1.5 tabs on the other days.) 30 tablet 3 03/28/2019  . Insulin Pen Needle 32G X 4 MM MISC Use to give insulin daily Dx E11.29 (Patient not taking: Reported  on 03/30/2019) 100 each 3 Not Taking at Unknown time  . polyvinyl alcohol (LIQUIFILM TEARS) 1.4 % ophthalmic solution Place 1 drop into both eyes as needed  for dry eyes. (Patient not taking: Reported on 03/30/2019) 15 mL 0 Not Taking at Unknown time    Assessment: Pharmacy consulted to dose warfarin in patient with atrial fibrillation.  Patient's home dose listed as alternating 5 mg and 7.5 mg doses.  INR today is 2.6. CBC stable  Goal of Therapy:  INR 2-3 Monitor platelets by anticoagulation protocol: Yes   Plan:  Warfarin 7.5 mg x 1 today (PTA dose) Monitor daily INR and s/s of bleeding.  Vertis Kelch, PharmD, [facility] PGY2 Cardiology Pharmacy Resident Phone [phone] 04/12/2019       8:37 AM  Please check [email or web address] for unit-specific pharmacist phone numbers

## 2019-04-12 NOTE — Progress Notes (Signed)
Patient refused CPAP tonight. He stated his broke at him so he is trying to get used to wearing one again. Informed patient to call if he changes his mind.

## 2019-04-12 NOTE — Progress Notes (Signed)
CARDIAC REHAB PHASE I   PRE:  Rate/Rhythm: 81 afib    BP: sitting 122/66    SaO2: 90 RA, 97 3L  MODE:  Ambulation: 145 ft   POST:  Rate/Rhythm: 96 afib    BP: sitting 114/66     SaO2: 96 3L  Ambulated with RW and 3L. Steady, slow. C/o tired legs with distance. VSS. Discussed HF booklet and low sodium. Pt receptive. He is reliant on his daughter right now for food/cooking and she buys fast food often. Discussed planning together for grocery trips and more cooking. Pt likes to cook. Will f/u. [phone]   [place], ACSM 04/12/2019 11:40 AM

## 2019-04-12 NOTE — Progress Notes (Addendum)
Patient ID: William Flores, male   DOB: 1950/11/17, 69 y.o.   MRN: [id number]     Advanced Heart Failure Rounding Note  PCP-Cardiologist: Minus Breeding, MD   Subjective:   Yesterday diuresed with IV lasix. Weight down another 5 pounds.   Feeling a little better. Denies SOB.   Echo: EF 55-60%, severely enlarged RV with severe systolic dysfunction, PASP 76 mmHg.   RHC Procedural Findings: Hemodynamics (mmHg) RA mean 16 RV 89/15 PA 95/32, mean 53 PCWP mean 23 Oxygen saturations: PA 60% AO 98% Cardiac Output (Fick) 6.05  Cardiac Index (Fick) 2.71 PVR 5 WU  Objective:   Weight Range: 121.8 kg Body mass index is 46.09 kg/m.   Vital Signs:   Temp:  [98.1 F (36.7 C)-99.4 F (37.4 C)] 98.1 F (36.7 C) (04/09 0408) Pulse Rate:  [71-93] 74 (04/09 0829) Resp:  [8-31] 20 (04/09 0409) BP: (92-148)/(41-111) 132/58 (04/09 0829) SpO2:  [91 %-99 %] 96 % (04/09 0409) Weight:  [121.8 kg] 121.8 kg (04/09 0151) Last BM Date: 04/10/19  Weight change: Filed Weights   04/10/19 0115 04/11/19 0149 04/12/19 0151  Weight: 127 kg 124 kg 121.8 kg    Intake/Output:   Intake/Output Summary (Last 24 hours) at 04/12/2019 0838 Last data filed at 04/12/2019 0647 Gross per 24 hour  Intake 480 ml  Output 3125 ml  Net -2645 ml      Physical Exam   General:   No resp difficulty HEENT: normal Neck: supple. JVP to jaw. Carotids 2+ bilat; no bruits. No lymphadenopathy or thryomegaly appreciated. Cor: PMI nondisplaced. Irregular rate & rhythm. No rubs, gallops or murmurs. Lungs: clear on 2 liters  Abdomen: soft, nontender, nondistended. No hepatosplenomegaly. No bruits or masses. Good bowel sounds. Extremities: no cyanosis, clubbing, rash, edema Neuro: alert & orientedx3, cranial nerves grossly intact. moves all 4 extremities w/o difficulty. Affect pleasant   Telemetry   A fib 70s  Labs    CBC Recent Labs    04/11/19 0423 04/11/19 1205 04/11/19 1206 04/12/19 0440  WBC 3.8*  --    --  4.7  HGB 9.5*   < > 11.6* 9.3*  HCT 34.8*   < > 34.0* 34.8*  MCV 90.9  --   --  90.6  PLT 132*  --   --  123*   < > = values in this interval not displayed.   Basic Metabolic Panel Recent Labs    04/11/19 0423 04/11/19 1205 04/11/19 1206 04/12/19 0440  NA 144   < > 144 142  K 4.1   < > 4.2 4.1  CL 99  --   --  96*  CO2 36*  --   --  36*  GLUCOSE 70  --   --  120*  BUN 74*  --   --  62*  CREATININE 2.03*  --   --  1.84*  CALCIUM 8.9  --   --  9.0  MG 2.4  --   --  2.2   < > = values in this interval not displayed.   Liver Function Tests No results for input(s): AST, ALT, ALKPHOS, BILITOT, PROT, ALBUMIN in the last 72 hours. No results for input(s): LIPASE, AMYLASE in the last 72 hours. Cardiac Enzymes No results for input(s): CKTOTAL, CKMB, CKMBINDEX, TROPONINI in the last 72 hours.  BNP: BNP (last 3 results) Recent Labs    12/25/18 1902 03/28/19 0912  BNP 291.0* 276.0*    ProBNP (last 3 results) No results for  input(s): PROBNP in the last 8760 hours.   D-Dimer No results for input(s): DDIMER in the last 72 hours. Hemoglobin A1C No results for input(s): HGBA1C in the last 72 hours. Fasting Lipid Panel No results for input(s): CHOL, HDL, LDLCALC, TRIG, CHOLHDL, LDLDIRECT in the last 72 hours. Thyroid Function Tests Recent Labs    04/09/19 0920  TSH 6.698*    Other results:   Imaging    CARDIAC CATHETERIZATION  Result Date: 04/11/2019 1. Elevated right and left heart filling pressures. 2. Severe mixed pulmonary venous/pulmonary arterial hypertension. 3. Preserved cardiac output.     Medications:     Scheduled Medications: . allopurinol  300 mg Oral Daily  . atorvastatin  20 mg Oral QPM  . benzonatate  100 mg Oral BID  . ferrous sulfate  325 mg Oral Q breakfast  . insulin aspart  0-15 Units Subcutaneous TID WC  . insulin aspart  0-5 Units Subcutaneous QHS  . insulin glargine  18 Units Subcutaneous QHS  . metoprolol succinate  50 mg Oral  Daily  . pantoprazole  40 mg Oral Daily  . polyethylene glycol  17 g Oral Daily  . potassium chloride SA  20 mEq Oral Daily  . sodium chloride flush  3 mL Intravenous Q12H  . sodium chloride flush  3 mL Intravenous Q12H  . Warfarin - Pharmacist Dosing Inpatient   Does not apply q1600    Infusions: . sodium chloride    . ferumoxytol Stopped (04/09/19 1855)  . furosemide 120 mg (04/12/19 0836)    PRN Medications: sodium chloride, acetaminophen **OR** acetaminophen, acetaminophen, guaiFENesin-dextromethorphan, ondansetron **OR** ondansetron (ZOFRAN) IV, polyethylene glycol, polyvinyl alcohol, senna-docusate, sodium chloride flush, traZODone   Assessment/Plan   1. Cor pulmonale/RV failure: Echo with EF 55-60%, RV severely dilated w/ severe RV dysfunction in the setting of severe pulmonary hypertension w/ PASP 76 mmHg. Suspect OHS/OSA.  AKI with creatinine > 2 from baseline 1.3-1.5.  - Creatinine down to 1.84  . Weight down another 5 pounds.  - RHC 4/8 with elevated filling pressures, mixed pulmonary venous/pulmonary hypertension.  - Volume status improving but remains elevated.  Continue Lasix 120 mg IV bid for now.  - May need inotrope for RV support while diuresing, but as long as creatinine decreases and he diureses well would not start.   2. Pulmonary HTN: PASP elevated at 76 mmHg by echo estimate. RV systolic function severely reduced. Most likely poorly controlled OHS/OSA as cause (group 3 PH).  Has not had CPAP at home for months but has been using home oxygen. V/Q scan negative for chronic PE.  - Mixed PVH/PAH on RHC  - Continue home oxygen and restart CPAP.   3. AKI: Baseline SCr 1.3-1.5. 1.8 on admit. Creatinine 2.33 => 2.26 => 2.0=>1.8  with diuresis. Suspect cardiorenal 2/2 severe RV failure.  - May need trial of inotrope for RV support with diuresis.  4. Chronic Atrial Fibrillation: Rate controlled. Continue Toprol XL 50 mg daily.  Continue warfarin. INR 2.6   5. OSA/OHS:  Has not had CPAP machine but is using home oxygen.  - Restart CPAP.   6. Diabetes - Metformin on hold given AKI - cover w/ SSI   7. MGUS: Probably not cardiac amyloidosis based on echo. - Reasonable to check multiple myeloma panel   Consult cardiac rehab.   Length of Stay: [place], NP  04/12/2019, 8:38 AM  Advanced Heart Failure Team Pager [phone] (M-F; 7a - 4p)  Please contact [facility]  for night-coverage after hours (4p -7a ) and weekends on [email or web address]  Patient seen with NP, agree with the above note.   Doing better today, weight down with good UOP.  Creatinine lower at 1.8.   General: NAD Neck: JVP 12-14 cm, no thyromegaly or thyroid nodule.  Lungs: Clear to auscultation bilaterally with normal respiratory effort. CV: Nonpalpable PMI.  Heart regular S1/S2, no S3/S4, no murmur.  Legs wrapped.   Abdomen: Soft, nontender, no hepatosplenomegaly, no distention.  Skin: Intact without lesions or rashes.  Neurologic: Alert and oriented x 3.  Psych: Normal affect. Extremities: No clubbing or cyanosis.  HEENT: Normal.   Continue Lasix 120 mg IV bid, still with volume overload but diuresing well and creatinine coming down.   Suspect mixed pulmonary venous/pulmonary arterial HTN on RHC, mixed group 2 and group 3 => group 3 due to OHS/OSA.  Probably not a good candidate for selective pulmonary vasodilators.  Cardiac output was preserved on RHC so he has not been started on inotrope for RV support.   Loralie Champagne 04/12/2019 8:56 AM

## 2019-04-12 NOTE — Progress Notes (Signed)
PROGRESS NOTE    William Flores  TIW:[id number] DOB: March 26, 1950 DOA: 03/28/2019 PCP: [name], [name], MD    Subjective:  The patient was seen and examined this morning, remained stable, remains on 3 L of oxygen, satting satting 100%. Status post right heart cath yesterday.  He tolerated well. No issues overnight, hemodynamically stable this morning.     --------------------------------------------------------------------------------------------------------------------------------  Brief Narrative:  69 year old with ischemic cardiomyopathy, DM2, HTN, CKD, chronic A. fib on anticoagulation, colon cancer, MGUS presented with worsening shortness of breath diagnosed with CHF exacerbation.  Initially at [facility] started on diuretics, seen by cardiology eventually recommended transferring the patient to [facility] for evaluation by CHF team.   Patient remains on IV Lasix 125 mg twice daily, diuresing well, monitoring I's and O and daily weight, Status post right heart catheterization on 04/11/2019-revealing elevated right and left heart filling pressure, severe mixed pulmonary venous and arterial hypertension, preserved cardiac output.   ------------------------------------------------------------------------------------------------------------------------------- Assessment & Plan:   Principal Problem:   Acute on chronic heart failure with preserved ejection fraction (HFpEF) (HCC) Active Problems:   Hypertensive heart disease   Morbid obesity ([place])   OSA (obstructive sleep apnea)   Nonischemic cardiomyopathy ([place])   Anticoagulated on Coumadin   Atrial fibrillation with slow ventricular response (HCC)   AKI (acute kidney injury) ([place])   DM (diabetes mellitus), type 2, uncontrolled, with renal complications ([place])   CKD stage 3 due to type 2 diabetes mellitus ([place])   Pulmonary hypertension ([place])   Acute systolic congestive heart failure (HCC)   Acute exacerbation of CHF  (congestive heart failure) (HCC)   Bilateral leg edema   Chronic renal failure  Acute congestive heart failure with preserved ejection fraction, 55-60% Cor pulmonale with severe pulmonary hypertension -Per cardiology continue Lasix 120 mig twice daily  -Plans for right heart cath... Today later this afternoon. - ?  Need of inotropic's.  -Daily weight, monitor input and output.   Reported approximately 4.6 L urine output. -Echocardiogram showed EF of 55 to 60%, right ventricular enlargement and right ventricular systolic failure with severe pulmonary hypertension. -Continue Lipitor, metoprolol --Status post right heart catheterization on 04/11/2019-revealing elevated right and left heart filling pressure, severe mixed pulmonary venous and arterial hypertension, preserved cardiac output.  -V/Q scan negative for pulmonary embolism.  Permanent atrial fibrillation -Heart rate well controlled, with Cardizem and metoprolol -Remain on Coumadin, managed per pharmacy with INR goal of 2-3, INR 2.8 today -On metoprolol and Cardizem  Acute kidney injury on CKD stage IIIa  Cardiorenal syndrome -Baseline creatinine 1.5.  Continue to monitor renal function.   Creatinine 2.33 >> 2.26 >> 2.03 today   Diabetes mellitus type 2, insulin-dependent.  Poorly controlled. -Lantus 18 units at bedtime.  Insulin sliding scale and Accu-Chek.  Obstructive sleep apnea -Likely will need outpatient evaluation of sleep study Stable on 2-3 L of oxygen, satting 92% -Patient has not had a sleep study, does not use CPAP, but uses supplemental oxygen 2 L at baseline at home.  Anemia -Suspect secondary to underlying renal disease and chronic.  - Low iron saturations.  Ferritin 52. -IV Feraheme x1 dose -Already on iron supplements, bowel regimen  GERD -PPI  DVT prophylaxis: Coumadin Code Status: Full Family Communication: None at bedside Disposition Plan:   Patient From= home  Patient Anticipated D/C place=  likely home in 1 to 2 days -once cleared by cardiology  Barriers= still quite dyspneic requiring aggressive IV diuretics eventually might require RHC.  Objective: Vitals:   04/12/19 0409 04/12/19 0829 04/12/19 0829 04/12/19 1211  BP: (!) 116/50 (!) 132/58 (!) 132/58 (!) 121/53  Pulse: 81 74 74 72  Resp: 20   16  Temp:    98.9 F (37.2 C)  TempSrc:    Oral  SpO2: 96%   100%  Weight:      Height:        Intake/Output Summary (Last 24 hours) at 04/12/2019 1241 Last data filed at 04/12/2019 1156 Gross per 24 hour  Intake 720 ml  Output 3125 ml  Net -2405 ml   Filed Weights   04/10/19 0115 04/11/19 0149 04/12/19 0151  Weight: 127 kg 124 kg 121.8 kg   BP (!) 121/53 (BP Location: Left Arm)   Pulse 72   Temp 98.9 F (37.2 C) (Oral)   Resp 16   Ht 5' 4"  (1.626 m)   Wt 121.8 kg   SpO2 100%   BMI 46.09 kg/m    Physical Exam  Constitution:  Alert, cooperative, no distress,  Psychiatric: Normal and stable mood and affect, cognition intact,   HEENT: Normocephalic, PERRL, otherwise with in Normal limits  Chest:Chest symmetric Cardio vascular:  S1/S2, RRR, No murmure, No Rubs or Gallops  pulmonary: Clear to auscultation bilaterally, respirations unlabored, negative wheezes / crackles Abdomen: Soft, non-tender, non-distended, bowel sounds,no masses, no organomegaly Muscular skeletal: Limited exam - in bed, able to move all 4 extremities, Normal strength,  Neuro: CNII-XII intact. , normal motor and sensation, reflexes intact  Extremities: +2  pitting edema lower extremities, +2 pulses  Skin: Dry, warm to touch, negative for any Rashes, No open wounds Wounds: per nursing documentation Pressure Ulcer 02/17/14 Stage II -  Partial thickness loss of dermis presenting as a shallow open ulcer with a red, pink wound bed without slough. (Active)  02/17/14 2030  Location: Sacrum  Location Orientation: Bilateral  Staging: Stage II -  Partial thickness loss of dermis presenting as a shallow  open ulcer with a red, pink wound bed without slough.  Wound Description (Comments):   Present on Admission: Yes (present on admission to [facility])       Data Reviewed:   CBC: Recent Labs  Lab 04/09/19 0755 04/09/19 0755 04/10/19 0411 04/11/19 0423 04/11/19 1205 04/11/19 1206 04/12/19 0440  WBC 4.9  --  4.2 3.8*  --   --  4.7  HGB 9.4*   < > 9.4* 9.5* 11.6* 11.6* 9.3*  HCT 33.9*   < > 34.1* 34.8* 34.0* 34.0* 34.8*  MCV 88.3  --  88.1 90.9  --   --  90.6  PLT 163  --  143* 132*  --   --  123*   < > = values in this interval not displayed.   Basic Metabolic Panel: Recent Labs  Lab 04/07/19 0538 04/07/19 0538 04/08/19 0418 04/08/19 0418 04/09/19 0755 04/09/19 0755 04/10/19 0411 04/11/19 0423 04/11/19 1205 04/11/19 1206 04/12/19 0440  NA 138   < > 140   < > 141   < > 141 144 145 144 142  K 4.0   < > 4.1   < > 4.1   < > 4.2 4.1 4.2 4.2 4.1  CL 97*   < > 97*  --  96*  --  100 99  --   --  96*  CO2 30   < > 31  --  32  --  32 36*  --   --  36*  GLUCOSE 104*   < >  122*  --  112*  --  113* 70  --   --  120*  BUN 84*   < > 89*  --  87*  --  88* 74*  --   --  62*  CREATININE 2.25*   < > 2.47*  --  2.33*  --  2.26* 2.03*  --   --  1.84*  CALCIUM 8.7*   < > 8.9  --  8.6*  --  8.6* 8.9  --   --  9.0  MG 2.5*  --  2.4  --   --   --  2.4 2.4  --   --  2.2   < > = values in this interval not displayed.   GFR: Estimated Creatinine Clearance: 46.4 mL/min (A) (by C-G formula based on SCr of 1.84 mg/dL (H)). Liver Function Tests: No results for input(s): AST, ALT, ALKPHOS, BILITOT, PROT, ALBUMIN in the last 168 hours. No results for input(s): LIPASE, AMYLASE in the last 168 hours. No results for input(s): AMMONIA in the last 168 hours. Coagulation Profile: Recent Labs  Lab 04/08/19 0418 04/09/19 0755 04/10/19 0411 04/11/19 0423 04/12/19 0440  INR 2.4* 2.4* 2.6* 2.8* 2.6*  CBG: Recent Labs  Lab 04/11/19 0652 04/11/19 1615 04/11/19 2100 04/12/19 0643 04/12/19 1211    GLUCAP 73 134* 146* 92 114*    Thyroid Function Tests: Recent Labs    04/10/19 0411  FREET4 0.74   Anemia Panel: Recent Labs    04/11/19 0423 04/12/19 0440  FERRITIN 163 236    Radiology Studies: NM Pulmonary Perfusion  Result Date: 04/10/2019 CLINICAL DATA:  Chronic atrial fibrillation, ischemic cardiomyopathy worsening shortness of breath, evaluate for chronic PE EXAM: NUCLEAR MEDICINE PERFUSION LUNG SCAN TECHNIQUE: Perfusion images were obtained in multiple projections after intravenous injection of radiopharmaceutical. Ventilation scans intentionally deferred if perfusion scan and chest x-ray adequate for interpretation during COVID 19 epidemic. RADIOPHARMACEUTICALS:  1.8 mCi Tc-50mMAA IV COMPARISON:  Correlation with chest radiographs dated 04/10/2019 FINDINGS: Normal lung perfusion. No wedge-shaped defects suspicious for pulmonary embolism. Corresponding chest radiographs demonstrate cardiomegaly with mild interstitial edema. Possible small left pleural effusion. IMPRESSION: No evidence of pulmonary embolism. Electronically Signed   By: SJulian HyM.D.   On: 04/10/2019 14:03   CARDIAC CATHETERIZATION  Result Date: 04/11/2019 1. Elevated right and left heart filling pressures. 2. Severe mixed pulmonary venous/pulmonary arterial hypertension. 3. Preserved cardiac output.        Scheduled Meds: . allopurinol  300 mg Oral Daily  . atorvastatin  20 mg Oral QPM  . benzonatate  100 mg Oral BID  . ferrous sulfate  325 mg Oral Q breakfast  . insulin aspart  0-15 Units Subcutaneous TID WC  . insulin aspart  0-5 Units Subcutaneous QHS  . insulin glargine  18 Units Subcutaneous QHS  . metoprolol succinate  50 mg Oral Daily  . pantoprazole  40 mg Oral Daily  . polyethylene glycol  17 g Oral Daily  . potassium chloride SA  20 mEq Oral Daily  . sodium chloride flush  3 mL Intravenous Q12H  . sodium chloride flush  3 mL Intravenous Q12H  . warfarin  7.5 mg Oral ONCE-1600   . Warfarin - Pharmacist Dosing Inpatient   Does not apply q1600   Continuous Infusions: . sodium chloride    . ferumoxytol Stopped (04/09/19 1855)  . furosemide 120 mg (04/12/19 0836)     LOS: 14 days   Time spent= 35 mins  Deatra James, MD [facility]  If 7PM-7AM, please contact night-coverage  04/12/2019, 12:41 PM

## 2019-04-13 LAB — CBC
HCT: 33.4 % — ABNORMAL LOW (ref 39.0–52.0)
Hemoglobin: 9 g/dL — ABNORMAL LOW (ref 13.0–17.0)
MCH: 24.2 pg — ABNORMAL LOW (ref 26.0–34.0)
MCHC: 26.9 g/dL — ABNORMAL LOW (ref 30.0–36.0)
MCV: 89.8 fL (ref 80.0–100.0)
Platelets: 117 10*3/uL — ABNORMAL LOW (ref 150–400)
RBC: 3.72 MIL/uL — ABNORMAL LOW (ref 4.22–5.81)
RDW: 20.5 % — ABNORMAL HIGH (ref 11.5–15.5)
WBC: 4.3 10*3/uL (ref 4.0–10.5)
nRBC: 0 % (ref 0.0–0.2)

## 2019-04-13 LAB — GLUCOSE, CAPILLARY
Glucose-Capillary: 70 mg/dL (ref 70–99)
Glucose-Capillary: 124 mg/dL — ABNORMAL HIGH (ref 70–99)
Glucose-Capillary: 129 mg/dL — ABNORMAL HIGH (ref 70–99)
Glucose-Capillary: 155 mg/dL — ABNORMAL HIGH (ref 70–99)

## 2019-04-13 LAB — BASIC METABOLIC PANEL
Anion gap: 11 (ref 5–15)
BUN: 52 mg/dL — ABNORMAL HIGH (ref 8–23)
CO2: 37 mmol/L — ABNORMAL HIGH (ref 22–32)
Calcium: 9.1 mg/dL (ref 8.9–10.3)
Chloride: 97 mmol/L — ABNORMAL LOW (ref 98–111)
Creatinine, Ser: 1.65 mg/dL — ABNORMAL HIGH (ref 0.61–1.24)
GFR calc Af Amer: 49 mL/min — ABNORMAL LOW (ref >60)
GFR calc non Af Amer: 42 mL/min — ABNORMAL LOW (ref >60)
Glucose, Bld: 78 mg/dL (ref 70–99)
Potassium: 3.9 mmol/L (ref 3.5–5.1)
Sodium: 145 mmol/L (ref 135–145)

## 2019-04-13 LAB — PROTIME-INR
INR: 1.8 — ABNORMAL HIGH (ref 0.8–1.2)
Prothrombin Time: 21 seconds — ABNORMAL HIGH (ref 11.4–15.2)

## 2019-04-13 LAB — FERRITIN: Ferritin: 247 ng/mL (ref 24–336)

## 2019-04-13 LAB — MAGNESIUM: Magnesium: 2.1 mg/dL (ref 1.7–2.4)

## 2019-04-13 MED ORDER — TORSEMIDE 20 MG PO TABS
40.0000 mg | ORAL_TABLET | Freq: Two times a day (BID) | ORAL | Status: DC
  Administered 2019-04-13 – 2019-04-15 (×4): 40 mg via ORAL
  Filled 2019-04-13 (×4): qty 2

## 2019-04-13 MED ORDER — GERHARDT'S BUTT CREAM
TOPICAL_CREAM | CUTANEOUS | Status: DC | PRN
  Filled 2019-04-13: qty 1

## 2019-04-13 MED ORDER — WARFARIN SODIUM 10 MG PO TABS
10.0000 mg | ORAL_TABLET | Freq: Once | ORAL | Status: AC
  Administered 2019-04-13: 10 mg via ORAL
  Filled 2019-04-13: qty 1

## 2019-04-13 NOTE — Progress Notes (Signed)
CARDIAC REHAB PHASE I  Attempted to ambulate pt. Pt was asleep. Called pt's name twice. Pt didn't wake. Cardiac Rehab will continue to follow pt.   Carma Lair MS, ACSM CEP  10:41 AM 04/13/2019

## 2019-04-13 NOTE — Progress Notes (Signed)
[place] for warfarin Indication: atrial fibrillation  Allergies  Allergen Reactions  . Codeine     Headache     Patient Measurements: Height: _0  (162.6 cm) Weight: 120.4 kg (265 lb 8 oz) IBW/kg (Calculated) : 59.2  Vital Signs: Temp: 98 F (36.7 C) (04/10 0407) Temp Source: Oral (04/10 0407) BP: 124/65 (04/10 0837) Pulse Rate: 79 (04/10 0837)  Labs: Recent Labs    04/11/19 0423 04/11/19 1205 04/11/19 1206 04/11/19 1206 04/12/19 0440 04/13/19 0430  HGB 9.5*   < > 11.6*   < > 9.3* 9.0*  HCT 34.8*   < > 34.0*  --  34.8* 33.4*  PLT 132*  --   --   --  123* 117*  LABPROT 29.5*  --   --   --  27.4* 21.0*  INR 2.8*  --   --   --  2.6* 1.8*  CREATININE 2.03*  --   --   --  1.84* 1.65*   < > = values in this interval not displayed.    Estimated Creatinine Clearance: 51.4 mL/min (A) (by C-G formula based on SCr of 1.65 mg/dL (H)).   Medical History: Past Medical History:  Diagnosis Date  . CAD (coronary artery disease)    LHC 2/08:  mRCA 50%, o/w no sig CAD, EF 25%  . Cardiomyopathy ([place])    EF 35-40% in 2/16 in setting of AF with RVR >> echo 5/16 with improved LVF with EF 65-70%  . Chronic atrial fibrillation ([place])   . Chronic diastolic heart failure (HCC)    HFPF - EF ~65-70% (OFTEN EXACERBATED BY AFIB)  . CKD (chronic kidney disease)    hx of worsening renal failure and hyperK+ in setting of acute diast CHF >> required CVVHD in 4/16 and dialysis x 1 in 5/16  . CKD stage 3 due to type 2 diabetes mellitus ([place]) 09/17/2015  . Colon cancer ([place])   . Colon polyps 09/21/2012   Tubular adenoma  . Diabetes ([place])   . History of echocardiogram    Echo 05/16/14:  mild LVH, vigorous LVF, EF 65-70%, no RWMA, mean AV gradient 9 mmHg, MAC, mild MR, mod LAE, mild RVE, mild reduced RVF, severe RAE  . Hyperlipidemia   . Hypertension   . Obesity hypoventilation syndrome ([place])   . Renal insufficiency   . Sleep apnea     Medications:   Medications Prior to Admission  Medication Sig Dispense Refill Last Dose  . acetaminophen (TYLENOL) 325 MG tablet Take 2 tablets (650 mg total) by mouth every 6 (six) hours as needed for mild pain (or Fever >/= 101). 12 tablet 0 03/26/2019  . allopurinol (ZYLOPRIM) 300 MG tablet TAKE 1 TABLET BY MOUTH EVERY DAY 90 tablet 0 03/28/2019  . atorvastatin (LIPITOR) 20 MG tablet 1 tablet daily 90 tablet 3 03/28/2019  . azelastine (OPTIVAR) 0.05 % ophthalmic solution PLACE 2 DROPS INTO BOTH EYES 2 (TWO) TIMES DAILY. (Patient taking differently: Place 2 drops into the right eye 2 (two) times daily as needed (dry eye from allergies). ) 6 mL 5 03/28/2019  . benzonatate (TESSALON PERLES) 100 MG capsule Take 1 capsule (100 mg total) by mouth 2 (two) times daily. (Patient taking differently: Take 100 mg by mouth 2 (two) times daily as needed. ) 60 capsule 3 03/28/2019  . DILT-XR 180 MG 24 hr capsule TAKE 1 CAPSULE BY MOUTH EVERY DAY (Patient taking differently: Take 180 mg by mouth in the morning and  at bedtime. ) 90 capsule 0 03/28/2019  . FARXIGA 5 MG TABS tablet Take 5 mg by mouth daily.   03/28/2019  . ferrous sulfate 325 (65 FE) MG tablet Take 325 mg by mouth daily with breakfast.    03/28/2019  . furosemide (LASIX) 80 MG tablet Take 1 tablet (80 mg total) by mouth daily. 30 tablet 2 03/28/2019  . insulin aspart (NOVOLOG FLEXPEN) 100 UNIT/ML FlexPen insulin aspart (novoLOG) injection 0-9 Units 0-9 Units, Subcutaneous, 3 times daily with meals, CBG < 70: Implement Hypoglycemia Standing Orders and refer to Hypoglycemia Standing Orders sidebar report CBG 70 - 120: 0 units CBG 121 - 150: 1 unit CBG 151 - 200: 2 units CBG 201 - 250: 3 units CBG 251 - 300: 5 units CBG 301 - 350: 7 units CBG 351 - 400: 9 units CBG > 400 (Patient taking differently: Inject 9 Units into the skin in the morning and at bedtime. insulin aspart (novoLOG) injection 0-9 Units 0-9 Units, Subcutaneous, 3 times daily with meals, CBG < 70: Implement  Hypoglycemia Standing Orders and refer to Hypoglycemia Standing Orders sidebar report CBG 70 - 120: 0 units CBG 121 - 150: 1 unit CBG 151 - 200: 2 units CBG 201 - 250: 3 units CBG 251 - 300: 5 units CBG 301 - 350: 7 units CBG 351 - 400: 9 units CBG > 400) 15 mL 11 03/28/2019  . insulin degludec (TRESIBA FLEXTOUCH) 100 UNIT/ML SOPN FlexTouch Pen Inject 0.18 mLs (18 Units total) into the skin at bedtime. 15 pen 1 03/28/2019  . losartan (COZAAR) 100 MG tablet Take 1 tablet (100 mg total) by mouth daily. 90 tablet 0 03/28/2019  . metFORMIN (GLUCOPHAGE) 500 MG tablet Take 1 tablet (500 mg total) by mouth 2 (two) times daily with a meal. 180 tablet 1 03/28/2019  . metoprolol tartrate (LOPRESSOR) 50 MG tablet Take 1 tablet (50 mg total) by mouth 2 (two) times daily. 180 tablet 3 03/28/2019  . pantoprazole (PROTONIX) 40 MG tablet Take 1 tablet (40 mg total) by mouth daily. (Patient taking differently: Take 40 mg by mouth daily as needed. ) 30 tablet 5 03/27/2019  . polyethylene glycol (MIRALAX / GLYCOLAX) 17 g packet Take 17 g by mouth daily. (Patient taking differently: Take 17 g by mouth daily as needed. ) 14 each 0 03/25/2019  . potassium chloride SA (KLOR-CON M20) 20 MEQ tablet TAKE 1/2 TABLET (10 MEQ TOTAL) BY MOUTH DAILY. NEED OV. *INS WILL COVER 4/19 OR LATER* 45 tablet 1 03/28/2019  . TRULICITY 1.5 AY/3.0ZS SOPN INJECT 1.5 MG INTO THE SKIN ONCE A WEEK. 6 mL 0 03/22/2019  . warfarin (COUMADIN) 5 MG tablet Take 1 tablet (5 mg total) by mouth every evening. (Patient taking differently: Take 5 mg by mouth every evening. 12/2018: directions were "take as directed". 01/2019: directions were "take 1 tab po qd". Patient has been taking 1 tablet every other day, alternating 1.5 tabs on the other days.) 30 tablet 3 03/28/2019  . Insulin Pen Needle 32G X 4 MM MISC Use to give insulin daily Dx E11.29 (Patient not taking: Reported on 03/30/2019) 100 each 3 Not Taking at Unknown time  . polyvinyl alcohol (LIQUIFILM TEARS) 1.4 %  ophthalmic solution Place 1 drop into both eyes as needed for dry eyes. (Patient not taking: Reported on 03/30/2019) 15 mL 0 Not Taking at Unknown time    Assessment: Pharmacy consulted to dose warfarin in patient with atrial fibrillation.  Patient's home dose listed as alternating  5 mg and 7.5 mg doses.  INR today decreased unexpectadly from 2.6 to 1.8. No drug interactions noted, but could be diet related. CBC stable. Plts slowly decreasing. Now down to 117  Goal of Therapy:  INR 2-3 Monitor platelets by anticoagulation protocol: Yes   Plan:  Warfarin 10 mg x 1 today Monitor daily INR and s/s of bleeding.  Sherren Kerns, PharmD PGY1 Acute Care Pharmacy Resident 04/13/2019       8:55 AM  Please check [email or web address] for unit-specific pharmacist phone numbers

## 2019-04-13 NOTE — Progress Notes (Addendum)
Patient has requested for drinks and snacks in between meals.  Right after dinner pt calls several times for [place] sandwiches.  Pt was educated in fluid intake pt verbalizes understanding. However does wants drink with ice and something else to eat.  Nurse spoke with dietitian regarding in between snacks for pt, will recommend snacks be brought from kitchen.   Will continue to monitor.

## 2019-04-13 NOTE — Progress Notes (Signed)
PROGRESS NOTE    William Flores  NLZ:[id number] DOB: November 04, 1950 DOA: 03/28/2019 PCP: [name], [name], MD    Subjective:  No acute events per overnight.  Patient sitting comfortably in chair on O2 via nasal cannula.  No chest pain, breathing better     --------------------------------------------------------------------------------------------------------------------------------  Brief Narrative:  69 year old with ischemic cardiomyopathy, DM2, HTN, CKD, chronic A. fib on anticoagulation, colon cancer, MGUS presented with worsening shortness of breath diagnosed with CHF exacerbation.  Initially at [facility] started on diuretics, seen by cardiology eventually recommended transferring the patient to [facility] for evaluation by CHF team.   Patient remains on IV Lasix, diuresing well, monitoring I's and O and daily weight, Status post right heart catheterization on 04/11/2019-revealing elevated right and left heart filling pressure, severe mixed pulmonary venous and arterial hypertension, preserved cardiac output.   ------------------------------------------------------------------------------------------------------------------------------- Assessment & Plan:   Principal Problem:   Acute on chronic heart failure with preserved ejection fraction (HFpEF) (HCC) Active Problems:   Hypertensive heart disease   Morbid obesity ([place])   OSA (obstructive sleep apnea)   Nonischemic cardiomyopathy ([place])   Anticoagulated on Coumadin   Atrial fibrillation with slow ventricular response (HCC)   AKI (acute kidney injury) ([place])   DM (diabetes mellitus), type 2, uncontrolled, with renal complications ([place])   CKD stage 3 due to type 2 diabetes mellitus ([place])   Pulmonary hypertension ([place])   Acute systolic congestive heart failure (HCC)   Acute exacerbation of CHF (congestive heart failure) (HCC)   Bilateral leg edema   Chronic renal failure  Acute congestive heart failure with  preserved ejection fraction, 55-60% Cor pulmonale with severe pulmonary hypertension -V/Q scan negative for chronic PE - PASP elevated at 76 mmHg by echo estimate. RV systolic function severely reduced- likely poorly controlled OHS/OSA as cause (group 3 PH).  Has not had CPAP at home for months- resume CPAP  -continue diuresis with monitoring of electrolytes and renal function  - Right heart cath completed 4/21-Mixed PVH/PAH   Hemodynamics (mmHg) RA mean 16 RV 89/15 PA 95/32, mean 53 PCWP mean 23 Oxygen saturations: PA 60% AO 98% Cardiac Output (Fick) 6.05  Cardiac Index (Fick) 2.71 PVR 5 WU - Will trial sildenafil 20 mg tid to see if he has any improvement with this  -Daily weight, monitor input and output.   -Cardiology following.  Permanent atrial fibrillation -Heart rate well controlled, with Cardizem and metoprolol -Remain on Coumadin, managed per pharmacy with INR goal of 2-3, INR 2.8 today -On metoprolol and Cardizem  Acute kidney injury on CKD stage IIIa  Cardiorenal syndrome -Baseline creatinine 1.5.  Continue to monitor renal function.   Creatinine 2.33 >> 2.26 >> 2.03 today   Diabetes mellitus type 2, insulin-dependent A1c 6.9% on 03/28/2019 Continue insulin with monitoring of glucose   Anemia -Suspect secondary to underlying renal disease and chronic.  - Low iron saturations.  Ferritin 52. -IV Feraheme given  -continue iron supplements, bowel regimen  GERD -PPI  DVT prophylaxis: Coumadin Code Status: Full Family Communication: Daughter updated at bedside Disposition Plan:   Patient From= home  Patient Anticipated D/C place= likely home soon -once cleared by cardiology       Objective: Vitals:   04/13/19 0407 04/13/19 0407 04/13/19 0837 04/13/19 1222  BP:  131/83 124/65 118/61  Pulse:  82 79 83  Resp:  20  18  Temp: 98 F (36.7 C)   98 F (36.7 C)  TempSrc: Oral Oral  Oral  SpO2:  100%  (!) 82%  Weight:  120.4 kg    Height:         Intake/Output Summary (Last 24 hours) at 04/13/2019 1353 Last data filed at 04/13/2019 1258 Gross per 24 hour  Intake 1928.89 ml  Output 5250 ml  Net -3321.11 ml   Filed Weights   04/11/19 0149 04/12/19 0151 04/13/19 0407  Weight: 124 kg 121.8 kg 120.4 kg   BP 118/61 (BP Location: Right Arm)   Pulse 83   Temp 98 F (36.7 C) (Oral)   Resp 18   Ht 5' 4"  (1.626 m)   Wt 120.4 kg   SpO2 (!) 82%   BMI 45.57 kg/m    Physical Exam  Constitution:  Alert, cooperative, no distress,  Psychiatric: Normal and stable mood and affect, cognition intact,   HEENT: Normocephalic, PERRL, otherwise with in Normal limits  Chest:Chest symmetric Cardio vascular:  S1/S2, RRR, No murmure, No Rubs or Gallops  pulmonary: Clear to auscultation bilaterally, respirations unlabored, negative wheezes / crackles Abdomen: Soft, non-tender, non-distended, bowel sounds,no masses, no organomegaly Muscular skeletal: Limited exam - in bed, able to move all 4 extremities, Normal strength,  Neuro: CNII-XII intact. , normal motor and sensation, reflexes intact  Extremities: +2  pitting edema lower extremities, +2 pulses  Skin: Dry, warm to touch, negative for any Rashes, No open wounds Wounds: per nursing documentation  Pressure Ulcer 02/17/14 Stage II -  Partial thickness loss of dermis presenting as a shallow open ulcer with a red, pink wound bed without slough. (Active)  02/17/14 2030  Location: Sacrum  Location Orientation: Bilateral  Staging: Stage II -  Partial thickness loss of dermis presenting as a shallow open ulcer with a red, pink wound bed without slough.  Wound Description (Comments):   Present on Admission: Yes (present on admission to [facility])       Data Reviewed:   CBC: Recent Labs  Lab 04/09/19 0755 04/09/19 0755 04/10/19 0411 04/10/19 0411 04/11/19 0423 04/11/19 1205 04/11/19 1206 04/12/19 0440 04/13/19 0430  WBC 4.9  --  4.2  --  3.8*  --   --  4.7 4.3  HGB 9.4*   < > 9.4*   <  > 9.5* 11.6* 11.6* 9.3* 9.0*  HCT 33.9*   < > 34.1*   < > 34.8* 34.0* 34.0* 34.8* 33.4*  MCV 88.3  --  88.1  --  90.9  --   --  90.6 89.8  PLT 163  --  143*  --  132*  --   --  123* 117*   < > = values in this interval not displayed.   Basic Metabolic Panel: Recent Labs  Lab 04/08/19 0418 04/08/19 0418 04/09/19 0755 04/09/19 0755 04/10/19 0411 04/10/19 0411 04/11/19 0423 04/11/19 1205 04/11/19 1206 04/12/19 0440 04/13/19 0430  NA 140   < > 141   < > 141   < > 144 145 144 142 145  K 4.1   < > 4.1   < > 4.2   < > 4.1 4.2 4.2 4.1 3.9  CL 97*   < > 96*  --  100  --  99  --   --  96* 97*  CO2 31   < > 32  --  32  --  36*  --   --  36* 37*  GLUCOSE 122*   < > 112*  --  113*  --  70  --   --  120* 78  BUN  89*   < > 87*  --  88*  --  74*  --   --  62* 52*  CREATININE 2.47*   < > 2.33*  --  2.26*  --  2.03*  --   --  1.84* 1.65*  CALCIUM 8.9   < > 8.6*  --  8.6*  --  8.9  --   --  9.0 9.1  MG 2.4  --   --   --  2.4  --  2.4  --   --  2.2 2.1   < > = values in this interval not displayed.   GFR: Estimated Creatinine Clearance: 51.4 mL/min (A) (by C-G formula based on SCr of 1.65 mg/dL (H)). Liver Function Tests: No results for input(s): AST, ALT, ALKPHOS, BILITOT, PROT, ALBUMIN in the last 168 hours. No results for input(s): LIPASE, AMYLASE in the last 168 hours. No results for input(s): AMMONIA in the last 168 hours. Coagulation Profile: Recent Labs  Lab 04/09/19 0755 04/10/19 0411 04/11/19 0423 04/12/19 0440 04/13/19 0430  INR 2.4* 2.6* 2.8* 2.6* 1.8*  CBG: Recent Labs  Lab 04/12/19 1211 04/12/19 1654 04/12/19 2135 04/13/19 0609 04/13/19 1124  GLUCAP 114* 110* 158* 70 124*    Thyroid Function Tests: No results for input(s): TSH, T4TOTAL, FREET4, T3FREE, THYROIDAB in the last 72 hours. Anemia Panel: Recent Labs    04/12/19 0440 04/13/19 0430  FERRITIN 236 247    Radiology Studies: No results found.      Scheduled Meds: . allopurinol  300 mg Oral Daily   . atorvastatin  20 mg Oral QPM  . benzonatate  100 mg Oral BID  . ferrous sulfate  325 mg Oral Q breakfast  . insulin aspart  0-15 Units Subcutaneous TID WC  . insulin aspart  0-5 Units Subcutaneous QHS  . insulin glargine  18 Units Subcutaneous QHS  . metoprolol succinate  50 mg Oral Daily  . pantoprazole  40 mg Oral Daily  . polyethylene glycol  17 g Oral Daily  . potassium chloride SA  20 mEq Oral Daily  . sodium chloride flush  3 mL Intravenous Q12H  . sodium chloride flush  3 mL Intravenous Q12H  . torsemide  40 mg Oral BID  . warfarin  10 mg Oral ONCE-1600  . Warfarin - Pharmacist Dosing Inpatient   Does not apply q1600   Continuous Infusions: . sodium chloride 250 mL (04/13/19 0839)     LOS: 15 days   Time spent= 35 mins    Benito Mccreedy, MD [facility]  If 7PM-7AM, please contact night-coverage  04/13/2019, 1:53 PM

## 2019-04-13 NOTE — Progress Notes (Signed)
Patient ID: William Flores, male   DOB: 12-25-1950, 69 y.o.   MRN: [id number]     Advanced Heart Failure Rounding Note  PCP-Cardiologist: Minus Breeding, MD   Subjective:    Yesterday diuresed with IV lasix. Weight down another 3 pounds.   Breathing improved.  Creatinine down to 1.65.   Echo: EF 55-60%, severely enlarged RV with severe systolic dysfunction, PASP 76 mmHg.   RHC Procedural Findings: Hemodynamics (mmHg) RA mean 16 RV 89/15 PA 95/32, mean 53 PCWP mean 23 Oxygen saturations: PA 60% AO 98% Cardiac Output (Fick) 6.05  Cardiac Index (Fick) 2.71 PVR 5 WU  Objective:   Weight Range: 120.4 kg Body mass index is 45.57 kg/m.   Vital Signs:   Temp:  [98 F (36.7 C)-98.9 F (37.2 C)] 98 F (36.7 C) (04/10 0407) Pulse Rate:  [72-82] 79 (04/10 0837) Resp:  [16-20] 20 (04/10 0407) BP: (99-131)/(53-83) 124/65 (04/10 0837) SpO2:  [96 %-100 %] 100 % (04/10 0407) Weight:  [120.4 kg] 120.4 kg (04/10 0407) Last BM Date: 04/13/19  Weight change: Filed Weights   04/11/19 0149 04/12/19 0151 04/13/19 0407  Weight: 124 kg 121.8 kg 120.4 kg    Intake/Output:   Intake/Output Summary (Last 24 hours) at 04/13/2019 1143 Last data filed at 04/13/2019 0900 Gross per 24 hour  Intake 1448.89 ml  Output 4350 ml  Net -2901.11 ml      Physical Exam  General: NAD Neck: Thick, JVP difficult (?8 range), no thyromegaly or thyroid nodule.  Lungs: Clear to auscultation bilaterally with normal respiratory effort. CV: Nondisplaced PMI.  Heart regular S1/S2, no S3/S4, no murmur.  Trace ankle edema. Abdomen: Soft, nontender, no hepatosplenomegaly, no distention.  Skin: Intact without lesions or rashes.  Neurologic: Alert and oriented x 3.  Psych: Normal affect. Extremities: No clubbing or cyanosis.  HEENT: Normal.    Telemetry   A fib 70s  Labs    CBC Recent Labs    04/12/19 0440 04/13/19 0430  WBC 4.7 4.3  HGB 9.3* 9.0*  HCT 34.8* 33.4*  MCV 90.6 89.8  PLT 123*  630*   Basic Metabolic Panel Recent Labs    04/12/19 0440 04/13/19 0430  NA 142 145  K 4.1 3.9  CL 96* 97*  CO2 36* 37*  GLUCOSE 120* 78  BUN 62* 52*  CREATININE 1.84* 1.65*  CALCIUM 9.0 9.1  MG 2.2 2.1   Liver Function Tests No results for input(s): AST, ALT, ALKPHOS, BILITOT, PROT, ALBUMIN in the last 72 hours. No results for input(s): LIPASE, AMYLASE in the last 72 hours. Cardiac Enzymes No results for input(s): CKTOTAL, CKMB, CKMBINDEX, TROPONINI in the last 72 hours.  BNP: BNP (last 3 results) Recent Labs    12/25/18 1902 03/28/19 0912  BNP 291.0* 276.0*    ProBNP (last 3 results) No results for input(s): PROBNP in the last 8760 hours.   D-Dimer No results for input(s): DDIMER in the last 72 hours. Hemoglobin A1C No results for input(s): HGBA1C in the last 72 hours. Fasting Lipid Panel No results for input(s): CHOL, HDL, LDLCALC, TRIG, CHOLHDL, LDLDIRECT in the last 72 hours. Thyroid Function Tests No results for input(s): TSH, T4TOTAL, T3FREE, THYROIDAB in the last 72 hours.  Invalid input(s): FREET3  Other results:   Imaging    No results found.   Medications:     Scheduled Medications: . allopurinol  300 mg Oral Daily  . atorvastatin  20 mg Oral QPM  . benzonatate  100 mg Oral  BID  . ferrous sulfate  325 mg Oral Q breakfast  . insulin aspart  0-15 Units Subcutaneous TID WC  . insulin aspart  0-5 Units Subcutaneous QHS  . insulin glargine  18 Units Subcutaneous QHS  . metoprolol succinate  50 mg Oral Daily  . pantoprazole  40 mg Oral Daily  . polyethylene glycol  17 g Oral Daily  . potassium chloride SA  20 mEq Oral Daily  . sodium chloride flush  3 mL Intravenous Q12H  . sodium chloride flush  3 mL Intravenous Q12H  . torsemide  40 mg Oral BID  . warfarin  10 mg Oral ONCE-1600  . Warfarin - Pharmacist Dosing Inpatient   Does not apply q1600    Infusions: . sodium chloride 250 mL (04/13/19 0839)    PRN Medications: sodium  chloride, acetaminophen **OR** acetaminophen, acetaminophen, guaiFENesin-dextromethorphan, ondansetron **OR** ondansetron (ZOFRAN) IV, polyethylene glycol, polyvinyl alcohol, senna-docusate, sodium chloride flush, traZODone   Assessment/Plan   1. Cor pulmonale/RV failure: Echo with EF 55-60%, RV severely dilated w/ severe RV dysfunction in the setting of severe pulmonary hypertension w/ PASP 76 mmHg. Suspect OHS/OSA.  AKI with creatinine > 2 from baseline 1.3-1.5.  Creatinine now down to 1.65.  RHC 4/8 with elevated filling pressures, mixed pulmonary venous/pulmonary hypertension.  Weight is now down > 20 lbs, good diuresis again yesterday.  Volume status difficult but think significantly improved. - Lasix 120 mg IV x 1 this morning then start torsemide 40 mg po bid.   2. Pulmonary HTN: PASP elevated at 76 mmHg by echo estimate. RV systolic function severely reduced. Most likely poorly controlled OHS/OSA as cause (group 3 PH).  Has not had CPAP at home for months but has been using home oxygen. V/Q scan negative for chronic PE.  Mixed PVH/PAH on RHC.   - Continue home oxygen and restart CPAP.  - Will trial sildenafil 20 mg tid to see if he has any improvement with this.  3. AKI: Baseline SCr 1.3-1.5. 1.8 on admit. Creatinine 2.33 => 2.26 => 2.0=>1.8 => 1.65  with diuresis. Suspect cardiorenal 2/2 severe RV failure.  4. Chronic Atrial Fibrillation: Rate controlled.  - Continue Toprol XL 50 mg daily.   - Continue warfarin.  5. OSA/OHS: Has not had CPAP machine but is using home oxygen.  - Restarted CPAP.  6. Diabetes - Metformin on hold given AKI - cover w/ SSI  7. MGUS: Probably not cardiac amyloidosis based on echo. - Reasonable to check multiple myeloma panel   To po meds today, possibly home tomorrow.  Will need to get back on CPAP.   Length of Stay: [place], MD  04/13/2019, 11:43 AM  Advanced Heart Failure Team Pager [phone] (M-F; 7a - 4p)  Please contact [facility]  for night-coverage after hours (4p -7a ) and weekends on [email or web address]

## 2019-04-14 LAB — GLUCOSE, CAPILLARY
Glucose-Capillary: 97 mg/dL (ref 70–99)
Glucose-Capillary: 118 mg/dL — ABNORMAL HIGH (ref 70–99)
Glucose-Capillary: 116 mg/dL — ABNORMAL HIGH (ref 70–99)
Glucose-Capillary: 148 mg/dL — ABNORMAL HIGH (ref 70–99)

## 2019-04-14 LAB — FERRITIN: Ferritin: 344 ng/mL — ABNORMAL HIGH (ref 24–336)

## 2019-04-14 LAB — CBC
HCT: 35.8 % — ABNORMAL LOW (ref 39.0–52.0)
Hemoglobin: 9.6 g/dL — ABNORMAL LOW (ref 13.0–17.0)
MCH: 24.6 pg — ABNORMAL LOW (ref 26.0–34.0)
MCHC: 26.8 g/dL — ABNORMAL LOW (ref 30.0–36.0)
MCV: 91.6 fL (ref 80.0–100.0)
Platelets: 132 K/uL — ABNORMAL LOW (ref 150–400)
RBC: 3.91 MIL/uL — ABNORMAL LOW (ref 4.22–5.81)
RDW: 21.4 % — ABNORMAL HIGH (ref 11.5–15.5)
WBC: 4.5 K/uL (ref 4.0–10.5)
nRBC: 0 % (ref 0.0–0.2)

## 2019-04-14 LAB — PROTIME-INR
INR: 2.3 — ABNORMAL HIGH (ref 0.8–1.2)
Prothrombin Time: 25.4 s — ABNORMAL HIGH (ref 11.4–15.2)

## 2019-04-14 LAB — BASIC METABOLIC PANEL
Anion gap: 9 (ref 5–15)
BUN: 51 mg/dL — ABNORMAL HIGH (ref 8–23)
CO2: 37 mmol/L — ABNORMAL HIGH (ref 22–32)
Calcium: 9.2 mg/dL (ref 8.9–10.3)
Chloride: 98 mmol/L (ref 98–111)
Creatinine, Ser: 1.79 mg/dL — ABNORMAL HIGH (ref 0.61–1.24)
GFR calc Af Amer: 44 mL/min — ABNORMAL LOW (ref >60)
GFR calc non Af Amer: 38 mL/min — ABNORMAL LOW (ref >60)
Glucose, Bld: 98 mg/dL (ref 70–99)
Potassium: 4.2 mmol/L (ref 3.5–5.1)
Sodium: 144 mmol/L (ref 135–145)

## 2019-04-14 LAB — MAGNESIUM: Magnesium: 2.1 mg/dL (ref 1.7–2.4)

## 2019-04-14 MED ORDER — SILDENAFIL CITRATE 20 MG PO TABS
20.0000 mg | ORAL_TABLET | Freq: Three times a day (TID) | ORAL | Status: DC
  Administered 2019-04-14 – 2019-04-15 (×3): 20 mg via ORAL
  Filled 2019-04-14 (×3): qty 1

## 2019-04-14 MED ORDER — WARFARIN SODIUM 5 MG PO TABS
5.0000 mg | ORAL_TABLET | Freq: Once | ORAL | Status: AC
  Administered 2019-04-14: 5 mg via ORAL
  Filled 2019-04-14: qty 1

## 2019-04-14 NOTE — Progress Notes (Signed)
Patient ID: William Flores, male   DOB: 06-07-1950, 69 y.o.   MRN: [id number]     Advanced Heart Failure Rounding Note  PCP-Cardiologist: Minus Breeding, MD   Subjective:    Weight down another 3 pounds, now on torsemide.   Breathing improved.  Creatinine fairly stable at 1.79.   Echo: EF 55-60%, severely enlarged RV with severe systolic dysfunction, PASP 76 mmHg.   RHC Procedural Findings: Hemodynamics (mmHg) RA mean 16 RV 89/15 PA 95/32, mean 53 PCWP mean 23 Oxygen saturations: PA 60% AO 98% Cardiac Output (Fick) 6.05  Cardiac Index (Fick) 2.71 PVR 5 WU  Objective:   Weight Range: 118.9 kg Body mass index is 45.01 kg/m.   Vital Signs:   Temp:  [98 F (36.7 C)-98.9 F (37.2 C)] 98.2 F (36.8 C) (04/11 0412) Pulse Rate:  [70-83] 70 (04/11 0802) Resp:  [16-18] 16 (04/11 0412) BP: (118-135)/(61-72) 132/72 (04/11 0802) SpO2:  [82 %-97 %] 94 % (04/11 0412) Weight:  [118.9 kg] 118.9 kg (04/11 0412) Last BM Date: 04/13/19  Weight change: Filed Weights   04/12/19 0151 04/13/19 0407 04/14/19 0412  Weight: 121.8 kg 120.4 kg 118.9 kg    Intake/Output:   Intake/Output Summary (Last 24 hours) at 04/14/2019 1114 Last data filed at 04/14/2019 0416 Gross per 24 hour  Intake 1440 ml  Output 4750 ml  Net -3310 ml      Physical Exam   General: NAD Neck: No JVD, no thyromegaly or thyroid nodule.  Lungs: Clear to auscultation bilaterally with normal respiratory effort. CV: Nondisplaced PMI.  Heart regular S1/S2, no S3/S4, no murmur.  Lower legs wrapped.  Abdomen: Soft, nontender, no hepatosplenomegaly, no distention.  Skin: Intact without lesions or rashes.  Neurologic: Alert and oriented x 3.  Psych: Normal affect. Extremities: No clubbing or cyanosis.  HEENT: Normal.    Telemetry   A fib 70s  Labs    CBC Recent Labs    04/13/19 0430 04/14/19 0419  WBC 4.3 4.5  HGB 9.0* 9.6*  HCT 33.4* 35.8*  MCV 89.8 91.6  PLT 117* 111*   Basic Metabolic  Panel Recent Labs    04/13/19 0430 04/14/19 0419  NA 145 144  K 3.9 4.2  CL 97* 98  CO2 37* 37*  GLUCOSE 78 98  BUN 52* 51*  CREATININE 1.65* 1.79*  CALCIUM 9.1 9.2  MG 2.1 2.1   Liver Function Tests No results for input(s): AST, ALT, ALKPHOS, BILITOT, PROT, ALBUMIN in the last 72 hours. No results for input(s): LIPASE, AMYLASE in the last 72 hours. Cardiac Enzymes No results for input(s): CKTOTAL, CKMB, CKMBINDEX, TROPONINI in the last 72 hours.  BNP: BNP (last 3 results) Recent Labs    12/25/18 1902 03/28/19 0912  BNP 291.0* 276.0*    ProBNP (last 3 results) No results for input(s): PROBNP in the last 8760 hours.   D-Dimer No results for input(s): DDIMER in the last 72 hours. Hemoglobin A1C No results for input(s): HGBA1C in the last 72 hours. Fasting Lipid Panel No results for input(s): CHOL, HDL, LDLCALC, TRIG, CHOLHDL, LDLDIRECT in the last 72 hours. Thyroid Function Tests No results for input(s): TSH, T4TOTAL, T3FREE, THYROIDAB in the last 72 hours.  Invalid input(s): FREET3  Other results:   Imaging    No results found.   Medications:     Scheduled Medications: . allopurinol  300 mg Oral Daily  . atorvastatin  20 mg Oral QPM  . benzonatate  100 mg Oral BID  .  ferrous sulfate  325 mg Oral Q breakfast  . insulin aspart  0-15 Units Subcutaneous TID WC  . insulin aspart  0-5 Units Subcutaneous QHS  . insulin glargine  18 Units Subcutaneous QHS  . metoprolol succinate  50 mg Oral Daily  . pantoprazole  40 mg Oral Daily  . polyethylene glycol  17 g Oral Daily  . potassium chloride SA  20 mEq Oral Daily  . sodium chloride flush  3 mL Intravenous Q12H  . sodium chloride flush  3 mL Intravenous Q12H  . torsemide  40 mg Oral BID  . warfarin  5 mg Oral ONCE-1600  . Warfarin - Pharmacist Dosing Inpatient   Does not apply q1600    Infusions: . sodium chloride 250 mL (04/13/19 0839)    PRN Medications: sodium chloride, acetaminophen **OR**  acetaminophen, acetaminophen, Gerhardt's butt cream, guaiFENesin-dextromethorphan, ondansetron **OR** ondansetron (ZOFRAN) IV, polyethylene glycol, polyvinyl alcohol, senna-docusate, sodium chloride flush, traZODone   Assessment/Plan   1. Cor pulmonale/RV failure: Echo with EF 55-60%, RV severely dilated w/ severe RV dysfunction in the setting of severe pulmonary hypertension w/ PASP 76 mmHg. Suspect OHS/OSA.  AKI with creatinine > 2 from baseline 1.3-1.5.  Creatinine now down to 1.65.  RHC 4/8 with elevated filling pressures, mixed pulmonary venous/pulmonary hypertension.  Weight is now down > 20 lbs, good diuresis again yesterday.  Volume status difficult but think significantly improved. - Continue torsemide 40 mg po bid.   2. Pulmonary HTN: PASP elevated at 76 mmHg by echo estimate. RV systolic function severely reduced. Most likely poorly controlled OHS/OSA as cause (group 3 PH).  Has not had CPAP at home for months but has been using home oxygen. V/Q scan negative for chronic PE.  Mixed PVH/PAH on RHC.   - Continue home oxygen and restart CPAP.  - Will trial sildenafil 20 mg tid to see if he has any improvement with this.  3. AKI: Baseline SCr 1.3-1.5. 1.8 on admit. Creatinine 2.33 => 2.26 => 2.0=>1.8 => 1.65 => 1.79  with diuresis. Suspect cardiorenal 2/2 severe RV failure.  4. Chronic Atrial Fibrillation: Rate controlled.  - Continue Toprol XL 50 mg daily.   - Continue warfarin.  5. OSA/OHS: Has not had CPAP machine but is using home oxygen.  - Restarted CPAP.  6. Diabetes - Metformin on hold given AKI - cover w/ SSI  7. MGUS: Probably not cardiac amyloidosis based on echo. - Reasonable to check multiple myeloma panel (still pending).  8. Disposition: Stable for home from my standpoint.  Cardiac meds: torsemide 40 mg bid, KCl 20 daily, Toprol XL 50 mg daily, sildenafil 20 mg tid, warfarin per coumadin clinic, atorvastatin 20 daily.  He will need followup in coumadin clinic and in CHF  clinic.   Length of Stay: 16  Dalton McLean, MD  04/14/2019, 11:14 AM  Advanced Heart Failure Team Pager [phone] (M-F; 7a - 4p)  Please contact [facility] for night-coverage after hours (4p -7a ) and weekends on [email or web address]   

## 2019-04-14 NOTE — Progress Notes (Signed)
Patient refused PPV tonight.  He was advised of the benefits and importance of wearing CPAP and was also informed that I am available to place him on it if he changes his mind.

## 2019-04-14 NOTE — Progress Notes (Signed)
[place] for warfarin Indication: atrial fibrillation  Allergies  Allergen Reactions  . Codeine     Headache     Patient Measurements: Height: _0  (162.6 cm) Weight: 118.9 kg (262 lb 3.2 oz) IBW/kg (Calculated) : 59.2  Vital Signs: Temp: 98.2 F (36.8 C) (04/11 0412) Temp Source: Oral (04/11 0412) BP: 135/71 (04/11 0412) Pulse Rate: 71 (04/11 0412)  Labs: Recent Labs    04/12/19 0440 04/12/19 0440 04/13/19 0430 04/14/19 0419  HGB 9.3*   < > 9.0* 9.6*  HCT 34.8*  --  33.4* 35.8*  PLT 123*  --  117* 132*  LABPROT 27.4*  --  21.0* 25.4*  INR 2.6*  --  1.8* 2.3*  CREATININE 1.84*  --  1.65* 1.79*   < > = values in this interval not displayed.    Estimated Creatinine Clearance: 47.1 mL/min (A) (by C-G formula based on SCr of 1.79 mg/dL (H)).   Medical History: Past Medical History:  Diagnosis Date  . CAD (coronary artery disease)    LHC 2/08:  mRCA 50%, o/w no sig CAD, EF 25%  . Cardiomyopathy ([place])    EF 35-40% in 2/16 in setting of AF with RVR >> echo 5/16 with improved LVF with EF 65-70%  . Chronic atrial fibrillation ([place])   . Chronic diastolic heart failure (HCC)    HFPF - EF ~65-70% (OFTEN EXACERBATED BY AFIB)  . CKD (chronic kidney disease)    hx of worsening renal failure and hyperK+ in setting of acute diast CHF >> required CVVHD in 4/16 and dialysis x 1 in 5/16  . CKD stage 3 due to type 2 diabetes mellitus ([place]) 09/17/2015  . Colon cancer ([place])   . Colon polyps 09/21/2012   Tubular adenoma  . Diabetes ([place])   . History of echocardiogram    Echo 05/16/14:  mild LVH, vigorous LVF, EF 65-70%, no RWMA, mean AV gradient 9 mmHg, MAC, mild MR, mod LAE, mild RVE, mild reduced RVF, severe RAE  . Hyperlipidemia   . Hypertension   . Obesity hypoventilation syndrome ([place])   . Renal insufficiency   . Sleep apnea     Medications:  Medications Prior to Admission  Medication Sig Dispense Refill Last Dose  .  acetaminophen (TYLENOL) 325 MG tablet Take 2 tablets (650 mg total) by mouth every 6 (six) hours as needed for mild pain (or Fever >/= 101). 12 tablet 0 03/26/2019  . allopurinol (ZYLOPRIM) 300 MG tablet TAKE 1 TABLET BY MOUTH EVERY DAY 90 tablet 0 03/28/2019  . atorvastatin (LIPITOR) 20 MG tablet 1 tablet daily 90 tablet 3 03/28/2019  . azelastine (OPTIVAR) 0.05 % ophthalmic solution PLACE 2 DROPS INTO BOTH EYES 2 (TWO) TIMES DAILY. (Patient taking differently: Place 2 drops into the right eye 2 (two) times daily as needed (dry eye from allergies). ) 6 mL 5 03/28/2019  . benzonatate (TESSALON PERLES) 100 MG capsule Take 1 capsule (100 mg total) by mouth 2 (two) times daily. (Patient taking differently: Take 100 mg by mouth 2 (two) times daily as needed. ) 60 capsule 3 03/28/2019  . DILT-XR 180 MG 24 hr capsule TAKE 1 CAPSULE BY MOUTH EVERY DAY (Patient taking differently: Take 180 mg by mouth in the morning and at bedtime. ) 90 capsule 0 03/28/2019  . FARXIGA 5 MG TABS tablet Take 5 mg by mouth daily.   03/28/2019  . ferrous sulfate 325 (65 FE) MG tablet Take 325 mg by mouth daily  with breakfast.    03/28/2019  . furosemide (LASIX) 80 MG tablet Take 1 tablet (80 mg total) by mouth daily. 30 tablet 2 03/28/2019  . insulin aspart (NOVOLOG FLEXPEN) 100 UNIT/ML FlexPen insulin aspart (novoLOG) injection 0-9 Units 0-9 Units, Subcutaneous, 3 times daily with meals, CBG < 70: Implement Hypoglycemia Standing Orders and refer to Hypoglycemia Standing Orders sidebar report CBG 70 - 120: 0 units CBG 121 - 150: 1 unit CBG 151 - 200: 2 units CBG 201 - 250: 3 units CBG 251 - 300: 5 units CBG 301 - 350: 7 units CBG 351 - 400: 9 units CBG > 400 (Patient taking differently: Inject 9 Units into the skin in the morning and at bedtime. insulin aspart (novoLOG) injection 0-9 Units 0-9 Units, Subcutaneous, 3 times daily with meals, CBG < 70: Implement Hypoglycemia Standing Orders and refer to Hypoglycemia Standing Orders sidebar report  CBG 70 - 120: 0 units CBG 121 - 150: 1 unit CBG 151 - 200: 2 units CBG 201 - 250: 3 units CBG 251 - 300: 5 units CBG 301 - 350: 7 units CBG 351 - 400: 9 units CBG > 400) 15 mL 11 03/28/2019  . insulin degludec (TRESIBA FLEXTOUCH) 100 UNIT/ML SOPN FlexTouch Pen Inject 0.18 mLs (18 Units total) into the skin at bedtime. 15 pen 1 03/28/2019  . losartan (COZAAR) 100 MG tablet Take 1 tablet (100 mg total) by mouth daily. 90 tablet 0 03/28/2019  . metFORMIN (GLUCOPHAGE) 500 MG tablet Take 1 tablet (500 mg total) by mouth 2 (two) times daily with a meal. 180 tablet 1 03/28/2019  . metoprolol tartrate (LOPRESSOR) 50 MG tablet Take 1 tablet (50 mg total) by mouth 2 (two) times daily. 180 tablet 3 03/28/2019  . pantoprazole (PROTONIX) 40 MG tablet Take 1 tablet (40 mg total) by mouth daily. (Patient taking differently: Take 40 mg by mouth daily as needed. ) 30 tablet 5 03/27/2019  . polyethylene glycol (MIRALAX / GLYCOLAX) 17 g packet Take 17 g by mouth daily. (Patient taking differently: Take 17 g by mouth daily as needed. ) 14 each 0 03/25/2019  . potassium chloride SA (KLOR-CON M20) 20 MEQ tablet TAKE 1/2 TABLET (10 MEQ TOTAL) BY MOUTH DAILY. NEED OV. *INS WILL COVER 4/19 OR LATER* 45 tablet 1 03/28/2019  . TRULICITY 1.5 SN/0.5LZ SOPN INJECT 1.5 MG INTO THE SKIN ONCE A WEEK. 6 mL 0 03/22/2019  . warfarin (COUMADIN) 5 MG tablet Take 1 tablet (5 mg total) by mouth every evening. (Patient taking differently: Take 5 mg by mouth every evening. 12/2018: directions were "take as directed". 01/2019: directions were "take 1 tab po qd". Patient has been taking 1 tablet every other day, alternating 1.5 tabs on the other days.) 30 tablet 3 03/28/2019  . Insulin Pen Needle 32G X 4 MM MISC Use to give insulin daily Dx E11.29 (Patient not taking: Reported on 03/30/2019) 100 each 3 Not Taking at Unknown time  . polyvinyl alcohol (LIQUIFILM TEARS) 1.4 % ophthalmic solution Place 1 drop into both eyes as needed for dry eyes. (Patient not  taking: Reported on 03/30/2019) 15 mL 0 Not Taking at Unknown time    Assessment: Pharmacy consulted to dose warfarin in patient with atrial fibrillation.  Patient's home dose listed as alternating 5 mg and 7.5 mg doses.  INR today increased from 1.8 to 2.3. No drug interactions noted, but could be diet related. CBC stable. Plts slowly increasing. Now up to 132  Goal of Therapy:  INR  2-3 Monitor platelets by anticoagulation protocol: Yes   Plan:  Warfarin 7.5 mg x 1 today Monitor daily INR and s/s of bleeding.  Sherren Kerns, PharmD PGY1 Acute Care Pharmacy Resident 04/14/2019       7:49 AM  Please check [email or web address] for unit-specific pharmacist phone numbers

## 2019-04-14 NOTE — Progress Notes (Signed)
PROGRESS NOTE    William Flores  ZSW:[id number] DOB: June 17, 1950 DOA: 03/28/2019 PCP: [name], [name], MD    Subjective:  No chest pain, no fever or chills     --------------------------------------------------------------------------------------------------------------------------------  Brief Narrative:  69 year old with ischemic cardiomyopathy, DM2, HTN, CKD, chronic A. fib on anticoagulation, colon cancer, MGUS presented with worsening shortness of breath diagnosed with CHF exacerbation.  Initially at [facility] started on diuretics, seen by cardiology eventually recommended transferring the patient to [facility] for evaluation by CHF team.   Patient remains on IV Lasix, diuresing well, monitoring I's and O and daily weight, Status post right heart catheterization on 04/11/2019-revealing elevated right and left heart filling pressure, severe mixed pulmonary venous and arterial hypertension, preserved cardiac output.   ------------------------------------------------------------------------------------------------------------------------------- Assessment & Plan:   Principal Problem:   Acute on chronic heart failure with preserved ejection fraction (HFpEF) (HCC) Active Problems:   Hypertensive heart disease   Morbid obesity ([place])   OSA (obstructive sleep apnea)   Nonischemic cardiomyopathy ([place])   Anticoagulated on Coumadin   Atrial fibrillation with slow ventricular response (HCC)   AKI (acute kidney injury) ([place])   DM (diabetes mellitus), type 2, uncontrolled, with renal complications ([place])   CKD stage 3 due to type 2 diabetes mellitus ([place])   Pulmonary hypertension ([place])   Acute systolic congestive heart failure (HCC)   Acute exacerbation of CHF (congestive heart failure) (HCC)   Bilateral leg edema   Chronic renal failure  Acute congestive heart failure with preserved ejection fraction, 55-60% Cor pulmonale with severe pulmonary hypertension -V/Q scan  negative for chronic PE - PASP elevated at 76 mmHg by echo estimate. RV systolic function severely reduced- likely poorly controlled OHS/OSA as cause (group 3 PH).  Has not had CPAP at home for months- resume CPAP  -continue diuresis with monitoring of electrolytes and renal function  - Right heart cath completed 4/21-Mixed PVH/PAH   Hemodynamics (mmHg) RA mean 16 RV 89/15 PA 95/32, mean 53 PCWP mean 23 Oxygen saturations: PA 60% AO 98% Cardiac Output (Fick) 6.05  Cardiac Index (Fick) 2.71 PVR 5 WU - Will trial sildenafil 20 mg tid to see if he has any improvement with this  -Daily weight, monitor input and output.   -Cardiology following.  Permanent atrial fibrillation -Heart rate well controlled, with Cardizem and metoprolol -Remain on Coumadin, managed per pharmacy with INR goal of 2-3, INR 2.8 today -On metoprolol and Cardizem  Acute kidney injury on CKD stage IIIa  Cardiorenal syndrome -Baseline creatinine 1.5.  Continue to monitor renal function.   Creatinine 2.33 >> 2.26 >> 2.03 today   Diabetes mellitus type 2, insulin-dependent A1c 6.9% on 03/28/2019 Continue insulin with monitoring of glucose   Anemia -Suspect secondary to underlying renal disease and chronic.  - Low iron saturations.  Ferritin 52. -IV Feraheme given  -continue iron supplements, bowel regimen  GERD -PPI  DVT prophylaxis: Coumadin Code Status: Full Family Communication: Daughter updated at bedside Disposition Plan:   Patient From= home  Patient Anticipated D/C place= likely home soon -once cleared by cardiology       Objective: Vitals:   04/13/19 1650 04/13/19 2045 04/14/19 0412 04/14/19 0802  BP:  121/62 135/71 132/72  Pulse:  79 71 70  Resp:  16 16   Temp:  98.9 F (37.2 C) 98.2 F (36.8 C)   TempSrc:  Oral Oral   SpO2: 97% 97% 94%   Weight:   118.9 kg   Height:  Intake/Output Summary (Last 24 hours) at 04/14/2019 1344 Last data filed at 04/14/2019 0416 Gross per  24 hour  Intake 960 ml  Output 2950 ml  Net -1990 ml   Filed Weights   04/12/19 0151 04/13/19 0407 04/14/19 0412  Weight: 121.8 kg 120.4 kg 118.9 kg   BP 132/72   Pulse 70   Temp 98.2 F (36.8 C) (Oral)   Resp 16   Ht 5' 4"  (1.626 m)   Wt 118.9 kg   SpO2 94%   BMI 45.01 kg/m    Physical Exam  Constitution:  Alert, cooperative, no distress,  Psychiatric: Normal and stable mood and affect, cognition intact,   HEENT: Normocephalic, PERRL, otherwise with in Normal limits  Chest:Chest symmetric Cardio vascular:  S1/S2, RRR, No murmure, No Rubs or Gallops  pulmonary: Clear to auscultation bilaterally, respirations unlabored, negative wheezes / crackles Abdomen: Soft, non-tender, non-distended, bowel sounds,no masses, no organomegaly Muscular skeletal: Limited exam - in bed, able to move all 4 extremities, Normal strength,  Neuro: CNII-XII intact. , normal motor and sensation, reflexes intact  Extremities: +2  pitting edema lower extremities, +2 pulses  Skin: Dry, warm to touch, negative for any Rashes, No open wounds Wounds: per nursing documentation  Pressure Ulcer 02/17/14 Stage II -  Partial thickness loss of dermis presenting as a shallow open ulcer with a red, pink wound bed without slough. (Active)  02/17/14 2030  Location: Sacrum  Location Orientation: Bilateral  Staging: Stage II -  Partial thickness loss of dermis presenting as a shallow open ulcer with a red, pink wound bed without slough.  Wound Description (Comments):   Present on Admission: Yes (present on admission to 2[facility])       Data Reviewed:   CBC: Recent Labs  Lab 04/10/19 0411 04/10/19 0411 04/11/19 0423 04/11/19 0423 04/11/19 1205 04/11/19 1206 04/12/19 0440 04/13/19 0430 04/14/19 0419  WBC 4.2  --  3.8*  --   --   --  4.7 4.3 4.5  HGB 9.4*   < > 9.5*   < > 11.6* 11.6* 9.3* 9.0* 9.6*  HCT 34.1*   < > 34.8*   < > 34.0* 34.0* 34.8* 33.4* 35.8*  MCV 88.1  --  90.9  --   --   --  90.6 89.8  91.6  PLT 143*  --  132*  --   --   --  123* 117* 132*   < > = values in this interval not displayed.   Basic Metabolic Panel: Recent Labs  Lab 04/10/19 0411 04/10/19 0411 04/11/19 0423 04/11/19 0423 04/11/19 1205 04/11/19 1206 04/12/19 0440 04/13/19 0430 04/14/19 0419  NA 141   < > 144   < > 145 144 142 145 144  K 4.2   < > 4.1   < > 4.2 4.2 4.1 3.9 4.2  CL 100  --  99  --   --   --  96* 97* 98  CO2 32  --  36*  --   --   --  36* 37* 37*  GLUCOSE 113*  --  70  --   --   --  120* 78 98  BUN 88*  --  74*  --   --   --  62* 52* 51*  CREATININE 2.26*  --  2.03*  --   --   --  1.84* 1.65* 1.79*  CALCIUM 8.6*  --  8.9  --   --   --  9.0 9.1  9.2  MG 2.4  --  2.4  --   --   --  2.2 2.1 2.1   < > = values in this interval not displayed.   GFR: Estimated Creatinine Clearance: 47.1 mL/min (A) (by C-G formula based on SCr of 1.79 mg/dL (H)). Liver Function Tests: No results for input(s): AST, ALT, ALKPHOS, BILITOT, PROT, ALBUMIN in the last 168 hours. No results for input(s): LIPASE, AMYLASE in the last 168 hours. No results for input(s): AMMONIA in the last 168 hours. Coagulation Profile: Recent Labs  Lab 04/10/19 0411 04/11/19 0423 04/12/19 0440 04/13/19 0430 04/14/19 0419  INR 2.6* 2.8* 2.6* 1.8* 2.3*  CBG: Recent Labs  Lab 04/13/19 1124 04/13/19 1619 04/13/19 2104 04/14/19 0602 04/14/19 1207  GLUCAP 124* 129* 155* 97 118*    Thyroid Function Tests: No results for input(s): TSH, T4TOTAL, FREET4, T3FREE, THYROIDAB in the last 72 hours. Anemia Panel: Recent Labs    04/13/19 0430 04/14/19 0419  FERRITIN 247 344*    Radiology Studies: No results found.      Scheduled Meds: . allopurinol  300 mg Oral Daily  . atorvastatin  20 mg Oral QPM  . benzonatate  100 mg Oral BID  . ferrous sulfate  325 mg Oral Q breakfast  . insulin aspart  0-15 Units Subcutaneous TID WC  . insulin aspart  0-5 Units Subcutaneous QHS  . insulin glargine  18 Units Subcutaneous QHS    . metoprolol succinate  50 mg Oral Daily  . pantoprazole  40 mg Oral Daily  . polyethylene glycol  17 g Oral Daily  . potassium chloride SA  20 mEq Oral Daily  . sildenafil  20 mg Oral TID  . sodium chloride flush  3 mL Intravenous Q12H  . sodium chloride flush  3 mL Intravenous Q12H  . torsemide  40 mg Oral BID  . warfarin  5 mg Oral ONCE-1600  . Warfarin - Pharmacist Dosing Inpatient   Does not apply q1600   Continuous Infusions: . sodium chloride 250 mL (04/13/19 0839)     LOS: 16 days   Time spent= 25 mins    Benito Mccreedy, MD Pager [phone] [facility]  If 7PM-7AM, please contact night-coverage  04/14/2019, 1:44 PM

## 2019-04-15 ENCOUNTER — Telehealth: Payer: Self-pay

## 2019-04-15 DIAGNOSIS — I5082 Biventricular heart failure: Secondary | ICD-10-CM

## 2019-04-15 DIAGNOSIS — I110 Hypertensive heart disease with heart failure: Secondary | ICD-10-CM

## 2019-04-15 DIAGNOSIS — I2609 Other pulmonary embolism with acute cor pulmonale: Secondary | ICD-10-CM

## 2019-04-15 LAB — BASIC METABOLIC PANEL
Anion gap: 11 (ref 5–15)
BUN: 61 mg/dL — ABNORMAL HIGH (ref 8–23)
CO2: 31 mmol/L (ref 22–32)
Calcium: 9.1 mg/dL (ref 8.9–10.3)
Chloride: 100 mmol/L (ref 98–111)
Creatinine, Ser: 1.74 mg/dL — ABNORMAL HIGH (ref 0.61–1.24)
GFR calc Af Amer: 46 mL/min — ABNORMAL LOW (ref >60)
GFR calc non Af Amer: 40 mL/min — ABNORMAL LOW (ref >60)
Glucose, Bld: 99 mg/dL (ref 70–99)
Potassium: 4.5 mmol/L (ref 3.5–5.1)
Sodium: 142 mmol/L (ref 135–145)

## 2019-04-15 LAB — GLUCOSE, CAPILLARY
Glucose-Capillary: 87 mg/dL (ref 70–99)
Glucose-Capillary: 129 mg/dL — ABNORMAL HIGH (ref 70–99)

## 2019-04-15 LAB — CBC
HCT: 34.6 % — ABNORMAL LOW (ref 39.0–52.0)
Hemoglobin: 9.2 g/dL — ABNORMAL LOW (ref 13.0–17.0)
MCH: 24.3 pg — ABNORMAL LOW (ref 26.0–34.0)
MCHC: 26.6 g/dL — ABNORMAL LOW (ref 30.0–36.0)
MCV: 91.3 fL (ref 80.0–100.0)
Platelets: 110 10*3/uL — ABNORMAL LOW (ref 150–400)
RBC: 3.79 MIL/uL — ABNORMAL LOW (ref 4.22–5.81)
RDW: 22.1 % — ABNORMAL HIGH (ref 11.5–15.5)
WBC: 4.6 10*3/uL (ref 4.0–10.5)
nRBC: 0 % (ref 0.0–0.2)

## 2019-04-15 LAB — MULTIPLE MYELOMA PANEL, SERUM
Albumin SerPl Elph-Mcnc: 3.2 g/dL (ref 2.9–4.4)
Albumin/Glob SerPl: 1 (ref 0.7–1.7)
Alpha 1: 0.3 g/dL (ref 0.0–0.4)
Alpha2 Glob SerPl Elph-Mcnc: 0.4 g/dL (ref 0.4–1.0)
B-Globulin SerPl Elph-Mcnc: 1.7 g/dL — ABNORMAL HIGH (ref 0.7–1.3)
Gamma Glob SerPl Elph-Mcnc: 1.1 g/dL (ref 0.4–1.8)
Globulin, Total: 3.5 g/dL (ref 2.2–3.9)
IgA: 619 mg/dL — ABNORMAL HIGH (ref 61–437)
IgG (Immunoglobin G), Serum: 1153 mg/dL (ref 603–1613)
IgM (Immunoglobulin M), Srm: 35 mg/dL (ref 20–172)
M Protein SerPl Elph-Mcnc: 0.4 g/dL — ABNORMAL HIGH
Total Protein ELP: 6.7 g/dL (ref 6.0–8.5)

## 2019-04-15 LAB — PROTIME-INR
INR: 2.6 — ABNORMAL HIGH (ref 0.8–1.2)
Prothrombin Time: 27.6 seconds — ABNORMAL HIGH (ref 11.4–15.2)

## 2019-04-15 LAB — MAGNESIUM: Magnesium: 2 mg/dL (ref 1.7–2.4)

## 2019-04-15 MED ORDER — INSULIN PEN NEEDLE 32G X 4 MM MISC: 3 refills | Status: DC

## 2019-04-15 MED ORDER — SILDENAFIL CITRATE 20 MG PO TABS: 20.0000 mg | ORAL_TABLET | Freq: Three times a day (TID) | ORAL | 3 refills | Status: DC

## 2019-04-15 MED ORDER — METOPROLOL SUCCINATE ER 50 MG PO TB24: 50.0000 mg | ORAL_TABLET | Freq: Every day | ORAL | 3 refills | Status: DC

## 2019-04-15 MED ORDER — WARFARIN SODIUM 5 MG PO TABS: 5.0000 mg | ORAL_TABLET | Freq: Once | ORAL | Status: DC

## 2019-04-15 MED ORDER — POTASSIUM CHLORIDE CRYS ER 20 MEQ PO TBCR: 20.0000 meq | EXTENDED_RELEASE_TABLET | Freq: Every day | ORAL | 3 refills | Status: DC

## 2019-04-15 MED ORDER — TORSEMIDE 20 MG PO TABS: 40.0000 mg | ORAL_TABLET | Freq: Two times a day (BID) | ORAL | 3 refills | Status: DC

## 2019-04-15 NOTE — Progress Notes (Signed)
Discharge instructions given to patient he verbalized understanding. His PIV was removed intact with dry dressing placed on patient,his tele was removed CCMD was informed of removal. [employer] has called talked with patient to send them to his daughters house set up oxygen for patient. Daughter has called talked with patient ackowledged they called her to make arrangements. Patient's meds were returned to patient and he got his Rx's so he can get his meds filled at pharmacy. Patient is waiting on daughter to arrive to pick him up.

## 2019-04-15 NOTE — TOC Initial Note (Addendum)
Transition of Care [facility]) - Initial/Assessment Note    Patient Details  Name: William Flores MRN: [id number] Date of Birth: 09/06/1950  Transition of Care [facility]) CM/SW Contact:    Zenon Mayo, RN Phone Number: 04/15/2019, 1:37 PM  Clinical Narrative:                 NCM spoke with patient daughter regarding HHPT, she states they do not want HHPT, due to covid and she has small chilren.  She asked about outpatient pt,then states she can do it with patient at home herself if NCM will talk with him to let him see he will need to work with her at home.  4/12- Patient states he still does not want [place] services and that he has home oxygen with [employer] and he needs a portable tank.  NCM asked patient if he has a concentrator at home , he states yes he has a big machine that makes oxygen at his daughter's home.  He states he will need oxygen tank to go home with.  NCM contacted Pam with [employer], she states they will get tanks to patient her in the hospital as soon as they get the ambulatory sats because he is not in billing yet, she states they have been trying to contact patient but has been unsuccessful in doing so.  NCM informed her he will be going to daughter's home at [address] [place]  [id number]   [phone]. Marland Kitchen  NCM notified Barbie Staff RN that we need ambulatory sats.   Pam with [facility] states they are also trying to get a triology for patient. [facility] has brought the oxygen tank to the room and has made sure that he will have oxygen at his daughter's home, they have been in contact with his daughter for deliviery, and he will also have a triology machine.   Expected Discharge Plan: Home/Self Care Barriers to Discharge: No Barriers Identified   Patient Goals and CMS Choice Patient states their goals for this hospitalization and ongoing recovery are:: get better   Choice offered to / list presented to : NA  Expected Discharge Plan and Services Expected Discharge Plan:  Home/Self Care   Discharge Planning Services: CM Consult Post Acute Care Choice: NA Living arrangements for the past 2 months: Single Family Home Expected Discharge Date: 04/15/19               DME Arranged: Oxygen DME Agency: [facility] Date DME Agency Contacted: 04/15/19 Time DME Agency Contacted: [phone] Representative spoke with at DME Agency: [place] Arrangements/Services Living arrangements for the past 2 months: [place] with:: Adult Children Patient language and need for interpreter reviewed:: Yes Do you feel safe going back to the place where you live?: Yes      Need for Family Participation in Patient Care: Yes (Comment) Care giver support system in place?: Yes (comment)   Criminal Activity/Legal Involvement Pertinent to Current Situation/Hospitalization: No - Comment as needed  Activities of Daily Living Home Assistive Devices/Equipment: Walker (specify type), Oxygen, CPAP, CBG Meter ADL Screening (condition at time of admission) Patient's cognitive ability adequate to safely complete daily activities?: Yes Is the patient deaf or have difficulty hearing?: No Does the patient have difficulty seeing, even when wearing glasses/contacts?: No Does the patient have difficulty concentrating, remembering, or making decisions?: No Patient able to express need for assistance with ADLs?: Yes  Does the patient have difficulty dressing or bathing?: No Independently performs ADLs?: Yes (appropriate for developmental age) Does the patient have difficulty walking or climbing stairs?: Yes Weakness of Legs: Both Weakness of Arms/Hands: None  Permission Sought/Granted                  Emotional Assessment Appearance:: Appears stated age Attitude/Demeanor/Rapport: Engaged Affect (typically observed): Appropriate Orientation: : Oriented to Self, Oriented to Place, Oriented to  Time, Oriented to Situation Alcohol / Substance Use: Not  Applicable Psych Involvement: No (comment)  Admission diagnosis:  Bilateral leg edema [J09.3] Acute systolic congestive heart failure (HCC) [I50.21] Acute on chronic systolic CHF (congestive heart failure) (HCC) [I50.23] Chronic renal failure, unspecified CKD stage [N18.9] Acute exacerbation of CHF (congestive heart failure) ([place]) [I50.9] Patient Active Problem List   Diagnosis Date Noted  . Cor pulmonale, acute ([place]) 04/15/2019  . Bilateral leg edema   . Chronic renal failure   . Acute exacerbation of CHF (congestive heart failure) ([place]) 03/29/2019  . Acute systolic congestive heart failure ([place]) 03/28/2019  . Acute on chronic heart failure with preserved ejection fraction (HFpEF) ([place]) 03/28/2019  . Pulmonary hypertension ([place]) 12/28/2018  . Acute anemia 12/25/2018  . Hyperlipidemia associated with type 2 diabetes mellitus ([place]) 05/18/2017  . Morbid obesity with BMI of 45.0-49.9, adult ([place]) 05/18/2017  . CKD stage 3 due to type 2 diabetes mellitus ([place]) 09/17/2015  . MGUS (monoclonal gammopathy of unknown significance) 05/27/2015  . Normocytic anemia 11/04/2014  . Peripheral edema   . Essential hypertension   . DM (diabetes mellitus), type 2, uncontrolled, with renal complications ([place]) 26/71/2458  . Dysphagia 04/20/2014  . Atrial fibrillation with slow ventricular response ([place]) 04/14/2014  . AKI (acute kidney injury) ([place]) 04/14/2014  . Anticoagulated on Coumadin 03/20/2014  . Nonischemic cardiomyopathy ([place]) 02/20/2014  . OSA (obstructive sleep apnea)   . Right heart failure ([place]) 02/10/2014  . Morbid obesity ([place]) 08/05/2013  . History of colon cancer   . Gout 05/25/2012  . Hypertensive heart disease    PCP:  [name], [name], MD Pharmacy:   [id number] [id number]: [phone]: [phone]    Social Determinants of Health (SDOH) Interventions    Readmission Risk  Interventions Readmission Risk Prevention Plan 04/15/2019  Transportation Screening Complete  PCP or Specialist Appt within 3-5 Days Complete  [place] or [place]  Social Work Consult for RNicholasPlanning/Counseling Complete  Palliative Care Screening Not Applicable  Medication Review (Press photographer Complete  Some recent data might be hidden

## 2019-04-15 NOTE — Care Management Important Message (Signed)
Important Message  Patient Details  Name: William Flores MRN: [id number] Date of Birth: June 14, 1950   Medicare Important Message Given:  Yes     [name] 04/15/2019, 1:41 PM

## 2019-04-15 NOTE — Telephone Encounter (Signed)
Patient is being discharged from hospital today, TOC.  Scheduled appointment with Dr. Warrick Parisian on 04/26/2019.  Please call.

## 2019-04-15 NOTE — Progress Notes (Signed)
Patient reports to writer his ride will not be here till after 530 pm this evening. We are still waiting on his oxygen to be delivered.

## 2019-04-15 NOTE — Progress Notes (Signed)
Physical Therapy Treatment Patient Details Name: William Flores MRN: [id number] DOB: 11-09-1950 Today's Date: 04/15/2019    History of Present Illness Pt is a 69 y.o. male admitted 03/28/19 with progressive weight gain fluid retention and was markedly volume overloaded with DOE requiring IV diuresis after failing oral diuretics as outpatient management. Workup for cor pulmonale/RV failure, pulmonary HTN, AKI. Echo: EF 55-60%, severely enlarged RV with severe systolic dysfunction. Plan for [place] 4/8. PM includes ICM, DM2, HTN, CKD, chronic afib (on anticoagulation).    PT Comments    Pt sitting on EoB on entry agreeable to working with therapy and is making good progress towards her goals. Pt continues to be limited in safe mobility by DoE and decreased strength and endurence. Pt is currently mod I for transfers, and supervision for ambulation of 300 feet and ascent/descent of 3 steps with bilateral handrails. D/c plans remain appropriate. Pt hopeful for d/c home this afternoon, PT will continue to follow acutely.   Follow Up Recommendations  Home health PT;Supervision - Intermittent     Equipment Recommendations  None recommended by PT       Precautions / Restrictions Precautions Precautions: Other (comment) Precaution Comments: Watch SpO2 Restrictions Weight Bearing Restrictions: No    Mobility  Bed Mobility              General bed mobility comments: sitting EoB on entry   Transfers Overall transfer level: Modified independent Equipment used: Rolling walker (2 wheeled);None             General transfer comment: sit>stand with increased momentum, able to come into standing without UE support  Ambulation/Gait Ambulation/Gait assistance: Supervision Gait Distance (Feet): 300 Feet Assistive device: Rolling walker (2 wheeled) Gait Pattern/deviations: Step-through pattern;Decreased stride length Gait velocity: Decreased Gait velocity interpretation: 1.31 - 2.62 ft/sec,  indicative of limited community ambulator General Gait Details: Slow, steady gait with RW,    Stairs Stairs: Yes Stairs assistance: Supervision Stair Management: Two rails;Alternating pattern;Forwards Number of Stairs: 3 General stair comments: strong, steady ascent/descent of stairs, requires assist for O2 tank and line management          Balance Overall balance assessment: Needs assistance   Sitting balance-Leahy Scale: Good       Standing balance-Leahy Scale: Fair                              Cognition Arousal/Alertness: Awake/alert Behavior During Therapy: WFL for tasks assessed/performed Overall Cognitive Status: Within Functional Limits for tasks assessed                                           General Comments General comments (skin integrity, edema, etc.): SaO2 on 2L O2 via [place] SaO2 with ambulation 91%O2, max HR 101 bpm      Pertinent Vitals/Pain Pain Assessment: No/denies pain           PT Goals (current goals can now be found in the care plan section) Acute Rehab PT Goals PT Goal Formulation: With patient Time For Goal Achievement: 04/13/19 Potential to Achieve Goals: Good Progress towards PT goals: Progressing toward goals    Frequency    Min 3X/week      PT Plan Current plan remains appropriate       AM-PAC PT "6 Clicks" Mobility   Outcome Measure  Help  needed turning from your back to your side while in a flat bed without using bedrails?: None Help needed moving from lying on your back to sitting on the side of a flat bed without using bedrails?: None Help needed moving to and from a bed to a chair (including a wheelchair)?: None Help needed standing up from a chair using your arms (e.g., wheelchair or bedside chair)?: None Help needed to walk in hospital room?: A Little Help needed climbing 3-5 steps with a railing? : A Little 6 Click Score: 22    End of Session Equipment Utilized During Treatment:  Gait belt;Oxygen Activity Tolerance: Patient tolerated treatment well;Patient limited by fatigue Patient left: in bed;with call bell/phone within reach Nurse Communication: Mobility status PT Visit Diagnosis: Other abnormalities of gait and mobility (R26.89);Muscle weakness (generalized) (M62.81)     Time: 8676-1950 PT Time Calculation (min) (ACUTE ONLY): 18 min  Charges:  $Gait Training: 8-22 mins                     Keimora Swartout B. Migdalia Dk PT, DPT Acute Rehabilitation Services Pager [phone] Office [phone]    [place] 04/15/2019, 11:53 AM

## 2019-04-15 NOTE — Progress Notes (Signed)
SATURATION QUALIFICATIONS: (This note is used to comply with regulatory documentation for home oxygen)  Patient Saturations on Room Air at Rest = 88%  Patient Saturations on Room Air while Ambulating = 82%  Patient Saturations on 2 Liters of oxygen while Ambulating = 94%  Please briefly explain why patient needs home oxygen:Patient did not make it to the door of his room without desaturating in the low 80's he decompensates quickly and will require the o2 for home use for his ADL's. His nailbeds became cyanotic and his skin color became ashen I strongly recommend home o2 for the patient.

## 2019-04-15 NOTE — Progress Notes (Signed)
[place] for warfarin Indication: atrial fibrillation  Allergies  Allergen Reactions  . Codeine     Headache     Patient Measurements: Height: 5' 4"  (162.6 cm) Weight: 119 kg (262 lb 6.4 oz) IBW/kg (Calculated) : 59.2  Vital Signs: Temp: 97.8 F (36.6 C) (04/12 0458) Temp Source: Oral (04/12 0458) BP: 128/72 (04/12 0458) Pulse Rate: 84 (04/12 0458)  Labs: Recent Labs    04/13/19 0430 04/13/19 0430 04/14/19 0419 04/15/19 0553  HGB 9.0*   < > 9.6* 9.2*  HCT 33.4*  --  35.8* 34.6*  PLT 117*  --  132* 110*  LABPROT 21.0*  --  25.4* 27.6*  INR 1.8*  --  2.3* 2.6*  CREATININE 1.65*  --  1.79* 1.74*   < > = values in this interval not displayed.    Estimated Creatinine Clearance: 48.4 mL/min (A) (by C-G formula based on SCr of 1.74 mg/dL (H)).  Assessment: 69 yo m with history of afib on warfarin.  Patient's home dose listed as alternating 5 mg and 7.5 mg doses.  INR 2.6 today  Cbc stable  Goal of Therapy:  INR 2-3 Monitor platelets by anticoagulation protocol: Yes   Plan:  Warfarin 5 mg x 1 today Monitor daily INR and s/s of bleeding.  Barth Kirks, PharmD, BCPS, BCCCP Clinical Pharmacist [phone]  Please check AMION for all [place] numbers  04/15/2019 10:35 AM

## 2019-04-15 NOTE — Progress Notes (Signed)
Patient refused CPAP tonight

## 2019-04-15 NOTE — Discharge Summary (Addendum)
Physician Discharge Summary  William Flores WJX:[id number] DOB: 10-24-1950 DOA: 03/28/2019  PCP: [name], [name], MD  Admit date: 03/28/2019 Discharge date: 04/15/2019  Admitted From: Home Disposition:  Home  Recommendations for Outpatient Follow-up:  1. Follow up with heart failure team in 1-2 weeks 2. Follow-up with the Coumadin clinic in 1 week.  Home Health:yes Equipment/Devices:Oxygen  Discharge Condition:Stable CODE STATUS:Full Diet recommendation: [facility]   Brief/Interim Summary:  69 y.o. male past medical history of ischemic cardiomyopathy, diabetes mellitus essential hypertension, chronic kidney disease stage III chronic atrial fibrillation on anticoagulation, MGUS presents with shortness of breath possibly acute decompensated systolic heart failure.  She was seen by cardiology at [facility] who recommended transfer to [facility], she was started on IV Lasix, she is status post right heart cath on 04/11/2019 revealing elevated right and left filling pressures, with a preserved cardiac output.  Discharge Diagnoses:  Principal Problem:   Acute on chronic heart failure with preserved ejection fraction (HFpEF) (HCC) Active Problems:   Hypertensive heart disease   Morbid obesity (HCC)   OSA (obstructive sleep apnea)   Nonischemic cardiomyopathy (HCC)   Anticoagulated on Coumadin   Atrial fibrillation with slow ventricular response (HCC)   AKI (acute kidney injury) ([place])   DM (diabetes mellitus), type 2, uncontrolled, with renal complications ([place])   CKD stage 3 due to type 2 diabetes mellitus ([place])   Pulmonary hypertension ([place])   Acute systolic congestive heart failure (HCC)   Acute exacerbation of CHF (congestive heart failure) (HCC)   Bilateral leg edema   Chronic renal failure   Cor pulmonale, acute (HCC)  Acute cor pulmonale/right ventricular failure: With a 2D echo that showed an EF of 55% with severely dilated right ventricle and severe right  ventricular dysfunction in the setting of severe pulmonary hypertension he was started on IV Lasix and he diuresed well he is down 20 pounds. Cardiology recommended to continue torsemide as an outpatient.  Pulmonary hypertension: With an elevated pulmonary arterial pressure of 76 mmHg by echo and right heart cath, noncompliant with CPAP at home VQ scan negative for chronic PE. Right heart cath showed mixed pulmonary picture of pulmonary venous hypertension and pulmonary arterial hypertension. He will continue oxygen at home, with sildenafil 20 mg 3 times daily.  Acute kidney injury on chronic kidney disease stage IIIa:  Slight with a baseline creatinine 1.3-1.5, on admission 1.8, suspect cardiorenal syndrome due to severe biventricular failure. On discharge his creatinine remained at 1.7 we will continue current regimen of torsemide.  Chronic atrial fibrillation: Continue Toprol, heart rate controlled and Coumadin 5 mg daily.  Obstructive sleep apnea/obesity hypoventilation syndrome: He is noncompliant with his CPAP at home this has been insisted upon that he needs to wear his CPAP.  Diabetes mellitus type 2:  A1c of 6.9, he will go home on insulin, follow-up with PCP can resume Metformin as an outpatient if creatinine less than 1.5.  Anemia of chronic disease: Likely due to chronic renal disease he was given IV Feraheme and continue oral supplementation as an outpatient.  Sacral decubitus ulcer stage II present on admission.    Discharge Instructions  Discharge Instructions    Diet - low sodium heart healthy   Complete by: As directed    Increase activity slowly   Complete by: As directed      Allergies as of 04/15/2019      Reactions   Codeine    Headache      Medication List  STOP taking these medications   Dilt-XR 180 MG 24 hr capsule Generic drug: diltiazem   furosemide 80 MG tablet Commonly known as: LASIX   losartan 100 MG tablet Commonly known as:  COZAAR   metFORMIN 500 MG tablet Commonly known as: GLUCOPHAGE   metoprolol tartrate 50 MG tablet Commonly known as: LOPRESSOR   pantoprazole 40 MG tablet Commonly known as: PROTONIX     TAKE these medications   acetaminophen 325 MG tablet Commonly known as: TYLENOL Take 2 tablets (650 mg total) by mouth every 6 (six) hours as needed for mild pain (or Fever >/= 101).   allopurinol 300 MG tablet Commonly known as: ZYLOPRIM TAKE 1 TABLET BY MOUTH EVERY DAY   atorvastatin 20 MG tablet Commonly known as: LIPITOR 1 tablet daily   azelastine 0.05 % ophthalmic solution Commonly known as: OPTIVAR PLACE 2 DROPS INTO BOTH EYES 2 (TWO) TIMES DAILY. What changed: See the new instructions.   benzonatate 100 MG capsule Commonly known as: Tessalon Perles Take 1 capsule (100 mg total) by mouth 2 (two) times daily. What changed:   when to take this  reasons to take this   Farxiga 5 MG Tabs tablet Generic drug: dapagliflozin propanediol Take 5 mg by mouth daily.   ferrous sulfate 325 (65 FE) MG tablet Take 325 mg by mouth daily with breakfast.   Insulin Pen Needle 32G X 4 MM Misc Use to give insulin daily Dx E11.29   metoprolol succinate 50 MG 24 hr tablet Commonly known as: TOPROL-XL Take 1 tablet (50 mg total) by mouth daily. Take with or immediately following a meal. Start taking on: April 16, 2019   NovoLOG FlexPen 100 UNIT/ML FlexPen Generic drug: insulin aspart insulin aspart (novoLOG) injection 0-9 Units 0-9 Units, Subcutaneous, 3 times daily with meals, CBG < 70: Implement Hypoglycemia Standing Orders and refer to Hypoglycemia Standing Orders sidebar report CBG 70 - 120: 0 units CBG 121 - 150: 1 unit CBG 151 - 200: 2 units CBG 201 - 250: 3 units CBG 251 - 300: 5 units CBG 301 - 350: 7 units CBG 351 - 400: 9 units CBG > 400 What changed:   how much to take  how to take this  when to take this   polyethylene glycol 17 g packet Commonly known as: MIRALAX /  GLYCOLAX Take 17 g by mouth daily. What changed:   when to take this  reasons to take this   polyvinyl alcohol 1.4 % ophthalmic solution Commonly known as: LIQUIFILM TEARS Place 1 drop into both eyes as needed for dry eyes.   potassium chloride SA 20 MEQ tablet Commonly known as: Klor-Con M20 Take 1 tablet (20 mEq total) by mouth daily. Start taking on: April 16, 2019 What changed:   how much to take  how to take this  when to take this  additional instructions   sildenafil 20 MG tablet Commonly known as: REVATIO Take 1 tablet (20 mg total) by mouth 3 (three) times daily.   torsemide 20 MG tablet Commonly known as: DEMADEX Take 2 tablets (40 mg total) by mouth 2 (two) times daily.   Tyler Aas FlexTouch 100 UNIT/ML FlexTouch Pen Generic drug: insulin degludec Inject 0.18 mLs (18 Units total) into the skin at bedtime.   Trulicity 1.5 CB/4.4HQ Sopn Generic drug: Dulaglutide INJECT 1.5 MG INTO THE SKIN ONCE A WEEK.   warfarin 5 MG tablet Commonly known as: COUMADIN Take as directed. If you are unsure how to take this medication,  talk to your nurse or doctor. Original instructions: Take 1 tablet (5 mg total) by mouth every evening. What changed: additional instructions      Follow-up Information    [name], [name], MD.   Specialties: Family Medicine, Cardiology Contact information: [place] [address] [phone]          Allergies  Allergen Reactions  . Codeine     Headache     Consultations:  Advanced heart failure team   Procedures/Studies: DG Chest 2 View  Result Date: 03/28/2019 CLINICAL DATA:  Shortness of breath EXAM: CHEST - 2 VIEW COMPARISON:  12/25/2018 FINDINGS: Cardiac shadow is enlarged but stable. Aortic calcifications are again seen. Central vascular congestion is noted with mild interstitial edema. No focal infiltrate is seen. Chronic pleural thickening on the left is noted. No acute bony abnormality is seen.  IMPRESSION: Changes consistent with mild CHF. Chronic pleural thickening on the left is noted. Electronically Signed   By: Inez Catalina M.D.   On: 03/28/2019 09:46   NM Pulmonary Perfusion  Result Date: 04/10/2019 CLINICAL DATA:  Chronic atrial fibrillation, ischemic cardiomyopathy worsening shortness of breath, evaluate for chronic PE EXAM: NUCLEAR MEDICINE PERFUSION LUNG SCAN TECHNIQUE: Perfusion images were obtained in multiple projections after intravenous injection of radiopharmaceutical. Ventilation scans intentionally deferred if perfusion scan and chest x-ray adequate for interpretation during COVID 19 epidemic. RADIOPHARMACEUTICALS:  1.8 mCi Tc-81mMAA IV COMPARISON:  Correlation with chest radiographs dated 04/10/2019 FINDINGS: Normal lung perfusion. No wedge-shaped defects suspicious for pulmonary embolism. Corresponding chest radiographs demonstrate cardiomegaly with mild interstitial edema. Possible small left pleural effusion. IMPRESSION: No evidence of pulmonary embolism. Electronically Signed   By: SJulian HyM.D.   On: 04/10/2019 14:03   CARDIAC CATHETERIZATION  Result Date: 04/11/2019 1. Elevated right and left heart filling pressures. 2. Severe mixed pulmonary venous/pulmonary arterial hypertension. 3. Preserved cardiac output.   DG CHEST PORT 1 VIEW  Result Date: 04/10/2019 CLINICAL DATA:  CHF EXAM: PORTABLE CHEST 1 VIEW COMPARISON:  Portable exam 1134 hours compared to 03/28/2019 FINDINGS: Enlargement of cardiac silhouette with vascular congestion. Mediastinal contour stable. Atherosclerotic calcifications aorta. Interstitial infiltrates likely representing pulmonary edema and CHF. Cannot exclude small LEFT pleural effusion. No pneumothorax. Bones demineralized. IMPRESSION: Enlargement of cardiac silhouette with pulmonary vascular congestion and diffuse pulmonary edema consistent with CHF. Question small LEFT pleural effusion. Electronically Signed   By: MLavonia DanaM.D.   On:  04/10/2019 11:44   ECHOCARDIOGRAM COMPLETE  Result Date: 04/01/2019    ECHOCARDIOGRAM REPORT   Patient Name:   REDIEL UNANGSTDate of Exam: 04/01/2019 Medical Rec #:  [id number]     Height:       64.0 in Accession #:    [id number]    Weight:       287.2 lb Date of Birth:  106/04/1950     BSA:          2.282 m Patient Age:    89years       BP:           128/70 mmHg Patient Gender: M              HR:           83 bpm. [facility]  [facility]cedure: 2D Echo, Cardiac Doppler, Color Doppler and Intracardiac            Opacification Agent Indications:    CHF  History:  Patient has prior history of Echocardiogram examinations, most                 recent 12/28/2018. CHF and Cardiomyopathy, Morbid obesity,                 Arrythmias:Atrial Fibrillation, Signs/Symptoms:Dyspnea; Risk                 Factors:Hypertension and Diabetes. OSA, CKD.  Sonographer:    Dustin Flock RDCS Referring Phys: [place]  1. Patient has evidence for significant right sided heart failure.  2. Left ventricular ejection fraction, by estimation, is 55 to 60%. The left ventricle has normal function. The left ventricle has no regional wall motion abnormalities. Left ventricular diastolic parameters were normal.  3. Right ventricular systolic function is severely reduced. The right ventricular size is severely enlarged. There is severely elevated pulmonary artery systolic pressure.  4. Left atrial size was moderately dilated.  5. The mitral valve is normal in structure. Trivial mitral valve regurgitation. No evidence of mitral stenosis.  6. The aortic valve is normal in structure. Aortic valve regurgitation is not visualized. Mild to moderate aortic valve sclerosis/calcification is present, without any evidence of aortic stenosis.  7. The inferior vena cava is dilated in size with <50% respiratory variability, suggesting right atrial pressure of 15 mmHg. FINDINGS  Left Ventricle: Left ventricular ejection  fraction, by estimation, is 55 to 60%. The left ventricle has normal function. The left ventricle has no regional wall motion abnormalities. Definity contrast agent was given IV to delineate the left ventricular  endocardial borders. The left ventricular internal cavity size was normal in size. There is no left ventricular hypertrophy. Left ventricular diastolic parameters were normal. Right Ventricle: The right ventricular size is severely enlarged. Right vetricular wall thickness was not assessed. Right ventricular systolic function is severely reduced. There is severely elevated pulmonary artery systolic pressure. The tricuspid regurgitant velocity is 4.13 m/s, and with an assumed right atrial pressure of 8 mmHg, the estimated right ventricular systolic pressure is 16.1 mmHg. Left Atrium: Left atrial size was moderately dilated. Right Atrium: Right atrial size was normal in size. Pericardium: There is no evidence of pericardial effusion. Mitral Valve: The mitral valve is normal in structure. There is mild thickening of the mitral valve leaflet(s). There is mild calcification of the mitral valve leaflet(s). Normal mobility of the mitral valve leaflets. Moderate mitral annular calcification. Trivial mitral valve regurgitation. No evidence of mitral valve stenosis. Tricuspid Valve: The tricuspid valve is normal in structure. Tricuspid valve regurgitation is mild . No evidence of tricuspid stenosis. Aortic Valve: The aortic valve is normal in structure. Aortic valve regurgitation is not visualized. Mild to moderate aortic valve sclerosis/calcification is present, without any evidence of aortic stenosis. Pulmonic Valve: The pulmonic valve was normal in structure. Pulmonic valve regurgitation is mild. No evidence of pulmonic stenosis. Aorta: The aortic root is normal in size and structure. Venous: The inferior vena cava is dilated in size with less than 50% respiratory variability, suggesting right atrial pressure of  15 mmHg. IAS/Shunts: No atrial level shunt detected by color flow Doppler. Additional Comments: Patient has evidence for significant right sided heart failure.  LEFT VENTRICLE PLAX 2D LVIDd:         5.14 cm  Diastology LVIDs:         3.58 cm  LV e' lateral:   3.75 cm/s LV PW:         1.18 cm  LV  E/e' lateral: 26.7 LV IVS:        1.16 cm  LV e' medial:    4.65 cm/s LVOT diam:     2.00 cm  LV E/e' medial:  21.5 LV SV:         66 LV SV Index:   29 LVOT Area:     3.14 cm  RIGHT VENTRICLE RV Basal diam:  3.17 cm TAPSE (M-mode): 2.2 cm LEFT ATRIUM             Index LA diam:        5.10 cm 2.24 cm/m LA Vol (A2C):   78.4 ml 34.36 ml/m LA Vol (A4C):   87.7 ml 38.44 ml/m LA Biplane Vol: 87.8 ml 38.48 ml/m  AORTIC VALVE LVOT Vmax:   101.00 cm/s LVOT Vmean:  72.500 cm/s LVOT VTI:    0.211 m  AORTA Ao Root diam: 2.80 cm MITRAL VALVE                TRICUSPID VALVE MV Area (PHT): 4.12 cm     TR Peak grad:   68.2 mmHg MV Decel Time: 184 msec     TR Vmax:        413.00 cm/s MV E velocity: 100.00 cm/s                             SHUNTS                             Systemic VTI:  0.21 m                             Systemic Diam: 2.00 cm Jenkins Rouge MD Electronically signed by Jenkins Rouge MD Signature Date/Time: 04/01/2019/2:27:05 PM    Final        Subjective: Relates his breathing is at baseline.  Discharge Exam: Vitals:   04/14/19 1926 04/15/19 0458  BP: (!) 110/59 128/72  Pulse: 88 84  Resp: 16 14  Temp: 98.5 F (36.9 C) 97.8 F (36.6 C)  SpO2: 96% 93%   Vitals:   04/14/19 0802 04/14/19 1926 04/15/19 0458 04/15/19 0500  BP: 132/72 (!) 110/59 128/72   Pulse: 70 88 84   Resp:  16 14   Temp:  98.5 F (36.9 C) 97.8 F (36.6 C)   TempSrc:  Oral Oral   SpO2:  96% 93%   Weight:    119 kg  Height:        General: Pt is alert, awake, not in acute distress Cardiovascular: RRR, S1/S2 +, no rubs, no gallops Respiratory: CTA bilaterally, no wheezing, no rhonchi Abdominal: Soft, NT, ND, bowel sounds  + Extremities: no edema, no cyanosis    The results of significant diagnostics from this hospitalization (including imaging, microbiology, ancillary and laboratory) are listed below for reference.     Microbiology: No results found for this or any previous visit (from the past 240 hour(s)).   Labs: BNP (last 3 results) Recent Labs    12/25/18 1902 03/28/19 0912  BNP 291.0* 818.2*   Basic Metabolic Panel: Recent Labs  Lab 04/11/19 0423 04/11/19 1205 04/11/19 1206 04/12/19 0440 04/13/19 0430 04/14/19 0419 04/15/19 0553  NA 144   < > 144 142 145 144 142  K 4.1   < > 4.2 4.1 3.9 4.2 4.5  CL 99  --   --  96* 97* 98 100  CO2 36*  --   --  36* 37* 37* 31  GLUCOSE 70  --   --  120* 78 98 99  BUN 74*  --   --  62* 52* 51* 61*  CREATININE 2.03*  --   --  1.84* 1.65* 1.79* 1.74*  CALCIUM 8.9  --   --  9.0 9.1 9.2 9.1  MG 2.4  --   --  2.2 2.1 2.1 2.0   < > = values in this interval not displayed.   Liver Function Tests: No results for input(s): AST, ALT, ALKPHOS, BILITOT, PROT, ALBUMIN in the last 168 hours. No results for input(s): LIPASE, AMYLASE in the last 168 hours. No results for input(s): AMMONIA in the last 168 hours. CBC: Recent Labs  Lab 04/11/19 0423 04/11/19 1205 04/11/19 1206 04/12/19 0440 04/13/19 0430 04/14/19 0419 04/15/19 0553  WBC 3.8*  --   --  4.7 4.3 4.5 4.6  HGB 9.5*   < > 11.6* 9.3* 9.0* 9.6* 9.2*  HCT 34.8*   < > 34.0* 34.8* 33.4* 35.8* 34.6*  MCV 90.9  --   --  90.6 89.8 91.6 91.3  PLT 132*  --   --  123* 117* 132* 110*   < > = values in this interval not displayed.   Cardiac Enzymes: No results for input(s): CKTOTAL, CKMB, CKMBINDEX, TROPONINI in the last 168 hours. BNP: Invalid input(s): POCBNP CBG: Recent Labs  Lab 04/14/19 0602 04/14/19 1207 04/14/19 1641 04/14/19 2043 04/15/19 0624  GLUCAP 97 118* 116* 148* 87   D-Dimer No results for input(s): DDIMER in the last 72 hours. Hgb A1c No results for input(s): HGBA1C in the  last 72 hours. Lipid Profile No results for input(s): CHOL, HDL, LDLCALC, TRIG, CHOLHDL, LDLDIRECT in the last 72 hours. Thyroid function studies No results for input(s): TSH, T4TOTAL, T3FREE, THYROIDAB in the last 72 hours.  Invalid input(s): FREET3 Anemia work up Recent Labs    04/13/19 0430 04/14/19 0419  FERRITIN 247 344*   Urinalysis    Component Value Date/Time   COLORURINE YELLOW 03/28/2019 1020   APPEARANCEUR CLEAR 03/28/2019 1020   LABSPEC 1.010 03/28/2019 1020   PHURINE 5.0 03/28/2019 1020   GLUCOSEU NEGATIVE 03/28/2019 1020   HGBUR SMALL (A) 03/28/2019 1020   BILIRUBINUR NEGATIVE 03/28/2019 1020   KETONESUR NEGATIVE 03/28/2019 1020   PROTEINUR 30 (A) 03/28/2019 1020   UROBILINOGEN 0.2 05/16/2014 0822   NITRITE NEGATIVE 03/28/2019 1020   LEUKOCYTESUR NEGATIVE 03/28/2019 1020   Sepsis Labs Invalid input(s): PROCALCITONIN,  WBC,  LACTICIDVEN Microbiology No results found for this or any previous visit (from the past 240 hour(s)).   Time coordinating discharge: Over 30 minutes  SIGNED:   Charlynne Cousins, MD  [facility] 04/15/2019, 11:08 AM Pager   If 7PM-7AM, please contact night-coverage [email or web address] Password [email or web address]

## 2019-04-16 ENCOUNTER — Telehealth: Payer: Self-pay | Admitting: *Deleted

## 2019-04-16 NOTE — Telephone Encounter (Signed)
TRANSITIONAL CARE MANAGEMENT TELEPHONE OUTREACH NOTE   Contact Date: 04/16/2019 Contacted By: Zannie Cove, LPN    DISCHARGE INFORMATION Date of Discharge:04/15/19 Discharge Facility: [facility] Discharge Diagnosis:Acute systolic / congestive heart failure    Outpatient Follow Up Recommendations (copied from discharge summary) Recommendations for Outpatient Follow-up:  1. Follow up with heart failure team in 1-2 weeks Follow-up with the Coumadin clinic in 1 week  William Flores is a male primary care patient of [name], [name], MD. An outgoing telephone call was made today and I spoke with William Flores ( daughter).  William Flores condition(s) and treatment(s) were discussed. An opportunity to ask questions was provided and all were answered or forwarded as appropriate.    ACTIVITIES OF DAILY LIVING  William Flores lives with their family and he can perform ADLs semi- independently. his primary caregiver is William Flores, his daughter currently. he is able to depend on his primary caregiver(s) for consistent help. Transportation to appointments, to pick up medications, and to run errands is not a problem.  (Consider referral to [facility] if transportation or a consistent caregiver is a problem)   Fall Risk Fall Risk  03/08/2019 02/06/2019  Falls in the past year? 0 0  Number falls in past yr: - -    low [place] Modifications/Assistive Devices Wheelchair: No Cane: No Ramp: No Bedside Toilet: No Hospital Bed:  No Other: walker -does have   [place] he is not receiving home health n/a services.     MEDICATION RECONCILIATION  William Flores has been able to pick-up all prescribed discharge medications from the pharmacy.   A post discharge medication reconciliation was performed and the complete medication list was reviewed with the patient/caregiver and is current as of 04/16/2019. Changes highlighted below.  Discontinued Medications STOP taking these  medications   Dilt-XR 180 MG 24 hr capsule Generic drug: diltiazem   furosemide 80 MG tablet Commonly known as: LASIX   losartan 100 MG tablet Commonly known as: COZAAR   metFORMIN 500 MG tablet Commonly known as: GLUCOPHAGE   metoprolol tartrate 50 MG tablet Commonly known as: LOPRESSOR   pantoprazole 40 MG tablet Commonly known as: PROTONIX     Current Medication List Allergies as of 04/16/2019      Reactions   Codeine    Headache      Medication List       Accurate as of April 16, 2019  8:28 AM. If you have any questions, ask your nurse or doctor.        acetaminophen 325 MG tablet Commonly known as: TYLENOL Take 2 tablets (650 mg total) by mouth every 6 (six) hours as needed for mild pain (or Fever >/= 101).   allopurinol 300 MG tablet Commonly known as: ZYLOPRIM TAKE 1 TABLET BY MOUTH EVERY DAY   atorvastatin 20 MG tablet Commonly known as: LIPITOR 1 tablet daily   azelastine 0.05 % ophthalmic solution Commonly known as: OPTIVAR PLACE 2 DROPS INTO BOTH EYES 2 (TWO) TIMES DAILY. What changed: See the new instructions.   benzonatate 100 MG capsule Commonly known as: Tessalon Perles Take 1 capsule (100 mg total) by mouth 2 (two) times daily. What changed:   when to take this  reasons to take this   Farxiga 5 MG Tabs tablet Generic drug: dapagliflozin propanediol Take 5 mg by mouth daily.   ferrous sulfate 325 (65 FE) MG tablet Take 325 mg by mouth daily with breakfast.  Insulin Pen Needle 32G X 4 MM Misc Use to give insulin daily Dx E11.29   metoprolol succinate 50 MG 24 hr tablet Commonly known as: TOPROL-XL Take 1 tablet (50 mg total) by mouth daily. Take with or immediately following a meal.   NovoLOG FlexPen 100 UNIT/ML FlexPen Generic drug: insulin aspart insulin aspart (novoLOG) injection 0-9 Units 0-9 Units, Subcutaneous, 3 times daily with meals, CBG < 70: Implement Hypoglycemia Standing Orders and refer to Hypoglycemia  Standing Orders sidebar report CBG 70 - 120: 0 units CBG 121 - 150: 1 unit CBG 151 - 200: 2 units CBG 201 - 250: 3 units CBG 251 - 300: 5 units CBG 301 - 350: 7 units CBG 351 - 400: 9 units CBG > 400 What changed:   how much to take  how to take this  when to take this   polyethylene glycol 17 g packet Commonly known as: MIRALAX / GLYCOLAX Take 17 g by mouth daily. What changed:   when to take this  reasons to take this   polyvinyl alcohol 1.4 % ophthalmic solution Commonly known as: LIQUIFILM TEARS Place 1 drop into both eyes as needed for dry eyes.   potassium chloride SA 20 MEQ tablet Commonly known as: Klor-Con M20 Take 1 tablet (20 mEq total) by mouth daily.   sildenafil 20 MG tablet Commonly known as: REVATIO Take 1 tablet (20 mg total) by mouth 3 (three) times daily.   torsemide 20 MG tablet Commonly known as: DEMADEX Take 2 tablets (40 mg total) by mouth 2 (two) times daily.   Tyler Aas FlexTouch 100 UNIT/ML FlexTouch Pen Generic drug: insulin degludec Inject 0.18 mLs (18 Units total) into the skin at bedtime.   Trulicity 1.5 IF/5.3PH Sopn Generic drug: Dulaglutide INJECT 1.5 MG INTO THE SKIN ONCE A WEEK.   warfarin 5 MG tablet Commonly known as: COUMADIN Take as directed by the anticoagulation clinic. If you are unsure how to take this medication, talk to your nurse or doctor. Original instructions: Take 1 tablet (5 mg total) by mouth every evening. What changed: additional instructions        PATIENT EDUCATION & FOLLOW-UP PLAN  An appointment for Transitional Care Management is scheduled with [name], [name], MD on 04/26/19 at 855am.  Take all medications as prescribed  Contact our office by calling [phone] if you have any questions or concerns

## 2019-04-16 NOTE — Telephone Encounter (Signed)
TOC call made - spoke with daughter/ caregiver.

## 2019-04-23 ENCOUNTER — Encounter (HOSPITAL_COMMUNITY): Payer: Medicare Other

## 2019-04-25 ENCOUNTER — Other Ambulatory Visit: Payer: Self-pay | Admitting: Family Medicine

## 2019-04-26 ENCOUNTER — Encounter: Payer: Self-pay | Admitting: Family Medicine

## 2019-04-26 ENCOUNTER — Other Ambulatory Visit: Payer: Self-pay

## 2019-04-26 ENCOUNTER — Ambulatory Visit (INDEPENDENT_AMBULATORY_CARE_PROVIDER_SITE_OTHER): Payer: Medicare Other | Admitting: Family Medicine

## 2019-04-26 VITALS — BP 139/77 | HR 80 | Temp 97.6°F | Ht 64.0 in | Wt 271.4 lb

## 2019-04-26 DIAGNOSIS — Z7901 Long term (current) use of anticoagulants: Secondary | ICD-10-CM

## 2019-04-26 DIAGNOSIS — N183 Chronic kidney disease, stage 3 unspecified: Secondary | ICD-10-CM | POA: Diagnosis not present

## 2019-04-26 DIAGNOSIS — N1832 Chronic kidney disease, stage 3b: Secondary | ICD-10-CM

## 2019-04-26 DIAGNOSIS — I50813 Acute on chronic right heart failure: Secondary | ICD-10-CM

## 2019-04-26 DIAGNOSIS — E1122 Type 2 diabetes mellitus with diabetic chronic kidney disease: Secondary | ICD-10-CM

## 2019-04-26 LAB — CBC WITH DIFFERENTIAL/PLATELET
Basophils Absolute: 0.1 10*3/uL (ref 0.0–0.2)
Basos: 1 %
EOS (ABSOLUTE): 0.1 10*3/uL (ref 0.0–0.4)
Eos: 2 %
Hematocrit: 36.2 % — ABNORMAL LOW (ref 37.5–51.0)
Hemoglobin: 10.7 g/dL — ABNORMAL LOW (ref 13.0–17.7)
Immature Grans (Abs): 0 10*3/uL (ref 0.0–0.1)
Immature Granulocytes: 0 %
Lymphocytes Absolute: 0.5 10*3/uL — ABNORMAL LOW (ref 0.7–3.1)
Lymphs: 11 %
MCH: 26.6 pg (ref 26.6–33.0)
MCHC: 29.6 g/dL — ABNORMAL LOW (ref 31.5–35.7)
MCV: 90 fL (ref 79–97)
Monocytes Absolute: 0.5 10*3/uL (ref 0.1–0.9)
Monocytes: 10 %
Neutrophils Absolute: 3.4 10*3/uL (ref 1.4–7.0)
Neutrophils: 76 %
Platelets: 151 10*3/uL (ref 150–450)
RBC: 4.03 x10E6/uL — ABNORMAL LOW (ref 4.14–5.80)
RDW: 21.5 % — ABNORMAL HIGH (ref 11.6–15.4)
WBC: 4.5 10*3/uL (ref 3.4–10.8)

## 2019-04-26 LAB — CMP14+EGFR
ALT: 15 IU/L (ref 0–44)
AST: 20 IU/L (ref 0–40)
Albumin/Globulin Ratio: 1.3 (ref 1.2–2.2)
Albumin: 3.6 g/dL — ABNORMAL LOW (ref 3.8–4.8)
Alkaline Phosphatase: 91 IU/L (ref 39–117)
BUN/Creatinine Ratio: 23 (ref 10–24)
BUN: 35 mg/dL — ABNORMAL HIGH (ref 8–27)
Bilirubin Total: 0.6 mg/dL (ref 0.0–1.2)
CO2: 23 mmol/L (ref 20–29)
Calcium: 9.2 mg/dL (ref 8.6–10.2)
Chloride: 106 mmol/L (ref 96–106)
Creatinine, Ser: 1.54 mg/dL — ABNORMAL HIGH (ref 0.76–1.27)
GFR calc Af Amer: 53 mL/min/{1.73_m2} — ABNORMAL LOW (ref >59)
GFR calc non Af Amer: 46 mL/min/{1.73_m2} — ABNORMAL LOW (ref >59)
Globulin, Total: 2.7 g/dL (ref 1.5–4.5)
Glucose: 142 mg/dL — ABNORMAL HIGH (ref 65–99)
Potassium: 4.2 mmol/L (ref 3.5–5.2)
Sodium: 144 mmol/L (ref 134–144)
Total Protein: 6.3 g/dL (ref 6.0–8.5)

## 2019-04-26 LAB — COAGUCHEK XS/INR WAIVED
INR: 1.7 — ABNORMAL HIGH (ref 0.9–1.1)
Prothrombin Time: 20.6 s

## 2019-04-26 MED ORDER — BLOOD GLUCOSE TEST VI STRP: 1.0000 | ORAL_STRIP | Freq: Four times a day (QID) | 3 refills | Status: DC

## 2019-04-26 NOTE — Patient Instructions (Signed)
Instructed patient to increase his torsemide, if he is only taking 1 twice daily then increase it to the 2 twice daily that was recommended by cardiology on discharge, if he is already taking 2 twice daily then increase it to 3 in the morning and 2 in the evening for 3 days until he sees cardiology on Monday 3 days from now.  Recommended to take daily weights and avoid salt intake.

## 2019-04-26 NOTE — Progress Notes (Signed)
BP 139/77   Pulse 80   Temp 97.6 F (36.4 C) (Temporal)   Ht 5' 4"  (1.626 m)   Wt 271 lb 6 oz (123.1 kg)   SpO2 99% Comment: on 2 L oxygen via [place]  BMI 46.58 kg/m    Subjective:   Patient ID: William Flores, male    DOB: [birth year], 69 y.o.   MRN: [id number]  HPI: William Flores is a 69 y.o. male presenting on 04/26/2019 for Transitions Of Care   HPI TRANSITIONAL CARE MANAGEMENT TELEPHONE OUTREACH NOTE   Contact Date: 04/16/2019 Contacted By: Zannie Cove, LPN             DISCHARGE INFORMATION Date of Discharge:04/15/19 Discharge Facility: [facility] Discharge Diagnosis:Acute systolic / congestive heart failure  Patient is coming in today for recheck on heart failure, when he left the hospital his weight was 163 and here in the office today is 171 so he is up.  He thinks he has been taking the torsemide only 1 in the morning and 1 in the evening and it was prescribed on discharge as 2 in the morning and 2 in the evening so we will increase it back to the dose that he had previously.  He is starting to get a little bit of a cough like he was when he was in the congestive heart failure but is not complaining of any shortness of breath.  He is on 2 L oxygen currently and started uses CPAP machine at home again.  Patient also has chronic renal failure, they had increased his oral diuretics in the hospital to try and help prevent him from going back.  He was not on the CPAP prior to the hospitalization but is back using it now and is also back using his oxygen.  He is feeling weaker in his legs again like he was previous to the hospitalization and is starting to feel like he has more fluid in his abdomen again as well.  Coumadin recheck Target goal: 2.0-3.0 Reason on anticoagulation: Chronic A. fib Patient denies any bruising or bleeding or chest pain or palpitations   Relevant past medical, surgical, family and social history reviewed and updated as indicated. Interim  medical history since our last visit reviewed. Allergies and medications reviewed and updated.  Review of Systems  Constitutional: Negative for chills and fever.  Respiratory: Positive for cough. Negative for shortness of breath and wheezing.   Cardiovascular: Positive for leg swelling (1+ pitting edema bilaterally lower extremities). Negative for chest pain.  Gastrointestinal: Positive for abdominal distention. Negative for abdominal pain.  Musculoskeletal: Negative for back pain and gait problem.  Skin: Negative for rash.  All other systems reviewed and are negative.   Per HPI unless specifically indicated above   Allergies as of 04/26/2019      Reactions   Codeine    Headache      Medication List       Accurate as of April 26, 2019  9:46 AM. If you have any questions, ask your nurse or doctor.        acetaminophen 325 MG tablet Commonly known as: TYLENOL Take 2 tablets (650 mg total) by mouth every 6 (six) hours as needed for mild pain (or Fever >/= 101).   allopurinol 300 MG tablet Commonly known as: ZYLOPRIM TAKE 1 TABLET BY MOUTH EVERY DAY   atorvastatin 20 MG tablet Commonly known as: LIPITOR 1 tablet daily   azelastine 0.05 % ophthalmic solution  Commonly known as: OPTIVAR PLACE 2 DROPS INTO BOTH EYES 2 (TWO) TIMES DAILY. What changed: See the new instructions.   benzonatate 100 MG capsule Commonly known as: Tessalon Perles Take 1 capsule (100 mg total) by mouth 2 (two) times daily. What changed:   when to take this  reasons to take this   BLOOD GLUCOSE TEST STRIPS Strp 1 strip by In Vitro route 4 (four) times daily. Started by: Worthy Rancher, MD   Farxiga 5 MG Tabs tablet Generic drug: dapagliflozin propanediol Take 5 mg by mouth daily.   ferrous sulfate 325 (65 FE) MG tablet Take 325 mg by mouth daily with breakfast.   Insulin Pen Needle 32G X 4 MM Misc Use to give insulin daily Dx E11.29   metoprolol succinate 50 MG 24 hr  tablet Commonly known as: TOPROL-XL Take 1 tablet (50 mg total) by mouth daily. Take with or immediately following a meal.   NovoLOG FlexPen 100 UNIT/ML FlexPen Generic drug: insulin aspart insulin aspart (novoLOG) injection 0-9 Units 0-9 Units, Subcutaneous, 3 times daily with meals, CBG < 70: Implement Hypoglycemia Standing Orders and refer to Hypoglycemia Standing Orders sidebar report CBG 70 - 120: 0 units CBG 121 - 150: 1 unit CBG 151 - 200: 2 units CBG 201 - 250: 3 units CBG 251 - 300: 5 units CBG 301 - 350: 7 units CBG 351 - 400: 9 units CBG > 400 What changed:   how much to take  how to take this  when to take this   polyethylene glycol 17 g packet Commonly known as: MIRALAX / GLYCOLAX Take 17 g by mouth daily. What changed:   when to take this  reasons to take this   polyvinyl alcohol 1.4 % ophthalmic solution Commonly known as: LIQUIFILM TEARS Place 1 drop into both eyes as needed for dry eyes.   potassium chloride SA 20 MEQ tablet Commonly known as: Klor-Con M20 Take 1 tablet (20 mEq total) by mouth daily.   sildenafil 20 MG tablet Commonly known as: REVATIO Take 1 tablet (20 mg total) by mouth 3 (three) times daily.   torsemide 20 MG tablet Commonly known as: DEMADEX Take 2 tablets (40 mg total) by mouth 2 (two) times daily.   Tyler Aas FlexTouch 100 UNIT/ML FlexTouch Pen Generic drug: insulin degludec Inject 0.18 mLs (18 Units total) into the skin at bedtime.   Trulicity 1.5 HL/4.5GY Sopn Generic drug: Dulaglutide INJECT 1.5 MG INTO THE SKIN ONCE A WEEK.   warfarin 5 MG tablet Commonly known as: COUMADIN Take as directed by the anticoagulation clinic. If you are unsure how to take this medication, talk to your nurse or doctor. Original instructions: Take 1 tablet (5 mg total) by mouth every evening. What changed: additional instructions        Objective:   BP 139/77   Pulse 80   Temp 97.6 F (36.4 C) (Temporal)   Ht 5' 4"  (1.626 m)   Wt 271  lb 6 oz (123.1 kg)   SpO2 99% Comment: on 2 L oxygen via [place]  BMI 46.58 kg/m   Wt Readings from Last 3 Encounters:  04/26/19 271 lb 6 oz (123.1 kg)  04/15/19 262 lb 6.4 oz (119 kg)  03/08/19 243 lb (110.2 kg)    Physical Exam Vitals and nursing note reviewed.  Constitutional:      General: He is not in acute distress.    Appearance: He is well-developed. He is not diaphoretic.  Eyes:  General: No scleral icterus.    Conjunctiva/sclera: Conjunctivae normal.  Neck:     Thyroid: No thyromegaly.  Cardiovascular:     Rate and Rhythm: Normal rate and regular rhythm.     Heart sounds: Normal heart sounds. No murmur.  Pulmonary:     Effort: Pulmonary effort is normal. No respiratory distress.     Breath sounds: Normal breath sounds. No wheezing.  Abdominal:     General: Abdomen is flat. Bowel sounds are normal. There is distension.     Tenderness: There is no abdominal tenderness. There is no right CVA tenderness, left CVA tenderness, guarding or rebound.  Musculoskeletal:        General: Swelling (1+ pitting edema bilaterally) present. Normal range of motion.     Cervical back: Neck supple.  Lymphadenopathy:     Cervical: No cervical adenopathy.  Skin:    General: Skin is warm and dry.     Findings: No rash.  Neurological:     Mental Status: He is alert and oriented to person, place, and time.     Coordination: Coordination normal.  Psychiatric:        Behavior: Behavior normal.       Assessment & Plan:   Problem List Items Addressed This Visit      Cardiovascular and Mediastinum   Right heart failure ([place])   Relevant Orders   CMP14+EGFR   CBC with Differential/Platelet     Endocrine   CKD stage 3 due to type 2 diabetes mellitus ([place])   Relevant Orders   CMP14+EGFR   CBC with Differential/Platelet     Genitourinary   Chronic renal failure   Relevant Orders   CMP14+EGFR   CBC with Differential/Platelet     Other   Anticoagulated on Coumadin - Primary  (Chronic)   Relevant Orders   CoaguChek XS/INR Waived   CMP14+EGFR   CBC with Differential/Platelet      Instructed patient to increase his torsemide, if he is only taking 1 twice daily then increase it to the 2 twice daily that was recommended by cardiology on discharge, if he is already taking 2 twice daily then increase it to 3 in the morning and 2 in the evening for 3 days until he sees cardiology on Monday 3 days from now.  Recommended to take daily weights and avoid salt intake. Follow up plan: Return in about 4 weeks (around 05/24/2019), or if symptoms worsen or fail to improve, for Recheck diabetes and CKD and CHF.  Counseling provided for all of the vaccine components Orders Placed This Encounter  Procedures  . CoaguChek XS/INR Waived  . CMP14+EGFR  . CBC with Differential/Platelet    Caryl Pina, MD [facility] 04/26/2019, 9:46 AM

## 2019-04-29 ENCOUNTER — Encounter (HOSPITAL_COMMUNITY): Payer: Self-pay

## 2019-04-29 ENCOUNTER — Other Ambulatory Visit: Payer: Self-pay

## 2019-04-29 ENCOUNTER — Ambulatory Visit (HOSPITAL_COMMUNITY)
Admit: 2019-04-29 | Discharge: 2019-04-29 | Disposition: A | Discharge status: Home / Self Care | Payer: Medicare Other | Source: Ambulatory Visit | Attending: Adult Health | Admitting: Adult Health

## 2019-04-29 VITALS — BP 138/86 | HR 78 | Wt 260.5 lb

## 2019-04-29 DIAGNOSIS — Z794 Long term (current) use of insulin: Secondary | ICD-10-CM | POA: Insufficient documentation

## 2019-04-29 DIAGNOSIS — Z85038 Personal history of other malignant neoplasm of large intestine: Secondary | ICD-10-CM | POA: Diagnosis not present

## 2019-04-29 DIAGNOSIS — Z9981 Dependence on supplemental oxygen: Secondary | ICD-10-CM | POA: Insufficient documentation

## 2019-04-29 DIAGNOSIS — I130 Hypertensive heart and chronic kidney disease with heart failure and stage 1 through stage 4 chronic kidney disease, or unspecified chronic kidney disease: Secondary | ICD-10-CM | POA: Insufficient documentation

## 2019-04-29 DIAGNOSIS — Z7901 Long term (current) use of anticoagulants: Secondary | ICD-10-CM | POA: Diagnosis not present

## 2019-04-29 DIAGNOSIS — Z803 Family history of malignant neoplasm of breast: Secondary | ICD-10-CM | POA: Diagnosis not present

## 2019-04-29 DIAGNOSIS — Z6841 Body Mass Index (BMI) 40.0 and over, adult: Secondary | ICD-10-CM | POA: Diagnosis not present

## 2019-04-29 DIAGNOSIS — I272 Pulmonary hypertension, unspecified: Secondary | ICD-10-CM

## 2019-04-29 DIAGNOSIS — E662 Morbid (severe) obesity with alveolar hypoventilation: Secondary | ICD-10-CM | POA: Diagnosis not present

## 2019-04-29 DIAGNOSIS — Z86010 Personal history of colonic polyps: Secondary | ICD-10-CM | POA: Diagnosis not present

## 2019-04-29 DIAGNOSIS — Z885 Allergy status to narcotic agent status: Secondary | ICD-10-CM | POA: Diagnosis not present

## 2019-04-29 DIAGNOSIS — I429 Cardiomyopathy, unspecified: Secondary | ICD-10-CM | POA: Diagnosis not present

## 2019-04-29 DIAGNOSIS — I2781 Cor pulmonale (chronic): Secondary | ICD-10-CM | POA: Insufficient documentation

## 2019-04-29 DIAGNOSIS — Z87891 Personal history of nicotine dependence: Secondary | ICD-10-CM | POA: Diagnosis not present

## 2019-04-29 DIAGNOSIS — I482 Chronic atrial fibrillation, unspecified: Secondary | ICD-10-CM | POA: Diagnosis not present

## 2019-04-29 DIAGNOSIS — Z833 Family history of diabetes mellitus: Secondary | ICD-10-CM | POA: Diagnosis not present

## 2019-04-29 DIAGNOSIS — I2510 Atherosclerotic heart disease of native coronary artery without angina pectoris: Secondary | ICD-10-CM | POA: Diagnosis not present

## 2019-04-29 DIAGNOSIS — Z8249 Family history of ischemic heart disease and other diseases of the circulatory system: Secondary | ICD-10-CM | POA: Diagnosis not present

## 2019-04-29 DIAGNOSIS — E785 Hyperlipidemia, unspecified: Secondary | ICD-10-CM | POA: Insufficient documentation

## 2019-04-29 DIAGNOSIS — I50812 Chronic right heart failure: Secondary | ICD-10-CM

## 2019-04-29 DIAGNOSIS — D472 Monoclonal gammopathy: Secondary | ICD-10-CM | POA: Diagnosis not present

## 2019-04-29 DIAGNOSIS — I428 Other cardiomyopathies: Secondary | ICD-10-CM

## 2019-04-29 DIAGNOSIS — N183 Chronic kidney disease, stage 3 unspecified: Secondary | ICD-10-CM | POA: Insufficient documentation

## 2019-04-29 DIAGNOSIS — E1122 Type 2 diabetes mellitus with diabetic chronic kidney disease: Secondary | ICD-10-CM | POA: Diagnosis not present

## 2019-04-29 DIAGNOSIS — I5032 Chronic diastolic (congestive) heart failure: Secondary | ICD-10-CM | POA: Diagnosis not present

## 2019-04-29 DIAGNOSIS — Z79899 Other long term (current) drug therapy: Secondary | ICD-10-CM | POA: Insufficient documentation

## 2019-04-29 NOTE — Patient Instructions (Signed)
You have been ordered a PYP Scan.  This is done in the Radiology Department of [facility]  When you come for this test please plan to be there 2-3 hours.  You have been referred to Hematology  Your physician recommends that you schedule a follow-up appointment in: 6 weeks with Dr Aundra Dubin  If you have any questions or concerns before your next appointment please send [place] a message through [facility] or call our office at [phone].  At the [facility], you and your health needs are our priority. As part of our continuing mission to provide you with exceptional heart care, we have created designated Provider Care Teams. These Care Teams include your primary Cardiologist (physician) and Advanced Practice Providers (APPs- Physician Assistants and Nurse Practitioners) who all work together to provide you with the care you need, when you need it.   You may see any of the following providers on your designated Care Team at your next follow up: Marland Kitchen Dr Glori Bickers . Dr Loralie Champagne . Darrick Grinder, NP . Lyda Jester, PA . Audry Riles, PharmD   Please be sure to bring in all your medications bottles to every appointment.

## 2019-04-29 NOTE — Progress Notes (Signed)
Advanced Heart Failure Clinic Note   Referring Physician: PCP: [name], [name], MD PCP-Cardiologist: Minus Breeding, MD   HPI:  69 y/o male with history of chronic atrial fibrillation, chronic anticoagulation therapy w/ coumadin, h/o systolic HF (felt to be tachymediated, w/ eventual recovery of LVEF),  chronic diastolic CHF, RV failure w/ pulmonary HTN, Diabetes, Obesity, OHS/OSA w/ poor compliance w/ CPAP, h/o AKI, h/o GIB, colon cancer treated w/ resection and h/o MUGUS. Previously followed in [facility] but lost to f/u and not seen since 2016.    Patient was admitted in 02/2014 with hypoxic respiratory failure with acute systolic CHF and possible PNA. He was in atrial fibrillation with RVR and developed AKI. Cardiomyopathy (EF 35-40% by echo in 2016) thought to be tachycardia-mediated at the time. He was admitted again in 4/16 with hypotension and AKI as well as multiple metabolic derangements. He required CVVH. Echo at that time showed EF 60-65%. He was again admitted in 5/16, again with hypoxic respiratory failure and was intubated. EF 60-65% by echo on this admission. He again had AKI and had HD x 1. Renal function again recovered. Also, per records, he had a LHC in 2008 that showed no significant CAD.   Had repeat echo 12/2018 which showed normal LVEF 50-55% w/ moderate LVH, normal LA size, mild MR, severely elevated pulmonary artery systolic pressure, 77 mmHg and moderately reduced right ventricular systolic function. Echo was obtained while admitted for acute anemia 2/2 acute GIB, in the setting of supratherpaeutic INR. INR was > 10.  Hgb 5.1. INR was reversed w/ Kcentra + Vit K. Treated w/ transfusion. Underwent GI w/u. EGD showed chronic gastritis but no clear source of bleeding. Biopsy negative for H Pylori. Colonoscopy showed several polyps, removed and biopsied. No high grade dysplasia. He was placed back on Coumadin. INRs followed by PCP.   Recently admitted to [facility] for a/c  CHF 3/21. Also w/ AKI on admit. SCr was 1.8 (baseline 1.3-1.5). He was started on IV lasix for diuresis, 60 mg bid. Diuresis sluggish and eventually changed to lasix gtt. SCr has worsened w/ attempts at diuresis, w/ SCr rising to 2.3. Echo repeated. LVEF 55-60%. RV severely enlarged w/ severely reduced RV systolic function. Pulmonary artery systolic pressure severely elevated at 76 mmhg. There is mild TR. Also mild to moderate aortic valve sclerosis/calcification is present, without any evidence of aortic stenosis. He was subsequently transferred to [facility] for further management of his HF w/ formal RHC and AHF consultation.  On arrival to [facility], He was placed on high dose IV Lasix w/ better urinary response/ diuresis. RHC 4/8 with elevated filling pressures, mixed pulmonary venous/pulmonary hypertension. V/Q scan negative for chronic PE. He was continued on IV diuretics and diuresed > 20 lb and placed on PO torsemide 40 mg bid.  SCr improved w/ diuresis. Also started on trial of sildenafil 20 mg tid. Advised to continue supp home O2 and to restart regular use of CPAP. Also of note, a multiple myeloma panel was done given MGUS and showed elevated M spike protein. Urine immunofixation and UPEP both unremarkable. Discharge wt was 261 lb.   He presents to clinic today for f/u. Here with his daughter who he is currently residing with. Feels great. Volume status stable since discharge. Wt 260 lb today. Reports full med compliance. Has some mild dyspnea w/ moderate activities but doing ok w/ ALDs. Denies resting dyspnea. No orthopnea/PND or LEE. He is back to using his CPAP reguarlly at night and has a  new device.      Review of Systems: [y] = yes, _0  = no   General: Weight gain _1 ; Weight loss _2 ; Anorexia _3 ; Fatigue _4 ; Fever _5 ; Chills _6 ; Weakness _7   Cardiac: Chest pain/pressure _8 ; Resting SOB _9 ; Exertional SOB _10 ; Orthopnea _11 ; Pedal Edema _12 ; Palpitations _13 ; Syncope _14 ; Presyncope _15 ;  Paroxysmal nocturnal dyspnea_16   Pulmonary: Cough _17 ; Wheezing_18 ; Hemoptysis_19 ; Sputum _20 ; Snoring _21   GI: Vomiting_22 ; Dysphagia_23 ; Melena_24 ; Hematochezia _25 ; Heartburn_26 ; Abdominal pain _27 ; Constipation _28 ; Diarrhea _29 ; BRBPR _30   GU: Hematuria_31 ; Dysuria _32 ; Nocturia_33   Vascular: Pain in legs with walking _34 ; Pain in feet with lying flat _35 ; Non-healing sores _36 ; Stroke _37 ; TIA _38 ; Slurred speech _39 ;  Neuro: Headaches_40 ; Vertigo_41 ; Seizures_42 ; Paresthesias_43 ;Blurred vision _44 ; Diplopia _45 ; Vision changes _46   Ortho/Skin: Arthritis _47 ; Joint pain _48 ; Muscle pain _49 ; Joint swelling _50 ; Back Pain _51 ; Rash _52   Psych: Depression_53 ; Anxiety_54   Heme: Bleeding problems _55 ; Clotting disorders _56 ; Anemia _57   Endocrine: Diabetes _58 ; Thyroid dysfunction_59    Past Medical History:  Diagnosis Date  . CAD (coronary artery disease)    LHC 2/08:  mRCA 50%, o/w no sig CAD, EF 25%  . Cardiomyopathy ([place])    EF 35-40% in 2/16 in setting of AF with RVR >> echo 5/16 with improved LVF with EF 65-70%  . Chronic atrial fibrillation ([place])   . Chronic diastolic heart failure ([facility])    HFPF - EF ~65-70% (OFTEN EXACERBATED BY AFIB)  . CKD (chronic kidney disease)    hx of worsening renal failure and hyperK+ in setting of acute diast CHF >> required CVVHD in 4/16 and dialysis x 1 in 5/16  . CKD stage 3 due to type 2 diabetes mellitus ([place]) 09/17/2015  . Colon cancer ([place])   . Colon polyps 09/21/2012   Tubular adenoma  . Diabetes ([place])   . History of echocardiogram    Echo 05/16/14:  mild LVH, vigorous LVF, EF 65-70%, no RWMA, mean AV gradient 9 mmHg, MAC, mild MR, mod LAE, mild RVE, mild reduced RVF, severe RAE  . Hyperlipidemia   . Hypertension   . Obesity hypoventilation syndrome ([place])   . Renal insufficiency   . Sleep apnea     Current Outpatient Medications  Medication Sig Dispense Refill  . acetaminophen (TYLENOL) 325 MG tablet Take 2 tablets (650 mg total) by  mouth every 6 (six) hours as needed for mild pain (or Fever >/= 101). 12 tablet 0  . allopurinol (ZYLOPRIM) 300 MG tablet TAKE 1 TABLET BY MOUTH EVERY DAY 90 tablet 0  . atorvastatin (LIPITOR) 20 MG tablet 1 tablet daily 90 tablet 3  . azelastine (OPTIVAR) 0.05 % ophthalmic solution PLACE 2 DROPS INTO BOTH EYES 2 (TWO) TIMES DAILY. (Patient taking differently: Place 2 drops into the right eye 2 (two) times daily as needed (dry eye from allergies). ) 6 mL 5  . benzonatate (TESSALON PERLES) 100 MG capsule Take 1 capsule (100 mg total) by mouth 2 (two) times daily. (Patient taking differently: Take 100 mg by mouth 2 (two) times daily as needed. ) 60 capsule 3  . FARXIGA 5 MG TABS tablet Take 5 mg by  mouth daily.    . ferrous sulfate 325 (65 FE) MG tablet Take 325 mg by mouth daily with breakfast.     . Glucose Blood (BLOOD GLUCOSE TEST STRIPS) STRP 1 strip by In Vitro route 4 (four) times daily. 120 strip 3  . insulin aspart (NOVOLOG FLEXPEN) 100 UNIT/ML FlexPen insulin aspart (novoLOG) injection 0-9 Units 0-9 Units, Subcutaneous, 3 times daily with meals, CBG < 70: Implement Hypoglycemia Standing Orders and refer to Hypoglycemia Standing Orders sidebar report CBG 70 - 120: 0 units CBG 121 - 150: 1 unit CBG 151 - 200: 2 units CBG 201 - 250: 3 units CBG 251 - 300: 5 units CBG 301 - 350: 7 units CBG 351 - 400: 9 units CBG > 400 (Patient taking differently: Inject 9 Units into the skin in the morning and at bedtime. insulin aspart (novoLOG) injection 0-9 Units 0-9 Units, Subcutaneous, 3 times daily with meals, CBG < 70: Implement Hypoglycemia Standing Orders and refer to Hypoglycemia Standing Orders sidebar report CBG 70 - 120: 0 units CBG 121 - 150: 1 unit CBG 151 - 200: 2 units CBG 201 - 250: 3 units CBG 251 - 300: 5 units CBG 301 - 350: 7 units CBG 351 - 400: 9 units CBG > 400) 15 mL 11  . insulin degludec (TRESIBA FLEXTOUCH) 100 UNIT/ML SOPN FlexTouch Pen Inject 0.18 mLs (18 Units total) into the skin at  bedtime. 15 pen 1  . Insulin Pen Needle 32G X 4 MM MISC Use to give insulin daily Dx E11.29 100 each 3  . metoprolol succinate (TOPROL-XL) 50 MG 24 hr tablet Take 1 tablet (50 mg total) by mouth daily. Take with or immediately following a meal. 30 tablet 3  . polyethylene glycol (MIRALAX / GLYCOLAX) 17 g packet Take 17 g by mouth daily. (Patient taking differently: Take 17 g by mouth daily as needed. ) 14 each 0  . polyvinyl alcohol (LIQUIFILM TEARS) 1.4 % ophthalmic solution Place 1 drop into both eyes as needed for dry eyes. 15 mL 0  . potassium chloride SA (KLOR-CON M20) 20 MEQ tablet Take 1 tablet (20 mEq total) by mouth daily. 30 tablet 3  . sildenafil (REVATIO) 20 MG tablet Take 1 tablet (20 mg total) by mouth 3 (three) times daily. 90 tablet 3  . torsemide (DEMADEX) 20 MG tablet Take 2 tablets (40 mg total) by mouth 2 (two) times daily. 30 tablet 3  . TRULICITY 1.5 ZO/1.0RU SOPN INJECT 1.5 MG INTO THE SKIN ONCE A WEEK. 6 mL 0  . warfarin (COUMADIN) 5 MG tablet Take 1 tablet (5 mg total) by mouth every evening. (Patient taking differently: Take 5 mg by mouth every evening. 12/2018: directions were "take as directed". 01/2019: directions were "take 1 tab po qd". Patient has been taking 1 tablet every other day, alternating 1.5 tabs on the other days.) 30 tablet 3   No current facility-administered medications for this encounter.    Allergies  Allergen Reactions  . Codeine     Headache       Social History   Socioeconomic History  . Marital status: Divorced    Spouse name: Not on file  . Number of children: 1  . Years of education: Not on file  . Highest education level: Not on file  Occupational History    Employer: [employer]  Tobacco Use  . Smoking status: Former Smoker    Quit date: 08/06/1983    Years since quitting: 35.7  . Smokeless  tobacco: Never Used  Substance and Sexual Activity  . Alcohol use: No  . Drug use: No  . Sexual activity: Not on file  Other  Topics Concern  . Not on file  Social History Narrative  . Not on file   Social Determinants of Health   Financial Resource Strain:   . Difficulty of Paying Living Expenses:   Food Insecurity:   . Worried About Charity fundraiser in the Last Year:   . Arboriculturist in the Last Year:   Transportation Needs:   . Film/video editor (Medical):   Marland Kitchen Lack of Transportation (Non-Medical):   Physical Activity:   . Days of Exercise per Week:   . Minutes of Exercise per Session:   Stress:   . Feeling of Stress :   Social Connections:   . Frequency of Communication with Friends and Family:   . Frequency of Social Gatherings with Friends and Family:   . Attends Religious Services:   . Active Member of Clubs or Organizations:   . Attends Archivist Meetings:   Marland Kitchen Marital Status:   Intimate Partner Violence:   . Fear of Current or Ex-Partner:   . Emotionally Abused:   Marland Kitchen Physically Abused:   . Sexually Abused:       Family History  Problem Relation Age of Onset  . Breast cancer Mother   . Diabetes Mother   . Heart attack Father     Vitals:   04/29/19 1329  BP: 138/86  Pulse: 78  SpO2: 100%  Weight: 118.2 kg     PHYSICAL EXAM: General:  Well appearing, moderately obese . No respiratory difficulty HEENT: normal Neck: supple. no JVD. Carotids 2+ bilat; no bruits. No lymphadenopathy or thyromegaly appreciated. Cor: PMI nondisplaced. Regular rate & rhythm. No rubs, gallops or murmurs. Lungs: clear Abdomen: soft, nontender, nondistended. No hepatosplenomegaly. No bruits or masses. Good bowel sounds. Extremities: no cyanosis, clubbing, rash, edema Neuro: alert & oriented x 3, cranial nerves grossly intact. moves all 4 extremities w/o difficulty. Affect pleasant.    ASSESSMENT & PLAN:  1. Cor pulmonale/RV failure: Echo with EF 55-60%, RV severely dilated w/ severe RV dysfunction in the setting of severe pulmonary hypertension w/ PASP 76 mmHg. RHC 4/8 with  elevated filling pressures, mixed pulmonary venous/pulmonary hypertension=> diuresed w/ IV Lasix in hospital and placed on PO torsemide.  - volume status stable since hospital d/c. Current wt c/w d/c wt at 260 lb - NYHA Class II  - Continue torsemide 40 mg po bid.  - He had f/u BMP done at PCP office 3 days ago. I have reviewed lab results and SCr and K both stable.  - We discussed continuation of daily wts, compliance w/ meds and low sodium diet/ fluid restriction. We also discussed sliding scale lasix dosing in the event of wt gain - Will check PYP scan to r/o TTR amyloid given h/o MGUS and recent multiple myeloma panel  w/ elevated M spike protein, concerning for progression to MM. Doubt AL amyloid given cardiac morphology on echo  2. Pulmonary HTN: PASP elevated at 76 mmHg by recent echo estimate. RV systolic function severely reduced. Most likely poorly controlled OHS/OSA as cause (group 3 PH).  V/Q scan negative for chronic PE.  Mixed PVH/PAH on RHC.   - Symptoms Improved w/ sildenafil, will continue 20 mg tid  - Continue home oxygen  - He has restarted CPAP and fully compliant. Encouraged to continue  3. Stage  III CKD - SCr stable on recent BMP (personally reviewed)  4. Chronic Atrial Fibrillation: Rate controlled.  - Continue Toprol XL 50 mg daily.   - Continue warfarin. INRs followed by PCP. Denies abnormal bleeding  5. OSA/OHS:  - reports improved compliance w/ CPAP. Using nightly and tolerating well. Advised to continue 6. Diabetes - Management per PCP  7. MGUS:  - previously followed by hem/onc but has not seen in several years - multiple myeloma panel 4/21 w/ elevated M spike protein, concerning for progression to MM. Will refer back to hem/onc for further w/u -  Check PYP scan to r/o TTR amyloid. Doubt AL amyloid given morphology on echo   F/u w/ Dr. Aundra Dubin in 6-8 weeks.   Lyda Jester, PA-C 04/29/19

## 2019-04-30 ENCOUNTER — Ambulatory Visit (INDEPENDENT_AMBULATORY_CARE_PROVIDER_SITE_OTHER): Payer: Medicare Other | Admitting: *Deleted

## 2019-04-30 ENCOUNTER — Other Ambulatory Visit: Payer: Self-pay | Admitting: *Deleted

## 2019-04-30 DIAGNOSIS — Z0000 Encounter for general adult medical examination without abnormal findings: Secondary | ICD-10-CM | POA: Diagnosis not present

## 2019-04-30 MED ORDER — BLOOD GLUCOSE TEST VI STRP: ORAL_STRIP | 3 refills | Status: DC

## 2019-04-30 NOTE — Progress Notes (Addendum)
MEDICARE ANNUAL WELLNESS VISIT  04/30/2019  Telephone Visit Disclaimer This Medicare AWV was conducted by telephone due to national recommendations for restrictions regarding the COVID-19 Pandemic (e.g. social distancing).  I verified, using two identifiers, that I am speaking with William Flores or their authorized healthcare agent. I discussed the limitations, risks, security, and privacy concerns of performing an evaluation and management service by telephone and the potential availability of an in-person appointment in the future. The patient expressed understanding and agreed to proceed.   Subjective:  William Flores is a 69 y.o. male patient of William Flores who had a Medicare Annual Wellness Visit today via telephone. William Flores is Retired and lives with their daughter. he has 1 child. he reports that he is socially active and does interact with friends/family regularly. he is not physically active and enjoys fishing.  Patient Care Team: William Flores as PCP - General (Family Medicine) William Flores as PCP - Cardiology (Cardiology) William Flores as Consulting Physician (Cardiology)  Advanced Directives 04/30/2019 03/28/2019 03/28/2019 12/25/2018 12/25/2018 05/10/2016 11/10/2015  Does Patient Have a Medical Advance Directive? _0  No No  Would patient like information on creating a medical advance directive? Yes (MAU/Ambulatory/Procedural Areas - Information given) No - Patient declined No - Patient declined No - Patient declined Yes (ED - Information included in AVS) No - Patient declined No - patient declined information  Pre-existing out of facility DNR order (yellow form or pink MOST form) - - - - - - -    Hospital Utilization Over the Past 12 Months: # of hospitalizations or ER visits: 1 # of surgeries: 0  Review of Systems    Patient reports that his overall health is worse compared to last year.  History obtained from chart review and  the patient General ROS: negative  Patient Reported Readings (BP, Pulse, CBG, Weight, etc) none  Pain Assessment Pain : No/denies pain Pain Score: 0-No pain     Current Medications & Allergies (verified) Allergies as of 04/30/2019      Reactions   Codeine    Headache      Medication List       Accurate as of April 30, 2019 11:05 AM. If you have any questions, ask your nurse or doctor.        acetaminophen 325 MG tablet Commonly known as: TYLENOL Take 2 tablets (650 mg total) by mouth every 6 (six) hours as needed for mild pain (or Fever >/= 101).   allopurinol 300 MG tablet Commonly known as: ZYLOPRIM TAKE 1 TABLET BY MOUTH EVERY DAY   atorvastatin 20 MG tablet Commonly known as: LIPITOR 1 tablet daily   azelastine 0.05 % ophthalmic solution Commonly known as: OPTIVAR PLACE 2 DROPS INTO BOTH EYES 2 (TWO) TIMES DAILY. What changed: See the new instructions.   benzonatate 100 MG capsule Commonly known as: Tessalon Perles Take 1 capsule (100 mg total) by mouth 2 (two) times daily. What changed:   when to take this  reasons to take this   BLOOD GLUCOSE TEST STRIPS Strp Test BS QID Dx E11.29 What changed:   how much to take  how to take this  when to take this  additional instructions Changed by: Lucienne Minks, LPN   Farxiga 5 MG Tabs tablet Generic drug: dapagliflozin propanediol Take 5 mg by mouth daily.   ferrous sulfate 325 (65 FE) MG tablet Take 325 mg by mouth  daily with breakfast.   Insulin Pen Needle 32G X 4 MM Misc Use to give insulin daily Dx E11.29   metoprolol succinate 50 MG 24 hr tablet Commonly known as: TOPROL-XL Take 1 tablet (50 mg total) by mouth daily. Take with or immediately following a meal.   NovoLOG FlexPen 100 UNIT/ML FlexPen Generic drug: insulin aspart insulin aspart (novoLOG) injection 0-9 Units 0-9 Units, Subcutaneous, 3 times daily with meals, CBG < 70: Implement Hypoglycemia Standing Orders and refer  to Hypoglycemia Standing Orders sidebar report CBG 70 - 120: 0 units CBG 121 - 150: 1 unit CBG 151 - 200: 2 units CBG 201 - 250: 3 units CBG 251 - 300: 5 units CBG 301 - 350: 7 units CBG 351 - 400: 9 units CBG > 400 What changed:   how much to take  how to take this  when to take this   polyethylene glycol 17 g packet Commonly known as: MIRALAX / GLYCOLAX Take 17 g by mouth daily. What changed:   when to take this  reasons to take this   polyvinyl alcohol 1.4 % ophthalmic solution Commonly known as: LIQUIFILM TEARS Place 1 drop into both eyes as needed for dry eyes.   potassium chloride SA 20 MEQ tablet Commonly known as: Klor-Con M20 Take 1 tablet (20 mEq total) by mouth daily.   sildenafil 20 MG tablet Commonly known as: REVATIO Take 1 tablet (20 mg total) by mouth 3 (three) times daily.   torsemide 20 MG tablet Commonly known as: DEMADEX Take 2 tablets (40 mg total) by mouth 2 (two) times daily.   Tyler Aas FlexTouch 100 UNIT/ML FlexTouch Pen Generic drug: insulin degludec Inject 0.18 mLs (18 Units total) into the skin at bedtime.   Trulicity 1.5 WU/9.8JX Sopn Generic drug: Dulaglutide INJECT 1.5 MG INTO THE SKIN ONCE A WEEK.   warfarin 5 MG tablet Commonly known as: COUMADIN Take as directed by the anticoagulation clinic. If you are unsure how to take this medication, talk to your nurse or doctor. Original instructions: Take 1 tablet (5 mg total) by mouth every evening. What changed: additional instructions       History (reviewed): Past Medical History:  Diagnosis Date  . CAD (coronary artery disease)    LHC 2/08:  mRCA 50%, o/w no sig CAD, EF 25%  . Cardiomyopathy ([place])    EF 35-40% in 2/16 in setting of AF with RVR >> echo 5/16 with improved LVF with EF 65-70%  . Chronic atrial fibrillation ([place])   . Chronic diastolic heart failure ([facility])    HFPF - EF ~65-70% (OFTEN EXACERBATED BY AFIB)  . CKD (chronic kidney disease)    hx of worsening renal failure  and hyperK+ in setting of acute diast CHF >> required CVVHD in 4/16 and dialysis x 1 in 5/16  . CKD stage 3 due to type 2 diabetes mellitus ([place]) 09/17/2015  . Colon cancer ([place])   . Colon polyps 09/21/2012   Tubular adenoma  . Diabetes ([place])   . History of echocardiogram    Echo 05/16/14:  mild LVH, vigorous LVF, EF 65-70%, no RWMA, mean AV gradient 9 mmHg, MAC, mild MR, mod LAE, mild RVE, mild reduced RVF, severe RAE  . Hyperlipidemia   . Hypertension   . Obesity hypoventilation syndrome ([place])   . Renal insufficiency   . Sleep apnea    Past Surgical History:  Procedure Laterality Date  . CIRCUMCISION    . COLON RESECTION    . COLONOSCOPY  N/A 09/21/2012   Procedure: COLONOSCOPY;  Surgeon: Inda Castle, Flores;  Location: [facility];  Service: Endoscopy;  Laterality: N/A;  . COLONOSCOPY N/A 12/28/2018   Procedure: COLONOSCOPY;  Surgeon: Rogene Houston, Flores;  Location: [facility];  Service: Endoscopy;  Laterality: N/A;  . ESOPHAGOGASTRODUODENOSCOPY N/A 12/28/2018   Procedure: ESOPHAGOGASTRODUODENOSCOPY (EGD);  Surgeon: Rogene Houston, Flores;  Location: [facility];  Service: Endoscopy;  Laterality: N/A;  . RIGHT HEART CATH N/A 04/11/2019   Procedure: RIGHT HEART CATH;  Surgeon: Larey Dresser, Flores;  Location: [facility];  Service: Cardiovascular;  Laterality: N/A;  . US ECHOCARDIOGRAPHY  03/04/2010   abnormal study   Family History  Problem Relation Flores of Onset  . Breast cancer Mother   . Diabetes Mother   . Heart attack Father    Social History   Socioeconomic History  . Marital status: Divorced    Spouse name: Not on file  . Number of children: 1  . Years of education: 78  . Highest education level: 12th grade  Occupational History    Employer: [employer]  Tobacco Use  . Smoking status: Former Smoker    Quit date: 08/06/1983    Years since quitting: 35.7  . Smokeless tobacco: Never Used  Substance and Sexual Activity  . Alcohol use: No  . Drug use:  No  . Sexual activity: Not Currently  Other Topics Concern  . Not on file  Social History Narrative   Lives with daughter    Social Determinants of Health   Financial Resource Strain: Low Risk   . Difficulty of Paying Living Expenses: Not very hard  Food Insecurity: No Food Insecurity  . Worried About Charity fundraiser in the Last Year: Never true  . Ran Out of Food in the Last Year: Never true  Transportation Needs: No Transportation Needs  . Lack of Transportation (Medical): No  . Lack of Transportation (Non-Medical): No  Physical Activity: Inactive  . Days of Exercise per Week: 0 days  . Minutes of Exercise per Session: 0 min  Stress: No Stress Concern Present  . Feeling of Stress : Only a little  Social Connections: Moderately Isolated  . Frequency of Communication with Friends and Family: More than three times a week  . Frequency of Social Gatherings with Friends and Family: More than three times a week  . Attends Religious Services: Never  . Active Member of Clubs or Organizations: No  . Attends Archivist Meetings: Never  . Marital Status: Divorced    Activities of Daily Living In your present state of health, do you have any difficulty performing the following activities: 04/30/2019 03/28/2019  Hearing? Y N  Vision? N N  Difficulty concentrating or making decisions? N N  Walking or climbing stairs? N Y  Dressing or bathing? N N  Doing errands, shopping? N N  Preparing Food and eating ? N -  Using the Toilet? N -  In the past six months, have you accidently leaked urine? N -  Do you have problems with loss of bowel control? N -  Managing your Medications? N -  Managing your Finances? N -  Housekeeping or managing your Housekeeping? N -  Some recent data might be hidden    Patient Education/ Literacy How often do you need to have someone help you when you read instructions, pamphlets, or other written materials from your doctor or pharmacy?: 1 -  Never What is the  last grade level you completed in school?: 12th Grade  Exercise Current Exercise Habits: The patient does not participate in regular exercise at present, Exercise limited by: None identified  Diet Patient reports consuming 3 meals a day and 0 snack(s) a day Patient reports that his primary diet is: Regular Patient reports that she does have regular access to food.   Depression Screen PHQ 2/9 Scores 04/30/2019 04/26/2019 03/08/2019 02/06/2019 09/03/2018 02/26/2018 02/20/2018  PHQ - 2 Score 0 0 0 0 0 0 0     Fall Risk Fall Risk  04/30/2019 04/26/2019 03/08/2019 02/06/2019 02/20/2018  Falls in the past year? 0 0 0 0 0  Number falls in past yr: 0 - - - -  Injury with Fall? 0 - - - -  Risk for fall due to : History of fall(s) - - - -  Follow up Falls evaluation completed - - - -     Objective:  William Flores seemed alert and oriented and he participated appropriately during our telephone visit.  Blood Pressure Weight BMI  BP Readings from Last 3 Encounters:  04/29/19 138/86  04/26/19 139/77  04/15/19 128/72   Wt Readings from Last 3 Encounters:  04/29/19 260 lb 8 oz (118.2 kg)  04/26/19 271 lb 6 oz (123.1 kg)  04/15/19 262 lb 6.4 oz (119 kg)   BMI Readings from Last 1 Encounters:  04/29/19 44.71 kg/m    *Unable to obtain current vital signs, weight, and BMI due to telephone visit type  Hearing/Vision  . Philopater did  seem to have difficulty with hearing/understanding during the telephone conversation . Reports that he has not had a formal eye exam by an eye care professional within the past year . Reports that he has not had a formal hearing evaluation within the past year *Unable to fully assess hearing and vision during telephone visit type  Cognitive Function: 6CIT Screen 04/30/2019  What Year? 0 points  What month? 0 points  What time? 0 points  Count back from 20 0 points  Months in reverse (No Data)  Repeat phrase 0 points   (Normal:0-7, Significant for  Dysfunction: >8)  Normal Cognitive Function Screening: Yes   Immunization & Health Maintenance Record Immunization History  Administered Date(s) Administered  . Fluad Quad(high Dose 65+) 12/26/2018  . Influenza, High Dose Seasonal PF 01/25/2017  . Influenza,inj,Quad PF,6+ Mos 11/01/2012, 10/07/2013, 10/27/2014, 10/15/2015  . Pneumococcal Conjugate-13 10/27/2014  . Pneumococcal Polysaccharide-23 08/06/2013  . Tdap 08/19/2013    Health Maintenance  Topic Date Due  . COVID-19 Vaccine (1) Never done  . OPHTHALMOLOGY EXAM  11/13/2015  . FOOT EXAM  04/12/2017  . PNA vac Low Risk Adult (2 of 2 - PPSV23) 08/07/2018  . URINE MICROALBUMIN  05/02/2019  . INFLUENZA VACCINE  08/04/2019  . HEMOGLOBIN A1C  09/28/2019  . TETANUS/TDAP  08/20/2023  . COLONOSCOPY  12/28/2023  . Hepatitis C Screening  Completed       Assessment  This is a routine wellness examination for William Flores.  Health Maintenance: Due or Overdue Health Maintenance Due  Topic Date Due  . COVID-19 Vaccine (1) Never done  . OPHTHALMOLOGY EXAM  11/13/2015  . FOOT EXAM  04/12/2017  . PNA vac Low Risk Adult (2 of 2 - PPSV23) 08/07/2018  . URINE MICROALBUMIN  05/02/2019    William Flores does not need a referral for Community Assistance: Care Management:   no Social Work:    no Prescription Assistance:  no  Nutrition/Diabetes Education:  no   Plan:  Personalized Goals Goals Addressed            This Visit's Progress   .  Acknowledge receipt of Programme researcher, broadcasting/film/video      . Exercise 3x per week (30 min per time)       04/30/2019 AWV Goal: Exercise for General Health   Patient will verbalize understanding of the benefits of increased physical activity:  Exercising regularly is important. It will improve your overall fitness, flexibility, and endurance.  Regular exercise also will improve your overall health. It can help you control your weight, reduce stress, and improve your bone density.  Over  the next year, patient will increase physical activity as tolerated with a goal of at least 150 minutes of moderate physical activity per week.   You can tell that you are exercising at a moderate intensity if your heart starts beating faster and you start breathing faster but can still hold a conversation.  Moderate-intensity exercise ideas include:  Walking 1 mile (1.6 km) in about 15 minutes  Biking  Hiking  Golfing  Dancing  Water aerobics  Patient will verbalize understanding of everyday activities that increase physical activity by providing examples like the following: ? Yard work, such as: ? Pushing a Conservation officer, nature ? Raking and bagging leaves ? Washing your car ? Pushing a stroller ? Shoveling snow ? Gardening ? Washing windows or floors  Patient will be able to explain general safety guidelines for exercising:   Before you start a new exercise program, talk with your health care provider.  Do not exercise so much that you hurt yourself, feel dizzy, or get very short of breath.  Wear comfortable clothes and wear shoes with good support.  Drink plenty of water while you exercise to prevent dehydration or heat stroke.  Work out until your breathing and your heartbeat get faster.       Personalized Health Maintenance & Screening Recommendations  Pneumococcal vaccine  Diabetes screening Glaucoma screening Advanced directives: has NO advanced directive  - add't info requested. Referral to SW: no  Lung Cancer Screening Recommended: no (Low Dose CT Chest recommended if Flores 55-80 years, 30 pack-year currently smoking OR have quit w/in past 15 years) Hepatitis C Screening recommended: no HIV Screening recommended: yes  Advanced Directives: Written information was prepared per patient's request.  Referrals & Orders No orders of the defined types were placed in this encounter.   Follow-up Plan . Follow-up with William Flores as planned . Schedule an Eye  Exam    I have personally reviewed and noted the following in the patient's chart:   . Medical and social history . Use of alcohol, tobacco or illicit drugs  . Current medications and supplements . Functional ability and status . Nutritional status . Physical activity . Advanced directives . List of other physicians . Hospitalizations, surgeries, and ER visits in previous 12 months . Vitals . Screenings to include cognitive, depression, and falls . Referrals and appointments  In addition, I have reviewed and discussed with William Flores certain preventive protocols, quality metrics, and best practice recommendations. A written personalized care plan for preventive services as well as general preventive health recommendations is available and can be mailed to the patient at his request.      Wardell Heath, LPN  [id number]   AVS Printed and mailed to patient  I have reviewed and agree with the above AWV documentation Caryl Pina,  Flores [facility] 05/03/2019, 7:49 AM

## 2019-04-30 NOTE — Patient Instructions (Signed)
[place] [facility] Maintenance Summary and Written Plan of Care  [name] ,  Thank you for allowing me to perform your Medicare Annual Wellness Visit and for your ongoing commitment to your health.   Health Maintenance & Immunization History Health Maintenance  Topic Date Due  . COVID-19 Vaccine (1) Never done  . OPHTHALMOLOGY EXAM  11/13/2015  . FOOT EXAM  04/12/2017  . PNA vac Low Risk Adult (2 of 2 - PPSV23) 08/07/2018  . URINE MICROALBUMIN  05/02/2019  . INFLUENZA VACCINE  08/04/2019  . HEMOGLOBIN A1C  09/28/2019  . TETANUS/TDAP  08/20/2023  . COLONOSCOPY  12/28/2023  . Hepatitis C Screening  Completed   Immunization History  Administered Date(s) Administered  . Fluad Quad(high Dose 65+) 12/26/2018  . Influenza, High Dose Seasonal PF 01/25/2017  . Influenza,inj,Quad PF,6+ Mos 11/01/2012, 10/07/2013, 10/27/2014, 10/15/2015  . Pneumococcal Conjugate-13 10/27/2014  . Pneumococcal Polysaccharide-23 08/06/2013  . Tdap 08/19/2013    These are the patient goals that we discussed: Goals Addressed            This Visit's Progress   .  Acknowledge receipt of Programme researcher, broadcasting/film/video      . Exercise 3x per week (30 min per time)       04/30/2019 AWV Goal: Exercise for General Health   Patient will verbalize understanding of the benefits of increased physical activity:  Exercising regularly is important. It will improve your overall fitness, flexibility, and endurance.  Regular exercise also will improve your overall health. It can help you control your weight, reduce stress, and improve your bone density.  Over the next year, patient will increase physical activity as tolerated with a goal of at least 150 minutes of moderate physical activity per week.   You can tell that you are exercising at a moderate intensity if your heart starts beating faster and you start breathing faster but can still hold a conversation.  Moderate-intensity exercise  ideas include:  Walking 1 mile (1.6 km) in about 15 minutes  Biking  Hiking  Golfing  Dancing  Water aerobics  Patient will verbalize understanding of everyday activities that increase physical activity by providing examples like the following: ? Yard work, such as: ? Pushing a Conservation officer, nature ? Raking and bagging leaves ? Washing your car ? Pushing a stroller ? Shoveling snow ? Gardening ? Washing windows or floors  Patient will be able to explain general safety guidelines for exercising:   Before you start a new exercise program, talk with your health care provider.  Do not exercise so much that you hurt yourself, feel dizzy, or get very short of breath.  Wear comfortable clothes and wear shoes with good support.  Drink plenty of water while you exercise to prevent dehydration or heat stroke.  Work out until your breathing and your heartbeat get faster.         This is a list of Health Maintenance Items that are overdue or due now: Health Maintenance Due  Topic Date Due  . COVID-19 Vaccine (1) Never done  . OPHTHALMOLOGY EXAM  11/13/2015  . FOOT EXAM  04/12/2017  . PNA vac Low Risk Adult (2 of 2 - PPSV23) 08/07/2018  . URINE MICROALBUMIN  05/02/2019     Orders/Referrals Placed Today: No orders of the defined types were placed in this encounter.  (Contact our referral department at [phone] if you have not spoken with someone about your referral appointment within the next 5 days)  Follow-up Plan Follow up with Dr. Warrick Parisian as scheduled.   Advance Directive  Advance directives are legal documents that let you make choices ahead of time about your health care and medical treatment in case you become unable to communicate for yourself. Advance directives are a way for you to make known your wishes to family, friends, and health care providers. This can let others know about your end-of-life care if you become unable to communicate. Discussing and writing  advance directives should happen over time rather than all at once. Advance directives can be changed depending on your situation and what you want, even after you have signed the advance directives. There are different types of advance directives, such as:  Medical power of attorney.  Living will.  Do not resuscitate (DNR) or do not attempt resuscitation (DNAR) order. Health care proxy and medical power of attorney A health care proxy is also called a health care agent. This is a person who is appointed to make medical decisions for you in cases where you are unable to make the decisions yourself. Generally, people choose someone they know well and trust to represent their preferences. Make sure to ask this person for an agreement to act as your proxy. A proxy may have to exercise judgment in the event of a medical decision for which your wishes are not known. A medical power of attorney is a legal document that names your health care proxy. Depending on the laws in your state, after the document is written, it may also need to be:  Signed.  Notarized.  Dated.  Copied.  Witnessed.  Incorporated into your medical record. You may also want to appoint someone to manage your money in a situation in which you are unable to do so. This is called a durable power of attorney for finances. It is a separate legal document from the durable power of attorney for health care. You may choose the same person or someone different from your health care proxy to act as your agent in money matters. If you do not appoint a proxy, or if there is a concern that the proxy is not acting in your best interests, a court may appoint a guardian to act on your behalf. Living will A living will is a set of instructions that state your wishes about medical care when you cannot express them yourself. Health care providers should keep a copy of your living will in your medical record. You may want to give a copy to family  members or friends. To alert caregivers in case of an emergency, you can place a card in your wallet to let them know that you have a living will and where they can find it. A living will is used if you become:  Terminally ill.  Disabled.  Unable to communicate or make decisions. Items to consider in your living will include:  To use or not to use life-support equipment, such as dialysis machines and breathing machines (ventilators).  A DNR or DNAR order. This tells health care providers not to use cardiopulmonary resuscitation (CPR) if breathing or heartbeat stops.  To use or not to use tube feeding.  To be given or not to be given food and fluids.  Comfort (palliative) care when the goal becomes comfort rather than a cure.  Donation of organs and tissues. A living will does not give instructions for distributing your money and property if you should pass away. DNR or DNAR A DNR or DNAR  order is a request not to have CPR in the event that your heart stops beating or you stop breathing. If a DNR or DNAR order has not been made and shared, a health care provider will try to help any patient whose heart has stopped or who has stopped breathing. If you plan to have surgery, talk with your health care provider about how your DNR or DNAR order will be followed if problems occur. What if I do not have an advance directive? If you do not have an advance directive, some states assign family decision makers to act on your behalf based on how closely you are related to them. Each state has its own laws about advance directives. You may want to check with your health care provider, attorney, or state representative about the laws in your state. Summary  Advance directives are the legal documents that allow you to make choices ahead of time about your health care and medical treatment in case you become unable to tell others about your care.  The process of discussing and writing advance directives  should happen over time. You can change the advance directives, even after you have signed them.  Advance directives include DNR or DNAR orders, living wills, and designating an agent as your medical power of attorney. This information is not intended to replace advice given to you by your health care provider. Make sure you discuss any questions you have with your health care provider. Document Revised: 07/19/2018 Document Reviewed: 07/19/2018 Elsevier Patient Education  [place].

## 2019-04-30 NOTE — Addendum Note (Signed)
Addended by: Antonietta Barcelona D on: 04/30/2019 03:06 PM   Modules accepted: Orders

## 2019-05-01 ENCOUNTER — Other Ambulatory Visit (HOSPITAL_COMMUNITY): Payer: Medicare Other

## 2019-05-09 ENCOUNTER — Encounter (HOSPITAL_COMMUNITY): Payer: Medicare Other

## 2019-05-09 ENCOUNTER — Other Ambulatory Visit (HOSPITAL_COMMUNITY): Payer: Medicare Other

## 2019-05-14 ENCOUNTER — Ambulatory Visit (HOSPITAL_COMMUNITY): Payer: Medicare Other | Admitting: Nurse Practitioner

## 2019-05-17 ENCOUNTER — Encounter (HOSPITAL_COMMUNITY)
Admission: RE | Admit: 2019-05-17 | Discharge: 2019-05-17 | Disposition: A | Discharge status: Home / Self Care | Payer: Medicare Other | Source: Ambulatory Visit | Attending: Cardiology | Admitting: Cardiology

## 2019-05-17 ENCOUNTER — Other Ambulatory Visit: Payer: Self-pay

## 2019-05-17 DIAGNOSIS — I429 Cardiomyopathy, unspecified: Secondary | ICD-10-CM | POA: Diagnosis not present

## 2019-05-17 DIAGNOSIS — I428 Other cardiomyopathies: Secondary | ICD-10-CM | POA: Insufficient documentation

## 2019-05-17 MED ORDER — TECHNETIUM TC 99M PYROPHOSPHATE
21.1000 | Freq: Once | INTRAVENOUS | Status: AC | PRN
  Administered 2019-05-17: 21.1 via INTRAVENOUS
  Filled 2019-05-17: qty 22

## 2019-05-21 ENCOUNTER — Other Ambulatory Visit (HOSPITAL_COMMUNITY): Payer: Self-pay | Admitting: *Deleted

## 2019-05-21 MED ORDER — TORSEMIDE 20 MG PO TABS: 40.0000 mg | ORAL_TABLET | Freq: Two times a day (BID) | ORAL | 3 refills | Status: DC

## 2019-05-28 ENCOUNTER — Ambulatory Visit (INDEPENDENT_AMBULATORY_CARE_PROVIDER_SITE_OTHER): Payer: Medicare Other | Admitting: Family Medicine

## 2019-05-28 ENCOUNTER — Encounter: Payer: Self-pay | Admitting: Family Medicine

## 2019-05-28 ENCOUNTER — Other Ambulatory Visit: Payer: Self-pay

## 2019-05-28 VITALS — BP 147/82 | HR 79 | Temp 97.9°F | Ht 64.0 in | Wt 261.5 lb

## 2019-05-28 DIAGNOSIS — I480 Paroxysmal atrial fibrillation: Secondary | ICD-10-CM

## 2019-05-28 DIAGNOSIS — IMO0002 Reserved for concepts with insufficient information to code with codable children: Secondary | ICD-10-CM

## 2019-05-28 DIAGNOSIS — E785 Hyperlipidemia, unspecified: Secondary | ICD-10-CM

## 2019-05-28 DIAGNOSIS — E1165 Type 2 diabetes mellitus with hyperglycemia: Secondary | ICD-10-CM | POA: Diagnosis not present

## 2019-05-28 DIAGNOSIS — N183 Chronic kidney disease, stage 3 unspecified: Secondary | ICD-10-CM | POA: Diagnosis not present

## 2019-05-28 DIAGNOSIS — I10 Essential (primary) hypertension: Secondary | ICD-10-CM

## 2019-05-28 DIAGNOSIS — E1129 Type 2 diabetes mellitus with other diabetic kidney complication: Secondary | ICD-10-CM

## 2019-05-28 DIAGNOSIS — B351 Tinea unguium: Secondary | ICD-10-CM | POA: Diagnosis not present

## 2019-05-28 DIAGNOSIS — E1122 Type 2 diabetes mellitus with diabetic chronic kidney disease: Secondary | ICD-10-CM

## 2019-05-28 DIAGNOSIS — E1169 Type 2 diabetes mellitus with other specified complication: Secondary | ICD-10-CM

## 2019-05-28 LAB — COAGUCHEK XS/INR WAIVED
INR: 2.8 — ABNORMAL HIGH (ref 0.9–1.1)
Prothrombin Time: 33 s

## 2019-05-28 LAB — BAYER DCA HB A1C WAIVED: HB A1C (BAYER DCA - WAIVED): 7.3 % — ABNORMAL HIGH (ref <7.0)

## 2019-05-28 NOTE — Progress Notes (Signed)
BP (!) 147/82   Pulse 79   Temp 97.9 F (36.6 C) (Temporal)   Ht 5' 4"  (1.626 m)   Wt 261 lb 8 oz (118.6 kg)   BMI 44.89 kg/m    Subjective:   Patient ID: William Flores, male    DOB: February 07, 1950, 69 y.o.   MRN: [id number]  HPI: William Flores is a 69 y.o. male presenting on 05/28/2019 for Medical Management of Chronic Issues   HPI Type 2 diabetes mellitus Patient comes in today for recheck of his diabetes. Patient has been currently taking [place] and NovoLog and Trulicity. Patient is not currently on an ACE inhibitor/ARB. Patient has not seen an ophthalmologist this year. Patient denies any issues with their feet. The symptom started onset as an adult hypertension and cholesterol and CKD stage III ARE RELATED TO DM   Coumadin recheck Target goal: 2.0-3.0 Reason on anticoagulation: Paroxysmal A. fib Patient denies any bruising or bleeding or chest pain or palpitations   Hyperlipidemia Patient is coming in for recheck of his hyperlipidemia. The patient is currently taking atorvastatin. They deny any issues with myalgias or history of liver damage from it. They deny any focal numbness or weakness or chest pain.   Hypertension Patient is currently on metoprolol, and their blood pressure today is 147/82. Patient denies any lightheadedness or dizziness. Patient denies headaches, blurred vision, chest pains, shortness of breath, or weakness. Denies any side effects from medication and is content with current medication.   Relevant past medical, surgical, family and social history reviewed and updated as indicated. Interim medical history since our last visit reviewed. Allergies and medications reviewed and updated.  Review of Systems  Constitutional: Negative for chills and fever.  Respiratory: Negative for shortness of breath and wheezing.   Cardiovascular: Negative for chest pain and leg swelling.  Musculoskeletal: Negative for back pain and gait problem.  Skin: Positive for  color change (Thickened yellow toenails with scaling on the bottom of his feet.). Negative for rash.  All other systems reviewed and are negative.   Per HPI unless specifically indicated above   Allergies as of 05/28/2019      Reactions   Codeine    Headache      Medication List       Accurate as of May 28, 2019  2:45 PM. If you have any questions, ask your nurse or doctor.        STOP taking these medications   polyvinyl alcohol 1.4 % ophthalmic solution Commonly known as: LIQUIFILM TEARS Stopped by: [name] Artemisa Sladek, MD     TAKE these medications   acetaminophen 325 MG tablet Commonly known as: TYLENOL Take 2 tablets (650 mg total) by mouth every 6 (six) hours as needed for mild pain (or Fever >/= 101).   allopurinol 300 MG tablet Commonly known as: ZYLOPRIM TAKE 1 TABLET BY MOUTH EVERY DAY   atorvastatin 20 MG tablet Commonly known as: LIPITOR 1 tablet daily   azelastine 0.05 % ophthalmic solution Commonly known as: OPTIVAR PLACE 2 DROPS INTO BOTH EYES 2 (TWO) TIMES DAILY. What changed: See the new instructions.   benzonatate 100 MG capsule Commonly known as: Tessalon Perles Take 1 capsule (100 mg total) by mouth 2 (two) times daily. What changed:   when to take this  reasons to take this   BLOOD GLUCOSE TEST STRIPS Strp Test BS TID Dx E11.29   Farxiga 5 MG Tabs tablet Generic drug: dapagliflozin propanediol Take 5 mg by  mouth daily.   ferrous sulfate 325 (65 FE) MG tablet Take 325 mg by mouth daily with breakfast.   Insulin Pen Needle 32G X 4 MM Misc Use to give insulin daily Dx E11.29   metoprolol succinate 50 MG 24 hr tablet Commonly known as: TOPROL-XL Take 1 tablet (50 mg total) by mouth daily. Take with or immediately following a meal.   NovoLOG FlexPen 100 UNIT/ML FlexPen Generic drug: insulin aspart insulin aspart (novoLOG) injection 0-9 Units 0-9 Units, Subcutaneous, 3 times daily with meals, CBG < 70: Implement Hypoglycemia  Standing Orders and refer to Hypoglycemia Standing Orders sidebar report CBG 70 - 120: 0 units CBG 121 - 150: 1 unit CBG 151 - 200: 2 units CBG 201 - 250: 3 units CBG 251 - 300: 5 units CBG 301 - 350: 7 units CBG 351 - 400: 9 units CBG > 400 What changed:   how much to take  how to take this  when to take this   polyethylene glycol 17 g packet Commonly known as: MIRALAX / GLYCOLAX Take 17 g by mouth daily. What changed:   when to take this  reasons to take this   potassium chloride SA 20 MEQ tablet Commonly known as: Klor-Con M20 Take 1 tablet (20 mEq total) by mouth daily.   sildenafil 20 MG tablet Commonly known as: REVATIO Take 1 tablet (20 mg total) by mouth 3 (three) times daily.   torsemide 20 MG tablet Commonly known as: DEMADEX Take 2 tablets (40 mg total) by mouth 2 (two) times daily.   Tyler Aas FlexTouch 100 UNIT/ML FlexTouch Pen Generic drug: insulin degludec Inject 0.18 mLs (18 Units total) into the skin at bedtime.   Trulicity 1.5 XT/0.2IO Sopn Generic drug: Dulaglutide INJECT 1.5 MG INTO THE SKIN ONCE A WEEK.   warfarin 5 MG tablet Commonly known as: COUMADIN Take as directed by the anticoagulation clinic. If you are unsure how to take this medication, talk to your nurse or doctor. Original instructions: Take 1 tablet (5 mg total) by mouth every evening. What changed: additional instructions        Objective:   BP (!) 147/82   Pulse 79   Temp 97.9 F (36.6 C) (Temporal)   Ht 5' 4"  (1.626 m)   Wt 261 lb 8 oz (118.6 kg)   BMI 44.89 kg/m   Wt Readings from Last 3 Encounters:  05/28/19 261 lb 8 oz (118.6 kg)  04/29/19 260 lb 8 oz (118.2 kg)  04/26/19 271 lb 6 oz (123.1 kg)    Physical Exam Vitals and nursing note reviewed.  Constitutional:      General: He is not in acute distress.    Appearance: He is well-developed. He is not diaphoretic.  Eyes:     General: No scleral icterus.    Conjunctiva/sclera: Conjunctivae normal.  Neck:      Thyroid: No thyromegaly.  Cardiovascular:     Rate and Rhythm: Normal rate and regular rhythm.     Heart sounds: Normal heart sounds. No murmur.  Pulmonary:     Effort: Pulmonary effort is normal. No respiratory distress.     Breath sounds: Normal breath sounds. No wheezing.  Musculoskeletal:        General: Normal range of motion.     Cervical back: Neck supple.  Lymphadenopathy:     Cervical: No cervical adenopathy.  Skin:    General: Skin is warm and dry.     Findings: No rash.  Neurological:  Mental Status: He is alert and oriented to person, place, and time.     Coordination: Coordination normal.  Psychiatric:        Behavior: Behavior normal.       Assessment & Plan:   Problem List Items Addressed This Visit      Cardiovascular and Mediastinum   Essential hypertension     Endocrine   DM (diabetes mellitus), type 2, uncontrolled, with renal complications ([place]) - Primary (Chronic)   Relevant Orders   Bayer DCA Hb A1c Waived   Microalbumin / creatinine urine ratio   CKD stage 3 due to type 2 diabetes mellitus ([place])   Hyperlipidemia associated with type 2 diabetes mellitus ([place])    Other Visit Diagnoses    Paroxysmal atrial fibrillation (HCC)       Relevant Orders   CoaguChek XS/INR Waived   Onychomycosis          Recommended for patient to go back to his podiatrist Dr. Irving Shows for his toenails. A1c is 7.3, no medication changes but will monitor and focus on diet. Keeping a close eye and kidneys because of his diuresis. Follow up plan: Return if symptoms worsen or fail to improve, for Return in 4 to 6 weeks for INR recheck.  Counseling provided for all of the vaccine components Orders Placed This Encounter  Procedures  . Bayer DCA Hb A1c Waived  . CoaguChek XS/INR Waived  . Microalbumin / creatinine urine ratio    Caryl Pina, MD [facility] 05/28/2019, 2:45 PM

## 2019-05-29 LAB — MICROALBUMIN / CREATININE URINE RATIO
Creatinine, Urine: 27.4 mg/dL
Microalb/Creat Ratio: 2185 mg/g creat — ABNORMAL HIGH (ref 0–29)
Microalbumin, Urine: 598.7 ug/mL

## 2019-06-03 ENCOUNTER — Other Ambulatory Visit: Payer: Self-pay | Admitting: Family Medicine

## 2019-06-13 ENCOUNTER — Encounter (HOSPITAL_COMMUNITY): Payer: Self-pay | Admitting: Cardiology

## 2019-06-13 ENCOUNTER — Ambulatory Visit (HOSPITAL_COMMUNITY)
Admission: RE | Admit: 2019-06-13 | Discharge: 2019-06-13 | Disposition: A | Discharge status: Home / Self Care | Payer: Medicare Other | Source: Ambulatory Visit | Attending: Cardiology | Admitting: Cardiology

## 2019-06-13 ENCOUNTER — Other Ambulatory Visit: Payer: Self-pay

## 2019-06-13 VITALS — BP 132/68 | HR 102 | Ht 64.0 in | Wt 264.6 lb

## 2019-06-13 DIAGNOSIS — I429 Cardiomyopathy, unspecified: Secondary | ICD-10-CM | POA: Insufficient documentation

## 2019-06-13 DIAGNOSIS — I272 Pulmonary hypertension, unspecified: Secondary | ICD-10-CM | POA: Diagnosis not present

## 2019-06-13 DIAGNOSIS — I130 Hypertensive heart and chronic kidney disease with heart failure and stage 1 through stage 4 chronic kidney disease, or unspecified chronic kidney disease: Secondary | ICD-10-CM | POA: Insufficient documentation

## 2019-06-13 DIAGNOSIS — I2729 Other secondary pulmonary hypertension: Secondary | ICD-10-CM | POA: Insufficient documentation

## 2019-06-13 DIAGNOSIS — I5042 Chronic combined systolic (congestive) and diastolic (congestive) heart failure: Secondary | ICD-10-CM | POA: Insufficient documentation

## 2019-06-13 DIAGNOSIS — Z794 Long term (current) use of insulin: Secondary | ICD-10-CM | POA: Insufficient documentation

## 2019-06-13 DIAGNOSIS — I2781 Cor pulmonale (chronic): Secondary | ICD-10-CM | POA: Insufficient documentation

## 2019-06-13 DIAGNOSIS — G4733 Obstructive sleep apnea (adult) (pediatric): Secondary | ICD-10-CM | POA: Diagnosis not present

## 2019-06-13 DIAGNOSIS — Z87891 Personal history of nicotine dependence: Secondary | ICD-10-CM | POA: Insufficient documentation

## 2019-06-13 DIAGNOSIS — E1122 Type 2 diabetes mellitus with diabetic chronic kidney disease: Secondary | ICD-10-CM | POA: Diagnosis not present

## 2019-06-13 DIAGNOSIS — N183 Chronic kidney disease, stage 3 unspecified: Secondary | ICD-10-CM | POA: Diagnosis not present

## 2019-06-13 DIAGNOSIS — Z9981 Dependence on supplemental oxygen: Secondary | ICD-10-CM | POA: Diagnosis not present

## 2019-06-13 DIAGNOSIS — Z7901 Long term (current) use of anticoagulants: Secondary | ICD-10-CM | POA: Insufficient documentation

## 2019-06-13 DIAGNOSIS — I4891 Unspecified atrial fibrillation: Secondary | ICD-10-CM | POA: Diagnosis not present

## 2019-06-13 DIAGNOSIS — Z85038 Personal history of other malignant neoplasm of large intestine: Secondary | ICD-10-CM | POA: Insufficient documentation

## 2019-06-13 DIAGNOSIS — E785 Hyperlipidemia, unspecified: Secondary | ICD-10-CM | POA: Insufficient documentation

## 2019-06-13 DIAGNOSIS — Z885 Allergy status to narcotic agent status: Secondary | ICD-10-CM | POA: Diagnosis not present

## 2019-06-13 DIAGNOSIS — Z833 Family history of diabetes mellitus: Secondary | ICD-10-CM | POA: Diagnosis not present

## 2019-06-13 DIAGNOSIS — Z79899 Other long term (current) drug therapy: Secondary | ICD-10-CM | POA: Diagnosis not present

## 2019-06-13 DIAGNOSIS — I739 Peripheral vascular disease, unspecified: Secondary | ICD-10-CM | POA: Diagnosis not present

## 2019-06-13 DIAGNOSIS — Z9989 Dependence on other enabling machines and devices: Secondary | ICD-10-CM | POA: Diagnosis not present

## 2019-06-13 DIAGNOSIS — D472 Monoclonal gammopathy: Secondary | ICD-10-CM | POA: Insufficient documentation

## 2019-06-13 DIAGNOSIS — I50812 Chronic right heart failure: Secondary | ICD-10-CM | POA: Diagnosis not present

## 2019-06-13 DIAGNOSIS — I482 Chronic atrial fibrillation, unspecified: Secondary | ICD-10-CM | POA: Insufficient documentation

## 2019-06-13 DIAGNOSIS — Z8249 Family history of ischemic heart disease and other diseases of the circulatory system: Secondary | ICD-10-CM | POA: Diagnosis not present

## 2019-06-13 LAB — BASIC METABOLIC PANEL
Anion gap: 10 (ref 5–15)
BUN: 41 mg/dL — ABNORMAL HIGH (ref 8–23)
CO2: 22 mmol/L (ref 22–32)
Calcium: 9.6 mg/dL (ref 8.9–10.3)
Chloride: 106 mmol/L (ref 98–111)
Creatinine, Ser: 1.65 mg/dL — ABNORMAL HIGH (ref 0.61–1.24)
GFR calc Af Amer: 49 mL/min — ABNORMAL LOW (ref >60)
GFR calc non Af Amer: 42 mL/min — ABNORMAL LOW (ref >60)
Glucose, Bld: 279 mg/dL — ABNORMAL HIGH (ref 70–99)
Potassium: 4.8 mmol/L (ref 3.5–5.1)
Sodium: 138 mmol/L (ref 135–145)

## 2019-06-13 LAB — MAGNESIUM: Magnesium: 2.1 mg/dL (ref 1.7–2.4)

## 2019-06-13 MED ORDER — SILDENAFIL CITRATE 20 MG PO TABS: 20.0000 mg | ORAL_TABLET | Freq: Three times a day (TID) | ORAL | 6 refills | Status: DC

## 2019-06-13 MED ORDER — TORSEMIDE 20 MG PO TABS: ORAL_TABLET | ORAL | 3 refills | Status: DC

## 2019-06-13 NOTE — Patient Instructions (Signed)
Increase Torsemide to 60 mg (3 tabs) in AM and 40 mg (2 tabs) in PM  Restart Sildenafil 20 mg Three times a day   Labs done today, your results will be available in MyChart, we will contact you for abnormal readings.  Labs needed in 10-14 days, we have provided you a prescription to have these done at your Primary Care Providers office  Please call and schedule a follow up appointment with Hematology/Oncology  Please follow up with primary care provider about a referral to [place] physician recommends that you schedule a follow-up appointment in: 6-8 weeks  If you have any questions or concerns before your next appointment please send [place] a message through [place] or call our office at [phone].    TO LEAVE A MESSAGE FOR THE NURSE SELECT OPTION 2, PLEASE LEAVE A MESSAGE INCLUDING: . YOUR NAME . DATE OF BIRTH . CALL BACK NUMBER . REASON FOR CALL**this is important as we prioritize the call backs  [place] AS LONG AS YOU CALL BEFORE 4:00 PM  At the [facility], you and your health needs are our priority. As part of our continuing mission to provide you with exceptional heart care, we have created designated Provider Care Teams. These Care Teams include your primary Cardiologist (physician) and Advanced Practice Providers (APPs- Physician Assistants and Nurse Practitioners) who all work together to provide you with the care you need, when you need it.   You may see any of the following providers on your designated Care Team at your next follow up: Marland Kitchen Dr Glori Bickers . Dr Loralie Champagne . Darrick Grinder, NP . Lyda Jester, PA . Audry Riles, PharmD   Please be sure to bring in all your medications bottles to every appointment.

## 2019-06-15 NOTE — Progress Notes (Signed)
Advanced Heart Failure Clinic Note   PCP: [name], [name], MD HF Cardiology: Dr. Aundra Dubin  HPI:  69 y.o. male with history of chronic atrial fibrillation, chronic anticoagulation therapy w/ coumadin, h/o systolic HF (felt to be tachy-mediated, w/ eventual recovery of LVEF), chronic diastolic CHF, RV failure w/ pulmonary HTN, Diabetes, Obesity, OHS/OSA w/ poor compliance w/ CPAP, h/o AKI, h/o GIB, colon cancer treated w/ resection and h/o MGUS.   Patient was admitted in 02/2014 with hypoxic respiratory failure with acute systolic CHF and possible PNA. He was in atrial fibrillation with RVR and developed AKI. Cardiomyopathy (EF 35-40% by echo in 2016) thought to be tachycardia-mediated at the time. He was admitted again in 4/16 with hypotension and AKI as well as multiple metabolic derangements. He required CVVH. Echo at that time showed EF 60-65%. He was again admitted in 5/16, again with hypoxic respiratory failure and was intubated. EF 60-65% by echo on this admission. He again had AKI and had HD x 1. Renal function again recovered. Also, per records, he had a LHC in 2008 that showed no significant CAD.   Had repeat echo 12/2018 which showed normal LVEF 50-55% w/ moderate LVH, normal LA size, mild MR, severely elevated pulmonary artery systolic pressure, 77 mmHg and moderately reduced right ventricular systolic function. Echo was obtained while admitted for acute anemia 2/2 acute GIB, in the setting of supratherpaeutic INR. INR was > 10.  Hgb 5.1. INR was reversed w/ Kcentra + Vit K. Treated w/ transfusion. Underwent GI w/u. EGD showed chronic gastritis but no clear source of bleeding. Biopsy negative for H Pylori. Colonoscopy showed several polyps, removed and biopsied. No high grade dysplasia. He was placed back on Coumadin. INRs followed by PCP.   Admitted to [facility] for acute on chronic CHF 3/21. Also w/ AKI on admit. SCr was 1.8 (baseline 1.3-1.5). He was started on IV lasix for  diuresis, 60 mg bid. Diuresis sluggish and eventually changed to lasix gtt. SCr has worsened w/ attempts at diuresis, w/ SCr rising to 2.3. Echo repeated. LVEF 55-60%. RV severely enlarged w/ severely reduced RV systolic function. Pulmonary artery systolic pressure severely elevated at 76 mmhg. There is mild TR. Also mild to moderate aortic valve sclerosis/calcification is present, without any evidence of aortic stenosis. He was subsequently transferred to [facility] for further management of his HF w/ formal RHC and AHF consultation.  On arrival to [facility], He was placed on high dose IV Lasix w/ better urinary response/ diuresis. RHC 4/8 with elevated filling pressures, mixed pulmonary venous/pulmonary hypertension. V/Q scan negative for chronic PE. He was continued on IV diuretics and diuresed > 20 lb and placed on PO torsemide 40 mg bid.  SCr improved w/ diuresis. Also started on trial of sildenafil 20 mg tid. Advised to continue supplemental home O2 and to restart regular use of CPAP. Also of note, a multiple myeloma panel was done given MGUS and showed elevated M spike protein. Urine immunofixation and UPEP both unremarkable. Discharge wt was 261 lb.   Patient returns today for followup of CHF/RV failure, pulmonary HTN, and OHS/OSA.  He is now using CPAP at night and home oxygen during the day.  Weight is mildly higher.  No dyspnea walking around his house.  No chest pain.  No orthopnea/PND.  He is short of breath walking up a hill or stairs.  Lives with daughter. He gets cramps in his thighs with walking bilaterally. Weight has been stable at home.   ECG (personally reviewed):  atrial fibrillation at 97 with septal Qs  ROS: All systems reviewed and negative except as per HPI.   PMH: 1. Chronic atrial fibrillation.  2. Chronic systolic => diastolic CHF with prominent RV failure: LHC 2008 with no significant CAD but EF 25%.  Echo (2/16) with EF 35-40% (?tachy-mediated CMP).  Echo (5/16) with EF 65-60%, mild MR,  mild RV dilation with mildly decreased RV systolic function.  - RHC (4/21): mean RA 16, PA 95/32 mean 53, mean PCWP 23, CI 2.71, PVR 5 WU.  - echo (4/21): EF 55-60%, severe RV dilation and severe RV dysfunction, PASP 76 mmHg.  - PYP scan (5/21): grade 1, H/CL 0.9 (likely negative).  3. OHS/OSA: Uses CPAP at night, oxygen during the day.  4. Colon cancer s/p resection 5. Type II diabetes 6. CKD stage 3: With episodes of AKI. 7. Hyperlipidemia 8. HTN 9. Pulmonary hypertension: Suspect group 3 PH.  - RHC (4/21): mean RA 16, PA 95/32 mean 53, mean PCWP 23, CI 2.71, PVR 5 WU.  - echo (4/21): EF 55-60%, severe RV dilation and severe RV dysfunction, PASP 76 mmHg.  - V/Q scan 4/21 was negative.  10. H/o GI bleeding.   Current Outpatient Medications  Medication Sig Dispense Refill  . acetaminophen (TYLENOL) 325 MG tablet Take 2 tablets (650 mg total) by mouth every 6 (six) hours as needed for mild pain (or Fever >/= 101). 12 tablet 0  . allopurinol (ZYLOPRIM) 300 MG tablet TAKE 1 TABLET BY MOUTH EVERY DAY 90 tablet 0  . atorvastatin (LIPITOR) 20 MG tablet 1 tablet daily 90 tablet 3  . azelastine (OPTIVAR) 0.05 % ophthalmic solution PLACE 2 DROPS INTO BOTH EYES 2 (TWO) TIMES DAILY. 6 mL 5  . benzonatate (TESSALON PERLES) 100 MG capsule Take 1 capsule (100 mg total) by mouth 2 (two) times daily. 60 capsule 3  . FARXIGA 5 MG TABS tablet Take 5 mg by mouth daily.    . ferrous sulfate 325 (65 FE) MG tablet Take 325 mg by mouth daily with breakfast.     . Glucose Blood (BLOOD GLUCOSE TEST STRIPS) STRP Test BS TID Dx E11.29 300 strip 3  . insulin aspart (NOVOLOG FLEXPEN) 100 UNIT/ML FlexPen insulin aspart (novoLOG) injection 0-9 Units 0-9 Units, Subcutaneous, 3 times daily with meals, CBG < 70: Implement Hypoglycemia Standing Orders and refer to Hypoglycemia Standing Orders sidebar report CBG 70 - 120: 0 units CBG 121 - 150: 1 unit CBG 151 - 200: 2 units CBG 201 - 250: 3 units CBG 251 - 300: 5 units CBG  301 - 350: 7 units CBG 351 - 400: 9 units CBG > 400 (Patient taking differently: Inject 9 Units into the skin in the morning and at bedtime. insulin aspart (novoLOG) injection 0-9 Units 0-9 Units, Subcutaneous, 3 times daily with meals, CBG < 70: Implement Hypoglycemia Standing Orders and refer to Hypoglycemia Standing Orders sidebar report CBG 70 - 120: 0 units CBG 121 - 150: 1 unit CBG 151 - 200: 2 units CBG 201 - 250: 3 units CBG 251 - 300: 5 units CBG 301 - 350: 7 units CBG 351 - 400: 9 units CBG > 400) 15 mL 11  . insulin degludec (TRESIBA FLEXTOUCH) 100 UNIT/ML SOPN FlexTouch Pen Inject 0.18 mLs (18 Units total) into the skin at bedtime. 15 pen 1  . Insulin Pen Needle 32G X 4 MM MISC Use to give insulin daily Dx E11.29 100 each 3  . metoprolol succinate (TOPROL-XL) 50 MG 24  hr tablet Take 1 tablet (50 mg total) by mouth daily. Take with or immediately following a meal. 30 tablet 3  . polyethylene glycol (MIRALAX / GLYCOLAX) 17 g packet Take 17 g by mouth daily. 14 each 0  . potassium chloride SA (KLOR-CON M20) 20 MEQ tablet Take 1 tablet (20 mEq total) by mouth daily. 30 tablet 3  . sildenafil (REVATIO) 20 MG tablet Take 1 tablet (20 mg total) by mouth 3 (three) times daily. 90 tablet 6  . torsemide (DEMADEX) 20 MG tablet Take 3 tablets (60 mg total) by mouth in the morning AND 2 tablets (40 mg total) every evening. 120 tablet 3  . TRULICITY 1.5 SH/7.67YO SOPN INJECT 1.5 MG INTO THE SKIN ONCE A WEEK. 6 mL 0  . warfarin (COUMADIN) 5 MG tablet Take 1 tablet (5 mg total) by mouth every evening. (Patient taking differently: Take 5 mg by mouth every evening. 12/2018: directions were "take as directed". 01/2019: directions were "take 1 tab po qd". Patient has been taking 1 tablet every other day, alternating 1.5 tabs on the other days.) 30 tablet 3   No current facility-administered medications for this encounter.    Allergies  Allergen Reactions  . Codeine     Headache       Social History    Socioeconomic History  . Marital status: Divorced    Spouse name: Not on file  . Number of children: 1  . Years of education: 52  . Highest education level: 12th grade  Occupational History    Employer: [employer]  Tobacco Use  . Smoking status: Former Smoker    Quit date: 08/06/1983    Years since quitting: 35.8  . Smokeless tobacco: Never Used  Substance and Sexual Activity  . Alcohol use: No  . Drug use: No  . Sexual activity: Not Currently  Other Topics Concern  . Not on file  Social History Narrative   Lives with daughter    Social Determinants of Health   Financial Resource Strain: Low Risk   . Difficulty of Paying Living Expenses: Not very hard  Food Insecurity: No Food Insecurity  . Worried About Charity fundraiser in the Last Year: Never true  . Ran Out of Food in the Last Year: Never true  Transportation Needs: No Transportation Needs  . Lack of Transportation (Medical): No  . Lack of Transportation (Non-Medical): No  Physical Activity: Inactive  . Days of Exercise per Week: 0 days  . Minutes of Exercise per Session: 0 min  Stress: No Stress Concern Present  . Feeling of Stress : Only a little  Social Connections: Socially Isolated  . Frequency of Communication with Friends and Family: More than three times a week  . Frequency of Social Gatherings with Friends and Family: More than three times a week  . Attends Religious Services: Never  . Active Member of Clubs or Organizations: No  . Attends Archivist Meetings: Never  . Marital Status: Divorced  Human resources officer Violence: Not At Risk  . Fear of Current or Ex-Partner: No  . Emotionally Abused: No  . Physically Abused: No  . Sexually Abused: No      Family History  Problem Relation Age of Onset  . Breast cancer Mother   . Diabetes Mother   . Heart attack Father     Vitals:   06/13/19 1547  BP: 132/68  Pulse: (!) 102  SpO2: 98%  Weight: 120 kg (264 lb 9.6 oz)  Height: 5'  4" (1.626 m)     PHYSICAL EXAM: General: NAD Neck: JVP 8 cm with HJR, no thyromegaly or thyroid nodule.  Lungs: Crackles at bases.  CV: Nondisplaced PMI.  Heart regular S1/S2, no S3/S4, no murmur.  Trace ankle edema.  No carotid bruit.  Difficult to palpate pedal pulses.  Abdomen: Soft, nontender, no hepatosplenomegaly, no distention.  Skin: Intact without lesions or rashes.  Neurologic: Alert and oriented x 3.  Psych: Normal affect. Extremities: No clubbing or cyanosis.  HEENT: Normal.   ASSESSMENT & PLAN:  1. Cor pulmonale/RV failure: Suspect primarily due to OHS/OSA. Echo in 4/21with EF 55-60%, RV severely dilated w/ severe RV dysfunction in the setting of severe pulmonary hypertension w/ PASP 76 mmHg. [place] 4/21 with elevated filling pressures, mixed pulmonary venous/pulmonary hypertension=> diuresed w/ IV Lasix in hospital and placed on po torsemide. He looks at least mildly volume overloaded, NYHA class III symptoms.  - Increase torsemide to 60 qam/40 qpm.  BMET today and in 10 days.  - He had f/u BMP done at PCP office 3 days ago. I have reviewed lab results and SCr and K both stable.  2. Pulmonary HTN: PASP elevated at 76 mmHg by 4/21 echo estimate with RV systolic function severely reduced. Most likely poorly controlled OHS/OSA as cause (group 3 PH).  V/Q scan negative for chronic PE.  Mixed PVH/PAH on RHC.   - We have tried him on sildenafil, he ran out so will call it in to his pharmacy again, 20 mg tid.  - Continue home oxygen and CPAP at night.   3. Stage III CKD: BMET today.  4. Atrial fibrillation: Chronic, unlikely to be able to successfully cardiovert given long-standing atrial fibrillation.  - Continue Toprol XL 50 mg daily.   - Continue warfarin. INRs followed by PCP. Denies abnormal bleeding  5. OSA/OHS: Continue CPAP at night and oxygen during the day.  6. Diabetes - Management per PCP  7. MGUS: Previously followed by hem/onc but has not seen in several years.   Multiple myeloma panel 4/21 with presence of M-spike. - Will refer back to hem/onc for further w/u 8. ?PAD: Cramps in thighs with walking, difficult to palpate pedal pulses.  - I will arrange for peripheral arterial doppler evaluation.  - Also check K and Mg.   Loralie Champagne, MD 06/15/19

## 2019-07-02 ENCOUNTER — Other Ambulatory Visit (HOSPITAL_COMMUNITY): Payer: Self-pay | Admitting: Cardiology

## 2019-07-02 DIAGNOSIS — I739 Peripheral vascular disease, unspecified: Secondary | ICD-10-CM

## 2019-07-04 ENCOUNTER — Ambulatory Visit (HOSPITAL_COMMUNITY)
Admission: RE | Admit: 2019-07-04 | Payer: Medicare Other | Source: Ambulatory Visit | Attending: Cardiology | Admitting: Cardiology

## 2019-07-13 ENCOUNTER — Other Ambulatory Visit: Payer: Self-pay | Admitting: Family Medicine

## 2019-07-22 ENCOUNTER — Other Ambulatory Visit: Payer: Self-pay | Admitting: Family Medicine

## 2019-07-25 ENCOUNTER — Encounter: Payer: Self-pay | Admitting: Family Medicine

## 2019-07-25 ENCOUNTER — Other Ambulatory Visit: Payer: Self-pay

## 2019-07-25 ENCOUNTER — Ambulatory Visit (INDEPENDENT_AMBULATORY_CARE_PROVIDER_SITE_OTHER): Payer: Medicare Other | Admitting: Family Medicine

## 2019-07-25 VITALS — BP 122/75 | HR 87 | Temp 98.3°F | Ht 64.0 in | Wt 259.0 lb

## 2019-07-25 DIAGNOSIS — E1165 Type 2 diabetes mellitus with hyperglycemia: Secondary | ICD-10-CM | POA: Diagnosis not present

## 2019-07-25 DIAGNOSIS — I272 Pulmonary hypertension, unspecified: Secondary | ICD-10-CM

## 2019-07-25 DIAGNOSIS — E1122 Type 2 diabetes mellitus with diabetic chronic kidney disease: Secondary | ICD-10-CM | POA: Diagnosis not present

## 2019-07-25 DIAGNOSIS — I10 Essential (primary) hypertension: Secondary | ICD-10-CM | POA: Diagnosis not present

## 2019-07-25 DIAGNOSIS — E785 Hyperlipidemia, unspecified: Secondary | ICD-10-CM

## 2019-07-25 DIAGNOSIS — N183 Chronic kidney disease, stage 3 unspecified: Secondary | ICD-10-CM

## 2019-07-25 DIAGNOSIS — E1169 Type 2 diabetes mellitus with other specified complication: Secondary | ICD-10-CM | POA: Diagnosis not present

## 2019-07-25 DIAGNOSIS — E1129 Type 2 diabetes mellitus with other diabetic kidney complication: Secondary | ICD-10-CM

## 2019-07-25 DIAGNOSIS — I480 Paroxysmal atrial fibrillation: Secondary | ICD-10-CM

## 2019-07-25 DIAGNOSIS — I50813 Acute on chronic right heart failure: Secondary | ICD-10-CM | POA: Diagnosis not present

## 2019-07-25 DIAGNOSIS — IMO0002 Reserved for concepts with insufficient information to code with codable children: Secondary | ICD-10-CM

## 2019-07-25 LAB — COAGUCHEK XS/INR WAIVED
INR: 3.1 — ABNORMAL HIGH (ref 0.9–1.1)
Prothrombin Time: 37.5 s

## 2019-07-25 LAB — BAYER DCA HB A1C WAIVED: HB A1C (BAYER DCA - WAIVED): 13.3 % — ABNORMAL HIGH (ref <7.0)

## 2019-07-25 NOTE — Progress Notes (Signed)
BP 122/75   Pulse 87   Temp 98.3 F (36.8 C)   Ht 5' 4"  (1.626 m)   Wt (!) 259 lb (117.5 kg)   SpO2 96%   BMI 44.46 kg/m    Subjective:   Patient ID: William Flores, male    DOB: Apr 04, 1950, 69 y.o.   MRN: [id number]  HPI: William Flores is a 69 y.o. male presenting on 07/25/2019 for Medical Management of Chronic Issues, Diabetes, and Atrial Fibrillation   HPI Type 2 diabetes mellitus Patient comes in today for recheck of his diabetes. Patient has been currently taking NovoLog and Tresiba 18 and for [employer] and Trulicity, his blood sugars are running in the 180s 300s and his A1c is 13.3.Marland Kitchen Patient is not currently on an ACE inhibitor/ARB. Patient has not seen an ophthalmologist this year. Patient denies any issues with their feet. The symptom started onset as an adult hyperlipidemia and A. fib and CHF ARE RELATED TO DM   Hyperlipidemia Patient is coming in for recheck of his hyperlipidemia. The patient is currently taking atorvastatin. They deny any issues with myalgias or history of liver damage from it. They deny any focal numbness or weakness or chest pain.   A. fib and CHF Patient is coming in for afib and chf and says his breathing is doing well and his weight is doing well.  Patient says the swelling has been down and has been doing really well now.  Relevant past medical, surgical, family and social history reviewed and updated as indicated. Interim medical history since our last visit reviewed. Allergies and medications reviewed and updated.  Review of Systems  Constitutional: Negative for chills and fever.  Respiratory: Negative for shortness of breath (On 2 L nasal cannula during the day and 3-4 at night) and wheezing.   Cardiovascular: Negative for chest pain and leg swelling.  Musculoskeletal: Negative for back pain and gait problem.  Skin: Negative for rash.  Neurological: Negative for dizziness, weakness and light-headedness.  All other systems reviewed and are  negative.   Per HPI unless specifically indicated above   Allergies as of 07/25/2019      Reactions   Codeine    Headache      Medication List       Accurate as of July 25, 2019  2:20 PM. If you have any questions, ask your nurse or doctor.        acetaminophen 325 MG tablet Commonly known as: TYLENOL Take 2 tablets (650 mg total) by mouth every 6 (six) hours as needed for mild pain (or Fever >/= 101).   allopurinol 300 MG tablet Commonly known as: ZYLOPRIM TAKE 1 TABLET BY MOUTH EVERY DAY   atorvastatin 20 MG tablet Commonly known as: LIPITOR 1 tablet daily   azelastine 0.05 % ophthalmic solution Commonly known as: OPTIVAR PLACE 2 DROPS INTO BOTH EYES 2 (TWO) TIMES DAILY.   benzonatate 100 MG capsule Commonly known as: Tessalon Perles Take 1 capsule (100 mg total) by mouth 2 (two) times daily.   BLOOD GLUCOSE TEST STRIPS Strp Test BS TID Dx E11.29   Farxiga 5 MG Tabs tablet Generic drug: dapagliflozin propanediol Take 5 mg by mouth daily.   ferrous sulfate 325 (65 FE) MG tablet Take 325 mg by mouth daily with breakfast.   Insulin Pen Needle 32G X 4 MM Misc Use to give insulin daily Dx E11.29   metoprolol succinate 50 MG 24 hr tablet Commonly known as: TOPROL-XL Take 1 tablet (  50 mg total) by mouth daily. Take with or immediately following a meal.   NovoLOG FlexPen 100 UNIT/ML FlexPen Generic drug: insulin aspart insulin aspart (novoLOG) injection 0-9 Units 0-9 Units, Subcutaneous, 3 times daily with meals, CBG < 70: Implement Hypoglycemia Standing Orders and refer to Hypoglycemia Standing Orders sidebar report CBG 70 - 120: 0 units CBG 121 - 150: 1 unit CBG 151 - 200: 2 units CBG 201 - 250: 3 units CBG 251 - 300: 5 units CBG 301 - 350: 7 units CBG 351 - 400: 9 units CBG > 400 What changed:   how much to take  how to take this  when to take this   polyethylene glycol 17 g packet Commonly known as: MIRALAX / GLYCOLAX Take 17 g by mouth daily.     potassium chloride SA 20 MEQ tablet Commonly known as: [place]-Con M20 Take 1 tablet (20 mEq total) by mouth daily.   sildenafil 20 MG tablet Commonly known as: REVATIO Take 1 tablet (20 mg total) by mouth 3 (three) times daily.   torsemide 20 MG tablet Commonly known as: DEMADEX Take 3 tablets (60 mg total) by mouth in the morning AND 2 tablets (40 mg total) every evening.   Tyler Aas FlexTouch 100 UNIT/ML FlexTouch Pen Generic drug: insulin degludec Inject 0.18 mLs (18 Units total) into the skin at bedtime.   Trulicity 1.5 NL/9.7QB Sopn Generic drug: Dulaglutide INJECT 1.5 MG INTO THE SKIN ONCE A WEEK.   warfarin 5 MG tablet Commonly known as: COUMADIN Take as directed by the anticoagulation clinic. If you are unsure how to take this medication, talk to your nurse or doctor. Original instructions: TAKE 1 TABLET BY MOUTH EVERY DAY IN THE EVENING        Objective:   BP 122/75   Pulse 87   Temp 98.3 F (36.8 C)   Ht 5' 4"  (1.626 m)   Wt (!) 259 lb (117.5 kg)   SpO2 96%   BMI 44.46 kg/m   Wt Readings from Last 3 Encounters:  07/25/19 (!) 259 lb (117.5 kg)  06/13/19 264 lb 9.6 oz (120 kg)  05/28/19 261 lb 8 oz (118.6 kg)    Physical Exam Vitals and nursing note reviewed.  Constitutional:      General: He is not in acute distress.    Appearance: He is well-developed. He is not diaphoretic.     Comments: On 2 L nasal cannula during the day and 3-4 at night  Eyes:     General: No scleral icterus.       Right eye: No discharge.     Conjunctiva/sclera: Conjunctivae normal.     Pupils: Pupils are equal, round, and reactive to light.  Neck:     Thyroid: No thyromegaly.  Cardiovascular:     Rate and Rhythm: Normal rate and regular rhythm.     Heart sounds: Normal heart sounds. No murmur heard.   Pulmonary:     Effort: Pulmonary effort is normal. No respiratory distress.     Breath sounds: Normal breath sounds. No wheezing.  Musculoskeletal:        General: Normal  range of motion.     Cervical back: Neck supple.  Lymphadenopathy:     Cervical: No cervical adenopathy.  Skin:    General: Skin is warm and dry.     Findings: No rash.  Neurological:     Mental Status: He is alert and oriented to person, place, and time.     Coordination:  Coordination normal.  Psychiatric:        Behavior: Behavior normal.       Assessment & Plan:   Problem List Items Addressed This Visit      Cardiovascular and Mediastinum   Right heart failure ([place])   Essential hypertension   Pulmonary hypertension ([place])     Endocrine   DM (diabetes mellitus), type 2, uncontrolled, with renal complications ([place]) - Primary (Chronic)   Relevant Orders   Bayer DCA Hb A1c Waived   CKD stage 3 due to type 2 diabetes mellitus ([place])   Hyperlipidemia associated with type 2 diabetes mellitus ([place])    Other Visit Diagnoses    Paroxysmal atrial fibrillation ([facility])       Relevant Orders   CoaguChek XS/INR Waived    Increase Tresiba to 20 units and set up an appointment with Almyra Free.  Follow up plan: Return if symptoms worsen or fail to improve, for 2-week follow-up with Almyra Free and 75-monthappointment with me..  Counseling provided for all of the vaccine components Orders Placed This Encounter  Procedures  . Bayer DCA Hb A1c Waived  . CoaguChek XS/INR [place] [facility] 07/25/2019, 2:20 PM

## 2019-07-26 ENCOUNTER — Telehealth: Payer: Self-pay | Admitting: Family Medicine

## 2019-07-26 LAB — CBC WITH DIFFERENTIAL/PLATELET
Basophils Absolute: 0.1 10*3/uL (ref 0.0–0.2)
Basos: 1 %
EOS (ABSOLUTE): 0.1 10*3/uL (ref 0.0–0.4)
Eos: 3 %
Hematocrit: 44.4 % (ref 37.5–51.0)
Hemoglobin: 14.8 g/dL (ref 13.0–17.7)
Immature Grans (Abs): 0 10*3/uL (ref 0.0–0.1)
Immature Granulocytes: 0 %
Lymphocytes Absolute: 0.9 10*3/uL (ref 0.7–3.1)
Lymphs: 16 %
MCH: 30.5 pg (ref 26.6–33.0)
MCHC: 33.3 g/dL (ref 31.5–35.7)
MCV: 92 fL (ref 79–97)
Monocytes Absolute: 0.6 10*3/uL (ref 0.1–0.9)
Monocytes: 10 %
Neutrophils Absolute: 4 10*3/uL (ref 1.4–7.0)
Neutrophils: 70 %
Platelets: 101 10*3/uL — ABNORMAL LOW (ref 150–450)
RBC: 4.85 x10E6/uL (ref 4.14–5.80)
RDW: 15.8 % — ABNORMAL HIGH (ref 11.6–15.4)
WBC: 5.7 10*3/uL (ref 3.4–10.8)

## 2019-07-26 LAB — CMP14+EGFR
ALT: 37 IU/L (ref 0–44)
AST: 34 IU/L (ref 0–40)
Albumin/Globulin Ratio: 1.2 (ref 1.2–2.2)
Albumin: 3.6 g/dL — ABNORMAL LOW (ref 3.8–4.8)
Alkaline Phosphatase: 100 IU/L (ref 48–121)
BUN/Creatinine Ratio: 28 — ABNORMAL HIGH (ref 10–24)
BUN: 42 mg/dL — ABNORMAL HIGH (ref 8–27)
Bilirubin Total: 0.4 mg/dL (ref 0.0–1.2)
CO2: 23 mmol/L (ref 20–29)
Calcium: 8.9 mg/dL (ref 8.6–10.2)
Chloride: 104 mmol/L (ref 96–106)
Creatinine, Ser: 1.49 mg/dL — ABNORMAL HIGH (ref 0.76–1.27)
GFR calc Af Amer: 55 mL/min/{1.73_m2} — ABNORMAL LOW (ref >59)
GFR calc non Af Amer: 48 mL/min/{1.73_m2} — ABNORMAL LOW (ref >59)
Globulin, Total: 2.9 g/dL (ref 1.5–4.5)
Glucose: 183 mg/dL — ABNORMAL HIGH (ref 65–99)
Potassium: 4.3 mmol/L (ref 3.5–5.2)
Sodium: 139 mmol/L (ref 134–144)
Total Protein: 6.5 g/dL (ref 6.0–8.5)

## 2019-07-26 LAB — LIPID PANEL
Chol/HDL Ratio: 5 ratio (ref 0.0–5.0)
Cholesterol, Total: 99 mg/dL — ABNORMAL LOW (ref 100–199)
HDL: 20 mg/dL — ABNORMAL LOW (ref >39)
LDL Chol Calc (NIH): 50 mg/dL (ref 0–99)
Triglycerides: 167 mg/dL — ABNORMAL HIGH (ref 0–149)
VLDL Cholesterol Cal: 29 mg/dL (ref 5–40)

## 2019-07-26 NOTE — Telephone Encounter (Signed)
Description   Hold today's dose and then continue to take  7.5 mg on Tuesdays and Saturday and 5 mg the rest of the days  INR was 3.1today (goal is 2-3) Recheck in 4-6 weeks

## 2019-07-28 NOTE — Progress Notes (Signed)
Advanced Heart Failure Clinic Note   PCP: [name], [name], MD HF Cardiology: Dr. Aundra Dubin  HPI: 69 y.o. male with history of chronic atrial fibrillation, chronic anticoagulation therapy w/ coumadin, h/o systolic HF (felt to be tachy-mediated, w/ eventual recovery of LVEF), chronic diastolic CHF, RV failure w/ pulmonary HTN, Diabetes, Obesity, OHS/OSA w/ poor compliance w/ CPAP, h/o AKI, h/o GIB, colon cancer treated w/ resection and h/o MGUS.   Patient was admitted in 02/2014 with hypoxic respiratory failure with acute systolic CHF and possible PNA. He was in atrial fibrillation with RVR and developed AKI. Cardiomyopathy (EF 35-40% by echo in 2016) thought to be tachycardia-mediated at the time. He was admitted again in 4/16 with hypotension and AKI as well as multiple metabolic derangements. He required CVVH. Echo at that time showed EF 60-65%. He was again admitted in 5/16, again with hypoxic respiratory failure and was intubated. EF 60-65% by echo on this admission. He again had AKI and had HD x 1. Renal function again recovered. Also, per records, he had a LHC in 2008 that showed no significant CAD.   Had repeat echo 12/2018 which showed normal LVEF 50-55% w/ moderate LVH, normal LA size, mild MR, severely elevated pulmonary artery systolic pressure, 77 mmHg and moderately reduced right ventricular systolic function. Echo was obtained while admitted for acute anemia 2/2 acute GIB, in the setting of supratherpaeutic INR. INR was > 10.  Hgb 5.1. INR was reversed w/ Kcentra + Vit K. Treated w/ transfusion. Underwent GI w/u. EGD showed chronic gastritis but no clear source of bleeding. Biopsy negative for H Pylori. Colonoscopy showed several polyps, removed and biopsied. No high grade dysplasia. He was placed back on Coumadin. INRs followed by PCP.   Admitted to [facility] for acute on chronic CHF 3/21. Also w/ AKI on admit. SCr was 1.8 (baseline 1.3-1.5). He was started on IV lasix for diuresis,  60 mg bid. Diuresis sluggish and eventually changed to lasix gtt. SCr has worsened w/ attempts at diuresis, w/ SCr rising to 2.3. Echo repeated. LVEF 55-60%. RV severely enlarged w/ severely reduced RV systolic function. Pulmonary artery systolic pressure severely elevated at 76 mmhg. There is mild TR. Also mild to moderate aortic valve sclerosis/calcification is present, without any evidence of aortic stenosis. He was subsequently transferred to [facility] for further management of his HF w/ formal RHC and AHF consultation.  On arrival to [facility], He was placed on high dose IV Lasix w/ better urinary response/ diuresis. RHC 4/8 with elevated filling pressures, mixed pulmonary venous/pulmonary hypertension. V/Q scan negative for chronic PE. He was continued on IV diuretics and diuresed > 20 lb and placed on PO torsemide 40 mg bid.  SCr improved w/ diuresis. Also started on trial of sildenafil 20 mg tid. Advised to continue supplemental home O2 and to restart regular use of CPAP. Also of note, a multiple myeloma panel was done given MGUS and showed elevated M spike protein. Urine immunofixation and UPEP both unremarkable. Discharge wt was 261 lb.   Today he returns for HF follow up.Overall feeling fine. Using 2 liters [place] continuously. Mild SOB with exertion. Denies PND/Orthopnea. No chest pain. No bleeding issues. Denies dizziness.  Appetite ok. No fever or chills. Weight at home  259 pounds. Using CPAP every night. Taking all medications.   ROS: All systems reviewed and negative except as per HPI.   PMH: 1. Chronic atrial fibrillation.  2. Chronic systolic => diastolic CHF with prominent RV failure: LHC 2008 with no  significant CAD but EF 25%.  Echo (2/16) with EF 35-40% (?tachy-mediated CMP).  Echo (5/16) with EF 65-60%, mild MR, mild RV dilation with mildly decreased RV systolic function.  - RHC (4/21): mean RA 16, PA 95/32 mean 53, mean PCWP 23, CI 2.71, PVR 5 WU.  - echo (4/21): EF 55-60%, severe RV dilation and  severe RV dysfunction, PASP 76 mmHg.  - PYP scan (5/21): grade 1, H/CL 0.9 (likely negative).  3. OHS/OSA: Uses CPAP at night, oxygen during the day.  4. Colon cancer s/p resection 5. Type II diabetes 6. CKD stage 3: With episodes of AKI. 7. Hyperlipidemia 8. HTN 9. Pulmonary hypertension: Suspect group 3 PH.  - RHC (4/21): mean RA 16, PA 95/32 mean 53, mean PCWP 23, CI 2.71, PVR 5 WU.  - echo (4/21): EF 55-60%, severe RV dilation and severe RV dysfunction, PASP 76 mmHg.  - V/Q scan 4/21 was negative.  10. H/o GI bleeding.   Current Outpatient Medications  Medication Sig Dispense Refill  . acetaminophen (TYLENOL) 325 MG tablet Take 2 tablets (650 mg total) by mouth every 6 (six) hours as needed for mild pain (or Fever >/= 101). 12 tablet 0  . allopurinol (ZYLOPRIM) 300 MG tablet TAKE 1 TABLET BY MOUTH EVERY DAY 90 tablet 0  . atorvastatin (LIPITOR) 20 MG tablet 1 tablet daily 90 tablet 3  . azelastine (OPTIVAR) 0.05 % ophthalmic solution PLACE 2 DROPS INTO BOTH EYES 2 (TWO) TIMES DAILY. 6 mL 5  . benzonatate (TESSALON PERLES) 100 MG capsule Take 1 capsule (100 mg total) by mouth 2 (two) times daily. 60 capsule 3  . FARXIGA 5 MG TABS tablet Take 5 mg by mouth daily.    . ferrous sulfate 325 (65 FE) MG tablet Take 325 mg by mouth daily with breakfast.     . Glucose Blood (BLOOD GLUCOSE TEST STRIPS) STRP Test BS TID Dx E11.29 300 strip 3  . insulin aspart (NOVOLOG FLEXPEN) 100 UNIT/ML FlexPen insulin aspart (novoLOG) injection 0-9 Units 0-9 Units, Subcutaneous, 3 times daily with meals, CBG < 70: Implement Hypoglycemia Standing Orders and refer to Hypoglycemia Standing Orders sidebar report CBG 70 - 120: 0 units CBG 121 - 150: 1 unit CBG 151 - 200: 2 units CBG 201 - 250: 3 units CBG 251 - 300: 5 units CBG 301 - 350: 7 units CBG 351 - 400: 9 units CBG > 400 (Patient taking differently: Inject 9 Units into the skin in the morning and at bedtime. insulin aspart (novoLOG) injection 0-9 Units 0-9  Units, Subcutaneous, 3 times daily with meals, CBG < 70: Implement Hypoglycemia Standing Orders and refer to Hypoglycemia Standing Orders sidebar report CBG 70 - 120: 0 units CBG 121 - 150: 1 unit CBG 151 - 200: 2 units CBG 201 - 250: 3 units CBG 251 - 300: 5 units CBG 301 - 350: 7 units CBG 351 - 400: 9 units CBG > 400) 15 mL 11  . insulin degludec (TRESIBA FLEXTOUCH) 100 UNIT/ML SOPN FlexTouch Pen Inject 0.18 mLs (18 Units total) into the skin at bedtime. 15 pen 1  . Insulin Pen Needle 32G X 4 MM MISC Use to give insulin daily Dx E11.29 100 each 3  . metoprolol succinate (TOPROL-XL) 50 MG 24 hr tablet Take 1 tablet (50 mg total) by mouth daily. Take with or immediately following a meal. 30 tablet 3  . polyethylene glycol (MIRALAX / GLYCOLAX) 17 g packet Take 17 g by mouth daily. 14 each 0  .  potassium chloride SA (KLOR-CON M20) 20 MEQ tablet Take 1 tablet (20 mEq total) by mouth daily. 30 tablet 3  . sildenafil (REVATIO) 20 MG tablet Take 1 tablet (20 mg total) by mouth 3 (three) times daily. 90 tablet 6  . torsemide (DEMADEX) 20 MG tablet Take 3 tablets (60 mg total) by mouth in the morning AND 2 tablets (40 mg total) every evening. 120 tablet 3  . TRULICITY 1.5 MV/7.8IO SOPN INJECT 1.5 MG INTO THE SKIN ONCE A WEEK. 6 mL 0  . warfarin (COUMADIN) 5 MG tablet TAKE 1 TABLET BY MOUTH EVERY DAY IN THE EVENING 90 tablet 0   No current facility-administered medications for this encounter.    Allergies  Allergen Reactions  . Codeine     Headache       Social History   Socioeconomic History  . Marital status: Divorced    Spouse name: Not on file  . Number of children: 1  . Years of education: 67  . Highest education level: 12th grade  Occupational History    Employer: [employer]  Tobacco Use  . Smoking status: Former Smoker    Quit date: 08/06/1983    Years since quitting: 36.0  . Smokeless tobacco: Never Used  Substance and Sexual Activity  . Alcohol use: No  . Drug use: No    . Sexual activity: Not Currently  Other Topics Concern  . Not on file  Social History Narrative   Lives with daughter    Social Determinants of Health   Financial Resource Strain: Low Risk   . Difficulty of Paying Living Expenses: Not very hard  Food Insecurity: No Food Insecurity  . Worried About Charity fundraiser in the Last Year: Never true  . Ran Out of Food in the Last Year: Never true  Transportation Needs: No Transportation Needs  . Lack of Transportation (Medical): No  . Lack of Transportation (Non-Medical): No  Physical Activity: Inactive  . Days of Exercise per Week: 0 days  . Minutes of Exercise per Session: 0 min  Stress: No Stress Concern Present  . Feeling of Stress : Only a little  Social Connections: Socially Isolated  . Frequency of Communication with Friends and Family: More than three times a week  . Frequency of Social Gatherings with Friends and Family: More than three times a week  . Attends Religious Services: Never  . Active Member of Clubs or Organizations: No  . Attends Archivist Meetings: Never  . Marital Status: Divorced  Human resources officer Violence: Not At Risk  . Fear of Current or Ex-Partner: No  . Emotionally Abused: No  . Physically Abused: No  . Sexually Abused: No      Family History  Problem Relation Age of Onset  . Breast cancer Mother   . Diabetes Mother   . Heart attack Father     Vitals:   07/29/19 1546  BP: (!) 162/104  Pulse: 87  SpO2: 98%  Weight: (!) 116.1 kg (256 lb)   Wt Readings from Last 3 Encounters:  07/29/19 (!) 116.1 kg (256 lb)  07/25/19 (!) 117.5 kg (259 lb)  06/13/19 120 kg (264 lb 9.6 oz)     PHYSICAL EXAM: General:  Well appearing. No resp difficulty HEENT: normal Neck: supple. no JVD. Carotids 2+ bilat; no bruits. No lymphadenopathy or thryomegaly appreciated. Cor: PMI nondisplaced. Irregular rate & rhythm. No rubs, gallops or murmurs. Lungs: clear ob 2 liters [place]  Abdomen: soft,  nontender,  nondistended. No hepatosplenomegaly. No bruits or masses. Good bowel sounds. Extremities: no cyanosis, clubbing, rash, edema Neuro: alert & orientedx3, cranial nerves grossly intact. moves all 4 extremities w/o difficulty. Affect pleasant  ASSESSMENT & PLAN:  1. Cor pulmonale/RV failure: Suspect primarily due to OHS/OSA. Echo in 4/21with EF 55-60%, RV severely dilated w/ severe RV dysfunction in the setting of severe pulmonary hypertension w/ PASP 76 mmHg. Amo 4/21 with elevated filling pressures, mixed pulmonary venous/pulmonary hypertension=> diuresed w/ IV Lasix in hospital and placed on po torsemide.  - NYHA III. Volume status stable. Continue  torsemide to 60 qam/40 qpm.   - Check BMET and in 7 days  2. Pulmonary HTN: PASP elevated at 76 mmHg by 4/21 echo estimate with RV systolic function severely reduced. Most likely poorly controlled OHS/OSA as cause (group 3 PH).  V/Q scan negative for chronic PE.  Mixed PVH/PAH on RHC.   -Continue 20 mg tid.  - Continue home oxygen and CPAP at night.   3. CKD Stage III - CheckBMET  4. Atrial fibrillation: Chronic, unlikely to be able to successfully cardiovert given long-standing atrial fibrillation.  - Rate controlled.  - Continue Toprol XL 50 mg daily.   - Continue warfarin. INRs followed by PCP.  No bleeding issues.  5. OSA/OHS: Continue CPAP at night and oxygen during the day.  6. Diabetes - Management per PCP  7. MGUS: Previously followed by hem/onc but has not seen in several years.  Multiple myeloma panel 4/21 with presence of M-spike. - Will refer back to hem/onc for further w/u. 8. ?PAD: Cramps in thighs with walking, difficult to palpate pedal pulses.  9. HTN : Elevated. Recent admit losartan 100 mg daily was stopped . Today I am going to restart losartan 25 mg daily. Check BMET today and in 7 days.     Follow up in 3 months    Darrick Grinder, NP 07/29/19

## 2019-07-29 ENCOUNTER — Ambulatory Visit (HOSPITAL_COMMUNITY)
Admission: RE | Admit: 2019-07-29 | Discharge: 2019-07-29 | Disposition: A | Discharge status: Home / Self Care | Payer: Medicare Other | Source: Ambulatory Visit | Attending: Cardiology | Admitting: Cardiology

## 2019-07-29 ENCOUNTER — Other Ambulatory Visit: Payer: Self-pay

## 2019-07-29 ENCOUNTER — Encounter (HOSPITAL_COMMUNITY): Payer: Self-pay

## 2019-07-29 VITALS — BP 162/104 | HR 87 | Wt 256.0 lb

## 2019-07-29 DIAGNOSIS — Z7901 Long term (current) use of anticoagulants: Secondary | ICD-10-CM | POA: Insufficient documentation

## 2019-07-29 DIAGNOSIS — I130 Hypertensive heart and chronic kidney disease with heart failure and stage 1 through stage 4 chronic kidney disease, or unspecified chronic kidney disease: Secondary | ICD-10-CM | POA: Diagnosis not present

## 2019-07-29 DIAGNOSIS — E785 Hyperlipidemia, unspecified: Secondary | ICD-10-CM | POA: Insufficient documentation

## 2019-07-29 DIAGNOSIS — Z833 Family history of diabetes mellitus: Secondary | ICD-10-CM | POA: Insufficient documentation

## 2019-07-29 DIAGNOSIS — I4891 Unspecified atrial fibrillation: Secondary | ICD-10-CM

## 2019-07-29 DIAGNOSIS — Z885 Allergy status to narcotic agent status: Secondary | ICD-10-CM | POA: Insufficient documentation

## 2019-07-29 DIAGNOSIS — G4733 Obstructive sleep apnea (adult) (pediatric): Secondary | ICD-10-CM | POA: Insufficient documentation

## 2019-07-29 DIAGNOSIS — I2729 Other secondary pulmonary hypertension: Secondary | ICD-10-CM | POA: Insufficient documentation

## 2019-07-29 DIAGNOSIS — Z85038 Personal history of other malignant neoplasm of large intestine: Secondary | ICD-10-CM | POA: Insufficient documentation

## 2019-07-29 DIAGNOSIS — Z9049 Acquired absence of other specified parts of digestive tract: Secondary | ICD-10-CM | POA: Diagnosis not present

## 2019-07-29 DIAGNOSIS — E1122 Type 2 diabetes mellitus with diabetic chronic kidney disease: Secondary | ICD-10-CM | POA: Diagnosis not present

## 2019-07-29 DIAGNOSIS — K2950 Unspecified chronic gastritis without bleeding: Secondary | ICD-10-CM | POA: Diagnosis not present

## 2019-07-29 DIAGNOSIS — Z9989 Dependence on other enabling machines and devices: Secondary | ICD-10-CM | POA: Diagnosis not present

## 2019-07-29 DIAGNOSIS — I2781 Cor pulmonale (chronic): Secondary | ICD-10-CM | POA: Diagnosis not present

## 2019-07-29 DIAGNOSIS — I739 Peripheral vascular disease, unspecified: Secondary | ICD-10-CM | POA: Insufficient documentation

## 2019-07-29 DIAGNOSIS — Z87891 Personal history of nicotine dependence: Secondary | ICD-10-CM | POA: Insufficient documentation

## 2019-07-29 DIAGNOSIS — E669 Obesity, unspecified: Secondary | ICD-10-CM | POA: Diagnosis not present

## 2019-07-29 DIAGNOSIS — I482 Chronic atrial fibrillation, unspecified: Secondary | ICD-10-CM | POA: Diagnosis not present

## 2019-07-29 DIAGNOSIS — Z79899 Other long term (current) drug therapy: Secondary | ICD-10-CM | POA: Insufficient documentation

## 2019-07-29 DIAGNOSIS — I50812 Chronic right heart failure: Secondary | ICD-10-CM

## 2019-07-29 DIAGNOSIS — I429 Cardiomyopathy, unspecified: Secondary | ICD-10-CM | POA: Insufficient documentation

## 2019-07-29 DIAGNOSIS — I5042 Chronic combined systolic (congestive) and diastolic (congestive) heart failure: Secondary | ICD-10-CM | POA: Insufficient documentation

## 2019-07-29 DIAGNOSIS — N183 Chronic kidney disease, stage 3 unspecified: Secondary | ICD-10-CM | POA: Diagnosis not present

## 2019-07-29 DIAGNOSIS — Z6841 Body Mass Index (BMI) 40.0 and over, adult: Secondary | ICD-10-CM | POA: Diagnosis not present

## 2019-07-29 DIAGNOSIS — Z9981 Dependence on supplemental oxygen: Secondary | ICD-10-CM | POA: Diagnosis not present

## 2019-07-29 DIAGNOSIS — I428 Other cardiomyopathies: Secondary | ICD-10-CM | POA: Diagnosis not present

## 2019-07-29 DIAGNOSIS — D472 Monoclonal gammopathy: Secondary | ICD-10-CM | POA: Insufficient documentation

## 2019-07-29 DIAGNOSIS — Z794 Long term (current) use of insulin: Secondary | ICD-10-CM | POA: Insufficient documentation

## 2019-07-29 DIAGNOSIS — I272 Pulmonary hypertension, unspecified: Secondary | ICD-10-CM

## 2019-07-29 DIAGNOSIS — Z8249 Family history of ischemic heart disease and other diseases of the circulatory system: Secondary | ICD-10-CM | POA: Insufficient documentation

## 2019-07-29 MED ORDER — LOSARTAN POTASSIUM 25 MG PO TABS
25.0000 mg | ORAL_TABLET | Freq: Every day | ORAL | 3 refills | Status: DC
Start: 2019-07-29 — End: 2020-05-07

## 2019-07-29 NOTE — Patient Instructions (Signed)
START Losartan 25 mg, one tab daily  Labs needed in 7-10 days  Your physician recommends that you schedule a follow-up appointment in: 3 months with Dr Aundra Dubin  Do the following things EVERYDAY: 1) Weigh yourself in the morning before breakfast. Write it down and keep it in a log. 2) Take your medicines as prescribed 3) Eat low salt foods--Limit salt (sodium) to 2000 mg per day.  4) Stay as active as you can everyday 5) Limit all fluids for the day to less than 2 liters  If you have any questions or concerns before your next appointment please send [place] a message through [place] or call our office at [phone].    TO LEAVE A MESSAGE FOR THE NURSE SELECT OPTION 2, PLEASE LEAVE A MESSAGE INCLUDING:  YOUR NAME  DATE OF BIRTH  CALL BACK NUMBER  REASON FOR CALL**this is important as we prioritize the call backs  YOU WILL RECEIVE A CALL BACK THE SAME DAY AS LONG AS YOU CALL BEFORE 4:00 PM

## 2019-07-31 ENCOUNTER — Ambulatory Visit (HOSPITAL_COMMUNITY)
Admission: RE | Admit: 2019-07-31 | Payer: Medicare Other | Source: Ambulatory Visit | Attending: Cardiology | Admitting: Cardiology

## 2019-07-31 ENCOUNTER — Telehealth: Payer: Self-pay | Admitting: Family Medicine

## 2019-07-31 NOTE — Telephone Encounter (Signed)
lmtcb

## 2019-08-01 ENCOUNTER — Ambulatory Visit: Payer: Medicare Other | Attending: Internal Medicine

## 2019-08-01 DIAGNOSIS — Z23 Encounter for immunization: Secondary | ICD-10-CM

## 2019-08-01 NOTE — Progress Notes (Signed)
   Covid-19 Vaccination Clinic  Name:  VANESSA ALESI    MRN: [id number] DOB: September 29, 1950  08/01/2019  Mr. Bures was observed post Covid-19 immunization for 15 minutes without incident. He was provided with Vaccine Information Sheet and instruction to access the V-Safe system.   Mr. Smolenski was instructed to call 911 with any severe reactions post vaccine: [place] Kitchen Difficulty breathing  . Swelling of face and throat  . A fast heartbeat  . A bad rash all over body  . Dizziness and weakness   Immunizations Administered    Name Date Dose VIS Date Route   Pfizer COVID-19 Vaccine 08/01/2019  2:07 PM 0.3 mL 02/27/2018 Intramuscular   Manufacturer: [place]   Lot: [id number]   [place]: [id number]

## 2019-08-02 NOTE — Telephone Encounter (Signed)
Refer to previous phone note.

## 2019-08-05 ENCOUNTER — Encounter (HOSPITAL_COMMUNITY): Payer: Self-pay

## 2019-08-08 ENCOUNTER — Telehealth: Payer: Self-pay | Admitting: Family Medicine

## 2019-08-08 NOTE — Telephone Encounter (Signed)
Patient aware to pull off like a band aid

## 2019-08-21 ENCOUNTER — Telehealth (HOSPITAL_COMMUNITY): Payer: Self-pay | Admitting: Pharmacist

## 2019-08-21 MED ORDER — METOPROLOL SUCCINATE ER 50 MG PO TB24: 50.0000 mg | ORAL_TABLET | Freq: Every day | ORAL | 11 refills | Status: DC

## 2019-08-21 NOTE — Telephone Encounter (Signed)
Metoprolol succinate prescription refills sent to [facility].

## 2019-08-22 ENCOUNTER — Ambulatory Visit: Payer: Medicare Other | Attending: Internal Medicine

## 2019-08-22 DIAGNOSIS — Z23 Encounter for immunization: Secondary | ICD-10-CM

## 2019-08-22 NOTE — Progress Notes (Signed)
   Covid-19 Vaccination Clinic  Name:  William Flores    MRN: [id number] DOB: 19-Feb-1950  08/22/2019  [name] was observed post Covid-19 immunization for 30 minutes based on pre-vaccination screening without incident. He was provided with Vaccine Information Sheet and instruction to access the V-Safe system.   [name] was instructed to call 911 with any severe reactions post vaccine: Mar[place] Kitchen Difficulty breathing  . Swelling of face and throat  . A fast heartbeat  . A bad rash all over body  . Dizziness and weakness   Immunizations Administered    Name Date Dose VIS Date Route   Pfizer COVID-19 Vaccine 08/22/2019  2:18 PM 0.3 mL 02/27/2018 Intramuscular   Manufacturer: [place]   Lot: [id number]   [place]: [id number]

## 2019-09-19 ENCOUNTER — Other Ambulatory Visit: Payer: Self-pay | Admitting: Family Medicine

## 2019-09-20 ENCOUNTER — Other Ambulatory Visit (HOSPITAL_COMMUNITY): Payer: Self-pay | Admitting: Cardiology

## 2019-09-20 ENCOUNTER — Telehealth (HOSPITAL_COMMUNITY): Payer: Self-pay | Admitting: Pharmacist

## 2019-09-20 NOTE — Telephone Encounter (Signed)
Entered in error

## 2019-10-11 ENCOUNTER — Other Ambulatory Visit: Payer: Self-pay | Admitting: Family Medicine

## 2019-10-23 ENCOUNTER — Telehealth: Payer: Self-pay

## 2019-10-23 NOTE — Telephone Encounter (Signed)
Do you know anything about this ? I do not see it in the chart

## 2019-10-23 NOTE — Telephone Encounter (Signed)
  Prescription Request  10/23/2019  What is the name of the medication or equipment? NEBULIZER AND MEDICATION TO USE WITH NEBULZER  Have you contacted your pharmacy to request a refill? (if applicable) YES they have faxed order about two weeks ago   Which pharmacy would you like this sent to? [facility] please fax to [phone]    Patient notified that their request is being sent to the clinical staff for review and that they should receive a response within 2 business days.

## 2019-10-24 NOTE — Telephone Encounter (Signed)
LMOVM I have look through my faxed from the last 2 weeks, did not see his, please refax to my direct fax [phone]

## 2019-10-29 ENCOUNTER — Encounter (HOSPITAL_COMMUNITY): Payer: Medicare Other | Admitting: Cardiology

## 2019-11-13 ENCOUNTER — Other Ambulatory Visit: Payer: Self-pay | Admitting: Family Medicine

## 2019-11-14 ENCOUNTER — Telehealth: Payer: Self-pay | Admitting: Family Medicine

## 2019-11-14 MED ORDER — ALLOPURINOL 300 MG PO TABS
300.0000 mg | ORAL_TABLET | Freq: Every day | ORAL | 0 refills | Status: DC
Start: 2019-11-14 — End: 2019-12-20

## 2019-11-14 MED ORDER — POTASSIUM CHLORIDE CRYS ER 20 MEQ PO TBCR
20.0000 meq | EXTENDED_RELEASE_TABLET | Freq: Every day | ORAL | 0 refills | Status: DC
Start: 2019-11-14 — End: 2019-12-20

## 2019-11-14 MED ORDER — WARFARIN SODIUM 5 MG PO TABS
ORAL_TABLET | ORAL | 0 refills | Status: DC
Start: 2019-11-14 — End: 2019-12-09

## 2019-11-14 NOTE — Telephone Encounter (Signed)
LMOVM refills sent to pharmacy

## 2019-11-14 NOTE — Telephone Encounter (Signed)
°  Prescription Request  11/14/2019  What is the name of the medication or equipment? Pt was unsure of medicines  Have you contacted your pharmacy to request a refill? (if applicable)   Which pharmacy would you like this sent to? [facility]  Made appt for pt 12/17   Patient notified that their request is being sent to the clinical staff for review and that they should receive a response within 2 business days.

## 2019-12-01 ENCOUNTER — Other Ambulatory Visit: Payer: Self-pay | Admitting: Family Medicine

## 2019-12-08 ENCOUNTER — Other Ambulatory Visit: Payer: Self-pay | Admitting: Family Medicine

## 2019-12-18 ENCOUNTER — Telehealth: Payer: Self-pay | Admitting: *Deleted

## 2019-12-18 DIAGNOSIS — I4821 Permanent atrial fibrillation: Secondary | ICD-10-CM | POA: Diagnosis not present

## 2019-12-18 DIAGNOSIS — Z7901 Long term (current) use of anticoagulants: Secondary | ICD-10-CM | POA: Diagnosis not present

## 2019-12-18 NOTE — Telephone Encounter (Signed)
Description   Hold today's dose and then continue to take  7.5 mg on Mondays and Saturday and 5 mg the rest of the days  INR was 4.2 today (goal is 2-3) Recheck in [place], MD [place] 12/18/2019, 2:51 PM

## 2019-12-18 NOTE — Telephone Encounter (Signed)
Fax received mdINR PT/INR self testing service Test date/time 12/18/19 133 pm INR 4.2

## 2019-12-18 NOTE — Telephone Encounter (Signed)
Left detailed message on patients voicemail with instructions

## 2019-12-20 ENCOUNTER — Other Ambulatory Visit: Payer: Self-pay

## 2019-12-20 ENCOUNTER — Encounter: Payer: Self-pay | Admitting: Family Medicine

## 2019-12-20 ENCOUNTER — Ambulatory Visit (INDEPENDENT_AMBULATORY_CARE_PROVIDER_SITE_OTHER): Payer: Medicare Other | Admitting: Family Medicine

## 2019-12-20 VITALS — BP 148/76 | HR 79 | Ht 64.0 in | Wt 266.0 lb

## 2019-12-20 DIAGNOSIS — I10 Essential (primary) hypertension: Secondary | ICD-10-CM

## 2019-12-20 DIAGNOSIS — E1165 Type 2 diabetes mellitus with hyperglycemia: Secondary | ICD-10-CM

## 2019-12-20 DIAGNOSIS — I110 Hypertensive heart disease with heart failure: Secondary | ICD-10-CM

## 2019-12-20 DIAGNOSIS — N183 Chronic kidney disease, stage 3 unspecified: Secondary | ICD-10-CM

## 2019-12-20 DIAGNOSIS — E1122 Type 2 diabetes mellitus with diabetic chronic kidney disease: Secondary | ICD-10-CM

## 2019-12-20 DIAGNOSIS — E1129 Type 2 diabetes mellitus with other diabetic kidney complication: Secondary | ICD-10-CM | POA: Diagnosis not present

## 2019-12-20 DIAGNOSIS — E785 Hyperlipidemia, unspecified: Secondary | ICD-10-CM

## 2019-12-20 DIAGNOSIS — Z23 Encounter for immunization: Secondary | ICD-10-CM | POA: Diagnosis not present

## 2019-12-20 DIAGNOSIS — I480 Paroxysmal atrial fibrillation: Secondary | ICD-10-CM

## 2019-12-20 DIAGNOSIS — E1169 Type 2 diabetes mellitus with other specified complication: Secondary | ICD-10-CM

## 2019-12-20 DIAGNOSIS — I5082 Biventricular heart failure: Secondary | ICD-10-CM

## 2019-12-20 DIAGNOSIS — IMO0002 Reserved for concepts with insufficient information to code with codable children: Secondary | ICD-10-CM

## 2019-12-20 LAB — COAGUCHEK XS/INR WAIVED
INR: 2.8 — ABNORMAL HIGH (ref 0.9–1.1)
Prothrombin Time: 33.7 s

## 2019-12-20 LAB — BAYER DCA HB A1C WAIVED: HB A1C (BAYER DCA - WAIVED): 9.9 % — ABNORMAL HIGH (ref <7.0)

## 2019-12-20 MED ORDER — TRESIBA FLEXTOUCH 100 UNIT/ML ~~LOC~~ SOPN: 18.0000 [IU] | PEN_INJECTOR | Freq: Every day | SUBCUTANEOUS | 3 refills | Status: DC

## 2019-12-20 MED ORDER — BENZONATATE 200 MG PO CAPS
200.0000 mg | ORAL_CAPSULE | Freq: Two times a day (BID) | ORAL | 3 refills | Status: DC
Start: 2019-12-20 — End: 2020-06-20

## 2019-12-20 MED ORDER — WARFARIN SODIUM 5 MG PO TABS: 5.0000 mg | ORAL_TABLET | Freq: Every evening | ORAL | 5 refills | Status: DC

## 2019-12-20 MED ORDER — POTASSIUM CHLORIDE CRYS ER 20 MEQ PO TBCR: 20.0000 meq | EXTENDED_RELEASE_TABLET | Freq: Every day | ORAL | 3 refills | Status: DC

## 2019-12-20 MED ORDER — NOVOLOG FLEXPEN 100 UNIT/ML ~~LOC~~ SOPN: PEN_INJECTOR | SUBCUTANEOUS | 11 refills | Status: DC

## 2019-12-20 MED ORDER — TRULICITY 1.5 MG/0.5ML ~~LOC~~ SOAJ: 1.5000 mg | SUBCUTANEOUS | 3 refills | Status: DC

## 2019-12-20 MED ORDER — ALLOPURINOL 300 MG PO TABS
300.0000 mg | ORAL_TABLET | Freq: Every day | ORAL | 3 refills | Status: DC
Start: 2019-12-20 — End: 2021-01-25

## 2019-12-20 NOTE — Progress Notes (Signed)
BP (!) 148/76   Pulse 79   Ht 5' 4"  (1.626 m)   Wt 266 lb (120.7 kg)   SpO2 99%   BMI 45.66 kg/m    Subjective:   Patient ID: William Flores, male    DOB: 1950-12-26, 69 y.o.   MRN: [id number]  HPI: William Flores is a 69 y.o. male presenting on 12/20/2019 for Medical Management of Chronic Issues, Atrial Fibrillation, and Diabetes   HPI Type 2 diabetes mellitus Patient comes in today for recheck of his diabetes. Patient has been currently taking NovoLog and [place] and [place] and [place]. Patient is currently on an ACE inhibitor/ARB. Patient has seen an ophthalmologist this year. Patient denies any issues with their feet. The symptom started onset as an adult hypertension and hyperlipidemia ARE RELATED TO DM   Hypertension Patient is currently on metoprolol and torsemide and losartan, and their blood pressure today is 148/76. Patient denies any lightheadedness or dizziness. Patient denies headaches, blurred vision, chest pains, shortness of breath, or weakness. Denies any side effects from medication and is content with current medication.   Hyperlipidemia Patient is coming in for recheck of his hyperlipidemia. The patient is currently taking atorvastatin. They deny any issues with myalgias or history of liver damage from it. They deny any focal numbness or weakness or chest pain.   Coumadin recheck Target goal: 2.0-3.0 Reason on anticoagulation: A. fib Patient denies any bruising or bleeding or chest pain or palpitations   Relevant past medical, surgical, family and social history reviewed and updated as indicated. Interim medical history since our last visit reviewed. Allergies and medications reviewed and updated.  Review of Systems  Constitutional: Negative for chills and fever.  Respiratory: Negative for shortness of breath and wheezing.   Cardiovascular: Positive for leg swelling. Negative for chest pain.  Musculoskeletal: Negative for back pain and gait problem.   Skin: Negative for rash.  Neurological: Negative for dizziness, weakness and numbness.  All other systems reviewed and are negative.   Per HPI unless specifically indicated above   Allergies as of 12/20/2019      Reactions   Codeine    Headache      Medication List       Accurate as of December 20, 2019  3:47 PM. If you have any questions, ask your nurse or doctor.        acetaminophen 325 MG tablet Commonly known as: TYLENOL Take 2 tablets (650 mg total) by mouth every 6 (six) hours as needed for mild pain (or Fever >/= 101).   allopurinol 300 MG tablet Commonly known as: ZYLOPRIM Take 1 tablet (300 mg total) by mouth daily.   atorvastatin 20 MG tablet Commonly known as: LIPITOR 1 tablet daily   azelastine 0.05 % ophthalmic solution Commonly known as: OPTIVAR PLACE 2 DROPS INTO BOTH EYES 2 (TWO) TIMES DAILY.   benzonatate 100 MG capsule Commonly known as: Tessalon Perles Take 1 capsule (100 mg total) by mouth 2 (two) times daily.   BLOOD GLUCOSE TEST STRIPS Strp Test BS TID Dx E11.29   Farxiga 5 MG Tabs tablet Generic drug: dapagliflozin propanediol Take 5 mg by mouth daily.   ferrous sulfate 325 (65 FE) MG tablet Take 325 mg by mouth daily with breakfast.   Insulin Pen Needle 32G X 4 MM Misc Use to give insulin daily Dx E11.29   losartan 25 MG tablet Commonly known as: COZAAR Take 1 tablet (25 mg total) by mouth daily.   metoprolol  succinate 50 MG 24 hr tablet Commonly known as: TOPROL-XL Take 1 tablet (50 mg total) by mouth daily. Take with or immediately following a meal.   NovoLOG FlexPen 100 UNIT/ML FlexPen Generic drug: insulin aspart insulin aspart (novoLOG) injection 0-9 Units 0-9 Units, Subcutaneous, 3 times daily with meals, CBG < 70: Implement Hypoglycemia Standing Orders and refer to Hypoglycemia Standing Orders sidebar report CBG 70 - 120: 0 units CBG 121 - 150: 1 unit CBG 151 - 200: 2 units CBG 201 - 250: 3 units CBG 251 - 300: 5 units  CBG 301 - 350: 7 units CBG 351 - 400: 9 units CBG > 400 What changed:   how much to take  how to take this  when to take this   polyethylene glycol 17 g packet Commonly known as: MIRALAX / GLYCOLAX Take 17 g by mouth daily.   potassium chloride SA 20 MEQ tablet Commonly known as: Klor-Con M20 Take 1 tablet (20 mEq total) by mouth daily.   sildenafil 20 MG tablet Commonly known as: REVATIO Take 1 tablet (20 mg total) by mouth 3 (three) times daily.   torsemide 20 MG tablet Commonly known as: DEMADEX TAKE 3 TABLETS (60 MG TOTAL) BY MOUTH IN THE MORNING AND 2 TABLETS (40 MG TOTAL) EVERY EVENING.   Tyler Aas FlexTouch 100 UNIT/ML FlexTouch Pen Generic drug: insulin degludec Inject 0.18 mLs (18 Units total) into the skin at bedtime.   Trulicity 1.5 MW/4.1LK Sopn Generic drug: Dulaglutide INJECT 1.5 MG INTO THE SKIN ONCE A WEEK.   warfarin 5 MG tablet Commonly known as: COUMADIN Take as directed by the anticoagulation clinic. If you are unsure how to take this medication, talk to your nurse or doctor. Original instructions: TAKE 1 TABLET BY MOUTH EVERY DAY IN THE EVENING        Objective:   BP (!) 148/76   Pulse 79   Ht 5' 4"  (1.626 m)   Wt 266 lb (120.7 kg)   SpO2 99%   BMI 45.66 kg/m   Wt Readings from Last 3 Encounters:  12/20/19 266 lb (120.7 kg)  07/29/19 (!) 256 lb (116.1 kg)  07/25/19 (!) 259 lb (117.5 kg)    Physical Exam Vitals and nursing note reviewed.  Constitutional:      General: He is not in acute distress.    Appearance: He is well-developed and well-nourished. He is not diaphoretic.  Eyes:     General: No scleral icterus.       Right eye: No discharge.     Extraocular Movements: EOM normal.     Conjunctiva/sclera: Conjunctivae normal.     Pupils: Pupils are equal, round, and reactive to light.  Neck:     Thyroid: No thyromegaly.  Cardiovascular:     Rate and Rhythm: Normal rate and regular rhythm.     Pulses: Intact distal pulses.      Heart sounds: Normal heart sounds. No murmur heard.   Pulmonary:     Effort: Pulmonary effort is normal. No respiratory distress.     Breath sounds: Normal breath sounds. No wheezing.  Musculoskeletal:        General: Swelling (2+edema b/l) present. No edema. Normal range of motion.     Cervical back: Neck supple.  Lymphadenopathy:     Cervical: No cervical adenopathy.  Skin:    General: Skin is warm and dry.     Findings: No rash.  Neurological:     Mental Status: He is alert and oriented to  person, place, and time.     Coordination: Coordination normal.  Psychiatric:        Mood and Affect: Mood and affect normal.        Behavior: Behavior normal.     Assessment & Plan:   Problem List Items Addressed This Visit      Cardiovascular and Mediastinum   Hypertensive heart disease (Chronic)   Relevant Orders   CMP14+EGFR   Essential hypertension     Endocrine   DM (diabetes mellitus), type 2, uncontrolled, with renal complications (HCC) (Chronic)   Relevant Orders   CMP14+EGFR   Bayer DCA Hb A1c Waived   CKD stage 3 due to type 2 diabetes mellitus (HCC)   Relevant Orders   Bayer DCA Hb A1c Waived   Hyperlipidemia associated with type 2 diabetes mellitus ([place])   Relevant Orders   Lipid panel    Other Visit Diagnoses    Paroxysmal atrial fibrillation ([place])    -  Primary   Relevant Orders   CoaguChek XS/INR Waived (Completed)   CBC with Differential/Platelet   Need for prophylactic vaccination against Streptococcus pneumoniae (pneumococcus)       Relevant Orders   Pneumococcal polysaccharide vaccine 23-valent greater than or equal to 2yo subcutaneous/IM (Completed)   Need for immunization against influenza       Relevant Orders   Flu Vaccine QUAD High Dose(Fluad) (Completed)      Will check A1c and blood work, sounds like his sugars are running better but we will see where the A1c is.  Description   continue to take  7.5 mg on Mondays and Saturday and 5 mg the  rest of the days  INR was 2.8 today (goal is 2-3) Recheck in 1-2weeks      Follow up plan: Return in about 3 months (around 03/19/2020), or if symptoms worsen or fail to improve, for  hypertension and diabetes.  Counseling provided for all of the vaccine components Orders Placed This Encounter  Procedures  . Pneumococcal polysaccharide vaccine 23-valent greater than or equal to 2yo subcutaneous/IM  . Flu Vaccine QUAD High Dose(Fluad)  . CoaguChek XS/INR [place], MD [facility] 12/20/2019, 3:47 PM

## 2019-12-20 NOTE — Addendum Note (Signed)
Addended by: Caryl Pina on: 12/20/2019 04:14 PM   Modules accepted: Orders

## 2019-12-21 LAB — CBC WITH DIFFERENTIAL/PLATELET
Basophils Absolute: 0.1 10*3/uL (ref 0.0–0.2)
Basos: 1 %
EOS (ABSOLUTE): 0.1 10*3/uL (ref 0.0–0.4)
Eos: 2 %
Hematocrit: 41.3 % (ref 37.5–51.0)
Hemoglobin: 13.5 g/dL (ref 13.0–17.7)
Immature Grans (Abs): 0 10*3/uL (ref 0.0–0.1)
Immature Granulocytes: 0 %
Lymphocytes Absolute: 0.7 10*3/uL (ref 0.7–3.1)
Lymphs: 14 %
MCH: 30.4 pg (ref 26.6–33.0)
MCHC: 32.7 g/dL (ref 31.5–35.7)
MCV: 93 fL (ref 79–97)
Monocytes Absolute: 0.5 10*3/uL (ref 0.1–0.9)
Monocytes: 9 %
Neutrophils Absolute: 3.8 10*3/uL (ref 1.4–7.0)
Neutrophils: 74 %
Platelets: 100 10*3/uL — CL (ref 150–450)
RBC: 4.44 x10E6/uL (ref 4.14–5.80)
RDW: 15.2 % (ref 11.6–15.4)
WBC: 5.2 10*3/uL (ref 3.4–10.8)

## 2019-12-21 LAB — CMP14+EGFR
ALT: 46 IU/L — ABNORMAL HIGH (ref 0–44)
AST: 33 IU/L (ref 0–40)
Albumin/Globulin Ratio: 1.4 (ref 1.2–2.2)
Albumin: 4 g/dL (ref 3.8–4.8)
Alkaline Phosphatase: 100 IU/L (ref 44–121)
BUN/Creatinine Ratio: 33 — ABNORMAL HIGH (ref 10–24)
BUN: 56 mg/dL — ABNORMAL HIGH (ref 8–27)
Bilirubin Total: 0.4 mg/dL (ref 0.0–1.2)
CO2: 21 mmol/L (ref 20–29)
Calcium: 9.2 mg/dL (ref 8.6–10.2)
Chloride: 103 mmol/L (ref 96–106)
Creatinine, Ser: 1.69 mg/dL — ABNORMAL HIGH (ref 0.76–1.27)
GFR calc Af Amer: 48 mL/min/{1.73_m2} — ABNORMAL LOW (ref >59)
GFR calc non Af Amer: 41 mL/min/{1.73_m2} — ABNORMAL LOW (ref >59)
Globulin, Total: 2.8 g/dL (ref 1.5–4.5)
Glucose: 254 mg/dL — ABNORMAL HIGH (ref 65–99)
Potassium: 4 mmol/L (ref 3.5–5.2)
Sodium: 143 mmol/L (ref 134–144)
Total Protein: 6.8 g/dL (ref 6.0–8.5)

## 2019-12-21 LAB — LIPID PANEL
Chol/HDL Ratio: 4.5 ratio (ref 0.0–5.0)
Cholesterol, Total: 100 mg/dL (ref 100–199)
HDL: 22 mg/dL — ABNORMAL LOW (ref >39)
LDL Chol Calc (NIH): 53 mg/dL (ref 0–99)
Triglycerides: 143 mg/dL (ref 0–149)
VLDL Cholesterol Cal: 25 mg/dL (ref 5–40)

## 2019-12-25 ENCOUNTER — Telehealth: Payer: Self-pay

## 2019-12-25 NOTE — Telephone Encounter (Signed)
Please direct him to [facility].  I think they are about $10 with those coupons.

## 2019-12-25 NOTE — Telephone Encounter (Signed)
Pt states that Dr [name] called him in some Ladona Ridgel and says his insurance won't cover them. Was told he would have to pay over $100 for them.  Wants to know if something else can be called in for him.  Please advise and call patient.

## 2019-12-30 ENCOUNTER — Other Ambulatory Visit: Payer: Self-pay | Admitting: Family Medicine

## 2020-01-01 LAB — PROTIME-INR: INR: 3.2 — AB (ref 0.9–1.1)

## 2020-01-08 ENCOUNTER — Other Ambulatory Visit: Payer: Self-pay

## 2020-01-08 ENCOUNTER — Ambulatory Visit: Payer: Medicare Other

## 2020-01-10 ENCOUNTER — Telehealth: Payer: Self-pay | Admitting: *Deleted

## 2020-01-10 NOTE — Telephone Encounter (Signed)
Description   Hold today's dose (01/10/20) then decrease to   7.5 mg on Mondays and 5 mg the rest of the days  INR was  3.3 today (goal is 2-3) Recheck in 1-2weeks

## 2020-01-10 NOTE — Telephone Encounter (Signed)
Fax received mdINR PT/INR self testing service Test date/time 01/09/20 750 pm INR 3.3

## 2020-01-13 ENCOUNTER — Telehealth: Payer: Self-pay

## 2020-01-16 ENCOUNTER — Telehealth: Payer: Self-pay | Admitting: *Deleted

## 2020-01-16 NOTE — Telephone Encounter (Signed)
Fax received mdINR PT/INR self testing service Test date/time 01/16/20 1129 am INR 1.8

## 2020-01-16 NOTE — Telephone Encounter (Signed)
Patient aware and verbalizes understanding. 

## 2020-01-16 NOTE — Telephone Encounter (Signed)
Description   continue to take  7.5 mg on Mondays and 5 mg the rest of the days  INR was  1.8 today (goal is 2-3) Recheck in [place], MD [place] 01/16/2020, 3:34 PM

## 2020-01-22 ENCOUNTER — Telehealth: Payer: Self-pay

## 2020-01-22 ENCOUNTER — Telehealth: Payer: Self-pay | Admitting: *Deleted

## 2020-01-22 NOTE — Telephone Encounter (Signed)
I am fine with him coming in to get it checked in person but this may be something you want to run by [name] as well just to make sure because sometimes those home health services may be lost or lapsed if they come in and do 1 in person, asked [name] first and if she says it is okay then have him come in and do a visit for this.

## 2020-01-22 NOTE — Chronic Care Management (AMB) (Signed)
  Chronic Care Management   Note  01/22/2020 Name: William Flores MRN: [id number] DOB: 28-Apr-1950  William Flores is a 70 y.o. year old male who is a primary care patient of [name], [name], MD. I reached out to [name] by phone today in response to a referral sent by [name]'s health plan.     [name] was given information about Chronic Care Management services today including:  1. CCM service includes personalized support from designated clinical staff supervised by his physician, including individualized plan of care and coordination with other care providers 2. 24/7 contact phone numbers for assistance for urgent and routine care needs. 3. Service will only be billed when office clinical staff spend 20 minutes or more in a month to coordinate care. 4. Only one practitioner may furnish and bill the service in a calendar month. 5. The patient may stop CCM services at any time (effective at the end of the month) by phone call to the office staff. 6. The patient will be responsible for cost sharing (co-pay) of up to 20% of the service fee (after annual deductible is met).  Patient agreed to services and verbal consent obtained.   Follow up plan: Telephone appointment with care management team member scheduled for:02/07/2020  [place] [facility]

## 2020-01-22 NOTE — Chronic Care Management (AMB) (Signed)
  Chronic Care Management   Outreach Note  01/22/2020 Name: William Flores MRN: [id number] DOB: [birth year]  William Flores is a 70 y.o. year old male who is a primary care patient of [name], [name], MD. I reached out to [name] by phone today in response to a referral sent by [name]'s health plan.     An unsuccessful telephone outreach was attempted today. The patient was referred to the case management team for assistance with care management and care coordination.   Follow Up Plan: A HIPAA compliant phone message was left for the patient providing contact information and requesting a return call. The care management team will reach out to the patient again over the next 7 days. If patient returns call to provider office, please advise to call [place] at [phone]. [place] [facility]

## 2020-01-23 ENCOUNTER — Telehealth: Payer: Self-pay | Admitting: *Deleted

## 2020-01-23 NOTE — Telephone Encounter (Signed)
Fax received mdINR PT/INR self testing service Test date/time 01/22/20 210 pm INR 2.0   Talked with patient before I got this fax result today, he did not have test strip in correctly / not have test strips, but was then able to perform his INR yesterday afternoon. Christy with [employer] is also going to reach out to patient about his machine before we bring him in to do any side-by-side testing

## 2020-01-23 NOTE — Telephone Encounter (Signed)
Pt was out of strips for his machine and was able to test yesterday 01/22/20. [employer] rep Alyse Low will be giving him a call to go over his machine with him as well.

## 2020-01-23 NOTE — Telephone Encounter (Signed)
Description   continue to take  7.5 mg on Mondays and 5 mg the rest of the days  INR was  2.0 today (goal is 2-3) Recheck in [place], MD [place] 01/23/2020, 4:51 PM

## 2020-01-24 NOTE — Telephone Encounter (Signed)
Left message informing pt of INR results and how to take his medication. Asked pt to call office with any concerns.

## 2020-02-04 ENCOUNTER — Telehealth: Payer: Self-pay

## 2020-02-05 ENCOUNTER — Ambulatory Visit (INDEPENDENT_AMBULATORY_CARE_PROVIDER_SITE_OTHER): Payer: Medicare Other

## 2020-02-05 ENCOUNTER — Encounter: Payer: Self-pay | Admitting: *Deleted

## 2020-02-05 ENCOUNTER — Telehealth: Payer: Self-pay | Admitting: *Deleted

## 2020-02-05 ENCOUNTER — Other Ambulatory Visit: Payer: Self-pay

## 2020-02-05 DIAGNOSIS — Z7901 Long term (current) use of anticoagulants: Secondary | ICD-10-CM | POA: Diagnosis not present

## 2020-02-05 DIAGNOSIS — I4821 Permanent atrial fibrillation: Secondary | ICD-10-CM | POA: Diagnosis not present

## 2020-02-05 DIAGNOSIS — Z23 Encounter for immunization: Secondary | ICD-10-CM | POA: Diagnosis not present

## 2020-02-05 NOTE — Progress Notes (Signed)
   Covid-19 Vaccination Clinic  Name:  William Flores    MRN: [id number] DOB: 03-Jun-1950  02/05/2020  [name] was observed post Covid-19 immunization for 15 minutes without incident. He was provided with Vaccine Information Sheet and instruction to access the V-Safe system.   [name] was instructed to call 911 with any severe reactions post vaccine: Marland Kitchen Difficulty breathing  . Swelling of face and throat  . A fast heartbeat  . A bad rash all over body  . Dizziness and weakness   Immunizations Administered    Name Date Dose VIS Date Route   Pfizer COVID-19 Vaccine 02/05/2020 10:56 AM 0.3 mL 10/23/2019 Intramuscular   Manufacturer: [employer]   Lot: [id number]   [place]: [address]-3

## 2020-02-05 NOTE — Telephone Encounter (Signed)
Order pulled and placed on providers desk

## 2020-02-05 NOTE — Telephone Encounter (Signed)
Fax received mdINR PT/INR self testing service Test date/time 02/05/20 247 pm INR 2.4

## 2020-02-06 NOTE — Telephone Encounter (Signed)
Description   continue to take  7.5 mg on Mondays and 5 mg the rest of the days  INR was  2.4 today (goal is 2-3) Recheck in [place], MD [place] 02/06/2020, 12:29 PM

## 2020-02-06 NOTE — Telephone Encounter (Signed)
Left message informing pt of results and instructions on how to continue Coumadin.

## 2020-02-07 ENCOUNTER — Telehealth: Payer: Self-pay | Admitting: *Deleted

## 2020-02-07 ENCOUNTER — Telehealth: Payer: Medicare Other | Admitting: *Deleted

## 2020-02-07 NOTE — Telephone Encounter (Signed)
  Chronic Care Management   Outreach Note  02/07/2020 Name: William Flores MRN: [id number] DOB: 03/01/1950  Referred by: [name], [name], MD Reason for referral : Chronic Care Management (Initial Visit)   An unsuccessful Initial Telephone Visit was attempted today. The patient was referred to the case management team for assistance with care management and care coordination.   Clinical Goals: . Over the next 30 days, patient will be contacted by a Care Guide to reschedule their CCM Visit  Interventions and Plan . Chart reviewed in preparation for telephone visit . Collaboration with other care team members as needed . A HIPAA compliant phone message was left for the patient providing contact information and requesting a return call.  . Request sent to care guides to reach out and reschedule patient's telephone visit   Chong Sicilian, BSN, RN-BC [place] [facility] Direct Dial: [phone]

## 2020-02-12 NOTE — Telephone Encounter (Signed)
Closing encounter this has been faxed

## 2020-02-12 NOTE — Telephone Encounter (Signed)
R/s

## 2020-02-14 ENCOUNTER — Telehealth: Payer: Self-pay | Admitting: *Deleted

## 2020-02-14 NOTE — Telephone Encounter (Signed)
Fax received mdINR PT/INR self testing service Test date/time 02/05/20 247 pm INR 2.4  Fax was rcvd on 02/05/20, letter was written on this day to change fax number results are sent to so they are sent to they home health fax number. This has been addressed. Closing encounter

## 2020-02-17 ENCOUNTER — Telehealth: Payer: Self-pay | Admitting: *Deleted

## 2020-02-17 NOTE — Telephone Encounter (Signed)
Lmtcb.

## 2020-02-17 NOTE — Telephone Encounter (Signed)
Description   continue to take  7.5 mg on Mondays and 5 mg the rest of the days  INR was  2.4 today (goal is 2-3) Recheck in [place], MD [place] 02/17/2020, 12:53 PM

## 2020-02-17 NOTE — Telephone Encounter (Signed)
Fax received mdINR PT/INR self testing service Test date/time 02/14/20 606 pm INR 2.4

## 2020-02-17 NOTE — Chronic Care Management (AMB) (Signed)
  Care Management   Note  02/17/2020 Name: [name] MRN: [id number] DOB: Nov 20, 1950  William Flores is a 70 y.o. year old male who is a primary care patient of [name], [name], MD and is actively engaged with the care management team. I reached out to [name] by phone today to assist with re-scheduling a follow up visit with the RN Case Manager.  Follow up plan: Unsuccessful telephone outreach attempt made. A HIPAA compliant phone message was left for the patient providing contact information and requesting a return call.  The care management team will reach out to the patient again over the next 7 days.  If patient returns call to provider office, please advise to call [name] at [phone].  [place] [facility]

## 2020-02-19 NOTE — Telephone Encounter (Signed)
Patient notified and expressed understanding.

## 2020-02-21 ENCOUNTER — Other Ambulatory Visit: Payer: Self-pay | Admitting: Family Medicine

## 2020-02-24 ENCOUNTER — Telehealth: Payer: Self-pay | Admitting: *Deleted

## 2020-02-24 NOTE — Telephone Encounter (Signed)
Fax received mdINR PT/INR self testing service Test date/time 02/23/20 136 pm INR 2.0

## 2020-02-25 NOTE — Telephone Encounter (Signed)
William Flores spoke with patient William Flores for 03/06/2020

## 2020-02-25 NOTE — Chronic Care Management (AMB) (Signed)
  Care Management   Note  02/25/2020 Name: William Flores MRN: [id number] DOB: 1950/11/04  William Flores is a 70 y.o. year old male who is a primary care patient of [name], [name], MD and is actively engaged with the care management team. I reached out to [name] by phone today to assist with re-scheduling a follow up visit with the RN Case Manager  Follow up plan: Telephone appointment with care management team member scheduled for:03/06/2020  [place] [facility]

## 2020-02-26 NOTE — Telephone Encounter (Signed)
Description   continue to take  7.5 mg on Mondays and 5 mg the rest of the days  INR was  2.0 today (goal is 2-3) Recheck in [place], MD [place] 02/26/2020, 8:04 AM

## 2020-02-26 NOTE — Telephone Encounter (Signed)
Pt aware by detailed VM

## 2020-03-02 ENCOUNTER — Telehealth: Payer: Self-pay | Admitting: *Deleted

## 2020-03-02 NOTE — Telephone Encounter (Signed)
Fax received mdINR PT/INR self testing service Test date/time 03/02/20 1031 am INR 2.4

## 2020-03-02 NOTE — Telephone Encounter (Signed)
Left message informing patient of Coumadin instructions. Asked to return call if has any concerns.

## 2020-03-02 NOTE — Telephone Encounter (Signed)
Description   continue to take  7.5 mg on Mondays and 5 mg the rest of the days  INR was  2.4 today (goal is 2-3) Recheck in [place], MD [place] 03/02/2020, 11:33 AM

## 2020-03-06 ENCOUNTER — Telehealth: Payer: Self-pay | Admitting: *Deleted

## 2020-03-06 ENCOUNTER — Telehealth: Payer: Medicare Other | Admitting: *Deleted

## 2020-03-06 NOTE — Telephone Encounter (Signed)
  Chronic Care Management   Outreach Note  03/06/2020 Name: William Flores MRN: [id number] DOB: 08-15-1950  Referred by: [name], [name], MD Reason for referral : Chronic Care Management (Unsuccessful outreach)   A second unsuccessful follow-up Telephone Visit was attempted today. The patient was referred to the case management team for assistance with care management and care coordination.   Clinical Goals: . Over the next 30 days, patient will be contacted by a Care Guide to reschedule their CCM Visit  Interventions and Plan . Chart reviewed in preparation for telephone visit . Collaboration with other care team members as needed . A HIPAA compliant phone message was left for the patient providing contact information and requesting a return call.  . Request sent to care guides to reach out and reschedule patient's telephone visit   Chong Sicilian, BSN, RN-BC [place] [facility] [facility] Direct Dial: [phone]

## 2020-03-09 NOTE — Telephone Encounter (Signed)
Patient has been rescheduled.

## 2020-03-11 ENCOUNTER — Telehealth: Payer: Self-pay | Admitting: *Deleted

## 2020-03-11 DIAGNOSIS — Z7901 Long term (current) use of anticoagulants: Secondary | ICD-10-CM | POA: Diagnosis not present

## 2020-03-11 DIAGNOSIS — I4821 Permanent atrial fibrillation: Secondary | ICD-10-CM | POA: Diagnosis not present

## 2020-03-11 NOTE — Telephone Encounter (Signed)
Fax received mdINR PT/INR self testing service Test date/time 03/11/20 1205 pm INR 2.0

## 2020-03-12 NOTE — Telephone Encounter (Signed)
Left message to call back  

## 2020-03-12 NOTE — Telephone Encounter (Signed)
Description   continue to take  7.5 mg on Mondays and 5 mg the rest of the days  INR was  2.0 today (goal is 2-3) Recheck in [place], MD [place] 03/12/2020, 7:46 AM

## 2020-03-13 NOTE — Telephone Encounter (Signed)
Lmtcb.

## 2020-03-13 NOTE — Telephone Encounter (Signed)
rc for nurse 

## 2020-03-16 ENCOUNTER — Ambulatory Visit: Payer: Medicare Other | Admitting: *Deleted

## 2020-03-16 DIAGNOSIS — E1165 Type 2 diabetes mellitus with hyperglycemia: Secondary | ICD-10-CM

## 2020-03-16 DIAGNOSIS — I480 Paroxysmal atrial fibrillation: Secondary | ICD-10-CM

## 2020-03-16 DIAGNOSIS — IMO0002 Reserved for concepts with insufficient information to code with codable children: Secondary | ICD-10-CM

## 2020-03-16 NOTE — Patient Instructions (Signed)
  Plan:  . The patient has been provided with contact information for the care management team and has been advised to call if they would like to re-enroll in the CCM program to receive care management and care coordination services.  . CCM enrollment status changed to "previously enrolled" as per patient request on 03/16/2020  to discontinue enrollment. Case closed to case management services in primary care home.  . Patient was provided with general disease management education and encouraged to follow-up with their PCP and specialists as recommended.   Chong Sicilian, BSN, RN-BC Embedded Chronic Care Manager [facility] / [facility] Direct Dial: [phone]

## 2020-03-16 NOTE — Chronic Care Management (AMB) (Signed)
  Chronic Care Management   Initial Visit Note  03/16/2020 Name: William Flores MRN: [id number] DOB: 1950/08/27  Referred by: [name], [name], MD Reason for referral : Chronic Care Management (RN follow up)   William Flores is a 70 y.o. year old male who is a primary care patient of [name], [name], MD. The CCM team was consulted for assistance with chronic disease management and care coordination needs related to Atrial Fibrillation, HTN, HLD and DM  Review of patient status, including review of consultants reports, relevant laboratory and other test results was performed prior to outreach.   I spoke with William Flores by telephone today regarding management of their chronic medical conditions. They do not have any CCM or resource needs and feel that their medical conditions are well managed. They appreciated the outreach, but do not feel that CCM services are needed at this time. They will reach out in the future if a need arises.   SDOH (Social Determinants of Health) assessments performed: Yes See Care Plan activities for detailed interventions related to SDOH     Plan:  . The patient has been provided with contact information for the care management team and has been advised to call if they would like to re-enroll in the CCM program to receive care management and care coordination services.  . CCM enrollment status changed to "previously enrolled" as per patient request on 03/16/2020  to discontinue enrollment. Case closed to case management services in primary care home.  . Patient was provided with general disease management education and encouraged to follow-up with their PCP and specialists as recommended.   William Flores, BSN, RN-BC Embedded Chronic Care Manager [facility] / [facility] [facility] Direct Dial: [phone]

## 2020-03-19 ENCOUNTER — Other Ambulatory Visit: Payer: Self-pay

## 2020-03-19 ENCOUNTER — Ambulatory Visit (INDEPENDENT_AMBULATORY_CARE_PROVIDER_SITE_OTHER): Payer: Medicare Other | Admitting: Family Medicine

## 2020-03-19 ENCOUNTER — Encounter: Payer: Self-pay | Admitting: Family Medicine

## 2020-03-19 VITALS — BP 128/72 | HR 92 | Ht 64.0 in | Wt 261.0 lb

## 2020-03-19 DIAGNOSIS — I5082 Biventricular heart failure: Secondary | ICD-10-CM | POA: Diagnosis not present

## 2020-03-19 DIAGNOSIS — I10 Essential (primary) hypertension: Secondary | ICD-10-CM

## 2020-03-19 DIAGNOSIS — I110 Hypertensive heart disease with heart failure: Secondary | ICD-10-CM

## 2020-03-19 DIAGNOSIS — E1169 Type 2 diabetes mellitus with other specified complication: Secondary | ICD-10-CM

## 2020-03-19 DIAGNOSIS — E785 Hyperlipidemia, unspecified: Secondary | ICD-10-CM

## 2020-03-19 DIAGNOSIS — E1122 Type 2 diabetes mellitus with diabetic chronic kidney disease: Secondary | ICD-10-CM

## 2020-03-19 DIAGNOSIS — E1129 Type 2 diabetes mellitus with other diabetic kidney complication: Secondary | ICD-10-CM | POA: Diagnosis not present

## 2020-03-19 DIAGNOSIS — N183 Chronic kidney disease, stage 3 unspecified: Secondary | ICD-10-CM | POA: Diagnosis not present

## 2020-03-19 DIAGNOSIS — E1165 Type 2 diabetes mellitus with hyperglycemia: Secondary | ICD-10-CM

## 2020-03-19 DIAGNOSIS — IMO0002 Reserved for concepts with insufficient information to code with codable children: Secondary | ICD-10-CM

## 2020-03-19 LAB — BAYER DCA HB A1C WAIVED: HB A1C (BAYER DCA - WAIVED): 9.6 % — ABNORMAL HIGH (ref <7.0)

## 2020-03-19 MED ORDER — ATORVASTATIN CALCIUM 20 MG PO TABS: ORAL_TABLET | ORAL | 3 refills | Status: DC

## 2020-03-19 MED ORDER — TRESIBA FLEXTOUCH 100 UNIT/ML ~~LOC~~ SOPN: 24.0000 [IU] | PEN_INJECTOR | Freq: Every day | SUBCUTANEOUS | 3 refills | Status: DC

## 2020-03-19 NOTE — Progress Notes (Signed)
BP 128/72    Pulse 92    Ht 5' 4"  (1.626 m)    Wt 261 lb (118.4 kg)    SpO2 99%    BMI 44.80 kg/m    Subjective:   Patient ID: William Flores, male    DOB: [birth year], 70 y.o.   MRN: [id number]  HPI: William Flores is a 70 y.o. male presenting on 03/19/2020 for Medical Management of Chronic Issues, Diabetes, Atrial Fibrillation, Hyperlipidemia, and Hypertension   HPI Type 2 diabetes mellitus Patient comes in today for recheck of his diabetes. Patient has been currently taking [place] and NovoLog and [place] and Trulicity. Patient is currently on an ACE inhibitor/ARB. Patient has not seen an ophthalmologist this year. Patient denies any issues with their feet. The symptom started onset as an adult hypertension hyperlipidemia and CKD stage III and CHF ARE RELATED TO DM   Hypertension Patient is currently on losartan and metoprolol and torsemide, and their blood pressure today is 128/72. Patient denies any lightheadedness or dizziness. Patient denies headaches, blurred vision, chest pains, shortness of breath, or weakness. Denies any side effects from medication and is content with current medication.   Hyperlipidemia Patient is coming in for recheck of his hyperlipidemia. The patient is currently taking atorvastatin. They deny any issues with myalgias or history of liver damage from it. They deny any focal numbness or weakness or chest pain.   Relevant past medical, surgical, family and social history reviewed and updated as indicated. Interim medical history since our last visit reviewed. Allergies and medications reviewed and updated.  Review of Systems  Constitutional: Negative for chills and fever.  Eyes: Negative for visual disturbance.  Respiratory: Negative for shortness of breath and wheezing.   Cardiovascular: Negative for chest pain and leg swelling.  Musculoskeletal: Negative for back pain and gait problem.  Skin: Negative for rash.  Neurological: Negative for dizziness,  weakness and light-headedness.  Psychiatric/Behavioral: Negative for dysphoric mood. The patient is not nervous/anxious.   All other systems reviewed and are negative.   Per HPI unless specifically indicated above   Allergies as of 03/19/2020      Reactions   Codeine    Headache      Medication List       Accurate as of March 19, 2020  1:54 PM. If you have any questions, ask your nurse or doctor.        acetaminophen 325 MG tablet Commonly known as: TYLENOL Take 2 tablets (650 mg total) by mouth every 6 (six) hours as needed for mild pain (or Fever >/= 101).   allopurinol 300 MG tablet Commonly known as: ZYLOPRIM Take 1 tablet (300 mg total) by mouth daily.   atorvastatin 20 MG tablet Commonly known as: LIPITOR TAKE 1 TABLET BY MOUTH EVERY DAY   azelastine 0.05 % ophthalmic solution Commonly known as: OPTIVAR PLACE 2 DROPS INTO BOTH EYES 2 (TWO) TIMES DAILY.   benzonatate 200 MG capsule Commonly known as: TESSALON Take 1 capsule (200 mg total) by mouth 2 (two) times daily.   benzonatate 100 MG capsule Commonly known as: TESSALON TAKE 1 CAPSULE BY MOUTH TWICE A DAY   BLOOD GLUCOSE TEST STRIPS Strp Test BS TID Dx E11.29   Farxiga 5 MG Tabs tablet Generic drug: dapagliflozin propanediol Take 5 mg by mouth daily.   ferrous sulfate 325 (65 FE) MG tablet Take 325 mg by mouth daily with breakfast.   Insulin Pen Needle 32G X 4 MM Misc Use to  give insulin daily Dx E11.29   Klor-Con M20 20 MEQ tablet Generic drug: potassium chloride SA TAKE 1/2 TABLET (10 MEQ TOTAL) BY MOUTH DAILY. NEED OFFICE VISIT   losartan 25 MG tablet Commonly known as: COZAAR Take 1 tablet (25 mg total) by mouth daily.   metoprolol succinate 50 MG 24 hr tablet Commonly known as: TOPROL-XL Take 1 tablet (50 mg total) by mouth daily. Take with or immediately following a meal.   NovoLOG FlexPen 100 UNIT/ML FlexPen Generic drug: insulin aspart insulin aspart (novoLOG) injection 0-9 Units  0-9 Units, Subcutaneous, 3 times daily with meals, CBG < 70: Implement Hypoglycemia Standing Orders and refer to Hypoglycemia Standing Orders sidebar report CBG 70 - 120: 0 units CBG 121 - 150: 1 unit CBG 151 - 200: 2 units CBG 201 - 250: 3 units CBG 251 - 300: 5 units CBG 301 - 350: 7 units CBG 351 - 400: 9 units CBG > 400   pantoprazole 40 MG tablet Commonly known as: PROTONIX TAKE 1 TABLET BY MOUTH EVERY DAY   polyethylene glycol 17 g packet Commonly known as: MIRALAX / GLYCOLAX Take 17 g by mouth daily.   sildenafil 20 MG tablet Commonly known as: REVATIO Take 1 tablet (20 mg total) by mouth 3 (three) times daily.   torsemide 20 MG tablet Commonly known as: DEMADEX TAKE 3 TABLETS (60 MG TOTAL) BY MOUTH IN THE MORNING AND 2 TABLETS (40 MG TOTAL) EVERY EVENING.   Tyler Aas FlexTouch 100 UNIT/ML FlexTouch Pen Generic drug: insulin degludec Inject 18 Units into the skin at bedtime.   Trulicity 1.5 XK/5.5VZ Sopn Generic drug: Dulaglutide Inject 1.5 mg into the skin once a week.   warfarin 5 MG tablet Commonly known as: COUMADIN Take as directed by the anticoagulation clinic. If you are unsure how to take this medication, talk to your nurse or doctor. Original instructions: Take 1 tablet (5 mg total) by mouth every evening.        Objective:   BP 128/72    Pulse 92    Ht 5' 4"  (1.626 m)    Wt 261 lb (118.4 kg)    SpO2 99%    BMI 44.80 kg/m   Wt Readings from Last 3 Encounters:  03/19/20 261 lb (118.4 kg)  12/20/19 266 lb (120.7 kg)  07/29/19 (!) 256 lb (116.1 kg)    Physical Exam Vitals and nursing note reviewed.  Constitutional:      General: He is not in acute distress.    Appearance: He is well-developed. He is not diaphoretic.  Eyes:     General: No scleral icterus.    Conjunctiva/sclera: Conjunctivae normal.  Neck:     Thyroid: No thyromegaly.  Cardiovascular:     Rate and Rhythm: Normal rate and regular rhythm.     Heart sounds: Normal heart sounds. No murmur  heard.   Pulmonary:     Effort: Pulmonary effort is normal. No respiratory distress.     Breath sounds: Normal breath sounds. No wheezing.  Musculoskeletal:        General: Normal range of motion.     Cervical back: Neck supple.  Lymphadenopathy:     Cervical: No cervical adenopathy.  Skin:    General: Skin is warm and dry.     Findings: No rash.  Neurological:     Mental Status: He is alert and oriented to person, place, and time.     Coordination: Coordination normal.  Psychiatric:  Behavior: Behavior normal.       Assessment & Plan:   Problem List Items Addressed This Visit      Cardiovascular and Mediastinum   Hypertensive heart disease (Chronic)   Relevant Medications   atorvastatin (LIPITOR) 20 MG tablet   Essential hypertension   Relevant Medications   atorvastatin (LIPITOR) 20 MG tablet   Other Relevant Orders   Bayer DCA Hb A1c Waived   CBC with Differential/Platelet   CMP14+EGFR   Lipid panel     Endocrine   DM (diabetes mellitus), type 2, uncontrolled, with renal complications (HCC) - Primary (Chronic)   Relevant Medications   atorvastatin (LIPITOR) 20 MG tablet   insulin degludec (TRESIBA FLEXTOUCH) 100 UNIT/ML FlexTouch Pen   Other Relevant Orders   Bayer DCA Hb A1c Waived   CBC with Differential/Platelet   CMP14+EGFR   Lipid panel   CKD stage 3 due to type 2 diabetes mellitus (HCC)   Relevant Medications   atorvastatin (LIPITOR) 20 MG tablet   insulin degludec (TRESIBA FLEXTOUCH) 100 UNIT/ML FlexTouch Pen   Hyperlipidemia associated with type 2 diabetes mellitus (HCC)   Relevant Medications   atorvastatin (LIPITOR) 20 MG tablet   insulin degludec (TRESIBA FLEXTOUCH) 100 UNIT/ML FlexTouch Pen   Other Relevant Orders   Bayer DCA Hb A1c Waived   CBC with Differential/Platelet   CMP14+EGFR   Lipid panel      Patient is on 2 L nasal cannula and stable on that.  He denies any significant issues.  His A1c is up to 9.8.  We will increase  his Tyler Aas to 24 units daily and send new prescription for that.  Continues to do home INRs Follow up plan: Return in about 3 months (around 06/19/2020), or if symptoms worsen or fail to improve, for Diabetes and hypertension and cholesterol.  Counseling provided for all of the vaccine components Orders Placed This Encounter  Procedures   Bayer [place] Hb A1c Waived   CBC with Differential/Platelet   CMP14+EGFR   Lipid panel    Caryl Pina, MD [facility] 03/19/2020, 1:54 PM

## 2020-03-20 LAB — CBC WITH DIFFERENTIAL/PLATELET
Basophils Absolute: 0 10*3/uL (ref 0.0–0.2)
Basos: 1 %
EOS (ABSOLUTE): 0.1 10*3/uL (ref 0.0–0.4)
Eos: 1 %
Hematocrit: 38.7 % (ref 37.5–51.0)
Hemoglobin: 12.2 g/dL — ABNORMAL LOW (ref 13.0–17.7)
Immature Grans (Abs): 0 10*3/uL (ref 0.0–0.1)
Immature Granulocytes: 1 %
Lymphocytes Absolute: 0.7 10*3/uL (ref 0.7–3.1)
Lymphs: 15 %
MCH: 30 pg (ref 26.6–33.0)
MCHC: 31.5 g/dL (ref 31.5–35.7)
MCV: 95 fL (ref 79–97)
Monocytes Absolute: 0.5 10*3/uL (ref 0.1–0.9)
Monocytes: 10 %
Neutrophils Absolute: 3.4 10*3/uL (ref 1.4–7.0)
Neutrophils: 72 %
Platelets: 120 10*3/uL — ABNORMAL LOW (ref 150–450)
RBC: 4.07 x10E6/uL — ABNORMAL LOW (ref 4.14–5.80)
RDW: 16.8 % — ABNORMAL HIGH (ref 11.6–15.4)
WBC: 4.7 10*3/uL (ref 3.4–10.8)

## 2020-03-20 LAB — CMP14+EGFR
ALT: 23 IU/L (ref 0–44)
AST: 26 IU/L (ref 0–40)
Albumin/Globulin Ratio: 1 — ABNORMAL LOW (ref 1.2–2.2)
Albumin: 3.5 g/dL — ABNORMAL LOW (ref 3.8–4.8)
Alkaline Phosphatase: 104 IU/L (ref 44–121)
BUN/Creatinine Ratio: 28 — ABNORMAL HIGH (ref 10–24)
BUN: 51 mg/dL — ABNORMAL HIGH (ref 8–27)
Bilirubin Total: 0.5 mg/dL (ref 0.0–1.2)
CO2: 23 mmol/L (ref 20–29)
Calcium: 9 mg/dL (ref 8.6–10.2)
Chloride: 103 mmol/L (ref 96–106)
Creatinine, Ser: 1.79 mg/dL — ABNORMAL HIGH (ref 0.76–1.27)
Globulin, Total: 3.4 g/dL (ref 1.5–4.5)
Glucose: 202 mg/dL — ABNORMAL HIGH (ref 65–99)
Potassium: 4.2 mmol/L (ref 3.5–5.2)
Sodium: 142 mmol/L (ref 134–144)
Total Protein: 6.9 g/dL (ref 6.0–8.5)
eGFR: 41 mL/min/{1.73_m2} — ABNORMAL LOW (ref >59)

## 2020-03-20 LAB — LIPID PANEL
Chol/HDL Ratio: 5.2 ratio — ABNORMAL HIGH (ref 0.0–5.0)
Cholesterol, Total: 103 mg/dL (ref 100–199)
HDL: 20 mg/dL — ABNORMAL LOW (ref >39)
LDL Chol Calc (NIH): 58 mg/dL (ref 0–99)
Triglycerides: 140 mg/dL (ref 0–149)
VLDL Cholesterol Cal: 25 mg/dL (ref 5–40)

## 2020-03-23 ENCOUNTER — Telehealth: Payer: Self-pay | Admitting: *Deleted

## 2020-03-23 NOTE — Telephone Encounter (Signed)
Description   continue to take  7.5 mg on Mondays and 5 mg the rest of the days  INR was  2.7 today (goal is 2-3) Recheck in [place], MD [facility] 03/23/2020, 1:08 PM

## 2020-03-23 NOTE — Telephone Encounter (Signed)
Left message for pt informing of dose.

## 2020-03-23 NOTE — Telephone Encounter (Signed)
Fax received mdINR PT/INR self testing service Test date/time 03/20/20 743 pm INR 2.7

## 2020-03-30 ENCOUNTER — Telehealth: Payer: Self-pay | Admitting: *Deleted

## 2020-03-30 NOTE — Telephone Encounter (Signed)
Fax received mdINR PT/INR self testing service Test date/time 03/28/20 713 pm INR 2.0

## 2020-03-30 NOTE — Telephone Encounter (Signed)
continue to take  7.5 mg on Mondays and 5 mg the rest of the days  INR was  2.0 today (goal is 2-3) Recheck in 1-2weeks

## 2020-03-30 NOTE — Telephone Encounter (Signed)
Called patient William Flores

## 2020-04-07 ENCOUNTER — Telehealth: Payer: Self-pay

## 2020-04-07 NOTE — Telephone Encounter (Signed)
Fax received mdINR PT/INR self testing service Test date/time 04/07/20 1:17 pm INR 2.3

## 2020-04-08 NOTE — Telephone Encounter (Signed)
Patient aware of results and instruction

## 2020-04-08 NOTE — Telephone Encounter (Signed)
Description   Continue to take  7.5 mg on Mondays and 5 mg the rest of the days  INR was  2.3 today (goal is 2-3) Recheck in 1-2weeks

## 2020-04-08 NOTE — Telephone Encounter (Signed)
Lmtcb.

## 2020-04-16 ENCOUNTER — Telehealth: Payer: Self-pay

## 2020-04-16 DIAGNOSIS — I4821 Permanent atrial fibrillation: Secondary | ICD-10-CM | POA: Diagnosis not present

## 2020-04-16 DIAGNOSIS — Z7901 Long term (current) use of anticoagulants: Secondary | ICD-10-CM | POA: Diagnosis not present

## 2020-04-16 NOTE — Telephone Encounter (Signed)
Fax received mdINR PT/INR self testing service Test date/time 04/16/20, 8:29 am INR 2.3

## 2020-04-16 NOTE — Telephone Encounter (Signed)
Description   Continue to take  7.5 mg on Mondays and 5 mg the rest of the days  INR was  2.3 today (goal is 2-3) Recheck in [place], MD [place] 04/16/2020, 12:37 PM

## 2020-04-16 NOTE — Telephone Encounter (Signed)
Patient aware.

## 2020-04-16 NOTE — Telephone Encounter (Signed)
Patient aware and verbalized understanding. °

## 2020-04-27 ENCOUNTER — Telehealth: Payer: Self-pay | Admitting: *Deleted

## 2020-04-27 NOTE — Telephone Encounter (Signed)
Description   Continue to take  7.5 mg on Mondays and 5 mg the rest of the days  INR was  2.0 today (goal is 2-3) Recheck in [place], MD [place] 04/27/2020, 10:48 PM

## 2020-04-27 NOTE — Telephone Encounter (Signed)
Fax received mdINR PT/INR self testing service Test date/time 04/27/20 1200 am INR 2.0

## 2020-04-28 NOTE — Telephone Encounter (Signed)
Pt aware.

## 2020-04-30 ENCOUNTER — Ambulatory Visit: Payer: Medicare Other

## 2020-05-06 ENCOUNTER — Other Ambulatory Visit (HOSPITAL_COMMUNITY): Payer: Self-pay | Admitting: Adult Health

## 2020-05-06 ENCOUNTER — Telehealth: Payer: Self-pay | Admitting: *Deleted

## 2020-05-06 NOTE — Telephone Encounter (Signed)
Fax received mdINR PT/INR self testing service Test date/time 05/06/20 1112 am INR 2.3

## 2020-05-06 NOTE — Telephone Encounter (Signed)
Description   Continue to take  7.5 mg on Mondays and 5 mg the rest of the days  INR was  2.3 today (goal is 2-3) Recheck in [place], MD [place] 05/06/2020, 2:20 PM

## 2020-05-06 NOTE — Telephone Encounter (Signed)
Pt informed

## 2020-05-14 ENCOUNTER — Telehealth: Payer: Self-pay | Admitting: *Deleted

## 2020-05-14 NOTE — Telephone Encounter (Signed)
Fax received mdINR PT/INR self testing service Test date/time 05/13/20 615 pm INR 2.3

## 2020-05-14 NOTE — Telephone Encounter (Signed)
Description   Continue to take  7.5 mg on Mondays and 5 mg the rest of the days  INR was  2.3 today (goal is 2-3) Recheck in [place], MD [place] 05/14/2020, 4:35 PM

## 2020-05-14 NOTE — Telephone Encounter (Signed)
lmTcb

## 2020-05-19 NOTE — Telephone Encounter (Signed)
Left message to call back  

## 2020-05-20 ENCOUNTER — Other Ambulatory Visit (HOSPITAL_COMMUNITY): Payer: Self-pay | Admitting: Cardiology

## 2020-05-20 NOTE — Telephone Encounter (Signed)
rc for nurse 

## 2020-05-20 NOTE — Telephone Encounter (Signed)
Pt has been made aware of his INR results. He has no concerns at this time.

## 2020-05-22 ENCOUNTER — Other Ambulatory Visit: Payer: Self-pay | Admitting: Family Medicine

## 2020-05-25 ENCOUNTER — Telehealth: Payer: Self-pay | Admitting: *Deleted

## 2020-05-25 ENCOUNTER — Telehealth (HOSPITAL_COMMUNITY): Payer: Self-pay

## 2020-05-25 ENCOUNTER — Other Ambulatory Visit (HOSPITAL_COMMUNITY): Payer: Self-pay | Admitting: Cardiology

## 2020-05-25 DIAGNOSIS — Z7901 Long term (current) use of anticoagulants: Secondary | ICD-10-CM | POA: Diagnosis not present

## 2020-05-25 DIAGNOSIS — I4821 Permanent atrial fibrillation: Secondary | ICD-10-CM | POA: Diagnosis not present

## 2020-05-25 MED ORDER — TORSEMIDE 20 MG PO TABS: ORAL_TABLET | ORAL | 0 refills | Status: DC

## 2020-05-25 NOTE — Telephone Encounter (Signed)
Description   Continue to take  7.5 mg on Mondays and 5 mg the rest of the days  INR was  2.0 today (goal is 2-3) Recheck in [place], MD [place] 05/25/2020, 1:11 PM

## 2020-05-25 NOTE — Telephone Encounter (Signed)
Patient aware and verbalized understanding. °

## 2020-05-25 NOTE — Telephone Encounter (Signed)
Fax received mdINR PT/INR self testing service Test date/time 05/25/20 1030 am INR 2.0

## 2020-05-31 ENCOUNTER — Other Ambulatory Visit: Payer: Self-pay | Admitting: Family Medicine

## 2020-06-04 ENCOUNTER — Telehealth: Payer: Self-pay | Admitting: *Deleted

## 2020-06-04 NOTE — Telephone Encounter (Signed)
Fax received mdINR PT/INR self testing service Test date/time 06/04/20 228 pm INR 2.3

## 2020-06-04 NOTE — Telephone Encounter (Signed)
INR was 2.3 today (goal is 2-3) Continue to take 7.5 mg on Mondays and 5 mg the rest of the days Recheck in 1-2weeks

## 2020-06-05 NOTE — Telephone Encounter (Signed)
Lmtcb.

## 2020-06-17 ENCOUNTER — Telehealth: Payer: Self-pay | Admitting: *Deleted

## 2020-06-17 NOTE — Telephone Encounter (Signed)
Description   INR was 2.0 today (goal is 2-3) Continue to take 7.5 mg on Mondays and 5 mg the rest of the days Recheck in [place], MD [place] 06/17/2020, 9:47 AM

## 2020-06-17 NOTE — Telephone Encounter (Signed)
Pt informed of results on home vmail. Instructed to call back if needed.

## 2020-06-17 NOTE — Telephone Encounter (Signed)
Fax received mdINR PT/INR self testing service Test date/time 06/16/20 800 pm INR 2.0

## 2020-06-19 ENCOUNTER — Inpatient Hospital Stay (HOSPITAL_COMMUNITY)
Admission: EM | Admit: 2020-06-19 | Discharge: 2020-06-25 | DRG: 312 | Disposition: A | Discharge status: Skilled Nursing Facility | Payer: Medicare Other | Attending: Family Medicine | Admitting: Family Medicine

## 2020-06-19 ENCOUNTER — Emergency Department (HOSPITAL_COMMUNITY): Payer: Medicare Other

## 2020-06-19 ENCOUNTER — Ambulatory Visit (INDEPENDENT_AMBULATORY_CARE_PROVIDER_SITE_OTHER): Payer: Medicare Other | Admitting: Family Medicine

## 2020-06-19 ENCOUNTER — Other Ambulatory Visit: Payer: Self-pay

## 2020-06-19 DIAGNOSIS — I2781 Cor pulmonale (chronic): Secondary | ICD-10-CM | POA: Diagnosis present

## 2020-06-19 DIAGNOSIS — E876 Hypokalemia: Secondary | ICD-10-CM

## 2020-06-19 DIAGNOSIS — E785 Hyperlipidemia, unspecified: Secondary | ICD-10-CM

## 2020-06-19 DIAGNOSIS — E1122 Type 2 diabetes mellitus with diabetic chronic kidney disease: Secondary | ICD-10-CM | POA: Diagnosis not present

## 2020-06-19 DIAGNOSIS — E1165 Type 2 diabetes mellitus with hyperglycemia: Secondary | ICD-10-CM

## 2020-06-19 DIAGNOSIS — I50812 Chronic right heart failure: Secondary | ICD-10-CM | POA: Diagnosis present

## 2020-06-19 DIAGNOSIS — I5042 Chronic combined systolic (congestive) and diastolic (congestive) heart failure: Secondary | ICD-10-CM | POA: Diagnosis present

## 2020-06-19 DIAGNOSIS — I272 Pulmonary hypertension, unspecified: Secondary | ICD-10-CM | POA: Diagnosis present

## 2020-06-19 DIAGNOSIS — Z7901 Long term (current) use of anticoagulants: Secondary | ICD-10-CM | POA: Diagnosis not present

## 2020-06-19 DIAGNOSIS — N179 Acute kidney failure, unspecified: Secondary | ICD-10-CM

## 2020-06-19 DIAGNOSIS — D649 Anemia, unspecified: Secondary | ICD-10-CM | POA: Diagnosis not present

## 2020-06-19 DIAGNOSIS — I509 Heart failure, unspecified: Secondary | ICD-10-CM

## 2020-06-19 DIAGNOSIS — D62 Acute posthemorrhagic anemia: Secondary | ICD-10-CM | POA: Diagnosis present

## 2020-06-19 DIAGNOSIS — I428 Other cardiomyopathies: Secondary | ICD-10-CM

## 2020-06-19 DIAGNOSIS — R079 Chest pain, unspecified: Secondary | ICD-10-CM

## 2020-06-19 DIAGNOSIS — Z01818 Encounter for other preprocedural examination: Secondary | ICD-10-CM

## 2020-06-19 DIAGNOSIS — I4891 Unspecified atrial fibrillation: Secondary | ICD-10-CM | POA: Diagnosis present

## 2020-06-19 DIAGNOSIS — I469 Cardiac arrest, cause unspecified: Secondary | ICD-10-CM | POA: Diagnosis not present

## 2020-06-19 DIAGNOSIS — K2950 Unspecified chronic gastritis without bleeding: Secondary | ICD-10-CM

## 2020-06-19 DIAGNOSIS — K2951 Unspecified chronic gastritis with bleeding: Secondary | ICD-10-CM | POA: Diagnosis not present

## 2020-06-19 DIAGNOSIS — I130 Hypertensive heart and chronic kidney disease with heart failure and stage 1 through stage 4 chronic kidney disease, or unspecified chronic kidney disease: Secondary | ICD-10-CM | POA: Diagnosis present

## 2020-06-19 DIAGNOSIS — Z79899 Other long term (current) drug therapy: Secondary | ICD-10-CM

## 2020-06-19 DIAGNOSIS — I2729 Other secondary pulmonary hypertension: Secondary | ICD-10-CM | POA: Diagnosis present

## 2020-06-19 DIAGNOSIS — G4733 Obstructive sleep apnea (adult) (pediatric): Secondary | ICD-10-CM

## 2020-06-19 DIAGNOSIS — E1129 Type 2 diabetes mellitus with other diabetic kidney complication: Secondary | ICD-10-CM | POA: Diagnosis not present

## 2020-06-19 DIAGNOSIS — R55 Syncope and collapse: Secondary | ICD-10-CM | POA: Diagnosis not present

## 2020-06-19 DIAGNOSIS — R195 Other fecal abnormalities: Secondary | ICD-10-CM

## 2020-06-19 DIAGNOSIS — Z794 Long term (current) use of insulin: Secondary | ICD-10-CM

## 2020-06-19 DIAGNOSIS — D696 Thrombocytopenia, unspecified: Secondary | ICD-10-CM | POA: Diagnosis not present

## 2020-06-19 DIAGNOSIS — J9611 Chronic respiratory failure with hypoxia: Secondary | ICD-10-CM | POA: Diagnosis present

## 2020-06-19 DIAGNOSIS — I110 Hypertensive heart disease with heart failure: Secondary | ICD-10-CM | POA: Diagnosis not present

## 2020-06-19 DIAGNOSIS — I248 Other forms of acute ischemic heart disease: Secondary | ICD-10-CM | POA: Diagnosis present

## 2020-06-19 DIAGNOSIS — N183 Chronic kidney disease, stage 3 unspecified: Secondary | ICD-10-CM

## 2020-06-19 DIAGNOSIS — J9811 Atelectasis: Secondary | ICD-10-CM | POA: Diagnosis present

## 2020-06-19 DIAGNOSIS — I517 Cardiomegaly: Secondary | ICD-10-CM | POA: Diagnosis not present

## 2020-06-19 DIAGNOSIS — Z885 Allergy status to narcotic agent status: Secondary | ICD-10-CM

## 2020-06-19 DIAGNOSIS — Z9981 Dependence on supplemental oxygen: Secondary | ICD-10-CM

## 2020-06-19 DIAGNOSIS — Z20822 Contact with and (suspected) exposure to covid-19: Secondary | ICD-10-CM | POA: Diagnosis present

## 2020-06-19 DIAGNOSIS — Z6841 Body Mass Index (BMI) 40.0 and over, adult: Secondary | ICD-10-CM

## 2020-06-19 DIAGNOSIS — I50810 Right heart failure, unspecified: Secondary | ICD-10-CM | POA: Diagnosis present

## 2020-06-19 DIAGNOSIS — J449 Chronic obstructive pulmonary disease, unspecified: Secondary | ICD-10-CM | POA: Diagnosis present

## 2020-06-19 DIAGNOSIS — E662 Morbid (severe) obesity with alveolar hypoventilation: Secondary | ICD-10-CM | POA: Diagnosis present

## 2020-06-19 DIAGNOSIS — R791 Abnormal coagulation profile: Secondary | ICD-10-CM

## 2020-06-19 DIAGNOSIS — I482 Chronic atrial fibrillation, unspecified: Secondary | ICD-10-CM | POA: Diagnosis present

## 2020-06-19 DIAGNOSIS — T45515A Adverse effect of anticoagulants, initial encounter: Secondary | ICD-10-CM | POA: Diagnosis present

## 2020-06-19 DIAGNOSIS — E1169 Type 2 diabetes mellitus with other specified complication: Secondary | ICD-10-CM

## 2020-06-19 DIAGNOSIS — I499 Cardiac arrhythmia, unspecified: Secondary | ICD-10-CM | POA: Diagnosis not present

## 2020-06-19 DIAGNOSIS — I493 Ventricular premature depolarization: Secondary | ICD-10-CM | POA: Diagnosis present

## 2020-06-19 DIAGNOSIS — R0789 Other chest pain: Secondary | ICD-10-CM | POA: Diagnosis not present

## 2020-06-19 DIAGNOSIS — N1831 Chronic kidney disease, stage 3a: Secondary | ICD-10-CM | POA: Diagnosis present

## 2020-06-19 DIAGNOSIS — I119 Hypertensive heart disease without heart failure: Secondary | ICD-10-CM | POA: Diagnosis present

## 2020-06-19 DIAGNOSIS — Z8249 Family history of ischemic heart disease and other diseases of the circulatory system: Secondary | ICD-10-CM

## 2020-06-19 DIAGNOSIS — E782 Mixed hyperlipidemia: Secondary | ICD-10-CM | POA: Diagnosis present

## 2020-06-19 DIAGNOSIS — Z833 Family history of diabetes mellitus: Secondary | ICD-10-CM

## 2020-06-19 DIAGNOSIS — Z85038 Personal history of other malignant neoplasm of large intestine: Secondary | ICD-10-CM

## 2020-06-19 DIAGNOSIS — Z87891 Personal history of nicotine dependence: Secondary | ICD-10-CM

## 2020-06-19 DIAGNOSIS — J811 Chronic pulmonary edema: Secondary | ICD-10-CM | POA: Diagnosis not present

## 2020-06-19 DIAGNOSIS — R059 Cough, unspecified: Secondary | ICD-10-CM

## 2020-06-19 DIAGNOSIS — Z803 Family history of malignant neoplasm of breast: Secondary | ICD-10-CM

## 2020-06-19 DIAGNOSIS — J90 Pleural effusion, not elsewhere classified: Secondary | ICD-10-CM | POA: Diagnosis not present

## 2020-06-19 DIAGNOSIS — I50813 Acute on chronic right heart failure: Secondary | ICD-10-CM

## 2020-06-19 DIAGNOSIS — R4020 Unspecified coma: Secondary | ICD-10-CM | POA: Diagnosis not present

## 2020-06-19 DIAGNOSIS — I5023 Acute on chronic systolic (congestive) heart failure: Secondary | ICD-10-CM | POA: Diagnosis not present

## 2020-06-19 DIAGNOSIS — I2510 Atherosclerotic heart disease of native coronary artery without angina pectoris: Secondary | ICD-10-CM | POA: Diagnosis present

## 2020-06-19 DIAGNOSIS — I5082 Biventricular heart failure: Secondary | ICD-10-CM

## 2020-06-19 LAB — CBC WITH DIFFERENTIAL/PLATELET
Abs Immature Granulocytes: 0.04 10*3/uL (ref 0.00–0.07)
Basophils Absolute: 0.1 10*3/uL (ref 0.0–0.1)
Basophils Relative: 1 %
Eosinophils Absolute: 0.1 10*3/uL (ref 0.0–0.5)
Eosinophils Relative: 1 %
HCT: 30.6 % — ABNORMAL LOW (ref 39.0–52.0)
Hemoglobin: 9 g/dL — ABNORMAL LOW (ref 13.0–17.0)
Immature Granulocytes: 1 %
Lymphocytes Relative: 7 %
Lymphs Abs: 0.4 10*3/uL — ABNORMAL LOW (ref 0.7–4.0)
MCH: 28.6 pg (ref 26.0–34.0)
MCHC: 29.4 g/dL — ABNORMAL LOW (ref 30.0–36.0)
MCV: 97.1 fL (ref 80.0–100.0)
Monocytes Absolute: 0.4 10*3/uL (ref 0.1–1.0)
Monocytes Relative: 7 %
Neutro Abs: 4.4 10*3/uL (ref 1.7–7.7)
Neutrophils Relative %: 83 %
Platelets: 86 10*3/uL — ABNORMAL LOW (ref 150–400)
RBC: 3.15 MIL/uL — ABNORMAL LOW (ref 4.22–5.81)
RDW: 16.7 % — ABNORMAL HIGH (ref 11.5–15.5)
WBC: 5.3 10*3/uL (ref 4.0–10.5)
nRBC: 0 % (ref 0.0–0.2)

## 2020-06-19 LAB — PROTIME-INR
INR: 3.6 — ABNORMAL HIGH (ref 0.8–1.2)
Prothrombin Time: 36.2 seconds — ABNORMAL HIGH (ref 11.4–15.2)

## 2020-06-19 LAB — COMPREHENSIVE METABOLIC PANEL
ALT: 11 U/L (ref 0–44)
AST: 13 U/L — ABNORMAL LOW (ref 15–41)
Albumin: 2.4 g/dL — ABNORMAL LOW (ref 3.5–5.0)
Alkaline Phosphatase: 49 U/L (ref 38–126)
Anion gap: 8 (ref 5–15)
BUN: 39 mg/dL — ABNORMAL HIGH (ref 8–23)
CO2: 20 mmol/L — ABNORMAL LOW (ref 22–32)
Calcium: 7 mg/dL — ABNORMAL LOW (ref 8.9–10.3)
Chloride: 113 mmol/L — ABNORMAL HIGH (ref 98–111)
Creatinine, Ser: 1.77 mg/dL — ABNORMAL HIGH (ref 0.61–1.24)
GFR, Estimated: 41 mL/min — ABNORMAL LOW (ref >60)
Glucose, Bld: 153 mg/dL — ABNORMAL HIGH (ref 70–99)
Potassium: 3.1 mmol/L — ABNORMAL LOW (ref 3.5–5.1)
Sodium: 141 mmol/L (ref 135–145)
Total Bilirubin: 0.7 mg/dL (ref 0.3–1.2)
Total Protein: 4.7 g/dL — ABNORMAL LOW (ref 6.5–8.1)

## 2020-06-19 LAB — SARS CORONAVIRUS 2 (TAT 6-24 HRS): SARS Coronavirus 2: NEGATIVE

## 2020-06-19 LAB — GLUCOSE, CAPILLARY: Glucose-Capillary: 192 mg/dL — ABNORMAL HIGH (ref 70–99)

## 2020-06-19 LAB — TROPONIN I (HIGH SENSITIVITY)
Troponin I (High Sensitivity): 10 ng/L (ref <18)
Troponin I (High Sensitivity): 19 ng/L — ABNORMAL HIGH (ref <18)

## 2020-06-19 LAB — POC OCCULT BLOOD, ED: Fecal Occult Bld: POSITIVE — AB

## 2020-06-19 LAB — MAGNESIUM: Magnesium: 2.1 mg/dL (ref 1.7–2.4)

## 2020-06-19 LAB — BRAIN NATRIURETIC PEPTIDE: B Natriuretic Peptide: 308.6 pg/mL — ABNORMAL HIGH (ref 0.0–100.0)

## 2020-06-19 LAB — CBG MONITORING, ED: Glucose-Capillary: 170 mg/dL — ABNORMAL HIGH (ref 70–99)

## 2020-06-19 MED ORDER — PANTOPRAZOLE SODIUM 40 MG PO TBEC
40.0000 mg | DELAYED_RELEASE_TABLET | Freq: Every day | ORAL | Status: DC
  Administered 2020-06-20 – 2020-06-25 (×6): 40 mg via ORAL
  Filled 2020-06-19 (×6): qty 1

## 2020-06-19 MED ORDER — SODIUM CHLORIDE 0.9% FLUSH: 3.0000 mL | INTRAVENOUS | Status: DC | PRN

## 2020-06-19 MED ORDER — INSULIN GLARGINE 100 UNIT/ML ~~LOC~~ SOLN
20.0000 [IU] | Freq: Every day | SUBCUTANEOUS | Status: DC
  Administered 2020-06-20 – 2020-06-25 (×6): 20 [IU] via SUBCUTANEOUS
  Filled 2020-06-19 (×6): qty 0.2

## 2020-06-19 MED ORDER — SODIUM CHLORIDE 0.9 % IV SOLN: 250.0000 mL | INTRAVENOUS | Status: DC | PRN

## 2020-06-19 MED ORDER — INSULIN ASPART 100 UNIT/ML IJ SOLN
0.0000 [IU] | Freq: Three times a day (TID) | INTRAMUSCULAR | Status: DC
  Administered 2020-06-20 – 2020-06-21 (×4): 3 [IU] via SUBCUTANEOUS
  Administered 2020-06-21 – 2020-06-22 (×3): 2 [IU] via SUBCUTANEOUS
  Administered 2020-06-23 (×2): 3 [IU] via SUBCUTANEOUS
  Administered 2020-06-23 – 2020-06-25 (×4): 2 [IU] via SUBCUTANEOUS
  Administered 2020-06-25 (×2): 3 [IU] via SUBCUTANEOUS

## 2020-06-19 MED ORDER — METOPROLOL SUCCINATE ER 50 MG PO TB24
50.0000 mg | ORAL_TABLET | Freq: Every day | ORAL | Status: DC
  Administered 2020-06-20 – 2020-06-25 (×6): 50 mg via ORAL
  Filled 2020-06-19 (×7): qty 1

## 2020-06-19 MED ORDER — POLYETHYLENE GLYCOL 3350 17 G PO PACK
17.0000 g | PACK | Freq: Every day | ORAL | Status: DC
  Administered 2020-06-20 – 2020-06-25 (×5): 17 g via ORAL
  Filled 2020-06-19 (×6): qty 1

## 2020-06-19 MED ORDER — CALCIUM GLUCONATE-NACL 1-0.675 GM/50ML-% IV SOLN
1.0000 g | Freq: Once | INTRAVENOUS | Status: AC
  Administered 2020-06-19: 1000 mg via INTRAVENOUS
  Filled 2020-06-19: qty 50

## 2020-06-19 MED ORDER — FERROUS SULFATE 325 (65 FE) MG PO TABS
325.0000 mg | ORAL_TABLET | Freq: Every day | ORAL | Status: DC
  Administered 2020-06-20 – 2020-06-25 (×6): 325 mg via ORAL
  Filled 2020-06-19 (×6): qty 1

## 2020-06-19 MED ORDER — SODIUM CHLORIDE 0.9% FLUSH
3.0000 mL | Freq: Two times a day (BID) | INTRAVENOUS | Status: DC
  Administered 2020-06-19 – 2020-06-25 (×10): 3 mL via INTRAVENOUS

## 2020-06-19 MED ORDER — ACETAMINOPHEN 500 MG PO TABS
1000.0000 mg | ORAL_TABLET | Freq: Once | ORAL | Status: AC
  Administered 2020-06-19: 1000 mg via ORAL
  Filled 2020-06-19: qty 2

## 2020-06-19 MED ORDER — INSULIN ASPART 100 UNIT/ML IJ SOLN: 0.0000 [IU] | Freq: Every day | INTRAMUSCULAR | Status: DC

## 2020-06-19 MED ORDER — ATORVASTATIN CALCIUM 10 MG PO TABS
20.0000 mg | ORAL_TABLET | Freq: Every day | ORAL | Status: DC
  Administered 2020-06-20 – 2020-06-25 (×6): 20 mg via ORAL
  Filled 2020-06-19 (×6): qty 2

## 2020-06-19 MED ORDER — MORPHINE SULFATE (PF) 2 MG/ML IV SOLN
2.0000 mg | Freq: Once | INTRAVENOUS | Status: AC
Start: 2020-06-19 — End: 2020-06-19
  Administered 2020-06-19: 2 mg via INTRAVENOUS
  Filled 2020-06-19: qty 1

## 2020-06-19 MED ORDER — MORPHINE SULFATE (PF) 2 MG/ML IV SOLN
2.0000 mg | INTRAVENOUS | Status: AC | PRN
  Administered 2020-06-19 – 2020-06-20 (×3): 2 mg via INTRAVENOUS
  Filled 2020-06-19 (×3): qty 1

## 2020-06-19 MED ORDER — LOSARTAN POTASSIUM 25 MG PO TABS
25.0000 mg | ORAL_TABLET | Freq: Every day | ORAL | Status: DC
  Administered 2020-06-20 – 2020-06-21 (×2): 25 mg via ORAL
  Filled 2020-06-19 (×2): qty 1

## 2020-06-19 MED ORDER — POTASSIUM CHLORIDE 10 MEQ/100ML IV SOLN
10.0000 meq | Freq: Once | INTRAVENOUS | Status: AC
  Administered 2020-06-19: 10 meq via INTRAVENOUS
  Filled 2020-06-19: qty 100

## 2020-06-19 MED ORDER — POTASSIUM CHLORIDE CRYS ER 20 MEQ PO TBCR
20.0000 meq | EXTENDED_RELEASE_TABLET | Freq: Two times a day (BID) | ORAL | Status: AC
  Administered 2020-06-19 – 2020-06-20 (×2): 20 meq via ORAL
  Filled 2020-06-19 (×2): qty 1

## 2020-06-19 MED ORDER — FUROSEMIDE 10 MG/ML IJ SOLN
40.0000 mg | Freq: Once | INTRAMUSCULAR | Status: AC
  Administered 2020-06-19: 40 mg via INTRAVENOUS
  Filled 2020-06-19: qty 4

## 2020-06-19 NOTE — ED Notes (Signed)
Attempted report to floor. Nurse not available. Will attempt report again.

## 2020-06-19 NOTE — ED Triage Notes (Signed)
Was seen at his doctors office today of a regular check up and became unresponsive. His PCP staff stated that he was pulseless and started CPR for 2 minutes. The AED said not to shock. C/o chest pain from where the CPR was started.

## 2020-06-19 NOTE — ED Provider Notes (Signed)
[facility] EMERGENCY DEPARTMENT Provider Note   CSN: [id number] Arrival date & time: 06/19/20  1506     History Chief Complaint  Patient presents with   Loss of Consciousness    William Flores is a 70 y.o. male with Pmhx HTN, HLD, Diabetes, CKD stage III, A fib on Coumadin, CHF with EF 55-60%, CAD who presents to the ED today via EMS for syncopal episode vs cardiac arrest. Pt reports he went to his PCP for a routine visit. As he was checking in he began to feel lightheaded and like he was going to pass out. Pt went to sit down and the next thing remembers he was receiving chest compressions. Per chart review: Pt was found starting to lose consciousness per the nurses in the lobby and code was called. He was found to be unconscious by the time the physicians arrived and he was lowered to the floor and chest compressions were started. Within a minute pt regained consciousness and ROSC was obtained. AED pads were placed on his chest and analyzed; no shock warranted. Pt then transferred to the ED for further evaluation. Pt's only complaint currently is that his chest hurts from where he had compressions done. He denies any chest pain or SOB prior to the lightheadedness. He felt fine this morning upon waking up.    The history is provided by the patient and medical records.      Past Medical History:  Diagnosis Date   CAD (coronary artery disease)    LHC 2/08:  mRCA 50%, o/w no sig CAD, EF 25%   Cardiomyopathy ([place])    EF 35-40% in 2/16 in setting of AF with RVR >> echo 5/16 with improved LVF with EF 65-70%   Chronic atrial fibrillation (HCC)    Chronic diastolic heart failure (HCC)    HFPF - EF ~65-70% (OFTEN EXACERBATED BY AFIB)   CKD (chronic kidney disease)    hx of worsening renal failure and hyperK+ in setting of acute diast CHF >> required CVVHD in 4/16 and dialysis x 1 in 5/16   CKD stage 3 due to type 2 diabetes mellitus ([place]) 09/17/2015   Colon cancer ([place])     Colon polyps 09/21/2012   Tubular adenoma   Diabetes ([place])    History of echocardiogram    Echo 05/16/14:  mild LVH, vigorous LVF, EF 65-70%, no RWMA, mean AV gradient 9 mmHg, MAC, mild MR, mod LAE, mild RVE, mild reduced RVF, severe RAE   Hyperlipidemia    Hypertension    Obesity hypoventilation syndrome ([place])    Renal insufficiency    Sleep apnea     Patient Active Problem List   Diagnosis Date Noted   Cor pulmonale, acute ([place]) 04/15/2019   Bilateral leg edema    Chronic renal failure    Pulmonary hypertension ([place]) 12/28/2018   Hyperlipidemia associated with type 2 diabetes mellitus ([place]) 05/18/2017   Morbid obesity with BMI of 45.0-49.9, adult ([place]) 05/18/2017   CKD stage 3 due to type 2 diabetes mellitus ([place]) 09/17/2015   MGUS (monoclonal gammopathy of unknown significance) 05/27/2015   Normocytic anemia 11/04/2014   Peripheral edema    Essential hypertension    DM (diabetes mellitus), type 2, uncontrolled, with renal complications ([place]) 02/63/7858   Dysphagia 04/20/2014   Atrial fibrillation with slow ventricular response ([place]) 04/14/2014   Anticoagulated on Coumadin 03/20/2014   Nonischemic cardiomyopathy ([place]) 02/20/2014   OSA (obstructive sleep apnea)    Right heart failure (  [place]) 02/10/2014   Morbid obesity ([place]) 08/05/2013   History of colon cancer    Gout 05/25/2012   Hypertensive heart disease     Past Surgical History:  Procedure Laterality Date   CIRCUMCISION     COLON RESECTION     COLONOSCOPY N/A 09/21/2012   Procedure: COLONOSCOPY;  Surgeon: Inda Castle, MD;  Location: [facility];  Service: Endoscopy;  Laterality: N/A;   COLONOSCOPY N/A 12/28/2018   Procedure: COLONOSCOPY;  Surgeon: Rogene Houston, MD;  Location: [facility] SUITE;  Service: Endoscopy;  Laterality: N/A;   ESOPHAGOGASTRODUODENOSCOPY N/A 12/28/2018   Procedure: ESOPHAGOGASTRODUODENOSCOPY (EGD);  Surgeon: Rogene Houston, MD;  Location: [facility];  Service: Endoscopy;  Laterality:  N/A;   RIGHT HEART CATH N/A 04/11/2019   Procedure: RIGHT HEART CATH;  Surgeon: Larey Dresser, MD;  Location: [facility] CV LAB;  Service: Cardiovascular;  Laterality: N/A;   US ECHOCARDIOGRAPHY  03/04/2010   abnormal study       Family History  Problem Relation Age of Onset   Breast cancer Mother    Diabetes Mother    Heart attack Father     Social History   Tobacco Use   Smoking status: Former    Pack years: 0.00    Types: Cigarettes    Quit date: 08/06/1983    Years since quitting: 36.8   Smokeless tobacco: Never  Substance Use Topics   Alcohol use: No   Drug use: No    Home Medications Prior to Admission medications   Medication Sig Start Date End Date Taking? Authorizing Provider  acetaminophen (TYLENOL) 325 MG tablet Take 2 tablets (650 mg total) by mouth every 6 (six) hours as needed for mild pain (or Fever >/= 101). 01/01/19   Roxan Hockey, MD  allopurinol (ZYLOPRIM) 300 MG tablet Take 1 tablet (300 mg total) by mouth daily. 12/20/19   [name], [name], MD  atorvastatin (LIPITOR) 20 MG tablet TAKE 1 TABLET BY MOUTH EVERY DAY 03/19/20   [name], [name], MD  azelastine (OPTIVAR) 0.05 % ophthalmic solution PLACE 2 DROPS INTO BOTH EYES 2 (TWO) TIMES DAILY. 07/30/18   [name], [name], MD  benzonatate (TESSALON) 100 MG capsule TAKE 1 CAPSULE BY MOUTH TWICE A DAY 12/31/19   [name], [name], MD  benzonatate (TESSALON) 200 MG capsule Take 1 capsule (200 mg total) by mouth 2 (two) times daily. 12/20/19   [name], [name], MD  Dulaglutide (TRULICITY) 1.5 AS/5.0NL SOPN Inject 1.5 mg into the skin once a week. 12/20/19   [name], [name], MD  FARXIGA 5 MG TABS tablet Take 5 mg by mouth daily. 01/26/19   [provider]  ferrous sulfate 325 (65 FE) MG tablet Take 325 mg by mouth daily with breakfast.     [provider]  glucose blood (CONTOUR NEXT TEST) test strip Test BS TID Dx E11.29 05/22/20   [name], [name], MD  insulin aspart  (NOVOLOG FLEXPEN) 100 UNIT/ML FlexPen insulin aspart (novoLOG) injection 0-9 Units 0-9 Units, Subcutaneous, 3 times daily with meals, CBG < 70: Implement Hypoglycemia Standing Orders and refer to Hypoglycemia Standing Orders sidebar report CBG 70 - 120: 0 units CBG 121 - 150: 1 unit CBG 151 - 200: 2 units CBG 201 - 250: 3 units CBG 251 - 300: 5 units CBG 301 - 350: 7 units CBG 351 - 400: 9 units CBG > 400 12/20/19   [name], [name], MD  insulin degludec (TRESIBA FLEXTOUCH) 100 UNIT/ML FlexTouch  Pen Inject 24 Units into the skin at bedtime. 03/19/20   [name], [name], MD  Insulin Pen Needle 32G X 4 MM MISC Use to give insulin daily Dx E11.29 04/15/19   Charlynne Cousins, MD  KLOR-CON M20 20 MEQ tablet TAKE 1/2 TABLET (10 MEQ TOTAL) BY MOUTH DAILY. NEED OFFICE VISIT 06/02/20   [name], [name], MD  losartan (COZAAR) 25 MG tablet TAKE 1 TABLET BY MOUTH EVERY DAY 05/07/20   Larey Dresser, MD  metoprolol succinate (TOPROL-XL) 50 MG 24 hr tablet TAKE 1 TABLET (50 MG TOTAL) BY MOUTH DAILY. TAKE WITH OR IMMEDIATELY FOLLOWING A MEAL. 05/25/20   Larey Dresser, MD  pantoprazole (PROTONIX) 40 MG tablet TAKE 1 TABLET BY MOUTH EVERY DAY 06/02/20   [name], [name], MD  polyethylene glycol (MIRALAX / GLYCOLAX) 17 g packet Take 17 g by mouth daily. 01/01/19   Roxan Hockey, MD  sildenafil (REVATIO) 20 MG tablet Take 1 tablet (20 mg total) by mouth 3 (three) times daily. 06/13/19   Larey Dresser, MD  torsemide (DEMADEX) 20 MG tablet Take 3 tablets (60 mg total) by mouth in the morning AND 2 tablets (40 mg total) every evening. 05/25/20   Larey Dresser, MD  warfarin (COUMADIN) 5 MG tablet Take 1 tablet (5 mg total) by mouth every evening. 12/20/19   [name], [name], MD  diltiazem [facility]) 240 MG 24 hr capsule TAKE 1 CAPSULE DAILY 10/19/12 02/17/13  Vernie Shanks, MD    Allergies    Codeine  Review of Systems   Review of Systems  Constitutional:  Negative for fever.  Respiratory:   Negative for shortness of breath.   Cardiovascular:  Positive for chest pain (from compressions).  Neurological:  Positive for light-headedness.  All other systems reviewed and are negative.  Physical Exam Updated Vital Signs BP 112/76   Pulse 81   Temp 98.5 F (36.9 C) (Oral)   Resp (!) 23   Ht _0  (1.6 m)   Wt 120.2 kg   SpO2 94%   BMI 46.94 kg/m   Physical Exam Vitals and nursing note reviewed.  Constitutional:      Appearance: He is obese. He is not ill-appearing or diaphoretic.  HENT:     Head: Normocephalic and atraumatic.  Eyes:     Extraocular Movements: Extraocular movements intact.     Conjunctiva/sclera: Conjunctivae normal.     Pupils: Pupils are equal, round, and reactive to light.  Cardiovascular:     Rate and Rhythm: Normal rate and regular rhythm.     Pulses: Normal pulses.  Pulmonary:     Effort: Pulmonary effort is normal.     Breath sounds: Normal breath sounds. No wheezing, rhonchi or rales.  Abdominal:     Palpations: Abdomen is soft.     Tenderness: There is no abdominal tenderness. There is no guarding or rebound.  Musculoskeletal:     Cervical back: Neck supple.  Skin:    General: Skin is warm and dry.  Neurological:     Mental Status: He is alert and oriented to person, place, and time.    ED Results / Procedures / Treatments   Labs (all labs ordered are listed, but only abnormal results are displayed) Labs Reviewed  COMPREHENSIVE METABOLIC PANEL - Abnormal; Notable for the following components:      Result Value   Potassium 3.1 (*)    Chloride 113 (*)    CO2 20 (*)    Glucose,  Bld 153 (*)    BUN 39 (*)    Creatinine, Ser 1.77 (*)    Calcium 7.0 (*)    Total Protein 4.7 (*)    Albumin 2.4 (*)    AST 13 (*)    GFR, Estimated 41 (*)    All other components within normal limits  CBC WITH DIFFERENTIAL/PLATELET - Abnormal; Notable for the following components:   RBC 3.15 (*)    Hemoglobin 9.0 (*)    HCT 30.6 (*)    MCHC 29.4 (*)     RDW 16.7 (*)    Platelets 86 (*)    Lymphs Abs 0.4 (*)    All other components within normal limits  PROTIME-INR - Abnormal; Notable for the following components:   Prothrombin Time 36.2 (*)    INR 3.6 (*)    All other components within normal limits  BRAIN NATRIURETIC PEPTIDE - Abnormal; Notable for the following components:   B Natriuretic Peptide 308.6 (*)    All other components within normal limits  CBG MONITORING, ED - Abnormal; Notable for the following components:   Glucose-Capillary 170 (*)    All other components within normal limits  POC OCCULT BLOOD, ED - Abnormal; Notable for the following components:   Fecal Occult Bld POSITIVE (*)    All other components within normal limits  SARS CORONAVIRUS 2 (TAT 6-24 HRS)  URINALYSIS, ROUTINE W REFLEX MICROSCOPIC  MAGNESIUM  CALCIUM, IONIZED  TROPONIN I (HIGH SENSITIVITY)  TROPONIN I (HIGH SENSITIVITY)    EKG EKG Interpretation  Date/Time:  Friday June 19 2020 15:11:48 EDT Ventricular Rate:  83 PR Interval:    QRS Duration: 104 QT Interval:  419 QTC Calculation: 493 R Axis:   114 Text Interpretation: Atrial fibrillation Right axis deviation Low voltage, precordial leads Abnormal lateral Q waves Anteroseptal infarct, old No significant change since last tracing Confirmed by Calvert Cantor [phone]) on 06/19/2020 4:36:53 PM  Radiology DG Chest Port 1 View  Result Date: 06/19/2020 CLINICAL DATA:  Unresponsive chest pain EXAM: PORTABLE CHEST 1 VIEW COMPARISON:  04/10/2019 FINDINGS: Cardiomegaly with vascular congestion and mild interstitial edema. Probable small left effusion. Aortic atherosclerosis. No pneumothorax. IMPRESSION: Cardiomegaly with vascular congestion, mild interstitial pulmonary edema and probable small left effusion Electronically Signed   By: Donavan Foil M.D.   On: 06/19/2020 16:25    Procedures Procedures   Medications Ordered in ED Medications  potassium chloride 10 mEq in 100 mL IVPB (has no  administration in time range)  calcium gluconate 1 g/ 50 mL sodium chloride IVPB (has no administration in time range)  furosemide (LASIX) injection 40 mg (has no administration in time range)  acetaminophen (TYLENOL) tablet 1,000 mg (1,000 mg Oral Given 06/19/20 1606)    ED Course  I have reviewed the triage vital signs and the nursing notes.  Pertinent labs & imaging results that were available during my care of the patient were reviewed by me and considered in my medical decision making (see chart for details).    MDM Rules/Calculators/A&P                          70 year old male presenting to the ED today for possible cardiac arrest vs syncopal episode that occurred at [facility] office today. ROSC was obtained within 1 minute of chest compressions and no shock recommended per AED. Pt currently complains of chest pain from the compressions. Denies any prodrome besides lightheadedness. On arrival to the  ED today pt is afebrile, nontachycardic, and nontachypneic. He is on his baseline 2L O2 satting 93-94%. Will workup with labs at this time and likely discuss case with cardiology. Discussed case with attending physician Dr. Karle Starch who agrees with plan.   EKG with A fib; known for patient.  CXR  Cardiomegaly with vascular congestion, mild interstitial pulmonary  edema and probable small left effusion  BNP added on at this time given CXR findings  INR 23.6 today CBC without leukocytosis. Hgb 9.0. Platelets of 86 (previous 100)  Hemoglobin  Date Value Ref Range Status  06/19/2020 9.0 (L) 13.0 - 17.0 g/dL Final  03/19/2020 12.2 (L) 13.0 - 17.7 g/dL Final  12/20/2019 13.5 13.0 - 17.7 g/dL Final  07/25/2019 14.8 13.0 - 17.7 g/dL Final  04/26/2019 10.7 (L) 13.0 - 17.7 g/dL Final   Fecal occult positive; stool light brown and non-melanotic.   CMP with potassium 3.1. Calcium 7.0. Creatinine stable at 1.77 however BUN slightly elevated 39. Bicarb and chloride slightly low. Orthostatics  negative; question mild dehydration however suspect pt's anemia caused his symptoms today. Awaiting BNP prior to fluids. Pt will require admission given anemia and positive symptoms.   Discussed case with Hospitalist who agrees to accept patient for admission.   This note was prepared using Dragon voice recognition software and may include unintentional dictation errors due to the inherent limitations of voice recognition software.  Final Clinical Impression(s) / ED Diagnoses Final diagnoses:  Syncope and collapse  Hypokalemia  Hypocalcemia  Symptomatic anemia  Fecal occult blood test positive  Acute on chronic congestive heart failure, unspecified heart failure type ([place])  Supratherapeutic INR    Rx / DC Orders ED Discharge Orders     None        Eustaquio Maize, PA-C 06/19/20 1831    Truddie Hidden, MD 06/19/20 [place]

## 2020-06-19 NOTE — H&P (Signed)
History and Physical        Hospital Admission Note Date: 06/19/2020  Patient name: William Flores Medical record number: [id number] Date of birth: 11/04/1950 Age: 70 y.o. Gender: male  PCP: [name], [name], MD    Patient coming from: [place] via [facility]   I have reviewed all records in the [facility].    Chief Complaint:  LOC  HPI: William Flores is a 70 y.o. male with PMH of HTN, T2DM, CKD III, Afib on Coumadin, HLD, HFpEF who presents to ED via EMS for syncopal episode versus cardiac arrest. Presented to his routine scheduled PCP visit. Reports when checking in he felt generalized weakness and lightheadedness. Had previously felt fine prior to arrival. Next thing he remembers is waking up in the ambulance.   Per chart review: Pt was found starting to lose consciousness per the nurses in the lobby and code was called. He was found to be unconscious by the time the physicians arrived and he was lowered to the floor and chest compressions were started. Within a minute pt regained consciousness and ROSC was obtained. AED pads were placed on his chest and analyzed; no shock warranted. Pt then transferred to the ED for further evaluation.  Reports now has chest pain from compressions but denies other chest pain. No SOB or cough. On O2 2L at baseline, has not had to adjust.   He is on Coumadin for Afib. Reports takes one table daily expect Monday when he takes one and a half. He has been compliant with this regimen. Checks INR at home and says was 2.0 on Tues. He denies blood in stool. Reports compliance with Fe and says does have black stools related to this.    ED work-up/course:    70 year old male presenting to the ED today for possible cardiac arrest vs syncopal episode that occurred at [facility] office today. ROSC was obtained within 1 minute of chest compressions and no shock recommended per AED. Pt currently  complains of chest pain from the compressions. Denies any prodrome besides lightheadedness. On arrival to the ED today pt is afebrile, nontachycardic, and nontachypneic. He is on his baseline 2L O2 satting 93-94%. Will workup with labs at this time and likely discuss case with cardiology. Discussed case with attending physician Dr. Karle Starch who agrees with plan.   EKG with A fib; known for patient. CXR  Cardiomegaly with vascular congestion, mild interstitial pulmonary  edema and probable small left effusion  BNP added on at this time given CXR findings   INR 3.6 today CBC without leukocytosis. Hgb 9.0. Platelets of 86 (previous 100)  Fecal occult positive; stool light brown and non-melanotic.   CMP with potassium 3.1. Calcium 7.0. Creatinine stable at 1.77 however BUN slightly elevated 39. Bicarb and chloride slightly low. Orthostatics negative; question mild dehydration however suspect pt's anemia caused his symptoms today. Awaiting BNP prior to fluids. Pt will require admission given anemia and positive symptoms.   Discussed case with Hospitalist who agrees to accept patient for admission.    Review of Systems: Positives marked in 'bold' Constitutional: Denies fever, chills, diaphoresis, poor appetite and fatigue.  HEENT:  Denies photophobia, eye pain, redness, hearing loss, ear pain, congestion, sore throat, rhinorrhea, sneezing, mouth sores, trouble swallowing, neck pain, neck stiffness and tinnitus.   Respiratory: Denies SOB, DOE, cough, chest tightness,  and wheezing.   Cardiovascular: Denies chest wall pain, palpitations and leg swelling.  Gastrointestinal: Denies nausea, vomiting, abdominal pain, diarrhea, constipation, blood in stool and abdominal distention.  Genitourinary: Denies dysuria, urgency, frequency, hematuria, flank pain and difficulty urinating.  Musculoskeletal: Denies myalgias, back pain, joint swelling, arthralgias and gait problem.  Skin: Denies pallor, rash and  wound.  Neurological: Denies dizziness, seizures, syncope, weakness, light-headedness, numbness and headaches.  Hematological: Denies adenopathy. Easy bruising, personal or family bleeding history  Psychiatric/Behavioral: Denies suicidal ideation, mood changes, confusion, nervousness, sleep disturbance and agitation  Past Medical History: Past Medical History:  Diagnosis Date   CAD (coronary artery disease)    LHC 2/08:  mRCA 50%, o/w no sig CAD, EF 25%   Cardiomyopathy ([place])    EF 35-40% in 2/16 in setting of AF with RVR >> echo 5/16 with improved LVF with EF 65-70%   Chronic atrial fibrillation (HCC)    Chronic diastolic heart failure (HCC)    HFPF - EF ~65-70% (OFTEN EXACERBATED BY AFIB)   CKD (chronic kidney disease)    hx of worsening renal failure and hyperK+ in setting of acute diast CHF >> required CVVHD in 4/16 and dialysis x 1 in 5/16   CKD stage 3 due to type 2 diabetes mellitus ([place]) 09/17/2015   Colon cancer ([place])    Colon polyps 09/21/2012   Tubular adenoma   Diabetes ([place])    History of echocardiogram    Echo 05/16/14:  mild LVH, vigorous LVF, EF 65-70%, no RWMA, mean AV gradient 9 mmHg, MAC, mild MR, mod LAE, mild RVE, mild reduced RVF, severe RAE   Hyperlipidemia    Hypertension    Obesity hypoventilation syndrome ([place])    Renal insufficiency    Sleep apnea     Past Surgical History:  Procedure Laterality Date   CIRCUMCISION     COLON RESECTION     COLONOSCOPY N/A 09/21/2012   Procedure: COLONOSCOPY;  Surgeon: Inda Castle, MD;  Location: [facility] ENDOSCOPY;  Service: Endoscopy;  Laterality: N/A;   COLONOSCOPY N/A 12/28/2018   Procedure: COLONOSCOPY;  Surgeon: Rogene Houston, MD;  Location: [facility] ENDO SUITE;  Service: Endoscopy;  Laterality: N/A;   ESOPHAGOGASTRODUODENOSCOPY N/A 12/28/2018   Procedure: ESOPHAGOGASTRODUODENOSCOPY (EGD);  Surgeon: Rogene Houston, MD;  Location: [facility] END[facility] SU[facility];  Service: Endoscopy;  Laterality: N/A;   RIGHT HEART CATH N/A 04/11/2019    Procedure: RIGHT HEART CATH;  Surgeon: Larey Dresser, MD;  Location: [place] CV LAB;  Service: Cardiovascular;  Laterality: N/A;   US ECHOCARDIOGRAPHY  03/04/2010   abnormal study    Medications: Prior to Admission medications   Medication Sig Start Date End Date Taking? Authorizing Provider  acetaminophen (TYLENOL) 325 MG tablet Take 2 tablets (650 mg total) by mouth every 6 (six) hours as needed for mild pain (or Fever >/= 101). 01/01/19   Roxan Hockey, MD  allopurinol (ZYLOPRIM) 300 MG tablet Take 1 tablet (300 mg total) by mouth daily. 12/20/19   [name], [name], MD  atorvastatin (LIPITOR) 20 MG tablet TAKE 1 TABLET BY MOUTH EVERY DAY 03/19/20   [name], [name], MD  azelastine (OPTIVAR) 0.05 % ophthalmic solution PLACE 2 DROPS INTO BOTH EYES 2 (TWO) TIMES DAILY. 07/30/18   [name], [name], MD  benzonatate (TESSALON) 100  MG capsule TAKE 1 CAPSULE BY MOUTH TWICE A DAY 12/31/19   [name], [name], MD  benzonatate (TESSALON) 200 MG capsule Take 1 capsule (200 mg total) by mouth 2 (two) times daily. 12/20/19   [name], [name], MD  Dulaglutide (TRULICITY) 1.5 AQ/7.6AU SOPN Inject 1.5 mg into the skin once a week. 12/20/19   [name], [name], MD  FARXIGA 5 MG TABS tablet Take 5 mg by mouth daily. 01/26/19   [provider]  ferrous sulfate 325 (65 FE) MG tablet Take 325 mg by mouth daily with breakfast.     [provider]  glucose blood (CONTOUR NEXT TEST) test strip Test BS TID Dx E11.29 05/22/20   [name], [name], MD  insulin aspart (NOVOLOG FLEXPEN) 100 UNIT/ML FlexPen insulin aspart (novoLOG) injection 0-9 Units 0-9 Units, Subcutaneous, 3 times daily with meals, CBG < 70: Implement Hypoglycemia Standing Orders and refer to Hypoglycemia Standing Orders sidebar report CBG 70 - 120: 0 units CBG 121 - 150: 1 unit CBG 151 - 200: 2 units CBG 201 - 250: 3 units CBG 251 - 300: 5 units CBG 301 - 350: 7 units CBG 351 - 400: 9 units CBG > 400 12/20/19    [name], [name], MD  insulin degludec (TRESIBA FLEXTOUCH) 100 UNIT/ML FlexTouch Pen Inject 24 Units into the skin at bedtime. 03/19/20   [name], [name], MD  Insulin Pen Needle 32G X 4 MM MISC Use to give insulin daily Dx E11.29 04/15/19   Charlynne Cousins, MD  KLOR-CON M20 20 MEQ tablet TAKE 1/2 TABLET (10 MEQ TOTAL) BY MOUTH DAILY. NEED OFFICE VISIT 06/02/20   [name], [name], MD  losartan (COZAAR) 25 MG tablet TAKE 1 TABLET BY MOUTH EVERY DAY 05/07/20   Larey Dresser, MD  metoprolol succinate (TOPROL-XL) 50 MG 24 hr tablet TAKE 1 TABLET (50 MG TOTAL) BY MOUTH DAILY. TAKE WITH OR IMMEDIATELY FOLLOWING A MEAL. 05/25/20   Larey Dresser, MD  pantoprazole (PROTONIX) 40 MG tablet TAKE 1 TABLET BY MOUTH EVERY DAY 06/02/20   [name], [name], MD  polyethylene glycol (MIRALAX / GLYCOLAX) 17 g packet Take 17 g by mouth daily. 01/01/19   Roxan Hockey, MD  sildenafil (REVATIO) 20 MG tablet Take 1 tablet (20 mg total) by mouth 3 (three) times daily. 06/13/19   Larey Dresser, MD  torsemide (DEMADEX) 20 MG tablet Take 3 tablets (60 mg total) by mouth in the morning AND 2 tablets (40 mg total) every evening. 05/25/20   Larey Dresser, MD  warfarin (COUMADIN) 5 MG tablet Take 1 tablet (5 mg total) by mouth every evening. 12/20/19   [name], [name], MD  diltiazem [facility]) 240 MG 24 hr capsule TAKE 1 CAPSULE DAILY 10/19/12 02/17/13  Vernie Shanks, MD    Allergies:   Allergies  Allergen Reactions   Codeine     Headache     Social History:  reports that he quit smoking about 36 years ago. He has never used smokeless tobacco. He reports that he does not drink alcohol and does not use drugs.  Family History: Family History  Problem Relation Age of Onset   Breast cancer Mother    Diabetes Mother    Heart attack Father     Physical Exam: Blood pressure (!) 131/91, pulse 90, temperature 98.5 F (36.9 C), temperature source Oral, resp. rate (!) 22, height _0  (1.6 m),  weight 120.2 kg, SpO2 95 %. General: Alert, awake, oriented x3,  in no acute distress. Eyes: pink conjunctiva,anicteric sclera, pupils equal and reactive to light and accomodation, HEENT: normocephalic, atraumatic, oropharynx clear Neck: supple, no masses or lymphadenopathy, no goiter, no bruits, no JVD CVS: Irregularly regular without murmurs, rubs or gallops. 1-2+ edema to mid shins bilaterally.  Resp : Clear to auscultation bilaterally, no wheezing, rales or rhonchi. GI : Soft, nontender, nondistended, positive bowel sounds, no masses. No hepatomegaly. No hernia.  Musculoskeletal: No clubbing or cyanosis, positive pedal pulses. No contracture. ROM intact  Neuro: Grossly intact, no focal neurological deficits, strength 5/5 upper and lower extremities bilaterally Psych: alert and oriented x 3, normal mood and affect Skin: Chronic venous stasis changes present for LE.   LABS on Admission: I have personally reviewed all the labs and imagings below    Basic Metabolic Panel: Recent Labs  Lab 06/19/20 1550  NA 141  K 3.1*  CL 113*  CO2 20*  GLUCOSE 153*  BUN 39*  CREATININE 1.77*  CALCIUM 7.0*   Liver Function Tests: Recent Labs  Lab 06/19/20 1550  AST 13*  ALT 11  ALKPHOS 49  BILITOT 0.7  PROT 4.7*  ALBUMIN 2.4*   No results for input(s): LIPASE, AMYLASE in the last 168 hours. No results for input(s): AMMONIA in the last 168 hours. CBC: Recent Labs  Lab 06/19/20 1550  WBC 5.3  NEUTROABS 4.4  HGB 9.0*  HCT 30.6*  MCV 97.1  PLT 86*   Cardiac Enzymes: No results for input(s): CKTOTAL, CKMB, CKMBINDEX, TROPONINI in the last 168 hours. BNP: Invalid input(s): POCBNP CBG: Recent Labs  Lab 06/19/20 1515  GLUCAP 170*    Radiological Exams on Admission:  DG Chest Port 1 View  Result Date: 06/19/2020 CLINICAL DATA:  Unresponsive chest pain EXAM: PORTABLE CHEST 1 VIEW COMPARISON:  04/10/2019 FINDINGS: Cardiomegaly with vascular congestion and mild interstitial  edema. Probable small left effusion. Aortic atherosclerosis. No pneumothorax. IMPRESSION: Cardiomegaly with vascular congestion, mild interstitial pulmonary edema and probable small left effusion Electronically Signed   By: Donavan Foil M.D.   On: 06/19/2020 16:25      EKG: Independently reviewed. Afib.    Assessment/Plan Active Problems:   Hypertensive heart disease   History of colon cancer   Morbid obesity (HCC)   Right heart failure (HCC)   OSA (obstructive sleep apnea)   Nonischemic cardiomyopathy (HCC)   Anticoagulated on Coumadin   Atrial fibrillation with slow ventricular response (HCC)   DM (diabetes mellitus), type 2, uncontrolled, with renal complications (HCC)   Normocytic anemia   CKD stage 3 due to type 2 diabetes mellitus ([place])   Hyperlipidemia associated with type 2 diabetes mellitus ([place])   Pulmonary hypertension ([place])   Syncope   Hypokalemia   Fecal occult blood test positive  Syncope versus Cardiac Arrest  Story sounds much more concerning for syncope. Initial troponin neg. EKG shows known Afib without acute ST segment changes. Orthostatic vital signs negative. No signs of infection. Noted to have HgB 9.0 down from 12.2 in March. This may be related to symptomatic anemia.  -admit to observation, telemetry  -obtain echo  -repeat CBC in AM, work up for anemia as below  -cycle troponins  -repeat EKG in AM  -obtain UDS   Normocytic Anemia/Thrombocytopenia  HgB 9.0. MCV 97. Platelets 86 (appears to have baseline of around low 100s). Does have some evidence of volume overload on CXR and exam so this may be component of hemodilution as most cell lines are down, although WBCs stable. FOBT+  in ED. Compliant with Fe supplements. Reports no hematochezia or other episodes of bleeding. He had colonoscopy in 2020 with multiple colonic polyps removed and was instructed to return in 6 months. Does not appear this occurred and patient reports that was his last colonoscopy. Has  a history of colon cancer.  -repeat CBC in AM  -obtain iron studies, B12, folate -will hold on RBC transfusion given still above transfusion threshold, pending Fe results may benefit from feraheme -will need follow up with GI for colonoscopy  -hold anticoagulation   Acute on Chronic CHF  Appears to have mild volume overload. CXR: Cardiomegaly with vascular congestion, mild interstitial pulmonary edema and probable small left effusion. BNP 308. Does appear to typically have elevated BNP in upper 200s. Echo March 2021 with EF 50-55% but severely impaired right ventricular function. Given Lasix 40 mg IV in ED. On home O2.  -monitor volume status -hold home Torsemide, determine if should be resumed tomorrow versus further IV diuresis  -continue home O2, monitor O2 saturation  -strict I/Os -daily weights  -repeat echo   Afib on Coumadin with Supratherapeutic INR  INR 3.6 at admission. Target range 2-3.  -hold coumadin -repeat INR in AM   Hypokalemia  K 3.1. Given 10 mEq IV in ED.  -give additional Kdur 20 mEq x2 doses  -monitor BMET  HTN  BP stable  -continue home Metoprolol and Losartan   T2DM  Last A1c on record 6.9 in March 2021. Glucose 153. On Tresiba 24u daily, Novolog SSI, Trulicity weekly, and Farxiga.  -Lantus 20u -moderate SSI  -CBGs AC/HS -A1c   CKD Stage III Cr 1.77. GFR 41. At baseline.  -monitor daily  -avoid nephrotoxic agents    OSA  -CPAP qhs    DVT prophylaxis: SCDs. Hold Coumadin due to supra- therapeutic INR.   CODE STATUS: FULL   Consults called: None    Admission status: Observation   The medical decision making on this patient was of high complexity and the patient is at high risk for clinical deterioration, therefore this is a level 3 admission.  Severity of Illness:      The appropriate patient status for this patient is OBSERVATION. Observation status is judged to be reasonable and necessary in order to provide the required intensity of  service to ensure the patient's safety. The patient's presenting symptoms, physical exam findings, and initial radiographic and laboratory data in the context of their medical condition is felt to place them at decreased risk for further clinical deterioration. Furthermore, it is anticipated that the patient will be medically stable for discharge from the hospital within 2 midnights of admission. The following factors support the patient status of observation.   " The patient's presenting symptoms include lightheadedness and LOC. " The physical exam findings include LE edema. " The initial radiographic and laboratory data are CXR with pulmonary edema and cardiomegaly, Hgb 9, K 3.1.   Time Spent on Admission: 42 minutes      Melina Schools D.O.  [facility] 06/19/2020, 6:58 PM

## 2020-06-19 NOTE — Progress Notes (Signed)
Patient was found starting to lose consciousness per the nurses in the lobby and code was called.  He was found to be unconscious by the time the physicians arrived and we lowered him down to the floor and chest compressions were started.  Within a minute patient regained consciousness and ROSC was obtained.  911 was called and EMS arrived around the same time that ROSC was obtained.  Pads were placed on his chest from the AED and analyzed and no shock was warranted.  At this time patient was conscious but not fully alert but was responding and pulse was returned peripherally and respirations were intact.  Patient was placed back onto his 2 L nasal cannula and information was passed to EMS for transport. Caryl Pina, MD [facility] 06/19/2020, 2:06 PM

## 2020-06-20 ENCOUNTER — Encounter (HOSPITAL_COMMUNITY): Payer: Self-pay | Admitting: Internal Medicine

## 2020-06-20 ENCOUNTER — Observation Stay (HOSPITAL_COMMUNITY): Payer: Medicare Other

## 2020-06-20 DIAGNOSIS — N1831 Chronic kidney disease, stage 3a: Secondary | ICD-10-CM | POA: Diagnosis present

## 2020-06-20 DIAGNOSIS — I110 Hypertensive heart disease with heart failure: Secondary | ICD-10-CM | POA: Diagnosis not present

## 2020-06-20 DIAGNOSIS — E1122 Type 2 diabetes mellitus with diabetic chronic kidney disease: Secondary | ICD-10-CM | POA: Diagnosis not present

## 2020-06-20 DIAGNOSIS — I428 Other cardiomyopathies: Secondary | ICD-10-CM | POA: Diagnosis not present

## 2020-06-20 DIAGNOSIS — Z7401 Bed confinement status: Secondary | ICD-10-CM | POA: Diagnosis not present

## 2020-06-20 DIAGNOSIS — I50810 Right heart failure, unspecified: Secondary | ICD-10-CM | POA: Diagnosis not present

## 2020-06-20 DIAGNOSIS — E1169 Type 2 diabetes mellitus with other specified complication: Secondary | ICD-10-CM | POA: Diagnosis not present

## 2020-06-20 DIAGNOSIS — Z7901 Long term (current) use of anticoagulants: Secondary | ICD-10-CM | POA: Diagnosis not present

## 2020-06-20 DIAGNOSIS — D508 Other iron deficiency anemias: Secondary | ICD-10-CM | POA: Diagnosis not present

## 2020-06-20 DIAGNOSIS — I5042 Chronic combined systolic (congestive) and diastolic (congestive) heart failure: Secondary | ICD-10-CM | POA: Diagnosis not present

## 2020-06-20 DIAGNOSIS — R55 Syncope and collapse: Principal | ICD-10-CM

## 2020-06-20 DIAGNOSIS — N183 Chronic kidney disease, stage 3 unspecified: Secondary | ICD-10-CM | POA: Diagnosis not present

## 2020-06-20 DIAGNOSIS — Z6841 Body Mass Index (BMI) 40.0 and over, adult: Secondary | ICD-10-CM | POA: Diagnosis not present

## 2020-06-20 DIAGNOSIS — J9811 Atelectasis: Secondary | ICD-10-CM | POA: Diagnosis present

## 2020-06-20 DIAGNOSIS — D500 Iron deficiency anemia secondary to blood loss (chronic): Secondary | ICD-10-CM

## 2020-06-20 DIAGNOSIS — I469 Cardiac arrest, cause unspecified: Secondary | ICD-10-CM | POA: Diagnosis not present

## 2020-06-20 DIAGNOSIS — R231 Pallor: Secondary | ICD-10-CM | POA: Diagnosis not present

## 2020-06-20 DIAGNOSIS — E1165 Type 2 diabetes mellitus with hyperglycemia: Secondary | ICD-10-CM | POA: Diagnosis not present

## 2020-06-20 DIAGNOSIS — Z01818 Encounter for other preprocedural examination: Secondary | ICD-10-CM | POA: Diagnosis not present

## 2020-06-20 DIAGNOSIS — I50812 Chronic right heart failure: Secondary | ICD-10-CM | POA: Diagnosis not present

## 2020-06-20 DIAGNOSIS — E1129 Type 2 diabetes mellitus with other diabetic kidney complication: Secondary | ICD-10-CM | POA: Diagnosis not present

## 2020-06-20 DIAGNOSIS — I5030 Unspecified diastolic (congestive) heart failure: Secondary | ICD-10-CM | POA: Diagnosis not present

## 2020-06-20 DIAGNOSIS — I340 Nonrheumatic mitral (valve) insufficiency: Secondary | ICD-10-CM | POA: Diagnosis not present

## 2020-06-20 DIAGNOSIS — D649 Anemia, unspecified: Secondary | ICD-10-CM | POA: Diagnosis not present

## 2020-06-20 DIAGNOSIS — I248 Other forms of acute ischemic heart disease: Secondary | ICD-10-CM | POA: Diagnosis present

## 2020-06-20 DIAGNOSIS — I4891 Unspecified atrial fibrillation: Secondary | ICD-10-CM | POA: Diagnosis not present

## 2020-06-20 DIAGNOSIS — I482 Chronic atrial fibrillation, unspecified: Secondary | ICD-10-CM | POA: Diagnosis present

## 2020-06-20 DIAGNOSIS — I2781 Cor pulmonale (chronic): Secondary | ICD-10-CM | POA: Diagnosis present

## 2020-06-20 DIAGNOSIS — K922 Gastrointestinal hemorrhage, unspecified: Secondary | ICD-10-CM | POA: Diagnosis not present

## 2020-06-20 DIAGNOSIS — J449 Chronic obstructive pulmonary disease, unspecified: Secondary | ICD-10-CM | POA: Diagnosis not present

## 2020-06-20 DIAGNOSIS — K2950 Unspecified chronic gastritis without bleeding: Secondary | ICD-10-CM | POA: Diagnosis not present

## 2020-06-20 DIAGNOSIS — N184 Chronic kidney disease, stage 4 (severe): Secondary | ICD-10-CM | POA: Diagnosis not present

## 2020-06-20 DIAGNOSIS — E876 Hypokalemia: Secondary | ICD-10-CM | POA: Diagnosis not present

## 2020-06-20 DIAGNOSIS — R791 Abnormal coagulation profile: Secondary | ICD-10-CM | POA: Diagnosis not present

## 2020-06-20 DIAGNOSIS — J9611 Chronic respiratory failure with hypoxia: Secondary | ICD-10-CM | POA: Diagnosis present

## 2020-06-20 DIAGNOSIS — R195 Other fecal abnormalities: Secondary | ICD-10-CM

## 2020-06-20 DIAGNOSIS — I509 Heart failure, unspecified: Secondary | ICD-10-CM | POA: Diagnosis not present

## 2020-06-20 DIAGNOSIS — I5082 Biventricular heart failure: Secondary | ICD-10-CM | POA: Diagnosis not present

## 2020-06-20 DIAGNOSIS — E662 Morbid (severe) obesity with alveolar hypoventilation: Secondary | ICD-10-CM | POA: Diagnosis not present

## 2020-06-20 DIAGNOSIS — Z9981 Dependence on supplemental oxygen: Secondary | ICD-10-CM | POA: Diagnosis not present

## 2020-06-20 DIAGNOSIS — R079 Chest pain, unspecified: Secondary | ICD-10-CM | POA: Diagnosis not present

## 2020-06-20 DIAGNOSIS — R5381 Other malaise: Secondary | ICD-10-CM | POA: Diagnosis not present

## 2020-06-20 DIAGNOSIS — I2729 Other secondary pulmonary hypertension: Secondary | ICD-10-CM | POA: Diagnosis not present

## 2020-06-20 DIAGNOSIS — M6281 Muscle weakness (generalized): Secondary | ICD-10-CM | POA: Diagnosis not present

## 2020-06-20 DIAGNOSIS — I272 Pulmonary hypertension, unspecified: Secondary | ICD-10-CM | POA: Diagnosis not present

## 2020-06-20 DIAGNOSIS — Z20822 Contact with and (suspected) exposure to covid-19: Secondary | ICD-10-CM | POA: Diagnosis not present

## 2020-06-20 DIAGNOSIS — Z85038 Personal history of other malignant neoplasm of large intestine: Secondary | ICD-10-CM | POA: Diagnosis not present

## 2020-06-20 DIAGNOSIS — R41841 Cognitive communication deficit: Secondary | ICD-10-CM | POA: Diagnosis not present

## 2020-06-20 DIAGNOSIS — R2689 Other abnormalities of gait and mobility: Secondary | ICD-10-CM | POA: Diagnosis not present

## 2020-06-20 DIAGNOSIS — K2951 Unspecified chronic gastritis with bleeding: Secondary | ICD-10-CM | POA: Diagnosis not present

## 2020-06-20 DIAGNOSIS — N179 Acute kidney failure, unspecified: Secondary | ICD-10-CM | POA: Diagnosis not present

## 2020-06-20 DIAGNOSIS — D696 Thrombocytopenia, unspecified: Secondary | ICD-10-CM | POA: Diagnosis not present

## 2020-06-20 DIAGNOSIS — G4733 Obstructive sleep apnea (adult) (pediatric): Secondary | ICD-10-CM | POA: Diagnosis not present

## 2020-06-20 DIAGNOSIS — D62 Acute posthemorrhagic anemia: Secondary | ICD-10-CM | POA: Diagnosis not present

## 2020-06-20 DIAGNOSIS — J90 Pleural effusion, not elsewhere classified: Secondary | ICD-10-CM | POA: Diagnosis not present

## 2020-06-20 DIAGNOSIS — I130 Hypertensive heart and chronic kidney disease with heart failure and stage 1 through stage 4 chronic kidney disease, or unspecified chronic kidney disease: Secondary | ICD-10-CM | POA: Diagnosis not present

## 2020-06-20 LAB — BASIC METABOLIC PANEL
Anion gap: 10 (ref 5–15)
BUN: 49 mg/dL — ABNORMAL HIGH (ref 8–23)
CO2: 22 mmol/L (ref 22–32)
Calcium: 9.2 mg/dL (ref 8.9–10.3)
Chloride: 108 mmol/L (ref 98–111)
Creatinine, Ser: 2.23 mg/dL — ABNORMAL HIGH (ref 0.61–1.24)
GFR, Estimated: 31 mL/min — ABNORMAL LOW (ref >60)
Glucose, Bld: 166 mg/dL — ABNORMAL HIGH (ref 70–99)
Potassium: 4.3 mmol/L (ref 3.5–5.1)
Sodium: 140 mmol/L (ref 135–145)

## 2020-06-20 LAB — CBC
HCT: 34.8 % — ABNORMAL LOW (ref 39.0–52.0)
Hemoglobin: 10.3 g/dL — ABNORMAL LOW (ref 13.0–17.0)
MCH: 28.6 pg (ref 26.0–34.0)
MCHC: 29.6 g/dL — ABNORMAL LOW (ref 30.0–36.0)
MCV: 96.7 fL (ref 80.0–100.0)
Platelets: 121 10*3/uL — ABNORMAL LOW (ref 150–400)
RBC: 3.6 MIL/uL — ABNORMAL LOW (ref 4.22–5.81)
RDW: 16.7 % — ABNORMAL HIGH (ref 11.5–15.5)
WBC: 5.7 10*3/uL (ref 4.0–10.5)
nRBC: 0.4 % — ABNORMAL HIGH (ref 0.0–0.2)

## 2020-06-20 LAB — FERRITIN: Ferritin: 48 ng/mL (ref 24–336)

## 2020-06-20 LAB — GLUCOSE, CAPILLARY
Glucose-Capillary: 172 mg/dL — ABNORMAL HIGH (ref 70–99)
Glucose-Capillary: 158 mg/dL — ABNORMAL HIGH (ref 70–99)
Glucose-Capillary: 186 mg/dL — ABNORMAL HIGH (ref 70–99)
Glucose-Capillary: 221 mg/dL — ABNORMAL HIGH (ref 70–99)

## 2020-06-20 LAB — PROTIME-INR
INR: 3.3 — ABNORMAL HIGH (ref 0.8–1.2)
Prothrombin Time: 33.2 seconds — ABNORMAL HIGH (ref 11.4–15.2)

## 2020-06-20 LAB — VITAMIN B12: Vitamin B-12: 472 pg/mL (ref 180–914)

## 2020-06-20 LAB — IRON AND TIBC
Iron: 36 ug/dL — ABNORMAL LOW (ref 45–182)
Saturation Ratios: 9 % — ABNORMAL LOW (ref 17.9–39.5)
TIBC: 395 ug/dL (ref 250–450)
UIBC: 359 ug/dL

## 2020-06-20 LAB — HIV ANTIBODY (ROUTINE TESTING W REFLEX): HIV Screen 4th Generation wRfx: NONREACTIVE

## 2020-06-20 LAB — FOLATE: Folate: 12.1 ng/mL (ref >5.9)

## 2020-06-20 MED ORDER — MORPHINE SULFATE (PF) 2 MG/ML IV SOLN
1.0000 mg | INTRAVENOUS | Status: DC | PRN
Start: 2020-06-20 — End: 2020-06-26
  Administered 2020-06-20 – 2020-06-24 (×19): 2 mg via INTRAVENOUS
  Filled 2020-06-20 (×19): qty 1

## 2020-06-20 NOTE — Progress Notes (Signed)
  Patient requests that echo be done tomorrow due to chest pain from compressions.  William Flores 06/20/2020, 1:41 PM

## 2020-06-20 NOTE — Plan of Care (Signed)

## 2020-06-20 NOTE — Progress Notes (Signed)
Patient stated that he will wear oxygen tonight again. He is still sore from syncopal episode and feels CPAP will hurt his chest more. RT will continue to monitor.

## 2020-06-20 NOTE — Consult Note (Signed)
Referring Provider: Dr. Karleen Hampshire  Primary Care Physician:  [name], [name], MD Primary Gastroenterologist:  Dr. Deatra Ina in 2014  Reason for Consultation: Anemia, positive FOBT  HPI: William Flores is a 70 y.o. male with a past medical history of hypertension, hyperlipidemia, coronary artery disease, cardiomyopathy, atrial fibrillation on Coumadin, diastolic CHF with LVEF 55 to 60%, CKD stage III, diabetes mellitus type 2, sleep apnea, " carbon monoxide poisoning ", IDA, colon polyps and colon cancer s/p right hemicolectomy in the early 20002.  He presented at his PCPs office yesterday for routine exam.  He apparently became unresponsive without a pulse and CPR was administered for 2 minutes, AED pads were placed but no shock was warranted and he was successfully resuscitated.  He was sent directly to [facility] for further evaluation and admission.  EKG in the ED was consistent with A. fib without evidence of acute ischemia or MI.  Troponin levels were negative.  This x-ray showed cardiomegaly with mild pulmonary edema and probable small left effusion.  He remains on Coumadin (last dose was at 8 PM on 6/16 ). INR 3.6.  Hemoglobin 9.0 (baseline Hg 12.2 03/2020). Platelet 86.  BUN slightly elevated at 39.  FOBT was light brown and nonmelenic but was occult positive.  A GI consult was requested for further evaluation regarding anemia with positive FOBT.  He denies having any heartburn or dysphagia.  He has intermittent burping for which she takes an OTC antiacid daily.  No nausea or vomiting.  He passes a normal dark brown to dark green stool once daily.  He is on ferrous sulfate 325 mg daily.  No melena or bright red blood per the rectum.  He denies having any abdominal pain but reports his abdomen has been swollen for 2 years and he has some discomfort to the area of his umbilical hernia.  He has a history of colon cancer status post right hemicolectomy in the early 20002.  He underwent a  colonoscopy by Dr. Deatra Ina in 2014 and a 6 mm tubular adenomatous polyp was removed from the descending colon.  His most recent colonoscopy was 12/2018 when admitted to [facility] 12/26/2018 due to generalized weakness, hypotension with profound anemia.  Admission hemoglobin at the time was 5.1.  He received 3 units of PRBCs.  He underwent an EGD and colonoscopy by Dr. Laural Golden on 12/28/2018.  The esophagus was normal, nodular mucosa with was noted in the gastric body and the duodenum was normal.  No evidence of H. pylori.  A colonoscopy was identified 11 polyps, 9 were tubular adenomatous and 2 polyps presumed to be hyperplastic were left in situ.  A repeat colonoscopy in 6 months was recommended but was not done.  No known family history of esophageal, gastric or colon cancer.  His mother had breast cancer.  EGD 12/28/2018 by Dr. Laural Golden at [facility]: - Normal esophagus. - Z-line regular, 45 cm from the incisors. - Bilious gastric fluid. Fluid aspiration performed. - Nodular mucosa in the gastric body. Biopsied. - Normal duodenal bulb and second portion of the duodenum.  Colonoscopy 12/28/2018: - Preparation of the colon was fair. - The examined portion of the ileum was normal. - One small polyp at the hepatic flexure. Biopsied. - One 4 mm polyp at the hepatic flexure, removed with a cold snare. Complete resection. Polyp tissue not retrieved. - Seven 7 to 16 mm polyps at the splenic flexure and in the transverse colon, removed with a  hot snare. Complete resection. Partial retrieval. Clips (MR conditional) were placed. - Two polyps not removed becuase of hyperperistalsis. -Repeat colonoscopy in 6 months was recommended Biopsy Results: A. STOMACH, BIOPSY:  - Gastric oxyntic mucosa with mild chronic gastritis.  Fundic gland  polyp(s).  - Warthin-Starry stain is negative for Helicobacter pylori.   B. COLON, HEPATIC FLEXURE AND TRANSVERSE, POLYPECTOMY:  - Tubular adenoma,  negative for high grade dysplasia (X multiple).  Colonoscopy 09/21/2012 Dr: Deatra Ina: 6 mm polyp was removed from the descending colon  Echo 04/01/2019: 1. Patient has evidence for significant right sided heart failure. 2. Left ventricular ejection fraction, by estimation, is 55 to 60%. The left ventricle has normal function. The left ventricle has no regional wall motion abnormalities. Left ventricular diastolic parameters were normal. 3. Right ventricular systolic function is severely reduced. The right ventricular size is severely enlarged. There is severely elevated pulmonary artery systolic pressure. 4. Left atrial size was moderately dilated. 5. The mitral valve is normal in structure. Trivial mitral valve regurgitation. No evidence of mitral stenosis. 6. The aortic valve is normal in structure. Aortic valve regurgitation is not visualized. Mild to moderate aortic valve sclerosis/calcification is present, without any evidence of aortic stenosis. 7. The inferior vena cava is dilated in size with <50% respiratory variability, suggesting right atrial pressure of 15 mmHg.  Past Medical History:  Diagnosis Date   CAD (coronary artery disease)    LHC 2/08:  mRCA 50%, o/w no sig CAD, EF 25%   Cardiomyopathy ([place])    EF 35-40% in 2/16 in setting of AF with RVR >> echo 5/16 with improved LVF with EF 65-70%   Chronic atrial fibrillation (HCC)    Chronic diastolic heart failure (HCC)    HFPF - EF ~65-70% (OFTEN EXACERBATED BY AFIB)   CKD (chronic kidney disease)    hx of worsening renal failure and hyperK+ in setting of acute diast CHF >> required CVVHD in 4/16 and dialysis x 1 in 5/16   CKD stage 3 due to type 2 diabetes mellitus ([place]) 09/17/2015   Colon cancer ([place])    Colon polyps 09/21/2012   Tubular adenoma   Diabetes ([place])    History of echocardiogram    Echo 05/16/14:  mild LVH, vigorous LVF, EF 65-70%, no RWMA, mean AV gradient 9 mmHg, MAC, mild MR, mod LAE, mild RVE, mild reduced  RVF, severe RAE   Hyperlipidemia    Hypertension    Obesity hypoventilation syndrome ([place])    Renal insufficiency    Sleep apnea     Past Surgical History:  Procedure Laterality Date   CIRCUMCISION     COLON RESECTION     COLONOSCOPY N/A 09/21/2012   Procedure: COLONOSCOPY;  Surgeon: Inda Castle, MD;  Location: [facility] ENDOSCOPY;  Service: Endoscopy;  Laterality: N/A;   COLONOSCOPY N/A 12/28/2018   Procedure: COLONOSCOPY;  Surgeon: Rogene Houston, MD;  Location: [facility] ENDO SU[facility];  Service: Endoscopy;  Laterality: N/A;   ESOPHAGOGASTRODUODENOSCOPY N/A 12/28/2018   Procedure: ESOPHAGOGASTRODUODENOSCOPY (EGD);  Surgeon: Rogene Houston, MD;  Location: [facility];  Service: Endoscopy;  Laterality: N/A;   RIGHT HEART CATH N/A 04/11/2019   Procedure: RIGHT HEART CATH;  Surgeon: Larey Dresser, MD;  Location: [facility];  Service: Cardiovascular;  Laterality: N/A;   US ECHOCARDIOGRAPHY  03/04/2010   abnormal study    Prior to Admission medications   Medication Sig Start Date End Date Taking? Authorizing Provider  acetaminophen (TYLENOL) 325 MG tablet Take  2 tablets (650 mg total) by mouth every 6 (six) hours as needed for mild pain (or Fever >/= 101). 01/01/19  Yes Emokpae, Courage, MD  allopurinol (ZYLOPRIM) 300 MG tablet Take 1 tablet (300 mg total) by mouth daily. 12/20/19  Yes [name], [name], MD  benzonatate (TESSALON) 100 MG capsule TAKE 1 CAPSULE BY MOUTH TWICE A DAY Patient taking differently: Take 100 mg by mouth 2 (two) times daily as needed for cough. 12/31/19  Yes [name], [name], MD  Dulaglutide (TRULICITY) 1.5 EG/3.1DV SOPN Inject 1.5 mg into the skin once a week. Patient taking differently: Inject 1.5 mg into the skin every Saturday. 12/20/19  Yes [name], [name], MD  ferrous sulfate 325 (65 FE) MG tablet Take 325 mg by mouth daily with breakfast.    Yes [provider]  insulin aspart (NOVOLOG FLEXPEN) 100 UNIT/ML FlexPen insulin aspart (novoLOG)  injection 0-9 Units 0-9 Units, Subcutaneous, 3 times daily with meals, CBG < 70: Implement Hypoglycemia Standing Orders and refer to Hypoglycemia Standing Orders sidebar report CBG 70 - 120: 0 units CBG 121 - 150: 1 unit CBG 151 - 200: 2 units CBG 201 - 250: 3 units CBG 251 - 300: 5 units CBG 301 - 350: 7 units CBG 351 - 400: 9 units CBG > 400 Patient taking differently: Inject 0-9 Units into the skin See admin instructions. Inject  0-9 Units subcutaneously  3 times daily with meals, per sliding scale: CBG < 70: Implement Hypoglycemia Standing Orders and refer to Hypoglycemia Standing Orders sidebar report CBG 70 - 120: 0 units CBG 121 - 150: 1 unit CBG 151 - 200: 2 units CBG 201 - 250: 3 units CBG 251 - 300: 5 units CBG 301 - 350: 7 units CBG 351 - 400: 9 units CBG > 400 12/20/19  Yes [name], [name], MD  insulin degludec (TRESIBA FLEXTOUCH) 100 UNIT/ML FlexTouch Pen Inject 24 Units into the skin at bedtime. 03/19/20  Yes [name], [name], MD  KLOR-CON M20 20 MEQ tablet TAKE 1/2 TABLET (10 MEQ TOTAL) BY MOUTH DAILY. NEED OFFICE VISIT Patient taking differently: Take 10 mEq by mouth every morning. 06/02/20  Yes [name], [name], MD  losartan (COZAAR) 25 MG tablet TAKE 1 TABLET BY MOUTH EVERY DAY Patient taking differently: Take 25 mg by mouth daily. 05/07/20  Yes Larey Dresser, MD  metoprolol succinate (TOPROL-XL) 50 MG 24 hr tablet TAKE 1 TABLET (50 MG TOTAL) BY MOUTH DAILY. TAKE WITH OR IMMEDIATELY FOLLOWING A MEAL. Patient taking differently: Take 50 mg by mouth daily. 05/25/20  Yes Larey Dresser, MD  pantoprazole (PROTONIX) 40 MG tablet TAKE 1 TABLET BY MOUTH EVERY DAY Patient taking differently: Take 40 mg by mouth daily. 06/02/20  Yes [name], [name], MD  polyethylene glycol (MIRALAX / GLYCOLAX) 17 g packet Take 17 g by mouth daily. Patient taking differently: Take 17 g by mouth daily as needed (constipation). 01/01/19  Yes Roxan Hockey, MD  torsemide (DEMADEX) 20 MG tablet Take  3 tablets (60 mg total) by mouth in the morning AND 2 tablets (40 mg total) every evening. 05/25/20  Yes Larey Dresser, MD  warfarin (COUMADIN) 5 MG tablet Take 1 tablet (5 mg total) by mouth every evening. Patient taking differently: Take 5-7.5 mg by mouth See admin instructions. Take 1 1/2 tablets (7.5 mg) by mouth on Monday evening, take 1 tablet (5 mg) on all other nights of the week 12/20/19  Yes [name], [name], MD  atorvastatin (  LIPITOR) 20 MG tablet TAKE 1 TABLET BY MOUTH EVERY DAY Patient not taking: No sig reported 03/19/20   [name], [name], MD  FARXIGA 5 MG TABS tablet Take 5 mg by mouth daily. 01/26/19   [provider]  glucose blood (CONTOUR NEXT TEST) test strip Test BS TID Dx E11.29 05/22/20   [name], [name], MD  Insulin Pen Needle 32G X 4 MM MISC Use to give insulin daily Dx E11.29 04/15/19   Charlynne Cousins, MD  sildenafil (REVATIO) 20 MG tablet Take 1 tablet (20 mg total) by mouth 3 (three) times daily. 06/13/19   Larey Dresser, MD  diltiazem [facility]) 240 MG 24 hr capsule TAKE 1 CAPSULE DAILY 10/19/12 02/17/13  Vernie Shanks, MD    Current Facility-Administered Medications  Medication Dose Route Frequency Provider Last Rate Last Admin   0.9 %  sodium chloride infusion  250 mL Intravenous PRN Nicolette Bang, MD       atorvastatin (LIPITOR) tablet 20 mg  20 mg Oral Daily Nicolette Bang, MD   20 mg at 06/20/20 0844   ferrous sulfate tablet 325 mg  325 mg Oral Q breakfast Nicolette Bang, MD   325 mg at 06/20/20 2703   insulin aspart (novoLOG) injection 0-15 Units  0-15 Units Subcutaneous TID WC Nicolette Bang, MD   3 Units at 06/20/20 0843   insulin aspart (novoLOG) injection 0-5 Units  0-5 Units Subcutaneous QHS Nicolette Bang, MD       insulin glargine (LANTUS) injection 20 Units  20 Units Subcutaneous Daily Nicolette Bang, MD   20 Units at 06/20/20 0844   losartan (COZAAR) tablet 25 mg   25 mg Oral Daily Nicolette Bang, MD   25 mg at 06/20/20 0844   metoprolol succinate (TOPROL-XL) 24 hr tablet 50 mg  50 mg Oral Daily Nicolette Bang, MD   50 mg at 06/20/20 0844   pantoprazole (PROTONIX) EC tablet 40 mg  40 mg Oral Daily Nicolette Bang, MD   40 mg at 06/20/20 0844   polyethylene glycol (MIRALAX / GLYCOLAX) packet 17 g  17 g Oral Daily Nicolette Bang, MD   17 g at 06/20/20 0844   sodium chloride flush (NS) 0.9 % injection 3 mL  3 mL Intravenous Q12H Nicolette Bang, MD   3 mL at 06/20/20 0845   sodium chloride flush (NS) 0.9 % injection 3 mL  3 mL Intravenous PRN Nicolette Bang, MD        Allergies as of 06/19/2020 - Review Complete 06/19/2020  Allergen Reaction Noted   Codeine  03/29/2012    Family History  Problem Relation Age of Onset   Breast cancer Mother    Diabetes Mother    Heart attack Father     Social History   Socioeconomic History   Marital status: Divorced    Spouse name: Not on file   Number of children: 1   Years of education: 12   Highest education level: 12th grade  Occupational History    Employer: [employer]  Tobacco Use   Smoking status: Former    Pack years: 0.00    Types: Cigarettes    Quit date: 08/06/1983    Years since quitting: 36.8   Smokeless tobacco: Never  Substance and Sexual Activity   Alcohol use: No   Drug use: No   Sexual activity: Not Currently  Other Topics Concern   Not on file  Social History Narrative   Lives alone   Social Determinants of Health   Financial Resource Strain: Low Risk    Difficulty of Paying Living Expenses: Not very hard  Food Insecurity: Not on file  Transportation Needs: Not on file  Physical Activity: Inactive   Days of Exercise per Week: 0 days   Minutes of Exercise per Session: 0 min  Stress: Not on file  Social Connections: Socially Isolated   Frequency of Communication with Friends and Family: More than three  times a week   Frequency of Social Gatherings with Friends and Family: More than three times a week   Attends Religious Services: Never   Marine scientist or Organizations: No   Attends Music therapist: Never   Marital Status: Divorced  Human resources officer Violence: Not on file    Review of Systems: See HPI, all other systems reviewed and are negative  Physical Exam: Vital signs in last 24 hours: Temp:  [98.4 F (36.9 C)-99 F (37.2 C)] 99 F (37.2 C) (06/18 0700) Pulse Rate:  [43-100] 76 (06/18 0700) Resp:  [15-32] 18 (06/18 0700) BP: (112-143)/(74-91) 143/89 (06/18 0700) SpO2:  [91 %-98 %] 91 % (06/18 0700) Weight:  [120.2 kg] 120.2 kg (06/17 1517) Last BM Date: 06/18/20 General:  Alert obese 70 year old male in no acute distress. Head:  Normocephalic and atraumatic. Eyes:  No scleral icterus. Conjunctiva pink. Ears:  Normal auditory acuity. Nose:  No deformity, discharge or lesions. Mouth:  Dentition intact. No ulcers or lesions.  Neck:  Supple. No lymphadenopathy or thyromegaly.  Lungs: Breath sounds clear but decreased in the bases.  Oxygen 2 L nasal cannula in use. Heart: Regular rhythm, no murmurs. Abdomen: Obese abdomen, moderate upper abdominal tenderness at the costal margins (developed following CPR).  Billable hernia.  Difficult to assess for hepatosplenomegaly secondary to body habitus.  Query ascites. Rectal: Deferred. Musculoskeletal:  Symmetrical without gross deformities.  Pulses:  Normal pulses noted. Extremities: Lower extremities with venous stasis, brawny discoloration. Neurologic:  Alert and  oriented x4. No focal deficits.  Skin:  Intact without significant lesions or rashes. Psych:  Alert and cooperative. Normal mood and affect.  Intake/Output from previous day: 06/17 0701 - 06/18 0700 In: 100 [IV Piggyback:100] Out: 600 [Urine:600] Intake/Output this shift: No intake/output data recorded.  Lab Results: Recent Labs     06/19/20 1550 06/20/20 0248  WBC 5.3 5.7  HGB 9.0* 10.3*  HCT 30.6* 34.8*  PLT 86* 121*   BMET Recent Labs    06/19/20 1550 06/20/20 0248  NA 141 140  K 3.1* 4.3  CL 113* 108  CO2 20* 22  GLUCOSE 153* 166*  BUN 39* 49*  CREATININE 1.77* 2.23*  CALCIUM 7.0* 9.2   LFT Recent Labs    06/19/20 1550  PROT 4.7*  ALBUMIN 2.4*  AST 13*  ALT 11  ALKPHOS 49  BILITOT 0.7   PT/INR Recent Labs    06/19/20 1558 06/20/20 0248  LABPROT 36.2* 33.2*  INR 3.6* 3.3*   Hepatitis Panel No results for input(s): HEPBSAG, HCVAB, HEPAIGM, HEPBIGM in the last 72 hours.    Studies/Results: DG Chest Port 1 View  Result Date: 06/19/2020 CLINICAL DATA:  Unresponsive chest pain EXAM: PORTABLE CHEST 1 VIEW COMPARISON:  04/10/2019 FINDINGS: Cardiomegaly with vascular congestion and mild interstitial edema. Probable small left effusion. Aortic atherosclerosis. No pneumothorax. IMPRESSION: Cardiomegaly with vascular congestion, mild interstitial pulmonary edema and probable small left effusion Electronically Signed   By: Maudie Mercury  Francoise Ceo M.D.   On: 06/19/2020 16:25    IMPRESSION/PLAN:  45.  70 year old male with history CAD, colon polys and colon cancer s/p right hemicolectomy in 2002 admitted to the hospital with syncope versus cardiac arrest with anemia. + FOBT.  Hemoglobin 9.0  (baseline Hg 12.2)-> 10.3. iron 36.  TIBC 395.  Ferritin 48.  B12 472.  No overt GI bleeding. -CBC and iron panel in am -Monitor the patient closely for active GI bleeding -Pantoprazole 40 mg po daily -EGD and colonoscopy when INR corrected -Await further recommendations from Dr. Tarri Glenn  2.  Atrial fibrillation.  INR supratherapeutic 3.6.  Last dose of Coumadin was on 6/16 at 8 PM. Coumadin on hold. -INR in am  3.  Acute on chronic CHF  4.  CKD stage III   Patrecia Pour [facility]  06/20/2020, 10:53 AM

## 2020-06-20 NOTE — Care Management Obs Status (Signed)
[facility] NOTIFICATION   Patient Details  Name: William Flores MRN: [id number] Date of Birth: [birth year]   Medicare Observation Status Notification Given:  Yes    Norina Buzzard, RN 06/20/2020, 3:10 PM

## 2020-06-20 NOTE — Consult Note (Addendum)
Cardiology Consultation:   Patient ID: William Flores MRN: [id number]; DOB: 06-26-1950  Admit date: 06/19/2020 Date of Consult: 06/20/2020  PCP:  [name], [name], MD   [facility] Providers Cardiologist:  Minus Breeding, MD   Patient Profile:   William Flores is a 70 y.o. male with a hx of HTN, HLD, DM, chronic Afib on coumadin, chronic systolic and diastolic heart failure felt to be tachy-mediated, RV failure with pulmonary hypertension, obesity, OSA with poor compliance on CPAP, hx of GIB bleed, hx of colon cancer with resection, and hx of MGUS who is being seen 06/20/2020 for the evaluation of syncope vs cardiac arrest at the request of Dr. Karleen Hampshire.  History of Present Illness:   William Flores has no know coronary disease by heart cath in 2008 (per records). He has had several admissions with AKI and CHF. Admitted 02/2014 with Afib RVR, AKI, and CHF with EF 35-40%, thought to be tachycardia-mediated.  He was admitted again in 04/2014 with hypotension, AKI, and multiple metabolic derangements that required CVVH.  Echo at that time with EF 60 to 65%.  He was again admitted 05/2014 with hypoxic respiratory failure and was intubated.  EF again was 60 to 65%.  He required HD x1.  Renal function did recover.  In 2020 he suffered a GI bleed in the setting of supratherapeutic INR that was greater than 10, hemoglobin 5.1.  No source of bleeding was found.  He was admitted to [facility] 03/2019 and subsequently transferred to [facility] for further management of AKI and RV failure.  LVEF 55 to 60%, RV severely enlarged with severely reduced RV systolic function.  Pulmonary artery systolic pressure elevated at 76 mmHg.  RHC showed elevated filling pressures and mixed pulmonary venous pulmonary hypertension.  VQ scan was negative for chronic PE.  He was started on 20 mg sildenafil 3 times daily, and advised compliance with supplemental O2 at home and CPAP use.  He was last seen by AHF clinic 07/29/19.   He was on 2 L nasal cannula continuously with mild shortness of breath with exertion.  He was using CPAP every night at that time.   Yesterday, while checking in for a regularly scheduled PCP visit, the patient lost consciousness in the lobby.  He was assisted to the ground.  CPR was administered and AED pads applied.  No shock was warranted.  Patient regained consciousness and ROSC obtained within less than a minute.  Patient placed on 2 L nasal cannula and transported to [facility].  HS troponin 10 --> 19 CXR with vascular congestion and mild interstitial pulmonary edema.  Orthostatics negative.  BNP  Hb 9.0 UA and UDS pending  Cardiology consulted for syncope vs cardiac arrest. Telemetry reviewed and shows Afib, PVCs and bigeminy. No pauses or blocks. He states he felt a little dizzy and sat in a chair. He does not remember anything after that. No recent, chest pain, dyspnea or palpitations.  He has been compliant on medications, supplemental oxygen, and CPAP at night.  His main complaint right now is chest soreness from compressions.  He is unsure if he has had blood in his stool.  No recent medication changes.   Past Medical History:  Diagnosis Date   CAD (coronary artery disease)    LHC 2/08:  mRCA 50%, o/w no sig CAD, EF 25%   Cardiomyopathy ([place])    EF 35-40% in 2/16 in setting of AF with RVR >> echo 5/16 with  improved LVF with EF 65-70%   Chronic atrial fibrillation (HCC)    Chronic diastolic heart failure (HCC)    HFPF - EF ~65-70% (OFTEN EXACERBATED BY AFIB)   CKD (chronic kidney disease)    hx of worsening renal failure and hyperK+ in setting of acute diast CHF >> required CVVHD in 4/16 and dialysis x 1 in 5/16   CKD stage 3 due to type 2 diabetes mellitus ([place]) 09/17/2015   Colon cancer ([place])    Colon polyps 09/21/2012   Tubular adenoma   Diabetes ([place])    History of echocardiogram    Echo 05/16/14:  mild LVH, vigorous LVF, EF 65-70%, no RWMA, mean AV gradient 9 mmHg,  MAC, mild MR, mod LAE, mild RVE, mild reduced RVF, severe RAE   Hyperlipidemia    Hypertension    Obesity hypoventilation syndrome ([place])    Renal insufficiency    Sleep apnea     Past Surgical History:  Procedure Laterality Date   CIRCUMCISION     COLON RESECTION     COLONOSCOPY N/A 09/21/2012   Procedure: COLONOSCOPY;  Surgeon: Inda Castle, MD;  Location: [facility] END[facility];  Service: Endoscopy;  Laterality: N/A;   COLONOSCOPY N/A 12/28/2018   Procedure: COLONOSCOPY;  Surgeon: Rogene Houston, MD;  Location: [facility] SU[facility];  Service: Endoscopy;  Laterality: N/A;   ESOPHAGOGASTRODUODENOSCOPY N/A 12/28/2018   Procedure: ESOPHAGOGASTRODUODENOSCOPY (EGD);  Surgeon: Rogene Houston, MD;  Location: [facility];  Service: Endoscopy;  Laterality: N/A;   RIGHT HEART CATH N/A 04/11/2019   Procedure: RIGHT HEART CATH;  Surgeon: Larey Dresser, MD;  Location: [facility];  Service: Cardiovascular;  Laterality: N/A;   US ECHOCARDIOGRAPHY  03/04/2010   abnormal study     Home Medications:  Prior to Admission medications   Medication Sig Start Date End Date Taking? Authorizing Provider  acetaminophen (TYLENOL) 325 MG tablet Take 2 tablets (650 mg total) by mouth every 6 (six) hours as needed for mild pain (or Fever >/= 101). 01/01/19  Yes Emokpae, Courage, MD  allopurinol (ZYLOPRIM) 300 MG tablet Take 1 tablet (300 mg total) by mouth daily. 12/20/19  Yes [name], [name], MD  benzonatate (TESSALON) 100 MG capsule TAKE 1 CAPSULE BY MOUTH TWICE A DAY Patient taking differently: Take 100 mg by mouth 2 (two) times daily as needed for cough. 12/31/19  Yes [name], [name], MD  Dulaglutide (TRULICITY) 1.5 OZ/3.0QM SOPN Inject 1.5 mg into the skin once a week. Patient taking differently: Inject 1.5 mg into the skin every Saturday. 12/20/19  Yes [name], [name], MD  ferrous sulfate 325 (65 FE) MG tablet Take 325 mg by mouth daily with breakfast.    Yes [provider]  insulin  aspart (NOVOLOG FLEXPEN) 100 UNIT/ML FlexPen insulin aspart (novoLOG) injection 0-9 Units 0-9 Units, Subcutaneous, 3 times daily with meals, CBG < 70: Implement Hypoglycemia Standing Orders and refer to Hypoglycemia Standing Orders sidebar report CBG 70 - 120: 0 units CBG 121 - 150: 1 unit CBG 151 - 200: 2 units CBG 201 - 250: 3 units CBG 251 - 300: 5 units CBG 301 - 350: 7 units CBG 351 - 400: 9 units CBG > 400 Patient taking differently: Inject 0-9 Units into the skin See admin instructions. Inject  0-9 Units subcutaneously  3 times daily with meals, per sliding scale: CBG < 70: Implement Hypoglycemia Standing Orders and refer to Hypoglycemia Standing Orders sidebar report CBG 70 - 120: 0 units CBG 121 -  150: 1 unit CBG 151 - 200: 2 units CBG 201 - 250: 3 units CBG 251 - 300: 5 units CBG 301 - 350: 7 units CBG 351 - 400: 9 units CBG > 400 12/20/19  Yes [name], [name], MD  insulin degludec (TRESIBA FLEXTOUCH) 100 UNIT/ML FlexTouch Pen Inject 24 Units into the skin at bedtime. 03/19/20  Yes [name], [name], MD  KLOR-CON M20 20 MEQ tablet TAKE 1/2 TABLET (10 MEQ TOTAL) BY MOUTH DAILY. NEED OFFICE VISIT Patient taking differently: Take 10 mEq by mouth every morning. 06/02/20  Yes [name], [name], MD  losartan (COZAAR) 25 MG tablet TAKE 1 TABLET BY MOUTH EVERY DAY Patient taking differently: Take 25 mg by mouth daily. 05/07/20  Yes Larey Dresser, MD  metoprolol succinate (TOPROL-XL) 50 MG 24 hr tablet TAKE 1 TABLET (50 MG TOTAL) BY MOUTH DAILY. TAKE WITH OR IMMEDIATELY FOLLOWING A MEAL. Patient taking differently: Take 50 mg by mouth daily. 05/25/20  Yes Larey Dresser, MD  pantoprazole (PROTONIX) 40 MG tablet TAKE 1 TABLET BY MOUTH EVERY DAY Patient taking differently: Take 40 mg by mouth daily. 06/02/20  Yes [name], [name], MD  polyethylene glycol (MIRALAX / GLYCOLAX) 17 g packet Take 17 g by mouth daily. Patient taking differently: Take 17 g by mouth daily as needed (constipation).  01/01/19  Yes Roxan Hockey, MD  torsemide (DEMADEX) 20 MG tablet Take 3 tablets (60 mg total) by mouth in the morning AND 2 tablets (40 mg total) every evening. 05/25/20  Yes Larey Dresser, MD  warfarin (COUMADIN) 5 MG tablet Take 1 tablet (5 mg total) by mouth every evening. Patient taking differently: Take 5-7.5 mg by mouth See admin instructions. Take 1 1/2 tablets (7.5 mg) by mouth on Monday evening, take 1 tablet (5 mg) on all other nights of the week 12/20/19  Yes [name], [name], MD  atorvastatin (LIPITOR) 20 MG tablet TAKE 1 TABLET BY MOUTH EVERY DAY Patient not taking: No sig reported 03/19/20   [name], [name], MD  FARXIGA 5 MG TABS tablet Take 5 mg by mouth daily. 01/26/19   [provider]  glucose blood (CONTOUR NEXT TEST) test strip Test BS TID Dx E11.29 05/22/20   [name], [name], MD  Insulin Pen Needle 32G X 4 MM MISC Use to give insulin daily Dx E11.29 04/15/19   Charlynne Cousins, MD  sildenafil (REVATIO) 20 MG tablet Take 1 tablet (20 mg total) by mouth 3 (three) times daily. 06/13/19   Larey Dresser, MD  diltiazem [facility]) 240 MG 24 hr capsule TAKE 1 CAPSULE DAILY 10/19/12 02/17/13  Vernie Shanks, MD    Inpatient Medications: Scheduled Meds:  atorvastatin  20 mg Oral Daily   ferrous sulfate  325 mg Oral Q breakfast   insulin aspart  0-15 Units Subcutaneous TID WC   insulin aspart  0-5 Units Subcutaneous QHS   insulin glargine  20 Units Subcutaneous Daily   losartan  25 mg Oral Daily   metoprolol succinate  50 mg Oral Daily   pantoprazole  40 mg Oral Daily   polyethylene glycol  17 g Oral Daily   sodium chloride flush  3 mL Intravenous Q12H   Continuous Infusions:  sodium chloride     PRN Meds: sodium chloride, morphine injection, sodium chloride flush  Allergies:    Allergies  Allergen Reactions   Codeine     Headache     Social History:   Social History  Socioeconomic History   Marital status: Divorced    Spouse name: Not  on file   Number of children: 1   Years of education: 52   Highest education level: 12th grade  Occupational History    Employer: [employer]  Tobacco Use   Smoking status: Former    Pack years: 0.00    Types: Cigarettes    Quit date: 08/06/1983    Years since quitting: 36.8   Smokeless tobacco: Never  Substance and Sexual Activity   Alcohol use: No   Drug use: No   Sexual activity: Not Currently  Other Topics Concern   Not on file  Social History Narrative   Lives alone   Social Determinants of Health   Financial Resource Strain: Low Risk    Difficulty of Paying Living Expenses: Not very hard  Food Insecurity: Not on file  Transportation Needs: Not on file  Physical Activity: Inactive   Days of Exercise per Week: 0 days   Minutes of Exercise per Session: 0 min  Stress: Not on file  Social Connections: Socially Isolated   Frequency of Communication with Friends and Family: More than three times a week   Frequency of Social Gatherings with Friends and Family: More than three times a week   Attends Religious Services: Never   Marine scientist or Organizations: No   Attends Music therapist: Never   Marital Status: Divorced  Human resources officer Violence: Not on file    Family History:    Family History  Problem Relation Age of Onset   Breast cancer Mother    Diabetes Mother    Heart attack Father      ROS:  Please see the history of present illness.   All other ROS reviewed and negative.     Physical Exam/Data:   Vitals:   06/19/20 2130 06/20/20 0436 06/20/20 0700 06/20/20 1100  BP:  137/88 (!) 143/89 (!) 121/91  Pulse: 82 90 76   Resp: (!) _0 Temp:  98.4 F (36.9 C) 99 F (37.2 C) 98.6 F (37 C)  TempSrc:  Oral Oral Oral  SpO2: 95% 98% 91% 92%  Weight:      Height:        Intake/Output Summary (Last 24 hours) at 06/20/2020 1422 Last data filed at 06/20/2020 0436 Gross per 24 hour  Intake 100 ml  Output 600 ml  Net  -500 ml   Last 3 Weights 06/19/2020 03/19/2020 12/20/2019  Weight (lbs) 265 lb 261 lb 266 lb  Weight (kg) 120.203 kg 118.389 kg 120.657 kg     Body mass index is 46.94 kg/m.  General:  obese male in NAD Neck: no JVD Vascular: No carotid bruits  Cardiac: Irregular rhythm, regular rate Lungs: Sore on anterior chest and bilateral ribs, respirations unlabored Abd: soft, nontender, no hepatomegaly  Ext: Mild bilateral lower extremity edema with chronic skin changes, pulses weak Musculoskeletal:  No deformities, BUE and BLE strength normal and equal Skin: warm and dry  Neuro:  CNs 2-12 intact, no focal abnormalities noted Psych:  Normal affect   EKG:  The EKG was personally reviewed and demonstrates:  atrial fibrillation with ventricular rate 90, PVC, right axis Telemetry:  Telemetry was personally reviewed and demonstrates:  Afib 80-90s, PVCs, bigeminy   Relevant CV Studies:  Echo pending  Echo 04/01/19: 1. Patient has evidence for significant right sided heart failure. 2. Left ventricular ejection fraction, by estimation, is 55 to 60%. The  left ventricle has normal function. The left ventricle has no regional wall motion abnormalities. Left ventricular diastolic parameters were normal. 3. Right ventricular systolic function is severely reduced. The right ventricular size is severely enlarged. There is severely elevated pulmonary artery systolic pressure. 4. Left atrial size was moderately dilated. 5. The mitral valve is normal in structure. Trivial mitral valve regurgitation. No evidence of mitral stenosis. 6. The aortic valve is normal in structure. Aortic valve regurgitation is not visualized. Mild to moderate aortic valve sclerosis/calcification is present, without any evidence of aortic stenosis. 7. The inferior vena cava is dilated in size with <50% respiratory variability, suggesting right atrial pressure of 15 mmHg.  Laboratory Data:  High Sensitivity Troponin:   Recent  Labs  Lab 06/19/20 1550 06/19/20 1756  TROPONINIHS 10 19*     Chemistry Recent Labs  Lab 06/19/20 1550 06/20/20 0248  NA 141 140  K 3.1* 4.3  CL 113* 108  CO2 20* 22  GLUCOSE 153* 166*  BUN 39* 49*  CREATININE 1.77* 2.23*  CALCIUM 7.0* 9.2  GFRNONAA 41* 31*  ANIONGAP 8 10    Recent Labs  Lab 06/19/20 1550  PROT 4.7*  ALBUMIN 2.4*  AST 13*  ALT 11  ALKPHOS 49  BILITOT 0.7   Hematology Recent Labs  Lab 06/19/20 1550 06/20/20 0248  WBC 5.3 5.7  RBC 3.15* 3.60*  HGB 9.0* 10.3*  HCT 30.6* 34.8*  MCV 97.1 96.7  MCH 28.6 28.6  MCHC 29.4* 29.6*  RDW 16.7* 16.7*  PLT 86* 121*   BNP Recent Labs  Lab 06/19/20 1630  BNP 308.6*    DDimer No results for input(s): DDIMER in the last 168 hours.   Radiology/Studies:  DG Chest Port 1 View  Result Date: 06/19/2020 CLINICAL DATA:  Unresponsive chest pain EXAM: PORTABLE CHEST 1 VIEW COMPARISON:  04/10/2019 FINDINGS: Cardiomegaly with vascular congestion and mild interstitial edema. Probable small left effusion. Aortic atherosclerosis. No pneumothorax. IMPRESSION: Cardiomegaly with vascular congestion, mild interstitial pulmonary edema and probable small left effusion Electronically Signed   By: Donavan Foil M.D.   On: 06/19/2020 16:25     Assessment and Plan:   Syncope vs cardiac arrest - pt denied chest pain - CE have been negative - EKG does not appear ischemic - unchanged from prior - given anemia and heme positive stool, suspect this may have been related to anemia/hypotension - will repeat an echo and continue to monitor on telemetry - pending echo, may consider nuclear stress test tomorrow - consider placing zio patch prior to discharge - will need to consider driving restrictions   Chest pain - from compressions   RV failure Pulmonary hypertension - BNP 309, CXR with some congestion - he states he is near baseline in terms of breathing and swelling - continue losartan and toprol - restart  sildenafil - received one dose of 40 mg lasix IV --> bump in creatinine - hold torsemide today, but will likely need to restart tomorrow   Chronic Afib PVCs, periods of bigeminy - stable rates on telemetry   Chronic Coumadin therapy - INR 3.6 --> 3.3 - per pharmacy   Heme occult positive - History of GI bleed - Hb 9.0 --> 10.3 - per primary   Acute on chronic renal insufficiency - he has a history of AKI requiring HD/CVVH - sCr 1.77 --> 2.23 - baseline appears to be 1.4-1.6 - albumin 2.4 - received 1 dose of 40 mg lasix IV - daily BMP - holding torsemide  for now   Risk Assessment/Risk Scores:  { CHA2DS2-VASc Score = 4  This indicates a 4.8% annual risk of stroke. The patient's score is based upon: CHF History: Yes HTN History: Yes Diabetes History: Yes Stroke History: No Vascular Disease History: No Age Score: 1 Gender Score: 0      For questions or updates, please contact [place] Please consult [email or web address] for contact info under    Signed, William Bottcher, PA  06/20/2020 2:22 PM   History and all data above reviewed.  Patient examined.  I agree with the findings as above.  Patient was doing well prior to this event.  He is on chronic oxygen but he does his own household chores and lives independently.  He has not been having any chest pain.  He has been having any new shortness of breath, PND or orthopnea.  He has not been having any palpitations, presyncope or syncope.  He was not having any chest pressure, neck or arm discomfort.  He was standing up they after leaving the doctor's office when he felt lightheaded and the next thing he knows he is regaining consciousness on his way into the ambulance.  He now has chest soreness related to likely CPR.  It is difficult for him to move.  Echocardiogram is pending.  He is anemic.  He does have renal insufficiency .  He is not having any acute EKG changes and no significant troponin elevation.  The patient  exam reveals COR: Irregular, no murmurs or rubs,  Lungs: Clear to auscultation bilaterally,  Abd: Positive bowel sounds, no rebound, guarding, Ext 2+ pulses, no edema.  All available labs, radiology testing, previous records reviewed. Agree with documented assessment and plan.  Syncope this could have been a hypotensive or vagal episode.  I do not strongly suspect a malignant dysrhythmia as there were no shocks needed in the event lasted for less than a minute.  At this point we are awaiting the echocardiogram.  I agree that likely needs a noninvasive ischemia evaluation pending these results.  I would have a very high threshold for catheterization given his renal insufficiency.  Ultimately he will probably need a Zio patch at discharge.   At this point it is difficult to assess orthostatics because he is so uncomfortable following CPR he does not want to move.    William Flores  4:49 PM  06/20/2020

## 2020-06-20 NOTE — Progress Notes (Signed)
PROGRESS NOTE    William Flores  PPI:[id number] DOB: 1950-11-02 DOA: 06/19/2020 PCP: [name], [name], MD   Chief Complaint  Patient presents with   Loss of Consciousness    Brief Narrative:  William Flores is a 70 y.o. male with PMH of HTN, T2DM, CKD III, Afib on Coumadin, HLD, HFpEF who presents to ED via EMS for syncopal episode versus cardiac arrest. Pt reports feeling lightheaded, and the next thing he knew he was in the ambulance . As per the notes, code blue was called and CPR started, and ROSC obtained in less than a minute. He was admitted to the hospital for possible cardiac arrest vs syncope and blood loss anemia .   Assessment & Plan:   Active Problems:   Hypertensive heart disease   History of colon cancer   Morbid obesity ([place])   Right heart failure (HCC)   OSA (obstructive sleep apnea)   Nonischemic cardiomyopathy (HCC)   Anticoagulated on Coumadin   Atrial fibrillation with slow ventricular response (HCC)   DM (diabetes mellitus), type 2, uncontrolled, with renal complications (HCC)   Normocytic anemia   CKD stage 3 due to type 2 diabetes mellitus ([name])   Hyperlipidemia associated with type 2 diabetes mellitus ([name])   Pulmonary hypertension ([place])   Syncope   Hypokalemia   Fecal occult blood test positive   Syncope/ cardiac arrest  As per the notes ROSC IN less than a min.  Cardiology consulted, ,monitor on telemetry.  Echocardiogram ordered.  EKG does not show any ischemic changes.  Elevated troponins due to demand ischemia from syncope    Chest pain from chest compressions.  - pain control.  Non radiating, tender to touch.    H/io atrial fibrillation  Rate controlled. Supratherapeutic INR VAS2C score is 4.  On coumadin at home, which is being held for acute anemia of blood loss.    Acute anemia of blood loss possibly sec to GI bleed.  - guiac positive stools. - GI consulted, plan for EGD and colonoscopy  - IV ppi.  - transfuse to keep  hemoglobin greater than 7.  - anemia panel. Shows low iron levels.  - baseline hemoglobin greater than 12 dropped to 9 this admission.    Type 2 Diabetes Mellitus:  CBG (last 3)  Recent Labs    06/19/20 2226 06/20/20 0831 06/20/20 1130  GLUCAP 192* 172* 158*   Resume SSI.  Insulin dependent, and continue with SSI.  Repeat A1c is pending.     Stage 3 a CKD:  Baseline creatinine is around 1.4, on admission its around 1.7 worsened to 2.2 today.  Hold nephrotoxins.  BUN jumped up from 39 to 49.    Hypokalemia  Replaced.    Body mass index is 46.94 kg/m. Morbid obesity.  Recommend outpatient follow up with PCP.   Hypertension:  BP parameters are optimal.     Hyperlipidemia:  Resume home meds.     Mild thrombocytopenia:  No obvious bleeding.     DVT prophylaxis: scd's Code Status: (FULL CODE.  Family Communication: none at bedside.  Disposition:   Status is: Observation  The patient will require care spanning > 2 midnights and should be moved to inpatient because: Ongoing diagnostic testing needed not appropriate for outpatient work up and Unsafe d/c plan  Dispo: The patient is from: Home              Anticipated d/c is to: Home  Patient currently is not medically stable to d/c.   Difficult to place patient No       Consultants:  GI CARDIOLOGY.   Procedures: Echocardiogram.   Antimicrobials: none.    Subjective: No chest pain or sob.   Objective: Vitals:   06/19/20 2130 06/20/20 0436 06/20/20 0700 06/20/20 1100  BP:  137/88 (!) 143/89 (!) 121/91  Pulse: 82 90 76   Resp: (!) 25 15 18 16   Temp:  98.4 F (36.9 C) 99 F (37.2 C) 98.6 F (37 C)  TempSrc:  Oral Oral Oral  SpO2: 95% 98% 91% 92%  Weight:      Height:        Intake/Output Summary (Last 24 hours) at 06/20/2020 1543 Last data filed at 06/20/2020 0436 Gross per 24 hour  Intake 100 ml  Output 600 ml  Net -500 ml   Filed Weights   06/19/20 1517  Weight:  120.2 kg    Examination:  General exam: Appears calm and comfortable  Respiratory system: Clear to auscultation. Respiratory effort normal. Cardiovascular system: S1 & S2 heard, No JVD, irregularly irregular.  No pedal edema. Gastrointestinal system: Abdomen is nondistended, soft and nontender.  Normal bowel sounds heard. Central nervous system: Alert and oriented. No focal neurological deficits. Extremities: Symmetric 5 x 5 power. Skin: No rashes, lesions or ulcers Psychiatry: Mood & affect appropriate.     Data Reviewed: I have personally reviewed following labs and imaging studies  CBC: Recent Labs  Lab 06/19/20 1550 06/20/20 0248  WBC 5.3 5.7  NEUTROABS 4.4  --   HGB 9.0* 10.3*  HCT 30.6* 34.8*  MCV 97.1 96.7  PLT 86* 121*    Basic Metabolic Panel: Recent Labs  Lab 06/19/20 1550 06/19/20 2316 06/20/20 0248  NA 141  --  140  K 3.1*  --  4.3  CL 113*  --  108  CO2 20*  --  22  GLUCOSE 153*  --  166*  BUN 39*  --  49*  CREATININE 1.77*  --  2.23*  CALCIUM 7.0*  --  9.2  MG  --  2.1  --     GFR: Estimated Creatinine Clearance: 36.9 mL/min (A) (by C-G formula based on SCr of 2.23 mg/dL (H)).  Liver Function Tests: Recent Labs  Lab 06/19/20 1550  AST 13*  ALT 11  ALKPHOS 49  BILITOT 0.7  PROT 4.7*  ALBUMIN 2.4*    CBG: Recent Labs  Lab 06/19/20 1515 06/19/20 2226 06/20/20 0831 06/20/20 1130  GLUCAP 170* 192* 172* 158*     Recent Results (from the past 240 hour(s))  SARS CORONAVIRUS 2 (TAT 6-24 HRS) Nasopharyngeal Urine, Clean Catch     Status: None   Collection Time: 06/19/20  6:38 PM   Specimen: Urine, Clean Catch; Nasopharyngeal  Result Value Ref Range Status   SARS Coronavirus 2 NEGATIVE NEGATIVE Final    Comment: (NOTE) SARS-CoV-2 target nucleic acids are NOT DETECTED.  The SARS-CoV-2 RNA is generally detectable in upper and lower respiratory specimens during the acute phase of infection. Negative results do not preclude  SARS-CoV-2 infection, do not rule out co-infections with other pathogens, and should not be used as the sole basis for treatment or other patient management decisions. Negative results must be combined with clinical observations, patient history, and epidemiological information. The expected result is Negative.  Fact Sheet for Patients: SugarRoll.be  Fact Sheet for Healthcare Providers: https://www.woods-mathews.com/  This test is not yet approved or cleared by the  [place] Architectural technologist and  has been authorized for detection and/or diagnosis of SARS-CoV-2 by FDA under an Print production planner (EUA). This EUA will remain  in effect (meaning this test can be used) for the duration of the COVID-19 declaration under Se ction 564(b)(1) of the Act, 21 U.S.C. section 360bbb-3(b)(1), unless the authorization is terminated or revoked sooner.  Performed at [facility], [place] [address] [place], [place] [address]          Radiology Studies: DG Chest Port 1 View  Result Date: 06/19/2020 CLINICAL DATA:  Unresponsive chest pain EXAM: PORTABLE CHEST 1 VIEW COMPARISON:  04/10/2019 FINDINGS: Cardiomegaly with vascular congestion and mild interstitial edema. Probable small left effusion. Aortic atherosclerosis. No pneumothorax. IMPRESSION: Cardiomegaly with vascular congestion, mild interstitial pulmonary edema and probable small left effusion Electronically Signed   By: Donavan Foil M.D.   On: 06/19/2020 16:25        Scheduled Meds:  atorvastatin  20 mg Oral Daily   ferrous sulfate  325 mg Oral Q breakfast   insulin aspart  0-15 Units Subcutaneous TID WC   insulin aspart  0-5 Units Subcutaneous QHS   insulin glargine  20 Units Subcutaneous Daily   losartan  25 mg Oral Daily   metoprolol succinate  50 mg Oral Daily   pantoprazole  40 mg Oral Daily   polyethylene glycol  17 g Oral Daily   sodium chloride flush  3 mL Intravenous Q12H    Continuous Infusions:  sodium chloride       LOS: 0 days        Hosie Poisson, MD [facility]   To contact the attending provider between 7A-7P or the covering provider during after hours 7P-7A, please log into the web site [email or web address] and access using universal [place] password for that web site. If you do not have the password, please call the hospital operator.  06/20/2020, 3:43 PM

## 2020-06-20 NOTE — Progress Notes (Signed)
RT spoke to patient about wearing CPAP.  Patient stated his chest is sore currently and will just wear the oxygen.  RT advised will check back with him next night to see if he is feeling better.

## 2020-06-21 ENCOUNTER — Inpatient Hospital Stay (HOSPITAL_COMMUNITY): Payer: Medicare Other

## 2020-06-21 DIAGNOSIS — D649 Anemia, unspecified: Secondary | ICD-10-CM | POA: Diagnosis not present

## 2020-06-21 DIAGNOSIS — R55 Syncope and collapse: Secondary | ICD-10-CM

## 2020-06-21 DIAGNOSIS — I340 Nonrheumatic mitral (valve) insufficiency: Secondary | ICD-10-CM

## 2020-06-21 DIAGNOSIS — Z7901 Long term (current) use of anticoagulants: Secondary | ICD-10-CM | POA: Diagnosis not present

## 2020-06-21 LAB — COMPREHENSIVE METABOLIC PANEL
ALT: 10 U/L (ref 0–44)
AST: 16 U/L (ref 15–41)
Albumin: 3.2 g/dL — ABNORMAL LOW (ref 3.5–5.0)
Alkaline Phosphatase: 63 U/L (ref 38–126)
Anion gap: 8 (ref 5–15)
BUN: 54 mg/dL — ABNORMAL HIGH (ref 8–23)
CO2: 25 mmol/L (ref 22–32)
Calcium: 9.1 mg/dL (ref 8.9–10.3)
Chloride: 106 mmol/L (ref 98–111)
Creatinine, Ser: 2.41 mg/dL — ABNORMAL HIGH (ref 0.61–1.24)
GFR, Estimated: 29 mL/min — ABNORMAL LOW (ref >60)
Glucose, Bld: 153 mg/dL — ABNORMAL HIGH (ref 70–99)
Potassium: 4.8 mmol/L (ref 3.5–5.1)
Sodium: 139 mmol/L (ref 135–145)
Total Bilirubin: 0.9 mg/dL (ref 0.3–1.2)
Total Protein: 6.6 g/dL (ref 6.5–8.1)

## 2020-06-21 LAB — GLUCOSE, CAPILLARY
Glucose-Capillary: 144 mg/dL — ABNORMAL HIGH (ref 70–99)
Glucose-Capillary: 158 mg/dL — ABNORMAL HIGH (ref 70–99)
Glucose-Capillary: 149 mg/dL — ABNORMAL HIGH (ref 70–99)
Glucose-Capillary: 176 mg/dL — ABNORMAL HIGH (ref 70–99)

## 2020-06-21 LAB — URINALYSIS, ROUTINE W REFLEX MICROSCOPIC
Bilirubin Urine: NEGATIVE
Glucose, UA: NEGATIVE mg/dL
Ketones, ur: NEGATIVE mg/dL
Leukocytes,Ua: NEGATIVE
Nitrite: NEGATIVE
Protein, ur: 100 mg/dL — AB
Specific Gravity, Urine: 1.02 (ref 1.005–1.030)
pH: 5.5 (ref 5.0–8.0)

## 2020-06-21 LAB — URINALYSIS, MICROSCOPIC (REFLEX)
Bacteria, UA: NONE SEEN
WBC, UA: NONE SEEN WBC/hpf (ref 0–5)

## 2020-06-21 LAB — ECHOCARDIOGRAM COMPLETE
Height: 63 in
S' Lateral: 3.7 cm
Weight: 4352.76 oz

## 2020-06-21 LAB — CBC WITH DIFFERENTIAL/PLATELET
Abs Immature Granulocytes: 0.03 10*3/uL (ref 0.00–0.07)
Basophils Absolute: 0.1 10*3/uL (ref 0.0–0.1)
Basophils Relative: 1 %
Eosinophils Absolute: 0.1 10*3/uL (ref 0.0–0.5)
Eosinophils Relative: 2 %
HCT: 36.6 % — ABNORMAL LOW (ref 39.0–52.0)
Hemoglobin: 11.1 g/dL — ABNORMAL LOW (ref 13.0–17.0)
Immature Granulocytes: 1 %
Lymphocytes Relative: 11 %
Lymphs Abs: 0.7 10*3/uL (ref 0.7–4.0)
MCH: 29.1 pg (ref 26.0–34.0)
MCHC: 30.3 g/dL (ref 30.0–36.0)
MCV: 96.1 fL (ref 80.0–100.0)
Monocytes Absolute: 0.8 10*3/uL (ref 0.1–1.0)
Monocytes Relative: 11 %
Neutro Abs: 4.9 10*3/uL (ref 1.7–7.7)
Neutrophils Relative %: 74 %
Platelets: 105 10*3/uL — ABNORMAL LOW (ref 150–400)
RBC: 3.81 MIL/uL — ABNORMAL LOW (ref 4.22–5.81)
RDW: 16.9 % — ABNORMAL HIGH (ref 11.5–15.5)
WBC: 6.6 10*3/uL (ref 4.0–10.5)
nRBC: 0 % (ref 0.0–0.2)

## 2020-06-21 LAB — SODIUM, URINE, RANDOM: Sodium, Ur: 24 mmol/L

## 2020-06-21 LAB — PROTIME-INR
INR: 2.7 — ABNORMAL HIGH (ref 0.8–1.2)
Prothrombin Time: 28.4 seconds — ABNORMAL HIGH (ref 11.4–15.2)

## 2020-06-21 LAB — CREATININE, URINE, RANDOM: Creatinine, Urine: 110.51 mg/dL

## 2020-06-21 LAB — CALCIUM, IONIZED: Calcium, Ionized, Serum: 4.8 mg/dL (ref 4.5–5.6)

## 2020-06-21 MED ORDER — PERFLUTREN LIPID MICROSPHERE
1.0000 mL | INTRAVENOUS | Status: AC | PRN
  Administered 2020-06-21: 3 mL via INTRAVENOUS
  Filled 2020-06-21: qty 10

## 2020-06-21 MED ORDER — BENZONATATE 100 MG PO CAPS
100.0000 mg | ORAL_CAPSULE | Freq: Three times a day (TID) | ORAL | Status: DC | PRN
  Administered 2020-06-21 – 2020-06-24 (×7): 100 mg via ORAL
  Filled 2020-06-21 (×7): qty 1

## 2020-06-21 NOTE — Progress Notes (Signed)
Progress Note  Patient Name: William Flores Date of Encounter: 06/21/2020  Primary Cardiologist:   Minus Breeding, MD   Subjective   He still has chest soreness but this is improving.  No SOB.   Inpatient Medications    Scheduled Meds:  atorvastatin  20 mg Oral Daily   ferrous sulfate  325 mg Oral Q breakfast   insulin aspart  0-15 Units Subcutaneous TID WC   insulin aspart  0-5 Units Subcutaneous QHS   insulin glargine  20 Units Subcutaneous Daily   losartan  25 mg Oral Daily   metoprolol succinate  50 mg Oral Daily   pantoprazole  40 mg Oral Daily   polyethylene glycol  17 g Oral Daily   sodium chloride flush  3 mL Intravenous Q12H   Continuous Infusions:  sodium chloride     PRN Meds: sodium chloride, morphine injection, sodium chloride flush   Vital Signs    Vitals:   06/20/20 1100 06/20/20 2031 06/21/20 0006 06/21/20 0458  BP: (!) 121/91 (!) 157/78 (!) 142/86 115/80  Pulse:  (!) 102 91 98  Resp: 16     Temp: 98.6 F (37 C) 98.7 F (37.1 C)  98 F (36.7 C)  TempSrc: Oral Oral  Oral  SpO2: 92% 93% 94% 96%  Weight:    123.4 kg  Height:       No intake or output data in the 24 hours ending 06/21/20 0819 Filed Weights   06/19/20 1517 06/21/20 0458  Weight: 120.2 kg 123.4 kg    Telemetry    Atrial fib with PVCs and occasional couplets and triplets - Personally Reviewed  ECG    NA - Personally Reviewed  Physical Exam   GEN: No acute distress.   Neck: No  JVD Cardiac:   Irregular RR,  murmurs, rubs, or gallops.  Respiratory: Clear  to auscultation bilaterally. GI: Soft, nontender, non-distended  MS: No  edema; No deformity. Neuro:  Nonfocal  Psych: Normal affect   Labs    Chemistry Recent Labs  Lab 06/19/20 1550 06/20/20 0248 06/21/20 0222  NA 141 140 139  K 3.1* 4.3 4.8  CL 113* 108 106  CO2 20* 22 25  GLUCOSE 153* 166* 153*  BUN 39* 49* 54*  CREATININE 1.77* 2.23* 2.41*  CALCIUM 7.0* 9.2 9.1  PROT 4.7*  --  6.6  ALBUMIN 2.4*   --  3.2*  AST 13*  --  16  ALT 11  --  10  ALKPHOS 49  --  63  BILITOT 0.7  --  0.9  GFRNONAA 41* 31* 29*  ANIONGAP 8 10 8      Hematology Recent Labs  Lab 06/19/20 1550 06/20/20 0248 06/21/20 0222  WBC 5.3 5.7 6.6  RBC 3.15* 3.60* 3.81*  HGB 9.0* 10.3* 11.1*  HCT 30.6* 34.8* 36.6*  MCV 97.1 96.7 96.1  MCH 28.6 28.6 29.1  MCHC 29.4* 29.6* 30.3  RDW 16.7* 16.7* 16.9*  PLT 86* 121* 105*    Cardiac EnzymesNo results for input(s): TROPONINI in the last 168 hours. No results for input(s): TROPIPOC in the last 168 hours.   BNP Recent Labs  Lab 06/19/20 1630  BNP 308.6*     DDimer No results for input(s): DDIMER in the last 168 hours.   Radiology    DG Chest Port 1 View  Result Date: 06/19/2020 CLINICAL DATA:  Unresponsive chest pain EXAM: PORTABLE CHEST 1 VIEW COMPARISON:  04/10/2019 FINDINGS: Cardiomegaly with vascular congestion and mild interstitial  edema. Probable small left effusion. Aortic atherosclerosis. No pneumothorax. IMPRESSION: Cardiomegaly with vascular congestion, mild interstitial pulmonary edema and probable small left effusion Electronically Signed   By: Donavan Foil M.D.   On: 06/19/2020 16:25    Cardiac Studies   ECHO:  No reading yet.   Poor images.  Await final reading.   Patient Profile     70 y.o. male with a hx of HTN, HLD, DM, chronic Afib on coumadin, chronic systolic and diastolic heart failure felt to be tachy-mediated, RV failure with pulmonary hypertension, obesity, OSA with poor compliance on CPAP, hx of GIB bleed, hx of colon cancer with resection, and hx of MGUS who is being seen 06/20/2020 for the evaluation of syncope vs cardiac arrest at the request of Dr. Karleen Hampshire.  Assessment & Plan    SYNCOPE:  I don't strongly suspect ischemia or primary arrhythmia.  He is having too much rib pain from the CPR to consent to his stress test.  They were able to gingerly complete an echo this AM.  I think we can hold off on this and ischemic study for a  few weeks as an out patient since the pre test probability is low.  I would suggest an event patch for 4 weeks at discharge.    RV FAILURE:   Echo pending. Pulmonary HTN.  Echo results pending as above.   CHRONIC ATRIAL FIB:  On warfarin per pharmacy.  Heme positive stool but CBC is stable.  Continue current therapy.    AKI:  Creat is rising.  Hold torsemide again today.     For questions or updates, please contact [place] Please consult [email or web address] for contact info under Cardiology/STEMI.   Signed, Minus Breeding, MD  06/21/2020, 8:19 AM

## 2020-06-21 NOTE — Progress Notes (Signed)
  Echocardiogram 2D Echocardiogram has been performed.  William Flores 06/21/2020, 9:02 AM

## 2020-06-21 NOTE — Progress Notes (Signed)
RT note. Patient refuse CPAP for the night, R Twill continue to monitor

## 2020-06-21 NOTE — Plan of Care (Signed)
  Problem: Education: Goal: Knowledge of General Education information will improve Description: Including pain rating scale, medication(s)/side effects and non-pharmacologic comfort measures 06/21/2020 0741 by Camillia Herter, RN Outcome: Progressing 06/21/2020 0724 by Camillia Herter, RN Outcome: Progressing   Problem: Health Behavior/Discharge Planning: Goal: Ability to manage health-related needs will improve 06/21/2020 0741 by Camillia Herter, RN Outcome: Progressing 06/21/2020 0724 by Camillia Herter, RN Outcome: Progressing   Problem: Clinical Measurements: Goal: Ability to maintain clinical measurements within normal limits will improve 06/21/2020 0741 by Camillia Herter, RN Outcome: Progressing 06/21/2020 0724 by Camillia Herter, RN Outcome: Progressing Goal: Will remain free from infection 06/21/2020 0741 by Camillia Herter, RN Outcome: Progressing 06/21/2020 0724 by Camillia Herter, RN Outcome: Progressing Goal: Diagnostic test results will improve 06/21/2020 0741 by Camillia Herter, RN Outcome: Progressing 06/21/2020 0724 by Camillia Herter, RN Outcome: Progressing Goal: Respiratory complications will improve 06/21/2020 0741 by Camillia Herter, RN Outcome: Progressing 06/21/2020 0724 by Camillia Herter, RN Outcome: Progressing Goal: Cardiovascular complication will be avoided 06/21/2020 0741 by Camillia Herter, RN Outcome: Progressing 06/21/2020 0724 by Camillia Herter, RN Outcome: Progressing   Problem: Activity: Goal: Risk for activity intolerance will decrease 06/21/2020 0741 by Camillia Herter, RN Outcome: Progressing 06/21/2020 0724 by Camillia Herter, RN Outcome: Progressing   Problem: Nutrition: Goal: Adequate nutrition will be maintained 06/21/2020 0741 by Camillia Herter, RN Outcome: Progressing 06/21/2020 0724 by Camillia Herter, RN Outcome: Progressing   Problem: Coping: Goal: Level of anxiety will decrease 06/21/2020 0741 by Camillia Herter, RN Outcome:  Progressing 06/21/2020 0724 by Camillia Herter, RN Outcome: Progressing   Problem: Elimination: Goal: Will not experience complications related to bowel motility 06/21/2020 0741 by Camillia Herter, RN Outcome: Progressing 06/21/2020 0724 by Camillia Herter, RN Outcome: Progressing Goal: Will not experience complications related to urinary retention 06/21/2020 0741 by Camillia Herter, RN Outcome: Progressing 06/21/2020 0724 by Camillia Herter, RN Outcome: Progressing   Problem: Pain Managment: Goal: General experience of comfort will improve 06/21/2020 0741 by Camillia Herter, RN Outcome: Progressing 06/21/2020 0724 by Camillia Herter, RN Outcome: Progressing   Problem: Safety: Goal: Ability to remain free from injury will improve 06/21/2020 0741 by Camillia Herter, RN Outcome: Progressing 06/21/2020 0724 by Camillia Herter, RN Outcome: Progressing   Problem: Skin Integrity: Goal: Risk for impaired skin integrity will decrease 06/21/2020 0741 by Camillia Herter, RN Outcome: Progressing 06/21/2020 0724 by Camillia Herter, RN Outcome: Progressing

## 2020-06-21 NOTE — Plan of Care (Signed)

## 2020-06-21 NOTE — Progress Notes (Signed)
Gastroenterology Inpatient Follow-up Note   PATIENT IDENTIFICATION  William Flores is a 70 y.o. male Hospital Day: 3  SUBJECTIVE  The patient feels okay today with some persistent chest discomfort. Hemoglobin is stable. INR downtrending further but still higher than would likely be ready for any type of endoscopic procedures. The patient denies fevers or chills.  No progressive abdominal pain.   OBJECTIVE  Scheduled Inpatient Medications:   atorvastatin  20 mg Oral Daily   ferrous sulfate  325 mg Oral Q breakfast   insulin aspart  0-15 Units Subcutaneous TID WC   insulin aspart  0-5 Units Subcutaneous QHS   insulin glargine  20 Units Subcutaneous Daily   metoprolol succinate  50 mg Oral Daily   pantoprazole  40 mg Oral Daily   polyethylene glycol  17 g Oral Daily   sodium chloride flush  3 mL Intravenous Q12H   Continuous Inpatient Infusions:   sodium chloride     PRN Inpatient Medications: sodium chloride, benzonatate, morphine injection, sodium chloride flush   Physical Examination  Temp:  [97.6 F (36.4 C)-98.3 F (36.8 C)] 97.6 F (36.4 C) (06/19 2026) Pulse Rate:  [90-101] 95 (06/19 2026) Resp:  [18-20] 20 (06/19 2026) BP: (114-142)/(68-97) 127/87 (06/19 2026) SpO2:  [93 %-96 %] 93 % (06/19 2026) Weight:  [123.4 kg] 123.4 kg (06/19 0458) Temp (24hrs), Avg:97.9 F (36.6 C), Min:97.6 F (36.4 C), Max:98.3 F (36.8 C)  Weight: 123.4 kg GEN: Chronically ill-appearing gentleman, nontoxic PSYCH: Cooperative, without pressured speech EYE: Conjunctivae pink, sclerae anicteric ENT: MMM NECK: Enlarged neck girth CV: Irregularly irregular RESP: Decreased breath sounds at bases bilaterally GI: NABS, soft, protuberant abdomen, hernia present, no rebound  MSK/EXT: Lower extremity edema present SKIN: No jaundice, NEURO:  Alert & Oriented x 3, no focal deficits   Review of Data   Laboratory Studies   Recent Labs  Lab 06/19/20 2316 06/20/20 0248 06/21/20 0222   NA  --    < > 139  K  --    < > 4.8  CL  --    < > 106  CO2  --    < > 25  BUN  --    < > 54*  CREATININE  --    < > 2.41*  GLUCOSE  --    < > 153*  CALCIUM  --    < > 9.1  MG 2.1  --   --    < > = values in this interval not displayed.   Recent Labs  Lab 06/21/20 0222  AST 16  ALT 10  ALKPHOS 63    Recent Labs  Lab 06/19/20 1550 06/20/20 0248 06/21/20 0222  WBC 5.3 5.7 6.6  HGB 9.0* 10.3* 11.1*  HCT 30.6* 34.8* 36.6*  PLT 86* 121* 105*   Recent Labs  Lab 06/19/20 1558 06/20/20 0248 06/21/20 0924  INR 3.6* 3.3* 2.7*    Imaging Studies  US RENAL  Result Date: 06/21/2020 CLINICAL DATA:  Acute kidney injury. Patient with history of chronic kidney disease. EXAM: RENAL / URINARY TRACT ULTRASOUND COMPLETE COMPARISON:  Noncontrast abdominal CT 12/26/2018 FINDINGS: Right Kidney: Renal measurements: 13.6 x 7.1 x 7.3 cm = volume: 360 mL. Lobulated renal contours with mild parenchymal thinning. No hydronephrosis. No evidence of stone or focal renal lesion. Left Kidney: Renal measurements: 12.8 x 8.0 x 7.5 cm = volume: 402 mL. Mild parenchymal thinning. No hydronephrosis. Technically limited evaluation due to habitus and patient's difficulty with breath hold.  No obvious focal lesion. Bladder: Appears normal for degree of bladder distention. Other: None. IMPRESSION: 1. No hydronephrosis. 2. Bilateral renal parenchymal thinning. Technically limited evaluation, particularly of the left kidney, due to habitus. Electronically Signed   By: Keith Rake M.D.   On: 06/21/2020 15:03   ECHOCARDIOGRAM COMPLETE  Result Date: 06/21/2020    ECHOCARDIOGRAM REPORT   Patient Name:   William Flores Date of Exam: 06/21/2020 Medical Rec #:  [id number]      Height:       63.0 in Accession #:    [id number]     Weight:       272.0 lb Date of Birth:  24-Jul-1950      BSA:          2.204 m Patient Age:    28 years       BP:           115/80 mmHg Patient Gender: M              HR:           100 bpm. Exam  Location:  Inpatient Procedure: 2D Echo, Cardiac Doppler, Color Doppler and Intracardiac            Opacification Agent Indications:    R55 Syncope  History:        Patient has prior history of Echocardiogram examinations, most                 recent 04/01/2019. Cardiomyopathy and CHF, CAD, Arrythmias:Atrial                 Fibrillation; Risk Factors:Diabetes, Dyslipidemia and                 Hypertension.  Sonographer:    Bernadene Person RDCS Referring Phys: [id number] Nicolette Bang  Sonographer Comments: Patient is morbidly obese. Image acquisition challenging due to patient body habitus and Image acquisition challenging due to respiratory motion. IMPRESSIONS  1. Technically difficult study despite Definity contrast  2. Left ventricular ejection fraction, by estimation, is 50 to 55%. The left ventricle has low normal function. Left ventricular endocardial border not optimally defined to evaluate regional wall motion. There is mild left ventricular hypertrophy. Left ventricular diastolic function could not be evaluated. There is the interventricular septum is flattened in systole and diastole, consistent with right ventricular pressure and volume overload.  3. Right ventricular systolic function is severely reduced. The right ventricular size is severely enlarged. There is severely elevated pulmonary artery systolic pressure. The estimated right ventricular systolic pressure is 46.2 mmHg.  4. The mitral valve is abnormal. Mild mitral valve regurgitation.  5. The aortic valve is tricuspid. Aortic valve regurgitation is not visualized. Mild aortic valve sclerosis is present, with no evidence of aortic valve stenosis.  6. The inferior vena cava is dilated in size with <50% respiratory variability, suggesting right atrial pressure of 15 mmHg. Comparison(s): Changes from prior study are noted. 04/01/2019: LVEF 55-60%, severe RV dysfunction and dilitation - RVSP 76 mmHg. FINDINGS  Left Ventricle: Left ventricular  ejection fraction, by estimation, is 50 to 55%. The left ventricle has low normal function. Left ventricular endocardial border not optimally defined to evaluate regional wall motion. Definity contrast agent was given IV  to delineate the left ventricular endocardial borders. The left ventricular internal cavity size was normal in size. There is mild left ventricular hypertrophy. The interventricular septum is flattened in systole and diastole, consistent with right ventricular pressure and volume  overload. Left ventricular diastolic function could not be evaluated due to atrial fibrillation. Left ventricular diastolic function could not be evaluated. Right Ventricle: The right ventricular size is severely enlarged. Right vetricular wall thickness was not well visualized. Right ventricular systolic function is severely reduced. There is severely elevated pulmonary artery systolic pressure. The tricuspid regurgitant velocity is 4.34 m/s, and with an assumed right atrial pressure of 15 mmHg, the estimated right ventricular systolic pressure is 61.9 mmHg. Left Atrium: Left atrial size was normal in size. Right Atrium: Right atrial size was normal in size. Pericardium: There is no evidence of pericardial effusion. Mitral Valve: The mitral valve is abnormal. There is mild thickening of the mitral valve leaflet(s). There is moderate calcification of the posterior mitral valve leaflet(s). Mild mitral annular calcification. Mild mitral valve regurgitation. Tricuspid Valve: The tricuspid valve is not well visualized. Tricuspid valve regurgitation is trivial. Aortic Valve: The aortic valve is tricuspid. Aortic valve regurgitation is not visualized. Mild aortic valve sclerosis is present, with no evidence of aortic valve stenosis. Pulmonic Valve: The pulmonic valve was not well visualized. Pulmonic valve regurgitation is not visualized. Aorta: The aortic root and ascending aorta are structurally normal, with no evidence of  dilitation. Venous: The inferior vena cava is dilated in size with less than 50% respiratory variability, suggesting right atrial pressure of 15 mmHg. IAS/Shunts: No atrial level shunt detected by color flow Doppler.  LEFT VENTRICLE PLAX 2D LVIDd:         4.90 cm LVIDs:         3.70 cm LV PW:         1.00 cm LV IVS:        1.20 cm LVOT diam:     2.10 cm LV SV:         64 LV SV Index:   29 LVOT Area:     3.46 cm  RIGHT VENTRICLE TAPSE (M-mode): 0.9 cm LEFT ATRIUM           Index       RIGHT ATRIUM           Index LA diam:      4.90 cm 2.22 cm/m  RA Area:     18.90 cm LA Vol (A2C): 61.7 ml 28.00 ml/m RA Volume:   47.50 ml  21.55 ml/m LA Vol (A4C): 63.8 ml 28.95 ml/m  AORTIC VALVE LVOT Vmax:   118.00 cm/s LVOT Vmean:  75.433 cm/s LVOT VTI:    0.184 m  AORTA Ao Root diam: 3.20 cm Ao Asc diam:  3.30 cm TRICUSPID VALVE TR Peak grad:   75.3 mmHg TR Vmax:        434.00 cm/s  SHUNTS Systemic VTI:  0.18 m Systemic Diam: 2.10 cm Lyman Bishop MD Electronically signed by Lyman Bishop MD Signature Date/Time: 06/21/2020/12:27:18 PM    Final     GI Procedures and Studies  No new relevant studies to review   ASSESSMENT  Mr. Goldston is a 70 y.o. male with a pmh significant for CHF, atrial fibrillation (on Coumadin), chronic renal insufficiency, diabetes, CAD, prior colon cancer (status post resection in 2000s), colon polyps, sleep apnea who presented after a syncope requiring CPR without shock and found to have anemia and heme positive stool.    Hemodynamically stable today.  INR slowly downtrending.  I do not think he will be ready for his EGD/colonoscopy tomorrow.  If his INR is further downtrending on Monday, then EGD/colonoscopy on Tuesday will be considered.  If the EGD/colonoscopy is nondiagnostic then a video capsule endoscopy is recommended.  We will see how things look tomorrow.  Dr. Carlean Purl takes over for the [facility] inpatient GI service tomorrow.   PLAN/RECOMMENDATIONS  Trend INR If downtrending and  looking favorable, EGD/colonoscopy on Tuesday Preparation orders will be placed once we know that the patient is a candidate for interventions   Please page/call with questions or concerns.   Justice Britain, MD [facility] Office # [phone]    LOS: 1 day  Irving Copas  06/21/2020, 11:53 PM

## 2020-06-21 NOTE — Progress Notes (Signed)
PROGRESS NOTE    William Flores  XHB:[id number] DOB: [birth year] DOA: 06/19/2020 PCP: [name], [name], MD   Chief Complaint  Patient presents with   Loss of Consciousness    Brief Narrative:  William Flores is a 70 y.o. male with PMH of HTN, T2DM, CKD III, Afib on Coumadin, HLD, HFpEF who presents to ED via EMS for syncopal episode versus cardiac arrest. Pt reports feeling lightheaded, and the next thing he knew he was in the ambulance . As per the notes, code blue was called and CPR started, and ROSC obtained in less than a minute. He was admitted to the hospital for possible cardiac arrest vs syncope and blood loss anemia .   Assessment & Plan:   Active Problems:   Hypertensive heart disease   History of colon cancer   Morbid obesity ([place])   Right heart failure (HCC)   OSA (obstructive sleep apnea)   Nonischemic cardiomyopathy (HCC)   Anticoagulated on Coumadin   Atrial fibrillation with slow ventricular response (HCC)   DM (diabetes mellitus), type 2, uncontrolled, with renal complications (HCC)   Normocytic anemia   CKD stage 3 due to type 2 diabetes mellitus ([place])   Hyperlipidemia associated with type 2 diabetes mellitus ([place])   Pulmonary hypertension ([place])   Syncope   Hypokalemia   Fecal occult blood test positive   Syncope/ cardiac arrest  As per the notes ROSC obtained in less than a min.  Cardiology consulted, was initially scheduled for myoview, but cancelled due to severe chest tenderness. He will be scheduled for ischemic work up as outpatient. Echocardiogram ordered and pending.  EKG does not show any ischemic changes.  Elevated troponins due to demand ischemia from syncope  Will probably need ZIO patch prior to discharge.    Chest pain from chest compressions.  - pain control.  Non radiating, tender to touch.    Paroxysmal atrial fibrillation.  Rate controlled. Supratherapeutic INR CHA2 DS2 VASc score is 4.  On coumadin at home, which is being held  for acute anemia of blood loss.    Acute anemia of blood loss possibly sec to GI bleed.  - guiac positive stools. - GI consulted, plan for EGD and colonoscopy when INR is less than 2.  - INR today is 2.7, continue to monitor.  - continue with IV PPI.  - transfuse to keep hemoglobin greater than 7.  - anemia panel. Shows low iron levels.  - baseline hemoglobin greater than 12 dropped to 10 this admission.  - hemoglobin stable this am.    Type 2 Diabetes Mellitus:  CBG (last 3)  Recent Labs    06/20/20 1636 06/20/20 2033 06/21/20 0746  GLUCAP 186* 221* 144*    Better controlled CBG'S  Insulin dependent, and continue with SSI.  Repeat A1c is pending.      Acute on Stage 3 a CKD:  Baseline creatinine is around 1.4, on admission its around 1.7 worsened to 2.2  to 2.4 today.  Get US renal and urine sodium and UA. D/c losartan.  Hold nephrotoxins.  BUN jumped up from 39 to 49.    Hypokalemia  Replaced.    Body mass index is 48.19 kg/m. Morbid obesity.  Recommend outpatient follow up with PCP.   Hypertension:  BP parameters are slightly elevated probably from chest pain from compressions.  Will add hydralazine prn.     Hyperlipidemia:  Resume home meds.     Mild thrombocytopenia:  No obvious bleeding.  DVT prophylaxis: scd's Code Status: FULL CODE.  Family Communication: none at bedside.  Disposition:   Status is: Observation  The patient will require care spanning > 2 midnights and should be moved to inpatient because: Ongoing diagnostic testing needed not appropriate for outpatient work up and Unsafe d/c plan  Dispo: The patient is from: Home              Anticipated d/c is to: Home              Patient currently is not medically stable to d/c.   Difficult to place patient No       Consultants:  GI CARDIOLOGY.   Procedures: Echocardiogram.   Antimicrobials: none.    Subjective: Chest tenderness present, slight improvement with  pain meds.   Objective: Vitals:   06/20/20 2031 06/21/20 0006 06/21/20 0458 06/21/20 0745  BP: (!) 157/78 (!) 142/86 115/80 (!) 142/97  Pulse: (!) 102 91 98 (!) 101  Resp:    18  Temp: 98.7 F (37.1 C)  98 F (36.7 C) 97.6 F (36.4 C)  TempSrc: Oral  Oral Oral  SpO2: 93% 94% 96% 95%  Weight:   123.4 kg   Height:        Intake/Output Summary (Last 24 hours) at 06/21/2020 1050 Last data filed at 06/21/2020 0900 Gross per 24 hour  Intake --  Output 450 ml  Net -450 ml    Filed Weights   06/19/20 1517 06/21/20 0458  Weight: 120.2 kg 123.4 kg    Examination:  General exam: alert and comfortable on 3 lit of [place] oxygen.  Respiratory system: air entry fair bilateral, no wheezing heard,  Cardiovascular system: S1 & S2 heard, irregularly irregular, tachycardic, no JVD, no pedal edema.  Gastrointestinal system: abdomen is soft, non tender non distended bowel sounds wnl.  Central nervous system: Alert and oriented, non focal. Extremities: no cyanosis.  Skin: No rashes seen.  Psychiatry: Mood is appropriate.     Data Reviewed: I have personally reviewed following labs and imaging studies  CBC: Recent Labs  Lab 06/19/20 1550 06/20/20 0248 06/21/20 0222  WBC 5.3 5.7 6.6  NEUTROABS 4.4  --  4.9  HGB 9.0* 10.3* 11.1*  HCT 30.6* 34.8* 36.6*  MCV 97.1 96.7 96.1  PLT 86* 121* 105*     Basic Metabolic Panel: Recent Labs  Lab 06/19/20 1550 06/19/20 2316 06/20/20 0248 06/21/20 0222  NA 141  --  140 139  K 3.1*  --  4.3 4.8  CL 113*  --  108 106  CO2 20*  --  22 25  GLUCOSE 153*  --  166* 153*  BUN 39*  --  49* 54*  CREATININE 1.77*  --  2.23* 2.41*  CALCIUM 7.0*  --  9.2 9.1  MG  --  2.1  --   --      GFR: Estimated Creatinine Clearance: 34.6 mL/min (A) (by C-G formula based on SCr of 2.41 mg/dL (H)).  Liver Function Tests: Recent Labs  Lab 06/19/20 1550 06/21/20 0222  AST 13* 16  ALT 11 10  ALKPHOS 49 63  BILITOT 0.7 0.9  PROT 4.7* 6.6  ALBUMIN 2.4*  3.2*     CBG: Recent Labs  Lab 06/20/20 0831 06/20/20 1130 06/20/20 1636 06/20/20 2033 06/21/20 0746  GLUCAP 172* 158* 186* 221* 144*      Recent Results (from the past 240 hour(s))  SARS CORONAVIRUS 2 (TAT 6-24 HRS) Nasopharyngeal Urine, Clean Catch  Status: None   Collection Time: 06/19/20  6:38 PM   Specimen: Urine, Clean Catch; Nasopharyngeal  Result Value Ref Range Status   SARS Coronavirus 2 NEGATIVE NEGATIVE Final    Comment: (NOTE) SARS-CoV-2 target nucleic acids are NOT DETECTED.  The SARS-CoV-2 RNA is generally detectable in upper and lower respiratory specimens during the acute phase of infection. Negative results do not preclude SARS-CoV-2 infection, do not rule out co-infections with other pathogens, and should not be used as the sole basis for treatment or other patient management decisions. Negative results must be combined with clinical observations, patient history, and epidemiological information. The expected result is Negative.  Fact Sheet for Patients: SugarRoll.be  Fact Sheet for Healthcare Providers: https://www.woods-mathews.com/  This test is not yet approved or cleared by the [place] FDA and  has been authorized for detection and/or diagnosis of SARS-CoV-2 by FDA under an Emergency Use Authorization (EUA). This EUA will remain  in effect (meaning this test can be used) for the duration of the COVID-19 declaration under Se ction 564(b)(1) of the Act, 21 U.S.C. section 360bbb-3(b)(1), unless the authorization is terminated or revoked sooner.  Performed at [facility], [place] [address] [place], [place] [address]           Radiology Studies: DG Chest Port 1 View  Result Date: 06/19/2020 CLINICAL DATA:  Unresponsive chest pain EXAM: PORTABLE CHEST 1 VIEW COMPARISON:  04/10/2019 FINDINGS: Cardiomegaly with vascular congestion and mild interstitial edema. Probable small left  effusion. Aortic atherosclerosis. No pneumothorax. IMPRESSION: Cardiomegaly with vascular congestion, mild interstitial pulmonary edema and probable small left effusion Electronically Signed   By: Donavan Foil M.D.   On: 06/19/2020 16:25        Scheduled Meds:  atorvastatin  20 mg Oral Daily   ferrous sulfate  325 mg Oral Q breakfast   insulin aspart  0-15 Units Subcutaneous TID WC   insulin aspart  0-5 Units Subcutaneous QHS   insulin glargine  20 Units Subcutaneous Daily   losartan  25 mg Oral Daily   metoprolol succinate  50 mg Oral Daily   pantoprazole  40 mg Oral Daily   polyethylene glycol  17 g Oral Daily   sodium chloride flush  3 mL Intravenous Q12H   Continuous Infusions:  sodium chloride       LOS: 1 day        Hosie Poisson, MD [facility]   To contact the attending provider between 7A-7P or the covering provider during after hours 7P-7A, please log into the web site [email or web address] and access using universal [place] password for that web site. If you do not have the password, please call the hospital operator.  06/21/2020, 10:50 AM

## 2020-06-22 ENCOUNTER — Other Ambulatory Visit: Payer: Self-pay | Admitting: Physician Assistant

## 2020-06-22 ENCOUNTER — Inpatient Hospital Stay (HOSPITAL_COMMUNITY): Payer: Medicare Other | Admitting: Anesthesiology

## 2020-06-22 ENCOUNTER — Inpatient Hospital Stay (HOSPITAL_COMMUNITY): Payer: Medicare Other

## 2020-06-22 DIAGNOSIS — R55 Syncope and collapse: Secondary | ICD-10-CM

## 2020-06-22 DIAGNOSIS — R791 Abnormal coagulation profile: Secondary | ICD-10-CM

## 2020-06-22 DIAGNOSIS — I4891 Unspecified atrial fibrillation: Secondary | ICD-10-CM

## 2020-06-22 DIAGNOSIS — I50812 Chronic right heart failure: Secondary | ICD-10-CM

## 2020-06-22 DIAGNOSIS — I469 Cardiac arrest, cause unspecified: Secondary | ICD-10-CM

## 2020-06-22 DIAGNOSIS — N179 Acute kidney failure, unspecified: Secondary | ICD-10-CM

## 2020-06-22 DIAGNOSIS — Z7901 Long term (current) use of anticoagulants: Secondary | ICD-10-CM | POA: Diagnosis not present

## 2020-06-22 DIAGNOSIS — I110 Hypertensive heart disease with heart failure: Secondary | ICD-10-CM

## 2020-06-22 DIAGNOSIS — I272 Pulmonary hypertension, unspecified: Secondary | ICD-10-CM

## 2020-06-22 LAB — GLUCOSE, CAPILLARY
Glucose-Capillary: 138 mg/dL — ABNORMAL HIGH (ref 70–99)
Glucose-Capillary: 114 mg/dL — ABNORMAL HIGH (ref 70–99)
Glucose-Capillary: 94 mg/dL (ref 70–99)
Glucose-Capillary: 156 mg/dL — ABNORMAL HIGH (ref 70–99)
Glucose-Capillary: 171 mg/dL — ABNORMAL HIGH (ref 70–99)

## 2020-06-22 LAB — HEMOGLOBIN A1C
Hgb A1c MFr Bld: 8.2 % — ABNORMAL HIGH (ref 4.8–5.6)
Mean Plasma Glucose: 189 mg/dL

## 2020-06-22 LAB — BASIC METABOLIC PANEL
Anion gap: 5 (ref 5–15)
BUN: 56 mg/dL — ABNORMAL HIGH (ref 8–23)
CO2: 25 mmol/L (ref 22–32)
Calcium: 9 mg/dL (ref 8.9–10.3)
Chloride: 107 mmol/L (ref 98–111)
Creatinine, Ser: 2.38 mg/dL — ABNORMAL HIGH (ref 0.61–1.24)
GFR, Estimated: 29 mL/min — ABNORMAL LOW (ref >60)
Glucose, Bld: 123 mg/dL — ABNORMAL HIGH (ref 70–99)
Potassium: 4.6 mmol/L (ref 3.5–5.1)
Sodium: 137 mmol/L (ref 135–145)

## 2020-06-22 LAB — CBC WITH DIFFERENTIAL/PLATELET
Abs Immature Granulocytes: 0.03 10*3/uL (ref 0.00–0.07)
Basophils Absolute: 0.1 10*3/uL (ref 0.0–0.1)
Basophils Relative: 2 %
Eosinophils Absolute: 0.2 10*3/uL (ref 0.0–0.5)
Eosinophils Relative: 4 %
HCT: 36.1 % — ABNORMAL LOW (ref 39.0–52.0)
Hemoglobin: 10.7 g/dL — ABNORMAL LOW (ref 13.0–17.0)
Immature Granulocytes: 1 %
Lymphocytes Relative: 8 %
Lymphs Abs: 0.5 10*3/uL — ABNORMAL LOW (ref 0.7–4.0)
MCH: 28.5 pg (ref 26.0–34.0)
MCHC: 29.6 g/dL — ABNORMAL LOW (ref 30.0–36.0)
MCV: 96.3 fL (ref 80.0–100.0)
Monocytes Absolute: 0.8 10*3/uL (ref 0.1–1.0)
Monocytes Relative: 13 %
Neutro Abs: 4.4 10*3/uL (ref 1.7–7.7)
Neutrophils Relative %: 72 %
Platelets: 126 10*3/uL — ABNORMAL LOW (ref 150–400)
RBC: 3.75 MIL/uL — ABNORMAL LOW (ref 4.22–5.81)
RDW: 16.9 % — ABNORMAL HIGH (ref 11.5–15.5)
WBC: 6 10*3/uL (ref 4.0–10.5)
nRBC: 0 % (ref 0.0–0.2)

## 2020-06-22 LAB — PROTIME-INR
INR: 2.5 — ABNORMAL HIGH (ref 0.8–1.2)
Prothrombin Time: 27 seconds — ABNORMAL HIGH (ref 11.4–15.2)

## 2020-06-22 MED ORDER — SALINE SPRAY 0.65 % NA SOLN
1.0000 | NASAL | Status: DC | PRN
  Administered 2020-06-22: 1 via NASAL
  Filled 2020-06-22: qty 44

## 2020-06-22 MED ORDER — LACTATED RINGERS IV SOLN: INTRAVENOUS | Status: DC

## 2020-06-22 NOTE — Evaluation (Signed)
Physical Therapy Evaluation Patient Details Name: William Flores MRN: [id number] DOB: June 20, 1950 Today's Date: 06/22/2020   History of Present Illness  Pt adm 6/17 after syncopal vs cardiac arrest episode in lobby of MD office. Work up in progress. Pt also found to be anemic with heme positive stool so GI work up also in progress. PMH - chf, afib, dm, cad, colon CA, OSA, htn  Clinical Impression  Pt presents to PT with decr mobility due to pain after CPR. Expect pt's mobility to progress as pain and soreness improve. Expect he should be able to return home if he makes expected progress.     Follow Up Recommendations Home health PT    Equipment Recommendations  None recommended by PT    Recommendations for Other Services       Precautions / Restrictions Precautions Precautions: Fall      Mobility  Bed Mobility Overal bed mobility: Needs Assistance Bed Mobility: Supine to Sit     Supine to sit: Mod assist     General bed mobility comments: Assist to elevate trunk into sitting. Attempted to have pt come to sidelying and then sit    Transfers Overall transfer level: Needs assistance Equipment used: Rolling walker (2 wheeled) Transfers: Sit to/from Stand Sit to Stand: Supervision         General transfer comment: Assist for safety  Ambulation/Gait Ambulation/Gait assistance: Min guard Gait Distance (Feet): 2 Feet Assistive device: Rolling walker (2 wheeled) Gait Pattern/deviations: Step-to pattern;Decreased step length - right;Decreased step length - left Gait velocity: decr Gait velocity interpretation: <1.31 ft/sec, indicative of household ambulator General Gait Details: Assist for safety and lines. Pt side stepped up side of bed  Stairs            Wheelchair Mobility    Modified Rankin (Stroke Patients Only)       Balance Overall balance assessment: Mild deficits observed, not formally tested                                            Pertinent Vitals/Pain Pain Assessment: Faces Faces Pain Scale: Hurts even more Pain Location: chest Pain Descriptors / Indicators: Sore Pain Intervention(s): Limited activity within patient's tolerance;Monitored during session    Home Living Family/patient expects to be discharged to:: Private residence Living Arrangements: Alone Available Help at Discharge: Family;Available PRN/intermittently (step daughter) Type of Home: Mobile home Home Access: Stairs to enter Entrance Stairs-Rails: Right;Left;Can reach both Entrance Stairs-Number of Steps: 4 Home Layout: One level Home Equipment: Walker - 2 wheels;Walker - 4 wheels;Cane - single point Additional Comments: Pt on home O2 most of time    Prior Function Level of Independence: Independent with assistive device(s)         Comments: uses walker or rollator as needed     Hand Dominance   Dominant Hand: Right    Extremity/Trunk Assessment   Upper Extremity Assessment Upper Extremity Assessment: Overall WFL for tasks assessed    Lower Extremity Assessment Lower Extremity Assessment: Overall WFL for tasks assessed       Communication   Communication: No difficulties  Cognition Arousal/Alertness: Awake/alert Behavior During Therapy: WFL for tasks assessed/performed Overall Cognitive Status: Within Functional Limits for tasks assessed  General Comments General comments (skin integrity, edema, etc.): VSS on 2L    Exercises     Assessment/Plan    PT Assessment Patient needs continued PT services  PT Problem List Decreased activity tolerance;Decreased mobility;Pain;Obesity       PT Treatment Interventions DME instruction;Gait training;Stair training;Functional mobility training;Therapeutic activities;Therapeutic exercise;Patient/family education    PT Goals (Current goals can be found in the Care Plan section)  Acute Rehab PT Goals Patient Stated  Goal: decr soreness PT Goal Formulation: With patient Time For Goal Achievement: 07/06/20 Potential to Achieve Goals: Good    Frequency Min 3X/week   Barriers to discharge Decreased caregiver support lives alone    Co-evaluation               AM-PAC PT "6 Clicks" Mobility  Outcome Measure Help needed turning from your back to your side while in a flat bed without using bedrails?: A Little Help needed moving from lying on your back to sitting on the side of a flat bed without using bedrails?: A Lot Help needed moving to and from a bed to a chair (including a wheelchair)?: A Little Help needed standing up from a chair using your arms (e.g., wheelchair or bedside chair)?: A Little Help needed to walk in hospital room?: A Little Help needed climbing 3-5 steps with a railing? : A Little 6 Click Score: 17    End of Session Equipment Utilized During Treatment: Oxygen Activity Tolerance: Patient limited by pain Patient left: in bed;with call bell/phone within reach;with bed alarm set (sitting EOB) Nurse Communication: Mobility status PT Visit Diagnosis: Other abnormalities of gait and mobility (R26.89);Pain Pain - part of body:  (chest)    Time: 1540-1601 PT Time Calculation (min) (ACUTE ONLY): 21 min   Charges:   PT Evaluation $PT Eval Moderate Complexity: 1 [place] Pager [phone] Office [place] 06/22/2020, 5:29 PM

## 2020-06-22 NOTE — Progress Notes (Signed)
Patient refused CPAP for the night  

## 2020-06-22 NOTE — Progress Notes (Signed)
Progress Note  Patient Name: William Flores Date of Encounter: 06/22/2020  [facility] Cardiologist: Minus Breeding, MD   Subjective   Chest soreness improving. Sleepy this morning.   Inpatient Medications    Scheduled Meds:  atorvastatin  20 mg Oral Daily   ferrous sulfate  325 mg Oral Q breakfast   insulin aspart  0-15 Units Subcutaneous TID WC   insulin aspart  0-5 Units Subcutaneous QHS   insulin glargine  20 Units Subcutaneous Daily   metoprolol succinate  50 mg Oral Daily   pantoprazole  40 mg Oral Daily   polyethylene glycol  17 g Oral Daily   sodium chloride flush  3 mL Intravenous Q12H   Continuous Infusions:  sodium chloride     PRN Meds: sodium chloride, benzonatate, morphine injection, sodium chloride flush   Vital Signs    Vitals:   06/21/20 2026 06/21/20 2358 06/22/20 0441 06/22/20 0442  BP: 127/87 114/74 135/86   Pulse: 95 96 99   Resp: 20 20 20    Temp: 97.6 F (36.4 C) 97.9 F (36.6 C) 98.1 F (36.7 C)   TempSrc: Oral Oral Oral   SpO2: 93% 96% 95%   Weight:    124 kg  Height:        Intake/Output Summary (Last 24 hours) at 06/22/2020 0756 Last data filed at 06/22/2020 0247 Gross per 24 hour  Intake 973 ml  Output 1650 ml  Net -677 ml   Last 3 Weights 06/22/2020 06/21/2020 06/19/2020  Weight (lbs) 273 lb 5.9 oz 272 lb 0.8 oz 265 lb  Weight (kg) 124 kg 123.4 kg 120.203 kg      Telemetry    Afib with PVCs Personally Reviewed  ECG    N/a  Physical Exam   GEN: No acute distress.   Neck: No JVD Cardiac: Irregularly irregular,  no murmurs, rubs, or gallops.  Respiratory: Clear to auscultation bilaterally. GI: Soft, nontender, non-distended  MS: No edema; No deformity. Neuro:  Nonfocal  Psych: Normal affect   Labs    High Sensitivity Troponin:   Recent Labs  Lab 06/19/20 1550 06/19/20 1756  TROPONINIHS 10 19*      Chemistry Recent Labs  Lab 06/19/20 1550 06/20/20 0248 06/21/20 0222  NA 141 140 139  K 3.1* 4.3 4.8   CL 113* 108 106  CO2 20* 22 25  GLUCOSE 153* 166* 153*  BUN 39* 49* 54*  CREATININE 1.77* 2.23* 2.41*  CALCIUM 7.0* 9.2 9.1  PROT 4.7*  --  6.6  ALBUMIN 2.4*  --  3.2*  AST 13*  --  16  ALT 11  --  10  ALKPHOS 49  --  63  BILITOT 0.7  --  0.9  GFRNONAA 41* 31* 29*  ANIONGAP 8 10 8      Hematology Recent Labs  Lab 06/19/20 1550 06/20/20 0248 06/21/20 0222  WBC 5.3 5.7 6.6  RBC 3.15* 3.60* 3.81*  HGB 9.0* 10.3* 11.1*  HCT 30.6* 34.8* 36.6*  MCV 97.1 96.7 96.1  MCH 28.6 28.6 29.1  MCHC 29.4* 29.6* 30.3  RDW 16.7* 16.7* 16.9*  PLT 86* 121* 105*    BNP Recent Labs  Lab 06/19/20 1630  BNP 308.6*     DDimer No results for input(s): DDIMER in the last 168 hours.   Radiology    US RENAL  Result Date: 06/21/2020 CLINICAL DATA:  Acute kidney injury. Patient with history of chronic kidney disease. EXAM: RENAL / URINARY TRACT ULTRASOUND COMPLETE COMPARISON:  Noncontrast abdominal CT 12/26/2018 FINDINGS: Right Kidney: Renal measurements: 13.6 x 7.1 x 7.3 cm = volume: 360 mL. Lobulated renal contours with mild parenchymal thinning. No hydronephrosis. No evidence of stone or focal renal lesion. Left Kidney: Renal measurements: 12.8 x 8.0 x 7.5 cm = volume: 402 mL. Mild parenchymal thinning. No hydronephrosis. Technically limited evaluation due to habitus and patient's difficulty with breath hold. No obvious focal lesion. Bladder: Appears normal for degree of bladder distention. Other: None. IMPRESSION: 1. No hydronephrosis. 2. Bilateral renal parenchymal thinning. Technically limited evaluation, particularly of the left kidney, due to habitus. Electronically Signed   By: Keith Rake M.D.   On: 06/21/2020 15:03   ECHOCARDIOGRAM COMPLETE  Result Date: 06/21/2020    ECHOCARDIOGRAM REPORT   Patient Name:   William Flores Date of Exam: 06/21/2020 Medical Rec #:  [id number]      Height:       63.0 in Accession #:    [id number]     Weight:       272.0 lb Date of Birth:  1950-08-23       BSA:          2.204 m Patient Age:    70 years       BP:           115/80 mmHg Patient Gender: M              HR:           100 bpm. Exam Location:  Inpatient Procedure: 2D Echo, Cardiac Doppler, Color Doppler and Intracardiac            Opacification Agent Indications:    R55 Syncope  History:        Patient has prior history of Echocardiogram examinations, most                 recent 04/01/2019. Cardiomyopathy and CHF, CAD, Arrythmias:Atrial                 Fibrillation; Risk Factors:Diabetes, Dyslipidemia and                 Hypertension.  Sonographer:    Bernadene Person RDCS Referring Phys: [id number] Nicolette Bang  Sonographer Comments: Patient is morbidly obese. Image acquisition challenging due to patient body habitus and Image acquisition challenging due to respiratory motion. IMPRESSIONS  1. Technically difficult study despite Definity contrast  2. Left ventricular ejection fraction, by estimation, is 50 to 55%. The left ventricle has low normal function. Left ventricular endocardial border not optimally defined to evaluate regional wall motion. There is mild left ventricular hypertrophy. Left ventricular diastolic function could not be evaluated. There is the interventricular septum is flattened in systole and diastole, consistent with right ventricular pressure and volume overload.  3. Right ventricular systolic function is severely reduced. The right ventricular size is severely enlarged. There is severely elevated pulmonary artery systolic pressure. The estimated right ventricular systolic pressure is 79.3 mmHg.  4. The mitral valve is abnormal. Mild mitral valve regurgitation.  5. The aortic valve is tricuspid. Aortic valve regurgitation is not visualized. Mild aortic valve sclerosis is present, with no evidence of aortic valve stenosis.  6. The inferior vena cava is dilated in size with <50% respiratory variability, suggesting right atrial pressure of 15 mmHg. Comparison(s): Changes from prior  study are noted. 04/01/2019: LVEF 55-60%, severe RV dysfunction and dilitation - RVSP 76 mmHg. FINDINGS  Left Ventricle: Left ventricular ejection fraction, by estimation, is 50  to 55%. The left ventricle has low normal function. Left ventricular endocardial border not optimally defined to evaluate regional wall motion. Definity contrast agent was given IV  to delineate the left ventricular endocardial borders. The left ventricular internal cavity size was normal in size. There is mild left ventricular hypertrophy. The interventricular septum is flattened in systole and diastole, consistent with right ventricular pressure and volume overload. Left ventricular diastolic function could not be evaluated due to atrial fibrillation. Left ventricular diastolic function could not be evaluated. Right Ventricle: The right ventricular size is severely enlarged. Right vetricular wall thickness was not well visualized. Right ventricular systolic function is severely reduced. There is severely elevated pulmonary artery systolic pressure. The tricuspid regurgitant velocity is 4.34 m/s, and with an assumed right atrial pressure of 15 mmHg, the estimated right ventricular systolic pressure is 81.8 mmHg. Left Atrium: Left atrial size was normal in size. Right Atrium: Right atrial size was normal in size. Pericardium: There is no evidence of pericardial effusion. Mitral Valve: The mitral valve is abnormal. There is mild thickening of the mitral valve leaflet(s). There is moderate calcification of the posterior mitral valve leaflet(s). Mild mitral annular calcification. Mild mitral valve regurgitation. Tricuspid Valve: The tricuspid valve is not well visualized. Tricuspid valve regurgitation is trivial. Aortic Valve: The aortic valve is tricuspid. Aortic valve regurgitation is not visualized. Mild aortic valve sclerosis is present, with no evidence of aortic valve stenosis. Pulmonic Valve: The pulmonic valve was not well visualized.  Pulmonic valve regurgitation is not visualized. Aorta: The aortic root and ascending aorta are structurally normal, with no evidence of dilitation. Venous: The inferior vena cava is dilated in size with less than 50% respiratory variability, suggesting right atrial pressure of 15 mmHg. IAS/Shunts: No atrial level shunt detected by color flow Doppler.  LEFT VENTRICLE PLAX 2D LVIDd:         4.90 cm LVIDs:         3.70 cm LV PW:         1.00 cm LV IVS:        1.20 cm LVOT diam:     2.10 cm LV SV:         64 LV SV Index:   29 LVOT Area:     3.46 cm  RIGHT VENTRICLE TAPSE (M-mode): 0.9 cm LEFT ATRIUM           Index       RIGHT ATRIUM           Index LA diam:      4.90 cm 2.22 cm/m  RA Area:     18.90 cm LA Vol (A2C): 61.7 ml 28.00 ml/m RA Volume:   47.50 ml  21.55 ml/m LA Vol (A4C): 63.8 ml 28.95 ml/m  AORTIC VALVE LVOT Vmax:   118.00 cm/s LVOT Vmean:  75.433 cm/s LVOT VTI:    0.184 m  AORTA Ao Root diam: 3.20 cm Ao Asc diam:  3.30 cm TRICUSPID VALVE TR Peak grad:   75.3 mmHg TR Vmax:        434.00 cm/s  SHUNTS Systemic VTI:  0.18 m Systemic Diam: 2.10 cm Lyman Bishop MD Electronically signed by Lyman Bishop MD Signature Date/Time: 06/21/2020/12:27:18 PM    Final     Cardiac Studies   Echo 06/22/2018 1. Technically difficult study despite Definity contrast   2. Left ventricular ejection fraction, by estimation, is 50 to 55%. The  left ventricle has low normal function. Left ventricular endocardial  border not  optimally defined to evaluate regional wall motion. There is  mild left ventricular hypertrophy. Left  ventricular diastolic function could not be evaluated. There is the  interventricular septum is flattened in systole and diastole, consistent  with right ventricular pressure and volume overload.   3. Right ventricular systolic function is severely reduced. The right  ventricular size is severely enlarged. There is severely elevated  pulmonary artery systolic pressure. The estimated right  ventricular  systolic pressure is 64.3 mmHg.   4. The mitral valve is abnormal. Mild mitral valve regurgitation.   5. The aortic valve is tricuspid. Aortic valve regurgitation is not  visualized. Mild aortic valve sclerosis is present, with no evidence of  aortic valve stenosis.   6. The inferior vena cava is dilated in size with <50% respiratory  variability, suggesting right atrial pressure of 15 mmHg.   Comparison(s): Changes from prior study are noted. 04/01/2019: LVEF 55-60%,  severe RV dysfunction and dilitation - RVSP 76 mmHg.   Patient Profile     70 y.o. male with history of chronic atrial fibrillation, chronic anticoagulation therapy w/ coumadin, h/o systolic HF (felt to be tachy-mediated, w/ eventual recovery of LVEF), chronic diastolic CHF, RV failure w/ pulmonary HTN, Diabetes, Obesity, OHS/OSA w/ poor compliance w/ CPAP, h/o AKI, h/o GIB, colon cancer treated w/ resection, HTN, DM and h/o MGUS seen for evaluation of syncope vs cardiac arrest at the request of Dr. Karleen Hampshire.  While checking in for a regularly scheduled PCP visit, the patient lost consciousness in the lobby.  He was assisted to the ground.  CPR was administered and AED pads applied.  No shock was warranted.  Patient regained consciousness and ROSC obtained within less than a minute.  Patient placed on 2 L nasal cannula and transported to [facility].  Assessment & Plan    Syncope vs cardiac arrest - S/p CPR but no shock.  - Dr. Percival Spanish did not suspected ischemia or primary arrhthymias - Recommended outpatient stress test given chest wall pain - Echo technically difficult study despite Definity contrast showing LVEF of 50-55% - Will need 4 weeks of event monitor - Recommended VQ scan given worsening RVSP  2. Cor Pulmonale/RV failure - Echo showed severely reduce RV function with RSVP of 90.36m HG (was 76 mmHg on 03/2019). ? Compliance with CPAP at night. No evidence of pulmonary embolism by VQ scan on  04/2019.  3. Chronic atrial fibrillation - Rate controlled. Coumadin on hold due to anemia/GI bleed.   4. AKI - SCr 1.77>>2.23>>2.41>> pending BMP this morning - Torsemide on hold  5. Acute blood loss anemia - HGb relatively stable -Plan for EGD/Colonoscopy once INR less than 2   For questions or updates, please contact [email or web address] consult [email or web address] for contact info under        Signed,Leanor Kail PA  06/22/2020, 7:56 AM

## 2020-06-22 NOTE — Anesthesia Preprocedure Evaluation (Deleted)
Anesthesia Evaluation  Patient identified by MRN, date of birth, ID band Patient awake    Reviewed: Allergy & Precautions, H&P , NPO status , Patient's Chart, lab work & pertinent test results  Airway Mallampati: II  TM Distance: >3 FB Neck ROM: Full    Dental no notable dental hx. (+) Teeth Intact, Poor Dentition, Dental Advisory Given, Missing   Pulmonary neg pulmonary ROS, sleep apnea , former smoker,    Pulmonary exam normal breath sounds clear to auscultation       Cardiovascular Exercise Tolerance: Good hypertension, Pt. on medications and Pt. on home beta blockers + CAD  negative cardio ROS Normal cardiovascular exam+ dysrhythmias Atrial Fibrillation  Rhythm:Regular Rate:Normal  ECHO 6/22 1. Technically difficult study despite Definity contrast 2. Left ventricular ejection fraction, by estimation, is 50 to 55%. The left ventricle has low normal function. Left ventricular endocardial border not optimally defined to evaluate regional wall motion. There is mild left ventricular hypertrophy. Left ventricular diastolic function could not be evaluated. There is the interventricular septum is flattened in systole and diastole, consistent with right ventricular pressure and volume overload. 3. Right ventricular systolic function is severely reduced. The right ventricular size is severely enlarged. There is severely elevated pulmonary artery systolic pressure. The estimated right ventricular systolic pressure is 37.6 mmHg. 4. The mitral valve is abnormal. Mild mitral valve regurgitation. 5. The aortic valve is tricuspid. Aortic valve regurgitation is not visualized. Mild aortic valve sclerosis is present, with no evidence of aortic valve stenosis. 6. The inferior vena cava is dilated in size with <50% respiratory variability, suggesting right atrial pressure of 15 mmHg.   Neuro/Psych negative neurological ROS  negative psych ROS    GI/Hepatic negative GI ROS, Neg liver ROS,   Endo/Other  negative endocrine ROSdiabetes, Type 2, Insulin DependentMorbid obesity  Renal/GU CRFRenal diseasenegative Renal ROS  negative genitourinary   Musculoskeletal negative musculoskeletal ROS (+)   Abdominal   Peds negative pediatric ROS (+)  Hematology negative hematology ROS (+) Blood dyscrasia, anemia ,   Anesthesia Other Findings   Reproductive/Obstetrics negative OB ROS                            Anesthesia Physical Anesthesia Plan  ASA: 4  Anesthesia Plan: MAC   Post-op Pain Management:    Induction: Intravenous  PONV Risk Score and Plan: 1  Airway Management Planned: Mask, Natural Airway and Simple Face Mask  Additional Equipment: None  Intra-op Plan:   Post-operative Plan:   Informed Consent: I have reviewed the patients History and Physical, chart, labs and discussed the procedure including the risks, benefits and alternatives for the proposed anesthesia with the patient or authorized representative who has indicated his/her understanding and acceptance.       Plan Discussed with: Anesthesiologist and CRNA  Anesthesia Plan Comments:         Anesthesia Quick Evaluation

## 2020-06-22 NOTE — Plan of Care (Signed)
  Problem: Pain Managment: Goal: General experience of comfort will improve Outcome: Not Progressing Pt continues to complain of moderate pain with movement and coughing post CPR.

## 2020-06-22 NOTE — Progress Notes (Signed)
PT Cancellation Note  Patient Details Name: William Flores MRN: [id number] DOB: 16-Jul-1950   Cancelled Treatment:    Reason Eval/Treat Not Completed: Other (comment). Pt reports he just returned to supine and wants to rest. Will check back in PM.   Shary Decamp [facility] 06/22/2020, 11:58 AM [place] Pager [phone] Office [phone]

## 2020-06-22 NOTE — Progress Notes (Signed)
Shared Decision Making/Informed Consent The risks [chest pain, shortness of breath, cardiac arrhythmias, dizziness, blood pressure fluctuations, myocardial infarction, stroke/transient ischemic attack, nausea, vomiting, allergic reaction, radiation exposure, metallic taste sensation and life-threatening complications (estimated to be 1 in 10,000)], benefits (risk stratification, diagnosing coronary artery disease, treatment guidance) and alternatives of a nuclear stress test were discussed in detail with Mr. Dunnam and he agrees to proceed.

## 2020-06-22 NOTE — Progress Notes (Signed)
Patient ID: William Flores, male   DOB: [birth year], 70 y.o.   MRN: [id number]    Progress Note   Subjective   Day # 3 CC; anemia and heme positive stool, witnessed cardiac arrest?/syncope  Coumadin on hold-last dose 06/18/2020  INR 2.5 Hemoglobin 10.7/hematocrit 36.1/platelets 126 post transfusions Creatinine 2.38   Ferritin 48/iron 36  2D echo-EF 50 to 55%, severe right ventricular dysfunction VQ scan pending-recommended by cards To have stress test-outpatient and 30-day event monitor  Patient feeling poorly today, having a lot of chest pain and requiring morphine.  Chest pain felt musculoskeletal secondary to CPR He does not feel up to taking a bowel prep today    Objective   Vital signs in last 24 hours: Temp:  [97.6 F (36.4 C)-98.3 F (36.8 C)] 98.1 F (36.7 C) (06/20 0441) Pulse Rate:  [90-99] 99 (06/20 0441) Resp:  [18-20] 20 (06/20 0441) BP: (114-135)/(74-87) 135/86 (06/20 0441) SpO2:  [93 %-96 %] 95 % (06/20 0441) Weight:  [132 kg] 124 kg (06/20 0442) Last BM Date: 06/18/20 General:    white male in NAD, uncomfortable appearing and sleepy Heart:  Regular rate and rhythm; no murmurs Lungs: Respirations even and unlabored, lungs CTA bilaterally Abdomen:  Soft, obese ,nontender and nondistended. Normal bowel sounds. Extremities:  Without edema.,  Bluish discoloration feet Neurologic:  Alert and oriented,  grossly normal neurologically. Psych:  Cooperative. Normal mood and affect.  Intake/Output from previous day: 06/19 0701 - 06/20 0700 In: 973 [P.O.:973] Out: 1650 [Urine:1650] Intake/Output this shift: Total I/O In: -  Out: 150 [Urine:150]  Lab Results: Recent Labs    06/20/20 0248 06/21/20 0222 06/22/20 0934  WBC 5.7 6.6 6.0  HGB 10.3* 11.1* 10.7*  HCT 34.8* 36.6* 36.1*  PLT 121* 105* 126*   BMET Recent Labs    06/20/20 0248 06/21/20 0222 06/22/20 0934  NA 140 139 137  K 4.3 4.8 4.6  CL 108 106 107  CO2 22 25 25   GLUCOSE 166* 153* 123*   BUN 49* 54* 56*  CREATININE 2.23* 2.41* 2.38*  CALCIUM 9.2 9.1 9.0   LFT Recent Labs    06/21/20 0222  PROT 6.6  ALBUMIN 3.2*  AST 16  ALT 10  ALKPHOS 63  BILITOT 0.9   PT/INR Recent Labs    06/21/20 0924 06/22/20 0934  LABPROT 28.4* 27.0*  INR 2.7* 2.5*    Studies/Results: US RENAL  Result Date: 06/21/2020 CLINICAL DATA:  Acute kidney injury. Patient with history of chronic kidney disease. EXAM: RENAL / URINARY TRACT ULTRASOUND COMPLETE COMPARISON:  Noncontrast abdominal CT 12/26/2018 FINDINGS: Right Kidney: Renal measurements: 13.6 x 7.1 x 7.3 cm = volume: 360 mL. Lobulated renal contours with mild parenchymal thinning. No hydronephrosis. No evidence of stone or focal renal lesion. Left Kidney: Renal measurements: 12.8 x 8.0 x 7.5 cm = volume: 402 mL. Mild parenchymal thinning. No hydronephrosis. Technically limited evaluation due to habitus and patient's difficulty with breath hold. No obvious focal lesion. Bladder: Appears normal for degree of bladder distention. Other: None. IMPRESSION: 1. No hydronephrosis. 2. Bilateral renal parenchymal thinning. Technically limited evaluation, particularly of the left kidney, due to habitus. Electronically Signed   By: Keith Rake M.D.   On: 06/21/2020 15:03   ECHOCARDIOGRAM COMPLETE  Result Date: 06/21/2020    ECHOCARDIOGRAM REPORT   Patient Name:   William Flores Date of Exam: 06/21/2020 Medical Rec #:  [id number]      Height:       63.0 in  Accession #:    [id number]     Weight:       272.0 lb Date of Birth:  1950/05/05      BSA:          2.204 m Patient Age:    30 years       BP:           115/80 mmHg Patient Gender: M              HR:           100 bpm. Exam Location:  Inpatient Procedure: 2D Echo, Cardiac Doppler, Color Doppler and Intracardiac            Opacification Agent Indications:    R55 Syncope  History:        Patient has prior history of Echocardiogram examinations, most                 recent 04/01/2019. Cardiomyopathy and  CHF, CAD, Arrythmias:Atrial                 Fibrillation; Risk Factors:Diabetes, Dyslipidemia and                 Hypertension.  Sonographer:    Bernadene Person RDCS Referring Phys: [id number] Nicolette Bang  Sonographer Comments: Patient is morbidly obese. Image acquisition challenging due to patient body habitus and Image acquisition challenging due to respiratory motion. IMPRESSIONS  1. Technically difficult study despite Definity contrast  2. Left ventricular ejection fraction, by estimation, is 50 to 55%. The left ventricle has low normal function. Left ventricular endocardial border not optimally defined to evaluate regional wall motion. There is mild left ventricular hypertrophy. Left ventricular diastolic function could not be evaluated. There is the interventricular septum is flattened in systole and diastole, consistent with right ventricular pressure and volume overload.  3. Right ventricular systolic function is severely reduced. The right ventricular size is severely enlarged. There is severely elevated pulmonary artery systolic pressure. The estimated right ventricular systolic pressure is 27.7 mmHg.  4. The mitral valve is abnormal. Mild mitral valve regurgitation.  5. The aortic valve is tricuspid. Aortic valve regurgitation is not visualized. Mild aortic valve sclerosis is present, with no evidence of aortic valve stenosis.  6. The inferior vena cava is dilated in size with <50% respiratory variability, suggesting right atrial pressure of 15 mmHg. Comparison(s): Changes from prior study are noted. 04/01/2019: LVEF 55-60%, severe RV dysfunction and dilitation - RVSP 76 mmHg. FINDINGS  Left Ventricle: Left ventricular ejection fraction, by estimation, is 50 to 55%. The left ventricle has low normal function. Left ventricular endocardial border not optimally defined to evaluate regional wall motion. Definity contrast agent was given IV  to delineate the left ventricular endocardial borders. The  left ventricular internal cavity size was normal in size. There is mild left ventricular hypertrophy. The interventricular septum is flattened in systole and diastole, consistent with right ventricular pressure and volume overload. Left ventricular diastolic function could not be evaluated due to atrial fibrillation. Left ventricular diastolic function could not be evaluated. Right Ventricle: The right ventricular size is severely enlarged. Right vetricular wall thickness was not well visualized. Right ventricular systolic function is severely reduced. There is severely elevated pulmonary artery systolic pressure. The tricuspid regurgitant velocity is 4.34 m/s, and with an assumed right atrial pressure of 15 mmHg, the estimated right ventricular systolic pressure is 41.2 mmHg. Left Atrium: Left atrial size was normal in size. Right Atrium: Right atrial size  was normal in size. Pericardium: There is no evidence of pericardial effusion. Mitral Valve: The mitral valve is abnormal. There is mild thickening of the mitral valve leaflet(s). There is moderate calcification of the posterior mitral valve leaflet(s). Mild mitral annular calcification. Mild mitral valve regurgitation. Tricuspid Valve: The tricuspid valve is not well visualized. Tricuspid valve regurgitation is trivial. Aortic Valve: The aortic valve is tricuspid. Aortic valve regurgitation is not visualized. Mild aortic valve sclerosis is present, with no evidence of aortic valve stenosis. Pulmonic Valve: The pulmonic valve was not well visualized. Pulmonic valve regurgitation is not visualized. Aorta: The aortic root and ascending aorta are structurally normal, with no evidence of dilitation. Venous: The inferior vena cava is dilated in size with less than 50% respiratory variability, suggesting right atrial pressure of 15 mmHg. IAS/Shunts: No atrial level shunt detected by color flow Doppler.  LEFT VENTRICLE PLAX 2D LVIDd:         4.90 cm LVIDs:         3.70  cm LV PW:         1.00 cm LV IVS:        1.20 cm LVOT diam:     2.10 cm LV SV:         64 LV SV Index:   29 LVOT Area:     3.46 cm  RIGHT VENTRICLE TAPSE (M-mode): 0.9 cm LEFT ATRIUM           Index       RIGHT ATRIUM           Index LA diam:      4.90 cm 2.22 cm/m  RA Area:     18.90 cm LA Vol (A2C): 61.7 ml 28.00 ml/m RA Volume:   47.50 ml  21.55 ml/m LA Vol (A4C): 63.8 ml 28.95 ml/m  AORTIC VALVE LVOT Vmax:   118.00 cm/s LVOT Vmean:  75.433 cm/s LVOT VTI:    0.184 m  AORTA Ao Root diam: 3.20 cm Ao Asc diam:  3.30 cm TRICUSPID VALVE TR Peak grad:   75.3 mmHg TR Vmax:        434.00 cm/s  SHUNTS Systemic VTI:  0.18 m Systemic Diam: 2.10 cm Lyman Bishop MD Electronically signed by Lyman Bishop MD Signature Date/Time: 06/21/2020/12:27:18 PM    Final        Assessment / Plan:    #3 70 year old white male admitted after syncope/question cardiac arrest which is felt to be syncope per cardiology but underwent CPR at PCP office  No acute MI Severe pulmonary hypertension  He is to have outpatient nuclear stress test and 30-day event monitor per cardiology  VQ scan recommended to rule out PE  #2 significant persistent chest wall pain secondary to CPR, requiring morphine  #3 anemia and heme positive stool in setting of chronic Coumadin Hemoglobin 9 on admission Baseline 12  Continue Protonix Hold Coumadin Plan is for EGD and colonoscopy this admission once INR less than 2  Prior work-up per Dr. Laural Golden December 2020 EGD negative and colonoscopy with removal of 11 polyps, biopsy showed to be tubular adenomas-2 polyps not removed because of peristalsis and repeat colonoscopy in 6 months was recommended   #4 chronic atrial fibrillation #5 acute kidney injury  Plan; patient has been cleared from cardiac standpoint, though increased risk for complications with sedation with right heart failure.  Patient does not feel that he can do a bowel prep today, will let Coumadin continue to washout and  plan bowel  prep tomorrow, then EGD and colonoscopy on Wednesday.  Defer to medicine team regarding pursuing VQ scan which was recommended by cardiology.      Active Problems:   Hypertensive heart disease   History of colon cancer   Morbid obesity ([place])   Right heart failure (HCC)   OSA (obstructive sleep apnea)   Nonischemic cardiomyopathy (HCC)   Anticoagulated on Coumadin   Atrial fibrillation with slow ventricular response (H[facility])   DM (diabetes mellitus), type 2, uncontrolled, with renal complications (HCC)   Normocytic anemia   CKD stage 3 due to type 2 diabetes mellitus ([place])   Hyperlipidemia associated with type 2 diabetes mellitus ([place])   Pulmonary hypertension ([place])   Syncope   Hypokalemia   Fecal occult blood test positive   AKI (acute kidney injury) ([place])   Syncope and collapse     LOS: 2 days   Tameah Mihalko  PA-C6/20/2022, 12:17 PM

## 2020-06-22 NOTE — Progress Notes (Signed)
PROGRESS NOTE  [name]  ONG:[id number] DOB: [birth year] DOA: 06/19/2020 PCP: [name], [name], MD   Brief Narrative: William Flores is a 70 y.o. male with a history of chronic 2L O2-dependent respiratory failure, IDA on iron supplement, colon CA s/p resection 2002, chronic atrial fibrillation on coumadin, CKD stage III, T2DM, HTN, HLD, OSA, chronic HFpEF who presented to the ED 6/17 from his PCP's office after an episode of syncope vs. cardiac arrest. Code was called, ROSC achieved in the office, no shockable rhythm detected. He was transported to [facility] where he complained of chest pain over the sternum. SCr 1.77, BUN 39, INR 3.6, hgb 9g/dl (baseline ~12), +FOBT, platelets 86k, ferritin 48, iron 36, WBC normal.    Assessment & Plan: Active Problems:   Hypertensive heart disease   History of colon cancer   Morbid obesity (William Flores)   Right heart failure (William Flores)   OSA (obstructive sleep apnea)   Nonischemic cardiomyopathy (William Flores)   Anticoagulated on Coumadin   Atrial fibrillation with slow ventricular response (William Flores)   DM (diabetes mellitus), type 2, uncontrolled, with renal complications (William Flores)   Normocytic anemia   CKD stage 3 due to type 2 diabetes mellitus (William Flores)   Hyperlipidemia associated with type 2 diabetes mellitus (William Flores)   Pulmonary hypertension (William Flores)   Syncope   Hypokalemia   Fecal occult blood test positive   AKI (acute kidney injury) (William Flores)   Syncope and collapse  Syncope vs. cardiac arrest: Echo shows worsening RV failure, pulmonary HTN, but no regional WMA and low-normal preserved LVEF. Mild troponin elevation due to demand ischemia.  - Cardiology consulted, planning nuclear stress testing as outpatient as well as 30-day event monitor. - Cardiology signed off, recommending to continue metoprolol, statin.   Acute blood loss anemia on chronic (unexplained) iron deficiency anemia: Suspect to be due to GI bleeding with +FOBT in setting of supratherapeutic INR.  - GI recommends  EGD, colonoscopy once INR < 2 then +/- VCE - Empiric PPI - Consider IV iron. Continue po iron.  Chronic atrial fibrillation, PVCs:  - Holding coumadin. INR down to 2.5.   Pulmonary HTN, RV failure, chronic HFpEF and history of chronic combined HFrEF (tachycardia-mediated and improved on recent echo):  - Cardiology restarted sildenafil.  - Check perfusion scan given worsening RV function/pulm HTN.   AKI on stage IIIa CKD: Hx of requiring CVVHD for AKI.  - Hold losartan, avoid hypotension. Renal US wtbilateral parenchymal thinning, no hydro. SCr remaining well above baseline and pt not taking adequate po. FENa 0.3%. Will give 50cc/hr IVF for now and monitor closely.   T2DM: HbA1c 8.2% - Continue SSI   Hypokalemia: Supplemented  HLD:  - Continue statin  Thrombocytopenia: Chronic  Chronic hypoxic respiratory failure, OSA: At baseline - Continue supplemental oxygen, etc.   Obesity: Estimated body mass index is 48.43 kg/m as calculated from the following:   Height as of this encounter: 5' 3"  (1.6 m).   Weight as of this encounter: 124 kg.  DVT prophylaxis: SCDs Code Status: Full Family Communication: None at bedside Disposition Plan:  Status is: Inpatient  Remains inpatient appropriate because:Ongoing diagnostic testing needed not appropriate for outpatient work up  Dispo: The patient is from: Home              Anticipated d/c is to: Home              Patient currently is not medically stable to d/c.   Difficult to place patient No  Consultants:  Cardiology GI  Procedures:  EGD, colonoscopy planned  Antimicrobials: None   Subjective: Chest discomfort is only complaints, but it is severe, constant, worse with any movement or light palpation. No dyspnea reported.   Objective: Vitals:   06/21/20 2026 06/21/20 2358 06/22/20 0441 06/22/20 0442  BP: 127/87 114/74 135/86   Pulse: 95 96 99   Resp: 20 20 20    Temp: 97.6 F (36.4 C) 97.9 F (36.6 C) 98.1 F (36.7 C)    TempSrc: Oral Oral Oral   SpO2: 93% 96% 95%   Weight:    124 kg  Height:        Intake/Output Summary (Last 24 hours) at 06/22/2020 1522 Last data filed at 06/22/2020 1037 Gross per 24 hour  Intake 733 ml  Output 1150 ml  Net -417 ml   Filed Weights   06/19/20 1517 06/21/20 0458 06/22/20 0442  Weight: 120.2 kg 123.4 kg 124 kg    Gen: 70 y.o. male in discomfort Pulm: Non-labored breathing 2L O2. Clear to auscultation bilaterally.  CV: Irreg irreg without murmur, rub, or gallop. No JVD, or pitting pedal edema. Severe tenderness to palpation along anterior chest without palpable or visible deformity.  GI: Abdomen soft, non-tender, non-distended, with normoactive bowel sounds. No organomegaly or masses felt. Ext: Warm, no deformities Skin: No rashes, lesions no ulcers Neuro: Alert and oriented. No focal neurological deficits. Psych: Judgement and insight appear normal. Mood & affect appropriate.   Data Reviewed: I have personally reviewed following labs and imaging studies  CBC: Recent Labs  Lab 06/19/20 1550 06/20/20 0248 06/21/20 0222 06/22/20 0934  WBC 5.3 5.7 6.6 6.0  NEUTROABS 4.4  --  4.9 4.4  HGB 9.0* 10.3* 11.1* 10.7*  HCT 30.6* 34.8* 36.6* 36.1*  MCV 97.1 96.7 96.1 96.3  PLT 86* 121* 105* 342*   Basic Metabolic Panel: Recent Labs  Lab 06/19/20 1550 06/19/20 2316 06/20/20 0248 06/21/20 0222 06/22/20 0934  NA 141  --  140 139 137  K 3.1*  --  4.3 4.8 4.6  CL 113*  --  108 106 107  CO2 20*  --  22 25 25   GLUCOSE 153*  --  166* 153* 123*  BUN 39*  --  49* 54* 56*  CREATININE 1.77*  --  2.23* 2.41* 2.38*  CALCIUM 7.0*  --  9.2 9.1 9.0  MG  --  2.1  --   --   --    GFR: Estimated Creatinine Clearance: 35.2 mL/min (A) (by C-G formula based on SCr of 2.38 mg/dL (H)). Liver Function Tests: Recent Labs  Lab 06/19/20 1550 06/21/20 0222  AST 13* 16  ALT 11 10  ALKPHOS 49 63  BILITOT 0.7 0.9  PROT 4.7* 6.6  ALBUMIN 2.4* 3.2*   No results for  input(s): LIPASE, AMYLASE in the last 168 hours. No results for input(s): AMMONIA in the last 168 hours. Coagulation Profile: Recent Labs  Lab 06/19/20 1558 06/20/20 0248 06/21/20 0924 06/22/20 0934  INR 3.6* 3.3* 2.7* 2.5*   Cardiac Enzymes: No results for input(s): CKTOTAL, CKMB, CKMBINDEX, TROPONINI in the last 168 hours. BNP (last 3 results) No results for input(s): PROBNP in the last 8760 hours. HbA1C: Recent Labs    06/19/20 2316  HGBA1C 8.2*   CBG: Recent Labs  Lab 06/21/20 1146 06/21/20 1607 06/21/20 2215 06/22/20 0743 06/22/20 1139  GLUCAP 158* 149* 176* 138* 114*   Lipid Profile: No results for input(s): CHOL, HDL, LDLCALC, TRIG, CHOLHDL, LDLDIRECT in  the last 72 hours. Thyroid Function Tests: No results for input(s): TSH, T4TOTAL, FREET4, T3FREE, THYROIDAB in the last 72 hours. Anemia Panel: Recent Labs    06/19/20 2316  VITAMINB12 472  FOLATE 12.1  FERRITIN 48  TIBC 395  IRON 36*   Urine analysis:    Component Value Date/Time   COLORURINE YELLOW 06/21/2020 1059   APPEARANCEUR CLEAR 06/21/2020 1059   LABSPEC 1.020 06/21/2020 1059   PHURINE 5.5 06/21/2020 1059   GLUCOSEU NEGATIVE 06/21/2020 1059   HGBUR LARGE (A) 06/21/2020 1059   BILIRUBINUR NEGATIVE 06/21/2020 1059   KETONESUR NEGATIVE 06/21/2020 1059   PROTEINUR 100 (A) 06/21/2020 1059   UROBILINOGEN 0.2 05/16/2014 0822   NITRITE NEGATIVE 06/21/2020 1059   LEUKOCYTESUR NEGATIVE 06/21/2020 1059   Recent Results (from the past 240 hour(s))  SARS CORONAVIRUS 2 (TAT 6-24 HRS) Nasopharyngeal Urine, Clean Catch     Status: None   Collection Time: 06/19/20  6:38 PM   Specimen: Urine, Clean Catch; Nasopharyngeal  Result Value Ref Range Status   SARS Coronavirus 2 NEGATIVE NEGATIVE Final    Comment: (NOTE) SARS-CoV-2 target nucleic acids are NOT DETECTED.  The SARS-CoV-2 RNA is generally detectable in upper and lower respiratory specimens during the acute phase of infection. Negative results  do not preclude SARS-CoV-2 infection, do not rule out co-infections with other pathogens, and should not be used as the sole basis for treatment or other patient management decisions. Negative results must be combined with clinical observations, patient history, and epidemiological information. The expected result is Negative.  Fact Sheet for Patients: SugarRoll.be  Fact Sheet for Healthcare Providers: https://www.woods-mathews.com/  This test is not yet approved or cleared by the [place] FDA and  has been authorized for detection and/or diagnosis of SARS-CoV-2 by FDA under an Emergency Use Authorization (EUA). This EUA will remain  in effect (meaning this test can be used) for the duration of the COVID-19 declaration under Se ction 564(b)(1) of the Act, 21 U.S.C. section 360bbb-3(b)(1), unless the authorization is terminated or revoked sooner.  Performed at [facility], [place] [address], [place], [place] [address]       Radiology Studies: US RENAL  Result Date: 06/21/2020 CLINICAL DATA:  Acute kidney injury. Patient with history of chronic kidney disease. EXAM: RENAL / URINARY TRACT ULTRASOUND COMPLETE COMPARISON:  Noncontrast abdominal CT 12/26/2018 FINDINGS: Right Kidney: Renal measurements: 13.6 x 7.1 x 7.3 cm = volume: 360 mL. Lobulated renal contours with mild parenchymal thinning. No hydronephrosis. No evidence of stone or focal renal lesion. Left Kidney: Renal measurements: 12.8 x 8.0 x 7.5 cm = volume: 402 mL. Mild parenchymal thinning. No hydronephrosis. Technically limited evaluation due to habitus and patient's difficulty with breath hold. No obvious focal lesion. Bladder: Appears normal for degree of bladder distention. Other: None. IMPRESSION: 1. No hydronephrosis. 2. Bilateral renal parenchymal thinning. Technically limited evaluation, particularly of the left kidney, due to habitus. Electronically Signed   By: Keith Rake M.D.   On: 06/21/2020 15:03   ECHOCARDIOGRAM COMPLETE  Result Date: 06/21/2020    ECHOCARDIOGRAM REPORT   Patient Name:   FLAY GHOSH Date of Exam: 06/21/2020 Medical Rec #:  [id number]      Height:       63.0 in Accession #:    [id number]     Weight:       272.0 lb Date of Birth:  1950/04/30      BSA:  2.204 m Patient Age:    38 years       BP:           115/80 mmHg Patient Gender: M              HR:           100 bpm. Exam Location:  Inpatient Procedure: 2D Echo, Cardiac Doppler, Color Doppler and Intracardiac            Opacification Agent Indications:    R55 Syncope  History:        Patient has prior history of Echocardiogram examinations, most                 recent 04/01/2019. Cardiomyopathy and CHF, CAD, Arrythmias:Atrial                 Fibrillation; Risk Factors:Diabetes, Dyslipidemia and                 Hypertension.  Sonographer:    Bernadene Person RDCS Referring Phys: [id number] Nicolette Bang  Sonographer Comments: Patient is morbidly obese. Image acquisition challenging due to patient body habitus and Image acquisition challenging due to respiratory motion. IMPRESSIONS  1. Technically difficult study despite Definity contrast  2. Left ventricular ejection fraction, by estimation, is 50 to 55%. The left ventricle has low normal function. Left ventricular endocardial border not optimally defined to evaluate regional wall motion. There is mild left ventricular hypertrophy. Left ventricular diastolic function could not be evaluated. There is the interventricular septum is flattened in systole and diastole, consistent with right ventricular pressure and volume overload.  3. Right ventricular systolic function is severely reduced. The right ventricular size is severely enlarged. There is severely elevated pulmonary artery systolic pressure. The estimated right ventricular systolic pressure is 67.5 mmHg.  4. The mitral valve is abnormal. Mild mitral valve regurgitation.  5. The  aortic valve is tricuspid. Aortic valve regurgitation is not visualized. Mild aortic valve sclerosis is present, with no evidence of aortic valve stenosis.  6. The inferior vena cava is dilated in size with <50% respiratory variability, suggesting right atrial pressure of 15 mmHg. Comparison(s): Changes from prior study are noted. 04/01/2019: LVEF 55-60%, severe RV dysfunction and dilitation - RVSP 76 mmHg. FINDINGS  Left Ventricle: Left ventricular ejection fraction, by estimation, is 50 to 55%. The left ventricle has low normal function. Left ventricular endocardial border not optimally defined to evaluate regional wall motion. Definity contrast agent was given IV  to delineate the left ventricular endocardial borders. The left ventricular internal cavity size was normal in size. There is mild left ventricular hypertrophy. The interventricular septum is flattened in systole and diastole, consistent with right ventricular pressure and volume overload. Left ventricular diastolic function could not be evaluated due to atrial fibrillation. Left ventricular diastolic function could not be evaluated. Right Ventricle: The right ventricular size is severely enlarged. Right vetricular wall thickness was not well visualized. Right ventricular systolic function is severely reduced. There is severely elevated pulmonary artery systolic pressure. The tricuspid regurgitant velocity is 4.34 m/s, and with an assumed right atrial pressure of 15 mmHg, the estimated right ventricular systolic pressure is 91.6 mmHg. Left Atrium: Left atrial size was normal in size. Right Atrium: Right atrial size was normal in size. Pericardium: There is no evidence of pericardial effusion. Mitral Valve: The mitral valve is abnormal. There is mild thickening of the mitral valve leaflet(s). There is moderate calcification of the posterior mitral valve leaflet(s). Mild mitral  annular calcification. Mild mitral valve regurgitation. Tricuspid Valve: The  tricuspid valve is not well visualized. Tricuspid valve regurgitation is trivial. Aortic Valve: The aortic valve is tricuspid. Aortic valve regurgitation is not visualized. Mild aortic valve sclerosis is present, with no evidence of aortic valve stenosis. Pulmonic Valve: The pulmonic valve was not well visualized. Pulmonic valve regurgitation is not visualized. Aorta: The aortic root and ascending aorta are structurally normal, with no evidence of dilitation. Venous: The inferior vena cava is dilated in size with less than 50% respiratory variability, suggesting right atrial pressure of 15 mmHg. IAS/Shunts: No atrial level shunt detected by color flow Doppler.  LEFT VENTRICLE PLAX 2D LVIDd:         4.90 cm LVIDs:         3.70 cm LV PW:         1.00 cm LV IVS:        1.20 cm LVOT diam:     2.10 cm LV SV:         64 LV SV Index:   29 LVOT Area:     3.46 cm  RIGHT VENTRICLE TAPSE (M-mode): 0.9 cm LEFT ATRIUM           Index       RIGHT ATRIUM           Index LA diam:      4.90 cm 2.22 cm/m  RA Area:     18.90 cm LA Vol (A2C): 61.7 ml 28.00 ml/m RA Volume:   47.50 ml  21.55 ml/m LA Vol (A4C): 63.8 ml 28.95 ml/m  AORTIC VALVE LVOT Vmax:   118.00 cm/s LVOT Vmean:  75.433 cm/s LVOT VTI:    0.184 m  AORTA Ao Root diam: 3.20 cm Ao Asc diam:  3.30 cm TRICUSPID VALVE TR Peak grad:   75.3 mmHg TR Vmax:        434.00 cm/s  SHUNTS Systemic VTI:  0.18 m Systemic Diam: 2.10 cm Lyman Bishop MD Electronically signed by Lyman Bishop MD Signature Date/Time: 06/21/2020/12:27:18 PM    Final     Scheduled Meds:  atorvastatin  20 mg Oral Daily   ferrous sulfate  325 mg Oral Q breakfast   insulin aspart  0-15 Units Subcutaneous TID WC   insulin aspart  0-5 Units Subcutaneous QHS   insulin glargine  20 Units Subcutaneous Daily   metoprolol succinate  50 mg Oral Daily   pantoprazole  40 mg Oral Daily   polyethylene glycol  17 g Oral Daily   sodium chloride flush  3 mL Intravenous Q12H   Continuous Infusions:  sodium  chloride       LOS: 2 days   Time spent: 35 minutes.  Patrecia Pour, MD [facility] [email or web address] 06/22/2020, 3:22 PM

## 2020-06-23 ENCOUNTER — Encounter (HOSPITAL_COMMUNITY)
Admission: EM | Disposition: A | Discharge status: Skilled Nursing Facility | Payer: Self-pay | Source: Home / Self Care | Attending: Family Medicine

## 2020-06-23 ENCOUNTER — Encounter: Payer: Self-pay | Admitting: *Deleted

## 2020-06-23 ENCOUNTER — Encounter (HOSPITAL_COMMUNITY): Payer: Self-pay | Admitting: Internal Medicine

## 2020-06-23 ENCOUNTER — Inpatient Hospital Stay: Payer: Medicare Other

## 2020-06-23 DIAGNOSIS — D508 Other iron deficiency anemias: Secondary | ICD-10-CM

## 2020-06-23 LAB — CBC WITH DIFFERENTIAL/PLATELET
Abs Immature Granulocytes: 0.05 10*3/uL (ref 0.00–0.07)
Basophils Absolute: 0.1 10*3/uL (ref 0.0–0.1)
Basophils Relative: 1 %
Eosinophils Absolute: 0.3 10*3/uL (ref 0.0–0.5)
Eosinophils Relative: 4 %
HCT: 35.8 % — ABNORMAL LOW (ref 39.0–52.0)
Hemoglobin: 10.6 g/dL — ABNORMAL LOW (ref 13.0–17.0)
Immature Granulocytes: 1 %
Lymphocytes Relative: 7 %
Lymphs Abs: 0.4 10*3/uL — ABNORMAL LOW (ref 0.7–4.0)
MCH: 28.6 pg (ref 26.0–34.0)
MCHC: 29.6 g/dL — ABNORMAL LOW (ref 30.0–36.0)
MCV: 96.8 fL (ref 80.0–100.0)
Monocytes Absolute: 0.9 10*3/uL (ref 0.1–1.0)
Monocytes Relative: 13 %
Neutro Abs: 4.8 10*3/uL (ref 1.7–7.7)
Neutrophils Relative %: 74 %
Platelets: 120 10*3/uL — ABNORMAL LOW (ref 150–400)
RBC: 3.7 MIL/uL — ABNORMAL LOW (ref 4.22–5.81)
RDW: 16.8 % — ABNORMAL HIGH (ref 11.5–15.5)
WBC: 6.6 10*3/uL (ref 4.0–10.5)
nRBC: 0 % (ref 0.0–0.2)

## 2020-06-23 LAB — HEMOGLOBIN A1C
Hgb A1c MFr Bld: 8.2 % — ABNORMAL HIGH (ref 4.8–5.6)
Mean Plasma Glucose: 189 mg/dL

## 2020-06-23 LAB — PROTIME-INR
INR: 2.6 — ABNORMAL HIGH (ref 0.8–1.2)
Prothrombin Time: 28.1 seconds — ABNORMAL HIGH (ref 11.4–15.2)

## 2020-06-23 LAB — BASIC METABOLIC PANEL
Anion gap: 12 (ref 5–15)
BUN: 57 mg/dL — ABNORMAL HIGH (ref 8–23)
CO2: 22 mmol/L (ref 22–32)
Calcium: 9.1 mg/dL (ref 8.9–10.3)
Chloride: 103 mmol/L (ref 98–111)
Creatinine, Ser: 2.27 mg/dL — ABNORMAL HIGH (ref 0.61–1.24)
GFR, Estimated: 31 mL/min — ABNORMAL LOW (ref >60)
Glucose, Bld: 126 mg/dL — ABNORMAL HIGH (ref 70–99)
Potassium: 4.4 mmol/L (ref 3.5–5.1)
Sodium: 137 mmol/L (ref 135–145)

## 2020-06-23 LAB — GLUCOSE, CAPILLARY
Glucose-Capillary: 107 mg/dL — ABNORMAL HIGH (ref 70–99)
Glucose-Capillary: 135 mg/dL — ABNORMAL HIGH (ref 70–99)
Glucose-Capillary: 177 mg/dL — ABNORMAL HIGH (ref 70–99)
Glucose-Capillary: 153 mg/dL — ABNORMAL HIGH (ref 70–99)
Glucose-Capillary: 155 mg/dL — ABNORMAL HIGH (ref 70–99)

## 2020-06-23 SURGERY — CANCELLED PROCEDURE
Anesthesia: Monitor Anesthesia Care

## 2020-06-23 MED ORDER — ALBUTEROL SULFATE (2.5 MG/3ML) 0.083% IN NEBU
2.5000 mg | INHALATION_SOLUTION | Freq: Four times a day (QID) | RESPIRATORY_TRACT | Status: DC | PRN
  Administered 2020-06-23: 2.5 mg via RESPIRATORY_TRACT
  Filled 2020-06-23: qty 3

## 2020-06-23 MED ORDER — DIPHENHYDRAMINE HCL 25 MG PO CAPS
25.0000 mg | ORAL_CAPSULE | Freq: Four times a day (QID) | ORAL | Status: DC | PRN
  Administered 2020-06-23: 25 mg via ORAL
  Filled 2020-06-23: qty 1

## 2020-06-23 MED ORDER — GUAIFENESIN 200 MG PO TABS
200.0000 mg | ORAL_TABLET | ORAL | Status: DC | PRN
  Administered 2020-06-23 – 2020-06-24 (×2): 200 mg via ORAL
  Filled 2020-06-23 (×4): qty 1

## 2020-06-23 MED ORDER — SODIUM CHLORIDE 0.9 % IV SOLN: INTRAVENOUS | Status: DC

## 2020-06-23 MED ORDER — DIPHENHYDRAMINE HCL 25 MG PO CAPS
25.0000 mg | ORAL_CAPSULE | Freq: Once | ORAL | Status: AC | PRN
  Administered 2020-06-23: 25 mg via ORAL
  Filled 2020-06-23: qty 1

## 2020-06-23 NOTE — Progress Notes (Signed)
Per Dr. Radford Pax, plan to place ZIO AT at discharge.  I have changed order and discussed with EKG department.  Please call EKG department at [phone] at day of discharge to place ZIO AT monitor otherwise call card master to arrange.  I have already placed order.  Leanor Kail, PA - C

## 2020-06-23 NOTE — Addendum Note (Signed)
Addended by: Dominga Ferry on: 06/23/2020 11:38 AM   Modules accepted: Orders

## 2020-06-23 NOTE — Consult Note (Signed)
[facility] Renal Consultation Note  Requesting MD:  Indication for Consultation:   HPI:  William Flores is a 70 y.o. male.  With a history of chronic COPD O2 dependent respiratory failure, history of colon cancer status postresection 2002, history of chronic atrial fibrillation on Coumadin, history of stage III/IV chronic kidney disease followed by Dr. Theador Hawthorne in [place].  History of diastolic heart disease.  He presents to the primary care physician status post cardiac arrest and syncope.  This occurred as a witnessed syncopal event.  He is evaluated by cardiology who signed off and consult recommended stress test as an outpatient.  He also has acute blood loss anemia and is awaiting his INR to drop in order to undergo EGD and colonoscopy.  His renal function appears to be relatively stable.  He was taking losartan as an outpatient and this is being held.   Blood pressure 130/85 pulse 87 temperature 98.1 O2 sats 98% 4 L nasal cannula.  Urine output 650 cc recorded 06/22/2020  Home medications: Allopurinol 063 mg daily, Trulicity, insulin, potassium chloride half tablet daily, losartan 25 mg daily, metoprolol 50 mg daily, Protonix 40 mg daily, torsemide 60 mg in the morning 40 mg in the evening, Coumadin, Lipitor 20 mg daily Farxiga 5 mg daily  Sodium 137 potassium 4.4 chloride 103 CO2 23 BUN 37 creatinine 2.27 glucose 126 calcium 9.1 hemoglobin 10.6.  In hospital medications, Lipitor 20 mg daily, iron 325 mg daily, insulin sliding scale, glargine 20 units daily, metoprolol 50 mg daily, Protonix 40 mg daily   Creat  Date/Time Value Ref Range Status  05/25/2012 03:56 PM 1.12 0.50 - 1.35 mg/dL Final   Creatinine, Ser  Date/Time Value Ref Range Status  06/23/2020 01:51 AM 2.27 (H) 0.61 - 1.24 mg/dL Final  06/22/2020 09:34 AM 2.38 (H) 0.61 - 1.24 mg/dL Final  06/21/2020 02:22 AM 2.41 (H) 0.61 - 1.24 mg/dL Final  06/20/2020 02:48 AM 2.23 (H) 0.61 - 1.24 mg/dL Final   06/19/2020 03:50 PM 1.77 (H) 0.61 - 1.24 mg/dL Final  03/19/2020 01:11 PM 1.79 (H) 0.76 - 1.27 mg/dL Final  12/20/2019 04:24 PM 1.69 (H) 0.76 - 1.27 mg/dL Final  07/25/2019 02:01 PM 1.49 (H) 0.76 - 1.27 mg/dL Final  06/13/2019 04:14 PM 1.65 (H) 0.61 - 1.24 mg/dL Final  04/26/2019 09:58 AM 1.54 (H) 0.76 - 1.27 mg/dL Final  04/15/2019 05:53 AM 1.74 (H) 0.61 - 1.24 mg/dL Final  04/14/2019 04:19 AM 1.79 (H) 0.61 - 1.24 mg/dL Final  04/13/2019 04:30 AM 1.65 (H) 0.61 - 1.24 mg/dL Final  04/12/2019 04:40 AM 1.84 (H) 0.61 - 1.24 mg/dL Final  04/11/2019 04:23 AM 2.03 (H) 0.61 - 1.24 mg/dL Final  04/10/2019 04:11 AM 2.26 (H) 0.61 - 1.24 mg/dL Final  04/09/2019 07:55 AM 2.33 (H) 0.61 - 1.24 mg/dL Final  04/08/2019 04:18 AM 2.47 (H) 0.61 - 1.24 mg/dL Final  04/07/2019 05:38 AM 2.25 (H) 0.61 - 1.24 mg/dL Final  04/06/2019 05:52 AM 2.25 (H) 0.61 - 1.24 mg/dL Final  04/05/2019 04:52 AM 2.28 (H) 0.61 - 1.24 mg/dL Final  04/04/2019 04:48 AM 2.26 (H) 0.61 - 1.24 mg/dL Final  04/03/2019 04:35 AM 2.13 (H) 0.61 - 1.24 mg/dL Final  04/02/2019 08:00 AM 2.04 (H) 0.61 - 1.24 mg/dL Final  04/01/2019 08:55 AM 1.93 (H) 0.61 - 1.24 mg/dL Final  03/31/2019 05:30 AM 1.81 (H) 0.61 - 1.24 mg/dL Final  03/30/2019 05:52 AM 1.96 (H) 0.61 - 1.24 mg/dL Final  03/29/2019 04:46 AM 1.67 (H)  0.61 - 1.24 mg/dL Final  03/28/2019 09:12 AM 1.82 (H) 0.61 - 1.24 mg/dL Final  03/08/2019 11:50 AM 1.93 (H) 0.76 - 1.27 mg/dL Final  02/06/2019 02:57 PM 1.58 (H) 0.76 - 1.27 mg/dL Final  01/01/2019 04:08 AM 1.31 (H) 0.61 - 1.24 mg/dL Final  12/31/2018 03:37 AM 1.37 (H) 0.61 - 1.24 mg/dL Final  12/29/2018 03:58 AM 1.40 (H) 0.61 - 1.24 mg/dL Final  12/28/2018 05:24 AM 1.35 (H) 0.61 - 1.24 mg/dL Final  12/27/2018 04:16 AM 1.57 (H) 0.61 - 1.24 mg/dL Final  12/26/2018 03:55 AM 2.20 (H) 0.61 - 1.24 mg/dL Final  12/25/2018 07:02 PM 2.31 (H) 0.61 - 1.24 mg/dL Final  09/03/2018 01:35 PM 1.93 (H) 0.76 - 1.27 mg/dL Final  05/02/2018 09:20 AM  1.47 (H) 0.76 - 1.27 mg/dL Final  12/01/2017 09:41 AM 1.56 (H) 0.76 - 1.27 mg/dL Final  05/01/2017 03:30 PM 1.43 (H) 0.76 - 1.27 mg/dL Final  01/25/2017 09:43 AM 1.20 0.76 - 1.27 mg/dL Final  05/10/2016 01:24 PM 1.59 (H) 0.61 - 1.24 mg/dL Final  04/12/2016 11:47 AM 1.33 (H) 0.76 - 1.27 mg/dL Final  03/07/2016 10:32 AM 1.36 (H) 0.76 - 1.27 mg/dL Final  11/10/2015 12:04 PM 2.05 (H) 0.61 - 1.24 mg/dL Final  10/15/2015 10:40 AM 1.52 (H) 0.76 - 1.27 mg/dL Final  09/17/2015 11:14 AM 1.52 (H) 0.76 - 1.27 mg/dL Final  07/20/2015 10:09 AM 1.33 (H) 0.76 - 1.27 mg/dL Final  07/13/2015 10:42 AM 1.50 (H) 0.76 - 1.27 mg/dL Final  05/28/2015 10:02 AM 1.21 0.76 - 1.27 mg/dL Final     PMHx:   Past Medical History:  Diagnosis Date   CAD (coronary artery disease)    LHC 2/08:  mRCA 50%, o/w no sig CAD, EF 25%   Cardiomyopathy ([facility])    EF 35-40% in 2/16 in setting of AF with RVR >> echo 5/16 with improved LVF with EF 65-70%   Chronic atrial fibrillation (HCC)    Chronic diastolic heart failure (HCC)    HFPF - EF ~65-70% (OFTEN EXACERBATED BY AFIB)   CKD (chronic kidney disease)    hx of worsening renal failure and hyperK+ in setting of acute diast CHF >> required CVVHD in 4/16 and dialysis x 1 in 5/16   CKD stage 3 due to type 2 diabetes mellitus ([place]) 09/17/2015   Colon cancer ([place])    Colon polyps 09/21/2012   Tubular adenoma   Diabetes ([place])    Hyperlipidemia    Hypertension    Obesity hypoventilation syndrome ([place])    Renal insufficiency    Sleep apnea     Past Surgical History:  Procedure Laterality Date   CIRCUMCISION     COLON RESECTION     COLONOSCOPY N/A 09/21/2012   Procedure: COLONOSCOPY;  Surgeon: Inda Castle, MD;  Location: [facility];  Service: Endoscopy;  Laterality: N/A;   COLONOSCOPY N/A 12/28/2018   Procedure: COLONOSCOPY;  Surgeon: Rogene Houston, MD;  [facility];  Service: Endoscopy;  Laterality: N/A;   ESOPHAGOGASTRODUODENOSCOPY N/A 12/28/2018    Procedure: ESOPHAGOGASTRODUODENOSCOPY (EGD);  Surgeon: Rogene Houston, MD;  Location: [facility];  Service: Endoscopy;  Laterality: N/A;   RIGHT HEART CATH N/A 04/11/2019   Procedure: RIGHT HEART CATH;  Surgeon: Larey Dresser, MD;  Location: [facility];  Service: Cardiovascular;  Laterality: N/A;   US ECHOCARDIOGRAPHY  03/04/2010   abnormal study    Family Hx:  Family History  Problem Relation Age of Onset  Breast cancer Mother    Diabetes Mother    Heart attack Father     Social History:  reports that he quit smoking about 36 years ago. He has never used smokeless tobacco. He reports that he does not drink alcohol and does not use drugs.  Allergies:  Allergies  Allergen Reactions   Codeine     Headache     Medications: Prior to Admission medications   Medication Sig Start Date End Date Taking? Authorizing Provider  acetaminophen (TYLENOL) 325 MG tablet Take 2 tablets (650 mg total) by mouth every 6 (six) hours as needed for mild pain (or Fever >/= 101). 01/01/19  Yes Emokpae, Courage, MD  allopurinol (ZYLOPRIM) 300 MG tablet Take 1 tablet (300 mg total) by mouth daily. 12/20/19  Yes [name], [name], MD  benzonatate (TESSALON) 100 MG capsule TAKE 1 CAPSULE BY MOUTH TWICE A DAY Patient taking differently: Take 100 mg by mouth 2 (two) times daily as needed for cough. 12/31/19  Yes [name], [name], MD  Dulaglutide (TRULICITY) 1.5 BM/2.1JD SOPN Inject 1.5 mg into the skin once a week. Patient taking differently: Inject 1.5 mg into the skin every Saturday. 12/20/19  Yes [name], [name], MD  ferrous sulfate 325 (65 FE) MG tablet Take 325 mg by mouth daily with breakfast.    Yes [provider]  insulin aspart (NOVOLOG FLEXPEN) 100 UNIT/ML FlexPen insulin aspart (novoLOG) injection 0-9 Units 0-9 Units, Subcutaneous, 3 times daily with meals, CBG < 70: Implement Hypoglycemia Standing Orders and refer to Hypoglycemia Standing Orders sidebar report CBG 70 -  120: 0 units CBG 121 - 150: 1 unit CBG 151 - 200: 2 units CBG 201 - 250: 3 units CBG 251 - 300: 5 units CBG 301 - 350: 7 units CBG 351 - 400: 9 units CBG > 400 Patient taking differently: Inject 0-9 Units into the skin See admin instructions. Inject  0-9 Units subcutaneously  3 times daily with meals, per sliding scale: CBG < 70: Implement Hypoglycemia Standing Orders and refer to Hypoglycemia Standing Orders sidebar report CBG 70 - 120: 0 units CBG 121 - 150: 1 unit CBG 151 - 200: 2 units CBG 201 - 250: 3 units CBG 251 - 300: 5 units CBG 301 - 350: 7 units CBG 351 - 400: 9 units CBG > 400 12/20/19  Yes [name], [name], MD  insulin degludec (TRESIBA FLEXTOUCH) 100 UNIT/ML FlexTouch Pen Inject 24 Units into the skin at bedtime. 03/19/20  Yes [name], [name], MD  KLOR-CON M20 20 MEQ tablet TAKE 1/2 TABLET (10 MEQ TOTAL) BY MOUTH DAILY. NEED OFFICE VISIT Patient taking differently: Take 10 mEq by mouth every morning. 06/02/20  Yes [name], [name], MD  losartan (COZAAR) 25 MG tablet TAKE 1 TABLET BY MOUTH EVERY DAY Patient taking differently: Take 25 mg by mouth daily. 05/07/20  Yes Larey Dresser, MD  metoprolol succinate (TOPROL-XL) 50 MG 24 hr tablet TAKE 1 TABLET (50 MG TOTAL) BY MOUTH DAILY. TAKE WITH OR IMMEDIATELY FOLLOWING A MEAL. Patient taking differently: Take 50 mg by mouth daily. 05/25/20  Yes Larey Dresser, MD  pantoprazole (PROTONIX) 40 MG tablet TAKE 1 TABLET BY MOUTH EVERY DAY Patient taking differently: Take 40 mg by mouth daily. 06/02/20  Yes [name], [name], MD  polyethylene glycol (MIRALAX / GLYCOLAX) 17 g packet Take 17 g by mouth daily. Patient taking differently: Take 17 g by mouth daily as needed (constipation). 01/01/19  Yes Roxan Hockey, MD  torsemide (DEMADEX) 20 MG tablet Take 3 tablets (60 mg total) by mouth in the morning AND 2 tablets (40 mg total) every evening. 05/25/20  Yes Larey Dresser, MD  warfarin (COUMADIN) 5 MG tablet Take 1 tablet (5 mg total)  by mouth every evening. Patient taking differently: Take 5-7.5 mg by mouth See admin instructions. Take 1 1/2 tablets (7.5 mg) by mouth on Monday evening, take 1 tablet (5 mg) on all other nights of the week 12/20/19  Yes [name], [name], MD  atorvastatin (LIPITOR) 20 MG tablet TAKE 1 TABLET BY MOUTH EVERY DAY Patient not taking: No sig reported 03/19/20   [name], [name], MD  FARXIGA 5 MG TABS tablet Take 5 mg by mouth daily. 01/26/19   [provider]  glucose blood (CONTOUR NEXT TEST) test strip Test BS TID Dx E11.29 05/22/20   [name], [name], MD  Insulin Pen Needle 32G X 4 MM MISC Use to give insulin daily Dx E11.29 04/15/19   Charlynne Cousins, MD  sildenafil (REVATIO) 20 MG tablet Take 1 tablet (20 mg total) by mouth 3 (three) times daily. 06/13/19   Larey Dresser, MD  diltiazem [facility]) 240 MG 24 hr capsule TAKE 1 CAPSULE DAILY 10/19/12 02/17/13  Vernie Shanks, MD     Labs:  Results for orders placed or performed during the hospital encounter of 06/19/20 (from the past 48 hour(s))  Glucose, capillary     Status: Abnormal   Collection Time: 06/21/20  4:07 PM  Result Value Ref Range   Glucose-Capillary 149 (H) 70 - 99 mg/dL    Comment: Glucose reference range applies only to samples taken after fasting for at least 8 hours.  Glucose, capillary     Status: Abnormal   Collection Time: 06/21/20 10:15 PM  Result Value Ref Range   Glucose-Capillary 176 (H) 70 - 99 mg/dL    Comment: Glucose reference range applies only to samples taken after fasting for at least 8 hours.  Glucose, capillary     Status: Abnormal   Collection Time: 06/22/20  7:43 AM  Result Value Ref Range   Glucose-Capillary 138 (H) 70 - 99 mg/dL    Comment: Glucose reference range applies only to samples taken after fasting for at least 8 hours.  CBC with Differential/Platelet     Status: Abnormal   Collection Time: 06/22/20  9:34 AM  Result Value Ref Range   WBC 6.0 4.0 - 10.5 K/uL   RBC 3.75  (L) 4.22 - 5.81 MIL/uL   Hemoglobin 10.7 (L) 13.0 - 17.0 g/dL   HCT 36.1 (L) 39.0 - 52.0 %   MCV 96.3 80.0 - 100.0 fL   MCH 28.5 26.0 - 34.0 pg   MCHC 29.6 (L) 30.0 - 36.0 g/dL   RDW 16.9 (H) 11.5 - 15.5 %   Platelets 126 (L) 150 - 400 K/uL   nRBC 0.0 0.0 - 0.2 %   Neutrophils Relative % 72 %   Neutro Abs 4.4 1.7 - 7.7 K/uL   Lymphocytes Relative 8 %   Lymphs Abs 0.5 (L) 0.7 - 4.0 K/uL   Monocytes Relative 13 %   Monocytes Absolute 0.8 0.1 - 1.0 K/uL   Eosinophils Relative 4 %   Eosinophils Absolute 0.2 0.0 - 0.5 K/uL   Basophils Relative 2 %   Basophils Absolute 0.1 0.0 - 0.1 K/uL   Immature Granulocytes 1 %   Abs Immature Granulocytes 0.03 0.00 - 0.07 K/uL    Comment: Performed  at [facility], [place] [address] [place], [place] [address]  Basic metabolic panel     Status: Abnormal   Collection Time: 06/22/20  9:34 AM  Result Value Ref Range   Sodium 137 135 - 145 mmol/L   Potassium 4.6 3.5 - 5.1 mmol/L   Chloride 107 98 - 111 mmol/L   CO2 25 22 - 32 mmol/L   Glucose, Bld 123 (H) 70 - 99 mg/dL    Comment: Glucose reference range applies only to samples taken after fasting for at least 8 hours.   BUN 56 (H) 8 - 23 mg/dL   Creatinine, Ser 2.38 (H) 0.61 - 1.24 mg/dL   Calcium 9.0 8.9 - 10.3 mg/dL   GFR, Estimated 29 (L) >60 mL/min    Comment: (NOTE) Calculated using the CKD-EPI Creatinine Equation (2021)    Anion gap 5 5 - 15    Comment: Performed at [facility] [address] [place], [place] [address]  Protime-INR     Status: Abnormal   Collection Time: 06/22/20  9:34 AM  Result Value Ref Range   Prothrombin Time 27.0 (H) 11.4 - 15.2 seconds   INR 2.5 (H) 0.8 - 1.2    Comment: (NOTE) INR goal varies based on device and disease states. Performed at [facility], [place] [address] [place], [place] [address]   Hemoglobin A1c     Status: Abnormal   Collection Time: 06/22/20  9:34 AM  Result Value Ref Range   Hgb A1c MFr Bld 8.2 (H) 4.8 - 5.6 %     Comment: (NOTE)         Prediabetes: 5.7 - 6.4         Diabetes: >6.4         Glycemic control for adults with diabetes: <7.0    Mean Plasma Glucose 189 mg/dL    Comment: (NOTE) Performed At: [facility] [place], [place] [id number] Rush Farmer MD [id number]   Glucose, capillary     Status: Abnormal   Collection Time: 06/22/20 11:39 AM  Result Value Ref Range   Glucose-Capillary 114 (H) 70 - 99 mg/dL    Comment: Glucose reference range applies only to samples taken after fasting for at least 8 hours.  Glucose, capillary     Status: None   Collection Time: 06/22/20  4:13 PM  Result Value Ref Range   Glucose-Capillary 94 70 - 99 mg/dL    Comment: Glucose reference range applies only to samples taken after fasting for at least 8 hours.  Glucose, capillary     Status: Abnormal   Collection Time: 06/22/20  8:54 PM  Result Value Ref Range   Glucose-Capillary 171 (H) 70 - 99 mg/dL    Comment: Glucose reference range applies only to samples taken after fasting for at least 8 hours.  Glucose, capillary     Status: Abnormal   Collection Time: 06/22/20  9:08 PM  Result Value Ref Range   Glucose-Capillary 156 (H) 70 - 99 mg/dL    Comment: Glucose reference range applies only to samples taken after fasting for at least 8 hours.  CBC with Differential/Platelet     Status: Abnormal   Collection Time: 06/23/20  1:51 AM  Result Value Ref Range   WBC 6.6 4.0 - 10.5 K/uL   RBC 3.70 (L) 4.22 - 5.81 MIL/uL   Hemoglobin 10.6 (L) 13.0 - 17.0 g/dL   HCT 35.8 (L) 39.0 - 52.0 %  MCV 96.8 80.0 - 100.0 fL   MCH 28.6 26.0 - 34.0 pg   MCHC 29.6 (L) 30.0 - 36.0 g/dL   RDW 16.8 (H) 11.5 - 15.5 %   Platelets 120 (L) 150 - 400 K/uL   nRBC 0.0 0.0 - 0.2 %   Neutrophils Relative % 74 %   Neutro Abs 4.8 1.7 - 7.7 K/uL   Lymphocytes Relative 7 %   Lymphs Abs 0.4 (L) 0.7 - 4.0 K/uL   Monocytes Relative 13 %   Monocytes Absolute 0.9 0.1 - 1.0 K/uL   Eosinophils Relative 4 %    Eosinophils Absolute 0.3 0.0 - 0.5 K/uL   Basophils Relative 1 %   Basophils Absolute 0.1 0.0 - 0.1 K/uL   Immature Granulocytes 1 %   Abs Immature Granulocytes 0.05 0.00 - 0.07 K/uL    Comment: Performed at [facility] [address] [place], [place] [address]  Protime-INR     Status: Abnormal   Collection Time: 06/23/20  1:51 AM  Result Value Ref Range   Prothrombin Time 28.1 (H) 11.4 - 15.2 seconds   INR 2.6 (H) 0.8 - 1.2    Comment: (NOTE) INR goal varies based on device and disease states. Performed at [facility], [place] [address] [place], [place] [address]   Basic metabolic panel     Status: Abnormal   Collection Time: 06/23/20  1:51 AM  Result Value Ref Range   Sodium 137 135 - 145 mmol/L   Potassium 4.4 3.5 - 5.1 mmol/L   Chloride 103 98 - 111 mmol/L   CO2 22 22 - 32 mmol/L   Glucose, Bld 126 (H) 70 - 99 mg/dL    Comment: Glucose reference range applies only to samples taken after fasting for at least 8 hours.   BUN 57 (H) 8 - 23 mg/dL   Creatinine, Ser 2.27 (H) 0.61 - 1.24 mg/dL   Calcium 9.1 8.9 - 10.3 mg/dL   GFR, Estimated 31 (L) >60 mL/min    Comment: (NOTE) Calculated using the CKD-EPI Creatinine Equation (2021)    Anion gap 12 5 - 15    Comment: Performed at [facility] [address] [place], [place] [address]  Glucose, capillary     Status: Abnormal   Collection Time: 06/23/20  7:28 AM  Result Value Ref Range   Glucose-Capillary 107 (H) 70 - 99 mg/dL    Comment: Glucose reference range applies only to samples taken after fasting for at least 8 hours.  Glucose, capillary     Status: Abnormal   Collection Time: 06/23/20  8:04 AM  Result Value Ref Range   Glucose-Capillary 135 (H) 70 - 99 mg/dL    Comment: Glucose reference range applies only to samples taken after fasting for at least 8 hours.  Glucose, capillary     Status: Abnormal   Collection Time: 06/23/20 11:20 AM  Result Value Ref Range   Glucose-Capillary 177 (H) 70 - 99  mg/dL    Comment: Glucose reference range applies only to samples taken after fasting for at least 8 hours.     ROS:  General   denies fatigue fever sweats chills Eyes: Denies visual complaints ENT: Denies hearing loss epistaxis or sore throat uses oxygen through nasal cannula Cardiovascular: History of atrial fibrillation anticoagulated rate controlled Respiratory: Oxygen dependent COPD Abdominal: No abdominal pain nausea vomiting diarrhea Urogenital: No dysuria urgency or frequency Endocrine: History of diabetes no thyroid disease Neurologic: No stroke  seizures no tingling in upper or lower extremities no headache Musculoskeletal: No use of nonsteroidal inflammatory drugs no joint pain or joint swelling  Physical Exam: Vitals:   06/23/20 0436 06/23/20 0715  BP: 129/84 (!) 154/83  Pulse: 98 (!) 101  Resp: 19 18  Temp: 97.8 F (36.6 C) 98.1 F (36.7 C)  SpO2: 96% 97%     General: Alert unshaven gentleman nondistressed HEENT: Mucous membranes moist normocephalic atraumatic Eyes: Pupils round equal reactive extraocular movements intact Neck: Neck was thick difficult to assess JVP.  No thyromegaly appreciated no adenopathy appreciated Heart: Regular rate and rhythm with faint systolic murmur 2/6 systolic ejection Lungs: Clear to auscultation some occasional rhonchorous breath sounds Abdomen: Soft nontender obese Extremities: No cyanosis clubbing or edema does have some mottling and hemostasis changes Skin: No ulcers lesions Neuro: Grossly intact  Assessment/Plan: Chronic renal disease stage 3/4 Baseline creatinine appears to be about 2 to 2.5 mg/dL.  History of diabetes.  Patient already has a nephrologist.  He can follow-up with his regular nephrologist Dr. Theador Hawthorne.  Renal ultrasound showed no evidence of hydronephrosis.  Urinalysis is bland.  No M spike observed 04/09/2019.   Volume/hypertension agree with current plan to hold ARB.  I think it is reasonable to hold [place].  He  does not appear particularly volume overloaded.  He is not taking fairly high-dose diuretics at home.  His last 2D echo showed normal ejection fraction 06/21/2020.  It may be reasonable to reintroduce a lower dose of torsemide such as 40 mg daily. Syncopal spell outpatient work-up with cardiology.  No malignant cardiac arrhythmias identified Anemia appears to be stable at this point. Diabetes as per primary service Atrial fibrillation as per primary service GI bleed awaiting EGD colonoscopy history of colon cancer    Sherril Croon 06/23/2020, 12:10 PM

## 2020-06-23 NOTE — TOC Initial Note (Signed)
Transition of Care [facility]) - Initial/Assessment Note    Patient Details  Name: William Flores MRN: [id number] Date of Birth: 01-06-1950  [facility]) CM/SW Contact:    Trula Ore, [place] Phone Number: 06/23/2020, 2:16 PM  Clinical Narrative:                   CSW spoke with patient at bedside regarding PT recommendation of SNF. Patient is agreeable to CSW faxing him out for SNF placement near his home address. Patient would like CSW to follow up with him closer to being medically ready to follow up on DC plan in case he changes his mind about SNF placement. Patient comes from home alone. Patient has received both covid vaccines as well as booster. CSW will continue to follow and assist with discharge planning needs.  Expected Discharge Plan: Skilled Nursing Facility Barriers to Discharge: Continued Medical Work up   Patient Goals and CMS Choice Patient states their goals for this hospitalization and ongoing recovery are:: to go to SNF CMS Medicare.gov Compare Post Acute Care list provided to:: Patient Choice offered to / list presented to : Patient  Expected Discharge Plan and Services Expected Discharge Plan: [place] In-house Referral: Clinical Social Work     Living arrangements for the past 2 months: Single Family Home                                      Prior Living Arrangements/Services Living arrangements for the past 2 months: Single Family Home Lives with:: Self Patient language and need for interpreter reviewed:: Yes Do you feel safe going back to the place where you live?: No   SNF  Need for Family Participation in Patient Care: Yes (Comment) Care giver support system in place?: Yes (comment)   Criminal Activity/Legal Involvement Pertinent to Current Situation/Hospitalization: No - Comment as needed  Activities of Daily Living Home Assistive Devices/Equipment: None ADL Screening (condition at time of  admission) Patient's cognitive ability adequate to safely complete daily activities?: Yes Is the patient deaf or have difficulty hearing?: No Does the patient have difficulty seeing, even when wearing glasses/contacts?: No Does the patient have difficulty concentrating, remembering, or making decisions?: No Patient able to express need for assistance with ADLs?: Yes Does the patient have difficulty dressing or bathing?: No Independently performs ADLs?: Yes (appropriate for developmental age) Does the patient have difficulty walking or climbing stairs?: Yes Weakness of Legs: Both Weakness of Arms/Hands: None  Permission Sought/Granted Permission sought to share information with : Case Manager, Family Supports, Chartered certified accountant granted to share information with : Yes, Verbal Permission Granted  Share Information with NAME: [name]  Permission granted to share info w AGENCY: SNF  Permission granted to share info w Relationship: daughter  Permission granted to share info w Contact Information: [name] [phone]  Emotional Assessment Appearance:: Appears stated age Attitude/Demeanor/Rapport: Gracious Affect (typically observed): Calm Orientation: : Oriented to Self, Oriented to Place, Oriented to  Time, Oriented to Situation Alcohol / Substance Use: Not Applicable Psych Involvement: No (comment)  Admission diagnosis:  Hypocalcemia [E83.51] Syncope and collapse [R55] Hypokalemia [E87.6] Syncope [R55] Fecal occult blood test positive [R19.5] Supratherapeutic INR [R79.1] Symptomatic anemia [D64.9] Acute on chronic congestive heart failure, unspecified heart failure type [facility]) [I50.9] Patient Active Problem List   Diagnosis Date Noted   AKI (acute kidney injury) ([place])  Syncope and collapse    Supratherapeutic INR    Syncope 06/19/2020   Hypokalemia 06/19/2020   Fecal occult blood test positive 06/19/2020   Cor pulmonale, acute ([place]) 04/15/2019   Bilateral  leg edema    Chronic renal failure    Pulmonary hypertension ([place]) 12/28/2018   Hyperlipidemia associated with type 2 diabetes mellitus ([place]) 05/18/2017   Morbid obesity with BMI of 45.0-49.9, adult ([place]) 05/18/2017   CKD stage 3 due to type 2 diabetes mellitus ([place]) 09/17/2015   MGUS (monoclonal gammopathy of unknown significance) 05/27/2015   Normocytic anemia 11/04/2014   Peripheral edema    Essential hypertension    DM (diabetes mellitus), type 2, uncontrolled, with renal complications ([place]) 49/70/2637   Dysphagia 04/20/2014   Atrial fibrillation with slow ventricular response ([place]) 04/14/2014   Anticoagulated on Coumadin 03/20/2014   Nonischemic cardiomyopathy ([place]) 02/20/2014   OSA (obstructive sleep apnea)    Right heart failure ([place]) 02/10/2014   Morbid obesity ([place]) 08/05/2013   History of colon cancer    Gout 05/25/2012   Hypertensive heart disease    PCP:  [name], [name], MD Pharmacy:   [id number] [place]Phone: [phone]: [phone]    Social Determinants of Health (SDOH) Interventions    Readmission Risk Interventions Readmission Risk Prevention Plan 04/15/2019  Transportation Screening Complete  PCP or Specialist Appt within 3-5 Days Complete  HRI or [place]  Social Work Consult for [place]anning/Counseling Complete  Palliative Care Screening Not Applicable  Medication Review (Press photographer Complete  Some recent data might be hidden

## 2020-06-23 NOTE — Progress Notes (Signed)
Physical Therapy Treatment Patient Details Name: William Flores MRN: [id number] DOB: 30-Jun-1950 Today's Date: 06/23/2020    History of Present Illness Pt is a 70 y.o. male admitted 06/19/20 after syncopal episode vs. cardiac arrest in lobby of MD office; code was called, ROSC achieved in the office, no shockable rhythm detected. Transfer to [facility] with c/o chest pain. CXR with L pleural effusion. Echo shows worsening RV failure, pulmonary HTN. Pt also with acute on chronic (unexplained) anemia, suspect due to GIB with +FOBT in setting of supratherapeutic INR. Plan for enteroscopy 6/22. PMH includes CHF, DM, CAD, afib, OSA, colon CA.   PT Comments    Pt slowly progressing with mobility. Pt tolerated short bout of ambulation distance with RW and min guard for balance. Pt limited by pain, decreased activity tolerance and generalized weakness. Discussed potential need for short-term SNF-level therapies to maximize functional mobility and independence prior to return home; pt willing to consider as he will need to be functioning at mod indep-level to return home alone (SW notified). Will continue to follow acutely to address established goals.    Follow Up Recommendations  SNF;Supervision - Intermittent     Equipment Recommendations  None recommended by PT    Recommendations for Other Services       Precautions / Restrictions Precautions Precautions: Fall;Other (comment) Precaution Comments: Sternal precautions for comfort (rib pain post-CPR) Restrictions Weight Bearing Restrictions: No    Mobility  Bed Mobility               General bed mobility comments: Received sitting on edge of bed    Transfers Overall transfer level: Needs assistance Equipment used: Rolling walker (2 wheeled) Transfers: Sit to/from Stand Sit to Stand: Supervision         General transfer comment: Increased time and effort, supervision for safety  Ambulation/Gait Ambulation/Gait assistance: Min  guard Gait Distance (Feet): 32 Feet Assistive device: Rolling walker (2 wheeled) Gait Pattern/deviations: Step-through pattern;Decreased stride length;Antalgic Gait velocity: Decreased   General Gait Details: Slow, labored gait with RW and min guard for balance; pt declined further distance secondary to rib pain despite max encouragement   Stairs             Wheelchair Mobility    Modified Rankin (Stroke Patients Only)       Balance Overall balance assessment: Needs assistance Sitting-balance support: No upper extremity supported;Feet supported Sitting balance-Leahy Scale: Fair       Standing balance-Leahy Scale: Fair Standing balance comment: Can static stand without UE support; static and dynamic stability improved with RW                            Cognition Arousal/Alertness: Awake/alert Behavior During Therapy: Flat affect Overall Cognitive Status: Within Functional Limits for tasks assessed                                 General Comments: self-limiting, requires max encouragement and educ on need to mobilize despite pain      Exercises Other Exercises Other Exercises: Incentive spirometer provided - pt pulling </ 500 mL    General Comments General comments (skin integrity, edema, etc.): SpO2 90s-100s, SpO2 >/90% on 2L O2 [place]. Increased time discussing importance of mobility as well as safe d/c planning; pt able to do very little with PT, therefore unsure safe to return home alone, pt willing to consider  SNF for post-acute rehab      Pertinent Vitals/Pain Pain Assessment: Faces Faces Pain Scale: Hurts even more Pain Location: Ribs Pain Descriptors / Indicators: Sore;Grimacing;Guarding Pain Intervention(s): Monitored during session;Limited activity within patient's tolerance    Home Living                      Prior Function            PT Goals (current goals can now be found in the care plan section) Progress  towards PT goals: Progressing toward goals    Frequency    Min 3X/week      PT Plan Discharge plan needs to be updated    Co-evaluation              AM-PAC PT "6 Clicks" Mobility   Outcome Measure  Help needed turning from your back to your side while in a flat bed without using bedrails?: A Little Help needed moving from lying on your back to sitting on the side of a flat bed without using bedrails?: A Lot Help needed moving to and from a bed to a chair (including a wheelchair)?: A Little Help needed standing up from a chair using your arms (e.g., wheelchair or bedside chair)?: A Little Help needed to walk in hospital room?: A Little Help needed climbing 3-5 steps with a railing? : A Little 6 Click Score: 17    End of Session Equipment Utilized During Treatment: Gait belt;Oxygen Activity Tolerance: Patient limited by pain Patient left: in bed;with call bell/phone within reach;with bed alarm set (sitting EOB, declined recliner) Nurse Communication: Mobility status PT Visit Diagnosis: Other abnormalities of gait and mobility (R26.89);Pain     Time: 4585-9292 PT Time Calculation (min) (ACUTE ONLY): 22 min  Charges:  $Therapeutic Activity: 8-22 mins                     Mabeline Caras, PT, DPT Acute Rehabilitation Services  Pager [phone] Office [place] 06/23/2020, 11:31 AM

## 2020-06-23 NOTE — H&P (View-Only) (Signed)
   Patient Name: William Flores Date of Encounter: 06/23/2020, 7:46 AM    Subjective  Chest remains very sore He was on the schedule for an EGD and a colonoscopy today, I think scheduled by my partners last week but he has not had a colonoscopy prep and his INR is 2.6   Objective  BP (!) 154/83   Pulse (!) 101   Temp 98.1 F (36.7 C) (Oral)   Resp 18   Ht 5' 3"  (1.6 m)   Wt 122.5 kg   SpO2 97%   BMI 47.83 kg/m  Marked chest wall soreness status post CPR  CBC Latest Ref Rng & Units 06/23/2020 06/22/2020 06/21/2020  WBC 4.0 - 10.5 K/uL 6.6 6.0 6.6  Hemoglobin 13.0 - 17.0 g/dL 10.6(L) 10.7(L) 11.1(L)  Hematocrit 39.0 - 52.0 % 35.8(L) 36.1(L) 36.6(L)  Platelets 150 - 400 K/uL 120(L) 126(L) 105(L)    Lab Results  Component Value Date   INR 2.6 (H) 06/23/2020   INR 2.5 (H) 06/22/2020   INR 2.7 (H) 06/21/2020      Assessment and Plan  Heme positive anemia with a ferritin of 48 and iron saturation of 9% with high TIBC so iron deficiency anemia Anticoagulated INR currently higher than desirable for endoscopic procedures History of numerous colon polyps and negative EGD for iron deficiency anemia in 2020  VQ scan pending not yet done  Numerous comorbidities   He is not ready for procedures  I am changing the paradigm here I do not think he will tolerate a colonoscopy prep well and I think the yield on that given that it was done 18 months ago is very low with respect to her anemia.  Assuming his VQ scan is okay we will anticipate a push enteroscopy tomorrow  He is so sore he may not tolerate the box that a patient where is when they have a capsule endoscopy so that might have to wait we are looking into alternatives.  It does have to remain on the chest area because of the electronic communication that needs to take place between the capsule and the sensor.  Gatha Mayer, MD, [facility] 06/23/2020 7:50 AM

## 2020-06-23 NOTE — Progress Notes (Signed)
PROGRESS NOTE  William Flores  WER:[id number] DOB: 1950/04/06 DOA: 06/19/2020 PCP: [name], [name], MD   Brief Narrative: William Flores is a 70 y.o. male with a history of chronic 2L O2-dependent respiratory failure, IDA on iron supplement, colon CA s/p resection 2002, chronic atrial fibrillation on coumadin, CKD stage III, T2DM, HTN, HLD, OSA, chronic HFpEF who presented to the ED 6/17 from his PCP's office after an episode of syncope vs. cardiac arrest. Code was called, ROSC achieved in the office, no shockable rhythm detected. He was transported to [facility] where he complained of chest pain over the sternum. SCr 1.77, BUN 39, INR 3.6, hgb 9g/dl (baseline ~12), +FOBT, platelets 86k, ferritin 48, iron 36, WBC normal.   Assessment & Plan: Active Problems:   Hypertensive heart disease   History of colon cancer   Morbid obesity ([place])   Right heart failure (HCC)   OSA (obstructive sleep apnea)   Nonischemic cardiomyopathy (HCC)   Anticoagulated on Coumadin   Atrial fibrillation with slow ventricular response (H[facility])   DM (diabetes mellitus), type 2, uncontrolled, with renal complications (H[facility])   Normocytic anemia   CKD stage 3 due to type 2 diabetes mellitus ([place])   Hyperlipidemia associated with type 2 diabetes mellitus ([place])   Pulmonary hypertension (H[facility])   Syncope   Hypokalemia   Fecal occult blood test positive   AKI (acute kidney injury) ([place])   Syncope and collapse   Supratherapeutic INR  Syncope vs. cardiac arrest: Echo shows worsening RV failure, pulmonary HTN, but no regional WMA and low-normal preserved LVEF. Mild troponin elevation due to demand ischemia.  - Cardiology consulted, planning nuclear stress testing as outpatient as well as 30-day event monitor. - Cardiology signed off, recommending to continue metoprolol, statin.   Acute blood loss anemia on chronic (unexplained) iron deficiency anemia: Suspect to be due to GI bleeding with +FOBT in setting of supratherapeutic INR.   - GI now feels colonoscopy not required and VCE may not be tolerated due to box on chest. Plan is for enteroscopy tomorrow, though INR remains elevated (previously recommendation for INR to be <2 for procedure). I will defer to GI regarding their need for vitamin K, etc.   - Empiric PPI - Consider IV iron. Continue po iron.  Chronic atrial fibrillation, PVCs:  - Holding coumadin. INR down to 2.6.   Pulmonary HTN, RV failure, chronic HFpEF and history of chronic combined HFrEF (tachycardia-mediated and improved on recent echo):  - Cardiology restarted sildenafil. Will need to monitor volume status closely and restart demadex soon. Weight down from admission. - Check perfusion scan given worsening RV function/pulm HTN.   AKI on stage IIIa CKD: Hx of requiring CVVHD for AKI.  - Hold losartan, avoid hypotension. Renal US consistent with chronic disease, bilateral parenchymal thinning, no hydro. SCr above baseline, FENa 0.3%, so gave limited IVF.  stop these and await nephrology recommendations regarding diuretic reinitiation.  T2DM: HbA1c 8.2% - Continue SSI, at inpatient goal.  Hypokalemia: Supplemented  HLD:  - Continue statin  Thrombocytopenia: Chronic  Chronic hypoxic respiratory failure, OSA: At baseline. - Perfusion scan as above - Continue supplemental oxygen, etc.   Obesity: Estimated body mass index is 47.83 kg/m as calculated from the following:   Height as of this encounter: 5' 3"  (1.6 m).   Weight as of this encounter: 122.5 kg.  DVT prophylaxis: SCDs Code Status: Full Family Communication: None at bedside Disposition Plan:  Status is: Inpatient  Remains inpatient appropriate because:Ongoing diagnostic  testing needed not appropriate for outpatient work up  Dispo: The patient is from: Home              Anticipated d/c is to: Home              Patient currently is not medically stable to d/c.   Difficult to place patient No  Consultants:   Cardiology GI  Procedures:  EGD/push enteroscopy planned 6/22.  Antimicrobials: None   Subjective: Taken down for procedure this morning though was not ready. He denies any new complaints, specifically shortness of breath or palpitations. His chest soreness is 10/10, constant, nonradiating.  Objective: Vitals:   06/22/20 1618 06/22/20 2004 06/23/20 0436 06/23/20 0715  BP: 126/84 (!) 111/92 129/84 (!) 154/83  Pulse: 96 100 98 (!) 101  Resp: 18 20 19 18   Temp: (!) 97.4 F (36.3 C) 98.7 F (37.1 C) 97.8 F (36.6 C) 98.1 F (36.7 C)  TempSrc: Oral Oral Oral Oral  SpO2: 99% 96% 96% 97%  Weight:   124.5 kg 122.5 kg  Height:    5' 3"  (1.6 m)    Intake/Output Summary (Last 24 hours) at 06/23/2020 1244 Last data filed at 06/23/2020 1156 Gross per 24 hour  Intake 72 ml  Output 575 ml  Net -503 ml   Filed Weights   06/22/20 0442 06/23/20 0436 06/23/20 0715  Weight: 124 kg 124.5 kg 122.5 kg   Gen: 70 y.o. male in no distress, chronically ill-appearing Pulm: Nonlabored breathing supplemental oxygen, diminished with bibasilar crackles. Pulling very little volume on IS.  CV: Irreg irreg, no murmur, rub, or gallop. No JVD, no pitting dependent edema. GI: Abdomen soft, non-tender, non-distended, with normoactive bowel sounds.  Ext: Warm, no deformities Skin: No new rashes, lesions or ulcers on visualized skin. Neuro: Alert and oriented. No focal neurological deficits. Psych: Judgement and insight appear fair. Mood euthymic & affect congruent. Behavior is appropriate.    Data Reviewed: I have personally reviewed following labs and imaging studies  CBC: Recent Labs  Lab 06/19/20 1550 06/20/20 0248 06/21/20 0222 06/22/20 0934 06/23/20 0151  WBC 5.3 5.7 6.6 6.0 6.6  NEUTROABS 4.4  --  4.9 4.4 4.8  HGB 9.0* 10.3* 11.1* 10.7* 10.6*  HCT 30.6* 34.8* 36.6* 36.1* 35.8*  MCV 97.1 96.7 96.1 96.3 96.8  PLT 86* 121* 105* 126* 440*   Basic Metabolic Panel: Recent Labs  Lab  06/19/20 1550 06/19/20 2316 06/20/20 0248 06/21/20 0222 06/22/20 0934 06/23/20 0151  NA 141  --  140 139 137 137  K 3.1*  --  4.3 4.8 4.6 4.4  CL 113*  --  108 106 107 103  CO2 20*  --  22 25 25 22   GLUCOSE 153*  --  166* 153* 123* 126*  BUN 39*  --  49* 54* 56* 57*  CREATININE 1.77*  --  2.23* 2.41* 2.38* 2.27*  CALCIUM 7.0*  --  9.2 9.1 9.0 9.1  MG  --  2.1  --   --   --   --    GFR: Estimated Creatinine Clearance: 36.6 mL/min (A) (by C-G formula based on SCr of 2.27 mg/dL (H)). Liver Function Tests: Recent Labs  Lab 06/19/20 1550 06/21/20 0222  AST 13* 16  ALT 11 10  ALKPHOS 49 63  BILITOT 0.7 0.9  PROT 4.7* 6.6  ALBUMIN 2.4* 3.2*   No results for input(s): LIPASE, AMYLASE in the last 168 hours. No results for input(s): AMMONIA in the last 168  hours. Coagulation Profile: Recent Labs  Lab 06/19/20 1558 06/20/20 0248 06/21/20 0924 06/22/20 0934 06/23/20 0151  INR 3.6* 3.3* 2.7* 2.5* 2.6*   Cardiac Enzymes: No results for input(s): CKTOTAL, CKMB, CKMBINDEX, TROPONINI in the last 168 hours. BNP (last 3 results) No results for input(s): PROBNP in the last 8760 hours. HbA1C: Recent Labs    06/22/20 0934  HGBA1C 8.2*   CBG: Recent Labs  Lab 06/22/20 2054 06/22/20 2108 06/23/20 0728 06/23/20 0804 06/23/20 1120  GLUCAP 171* 156* 107* 135* 177*   Lipid Profile: No results for input(s): CHOL, HDL, LDLCALC, TRIG, CHOLHDL, LDLDIRECT in the last 72 hours. Thyroid Function Tests: No results for input(s): TSH, T4TOTAL, FREET4, T3FREE, THYROIDAB in the last 72 hours. Anemia Panel: No results for input(s): VITAMINB12, FOLATE, FERRITIN, TIBC, IRON, RETICCTPCT in the last 72 hours.  Urine analysis:    Component Value Date/Time   COLORURINE YELLOW 06/21/2020 1059   APPEARANCEUR CLEAR 06/21/2020 1059   LABSPEC 1.020 06/21/2020 1059   PHURINE 5.5 06/21/2020 1059   GLUCOSEU NEGATIVE 06/21/2020 1059   HGBUR LARGE (A) 06/21/2020 1059   BILIRUBINUR NEGATIVE  06/21/2020 1059   KETONESUR NEGATIVE 06/21/2020 1059   PROTEINUR 100 (A) 06/21/2020 1059   UROBILINOGEN 0.2 05/16/2014 0822   NITRITE NEGATIVE 06/21/2020 1059   LEUKOCYTESUR NEGATIVE 06/21/2020 1059   Recent Results (from the past 240 hour(s))  SARS CORONAVIRUS 2 (TAT 6-24 HRS) Nasopharyngeal Urine, Clean Catch     Status: None   Collection Time: 06/19/20  6:38 PM   Specimen: Urine, Clean Catch; Nasopharyngeal  Result Value Ref Range Status   SARS Coronavirus 2 NEGATIVE NEGATIVE Final    Comment: (NOTE) SARS-CoV-2 target nucleic acids are NOT DETECTED.  The SARS-CoV-2 RNA is generally detectable in upper and lower respiratory specimens during the acute phase of infection. Negative results do not preclude SARS-CoV-2 infection, do not rule out co-infections with other pathogens, and should not be used as the sole basis for treatment or other patient management decisions. Negative results must be combined with clinical observations, patient history, and epidemiological information. The expected result is Negative.  Fact Sheet for Patients: SugarRoll.be  Fact Sheet for Healthcare Providers: https://www.woods-mathews.com/  This test is not yet approved or cleared by the [place] FDA and  has been authorized for detection and/or diagnosis of SARS-CoV-2 by FDA under an Emergency Use Authorization (EUA). This EUA will remain  in effect (meaning this test can be used) for the duration of the COVID-19 declaration under Se ction 564(b)(1) of the Act, 21 U.S.C. section 360bbb-3(b)(1), unless the authorization is terminated or revoked sooner.  Performed at [facility], [place] [address], [place], [place] [address]       Radiology Studies: US RENAL  Result Date: 06/21/2020 CLINICAL DATA:  Acute kidney injury. Patient with history of chronic kidney disease. EXAM: RENAL / URINARY TRACT ULTRASOUND COMPLETE COMPARISON:  Noncontrast  abdominal CT 12/26/2018 FINDINGS: Right Kidney: Renal measurements: 13.6 x 7.1 x 7.3 cm = volume: 360 mL. Lobulated renal contours with mild parenchymal thinning. No hydronephrosis. No evidence of stone or focal renal lesion. Left Kidney: Renal measurements: 12.8 x 8.0 x 7.5 cm = volume: 402 mL. Mild parenchymal thinning. No hydronephrosis. Technically limited evaluation due to habitus and patient's difficulty with breath hold. No obvious focal lesion. Bladder: Appears normal for degree of bladder distention. Other: None. IMPRESSION: 1. No hydronephrosis. 2. Bilateral renal parenchymal thinning. Technically limited evaluation, particularly of the left kidney, due to habitus. Electronically  Signed   By: Keith Rake M.D.   On: 06/21/2020 15:03   DG Chest Port 1 View  Result Date: 06/22/2020 CLINICAL DATA:  Preop evaluation EXAM: PORTABLE CHEST 1 VIEW COMPARISON:  06/19/2020 FINDINGS: Cardiac shadow remains enlarged. Aortic calcifications are again seen. Lungs are well aerated bilaterally. Pleural thickening is noted on the right likely related to fluid. Mild vascular congestion is seen stable in appearance from the prior exam. Stable left effusion is noted. IMPRESSION: New pleural thickening laterally on the right likely related to pleural fluid. The remainder of the exam is stable with CHF and left-sided effusion. Electronically Signed   By: Inez Catalina M.D.   On: 06/22/2020 17:19    Scheduled Meds:  atorvastatin  20 mg Oral Daily   ferrous sulfate  325 mg Oral Q breakfast   insulin aspart  0-15 Units Subcutaneous TID WC   insulin aspart  0-5 Units Subcutaneous QHS   insulin glargine  20 Units Subcutaneous Daily   metoprolol succinate  50 mg Oral Daily   pantoprazole  40 mg Oral Daily   polyethylene glycol  17 g Oral Daily   sodium chloride flush  3 mL Intravenous Q12H   Continuous Infusions:  sodium chloride     lactated ringers 50 mL/hr at 06/22/20 1659     LOS: 3 days   Time spent: 35  minutes.  Patrecia Pour, MD [facility] [email or web address] 06/23/2020, 12:44 PM

## 2020-06-23 NOTE — Progress Notes (Signed)
Mobility Specialist: Progress Note   06/23/20 1341  Mobility  Activity Refused mobility   Pt refused mobility d/t chest pain and coughing. Will f/u as able.   [facility] William Flores Mobility Specialist Mobility Specialist Phone: [phone]

## 2020-06-23 NOTE — Progress Notes (Signed)
Patient refused CPAP tonight. Patient still hurting from CPR.

## 2020-06-23 NOTE — Progress Notes (Signed)
Event monitor cancelled per Dr. Radford Pax

## 2020-06-23 NOTE — Progress Notes (Signed)
Patient ID: William Flores, male   DOB: 1950/12/03, 70 y.o.   MRN: [id number] Dr. Radford Pax request patients monitor order be changed from Cardiac Event monitor to 14 Day ZIO AT Long Term Monitor-Live Telemetry to be applied at discharge.  Collins Scotland, PA-C notified to please cancel Event Monitor order and enter order for 14 day ZIO AT for hospital EKG department to apply at discharge.  [employer] monitors will be notified to cancel enrollment for cardiac event monitor.

## 2020-06-23 NOTE — Progress Notes (Signed)
PT Cancellation Note  Patient Details Name: William Flores MRN: [id number] DOB: [birth year]   Cancelled Treatment:    Reason Eval/Treat Not Completed: Patient at procedure or test/unavailable. Will follow-up for PT treatment as schedule permits.  Mabeline Caras, PT, DPT Acute Rehabilitation Services  Pager [phone] Office [place] 06/23/2020, 7:30 AM

## 2020-06-23 NOTE — Progress Notes (Signed)
Pt procedure canceled for today per MD Carlean Purl. Will reschedule for tomorrow

## 2020-06-23 NOTE — Progress Notes (Signed)
   Patient Name: William Flores Date of Encounter: 06/23/2020, 7:46 AM    Subjective  Chest remains very sore He was on the schedule for an EGD and a colonoscopy today, I think scheduled by my partners last week but he has not had a colonoscopy prep and his INR is 2.6   Objective  BP (!) 154/83   Pulse (!) 101   Temp 98.1 F (36.7 C) (Oral)   Resp 18   Ht 5' 3"  (1.6 m)   Wt 122.5 kg   SpO2 97%   BMI 47.83 kg/m  Marked chest wall soreness status post CPR  CBC Latest Ref Rng & Units 06/23/2020 06/22/2020 06/21/2020  WBC 4.0 - 10.5 K/uL 6.6 6.0 6.6  Hemoglobin 13.0 - 17.0 g/dL 10.6(L) 10.7(L) 11.1(L)  Hematocrit 39.0 - 52.0 % 35.8(L) 36.1(L) 36.6(L)  Platelets 150 - 400 K/uL 120(L) 126(L) 105(L)    Lab Results  Component Value Date   INR 2.6 (H) 06/23/2020   INR 2.5 (H) 06/22/2020   INR 2.7 (H) 06/21/2020      Assessment and Plan  Heme positive anemia with a ferritin of 48 and iron saturation of 9% with high TIBC so iron deficiency anemia Anticoagulated INR currently higher than desirable for endoscopic procedures History of numerous colon polyps and negative EGD for iron deficiency anemia in 2020  VQ scan pending not yet done  Numerous comorbidities   He is not ready for procedures  I am changing the paradigm here I do not think he will tolerate a colonoscopy prep well and I think the yield on that given that it was done 18 months ago is very low with respect to her anemia.  Assuming his VQ scan is okay we will anticipate a push enteroscopy tomorrow  He is so sore he may not tolerate the box that a patient where is when they have a capsule endoscopy so that might have to wait we are looking into alternatives.  It does have to remain on the chest area because of the electronic communication that needs to take place between the capsule and the sensor.  Gatha Mayer, MD, [facility] 06/23/2020 7:50 AM

## 2020-06-23 NOTE — Progress Notes (Signed)
Patient off floor to Endo.   Donah Driver, RN

## 2020-06-23 NOTE — NC FL2 (Signed)
[place] LEVEL OF CARE SCREENING TOOL     IDENTIFICATION  Patient Name: William Flores Birthdate: 01-08-1950 Sex: male Admission Date (Current Location): 06/19/2020  [facility] and [place] Number:  Herbalist and Address:  [facility] [place], [place] [address] [place], [place] [address]      Provider Number: [id number]  Attending Physician Name and Address:  Patrecia Pour, MD  Relative Name and Phone Number:  [name] [phone]    Current Level of Care: Hospital Recommended Level of Care: [place] Prior Approval Number:    Date Approved/Denied:   PASRR Number: [id number]  Discharge Plan: SNF    Current Diagnoses: Patient Active Problem List   Diagnosis Date Noted   AKI (acute kidney injury) ([place])    Syncope and collapse    Supratherapeutic INR    Syncope 06/19/2020   Hypokalemia 06/19/2020   Fecal occult blood test positive 06/19/2020   Cor pulmonale, acute ([place]) 04/15/2019   Bilateral leg edema    Chronic renal failure    Pulmonary hypertension ([place]) 12/28/2018   Hyperlipidemia associated with type 2 diabetes mellitus ([place]) 05/18/2017   Morbid obesity with BMI of 45.0-49.9, adult ([place]) 05/18/2017   CKD stage 3 due to type 2 diabetes mellitus ([place]) 09/17/2015   MGUS (monoclonal gammopathy of unknown significance) 05/27/2015   Normocytic anemia 11/04/2014   Peripheral edema    Essential hypertension    DM (diabetes mellitus), type 2, uncontrolled, with renal complications ([place]) 65/68/1275   Dysphagia 04/20/2014   Atrial fibrillation with slow ventricular response ([place]) 04/14/2014   Anticoagulated on Coumadin 03/20/2014   Nonischemic cardiomyopathy ([place]) 02/20/2014   OSA (obstructive sleep apnea)    Right heart failure ([place]) 02/10/2014   Morbid obesity ([place]) 08/05/2013   History of colon cancer    Gout 05/25/2012   Hypertensive heart disease     Orientation RESPIRATION BLADDER Height & Weight     Self,  Time, Situation, Place  O2 (Nasal Cannula 4 liters) Continent Weight: 270 lb (122.5 kg) Height:  5' 3"  (160 cm)  BEHAVIORAL SYMPTOMS/MOOD NEUROLOGICAL BOWEL NUTRITION STATUS      Continent Diet (Please see discharge summary)  AMBULATORY STATUS COMMUNICATION OF NEEDS Skin   Limited Assist Verbally Other (Comment) (dry,abrasion Leg R,Ecchymosis abdomen lower)                       Personal Care Assistance Level of Assistance  Bathing, Feeding, Dressing Bathing Assistance: Limited assistance Feeding assistance: Independent Dressing Assistance: Limited assistance     Functional Limitations Info  Sight, Hearing, Speech Sight Info: Adequate Hearing Info: Adequate Speech Info: Adequate    SPECIAL CARE FACTORS FREQUENCY  PT (By licensed PT), OT (By licensed OT)     PT Frequency: 5x min weekly OT Frequency: 5x min weekly            Contractures Contractures Info: Not present    Additional Factors Info  Code Status, Allergies, Insulin Sliding Scale Code Status Info: FULL Allergies Info: Codeine   Insulin Sliding Scale Info: insulin aspart (novoLOG) injection 0-15 Units 3 times daily with meals,insulin aspart (novoLOG) injection 0-5 Units daily at bedtime,insulin glargine (LANTUS) injection 20 Units daily       Current Medications (06/23/2020):  This is the current hospital active medication list Current Facility-Administered Medications  Medication Dose Route Frequency Provider Last Rate Last Admin   0.9 %  sodium chloride infusion  250 mL Intravenous  PRN Nicolette Bang, MD       atorvastatin (LIPITOR) tablet 20 mg  20 mg Oral Daily Nicolette Bang, MD   20 mg at 06/23/20 6381   benzonatate (TESSALON) capsule 100 mg  100 mg Oral TID PRN Hosie Poisson, MD   100 mg at 06/23/20 7711   ferrous sulfate tablet 325 mg  325 mg Oral Q breakfast Nicolette Bang, MD   325 mg at 06/23/20 6579   insulin aspart (novoLOG) injection 0-15 Units  0-15 Units  Subcutaneous TID WC Nicolette Bang, MD   3 Units at 06/23/20 1218   insulin aspart (novoLOG) injection 0-5 Units  0-5 Units Subcutaneous QHS Nicolette Bang, MD       insulin glargine (LANTUS) injection 20 Units  20 Units Subcutaneous Daily Nicolette Bang, MD   20 Units at 06/23/20 0956   metoprolol succinate (TOPROL-XL) 24 hr tablet 50 mg  50 mg Oral Daily Nicolette Bang, MD   50 mg at 06/23/20 0956   morphine 2 MG/ML injection 1-2 mg  1-2 mg Intravenous Q4H PRN Hosie Poisson, MD   2 mg at 06/23/20 1349   pantoprazole (PROTONIX) EC tablet 40 mg  40 mg Oral Daily Nicolette Bang, MD   40 mg at 06/23/20 0956   polyethylene glycol (MIRALAX / GLYCOLAX) packet 17 g  17 g Oral Daily Nicolette Bang, MD   17 g at 06/23/20 0956   sodium chloride (OCEAN) 0.65 % nasal spray 1 spray  1 spray Each Nare PRN Opyd, Ilene Qua, MD   1 spray at 06/22/20 2133   sodium chloride flush (NS) 0.9 % injection 3 mL  3 mL Intravenous Q12H Nicolette Bang, MD   3 mL at 06/23/20 1001   sodium chloride flush (NS) 0.9 % injection 3 mL  3 mL Intravenous PRN Nicolette Bang, MD         Discharge Medications: Please see discharge summary for a list of discharge medications.  Relevant Imaging Results:  Relevant Lab Results:   Additional Information [id number]  Trula Ore, LCSWA

## 2020-06-24 ENCOUNTER — Encounter (HOSPITAL_COMMUNITY): Payer: Self-pay | Admitting: Internal Medicine

## 2020-06-24 ENCOUNTER — Inpatient Hospital Stay (HOSPITAL_COMMUNITY): Payer: Medicare Other

## 2020-06-24 ENCOUNTER — Inpatient Hospital Stay (HOSPITAL_COMMUNITY): Payer: Medicare Other | Admitting: Anesthesiology

## 2020-06-24 ENCOUNTER — Encounter (HOSPITAL_COMMUNITY)
Admission: EM | Disposition: A | Discharge status: Skilled Nursing Facility | Payer: Self-pay | Source: Home / Self Care | Attending: Family Medicine

## 2020-06-24 DIAGNOSIS — K2950 Unspecified chronic gastritis without bleeding: Secondary | ICD-10-CM

## 2020-06-24 HISTORY — PX: ENTEROSCOPY: SHX5533

## 2020-06-24 LAB — CBC WITH DIFFERENTIAL/PLATELET
Abs Immature Granulocytes: 0.04 10*3/uL (ref 0.00–0.07)
Basophils Absolute: 0.1 10*3/uL (ref 0.0–0.1)
Basophils Relative: 2 %
Eosinophils Absolute: 0.3 10*3/uL (ref 0.0–0.5)
Eosinophils Relative: 5 %
HCT: 37.7 % — ABNORMAL LOW (ref 39.0–52.0)
Hemoglobin: 11 g/dL — ABNORMAL LOW (ref 13.0–17.0)
Immature Granulocytes: 1 %
Lymphocytes Relative: 11 %
Lymphs Abs: 0.7 10*3/uL (ref 0.7–4.0)
MCH: 28.2 pg (ref 26.0–34.0)
MCHC: 29.2 g/dL — ABNORMAL LOW (ref 30.0–36.0)
MCV: 96.7 fL (ref 80.0–100.0)
Monocytes Absolute: 1 10*3/uL (ref 0.1–1.0)
Monocytes Relative: 14 %
Neutro Abs: 4.5 10*3/uL (ref 1.7–7.7)
Neutrophils Relative %: 67 %
Platelets: 108 10*3/uL — ABNORMAL LOW (ref 150–400)
RBC: 3.9 MIL/uL — ABNORMAL LOW (ref 4.22–5.81)
RDW: 17.2 % — ABNORMAL HIGH (ref 11.5–15.5)
WBC: 6.6 10*3/uL (ref 4.0–10.5)
nRBC: 0 % (ref 0.0–0.2)

## 2020-06-24 LAB — PROTIME-INR
INR: 2.5 — ABNORMAL HIGH (ref 0.8–1.2)
Prothrombin Time: 27 seconds — ABNORMAL HIGH (ref 11.4–15.2)

## 2020-06-24 LAB — GLUCOSE, CAPILLARY
Glucose-Capillary: 126 mg/dL — ABNORMAL HIGH (ref 70–99)
Glucose-Capillary: 99 mg/dL (ref 70–99)
Glucose-Capillary: 145 mg/dL — ABNORMAL HIGH (ref 70–99)
Glucose-Capillary: 175 mg/dL — ABNORMAL HIGH (ref 70–99)

## 2020-06-24 SURGERY — ENTEROSCOPY
Anesthesia: Monitor Anesthesia Care

## 2020-06-24 MED ORDER — TORSEMIDE 20 MG PO TABS
40.0000 mg | ORAL_TABLET | Freq: Two times a day (BID) | ORAL | Status: DC
  Administered 2020-06-24 – 2020-06-25 (×3): 40 mg via ORAL
  Filled 2020-06-24 (×3): qty 2

## 2020-06-24 MED ORDER — PROPOFOL 10 MG/ML IV BOLUS
INTRAVENOUS | Status: DC | PRN
  Administered 2020-06-24: 30 mg via INTRAVENOUS

## 2020-06-24 MED ORDER — PHENYLEPHRINE HCL-NACL 10-0.9 MG/250ML-% IV SOLN
INTRAVENOUS | Status: DC | PRN
  Administered 2020-06-24: 25 ug/min via INTRAVENOUS

## 2020-06-24 MED ORDER — PROPOFOL 500 MG/50ML IV EMUL
INTRAVENOUS | Status: DC | PRN
  Administered 2020-06-24: 50 ug/kg/min via INTRAVENOUS

## 2020-06-24 MED ORDER — LIDOCAINE 2% (20 MG/ML) 5 ML SYRINGE
INTRAMUSCULAR | Status: DC | PRN
  Administered 2020-06-24: 60 mg via INTRAVENOUS

## 2020-06-24 MED ORDER — WARFARIN SODIUM 2.5 MG PO TABS
2.5000 mg | ORAL_TABLET | Freq: Once | ORAL | Status: AC
  Administered 2020-06-24: 2.5 mg via ORAL
  Filled 2020-06-24: qty 1

## 2020-06-24 MED ORDER — SODIUM CHLORIDE 0.9 % IV SOLN: INTRAVENOUS | Status: DC

## 2020-06-24 MED ORDER — WARFARIN - PHARMACIST DOSING INPATIENT: Freq: Every day | Status: DC

## 2020-06-24 MED ORDER — BUTAMBEN-TETRACAINE-BENZOCAINE 2-2-14 % EX AERO
INHALATION_SPRAY | CUTANEOUS | Status: DC | PRN
  Administered 2020-06-24: 2 via TOPICAL

## 2020-06-24 MED ORDER — TORSEMIDE 20 MG PO TABS: 20.0000 mg | ORAL_TABLET | Freq: Every morning | ORAL | Status: DC

## 2020-06-24 NOTE — Progress Notes (Signed)
Pt refused CPAP tonight.

## 2020-06-24 NOTE — Progress Notes (Signed)
PROGRESS NOTE  William Flores  FGH:[id number] DOB: 03-Feb-1950 DOA: 06/19/2020 PCP: [name], [name], MD   Brief Narrative: William Flores is a 69 y.o. male with a history of chronic 2L O2-dependent respiratory failure, IDA on iron supplement, colon CA s/p resection 2002, chronic atrial fibrillation on coumadin, CKD stage III, T2DM, HTN, HLD, OSA, chronic HFpEF who presented to the ED 6/17 from his PCP's office after an episode of syncope vs. cardiac arrest. Code was called, ROSC achieved in the office, no shockable rhythm detected. He was transported to [facility] where he complained of chest pain over the sternum. SCr 1.77, BUN 39, INR 3.6, hgb 9g/dl (baseline ~12), +FOBT, platelets 86k, ferritin 48, iron 36, WBC normal.   Assessment & Plan: Active Problems:   Hypertensive heart disease   History of colon cancer   Morbid obesity ([place])   Right heart failure (HCC)   OSA (obstructive sleep apnea)   Nonischemic cardiomyopathy (HCC)   Anticoagulated on Coumadin   Atrial fibrillation with slow ventricular response (HCC)   DM (diabetes mellitus), type 2, uncontrolled, with renal complications (HCC)   Normocytic anemia   CKD stage 3 due to type 2 diabetes mellitus ([place])   Hyperlipidemia associated with type 2 diabetes mellitus ([place])   Pulmonary hypertension ([place])   Syncope   Hypokalemia   Fecal occult blood test positive   AKI (acute kidney injury) ([place])   Syncope and collapse   Supratherapeutic INR   Chronic gastritis without bleeding  Syncope vs. cardiac arrest: Echo shows worsening RV failure, pulmonary HTN, but no regional WMA and low-normal preserved LVEF. Mild troponin elevation due to demand ischemia.  - Cardiology consulted, planning nuclear stress testing as outpatient as well as 30-day event monitor. Continue cardiac monitoring while here. - Cardiology signed off, recommending to continue metoprolol, statin.   Acute blood loss anemia on chronic (unexplained) iron deficiency anemia:  Suspect to be due to GI bleeding with +FOBT in setting of supratherapeutic INR.  - Hgb stabilized. - Enteroscopy 6/22 revealed known gastropathy and no other bleeding sources. GI recommends colonoscopy not required and VCE may not be tolerated due to box on chest.  - Continue PPI - Continue po iron.  Chronic atrial fibrillation, PVCs:  - INR 2.5, at goal. Can reinitiate coumadin per pharmacy consultation.  Pulmonary HTN, RV failure, chronic HFpEF and history of chronic combined HFrEF (tachycardia-mediated and improved on recent echo):  - Cardiology restarted sildenafil. Restart demadex  - Monitor I/O, weights - Check perfusion scan given worsening RV function/pulm HTN.   AKI on stage IIIa CKD: Hx of requiring CVVHD for AKI.  - Hold losartan, avoid hypotension. Renal US consistent with chronic disease, bilateral parenchymal thinning, no hydro. SCr above baseline, FENa 0.3%, so gave limited IVF.  stop these and await nephrology recommendations regarding diuretic reinitiation.  T2DM: HbA1c 8.2% - Continue SSI, at inpatient goal.  Hypokalemia: Supplemented - Recheck in AM.  HLD:  - Continue statin  Thrombocytopenia: Chronic  Chronic hypoxic respiratory failure, OSA: At baseline. - Perfusion scan as above - Continue supplemental oxygen, etc.  - Pt having productive cough and is set up for pneumonia due to atelectasis due to chest wall pain. Will check CXR (perfusion scan still pending as well).   Obesity: Estimated body mass index is 49.05 kg/m as calculated from the following:   Height as of this encounter: 5' 3"  (1.6 m).   Weight as of this encounter: 125.6 kg.  DVT prophylaxis: SCDs Code Status: Full  Family Communication: None at bedside Disposition Plan:  Status is: Inpatient  Remains inpatient appropriate because:Ongoing diagnostic testing needed not appropriate for outpatient work up  Dispo: The patient is from: Home              Anticipated d/c is to: Home               Patient currently is not medically stable to d/c.   Difficult to place patient No  Consultants:  Cardiology GI  Procedures:  EGD/push enteroscopy planned 6/22.  Antimicrobials: None   Subjective: Having come cough over the past day that is worse since procedure this AM, productive of yellow sputum. Chest pain is stable, severe, constant.  Objective: Vitals:   06/24/20 0934 06/24/20 0942 06/24/20 0952 06/24/20 1032  BP: (!) 104/51 124/84 (!) 146/91 (!) 149/92  Pulse: 96 92 (!) 102 100  Resp: 19 (!) 24 (!) 21 20  Temp: 98.1 F (36.7 C)     TempSrc: Temporal     SpO2: 92% 95% 92% 97%  Weight:      Height:        Intake/Output Summary (Last 24 hours) at 06/24/2020 1426 Last data filed at 06/24/2020 1145 Gross per 24 hour  Intake 477 ml  Output 0 ml  Net 477 ml   Filed Weights   06/23/20 0715 06/24/20 0612 06/24/20 0817  Weight: 122.5 kg 125.6 kg 125.6 kg   Gen: 70 y.o. male in no distress Pulm: Nonlabored breathing, diminished excursions and crackles at bases. Chest wall remains exquisitely tender. CV: Regular rate and rhythm. No murmur, rub, or gallop. No JVD, no pitting dependent edema. GI: Abdomen soft, non-tender, non-distended, with normoactive bowel sounds.  Ext: Warm, no deformities Skin: No rashes, lesions or ulcers on visualized skin. Neuro: Alert and oriented. No focal neurological deficits. Psych: Judgement and insight appear fair. Mood euthymic & affect congruent. Behavior is appropriate.  Data Reviewed: I have personally reviewed following labs and imaging studies  CBC: Recent Labs  Lab 06/19/20 1550 06/20/20 0248 06/21/20 0222 06/22/20 0934 06/23/20 0151 06/24/20 0348  WBC 5.3 5.7 6.6 6.0 6.6 6.6  NEUTROABS 4.4  --  4.9 4.4 4.8 4.5  HGB 9.0* 10.3* 11.1* 10.7* 10.6* 11.0*  HCT 30.6* 34.8* 36.6* 36.1* 35.8* 37.7*  MCV 97.1 96.7 96.1 96.3 96.8 96.7  PLT 86* 121* 105* 126* 120* 878*   Basic Metabolic Panel: Recent Labs  Lab 06/19/20 1550  06/19/20 2316 06/20/20 0248 06/21/20 0222 06/22/20 0934 06/23/20 0151  NA 141  --  140 139 137 137  K 3.1*  --  4.3 4.8 4.6 4.4  CL 113*  --  108 106 107 103  CO2 20*  --  22 25 25 22   GLUCOSE 153*  --  166* 153* 123* 126*  BUN 39*  --  49* 54* 56* 57*  CREATININE 1.77*  --  2.23* 2.41* 2.38* 2.27*  CALCIUM 7.0*  --  9.2 9.1 9.0 9.1  MG  --  2.1  --   --   --   --    GFR: Estimated Creatinine Clearance: 37.2 mL/min (A) (by C-G formula based on SCr of 2.27 mg/dL (H)). Liver Function Tests: Recent Labs  Lab 06/19/20 1550 06/21/20 0222  AST 13* 16  ALT 11 10  ALKPHOS 49 63  BILITOT 0.7 0.9  PROT 4.7* 6.6  ALBUMIN 2.4* 3.2*   No results for input(s): LIPASE, AMYLASE in the last 168 hours. No results for input(s): AMMONIA  in the last 168 hours. Coagulation Profile: Recent Labs  Lab 06/20/20 0248 06/21/20 0924 06/22/20 0934 06/23/20 0151 06/24/20 0348  INR 3.3* 2.7* 2.5* 2.6* 2.5*   Cardiac Enzymes: No results for input(s): CKTOTAL, CKMB, CKMBINDEX, TROPONINI in the last 168 hours. BNP (last 3 results) No results for input(s): PROBNP in the last 8760 hours. HbA1C: Recent Labs    06/22/20 0934  HGBA1C 8.2*   CBG: Recent Labs  Lab 06/23/20 1120 06/23/20 1622 06/23/20 2120 06/24/20 0745 06/24/20 1203  GLUCAP 177* 153* 155* 126* 99   Lipid Profile: No results for input(s): CHOL, HDL, LDLCALC, TRIG, CHOLHDL, LDLDIRECT in the last 72 hours. Thyroid Function Tests: No results for input(s): TSH, T4TOTAL, FREET4, T3FREE, THYROIDAB in the last 72 hours. Anemia Panel: No results for input(s): VITAMINB12, FOLATE, FERRITIN, TIBC, IRON, RETICCTPCT in the last 72 hours.  Urine analysis:    Component Value Date/Time   COLORURINE YELLOW 06/21/2020 1059   APPEARANCEUR CLEAR 06/21/2020 1059   LABSPEC 1.020 06/21/2020 1059   PHURINE 5.5 06/21/2020 1059   GLUCOSEU NEGATIVE 06/21/2020 1059   HGBUR LARGE (A) 06/21/2020 1059   BILIRUBINUR NEGATIVE 06/21/2020 1059    KETONESUR NEGATIVE 06/21/2020 1059   PROTEINUR 100 (A) 06/21/2020 1059   UROBILINOGEN 0.2 05/16/2014 0822   NITRITE NEGATIVE 06/21/2020 1059   LEUKOCYTESUR NEGATIVE 06/21/2020 1059   Recent Results (from the past 240 hour(s))  SARS CORONAVIRUS 2 (TAT 6-24 HRS) Nasopharyngeal Urine, Clean Catch     Status: None   Collection Time: 06/19/20  6:38 PM   Specimen: Urine, Clean Catch; Nasopharyngeal  Result Value Ref Range Status   SARS Coronavirus 2 NEGATIVE NEGATIVE Final    Comment: (NOTE) SARS-CoV-2 target nucleic acids are NOT DETECTED.  The SARS-CoV-2 RNA is generally detectable in upper and lower respiratory specimens during the acute phase of infection. Negative results do not preclude SARS-CoV-2 infection, do not rule out co-infections with other pathogens, and should not be used as the sole basis for treatment or other patient management decisions. Negative results must be combined with clinical observations, patient history, and epidemiological information. The expected result is Negative.  Fact Sheet for Patients: SugarRoll.be  Fact Sheet for Healthcare Providers: https://www.woods-mathews.com/  This test is not yet approved or cleared by the [place] FDA and  has been authorized for detection and/or diagnosis of SARS-CoV-2 by FDA under an Emergency Use Authorization (EUA). This EUA will remain  in effect (meaning this test can be used) for the duration of the COVID-19 declaration under Se ction 564(b)(1) of the Act, 21 U.S.C. section 360bbb-3(b)(1), unless the authorization is terminated or revoked sooner.  Performed at [facility], [place] [address], [place], [place] [address]       Radiology Studies: DG Chest Port 1 View  Result Date: 06/22/2020 CLINICAL DATA:  Preop evaluation EXAM: PORTABLE CHEST 1 VIEW COMPARISON:  06/19/2020 FINDINGS: Cardiac shadow remains enlarged. Aortic calcifications are again seen. Lungs  are well aerated bilaterally. Pleural thickening is noted on the right likely related to fluid. Mild vascular congestion is seen stable in appearance from the prior exam. Stable left effusion is noted. IMPRESSION: New pleural thickening laterally on the right likely related to pleural fluid. The remainder of the exam is stable with CHF and left-sided effusion. Electronically Signed   By: Inez Catalina M.D.   On: 06/22/2020 17:19    Scheduled Meds:  atorvastatin  20 mg Oral Daily   ferrous sulfate  325 mg Oral Q breakfast  insulin aspart  0-15 Units Subcutaneous TID WC   insulin aspart  0-5 Units Subcutaneous QHS   insulin glargine  20 Units Subcutaneous Daily   metoprolol succinate  50 mg Oral Daily   pantoprazole  40 mg Oral Daily   polyethylene glycol  17 g Oral Daily   sodium chloride flush  3 mL Intravenous Q12H   Continuous Infusions:  sodium chloride       LOS: 4 days   Time spent: 35 minutes.  Patrecia Pour, MD [facility] [email or web address] 06/24/2020, 2:26 PM

## 2020-06-24 NOTE — Anesthesia Postprocedure Evaluation (Signed)
Anesthesia Post Note  Patient: William Flores  Procedure(s) Performed: ENTEROSCOPY     Patient location during evaluation: Endoscopy Anesthesia Type: MAC Level of consciousness: awake and alert Pain management: pain level controlled Vital Signs Assessment: post-procedure vital signs reviewed and stable Respiratory status: spontaneous breathing, nonlabored ventilation and respiratory function stable Cardiovascular status: blood pressure returned to baseline and stable Postop Assessment: no apparent nausea or vomiting Anesthetic complications: no   No notable events documented.  Last Vitals:  Vitals:   06/24/20 0952 06/24/20 1032  BP: (!) 146/91 (!) 149/92  Pulse: (!) 102 100  Resp: (!) 21 20  Temp:    SpO2: 92% 97%    Last Pain:  Vitals:   06/24/20 1032  TempSrc:   PainSc: 0-No pain                 Merlinda Frederick

## 2020-06-24 NOTE — Interval H&P Note (Signed)
History and Physical Interval Note:  06/24/2020 8:47 AM  William Flores  has presented today for surgery, with the diagnosis of anemia, heme positive.  The various methods of treatment have been discussed with the patient and family. After consideration of risks, benefits and other options for treatment, the patient has consented to  Procedure(s): ENTEROSCOPY (N/A) as a surgical intervention.  The patient's history has been reviewed, patient examined, no change in status, stable for surgery.  I have reviewed the patient's chart and labs.  Questions were answered to the patient's satisfaction.     Silvano Rusk

## 2020-06-24 NOTE — Anesthesia Preprocedure Evaluation (Addendum)
Anesthesia Evaluation  Patient identified by MRN, date of birth, ID band Patient awake    Reviewed: Allergy & Precautions, H&P , NPO status , Patient's Chart, lab work & pertinent test results  Airway Mallampati: II  TM Distance: >3 FB Neck ROM: Full    Dental no notable dental hx. (+) Teeth Intact, Poor Dentition, Dental Advisory Given, Missing   Pulmonary sleep apnea (on home O2 (2L)) and Oxygen sleep apnea , former smoker,    Pulmonary exam normal breath sounds clear to auscultation       Cardiovascular Exercise Tolerance: Good hypertension, Pt. on medications and Pt. on home beta blockers + CAD  Normal cardiovascular exam+ dysrhythmias (INR 2.5) Atrial Fibrillation  Rhythm:Regular Rate:Normal  ECHO 6/22 1. Technically difficult study despite Definity contrast 2. Left ventricular ejection fraction, by estimation, is 50 to 55%. The left ventricle has low normal function. Left ventricular endocardial border not optimally defined to evaluate regional wall motion. There is mild left ventricular hypertrophy. Left ventricular diastolic function could not be evaluated. There is the interventricular septum is flattened in systole and diastole, consistent with right ventricular pressure and volume overload. 3. Right ventricular systolic function is severely reduced. The right ventricular size is severely enlarged. There is severely elevated pulmonary artery systolic pressure. The estimated right ventricular systolic pressure is 17.4 mmHg. 4. The mitral valve is abnormal. Mild mitral valve regurgitation. 5. The aortic valve is tricuspid. Aortic valve regurgitation is not visualized. Mild aortic valve sclerosis is present, with no evidence of aortic valve stenosis. 6. The inferior vena cava is dilated in size with <50% respiratory variability, suggesting right atrial pressure of 15 mmHg.   Neuro/Psych negative neurological ROS  negative  psych ROS   GI/Hepatic negative GI ROS, Neg liver ROS,   Endo/Other  negative endocrine ROSdiabetes, Type 2, Insulin DependentMorbid obesity  Renal/GU CRFRenal diseasenegative Renal ROS  negative genitourinary   Musculoskeletal negative musculoskeletal ROS (+)   Abdominal   Peds negative pediatric ROS (+)  Hematology negative hematology ROS (+) Blood dyscrasia, anemia ,   Anesthesia Other Findings   Reproductive/Obstetrics negative OB ROS                           Anesthesia Physical  Anesthesia Plan  ASA: 4  Anesthesia Plan: MAC   Post-op Pain Management:    Induction: Intravenous  PONV Risk Score and Plan: 1  Airway Management Planned: Mask, Natural Airway and Simple Face Mask  Additional Equipment: None  Intra-op Plan:   Post-operative Plan:   Informed Consent: I have reviewed the patients History and Physical, chart, labs and discussed the procedure including the risks, benefits and alternatives for the proposed anesthesia with the patient or authorized representative who has indicated his/her understanding and acceptance.       Plan Discussed with: Anesthesiologist and CRNA  Anesthesia Plan Comments:         Anesthesia Quick Evaluation

## 2020-06-24 NOTE — Transfer of Care (Signed)
Immediate Anesthesia Transfer of Care Note  Patient: William Flores  Procedure(s) Performed: ENTEROSCOPY  Patient Location: PACU and Endoscopy Unit  Anesthesia Type:MAC  Level of Consciousness: drowsy and patient cooperative  Airway & Oxygen Therapy: Patient Spontanous Breathing and Patient connected to face mask oxygen  Post-op Assessment: Report given to RN and Post -op Vital signs reviewed and stable  Post vital signs: Reviewed and stable  Last Vitals:  Vitals Value Taken Time  BP 104/51 06/24/20 0934  Temp    Pulse 96 06/24/20 0934  Resp 19 06/24/20 0934  SpO2 91% 06/24/20 0934    Last Pain:  Vitals:   06/24/20 0817  TempSrc: Temporal  PainSc: 6       Patients Stated Pain Goal: 0 (09/81/19 1478)  Complications: No notable events documented.

## 2020-06-24 NOTE — Op Note (Signed)
[facility] Patient Name: William Flores Procedure Date : 06/24/2020 MRN: [id number] Attending MD: Gatha Mayer , MD Date of Birth: [birth year] CSN: [id number] Age: 70 Admit Type: Inpatient Procedure:                Small bowel enteroscopy Indications:              Iron deficiency anemia secondary to chronic blood                            loss, Occult blood in stool Providers:                Gatha Mayer, MD, Doristine Johns, RN, Benetta Spar, Technician Referring MD:              Medicines:                Propofol per Anesthesia, Monitored Anesthesia Care Complications:            No immediate complications. Estimated Blood Loss:     Estimated blood loss: none. Procedure:                Pre-Anesthesia Assessment:                           - Prior to the procedure, a History and Physical                            was performed, and patient medications and                            allergies were reviewed. The patient's tolerance of                            previous anesthesia was also reviewed. The risks                            and benefits of the procedure and the sedation                            options and risks were discussed with the patient.                            All questions were answered, and informed consent                            was obtained. Prior Anticoagulants: The patient                            last took Coumadin (warfarin) 5 days prior to the                            procedure. ASA Grade Assessment: IV - A patient  with severe systemic disease that is a constant                            threat to life. After reviewing the risks and                            benefits, the patient was deemed in satisfactory                            condition to undergo the procedure.                           After obtaining informed consent, the endoscope was                             passed under direct vision. Throughout the                            procedure, the patient's blood pressure, pulse, and                            oxygen saturations were monitored continuously. The                            PCF-H190DL ([id number] Olympus pediatric colonoscope                            was introduced through the mouth and advanced to                            the second part of duodenum. The small bowel                            enteroscopy was accomplished without difficulty.                            The patient tolerated the procedure well. Scope In: Scope Out: Findings:      Diffuse inflammation characterized by erythema, friability and       granularity was found in the gastric fundus and in the gastric body.      The examined esophagus was normal.      There was no evidence of significant pathology in the entire examined       duodenum.      There was no evidence of significant pathology in the proximal jejunum.      - Gastric retroflexion w/o other abnormalities Impression:               - Chronic gastritis. Known and biopsied before                           - Normal esophagus.                           - Normal examined duodenum.                           -  The examined portion of the jejunum was normal.                           - No specimens collected. Recommendation:           - Return patient to hospital ward for ongoing care.                           - No further GI eval now - he should recover and                            see outpatient GI                           Warfarin per primary team                           Signing off Procedure Code(s):        --- Professional ---                           [phone], Esophagogastroduodenoscopy, flexible,                            transoral; diagnostic, including collection of                            specimen(s) by brushing or washing, when performed                            (separate  procedure) Diagnosis Code(s):        --- Professional ---                           K29.50, Unspecified chronic gastritis without                            bleeding                           D50.0, Iron deficiency anemia secondary to blood                            loss (chronic)                           R19.5, Other fecal abnormalities CPT copyright 2019 American Medical Association. All rights reserved. The codes documented in this report are preliminary and upon coder review may  be revised to meet current compliance requirements. Gatha Mayer, MD 06/24/2020 9:49:21 AM This report has been signed electronically. Number of Addenda: 0

## 2020-06-24 NOTE — Progress Notes (Signed)
ANTICOAGULATION CONSULT NOTE - Initial Consult  Pharmacy Consult for warfarin Indication: atrial fibrillation  Allergies  Allergen Reactions   Codeine     Headache     Patient Measurements: Height: 5' 3"  (160 cm) Weight: 125.6 kg (276 lb 14.4 oz) IBW/kg (Calculated) : 56.9  Vital Signs: Temp: 98.1 F (36.7 C) (06/22 0934) Temp Source: Temporal (06/22 0934) BP: 149/92 (06/22 1032) Pulse Rate: 100 (06/22 1032)  Labs: Recent Labs    06/22/20 0934 06/23/20 0151 06/24/20 0348  HGB 10.7* 10.6* 11.0*  HCT 36.1* 35.8* 37.7*  PLT 126* 120* 108*  LABPROT 27.0* 28.1* 27.0*  INR 2.5* 2.6* 2.5*  CREATININE 2.38* 2.27*  --     Estimated Creatinine Clearance: 37.2 mL/min (A) (by C-G formula based on SCr of 2.27 mg/dL (H)).   Medical History: Past Medical History:  Diagnosis Date   CAD (coronary artery disease)    LHC 2/08:  mRCA 50%, o/w no sig CAD, EF 25%   Cardiomyopathy ([place])    EF 35-40% in 2/16 in setting of AF with RVR >> echo 5/16 with improved LVF with EF 65-70%   Chronic atrial fibrillation (HCC)    Chronic diastolic heart failure (HCC)    HFPF - EF ~65-70% (OFTEN EXACERBATED BY AFIB)   CKD (chronic kidney disease)    hx of worsening renal failure and hyperK+ in setting of acute diast CHF >> required CVVHD in 4/16 and dialysis x 1 in 5/16   CKD stage 3 due to type 2 diabetes mellitus ([place]) 09/17/2015   Colon cancer ([place])    Colon polyps 09/21/2012   Tubular adenoma   Diabetes ([place])    Hyperlipidemia    Hypertension    Obesity hypoventilation syndrome (HCC)    Renal insufficiency    Sleep apnea    Assessment: 70 year old male with history of afib on chronic coumadin. Warfarin dose not appear to have been given this admission, last dose 6/16. INR continues to be therapeutic at 2.5 today. Enteroscopy this am did not show any bleeding.   Prior to admit warfarin regimen is 63m every day except 7.5 on mondays. Hgb has been stable 10-11s. Plt low but stable at  108.   Goal of Therapy:  INR 2-3 Monitor platelets by anticoagulation protocol: Yes   Plan:  Given elevated INR off warfarin will be conservative with dosing and order 2.537mtonight Continue to follow daily INRs  FrErin HearingharmD., BCPS Clinical Pharmacist 06/24/2020 2:52 PM

## 2020-06-24 NOTE — Progress Notes (Signed)
[facility] ROUNDING NOTE   Subjective:   Interval History: 70 year old gentleman history of COPD O2 dependent history of colon cancer status postresection 2002 history of atrial fibrillation on Coumadin therapy stage III/IV chronic kidney disease follow-up with Dr. Theador Hawthorne in [place].  History of diastolic heart disease.  Presented primary care physician status post cardiac arrest and syncope.  Also was found to have acute blood loss anemia.  Appreciate assistance of gastroenterology.  Baseline creatinine appears to be at 2 to 2.5 mg/dL.  No M spike observed 04/09/2019 renal ultrasound no evidence of hydronephrosis urinalysis bland.  Regular nephrologist Dr. Theador Hawthorne.  We are holding his ARB and also [place].  We were consulted for volume management.  I think it is reasonable to restart a lower dose of torsemide such as 40 mg daily.  Blood pressure 149/92 pulse 100 temperature 98.1 O2 sats 98% 4 L nasal cannula  Urine output 650 cc 06/22/2020 425 cc 06/23/2020  Last creatinine 2.27 hemoglobin 11.0   Objective:  Vital signs in last 24 hours:  Temp:  [97.6 F (36.4 C)-99.4 F (37.4 C)] 98.1 F (36.7 C) (06/22 0934) Pulse Rate:  [79-102] 100 (06/22 1032) Resp:  [15-25] 20 (06/22 1032) BP: (99-156)/(51-92) 149/92 (06/22 1032) SpO2:  [92 %-98 %] 97 % (06/22 1032) Weight:  [125.6 kg] 125.6 kg (06/22 0817)  Weight change: -1.996 kg Filed Weights   06/23/20 0715 06/24/20 0612 06/24/20 0817  Weight: 122.5 kg 125.6 kg 125.6 kg    Intake/Output: I/O last 3 completed shifts: In: 237 [P.O.:237] Out: 575 [Urine:575]   Intake/Output this shift:  No intake/output data recorded.  CVS- RRR RS- CTA ABD- BS present soft non-distended EXT- no edema   Basic Metabolic Panel: Recent Labs  Lab 06/19/20 1550 06/19/20 2316 06/20/20 0248 06/21/20 0222 06/22/20 0934 06/23/20 0151  NA 141  --  140 139 137 137  K 3.1*  --  4.3 4.8 4.6 4.4  CL 113*  --  108 106 107 103  CO2  20*  --  22 25 25 22   GLUCOSE 153*  --  166* 153* 123* 126*  BUN 39*  --  49* 54* 56* 57*  CREATININE 1.77*  --  2.23* 2.41* 2.38* 2.27*  CALCIUM 7.0*  --  9.2 9.1 9.0 9.1  MG  --  2.1  --   --   --   --     Liver Function Tests: Recent Labs  Lab 06/19/20 1550 06/21/20 0222  AST 13* 16  ALT 11 10  ALKPHOS 49 63  BILITOT 0.7 0.9  PROT 4.7* 6.6  ALBUMIN 2.4* 3.2*   No results for input(s): LIPASE, AMYLASE in the last 168 hours. No results for input(s): AMMONIA in the last 168 hours.  CBC: Recent Labs  Lab 06/19/20 1550 06/20/20 0248 06/21/20 0222 06/22/20 0934 06/23/20 0151 06/24/20 0348  WBC 5.3 5.7 6.6 6.0 6.6 6.6  NEUTROABS 4.4  --  4.9 4.4 4.8 4.5  HGB 9.0* 10.3* 11.1* 10.7* 10.6* 11.0*  HCT 30.6* 34.8* 36.6* 36.1* 35.8* 37.7*  MCV 97.1 96.7 96.1 96.3 96.8 96.7  PLT 86* 121* 105* 126* 120* 108*    Cardiac Enzymes: No results for input(s): CKTOTAL, CKMB, CKMBINDEX, TROPONINI in the last 168 hours.  BNP: Invalid input(s): POCBNP  CBG: Recent Labs  Lab 06/23/20 0804 06/23/20 1120 06/23/20 1622 06/23/20 2120 06/24/20 0745  GLUCAP 135* 177* 153* 155* 126*    Microbiology: Results for orders placed or performed during the hospital  encounter of 06/19/20  SARS CORONAVIRUS 2 (TAT 6-24 HRS) Nasopharyngeal Urine, Clean Catch     Status: None   Collection Time: 06/19/20  6:38 PM   Specimen: Urine, Clean Catch; Nasopharyngeal  Result Value Ref Range Status   SARS Coronavirus 2 NEGATIVE NEGATIVE Final    Comment: (NOTE) SARS-CoV-2 target nucleic acids are NOT DETECTED.  The SARS-CoV-2 RNA is generally detectable in upper and lower respiratory specimens during the acute phase of infection. Negative results do not preclude SARS-CoV-2 infection, do not rule out co-infections with other pathogens, and should not be used as the sole basis for treatment or other patient management decisions. Negative results must be combined with clinical observations, patient  history, and epidemiological information. The expected result is Negative.  Fact Sheet for Patients: SugarRoll.be  Fact Sheet for Healthcare Providers: https://www.woods-mathews.com/  This test is not yet approved or cleared by the [place] FDA and  has been authorized for detection and/or diagnosis of SARS-CoV-2 by FDA under an Emergency Use Authorization (EUA). This EUA will remain  in effect (meaning this test can be used) for the duration of the COVID-19 declaration under Se ction 564(b)(1) of the Act, 21 U.S.C. section 360bbb-3(b)(1), unless the authorization is terminated or revoked sooner.  Performed at [facility], [place] [address], [place], [place] [address]     Coagulation Studies: Recent Labs    06/22/20 0934 06/23/20 0151 06/24/20 0348  LABPROT 27.0* 28.1* 27.0*  INR 2.5* 2.6* 2.5*    Urinalysis: No results for input(s): COLORURINE, LABSPEC, PHURINE, GLUCOSEU, HGBUR, BILIRUBINUR, KETONESUR, PROTEINUR, UROBILINOGEN, NITRITE, LEUKOCYTESUR in the last 72 hours.  Invalid input(s): APPERANCEUR    Imaging: DG Chest Port 1 View  Result Date: 06/22/2020 CLINICAL DATA:  Preop evaluation EXAM: PORTABLE CHEST 1 VIEW COMPARISON:  06/19/2020 FINDINGS: Cardiac shadow remains enlarged. Aortic calcifications are again seen. Lungs are well aerated bilaterally. Pleural thickening is noted on the right likely related to fluid. Mild vascular congestion is seen stable in appearance from the prior exam. Stable left effusion is noted. IMPRESSION: New pleural thickening laterally on the right likely related to pleural fluid. The remainder of the exam is stable with CHF and left-sided effusion. Electronically Signed   By: Inez Catalina M.D.   On: 06/22/2020 17:19     Medications:    sodium chloride      atorvastatin  20 mg Oral Daily   ferrous sulfate  325 mg Oral Q breakfast   insulin aspart  0-15 Units Subcutaneous TID WC    insulin aspart  0-5 Units Subcutaneous QHS   insulin glargine  20 Units Subcutaneous Daily   metoprolol succinate  50 mg Oral Daily   pantoprazole  40 mg Oral Daily   polyethylene glycol  17 g Oral Daily   sodium chloride flush  3 mL Intravenous Q12H   sodium chloride, albuterol, benzonatate, diphenhydrAMINE, guaiFENesin, morphine injection, sodium chloride, sodium chloride flush  Assessment/ Plan:  Chronic renal disease stage 3/4 Baseline creatinine appears to be about 2 to 2.5 mg/dL.  History of diabetes.  Patient already has a nephrologist.  He can follow-up with his regular nephrologist Dr. Theador Hawthorne.  Renal ultrasound showed no evidence of hydronephrosis.  Urinalysis is bland.  No M spike observed 04/09/2019.   Volume/hypertension agree with current plan to hold ARB.  I think it is reasonable to hold [place].  He does not appear particularly volume overloaded.  He is not taking fairly high-dose diuretics at home.  His last  2D echo showed normal ejection fraction 06/21/2020.  It may be reasonable to reintroduce a lower dose of torsemide such as 40 mg daily. Syncopal spell outpatient work-up with cardiology.  No malignant cardiac arrhythmias identified Anemia appears to be stable at this point. Diabetes as per primary service Atrial fibrillation as per primary service GI bleed status post endoscopy per Dr. Carlean Purl  Will sign off patient at this point.  Please reconsult if further help needed   LOS: [place] @TODAY @11 :03 AM

## 2020-06-24 NOTE — Anesthesia Procedure Notes (Signed)
Procedure Name: MAC Date/Time: 06/24/2020 9:10 AM Performed by: Lowella Dell, CRNA Pre-anesthesia Checklist: Patient identified, Emergency Drugs available, Suction available, Patient being monitored and Timeout performed Patient Re-evaluated:Patient Re-evaluated prior to induction Oxygen Delivery Method: Simple face mask Placement Confirmation: positive ETCO2 Dental Injury: Teeth and Oropharynx as per pre-operative assessment

## 2020-06-24 NOTE — TOC Progression Note (Signed)
Transition of Care [facility]) - Progression Note    Patient Details  Name: DANYAL WHITENACK MRN: [id number] Date of Birth: 12/09/1950  [facility] of [facility] CM/SW [place], [place] Phone Number: 06/24/2020, 12:58 PM  Clinical Narrative:     CSW spoke with patient at bedside and provided SNF bed offers. CSW provided patient with medicare compare rating list to review. Patient would like to review bed offer. CSW let patient know that TOC will follow up with him tomorrow on SNF choice/dc plan.CSW will continue to follow and assist with dc planning needs.   Expected Discharge Plan: Skilled Nursing Facility Barriers to Discharge: Continued Medical Work up  Expected Discharge Plan and Services Expected Discharge Plan: [place] In-house Referral: Clinical Social Work     Living arrangements for the past 2 months: Single Family Home                                       Social Determinants of Health (SDOH) Interventions    Readmission Risk Interventions Readmission Risk Prevention Plan 04/15/2019  Transportation Screening Complete  PCP or Specialist Appt within 3-5 Days Complete  HRI or [place] Complete  Social Work Consult for [place] Planning/Counseling Complete  Palliative Care Screening Not Applicable  Medication Review Press photographer) Complete  Some recent data might be hidden

## 2020-06-25 ENCOUNTER — Encounter (HOSPITAL_COMMUNITY): Payer: Self-pay | Admitting: Internal Medicine

## 2020-06-25 DIAGNOSIS — I4891 Unspecified atrial fibrillation: Secondary | ICD-10-CM | POA: Diagnosis not present

## 2020-06-25 DIAGNOSIS — R41841 Cognitive communication deficit: Secondary | ICD-10-CM | POA: Diagnosis not present

## 2020-06-25 DIAGNOSIS — R231 Pallor: Secondary | ICD-10-CM | POA: Diagnosis not present

## 2020-06-25 DIAGNOSIS — I110 Hypertensive heart disease with heart failure: Secondary | ICD-10-CM | POA: Diagnosis not present

## 2020-06-25 DIAGNOSIS — I130 Hypertensive heart and chronic kidney disease with heart failure and stage 1 through stage 4 chronic kidney disease, or unspecified chronic kidney disease: Secondary | ICD-10-CM | POA: Diagnosis not present

## 2020-06-25 DIAGNOSIS — G4733 Obstructive sleep apnea (adult) (pediatric): Secondary | ICD-10-CM | POA: Diagnosis not present

## 2020-06-25 DIAGNOSIS — E119 Type 2 diabetes mellitus without complications: Secondary | ICD-10-CM | POA: Diagnosis not present

## 2020-06-25 DIAGNOSIS — R5381 Other malaise: Secondary | ICD-10-CM | POA: Diagnosis not present

## 2020-06-25 DIAGNOSIS — Z79899 Other long term (current) drug therapy: Secondary | ICD-10-CM | POA: Diagnosis not present

## 2020-06-25 DIAGNOSIS — Z7401 Bed confinement status: Secondary | ICD-10-CM | POA: Diagnosis not present

## 2020-06-25 DIAGNOSIS — N179 Acute kidney failure, unspecified: Secondary | ICD-10-CM | POA: Diagnosis not present

## 2020-06-25 DIAGNOSIS — N183 Chronic kidney disease, stage 3 unspecified: Secondary | ICD-10-CM | POA: Diagnosis not present

## 2020-06-25 DIAGNOSIS — D509 Iron deficiency anemia, unspecified: Secondary | ICD-10-CM | POA: Diagnosis not present

## 2020-06-25 DIAGNOSIS — J9611 Chronic respiratory failure with hypoxia: Secondary | ICD-10-CM | POA: Diagnosis not present

## 2020-06-25 DIAGNOSIS — Z8249 Family history of ischemic heart disease and other diseases of the circulatory system: Secondary | ICD-10-CM | POA: Diagnosis not present

## 2020-06-25 DIAGNOSIS — D472 Monoclonal gammopathy: Secondary | ICD-10-CM | POA: Diagnosis not present

## 2020-06-25 DIAGNOSIS — E1129 Type 2 diabetes mellitus with other diabetic kidney complication: Secondary | ICD-10-CM | POA: Diagnosis not present

## 2020-06-25 DIAGNOSIS — Z7984 Long term (current) use of oral hypoglycemic drugs: Secondary | ICD-10-CM | POA: Diagnosis not present

## 2020-06-25 DIAGNOSIS — J449 Chronic obstructive pulmonary disease, unspecified: Secondary | ICD-10-CM | POA: Diagnosis not present

## 2020-06-25 DIAGNOSIS — R791 Abnormal coagulation profile: Secondary | ICD-10-CM | POA: Diagnosis not present

## 2020-06-25 DIAGNOSIS — E876 Hypokalemia: Secondary | ICD-10-CM | POA: Diagnosis not present

## 2020-06-25 DIAGNOSIS — R55 Syncope and collapse: Secondary | ICD-10-CM | POA: Diagnosis not present

## 2020-06-25 DIAGNOSIS — K2950 Unspecified chronic gastritis without bleeding: Secondary | ICD-10-CM | POA: Diagnosis not present

## 2020-06-25 DIAGNOSIS — R2689 Other abnormalities of gait and mobility: Secondary | ICD-10-CM | POA: Diagnosis not present

## 2020-06-25 DIAGNOSIS — E785 Hyperlipidemia, unspecified: Secondary | ICD-10-CM | POA: Diagnosis not present

## 2020-06-25 DIAGNOSIS — I5020 Unspecified systolic (congestive) heart failure: Secondary | ICD-10-CM | POA: Diagnosis not present

## 2020-06-25 DIAGNOSIS — E669 Obesity, unspecified: Secondary | ICD-10-CM | POA: Diagnosis not present

## 2020-06-25 DIAGNOSIS — Z7901 Long term (current) use of anticoagulants: Secondary | ICD-10-CM | POA: Diagnosis not present

## 2020-06-25 DIAGNOSIS — Z87891 Personal history of nicotine dependence: Secondary | ICD-10-CM | POA: Diagnosis not present

## 2020-06-25 DIAGNOSIS — I428 Other cardiomyopathies: Secondary | ICD-10-CM | POA: Diagnosis not present

## 2020-06-25 DIAGNOSIS — Z794 Long term (current) use of insulin: Secondary | ICD-10-CM | POA: Diagnosis not present

## 2020-06-25 DIAGNOSIS — G4730 Sleep apnea, unspecified: Secondary | ICD-10-CM | POA: Diagnosis not present

## 2020-06-25 DIAGNOSIS — M6281 Muscle weakness (generalized): Secondary | ICD-10-CM | POA: Diagnosis not present

## 2020-06-25 DIAGNOSIS — I272 Pulmonary hypertension, unspecified: Secondary | ICD-10-CM | POA: Diagnosis not present

## 2020-06-25 DIAGNOSIS — Z9989 Dependence on other enabling machines and devices: Secondary | ICD-10-CM | POA: Diagnosis not present

## 2020-06-25 DIAGNOSIS — I482 Chronic atrial fibrillation, unspecified: Secondary | ICD-10-CM | POA: Diagnosis not present

## 2020-06-25 DIAGNOSIS — Z885 Allergy status to narcotic agent status: Secondary | ICD-10-CM | POA: Diagnosis not present

## 2020-06-25 DIAGNOSIS — I2729 Other secondary pulmonary hypertension: Secondary | ICD-10-CM | POA: Diagnosis not present

## 2020-06-25 DIAGNOSIS — I50812 Chronic right heart failure: Secondary | ICD-10-CM | POA: Diagnosis not present

## 2020-06-25 DIAGNOSIS — E1122 Type 2 diabetes mellitus with diabetic chronic kidney disease: Secondary | ICD-10-CM | POA: Diagnosis not present

## 2020-06-25 DIAGNOSIS — Z9981 Dependence on supplemental oxygen: Secondary | ICD-10-CM | POA: Diagnosis not present

## 2020-06-25 DIAGNOSIS — R531 Weakness: Secondary | ICD-10-CM | POA: Diagnosis not present

## 2020-06-25 DIAGNOSIS — I5042 Chronic combined systolic (congestive) and diastolic (congestive) heart failure: Secondary | ICD-10-CM | POA: Diagnosis not present

## 2020-06-25 DIAGNOSIS — E1169 Type 2 diabetes mellitus with other specified complication: Secondary | ICD-10-CM | POA: Diagnosis not present

## 2020-06-25 DIAGNOSIS — R195 Other fecal abnormalities: Secondary | ICD-10-CM | POA: Diagnosis not present

## 2020-06-25 DIAGNOSIS — Z85038 Personal history of other malignant neoplasm of large intestine: Secondary | ICD-10-CM | POA: Diagnosis not present

## 2020-06-25 DIAGNOSIS — I2781 Cor pulmonale (chronic): Secondary | ICD-10-CM | POA: Diagnosis not present

## 2020-06-25 DIAGNOSIS — I50810 Right heart failure, unspecified: Secondary | ICD-10-CM | POA: Diagnosis not present

## 2020-06-25 LAB — CBC WITH DIFFERENTIAL/PLATELET
Abs Immature Granulocytes: 0.03 10*3/uL (ref 0.00–0.07)
Basophils Absolute: 0.1 10*3/uL (ref 0.0–0.1)
Basophils Relative: 1 %
Eosinophils Absolute: 0.4 10*3/uL (ref 0.0–0.5)
Eosinophils Relative: 6 %
HCT: 34.7 % — ABNORMAL LOW (ref 39.0–52.0)
Hemoglobin: 10.3 g/dL — ABNORMAL LOW (ref 13.0–17.0)
Immature Granulocytes: 1 %
Lymphocytes Relative: 8 %
Lymphs Abs: 0.5 10*3/uL — ABNORMAL LOW (ref 0.7–4.0)
MCH: 28.2 pg (ref 26.0–34.0)
MCHC: 29.7 g/dL — ABNORMAL LOW (ref 30.0–36.0)
MCV: 95.1 fL (ref 80.0–100.0)
Monocytes Absolute: 0.9 10*3/uL (ref 0.1–1.0)
Monocytes Relative: 14 %
Neutro Abs: 4.6 10*3/uL (ref 1.7–7.7)
Neutrophils Relative %: 70 %
Platelets: 113 10*3/uL — ABNORMAL LOW (ref 150–400)
RBC: 3.65 MIL/uL — ABNORMAL LOW (ref 4.22–5.81)
RDW: 17.1 % — ABNORMAL HIGH (ref 11.5–15.5)
WBC: 6.4 10*3/uL (ref 4.0–10.5)
nRBC: 0 % (ref 0.0–0.2)

## 2020-06-25 LAB — BASIC METABOLIC PANEL
Anion gap: 8 (ref 5–15)
BUN: 75 mg/dL — ABNORMAL HIGH (ref 8–23)
CO2: 23 mmol/L (ref 22–32)
Calcium: 8.8 mg/dL — ABNORMAL LOW (ref 8.9–10.3)
Chloride: 102 mmol/L (ref 98–111)
Creatinine, Ser: 2.5 mg/dL — ABNORMAL HIGH (ref 0.61–1.24)
GFR, Estimated: 27 mL/min — ABNORMAL LOW (ref >60)
Glucose, Bld: 176 mg/dL — ABNORMAL HIGH (ref 70–99)
Potassium: 4.8 mmol/L (ref 3.5–5.1)
Sodium: 133 mmol/L — ABNORMAL LOW (ref 135–145)

## 2020-06-25 LAB — RESP PANEL BY RT-PCR (FLU A&B, COVID) ARPGX2
Influenza A by PCR: NEGATIVE
Influenza B by PCR: NEGATIVE
SARS Coronavirus 2 by RT PCR: NEGATIVE

## 2020-06-25 LAB — GLUCOSE, CAPILLARY
Glucose-Capillary: 146 mg/dL — ABNORMAL HIGH (ref 70–99)
Glucose-Capillary: 154 mg/dL — ABNORMAL HIGH (ref 70–99)
Glucose-Capillary: 196 mg/dL — ABNORMAL HIGH (ref 70–99)

## 2020-06-25 LAB — PROTIME-INR
INR: 1.9 — ABNORMAL HIGH (ref 0.8–1.2)
Prothrombin Time: 22.1 seconds — ABNORMAL HIGH (ref 11.4–15.2)

## 2020-06-25 MED ORDER — WARFARIN SODIUM 5 MG PO TABS: 5.0000 mg | ORAL_TABLET | ORAL | Status: DC

## 2020-06-25 MED ORDER — BENZONATATE 100 MG PO CAPS: 100.0000 mg | ORAL_CAPSULE | Freq: Two times a day (BID) | ORAL | Status: DC | PRN

## 2020-06-25 MED ORDER — WARFARIN SODIUM 7.5 MG PO TABS
7.5000 mg | ORAL_TABLET | Freq: Once | ORAL | Status: AC
  Administered 2020-06-25: 7.5 mg via ORAL
  Filled 2020-06-25: qty 1

## 2020-06-25 NOTE — Progress Notes (Signed)
ANTICOAGULATION CONSULT NOTE   Pharmacy Consult for warfarin Indication: atrial fibrillation  Allergies  Allergen Reactions   Codeine     Headache     Patient Measurements: Height: 5' 3"  (160 cm) Weight: 125.2 kg (276 lb) IBW/kg (Calculated) : 56.9  Vital Signs: Temp: 98.2 F (36.8 C) (06/23 0409) Temp Source: Oral (06/23 0409) BP: 124/93 (06/23 0409)  Labs: Recent Labs    06/23/20 0151 06/24/20 0348 06/25/20 0157  HGB 10.6* 11.0* 10.3*  HCT 35.8* 37.7* 34.7*  PLT 120* 108* 113*  LABPROT 28.1* 27.0* 22.1*  INR 2.6* 2.5* 1.9*  CREATININE 2.27*  --  2.50*     Estimated Creatinine Clearance: 33.7 mL/min (A) (by C-G formula based on SCr of 2.5 mg/dL (H)).   Medical History: Past Medical History:  Diagnosis Date   CAD (coronary artery disease)    LHC 2/08:  mRCA 50%, o/w no sig CAD, EF 25%   Cardiomyopathy ([place])    EF 35-40% in 2/16 in setting of AF with RVR >> echo 5/16 with improved LVF with EF 65-70%   Chronic atrial fibrillation (HCC)    Chronic diastolic heart failure (HCC)    HFPF - EF ~65-70% (OFTEN EXACERBATED BY AFIB)   CKD (chronic kidney disease)    hx of worsening renal failure and hyperK+ in setting of acute diast CHF >> required CVVHD in 4/16 and dialysis x 1 in 5/16   CKD stage 3 due to type 2 diabetes mellitus ([place]) 09/17/2015   Colon cancer ([place])    Colon polyps 09/21/2012   Tubular adenoma   Diabetes ([place])    Hyperlipidemia    Hypertension    Obesity hypoventilation syndrome (HCC)    Renal insufficiency    Sleep apnea    Assessment: 70 year old male with history of afib on chronic coumadin. Warfarin dose not appear to have been given this admission, last dose 6/16.   INR down to 1.9 this morning. Will transition back to home dose tomorrow. Will give extra today.   Prior to admit warfarin regimen is 76m every day except 7.5 on mondays. Hgb has been stable 10-11s. Plt low but stable at 108.   Goal of Therapy:  INR 2-3 Monitor  platelets by anticoagulation protocol: Yes   Plan:  Warfarin 7.545mtonight if still here Continue to follow daily INRs  FrErin HearingharmD., BCPS Clinical Pharmacist 06/25/2020 2:01 PM

## 2020-06-25 NOTE — Progress Notes (Signed)
NURSING PROGRESS NOTE  AZARIAN STARACE [id number] Discharge Data: 06/25/2020 7:16 PM Attending Provider: Patrecia Pour, MD [id number], [name], MD     [name] to be D/C'd Skilled nursing facility per MD order.  Discussed with the patient the After Visit Summary and all questions fully answered. All IV's discontinued with no bleeding noted. All belongings returned to patient for patient to take home. Report given to [facility] Will get nighshift nurse to call when pt is leaving the hospital.  Last Vital Signs:  Blood pressure (!) 124/93, pulse 92, temperature 98.2 F (36.8 C), temperature source Oral, resp. rate 20, height 5' 3"  (1.6 m), weight 125.2 kg, SpO2 93 %.  Discharge Medication List Allergies as of 06/25/2020       Reactions   Codeine    Headache        Medication List     TAKE these medications    acetaminophen 325 MG tablet Commonly known as: TYLENOL Take 2 tablets (650 mg total) by mouth every 6 (six) hours as needed for mild pain (or Fever >/= 101).   allopurinol 300 MG tablet Commonly known as: ZYLOPRIM Take 1 tablet (300 mg total) by mouth daily.   atorvastatin 20 MG tablet Commonly known as: LIPITOR TAKE 1 TABLET BY MOUTH EVERY DAY   benzonatate 100 MG capsule Commonly known as: TESSALON Take 1 capsule (100 mg total) by mouth 2 (two) times daily as needed for cough.   Contour Next Test test strip Generic drug: glucose blood Test BS TID Dx E11.29   Farxiga 5 MG Tabs tablet Generic drug: dapagliflozin propanediol Take 5 mg by mouth daily.   ferrous sulfate 325 (65 FE) MG tablet Take 325 mg by mouth daily with breakfast.   Insulin Pen Needle 32G X 4 MM Misc Use to give insulin daily Dx E11.29   Klor-Con M20 20 MEQ tablet Generic drug: potassium chloride SA TAKE 1/2 TABLET (10 MEQ TOTAL) BY MOUTH DAILY. NEED OFFICE VISIT What changed: See the new instructions.   losartan 25 MG tablet Commonly known as: COZAAR TAKE 1 TABLET BY MOUTH  EVERY DAY   metoprolol succinate 50 MG 24 hr tablet Commonly known as: TOPROL-XL TAKE 1 TABLET (50 MG TOTAL) BY MOUTH DAILY. TAKE WITH OR IMMEDIATELY FOLLOWING A MEAL. What changed: additional instructions   NovoLOG FlexPen 100 UNIT/ML FlexPen Generic drug: insulin aspart insulin aspart (novoLOG) injection 0-9 Units 0-9 Units, Subcutaneous, 3 times daily with meals, CBG < 70: Implement Hypoglycemia Standing Orders and refer to Hypoglycemia Standing Orders sidebar report CBG 70 - 120: 0 units CBG 121 - 150: 1 unit CBG 151 - 200: 2 units CBG 201 - 250: 3 units CBG 251 - 300: 5 units CBG 301 - 350: 7 units CBG 351 - 400: 9 units CBG > 400 What changed:  how much to take how to take this when to take this additional instructions   pantoprazole 40 MG tablet Commonly known as: PROTONIX TAKE 1 TABLET BY MOUTH EVERY DAY   polyethylene glycol 17 g packet Commonly known as: MIRALAX / GLYCOLAX Take 17 g by mouth daily. What changed:  when to take this reasons to take this   sildenafil 20 MG tablet Commonly known as: REVATIO Take 1 tablet (20 mg total) by mouth 3 (three) times daily.   torsemide 20 MG tablet Commonly known as: DEMADEX Take 3 tablets (60 mg total) by mouth in the morning AND 2 tablets (40 mg total) every evening.  Tyler Aas FlexTouch 100 UNIT/ML FlexTouch Pen Generic drug: insulin degludec Inject 24 Units into the skin at bedtime.   Trulicity 1.5 YD/3.7JF Sopn Generic drug: Dulaglutide Inject 1.5 mg into the skin once a week. What changed: when to take this   warfarin 5 MG tablet Commonly known as: COUMADIN Take as directed. If you are unsure how to take this medication, talk to your nurse or doctor. Original instructions: Take 1-1.5 tablets (5-7.5 mg total) by mouth See admin instructions. Take 1 1/2 tablets (7.5 mg) by mouth on Monday evening, take 1 tablet (5 mg) on all other nights of the week

## 2020-06-25 NOTE — Care Management Important Message (Signed)
Important Message  Patient Details  Name: William Flores MRN: [id number] Date of Birth: August 22, 1950   Medicare Important Message Given:  Yes     Orbie Pyo 06/25/2020, 2:45 PM

## 2020-06-25 NOTE — TOC Transition Note (Signed)
Transition of Care [facility]) - CM/SW Discharge Note   Patient Details  Name: William Flores MRN: [id number] Date of Birth: [birth year]  Transition of Care [facility]) CM/SW Contact:  Bethann Berkshire, [place] Phone Number: 06/25/2020, 2:26 PM   Clinical Narrative:     Patient will DC to: [facility] Anticipated DC date: 06/25/20 Family notified: Left message with   Faye Ramsay (Daughter)  [phone] (Mobile)   Transport by: Corey Harold   Per MD patient ready for DC to [facility]. RN, patient, patient's family, and facility notified of DC. Discharge Summary and FL2 sent to facility. RN to call report prior to discharge [phone] Room 151). DC packet on chart. Ambulance transport requested for patient.   CSW will sign off for now as social work intervention is no longer needed. Please consult [place] again if new needs arise.   Final next level of care: Skilled Nursing Facility Barriers to Discharge: No Barriers Identified   Patient Goals and CMS Choice Patient states their goals for this hospitalization and ongoing recovery are:: to go to SNF CMS Medicare.gov Compare Post Acute Care list provided to:: Patient Choice offered to / list presented to : Patient  Discharge Placement              Patient chooses bed at:  [facility]) Patient to be transferred to facility by: [place] Name of family member notified: Faye Ramsay (Daughter)   [phone] (Mobile)     Left message with daughter Patient and family notified of of transfer: 06/25/20  Discharge Plan and Services In-house Referral: Clinical Social Work                                   Social Determinants of Health ([place]) Interventions     Readmission Risk Interventions Readmission Risk Prevention Plan 04/15/2019  Transportation Screening Complete  PCP or Specialist Appt within 3-5 Days Complete  HRI or [place] Complete  Social Work Consult for [place] Planning/Counseling Complete   Palliative Care Screening Not Applicable  Medication Review Press photographer) Complete  Some recent data might be hidden

## 2020-06-25 NOTE — Plan of Care (Signed)
  Problem: Clinical Measurements: Goal: Ability to maintain clinical measurements within normal limits will improve Outcome: Progressing   Problem: Clinical Measurements: Goal: Will remain free from infection Outcome: Progressing   

## 2020-06-25 NOTE — Progress Notes (Signed)
Physical Therapy Treatment Patient Details Name: William Flores MRN: [id number] DOB: February 27, 1950 Today's Date: 06/25/2020    History of Present Illness Pt is a 70 y.o. male admitted 06/19/20 after syncopal episode vs. cardiac arrest in lobby of MD office; code was called, ROSC achieved in the office, no shockable rhythm detected. Transfer to [facility] with c/o chest pain. CXR with L pleural effusion. Echo shows worsening RV failure, pulmonary HTN. Pt also with acute on chronic (unexplained) anemia, suspect due to GIB with +FOBT in setting of supratherapeutic INR. Plan for enteroscopy 6/22. PMH includes CHF, DM, CAD, afib, OSA, colon CA.    PT Comments    PT remains limited by chest soreness and fatigue. Pt reports difficulty with SOB due to pain with inhalation and will benefit from use of incentive spirometer to improve tidal volume and inspiratory capacity. Pt continues to demonstrate LE weakness and difficulty transferring without physical assistance, benefiting from PT cues to rock for momentum. Pt will benefit from continued aggressive mobilization and PT POC to improve mobility quality and restore independence. PT recommends SNF placement at the time of discharge.   Follow Up Recommendations  SNF;Supervision - Intermittent     Equipment Recommendations  None recommended by PT    Recommendations for Other Services       Precautions / Restrictions Precautions Precautions: Fall Restrictions Weight Bearing Restrictions: No    Mobility  Bed Mobility               General bed mobility comments: not assessed, pt received and left in recliner    Transfers Overall transfer level: Needs assistance Equipment used: Rolling walker (2 wheeled) Transfers: Sit to/from Stand Sit to Stand: Min assist         General transfer comment: PT assist to power up into standing, cues to rock for momentum in futur transfer attempts  Ambulation/Gait Ambulation/Gait assistance: Min guard Gait  Distance (Feet): 35 Feet Assistive device: Rolling walker (2 wheeled) Gait Pattern/deviations: Step-through pattern Gait velocity: reduced Gait velocity interpretation: <1.8 ft/sec, indicate of risk for recurrent falls General Gait Details: pt with slowed step-through gait, activity tolerance limited by fatigue and discomfort   Stairs             Wheelchair Mobility    Modified Rankin (Stroke Patients Only)       Balance Overall balance assessment: Needs assistance Sitting-balance support: No upper extremity supported;Feet supported Sitting balance-Leahy Scale: Fair     Standing balance support: No upper extremity supported Standing balance-Leahy Scale: Fair                              Cognition Arousal/Alertness: Awake/alert Behavior During Therapy: WFL for tasks assessed/performed Overall Cognitive Status: Within Functional Limits for tasks assessed                                        Exercises      General Comments General comments (skin integrity, edema, etc.): pt on 4L [place] with desat to 91% when mobilizing, recovers quickly with seated rest. HR stable in 90s with activity at this time.      Pertinent Vitals/Pain Pain Assessment: Faces Faces Pain Scale: Hurts even more Pain Location: ribs Pain Descriptors / Indicators: Aching Pain Intervention(s): Monitored during session    Home Living  Prior Function            PT Goals (current goals can now be found in the care plan section) Acute Rehab PT Goals Patient Stated Goal: decr soreness Progress towards PT goals: Progressing toward goals    Frequency    Min 3X/week      PT Plan Current plan remains appropriate    Co-evaluation              AM-PAC PT "6 Clicks" Mobility   Outcome Measure  Help needed turning from your back to your side while in a flat bed without using bedrails?: A Little Help needed moving from lying on  your back to sitting on the side of a flat bed without using bedrails?: A Lot Help needed moving to and from a bed to a chair (including a wheelchair)?: A Little Help needed standing up from a chair using your arms (e.g., wheelchair or bedside chair)?: A Little Help needed to walk in hospital room?: A Little Help needed climbing 3-5 steps with a railing? : A Lot 6 Click Score: 16    End of Session Equipment Utilized During Treatment: Oxygen Activity Tolerance: Patient limited by fatigue;Patient limited by pain Patient left: in chair;with call bell/phone within reach Nurse Communication: Mobility status PT Visit Diagnosis: Other abnormalities of gait and mobility (R26.89);Pain Pain - part of body:  (chest)     Time: 3244-0102 PT Time Calculation (min) (ACUTE ONLY): 12 min  Charges:  $Therapeutic Activity: 8-22 mins                     Zenaida Niece, PT, DPT Acute Rehabilitation Pager: [phone]    Zenaida Niece 06/25/2020, 4:13 PM

## 2020-06-25 NOTE — Discharge Summary (Signed)
Physician Discharge Summary  WOODFIN KISS YQI:[id number] DOB: 1950/12/01 DOA: 06/19/2020  PCP: [name], [name], MD  Admit date: 06/19/2020 Discharge date: 06/25/2020  Admitted From: Home Disposition: SNF   Recommendations for Outpatient Follow-up:  Follow up with PCP in 1-2 weeks Follow up with GI for continued evaluation of presumed GI bleeding. Needs perfusion scan to investigate worsening RV failure (unable to tolerate laying flat due to MSK chest pain while admitted), continue coumadin and monitor INR closely. Cardiology to arrange outpatient stress testing and cardiac monitoring (I contacted them on the day of discharge)  Home Health: NA Equipment/Devices: Per SNF Discharge Condition: Stable CODE STATUS: Full Diet recommendation: Heart healthy, carb-modified  Brief/Interim Summary: William Flores is a 70 y.o. male with a history of chronic 2L O2-dependent respiratory failure, IDA on iron supplement, colon CA s/p resection 2002, chronic atrial fibrillation on coumadin, CKD stage III, T2DM, HTN, HLD, OSA, chronic HFpEF who presented to the ED 6/17 from his PCP's office after an episode of syncope vs. cardiac arrest. Code was called, ROSC achieved in the office, no shockable rhythm detected. He was transported to [facility] where he complained of chest pain over the sternum. SCr 1.77, BUN 39, INR 3.6, hgb 9g/dl (baseline ~12), +FOBT, platelets 86k, ferritin 48, iron 36, WBC normal. Cardiology has recommended outpatient stress testing and cardiac monitoring and perfusion scan (this is limited by patient's continued chest discomfort at this time). GI performed repeat endoscopy with enteroscopy revealing only known gastropathy without evidence of hemorrhage. Further work up was recommended as an outpatient as his hemoglobin has settled and he has no gross bleeding. He will require rehabilitation at a SNF which is pursued.  Syncope vs. cardiac arrest: Echo shows worsening RV failure, pulmonary HTN,  but no regional WMA and low-normal preserved LVEF. Mild troponin elevation due to demand ischemia. - Cardiology planning nuclear stress testing as outpatient as well as 30-day event monitor. - Cardiology signed off, recommending to continue home medications   Acute blood loss anemia on chronic (unexplained) iron deficiency anemia: Suspect to be due to GI bleeding with +FOBT in setting of supratherapeutic INR. - Enteroscopy 6/22 showed known/previously biopsied gastritis and otherwise normal mucosa to the jejunum.  - GI feels colonoscopy not required and VCE may not be tolerated due to box on chest.  - Empiric PPI - Continue po iron. - Hgb is 10.3g/dl, the same as on presentation without need for transfusion at this time and no gross bleeding.   Chronic atrial fibrillation, PVCs: - Continue coumadin, monitor INR closely   Pulmonary HTN, RV failure, chronic HFpEF and history of chronic combined HFrEF (tachycardia-mediated and improved on recent echo): - Cardiology recommended restarting home medications at discharge. - Check perfusion scan given worsening RV function/pulm HTN.   AKI on stage IIIa CKD: Hx of requiring CVVHD for AKI. - Hold losartan, avoid hypotension. Renal US consistent with chronic disease, bilateral parenchymal thinning, no hydro. SCr above baseline, FENa 0.3%, so gave limited IVF.  stop these and await nephrology recommendations regarding diuretic reinitiation.   T2DM: HbA1c 8.2% - Continue home medications   Hypokalemia: Supplemented, recommend monitoring.   HLD: - Continue statin   Thrombocytopenia: Chronic   Chronic hypoxic respiratory failure, OSA: At baseline. - Continue supplemental oxygen. Pt declining CPAP while hospitalized.    Obesity: Estimated body mass index is 47.83 kg/m as calculated from the following:   Height as of this encounter: 5' 3"  (1.6 m).   Weight as of this encounter:  122.5 kg.  Discharge Diagnoses:  Active Problems:   Hypertensive  heart disease   History of colon cancer   Morbid obesity ([place])   Right heart failure (HCC)   OSA (obstructive sleep apnea)   Nonischemic cardiomyopathy (HCC)   Anticoagulated on Coumadin   Atrial fibrillation with slow ventricular response (HCC)   DM (diabetes mellitus), type 2, uncontrolled, with renal complications (HCC)   Normocytic anemia   CKD stage 3 due to type 2 diabetes mellitus ([place])   Hyperlipidemia associated with type 2 diabetes mellitus ([place])   Pulmonary hypertension ([place])   Syncope   Hypokalemia   Fecal occult blood test positive   AKI (acute kidney injury) ([place])   Syncope and collapse   Supratherapeutic INR   Chronic gastritis without bleeding  Discharge Instructions  Allergies as of 06/25/2020       Reactions   Codeine    Headache        Medication List     TAKE these medications    acetaminophen 325 MG tablet Commonly known as: TYLENOL Take 2 tablets (650 mg total) by mouth every 6 (six) hours as needed for mild pain (or Fever >/= 101).   allopurinol 300 MG tablet Commonly known as: ZYLOPRIM Take 1 tablet (300 mg total) by mouth daily.   atorvastatin 20 MG tablet Commonly known as: LIPITOR TAKE 1 TABLET BY MOUTH EVERY DAY   benzonatate 100 MG capsule Commonly known as: TESSALON Take 1 capsule (100 mg total) by mouth 2 (two) times daily as needed for cough.   Contour Next Test test strip Generic drug: glucose blood Test BS TID Dx E11.29   Farxiga 5 MG Tabs tablet Generic drug: dapagliflozin propanediol Take 5 mg by mouth daily.   ferrous sulfate 325 (65 FE) MG tablet Take 325 mg by mouth daily with breakfast.   Insulin Pen Needle 32G X 4 MM Misc Use to give insulin daily Dx E11.29   Klor-Con M20 20 MEQ tablet Generic drug: potassium chloride SA TAKE 1/2 TABLET (10 MEQ TOTAL) BY MOUTH DAILY. NEED OFFICE VISIT What changed: See the new instructions.   losartan 25 MG tablet Commonly known as: COZAAR TAKE 1 TABLET BY MOUTH EVERY  DAY   metoprolol succinate 50 MG 24 hr tablet Commonly known as: TOPROL-XL TAKE 1 TABLET (50 MG TOTAL) BY MOUTH DAILY. TAKE WITH OR IMMEDIATELY FOLLOWING A MEAL. What changed: additional instructions   NovoLOG FlexPen 100 UNIT/ML FlexPen Generic drug: insulin aspart insulin aspart (novoLOG) injection 0-9 Units 0-9 Units, Subcutaneous, 3 times daily with meals, CBG < 70: Implement Hypoglycemia Standing Orders and refer to Hypoglycemia Standing Orders sidebar report CBG 70 - 120: 0 units CBG 121 - 150: 1 unit CBG 151 - 200: 2 units CBG 201 - 250: 3 units CBG 251 - 300: 5 units CBG 301 - 350: 7 units CBG 351 - 400: 9 units CBG > 400 What changed:  how much to take how to take this when to take this additional instructions   pantoprazole 40 MG tablet Commonly known as: PROTONIX TAKE 1 TABLET BY MOUTH EVERY DAY   polyethylene glycol 17 g packet Commonly known as: MIRALAX / GLYCOLAX Take 17 g by mouth daily. What changed:  when to take this reasons to take this   sildenafil 20 MG tablet Commonly known as: REVATIO Take 1 tablet (20 mg total) by mouth 3 (three) times daily.   torsemide 20 MG tablet Commonly known as: DEMADEX Take 3 tablets (60 mg  total) by mouth in the morning AND 2 tablets (40 mg total) every evening.   Tyler Aas FlexTouch 100 UNIT/ML FlexTouch Pen Generic drug: insulin degludec Inject 24 Units into the skin at bedtime.   Trulicity 1.5 GG/8.3MO Sopn Generic drug: Dulaglutide Inject 1.5 mg into the skin once a week. What changed: when to take this   warfarin 5 MG tablet Commonly known as: COUMADIN Take as directed. If you are unsure how to take this medication, talk to your nurse or doctor. Original instructions: Take 1-1.5 tablets (5-7.5 mg total) by mouth See admin instructions. Take 1 1/2 tablets (7.5 mg) by mouth on Monday evening, take 1 tablet (5 mg) on all other nights of the week        Follow-up Information     Larey Dresser, MD Follow up.    Specialty: Cardiology Why: office will call with date & time for appointment, stress test and monitor set up Contact information: [place] [address] [phone]         [name], [name], MD Follow up.   Specialties: Family Medicine, Cardiology Contact information: [place] [address] [phone]                Allergies  Allergen Reactions   Codeine     Headache     Consultations: GI Cardiology  Procedures/Studies: US RENAL  Result Date: 06/21/2020 CLINICAL DATA:  Acute kidney injury. Patient with history of chronic kidney disease. EXAM: RENAL / URINARY TRACT ULTRASOUND COMPLETE COMPARISON:  Noncontrast abdominal CT 12/26/2018 FINDINGS: Right Kidney: Renal measurements: 13.6 x 7.1 x 7.3 cm = volume: 360 mL. Lobulated renal contours with mild parenchymal thinning. No hydronephrosis. No evidence of stone or focal renal lesion. Left Kidney: Renal measurements: 12.8 x 8.0 x 7.5 cm = volume: 402 mL. Mild parenchymal thinning. No hydronephrosis. Technically limited evaluation due to habitus and patient's difficulty with breath hold. No obvious focal lesion. Bladder: Appears normal for degree of bladder distention. Other: None. IMPRESSION: 1. No hydronephrosis. 2. Bilateral renal parenchymal thinning. Technically limited evaluation, particularly of the left kidney, due to habitus. Electronically Signed   By: Keith Rake M.D.   On: 06/21/2020 15:03   DG CHEST PORT 1 VIEW  Result Date: 06/24/2020 CLINICAL DATA:  Chest pain, history of recent CPR EXAM: PORTABLE CHEST 1 VIEW COMPARISON:  06/22/2020 FINDINGS: Cardiac shadow is again enlarged. Aortic calcifications are again seen. Pleural thickening is again noted on the right. No discrete bony fracture is seen. Stable central vascular congestion is noted. IMPRESSION: Stable appearance of the chest when compared with the previous exam. No definitive rib fractures are seen. Electronically  Signed   By: Inez Catalina M.D.   On: 06/24/2020 19:00   DG Chest Port 1 View  Result Date: 06/22/2020 CLINICAL DATA:  Preop evaluation EXAM: PORTABLE CHEST 1 VIEW COMPARISON:  06/19/2020 FINDINGS: Cardiac shadow remains enlarged. Aortic calcifications are again seen. Lungs are well aerated bilaterally. Pleural thickening is noted on the right likely related to fluid. Mild vascular congestion is seen stable in appearance from the prior exam. Stable left effusion is noted. IMPRESSION: New pleural thickening laterally on the right likely related to pleural fluid. The remainder of the exam is stable with CHF and left-sided effusion. Electronically Signed   By: Inez Catalina M.D.   On: 06/22/2020 17:19   DG Chest Port 1 View  Result Date: 06/19/2020 CLINICAL DATA:  Unresponsive chest pain EXAM: PORTABLE CHEST 1  VIEW COMPARISON:  04/10/2019 FINDINGS: Cardiomegaly with vascular congestion and mild interstitial edema. Probable small left effusion. Aortic atherosclerosis. No pneumothorax. IMPRESSION: Cardiomegaly with vascular congestion, mild interstitial pulmonary edema and probable small left effusion Electronically Signed   By: Donavan Foil M.D.   On: 06/19/2020 16:25   ECHOCARDIOGRAM COMPLETE  Result Date: 06/21/2020    ECHOCARDIOGRAM REPORT   Patient Name:   William Flores Date of Exam: 06/21/2020 Medical Rec #:  [id number]      Height:       63.0 in Accession #:    [id number]     Weight:       272.0 lb Date of Birth:  02-28-1950      BSA:          2.204 m Patient Age:    58 years       BP:           115/80 mmHg Patient Gender: M              HR:           100 bpm. Exam Location:  Inpatient Procedure: 2D Echo, Cardiac Doppler, Color Doppler and Intracardiac            Opacification Agent Indications:    R55 Syncope  History:        Patient has prior history of Echocardiogram examinations, most                 recent 04/01/2019. Cardiomyopathy and CHF, CAD, Arrythmias:Atrial                 Fibrillation; Risk  Factors:Diabetes, Dyslipidemia and                 Hypertension.  Sonographer:    Bernadene Person RDCS Referring Phys: [id number] Nicolette Bang  Sonographer Comments: Patient is morbidly obese. Image acquisition challenging due to patient body habitus and Image acquisition challenging due to respiratory motion. IMPRESSIONS  1. Technically difficult study despite Definity contrast  2. Left ventricular ejection fraction, by estimation, is 50 to 55%. The left ventricle has low normal function. Left ventricular endocardial border not optimally defined to evaluate regional wall motion. There is mild left ventricular hypertrophy. Left ventricular diastolic function could not be evaluated. There is the interventricular septum is flattened in systole and diastole, consistent with right ventricular pressure and volume overload.  3. Right ventricular systolic function is severely reduced. The right ventricular size is severely enlarged. There is severely elevated pulmonary artery systolic pressure. The estimated right ventricular systolic pressure is 98.9 mmHg.  4. The mitral valve is abnormal. Mild mitral valve regurgitation.  5. The aortic valve is tricuspid. Aortic valve regurgitation is not visualized. Mild aortic valve sclerosis is present, with no evidence of aortic valve stenosis.  6. The inferior vena cava is dilated in size with <50% respiratory variability, suggesting right atrial pressure of 15 mmHg. Comparison(s): Changes from prior study are noted. 04/01/2019: LVEF 55-60%, severe RV dysfunction and dilitation - RVSP 76 mmHg. FINDINGS  Left Ventricle: Left ventricular ejection fraction, by estimation, is 50 to 55%. The left ventricle has low normal function. Left ventricular endocardial border not optimally defined to evaluate regional wall motion. Definity contrast agent was given IV  to delineate the left ventricular endocardial borders. The left ventricular internal cavity size was normal in size. There  is mild left ventricular hypertrophy. The interventricular septum is flattened in systole and diastole, consistent with right ventricular pressure and  volume overload. Left ventricular diastolic function could not be evaluated due to atrial fibrillation. Left ventricular diastolic function could not be evaluated. Right Ventricle: The right ventricular size is severely enlarged. Right vetricular wall thickness was not well visualized. Right ventricular systolic function is severely reduced. There is severely elevated pulmonary artery systolic pressure. The tricuspid regurgitant velocity is 4.34 m/s, and with an assumed right atrial pressure of 15 mmHg, the estimated right ventricular systolic pressure is 29.1 mmHg. Left Atrium: Left atrial size was normal in size. Right Atrium: Right atrial size was normal in size. Pericardium: There is no evidence of pericardial effusion. Mitral Valve: The mitral valve is abnormal. There is mild thickening of the mitral valve leaflet(s). There is moderate calcification of the posterior mitral valve leaflet(s). Mild mitral annular calcification. Mild mitral valve regurgitation. Tricuspid Valve: The tricuspid valve is not well visualized. Tricuspid valve regurgitation is trivial. Aortic Valve: The aortic valve is tricuspid. Aortic valve regurgitation is not visualized. Mild aortic valve sclerosis is present, with no evidence of aortic valve stenosis. Pulmonic Valve: The pulmonic valve was not well visualized. Pulmonic valve regurgitation is not visualized. Aorta: The aortic root and ascending aorta are structurally normal, with no evidence of dilitation. Venous: The inferior vena cava is dilated in size with less than 50% respiratory variability, suggesting right atrial pressure of 15 mmHg. IAS/Shunts: No atrial level shunt detected by color flow Doppler.  LEFT VENTRICLE PLAX 2D LVIDd:         4.90 cm LVIDs:         3.70 cm LV PW:         1.00 cm LV IVS:        1.20 cm LVOT diam:      2.10 cm LV SV:         64 LV SV Index:   29 LVOT Area:     3.46 cm  RIGHT VENTRICLE TAPSE (M-mode): 0.9 cm LEFT ATRIUM           Index       RIGHT ATRIUM           Index LA diam:      4.90 cm 2.22 cm/m  RA Area:     18.90 cm LA Vol (A2C): 61.7 ml 28.00 ml/m RA Volume:   47.50 ml  21.55 ml/m LA Vol (A4C): 63.8 ml 28.95 ml/m  AORTIC VALVE LVOT Vmax:   118.00 cm/s LVOT Vmean:  75.433 cm/s LVOT VTI:    0.184 m  AORTA Ao Root diam: 3.20 cm Ao Asc diam:  3.30 cm TRICUSPID VALVE TR Peak grad:   75.3 mmHg TR Vmax:        434.00 cm/s  SHUNTS Systemic VTI:  0.18 m Systemic Diam: 2.10 cm Lyman Bishop MD Electronically signed by Lyman Bishop MD Signature Date/Time: 06/21/2020/12:27:18 PM    Final      Subjective: Feels overall slightly better, chest pain is still constant, worse with any movement and prohibits laying flat. Amenable to continued PT. No bleeding noted. No dyspnea.   Discharge Exam: Vitals:   06/24/20 1930 06/25/20 0409  BP: 137/86 (!) 124/93  Pulse: 92   Resp: 19 20  Temp: 99 F (37.2 C) 98.2 F (36.8 C)  SpO2: 95% 93%   General: Pt is alert, awake, not in acute distress Cardiovascular: RRR, S1/S2 +, no rubs, no gallops Respiratory: CTA bilaterally, no wheezing, no rhonchi Abdominal: Soft, NT, ND, bowel sounds + Extremities: No edema, no cyanosis  Labs:  BNP (last 3 results) Recent Labs    06/19/20 1630  BNP 456.2*   Basic Metabolic Panel: Recent Labs  Lab 06/19/20 2316 06/20/20 0248 06/21/20 0222 06/22/20 0934 06/23/20 0151 06/25/20 0157  NA  --  140 139 137 137 133*  K  --  4.3 4.8 4.6 4.4 4.8  CL  --  108 106 107 103 102  CO2  --  22 25 25 22 23   GLUCOSE  --  166* 153* 123* 126* 176*  BUN  --  49* 54* 56* 57* 75*  CREATININE  --  2.23* 2.41* 2.38* 2.27* 2.50*  CALCIUM  --  9.2 9.1 9.0 9.1 8.8*  MG 2.1  --   --   --   --   --    Liver Function Tests: Recent Labs  Lab 06/19/20 1550 06/21/20 0222  AST 13* 16  ALT 11 10  ALKPHOS 49 63  BILITOT 0.7 0.9   PROT 4.7* 6.6  ALBUMIN 2.4* 3.2*   No results for input(s): LIPASE, AMYLASE in the last 168 hours. No results for input(s): AMMONIA in the last 168 hours. CBC: Recent Labs  Lab 06/21/20 0222 06/22/20 0934 06/23/20 0151 06/24/20 0348 06/25/20 0157  WBC 6.6 6.0 6.6 6.6 6.4  NEUTROABS 4.9 4.4 4.8 4.5 4.6  HGB 11.1* 10.7* 10.6* 11.0* 10.3*  HCT 36.6* 36.1* 35.8* 37.7* 34.7*  MCV 96.1 96.3 96.8 96.7 95.1  PLT 105* 126* 120* 108* 113*   Cardiac Enzymes: No results for input(s): CKTOTAL, CKMB, CKMBINDEX, TROPONINI in the last 168 hours. BNP: Invalid input(s): POCBNP CBG: Recent Labs  Lab 06/24/20 0745 06/24/20 1203 06/24/20 1723 06/24/20 2124 06/25/20 0816  GLUCAP 126* 99 145* 175* 146*   D-Dimer No results for input(s): DDIMER in the last 72 hours. Hgb A1c No results for input(s): HGBA1C in the last 72 hours. Lipid Profile No results for input(s): CHOL, HDL, LDLCALC, TRIG, CHOLHDL, LDLDIRECT in the last 72 hours. Thyroid function studies No results for input(s): TSH, T4TOTAL, T3FREE, THYROIDAB in the last 72 hours.  Invalid input(s): FREET3 Anemia work up No results for input(s): VITAMINB12, FOLATE, FERRITIN, TIBC, IRON, RETICCTPCT in the last 72 hours. Urinalysis    Component Value Date/Time   COLORURINE YELLOW 06/21/2020 1059   APPEARANCEUR CLEAR 06/21/2020 1059   LABSPEC 1.020 06/21/2020 1059   PHURINE 5.5 06/21/2020 1059   GLUCOSEU NEGATIVE 06/21/2020 1059   HGBUR LARGE (A) 06/21/2020 1059   BILIRUBINUR NEGATIVE 06/21/2020 1059   KETONESUR NEGATIVE 06/21/2020 1059   PROTEINUR 100 (A) 06/21/2020 1059   UROBILINOGEN 0.2 05/16/2014 0822   NITRITE NEGATIVE 06/21/2020 1059   LEUKOCYTESUR NEGATIVE 06/21/2020 1059    Microbiology Recent Results (from the past 240 hour(s))  SARS CORONAVIRUS 2 (TAT 6-24 HRS) Nasopharyngeal Urine, Clean Catch     Status: None   Collection Time: 06/19/20  6:38 PM   Specimen: Urine, Clean Catch; Nasopharyngeal  Result Value Ref  Range Status   SARS Coronavirus 2 NEGATIVE NEGATIVE Final    Comment: (NOTE) SARS-CoV-2 target nucleic acids are NOT DETECTED.  The SARS-CoV-2 RNA is generally detectable in upper and lower respiratory specimens during the acute phase of infection. Negative results do not preclude SARS-CoV-2 infection, do not rule out co-infections with other pathogens, and should not be used as the sole basis for treatment or other patient management decisions. Negative results must be combined with clinical observations, patient history, and epidemiological information. The expected result is Negative.  Fact Sheet for Patients: SugarRoll.be  Fact Sheet for Healthcare Providers: https://www.woods-mathews.com/  This test is not yet approved or cleared by the [place] FDA and  has been authorized for detection and/or diagnosis of SARS-CoV-2 by FDA under an Emergency Use Authorization (EUA). This EUA will remain  in effect (meaning this test can be used) for the duration of the COVID-19 declaration under Se ction 564(b)(1) of the Act, 21 U.S.C. section 360bbb-3(b)(1), unless the authorization is terminated or revoked sooner.  Performed at [facility], [place] [address], [place], [place] [address]     Time coordinating discharge: Approximately 40 minutes  Patrecia Pour, MD  [facility] 06/25/2020, 12:13 PM

## 2020-06-25 NOTE — TOC Progression Note (Addendum)
Transition of Care [facility]) - Progression Note    Patient Details  Name: DAICHI MORIS MRN: [id number] Date of Birth: 1950/04/01  Transition [facility]) CM/SW [place], [place] Phone Number: 06/25/2020, 11:32 AM  Clinical Narrative:     CSW met with pt regarding SNF choice. He chooses [facility] and explains he has been there in the past; 1-2 years ago. Pt reports he is covid vaccinated and boosted. He has a CPAP that his daughter [name] should be able to bring. Pt consents to [place] contacting [facility].   CSW called and confirmed with Mardene Celeste at [facility] that they can take pt today. She will reach out to [facility] or pt to discuss consents and admission. Pt will need a rapid covid test. DC summary needs to be in by 3pm.  CSW called and left message for [facility] requesting return call.   Expected Discharge Plan: Skilled Nursing Facility Barriers to Discharge: Continued Medical Work up  Expected Discharge Plan and Services Expected Discharge Plan: [place] In-house Referral: Clinical Social Work     Living arrangements for the past 2 months: Single Family Home Expected Discharge Date: 06/25/20                                     Social Determinants of Health (SDOH) Interventions    Readmission Risk Interventions Readmission Risk Prevention Plan 04/15/2019  Transportation Screening Complete  PCP or Specialist Appt within 3-5 Days Complete  HRI or [place] Complete  Social Work Consult for [place] Planning/Counseling Complete  Palliative Care Screening Not Applicable  Medication Review Press photographer) Complete  Some recent data might be hidden

## 2020-06-29 ENCOUNTER — Ambulatory Visit: Payer: Medicare Other | Admitting: Family Medicine

## 2020-06-29 DIAGNOSIS — G4730 Sleep apnea, unspecified: Secondary | ICD-10-CM | POA: Diagnosis not present

## 2020-06-29 DIAGNOSIS — I5020 Unspecified systolic (congestive) heart failure: Secondary | ICD-10-CM | POA: Diagnosis not present

## 2020-06-29 DIAGNOSIS — R531 Weakness: Secondary | ICD-10-CM | POA: Diagnosis not present

## 2020-07-01 ENCOUNTER — Telehealth (HOSPITAL_COMMUNITY): Payer: Self-pay | Admitting: *Deleted

## 2020-07-01 ENCOUNTER — Ambulatory Visit: Payer: Medicare Other | Admitting: Family Medicine

## 2020-07-01 NOTE — Telephone Encounter (Signed)
Left message on voicemail in reference to upcoming appointment scheduled for 07/08/20 Phone number given for a call back so details instructions can be given.  William Flores

## 2020-07-07 ENCOUNTER — Ambulatory Visit: Payer: Medicare Other

## 2020-07-07 NOTE — Progress Notes (Unsigned)
Appears patient had been discharged without having 14 day ZIO AT monitor applied at hospital. Patient enrolled for [employer] to mail a 14 day ZIO AT monitor to his home.

## 2020-07-08 ENCOUNTER — Encounter (HOSPITAL_COMMUNITY): Payer: Self-pay | Admitting: Physician Assistant

## 2020-07-08 ENCOUNTER — Encounter (HOSPITAL_COMMUNITY): Payer: Medicare Other

## 2020-07-13 NOTE — Telephone Encounter (Signed)
Opened in error

## 2020-07-14 ENCOUNTER — Telehealth (HOSPITAL_COMMUNITY): Payer: Self-pay | Admitting: Physician Assistant

## 2020-07-14 NOTE — Telephone Encounter (Signed)
Just an FYI. We have made several attempts to contact this patient including sending a letter to schedule or reschedule their Myoview. We will be removing the patient from the echo/nuc WQ.  07/08/20 NO SHOWED-MAILED LETTER [facility]        Thank you

## 2020-07-20 ENCOUNTER — Telehealth: Payer: Self-pay | Admitting: *Deleted

## 2020-07-20 NOTE — Telephone Encounter (Signed)
Fax received mdINR PT/INR self testing service Test date/time 07/10/20 101 pm INR 1.7

## 2020-07-20 NOTE — Telephone Encounter (Signed)
LMOVM to call us back about INR

## 2020-07-20 NOTE — Telephone Encounter (Signed)
I attempted to reach the patient at the listed number but this went to a voicemail.  I see that he recently had syncope and collapse.  He is treated with medication for atrial fibrillation.  Goal INR should be 2-3.  I am uncertain as to what his Coumadin regimen is and was calling to clarify this.  Please attempt to reach out to the patient again so that he may adjust his Coumadin appropriately.  Also, I saw that he had a drug monitoring encounter in the EMR from [facility].  Is he seeing [facility] cardiologist for INR checks?

## 2020-07-21 ENCOUNTER — Encounter (INDEPENDENT_AMBULATORY_CARE_PROVIDER_SITE_OTHER): Payer: Self-pay | Admitting: Gastroenterology

## 2020-07-21 ENCOUNTER — Other Ambulatory Visit: Payer: Self-pay

## 2020-07-21 ENCOUNTER — Ambulatory Visit (INDEPENDENT_AMBULATORY_CARE_PROVIDER_SITE_OTHER): Payer: Medicare Other | Admitting: Gastroenterology

## 2020-07-21 VITALS — BP 105/66 | HR 98 | Temp 99.7°F | Ht 64.0 in | Wt 257.0 lb

## 2020-07-21 DIAGNOSIS — R791 Abnormal coagulation profile: Secondary | ICD-10-CM | POA: Diagnosis not present

## 2020-07-21 DIAGNOSIS — D509 Iron deficiency anemia, unspecified: Secondary | ICD-10-CM | POA: Insufficient documentation

## 2020-07-21 NOTE — Patient Instructions (Addendum)
Perform blood workup Follow up with cardiologist for episode of syncope

## 2020-07-21 NOTE — Telephone Encounter (Signed)
Please obtain patient's current dose

## 2020-07-21 NOTE — Progress Notes (Signed)
William Flores, M.D. Gastroenterology & Hepatology [facility] [address] [place], [place] [address] Primary Care Physician: [name], [name], MD [place] [address]  Referring MD: PCP  Chief Complaint: Iron deficiency anemia  History of Present Illness: JABARI SWOVELAND is a 70 y.o. male With multiple comorbidities including coronary artery disease status post stent placement, cardiomyopathy with decreased ejection fraction of 35%, atrial fibrillation, CKD, history of colon cancer status post partial colectomy, diabetes, hyperlipidemia, hypertension, CKD, who comes to clinic for evaluation after recent hospitalization at [facility] where he was found to have worsening anemia.  The patient was admitted to [facility] on 06/20/2020 after he presented an episode of possible syncope versus cardiac arrest (had CPR only for 1 minute with subsequent ROSC and normal mental status).  At that time the patient was found to have severe anemia with a hemoglobin of 9.0.  He was also found to have a supratherapeutic INR 3.6 (patient is on Coumadin).  He had melena in stool and FOBT was positive.  Notably, he has a history of iron deficiency anemia in the past for which he was seen by Dr. Laural Golden at [facility], for which she received 3 units of PRBC to improve his hemoglobin.  At that time underwent the following investigations EGD 12/28/2018 : - Normal esophagus. - Z-line regular, 45 cm from the incisors. - Bilious gastric fluid. Fluid aspiration performed. - Nodular mucosa in the gastric body. Biopsied. - Normal duodenal bulb and second portion of the duodenum.   Colonoscopy 12/28/2018: - Preparation of the colon was fair. - The examined portion of the ileum was normal. - One small polyp at the hepatic flexure. Biopsied. - One 4 mm polyp at the hepatic flexure, removed with a cold snare. Complete resection. Polyp tissue not  retrieved. - Seven 7 to 16 mm polyps at the splenic flexure and in the transverse colon, removed with a hot snare. Complete resection. Partial retrieval. Clips (MR conditional) were placed. - Two polyps not removed becuase of hyperperistalsis. -Repeat colonoscopy in 6 months was recommended Biopsy Results: A. STOMACH, BIOPSY:  - Gastric oxyntic mucosa with mild chronic gastritis.  Fundic gland  polyp(s).  - Warthin-Starry stain is negative for Helicobacter pylori.   B. COLON, HEPATIC FLEXURE AND TRANSVERSE, POLYPECTOMY:  - Tubular adenoma, negative for high grade dysplasia (X multiple).  During his most recent hospitalization the patient underwent a  push enteroscopy for evaluation of his anemia.  No source for bleeding was found.  He was considered by Dr. Carlean Purl that since the patient had a heart monitor, he could interfere with a capsule endoscopy and given his multiple comorbidities he would not tolerate a bowel prep for colonoscopy.  The patient was discharged on iron supplementation.   After his most recent hospitalization, he has been using oxygen at home. Has noticed his stools were dark green but denies having any melena or hematochezia. Has not presented any more syncopal episodes, lightheadedness or dizziness. The patient denies having any nausea, vomiting, fever, chills, hematochezia, melena, hematemesis, abdominal distention, abdominal pain, diarrhea, jaundice, pruritus or weight loss.  Has not had any recent CBC checked.  Takes pantoprazole 40 mg every day, which partially controls his heartburn as spicy food can lead to recurrent heartburn episodes.  Last EGD: as above Last Colonoscopy:as above  Past Medical History: Past Medical History:  Diagnosis Date   CAD (coronary artery disease)    LHC 2/08:  mRCA 50%, o/w no sig CAD, EF 25%   Cardiomyopathy ([place])    EF 35-40% in 2/16 in setting of AF with RVR >> echo 5/16 with improved LVF with EF 65-70%   Chronic atrial  fibrillation (HCC)    Chronic diastolic heart failure (HCC)    HFPF - EF ~65-70% (OFTEN EXACERBATED BY AFIB)   CKD (chronic kidney disease)    hx of worsening renal failure and hyperK+ in setting of acute diast CHF >> required CVVHD in 4/16 and dialysis x 1 in 5/16   CKD stage 3 due to type 2 diabetes mellitus ([place]) 09/17/2015   Colon cancer ([place])    Colon polyps 09/21/2012   Tubular adenoma   Diabetes ([place])    Hyperlipidemia    Hypertension    Obesity hypoventilation syndrome ([place])    Renal insufficiency    Sleep apnea     Past Surgical History: Past Surgical History:  Procedure Laterality Date   CIRCUMCISION     COLON RESECTION     COLONOSCOPY N/A 09/21/2012   Procedure: COLONOSCOPY;  Surgeon: Inda Castle, MD;  Location: [facility] ENDOSCOPY;  Service: Endoscopy;  Laterality: N/A;   COLONOSCOPY N/A 12/28/2018   Procedure: COLONOSCOPY;  Surgeon: Rogene Houston, MD;  Location: [facility] ENDO SUITE;  Service: Endoscopy;  Laterality: N/A;   ENTEROSCOPY N/A 06/24/2020   Procedure: ENTEROSCOPY;  Surgeon: Gatha Mayer, MD;  Location: [facility] ENDOSCOPY;  Service: Endoscopy;  Laterality: N/A;   ESOPHAGOGASTRODUODENOSCOPY N/A 12/28/2018   Procedure: ESOPHAGOGASTRODUODENOSCOPY (EGD);  Surgeon: Rogene Houston, MD;  Location: [facility] ENDO SUITE;  Service: Endoscopy;  Laterality: N/A;   RIGHT HEART CATH N/A 04/11/2019   Procedure: RIGHT HEART CATH;  Surgeon: Larey Dresser, MD;  Location: [place] CV LAB;  Service: Cardiovascular;  Laterality: N/A;   US ECHOCARDIOGRAPHY  03/04/2010   abnormal study    Family History: Family History  Problem Relation Age of Onset   Breast cancer Mother    Diabetes Mother    Heart attack Father     Social History: Social History   Tobacco Use  Smoking Status Former   Types: Cigarettes   Quit date: 08/06/1983   Years since quitting: 36.9  Smokeless Tobacco Never   Social History   Substance and Sexual Activity  Alcohol Use No   Social History   Substance  and Sexual Activity  Drug Use No    Allergies: Allergies  Allergen Reactions   Codeine     Headache     Medications: Current Outpatient Medications  Medication Sig Dispense Refill   acetaminophen (TYLENOL) 325 MG tablet Take 2 tablets (650 mg total) by mouth every 6 (six) hours as needed for mild pain (or Fever >/= 101). 12 tablet 0   allopurinol (ZYLOPRIM) 300 MG tablet Take 1 tablet (300 mg total) by mouth daily. 90 tablet 3   benzonatate (TESSALON) 100 MG capsule Take 1 capsule (100 mg total) by mouth 2 (two) times daily as needed for cough.     Dulaglutide (TRULICITY) 1.5 RJ/1.8AC SOPN Inject 1.5 mg into the skin once a week. 6 mL 3   FARXIGA 5 MG TABS tablet Take 5 mg by mouth daily.     ferrous sulfate 325 (65 FE) MG tablet Take 325 mg by mouth daily with breakfast.      gentamicin (GARAMYCIN) 0.3 % ophthalmic ointment 3 (three) times daily.     KLOR-CON M20 20 MEQ tablet TAKE 1/2 TABLET (10 MEQ TOTAL) BY MOUTH DAILY.  NEED OFFICE VISIT 45 tablet 0   losartan (COZAAR) 25 MG tablet TAKE 1 TABLET BY MOUTH EVERY DAY 90 tablet 3   metoprolol succinate (TOPROL-XL) 50 MG 24 hr tablet TAKE 1 TABLET (50 MG TOTAL) BY MOUTH DAILY. TAKE WITH OR IMMEDIATELY FOLLOWING A MEAL. 90 tablet 0   pantoprazole (PROTONIX) 40 MG tablet TAKE 1 TABLET BY MOUTH EVERY DAY 90 tablet 1   polyethylene glycol (MIRALAX / GLYCOLAX) 17 g packet Take 17 g by mouth daily. 14 each 0   torsemide (DEMADEX) 20 MG tablet Take 3 tablets (60 mg total) by mouth in the morning AND 2 tablets (40 mg total) every evening. 280 tablet 0   warfarin (COUMADIN) 5 MG tablet Take 1-1.5 tablets (5-7.5 mg total) by mouth See admin instructions. Take 1 1/2 tablets (7.5 mg) by mouth on Monday evening, take 1 tablet (5 mg) on all other nights of the week     atorvastatin (LIPITOR) 20 MG tablet TAKE 1 TABLET BY MOUTH EVERY DAY 90 tablet 3   glucose blood (CONTOUR NEXT TEST) test strip Test BS TID Dx E11.29 300 strip 3   insulin aspart  (NOVOLOG FLEXPEN) 100 UNIT/ML FlexPen insulin aspart (novoLOG) injection 0-9 Units 0-9 Units, Subcutaneous, 3 times daily with meals, CBG < 70: Implement Hypoglycemia Standing Orders and refer to Hypoglycemia Standing Orders sidebar report CBG 70 - 120: 0 units CBG 121 - 150: 1 unit CBG 151 - 200: 2 units CBG 201 - 250: 3 units CBG 251 - 300: 5 units CBG 301 - 350: 7 units CBG 351 - 400: 9 units CBG > 400 15 mL 11   insulin degludec (TRESIBA FLEXTOUCH) 100 UNIT/ML FlexTouch Pen Inject 24 Units into the skin at bedtime. 45 mL 3   Insulin Pen Needle 32G X 4 MM MISC Use to give insulin daily Dx E11.29 100 each 3   sildenafil (REVATIO) 20 MG tablet Take 1 tablet (20 mg total) by mouth 3 (three) times daily. 90 tablet 6   No current facility-administered medications for this visit.    Review of Systems: GENERAL: negative for malaise, night sweats HEENT: No changes in hearing or vision, no nose bleeds or other nasal problems. NECK: Negative for lumps, goiter, pain and significant neck swelling RESPIRATORY: Negative for cough, wheezing CARDIOVASCULAR: Negative for chest pain, leg swelling, palpitations, orthopnea GI: SEE HPI MUSCULOSKELETAL: Negative for joint pain or swelling, back pain, and muscle pain. SKIN: Negative for lesions, rash PSYCH: Negative for sleep disturbance, mood disorder and recent psychosocial stressors. HEMATOLOGY Negative for prolonged bleeding, bruising easily, and swollen nodes. ENDOCRINE: Negative for cold or heat intolerance, polyuria, polydipsia and goiter. NEURO: negative for tremor, gait imbalance, syncope and seizures. The remainder of the review of systems is noncontributory.   Physical Exam: BP 105/66 (BP Location: Right Arm, Patient Position: Sitting, Cuff Size: Large)   Pulse 98   Temp 99.7 F (37.6 C) (Oral)   Ht 5' 4"  (1.626 m)   Wt 257 lb (116.6 kg)   BMI 44.11 kg/m  GENERAL: The patient is AO x3, in no acute distress. Sitting in wheelchair, uses  oxygen.. Obese. HEENT: Head is normocephalic and atraumatic. EOMI are intact. Mouth is well hydrated and without lesions. NECK: Supple. No masses LUNGS: Clear to auscultation. No presence of rhonchi/wheezing/rales. Adequate chest expansion HEART: RRR, normal s1 and s2. ABDOMEN: Soft, nontender, no guarding, no peritoneal signs, and nondistended. BS +. No masses. EXTREMITIES: Without any cyanosis, clubbing, rash, lesions or edema. NEUROLOGIC: AOx3,  no focal motor deficit. SKIN: no jaundice, no rashes   Imaging/Labs: as above  I personally reviewed and interpreted the available labs, imaging and endoscopic files.  Impression and Plan: VIRGILIO BROADHEAD is a 70 y.o. male With multiple comorbidities including coronary artery disease status post stent placement, cardiomyopathy with decreased ejection fraction of 35%, atrial fibrillation, CKD, history of colon cancer status post partial colectomy, diabetes, hyperlipidemia, hypertension, CKD, who comes to clinic for evaluation after recent hospitalization at [facility] where he was found to have worsening anemia.  The patient has significant drop in his hemoglobin at the time he presented his syncopal episode.  Drop was in the setting of a supratherapeutic INR which may have led to chronic losses of blood from anywhere in his gastrointestinal tract.  He had a push enteroscopy that did not show any lesions that could explain his chronic losses.  I had a thorough discussion with the patient regarding the importance of having his INR better control, he will need to follow with his cardiologist, who will also need to assess his syncopal episodes.  At this point, given his multiple comorbidities and poor clinical status, I will like to defer performing any colonoscopy for now.  If his medical status improves in the next 3 months we will consider performing this test.  For now he needs to continue taking his oral iron and will need to have his iron stores and CBC  rechecked, as well as an INR.  The patient understood and agreed.   -Check CBC, iron stores and INR -Follow up with cardiologist for episode of syncope -Continue oral iron supplementation -RTC 3 months  All questions were answered.      William Peppers, MD Gastroenterology and Hepatology [facility]

## 2020-07-21 NOTE — Telephone Encounter (Signed)
Spoke with pt's daughter. Pt is still in rehab at [facility] in [place]. He will probably be there until September.  [name],  Do you contact the nursing home for INR results and Coumadin dosage? Let me know if I need to get in touch with them.

## 2020-07-21 NOTE — Telephone Encounter (Signed)
LMOVM to call back to discuss mdINR machine since pt is at [facility] in [place]

## 2020-07-23 ENCOUNTER — Ambulatory Visit (HOSPITAL_COMMUNITY)
Admission: RE | Admit: 2020-07-23 | Discharge: 2020-07-23 | Disposition: A | Discharge status: Home / Self Care | Payer: Medicare Other | Source: Ambulatory Visit | Attending: Cardiology | Admitting: Cardiology

## 2020-07-23 ENCOUNTER — Telehealth (HOSPITAL_COMMUNITY): Payer: Self-pay | Admitting: *Deleted

## 2020-07-23 ENCOUNTER — Encounter (HOSPITAL_COMMUNITY): Payer: Self-pay | Admitting: Cardiology

## 2020-07-23 ENCOUNTER — Other Ambulatory Visit (HOSPITAL_COMMUNITY): Payer: Self-pay | Admitting: Cardiology

## 2020-07-23 ENCOUNTER — Other Ambulatory Visit: Payer: Self-pay

## 2020-07-23 VITALS — BP 102/70 | HR 74 | Wt 257.0 lb

## 2020-07-23 DIAGNOSIS — Z885 Allergy status to narcotic agent status: Secondary | ICD-10-CM | POA: Diagnosis not present

## 2020-07-23 DIAGNOSIS — I2729 Other secondary pulmonary hypertension: Secondary | ICD-10-CM | POA: Diagnosis not present

## 2020-07-23 DIAGNOSIS — Z79899 Other long term (current) drug therapy: Secondary | ICD-10-CM | POA: Diagnosis not present

## 2020-07-23 DIAGNOSIS — G4733 Obstructive sleep apnea (adult) (pediatric): Secondary | ICD-10-CM | POA: Insufficient documentation

## 2020-07-23 DIAGNOSIS — I272 Pulmonary hypertension, unspecified: Secondary | ICD-10-CM | POA: Diagnosis not present

## 2020-07-23 DIAGNOSIS — E669 Obesity, unspecified: Secondary | ICD-10-CM | POA: Insufficient documentation

## 2020-07-23 DIAGNOSIS — I130 Hypertensive heart and chronic kidney disease with heart failure and stage 1 through stage 4 chronic kidney disease, or unspecified chronic kidney disease: Secondary | ICD-10-CM | POA: Diagnosis not present

## 2020-07-23 DIAGNOSIS — Z85038 Personal history of other malignant neoplasm of large intestine: Secondary | ICD-10-CM | POA: Diagnosis not present

## 2020-07-23 DIAGNOSIS — I482 Chronic atrial fibrillation, unspecified: Secondary | ICD-10-CM | POA: Insufficient documentation

## 2020-07-23 DIAGNOSIS — N183 Chronic kidney disease, stage 3 unspecified: Secondary | ICD-10-CM | POA: Insufficient documentation

## 2020-07-23 DIAGNOSIS — Z8249 Family history of ischemic heart disease and other diseases of the circulatory system: Secondary | ICD-10-CM | POA: Insufficient documentation

## 2020-07-23 DIAGNOSIS — Z794 Long term (current) use of insulin: Secondary | ICD-10-CM | POA: Insufficient documentation

## 2020-07-23 DIAGNOSIS — E1122 Type 2 diabetes mellitus with diabetic chronic kidney disease: Secondary | ICD-10-CM | POA: Insufficient documentation

## 2020-07-23 DIAGNOSIS — I2781 Cor pulmonale (chronic): Secondary | ICD-10-CM | POA: Insufficient documentation

## 2020-07-23 DIAGNOSIS — I50812 Chronic right heart failure: Secondary | ICD-10-CM

## 2020-07-23 DIAGNOSIS — Z7901 Long term (current) use of anticoagulants: Secondary | ICD-10-CM | POA: Insufficient documentation

## 2020-07-23 DIAGNOSIS — R55 Syncope and collapse: Secondary | ICD-10-CM

## 2020-07-23 DIAGNOSIS — Z87891 Personal history of nicotine dependence: Secondary | ICD-10-CM | POA: Insufficient documentation

## 2020-07-23 LAB — BASIC METABOLIC PANEL
Anion gap: 8 (ref 5–15)
BUN: 55 mg/dL — ABNORMAL HIGH (ref 8–23)
CO2: 28 mmol/L (ref 22–32)
Calcium: 9.2 mg/dL (ref 8.9–10.3)
Chloride: 105 mmol/L (ref 98–111)
Creatinine, Ser: 2.18 mg/dL — ABNORMAL HIGH (ref 0.61–1.24)
GFR, Estimated: 32 mL/min — ABNORMAL LOW (ref >60)
Glucose, Bld: 116 mg/dL — ABNORMAL HIGH (ref 70–99)
Potassium: 3.9 mmol/L (ref 3.5–5.1)
Sodium: 141 mmol/L (ref 135–145)

## 2020-07-23 LAB — CBC
HCT: 35.4 % — ABNORMAL LOW (ref 39.0–52.0)
Hemoglobin: 10.3 g/dL — ABNORMAL LOW (ref 13.0–17.0)
MCH: 28 pg (ref 26.0–34.0)
MCHC: 29.1 g/dL — ABNORMAL LOW (ref 30.0–36.0)
MCV: 96.2 fL (ref 80.0–100.0)
Platelets: 93 10*3/uL — ABNORMAL LOW (ref 150–400)
RBC: 3.68 MIL/uL — ABNORMAL LOW (ref 4.22–5.81)
RDW: 18.6 % — ABNORMAL HIGH (ref 11.5–15.5)
WBC: 4 10*3/uL (ref 4.0–10.5)
nRBC: 0 % (ref 0.0–0.2)

## 2020-07-23 LAB — BRAIN NATRIURETIC PEPTIDE: B Natriuretic Peptide: 180.2 pg/mL — ABNORMAL HIGH (ref 0.0–100.0)

## 2020-07-23 MED ORDER — TORSEMIDE 20 MG PO TABS: 60.0000 mg | ORAL_TABLET | Freq: Every day | ORAL | 3 refills | Status: DC

## 2020-07-23 NOTE — Patient Instructions (Signed)
Labs done today. We will contact you only if your labs are abnormal.  STOP taking Losartan  INCREASE Torsemide to 51m (3 tablets) by mouth 2 times daily.   No other medication changes were made. Please continue all current medications as prescribed.  Please make sure you wear your CPAP machine nightly.  Your provider has recommended that  you wear a Live Zio Patch for 14 days.  This monitor will record your heart rhythm for our review.  IF you have any symptoms while wearing the monitor please press the button.  If you have any issues with the patch or you notice a red or orange light on it please call the company at [phone]  Once you remove the patch please mail it back to the company as soon as possible so we can get the results.  Your physician recommends that you schedule a follow-up appointment in: 3-4 weeks for an appointment with our [facility] here in our office.   If you have any questions or concerns before your next appointment please send u[place] message through [place] call our office at [phone]    TO LEAVE A MESSAGE FOR THE NURSE SELECT OPTION 2, PLEASE LEAVE A MESSAGE INCLUDING: YOUR NAME DATE OF BIRTH CALL BACK NUMBER REASON FOR CALL**this is important as we prioritize the call backs  YOU WILL RECEIVE A CALL BACK THE SAME DAY AS LONG AS YOU CALL BEFORE 4:00 PM   Do the following things EVERYDAY: Weigh yourself in the morning before breakfast. Write it down and keep it in a log. Take your medicines as prescribed Eat low salt foods--Limit salt (sodium) to 2000 mg per day.  Stay as active as you can everyday Limit all fluids for the day to less than 2 liters   At [facility] you and your health needs are our priority. As part of our continuing mission to provide you with exceptional heart care, we have created designated Provider Care Teams. These Care Teams include your primary Cardiologist (physician) and Advanced Practice  Providers (APPs- Physician Assistants and Nurse Practitioners) who all work together to provide you with the care you need, when you need it.   You may see any of the following providers on your designated Care Team at your next follow up: Dr DGlori BickersDr DHaynes Kerns NP BLyda Jester PUtahLAudry Riles PharmD   Please be sure to bring in all your medications bottles to every appointment.

## 2020-07-23 NOTE — Telephone Encounter (Signed)
Zio called to report an auto trigger. Pt in afib at 10:30 am rate [phone]. Notified Philicia Branch,CMA working in clinic with [place] today.

## 2020-07-24 DIAGNOSIS — R55 Syncope and collapse: Secondary | ICD-10-CM | POA: Diagnosis not present

## 2020-07-24 NOTE — Progress Notes (Signed)
Advanced Heart Failure Clinic Note   PCP: [name], [name], MD HF Cardiology: Dr. Aundra Dubin  HPI:  70 y.o. male with history of chronic atrial fibrillation, chronic anticoagulation therapy w/ coumadin, h/o systolic HF (felt to be tachy-mediated, w/ eventual recovery of LVEF), chronic diastolic CHF, RV failure w/ pulmonary HTN, Diabetes, Obesity, OHS/OSA w/ poor compliance w/ CPAP, h/o AKI, h/o GIB, colon cancer treated w/ resection and h/o MGUS.     Patient was admitted in 02/2014 with hypoxic respiratory failure with acute systolic CHF and possible PNA.  He was in atrial fibrillation with RVR and developed AKI. Cardiomyopathy (EF 35-40% by echo in 2016) thought to be tachycardia-mediated at the time.  He was admitted again in 4/16 with hypotension and AKI as well as multiple metabolic derangements.  He required CVVH.  Echo at that time showed EF 60-65%.  He was again admitted in 5/16, again with hypoxic respiratory failure and was intubated.  EF 60-65% by echo on this admission.  He again had AKI and had HD x 1. Renal function again recovered. Also, per records, he had a LHC in 2008 that showed no significant CAD.    Had repeat echo 12/2018 which showed normal LVEF 50-55% w/ moderate LVH, normal LA size, mild MR, severely elevated pulmonary artery systolic pressure, 77 mmHg and moderately reduced right ventricular systolic function. Echo was obtained while admitted for acute anemia 2/2 acute GIB, in the setting of supratherpaeutic INR. INR was > 10.  Hgb 5.1. INR was reversed w/ Kcentra + Vit K. Treated w/ transfusion. Underwent GI w/u. EGD showed chronic gastritis but no clear source of bleeding. Biopsy negative for H Pylori. Colonoscopy showed several polyps, removed and biopsied. No high grade dysplasia. He was placed back on Coumadin. INRs followed by PCP.    Admitted to [facility] for acute on chronic CHF 3/21. Also w/ AKI on admit. SCr was 1.8 (baseline 1.3-1.5). He was started on IV lasix for  diuresis, 60 mg bid. Diuresis sluggish and eventually changed to lasix gtt. SCr has worsened w/ attempts at diuresis, w/ SCr rising to 2.3. Echo repeated. LVEF 55-60%. RV severely enlarged w/ severely reduced RV systolic function. Pulmonary artery systolic pressure severely elevated at 76 mmhg. There is mild TR. Also mild to moderate aortic valve sclerosis/calcification is present, without any evidence of aortic stenosis. He was subsequently transferred to [facility] for further management of his HF w/ formal RHC and AHF consultation.  On arrival to [facility], He was placed on high dose IV Lasix w/ better urinary response/ diuresis. RHC 4/8 with elevated filling pressures, mixed pulmonary venous/pulmonary hypertension. V/Q scan negative for chronic PE. He was continued on IV diuretics and diuresed > 20 lb and placed on PO torsemide 40 mg bid.  SCr improved w/ diuresis. Also started on trial of sildenafil 20 mg tid. Advised to continue supplemental home O2 and to restart regular use of CPAP. Also of note, a multiple myeloma panel was done given MGUS and showed elevated M spike protein. Urine immunofixation and UPEP both unremarkable. Discharge wt was 261 lb.   Patient was admitted in 6/22 with syncope.  He was at his PCP's office, stood up, and passed out.  AED was applied, no shock. CPR was done with ROSC < 1 minute. Echo showed EF 50-55%, mild LVH, D-shaped septum, severe RV enlargement with severely decreased RV systolic function, PASP 90, IVC dilated.  He was discharged to a rehab facility in [place]. He was supposed to get a  Zio monitor at discharge but this was not done.  Creatinine was elevated to 2.5 during this hospitalization.   Patient returns today for followup of CHF/RV failure, pulmonary HTN, and OHS/OSA. He remains in a rehab facility post-recent hospitalization.  He is on oxygen but his CPAP was at home and he is not using CPAP at the rehab.  He is short of breath with moderate exertion. He can walk around his  room and in the hall without dyspnea. He uses a walker for balance.  No further lightheadedness/syncope.  He has chest soreness that is pleuritic, this has been present since CPR and is gradually improving.  Weight is up 1 lb.   Labs (6/22): K 4.8, creatinine 2.5  ROS: All systems reviewed and negative except as per HPI.   PMH: 1. Chronic atrial fibrillation.  2. Chronic systolic => diastolic CHF with prominent RV failure: LHC 2008 with no significant CAD but EF 25%.  Echo (2/16) with EF 35-40% (?tachy-mediated CMP).  Echo (5/16) with EF 65-60%, mild MR, mild RV dilation with mildly decreased RV systolic function.  - RHC (4/21): mean RA 16, PA 95/32 mean 53, mean PCWP 23, CI 2.71, PVR 5 WU.  - echo (4/21): EF 55-60%, severe RV dilation and severe RV dysfunction, PASP 76 mmHg.  - PYP scan (5/21): grade 1, H/CL 0.9 (likely negative).  - Echo (6/22): EF 50-55%, mild LVH, D-shaped septum, severe RV enlargement with severely decreased RV systolic function, PASP 90, IVC dilated. 3. OHS/OSA: Uses CPAP at night, oxygen during the day.  4. Colon cancer s/p resection 5. Type II diabetes 6. CKD stage 3: With episodes of AKI. 7. Hyperlipidemia 8. HTN 9. Pulmonary hypertension: Suspect group 3 PH.  - RHC (4/21): mean RA 16, PA 95/32 mean 53, mean PCWP 23, CI 2.71, PVR 5 WU.  - echo (4/21): EF 55-60%, severe RV dilation and severe RV dysfunction, PASP 76 mmHg.  - V/Q scan 4/21 was negative.  10. H/o GI bleeding.  11. Syncope: 6/22.   Current Outpatient Medications  Medication Sig Dispense Refill   acetaminophen (TYLENOL) 325 MG tablet Take 2 tablets (650 mg total) by mouth every 6 (six) hours as needed for mild pain (or Fever >/= 101). 12 tablet 0   allopurinol (ZYLOPRIM) 300 MG tablet Take 1 tablet (300 mg total) by mouth daily. 90 tablet 3   atorvastatin (LIPITOR) 20 MG tablet TAKE 1 TABLET BY MOUTH EVERY DAY 90 tablet 3   azelastine (OPTIVAR) 0.05 % ophthalmic solution 1 drop 2 (two) times daily.  Give 2 drops in the left eye bid for allergic itching eyes     benzonatate (TESSALON) 100 MG capsule Take 1 capsule (100 mg total) by mouth 2 (two) times daily as needed for cough.     Dulaglutide (TRULICITY) 1.5 KT/6.2BW SOPN Inject 1.5 mg into the skin once a week. 6 mL 3   FARXIGA 5 MG TABS tablet Take 5 mg by mouth daily.     ferrous sulfate 325 (65 FE) MG tablet Take 325 mg by mouth daily with breakfast.      gentamicin (GARAMYCIN) 0.3 % ophthalmic ointment 3 (three) times daily.     glucose blood (CONTOUR NEXT TEST) test strip Test BS TID Dx E11.29 300 strip 3   Insulin Glargine (LANTUS SOLOSTAR Imperial) Inject into the skin. 16 units     Insulin Pen Needle 32G X 4 MM MISC Use to give insulin daily Dx E11.29 100 each 3   KLOR-CON  M20 20 MEQ tablet TAKE 1/2 TABLET (10 MEQ TOTAL) BY MOUTH DAILY. NEED OFFICE VISIT 45 tablet 0   metoprolol succinate (TOPROL-XL) 50 MG 24 hr tablet TAKE 1 TABLET (50 MG TOTAL) BY MOUTH DAILY. TAKE WITH OR IMMEDIATELY FOLLOWING A MEAL. 90 tablet 0   pantoprazole (PROTONIX) 40 MG tablet TAKE 1 TABLET BY MOUTH EVERY DAY 90 tablet 1   polyethylene glycol (MIRALAX / GLYCOLAX) 17 g packet Take 17 g by mouth daily. 14 each 0   sildenafil (REVATIO) 20 MG tablet Take 1 tablet (20 mg total) by mouth 3 (three) times daily. 90 tablet 6   warfarin (COUMADIN) 5 MG tablet Take 1-1.5 tablets (5-7.5 mg total) by mouth See admin instructions. Take 1 1/2 tablets (7.5 mg) by mouth on Monday evening, take 1 tablet (5 mg) on all other nights of the week     torsemide (DEMADEX) 20 MG tablet Take 3 tablets (60 mg total) by mouth daily. 270 tablet 3   No current facility-administered medications for this encounter.    Allergies  Allergen Reactions   Codeine     Headache       Social History   Socioeconomic History   Marital status: Divorced    Spouse name: Not on file   Number of children: 1   Years of education: 81   Highest education level: 12th grade  Occupational History     Employer: [employer]  Tobacco Use   Smoking status: Former    Types: Cigarettes    Quit date: 08/06/1983    Years since quitting: 36.9   Smokeless tobacco: Never  Substance and Sexual Activity   Alcohol use: No   Drug use: No   Sexual activity: Not Currently  Other Topics Concern   Not on file  Social History Narrative   Lives alone   Social Determinants of Health   Financial Resource Strain: Low Risk    Difficulty of Paying Living Expenses: Not very hard  Food Insecurity: Not on file  Transportation Needs: Not on file  Physical Activity: Inactive   Days of Exercise per Week: 0 days   Minutes of Exercise per Session: 0 min  Stress: Not on file  Social Connections: Socially Isolated   Frequency of Communication with Friends and Family: More than three times a week   Frequency of Social Gatherings with Friends and Family: More than three times a week   Attends Religious Services: Never   Marine scientist or Organizations: No   Attends Archivist Meetings: Never   Marital Status: Divorced  Human resources officer Violence: Not on file      Family History  Problem Relation Age of Onset   Breast cancer Mother    Diabetes Mother    Heart attack Father     Vitals:   07/23/20 0957  BP: 102/70  Pulse: 74  SpO2: 100%  Weight: 116.6 kg (257 lb)   PHYSICAL EXAM: General: NAD Neck: Thick, JVP 8-9 cm, no thyromegaly or thyroid nodule.  Lungs: Clear to auscultation bilaterally with normal respiratory effort. CV: Nondisplaced PMI.  Heart irregular S1/S2, no S3/S4, no murmur. 1+ ankle edema.  No carotid bruit.  Normal pedal pulses.  Abdomen: Soft, nontender, no hepatosplenomegaly, no distention.  Skin: Intact without lesions or rashes.  Neurologic: Alert and oriented x 3.  Psych: Normal affect. Extremities: No clubbing or cyanosis.  HEENT: Normal.   ASSESSMENT & PLAN:  1. Cor pulmonale/RV failure: Suspect primarily due to OHS/OSA. Echo  in 4/21 with EF  55-60%, RV severely dilated w/ severe RV dysfunction in the setting of severe pulmonary hypertension w/ PASP 76 mmHg. [place] 4/21 with elevated filling pressures, mixed pulmonary venous/pulmonary hypertension=> diuresed w/ IV Lasix in hospital and placed on po torsemide. Echo in 6/22 showed EF 50-55%, mild LVH, D-shaped septum, severe RV enlargement with severely decreased RV systolic function, PASP 90, IVC dilated.  He looks at least mildly volume overloaded, NYHA class III symptoms.  - Increase torsemide to 60 mg bid.  BMET/BNP today and BMET in 10 days.  2. Pulmonary HTN: PASP elevated at 76 mmHg by 4/21 echo estimate with RV systolic function severely reduced. Most likely poorly controlled OHS/OSA as cause (group 3 PH).  V/Q scan negative for chronic PE.  Mixed PVH/PAH on RHC.  Echo in 6/22 with severe RV failure and PASP 90 mmHg.  - Continue sildenafil 20 mg tid.  - Continue home oxygen and restart CPAP at night.  I will write an order for the rehab facility to have him use CPAP at night.  3. Stage III CKD: BMET today.  - Stop losartan with elevated creatinine and relatively soft BP.  4. Atrial fibrillation: Chronic, unlikely to be able to successfully cardiovert given long-standing atrial fibrillation.  - Continue Toprol XL 50 mg daily.   - Continue warfarin.  5. OSA/OHS: Restart CPAP at night and continue oxygen during the day.  6. Diabetes - Management per PCP  7. MGUS: Previously followed by hem/onc but has not seen in several years.  Multiple myeloma panel 4/21 with presence of M-spike. - Followup with heme/onc.  8. Syncope: ?Due to orthostatic hypotension in setting of severe RV dysfunction.  - Stop losartan as above.  - He still needs to be set up with Zio patch monitor, will arrange today.   Followup with APP 3-4 weeks.   Loralie Champagne, MD 07/24/20

## 2020-07-27 ENCOUNTER — Other Ambulatory Visit (HOSPITAL_COMMUNITY): Payer: Self-pay | Admitting: Cardiology

## 2020-07-27 ENCOUNTER — Telehealth (INDEPENDENT_AMBULATORY_CARE_PROVIDER_SITE_OTHER): Payer: Self-pay | Admitting: Gastroenterology

## 2020-07-27 NOTE — Telephone Encounter (Signed)
Called the patient to discuss the results of his blood test that were sent to my office which showed a mildly decreased iron of 38 and borderline saturation of 11%, INR was therapeutic at 2.49, hemoglobin was 9.5 with MCV of 95.  Unfortunately, the patient did not pick up the phone and I left a voice message requesting him to take his iron twice a day.  We will need to have a repeat CBC, iron and ferritin checked in 1 month

## 2020-07-28 NOTE — Telephone Encounter (Signed)
Closing encounter since no call back and under chart review he is being monitored

## 2020-07-28 NOTE — Telephone Encounter (Signed)
I called and left a message to return call.

## 2020-07-30 NOTE — Telephone Encounter (Signed)
Called and left a message asked that the patient please return call.

## 2020-07-31 NOTE — Telephone Encounter (Signed)
I called and left a message to return call.

## 2020-08-03 ENCOUNTER — Encounter (INDEPENDENT_AMBULATORY_CARE_PROVIDER_SITE_OTHER): Payer: Self-pay

## 2020-08-03 NOTE — Telephone Encounter (Signed)
Mailed a letter asking the patient to call the office.

## 2020-08-06 ENCOUNTER — Encounter (INDEPENDENT_AMBULATORY_CARE_PROVIDER_SITE_OTHER): Payer: Self-pay

## 2020-08-07 NOTE — Telephone Encounter (Signed)
Thanks

## 2020-08-17 DIAGNOSIS — E119 Type 2 diabetes mellitus without complications: Secondary | ICD-10-CM | POA: Diagnosis not present

## 2020-08-17 DIAGNOSIS — G4730 Sleep apnea, unspecified: Secondary | ICD-10-CM | POA: Diagnosis not present

## 2020-08-17 DIAGNOSIS — J449 Chronic obstructive pulmonary disease, unspecified: Secondary | ICD-10-CM | POA: Diagnosis not present

## 2020-08-17 DIAGNOSIS — I5020 Unspecified systolic (congestive) heart failure: Secondary | ICD-10-CM | POA: Diagnosis not present

## 2020-08-18 NOTE — Progress Notes (Signed)
Advanced Heart Failure Clinic Note   PCP: [name], [name], MD HF Cardiology: Dr. Aundra Dubin  HPI:  70 y.o. male with history of chronic atrial fibrillation, chronic anticoagulation therapy w/ coumadin, h/o systolic HF (felt to be tachy-mediated, w/ eventual recovery of LVEF), chronic diastolic CHF, RV failure w/ pulmonary HTN, Diabetes, Obesity, OHS/OSA w/ poor compliance w/ CPAP, h/o AKI, h/o GIB, colon cancer treated w/ resection and h/o MGUS.     Patient was admitted in 02/2014 with hypoxic respiratory failure with acute systolic CHF and possible PNA.  He was in atrial fibrillation with RVR and developed AKI. Cardiomyopathy (EF 35-40% by echo in 2016) thought to be tachycardia-mediated at the time.  He was admitted again in 4/16 with hypotension and AKI as well as multiple metabolic derangements.  He required CVVH.  Echo at that time showed EF 60-65%.  He was again admitted in 5/16, again with hypoxic respiratory failure and was intubated.  EF 60-65% by echo on this admission.  He again had AKI and had HD x 1. Renal function again recovered. Also, per records, he had a LHC in 2008 that showed no significant CAD.    Had repeat echo 12/2018 which showed normal LVEF 50-55% w/ moderate LVH, normal LA size, mild MR, severely elevated pulmonary artery systolic pressure, 77 mmHg and moderately reduced right ventricular systolic function. Echo was obtained while admitted for acute anemia 2/2 acute GIB, in the setting of supratherpaeutic INR. INR was > 10.  Hgb 5.1. INR was reversed w/ Kcentra + Vit K. Treated w/ transfusion. Underwent GI w/u. EGD showed chronic gastritis but no clear source of bleeding. Biopsy negative for H Pylori. Colonoscopy showed several polyps, removed and biopsied. No high grade dysplasia. He was placed back on Coumadin. INRs followed by PCP.    Admitted to [facility] for acute on chronic CHF 3/21. Also w/ AKI on admit. SCr was 1.8 (baseline 1.3-1.5). He was started on IV lasix for  diuresis, 60 mg bid. Diuresis sluggish and eventually changed to lasix gtt. SCr has worsened w/ attempts at diuresis, w/ SCr rising to 2.3. Echo repeated. LVEF 55-60%. RV severely enlarged w/ severely reduced RV systolic function. Pulmonary artery systolic pressure severely elevated at 76 mmhg. There is mild TR. Also mild to moderate aortic valve sclerosis/calcification is present, without any evidence of aortic stenosis. He was subsequently transferred to [facility] for further management of his HF w/ formal RHC and AHF consultation.  On arrival to [facility], He was placed on high dose IV Lasix w/ better urinary response/ diuresis. RHC 4/8 with elevated filling pressures, mixed pulmonary venous/pulmonary hypertension. V/Q scan negative for chronic PE. He was continued on IV diuretics and diuresed > 20 lb and placed on PO torsemide 40 mg bid.  SCr improved w/ diuresis. Also started on trial of sildenafil 20 mg tid. Advised to continue supplemental home O2 and to restart regular use of CPAP. Also of note, a multiple myeloma panel was done given MGUS and showed elevated M spike protein. Urine immunofixation and UPEP both unremarkable. Discharge wt was 261 lb.   Patient was admitted in 6/22 with syncope.  He was at his PCP's office, stood up, and passed out.  AED was applied, no shock. CPR was done with ROSC < 1 minute. Echo showed EF 50-55%, mild LVH, D-shaped septum, severe RV enlargement with severely decreased RV systolic function, PASP 90, IVC dilated.  He was discharged to a rehab facility in [place]. He was supposed to get a  Zio monitor at discharge but this was not done.  Creatinine was elevated to 2.5 during this hospitalization.   Today he returns for HF follow up. Swelling is better, no longer orthopneic. Uses wheelchair mostly due to deconditioning and not dyspnea. Able to get around at facility with walker without significant dyspnea. Wears oxygen during the day 2L and CPAP at night. Denies CP, dizziness, syncope,  edema, or palpitations. Appetite ok. Weight at facility ranges from 250-261 pounds. Taking all medications. He says he is being discharged from facility today.   Labs (6/22): K 4.8, creatinine 2.5 Labs (7/22): K 3.8, creatinine 2.34  ROS: All systems reviewed and negative except as per HPI.   PMH: 1. Chronic atrial fibrillation.  2. Chronic systolic => diastolic CHF with prominent RV failure: LHC 2008 with no significant CAD but EF 25%.  Echo (2/16) with EF 35-40% (?tachy-mediated CMP).  Echo (5/16) with EF 65-60%, mild MR, mild RV dilation with mildly decreased RV systolic function.  - RHC (4/21): mean RA 16, PA 95/32 mean 53, mean PCWP 23, CI 2.71, PVR 5 WU.  - echo (4/21): EF 55-60%, severe RV dilation and severe RV dysfunction, PASP 76 mmHg.  - PYP scan (5/21): grade 1, H/CL 0.9 (likely negative).  - Echo (6/22): EF 50-55%, mild LVH, D-shaped septum, severe RV enlargement with severely decreased RV systolic function, PASP 90, IVC dilated. 3. OHS/OSA: Uses CPAP at night, oxygen during the day.  4. Colon cancer s/p resection 5. Type II diabetes 6. CKD stage 3: With episodes of AKI. 7. Hyperlipidemia 8. HTN 9. Pulmonary hypertension: Suspect group 3 PH.  - RHC (4/21): mean RA 16, PA 95/32 mean 53, mean PCWP 23, CI 2.71, PVR 5 WU.  - echo (4/21): EF 55-60%, severe RV dilation and severe RV dysfunction, PASP 76 mmHg.  - V/Q scan 4/21 was negative.  10. H/o GI bleeding.  11. Syncope: 6/22.   Current Outpatient Medications  Medication Sig Dispense Refill   acetaminophen (TYLENOL) 325 MG tablet Take 2 tablets (650 mg total) by mouth every 6 (six) hours as needed for mild pain (or Fever >/= 101). 12 tablet 0   allopurinol (ZYLOPRIM) 300 MG tablet Take 1 tablet (300 mg total) by mouth daily. 90 tablet 3   atorvastatin (LIPITOR) 20 MG tablet TAKE 1 TABLET BY MOUTH EVERY DAY 90 tablet 3   azelastine (OPTIVAR) 0.05 % ophthalmic solution 1 drop 2 (two) times daily. Give 2 drops in the left eye  bid for allergic itching eyes     benzonatate (TESSALON) 100 MG capsule Take 1 capsule (100 mg total) by mouth 2 (two) times daily as needed for cough.     Dulaglutide (TRULICITY) 1.5 XL/2.4MW SOPN Inject 1.5 mg into the skin once a week. 6 mL 3   FARXIGA 5 MG TABS tablet Take 5 mg by mouth daily.     ferrous sulfate 325 (65 FE) MG tablet Take 325 mg by mouth daily with breakfast.      gentamicin (GARAMYCIN) 0.3 % ophthalmic ointment 3 (three) times daily.     glucose blood (CONTOUR NEXT TEST) test strip Test BS TID Dx E11.29 300 strip 3   Insulin Glargine (LANTUS SOLOSTAR ) Inject into the skin. 16 units     Insulin Pen Needle 32G X 4 MM MISC Use to give insulin daily Dx E11.29 100 each 3   metoprolol succinate (TOPROL-XL) 50 MG 24 hr tablet TAKE 1 TABLET (50 MG TOTAL) BY MOUTH DAILY. TAKE WITH  OR IMMEDIATELY FOLLOWING A MEAL. 90 tablet 0   pantoprazole (PROTONIX) 40 MG tablet TAKE 1 TABLET BY MOUTH EVERY DAY 90 tablet 1   polyethylene glycol (MIRALAX / GLYCOLAX) 17 g packet Take 17 g by mouth daily. 14 each 0   potassium chloride (KLOR-CON) 10 MEQ tablet Take 10 mEq by mouth daily.     sildenafil (REVATIO) 20 MG tablet Take 1 tablet (20 mg total) by mouth 3 (three) times daily. 90 tablet 6   torsemide (DEMADEX) 20 MG tablet Take 3 tablets (60 mg total) by mouth daily. 270 tablet 3   warfarin (COUMADIN) 7.5 MG tablet Take 7.5 mg by mouth daily.     No current facility-administered medications for this encounter.   Allergies  Allergen Reactions   Codeine     Headache    Social History   Socioeconomic History   Marital status: Divorced    Spouse name: Not on file   Number of children: 1   Years of education: 40   Highest education level: 12th grade  Occupational History    Employer: [employer]  Tobacco Use   Smoking status: Former    Types: Cigarettes    Quit date: 08/06/1983    Years since quitting: 37.0   Smokeless tobacco: Never  Substance and Sexual Activity    Alcohol use: No   Drug use: No   Sexual activity: Not Currently  Other Topics Concern   Not on file  Social History Narrative   Lives alone   Social Determinants of Health   Financial Resource Strain: Low Risk    Difficulty of Paying Living Expenses: Not very hard  Food Insecurity: Not on file  Transportation Needs: Not on file  Physical Activity: Inactive   Days of Exercise per Week: 0 days   Minutes of Exercise per Session: 0 min  Stress: Not on file  Social Connections: Socially Isolated   Frequency of Communication with Friends and Family: More than three times a week   Frequency of Social Gatherings with Friends and Family: More than three times a week   Attends Religious Services: Never   Marine scientist or Organizations: No   Attends Archivist Meetings: Never   Marital Status: Divorced  Human resources officer Violence: Not on file   Family History  Problem Relation Age of Onset   Breast cancer Mother    Diabetes Mother    Heart attack Father    BP 102/64   Pulse 85   Wt 113.5 kg (250 lb 3.2 oz)   SpO2 99%   BMI 42.95 kg/m   Wt Readings from Last 3 Encounters:  08/19/20 113.5 kg (250 lb 3.2 oz)  07/23/20 116.6 kg (257 lb)  07/21/20 116.6 kg (257 lb)   PHYSICAL EXAM: General:  NAD. No resp difficulty, arrived in [facility] on oxygen 2 L HEENT: Normal Neck: Supple. No JVD. Carotids 2+ bilat; no bruits. No lymphadenopathy or thryomegaly appreciated. Cor: PMI nondisplaced. Irregular rate & rhythm. No rubs, gallops or murmurs. Lungs: Clear Abdomen: Obese, nontender, nondistended. No hepatosplenomegaly. No bruits or masses. Good bowel sounds. Extremities: No cyanosis, clubbing, rash, edema Neuro: Alert & oriented x 3, cranial nerves grossly intact. Moves all 4 extremities w/o difficulty. Affect pleasant.  ASSESSMENT & PLAN: 1. Cor pulmonale/RV failure: Suspect primarily due to OHS/OSA. Echo in 4/21 with EF 55-60%, RV severely dilated w/ severe RV  dysfunction in the setting of severe pulmonary hypertension w/ PASP 76 mmHg. [place] 4/21 with elevated  filling pressures, mixed pulmonary venous/pulmonary hypertension=> diuresed w/ IV Lasix in hospital and placed on po torsemide. Echo in 6/22 showed EF 50-55%, mild LVH, D-shaped septum, severe RV enlargement with severely decreased RV systolic function, PASP 90, IVC dilated. NYHA class II-III, functional class difficult due to general physical deconditioning and body habitus, but he says he is not overly dyspneic with ADLs. He is not volume overloaded on exam, weight down 7 lbs.  - Continue torsemide 60 mg bid.  BMET/BNP today.  - Continue Farxiga 10 mg daily. 2. Pulmonary HTN: PASP elevated at 76 mmHg by 4/21 echo estimate with RV systolic function severely reduced. Most likely poorly controlled OHS/OSA as cause (group 3 PH).  V/Q scan negative for chronic PE.  Mixed PVH/PAH on RHC.  Echo in 6/22 with severe RV failure and PASP 90 mmHg.  - Continue sildenafil 20 mg tid.  - Continue home oxygen and CPAP at night.   3. Stage III CKD: BMET today.  - Off losartan with elevated creatinine and relatively soft BP.  4. Atrial fibrillation: Chronic, unlikely to be able to successfully cardiovert given long-standing atrial fibrillation.  - Continue Toprol XL 50 mg daily.   - Continue warfarin.  5. OSA/OHS: Continue CPAP at night and continue oxygen during the day.  6. Diabetes: - Management per PCP.  7. MGUS: Previously followed by hem/onc but has not seen in several years.  Multiple myeloma panel 4/21 with presence of M-spike. - Followup with heme/onc.  8. Syncope: ? Due to orthostatic hypotension in setting of severe RV dysfunction.  - Off losartan as above. No further issues. - Zio patch placed last visit, results pending.  Followup with Dr. Aundra Dubin in 2-3 months.  Francisco, FNP 08/19/20

## 2020-08-19 ENCOUNTER — Other Ambulatory Visit: Payer: Self-pay

## 2020-08-19 ENCOUNTER — Encounter (HOSPITAL_COMMUNITY): Payer: Self-pay

## 2020-08-19 ENCOUNTER — Ambulatory Visit (HOSPITAL_COMMUNITY)
Admission: RE | Admit: 2020-08-19 | Discharge: 2020-08-19 | Disposition: A | Discharge status: Home / Self Care | Payer: Medicare Other | Source: Ambulatory Visit | Attending: Family Medicine | Admitting: Family Medicine

## 2020-08-19 VITALS — BP 102/64 | HR 85 | Wt 250.2 lb

## 2020-08-19 DIAGNOSIS — D472 Monoclonal gammopathy: Secondary | ICD-10-CM | POA: Diagnosis not present

## 2020-08-19 DIAGNOSIS — Z794 Long term (current) use of insulin: Secondary | ICD-10-CM | POA: Diagnosis not present

## 2020-08-19 DIAGNOSIS — K2950 Unspecified chronic gastritis without bleeding: Secondary | ICD-10-CM | POA: Diagnosis not present

## 2020-08-19 DIAGNOSIS — E785 Hyperlipidemia, unspecified: Secondary | ICD-10-CM | POA: Insufficient documentation

## 2020-08-19 DIAGNOSIS — I428 Other cardiomyopathies: Secondary | ICD-10-CM

## 2020-08-19 DIAGNOSIS — N183 Chronic kidney disease, stage 3 unspecified: Secondary | ICD-10-CM

## 2020-08-19 DIAGNOSIS — I130 Hypertensive heart and chronic kidney disease with heart failure and stage 1 through stage 4 chronic kidney disease, or unspecified chronic kidney disease: Secondary | ICD-10-CM | POA: Insufficient documentation

## 2020-08-19 DIAGNOSIS — Z79899 Other long term (current) drug therapy: Secondary | ICD-10-CM | POA: Insufficient documentation

## 2020-08-19 DIAGNOSIS — Z885 Allergy status to narcotic agent status: Secondary | ICD-10-CM | POA: Diagnosis not present

## 2020-08-19 DIAGNOSIS — R55 Syncope and collapse: Secondary | ICD-10-CM | POA: Diagnosis not present

## 2020-08-19 DIAGNOSIS — I4891 Unspecified atrial fibrillation: Secondary | ICD-10-CM | POA: Diagnosis not present

## 2020-08-19 DIAGNOSIS — Z7901 Long term (current) use of anticoagulants: Secondary | ICD-10-CM | POA: Insufficient documentation

## 2020-08-19 DIAGNOSIS — Z9989 Dependence on other enabling machines and devices: Secondary | ICD-10-CM

## 2020-08-19 DIAGNOSIS — G4733 Obstructive sleep apnea (adult) (pediatric): Secondary | ICD-10-CM

## 2020-08-19 DIAGNOSIS — E1122 Type 2 diabetes mellitus with diabetic chronic kidney disease: Secondary | ICD-10-CM

## 2020-08-19 DIAGNOSIS — I5042 Chronic combined systolic (congestive) and diastolic (congestive) heart failure: Secondary | ICD-10-CM | POA: Insufficient documentation

## 2020-08-19 DIAGNOSIS — I482 Chronic atrial fibrillation, unspecified: Secondary | ICD-10-CM | POA: Insufficient documentation

## 2020-08-19 DIAGNOSIS — I50812 Chronic right heart failure: Secondary | ICD-10-CM | POA: Diagnosis not present

## 2020-08-19 DIAGNOSIS — I2729 Other secondary pulmonary hypertension: Secondary | ICD-10-CM | POA: Insufficient documentation

## 2020-08-19 DIAGNOSIS — I2781 Cor pulmonale (chronic): Secondary | ICD-10-CM | POA: Insufficient documentation

## 2020-08-19 DIAGNOSIS — I272 Pulmonary hypertension, unspecified: Secondary | ICD-10-CM | POA: Diagnosis not present

## 2020-08-19 DIAGNOSIS — Z7984 Long term (current) use of oral hypoglycemic drugs: Secondary | ICD-10-CM | POA: Diagnosis not present

## 2020-08-19 DIAGNOSIS — E669 Obesity, unspecified: Secondary | ICD-10-CM | POA: Diagnosis not present

## 2020-08-19 LAB — CBC
HCT: 34.8 % — ABNORMAL LOW (ref 39.0–52.0)
Hemoglobin: 10.3 g/dL — ABNORMAL LOW (ref 13.0–17.0)
MCH: 28.8 pg (ref 26.0–34.0)
MCHC: 29.6 g/dL — ABNORMAL LOW (ref 30.0–36.0)
MCV: 97.2 fL (ref 80.0–100.0)
Platelets: 151 10*3/uL (ref 150–400)
RBC: 3.58 MIL/uL — ABNORMAL LOW (ref 4.22–5.81)
RDW: 19.7 % — ABNORMAL HIGH (ref 11.5–15.5)
WBC: 5.4 10*3/uL (ref 4.0–10.5)
nRBC: 0 % (ref 0.0–0.2)

## 2020-08-19 LAB — BASIC METABOLIC PANEL
Anion gap: 10 (ref 5–15)
BUN: 43 mg/dL — ABNORMAL HIGH (ref 8–23)
CO2: 27 mmol/L (ref 22–32)
Calcium: 9.2 mg/dL (ref 8.9–10.3)
Chloride: 100 mmol/L (ref 98–111)
Creatinine, Ser: 2.09 mg/dL — ABNORMAL HIGH (ref 0.61–1.24)
GFR, Estimated: 34 mL/min — ABNORMAL LOW (ref >60)
Glucose, Bld: 144 mg/dL — ABNORMAL HIGH (ref 70–99)
Potassium: 3.8 mmol/L (ref 3.5–5.1)
Sodium: 137 mmol/L (ref 135–145)

## 2020-08-19 NOTE — Patient Instructions (Signed)
It was great to see you today! No medication changes are needed at this time.   Labs today We will only contact you if something comes back abnormal or we need to make some changes. Otherwise no news is good news!  Your physician recommends that you schedule a follow-up appointment in: 3 months with Dr Aundra Dubin   Do the following things EVERYDAY: Weigh yourself in the morning before breakfast. Write it down and keep it in a log. Take your medicines as prescribed Eat low salt foods--Limit salt (sodium) to 2000 mg per day.  Stay as active as you can everyday Limit all fluids for the day to less than 2 liters  At the [facility], you and your health needs are our priority. As part of our continuing mission to provide you with exceptional heart care, we have created designated Provider Care Teams. These Care Teams include your primary Cardiologist (physician) and Advanced Practice Providers (APPs- Physician Assistants and Nurse Practitioners) who all work together to provide you with the care you need, when you need it.   You may see any of the following providers on your designated Care Team at your next follow up: Dr Glori Bickers Dr Loralie Champagne Dr Patrice Paradise, NP Lyda Jester, [place] Ginnie Smart Audry Riles, PharmD   Please be sure to bring in all your medications bottles to every appointment.

## 2020-08-20 DIAGNOSIS — I272 Pulmonary hypertension, unspecified: Secondary | ICD-10-CM | POA: Diagnosis not present

## 2020-08-20 DIAGNOSIS — Z9981 Dependence on supplemental oxygen: Secondary | ICD-10-CM | POA: Diagnosis not present

## 2020-08-20 DIAGNOSIS — I2510 Atherosclerotic heart disease of native coronary artery without angina pectoris: Secondary | ICD-10-CM | POA: Diagnosis not present

## 2020-08-20 DIAGNOSIS — R791 Abnormal coagulation profile: Secondary | ICD-10-CM | POA: Diagnosis not present

## 2020-08-20 DIAGNOSIS — I4891 Unspecified atrial fibrillation: Secondary | ICD-10-CM | POA: Diagnosis not present

## 2020-08-20 DIAGNOSIS — I428 Other cardiomyopathies: Secondary | ICD-10-CM | POA: Diagnosis not present

## 2020-08-20 DIAGNOSIS — I130 Hypertensive heart and chronic kidney disease with heart failure and stage 1 through stage 4 chronic kidney disease, or unspecified chronic kidney disease: Secondary | ICD-10-CM | POA: Diagnosis not present

## 2020-08-20 DIAGNOSIS — I50810 Right heart failure, unspecified: Secondary | ICD-10-CM | POA: Diagnosis not present

## 2020-08-20 DIAGNOSIS — Z85038 Personal history of other malignant neoplasm of large intestine: Secondary | ICD-10-CM | POA: Diagnosis not present

## 2020-08-20 DIAGNOSIS — Z87440 Personal history of urinary (tract) infections: Secondary | ICD-10-CM | POA: Diagnosis not present

## 2020-08-20 DIAGNOSIS — M6281 Muscle weakness (generalized): Secondary | ICD-10-CM | POA: Diagnosis not present

## 2020-08-20 DIAGNOSIS — N179 Acute kidney failure, unspecified: Secondary | ICD-10-CM | POA: Diagnosis not present

## 2020-08-20 DIAGNOSIS — M109 Gout, unspecified: Secondary | ICD-10-CM | POA: Diagnosis not present

## 2020-08-20 DIAGNOSIS — E1122 Type 2 diabetes mellitus with diabetic chronic kidney disease: Secondary | ICD-10-CM | POA: Diagnosis not present

## 2020-08-20 DIAGNOSIS — D649 Anemia, unspecified: Secondary | ICD-10-CM | POA: Diagnosis not present

## 2020-08-20 DIAGNOSIS — Z794 Long term (current) use of insulin: Secondary | ICD-10-CM | POA: Diagnosis not present

## 2020-08-20 DIAGNOSIS — J9611 Chronic respiratory failure with hypoxia: Secondary | ICD-10-CM | POA: Diagnosis not present

## 2020-08-21 DIAGNOSIS — I4891 Unspecified atrial fibrillation: Secondary | ICD-10-CM | POA: Diagnosis not present

## 2020-08-21 DIAGNOSIS — E1122 Type 2 diabetes mellitus with diabetic chronic kidney disease: Secondary | ICD-10-CM | POA: Diagnosis not present

## 2020-08-21 DIAGNOSIS — I50810 Right heart failure, unspecified: Secondary | ICD-10-CM | POA: Diagnosis not present

## 2020-08-21 DIAGNOSIS — R791 Abnormal coagulation profile: Secondary | ICD-10-CM | POA: Diagnosis not present

## 2020-08-21 DIAGNOSIS — I130 Hypertensive heart and chronic kidney disease with heart failure and stage 1 through stage 4 chronic kidney disease, or unspecified chronic kidney disease: Secondary | ICD-10-CM | POA: Diagnosis not present

## 2020-08-21 DIAGNOSIS — J9611 Chronic respiratory failure with hypoxia: Secondary | ICD-10-CM | POA: Diagnosis not present

## 2020-08-24 ENCOUNTER — Other Ambulatory Visit (HOSPITAL_COMMUNITY): Payer: Self-pay | Admitting: *Deleted

## 2020-08-24 MED ORDER — SILDENAFIL CITRATE 20 MG PO TABS: 20.0000 mg | ORAL_TABLET | Freq: Three times a day (TID) | ORAL | 11 refills | Status: DC

## 2020-08-24 MED ORDER — TORSEMIDE 20 MG PO TABS: 60.0000 mg | ORAL_TABLET | Freq: Two times a day (BID) | ORAL | Status: DC

## 2020-08-25 DIAGNOSIS — J9611 Chronic respiratory failure with hypoxia: Secondary | ICD-10-CM | POA: Diagnosis not present

## 2020-08-25 DIAGNOSIS — I130 Hypertensive heart and chronic kidney disease with heart failure and stage 1 through stage 4 chronic kidney disease, or unspecified chronic kidney disease: Secondary | ICD-10-CM | POA: Diagnosis not present

## 2020-08-25 DIAGNOSIS — I50810 Right heart failure, unspecified: Secondary | ICD-10-CM | POA: Diagnosis not present

## 2020-08-25 DIAGNOSIS — I4891 Unspecified atrial fibrillation: Secondary | ICD-10-CM | POA: Diagnosis not present

## 2020-08-25 DIAGNOSIS — E1122 Type 2 diabetes mellitus with diabetic chronic kidney disease: Secondary | ICD-10-CM | POA: Diagnosis not present

## 2020-08-25 DIAGNOSIS — R791 Abnormal coagulation profile: Secondary | ICD-10-CM | POA: Diagnosis not present

## 2020-08-26 ENCOUNTER — Telehealth (HOSPITAL_COMMUNITY): Payer: Self-pay | Admitting: Pharmacy Technician

## 2020-08-26 ENCOUNTER — Ambulatory Visit (INDEPENDENT_AMBULATORY_CARE_PROVIDER_SITE_OTHER): Payer: Medicare Other | Admitting: Family Medicine

## 2020-08-26 ENCOUNTER — Other Ambulatory Visit: Payer: Self-pay

## 2020-08-26 ENCOUNTER — Encounter: Payer: Self-pay | Admitting: Family Medicine

## 2020-08-26 VITALS — BP 109/73 | HR 102 | Ht 64.0 in | Wt 258.0 lb

## 2020-08-26 DIAGNOSIS — I4891 Unspecified atrial fibrillation: Secondary | ICD-10-CM

## 2020-08-26 DIAGNOSIS — I428 Other cardiomyopathies: Secondary | ICD-10-CM

## 2020-08-26 LAB — COAGUCHEK XS/INR WAIVED
INR: 3.1 — ABNORMAL HIGH (ref 0.9–1.1)
Prothrombin Time: 37.3 s

## 2020-08-26 NOTE — Telephone Encounter (Signed)
Patient Advocate Encounter   Received notification from [employer] that prior authorization for Sildenafil is required.   PA submitted on CoverMyMeds Key [id number] Status is pending   Will continue to follow.

## 2020-08-26 NOTE — Progress Notes (Addendum)
BP 109/73   Pulse (!) 102   Ht 5' 4"  (1.626 m)   Wt 258 lb (117 kg)   SpO2 100%   BMI 44.29 kg/m    Subjective:   Patient ID: William Flores, male    DOB: 1950-06-16, 70 y.o.   MRN: [id number]  HPI: William Flores is a 70 y.o. male presenting on 08/26/2020 for Nursing rehab follow up    HPI Patient is coming in for nursing home follow up, follows up with Dr Aundra Dubin and Echo checked out. He was sent to the hospital with cardiac arrest.  He is on sildenafil for pulmonary hypertension. He feels like his breathing is back to normal.  He is doing well.  He denies any further arrhythmias or chest pains or syncopal episodes since he left the hospital.  He has been at the nursing home and rehab and has been doing physical therapy now at home as well since he left the rehab.  He does have a nurse coming by to check on him.  He does have his cardiologist checking on him as well.  They did increase his medicine for pulmonary hypertension and he has been doing better with that.  Patient was sent to the hospital from our office with chest compressions from a cardiac arrest on 06/19/2020 and was then sent to a nursing home and had been there until just last week.  Relevant past medical, surgical, family and social history reviewed and updated as indicated. Interim medical history since our last visit reviewed. Allergies and medications reviewed and updated.  Review of Systems  Constitutional:  Negative for chills and fever.  Respiratory:  Negative for shortness of breath and wheezing.   Cardiovascular:  Negative for chest pain and leg swelling.  Musculoskeletal:  Negative for back pain and gait problem.  Skin:  Negative for rash.  Neurological:  Negative for dizziness, weakness and numbness.  All other systems reviewed and are negative.  Per HPI unless specifically indicated above   Allergies as of 08/26/2020       Reactions   Codeine    Headache        Medication List        Accurate  as of August 26, 2020  3:13 PM. If you have any questions, ask your nurse or doctor.          acetaminophen 325 MG tablet Commonly known as: TYLENOL Take 2 tablets (650 mg total) by mouth every 6 (six) hours as needed for mild pain (or Fever >/= 101).   allopurinol 300 MG tablet Commonly known as: ZYLOPRIM Take 1 tablet (300 mg total) by mouth daily.   atorvastatin 20 MG tablet Commonly known as: LIPITOR TAKE 1 TABLET BY MOUTH EVERY DAY   azelastine 0.05 % ophthalmic solution Commonly known as: OPTIVAR 1 drop 2 (two) times daily. Give 2 drops in the left eye bid for allergic itching eyes   benzonatate 100 MG capsule Commonly known as: TESSALON Take 1 capsule (100 mg total) by mouth 2 (two) times daily as needed for cough.   Contour Next Test test strip Generic drug: glucose blood Test BS TID Dx E11.29   Farxiga 5 MG Tabs tablet Generic drug: dapagliflozin propanediol Take 5 mg by mouth daily.   ferrous sulfate 325 (65 FE) MG tablet Take 325 mg by mouth daily with breakfast.   gentamicin 0.3 % ophthalmic ointment Commonly known as: GARAMYCIN 3 (three) times daily.   Insulin Pen Needle 32G  X 4 MM Misc Use to give insulin daily Dx E11.29   LANTUS SOLOSTAR [place] Inject into the skin. 16 units   metoprolol succinate 50 MG 24 hr tablet Commonly known as: TOPROL-XL TAKE 1 TABLET (50 MG TOTAL) BY MOUTH DAILY. TAKE WITH OR IMMEDIATELY FOLLOWING A MEAL.   pantoprazole 40 MG tablet Commonly known as: PROTONIX TAKE 1 TABLET BY MOUTH EVERY DAY   polyethylene glycol 17 g packet Commonly known as: MIRALAX / GLYCOLAX Take 17 g by mouth daily.   potassium chloride 10 MEQ tablet Commonly known as: KLOR-CON Take 10 mEq by mouth daily.   sildenafil 20 MG tablet Commonly known as: REVATIO Take 1 tablet (20 mg total) by mouth 3 (three) times daily.   torsemide 20 MG tablet Commonly known as: DEMADEX Take 3 tablets (60 mg total) by mouth 2 (two) times daily.   Trulicity  1.5 VO/3.6GO Sopn Generic drug: Dulaglutide Inject 1.5 mg into the skin once a week.   warfarin 7.5 MG tablet Commonly known as: COUMADIN Take as directed by the anticoagulation clinic. If you are unsure how to take this medication, talk to your nurse or doctor. Original instructions: Take 7.5 mg by mouth daily.         Objective:   BP 109/73   Pulse (!) 102   Ht 5' 4"  (1.626 m)   Wt 258 lb (117 kg)   SpO2 100%   BMI 44.29 kg/m   Wt Readings from Last 3 Encounters:  08/26/20 258 lb (117 kg)  08/19/20 250 lb 3.2 oz (113.5 kg)  07/23/20 257 lb (116.6 kg)    Physical Exam Vitals and nursing note reviewed.  Constitutional:      General: He is not in acute distress.    Appearance: He is well-developed. He is not diaphoretic.  Eyes:     General: No scleral icterus.       Right eye: No discharge.     Conjunctiva/sclera: Conjunctivae normal.     Pupils: Pupils are equal, round, and reactive to light.  Neck:     Thyroid: No thyromegaly.  Cardiovascular:     Rate and Rhythm: Normal rate. Rhythm irregular.     Heart sounds: Normal heart sounds. No murmur heard. Pulmonary:     Effort: Pulmonary effort is normal. No respiratory distress.     Breath sounds: Normal breath sounds. No wheezing.  Musculoskeletal:        General: Normal range of motion.     Cervical back: Neck supple.  Lymphadenopathy:     Cervical: No cervical adenopathy.  Skin:    General: Skin is warm and dry.     Findings: No rash.  Neurological:     Mental Status: He is alert and oriented to person, place, and time.     Coordination: Coordination normal.  Psychiatric:        Behavior: Behavior normal.   Description   INR was 3.1 today (goal is 2-3) take 5 mg the everyday Recheck in 6 weeks       Assessment & Plan:   Problem List Items Addressed This Visit       Cardiovascular and Mediastinum   Nonischemic cardiomyopathy ([place]) (Chronic)   Relevant Orders   CoaguChek XS/INR Waived   CBC  with Differential/Platelet   CMP14+EGFR   Atrial fibrillation with slow ventricular response ([place]) - Primary   Relevant Orders   CoaguChek XS/INR Waived   CBC with Differential/Platelet   CMP14+EGFR    Will do  bloodwork, seems to be doing well. Follow up plan: Return if symptoms worsen or fail to improve, for 6-week INR recheck.  Counseling provided for all of the vaccine components Orders Placed This Encounter  Procedures   CoaguChek XS/INR Waived   CBC with Differential/Platelet   CMP14+EGFR    Caryl Pina, MD [facility] 08/26/2020, 3:13 PM

## 2020-08-27 ENCOUNTER — Other Ambulatory Visit (HOSPITAL_COMMUNITY): Payer: Self-pay

## 2020-08-27 DIAGNOSIS — I50810 Right heart failure, unspecified: Secondary | ICD-10-CM | POA: Diagnosis not present

## 2020-08-27 DIAGNOSIS — E1122 Type 2 diabetes mellitus with diabetic chronic kidney disease: Secondary | ICD-10-CM | POA: Diagnosis not present

## 2020-08-27 DIAGNOSIS — J9611 Chronic respiratory failure with hypoxia: Secondary | ICD-10-CM | POA: Diagnosis not present

## 2020-08-27 DIAGNOSIS — R791 Abnormal coagulation profile: Secondary | ICD-10-CM | POA: Diagnosis not present

## 2020-08-27 DIAGNOSIS — I130 Hypertensive heart and chronic kidney disease with heart failure and stage 1 through stage 4 chronic kidney disease, or unspecified chronic kidney disease: Secondary | ICD-10-CM | POA: Diagnosis not present

## 2020-08-27 DIAGNOSIS — I4891 Unspecified atrial fibrillation: Secondary | ICD-10-CM | POA: Diagnosis not present

## 2020-08-27 LAB — CBC WITH DIFFERENTIAL/PLATELET
Basophils Absolute: 0.1 10*3/uL (ref 0.0–0.2)
Basos: 1 %
EOS (ABSOLUTE): 0.2 10*3/uL (ref 0.0–0.4)
Eos: 4 %
Hematocrit: 31.7 % — ABNORMAL LOW (ref 37.5–51.0)
Hemoglobin: 9.4 g/dL — ABNORMAL LOW (ref 13.0–17.7)
Immature Grans (Abs): 0 10*3/uL (ref 0.0–0.1)
Immature Granulocytes: 0 %
Lymphocytes Absolute: 0.7 10*3/uL (ref 0.7–3.1)
Lymphs: 12 %
MCH: 26.9 pg (ref 26.6–33.0)
MCHC: 29.7 g/dL — ABNORMAL LOW (ref 31.5–35.7)
MCV: 91 fL (ref 79–97)
Monocytes Absolute: 0.5 10*3/uL (ref 0.1–0.9)
Monocytes: 8 %
Neutrophils Absolute: 4.3 10*3/uL (ref 1.4–7.0)
Neutrophils: 75 %
Platelets: 142 10*3/uL — ABNORMAL LOW (ref 150–450)
RBC: 3.5 x10E6/uL — ABNORMAL LOW (ref 4.14–5.80)
RDW: 17.5 % — ABNORMAL HIGH (ref 11.6–15.4)
WBC: 5.7 10*3/uL (ref 3.4–10.8)

## 2020-08-27 LAB — CMP14+EGFR
ALT: 15 IU/L (ref 0–44)
AST: 17 IU/L (ref 0–40)
Albumin/Globulin Ratio: 1.3 (ref 1.2–2.2)
Albumin: 3.8 g/dL (ref 3.8–4.8)
Alkaline Phosphatase: 96 IU/L (ref 44–121)
BUN/Creatinine Ratio: 32 — ABNORMAL HIGH (ref 10–24)
BUN: 59 mg/dL — ABNORMAL HIGH (ref 8–27)
Bilirubin Total: 0.4 mg/dL (ref 0.0–1.2)
CO2: 19 mmol/L — ABNORMAL LOW (ref 20–29)
Calcium: 9.1 mg/dL (ref 8.6–10.2)
Chloride: 104 mmol/L (ref 96–106)
Creatinine, Ser: 1.85 mg/dL — ABNORMAL HIGH (ref 0.76–1.27)
Globulin, Total: 3 g/dL (ref 1.5–4.5)
Glucose: 166 mg/dL — ABNORMAL HIGH (ref 65–99)
Potassium: 4.4 mmol/L (ref 3.5–5.2)
Sodium: 143 mmol/L (ref 134–144)
Total Protein: 6.8 g/dL (ref 6.0–8.5)
eGFR: 39 mL/min/{1.73_m2} — ABNORMAL LOW (ref >59)

## 2020-08-27 NOTE — Telephone Encounter (Signed)
Advanced Heart Failure Patient Advocate Encounter  Prior Authorization for Sildenafil has been approved.    PA# [id number] Effective dates: 01/04/20 through 08/26/21  Charlann Boxer, CPhT

## 2020-08-28 DIAGNOSIS — E1122 Type 2 diabetes mellitus with diabetic chronic kidney disease: Secondary | ICD-10-CM | POA: Diagnosis not present

## 2020-08-28 DIAGNOSIS — J9611 Chronic respiratory failure with hypoxia: Secondary | ICD-10-CM | POA: Diagnosis not present

## 2020-08-28 DIAGNOSIS — R791 Abnormal coagulation profile: Secondary | ICD-10-CM | POA: Diagnosis not present

## 2020-08-28 DIAGNOSIS — I50810 Right heart failure, unspecified: Secondary | ICD-10-CM | POA: Diagnosis not present

## 2020-08-28 DIAGNOSIS — I4891 Unspecified atrial fibrillation: Secondary | ICD-10-CM | POA: Diagnosis not present

## 2020-08-28 DIAGNOSIS — I130 Hypertensive heart and chronic kidney disease with heart failure and stage 1 through stage 4 chronic kidney disease, or unspecified chronic kidney disease: Secondary | ICD-10-CM | POA: Diagnosis not present

## 2020-08-31 ENCOUNTER — Ambulatory Visit (INDEPENDENT_AMBULATORY_CARE_PROVIDER_SITE_OTHER): Payer: Medicare Other

## 2020-08-31 ENCOUNTER — Other Ambulatory Visit: Payer: Self-pay

## 2020-08-31 DIAGNOSIS — I2510 Atherosclerotic heart disease of native coronary artery without angina pectoris: Secondary | ICD-10-CM

## 2020-08-31 DIAGNOSIS — I272 Pulmonary hypertension, unspecified: Secondary | ICD-10-CM | POA: Diagnosis not present

## 2020-08-31 DIAGNOSIS — I50810 Right heart failure, unspecified: Secondary | ICD-10-CM | POA: Diagnosis not present

## 2020-08-31 DIAGNOSIS — D649 Anemia, unspecified: Secondary | ICD-10-CM | POA: Diagnosis not present

## 2020-08-31 DIAGNOSIS — I428 Other cardiomyopathies: Secondary | ICD-10-CM

## 2020-08-31 DIAGNOSIS — Z87440 Personal history of urinary (tract) infections: Secondary | ICD-10-CM

## 2020-08-31 DIAGNOSIS — I130 Hypertensive heart and chronic kidney disease with heart failure and stage 1 through stage 4 chronic kidney disease, or unspecified chronic kidney disease: Secondary | ICD-10-CM

## 2020-08-31 DIAGNOSIS — J9611 Chronic respiratory failure with hypoxia: Secondary | ICD-10-CM | POA: Diagnosis not present

## 2020-08-31 DIAGNOSIS — E1122 Type 2 diabetes mellitus with diabetic chronic kidney disease: Secondary | ICD-10-CM | POA: Diagnosis not present

## 2020-08-31 DIAGNOSIS — R791 Abnormal coagulation profile: Secondary | ICD-10-CM

## 2020-08-31 DIAGNOSIS — I4891 Unspecified atrial fibrillation: Secondary | ICD-10-CM | POA: Diagnosis not present

## 2020-08-31 DIAGNOSIS — Z85038 Personal history of other malignant neoplasm of large intestine: Secondary | ICD-10-CM

## 2020-08-31 DIAGNOSIS — N179 Acute kidney failure, unspecified: Secondary | ICD-10-CM | POA: Diagnosis not present

## 2020-08-31 DIAGNOSIS — Z794 Long term (current) use of insulin: Secondary | ICD-10-CM

## 2020-08-31 DIAGNOSIS — M109 Gout, unspecified: Secondary | ICD-10-CM

## 2020-08-31 DIAGNOSIS — M6281 Muscle weakness (generalized): Secondary | ICD-10-CM

## 2020-09-01 DIAGNOSIS — I130 Hypertensive heart and chronic kidney disease with heart failure and stage 1 through stage 4 chronic kidney disease, or unspecified chronic kidney disease: Secondary | ICD-10-CM | POA: Diagnosis not present

## 2020-09-01 DIAGNOSIS — I4821 Permanent atrial fibrillation: Secondary | ICD-10-CM | POA: Diagnosis not present

## 2020-09-01 DIAGNOSIS — Z7901 Long term (current) use of anticoagulants: Secondary | ICD-10-CM | POA: Diagnosis not present

## 2020-09-01 DIAGNOSIS — I50810 Right heart failure, unspecified: Secondary | ICD-10-CM | POA: Diagnosis not present

## 2020-09-01 DIAGNOSIS — E1122 Type 2 diabetes mellitus with diabetic chronic kidney disease: Secondary | ICD-10-CM | POA: Diagnosis not present

## 2020-09-01 DIAGNOSIS — R791 Abnormal coagulation profile: Secondary | ICD-10-CM | POA: Diagnosis not present

## 2020-09-01 DIAGNOSIS — I4891 Unspecified atrial fibrillation: Secondary | ICD-10-CM | POA: Diagnosis not present

## 2020-09-01 DIAGNOSIS — J9611 Chronic respiratory failure with hypoxia: Secondary | ICD-10-CM | POA: Diagnosis not present

## 2020-09-02 ENCOUNTER — Telehealth: Payer: Self-pay | Admitting: *Deleted

## 2020-09-02 DIAGNOSIS — E1122 Type 2 diabetes mellitus with diabetic chronic kidney disease: Secondary | ICD-10-CM | POA: Diagnosis not present

## 2020-09-02 DIAGNOSIS — R791 Abnormal coagulation profile: Secondary | ICD-10-CM | POA: Diagnosis not present

## 2020-09-02 DIAGNOSIS — I130 Hypertensive heart and chronic kidney disease with heart failure and stage 1 through stage 4 chronic kidney disease, or unspecified chronic kidney disease: Secondary | ICD-10-CM | POA: Diagnosis not present

## 2020-09-02 DIAGNOSIS — J9611 Chronic respiratory failure with hypoxia: Secondary | ICD-10-CM | POA: Diagnosis not present

## 2020-09-02 DIAGNOSIS — I4891 Unspecified atrial fibrillation: Secondary | ICD-10-CM | POA: Diagnosis not present

## 2020-09-02 DIAGNOSIS — I50810 Right heart failure, unspecified: Secondary | ICD-10-CM | POA: Diagnosis not present

## 2020-09-02 NOTE — Telephone Encounter (Signed)
Pt aware by phone

## 2020-09-02 NOTE — Telephone Encounter (Signed)
Fax received mdINR PT/INR self testing service Test date/time 09/01/20 1052 am INR 2.9

## 2020-09-02 NOTE — Telephone Encounter (Signed)
Description   INR was 2.9 today (goal is 2-3) take 5 mg the everyday Recheck in 6 weeks    Caryl Pina, MD [place] 09/02/2020, 2:05 PM

## 2020-09-03 DIAGNOSIS — I130 Hypertensive heart and chronic kidney disease with heart failure and stage 1 through stage 4 chronic kidney disease, or unspecified chronic kidney disease: Secondary | ICD-10-CM | POA: Diagnosis not present

## 2020-09-03 DIAGNOSIS — E1122 Type 2 diabetes mellitus with diabetic chronic kidney disease: Secondary | ICD-10-CM | POA: Diagnosis not present

## 2020-09-03 DIAGNOSIS — I50810 Right heart failure, unspecified: Secondary | ICD-10-CM | POA: Diagnosis not present

## 2020-09-03 DIAGNOSIS — I4891 Unspecified atrial fibrillation: Secondary | ICD-10-CM | POA: Diagnosis not present

## 2020-09-03 DIAGNOSIS — R791 Abnormal coagulation profile: Secondary | ICD-10-CM | POA: Diagnosis not present

## 2020-09-03 DIAGNOSIS — J9611 Chronic respiratory failure with hypoxia: Secondary | ICD-10-CM | POA: Diagnosis not present

## 2020-09-04 NOTE — Addendum Note (Signed)
Encounter addended by: Micki Riley, RN on: 09/04/2020 10:47 AM  Actions taken: Imaging Exam ended

## 2020-09-08 DIAGNOSIS — E1122 Type 2 diabetes mellitus with diabetic chronic kidney disease: Secondary | ICD-10-CM | POA: Diagnosis not present

## 2020-09-08 DIAGNOSIS — I130 Hypertensive heart and chronic kidney disease with heart failure and stage 1 through stage 4 chronic kidney disease, or unspecified chronic kidney disease: Secondary | ICD-10-CM | POA: Diagnosis not present

## 2020-09-08 DIAGNOSIS — I50810 Right heart failure, unspecified: Secondary | ICD-10-CM | POA: Diagnosis not present

## 2020-09-08 DIAGNOSIS — R791 Abnormal coagulation profile: Secondary | ICD-10-CM | POA: Diagnosis not present

## 2020-09-08 DIAGNOSIS — I4891 Unspecified atrial fibrillation: Secondary | ICD-10-CM | POA: Diagnosis not present

## 2020-09-08 DIAGNOSIS — J9611 Chronic respiratory failure with hypoxia: Secondary | ICD-10-CM | POA: Diagnosis not present

## 2020-09-10 ENCOUNTER — Telehealth (HOSPITAL_COMMUNITY): Payer: Self-pay | Admitting: *Deleted

## 2020-09-10 DIAGNOSIS — R791 Abnormal coagulation profile: Secondary | ICD-10-CM | POA: Diagnosis not present

## 2020-09-10 DIAGNOSIS — E1122 Type 2 diabetes mellitus with diabetic chronic kidney disease: Secondary | ICD-10-CM | POA: Diagnosis not present

## 2020-09-10 DIAGNOSIS — I4891 Unspecified atrial fibrillation: Secondary | ICD-10-CM | POA: Diagnosis not present

## 2020-09-10 DIAGNOSIS — J9611 Chronic respiratory failure with hypoxia: Secondary | ICD-10-CM | POA: Diagnosis not present

## 2020-09-10 DIAGNOSIS — I130 Hypertensive heart and chronic kidney disease with heart failure and stage 1 through stage 4 chronic kidney disease, or unspecified chronic kidney disease: Secondary | ICD-10-CM | POA: Diagnosis not present

## 2020-09-10 DIAGNOSIS — I50810 Right heart failure, unspecified: Secondary | ICD-10-CM | POA: Diagnosis not present

## 2020-09-10 MED ORDER — METOPROLOL SUCCINATE ER 50 MG PO TB24: 75.0000 mg | ORAL_TABLET | Freq: Every day | ORAL | 6 refills | Status: DC

## 2020-09-10 NOTE — Telephone Encounter (Signed)
-----   Message from Larey Dresser, MD sent at 09/07/2020 10:53 AM EDT ----- No event to explain syncope.  He has chronic AF with 2 short NSVT runs noted.  Would increase Toprol XL to 75 mg daily with NSVT episodes and occasional increased HR in AF.

## 2020-09-10 NOTE — Telephone Encounter (Signed)
Pt aware, agreeable, and verbalized understanding, new rx sent in

## 2020-09-14 ENCOUNTER — Other Ambulatory Visit: Payer: Self-pay | Admitting: Family Medicine

## 2020-09-15 ENCOUNTER — Telehealth: Payer: Self-pay | Admitting: *Deleted

## 2020-09-15 DIAGNOSIS — I130 Hypertensive heart and chronic kidney disease with heart failure and stage 1 through stage 4 chronic kidney disease, or unspecified chronic kidney disease: Secondary | ICD-10-CM | POA: Diagnosis not present

## 2020-09-15 DIAGNOSIS — J9611 Chronic respiratory failure with hypoxia: Secondary | ICD-10-CM | POA: Diagnosis not present

## 2020-09-15 DIAGNOSIS — I50810 Right heart failure, unspecified: Secondary | ICD-10-CM | POA: Diagnosis not present

## 2020-09-15 DIAGNOSIS — R791 Abnormal coagulation profile: Secondary | ICD-10-CM | POA: Diagnosis not present

## 2020-09-15 DIAGNOSIS — I4891 Unspecified atrial fibrillation: Secondary | ICD-10-CM | POA: Diagnosis not present

## 2020-09-15 DIAGNOSIS — E1122 Type 2 diabetes mellitus with diabetic chronic kidney disease: Secondary | ICD-10-CM | POA: Diagnosis not present

## 2020-09-15 NOTE — Telephone Encounter (Signed)
Left message informing pt.  Asked him to return call if needed.

## 2020-09-15 NOTE — Telephone Encounter (Signed)
Description   INR was 2.0 today (goal is 2-3) take 5 mg the everyday Recheck in 1-2 weeks    Caryl Pina, MD [place] 09/15/2020, 2:43 PM

## 2020-09-15 NOTE — Telephone Encounter (Signed)
Fax received mdINR PT/INR self testing service Test date/time 09/10/2020 11:57am INR 2.0

## 2020-09-19 DIAGNOSIS — I130 Hypertensive heart and chronic kidney disease with heart failure and stage 1 through stage 4 chronic kidney disease, or unspecified chronic kidney disease: Secondary | ICD-10-CM | POA: Diagnosis not present

## 2020-09-19 DIAGNOSIS — Z9981 Dependence on supplemental oxygen: Secondary | ICD-10-CM | POA: Diagnosis not present

## 2020-09-19 DIAGNOSIS — Z87440 Personal history of urinary (tract) infections: Secondary | ICD-10-CM | POA: Diagnosis not present

## 2020-09-19 DIAGNOSIS — Z794 Long term (current) use of insulin: Secondary | ICD-10-CM | POA: Diagnosis not present

## 2020-09-19 DIAGNOSIS — I4891 Unspecified atrial fibrillation: Secondary | ICD-10-CM | POA: Diagnosis not present

## 2020-09-19 DIAGNOSIS — M6281 Muscle weakness (generalized): Secondary | ICD-10-CM | POA: Diagnosis not present

## 2020-09-19 DIAGNOSIS — R791 Abnormal coagulation profile: Secondary | ICD-10-CM | POA: Diagnosis not present

## 2020-09-19 DIAGNOSIS — N179 Acute kidney failure, unspecified: Secondary | ICD-10-CM | POA: Diagnosis not present

## 2020-09-19 DIAGNOSIS — M109 Gout, unspecified: Secondary | ICD-10-CM | POA: Diagnosis not present

## 2020-09-19 DIAGNOSIS — I2510 Atherosclerotic heart disease of native coronary artery without angina pectoris: Secondary | ICD-10-CM | POA: Diagnosis not present

## 2020-09-19 DIAGNOSIS — Z85038 Personal history of other malignant neoplasm of large intestine: Secondary | ICD-10-CM | POA: Diagnosis not present

## 2020-09-19 DIAGNOSIS — D649 Anemia, unspecified: Secondary | ICD-10-CM | POA: Diagnosis not present

## 2020-09-19 DIAGNOSIS — E1122 Type 2 diabetes mellitus with diabetic chronic kidney disease: Secondary | ICD-10-CM | POA: Diagnosis not present

## 2020-09-19 DIAGNOSIS — I428 Other cardiomyopathies: Secondary | ICD-10-CM | POA: Diagnosis not present

## 2020-09-19 DIAGNOSIS — J9611 Chronic respiratory failure with hypoxia: Secondary | ICD-10-CM | POA: Diagnosis not present

## 2020-09-19 DIAGNOSIS — I272 Pulmonary hypertension, unspecified: Secondary | ICD-10-CM | POA: Diagnosis not present

## 2020-09-19 DIAGNOSIS — I50810 Right heart failure, unspecified: Secondary | ICD-10-CM | POA: Diagnosis not present

## 2020-09-22 ENCOUNTER — Other Ambulatory Visit (HOSPITAL_COMMUNITY): Payer: Self-pay | Admitting: Cardiology

## 2020-09-25 DIAGNOSIS — I130 Hypertensive heart and chronic kidney disease with heart failure and stage 1 through stage 4 chronic kidney disease, or unspecified chronic kidney disease: Secondary | ICD-10-CM | POA: Diagnosis not present

## 2020-09-25 DIAGNOSIS — I50810 Right heart failure, unspecified: Secondary | ICD-10-CM | POA: Diagnosis not present

## 2020-09-25 DIAGNOSIS — I4891 Unspecified atrial fibrillation: Secondary | ICD-10-CM | POA: Diagnosis not present

## 2020-09-25 DIAGNOSIS — J9611 Chronic respiratory failure with hypoxia: Secondary | ICD-10-CM | POA: Diagnosis not present

## 2020-09-25 DIAGNOSIS — E1122 Type 2 diabetes mellitus with diabetic chronic kidney disease: Secondary | ICD-10-CM | POA: Diagnosis not present

## 2020-09-25 DIAGNOSIS — R791 Abnormal coagulation profile: Secondary | ICD-10-CM | POA: Diagnosis not present

## 2020-09-28 ENCOUNTER — Telehealth: Payer: Self-pay | Admitting: *Deleted

## 2020-09-28 NOTE — Telephone Encounter (Signed)
Pt informed of results. He understood how to take his Coumadin and has no questions.

## 2020-09-28 NOTE — Telephone Encounter (Signed)
Fax received mdINR PT/INR self testing service Test date/time 09/27/20 1105 am INR 2.1

## 2020-09-28 NOTE — Telephone Encounter (Signed)
Description   INR was 2.1 today (goal is 2-3) take 5 mg the everyday Recheck in 1-2 weeks    Caryl Pina, MD [facility] 09/28/2020, 11:59 AM

## 2020-09-29 ENCOUNTER — Other Ambulatory Visit (HOSPITAL_COMMUNITY): Payer: Self-pay | Admitting: Cardiology

## 2020-10-01 DIAGNOSIS — R791 Abnormal coagulation profile: Secondary | ICD-10-CM | POA: Diagnosis not present

## 2020-10-01 DIAGNOSIS — I4891 Unspecified atrial fibrillation: Secondary | ICD-10-CM | POA: Diagnosis not present

## 2020-10-01 DIAGNOSIS — I50810 Right heart failure, unspecified: Secondary | ICD-10-CM | POA: Diagnosis not present

## 2020-10-01 DIAGNOSIS — I130 Hypertensive heart and chronic kidney disease with heart failure and stage 1 through stage 4 chronic kidney disease, or unspecified chronic kidney disease: Secondary | ICD-10-CM | POA: Diagnosis not present

## 2020-10-01 DIAGNOSIS — E1122 Type 2 diabetes mellitus with diabetic chronic kidney disease: Secondary | ICD-10-CM | POA: Diagnosis not present

## 2020-10-01 DIAGNOSIS — J9611 Chronic respiratory failure with hypoxia: Secondary | ICD-10-CM | POA: Diagnosis not present

## 2020-10-08 ENCOUNTER — Telehealth: Payer: Self-pay | Admitting: *Deleted

## 2020-10-08 NOTE — Telephone Encounter (Signed)
Pt aware by phone

## 2020-10-08 NOTE — Telephone Encounter (Signed)
Fax received mdINR PT/INR self testing service Test date/time 10/08/20 134 pm INR 5.0

## 2020-10-08 NOTE — Telephone Encounter (Signed)
Description   INR was 5.0 today (goal is 2-3) Hold for 2 days, I do not know what happened all of a sudden why he is high but make sure he is only taking 1 tablet a day and recheck in 1 week, Then take 5 mg the everyday Recheck in 1-2 weeks    Caryl Pina, MD [place] 10/08/2020, 3:42 PM

## 2020-10-14 ENCOUNTER — Other Ambulatory Visit: Payer: Self-pay

## 2020-10-14 ENCOUNTER — Other Ambulatory Visit (HOSPITAL_COMMUNITY): Payer: Medicare Other

## 2020-10-14 ENCOUNTER — Inpatient Hospital Stay (HOSPITAL_COMMUNITY)
Admission: EM | Admit: 2020-10-14 | Discharge: 2020-10-24 | DRG: 377 | Disposition: A | Discharge status: Skilled Nursing Facility | Payer: Medicare Other | Attending: Internal Medicine | Admitting: Internal Medicine

## 2020-10-14 ENCOUNTER — Inpatient Hospital Stay (HOSPITAL_COMMUNITY): Payer: Medicare Other

## 2020-10-14 ENCOUNTER — Emergency Department (HOSPITAL_COMMUNITY): Payer: Medicare Other

## 2020-10-14 ENCOUNTER — Encounter (HOSPITAL_COMMUNITY): Payer: Self-pay | Admitting: Emergency Medicine

## 2020-10-14 DIAGNOSIS — J9811 Atelectasis: Secondary | ICD-10-CM | POA: Diagnosis not present

## 2020-10-14 DIAGNOSIS — N184 Chronic kidney disease, stage 4 (severe): Secondary | ICD-10-CM | POA: Diagnosis present

## 2020-10-14 DIAGNOSIS — G4733 Obstructive sleep apnea (adult) (pediatric): Secondary | ICD-10-CM | POA: Diagnosis present

## 2020-10-14 DIAGNOSIS — I428 Other cardiomyopathies: Secondary | ICD-10-CM | POA: Diagnosis present

## 2020-10-14 DIAGNOSIS — I700 Atherosclerosis of aorta: Secondary | ICD-10-CM | POA: Diagnosis not present

## 2020-10-14 DIAGNOSIS — R911 Solitary pulmonary nodule: Secondary | ICD-10-CM | POA: Diagnosis not present

## 2020-10-14 DIAGNOSIS — I2781 Cor pulmonale (chronic): Secondary | ICD-10-CM | POA: Diagnosis not present

## 2020-10-14 DIAGNOSIS — N3289 Other specified disorders of bladder: Secondary | ICD-10-CM | POA: Diagnosis not present

## 2020-10-14 DIAGNOSIS — J811 Chronic pulmonary edema: Secondary | ICD-10-CM | POA: Diagnosis not present

## 2020-10-14 DIAGNOSIS — R279 Unspecified lack of coordination: Secondary | ICD-10-CM | POA: Diagnosis not present

## 2020-10-14 DIAGNOSIS — R41841 Cognitive communication deficit: Secondary | ICD-10-CM | POA: Diagnosis present

## 2020-10-14 DIAGNOSIS — I5082 Biventricular heart failure: Secondary | ICD-10-CM | POA: Diagnosis present

## 2020-10-14 DIAGNOSIS — K635 Polyp of colon: Secondary | ICD-10-CM | POA: Diagnosis present

## 2020-10-14 DIAGNOSIS — I81 Portal vein thrombosis: Secondary | ICD-10-CM | POA: Diagnosis not present

## 2020-10-14 DIAGNOSIS — Z6841 Body Mass Index (BMI) 40.0 and over, adult: Secondary | ICD-10-CM

## 2020-10-14 DIAGNOSIS — K621 Rectal polyp: Secondary | ICD-10-CM | POA: Diagnosis present

## 2020-10-14 DIAGNOSIS — R600 Localized edema: Secondary | ICD-10-CM | POA: Diagnosis not present

## 2020-10-14 DIAGNOSIS — Z85038 Personal history of other malignant neoplasm of large intestine: Secondary | ICD-10-CM | POA: Diagnosis not present

## 2020-10-14 DIAGNOSIS — R52 Pain, unspecified: Secondary | ICD-10-CM | POA: Diagnosis not present

## 2020-10-14 DIAGNOSIS — I50812 Chronic right heart failure: Secondary | ICD-10-CM | POA: Diagnosis present

## 2020-10-14 DIAGNOSIS — R195 Other fecal abnormalities: Secondary | ICD-10-CM | POA: Diagnosis not present

## 2020-10-14 DIAGNOSIS — Z794 Long term (current) use of insulin: Secondary | ICD-10-CM

## 2020-10-14 DIAGNOSIS — Z20822 Contact with and (suspected) exposure to covid-19: Secondary | ICD-10-CM | POA: Diagnosis present

## 2020-10-14 DIAGNOSIS — M6281 Muscle weakness (generalized): Secondary | ICD-10-CM | POA: Diagnosis present

## 2020-10-14 DIAGNOSIS — D649 Anemia, unspecified: Secondary | ICD-10-CM

## 2020-10-14 DIAGNOSIS — I110 Hypertensive heart disease with heart failure: Secondary | ICD-10-CM | POA: Diagnosis not present

## 2020-10-14 DIAGNOSIS — I4891 Unspecified atrial fibrillation: Secondary | ICD-10-CM | POA: Diagnosis not present

## 2020-10-14 DIAGNOSIS — K254 Chronic or unspecified gastric ulcer with hemorrhage: Principal | ICD-10-CM | POA: Diagnosis present

## 2020-10-14 DIAGNOSIS — N1832 Chronic kidney disease, stage 3b: Secondary | ICD-10-CM

## 2020-10-14 DIAGNOSIS — Z7901 Long term (current) use of anticoagulants: Secondary | ICD-10-CM | POA: Diagnosis not present

## 2020-10-14 DIAGNOSIS — K7581 Nonalcoholic steatohepatitis (NASH): Secondary | ICD-10-CM | POA: Diagnosis present

## 2020-10-14 DIAGNOSIS — Z833 Family history of diabetes mellitus: Secondary | ICD-10-CM

## 2020-10-14 DIAGNOSIS — R188 Other ascites: Secondary | ICD-10-CM | POA: Diagnosis not present

## 2020-10-14 DIAGNOSIS — D62 Acute posthemorrhagic anemia: Secondary | ICD-10-CM | POA: Diagnosis not present

## 2020-10-14 DIAGNOSIS — K922 Gastrointestinal hemorrhage, unspecified: Secondary | ICD-10-CM | POA: Diagnosis not present

## 2020-10-14 DIAGNOSIS — Z79899 Other long term (current) drug therapy: Secondary | ICD-10-CM

## 2020-10-14 DIAGNOSIS — I482 Chronic atrial fibrillation, unspecified: Secondary | ICD-10-CM | POA: Diagnosis present

## 2020-10-14 DIAGNOSIS — K7469 Other cirrhosis of liver: Secondary | ICD-10-CM | POA: Diagnosis present

## 2020-10-14 DIAGNOSIS — R918 Other nonspecific abnormal finding of lung field: Secondary | ICD-10-CM | POA: Diagnosis not present

## 2020-10-14 DIAGNOSIS — E662 Morbid (severe) obesity with alveolar hypoventilation: Secondary | ICD-10-CM | POA: Diagnosis present

## 2020-10-14 DIAGNOSIS — D509 Iron deficiency anemia, unspecified: Secondary | ICD-10-CM | POA: Diagnosis present

## 2020-10-14 DIAGNOSIS — R161 Splenomegaly, not elsewhere classified: Secondary | ICD-10-CM | POA: Diagnosis present

## 2020-10-14 DIAGNOSIS — K766 Portal hypertension: Secondary | ICD-10-CM | POA: Diagnosis present

## 2020-10-14 DIAGNOSIS — D696 Thrombocytopenia, unspecified: Secondary | ICD-10-CM | POA: Diagnosis present

## 2020-10-14 DIAGNOSIS — K429 Umbilical hernia without obstruction or gangrene: Secondary | ICD-10-CM | POA: Diagnosis present

## 2020-10-14 DIAGNOSIS — R0902 Hypoxemia: Secondary | ICD-10-CM | POA: Diagnosis not present

## 2020-10-14 DIAGNOSIS — Z9981 Dependence on supplemental oxygen: Secondary | ICD-10-CM

## 2020-10-14 DIAGNOSIS — E1169 Type 2 diabetes mellitus with other specified complication: Secondary | ICD-10-CM | POA: Diagnosis present

## 2020-10-14 DIAGNOSIS — I272 Pulmonary hypertension, unspecified: Secondary | ICD-10-CM | POA: Diagnosis present

## 2020-10-14 DIAGNOSIS — D123 Benign neoplasm of transverse colon: Secondary | ICD-10-CM | POA: Diagnosis not present

## 2020-10-14 DIAGNOSIS — I130 Hypertensive heart and chronic kidney disease with heart failure and stage 1 through stage 4 chronic kidney disease, or unspecified chronic kidney disease: Secondary | ICD-10-CM | POA: Diagnosis present

## 2020-10-14 DIAGNOSIS — I119 Hypertensive heart disease without heart failure: Secondary | ICD-10-CM | POA: Diagnosis present

## 2020-10-14 DIAGNOSIS — R140 Abdominal distension (gaseous): Secondary | ICD-10-CM

## 2020-10-14 DIAGNOSIS — E782 Mixed hyperlipidemia: Secondary | ICD-10-CM | POA: Diagnosis present

## 2020-10-14 DIAGNOSIS — R069 Unspecified abnormalities of breathing: Secondary | ICD-10-CM | POA: Diagnosis not present

## 2020-10-14 DIAGNOSIS — I5033 Acute on chronic diastolic (congestive) heart failure: Secondary | ICD-10-CM | POA: Diagnosis present

## 2020-10-14 DIAGNOSIS — D472 Monoclonal gammopathy: Secondary | ICD-10-CM | POA: Diagnosis present

## 2020-10-14 DIAGNOSIS — I509 Heart failure, unspecified: Secondary | ICD-10-CM | POA: Diagnosis not present

## 2020-10-14 DIAGNOSIS — Z8719 Personal history of other diseases of the digestive system: Secondary | ICD-10-CM

## 2020-10-14 DIAGNOSIS — K7460 Unspecified cirrhosis of liver: Secondary | ICD-10-CM | POA: Diagnosis not present

## 2020-10-14 DIAGNOSIS — I2510 Atherosclerotic heart disease of native coronary artery without angina pectoris: Secondary | ICD-10-CM | POA: Diagnosis present

## 2020-10-14 DIAGNOSIS — E1129 Type 2 diabetes mellitus with other diabetic kidney complication: Secondary | ICD-10-CM | POA: Diagnosis present

## 2020-10-14 DIAGNOSIS — D128 Benign neoplasm of rectum: Secondary | ICD-10-CM | POA: Diagnosis not present

## 2020-10-14 DIAGNOSIS — D6869 Other thrombophilia: Secondary | ICD-10-CM | POA: Diagnosis present

## 2020-10-14 DIAGNOSIS — D122 Benign neoplasm of ascending colon: Secondary | ICD-10-CM | POA: Diagnosis not present

## 2020-10-14 DIAGNOSIS — J9611 Chronic respiratory failure with hypoxia: Secondary | ICD-10-CM | POA: Diagnosis present

## 2020-10-14 DIAGNOSIS — I517 Cardiomegaly: Secondary | ICD-10-CM | POA: Diagnosis not present

## 2020-10-14 DIAGNOSIS — M109 Gout, unspecified: Secondary | ICD-10-CM | POA: Diagnosis present

## 2020-10-14 DIAGNOSIS — R1084 Generalized abdominal pain: Secondary | ICD-10-CM | POA: Diagnosis not present

## 2020-10-14 DIAGNOSIS — I2721 Secondary pulmonary arterial hypertension: Secondary | ICD-10-CM | POA: Diagnosis present

## 2020-10-14 DIAGNOSIS — E1122 Type 2 diabetes mellitus with diabetic chronic kidney disease: Secondary | ICD-10-CM | POA: Diagnosis present

## 2020-10-14 DIAGNOSIS — R609 Edema, unspecified: Secondary | ICD-10-CM | POA: Diagnosis present

## 2020-10-14 DIAGNOSIS — N183 Chronic kidney disease, stage 3 unspecified: Secondary | ICD-10-CM | POA: Diagnosis present

## 2020-10-14 DIAGNOSIS — I5023 Acute on chronic systolic (congestive) heart failure: Secondary | ICD-10-CM | POA: Diagnosis not present

## 2020-10-14 DIAGNOSIS — D125 Benign neoplasm of sigmoid colon: Secondary | ICD-10-CM | POA: Diagnosis not present

## 2020-10-14 DIAGNOSIS — D124 Benign neoplasm of descending colon: Secondary | ICD-10-CM | POA: Diagnosis not present

## 2020-10-14 DIAGNOSIS — K7689 Other specified diseases of liver: Secondary | ICD-10-CM | POA: Diagnosis not present

## 2020-10-14 DIAGNOSIS — Z743 Need for continuous supervision: Secondary | ICD-10-CM | POA: Diagnosis not present

## 2020-10-14 DIAGNOSIS — J90 Pleural effusion, not elsewhere classified: Secondary | ICD-10-CM | POA: Diagnosis not present

## 2020-10-14 DIAGNOSIS — E785 Hyperlipidemia, unspecified: Secondary | ICD-10-CM | POA: Diagnosis present

## 2020-10-14 DIAGNOSIS — R079 Chest pain, unspecified: Secondary | ICD-10-CM | POA: Diagnosis not present

## 2020-10-14 DIAGNOSIS — J9621 Acute and chronic respiratory failure with hypoxia: Secondary | ICD-10-CM | POA: Diagnosis present

## 2020-10-14 DIAGNOSIS — I50813 Acute on chronic right heart failure: Secondary | ICD-10-CM | POA: Diagnosis not present

## 2020-10-14 DIAGNOSIS — Z8249 Family history of ischemic heart disease and other diseases of the circulatory system: Secondary | ICD-10-CM

## 2020-10-14 DIAGNOSIS — T45515A Adverse effect of anticoagulants, initial encounter: Secondary | ICD-10-CM | POA: Diagnosis present

## 2020-10-14 DIAGNOSIS — I10 Essential (primary) hypertension: Secondary | ICD-10-CM | POA: Diagnosis not present

## 2020-10-14 DIAGNOSIS — I50810 Right heart failure, unspecified: Secondary | ICD-10-CM | POA: Diagnosis present

## 2020-10-14 DIAGNOSIS — D6859 Other primary thrombophilia: Secondary | ICD-10-CM | POA: Diagnosis present

## 2020-10-14 DIAGNOSIS — E119 Type 2 diabetes mellitus without complications: Secondary | ICD-10-CM | POA: Diagnosis present

## 2020-10-14 DIAGNOSIS — R2689 Other abnormalities of gait and mobility: Secondary | ICD-10-CM | POA: Diagnosis present

## 2020-10-14 DIAGNOSIS — R059 Cough, unspecified: Secondary | ICD-10-CM | POA: Diagnosis not present

## 2020-10-14 LAB — CBC WITH DIFFERENTIAL/PLATELET
Abs Immature Granulocytes: 0.02 10*3/uL (ref 0.00–0.07)
Basophils Absolute: 0.1 10*3/uL (ref 0.0–0.1)
Basophils Relative: 1 %
Eosinophils Absolute: 0.1 10*3/uL (ref 0.0–0.5)
Eosinophils Relative: 2 %
HCT: 25.8 % — ABNORMAL LOW (ref 39.0–52.0)
Hemoglobin: 7.1 g/dL — ABNORMAL LOW (ref 13.0–17.0)
Immature Granulocytes: 0 %
Lymphocytes Relative: 11 %
Lymphs Abs: 0.7 10*3/uL (ref 0.7–4.0)
MCH: 24.6 pg — ABNORMAL LOW (ref 26.0–34.0)
MCHC: 27.5 g/dL — ABNORMAL LOW (ref 30.0–36.0)
MCV: 89.3 fL (ref 80.0–100.0)
Monocytes Absolute: 0.7 10*3/uL (ref 0.1–1.0)
Monocytes Relative: 12 %
Neutro Abs: 4.4 10*3/uL (ref 1.7–7.7)
Neutrophils Relative %: 74 %
Platelets: 162 10*3/uL (ref 150–400)
RBC: 2.89 MIL/uL — ABNORMAL LOW (ref 4.22–5.81)
RDW: 19.4 % — ABNORMAL HIGH (ref 11.5–15.5)
WBC: 6 10*3/uL (ref 4.0–10.5)
nRBC: 0.8 % — ABNORMAL HIGH (ref 0.0–0.2)

## 2020-10-14 LAB — COMPREHENSIVE METABOLIC PANEL
ALT: 31 U/L (ref 0–44)
AST: 34 U/L (ref 15–41)
Albumin: 3.6 g/dL (ref 3.5–5.0)
Alkaline Phosphatase: 75 U/L (ref 38–126)
Anion gap: 8 (ref 5–15)
BUN: 78 mg/dL — ABNORMAL HIGH (ref 8–23)
CO2: 24 mmol/L (ref 22–32)
Calcium: 8.6 mg/dL — ABNORMAL LOW (ref 8.9–10.3)
Chloride: 105 mmol/L (ref 98–111)
Creatinine, Ser: 2.68 mg/dL — ABNORMAL HIGH (ref 0.61–1.24)
GFR, Estimated: 25 mL/min — ABNORMAL LOW (ref >60)
Glucose, Bld: 151 mg/dL — ABNORMAL HIGH (ref 70–99)
Potassium: 4.1 mmol/L (ref 3.5–5.1)
Sodium: 137 mmol/L (ref 135–145)
Total Bilirubin: 0.9 mg/dL (ref 0.3–1.2)
Total Protein: 7 g/dL (ref 6.5–8.1)

## 2020-10-14 LAB — PROTIME-INR
INR: 3.6 — ABNORMAL HIGH (ref 0.8–1.2)
Prothrombin Time: 36.2 seconds — ABNORMAL HIGH (ref 11.4–15.2)

## 2020-10-14 LAB — GLUCOSE, CAPILLARY: Glucose-Capillary: 147 mg/dL — ABNORMAL HIGH (ref 70–99)

## 2020-10-14 LAB — RETICULOCYTES
Immature Retic Fract: 24 % — ABNORMAL HIGH (ref 2.3–15.9)
RBC.: 2.75 MIL/uL — ABNORMAL LOW (ref 4.22–5.81)
Retic Count, Absolute: 66.8 10*3/uL (ref 19.0–186.0)
Retic Ct Pct: 2.4 % (ref 0.4–3.1)

## 2020-10-14 LAB — CBG MONITORING, ED: Glucose-Capillary: 102 mg/dL — ABNORMAL HIGH (ref 70–99)

## 2020-10-14 LAB — LACTIC ACID, PLASMA: Lactic Acid, Venous: 1.4 mmol/L (ref 0.5–1.9)

## 2020-10-14 LAB — IRON AND TIBC
Iron: 15 ug/dL — ABNORMAL LOW (ref 45–182)
Saturation Ratios: 3 % — ABNORMAL LOW (ref 17.9–39.5)
TIBC: 476 ug/dL — ABNORMAL HIGH (ref 250–450)
UIBC: 461 ug/dL

## 2020-10-14 LAB — TROPONIN I (HIGH SENSITIVITY): Troponin I (High Sensitivity): 15 ng/L (ref <18)

## 2020-10-14 LAB — FERRITIN: Ferritin: 25 ng/mL (ref 24–336)

## 2020-10-14 LAB — HEMOGLOBIN A1C
Hgb A1c MFr Bld: 7.1 % — ABNORMAL HIGH (ref 4.8–5.6)
Mean Plasma Glucose: 157.07 mg/dL

## 2020-10-14 LAB — FOLATE: Folate: 8.6 ng/mL (ref >5.9)

## 2020-10-14 LAB — VITAMIN B12: Vitamin B-12: 691 pg/mL (ref 180–914)

## 2020-10-14 LAB — BRAIN NATRIURETIC PEPTIDE: B Natriuretic Peptide: 340 pg/mL — ABNORMAL HIGH (ref 0.0–100.0)

## 2020-10-14 LAB — RESP PANEL BY RT-PCR (FLU A&B, COVID) ARPGX2
Influenza A by PCR: NEGATIVE
Influenza B by PCR: NEGATIVE
SARS Coronavirus 2 by RT PCR: NEGATIVE

## 2020-10-14 LAB — POC OCCULT BLOOD, ED: Fecal Occult Bld: POSITIVE — AB

## 2020-10-14 LAB — LIPASE, BLOOD: Lipase: 44 U/L (ref 11–51)

## 2020-10-14 MED ORDER — ACETAMINOPHEN 325 MG PO TABS
650.0000 mg | ORAL_TABLET | Freq: Four times a day (QID) | ORAL | Status: DC | PRN
  Administered 2020-10-14 – 2020-10-16 (×2): 650 mg via ORAL
  Filled 2020-10-14 (×3): qty 2

## 2020-10-14 MED ORDER — ALLOPURINOL 100 MG PO TABS
300.0000 mg | ORAL_TABLET | Freq: Every day | ORAL | Status: DC
  Administered 2020-10-14 – 2020-10-24 (×10): 300 mg via ORAL
  Filled 2020-10-14 (×2): qty 3
  Filled 2020-10-14: qty 1
  Filled 2020-10-14 (×8): qty 3

## 2020-10-14 MED ORDER — INSULIN GLARGINE-YFGN 100 UNIT/ML ~~LOC~~ SOLN
10.0000 [IU] | Freq: Every day | SUBCUTANEOUS | Status: DC
  Administered 2020-10-14 – 2020-10-20 (×7): 10 [IU] via SUBCUTANEOUS
  Filled 2020-10-14 (×8): qty 0.1

## 2020-10-14 MED ORDER — PANTOPRAZOLE SODIUM 40 MG IV SOLR
40.0000 mg | Freq: Two times a day (BID) | INTRAVENOUS | Status: DC
  Administered 2020-10-14 – 2020-10-19 (×9): 40 mg via INTRAVENOUS
  Filled 2020-10-14 (×10): qty 40

## 2020-10-14 MED ORDER — ONDANSETRON HCL 4 MG PO TABS: 4.0000 mg | ORAL_TABLET | Freq: Four times a day (QID) | ORAL | Status: DC | PRN

## 2020-10-14 MED ORDER — INSULIN GLARGINE 100 UNIT/ML ~~LOC~~ SOLN
10.0000 [IU] | Freq: Every day | SUBCUTANEOUS | Status: DC
  Filled 2020-10-14: qty 0.1

## 2020-10-14 MED ORDER — ALBUTEROL SULFATE (2.5 MG/3ML) 0.083% IN NEBU: 2.5000 mg | INHALATION_SOLUTION | RESPIRATORY_TRACT | Status: DC | PRN

## 2020-10-14 MED ORDER — FENTANYL CITRATE PF 50 MCG/ML IJ SOSY: 12.5000 ug | PREFILLED_SYRINGE | INTRAMUSCULAR | Status: DC | PRN

## 2020-10-14 MED ORDER — METOPROLOL SUCCINATE ER 50 MG PO TB24
75.0000 mg | ORAL_TABLET | Freq: Every day | ORAL | Status: DC
  Administered 2020-10-15 – 2020-10-19 (×3): 75 mg via ORAL
  Filled 2020-10-14 (×7): qty 1

## 2020-10-14 MED ORDER — BISACODYL 5 MG PO TBEC: 5.0000 mg | DELAYED_RELEASE_TABLET | Freq: Every day | ORAL | Status: DC | PRN

## 2020-10-14 MED ORDER — TRAZODONE HCL 50 MG PO TABS
25.0000 mg | ORAL_TABLET | Freq: Every evening | ORAL | Status: DC | PRN
  Administered 2020-10-16: 25 mg via ORAL
  Filled 2020-10-14: qty 1

## 2020-10-14 MED ORDER — FERROUS SULFATE 325 (65 FE) MG PO TABS
325.0000 mg | ORAL_TABLET | Freq: Every day | ORAL | Status: DC
  Administered 2020-10-15 – 2020-10-24 (×8): 325 mg via ORAL
  Filled 2020-10-14 (×10): qty 1

## 2020-10-14 MED ORDER — FUROSEMIDE 10 MG/ML IJ SOLN
40.0000 mg | Freq: Two times a day (BID) | INTRAMUSCULAR | Status: DC
  Administered 2020-10-14 – 2020-10-18 (×8): 40 mg via INTRAVENOUS
  Filled 2020-10-14 (×8): qty 4

## 2020-10-14 MED ORDER — ONDANSETRON HCL 4 MG/2ML IJ SOLN: 4.0000 mg | Freq: Four times a day (QID) | INTRAMUSCULAR | Status: DC | PRN

## 2020-10-14 MED ORDER — INSULIN ASPART 100 UNIT/ML IJ SOLN
0.0000 [IU] | Freq: Three times a day (TID) | INTRAMUSCULAR | Status: DC
Start: 2020-10-14 — End: 2020-10-24
  Administered 2020-10-15 (×2): 1 [IU] via SUBCUTANEOUS
  Administered 2020-10-16: 0 [IU] via SUBCUTANEOUS
  Administered 2020-10-16 – 2020-10-19 (×4): 1 [IU] via SUBCUTANEOUS
  Administered 2020-10-19 (×2): 2 [IU] via SUBCUTANEOUS
  Administered 2020-10-20 – 2020-10-24 (×6): 1 [IU] via SUBCUTANEOUS

## 2020-10-14 MED ORDER — SILDENAFIL CITRATE 20 MG PO TABS
20.0000 mg | ORAL_TABLET | Freq: Three times a day (TID) | ORAL | Status: DC
  Administered 2020-10-14 – 2020-10-24 (×28): 20 mg via ORAL
  Filled 2020-10-14 (×35): qty 1

## 2020-10-14 MED ORDER — ACETAMINOPHEN 650 MG RE SUPP: 650.0000 mg | Freq: Four times a day (QID) | RECTAL | Status: DC | PRN

## 2020-10-14 MED ORDER — PHYTONADIONE 5 MG PO TABS
5.0000 mg | ORAL_TABLET | Freq: Once | ORAL | Status: AC
  Administered 2020-10-14: 5 mg via ORAL
  Filled 2020-10-14: qty 1

## 2020-10-14 MED ORDER — PANTOPRAZOLE SODIUM 40 MG IV SOLR
40.0000 mg | Freq: Once | INTRAVENOUS | Status: AC
  Administered 2020-10-14: 40 mg via INTRAVENOUS
  Filled 2020-10-14: qty 40

## 2020-10-14 MED ORDER — ATORVASTATIN CALCIUM 20 MG PO TABS
20.0000 mg | ORAL_TABLET | Freq: Every evening | ORAL | Status: DC
  Administered 2020-10-15 – 2020-10-23 (×9): 20 mg via ORAL
  Filled 2020-10-14 (×9): qty 1
  Filled 2020-10-14: qty 2

## 2020-10-14 MED ORDER — POTASSIUM CHLORIDE CRYS ER 10 MEQ PO TBCR
10.0000 meq | EXTENDED_RELEASE_TABLET | Freq: Every day | ORAL | Status: DC
  Administered 2020-10-14 – 2020-10-20 (×6): 10 meq via ORAL
  Filled 2020-10-14 (×7): qty 1

## 2020-10-14 NOTE — ED Provider Notes (Signed)
[facility] EMERGENCY DEPARTMENT Provider Note   CSN: [id number] Arrival date & time: 10/14/20  1027     History Chief Complaint  Patient presents with   Gastroesophageal Reflux    William Flores is a 70 y.o. male.  He has a history of A. fib on anticoagulation cardiomyopathy MI pulmonary hypertension and is on 2 L nasal cannula 24/7.  He is complaining of indigestion x3 days, heartburn, belching, nausea.  He also feels his abdomen is more distended and sore.  Has tried nothing for his symptoms.  Shortness of breath at baseline.  Denies any fevers or chills, no chest pain.  The history is provided by the patient.  Gastroesophageal Reflux This is a new problem. The current episode started more than 2 days ago. The problem occurs constantly. The problem has not changed since onset.Associated symptoms include abdominal pain and shortness of breath. Pertinent negatives include no chest pain and no headaches. Exacerbated by: Belching. Nothing relieves the symptoms. He has tried nothing for the symptoms. The treatment provided no relief.      Past Medical History:  Diagnosis Date   CAD (coronary artery disease)    LHC 2/08:  mRCA 50%, o/w no sig CAD, EF 25%   Cardiomyopathy ([place])    EF 35-40% in 2/16 in setting of AF with RVR >> echo 5/16 with improved LVF with EF 65-70%   Chronic atrial fibrillation (HCC)    Chronic diastolic heart failure (HCC)    HFPF - EF ~65-70% (OFTEN EXACERBATED BY AFIB)   CKD (chronic kidney disease)    hx of worsening renal failure and hyperK+ in setting of acute diast CHF >> required CVVHD in 4/16 and dialysis x 1 in 5/16   CKD stage 3 due to type 2 diabetes mellitus ([place]) 09/17/2015   Colon cancer ([place])    Colon polyps 09/21/2012   Tubular adenoma   Diabetes ([place])    Hyperlipidemia    Hypertension    Obesity hypoventilation syndrome ([place])    Renal insufficiency    Sleep apnea     Patient Active Problem List   Diagnosis Date Noted   Anemia, iron  deficiency 07/21/2020   Chronic gastritis without bleeding    AKI (acute kidney injury) ([place])    Syncope and collapse    Supratherapeutic INR    Syncope 06/19/2020   Hypokalemia 06/19/2020   Fecal occult blood test positive 06/19/2020   Cor pulmonale, acute ([place]) 04/15/2019   Bilateral leg edema    Chronic renal failure    Pulmonary hypertension ([place]) 12/28/2018   Hyperlipidemia associated with type 2 diabetes mellitus ([place]) 05/18/2017   Morbid obesity with BMI of 45.0-49.9, adult ([place]) 05/18/2017   CKD stage 3 due to type 2 diabetes mellitus ([place]) 09/17/2015   MGUS (monoclonal gammopathy of unknown significance) 05/27/2015   Normocytic anemia 11/04/2014   Peripheral edema    Essential hypertension    DM (diabetes mellitus), type 2, uncontrolled, with renal complications 50/93/2671   Atrial fibrillation with slow ventricular response ([place]) 04/14/2014   Anticoagulated on Coumadin 03/20/2014   Nonischemic cardiomyopathy ([place]) 02/20/2014   OSA (obstructive sleep apnea)    Right heart failure ([place]) 02/10/2014   Morbid obesity ([place]) 08/05/2013   History of colon cancer    Gout 05/25/2012   Hypertensive heart disease     Past Surgical History:  Procedure Laterality Date   CIRCUMCISION     COLON RESECTION     COLONOSCOPY N/A 09/21/2012   Procedure: COLONOSCOPY;  Surgeon: Inda Castle, MD;  Location: [facility];  Service: Endoscopy;  Laterality: N/A;   COLONOSCOPY N/A 12/28/2018   Procedure: COLONOSCOPY;  Surgeon: Rogene Houston, MD;  Location[facility];  Service: Endoscopy;  Laterality: N/A;   ENTEROSCOPY N/A 06/24/2020   Procedure: ENTEROSCOPY;  Surgeon: Gatha Mayer, MD;  Location: [facility] ENDOSCOPY;  Service: Endoscopy;  Laterality: N/A;   ESOPHAGOGASTRODUODENOSCOPY N/A 12/28/2018   Procedure: ESOPHAGOGASTRODUODENOSCOPY (EGD);  Surgeon: Rogene Houston, MD;  Location: [facility];  Service: Endoscopy;  Laterality: N/A;   RIGHT HEART CATH N/A 04/11/2019   Procedure:  RIGHT HEART CATH;  Surgeon: Larey Dresser, MD;  Location: [facility];  Service: Cardiovascular;  Laterality: N/A;   US ECHOCARDIOGRAPHY  03/04/2010   abnormal study       Family History  Problem Relation Age of Onset   Breast cancer Mother    Diabetes Mother    Heart attack Father     Social History   Tobacco Use   Smoking status: Former    Types: Cigarettes    Quit date: 08/06/1983    Years since quitting: 37.2   Smokeless tobacco: Never  Substance Use Topics   Alcohol use: No   Drug use: No    Home Medications Prior to Admission medications   Medication Sig Start Date End Date Taking? Authorizing Provider  KLOR-CON M20 20 MEQ tablet TAKE 1/2 TABLET (10 MEQ TOTAL) BY MOUTH DAILY. NEED OFFICE VISIT 09/15/20   [name], [name], MD  acetaminophen (TYLENOL) 325 MG tablet Take 2 tablets (650 mg total) by mouth every 6 (six) hours as needed for mild pain (or Fever >/= 101). 01/01/19   Roxan Hockey, MD  allopurinol (ZYLOPRIM) 300 MG tablet Take 1 tablet (300 mg total) by mouth daily. 12/20/19   [name], [name], MD  atorvastatin (LIPITOR) 20 MG tablet TAKE 1 TABLET BY MOUTH EVERY DAY 03/19/20   [name], [name], MD  azelastine (OPTIVAR) 0.05 % ophthalmic solution 1 drop 2 (two) times daily. Give 2 drops in the left eye bid for allergic itching eyes    [provider]  benzonatate (TESSALON) 100 MG capsule Take 1 capsule (100 mg total) by mouth 2 (two) times daily as needed for cough. 06/25/20   Patrecia Pour, MD  Dulaglutide (TRULICITY) 1.5 HY/8.5OY SOPN Inject 1.5 mg into the skin once a week. 12/20/19   [name], [name], MD  FARXIGA 5 MG TABS tablet Take 5 mg by mouth daily. 01/26/19   [provider]  ferrous sulfate 325 (65 FE) MG tablet Take 325 mg by mouth daily with breakfast.     [provider]  gentamicin (GARAMYCIN) 0.3 % ophthalmic ointment 3 (three) times daily.    [provider]  glucose blood (CONTOUR NEXT TEST)  test strip Test BS TID Dx E11.29 05/22/20   [name], [name], MD  Insulin Glargine (LANTUS SOLOSTAR [place]) Inject into the skin. 16 units    [provider]  Insulin Pen Needle 32G X 4 MM MISC Use to give insulin daily Dx E11.29 04/15/19   Charlynne Cousins, MD  metoprolol succinate (TOPROL-XL) 50 MG 24 hr tablet Take 1.5 tablets (75 mg total) by mouth daily. Take with or immediately following a meal. 09/10/20   Larey Dresser, MD  pantoprazole (PROTONIX) 40 MG tablet TAKE 1 TABLET BY MOUTH EVERY DAY 06/02/20   [name], [name], MD  polyethylene glycol (MIRALAX / GLYCOLAX) 17 g packet  Take 17 g by mouth daily. 01/01/19   Roxan Hockey, MD  sildenafil (REVATIO) 20 MG tablet Take 1 tablet (20 mg total) by mouth 3 (three) times daily. 08/24/20   Larey Dresser, MD  torsemide (DEMADEX) 20 MG tablet Take 3 tablets (60 mg total) by mouth 2 (two) times daily. 09/29/20   Larey Dresser, MD  warfarin (COUMADIN) 7.5 MG tablet Take 7.5 mg by mouth daily.    [provider]  diltiazem [facility]) 240 MG 24 hr capsule TAKE 1 CAPSULE DAILY 10/19/12 02/17/13  Vernie Shanks, MD    Allergies    Codeine  Review of Systems   Review of Systems  Constitutional:  Negative for fever.  HENT:  Negative for sore throat.   Eyes:  Negative for visual disturbance.  Respiratory:  Positive for shortness of breath.   Cardiovascular:  Positive for leg swelling. Negative for chest pain.  Gastrointestinal:  Positive for abdominal distention, abdominal pain and nausea. Negative for constipation, diarrhea and vomiting.  Genitourinary:  Negative for dysuria.  Musculoskeletal:  Negative for neck pain.  Skin:  Negative for rash.  Neurological:  Negative for headaches.   Physical Exam Updated Vital Signs BP 127/71   Pulse 90   Temp 98.4 F (36.9 C) (Oral)   Resp (!) 21   Ht 5' 4"  (1.626 m)   Wt 121.6 kg   SpO2 98%   BMI 46.02 kg/m   Physical Exam Vitals and nursing note reviewed.   Constitutional:      General: He is not in acute distress.    Appearance: Normal appearance. He is well-developed. He is obese.  HENT:     Head: Normocephalic and atraumatic.  Eyes:     Conjunctiva/sclera: Conjunctivae normal.  Cardiovascular:     Rate and Rhythm: Normal rate. Rhythm irregular.     Heart sounds: No murmur heard. Pulmonary:     Effort: Pulmonary effort is normal. No respiratory distress.     Breath sounds: Normal breath sounds.  Abdominal:     General: There is distension.     Palpations: Abdomen is soft.     Tenderness: There is no abdominal tenderness. There is no guarding or rebound.     Hernia: A hernia (umbilical) is present.     Comments: Patient has thickening and rugae of the skin of his lower abdomen  Musculoskeletal:        General: No tenderness.     Cervical back: Neck supple.     Right lower leg: Edema present.     Left lower leg: Edema present.  Skin:    General: Skin is warm and dry.  Neurological:     General: No focal deficit present.     Mental Status: He is alert.    ED Results / Procedures / Treatments   Labs (all labs ordered are listed, but only abnormal results are displayed) Labs Reviewed  COMPREHENSIVE METABOLIC PANEL - Abnormal; Notable for the following components:      Result Value   Glucose, Bld 151 (*)    BUN 78 (*)    Creatinine, Ser 2.68 (*)    Calcium 8.6 (*)    GFR, Estimated 25 (*)    All other components within normal limits  BRAIN NATRIURETIC PEPTIDE - Abnormal; Notable for the following components:   B Natriuretic Peptide 340.0 (*)    All other components within normal limits  CBC WITH DIFFERENTIAL/PLATELET - Abnormal; Notable for the following components:   RBC  2.89 (*)    Hemoglobin 7.1 (*)    HCT 25.8 (*)    MCH 24.6 (*)    MCHC 27.5 (*)    RDW 19.4 (*)    nRBC 0.8 (*)    All other components within normal limits  PROTIME-INR - Abnormal; Notable for the following components:   Prothrombin Time 36.2 (*)     INR 3.6 (*)    All other components within normal limits  IRON AND TIBC - Abnormal; Notable for the following components:   Iron 15 (*)    TIBC 476 (*)    Saturation Ratios 3 (*)    All other components within normal limits  RETICULOCYTES - Abnormal; Notable for the following components:   RBC. 2.75 (*)    Immature Retic Fract 24.0 (*)    All other components within normal limits  POC OCCULT BLOOD, ED - Abnormal; Notable for the following components:   Fecal Occult Bld POSITIVE (*)    All other components within normal limits  RESP PANEL BY RT-PCR (FLU A&B, COVID) ARPGX2  LIPASE, BLOOD  LACTIC ACID, PLASMA  VITAMIN B12  FOLATE  FERRITIN  HEMOGLOBIN C9O  BASIC METABOLIC PANEL  CBC  PROTIME-INR  BRAIN NATRIURETIC PEPTIDE  CERULOPLASMIN  HEPATITIS PANEL, ACUTE  AFP TUMOR MARKER  ANTINUCLEAR ANTIBODIES, IFA  ANTI-SMOOTH MUSCLE ANTIBODY, IGG  MITOCHONDRIAL ANTIBODIES  ALPHA-1-ANTITRYPSIN  IGG, IGA, IGM  TROPONIN I (HIGH SENSITIVITY)    EKG EKG Interpretation  Date/Time:  Wednesday October 14 2020 10:35:28 EDT Ventricular Rate:  89 PR Interval:    QRS Duration: 104 QT Interval:  391 QTC Calculation: 476 R Axis:   115 Text Interpretation: Right and left arm electrode reversal, interpretation assumes no reversal Atrial fibrillation Probable lateral infarct, age indeterminate Anteroseptal infarct, old No significant change since prior 6/22 Confirmed by Aletta Edouard [phone]) on 10/14/2020 10:38:23 AM  Radiology CT Abdomen Pelvis Wo Contrast  Result Date: 10/14/2020 CLINICAL DATA:  Evaluate for infection EXAM: CT CHEST, ABDOMEN AND PELVIS WITHOUT CONTRAST TECHNIQUE: Multidetector CT imaging of the chest, abdomen and pelvis was performed following the standard protocol without IV contrast. COMPARISON:  CT abdomen and pelvis dated December 26, 2018 FINDINGS: CT CHEST FINDINGS Cardiovascular: Cardiomegaly. No pericardial effusion. Three-vessel coronary artery  calcifications. Mitral annular calcifications. Atherosclerotic disease of the thoracic aorta. Mediastinum/Nodes: Mildly enlarged mediastinal lymph nodes which are likely reactive. Reference right pulmonary ligament lymph node measuring 1.4 cm in short axis on series 2, image 31. Esophagus and thyroid are unremarkable. Lungs/Pleura: Central airways are patent. Small bilateral pleural effusions and atelectasis. Ground-glass opacities of the bilateral upper lungs. Solid pulmonary nodule of the right lower lobe measuring 3 mm in mean diameter on series 3, image 78. Musculoskeletal: No chest wall mass or suspicious bone lesions identified. CT ABDOMEN PELVIS FINDINGS Hepatobiliary: Nodular liver contour. Gallbladder is unremarkable. No biliary ductal dilation. Pancreas: Mild peripancreatic fat stranding, likely due to volume status given normal serum lipase. Otherwise unremarkable. Spleen: Unremarkable. Adrenals/Urinary Tract: Bilateral adrenal glands are unremarkable. Cortical thinning of the bilateral kidneys. No evidence of hydronephrosis or nephrolithiasis. Distended urinary bladder. Stomach/Bowel: Stomach is within normal limits. Prior right hemi colectomy. No evidence of bowel wall thickening, distention, or inflammatory changes. Vascular/Lymphatic: Aortic atherosclerosis. No enlarged abdominal or pelvic lymph nodes. Reproductive: Prostate is unremarkable. Other: Small volume abdominal ascites. Diffuse soft tissue anasarca. Musculoskeletal: No acute or significant osseous findings. IMPRESSION: Mild bilateral ground-glass opacities which are most prominent in the upper lungs, favor pulmonary edema, although  an infectious process could appear similar. Small bilateral pleural effusions, small volume abdominal ascites, and soft tissue anasarca, likely due to volume status. Nodular liver contour, suggestive of cirrhosis. Solid pulmonary nodule the right lower lobe measuring 3 mm. No follow-up needed if patient is  low-risk. Non-contrast chest CT can be considered in 12 months if patient is high-risk. This recommendation follows the consensus statement: Guidelines for Management of Incidental Pulmonary Nodules Detected on CT Images: From the Fleischner Society 2017; Radiology 2017; 284:228-243. Aortic Atherosclerosis (ICD10-I70.0). Electronically Signed   By: Yetta Glassman M.D.   On: 10/14/2020 13:11   CT Chest Wo Contrast  Result Date: 10/14/2020 CLINICAL DATA:  Evaluate for infection EXAM: CT CHEST, ABDOMEN AND PELVIS WITHOUT CONTRAST TECHNIQUE: Multidetector CT imaging of the chest, abdomen and pelvis was performed following the standard protocol without IV contrast. COMPARISON:  CT abdomen and pelvis dated December 26, 2018 FINDINGS: CT CHEST FINDINGS Cardiovascular: Cardiomegaly. No pericardial effusion. Three-vessel coronary artery calcifications. Mitral annular calcifications. Atherosclerotic disease of the thoracic aorta. Mediastinum/Nodes: Mildly enlarged mediastinal lymph nodes which are likely reactive. Reference right pulmonary ligament lymph node measuring 1.4 cm in short axis on series 2, image 31. Esophagus and thyroid are unremarkable. Lungs/Pleura: Central airways are patent. Small bilateral pleural effusions and atelectasis. Ground-glass opacities of the bilateral upper lungs. Solid pulmonary nodule of the right lower lobe measuring 3 mm in mean diameter on series 3, image 78. Musculoskeletal: No chest wall mass or suspicious bone lesions identified. CT ABDOMEN PELVIS FINDINGS Hepatobiliary: Nodular liver contour. Gallbladder is unremarkable. No biliary ductal dilation. Pancreas: Mild peripancreatic fat stranding, likely due to volume status given normal serum lipase. Otherwise unremarkable. Spleen: Unremarkable. Adrenals/Urinary Tract: Bilateral adrenal glands are unremarkable. Cortical thinning of the bilateral kidneys. No evidence of hydronephrosis or nephrolithiasis. Distended urinary bladder.  Stomach/Bowel: Stomach is within normal limits. Prior right hemi colectomy. No evidence of bowel wall thickening, distention, or inflammatory changes. Vascular/Lymphatic: Aortic atherosclerosis. No enlarged abdominal or pelvic lymph nodes. Reproductive: Prostate is unremarkable. Other: Small volume abdominal ascites. Diffuse soft tissue anasarca. Musculoskeletal: No acute or significant osseous findings. IMPRESSION: Mild bilateral ground-glass opacities which are most prominent in the upper lungs, favor pulmonary edema, although an infectious process could appear similar. Small bilateral pleural effusions, small volume abdominal ascites, and soft tissue anasarca, likely due to volume status. Nodular liver contour, suggestive of cirrhosis. Solid pulmonary nodule the right lower lobe measuring 3 mm. No follow-up needed if patient is low-risk. Non-contrast chest CT can be considered in 12 months if patient is high-risk. This recommendation follows the consensus statement: Guidelines for Management of Incidental Pulmonary Nodules Detected on CT Images: From the Fleischner Society 2017; Radiology 2017; 284:228-243. Aortic Atherosclerosis (ICD10-I70.0). Electronically Signed   By: Yetta Glassman M.D.   On: 10/14/2020 13:11   DG Chest Port 1 View  Result Date: 10/14/2020 CLINICAL DATA:  Cough and chest pain. EXAM: PORTABLE CHEST 1 VIEW COMPARISON:  06/24/2020 FINDINGS: Stable cardiac enlargement. Aortic atherosclerosis. Central pulmonary vascular congestion is noted as before. New asymmetric airspace opacity is identified within the right upper lobe. Chronic pleural thickening along the lateral left base is again seen. IMPRESSION: 1. New right upper lobe airspace opacity compatible with pneumonia. 2. Cardiac enlargement and pulmonary vascular congestion. Electronically Signed   By: Kerby Moors M.D.   On: 10/14/2020 11:34   [place] ASCITES (ABDOMEN LIMITED)  Result Date: 10/14/2020 CLINICAL DATA:  Abdominal  distension question ascites. History CHF, cardiomyopathy, coronary artery disease, colon cancer,  diabetes mellitus, hypertension EXAM: LIMITED ABDOMEN ULTRASOUND FOR ASCITES TECHNIQUE: Limited ultrasound survey for ascites was performed in all four abdominal quadrants. COMPARISON:  CT abdomen and pelvis 10/14/2020 FINDINGS: Small collection of ascites in LEFT lower quadrant, containing bowel loops. No other significant ascites seen. Volume of ascites is insufficient for paracentesis. IMPRESSION: Low volume ascites insufficient for paracentesis. Electronically Signed   By: Lavonia Dana M.D.   On: 10/14/2020 15:49    Procedures Procedures   Medications Ordered in ED Medications  insulin aspart (novoLOG) injection 0-9 Units (has no administration in time range)  furosemide (LASIX) injection 40 mg (has no administration in time range)  pantoprazole (PROTONIX) injection 40 mg (has no administration in time range)  acetaminophen (TYLENOL) tablet 650 mg (has no administration in time range)    Or  acetaminophen (TYLENOL) suppository 650 mg (has no administration in time range)  fentaNYL (SUBLIMAZE) injection 12.5-50 mcg (has no administration in time range)  traZODone (DESYREL) tablet 25 mg (has no administration in time range)  ondansetron (ZOFRAN) tablet 4 mg (has no administration in time range)    Or  ondansetron (ZOFRAN) injection 4 mg (has no administration in time range)  bisacodyl (DULCOLAX) EC tablet 5 mg (has no administration in time range)  albuterol (PROVENTIL) (2.5 MG/3ML) 0.083% nebulizer solution 2.5 mg (has no administration in time range)  allopurinol (ZYLOPRIM) tablet 300 mg (300 mg Oral Given 10/14/20 1544)  atorvastatin (LIPITOR) tablet 20 mg (has no administration in time range)  ferrous sulfate tablet 325 mg (has no administration in time range)  potassium chloride SA (KLOR-CON) CR tablet 10 mEq (10 mEq Oral Given 10/14/20 1543)  metoprolol succinate (TOPROL-XL) 24 hr tablet 75  mg (has no administration in time range)  sildenafil (REVATIO) tablet 20 mg (has no administration in time range)  insulin glargine-yfgn (SEMGLEE) injection 10 Units (has no administration in time range)  pantoprazole (PROTONIX) injection 40 mg (40 mg Intravenous Given 10/14/20 1434)  phytonadione (VITAMIN K) tablet 5 mg (5 mg Oral Given 10/14/20 1544)    ED Course  I have reviewed the triage vital signs and the nursing notes.  Pertinent labs & imaging results that were available during my care of the patient were reviewed by me and considered in my medical decision making (see chart for details).  Clinical Course as of 10/14/20 1836  Wed Oct 14, 2020  1117 Chest x-ray ordered and interpreted by me as cardiomegaly diffuse interstitial markings.  Awaiting radiology reading. [MB]  1331 Rectal exam done with Deanna as chaperone.  Normal tone brown stool.  Sent to lab for guaiac [MB]  1350 Discussed with Dr. Laural Golden from gastroenterology who will see in consult. [MB]  1406 Discussed with Dr. Wynetta Emery [facility] hospital[facility] who will evaluate the patient for admission. [MB]    Clinical Course User Index [MB] Hayden Rasmussen, MD   MDM Rules/Calculators/A&P                          This patient complains of abdominal pain and distention, shortness of breath indigestion; this involves an extensive number of treatment Options and is a complaint that carries with it a high risk of complications and Morbidity. The differential includes reflux, pulmonary edema, CHF, intra-abdominal disease ascites, anasarca liver failure, renal failure  I ordered, reviewed and interpreted labs, which included CBC with normal white count, hemoglobin lower than priors, chemistries with worsening creatinine, INR supratherapeutic, lactate normal, BNP elevated, troponins flat, COVID  and flu negative, fecal occult positive I ordered medication IV Protonix I ordered imaging studies which included chest x-ray, CT chest abdomen  and pelvis and I independently    visualized and interpreted imaging which showed groundglass opacities in the lungs concerning for fluid versus infection, nodular liver, small volume ascites, anasarca Additional history obtained from EMS Previous records obtained and reviewed in epic, few months past was admitted for syncope and required blood transfusion I consulted Dr. Laural Golden gastroenterology and Dr. Wynetta Emery [facility] and discussed lab and imaging findings  Critical Interventions: None  After the interventions stated above, I reevaluated the patient and found patient still to be symptomatic.  Will need admission to the hospital for further work-up.  Patient in agreement with plan.   Final Clinical Impression(s) / ED Diagnoses Final diagnoses:  Symptomatic anemia  Acute on chronic congestive heart failure, unspecified heart failure type [facility])  Gastrointestinal hemorrhage, unspecified gastrointestinal hemorrhage type    Rx / DC Orders ED Discharge Orders     None        Hayden Rasmussen, MD 10/14/20 1840

## 2020-10-14 NOTE — ED Triage Notes (Signed)
Pt arrives via [facility] d/t indigestion x3 days. Also states his "stomach veins are swollen". Hx of hernia and a tumor in abdomen removed. Supposed to f/u with colonoscopy on 11/10.

## 2020-10-14 NOTE — Consult Note (Addendum)
Referring Provider: No ref. provider found Primary Care Physician:  [name], [name], MD Primary Gastroenterologist:  Dr. Jenetta Downer  Date of Admission: 10/14/20 Date of Consultation: 10/14/20  Reason for Consultation:  anemia  HPI:  William Flores is a 70 y.o. year old male with history of colon cancer s/p resection, a fib on chronic anticoagulation (coumadin), IDA, cardiomyopathy, MI, pulmonary htn and is on chronic 2L O2. Patient presented to ED with c/o indigestion x3 days as well as belching, nausea and abdominal distention with soreness. He had not tried anything at home for his symptoms. He reported sob at baseline, denies fevers, chills or vomiting.   ED Course:hgb 7.1, INR 3.6, FOBT positive, folate and B12 WNL, Iron 15, TIBC 476 and saturation 3, ferritin 25. [place] Ascites showed low volume ascites, insufficient for paracentesis. CT A/P w/o contrast concerning for cirrhosis. LFTs WNL.   Consult: Patient is alert and oriented upon my assessment. Reports he started noticing swelling to his abdomen for the past 4-5 days with some abdominal "soreness". He reports he has had abdominal swelling a few times before in the past, states It was very bad once when he had carbon monoxide poisoning a while back. He reports that he has chronic LE edema. He also endorses heartburn, nausea and belching but no episodes of vomiting. He reports some occasional diarrhea. He is unsure of blood in his stools or black stools. He does endorse more fatigue recently. Denies worsening SOB or dizziness. He denies any weight loss, does endorse some decrease in his appetite since his belly began swelling, he denies any episodes of confusion. He reports blood transfusion in the past when his hemoglobin got very low though he is unsure if he had rectal bleeding at that time or not.   Denies recent alcohol use in many years.  Denies NSAID use.    Past Medical History:  Diagnosis Date   CAD (coronary artery disease)     LHC 2/08:  mRCA 50%, o/w no sig CAD, EF 25%   Cardiomyopathy ([place])    EF 35-40% in 2/16 in setting of AF with RVR >> echo 5/16 with improved LVF with EF 65-70%   Chronic atrial fibrillation (HCC)    Chronic diastolic heart failure (HCC)    HFPF - EF ~65-70% (OFTEN EXACERBATED BY AFIB)   CKD (chronic kidney disease)    hx of worsening renal failure and hyperK+ in setting of acute diast CHF >> required CVVHD in 4/16 and dialysis x 1 in 5/16   CKD stage 3 due to type 2 diabetes mellitus ([place]) 09/17/2015   Colon cancer ([place])    Colon polyps 09/21/2012   Tubular adenoma   Diabetes ([place])    Hyperlipidemia    Hypertension    Obesity hypoventilation syndrome ([place])    Renal insufficiency    Sleep apnea     Past Surgical History:  Procedure Laterality Date   CIRCUMCISION     COLON RESECTION     COLONOSCOPY N/A 09/21/2012   Procedure: COLONOSCOPY;  Surgeon: Inda Castle, MD;  Location: [facility];  Service: Endoscopy;  Laterality: N/A;   COLONOSCOPY N/A 12/28/2018   Procedure: COLONOSCOPY;  Surgeon: Rogene Houston, MD;  Location: [facility];  Service: Endoscopy;  Laterality: N/A;   ENTEROSCOPY N/A 06/24/2020   Procedure: ENTEROSCOPY;  Surgeon: Gatha Mayer, MD;  Location: [facility] ENDOSCOPY;  Service: Endoscopy;  Laterality: N/A;   ESOPHAGOGASTRODUODENOSCOPY N/A 12/28/2018   Procedure: ESOPHAGOGASTRODUODENOSCOPY (EGD);  Surgeon: Laural Golden,  Mechele Dawley, MD;  Location: [facility];  Service: Endoscopy;  Laterality: N/A;   RIGHT HEART CATH N/A 04/11/2019   Procedure: RIGHT HEART CATH;  Surgeon: Larey Dresser, MD;  Location: [facility];  Service: Cardiovascular;  Laterality: N/A;   US ECHOCARDIOGRAPHY  03/04/2010   abnormal study    Prior to Admission medications   Medication Sig Start Date End Date Taking? Authorizing Provider  acetaminophen (TYLENOL) 325 MG tablet Take 2 tablets (650 mg total) by mouth every 6 (six) hours as needed for mild pain (or Fever >/= 101). 01/01/19   Yes Emokpae, Courage, MD  allopurinol (ZYLOPRIM) 300 MG tablet Take 1 tablet (300 mg total) by mouth daily. 12/20/19  Yes [name], [name], MD  atorvastatin (LIPITOR) 20 MG tablet TAKE 1 TABLET BY MOUTH EVERY DAY Patient taking differently: Take 20 mg by mouth daily. 03/19/20  Yes [name], [name], MD  benzonatate (TESSALON) 100 MG capsule Take 1 capsule (100 mg total) by mouth 2 (two) times daily as needed for cough. 06/25/20  Yes Patrecia Pour, MD  Dulaglutide (TRULICITY) 1.5 YQ/8.2NO SOPN Inject 1.5 mg into the skin once a week. 12/20/19  Yes [name], [name], MD  FARXIGA 5 MG TABS tablet Take 5 mg by mouth daily. 01/26/19  Yes [provider]  ferrous sulfate 325 (65 FE) MG tablet Take 325 mg by mouth daily with breakfast.    Yes [provider]  Insulin Glargine (LANTUS SOLOSTAR [place]) Inject into the skin. 16 units   Yes [provider]  KLOR-CON M20 20 MEQ tablet TAKE 1/2 TABLET (10 MEQ TOTAL) BY MOUTH DAILY. NEED OFFICE VISIT Patient taking differently: Take 10 mEq by mouth daily. 09/15/20  Yes [name], [name], MD  metoprolol succinate (TOPROL-XL) 50 MG 24 hr tablet Take 1.5 tablets (75 mg total) by mouth daily. Take with or immediately following a meal. 09/10/20  Yes Larey Dresser, MD  pantoprazole (PROTONIX) 40 MG tablet TAKE 1 TABLET BY MOUTH EVERY DAY 06/02/20  Yes [name], [name], MD  polyethylene glycol (MIRALAX / GLYCOLAX) 17 g packet Take 17 g by mouth daily. 01/01/19  Yes Emokpae, Courage, MD  sildenafil (REVATIO) 20 MG tablet Take 1 tablet (20 mg total) by mouth 3 (three) times daily. 08/24/20  Yes Larey Dresser, MD  torsemide (DEMADEX) 20 MG tablet Take 3 tablets (60 mg total) by mouth 2 (two) times daily. 09/29/20  Yes Larey Dresser, MD  warfarin (COUMADIN) 7.5 MG tablet Take 7.5 mg by mouth daily.   Yes [provider]  azelastine (OPTIVAR) 0.05 % ophthalmic solution 1 drop 2 (two) times daily. Give 2 drops in the left eye bid  for allergic itching eyes Patient not taking: No sig reported    [provider]  gentamicin (GARAMYCIN) 0.3 % ophthalmic ointment 3 (three) times daily. Patient not taking: No sig reported    [provider]  glucose blood (CONTOUR NEXT TEST) test strip Test BS TID Dx E11.29 05/22/20   [name], [name], MD  Insulin Pen Needle 32G X 4 MM MISC Use to give insulin daily Dx E11.29 04/15/19   Charlynne Cousins, MD  diltiazem [facility]) 240 MG 24 hr capsule TAKE 1 CAPSULE DAILY 10/19/12 02/17/13  Vernie Shanks, MD    Current Facility-Administered Medications  Medication Dose Route Frequency Provider Last Rate Last Admin   acetaminophen (TYLENOL) tablet 650 mg  650 mg Oral Q6H PRN Murlean Iba, MD  Or   acetaminophen (TYLENOL) suppository 650 mg  650 mg Rectal Q6H PRN Johnson, Clanford L, MD       albuterol (PROVENTIL) (2.5 MG/3ML) 0.083% nebulizer solution 2.5 mg  2.5 mg Nebulization Q2H PRN Johnson, Clanford L, MD       allopurinol (ZYLOPRIM) tablet 300 mg  300 mg Oral Daily Johnson, Clanford L, MD   300 mg at 10/14/20 1544   atorvastatin (LIPITOR) tablet 20 mg  20 mg Oral QPM Johnson, Clanford L, MD       bisacodyl (DULCOLAX) EC tablet 5 mg  5 mg Oral Daily PRN Johnson, Clanford L, MD       fentaNYL (SUBLIMAZE) injection 12.5-50 mcg  12.5-50 mcg Intravenous Q2H PRN Johnson, Clanford L, MD       [START ON 10/15/2020] ferrous sulfate tablet 325 mg  325 mg Oral Q breakfast Johnson, Clanford L, MD       furosemide (LASIX) injection 40 mg  40 mg Intravenous Q12H Johnson, Clanford L, MD       insulin aspart (novoLOG) injection 0-9 Units  0-9 Units Subcutaneous TID WC Johnson, Clanford L, MD       insulin glargine-yfgn (SEMGLEE) injection 10 Units  10 Units Subcutaneous Q2200 Johnson, Clanford L, MD       [START ON 10/15/2020] metoprolol succinate (TOPROL-XL) 24 hr tablet 75 mg  75 mg Oral Daily Johnson, Clanford L, MD       ondansetron (ZOFRAN) tablet 4 mg  4 mg Oral  Q6H PRN Johnson, Clanford L, MD       Or   ondansetron (ZOFRAN) injection 4 mg  4 mg Intravenous Q6H PRN Johnson, Clanford L, MD       pantoprazole (PROTONIX) injection 40 mg  40 mg Intravenous Q12H Johnson, Clanford L, MD       potassium chloride SA (KLOR-CON) CR tablet 10 mEq  10 mEq Oral Daily Johnson, Clanford L, MD   10 mEq at 10/14/20 1543   sildenafil (REVATIO) tablet 20 mg  20 mg Oral TID Wynetta Emery, Clanford L, MD       traZODone (DESYREL) tablet 25 mg  25 mg Oral QHS PRN Johnson, Clanford L, MD       Current Outpatient Medications  Medication Sig Dispense Refill   acetaminophen (TYLENOL) 325 MG tablet Take 2 tablets (650 mg total) by mouth every 6 (six) hours as needed for mild pain (or Fever >/= 101). 12 tablet 0   allopurinol (ZYLOPRIM) 300 MG tablet Take 1 tablet (300 mg total) by mouth daily. 90 tablet 3   atorvastatin (LIPITOR) 20 MG tablet TAKE 1 TABLET BY MOUTH EVERY DAY (Patient taking differently: Take 20 mg by mouth daily.) 90 tablet 3   benzonatate (TESSALON) 100 MG capsule Take 1 capsule (100 mg total) by mouth 2 (two) times daily as needed for cough.     Dulaglutide (TRULICITY) 1.5 PT/4.6FK SOPN Inject 1.5 mg into the skin once a week. 6 mL 3   FARXIGA 5 MG TABS tablet Take 5 mg by mouth daily.     ferrous sulfate 325 (65 FE) MG tablet Take 325 mg by mouth daily with breakfast.      Insulin Glargine (LANTUS SOLOSTAR [place]) Inject into the skin. 16 units     KLOR-CON M20 20 MEQ tablet TAKE 1/2 TABLET (10 MEQ TOTAL) BY MOUTH DAILY. NEED OFFICE VISIT (Patient taking differently: Take 10 mEq by mouth daily.) 45 tablet 0   metoprolol succinate (TOPROL-XL) 50 MG 24 hr tablet  Take 1.5 tablets (75 mg total) by mouth daily. Take with or immediately following a meal. 45 tablet 6   pantoprazole (PROTONIX) 40 MG tablet TAKE 1 TABLET BY MOUTH EVERY DAY 90 tablet 1   polyethylene glycol (MIRALAX / GLYCOLAX) 17 g packet Take 17 g by mouth daily. 14 each 0   sildenafil (REVATIO) 20 MG tablet  Take 1 tablet (20 mg total) by mouth 3 (three) times daily. 90 tablet 11   torsemide (DEMADEX) 20 MG tablet Take 3 tablets (60 mg total) by mouth 2 (two) times daily. 540 tablet 3   warfarin (COUMADIN) 7.5 MG tablet Take 7.5 mg by mouth daily.     azelastine (OPTIVAR) 0.05 % ophthalmic solution 1 drop 2 (two) times daily. Give 2 drops in the left eye bid for allergic itching eyes (Patient not taking: No sig reported)     gentamicin (GARAMYCIN) 0.3 % ophthalmic ointment 3 (three) times daily. (Patient not taking: No sig reported)     glucose blood (CONTOUR NEXT TEST) test strip Test BS TID Dx E11.29 300 strip 3   Insulin Pen Needle 32G X 4 MM MISC Use to give insulin daily Dx E11.29 100 each 3    Allergies as of 10/14/2020 - Review Complete 10/14/2020  Allergen Reaction Noted   Codeine  03/29/2012    Family History  Problem Relation Age of Onset   Breast cancer Mother    Diabetes Mother    Heart attack Father     Social History   Socioeconomic History   Marital status: Divorced    Spouse name: Not on file   Number of children: 1   Years of education: 12   Highest education level: 12th grade  Occupational History    Employer: [employer]  Tobacco Use   Smoking status: Former    Types: Cigarettes    Quit date: 08/06/1983    Years since quitting: 37.2   Smokeless tobacco: Never  Substance and Sexual Activity   Alcohol use: No   Drug use: No   Sexual activity: Not Currently  Other Topics Concern   Not on file  Social History Narrative   Lives alone   Social Determinants of Health   Financial Resource Strain: Low Risk    Difficulty of Paying Living Expenses: Not very hard  Food Insecurity: Not on file  Transportation Needs: Not on file  Physical Activity: Inactive   Days of Exercise per Week: 0 days   Minutes of Exercise per Session: 0 min  Stress: Not on file  Social Connections: Socially Isolated   Frequency of Communication with Friends and Family: More than  three times a week   Frequency of Social Gatherings with Friends and Family: More than three times a week   Attends Religious Services: Never   Marine scientist or Organizations: No   Attends Music therapist: Never   Marital Status: Divorced  Human resources officer Violence: Not on file    Review of Systems: Gen: Denies fever, chills,  change in weight or weight loss +decrease in appetite CV: Denies chest pain, heart palpitations, syncope, edema  Resp: Denies shortness of breath with rest, cough, wheezing GI: Denies dysphagia or odynophagia. Denies vomiting blood, jaundice, and fecal incontinence.  GU : Denies urinary burning, urinary frequency, urinary incontinence.  MS: Denies joint pain,swelling, cramping Derm: Denies rash, itching, dry skin Psych: Denies depression, anxiety,confusion, or memory loss Heme: Denies bruising, bleeding, and enlarged lymph nodes.  Physical Exam: Vital signs  in last 24 hours: Temp:  [98.4 F (36.9 C)] 98.4 F (36.9 C) (10/12 1042) Pulse Rate:  [81-96] 92 (10/12 1430) Resp:  [19-29] 20 (10/12 1430) BP: (101-127)/(69-78) 108/69 (10/12 1430) SpO2:  [94 %-100 %] 99 % (10/12 1430) Weight:  [121.6 kg] 121.6 kg (10/12 1036)   General:   Alert,  Well-developed, well-nourished, pleasant and cooperative in NAD Head:  Normocephalic and atraumatic. Eyes:  Sclera clear, no icterus.   Conjunctiva pink. Ears:  Normal auditory acuity. Nose:  No deformity, discharge,  or lesions. Mouth:  No deformity or lesions, dentition normal. Lungs:  rhonchi present throughout Heart:  Regular rate and rhythm; no murmurs, clicks, rubs,  or gallops. Abdomen:  Soft, non taut, generalized tenderness to palpation with moderate ascites present Rectal:  Deferred until time of colonoscopy.   Msk:  Symmetrical without gross deformities. Normal posture. Pulses:  Normal pulses noted. Extremities:  Without clubbing or edema. Neurologic:  Alert and  oriented x4;  grossly  normal neurologically. Skin:  Intact without significant lesions or rashes. Pallor  Psych:  Alert and cooperative. Normal mood and affect.   Lab Results: Recent Labs    10/14/20 1102  WBC 6.0  HGB 7.1*  HCT 25.8*  PLT 162   BMET Recent Labs    10/14/20 1102  NA 137  K 4.1  CL 105  CO2 24  GLUCOSE 151*  BUN 78*  CREATININE 2.68*  CALCIUM 8.6*   LFT Recent Labs    10/14/20 1102  PROT 7.0  ALBUMIN 3.6  AST 34  ALT 31  ALKPHOS 75  BILITOT 0.9   PT/INR Recent Labs    10/14/20 1102  LABPROT 36.2*  INR 3.6*    Studies/Results: CT Abdomen Pelvis Wo Contrast  Result Date: 10/14/2020 CLINICAL DATA:  Evaluate for infection EXAM: CT CHEST, ABDOMEN AND PELVIS WITHOUT CONTRAST TECHNIQUE: Multidetector CT imaging of the chest, abdomen and pelvis was performed following the standard protocol without IV contrast. COMPARISON:  CT abdomen and pelvis dated December 26, 2018 FINDINGS: CT CHEST FINDINGS Cardiovascular: Cardiomegaly. No pericardial effusion. Three-vessel coronary artery calcifications. Mitral annular calcifications. Atherosclerotic disease of the thoracic aorta. Mediastinum/Nodes: Mildly enlarged mediastinal lymph nodes which are likely reactive. Reference right pulmonary ligament lymph node measuring 1.4 cm in short axis on series 2, image 31. Esophagus and thyroid are unremarkable. Lungs/Pleura: Central airways are patent. Small bilateral pleural effusions and atelectasis. Ground-glass opacities of the bilateral upper lungs. Solid pulmonary nodule of the right lower lobe measuring 3 mm in mean diameter on series 3, image 78. Musculoskeletal: No chest wall mass or suspicious bone lesions identified. CT ABDOMEN PELVIS FINDINGS Hepatobiliary: Nodular liver contour. Gallbladder is unremarkable. No biliary ductal dilation. Pancreas: Mild peripancreatic fat stranding, likely due to volume status given normal serum lipase. Otherwise unremarkable. Spleen: Unremarkable.  Adrenals/Urinary Tract: Bilateral adrenal glands are unremarkable. Cortical thinning of the bilateral kidneys. No evidence of hydronephrosis or nephrolithiasis. Distended urinary bladder. Stomach/Bowel: Stomach is within normal limits. Prior right hemi colectomy. No evidence of bowel wall thickening, distention, or inflammatory changes. Vascular/Lymphatic: Aortic atherosclerosis. No enlarged abdominal or pelvic lymph nodes. Reproductive: Prostate is unremarkable. Other: Small volume abdominal ascites. Diffuse soft tissue anasarca. Musculoskeletal: No acute or significant osseous findings. IMPRESSION: Mild bilateral ground-glass opacities which are most prominent in the upper lungs, favor pulmonary edema, although an infectious process could appear similar. Small bilateral pleural effusions, small volume abdominal ascites, and soft tissue anasarca, likely due to volume status. Nodular liver contour, suggestive of  cirrhosis. Solid pulmonary nodule the right lower lobe measuring 3 mm. No follow-up needed if patient is low-risk. Non-contrast chest CT can be considered in 12 months if patient is high-risk. This recommendation follows the consensus statement: Guidelines for Management of Incidental Pulmonary Nodules Detected on CT Images: From the Fleischner Society 2017; Radiology 2017; 284:228-243. Aortic Atherosclerosis (ICD10-I70.0). Electronically Signed   By: Yetta Glassman M.D.   On: 10/14/2020 13:11   CT Chest Wo Contrast  Result Date: 10/14/2020 CLINICAL DATA:  Evaluate for infection EXAM: CT CHEST, ABDOMEN AND PELVIS WITHOUT CONTRAST TECHNIQUE: Multidetector CT imaging of the chest, abdomen and pelvis was performed following the standard protocol without IV contrast. COMPARISON:  CT abdomen and pelvis dated December 26, 2018 FINDINGS: CT CHEST FINDINGS Cardiovascular: Cardiomegaly. No pericardial effusion. Three-vessel coronary artery calcifications. Mitral annular calcifications. Atherosclerotic disease  of the thoracic aorta. Mediastinum/Nodes: Mildly enlarged mediastinal lymph nodes which are likely reactive. Reference right pulmonary ligament lymph node measuring 1.4 cm in short axis on series 2, image 31. Esophagus and thyroid are unremarkable. Lungs/Pleura: Central airways are patent. Small bilateral pleural effusions and atelectasis. Ground-glass opacities of the bilateral upper lungs. Solid pulmonary nodule of the right lower lobe measuring 3 mm in mean diameter on series 3, image 78. Musculoskeletal: No chest wall mass or suspicious bone lesions identified. CT ABDOMEN PELVIS FINDINGS Hepatobiliary: Nodular liver contour. Gallbladder is unremarkable. No biliary ductal dilation. Pancreas: Mild peripancreatic fat stranding, likely due to volume status given normal serum lipase. Otherwise unremarkable. Spleen: Unremarkable. Adrenals/Urinary Tract: Bilateral adrenal glands are unremarkable. Cortical thinning of the bilateral kidneys. No evidence of hydronephrosis or nephrolithiasis. Distended urinary bladder. Stomach/Bowel: Stomach is within normal limits. Prior right hemi colectomy. No evidence of bowel wall thickening, distention, or inflammatory changes. Vascular/Lymphatic: Aortic atherosclerosis. No enlarged abdominal or pelvic lymph nodes. Reproductive: Prostate is unremarkable. Other: Small volume abdominal ascites. Diffuse soft tissue anasarca. Musculoskeletal: No acute or significant osseous findings. IMPRESSION: Mild bilateral ground-glass opacities which are most prominent in the upper lungs, favor pulmonary edema, although an infectious process could appear similar. Small bilateral pleural effusions, small volume abdominal ascites, and soft tissue anasarca, likely due to volume status. Nodular liver contour, suggestive of cirrhosis. Solid pulmonary nodule the right lower lobe measuring 3 mm. No follow-up needed if patient is low-risk. Non-contrast chest CT can be considered in 12 months if patient is  high-risk. This recommendation follows the consensus statement: Guidelines for Management of Incidental Pulmonary Nodules Detected on CT Images: From the Fleischner Society 2017; Radiology 2017; 284:228-243. Aortic Atherosclerosis (ICD10-I70.0). Electronically Signed   By: Yetta Glassman M.D.   On: 10/14/2020 13:11   DG Chest Port 1 View  Result Date: 10/14/2020 CLINICAL DATA:  Cough and chest pain. EXAM: PORTABLE CHEST 1 VIEW COMPARISON:  06/24/2020 FINDINGS: Stable cardiac enlargement. Aortic atherosclerosis. Central pulmonary vascular congestion is noted as before. New asymmetric airspace opacity is identified within the right upper lobe. Chronic pleural thickening along the lateral left base is again seen. IMPRESSION: 1. New right upper lobe airspace opacity compatible with pneumonia. 2. Cardiac enlargement and pulmonary vascular congestion. Electronically Signed   By: Kerby Moors M.D.   On: 10/14/2020 11:34   [place] ASCITES (ABDOMEN LIMITED)  Result Date: 10/14/2020 CLINICAL DATA:  Abdominal distension question ascites. History CHF, cardiomyopathy, coronary artery disease, colon cancer, diabetes mellitus, hypertension EXAM: LIMITED ABDOMEN ULTRASOUND FOR ASCITES TECHNIQUE: Limited ultrasound survey for ascites was performed in all four abdominal quadrants. COMPARISON:  CT abdomen and pelvis 10/14/2020  FINDINGS: Small collection of ascites in LEFT lower quadrant, containing bowel loops. No other significant ascites seen. Volume of ascites is insufficient for paracentesis. IMPRESSION: Low volume ascites insufficient for paracentesis. Electronically Signed   By: Lavonia Dana M.D.   On: 10/14/2020 15:49    Impression: RODDY BELLAMY is a 70 y.o. year old male with history of a fib on chronic anticoagulation (coumadin), cardiomyopathy, MI, pulmonary htn and is on chronic 2L O2. Patient presented to ED with c/o indigestion x3 days as well as belching, nausea and abdominal distention with soreness. Found  to have hgb of 7.1 with FOBT positive in ED. GI consulted for further evaluation of anemia well as new findings of cirrhosis.  Anemia:patient has hx of IDA, he was hospitalized at [facility] in June 2022 after he presented with episode of syncope and found to have hgb of 9, INR 3.6, he had melena and FOBT positive.he underwent push enteroscopy with Dr. Carlean Purl without significant findings other than chronic gastritis, did not feel that patient could safely have givens capsule study at that time. He was started on iron supplementation thereafter.  He has been seen in the past for IDA by Dr. Laural Golden in 2020 with EGD and Colonoscopy w/o significant etiologic findings for his anemia, multiple tubular adenomas  Hgb today is 7.1 with heme positive stool, folate and B12 WNL, Iron 15, TIBC 476 and saturation 3, ferritin 25. patient is unsure if he has had any rectal bleeding or melena. He denies worsening sob but reports some recent fatigue over the past few days. Patient would benefit from colonoscopy, likely tomorrow. If colonoscopy is unrevealing, we will likely proceed with capsule study for further evaluation of his IDA as he had enteroscopy in June 2022 without significant findings.  Cirrhosis: CT A/P w/o contrast done today showed nodular liver contour, suggestive of cirrhosis. patient unaware of any previous diagnosis of cirrhosis, likely related to NASH, however, he did drink alcohol in the past. LFTs are WNL, INR elevated at 3.6 but patient is on chronic anticoagulation for a fib. He does endorse worsening swelling to his abdomen over the past 4-5 days and reports history of this previously a couple of times, however, he denies any previous paracentesis. He denies any episodes of confusion. Ascites US performed today revealed low volume ascites, insufficient for paracentesis. We will check extensive serologies as well as doppler US of liver tomorrow, given new findings of cirrhosis. No indication for sandostatin or  SBP prophylaxis at this time as no evidence of esophageal varices on enteroscopy done in June 2022.   MELD score will not be accurate given supratherapeutic INR from coumadin.  Plan: Trend H&H, transfuse as needed Hold anticoagulation at this time PPI BID IV Trend LFTs/ INR Monitor for signs of HE Doppler US of liver tomorrow Colonoscopy tomorrow, may need capsule study if colonoscopy is unrevealing Acute hep panel and autoimmune hepatitis serologies ordered for tomorrow morning   LOS: 0 days    10/14/2020, 3:57 PM   Taurus Willis L. Alver Sorrow, MSN, APRN, AGNP-C Adult-Gerontology Nurse Practitioner [facility]

## 2020-10-14 NOTE — H&P (Signed)
History and Physical Examination   William Flores JOI:[id number] DOB: 14-Nov-1950 DOA: 10/14/2020  Referring physician:  PCP: [name], [name], MD   Chief Complaint: abdominal pain / bleeding gastric ulcer Level of care: Telemetry  HPI: William Flores is a 70 y.o. male  He reports a week of worsening LUQ abdominal pain, gnawing, constant, worse with food, shortly relieved with cold water drinking, constant belching and nausea without vomiting, a sulfuric smell to his belches, and dark tarry stools during this period. He presents today due to increased pain. Denies any recent nsaid use, denies smoking.  Past Medical History Past Medical History:  Diagnosis Date   CAD (coronary artery disease)    LHC 2/08:  mRCA 50%, o/w no sig CAD, EF 25%   Cardiomyopathy ([place])    EF 35-40% in 2/16 in setting of AF with RVR >> echo 5/16 with improved LVF with EF 65-70%   Chronic atrial fibrillation (HCC)    Chronic diastolic heart failure (HCC)    HFPF - EF ~65-70% (OFTEN EXACERBATED BY AFIB)   CKD (chronic kidney disease)    hx of worsening renal failure and hyperK+ in setting of acute diast CHF >> required CVVHD in 4/16 and dialysis x 1 in 5/16   CKD stage 3 due to type 2 diabetes mellitus ([place]) 09/17/2015   Colon cancer ([place])    Colon polyps 09/21/2012   Tubular adenoma   Diabetes ([place])    Hyperlipidemia    Hypertension    Obesity hypoventilation syndrome ([place])    Renal insufficiency    Sleep apnea     Past Surgical History Past Surgical History:  Procedure Laterality Date   CIRCUMCISION     COLON RESECTION     COLONOSCOPY N/A 09/21/2012   Procedure: COLONOSCOPY;  Surgeon: Inda Castle, MD;  Location: [facility];  Service: Endoscopy;  Laterality: N/A;   COLONOSCOPY N/A 12/28/2018   Procedure: COLONOSCOPY;  Surgeon: Rogene Houston, MD;  Location: [facility];  Service: Endoscopy;  Laterality: N/A;   ENTEROSCOPY N/A 06/24/2020   Procedure: ENTEROSCOPY;  Surgeon: Gatha Mayer, MD;  Location: [facility] ENDOSCOPY;  Service: Endoscopy;  Laterality: N/A;   ESOPHAGOGASTRODUODENOSCOPY N/A 12/28/2018   Procedure: ESOPHAGOGASTRODUODENOSCOPY (EGD);  Surgeon: Rogene Houston, MD;  Location: [facility];  Service: Endoscopy;  Laterality: N/A;   RIGHT HEART CATH N/A 04/11/2019   Procedure: RIGHT HEART CATH;  Surgeon: Larey Dresser, MD;  Location: [facility];  Service: Cardiovascular;  Laterality: N/A;   US ECHOCARDIOGRAPHY  03/04/2010   abnormal study    Home Meds: Prior to Admission medications   Medication Sig Start Date End Date Taking? Authorizing Provider  acetaminophen (TYLENOL) 325 MG tablet Take 2 tablets (650 mg total) by mouth every 6 (six) hours as needed for mild pain (or Fever >/= 101). 01/01/19  Yes Emokpae, Courage, MD  allopurinol (ZYLOPRIM) 300 MG tablet Take 1 tablet (300 mg total) by mouth daily. 12/20/19  Yes [name], [name], MD  atorvastatin (LIPITOR) 20 MG tablet TAKE 1 TABLET BY MOUTH EVERY DAY Patient taking differently: Take 20 mg by mouth daily. 03/19/20  Yes [name], [name], MD  benzonatate (TESSALON) 100 MG capsule Take 1 capsule (100 mg total) by mouth 2 (two) times daily as needed for cough. 06/25/20  Yes Patrecia Pour, MD  Dulaglutide (TRULICITY) 1.5 OB/0.9GG SOPN Inject 1.5 mg into the skin once a week. 12/20/19  Yes [name], [name], MD  FARXIGA 5  MG TABS tablet Take 5 mg by mouth daily. 01/26/19  Yes [provider]  ferrous sulfate 325 (65 FE) MG tablet Take 325 mg by mouth daily with breakfast.    Yes [provider]  Insulin Glargine (LANTUS SOLOSTAR [place]) Inject into the skin. 16 units   Yes [provider]  KLOR-CON M20 20 MEQ tablet TAKE 1/2 TABLET (10 MEQ TOTAL) BY MOUTH DAILY. NEED OFFICE VISIT Patient taking differently: Take 10 mEq by mouth daily. 09/15/20  Yes [name], [name], MD  metoprolol succinate (TOPROL-XL) 50 MG 24 hr tablet Take 1.5 tablets (75 mg total) by mouth daily. Take with or  immediately following a meal. 09/10/20  Yes Larey Dresser, MD  pantoprazole (PROTONIX) 40 MG tablet TAKE 1 TABLET BY MOUTH EVERY DAY 06/02/20  Yes [name], [name], MD  polyethylene glycol (MIRALAX / GLYCOLAX) 17 g packet Take 17 g by mouth daily. 01/01/19  Yes Emokpae, Courage, MD  sildenafil (REVATIO) 20 MG tablet Take 1 tablet (20 mg total) by mouth 3 (three) times daily. 08/24/20  Yes Larey Dresser, MD  torsemide (DEMADEX) 20 MG tablet Take 3 tablets (60 mg total) by mouth 2 (two) times daily. 09/29/20  Yes Larey Dresser, MD  warfarin (COUMADIN) 7.5 MG tablet Take 7.5 mg by mouth daily.   Yes [provider]  azelastine (OPTIVAR) 0.05 % ophthalmic solution 1 drop 2 (two) times daily. Give 2 drops in the left eye bid for allergic itching eyes Patient not taking: No sig reported    [provider]  gentamicin (GARAMYCIN) 0.3 % ophthalmic ointment 3 (three) times daily. Patient not taking: No sig reported    [provider]  glucose blood (CONTOUR NEXT TEST) test strip Test BS TID Dx E11.29 05/22/20   [name], [name], MD  Insulin Pen Needle 32G X 4 MM MISC Use to give insulin daily Dx E11.29 04/15/19   Charlynne Cousins, MD  diltiazem [facility]) 240 MG 24 hr capsule TAKE 1 CAPSULE DAILY 10/19/12 02/17/13  Vernie Shanks, MD    Allergies: Codeine  Social History:  Social History   Socioeconomic History   Marital status: Divorced    Spouse name: Not on file   Number of children: 1   Years of education: 12   Highest education level: 12th grade  Occupational History    Employer: [employer]  Tobacco Use   Smoking status: Former    Types: Cigarettes    Quit date: 08/06/1983    Years since quitting: 37.2   Smokeless tobacco: Never  Substance and Sexual Activity   Alcohol use: No   Drug use: No   Sexual activity: Not Currently  Other Topics Concern   Not on file  Social History Narrative   Lives alone   Social Determinants of Health    Financial Resource Strain: Low Risk    Difficulty of Paying Living Expenses: Not very hard  Food Insecurity: Not on file  Transportation Needs: Not on file  Physical Activity: Inactive   Days of Exercise per Week: 0 days   Minutes of Exercise per Session: 0 min  Stress: Not on file  Social Connections: Socially Isolated   Frequency of Communication with Friends and Family: More than three times a week   Frequency of Social Gatherings with Friends and Family: More than three times a week   Attends Religious Services: Never   Marine scientist or Organizations: No   Attends Archivist  Meetings: Never   Marital Status: Divorced  Human resources officer Violence: Not on file   Family History:  Family History  Problem Relation Age of Onset   Breast cancer Mother    Diabetes Mother    Heart attack Father     Review of Systems: The patient denies anorexia, fever, weight loss,, vision loss, decreased hearing, hoarseness, chest pain, syncope, peripheral edema, balance deficits, hemoptysis, hematochezia, hematuria, incontinence, genital sores, muscle weakness, suspicious skin lesions, transient blindness, difficulty walking, depression, unusual weight change, abnormal bleeding, enlarged lymph nodes, angioedema, and breast masses.   He reports dyspnea with exertion, shortness of breath, melena, gnawing LUQ abd pain, nausea, belching with a sulfuric taste  All other systems reviewed and reported as negative.   Physical Exam: General appearance: alert, cooperative, appears older than stated age, fatigued, moderate distress, morbidly obese, and pale  Lungs: diminished breath sounds bibasilar due to hypoventilation  Heart: S1: normal, S2: normal, no S3 or S4, systolic murmur: early systolic 3/6, decrescendo and blowing radiates to axilla, no click, and no rub  Abdomen: abnormal findings:  ascites, distended, obese, shifting dullness, and umbilical hernia, scar crossing the  umbilicus, lower abdomen edema  Skin: Overall pale appearance, excoriations on his abdomen, with venous insufficiency appearance of his lower extremities without edema of his lower extremities.  Lab  And Imaging results:  Results for orders placed or performed during the hospital encounter of 10/14/20 (from the past 24 hour(s))  Comprehensive metabolic panel     Status: Abnormal   Collection Time: 10/14/20 11:02 AM  Result Value Ref Range   Sodium 137 135 - 145 mmol/L   Potassium 4.1 3.5 - 5.1 mmol/L   Chloride 105 98 - 111 mmol/L   CO2 24 22 - 32 mmol/L   Glucose, Bld 151 (H) 70 - 99 mg/dL   BUN 78 (H) 8 - 23 mg/dL   Creatinine, Ser 2.68 (H) 0.61 - 1.24 mg/dL   Calcium 8.6 (L) 8.9 - 10.3 mg/dL   Total Protein 7.0 6.5 - 8.1 g/dL   Albumin 3.6 3.5 - 5.0 g/dL   AST 34 15 - 41 U/L   ALT 31 0 - 44 U/L   Alkaline Phosphatase 75 38 - 126 U/L   Total Bilirubin 0.9 0.3 - 1.2 mg/dL   GFR, Estimated 25 (L) >60 mL/min   Anion gap 8 5 - 15  Lipase, blood     Status: None   Collection Time: 10/14/20 11:02 AM  Result Value Ref Range   Lipase 44 11 - 51 U/L  Lactic acid, plasma     Status: None   Collection Time: 10/14/20 11:02 AM  Result Value Ref Range   Lactic Acid, Venous 1.4 0.5 - 1.9 mmol/L  Brain natriuretic peptide     Status: Abnormal   Collection Time: 10/14/20 11:02 AM  Result Value Ref Range   B Natriuretic Peptide 340.0 (H) 0.0 - 100.0 pg/mL  CBC with Differential     Status: Abnormal   Collection Time: 10/14/20 11:02 AM  Result Value Ref Range   WBC 6.0 4.0 - 10.5 K/uL   RBC 2.89 (L) 4.22 - 5.81 MIL/uL   Hemoglobin 7.1 (L) 13.0 - 17.0 g/dL   HCT 25.8 (L) 39.0 - 52.0 %   MCV 89.3 80.0 - 100.0 fL   MCH 24.6 (L) 26.0 - 34.0 pg   MCHC 27.5 (L) 30.0 - 36.0 g/dL   RDW 19.4 (H) 11.5 - 15.5 %   Platelets 162 150 -  400 K/uL   nRBC 0.8 (H) 0.0 - 0.2 %   Neutrophils Relative % 74 %   Neutro Abs 4.4 1.7 - 7.7 K/uL   Lymphocytes Relative 11 %   Lymphs Abs 0.7 0.7 - 4.0 K/uL    Monocytes Relative 12 %   Monocytes Absolute 0.7 0.1 - 1.0 K/uL   Eosinophils Relative 2 %   Eosinophils Absolute 0.1 0.0 - 0.5 K/uL   Basophils Relative 1 %   Basophils Absolute 0.1 0.0 - 0.1 K/uL   Immature Granulocytes 0 %   Abs Immature Granulocytes 0.02 0.00 - 0.07 K/uL  Protime-INR     Status: Abnormal   Collection Time: 10/14/20 11:02 AM  Result Value Ref Range   Prothrombin Time 36.2 (H) 11.4 - 15.2 seconds   INR 3.6 (H) 0.8 - 1.2  Troponin I (High Sensitivity)     Status: None   Collection Time: 10/14/20 11:02 AM  Result Value Ref Range   Troponin I (High Sensitivity) 15 <18 ng/L  POC occult blood, ED     Status: Abnormal   Collection Time: 10/14/20  1:31 PM  Result Value Ref Range   Fecal Occult Bld POSITIVE (A) NEGATIVE  Resp Panel by RT-PCR (Flu A&B, Covid) Nasopharyngeal Swab     Status: None   Collection Time: 10/14/20  2:15 PM   Specimen: Nasopharyngeal Swab; Nasopharyngeal(NP) swabs in vial transport medium  Result Value Ref Range   SARS Coronavirus 2 by RT PCR NEGATIVE NEGATIVE   Influenza A by PCR NEGATIVE NEGATIVE   Influenza B by PCR NEGATIVE NEGATIVE  Reticulocytes     Status: Abnormal   Collection Time: 10/14/20  2:39 PM  Result Value Ref Range   Retic Ct Pct 2.4 0.4 - 3.1 %   RBC. 2.75 (L) 4.22 - 5.81 MIL/uL   Retic Count, Absolute 66.8 19.0 - 186.0 K/uL   Immature Retic Fract 24.0 (H) 2.3 - 15.9 %     Impression  Principal Problem:   GI bleed -hemoccult positive, anemic, increased BUN, suspicious for bleeding gastric ulcer -anemic 7.1 normocytic -bun 78  Active Problems:    Fecal occult blood test positive -positive 10/14/20 with reported dark, tarry stools    Normocytic anemia -10/12 hemoglobin of 7.1, will monitor closely and transfuse if needed -GI consulted for GI bleed -continue po iron -blood typing done    Anticoagulated on Coumadin -ceased coumadin 10/12 -38m vitamin K given 10/12 -Initial INR 3.6    Acquired thrombophilia  (HCC) -d/t coumadin    Morbid obesity with BMI of 45.0-49.9, adult (HCampti -outpatient lifestyle modifications -plausible obesity hypoventilation    Liver cirrhosis ([place] -suggested on CT abdomen, ascites probable, Gi consulted -abdominal ultrasound to evaluate the extent of his ascites is underway    Gout -no current complaints -continue allopurinol    Hypertensive heart disease -currently normotensive, significant RV dilation from pulmonary vascular hypertension -echo ordered -last echo 06/21/20 50/55% LVEF    History of colon cancer -previous colonoscopy with several polyps removed    Right heart failure (HCC) -significant right ventricle dilation with evidence of pulmonary artery hypertension -bnp 340    OSA (obstructive sleep apnea) -uses CPAP at home    Nonischemic cardiomyopathy ([place] -complete cardiac echo ordered, last echo 06/21/20 50/55% LVEF    Atrial fibrillation with slow ventricular response ([place] -currently in Afib, rate in the 80's, anticoagulated, will cease and reverse due to gi bleed    DM (diabetes mellitus), type 2 with renal complications ([place] -  A1c of 8+ -SSI -carb modified diet    Essential hypertension -continue metoprolol -hold farxiga -hold torsemide -begin lasix IV    MGUS (monoclonal gammopathy of unknown significance)    CKD stage 3 due to type 2 diabetes mellitus ([place]) -Consider consulting nephrology -baseline creatinine 2-2.2, currently 2.68 10/14/20 -does not appear dehydrated and questionable if this is a progression of his CKD    Hyperlipidemia associated with type 2 diabetes mellitus ([place]) -continue atorvastatin    Pulmonary hypertension ([place]) -continue sildenafil         Plan  GI has been consulted referencing a GI bleed, 85m IV protonix was given, 561mvitamin K has been given as he presented supratherapeutic INR at 3.6. If he requires transfusion, his blood has been drawn for a type and screen. Abdominal [place]  underway to evaluate the extent of his ascites and the need for either paracentesis or diuresis.   ChFawn Kirkarkinson student TrSkylandNCAlaska0/12/2020, 3:33 PM How to contact the TREye Laser And Surgery Center Of Columbus LLCttending or Consulting provider 7ACopake Laker covering provider during after hours 7PTodd Creekfor this patient?  Check the care team in CH[facility] Cent[facility]d look for a) attending/consulting TRH provider listed and b) the TRPresbyterian Medical Group Doctor [facility] Memorial Hospitaleam listed Log into [email or web address] and use [place]'s universal password to access. If you do not have the password, please contact the hospital operator. Locate the TRRiver North Same Day Surgery LLCrovider you are looking for under [facility] and page to a number that you can be directly reached. If you still have difficulty reaching the provider, please page the DOBayhealth Hospital Sussex CampusDirector on Call) for the Hospitalists listed on amion for assistance.

## 2020-10-14 NOTE — ED Notes (Signed)
Pt helped to bedside commode and back into bed.

## 2020-10-15 ENCOUNTER — Inpatient Hospital Stay (HOSPITAL_COMMUNITY): Payer: Medicare Other

## 2020-10-15 DIAGNOSIS — I509 Heart failure, unspecified: Secondary | ICD-10-CM

## 2020-10-15 DIAGNOSIS — D62 Acute posthemorrhagic anemia: Secondary | ICD-10-CM

## 2020-10-15 LAB — BASIC METABOLIC PANEL
Anion gap: 9 (ref 5–15)
BUN: 75 mg/dL — ABNORMAL HIGH (ref 8–23)
CO2: 22 mmol/L (ref 22–32)
Calcium: 8.5 mg/dL — ABNORMAL LOW (ref 8.9–10.3)
Chloride: 105 mmol/L (ref 98–111)
Creatinine, Ser: 2.7 mg/dL — ABNORMAL HIGH (ref 0.61–1.24)
GFR, Estimated: 25 mL/min — ABNORMAL LOW (ref >60)
Glucose, Bld: 122 mg/dL — ABNORMAL HIGH (ref 70–99)
Potassium: 4.2 mmol/L (ref 3.5–5.1)
Sodium: 136 mmol/L (ref 135–145)

## 2020-10-15 LAB — GLUCOSE, CAPILLARY
Glucose-Capillary: 110 mg/dL — ABNORMAL HIGH (ref 70–99)
Glucose-Capillary: 126 mg/dL — ABNORMAL HIGH (ref 70–99)
Glucose-Capillary: 135 mg/dL — ABNORMAL HIGH (ref 70–99)
Glucose-Capillary: 111 mg/dL — ABNORMAL HIGH (ref 70–99)

## 2020-10-15 LAB — PROTIME-INR
INR: 3.2 — ABNORMAL HIGH (ref 0.8–1.2)
Prothrombin Time: 32.8 seconds — ABNORMAL HIGH (ref 11.4–15.2)

## 2020-10-15 LAB — PREPARE RBC (CROSSMATCH)

## 2020-10-15 LAB — CBC
HCT: 21.9 % — ABNORMAL LOW (ref 39.0–52.0)
Hemoglobin: 5.8 g/dL — CL (ref 13.0–17.0)
MCH: 23.8 pg — ABNORMAL LOW (ref 26.0–34.0)
MCHC: 26.5 g/dL — ABNORMAL LOW (ref 30.0–36.0)
MCV: 89.8 fL (ref 80.0–100.0)
Platelets: 129 10*3/uL — ABNORMAL LOW (ref 150–400)
RBC: 2.44 MIL/uL — ABNORMAL LOW (ref 4.22–5.81)
RDW: 19.4 % — ABNORMAL HIGH (ref 11.5–15.5)
WBC: 4.2 10*3/uL (ref 4.0–10.5)
nRBC: 1.2 % — ABNORMAL HIGH (ref 0.0–0.2)

## 2020-10-15 LAB — HEPATITIS PANEL, ACUTE
HCV Ab: NONREACTIVE
Hep A IgM: NONREACTIVE
Hep B C IgM: NONREACTIVE
Hepatitis B Surface Ag: NONREACTIVE

## 2020-10-15 LAB — BRAIN NATRIURETIC PEPTIDE: B Natriuretic Peptide: 273 pg/mL — ABNORMAL HIGH (ref 0.0–100.0)

## 2020-10-15 MED ORDER — DIPHENHYDRAMINE HCL 25 MG PO CAPS: 25.0000 mg | ORAL_CAPSULE | Freq: Four times a day (QID) | ORAL | Status: DC | PRN

## 2020-10-15 MED ORDER — DIPHENHYDRAMINE HCL 25 MG PO CAPS
25.0000 mg | ORAL_CAPSULE | Freq: Four times a day (QID) | ORAL | Status: DC | PRN
  Administered 2020-10-15: 25 mg via ORAL
  Filled 2020-10-15: qty 1

## 2020-10-15 MED ORDER — SODIUM CHLORIDE 0.9 % IV SOLN
2.0000 g | INTRAVENOUS | Status: DC
  Administered 2020-10-15 – 2020-10-19 (×5): 2 g via INTRAVENOUS
  Filled 2020-10-15 (×5): qty 20

## 2020-10-15 MED ORDER — BENZONATATE 100 MG PO CAPS
100.0000 mg | ORAL_CAPSULE | Freq: Three times a day (TID) | ORAL | Status: DC | PRN
  Administered 2020-10-15 – 2020-10-18 (×6): 100 mg via ORAL
  Filled 2020-10-15 (×6): qty 1

## 2020-10-15 MED ORDER — LIVING BETTER WITH HEART FAILURE BOOK: Freq: Once | Status: AC

## 2020-10-15 MED ORDER — PHYTONADIONE 5 MG PO TABS
7.5000 mg | ORAL_TABLET | Freq: Once | ORAL | Status: AC
  Administered 2020-10-15: 7.5 mg via ORAL
  Filled 2020-10-15: qty 2

## 2020-10-15 MED ORDER — SODIUM CHLORIDE 0.9% IV SOLUTION: Freq: Once | INTRAVENOUS | Status: AC

## 2020-10-15 MED ORDER — ALUM & MAG HYDROXIDE-SIMETH 200-200-20 MG/5ML PO SUSP
30.0000 mL | ORAL | Status: DC | PRN
  Administered 2020-10-15 – 2020-10-16 (×3): 30 mL via ORAL
  Filled 2020-10-15 (×3): qty 30

## 2020-10-15 NOTE — Progress Notes (Signed)
PROGRESS NOTE   CALOB BASKETTE  BWI:[id number] DOB: Dec 20, 1950 DOA: 10/14/2020 PCP: [name], [name], MD   Chief Complaint  Patient presents with   Gastroesophageal Reflux   Level of care: Telemetry  Brief Admission History:  Mr. Samiel Peel is a medically complex gentleman who presents with multiple complaints including increasing generalized weakness and debility, increasing abdominal distention, uncontrolled acid reflux symptoms, shortness of breath, inability to lie recumbent, DOE, increasing edema in lower extremities, occasional black stools, and worsening LUQ abd pain.  He is followed by the heart failure team in [place].  He has pulmonary hypertension and is on sildenafil TID. He was found to be anemic, hemoglobin at 7.1, now 5.8, requiring 2x PRBC. Warfarin was stopped, vitamin K given, IV lasix was given for his abdominal edema and small volume ascites. IV protonix bid has been continued. Pulmonary edema and pleural effusions noted 10/13 on CXR, lasix 94m bid continued. GI plans for a colonoscopy in the near future.  Assessment & Plan: Principal Problem:   GI bleed -hemoccult positive, anemic, increased BUN, suspicious for bleeding gastric ulcer -anemic 5.8 normocytic, 2x PRBC ordered  -GI plans colonoscopy  Active Problems:     Fecal occult blood test positive -positive 10/14/20 with reported dark, tarry stools -GI pans colonoscopy     Normocytic anemia -10/12 hemoglobin of 7.1 -10/13, hemoglobin of 5.8 with two units of PRBC ordered -GI consulted for GI bleed -continue po iron     Anticoagulated on Coumadin -ceased coumadin 10/12 -565mvitamin K given 10/12 -7.5 mg vitamin k  10/13 -Initial INR 3.6 on 10/12,  10/13 3.2     Acquired thrombophilia (HCC) -d/t coumadin     Morbid obesity with BMI of 45.0-49.9, adult ([facility]-out[facility] lifestyle modifications -plausible obesity hypoventilation     Liver cirrhosis ([facility]-suggested on CT abdomen, Gi  consulted -abdominal ultrasound shows cirrhosis with splenomegaly and perihepatic ascites. Findings are suggestive for portal hypertension. Portal venous system is patent with normal direction of flow.  -Low volume ascites insufficient for paracentesis    Gout -no current complaints -continue allopurinol     Hypertensive heart disease -currently normotensive, significant RV dilation from pulmonary vascular hypertension -echo ordered -last echo 06/21/20 50/55% LVEF     History of colon cancer -previous colonoscopy with several polyps removed     Right heart failure (HCC) -significant right ventricle dilation with evidence of pulmonary artery hypertension -bnp 10/12 340, 10/13 273     OSA (obstructive sleep apnea) -uses CPAP at home     Nonischemic cardiomyopathy ([facility]-complete cardiac echo ordered - last echo 06/21/20 50/55% LVEF     Atrial fibrillation with slow ventricular response (HCC) -currently in Afib, rate in the 80's, anticoagulated, will cease and reverse due to gi bleed     DM (diabetes mellitus), type 2 with renal complications (HCC) -A1U3Af 8+ -SSI -carb modified diet     Essential hypertension -continue metoprolol -hold farxiga -hold torsemide -begin lasix IV     MGUS (monoclonal gammopathy of unknown significance)     CKD stage 3 due to type 2 diabetes mellitus ([facility]-Consider consulting nephrology -baseline creatinine 2-2.2,  2.68 10/14/20, 10/13 2.7 -does not appear dehydrated and questionable if this is a progression of his CKD     Hyperlipidemia associated with type 2 diabetes mellitus ([facility]-continue atorvastatin     Pulmonary hypertension ([facility]-continue sildenafil  DVT prophylaxis: scd Code Status: full Family Communication: none at bedside Disposition: stabilize for GI to  determine source of GI bleed, then anticipate home or possibly short time at SNF Status is: Inpatient  Remains inpatient appropriate because: requires blood  transfusions for active gi bleed   Antimicrobials: -ceftriaxone 10/13- current, SBP prophylaxis per gi   Consultants:  GI  Procedures:  Liver US Doppler CXR [place] ascites Colonoscopy planned   Subjective: Reports improved LUQ pain, improved/relief from belching, improved nausea, continued feet pain with elevation, fatigue which is probably due to declining hemoglobin  Objective: Vitals:   10/15/20 0918 10/15/20 0923 10/15/20 0944 10/15/20 1159  BP: 136/78 136/78 126/61 108/89  Pulse: (!) 102 (!) 102 96 (!) 103  Resp: 18 18 18 17   Temp: 97.9 F (36.6 C) 97.9 F (36.6 C) (!) 97.5 F (36.4 C) 97.6 F (36.4 C)  TempSrc:  Oral Oral Oral  SpO2:  100% 100% 98%  Weight:      Height:        Intake/Output Summary (Last 24 hours) at 10/15/2020 1340 Last data filed at 10/15/2020 1256 Gross per 24 hour  Intake 1116 ml  Output --  Net 1116 ml   Filed Weights   10/14/20 1036 10/14/20 2057  Weight: 121.6 kg 121.6 kg    Examination:  General exam: Appears tired, pale, conversational  Respiratory system: Clear to auscultation. Respiratory effort normal, limited inspiration due to abdominal size  Cardiovascular system: normal S1 & S2 heard. No JVD, murmurs, rubs, gallops or clicks. No pedal edema. Decreased pedal pulses bilaterally, less than 2 seconds capillary refill in his feel bilaterally  Gastrointestinal system: Abdomen is large, reduced lower abd edema compared to yesterday, mild LUQ tenderness, non tympanic, with present bowel sounds  Central nervous system: Alert and oriented. No focal neurological deficits.  Extremities: moves all extremities appropriately, reports feet are painful when lying in bed, reports worsened when his hemoglobin began to decline, decreased pedal pulses with normal cap refill in his feet  Skin: excoriations, no overt bruising or rashes  Psychiatry: Judgement and insight appear normal. Mood & affect appropriate.   Data Reviewed: I have  personally reviewed following labs and imaging studies  CBC: Recent Labs  Lab 10/14/20 1102 10/15/20 0538  WBC 6.0 4.2  NEUTROABS 4.4  --   HGB 7.1* 5.8*  HCT 25.8* 21.9*  MCV 89.3 89.8  PLT 162 129*    Basic Metabolic Panel: Recent Labs  Lab 10/14/20 1102 10/15/20 0538  NA 137 136  K 4.1 4.2  CL 105 105  CO2 24 22  GLUCOSE 151* 122*  BUN 78* 75*  CREATININE 2.68* 2.70*  CALCIUM 8.6* 8.5*    GFR: Estimated Creatinine Clearance: 31.2 mL/min (A) (by C-G formula based on SCr of 2.7 mg/dL (H)).  Liver Function Tests: Recent Labs  Lab 10/14/20 1102  AST 34  ALT 31  ALKPHOS 75  BILITOT 0.9  PROT 7.0  ALBUMIN 3.6    CBG: Recent Labs  Lab 10/14/20 2015 10/14/20 2109 10/15/20 0732 10/15/20 1128  GLUCAP 102* 147* 110* 126*    Recent Results (from the past 240 hour(s))  Resp Panel by RT-PCR (Flu A&B, Covid) Nasopharyngeal Swab     Status: None   Collection Time: 10/14/20  2:15 PM   Specimen: Nasopharyngeal Swab; Nasopharyngeal(NP) swabs in vial transport medium  Result Value Ref Range Status   SARS Coronavirus 2 by RT PCR NEGATIVE NEGATIVE Final    Comment: (NOTE) SARS-CoV-2 target nucleic acids are NOT DETECTED.  The SARS-CoV-2 RNA is generally detectable in upper respiratory specimens during  the acute phase of infection. The lowest concentration of SARS-CoV-2 viral copies this assay can detect is 138 copies/mL. A negative result does not preclude SARS-Cov-2 infection and should not be used as the sole basis for treatment or other patient management decisions. A negative result may occur with  improper specimen collection/handling, submission of specimen other than nasopharyngeal swab, presence of viral mutation(s) within the areas targeted by this assay, and inadequate number of viral copies(<138 copies/mL). A negative result must be combined with clinical observations, patient history, and epidemiological information. The expected result is  Negative.  Fact Sheet for Patients:  EntrepreneurPulse.com.au  Fact Sheet for Healthcare Providers:  IncredibleEmployment.be  This test is no t yet approved or cleared by the [place] FDA and  has been authorized for detection and/or diagnosis of SARS-CoV-2 by FDA under an Emergency Use Authorization (EUA). This EUA will remain  in effect (meaning this test can be used) for the duration of the COVID-19 declaration under Section 564(b)(1) of the Act, 21 U.S.C.section 360bbb-3(b)(1), unless the authorization is terminated  or revoked sooner.       Influenza A by PCR NEGATIVE NEGATIVE Final   Influenza B by PCR NEGATIVE NEGATIVE Final    Comment: (NOTE) The Xpert Xpress SARS-CoV-2/FLU/RSV plus assay is intended as an aid in the diagnosis of influenza from Nasopharyngeal swab specimens and should not be used as a sole basis for treatment. Nasal washings and aspirates are unacceptable for Xpert Xpress SARS-CoV-2/FLU/RSV testing.  Fact Sheet for Patients: EntrepreneurPulse.com.au  Fact Sheet for Healthcare Providers: IncredibleEmployment.be  This test is not yet approved or cleared by the [place] FDA and has been authorized for detection and/or diagnosis of SARS-CoV-2 by FDA under an Emergency Use Authorization (EUA). This EUA will remain in effect (meaning this test can be used) for the duration of the COVID-19 declaration under Section 564(b)(1) of the Act, 21 U.S.C. section 360bbb-3(b)(1), unless the authorization is terminated or revoked.  Performed at [facility], [address], [place], [place] [address]      Radiology Studies: CT Abdomen Pelvis Wo Contrast  Result Date: 10/14/2020 CLINICAL DATA:  Evaluate for infection EXAM: CT CHEST, ABDOMEN AND PELVIS WITHOUT CONTRAST TECHNIQUE: Multidetector CT imaging of the chest, abdomen and pelvis was performed following the standard protocol  without IV contrast. COMPARISON:  CT abdomen and pelvis dated December 26, 2018 FINDINGS: CT CHEST FINDINGS Cardiovascular: Cardiomegaly. No pericardial effusion. Three-vessel coronary artery calcifications. Mitral annular calcifications. Atherosclerotic disease of the thoracic aorta. Mediastinum/Nodes: Mildly enlarged mediastinal lymph nodes which are likely reactive. Reference right pulmonary ligament lymph node measuring 1.4 cm in short axis on series 2, image 31. Esophagus and thyroid are unremarkable. Lungs/Pleura: Central airways are patent. Small bilateral pleural effusions and atelectasis. Ground-glass opacities of the bilateral upper lungs. Solid pulmonary nodule of the right lower lobe measuring 3 mm in mean diameter on series 3, image 78. Musculoskeletal: No chest wall mass or suspicious bone lesions identified. CT ABDOMEN PELVIS FINDINGS Hepatobiliary: Nodular liver contour. Gallbladder is unremarkable. No biliary ductal dilation. Pancreas: Mild peripancreatic fat stranding, likely due to volume status given normal serum lipase. Otherwise unremarkable. Spleen: Unremarkable. Adrenals/Urinary Tract: Bilateral adrenal glands are unremarkable. Cortical thinning of the bilateral kidneys. No evidence of hydronephrosis or nephrolithiasis. Distended urinary bladder. Stomach/Bowel: Stomach is within normal limits. Prior right hemi colectomy. No evidence of bowel wall thickening, distention, or inflammatory changes. Vascular/Lymphatic: Aortic atherosclerosis. No enlarged abdominal or pelvic lymph nodes. Reproductive: Prostate is unremarkable. Other: Small volume  abdominal ascites. Diffuse soft tissue anasarca. Musculoskeletal: No acute or significant osseous findings. IMPRESSION: Mild bilateral ground-glass opacities which are most prominent in the upper lungs, favor pulmonary edema, although an infectious process could appear similar. Small bilateral pleural effusions, small volume abdominal ascites, and soft  tissue anasarca, likely due to volume status. Nodular liver contour, suggestive of cirrhosis. Solid pulmonary nodule the right lower lobe measuring 3 mm. No follow-up needed if patient is low-risk. Non-contrast chest CT can be considered in 12 months if patient is high-risk. This recommendation follows the consensus statement: Guidelines for Management of Incidental Pulmonary Nodules Detected on CT Images: From the Fleischner Society 2017; Radiology 2017; 284:228-243. Aortic Atherosclerosis (ICD10-I70.0). Electronically Signed   By: Yetta Glassman M.D.   On: 10/14/2020 13:11   CT Chest Wo Contrast  Result Date: 10/14/2020 CLINICAL DATA:  Evaluate for infection EXAM: CT CHEST, ABDOMEN AND PELVIS WITHOUT CONTRAST TECHNIQUE: Multidetector CT imaging of the chest, abdomen and pelvis was performed following the standard protocol without IV contrast. COMPARISON:  CT abdomen and pelvis dated December 26, 2018 FINDINGS: CT CHEST FINDINGS Cardiovascular: Cardiomegaly. No pericardial effusion. Three-vessel coronary artery calcifications. Mitral annular calcifications. Atherosclerotic disease of the thoracic aorta. Mediastinum/Nodes: Mildly enlarged mediastinal lymph nodes which are likely reactive. Reference right pulmonary ligament lymph node measuring 1.4 cm in short axis on series 2, image 31. Esophagus and thyroid are unremarkable. Lungs/Pleura: Central airways are patent. Small bilateral pleural effusions and atelectasis. Ground-glass opacities of the bilateral upper lungs. Solid pulmonary nodule of the right lower lobe measuring 3 mm in mean diameter on series 3, image 78. Musculoskeletal: No chest wall mass or suspicious bone lesions identified. CT ABDOMEN PELVIS FINDINGS Hepatobiliary: Nodular liver contour. Gallbladder is unremarkable. No biliary ductal dilation. Pancreas: Mild peripancreatic fat stranding, likely due to volume status given normal serum lipase. Otherwise unremarkable. Spleen: Unremarkable.  Adrenals/Urinary Tract: Bilateral adrenal glands are unremarkable. Cortical thinning of the bilateral kidneys. No evidence of hydronephrosis or nephrolithiasis. Distended urinary bladder. Stomach/Bowel: Stomach is within normal limits. Prior right hemi colectomy. No evidence of bowel wall thickening, distention, or inflammatory changes. Vascular/Lymphatic: Aortic atherosclerosis. No enlarged abdominal or pelvic lymph nodes. Reproductive: Prostate is unremarkable. Other: Small volume abdominal ascites. Diffuse soft tissue anasarca. Musculoskeletal: No acute or significant osseous findings. IMPRESSION: Mild bilateral ground-glass opacities which are most prominent in the upper lungs, favor pulmonary edema, although an infectious process could appear similar. Small bilateral pleural effusions, small volume abdominal ascites, and soft tissue anasarca, likely due to volume status. Nodular liver contour, suggestive of cirrhosis. Solid pulmonary nodule the right lower lobe measuring 3 mm. No follow-up needed if patient is low-risk. Non-contrast chest CT can be considered in 12 months if patient is high-risk. This recommendation follows the consensus statement: Guidelines for Management of Incidental Pulmonary Nodules Detected on CT Images: From the Fleischner Society 2017; Radiology 2017; 284:228-243. Aortic Atherosclerosis (ICD10-I70.0). Electronically Signed   By: Yetta Glassman M.D.   On: 10/14/2020 13:11   DG Chest Port 1 View  Result Date: 10/15/2020 CLINICAL DATA:  Pulmonary edema. EXAM: PORTABLE CHEST 1 VIEW COMPARISON:  October 14, 2020. FINDINGS: Stable cardiomegaly. Mild central pulmonary vascular congestion is noted. Bibasilar pulmonary edema or infiltrate is noted. Small bilateral pleural effusions are noted. Bony thorax is unremarkable. IMPRESSION: Stable cardiomegaly with mild central pulmonary vascular congestion. Bibasilar pulmonary edema or infiltrates are noted. Small bilateral pleural effusions  are noted. Electronically Signed   By: Marijo Conception M.D.   On: 10/15/2020  08:10   DG Chest Port 1 View  Result Date: 10/14/2020 CLINICAL DATA:  Cough and chest pain. EXAM: PORTABLE CHEST 1 VIEW COMPARISON:  06/24/2020 FINDINGS: Stable cardiac enlargement. Aortic atherosclerosis. Central pulmonary vascular congestion is noted as before. New asymmetric airspace opacity is identified within the right upper lobe. Chronic pleural thickening along the lateral left base is again seen. IMPRESSION: 1. New right upper lobe airspace opacity compatible with pneumonia. 2. Cardiac enlargement and pulmonary vascular congestion. Electronically Signed   By: Kerby Moors M.D.   On: 10/14/2020 11:34   [place] ASCITES (ABDOMEN LIMITED)  Result Date: 10/14/2020 CLINICAL DATA:  Abdominal distension question ascites. History CHF, cardiomyopathy, coronary artery disease, colon cancer, diabetes mellitus, hypertension EXAM: LIMITED ABDOMEN ULTRASOUND FOR ASCITES TECHNIQUE: Limited ultrasound survey for ascites was performed in all four abdominal quadrants. COMPARISON:  CT abdomen and pelvis 10/14/2020 FINDINGS: Small collection of ascites in LEFT lower quadrant, containing bowel loops. No other significant ascites seen. Volume of ascites is insufficient for paracentesis. IMPRESSION: Low volume ascites insufficient for paracentesis. Electronically Signed   By: Lavonia Dana M.D.   On: 10/14/2020 15:49   US LIVER DOPPLER  Result Date: 10/15/2020 CLINICAL DATA:  Cirrhosis. EXAM: DUPLEX ULTRASOUND OF LIVER TECHNIQUE: Color and duplex Doppler ultrasound was performed to evaluate the hepatic in-flow and out-flow vessels. COMPARISON:  10/14/2020 and CT 10/14/2020 FINDINGS: Liver: Nodular contour compatible with cirrhosis. Perihepatic ascites. Main Portal Vein size: 1.3 cm Portal Vein Velocities Main Prox:  54 cm/sec Main Mid: 47 cm/sec Main Dist:  24 cm/sec Right: 42 cm/sec Left: 27 cm/sec Hepatic Vein Velocities Right:  64 cm/sec  Middle:  59 cm/sec Left:  87 cm/sec IVC: Present and patent with normal respiratory phasicity. Hepatic Artery Velocity:  95 cm/sec Splenic Vein Velocity:  23 cm/sec Spleen: 17.1 cm x 18.7 cm x 7.3 cm with a total volume of 1229 cm^3 (411 cm^3 is upper limit normal) Portal Vein Occlusion/Thrombus: No Splenic Vein Occlusion/Thrombus: No Ascites: Present Varices: Limited evaluation Normal hepatopetal flow in the portal veins. Normal hepatofugal flow in the hepatic veins. IMPRESSION: 1. Cirrhosis with splenomegaly and perihepatic ascites. Findings are suggestive for portal hypertension. 2. Portal venous system is patent with normal direction of flow. Electronically Signed   By: Markus Daft M.D.   On: 10/15/2020 11:43    Scheduled Meds:  allopurinol  300 mg Oral Daily   atorvastatin  20 mg Oral QPM   ferrous sulfate  325 mg Oral Q breakfast   furosemide  40 mg Intravenous Q12H   insulin aspart  0-9 Units Subcutaneous TID WC   insulin glargine-yfgn  10 Units Subcutaneous Q2200   metoprolol succinate  75 mg Oral Daily   pantoprazole (PROTONIX) IV  40 mg Intravenous Q12H   potassium chloride SA  10 mEq Oral Daily   sildenafil  20 mg Oral TID   Continuous Infusions:   LOS: 1 day    Dennette Faulconer D Joziyah Roblero, student How to contact the [facility] Attending or Consulting provider [place] or covering provider during after hours [place], for this patient?  Check the care team in [facility] and look for a) attending/consulting [facility] provider listed and b) the [id number] team listed Log into [email or web address] and use [place]'s universal password to access. If you do not have the password, please contact the hospital operator. Locate the [facility] provider you are looking for under [facility] and page to a number that you can be directly reached. If you still have difficulty  reaching the provider, please page the [facility] (Director on Call) for the Hospitalists listed on amion for assistance.  10/15/2020, 1:40 PM

## 2020-10-15 NOTE — TOC Initial Note (Signed)
Transition of Care [facility]) - Initial/Assessment Note    Patient Details  Name: William Flores MRN: [id number] Date of Birth: 10-30-1950  [facility]) CM/SW Contact:    Salome Arnt, [place] Phone Number: 10/15/2020, 2:09 PM  Clinical Narrative:  Pt admitted with GI bleed. TOC consulted for CHF screening. Pt reports he lives alone and d/c from [facility] in August. Pt's step-daughter or a friend helps out if needed, but pt indicates he manages fairly well at home. At baseline, he ambulates with a walker. Pt plans to return home when medically stable and requests home health RN/PT. No preference on agency. LCSW referred to [facility] who accepts.   CHF screening completed with pt. He states he weighs himself about twice a week. LCSW reviewed importance of daily weights with pt. He reports when he cooks he usually follows heart healthy diet, but a lot of times someone will bring him food. Pt said he is on Meals on Wheels wait list. He states he takes medications as prescribed. Will order CHF book for pt.                  Expected Discharge Plan: [place] Barriers to Discharge: Continued Medical Work up   Patient Goals and CMS Choice Patient states their goals for this hospitalization and ongoing recovery are:: return home   Choice offered to / list presented to : Patient  Expected Discharge Plan and Services Expected Discharge Plan: [place] In-house Referral: Clinical Social Work   Post Acute Care Choice: [place] arrangements for the past 2 months: [place]                 DME Arranged: N/A         HH Arranged: RN, PT [place] Agency: [place] ([place]) Date [place]: 10/15/20 Time [place]: [place] Representative spoke with at [place]: Vaughan Basta  Prior Living Arrangements/Services Living arrangements for the past 2 months: [place] Lives with::  Self Patient language and need for interpreter reviewed:: Yes Do you feel safe going back to the place where you live?: Yes      Need for Family Participation in Patient Care: No (Comment) Care giver support system in place?: No (comment) Current home services: DME (cane, walker, shower chair, home O2) Criminal Activity/Legal Involvement Pertinent to Current Situation/Hospitalization: No - Comment as needed  Activities of Daily Living Home Assistive Devices/Equipment: Environmental consultant (specify type), Cane (specify quad or straight) ADL Screening (condition at time of admission) Patient's cognitive ability adequate to safely complete daily activities?: Yes Is the patient deaf or have difficulty hearing?: No Does the patient have difficulty seeing, even when wearing glasses/contacts?: No Does the patient have difficulty concentrating, remembering, or making decisions?: No Patient able to express need for assistance with ADLs?: Yes Does the patient have difficulty dressing or bathing?: Yes Independently performs ADLs?: Yes (appropriate for developmental age) Does the patient have difficulty walking or climbing stairs?: Yes Weakness of Legs: Both Weakness of Arms/Hands: None  Permission Sought/Granted                  Emotional Assessment   Attitude/Demeanor/Rapport: Ambitious Affect (typically observed): Accepting Orientation: : Oriented to Self, Oriented to Place, Oriented to  Time, Oriented to Situation Alcohol / Substance Use: Not Applicable Psych Involvement: No (comment)  Admission diagnosis:  Abdominal distension [R14.0] Cirrhosis ([place]) [K74.60] Pulmonary edema [J81.1]  GI bleed [K92.2] Symptomatic anemia [D64.9] Gastrointestinal hemorrhage, unspecified gastrointestinal hemorrhage type [K92.2] Acute on chronic congestive heart failure, unspecified heart failure type [facility]) [I50.9] Patient Active Problem List   Diagnosis Date Noted   GI bleed 10/14/2020   Acquired thrombophilia  ([place]) 10/14/2020   Cirrhosis ([place]) 10/14/2020   Abdominal distension    Anemia, iron deficiency 07/21/2020   Chronic gastritis without bleeding    AKI (acute kidney injury) ([place])    Syncope and collapse    Supratherapeutic INR    Syncope 06/19/2020   Hypokalemia 06/19/2020   Fecal occult blood test positive 06/19/2020   Cor pulmonale, acute ([place]) 04/15/2019   Bilateral leg edema    Chronic renal failure    Pulmonary hypertension ([place]) 12/28/2018   Hyperlipidemia associated with type 2 diabetes mellitus ([place]) 05/18/2017   Morbid obesity with BMI of 45.0-49.9, adult ([place]) 05/18/2017   CKD stage 3 due to type 2 diabetes mellitus ([place]) 09/17/2015   MGUS (monoclonal gammopathy of unknown significance) 05/27/2015   Normocytic anemia 11/04/2014   Peripheral edema    Essential hypertension    DM (diabetes mellitus), type 2 with renal complications ([place]) 46/96/2952   Atrial fibrillation with slow ventricular response ([place]) 04/14/2014   Anticoagulated on Coumadin 03/20/2014   Nonischemic cardiomyopathy ([place]) 02/20/2014   OSA (obstructive sleep apnea)    Right heart failure ([place]) 02/10/2014   Morbid obesity ([place]) 08/05/2013   History of colon cancer    Gout 05/25/2012   Hypertensive heart disease    PCP:  [name], [name], MD Pharmacy:   [id number] [id number]: [phone]: [phone]    Social Determinants of Health (SDOH) Interventions    Readmission Risk Interventions Readmission Risk Prevention Plan 10/15/2020 04/15/2019  Transportation Screening Complete Complete  PCP or Specialist Appt within 3-5 Days - Complete  [place] or [place] Complete  Social Work Consult for [place]/Counseling Complete Complete  Palliative Care Screening Not Applicable Not Applicable  Medication Review (Press photographer Complete Complete  Some recent data might be hidden

## 2020-10-15 NOTE — Progress Notes (Addendum)
   10/15/20 0600  Provider Notification  Provider Name/Title Zierle-Ghosh MD  Date Provider Notified 10/15/20  Time Provider Notified 0630  Notification Type Page  Notification Reason Critical result  Test performed and critical result Hgb 5.8  Date Critical Result Received 10/15/20  Time Critical Result Received 0625  Provider response Other (Comment) (seen, no new orders at this time)  New orders to transfuse 2 units PRBC.

## 2020-10-15 NOTE — Progress Notes (Signed)
Subjective:  Breathing about the same, says near baseline. Coughing some. Feels like abdomen is less distended. Feet have been hurting about a week, notes at rest. Indigestion better.    Objective: Vital signs in last 24 hours: Temp:  [97.5 F (36.4 C)-98.7 F (37.1 C)] 97.5 F (36.4 C) (10/13 0944) Pulse Rate:  [81-106] 96 (10/13 0944) Resp:  [18-29] 18 (10/13 0944) BP: (101-137)/(61-89) 126/61 (10/13 0944) SpO2:  [95 %-100 %] 100 % (10/13 0944) Weight:  [121.6 kg] 121.6 kg (10/12 2057)   General:   Alert,  chronically ill appearing male. Coughed several times during exam. Pleasant and cooperative in NAD Head:  Normocephalic and atraumatic. Eyes:  Sclera clear, no icterus.  Chest: CTA bilaterally without rales,  crackles.  Scattered rhonchi. Heart:  Regular rate and rhythm; no murmurs, clicks, rubs,  or gallops. Abdomen:  Soft, obese. nontender. 1+ pitting edema to lower anterior abdomen. Moderate sized umbilical hernia, easily reducible and nontender.  Normal bowel sounds, without guarding, and without rebound.   Extremities:  Without clubbing, deformity. 1+ pitting edema bilaterally. Neurologic:  Alert and  oriented x4;  grossly normal neurologically. Skin:  Intact without significant lesions or rashes. Psych:  Alert and cooperative. Normal mood and affect.  Intake/Output from previous day: 10/12 0701 - 10/13 0700 In: 400 [P.O.:400] Out: -  Intake/Output this shift: Total I/O In: 476 [P.O.:476] Out: -   Lab Results: CBC Recent Labs    10/14/20 1102 10/15/20 0538  WBC 6.0 4.2  HGB 7.1* 5.8*  HCT 25.8* 21.9*  MCV 89.3 89.8  PLT 162 129*   BMET Recent Labs    10/14/20 1102 10/15/20 0538  NA 137 136  K 4.1 4.2  CL 105 105  CO2 24 22  GLUCOSE 151* 122*  BUN 78* 75*  CREATININE 2.68* 2.70*  CALCIUM 8.6* 8.5*   LFTs Recent Labs    10/14/20 1102  BILITOT 0.9  ALKPHOS 75  AST 34  ALT 31  PROT 7.0  ALBUMIN 3.6   Recent Labs    10/14/20 1102  LIPASE  44   PT/INR Recent Labs    10/14/20 1102 10/15/20 0538  LABPROT 36.2* 32.8*  INR 3.6* 3.2*   Lab Results  Component Value Date   IRON 15 (L) 10/14/2020   TIBC 476 (H) 10/14/2020   FERRITIN 25 10/14/2020   Lab Results  Component Value Date   URKYHCWC37 628 10/14/2020   Lab Results  Component Value Date   FOLATE 8.6 10/14/2020       Imaging Studies: CT Abdomen Pelvis Wo Contrast  Result Date: 10/14/2020 CLINICAL DATA:  Evaluate for infection EXAM: CT CHEST, ABDOMEN AND PELVIS WITHOUT CONTRAST TECHNIQUE: Multidetector CT imaging of the chest, abdomen and pelvis was performed following the standard protocol without IV contrast. COMPARISON:  CT abdomen and pelvis dated December 26, 2018 FINDINGS: CT CHEST FINDINGS Cardiovascular: Cardiomegaly. No pericardial effusion. Three-vessel coronary artery calcifications. Mitral annular calcifications. Atherosclerotic disease of the thoracic aorta. Mediastinum/Nodes: Mildly enlarged mediastinal lymph nodes which are likely reactive. Reference right pulmonary ligament lymph node measuring 1.4 cm in short axis on series 2, image 31. Esophagus and thyroid are unremarkable. Lungs/Pleura: Central airways are patent. Small bilateral pleural effusions and atelectasis. Ground-glass opacities of the bilateral upper lungs. Solid pulmonary nodule of the right lower lobe measuring 3 mm in mean diameter on series 3, image 78. Musculoskeletal: No chest wall mass or suspicious bone lesions identified. CT ABDOMEN PELVIS FINDINGS Hepatobiliary: Nodular liver contour. Gallbladder is  unremarkable. No biliary ductal dilation. Pancreas: Mild peripancreatic fat stranding, likely due to volume status given normal serum lipase. Otherwise unremarkable. Spleen: Unremarkable. Adrenals/Urinary Tract: Bilateral adrenal glands are unremarkable. Cortical thinning of the bilateral kidneys. No evidence of hydronephrosis or nephrolithiasis. Distended urinary bladder. Stomach/Bowel:  Stomach is within normal limits. Prior right hemi colectomy. No evidence of bowel wall thickening, distention, or inflammatory changes. Vascular/Lymphatic: Aortic atherosclerosis. No enlarged abdominal or pelvic lymph nodes. Reproductive: Prostate is unremarkable. Other: Small volume abdominal ascites. Diffuse soft tissue anasarca. Musculoskeletal: No acute or significant osseous findings. IMPRESSION: Mild bilateral ground-glass opacities which are most prominent in the upper lungs, favor pulmonary edema, although an infectious process could appear similar. Small bilateral pleural effusions, small volume abdominal ascites, and soft tissue anasarca, likely due to volume status. Nodular liver contour, suggestive of cirrhosis. Solid pulmonary nodule the right lower lobe measuring 3 mm. No follow-up needed if patient is low-risk. Non-contrast chest CT can be considered in 12 months if patient is high-risk. This recommendation follows the consensus statement: Guidelines for Management of Incidental Pulmonary Nodules Detected on CT Images: From the Fleischner Society 2017; Radiology 2017; 284:228-243. Aortic Atherosclerosis (ICD10-I70.0). Electronically Signed   By: Yetta Glassman M.D.   On: 10/14/2020 13:11   CT Chest Wo Contrast  Result Date: 10/14/2020 CLINICAL DATA:  Evaluate for infection EXAM: CT CHEST, ABDOMEN AND PELVIS WITHOUT CONTRAST TECHNIQUE: Multidetector CT imaging of the chest, abdomen and pelvis was performed following the standard protocol without IV contrast. COMPARISON:  CT abdomen and pelvis dated December 26, 2018 FINDINGS: CT CHEST FINDINGS Cardiovascular: Cardiomegaly. No pericardial effusion. Three-vessel coronary artery calcifications. Mitral annular calcifications. Atherosclerotic disease of the thoracic aorta. Mediastinum/Nodes: Mildly enlarged mediastinal lymph nodes which are likely reactive. Reference right pulmonary ligament lymph node measuring 1.4 cm in short axis on series 2,  image 31. Esophagus and thyroid are unremarkable. Lungs/Pleura: Central airways are patent. Small bilateral pleural effusions and atelectasis. Ground-glass opacities of the bilateral upper lungs. Solid pulmonary nodule of the right lower lobe measuring 3 mm in mean diameter on series 3, image 78. Musculoskeletal: No chest wall mass or suspicious bone lesions identified. CT ABDOMEN PELVIS FINDINGS Hepatobiliary: Nodular liver contour. Gallbladder is unremarkable. No biliary ductal dilation. Pancreas: Mild peripancreatic fat stranding, likely due to volume status given normal serum lipase. Otherwise unremarkable. Spleen: Unremarkable. Adrenals/Urinary Tract: Bilateral adrenal glands are unremarkable. Cortical thinning of the bilateral kidneys. No evidence of hydronephrosis or nephrolithiasis. Distended urinary bladder. Stomach/Bowel: Stomach is within normal limits. Prior right hemi colectomy. No evidence of bowel wall thickening, distention, or inflammatory changes. Vascular/Lymphatic: Aortic atherosclerosis. No enlarged abdominal or pelvic lymph nodes. Reproductive: Prostate is unremarkable. Other: Small volume abdominal ascites. Diffuse soft tissue anasarca. Musculoskeletal: No acute or significant osseous findings. IMPRESSION: Mild bilateral ground-glass opacities which are most prominent in the upper lungs, favor pulmonary edema, although an infectious process could appear similar. Small bilateral pleural effusions, small volume abdominal ascites, and soft tissue anasarca, likely due to volume status. Nodular liver contour, suggestive of cirrhosis. Solid pulmonary nodule the right lower lobe measuring 3 mm. No follow-up needed if patient is low-risk. Non-contrast chest CT can be considered in 12 months if patient is high-risk. This recommendation follows the consensus statement: Guidelines for Management of Incidental Pulmonary Nodules Detected on CT Images: From the Fleischner Society 2017; Radiology 2017;  284:228-243. Aortic Atherosclerosis (ICD10-I70.0). Electronically Signed   By: Yetta Glassman M.D.   On: 10/14/2020 13:11   DG Chest [facility]  Result Date: 10/15/2020 CLINICAL DATA:  Pulmonary edema. EXAM: PORTABLE CHEST 1 VIEW COMPARISON:  October 14, 2020. FINDINGS: Stable cardiomegaly. Mild central pulmonary vascular congestion is noted. Bibasilar pulmonary edema or infiltrate is noted. Small bilateral pleural effusions are noted. Bony thorax is unremarkable. IMPRESSION: Stable cardiomegaly with mild central pulmonary vascular congestion. Bibasilar pulmonary edema or infiltrates are noted. Small bilateral pleural effusions are noted. Electronically Signed   By: Marijo Conception M.D.   On: 10/15/2020 08:10   DG Chest Port 1 View  Result Date: 10/14/2020 CLINICAL DATA:  Cough and chest pain. EXAM: PORTABLE CHEST 1 VIEW COMPARISON:  06/24/2020 FINDINGS: Stable cardiac enlargement. Aortic atherosclerosis. Central pulmonary vascular congestion is noted as before. New asymmetric airspace opacity is identified within the right upper lobe. Chronic pleural thickening along the lateral left base is again seen. IMPRESSION: 1. New right upper lobe airspace opacity compatible with pneumonia. 2. Cardiac enlargement and pulmonary vascular congestion. Electronically Signed   By: Kerby Moors M.D.   On: 10/14/2020 11:34   [place] ASCITES (ABDOMEN LIMITED)  Result Date: 10/14/2020 CLINICAL DATA:  Abdominal distension question ascites. History CHF, cardiomyopathy, coronary artery disease, colon cancer, diabetes mellitus, hypertension EXAM: LIMITED ABDOMEN ULTRASOUND FOR ASCITES TECHNIQUE: Limited ultrasound survey for ascites was performed in all four abdominal quadrants. COMPARISON:  CT abdomen and pelvis 10/14/2020 FINDINGS: Small collection of ascites in LEFT lower quadrant, containing bowel loops. No other significant ascites seen. Volume of ascites is insufficient for paracentesis. IMPRESSION: Low volume  ascites insufficient for paracentesis. Electronically Signed   By: Lavonia Dana M.D.   On: 10/14/2020 15:49  [2 weeks]   Assessment: COLT MARTELLE is a 70 y.o. year old male with history of a fib on chronic anticoagulation (coumadin), cardiomyopathy, MI, pulmonary htn and is on chronic 2L O2. Patient presented to ED with c/o indigestion x3 days as well as belching, nausea and abdominal distention with soreness. Found to have hgb of 7.1 with FOBT positive in ED. GI consulted for further evaluation of anemia well as new findings of cirrhosis.  Anemia: patient has hx of IDA, he was hospitalized at [facility] in June 2022 after he presented with episode of syncope and found to have hgb of 9, INR 3.6, he had melena and FOBT positive.he underwent push enteroscopy with Dr. Carlean Purl without significant findings other than chronic gastritis, did not feel that patient could safely have givens capsule study at that time. He was started on iron supplementation thereafter. He has been seen in the past for IDA by Dr. Laural Golden in 2020 with EGD and Colonoscopy w/o significant etiologic findings for his anemia, multiple tubular adenomas.   Hgb 7.1 on admission, down from 9.4 in 08/2020. Today Hgb 5.8. Platelets 129,000. Ferriten 25, iron 15, B12/folate normal. He has had no overt bleeding. He does have heme+stool. 2 units of prbcs ordered. Ultimately needs colonoscopy this admission +/- capsule endoscopy. Currently INR remains too elevated at 3.2 today. Received vitamin K oral 40m yesterday and 7.580mtoday. Patient with pulmonary edema on today's xray.  Cirrhosis: new diagnosis based on current CT imaging. Likely due to NASH +/- previous etoh use. Recent abdominal wall edema, lower extremity edema. U/S yesterday with low volume ascites, insufficient for paracentesis. Doppler [place] liver planned for today. Will follow up extensive serologies.    Plan: Follow up liver doppler today.  Start clear liquids after lunch today in  preparation for inpatient colonoscopy.  F/u pending serologies.  Transfuse as needed.  Hold anticoagulation.  Agree with vitamin K.  Continue PPI BID IV.   Laureen Ochs. Bernarda Caffey [facility] [phone] 10/13/202211:50 AM    LOS: 1 day

## 2020-10-16 ENCOUNTER — Inpatient Hospital Stay (HOSPITAL_COMMUNITY): Payer: Medicare Other

## 2020-10-16 DIAGNOSIS — I5023 Acute on chronic systolic (congestive) heart failure: Secondary | ICD-10-CM

## 2020-10-16 DIAGNOSIS — K7460 Unspecified cirrhosis of liver: Secondary | ICD-10-CM | POA: Diagnosis not present

## 2020-10-16 DIAGNOSIS — R195 Other fecal abnormalities: Secondary | ICD-10-CM | POA: Diagnosis not present

## 2020-10-16 DIAGNOSIS — R188 Other ascites: Secondary | ICD-10-CM | POA: Diagnosis not present

## 2020-10-16 DIAGNOSIS — I5033 Acute on chronic diastolic (congestive) heart failure: Secondary | ICD-10-CM

## 2020-10-16 LAB — BASIC METABOLIC PANEL
Anion gap: 7 (ref 5–15)
BUN: 68 mg/dL — ABNORMAL HIGH (ref 8–23)
CO2: 24 mmol/L (ref 22–32)
Calcium: 8.3 mg/dL — ABNORMAL LOW (ref 8.9–10.3)
Chloride: 106 mmol/L (ref 98–111)
Creatinine, Ser: 2.49 mg/dL — ABNORMAL HIGH (ref 0.61–1.24)
GFR, Estimated: 27 mL/min — ABNORMAL LOW (ref >60)
Glucose, Bld: 108 mg/dL — ABNORMAL HIGH (ref 70–99)
Potassium: 3.8 mmol/L (ref 3.5–5.1)
Sodium: 137 mmol/L (ref 135–145)

## 2020-10-16 LAB — MAGNESIUM: Magnesium: 2.2 mg/dL (ref 1.7–2.4)

## 2020-10-16 LAB — ECHOCARDIOGRAM COMPLETE
AR max vel: 1.87 cm2
AV Area VTI: 2.01 cm2
AV Area mean vel: 2.03 cm2
AV Mean grad: 4 mmHg
AV Peak grad: 9 mmHg
Ao pk vel: 1.5 m/s
Area-P 1/2: 3.61 cm2
Height: 64 in
MV VTI: 1.74 cm2
S' Lateral: 3.5 cm
Weight: 4398.62 oz

## 2020-10-16 LAB — CBC
HCT: 26.3 % — ABNORMAL LOW (ref 39.0–52.0)
Hemoglobin: 7.7 g/dL — ABNORMAL LOW (ref 13.0–17.0)
MCH: 26 pg (ref 26.0–34.0)
MCHC: 29.3 g/dL — ABNORMAL LOW (ref 30.0–36.0)
MCV: 88.9 fL (ref 80.0–100.0)
Platelets: 123 10*3/uL — ABNORMAL LOW (ref 150–400)
RBC: 2.96 MIL/uL — ABNORMAL LOW (ref 4.22–5.81)
RDW: 17.9 % — ABNORMAL HIGH (ref 11.5–15.5)
WBC: 4.4 10*3/uL (ref 4.0–10.5)
nRBC: 1.1 % — ABNORMAL HIGH (ref 0.0–0.2)

## 2020-10-16 LAB — GLUCOSE, CAPILLARY
Glucose-Capillary: 125 mg/dL — ABNORMAL HIGH (ref 70–99)
Glucose-Capillary: 109 mg/dL — ABNORMAL HIGH (ref 70–99)
Glucose-Capillary: 120 mg/dL — ABNORMAL HIGH (ref 70–99)
Glucose-Capillary: 116 mg/dL — ABNORMAL HIGH (ref 70–99)

## 2020-10-16 LAB — IGG, IGA, IGM
IgA: 529 mg/dL — ABNORMAL HIGH (ref 61–437)
IgG (Immunoglobin G), Serum: 981 mg/dL (ref 603–1613)
IgM (Immunoglobulin M), Srm: 21 mg/dL (ref 20–172)

## 2020-10-16 LAB — PROTIME-INR
INR: 1.8 — ABNORMAL HIGH (ref 0.8–1.2)
Prothrombin Time: 20.8 seconds — ABNORMAL HIGH (ref 11.4–15.2)

## 2020-10-16 LAB — BRAIN NATRIURETIC PEPTIDE: B Natriuretic Peptide: 341 pg/mL — ABNORMAL HIGH (ref 0.0–100.0)

## 2020-10-16 LAB — ALPHA-1-ANTITRYPSIN: A-1 Antitrypsin, Ser: 203 mg/dL — ABNORMAL HIGH (ref 101–187)

## 2020-10-16 LAB — MITOCHONDRIAL ANTIBODIES: Mitochondrial M2 Ab, IgG: <20 Units (ref 0.0–20.0)

## 2020-10-16 LAB — PREPARE RBC (CROSSMATCH)

## 2020-10-16 LAB — AFP TUMOR MARKER: AFP, Serum, Tumor Marker: 1 ng/mL (ref 0.0–8.4)

## 2020-10-16 LAB — CERULOPLASMIN: Ceruloplasmin: 33.3 mg/dL — ABNORMAL HIGH (ref 16.0–31.0)

## 2020-10-16 LAB — ANTI-SMOOTH MUSCLE ANTIBODY, IGG: F-Actin IgG: 22 Units — ABNORMAL HIGH (ref 0–19)

## 2020-10-16 LAB — AMMONIA: Ammonia: 49 umol/L — ABNORMAL HIGH (ref 9–35)

## 2020-10-16 MED ORDER — PERFLUTREN LIPID MICROSPHERE
1.0000 mL | INTRAVENOUS | Status: AC | PRN
  Administered 2020-10-16: 3 mL via INTRAVENOUS
  Filled 2020-10-16: qty 10

## 2020-10-16 MED ORDER — PHENOL 1.4 % MT LIQD
1.0000 | OROMUCOSAL | Status: DC | PRN
  Administered 2020-10-16: 1 via OROMUCOSAL
  Filled 2020-10-16: qty 177

## 2020-10-16 MED ORDER — SODIUM CHLORIDE 0.9% IV SOLUTION: Freq: Once | INTRAVENOUS | Status: AC

## 2020-10-16 MED ORDER — PEG 3350-KCL-NABCB-NACL-NASULF 236 G PO SOLR
4000.0000 mL | Freq: Once | ORAL | Status: AC
  Administered 2020-10-16: 4000 mL via ORAL
  Filled 2020-10-16: qty 4000

## 2020-10-16 MED ORDER — BISACODYL 5 MG PO TBEC
10.0000 mg | DELAYED_RELEASE_TABLET | Freq: Once | ORAL | Status: AC
  Administered 2020-10-16: 10 mg via ORAL
  Filled 2020-10-16: qty 2

## 2020-10-16 NOTE — Progress Notes (Addendum)
Subjective:  Breathing is improved. Can lay down comfortably. Abdomen slightly less distended. Lower extremities still swollen but he feels it is better. No n/v. Last BM yesterday. No melena, brbpr. Feels better after getting blood yesterday.   Objective: Vital signs in last 24 hours: Temp:  [97.6 F (36.4 C)-99.1 F (37.3 C)] 98.4 F (36.9 C) (10/14 0507) Pulse Rate:  [75-103] 80 (10/14 0907) Resp:  [17-20] 20 (10/13 1702) BP: (108-119)/(67-89) 117/74 (10/14 0907) SpO2:  [98 %-100 %] 100 % (10/14 0915) Weight:  [124.7 kg] 124.7 kg (10/14 0500) Last BM Date: 10/15/20 General:   Alert,  chronically ill appearing male, resting comfortably in bed. pleasant and cooperative in NAD Head:  Normocephalic and atraumatic. Eyes:  Sclera clear, no icterus.  Chest: CTA bilaterally without rales,  crackles.  Scattered rhonchi.  Heart:  Regular rate and rhythm; no murmurs, clicks, rubs,  or gallops. Abdomen:  Soft, obese, pitting edema in lower abdomen. Small umb hernia easily reducible. Nontender. Normal bowel sounds, without guarding, and without rebound.   Extremities:  Without clubbing, deformity. 1+ pitting edema bilaterally. Neurologic:  Alert and  oriented x4;  grossly normal neurologically. Skin:  Intact without significant lesions or rashes. Psych:  Alert and cooperative. Normal mood and affect.  Intake/Output from previous day: 10/13 0701 - 10/14 0700 In: 1866 [P.O.:956; Blood:910] Out: -  Intake/Output this shift: Total I/O In: 480 [P.O.:480] Out: -   Lab Results: CBC Recent Labs    10/14/20 1102 10/15/20 0538 10/16/20 0529  WBC 6.0 4.2 4.4  HGB 7.1* 5.8* 7.7*  HCT 25.8* 21.9* 26.3*  MCV 89.3 89.8 88.9  PLT 162 129* 123*   BMET Recent Labs    10/14/20 1102 10/15/20 0538 10/16/20 0529  NA 137 136 137  K 4.1 4.2 3.8  CL 105 105 106  CO2 24 22 24   GLUCOSE 151* 122* 108*  BUN 78* 75* 68*  CREATININE 2.68* 2.70* 2.49*  CALCIUM 8.6* 8.5* 8.3*   LFTs Recent Labs     10/14/20 1102  BILITOT 0.9  ALKPHOS 75  AST 34  ALT 31  PROT 7.0  ALBUMIN 3.6   Recent Labs    10/14/20 1102  LIPASE 44   PT/INR Recent Labs    10/14/20 1102 10/15/20 0538 10/16/20 0529  LABPROT 36.2* 32.8* 20.8*  INR 3.6* 3.2* 1.8*      Imaging Studies: CT Abdomen Pelvis Wo Contrast  Result Date: 10/14/2020 CLINICAL DATA:  Evaluate for infection EXAM: CT CHEST, ABDOMEN AND PELVIS WITHOUT CONTRAST TECHNIQUE: Multidetector CT imaging of the chest, abdomen and pelvis was performed following the standard protocol without IV contrast. COMPARISON:  CT abdomen and pelvis dated December 26, 2018 FINDINGS: CT CHEST FINDINGS Cardiovascular: Cardiomegaly. No pericardial effusion. Three-vessel coronary artery calcifications. Mitral annular calcifications. Atherosclerotic disease of the thoracic aorta. Mediastinum/Nodes: Mildly enlarged mediastinal lymph nodes which are likely reactive. Reference right pulmonary ligament lymph node measuring 1.4 cm in short axis on series 2, image 31. Esophagus and thyroid are unremarkable. Lungs/Pleura: Central airways are patent. Small bilateral pleural effusions and atelectasis. Ground-glass opacities of the bilateral upper lungs. Solid pulmonary nodule of the right lower lobe measuring 3 mm in mean diameter on series 3, image 78. Musculoskeletal: No chest wall mass or suspicious bone lesions identified. CT ABDOMEN PELVIS FINDINGS Hepatobiliary: Nodular liver contour. Gallbladder is unremarkable. No biliary ductal dilation. Pancreas: Mild peripancreatic fat stranding, likely due to volume status given normal serum lipase. Otherwise unremarkable. Spleen: Unremarkable. Adrenals/Urinary Tract: Bilateral adrenal  glands are unremarkable. Cortical thinning of the bilateral kidneys. No evidence of hydronephrosis or nephrolithiasis. Distended urinary bladder. Stomach/Bowel: Stomach is within normal limits. Prior right hemi colectomy. No evidence of bowel wall  thickening, distention, or inflammatory changes. Vascular/Lymphatic: Aortic atherosclerosis. No enlarged abdominal or pelvic lymph nodes. Reproductive: Prostate is unremarkable. Other: Small volume abdominal ascites. Diffuse soft tissue anasarca. Musculoskeletal: No acute or significant osseous findings. IMPRESSION: Mild bilateral ground-glass opacities which are most prominent in the upper lungs, favor pulmonary edema, although an infectious process could appear similar. Small bilateral pleural effusions, small volume abdominal ascites, and soft tissue anasarca, likely due to volume status. Nodular liver contour, suggestive of cirrhosis. Solid pulmonary nodule the right lower lobe measuring 3 mm. No follow-up needed if patient is low-risk. Non-contrast chest CT can be considered in 12 months if patient is high-risk. This recommendation follows the consensus statement: Guidelines for Management of Incidental Pulmonary Nodules Detected on CT Images: From the Fleischner Society 2017; Radiology 2017; 284:228-243. Aortic Atherosclerosis (ICD10-I70.0). Electronically Signed   By: Yetta Glassman M.D.   On: 10/14/2020 13:11   CT Chest Wo Contrast  Result Date: 10/14/2020 CLINICAL DATA:  Evaluate for infection EXAM: CT CHEST, ABDOMEN AND PELVIS WITHOUT CONTRAST TECHNIQUE: Multidetector CT imaging of the chest, abdomen and pelvis was performed following the standard protocol without IV contrast. COMPARISON:  CT abdomen and pelvis dated December 26, 2018 FINDINGS: CT CHEST FINDINGS Cardiovascular: Cardiomegaly. No pericardial effusion. Three-vessel coronary artery calcifications. Mitral annular calcifications. Atherosclerotic disease of the thoracic aorta. Mediastinum/Nodes: Mildly enlarged mediastinal lymph nodes which are likely reactive. Reference right pulmonary ligament lymph node measuring 1.4 cm in short axis on series 2, image 31. Esophagus and thyroid are unremarkable. Lungs/Pleura: Central airways are patent.  Small bilateral pleural effusions and atelectasis. Ground-glass opacities of the bilateral upper lungs. Solid pulmonary nodule of the right lower lobe measuring 3 mm in mean diameter on series 3, image 78. Musculoskeletal: No chest wall mass or suspicious bone lesions identified. CT ABDOMEN PELVIS FINDINGS Hepatobiliary: Nodular liver contour. Gallbladder is unremarkable. No biliary ductal dilation. Pancreas: Mild peripancreatic fat stranding, likely due to volume status given normal serum lipase. Otherwise unremarkable. Spleen: Unremarkable. Adrenals/Urinary Tract: Bilateral adrenal glands are unremarkable. Cortical thinning of the bilateral kidneys. No evidence of hydronephrosis or nephrolithiasis. Distended urinary bladder. Stomach/Bowel: Stomach is within normal limits. Prior right hemi colectomy. No evidence of bowel wall thickening, distention, or inflammatory changes. Vascular/Lymphatic: Aortic atherosclerosis. No enlarged abdominal or pelvic lymph nodes. Reproductive: Prostate is unremarkable. Other: Small volume abdominal ascites. Diffuse soft tissue anasarca. Musculoskeletal: No acute or significant osseous findings. IMPRESSION: Mild bilateral ground-glass opacities which are most prominent in the upper lungs, favor pulmonary edema, although an infectious process could appear similar. Small bilateral pleural effusions, small volume abdominal ascites, and soft tissue anasarca, likely due to volume status. Nodular liver contour, suggestive of cirrhosis. Solid pulmonary nodule the right lower lobe measuring 3 mm. No follow-up needed if patient is low-risk. Non-contrast chest CT can be considered in 12 months if patient is high-risk. This recommendation follows the consensus statement: Guidelines for Management of Incidental Pulmonary Nodules Detected on CT Images: From the Fleischner Society 2017; Radiology 2017; 284:228-243. Aortic Atherosclerosis (ICD10-I70.0). Electronically Signed   By: Yetta Glassman  M.D.   On: 10/14/2020 13:11   DG Chest Port 1 View  Result Date: 10/15/2020 CLINICAL DATA:  Pulmonary edema. EXAM: PORTABLE CHEST 1 VIEW COMPARISON:  October 14, 2020. FINDINGS: Stable cardiomegaly. Mild central pulmonary vascular congestion  is noted. Bibasilar pulmonary edema or infiltrate is noted. Small bilateral pleural effusions are noted. Bony thorax is unremarkable. IMPRESSION: Stable cardiomegaly with mild central pulmonary vascular congestion. Bibasilar pulmonary edema or infiltrates are noted. Small bilateral pleural effusions are noted. Electronically Signed   By: Marijo Conception M.D.   On: 10/15/2020 08:10   DG Chest Port 1 View  Result Date: 10/14/2020 CLINICAL DATA:  Cough and chest pain. EXAM: PORTABLE CHEST 1 VIEW COMPARISON:  06/24/2020 FINDINGS: Stable cardiac enlargement. Aortic atherosclerosis. Central pulmonary vascular congestion is noted as before. New asymmetric airspace opacity is identified within the right upper lobe. Chronic pleural thickening along the lateral left base is again seen. IMPRESSION: 1. New right upper lobe airspace opacity compatible with pneumonia. 2. Cardiac enlargement and pulmonary vascular congestion. Electronically Signed   By: Kerby Moors M.D.   On: 10/14/2020 11:34   [place] ASCITES (ABDOMEN LIMITED)  Result Date: 10/14/2020 CLINICAL DATA:  Abdominal distension question ascites. History CHF, cardiomyopathy, coronary artery disease, colon cancer, diabetes mellitus, hypertension EXAM: LIMITED ABDOMEN ULTRASOUND FOR ASCITES TECHNIQUE: Limited ultrasound survey for ascites was performed in all four abdominal quadrants. COMPARISON:  CT abdomen and pelvis 10/14/2020 FINDINGS: Small collection of ascites in LEFT lower quadrant, containing bowel loops. No other significant ascites seen. Volume of ascites is insufficient for paracentesis. IMPRESSION: Low volume ascites insufficient for paracentesis. Electronically Signed   By: Lavonia Dana M.D.   On: 10/14/2020  15:49   US LIVER DOPPLER  Result Date: 10/15/2020 CLINICAL DATA:  Cirrhosis. EXAM: DUPLEX ULTRASOUND OF LIVER TECHNIQUE: Color and duplex Doppler ultrasound was performed to evaluate the hepatic in-flow and out-flow vessels. COMPARISON:  10/14/2020 and CT 10/14/2020 FINDINGS: Liver: Nodular contour compatible with cirrhosis. Perihepatic ascites. Main Portal Vein size: 1.3 cm Portal Vein Velocities Main Prox:  54 cm/sec Main Mid: 47 cm/sec Main Dist:  24 cm/sec Right: 42 cm/sec Left: 27 cm/sec Hepatic Vein Velocities Right:  64 cm/sec Middle:  59 cm/sec Left:  87 cm/sec IVC: Present and patent with normal respiratory phasicity. Hepatic Artery Velocity:  95 cm/sec Splenic Vein Velocity:  23 cm/sec Spleen: 17.1 cm x 18.7 cm x 7.3 cm with a total volume of 1229 cm^3 (411 cm^3 is upper limit normal) Portal Vein Occlusion/Thrombus: No Splenic Vein Occlusion/Thrombus: No Ascites: Present Varices: Limited evaluation Normal hepatopetal flow in the portal veins. Normal hepatofugal flow in the hepatic veins. IMPRESSION: 1. Cirrhosis with splenomegaly and perihepatic ascites. Findings are suggestive for portal hypertension. 2. Portal venous system is patent with normal direction of flow. Electronically Signed   By: Markus Daft M.D.   On: 10/15/2020 11:43  [2 weeks]   Assessment: Pleasant 70 year old male with history of A. Fib on chronic anticoagulation (coumadin), cardiomyopathy, MI, pulmonary htn and is on chronic 2L O2. Patient presented to ED with c/o indigestion x3 days as well as belching, nausea and abdominal distention with soreness. Found to have hgb of 7.1 with FOBT positive in ED. GI consulted for further evaluation of anemia well as new findings of cirrhosis.  Anemia: History of IDA, hospitalized at [facility] in June 2022 when he presented with syncopal episode vs cardiac arrest and found to have hemoglobin of 9, INR 3.6, melena, heme positive stool.  Push enteroscopy completed without significant  findings other than chronic gastritis, it was felt the patient could not tolerate VCE (video capsule endoscopy) at that time due to significant chest wall pain from CPR (too sore to wear box transmitter). Prior work-up for  IDA by Dr. Laural Golden in 2020 with EGD and colonoscopy without significant etiologic findings for his anemia, multiple tubular adenomas.  Hemoglobin 7.1 on admission, down from 9.06 August 2020.  Hemoglobin dropped to 5.8.  He received 2 units of packed red blood cells with appropriate response.  Ferritin 25, iron 15, B12/folate normal.  No overt GI bleeding, he was heme positive.  Colonoscopy has been on hold due to elevated INR greater than 3.  Today's INR is 1.8.  His breathing has improved.  Appropriate for bowel prep today.  Cirrhosis: New diagnosis based on current CT findings.  Likely due to NASH plus or minus prior alcohol use.  Recently developed abdominal wall edema, worsening lower extremity edema.  No significant ascites on ultrasound for paracentesis.  Doppler ultrasound with evidence of cirrhosis and portal hypertension.  Portal venous system patent with normal directional flow.  No portal vein occlusion or thrombus.  Plan: Bowel prep today.  Plan for colonoscopy tomorrow.  I have discussed the risks, alternatives, benefits with regards to but not limited to the risk of reaction to medication, bleeding, infection, perforation and the patient is agreeable to proceed. Written consent to be obtained. Follow-up pending serologies as available.  Laureen Ochs. Bernarda Caffey [facility] [phone] 10/14/202211:03 AM    LOS: 2 days

## 2020-10-16 NOTE — Progress Notes (Signed)
Pushed 2 mL of contrast at 1340 and 1 mL at 1342 to assist with echocardiogram.

## 2020-10-16 NOTE — Progress Notes (Signed)
PROGRESS NOTE   KARTEL WOLBERT  JQB:[id number] DOB: 1950-07-04 DOA: 10/14/2020 PCP: [name], [name], MD   Chief Complaint  Patient presents with   Gastroesophageal Reflux   Level of care: Telemetry  Brief Admission History:  [name] is a medically complex gentleman who presents with multiple complaints including increasing generalized weakness and debility, increasing abdominal distention, uncontrolled acid reflux symptoms, shortness of breath, inability to lie recumbent, DOE, increasing edema in lower extremities, occasional black stools, and worsening LUQ abd pain.  He is followed by the heart failure team in [place].  He has pulmonary hypertension and is on sildenafil TID. He was found to be anemic, hemoglobin at 7.1 on admission 10/12,  5.8 on 10/13, requiring 2x PRBC, now 7.7 10/14. Warfarin was stopped, vitamin K given, IV lasix was given for his abdominal edema and small volume ascites. IV protonix bid has been continued. Pulmonary edema and pleural effusions noted 10/13 on CXR, lasix 81m bid continued. Gi plans for colonoscopy 10/15, bowel prep in process today 10/14.   Assessment & Plan:   Principal Problem:   GI bleed -hemoccult positive, anemic, increased BUN, suspicious for bleeding gastric ulcer -anemic 5.8 normocytic, 2x PRBC ordered 10/13 -hemoglobin 7.7 after 2x prbc  -GI plans colonoscopy 10/15   Active Problems:     Fecal occult blood test positive -positive 10/14/20 with reported dark, tarry stools -GI pans colonoscopy 10/15, bowel prep underway    Acute blood loss anemia -presumed GI bleed, GI consulted, transfused, stable at this time    Normocytic anemia -10/12 hemoglobin of 7.1 -10/13, hemoglobin of 5.8 with two units of PRBC ordered 10/14 hgb 7.7 -GI consulted for GI bleed, colonoscopy planned -continue po iron     Anticoagulated on Coumadin -ceased coumadin 10/12 -574mvitamin K given 10/12 -7.5 mg vitamin k  10/13 -Initial INR 3.6  on 10/12,  10/13 3.2, 10/14 1.8     Acquired thrombophilia (HCC) -platelets 162 10/12 -platelets 123 10/14 -will continue to monitor     Morbid obesity with BMI of 45.0-49.9, adult (HCDar[place]-outpatient lifestyle modifications -plausible obesity hypoventilation     Liver cirrhosis ([place]CEast [place]-suggested on CT abdomen, Gi consulted -abdominal ultrasound shows cirrhosis with splenomegaly and perihepatic ascites. Findings are suggestive for portal hypertension. Portal venous system is patent with normal direction of flow.  -Low volume ascites insufficient for paracentesis     Gout -no current complaints -continue allopurinol     Hypertensive heart disease -currently normotensive, significant RV dilation from pulmonary vascular hypertension -echocardiogram ordered -last echo 06/21/20 50/55% LVEF     History of colon cancer -previous colonoscopy with several polyps removed     Right heart failure (HCC) -significant right ventricle dilation with evidence of pulmonary artery hypertension -bnp 10/12 340, 10/13 273, 10/14 341     OSA (obstructive sleep apnea) -uses CPAP at home     Nonischemic cardiomyopathy (HCIola-complete cardiac echo ordered - last echo 06/21/20 50/55% LVEF     Atrial fibrillation with slow ventricular response (HCC) -currently in Afib, rate in the 80's -was anticoagulated INR 3.6 -Stopped coumadin due to gi bleed     DM (diabetes mellitus), type 2 with renal complications (HCC) -[id number] 8+ -SSI -carb modified diet     Essential hypertension -continue metoprolol -hold farxiga -hold torsemide -continue lasix IV 4062mid     MGUS (monoclonal gammopathy of unknown significance)     CKD stage 3 due to type 2 diabetes mellitus (HCCMillvilleConsider consulting nephrology -  baseline creatinine 2-2.2,  2.68 10/14/20, 10/13 2.7, 10/14 2.49 -does not appear dehydrated and questionable if this is a progression of his CKD -monitoring renal function     Hyperlipidemia  associated with type 2 diabetes mellitus ([place]) -continue atorvastatin     Pulmonary hypertension (HCC) -continue sildenafil    Peripheral edema /  Bilateral leg edema -Lasix 89m bid -currently no concern for DVT as he was very recently supraterapeutic INR -monitoring electrolytes and renal function    Acute on chronic congestive heart failure (HCC) -echocardiogram ordered -Intake and output tracking -trending BNP   DVT prophylaxis:  scd Code Status: full Family Communication: none at bedside Disposition: stabilize for GI to determine source of GI bleed, then anticipate home  Status is: Inpatient  Remains inpatient appropriate because: blood loss requiring transfusion and possibility for need to repeat       Consultants:  GI  Procedures:  Liver [place] CXR [place] Colonoscopy planned 10/15  Antimicrobials:  -ceftriaxone 10/13- current, SBP prophylaxis per gi  Subjective: Reports improved LUQ pain, improved/relief from belching, improved nausea, he reports his foot pain is relieved after the transfusion, reports he was able to lie flat and sleep last night    Objective: Vitals:   10/16/20 0500 10/16/20 0507 10/16/20 0907 10/16/20 0915  BP:  115/74 117/74   Pulse:  79 80   Resp:      Temp:  98.4 F (36.9 C)    TempSrc:  Oral    SpO2:  100%  100%  Weight: 124.7 kg     Height:        Intake/Output Summary (Last 24 hours) at 10/16/2020 1223 Last data filed at 10/16/2020 0915 Gross per 24 hour  Intake 1870 ml  Output --  Net 1870 ml   Filed Weights   10/14/20 1036 10/14/20 2057 10/16/20 0500  Weight: 121.6 kg 121.6 kg 124.7 kg    Examination:  General exam: Appears tired, improved pallor, conversational   Respiratory system: Clear to auscultation. Respiratory effort normal, limited inspiration due to abdominal size   Cardiovascular system: normal S1 & S2 heard. No JVD, murmurs, rubs, gallops or clicks. Some pedal edema. Decreased pedal  pulses bilaterally, less than 2 seconds capillary refill in his feel bilaterally   Gastrointestinal system: Abdomen is large, reduced lower abd edema compared to yesterday, mild LUQ tenderness, non tympanic, with present bowel sounds   Central nervous system: Alert and oriented. No focal neurological deficits.   Extremities: moves all extremities appropriately, reports improved foot apin, reports worsened when his hemoglobin began to decline, decreased pedal pulses with normal cap refill in his feet   Skin: excoriations, no overt bruising or rashes   Psychiatry: Judgement and insight appear normal. Mood & affect appropriate.   Data Reviewed: I have personally reviewed following labs and imaging studies  CBC: Recent Labs  Lab 10/14/20 1102 10/15/20 0538 10/16/20 0529  WBC 6.0 4.2 4.4  NEUTROABS 4.4  --   --   HGB 7.1* 5.8* 7.7*  HCT 25.8* 21.9* 26.3*  MCV 89.3 89.8 88.9  PLT 162 129* 123*    Basic Metabolic Panel: Recent Labs  Lab 10/14/20 1102 10/15/20 0538 10/16/20 0529  NA 137 136 137  K 4.1 4.2 3.8  CL 105 105 106  CO2 24 22 24   GLUCOSE 151* 122* 108*  BUN 78* 75* 68*  CREATININE 2.68* 2.70* 2.49*  CALCIUM 8.6* 8.5* 8.3*  MG  --   --  2.2  GFR: Estimated Creatinine Clearance: 34.3 mL/min (A) (by C-G formula based on SCr of 2.49 mg/dL (H)).  Liver Function Tests: Recent Labs  Lab 10/14/20 1102  AST 34  ALT 31  ALKPHOS 75  BILITOT 0.9  PROT 7.0  ALBUMIN 3.6    CBG: Recent Labs  Lab 10/15/20 1128 10/15/20 1622 10/15/20 2211 10/16/20 0718 10/16/20 1129  GLUCAP 126* 135* 111* 125* 109*    Recent Results (from the past 240 hour(s))  Resp Panel by RT-PCR (Flu A&B, Covid) Nasopharyngeal Swab     Status: None   Collection Time: 10/14/20  2:15 PM   Specimen: Nasopharyngeal Swab; Nasopharyngeal(NP) swabs in vial transport medium  Result Value Ref Range Status   SARS Coronavirus 2 by RT PCR NEGATIVE NEGATIVE Final    Comment: (NOTE) SARS-CoV-2  target nucleic acids are NOT DETECTED.  The SARS-CoV-2 RNA is generally detectable in upper respiratory specimens during the acute phase of infection. The lowest concentration of SARS-CoV-2 viral copies this assay can detect is 138 copies/mL. A negative result does not preclude SARS-Cov-2 infection and should not be used as the sole basis for treatment or other patient management decisions. A negative result may occur with  improper specimen collection/handling, submission of specimen other than nasopharyngeal swab, presence of viral mutation(s) within the areas targeted by this assay, and inadequate number of viral copies(<138 copies/mL). A negative result must be combined with clinical observations, patient history, and epidemiological information. The expected result is Negative.  Fact Sheet for Patients:  EntrepreneurPulse.com.au  Fact Sheet for Healthcare Providers:  IncredibleEmployment.be  This test is no t yet approved or cleared by the [place] FDA and  has been authorized for detection and/or diagnosis of SARS-CoV-2 by FDA under an Emergency Use Authorization (EUA). This EUA will remain  in effect (meaning this test can be used) for the duration of the COVID-19 declaration under Section 564(b)(1) of the Act, 21 U.S.C.section 360bbb-3(b)(1), unless the authorization is terminated  or revoked sooner.       Influenza A by PCR NEGATIVE NEGATIVE Final   Influenza B by PCR NEGATIVE NEGATIVE Final    Comment: (NOTE) The Xpert Xpress SARS-CoV-2/FLU/RSV plus assay is intended as an aid in the diagnosis of influenza from Nasopharyngeal swab specimens and should not be used as a sole basis for treatment. Nasal washings and aspirates are unacceptable for Xpert Xpress SARS-CoV-2/FLU/RSV testing.  Fact Sheet for Patients: EntrepreneurPulse.com.au  Fact Sheet for Healthcare  Providers: IncredibleEmployment.be  This test is not yet approved or cleared by the [place] FDA and has been authorized for detection and/or diagnosis of SARS-CoV-2 by FDA under an Emergency Use Authorization (EUA). This EUA will remain in effect (meaning this test can be used) for the duration of the COVID-19 declaration under Section 564(b)(1) of the Act, 21 U.S.C. section 360bbb-3(b)(1), unless the authorization is terminated or revoked.  Performed at [facility], [address], [place], [place] [address]      Radiology Studies: CT Abdomen Pelvis Wo Contrast  Result Date: 10/14/2020 CLINICAL DATA:  Evaluate for infection EXAM: CT CHEST, ABDOMEN AND PELVIS WITHOUT CONTRAST TECHNIQUE: Multidetector CT imaging of the chest, abdomen and pelvis was performed following the standard protocol without IV contrast. COMPARISON:  CT abdomen and pelvis dated December 26, 2018 FINDINGS: CT CHEST FINDINGS Cardiovascular: Cardiomegaly. No pericardial effusion. Three-vessel coronary artery calcifications. Mitral annular calcifications. Atherosclerotic disease of the thoracic aorta. Mediastinum/Nodes: Mildly enlarged mediastinal lymph nodes which are likely reactive. Reference right pulmonary ligament lymph  node measuring 1.4 cm in short axis on series 2, image 31. Esophagus and thyroid are unremarkable. Lungs/Pleura: Central airways are patent. Small bilateral pleural effusions and atelectasis. Ground-glass opacities of the bilateral upper lungs. Solid pulmonary nodule of the right lower lobe measuring 3 mm in mean diameter on series 3, image 78. Musculoskeletal: No chest wall mass or suspicious bone lesions identified. CT ABDOMEN PELVIS FINDINGS Hepatobiliary: Nodular liver contour. Gallbladder is unremarkable. No biliary ductal dilation. Pancreas: Mild peripancreatic fat stranding, likely due to volume status given normal serum lipase. Otherwise unremarkable. Spleen: Unremarkable.  Adrenals/Urinary Tract: Bilateral adrenal glands are unremarkable. Cortical thinning of the bilateral kidneys. No evidence of hydronephrosis or nephrolithiasis. Distended urinary bladder. Stomach/Bowel: Stomach is within normal limits. Prior right hemi colectomy. No evidence of bowel wall thickening, distention, or inflammatory changes. Vascular/Lymphatic: Aortic atherosclerosis. No enlarged abdominal or pelvic lymph nodes. Reproductive: Prostate is unremarkable. Other: Small volume abdominal ascites. Diffuse soft tissue anasarca. Musculoskeletal: No acute or significant osseous findings. IMPRESSION: Mild bilateral ground-glass opacities which are most prominent in the upper lungs, favor pulmonary edema, although an infectious process could appear similar. Small bilateral pleural effusions, small volume abdominal ascites, and soft tissue anasarca, likely due to volume status. Nodular liver contour, suggestive of cirrhosis. Solid pulmonary nodule the right lower lobe measuring 3 mm. No follow-up needed if patient is low-risk. Non-contrast chest CT can be considered in 12 months if patient is high-risk. This recommendation follows the consensus statement: Guidelines for Management of Incidental Pulmonary Nodules Detected on CT Images: From the Fleischner Society 2017; Radiology 2017; 284:228-243. Aortic Atherosclerosis (ICD10-I70.0). Electronically Signed   By: Yetta Glassman M.D.   On: 10/14/2020 13:11   CT Chest Wo Contrast  Result Date: 10/14/2020 CLINICAL DATA:  Evaluate for infection EXAM: CT CHEST, ABDOMEN AND PELVIS WITHOUT CONTRAST TECHNIQUE: Multidetector CT imaging of the chest, abdomen and pelvis was performed following the standard protocol without IV contrast. COMPARISON:  CT abdomen and pelvis dated December 26, 2018 FINDINGS: CT CHEST FINDINGS Cardiovascular: Cardiomegaly. No pericardial effusion. Three-vessel coronary artery calcifications. Mitral annular calcifications. Atherosclerotic disease  of the thoracic aorta. Mediastinum/Nodes: Mildly enlarged mediastinal lymph nodes which are likely reactive. Reference right pulmonary ligament lymph node measuring 1.4 cm in short axis on series 2, image 31. Esophagus and thyroid are unremarkable. Lungs/Pleura: Central airways are patent. Small bilateral pleural effusions and atelectasis. Ground-glass opacities of the bilateral upper lungs. Solid pulmonary nodule of the right lower lobe measuring 3 mm in mean diameter on series 3, image 78. Musculoskeletal: No chest wall mass or suspicious bone lesions identified. CT ABDOMEN PELVIS FINDINGS Hepatobiliary: Nodular liver contour. Gallbladder is unremarkable. No biliary ductal dilation. Pancreas: Mild peripancreatic fat stranding, likely due to volume status given normal serum lipase. Otherwise unremarkable. Spleen: Unremarkable. Adrenals/Urinary Tract: Bilateral adrenal glands are unremarkable. Cortical thinning of the bilateral kidneys. No evidence of hydronephrosis or nephrolithiasis. Distended urinary bladder. Stomach/Bowel: Stomach is within normal limits. Prior right hemi colectomy. No evidence of bowel wall thickening, distention, or inflammatory changes. Vascular/Lymphatic: Aortic atherosclerosis. No enlarged abdominal or pelvic lymph nodes. Reproductive: Prostate is unremarkable. Other: Small volume abdominal ascites. Diffuse soft tissue anasarca. Musculoskeletal: No acute or significant osseous findings. IMPRESSION: Mild bilateral ground-glass opacities which are most prominent in the upper lungs, favor pulmonary edema, although an infectious process could appear similar. Small bilateral pleural effusions, small volume abdominal ascites, and soft tissue anasarca, likely due to volume status. Nodular liver contour, suggestive of cirrhosis. Solid pulmonary nodule the right lower  lobe measuring 3 mm. No follow-up needed if patient is low-risk. Non-contrast chest CT can be considered in 12 months if patient is  high-risk. This recommendation follows the consensus statement: Guidelines for Management of Incidental Pulmonary Nodules Detected on CT Images: From the Fleischner Society 2017; Radiology 2017; 284:228-243. Aortic Atherosclerosis (ICD10-I70.0). Electronically Signed   By: Yetta Glassman M.D.   On: 10/14/2020 13:11   DG Chest Port 1 View  Result Date: 10/15/2020 CLINICAL DATA:  Pulmonary edema. EXAM: PORTABLE CHEST 1 VIEW COMPARISON:  October 14, 2020. FINDINGS: Stable cardiomegaly. Mild central pulmonary vascular congestion is noted. Bibasilar pulmonary edema or infiltrate is noted. Small bilateral pleural effusions are noted. Bony thorax is unremarkable. IMPRESSION: Stable cardiomegaly with mild central pulmonary vascular congestion. Bibasilar pulmonary edema or infiltrates are noted. Small bilateral pleural effusions are noted. Electronically Signed   By: Marijo Conception M.D.   On: 10/15/2020 08:10   [place] ASCITES (ABDOMEN LIMITED)  Result Date: 10/14/2020 CLINICAL DATA:  Abdominal distension question ascites. History CHF, cardiomyopathy, coronary artery disease, colon cancer, diabetes mellitus, hypertension EXAM: LIMITED ABDOMEN ULTRASOUND FOR ASCITES TECHNIQUE: Limited ultrasound survey for ascites was performed in all four abdominal quadrants. COMPARISON:  CT abdomen and pelvis 10/14/2020 FINDINGS: Small collection of ascites in LEFT lower quadrant, containing bowel loops. No other significant ascites seen. Volume of ascites is insufficient for paracentesis. IMPRESSION: Low volume ascites insufficient for paracentesis. Electronically Signed   By: Lavonia Dana M.D.   On: 10/14/2020 15:49   US LIVER DOPPLER  Result Date: 10/15/2020 CLINICAL DATA:  Cirrhosis. EXAM: DUPLEX ULTRASOUND OF LIVER TECHNIQUE: Color and duplex Doppler ultrasound was performed to evaluate the hepatic in-flow and out-flow vessels. COMPARISON:  10/14/2020 and CT 10/14/2020 FINDINGS: Liver: Nodular contour compatible with  cirrhosis. Perihepatic ascites. Main Portal Vein size: 1.3 cm Portal Vein Velocities Main Prox:  54 cm/sec Main Mid: 47 cm/sec Main Dist:  24 cm/sec Right: 42 cm/sec Left: 27 cm/sec Hepatic Vein Velocities Right:  64 cm/sec Middle:  59 cm/sec Left:  87 cm/sec IVC: Present and patent with normal respiratory phasicity. Hepatic Artery Velocity:  95 cm/sec Splenic Vein Velocity:  23 cm/sec Spleen: 17.1 cm x 18.7 cm x 7.3 cm with a total volume of 1229 cm^3 (411 cm^3 is upper limit normal) Portal Vein Occlusion/Thrombus: No Splenic Vein Occlusion/Thrombus: No Ascites: Present Varices: Limited evaluation Normal hepatopetal flow in the portal veins. Normal hepatofugal flow in the hepatic veins. IMPRESSION: 1. Cirrhosis with splenomegaly and perihepatic ascites. Findings are suggestive for portal hypertension. 2. Portal venous system is patent with normal direction of flow. Electronically Signed   By: Markus Daft M.D.   On: 10/15/2020 11:43    Scheduled Meds:  allopurinol  300 mg Oral Daily   atorvastatin  20 mg Oral QPM   ferrous sulfate  325 mg Oral Q breakfast   furosemide  40 mg Intravenous Q12H   insulin aspart  0-9 Units Subcutaneous TID WC   insulin glargine-yfgn  10 Units Subcutaneous Q2200   metoprolol succinate  75 mg Oral Daily   pantoprazole (PROTONIX) IV  40 mg Intravenous Q12H   potassium chloride SA  10 mEq Oral Daily   sildenafil  20 mg Oral TID   Continuous Infusions:  cefTRIAXone (ROCEPHIN)  IV 2 g (10/15/20 1726)     LOS: 2 days     Donavan Kerlin D Trashawn Oquendo, student How to contact the [facility] Attending or Consulting provider [place] or covering provider during after hours [place],  for this patient?  Check the care team in [facility] and look for a) attending/consulting [facility] provider listed and b) the [facility] team listed Log into [email or web address] and use [place]'s universal password to access. If you do not have the password, please contact the hospital operator. Locate the [facility] provider you are  looking for under [facility] and page to a number that you can be directly reached. If you still have difficulty reaching the provider, please page the [facility] (Director on Call) for the Hospitalists listed on amion for assistance.  10/16/2020, 12:23 PM

## 2020-10-16 NOTE — Progress Notes (Addendum)
2D echocardiogram performed with Definity. Definity given by Patient's nurse.  Wenda Low, RDCS

## 2020-10-17 ENCOUNTER — Inpatient Hospital Stay (HOSPITAL_COMMUNITY): Payer: Medicare Other | Admitting: Anesthesiology

## 2020-10-17 ENCOUNTER — Encounter (HOSPITAL_COMMUNITY)
Admission: EM | Disposition: A | Discharge status: Skilled Nursing Facility | Payer: Self-pay | Source: Home / Self Care | Attending: Family Medicine

## 2020-10-17 DIAGNOSIS — D509 Iron deficiency anemia, unspecified: Secondary | ICD-10-CM | POA: Diagnosis not present

## 2020-10-17 DIAGNOSIS — R188 Other ascites: Secondary | ICD-10-CM | POA: Diagnosis not present

## 2020-10-17 DIAGNOSIS — D649 Anemia, unspecified: Secondary | ICD-10-CM | POA: Diagnosis not present

## 2020-10-17 DIAGNOSIS — D123 Benign neoplasm of transverse colon: Secondary | ICD-10-CM

## 2020-10-17 DIAGNOSIS — K7460 Unspecified cirrhosis of liver: Secondary | ICD-10-CM | POA: Diagnosis not present

## 2020-10-17 DIAGNOSIS — D125 Benign neoplasm of sigmoid colon: Secondary | ICD-10-CM

## 2020-10-17 DIAGNOSIS — D124 Benign neoplasm of descending colon: Secondary | ICD-10-CM

## 2020-10-17 DIAGNOSIS — K621 Rectal polyp: Secondary | ICD-10-CM

## 2020-10-17 HISTORY — PX: POLYPECTOMY: SHX5525

## 2020-10-17 HISTORY — PX: COLONOSCOPY WITH PROPOFOL: SHX5780

## 2020-10-17 LAB — COMPREHENSIVE METABOLIC PANEL
ALT: 20 U/L (ref 0–44)
AST: 17 U/L (ref 15–41)
Albumin: 3.2 g/dL — ABNORMAL LOW (ref 3.5–5.0)
Alkaline Phosphatase: 58 U/L (ref 38–126)
Anion gap: 6 (ref 5–15)
BUN: 60 mg/dL — ABNORMAL HIGH (ref 8–23)
CO2: 26 mmol/L (ref 22–32)
Calcium: 8.2 mg/dL — ABNORMAL LOW (ref 8.9–10.3)
Chloride: 104 mmol/L (ref 98–111)
Creatinine, Ser: 2.32 mg/dL — ABNORMAL HIGH (ref 0.61–1.24)
GFR, Estimated: 30 mL/min — ABNORMAL LOW (ref >60)
Glucose, Bld: 97 mg/dL (ref 70–99)
Potassium: 3.9 mmol/L (ref 3.5–5.1)
Sodium: 136 mmol/L (ref 135–145)
Total Bilirubin: 0.9 mg/dL (ref 0.3–1.2)
Total Protein: 6.3 g/dL — ABNORMAL LOW (ref 6.5–8.1)

## 2020-10-17 LAB — CBC
HCT: 30.6 % — ABNORMAL LOW (ref 39.0–52.0)
HCT: 33.1 % — ABNORMAL LOW (ref 39.0–52.0)
Hemoglobin: 8.7 g/dL — ABNORMAL LOW (ref 13.0–17.0)
Hemoglobin: 9.4 g/dL — ABNORMAL LOW (ref 13.0–17.0)
MCH: 25.5 pg — ABNORMAL LOW (ref 26.0–34.0)
MCH: 26.1 pg (ref 26.0–34.0)
MCHC: 28.4 g/dL — ABNORMAL LOW (ref 30.0–36.0)
MCHC: 28.4 g/dL — ABNORMAL LOW (ref 30.0–36.0)
MCV: 89.7 fL (ref 80.0–100.0)
MCV: 91.9 fL (ref 80.0–100.0)
Platelets: 104 10*3/uL — ABNORMAL LOW (ref 150–400)
Platelets: 116 10*3/uL — ABNORMAL LOW (ref 150–400)
RBC: 3.41 MIL/uL — ABNORMAL LOW (ref 4.22–5.81)
RBC: 3.6 MIL/uL — ABNORMAL LOW (ref 4.22–5.81)
RDW: 17.4 % — ABNORMAL HIGH (ref 11.5–15.5)
RDW: 17.8 % — ABNORMAL HIGH (ref 11.5–15.5)
WBC: 4.7 10*3/uL (ref 4.0–10.5)
WBC: 5.4 10*3/uL (ref 4.0–10.5)
nRBC: 1.1 % — ABNORMAL HIGH (ref 0.0–0.2)
nRBC: 1.3 % — ABNORMAL HIGH (ref 0.0–0.2)

## 2020-10-17 LAB — BRAIN NATRIURETIC PEPTIDE: B Natriuretic Peptide: 290 pg/mL — ABNORMAL HIGH (ref 0.0–100.0)

## 2020-10-17 LAB — GLUCOSE, CAPILLARY
Glucose-Capillary: 103 mg/dL — ABNORMAL HIGH (ref 70–99)
Glucose-Capillary: 139 mg/dL — ABNORMAL HIGH (ref 70–99)
Glucose-Capillary: 162 mg/dL — ABNORMAL HIGH (ref 70–99)

## 2020-10-17 LAB — PROTIME-INR
INR: 1.4 — ABNORMAL HIGH (ref 0.8–1.2)
Prothrombin Time: 17.5 seconds — ABNORMAL HIGH (ref 11.4–15.2)

## 2020-10-17 SURGERY — COLONOSCOPY WITH PROPOFOL
Anesthesia: General

## 2020-10-17 MED ORDER — LIDOCAINE HCL (CARDIAC) PF 100 MG/5ML IV SOSY
PREFILLED_SYRINGE | INTRAVENOUS | Status: DC | PRN
  Administered 2020-10-17: 60 mg via INTRATRACHEAL

## 2020-10-17 MED ORDER — ETOMIDATE 2 MG/ML IV SOLN
INTRAVENOUS | Status: AC
  Filled 2020-10-17: qty 10

## 2020-10-17 MED ORDER — LACTATED RINGERS IV SOLN: INTRAVENOUS | Status: DC | PRN

## 2020-10-17 MED ORDER — LIDOCAINE HCL (PF) 2 % IJ SOLN
INTRAMUSCULAR | Status: AC
  Filled 2020-10-17: qty 5

## 2020-10-17 MED ORDER — PROPOFOL 10 MG/ML IV BOLUS
INTRAVENOUS | Status: DC | PRN
  Administered 2020-10-17: 20 mg via INTRAVENOUS
  Administered 2020-10-17: 10 mg via INTRAVENOUS
  Administered 2020-10-17 (×9): 20 mg via INTRAVENOUS
  Administered 2020-10-17: 50 mg via INTRAVENOUS
  Administered 2020-10-17 (×7): 20 mg via INTRAVENOUS

## 2020-10-17 MED ORDER — PHENYLEPHRINE 40 MCG/ML (10ML) SYRINGE FOR IV PUSH (FOR BLOOD PRESSURE SUPPORT)
PREFILLED_SYRINGE | INTRAVENOUS | Status: DC | PRN
  Administered 2020-10-17: 40 ug via INTRAVENOUS

## 2020-10-17 MED ORDER — PROPOFOL 10 MG/ML IV BOLUS
INTRAVENOUS | Status: AC
  Filled 2020-10-17: qty 40

## 2020-10-17 NOTE — Transfer of Care (Signed)
Immediate Anesthesia Transfer of Care Note  Patient: William Flores  Procedure(s) Performed: COLONOSCOPY WITH PROPOFOL POLYPECTOMY  Patient Location: PACU  Anesthesia Type:General  Level of Consciousness: drowsy  Airway & Oxygen Therapy: Patient Spontanous Breathing and Patient connected to face mask oxygen  Post-op Assessment: Post -op Vital signs reviewed and stable  Post vital signs: stable  Last Vitals:  Vitals Value Taken Time  BP 117/70 10/17/20 1115  Temp 36.6 C 10/17/20 1115  Pulse 102 10/17/20 1119  Resp 21 10/17/20 1119  SpO2 94 % 10/17/20 1119  Vitals shown include unvalidated device data.  Last Pain:  Vitals:   10/17/20 0900  TempSrc: Axillary  PainSc:          Complications: No notable events documented.

## 2020-10-17 NOTE — Anesthesia Postprocedure Evaluation (Addendum)
Anesthesia Post Note  Patient: KAIRE STARY  Procedure(s) Performed: COLONOSCOPY WITH PROPOFOL POLYPECTOMY  Patient location during evaluation: PACU Anesthesia Type: General Level of consciousness: awake and alert and sedated Pain management: pain level controlled Vital Signs Assessment: post-procedure vital signs reviewed and stable Respiratory status: spontaneous breathing, nonlabored ventilation, respiratory function stable and patient connected to face mask oxygen Cardiovascular status: blood pressure returned to baseline and stable Postop Assessment: no apparent nausea or vomiting Anesthetic complications: no   No notable events documented.   Last Vitals:  Vitals:   10/17/20 0900 10/17/20 1115  BP: 129/68 117/70  Pulse: 91 (!) 103  Resp: 19 (!) 23  Temp: (!) 36.1 C 36.6 C  SpO2: 94% 93%    Last Pain:  Vitals:   10/17/20 1115  TempSrc:   PainSc: Asleep                 Haisley Arens C Julian Medina

## 2020-10-17 NOTE — Op Note (Addendum)
[facility] Patient Name: William Flores Procedure Date: 10/17/2020 8:23 AM MRN: [id number] Date of Birth: 1950-06-14 Attending MD: Maylon Peppers ,  CSN: [id number] Age: 70 Admit Type: Inpatient Procedure:                Colonoscopy Indications:              Iron deficiency anemia Providers:                Maylon Peppers, Lurline Del, RN, Casimer Bilis, Technician Referring MD:              Medicines:                Monitored Anesthesia Care Complications:            No immediate complications. Estimated Blood Loss:     Estimated blood loss was minimal. Procedure:                Pre-Anesthesia Assessment:                           - Prior to the procedure, a History and Physical                            was performed, and patient medications, allergies                            and sensitivities were reviewed. The patient's                            tolerance of previous anesthesia was reviewed.                           After obtaining informed consent, the colonoscope                            was passed under direct vision. Throughout the                            procedure, the patient's blood pressure, pulse, and                            oxygen saturations were monitored continuously. The                            [id number]-HQ190L ([id number]) scope was introduced through                            the anus and advanced to the the terminal ileum.                            The colonoscopy was technically difficult and                            complex due to a redundant colon. The patient  tolerated the procedure well. Scope In: 9:09:21 AM Scope Out: 10:57:50 AM Scope Withdrawal Time: 0 hours 39 minutes 16 seconds  Total Procedure Duration: 1 hour 48 minutes 29 seconds  Findings:      The perianal and digital rectal examinations were normal.      There was evidence of a prior end-to-side ileo-colonic  anastomosis in       the ascending colon. This was patent and was characterized by healthy       appearing mucosa. The anastomosis was traversed.      Seven pedunculated and sessile polyps were found in the sigmoid colon,       descending colon, transverse colon and ascending colon. The polyps were       8 to 20 mm in size. These polyps were removed with a hot snare.       Resection and retrieval were complete. For hemostasis, three hemostatic       clips were successfully placed. There was no bleeding at the end of the       procedure. One of the clips fell off due to maneuvering.      Six sessile polyps were found in the rectum and sigmoid colon. The       polyps were 5 to 6 mm in size. These polyps were removed with a cold       snare. Resection and retrieval were complete. For hemostasis, two       hemostatic clips were successfully placed. There was no bleeding at the       end of the procedure.      Five sessile polyps were found in the sigmoid colon, transverse colon       and ascending colon. The polyps were 3 to 5 mm in size.      The retroflexed view of the distal rectum and anal verge was normal and       showed no anal or rectal abnormalities. Impression:               - Patent end-to-side ileo-colonic anastomosis,                            characterized by healthy appearing mucosa.                           - Seven 8 to 20 mm polyps in the sigmoid colon, in                            the descending colon, in the transverse colon and                            in the ascending colon, removed with a hot snare.                            Resected and retrieved. Clips were placed.                           - Six 5 to 6 mm polyps in the rectum and in the                            sigmoid colon, removed  with a cold snare. Resected                            and retrieved. Clips were placed.                           - Five 3 to 5 mm polyps in the sigmoid colon, in                             the transverse colon and in the ascending colon.                           - The distal rectum and anal verge are normal on                            retroflexion view. Moderate Sedation:      Per Anesthesia Care Recommendation:           - Return patient to hospital ward for ongoing care.                           - Clear liquid diet today.                           - Check H/H every day, repeat STAT if large amount                            of rectal bleeding or hemodynamic instability.                           - Restart coumadin in a week.                           - Await pathology results.                           - Repeat colonoscopy in 3 months for surveillance.                           As one of the polyps (sigmoid) had an inflammatory                            appearance and intermittent oozing, it was possibly                            the source of anemia. May consider Agile capsule                            followed by capsule endoscopy if worsening anemia                            (expected to have some Hb drop as he had several  polpys removed). Procedure Code(s):        --- Professional ---                           [phone], Colonoscopy, flexible; with removal of                            tumor(s), polyp(s), or other lesion(s) by snare                            technique Diagnosis Code(s):        --- Professional ---                           Z98.0, Intestinal bypass and anastomosis status                           K62.1, Rectal polyp                           K63.5, Polyp of colon                           D50.9, Iron deficiency anemia, unspecified CPT copyright 2019 [employer]. All rights reserved. The codes documented in this report are preliminary and upon coder review may  be revised to meet current compliance requirements. Maylon Peppers, MD Maylon Peppers,  10/17/2020 11:23:31 AM This report has  been signed electronically. Number of Addenda: 0

## 2020-10-17 NOTE — Brief Op Note (Signed)
10/14/2020 - 10/17/2020  11:24 AM  PATIENT:  William Flores  70 y.o. male  PRE-OPERATIVE DIAGNOSIS:  IDA, heme + stool  POST-OPERATIVE DIAGNOSIS:  large,polyp in sigmoid extracted per pieces; multiple polyps in rectum, sigmoid,transverse, descending; 1 ascending polyp(hs);  PROCEDURE:  Procedure(s) with comments: COLONOSCOPY WITH PROPOFOL (N/A) POLYPECTOMY - rectal, transverse, descending, ascending, sigmoid  SURGEON:  Surgeon(s) and Role:    * Harvel Quale, MD - Primary  Patient underwent colonoscopy under propofol sedation. Tolerated the procedure adequately. There was evidence of a prior end-to-side ileo-colonic anastomosis in the ascending colon.  This was patent and was characterized by healthy appearing mucosa.  The anastomosis was traversed. Seven pedunculated and sessile polyps were found in the sigmoid colon, descending colon, transverse colon and ascending colon.  The polyps were 8 to 20 mm in size.  These polyps were removed with a hot snare.  Resection and retrieval were complete.  For hemostasis, three hemostatic clips were successfully placed.  There was no bleeding at the end of the procedure. One of the clips fell off due to maneuvering. Six sessile polyps were found in the rectum and sigmoid colon.  The polyps were 5 to 6 mm in size.  These polyps were removed with a cold snare.  Resection and retrieval were complete.  For hemostasis, two hemostatic clips were successfully placed.  There was no bleeding at the end of the procedure.  Five sessile polyps were found in the sigmoid colon, transverse colon and ascending colon.  The polyps were 3 to 5 mm in size.  The retroflexed view of the distal rectum and anal verge was normal and showed no anal or rectal abnormalities.   RECOMMENDATIONS: - Return patient to hospital ward for ongoing care.  - Clear liquid diet today.  - Check H/H every day, repeat STAT if large amount of rectal bleeding or hemodynamic instability. -  Restart coumadin in a week. - Await pathology results.  - Repeat colonoscopy in 3 months for surveillance.   As one of the polyps (sigmoid) had an inflammatory appearance and intermittent oozing, it was possibly the source of anemia. May consider Agile capsule followed by capsule endoscopy if worsening anemia (expected to have some Hb drop as he had several polpys removed).  Maylon Peppers, MD Gastroenterology and Hepatology [facility]

## 2020-10-17 NOTE — Anesthesia Preprocedure Evaluation (Addendum)
Anesthesia Evaluation  Patient identified by MRN, date of birth, ID band Patient awake    Reviewed: Allergy & Precautions, NPO status , Patient's Chart, lab work & pertinent test results, reviewed documented beta blocker date and time   Airway Mallampati: II  TM Distance: >3 FB Neck ROM: Full    Dental  (+) Dental Advisory Given, Missing   Pulmonary shortness of breath, with exertion, lying and Long-Term Oxygen Therapy, sleep apnea, Continuous Positive Airway Pressure Ventilation and Oxygen sleep apnea , former smoker,           Cardiovascular Exercise Tolerance: Poor hypertension, Pt. on medications and Pt. on home beta blockers + CAD, +CHF and + DOE  + dysrhythmias Atrial Fibrillation  Rhythm:Irregular Rate:Normal  1. Left ventricular ejection fraction, by estimation, is 55 to 60%. The  left ventricle has normal function. The left ventricle has no regional  wall motion abnormalities. There is moderate concentric left ventricular  hypertrophy. Left ventricular diastolic function could not be evaluated. There is the interventricular septum is flattened in systole and diastole, consistent with right  ventricular pressure and volume overload.  2. Right ventricular systolic function is moderately reduced. The right  ventricular size is severely enlarged.  3. Left atrial size was moderately dilated.  4. Right atrial size was severely dilated.  5. The mitral valve is degenerative. Mild mitral valve regurgitation. No  evidence of mitral stenosis.  6. The aortic valve is tricuspid. There is moderate calcification of the  aortic valve. There is moderate thickening of the aortic valve. Aortic valve regurgitation is not visualized. Mild to moderate aortic valve sclerosis/calcification is present,  without any evidence of aortic stenosis.   14-Oct-2020 10:35:28 [place] [facility]-ER ROUTINE RECORD 1950-04-16 (76 yr) Male  Caucasian Vent. rate 89 BPM PR interval * ms QRS duration 104 ms QT/QTcB 391/476 ms P-R-T axes * 115 238 Right and left arm electrode reversal, interpretation assumes no reversal Atrial fibrillation Probable lateral infarct, age indeterminate Anteroseptal infarct, old No significant change since prior 6/22 Confirmed by Aletta Edouard [phone]) on 10/14/2020 10:38:23 AM   Neuro/Psych negative neurological ROS     GI/Hepatic GERD  Medicated and Controlled,(+) Cirrhosis       ,   Endo/Other  diabetes, Well Controlled, Type 2, Insulin DependentMorbid obesity  Renal/GU Renal Insufficiency and CRFRenal disease     Musculoskeletal   Abdominal   Peds  Hematology  (+) anemia ,   Anesthesia Other Findings Principal Problem:   GI bleed Active Problems:   Gout   Hypertensive heart disease   History of colon cancer   Morbid obesity ([place])   Right heart failure (HCC)   OSA (obstructive sleep apnea)   Nonischemic cardiomyopathy ([place])   Anticoagulated on Coumadin   Atrial fibrillation with slow ventricular response (HCC)   DM (diabetes mellitus), type 2 with renal complications (HCC)   Peripheral edema   Essential hypertension   Normocytic anemia   MGUS (monoclonal gammopathy of unknown significance)   CKD stage 3 due to type 2 diabetes mellitus ([place])   Hyperlipidemia associated with type 2 diabetes mellitus ([place])   Morbid obesity with BMI of 45.0-49.9, adult ([place])   Pulmonary hypertension ([place])   Acute on chronic congestive heart failure (HCC)   Bilateral leg edema   Fecal occult blood test positive   Acquired thrombophilia (HCC)   Cirrhosis (HCC)   Acute blood loss anemia    Reproductive/Obstetrics  Anesthesia Physical Anesthesia Plan  ASA: 4 and emergent  Anesthesia Plan: MAC and General   Post-op Pain Management:    Induction: Intravenous  PONV Risk Score and Plan: TIVA  Airway Management Planned:  Nasal Cannula, Natural Airway and Simple Face Mask  Additional Equipment:   Intra-op Plan:   Post-operative Plan: Possible Post-op intubation/ventilation  Informed Consent: I have reviewed the patients History and Physical, chart, labs and discussed the procedure including the risks, benefits and alternatives for the proposed anesthesia with the patient or authorized representative who has indicated his/her understanding and acceptance.     Dental advisory given  Plan Discussed with: CRNA and Surgeon  Anesthesia Plan Comments:       Anesthesia Quick Evaluation

## 2020-10-17 NOTE — Progress Notes (Signed)
We will proceed with colonoscopy as scheduled.  I thoroughly discussed with the patient his procedure, including the risks involved. Patient understands what the procedure involves including the benefits and any risks. Patient understands alternatives to the proposed procedure. Risks including (but not limited to) bleeding, tearing of the lining (perforation), rupture of adjacent organs, problems with heart and lung function, infection, and medication reactions. A small percentage of complications may require surgery, hospitalization, repeat endoscopic procedure, and/or transfusion.  Patient understood and agreed.  Maylon Peppers, MD Gastroenterology and Hepatology [facility]

## 2020-10-17 NOTE — Progress Notes (Signed)
PROGRESS NOTE   ORDEAN FOUTS  SAY:[id number] DOB: 1950/08/21 DOA: 10/14/2020 PCP: [name], [name], MD   Chief Complaint  Patient presents with   Gastroesophageal Reflux   Level of care: Telemetry  Brief Admission History:  Mr. Yancarlos Berthold is a medically complex gentleman who presents with multiple complaints including increasing generalized weakness and debility, increasing abdominal distention, uncontrolled acid reflux symptoms, shortness of breath, inability to lie recumbent, DOE, increasing edema in lower extremities, occasional black stools, and worsening LUQ abd pain.  He is followed by the heart failure team in [place].  He has pulmonary hypertension and is on sildenafil TID. He was found to be anemic, hemoglobin at 7.1 on admission 10/12,  5.8 on 10/13, requiring 2x PRBC, now 7.7 10/14. Warfarin was stopped, vitamin K given, IV lasix was given for his abdominal edema and small volume ascites. IV protonix bid has been continued. Pulmonary edema and pleural effusions noted 10/13 on CXR, lasix 44m bid continued. Gi plans for colonoscopy 10/15, bowel prep in process today 10/14.  Assessment & Plan:   Principal Problem:   GI bleed /   Fecal occult blood test positive -hemoccult positive, anemic, increased BUN, suspicious for bleeding gastric ulcer -anemic 5.8 normocytic, 2x PRBC ordered 10/13 -hemoglobin 7.7 after 2x prbc  -GI did colonoscopy 10/15 with findings of large rectal polyps with intermittent oozing from polyp, biopsies taken - GI recommends restarting warfarin in 1 week     Acute blood loss anemia -secondary to lower GI bleed, GI consulted, transfused, stable at this time - check CBC in AM     Normocytic anemia -10/12 hemoglobin of 7.1 -10/13, hemoglobin of 5.8 with two units of PRBC ordered 10/14 hgb 7.7, 10/15 9.4 -GI consulted for GI bleed, colonoscopy planned -continue po iron     Anticoagulated on Coumadin -ceased coumadin 10/12 -555mvitamin K given  10/12 -7.5 mg vitamin k  10/13 -Initial INR 3.6 on 10/12,  10/13 3.2, 10/14 1.8 - per GI can restart warfarin in 1 week  10/24/20   Acquired thrombophilia ([facility]-platelets 162 10/12 -platelets 123 10/14 -restart warfarin on 10/22 per GI     Morbid obesity with BMI of 45.0-49.9, adult ([facility]-outpatient lifestyle modifications -plausible obesity hypoventilation     Liver cirrhosis ([facility]-suggested on CT abdomen, Gi consulted -abdominal ultrasound shows cirrhosis with splenomegaly and perihepatic ascites. Findings are suggestive for portal hypertension. Portal venous system is patent with normal direction of flow.  -Low volume ascites insufficient for paracentesis - outpatient      Gout -no current complaints -continue allopurinol     Hypertensive heart disease -currently normotensive, significant RV dilation from pulmonary vascular hypertension -echocardiogram ordered -last echo 06/21/20 50/55% LVEF     History of colon cancer -previous colonoscopy with several polyps removed - frequent polyps seen on 10/15 - repeat colon survey recommended in 3 months      Right heart failure (HCC) -significant right ventricle dilation with evidence of pulmonary artery hypertension -bnp 10/12 340, 10/13 273, 10/14 341     OSA (obstructive sleep apnea) -uses CPAP at home     Nonischemic cardiomyopathy ([facility]-complete cardiac echo ordered - last echo 06/21/20 50/55% LVEF     Atrial fibrillation with slow ventricular response ([facility]-currently in Afib, rate in the 80's -was anticoagulated INR 3.6 -Stopped coumadin due to gi bleed     DM (diabetes mellitus), type 2 with renal complications (HCC) -A1A3Ff 8+ -SSI -carb modified diet  Essential hypertension -continue metoprolol -hold farxiga -hold torsemide -continue lasix IV 88m bid     MGUS (monoclonal gammopathy of unknown significance)     CKD stage 3 due to type 2 diabetes mellitus ([place] -Consider consulting  nephrology -baseline creatinine 2-2.2,  2.68 10/14/20, 10/13 2.7, 10/14 2.49 -does not appear dehydrated and questionable if this is a progression of his CKD -monitoring renal function     Hyperlipidemia associated with type 2 diabetes mellitus ([facility] -continue atorvastatin     Pulmonary hypertension -continue sildenafil    Peripheral edema /  Bilateral leg edema -Lasix 459mbid -currently no concern for DVT as he was very recently supraterapeutic INR -monitoring electrolytes and renal function    Acute on chronic congestive heart failure  -echocardiogram ordered -Intake and output tracking -trending BNP Echocardiogram:  IMPRESSIONS   1. Left ventricular ejection fraction, by estimation, is 55 to 60%. The left ventricle has normal function. The left ventricle has no regional wall motion abnormalities. There is moderate concentric left ventricular  hypertrophy. Left ventricular diastolic function could not be evaluated. There is the interventricular  septum is flattened in systole and diastole, consistent with right ventricular pressure and volume overload.   2. Right ventricular systolic function is moderately reduced. The right ventricular size is severely enlarged.   3. Left atrial size was moderately dilated.   4. Right atrial size was severely dilated.   5. The mitral valve is degenerative. Mild mitral valve regurgitation. No evidence of mitral stenosis.   6. The aortic valve is tricuspid. There is moderate calcification of the aortic valve. There is moderate thickening of the aortic valve. Aortic valve regurgitation is not visualized. Mild to moderate aortic valve  sclerosis/calcification is present, without any evidence of aortic stenosis.   DVT prophylaxis:  SCD Code Status: full Family Communication: none at bedside Disposition: stabilize for GI to determine source of GI bleed, then anticipate home  Status is: Inpatient  Remains inpatient appropriate because: blood loss  requiring transfusion and possibility for need to repeat    Consultants:  GI  Procedures:  Liver USKoreaoppler CXR UsKoreascites Colonoscopy planned 10/15  Antimicrobials:  -ceftriaxone 10/13- current, SBP prophylaxis per gi  Subjective: Pt was seen prior to colon survey, says he tolerated prep well   Objective: Vitals:   10/17/20 1130 10/17/20 1145 10/17/20 1200 10/17/20 1231  BP: 127/73 123/77 114/69 128/86  Pulse: 98 (!) 105 94 90  Resp: 20 (!) 23 20 20   Temp:    97.7 F (36.5 C)  TempSrc:    Oral  SpO2: 91% 95% 93% 94%  Weight:      Height:        Intake/Output Summary (Last 24 hours) at 10/17/2020 1857 Last data filed at 10/17/2020 1700 Gross per 24 hour  Intake 1112 ml  Output 50 ml  Net 1062 ml   Filed Weights   10/14/20 2057 10/16/20 0500 10/17/20 0531  Weight: 121.6 kg 124.7 kg 125.6 kg    Examination:  General exam: Appears tired, improved pallor, conversational   Respiratory system: Clear to auscultation. Respiratory effort normal, limited inspiration due to abdominal size   Cardiovascular system: normal S1 & S2 heard. No JVD, murmurs, rubs, gallops or clicks. Some pedal edema. Decreased pedal pulses bilaterally, less than 2 seconds capillary refill in his feel bilaterally   Gastrointestinal system: Abdomen is large, reduced lower abd edema compared to yesterday, mild LUQ tenderness, non tympanic, with present bowel sounds   Central nervous system:  Alert and oriented. No focal neurological deficits.   Extremities: moves all extremities appropriately, reports improved foot apin, reports worsened when his hemoglobin began to decline, decreased pedal pulses with normal cap refill in his feet   Skin: excoriations, no overt bruising or rashes   Psychiatry: Judgement and insight appear normal. Mood & affect appropriate.   Data Reviewed: I have personally reviewed following labs and imaging studies  CBC: Recent Labs  Lab 10/14/20 1102 10/15/20 0538  10/16/20 0529 10/17/20 0428 10/17/20 1135  WBC 6.0 4.2 4.4 4.7 5.4  NEUTROABS 4.4  --   --   --   --   HGB 7.1* 5.8* 7.7* 8.7* 9.4*  HCT 25.8* 21.9* 26.3* 30.6* 33.1*  MCV 89.3 89.8 88.9 89.7 91.9  PLT 162 129* 123* 104* 116*    Basic Metabolic Panel: Recent Labs  Lab 10/14/20 1102 10/15/20 0538 10/16/20 0529 10/17/20 0428  NA 137 136 137 136  K 4.1 4.2 3.8 3.9  CL 105 105 106 104  CO2 24 22 24 26   GLUCOSE 151* 122* 108* 97  BUN 78* 75* 68* 60*  CREATININE 2.68* 2.70* 2.49* 2.32*  CALCIUM 8.6* 8.5* 8.3* 8.2*  MG  --   --  2.2  --     GFR: Estimated Creatinine Clearance: 37 mL/min (A) (by C-G formula based on SCr of 2.32 mg/dL (H)).  Liver Function Tests: Recent Labs  Lab 10/14/20 1102 10/17/20 0428  AST 34 17  ALT 31 20  ALKPHOS 75 58  BILITOT 0.9 0.9  PROT 7.0 6.3*  ALBUMIN 3.6 3.2*    CBG: Recent Labs  Lab 10/16/20 1129 10/16/20 1626 10/16/20 2127 10/17/20 0724 10/17/20 1604  GLUCAP 109* 120* 116* 103* 139*    Recent Results (from the past 240 hour(s))  Resp Panel by RT-PCR (Flu A&B, Covid) Nasopharyngeal Swab     Status: None   Collection Time: 10/14/20  2:15 PM   Specimen: Nasopharyngeal Swab; Nasopharyngeal(NP) swabs in vial transport medium  Result Value Ref Range Status   SARS Coronavirus 2 by RT PCR NEGATIVE NEGATIVE Final    Comment: (NOTE) SARS-CoV-2 target nucleic acids are NOT DETECTED.  The SARS-CoV-2 RNA is generally detectable in upper respiratory specimens during the acute phase of infection. The lowest concentration of SARS-CoV-2 viral copies this assay can detect is 138 copies/mL. A negative result does not preclude SARS-Cov-2 infection and should not be used as the sole basis for treatment or other patient management decisions. A negative result may occur with  improper specimen collection/handling, submission of specimen other than nasopharyngeal swab, presence of viral mutation(s) within the areas targeted by this assay,  and inadequate number of viral copies(<138 copies/mL). A negative result must be combined with clinical observations, patient history, and epidemiological information. The expected result is Negative.  Fact Sheet for Patients:  EntrepreneurPulse.com.au  Fact Sheet for Healthcare Providers:  IncredibleEmployment.be  This test is no t yet approved or cleared by the [place] FDA and  has been authorized for detection and/or diagnosis of SARS-CoV-2 by FDA under an Emergency Use Authorization (EUA). This EUA will remain  in effect (meaning this test can be used) for the duration of the COVID-19 declaration under Section 564(b)(1) of the Act, 21 U.S.C.section 360bbb-3(b)(1), unless the authorization is terminated  or revoked sooner.       Influenza A by PCR NEGATIVE NEGATIVE Final   Influenza B by PCR NEGATIVE NEGATIVE Final    Comment: (NOTE) The Xpert Xpress SARS-CoV-2/FLU/RSV plus assay  is intended as an aid in the diagnosis of influenza from Nasopharyngeal swab specimens and should not be used as a sole basis for treatment. Nasal washings and aspirates are unacceptable for Xpert Xpress SARS-CoV-2/FLU/RSV testing.  Fact Sheet for Patients: EntrepreneurPulse.com.au  Fact Sheet for Healthcare Providers: IncredibleEmployment.be  This test is not yet approved or cleared by the [place] FDA and has been authorized for detection and/or diagnosis of SARS-CoV-2 by FDA under an Emergency Use Authorization (EUA). This EUA will remain in effect (meaning this test can be used) for the duration of the COVID-19 declaration under Section 564(b)(1) of the Act, 21 U.S.C. section 360bbb-3(b)(1), unless the authorization is terminated or revoked.  Performed at [facility], [address] [place],  [address]      Radiology Studies: ECHOCARDIOGRAM COMPLETE  Result Date: 10/16/2020    ECHOCARDIOGRAM  REPORT   Patient Name:   JOHNE BUCKLE Date of Exam: 10/16/2020 Medical Rec #:  [id number]      Height:       64.0 in Accession #:    [id number]     Weight:       274.9 lb Date of Birth:  June 24, 1950      BSA:          2.240 m Patient Age:    70 years       BP:           115/74 mmHg Patient Gender: M              HR:           79 bpm. Exam Location:  [facility] Procedure: 2D Echo, Cardiac Doppler and Color Doppler Indications:    CHF  History:        Patient has prior history of Echocardiogram examinations, most                 recent 06/21/2020. CHF, Arrythmias:Atrial Fibrillation,                 Signs/Symptoms:Syncope; Risk Factors:Hypertension, Diabetes and                 Dyslipidemia. Right Heart Failure, Nonischemic cardiomyopathy,                 Morbid obesity.  Sonographer:    Wenda Low Referring Phys: [place]  1. Left ventricular ejection fraction, by estimation, is 55 to 60%. The left ventricle has normal function. The left ventricle has no regional wall motion abnormalities. There is moderate concentric left ventricular hypertrophy. Left ventricular diastolic function could not be evaluated. There is the interventricular septum is flattened in systole and diastole, consistent with right ventricular pressure and volume overload.  2. Right ventricular systolic function is moderately reduced. The right ventricular size is severely enlarged.  3. Left atrial size was moderately dilated.  4. Right atrial size was severely dilated.  5. The mitral valve is degenerative. Mild mitral valve regurgitation. No evidence of mitral stenosis.  6. The aortic valve is tricuspid. There is moderate calcification of the aortic valve. There is moderate thickening of the aortic valve. Aortic valve regurgitation is not visualized. Mild to moderate aortic valve sclerosis/calcification is present, without any evidence of aortic stenosis. Comparison(s): No significant change from prior study.  Conclusion(s)/Recommendation(s): Findings consistent with Cor Pulmonale. FINDINGS  Left Ventricle: Left ventricular ejection fraction, by estimation, is 55 to 60%. The left ventricle has normal function. The left ventricle has no regional wall  motion abnormalities. Definity contrast agent was given IV to delineate the left ventricular  endocardial borders. The left ventricular internal cavity size was normal in size. There is moderate concentric left ventricular hypertrophy. The interventricular septum is flattened in systole and diastole, consistent with right ventricular pressure and volume overload. Left ventricular diastolic function could not be evaluated due to atrial fibrillation. Left ventricular diastolic function could not be evaluated. Right Ventricle: The right ventricular size is severely enlarged. No increase in right ventricular wall thickness. Right ventricular systolic function is moderately reduced. Left Atrium: Left atrial size was moderately dilated. Right Atrium: Right atrial size was severely dilated. Pericardium: Trivial pericardial effusion is present. Mitral Valve: The mitral valve is degenerative in appearance. Mild to moderate mitral annular calcification. Mild mitral valve regurgitation. No evidence of mitral valve stenosis. MV peak gradient, 10.0 mmHg. The mean mitral valve gradient is 2.0 mmHg. Tricuspid Valve: The tricuspid valve is grossly normal. Tricuspid valve regurgitation is mild . No evidence of tricuspid stenosis. Aortic Valve: The aortic valve is tricuspid. There is moderate calcification of the aortic valve. There is moderate thickening of the aortic valve. Aortic valve regurgitation is not visualized. Mild to moderate aortic valve sclerosis/calcification is present, without any evidence of aortic stenosis. Aortic valve mean gradient measures 4.0 mmHg. Aortic valve peak gradient measures 9.0 mmHg. Aortic valve area, by VTI measures 2.01 cm. Pulmonic Valve: The pulmonic valve  was grossly normal. Pulmonic valve regurgitation is not visualized. No evidence of pulmonic stenosis. Aorta: The aortic root and ascending aorta are structurally normal, with no evidence of dilitation. Venous: The inferior vena cava was not well visualized. IAS/Shunts: The atrial septum is grossly normal.  LEFT VENTRICLE PLAX 2D LVIDd:         5.50 cm   Diastology LVIDs:         3.50 cm   LV e' medial:    6.31 cm/s LV PW:         1.30 cm   LV E/e' medial:  21.6 LV IVS:        1.50 cm   LV e' lateral:   9.90 cm/s LVOT diam:     2.00 cm   LV E/e' lateral: 13.7 LV SV:         58 LV SV Index:   26 LVOT Area:     3.14 cm  RIGHT VENTRICLE RV Basal diam:  4.70 cm RV Mid diam:    3.90 cm RV S prime:     10.10 cm/s TAPSE (M-mode): 1.3 cm LEFT ATRIUM              Index        RIGHT ATRIUM           Index LA diam:        5.70 cm  2.55 cm/m   RA Area:     30.50 cm LA Vol (A2C):   137.0 ml 61.17 ml/m  RA Volume:   121.00 ml 54.03 ml/m LA Vol (A4C):   95.4 ml  42.60 ml/m LA Biplane Vol: 120.0 ml 53.58 ml/m  AORTIC VALVE                    PULMONIC VALVE AV Area (Vmax):    1.87 cm     PV Vmax:       0.57 m/s AV Area (Vmean):   2.03 cm     PV Peak grad:  1.3 mmHg AV Area (VTI):  2.01 cm AV Vmax:           150.00 cm/s AV Vmean:          96.900 cm/s AV VTI:            0.289 m AV Peak Grad:      9.0 mmHg AV Mean Grad:      4.0 mmHg LVOT Vmax:         89.20 cm/s LVOT Vmean:        62.600 cm/s LVOT VTI:          0.185 m LVOT/AV VTI ratio: 0.64  AORTA Ao Root diam: 3.20 cm Ao Asc diam:  3.20 cm MITRAL VALVE                TRICUSPID VALVE MV Area (PHT): 3.61 cm     TR Peak grad:   74.3 mmHg MV Area VTI:   1.74 cm     TR Vmax:        431.00 cm/s MV Peak grad:  10.0 mmHg MV Mean grad:  2.0 mmHg     SHUNTS MV Vmax:       1.58 m/s     Systemic VTI:  0.18 m MV Vmean:      46.3 cm/s    Systemic Diam: 2.00 cm MV Decel Time: 210 msec MV E velocity: 136.00 cm/s MV A velocity: 42.70 cm/s MV E/A ratio:  3.19 Eleonore Chiquito MD  Electronically signed by Eleonore Chiquito MD Signature Date/Time: 10/16/2020/4:13:15 PM    Final     Scheduled Meds:  allopurinol  300 mg Oral Daily   atorvastatin  20 mg Oral QPM   ferrous sulfate  325 mg Oral Q breakfast   furosemide  40 mg Intravenous Q12H   insulin aspart  0-9 Units Subcutaneous TID WC   insulin glargine-yfgn  10 Units Subcutaneous Q2200   metoprolol succinate  75 mg Oral Daily   pantoprazole (PROTONIX) IV  40 mg Intravenous Q12H   potassium chloride SA  10 mEq Oral Daily   sildenafil  20 mg Oral TID   Continuous Infusions:  cefTRIAXone (ROCEPHIN)  IV 2 g (10/17/20 1444)     LOS: 3 days   Irwin Brakeman, MD  How to contact the [facility] Attending or Consulting provider [place] or covering provider during after hours University, for this patient?  Check the care team in [facility] and look for a) attending/consulting [facility] provider listed and b) the [facility] team listed Log into [email or web address] and use [place]'s universal password to access. If you do not have the password, please contact the hospital operator. Locate the [facility] provider you are looking for under [facility] and page to a number that you can be directly reached. If you still have difficulty reaching the provider, please page the [facility] (Director on Call) for the Hospitalists listed on amion for assistance.  10/17/2020, 6:57 PM

## 2020-10-18 DIAGNOSIS — K7460 Unspecified cirrhosis of liver: Secondary | ICD-10-CM | POA: Diagnosis not present

## 2020-10-18 DIAGNOSIS — D649 Anemia, unspecified: Secondary | ICD-10-CM | POA: Diagnosis not present

## 2020-10-18 DIAGNOSIS — R188 Other ascites: Secondary | ICD-10-CM | POA: Diagnosis not present

## 2020-10-18 LAB — BPAM RBC
Blood Product Expiration Date: 202211142359
Blood Product Expiration Date: 202211142359
Blood Product Expiration Date: 202211182359
ISSUE DATE / TIME: 202210130858
ISSUE DATE / TIME: 202210131414
ISSUE DATE / TIME: 202210141454
Unit Type and Rh: 5100
Unit Type and Rh: 5100
Unit Type and Rh: 5100

## 2020-10-18 LAB — GLUCOSE, CAPILLARY
Glucose-Capillary: 90 mg/dL (ref 70–99)
Glucose-Capillary: 92 mg/dL (ref 70–99)
Glucose-Capillary: 124 mg/dL — ABNORMAL HIGH (ref 70–99)
Glucose-Capillary: 147 mg/dL — ABNORMAL HIGH (ref 70–99)

## 2020-10-18 LAB — TYPE AND SCREEN
ABO/RH(D): O POS
Antibody Screen: NEGATIVE
Unit division: 0
Unit division: 0
Unit division: 0

## 2020-10-18 LAB — CBC
HCT: 29.4 % — ABNORMAL LOW (ref 39.0–52.0)
Hemoglobin: 8.2 g/dL — ABNORMAL LOW (ref 13.0–17.0)
MCH: 25.2 pg — ABNORMAL LOW (ref 26.0–34.0)
MCHC: 27.9 g/dL — ABNORMAL LOW (ref 30.0–36.0)
MCV: 90.5 fL (ref 80.0–100.0)
Platelets: 107 10*3/uL — ABNORMAL LOW (ref 150–400)
RBC: 3.25 MIL/uL — ABNORMAL LOW (ref 4.22–5.81)
RDW: 17.4 % — ABNORMAL HIGH (ref 11.5–15.5)
WBC: 4.5 10*3/uL (ref 4.0–10.5)
nRBC: 0.9 % — ABNORMAL HIGH (ref 0.0–0.2)

## 2020-10-18 LAB — COMPREHENSIVE METABOLIC PANEL
ALT: 16 U/L (ref 0–44)
AST: 17 U/L (ref 15–41)
Albumin: 3.1 g/dL — ABNORMAL LOW (ref 3.5–5.0)
Alkaline Phosphatase: 60 U/L (ref 38–126)
Anion gap: 6 (ref 5–15)
BUN: 52 mg/dL — ABNORMAL HIGH (ref 8–23)
CO2: 25 mmol/L (ref 22–32)
Calcium: 8.4 mg/dL — ABNORMAL LOW (ref 8.9–10.3)
Chloride: 103 mmol/L (ref 98–111)
Creatinine, Ser: 2.2 mg/dL — ABNORMAL HIGH (ref 0.61–1.24)
GFR, Estimated: 32 mL/min — ABNORMAL LOW (ref >60)
Glucose, Bld: 99 mg/dL (ref 70–99)
Potassium: 3.9 mmol/L (ref 3.5–5.1)
Sodium: 134 mmol/L — ABNORMAL LOW (ref 135–145)
Total Bilirubin: 0.8 mg/dL (ref 0.3–1.2)
Total Protein: 6 g/dL — ABNORMAL LOW (ref 6.5–8.1)

## 2020-10-18 MED ORDER — SPIRONOLACTONE 25 MG PO TABS
50.0000 mg | ORAL_TABLET | Freq: Every day | ORAL | Status: DC
  Administered 2020-10-18 – 2020-10-22 (×4): 50 mg via ORAL
  Filled 2020-10-18 (×5): qty 2

## 2020-10-18 MED ORDER — FUROSEMIDE 10 MG/ML IJ SOLN
40.0000 mg | Freq: Every day | INTRAMUSCULAR | Status: DC
  Administered 2020-10-19: 40 mg via INTRAVENOUS
  Filled 2020-10-18: qty 4

## 2020-10-18 NOTE — Progress Notes (Signed)
William Flores, M.D. Gastroenterology & Hepatology   Interval History: No acute events overnight. Patient reports that he had normal bowel movement x1 yesterday.  Denies having any nausea, vomiting, fever, chills.  States that he no longer feels the abdominal distention or pain. Underwent colonoscopy yesterday, was found to have multiple large size polyps which tended to bleed after resection.  A total of 13 polyps were removed with largest size of 20 mm, 5 clips were placed.  There were a few small polyps that were not removed.  There was no evidence of any ongoing bleeding although one of the polyps had scant oozing and had inflammatory appearance.  Inpatient Medications:  Current Facility-Administered Medications:    acetaminophen (TYLENOL) tablet 650 mg, 650 mg, Oral, Q6H PRN, 650 mg at 10/16/20 0129 **OR** acetaminophen (TYLENOL) suppository 650 mg, 650 mg, Rectal, Q6H PRN, Johnson, Clanford L, MD   albuterol (PROVENTIL) (2.5 MG/3ML) 0.083% nebulizer solution 2.5 mg, 2.5 mg, Nebulization, Q2H PRN, Johnson, Clanford L, MD   allopurinol (ZYLOPRIM) tablet 300 mg, 300 mg, Oral, Daily, Johnson, Clanford L, MD, 300 mg at 10/16/20 0908   alum & mag hydroxide-simeth (MAALOX/MYLANTA) 200-200-20 MG/5ML suspension 30 mL, 30 mL, Oral, Q4H PRN, Johnson, Clanford L, MD, 30 mL at 10/16/20 1525   atorvastatin (LIPITOR) tablet 20 mg, 20 mg, Oral, QPM, Johnson, Clanford L, MD, 20 mg at 10/17/20 1657   benzonatate (TESSALON) capsule 100 mg, 100 mg, Oral, TID PRN, Wynetta Emery, Clanford L, MD, 100 mg at 10/17/20 2123   bisacodyl (DULCOLAX) EC tablet 5 mg, 5 mg, Oral, Daily PRN, Johnson, Clanford L, MD   cefTRIAXone (ROCEPHIN) 2 g in sodium chloride 0.9 % 100 mL IVPB, 2 g, Intravenous, Q24H, Mahala Menghini, PA-C, Stopped at 10/17/20 1514   diphenhydrAMINE (BENADRYL) capsule 25 mg, 25 mg, Oral, Q6H PRN, Johnson, Clanford L, MD   fentaNYL (SUBLIMAZE) injection 12.5-50 mcg, 12.5-50 mcg, Intravenous, Q2H PRN, Johnson,  Clanford L, MD   ferrous sulfate tablet 325 mg, 325 mg, Oral, Q breakfast, Johnson, Clanford L, MD, 325 mg at 10/16/20 0908   furosemide (LASIX) injection 40 mg, 40 mg, Intravenous, Q12H, Johnson, Clanford L, MD, 40 mg at 10/18/20 0541   insulin aspart (novoLOG) injection 0-9 Units, 0-9 Units, Subcutaneous, TID WC, Johnson, Clanford L, MD, 1 Units at 10/17/20 1657   insulin glargine-yfgn (SEMGLEE) injection 10 Units, 10 Units, Subcutaneous, Q2200, Johnson, Clanford L, MD, 10 Units at 10/17/20 2123   metoprolol succinate (TOPROL-XL) 24 hr tablet 75 mg, 75 mg, Oral, Daily, Johnson, Clanford L, MD, 75 mg at 10/16/20 0907   ondansetron (ZOFRAN) tablet 4 mg, 4 mg, Oral, Q6H PRN **OR** ondansetron (ZOFRAN) injection 4 mg, 4 mg, Intravenous, Q6H PRN, Johnson, Clanford L, MD   pantoprazole (PROTONIX) injection 40 mg, 40 mg, Intravenous, Q12H, Johnson, Clanford L, MD, 40 mg at 10/17/20 2123   phenol (CHLORASEPTIC) mouth spray 1 spray, 1 spray, Mouth/Throat, PRN, Johnson, Clanford L, MD, 1 spray at 10/16/20 0908   potassium chloride (KLOR-CON) CR tablet 10 mEq, 10 mEq, Oral, Daily, Johnson, Clanford L, MD, 10 mEq at 10/16/20 0908   sildenafil (REVATIO) tablet 20 mg, 20 mg, Oral, TID, Johnson, Clanford L, MD, 20 mg at 10/17/20 2123   traZODone (DESYREL) tablet 25 mg, 25 mg, Oral, QHS PRN, Wynetta Emery, Clanford L, MD, 25 mg at 10/16/20 2043   I/O    Intake/Output Summary (Last 24 hours) at 10/18/2020 0841 Last data filed at 10/18/2020 0824 Gross per 24 hour  Intake 1393 ml  Output 50 ml  Net 1343 ml     Physical Exam: Temp:  [97 F (36.1 C)-99.2 F (37.3 C)] 98.5 F (36.9 C) (10/16 0504) Pulse Rate:  [89-105] 95 (10/16 0504) Resp:  [18-23] 20 (10/16 0504) BP: (103-129)/(58-86) 103/58 (10/16 0504) SpO2:  [91 %-95 %] 93 % (10/16 0504) Weight:  [123.9 kg] 123.9 kg (10/16 0504)  Temp (24hrs), Avg:98.3 F (36.8 C), Min:97 F (36.1 C), Max:99.2 F (37.3 C) GENERAL: The patient is AO x3, in no acute  distress. HEENT: Head is normocephalic and atraumatic. EOMI are intact. Mouth is well hydrated and without lesions. NECK: Supple. No masses LUNGS: Clear to auscultation. No presence of rhonchi/wheezing/rales. Adequate chest expansion HEART: RRR, normal s1 and s2. ABDOMEN: Soft, nontender, no guarding, no peritoneal signs. Mildly distended but not tense. BS +. No masses. EXTREMITIES: Without any cyanosis, clubbing, rash, lesions or edema. NEUROLOGIC: AOx3, no focal motor deficit. SKIN: no jaundice, no rashes  Laboratory Data: CBC:     Component Value Date/Time   WBC 4.5 10/18/2020 0532   RBC 3.25 (L) 10/18/2020 0532   HGB 8.2 (L) 10/18/2020 0532   HGB 9.4 (L) 08/26/2020 1517   HCT 29.4 (L) 10/18/2020 0532   HCT 31.7 (L) 08/26/2020 1517   PLT 107 (L) 10/18/2020 0532   PLT 142 (L) 08/26/2020 1517   MCV 90.5 10/18/2020 0532   MCV 91 08/26/2020 1517   MCH 25.2 (L) 10/18/2020 0532   MCHC 27.9 (L) 10/18/2020 0532   RDW 17.4 (H) 10/18/2020 0532   RDW 17.5 (H) 08/26/2020 1517   LYMPHSABS 0.7 10/14/2020 1102   LYMPHSABS 0.7 08/26/2020 1517   MONOABS 0.7 10/14/2020 1102   EOSABS 0.1 10/14/2020 1102   EOSABS 0.2 08/26/2020 1517   BASOSABS 0.1 10/14/2020 1102   BASOSABS 0.1 08/26/2020 1517   COAG:  Lab Results  Component Value Date   INR 1.4 (H) 10/17/2020   INR 1.8 (H) 10/16/2020   INR 3.2 (H) 10/15/2020    BMP:  BMP Latest Ref Rng & Units 10/18/2020 10/17/2020 10/16/2020  Glucose 70 - 99 mg/dL 99 97 108(H)  BUN 8 - 23 mg/dL 52(H) 60(H) 68(H)  Creatinine 0.61 - 1.24 mg/dL 2.20(H) 2.32(H) 2.49(H)  BUN/Creat Ratio 10 - 24 - - -  Sodium 135 - 145 mmol/L 134(L) 136 137  Potassium 3.5 - 5.1 mmol/L 3.9 3.9 3.8  Chloride 98 - 111 mmol/L 103 104 106  CO2 22 - 32 mmol/L 25 26 24   Calcium 8.9 - 10.3 mg/dL 8.4(L) 8.2(L) 8.3(L)    HEPATIC:  Hepatic Function Latest Ref Rng & Units 10/18/2020 10/17/2020 10/14/2020  Total Protein 6.5 - 8.1 g/dL 6.0(L) 6.3(L) 7.0  Albumin 3.5 - 5.0 g/dL  3.1(L) 3.2(L) 3.6  AST 15 - 41 U/L 17 17 34  ALT 0 - 44 U/L 16 20 31   Alk Phosphatase 38 - 126 U/L 60 58 75  Total Bilirubin 0.3 - 1.2 mg/dL 0.8 0.9 0.9  Bilirubin, Direct 0.0 - 0.3 mg/dL - - -    CARDIAC:  Lab Results  Component Value Date   TROPONINI 0.22 (H) 04/14/2014      Imaging: I personally reviewed and interpreted the available labs, imaging and endoscopic files.   Assessment/Plan:  Briefly, this is a 69 year old male with history of atrial fibrillation on warfarin, cardiomyopathy, pulmonary hypertension on chronic oxygen use, and iron deficiency anemia, who was admitted to the hospital after presenting worsening nausea and abdominal distention.  Work-up during his current admission showed  presence of cirrhotic control of his liver along with presence of mild ascites.  He has improved with the use of IV Lasix to diurese him and he does not present any more distention and has been able to tolerate diet. Etiology likely due to NASH, pending autoimmune markers.   However, he was found to have worsening anemia down to 5.8.  Also found to have low iron stores.  Underwent previous foot enteroscopy due to IDA recently without a source of chronic losses.  Colonoscopy was performed yesterday which showed presence of multiple large size polyps which tended to bleed after resection.  A total of 13 polyps were removed with largest size of 20 mm, 5 clips were placed.  There were a few small polyps that were not removed.  There was no evidence of any ongoing bleeding although one of the polyps had scant oozing and had inflammatory appearance.  At this point, given that he has remained hemodynamically stable, we will advance his diet.  It is possible that he has presented some chronic losses due to the inflammatory appearance of some of the polyps.  Pathology is pending.  If he were to have worsening anemia, we will need to proceed with a capsule endoscopy.  The patient understood and agreed.  Will  consider discharging him on oral iron supplementation.  - Advance to soft diet - Check H/H every day, repeat STAT if large amount of rectal bleeding or hemodynamic instability. - Restart coumadin a week from colonoscopy. - Await pathology results.  - Continue IV Lasix 40 mg, can decrease to once a day - Start spironolactone 50 mg qday - Follow up autoimmune markers - Once discharged, will need to go on oral iron supplementation - Repeat colonoscopy in 3 months for polypectomy.   William Peppers, MD Gastroenterology and Hepatology [facility]

## 2020-10-18 NOTE — Progress Notes (Signed)
PROGRESS NOTE   William Flores  EOF:[id number] DOB: 1950-02-13 DOA: 10/14/2020 PCP: [name], [name], MD   Chief Complaint  Patient presents with   Gastroesophageal Reflux   Level of care: Telemetry  Brief Admission History:  [name] is a medically complex gentleman who presents with multiple complaints including increasing generalized weakness and debility, increasing abdominal distention, uncontrolled acid reflux symptoms, shortness of breath, inability to lie recumbent, DOE, increasing edema in lower extremities, occasional black stools, and worsening LUQ abd pain.  He is followed by the heart failure team in [place].  He has pulmonary hypertension and is on sildenafil TID. He was found to be anemic, hemoglobin at 7.1 on admission 10/12,  5.8 on 10/13, requiring 2x PRBC, now 7.7 10/14. Warfarin was stopped, vitamin K given, IV lasix was given for his abdominal edema and small volume ascites. IV protonix bid has been continued. Pulmonary edema and pleural effusions noted 10/13 on CXR, lasix 74m bid continued. Gi plans for colonoscopy 10/15, bowel prep in process today 10/14.  Assessment & Plan:   GI bleed /   Fecal occult blood test positive -hemoccult positive, anemic, increased BUN, suspicious for bleeding gastric ulcer -anemic 5.8 normocytic, 2x PRBC ordered 10/13 -hemoglobin 7.7 after 2x prbc  -GI did colonoscopy 10/15 with findings of large rectal polyps with intermittent oozing from polyp, biopsies taken - GI recommends restarting warfarin in 1 week (10/24/20)     Acute blood loss anemia -secondary to lower GI bleed, GI consulted, transfused, stable at this time - Hg down to 8.2 today. - recheck CBC in AM.     Normocytic anemia -10/12 hemoglobin of 7.1 -10/13, hemoglobin of 5.8 with two units of PRBC ordered 10/14 hgb 7.7, 10/15 9.4 -GI consulted for GI bleed, colonoscopy planned -continue oral iron supplementation      Anticoagulated on Coumadin -ceased  coumadin 10/12 -592mvitamin K given 10/12 -7.5 mg vitamin k  10/13 -Initial INR 3.6 on 10/12,  10/13 3.2, 10/14 1.8 - per GI can restart warfarin in 1 week  10/24/20   Acquired thrombophilia ([facility]-platelets 162 10/12 -platelets 123 10/14 -restart warfarin on 10/22 per GI     Morbid obesity with BMI of 45.0-49.9, adult ([facility]-outpatient lifestyle modifications -plausible obesity hypoventilation     Liver cirrhosis ([facility]-suggested on CT abdomen, Gi consulted -abdominal ultrasound shows cirrhosis with splenomegaly and perihepatic ascites. Findings are suggestive for portal hypertension. Portal venous system is patent with normal direction of flow.  -Low volume ascites insufficient for paracentesis - outpatient      Gout -no current complaints -continue allopurinol     Hypertensive heart disease -currently normotensive, significant RV dilation from pulmonary vascular hypertension -echocardiogram ordered -last echo 06/21/20 50/55% LVEF     History of colon cancer -previous colonoscopy with several polyps removed - frequent polyps seen on 10/15 - repeat colon survey recommended in 3 months      Right heart failure (HCC) -significant right ventricle dilation with evidence of pulmonary artery hypertension -bnp 10/12 340, 10/13 273, 10/14 341     OSA (obstructive sleep apnea) -uses CPAP at home     Nonischemic cardiomyopathy ([facility]-complete cardiac echo ordered - last echo 06/21/20 50/55% LVEF     Atrial fibrillation with slow ventricular response ([facility]-currently in Afib, rate in the 80's -was anticoagulated INR 3.6 -Stopped coumadin due to gi bleed     DM (diabetes mellitus), type 2 with renal complications (HCC) -A1G5Qf 8+ -SSI -carb  modified diet  CBG (last 3)  Recent Labs    10/17/20 2055 10/18/20 0719 10/18/20 1120  GLUCAP 162* 90 92      Essential hypertension -continue metoprolol -hold farxiga -hold torsemide -continue lasix IV 81m bid     MGUS  (monoclonal gammopathy of unknown significance)     CKD stage 3 due to type 2 diabetes mellitus ([facility] [place] -Consider consulting nephrology -baseline creatinine 2-2.2,  2.68 10/14/20, 10/13 2.7, 10/14 2.49 -does not appear dehydrated and questionable if this is a progression of his CKD -monitoring renal function     Hyperlipidemia associated with type 2 diabetes mellitus (HCC) -continue atorvastatin     Pulmonary hypertension -continue sildenafil    Peripheral edema /  Bilateral leg edema -Lasix 437mbid -currently no concern for DVT as he was very recently supraterapeutic INR -monitoring electrolytes and renal function    Acute on chronic congestive heart failure  -echocardiogram ordered -Intake and output tracking -trending BNP Echocardiogram:  IMPRESSIONS   1. Left ventricular ejection fraction, by estimation, is 55 to 60%. The left ventricle has normal function. The left ventricle has no regional wall motion abnormalities. There is moderate concentric left ventricular  hypertrophy. Left ventricular diastolic function could not be evaluated. There is the interventricular  septum is flattened in systole and diastole, consistent with right ventricular pressure and volume overload.   2. Right ventricular systolic function is moderately reduced. The right ventricular size is severely enlarged.   3. Left atrial size was moderately dilated.   4. Right atrial size was severely dilated.   5. The mitral valve is degenerative. Mild mitral valve regurgitation. No evidence of mitral stenosis.   6. The aortic valve is tricuspid. There is moderate calcification of the aortic valve. There is moderate thickening of the aortic valve. Aortic valve regurgitation is not visualized. Mild to moderate aortic valve  sclerosis/calcification is present, without any evidence of aortic stenosis.   DVT prophylaxis:  SCD Code Status: full Family Communication: none at bedside Disposition: PT eval to help with  disposition as now says too weak for home   Status is: Inpatient  Remains inpatient appropriate because: blood loss requiring transfusion and possibility for need to repeat, need repeat CBC done in AM, possible more transfusion of PRBC    Consultants:  GI  Procedures:  Liver USKoreaoppler CXR UsKoreascites Colonoscopy planned 10/15  Antimicrobials:  -ceftriaxone 10/13- current, SBP prophylaxis per gi  Subjective: Pt sitting up in a chair, says he feels to weak to manage by himself at home.    Objective: Vitals:   10/17/20 2052 10/17/20 2053 10/18/20 0504 10/18/20 0504  BP: 110/77 110/77 (!) 103/58 (!) 103/58  Pulse: 89 99 94 95  Resp: 18 18 20 20   Temp: 99.2 F (37.3 C) 99.2 F (37.3 C) 98.5 F (36.9 C) 98.5 F (36.9 C)  TempSrc: Oral Oral Oral Oral  SpO2:  94% 93% 93%  Weight:    123.9 kg  Height:        Intake/Output Summary (Last 24 hours) at 10/18/2020 1136 Last data filed at 10/18/2020 0824 Gross per 24 hour  Intake 693 ml  Output --  Net 693 ml   Filed Weights   10/16/20 0500 10/17/20 0531 10/18/20 0504  Weight: 124.7 kg 125.6 kg 123.9 kg    Examination:  General exam: sitting up in a chair, awake, alert, cooperative, Appears tired, improved pallor, conversational   Respiratory system: Clear to auscultation. Respiratory effort normal, limited inspiration due  to abdominal size   Cardiovascular system: normal S1 & S2 heard. No JVD, murmurs, rubs, gallops or clicks. Some pedal edema. Decreased pedal pulses bilaterally, less than 2 seconds capillary refill in his feel bilaterally   Gastrointestinal system: Abdomen is large, reduced lower abd edema compared to yesterday, mild LUQ tenderness, non tympanic, with present bowel sounds   Central nervous system: Alert and oriented. No focal neurological deficits.   Extremities: moves all extremities appropriately, reports improved foot apin, reports worsened when his hemoglobin began to decline, decreased pedal  pulses with normal cap refill in his feet   Skin: excoriations, no overt bruising or rashes   Psychiatry: Judgement and insight appear normal. Mood & affect appropriate.   Data Reviewed: I have personally reviewed following labs and imaging studies  CBC: Recent Labs  Lab 10/14/20 1102 10/15/20 0538 10/16/20 0529 10/17/20 0428 10/17/20 1135 10/18/20 0532  WBC 6.0 4.2 4.4 4.7 5.4 4.5  NEUTROABS 4.4  --   --   --   --   --   HGB 7.1* 5.8* 7.7* 8.7* 9.4* 8.2*  HCT 25.8* 21.9* 26.3* 30.6* 33.1* 29.4*  MCV 89.3 89.8 88.9 89.7 91.9 90.5  PLT 162 129* 123* 104* 116* 107*    Basic Metabolic Panel: Recent Labs  Lab 10/14/20 1102 10/15/20 0538 10/16/20 0529 10/17/20 0428 10/18/20 0532  NA 137 136 137 136 134*  K 4.1 4.2 3.8 3.9 3.9  CL 105 105 106 104 103  CO2 24 22 24 26 25   GLUCOSE 151* 122* 108* 97 99  BUN 78* 75* 68* 60* 52*  CREATININE 2.68* 2.70* 2.49* 2.32* 2.20*  CALCIUM 8.6* 8.5* 8.3* 8.2* 8.4*  MG  --   --  2.2  --   --     GFR: Estimated Creatinine Clearance: 38.7 mL/min (A) (by C-G formula based on SCr of 2.2 mg/dL (H)).  Liver Function Tests: Recent Labs  Lab 10/14/20 1102 10/17/20 0428 10/18/20 0532  AST 34 17 17  ALT 31 20 16   ALKPHOS 75 58 60  BILITOT 0.9 0.9 0.8  PROT 7.0 6.3* 6.0*  ALBUMIN 3.6 3.2* 3.1*    CBG: Recent Labs  Lab 10/17/20 0724 10/17/20 1604 10/17/20 2055 10/18/20 0719 10/18/20 1120  GLUCAP 103* 139* 162* 90 92    Recent Results (from the past 240 hour(s))  Resp Panel by RT-PCR (Flu A&B, Covid) Nasopharyngeal Swab     Status: None   Collection Time: 10/14/20  2:15 PM   Specimen: Nasopharyngeal Swab; Nasopharyngeal(NP) swabs in vial transport medium  Result Value Ref Range Status   SARS Coronavirus 2 by RT PCR NEGATIVE NEGATIVE Final    Comment: (NOTE) SARS-CoV-2 target nucleic acids are NOT DETECTED.  The SARS-CoV-2 RNA is generally detectable in upper respiratory specimens during the acute phase of infection. The  lowest concentration of SARS-CoV-2 viral copies this assay can detect is 138 copies/mL. A negative result does not preclude SARS-Cov-2 infection and should not be used as the sole basis for treatment or other patient management decisions. A negative result may occur with  improper specimen collection/handling, submission of specimen other than nasopharyngeal swab, presence of viral mutation(s) within the areas targeted by this assay, and inadequate number of viral copies(<138 copies/mL). A negative result must be combined with clinical observations, patient history, and epidemiological information. The expected result is Negative.  Fact Sheet for Patients:  EntrepreneurPulse.com.au  Fact Sheet for Healthcare Providers:  IncredibleEmployment.be  This test is no t yet approved or cleared  by the [place] and  has been authorized for detection and/or diagnosis of SARS-CoV-2 by FDA under an Emergency Use Authorization (EUA). This EUA will remain  in effect (meaning this test can be used) for the duration of the COVID-19 declaration under Section 564(b)(1) of the Act, 21 U.S.C.section 360bbb-3(b)(1), unless the authorization is terminated  or revoked sooner.       Influenza A by PCR NEGATIVE NEGATIVE Final   Influenza B by PCR NEGATIVE NEGATIVE Final    Comment: (NOTE) The Xpert Xpress SARS-CoV-2/FLU/RSV plus assay is intended as an aid in the diagnosis of influenza from Nasopharyngeal swab specimens and should not be used as a sole basis for treatment. Nasal washings and aspirates are unacceptable for Xpert Xpress SARS-CoV-2/FLU/RSV testing.  Fact Sheet for Patients: EntrepreneurPulse.com.au  Fact Sheet for Healthcare Providers: IncredibleEmployment.be  This test is not yet approved or cleared by the [place] FDA and has been authorized for detection and/or diagnosis of SARS-CoV-2 by FDA under  an Emergency Use Authorization (EUA). This EUA will remain in effect (meaning this test can be used) for the duration of the COVID-19 declaration under Section 564(b)(1) of the Act, 21 U.S.C. section 360bbb-3(b)(1), unless the authorization is terminated or revoked.  Performed at [facility], [address], [place], [place] [address]      Radiology Studies: ECHOCARDIOGRAM COMPLETE  Result Date: 10/16/2020    ECHOCARDIOGRAM REPORT   Patient Name:   William Flores Date of Exam: 10/16/2020 Medical Rec #:  [id number]      Height:       64.0 in Accession #:    [id number]     Weight:       274.9 lb Date of Birth:  10-31-1950      BSA:          2.240 m Patient Age:    70 years       BP:           115/74 mmHg Patient Gender: M              HR:           79 bpm. Exam Location:  [facility] Procedure: 2D Echo, Cardiac Doppler and Color Doppler Indications:    CHF  History:        Patient has prior history of Echocardiogram examinations, most                 recent 06/21/2020. CHF, Arrythmias:Atrial Fibrillation,                 Signs/Symptoms:Syncope; Risk Factors:Hypertension, Diabetes and                 Dyslipidemia. Right Heart Failure, Nonischemic cardiomyopathy,                 Morbid obesity.  Sonographer:    Wenda Low Referring Phys: [place]  1. Left ventricular ejection fraction, by estimation, is 55 to 60%. The left ventricle has normal function. The left ventricle has no regional wall motion abnormalities. There is moderate concentric left ventricular hypertrophy. Left ventricular diastolic function could not be evaluated. There is the interventricular septum is flattened in systole and diastole, consistent with right ventricular pressure and volume overload.  2. Right ventricular systolic function is moderately reduced. The right ventricular size is severely enlarged.  3. Left atrial size was moderately dilated.  4. Right atrial size was severely dilated.  5. The  mitral  valve is degenerative. Mild mitral valve regurgitation. No evidence of mitral stenosis.  6. The aortic valve is tricuspid. There is moderate calcification of the aortic valve. There is moderate thickening of the aortic valve. Aortic valve regurgitation is not visualized. Mild to moderate aortic valve sclerosis/calcification is present, without any evidence of aortic stenosis. Comparison(s): No significant change from prior study. Conclusion(s)/Recommendation(s): Findings consistent with Cor Pulmonale. FINDINGS  Left Ventricle: Left ventricular ejection fraction, by estimation, is 55 to 60%. The left ventricle has normal function. The left ventricle has no regional wall motion abnormalities. Definity contrast agent was given IV to delineate the left ventricular  endocardial borders. The left ventricular internal cavity size was normal in size. There is moderate concentric left ventricular hypertrophy. The interventricular septum is flattened in systole and diastole, consistent with right ventricular pressure and volume overload. Left ventricular diastolic function could not be evaluated due to atrial fibrillation. Left ventricular diastolic function could not be evaluated. Right Ventricle: The right ventricular size is severely enlarged. No increase in right ventricular wall thickness. Right ventricular systolic function is moderately reduced. Left Atrium: Left atrial size was moderately dilated. Right Atrium: Right atrial size was severely dilated. Pericardium: Trivial pericardial effusion is present. Mitral Valve: The mitral valve is degenerative in appearance. Mild to moderate mitral annular calcification. Mild mitral valve regurgitation. No evidence of mitral valve stenosis. MV peak gradient, 10.0 mmHg. The mean mitral valve gradient is 2.0 mmHg. Tricuspid Valve: The tricuspid valve is grossly normal. Tricuspid valve regurgitation is mild . No evidence of tricuspid stenosis. Aortic Valve: The aortic valve  is tricuspid. There is moderate calcification of the aortic valve. There is moderate thickening of the aortic valve. Aortic valve regurgitation is not visualized. Mild to moderate aortic valve sclerosis/calcification is present, without any evidence of aortic stenosis. Aortic valve mean gradient measures 4.0 mmHg. Aortic valve peak gradient measures 9.0 mmHg. Aortic valve area, by VTI measures 2.01 cm. Pulmonic Valve: The pulmonic valve was grossly normal. Pulmonic valve regurgitation is not visualized. No evidence of pulmonic stenosis. Aorta: The aortic root and ascending aorta are structurally normal, with no evidence of dilitation. Venous: The inferior vena cava was not well visualized. IAS/Shunts: The atrial septum is grossly normal.  LEFT VENTRICLE PLAX 2D LVIDd:         5.50 cm   Diastology LVIDs:         3.50 cm   LV e' medial:    6.31 cm/s LV PW:         1.30 cm   LV E/e' medial:  21.6 LV IVS:        1.50 cm   LV e' lateral:   9.90 cm/s LVOT diam:     2.00 cm   LV E/e' lateral: 13.7 LV SV:         58 LV SV Index:   26 LVOT Area:     3.14 cm  RIGHT VENTRICLE RV Basal diam:  4.70 cm RV Mid diam:    3.90 cm RV S prime:     10.10 cm/s TAPSE (M-mode): 1.3 cm LEFT ATRIUM              Index        RIGHT ATRIUM           Index LA diam:        5.70 cm  2.55 cm/m   RA Area:     30.50 cm LA Vol (A2C):   137.0 ml 61.17 ml/m  RA Volume:   121.00 ml 54.03 ml/m LA Vol (A4C):   95.4 ml  42.60 ml/m LA Biplane Vol: 120.0 ml 53.58 ml/m  AORTIC VALVE                    PULMONIC VALVE AV Area (Vmax):    1.87 cm     PV Vmax:       0.57 m/s AV Area (Vmean):   2.03 cm     PV Peak grad:  1.3 mmHg AV Area (VTI):     2.01 cm AV Vmax:           150.00 cm/s AV Vmean:          96.900 cm/s AV VTI:            0.289 m AV Peak Grad:      9.0 mmHg AV Mean Grad:      4.0 mmHg LVOT Vmax:         89.20 cm/s LVOT Vmean:        62.600 cm/s LVOT VTI:          0.185 m LVOT/AV VTI ratio: 0.64  AORTA Ao Root diam: 3.20 cm Ao Asc diam:  3.20  cm MITRAL VALVE                TRICUSPID VALVE MV Area (PHT): 3.61 cm     TR Peak grad:   74.3 mmHg MV Area VTI:   1.74 cm     TR Vmax:        431.00 cm/s MV Peak grad:  10.0 mmHg MV Mean grad:  2.0 mmHg     SHUNTS MV Vmax:       1.58 m/s     Systemic VTI:  0.18 m MV Vmean:      46.3 cm/s    Systemic Diam: 2.00 cm MV Decel Time: 210 msec MV E velocity: 136.00 cm/s MV A velocity: 42.70 cm/s MV E/A ratio:  3.19 Eleonore Chiquito MD Electronically signed by Eleonore Chiquito MD Signature Date/Time: 10/16/2020/4:13:15 PM    Final     Scheduled Meds:  allopurinol  300 mg Oral Daily   atorvastatin  20 mg Oral QPM   ferrous sulfate  325 mg Oral Q breakfast   furosemide  40 mg Intravenous Q12H   insulin aspart  0-9 Units Subcutaneous TID WC   insulin glargine-yfgn  10 Units Subcutaneous Q2200   metoprolol succinate  75 mg Oral Daily   pantoprazole (PROTONIX) IV  40 mg Intravenous Q12H   potassium chloride SA  10 mEq Oral Daily   sildenafil  20 mg Oral TID   spironolactone  50 mg Oral Daily   Continuous Infusions:  cefTRIAXone (ROCEPHIN)  IV Stopped (10/17/20 1514)     LOS: 4 days   Irwin Brakeman, MD  How to contact the [facility] Attending or Consulting provider 7A - 7P or covering provider during after hours 7P -7A, for this patient?  Check the care team in [facility] and look for a) attending/consulting [facility] provider listed and b) the [facility] team listed Log into [email or web address] and use [place]'s universal password to access. If you do not have the password, please contact the hospital operator. Locate the [facility] provider you are looking for under [facility] and page to a number that you can be directly reached. If you still have difficulty reaching the provider, please page the [facility] (Director on Call) for the Hospitalists listed on amion for assistance.  10/18/2020, 11:36 AM

## 2020-10-19 ENCOUNTER — Encounter (HOSPITAL_COMMUNITY): Payer: Self-pay | Admitting: Gastroenterology

## 2020-10-19 ENCOUNTER — Telehealth: Payer: Self-pay | Admitting: Gastroenterology

## 2020-10-19 LAB — PROTIME-INR
INR: 1.5 — ABNORMAL HIGH (ref 0.8–1.2)
Prothrombin Time: 17.6 seconds — ABNORMAL HIGH (ref 11.4–15.2)

## 2020-10-19 LAB — CBC
HCT: 30.1 % — ABNORMAL LOW (ref 39.0–52.0)
Hemoglobin: 8.5 g/dL — ABNORMAL LOW (ref 13.0–17.0)
MCH: 26.1 pg (ref 26.0–34.0)
MCHC: 28.2 g/dL — ABNORMAL LOW (ref 30.0–36.0)
MCV: 92.3 fL (ref 80.0–100.0)
Platelets: 130 10*3/uL — ABNORMAL LOW (ref 150–400)
RBC: 3.26 MIL/uL — ABNORMAL LOW (ref 4.22–5.81)
RDW: 18.1 % — ABNORMAL HIGH (ref 11.5–15.5)
WBC: 4.3 10*3/uL (ref 4.0–10.5)
nRBC: 0.9 % — ABNORMAL HIGH (ref 0.0–0.2)

## 2020-10-19 LAB — BASIC METABOLIC PANEL
Anion gap: 6 (ref 5–15)
BUN: 47 mg/dL — ABNORMAL HIGH (ref 8–23)
CO2: 25 mmol/L (ref 22–32)
Calcium: 8.5 mg/dL — ABNORMAL LOW (ref 8.9–10.3)
Chloride: 103 mmol/L (ref 98–111)
Creatinine, Ser: 2.22 mg/dL — ABNORMAL HIGH (ref 0.61–1.24)
GFR, Estimated: 31 mL/min — ABNORMAL LOW (ref >60)
Glucose, Bld: 162 mg/dL — ABNORMAL HIGH (ref 70–99)
Potassium: 4.3 mmol/L (ref 3.5–5.1)
Sodium: 134 mmol/L — ABNORMAL LOW (ref 135–145)

## 2020-10-19 LAB — ANA: Anti Nuclear Antibody (ANA): NEGATIVE

## 2020-10-19 LAB — GLUCOSE, CAPILLARY
Glucose-Capillary: 91 mg/dL (ref 70–99)
Glucose-Capillary: 121 mg/dL — ABNORMAL HIGH (ref 70–99)
Glucose-Capillary: 157 mg/dL — ABNORMAL HIGH (ref 70–99)
Glucose-Capillary: 157 mg/dL — ABNORMAL HIGH (ref 70–99)
Glucose-Capillary: 134 mg/dL — ABNORMAL HIGH (ref 70–99)

## 2020-10-19 LAB — ALBUMIN: Albumin: 3.3 g/dL — ABNORMAL LOW (ref 3.5–5.0)

## 2020-10-19 LAB — FANA STAINING PATTERNS: Homogeneous Pattern: 1

## 2020-10-19 LAB — ANTINUCLEAR ANTIBODIES, IFA: ANA Ab, IFA: POSITIVE — AB

## 2020-10-19 MED ORDER — PANTOPRAZOLE SODIUM 40 MG PO TBEC
40.0000 mg | DELAYED_RELEASE_TABLET | Freq: Two times a day (BID) | ORAL | Status: DC
  Administered 2020-10-20 – 2020-10-24 (×8): 40 mg via ORAL
  Filled 2020-10-19 (×8): qty 1

## 2020-10-19 MED ORDER — FUROSEMIDE 40 MG PO TABS
40.0000 mg | ORAL_TABLET | Freq: Every day | ORAL | Status: DC
  Administered 2020-10-21: 40 mg via ORAL
  Filled 2020-10-19 (×2): qty 1

## 2020-10-19 NOTE — Progress Notes (Signed)
PROGRESS NOTE   William Flores  FWY:[id number] DOB: 10-Jan-1950 DOA: 10/14/2020 PCP: [name], [name], MD   Chief Complaint  Patient presents with   Gastroesophageal Reflux   Level of care: Telemetry  Brief Admission History:  William Flores is a medically complex gentleman who presents with multiple complaints including increasing generalized weakness and debility, increasing abdominal distention, uncontrolled acid reflux symptoms, shortness of breath, inability to lie recumbent, DOE, increasing edema in lower extremities, occasional black stools, and worsening LUQ abd pain.  He is followed by the heart failure team in [place].  He has pulmonary hypertension and is on sildenafil TID. He was found to be anemic, hemoglobin at 7.1 on admission 10/12,  5.8 on 10/13, requiring 2x PRBC, now 7.7 10/14. Warfarin was stopped, vitamin K given, IV lasix was given for his abdominal edema and small volume ascites. IV protonix bid has been continued. Pulmonary edema and pleural effusions noted 10/13 on CXR, lasix 14m bid continued. Gi did colonoscopy 10/15 with findings of large rectal polyps with intermittent oozing from polyp, biopsies taken.    Assessment & Plan:   GI bleed /   Fecal occult blood test positive -anemic 5.8 normocytic, 2x PRBC ordered 10/13 -hemoglobin 7.7 after 2x prbc  -GI did colonoscopy 10/15 with findings of large rectal polyps with intermittent oozing from polyp, biopsies taken - GI recommends restarting warfarin in 1 week (10/24/20)     Acute blood loss anemia -secondary to lower GI bleed, GI consulted, transfused, stable at this time - Hg down to 8.2 today. - recheck CBC daily to follow Hg     Normocytic anemia -10/12 hemoglobin of 7.1 -10/13, hemoglobin of 5.8 with two units of PRBC ordered 10/14 hgb 7.7, 10/15 9.4 -GI consulted for GI bleed, colonoscopy planned -continue oral iron supplementation      Anticoagulated on Coumadin -discontinued warfarin  10/12 -556mvitamin K given 10/12 -7.5 mg vitamin k  10/13 -Initial INR 3.6 on 10/12,  10/13 3.2, 10/14 1.8 - per GI can restart warfarin in 1 week  10/24/20   Acquired thrombophilia -platelets 162 10/12 -platelets 123 10/14 -restart warfarin on 10/22 per GI     Morbid obesity with BMI of 45.0-49.9, adult -outpatient lifestyle modifications -plausible obesity hypoventilation     Liver cirrhosis  -suggested on CT abdomen, Gi consulted -abdominal ultrasound shows cirrhosis with splenomegaly and perihepatic ascites. Findings are suggestive for portal hypertension. Portal venous system is patent with normal direction of flow.  -Low volume ascites insufficient for paracentesis - outpatient      Gout -no current complaints -continue allopurinol     Hypertensive heart disease -currently normotensive, significant RV dilation from pulmonary vascular hypertension -echocardiogram ordered -last echo 06/21/20 50/55% LVEF     History of colon cancer -previous colonoscopy with several polyps removed - frequent polyps seen on 10/15 - repeat colon survey recommended in 3 months      Right heart failure  -significant right ventricle dilation with evidence of pulmonary artery hypertension -bnp 10/12 340, 10/13 273, 10/14 341     OSA (obstructive sleep apnea) -uses CPAP at home     Nonischemic cardiomyopathy -complete cardiac echo ordered - last echo 06/21/20 50/55% LVEF     Atrial fibrillation with slow ventricular response -currently in Afib, rate in the 80's -was anticoagulated INR 3.6 -Stopped coumadin due to gi bleed     DM (diabetes mellitus), type 2 with renal complications  -[id number] 8+ -SSI -carb modified diet  CBG (  last 3)  Recent Labs    10/18/20 2058 10/19/20 0714 10/19/20 1103  GLUCAP 147* 121* 157*      Essential hypertension -continue metoprolol -hold farxiga -hold torsemide -continue lasix daily  -GI team added spironolactone 50 mg daily  -monitoring  electrolytes      MGUS (monoclonal gammopathy of unknown significance)     CKD stage 3 due to type 2 diabetes mellitus  -Consider consulting nephrology -baseline creatinine 2-2.2,  2.68 10/14/20, 10/13 2.7, 10/14 2.49 -does not appear dehydrated and questionable if this is a progression of his CKD -monitoring renal function     Hyperlipidemia associated with type 2 diabetes mellitus  -continue atorvastatin     Pulmonary hypertension -continue sildenafil    Peripheral edema /  Bilateral leg edema -Lasix 31m daily and spironolactone 50 mg daily  -currently no concern for DVT as he was very recently supraterapeutic INR -monitoring electrolytes and renal function    Acute on chronic congestive heart failure  -echocardiogram ordered -Intake and output tracking -trending BNP Echocardiogram:  IMPRESSIONS   1. Left ventricular ejection fraction, by estimation, is 55 to 60%. The left ventricle has normal function. The left ventricle has no regional wall motion abnormalities. There is moderate concentric left ventricular  hypertrophy. Left ventricular diastolic function could not be evaluated. There is the interventricular  septum is flattened in systole and diastole, consistent with right ventricular pressure and volume overload.   2. Right ventricular systolic function is moderately reduced. The right ventricular size is severely enlarged.   3. Left atrial size was moderately dilated.   4. Right atrial size was severely dilated.   5. The mitral valve is degenerative. Mild mitral valve regurgitation. No evidence of mitral stenosis.   6. The aortic valve is tricuspid. There is moderate calcification of the aortic valve. There is moderate thickening of the aortic valve. Aortic valve regurgitation is not visualized. Mild to moderate aortic valve  sclerosis/calcification is present, without any evidence of aortic stenosis.   DVT prophylaxis:  SCD Code Status: full Family Communication:  none at bedside [facility]    Status is: Inpatient  Remains inpatient appropriate because: blood loss requiring transfusion and possibility for need to repeat, need repeat CBC done in AM, he remains on IV lasix at this time     Consultants:  GI  Procedures:  Liver [place] CXR [place] Colonoscopy planned 10/15  Antimicrobials:  -ceftriaxone 10/13- current, SBP prophylaxis per gi  Subjective: Pt reports that overall he is getting better, he remains very weak.      Objective: Vitals:   10/18/20 1305 10/18/20 2101 10/19/20 0534 10/19/20 0742  BP: 127/82 (!) 144/77 135/81 102/61  Pulse: 98 (!) 101 (!) 111 84  Resp: 20 20 18 20   Temp: 98.2 F (36.8 C) 98.2 F (36.8 C) 97.6 F (36.4 C) 98.1 F (36.7 C)  TempSrc: Oral Oral Oral Oral  SpO2: 94% 96% 96% 96%  Weight:   124.6 kg   Height:        Intake/Output Summary (Last 24 hours) at 10/19/2020 1120 Last data filed at 10/19/2020 0900 Gross per 24 hour  Intake 1240 ml  Output --  Net 1240 ml   Filed Weights   10/17/20 0531 10/18/20 0504 10/19/20 0534  Weight: 125.6 kg 123.9 kg 124.6 kg    Examination:  General exam: sitting up in a chair, awake, alert, cooperative, Appears chronically ill.    Respiratory  system: Clear to auscultation. Respiratory effort normal, limited inspiration due to abdominal size   Cardiovascular system: normal S1 & S2 heard. No JVD, murmurs, rubs, gallops or clicks. Some pedal edema. Decreased pedal pulses bilaterally, less than 2 seconds capillary refill in his feel bilaterally   Gastrointestinal system: Abdomen is large, markedly reduced abd edema, mild LUQ tenderness, non tympanic, with present bowel sounds   Central nervous system: Alert and oriented. No focal neurological deficits.   Extremities: moves all extremities appropriately, reports improved foot apin, reports worsened when his hemoglobin began to decline, decreased pedal  pulses with normal cap refill in his feet   Skin: excoriations, no overt bruising or rashes   Psychiatry: Judgement and insight appear normal. Mood & affect appropriate.   Data Reviewed: I have personally reviewed following labs and imaging studies  CBC: Recent Labs  Lab 10/14/20 1102 10/15/20 0538 10/16/20 0529 10/17/20 0428 10/17/20 1135 10/18/20 0532 10/19/20 0038  WBC 6.0   < > 4.4 4.7 5.4 4.5 4.3  NEUTROABS 4.4  --   --   --   --   --   --   HGB 7.1*   < > 7.7* 8.7* 9.4* 8.2* 8.5*  HCT 25.8*   < > 26.3* 30.6* 33.1* 29.4* 30.1*  MCV 89.3   < > 88.9 89.7 91.9 90.5 92.3  PLT 162   < > 123* 104* 116* 107* 130*   < > = values in this interval not displayed.    Basic Metabolic Panel: Recent Labs  Lab 10/15/20 0538 10/16/20 0529 10/17/20 0428 10/18/20 0532 10/19/20 0038  NA 136 137 136 134* 134*  K 4.2 3.8 3.9 3.9 4.3  CL 105 106 104 103 103  CO2 22 24 26 25 25   GLUCOSE 122* 108* 97 99 162*  BUN 75* 68* 60* 52* 47*  CREATININE 2.70* 2.49* 2.32* 2.20* 2.22*  CALCIUM 8.5* 8.3* 8.2* 8.4* 8.5*  MG  --  2.2  --   --   --     GFR: Estimated Creatinine Clearance: 38.5 mL/min (A) (by C-G formula based on SCr of 2.22 mg/dL (H)).  Liver Function Tests: Recent Labs  Lab 10/14/20 1102 10/17/20 0428 10/18/20 0532 10/19/20 0038  AST 34 17 17  --   ALT 31 20 16   --   ALKPHOS 75 58 60  --   BILITOT 0.9 0.9 0.8  --   PROT 7.0 6.3* 6.0*  --   ALBUMIN 3.6 3.2* 3.1* 3.3*    CBG: Recent Labs  Lab 10/18/20 1120 10/18/20 1608 10/18/20 2058 10/19/20 0714 10/19/20 1103  GLUCAP 92 124* 147* 121* 157*    Recent Results (from the past 240 hour(s))  Resp Panel by RT-PCR (Flu A&B, Covid) Nasopharyngeal Swab     Status: None   Collection Time: 10/14/20  2:15 PM   Specimen: Nasopharyngeal Swab; Nasopharyngeal(NP) swabs in vial transport medium  Result Value Ref Range Status   SARS Coronavirus 2 by RT PCR NEGATIVE NEGATIVE Final    Comment: (NOTE) SARS-CoV-2 target  nucleic acids are NOT DETECTED.  The SARS-CoV-2 RNA is generally detectable in upper respiratory specimens during the acute phase of infection. The lowest concentration of SARS-CoV-2 viral copies this assay can detect is 138 copies/mL. A negative result does not preclude SARS-Cov-2 infection and should not be used as the sole basis for treatment or other patient management decisions. A negative result may occur with  improper specimen collection/handling, submission of specimen other than nasopharyngeal swab, presence  of viral mutation(s) within the areas targeted by this assay, and inadequate number of viral copies(<138 copies/mL). A negative result must be combined with clinical observations, patient history, and epidemiological information. The expected result is Negative.  Fact Sheet for Patients:  EntrepreneurPulse.com.au  Fact Sheet for Healthcare Providers:  IncredibleEmployment.be  This test is no t yet approved or cleared by the [place] FDA and  has been authorized for detection and/or diagnosis of SARS-CoV-2 by FDA under an Emergency Use Authorization (EUA). This EUA will remain  in effect (meaning this test can be used) for the duration of the COVID-19 declaration under Section 564(b)(1) of the Act, 21 U.S.C.section 360bbb-3(b)(1), unless the authorization is terminated  or revoked sooner.       Influenza A by PCR NEGATIVE NEGATIVE Final   Influenza B by PCR NEGATIVE NEGATIVE Final    Comment: (NOTE) The Xpert Xpress SARS-CoV-2/FLU/RSV plus assay is intended as an aid in the diagnosis of influenza from Nasopharyngeal swab specimens and should not be used as a sole basis for treatment. Nasal washings and aspirates are unacceptable for Xpert Xpress SARS-CoV-2/FLU/RSV testing.  Fact Sheet for Patients: EntrepreneurPulse.com.au  Fact Sheet for Healthcare  Providers: IncredibleEmployment.be  This test is not yet approved or cleared by the [place] FDA and has been authorized for detection and/or diagnosis of SARS-CoV-2 by FDA under an Emergency Use Authorization (EUA). This EUA will remain in effect (meaning this test can be used) for the duration of the COVID-19 declaration under Section 564(b)(1) of the Act, 21 U.S.C. section 360bbb-3(b)(1), unless the authorization is terminated or revoked.  Performed at [facility], [address] [place],  [address]      Radiology Studies: No results found.  Scheduled Meds:  allopurinol  300 mg Oral Daily   atorvastatin  20 mg Oral QPM   ferrous sulfate  325 mg Oral Q breakfast   furosemide  40 mg Intravenous Daily   insulin aspart  0-9 Units Subcutaneous TID WC   insulin glargine-yfgn  10 Units Subcutaneous Q2200   metoprolol succinate  75 mg Oral Daily   [START ON 10/20/2020] pantoprazole  40 mg Oral BID AC   potassium chloride SA  10 mEq Oral Daily   sildenafil  20 mg Oral TID   spironolactone  50 mg Oral Daily   Continuous Infusions:  cefTRIAXone (ROCEPHIN)  IV 2 g (10/18/20 1437)    LOS: 5 days   Irwin Brakeman, MD  How to contact the [facility] Attending or Consulting provider [place] or covering provider during after hours [place], for this patient?  Check the care team in [employer] and look for a) attending/consulting [facility] provider listed and b) the [facility] team listed Log into [email or web address] and use [place]'s universal password to access. If you do not have the password, please contact the hospital operator. Locate the [facility] provider you are looking for under [facility] and page to a number that you can be directly reached. If you still have difficulty reaching the provider, please page the [facility] (Director on Call) for the Hospitalists listed on amion for assistance.  10/19/2020, 11:20 AM

## 2020-10-19 NOTE — Telephone Encounter (Signed)
William Flores: looks like patient is already scheduled in November, which is good. Needed a hospital follow-up.  Can we also have him repeat a CBC in 1 week? Thanks! Reason: IDA

## 2020-10-19 NOTE — Progress Notes (Signed)
Subjective: No overt GI bleeding. No N/V. No abdominal pain. No mental status changes or confusion. States abdomen feels same it has felt all admission. Denies feeling distended or bloated.   Objective: Vital signs in last 24 hours: Temp:  [97.6 F (36.4 C)-98.2 F (36.8 C)] 98 F (36.7 C) (10/17 1255) Pulse Rate:  [84-111] 86 (10/17 1255) Resp:  [18-20] 20 (10/17 1255) BP: (102-144)/(61-81) 106/64 (10/17 1255) SpO2:  [96 %-97 %] 97 % (10/17 1255) Weight:  [124.6 kg] 124.6 kg (10/17 0534) Last BM Date: 10/17/20 General:   Alert and oriented, pleasant Head:  Normocephalic and atraumatic. Abdomen:  Bowel sounds present, obese with large AP diameter, no TTP. Distended but not tense.  Extremities:  1+ pedal edema Neurologic:  Alert and  oriented x4   Intake/Output from previous day: 10/16 0701 - 10/17 0700 In: 1200 [P.O.:1200] Out: -  Intake/Output this shift: Total I/O In: 480 [P.O.:480] Out: -   Lab Results: Recent Labs    10/17/20 1135 10/18/20 0532 10/19/20 0038  WBC 5.4 4.5 4.3  HGB 9.4* 8.2* 8.5*  HCT 33.1* 29.4* 30.1*  PLT 116* 107* 130*   BMET Recent Labs    10/17/20 0428 10/18/20 0532 10/19/20 0038  NA 136 134* 134*  K 3.9 3.9 4.3  CL 104 103 103  CO2 26 25 25   GLUCOSE 97 99 162*  BUN 60* 52* 47*  CREATININE 2.32* 2.20* 2.22*  CALCIUM 8.2* 8.4* 8.5*   LFT Recent Labs    10/17/20 0428 10/18/20 0532 10/19/20 0038  PROT 6.3* 6.0*  --   ALBUMIN 3.2* 3.1* 3.3*  AST 17 17  --   ALT 20 16  --   ALKPHOS 58 60  --   BILITOT 0.9 0.8  --    PT/INR Recent Labs    10/17/20 0428 10/19/20 0038  LABPROT 17.5* 17.6*  INR 1.4* 1.5*      Assessment: 70 year old male with history of a fib on chronic anticoagulation (Coumadin), cardiomyopathy, pulmonary hypertension and is on chronic oxygen, IDA, presenting with acute on chronic anemia, heme positive stools.  Anemia: history of IDA with prior hospitalization at [facility] in June 2022 undergoing  push enteroscopy without significant findings (chronic gastritis). Prior evaluation for IDA by Dr. Laural Golden in 2020 s/p EGD and colonoscopy (multiple tubular adenomas). Hgb down to 5.8 this admission, receiving 3 units total PRBCs. Hgb 8.5 today, stable for past 24 hours. Colonoscopy completed this admission with numerous polyps, one with inflammatory appearance and intermittent oozing, clips placed for hemostasis. If worsening anemia, will need Agile capsule followed by capsule study. As he is stable, this can be pursued as outpatient.   Cirrhosis: likely secondary to NASH. Large AP diameter makes it difficult to assess if true ascites or body habitus. However, he has a recent US on file without sufficient ascites for para. Doppler ultrasound with patent portal venous system. ANA, AMA, negative. ASMA weakly positive and IgG normal. Ceruloplasmin mildly elevated at 33.3. Negative acute hepatitis panel. Likely dealing with NASH. Will need follow-up care as outpatient. Contiue with Lasix 40 mg daily and spironolactone 50 mg daily, Will need close monitoring of renal function.    Plan: Follow CBC as outpatient: will need repeat in 1 week Resume Coumadin on 10/22 Awaiting pathology results from colonoscopy Oral iron at discharge Will need colonoscopy in 3 months for early interval surveillance Follow-up as outpatient with Dr. Jenetta Downer GI will sign off   Annitta Needs, PhD, ANP-BC [facility]  LOS: 5 days    10/19/2020, 1:16 PM

## 2020-10-19 NOTE — Care Management Important Message (Signed)
Important Message  Patient Details  Name: William Flores MRN: [id number] Date of Birth: 1950/10/18   Medicare Important Message Given:  Yes     [name] 10/19/2020, 11:42 AM

## 2020-10-19 NOTE — Evaluation (Signed)
Physical Therapy Evaluation Patient Details Name: William Flores MRN: [id number] DOB: 1950/11/09 Today's Date: 10/19/2020  History of Present Illness  [name] is a medically complex gentleman who presents with multiple complaints including increasing generalized weakness and debility, increasing abdominal distention, uncontrolled acid reflux symptoms, shortness of breath, inability to lie recumbent, DOE, increasing edema in lower extremities and occasional black stools.  He is followed by the heart failure team in [place].  He has pulmonary hypertension and is on sildenafil TID.  He is a poor historian but reports that he believes that he is taking his medications as prescribed.  However I am concerned that he does need more assistance with managing his medications at home.     On physical exam he presents with marked anasarca and abdominal distention.  He is tachypneic and unable to lie flat.  He is grossly volume overloaded.  He is Hemoccult positive.  His INR is greater than 3.  He had a CT of the abdomen and chest with findings of pulmonary edema.  He has been started on IV Lasix for diuresis.  He has been started on IV Protonix for presumed upper GI bleed.  He is being seen by the inpatient GI service.   Clinical Impression   Patient able to demonstrate functional mobility at an independent to modified independent level for bed mobility, transfers, and ambulation using RW.  Patient's main limitation being limited gait tolerance/endurance requiring frequent seated rest periods.  Demonstrates good safety awareness with O2 and AD and able to communicate symptoms/exertion effectively.  Patient would benefit from walks on the unit with staff supervision and assistance as needed for O2 management to enable increased ambulation tolerance.  No adverse reactions during PT session and vitals maintained WNL using 2 L O2 via [place].  No further PT services indicated at this time as pt is safe to ambulate  with NSG staff on a maintenance basis.  Patient discharged to care of nursing for ambulation daily as tolerated for length of stay.        Recommendations for follow up therapy are one component of a multi-disciplinary discharge planning process, led by the attending physician.  Recommendations may be updated based on patient status, additional functional criteria and insurance authorization.  Follow Up Recommendations No PT follow up    Equipment Recommendations  None recommended by PT    Recommendations for Other Services       Precautions / Restrictions        Mobility  Bed Mobility Overal bed mobility: Independent               Patient Response: Cooperative  Transfers Overall transfer level: Independent                  Ambulation/Gait Ambulation/Gait assistance: Modified independent (Device/Increase time) Gait Distance (Feet): 50 Feet Assistive device: Rolling walker (2 wheeled) (portable O2 at 2 L) Gait Pattern/deviations: WFL(Within Functional Limits)        Stairs            Wheelchair Mobility    Modified Rankin (Stroke Patients Only)       Balance Overall balance assessment: Modified Independent                                           Pertinent Vitals/Pain Pain Assessment: No/denies pain    Home Living Family/patient  expects to be discharged to:: Private residence Living Arrangements: Alone Available Help at Discharge: Family;Available PRN/intermittently Type of Home: Mobile home Home Access: Stairs to enter Entrance Stairs-Rails: Right;Left;Can reach both Entrance Stairs-Number of Steps: 4 Home Layout: One level Home Equipment: Walker - 2 wheels;Walker - 4 wheels;Cane - single point Additional Comments: Pt on home O2 most of time    Prior Function Level of Independence: Independent with assistive device(s)         Comments: uses walker or rollator as needed     Hand Dominance   Dominant Hand:  Right    Extremity/Trunk Assessment   Upper Extremity Assessment Upper Extremity Assessment: Overall WFL for tasks assessed    Lower Extremity Assessment Lower Extremity Assessment: Overall WFL for tasks assessed       Communication   Communication: No difficulties  Cognition Arousal/Alertness: Awake/alert Behavior During Therapy: WFL for tasks assessed/performed Overall Cognitive Status: Within Functional Limits for tasks assessed                                        General Comments      Exercises General Exercises - Lower Extremity Ankle Circles/Pumps: Strengthening;Both;20 reps Long Arc Quad: Strengthening;Both;20 reps Straight Leg Raises: Strengthening;Both;20 reps   Assessment/Plan    PT Assessment Patent does not need any further PT services (Patient demonstrates independent to modified independent functional mobility with main limitation being activity tolerance)  PT Problem List         PT Treatment Interventions      PT Goals (Current goals can be found in the Care Plan section)  Acute Rehab PT Goals Patient Stated Goal: "Get my endurance up" PT Goal Formulation: With patient Time For Goal Achievement: 10/19/20 Potential to Achieve Goals: Good    Frequency     Barriers to discharge        Co-evaluation               AM-PAC PT "6 Clicks" Mobility  Outcome Measure Help needed turning from your back to your side while in a flat bed without using bedrails?: None Help needed moving from lying on your back to sitting on the side of a flat bed without using bedrails?: None Help needed moving to and from a bed to a chair (including a wheelchair)?: None Help needed standing up from a chair using your arms (e.g., wheelchair or bedside chair)?: None Help needed to walk in hospital room?: None Help needed climbing 3-5 steps with a railing? : A Little 6 Click Score: 23    End of Session   Activity Tolerance: Patient tolerated  treatment well;Patient limited by fatigue Patient left: in bed;with call bell/phone within reach Nurse Communication: Mobility status PT Visit Diagnosis: Muscle weakness (generalized) (M62.81);Difficulty in walking, not elsewhere classified (R26.2)    Time: 1000-1025 PT Time Calculation (min) (ACUTE ONLY): 25 min   Charges:   PT Evaluation $PT Eval Low Complexity: 1 Low PT Treatments $Gait Training: 8-22 mins $Therapeutic Exercise: 8-22 mins       10:42 AM, 10/19/20 M. Sherlyn Lees, PT, DPT Physical Therapist- [place] Office Number: [phone]

## 2020-10-20 DIAGNOSIS — K922 Gastrointestinal hemorrhage, unspecified: Secondary | ICD-10-CM

## 2020-10-20 LAB — BASIC METABOLIC PANEL
Anion gap: 5 (ref 5–15)
BUN: 47 mg/dL — ABNORMAL HIGH (ref 8–23)
CO2: 27 mmol/L (ref 22–32)
Calcium: 8.6 mg/dL — ABNORMAL LOW (ref 8.9–10.3)
Chloride: 105 mmol/L (ref 98–111)
Creatinine, Ser: 2.35 mg/dL — ABNORMAL HIGH (ref 0.61–1.24)
GFR, Estimated: 29 mL/min — ABNORMAL LOW (ref >60)
Glucose, Bld: 142 mg/dL — ABNORMAL HIGH (ref 70–99)
Potassium: 5 mmol/L (ref 3.5–5.1)
Sodium: 137 mmol/L (ref 135–145)

## 2020-10-20 LAB — CBC
HCT: 29.3 % — ABNORMAL LOW (ref 39.0–52.0)
Hemoglobin: 8.2 g/dL — ABNORMAL LOW (ref 13.0–17.0)
MCH: 26.3 pg (ref 26.0–34.0)
MCHC: 28 g/dL — ABNORMAL LOW (ref 30.0–36.0)
MCV: 93.9 fL (ref 80.0–100.0)
Platelets: 108 10*3/uL — ABNORMAL LOW (ref 150–400)
RBC: 3.12 MIL/uL — ABNORMAL LOW (ref 4.22–5.81)
RDW: 18.7 % — ABNORMAL HIGH (ref 11.5–15.5)
WBC: 4.7 10*3/uL (ref 4.0–10.5)
nRBC: 0.4 % — ABNORMAL HIGH (ref 0.0–0.2)

## 2020-10-20 LAB — MITOCHONDRIAL ANTIBODIES: Mitochondrial M2 Ab, IgG: <20 Units (ref 0.0–20.0)

## 2020-10-20 LAB — PROTIME-INR
INR: 1.5 — ABNORMAL HIGH (ref 0.8–1.2)
Prothrombin Time: 17.6 seconds — ABNORMAL HIGH (ref 11.4–15.2)

## 2020-10-20 LAB — GLUCOSE, CAPILLARY
Glucose-Capillary: 129 mg/dL — ABNORMAL HIGH (ref 70–99)
Glucose-Capillary: 148 mg/dL — ABNORMAL HIGH (ref 70–99)
Glucose-Capillary: 127 mg/dL — ABNORMAL HIGH (ref 70–99)
Glucose-Capillary: 130 mg/dL — ABNORMAL HIGH (ref 70–99)

## 2020-10-20 LAB — SURGICAL PATHOLOGY

## 2020-10-20 LAB — PREPARE RBC (CROSSMATCH)

## 2020-10-20 LAB — HEMOGLOBIN AND HEMATOCRIT, BLOOD
HCT: 27.6 % — ABNORMAL LOW (ref 39.0–52.0)
Hemoglobin: 7.7 g/dL — ABNORMAL LOW (ref 13.0–17.0)

## 2020-10-20 LAB — ANTI-SMOOTH MUSCLE ANTIBODY, IGG: F-Actin IgG: 15 Units (ref 0–19)

## 2020-10-20 MED ORDER — SODIUM CHLORIDE 0.9% IV SOLUTION: Freq: Once | INTRAVENOUS | Status: DC

## 2020-10-20 NOTE — Progress Notes (Signed)
Spoke with patient today, he presented total of 3 bloody bowel movements today, each of them was small in amount but he had some drop in his hemoglobin down to 7.7.  We will keep him on clear liquids and n.p.o. after midnight.  We will also transfuse him 1 unit of PRBC and 2 units of FFP, recheck INR and H&H in the morning.  Depending on clinical course, may need to repeat colonoscopy tomorrow with prep in the morning.  Patient understood and agreed.  Maylon Peppers, MD Gastroenterology and Hepatology [facility]

## 2020-10-20 NOTE — Progress Notes (Signed)
10/20/2020 5:24 PM  Received communication from Dr. Jenetta Downer regarding H/H results.  He requested to give 2 units FFP and 1 unit PRBC.  I ordered and notified blood bank of the order.  Keep 2 units ahead of PRBC in case needed.    Gerlene Fee, MD How to contact the [facility] Attending or Consulting provider [place] or covering provider during after hours [place], for this patient?  Check the care team in [facility] and look for a) attending/consulting TRH provider listed and b) the [facility] team listed Log into [email or web address] and use [place]'s universal password to access. If you do not have the password, please contact the hospital operator. Locate the [facility] provider you are looking for under [facility] and page to a number that you can be directly reached. If you still have difficulty reaching the provider, please page the [facility] (Director on Call) for the Hospitalists listed on amion for assistance.

## 2020-10-20 NOTE — Progress Notes (Signed)
Patient refused cpap at this time.  Does not want to wear. Machine is not in the room.

## 2020-10-20 NOTE — Progress Notes (Signed)
Subjective: Developed acute onset rectal bleeding yesterday evening, total of 3 Bms with bright red blood and dark red clots.  Last bowel movement around 9:30-10:00 p.m. last night. Stools were loose yesterday.  Has not experienced anything like this in a few years.  Reports he did have some rectal bleeding after a prior colonoscopy, but cannot remember when this was.  I do not see report of this in the system.  Denies black stools, abdominal pain, nausea, vomiting.  Denies lightheadedness or pre-syncope. Nothing to eat so far today.   Denies mental status changes.  Reports swelling in his abdomen and lower extremities have been improving since admission.  Objective: Vital signs in last 24 hours: Temp:  [97.9 F (36.6 C)-98 F (36.7 C)] 97.9 F (36.6 C) (10/18 0605) Pulse Rate:  [80-86] 80 (10/18 0605) Resp:  [18-20] 18 (10/18 0605) BP: (100-116)/(63-69) 100/69 (10/18 0605) SpO2:  [97 %-99 %] 98 % (10/18 0605) Weight:  [124.6 kg] 124.6 kg (10/18 0605) Last BM Date: 10/19/20 General:   Alert and oriented, pleasant, no acute distress. Head:  Normocephalic and atraumatic. Eyes:  No icterus, sclera clear. Conjuctiva pink.  Mouth:  Without lesions, mucosa pink and moist.  Abdomen:  Bowel sounds present. Abdomen is distended, but not tense and non-tender. Some pitting edema in the lower abdomen. No rebound or guarding. Umbilical hernia present.   Msk:  Symmetrical without gross deformities. Normal posture. Extremities:  With 1-2 + edema up to knees. Neurologic:  Alert and  oriented x4;  grossly normal neurologically. Skin:  Warm and dry, intact without significant lesions.  Psych:  Normal mood and affect.  Intake/Output from previous day: 10/17 0701 - 10/18 0700 In: 1440 [P.O.:1440] Out: -  Intake/Output this shift: No intake/output data recorded.  Lab Results: Recent Labs    10/18/20 0532 10/19/20 0038 10/20/20 0655  WBC 4.5 4.3 4.7  HGB 8.2* 8.5* 8.2*  HCT 29.4* 30.1*  29.3*  PLT 107* 130* 108*   BMET Recent Labs    10/18/20 0532 10/19/20 0038 10/20/20 0656  NA 134* 134* 137  K 3.9 4.3 5.0  CL 103 103 105  CO2 25 25 27   GLUCOSE 99 162* 142*  BUN 52* 47* 47*  CREATININE 2.20* 2.22* 2.35*  CALCIUM 8.4* 8.5* 8.6*   LFT Recent Labs    10/18/20 0532 10/19/20 0038  PROT 6.0*  --   ALBUMIN 3.1* 3.3*  AST 17  --   ALT 16  --   ALKPHOS 60  --   BILITOT 0.8  --    PT/INR Recent Labs    10/19/20 0038  LABPROT 17.6*  INR 1.5*     Assessment: 70 year old male with history of a fib on chronic anticoagulation (Coumadin), cardiomyopathy, pulmonary hypertension and is on chronic oxygen, CKD, IDA, colon cancer s/p partial colectomy, cirrhosis, presenting with acute on chronic anemia, heme positive stools without overt GI bleeding.   Anemia: history of IDA with prior hospitalization at [facility] in June 2022 undergoing push enteroscopy without significant findings (chronic gastritis). Prior evaluation for IDA by Dr. Laural Golden in 2020 s/p EGD and colonoscopy (multiple tubular adenomas). Hgb down to 5.8 this admission, receiving 3 units total PRBCs. Hgb 8.5 yesterday, stable. Colonoscopy completed this admission with numerous polyps ranging 3-20 mm resected and retrieved, 5 hemostatic clips placed, but one clip fell off during maneuvering.  One with inflammatory appearance and intermittent oozing, clips placed for hemostasis.  Stated one of the polyps in the sigmoid had  inflammatory appearance and intermittent oozing which may have been source of anemia.  As patient had remained stable, plan was to consider agile capsule followed by capsule endoscopy outpatient if worsening anemia.  However, we were reconsulted today due to new onset bright red blood per rectum and passage of dark red clots x3 yesterday evening with last bowel movement around 9:30-10 PM.  Repeat hemoglobin this morning only down slightly to 8.2. We will hold off on repeat colonoscopy, liquids, and  repeat H&H at 3 PM.  Cirrhosis: likely secondary to NASH. Large AP diameter makes it difficult to assess if true ascites or body habitus, but patient reports abdominal distension and LE edema have improved since admission with diuretics. He did have [place] 10/12 with insufficient ascites for paracentesis. Doppler ultrasound with patent portal venous system. ANA, AMA, negative. ASMA weakly positive and IgG normal. Ceruloplasmin mildly elevated at 33.3. Negative acute hepatitis panel. Likely dealing with NASH. MELD 19 based on combination of labs over the last 2 days, primarily driven by kidney function which is fairly stable at 2.35 today, INR 1.5. No evidence of esophageal varices on enteroscopy done in June 2022. Will need follow-up care as outpatient. Contiue with Lasix 40 mg daily and spironolactone 50 mg daily (on hold today due to acute GI bleed last night). Will need close monitoring of renal function moving forward.    Plan: INR this morning, H/H at 3 pm.  Clear liquid diet.  Hold diuretics today, resume tomorrow.  Monitor for ongoing overt GI bleeding.  Transfuse if hemoglobin <8.  Colonoscopy if recurrent significant GI bleeding or declining hemoglobin.  Continue to hold Coumadin. Plans to resume on 10/22.  Awaiting pathology results from colonoscopy Oral iron at discharge Will need colonoscopy in 3 months for early interval surveillance Follow-up as outpatient with Dr. Jenetta Downer for ongoing cirrhosis care and colonoscopy.    LOS: 6 days    10/20/2020, 7:51 AM   Aliene Altes, PA-C [facility]

## 2020-10-20 NOTE — Progress Notes (Signed)
PROGRESS NOTE   William Flores  LYY:[id number] DOB: 26-Feb-1950 DOA: 10/14/2020 PCP: [name], [name], MD   Chief Complaint  Patient presents with   Gastroesophageal Reflux   Level of care: Telemetry  Brief Admission History:  [name] is a medically complex gentleman who presents with multiple complaints including increasing generalized weakness and debility, increasing abdominal distention, uncontrolled acid reflux symptoms, shortness of breath, inability to lie recumbent, DOE, increasing edema in lower extremities, occasional black stools, and worsening LUQ abd pain.  He is followed by the heart failure team in [place].  He has pulmonary hypertension and is on sildenafil TID. He was found to be anemic, hemoglobin at 7.1 on admission 10/12,  5.8 on 10/13, requiring 2x PRBC, now 7.7 10/14. Warfarin was stopped, vitamin K given, IV lasix was given for his abdominal edema and small volume ascites. IV protonix bid has been continued. Pulmonary edema and pleural effusions noted 10/13 on CXR, lasix 7m bid continued. Gi did colonoscopy 10/15 with findings of large rectal polyps with intermittent oozing from polyp, biopsies taken.    Assessment & Plan:   GI bleed /   Fecal occult blood test positive -anemic 5.8 normocytic, 2x PRBC ordered 10/13 -hemoglobin stable at 8.2 which is reassuring   -GI did colonoscopy 10/15 with findings of large rectal polyps with intermittent oozing from polyp, biopsies taken - GI recommends restarting warfarin in 1 week (10/24/20) - Rectal bleed overnight, discussed with GI team, clear liquids today, follow H/H.       Acute blood loss anemia -secondary to lower GI bleed, GI consulted, transfused, stable at this time - Hg remains reassuring at 8.2 per GI transfuse for Hg<8.   - recheck CBC daily to follow Hg     Normocytic anemia -10/12 hemoglobin of 7.1 -10/13, hemoglobin of 5.8 with two units of PRBC ordered 10/14 hgb 7.7, 10/15 9.4, 10/18- 8.2.   -GI consulted for GI bleed, colonoscopy planned -continue oral iron supplementation      Anticoagulated on Coumadin -discontinued warfarin 10/12 -5 mg vitamin K given 10/12 -7.5 mg vitamin k  10/13 -Initial INR 3.6 on 10/12,  10/13 3.2, 10/14 1.8 - per GI can restart warfarin in 1 week (10/24/20)   Acquired thrombophilia -platelets 162 10/12 -platelets 123 10/14 -restart warfarin on 10/22 per GI     Morbid obesity with BMI of 45.0-49.9, adult -outpatient lifestyle modifications -plausible obesity hypoventilation     Liver cirrhosis  -suggested on CT abdomen, GI team consulted -abdominal ultrasound shows cirrhosis with splenomegaly and perihepatic ascites. Findings are suggestive for portal hypertension. Portal venous system is patent with normal direction of flow.  -Low volume ascites insufficient for paracentesis - outpatient follow up with GI for cirrhosis care      Gout -no current complaints -continue allopurinol     Hypertensive heart disease -currently normotensive, significant RV dilation from pulmonary vascular hypertension -echocardiogram ordered -last echo 06/21/20 50/55% LVEF     History of colon cancer -previous colonoscopy with several polyps removed - frequent polyps seen on 10/15 - repeat colon survey recommended in 3 months      Right heart failure  -significant right ventricle dilation with evidence of pulmonary artery hypertension -BNP:  10/12-340, 10/13-273, 10/14-341     OSA (obstructive sleep apnea) -uses CPAP at home, order CPAP in hospital      Nonischemic cardiomyopathy -complete cardiac echo ordered - last echo 06/21/20 50/55% LVEF     Atrial fibrillation with slow ventricular response -  currently in Afib, rate in the 80's -was anticoagulated INR 3.6 -Stopped warfarin temporarily due to gi bleed, plan to restart around 10/24/20      DM (diabetes mellitus), type 2 with renal complications  -A1O of 8+ -SSI -carb modified diet  CBG  (last 3)  Recent Labs    10/19/20 2106 10/20/20 0719 10/20/20 1117  GLUCAP 134* 129* 148*      Essential hypertension -continue metoprolol -hold farxiga -hold torsemide -continue lasix daily  -GI team added spironolactone 50 mg daily  -monitoring electrolytes      MGUS (monoclonal gammopathy of unknown significance)  - outpatient follow up   CKD stage 3 due to type 2 diabetes mellitus  -Consider consulting nephrology -baseline creatinine 2-2.2,  2.68 10/14/20, 10/13 2.7, 10/14 2.49 -does not appear dehydrated and questionable if this is a progression of his CKD -monitoring renal function     Hyperlipidemia associated with type 2 diabetes mellitus  -continue atorvastatin     Pulmonary hypertension -continue sildenafil    Peripheral edema /  Bilateral leg edema -Lasix 29m daily and spironolactone 50 mg daily  -currently no concern for DVT as he was very recently supraterapeutic INR -monitoring electrolytes and renal function    Acute on chronic congestive heart failure  -echocardiogram ordered -Intake and output tracking -trending BNP Echocardiogram:  IMPRESSIONS   1. Left ventricular ejection fraction, by estimation, is 55 to 60%. The left ventricle has normal function. The left ventricle has no regional wall motion abnormalities. There is moderate concentric left ventricular  hypertrophy. Left ventricular diastolic function could not be evaluated. There is the interventricular  septum is flattened in systole and diastole, consistent with right ventricular pressure and volume overload.   2. Right ventricular systolic function is moderately reduced. The right ventricular size is severely enlarged.   3. Left atrial size was moderately dilated.   4. Right atrial size was severely dilated.   5. The mitral valve is degenerative. Mild mitral valve regurgitation. No evidence of mitral stenosis.   6. The aortic valve is tricuspid. There is moderate calcification of the  aortic valve. There is moderate thickening of the aortic valve. Aortic valve regurgitation is not visualized. Mild to moderate aortic valve  sclerosis/calcification is present, without any evidence of aortic stenosis.   DVT prophylaxis:  SCD Code Status: full Family Communication: none at bedside [facility]    Status is: Inpatient  Remains inpatient appropriate because: blood loss requiring transfusion and possibility for need to repeat, need repeat CBC done in AM, he remains on IV lasix at this time    Consultants:  GI  Procedures:  Liver [place] CXR [place] Colonoscopy planned 10/15  Antimicrobials:  -ceftriaxone 10/13- current, SBP prophylaxis per gi  Subjective: Pt had several bloody bowel movements during the night shift       Objective: Vitals:   10/19/20 2106 10/20/20 0605 10/20/20 0847 10/20/20 1224  BP: 116/63 100/69 105/85 116/69  Pulse: 85 80 82 81  Resp: 20 18  20   Temp: 97.9 F (36.6 C) 97.9 F (36.6 C)  (!) 97.1 F (36.2 C)  TempSrc: Oral Oral  Axillary  SpO2: 99% 98%  98%  Weight:  124.6 kg    Height:        Intake/Output Summary (Last 24 hours) at 10/20/2020 1236 Last data filed at 10/20/2020 0900 Gross per 24 hour  Intake 1200 ml  Output --  Net 1200 ml  Filed Weights   10/18/20 0504 10/19/20 0534 10/20/20 0605  Weight: 123.9 kg 124.6 kg 124.6 kg    Examination:  General exam: up in chair, awake, alert, cooperative, Appears chronically ill.    Respiratory system: Clear to auscultation. Respiratory effort normal, limited inspiration due to abdominal size   Cardiovascular system: normal S1 & S2 heard. No JVD, murmurs, rubs, gallops or clicks. Some pedal edema. Decreased pedal pulses bilaterally, less than 2 seconds capillary refill in his feel bilaterally   Gastrointestinal system: Abdomen is large, markedly reduced abd edema from initial presentation, mild LUQ tenderness, non  tympanic, with present bowel sounds   Central nervous system: Alert and oriented x3. No focal neurological deficits.   Extremities: moves all extremities appropriately, reports improved foot apin, reports worsened when his hemoglobin began to decline, decreased pedal pulses with normal cap refill in his feet   Skin: excoriations, no overt bruising or rashes   Psychiatry: Judgement and insight appear normal. Mood & affect appropriate.   Data Reviewed: I have personally reviewed following labs and imaging studies  CBC: Recent Labs  Lab 10/14/20 1102 10/15/20 0538 10/17/20 0428 10/17/20 1135 10/18/20 0532 10/19/20 0038 10/20/20 0655  WBC 6.0   < > 4.7 5.4 4.5 4.3 4.7  NEUTROABS 4.4  --   --   --   --   --   --   HGB 7.1*   < > 8.7* 9.4* 8.2* 8.5* 8.2*  HCT 25.8*   < > 30.6* 33.1* 29.4* 30.1* 29.3*  MCV 89.3   < > 89.7 91.9 90.5 92.3 93.9  PLT 162   < > 104* 116* 107* 130* 108*   < > = values in this interval not displayed.    Basic Metabolic Panel: Recent Labs  Lab 10/16/20 0529 10/17/20 0428 10/18/20 0532 10/19/20 0038 10/20/20 0656  NA 137 136 134* 134* 137  K 3.8 3.9 3.9 4.3 5.0  CL 106 104 103 103 105  CO2 24 26 25 25 27   GLUCOSE 108* 97 99 162* 142*  BUN 68* 60* 52* 47* 47*  CREATININE 2.49* 2.32* 2.20* 2.22* 2.35*  CALCIUM 8.3* 8.2* 8.4* 8.5* 8.6*  MG 2.2  --   --   --   --     GFR: Estimated Creatinine Clearance: 36.3 mL/min (A) (by C-G formula based on SCr of 2.35 mg/dL (H)).  Liver Function Tests: Recent Labs  Lab 10/14/20 1102 10/17/20 0428 10/18/20 0532 10/19/20 0038  AST 34 17 17  --   ALT 31 20 16   --   ALKPHOS 75 58 60  --   BILITOT 0.9 0.9 0.8  --   PROT 7.0 6.3* 6.0*  --   ALBUMIN 3.6 3.2* 3.1* 3.3*    CBG: Recent Labs  Lab 10/19/20 1103 10/19/20 1603 10/19/20 2106 10/20/20 0719 10/20/20 1117  GLUCAP 157* 157* 134* 129* 148*    Recent Results (from the past 240 hour(s))  Resp Panel by RT-PCR (Flu A&B, Covid) Nasopharyngeal  Swab     Status: None   Collection Time: 10/14/20  2:15 PM   Specimen: Nasopharyngeal Swab; Nasopharyngeal(NP) swabs in vial transport medium  Result Value Ref Range Status   SARS Coronavirus 2 by RT PCR NEGATIVE NEGATIVE Final    Comment: (NOTE) SARS-CoV-2 target nucleic acids are NOT DETECTED.  The SARS-CoV-2 RNA is generally detectable in upper respiratory specimens during the acute phase of infection. The lowest concentration of SARS-CoV-2 viral copies this assay can detect is 138 copies/mL.  A negative result does not preclude SARS-Cov-2 infection and should not be used as the sole basis for treatment or other patient management decisions. A negative result may occur with  improper specimen collection/handling, submission of specimen other than nasopharyngeal swab, presence of viral mutation(s) within the areas targeted by this assay, and inadequate number of viral copies(<138 copies/mL). A negative result must be combined with clinical observations, patient history, and epidemiological information. The expected result is Negative.  Fact Sheet for Patients:  EntrepreneurPulse.com.au  Fact Sheet for Healthcare Providers:  IncredibleEmployment.be  This test is no t yet approved or cleared by the [place] FDA and  has been authorized for detection and/or diagnosis of SARS-CoV-2 by FDA under an Emergency Use Authorization (EUA). This EUA will remain  in effect (meaning this test can be used) for the duration of the COVID-19 declaration under Section 564(b)(1) of the Act, 21 U.S.C.section 360bbb-3(b)(1), unless the authorization is terminated  or revoked sooner.       Influenza A by PCR NEGATIVE NEGATIVE Final   Influenza B by PCR NEGATIVE NEGATIVE Final    Comment: (NOTE) The Xpert Xpress SARS-CoV-2/FLU/RSV plus assay is intended as an aid in the diagnosis of influenza from Nasopharyngeal swab specimens and should not be used as a sole  basis for treatment. Nasal washings and aspirates are unacceptable for Xpert Xpress SARS-CoV-2/FLU/RSV testing.  Fact Sheet for Patients: EntrepreneurPulse.com.au  Fact Sheet for Healthcare Providers: IncredibleEmployment.be  This test is not yet approved or cleared by the [place] FDA and has been authorized for detection and/or diagnosis of SARS-CoV-2 by FDA under an Emergency Use Authorization (EUA). This EUA will remain in effect (meaning this test can be used) for the duration of the COVID-19 declaration under Section 564(b)(1) of the Act, 21 U.S.C. section 360bbb-3(b)(1), unless the authorization is terminated or revoked.  Performed at [facility], [address] [place], [place] [address]      Radiology Studies: No results found.  Scheduled Meds:  sodium chloride   Intravenous Once   allopurinol  300 mg Oral Daily   atorvastatin  20 mg Oral QPM   ferrous sulfate  325 mg Oral Q breakfast   furosemide  40 mg Oral Daily   insulin aspart  0-9 Units Subcutaneous TID WC   insulin glargine-yfgn  10 Units Subcutaneous Q2200   metoprolol succinate  75 mg Oral Daily   pantoprazole  40 mg Oral BID AC   potassium chloride SA  10 mEq Oral Daily   sildenafil  20 mg Oral TID   spironolactone  50 mg Oral Daily   Continuous Infusions:  cefTRIAXone (ROCEPHIN)  IV 2 g (10/19/20 1411)    LOS: 6 days   Irwin Brakeman, MD  How to contact the [facility] Attending or Consulting provider [place] or covering provider during after hours [place], for this patient?  Check the care team in [facility] and look for a) attending/consulting TRH provider listed and b) the [facility] team listed Log into [email or web address] and use [place]'s universal password to access. If you do not have the password, please contact the hospital operator. Locate the [facility] provider you are looking for under [facility] and page to a number that you can be directly reached. If you still  have difficulty reaching the provider, please page the [facility] (Director on Call) for the Hospitalists listed on amion for assistance.  10/20/2020, 12:36 PM

## 2020-10-20 NOTE — Telephone Encounter (Signed)
Noted  

## 2020-10-20 NOTE — Progress Notes (Signed)
10/20/2020 7:33 AM  RN notified me he is having bloody BMs that appear to be fresh blood with clots.  We notified GI team.  He is made NPO.  Type and cross and transfuse 1 unit PRBC while waiting for STAT CBC.  Keep 2 units PRBC ahead in case needed.  Vitals ok right now.  Follow up labs.    Vitals:   10/19/20 2106 10/20/20 0605  BP: 116/63 100/69  Pulse: 85 80  Resp: 20 18  Temp: 97.9 F (36.6 C) 97.9 F (36.6 C)  SpO2: 99% 98%   Murvin Natal MD

## 2020-10-20 NOTE — Telephone Encounter (Signed)
I called and left a detailed message asked that the patient please return call to the office.

## 2020-10-20 NOTE — Progress Notes (Signed)
Pt has had two bloody stools since 7pm

## 2020-10-20 NOTE — Telephone Encounter (Signed)
Patient is still hospitalized, may need to get a call once discharged, will keep you posted

## 2020-10-21 DIAGNOSIS — Z85038 Personal history of other malignant neoplasm of large intestine: Secondary | ICD-10-CM

## 2020-10-21 LAB — GLUCOSE, CAPILLARY
Glucose-Capillary: 95 mg/dL (ref 70–99)
Glucose-Capillary: 89 mg/dL (ref 70–99)
Glucose-Capillary: 108 mg/dL — ABNORMAL HIGH (ref 70–99)
Glucose-Capillary: 157 mg/dL — ABNORMAL HIGH (ref 70–99)

## 2020-10-21 LAB — PREPARE FRESH FROZEN PLASMA

## 2020-10-21 LAB — COMPREHENSIVE METABOLIC PANEL
ALT: 12 U/L (ref 0–44)
AST: 15 U/L (ref 15–41)
Albumin: 3.1 g/dL — ABNORMAL LOW (ref 3.5–5.0)
Alkaline Phosphatase: 55 U/L (ref 38–126)
Anion gap: 6 (ref 5–15)
BUN: 44 mg/dL — ABNORMAL HIGH (ref 8–23)
CO2: 25 mmol/L (ref 22–32)
Calcium: 8.4 mg/dL — ABNORMAL LOW (ref 8.9–10.3)
Chloride: 105 mmol/L (ref 98–111)
Creatinine, Ser: 1.86 mg/dL — ABNORMAL HIGH (ref 0.61–1.24)
GFR, Estimated: 39 mL/min — ABNORMAL LOW (ref >60)
Glucose, Bld: 103 mg/dL — ABNORMAL HIGH (ref 70–99)
Potassium: 4.4 mmol/L (ref 3.5–5.1)
Sodium: 136 mmol/L (ref 135–145)
Total Bilirubin: 1 mg/dL (ref 0.3–1.2)
Total Protein: 6 g/dL — ABNORMAL LOW (ref 6.5–8.1)

## 2020-10-21 LAB — CBC
HCT: 29.4 % — ABNORMAL LOW (ref 39.0–52.0)
Hemoglobin: 8.5 g/dL — ABNORMAL LOW (ref 13.0–17.0)
MCH: 26.9 pg (ref 26.0–34.0)
MCHC: 28.9 g/dL — ABNORMAL LOW (ref 30.0–36.0)
MCV: 93 fL (ref 80.0–100.0)
Platelets: 93 10*3/uL — ABNORMAL LOW (ref 150–400)
RBC: 3.16 MIL/uL — ABNORMAL LOW (ref 4.22–5.81)
RDW: 18.4 % — ABNORMAL HIGH (ref 11.5–15.5)
WBC: 4.3 10*3/uL (ref 4.0–10.5)
nRBC: 0.5 % — ABNORMAL HIGH (ref 0.0–0.2)

## 2020-10-21 LAB — BPAM FFP
Blood Product Expiration Date: 202210232359
Blood Product Expiration Date: 202210232359
ISSUE DATE / TIME: 202210181833
ISSUE DATE / TIME: 202210182056
Unit Type and Rh: 5100
Unit Type and Rh: 9500

## 2020-10-21 LAB — PROTIME-INR
INR: 1.5 — ABNORMAL HIGH (ref 0.8–1.2)
Prothrombin Time: 17.7 seconds — ABNORMAL HIGH (ref 11.4–15.2)

## 2020-10-21 LAB — MAGNESIUM: Magnesium: 2.4 mg/dL (ref 1.7–2.4)

## 2020-10-21 MED ORDER — TORSEMIDE 20 MG PO TABS
60.0000 mg | ORAL_TABLET | Freq: Two times a day (BID) | ORAL | Status: DC
  Administered 2020-10-21 – 2020-10-23 (×4): 60 mg via ORAL
  Filled 2020-10-21 (×4): qty 3

## 2020-10-21 MED ORDER — INSULIN GLARGINE-YFGN 100 UNIT/ML ~~LOC~~ SOLN
7.0000 [IU] | Freq: Every day | SUBCUTANEOUS | Status: DC
  Administered 2020-10-21 – 2020-10-23 (×3): 7 [IU] via SUBCUTANEOUS
  Filled 2020-10-21 (×5): qty 0.07

## 2020-10-21 MED ORDER — METOPROLOL SUCCINATE ER 25 MG PO TB24
25.0000 mg | ORAL_TABLET | Freq: Every day | ORAL | Status: DC
  Administered 2020-10-22 – 2020-10-24 (×3): 25 mg via ORAL
  Filled 2020-10-21 (×3): qty 1

## 2020-10-21 NOTE — Progress Notes (Signed)
Subjective: Patient states he is feeling tired today but denies lightheadedness or syncope, reports he had two BMs without hematochezia last night and one this am without bleeding. He denies any abdominal pain. He denies nausea or vomiting. States he is hungry and is ready to be able to eat.  Denies mental status changes, feels that swelling to abdomen and lower extremities is about the same as it has been for the past few days.  Objective: Vital signs in last 24 hours: Temp:  [97.1 F (36.2 C)-99 F (37.2 C)] 98 F (36.7 C) (10/19 0455) Pulse Rate:  [77-99] 86 (10/19 0455) Resp:  [18-20] 18 (10/19 0455) BP: (101-151)/(64-126) 121/77 (10/19 0455) SpO2:  [95 %-100 %] 97 % (10/19 0455) Weight:  [124.9 kg] 124.9 kg (10/19 0500) Last BM Date: 10/19/20 General:   Alert and oriented, pleasant Head:  Normocephalic and atraumatic. Eyes:  No icterus, sclera clear. Conjuctiva pink.  Mouth:  Without lesions, mucosa pink and moist.  Heart:  S1, S2 present, no murmurs noted.  Lungs: Clear to auscultation bilaterally, without wheezing, rales, or rhonchi.  Abdomen:  Bowel sounds present, soft, non-tender. Abdomen mildly distended but soft. No HSM or hernias noted. No rebound or guarding. No masses appreciated  Msk:  Symmetrical without gross deformities. Normal posture. Pulses:  Normal pulses noted. Extremities:  Without clubbing, mild non pitting edema. Neurologic:  Alert and  oriented x4;  grossly normal neurologically. Skin:  Warm and dry, intact without significant lesions.  Psych:  Alert and cooperative. Normal mood and affect.  Intake/Output from previous day: 10/18 0701 - 10/19 0700 In: 1357.7 [P.O.:840; I.V.:120; Blood:397.7] Out: -  Intake/Output this shift: No intake/output data recorded.  Lab Results: Recent Labs    10/19/20 0038 10/20/20 0655 10/20/20 1614 10/21/20 0628  WBC 4.3 4.7  --  4.3  HGB 8.5* 8.2* 7.7* 8.5*  HCT 30.1* 29.3* 27.6* 29.4*  PLT 130* 108*  --  93*    BMET Recent Labs    10/19/20 0038 10/20/20 0656 10/21/20 0628  NA 134* 137 136  K 4.3 5.0 4.4  CL 103 105 105  CO2 25 27 25   GLUCOSE 162* 142* 103*  BUN 47* 47* 44*  CREATININE 2.22* 2.35* 1.86*  CALCIUM 8.5* 8.6* 8.4*   LFT Recent Labs    10/19/20 0038 10/21/20 0628  PROT  --  6.0*  ALBUMIN 3.3* 3.1*  AST  --  15  ALT  --  12  ALKPHOS  --  55  BILITOT  --  1.0   PT/INR Recent Labs    10/20/20 0815 10/21/20 0628  LABPROT 17.6* 17.7*  INR 1.5* 1.5*    Assessment: 70 year old male with history of a fib on chronic anticoagulation (coumadin), cardiomyopathy, pulmonary htn, and on chronic supplemental O2, CKD, IDA, colon cancer s/p partial colectomy, cirrhosis, presenting with acute on chronic anemia, heme positive stool, without overt GI bleeding.   Anemia: history of IDA, underwent push enteroscopy w/o significant findings other than chronic gastritis in June 2022 at [facility]. Prior evaluation of IDA by Dr. Laural Golden in 2020 s/p EGD and colonoscopy with tubular adenomas. Hgb this admission down to 5.8. Colonoscopy completed on 10/15 with n umersou polyps ranging from 3-53m resected and retrieved, 5 hemostatic clips placed, but one clip fell of during maneuvering. One with inflammatory appearance and intermittent oozing, clips placed for hemostasis, suspected this could have been source of anemia. Patient had remained stable s/p colonoscopy, however, Monday he developed overt rectal  bleeding with BRBPR with dark red clots x3, and 3 more bloody BMs yesterday, with hgb decline to 7.7 yesterday afternoon,  he received 1 unit PRBCs and 2 unit of FFP last night, for a total of 4 units PRBCs this admission. Repeat colonoscopy during admission was considered, however, patient reports no rectal bleeding with either of his 2 BMs last night or with the one BM he had this morning. Hgb 8.5 this morning, INR remains at 1.5. At this time we will hold off on repeating colonoscopy  inpatient, continue to trend H&H, monitor for further overt GI bleeding and plan for repeat outpatient colonoscopy in 3 months unless rectal bleeding recurs.  Cirrhosis:likely secondary to NASH. Difficult to assess extent of abdominal swelling due to body habitus, however, patient reports some improvement of swelling since admission with diuretics, states that swelling appears to be about the same today as it has been for the past few days. [place] on 10/14/20 with minimal ascites, not enough for paracentesis. Doppler US showed patent portal system. ANA, AMA negative. ASMA positive but not grossly elevated, IgG normal. Ceruloplasmin mildly elevated at 33.3. Negative acute hepatitis panel. No evidence of esophageal varices on EGD done June 2022, will need GI follow up care as outpatient for management of cirrhosis. Continue with lasix 32m daily and aldactone 565mdaily. MELD today is 18, though driven up by kidney function, creatnine has improved some today. LFTs remain WNL. No signs of HE, patient a/ox4.   Plan: Continue to trend H&H, INR Trend LFTs Advance diet to clear liquid, consider advancing to regular diet this evening if tolerating liquids and no further rectal bleeding occurs Monitor for overt bleeding Transfuse if Hgb <8 Continue to hold coumadin, plans to resume 10/22 Oral iron at D/C Colonoscopy repeat in 3 months F/u outpatient with Dr. CaJenetta Downeror ongoing cirrhosis management and colonoscopy   LOS: 7 days    10/21/2020, 9:00 AM   Frankye Schwegel L. CaAlver SorrowMSN, APRN, AGNP-C Adult-Gerontology Nurse Practitioner [facility]

## 2020-10-21 NOTE — Progress Notes (Signed)
PROGRESS NOTE  [name] KGM:[id number] DOB: 10-18-1950 DOA: 10/14/2020 PCP: [name], [name], MD  Brief History:  [name] is a medically complex gentleman who presents with multiple complaints including increasing generalized weakness and debility, increasing abdominal distention, uncontrolled acid reflux symptoms, shortness of breath, inability to lie recumbent, DOE, increasing edema in lower extremities, occasional black stools, and worsening LUQ abd pain.  He is followed by the heart failure team in [place].  He has pulmonary hypertension and is on sildenafil TID. He was found to be anemic, hemoglobin at 7.1 on admission 10/12,  5.8 on 10/13, requiring 2x PRBC, now 7.7 10/14. Warfarin was stopped, vitamin K given, IV lasix was given for his abdominal edema and small volume ascites. IV protonix bid has been continued. Pulmonary edema and pleural effusions noted 10/13 on CXR, lasix 95m bid continued. Gi did colonoscopy 10/15 with findings of large rectal polyps with intermittent oozing from polyp, biopsies taken.    Assessment/Plan: GI bleed /   Hematochezia -anemic 5.8 normocytic, 2x PRBC ordered 10/13 -hemoglobin stable at 8.2 which is reassuring   -GI did colonoscopy 10/15 with findings of large rectal polyps with intermittent oozing from polyp, biopsies taken - GI recommends restarting warfarin in 1 week (10/24/20) - 10/18--one additional unit PRBC (3 total for admit) and 2 FFP     Acute blood loss anemia -secondary to lower GI bleed, GI consulted, transfused, stable at this time - Hg remains reassuring at 8.2 per GI transfuse for Hg<8.   - recheck CBC daily to follow Hg  -baseline Hgb~9     Anticoagulated on Coumadin -discontinued warfarin 10/12 -5 mg vitamin K given 10/12 -7.5 mg vitamin k  10/13 -Initial INR 3.6 on 10/12,  10/13 3.2, 10/14 1.8 - per GI can restart warfarin in 1 week (10/24/20) -plan for INR lower 2's   Thrombocytopenia -due to  liver cirrhosis -monitor for signs of bleeding -restart warfarin on 10/22 per GI     Morbid obesity with BMI of 45.0-49.9, adult -outpatient lifestyle modifications -plausible obesity hypoventilation     Liver cirrhosis--decompensated -10/14/20 CT abd--nodular liver; Small volume abdominal ascites. Diffuse soft tissue anasarca -10/14/20 abd ultrasound shows cirrhosis with splenomegaly and perihepatic ascites. Findings are suggestive for portal hypertension. Portal venous system is patent with normal direction of flow.  -Low volume ascites insufficient for paracentesis - outpatient follow up with GI for cirrhosis care      Gout -no current complaints -continue allopurinol     History of colon cancer -previous colonoscopy with several polyps removed - frequent polyps seen on 10/15 - repeat colon survey recommended in 3 months    Cor Pulmonale/Acute on chronic diastolic CHF -significant right ventricle dilation with evidence of pulmonary artery hypertension -10/16/20 Echo EF 55-60%, no WMA, IV septum flattened; severe enlarged RV -piro 50 mg daily -restart sildenafil 20 mg tid -plan to restart torsemide 60 mg bid  CKD  3b -baseline creatinine 1.8-2.2 -monitor with diuresis     OSA (obstructive sleep apnea) -uses CPAP at home, order CPAP in hospital   Chronic Respiratory Failure with Hypoxia -chronically on 2L at home     Chronic Atrial fibrillation with slow ventricular response -currently in Afib, rate in the 80's -was anticoagulated INR 3.6 -Stopped warfarin temporarily due to gi bleed, plan to restart around 10/24/20  -restart low dose metoprolol     DM (diabetes mellitus), type 2 with renal complications  164/40/34  A1C--7.1 -continue novolog sliding scale -decrease semglee to 7     MGUS (monoclonal gammopathy of unknown significance)  - outpatient follow up  -Previously followed by hem/onc but has not seen in several years     Hyperlipidemia associated with  type 2 diabetes mellitus  -continue atorvastatin     Pulmonary hypertension -continue sildenafil     Status is: Inpatient  Remains inpatient appropriate because: severity of illness requiring PRBC and FFP transfusions        Family Communication:  no Family at bedside  Consultants:  GI  Code Status:  FULL   DVT Prophylaxis:  warfarin on hold   Procedures: As Listed in Progress Note Above  Antibiotics: None      Subjective: Patient denies fevers, chills, headache, chest pain, dyspnea, nausea, vomiting, diarrhea, abdominal pain, dysuria, hematuria, hematochezia, and melena. No hematochezia today with 2 BMs  Objective: Vitals:   10/21/20 0455 10/21/20 0500 10/21/20 1000 10/21/20 1252  BP: 121/77  115/75 127/73  Pulse: 86  82 94  Resp: 18  18 18   Temp: 98 F (36.7 C)   (!) 97.5 F (36.4 C)  TempSrc:    Oral  SpO2: 97%   96%  Weight:  124.9 kg    Height:        Intake/Output Summary (Last 24 hours) at 10/21/2020 1803 Last data filed at 10/21/2020 1758 Gross per 24 hour  Intake 1117.67 ml  Output --  Net 1117.67 ml   Weight change: 0.251 kg Exam:  General:  Pt is alert, follows commands appropriately, not in acute distress HEENT: No icterus, No thrush, No neck mass, [place]/AT Cardiovascular: RRR, S1/S2, no rubs, no gallops Respiratory: bibasilar crackles. No wheeze Abdomen: Soft/+BS, non tender, non distended, no guarding Extremities: 2 + LE edema, No lymphangitis, No petechiae, No rashes, no synovitis   Data Reviewed: I have personally reviewed following labs and imaging studies Basic Metabolic Panel: Recent Labs  Lab 10/16/20 0529 10/17/20 0428 10/18/20 0532 10/19/20 0038 10/20/20 0656 10/21/20 0628  NA 137 136 134* 134* 137 136  K 3.8 3.9 3.9 4.3 5.0 4.4  CL 106 104 103 103 105 105  CO2 24 26 25 25 27 25   GLUCOSE 108* 97 99 162* 142* 103*  BUN 68* 60* 52* 47* 47* 44*  CREATININE 2.49* 2.32* 2.20* 2.22* 2.35* 1.86*  CALCIUM 8.3*  8.2* 8.4* 8.5* 8.6* 8.4*  MG 2.2  --   --   --   --  2.4   Liver Function Tests: Recent Labs  Lab 10/17/20 0428 10/18/20 0532 10/19/20 0038 10/21/20 0628  AST 17 17  --  15  ALT 20 16  --  12  ALKPHOS 58 60  --  55  BILITOT 0.9 0.8  --  1.0  PROT 6.3* 6.0*  --  6.0*  ALBUMIN 3.2* 3.1* 3.3* 3.1*   No results for input(s): LIPASE, AMYLASE in the last 168 hours. Recent Labs  Lab 10/16/20 0529  AMMONIA 49*   Coagulation Profile: Recent Labs  Lab 10/16/20 0529 10/17/20 0428 10/19/20 0038 10/20/20 0815 10/21/20 0628  INR 1.8* 1.4* 1.5* 1.5* 1.5*   CBC: Recent Labs  Lab 10/17/20 1135 10/18/20 0532 10/19/20 0038 10/20/20 0655 10/20/20 1614 10/21/20 0628  WBC 5.4 4.5 4.3 4.7  --  4.3  HGB 9.4* 8.2* 8.5* 8.2* 7.7* 8.5*  HCT 33.1* 29.4* 30.1* 29.3* 27.6* 29.4*  MCV 91.9 90.5 92.3 93.9  --  93.0  PLT 116* 107* 130* 108*  --  93*   Cardiac Enzymes: No results for input(s): CKTOTAL, CKMB, CKMBINDEX, TROPONINI in the last 168 hours. BNP: Invalid input(s): POCBNP CBG: Recent Labs  Lab 10/20/20 1624 10/20/20 2012 10/21/20 0740 10/21/20 1127 10/21/20 1649  GLUCAP 127* 130* 95 89 108*   HbA1C: No results for input(s): HGBA1C in the last 72 hours. Urine analysis:    Component Value Date/Time   COLORURINE YELLOW 06/21/2020 1059   APPEARANCEUR CLEAR 06/21/2020 1059   LABSPEC 1.020 06/21/2020 1059   PHURINE 5.5 06/21/2020 1059   GLUCOSEU NEGATIVE 06/21/2020 1059   HGBUR LARGE (A) 06/21/2020 1059   BILIRUBINUR NEGATIVE 06/21/2020 1059   KETONESUR NEGATIVE 06/21/2020 1059   PROTEINUR 100 (A) 06/21/2020 1059   UROBILINOGEN 0.2 05/16/2014 0822   NITRITE NEGATIVE 06/21/2020 1059   LEUKOCYTESUR NEGATIVE 06/21/2020 1059   Sepsis Labs: @LABRCNTIP (procalcitonin:4,lacticidven:4) ) Recent Results (from the past 240 hour(s))  Resp Panel by RT-PCR (Flu A&B, Covid) Nasopharyngeal Swab     Status: None   Collection Time: 10/14/20  2:15 PM   Specimen: Nasopharyngeal  Swab; Nasopharyngeal(NP) swabs in vial transport medium  Result Value Ref Range Status   SARS Coronavirus 2 by RT PCR NEGATIVE NEGATIVE Final    Comment: (NOTE) SARS-CoV-2 target nucleic acids are NOT DETECTED.  The SARS-CoV-2 RNA is generally detectable in upper respiratory specimens during the acute phase of infection. The lowest concentration of SARS-CoV-2 viral copies this assay can detect is 138 copies/mL. A negative result does not preclude SARS-Cov-2 infection and should not be used as the sole basis for treatment or other patient management decisions. A negative result may occur with  improper specimen collection/handling, submission of specimen other than nasopharyngeal swab, presence of viral mutation(s) within the areas targeted by this assay, and inadequate number of viral copies(<138 copies/mL). A negative result must be combined with clinical observations, patient history, and epidemiological information. The expected result is Negative.  Fact Sheet for Patients:  EntrepreneurPulse.com.au  Fact Sheet for Healthcare Providers:  IncredibleEmployment.be  This test is no t yet approved or cleared by the [place] FDA and  has been authorized for detection and/or diagnosis of SARS-CoV-2 by FDA under an Emergency Use Authorization (EUA). This EUA will remain  in effect (meaning this test can be used) for the duration of the COVID-19 declaration under Section 564(b)(1) of the Act, 21 U.S.C.section 360bbb-3(b)(1), unless the authorization is terminated  or revoked sooner.       Influenza A by PCR NEGATIVE NEGATIVE Final   Influenza B by PCR NEGATIVE NEGATIVE Final    Comment: (NOTE) The Xpert Xpress SARS-CoV-2/FLU/RSV plus assay is intended as an aid in the diagnosis of influenza from Nasopharyngeal swab specimens and should not be used as a sole basis for treatment. Nasal washings and aspirates are unacceptable for Xpert Xpress  SARS-CoV-2/FLU/RSV testing.  Fact Sheet for Patients: EntrepreneurPulse.com.au  Fact Sheet for Healthcare Providers: IncredibleEmployment.be  This test is not yet approved or cleared by the [place] FDA and has been authorized for detection and/or diagnosis of SARS-CoV-2 by FDA under an Emergency Use Authorization (EUA). This EUA will remain in effect (meaning this test can be used) for the duration of the COVID-19 declaration under Section 564(b)(1) of the Act, 21 U.S.C. section 360bbb-3(b)(1), unless the authorization is terminated or revoked.  Performed at [facility], [address], [place], [place] [address]      Scheduled Meds:  sodium chloride   Intravenous Once   sodium chloride   Intravenous Once  allopurinol  300 mg Oral Daily   atorvastatin  20 mg Oral QPM   ferrous sulfate  325 mg Oral Q breakfast   furosemide  40 mg Oral Daily   insulin aspart  0-9 Units Subcutaneous TID WC   insulin glargine-yfgn  10 Units Subcutaneous Q2200   pantoprazole  40 mg Oral BID AC   sildenafil  20 mg Oral TID   spironolactone  50 mg Oral Daily   Continuous Infusions:  Procedures/Studies: CT Abdomen Pelvis Wo Contrast  Result Date: 10/14/2020 CLINICAL DATA:  Evaluate for infection EXAM: CT CHEST, ABDOMEN AND PELVIS WITHOUT CONTRAST TECHNIQUE: Multidetector CT imaging of the chest, abdomen and pelvis was performed following the standard protocol without IV contrast. COMPARISON:  CT abdomen and pelvis dated December 26, 2018 FINDINGS: CT CHEST FINDINGS Cardiovascular: Cardiomegaly. No pericardial effusion. Three-vessel coronary artery calcifications. Mitral annular calcifications. Atherosclerotic disease of the thoracic aorta. Mediastinum/Nodes: Mildly enlarged mediastinal lymph nodes which are likely reactive. Reference right pulmonary ligament lymph node measuring 1.4 cm in short axis on series 2, image 31. Esophagus and thyroid are  unremarkable. Lungs/Pleura: Central airways are patent. Small bilateral pleural effusions and atelectasis. Ground-glass opacities of the bilateral upper lungs. Solid pulmonary nodule of the right lower lobe measuring 3 mm in mean diameter on series 3, image 78. Musculoskeletal: No chest wall mass or suspicious bone lesions identified. CT ABDOMEN PELVIS FINDINGS Hepatobiliary: Nodular liver contour. Gallbladder is unremarkable. No biliary ductal dilation. Pancreas: Mild peripancreatic fat stranding, likely due to volume status given normal serum lipase. Otherwise unremarkable. Spleen: Unremarkable. Adrenals/Urinary Tract: Bilateral adrenal glands are unremarkable. Cortical thinning of the bilateral kidneys. No evidence of hydronephrosis or nephrolithiasis. Distended urinary bladder. Stomach/Bowel: Stomach is within normal limits. Prior right hemi colectomy. No evidence of bowel wall thickening, distention, or inflammatory changes. Vascular/Lymphatic: Aortic atherosclerosis. No enlarged abdominal or pelvic lymph nodes. Reproductive: Prostate is unremarkable. Other: Small volume abdominal ascites. Diffuse soft tissue anasarca. Musculoskeletal: No acute or significant osseous findings. IMPRESSION: Mild bilateral ground-glass opacities which are most prominent in the upper lungs, favor pulmonary edema, although an infectious process could appear similar. Small bilateral pleural effusions, small volume abdominal ascites, and soft tissue anasarca, likely due to volume status. Nodular liver contour, suggestive of cirrhosis. Solid pulmonary nodule the right lower lobe measuring 3 mm. No follow-up needed if patient is low-risk. Non-contrast chest CT can be considered in 12 months if patient is high-risk. This recommendation follows the consensus statement: Guidelines for Management of Incidental Pulmonary Nodules Detected on CT Images: From the Fleischner Society 2017; Radiology 2017; 284:228-243. Aortic Atherosclerosis  (ICD10-I70.0). Electronically Signed   By: Yetta Glassman M.D.   On: 10/14/2020 13:11   CT Chest Wo Contrast  Result Date: 10/14/2020 CLINICAL DATA:  Evaluate for infection EXAM: CT CHEST, ABDOMEN AND PELVIS WITHOUT CONTRAST TECHNIQUE: Multidetector CT imaging of the chest, abdomen and pelvis was performed following the standard protocol without IV contrast. COMPARISON:  CT abdomen and pelvis dated December 26, 2018 FINDINGS: CT CHEST FINDINGS Cardiovascular: Cardiomegaly. No pericardial effusion. Three-vessel coronary artery calcifications. Mitral annular calcifications. Atherosclerotic disease of the thoracic aorta. Mediastinum/Nodes: Mildly enlarged mediastinal lymph nodes which are likely reactive. Reference right pulmonary ligament lymph node measuring 1.4 cm in short axis on series 2, image 31. Esophagus and thyroid are unremarkable. Lungs/Pleura: Central airways are patent. Small bilateral pleural effusions and atelectasis. Ground-glass opacities of the bilateral upper lungs. Solid pulmonary nodule of the right lower lobe measuring 3 mm in mean diameter on  series 3, image 78. Musculoskeletal: No chest wall mass or suspicious bone lesions identified. CT ABDOMEN PELVIS FINDINGS Hepatobiliary: Nodular liver contour. Gallbladder is unremarkable. No biliary ductal dilation. Pancreas: Mild peripancreatic fat stranding, likely due to volume status given normal serum lipase. Otherwise unremarkable. Spleen: Unremarkable. Adrenals/Urinary Tract: Bilateral adrenal glands are unremarkable. Cortical thinning of the bilateral kidneys. No evidence of hydronephrosis or nephrolithiasis. Distended urinary bladder. Stomach/Bowel: Stomach is within normal limits. Prior right hemi colectomy. No evidence of bowel wall thickening, distention, or inflammatory changes. Vascular/Lymphatic: Aortic atherosclerosis. No enlarged abdominal or pelvic lymph nodes. Reproductive: Prostate is unremarkable. Other: Small volume abdominal  ascites. Diffuse soft tissue anasarca. Musculoskeletal: No acute or significant osseous findings. IMPRESSION: Mild bilateral ground-glass opacities which are most prominent in the upper lungs, favor pulmonary edema, although an infectious process could appear similar. Small bilateral pleural effusions, small volume abdominal ascites, and soft tissue anasarca, likely due to volume status. Nodular liver contour, suggestive of cirrhosis. Solid pulmonary nodule the right lower lobe measuring 3 mm. No follow-up needed if patient is low-risk. Non-contrast chest CT can be considered in 12 months if patient is high-risk. This recommendation follows the consensus statement: Guidelines for Management of Incidental Pulmonary Nodules Detected on CT Images: From the Fleischner Society 2017; Radiology 2017; 284:228-243. Aortic Atherosclerosis (ICD10-I70.0). Electronically Signed   By: Yetta Glassman M.D.   On: 10/14/2020 13:11   DG Chest Port 1 View  Result Date: 10/15/2020 CLINICAL DATA:  Pulmonary edema. EXAM: PORTABLE CHEST 1 VIEW COMPARISON:  October 14, 2020. FINDINGS: Stable cardiomegaly. Mild central pulmonary vascular congestion is noted. Bibasilar pulmonary edema or infiltrate is noted. Small bilateral pleural effusions are noted. Bony thorax is unremarkable. IMPRESSION: Stable cardiomegaly with mild central pulmonary vascular congestion. Bibasilar pulmonary edema or infiltrates are noted. Small bilateral pleural effusions are noted. Electronically Signed   By: Marijo Conception M.D.   On: 10/15/2020 08:10   DG Chest Port 1 View  Result Date: 10/14/2020 CLINICAL DATA:  Cough and chest pain. EXAM: PORTABLE CHEST 1 VIEW COMPARISON:  06/24/2020 FINDINGS: Stable cardiac enlargement. Aortic atherosclerosis. Central pulmonary vascular congestion is noted as before. New asymmetric airspace opacity is identified within the right upper lobe. Chronic pleural thickening along the lateral left base is again seen.  IMPRESSION: 1. New right upper lobe airspace opacity compatible with pneumonia. 2. Cardiac enlargement and pulmonary vascular congestion. Electronically Signed   By: Kerby Moors M.D.   On: 10/14/2020 11:34   ECHOCARDIOGRAM COMPLETE  Result Date: 10/16/2020    ECHOCARDIOGRAM REPORT   Patient Name:   William Flores Date of Exam: 10/16/2020 Medical Rec #:  [id number]      Height:       64.0 in Accession #:    [id number]     Weight:       274.9 lb Date of Birth:  07-26-1950      BSA:          2.240 m Patient Age:    70 years       BP:           115/74 mmHg Patient Gender: M              HR:           79 bpm. Exam Location:  [facility] Procedure: 2D Echo, Cardiac Doppler and Color Doppler Indications:    CHF  History:        Patient has prior history of Echocardiogram examinations, most  recent 06/21/2020. CHF, Arrythmias:Atrial Fibrillation,                 Signs/Symptoms:Syncope; Risk Factors:Hypertension, Diabetes and                 Dyslipidemia. Right Heart Failure, Nonischemic cardiomyopathy,                 Morbid obesity.  Sonographer:    Wenda Low Referring Phys: [place]  1. Left ventricular ejection fraction, by estimation, is 55 to 60%. The left ventricle has normal function. The left ventricle has no regional wall motion abnormalities. There is moderate concentric left ventricular hypertrophy. Left ventricular diastolic function could not be evaluated. There is the interventricular septum is flattened in systole and diastole, consistent with right ventricular pressure and volume overload.  2. Right ventricular systolic function is moderately reduced. The right ventricular size is severely enlarged.  3. Left atrial size was moderately dilated.  4. Right atrial size was severely dilated.  5. The mitral valve is degenerative. Mild mitral valve regurgitation. No evidence of mitral stenosis.  6. The aortic valve is tricuspid. There is moderate calcification  of the aortic valve. There is moderate thickening of the aortic valve. Aortic valve regurgitation is not visualized. Mild to moderate aortic valve sclerosis/calcification is present, without any evidence of aortic stenosis. Comparison(s): No significant change from prior study. Conclusion(s)/Recommendation(s): Findings consistent with Cor Pulmonale. FINDINGS  Left Ventricle: Left ventricular ejection fraction, by estimation, is 55 to 60%. The left ventricle has normal function. The left ventricle has no regional wall motion abnormalities. Definity contrast agent was given IV to delineate the left ventricular  endocardial borders. The left ventricular internal cavity size was normal in size. There is moderate concentric left ventricular hypertrophy. The interventricular septum is flattened in systole and diastole, consistent with right ventricular pressure and volume overload. Left ventricular diastolic function could not be evaluated due to atrial fibrillation. Left ventricular diastolic function could not be evaluated. Right Ventricle: The right ventricular size is severely enlarged. No increase in right ventricular wall thickness. Right ventricular systolic function is moderately reduced. Left Atrium: Left atrial size was moderately dilated. Right Atrium: Right atrial size was severely dilated. Pericardium: Trivial pericardial effusion is present. Mitral Valve: The mitral valve is degenerative in appearance. Mild to moderate mitral annular calcification. Mild mitral valve regurgitation. No evidence of mitral valve stenosis. MV peak gradient, 10.0 mmHg. The mean mitral valve gradient is 2.0 mmHg. Tricuspid Valve: The tricuspid valve is grossly normal. Tricuspid valve regurgitation is mild . No evidence of tricuspid stenosis. Aortic Valve: The aortic valve is tricuspid. There is moderate calcification of the aortic valve. There is moderate thickening of the aortic valve. Aortic valve regurgitation is not visualized.  Mild to moderate aortic valve sclerosis/calcification is present, without any evidence of aortic stenosis. Aortic valve mean gradient measures 4.0 mmHg. Aortic valve peak gradient measures 9.0 mmHg. Aortic valve area, by VTI measures 2.01 cm. Pulmonic Valve: The pulmonic valve was grossly normal. Pulmonic valve regurgitation is not visualized. No evidence of pulmonic stenosis. Aorta: The aortic root and ascending aorta are structurally normal, with no evidence of dilitation. Venous: The inferior vena cava was not well visualized. IAS/Shunts: The atrial septum is grossly normal.  LEFT VENTRICLE PLAX 2D LVIDd:         5.50 cm   Diastology LVIDs:         3.50 cm   LV e' medial:    6.31  cm/s LV PW:         1.30 cm   LV E/e' medial:  21.6 LV IVS:        1.50 cm   LV e' lateral:   9.90 cm/s LVOT diam:     2.00 cm   LV E/e' lateral: 13.7 LV SV:         58 LV SV Index:   26 LVOT Area:     3.14 cm  RIGHT VENTRICLE RV Basal diam:  4.70 cm RV Mid diam:    3.90 cm RV S prime:     10.10 cm/s TAPSE (M-mode): 1.3 cm LEFT ATRIUM              Index        RIGHT ATRIUM           Index LA diam:        5.70 cm  2.55 cm/m   RA Area:     30.50 cm LA Vol (A2C):   137.0 ml 61.17 ml/m  RA Volume:   121.00 ml 54.03 ml/m LA Vol (A4C):   95.4 ml  42.60 ml/m LA Biplane Vol: 120.0 ml 53.58 ml/m  AORTIC VALVE                    PULMONIC VALVE AV Area (Vmax):    1.87 cm     PV Vmax:       0.57 m/s AV Area (Vmean):   2.03 cm     PV Peak grad:  1.3 mmHg AV Area (VTI):     2.01 cm AV Vmax:           150.00 cm/s AV Vmean:          96.900 cm/s AV VTI:            0.289 m AV Peak Grad:      9.0 mmHg AV Mean Grad:      4.0 mmHg LVOT Vmax:         89.20 cm/s LVOT Vmean:        62.600 cm/s LVOT VTI:          0.185 m LVOT/AV VTI ratio: 0.64  AORTA Ao Root diam: 3.20 cm Ao Asc diam:  3.20 cm MITRAL VALVE                TRICUSPID VALVE MV Area (PHT): 3.61 cm     TR Peak grad:   74.3 mmHg MV Area VTI:   1.74 cm     TR Vmax:        431.00 cm/s MV  Peak grad:  10.0 mmHg MV Mean grad:  2.0 mmHg     SHUNTS MV Vmax:       1.58 m/s     Systemic VTI:  0.18 m MV Vmean:      46.3 cm/s    Systemic Diam: 2.00 cm MV Decel Time: 210 msec MV E velocity: 136.00 cm/s MV A velocity: 42.70 cm/s MV E/A ratio:  3.19 Eleonore Chiquito MD Electronically signed by Eleonore Chiquito MD Signature Date/Time: 10/16/2020/4:13:15 PM    Final    [place] ASCITES (ABDOMEN LIMITED)  Result Date: 10/14/2020 CLINICAL DATA:  Abdominal distension question ascites. History CHF, cardiomyopathy, coronary artery disease, colon cancer, diabetes mellitus, hypertension EXAM: LIMITED ABDOMEN ULTRASOUND FOR ASCITES TECHNIQUE: Limited ultrasound survey for ascites was performed in all four abdominal quadrants. COMPARISON:  CT abdomen and pelvis 10/14/2020 FINDINGS: Small collection of ascites in LEFT  lower quadrant, containing bowel loops. No other significant ascites seen. Volume of ascites is insufficient for paracentesis. IMPRESSION: Low volume ascites insufficient for paracentesis. Electronically Signed   By: Lavonia Dana M.D.   On: 10/14/2020 15:49   US LIVER DOPPLER  Result Date: 10/15/2020 CLINICAL DATA:  Cirrhosis. EXAM: DUPLEX ULTRASOUND OF LIVER TECHNIQUE: Color and duplex Doppler ultrasound was performed to evaluate the hepatic in-flow and out-flow vessels. COMPARISON:  10/14/2020 and CT 10/14/2020 FINDINGS: Liver: Nodular contour compatible with cirrhosis. Perihepatic ascites. Main Portal Vein size: 1.3 cm Portal Vein Velocities Main Prox:  54 cm/sec Main Mid: 47 cm/sec Main Dist:  24 cm/sec Right: 42 cm/sec Left: 27 cm/sec Hepatic Vein Velocities Right:  64 cm/sec Middle:  59 cm/sec Left:  87 cm/sec IVC: Present and patent with normal respiratory phasicity. Hepatic Artery Velocity:  95 cm/sec Splenic Vein Velocity:  23 cm/sec Spleen: 17.1 cm x 18.7 cm x 7.3 cm with a total volume of 1229 cm^3 (411 cm^3 is upper limit normal) Portal Vein Occlusion/Thrombus: No Splenic Vein Occlusion/Thrombus: No  Ascites: Present Varices: Limited evaluation Normal hepatopetal flow in the portal veins. Normal hepatofugal flow in the hepatic veins. IMPRESSION: 1. Cirrhosis with splenomegaly and perihepatic ascites. Findings are suggestive for portal hypertension. 2. Portal venous system is patent with normal direction of flow. Electronically Signed   By: Markus Daft M.D.   On: 10/15/2020 11:43    Orson Eva, DO  [facility]  If 7PM-7AM, please contact night-coverage [email or web address] Password [email or web address] 10/21/2020, 6:03 PM   LOS: 7 days

## 2020-10-21 NOTE — Progress Notes (Signed)
Patient has had minimal amount of blood when using restroom.  No blood at all in when patient used the bathroom after 11pm.

## 2020-10-22 ENCOUNTER — Telehealth (INDEPENDENT_AMBULATORY_CARE_PROVIDER_SITE_OTHER): Payer: Self-pay

## 2020-10-22 ENCOUNTER — Telehealth: Payer: Self-pay | Admitting: Gastroenterology

## 2020-10-22 DIAGNOSIS — D472 Monoclonal gammopathy: Secondary | ICD-10-CM

## 2020-10-22 LAB — BPAM RBC
Blood Product Expiration Date: 202211192359
Blood Product Expiration Date: 202211192359
ISSUE DATE / TIME: 202210181542
ISSUE DATE / TIME: 202210190119
Unit Type and Rh: 5100
Unit Type and Rh: 5100

## 2020-10-22 LAB — TYPE AND SCREEN
ABO/RH(D): O POS
Antibody Screen: NEGATIVE
Unit division: 0
Unit division: 0

## 2020-10-22 LAB — CBC
HCT: 30.4 % — ABNORMAL LOW (ref 39.0–52.0)
Hemoglobin: 8.6 g/dL — ABNORMAL LOW (ref 13.0–17.0)
MCH: 26.6 pg (ref 26.0–34.0)
MCHC: 28.3 g/dL — ABNORMAL LOW (ref 30.0–36.0)
MCV: 94.1 fL (ref 80.0–100.0)
Platelets: 103 10*3/uL — ABNORMAL LOW (ref 150–400)
RBC: 3.23 MIL/uL — ABNORMAL LOW (ref 4.22–5.81)
RDW: 18.6 % — ABNORMAL HIGH (ref 11.5–15.5)
WBC: 4.6 10*3/uL (ref 4.0–10.5)
nRBC: 0 % (ref 0.0–0.2)

## 2020-10-22 LAB — COMPREHENSIVE METABOLIC PANEL
ALT: 12 U/L (ref 0–44)
AST: 16 U/L (ref 15–41)
Albumin: 3.3 g/dL — ABNORMAL LOW (ref 3.5–5.0)
Alkaline Phosphatase: 64 U/L (ref 38–126)
Anion gap: 6 (ref 5–15)
BUN: 41 mg/dL — ABNORMAL HIGH (ref 8–23)
CO2: 25 mmol/L (ref 22–32)
Calcium: 8.8 mg/dL — ABNORMAL LOW (ref 8.9–10.3)
Chloride: 105 mmol/L (ref 98–111)
Creatinine, Ser: 1.95 mg/dL — ABNORMAL HIGH (ref 0.61–1.24)
GFR, Estimated: 37 mL/min — ABNORMAL LOW (ref >60)
Glucose, Bld: 114 mg/dL — ABNORMAL HIGH (ref 70–99)
Potassium: 5 mmol/L (ref 3.5–5.1)
Sodium: 136 mmol/L (ref 135–145)
Total Bilirubin: 0.9 mg/dL (ref 0.3–1.2)
Total Protein: 6.5 g/dL (ref 6.5–8.1)

## 2020-10-22 LAB — GLUCOSE, CAPILLARY
Glucose-Capillary: 108 mg/dL — ABNORMAL HIGH (ref 70–99)
Glucose-Capillary: 143 mg/dL — ABNORMAL HIGH (ref 70–99)
Glucose-Capillary: 103 mg/dL — ABNORMAL HIGH (ref 70–99)
Glucose-Capillary: 142 mg/dL — ABNORMAL HIGH (ref 70–99)

## 2020-10-22 NOTE — Progress Notes (Signed)
Briefly seen: patient with no further rectal bleeding. Hgb stable at 8.6. no need for repeat colonoscopy now. 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 Jenetta Downer. He has follow up office visit already arranged next month. GI to sign off. Call with questions.  Laureen Ochs. Bernarda Caffey [facility] [phone] 10/20/202212:44 PM

## 2020-10-22 NOTE — Telephone Encounter (Signed)
Patient inpatient, GI signing off today. He will need CBC in one week and will keep office visit with Dr. Jenetta Downer as scheduled. Can you arrange for CBC in one week?

## 2020-10-22 NOTE — Telephone Encounter (Signed)
error 

## 2020-10-22 NOTE — Progress Notes (Signed)
PROGRESS NOTE  William Flores YTK:[id number] DOB: 04/15/1950 DOA: 10/14/2020 PCP: [name], [name], MD   Brief History:  [name] is a medically complex gentleman who presents with multiple complaints including increasing generalized weakness and debility, increasing abdominal distention, uncontrolled acid reflux symptoms, shortness of breath, inability to lie recumbent, DOE, increasing edema in lower extremities, occasional black stools, and worsening LUQ abd pain.  He is followed by the heart failure team in [place].  He has pulmonary hypertension and is on sildenafil TID. He was found to be anemic, hemoglobin at 7.1 on admission 10/12,  5.8 on 10/13, requiring 2x PRBC, now 7.7 10/14. Warfarin was stopped, vitamin K given, IV lasix was given for his abdominal edema and small volume ascites. IV protonix bid has been continued. Pulmonary edema and pleural effusions noted 10/13 on CXR, lasix 89m bid continued. Gi did colonoscopy 10/15 with findings of large rectal polyps with intermittent oozing from polyp, biopsies taken.     Assessment/Plan: GI bleed /   Hematochezia -anemic 5.8 normocytic, 2x PRBC ordered 10/13 -hemoglobin stable at 8.2 which is reassuring   -GI did colonoscopy 10/15 with findings of large rectal polyps with intermittent oozing from polyp, biopsies taken -pathology showed high grade dysplasia---will need repeat colonoscopy in 3 months - GI recommends restarting warfarin in 1 week (10/24/20) - 10/18--one additional unit PRBC (3 total for admit) and 2 FFP     Acute blood loss anemia -secondary to lower GI bleed, GI consulted, transfused, stable at this time - Hg remains reassuring at 8.6 -er GI transfuse for Hg<8.   - recheck CBC daily to follow Hg  -baseline Hgb~9     Anticoagulated on Coumadin -discontinued warfarin 10/12 -5 mg vitamin K given 10/12 -7.5 mg vitamin k  10/13 -Initial INR 3.6 on 10/12,  10/13 3.2, 10/14 1.8 - per GI can restart  warfarin in 1 week (10/24/20) -plan for INR lower 2's   Thrombocytopenia -due to liver cirrhosis -monitor for signs of bleeding -restart warfarin on 10/22 per GI     Morbid obesity with BMI of 45.0-49.9, adult -outpatient lifestyle modifications -plausible obesity hypoventilation     Liver cirrhosis--decompensated -10/14/20 CT abd--nodular liver; Small volume abdominal ascites. Diffuse soft tissue anasarca -10/14/20 abd ultrasound shows cirrhosis with splenomegaly and perihepatic ascites. Findings are suggestive for portal hypertension. Portal venous system is patent with normal direction of flow.  -Low volume ascites insufficient for paracentesis - outpatient follow up with GI for cirrhosis care      Gout -no current complaints -continue allopurinol     History of colon cancer -previous colonoscopy with several polyps removed - frequent polyps seen on 10/15 - repeat colon survey recommended in 3 months    Cor Pulmonale/Acute on chronic diastolic CHF -significant right ventricle dilation with evidence of pulmonary artery hypertension -10/16/20 Echo EF 55-60%, no WMA, IV septum flattened; severe enlarged RV -spiro 50 mg daily>>hold due to risking K -restart sildenafil 20 mg tid -restarted torsemide 60 mg bid   CKD  3b -baseline creatinine 1.8-2.2 -monitor with diuresis     OSA (obstructive sleep apnea) -uses CPAP at home, order CPAP in hospital    Chronic Respiratory Failure with Hypoxia -chronically on 2L at home     Chronic Atrial fibrillation with slow ventricular response -currently in Afib, rate in the 80's -was anticoagulated INR 3.6 -Stopped warfarin temporarily due to gi bleed, plan to restart around 10/24/20  -restart  low dose metoprolol     DM (diabetes mellitus), type 2 with renal complications  50/93/26 Z1I--4.5 -continue novolog sliding scale -decrease semglee to 7     MGUS (monoclonal gammopathy of unknown significance)  - outpatient follow up   -Previously followed by hem/onc but has not seen in several years     Hyperlipidemia associated with type 2 diabetes mellitus  -continue atorvastatin     Pulmonary hypertension -continue sildenafil         Status is: Inpatient   Remains inpatient appropriate because: severity of illness requiring PRBC and FFP transfusions               Family Communication:  no Family at bedside   Consultants:  GI   Code Status:  FULL    DVT Prophylaxis:  warfarin on hold     Procedures: As Listed in Progress Note Above   Antibiotics: None    Subjective: Patient states his leg edema is improving.  Denies cp, sob, n/v/d, abd pain. F/c  Objective: Vitals:   10/21/20 2018 10/22/20 0310 10/22/20 0500 10/22/20 1341  BP: 125/70 121/80  123/78  Pulse: 85 96  95  Resp: 19 20  19   Temp: 98.7 F (37.1 C) 98 F (36.7 C)  98.6 F (37 C)  TempSrc: Oral   Oral  SpO2: 97% 98%  98%  Weight:   124.9 kg   Height:        Intake/Output Summary (Last 24 hours) at 10/22/2020 1702 Last data filed at 10/22/2020 1300 Gross per 24 hour  Intake 1080 ml  Output --  Net 1080 ml   Weight change: 0 kg Exam:  General:  Pt is alert, follows commands appropriately, not in acute distress HEENT: No icterus, No thrush, No neck mass, [place]/AT Cardiovascular: RRR, S1/S2, no rubs, no gallops Respiratory: bibasilar crackles. No wheeze Abdomen: Soft/+BS, non tender, non distended, no guarding Extremities: 1 + LE edema, No lymphangitis, No petechiae, No rashes, no synovitis   Data Reviewed: I have personally reviewed following labs and imaging studies Basic Metabolic Panel: Recent Labs  Lab 10/16/20 0529 10/17/20 0428 10/18/20 0532 10/19/20 0038 10/20/20 0656 10/21/20 0628 10/22/20 0442  NA 137   < > 134* 134* 137 136 136  K 3.8   < > 3.9 4.3 5.0 4.4 5.0  CL 106   < > 103 103 105 105 105  CO2 24   < > 25 25 27 25 25   GLUCOSE 108*   < > 99 162* 142* 103* 114*  BUN 68*   < > 52* 47* 47*  44* 41*  CREATININE 2.49*   < > 2.20* 2.22* 2.35* 1.86* 1.95*  CALCIUM 8.3*   < > 8.4* 8.5* 8.6* 8.4* 8.8*  MG 2.2  --   --   --   --  2.4  --    < > = values in this interval not displayed.   Liver Function Tests: Recent Labs  Lab 10/17/20 0428 10/18/20 0532 10/19/20 0038 10/21/20 0628 10/22/20 0442  AST 17 17  --  15 16  ALT 20 16  --  12 12  ALKPHOS 58 60  --  55 64  BILITOT 0.9 0.8  --  1.0 0.9  PROT 6.3* 6.0*  --  6.0* 6.5  ALBUMIN 3.2* 3.1* 3.3* 3.1* 3.3*   No results for input(s): LIPASE, AMYLASE in the last 168 hours. Recent Labs  Lab 10/16/20 0529  AMMONIA 49*   Coagulation Profile: Recent  Labs  Lab 10/16/20 0529 10/17/20 0428 10/19/20 0038 10/20/20 0815 10/21/20 0628  INR 1.8* 1.4* 1.5* 1.5* 1.5*   CBC: Recent Labs  Lab 10/18/20 0532 10/19/20 0038 10/20/20 0655 10/20/20 1614 10/21/20 0628 10/22/20 0442  WBC 4.5 4.3 4.7  --  4.3 4.6  HGB 8.2* 8.5* 8.2* 7.7* 8.5* 8.6*  HCT 29.4* 30.1* 29.3* 27.6* 29.4* 30.4*  MCV 90.5 92.3 93.9  --  93.0 94.1  PLT 107* 130* 108*  --  93* 103*   Cardiac Enzymes: No results for input(s): CKTOTAL, CKMB, CKMBINDEX, TROPONINI in the last 168 hours. BNP: Invalid input(s): POCBNP CBG: Recent Labs  Lab 10/21/20 1649 10/21/20 2019 10/22/20 0713 10/22/20 1103 10/22/20 1625  GLUCAP 108* 157* 108* 143* 103*   HbA1C: No results for input(s): HGBA1C in the last 72 hours. Urine analysis:    Component Value Date/Time   COLORURINE YELLOW 06/21/2020 1059   APPEARANCEUR CLEAR 06/21/2020 1059   LABSPEC 1.020 06/21/2020 1059   PHURINE 5.5 06/21/2020 1059   GLUCOSEU NEGATIVE 06/21/2020 1059   HGBUR LARGE (A) 06/21/2020 1059   BILIRUBINUR NEGATIVE 06/21/2020 1059   KETONESUR NEGATIVE 06/21/2020 1059   PROTEINUR 100 (A) 06/21/2020 1059   UROBILINOGEN 0.2 05/16/2014 0822   NITRITE NEGATIVE 06/21/2020 1059   LEUKOCYTESUR NEGATIVE 06/21/2020 1059   Sepsis Labs: @LABRCNTIP (procalcitonin:4,lacticidven:4) ) Recent  Results (from the past 240 hour(s))  Resp Panel by RT-PCR (Flu A&B, Covid) Nasopharyngeal Swab     Status: None   Collection Time: 10/14/20  2:15 PM   Specimen: Nasopharyngeal Swab; Nasopharyngeal(NP) swabs in vial transport medium  Result Value Ref Range Status   SARS Coronavirus 2 by RT PCR NEGATIVE NEGATIVE Final    Comment: (NOTE) SARS-CoV-2 target nucleic acids are NOT DETECTED.  The SARS-CoV-2 RNA is generally detectable in upper respiratory specimens during the acute phase of infection. The lowest concentration of SARS-CoV-2 viral copies this assay can detect is 138 copies/mL. A negative result does not preclude SARS-Cov-2 infection and should not be used as the sole basis for treatment or other patient management decisions. A negative result may occur with  improper specimen collection/handling, submission of specimen other than nasopharyngeal swab, presence of viral mutation(s) within the areas targeted by this assay, and inadequate number of viral copies(<138 copies/mL). A negative result must be combined with clinical observations, patient history, and epidemiological information. The expected result is Negative.  Fact Sheet for Patients:  EntrepreneurPulse.com.au  Fact Sheet for Healthcare Providers:  IncredibleEmployment.be  This test is no t yet approved or cleared by the [place] FDA and  has been authorized for detection and/or diagnosis of SARS-CoV-2 by FDA under an Emergency Use Authorization (EUA). This EUA will remain  in effect (meaning this test can be used) for the duration of the COVID-19 declaration under Section 564(b)(1) of the Act, 21 U.S.C.section 360bbb-3(b)(1), unless the authorization is terminated  or revoked sooner.       Influenza A by PCR NEGATIVE NEGATIVE Final   Influenza B by PCR NEGATIVE NEGATIVE Final    Comment: (NOTE) The Xpert Xpress SARS-CoV-2/FLU/RSV plus assay is intended as an aid in the  diagnosis of influenza from Nasopharyngeal swab specimens and should not be used as a sole basis for treatment. Nasal washings and aspirates are unacceptable for Xpert Xpress SARS-CoV-2/FLU/RSV testing.  Fact Sheet for Patients: EntrepreneurPulse.com.au  Fact Sheet for Healthcare Providers: IncredibleEmployment.be  This test is not yet approved or cleared by the [place] FDA and has been authorized for detection  and/or diagnosis of SARS-CoV-2 by FDA under an Emergency Use Authorization (EUA). This EUA will remain in effect (meaning this test can be used) for the duration of the COVID-19 declaration under Section 564(b)(1) of the Act, 21 U.S.C. section 360bbb-3(b)(1), unless the authorization is terminated or revoked.  Performed at [facility], [address], [place], [place] [address]      Scheduled Meds:  sodium chloride   Intravenous Once   sodium chloride   Intravenous Once   allopurinol  300 mg Oral Daily   atorvastatin  20 mg Oral QPM   ferrous sulfate  325 mg Oral Q breakfast   insulin aspart  0-9 Units Subcutaneous TID WC   insulin glargine-yfgn  7 Units Subcutaneous Q2200   metoprolol succinate  25 mg Oral Daily   pantoprazole  40 mg Oral BID AC   sildenafil  20 mg Oral TID   spironolactone  50 mg Oral Daily   torsemide  60 mg Oral BID   Continuous Infusions:  Procedures/Studies: CT Abdomen Pelvis Wo Contrast  Result Date: 10/14/2020 CLINICAL DATA:  Evaluate for infection EXAM: CT CHEST, ABDOMEN AND PELVIS WITHOUT CONTRAST TECHNIQUE: Multidetector CT imaging of the chest, abdomen and pelvis was performed following the standard protocol without IV contrast. COMPARISON:  CT abdomen and pelvis dated December 26, 2018 FINDINGS: CT CHEST FINDINGS Cardiovascular: Cardiomegaly. No pericardial effusion. Three-vessel coronary artery calcifications. Mitral annular calcifications. Atherosclerotic disease of the thoracic aorta.  Mediastinum/Nodes: Mildly enlarged mediastinal lymph nodes which are likely reactive. Reference right pulmonary ligament lymph node measuring 1.4 cm in short axis on series 2, image 31. Esophagus and thyroid are unremarkable. Lungs/Pleura: Central airways are patent. Small bilateral pleural effusions and atelectasis. Ground-glass opacities of the bilateral upper lungs. Solid pulmonary nodule of the right lower lobe measuring 3 mm in mean diameter on series 3, image 78. Musculoskeletal: No chest wall mass or suspicious bone lesions identified. CT ABDOMEN PELVIS FINDINGS Hepatobiliary: Nodular liver contour. Gallbladder is unremarkable. No biliary ductal dilation. Pancreas: Mild peripancreatic fat stranding, likely due to volume status given normal serum lipase. Otherwise unremarkable. Spleen: Unremarkable. Adrenals/Urinary Tract: Bilateral adrenal glands are unremarkable. Cortical thinning of the bilateral kidneys. No evidence of hydronephrosis or nephrolithiasis. Distended urinary bladder. Stomach/Bowel: Stomach is within normal limits. Prior right hemi colectomy. No evidence of bowel wall thickening, distention, or inflammatory changes. Vascular/Lymphatic: Aortic atherosclerosis. No enlarged abdominal or pelvic lymph nodes. Reproductive: Prostate is unremarkable. Other: Small volume abdominal ascites. Diffuse soft tissue anasarca. Musculoskeletal: No acute or significant osseous findings. IMPRESSION: Mild bilateral ground-glass opacities which are most prominent in the upper lungs, favor pulmonary edema, although an infectious process could appear similar. Small bilateral pleural effusions, small volume abdominal ascites, and soft tissue anasarca, likely due to volume status. Nodular liver contour, suggestive of cirrhosis. Solid pulmonary nodule the right lower lobe measuring 3 mm. No follow-up needed if patient is low-risk. Non-contrast chest CT can be considered in 12 months if patient is high-risk. This  recommendation follows the consensus statement: Guidelines for Management of Incidental Pulmonary Nodules Detected on CT Images: From the Fleischner Society 2017; Radiology 2017; 284:228-243. Aortic Atherosclerosis (ICD10-I70.0). Electronically Signed   By: Yetta Glassman M.D.   On: 10/14/2020 13:11   CT Chest Wo Contrast  Result Date: 10/14/2020 CLINICAL DATA:  Evaluate for infection EXAM: CT CHEST, ABDOMEN AND PELVIS WITHOUT CONTRAST TECHNIQUE: Multidetector CT imaging of the chest, abdomen and pelvis was performed following the standard protocol without IV contrast. COMPARISON:  CT abdomen  and pelvis dated December 26, 2018 FINDINGS: CT CHEST FINDINGS Cardiovascular: Cardiomegaly. No pericardial effusion. Three-vessel coronary artery calcifications. Mitral annular calcifications. Atherosclerotic disease of the thoracic aorta. Mediastinum/Nodes: Mildly enlarged mediastinal lymph nodes which are likely reactive. Reference right pulmonary ligament lymph node measuring 1.4 cm in short axis on series 2, image 31. Esophagus and thyroid are unremarkable. Lungs/Pleura: Central airways are patent. Small bilateral pleural effusions and atelectasis. Ground-glass opacities of the bilateral upper lungs. Solid pulmonary nodule of the right lower lobe measuring 3 mm in mean diameter on series 3, image 78. Musculoskeletal: No chest wall mass or suspicious bone lesions identified. CT ABDOMEN PELVIS FINDINGS Hepatobiliary: Nodular liver contour. Gallbladder is unremarkable. No biliary ductal dilation. Pancreas: Mild peripancreatic fat stranding, likely due to volume status given normal serum lipase. Otherwise unremarkable. Spleen: Unremarkable. Adrenals/Urinary Tract: Bilateral adrenal glands are unremarkable. Cortical thinning of the bilateral kidneys. No evidence of hydronephrosis or nephrolithiasis. Distended urinary bladder. Stomach/Bowel: Stomach is within normal limits. Prior right hemi colectomy. No evidence of bowel  wall thickening, distention, or inflammatory changes. Vascular/Lymphatic: Aortic atherosclerosis. No enlarged abdominal or pelvic lymph nodes. Reproductive: Prostate is unremarkable. Other: Small volume abdominal ascites. Diffuse soft tissue anasarca. Musculoskeletal: No acute or significant osseous findings. IMPRESSION: Mild bilateral ground-glass opacities which are most prominent in the upper lungs, favor pulmonary edema, although an infectious process could appear similar. Small bilateral pleural effusions, small volume abdominal ascites, and soft tissue anasarca, likely due to volume status. Nodular liver contour, suggestive of cirrhosis. Solid pulmonary nodule the right lower lobe measuring 3 mm. No follow-up needed if patient is low-risk. Non-contrast chest CT can be considered in 12 months if patient is high-risk. This recommendation follows the consensus statement: Guidelines for Management of Incidental Pulmonary Nodules Detected on CT Images: From the Fleischner Society 2017; Radiology 2017; 284:228-243. Aortic Atherosclerosis (ICD10-I70.0). Electronically Signed   By: Yetta Glassman M.D.   On: 10/14/2020 13:11   DG Chest Port 1 View  Result Date: 10/15/2020 CLINICAL DATA:  Pulmonary edema. EXAM: PORTABLE CHEST 1 VIEW COMPARISON:  October 14, 2020. FINDINGS: Stable cardiomegaly. Mild central pulmonary vascular congestion is noted. Bibasilar pulmonary edema or infiltrate is noted. Small bilateral pleural effusions are noted. Bony thorax is unremarkable. IMPRESSION: Stable cardiomegaly with mild central pulmonary vascular congestion. Bibasilar pulmonary edema or infiltrates are noted. Small bilateral pleural effusions are noted. Electronically Signed   By: Marijo Conception M.D.   On: 10/15/2020 08:10   DG Chest Port 1 View  Result Date: 10/14/2020 CLINICAL DATA:  Cough and chest pain. EXAM: PORTABLE CHEST 1 VIEW COMPARISON:  06/24/2020 FINDINGS: Stable cardiac enlargement. Aortic atherosclerosis.  Central pulmonary vascular congestion is noted as before. New asymmetric airspace opacity is identified within the right upper lobe. Chronic pleural thickening along the lateral left base is again seen. IMPRESSION: 1. New right upper lobe airspace opacity compatible with pneumonia. 2. Cardiac enlargement and pulmonary vascular congestion. Electronically Signed   By: Kerby Moors M.D.   On: 10/14/2020 11:34   ECHOCARDIOGRAM COMPLETE  Result Date: 10/16/2020    ECHOCARDIOGRAM REPORT   Patient Name:   William Flores Date of Exam: 10/16/2020 Medical Rec #:  [id number]      Height:       64.0 in Accession #:    [id number]     Weight:       274.9 lb Date of Birth:  October 08, 1950      BSA:  2.240 m Patient Age:    70 years       BP:           115/74 mmHg Patient Gender: M              HR:           79 bpm. Exam Location:  [facility] Procedure: 2D Echo, Cardiac Doppler and Color Doppler Indications:    CHF  History:        Patient has prior history of Echocardiogram examinations, most                 recent 06/21/2020. CHF, Arrythmias:Atrial Fibrillation,                 Signs/Symptoms:Syncope; Risk Factors:Hypertension, Diabetes and                 Dyslipidemia. Right Heart Failure, Nonischemic cardiomyopathy,                 Morbid obesity.  Sonographer:    Wenda Low Referring Phys: [place]  1. Left ventricular ejection fraction, by estimation, is 55 to 60%. The left ventricle has normal function. The left ventricle has no regional wall motion abnormalities. There is moderate concentric left ventricular hypertrophy. Left ventricular diastolic function could not be evaluated. There is the interventricular septum is flattened in systole and diastole, consistent with right ventricular pressure and volume overload.  2. Right ventricular systolic function is moderately reduced. The right ventricular size is severely enlarged.  3. Left atrial size was moderately dilated.  4. Right  atrial size was severely dilated.  5. The mitral valve is degenerative. Mild mitral valve regurgitation. No evidence of mitral stenosis.  6. The aortic valve is tricuspid. There is moderate calcification of the aortic valve. There is moderate thickening of the aortic valve. Aortic valve regurgitation is not visualized. Mild to moderate aortic valve sclerosis/calcification is present, without any evidence of aortic stenosis. Comparison(s): No significant change from prior study. Conclusion(s)/Recommendation(s): Findings consistent with Cor Pulmonale. FINDINGS  Left Ventricle: Left ventricular ejection fraction, by estimation, is 55 to 60%. The left ventricle has normal function. The left ventricle has no regional wall motion abnormalities. Definity contrast agent was given IV to delineate the left ventricular  endocardial borders. The left ventricular internal cavity size was normal in size. There is moderate concentric left ventricular hypertrophy. The interventricular septum is flattened in systole and diastole, consistent with right ventricular pressure and volume overload. Left ventricular diastolic function could not be evaluated due to atrial fibrillation. Left ventricular diastolic function could not be evaluated. Right Ventricle: The right ventricular size is severely enlarged. No increase in right ventricular wall thickness. Right ventricular systolic function is moderately reduced. Left Atrium: Left atrial size was moderately dilated. Right Atrium: Right atrial size was severely dilated. Pericardium: Trivial pericardial effusion is present. Mitral Valve: The mitral valve is degenerative in appearance. Mild to moderate mitral annular calcification. Mild mitral valve regurgitation. No evidence of mitral valve stenosis. MV peak gradient, 10.0 mmHg. The mean mitral valve gradient is 2.0 mmHg. Tricuspid Valve: The tricuspid valve is grossly normal. Tricuspid valve regurgitation is mild . No evidence of tricuspid  stenosis. Aortic Valve: The aortic valve is tricuspid. There is moderate calcification of the aortic valve. There is moderate thickening of the aortic valve. Aortic valve regurgitation is not visualized. Mild to moderate aortic valve sclerosis/calcification is present, without any evidence of aortic stenosis. Aortic valve mean  gradient measures 4.0 mmHg. Aortic valve peak gradient measures 9.0 mmHg. Aortic valve area, by VTI measures 2.01 cm. Pulmonic Valve: The pulmonic valve was grossly normal. Pulmonic valve regurgitation is not visualized. No evidence of pulmonic stenosis. Aorta: The aortic root and ascending aorta are structurally normal, with no evidence of dilitation. Venous: The inferior vena cava was not well visualized. IAS/Shunts: The atrial septum is grossly normal.  LEFT VENTRICLE PLAX 2D LVIDd:         5.50 cm   Diastology LVIDs:         3.50 cm   LV e' medial:    6.31 cm/s LV PW:         1.30 cm   LV E/e' medial:  21.6 LV IVS:        1.50 cm   LV e' lateral:   9.90 cm/s LVOT diam:     2.00 cm   LV E/e' lateral: 13.7 LV SV:         58 LV SV Index:   26 LVOT Area:     3.14 cm  RIGHT VENTRICLE RV Basal diam:  4.70 cm RV Mid diam:    3.90 cm RV S prime:     10.10 cm/s TAPSE (M-mode): 1.3 cm LEFT ATRIUM              Index        RIGHT ATRIUM           Index LA diam:        5.70 cm  2.55 cm/m   RA Area:     30.50 cm LA Vol (A2C):   137.0 ml 61.17 ml/m  RA Volume:   121.00 ml 54.03 ml/m LA Vol (A4C):   95.4 ml  42.60 ml/m LA Biplane Vol: 120.0 ml 53.58 ml/m  AORTIC VALVE                    PULMONIC VALVE AV Area (Vmax):    1.87 cm     PV Vmax:       0.57 m/s AV Area (Vmean):   2.03 cm     PV Peak grad:  1.3 mmHg AV Area (VTI):     2.01 cm AV Vmax:           150.00 cm/s AV Vmean:          96.900 cm/s AV VTI:            0.289 m AV Peak Grad:      9.0 mmHg AV Mean Grad:      4.0 mmHg LVOT Vmax:         89.20 cm/s LVOT Vmean:        62.600 cm/s LVOT VTI:          0.185 m LVOT/AV VTI ratio: 0.64  AORTA  Ao Root diam: 3.20 cm Ao Asc diam:  3.20 cm MITRAL VALVE                TRICUSPID VALVE MV Area (PHT): 3.61 cm     TR Peak grad:   74.3 mmHg MV Area VTI:   1.74 cm     TR Vmax:        431.00 cm/s MV Peak grad:  10.0 mmHg MV Mean grad:  2.0 mmHg     SHUNTS MV Vmax:       1.58 m/s     Systemic VTI:  0.18 m MV Vmean:  46.3 cm/s    Systemic Diam: 2.00 cm MV Decel Time: 210 msec MV E velocity: 136.00 cm/s MV A velocity: 42.70 cm/s MV E/A ratio:  3.19 Eleonore Chiquito MD Electronically signed by Eleonore Chiquito MD Signature Date/Time: 10/16/2020/4:13:15 PM    Final    [place] ASCITES (ABDOMEN LIMITED)  Result Date: 10/14/2020 CLINICAL DATA:  Abdominal distension question ascites. History CHF, cardiomyopathy, coronary artery disease, colon cancer, diabetes mellitus, hypertension EXAM: LIMITED ABDOMEN ULTRASOUND FOR ASCITES TECHNIQUE: Limited ultrasound survey for ascites was performed in all four abdominal quadrants. COMPARISON:  CT abdomen and pelvis 10/14/2020 FINDINGS: Small collection of ascites in LEFT lower quadrant, containing bowel loops. No other significant ascites seen. Volume of ascites is insufficient for paracentesis. IMPRESSION: Low volume ascites insufficient for paracentesis. Electronically Signed   By: Lavonia Dana M.D.   On: 10/14/2020 15:49   US LIVER DOPPLER  Result Date: 10/15/2020 CLINICAL DATA:  Cirrhosis. EXAM: DUPLEX ULTRASOUND OF LIVER TECHNIQUE: Color and duplex Doppler ultrasound was performed to evaluate the hepatic in-flow and out-flow vessels. COMPARISON:  10/14/2020 and CT 10/14/2020 FINDINGS: Liver: Nodular contour compatible with cirrhosis. Perihepatic ascites. Main Portal Vein size: 1.3 cm Portal Vein Velocities Main Prox:  54 cm/sec Main Mid: 47 cm/sec Main Dist:  24 cm/sec Right: 42 cm/sec Left: 27 cm/sec Hepatic Vein Velocities Right:  64 cm/sec Middle:  59 cm/sec Left:  87 cm/sec IVC: Present and patent with normal respiratory phasicity. Hepatic Artery Velocity:  95 cm/sec  Splenic Vein Velocity:  23 cm/sec Spleen: 17.1 cm x 18.7 cm x 7.3 cm with a total volume of 1229 cm^3 (411 cm^3 is upper limit normal) Portal Vein Occlusion/Thrombus: No Splenic Vein Occlusion/Thrombus: No Ascites: Present Varices: Limited evaluation Normal hepatopetal flow in the portal veins. Normal hepatofugal flow in the hepatic veins. IMPRESSION: 1. Cirrhosis with splenomegaly and perihepatic ascites. Findings are suggestive for portal hypertension. 2. Portal venous system is patent with normal direction of flow. Electronically Signed   By: Markus Daft M.D.   On: 10/15/2020 11:43    Orson Eva, DO  [facility]  If 7PM-7AM, please contact night-coverage [email or web address] Password [email or web address] 10/22/2020, 5:02 PM   LOS: 8 days

## 2020-10-22 NOTE — TOC Progression Note (Signed)
Transition of Care [facility]) - Progression Note    Patient Details  Name: William Flores MRN: [id number] Date of Birth: [birth year]  Transition of Care [facility] CM/SW Contact  Shade Flood, LCSW Phone Number: 10/22/2020, 10:32 AM  Clinical Narrative:     TOC following. MD indicating pt will likely be stable for dc tomorrow. Met with pt to review dc planning. Pt asking if he can go to rehab as he feels too weak to manage at home. Discussed CMS provider options and will refer as requested.   Expected Discharge Plan: [place] Barriers to Discharge: Continued Medical Work up  Expected Discharge Plan and Services Expected Discharge Plan: [place] In-house Referral: Clinical Social Work   Post Acute Care Choice: Manor Living arrangements for the past 2 months: Single Family Home                 DME Arranged: N/A         HH Arranged: RN, PT [place] Agency: [place] ([facility]) Date [place]: 10/15/20 Time [place]: [place] Representative spoke with at [place]: [place] ([place]) Interventions    Readmission Risk Interventions Readmission Risk Prevention Plan 10/15/2020 04/15/2019  Transportation Screening Complete Complete  PCP or Specialist Appt within 3-5 Days - Complete  HRI or [place] Complete Complete  Social Work Consult for [place] Planning/Counseling Complete Complete  Palliative Care Screening Not Applicable Not Applicable  Medication Review Press photographer) Complete Complete  Some recent data might be hidden

## 2020-10-22 NOTE — Progress Notes (Signed)
Pt ambulated in hallway 58 feet using FWW, standby assistance and O2 @ 2lpm [place]. C/o feeling "real, real weak" , but steady gait and tolerated well. Pt states that he is too weak to function alone at home, pt states he feels he needs to go to rehab for a couple of weeks to build up his strength. MD Tat updated on pt concern and performance with ambulation.

## 2020-10-22 NOTE — NC FL2 (Signed)
[place] LEVEL OF CARE SCREENING TOOL     IDENTIFICATION  Patient Name: William Flores Birthdate: [birth year] Sex: male Admission Date (Current Location): 10/14/2020  [facility] and [place] Number:  [employer] and Address:  [facility] [address], [place]      Provider Number: [id number]  Attending Physician Name and Address:  Orson Eva, MD  Relative Name and Phone Number:       Current Level of Care: Hospital Recommended Level of Care: [place] Prior Approval Number:    Date Approved/Denied:   PASRR Number: [id number]  Discharge Plan: SNF    Current Diagnoses: Patient Active Problem List   Diagnosis Date Noted   Acute blood loss anemia    GI bleed 10/14/2020   Acquired thrombophilia ([place]) 10/14/2020   Cirrhosis ([place]) 10/14/2020   Abdominal distension    Anemia, iron deficiency 07/21/2020   Chronic gastritis without bleeding    AKI (acute kidney injury) ([place])    Syncope and collapse    Supratherapeutic INR    Syncope 06/19/2020   Hypokalemia 06/19/2020   Fecal occult blood test positive 06/19/2020   Cor pulmonale, acute ([place]) 04/15/2019   Bilateral leg edema    Chronic renal failure    Acute on chronic congestive heart failure ([place]) 03/29/2019   Pulmonary hypertension ([place]) 12/28/2018   Hyperlipidemia associated with type 2 diabetes mellitus ([place]) 05/18/2017   Morbid obesity with BMI of 45.0-49.9, adult ([place]) 05/18/2017   CKD stage 3 due to type 2 diabetes mellitus ([place]) 09/17/2015   MGUS (monoclonal gammopathy of unknown significance) 05/27/2015   Normocytic anemia 11/04/2014   Peripheral edema    Essential hypertension    DM (diabetes mellitus), type 2 with renal complications ([place]) 40/10/2723   Atrial fibrillation with slow ventricular response ([place]) 04/14/2014   Anticoagulated on Coumadin 03/20/2014   Nonischemic cardiomyopathy ([place]) 02/20/2014   OSA (obstructive sleep apnea)     Right heart failure ([place]) 02/10/2014   Morbid obesity ([place]) 08/05/2013   History of colon cancer    Gout 05/25/2012   Hypertensive heart disease     Orientation RESPIRATION BLADDER Height & Weight     Self, Time, Situation, Place  O2 (see dc summary) Continent Weight: 275 lb 5.7 oz (124.9 kg) Height:  5' 4"  (162.6 cm)  BEHAVIORAL SYMPTOMS/MOOD NEUROLOGICAL BOWEL NUTRITION STATUS      Continent Diet (see dc summary)  AMBULATORY STATUS COMMUNICATION OF NEEDS Skin   Limited Assist Verbally Normal                       Personal Care Assistance Level of Assistance  Bathing, Feeding, Dressing Bathing Assistance: Limited assistance Feeding assistance: Independent Dressing Assistance: Limited assistance     Functional Limitations Info  Sight, Hearing, Speech Sight Info: Adequate Hearing Info: Adequate Speech Info: Adequate    SPECIAL CARE FACTORS FREQUENCY  PT (By licensed PT), OT (By licensed OT)     PT Frequency: 3-5x week OT Frequency: 1-2x week            Contractures Contractures Info: Not present    Additional Factors Info  Code Status, Allergies Code Status Info: Full Allergies Info: Codeine           Current Medications (10/22/2020):  This is the current hospital active medication list Current Facility-Administered Medications  Medication Dose Route Frequency Provider Last Rate Last Admin   0.9 %  sodium chloride infusion (  Manually program via Guardrails IV Fluids)   Intravenous Once Murlean Iba, MD   Held at 10/20/20 1005   0.9 %  sodium chloride infusion (Manually program via Guardrails IV Fluids)   Intravenous Once Wynetta Emery, Clanford L, MD       acetaminophen (TYLENOL) tablet 650 mg  650 mg Oral Q6H PRN Wynetta Emery, Clanford L, MD   650 mg at 10/16/20 0129   Or   acetaminophen (TYLENOL) suppository 650 mg  650 mg Rectal Q6H PRN Johnson, Clanford L, MD       albuterol (PROVENTIL) (2.5 MG/3ML) 0.083% nebulizer solution 2.5 mg  2.5 mg Nebulization  Q2H PRN Johnson, Clanford L, MD       allopurinol (ZYLOPRIM) tablet 300 mg  300 mg Oral Daily Johnson, Clanford L, MD   300 mg at 10/22/20 1001   alum & mag hydroxide-simeth (MAALOX/MYLANTA) 200-200-20 MG/5ML suspension 30 mL  30 mL Oral Q4H PRN Johnson, Clanford L, MD   30 mL at 10/16/20 1525   atorvastatin (LIPITOR) tablet 20 mg  20 mg Oral QPM Johnson, Clanford L, MD   20 mg at 10/21/20 1704   benzonatate (TESSALON) capsule 100 mg  100 mg Oral TID PRN Wynetta Emery, Clanford L, MD   100 mg at 10/18/20 2221   bisacodyl (DULCOLAX) EC tablet 5 mg  5 mg Oral Daily PRN Johnson, Clanford L, MD       diphenhydrAMINE (BENADRYL) capsule 25 mg  25 mg Oral Q6H PRN Johnson, Clanford L, MD       fentaNYL (SUBLIMAZE) injection 12.5-50 mcg  12.5-50 mcg Intravenous Q2H PRN Johnson, Clanford L, MD       ferrous sulfate tablet 325 mg  325 mg Oral Q breakfast Johnson, Clanford L, MD   325 mg at 10/22/20 0828   insulin aspart (novoLOG) injection 0-9 Units  0-9 Units Subcutaneous TID WC Johnson, Clanford L, MD   1 Units at 10/20/20 1720   insulin glargine-yfgn (SEMGLEE) injection 7 Units  7 Units Subcutaneous Q2200 Tat, Shanon Brow, MD   7 Units at 10/21/20 2317   metoprolol succinate (TOPROL-XL) 24 hr tablet 25 mg  25 mg Oral Daily Tat, David, MD   25 mg at 10/22/20 1001   ondansetron (ZOFRAN) tablet 4 mg  4 mg Oral Q6H PRN Johnson, Clanford L, MD       Or   ondansetron (ZOFRAN) injection 4 mg  4 mg Intravenous Q6H PRN Johnson, Clanford L, MD       pantoprazole (PROTONIX) EC tablet 40 mg  40 mg Oral BID AC Johnson, Clanford L, MD   40 mg at 10/22/20 0828   phenol (CHLORASEPTIC) mouth spray 1 spray  1 spray Mouth/Throat PRN Wynetta Emery, Clanford L, MD   1 spray at 10/16/20 0908   sildenafil (REVATIO) tablet 20 mg  20 mg Oral TID Wynetta Emery, Clanford L, MD   20 mg at 10/22/20 1001   spironolactone (ALDACTONE) tablet 50 mg  50 mg Oral Daily Montez Morita, Quillian Quince, MD   50 mg at 10/22/20 1001   torsemide (DEMADEX) tablet 60 mg  60 mg  Oral BID Tat, Shanon Brow, MD   60 mg at 10/22/20 2542   traZODone (DESYREL) tablet 25 mg  25 mg Oral QHS PRN Murlean Iba, MD   25 mg at 10/16/20 2043     Discharge Medications: Please see discharge summary for a list of discharge medications.  Relevant Imaging Results:  Relevant Lab Results:   Additional Information SSN: [id number]  Shade Flood, LCSW

## 2020-10-23 LAB — COMPREHENSIVE METABOLIC PANEL
ALT: 11 U/L (ref 0–44)
AST: 13 U/L — ABNORMAL LOW (ref 15–41)
Albumin: 3.1 g/dL — ABNORMAL LOW (ref 3.5–5.0)
Alkaline Phosphatase: 65 U/L (ref 38–126)
Anion gap: 7 (ref 5–15)
BUN: 44 mg/dL — ABNORMAL HIGH (ref 8–23)
CO2: 26 mmol/L (ref 22–32)
Calcium: 8.6 mg/dL — ABNORMAL LOW (ref 8.9–10.3)
Chloride: 104 mmol/L (ref 98–111)
Creatinine, Ser: 2.04 mg/dL — ABNORMAL HIGH (ref 0.61–1.24)
GFR, Estimated: 35 mL/min — ABNORMAL LOW (ref >60)
Glucose, Bld: 116 mg/dL — ABNORMAL HIGH (ref 70–99)
Potassium: 4.9 mmol/L (ref 3.5–5.1)
Sodium: 137 mmol/L (ref 135–145)
Total Bilirubin: 1.1 mg/dL (ref 0.3–1.2)
Total Protein: 5.9 g/dL — ABNORMAL LOW (ref 6.5–8.1)

## 2020-10-23 LAB — CBC
HCT: 28.8 % — ABNORMAL LOW (ref 39.0–52.0)
Hemoglobin: 8.3 g/dL — ABNORMAL LOW (ref 13.0–17.0)
MCH: 26.7 pg (ref 26.0–34.0)
MCHC: 28.8 g/dL — ABNORMAL LOW (ref 30.0–36.0)
MCV: 92.6 fL (ref 80.0–100.0)
Platelets: 88 10*3/uL — ABNORMAL LOW (ref 150–400)
RBC: 3.11 MIL/uL — ABNORMAL LOW (ref 4.22–5.81)
RDW: 19.1 % — ABNORMAL HIGH (ref 11.5–15.5)
WBC: 4 10*3/uL (ref 4.0–10.5)
nRBC: 0 % (ref 0.0–0.2)

## 2020-10-23 LAB — GLUCOSE, CAPILLARY
Glucose-Capillary: 113 mg/dL — ABNORMAL HIGH (ref 70–99)
Glucose-Capillary: 118 mg/dL — ABNORMAL HIGH (ref 70–99)
Glucose-Capillary: 139 mg/dL — ABNORMAL HIGH (ref 70–99)
Glucose-Capillary: 143 mg/dL — ABNORMAL HIGH (ref 70–99)

## 2020-10-23 MED ORDER — FUROSEMIDE 10 MG/ML IJ SOLN
60.0000 mg | Freq: Once | INTRAMUSCULAR | Status: AC
  Administered 2020-10-23: 60 mg via INTRAVENOUS
  Filled 2020-10-23: qty 6

## 2020-10-23 MED ORDER — TORSEMIDE 20 MG PO TABS
60.0000 mg | ORAL_TABLET | Freq: Two times a day (BID) | ORAL | Status: DC
  Administered 2020-10-24: 60 mg via ORAL
  Filled 2020-10-23: qty 3

## 2020-10-23 NOTE — Care Management Important Message (Signed)
Important Message  Patient Details  Name: William Flores MRN: [id number] Date of Birth: September 24, 1950   Medicare Important Message Given:  Yes     [name] 10/23/2020, 1:47 PM

## 2020-10-23 NOTE — Progress Notes (Signed)
PROGRESS NOTE  William Flores VHQ:[id number] DOB: 1950/01/19 DOA: 10/14/2020 PCP: [name], [name], MD  Brief History:  [name] is a medically complex gentleman who presents with multiple complaints including increasing generalized weakness and debility, increasing abdominal distention, uncontrolled acid reflux symptoms, shortness of breath, inability to lie recumbent, DOE, increasing edema in lower extremities, occasional black stools, and worsening LUQ abd pain.  He is followed by the heart failure team in [place].  He has pulmonary hypertension and is on sildenafil TID. He was found to be anemic, hemoglobin at 7.1 on admission 10/12,  5.8 on 10/13, requiring 2x PRBC, now 7.7 10/14. Warfarin was stopped, vitamin K given, IV lasix was given for his abdominal edema and small volume ascites. IV protonix bid has been continued. Pulmonary edema and pleural effusions noted 10/13 on CXR, lasix 13m bid continued. Gi did colonoscopy 10/15 with findings of large rectal polyps with intermittent oozing from polyp, biopsies taken.     Assessment/Plan: GI bleed /   Hematochezia -anemic 5.8 normocytic, 2x PRBC ordered 10/13 -hemoglobin stable at 8.2 which is reassuring   -GI did colonoscopy 10/15 with findings of large rectal polyps with intermittent oozing from polyp, biopsies taken -pathology showed high grade dysplasia---will need repeat colonoscopy in 3 months - GI recommends restarting warfarin in 1 week (10/24/20) - 10/18--one additional unit PRBC (3 total for admit) and 2 FFP     Acute blood loss anemia -secondary to lower GI bleed, GI consulted, transfused, stable at this time - Hg remains reassuring at 8.6 -er GI transfuse for Hg<8.   - recheck CBC daily to follow Hg  -baseline Hgb~9     Anticoagulated on Coumadin -discontinued warfarin 10/12 -5 mg vitamin K given 10/12 -7.5 mg vitamin k  10/13 -Initial INR 3.6 on 10/12,  10/13 3.2, 10/14 1.8 - per GI can restart  warfarin in 1 week (10/24/20) -plan for INR lower 2's   Thrombocytopenia -due to liver cirrhosis -monitor for signs of bleeding -restart warfarin on 10/22 per GI     Morbid obesity with BMI of 45.0-49.9, adult -outpatient lifestyle modifications -plausible obesity hypoventilation     Liver cirrhosis--decompensated -10/14/20 CT abd--nodular liver; Small volume abdominal ascites. Diffuse soft tissue anasarca -10/14/20 abd ultrasound shows cirrhosis with splenomegaly and perihepatic ascites. Findings are suggestive for portal hypertension. Portal venous system is patent with normal direction of flow.  -Low volume ascites insufficient for paracentesis - outpatient follow up with GI for cirrhosis care  -restarted IV lasix     Gout -no current complaints -continue allopurinol     History of colon cancer -previous colonoscopy with several polyps removed - frequent polyps seen on 10/15 - repeat colon survey recommended in 3 months    Cor Pulmonale/Acute on chronic diastolic CHF -significant right ventricle dilation with evidence of pulmonary artery hypertension -10/16/20 Echo EF 55-60%, no WMA, IV septum flattened; severe enlarged RV -spiro 50 mg daily>>hold due to risking K -restart sildenafil 20 mg tid -restarted IV lasix   CKD  3b -baseline creatinine 1.8-2.2 -monitor with diuresis     OSA (obstructive sleep apnea) -uses CPAP at home, order CPAP in hospital    Chronic Respiratory Failure with Hypoxia -chronically on 2L at home     Chronic Atrial fibrillation with slow ventricular response -currently in Afib, rate in the 80's -was anticoagulated INR 3.6 -Stopped warfarin temporarily due to gi bleed, plan to restart around 10/24/20  -restart  low dose metoprolol     DM (diabetes mellitus), type 2 with renal complications  81/44/81 E5U--3.1 -continue novolog sliding scale -decrease semglee to 7     MGUS (monoclonal gammopathy of unknown significance)  - outpatient  follow up  -Previously followed by hem/onc but has not seen in several years     Hyperlipidemia associated with type 2 diabetes mellitus  -continue atorvastatin     Pulmonary hypertension -continue sildenafil         Status is: Inpatient   Remains inpatient appropriate because: severity of illness requiring PRBC and FFP transfusions               Family Communication:  no Family at bedside   Consultants:  GI   Code Status:  FULL    DVT Prophylaxis:  warfarin on hold     Procedures: As Listed in Progress Note Above   Antibiotics: None     Subjective:  Patient denies fevers, chills, headache, chest pain, dyspnea, nausea, vomiting, diarrhea, abdominal pain, dysuria, hematuria, hematochezia, and melena.  Objective: Vitals:   10/22/20 2125 10/23/20 0258 10/23/20 0500 10/23/20 1359  BP: 105/74 108/78  110/72  Pulse: 93 83  80  Resp: 17 16  17   Temp: 98 F (36.7 C) 98 F (36.7 C)  98.3 F (36.8 C)  TempSrc: Oral Oral  Oral  SpO2: 96% 97%  97%  Weight:   123.5 kg   Height:        Intake/Output Summary (Last 24 hours) at 10/23/2020 1805 Last data filed at 10/23/2020 1300 Gross per 24 hour  Intake 840 ml  Output --  Net 840 ml   Weight change: -1.4 kg Exam:  General:  Pt is alert, follows commands appropriately, not in acute distress HEENT: No icterus, No thrush, No neck mass, Bright/AT Cardiovascular: RRR, S1/S2, no rubs, no gallops Respiratory: bibasilar crackles. No wheeze Abdomen: Soft/+BS, non tender, non distended, no guarding Extremities: 1 + LE edema, No lymphangitis, No petechiae, No rashes, no synovitis   Data Reviewed: I have personally reviewed following labs and imaging studies Basic Metabolic Panel: Recent Labs  Lab 10/19/20 0038 10/20/20 0656 10/21/20 0628 10/22/20 0442 10/23/20 0521  NA 134* 137 136 136 137  K 4.3 5.0 4.4 5.0 4.9  CL 103 105 105 105 104  CO2 25 27 25 25 26   GLUCOSE 162* 142* 103* 114* 116*  BUN 47* 47* 44*  41* 44*  CREATININE 2.22* 2.35* 1.86* 1.95* 2.04*  CALCIUM 8.5* 8.6* 8.4* 8.8* 8.6*  MG  --   --  2.4  --   --    Liver Function Tests: Recent Labs  Lab 10/17/20 0428 10/18/20 0532 10/19/20 0038 10/21/20 0628 10/22/20 0442 10/23/20 0521  AST 17 17  --  15 16 13*  ALT 20 16  --  12 12 11   ALKPHOS 58 60  --  55 64 65  BILITOT 0.9 0.8  --  1.0 0.9 1.1  PROT 6.3* 6.0*  --  6.0* 6.5 5.9*  ALBUMIN 3.2* 3.1* 3.3* 3.1* 3.3* 3.1*   No results for input(s): LIPASE, AMYLASE in the last 168 hours. No results for input(s): AMMONIA in the last 168 hours. Coagulation Profile: Recent Labs  Lab 10/17/20 0428 10/19/20 0038 10/20/20 0815 10/21/20 0628  INR 1.4* 1.5* 1.5* 1.5*   CBC: Recent Labs  Lab 10/19/20 0038 10/20/20 0655 10/20/20 1614 10/21/20 0628 10/22/20 0442 10/23/20 0521  WBC 4.3 4.7  --  4.3 4.6 4.0  HGB 8.5* 8.2* 7.7* 8.5* 8.6* 8.3*  HCT 30.1* 29.3* 27.6* 29.4* 30.4* 28.8*  MCV 92.3 93.9  --  93.0 94.1 92.6  PLT 130* 108*  --  93* 103* 88*   Cardiac Enzymes: No results for input(s): CKTOTAL, CKMB, CKMBINDEX, TROPONINI in the last 168 hours. BNP: Invalid input(s): POCBNP CBG: Recent Labs  Lab 10/22/20 1625 10/22/20 2123 10/23/20 0808 10/23/20 1117 10/23/20 1614  GLUCAP 103* 142* 113* 118* 139*   HbA1C: No results for input(s): HGBA1C in the last 72 hours. Urine analysis:    Component Value Date/Time   COLORURINE YELLOW 06/21/2020 1059   APPEARANCEUR CLEAR 06/21/2020 1059   LABSPEC 1.020 06/21/2020 1059   PHURINE 5.5 06/21/2020 1059   GLUCOSEU NEGATIVE 06/21/2020 1059   HGBUR LARGE (A) 06/21/2020 1059   BILIRUBINUR NEGATIVE 06/21/2020 1059   KETONESUR NEGATIVE 06/21/2020 1059   PROTEINUR 100 (A) 06/21/2020 1059   UROBILINOGEN 0.2 05/16/2014 0822   NITRITE NEGATIVE 06/21/2020 1059   LEUKOCYTESUR NEGATIVE 06/21/2020 1059   Sepsis Labs: @LABRCNTIP (procalcitonin:4,lacticidven:4) ) Recent Results (from the past 240 hour(s))  Resp Panel by RT-PCR  (Flu A&B, Covid) Nasopharyngeal Swab     Status: None   Collection Time: 10/14/20  2:15 PM   Specimen: Nasopharyngeal Swab; Nasopharyngeal(NP) swabs in vial transport medium  Result Value Ref Range Status   SARS Coronavirus 2 by RT PCR NEGATIVE NEGATIVE Final    Comment: (NOTE) SARS-CoV-2 target nucleic acids are NOT DETECTED.  The SARS-CoV-2 RNA is generally detectable in upper respiratory specimens during the acute phase of infection. The lowest concentration of SARS-CoV-2 viral copies this assay can detect is 138 copies/mL. A negative result does not preclude SARS-Cov-2 infection and should not be used as the sole basis for treatment or other patient management decisions. A negative result may occur with  improper specimen collection/handling, submission of specimen other than nasopharyngeal swab, presence of viral mutation(s) within the areas targeted by this assay, and inadequate number of viral copies(<138 copies/mL). A negative result must be combined with clinical observations, patient history, and epidemiological information. The expected result is Negative.  Fact Sheet for Patients:  EntrepreneurPulse.com.au  Fact Sheet for Healthcare Providers:  IncredibleEmployment.be  This test is no t yet approved or cleared by the [place] FDA and  has been authorized for detection and/or diagnosis of SARS-CoV-2 by FDA under an Emergency Use Authorization (EUA). This EUA will remain  in effect (meaning this test can be used) for the duration of the COVID-19 declaration under Section 564(b)(1) of the Act, 21 U.S.C.section 360bbb-3(b)(1), unless the authorization is terminated  or revoked sooner.       Influenza A by PCR NEGATIVE NEGATIVE Final   Influenza B by PCR NEGATIVE NEGATIVE Final    Comment: (NOTE) The Xpert Xpress SARS-CoV-2/FLU/RSV plus assay is intended as an aid in the diagnosis of influenza from Nasopharyngeal swab specimens  and should not be used as a sole basis for treatment. Nasal washings and aspirates are unacceptable for Xpert Xpress SARS-CoV-2/FLU/RSV testing.  Fact Sheet for Patients: EntrepreneurPulse.com.au  Fact Sheet for Healthcare Providers: IncredibleEmployment.be  This test is not yet approved or cleared by the [place] FDA and has been authorized for detection and/or diagnosis of SARS-CoV-2 by FDA under an Emergency Use Authorization (EUA). This EUA will remain in effect (meaning this test can be used) for the duration of the COVID-19 declaration under Section 564(b)(1) of the Act, 21 U.S.C. section 360bbb-3(b)(1), unless the authorization is terminated or revoked.  Performed  at [facility], [address], [place], [place] [address]      Scheduled Meds:  sodium chloride   Intravenous Once   sodium chloride   Intravenous Once   allopurinol  300 mg Oral Daily   atorvastatin  20 mg Oral QPM   ferrous sulfate  325 mg Oral Q breakfast   insulin aspart  0-9 Units Subcutaneous TID WC   insulin glargine-yfgn  7 Units Subcutaneous Q2200   metoprolol succinate  25 mg Oral Daily   pantoprazole  40 mg Oral BID AC   sildenafil  20 mg Oral TID   [START ON 10/24/2020] torsemide  60 mg Oral BID   Continuous Infusions:  Procedures/Studies: CT Abdomen Pelvis Wo Contrast  Result Date: 10/14/2020 CLINICAL DATA:  Evaluate for infection EXAM: CT CHEST, ABDOMEN AND PELVIS WITHOUT CONTRAST TECHNIQUE: Multidetector CT imaging of the chest, abdomen and pelvis was performed following the standard protocol without IV contrast. COMPARISON:  CT abdomen and pelvis dated December 26, 2018 FINDINGS: CT CHEST FINDINGS Cardiovascular: Cardiomegaly. No pericardial effusion. Three-vessel coronary artery calcifications. Mitral annular calcifications. Atherosclerotic disease of the thoracic aorta. Mediastinum/Nodes: Mildly enlarged mediastinal lymph nodes which are likely  reactive. Reference right pulmonary ligament lymph node measuring 1.4 cm in short axis on series 2, image 31. Esophagus and thyroid are unremarkable. Lungs/Pleura: Central airways are patent. Small bilateral pleural effusions and atelectasis. Ground-glass opacities of the bilateral upper lungs. Solid pulmonary nodule of the right lower lobe measuring 3 mm in mean diameter on series 3, image 78. Musculoskeletal: No chest wall mass or suspicious bone lesions identified. CT ABDOMEN PELVIS FINDINGS Hepatobiliary: Nodular liver contour. Gallbladder is unremarkable. No biliary ductal dilation. Pancreas: Mild peripancreatic fat stranding, likely due to volume status given normal serum lipase. Otherwise unremarkable. Spleen: Unremarkable. Adrenals/Urinary Tract: Bilateral adrenal glands are unremarkable. Cortical thinning of the bilateral kidneys. No evidence of hydronephrosis or nephrolithiasis. Distended urinary bladder. Stomach/Bowel: Stomach is within normal limits. Prior right hemi colectomy. No evidence of bowel wall thickening, distention, or inflammatory changes. Vascular/Lymphatic: Aortic atherosclerosis. No enlarged abdominal or pelvic lymph nodes. Reproductive: Prostate is unremarkable. Other: Small volume abdominal ascites. Diffuse soft tissue anasarca. Musculoskeletal: No acute or significant osseous findings. IMPRESSION: Mild bilateral ground-glass opacities which are most prominent in the upper lungs, favor pulmonary edema, although an infectious process could appear similar. Small bilateral pleural effusions, small volume abdominal ascites, and soft tissue anasarca, likely due to volume status. Nodular liver contour, suggestive of cirrhosis. Solid pulmonary nodule the right lower lobe measuring 3 mm. No follow-up needed if patient is low-risk. Non-contrast chest CT can be considered in 12 months if patient is high-risk. This recommendation follows the consensus statement: Guidelines for Management of  Incidental Pulmonary Nodules Detected on CT Images: From the Fleischner Society 2017; Radiology 2017; 284:228-243. Aortic Atherosclerosis (ICD10-I70.0). Electronically Signed   By: Yetta Glassman M.D.   On: 10/14/2020 13:11   CT Chest Wo Contrast  Result Date: 10/14/2020 CLINICAL DATA:  Evaluate for infection EXAM: CT CHEST, ABDOMEN AND PELVIS WITHOUT CONTRAST TECHNIQUE: Multidetector CT imaging of the chest, abdomen and pelvis was performed following the standard protocol without IV contrast. COMPARISON:  CT abdomen and pelvis dated December 26, 2018 FINDINGS: CT CHEST FINDINGS Cardiovascular: Cardiomegaly. No pericardial effusion. Three-vessel coronary artery calcifications. Mitral annular calcifications. Atherosclerotic disease of the thoracic aorta. Mediastinum/Nodes: Mildly enlarged mediastinal lymph nodes which are likely reactive. Reference right pulmonary ligament lymph node measuring 1.4 cm in short axis on series 2, image 31.  Esophagus and thyroid are unremarkable. Lungs/Pleura: Central airways are patent. Small bilateral pleural effusions and atelectasis. Ground-glass opacities of the bilateral upper lungs. Solid pulmonary nodule of the right lower lobe measuring 3 mm in mean diameter on series 3, image 78. Musculoskeletal: No chest wall mass or suspicious bone lesions identified. CT ABDOMEN PELVIS FINDINGS Hepatobiliary: Nodular liver contour. Gallbladder is unremarkable. No biliary ductal dilation. Pancreas: Mild peripancreatic fat stranding, likely due to volume status given normal serum lipase. Otherwise unremarkable. Spleen: Unremarkable. Adrenals/Urinary Tract: Bilateral adrenal glands are unremarkable. Cortical thinning of the bilateral kidneys. No evidence of hydronephrosis or nephrolithiasis. Distended urinary bladder. Stomach/Bowel: Stomach is within normal limits. Prior right hemi colectomy. No evidence of bowel wall thickening, distention, or inflammatory changes. Vascular/Lymphatic:  Aortic atherosclerosis. No enlarged abdominal or pelvic lymph nodes. Reproductive: Prostate is unremarkable. Other: Small volume abdominal ascites. Diffuse soft tissue anasarca. Musculoskeletal: No acute or significant osseous findings. IMPRESSION: Mild bilateral ground-glass opacities which are most prominent in the upper lungs, favor pulmonary edema, although an infectious process could appear similar. Small bilateral pleural effusions, small volume abdominal ascites, and soft tissue anasarca, likely due to volume status. Nodular liver contour, suggestive of cirrhosis. Solid pulmonary nodule the right lower lobe measuring 3 mm. No follow-up needed if patient is low-risk. Non-contrast chest CT can be considered in 12 months if patient is high-risk. This recommendation follows the consensus statement: Guidelines for Management of Incidental Pulmonary Nodules Detected on CT Images: From the Fleischner Society 2017; Radiology 2017; 284:228-243. Aortic Atherosclerosis (ICD10-I70.0). Electronically Signed   By: Yetta Glassman M.D.   On: 10/14/2020 13:11   DG Chest Port 1 View  Result Date: 10/15/2020 CLINICAL DATA:  Pulmonary edema. EXAM: PORTABLE CHEST 1 VIEW COMPARISON:  October 14, 2020. FINDINGS: Stable cardiomegaly. Mild central pulmonary vascular congestion is noted. Bibasilar pulmonary edema or infiltrate is noted. Small bilateral pleural effusions are noted. Bony thorax is unremarkable. IMPRESSION: Stable cardiomegaly with mild central pulmonary vascular congestion. Bibasilar pulmonary edema or infiltrates are noted. Small bilateral pleural effusions are noted. Electronically Signed   By: Marijo Conception M.D.   On: 10/15/2020 08:10   DG Chest Port 1 View  Result Date: 10/14/2020 CLINICAL DATA:  Cough and chest pain. EXAM: PORTABLE CHEST 1 VIEW COMPARISON:  06/24/2020 FINDINGS: Stable cardiac enlargement. Aortic atherosclerosis. Central pulmonary vascular congestion is noted as before. New asymmetric  airspace opacity is identified within the right upper lobe. Chronic pleural thickening along the lateral left base is again seen. IMPRESSION: 1. New right upper lobe airspace opacity compatible with pneumonia. 2. Cardiac enlargement and pulmonary vascular congestion. Electronically Signed   By: Kerby Moors M.D.   On: 10/14/2020 11:34   ECHOCARDIOGRAM COMPLETE  Result Date: 10/16/2020    ECHOCARDIOGRAM REPORT   Patient Name:   BOSTYN BOGIE Date of Exam: 10/16/2020 Medical Rec #:  [id number]      Height:       64.0 in Accession #:    [id number]     Weight:       274.9 lb Date of Birth:  Jan 31, 1950      BSA:          2.240 m Patient Age:    32 years       BP:           115/74 mmHg Patient Gender: M              HR:  79 bpm. Exam Location:  [facility] Procedure: 2D Echo, Cardiac Doppler and Color Doppler Indications:    CHF  History:        Patient has prior history of Echocardiogram examinations, most                 recent 06/21/2020. CHF, Arrythmias:Atrial Fibrillation,                 Signs/Symptoms:Syncope; Risk Factors:Hypertension, Diabetes and                 Dyslipidemia. Right Heart Failure, Nonischemic cardiomyopathy,                 Morbid obesity.  Sonographer:    Wenda Low Referring Phys: [place]  1. Left ventricular ejection fraction, by estimation, is 55 to 60%. The left ventricle has normal function. The left ventricle has no regional wall motion abnormalities. There is moderate concentric left ventricular hypertrophy. Left ventricular diastolic function could not be evaluated. There is the interventricular septum is flattened in systole and diastole, consistent with right ventricular pressure and volume overload.  2. Right ventricular systolic function is moderately reduced. The right ventricular size is severely enlarged.  3. Left atrial size was moderately dilated.  4. Right atrial size was severely dilated.  5. The mitral valve is degenerative.  Mild mitral valve regurgitation. No evidence of mitral stenosis.  6. The aortic valve is tricuspid. There is moderate calcification of the aortic valve. There is moderate thickening of the aortic valve. Aortic valve regurgitation is not visualized. Mild to moderate aortic valve sclerosis/calcification is present, without any evidence of aortic stenosis. Comparison(s): No significant change from prior study. Conclusion(s)/Recommendation(s): Findings consistent with Cor Pulmonale. FINDINGS  Left Ventricle: Left ventricular ejection fraction, by estimation, is 55 to 60%. The left ventricle has normal function. The left ventricle has no regional wall motion abnormalities. Definity contrast agent was given IV to delineate the left ventricular  endocardial borders. The left ventricular internal cavity size was normal in size. There is moderate concentric left ventricular hypertrophy. The interventricular septum is flattened in systole and diastole, consistent with right ventricular pressure and volume overload. Left ventricular diastolic function could not be evaluated due to atrial fibrillation. Left ventricular diastolic function could not be evaluated. Right Ventricle: The right ventricular size is severely enlarged. No increase in right ventricular wall thickness. Right ventricular systolic function is moderately reduced. Left Atrium: Left atrial size was moderately dilated. Right Atrium: Right atrial size was severely dilated. Pericardium: Trivial pericardial effusion is present. Mitral Valve: The mitral valve is degenerative in appearance. Mild to moderate mitral annular calcification. Mild mitral valve regurgitation. No evidence of mitral valve stenosis. MV peak gradient, 10.0 mmHg. The mean mitral valve gradient is 2.0 mmHg. Tricuspid Valve: The tricuspid valve is grossly normal. Tricuspid valve regurgitation is mild . No evidence of tricuspid stenosis. Aortic Valve: The aortic valve is tricuspid. There is  moderate calcification of the aortic valve. There is moderate thickening of the aortic valve. Aortic valve regurgitation is not visualized. Mild to moderate aortic valve sclerosis/calcification is present, without any evidence of aortic stenosis. Aortic valve mean gradient measures 4.0 mmHg. Aortic valve peak gradient measures 9.0 mmHg. Aortic valve area, by VTI measures 2.01 cm. Pulmonic Valve: The pulmonic valve was grossly normal. Pulmonic valve regurgitation is not visualized. No evidence of pulmonic stenosis. Aorta: The aortic root and ascending aorta are structurally normal, with no evidence of dilitation. Venous: The  inferior vena cava was not well visualized. IAS/Shunts: The atrial septum is grossly normal.  LEFT VENTRICLE PLAX 2D LVIDd:         5.50 cm   Diastology LVIDs:         3.50 cm   LV e' medial:    6.31 cm/s LV PW:         1.30 cm   LV E/e' medial:  21.6 LV IVS:        1.50 cm   LV e' lateral:   9.90 cm/s LVOT diam:     2.00 cm   LV E/e' lateral: 13.7 LV SV:         58 LV SV Index:   26 LVOT Area:     3.14 cm  RIGHT VENTRICLE RV Basal diam:  4.70 cm RV Mid diam:    3.90 cm RV S prime:     10.10 cm/s TAPSE (M-mode): 1.3 cm LEFT ATRIUM              Index        RIGHT ATRIUM           Index LA diam:        5.70 cm  2.55 cm/m   RA Area:     30.50 cm LA Vol (A2C):   137.0 ml 61.17 ml/m  RA Volume:   121.00 ml 54.03 ml/m LA Vol (A4C):   95.4 ml  42.60 ml/m LA Biplane Vol: 120.0 ml 53.58 ml/m  AORTIC VALVE                    PULMONIC VALVE AV Area (Vmax):    1.87 cm     PV Vmax:       0.57 m/s AV Area (Vmean):   2.03 cm     PV Peak grad:  1.3 mmHg AV Area (VTI):     2.01 cm AV Vmax:           150.00 cm/s AV Vmean:          96.900 cm/s AV VTI:            0.289 m AV Peak Grad:      9.0 mmHg AV Mean Grad:      4.0 mmHg LVOT Vmax:         89.20 cm/s LVOT Vmean:        62.600 cm/s LVOT VTI:          0.185 m LVOT/AV VTI ratio: 0.64  AORTA Ao Root diam: 3.20 cm Ao Asc diam:  3.20 cm MITRAL VALVE                 TRICUSPID VALVE MV Area (PHT): 3.61 cm     TR Peak grad:   74.3 mmHg MV Area VTI:   1.74 cm     TR Vmax:        431.00 cm/s MV Peak grad:  10.0 mmHg MV Mean grad:  2.0 mmHg     SHUNTS MV Vmax:       1.58 m/s     Systemic VTI:  0.18 m MV Vmean:      46.3 cm/s    Systemic Diam: 2.00 cm MV Decel Time: 210 msec MV E velocity: 136.00 cm/s MV A velocity: 42.70 cm/s MV E/A ratio:  3.19 Eleonore Chiquito MD Electronically signed by Eleonore Chiquito MD Signature Date/Time: 10/16/2020/4:13:15 PM    Final    [place] ASCITES (ABDOMEN LIMITED)  Result  Date: 10/14/2020 CLINICAL DATA:  Abdominal distension question ascites. History CHF, cardiomyopathy, coronary artery disease, colon cancer, diabetes mellitus, hypertension EXAM: LIMITED ABDOMEN ULTRASOUND FOR ASCITES TECHNIQUE: Limited ultrasound survey for ascites was performed in all four abdominal quadrants. COMPARISON:  CT abdomen and pelvis 10/14/2020 FINDINGS: Small collection of ascites in LEFT lower quadrant, containing bowel loops. No other significant ascites seen. Volume of ascites is insufficient for paracentesis. IMPRESSION: Low volume ascites insufficient for paracentesis. Electronically Signed   By: Lavonia Dana M.D.   On: 10/14/2020 15:49   US LIVER DOPPLER  Result Date: 10/15/2020 CLINICAL DATA:  Cirrhosis. EXAM: DUPLEX ULTRASOUND OF LIVER TECHNIQUE: Color and duplex Doppler ultrasound was performed to evaluate the hepatic in-flow and out-flow vessels. COMPARISON:  10/14/2020 and CT 10/14/2020 FINDINGS: Liver: Nodular contour compatible with cirrhosis. Perihepatic ascites. Main Portal Vein size: 1.3 cm Portal Vein Velocities Main Prox:  54 cm/sec Main Mid: 47 cm/sec Main Dist:  24 cm/sec Right: 42 cm/sec Left: 27 cm/sec Hepatic Vein Velocities Right:  64 cm/sec Middle:  59 cm/sec Left:  87 cm/sec IVC: Present and patent with normal respiratory phasicity. Hepatic Artery Velocity:  95 cm/sec Splenic Vein Velocity:  23 cm/sec Spleen: 17.1 cm x 18.7 cm x 7.3 cm  with a total volume of 1229 cm^3 (411 cm^3 is upper limit normal) Portal Vein Occlusion/Thrombus: No Splenic Vein Occlusion/Thrombus: No Ascites: Present Varices: Limited evaluation Normal hepatopetal flow in the portal veins. Normal hepatofugal flow in the hepatic veins. IMPRESSION: 1. Cirrhosis with splenomegaly and perihepatic ascites. Findings are suggestive for portal hypertension. 2. Portal venous system is patent with normal direction of flow. Electronically Signed   By: Markus Daft M.D.   On: 10/15/2020 11:43    Orson Eva, DO  [facility]  If 7PM-7AM, please contact night-coverage [email or web address] Password [email or web address] 10/23/2020, 6:05 PM   LOS: 9 days

## 2020-10-23 NOTE — TOC Progression Note (Signed)
Transition of Care [facility]) - Progression Note    Patient Details  Name: William Flores MRN: [id number] Date of Birth: Oct 16, 1950  [facility] CM/SW Contact  Shade Flood, LCSW Phone Number: 10/23/2020, 12:41 PM  Clinical Narrative:     TOC following. Reviewed bed offers with pt and he selects [facility]. MD anticipating dc tomorrow. Updated Debbie at [place]. Weekend TOC will follow.  Expected Discharge Plan: [place] Barriers to Discharge: Continued Medical Work up  Expected Discharge Plan and Services Expected Discharge Plan: [place] In-house Referral: Clinical Social Work   Post Acute Care Choice: [place] Living arrangements for the past 2 months: Single Family Home                 DME Arranged: N/A         HH Arranged: RN, PT [place] Agency: [place] ([facility]) Date [place]: 10/15/20 Time [place]: [place] Representative spoke with at [place]: [place] ([place]) Interventions    Readmission Risk Interventions Readmission Risk Prevention Plan 10/15/2020 04/15/2019  Transportation Screening Complete Complete  PCP or Specialist Appt within 3-5 Days - Complete  HRI or [place] Complete Complete  Social Work Consult for [place] Planning/Counseling Complete Complete  Palliative Care Screening Not Applicable Not Applicable  Medication Review [employer] photographer) Complete Complete  Some recent data might be hidden

## 2020-10-24 DIAGNOSIS — I5023 Acute on chronic systolic (congestive) heart failure: Secondary | ICD-10-CM | POA: Diagnosis not present

## 2020-10-24 DIAGNOSIS — K7460 Unspecified cirrhosis of liver: Secondary | ICD-10-CM | POA: Diagnosis not present

## 2020-10-24 DIAGNOSIS — N1832 Chronic kidney disease, stage 3b: Secondary | ICD-10-CM | POA: Diagnosis not present

## 2020-10-24 DIAGNOSIS — I2781 Cor pulmonale (chronic): Secondary | ICD-10-CM | POA: Diagnosis not present

## 2020-10-24 DIAGNOSIS — R0689 Other abnormalities of breathing: Secondary | ICD-10-CM | POA: Diagnosis not present

## 2020-10-24 DIAGNOSIS — K219 Gastro-esophageal reflux disease without esophagitis: Secondary | ICD-10-CM | POA: Diagnosis not present

## 2020-10-24 DIAGNOSIS — E1169 Type 2 diabetes mellitus with other specified complication: Secondary | ICD-10-CM | POA: Diagnosis not present

## 2020-10-24 DIAGNOSIS — I4891 Unspecified atrial fibrillation: Secondary | ICD-10-CM | POA: Diagnosis not present

## 2020-10-24 DIAGNOSIS — Z743 Need for continuous supervision: Secondary | ICD-10-CM | POA: Diagnosis not present

## 2020-10-24 DIAGNOSIS — D638 Anemia in other chronic diseases classified elsewhere: Secondary | ICD-10-CM | POA: Diagnosis present

## 2020-10-24 DIAGNOSIS — Z6841 Body Mass Index (BMI) 40.0 and over, adult: Secondary | ICD-10-CM | POA: Diagnosis not present

## 2020-10-24 DIAGNOSIS — I517 Cardiomegaly: Secondary | ICD-10-CM | POA: Diagnosis not present

## 2020-10-24 DIAGNOSIS — J9601 Acute respiratory failure with hypoxia: Secondary | ICD-10-CM | POA: Diagnosis not present

## 2020-10-24 DIAGNOSIS — Z515 Encounter for palliative care: Secondary | ICD-10-CM | POA: Diagnosis not present

## 2020-10-24 DIAGNOSIS — N186 End stage renal disease: Secondary | ICD-10-CM | POA: Diagnosis not present

## 2020-10-24 DIAGNOSIS — I482 Chronic atrial fibrillation, unspecified: Secondary | ICD-10-CM | POA: Diagnosis not present

## 2020-10-24 DIAGNOSIS — I272 Pulmonary hypertension, unspecified: Secondary | ICD-10-CM

## 2020-10-24 DIAGNOSIS — I110 Hypertensive heart disease with heart failure: Secondary | ICD-10-CM | POA: Diagnosis not present

## 2020-10-24 DIAGNOSIS — D509 Iron deficiency anemia, unspecified: Secondary | ICD-10-CM | POA: Diagnosis not present

## 2020-10-24 DIAGNOSIS — K7581 Nonalcoholic steatohepatitis (NASH): Secondary | ICD-10-CM | POA: Diagnosis not present

## 2020-10-24 DIAGNOSIS — I50812 Chronic right heart failure: Secondary | ICD-10-CM | POA: Diagnosis not present

## 2020-10-24 DIAGNOSIS — J9621 Acute and chronic respiratory failure with hypoxia: Secondary | ICD-10-CM | POA: Diagnosis not present

## 2020-10-24 DIAGNOSIS — R0902 Hypoxemia: Secondary | ICD-10-CM | POA: Diagnosis not present

## 2020-10-24 DIAGNOSIS — Z20822 Contact with and (suspected) exposure to covid-19: Secondary | ICD-10-CM | POA: Diagnosis present

## 2020-10-24 DIAGNOSIS — G4733 Obstructive sleep apnea (adult) (pediatric): Secondary | ICD-10-CM | POA: Diagnosis not present

## 2020-10-24 DIAGNOSIS — I5031 Acute diastolic (congestive) heart failure: Secondary | ICD-10-CM | POA: Diagnosis not present

## 2020-10-24 DIAGNOSIS — J9611 Chronic respiratory failure with hypoxia: Secondary | ICD-10-CM | POA: Diagnosis not present

## 2020-10-24 DIAGNOSIS — K922 Gastrointestinal hemorrhage, unspecified: Secondary | ICD-10-CM | POA: Diagnosis not present

## 2020-10-24 DIAGNOSIS — K7469 Other cirrhosis of liver: Secondary | ICD-10-CM | POA: Diagnosis not present

## 2020-10-24 DIAGNOSIS — E782 Mixed hyperlipidemia: Secondary | ICD-10-CM | POA: Diagnosis not present

## 2020-10-24 DIAGNOSIS — D696 Thrombocytopenia, unspecified: Secondary | ICD-10-CM | POA: Diagnosis not present

## 2020-10-24 DIAGNOSIS — R5381 Other malaise: Secondary | ICD-10-CM | POA: Diagnosis not present

## 2020-10-24 DIAGNOSIS — K7290 Hepatic failure, unspecified without coma: Secondary | ICD-10-CM | POA: Diagnosis not present

## 2020-10-24 DIAGNOSIS — R195 Other fecal abnormalities: Secondary | ICD-10-CM | POA: Diagnosis not present

## 2020-10-24 DIAGNOSIS — R0602 Shortness of breath: Secondary | ICD-10-CM | POA: Diagnosis not present

## 2020-10-24 DIAGNOSIS — I5043 Acute on chronic combined systolic (congestive) and diastolic (congestive) heart failure: Secondary | ICD-10-CM | POA: Diagnosis present

## 2020-10-24 DIAGNOSIS — I2729 Other secondary pulmonary hypertension: Secondary | ICD-10-CM | POA: Diagnosis present

## 2020-10-24 DIAGNOSIS — D472 Monoclonal gammopathy: Secondary | ICD-10-CM | POA: Diagnosis not present

## 2020-10-24 DIAGNOSIS — R609 Edema, unspecified: Secondary | ICD-10-CM | POA: Diagnosis not present

## 2020-10-24 DIAGNOSIS — I509 Heart failure, unspecified: Secondary | ICD-10-CM | POA: Diagnosis not present

## 2020-10-24 DIAGNOSIS — R279 Unspecified lack of coordination: Secondary | ICD-10-CM | POA: Diagnosis not present

## 2020-10-24 DIAGNOSIS — D62 Acute posthemorrhagic anemia: Secondary | ICD-10-CM | POA: Diagnosis not present

## 2020-10-24 DIAGNOSIS — C9000 Multiple myeloma not having achieved remission: Secondary | ICD-10-CM | POA: Diagnosis present

## 2020-10-24 DIAGNOSIS — R188 Other ascites: Secondary | ICD-10-CM | POA: Diagnosis not present

## 2020-10-24 DIAGNOSIS — E1165 Type 2 diabetes mellitus with hyperglycemia: Secondary | ICD-10-CM | POA: Diagnosis not present

## 2020-10-24 DIAGNOSIS — M109 Gout, unspecified: Secondary | ICD-10-CM | POA: Diagnosis not present

## 2020-10-24 DIAGNOSIS — I50813 Acute on chronic right heart failure: Secondary | ICD-10-CM

## 2020-10-24 DIAGNOSIS — E1122 Type 2 diabetes mellitus with diabetic chronic kidney disease: Secondary | ICD-10-CM | POA: Diagnosis not present

## 2020-10-24 DIAGNOSIS — M6281 Muscle weakness (generalized): Secondary | ICD-10-CM | POA: Diagnosis not present

## 2020-10-24 DIAGNOSIS — Z79899 Other long term (current) drug therapy: Secondary | ICD-10-CM | POA: Diagnosis not present

## 2020-10-24 DIAGNOSIS — I083 Combined rheumatic disorders of mitral, aortic and tricuspid valves: Secondary | ICD-10-CM | POA: Diagnosis present

## 2020-10-24 DIAGNOSIS — R570 Cardiogenic shock: Secondary | ICD-10-CM | POA: Diagnosis not present

## 2020-10-24 DIAGNOSIS — Z5181 Encounter for therapeutic drug level monitoring: Secondary | ICD-10-CM | POA: Diagnosis not present

## 2020-10-24 DIAGNOSIS — I700 Atherosclerosis of aorta: Secondary | ICD-10-CM | POA: Diagnosis present

## 2020-10-24 DIAGNOSIS — R0600 Dyspnea, unspecified: Secondary | ICD-10-CM | POA: Diagnosis not present

## 2020-10-24 DIAGNOSIS — Z7901 Long term (current) use of anticoagulants: Secondary | ICD-10-CM | POA: Diagnosis not present

## 2020-10-24 DIAGNOSIS — I50810 Right heart failure, unspecified: Secondary | ICD-10-CM | POA: Diagnosis not present

## 2020-10-24 DIAGNOSIS — J9622 Acute and chronic respiratory failure with hypercapnia: Secondary | ICD-10-CM | POA: Diagnosis present

## 2020-10-24 DIAGNOSIS — N183 Chronic kidney disease, stage 3 unspecified: Secondary | ICD-10-CM | POA: Diagnosis not present

## 2020-10-24 DIAGNOSIS — R2689 Other abnormalities of gait and mobility: Secondary | ICD-10-CM | POA: Diagnosis not present

## 2020-10-24 DIAGNOSIS — N179 Acute kidney failure, unspecified: Secondary | ICD-10-CM | POA: Diagnosis not present

## 2020-10-24 DIAGNOSIS — I50811 Acute right heart failure: Secondary | ICD-10-CM | POA: Diagnosis not present

## 2020-10-24 DIAGNOSIS — R41841 Cognitive communication deficit: Secondary | ICD-10-CM | POA: Diagnosis not present

## 2020-10-24 DIAGNOSIS — R34 Anuria and oliguria: Secondary | ICD-10-CM | POA: Diagnosis not present

## 2020-10-24 DIAGNOSIS — J449 Chronic obstructive pulmonary disease, unspecified: Secondary | ICD-10-CM | POA: Diagnosis not present

## 2020-10-24 DIAGNOSIS — Z992 Dependence on renal dialysis: Secondary | ICD-10-CM | POA: Diagnosis not present

## 2020-10-24 DIAGNOSIS — R7989 Other specified abnormal findings of blood chemistry: Secondary | ICD-10-CM | POA: Diagnosis not present

## 2020-10-24 DIAGNOSIS — K7031 Alcoholic cirrhosis of liver with ascites: Secondary | ICD-10-CM | POA: Diagnosis not present

## 2020-10-24 DIAGNOSIS — Z7189 Other specified counseling: Secondary | ICD-10-CM | POA: Diagnosis not present

## 2020-10-24 DIAGNOSIS — J90 Pleural effusion, not elsewhere classified: Secondary | ICD-10-CM | POA: Diagnosis not present

## 2020-10-24 DIAGNOSIS — G4700 Insomnia, unspecified: Secondary | ICD-10-CM | POA: Diagnosis not present

## 2020-10-24 DIAGNOSIS — D61818 Other pancytopenia: Secondary | ICD-10-CM | POA: Diagnosis present

## 2020-10-24 DIAGNOSIS — I132 Hypertensive heart and chronic kidney disease with heart failure and with stage 5 chronic kidney disease, or end stage renal disease: Secondary | ICD-10-CM | POA: Diagnosis present

## 2020-10-24 DIAGNOSIS — E785 Hyperlipidemia, unspecified: Secondary | ICD-10-CM | POA: Diagnosis not present

## 2020-10-24 DIAGNOSIS — D6959 Other secondary thrombocytopenia: Secondary | ICD-10-CM | POA: Diagnosis present

## 2020-10-24 DIAGNOSIS — G9341 Metabolic encephalopathy: Secondary | ICD-10-CM | POA: Diagnosis present

## 2020-10-24 DIAGNOSIS — I5033 Acute on chronic diastolic (congestive) heart failure: Secondary | ICD-10-CM | POA: Diagnosis not present

## 2020-10-24 DIAGNOSIS — I10 Essential (primary) hypertension: Secondary | ICD-10-CM | POA: Diagnosis not present

## 2020-10-24 DIAGNOSIS — R069 Unspecified abnormalities of breathing: Secondary | ICD-10-CM | POA: Diagnosis not present

## 2020-10-24 DIAGNOSIS — I428 Other cardiomyopathies: Secondary | ICD-10-CM | POA: Diagnosis not present

## 2020-10-24 DIAGNOSIS — E119 Type 2 diabetes mellitus without complications: Secondary | ICD-10-CM | POA: Diagnosis not present

## 2020-10-24 DIAGNOSIS — E662 Morbid (severe) obesity with alveolar hypoventilation: Secondary | ICD-10-CM | POA: Diagnosis present

## 2020-10-24 LAB — CBC
HCT: 29.1 % — ABNORMAL LOW (ref 39.0–52.0)
Hemoglobin: 8.2 g/dL — ABNORMAL LOW (ref 13.0–17.0)
MCH: 26.3 pg (ref 26.0–34.0)
MCHC: 28.2 g/dL — ABNORMAL LOW (ref 30.0–36.0)
MCV: 93.3 fL (ref 80.0–100.0)
Platelets: 78 10*3/uL — ABNORMAL LOW (ref 150–400)
RBC: 3.12 MIL/uL — ABNORMAL LOW (ref 4.22–5.81)
RDW: 19.7 % — ABNORMAL HIGH (ref 11.5–15.5)
WBC: 3.6 10*3/uL — ABNORMAL LOW (ref 4.0–10.5)
nRBC: 0 % (ref 0.0–0.2)

## 2020-10-24 LAB — COMPREHENSIVE METABOLIC PANEL
ALT: 11 U/L (ref 0–44)
AST: 18 U/L (ref 15–41)
Albumin: 3 g/dL — ABNORMAL LOW (ref 3.5–5.0)
Alkaline Phosphatase: 67 U/L (ref 38–126)
Anion gap: 8 (ref 5–15)
BUN: 52 mg/dL — ABNORMAL HIGH (ref 8–23)
CO2: 28 mmol/L (ref 22–32)
Calcium: 8.5 mg/dL — ABNORMAL LOW (ref 8.9–10.3)
Chloride: 101 mmol/L (ref 98–111)
Creatinine, Ser: 2.29 mg/dL — ABNORMAL HIGH (ref 0.61–1.24)
GFR, Estimated: 30 mL/min — ABNORMAL LOW (ref >60)
Glucose, Bld: 149 mg/dL — ABNORMAL HIGH (ref 70–99)
Potassium: 4.9 mmol/L (ref 3.5–5.1)
Sodium: 137 mmol/L (ref 135–145)
Total Bilirubin: 0.7 mg/dL (ref 0.3–1.2)
Total Protein: 6 g/dL — ABNORMAL LOW (ref 6.5–8.1)

## 2020-10-24 LAB — GLUCOSE, CAPILLARY
Glucose-Capillary: 117 mg/dL — ABNORMAL HIGH (ref 70–99)
Glucose-Capillary: 134 mg/dL — ABNORMAL HIGH (ref 70–99)

## 2020-10-24 MED ORDER — METOPROLOL SUCCINATE ER 25 MG PO TB24: 25.0000 mg | ORAL_TABLET | Freq: Every day | ORAL | Status: DC

## 2020-10-24 MED ORDER — WARFARIN - PHYSICIAN DOSING INPATIENT: Freq: Every day | Status: DC

## 2020-10-24 MED ORDER — WARFARIN SODIUM 7.5 MG PO TABS: 7.5000 mg | ORAL_TABLET | Freq: Every day | ORAL | Status: DC

## 2020-10-24 NOTE — Discharge Summary (Signed)
Physician Discharge Summary  William Flores VFI:[id number] DOB: 21-Apr-1950 DOA: 10/14/2020  PCP: [name], [name], MD  Admit date: 10/14/2020 Discharge date: 10/24/2020  Admitted From: home Disposition:  SNF  Recommendations for Outpatient Follow-up:  Follow up with PCP in 1-2 weeks Please obtain BMP/CBC in one week Please check INR on 10/25 and adjust warfarin dose for INR 2.0-2.5   Discharge Condition: Stable CODE STATUS:FULL Diet recommendation: Low sodium, carb  modified   Brief/Interim Summary: William Flores is a medically complex gentleman who presents with multiple complaints including increasing generalized weakness and debility, increasing abdominal distention, uncontrolled acid reflux symptoms, shortness of breath, inability to lie recumbent, DOE, increasing edema in lower extremities, occasional black stools, and worsening LUQ abd pain.  He is followed by the heart failure team in [place].  He has pulmonary hypertension and is on sildenafil TID. He was found to be anemic, hemoglobin at 7.1 on admission 10/12,  5.8 on 10/13, requiring 2x PRBC, now 7.7 10/14. Warfarin was stopped, vitamin K given, IV lasix was given for his abdominal edema and small volume ascites. IV protonix bid has been continued. Pulmonary edema and pleural effusions noted 10/13 on CXR,  GI did colonoscopy 10/15 with findings of large rectal polyps with intermittent oozing from polyp, biopsies taken.  His warfarin was held until 10/24/20.  He did not have any further hematochezia and his Hgb stabilized.  He was noted to have anasarca due to decompensated liver cirrhosis.  He was started on IV lasix with improvement.  His d/c weight was 118.8 kg.  He was transitioned back to his home dose torsemide.  Discharge Diagnoses:  GI bleed /   Hematochezia -anemic 5.8 normocytic, 2x PRBC ordered 10/13 -hemoglobin stable at 8.2 on 10/16 which is reassuring   -GI did colonoscopy 10/15 with findings of large  rectal polyps with intermittent oozing from polyp, biopsies taken -pathology showed high grade dysplasia---will need repeat colonoscopy in 3 months - GI recommends restarting warfarin in 1 week (10/24/20) - 10/18--one additional unit PRBC (3 total for admit) and 2 FFP -Hgb 8.2 on day of d/c     Acute blood loss anemia -secondary to lower GI bleed, GI consulted, transfused, stable at this time - Hg remains reassuring at 8.6 -er GI transfuse for Hg<8.   - recheck CBC daily to follow Hgb during the hospitalization -baseline Hgb~9 -Hgb 8.2 on day of d/c     Anticoagulated on Coumadin -discontinued warfarin 10/12 -5 mg vitamin K given 10/12 -7.5 mg vitamin k  10/13 -Initial INR 3.6 on 10/12,  10/13 3.2, 10/14 1.8 - per GI can restart warfarin in 1 week (10/24/20) -plan for INR lower 2's   Thrombocytopenia -due to liver cirrhosis -monitor for signs of bleeding -restart warfarin on 10/22 per GI     Morbid obesity with BMI of 45.0-49.9, adult -outpatient lifestyle modifications -plausible obesity hypoventilation     Liver cirrhosis--decompensated -10/14/20 CT abd--nodular liver; Small volume abdominal ascites. Diffuse soft tissue anasarca -10/14/20 abd ultrasound shows cirrhosis with splenomegaly and perihepatic ascites. Findings are suggestive for portal hypertension. Portal venous system is patent with normal direction of flow.  -Low volume ascites insufficient for paracentesis - outpatient follow up with GI for cirrhosis care  -restarted IV lasix>>d/c with torsemide 60 mg bid -d/c weight 118.8 kg     Gout -no current complaints -continue allopurinol     History of colon cancer -previous colonoscopy with several polyps removed - frequent polyps seen on 10/15 -  repeat colon survey recommended in 3 months    Cor Pulmonale/Acute on chronic diastolic CHF -significant right ventricle dilation with evidence of pulmonary artery hypertension -10/16/20 Echo EF 55-60%, no WMA, IV  septum flattened; severe enlarged RV -spiro 50 mg daily>>hold due to risking K -restart sildenafil 20 mg tid -restarted IV lasix>>d/c with torsemide 60 mg bid   CKD  3b -baseline creatinine 1.8-2.2 -monitor with diuresis -serum creatinine 2.29 on day of d/c     OSA (obstructive sleep apnea) -uses CPAP at home, order CPAP in hospital    Chronic Respiratory Failure with Hypoxia -chronically on 2L at home     Chronic Atrial fibrillation with slow ventricular response -currently in Afib, rate in the 80's -was anticoagulated INR 3.6 -Stopped warfarin temporarily due to gi bleed, plan to restart around 10/24/20  -restart low dose metoprolol due to soft BPs     DM (diabetes mellitus), type 2 with renal complications  91/63/84 Y6Z--9.9 -continue novolog sliding scale -decrease semglee to 7 units during hospitalization     MGUS (monoclonal gammopathy of unknown significance)  - outpatient follow up  -Previously followed by hem/onc but has not seen in several years     Hyperlipidemia associated with type 2 diabetes mellitus  -continue atorvastatin     Pulmonary hypertension -continue sildenafil    Discharge Instructions   Allergies as of 10/24/2020       Reactions   Codeine    Headache        Medication List     STOP taking these medications    azelastine 0.05 % ophthalmic solution Commonly known as: OPTIVAR   Klor-Con M20 20 MEQ tablet Generic drug: potassium chloride SA       TAKE these medications    acetaminophen 325 MG tablet Commonly known as: TYLENOL Take 2 tablets (650 mg total) by mouth every 6 (six) hours as needed for mild pain (or Fever >/= 101).   allopurinol 300 MG tablet Commonly known as: ZYLOPRIM Take 1 tablet (300 mg total) by mouth daily.   atorvastatin 20 MG tablet Commonly known as: LIPITOR TAKE 1 TABLET BY MOUTH EVERY DAY What changed:  how much to take how to take this when to take this additional instructions    benzonatate 100 MG capsule Commonly known as: TESSALON Take 1 capsule (100 mg total) by mouth 2 (two) times daily as needed for cough.   Contour Next Test test strip Generic drug: glucose blood Test BS TID Dx E11.29   Farxiga 5 MG Tabs tablet Generic drug: dapagliflozin propanediol Take 5 mg by mouth daily.   ferrous sulfate 325 (65 FE) MG tablet Take 325 mg by mouth daily with breakfast.   gentamicin 0.3 % ophthalmic ointment Commonly known as: GARAMYCIN 3 (three) times daily.   Insulin Pen Needle 32G X 4 MM Misc Use to give insulin daily Dx E11.29   LANTUS SOLOSTAR [place] Inject into the skin. 16 units   metoprolol succinate 25 MG 24 hr tablet Commonly known as: TOPROL-XL Take 1 tablet (25 mg total) by mouth daily. Start taking on: October 25, 2020 What changed:  medication strength how much to take additional instructions   pantoprazole 40 MG tablet Commonly known as: PROTONIX TAKE 1 TABLET BY MOUTH EVERY DAY   polyethylene glycol 17 g packet Commonly known as: MIRALAX / GLYCOLAX Take 17 g by mouth daily.   sildenafil 20 MG tablet Commonly known as: REVATIO Take 1 tablet (20 mg total) by mouth 3 (three)  times daily.   torsemide 20 MG tablet Commonly known as: DEMADEX Take 3 tablets (60 mg total) by mouth 2 (two) times daily.   Trulicity 1.5 TI/4.5YK Sopn Generic drug: Dulaglutide Inject 1.5 mg into the skin once a week.   warfarin 7.5 MG tablet Commonly known as: COUMADIN Take as directed. If you are unsure how to take this medication, talk to your nurse or doctor. Original instructions: Take 7.5 mg by mouth daily.        Contact information for after-discharge care     [facility] .   Service: Skilled Nursing Contact information: [place] [phone]                    Allergies  Allergen Reactions   Codeine     Headache      Consultations: GI   Procedures/Studies: CT Abdomen Pelvis Wo Contrast  Result Date: 10/14/2020 CLINICAL DATA:  Evaluate for infection EXAM: CT CHEST, ABDOMEN AND PELVIS WITHOUT CONTRAST TECHNIQUE: Multidetector CT imaging of the chest, abdomen and pelvis was performed following the standard protocol without IV contrast. COMPARISON:  CT abdomen and pelvis dated December 26, 2018 FINDINGS: CT CHEST FINDINGS Cardiovascular: Cardiomegaly. No pericardial effusion. Three-vessel coronary artery calcifications. Mitral annular calcifications. Atherosclerotic disease of the thoracic aorta. Mediastinum/Nodes: Mildly enlarged mediastinal lymph nodes which are likely reactive. Reference right pulmonary ligament lymph node measuring 1.4 cm in short axis on series 2, image 31. Esophagus and thyroid are unremarkable. Lungs/Pleura: Central airways are patent. Small bilateral pleural effusions and atelectasis. Ground-glass opacities of the bilateral upper lungs. Solid pulmonary nodule of the right lower lobe measuring 3 mm in mean diameter on series 3, image 78. Musculoskeletal: No chest wall mass or suspicious bone lesions identified. CT ABDOMEN PELVIS FINDINGS Hepatobiliary: Nodular liver contour. Gallbladder is unremarkable. No biliary ductal dilation. Pancreas: Mild peripancreatic fat stranding, likely due to volume status given normal serum lipase. Otherwise unremarkable. Spleen: Unremarkable. Adrenals/Urinary Tract: Bilateral adrenal glands are unremarkable. Cortical thinning of the bilateral kidneys. No evidence of hydronephrosis or nephrolithiasis. Distended urinary bladder. Stomach/Bowel: Stomach is within normal limits. Prior right hemi colectomy. No evidence of bowel wall thickening, distention, or inflammatory changes. Vascular/Lymphatic: Aortic atherosclerosis. No enlarged abdominal or pelvic lymph nodes. Reproductive: Prostate is unremarkable. Other: Small volume abdominal ascites. Diffuse soft tissue  anasarca. Musculoskeletal: No acute or significant osseous findings. IMPRESSION: Mild bilateral ground-glass opacities which are most prominent in the upper lungs, favor pulmonary edema, although an infectious process could appear similar. Small bilateral pleural effusions, small volume abdominal ascites, and soft tissue anasarca, likely due to volume status. Nodular liver contour, suggestive of cirrhosis. Solid pulmonary nodule the right lower lobe measuring 3 mm. No follow-up needed if patient is low-risk. Non-contrast chest CT can be considered in 12 months if patient is high-risk. This recommendation follows the consensus statement: Guidelines for Management of Incidental Pulmonary Nodules Detected on CT Images: From the Fleischner Society 2017; Radiology 2017; 284:228-243. Aortic Atherosclerosis (ICD10-I70.0). Electronically Signed   By: Yetta Glassman M.D.   On: 10/14/2020 13:11   CT Chest Wo Contrast  Result Date: 10/14/2020 CLINICAL DATA:  Evaluate for infection EXAM: CT CHEST, ABDOMEN AND PELVIS WITHOUT CONTRAST TECHNIQUE: Multidetector CT imaging of the chest, abdomen and pelvis was performed following the standard protocol without IV contrast. COMPARISON:  CT abdomen and pelvis dated December 26, 2018 FINDINGS: CT CHEST FINDINGS Cardiovascular: Cardiomegaly. No pericardial effusion.  Three-vessel coronary artery calcifications. Mitral annular calcifications. Atherosclerotic disease of the thoracic aorta. Mediastinum/Nodes: Mildly enlarged mediastinal lymph nodes which are likely reactive. Reference right pulmonary ligament lymph node measuring 1.4 cm in short axis on series 2, image 31. Esophagus and thyroid are unremarkable. Lungs/Pleura: Central airways are patent. Small bilateral pleural effusions and atelectasis. Ground-glass opacities of the bilateral upper lungs. Solid pulmonary nodule of the right lower lobe measuring 3 mm in mean diameter on series 3, image 78. Musculoskeletal: No chest wall  mass or suspicious bone lesions identified. CT ABDOMEN PELVIS FINDINGS Hepatobiliary: Nodular liver contour. Gallbladder is unremarkable. No biliary ductal dilation. Pancreas: Mild peripancreatic fat stranding, likely due to volume status given normal serum lipase. Otherwise unremarkable. Spleen: Unremarkable. Adrenals/Urinary Tract: Bilateral adrenal glands are unremarkable. Cortical thinning of the bilateral kidneys. No evidence of hydronephrosis or nephrolithiasis. Distended urinary bladder. Stomach/Bowel: Stomach is within normal limits. Prior right hemi colectomy. No evidence of bowel wall thickening, distention, or inflammatory changes. Vascular/Lymphatic: Aortic atherosclerosis. No enlarged abdominal or pelvic lymph nodes. Reproductive: Prostate is unremarkable. Other: Small volume abdominal ascites. Diffuse soft tissue anasarca. Musculoskeletal: No acute or significant osseous findings. IMPRESSION: Mild bilateral ground-glass opacities which are most prominent in the upper lungs, favor pulmonary edema, although an infectious process could appear similar. Small bilateral pleural effusions, small volume abdominal ascites, and soft tissue anasarca, likely due to volume status. Nodular liver contour, suggestive of cirrhosis. Solid pulmonary nodule the right lower lobe measuring 3 mm. No follow-up needed if patient is low-risk. Non-contrast chest CT can be considered in 12 months if patient is high-risk. This recommendation follows the consensus statement: Guidelines for Management of Incidental Pulmonary Nodules Detected on CT Images: From the Fleischner Society 2017; Radiology 2017; 284:228-243. Aortic Atherosclerosis (ICD10-I70.0). Electronically Signed   By: Yetta Glassman M.D.   On: 10/14/2020 13:11   DG Chest Port 1 View  Result Date: 10/15/2020 CLINICAL DATA:  Pulmonary edema. EXAM: PORTABLE CHEST 1 VIEW COMPARISON:  October 14, 2020. FINDINGS: Stable cardiomegaly. Mild central pulmonary vascular  congestion is noted. Bibasilar pulmonary edema or infiltrate is noted. Small bilateral pleural effusions are noted. Bony thorax is unremarkable. IMPRESSION: Stable cardiomegaly with mild central pulmonary vascular congestion. Bibasilar pulmonary edema or infiltrates are noted. Small bilateral pleural effusions are noted. Electronically Signed   By: Marijo Conception M.D.   On: 10/15/2020 08:10   DG Chest Port 1 View  Result Date: 10/14/2020 CLINICAL DATA:  Cough and chest pain. EXAM: PORTABLE CHEST 1 VIEW COMPARISON:  06/24/2020 FINDINGS: Stable cardiac enlargement. Aortic atherosclerosis. Central pulmonary vascular congestion is noted as before. New asymmetric airspace opacity is identified within the right upper lobe. Chronic pleural thickening along the lateral left base is again seen. IMPRESSION: 1. New right upper lobe airspace opacity compatible with pneumonia. 2. Cardiac enlargement and pulmonary vascular congestion. Electronically Signed   By: Kerby Moors M.D.   On: 10/14/2020 11:34   ECHOCARDIOGRAM COMPLETE  Result Date: 10/16/2020    ECHOCARDIOGRAM REPORT   Patient Name:   GRAEME MENEES Date of Exam: 10/16/2020 Medical Rec #:  [id number]      Height:       64.0 in Accession #:    [id number]     Weight:       274.9 lb Date of Birth:  Jun 02, 1950      BSA:          2.240 m Patient Age:    43 years  BP:           115/74 mmHg Patient Gender: M              HR:           79 bpm. Exam Location:  [facility] Procedure: 2D Echo, Cardiac Doppler and Color Doppler Indications:    CHF  History:        Patient has prior history of Echocardiogram examinations, most                 recent 06/21/2020. CHF, Arrythmias:Atrial Fibrillation,                 Signs/Symptoms:Syncope; Risk Factors:Hypertension, Diabetes and                 Dyslipidemia. Right Heart Failure, Nonischemic cardiomyopathy,                 Morbid obesity.  Sonographer:    Wenda Low Referring Phys: [place]  1. Left ventricular ejection fraction, by estimation, is 55 to 60%. The left ventricle has normal function. The left ventricle has no regional wall motion abnormalities. There is moderate concentric left ventricular hypertrophy. Left ventricular diastolic function could not be evaluated. There is the interventricular septum is flattened in systole and diastole, consistent with right ventricular pressure and volume overload.  2. Right ventricular systolic function is moderately reduced. The right ventricular size is severely enlarged.  3. Left atrial size was moderately dilated.  4. Right atrial size was severely dilated.  5. The mitral valve is degenerative. Mild mitral valve regurgitation. No evidence of mitral stenosis.  6. The aortic valve is tricuspid. There is moderate calcification of the aortic valve. There is moderate thickening of the aortic valve. Aortic valve regurgitation is not visualized. Mild to moderate aortic valve sclerosis/calcification is present, without any evidence of aortic stenosis. Comparison(s): No significant change from prior study. Conclusion(s)/Recommendation(s): Findings consistent with Cor Pulmonale. FINDINGS  Left Ventricle: Left ventricular ejection fraction, by estimation, is 55 to 60%. The left ventricle has normal function. The left ventricle has no regional wall motion abnormalities. Definity contrast agent was given IV to delineate the left ventricular  endocardial borders. The left ventricular internal cavity size was normal in size. There is moderate concentric left ventricular hypertrophy. The interventricular septum is flattened in systole and diastole, consistent with right ventricular pressure and volume overload. Left ventricular diastolic function could not be evaluated due to atrial fibrillation. Left ventricular diastolic function could not be evaluated. Right Ventricle: The right ventricular size is severely enlarged. No increase in right ventricular  wall thickness. Right ventricular systolic function is moderately reduced. Left Atrium: Left atrial size was moderately dilated. Right Atrium: Right atrial size was severely dilated. Pericardium: Trivial pericardial effusion is present. Mitral Valve: The mitral valve is degenerative in appearance. Mild to moderate mitral annular calcification. Mild mitral valve regurgitation. No evidence of mitral valve stenosis. MV peak gradient, 10.0 mmHg. The mean mitral valve gradient is 2.0 mmHg. Tricuspid Valve: The tricuspid valve is grossly normal. Tricuspid valve regurgitation is mild . No evidence of tricuspid stenosis. Aortic Valve: The aortic valve is tricuspid. There is moderate calcification of the aortic valve. There is moderate thickening of the aortic valve. Aortic valve regurgitation is not visualized. Mild to moderate aortic valve sclerosis/calcification is present, without any evidence of aortic stenosis. Aortic valve mean gradient measures 4.0 mmHg. Aortic valve peak gradient measures 9.0 mmHg. Aortic valve area, by  VTI measures 2.01 cm. Pulmonic Valve: The pulmonic valve was grossly normal. Pulmonic valve regurgitation is not visualized. No evidence of pulmonic stenosis. Aorta: The aortic root and ascending aorta are structurally normal, with no evidence of dilitation. Venous: The inferior vena cava was not well visualized. IAS/Shunts: The atrial septum is grossly normal.  LEFT VENTRICLE PLAX 2D LVIDd:         5.50 cm   Diastology LVIDs:         3.50 cm   LV e' medial:    6.31 cm/s LV PW:         1.30 cm   LV E/e' medial:  21.6 LV IVS:        1.50 cm   LV e' lateral:   9.90 cm/s LVOT diam:     2.00 cm   LV E/e' lateral: 13.7 LV SV:         58 LV SV Index:   26 LVOT Area:     3.14 cm  RIGHT VENTRICLE RV Basal diam:  4.70 cm RV Mid diam:    3.90 cm RV S prime:     10.10 cm/s TAPSE (M-mode): 1.3 cm LEFT ATRIUM              Index        RIGHT ATRIUM           Index LA diam:        5.70 cm  2.55 cm/m   RA Area:      30.50 cm LA Vol (A2C):   137.0 ml 61.17 ml/m  RA Volume:   121.00 ml 54.03 ml/m LA Vol (A4C):   95.4 ml  42.60 ml/m LA Biplane Vol: 120.0 ml 53.58 ml/m  AORTIC VALVE                    PULMONIC VALVE AV Area (Vmax):    1.87 cm     PV Vmax:       0.57 m/s AV Area (Vmean):   2.03 cm     PV Peak grad:  1.3 mmHg AV Area (VTI):     2.01 cm AV Vmax:           150.00 cm/s AV Vmean:          96.900 cm/s AV VTI:            0.289 m AV Peak Grad:      9.0 mmHg AV Mean Grad:      4.0 mmHg LVOT Vmax:         89.20 cm/s LVOT Vmean:        62.600 cm/s LVOT VTI:          0.185 m LVOT/AV VTI ratio: 0.64  AORTA Ao Root diam: 3.20 cm Ao Asc diam:  3.20 cm MITRAL VALVE                TRICUSPID VALVE MV Area (PHT): 3.61 cm     TR Peak grad:   74.3 mmHg MV Area VTI:   1.74 cm     TR Vmax:        431.00 cm/s MV Peak grad:  10.0 mmHg MV Mean grad:  2.0 mmHg     SHUNTS MV Vmax:       1.58 m/s     Systemic VTI:  0.18 m MV Vmean:      46.3 cm/s    Systemic Diam: 2.00 cm MV Decel Time: 210 msec MV  E velocity: 136.00 cm/s MV A velocity: 42.70 cm/s MV E/A ratio:  3.19 Eleonore Chiquito MD Electronically signed by Eleonore Chiquito MD Signature Date/Time: 10/16/2020/4:13:15 PM    Final    [place] ASCITES (ABDOMEN LIMITED)  Result Date: 10/14/2020 CLINICAL DATA:  Abdominal distension question ascites. History CHF, cardiomyopathy, coronary artery disease, colon cancer, diabetes mellitus, hypertension EXAM: LIMITED ABDOMEN ULTRASOUND FOR ASCITES TECHNIQUE: Limited ultrasound survey for ascites was performed in all four abdominal quadrants. COMPARISON:  CT abdomen and pelvis 10/14/2020 FINDINGS: Small collection of ascites in LEFT lower quadrant, containing bowel loops. No other significant ascites seen. Volume of ascites is insufficient for paracentesis. IMPRESSION: Low volume ascites insufficient for paracentesis. Electronically Signed   By: Lavonia Dana M.D.   On: 10/14/2020 15:49   US LIVER DOPPLER  Result Date: 10/15/2020 CLINICAL DATA:   Cirrhosis. EXAM: DUPLEX ULTRASOUND OF LIVER TECHNIQUE: Color and duplex Doppler ultrasound was performed to evaluate the hepatic in-flow and out-flow vessels. COMPARISON:  10/14/2020 and CT 10/14/2020 FINDINGS: Liver: Nodular contour compatible with cirrhosis. Perihepatic ascites. Main Portal Vein size: 1.3 cm Portal Vein Velocities Main Prox:  54 cm/sec Main Mid: 47 cm/sec Main Dist:  24 cm/sec Right: 42 cm/sec Left: 27 cm/sec Hepatic Vein Velocities Right:  64 cm/sec Middle:  59 cm/sec Left:  87 cm/sec IVC: Present and patent with normal respiratory phasicity. Hepatic Artery Velocity:  95 cm/sec Splenic Vein Velocity:  23 cm/sec Spleen: 17.1 cm x 18.7 cm x 7.3 cm with a total volume of 1229 cm^3 (411 cm^3 is upper limit normal) Portal Vein Occlusion/Thrombus: No Splenic Vein Occlusion/Thrombus: No Ascites: Present Varices: Limited evaluation Normal hepatopetal flow in the portal veins. Normal hepatofugal flow in the hepatic veins. IMPRESSION: 1. Cirrhosis with splenomegaly and perihepatic ascites. Findings are suggestive for portal hypertension. 2. Portal venous system is patent with normal direction of flow. Electronically Signed   By: Markus Daft M.D.   On: 10/15/2020 11:43        Discharge Exam: Vitals:   10/24/20 0716 10/24/20 0831  BP:  (!) 110/96  Pulse:  89  Resp:    Temp: 98.7 F (37.1 C)   SpO2:     Vitals:   10/24/20 0500 10/24/20 0549 10/24/20 0716 10/24/20 0831  BP:  106/65  (!) 110/96  Pulse:  92  89  Resp:  18    Temp:   98.7 F (37.1 C)   TempSrc:   Oral   SpO2:  (!) 89%    Weight: 118.8 kg     Height:        General: Pt is alert, awake, not in acute distress Cardiovascular: RRR, S1/S2 +, no rubs, no gallops Respiratory: fine bibasilar crackles. No wheeze Abdominal: Soft, NT, ND, bowel sounds + Extremities: 1 + LE edema, no cyanosis   The results of significant diagnostics from this hospitalization (including imaging, microbiology, ancillary and laboratory) are  listed below for reference.    Significant Diagnostic Studies: CT Abdomen Pelvis Wo Contrast  Result Date: 10/14/2020 CLINICAL DATA:  Evaluate for infection EXAM: CT CHEST, ABDOMEN AND PELVIS WITHOUT CONTRAST TECHNIQUE: Multidetector CT imaging of the chest, abdomen and pelvis was performed following the standard protocol without IV contrast. COMPARISON:  CT abdomen and pelvis dated December 26, 2018 FINDINGS: CT CHEST FINDINGS Cardiovascular: Cardiomegaly. No pericardial effusion. Three-vessel coronary artery calcifications. Mitral annular calcifications. Atherosclerotic disease of the thoracic aorta. Mediastinum/Nodes: Mildly enlarged mediastinal lymph nodes which are likely reactive. Reference right pulmonary ligament lymph node measuring 1.4  cm in short axis on series 2, image 31. Esophagus and thyroid are unremarkable. Lungs/Pleura: Central airways are patent. Small bilateral pleural effusions and atelectasis. Ground-glass opacities of the bilateral upper lungs. Solid pulmonary nodule of the right lower lobe measuring 3 mm in mean diameter on series 3, image 78. Musculoskeletal: No chest wall mass or suspicious bone lesions identified. CT ABDOMEN PELVIS FINDINGS Hepatobiliary: Nodular liver contour. Gallbladder is unremarkable. No biliary ductal dilation. Pancreas: Mild peripancreatic fat stranding, likely due to volume status given normal serum lipase. Otherwise unremarkable. Spleen: Unremarkable. Adrenals/Urinary Tract: Bilateral adrenal glands are unremarkable. Cortical thinning of the bilateral kidneys. No evidence of hydronephrosis or nephrolithiasis. Distended urinary bladder. Stomach/Bowel: Stomach is within normal limits. Prior right hemi colectomy. No evidence of bowel wall thickening, distention, or inflammatory changes. Vascular/Lymphatic: Aortic atherosclerosis. No enlarged abdominal or pelvic lymph nodes. Reproductive: Prostate is unremarkable. Other: Small volume abdominal ascites. Diffuse  soft tissue anasarca. Musculoskeletal: No acute or significant osseous findings. IMPRESSION: Mild bilateral ground-glass opacities which are most prominent in the upper lungs, favor pulmonary edema, although an infectious process could appear similar. Small bilateral pleural effusions, small volume abdominal ascites, and soft tissue anasarca, likely due to volume status. Nodular liver contour, suggestive of cirrhosis. Solid pulmonary nodule the right lower lobe measuring 3 mm. No follow-up needed if patient is low-risk. Non-contrast chest CT can be considered in 12 months if patient is high-risk. This recommendation follows the consensus statement: Guidelines for Management of Incidental Pulmonary Nodules Detected on CT Images: From the Fleischner Society 2017; Radiology 2017; 284:228-243. Aortic Atherosclerosis (ICD10-I70.0). Electronically Signed   By: Yetta Glassman M.D.   On: 10/14/2020 13:11   CT Chest Wo Contrast  Result Date: 10/14/2020 CLINICAL DATA:  Evaluate for infection EXAM: CT CHEST, ABDOMEN AND PELVIS WITHOUT CONTRAST TECHNIQUE: Multidetector CT imaging of the chest, abdomen and pelvis was performed following the standard protocol without IV contrast. COMPARISON:  CT abdomen and pelvis dated December 26, 2018 FINDINGS: CT CHEST FINDINGS Cardiovascular: Cardiomegaly. No pericardial effusion. Three-vessel coronary artery calcifications. Mitral annular calcifications. Atherosclerotic disease of the thoracic aorta. Mediastinum/Nodes: Mildly enlarged mediastinal lymph nodes which are likely reactive. Reference right pulmonary ligament lymph node measuring 1.4 cm in short axis on series 2, image 31. Esophagus and thyroid are unremarkable. Lungs/Pleura: Central airways are patent. Small bilateral pleural effusions and atelectasis. Ground-glass opacities of the bilateral upper lungs. Solid pulmonary nodule of the right lower lobe measuring 3 mm in mean diameter on series 3, image 78. Musculoskeletal:  No chest wall mass or suspicious bone lesions identified. CT ABDOMEN PELVIS FINDINGS Hepatobiliary: Nodular liver contour. Gallbladder is unremarkable. No biliary ductal dilation. Pancreas: Mild peripancreatic fat stranding, likely due to volume status given normal serum lipase. Otherwise unremarkable. Spleen: Unremarkable. Adrenals/Urinary Tract: Bilateral adrenal glands are unremarkable. Cortical thinning of the bilateral kidneys. No evidence of hydronephrosis or nephrolithiasis. Distended urinary bladder. Stomach/Bowel: Stomach is within normal limits. Prior right hemi colectomy. No evidence of bowel wall thickening, distention, or inflammatory changes. Vascular/Lymphatic: Aortic atherosclerosis. No enlarged abdominal or pelvic lymph nodes. Reproductive: Prostate is unremarkable. Other: Small volume abdominal ascites. Diffuse soft tissue anasarca. Musculoskeletal: No acute or significant osseous findings. IMPRESSION: Mild bilateral ground-glass opacities which are most prominent in the upper lungs, favor pulmonary edema, although an infectious process could appear similar. Small bilateral pleural effusions, small volume abdominal ascites, and soft tissue anasarca, likely due to volume status. Nodular liver contour, suggestive of cirrhosis. Solid pulmonary nodule the right lower lobe measuring 3  mm. No follow-up needed if patient is low-risk. Non-contrast chest CT can be considered in 12 months if patient is high-risk. This recommendation follows the consensus statement: Guidelines for Management of Incidental Pulmonary Nodules Detected on CT Images: From the Fleischner Society 2017; Radiology 2017; 284:228-243. Aortic Atherosclerosis (ICD10-I70.0). Electronically Signed   By: Yetta Glassman M.D.   On: 10/14/2020 13:11   DG Chest Port 1 View  Result Date: 10/15/2020 CLINICAL DATA:  Pulmonary edema. EXAM: PORTABLE CHEST 1 VIEW COMPARISON:  October 14, 2020. FINDINGS: Stable cardiomegaly. Mild central  pulmonary vascular congestion is noted. Bibasilar pulmonary edema or infiltrate is noted. Small bilateral pleural effusions are noted. Bony thorax is unremarkable. IMPRESSION: Stable cardiomegaly with mild central pulmonary vascular congestion. Bibasilar pulmonary edema or infiltrates are noted. Small bilateral pleural effusions are noted. Electronically Signed   By: Marijo Conception M.D.   On: 10/15/2020 08:10   DG Chest Port 1 View  Result Date: 10/14/2020 CLINICAL DATA:  Cough and chest pain. EXAM: PORTABLE CHEST 1 VIEW COMPARISON:  06/24/2020 FINDINGS: Stable cardiac enlargement. Aortic atherosclerosis. Central pulmonary vascular congestion is noted as before. New asymmetric airspace opacity is identified within the right upper lobe. Chronic pleural thickening along the lateral left base is again seen. IMPRESSION: 1. New right upper lobe airspace opacity compatible with pneumonia. 2. Cardiac enlargement and pulmonary vascular congestion. Electronically Signed   By: Kerby Moors M.D.   On: 10/14/2020 11:34   ECHOCARDIOGRAM COMPLETE  Result Date: 10/16/2020    ECHOCARDIOGRAM REPORT   Patient Name:   ROCKNE DEARINGER Date of Exam: 10/16/2020 Medical Rec #:  [id number]      Height:       64.0 in Accession #:    [id number]     Weight:       274.9 lb Date of Birth:  June 04, 1950      BSA:          2.240 m Patient Age:    70 years       BP:           115/74 mmHg Patient Gender: M              HR:           79 bpm. Exam Location:  [facility] Procedure: 2D Echo, Cardiac Doppler and Color Doppler Indications:    CHF  History:        Patient has prior history of Echocardiogram examinations, most                 recent 06/21/2020. CHF, Arrythmias:Atrial Fibrillation,                 Signs/Symptoms:Syncope; Risk Factors:Hypertension, Diabetes and                 Dyslipidemia. Right Heart Failure, Nonischemic cardiomyopathy,                 Morbid obesity.  Sonographer:    Wenda Low Referring Phys: [place]  1. Left ventricular ejection fraction, by estimation, is 55 to 60%. The left ventricle has normal function. The left ventricle has no regional wall motion abnormalities. There is moderate concentric left ventricular hypertrophy. Left ventricular diastolic function could not be evaluated. There is the interventricular septum is flattened in systole and diastole, consistent with right ventricular pressure and volume overload.  2. Right ventricular systolic function is moderately reduced. The right ventricular size is severely enlarged.  3.  Left atrial size was moderately dilated.  4. Right atrial size was severely dilated.  5. The mitral valve is degenerative. Mild mitral valve regurgitation. No evidence of mitral stenosis.  6. The aortic valve is tricuspid. There is moderate calcification of the aortic valve. There is moderate thickening of the aortic valve. Aortic valve regurgitation is not visualized. Mild to moderate aortic valve sclerosis/calcification is present, without any evidence of aortic stenosis. Comparison(s): No significant change from prior study. Conclusion(s)/Recommendation(s): Findings consistent with Cor Pulmonale. FINDINGS  Left Ventricle: Left ventricular ejection fraction, by estimation, is 55 to 60%. The left ventricle has normal function. The left ventricle has no regional wall motion abnormalities. Definity contrast agent was given IV to delineate the left ventricular  endocardial borders. The left ventricular internal cavity size was normal in size. There is moderate concentric left ventricular hypertrophy. The interventricular septum is flattened in systole and diastole, consistent with right ventricular pressure and volume overload. Left ventricular diastolic function could not be evaluated due to atrial fibrillation. Left ventricular diastolic function could not be evaluated. Right Ventricle: The right ventricular size is severely enlarged. No increase in right  ventricular wall thickness. Right ventricular systolic function is moderately reduced. Left Atrium: Left atrial size was moderately dilated. Right Atrium: Right atrial size was severely dilated. Pericardium: Trivial pericardial effusion is present. Mitral Valve: The mitral valve is degenerative in appearance. Mild to moderate mitral annular calcification. Mild mitral valve regurgitation. No evidence of mitral valve stenosis. MV peak gradient, 10.0 mmHg. The mean mitral valve gradient is 2.0 mmHg. Tricuspid Valve: The tricuspid valve is grossly normal. Tricuspid valve regurgitation is mild . No evidence of tricuspid stenosis. Aortic Valve: The aortic valve is tricuspid. There is moderate calcification of the aortic valve. There is moderate thickening of the aortic valve. Aortic valve regurgitation is not visualized. Mild to moderate aortic valve sclerosis/calcification is present, without any evidence of aortic stenosis. Aortic valve mean gradient measures 4.0 mmHg. Aortic valve peak gradient measures 9.0 mmHg. Aortic valve area, by VTI measures 2.01 cm. Pulmonic Valve: The pulmonic valve was grossly normal. Pulmonic valve regurgitation is not visualized. No evidence of pulmonic stenosis. Aorta: The aortic root and ascending aorta are structurally normal, with no evidence of dilitation. Venous: The inferior vena cava was not well visualized. IAS/Shunts: The atrial septum is grossly normal.  LEFT VENTRICLE PLAX 2D LVIDd:         5.50 cm   Diastology LVIDs:         3.50 cm   LV e' medial:    6.31 cm/s LV PW:         1.30 cm   LV E/e' medial:  21.6 LV IVS:        1.50 cm   LV e' lateral:   9.90 cm/s LVOT diam:     2.00 cm   LV E/e' lateral: 13.7 LV SV:         58 LV SV Index:   26 LVOT Area:     3.14 cm  RIGHT VENTRICLE RV Basal diam:  4.70 cm RV Mid diam:    3.90 cm RV S prime:     10.10 cm/s TAPSE (M-mode): 1.3 cm LEFT ATRIUM              Index        RIGHT ATRIUM           Index LA diam:        5.70 cm  2.55 cm/m  RA Area:     30.50 cm LA Vol (A2C):   137.0 ml 61.17 ml/m  RA Volume:   121.00 ml 54.03 ml/m LA Vol (A4C):   95.4 ml  42.60 ml/m LA Biplane Vol: 120.0 ml 53.58 ml/m  AORTIC VALVE                    PULMONIC VALVE AV Area (Vmax):    1.87 cm     PV Vmax:       0.57 m/s AV Area (Vmean):   2.03 cm     PV Peak grad:  1.3 mmHg AV Area (VTI):     2.01 cm AV Vmax:           150.00 cm/s AV Vmean:          96.900 cm/s AV VTI:            0.289 m AV Peak Grad:      9.0 mmHg AV Mean Grad:      4.0 mmHg LVOT Vmax:         89.20 cm/s LVOT Vmean:        62.600 cm/s LVOT VTI:          0.185 m LVOT/AV VTI ratio: 0.64  AORTA Ao Root diam: 3.20 cm Ao Asc diam:  3.20 cm MITRAL VALVE                TRICUSPID VALVE MV Area (PHT): 3.61 cm     TR Peak grad:   74.3 mmHg MV Area VTI:   1.74 cm     TR Vmax:        431.00 cm/s MV Peak grad:  10.0 mmHg MV Mean grad:  2.0 mmHg     SHUNTS MV Vmax:       1.58 m/s     Systemic VTI:  0.18 m MV Vmean:      46.3 cm/s    Systemic Diam: 2.00 cm MV Decel Time: 210 msec MV E velocity: 136.00 cm/s MV A velocity: 42.70 cm/s MV E/A ratio:  3.19 Eleonore Chiquito MD Electronically signed by Eleonore Chiquito MD Signature Date/Time: 10/16/2020/4:13:15 PM    Final    [place] ASCITES (ABDOMEN LIMITED)  Result Date: 10/14/2020 CLINICAL DATA:  Abdominal distension question ascites. History CHF, cardiomyopathy, coronary artery disease, colon cancer, diabetes mellitus, hypertension EXAM: LIMITED ABDOMEN ULTRASOUND FOR ASCITES TECHNIQUE: Limited ultrasound survey for ascites was performed in all four abdominal quadrants. COMPARISON:  CT abdomen and pelvis 10/14/2020 FINDINGS: Small collection of ascites in LEFT lower quadrant, containing bowel loops. No other significant ascites seen. Volume of ascites is insufficient for paracentesis. IMPRESSION: Low volume ascites insufficient for paracentesis. Electronically Signed   By: Lavonia Dana M.D.   On: 10/14/2020 15:49   US LIVER DOPPLER  Result Date:  10/15/2020 CLINICAL DATA:  Cirrhosis. EXAM: DUPLEX ULTRASOUND OF LIVER TECHNIQUE: Color and duplex Doppler ultrasound was performed to evaluate the hepatic in-flow and out-flow vessels. COMPARISON:  10/14/2020 and CT 10/14/2020 FINDINGS: Liver: Nodular contour compatible with cirrhosis. Perihepatic ascites. Main Portal Vein size: 1.3 cm Portal Vein Velocities Main Prox:  54 cm/sec Main Mid: 47 cm/sec Main Dist:  24 cm/sec Right: 42 cm/sec Left: 27 cm/sec Hepatic Vein Velocities Right:  64 cm/sec Middle:  59 cm/sec Left:  87 cm/sec IVC: Present and patent with normal respiratory phasicity. Hepatic Artery Velocity:  95 cm/sec Splenic Vein Velocity:  23 cm/sec Spleen: 17.1 cm x 18.7 cm x 7.3 cm with  a total volume of 1229 cm^3 (411 cm^3 is upper limit normal) Portal Vein Occlusion/Thrombus: No Splenic Vein Occlusion/Thrombus: No Ascites: Present Varices: Limited evaluation Normal hepatopetal flow in the portal veins. Normal hepatofugal flow in the hepatic veins. IMPRESSION: 1. Cirrhosis with splenomegaly and perihepatic ascites. Findings are suggestive for portal hypertension. 2. Portal venous system is patent with normal direction of flow. Electronically Signed   By: Markus Daft M.D.   On: 10/15/2020 11:43    Microbiology: Recent Results (from the past 240 hour(s))  Resp Panel by RT-PCR (Flu A&B, Covid) Nasopharyngeal Swab     Status: None   Collection Time: 10/14/20  2:15 PM   Specimen: Nasopharyngeal Swab; Nasopharyngeal(NP) swabs in vial transport medium  Result Value Ref Range Status   SARS Coronavirus 2 by RT PCR NEGATIVE NEGATIVE Final    Comment: (NOTE) SARS-CoV-2 target nucleic acids are NOT DETECTED.  The SARS-CoV-2 RNA is generally detectable in upper respiratory specimens during the acute phase of infection. The lowest concentration of SARS-CoV-2 viral copies this assay can detect is 138 copies/mL. A negative result does not preclude SARS-Cov-2 infection and should not be used as the sole  basis for treatment or other patient management decisions. A negative result may occur with  improper specimen collection/handling, submission of specimen other than nasopharyngeal swab, presence of viral mutation(s) within the areas targeted by this assay, and inadequate number of viral copies(<138 copies/mL). A negative result must be combined with clinical observations, patient history, and epidemiological information. The expected result is Negative.  Fact Sheet for Patients:  EntrepreneurPulse.com.au  Fact Sheet for Healthcare Providers:  IncredibleEmployment.be  This test is no t yet approved or cleared by the [place] FDA and  has been authorized for detection and/or diagnosis of SARS-CoV-2 by FDA under an Emergency Use Authorization (EUA). This EUA will remain  in effect (meaning this test can be used) for the duration of the COVID-19 declaration under Section 564(b)(1) of the Act, 21 U.S.C.section 360bbb-3(b)(1), unless the authorization is terminated  or revoked sooner.       Influenza A by PCR NEGATIVE NEGATIVE Final   Influenza B by PCR NEGATIVE NEGATIVE Final    Comment: (NOTE) The Xpert Xpress SARS-CoV-2/FLU/RSV plus assay is intended as an aid in the diagnosis of influenza from Nasopharyngeal swab specimens and should not be used as a sole basis for treatment. Nasal washings and aspirates are unacceptable for Xpert Xpress SARS-CoV-2/FLU/RSV testing.  Fact Sheet for Patients: EntrepreneurPulse.com.au  Fact Sheet for Healthcare Providers: IncredibleEmployment.be  This test is not yet approved or cleared by the [place] FDA and has been authorized for detection and/or diagnosis of SARS-CoV-2 by FDA under an Emergency Use Authorization (EUA). This EUA will remain in effect (meaning this test can be used) for the duration of the COVID-19 declaration under Section 564(b)(1) of the Act,  21 U.S.C. section 360bbb-3(b)(1), unless the authorization is terminated or revoked.  Performed at [facility], [address] [place], [place] [address]      Labs: Basic Metabolic Panel: Recent Labs  Lab 10/20/20 [phone] 10/21/20 0628 10/22/20 0442 10/23/20 0521 10/24/20 0433  NA 137 136 136 137 137  K 5.0 4.4 5.0 4.9 4.9  CL 105 105 105 104 101  CO2 27 25 25 26 28   GLUCOSE 142* 103* 114* 116* 149*  BUN 47* 44* 41* 44* 52*  CREATININE 2.35* 1.86* 1.95* 2.04* 2.29*  CALCIUM 8.6* 8.4* 8.8* 8.6* 8.5*  MG  --  2.4  --   --   --  Liver Function Tests: Recent Labs  Lab 10/18/20 0532 10/19/20 0038 10/21/20 2637 10/22/20 0442 10/23/20 0521 10/24/20 0433  AST 17  --  15 16 13* 18  ALT 16  --  12 12 11 11   ALKPHOS 60  --  55 64 65 67  BILITOT 0.8  --  1.0 0.9 1.1 0.7  PROT 6.0*  --  6.0* 6.5 5.9* 6.0*  ALBUMIN 3.1* 3.3* 3.1* 3.3* 3.1* 3.0*   No results for input(s): LIPASE, AMYLASE in the last 168 hours. No results for input(s): AMMONIA in the last 168 hours. CBC: Recent Labs  Lab 10/20/20 0655 10/20/20 1614 10/21/20 0628 10/22/20 0442 10/23/20 0521 10/24/20 0433  WBC 4.7  --  4.3 4.6 4.0 3.6*  HGB 8.2* 7.7* 8.5* 8.6* 8.3* 8.2*  HCT 29.3* 27.6* 29.4* 30.4* 28.8* 29.1*  MCV 93.9  --  93.0 94.1 92.6 93.3  PLT 108*  --  93* 103* 88* 78*   Cardiac Enzymes: No results for input(s): CKTOTAL, CKMB, CKMBINDEX, TROPONINI in the last 168 hours. BNP: Invalid input(s): POCBNP CBG: Recent Labs  Lab 10/23/20 1117 10/23/20 1614 10/23/20 2217 10/24/20 0720 10/24/20 1113  GLUCAP 118* 139* 143* 117* 134*    Time coordinating discharge:  36 minutes  Signed:  Orson Eva, DO [facility] Pager: [phone] 10/24/2020, 11:45 AM

## 2020-10-24 NOTE — TOC Transition Note (Cosign Needed Addendum)
Transition of Care [facility]) - CM/SW Discharge Note   Patient Details  Name: William Flores MRN: [id number] Date of Birth: Dec 23, 1950  Transition of Care [facility]) CM/SW Contact:  Boneta Lucks, RN Phone Number: 10/24/2020, 12:44 PM   Clinical Narrative:   Pateint medically ready to return to [facility]. Patient will update family. Has not spoke to son in 2 years. Debbie provided room # A5-Bed 1. RN to call report.  Patient can transport by wheelchair, [facility] will be called when RN is ready.   Addendum: [facility] will not add anyone else to list, due to long list needing transport.  TOC will add to EMS list, patient will be 6th in line. If patient is not picked up, we can try [facility] again tomorrow.   Final next level of care: Skilled Nursing Facility Barriers to Discharge: Barriers Resolved  Patient Goals and CMS Choice Patient states their goals for this hospitalization and ongoing recovery are:: go to rehab CMS Medicare.gov Compare Post Acute Care list provided to:: Patient Choice offered to / list presented to : Patient  Discharge Placement              Patient chooses bed at:  [facility]) Patient to be transferred to facility by: [facility]   Patient and family notified of of transfer: 10/24/20  Discharge Plan and Services In-house Referral: Clinical Social Work   Post Acute Care Choice: [place]          DME Arranged: N/A       HH Arranged: Therapist, sports, PT [place] Agency: [place] Date [place]: 10/15/20 Time [place]: [place] Representative spoke with at [place]: [place]   Readmission Risk Interventions Readmission Risk Prevention Plan 10/24/2020 10/15/2020 04/15/2019  Transportation Screening Complete Complete Complete  PCP or Specialist Appt within 3-5 Days - - Complete  HRI or [place] - Complete Complete  Social Work Consult for [place] Planning/Counseling - Complete Complete   Palliative Care Screening - Not Applicable Not Applicable  Medication Review Press photographer) Complete Complete Complete  PCP or Specialist appointment within 3-5 days of discharge (No Data) - -  [place] or Home Care Consult Complete - -  SW Recovery Care/Counseling Consult Complete - -  Palliative Care Screening Not Applicable - -  Skilled Nursing Facility Complete - -  Some recent data might be hidden

## 2020-10-26 DIAGNOSIS — E1169 Type 2 diabetes mellitus with other specified complication: Secondary | ICD-10-CM | POA: Diagnosis not present

## 2020-10-26 DIAGNOSIS — M109 Gout, unspecified: Secondary | ICD-10-CM | POA: Diagnosis not present

## 2020-10-26 DIAGNOSIS — I10 Essential (primary) hypertension: Secondary | ICD-10-CM | POA: Diagnosis not present

## 2020-10-26 DIAGNOSIS — E785 Hyperlipidemia, unspecified: Secondary | ICD-10-CM | POA: Diagnosis not present

## 2020-10-26 DIAGNOSIS — Z79899 Other long term (current) drug therapy: Secondary | ICD-10-CM | POA: Diagnosis not present

## 2020-10-26 DIAGNOSIS — K922 Gastrointestinal hemorrhage, unspecified: Secondary | ICD-10-CM | POA: Diagnosis not present

## 2020-10-26 DIAGNOSIS — I4891 Unspecified atrial fibrillation: Secondary | ICD-10-CM | POA: Diagnosis not present

## 2020-10-27 DIAGNOSIS — R5381 Other malaise: Secondary | ICD-10-CM | POA: Diagnosis not present

## 2020-10-27 DIAGNOSIS — K922 Gastrointestinal hemorrhage, unspecified: Secondary | ICD-10-CM | POA: Diagnosis not present

## 2020-10-27 NOTE — Telephone Encounter (Signed)
Yes, he is , let's set up a follow up appointment in the next few weeks and the repeat CBC Thanks

## 2020-10-27 NOTE — Telephone Encounter (Signed)
2 days prior to his appointment

## 2020-10-27 NOTE — Telephone Encounter (Signed)
When will the cbc need to be done. He is scheduled for Hospital follow up on 11/12/2020. Please advise.

## 2020-10-27 NOTE — Telephone Encounter (Signed)
Is this patient discharged???

## 2020-10-28 NOTE — Telephone Encounter (Signed)
I called and left a vm asked the patient to please return call.

## 2020-10-29 DIAGNOSIS — K922 Gastrointestinal hemorrhage, unspecified: Secondary | ICD-10-CM | POA: Diagnosis not present

## 2020-10-29 DIAGNOSIS — R5381 Other malaise: Secondary | ICD-10-CM | POA: Diagnosis not present

## 2020-10-29 DIAGNOSIS — I10 Essential (primary) hypertension: Secondary | ICD-10-CM | POA: Diagnosis not present

## 2020-10-29 DIAGNOSIS — E1169 Type 2 diabetes mellitus with other specified complication: Secondary | ICD-10-CM | POA: Diagnosis not present

## 2020-10-29 DIAGNOSIS — I4891 Unspecified atrial fibrillation: Secondary | ICD-10-CM | POA: Diagnosis not present

## 2020-10-29 DIAGNOSIS — Z5181 Encounter for therapeutic drug level monitoring: Secondary | ICD-10-CM | POA: Diagnosis not present

## 2020-10-29 DIAGNOSIS — E785 Hyperlipidemia, unspecified: Secondary | ICD-10-CM | POA: Diagnosis not present

## 2020-10-29 DIAGNOSIS — M109 Gout, unspecified: Secondary | ICD-10-CM | POA: Diagnosis not present

## 2020-10-30 NOTE — Telephone Encounter (Signed)
I called and left a message asked that the patient please return call.

## 2020-11-02 DIAGNOSIS — Z5181 Encounter for therapeutic drug level monitoring: Secondary | ICD-10-CM | POA: Diagnosis not present

## 2020-11-02 DIAGNOSIS — I4891 Unspecified atrial fibrillation: Secondary | ICD-10-CM | POA: Diagnosis not present

## 2020-11-02 DIAGNOSIS — E1169 Type 2 diabetes mellitus with other specified complication: Secondary | ICD-10-CM | POA: Diagnosis not present

## 2020-11-02 DIAGNOSIS — E785 Hyperlipidemia, unspecified: Secondary | ICD-10-CM | POA: Diagnosis not present

## 2020-11-02 DIAGNOSIS — Z7901 Long term (current) use of anticoagulants: Secondary | ICD-10-CM | POA: Diagnosis not present

## 2020-11-02 DIAGNOSIS — I10 Essential (primary) hypertension: Secondary | ICD-10-CM | POA: Diagnosis not present

## 2020-11-03 DIAGNOSIS — I50811 Acute right heart failure: Secondary | ICD-10-CM | POA: Diagnosis not present

## 2020-11-03 DIAGNOSIS — Z20822 Contact with and (suspected) exposure to covid-19: Secondary | ICD-10-CM | POA: Diagnosis present

## 2020-11-03 DIAGNOSIS — D62 Acute posthemorrhagic anemia: Secondary | ICD-10-CM | POA: Diagnosis present

## 2020-11-03 DIAGNOSIS — J449 Chronic obstructive pulmonary disease, unspecified: Secondary | ICD-10-CM | POA: Diagnosis not present

## 2020-11-03 DIAGNOSIS — N186 End stage renal disease: Secondary | ICD-10-CM | POA: Diagnosis present

## 2020-11-03 DIAGNOSIS — J9622 Acute and chronic respiratory failure with hypercapnia: Secondary | ICD-10-CM | POA: Diagnosis present

## 2020-11-03 DIAGNOSIS — E1165 Type 2 diabetes mellitus with hyperglycemia: Secondary | ICD-10-CM | POA: Diagnosis not present

## 2020-11-03 DIAGNOSIS — D638 Anemia in other chronic diseases classified elsewhere: Secondary | ICD-10-CM | POA: Diagnosis present

## 2020-11-03 DIAGNOSIS — M6281 Muscle weakness (generalized): Secondary | ICD-10-CM | POA: Diagnosis present

## 2020-11-03 DIAGNOSIS — I132 Hypertensive heart and chronic kidney disease with heart failure and with stage 5 chronic kidney disease, or end stage renal disease: Secondary | ICD-10-CM | POA: Diagnosis present

## 2020-11-03 DIAGNOSIS — R570 Cardiogenic shock: Secondary | ICD-10-CM | POA: Diagnosis not present

## 2020-11-03 DIAGNOSIS — R7989 Other specified abnormal findings of blood chemistry: Secondary | ICD-10-CM | POA: Diagnosis not present

## 2020-11-03 DIAGNOSIS — G4700 Insomnia, unspecified: Secondary | ICD-10-CM | POA: Diagnosis not present

## 2020-11-03 DIAGNOSIS — I272 Pulmonary hypertension, unspecified: Secondary | ICD-10-CM | POA: Diagnosis present

## 2020-11-03 DIAGNOSIS — R609 Edema, unspecified: Secondary | ICD-10-CM | POA: Diagnosis not present

## 2020-11-03 DIAGNOSIS — I2781 Cor pulmonale (chronic): Secondary | ICD-10-CM | POA: Diagnosis present

## 2020-11-03 DIAGNOSIS — J9611 Chronic respiratory failure with hypoxia: Secondary | ICD-10-CM | POA: Diagnosis not present

## 2020-11-03 DIAGNOSIS — R34 Anuria and oliguria: Secondary | ICD-10-CM | POA: Diagnosis not present

## 2020-11-03 DIAGNOSIS — E662 Morbid (severe) obesity with alveolar hypoventilation: Secondary | ICD-10-CM | POA: Diagnosis present

## 2020-11-03 DIAGNOSIS — R0689 Other abnormalities of breathing: Secondary | ICD-10-CM | POA: Diagnosis not present

## 2020-11-03 DIAGNOSIS — I5023 Acute on chronic systolic (congestive) heart failure: Secondary | ICD-10-CM | POA: Diagnosis not present

## 2020-11-03 DIAGNOSIS — R2689 Other abnormalities of gait and mobility: Secondary | ICD-10-CM | POA: Diagnosis present

## 2020-11-03 DIAGNOSIS — J9601 Acute respiratory failure with hypoxia: Secondary | ICD-10-CM | POA: Diagnosis not present

## 2020-11-03 DIAGNOSIS — K7581 Nonalcoholic steatohepatitis (NASH): Secondary | ICD-10-CM | POA: Diagnosis not present

## 2020-11-03 DIAGNOSIS — G4733 Obstructive sleep apnea (adult) (pediatric): Secondary | ICD-10-CM | POA: Diagnosis present

## 2020-11-03 DIAGNOSIS — N179 Acute kidney failure, unspecified: Secondary | ICD-10-CM | POA: Diagnosis not present

## 2020-11-03 DIAGNOSIS — D61818 Other pancytopenia: Secondary | ICD-10-CM | POA: Diagnosis present

## 2020-11-03 DIAGNOSIS — E782 Mixed hyperlipidemia: Secondary | ICD-10-CM | POA: Diagnosis not present

## 2020-11-03 DIAGNOSIS — G9341 Metabolic encephalopathy: Secondary | ICD-10-CM | POA: Diagnosis present

## 2020-11-03 DIAGNOSIS — I10 Essential (primary) hypertension: Secondary | ICD-10-CM | POA: Diagnosis not present

## 2020-11-03 DIAGNOSIS — K7031 Alcoholic cirrhosis of liver with ascites: Secondary | ICD-10-CM | POA: Diagnosis not present

## 2020-11-03 DIAGNOSIS — K7469 Other cirrhosis of liver: Secondary | ICD-10-CM | POA: Diagnosis present

## 2020-11-03 DIAGNOSIS — Z7189 Other specified counseling: Secondary | ICD-10-CM | POA: Diagnosis not present

## 2020-11-03 DIAGNOSIS — I50813 Acute on chronic right heart failure: Secondary | ICD-10-CM | POA: Diagnosis not present

## 2020-11-03 DIAGNOSIS — I4891 Unspecified atrial fibrillation: Secondary | ICD-10-CM | POA: Diagnosis not present

## 2020-11-03 DIAGNOSIS — D509 Iron deficiency anemia, unspecified: Secondary | ICD-10-CM | POA: Diagnosis not present

## 2020-11-03 DIAGNOSIS — E785 Hyperlipidemia, unspecified: Secondary | ICD-10-CM | POA: Diagnosis present

## 2020-11-03 DIAGNOSIS — Z992 Dependence on renal dialysis: Secondary | ICD-10-CM | POA: Diagnosis not present

## 2020-11-03 DIAGNOSIS — I083 Combined rheumatic disorders of mitral, aortic and tricuspid valves: Secondary | ICD-10-CM | POA: Diagnosis present

## 2020-11-03 DIAGNOSIS — K7460 Unspecified cirrhosis of liver: Secondary | ICD-10-CM | POA: Diagnosis not present

## 2020-11-03 DIAGNOSIS — K219 Gastro-esophageal reflux disease without esophagitis: Secondary | ICD-10-CM | POA: Diagnosis not present

## 2020-11-03 DIAGNOSIS — I5043 Acute on chronic combined systolic (congestive) and diastolic (congestive) heart failure: Secondary | ICD-10-CM | POA: Diagnosis present

## 2020-11-03 DIAGNOSIS — D6959 Other secondary thrombocytopenia: Secondary | ICD-10-CM | POA: Diagnosis present

## 2020-11-03 DIAGNOSIS — N1832 Chronic kidney disease, stage 3b: Secondary | ICD-10-CM | POA: Diagnosis present

## 2020-11-03 DIAGNOSIS — R0902 Hypoxemia: Secondary | ICD-10-CM | POA: Diagnosis not present

## 2020-11-03 DIAGNOSIS — R41841 Cognitive communication deficit: Secondary | ICD-10-CM | POA: Diagnosis present

## 2020-11-03 DIAGNOSIS — R188 Other ascites: Secondary | ICD-10-CM | POA: Diagnosis not present

## 2020-11-03 DIAGNOSIS — I2729 Other secondary pulmonary hypertension: Secondary | ICD-10-CM | POA: Diagnosis present

## 2020-11-03 DIAGNOSIS — C9000 Multiple myeloma not having achieved remission: Secondary | ICD-10-CM | POA: Diagnosis present

## 2020-11-03 DIAGNOSIS — I50810 Right heart failure, unspecified: Secondary | ICD-10-CM | POA: Diagnosis not present

## 2020-11-03 DIAGNOSIS — R0600 Dyspnea, unspecified: Secondary | ICD-10-CM | POA: Diagnosis not present

## 2020-11-03 DIAGNOSIS — I509 Heart failure, unspecified: Secondary | ICD-10-CM | POA: Diagnosis present

## 2020-11-03 DIAGNOSIS — K7290 Hepatic failure, unspecified without coma: Secondary | ICD-10-CM | POA: Diagnosis not present

## 2020-11-03 DIAGNOSIS — J9621 Acute and chronic respiratory failure with hypoxia: Secondary | ICD-10-CM | POA: Diagnosis present

## 2020-11-03 DIAGNOSIS — I5031 Acute diastolic (congestive) heart failure: Secondary | ICD-10-CM | POA: Diagnosis not present

## 2020-11-03 DIAGNOSIS — Z7901 Long term (current) use of anticoagulants: Secondary | ICD-10-CM | POA: Diagnosis not present

## 2020-11-03 DIAGNOSIS — I50812 Chronic right heart failure: Secondary | ICD-10-CM | POA: Diagnosis not present

## 2020-11-03 DIAGNOSIS — I482 Chronic atrial fibrillation, unspecified: Secondary | ICD-10-CM | POA: Diagnosis present

## 2020-11-03 DIAGNOSIS — D696 Thrombocytopenia, unspecified: Secondary | ICD-10-CM | POA: Diagnosis present

## 2020-11-03 DIAGNOSIS — D472 Monoclonal gammopathy: Secondary | ICD-10-CM | POA: Diagnosis not present

## 2020-11-03 DIAGNOSIS — Z6841 Body Mass Index (BMI) 40.0 and over, adult: Secondary | ICD-10-CM | POA: Diagnosis not present

## 2020-11-03 DIAGNOSIS — I700 Atherosclerosis of aorta: Secondary | ICD-10-CM | POA: Diagnosis present

## 2020-11-03 DIAGNOSIS — M109 Gout, unspecified: Secondary | ICD-10-CM | POA: Diagnosis present

## 2020-11-03 DIAGNOSIS — I428 Other cardiomyopathies: Secondary | ICD-10-CM | POA: Diagnosis not present

## 2020-11-03 DIAGNOSIS — E119 Type 2 diabetes mellitus without complications: Secondary | ICD-10-CM | POA: Diagnosis present

## 2020-11-03 DIAGNOSIS — I5033 Acute on chronic diastolic (congestive) heart failure: Secondary | ICD-10-CM | POA: Diagnosis present

## 2020-11-03 DIAGNOSIS — E1122 Type 2 diabetes mellitus with diabetic chronic kidney disease: Secondary | ICD-10-CM | POA: Diagnosis present

## 2020-11-03 DIAGNOSIS — Z515 Encounter for palliative care: Secondary | ICD-10-CM | POA: Diagnosis not present

## 2020-11-05 DIAGNOSIS — Z5181 Encounter for therapeutic drug level monitoring: Secondary | ICD-10-CM | POA: Diagnosis not present

## 2020-11-05 DIAGNOSIS — M109 Gout, unspecified: Secondary | ICD-10-CM | POA: Diagnosis not present

## 2020-11-05 DIAGNOSIS — E1169 Type 2 diabetes mellitus with other specified complication: Secondary | ICD-10-CM | POA: Diagnosis not present

## 2020-11-05 DIAGNOSIS — Z79899 Other long term (current) drug therapy: Secondary | ICD-10-CM | POA: Diagnosis not present

## 2020-11-05 DIAGNOSIS — Z7901 Long term (current) use of anticoagulants: Secondary | ICD-10-CM | POA: Diagnosis not present

## 2020-11-05 DIAGNOSIS — I4891 Unspecified atrial fibrillation: Secondary | ICD-10-CM | POA: Diagnosis not present

## 2020-11-05 DIAGNOSIS — E785 Hyperlipidemia, unspecified: Secondary | ICD-10-CM | POA: Diagnosis not present

## 2020-11-09 DIAGNOSIS — E1169 Type 2 diabetes mellitus with other specified complication: Secondary | ICD-10-CM | POA: Diagnosis not present

## 2020-11-09 DIAGNOSIS — I4891 Unspecified atrial fibrillation: Secondary | ICD-10-CM | POA: Diagnosis not present

## 2020-11-09 DIAGNOSIS — I10 Essential (primary) hypertension: Secondary | ICD-10-CM | POA: Diagnosis not present

## 2020-11-09 DIAGNOSIS — Z5181 Encounter for therapeutic drug level monitoring: Secondary | ICD-10-CM | POA: Diagnosis not present

## 2020-11-09 DIAGNOSIS — Z79899 Other long term (current) drug therapy: Secondary | ICD-10-CM | POA: Diagnosis not present

## 2020-11-09 DIAGNOSIS — Z7901 Long term (current) use of anticoagulants: Secondary | ICD-10-CM | POA: Diagnosis not present

## 2020-11-09 NOTE — Telephone Encounter (Signed)
I called and left a detailed message asked that he please return call regarding upcoming appointment and lab work that needs to be done on tomorrow 11/10/2020.

## 2020-11-11 NOTE — Telephone Encounter (Signed)
Patient not responding to calls supposed to have appt this week.

## 2020-11-12 ENCOUNTER — Other Ambulatory Visit: Payer: Self-pay

## 2020-11-12 ENCOUNTER — Ambulatory Visit (INDEPENDENT_AMBULATORY_CARE_PROVIDER_SITE_OTHER): Payer: Medicare Other | Admitting: Gastroenterology

## 2020-11-12 ENCOUNTER — Encounter (INDEPENDENT_AMBULATORY_CARE_PROVIDER_SITE_OTHER): Payer: Self-pay | Admitting: Gastroenterology

## 2020-11-12 VITALS — BP 114/69 | HR 96 | Temp 98.4°F | Ht 64.0 in

## 2020-11-12 DIAGNOSIS — R188 Other ascites: Secondary | ICD-10-CM | POA: Diagnosis not present

## 2020-11-12 DIAGNOSIS — Z7901 Long term (current) use of anticoagulants: Secondary | ICD-10-CM | POA: Diagnosis not present

## 2020-11-12 DIAGNOSIS — I4891 Unspecified atrial fibrillation: Secondary | ICD-10-CM | POA: Diagnosis not present

## 2020-11-12 DIAGNOSIS — K7581 Nonalcoholic steatohepatitis (NASH): Secondary | ICD-10-CM | POA: Diagnosis not present

## 2020-11-12 DIAGNOSIS — J449 Chronic obstructive pulmonary disease, unspecified: Secondary | ICD-10-CM | POA: Diagnosis not present

## 2020-11-12 DIAGNOSIS — K7460 Unspecified cirrhosis of liver: Secondary | ICD-10-CM | POA: Diagnosis not present

## 2020-11-12 DIAGNOSIS — D509 Iron deficiency anemia, unspecified: Secondary | ICD-10-CM | POA: Diagnosis not present

## 2020-11-12 DIAGNOSIS — Z79899 Other long term (current) drug therapy: Secondary | ICD-10-CM | POA: Diagnosis not present

## 2020-11-12 DIAGNOSIS — Z5181 Encounter for therapeutic drug level monitoring: Secondary | ICD-10-CM | POA: Diagnosis not present

## 2020-11-12 DIAGNOSIS — I10 Essential (primary) hypertension: Secondary | ICD-10-CM | POA: Diagnosis not present

## 2020-11-12 DIAGNOSIS — E1169 Type 2 diabetes mellitus with other specified complication: Secondary | ICD-10-CM | POA: Diagnosis not present

## 2020-11-12 NOTE — Patient Instructions (Signed)
Schedule Agile patency capsule Follow-up with cardiology for clearance to undergo colonoscopy Referral to pulmonology, will need to get clearance to undergo colonoscopy Perform blood workup Continue oral iron once daily Timing of colonoscopy will depend on clearance by cardiology and pulmonology Reduce salt intake to <2 g per day Can take Tylenol max of 2 g per day (650 mg q8h) for pain avoid NSAIDs for pain Avoid eating raw oysters/shellfish Protein shake every night before going to sleep

## 2020-11-12 NOTE — Progress Notes (Signed)
Maylon Peppers, M.D. Gastroenterology & Hepatology [facility] [address] [place], [place] [address]  Primary Care Physician: [name], [name], MD [place] [id number]  I will communicate my assessment and recommendations to the referring MD via EMR.  Problems: Liver cirrhosis, possibly related to NASH Ascites History of colon cancer status post partial colectomy Iron deficiency anemia  History of Present Illness: William Flores is a 70 y.o. male With multiple comorbidities including coronary artery disease status post stent placement, cardiomyopathy with decreased ejection fraction of 35%, atrial fibrillation, CKD, history of colon cancer status post partial colectomy, diabetes, hyperlipidemia, hypertension, CKD and liver cirrhosis c/b ascites, who presents for follow up after recent hospitalization where he was found to have liver cirrhosis and iron deficiency anemia.  The patient was last seen on 07/21/2020. At that time, the patient had CBC, iron stores and INR checked as he had an episode of syncope with drop in his hemoglobin in the setting of supratherapeutic INR.  Due to his multiple comorbidities and clinical status colonoscopy was deferred at that time and he was advised to continue oral iron supplementation. However, the patient was admitted to [facility] on 10/14/2020 after he presented worsening abdominal distention and discomfort in his abdomen.  He was found to have a hemoglobin of 7.1 upon admission and will had positive FOBT.  Underwent an abdominal CT scan that showed nodular contour of his liver with presence of ascites and wall edema.  The patient was diuresed and underwent a colonoscopy on 10/17/2020 with findings described below.  The patient presented rectal bleeding while hospitalized which was considered to be possibly ectomy bleeding that required transfusion of PRBC but it was self-limited  and he did not require any more blood transfusions or colonoscopies.  He was discharged to nursing home with plans to resume his Coumadin on 10/24/2020.  He was also discharged on torsemide 60 mg twice a day and ferrous sulfate 325 mg daily.  The patient underwent repeat blood testing on 10/24/2020 with hemoglobin of 8.2 and MCV of 93.  Notably, his most recent iron stores from 10/14/2020 were 15 for iron, iron saturation of 3% and ferritin of 25.  Patient reported he is feeling better compared to when he went to the hospital.  He denies having any SOB while on oxygen, but states his oxygen had to be increased to 4 L/minute since he was discharged from the hospital. Denies any chest pain or palpitations.  States that his abdominal distention has resolved and he has very mild lower extremity edema.  He only complains of intermittent belching, especially when he is about to eat. The patient denies having any nausea, vomiting, fever, chills, hematochezia, melena, hematemesis, abdominal distention, abdominal pain, diarrhea, jaundice, pruritus or weight loss.  Has a cardiology appointment on 11/20/2020.  Last colonoscopy 10/17/2020 - There was evidence of a prior end-to-side ileo-colonic anastomosis in the ascending colon.  This was patent and was characterized by healthy appearing mucosa.  The anastomosis was traversed. Seven pedunculated and sessile polyps were found in the sigmoid colon, descending colon, transverse colon and ascending colon.  The polyps were 8 to 20 mm in size.  These polyps were removed with a hot snare.  Resection and retrieval were complete.  For hemostasis, three hemostatic clips were successfully placed.  There was no bleeding at the end of the procedure. One of the clips fell off due to maneuvering. Six sessile polyps  were found in the rectum and sigmoid colon.  The polyps were 5 to 6 mm in size.  These polyps were removed with a cold snare.  Resection and retrieval were complete.  For  hemostasis, two hemostatic clips were successfully placed.  There was no bleeding at the end of the procedure.  Five sessile polyps were found in the sigmoid colon, transverse colon and ascending colon.  The polyps were 3 to 5 mm in size.  The retroflexed view of the distal rectum and anal verge was normal and showed no anal or rectal abnormalities.   As part of the work-up for his liver cirrhosis, he underwent recent blood work-up showing negative ANA, AMA, ASMA (initially was mildly elevated but repeat testing was negative), normal IgG of 981, normal ceruloplasmin, negative acute hepatitis panel.  aFP was 1.  He underwent a liver Doppler on 10/15/2020 that showed a patent portal vein with presence of splenomegaly and perihepatic ascites but no presence of masses.  Past Medical History: Past Medical History:  Diagnosis Date   CAD (coronary artery disease)    LHC 2/08:  mRCA 50%, o/w no sig CAD, EF 25%   Cardiomyopathy ([place])    EF 35-40% in 2/16 in setting of AF with RVR >> echo 5/16 with improved LVF with EF 65-70%   Chronic atrial fibrillation (HCC)    Chronic diastolic heart failure (HCC)    HFPF - EF ~65-70% (OFTEN EXACERBATED BY AFIB)   CKD (chronic kidney disease)    hx of worsening renal failure and hyperK+ in setting of acute diast CHF >> required CVVHD in 4/16 and dialysis x 1 in 5/16   CKD stage 3 due to type 2 diabetes mellitus ([place]) 09/17/2015   Colon cancer ([place])    Colon polyps 09/21/2012   Tubular adenoma   Diabetes ([place])    Hyperlipidemia    Hypertension    Obesity hypoventilation syndrome ([place])    Renal insufficiency    Sleep apnea     Past Surgical History: Past Surgical History:  Procedure Laterality Date   CIRCUMCISION     COLON RESECTION     COLONOSCOPY N/A 09/21/2012   Procedure: COLONOSCOPY;  Surgeon: Inda Castle, MD;  Location: [facility] ENDOSCOPY;  Service: Endoscopy;  Laterality: N/A;   COLONOSCOPY N/A 12/28/2018   Procedure: COLONOSCOPY;  Surgeon: Rogene Houston, MD;  Location: [facility] END[facility];  Service: Endoscopy;  Laterality: N/A;   COLONOSCOPY WITH PROPOFOL N/A 10/17/2020   Procedure: COLONOSCOPY WITH PROPOFOL;  Surgeon: Harvel Quale, MD;  Location: [facility];  Service: Gastroenterology;  Laterality: N/A;   ENTEROSCOPY N/A 06/24/2020   Procedure: ENTEROSCOPY;  Surgeon: Gatha Mayer, MD;  Location: [facility] ENDOSCOPY;  Service: Endoscopy;  Laterality: N/A;   ESOPHAGOGASTRODUODENOSCOPY N/A 12/28/2018   Procedure: ESOPHAGOGASTRODUODENOSCOPY (EGD);  Surgeon: Rogene Houston, MD;  Location: [facility];  Service: Endoscopy;  Laterality: N/A;   POLYPECTOMY  10/17/2020   Procedure: POLYPECTOMY;  Surgeon: Harvel Quale, MD;  Location: [facility];  Service: Gastroenterology;;  rectal, transverse, descending, ascending, sigmoid   RIGHT HEART CATH N/A 04/11/2019   Procedure: RIGHT HEART CATH;  Surgeon: Larey Dresser, MD;  Location: [facility];  Service: Cardiovascular;  Laterality: N/A;   US ECHOCARDIOGRAPHY  03/04/2010   abnormal study    Family History: Family History  Problem Relation Age of Onset   Breast cancer Mother    Diabetes Mother    Heart attack Father  Social History: Social History   Tobacco Use  Smoking Status Former   Types: Cigarettes   Quit date: 08/06/1983   Years since quitting: 37.2  Smokeless Tobacco Never   Social History   Substance and Sexual Activity  Alcohol Use No   Social History   Substance and Sexual Activity  Drug Use No    Allergies: Allergies  Allergen Reactions   Codeine     Headache     Medications: Current Outpatient Medications  Medication Sig Dispense Refill   acetaminophen (TYLENOL) 325 MG tablet Take 2 tablets (650 mg total) by mouth every 6 (six) hours as needed for mild pain (or Fever >/= 101). 12 tablet 0   allopurinol (ZYLOPRIM) 300 MG tablet Take 1 tablet (300 mg total) by mouth daily. (Patient taking differently: Take 300 mg by mouth 2  (two) times daily.) 90 tablet 3   atorvastatin (LIPITOR) 20 MG tablet TAKE 1 TABLET BY MOUTH EVERY DAY (Patient taking differently: Take 20 mg by mouth daily.) 90 tablet 3   benzonatate (TESSALON) 100 MG capsule Take 1 capsule (100 mg total) by mouth 2 (two) times daily as needed for cough.     Dulaglutide (TRULICITY) 1.5 GL/8.7FI SOPN Inject 1.5 mg into the skin once a week. 6 mL 3   ertugliflozin L-PyroglutamicAc (STEGLATRO) 5 MG TABS tablet Take 5 mg by mouth daily.     ferrous sulfate 325 (65 FE) MG tablet Take 325 mg by mouth daily with breakfast.      glucose blood (CONTOUR NEXT TEST) test strip Test BS TID Dx E11.29 300 strip 3   Insulin Glargine (LANTUS SOLOSTAR [place]) Inject into the skin. 16 units     insulin glargine-yfgn (SEMGLEE) 100 UNIT/ML Pen Inject 16 Units into the skin at bedtime.     Insulin Pen Needle 32G X 4 MM MISC Use to give insulin daily Dx E11.29 100 each 3   melatonin 5 MG TABS Take 5 mg by mouth at bedtime as needed.     metoprolol succinate (TOPROL-XL) 25 MG 24 hr tablet Take 1 tablet (25 mg total) by mouth daily.     OXYGEN Inhale into the lungs. Continuous     pantoprazole (PROTONIX) 40 MG tablet TAKE 1 TABLET BY MOUTH EVERY DAY 90 tablet 1   polyethylene glycol (MIRALAX / GLYCOLAX) 17 g packet Take 17 g by mouth daily. 14 each 0   sildenafil (REVATIO) 20 MG tablet Take 1 tablet (20 mg total) by mouth 3 (three) times daily. 90 tablet 11   torsemide (DEMADEX) 20 MG tablet Take 3 tablets (60 mg total) by mouth 2 (two) times daily. 540 tablet 3   warfarin (COUMADIN) 7.5 MG tablet Take 7.5 mg by mouth daily.     nystatin (MYCOSTATIN) 100000 UNIT/ML suspension Take 5 mLs by mouth 4 (four) times daily.     No current facility-administered medications for this visit.    Review of Systems: GENERAL: negative for malaise, night sweats HEENT: No changes in hearing or vision, no nose bleeds or other nasal problems. NECK: Negative for lumps, goiter, pain and significant neck  swelling RESPIRATORY: Negative for cough, wheezing CARDIOVASCULAR: Negative for chest pain, leg swelling, palpitations, orthopnea GI: SEE HPI MUSCULOSKELETAL: Negative for joint pain or swelling, back pain, and muscle pain. SKIN: Negative for lesions, rash PSYCH: Negative for sleep disturbance, mood disorder and recent psychosocial stressors. HEMATOLOGY Negative for prolonged bleeding, bruising easily, and swollen nodes. ENDOCRINE: Negative for cold or heat intolerance, polyuria, polydipsia and  goiter. NEURO: negative for tremor, gait imbalance, syncope and seizures. The remainder of the review of systems is noncontributory.   Physical Exam: BP 114/69 (BP Location: Right Arm, Patient Position: Sitting, Cuff Size: Large)   Pulse 96   Temp 98.4 F (36.9 C) (Oral)   Ht 5' 4"  (1.626 m)   BMI 44.97 kg/m  GENERAL: The patient is AO x3, in no acute distress. Uses oxygen via [place]. Obese. HEENT: Head is normocephalic and atraumatic. EOMI are intact. Mouth is well hydrated and without lesions. NECK: Supple. No masses LUNGS: Clear to auscultation. No presence of rhonchi/wheezing/rales. Adequate chest expansion HEART: RRR, normal s1 and s2. ABDOMEN: Soft, nontender, no guarding, no peritoneal signs, and nondistended. BS +. No masses. EXTREMITIES: Without any cyanosis, clubbing, rash, lesions.  +1 pitting edema in both lower extremities up to his knees. NEUROLOGIC: AOx3, no focal motor deficit. SKIN: no jaundice, no rashes  Imaging/Labs: as above  I personally reviewed and interpreted the available labs, imaging and endoscopic files.  Impression and Plan: DACOTA RUBEN is a 70 y.o. male With multiple comorbidities including coronary artery disease status post stent placement, cardiomyopathy with decreased ejection fraction of 35%, atrial fibrillation, CKD, history of colon cancer status post partial colectomy, diabetes, hyperlipidemia, hypertension, CKD and liver cirrhosis c/b ascites, who  presents for follow up after recent hospitalization where he was found to have liver cirrhosis and iron deficiency anemia.  The patient did not have any evidence of overt gastrointestinal bleeding prior to his colonoscopy has presented chronic iron deficiency anemia.  He has never been on oral iron supplementation which he should continue for now and we will assess his response to this medication in the next few months.  Depending on his response he may need to have oral iron supplementation.  He underwent a colonoscopy and a push enteroscopy without presence of abnormalities that would explain the anemia he has presented, although there was a polyp that had inflammatory changes that could account for it.  We will consider performing a capsule endoscopy but first he will need to have an agile patency capsule given his history of surgeries and depending on findings we will set up the capsule endoscopy.  The patient understood and agreed.    Patient has a new diagnosis of liver cirrhosis, likely related to NASH.  He is a CPT B class with his most recent MELD score of 18. overall, the patient has a complex past medical history which makes him a poor candidate for transplant given his severe lung disease and cardiomyopathy.  I doubt that he will be accepted to have a liver transplant at any tertiary center.  We will update his meld labs today.  He will also need to have hepatitis A/B antibodies checked to assess his vaccination status.  He is up-to-date regarding his esophageal varices and HCC screening.  He does not have any evidence of significant clinical third spacing and should continue with his current dose of torsemide.  No signs of encephalopathy.  Finally, I do not consider that he is optimized to undergo a repeat colonoscopy yet to remove the small polyps that were not removed during his most recent colonoscopy.  I would like him to have optimization of his cardiac and pulmonary status, he will need to  follow with his cardiologist and I will refer him to pulmonology for these purposes.  We will readdress if he is a candidate to pursue this type of procedure.  - Schedule Agile patency capsule -  Follow-up with cardiology for clearance to undergo colonoscopy - Referral to pulmonology, will need to get clearance to undergo colonoscopy - Check CBC, CMP, iron stores, hepatitis A and B vaccination status - Continue oral iron once daily - Timing of colonoscopy will depend on clearance by cardiology and pulmonology - Reduce salt intake to <2 g per day - Can take Tylenol max of 2 g per day (650 mg q8h) for pain avoid NSAIDs for pain - Avoid eating raw oysters/shellfish - Protein shake every night before going to sleep  All questions were answered.      Harvel Quale, MD Gastroenterology and Hepatology [facility]

## 2020-11-16 DIAGNOSIS — I10 Essential (primary) hypertension: Secondary | ICD-10-CM | POA: Diagnosis not present

## 2020-11-16 DIAGNOSIS — R5381 Other malaise: Secondary | ICD-10-CM | POA: Diagnosis not present

## 2020-11-16 DIAGNOSIS — E1169 Type 2 diabetes mellitus with other specified complication: Secondary | ICD-10-CM | POA: Diagnosis not present

## 2020-11-16 DIAGNOSIS — R2689 Other abnormalities of gait and mobility: Secondary | ICD-10-CM | POA: Diagnosis not present

## 2020-11-16 DIAGNOSIS — Z5181 Encounter for therapeutic drug level monitoring: Secondary | ICD-10-CM | POA: Diagnosis not present

## 2020-11-16 DIAGNOSIS — M6281 Muscle weakness (generalized): Secondary | ICD-10-CM | POA: Diagnosis not present

## 2020-11-16 DIAGNOSIS — M109 Gout, unspecified: Secondary | ICD-10-CM | POA: Diagnosis not present

## 2020-11-16 DIAGNOSIS — Z79899 Other long term (current) drug therapy: Secondary | ICD-10-CM | POA: Diagnosis not present

## 2020-11-16 DIAGNOSIS — I5033 Acute on chronic diastolic (congestive) heart failure: Secondary | ICD-10-CM | POA: Diagnosis not present

## 2020-11-16 DIAGNOSIS — I4891 Unspecified atrial fibrillation: Secondary | ICD-10-CM | POA: Diagnosis not present

## 2020-11-16 DIAGNOSIS — Z7901 Long term (current) use of anticoagulants: Secondary | ICD-10-CM | POA: Diagnosis not present

## 2020-11-16 LAB — CBC WITH DIFFERENTIAL/PLATELET
Absolute Monocytes: 431 cells/uL (ref 200–950)
Basophils Absolute: 29 cells/uL (ref 0–200)
Basophils Relative: 0.7 %
Eosinophils Absolute: 111 cells/uL (ref 15–500)
Eosinophils Relative: 2.7 %
HCT: 32.7 % — ABNORMAL LOW (ref 38.5–50.0)
Hemoglobin: 9 g/dL — ABNORMAL LOW (ref 13.2–17.1)
Lymphs Abs: 484 cells/uL — ABNORMAL LOW (ref 850–3900)
MCH: 25 pg — ABNORMAL LOW (ref 27.0–33.0)
MCHC: 27.5 g/dL — ABNORMAL LOW (ref 32.0–36.0)
MCV: 90.8 fL (ref 80.0–100.0)
MPV: 11.9 fL (ref 7.5–12.5)
Monocytes Relative: 10.5 %
Neutro Abs: 3046 cells/uL (ref 1500–7800)
Neutrophils Relative %: 74.3 %
Platelets: 138 10*3/uL — ABNORMAL LOW (ref 140–400)
RBC: 3.6 10*6/uL — ABNORMAL LOW (ref 4.20–5.80)
RDW: 17.4 % — ABNORMAL HIGH (ref 11.0–15.0)
Total Lymphocyte: 11.8 %
WBC: 4.1 10*3/uL (ref 3.8–10.8)

## 2020-11-16 LAB — COMPREHENSIVE METABOLIC PANEL
AG Ratio: 1.3 (calc) (ref 1.0–2.5)
ALT: 30 U/L (ref 9–46)
AST: 33 U/L (ref 10–35)
Albumin: 3.5 g/dL — ABNORMAL LOW (ref 3.6–5.1)
Alkaline phosphatase (APISO): 80 U/L (ref 35–144)
BUN/Creatinine Ratio: 25 (calc) — ABNORMAL HIGH (ref 6–22)
BUN: 55 mg/dL — ABNORMAL HIGH (ref 7–25)
CO2: 26 mmol/L (ref 20–32)
Calcium: 8.4 mg/dL — ABNORMAL LOW (ref 8.6–10.3)
Chloride: 103 mmol/L (ref 98–110)
Creat: 2.22 mg/dL — ABNORMAL HIGH (ref 0.70–1.35)
Globulin: 2.8 g/dL (calc) (ref 1.9–3.7)
Glucose, Bld: 173 mg/dL — ABNORMAL HIGH (ref 65–99)
Potassium: 4.1 mmol/L (ref 3.5–5.3)
Sodium: 140 mmol/L (ref 135–146)
Total Bilirubin: 0.7 mg/dL (ref 0.2–1.2)
Total Protein: 6.3 g/dL (ref 6.1–8.1)

## 2020-11-16 LAB — PROTIME-INR
INR: 1.7 — ABNORMAL HIGH
Prothrombin Time: 16.6 s — ABNORMAL HIGH (ref 9.0–11.5)

## 2020-11-16 LAB — HEPATITIS B SURFACE ANTIBODY,QUALITATIVE: Hep B S Ab: REACTIVE — AB

## 2020-11-16 LAB — IRON, TOTAL/TOTAL IRON BINDING CAP
%SAT: 6 % (calc) — ABNORMAL LOW (ref 20–48)
Iron: 22 ug/dL — ABNORMAL LOW (ref 50–180)
TIBC: 355 mcg/dL (calc) (ref 250–425)

## 2020-11-16 LAB — HEPATITIS A ANTIBODY, TOTAL: Hepatitis A AB,Total: REACTIVE — AB

## 2020-11-16 LAB — ANA: Anti Nuclear Antibody (ANA): POSITIVE — AB

## 2020-11-16 LAB — CERULOPLASMIN: Ceruloplasmin: 29 mg/dL (ref 18–36)

## 2020-11-16 LAB — FERRITIN: Ferritin: 45 ng/mL (ref 24–380)

## 2020-11-16 LAB — ANTI-NUCLEAR AB-TITER (ANA TITER): ANA Titer 1: 1:80 {titer} — ABNORMAL HIGH

## 2020-11-16 LAB — ANTI-SMOOTH MUSCLE ANTIBODY, IGG: Actin (Smooth Muscle) Antibody (IGG): <20 U (ref <20)

## 2020-11-17 DIAGNOSIS — R2689 Other abnormalities of gait and mobility: Secondary | ICD-10-CM | POA: Diagnosis not present

## 2020-11-17 DIAGNOSIS — M6281 Muscle weakness (generalized): Secondary | ICD-10-CM | POA: Diagnosis not present

## 2020-11-17 DIAGNOSIS — I5033 Acute on chronic diastolic (congestive) heart failure: Secondary | ICD-10-CM | POA: Diagnosis not present

## 2020-11-18 ENCOUNTER — Encounter (INDEPENDENT_AMBULATORY_CARE_PROVIDER_SITE_OTHER): Payer: Self-pay

## 2020-11-18 DIAGNOSIS — I5033 Acute on chronic diastolic (congestive) heart failure: Secondary | ICD-10-CM | POA: Diagnosis not present

## 2020-11-18 DIAGNOSIS — R2689 Other abnormalities of gait and mobility: Secondary | ICD-10-CM | POA: Diagnosis not present

## 2020-11-18 DIAGNOSIS — M6281 Muscle weakness (generalized): Secondary | ICD-10-CM | POA: Diagnosis not present

## 2020-11-19 DIAGNOSIS — R2689 Other abnormalities of gait and mobility: Secondary | ICD-10-CM | POA: Diagnosis not present

## 2020-11-19 DIAGNOSIS — M6281 Muscle weakness (generalized): Secondary | ICD-10-CM | POA: Diagnosis not present

## 2020-11-19 DIAGNOSIS — I5033 Acute on chronic diastolic (congestive) heart failure: Secondary | ICD-10-CM | POA: Diagnosis not present

## 2020-11-20 ENCOUNTER — Other Ambulatory Visit: Payer: Self-pay

## 2020-11-20 ENCOUNTER — Encounter (HOSPITAL_COMMUNITY): Payer: Self-pay | Admitting: Cardiology

## 2020-11-20 ENCOUNTER — Ambulatory Visit (HOSPITAL_BASED_OUTPATIENT_CLINIC_OR_DEPARTMENT_OTHER)
Admission: RE | Admit: 2020-11-20 | Discharge: 2020-11-20 | Disposition: A | Discharge status: Home / Self Care | Payer: Medicare Other | Source: Ambulatory Visit | Attending: Cardiology | Admitting: Cardiology

## 2020-11-20 VITALS — BP 140/80 | HR 92 | Wt 287.0 lb

## 2020-11-20 DIAGNOSIS — I50812 Chronic right heart failure: Secondary | ICD-10-CM

## 2020-11-20 DIAGNOSIS — I5033 Acute on chronic diastolic (congestive) heart failure: Secondary | ICD-10-CM | POA: Diagnosis not present

## 2020-11-20 DIAGNOSIS — K760 Fatty (change of) liver, not elsewhere classified: Secondary | ICD-10-CM | POA: Insufficient documentation

## 2020-11-20 DIAGNOSIS — Z85038 Personal history of other malignant neoplasm of large intestine: Secondary | ICD-10-CM | POA: Insufficient documentation

## 2020-11-20 DIAGNOSIS — G9341 Metabolic encephalopathy: Secondary | ICD-10-CM | POA: Diagnosis not present

## 2020-11-20 DIAGNOSIS — I130 Hypertensive heart and chronic kidney disease with heart failure and stage 1 through stage 4 chronic kidney disease, or unspecified chronic kidney disease: Secondary | ICD-10-CM | POA: Insufficient documentation

## 2020-11-20 DIAGNOSIS — Z515 Encounter for palliative care: Secondary | ICD-10-CM | POA: Diagnosis not present

## 2020-11-20 DIAGNOSIS — N179 Acute kidney failure, unspecified: Secondary | ICD-10-CM | POA: Diagnosis not present

## 2020-11-20 DIAGNOSIS — D472 Monoclonal gammopathy: Secondary | ICD-10-CM

## 2020-11-20 DIAGNOSIS — R0602 Shortness of breath: Secondary | ICD-10-CM | POA: Diagnosis not present

## 2020-11-20 DIAGNOSIS — M6281 Muscle weakness (generalized): Secondary | ICD-10-CM | POA: Diagnosis not present

## 2020-11-20 DIAGNOSIS — I428 Other cardiomyopathies: Secondary | ICD-10-CM

## 2020-11-20 DIAGNOSIS — E1122 Type 2 diabetes mellitus with diabetic chronic kidney disease: Secondary | ICD-10-CM | POA: Insufficient documentation

## 2020-11-20 DIAGNOSIS — J90 Pleural effusion, not elsewhere classified: Secondary | ICD-10-CM | POA: Diagnosis not present

## 2020-11-20 DIAGNOSIS — I110 Hypertensive heart disease with heart failure: Secondary | ICD-10-CM | POA: Diagnosis not present

## 2020-11-20 DIAGNOSIS — I132 Hypertensive heart and chronic kidney disease with heart failure and with stage 5 chronic kidney disease, or end stage renal disease: Secondary | ICD-10-CM | POA: Diagnosis not present

## 2020-11-20 DIAGNOSIS — Z20822 Contact with and (suspected) exposure to covid-19: Secondary | ICD-10-CM | POA: Diagnosis not present

## 2020-11-20 DIAGNOSIS — R2689 Other abnormalities of gait and mobility: Secondary | ICD-10-CM | POA: Diagnosis not present

## 2020-11-20 DIAGNOSIS — Z79899 Other long term (current) drug therapy: Secondary | ICD-10-CM | POA: Insufficient documentation

## 2020-11-20 DIAGNOSIS — Z9981 Dependence on supplemental oxygen: Secondary | ICD-10-CM | POA: Insufficient documentation

## 2020-11-20 DIAGNOSIS — I5043 Acute on chronic combined systolic (congestive) and diastolic (congestive) heart failure: Secondary | ICD-10-CM | POA: Diagnosis not present

## 2020-11-20 DIAGNOSIS — I509 Heart failure, unspecified: Secondary | ICD-10-CM | POA: Diagnosis not present

## 2020-11-20 DIAGNOSIS — I272 Pulmonary hypertension, unspecified: Secondary | ICD-10-CM | POA: Diagnosis not present

## 2020-11-20 DIAGNOSIS — I482 Chronic atrial fibrillation, unspecified: Secondary | ICD-10-CM | POA: Insufficient documentation

## 2020-11-20 DIAGNOSIS — I517 Cardiomegaly: Secondary | ICD-10-CM | POA: Diagnosis not present

## 2020-11-20 DIAGNOSIS — J9621 Acute and chronic respiratory failure with hypoxia: Secondary | ICD-10-CM | POA: Diagnosis not present

## 2020-11-20 DIAGNOSIS — Z862 Personal history of diseases of the blood and blood-forming organs and certain disorders involving the immune mechanism: Secondary | ICD-10-CM | POA: Insufficient documentation

## 2020-11-20 DIAGNOSIS — Z7901 Long term (current) use of anticoagulants: Secondary | ICD-10-CM | POA: Insufficient documentation

## 2020-11-20 DIAGNOSIS — G4733 Obstructive sleep apnea (adult) (pediatric): Secondary | ICD-10-CM | POA: Insufficient documentation

## 2020-11-20 DIAGNOSIS — I5042 Chronic combined systolic (congestive) and diastolic (congestive) heart failure: Secondary | ICD-10-CM | POA: Insufficient documentation

## 2020-11-20 DIAGNOSIS — I2729 Other secondary pulmonary hypertension: Secondary | ICD-10-CM | POA: Insufficient documentation

## 2020-11-20 LAB — CBC
HCT: 33.9 % — ABNORMAL LOW (ref 39.0–52.0)
Hemoglobin: 9.1 g/dL — ABNORMAL LOW (ref 13.0–17.0)
MCH: 25.1 pg — ABNORMAL LOW (ref 26.0–34.0)
MCHC: 26.8 g/dL — ABNORMAL LOW (ref 30.0–36.0)
MCV: 93.4 fL (ref 80.0–100.0)
Platelets: 93 10*3/uL — ABNORMAL LOW (ref 150–400)
RBC: 3.63 MIL/uL — ABNORMAL LOW (ref 4.22–5.81)
RDW: 20 % — ABNORMAL HIGH (ref 11.5–15.5)
WBC: 4.4 10*3/uL (ref 4.0–10.5)
nRBC: 0 % (ref 0.0–0.2)

## 2020-11-20 LAB — BASIC METABOLIC PANEL
Anion gap: 5 (ref 5–15)
BUN: 52 mg/dL — ABNORMAL HIGH (ref 8–23)
CO2: 31 mmol/L (ref 22–32)
Calcium: 8.5 mg/dL — ABNORMAL LOW (ref 8.9–10.3)
Chloride: 103 mmol/L (ref 98–111)
Creatinine, Ser: 2.09 mg/dL — ABNORMAL HIGH (ref 0.61–1.24)
GFR, Estimated: 34 mL/min — ABNORMAL LOW (ref >60)
Glucose, Bld: 108 mg/dL — ABNORMAL HIGH (ref 70–99)
Potassium: 3.9 mmol/L (ref 3.5–5.1)
Sodium: 139 mmol/L (ref 135–145)

## 2020-11-20 LAB — BRAIN NATRIURETIC PEPTIDE: B Natriuretic Peptide: 306.1 pg/mL — ABNORMAL HIGH (ref 0.0–100.0)

## 2020-11-20 NOTE — Patient Instructions (Signed)
Follow orders sent back to [facility] in [place]:  Increase Torsemide to 80 mg Twice daily  Take Metolazone 2.5 mg on Sat 11/19 and Sun 11/20, then every Saturday Take KCL 20 meq with Metolazone Labs done at our office today Needs repeat labs in 10 days (BMP) please fax results to our office at [phone] Your physician recommends that you schedule a follow-up appointment in: 3 weeks (Wed 12/14 at 11:30 AM)

## 2020-11-22 ENCOUNTER — Inpatient Hospital Stay (HOSPITAL_COMMUNITY): Payer: Medicare Other

## 2020-11-22 ENCOUNTER — Inpatient Hospital Stay (HOSPITAL_COMMUNITY)
Admission: EM | Admit: 2020-11-22 | Discharge: 2020-12-11 | DRG: 270 | Disposition: A | Discharge status: Skilled Nursing Facility | Payer: Medicare Other | Attending: Internal Medicine | Admitting: Internal Medicine

## 2020-11-22 ENCOUNTER — Encounter (HOSPITAL_COMMUNITY): Payer: Self-pay

## 2020-11-22 ENCOUNTER — Other Ambulatory Visit: Payer: Self-pay

## 2020-11-22 ENCOUNTER — Emergency Department (HOSPITAL_COMMUNITY): Payer: Medicare Other

## 2020-11-22 DIAGNOSIS — N179 Acute kidney failure, unspecified: Secondary | ICD-10-CM | POA: Diagnosis not present

## 2020-11-22 DIAGNOSIS — Z885 Allergy status to narcotic agent status: Secondary | ICD-10-CM

## 2020-11-22 DIAGNOSIS — I4821 Permanent atrial fibrillation: Secondary | ICD-10-CM | POA: Diagnosis not present

## 2020-11-22 DIAGNOSIS — Z833 Family history of diabetes mellitus: Secondary | ICD-10-CM

## 2020-11-22 DIAGNOSIS — I083 Combined rheumatic disorders of mitral, aortic and tricuspid valves: Secondary | ICD-10-CM | POA: Diagnosis present

## 2020-11-22 DIAGNOSIS — E662 Morbid (severe) obesity with alveolar hypoventilation: Secondary | ICD-10-CM | POA: Diagnosis present

## 2020-11-22 DIAGNOSIS — N186 End stage renal disease: Secondary | ICD-10-CM | POA: Diagnosis not present

## 2020-11-22 DIAGNOSIS — I50812 Chronic right heart failure: Secondary | ICD-10-CM | POA: Diagnosis present

## 2020-11-22 DIAGNOSIS — I10 Essential (primary) hypertension: Secondary | ICD-10-CM | POA: Diagnosis not present

## 2020-11-22 DIAGNOSIS — E039 Hypothyroidism, unspecified: Secondary | ICD-10-CM | POA: Diagnosis not present

## 2020-11-22 DIAGNOSIS — M79641 Pain in right hand: Secondary | ICD-10-CM | POA: Diagnosis not present

## 2020-11-22 DIAGNOSIS — Z7901 Long term (current) use of anticoagulants: Secondary | ICD-10-CM | POA: Diagnosis not present

## 2020-11-22 DIAGNOSIS — I509 Heart failure, unspecified: Secondary | ICD-10-CM | POA: Diagnosis not present

## 2020-11-22 DIAGNOSIS — R34 Anuria and oliguria: Secondary | ICD-10-CM | POA: Diagnosis not present

## 2020-11-22 DIAGNOSIS — Z6841 Body Mass Index (BMI) 40.0 and over, adult: Secondary | ICD-10-CM

## 2020-11-22 DIAGNOSIS — C9000 Multiple myeloma not having achieved remission: Secondary | ICD-10-CM | POA: Diagnosis present

## 2020-11-22 DIAGNOSIS — K7031 Alcoholic cirrhosis of liver with ascites: Secondary | ICD-10-CM

## 2020-11-22 DIAGNOSIS — R29898 Other symptoms and signs involving the musculoskeletal system: Secondary | ICD-10-CM | POA: Diagnosis not present

## 2020-11-22 DIAGNOSIS — L299 Pruritus, unspecified: Secondary | ICD-10-CM | POA: Diagnosis present

## 2020-11-22 DIAGNOSIS — N5089 Other specified disorders of the male genital organs: Secondary | ICD-10-CM | POA: Diagnosis present

## 2020-11-22 DIAGNOSIS — I2729 Other secondary pulmonary hypertension: Secondary | ICD-10-CM | POA: Diagnosis not present

## 2020-11-22 DIAGNOSIS — I700 Atherosclerosis of aorta: Secondary | ICD-10-CM | POA: Diagnosis present

## 2020-11-22 DIAGNOSIS — J811 Chronic pulmonary edema: Secondary | ICD-10-CM | POA: Diagnosis not present

## 2020-11-22 DIAGNOSIS — I482 Chronic atrial fibrillation, unspecified: Secondary | ICD-10-CM | POA: Diagnosis not present

## 2020-11-22 DIAGNOSIS — J9622 Acute and chronic respiratory failure with hypercapnia: Secondary | ICD-10-CM | POA: Diagnosis present

## 2020-11-22 DIAGNOSIS — Z992 Dependence on renal dialysis: Secondary | ICD-10-CM | POA: Diagnosis not present

## 2020-11-22 DIAGNOSIS — I110 Hypertensive heart disease with heart failure: Secondary | ICD-10-CM | POA: Diagnosis not present

## 2020-11-22 DIAGNOSIS — G9341 Metabolic encephalopathy: Secondary | ICD-10-CM | POA: Diagnosis present

## 2020-11-22 DIAGNOSIS — N1832 Chronic kidney disease, stage 3b: Secondary | ICD-10-CM

## 2020-11-22 DIAGNOSIS — I5043 Acute on chronic combined systolic (congestive) and diastolic (congestive) heart failure: Secondary | ICD-10-CM | POA: Diagnosis present

## 2020-11-22 DIAGNOSIS — R0689 Other abnormalities of breathing: Secondary | ICD-10-CM

## 2020-11-22 DIAGNOSIS — K7460 Unspecified cirrhosis of liver: Secondary | ICD-10-CM | POA: Diagnosis present

## 2020-11-22 DIAGNOSIS — R1909 Other intra-abdominal and pelvic swelling, mass and lump: Secondary | ICD-10-CM | POA: Diagnosis present

## 2020-11-22 DIAGNOSIS — M109 Gout, unspecified: Secondary | ICD-10-CM | POA: Diagnosis present

## 2020-11-22 DIAGNOSIS — K7581 Nonalcoholic steatohepatitis (NASH): Secondary | ICD-10-CM | POA: Diagnosis not present

## 2020-11-22 DIAGNOSIS — Z7401 Bed confinement status: Secondary | ICD-10-CM | POA: Diagnosis not present

## 2020-11-22 DIAGNOSIS — D696 Thrombocytopenia, unspecified: Secondary | ICD-10-CM

## 2020-11-22 DIAGNOSIS — Z8249 Family history of ischemic heart disease and other diseases of the circulatory system: Secondary | ICD-10-CM

## 2020-11-22 DIAGNOSIS — D638 Anemia in other chronic diseases classified elsewhere: Secondary | ICD-10-CM | POA: Diagnosis present

## 2020-11-22 DIAGNOSIS — I50810 Right heart failure, unspecified: Secondary | ICD-10-CM | POA: Diagnosis present

## 2020-11-22 DIAGNOSIS — G4733 Obstructive sleep apnea (adult) (pediatric): Secondary | ICD-10-CM | POA: Diagnosis present

## 2020-11-22 DIAGNOSIS — I517 Cardiomegaly: Secondary | ICD-10-CM | POA: Diagnosis not present

## 2020-11-22 DIAGNOSIS — E871 Hypo-osmolality and hyponatremia: Secondary | ICD-10-CM | POA: Diagnosis present

## 2020-11-22 DIAGNOSIS — L8990 Pressure ulcer of unspecified site, unspecified stage: Secondary | ICD-10-CM | POA: Insufficient documentation

## 2020-11-22 DIAGNOSIS — E782 Mixed hyperlipidemia: Secondary | ICD-10-CM | POA: Diagnosis not present

## 2020-11-22 DIAGNOSIS — I4891 Unspecified atrial fibrillation: Secondary | ICD-10-CM

## 2020-11-22 DIAGNOSIS — J90 Pleural effusion, not elsewhere classified: Secondary | ICD-10-CM | POA: Diagnosis not present

## 2020-11-22 DIAGNOSIS — Z8719 Personal history of other diseases of the digestive system: Secondary | ICD-10-CM

## 2020-11-22 DIAGNOSIS — G4700 Insomnia, unspecified: Secondary | ICD-10-CM | POA: Diagnosis not present

## 2020-11-22 DIAGNOSIS — K7682 Hepatic encephalopathy: Secondary | ICD-10-CM | POA: Diagnosis present

## 2020-11-22 DIAGNOSIS — N183 Chronic kidney disease, stage 3 unspecified: Secondary | ICD-10-CM

## 2020-11-22 DIAGNOSIS — I5033 Acute on chronic diastolic (congestive) heart failure: Secondary | ICD-10-CM

## 2020-11-22 DIAGNOSIS — E1129 Type 2 diabetes mellitus with other diabetic kidney complication: Secondary | ICD-10-CM | POA: Diagnosis present

## 2020-11-22 DIAGNOSIS — R262 Difficulty in walking, not elsewhere classified: Secondary | ICD-10-CM | POA: Diagnosis not present

## 2020-11-22 DIAGNOSIS — R188 Other ascites: Secondary | ICD-10-CM | POA: Diagnosis not present

## 2020-11-22 DIAGNOSIS — R7989 Other specified abnormal findings of blood chemistry: Secondary | ICD-10-CM

## 2020-11-22 DIAGNOSIS — I132 Hypertensive heart and chronic kidney disease with heart failure and with stage 5 chronic kidney disease, or end stage renal disease: Principal | ICD-10-CM | POA: Diagnosis present

## 2020-11-22 DIAGNOSIS — R570 Cardiogenic shock: Secondary | ICD-10-CM | POA: Diagnosis not present

## 2020-11-22 DIAGNOSIS — I272 Pulmonary hypertension, unspecified: Secondary | ICD-10-CM | POA: Diagnosis present

## 2020-11-22 DIAGNOSIS — J449 Chronic obstructive pulmonary disease, unspecified: Secondary | ICD-10-CM | POA: Diagnosis present

## 2020-11-22 DIAGNOSIS — J9621 Acute and chronic respiratory failure with hypoxia: Secondary | ICD-10-CM | POA: Diagnosis present

## 2020-11-22 DIAGNOSIS — J9611 Chronic respiratory failure with hypoxia: Secondary | ICD-10-CM | POA: Diagnosis not present

## 2020-11-22 DIAGNOSIS — I50811 Acute right heart failure: Secondary | ICD-10-CM | POA: Diagnosis not present

## 2020-11-22 DIAGNOSIS — I2510 Atherosclerotic heart disease of native coronary artery without angina pectoris: Secondary | ICD-10-CM | POA: Diagnosis present

## 2020-11-22 DIAGNOSIS — E1122 Type 2 diabetes mellitus with diabetic chronic kidney disease: Secondary | ICD-10-CM | POA: Diagnosis present

## 2020-11-22 DIAGNOSIS — E1165 Type 2 diabetes mellitus with hyperglycemia: Secondary | ICD-10-CM | POA: Diagnosis not present

## 2020-11-22 DIAGNOSIS — I130 Hypertensive heart and chronic kidney disease with heart failure and stage 1 through stage 4 chronic kidney disease, or unspecified chronic kidney disease: Secondary | ICD-10-CM | POA: Diagnosis not present

## 2020-11-22 DIAGNOSIS — Z9981 Dependence on supplemental oxygen: Secondary | ICD-10-CM

## 2020-11-22 DIAGNOSIS — R0600 Dyspnea, unspecified: Secondary | ICD-10-CM

## 2020-11-22 DIAGNOSIS — Z515 Encounter for palliative care: Secondary | ICD-10-CM

## 2020-11-22 DIAGNOSIS — R0602 Shortness of breath: Secondary | ICD-10-CM | POA: Diagnosis not present

## 2020-11-22 DIAGNOSIS — R059 Cough, unspecified: Secondary | ICD-10-CM | POA: Diagnosis not present

## 2020-11-22 DIAGNOSIS — Z7189 Other specified counseling: Secondary | ICD-10-CM | POA: Diagnosis not present

## 2020-11-22 DIAGNOSIS — Z79899 Other long term (current) drug therapy: Secondary | ICD-10-CM

## 2020-11-22 DIAGNOSIS — K7290 Hepatic failure, unspecified without coma: Secondary | ICD-10-CM | POA: Diagnosis not present

## 2020-11-22 DIAGNOSIS — I5082 Biventricular heart failure: Secondary | ICD-10-CM | POA: Diagnosis present

## 2020-11-22 DIAGNOSIS — T380X5A Adverse effect of glucocorticoids and synthetic analogues, initial encounter: Secondary | ICD-10-CM | POA: Diagnosis not present

## 2020-11-22 DIAGNOSIS — L89151 Pressure ulcer of sacral region, stage 1: Secondary | ICD-10-CM | POA: Diagnosis present

## 2020-11-22 DIAGNOSIS — I5023 Acute on chronic systolic (congestive) heart failure: Secondary | ICD-10-CM | POA: Diagnosis not present

## 2020-11-22 DIAGNOSIS — Z452 Encounter for adjustment and management of vascular access device: Secondary | ICD-10-CM | POA: Diagnosis not present

## 2020-11-22 DIAGNOSIS — I2781 Cor pulmonale (chronic): Secondary | ICD-10-CM | POA: Diagnosis present

## 2020-11-22 DIAGNOSIS — R180 Malignant ascites: Secondary | ICD-10-CM

## 2020-11-22 DIAGNOSIS — I493 Ventricular premature depolarization: Secondary | ICD-10-CM | POA: Diagnosis not present

## 2020-11-22 DIAGNOSIS — Z20822 Contact with and (suspected) exposure to covid-19: Secondary | ICD-10-CM | POA: Diagnosis present

## 2020-11-22 DIAGNOSIS — Z85038 Personal history of other malignant neoplasm of large intestine: Secondary | ICD-10-CM

## 2020-11-22 DIAGNOSIS — M6281 Muscle weakness (generalized): Secondary | ICD-10-CM | POA: Diagnosis not present

## 2020-11-22 DIAGNOSIS — Z95828 Presence of other vascular implants and grafts: Secondary | ICD-10-CM

## 2020-11-22 DIAGNOSIS — D6959 Other secondary thrombocytopenia: Secondary | ICD-10-CM | POA: Diagnosis present

## 2020-11-22 DIAGNOSIS — D61818 Other pancytopenia: Secondary | ICD-10-CM | POA: Diagnosis present

## 2020-11-22 DIAGNOSIS — D649 Anemia, unspecified: Secondary | ICD-10-CM | POA: Diagnosis not present

## 2020-11-22 DIAGNOSIS — M898X9 Other specified disorders of bone, unspecified site: Secondary | ICD-10-CM | POA: Diagnosis not present

## 2020-11-22 DIAGNOSIS — I872 Venous insufficiency (chronic) (peripheral): Secondary | ICD-10-CM | POA: Diagnosis present

## 2020-11-22 DIAGNOSIS — K219 Gastro-esophageal reflux disease without esophagitis: Secondary | ICD-10-CM | POA: Diagnosis not present

## 2020-11-22 DIAGNOSIS — K759 Inflammatory liver disease, unspecified: Secondary | ICD-10-CM | POA: Diagnosis not present

## 2020-11-22 DIAGNOSIS — D62 Acute posthemorrhagic anemia: Secondary | ICD-10-CM | POA: Diagnosis not present

## 2020-11-22 DIAGNOSIS — I50813 Acute on chronic right heart failure: Secondary | ICD-10-CM | POA: Diagnosis not present

## 2020-11-22 DIAGNOSIS — Z87891 Personal history of nicotine dependence: Secondary | ICD-10-CM

## 2020-11-22 DIAGNOSIS — R319 Hematuria, unspecified: Secondary | ICD-10-CM | POA: Diagnosis not present

## 2020-11-22 DIAGNOSIS — M85841 Other specified disorders of bone density and structure, right hand: Secondary | ICD-10-CM | POA: Diagnosis not present

## 2020-11-22 DIAGNOSIS — R2689 Other abnormalities of gait and mobility: Secondary | ICD-10-CM | POA: Diagnosis not present

## 2020-11-22 DIAGNOSIS — Z794 Long term (current) use of insulin: Secondary | ICD-10-CM

## 2020-11-22 DIAGNOSIS — R41841 Cognitive communication deficit: Secondary | ICD-10-CM | POA: Diagnosis not present

## 2020-11-22 DIAGNOSIS — R0902 Hypoxemia: Secondary | ICD-10-CM | POA: Diagnosis not present

## 2020-11-22 DIAGNOSIS — J9601 Acute respiratory failure with hypoxia: Secondary | ICD-10-CM | POA: Diagnosis not present

## 2020-11-22 DIAGNOSIS — N400 Enlarged prostate without lower urinary tract symptoms: Secondary | ICD-10-CM | POA: Diagnosis present

## 2020-11-22 DIAGNOSIS — I472 Ventricular tachycardia, unspecified: Secondary | ICD-10-CM | POA: Diagnosis not present

## 2020-11-22 DIAGNOSIS — D472 Monoclonal gammopathy: Secondary | ICD-10-CM | POA: Diagnosis present

## 2020-11-22 DIAGNOSIS — Z4901 Encounter for fitting and adjustment of extracorporeal dialysis catheter: Secondary | ICD-10-CM | POA: Diagnosis not present

## 2020-11-22 DIAGNOSIS — R609 Edema, unspecified: Secondary | ICD-10-CM | POA: Diagnosis not present

## 2020-11-22 DIAGNOSIS — I5031 Acute diastolic (congestive) heart failure: Secondary | ICD-10-CM | POA: Diagnosis not present

## 2020-11-22 LAB — PHOSPHORUS: Phosphorus: 4.6 mg/dL (ref 2.5–4.6)

## 2020-11-22 LAB — BASIC METABOLIC PANEL
Anion gap: 9 (ref 5–15)
BUN: 58 mg/dL — ABNORMAL HIGH (ref 8–23)
CO2: 30 mmol/L (ref 22–32)
Calcium: 8.6 mg/dL — ABNORMAL LOW (ref 8.9–10.3)
Chloride: 101 mmol/L (ref 98–111)
Creatinine, Ser: 2.08 mg/dL — ABNORMAL HIGH (ref 0.61–1.24)
GFR, Estimated: 34 mL/min — ABNORMAL LOW (ref >60)
Glucose, Bld: 134 mg/dL — ABNORMAL HIGH (ref 70–99)
Potassium: 4.2 mmol/L (ref 3.5–5.1)
Sodium: 140 mmol/L (ref 135–145)

## 2020-11-22 LAB — ECHOCARDIOGRAM COMPLETE
Height: 64 in
MV M vel: 5.11 m/s
MV Peak grad: 104.4 mmHg
Radius: 0.2 cm
Weight: 4591.99 [oz_av]

## 2020-11-22 LAB — CBC WITH DIFFERENTIAL/PLATELET
Abs Immature Granulocytes: 0.02 10*3/uL (ref 0.00–0.07)
Basophils Absolute: 0 10*3/uL (ref 0.0–0.1)
Basophils Relative: 1 %
Eosinophils Absolute: 0.1 10*3/uL (ref 0.0–0.5)
Eosinophils Relative: 3 %
HCT: 32.8 % — ABNORMAL LOW (ref 39.0–52.0)
Hemoglobin: 8.9 g/dL — ABNORMAL LOW (ref 13.0–17.0)
Immature Granulocytes: 0 %
Lymphocytes Relative: 10 %
Lymphs Abs: 0.5 10*3/uL — ABNORMAL LOW (ref 0.7–4.0)
MCH: 25.6 pg — ABNORMAL LOW (ref 26.0–34.0)
MCHC: 27.1 g/dL — ABNORMAL LOW (ref 30.0–36.0)
MCV: 94.3 fL (ref 80.0–100.0)
Monocytes Absolute: 0.6 10*3/uL (ref 0.1–1.0)
Monocytes Relative: 12 %
Neutro Abs: 3.6 10*3/uL (ref 1.7–7.7)
Neutrophils Relative %: 74 %
Platelets: 102 10*3/uL — ABNORMAL LOW (ref 150–400)
RBC: 3.48 MIL/uL — ABNORMAL LOW (ref 4.22–5.81)
RDW: 19.9 % — ABNORMAL HIGH (ref 11.5–15.5)
WBC: 4.8 10*3/uL (ref 4.0–10.5)
nRBC: 0 % (ref 0.0–0.2)

## 2020-11-22 LAB — GLUCOSE, CAPILLARY
Glucose-Capillary: 141 mg/dL — ABNORMAL HIGH (ref 70–99)
Glucose-Capillary: 115 mg/dL — ABNORMAL HIGH (ref 70–99)
Glucose-Capillary: 145 mg/dL — ABNORMAL HIGH (ref 70–99)

## 2020-11-22 LAB — HEPATIC FUNCTION PANEL
ALT: 16 U/L (ref 0–44)
AST: 19 U/L (ref 15–41)
Albumin: 3.4 g/dL — ABNORMAL LOW (ref 3.5–5.0)
Alkaline Phosphatase: 79 U/L (ref 38–126)
Bilirubin, Direct: 0.2 mg/dL (ref 0.0–0.2)
Indirect Bilirubin: 0.7 mg/dL (ref 0.3–0.9)
Total Bilirubin: 0.9 mg/dL (ref 0.3–1.2)
Total Protein: 6.8 g/dL (ref 6.5–8.1)

## 2020-11-22 LAB — MAGNESIUM: Magnesium: 2.5 mg/dL — ABNORMAL HIGH (ref 1.7–2.4)

## 2020-11-22 LAB — AMMONIA: Ammonia: 35 umol/L (ref 9–35)

## 2020-11-22 LAB — RESP PANEL BY RT-PCR (FLU A&B, COVID) ARPGX2
Influenza A by PCR: NEGATIVE
Influenza B by PCR: NEGATIVE
SARS Coronavirus 2 by RT PCR: NEGATIVE

## 2020-11-22 LAB — CBG MONITORING, ED: Glucose-Capillary: 105 mg/dL — ABNORMAL HIGH (ref 70–99)

## 2020-11-22 LAB — BRAIN NATRIURETIC PEPTIDE: B Natriuretic Peptide: 270 pg/mL — ABNORMAL HIGH (ref 0.0–100.0)

## 2020-11-22 LAB — PROTIME-INR
INR: 2.7 — ABNORMAL HIGH (ref 0.8–1.2)
Prothrombin Time: 28.6 seconds — ABNORMAL HIGH (ref 11.4–15.2)

## 2020-11-22 MED ORDER — INSULIN GLARGINE-YFGN 100 UNIT/ML ~~LOC~~ SOLN
5.0000 [IU] | Freq: Every day | SUBCUTANEOUS | Status: DC
  Filled 2020-11-22: qty 0.05

## 2020-11-22 MED ORDER — FUROSEMIDE 10 MG/ML IJ SOLN: 60.0000 mg | Freq: Two times a day (BID) | INTRAMUSCULAR | Status: DC

## 2020-11-22 MED ORDER — LIDOCAINE HCL URETHRAL/MUCOSAL 2 % EX GEL: 1.0000 "application " | Freq: Once | CUTANEOUS | Status: AC

## 2020-11-22 MED ORDER — SILDENAFIL CITRATE 20 MG PO TABS
20.0000 mg | ORAL_TABLET | Freq: Three times a day (TID) | ORAL | Status: DC
  Administered 2020-11-22 – 2020-12-11 (×55): 20 mg via ORAL
  Filled 2020-11-22 (×58): qty 1

## 2020-11-22 MED ORDER — FUROSEMIDE 10 MG/ML IJ SOLN
80.0000 mg | Freq: Once | INTRAMUSCULAR | Status: AC
  Administered 2020-11-22: 80 mg via INTRAVENOUS
  Filled 2020-11-22: qty 8

## 2020-11-22 MED ORDER — METOPROLOL SUCCINATE ER 25 MG PO TB24
25.0000 mg | ORAL_TABLET | Freq: Every day | ORAL | Status: DC
  Administered 2020-11-22 – 2020-11-27 (×5): 25 mg via ORAL
  Filled 2020-11-22 (×6): qty 1

## 2020-11-22 MED ORDER — MELATONIN 5 MG PO TABS: 5.0000 mg | ORAL_TABLET | Freq: Every evening | ORAL | Status: DC | PRN

## 2020-11-22 MED ORDER — ATORVASTATIN CALCIUM 10 MG PO TABS
20.0000 mg | ORAL_TABLET | Freq: Every day | ORAL | Status: DC
  Administered 2020-11-22 – 2020-12-11 (×19): 20 mg via ORAL
  Filled 2020-11-22: qty 1
  Filled 2020-11-22 (×17): qty 2
  Filled 2020-11-22: qty 1

## 2020-11-22 MED ORDER — WARFARIN SODIUM 7.5 MG PO TABS: 7.5000 mg | ORAL_TABLET | Freq: Every day | ORAL | Status: DC

## 2020-11-22 MED ORDER — ACETAMINOPHEN 325 MG PO TABS
650.0000 mg | ORAL_TABLET | Freq: Four times a day (QID) | ORAL | Status: DC | PRN
  Administered 2020-11-22 – 2020-12-11 (×11): 650 mg via ORAL
  Filled 2020-11-22 (×10): qty 2

## 2020-11-22 MED ORDER — POLYETHYLENE GLYCOL 3350 17 G PO PACK
17.0000 g | PACK | Freq: Every day | ORAL | Status: DC
  Administered 2020-11-23 – 2020-12-08 (×9): 17 g via ORAL
  Filled 2020-11-22 (×15): qty 1

## 2020-11-22 MED ORDER — INSULIN ASPART 100 UNIT/ML IJ SOLN
0.0000 [IU] | Freq: Every day | INTRAMUSCULAR | Status: DC
  Administered 2020-12-03: 22:00:00 2 [IU] via SUBCUTANEOUS
  Administered 2020-12-05: 3 [IU] via SUBCUTANEOUS
  Administered 2020-12-06: 22:00:00 2 [IU] via SUBCUTANEOUS

## 2020-11-22 MED ORDER — PANTOPRAZOLE SODIUM 40 MG PO TBEC
40.0000 mg | DELAYED_RELEASE_TABLET | Freq: Every day | ORAL | Status: DC
  Administered 2020-11-22 – 2020-12-11 (×19): 40 mg via ORAL
  Filled 2020-11-22 (×19): qty 1

## 2020-11-22 MED ORDER — CHLORHEXIDINE GLUCONATE CLOTH 2 % EX PADS
6.0000 | MEDICATED_PAD | Freq: Every day | CUTANEOUS | Status: DC
  Administered 2020-11-22 – 2020-12-05 (×13): 6 via TOPICAL

## 2020-11-22 MED ORDER — PERFLUTREN LIPID MICROSPHERE
1.0000 mL | INTRAVENOUS | Status: AC | PRN
Start: 2020-11-22 — End: 2020-11-22
  Administered 2020-11-22: 2 mL via INTRAVENOUS
  Filled 2020-11-22: qty 10

## 2020-11-22 MED ORDER — WARFARIN SODIUM 7.5 MG PO TABS: 7.5000 mg | ORAL_TABLET | Freq: Once | ORAL | Status: DC

## 2020-11-22 MED ORDER — FUROSEMIDE 10 MG/ML IJ SOLN
60.0000 mg | Freq: Two times a day (BID) | INTRAMUSCULAR | Status: DC
  Administered 2020-11-22 – 2020-11-23 (×3): 60 mg via INTRAVENOUS
  Filled 2020-11-22 (×4): qty 6

## 2020-11-22 MED ORDER — INSULIN GLARGINE-YFGN 100 UNIT/ML ~~LOC~~ SOLN
16.0000 [IU] | Freq: Every day | SUBCUTANEOUS | Status: DC
  Administered 2020-11-22 – 2020-11-30 (×8): 16 [IU] via SUBCUTANEOUS
  Filled 2020-11-22 (×12): qty 0.16

## 2020-11-22 MED ORDER — LIDOCAINE HCL URETHRAL/MUCOSAL 2 % EX GEL
CUTANEOUS | Status: AC
  Administered 2020-11-22: 1 via URETHRAL
  Filled 2020-11-22: qty 10

## 2020-11-22 MED ORDER — INSULIN ASPART 100 UNIT/ML IJ SOLN
0.0000 [IU] | Freq: Three times a day (TID) | INTRAMUSCULAR | Status: DC
  Administered 2020-11-22 – 2020-11-24 (×4): 1 [IU] via SUBCUTANEOUS
  Administered 2020-11-24 – 2020-11-25 (×3): 2 [IU] via SUBCUTANEOUS
  Administered 2020-11-25: 10:00:00 1 [IU] via SUBCUTANEOUS
  Administered 2020-11-26 (×2): 2 [IU] via SUBCUTANEOUS
  Administered 2020-11-26: 18:00:00 1 [IU] via SUBCUTANEOUS
  Administered 2020-11-27: 07:00:00 2 [IU] via SUBCUTANEOUS
  Administered 2020-11-28: 08:00:00 1 [IU] via SUBCUTANEOUS
  Administered 2020-11-28: 16:00:00 2 [IU] via SUBCUTANEOUS
  Administered 2020-11-29: 16:00:00 1 [IU] via SUBCUTANEOUS
  Administered 2020-11-29: 12:00:00 2 [IU] via SUBCUTANEOUS
  Administered 2020-11-30 (×3): 1 [IU] via SUBCUTANEOUS
  Administered 2020-12-02: 2 [IU] via SUBCUTANEOUS
  Administered 2020-12-02: 1 [IU] via SUBCUTANEOUS
  Administered 2020-12-02: 2 [IU] via SUBCUTANEOUS
  Administered 2020-12-03: 1 [IU] via SUBCUTANEOUS
  Administered 2020-12-03: 11:00:00 3 [IU] via SUBCUTANEOUS
  Administered 2020-12-03: 16:00:00 7 [IU] via SUBCUTANEOUS
  Administered 2020-12-04: 12:00:00 2 [IU] via SUBCUTANEOUS
  Administered 2020-12-04: 18:00:00 7 [IU] via SUBCUTANEOUS
  Administered 2020-12-05 (×2): 1 [IU] via SUBCUTANEOUS
  Administered 2020-12-05: 2 [IU] via SUBCUTANEOUS
  Administered 2020-12-06: 12:00:00 1 [IU] via SUBCUTANEOUS
  Administered 2020-12-06 – 2020-12-07 (×3): 2 [IU] via SUBCUTANEOUS
  Administered 2020-12-08: 1 [IU] via SUBCUTANEOUS
  Administered 2020-12-10 (×2): 2 [IU] via SUBCUTANEOUS
  Administered 2020-12-10 – 2020-12-11 (×2): 1 [IU] via SUBCUTANEOUS

## 2020-11-22 MED ORDER — OXYBUTYNIN CHLORIDE 5 MG PO TABS
5.0000 mg | ORAL_TABLET | Freq: Three times a day (TID) | ORAL | Status: DC
  Administered 2020-11-22 – 2020-11-23 (×3): 5 mg via ORAL
  Filled 2020-11-22 (×3): qty 1

## 2020-11-22 MED ORDER — MELATONIN 3 MG PO TABS
3.0000 mg | ORAL_TABLET | Freq: Every evening | ORAL | Status: DC | PRN
  Administered 2020-11-23 – 2020-12-07 (×9): 3 mg via ORAL
  Filled 2020-11-22 (×9): qty 1

## 2020-11-22 MED ORDER — WARFARIN - PHARMACIST DOSING INPATIENT: Freq: Every day | Status: DC

## 2020-11-22 MED ORDER — ALLOPURINOL 300 MG PO TABS
300.0000 mg | ORAL_TABLET | Freq: Every day | ORAL | Status: DC
  Administered 2020-11-22 – 2020-12-11 (×19): 300 mg via ORAL
  Filled 2020-11-22 (×2): qty 1
  Filled 2020-11-22: qty 3
  Filled 2020-11-22 (×10): qty 1
  Filled 2020-11-22: qty 3
  Filled 2020-11-22: qty 1
  Filled 2020-11-22: qty 3
  Filled 2020-11-22 (×3): qty 1

## 2020-11-22 NOTE — Progress Notes (Signed)
PT REQUESTED TYLENOL AS PRN FOR PAIN IN GROIN RATED AT 7 OF 10. PT SITTING UP RIGHT IN CHAIR ABLE TO MAKE NEEDS KNOWN. Will continue to monitor.

## 2020-11-22 NOTE — ED Triage Notes (Signed)
Pt brought in by EMS for swollen testicles x 2 days. Pt saw heart Dr and he increased dose of fluid pill and added another medication.

## 2020-11-22 NOTE — ED Provider Notes (Signed)
[facility] EMERGENCY DEPARTMENT Provider Note   CSN: [id number] Arrival date & time: 11/22/20  0303     History Chief Complaint  Patient presents with   Groin Swelling    William Flores is a 70 y.o. male.  Patient presents to the emergency department for evaluation of diffuse swelling.  Patient has a history of chronic kidney disease, CHF and liver cirrhosis.  Patient reports that he saw his cardiologist 2 days for the swelling and had his Lasix increased but swelling has persisted.  He has now swollen up through the abdomen.  He reports significant testicular swelling.  He has not experiencing any testicular pain or abdominal pain.  No fever, nausea, vomiting or diarrhea.  He does not feel significantly short of breath compared to his baseline.       Past Medical History:  Diagnosis Date   CAD (coronary artery disease)    LHC 2/08:  mRCA 50%, o/w no sig CAD, EF 25%   Cardiomyopathy ([place])    EF 35-40% in 2/16 in setting of AF with RVR >> echo 5/16 with improved LVF with EF 65-70%   Chronic atrial fibrillation (HCC)    Chronic diastolic heart failure (HCC)    HFPF - EF ~65-70% (OFTEN EXACERBATED BY AFIB)   CKD (chronic kidney disease)    hx of worsening renal failure and hyperK+ in setting of acute diast CHF >> required CVVHD in 4/16 and dialysis x 1 in 5/16   CKD stage 3 due to type 2 diabetes mellitus ([place]) 09/17/2015   Colon cancer ([place])    Colon polyps 09/21/2012   Tubular adenoma   Diabetes ([place])    Hyperlipidemia    Hypertension    Obesity hypoventilation syndrome ([place])    Renal insufficiency    Sleep apnea     Patient Active Problem List   Diagnosis Date Noted   Ascites 11/12/2020   Acute blood loss anemia    Acquired thrombophilia ([place]) 10/14/2020   Liver cirrhosis secondary to NASH ([place]) 10/14/2020   Abdominal distension    Iron deficiency anemia 07/21/2020   AKI (acute kidney injury) ([place])    Syncope and collapse    Syncope 06/19/2020   Hypokalemia  06/19/2020   Fecal occult blood test positive 06/19/2020   Cor pulmonale, acute ([place]) 04/15/2019   Bilateral leg edema    Chronic renal failure    Acute on chronic congestive heart failure ([place]) 03/29/2019   Pulmonary hypertension ([place]) 12/28/2018   Hyperlipidemia associated with type 2 diabetes mellitus ([place]) 05/18/2017   Morbid obesity with BMI of 45.0-49.9, adult ([place]) 05/18/2017   CKD stage 3 due to type 2 diabetes mellitus ([place]) 09/17/2015   MGUS (monoclonal gammopathy of unknown significance) 05/27/2015   Normocytic anemia 11/04/2014   Peripheral edema    Essential hypertension    DM (diabetes mellitus), type 2 with renal complications ([place]) 56/81/2751   Atrial fibrillation with slow ventricular response ([place]) 04/14/2014   Anticoagulated on Coumadin 03/20/2014   Nonischemic cardiomyopathy ([place]) 02/20/2014   OSA (obstructive sleep apnea)    Right heart failure ([place]) 02/10/2014   Morbid obesity ([place]) 08/05/2013   History of colon cancer    Gout 05/25/2012   Hypertensive heart disease     Past Surgical History:  Procedure Laterality Date   CIRCUMCISION     COLON RESECTION     COLONOSCOPY N/A 09/21/2012   Procedure: COLONOSCOPY;  Surgeon: Inda Castle, MD;  Location: [facility] ENDOSCOPY;  Service: Endoscopy;  Laterality:  N/A;   COLONOSCOPY N/A 12/28/2018   Procedure: COLONOSCOPY;  Surgeon: Rogene Houston, MD;  Location: [facility] ENDO SUITE;  Service: Endoscopy;  Laterality: N/A;   COLONOSCOPY WITH PROPOFOL N/A 10/17/2020   Procedure: COLONOSCOPY WITH PROPOFOL;  Surgeon: Harvel Quale, MD;  Location: [facility] ENDO SUITE;  Service: Gastroenterology;  Laterality: N/A;   ENTEROSCOPY N/A 06/24/2020   Procedure: ENTEROSCOPY;  Surgeon: Gatha Mayer, MD;  Location: [facility] ENDOSCOPY;  Service: Endoscopy;  Laterality: N/A;   ESOPHAGOGASTRODUODENOSCOPY N/A 12/28/2018   Procedure: ESOPHAGOGASTRODUODENOSCOPY (EGD);  Surgeon: Rogene Houston, MD;  Location: [facility] ENDO SUITE;  Service: Endoscopy;   Laterality: N/A;   POLYPECTOMY  10/17/2020   Procedure: POLYPECTOMY;  Surgeon: Harvel Quale, MD;  Location: [facility] ENDO SUITE;  Service: Gastroenterology;;  rectal, transverse, descending, ascending, sigmoid   RIGHT HEART CATH N/A 04/11/2019   Procedure: RIGHT HEART CATH;  Surgeon: Larey Dresser, MD;  Location: [facility];  Service: Cardiovascular;  Laterality: N/A;   US ECHOCARDIOGRAPHY  03/04/2010   abnormal study       Family History  Problem Relation Age of Onset   Breast cancer Mother    Diabetes Mother    Heart attack Father     Social History   Tobacco Use   Smoking status: Former    Types: Cigarettes    Quit date: 08/06/1983    Years since quitting: 37.3   Smokeless tobacco: Never  Vaping Use   Vaping Use: Never used  Substance Use Topics   Alcohol use: No   Drug use: No    Home Medications Prior to Admission medications   Medication Sig Start Date End Date Taking? Authorizing Provider  acetaminophen (TYLENOL) 325 MG tablet Take 2 tablets (650 mg total) by mouth every 6 (six) hours as needed for mild pain (or Fever >/= 101). 01/01/19   Roxan Hockey, MD  allopurinol (ZYLOPRIM) 300 MG tablet Take 1 tablet (300 mg total) by mouth daily. 12/20/19   [name], [name], MD  atorvastatin (LIPITOR) 20 MG tablet TAKE 1 TABLET BY MOUTH EVERY DAY 03/19/20   [name], [name], MD  benzonatate (TESSALON) 100 MG capsule Take 1 capsule (100 mg total) by mouth 2 (two) times daily as needed for cough. 06/25/20   Patrecia Pour, MD  Dulaglutide (TRULICITY) 1.5 IR/4.4RX SOPN Inject 1.5 mg into the skin once a week. 12/20/19   [name], [name], MD  ertugliflozin L-PyroglutamicAc (STEGLATRO) 5 MG TABS tablet Take 5 mg by mouth daily.    [provider]  ferrous sulfate 325 (65 FE) MG tablet Take 325 mg by mouth daily with breakfast.     [provider]  glucose blood (CONTOUR NEXT TEST) test strip Test BS TID Dx E11.29 05/22/20   [name], [name], MD  Insulin Glargine (LANTUS SOLOSTAR [place]) Inject into the skin. 16 units    [provider]  insulin glargine-yfgn (SEMGLEE) 100 UNIT/ML Pen Inject 16 Units into the skin at bedtime.    [provider]  Insulin Pen Needle 32G X 4 MM MISC Use to give insulin daily Dx E11.29 04/15/19   Charlynne Cousins, MD  melatonin 5 MG TABS Take 5 mg by mouth at bedtime as needed.    [provider]  metoprolol succinate (TOPROL-XL) 25 MG 24 hr tablet Take 1 tablet (25 mg total) by mouth daily. 10/25/20   Orson Eva, MD  OXYGEN Inhale into the lungs. Continuous    [provider]  pantoprazole (PROTONIX) 40 MG tablet TAKE 1 TABLET BY MOUTH EVERY DAY 06/02/20   [name], [name], MD  polyethylene glycol (MIRALAX / GLYCOLAX) 17 g packet Take 17 g by mouth daily. 01/01/19   Roxan Hockey, MD  sildenafil (REVATIO) 20 MG tablet Take 1 tablet (20 mg total) by mouth 3 (three) times daily. 08/24/20   Larey Dresser, MD  torsemide (DEMADEX) 20 MG tablet Take 3 tablets (60 mg total) by mouth 2 (two) times daily. 09/29/20   Larey Dresser, MD  warfarin (COUMADIN) 7.5 MG tablet Take 7.5 mg by mouth daily.    [provider]  diltiazem [facility]) 240 MG 24 hr capsule TAKE 1 CAPSULE DAILY 10/19/12 02/17/13  Vernie Shanks, MD    Allergies    Codeine  Review of Systems   Review of Systems  Cardiovascular:  Positive for leg swelling.  Gastrointestinal:  Positive for abdominal distention.  Genitourinary:  Positive for scrotal swelling.  All other systems reviewed and are negative.  Physical Exam Updated Vital Signs BP 129/67   Pulse 96   Temp 98.3 F (36.8 C) (Oral)   Resp 20   Ht 5' 4"  (1.626 m)   Wt 130.2 kg   SpO2 95%   BMI 49.26 kg/m   Physical Exam Vitals and nursing note reviewed.  Constitutional:      General: He is not in acute distress.    Appearance: Normal appearance. He is well-developed.  HENT:     Head: Normocephalic and atraumatic.      Right Ear: Hearing normal.     Left Ear: Hearing normal.     Nose: Nose normal.  Eyes:     Conjunctiva/sclera: Conjunctivae normal.     Pupils: Pupils are equal, round, and reactive to light.  Cardiovascular:     Rate and Rhythm: Regular rhythm.     Heart sounds: S1 normal and S2 normal. No murmur heard.   No friction rub. No gallop.  Pulmonary:     Effort: Pulmonary effort is normal. No respiratory distress.     Breath sounds: Normal breath sounds.  Chest:     Chest wall: No tenderness.  Abdominal:     General: Bowel sounds are normal. There is distension.     Palpations: Abdomen is soft.     Tenderness: There is no abdominal tenderness. There is no guarding or rebound. Negative signs include Murphy's sign and McBurney's sign.     Hernia: No hernia is present.  Genitourinary:    Comments: Bilateral scrotal edema Musculoskeletal:        General: Normal range of motion.     Cervical back: Normal range of motion and neck supple.     Right lower leg: Edema present.     Left lower leg: Edema present.  Skin:    General: Skin is warm and dry.     Findings: No rash.  Neurological:     Mental Status: He is alert and oriented to person, place, and time.     GCS: GCS eye subscore is 4. GCS verbal subscore is 5. GCS motor subscore is 6.     Cranial Nerves: No cranial nerve deficit.     Sensory: No sensory deficit.     Coordination: Coordination normal.  Psychiatric:        Speech: Speech normal.        Behavior: Behavior normal.        Thought Content: Thought content normal.    ED Results /  Procedures / Treatments   Labs (all labs ordered are listed, but only abnormal results are displayed) Labs Reviewed  CBC WITH DIFFERENTIAL/PLATELET - Abnormal; Notable for the following components:      Result Value   RBC 3.48 (*)    Hemoglobin 8.9 (*)    HCT 32.8 (*)    MCH 25.6 (*)    MCHC 27.1 (*)    RDW 19.9 (*)    Platelets 102 (*)    Lymphs Abs 0.5 (*)    All other components  within normal limits  BASIC METABOLIC PANEL - Abnormal; Notable for the following components:   Glucose, Bld 134 (*)    BUN 58 (*)    Creatinine, Ser 2.08 (*)    Calcium 8.6 (*)    GFR, Estimated 34 (*)    All other components within normal limits  HEPATIC FUNCTION PANEL - Abnormal; Notable for the following components:   Albumin 3.4 (*)    All other components within normal limits  BRAIN NATRIURETIC PEPTIDE - Abnormal; Notable for the following components:   B Natriuretic Peptide 270.0 (*)    All other components within normal limits  PROTIME-INR - Abnormal; Notable for the following components:   Prothrombin Time 28.6 (*)    INR 2.7 (*)    All other components within normal limits  RESP PANEL BY RT-PCR (FLU A&B, COVID) [id number]  AMMONIA    EKG EKG Interpretation  Date/Time:  Sunday November 22 2020 03:17:06 EST Ventricular Rate:  102 PR Interval:    QRS Duration: 100 QT Interval:  378 QTC Calculation: 493 R Axis:   57 Text Interpretation: Atrial fibrillation Ventricular premature complex Low voltage, precordial leads Probable anteroseptal infarct, old Nonspecific T abnormalities, lateral leads Confirmed by Orpah Greek [phone]) on 11/22/2020 4:45:04 AM  Radiology DG Chest Port 1 View  Result Date: 11/22/2020 CLINICAL DATA:  swelling and shortness of breath. EXAM: PORTABLE CHEST 1 VIEW COMPARISON:  Portable chest 10/15/2020. FINDINGS: The heart is enlarged. Mild perihilar vascular congestion continues to be seen with mild lung base interstitial edema and small pleural effusions. No focal pneumonia is seen. Similar findings were noted previously. Osteopenia. IMPRESSION: Cardiomegaly with mild perihilar vascular congestion and basilar interstitial edema with small pleural effusions. The overall aeration seems unchanged. Electronically Signed   By: Telford Nab M.D.   On: 11/22/2020 04:01    Procedures Procedures   Medications Ordered in ED Medications  furosemide  (LASIX) injection 80 mg (has no administration in time range)    ED Course  I have reviewed the triage vital signs and the nursing notes.  Pertinent labs & imaging results that were available during my care of the patient were reviewed by me and considered in my medical decision making (see chart for details).    MDM Rules/Calculators/A&P                           Patient presents to the emergency department for evaluation of diffuse swelling.  Patient presents with anasarca.  Etiology is likely multifactorial.  Patient does have a history of congestive heart failure.  Additionally he has a history of cirrhosis.  He is experiencing lower extremity edema, scrotal edema and abdominal distention and edema.  He has not tender, no concern for spontaneous bacterial peritonitis.  Unclear if he has significant ascites at this time, has not required paracentesis in the past.  His chest x-ray does show evidence of volume overload.  His protein and albumin, however, are not significantly diminished.  This might be simply congestive heart failure exacerbation.  He did see his cardiologist 2 days ago and had his Lasix increased but it has not helped.  We will give IV Lasix and admit for further management.  Final Clinical Impression(s) / ED Diagnoses Final diagnoses:  Acute on chronic congestive heart failure, unspecified heart failure type [facility])    Rx / DC Orders ED Discharge Orders     None        Lu Paradise, Gwenyth Allegra, MD 11/22/20 [phone]

## 2020-11-22 NOTE — Addendum Note (Signed)
Encounter addended by: Larey Dresser, MD on: 11/22/2020 9:51 PM  Actions taken: Clinical Note Signed

## 2020-11-22 NOTE — ED Notes (Signed)
Dr Ulice Bold.

## 2020-11-22 NOTE — ED Notes (Signed)
Dr Josephine Cables at bedside.

## 2020-11-22 NOTE — Progress Notes (Signed)
PROGRESS NOTE    William Flores  HER:[id number] DOB: 01/15/1950 DOA: 11/22/2020 PCP: [name], [name], MD    Brief Narrative:  70 year old male with a history of atrial fibrillation on anticoagulation, chronic kidney disease stage IIIb, chronic right-sided heart failure, pulmonary hypertension, cirrhosis, who is currently at [facility].  He was admitted to the hospital with increasing lower extremity edema and scrotal swelling.  He was admitted for IV diuretics.   Assessment & Plan:   Principal Problem:   Acute exacerbation of CHF (congestive heart failure) (HCC) Active Problems:   Right heart failure (HCC)   OSA (obstructive sleep apnea)   Anticoagulated on Coumadin   Atrial fibrillation with RVR (HCC)   DM (diabetes mellitus), type 2 with renal complications (HCC)   Chronic respiratory failure with hypoxia (HCC)   Essential hypertension   CKD (chronic kidney disease), stage III (HCC)   Mixed hyperlipidemia   Morbid obesity with BMI of 45.0-49.9, adult ([place])   Pulmonary hypertension (HCC)   Liver cirrhosis secondary to NASH (HCC)   Elevated brain natriuretic peptide (BNP) level   Thrombocytopenia (HCC)   GERD (gastroesophageal reflux disease)   Insomnia   Acute on chronic right-sided heart failure -Reports that he has been compliant with diuretics, his diuretic dose was increased approximately 2 days prior to admission -Currently started on intravenous Lasix -Appears to be having good urine output -Continue to monitor intake and output, daily weights  Chronic respiratory failure with hypoxia -Chronically on 2 L of oxygen  Cirrhosis secondary to NASH -Abdominal ultrasound did not show ascites -Appears to be having good urine output with IV Lasix -We will continue with diuretics for now -Can consider paracentesis if he stops responding to diuresis  Pulmonary hypertension -Continues on sildenafil -Encouraged to use CPAP for sleep  apnea  Thrombocytopenia -Related to liver disease -Continue to follow  Diabetes -Continued on basal insulin -Follow blood sugars with sliding scale  Chronic atrial fibrillation -Heart rate is currently stable -Continue on outpatient dose of Toprol -He is anticoagulated with Coumadin -Hold further Coumadin for now in light of hematuria  Possible urinary retention -Patient had Foley catheter placed while in the emergency room -Unclear whether he has true urinary retention since I do not see this documented anywhere -Patient reports that he normally is able to pass small amounts of urine -It is unclear whether his significant scrotal/penile edema was impeding his ability to pass his urine -Foley catheter was placed, but now patient appears to be having some hematuria -At this point, I am reluctant to remove Foley catheter due to concerns that he may develop significant urinary retention due to clots -If urine clears overnight, will try to discontinue Foley catheter in a.m.  Chronic kidney disease stage IIIb -Creatinine is currently at baseline -Continue to monitor with diuresis  Obstructive sleep apnea -Continue on CPAP  Morbid obesity -BMI 49.2 -Increase mobility/mortality  GERD -Continue Protonix     DVT prophylaxis: SCDs Start: 11/22/20 8563  Code Status: Full code Family Communication: Discussed with patient Disposition Plan: Status is: Inpatient  Remains inpatient appropriate because: Needs continued IV diuresis for massive volume overload         Consultants:    Procedures:  Echocardiogram: Preserved LVEF, significant RV dysfunction, unchanged from prior echo  Antimicrobials:      Subjective: Still has some shortness of breath.  Still has some significant lower extremity edema.  Objective: Vitals:   11/22/20 0930 11/22/20 1200 11/22/20 1220 11/22/20 1243  BP: 136/88 125/83  (!) 112/54  Pulse: 99 (!) 107  (!) 105  Resp: 20 (!) 25  20   Temp:   97.8 F (36.6 C) 98.8 F (37.1 C)  TempSrc:   Oral Oral  SpO2: 98% 96%  (!) 89%  Weight:      Height:        Intake/Output Summary (Last 24 hours) at 11/22/2020 1639 Last data filed at 11/22/2020 1307 Gross per 24 hour  Intake 240 ml  Output 3950 ml  Net -3710 ml   Filed Weights   11/22/20 0317  Weight: 130.2 kg    Examination:  General exam: Appears calm and comfortable  Respiratory system: Crackles at bases. Respiratory effort normal. Cardiovascular system: S1 & S2 heard, RRR. No JVD, murmurs, rubs, gallops or clicks.  Gastrointestinal system: Abdomen is nondistended, soft and nontender. No organomegaly or masses felt. Normal bowel sounds heard. Central nervous system: Alert and oriented. No focal neurological deficits. Extremities: 2+ pitting edema with significant scrotal edema Skin: No rashes, lesions or ulcers Psychiatry: Judgement and insight appear normal. Mood & affect appropriate.     Data Reviewed: I have personally reviewed following labs and imaging studies  CBC: Recent Labs  Lab 11/20/20 1128 11/22/20 0424  WBC 4.4 4.8  NEUTROABS  --  3.6  HGB 9.1* 8.9*  HCT 33.9* 32.8*  MCV 93.4 94.3  PLT 93* 030*   Basic Metabolic Panel: Recent Labs  Lab 11/20/20 1128 11/22/20 0424  NA 139 140  K 3.9 4.2  CL 103 101  CO2 31 30  GLUCOSE 108* 134*  BUN 52* 58*  CREATININE 2.09* 2.08*  CALCIUM 8.5* 8.6*  MG  --  2.5*  PHOS  --  4.6   GFR: Estimated Creatinine Clearance: 42.1 mL/min (A) (by C-G formula based on SCr of 2.08 mg/dL (H)). Liver Function Tests: Recent Labs  Lab 11/22/20 0424  AST 19  ALT 16  ALKPHOS 79  BILITOT 0.9  PROT 6.8  ALBUMIN 3.4*   No results for input(s): LIPASE, AMYLASE in the last 168 hours. Recent Labs  Lab 11/22/20 0425  AMMONIA 35   Coagulation Profile: Recent Labs  Lab 11/22/20 0424  INR 2.7*   Cardiac Enzymes: No results for input(s): CKTOTAL, CKMB, CKMBINDEX, TROPONINI in the last 168  hours. BNP (last 3 results) No results for input(s): PROBNP in the last 8760 hours. HbA1C: No results for input(s): HGBA1C in the last 72 hours. CBG: Recent Labs  Lab 11/22/20 0842 11/22/20 1255  GLUCAP 105* 141*   Lipid Profile: No results for input(s): CHOL, HDL, LDLCALC, TRIG, CHOLHDL, LDLDIRECT in the last 72 hours. Thyroid Function Tests: No results for input(s): TSH, T4TOTAL, FREET4, T3FREE, THYROIDAB in the last 72 hours. Anemia Panel: No results for input(s): VITAMINB12, FOLATE, FERRITIN, TIBC, IRON, RETICCTPCT in the last 72 hours. Sepsis Labs: No results for input(s): PROCALCITON, LATICACIDVEN in the last 168 hours.  Recent Results (from the past 240 hour(s))  Resp Panel by RT-PCR (Flu A&B, Covid) Nasopharyngeal Swab     Status: None   Collection Time: 11/22/20  5:12 AM   Specimen: Nasopharyngeal Swab; Nasopharyngeal(NP) swabs in vial transport medium  Result Value Ref Range Status   SARS Coronavirus 2 by RT PCR NEGATIVE NEGATIVE Final    Comment: (NOTE) SARS-CoV-2 target nucleic acids are NOT DETECTED.  The SARS-CoV-2 RNA is generally detectable in upper respiratory specimens during the acute phase of infection. The lowest concentration of SARS-CoV-2 viral copies this assay can  detect is 138 copies/mL. A negative result does not preclude SARS-Cov-2 infection and should not be used as the sole basis for treatment or other patient management decisions. A negative result may occur with  improper specimen collection/handling, submission of specimen other than nasopharyngeal swab, presence of viral mutation(s) within the areas targeted by this assay, and inadequate number of viral copies(<138 copies/mL). A negative result must be combined with clinical observations, patient history, and epidemiological information. The expected result is Negative.  Fact Sheet for Patients:  EntrepreneurPulse.com.au  Fact Sheet for Healthcare Providers:   IncredibleEmployment.be  This test is no t yet approved or cleared by the [place] FDA and  has been authorized for detection and/or diagnosis of SARS-CoV-2 by FDA under an Emergency Use Authorization (EUA). This EUA will remain  in effect (meaning this test can be used) for the duration of the COVID-19 declaration under Section 564(b)(1) of the Act, 21 U.S.C.section 360bbb-3(b)(1), unless the authorization is terminated  or revoked sooner.       Influenza A by PCR NEGATIVE NEGATIVE Final   Influenza B by PCR NEGATIVE NEGATIVE Final    Comment: (NOTE) The Xpert Xpress SARS-CoV-2/FLU/RSV plus assay is intended as an aid in the diagnosis of influenza from Nasopharyngeal swab specimens and should not be used as a sole basis for treatment. Nasal washings and aspirates are unacceptable for Xpert Xpress SARS-CoV-2/FLU/RSV testing.  Fact Sheet for Patients: EntrepreneurPulse.com.au  Fact Sheet for Healthcare Providers: IncredibleEmployment.be  This test is not yet approved or cleared by the [place] FDA and has been authorized for detection and/or diagnosis of SARS-CoV-2 by FDA under an Emergency Use Authorization (EUA). This EUA will remain in effect (meaning this test can be used) for the duration of the COVID-19 declaration under Section 564(b)(1) of the Act, 21 U.S.C. section 360bbb-3(b)(1), unless the authorization is terminated or revoked.  Performed at [facility], [address], [place], [place] [address]          Radiology Studies: [facility] Chest [facility] 1 View  Result Date: 11/22/2020 CLINICAL DATA:  swelling and shortness of breath. EXAM: PORTABLE CHEST 1 VIEW COMPARISON:  Portable chest 10/15/2020. FINDINGS: The heart is enlarged. Mild perihilar vascular congestion continues to be seen with mild lung base interstitial edema and small pleural effusions. No focal pneumonia is seen. Similar findings were noted  previously. Osteopenia. IMPRESSION: Cardiomegaly with mild perihilar vascular congestion and basilar interstitial edema with small pleural effusions. The overall aeration seems unchanged. Electronically Signed   By: Telford Nab M.D.   On: 11/22/2020 04:01   ECHOCARDIOGRAM COMPLETE  Result Date: 11/22/2020    ECHOCARDIOGRAM REPORT   Patient Name:   TRIGO WINTERBOTTOM Date of Exam: 11/22/2020 Medical Rec #:  [id number]      Height:       64.0 in Accession #:    [id number]     Weight:       287.0 lb Date of Birth:  May 28, 1950      BSA:          2.281 m Patient Age:    7 years       BP:           136/88 mmHg Patient Gender: M              HR:           61 bpm. Exam Location:  [facility] Procedure: 2D Echo, Cardiac Doppler, Color Doppler and Intracardiac  Opacification Agent Indications:    I50.40* Unspecified combined systolic (congestive) and diastolic                 (congestive) heart failure  History:        Patient has prior history of Echocardiogram examinations, most                 recent 10/06/2020. CHF and Cardiomyopathy, Abnormal ECG,                 Pulmonary HTN, Arrythmias:Atrial Fibrillation; Risk                 Factors:Diabetes, Hypertension and Dyslipidemia. RV failure.  Sonographer:    Roseanna Rainbow RDCS Referring Phys: [id number] OLADAPO ADEFESO  Sonographer Comments: Technically difficult study due to poor echo windows and patient is morbidly obese. Patient moving, grunting and coughing throughout exam. Patient having back pain. Study interrupted by consult. IMPRESSIONS  1. Left ventricular ejection fraction, by estimation, is 55 to 60%. The left ventricle has normal function. The left ventricle has no regional wall motion abnormalities. The left ventricular internal cavity size was moderately dilated. There is mild left ventricular hypertrophy. Left ventricular diastolic parameters are indeterminate. There is the interventricular septum is flattened in systole and diastole, consistent with  right ventricular pressure and volume overload.  2. Right ventricular systolic function is severely reduced. The right ventricular size is severely enlarged. There is severely elevated pulmonary artery systolic pressure.  3. Left atrial size was moderately dilated.  4. Right atrial size was severely dilated.  5. The mitral valve is abnormal. Mild to moderate mitral valve regurgitation. No evidence of mitral stenosis. Moderate mitral annular calcification.  6. Tricuspid valve regurgitation is moderate.  7. The aortic valve is calcified. Aortic valve regurgitation is not visualized. No aortic stenosis is present.  8. The inferior vena cava is dilated in size with <50% respiratory variability, suggesting right atrial pressure of 15 mmHg. FINDINGS  Left Ventricle: Left ventricular ejection fraction, by estimation, is 55 to 60%. The left ventricle has normal function. The left ventricle has no regional wall motion abnormalities. Definity contrast agent was given IV to delineate the left ventricular  endocardial borders. The left ventricular internal cavity size was moderately dilated. There is mild left ventricular hypertrophy. The interventricular septum is flattened in systole and diastole, consistent with right ventricular pressure and volume overload. Left ventricular diastolic parameters are indeterminate. Right Ventricle: The right ventricular size is severely enlarged. Right ventricular systolic function is severely reduced. There is severely elevated pulmonary artery systolic pressure. The tricuspid regurgitant velocity is 4.02 m/s, and with an assumed right atrial pressure of 15 mmHg, the estimated right ventricular systolic pressure is 50.5 mmHg. Left Atrium: Left atrial size was moderately dilated. Right Atrium: Right atrial size was severely dilated. Pericardium: Trivial pericardial effusion is present. Mitral Valve: The mitral valve is abnormal. Moderate mitral annular calcification. Mild to moderate mitral  valve regurgitation. No evidence of mitral valve stenosis. Tricuspid Valve: The tricuspid valve is normal in structure. Tricuspid valve regurgitation is moderate . No evidence of tricuspid stenosis. Aortic Valve: The aortic valve is calcified. Aortic valve regurgitation is not visualized. No aortic stenosis is present. Pulmonic Valve: The pulmonic valve was normal in structure. Pulmonic valve regurgitation is not visualized. No evidence of pulmonic stenosis. Aorta: The aortic root is normal in size and structure. Venous: The inferior vena cava is dilated in size with less than 50% respiratory variability, suggesting right atrial pressure  of 15 mmHg. IAS/Shunts: No atrial level shunt detected by color flow Doppler.  MR Peak grad:   104.4 mmHg   TRICUSPID VALVE MR Mean grad:   70.0 mmHg    TR Peak grad:   64.6 mmHg MR Vmax:        511.00 cm/s  TR Vmax:        402.00 cm/s MR Vmean:       394.0 cm/s MR PISA:        0.25 cm MR PISA Radius: 0.20 cm Kirk Ruths MD Electronically signed by Kirk Ruths MD Signature Date/Time: 11/22/2020/11:35:35 AM    Final    [place] ASCITES (ABDOMEN LIMITED)  Result Date: 11/22/2020 CLINICAL DATA:  Surgeon for ascites. EXAM: LIMITED ABDOMEN ULTRASOUND FOR ASCITES TECHNIQUE: Limited ultrasound survey for ascites was performed in all four abdominal quadrants. COMPARISON:  None. FINDINGS: There is mild free fluid in the right upper quadrant. There is moderate free fluid in the right lower quadrant, left lower quadrant and left upper quadrant. IMPRESSION: Mild to moderate 4 quadrant ascites. Electronically Signed   By: Audie Pinto M.D.   On: 11/22/2020 10:12        Scheduled Meds:  allopurinol  300 mg Oral Daily   atorvastatin  20 mg Oral Daily   Chlorhexidine Gluconate Cloth  6 each Topical Daily   furosemide  60 mg Intravenous BID   insulin aspart  0-5 Units Subcutaneous QHS   insulin aspart  0-9 Units Subcutaneous TID WC   insulin glargine-yfgn  5 Units  Subcutaneous QHS   insulin glargine-yfgn  16 Units Subcutaneous QHS   metoprolol succinate  25 mg Oral Daily   oxybutynin  5 mg Oral TID   pantoprazole  40 mg Oral Daily   polyethylene glycol  17 g Oral Daily   sildenafil  20 mg Oral TID   Warfarin - Pharmacist Dosing Inpatient   Does not apply q1600   Continuous Infusions:   LOS: 0 days    Time spent: 72mns    JKathie Dike MD [facility]   If 7PM-7AM, please contact night-coverage [email or web address]  11/22/2020, 4:39 PM

## 2020-11-22 NOTE — ED Notes (Signed)
Pt helped to bedside commode and helped back into bed.

## 2020-11-22 NOTE — Progress Notes (Addendum)
ANTICOAGULATION CONSULT NOTE - Initial Consult  Pharmacy Consult for warfarin Indication: atrial fibrillation  Allergies  Allergen Reactions   Codeine     Headache     Patient Measurements: Height: 5' 4"  (162.6 cm) Weight: 130.2 kg (287 lb) IBW/kg (Calculated) : 59.2 Heparin Dosing Weight:   Vital Signs: Temp: 98.3 F (36.8 C) (11/20 0317) Temp Source: Oral (11/20 0317) BP: 95/81 (11/20 0630) Pulse Rate: 119 (11/20 0630)  Labs: Recent Labs    11/20/20 1128 11/22/20 0424  HGB 9.1* 8.9*  HCT 33.9* 32.8*  PLT 93* 102*  LABPROT  --  28.6*  INR  --  2.7*  CREATININE 2.09* 2.08*    Estimated Creatinine Clearance: 42.1 mL/min (A) (by C-G formula based on SCr of 2.08 mg/dL (H)).   Medical History: Past Medical History:  Diagnosis Date   CAD (coronary artery disease)    LHC 2/08:  mRCA 50%, o/w no sig CAD, EF 25%   Cardiomyopathy ([place])    EF 35-40% in 2/16 in setting of AF with RVR >> echo 5/16 with improved LVF with EF 65-70%   Chronic atrial fibrillation (HCC)    Chronic diastolic heart failure (HCC)    HFPF - EF ~65-70% (OFTEN EXACERBATED BY AFIB)   CKD (chronic kidney disease)    hx of worsening renal failure and hyperK+ in setting of acute diast CHF >> required CVVHD in 4/16 and dialysis x 1 in 5/16   CKD stage 3 due to type 2 diabetes mellitus ([place]) 09/17/2015   Colon cancer ([place])    Colon polyps 09/21/2012   Tubular adenoma   Diabetes ([place])    Hyperlipidemia    Hypertension    Obesity hypoventilation syndrome (HCC)    Renal insufficiency    Sleep apnea     Medications:  (Not in a hospital admission)   Assessment: Pharmacy consulted to dose warfarin in patient with atrial fibrillation.  INR on admission is 2.7 with last dose received unknown currently pending med rec.  Home dose listed as 7.5 mg daily per med rec.   Patient recently admitted here in October for GI bleed and per GI recommended INR 2-2.5  Hgb 8.9  platelets 102  Goal of Therapy:   INR 2.0-2.5 Monitor platelets by anticoagulation protocol: Yes   Plan:  Hold warfarin x 1 dose. Monitor daily INR and s/s of bleeding.  Margot Ables, PharmD Clinical Pharmacist 11/22/2020 9:41 AM

## 2020-11-22 NOTE — Progress Notes (Signed)
  Echocardiogram 2D Echocardiogram has been performed.  William Flores 11/22/2020, 10:07 AM

## 2020-11-22 NOTE — Progress Notes (Addendum)
Advanced Heart Failure Clinic Note   PCP: [name], [name], MD HF Cardiology: Dr. Aundra Dubin  HPI:  70 y.o. male with history of chronic atrial fibrillation, chronic anticoagulation therapy w/ coumadin, h/o systolic HF (felt to be tachy-mediated, w/ eventual recovery of LVEF), chronic diastolic CHF, RV failure w/ pulmonary HTN, Diabetes, Obesity, OHS/OSA w/ poor compliance w/ CPAP, h/o AKI, h/o GIB, colon cancer treated w/ resection and h/o MGUS.     Patient was admitted in 02/2014 with hypoxic respiratory failure with acute systolic CHF and possible PNA.  He was in atrial fibrillation with RVR and developed AKI. Cardiomyopathy (EF 35-40% by echo in 2016) thought to be tachycardia-mediated at the time.  He was admitted again in 4/16 with hypotension and AKI as well as multiple metabolic derangements.  He required CVVH.  Echo at that time showed EF 60-65%.  He was again admitted in 5/16, again with hypoxic respiratory failure and was intubated.  EF 60-65% by echo on this admission.  He again had AKI and had HD x 1. Renal function again recovered. Also, per records, he had a LHC in 2008 that showed no significant CAD.    Had repeat echo 12/2018 which showed normal LVEF 50-55% w/ moderate LVH, normal LA size, mild MR, severely elevated pulmonary artery systolic pressure, 77 mmHg and moderately reduced right ventricular systolic function. Echo was obtained while admitted for acute anemia 2/2 acute GIB, in the setting of supratherpaeutic INR. INR was > 10.  Hgb 5.1. INR was reversed w/ Kcentra + Vit K. Treated w/ transfusion. Underwent GI w/u. EGD showed chronic gastritis but no clear source of bleeding. Biopsy negative for H Pylori. Colonoscopy showed several polyps, removed and biopsied. No high grade dysplasia. He was placed back on Coumadin. INRs followed by PCP.    Admitted to [facility] for acute on chronic CHF 3/21. Also w/ AKI on admit. SCr was 1.8 (baseline 1.3-1.5). He was started on IV lasix for  diuresis, 60 mg bid. Diuresis sluggish and eventually changed to lasix gtt. SCr has worsened w/ attempts at diuresis, w/ SCr rising to 2.3. Echo repeated. LVEF 55-60%. RV severely enlarged w/ severely reduced RV systolic function. Pulmonary artery systolic pressure severely elevated at 76 mmhg. There is mild TR. Also mild to moderate aortic valve sclerosis/calcification is present, without any evidence of aortic stenosis. He was subsequently transferred to [facility] for further management of his HF w/ formal RHC and AHF consultation.  On arrival to [facility], He was placed on high dose IV Lasix w/ better urinary response/ diuresis. RHC 4/8 with elevated filling pressures, mixed pulmonary venous/pulmonary hypertension. V/Q scan negative for chronic PE. He was continued on IV diuretics and diuresed > 20 lb and placed on PO torsemide 40 mg bid.  SCr improved w/ diuresis. Also started on trial of sildenafil 20 mg tid. Advised to continue supplemental home O2 and to restart regular use of CPAP. Also of note, a multiple myeloma panel was done given MGUS and showed elevated M spike protein. Urine immunofixation and UPEP both unremarkable. Discharge wt was 261 lb.   Patient was admitted in 6/22 with syncope.  He was at his PCP's office, stood up, and passed out.  AED was applied, no shock. CPR was done with ROSC < 1 minute. Echo showed EF 50-55%, mild LVH, D-shaped septum, severe RV enlargement with severely decreased RV systolic function, PASP 90, IVC dilated.  He was discharged to a rehab facility in [place]. He was supposed to get a  Zio monitor at discharge but this was not done.  Creatinine was elevated to 2.5 during this hospitalization.   In 10/22, he was admitted with GI bleeding/hematochezia.  He had large, oozing rectal polyps with high grade dysplasia.    Today he returns for HF follow up. He lives in a SNF in [place].  He is not using CPAP. He wears oxygen all the time.  He is minimally active, gets around in  wheelchair for the most part. Dyspnea with most exertion.  No chest pain.  +Orthopnea.  No lightheadedness.   Labs (6/22): K 4.8, creatinine 2.5 Labs (7/22): K 3.8, creatinine 2.34 Labs (11/22): ANA 1:80, K 4.1, creatinine 2.22, LFTs normal, hgb 9  ROS: All systems reviewed and negative except as per HPI.   PMH: 1. Chronic atrial fibrillation.  - Zio monitor (9/22): atrial fibrillation, average rate 81 bpm.  4% PVCs, 2 short NSVT runs.  2. Chronic systolic => diastolic CHF with prominent RV failure: LHC 2008 with no significant CAD but EF 25%.  Echo (2/16) with EF 35-40% (?tachy-mediated CMP).  Echo (5/16) with EF 65-60%, mild MR, mild RV dilation with mildly decreased RV systolic function.  - RHC (4/21): mean RA 16, PA 95/32 mean 53, mean PCWP 23, CI 2.71, PVR 5 WU.  - echo (4/21): EF 55-60%, severe RV dilation and severe RV dysfunction, PASP 76 mmHg.  - PYP scan (5/21): grade 1, H/CL 0.9 (likely negative).  - Echo (6/22): EF 50-55%, mild LVH, D-shaped septum, severe RV enlargement with severely decreased RV systolic function, PASP 90, IVC dilated. 3. OHS/OSA: Uses CPAP at night, oxygen during the day.  4. Colon cancer s/p resection 5. Type II diabetes 6. CKD stage 3: With episodes of AKI. 7. Hyperlipidemia 8. HTN 9. Pulmonary hypertension: Suspect group 3 PH.  - RHC (4/21): mean RA 16, PA 95/32 mean 53, mean PCWP 23, CI 2.71, PVR 5 WU.  - echo (4/21): EF 55-60%, severe RV dilation and severe RV dysfunction, PASP 76 mmHg.  - V/Q scan 4/21 was negative.  10. H/o GI bleeding.  - 10/22 GI bleeding from large rectal polyps, high grade dysplasia noted.  11. Syncope: 6/22.  12. Cirrhosis: By imaging.  With splenomegaly and ascites.   No current facility-administered medications for this encounter.   No current outpatient medications on file.   Facility-Administered Medications Ordered in Other Encounters  Medication Dose Route Frequency Provider Last Rate Last Admin   acetaminophen  (TYLENOL) tablet 650 mg  650 mg Oral Q6H PRN Kathie Dike, MD   650 mg at 11/22/20 1944   allopurinol (ZYLOPRIM) tablet 300 mg  300 mg Oral Daily Kathie Dike, MD   300 mg at 11/22/20 1759   atorvastatin (LIPITOR) tablet 20 mg  20 mg Oral Daily Adefeso, Oladapo, DO   20 mg at 11/22/20 6834   Chlorhexidine Gluconate Cloth 2 % PADS 6 each  6 each Topical Daily Kathie Dike, MD   6 each at 11/22/20 1623   furosemide (LASIX) injection 60 mg  60 mg Intravenous BID Kathie Dike, MD   60 mg at 11/22/20 1737   insulin aspart (novoLOG) injection 0-5 Units  0-5 Units Subcutaneous QHS Adefeso, Oladapo, DO       insulin aspart (novoLOG) injection 0-9 Units  0-9 Units Subcutaneous TID WC Adefeso, Oladapo, DO   1 Units at 11/22/20 1259   insulin glargine-yfgn (SEMGLEE) injection 16 Units  16 Units Subcutaneous QHS Kathie Dike, MD  melatonin tablet 3 mg  3 mg Oral QHS PRN Kathie Dike, MD       metoprolol succinate (TOPROL-XL) 24 hr tablet 25 mg  25 mg Oral Daily Kathie Dike, MD   25 mg at 11/22/20 1759   oxybutynin (DITROPAN) tablet 5 mg  5 mg Oral TID Kathie Dike, MD   5 mg at 11/22/20 1759   pantoprazole (PROTONIX) EC tablet 40 mg  40 mg Oral Daily Adefeso, Oladapo, DO   40 mg at 11/22/20 0911   polyethylene glycol (MIRALAX / GLYCOLAX) packet 17 g  17 g Oral Daily Kathie Dike, MD       sildenafil (REVATIO) tablet 20 mg  20 mg Oral TID Adefeso, Oladapo, DO   20 mg at 11/22/20 1623   Warfarin - Pharmacist Dosing Inpatient   Does not apply q1600 Kathie Dike, MD       Allergies  Allergen Reactions   Codeine     Headache    Social History   Socioeconomic History   Marital status: Divorced    Spouse name: Not on file   Number of children: 1   Years of education: 52   Highest education level: 12th grade  Occupational History    Employer: [employer]  Tobacco Use   Smoking status: Former    Types: Cigarettes    Quit date: 08/06/1983    Years since  quitting: 37.3   Smokeless tobacco: Never  Vaping Use   Vaping Use: Never used  Substance and Sexual Activity   Alcohol use: No   Drug use: No   Sexual activity: Not Currently  Other Topics Concern   Not on file  Social History Narrative   Lives alone   Social Determinants of Health   Financial Resource Strain: Low Risk    Difficulty of Paying Living Expenses: Not very hard  Food Insecurity: Not on file  Transportation Needs: Not on file  Physical Activity: Inactive   Days of Exercise per Week: 0 days   Minutes of Exercise per Session: 0 min  Stress: Not on file  Social Connections: Socially Isolated   Frequency of Communication with Friends and Family: More than three times a week   Frequency of Social Gatherings with Friends and Family: More than three times a week   Attends Religious Services: Never   Marine scientist or Organizations: No   Attends Music therapist: Never   Marital Status: Divorced  Human resources officer Violence: Not on file   Family History  Problem Relation Age of Onset   Breast cancer Mother    Diabetes Mother    Heart attack Father    BP 140/80   Pulse 92   Wt 130.2 kg (287 lb)   SpO2 95% Comment: 4 l n/c  BMI 49.26 kg/m   Wt Readings from Last 3 Encounters:  11/22/20 130.2 kg (287 lb)  11/20/20 130.2 kg (287 lb)  10/24/20 118.8 kg (262 lb)   PHYSICAL EXAM: General: NAD, chronically ill-appearing Neck: JVP 16 cm, no thyromegaly or thyroid nodule.  Lungs: Clear to auscultation bilaterally with normal respiratory effort. CV: Nondisplaced PMI.  Heart irregular S1/S2, no S3/S4, no murmur.  1+ edema to thighs.  No carotid bruit.  Normal pedal pulses.  Abdomen: Soft, nontender, no hepatosplenomegaly, moderate distention.  Skin: Intact without lesions or rashes.  Neurologic: Alert and oriented x 3.  Psych: Normal affect. Extremities: No clubbing or cyanosis.  HEENT: Normal. ;  ASSESSMENT & PLAN: 1. Cor  pulmonale/RV  failure: Suspect primarily due to OHS/OSA. Echo in 4/21 with EF 55-60%, RV severely dilated w/ severe RV dysfunction in the setting of severe pulmonary hypertension w/ PASP 76 mmHg. [place] 4/21 with elevated filling pressures, mixed pulmonary venous/pulmonary hypertension=> diuresed w/ IV Lasix in hospital and placed on po torsemide. Echo in 6/22 showed EF 50-55%, mild LVH, D-shaped septum, severe RV enlargement with severely decreased RV systolic function, PASP 90, IVC dilated. NYHA class III, functional class difficult due to general physical deconditioning and body habitus.  He is volume overloaded on exam with weight up about 37 lbs since last appointment.   - Increase torsemide to 80 mg bid.  - Metolazone 2.5 mg once daily x 2 days then once weekly on Saturdays. Take KCl 20 with metolazone.  - BMET today and in 10 days.  2. Pulmonary HTN: PASP elevated at 76 mmHg by 4/21 echo estimate with RV systolic function severely reduced. Most likely poorly controlled OHS/OSA as cause (group 3 PH).  V/Q scan negative for chronic PE.  Mixed PVH/PAH on RHC.  Echo in 6/22 with severe RV failure and PASP 90 mmHg.  - Continue sildenafil 20 mg tid.  - Continue home oxygen and CPAP at night (needs to restart using).    3. Stage III CKD: BMET today.  4. Atrial fibrillation: Chronic, unlikely to be able to successfully cardiovert given long-standing atrial fibrillation.  - Continue Toprol XL 50 mg daily.   - Continue warfarin.  5. OSA/OHS: Continue CPAP at night (needs to use) and continue oxygen during the day.  6. Diabetes: - Management per PCP.  7. MGUS: Previously followed by hem/onc but has not seen in several years.  Multiple myeloma panel 4/21 with presence of M-spike. - Followup with heme/onc.  8. Cirrhosis: ?Due to NAFLD. If creatinine remains stable with diuretic adjustment, will try to add spironolactone.   Followup in 3 wks with APP.  Loralie Champagne, MD 11/22/20

## 2020-11-22 NOTE — ED Notes (Signed)
U/S AND echo [facility] at bedside.

## 2020-11-22 NOTE — H&P (Signed)
History and Physical  William Flores TAV:[id number] DOB: [birth year] DOA: 11/22/2020  Referring physician: Orpah Greek, MD PCP: [name], [name], MD  Patient coming from: [facility]  Chief Complaint: Groin swelling  HPI: William Flores is a 70 y.o. male with medical history significant for chronic atrial fibrillation on warfarin, CKD stage IIIb, type II DM, hypertension, hyperlipidemia, GERD, chronic HFpEF (LVEF on 10/16/2020 was 55-60%) and hepatic cirrhosis who presents to the emergency department for evaluation of increased leg swelling and abdominal swelling that has been ongoing for several weeks.  Patient states that he followed up with his cardiologist (Dr. Aundra Dubin) about 2 days ago due to the increased swelling and his diuretic was increased.  He complained of significant testicular swelling since his visit to the cardiologist about 2 days ago, but he denies abdominal or testicular pain, fever, nausea, diarrhea, worsening shortness of breath. Patient was recently admitted from 10/12-10/22 due to GI bleed/hematochezia and acute blood loss anemia  ED Course:  In the emergency department, he was tachypneic and intermittently tachycardic.  O2 sat was 94-97% on supplemental oxygen via [place] at 4 LPM.  Work-up in the ED showed hyperglycemia, BUN/creatinine 58/2.08, albumin 3.4, normocytic anemia, thrombocytopenia.  BNP 270. Chest x-ray showed cardiomegaly with mild perihilar vascular congestion and basilar interstitial edema with small pleural effusions. IV Lasix 80 mg x 1 was given.  Hospitalist was asked to admit patient for further evaluation and management.  Review of Systems: Constitutional: Negative for chills and fever.  HENT: Negative for ear pain and sore throat.   Eyes: Negative for pain and visual disturbance.  Respiratory: Negative for cough, chest tightness and shortness of breath.   Cardiovascular: Positive for increased leg swelling.  Negative for chest pain and  palpitations.  Gastrointestinal: Positive for distention.  Negative for abdominal pain and vomiting.  Endocrine: Negative for polyphagia and polyuria.  Genitourinary: Positive for scrotal swelling.  Negative for decreased urine volume, dysuria, enuresis Musculoskeletal: Negative for arthralgias and back pain.  Skin: Negative for color change and rash.  Allergic/Immunologic: Negative for immunocompromised state.  Neurological: Negative for tremors, syncope, speech difficulty, light-headedness and headaches.  Hematological: Does not bruise/bleed easily.  All other systems reviewed and are negative  Past Medical History:  Diagnosis Date   CAD (coronary artery disease)    LHC 2/08:  mRCA 50%, o/w no sig CAD, EF 25%   Cardiomyopathy ([place])    EF 35-40% in 2/16 in setting of AF with RVR >> echo 5/16 with improved LVF with EF 65-70%   Chronic atrial fibrillation (HCC)    Chronic diastolic heart failure (HCC)    HFPF - EF ~65-70% (OFTEN EXACERBATED BY AFIB)   CKD (chronic kidney disease)    hx of worsening renal failure and hyperK+ in setting of acute diast CHF >> required CVVHD in 4/16 and dialysis x 1 in 5/16   CKD stage 3 due to type 2 diabetes mellitus ([place]) 09/17/2015   Colon cancer ([place])    Colon polyps 09/21/2012   Tubular adenoma   Diabetes ([place])    Hyperlipidemia    Hypertension    Obesity hypoventilation syndrome ([place])    Renal insufficiency    Sleep apnea    Past Surgical History:  Procedure Laterality Date   CIRCUMCISION     COLON RESECTION     COLONOSCOPY N/A 09/21/2012   Procedure: COLONOSCOPY;  Surgeon: Inda Castle, MD;  Location: [facility];  Service: Endoscopy;  Laterality: N/A;   COLONOSCOPY  N/A 12/28/2018   Procedure: COLONOSCOPY;  Surgeon: Rogene Houston, MD;  Location: [facility];  Service: Endoscopy;  Laterality: N/A;   COLONOSCOPY WITH PROPOFOL N/A 10/17/2020   Procedure: COLONOSCOPY WITH PROPOFOL;  Surgeon: Harvel Quale, MD;  Location:  [facility];  Service: Gastroenterology;  Laterality: N/A;   ENTEROSCOPY N/A 06/24/2020   Procedure: ENTEROSCOPY;  Surgeon: Gatha Mayer, MD;  Location: [facility] ENDOSCOPY;  Service: Endoscopy;  Laterality: N/A;   ESOPHAGOGASTRODUODENOSCOPY N/A 12/28/2018   Procedure: ESOPHAGOGASTRODUODENOSCOPY (EGD);  Surgeon: Rogene Houston, MD;  Location: [facility];  Service: Endoscopy;  Laterality: N/A;   POLYPECTOMY  10/17/2020   Procedure: POLYPECTOMY;  Surgeon: Harvel Quale, MD;  Location: [facility];  Service: Gastroenterology;;  rectal, transverse, descending, ascending, sigmoid   RIGHT HEART CATH N/A 04/11/2019   Procedure: RIGHT HEART CATH;  Surgeon: Larey Dresser, MD;  Location: [facility];  Service: Cardiovascular;  Laterality: N/A;   US ECHOCARDIOGRAPHY  03/04/2010   abnormal study    Social History:  reports that he quit smoking about 37 years ago. His smoking use included cigarettes. He has never used smokeless tobacco. He reports that he does not drink alcohol and does not use drugs.   Allergies  Allergen Reactions   Codeine     Headache     Family History  Problem Relation Age of Onset   Breast cancer Mother    Diabetes Mother    Heart attack Father      Prior to Admission medications   Medication Sig Start Date End Date Taking? Authorizing Provider  acetaminophen (TYLENOL) 325 MG tablet Take 2 tablets (650 mg total) by mouth every 6 (six) hours as needed for mild pain (or Fever >/= 101). 01/01/19   Roxan Hockey, MD  allopurinol (ZYLOPRIM) 300 MG tablet Take 1 tablet (300 mg total) by mouth daily. 12/20/19   [name], [name], MD  atorvastatin (LIPITOR) 20 MG tablet TAKE 1 TABLET BY MOUTH EVERY DAY 03/19/20   [name], [name], MD  benzonatate (TESSALON) 100 MG capsule Take 1 capsule (100 mg total) by mouth 2 (two) times daily as needed for cough. 06/25/20   Patrecia Pour, MD  Dulaglutide (TRULICITY) 1.5 GG/8.3MO SOPN Inject 1.5 mg into the skin  once a week. 12/20/19   [name], [name], MD  ertugliflozin L-PyroglutamicAc (STEGLATRO) 5 MG TABS tablet Take 5 mg by mouth daily.    [provider]  ferrous sulfate 325 (65 FE) MG tablet Take 325 mg by mouth daily with breakfast.     [provider]  glucose blood (CONTOUR NEXT TEST) test strip Test BS TID Dx E11.29 05/22/20   [name], [name], MD  Insulin Glargine (LANTUS SOLOSTAR [place]) Inject into the skin. 16 units    [provider]  insulin glargine-yfgn (SEMGLEE) 100 UNIT/ML Pen Inject 16 Units into the skin at bedtime.    [provider]  Insulin Pen Needle 32G X 4 MM MISC Use to give insulin daily Dx E11.29 04/15/19   Charlynne Cousins, MD  melatonin 5 MG TABS Take 5 mg by mouth at bedtime as needed.    [provider]  metoprolol succinate (TOPROL-XL) 25 MG 24 hr tablet Take 1 tablet (25 mg total) by mouth daily. 10/25/20   Orson Eva, MD  OXYGEN Inhale into the lungs. Continuous    [provider]  pantoprazole (PROTONIX) 40 MG tablet TAKE 1 TABLET BY MOUTH EVERY DAY 06/02/20  [name], [name], MD  polyethylene glycol (MIRALAX / GLYCOLAX) 17 g packet Take 17 g by mouth daily. 01/01/19   Roxan Hockey, MD  sildenafil (REVATIO) 20 MG tablet Take 1 tablet (20 mg total) by mouth 3 (three) times daily. 08/24/20   Larey Dresser, MD  torsemide (DEMADEX) 20 MG tablet Take 3 tablets (60 mg total) by mouth 2 (two) times daily. 09/29/20   Larey Dresser, MD  warfarin (COUMADIN) 7.5 MG tablet Take 7.5 mg by mouth daily.    [provider]  diltiazem [facility]) 240 MG 24 hr capsule TAKE 1 CAPSULE DAILY 10/19/12 02/17/13  Vernie Shanks, MD    Physical Exam: BP 130/64   Pulse 98   Temp 98.3 F (36.8 C) (Oral)   Resp (!) 22   Ht 5' 4"  (1.626 m)   Wt 130.2 kg   SpO2 97%   BMI 49.26 kg/m   General: 70 y.o. year-old male well developed well nourished in no acute distress.  Alert and oriented x3. HEENT: NCAT,  EOMI Neck: Supple, trachea medial Cardiovascular: Regular rate and rhythm with no rubs or gallops.  No thyromegaly or JVD noted.  No lower extremity edema. 2/4 pulses in all 4 extremities. Respiratory: Clear to auscultation with no wheezes or rales. Good inspiratory effort. Abdomen: Soft, nontender but distended abdomen with normal bowel sounds x4 quadrants. Genitourinary: Scrotal edema bilaterally Muskuloskeletal: +2 edema bilaterally to the knees.  Bilateral chronic venous stasis dermatitis.   Neuro: CN II-XII intact, strength 5/5 x 4, sensation, reflexes intact Skin: No ulcerative lesions noted or rashes Psychiatry: Judgement and insight appear normal. Mood is appropriate for condition and setting          Labs on Admission:  Basic Metabolic Panel: Recent Labs  Lab 11/20/20 1128 11/22/20 0424  NA 139 140  K 3.9 4.2  CL 103 101  CO2 31 30  GLUCOSE 108* 134*  BUN 52* 58*  CREATININE 2.09* 2.08*  CALCIUM 8.5* 8.6*   Liver Function Tests: Recent Labs  Lab 11/22/20 0424  AST 19  ALT 16  ALKPHOS 79  BILITOT 0.9  PROT 6.8  ALBUMIN 3.4*   No results for input(s): LIPASE, AMYLASE in the last 168 hours. Recent Labs  Lab 11/22/20 0425  AMMONIA 35   CBC: Recent Labs  Lab 11/20/20 1128 11/22/20 0424  WBC 4.4 4.8  NEUTROABS  --  3.6  HGB 9.1* 8.9*  HCT 33.9* 32.8*  MCV 93.4 94.3  PLT 93* 102*   Cardiac Enzymes: No results for input(s): CKTOTAL, CKMB, CKMBINDEX, TROPONINI in the last 168 hours.  BNP (last 3 results) Recent Labs    10/17/20 0428 11/20/20 1128 11/22/20 0425  BNP 290.0* 306.1* 270.0*    ProBNP (last 3 results) No results for input(s): PROBNP in the last 8760 hours.  CBG: No results for input(s): GLUCAP in the last 168 hours.  Radiological Exams on Admission: DG Chest Port 1 View  Result Date: 11/22/2020 CLINICAL DATA:  swelling and shortness of breath. EXAM: PORTABLE CHEST 1 VIEW COMPARISON:  Portable chest 10/15/2020. FINDINGS: The  heart is enlarged. Mild perihilar vascular congestion continues to be seen with mild lung base interstitial edema and small pleural effusions. No focal pneumonia is seen. Similar findings were noted previously. Osteopenia. IMPRESSION: Cardiomegaly with mild perihilar vascular congestion and basilar interstitial edema with small pleural effusions. The overall aeration seems unchanged. Electronically Signed   By: Telford Nab M.D.   On: 11/22/2020 04:01  EKG: I independently viewed the EKG done and my findings are as followed: A. fib with RVR with PVCs  Assessment/Plan Present on Admission:  DM (diabetes mellitus), type 2 with renal complications ([place])  Essential hypertension  Liver cirrhosis secondary to NASH [facility])  Principal Problem:   Acute exacerbation of CHF (congestive heart failure) (HCC) Active Problems:   Anticoagulated on Coumadin   DM (diabetes mellitus), type 2 with renal complications (HCC)   Chronic respiratory failure with hypoxia (HCC)   Essential hypertension   CKD (chronic kidney disease), stage III (HCC)   Mixed hyperlipidemia   Morbid obesity with BMI of 45.0-49.9, adult (HCC)   Liver cirrhosis secondary to NASH (HCC)   Elevated brain natriuretic peptide (BNP) level   Thrombocytopenia (HCC)  Acute on chronic diastolic CHF, rule out combined systolic CHF Chest x-ray showed cardiomegaly with mild perihilar vascular congestion and basilar interstitial edema with small pleural effusions. Echocardiogram done on 10/16/2020 showed LVEF of 55 to 60%,no WMA, IV septum flattened; severe enlarged RV Continue Lasix 60 mg twice daily (as tolerated by BP) Echocardiogram in the morning Consider cardiology consult in the morning based on echo findings  Elevated BNP in the setting of above Continue management as described above  Chronic respiratory failure with hypoxia Continue supplemental oxygen per home regimen  Thrombocytopenia/hepatic cirrhosis Platelets 102, this is  possibly secondary to liver cirrhosis Abd ultrasound done on 10/14/2020 shows cirrhosis with splenomegaly and perihepatic ascites with findings suggestive of portal hypertension. Portal venous system is patent with normal direction of flow. Low volume ascites insufficient for paracentesis. Ultrasound will be repeated  Chronic kidney disease stage IIIb BUN/creatinine 58/2.08 (baseline creatinine 1.8-2.2) Renally adjust medications, avoid nephrotoxic agents/dehydration/hypotension  Atrial fibrillation on Coumadin INR was 2.7, continue Coumadin Metoprolol will be temporarily held at this time due to soft BP  Essential hypertension BP meds will be held at this time due to soft BP  Type 2 diabetes mellitus Continue ISS and hypoglycemic protocol Semglee will be reduced to 5 units (home dose at 16 units).  Adjust dose as tolerated  Mixed hyperlipidemia Continue Lipitor  Morbid obesity (49.26 kg/m) Diet and lifestyle modification advised  OSA on CPAP Continue CPAP  Pulmonary hypertension Continue sildenafil  GERD Continue Protonix  Insomnia Continue melatonin   DVT prophylaxis: Warfarin  Code Status: Full code  Family Communication: None at bedside  Disposition Plan:  Patient is from:                        home Anticipated DC to:                   SNF or family members home Anticipated DC date:               2-3 days Anticipated DC barriers:         Patient requires inpatient management due to acute on chronic diastolic CHF  Consults called: None  Admission status: Inpatient    Bernadette Hoit MD [facility]  11/22/2020, 6:21 AM

## 2020-11-23 DIAGNOSIS — I4891 Unspecified atrial fibrillation: Secondary | ICD-10-CM | POA: Diagnosis not present

## 2020-11-23 DIAGNOSIS — J9611 Chronic respiratory failure with hypoxia: Secondary | ICD-10-CM | POA: Diagnosis not present

## 2020-11-23 DIAGNOSIS — I5033 Acute on chronic diastolic (congestive) heart failure: Secondary | ICD-10-CM | POA: Diagnosis not present

## 2020-11-23 DIAGNOSIS — Z7901 Long term (current) use of anticoagulants: Secondary | ICD-10-CM | POA: Diagnosis not present

## 2020-11-23 LAB — COMPREHENSIVE METABOLIC PANEL
ALT: 14 U/L (ref 0–44)
AST: 16 U/L (ref 15–41)
Albumin: 3.2 g/dL — ABNORMAL LOW (ref 3.5–5.0)
Alkaline Phosphatase: 70 U/L (ref 38–126)
Anion gap: 7 (ref 5–15)
BUN: 64 mg/dL — ABNORMAL HIGH (ref 8–23)
CO2: 31 mmol/L (ref 22–32)
Calcium: 8.5 mg/dL — ABNORMAL LOW (ref 8.9–10.3)
Chloride: 100 mmol/L (ref 98–111)
Creatinine, Ser: 2.28 mg/dL — ABNORMAL HIGH (ref 0.61–1.24)
GFR, Estimated: 30 mL/min — ABNORMAL LOW (ref >60)
Glucose, Bld: 137 mg/dL — ABNORMAL HIGH (ref 70–99)
Potassium: 4.3 mmol/L (ref 3.5–5.1)
Sodium: 138 mmol/L (ref 135–145)
Total Bilirubin: 1 mg/dL (ref 0.3–1.2)
Total Protein: 6.4 g/dL — ABNORMAL LOW (ref 6.5–8.1)

## 2020-11-23 LAB — CBC
HCT: 33.6 % — ABNORMAL LOW (ref 39.0–52.0)
Hemoglobin: 9 g/dL — ABNORMAL LOW (ref 13.0–17.0)
MCH: 25.2 pg — ABNORMAL LOW (ref 26.0–34.0)
MCHC: 26.8 g/dL — ABNORMAL LOW (ref 30.0–36.0)
MCV: 94.1 fL (ref 80.0–100.0)
Platelets: 103 10*3/uL — ABNORMAL LOW (ref 150–400)
RBC: 3.57 MIL/uL — ABNORMAL LOW (ref 4.22–5.81)
RDW: 19.8 % — ABNORMAL HIGH (ref 11.5–15.5)
WBC: 5.6 10*3/uL (ref 4.0–10.5)
nRBC: 0 % (ref 0.0–0.2)

## 2020-11-23 LAB — APTT: aPTT: 57 seconds — ABNORMAL HIGH (ref 24–36)

## 2020-11-23 LAB — GLUCOSE, CAPILLARY
Glucose-Capillary: 101 mg/dL — ABNORMAL HIGH (ref 70–99)
Glucose-Capillary: 141 mg/dL — ABNORMAL HIGH (ref 70–99)
Glucose-Capillary: 129 mg/dL — ABNORMAL HIGH (ref 70–99)
Glucose-Capillary: 168 mg/dL — ABNORMAL HIGH (ref 70–99)

## 2020-11-23 LAB — PROTIME-INR
INR: 3 — ABNORMAL HIGH (ref 0.8–1.2)
Prothrombin Time: 30.8 seconds — ABNORMAL HIGH (ref 11.4–15.2)

## 2020-11-23 MED ORDER — ALUM & MAG HYDROXIDE-SIMETH 200-200-20 MG/5ML PO SUSP
30.0000 mL | ORAL | Status: DC | PRN
  Administered 2020-11-23 – 2020-12-09 (×9): 30 mL via ORAL
  Filled 2020-11-23 (×10): qty 30

## 2020-11-23 MED ORDER — GUAIFENESIN-DM 100-10 MG/5ML PO SYRP
5.0000 mL | ORAL_SOLUTION | ORAL | Status: DC | PRN
  Administered 2020-11-23 – 2020-12-06 (×9): 5 mL via ORAL
  Filled 2020-11-23 (×11): qty 5

## 2020-11-23 MED ORDER — TAMSULOSIN HCL 0.4 MG PO CAPS
0.4000 mg | ORAL_CAPSULE | Freq: Every day | ORAL | Status: DC
Start: 2020-11-23 — End: 2020-12-02
  Administered 2020-11-23 – 2020-12-02 (×10): 0.4 mg via ORAL
  Filled 2020-11-23 (×10): qty 1

## 2020-11-23 MED ORDER — TRAMADOL HCL 50 MG PO TABS
50.0000 mg | ORAL_TABLET | Freq: Four times a day (QID) | ORAL | Status: DC | PRN
  Administered 2020-11-23 – 2020-11-24 (×2): 50 mg via ORAL
  Filled 2020-11-23 (×2): qty 1

## 2020-11-23 MED ORDER — ORAL CARE MOUTH RINSE
15.0000 mL | Freq: Two times a day (BID) | OROMUCOSAL | Status: DC
  Administered 2020-11-23 – 2020-12-11 (×35): 15 mL via OROMUCOSAL

## 2020-11-23 MED ORDER — DIPHENHYDRAMINE HCL 25 MG PO CAPS
25.0000 mg | ORAL_CAPSULE | Freq: Once | ORAL | Status: AC
  Administered 2020-11-23: 25 mg via ORAL
  Filled 2020-11-23: qty 1

## 2020-11-23 NOTE — TOC Progression Note (Signed)
Transition of Care [facility]) - Progression Note    Patient Details  Name: MORRELL FLUKE MRN: [id number] Date of Birth: 1950-06-07  [facility] CM/SW Contact  Boneta Lucks, RN Phone Number: 11/23/2020, 10:58 AM  Clinical Narrative:   Patient admitted with Acute exacerbation of CHF. Patient has recently been assess for CHF. Patient is from [place]. Will return after diuresing.   Expected Discharge Plan: Skilled Nursing Facility Barriers to Discharge: Continued Medical Work up  Expected Discharge Plan and Services Expected Discharge Plan: [place]      Readmission Risk Interventions Readmission Risk Prevention Plan 11/23/2020 10/24/2020 10/15/2020  Transportation Screening Complete Complete Complete  PCP or Specialist Appt within 3-5 Days - - -  HRI or [place] - - Complete  Social Work Consult for [place] Planning/Counseling - - Complete  Palliative Care Screening - - Not Applicable  Medication Review Press photographer) Complete Complete Complete  PCP or Specialist appointment within 3-5 days of discharge Complete (No Data) -  [place] or Home Care Consult Complete Complete -  SW Recovery Care/Counseling Consult Complete Complete -  Palliative Care Screening - Not Applicable -  Skilled Nursing Facility Complete Complete -  Some recent data might be hidden

## 2020-11-23 NOTE — Progress Notes (Signed)
Pt refuse CPAP machine for tonight

## 2020-11-23 NOTE — Progress Notes (Signed)
ANTICOAGULATION CONSULT NOTE -   Pharmacy Consult for warfarin Indication: atrial fibrillation  Allergies  Allergen Reactions   Codeine     Headache     Patient Measurements: Height: 5' 4"  (162.6 cm) Weight: 129.4 kg (285 lb 3.2 oz) IBW/kg (Calculated) : 59.2  Vital Signs: Temp: 98.4 F (36.9 C) (11/21 0759) Temp Source: Oral (11/21 0759) BP: 122/70 (11/21 0759) Pulse Rate: 96 (11/21 0759)  Labs: Recent Labs    11/20/20 1128 11/22/20 0424 11/23/20 0422  HGB 9.1* 8.9* 9.0*  HCT 33.9* 32.8* 33.6*  PLT 93* 102* 103*  APTT  --   --  57*  LABPROT  --  28.6* 30.8*  INR  --  2.7* 3.0*  CREATININE 2.09* 2.08* 2.28*     Estimated Creatinine Clearance: 38.3 mL/min (A) (by C-G formula based on SCr of 2.28 mg/dL (H)).   Medical History: Past Medical History:  Diagnosis Date   CAD (coronary artery disease)    LHC 2/08:  mRCA 50%, o/w no sig CAD, EF 25%   Cardiomyopathy ([place])    EF 35-40% in 2/16 in setting of AF with RVR >> echo 5/16 with improved LVF with EF 65-70%   Chronic atrial fibrillation (HCC)    Chronic diastolic heart failure (HCC)    HFPF - EF ~65-70% (OFTEN EXACERBATED BY AFIB)   CKD (chronic kidney disease)    hx of worsening renal failure and hyperK+ in setting of acute diast CHF >> required CVVHD in 4/16 and dialysis x 1 in 5/16   CKD stage 3 due to type 2 diabetes mellitus ([place]) 09/17/2015   Colon cancer ([place])    Colon polyps 09/21/2012   Tubular adenoma   Diabetes ([place])    Hyperlipidemia    Hypertension    Obesity hypoventilation syndrome ([place])    Renal insufficiency    Sleep apnea     Medications:  Medications Prior to Admission  Medication Sig Dispense Refill Last Dose   allopurinol (ZYLOPRIM) 300 MG tablet Take 1 tablet (300 mg total) by mouth daily. 90 tablet 3    insulin degludec (TRESIBA) 100 UNIT/ML FlexTouch Pen Inject 24 Units into the skin at bedtime.      KLOR-CON M20 20 MEQ tablet Take 0.5 tablets by mouth daily.      metoprolol  succinate (TOPROL-XL) 50 MG 24 hr tablet Take 75 mg by mouth daily. Takes 1.5 tabs TID (18m 3 times daily)      pantoprazole (PROTONIX) 40 MG tablet TAKE 1 TABLET BY MOUTH EVERY DAY 90 tablet 1    sildenafil (REVATIO) 20 MG tablet Take 1 tablet (20 mg total) by mouth 3 (three) times daily. 90 tablet 11    torsemide (DEMADEX) 20 MG tablet Take 3 tablets (60 mg total) by mouth 2 (two) times daily. 540 tablet 3    acetaminophen (TYLENOL) 325 MG tablet Take 2 tablets (650 mg total) by mouth every 6 (six) hours as needed for mild pain (or Fever >/= 101). 12 tablet 0    atorvastatin (LIPITOR) 20 MG tablet TAKE 1 TABLET BY MOUTH EVERY DAY 90 tablet 3    benzonatate (TESSALON) 100 MG capsule Take 1 capsule (100 mg total) by mouth 2 (two) times daily as needed for cough.      Dulaglutide (TRULICITY) 1.5 MZC/5.8IFSOPN Inject 1.5 mg into the skin once a week. 6 mL 3    ertugliflozin L-PyroglutamicAc (STEGLATRO) 5 MG TABS tablet Take 5 mg by mouth daily.  ferrous sulfate 325 (65 FE) MG tablet Take 325 mg by mouth daily with breakfast.       glucose blood (CONTOUR NEXT TEST) test strip Test BS TID Dx E11.29 300 strip 3    Insulin Glargine (LANTUS SOLOSTAR [place]) Inject into the skin. 16 units      insulin glargine-yfgn (SEMGLEE) 100 UNIT/ML Pen Inject 16 Units into the skin at bedtime.      Insulin Pen Needle 32G X 4 MM MISC Use to give insulin daily Dx E11.29 100 each 3    melatonin 5 MG TABS Take 5 mg by mouth at bedtime as needed.      metoprolol succinate (TOPROL-XL) 25 MG 24 hr tablet Take 1 tablet (25 mg total) by mouth daily.      OXYGEN Inhale into the lungs. Continuous      polyethylene glycol (MIRALAX / GLYCOLAX) 17 g packet Take 17 g by mouth daily. 14 each 0    warfarin (COUMADIN) 7.5 MG tablet Take 7.5 mg by mouth daily.       Assessment: Pharmacy consulted to dose warfarin in patient with atrial fibrillation.  INR on admission is 2.7 .  Patient is an unreliable historian.  Home dose listed  as 7.5 mg daily per med rec.   Patient recently admitted here in October for GI bleed and per GI recommended INR 2-2.5 INR 2.7> 3, and no dose given yesterday Hgb 9  platelets 103  Goal of Therapy:  INR 2.0-2.5 Monitor platelets by anticoagulation protocol: Yes   Plan:  Hold warfarin for an additional dose today Monitor daily INR and s/s of bleeding.  Isac Sarna, BS Pharm D, BCPS Clinical Pharmacist Pager [phone] 11/23/2020 8:40 AM

## 2020-11-23 NOTE — Progress Notes (Signed)
Removed foley per MD order. Patient tolerated well. Call bell in reach. Patient in chair at this time. Stated to patient to call if he needed to void.

## 2020-11-23 NOTE — Progress Notes (Signed)
PROGRESS NOTE    William Flores  JHE:[id number] DOB: 02-28-1950 DOA: 11/22/2020 PCP: [name], [name], MD    Brief Narrative:  70 year old male with a history of atrial fibrillation on anticoagulation, chronic kidney disease stage IIIb, chronic right-sided heart failure, pulmonary hypertension, cirrhosis, who is currently at [place] skilled nursing facility.  He was admitted to the hospital with increasing lower extremity edema and scrotal swelling.  He was admitted for IV diuretics.   Assessment & Plan:   Principal Problem:   Acute exacerbation of CHF (congestive heart failure) (HCC) Active Problems:   Right heart failure (HCC)   OSA (obstructive sleep apnea)   Anticoagulated on Coumadin   Atrial fibrillation with RVR (HCC)   DM (diabetes mellitus), type 2 with renal complications (HCC)   Chronic respiratory failure with hypoxia (HCC)   Essential hypertension   CKD (chronic kidney disease), stage III (HCC)   Mixed hyperlipidemia   Morbid obesity with BMI of 45.0-49.9, adult ([place])   Pulmonary hypertension (HCC)   Liver cirrhosis secondary to NASH (HCC)   Elevated brain natriuretic peptide (BNP) level   Thrombocytopenia (HCC)   GERD (gastroesophageal reflux disease)   Insomnia   Acute on chronic right-sided heart failure -Reports that he has been compliant with diuretics, his diuretic dose was increased approximately 2 days prior to admission -Currently started on intravenous Lasix -Appears to be having good urine output -Urine output from yesterday recorded 2.4 L -Still has continued evidence of significant volume overload -Continue to monitor intake and output, daily weights  Chronic respiratory failure with hypoxia -Chronically on 2 L of oxygen  Cirrhosis secondary to NASH -Abdominal ultrasound did not show ascites -Appears to be having good urine output with IV Lasix -We will continue with diuretics for now -Can consider paracentesis if he stops responding to  diuresis  Pulmonary hypertension -Continues on sildenafil -Encouraged to use CPAP for sleep apnea  Thrombocytopenia -Related to liver disease -Continue to follow  Diabetes -Continued on basal insulin -Follow blood sugars with sliding scale -Blood sugars currently stable  Chronic atrial fibrillation -Heart rate is currently stable -Continue on outpatient dose of Toprol -He is anticoagulated with Coumadin -Hold further Coumadin for now in light of hematuria  Possible urinary retention -Patient had Foley catheter placed while in the emergency room -Unclear whether he has true urinary retention since I do not see this documented anywhere -Patient reports that he normally is able to pass small amounts of urine -It is unclear whether his significant scrotal/penile edema was impeding his ability to pass his urine -Since overall urine appears to be clearing, will attempt to discontinue Foley catheter to see if he is able to pass urine -If he does not have significant urine output, will need to BladderScan to check for retention -Started on Flomax  Chronic kidney disease stage IIIb -Creatinine is currently at baseline -Continue to monitor with diuresis  Obstructive sleep apnea -Continue on CPAP  Morbid obesity -BMI 49.2 -Increased risk of mobility/mortality  GERD -Continue Protonix     DVT prophylaxis: SCDs Start: 11/22/20 0738  Code Status: Full code Family Communication: Discussed with patient Disposition Plan: Status is: Inpatient  Remains inpatient appropriate because: Needs continued IV diuresis for massive volume overload         Consultants:    Procedures:  Echocardiogram: Preserved LVEF, significant RV dysfunction, unchanged from prior echo  Antimicrobials:      Subjective: Describes discomfort with Foley catheter.  Overall urine in catheter has pink tinge, does  not appear to be grossly bloody.  Still short of breath.  Objective: Vitals:    11/23/20 0759 11/23/20 0953 11/23/20 1214 11/23/20 1600  BP: 122/70 137/88 123/70   Pulse: 96  100   Resp: 20  20   Temp: 98.4 F (36.9 C)  97.8 F (36.6 C)   TempSrc: Oral     SpO2: 95%  (!) 86% 92%  Weight:      Height:        Intake/Output Summary (Last 24 hours) at 11/23/2020 1942 Last data filed at 11/23/2020 1100 Gross per 24 hour  Intake 360 ml  Output 1300 ml  Net -940 ml   Filed Weights   11/22/20 0317 11/23/20 0554  Weight: 130.2 kg 129.4 kg    Examination:  General exam: Alert, awake, oriented x 3 Respiratory system: Crackles at bases respiratory effort normal. Cardiovascular system:RRR. No murmurs, rubs, gallops. Gastrointestinal system: Abdomen is nondistended, soft and nontender. No organomegaly or masses felt. Normal bowel sounds heard. Central nervous system: Alert and oriented. No focal neurological deficits. Extremities: Continues with 1-2+ pitting pedal edema.  Also continues to have significant scrotal edema Skin: No rashes, lesions or ulcers Psychiatry: Judgement and insight appear normal. Mood & affect appropriate.      Data Reviewed: I have personally reviewed following labs and imaging studies  CBC: Recent Labs  Lab 11/20/20 1128 11/22/20 0424 11/23/20 0422  WBC 4.4 4.8 5.6  NEUTROABS  --  3.6  --   HGB 9.1* 8.9* 9.0*  HCT 33.9* 32.8* 33.6*  MCV 93.4 94.3 94.1  PLT 93* 102* 160*   Basic Metabolic Panel: Recent Labs  Lab 11/20/20 1128 11/22/20 0424 11/23/20 0422  NA 139 140 138  K 3.9 4.2 4.3  CL 103 101 100  CO2 31 30 31   GLUCOSE 108* 134* 137*  BUN 52* 58* 64*  CREATININE 2.09* 2.08* 2.28*  CALCIUM 8.5* 8.6* 8.5*  MG  --  2.5*  --   PHOS  --  4.6  --    GFR: Estimated Creatinine Clearance: 38.3 mL/min (A) (by C-G formula based on SCr of 2.28 mg/dL (H)). Liver Function Tests: Recent Labs  Lab 11/22/20 0424 11/23/20 0422  AST 19 16  ALT 16 14  ALKPHOS 79 70  BILITOT 0.9 1.0  PROT 6.8 6.4*  ALBUMIN 3.4* 3.2*    No results for input(s): LIPASE, AMYLASE in the last 168 hours. Recent Labs  Lab 11/22/20 0425  AMMONIA 35   Coagulation Profile: Recent Labs  Lab 11/22/20 0424 11/23/20 0422  INR 2.7* 3.0*   Cardiac Enzymes: No results for input(s): CKTOTAL, CKMB, CKMBINDEX, TROPONINI in the last 168 hours. BNP (last 3 results) No results for input(s): PROBNP in the last 8760 hours. HbA1C: No results for input(s): HGBA1C in the last 72 hours. CBG: Recent Labs  Lab 11/22/20 1726 11/22/20 2120 11/23/20 0749 11/23/20 1120 11/23/20 1553  GLUCAP 115* 145* 101* 141* 129*   Lipid Profile: No results for input(s): CHOL, HDL, LDLCALC, TRIG, CHOLHDL, LDLDIRECT in the last 72 hours. Thyroid Function Tests: No results for input(s): TSH, T4TOTAL, FREET4, T3FREE, THYROIDAB in the last 72 hours. Anemia Panel: No results for input(s): VITAMINB12, FOLATE, FERRITIN, TIBC, IRON, RETICCTPCT in the last 72 hours. Sepsis Labs: No results for input(s): PROCALCITON, LATICACIDVEN in the last 168 hours.  Recent Results (from the past 240 hour(s))  Resp Panel by RT-PCR (Flu A&B, Covid) Nasopharyngeal Swab     Status: None   Collection Time: 11/22/20  5:12 AM   Specimen: Nasopharyngeal Swab; Nasopharyngeal(NP) swabs in vial transport medium  Result Value Ref Range Status   SARS Coronavirus 2 by RT PCR NEGATIVE NEGATIVE Final    Comment: (NOTE) SARS-CoV-2 target nucleic acids are NOT DETECTED.  The SARS-CoV-2 RNA is generally detectable in upper respiratory specimens during the acute phase of infection. The lowest concentration of SARS-CoV-2 viral copies this assay can detect is 138 copies/mL. A negative result does not preclude SARS-Cov-2 infection and should not be used as the sole basis for treatment or other patient management decisions. A negative result may occur with  improper specimen collection/handling, submission of specimen other than nasopharyngeal swab, presence of viral mutation(s) within  the areas targeted by this assay, and inadequate number of viral copies(<138 copies/mL). A negative result must be combined with clinical observations, patient history, and epidemiological information. The expected result is Negative.  Fact Sheet for Patients:  EntrepreneurPulse.com.au  Fact Sheet for Healthcare Providers:  IncredibleEmployment.be  This test is no t yet approved or cleared by the [place] FDA and  has been authorized for detection and/or diagnosis of SARS-CoV-2 by FDA under an Emergency Use Authorization (EUA). This EUA will remain  in effect (meaning this test can be used) for the duration of the COVID-19 declaration under Section 564(b)(1) of the Act, 21 U.S.C.section 360bbb-3(b)(1), unless the authorization is terminated  or revoked sooner.       Influenza A by PCR NEGATIVE NEGATIVE Final   Influenza B by PCR NEGATIVE NEGATIVE Final    Comment: (NOTE) The Xpert Xpress SARS-CoV-2/FLU/RSV plus assay is intended as an aid in the diagnosis of influenza from Nasopharyngeal swab specimens and should not be used as a sole basis for treatment. Nasal washings and aspirates are unacceptable for Xpert Xpress SARS-CoV-2/FLU/RSV testing.  Fact Sheet for Patients: EntrepreneurPulse.com.au  Fact Sheet for Healthcare Providers: IncredibleEmployment.be  This test is not yet approved or cleared by the [place] FDA and has been authorized for detection and/or diagnosis of SARS-CoV-2 by FDA under an Emergency Use Authorization (EUA). This EUA will remain in effect (meaning this test can be used) for the duration of the COVID-19 declaration under Section 564(b)(1) of the Act, 21 U.S.C. section 360bbb-3(b)(1), unless the authorization is terminated or revoked.  Performed at [facility], [address], [place], [place] [address]          Radiology Studies: [facility] 1 View  Result  Date: 11/22/2020 CLINICAL DATA:  swelling and shortness of breath. EXAM: PORTABLE CHEST 1 VIEW COMPARISON:  Portable chest 10/15/2020. FINDINGS: The heart is enlarged. Mild perihilar vascular congestion continues to be seen with mild lung base interstitial edema and small pleural effusions. No focal pneumonia is seen. Similar findings were noted previously. Osteopenia. IMPRESSION: Cardiomegaly with mild perihilar vascular congestion and basilar interstitial edema with small pleural effusions. The overall aeration seems unchanged. Electronically Signed   By: Telford Nab M.D.   On: 11/22/2020 04:01   ECHOCARDIOGRAM COMPLETE  Result Date: 11/22/2020    ECHOCARDIOGRAM REPORT   Patient Name:   JAIDEN DINKINS Date of Exam: 11/22/2020 Medical Rec #:  [id number]      Height:       64.0 in Accession #:    [id number]     Weight:       287.0 lb Date of Birth:  1950-05-26      BSA:          2.281 m Patient Age:    27  years       BP:           136/88 mmHg Patient Gender: M              HR:           61 bpm. Exam Location:  [facility] Procedure: 2D Echo, Cardiac Doppler, Color Doppler and Intracardiac            Opacification Agent Indications:    I50.40* Unspecified combined systolic (congestive) and diastolic                 (congestive) heart failure  History:        Patient has prior history of Echocardiogram examinations, most                 recent 10/06/2020. CHF and Cardiomyopathy, Abnormal ECG,                 Pulmonary HTN, Arrythmias:Atrial Fibrillation; Risk                 Factors:Diabetes, Hypertension and Dyslipidemia. RV failure.  Sonographer:    Roseanna Rainbow RDCS Referring Phys: [id number] OLADAPO ADEFESO  Sonographer Comments: Technically difficult study due to poor echo windows and patient is morbidly obese. Patient moving, grunting and coughing throughout exam. Patient having back pain. Study interrupted by consult. IMPRESSIONS  1. Left ventricular ejection fraction, by estimation, is 55 to 60%. The left  ventricle has normal function. The left ventricle has no regional wall motion abnormalities. The left ventricular internal cavity size was moderately dilated. There is mild left ventricular hypertrophy. Left ventricular diastolic parameters are indeterminate. There is the interventricular septum is flattened in systole and diastole, consistent with right ventricular pressure and volume overload.  2. Right ventricular systolic function is severely reduced. The right ventricular size is severely enlarged. There is severely elevated pulmonary artery systolic pressure.  3. Left atrial size was moderately dilated.  4. Right atrial size was severely dilated.  5. The mitral valve is abnormal. Mild to moderate mitral valve regurgitation. No evidence of mitral stenosis. Moderate mitral annular calcification.  6. Tricuspid valve regurgitation is moderate.  7. The aortic valve is calcified. Aortic valve regurgitation is not visualized. No aortic stenosis is present.  8. The inferior vena cava is dilated in size with <50% respiratory variability, suggesting right atrial pressure of 15 mmHg. FINDINGS  Left Ventricle: Left ventricular ejection fraction, by estimation, is 55 to 60%. The left ventricle has normal function. The left ventricle has no regional wall motion abnormalities. Definity contrast agent was given IV to delineate the left ventricular  endocardial borders. The left ventricular internal cavity size was moderately dilated. There is mild left ventricular hypertrophy. The interventricular septum is flattened in systole and diastole, consistent with right ventricular pressure and volume overload. Left ventricular diastolic parameters are indeterminate. Right Ventricle: The right ventricular size is severely enlarged. Right ventricular systolic function is severely reduced. There is severely elevated pulmonary artery systolic pressure. The tricuspid regurgitant velocity is 4.02 m/s, and with an assumed right atrial  pressure of 15 mmHg, the estimated right ventricular systolic pressure is 67.6 mmHg. Left Atrium: Left atrial size was moderately dilated. Right Atrium: Right atrial size was severely dilated. Pericardium: Trivial pericardial effusion is present. Mitral Valve: The mitral valve is abnormal. Moderate mitral annular calcification. Mild to moderate mitral valve regurgitation. No evidence of mitral valve stenosis. Tricuspid Valve: The tricuspid valve is normal in structure. Tricuspid valve regurgitation is  moderate . No evidence of tricuspid stenosis. Aortic Valve: The aortic valve is calcified. Aortic valve regurgitation is not visualized. No aortic stenosis is present. Pulmonic Valve: The pulmonic valve was normal in structure. Pulmonic valve regurgitation is not visualized. No evidence of pulmonic stenosis. Aorta: The aortic root is normal in size and structure. Venous: The inferior vena cava is dilated in size with less than 50% respiratory variability, suggesting right atrial pressure of 15 mmHg. IAS/Shunts: No atrial level shunt detected by color flow Doppler.  MR Peak grad:   104.4 mmHg   TRICUSPID VALVE MR Mean grad:   70.0 mmHg    TR Peak grad:   64.6 mmHg MR Vmax:        511.00 cm/s  TR Vmax:        402.00 cm/s MR Vmean:       394.0 cm/s MR PISA:        0.25 cm MR PISA Radius: 0.20 cm Kirk Ruths MD Electronically signed by Kirk Ruths MD Signature Date/Time: 11/22/2020/11:35:35 AM    Final    [place] ASCITES (ABDOMEN LIMITED)  Result Date: 11/22/2020 CLINICAL DATA:  Surgeon for ascites. EXAM: LIMITED ABDOMEN ULTRASOUND FOR ASCITES TECHNIQUE: Limited ultrasound survey for ascites was performed in all four abdominal quadrants. COMPARISON:  None. FINDINGS: There is mild free fluid in the right upper quadrant. There is moderate free fluid in the right lower quadrant, left lower quadrant and left upper quadrant. IMPRESSION: Mild to moderate 4 quadrant ascites. Electronically Signed   By: Audie Pinto  M.D.   On: 11/22/2020 10:12        Scheduled Meds:  allopurinol  300 mg Oral Daily   atorvastatin  20 mg Oral Daily   Chlorhexidine Gluconate Cloth  6 each Topical Daily   furosemide  60 mg Intravenous BID   insulin aspart  0-5 Units Subcutaneous QHS   insulin aspart  0-9 Units Subcutaneous TID WC   insulin glargine-yfgn  16 Units Subcutaneous QHS   mouth rinse  15 mL Mouth Rinse BID   metoprolol succinate  25 mg Oral Daily   pantoprazole  40 mg Oral Daily   polyethylene glycol  17 g Oral Daily   sildenafil  20 mg Oral TID   tamsulosin  0.4 mg Oral Daily   Warfarin - Pharmacist Dosing Inpatient   Does not apply q1600   Continuous Infusions:   LOS: 1 day    Time spent: 61mns    JKathie Dike MD [facility]   If 7PM-7AM, please contact night-coverage [email or web address]  11/23/2020, 7:42 PM

## 2020-11-23 NOTE — Progress Notes (Addendum)
central tele called pt had a 10 count of v tech. will continue to monitor. pt sitting up right in chair no complaints of pain or discomfort at the time. Will continue to monitor. MD on call notified. No new orders at this time.

## 2020-11-24 ENCOUNTER — Encounter (HOSPITAL_COMMUNITY): Payer: Self-pay | Admitting: Internal Medicine

## 2020-11-24 ENCOUNTER — Inpatient Hospital Stay (HOSPITAL_COMMUNITY): Payer: Medicare Other

## 2020-11-24 DIAGNOSIS — Z7901 Long term (current) use of anticoagulants: Secondary | ICD-10-CM | POA: Diagnosis not present

## 2020-11-24 DIAGNOSIS — I4891 Unspecified atrial fibrillation: Secondary | ICD-10-CM | POA: Diagnosis not present

## 2020-11-24 DIAGNOSIS — J9611 Chronic respiratory failure with hypoxia: Secondary | ICD-10-CM | POA: Diagnosis not present

## 2020-11-24 DIAGNOSIS — I5033 Acute on chronic diastolic (congestive) heart failure: Secondary | ICD-10-CM | POA: Diagnosis not present

## 2020-11-24 DIAGNOSIS — I50813 Acute on chronic right heart failure: Secondary | ICD-10-CM | POA: Diagnosis not present

## 2020-11-24 LAB — CBC
HCT: 35 % — ABNORMAL LOW (ref 39.0–52.0)
Hemoglobin: 9.5 g/dL — ABNORMAL LOW (ref 13.0–17.0)
MCH: 25.8 pg — ABNORMAL LOW (ref 26.0–34.0)
MCHC: 27.1 g/dL — ABNORMAL LOW (ref 30.0–36.0)
MCV: 95.1 fL (ref 80.0–100.0)
Platelets: 94 10*3/uL — ABNORMAL LOW (ref 150–400)
RBC: 3.68 MIL/uL — ABNORMAL LOW (ref 4.22–5.81)
RDW: 19.7 % — ABNORMAL HIGH (ref 11.5–15.5)
WBC: 4.7 10*3/uL (ref 4.0–10.5)
nRBC: 0 % (ref 0.0–0.2)

## 2020-11-24 LAB — PROTIME-INR
INR: 2.4 — ABNORMAL HIGH (ref 0.8–1.2)
Prothrombin Time: 25.7 seconds — ABNORMAL HIGH (ref 11.4–15.2)

## 2020-11-24 LAB — BASIC METABOLIC PANEL
Anion gap: 8 (ref 5–15)
BUN: 68 mg/dL — ABNORMAL HIGH (ref 8–23)
CO2: 31 mmol/L (ref 22–32)
Calcium: 8.7 mg/dL — ABNORMAL LOW (ref 8.9–10.3)
Chloride: 99 mmol/L (ref 98–111)
Creatinine, Ser: 2.43 mg/dL — ABNORMAL HIGH (ref 0.61–1.24)
GFR, Estimated: 28 mL/min — ABNORMAL LOW (ref >60)
Glucose, Bld: 132 mg/dL — ABNORMAL HIGH (ref 70–99)
Potassium: 4.4 mmol/L (ref 3.5–5.1)
Sodium: 138 mmol/L (ref 135–145)

## 2020-11-24 LAB — GLUCOSE, CAPILLARY
Glucose-Capillary: 113 mg/dL — ABNORMAL HIGH (ref 70–99)
Glucose-Capillary: 140 mg/dL — ABNORMAL HIGH (ref 70–99)
Glucose-Capillary: 174 mg/dL — ABNORMAL HIGH (ref 70–99)

## 2020-11-24 LAB — GRAM STAIN

## 2020-11-24 LAB — BODY FLUID CELL COUNT WITH DIFFERENTIAL
Lymphs, Fluid: 20 %
Monocyte-Macrophage-Serous Fluid: 36 % — ABNORMAL LOW (ref 50–90)
Neutrophil Count, Fluid: 44 % — ABNORMAL HIGH (ref 0–25)
Total Nucleated Cell Count, Fluid: 226 cu mm (ref 0–1000)

## 2020-11-24 LAB — LACTATE DEHYDROGENASE, PLEURAL OR PERITONEAL FLUID: LD, Fluid: 73 U/L — ABNORMAL HIGH (ref 3–23)

## 2020-11-24 MED ORDER — FUROSEMIDE 10 MG/ML IJ SOLN
60.0000 mg | Freq: Once | INTRAMUSCULAR | Status: AC
  Administered 2020-11-24: 60 mg via INTRAVENOUS
  Filled 2020-11-24: qty 6

## 2020-11-24 MED ORDER — WARFARIN SODIUM 5 MG PO TABS
5.0000 mg | ORAL_TABLET | Freq: Once | ORAL | Status: AC
  Administered 2020-11-24: 5 mg via ORAL
  Filled 2020-11-24: qty 1

## 2020-11-24 NOTE — Progress Notes (Signed)
Pt tolerated right sided paracentesis procedure well today and 2.4 Liters of cloudy yellow fluid removed and labs sent for processing. PT vial signs remained stable at completion of procedure and patient transported back to inpatient bed assignment at this time via stretcher. PT verbalized understanding of post procedure instructions.

## 2020-11-24 NOTE — Progress Notes (Signed)
PROGRESS NOTE    William Flores  STM:[id number] DOB: 01-Feb-1950 DOA: 11/22/2020 PCP: [name], [name], MD    Brief Narrative:  70 year old male with a history of atrial fibrillation on anticoagulation, chronic kidney disease stage IIIb, chronic right-sided heart failure, pulmonary hypertension, cirrhosis, who is currently at [place] [facility].  He was admitted to the hospital with increasing lower extremity edema and scrotal swelling.  He was admitted for IV diuretics.   Assessment & Plan:   Principal Problem:   Acute exacerbation of CHF (congestive heart failure) (HCC) Active Problems:   Right heart failure (HCC)   OSA (obstructive sleep apnea)   Anticoagulated on Coumadin   Atrial fibrillation with RVR (HCC)   DM (diabetes mellitus), type 2 with renal complications (HCC)   Chronic respiratory failure with hypoxia (HCC)   Essential hypertension   CKD (chronic kidney disease), stage III (HCC)   Mixed hyperlipidemia   Morbid obesity with BMI of 45.0-49.9, adult ([place])   Pulmonary hypertension (HCC)   Liver cirrhosis secondary to NASH (HCC)   Elevated brain natriuretic peptide (BNP) level   Thrombocytopenia (HCC)   GERD (gastroesophageal reflux disease)   Insomnia   Acute on chronic right-sided heart failure -Reports that he has been compliant with diuretics, his diuretic dose was increased approximately 2 days prior to admission when he saw his primary cardiologist -Currently on intravenous Lasix -difficult to record urine output since foley catheter removed -Weights appear to be trending up -review of records indicate dry weight 261 lbs, current weight 287 lbs -Still has continued evidence of significant volume overload -creatinine trending up with diuresis -? If lasix infusion would be better tolerated -will request cardiology consult -previously followed by advanced heart failure team   Chronic respiratory failure with hypoxia -Chronically on 2 L of  oxygen -currently requiring 4L   Cirrhosis secondary to NASH -Abdominal ultrasound showed mild to mod ascites -since creatinine trending up with diuretics, will request paracentesis -LFTs normal range    Pulmonary hypertension -Continues on sildenafil -Encouraged to use CPAP for sleep apnea   Thrombocytopenia -Related to liver disease -Continue to follow   Diabetes -Continued on basal insulin -Follow blood sugars with sliding scale -Blood sugars currently stable   Chronic atrial fibrillation -Heart rate is currently stable -Continue on outpatient dose of Toprol -He is anticoagulated with Coumadin     Chronic kidney disease stage IIIb -Baseline creatinine appears to be around 2.0 -Creatinine appears to be trending up with diuresis   Obstructive sleep apnea -Continue on CPAP   Morbid obesity -BMI 49.2 -Increased risk of mobility/mortality   GERD -Continue Protonix   DVT prophylaxis: SCDs Start: 11/22/20 0738 warfarin (COUMADIN) tablet 5 mg  Code Status: Full code Family Communication: discussed with patient Disposition Plan: Status is: Inpatient  Remains inpatient appropriate because: continued IV diuresis         Consultants:  Cardiology  Procedures:  Echo: LVEF 89-21%, RV systolic function severely reduced  Antimicrobials:      Subjective: Sleeping on my arrival, wakes up to voice. Says that pain medication is making him drowsy. Continues to have swelling. Reports that he has been able to pass urine since foley removed yesterday. Unfortunately, it does not appear that patient can have a condom catheter due to anatomy. Urine output has been difficult to record.  Objective: Vitals:   11/23/20 2044 11/24/20 0530 11/24/20 0812 11/24/20 1032  BP: 108/72 107/68  98/68  Pulse: 94 93  (!) 102  Resp: 20  20  18  Temp: (!) 97.5 F (36.4 C) 97.6 F (36.4 C)    TempSrc: Oral Oral    SpO2: 91% 99%  91%  Weight:   130.3 kg   Height:       No  intake or output data in the 24 hours ending 11/24/20 1254 Filed Weights   11/22/20 0317 11/23/20 0554 11/24/20 0812  Weight: 130.2 kg 129.4 kg 130.3 kg    Examination:  General exam: Appears calm and comfortable  Respiratory system: crackles at bases. Respiratory effort normal. Cardiovascular system: S1 & S2 heard, RRR. No JVD, murmurs, rubs, gallops or clicks.  Gastrointestinal system: Abdomen is nondistended, soft and nontender. No organomegaly or masses felt. Normal bowel sounds heard. Central nervous system: Alert and oriented. No focal neurological deficits. Extremities: continues to have significant pedal and scrotal edema.  Skin: No rashes, lesions or ulcers Psychiatry: Judgement and insight appear normal. Mood & affect appropriate.     Data Reviewed: I have personally reviewed following labs and imaging studies  CBC: Recent Labs  Lab 11/20/20 1128 11/22/20 0424 11/23/20 0422 11/24/20 0620  WBC 4.4 4.8 5.6 4.7  NEUTROABS  --  3.6  --   --   HGB 9.1* 8.9* 9.0* 9.5*  HCT 33.9* 32.8* 33.6* 35.0*  MCV 93.4 94.3 94.1 95.1  PLT 93* 102* 103* 94*   Basic Metabolic Panel: Recent Labs  Lab 11/20/20 1128 11/22/20 0424 11/23/20 0422 11/24/20 0620  NA 139 140 138 138  K 3.9 4.2 4.3 4.4  CL 103 101 100 99  CO2 31 30 31 31   GLUCOSE 108* 134* 137* 132*  BUN 52* 58* 64* 68*  CREATININE 2.09* 2.08* 2.28* 2.43*  CALCIUM 8.5* 8.6* 8.5* 8.7*  MG  --  2.5*  --   --   PHOS  --  4.6  --   --    GFR: Estimated Creatinine Clearance: 36 mL/min (A) (by C-G formula based on SCr of 2.43 mg/dL (H)). Liver Function Tests: Recent Labs  Lab 11/22/20 0424 11/23/20 0422  AST 19 16  ALT 16 14  ALKPHOS 79 70  BILITOT 0.9 1.0  PROT 6.8 6.4*  ALBUMIN 3.4* 3.2*   No results for input(s): LIPASE, AMYLASE in the last 168 hours. Recent Labs  Lab 11/22/20 0425  AMMONIA 35   Coagulation Profile: Recent Labs  Lab 11/22/20 0424 11/23/20 0422 11/24/20 0620  INR 2.7* 3.0* 2.4*    Cardiac Enzymes: No results for input(s): CKTOTAL, CKMB, CKMBINDEX, TROPONINI in the last 168 hours. BNP (last 3 results) No results for input(s): PROBNP in the last 8760 hours. HbA1C: No results for input(s): HGBA1C in the last 72 hours. CBG: Recent Labs  Lab 11/23/20 1120 11/23/20 1553 11/23/20 2041 11/24/20 0718 11/24/20 1150  GLUCAP 141* 129* 168* 113* 140*   Lipid Profile: No results for input(s): CHOL, HDL, LDLCALC, TRIG, CHOLHDL, LDLDIRECT in the last 72 hours. Thyroid Function Tests: No results for input(s): TSH, T4TOTAL, FREET4, T3FREE, THYROIDAB in the last 72 hours. Anemia Panel: No results for input(s): VITAMINB12, FOLATE, FERRITIN, TIBC, IRON, RETICCTPCT in the last 72 hours. Sepsis Labs: No results for input(s): PROCALCITON, LATICACIDVEN in the last 168 hours.  Recent Results (from the past 240 hour(s))  Resp Panel by RT-PCR (Flu A&B, Covid) Nasopharyngeal Swab     Status: None   Collection Time: 11/22/20  5:12 AM   Specimen: Nasopharyngeal Swab; Nasopharyngeal(NP) swabs in vial transport medium  Result Value Ref Range Status   SARS  Coronavirus 2 by RT PCR NEGATIVE NEGATIVE Final    Comment: (NOTE) SARS-CoV-2 target nucleic acids are NOT DETECTED.  The SARS-CoV-2 RNA is generally detectable in upper respiratory specimens during the acute phase of infection. The lowest concentration of SARS-CoV-2 viral copies this assay can detect is 138 copies/mL. A negative result does not preclude SARS-Cov-2 infection and should not be used as the sole basis for treatment or other patient management decisions. A negative result may occur with  improper specimen collection/handling, submission of specimen other than nasopharyngeal swab, presence of viral mutation(s) within the areas targeted by this assay, and inadequate number of viral copies(<138 copies/mL). A negative result must be combined with clinical observations, patient history, and  epidemiological information. The expected result is Negative.  Fact Sheet for Patients:  EntrepreneurPulse.com.au  Fact Sheet for Healthcare Providers:  IncredibleEmployment.be  This test is no t yet approved or cleared by the [place] FDA and  has been authorized for detection and/or diagnosis of SARS-CoV-2 by FDA under an Emergency Use Authorization (EUA). This EUA will remain  in effect (meaning this test can be used) for the duration of the COVID-19 declaration under Section 564(b)(1) of the Act, 21 U.S.C.section 360bbb-3(b)(1), unless the authorization is terminated  or revoked sooner.       Influenza A by PCR NEGATIVE NEGATIVE Final   Influenza B by PCR NEGATIVE NEGATIVE Final    Comment: (NOTE) The Xpert Xpress SARS-CoV-2/FLU/RSV plus assay is intended as an aid in the diagnosis of influenza from Nasopharyngeal swab specimens and should not be used as a sole basis for treatment. Nasal washings and aspirates are unacceptable for Xpert Xpress SARS-CoV-2/FLU/RSV testing.  Fact Sheet for Patients: EntrepreneurPulse.com.au  Fact Sheet for Healthcare Providers: IncredibleEmployment.be  This test is not yet approved or cleared by the [place] FDA and has been authorized for detection and/or diagnosis of SARS-CoV-2 by FDA under an Emergency Use Authorization (EUA). This EUA will remain in effect (meaning this test can be used) for the duration of the COVID-19 declaration under Section 564(b)(1) of the Act, 21 U.S.C. section 360bbb-3(b)(1), unless the authorization is terminated or revoked.  Performed at [facility], [address], [place], [place] [address]          Radiology Studies: No results found.      Scheduled Meds:  allopurinol  300 mg Oral Daily   atorvastatin  20 mg Oral Daily   Chlorhexidine Gluconate Cloth  6 each Topical Daily   furosemide  60 mg Intravenous BID    insulin aspart  0-5 Units Subcutaneous QHS   insulin aspart  0-9 Units Subcutaneous TID WC   insulin glargine-yfgn  16 Units Subcutaneous QHS   mouth rinse  15 mL Mouth Rinse BID   metoprolol succinate  25 mg Oral Daily   pantoprazole  40 mg Oral Daily   polyethylene glycol  17 g Oral Daily   sildenafil  20 mg Oral TID   tamsulosin  0.4 mg Oral Daily   warfarin  5 mg Oral ONCE-1600   Warfarin - Pharmacist Dosing Inpatient   Does not apply q1600   Continuous Infusions:   LOS: 2 days    Time spent: 56mns    JKathie Dike MD [facility]   If 7PM-7AM, please contact night-coverage [email or web address]  11/24/2020, 12:54 PM

## 2020-11-24 NOTE — Procedures (Signed)
PreOperative Dx: Cirrhosis due to NASH, ascites Postoperative Dx: Cirrhosis due to NASH, ascites Procedure:   US guided paracentesis Radiologist:  Thornton Papas Anesthesia:  10 ml of1% lidocaine Specimen:  2.4 L of yellow ascitic fluid EBL:   < 1 ml Complications: None

## 2020-11-24 NOTE — Progress Notes (Signed)
ANTICOAGULATION CONSULT NOTE -   Pharmacy Consult for warfarin Indication: atrial fibrillation  Allergies  Allergen Reactions   Codeine     Headache     Patient Measurements: Height: 5' 4"  (162.6 cm) Weight: 130.3 kg (287 lb 3.2 oz) IBW/kg (Calculated) : 59.2  Vital Signs: Temp: 97.6 F (36.4 C) (11/22 0530) Temp Source: Oral (11/22 0530) BP: 98/68 (11/22 1032) Pulse Rate: 102 (11/22 1032)  Labs: Recent Labs    11/22/20 0424 11/23/20 0422 11/24/20 0620  HGB 8.9* 9.0* 9.5*  HCT 32.8* 33.6* 35.0*  PLT 102* 103* 94*  APTT  --  57*  --   LABPROT 28.6* 30.8* 25.7*  INR 2.7* 3.0* 2.4*  CREATININE 2.08* 2.28* 2.43*     Estimated Creatinine Clearance: 36 mL/min (A) (by C-G formula based on SCr of 2.43 mg/dL (H)).   Medical History: Past Medical History:  Diagnosis Date   CAD (coronary artery disease)    LHC 2/08:  mRCA 50%, o/w no sig CAD, EF 25%   Cardiomyopathy ([place])    EF 35-40% in 2/16 in setting of AF with RVR >> echo 5/16 with improved LVF with EF 65-70%   Chronic atrial fibrillation (HCC)    Chronic diastolic heart failure (HCC)    HFPF - EF ~65-70% (OFTEN EXACERBATED BY AFIB)   CKD (chronic kidney disease)    hx of worsening renal failure and hyperK+ in setting of acute diast CHF >> required CVVHD in 4/16 and dialysis x 1 in 5/16   CKD stage 3 due to type 2 diabetes mellitus ([place]) 09/17/2015   Colon cancer ([place])    Colon polyps 09/21/2012   Tubular adenoma   Diabetes ([place])    Hyperlipidemia    Hypertension    Obesity hypoventilation syndrome ([place])    Renal insufficiency    Sleep apnea     Medications:  Medications Prior to Admission  Medication Sig Dispense Refill Last Dose   allopurinol (ZYLOPRIM) 300 MG tablet Take 1 tablet (300 mg total) by mouth daily. 90 tablet 3    insulin degludec (TRESIBA) 100 UNIT/ML FlexTouch Pen Inject 24 Units into the skin at bedtime.      KLOR-CON M20 20 MEQ tablet Take 0.5 tablets by mouth daily.      metoprolol  succinate (TOPROL-XL) 50 MG 24 hr tablet Take 75 mg by mouth daily. Takes 1.5 tabs TID (34m 3 times daily)      pantoprazole (PROTONIX) 40 MG tablet TAKE 1 TABLET BY MOUTH EVERY DAY 90 tablet 1    sildenafil (REVATIO) 20 MG tablet Take 1 tablet (20 mg total) by mouth 3 (three) times daily. 90 tablet 11    torsemide (DEMADEX) 20 MG tablet Take 3 tablets (60 mg total) by mouth 2 (two) times daily. 540 tablet 3    acetaminophen (TYLENOL) 325 MG tablet Take 2 tablets (650 mg total) by mouth every 6 (six) hours as needed for mild pain (or Fever >/= 101). 12 tablet 0    atorvastatin (LIPITOR) 20 MG tablet TAKE 1 TABLET BY MOUTH EVERY DAY 90 tablet 3    benzonatate (TESSALON) 100 MG capsule Take 1 capsule (100 mg total) by mouth 2 (two) times daily as needed for cough.      Dulaglutide (TRULICITY) 1.5 MZY/6.0YTSOPN Inject 1.5 mg into the skin once a week. 6 mL 3    ertugliflozin L-PyroglutamicAc (STEGLATRO) 5 MG TABS tablet Take 5 mg by mouth daily.      ferrous sulfate 325 (65  FE) MG tablet Take 325 mg by mouth daily with breakfast.       glucose blood (CONTOUR NEXT TEST) test strip Test BS TID Dx E11.29 300 strip 3    Insulin Glargine (LANTUS SOLOSTAR [place]) Inject into the skin. 16 units      insulin glargine-yfgn (SEMGLEE) 100 UNIT/ML Pen Inject 16 Units into the skin at bedtime.      Insulin Pen Needle 32G X 4 MM MISC Use to give insulin daily Dx E11.29 100 each 3    melatonin 5 MG TABS Take 5 mg by mouth at bedtime as needed.      metoprolol succinate (TOPROL-XL) 25 MG 24 hr tablet Take 1 tablet (25 mg total) by mouth daily.      OXYGEN Inhale into the lungs. Continuous      polyethylene glycol (MIRALAX / GLYCOLAX) 17 g packet Take 17 g by mouth daily. 14 each 0    warfarin (COUMADIN) 7.5 MG tablet Take 7.5 mg by mouth daily.       Assessment: Pharmacy consulted to dose warfarin in patient with atrial fibrillation.  INR on admission is 2.7 .  Patient is an unreliable historian.  Home dose listed  as 7.5 mg daily per med rec.   Patient recently admitted here in October for GI bleed and per GI recommended INR 2-2.5 INR 2.7> 3> 2.4 today, and no dose given yesterday. Diuresing patient. Will be conservative with dose, since elevated on admission Hgb 9  platelets 103  Goal of Therapy:  INR 2.0-2.5 Monitor platelets by anticoagulation protocol: Yes   Plan:  Coumadin 70m po x 1 today Monitor daily INR and s/s of bleeding.  LIsac Sarna BS PVena Austria BCaliforniaClinical Pharmacist Pager #[phone] [id number]/22/2022 11:35 AM

## 2020-11-24 NOTE — Progress Notes (Signed)
Pt refused cpap for tonight. Pt stated that he does not wear cpap at home. Pt currently on 4L [place].

## 2020-11-24 NOTE — Progress Notes (Signed)
Called and gave report to nurse Rodman Pickle at receiving hospital [facility].

## 2020-11-24 NOTE — Progress Notes (Signed)
Checked pt's BP prior to medication administration. BP 108/70, pulse 90. Advised by MD to hold sildenafil and administer lasix.

## 2020-11-24 NOTE — Progress Notes (Signed)
Patient's blood pressure 98/68. Has metoprolol, furosemide and sildenafil ordered. Messaged Dr. Roderic Palau. Hold medications currently at this time per Dr. Roderic Palau.

## 2020-11-24 NOTE — Consult Note (Addendum)
Cardiology Consultation:   Patient ID: William Flores MRN: [id number]; DOB: 1950/08/16  Admit date: 11/22/2020 Date of Consult: 11/24/2020  PCP:  [name], [name], MD   [facility] Providers Cardiologist:  Minus Breeding, MD        Patient Profile:   William Flores is a 70 y.o. male with a hx of RV failure/cor pulmonale, severe pulmonary HTN, afib who is being seen 11/24/2020 for the evaluation of SOB at the request of Dr Roderic Palau.  History of Present Illness:   William Flores 70 yo male history of chronic afib on coumadin, prior systolic HF thought tachy mediated later with recovered LVEF, chronic diastolic HF, RV failure with pulm HTN suspected group 3 due to OSA/OHS and also group II from diastolic HF on sildenafil, colon cancer with prior resection, 10/2020 admit with GI bleed  From 11/20/2020 note was volume up 37 lbs since prior appt. Toresmide was increased to 12m bid, metolazone 2.583monce daily x 2 days, then once weekly.   Admitted 11/22/20 with abdominal, testicular, and LE edema. Weight had not improved since CHF clinic visit with diuretic changes. Ongoign edmea, progressing SOB/DOE.    Admit weight 286 lbs 11/18 wt 286 lbs 8/24 wt 257 lbs  Admit labs  WBC 4.8 Hgb 8.9 Plt 102 K 4.2 Cr 2.08(baseline 1.8 to 2.2) BUN 58 INR 2.7 BNP 270 Ammonia 35 COVID/flu neg CXR cardiomegaly, mild congestion US[place]oreabd mild to moderate ascites   11/22/2020 echo: LVEF 55-60%, no WMAs, indet diastolic. Flattened septum suggesting RV pressure and volume overload, severe RV dysfunction with severe RVE, severe pulm HTN, mild to mod MR, mod TR, dilated IVC  04/2019 RHC: mean PA 53, PCWP 23, CI 2.71, PVR 5 WU  Past Medical History:  Diagnosis Date   CAD (coronary artery disease)    LHC 2/08:  mRCA 50%, o/w no sig CAD, EF 25%   Cardiomyopathy ([place]   EF 35-40% in 2/16 in setting of AF with RVR >> echo 5/16 with improved LVF with EF 65-70%   Chronic atrial fibrillation (HCC)    Chronic  diastolic heart failure (HCC)    HFPF - EF ~65-70% (OFTEN EXACERBATED BY AFIB)   CKD (chronic kidney disease)    hx of worsening renal failure and hyperK+ in setting of acute diast CHF >> required CVVHD in 4/16 and dialysis x 1 in 5/16   CKD stage 3 due to type 2 diabetes mellitus ([facility]09/14/2017   Colon cancer ([facility]   Colon polyps 09/21/2012   Tubular adenoma   Diabetes ([facility]   Hyperlipidemia    Hypertension    Obesity hypoventilation syndrome ([facility]   Renal insufficiency    Sleep apnea     Past Surgical History:  Procedure Laterality Date   CIRCUMCISION     COLON RESECTION     COLONOSCOPY N/A 09/21/2012   Procedure: COLONOSCOPY;  Surgeon: RoInda CastleMD;  [facility];  Service: Endoscopy;  Laterality: N/A;   COLONOSCOPY N/A 12/28/2018   Procedure: COLONOSCOPY;  Surgeon: ReRogene HoustonMD;  [facility];  Service: Endoscopy;  Laterality: N/A;   COLONOSCOPY WITH PROPOFOL N/A 10/17/2020   Procedure: COLONOSCOPY WITH PROPOFOL;  Surgeon: CaHarvel QualeMD;  Location[facility];  Service: Gastroenterology;  Laterality: N/A;   ENTEROSCOPY N/A 06/24/2020   Procedure: ENTEROSCOPY;  Surgeon: GeGatha MayerMD;  [facility];  Service: Endoscopy;  Laterality: N/A;   ESOPHAGOGASTRODUODENOSCOPY N/A 12/28/2018  Procedure: ESOPHAGOGASTRODUODENOSCOPY (EGD);  Surgeon: Rogene Houston, MD;  Location[facility];  Service: Endoscopy;  Laterality: N/A;   POLYPECTOMY  10/17/2020   Procedure: POLYPECTOMY;  Surgeon: Harvel Quale, MD;  Location[facility];  Service: Gastroenterology;;  rectal, transverse, descending, ascending, sigmoid   RIGHT HEART CATH N/A 04/11/2019   Procedure: RIGHT HEART CATH;  Surgeon: Larey Dresser, MD;  Location: [facility];  Service: Cardiovascular;  Laterality: N/A;   US ECHOCARDIOGRAPHY  03/04/2010   abnormal study       Inpatient Medications: Scheduled Meds:  allopurinol  300 mg  Oral Daily   atorvastatin  20 mg Oral Daily   Chlorhexidine Gluconate Cloth  6 each Topical Daily   furosemide  60 mg Intravenous BID   insulin aspart  0-5 Units Subcutaneous QHS   insulin aspart  0-9 Units Subcutaneous TID WC   insulin glargine-yfgn  16 Units Subcutaneous QHS   mouth rinse  15 mL Mouth Rinse BID   metoprolol succinate  25 mg Oral Daily   pantoprazole  40 mg Oral Daily   polyethylene glycol  17 g Oral Daily   sildenafil  20 mg Oral TID   tamsulosin  0.4 mg Oral Daily   warfarin  5 mg Oral ONCE-1600   Warfarin - Pharmacist Dosing Inpatient   Does not apply q1600   Continuous Infusions:  PRN Meds: acetaminophen, alum & mag hydroxide-simeth, guaiFENesin-dextromethorphan, melatonin  Allergies:    Allergies  Allergen Reactions   Codeine     Headache     Social History:   Social History   Socioeconomic History   Marital status: Divorced    Spouse name: Not on file   Number of children: 1   Years of education: 12   Highest education level: 12th grade  Occupational History    Employer: [employer]  Tobacco Use   Smoking status: Former    Types: Cigarettes    Quit date: 08/06/1983    Years since quitting: 37.3   Smokeless tobacco: Never  Vaping Use   Vaping Use: Never used  Substance and Sexual Activity   Alcohol use: No   Drug use: No   Sexual activity: Not Currently  Other Topics Concern   Not on file  Social History Narrative   Lives alone   Social Determinants of Health   Financial Resource Strain: Low Risk    Difficulty of Paying Living Expenses: Not very hard  Food Insecurity: Not on file  Transportation Needs: Not on file  Physical Activity: Inactive   Days of Exercise per Week: 0 days   Minutes of Exercise per Session: 0 min  Stress: Not on file  Social Connections: Socially Isolated   Frequency of Communication with Friends and Family: More than three times a week   Frequency of Social Gatherings with Friends and Family: More  than three times a week   Attends Religious Services: Never   Marine scientist or Organizations: No   Attends Music therapist: Never   Marital Status: Divorced  Human resources officer Violence: Not on file    Family History:    Family History  Problem Relation Age of Onset   Breast cancer Mother    Diabetes Mother    Heart attack Father      ROS:  Please see the history of present illness.   All other ROS reviewed and negative.     Physical Exam/Data:   Vitals:   11/23/20  2044 11/24/20 0530 11/24/20 0812 11/24/20 1032  BP: 108/72 107/68  98/68  Pulse: 94 93  (!) 102  Resp: 20 20  18   Temp: (!) 97.5 F (36.4 C) 97.6 F (36.4 C)    TempSrc: Oral Oral    SpO2: 91% 99%  91%  Weight:   130.3 kg   Height:       No intake or output data in the 24 hours ending 11/24/20 1311 Last 3 Weights 11/24/2020 11/24/2020 11/23/2020  Weight (lbs) 287 lb 3.2 oz (No Data) 285 lb 3.2 oz  Weight (kg) 130.273 kg (No Data) 129.366 kg     Body mass index is 49.3 kg/m.  General:  Well nourished, well developed, in no acute distress HEENT: normal Neck: elevated JVD Vascular: No carotid bruits; Distal pulses 2+ bilaterally Cardiac:  irreg Lungs:  clear to auscultation bilaterally, no wheezing, rhonchi or rales  Abd: soft, nontender, no hepatomegaly  Ext: 1+ bilateral LE edema Musculoskeletal:  No deformities, BUE and BLE strength normal and equal Skin: warm and dry  Neuro:  CNs 2-12 intact, no focal abnormalities noted Psych:  Normal affect     Laboratory Data:  High Sensitivity Troponin:  No results for input(s): TROPONINIHS in the last 720 hours.   Chemistry Recent Labs  Lab 11/22/20 0424 11/23/20 0422 11/24/20 0620  NA 140 138 138  K 4.2 4.3 4.4  CL 101 100 99  CO2 30 31 31   GLUCOSE 134* 137* 132*  BUN 58* 64* 68*  CREATININE 2.08* 2.28* 2.43*  CALCIUM 8.6* 8.5* 8.7*  MG 2.5*  --   --   GFRNONAA 34* 30* 28*  ANIONGAP 9 7 8     Recent Labs  Lab  11/22/20 0424 11/23/20 0422  PROT 6.8 6.4*  ALBUMIN 3.4* 3.2*  AST 19 16  ALT 16 14  ALKPHOS 79 70  BILITOT 0.9 1.0   Lipids No results for input(s): CHOL, TRIG, HDL, LABVLDL, LDLCALC, CHOLHDL in the last 168 hours.  Hematology Recent Labs  Lab 11/22/20 0424 11/23/20 0422 11/24/20 0620  WBC 4.8 5.6 4.7  RBC 3.48* 3.57* 3.68*  HGB 8.9* 9.0* 9.5*  HCT 32.8* 33.6* 35.0*  MCV 94.3 94.1 95.1  MCH 25.6* 25.2* 25.8*  MCHC 27.1* 26.8* 27.1*  RDW 19.9* 19.8* 19.7*  PLT 102* 103* 94*   Thyroid No results for input(s): TSH, FREET4 in the last 168 hours.  BNP Recent Labs  Lab 11/20/20 1128 11/22/20 0425  BNP 306.1* 270.0*    DDimer No results for input(s): DDIMER in the last 168 hours.   Radiology/Studies:  [facility] Chest Port 1 View  Result Date: 11/22/2020 CLINICAL DATA:  swelling and shortness of breath. EXAM: PORTABLE CHEST 1 VIEW COMPARISON:  Portable chest 10/15/2020. FINDINGS: The heart is enlarged. Mild perihilar vascular congestion continues to be seen with mild lung base interstitial edema and small pleural effusions. No focal pneumonia is seen. Similar findings were noted previously. Osteopenia. IMPRESSION: Cardiomegaly with mild perihilar vascular congestion and basilar interstitial edema with small pleural effusions. The overall aeration seems unchanged. Electronically Signed   By: Telford Nab M.D.   On: 11/22/2020 04:01   ECHOCARDIOGRAM COMPLETE  Result Date: 11/22/2020    ECHOCARDIOGRAM REPORT   Patient Name:   William Flores Date of Exam: 11/22/2020 Medical Rec #:  [id number]      Height:       64.0 in Accession #:    [id number]     Weight:  287.0 lb Date of Birth:  1950-10-22      BSA:          2.281 m Patient Age:    50 years       BP:           136/88 mmHg Patient Gender: M              HR:           61 bpm. Exam Location:  [facility] Procedure: 2D Echo, Cardiac Doppler, Color Doppler and Intracardiac            Opacification Agent Indications:    I50.40*  Unspecified combined systolic (congestive) and diastolic                 (congestive) heart failure  History:        Patient has prior history of Echocardiogram examinations, most                 recent 10/06/2020. CHF and Cardiomyopathy, Abnormal ECG,                 Pulmonary HTN, Arrythmias:Atrial Fibrillation; Risk                 Factors:Diabetes, Hypertension and Dyslipidemia. RV failure.  Sonographer:    Roseanna Rainbow RDCS Referring Phys: [id number] OLADAPO ADEFESO  Sonographer Comments: Technically difficult study due to poor echo windows and patient is morbidly obese. Patient moving, grunting and coughing throughout exam. Patient having back pain. Study interrupted by consult. IMPRESSIONS  1. Left ventricular ejection fraction, by estimation, is 55 to 60%. The left ventricle has normal function. The left ventricle has no regional wall motion abnormalities. The left ventricular internal cavity size was moderately dilated. There is mild left ventricular hypertrophy. Left ventricular diastolic parameters are indeterminate. There is the interventricular septum is flattened in systole and diastole, consistent with right ventricular pressure and volume overload.  2. Right ventricular systolic function is severely reduced. The right ventricular size is severely enlarged. There is severely elevated pulmonary artery systolic pressure.  3. Left atrial size was moderately dilated.  4. Right atrial size was severely dilated.  5. The mitral valve is abnormal. Mild to moderate mitral valve regurgitation. No evidence of mitral stenosis. Moderate mitral annular calcification.  6. Tricuspid valve regurgitation is moderate.  7. The aortic valve is calcified. Aortic valve regurgitation is not visualized. No aortic stenosis is present.  8. The inferior vena cava is dilated in size with <50% respiratory variability, suggesting right atrial pressure of 15 mmHg. FINDINGS  Left Ventricle: Left ventricular ejection fraction, by estimation,  is 55 to 60%. The left ventricle has normal function. The left ventricle has no regional wall motion abnormalities. Definity contrast agent was given IV to delineate the left ventricular  endocardial borders. The left ventricular internal cavity size was moderately dilated. There is mild left ventricular hypertrophy. The interventricular septum is flattened in systole and diastole, consistent with right ventricular pressure and volume overload. Left ventricular diastolic parameters are indeterminate. Right Ventricle: The right ventricular size is severely enlarged. Right ventricular systolic function is severely reduced. There is severely elevated pulmonary artery systolic pressure. The tricuspid regurgitant velocity is 4.02 m/s, and with an assumed right atrial pressure of 15 mmHg, the estimated right ventricular systolic pressure is 45.4 mmHg. Left Atrium: Left atrial size was moderately dilated. Right Atrium: Right atrial size was severely dilated. Pericardium: Trivial pericardial effusion is present. Mitral Valve: The mitral valve is  abnormal. Moderate mitral annular calcification. Mild to moderate mitral valve regurgitation. No evidence of mitral valve stenosis. Tricuspid Valve: The tricuspid valve is normal in structure. Tricuspid valve regurgitation is moderate . No evidence of tricuspid stenosis. Aortic Valve: The aortic valve is calcified. Aortic valve regurgitation is not visualized. No aortic stenosis is present. Pulmonic Valve: The pulmonic valve was normal in structure. Pulmonic valve regurgitation is not visualized. No evidence of pulmonic stenosis. Aorta: The aortic root is normal in size and structure. Venous: The inferior vena cava is dilated in size with less than 50% respiratory variability, suggesting right atrial pressure of 15 mmHg. IAS/Shunts: No atrial level shunt detected by color flow Doppler.  MR Peak grad:   104.4 mmHg   TRICUSPID VALVE MR Mean grad:   70.0 mmHg    TR Peak grad:   64.6  mmHg MR Vmax:        511.00 cm/s  TR Vmax:        402.00 cm/s MR Vmean:       394.0 cm/s MR PISA:        0.25 cm MR PISA Radius: 0.20 cm Kirk Ruths MD Electronically signed by Kirk Ruths MD Signature Date/Time: 11/22/2020/11:35:35 AM    Final    [place] ASCITES (ABDOMEN LIMITED)  Result Date: 11/22/2020 CLINICAL DATA:  Surgeon for ascites. EXAM: LIMITED ABDOMEN ULTRASOUND FOR ASCITES TECHNIQUE: Limited ultrasound survey for ascites was performed in all four abdominal quadrants. COMPARISON:  None. FINDINGS: There is mild free fluid in the right upper quadrant. There is moderate free fluid in the right lower quadrant, left lower quadrant and left upper quadrant. IMPRESSION: Mild to moderate 4 quadrant ascites. Electronically Signed   By: Audie Pinto M.D.   On: 11/22/2020 10:12     Assessment and Plan:   1.Acute on chronic RV failure/Cor pulmonale/Pulmonary HTN/ Diastolic HF - followed in CHF clinic, visit earlier this month was up over 30 lbs. Edema worsened despite diuretic adjustements, presented to ER - echo with normal LVEF, severe RV dysfunction, severe pulm HTN - long history RV failure/pulm HTN, though secondary to OSA/OHS and also diastolic LV failure - negative 5L since admission according to charting, has been on IV lasix 36m bid. Uptrend in Cr to 2.4, baseline 1.8 to 2.2. SBP high 90s to low 100s. Primary team held diuretic.  - he had good urine output on IV lasix 625mbid, will have to tolerate high Cr for now in order to diurese. Going for paracentesis, with anticipated fluid removal from procedure will dose IV lasix 6019must once today.  - he is on sildenafil for pulm HTN   - [facility] to have advanced HF evaluate patient given advanced RV failure.  - patient discussed with Dr McLAundra DubinF will see as consult tomorrow.   2. Afib - continue coumadin, toprol for rate control - rates 90s to 100s. With AKI would avoid digoxin,  with RV failure would not titrate beta blocker at this time.      For questions or updates, please contact [facility] consult [email or web address] for contact info under    Signed, BraCarlyle DollyD  11/24/2020 1:12 PM

## 2020-11-25 ENCOUNTER — Inpatient Hospital Stay (HOSPITAL_COMMUNITY): Payer: Medicare Other

## 2020-11-25 ENCOUNTER — Ambulatory Visit (HOSPITAL_COMMUNITY): Admission: RE | Admit: 2020-11-25 | Payer: Medicare Other | Source: Ambulatory Visit | Admitting: Gastroenterology

## 2020-11-25 ENCOUNTER — Inpatient Hospital Stay: Payer: Self-pay

## 2020-11-25 DIAGNOSIS — I5033 Acute on chronic diastolic (congestive) heart failure: Secondary | ICD-10-CM | POA: Diagnosis not present

## 2020-11-25 DIAGNOSIS — I50810 Right heart failure, unspecified: Secondary | ICD-10-CM | POA: Diagnosis not present

## 2020-11-25 DIAGNOSIS — I272 Pulmonary hypertension, unspecified: Secondary | ICD-10-CM | POA: Diagnosis not present

## 2020-11-25 DIAGNOSIS — L8990 Pressure ulcer of unspecified site, unspecified stage: Secondary | ICD-10-CM | POA: Insufficient documentation

## 2020-11-25 LAB — BASIC METABOLIC PANEL
Anion gap: 9 (ref 5–15)
BUN: 71 mg/dL — ABNORMAL HIGH (ref 8–23)
CO2: 30 mmol/L (ref 22–32)
Calcium: 8.6 mg/dL — ABNORMAL LOW (ref 8.9–10.3)
Chloride: 97 mmol/L — ABNORMAL LOW (ref 98–111)
Creatinine, Ser: 2.48 mg/dL — ABNORMAL HIGH (ref 0.61–1.24)
GFR, Estimated: 28 mL/min — ABNORMAL LOW (ref >60)
Glucose, Bld: 171 mg/dL — ABNORMAL HIGH (ref 70–99)
Potassium: 4.5 mmol/L (ref 3.5–5.1)
Sodium: 136 mmol/L (ref 135–145)

## 2020-11-25 LAB — CBC
HCT: 31.5 % — ABNORMAL LOW (ref 39.0–52.0)
Hemoglobin: 8.6 g/dL — ABNORMAL LOW (ref 13.0–17.0)
MCH: 25.4 pg — ABNORMAL LOW (ref 26.0–34.0)
MCHC: 27.3 g/dL — ABNORMAL LOW (ref 30.0–36.0)
MCV: 93.2 fL (ref 80.0–100.0)
Platelets: 91 10*3/uL — ABNORMAL LOW (ref 150–400)
RBC: 3.38 MIL/uL — ABNORMAL LOW (ref 4.22–5.81)
RDW: 19.4 % — ABNORMAL HIGH (ref 11.5–15.5)
WBC: 4.2 10*3/uL (ref 4.0–10.5)
nRBC: 0 % (ref 0.0–0.2)

## 2020-11-25 LAB — COOXEMETRY PANEL
Carboxyhemoglobin: 1.7 % — ABNORMAL HIGH (ref 0.5–1.5)
Methemoglobin: 0.9 % (ref 0.0–1.5)
O2 Saturation: 56.4 %
Total hemoglobin: 9.2 g/dL — ABNORMAL LOW (ref 12.0–16.0)

## 2020-11-25 LAB — PROTIME-INR
INR: 2.5 — ABNORMAL HIGH (ref 0.8–1.2)
Prothrombin Time: 26.9 seconds — ABNORMAL HIGH (ref 11.4–15.2)

## 2020-11-25 LAB — GLUCOSE, CAPILLARY
Glucose-Capillary: 132 mg/dL — ABNORMAL HIGH (ref 70–99)
Glucose-Capillary: 142 mg/dL — ABNORMAL HIGH (ref 70–99)
Glucose-Capillary: 195 mg/dL — ABNORMAL HIGH (ref 70–99)
Glucose-Capillary: 157 mg/dL — ABNORMAL HIGH (ref 70–99)
Glucose-Capillary: 169 mg/dL — ABNORMAL HIGH (ref 70–99)

## 2020-11-25 LAB — AMMONIA: Ammonia: 48 umol/L — ABNORMAL HIGH (ref 9–35)

## 2020-11-25 SURGERY — IMAGING PROCEDURE, GI TRACT, INTRALUMINAL, VIA CAPSULE

## 2020-11-25 MED ORDER — ACETAZOLAMIDE 250 MG PO TABS
250.0000 mg | ORAL_TABLET | Freq: Two times a day (BID) | ORAL | Status: AC
  Administered 2020-11-25 (×2): 250 mg via ORAL
  Filled 2020-11-25 (×3): qty 1

## 2020-11-25 MED ORDER — FUROSEMIDE 10 MG/ML IJ SOLN
20.0000 mg/h | INTRAVENOUS | Status: DC
  Administered 2020-11-25 (×2): 15 mg/h via INTRAVENOUS
  Administered 2020-11-27: 20 mg/h via INTRAVENOUS
  Filled 2020-11-25 (×5): qty 20

## 2020-11-25 MED ORDER — LACTULOSE 10 GM/15ML PO SOLN
20.0000 g | Freq: Two times a day (BID) | ORAL | Status: DC
  Administered 2020-11-25 – 2020-12-11 (×28): 20 g via ORAL
  Filled 2020-11-25 (×32): qty 30

## 2020-11-25 MED ORDER — FUROSEMIDE 10 MG/ML IJ SOLN
80.0000 mg | Freq: Once | INTRAMUSCULAR | Status: AC
  Administered 2020-11-25: 80 mg via INTRAVENOUS
  Filled 2020-11-25: qty 8

## 2020-11-25 MED ORDER — WARFARIN SODIUM 3 MG PO TABS
3.0000 mg | ORAL_TABLET | Freq: Once | ORAL | Status: AC
  Administered 2020-11-25: 3 mg via ORAL
  Filled 2020-11-25: qty 1

## 2020-11-25 MED ORDER — HYDROCORTISONE 1 % EX CREA
TOPICAL_CREAM | Freq: Three times a day (TID) | CUTANEOUS | Status: DC | PRN
  Administered 2020-11-25: 1 via TOPICAL
  Filled 2020-11-25 (×5): qty 28

## 2020-11-25 MED ORDER — MILRINONE LACTATE IN DEXTROSE 20-5 MG/100ML-% IV SOLN
0.1250 ug/kg/min | INTRAVENOUS | Status: DC
  Administered 2020-11-25 – 2020-11-28 (×5): 0.125 ug/kg/min via INTRAVENOUS
  Filled 2020-11-25 (×6): qty 100

## 2020-11-25 NOTE — NC FL2 (Signed)
[place] LEVEL OF CARE SCREENING TOOL     IDENTIFICATION  Patient Name: William Flores Birthdate: 1950/12/18 Sex: male Admission Date (Current Location): 11/22/2020  [facility] and [place] Number:  Herbalist and Address:  [facility], [place] [address], [place], [place] [address]      Provider Number: [id number]  Attending Physician Name and Address:  Domenic Polite, MD  Relative Name and Phone Number:       Current Level of Care: Hospital Recommended Level of Care: [place] Prior Approval Number:    Date Approved/Denied:   PASRR Number: [id number]  Discharge Plan: SNF    Current Diagnoses: Patient Active Problem List   Diagnosis Date Noted   Pressure injury of skin 11/25/2020   Acute exacerbation of CHF (congestive heart failure) ([place]) 11/22/2020   Elevated brain natriuretic peptide (BNP) level 11/22/2020   Thrombocytopenia ([place]) 11/22/2020   GERD (gastroesophageal reflux disease) 11/22/2020   Insomnia 11/22/2020   Ascites 11/12/2020   Acute blood loss anemia    Acquired thrombophilia ([place]) 10/14/2020   Liver cirrhosis secondary to [place] ([place]) 10/14/2020   Abdominal distension    Iron deficiency anemia 07/21/2020   AKI (acute kidney injury) ([place])    Syncope and collapse    Syncope 06/19/2020   Hypokalemia 06/19/2020   Fecal occult blood test positive 06/19/2020   Cor pulmonale, acute ([place]) 04/15/2019   Bilateral leg edema    Chronic renal failure    Acute on chronic congestive heart failure ([place]) 03/29/2019   Pulmonary hypertension ([place]) 12/28/2018   Mixed hyperlipidemia 05/18/2017   Morbid obesity with BMI of 45.0-49.9, adult ([place]) 05/18/2017   CKD (chronic kidney disease), stage III ([employer]) 09/17/2015   MGUS (monoclonal gammopathy of unknown significance) 05/27/2015   Normocytic anemia 11/04/2014   Peripheral edema    Essential hypertension    Chronic respiratory failure with hypoxia (HCC)     DM (diabetes mellitus), type 2 with renal complications (HCC) 95/28/4132   Atrial fibrillation with RVR ([place]) 04/14/2014   Anticoagulated on Coumadin 03/20/2014   Nonischemic cardiomyopathy ([place]) 02/20/2014   OSA (obstructive sleep apnea)    Right heart failure ([place]) 02/10/2014   Morbid obesity ([place]) 08/05/2013   History of colon cancer    Gout 05/25/2012   Hypertensive heart disease     Orientation RESPIRATION BLADDER Height & Weight     Self, Time, Situation, Place  O2 (HFNC 4L) Continent, External catheter Weight: 280 lb 4.8 oz (127.1 kg) Height:  5' 7"  (170.2 cm)  BEHAVIORAL SYMPTOMS/MOOD NEUROLOGICAL BOWEL NUTRITION STATUS      Continent Diet (Please See DC Summary)  AMBULATORY STATUS COMMUNICATION OF NEEDS Skin   Limited Assist Verbally Skin abrasions, Other (Comment) (pressure injury)                       Personal Care Assistance Level of Assistance  Bathing, Feeding, Dressing Bathing Assistance: Limited assistance Feeding assistance: Independent Dressing Assistance: Limited assistance     Functional Limitations Info  Sight, Hearing, Speech Sight Info: Impaired Hearing Info: Adequate Speech Info: Adequate    SPECIAL CARE FACTORS FREQUENCY  PT (By licensed PT), OT (By licensed OT)     PT Frequency: 5x/week OT Frequency: 5x/week            Contractures Contractures Info: Not present    Additional Factors Info  Code Status, Allergies, Insulin Sliding Scale Code Status Info: Full Allergies Info: Codeine  Insulin Sliding Scale Info: Please See Medication List       Current Medications (11/25/2020):  This is the current hospital active medication list Current Facility-Administered Medications  Medication Dose Route Frequency Provider Last Rate Last Admin   acetaminophen (TYLENOL) tablet 650 mg  650 mg Oral Q6H PRN Kathie Dike, MD   650 mg at 11/22/20 1944   acetaZOLAMIDE (DIAMOX) tablet 250 mg  250 mg Oral BID Clegg, Amy D, NP   250 mg at  11/25/20 1226   allopurinol (ZYLOPRIM) tablet 300 mg  300 mg Oral Daily Kathie Dike, MD   300 mg at 11/25/20 0956   alum & mag hydroxide-simeth (MAALOX/MYLANTA) 200-200-20 MG/5ML suspension 30 mL  30 mL Oral Q4H PRN Adefeso, Oladapo, DO   30 mL at 11/23/20 2002   atorvastatin (LIPITOR) tablet 20 mg  20 mg Oral Daily Adefeso, Oladapo, DO   20 mg at 11/25/20 1749   Chlorhexidine Gluconate Cloth 2 % PADS 6 each  6 each Topical Daily Kathie Dike, MD   6 each at 11/25/20 0957   furosemide (LASIX) 200 mg in dextrose 5 % 100 mL (2 mg/mL) infusion  15 mg/hr Intravenous Continuous Clegg, Amy D, NP 7.5 mL/hr at 11/25/20 1347 15 mg/hr at 11/25/20 1347   guaiFENesin-dextromethorphan (ROBITUSSIN DM) 100-10 MG/5ML syrup 5 mL  5 mL Oral Q4H PRN Adefeso, Oladapo, DO   5 mL at 11/25/20 0957   hydrocortisone cream 1 %   Topical TID PRN Bernadette Hoit, DO   1 application at 44/96/75 0422   insulin aspart (novoLOG) injection 0-5 Units  0-5 Units Subcutaneous QHS Adefeso, Oladapo, DO       insulin aspart (novoLOG) injection 0-9 Units  0-9 Units Subcutaneous TID WC Adefeso, Oladapo, DO   2 Units at 11/25/20 1616   insulin glargine-yfgn (SEMGLEE) injection 16 Units  16 Units Subcutaneous QHS Kathie Dike, MD   16 Units at 11/23/20 2104   lactulose (CHRONULAC) 10 GM/15ML solution 20 g  20 g Oral BID Domenic Polite, MD   20 g at 11/25/20 1224   MEDLINE mouth rinse  15 mL Mouth Rinse BID Kathie Dike, MD   15 mL at 11/25/20 0959   melatonin tablet 3 mg  3 mg Oral QHS PRN Kathie Dike, MD   3 mg at 11/24/20 0004   metoprolol succinate (TOPROL-XL) 24 hr tablet 25 mg  25 mg Oral Daily Kathie Dike, MD   25 mg at 11/25/20 0956   milrinone (PRIMACOR) 20 MG/100 ML (0.2 mg/mL) infusion  0.125 mcg/kg/min Intravenous Continuous Clegg, Amy D, NP 4.77 mL/hr at 11/25/20 1223 0.125 mcg/kg/min at 11/25/20 1223   pantoprazole (PROTONIX) EC tablet 40 mg  40 mg Oral Daily Adefeso, Oladapo, DO   40 mg at 11/25/20 0956    polyethylene glycol (MIRALAX / GLYCOLAX) packet 17 g  17 g Oral Daily Kathie Dike, MD   17 g at 11/25/20 0957   sildenafil (REVATIO) tablet 20 mg  20 mg Oral TID Adefeso, Oladapo, DO   20 mg at 11/25/20 1613   tamsulosin (FLOMAX) capsule 0.4 mg  0.4 mg Oral Daily Kathie Dike, MD   0.4 mg at 11/25/20 9163   Warfarin - Pharmacist Dosing Inpatient   Does not apply q1600 Kathie Dike, MD         Discharge Medications: Please see discharge summary for a list of discharge medications.  Relevant Imaging Results:  Relevant Lab Results:   Additional Information SSN#: [id number] [place] COVID-19 Vaccine 02/05/2020 ,  08/22/2019 , 08/01/2019  Taunia Frasco, LCSWA

## 2020-11-25 NOTE — Progress Notes (Signed)
Peripherally Inserted Central Catheter Placement  The IV Nurse has discussed with the patient and/or persons authorized to consent for the patient, the purpose of this procedure and the potential benefits and risks involved with this procedure.  The benefits include less needle sticks, lab draws from the catheter, and the patient may be discharged home with the catheter. Risks include, but not limited to, infection, bleeding, blood clot (thrombus formation), and puncture of an artery; nerve damage and irregular heartbeat and possibility to perform a PICC exchange if needed/ordered by physician.  Alternatives to this procedure were also discussed.  Bard Power PICC patient education guide, fact sheet on infection prevention and patient information card has been provided to patient /or left at bedside.    PICC Placement Documentation  PICC Triple Lumen 11/25/20 PICC Right Cephalic 43 cm 0 cm (Active)  Indication for Insertion or Continuance of Line Vasoactive infusions;Chronic illness with exacerbations (CF, Sickle Cell, etc.) 11/25/20 2115  Exposed Catheter (cm) 0 cm 11/25/20 2115  Site Assessment Clean;Dry;Intact 11/25/20 2115  Lumen #1 Status Blood return noted;Flushed;Saline locked 11/25/20 2115  Lumen #2 Status Blood return noted;Flushed;Saline locked 11/25/20 2115  Lumen #3 Status Blood return noted;Flushed;Saline locked 11/25/20 2115  Dressing Type Transparent;Securing device 11/25/20 2115  Dressing Status Clean;Dry;Intact 11/25/20 2115  Antimicrobial disc in place? Yes 11/25/20 2115  Safety Lock Not Applicable 88/41/66 0630  Line Care Connections checked and tightened 11/25/20 2115  Line Adjustment (NICU/IV Team Only) No 11/25/20 2115  Dressing Intervention New dressing 11/25/20 2115  Dressing Change Due 12/02/20 11/25/20 2115       Edson Snowball 11/25/2020, 9:32 PM

## 2020-11-25 NOTE — Progress Notes (Signed)
ANTICOAGULATION CONSULT NOTE  Pharmacy Consult for warfarin Indication: atrial fibrillation  Allergies  Allergen Reactions   Codeine     Headache     Patient Measurements: Height: 5' 7"  (170.2 cm) Weight: 127.1 kg (280 lb 4.8 oz) IBW/kg (Calculated) : 66.1  Vital Signs: Temp: 97.6 F (36.4 C) (11/23 0756) Temp Source: Oral (11/23 0756) BP: 108/64 (11/23 0756) Pulse Rate: 97 (11/23 0756)  Labs: Recent Labs    11/23/20 0422 11/24/20 0620 11/25/20 0255  HGB 9.0* 9.5* 8.6*  HCT 33.6* 35.0* 31.5*  PLT 103* 94* 91*  APTT 57*  --   --   LABPROT 30.8* 25.7* 26.9*  INR 3.0* 2.4* 2.5*  CREATININE 2.28* 2.43* 2.48*     Estimated Creatinine Clearance: 36.5 mL/min (A) (by C-G formula based on SCr of 2.48 mg/dL (H)).   Medical History: Past Medical History:  Diagnosis Date   CAD (coronary artery disease)    LHC 2/08:  mRCA 50%, o/w no sig CAD, EF 25%   Cardiomyopathy ([place])    EF 35-40% in 2/16 in setting of AF with RVR >> echo 5/16 with improved LVF with EF 65-70%   Chronic atrial fibrillation (HCC)    Chronic diastolic heart failure (HCC)    HFPF - EF ~65-70% (OFTEN EXACERBATED BY AFIB)   CKD (chronic kidney disease)    hx of worsening renal failure and hyperK+ in setting of acute diast CHF >> required CVVHD in 4/16 and dialysis x 1 in 5/16   CKD stage 3 due to type 2 diabetes mellitus ([place]) 09/17/2015   Colon cancer ([place])    Colon polyps 09/21/2012   Tubular adenoma   Diabetes ([place])    Hyperlipidemia    Hypertension    Obesity hypoventilation syndrome ([place])    Renal insufficiency    Sleep apnea     Medications:  Medications Prior to Admission  Medication Sig Dispense Refill Last Dose   allopurinol (ZYLOPRIM) 300 MG tablet Take 1 tablet (300 mg total) by mouth daily. 90 tablet 3    insulin degludec (TRESIBA) 100 UNIT/ML FlexTouch Pen Inject 24 Units into the skin at bedtime.      KLOR-CON M20 20 MEQ tablet Take 0.5 tablets by mouth daily.      metoprolol  succinate (TOPROL-XL) 50 MG 24 hr tablet Take 75 mg by mouth daily. Takes 1.5 tabs TID (22m 3 times daily)      pantoprazole (PROTONIX) 40 MG tablet TAKE 1 TABLET BY MOUTH EVERY DAY 90 tablet 1    sildenafil (REVATIO) 20 MG tablet Take 1 tablet (20 mg total) by mouth 3 (three) times daily. 90 tablet 11    torsemide (DEMADEX) 20 MG tablet Take 3 tablets (60 mg total) by mouth 2 (two) times daily. 540 tablet 3    acetaminophen (TYLENOL) 325 MG tablet Take 2 tablets (650 mg total) by mouth every 6 (six) hours as needed for mild pain (or Fever >/= 101). 12 tablet 0    atorvastatin (LIPITOR) 20 MG tablet TAKE 1 TABLET BY MOUTH EVERY DAY 90 tablet 3    benzonatate (TESSALON) 100 MG capsule Take 1 capsule (100 mg total) by mouth 2 (two) times daily as needed for cough.      Dulaglutide (TRULICITY) 1.5 MKX/3.8HWSOPN Inject 1.5 mg into the skin once a week. 6 mL 3    ertugliflozin L-PyroglutamicAc (STEGLATRO) 5 MG TABS tablet Take 5 mg by mouth daily.      ferrous sulfate 325 (65 FE) MG  tablet Take 325 mg by mouth daily with breakfast.       glucose blood (CONTOUR NEXT TEST) test strip Test BS TID Dx E11.29 300 strip 3    Insulin Glargine (LANTUS SOLOSTAR [place]) Inject into the skin. 16 units      insulin glargine-yfgn (SEMGLEE) 100 UNIT/ML Pen Inject 16 Units into the skin at bedtime.      Insulin Pen Needle 32G X 4 MM MISC Use to give insulin daily Dx E11.29 100 each 3    melatonin 5 MG TABS Take 5 mg by mouth at bedtime as needed.      metoprolol succinate (TOPROL-XL) 25 MG 24 hr tablet Take 1 tablet (25 mg total) by mouth daily.      OXYGEN Inhale into the lungs. Continuous      polyethylene glycol (MIRALAX / GLYCOLAX) 17 g packet Take 17 g by mouth daily. 14 each 0    warfarin (COUMADIN) 7.5 MG tablet Take 7.5 mg by mouth daily.       Assessment: Pharmacy consulted to dose warfarin in patient with atrial fibrillation.  INR on admission is 2.7.  Patient is an unreliable historian.  Home dose listed as  7.5 mg daily per med rec.    Patient recently admitted here in October for GI bleed and per GI recommended INR 2-2.5.  INR came back therapeutic at 2.5 (up from 2.4 yesterday after warfarin restart). Hgb 8.6, plt 91. No s/sx of bleeding.   Goal of Therapy:  INR 2-2.5 Monitor platelets by anticoagulation protocol: Yes   Plan:  Coumadin 3 mg po x 1 today Monitor daily INR and s/s of bleeding.  Antonietta Jewel, PharmD, [place] Clinical Pharmacist  Phone: [phone] 11/25/2020 10:51 AM  Please check AMION for all [place] phone numbers After 10:00 PM, call [place] [phone]

## 2020-11-25 NOTE — Progress Notes (Signed)
Orthopedic Tech Progress Note Patient Details:  HJALMER IOVINO [birth year] [id number]  Ortho Devices Type of Ortho Device: Haematologist Ortho Device/Splint Location: BLE Ortho Device/Splint Interventions: Ordered, Application   Post Interventions Patient Tolerated: Well Instructions Provided: Care of device  Janit Pagan 11/25/2020, 12:06 PM

## 2020-11-25 NOTE — Progress Notes (Addendum)
HF CSW reached out to [place] at [facility], [place] and she will check and let the CSW know if not maybe [facility] [place].  CSW spoke with William Flores at bedside and he reported that he is from [facility] in [place] and that he has been there for a week or so and he is okay to return back to [facility] upon discharge. Awaiting to hear back from [place] at [facility] to verify they can take William Flores back at time of discharge and see if she will need an FL2 completed. Debbie confirmed that William Flores can return at time of discharge and she will need an FL2 completed.  Jeet Shough, MSW, [place] [facility] Failure Social Worker

## 2020-11-25 NOTE — Progress Notes (Signed)
PROGRESS NOTE    William Flores  JGG:[id number] DOB: 1950-12-08 DOA: 11/22/2020 PCP: [name], [name], MD    Brief Narrative:  70 year old male with a history of atrial fibrillation on anticoagulation, chronic kidney disease stage IIIb, chronic right-sided heart failure, pulmonary hypertension, cirrhosis, who is currently at [facility] skilled nursing facility.  He was admitted to the hospital with increasing lower extremity edema and scrotal swelling.   -Weight was up 30 pounds from August, chest x-ray noted vascular congestion, ultrasound noted moderate ascites, echo with EF of 55-60%, severe RV dysfunction and dilated IVC -11/22 had paracentesis, 2.4 L removed  Assessment & Plan:   Acute on chronic right-sided heart failure, cor pulmonale Severe pulmonary hypertension -Worsening fluid overload despite recent increase in diuretics -Remains volume overload diet, he is 4.3 L negative-urine output and 2.4 L via paracentesis -Cardiology consulting, plan to start Lasix drip, milrinone for severe RV dysfunction, cardiorenal syndrome and acetazolamide -Remains on sildenafil for severe PAH, continue home O2 and CPAP -Monitor urine output closely   Acute on chronic respiratory failure with hypoxia -Chronically on 2 L of oxygen, currently requiring 4L -As above   Cirrhosis secondary to NASH -Abdominal ultrasound showed mild to mod ascites -since creatinine trending up with diuretics, will request paracentesis -LFTs normal range  ?  Encephalopathy -Drowsy this morning with asterixis on exam, check ammonia level -Start lactulose if elevated   Chronic atrial fibrillation -Heart rate is currently stable -Continue on outpatient dose of Toprol -He is anticoagulated with Coumadin, if platelet count continues to trend down may need to discontinue Coumadin in the future    AKI on Chronic kidney disease stage IIIb -Baseline creatinine appears to be around 2.0 -Creatinine appears to be  trending up with diuresis -Continue to trend, plan to start milrinone today   Thrombocytopenia -Related to liver disease -Continue to follow   Diabetes mellitus -Continued on basal insulin -Follow blood sugars with sliding scale -Blood sugars currently stable   Obstructive sleep apnea -Continue on CPAP   Morbid obesity -BMI 49.2 -Increased risk of mobility/mortality   GERD -Continue Protonix  DVT prophylaxis: SCDs Start: 11/22/20 0738 warfarin (COUMADIN) tablet 3 mg  Code Status: Full code Family Communication: discussed with patient in detail, no family at bedside Disposition Plan: Status is: Inpatient  Remains inpatient appropriate because: continued IV diuresis, severity of illness     Consultants:  Cardiology  Procedures:  Echo: LVEF 54-65%, RV systolic function severely reduced  Antimicrobials:      Subjective: -Remains drowsy this morning  Objective: Vitals:   11/25/20 0000 11/25/20 0405 11/25/20 0756 11/25/20 1117  BP:  115/64 108/64 117/69  Pulse:  93 97 (!) 102  Resp:  17 20 20   Temp:  (!) 97.5 F (36.4 C) 97.6 F (36.4 C) 98 F (36.7 C)  TempSrc:  Oral Oral Oral  SpO2:  95% 93% 92%  Weight: 127.1 kg     Height: 5' 7"  (1.702 m)       Intake/Output Summary (Last 24 hours) at 11/25/2020 1119 Last data filed at 11/25/2020 1011 Gross per 24 hour  Intake 1200 ml  Output 750 ml  Net 450 ml   Filed Weights   11/23/20 0554 11/24/20 0812 11/25/20 0000  Weight: 129.4 kg 130.3 kg 127.1 kg    Examination:  General exam: Obese chronically ill male sitting up in bed, somnolent but arousable, oriented x2 CVS: S1-S2, regular rate rhythm Lungs: Decreased breath sounds to bases otherwise clear Abdomen: Soft, nontender, bowel  sounds present, fluid thrill noted Extremities: No edema Skin: No rash on exposed skin Psych: Flat affect  Data Reviewed: I have personally reviewed following labs and imaging studies  CBC: Recent Labs  Lab  11/20/20 1128 11/22/20 0424 11/23/20 0422 11/24/20 0620 11/25/20 0255  WBC 4.4 4.8 5.6 4.7 4.2  NEUTROABS  --  3.6  --   --   --   HGB 9.1* 8.9* 9.0* 9.5* 8.6*  HCT 33.9* 32.8* 33.6* 35.0* 31.5*  MCV 93.4 94.3 94.1 95.1 93.2  PLT 93* 102* 103* 94* 91*   Basic Metabolic Panel: Recent Labs  Lab 11/20/20 1128 11/22/20 0424 11/23/20 0422 11/24/20 0620 11/25/20 0255  NA 139 140 138 138 136  K 3.9 4.2 4.3 4.4 4.5  CL 103 101 100 99 97*  CO2 31 30 31 31 30   GLUCOSE 108* 134* 137* 132* 171*  BUN 52* 58* 64* 68* 71*  CREATININE 2.09* 2.08* 2.28* 2.43* 2.48*  CALCIUM 8.5* 8.6* 8.5* 8.7* 8.6*  MG  --  2.5*  --   --   --   PHOS  --  4.6  --   --   --    GFR: Estimated Creatinine Clearance: 36.5 mL/min (A) (by C-G formula based on SCr of 2.48 mg/dL (H)). Liver Function Tests: Recent Labs  Lab 11/22/20 0424 11/23/20 0422  AST 19 16  ALT 16 14  ALKPHOS 79 70  BILITOT 0.9 1.0  PROT 6.8 6.4*  ALBUMIN 3.4* 3.2*   No results for input(s): LIPASE, AMYLASE in the last 168 hours. Recent Labs  Lab 11/22/20 0425 11/25/20 0945  AMMONIA 35 48*   Coagulation Profile: Recent Labs  Lab 11/22/20 0424 11/23/20 0422 11/24/20 0620 11/25/20 0255  INR 2.7* 3.0* 2.4* 2.5*   Cardiac Enzymes: No results for input(s): CKTOTAL, CKMB, CKMBINDEX, TROPONINI in the last 168 hours. BNP (last 3 results) No results for input(s): PROBNP in the last 8760 hours. HbA1C: No results for input(s): HGBA1C in the last 72 hours. CBG: Recent Labs  Lab 11/24/20 0718 11/24/20 1150 11/24/20 1553 11/25/20 0605 11/25/20 1113  GLUCAP 113* 140* 174* 142* 195*   Lipid Profile: No results for input(s): CHOL, HDL, LDLCALC, TRIG, CHOLHDL, LDLDIRECT in the last 72 hours. Thyroid Function Tests: No results for input(s): TSH, T4TOTAL, FREET4, T3FREE, THYROIDAB in the last 72 hours. Anemia Panel: No results for input(s): VITAMINB12, FOLATE, FERRITIN, TIBC, IRON, RETICCTPCT in the last 72 hours. Sepsis  Labs: No results for input(s): PROCALCITON, LATICACIDVEN in the last 168 hours.  Recent Results (from the past 240 hour(s))  Resp Panel by RT-PCR (Flu A&B, Covid) Nasopharyngeal Swab     Status: None   Collection Time: 11/22/20  5:12 AM   Specimen: Nasopharyngeal Swab; Nasopharyngeal(NP) swabs in vial transport medium  Result Value Ref Range Status   SARS Coronavirus 2 by RT PCR NEGATIVE NEGATIVE Final    Comment: (NOTE) SARS-CoV-2 target nucleic acids are NOT DETECTED.  The SARS-CoV-2 RNA is generally detectable in upper respiratory specimens during the acute phase of infection. The lowest concentration of SARS-CoV-2 viral copies this assay can detect is 138 copies/mL. A negative result does not preclude SARS-Cov-2 infection and should not be used as the sole basis for treatment or other patient management decisions. A negative result may occur with  improper specimen collection/handling, submission of specimen other than nasopharyngeal swab, presence of viral mutation(s) within the areas targeted by this assay, and inadequate number of viral copies(<138 copies/mL). A negative result must be  combined with clinical observations, patient history, and epidemiological information. The expected result is Negative.  Fact Sheet for Patients:  EntrepreneurPulse.com.au  Fact Sheet for Healthcare Providers:  IncredibleEmployment.be  This test is no t yet approved or cleared by the [place] FDA and  has been authorized for detection and/or diagnosis of SARS-CoV-2 by FDA under an Emergency Use Authorization (EUA). This EUA will remain  in effect (meaning this test can be used) for the duration of the COVID-19 declaration under Section 564(b)(1) of the Act, 21 U.S.C.section 360bbb-3(b)(1), unless the authorization is terminated  or revoked sooner.       Influenza A by PCR NEGATIVE NEGATIVE Final   Influenza B by PCR NEGATIVE NEGATIVE Final     Comment: (NOTE) The Xpert Xpress SARS-CoV-2/FLU/RSV plus assay is intended as an aid in the diagnosis of influenza from Nasopharyngeal swab specimens and should not be used as a sole basis for treatment. Nasal washings and aspirates are unacceptable for Xpert Xpress SARS-CoV-2/FLU/RSV testing.  Fact Sheet for Patients: EntrepreneurPulse.com.au  Fact Sheet for Healthcare Providers: IncredibleEmployment.be  This test is not yet approved or cleared by the [place] FDA and has been authorized for detection and/or diagnosis of SARS-CoV-2 by FDA under an Emergency Use Authorization (EUA). This EUA will remain in effect (meaning this test can be used) for the duration of the COVID-19 declaration under Section 564(b)(1) of the Act, 21 U.S.C. section 360bbb-3(b)(1), unless the authorization is terminated or revoked.  Performed at [facility], [address] [place], [place] [address]   Culture, body fluid w Gram Stain-bottle     Status: None (Preliminary result)   Collection Time: 11/24/20  2:05 PM   Specimen: Peritoneal Washings  Result Value Ref Range Status   Specimen Description PERITONEAL  Final   Special Requests BOTTLES DRAWN AEROBIC AND ANAEROBIC 10 CC  Final   Culture   Final    NO GROWTH < 24 HOURS Performed at [facility], [address], [place], [place] [address]    Report Status PENDING  Incomplete  Gram stain     Status: None   Collection Time: 11/24/20  2:05 PM   Specimen: Peritoneal Washings  Result Value Ref Range Status   Specimen Description PERITONEAL  Final   Special Requests NONE PERITONEAL  Final   Gram Stain   Final    WBC PRESENT,BOTH PMN AND MONONUCLEAR NO ORGANISMS SEEN CYTOSPIN SMEAR Performed at [facility], [address], [place], [place] [address]    Report Status 11/24/2020 FINAL  Final         Radiology Studies: US Paracentesis  Result Date: 11/24/2020 INDICATION: Ascites, cirrhosis due to  NASH, history colon cancer EXAM: ULTRASOUND GUIDED DIAGNOSTIC AND THERAPEUTIC PARACENTESIS MEDICATIONS: None. COMPLICATIONS: None immediate. PROCEDURE: Informed written consent was obtained from the patient after a discussion of the risks, benefits and alternatives to treatment. A timeout was performed prior to the initiation of the procedure. Initial ultrasound scanning demonstrates a moderate amount of ascites within the right lower abdominal quadrant. The right lower abdomen was prepped and draped in the usual sterile fashion. 1% lidocaine was used for local anesthesia. Following this, a 5 [place] Yueh catheter was introduced. An ultrasound image was saved for documentation purposes. The paracentesis was performed. The catheter was removed and a dressing was applied. The patient tolerated the procedure well without immediate post procedural complication. FINDINGS: A total of approximately 2.4 L of yellow ascitic fluid was removed. Samples were sent to the laboratory as  requested by the clinical team. IMPRESSION: Successful ultrasound-guided paracentesis yielding 2.4 liters of peritoneal fluid. Electronically Signed   By: Lavonia Dana M.D.   On: 11/24/2020 14:57   [place] EKG SITE RITE  Result Date: 11/25/2020 If Site Rite image not attached, placement could not be confirmed due to current cardiac rhythm.       Scheduled Meds:  acetaZOLAMIDE  250 mg Oral BID   allopurinol  300 mg Oral Daily   atorvastatin  20 mg Oral Daily   Chlorhexidine Gluconate Cloth  6 each Topical Daily   furosemide  80 mg Intravenous Once   insulin aspart  0-5 Units Subcutaneous QHS   insulin aspart  0-9 Units Subcutaneous TID WC   insulin glargine-yfgn  16 Units Subcutaneous QHS   mouth rinse  15 mL Mouth Rinse BID   metoprolol succinate  25 mg Oral Daily   pantoprazole  40 mg Oral Daily   polyethylene glycol  17 g Oral Daily   sildenafil  20 mg Oral TID   tamsulosin  0.4 mg Oral Daily   warfarin  3 mg Oral ONCE-1600    Warfarin - Pharmacist Dosing Inpatient   Does not apply q1600   Continuous Infusions:  furosemide (LASIX) 200 mg in dextrose 5% 100 mL (76m/mL) infusion     milrinone       LOS: 3 days    Time spent: 358ms    PrDomenic PoliteMD [facility] 11/25/2020, 11:19 AM

## 2020-11-25 NOTE — Consult Note (Addendum)
Advanced Heart Failure Team Consult Note   Primary Physician: [name], William Kaufmann, MD PCP-Cardiologist:  Minus Breeding, MD HF MD: Dr William Flores  Reason for Consultation: HF/PAH  HPI:    William Flores is seen today for evaluation of PAH/HF  at the request of Dr William Flores.   70 y.o. male with history of chronic atrial fibrillation, chronic anticoagulation therapy w/ coumadin, h/o systolic HF (felt to be tachy-mediated, w/ eventual recovery of LVEF), chronic diastolic CHF, RV failure w/ pulmonary HTN, Diabetes, Obesity, OHS/OSA w/ poor compliance w/ CPAP, h/o AKI, h/o GIB, colon cancer treated w/ resection and h/o MGUS.     Patient was admitted in 02/2014 with hypoxic respiratory failure with acute systolic CHF and possible PNA.  He was in atrial fibrillation with RVR and developed AKI. Cardiomyopathy (EF 35-40% by echo in 2016) thought to be tachycardia-mediated at the time.  He was admitted again in 4/16 with hypotension and AKI as well as multiple metabolic derangements.  He required CVVH.  Echo at that time showed EF 60-65%.  He was again admitted in 5/16, again with hypoxic respiratory failure and was intubated.  EF 60-65% by echo on this admission.  He again had AKI and had HD x 1. Renal function again recovered. Also, per records, he had a LHC in 2008 that showed no significant CAD.    Had repeat echo 12/2018 which showed normal LVEF 50-55% w/ moderate LVH, normal LA size, mild MR, severely elevated pulmonary artery systolic pressure, 77 mmHg and moderately reduced right ventricular systolic function. Echo was obtained while admitted for acute anemia 2/2 acute GIB, in the setting of supratherpaeutic INR. INR was > 10.  Hgb 5.1. INR was reversed w/ Kcentra + Vit K. Treated w/ transfusion. Underwent GI w/u. EGD showed chronic gastritis but no clear source of bleeding. Biopsy negative for H Pylori. Colonoscopy showed several polyps, removed and biopsied. No high grade dysplasia. He was placed back on  Coumadin. INRs followed by PCP.    Admitted to [facility] for acute on chronic CHF 3/21. Also w/ AKI on admit. SCr was 1.8 (baseline 1.3-1.5). He was started on IV lasix for diuresis, 60 mg bid. Diuresis sluggish and eventually changed to lasix gtt. SCr has worsened w/ attempts at diuresis, w/ SCr rising to 2.3. Echo repeated. LVEF 55-60%. RV severely enlarged w/ severely reduced RV systolic function. Pulmonary artery systolic pressure severely elevated at 76 mmhg. There is mild TR. Also mild to moderate aortic valve sclerosis/calcification is present, without any evidence of aortic stenosis. He was subsequently transferred to [facility] for further management of his HF w/ formal RHC and AHF consultation.  On arrival to [facility], He was placed on high dose IV Lasix w/ better urinary response/ diuresis. RHC 4/8 with elevated filling pressures, mixed pulmonary venous/pulmonary hypertension. V/Q scan negative for chronic PE. He was continued on IV diuretics and diuresed > 20 lb and placed on PO torsemide 40 mg bid.  SCr improved w/ diuresis. Also started on trial of sildenafil 20 mg tid. Advised to continue supplemental home O2 and to restart regular use of CPAP. Also of note, a multiple myeloma panel was done given MGUS and showed elevated M spike protein. Urine immunofixation and UPEP both unremarkable. Discharge wt was 261 lb.    Patient was admitted in 6/22 with syncope.  He was at his PCP's office, stood up, and passed out.  AED was applied, no shock. CPR was done with ROSC < 1 minute. Echo  showed EF 50-55%, mild LVH, D-shaped septum, severe RV enlargement with severely decreased RV systolic function, PASP 90, IVC dilated.  He was discharged to a rehab facility in [place]. He was supposed to get a Zio monitor at discharge but this was not done.  Creatinine was elevated to 2.5 during this hospitalization.    In 10/22, he was admitted with GI bleeding/hematochezia.  He had large, oozing rectal polyps with high grade dysplasia.    Saw Dr William Flores 11/20/20  and was volume overloaded. Torsemide was increased to 80 mg twice a day and given metolazone twice a week. Meds provided at SNF.   Admitted to [facility] 11/22/20 with A/C HFpEF. Failed escalating diuretic regimen at home. Weight was up 30 pounds from August. CXR with vascular congestion. [place] abod with moderate ascites. Started on IV lasix. Echo this admit EF 55-60% and severe RV dysfunction + dilated IVC.   Sluggish diuresis noted. Had Paracentesis with 2.4 liters removed.    Review of Systems: [y] = yes, [ ]  = no   General: Weight gain [ Y]; Weight loss [ ] ; Anorexia [ ] ; Fatigue [Y ]; Fever [ ] ; Chills [ ] ; Weakness [ ]   Cardiac: Chest pain/pressure [ ] ; Resting SOB [ ] ; Exertional SOB [ Y]; Orthopnea [ ] ; Pedal Edema [ ] ; Palpitations [ ] ; Syncope [ ] ; Presyncope [ ] ; Paroxysmal nocturnal dyspnea[ ]   Pulmonary: Cough [ ] ; Wheezing[ ] ; Hemoptysis[ ] ; Sputum [ ] ; Snoring [ ]   GI: Vomiting[ ] ; Dysphagia[ ] ; Melena[ ] ; Hematochezia [ ] ; Heartburn[ ] ; Abdominal pain [ ] ; Constipation [ ] ; Diarrhea [ ] ; BRBPR [ ]   GU: Hematuria[ ] ; Dysuria [ ] ; Nocturia[ ]   Vascular: Pain in legs with walking [ ] ; Pain in feet with lying flat [ ] ; Non-healing sores [ ] ; Stroke [ ] ; TIA [ ] ; Slurred speech [ ] ;  Neuro: Headaches[ ] ; Vertigo[ ] ; Seizures[ ] ; Paresthesias[ ] ;Blurred vision [ ] ; Diplopia [ ] ; Vision changes [ ]   Ortho/Skin: Arthritis [ ] ; Joint pain [ ] Y; Muscle pain [ ] ; Joint swelling [ ] ; Back Pain [ ] ; Rash [ Y]  Psych: Depression[ ] ; Anxiety[ ]   Heme: Bleeding problems [ ] ; Clotting disorders [ ] ; Anemia [ ]   Endocrine: Diabetes [ ] ; Thyroid dysfunction[ ]   Home Medications Prior to Admission medications   Medication Sig Start Date End Date Taking? Authorizing Provider  allopurinol (ZYLOPRIM) 300 MG tablet Take 1 tablet (300 mg total) by mouth daily. 12/20/19  Yes [name], William Kaufmann, MD  insulin degludec (TRESIBA) 100 UNIT/ML FlexTouch Pen Inject 24 Units into the skin at  bedtime.   Yes [provider]  KLOR-CON M20 20 MEQ tablet Take 0.5 tablets by mouth daily. 10/31/20  Yes [provider]  metoprolol succinate (TOPROL-XL) 50 MG 24 hr tablet Take 75 mg by mouth daily. Takes 1.5 tabs TID (63m 3 times daily)   Yes [provider]  pantoprazole (PROTONIX) 40 MG tablet TAKE 1 TABLET BY MOUTH EVERY DAY 06/02/20  Yes [name], JFransisca Kaufmann MD  sildenafil (REVATIO) 20 MG tablet Take 1 tablet (20 mg total) by mouth 3 (three) times daily. 08/24/20  Yes MLarey Dresser MD  torsemide (DEMADEX) 20 MG tablet Take 3 tablets (60 mg total) by mouth 2 (two) times daily. 09/29/20  Yes MLarey Dresser MD  acetaminophen (TYLENOL) 325 MG tablet Take 2 tablets (650 mg total) by mouth every 6 (six) hours as needed for mild pain (or Fever >/= 101). 01/01/19  Roxan Hockey, MD  atorvastatin (LIPITOR) 20 MG tablet TAKE 1 TABLET BY MOUTH EVERY DAY 03/19/20   [name], William Kaufmann, MD  benzonatate (TESSALON) 100 MG capsule Take 1 capsule (100 mg total) by mouth 2 (two) times daily as needed for cough. 06/25/20   Patrecia Pour, MD  Dulaglutide (TRULICITY) 1.5 IN/8.6VE SOPN Inject 1.5 mg into the skin once a week. 12/20/19   [name], William Kaufmann, MD  ertugliflozin L-PyroglutamicAc (STEGLATRO) 5 MG TABS tablet Take 5 mg by mouth daily.    [provider]  ferrous sulfate 325 (65 FE) MG tablet Take 325 mg by mouth daily with breakfast.     [provider]  glucose blood (CONTOUR NEXT TEST) test strip Test BS TID Dx E11.29 05/22/20   [name], William Kaufmann, MD  Insulin Glargine (LANTUS SOLOSTAR [place]) Inject into the skin. 16 units    [provider]  insulin glargine-yfgn (SEMGLEE) 100 UNIT/ML Pen Inject 16 Units into the skin at bedtime.    [provider]  Insulin Pen Needle 32G X 4 MM MISC Use to give insulin daily Dx E11.29 04/15/19   Charlynne Cousins, MD  melatonin 5 MG TABS Take 5 mg by mouth at bedtime as needed.    [provider]  metoprolol succinate (TOPROL-XL) 25 MG 24 hr tablet Take 1 tablet (25 mg total) by mouth daily. 10/25/20   Orson Eva, MD  OXYGEN Inhale into the lungs. Continuous    [provider]  polyethylene glycol (MIRALAX / GLYCOLAX) 17 g packet Take 17 g by mouth daily. 01/01/19   Roxan Hockey, MD  warfarin (COUMADIN) 7.5 MG tablet Take 7.5 mg by mouth daily.    [provider]  diltiazem [facility]) 240 MG 24 hr capsule TAKE 1 CAPSULE DAILY 10/19/12 02/17/13  Vernie Shanks, MD    Past Medical History: Past Medical History:  Diagnosis Date   CAD (coronary artery disease)    LHC 2/08:  mRCA 50%, o/w no sig CAD, EF 25%   Cardiomyopathy ([place])    EF 35-40% in 2/16 in setting of AF with RVR >> echo 5/16 with improved LVF with EF 65-70%   Chronic atrial fibrillation (HCC)    Chronic diastolic heart failure (HCC)    HFPF - EF ~65-70% (OFTEN EXACERBATED BY AFIB)   CKD (chronic kidney disease)    hx of worsening renal failure and hyperK+ in setting of acute diast CHF >> required CVVHD in 4/16 and dialysis x 1 in 5/16   CKD stage 3 due to type 2 diabetes mellitus ([place]) 09/17/2015   Colon cancer ([place])    Colon polyps 09/21/2012   Tubular adenoma   Diabetes ([place])    Hyperlipidemia    Hypertension    Obesity hypoventilation syndrome ([place])    Renal insufficiency    Sleep apnea     Past Surgical History: Past Surgical History:  Procedure Laterality Date   CIRCUMCISION     COLON RESECTION     COLONOSCOPY N/A 09/21/2012   Procedure: COLONOSCOPY;  Surgeon: Inda Castle, MD;  Location: [facility];  Service: Endoscopy;  Laterality: N/A;   COLONOSCOPY N/A 12/28/2018   Procedure: COLONOSCOPY;  Surgeon: Rogene Houston, MD;  [facility]: [facility];  Service: Endoscopy;  Laterality: N/A;   COLONOSCOPY WITH PROPOFOL N/A 10/17/2020   Procedure: COLONOSCOPY WITH PROPOFOL;  Surgeon: Harvel Quale, MD;  Location: [facility];  Service: Gastroenterology;   Laterality: N/A;   ENTEROSCOPY N/A 06/24/2020  Procedure: ENTEROSCOPY;  Surgeon: Gatha Mayer, MD;  Location: [place];  Service: Endoscopy;  Laterality: N/A;   ESOPHAGOGASTRODUODENOSCOPY N/A 12/28/2018   Procedure: ESOPHAGOGASTRODUODENOSCOPY (EGD);  Surgeon: Rogene Houston, MD;  [facility];  Service: Endoscopy;  Laterality: N/A;   POLYPECTOMY  10/17/2020   Procedure: POLYPECTOMY;  Surgeon: Harvel Quale, MD;  Location: [facility];  Service: Gastroenterology;;  rectal, transverse, descending, ascending, sigmoid   RIGHT HEART CATH N/A 04/11/2019   Procedure: RIGHT HEART CATH;  Surgeon: Larey Dresser, MD;  Location: [facility];  Service: Cardiovascular;  Laterality: N/A;   US ECHOCARDIOGRAPHY  03/04/2010   abnormal study    Family History: Family History  Problem Relation Age of Onset   Breast cancer Mother    Diabetes Mother    Heart attack Father     Social History: Social History   Socioeconomic History   Marital status: Divorced    Spouse name: Not on file   Number of children: 1   Years of education: 12   Highest education level: 12th grade  Occupational History    Employer: [employer]  Tobacco Use   Smoking status: Former    Types: Cigarettes    Quit date: 08/06/1983    Years since quitting: 37.3   Smokeless tobacco: Never  Vaping Use   Vaping Use: Never used  Substance and Sexual Activity   Alcohol use: No   Drug use: No   Sexual activity: Not Currently  Other Topics Concern   Not on file  Social History Narrative   Lives alone   Social Determinants of Health   Financial Resource Strain: Low Risk    Difficulty of Paying Living Expenses: Not very hard  Food Insecurity: Not on file  Transportation Needs: Not on file  Physical Activity: Inactive   Days of Exercise per Week: 0 days   Minutes of Exercise per Session: 0 min  Stress: Not on file  Social Connections: Socially Isolated   Frequency of  Communication with Friends and Family: More than three times a week   Frequency of Social Gatherings with Friends and Family: More than three times a week   Attends Religious Services: Never   Marine scientist or Organizations: No   Attends Archivist Meetings: Never   Marital Status: Divorced    Allergies:  Allergies  Allergen Reactions   Codeine     Headache     Objective:    Vital Signs:   Temp:  [97.3 F (36.3 C)-97.8 F (36.6 C)] 97.6 F (36.4 C) (11/23 0756) Pulse Rate:  [90-102] 97 (11/23 0756) Resp:  [16-20] 20 (11/23 0756) BP: (98-126)/(62-78) 108/64 (11/23 0756) SpO2:  [91 %-97 %] 93 % (11/23 0756) FiO2 (%):  [4 %] 4 % (11/22 1350) Weight:  [127.1 kg] 127.1 kg (11/23 0000) Last BM Date: 11/23/20  Weight change: Filed Weights   11/23/20 0554 11/24/20 0812 11/25/20 0000  Weight: 129.4 kg 130.3 kg 127.1 kg    Intake/Output:   Intake/Output Summary (Last 24 hours) at 11/25/2020 0937 Last data filed at 11/25/2020 0757 Gross per 24 hour  Intake 1200 ml  Output 575 ml  Net 625 ml      Physical Exam    General:  Anasarca No resp difficulty HEENT: normal Neck: supple. JVP to jaw . Carotids 2+ bilat; no bruits. No lymphadenopathy or thyromegaly appreciated. Cor: PMI nondisplaced. Irregular rate & rhythm. No rubs, gallops or murmurs. Lungs: clear  Abdomen: Edema , nontender, distended. No hepatosplenomegaly. No bruits or masses. Good bowel sounds. Extremities: no cyanosis, clubbing, rash, R and LLE 2+ edema Neuro: alert & orientedx3, cranial nerves grossly intact. moves all 4 extremities w/o difficulty. Affect pleasant SKin: groin/pannus red rash GU: Scrotum edematous.    Telemetry   A fib   EKG    A fib   Labs   Basic Metabolic Panel: Recent Labs  Lab 11/20/20 1128 11/22/20 0424 11/23/20 0422 11/24/20 0620 11/25/20 0255  NA 139 140 138 138 136  K 3.9 4.2 4.3 4.4 4.5  CL 103 101 100 99 97*  CO2 31 30 31 31 30   GLUCOSE  108* 134* 137* 132* 171*  BUN 52* 58* 64* 68* 71*  CREATININE 2.09* 2.08* 2.28* 2.43* 2.48*  CALCIUM 8.5* 8.6* 8.5* 8.7* 8.6*  MG  --  2.5*  --   --   --   PHOS  --  4.6  --   --   --     Liver Function Tests: Recent Labs  Lab 11/22/20 0424 11/23/20 0422  AST 19 16  ALT 16 14  ALKPHOS 79 70  BILITOT 0.9 1.0  PROT 6.8 6.4*  ALBUMIN 3.4* 3.2*   No results for input(s): LIPASE, AMYLASE in the last 168 hours. Recent Labs  Lab 11/22/20 0425  AMMONIA 35    CBC: Recent Labs  Lab 11/20/20 1128 11/22/20 0424 11/23/20 0422 11/24/20 0620 11/25/20 0255  WBC 4.4 4.8 5.6 4.7 4.2  NEUTROABS  --  3.6  --   --   --   HGB 9.1* 8.9* 9.0* 9.5* 8.6*  HCT 33.9* 32.8* 33.6* 35.0* 31.5*  MCV 93.4 94.3 94.1 95.1 93.2  PLT 93* 102* 103* 94* 91*    Cardiac Enzymes: No results for input(s): CKTOTAL, CKMB, CKMBINDEX, TROPONINI in the last 168 hours.  BNP: BNP (last 3 results) Recent Labs    10/17/20 0428 11/20/20 1128 11/22/20 0425  BNP 290.0* 306.1* 270.0*    ProBNP (last 3 results) No results for input(s): PROBNP in the last 8760 hours.   CBG: Recent Labs  Lab 11/23/20 2041 11/24/20 0718 11/24/20 1150 11/24/20 1553 11/25/20 0605  GLUCAP 168* 113* 140* 174* 142*    Coagulation Studies: Recent Labs    11/23/20 0422 11/24/20 0620 11/25/20 0255  LABPROT 30.8* 25.7* 26.9*  INR 3.0* 2.4* 2.5*     Imaging   US Paracentesis  Result Date: 11/24/2020 INDICATION: Ascites, cirrhosis due to NASH, history colon cancer EXAM: ULTRASOUND GUIDED DIAGNOSTIC AND THERAPEUTIC PARACENTESIS MEDICATIONS: None. COMPLICATIONS: None immediate. PROCEDURE: Informed written consent was obtained from the patient after a discussion of the risks, benefits and alternatives to treatment. A timeout was performed prior to the initiation of the procedure. Initial ultrasound scanning demonstrates a moderate amount of ascites within the right lower abdominal quadrant. The right lower abdomen was  prepped and draped in the usual sterile fashion. 1% lidocaine was used for local anesthesia. Following this, a 5 [place] Yueh catheter was introduced. An ultrasound image was saved for documentation purposes. The paracentesis was performed. The catheter was removed and a dressing was applied. The patient tolerated the procedure well without immediate post procedural complication. FINDINGS: A total of approximately 2.4 L of yellow ascitic fluid was removed. Samples were sent to the laboratory as requested by the clinical team. IMPRESSION: Successful ultrasound-guided paracentesis yielding 2.4 liters of peritoneal fluid. Electronically Signed   By: Lavonia Dana M.D.   On: 11/24/2020 14:57  Medications:     Current Medications:  allopurinol  300 mg Oral Daily   atorvastatin  20 mg Oral Daily   Chlorhexidine Gluconate Cloth  6 each Topical Daily   insulin aspart  0-5 Units Subcutaneous QHS   insulin aspart  0-9 Units Subcutaneous TID WC   insulin glargine-yfgn  16 Units Subcutaneous QHS   mouth rinse  15 mL Mouth Rinse BID   metoprolol succinate  25 mg Oral Daily   pantoprazole  40 mg Oral Daily   polyethylene glycol  17 g Oral Daily   sildenafil  20 mg Oral TID   tamsulosin  0.4 mg Oral Daily   Warfarin - Pharmacist Dosing Inpatient   Does not apply q1600    Infusions:     Patient Profile   70 y.o. male with history of chronic atrial fibrillation, chronic anticoagulation therapy w/ coumadin, h/o systolic HF (felt to be tachy-mediated, w/ eventual recovery of LVEF), chronic diastolic CHF, RV failure w/ pulmonary HTN, Diabetes, Obesity, OHS/OSA w/ poor compliance w/ CPAP, h/o AKI, h/o GIB, colon cancer treated w/ resection and h/o MGUS.    Admitted with marked volume overload.     Assessment/Plan   A/C HFpEF ---> Cor Pulmonale Echo this admit LVEF 60% and severe RV failure  - Marked volume overload. Give 80 mg IV lasix now and start lasix drip at 15 mg per hour.  - Add milrinone  0.125 mcg to support RV/diurese.  -Place PICC. Follow CVP - Placed foley. Strict I/O s  2. PAH PASP elevated at 76 mmHg by 4/21 echo estimate with RV systolic function severely reduced. Most likely poorly controlled OHS/OSA as cause (group 3 PH).  V/Q scan negative for chronic PE.  Mixed PVH/PAH on RHC.  Echo in 6/22 with severe RV failure and PASP  Echo 11/2020 EF 60% RVSP in the 70s.  - Continue sildenafil 20 mg tid   3. Ascites 11/24/20 S/P Paracentesis 2.4 iters   4. CKD Stage IIIb  -Creatinine baseline 2.3-2. 4 -Creatinine 2.5  -Follow daily BMET   5. Chronic A fib - Continue Toprol Xl 25 mg daily  -On coumadin - INR 2.5   6. Obesity  Body mass index is 43.9 kg/m.  Consult SW. From SNF.    Length of Stay: 3  Amy Clegg, NP  11/25/2020, 9:37 AM  Advanced Heart Failure Team Pager [phone] (M-F; 7a - 5p)  Please contact [facility] for night-coverage after hours (4p -7a ) and weekends on [email or web address]  Patient seen with NP, agree with the above note.   He was admitted with intransigent HF despite increasing outpatient diuretics, he has severe RV failure in the setting of OHS/OSA.  Weight is up 30-40 lbs from prior baseline.  He had a paracentesis yesterday with 2.4 L off.   General: NAD Neck: JVP 16+, no thyromegaly or thyroid nodule.  Lungs: Decreased at bases. CV: Nondisplaced PMI.  Heart regular S1/S2, no S3/S4, no murmur.  2+ edema to torso.  No carotid bruit.  Normal pedal pulses.  Abdomen: Soft, nontender, no hepatosplenomegaly, mild-moderate distention.  Skin: Intact without lesions or rashes.  Neurologic: Alert and oriented x 3.  Psych: Normal affect. Extremities: No clubbing or cyanosis.  HEENT: Normal.   1. Cor pulmonale/RV failure: Suspect primarily due to OHS/OSA. Echo in 4/21 with EF 55-60%, RV severely dilated w/ severe RV dysfunction in the setting of severe pulmonary hypertension w/ PASP 76 mmHg. [place] 4/21 with elevated filling pressures, mixed  pulmonary  venous/pulmonary hypertension=> diuresed w/ IV Lasix in hospital and placed on po torsemide. Echo in 11/22 showed EF 55-60%, mod LVH, D-shaped septum, severe RV enlargement with severely decreased RV systolic function, IVC dilated. NYHA class IV.  He is volume overloaded on exam with weight up significantly from baseline.    - Lasix 80 mg IV x 1 now then 15 mg/hr.  - Start acetazolamide 250 mg bid.  - Milrinone 0.125 mcg/kg/min for severe RV dysfunction and cardiorenal syndrome.  - Place PICC to follow CVP and co-ox (poor dialysis candidate).  - Follow BMET closely.  2. Pulmonary HTN: PASP elevated at 76 mmHg by 4/21 echo estimate with RV systolic function severely reduced. Most likely poorly controlled OHS/OSA as cause (group 3 PH).  V/Q scan negative for chronic PE.  Mixed PVH/PAH on RHC.  Echo in 10/22 with severe RV failure and again this admission.   - Continue sildenafil 20 mg tid.  - Continue home oxygen and CPAP at night.    3. Stage III CKD: BMET today.  4. Atrial fibrillation: Chronic, unlikely to be able to successfully cardiovert given long-standing atrial fibrillation.  - Continue Toprol XL 50 mg daily.   - Continue warfarin, consider transition to [place] (will need to see why he is on warfarin instead).  5. OSA/OHS: Continue CPAP at night (needs to use) and continue oxygen during the day.  6. Diabetes: - Management per primary team.  7. MGUS: Previously followed by hem/onc but has not seen in several years.  Multiple myeloma panel 4/21 with presence of M-spike. - Followup with heme/onc.  8. Cirrhosis: ?Due to NAFLD. If creatinine remains stable with diuretic adjustment, will try to add spironolactone.  Paracentesis 2.4 L on 11/22.   Loralie Champagne 11/25/2020 10:55 AM

## 2020-11-25 NOTE — Evaluation (Signed)
Physical Therapy Evaluation Patient Details Name: William Flores MRN: [id number] DOB: 1950-05-17 Today's Date: 11/25/2020  History of Present Illness  Pt is a 70 y.o. male admitted from [facility] on 35/36/14 with acute exacerbation of CHF, severe pulmonary hypertension. S/p R paracentesis on 11/22. PMH includes a-fib, HTN, DM, CKD stage III, CAD, HLD, sleep apnea, cardiomyopathy, cirrhosis, acute on chronic respiratory failure (2L O2 baseline), obesity.   Clinical Impression  Pt presents with generalized weakness, decreased functional mobility, and decreased strength secondary to above. Pt coming from short-term rehab at [facility]. Prior to rehab, pt lived alone and was modified independent with RW/rollator. While at [facility], he needed assist from staff with ADLs and ambulated short distances without DME, requiring a w/c for longer distances. Today, pt needed min A+2 for transfers. Unable to complete further OOB activity due to fatigue. Pt wishes to return to SNF for further rehab - PT in agreement. Will continue to follow acutely to address short-term PT goals.         Recommendations for follow up therapy are one component of a multi-disciplinary discharge planning process, led by the attending physician.  Recommendations may be updated based on patient status, additional functional criteria and insurance authorization.  Follow Up Recommendations Skilled nursing-short term rehab (<3 hours/day)    Assistance Recommended at Discharge Frequent or constant Supervision/Assistance  Functional Status Assessment Patient has had a recent decline in their functional status and demonstrates the ability to make significant improvements in function in a reasonable and predictable amount of time.  Equipment Recommendations  Rolling walker (2 wheels);BSC/3in1    Recommendations for Other Services       Precautions / Restrictions Precautions Precautions: Fall;Other (comment) Precaution  Comments: Watch HR, SpO2 (wears 2L O2 baseline per chart) Restrictions Weight Bearing Restrictions: No      Mobility  Bed Mobility Overal bed mobility: Needs Assistance Bed Mobility: Supine to Sit     Supine to sit: Mod assist;HOB elevated     General bed mobility comments: Pt continues to state "if I can... if I can..." distracted by pain from catheter, requires max encouragement to perform as independently as possible; modA for HHA to elevate trunk, then pt able to scoot hips to EOB with min guard for safety/lines    Transfers Overall transfer level: Needs assistance Equipment used: 1 person hand held assist Transfers: Sit to/from Stand;Bed to chair/wheelchair/BSC Sit to Stand: Min assist;+2 safety/equipment   Step pivot transfers: Min assist;+2 safety/equipment       General transfer comment: Initial minA+2 for safety with standing and step pivot from bed to recliner, pt limited by urgency to have BM; additional step pivot from [facility] to bed with minA+1; pt declines use of RW despite reaching to furniture for added stability    Ambulation/Gait               General Gait Details: Mobility progression limited by fatigue  Stairs            Wheelchair Mobility    Modified Rankin (Stroke Patients Only)       Balance Overall balance assessment: Needs assistance Sitting-balance support: No upper extremity supported;Feet supported Sitting balance-Leahy Scale: Fair     Standing balance support: No upper extremity supported Standing balance-Leahy Scale: Fair Standing balance comment: can static stand without UE support, reliant on UE support to take steps; dependent for posterior pericare  Pertinent Vitals/Pain Pain Assessment: Faces Faces Pain Scale: Hurts whole lot Pain Location: scrotum/penis with catheter ("it feels like it's pulling") Pain Descriptors / Indicators: Discomfort;Grimacing Pain Intervention(s):  Monitored during session;Limited activity within patient's tolerance;Other (comment) (RN notified)    Home Living Family/patient expects to be discharged to:: Skilled nursing facility                   Additional Comments: From [facility] [facility] working with PT/OT    Prior Function Prior Level of Function : Needs assist       Physical Assist : Mobility (physical);ADLs (physical)     Mobility Comments: At SNF, working on ambulation short distances without DME, reliant on w/c for longer distances; prior to admission to SNF (10/2020 per chart), from home alone mod indep with rollator/RW - per prior PT Evaluation ADLs Comments: At SNF, requires assist from staff for ADLs     Hand Dominance        Extremity/Trunk Assessment   Upper Extremity Assessment Upper Extremity Assessment: Generalized weakness    Lower Extremity Assessment Lower Extremity Assessment: Generalized weakness       Communication   Communication: No difficulties  Cognition Arousal/Alertness: Awake/alert Behavior During Therapy: WFL for tasks assessed/performed Overall Cognitive Status: No family/caregiver present to determine baseline cognitive functioning Area of Impairment: Attention;Following commands;Safety/judgement;Awareness;Problem solving                   Current Attention Level: Selective   Following Commands: Follows one step commands with increased time Safety/Judgement: Decreased awareness of deficits;Decreased awareness of safety Awareness: Emergent Problem Solving: Requires verbal cues General Comments: requires frequent encouragement and cues to perform tasks as independently as possible, some decreased safety awareness and insight into deficits        General Comments General comments (skin integrity, edema, etc.): HR up to 139 with brief standing activity; session performed on supplemental oxygen    Exercises     Assessment/Plan    PT Assessment Patient needs continued  PT services  PT Problem List Decreased strength;Decreased activity tolerance;Decreased balance;Decreased mobility;Decreased cognition;Decreased knowledge of use of DME;Cardiopulmonary status limiting activity       PT Treatment Interventions DME instruction;Gait training;Functional mobility training;Therapeutic activities;Balance training;Therapeutic exercise;Patient/family education    PT Goals (Current goals can be found in the Care Plan section)  Acute Rehab PT Goals Patient Stated Goal: Return to SNF for continued rehab PT Goal Formulation: With patient Time For Goal Achievement: 12/09/20 Potential to Achieve Goals: Good    Frequency Min 2X/week   Barriers to discharge        Co-evaluation               AM-PAC PT "6 Clicks" Mobility  Outcome Measure Help needed turning from your back to your side while in a flat bed without using bedrails?: A Lot Help needed moving from lying on your back to sitting on the side of a flat bed without using bedrails?: A Lot Help needed moving to and from a bed to a chair (including a wheelchair)?: A Lot Help needed standing up from a chair using your arms (e.g., wheelchair or bedside chair)?: A Lot Help needed to walk in hospital room?: Total Help needed climbing 3-5 steps with a railing? : Total 6 Click Score: 10    End of Session   Activity Tolerance: Patient limited by fatigue Patient left: in bed;with call bell/phone within reach;with bed alarm set Nurse Communication: Mobility status (RN notified about HR during  activty and c/o cathater pulling. Notified tech about BM and need for bed pad to be changed later.) PT Visit Diagnosis: Other abnormalities of gait and mobility (R26.89);Muscle weakness (generalized) (M62.81)    Time: 7915-0569 PT Time Calculation (min) (ACUTE ONLY): 42 min   Charges:   PT Evaluation $PT Eval Moderate Complexity: 1 Mod PT Treatments $Therapeutic Activity: 8-22 mins        Brandon Melnick,  SPT   Brandon Melnick 11/25/2020, 5:55 PM

## 2020-11-26 DIAGNOSIS — I5033 Acute on chronic diastolic (congestive) heart failure: Secondary | ICD-10-CM | POA: Diagnosis not present

## 2020-11-26 DIAGNOSIS — I509 Heart failure, unspecified: Secondary | ICD-10-CM | POA: Diagnosis not present

## 2020-11-26 LAB — FERRITIN: Ferritin: 31 ng/mL (ref 24–336)

## 2020-11-26 LAB — IRON AND TIBC
Iron: 17 ug/dL — ABNORMAL LOW (ref 45–182)
Saturation Ratios: 4 % — ABNORMAL LOW (ref 17.9–39.5)
TIBC: 389 ug/dL (ref 250–450)
UIBC: 372 ug/dL

## 2020-11-26 LAB — CBC
HCT: 28.6 % — ABNORMAL LOW (ref 39.0–52.0)
Hemoglobin: 7.7 g/dL — ABNORMAL LOW (ref 13.0–17.0)
MCH: 24.9 pg — ABNORMAL LOW (ref 26.0–34.0)
MCHC: 26.9 g/dL — ABNORMAL LOW (ref 30.0–36.0)
MCV: 92.6 fL (ref 80.0–100.0)
Platelets: 94 10*3/uL — ABNORMAL LOW (ref 150–400)
RBC: 3.09 MIL/uL — ABNORMAL LOW (ref 4.22–5.81)
RDW: 19.4 % — ABNORMAL HIGH (ref 11.5–15.5)
WBC: 4.8 10*3/uL (ref 4.0–10.5)
nRBC: 0.4 % — ABNORMAL HIGH (ref 0.0–0.2)

## 2020-11-26 LAB — BASIC METABOLIC PANEL WITH GFR
Anion gap: 7 (ref 5–15)
BUN: 68 mg/dL — ABNORMAL HIGH (ref 8–23)
CO2: 31 mmol/L (ref 22–32)
Calcium: 8.1 mg/dL — ABNORMAL LOW (ref 8.9–10.3)
Chloride: 97 mmol/L — ABNORMAL LOW (ref 98–111)
Creatinine, Ser: 2.46 mg/dL — ABNORMAL HIGH (ref 0.61–1.24)
GFR, Estimated: 28 mL/min — ABNORMAL LOW
Glucose, Bld: 210 mg/dL — ABNORMAL HIGH (ref 70–99)
Potassium: 3.8 mmol/L (ref 3.5–5.1)
Sodium: 135 mmol/L (ref 135–145)

## 2020-11-26 LAB — RETICULOCYTES
Immature Retic Fract: 27.1 % — ABNORMAL HIGH (ref 2.3–15.9)
RBC.: 3.12 MIL/uL — ABNORMAL LOW (ref 4.22–5.81)
Retic Count, Absolute: 62.4 10*3/uL (ref 19.0–186.0)
Retic Ct Pct: 2 % (ref 0.4–3.1)

## 2020-11-26 LAB — GLUCOSE, CAPILLARY
Glucose-Capillary: 169 mg/dL — ABNORMAL HIGH (ref 70–99)
Glucose-Capillary: 158 mg/dL — ABNORMAL HIGH (ref 70–99)
Glucose-Capillary: 150 mg/dL — ABNORMAL HIGH (ref 70–99)
Glucose-Capillary: 169 mg/dL — ABNORMAL HIGH (ref 70–99)

## 2020-11-26 LAB — COOXEMETRY PANEL
Carboxyhemoglobin: 1.9 % — ABNORMAL HIGH (ref 0.5–1.5)
Methemoglobin: 0.8 % (ref 0.0–1.5)
O2 Saturation: 57.6 %
Total hemoglobin: 7.8 g/dL — ABNORMAL LOW (ref 12.0–16.0)

## 2020-11-26 LAB — PROTIME-INR
INR: 3.1 — ABNORMAL HIGH (ref 0.8–1.2)
Prothrombin Time: 31.7 seconds — ABNORMAL HIGH (ref 11.4–15.2)

## 2020-11-26 LAB — VITAMIN B12: Vitamin B-12: 509 pg/mL (ref 180–914)

## 2020-11-26 LAB — MAGNESIUM: Magnesium: 2.4 mg/dL (ref 1.7–2.4)

## 2020-11-26 LAB — FOLATE: Folate: 8.1 ng/mL (ref >5.9)

## 2020-11-26 MED ORDER — SODIUM CHLORIDE 0.9% FLUSH
10.0000 mL | Freq: Two times a day (BID) | INTRAVENOUS | Status: DC
  Administered 2020-11-27 – 2020-12-03 (×9): 10 mL
  Administered 2020-12-04: 09:00:00 40 mL
  Administered 2020-12-05 – 2020-12-06 (×3): 10 mL
  Administered 2020-12-06: 09:00:00 30 mL
  Administered 2020-12-07 – 2020-12-11 (×7): 10 mL

## 2020-11-26 MED ORDER — DIPHENHYDRAMINE HCL 25 MG PO CAPS
25.0000 mg | ORAL_CAPSULE | Freq: Four times a day (QID) | ORAL | Status: DC | PRN
  Administered 2020-11-26: 25 mg via ORAL
  Filled 2020-11-26: qty 1

## 2020-11-26 MED ORDER — SODIUM CHLORIDE 0.9% FLUSH: 10.0000 mL | INTRAVENOUS | Status: DC | PRN

## 2020-11-26 MED ORDER — POTASSIUM CHLORIDE CRYS ER 20 MEQ PO TBCR
20.0000 meq | EXTENDED_RELEASE_TABLET | Freq: Once | ORAL | Status: AC
  Administered 2020-11-26: 20 meq via ORAL
  Filled 2020-11-26: qty 1

## 2020-11-26 NOTE — Progress Notes (Signed)
Patient complained of itching , hydrocortisone   cream applied  with no relief . Md made aware awaiting orders.

## 2020-11-26 NOTE — Progress Notes (Signed)
PROGRESS NOTE    William Flores  QIW:[id number] DOB: 1950-07-07 DOA: 11/22/2020 PCP: [name], [name], MD    Brief Narrative:  70 year old male with a history of atrial fibrillation on anticoagulation, chronic kidney disease stage IIIb, chronic right-sided heart failure, pulmonary hypertension, cirrhosis, who is currently at [facility] skilled nursing facility.  He was admitted to the hospital with increasing lower extremity edema and scrotal swelling.   -Weight was up 30 pounds from August, chest x-ray noted vascular congestion, ultrasound noted moderate ascites, echo with EF of 55-60%, severe RV dysfunction and dilated IVC -11/22 had paracentesis, 2.4 L removed  Assessment & Plan:   Acute on chronic right-sided heart failure, cor pulmonale Severe pulmonary hypertension -Worsening fluid overload despite recent increase in diuretics -Remains volume overload diet, he is 5.1 L negative-urine output and 2.4 L via paracentesis -Cardiology consulting, now on Lasix drip, milrinone for severe RV dysfunction and cardiorenal syndrome  -Remains on sildenafil for severe PAH, continue home O2 and CPAP -Monitor urine output closely -BMP in a.m.  Acute on chronic anemia -Worsening hemoglobin noted today, patient denies any overt bleeding, check anemia panel, will need blood transfusion if this trends down further -He is anticoagulated with Coumadin for A. fib, may not be able to tolerate this long-term given liver disease and chronic thrombocytopenia   Acute on chronic respiratory failure with hypoxia -Chronically on 2 L of oxygen, currently requiring 4L -As above   Cirrhosis secondary to NASH -Abdominal ultrasound showed mild to mod ascites -since creatinine trending up with diuretics, underwent paracentesis 11/22, 2.4 L removed -LFTs are stable, albumin is low  Mild hepatic encephalopathy -Was drowsy with asterixis 11/23, ammonia level was mildly elevated, was started on lactulose  yesterday, mental status improving   Chronic atrial fibrillation -Heart rate is currently stable -Continue on outpatient dose of Toprol -He is anticoagulated with Coumadin, if platelet count continues to trend down may need to discontinue Coumadin in the future    AKI on Chronic kidney disease stage IIIb -Baseline creatinine appears to be around 2.0 -Creatinine stabilizing, currently on milrinone   Thrombocytopenia -Related to liver disease -Continue to follow   Diabetes mellitus -Continued on basal insulin -Follow blood sugars with sliding scale -Blood sugars currently stable   Obstructive sleep apnea -Continue on CPAP   Morbid obesity -BMI 49.2 -Increased risk of mobility/mortality   GERD -Continue Protonix  DVT prophylaxis: Coumadin  Code Status: Full code Family Communication: discussed with patient in detail, no family at bedside Disposition Plan: Status is: Inpatient  Remains inpatient appropriate because: continued IV diuresis, severity of illness  Consultants:  Cardiology  Procedures:  Paracentesis 11/22-2.4 L drained Echo: LVEF 41-74%, RV systolic function severely reduced  Antimicrobials:      Subjective: -Feels okay, breathing is improving  Objective: Vitals:   11/26/20 0004 11/26/20 0317 11/26/20 0725 11/26/20 1143  BP: (!) 112/37 (!) 117/46 (!) 130/55 (!) 117/59  Pulse: 99 97 (!) 108 97  Resp: (!) 24 18 20 20   Temp: 98.2 F (36.8 C) 98.1 F (36.7 C) 98.1 F (36.7 C) 98.1 F (36.7 C)  TempSrc: Oral Oral  Oral  SpO2: 93% 91% 95% 95%  Weight:  127 kg    Height:        Intake/Output Summary (Last 24 hours) at 11/26/2020 1209 Last data filed at 11/26/2020 0818 Gross per 24 hour  Intake 1122.18 ml  Output 1950 ml  Net -827.82 ml   Filed Weights   11/24/20 0812 11/25/20 0000  11/26/20 0317  Weight: 130.3 kg 127.1 kg 127 kg    Examination:  General exam: Obese chronically ill male sitting up in bed, more awake and alert, oriented  x3, no distress CVS: S1-S2, regular rate rhythm Lungs: Decreased breath sounds to bases Abdomen: Soft, nontender bowel sounds present, fluid thrill noted Extremities: Trace edema  Skin: No rash on exposed skin Psych: Flat affect  Data Reviewed: I have personally reviewed following labs and imaging studies  CBC: Recent Labs  Lab 11/22/20 0424 11/23/20 0422 11/24/20 0620 11/25/20 0255 11/26/20 0450  WBC 4.8 5.6 4.7 4.2 4.8  NEUTROABS 3.6  --   --   --   --   HGB 8.9* 9.0* 9.5* 8.6* 7.7*  HCT 32.8* 33.6* 35.0* 31.5* 28.6*  MCV 94.3 94.1 95.1 93.2 92.6  PLT 102* 103* 94* 91* 94*   Basic Metabolic Panel: Recent Labs  Lab 11/22/20 0424 11/23/20 0422 11/24/20 0620 11/25/20 0255 11/26/20 0450  NA 140 138 138 136 135  K 4.2 4.3 4.4 4.5 3.8  CL 101 100 99 97* 97*  CO2 30 31 31 30 31   GLUCOSE 134* 137* 132* 171* 210*  BUN 58* 64* 68* 71* 68*  CREATININE 2.08* 2.28* 2.43* 2.48* 2.46*  CALCIUM 8.6* 8.5* 8.7* 8.6* 8.1*  MG 2.5*  --   --   --  2.4  PHOS 4.6  --   --   --   --    GFR: Estimated Creatinine Clearance: 36.8 mL/min (A) (by C-G formula based on SCr of 2.46 mg/dL (H)). Liver Function Tests: Recent Labs  Lab 11/22/20 0424 11/23/20 0422  AST 19 16  ALT 16 14  ALKPHOS 79 70  BILITOT 0.9 1.0  PROT 6.8 6.4*  ALBUMIN 3.4* 3.2*   No results for input(s): LIPASE, AMYLASE in the last 168 hours. Recent Labs  Lab 11/22/20 0425 11/25/20 0945  AMMONIA 35 48*   Coagulation Profile: Recent Labs  Lab 11/22/20 0424 11/23/20 0422 11/24/20 0620 11/25/20 0255 11/26/20 0450  INR 2.7* 3.0* 2.4* 2.5* 3.1*   Cardiac Enzymes: No results for input(s): CKTOTAL, CKMB, CKMBINDEX, TROPONINI in the last 168 hours. BNP (last 3 results) No results for input(s): PROBNP in the last 8760 hours. HbA1C: No results for input(s): HGBA1C in the last 72 hours. CBG: Recent Labs  Lab 11/25/20 1113 11/25/20 1539 11/25/20 2142 11/26/20 0618 11/26/20 1142  GLUCAP 195* 157* 169*  169* 158*   Lipid Profile: No results for input(s): CHOL, HDL, LDLCALC, TRIG, CHOLHDL, LDLDIRECT in the last 72 hours. Thyroid Function Tests: No results for input(s): TSH, T4TOTAL, FREET4, T3FREE, THYROIDAB in the last 72 hours. Anemia Panel: No results for input(s): VITAMINB12, FOLATE, FERRITIN, TIBC, IRON, RETICCTPCT in the last 72 hours. Sepsis Labs: No results for input(s): PROCALCITON, LATICACIDVEN in the last 168 hours.  Recent Results (from the past 240 hour(s))  Resp Panel by RT-PCR (Flu A&B, Covid) Nasopharyngeal Swab     Status: None   Collection Time: 11/22/20  5:12 AM   Specimen: Nasopharyngeal Swab; Nasopharyngeal(NP) swabs in vial transport medium  Result Value Ref Range Status   SARS Coronavirus 2 by RT PCR NEGATIVE NEGATIVE Final    Comment: (NOTE) SARS-CoV-2 target nucleic acids are NOT DETECTED.  The SARS-CoV-2 RNA is generally detectable in upper respiratory specimens during the acute phase of infection. The lowest concentration of SARS-CoV-2 viral copies this assay can detect is 138 copies/mL. A negative result does not preclude SARS-Cov-2 infection and should not be used as  the sole basis for treatment or other patient management decisions. A negative result may occur with  improper specimen collection/handling, submission of specimen other than nasopharyngeal swab, presence of viral mutation(s) within the areas targeted by this assay, and inadequate number of viral copies(<138 copies/mL). A negative result must be combined with clinical observations, patient history, and epidemiological information. The expected result is Negative.  Fact Sheet for Patients:  EntrepreneurPulse.com.au  Fact Sheet for Healthcare Providers:  IncredibleEmployment.be  This test is no t yet approved or cleared by the [place] FDA and  has been authorized for detection and/or diagnosis of SARS-CoV-2 by FDA under an Emergency Use  Authorization (EUA). This EUA will remain  in effect (meaning this test can be used) for the duration of the COVID-19 declaration under Section 564(b)(1) of the Act, 21 U.S.C.section 360bbb-3(b)(1), unless the authorization is terminated  or revoked sooner.       Influenza A by PCR NEGATIVE NEGATIVE Final   Influenza B by PCR NEGATIVE NEGATIVE Final    Comment: (NOTE) The Xpert Xpress SARS-CoV-2/FLU/RSV plus assay is intended as an aid in the diagnosis of influenza from Nasopharyngeal swab specimens and should not be used as a sole basis for treatment. Nasal washings and aspirates are unacceptable for Xpert Xpress SARS-CoV-2/FLU/RSV testing.  Fact Sheet for Patients: EntrepreneurPulse.com.au  Fact Sheet for Healthcare Providers: IncredibleEmployment.be  This test is not yet approved or cleared by the [place] FDA and has been authorized for detection and/or diagnosis of SARS-CoV-2 by FDA under an Emergency Use Authorization (EUA). This EUA will remain in effect (meaning this test can be used) for the duration of the COVID-19 declaration under Section 564(b)(1) of the Act, 21 U.S.C. section 360bbb-3(b)(1), unless the authorization is terminated or revoked.  Performed at [facility], [address], [place], [place] [address]   Culture, body fluid w Gram Stain-bottle     Status: None (Preliminary result)   Collection Time: 11/24/20  2:05 PM   Specimen: Peritoneal Washings  Result Value Ref Range Status   Specimen Description PERITONEAL  Final   Special Requests BOTTLES DRAWN AEROBIC AND ANAEROBIC 10 CC  Final   Culture   Final    NO GROWTH 2 DAYS Performed at [facility], [address] [place], [place] [address]    Report Status PENDING  Incomplete  Gram stain     Status: None   Collection Time: 11/24/20  2:05 PM   Specimen: Peritoneal Washings  Result Value Ref Range Status   Specimen Description PERITONEAL  Final   Special  Requests NONE PERITONEAL  Final   Gram Stain   Final    WBC PRESENT,BOTH PMN AND MONONUCLEAR NO ORGANISMS SEEN CYTOSPIN SMEAR Performed at [facility], [address] [place],  [address]    Report Status 11/24/2020 FINAL  Final         Radiology Studies: US Paracentesis  Result Date: 11/24/2020 INDICATION: Ascites, cirrhosis due to NASH, history colon cancer EXAM: ULTRASOUND GUIDED DIAGNOSTIC AND THERAPEUTIC PARACENTESIS MEDICATIONS: None. COMPLICATIONS: None immediate. PROCEDURE: Informed written consent was obtained from the patient after a discussion of the risks, benefits and alternatives to treatment. A timeout was performed prior to the initiation of the procedure. Initial ultrasound scanning demonstrates a moderate amount of ascites within the right lower abdominal quadrant. The right lower abdomen was prepped and draped in the usual sterile fashion. 1% lidocaine was used for local anesthesia. Following this, a 5 [place] Yueh catheter was introduced. An ultrasound image  was saved for documentation purposes. The paracentesis was performed. The catheter was removed and a dressing was applied. The patient tolerated the procedure well without immediate post procedural complication. FINDINGS: A total of approximately 2.4 L of yellow ascitic fluid was removed. Samples were sent to the laboratory as requested by the clinical team. IMPRESSION: Successful ultrasound-guided paracentesis yielding 2.4 liters of peritoneal fluid. Electronically Signed   By: Lavonia Dana M.D.   On: 11/24/2020 14:57   DG CHEST PORT 1 VIEW  Result Date: 11/25/2020 CLINICAL DATA:  PICC line placement EXAM: PORTABLE CHEST 1 VIEW COMPARISON:  11/22/2020 FINDINGS: Right upper extremity central venous catheter tip projects over SVC origin. Cardiomegaly with vascular congestion, pulmonary edema and pleural effusions without significant change. Aortic atherosclerosis. IMPRESSION: 1. Right upper extremity central venous  catheter tip overlies the SVC origin 2. Cardiomegaly with continued vascular congestion, edema and pleural effusions. Electronically Signed   By: Donavan Foil M.D.   On: 11/25/2020 22:30   [place] EKG SITE RITE  Result Date: 11/25/2020 If Site Rite image not attached, placement could not be confirmed due to current cardiac rhythm.       Scheduled Meds:  allopurinol  300 mg Oral Daily   atorvastatin  20 mg Oral Daily   Chlorhexidine Gluconate Cloth  6 each Topical Daily   insulin aspart  0-5 Units Subcutaneous QHS   insulin aspart  0-9 Units Subcutaneous TID WC   insulin glargine-yfgn  16 Units Subcutaneous QHS   lactulose  20 g Oral BID   mouth rinse  15 mL Mouth Rinse BID   metoprolol succinate  25 mg Oral Daily   pantoprazole  40 mg Oral Daily   polyethylene glycol  17 g Oral Daily   sildenafil  20 mg Oral TID   tamsulosin  0.4 mg Oral Daily   Warfarin - Pharmacist Dosing Inpatient   Does not apply q1600   Continuous Infusions:  furosemide (LASIX) 200 mg in dextrose 5% 100 mL (47m/mL) infusion 15 mg/hr (11/26/20 0235)   milrinone 0.125 mcg/kg/min (11/26/20 0328)     LOS: 4 days    Time spent: 267ms    PrDomenic PoliteMD [facility] 11/26/2020, 12:09 PM

## 2020-11-26 NOTE — Progress Notes (Signed)
Patient complained of scrotal discomfort due to edema , scrotum elevated.

## 2020-11-26 NOTE — Progress Notes (Addendum)
ANTICOAGULATION CONSULT NOTE  Pharmacy Consult for warfarin Indication: atrial fibrillation  Allergies  Allergen Reactions   Codeine     Headache     Patient Measurements: Height: 5' 7"  (170.2 cm) Weight: 127 kg (279 lb 14.4 oz) IBW/kg (Calculated) : 66.1  Vital Signs: Temp: 98.1 F (36.7 C) (11/24 1143) Temp Source: Oral (11/24 1143) BP: 117/59 (11/24 1143) Pulse Rate: 97 (11/24 1143)  Labs: Recent Labs    11/24/20 0620 11/25/20 0255 11/26/20 0450  HGB 9.5* 8.6* 7.7*  HCT 35.0* 31.5* 28.6*  PLT 94* 91* 94*  LABPROT 25.7* 26.9* 31.7*  INR 2.4* 2.5* 3.1*  CREATININE 2.43* 2.48* 2.46*     Estimated Creatinine Clearance: 36.8 mL/min (A) (by C-G formula based on SCr of 2.46 mg/dL (H)).   Medical History: Past Medical History:  Diagnosis Date   CAD (coronary artery disease)    LHC 2/08:  mRCA 50%, o/w no sig CAD, EF 25%   Cardiomyopathy ([place])    EF 35-40% in 2/16 in setting of AF with RVR >> echo 5/16 with improved LVF with EF 65-70%   Chronic atrial fibrillation (HCC)    Chronic diastolic heart failure (HCC)    HFPF - EF ~65-70% (OFTEN EXACERBATED BY AFIB)   CKD (chronic kidney disease)    hx of worsening renal failure and hyperK+ in setting of acute diast CHF >> required CVVHD in 4/16 and dialysis x 1 in 5/16   CKD stage 3 due to type 2 diabetes mellitus ([place]) 09/17/2015   Colon cancer ([place])    Colon polyps 09/21/2012   Tubular adenoma   Diabetes ([place])    Hyperlipidemia    Hypertension    Obesity hypoventilation syndrome ([place])    Renal insufficiency    Sleep apnea     Medications:  Medications Prior to Admission  Medication Sig Dispense Refill Last Dose   allopurinol (ZYLOPRIM) 300 MG tablet Take 1 tablet (300 mg total) by mouth daily. 90 tablet 3    insulin degludec (TRESIBA) 100 UNIT/ML FlexTouch Pen Inject 24 Units into the skin at bedtime.      KLOR-CON M20 20 MEQ tablet Take 0.5 tablets by mouth daily.      metoprolol succinate (TOPROL-XL) 50  MG 24 hr tablet Take 75 mg by mouth daily. Takes 1.5 tabs TID (69m 3 times daily)      pantoprazole (PROTONIX) 40 MG tablet TAKE 1 TABLET BY MOUTH EVERY DAY 90 tablet 1    sildenafil (REVATIO) 20 MG tablet Take 1 tablet (20 mg total) by mouth 3 (three) times daily. 90 tablet 11    torsemide (DEMADEX) 20 MG tablet Take 3 tablets (60 mg total) by mouth 2 (two) times daily. 540 tablet 3    acetaminophen (TYLENOL) 325 MG tablet Take 2 tablets (650 mg total) by mouth every 6 (six) hours as needed for mild pain (or Fever >/= 101). 12 tablet 0    atorvastatin (LIPITOR) 20 MG tablet TAKE 1 TABLET BY MOUTH EVERY DAY 90 tablet 3    benzonatate (TESSALON) 100 MG capsule Take 1 capsule (100 mg total) by mouth 2 (two) times daily as needed for cough.      Dulaglutide (TRULICITY) 1.5 MOM/6.0OKSOPN Inject 1.5 mg into the skin once a week. 6 mL 3    ertugliflozin L-PyroglutamicAc (STEGLATRO) 5 MG TABS tablet Take 5 mg by mouth daily.      ferrous sulfate 325 (65 FE) MG tablet Take 325 mg by mouth daily with breakfast.  glucose blood (CONTOUR NEXT TEST) test strip Test BS TID Dx E11.29 300 strip 3    Insulin Glargine (LANTUS SOLOSTAR [place]) Inject into the skin. 16 units      insulin glargine-yfgn (SEMGLEE) 100 UNIT/ML Pen Inject 16 Units into the skin at bedtime.      Insulin Pen Needle 32G X 4 MM MISC Use to give insulin daily Dx E11.29 100 each 3    melatonin 5 MG TABS Take 5 mg by mouth at bedtime as needed.      metoprolol succinate (TOPROL-XL) 25 MG 24 hr tablet Take 1 tablet (25 mg total) by mouth daily.      OXYGEN Inhale into the lungs. Continuous      polyethylene glycol (MIRALAX / GLYCOLAX) 17 g packet Take 17 g by mouth daily. 14 each 0    warfarin (COUMADIN) 7.5 MG tablet Take 7.5 mg by mouth daily.       Assessment: Pharmacy consulted to dose warfarin in patient with atrial fibrillation.  INR on admission is 2.7.  Patient is an unreliable historian.  Home dose listed as 7.5 mg daily per med  rec.    Patient recently admitted here in October for GI bleed and per GI recommended INR 2-2.5.  INR has increased despite holding warfarin for two days (11/20 and 11/21) and two days of doses lower than home regimen. INR today of 3.1 above goal (up from 2.5 yesterday). Hgb down 7.7, pl 94 stable. No s/sx of bleeding.   Goal of Therapy:  INR 2-2.5 Monitor platelets by anticoagulation protocol: Yes   Plan:  Hold warfarin for today Monitor daily INR and s/s of bleeding.  Laurey Arrow, PharmD PGY1 Pharmacy Resident 11/26/2020  12:19 PM  Please check [email or web address] for unit-specific pharmacy phone numbers.

## 2020-11-26 NOTE — Progress Notes (Signed)
Advanced Heart Failure Rounding Note   Subjective:    On milrinone 0.125 co-ox 56%  On lasix gtt at 15 and diamox 250 bid. Urine output has picked up but weight down only half a pound.   Feels bloated. Scrotum hurts from swelling. Denies SOB, orthopnea or PND. CVP 20  SCr stable at 2.5   Objective:   Weight Range:  Vital Signs:   Temp:  [98 F (36.7 C)-98.2 F (36.8 C)] 98.1 F (36.7 C) (11/24 0725) Pulse Rate:  [97-108] 108 (11/24 0725) Resp:  [18-24] 20 (11/24 0725) BP: (112-130)/(37-69) 130/55 (11/24 0725) SpO2:  [91 %-96 %] 95 % (11/24 0725) Weight:  [604 kg] 127 kg (11/24 0317) Last BM Date: 11/25/20  Weight change: Filed Weights   11/24/20 0812 11/25/20 0000 11/26/20 0317  Weight: 130.3 kg 127.1 kg 127 kg    Intake/Output:   Intake/Output Summary (Last 24 hours) at 11/26/2020 1044 Last data filed at 11/26/2020 0818 Gross per 24 hour  Intake 1122.18 ml  Output 1950 ml  Net -827.82 ml     Physical Exam: General:  Lying in bed No resp difficulty HEENT: normal Neck: supple. JVP to ear. Carotids 2+ bilat; no bruits. No lymphadenopathy or thryomegaly appreciated. Cor: PMI nondisplaced. Irreg 2/6 TR Lungs: decreased throughout  Abdomen: obese soft, nontender, + distended. No hepatosplenomegaly. No bruits or masses. Good bowel sounds. Extremities: no cyanosis, clubbing, rash, 3+ edema + UNNA Neuro: alert & orientedx3, cranial nerves grossly intact. moves all 4 extremities w/o difficulty. Affect pleasant  Telemetry: AF 95-110 Personally reviewed   Labs: Basic Metabolic Panel: Recent Labs  Lab 11/22/20 0424 11/23/20 0422 11/24/20 0620 11/25/20 0255 11/26/20 0450  NA 140 138 138 136 135  K 4.2 4.3 4.4 4.5 3.8  CL 101 100 99 97* 97*  CO2 _0 GLUCOSE 134* 137* 132* 171* 210*  BUN 58* 64* 68* 71* 68*  CREATININE 2.08* 2.28* 2.43* 2.48* 2.46*  CALCIUM 8.6* 8.5* 8.7* 8.6* 8.1*  MG 2.5*  --   --   --  2.4  PHOS 4.6  --   --   --   --      Liver Function Tests: Recent Labs  Lab 11/22/20 0424 11/23/20 0422  AST 19 16  ALT 16 14  ALKPHOS 79 70  BILITOT 0.9 1.0  PROT 6.8 6.4*  ALBUMIN 3.4* 3.2*   No results for input(s): LIPASE, AMYLASE in the last 168 hours. Recent Labs  Lab 11/22/20 0425 11/25/20 0945  AMMONIA 35 48*    CBC: Recent Labs  Lab 11/22/20 0424 11/23/20 0422 11/24/20 0620 11/25/20 0255 11/26/20 0450  WBC 4.8 5.6 4.7 4.2 4.8  NEUTROABS 3.6  --   --   --   --   HGB 8.9* 9.0* 9.5* 8.6* 7.7*  HCT 32.8* 33.6* 35.0* 31.5* 28.6*  MCV 94.3 94.1 95.1 93.2 92.6  PLT 102* 103* 94* 91* 94*    Cardiac Enzymes: No results for input(s): CKTOTAL, CKMB, CKMBINDEX, TROPONINI in the last 168 hours.  BNP: BNP (last 3 results) Recent Labs    10/17/20 0428 11/20/20 1128 11/22/20 0425  BNP 290.0* 306.1* 270.0*    ProBNP (last 3 results) No results for input(s): PROBNP in the last 8760 hours.    Other results:  Imaging: US Paracentesis  Result Date: 11/24/2020 INDICATION: Ascites, cirrhosis due to NASH, history colon cancer EXAM: ULTRASOUND GUIDED DIAGNOSTIC AND THERAPEUTIC PARACENTESIS MEDICATIONS: None. COMPLICATIONS: None immediate. PROCEDURE: Informed written  consent was obtained from the patient after a discussion of the risks, benefits and alternatives to treatment. A timeout was performed prior to the initiation of the procedure. Initial ultrasound scanning demonstrates a moderate amount of ascites within the right lower abdominal quadrant. The right lower abdomen was prepped and draped in the usual sterile fashion. 1% lidocaine was used for local anesthesia. Following this, a 5 [place] Yueh catheter was introduced. An ultrasound image was saved for documentation purposes. The paracentesis was performed. The catheter was removed and a dressing was applied. The patient tolerated the procedure well without immediate post procedural complication. FINDINGS: A total of approximately 2.4 L of yellow  ascitic fluid was removed. Samples were sent to the laboratory as requested by the clinical team. IMPRESSION: Successful ultrasound-guided paracentesis yielding 2.4 liters of peritoneal fluid. Electronically Signed   By: Lavonia Dana M.D.   On: 11/24/2020 14:57   DG CHEST PORT 1 VIEW  Result Date: 11/25/2020 CLINICAL DATA:  PICC line placement EXAM: PORTABLE CHEST 1 VIEW COMPARISON:  11/22/2020 FINDINGS: Right upper extremity central venous catheter tip projects over SVC origin. Cardiomegaly with vascular congestion, pulmonary edema and pleural effusions without significant change. Aortic atherosclerosis. IMPRESSION: 1. Right upper extremity central venous catheter tip overlies the SVC origin 2. Cardiomegaly with continued vascular congestion, edema and pleural effusions. Electronically Signed   By: Donavan Foil M.D.   On: 11/25/2020 22:30   [place] EKG SITE RITE  Result Date: 11/25/2020 If Site Rite image not attached, placement could not be confirmed due to current cardiac rhythm.    Medications:     Scheduled Medications:  allopurinol  300 mg Oral Daily   atorvastatin  20 mg Oral Daily   Chlorhexidine Gluconate Cloth  6 each Topical Daily   insulin aspart  0-5 Units Subcutaneous QHS   insulin aspart  0-9 Units Subcutaneous TID WC   insulin glargine-yfgn  16 Units Subcutaneous QHS   lactulose  20 g Oral BID   mouth rinse  15 mL Mouth Rinse BID   metoprolol succinate  25 mg Oral Daily   pantoprazole  40 mg Oral Daily   polyethylene glycol  17 g Oral Daily   sildenafil  20 mg Oral TID   tamsulosin  0.4 mg Oral Daily   Warfarin - Pharmacist Dosing Inpatient   Does not apply q1600    Infusions:  furosemide (LASIX) 200 mg in dextrose 5% 100 mL (62m/mL) infusion 15 mg/hr (11/26/20 0235)   milrinone 0.125 mcg/kg/min (11/26/20 0328)    PRN Medications: acetaminophen, alum & mag hydroxide-simeth, diphenhydrAMINE, guaiFENesin-dextromethorphan, hydrocortisone cream,  melatonin   Assessment/Plan:     1. Cor pulmonale/RV failure: Suspect primarily due to OHS/OSA. Echo in 4/21 with EF 55-60%, RV severely dilated w/ severe RV dysfunction in the setting of severe pulmonary hypertension w/ PASP 76 mmHg. [place]/21 with elevated filling pressures, mixed pulmonary venous/pulmonary hypertension=> diuresed w/ IV Lasix in hospital and placed on po torsemide. Echo in 11/22 showed EF 55-60%, mod LVH, D-shaped septum, severe RV enlargement with severely decreased RV systolic function, IVC dilated. NYHA class IV. On milrinone 0.125, lasix gtt at 15 and diamox 250 bid.  Weight down 0.5 pound overnight Remains volume overloaded on exam with weight up significantly from baseline.   Co-ox 58% CVP 20  Creatinine stable at 2.5 - Continue milrinone - Increase lasix to 20/hr. Give dose of metolazone 2.5 - Supp K - Follow BMET closely.  2. Pulmonary HTN: PASP elevated at 76 mmHg  by 4/21 echo estimate with RV systolic function severely reduced. Most likely poorly controlled OHS/OSA as cause (group 3 PH).  V/Q scan negative for chronic PE.  Mixed PVH/PAH on RHC.  Echo in 10/22 with severe RV failure and again this admission.   - Continue sildenafil 20 mg tid.  - Continue home oxygen and CPAP at night.    3. Stage IIIb CKD: Creatinine stable at 2.5.  - Daily BMET  4. Atrial fibrillation: Chronic, unlikely to be able to successfully cardiovert given long-standing atrial fibrillation. Rate still mildly elevated - Continue Toprol XL 50 mg daily.   - Continue warfarin, consider transition to [place] (will need to see why he is on warfarin instead).  5. OSA/OHS: Continue CPAP at night (needs to use) and continue oxygen during the day.  6. Diabetes: - Management per primary team.  7. MGUS: Previously followed by hem/onc but has not seen in several years.  Multiple myeloma panel 4/21 with presence of M-spike. - Followup with heme/onc.  8. Cirrhosis: ?Due to NAFLD. If creatinine remains stable  with diuretic adjustment, will try to add spironolactone tomorrow.  Paracentesis 2.4 L on 11/22.    Length of Stay: 4   Glori Bickers MD 11/26/2020, 10:44 AM  Advanced Heart Failure Team Pager [phone] (M-F; [place])  Please contact [facility] for night-coverage after hours (4p -7a ) and weekends on [email or web address]

## 2020-11-27 ENCOUNTER — Inpatient Hospital Stay (HOSPITAL_COMMUNITY): Payer: Medicare Other

## 2020-11-27 DIAGNOSIS — I5033 Acute on chronic diastolic (congestive) heart failure: Secondary | ICD-10-CM | POA: Diagnosis not present

## 2020-11-27 DIAGNOSIS — I4891 Unspecified atrial fibrillation: Secondary | ICD-10-CM | POA: Diagnosis not present

## 2020-11-27 DIAGNOSIS — K7581 Nonalcoholic steatohepatitis (NASH): Secondary | ICD-10-CM | POA: Diagnosis not present

## 2020-11-27 DIAGNOSIS — I509 Heart failure, unspecified: Secondary | ICD-10-CM | POA: Diagnosis not present

## 2020-11-27 DIAGNOSIS — N1832 Chronic kidney disease, stage 3b: Secondary | ICD-10-CM | POA: Diagnosis not present

## 2020-11-27 DIAGNOSIS — N179 Acute kidney failure, unspecified: Secondary | ICD-10-CM | POA: Diagnosis not present

## 2020-11-27 DIAGNOSIS — I50810 Right heart failure, unspecified: Secondary | ICD-10-CM | POA: Diagnosis not present

## 2020-11-27 DIAGNOSIS — I272 Pulmonary hypertension, unspecified: Secondary | ICD-10-CM | POA: Diagnosis not present

## 2020-11-27 LAB — POCT I-STAT 7, (LYTES, BLD GAS, ICA,H+H)
Acid-Base Excess: 4 mmol/L — ABNORMAL HIGH (ref 0.0–2.0)
Bicarbonate: 31.5 mmol/L — ABNORMAL HIGH (ref 20.0–28.0)
Calcium, Ion: 1.17 mmol/L (ref 1.15–1.40)
HCT: 31 % — ABNORMAL LOW (ref 39.0–52.0)
Hemoglobin: 10.5 g/dL — ABNORMAL LOW (ref 13.0–17.0)
O2 Saturation: 84 %
Patient temperature: 96.9
Potassium: 4.3 mmol/L (ref 3.5–5.1)
Sodium: 137 mmol/L (ref 135–145)
TCO2: 33 mmol/L — ABNORMAL HIGH (ref 22–32)
pCO2 arterial: 59.6 mmHg — ABNORMAL HIGH (ref 32.0–48.0)
pH, Arterial: 7.326 — ABNORMAL LOW (ref 7.350–7.450)
pO2, Arterial: 51 mmHg — ABNORMAL LOW (ref 83.0–108.0)

## 2020-11-27 LAB — BASIC METABOLIC PANEL
Anion gap: 7 (ref 5–15)
BUN: 72 mg/dL — ABNORMAL HIGH (ref 8–23)
CO2: 33 mmol/L — ABNORMAL HIGH (ref 22–32)
Calcium: 8.3 mg/dL — ABNORMAL LOW (ref 8.9–10.3)
Chloride: 95 mmol/L — ABNORMAL LOW (ref 98–111)
Creatinine, Ser: 2.82 mg/dL — ABNORMAL HIGH (ref 0.61–1.24)
GFR, Estimated: 24 mL/min — ABNORMAL LOW (ref >60)
Glucose, Bld: 172 mg/dL — ABNORMAL HIGH (ref 70–99)
Potassium: 4.4 mmol/L (ref 3.5–5.1)
Sodium: 135 mmol/L (ref 135–145)

## 2020-11-27 LAB — BLOOD GAS, ARTERIAL
Acid-Base Excess: 4.4 mmol/L — ABNORMAL HIGH (ref 0.0–2.0)
Bicarbonate: 31.3 mmol/L — ABNORMAL HIGH (ref 20.0–28.0)
Drawn by: 336832
FIO2: 44
O2 Saturation: 95.3 %
Patient temperature: 36.9
pCO2 arterial: 74.7 mmHg (ref 32.0–48.0)
pH, Arterial: 7.245 — ABNORMAL LOW (ref 7.350–7.450)
pO2, Arterial: 81.9 mmHg — ABNORMAL LOW (ref 83.0–108.0)

## 2020-11-27 LAB — COOXEMETRY PANEL
Carboxyhemoglobin: 1.9 % — ABNORMAL HIGH (ref 0.5–1.5)
Methemoglobin: 0.7 % (ref 0.0–1.5)
O2 Saturation: 69.3 %
Total hemoglobin: 8.2 g/dL — ABNORMAL LOW (ref 12.0–16.0)

## 2020-11-27 LAB — RENAL FUNCTION PANEL
Albumin: 2.8 g/dL — ABNORMAL LOW (ref 3.5–5.0)
Anion gap: 6 (ref 5–15)
BUN: 62 mg/dL — ABNORMAL HIGH (ref 8–23)
CO2: 32 mmol/L (ref 22–32)
Calcium: 8 mg/dL — ABNORMAL LOW (ref 8.9–10.3)
Chloride: 97 mmol/L — ABNORMAL LOW (ref 98–111)
Creatinine, Ser: 2.43 mg/dL — ABNORMAL HIGH (ref 0.61–1.24)
GFR, Estimated: 28 mL/min — ABNORMAL LOW (ref >60)
Glucose, Bld: 127 mg/dL — ABNORMAL HIGH (ref 70–99)
Phosphorus: 4.7 mg/dL — ABNORMAL HIGH (ref 2.5–4.6)
Potassium: 4.2 mmol/L (ref 3.5–5.1)
Sodium: 135 mmol/L (ref 135–145)

## 2020-11-27 LAB — CBC
HCT: 30.6 % — ABNORMAL LOW (ref 39.0–52.0)
Hemoglobin: 8.2 g/dL — ABNORMAL LOW (ref 13.0–17.0)
MCH: 24.9 pg — ABNORMAL LOW (ref 26.0–34.0)
MCHC: 26.8 g/dL — ABNORMAL LOW (ref 30.0–36.0)
MCV: 93 fL (ref 80.0–100.0)
Platelets: 98 10*3/uL — ABNORMAL LOW (ref 150–400)
RBC: 3.29 MIL/uL — ABNORMAL LOW (ref 4.22–5.81)
RDW: 19.3 % — ABNORMAL HIGH (ref 11.5–15.5)
WBC: 5 10*3/uL (ref 4.0–10.5)
nRBC: 0.4 % — ABNORMAL HIGH (ref 0.0–0.2)

## 2020-11-27 LAB — PROTIME-INR
INR: 2.8 — ABNORMAL HIGH (ref 0.8–1.2)
Prothrombin Time: 29.3 seconds — ABNORMAL HIGH (ref 11.4–15.2)

## 2020-11-27 LAB — GLUCOSE, CAPILLARY
Glucose-Capillary: 180 mg/dL — ABNORMAL HIGH (ref 70–99)
Glucose-Capillary: 158 mg/dL — ABNORMAL HIGH (ref 70–99)
Glucose-Capillary: 110 mg/dL — ABNORMAL HIGH (ref 70–99)
Glucose-Capillary: 138 mg/dL — ABNORMAL HIGH (ref 70–99)
Glucose-Capillary: 144 mg/dL — ABNORMAL HIGH (ref 70–99)

## 2020-11-27 LAB — LACTIC ACID, PLASMA: Lactic Acid, Venous: 0.7 mmol/L (ref 0.5–1.9)

## 2020-11-27 LAB — MAGNESIUM: Magnesium: 2.6 mg/dL — ABNORMAL HIGH (ref 1.7–2.4)

## 2020-11-27 LAB — CYTOLOGY - NON PAP

## 2020-11-27 LAB — AMMONIA: Ammonia: 49 umol/L — ABNORMAL HIGH (ref 9–35)

## 2020-11-27 MED ORDER — PRISMASOL BGK 4/2.5 32-4-2.5 MEQ/L REPLACEMENT SOLN: Status: DC

## 2020-11-27 MED ORDER — SODIUM CHLORIDE 0.9 % IV SOLN
250.0000 mg | Freq: Every day | INTRAVENOUS | Status: AC
  Administered 2020-11-27 – 2020-11-30 (×4): 250 mg via INTRAVENOUS
  Filled 2020-11-27 (×6): qty 20

## 2020-11-27 MED ORDER — DARBEPOETIN ALFA 100 MCG/0.5ML IJ SOSY
100.0000 ug | PREFILLED_SYRINGE | INTRAMUSCULAR | Status: DC
  Administered 2020-11-29 – 2020-12-06 (×2): 100 ug via SUBCUTANEOUS
  Filled 2020-11-27 (×3): qty 0.5

## 2020-11-27 MED ORDER — NOREPINEPHRINE 4 MG/250ML-% IV SOLN
0.0000 ug/min | INTRAVENOUS | Status: DC
  Administered 2020-11-28: 6 ug/min via INTRAVENOUS
  Filled 2020-11-27: qty 250

## 2020-11-27 MED ORDER — HEPARIN SODIUM (PORCINE) 1000 UNIT/ML DIALYSIS
1000.0000 [IU] | INTRAMUSCULAR | Status: DC | PRN
  Administered 2020-11-29 – 2020-12-04 (×2): 2400 [IU] via INTRAVENOUS_CENTRAL
  Administered 2020-12-05: 1000 [IU] via INTRAVENOUS_CENTRAL
  Filled 2020-11-27: qty 6
  Filled 2020-11-27: qty 3
  Filled 2020-11-27 (×4): qty 6
  Filled 2020-11-27: qty 4
  Filled 2020-11-27: qty 3

## 2020-11-27 MED ORDER — SODIUM CHLORIDE 0.9 % IV SOLN: INTRAVENOUS | Status: DC | PRN

## 2020-11-27 MED ORDER — HEPARIN (PORCINE) 2000 UNITS/L FOR CRRT: INTRAVENOUS_CENTRAL | Status: DC | PRN

## 2020-11-27 MED ORDER — PRISMASOL BGK 4/2.5 32-4-2.5 MEQ/L EC SOLN: Status: DC

## 2020-11-27 MED ORDER — NOREPINEPHRINE 4 MG/250ML-% IV SOLN
INTRAVENOUS | Status: AC
  Administered 2020-11-27: 2 ug/min via INTRAVENOUS
  Filled 2020-11-27: qty 250

## 2020-11-27 NOTE — Progress Notes (Signed)
   11/27/20 0700  Assess: MEWS Score  Temp 98.7 F (37.1 C)  BP 114/80  ECG Heart Rate (!) 114  Resp 17  Level of Consciousness Alert  SpO2 100 %  O2 Device Nasal Cannula  O2 Flow Rate (L/min) 4 L/min  Assess: MEWS Score  MEWS Temp 0  MEWS Systolic 0  MEWS Pulse 2  MEWS RR 0  MEWS LOC 0  MEWS Score 2  MEWS Score Color Yellow  Assess: if the MEWS score is Yellow or Red  Were vital signs taken at a resting state? Yes  Focused Assessment Change from prior assessment (see assessment flowsheet)  Early Detection of Sepsis Score *See Row Information* Low  MEWS guidelines implemented *See Row Information* Yes  Treat  Pain Scale 0-10  Pain Score 0  Take Vital Signs  Increase Vital Sign Frequency  Yellow: Q 2hr X 2 then Q 4hr X 2, if remains yellow, continue Q 4hrs  Escalate  MEWS: Escalate Yellow: discuss with charge nurse/RN and consider discussing with provider and RRT  Notify: Charge Nurse/RN  Name of Charge Nurse/RN Notified [place]  Date Charge Nurse/RN Notified 11/27/20  Time Charge Nurse/RN Notified 0700  Notify: Provider  Provider Name/Title Dr. Broadus John  Date Provider Notified 11/27/20  Time Provider Notified 0700  Notification Type Call  Notification Reason Change in status  Provider response See new orders  Date of Provider Response 11/27/20  Time of Provider Response 0700

## 2020-11-27 NOTE — Progress Notes (Signed)
Patient complained of double vision after waking up from sleep. Pupils equal and reactive to light. Patient able to track light in all directions. No complaints of pain. Patient fell asleep after I left room for brief time. Upon re-awakening patient said vision was ok. Will pass info to MD in am and continue to monitor patient.

## 2020-11-27 NOTE — Progress Notes (Addendum)
Patient transferred to 2H02, report given to charge RN. Mr. Brunkhorst did not want me to call anyone to update them on his transfer.   [name] called and updated on patient being moved to ICU.

## 2020-11-27 NOTE — Consult Note (Signed)
Reason for Consult: Acute kidney injury on chronic kidney disease stage IIIb Referring Physician: Domenic Polite, MD [facility])  HPI:  70 year old man with a history of chronic atrial fibrillation on anticoagulation with Coumadin, history of colon cancer status postresection in 2002, chronic obstructive lung disease on oxygen supplementation, history of diastolic heart failure, cirrhosis and baseline chronic kidney disease stage IIIb - IV (baseline creatinine 2.0-2.5 followed by Dr. Theador Hawthorne in [place]).  He appears to have had at least 2 episodes of significant acute kidney injury requiring renal replacement therapy in the past.  He was transferred to [facility] from [facility] for management by the advanced heart failure service after admitted there with decompensated diastolic heart failure and failure to respond to an escalating diuretic regimen at home.  He has had at least 30 pound weight gain over the last 3 months or so.  He underwent paracentesis for the management of ascites with 2.4 L volume removal.  Concern is noted with decreasing urine output and worsening renal function in the setting of ongoing diuresis.  He was noted to be increasingly encephalopathic this morning with continued need for oxygen supplementation.   11/23/20 04:22 11/24/20 06:20 11/25/20 02:55 11/26/20 04:50 11/27/20 05:00  BUN 64 (H) 68 (H) 71 (H) 68 (H) 72 (H)  Creatinine 2.28 (H) 2.43 (H) 2.48 (H) 2.46 (H) 2.82 (H)   Past Medical History:  Diagnosis Date   CAD (coronary artery disease)    LHC 2/08:  mRCA 50%, o/w no sig CAD, EF 25%   Cardiomyopathy ([place])    EF 35-40% in 2/16 in setting of AF with RVR >> echo 5/16 with improved LVF with EF 65-70%   Chronic atrial fibrillation (HCC)    Chronic diastolic heart failure (HCC)    HFPF - EF ~65-70% (OFTEN EXACERBATED BY AFIB)   CKD (chronic kidney disease)    hx of worsening renal failure and hyperK+ in setting of acute diast CHF >> required CVVHD in 4/16 and dialysis x  1 in 5/16   CKD stage 3 due to type 2 diabetes mellitus ([place]) 09/17/2015   Colon cancer ([place])    Colon polyps 09/21/2012   Tubular adenoma   Diabetes ([place])    Hyperlipidemia    Hypertension    Obesity hypoventilation syndrome ([place])    Renal insufficiency    Sleep apnea     Past Surgical History:  Procedure Laterality Date   CIRCUMCISION     COLON RESECTION     COLONOSCOPY N/A 09/21/2012   Procedure: COLONOSCOPY;  Surgeon: Inda Castle, MD;  Location: [facility] ENDOSCOPY;  Service: Endoscopy;  Laterality: N/A;   COLONOSCOPY N/A 12/28/2018   Procedure: COLONOSCOPY;  Surgeon: Rogene Houston, MD;  Location: [facility] ENDO [facility];  Service: Endoscopy;  Laterality: N/A;   COLONOSCOPY WITH PROPOFOL N/A 10/17/2020   Procedure: COLONOSCOPY WITH PROPOFOL;  Surgeon: Harvel Quale, MD;  Location: [facility] ENDO SUITE;  Service: Gastroenterology;  Laterality: N/A;   ENTEROSCOPY N/A 06/24/2020   Procedure: ENTEROSCOPY;  Surgeon: Gatha Mayer, MD;  Location: [facility] ENDOSCOPY;  Service: Endoscopy;  Laterality: N/A;   ESOPHAGOGASTRODUODENOSCOPY N/A 12/28/2018   Procedure: ESOPHAGOGASTRODUODENOSCOPY (EGD);  Surgeon: Rogene Houston, MD;  Location: [facility] ENDO [facility];  Service: Endoscopy;  Laterality: N/A;   POLYPECTOMY  10/17/2020   Procedure: POLYPECTOMY;  Surgeon: Harvel Quale, MD;  Location: [facility] END[facility];  Service: Gastroenterology;;  rectal, transverse, descending, ascending, sigmoid   RIGHT HEART CATH N/A 04/11/2019   Procedure: RIGHT HEART CATH;  Surgeon: Larey Dresser, MD;  Location: [facility] LAB;  Service: Cardiovascular;  Laterality: N/A;   US ECHOCARDIOGRAPHY  03/04/2010   abnormal study    Family History  Problem Relation Age of Onset   Breast cancer Mother    Diabetes Mother    Heart attack Father     Social History:  reports that he quit smoking about 37 years ago. His smoking use included cigarettes. He has never used smokeless tobacco. He reports that he does not drink  alcohol and does not use drugs.  Allergies:  Allergies  Allergen Reactions   Codeine     Headache     Medications: I have reviewed the patient's current medications. Scheduled:  allopurinol  300 mg Oral Daily   atorvastatin  20 mg Oral Daily   Chlorhexidine Gluconate Cloth  6 each Topical Daily   insulin aspart  0-5 Units Subcutaneous QHS   insulin aspart  0-9 Units Subcutaneous TID WC   insulin glargine-yfgn  16 Units Subcutaneous QHS   lactulose  20 g Oral BID   mouth rinse  15 mL Mouth Rinse BID   metoprolol succinate  25 mg Oral Daily   pantoprazole  40 mg Oral Daily   polyethylene glycol  17 g Oral Daily   sildenafil  20 mg Oral TID   sodium chloride flush  10-40 mL Intracatheter Q12H   tamsulosin  0.4 mg Oral Daily   Warfarin - Pharmacist Dosing Inpatient   Does not apply q1600   BMP Latest Ref Rng & Units 11/27/2020 11/26/2020 11/25/2020  Glucose 70 - 99 mg/dL 172(H) 210(H) 171(H)  BUN 8 - 23 mg/dL 72(H) 68(H) 71(H)  Creatinine 0.61 - 1.24 mg/dL 2.82(H) 2.46(H) 2.48(H)  BUN/Creat Ratio 6 - 22 (calc) - - -  Sodium 135 - 145 mmol/L 135 135 136  Potassium 3.5 - 5.1 mmol/L 4.4 3.8 4.5  Chloride 98 - 111 mmol/L 95(L) 97(L) 97(L)  CO2 22 - 32 mmol/L 33(H) 31 30  Calcium 8.9 - 10.3 mg/dL 8.3(L) 8.1(L) 8.6(L)   CBC Latest Ref Rng & Units 11/27/2020 11/26/2020 11/25/2020  WBC 4.0 - 10.5 K/uL 5.0 4.8 4.2  Hemoglobin 13.0 - 17.0 g/dL 8.2(L) 7.7(L) 8.6(L)  Hematocrit 39.0 - 52.0 % 30.6(L) 28.6(L) 31.5(L)  Platelets 150 - 400 K/uL 98(L) 94(L) 91(L)    DG CHEST PORT 1 VIEW  Result Date: 11/27/2020 CLINICAL DATA:  Dyspnea. Cough and congestion and shortness of breath. EXAM: PORTABLE CHEST 1 VIEW COMPARISON:  11/25/2020 and older studies. FINDINGS: Cardiac silhouette is borderline enlarged. No mediastinal or hilar masses. There is hazy opacity throughout right hemithorax most prominent at the base, consistent with a combination of layering pleural fluid and airspace lung  opacity. This has increased from the prior exam. There is also opacity at the left lung base, not well defined, consistent with atelectasis with a probable small effusion. Mid to upper left lung is clear. No pneumothorax. Right-sided PICC is stable. IMPRESSION: 1. Interval worsening of lung aeration on the right, although the apparent change may be due to differences in patient positioning and technique. Right-sided opacity is consistent with a combination of layering right pleural fluid and parenchymal lung opacity, the latter finding which may reflect asymmetric edema or pneumonia. 2. Mild stable opacity at the left lung base consistent with atelectasis with a suspected small effusion. Electronically Signed   By: Lajean Manes M.D.   On: 11/27/2020 10:24   DG CHEST PORT 1 VIEW  Result Date:  11/25/2020 CLINICAL DATA:  PICC line placement EXAM: PORTABLE CHEST 1 VIEW COMPARISON:  11/22/2020 FINDINGS: Right upper extremity central venous catheter tip projects over SVC origin. Cardiomegaly with vascular congestion, pulmonary edema and pleural effusions without significant change. Aortic atherosclerosis. IMPRESSION: 1. Right upper extremity central venous catheter tip overlies the SVC origin 2. Cardiomegaly with continued vascular congestion, edema and pleural effusions. Electronically Signed   By: Donavan Foil M.D.   On: 11/25/2020 22:30    Review of Systems  Unable to perform ROS: Acuity of condition (Able to answer focused questions but largely lethargic)  Respiratory:  Positive for shortness of breath.   Gastrointestinal:  Negative for abdominal pain.  Blood pressure 130/70, pulse (!) 107, temperature 98.4 F (36.9 C), temperature source Oral, resp. rate 20, height 5' 7"  (1.702 m), weight 129.1 kg, SpO2 99 %. Physical Exam Vitals reviewed.  Constitutional:      General: He is in acute distress.     Appearance: He is obese. He is ill-appearing.  HENT:     Head: Normocephalic and atraumatic.      Right Ear: External ear normal.     Left Ear: External ear normal.     Nose: Nose normal.     Mouth/Throat:     Mouth: Mucous membranes are dry.     Pharynx: Oropharynx is clear.  Eyes:     General: No scleral icterus.    Extraocular Movements: Extraocular movements intact.     Conjunctiva/sclera: Conjunctivae normal.  Cardiovascular:     Rate and Rhythm: Tachycardia present. Rhythm irregular.     Heart sounds: Normal heart sounds.  Pulmonary:     Breath sounds: Rales present. No wheezing or rhonchi.  Abdominal:     General: There is distension.     Palpations: Abdomen is soft.     Comments: Moderate global distention without tenderness  Musculoskeletal:     Cervical back: Normal range of motion.     Right lower leg: Edema present.     Left lower leg: Edema present.     Comments: Both legs in [place] boots, 2+ edema over thighs  Skin:    General: Skin is warm and dry.     Coloration: Skin is pale.     Findings: No rash.  Neurological:     Mental Status: He is disoriented.     Motor: Weakness present.  Psychiatric:     Comments: Lethargic    Assessment/Plan: 1.  Acute kidney injury on chronic kidney disease stage IIIb: Likely hemodynamically mediated in the setting of acute exacerbation of congestive heart failure.  With limited response to diuretic therapy at this time and impending respiratory failure for which I recommend transfer to the ICU to initiate CRRT for extracorporeal volume unloading and management of azotemia.  Discontinue heparin drip after CRRT started.  Avoid iodinated intravenous contrast and NSAIDs.  Monitor blood pressures closely to assess for need for pressors. 2.  Acute exacerbation of diastolic heart failure: With severe pulmonary hypertension and significant third spacing/volume overload as corroborated by elevated CVP.  Unfortunately refractory to diuretic therapy so far and further limited by worsening renal function.  Will undertake extracorporeal volume  unloading/ultrafiltration. 3.  Encephalopathy: Likely multifactorial with underlying cirrhosis-ongoing evaluation with ammonia level.  Other differentials include uremia from worsening azotemia and hypercapnic respiratory failure/CO2 narcosis.   4.  Anemia of chronic illness: He has severe iron deficiency with an iron saturation of 4% and ferritin of 31.  We will treat with ferric  gluconate and begin him on Aranesp. 5.  Chronic atrial fibrillation: Intermittent tachycardia noted on metoprolol.  On anticoagulation with Coumadin 6.  Thrombocytopenia: Likely from splenic sequestration in the setting of cirrhosis, no evidence of bleeding diathesis.  Farryn Linares K. 11/27/2020, 10:40 AM

## 2020-11-27 NOTE — Progress Notes (Addendum)
Advanced Heart Failure Rounding Note   Subjective:    On milrinone 0.125. Co-ox 69%   Lasix gtt increased to 20/hr yesterday. Also on diamox and received metolazone 2.5  Only 750cc of urine charted. Weight up 5 pounds (he drinks a lot of fluids)  SCr 2.4 -> 2.8  Hard to arouse.     Objective:   Weight Range:  Vital Signs:   Temp:  [97.5 F (36.4 C)-98.4 F (36.9 C)] 98.4 F (36.9 C) (11/25 0600) Pulse Rate:  [97-107] 107 (11/24 1607) Resp:  [20] 20 (11/25 0000) BP: (117-130)/(59-70) 130/70 (11/24 1607) SpO2:  [93 %-99 %] 99 % (11/25 0600) Weight:  [129.1 kg] 129.1 kg (11/25 0524) Last BM Date: 11/25/20  Weight change: Filed Weights   11/25/20 0000 11/26/20 0317 11/27/20 0524  Weight: 127.1 kg 127 kg 129.1 kg    Intake/Output:   Intake/Output Summary (Last 24 hours) at 11/27/2020 0840 Last data filed at 11/26/2020 2348 Gross per 24 hour  Intake 797.91 ml  Output 750 ml  Net 47.91 ml    CVP 14-15 personally checked.  General:  Lethargic Difficult to arouse.  HEENT: normal Neck: supple. Difficult to assess.  Carotids 2+ bilat; no bruits. No lymphadenopathy or thryomegaly appreciated. Cor: PMI nondisplaced. Irregular rate & rhythm. No rubs, gallops. 2/6 TR . Lungs: Crackles on 4 liters [place].  Abdomen: obese, soft, nontender, distended. No hepatosplenomegaly. No bruits or masses. Good bowel sounds. Extremities: no cyanosis, clubbing, rash, 3+ edema Neuro: Difficult to assess. Oriented to self.  cranial nerves grossly intact. moves all 4 extremities w/o difficulty.   Telemetry: AF 90-100s   Labs: Basic Metabolic Panel: Recent Labs  Lab 11/22/20 0424 11/23/20 0422 11/24/20 0620 11/25/20 0255 11/26/20 0450 11/27/20 0500  NA 140 138 138 136 135 135  K 4.2 4.3 4.4 4.5 3.8 4.4  CL 101 100 99 97* 97* 95*  CO2 30 31 31 30 31  33*  GLUCOSE 134* 137* 132* 171* 210* 172*  BUN 58* 64* 68* 71* 68* 72*  CREATININE 2.08* 2.28* 2.43* 2.48* 2.46* 2.82*   CALCIUM 8.6* 8.5* 8.7* 8.6* 8.1* 8.3*  MG 2.5*  --   --   --  2.4  --   PHOS 4.6  --   --   --   --   --      Liver Function Tests: Recent Labs  Lab 11/22/20 0424 11/23/20 0422  AST 19 16  ALT 16 14  ALKPHOS 79 70  BILITOT 0.9 1.0  PROT 6.8 6.4*  ALBUMIN 3.4* 3.2*    No results for input(s): LIPASE, AMYLASE in the last 168 hours. Recent Labs  Lab 11/22/20 0425 11/25/20 0945  AMMONIA 35 48*     CBC: Recent Labs  Lab 11/22/20 0424 11/23/20 0422 11/24/20 0620 11/25/20 0255 11/26/20 0450 11/27/20 0500  WBC 4.8 5.6 4.7 4.2 4.8 5.0  NEUTROABS 3.6  --   --   --   --   --   HGB 8.9* 9.0* 9.5* 8.6* 7.7* 8.2*  HCT 32.8* 33.6* 35.0* 31.5* 28.6* 30.6*  MCV 94.3 94.1 95.1 93.2 92.6 93.0  PLT 102* 103* 94* 91* 94* 98*     Cardiac Enzymes: No results for input(s): CKTOTAL, CKMB, CKMBINDEX, TROPONINI in the last 168 hours.  BNP: BNP (last 3 results) Recent Labs    10/17/20 0428 11/20/20 1128 11/22/20 0425  BNP 290.0* 306.1* 270.0*     ProBNP (last 3 results) No results for input(s): PROBNP in  the last 8760 hours.    Other results:  Imaging: DG CHEST PORT 1 VIEW  Result Date: 11/25/2020 CLINICAL DATA:  PICC line placement EXAM: PORTABLE CHEST 1 VIEW COMPARISON:  11/22/2020 FINDINGS: Right upper extremity central venous catheter tip projects over SVC origin. Cardiomegaly with vascular congestion, pulmonary edema and pleural effusions without significant change. Aortic atherosclerosis. IMPRESSION: 1. Right upper extremity central venous catheter tip overlies the SVC origin 2. Cardiomegaly with continued vascular congestion, edema and pleural effusions. Electronically Signed   By: Donavan Foil M.D.   On: 11/25/2020 22:30   [place] EKG SITE RITE  Result Date: 11/25/2020 If Site Rite image not attached, placement could not be confirmed due to current cardiac rhythm.    Medications:     Scheduled Medications:  allopurinol  300 mg Oral Daily   atorvastatin   20 mg Oral Daily   Chlorhexidine Gluconate Cloth  6 each Topical Daily   insulin aspart  0-5 Units Subcutaneous QHS   insulin aspart  0-9 Units Subcutaneous TID WC   insulin glargine-yfgn  16 Units Subcutaneous QHS   lactulose  20 g Oral BID   mouth rinse  15 mL Mouth Rinse BID   metoprolol succinate  25 mg Oral Daily   pantoprazole  40 mg Oral Daily   polyethylene glycol  17 g Oral Daily   sildenafil  20 mg Oral TID   sodium chloride flush  10-40 mL Intracatheter Q12H   tamsulosin  0.4 mg Oral Daily   Warfarin - Pharmacist Dosing Inpatient   Does not apply q1600    Infusions:  furosemide (LASIX) 200 mg in dextrose 5% 100 mL (17m/mL) infusion 20 mg/hr (11/26/20 2348)   milrinone 0.125 mcg/kg/min (11/26/20 0328)    PRN Medications: acetaminophen, alum & mag hydroxide-simeth, diphenhydrAMINE, guaiFENesin-dextromethorphan, hydrocortisone cream, melatonin, sodium chloride flush   Assessment/Plan:     1. Cor pulmonale/RV failure: Suspect primarily due to OHS/OSA. Echo in 4/21 with EF 55-60%, RV severely dilated w/ severe RV dysfunction in the setting of severe pulmonary hypertension w/ PASP 76 mmHg. [place] with elevated filling pressures, mixed pulmonary venous/pulmonary hypertension=> diuresed w/ IV Lasix in hospital and placed on po torsemide. Echo in 11/22 showed EF 55-60%, mod LVH, D-shaped septum, severe RV enlargement with severely decreased RV systolic function, IVC dilated. NYHA class IV. On milrinone 0.125, lasix gtt at 20  CVP 14-15 Creatinine trending up 2.5>2.8  - Continue milrinone. CO-OX 69%.  - K 3.8  2. Pulmonary HTN: PASP elevated at 76 mmHg by 4/21 echo estimate with RV systolic function severely reduced. Most likely poorly controlled OHS/OSA as cause (group 3 PH).  V/Q scan negative for chronic PE.  Mixed PVH/PAH on RHC.  Echo in 10/22 with severe RV failure and again this admission.   - Continue sildenafil 20 mg tid.  - Continue home oxygen and CPAP at night.    -  Check ABG 3. Stage IIIb CKD: Creatinine trending up 2.5>2.7  - Daily BMET  4. Atrial fibrillation: Chronic, unlikely to be able to successfully cardiovert given long-standing atrial fibrillation.  - Continue Toprol XL 50 mg daily.   - Continue warfarin, consider transition to DMoffat(will need to see why he is on warfarin instead).  5. OSA/OHS: Continue CPAP at night (needs to use) and continue oxygen during the day.  6. Diabetes: - Management per primary team.  7. MGUS: Previously followed by hem/onc but has not seen in several years.  Multiple myeloma panel 4/21 with  presence of M-spike. - Followup with heme/onc.  8. Cirrhosis: ?Due to NAFLD. HOld off on spiro.  - 11/23 Ammonia 48 -  Paracentesis 2.4 L . On lactulose. on 11/22. 9. Anemia  Hgb 7.7. No obvious source.  - anemia panel. Iron sats 4.Iron 17.  - Nephrology consulted.   Lethargic difficult to arouse. ? [place]ic. No sedation meds over night. CXR, ABG, and lactic acid now.     Length of Stay: 5  Amy Clegg NP-C  9:26 AM  Glori Bickers MD 11/27/2020, 8:40 AM  Advanced Heart Failure Team Pager [phone] (M-F; [place])  Please contact [facility] for night-coverage after hours (4p -7a ) and weekends on [email or web address]  Agree with above.  He is markedly volume overloaded. Poor response to high-dose diuretics. Appears to be getting uremic. + SOB  General:  Lying in bed. Lethargic HEENT: normal Neck: supple. JVP to ear Carotids 2+ bilat; no bruits. No lymphadenopathy or thryomegaly appreciated. Cor: PMI nondisplaced. Irregular rate & rhythm. No rubs, gallops or murmurs. Lungs: clear Abdomen: obese soft, nontender, nondistended. No hepatosplenomegaly. No bruits or masses. Good bowel sounds. Extremities: no cyanosis, clubbing, rash, 3+ edema + UNNA Neuro: lethargic. Moves all 4.   He is uremic and massively volume overloaded. Poor response to IV milrinone and high-dose diuretics. CXR with worsening edema. He will need  CVVHD.  D/w TRH, CCM and Renal.   Check ABG, lactate. Will move to ICU for CVVHD initiation. He is likely poor candidate for long term HD so will need to assess for renal recovery.   CRITICAL CARE Performed by: Glori Bickers  Total critical care time: 35 minutes  Critical care time was exclusive of separately billable procedures and treating other patients.  Critical care was necessary to treat or prevent imminent or life-threatening deterioration.  Critical care was time spent personally by me (independent of midlevel providers or residents) on the following activities: development of treatment plan with patient and/or surrogate as well as nursing, discussions with consultants, evaluation of patient's response to treatment, examination of patient, obtaining history from patient or surrogate, ordering and performing treatments and interventions, ordering and review of laboratory studies, ordering and review of radiographic studies, pulse oximetry and re-evaluation of patient's condition.  Glori Bickers, MD  11:53 AM

## 2020-11-27 NOTE — Procedures (Signed)
Central Venous Catheter Insertion Procedure Note  William Flores  [id number]  1950/03/16  Date:11/27/20  Time:3:11 PM   Provider Performing:Jacy Howat   Procedure: Insertion of Non-tunneled Central Venous Catheter(36556)with US guidance (40981)    Indication(s) Hemodialysis  Consent Risks of the procedure as well as the alternatives and risks of each were explained to the patient and/or caregiver.  Consent for the procedure was obtained and is signed in the bedside chart  Anesthesia Topical only with 1% lidocaine   Timeout Verified patient identification, verified procedure, site/side was marked, verified correct patient position, special equipment/implants available, medications/allergies/relevant history reviewed, required imaging and test results available.  Sterile Technique Maximal sterile technique including full sterile barrier drape, hand hygiene, sterile gown, sterile gloves, mask, hair covering, sterile ultrasound probe cover (if used).  Procedure Description Area of catheter insertion was cleaned with chlorhexidine and draped in sterile fashion.   With real-time ultrasound guidance a HD catheter was placed into the right internal jugular vein.  Nonpulsatile blood flow and easy flushing noted in all ports.  The catheter was sutured in place and sterile dressing applied.  Complications/Tolerance None; patient tolerated the procedure well. Chest X-ray is ordered to verify placement for internal jugular or subclavian cannulation.  Chest x-ray is not ordered for femoral cannulation.  EBL Minimal  Specimen(s) None

## 2020-11-27 NOTE — Consult Note (Signed)
NAME:  William Flores, MRN:  [id number], DOB:  1950-04-29, LOS: 5 ADMISSION DATE:  11/22/2020, CONSULTATION DATE: 11/27/2020 REFERRING MD: [facility], CHIEF COMPLAINT: Volume overload renal failure  History of Present Illness:  William Flores is a 70 year old nursing home resident with a plethora of health issues that acute significant for acute on chronic right heart failure cor pulmonale severe pulmonary hypertension, metabolic encephalopathy, acute on chronic anemia chronic with acute respiratory failure, cirrhosis secondary to Karlene Lineman, chronic atrial fibrillation acute kidney injury, thrombocytopenia diabetes mellitus obstructive sleep apnea who presents with worsening volume overload and pulmonary critical care is asked to transfer to intensive care place a hemodialysis catheter so that he can undergo CRRT.  Pertinent  Medical History   Past Medical History:  Diagnosis Date   CAD (coronary artery disease)    LHC 2/08:  mRCA 50%, o/w no sig CAD, EF 25%   Cardiomyopathy ([place])    EF 35-40% in 2/16 in setting of AF with RVR >> echo 5/16 with improved LVF with EF 65-70%   Chronic atrial fibrillation (HCC)    Chronic diastolic heart failure (HCC)    HFPF - EF ~65-70% (OFTEN EXACERBATED BY AFIB)   CKD (chronic kidney disease)    hx of worsening renal failure and hyperK+ in setting of acute diast CHF >> required CVVHD in 4/16 and dialysis x 1 in 5/16   CKD stage 3 due to type 2 diabetes mellitus ([place]) 09/17/2015   Colon cancer ([place])    Colon polyps 09/21/2012   Tubular adenoma   Diabetes ([place])    Hyperlipidemia    Hypertension    Obesity hypoventilation syndrome ([place])    Renal insufficiency    Sleep apnea      Significant Hospital Events: Including procedures, antibiotic start and stop dates in addition to other pertinent events   11/27/2020 consult to pulmonary critical care 11/27/2020 transfer to intensive care unit place hemodialysis catheter   Interim History / Subjective:  We will  transfer intensive care unit for COVID RT  Objective   Blood pressure 110/65, pulse (!) 107, temperature 98.6 F (37 C), temperature source Oral, resp. rate (!) 22, height 5' 7"  (1.702 m), weight 129.1 kg, SpO2 100 %. CVP:  [3 mmHg-19 mmHg] 3 mmHg      Intake/Output Summary (Last 24 hours) at 11/27/2020 1201 Last data filed at 11/27/2020 6226 Gross per 24 hour  Intake 834.09 ml  Output 950 ml  Net -115.91 ml   Filed Weights   11/25/20 0000 11/26/20 0317 11/27/20 0524  Weight: 127.1 kg 127 kg 129.1 kg    Examination: General: Morbidly obese male appears somewhat obtunded HENT: No JVD is appreciated Lungs: Decreased breath sounds throughout Cardiovascular: Heart sounds are distant Abdomen: Obese soft fluid wave is noted Extremities: 2-3+ edema Neuro: Follows simple commands   Resolved Hospital Problem list     Assessment & Plan:  Acute on chronic heart failure cor pulmonale with severe pulmonary hypertension with worsening volume overload. Transferred to intensive care unit Place hemodialysis catheter CRRT to remove volume overload   OSA Nocturnal and as needed BiPAP  Chronic A. Fib Was on Coumadin for hemodialysis catheter And heparin drip resume Coumadin   Cirrhosis with elevated ammonia level status post paracentesis with 2.4 L removed CRRT May need repeat paracentesis  Chronic anemia Recent Labs    11/26/20 0450 11/27/20 0500  HGB 7.7* 8.2*   Transfuse per protocol   Thrombocytopenia Monitor platelets  Diabetes mellitus   Metabolic encephalopathy  Continue current treatment Monitor LFTs     Best Practice (right click and "Reselect all SmartList Selections" daily)   Diet/type: NPO DVT prophylaxis: other GI prophylaxis: PPI Lines: N/A Foley:  N/A Code Status:  full code Last date of multidisciplinary goals of care discussion [tbd] 11/27/2020 patient is encephalopathic does not show clear insight.  Labs   CBC: Recent Labs  Lab  11/22/20 0424 11/23/20 0422 11/24/20 0620 11/25/20 0255 11/26/20 0450 11/27/20 0500  WBC 4.8 5.6 4.7 4.2 4.8 5.0  NEUTROABS 3.6  --   --   --   --   --   HGB 8.9* 9.0* 9.5* 8.6* 7.7* 8.2*  HCT 32.8* 33.6* 35.0* 31.5* 28.6* 30.6*  MCV 94.3 94.1 95.1 93.2 92.6 93.0  PLT 102* 103* 94* 91* 94* 98*    Basic Metabolic Panel: Recent Labs  Lab 11/22/20 0424 11/23/20 0422 11/24/20 0620 11/25/20 0255 11/26/20 0450 11/27/20 0500  NA 140 138 138 136 135 135  K 4.2 4.3 4.4 4.5 3.8 4.4  CL 101 100 99 97* 97* 95*  CO2 30 31 31 30 31  33*  GLUCOSE 134* 137* 132* 171* 210* 172*  BUN 58* 64* 68* 71* 68* 72*  CREATININE 2.08* 2.28* 2.43* 2.48* 2.46* 2.82*  CALCIUM 8.6* 8.5* 8.7* 8.6* 8.1* 8.3*  MG 2.5*  --   --   --  2.4  --   PHOS 4.6  --   --   --   --   --    GFR: Estimated Creatinine Clearance: 32.4 mL/min (A) (by C-G formula based on SCr of 2.82 mg/dL (H)). Recent Labs  Lab 11/24/20 0620 11/25/20 0255 11/26/20 0450 11/27/20 0500  WBC 4.7 4.2 4.8 5.0    Liver Function Tests: Recent Labs  Lab 11/22/20 0424 11/23/20 0422  AST 19 16  ALT 16 14  ALKPHOS 79 70  BILITOT 0.9 1.0  PROT 6.8 6.4*  ALBUMIN 3.4* 3.2*   No results for input(s): LIPASE, AMYLASE in the last 168 hours. Recent Labs  Lab 11/22/20 0425 11/25/20 0945  AMMONIA 35 48*    ABG    Component Value Date/Time   PHART 7.245 (L) 11/27/2020 1110   PCO2ART 74.7 (HH) 11/27/2020 1110   PO2ART 81.9 (L) 11/27/2020 1110   HCO3 31.3 (H) 11/27/2020 1110   TCO2 42 (H) 04/11/2019 1206   ACIDBASEDEF 1.0 05/16/2014 0929   O2SAT 95.3 11/27/2020 1110     Coagulation Profile: Recent Labs  Lab 11/23/20 0422 11/24/20 0620 11/25/20 0255 11/26/20 0450 11/27/20 0500  INR 3.0* 2.4* 2.5* 3.1* 2.8*    Cardiac Enzymes: No results for input(s): CKTOTAL, CKMB, CKMBINDEX, TROPONINI in the last 168 hours.  HbA1C: HB A1C (BAYER DCA - WAIVED)  Date/Time Value Ref Range Status  03/19/2020 01:11 PM 9.6 (H) <7.0 %  Final    Comment:                                          Diabetic Adult            <7.0                                       Healthy Adult        4.3 - 5.7                                                           (  DCCT/NGSP) American Diabetes Association's Summary of Glycemic Recommendations for Adults with Diabetes: Hemoglobin A1c <7.0%. More stringent glycemic goals (A1c <6.0%) may further reduce complications at the cost of increased risk of hypoglycemia.   12/20/2019 04:15 PM 9.9 (H) <7.0 % Final    Comment:                                          Diabetic Adult            <7.0                                       Healthy Adult        4.3 - 5.7                                                           (DCCT/NGSP) American Diabetes Association's Summary of Glycemic Recommendations for Adults with Diabetes: Hemoglobin A1c <7.0%. More stringent glycemic goals (A1c <6.0%) may further reduce complications at the cost of increased risk of hypoglycemia.    Hgb A1c MFr Bld  Date/Time Value Ref Range Status  10/14/2020 02:39 PM 7.1 (H) 4.8 - 5.6 % Final    Comment:    (NOTE) Pre diabetes:          5.7%-6.4%  Diabetes:              >6.4%  Glycemic control for   <7.0% adults with diabetes   06/22/2020 09:34 AM 8.2 (H) 4.8 - 5.6 % Final    Comment:    (NOTE)         Prediabetes: 5.7 - 6.4         Diabetes: >6.4         Glycemic control for adults with diabetes: <7.0     CBG: Recent Labs  Lab 11/26/20 1142 11/26/20 1602 11/26/20 2129 11/27/20 0657 11/27/20 1159  GLUCAP 158* 150* 169* 180* 158*    Review of Systems:   Na due to encephalopathic state   Past Medical History:  He,  has a past medical history of CAD (coronary artery disease), Cardiomyopathy ([place]), Chronic atrial fibrillation (HCC), Chronic diastolic heart failure ([place]), CKD (chronic kidney disease), CKD stage 3 due to type 2 diabetes mellitus ([place]) (09/17/2015), Colon cancer ([place]), Colon polyps  (09/21/2012), Diabetes ([place]), Hyperlipidemia, Hypertension, Obesity hypoventilation syndrome ([place]), Renal insufficiency, and Sleep apnea.   Surgical History:   Past Surgical History:  Procedure Laterality Date   CIRCUMCISION     COLON RESECTION     COLONOSCOPY N/A 09/21/2012   Procedure: COLONOSCOPY;  Surgeon: Inda Castle, MD;  Location: [facility] END[facility]COPY;  Service: Endoscopy;  Laterality: N/A;   COLONOSCOPY N/A 12/28/2018   Procedure: COLONOSCOPY;  Surgeon: Rogene Houston, MD;  Location: [facility];  Service: Endoscopy;  Laterality: N/A;   COLONOSCOPY WITH PROPOFOL N/A 10/17/2020   Procedure: COLONOSCOPY WITH PROPOFOL;  Surgeon: Harvel Quale, MD;  Location: [facility];  Service: Gastroenterology;  Laterality: N/A;   ENTEROSCOPY N/A 06/24/2020   Procedure: ENTEROSCOPY;  Surgeon: Gatha Mayer, MD;  Location: [facility] ENDOSCOPY;  Service: Endoscopy;  Laterality: N/A;   ESOPHAGOGASTRODUODENOSCOPY N/A 12/28/2018   Procedure: ESOPHAGOGASTRODUODENOSCOPY (EGD);  Surgeon: Rogene Houston, MD;  Location: [facility];  Service: Endoscopy;  Laterality: N/A;   POLYPECTOMY  10/17/2020   Procedure: POLYPECTOMY;  Surgeon: Harvel Quale, MD;  Location: [facility] END[facility];  Service: Gastroenterology;;  rectal, transverse, descending, ascending, sigmoid   RIGHT HEART CATH N/A 04/11/2019   Procedure: RIGHT HEART CATH;  Surgeon: Larey Dresser, MD;  Location: [facility];  Service: Cardiovascular;  Laterality: N/A;   US ECHOCARDIOGRAPHY  03/04/2010   abnormal study     Social History:   reports that he quit smoking about 37 years ago. His smoking use included cigarettes. He has never used smokeless tobacco. He reports that he does not drink alcohol and does not use drugs.   Family History:  His family history includes Breast cancer in his mother; Diabetes in his mother; Heart attack in his father.   Allergies Allergies  Allergen Reactions   Codeine     Headache       Home Medications  Prior to Admission medications   Medication Sig Start Date End Date Taking? Authorizing Provider  allopurinol (ZYLOPRIM) 300 MG tablet Take 1 tablet (300 mg total) by mouth daily. 12/20/19  Yes [name], [name], MD  insulin degludec (TRESIBA) 100 UNIT/ML FlexTouch Pen Inject 24 Units into the skin at bedtime.   Yes [provider]  KLOR-CON M20 20 MEQ tablet Take 0.5 tablets by mouth daily. 10/31/20  Yes [provider]  metoprolol succinate (TOPROL-XL) 50 MG 24 hr tablet Take 75 mg by mouth daily. Takes 1.5 tabs TID (51m 3 times daily)   Yes [provider]  pantoprazole (PROTONIX) 40 MG tablet TAKE 1 TABLET BY MOUTH EVERY DAY 06/02/20  Yes [name], JFransisca Kaufmann MD  sildenafil (REVATIO) 20 MG tablet Take 1 tablet (20 mg total) by mouth 3 (three) times daily. 08/24/20  Yes MLarey Dresser MD  torsemide (DEMADEX) 20 MG tablet Take 3 tablets (60 mg total) by mouth 2 (two) times daily. 09/29/20  Yes MLarey Dresser MD  acetaminophen (TYLENOL) 325 MG tablet Take 2 tablets (650 mg total) by mouth every 6 (six) hours as needed for mild pain (or Fever >/= 101). 01/01/19   ERoxan Hockey MD  atorvastatin (LIPITOR) 20 MG tablet TAKE 1 TABLET BY MOUTH EVERY DAY 03/19/20   [name], JFransisca Kaufmann MD  benzonatate (TESSALON) 100 MG capsule Take 1 capsule (100 mg total) by mouth 2 (two) times daily as needed for cough. 06/25/20   GPatrecia Pour MD  Dulaglutide (TRULICITY) 1.5 MUY/4.0HKSOPN Inject 1.5 mg into the skin once a week. 12/20/19   [name], JFransisca Kaufmann MD  ertugliflozin L-PyroglutamicAc (STEGLATRO) 5 MG TABS tablet Take 5 mg by mouth daily.    [provider]  ferrous sulfate 325 (65 FE) MG tablet Take 325 mg by mouth daily with breakfast.     [provider]  glucose blood (CONTOUR NEXT TEST) test strip Test BS TID Dx E11.29 05/22/20   [name], JFransisca Kaufmann MD  Insulin Glargine (LANTUS SOLOSTAR ) Inject into the skin. 16 units     [provider]  insulin glargine-yfgn (SEMGLEE) 100 UNIT/ML Pen Inject 16 Units into the skin at bedtime.    [provider]  Insulin Pen Needle 32G X 4 MM MISC Use to give insulin daily Dx E11.29 04/15/19   FCharlynne Cousins MD  melatonin 5 MG TABS Take 5  mg by mouth at bedtime as needed.    [provider]  metoprolol succinate (TOPROL-XL) 25 MG 24 hr tablet Take 1 tablet (25 mg total) by mouth daily. 10/25/20   Orson Eva, MD  OXYGEN Inhale into the lungs. Continuous    [provider]  polyethylene glycol (MIRALAX / GLYCOLAX) 17 g packet Take 17 g by mouth daily. 01/01/19   Roxan Hockey, MD  warfarin (COUMADIN) 7.5 MG tablet Take 7.5 mg by mouth daily.    [provider]  diltiazem [facility]) 240 MG 24 hr capsule TAKE 1 CAPSULE DAILY 10/19/12 02/17/13  Vernie Shanks, MD     Critical care time: 35 min    Richardson Landry Rifky Lapre ACNP Acute Care Nurse Practitioner [place] Please consult Amion 11/27/2020, 12:02 PM

## 2020-11-27 NOTE — Progress Notes (Signed)
ANTICOAGULATION CONSULT NOTE  Pharmacy Consult for warfarin Indication: atrial fibrillation  Allergies  Allergen Reactions   Codeine     Headache     Patient Measurements: Height: 5' 7"  (170.2 cm) Weight: 129.1 kg (284 lb 9.8 oz) IBW/kg (Calculated) : 66.1  Vital Signs: Temp: 98.4 F (36.9 C) (11/25 0600) Temp Source: Oral (11/25 0600)  Labs: Recent Labs    11/25/20 0255 11/26/20 0450 11/27/20 0500  HGB 8.6* 7.7* 8.2*  HCT 31.5* 28.6* 30.6*  PLT 91* 94* 98*  LABPROT 26.9* 31.7* 29.3*  INR 2.5* 3.1* 2.8*  CREATININE 2.48* 2.46* 2.82*     Estimated Creatinine Clearance: 32.4 mL/min (A) (by C-G formula based on SCr of 2.82 mg/dL (H)).   Medical History: Past Medical History:  Diagnosis Date   CAD (coronary artery disease)    LHC 2/08:  mRCA 50%, o/w no sig CAD, EF 25%   Cardiomyopathy ([place])    EF 35-40% in 2/16 in setting of AF with RVR >> echo 5/16 with improved LVF with EF 65-70%   Chronic atrial fibrillation (HCC)    Chronic diastolic heart failure (HCC)    HFPF - EF ~65-70% (OFTEN EXACERBATED BY AFIB)   CKD (chronic kidney disease)    hx of worsening renal failure and hyperK+ in setting of acute diast CHF >> required CVVHD in 4/16 and dialysis x 1 in 5/16   CKD stage 3 due to type 2 diabetes mellitus ([place]) 09/17/2015   Colon cancer ([place])    Colon polyps 09/21/2012   Tubular adenoma   Diabetes ([place])    Hyperlipidemia    Hypertension    Obesity hypoventilation syndrome ([place])    Renal insufficiency    Sleep apnea     Medications:  Medications Prior to Admission  Medication Sig Dispense Refill Last Dose   allopurinol (ZYLOPRIM) 300 MG tablet Take 1 tablet (300 mg total) by mouth daily. 90 tablet 3    insulin degludec (TRESIBA) 100 UNIT/ML FlexTouch Pen Inject 24 Units into the skin at bedtime.      KLOR-CON M20 20 MEQ tablet Take 0.5 tablets by mouth daily.      metoprolol succinate (TOPROL-XL) 50 MG 24 hr tablet Take 75 mg by mouth daily. Takes 1.5  tabs TID (78m 3 times daily)      pantoprazole (PROTONIX) 40 MG tablet TAKE 1 TABLET BY MOUTH EVERY DAY 90 tablet 1    sildenafil (REVATIO) 20 MG tablet Take 1 tablet (20 mg total) by mouth 3 (three) times daily. 90 tablet 11    torsemide (DEMADEX) 20 MG tablet Take 3 tablets (60 mg total) by mouth 2 (two) times daily. 540 tablet 3    acetaminophen (TYLENOL) 325 MG tablet Take 2 tablets (650 mg total) by mouth every 6 (six) hours as needed for mild pain (or Fever >/= 101). 12 tablet 0    atorvastatin (LIPITOR) 20 MG tablet TAKE 1 TABLET BY MOUTH EVERY DAY 90 tablet 3    benzonatate (TESSALON) 100 MG capsule Take 1 capsule (100 mg total) by mouth 2 (two) times daily as needed for cough.      Dulaglutide (TRULICITY) 1.5 MLK/9.1PHSOPN Inject 1.5 mg into the skin once a week. 6 mL 3    ertugliflozin L-PyroglutamicAc (STEGLATRO) 5 MG TABS tablet Take 5 mg by mouth daily.      ferrous sulfate 325 (65 FE) MG tablet Take 325 mg by mouth daily with breakfast.       glucose blood (CONTOUR  NEXT TEST) test strip Test BS TID Dx E11.29 300 strip 3    Insulin Glargine (LANTUS SOLOSTAR [place]) Inject into the skin. 16 units      insulin glargine-yfgn (SEMGLEE) 100 UNIT/ML Pen Inject 16 Units into the skin at bedtime.      Insulin Pen Needle 32G X 4 MM MISC Use to give insulin daily Dx E11.29 100 each 3    melatonin 5 MG TABS Take 5 mg by mouth at bedtime as needed.      metoprolol succinate (TOPROL-XL) 25 MG 24 hr tablet Take 1 tablet (25 mg total) by mouth daily.      OXYGEN Inhale into the lungs. Continuous      polyethylene glycol (MIRALAX / GLYCOLAX) 17 g packet Take 17 g by mouth daily. 14 each 0    warfarin (COUMADIN) 7.5 MG tablet Take 7.5 mg by mouth daily.       Assessment: Pharmacy consulted to dose warfarin in patient with atrial fibrillation.  INR on admission is 2.7.  Patient is an unreliable historian.  Home dose listed as 7.5 mg daily per med rec.    Patient recently admitted here in October for  GI bleed and per GI recommended INR 2-2.5.  INR is 2.8 (supratherapeutic) - warfarin held yesterday with INR up - also was held previously on 11/20 and 11/21 with lower warfarin doses when given. Hgb 8.2, plt 98 stable. No s/sx of bleeding.   Goal of Therapy:  INR 2-2.5 Monitor platelets by anticoagulation protocol: Yes   Plan:  Hold warfarin for today Monitor daily INR and s/s of bleeding.  Antonietta Jewel, PharmD, [place] Clinical Pharmacist  Phone: [phone] 11/27/2020 9:45 AM  Please check AMION for all [place] phone numbers After 10:00 PM, call [place] [phone]

## 2020-11-27 NOTE — Progress Notes (Signed)
Dr. Broadus John notified about patient being very lethargic and difficult to arouse. New orders received.

## 2020-11-27 NOTE — Progress Notes (Addendum)
PROGRESS NOTE    William Flores  ANV:[id number] DOB: [birth year] DOA: 11/22/2020 PCP: [name], [name], MD    Brief Narrative:  70/M with a history of atrial fibrillation on anticoagulation, chronic kidney disease stage IIIb, chronic right-sided heart failure, pulmonary hypertension, cirrhosis, who is currently at [facility] skilled nursing facility.  He was admitted to the hospital with increasing lower extremity edema and scrotal swelling.   -Weight was up 30 pounds from August, chest x-ray noted vascular congestion, ultrasound noted moderate ascites, echo with EF of 55-60%, severe RV dysfunction and dilated IVC -Admitted, seen by cardiology in consultation, started on diuretics -Transferred to [facility] for heart failure team eval, 11/22 had paracentesis, 2.4 L removed -11/23 started on milrinone and Lasix drip   Assessment & Plan:   Acute on chronic right-sided heart failure, cor pulmonale Severe pulmonary hypertension -Worsening fluid overload, history of 30 pound weight gain since August despite recent increase in diuretics -Heart failure team following, remains on milrinone for severe RV dysfunction, cardiac renal syndrome and Lasix drip --5.1 L and 2.4L removed via paracentesis -Urine output poor in the last 24 hours, creatinine worsening as well -Remains on sildenafil for severe PAH, continue home O2 and CPAP -More somnolent this a.m., checking ABG, ammonia, cannot rule out a component of uremia as well, slight worsening creatinine noted, nephrology consulted today  Metabolic encephalopathy -Component of uremia may be contributing -Check stat ABG and repeat ammonia level, remains on lactulose  Acute on chronic anemia -patient denies any overt bleeding, anemia panel with severe iron deficiency -Will give IV iron today, no overt bleeding noted, baseline hemoglobin in the 8-9.5 range -He is anticoagulated with Coumadin for A. fib, may not be able to tolerate this long-term given  liver disease and chronic thrombocytopenia   Acute on chronic respiratory failure with hypoxia -Chronically on 2 L of oxygen, currently requiring 4L -As above   Cirrhosis secondary to NASH, possibly right heart failure -Abdominal ultrasound showed mild to mod ascites -since creatinine trending up with diuretics, underwent paracentesis 11/22, 2.4 L removed -LFTs are stable, albumin is low   Chronic atrial fibrillation -Heart rate is currently stable -Continue Toprol -He is anticoagulated with Coumadin, if platelet count continues to trend down may need to discontinue Coumadin in the future    AKI on Chronic kidney disease stage IIIb -Baseline creatinine appears to be around 2.0 -Creatinine worsening, hemodynamically mediated -Nephrology consulted, may need CRRT   Thrombocytopenia -Related to liver disease -Continue to follow   Diabetes mellitus -Continued on basal insulin -Follow blood sugars with sliding scale -Blood sugars currently stable   Obstructive sleep apnea -Continue on CPAP   Morbid obesity -BMI 49.2 -Increased risk of mobility/mortality   GERD -Continue Protonix  DVT prophylaxis: Coumadin  Code Status: Full code Family Communication: no family at bedside, will update daughter Disposition Plan: Status is: Inpatient  Remains inpatient appropriate because: continued IV diuresis, severity of illness  Consultants:  Cardiology  Procedures:  Paracentesis 11/22-2.4 L drained Echo: LVEF 59-97%, RV systolic function severely reduced  Antimicrobials:      Subjective: -Feels okay, breathing is improving  Objective: Vitals:   11/26/20 2000 11/27/20 0000 11/27/20 0524 11/27/20 0600  BP:      Pulse:      Resp:  20    Temp: 98.4 F (36.9 C) 98.3 F (36.8 C)  98.4 F (36.9 C)  TempSrc: Oral Oral  Oral  SpO2:  96%  99%  Weight:   129.1 kg  Height:        Intake/Output Summary (Last 24 hours) at 11/27/2020 1041 Last data filed at 11/27/2020  8115 Gross per 24 hour  Intake 834.09 ml  Output 950 ml  Net -115.91 ml   Filed Weights   11/25/20 0000 11/26/20 0317 11/27/20 0524  Weight: 127.1 kg 127 kg 129.1 kg    Examination:  General exam: Obese chronically ill male laying in bed, lethargic today, withdraws to painful stimuli, mumbles few words CVS: S1-S2, regular rate and rhythm Lungs: Decreased breath sounds at the bases Abdomen: Soft, nontender, bowel sounds present, positive fluid thrill Extremities: Trace edema, ACE wraps on Skin: No rash on exposed skin Psych: Flat affect  Data Reviewed: I have personally reviewed following labs and imaging studies  CBC: Recent Labs  Lab 11/22/20 0424 11/23/20 0422 11/24/20 0620 11/25/20 0255 11/26/20 0450 11/27/20 0500  WBC 4.8 5.6 4.7 4.2 4.8 5.0  NEUTROABS 3.6  --   --   --   --   --   HGB 8.9* 9.0* 9.5* 8.6* 7.7* 8.2*  HCT 32.8* 33.6* 35.0* 31.5* 28.6* 30.6*  MCV 94.3 94.1 95.1 93.2 92.6 93.0  PLT 102* 103* 94* 91* 94* 98*   Basic Metabolic Panel: Recent Labs  Lab 11/22/20 0424 11/23/20 0422 11/24/20 0620 11/25/20 0255 11/26/20 0450 11/27/20 0500  NA 140 138 138 136 135 135  K 4.2 4.3 4.4 4.5 3.8 4.4  CL 101 100 99 97* 97* 95*  CO2 30 31 31 30 31  33*  GLUCOSE 134* 137* 132* 171* 210* 172*  BUN 58* 64* 68* 71* 68* 72*  CREATININE 2.08* 2.28* 2.43* 2.48* 2.46* 2.82*  CALCIUM 8.6* 8.5* 8.7* 8.6* 8.1* 8.3*  MG 2.5*  --   --   --  2.4  --   PHOS 4.6  --   --   --   --   --    GFR: Estimated Creatinine Clearance: 32.4 mL/min (A) (by C-G formula based on SCr of 2.82 mg/dL (H)). Liver Function Tests: Recent Labs  Lab 11/22/20 0424 11/23/20 0422  AST 19 16  ALT 16 14  ALKPHOS 79 70  BILITOT 0.9 1.0  PROT 6.8 6.4*  ALBUMIN 3.4* 3.2*   No results for input(s): LIPASE, AMYLASE in the last 168 hours. Recent Labs  Lab 11/22/20 0425 11/25/20 0945  AMMONIA 35 48*   Coagulation Profile: Recent Labs  Lab 11/23/20 0422 11/24/20 0620 11/25/20 0255  11/26/20 0450 11/27/20 0500  INR 3.0* 2.4* 2.5* 3.1* 2.8*   Cardiac Enzymes: No results for input(s): CKTOTAL, CKMB, CKMBINDEX, TROPONINI in the last 168 hours. BNP (last 3 results) No results for input(s): PROBNP in the last 8760 hours. HbA1C: No results for input(s): HGBA1C in the last 72 hours. CBG: Recent Labs  Lab 11/26/20 0618 11/26/20 1142 11/26/20 1602 11/26/20 2129 11/27/20 0657  GLUCAP 169* 158* 150* 169* 180*   Lipid Profile: No results for input(s): CHOL, HDL, LDLCALC, TRIG, CHOLHDL, LDLDIRECT in the last 72 hours. Thyroid Function Tests: No results for input(s): TSH, T4TOTAL, FREET4, T3FREE, THYROIDAB in the last 72 hours. Anemia Panel: Recent Labs    11/26/20 1410  VITAMINB12 509  FOLATE 8.1  FERRITIN 31  TIBC 389  IRON 17*  RETICCTPCT 2.0   Sepsis Labs: No results for input(s): PROCALCITON, LATICACIDVEN in the last 168 hours.  Recent Results (from the past 240 hour(s))  Resp Panel by RT-PCR (Flu A&B, Covid) Nasopharyngeal Swab     Status: None   Collection  Time: 11/22/20  5:12 AM   Specimen: Nasopharyngeal Swab; Nasopharyngeal(NP) swabs in vial transport medium  Result Value Ref Range Status   SARS Coronavirus 2 by RT PCR NEGATIVE NEGATIVE Final    Comment: (NOTE) SARS-CoV-2 target nucleic acids are NOT DETECTED.  The SARS-CoV-2 RNA is generally detectable in upper respiratory specimens during the acute phase of infection. The lowest concentration of SARS-CoV-2 viral copies this assay can detect is 138 copies/mL. A negative result does not preclude SARS-Cov-2 infection and should not be used as the sole basis for treatment or other patient management decisions. A negative result may occur with  improper specimen collection/handling, submission of specimen other than nasopharyngeal swab, presence of viral mutation(s) within the areas targeted by this assay, and inadequate number of viral copies(<138 copies/mL). A negative result must be combined  with clinical observations, patient history, and epidemiological information. The expected result is Negative.  Fact Sheet for Patients:  EntrepreneurPulse.com.au  Fact Sheet for Healthcare Providers:  IncredibleEmployment.be  This test is no t yet approved or cleared by the [place] FDA and  has been authorized for detection and/or diagnosis of SARS-CoV-2 by FDA under an Emergency Use Authorization (EUA). This EUA will remain  in effect (meaning this test can be used) for the duration of the COVID-19 declaration under Section 564(b)(1) of the Act, 21 U.S.C.section 360bbb-3(b)(1), unless the authorization is terminated  or revoked sooner.       Influenza A by PCR NEGATIVE NEGATIVE Final   Influenza B by PCR NEGATIVE NEGATIVE Final    Comment: (NOTE) The Xpert Xpress SARS-CoV-2/FLU/RSV plus assay is intended as an aid in the diagnosis of influenza from Nasopharyngeal swab specimens and should not be used as a sole basis for treatment. Nasal washings and aspirates are unacceptable for Xpert Xpress SARS-CoV-2/FLU/RSV testing.  Fact Sheet for Patients: EntrepreneurPulse.com.au  Fact Sheet for Healthcare Providers: IncredibleEmployment.be  This test is not yet approved or cleared by the [place] FDA and has been authorized for detection and/or diagnosis of SARS-CoV-2 by FDA under an Emergency Use Authorization (EUA). This EUA will remain in effect (meaning this test can be used) for the duration of the COVID-19 declaration under Section 564(b)(1) of the Act, 21 U.S.C. section 360bbb-3(b)(1), unless the authorization is terminated or revoked.  Performed at [name], [address], [place], [place] [address]   Culture, body fluid w Gram Stain-bottle     Status: None (Preliminary result)   Collection Time: 11/24/20  2:05 PM   Specimen: Peritoneal Washings  Result Value Ref Range Status    Specimen Description PERITONEAL  Final   Special Requests BOTTLES DRAWN AEROBIC AND ANAEROBIC 10 CC  Final   Culture   Final    NO GROWTH 3 DAYS Performed at [facility], [address] [place], [place] [address]    Report Status PENDING  Incomplete  Gram stain     Status: None   Collection Time: 11/24/20  2:05 PM   Specimen: Peritoneal Washings  Result Value Ref Range Status   Specimen Description PERITONEAL  Final   Special Requests NONE PERITONEAL  Final   Gram Stain   Final    WBC PRESENT,BOTH PMN AND MONONUCLEAR NO ORGANISMS SEEN CYTOSPIN SMEAR Performed at [facility], [address], [place], [place] [address]    Report Status 11/24/2020 FINAL  Final    Radiology Studies: DG CHEST PORT 1 VIEW  Result Date: 11/27/2020 CLINICAL DATA:  Dyspnea. Cough and congestion and shortness of breath.  EXAM: PORTABLE CHEST 1 VIEW COMPARISON:  11/25/2020 and older studies. FINDINGS: Cardiac silhouette is borderline enlarged. No mediastinal or hilar masses. There is hazy opacity throughout right hemithorax most prominent at the base, consistent with a combination of layering pleural fluid and airspace lung opacity. This has increased from the prior exam. There is also opacity at the left lung base, not well defined, consistent with atelectasis with a probable small effusion. Mid to upper left lung is clear. No pneumothorax. Right-sided PICC is stable. IMPRESSION: 1. Interval worsening of lung aeration on the right, although the apparent change may be due to differences in patient positioning and technique. Right-sided opacity is consistent with a combination of layering right pleural fluid and parenchymal lung opacity, the latter finding which may reflect asymmetric edema or pneumonia. 2. Mild stable opacity at the left lung base consistent with atelectasis with a suspected small effusion. Electronically Signed   By: Lajean Manes M.D.   On: 11/27/2020 10:24   DG CHEST PORT 1 VIEW  Result Date:  11/25/2020 CLINICAL DATA:  PICC line placement EXAM: PORTABLE CHEST 1 VIEW COMPARISON:  11/22/2020 FINDINGS: Right upper extremity central venous catheter tip projects over SVC origin. Cardiomegaly with vascular congestion, pulmonary edema and pleural effusions without significant change. Aortic atherosclerosis. IMPRESSION: 1. Right upper extremity central venous catheter tip overlies the SVC origin 2. Cardiomegaly with continued vascular congestion, edema and pleural effusions. Electronically Signed   By: Donavan Foil M.D.   On: 11/25/2020 22:30        Scheduled Meds:  allopurinol  300 mg Oral Daily   atorvastatin  20 mg Oral Daily   Chlorhexidine Gluconate Cloth  6 each Topical Daily   insulin aspart  0-5 Units Subcutaneous QHS   insulin aspart  0-9 Units Subcutaneous TID WC   insulin glargine-yfgn  16 Units Subcutaneous QHS   lactulose  20 g Oral BID   mouth rinse  15 mL Mouth Rinse BID   metoprolol succinate  25 mg Oral Daily   pantoprazole  40 mg Oral Daily   polyethylene glycol  17 g Oral Daily   sildenafil  20 mg Oral TID   sodium chloride flush  10-40 mL Intracatheter Q12H   tamsulosin  0.4 mg Oral Daily   Warfarin - Pharmacist Dosing Inpatient   Does not apply q1600   Continuous Infusions:  furosemide (LASIX) 200 mg in dextrose 5% 100 mL (89m/mL) infusion 20 mg/hr (11/26/20 2348)   milrinone 0.125 mcg/kg/min (11/26/20 0328)     LOS: 5 days    Time spent: 239ms    PrDomenic PoliteMD [facility] 11/27/2020, 10:41 AM

## 2020-11-28 DIAGNOSIS — K7031 Alcoholic cirrhosis of liver with ascites: Secondary | ICD-10-CM | POA: Diagnosis not present

## 2020-11-28 DIAGNOSIS — I5033 Acute on chronic diastolic (congestive) heart failure: Secondary | ICD-10-CM | POA: Diagnosis not present

## 2020-11-28 DIAGNOSIS — I50813 Acute on chronic right heart failure: Secondary | ICD-10-CM

## 2020-11-28 DIAGNOSIS — I509 Heart failure, unspecified: Secondary | ICD-10-CM

## 2020-11-28 DIAGNOSIS — R188 Other ascites: Secondary | ICD-10-CM

## 2020-11-28 DIAGNOSIS — Z7189 Other specified counseling: Secondary | ICD-10-CM

## 2020-11-28 DIAGNOSIS — I5031 Acute diastolic (congestive) heart failure: Secondary | ICD-10-CM

## 2020-11-28 DIAGNOSIS — E1122 Type 2 diabetes mellitus with diabetic chronic kidney disease: Secondary | ICD-10-CM

## 2020-11-28 DIAGNOSIS — I272 Pulmonary hypertension, unspecified: Secondary | ICD-10-CM | POA: Diagnosis not present

## 2020-11-28 DIAGNOSIS — I50810 Right heart failure, unspecified: Secondary | ICD-10-CM | POA: Diagnosis not present

## 2020-11-28 DIAGNOSIS — N179 Acute kidney failure, unspecified: Secondary | ICD-10-CM | POA: Diagnosis not present

## 2020-11-28 LAB — RENAL FUNCTION PANEL
Albumin: 2.6 g/dL — ABNORMAL LOW (ref 3.5–5.0)
Albumin: 2.7 g/dL — ABNORMAL LOW (ref 3.5–5.0)
Anion gap: 5 (ref 5–15)
Anion gap: 4 — ABNORMAL LOW (ref 5–15)
BUN: 45 mg/dL — ABNORMAL HIGH (ref 8–23)
BUN: 32 mg/dL — ABNORMAL HIGH (ref 8–23)
CO2: 29 mmol/L (ref 22–32)
CO2: 29 mmol/L (ref 22–32)
Calcium: 7.8 mg/dL — ABNORMAL LOW (ref 8.9–10.3)
Calcium: 8 mg/dL — ABNORMAL LOW (ref 8.9–10.3)
Chloride: 97 mmol/L — ABNORMAL LOW (ref 98–111)
Chloride: 101 mmol/L (ref 98–111)
Creatinine, Ser: 2 mg/dL — ABNORMAL HIGH (ref 0.61–1.24)
Creatinine, Ser: 1.71 mg/dL — ABNORMAL HIGH (ref 0.61–1.24)
GFR, Estimated: 36 mL/min — ABNORMAL LOW (ref >60)
GFR, Estimated: 43 mL/min — ABNORMAL LOW (ref >60)
Glucose, Bld: 249 mg/dL — ABNORMAL HIGH (ref 70–99)
Glucose, Bld: 195 mg/dL — ABNORMAL HIGH (ref 70–99)
Phosphorus: 3.4 mg/dL (ref 2.5–4.6)
Phosphorus: 2.8 mg/dL (ref 2.5–4.6)
Potassium: 4.2 mmol/L (ref 3.5–5.1)
Potassium: 4.3 mmol/L (ref 3.5–5.1)
Sodium: 131 mmol/L — ABNORMAL LOW (ref 135–145)
Sodium: 134 mmol/L — ABNORMAL LOW (ref 135–145)

## 2020-11-28 LAB — COOXEMETRY PANEL
Carboxyhemoglobin: 1.7 % — ABNORMAL HIGH (ref 0.5–1.5)
Carboxyhemoglobin: 1.8 % — ABNORMAL HIGH (ref 0.5–1.5)
Methemoglobin: 0.7 % (ref 0.0–1.5)
Methemoglobin: 0.7 % (ref 0.0–1.5)
O2 Saturation: 86.9 %
O2 Saturation: 82.8 %
Total hemoglobin: 8.1 g/dL — ABNORMAL LOW (ref 12.0–16.0)
Total hemoglobin: 7.3 g/dL — ABNORMAL LOW (ref 12.0–16.0)

## 2020-11-28 LAB — PROTIME-INR
INR: 2.9 — ABNORMAL HIGH (ref 0.8–1.2)
Prothrombin Time: 29.9 seconds — ABNORMAL HIGH (ref 11.4–15.2)

## 2020-11-28 LAB — GLUCOSE, CAPILLARY
Glucose-Capillary: 129 mg/dL — ABNORMAL HIGH (ref 70–99)
Glucose-Capillary: 105 mg/dL — ABNORMAL HIGH (ref 70–99)
Glucose-Capillary: 157 mg/dL — ABNORMAL HIGH (ref 70–99)
Glucose-Capillary: 175 mg/dL — ABNORMAL HIGH (ref 70–99)

## 2020-11-28 LAB — CBC
HCT: 29.4 % — ABNORMAL LOW (ref 39.0–52.0)
Hemoglobin: 7.8 g/dL — ABNORMAL LOW (ref 13.0–17.0)
MCH: 24.5 pg — ABNORMAL LOW (ref 26.0–34.0)
MCHC: 26.5 g/dL — ABNORMAL LOW (ref 30.0–36.0)
MCV: 92.2 fL (ref 80.0–100.0)
Platelets: 98 10*3/uL — ABNORMAL LOW (ref 150–400)
RBC: 3.19 MIL/uL — ABNORMAL LOW (ref 4.22–5.81)
RDW: 19 % — ABNORMAL HIGH (ref 11.5–15.5)
WBC: 4.9 10*3/uL (ref 4.0–10.5)
nRBC: 0.6 % — ABNORMAL HIGH (ref 0.0–0.2)

## 2020-11-28 LAB — MAGNESIUM: Magnesium: 2.4 mg/dL (ref 1.7–2.4)

## 2020-11-28 MED ORDER — ZINC OXIDE 12.8 % EX OINT
TOPICAL_OINTMENT | Freq: Two times a day (BID) | CUTANEOUS | Status: DC
  Administered 2020-12-01 – 2020-12-05 (×5): 1 via TOPICAL
  Filled 2020-11-28 (×2): qty 56.7

## 2020-11-28 MED ORDER — MIDODRINE HCL 5 MG PO TABS
5.0000 mg | ORAL_TABLET | Freq: Three times a day (TID) | ORAL | Status: DC
  Administered 2020-11-28 – 2020-11-30 (×8): 5 mg via ORAL
  Filled 2020-11-28 (×9): qty 1

## 2020-11-28 NOTE — Progress Notes (Signed)
ANTICOAGULATION CONSULT NOTE  Pharmacy Consult for warfarin Indication: atrial fibrillation  Allergies  Allergen Reactions   Codeine     Headache     Patient Measurements: Height: 5' 7"  (621.3 cm) Weight: 129.3 kg (285 lb 0.9 oz) IBW/kg (Calculated) : 66.1  Vital Signs: Temp: 97.8 F (36.6 C) (11/26 0800) Temp Source: Axillary (11/26 0800) BP: 95/66 (11/26 1000) Pulse Rate: 102 (11/26 1000)  Labs: Recent Labs    11/26/20 0450 11/27/20 0500 11/27/20 1638 11/27/20 1941 11/28/20 0316  HGB 7.7* 8.2*  --  10.5* 7.8*  HCT 28.6* 30.6*  --  31.0* 29.4*  PLT 94* 98*  --   --  98*  LABPROT 31.7* 29.3*  --   --  29.9*  INR 3.1* 2.8*  --   --  2.9*  CREATININE 2.46* 2.82* 2.43*  --  2.00*     Estimated Creatinine Clearance: 45.7 mL/min (A) (by C-G formula based on SCr of 2 mg/dL (H)).   Medical History: Past Medical History:  Diagnosis Date   CAD (coronary artery disease)    LHC 2/08:  mRCA 50%, o/w no sig CAD, EF 25%   Cardiomyopathy ([place])    EF 35-40% in 2/16 in setting of AF with RVR >> echo 5/16 with improved LVF with EF 65-70%   Chronic atrial fibrillation ([facility])    Chronic diastolic heart failure ([facility])    HFPF - EF ~65-70% (OFTEN EXACERBATED BY AFIB)   CKD (chronic kidney disease)    hx of worsening renal failure and hyperK+ in setting of acute diast CHF >> required CVVHD in 4/16 and dialysis x 1 in 5/16   CKD stage 3 due to type 2 diabetes mellitus ([place]) 09/17/2015   Colon cancer ([place])    Colon polyps 09/21/2012   Tubular adenoma   Diabetes ([place])    Hyperlipidemia    Hypertension    Obesity hypoventilation syndrome ([place])    Renal insufficiency    Sleep apnea     Medications:  Medications Prior to Admission  Medication Sig Dispense Refill Last Dose   allopurinol (ZYLOPRIM) 300 MG tablet Take 1 tablet (300 mg total) by mouth daily. 90 tablet 3    insulin degludec (TRESIBA) 100 UNIT/ML FlexTouch Pen Inject 24 Units into the skin at bedtime.       KLOR-CON M20 20 MEQ tablet Take 0.5 tablets by mouth daily.      metoprolol succinate (TOPROL-XL) 50 MG 24 hr tablet Take 75 mg by mouth daily. Takes 1.5 tabs TID (53m 3 times daily)      pantoprazole (PROTONIX) 40 MG tablet TAKE 1 TABLET BY MOUTH EVERY DAY 90 tablet 1    sildenafil (REVATIO) 20 MG tablet Take 1 tablet (20 mg total) by mouth 3 (three) times daily. 90 tablet 11    torsemide (DEMADEX) 20 MG tablet Take 3 tablets (60 mg total) by mouth 2 (two) times daily. 540 tablet 3    acetaminophen (TYLENOL) 325 MG tablet Take 2 tablets (650 mg total) by mouth every 6 (six) hours as needed for mild pain (or Fever >/= 101). 12 tablet 0    atorvastatin (LIPITOR) 20 MG tablet TAKE 1 TABLET BY MOUTH EVERY DAY 90 tablet 3    benzonatate (TESSALON) 100 MG capsule Take 1 capsule (100 mg total) by mouth 2 (two) times daily as needed for cough.      Dulaglutide (TRULICITY) 1.5 MYQ/6.5HQSOPN Inject 1.5 mg into the skin once a week. 6 mL 3  ertugliflozin L-PyroglutamicAc (STEGLATRO) 5 MG TABS tablet Take 5 mg by mouth daily.      ferrous sulfate 325 (65 FE) MG tablet Take 325 mg by mouth daily with breakfast.       glucose blood (CONTOUR NEXT TEST) test strip Test BS TID Dx E11.29 300 strip 3    Insulin Glargine (LANTUS SOLOSTAR [place]) Inject into the skin. 16 units      insulin glargine-yfgn (SEMGLEE) 100 UNIT/ML Pen Inject 16 Units into the skin at bedtime.      Insulin Pen Needle 32G X 4 MM MISC Use to give insulin daily Dx E11.29 100 each 3    melatonin 5 MG TABS Take 5 mg by mouth at bedtime as needed.      metoprolol succinate (TOPROL-XL) 25 MG 24 hr tablet Take 1 tablet (25 mg total) by mouth daily.      OXYGEN Inhale into the lungs. Continuous      polyethylene glycol (MIRALAX / GLYCOLAX) 17 g packet Take 17 g by mouth daily. 14 each 0    warfarin (COUMADIN) 7.5 MG tablet Take 7.5 mg by mouth daily.       Assessment: Pharmacy consulted to dose warfarin in patient with atrial fibrillation.  INR  on admission is 2.7.  Patient is an unreliable historian.  Home dose listed as 7.5 mg daily per med rec.    Patient recently admitted here in October for GI bleed and per GI recommended INR 2-2.5.  INR is 2.9 (supratherapeutic) - warfarin held 11/24 and 11/25, with INR up - also was held previously on 11/20 and 11/21 with lower warfarin doses when given. Hgb 7.8 today, plts low at 98, however stable. No s/sx of bleeding.   Goal of Therapy:  INR 2-2.5 Monitor platelets by anticoagulation protocol: Yes   Plan:  Hold warfarin for today Monitor daily INR and s/s of bleeding.  Cathrine Muster, PharmD PGY2 Cardiology Pharmacy Resident Phone: [phone] 11/28/2020  10:32 AM  Please check AMION for all [place] phone numbers After 10:00 PM, call [place] [phone]

## 2020-11-28 NOTE — Progress Notes (Signed)
NAME:  William Flores, MRN:  [id number], DOB:  1950-03-10, LOS: 6 ADMISSION DATE:  11/22/2020, CONSULTATION DATE: 11/27/2020 REFERRING MD: [facility], CHIEF COMPLAINT: Volume overload renal failure  History of Present Illness:  William Flores is a 70 year old nursing home resident with a plethora of health issues that acute significant for acute on chronic right heart failure cor pulmonale severe pulmonary hypertension, metabolic encephalopathy, acute on chronic anemia chronic with acute respiratory failure, cirrhosis secondary to Karlene Lineman, chronic atrial fibrillation acute kidney injury, thrombocytopenia diabetes mellitus obstructive sleep apnea who presents with worsening volume overload and pulmonary critical care is asked to transfer to intensive care place a hemodialysis catheter so that William Flores can undergo CRRT.  Pertinent  Medical History   Past Medical History:  Diagnosis Date   CAD (coronary artery disease)    LHC 2/08:  mRCA 50%, o/w no sig CAD, EF 25%   Cardiomyopathy ([place])    EF 35-40% in 2/16 in setting of AF with RVR >> echo 5/16 with improved LVF with EF 65-70%   Chronic atrial fibrillation (HCC)    Chronic diastolic heart failure (HCC)    HFPF - EF ~65-70% (OFTEN EXACERBATED BY AFIB)   CKD (chronic kidney disease)    hx of worsening renal failure and hyperK+ in setting of acute diast CHF >> required CVVHD in 4/16 and dialysis x 1 in 5/16   CKD stage 3 due to type 2 diabetes mellitus ([place]) 09/17/2015   Colon cancer ([place])    Colon polyps 09/21/2012   Tubular adenoma   Diabetes ([place])    Hyperlipidemia    Hypertension    Obesity hypoventilation syndrome ([place])    Renal insufficiency    Sleep apnea     Significant Hospital Events: Including procedures, antibiotic start and stop dates in addition to other pertinent events   11/27/2020 consult to pulmonary critical care 11/27/2020 transfer to intensive care unit place hemodialysis catheter  Interim History / Subjective:   Doing well  after brought to ICU. On BIPAP. Tolerating CVVHD   Objective   Blood pressure (!) 109/52, pulse 100, temperature 97.8 F (36.6 C), temperature source Axillary, resp. rate (!) 21, height 5' 7"  (1.702 m), weight 129.3 kg, SpO2 98 %. CVP:  [3 mmHg-10 mmHg] 7 mmHg  FiO2 (%):  [40 %-60 %] 50 %   Intake/Output Summary (Last 24 hours) at 11/28/2020 0715 Last data filed at 11/28/2020 0700 Gross per 24 hour  Intake 791.89 ml  Output 4797 ml  Net -4005.11 ml   Filed Weights   11/26/20 0317 11/27/20 0524 11/28/20 0324  Weight: 127 kg 129.1 kg 129.3 kg    Examination: General: obese male, resting in bed  HENT: no jvd  Lungs: decreased breath sounds in the bases  Cardiovascular: RRR s1 s 2 Abdomen: obese, distended  Extremities: 3+ edema, anasarca  Neuro: follows commands    Resolved Hospital Problem list     Assessment & Plan:   Acute on chronic heart failure cor pulmonale with severe pulmonary hypertension with worsening volume overload. Plan: Volume removal with CVVHD  Pressor support with NEPI to maintain map >72mHg   OSA Nocturnal BIPAP PRN BIPAP during day   Chronic A. Fib Heparin with bridge back to coumadin   Cirrhosis with elevated ammonia level status post paracentesis with 2.4 L removed CVVHD for volume removal Possibly repeat para in future   Chronic anemia - observe H&H  - no active bleeding   Thrombocytopenia Follow platelets   Diabetes mellitus  Metabolic encephalopathy Related to multiple issues outline above  Continue to observe  Is protecting airway    Best Practice (right click and "Reselect all SmartList Selections" daily)   Diet/type: NPO DVT prophylaxis: other GI prophylaxis: PPI Lines: N/A Foley:  N/A Code Status:  full code Last date of multidisciplinary goals of care discussion. Continue palliative involvement pending trajectory.   Labs   CBC: Recent Labs  Lab 11/22/20 0424 11/23/20 0422 11/24/20 0620 11/25/20 0255  11/26/20 0450 11/27/20 0500 11/27/20 1941 11/28/20 0316  WBC 4.8   < > 4.7 4.2 4.8 5.0  --  4.9  NEUTROABS 3.6  --   --   --   --   --   --   --   HGB 8.9*   < > 9.5* 8.6* 7.7* 8.2* 10.5* 7.8*  HCT 32.8*   < > 35.0* 31.5* 28.6* 30.6* 31.0* 29.4*  MCV 94.3   < > 95.1 93.2 92.6 93.0  --  92.2  PLT 102*   < > 94* 91* 94* 98*  --  98*   < > = values in this interval not displayed.    Basic Metabolic Panel: Recent Labs  Lab 11/22/20 0424 11/23/20 0422 11/25/20 0255 11/26/20 0450 11/27/20 0500 11/27/20 1638 11/27/20 1941 11/27/20 1953 11/28/20 0316  NA 140   < > 136 135 135 135 137  --  131*  K 4.2   < > 4.5 3.8 4.4 4.2 4.3  --  4.2  CL 101   < > 97* 97* 95* 97*  --   --  97*  CO2 30   < > 30 31 33* 32  --   --  29  GLUCOSE 134*   < > 171* 210* 172* 127*  --   --  249*  BUN 58*   < > 71* 68* 72* 62*  --   --  45*  CREATININE 2.08*   < > 2.48* 2.46* 2.82* 2.43*  --   --  2.00*  CALCIUM 8.6*   < > 8.6* 8.1* 8.3* 8.0*  --   --  7.8*  MG 2.5*  --   --  2.4  --   --   --  2.6* 2.4  PHOS 4.6  --   --   --   --  4.7*  --   --  3.4   < > = values in this interval not displayed.   GFR: Estimated Creatinine Clearance: 45.7 mL/min (A) (by C-G formula based on SCr of 2 mg/dL (H)). Recent Labs  Lab 11/25/20 0255 11/26/20 0450 11/27/20 0500 11/27/20 1100 11/28/20 0316  WBC 4.2 4.8 5.0  --  4.9  LATICACIDVEN  --   --   --  0.7  --     Liver Function Tests: Recent Labs  Lab 11/22/20 0424 11/23/20 0422 11/27/20 1638 11/28/20 0316  AST 19 16  --   --   ALT 16 14  --   --   ALKPHOS 79 70  --   --   BILITOT 0.9 1.0  --   --   PROT 6.8 6.4*  --   --   ALBUMIN 3.4* 3.2* 2.8* 2.6*   No results for input(s): LIPASE, AMYLASE in the last 168 hours. Recent Labs  Lab 11/22/20 0425 11/25/20 0945 11/27/20 1145  AMMONIA 35 48* 49*    ABG    Component Value Date/Time   PHART 7.326 (L) 11/27/2020 1941   PCO2ART 59.6 (H) 11/27/2020  [place] (L) 11/27/2020 1941   HCO3  31.5 (H) 11/27/2020 1941   TCO2 33 (H) 11/27/2020 1941   ACIDBASEDEF 1.0 05/16/2014 0929   O2SAT 82.8 11/28/2020 0354     Coagulation Profile: Recent Labs  Lab 11/24/20 0620 11/25/20 0255 11/26/20 0450 11/27/20 0500 11/28/20 0316  INR 2.4* 2.5* 3.1* 2.8* 2.9*    Cardiac Enzymes: No results for input(s): CKTOTAL, CKMB, CKMBINDEX, TROPONINI in the last 168 hours.  HbA1C: HB A1C (BAYER DCA - WAIVED)  Date/Time Value Ref Range Status  03/19/2020 01:11 PM 9.6 (H) <7.0 % Final    Comment:                                          Diabetic Adult            <7.0                                       Healthy Adult        4.3 - 5.7                                                           (DCCT/NGSP) American Diabetes Association's Summary of Glycemic Recommendations for Adults with Diabetes: Hemoglobin A1c <7.0%. More stringent glycemic goals (A1c <6.0%) may further reduce complications at the cost of increased risk of hypoglycemia.   12/20/2019 04:15 PM 9.9 (H) <7.0 % Final    Comment:                                          Diabetic Adult            <7.0                                       Healthy Adult        4.3 - 5.7                                                           (DCCT/NGSP) American Diabetes Association's Summary of Glycemic Recommendations for Adults with Diabetes: Hemoglobin A1c <7.0%. More stringent glycemic goals (A1c <6.0%) may further reduce complications at the cost of increased risk of hypoglycemia.    Hgb A1c MFr Bld  Date/Time Value Ref Range Status  10/14/2020 02:39 PM 7.1 (H) 4.8 - 5.6 % Final    Comment:    (NOTE) Pre diabetes:          5.7%-6.4%  Diabetes:              >6.4%  Glycemic control for   <7.0% adults with diabetes   06/22/2020 09:34 AM 8.2 (H) 4.8 - 5.6 % Final    Comment:    (  NOTE)         Prediabetes: 5.7 - 6.4         Diabetes: >6.4         Glycemic control for adults with diabetes: <7.0     CBG: Recent Labs  Lab  11/27/20 1159 11/27/20 1637 11/27/20 2125 11/27/20 2145 11/28/20 0636  GLUCAP 158* 110* 138* 144* 129*    Review of Systems:   Na due to encephalopathic state   Past Medical History:  William Flores,  has a past medical history of CAD (coronary artery disease), Cardiomyopathy ([place]), Chronic atrial fibrillation (HCC), Chronic diastolic heart failure ([place]), CKD (chronic kidney disease), CKD stage 3 due to type 2 diabetes mellitus ([place]) (09/17/2015), Colon cancer ([place]), Colon polyps (09/21/2012), Diabetes ([place]), Hyperlipidemia, Hypertension, Obesity hypoventilation syndrome ([place]), Renal insufficiency, and Sleep apnea.   Surgical History:   Past Surgical History:  Procedure Laterality Date   CIRCUMCISION     COLON RESECTION     COLONOSCOPY N/A 09/21/2012   Procedure: COLONOSCOPY;  Surgeon: Inda Castle, MD;  Location: [facility] END[facility]COPY;  Service: Endoscopy;  Laterality: N/A;   COLONOSCOPY N/A 12/28/2018   Procedure: COLONOSCOPY;  Surgeon: Rogene Houston, MD;  Location[facility];  Service: Endoscopy;  Laterality: N/A;   COLONOSCOPY WITH PROPOFOL N/A 10/17/2020   Procedure: COLONOSCOPY WITH PROPOFOL;  Surgeon: Harvel Quale, MD;  Location: [facility];  Service: Gastroenterology;  Laterality: N/A;   ENTEROSCOPY N/A 06/24/2020   Procedure: ENTEROSCOPY;  Surgeon: Gatha Mayer, MD;  Location: [facility] ENDOSCOPY;  Service: Endoscopy;  Laterality: N/A;   ESOPHAGOGASTRODUODENOSCOPY N/A 12/28/2018   Procedure: ESOPHAGOGASTRODUODENOSCOPY (EGD);  Surgeon: Rogene Houston, MD;  [facility];  Service: Endoscopy;  Laterality: N/A;   POLYPECTOMY  10/17/2020   Procedure: POLYPECTOMY;  Surgeon: Harvel Quale, MD;  [facility];  Service: Gastroenterology;;  rectal, transverse, descending, ascending, sigmoid   RIGHT HEART CATH N/A 04/11/2019   Procedure: RIGHT HEART CATH;  Surgeon: Larey Dresser, MD;  Location: [facility] CV LAB;  Service: Cardiovascular;   Laterality: N/A;   US ECHOCARDIOGRAPHY  03/04/2010   abnormal study     Social History:   reports that William Flores quit smoking about 37 years ago. William Flores smoking use included cigarettes. William Flores has never used smokeless tobacco. William Flores reports that William Flores does not drink alcohol and does not use drugs.   Family History:  William Flores family history includes Breast cancer in William Flores mother; Diabetes in William Flores mother; Heart attack in William Flores father.   Allergies Allergies  Allergen Reactions   Codeine     Headache      Home Medications  Prior to Admission medications   Medication Sig Start Date End Date Taking? Authorizing Provider  allopurinol (ZYLOPRIM) 300 MG tablet Take 1 tablet (300 mg total) by mouth daily. 12/20/19  Yes [name], [name], MD  insulin degludec (TRESIBA) 100 UNIT/ML FlexTouch Pen Inject 24 Units into the skin at bedtime.   Yes [provider]  KLOR-CON M20 20 MEQ tablet Take 0.5 tablets by mouth daily. 10/31/20  Yes [provider]  metoprolol succinate (TOPROL-XL) 50 MG 24 hr tablet Take 75 mg by mouth daily. Takes 1.5 tabs TID (22m 3 times daily)   Yes [provider]  pantoprazole (PROTONIX) 40 MG tablet TAKE 1 TABLET BY MOUTH EVERY DAY 06/02/20  Yes [name], JFransisca Kaufmann MD  sildenafil (REVATIO) 20 MG tablet Take 1 tablet (20 mg total) by mouth 3 (three) times daily.  08/24/20  Yes Larey Dresser, MD  torsemide (DEMADEX) 20 MG tablet Take 3 tablets (60 mg total) by mouth 2 (two) times daily. 09/29/20  Yes Larey Dresser, MD  acetaminophen (TYLENOL) 325 MG tablet Take 2 tablets (650 mg total) by mouth every 6 (six) hours as needed for mild pain (or Fever >/= 101). 01/01/19   Roxan Hockey, MD  atorvastatin (LIPITOR) 20 MG tablet TAKE 1 TABLET BY MOUTH EVERY DAY 03/19/20   [name], [name], MD  benzonatate (TESSALON) 100 MG capsule Take 1 capsule (100 mg total) by mouth 2 (two) times daily as needed for cough. 06/25/20   Patrecia Pour, MD  Dulaglutide (TRULICITY) 1.5 QB/3.4LP  SOPN Inject 1.5 mg into the skin once a week. 12/20/19   [name], [name], MD  ertugliflozin L-PyroglutamicAc (STEGLATRO) 5 MG TABS tablet Take 5 mg by mouth daily.    [provider]  ferrous sulfate 325 (65 FE) MG tablet Take 325 mg by mouth daily with breakfast.     [provider]  glucose blood (CONTOUR NEXT TEST) test strip Test BS TID Dx E11.29 05/22/20   [name], [name], MD  Insulin Glargine (LANTUS SOLOSTAR [place]) Inject into the skin. 16 units    [provider]  insulin glargine-yfgn (SEMGLEE) 100 UNIT/ML Pen Inject 16 Units into the skin at bedtime.    [provider]  Insulin Pen Needle 32G X 4 MM MISC Use to give insulin daily Dx E11.29 04/15/19   Charlynne Cousins, MD  melatonin 5 MG TABS Take 5 mg by mouth at bedtime as needed.    [provider]  metoprolol succinate (TOPROL-XL) 25 MG 24 hr tablet Take 1 tablet (25 mg total) by mouth daily. 10/25/20   Orson Eva, MD  OXYGEN Inhale into the lungs. Continuous    [provider]  polyethylene glycol (MIRALAX / GLYCOLAX) 17 g packet Take 17 g by mouth daily. 01/01/19   Roxan Hockey, MD  warfarin (COUMADIN) 7.5 MG tablet Take 7.5 mg by mouth daily.    [provider]  diltiazem [facility]) 240 MG 24 hr capsule TAKE 1 CAPSULE DAILY 10/19/12 02/17/13  Vernie Shanks, MD     This patient is critically ill with multiple organ system failure; which, requires frequent high complexity decision making, assessment, support, evaluation, and titration of therapies. This was completed through the application of advanced monitoring technologies and extensive interpretation of multiple databases. During this encounter critical care time was devoted to patient care services described in this note for 32 minutes.  Garner Nash, DO [facility] 11/28/2020 7:16 AM

## 2020-11-28 NOTE — Consult Note (Signed)
[facility] Nurse Consult Note: Patient receiving care in [place]. Reason for Consult: MASD to penis Wound type: MASD-IAD to penis Pressure Injury POA: Yes/No/NA Measurement: Wound bed: Drainage (amount, consistency, odor)  Periwound: Dressing procedure/placement/frequency: I have ordered twice daily application of Triple Paste AFTER washing with soap and water, patting dry.  Monitor the wound area(s) for worsening of condition such as: Signs/symptoms of infection,  Increase in size,  Development of or worsening of odor, Development of pain, or increased pain at the affected locations.  Notify the medical team if any of these develop.  Thank you for the consult.  [place] nurse will not follow at this time.  Please re-consult the [facility] team if needed.  Val Riles, RN, MSN, CWOCN, CNS-BC, pager [phone]

## 2020-11-28 NOTE — Consult Note (Signed)
Palliative Medicine Inpatient Consult Note  Reason for consult:  GOC   HPI: Per intake H&P --> "William Flores is a 70 y.o. male with medical history significant for chronic atrial fibrillation on warfarin, CKD stage IIIb, type II DM, hypertension, hyperlipidemia, GERD, chronic HFpEF (LVEF on 10/16/2020 was 55-60%) and hepatic cirrhosis who presents to the emergency department for evaluation of increased leg swelling and abdominal swelling that has been ongoing for several weeks.  Patient states that he followed up with his cardiologist (Dr. Aundra Dubin) about 2 days ago due to the increased swelling and his diuretic was increased.  He complained of significant testicular swelling since his visit to the cardiologist about 2 days ago, but he denies abdominal or testicular pain, fever, nausea, diarrhea, worsening shortness of breath. Patient was recently admitted from 10/12-10/22 due to GI bleed/hematochezia and acute blood loss anemia."  He came to hospital from [facility].  He is being treated for cor pulmonale/RV failure on [place], (transferred to ICU and started on CRRT yesterday), pulmonary hypertension, AKI on Stage IIIb CKD, acute respiratory failure, a fib, OSA, diabetes, cirrhosis secondary to NASH, anemia, thrombocytopenia.   Clinical Assessment/Goals of Care: I have reviewed medical records including EPIC notes, labs and imaging, received report from bedside Misha, assessed the patient.    I met in person with William Flores in the ICU to further discuss diagnosis prognosis, [place], EOL wishes, disposition and options.   I introduced Palliative Medicine as specialized medical care for people living with serious illness. It focuses on providing relief from the symptoms and stress of a serious illness. The goal is to improve quality of life for both the patient and the family.  A detailed discussion was had today regarding advanced directives.  Concepts specific to code status, artifical feeding  and hydration, continued IV antibiotics and rehospitalization was had.  The difference between a aggressive medical intervention path  and a palliative comfort care path for this patient at this time was had. Values and goals of care important to patient and family were attempted to be elicited.  Mr. Venne has had CPR with [facility] twice in the past. He wishes to have full scope of treatment at this time. MOST form completed. He denies pain at this time and only complains of purities. Denies other symptoms to manage. His goal is to make it through hospitalization and rehab to be able to return home. He lives in a single family home in [place]. 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 He enjoys being out of doors. He is divorced and has a son, William Flores and a step daughter, William Flores. He has one friend that he states is the only person who has visited him in the hospital, William Flores. He is not sure who he would ask to make decisions for him if he cannot. Most likely his step daughter will be chosen but he will think about it for an advanced directive to be completed.  We discussed the importance of continued conversation with family and  medical providers regarding overall plan of care and treatment options, ensuring decisions are within the context of the patients values and GOCs.  He asked me to call his step daughter William Flores ([phone]) and his son William Flores [phone]) to let them know he is in the hospital and share his diagnosis. I made these phone calls and spoke to [place]. Mr. Feldpausch has not see his son in 2 years or his step-daughter  in 3-4 months. I left a voice mail for [employer].  Decision Maker: Patient, Advanced Directive paperwork left at bedside and reviewed. He will think about who may make wishes for him. Will have Chaplain check in Monday for identification of MPOA and signature on paperwork.   SUMMARY OF RECOMMENDATIONS    Code Status/Advance  Care Planning: FULL CODE- MOST completed and scanned to Media and sent to medical records.  Symptom Management:  Pruritis- hydrocortisone cream 1% Pain- acetaminophen prn   Palliative Prophylaxis:  Indigestion- aluminum and magnesium hydroxide-simethicone suspension GI prophjylaxis- polyethylene glycol prn GERD- Pantoprazole prn Difficulty sleeping- melatonin prn  Additional Recommendations (Limitations, Scope, Preferences): Full scope of care per patient wishes  Psycho-social/Spiritual:  Desire for further Chaplaincy support: No Additional Recommendations:   Full scope of treatment per patient's wishes.  Complete advanced directive Monday when notary is available. Follow for symptom management and continued [place] conversations if condition changes.   Prognosis: Seriously ill patient with heart failure that may be end stage and AKI on stage IIIb CKD with multiple comorbidites. He may not be a candidate for hemodialysis long term.   Discharge Planning: Mr. Face would like a different SNF for rehab. He does not feel he is getting the care he needs at [facility].    Vitals with BMI 11/28/2020 11/28/2020 11/28/2020  Height - - -  Weight - - -  BMI - - -  Systolic 696 295 284  Diastolic 52 53 52  Pulse 132 111 104    PPS: 40%   This conversation/these recommendations were messaged with patient primary care team, Dr. Valeta Harms.  Thank you for the opportunity to participate in the care of this patient and family.   Time In:3:00  Time Out:4:20 Total Time: >70 minutes Greater than 50%  of this time was spent counseling and coordinating care related to the above assessment and plan.  Lindell Spar, NP [facility] Palliative Medicine Team Team Cell Phone: [phone] Please utilize secure chat with additional questions, if there is no response within 30 minutes please call the above phone number  Palliative Medicine Team providers are available by phone from 7am to 7pm daily and  can be reached through the team cell phone.  Should this patient require assistance outside of these hours, please call the patient's attending physician.

## 2020-11-28 NOTE — Progress Notes (Addendum)
Patient ID: William Flores, male   DOB: 09-Oct-1950, 70 y.o.   MRN: [id number]    Advanced Heart Failure Rounding Note   Subjective:    On milrinone 0.125, NE 5. Co-ox 83%.  Now on CVVH, pulling up to 250 cc/hr UF.  Still making some urine.  CVP this morning is 12-13.    He is on Bipap, will open eyes and respond/follow commands.     Objective:   Weight Range:  Vital Signs:   Temp:  [97.3 F (36.3 C)-98.6 F (37 C)] 97.8 F (36.6 C) (11/26 0600) Pulse Rate:  [76-107] 100 (11/26 0700) Resp:  [13-23] 21 (11/26 0700) BP: (78-128)/(33-105) 109/52 (11/26 0700) SpO2:  [93 %-100 %] 98 % (11/26 0700) FiO2 (%):  [40 %-60 %] 50 % (11/26 0700) Weight:  [129.3 kg] 129.3 kg (11/26 0324) Last BM Date: 11/25/20  Weight change: Filed Weights   11/26/20 0317 11/27/20 0524 11/28/20 0324  Weight: 127 kg 129.1 kg 129.3 kg    Intake/Output:   Intake/Output Summary (Last 24 hours) at 11/28/2020 0721 Last data filed at 11/28/2020 0700 Gross per 24 hour  Intake 791.89 ml  Output 4797 ml  Net -4005.11 ml    CVP 12-13 personally checked.  General: NAD, Bipap Neck: JVP 12 cm, no thyromegaly or thyroid nodule.  Lungs: Decreased at bases.  CV: Nondisplaced PMI.  Heart irregular S1/S2, no S3/S4, no murmur.  1+ edema to knees.  Abdomen: Soft, nontender, no hepatosplenomegaly, no distention.  Skin: Intact without lesions or rashes.  Neurologic: Will open eyes, follow commands.  Extremities: No clubbing or cyanosis.  HEENT: Normal.   Telemetry: AF 90-100s (personally reviewed)  Labs: Basic Metabolic Panel: Recent Labs  Lab 11/22/20 0424 11/23/20 0422 11/25/20 0255 11/26/20 0450 11/27/20 0500 11/27/20 1638 11/27/20 1941 11/27/20 1953 11/28/20 0316  NA 140   < > 136 135 135 135 137  --  131*  K 4.2   < > 4.5 3.8 4.4 4.2 4.3  --  4.2  CL 101   < > 97* 97* 95* 97*  --   --  97*  CO2 30   < > 30 31 33* 32  --   --  29  GLUCOSE 134*   < > 171* 210* 172* 127*  --   --  249*  BUN 58*    < > 71* 68* 72* 62*  --   --  45*  CREATININE 2.08*   < > 2.48* 2.46* 2.82* 2.43*  --   --  2.00*  CALCIUM 8.6*   < > 8.6* 8.1* 8.3* 8.0*  --   --  7.8*  MG 2.5*  --   --  2.4  --   --   --  2.6* 2.4  PHOS 4.6  --   --   --   --  4.7*  --   --  3.4   < > = values in this interval not displayed.    Liver Function Tests: Recent Labs  Lab 11/22/20 0424 11/23/20 0422 11/27/20 1638 11/28/20 0316  AST 19 16  --   --   ALT 16 14  --   --   ALKPHOS 79 70  --   --   BILITOT 0.9 1.0  --   --   PROT 6.8 6.4*  --   --   ALBUMIN 3.4* 3.2* 2.8* 2.6*   No results for input(s): LIPASE, AMYLASE in the last 168 hours. Recent Labs  Lab  11/22/20 0425 11/25/20 0945 11/27/20 1145  AMMONIA 35 48* 49*    CBC: Recent Labs  Lab 11/22/20 0424 11/23/20 0422 11/24/20 0620 11/25/20 0255 11/26/20 0450 11/27/20 0500 11/27/20 1941 11/28/20 0316  WBC 4.8   < > 4.7 4.2 4.8 5.0  --  4.9  NEUTROABS 3.6  --   --   --   --   --   --   --   HGB 8.9*   < > 9.5* 8.6* 7.7* 8.2* 10.5* 7.8*  HCT 32.8*   < > 35.0* 31.5* 28.6* 30.6* 31.0* 29.4*  MCV 94.3   < > 95.1 93.2 92.6 93.0  --  92.2  PLT 102*   < > 94* 91* 94* 98*  --  98*   < > = values in this interval not displayed.    Cardiac Enzymes: No results for input(s): CKTOTAL, CKMB, CKMBINDEX, TROPONINI in the last 168 hours.  BNP: BNP (last 3 results) Recent Labs    10/17/20 0428 11/20/20 1128 11/22/20 0425  BNP 290.0* 306.1* 270.0*    ProBNP (last 3 results) No results for input(s): PROBNP in the last 8760 hours.    Other results:  Imaging: DG CHEST PORT 1 VIEW  Result Date: 11/27/2020 CLINICAL DATA:  Central line placement. EXAM: PORTABLE CHEST 1 VIEW COMPARISON:  Same day. FINDINGS: Interval placement right internal jugular catheter with distal tip in expected position of the SVC. No pneumothorax is noted. IMPRESSION: Interval placement of right internal jugular catheter with distal tip in expected position of the SVC. No  pneumothorax is noted. Electronically Signed   By: Marijo Conception M.D.   On: 11/27/2020 13:51   DG CHEST PORT 1 VIEW  Result Date: 11/27/2020 CLINICAL DATA:  Dyspnea. Cough and congestion and shortness of breath. EXAM: PORTABLE CHEST 1 VIEW COMPARISON:  11/25/2020 and older studies. FINDINGS: Cardiac silhouette is borderline enlarged. No mediastinal or hilar masses. There is hazy opacity throughout right hemithorax most prominent at the base, consistent with a combination of layering pleural fluid and airspace lung opacity. This has increased from the prior exam. There is also opacity at the left lung base, not well defined, consistent with atelectasis with a probable small effusion. Mid to upper left lung is clear. No pneumothorax. Right-sided PICC is stable. IMPRESSION: 1. Interval worsening of lung aeration on the right, although the apparent change may be due to differences in patient positioning and technique. Right-sided opacity is consistent with a combination of layering right pleural fluid and parenchymal lung opacity, the latter finding which may reflect asymmetric edema or pneumonia. 2. Mild stable opacity at the left lung base consistent with atelectasis with a suspected small effusion. Electronically Signed   By: Lajean Manes M.D.   On: 11/27/2020 10:24     Medications:     Scheduled Medications:  allopurinol  300 mg Oral Daily   atorvastatin  20 mg Oral Daily   Chlorhexidine Gluconate Cloth  6 each Topical Daily   [START ON 11/29/2020] darbepoetin (ARANESP) injection - NON-DIALYSIS  100 mcg Subcutaneous Q Sun-1800   insulin aspart  0-5 Units Subcutaneous QHS   insulin aspart  0-9 Units Subcutaneous TID WC   insulin glargine-yfgn  16 Units Subcutaneous QHS   lactulose  20 g Oral BID   mouth rinse  15 mL Mouth Rinse BID   midodrine  5 mg Oral TID WC   pantoprazole  40 mg Oral Daily   polyethylene glycol  17 g Oral Daily  sildenafil  20 mg Oral TID   sodium chloride flush   10-40 mL Intracatheter Q12H   tamsulosin  0.4 mg Oral Daily    Infusions:   prismasol BGK 4/2.5 500 mL/hr at 11/27/20 2359    prismasol BGK 4/2.5 200 mL/hr at 11/27/20 1400   sodium chloride 5 mL/hr at 11/28/20 0700   ferric gluconate (FERRLECIT) IVPB Stopped (11/27/20 1921)   milrinone 0.125 mcg/kg/min (11/28/20 0700)   norepinephrine (LEVOPHED) Adult infusion 5 mcg/min (11/28/20 0700)   prismasol BGK 4/2.5 1,500 mL/hr at 11/28/20 0712    PRN Medications: sodium chloride, acetaminophen, alum & mag hydroxide-simeth, guaiFENesin-dextromethorphan, heparin, heparin, hydrocortisone cream, melatonin, sodium chloride flush   Assessment/Plan:    1. Cor pulmonale/RV failure: Suspect primarily due to OHS/OSA. Echo in 4/21 with EF 55-60%, RV severely dilated w/ severe RV dysfunction in the setting of severe pulmonary hypertension w/ PASP 76 mmHg. Concrete 4/21 with elevated filling pressures, mixed pulmonary venous/pulmonary hypertension=> diuresed w/ IV Lasix in hospital and placed on po torsemide. Echo in 11/22 showed EF 55-60%, mod LVH, D-shaped septum, severe RV enlargement with severely decreased RV systolic function, IVC dilated. NYHA class IV. On milrinone 0.125 + NE 5 with ongoing CVVH.  CVP 12-13 today with co-ox 83%. - Continue milrinone low dose for RV support.   - Continue NE as needed to maintain MAP, will add midodrine 5 mg tid.  - Continue CVVH, would aim for net negative 100-150 cc/hr UF today.  2. Pulmonary HTN: PASP elevated at 76 mmHg by 4/21 echo estimate with RV systolic function severely reduced. Most likely poorly controlled OHS/OSA as cause (group 3 PH).  V/Q scan negative for chronic PE.  Mixed PVH/PAH on RHC.  Echo in 10/22 with severe RV failure and again this admission.   - Continue sildenafil 20 mg tid.  - Continue home oxygen and CPAP at night.    3. AKI on Stage IIIb CKD: Now on CVVH.  I think he would be a poor long-term HD candidate.  He is still making some urine.   4.  Atrial fibrillation: Chronic, unlikely to be able to successfully cardiovert given long-standing atrial fibrillation.  On warfarin with INR 2.9.  - Hold Toprol XL while on pressor, can use amiodarone gtt if needed for rate control.    - Continue warfarin (has not had coverage for DOACs).  5. OSA/OHS: Currently on Bipap.  At home, CPAP + oxygen.   - Repeat ABG this morning, see if we can get him off CPAP.   6. Diabetes: - Management per primary team.  7. MGUS: Previously followed by hem/onc but has not seen in several years.  Multiple myeloma panel 4/21 with presence of M-spike. 8. Cirrhosis: ?Due to NAFLD.  11/23 Ammonia 48. Paracentesis 2.4 L this admission.  - Continue lactulose.  9. Anemia: Hgb 7.8. No obvious source of bleeding.  Has had IV Fe.  - Nephrology consulted.  10. Neuro: On Bipap this morning but follows commands.   11. Thrombocytopenia: In setting of acute inflammatory illness.  12. GOC: I will consult palliative care for goals of care.  Lives in SNF, not a good long-term HD candidate.  Suspect end stage RV failure.   CRITICAL CARE Performed by: Loralie Champagne  Total critical care time: 35 minutes  Critical care time was exclusive of separately billable procedures and treating other patients.  Critical care was necessary to treat or prevent imminent or life-threatening deterioration.  Critical care was time spent personally by  me (independent of midlevel providers or residents) on the following activities: development of treatment plan with patient and/or surrogate as well as nursing, discussions with consultants, evaluation of patient's response to treatment, examination of patient, obtaining history from patient or surrogate, ordering and performing treatments and interventions, ordering and review of laboratory studies, ordering and review of radiographic studies, pulse oximetry and re-evaluation of patient's condition.  Loralie Champagne, MD  7:21 AM

## 2020-11-28 NOTE — Progress Notes (Signed)
Patient ID: William Flores, male   DOB: 09-29-1950, 70 y.o.   MRN: [id number] [facility] Progress Note   Assessment/ Plan:   1.  Acute kidney injury on chronic kidney disease stage IIIb: Likely hemodynamically mediated in the setting of acute exacerbation of congestive heart failure.  Started on CRRT yesterday because he was refractory to escalating diuretics and was showing signs of azotemia/uremia with impending respiratory failure.  Maintain current CRRT prescription with net -200 to 250 cc/h.  At this point, unclear if he would be a good long-term dialysis candidate. 2.  Acute exacerbation of diastolic heart failure: With severe pulmonary hypertension and significant third spacing/volume overload as corroborated by elevated CVP.  Continue to monitor with ultrafiltration on CRRT. 3.  Encephalopathy: Likely multifactorial with hypercapnic respiratory failure/CO2 narcosis and azotemia contributing to possible hepatic encephalopathy with slightly elevated ammonia levels.  Mental status appears to be improving on CRRT. 4.  Anemia of chronic illness: He has severe iron deficiency with an iron saturation of 4% and ferritin of 31.  Continue intravenous iron and ESA 5.  Chronic atrial fibrillation: Intermittent tachycardia noted on metoprolol.  On anticoagulation with Coumadin 6.  Thrombocytopenia: Likely from splenic sequestration in the setting of cirrhosis, no evidence of bleeding diathesis.  Subjective:   Initially with some problems tolerating ultrafiltration but has done better following addition of Levophed.   Objective:   BP (!) 107/57   Pulse 96   Temp 97.8 F (36.6 C) (Axillary)   Resp 20   Ht 5' 7"  (1.702 m)   Wt 129.3 kg   SpO2 94%   BMI 44.65 kg/m   Intake/Output Summary (Last 24 hours) at 11/28/2020 9476 Last data filed at 11/28/2020 0600 Gross per 24 hour  Intake 763.36 ml  Output 4548 ml  Net -3784.64 ml   Weight change: 0.2 kg  Physical Exam: Gen:  Comfortably resting in bed, on BiPAP CVS: Pulse regular rhythm, normal rate, S1 and S2 normal Resp: Distant breath sounds bilaterally, no distinct rales or rhonchi Abd: Soft, obese/moderately distended, nontender, bowel sounds normal Ext: Bilateral lower extremities in Unna boot with pitting edema up to thighs  Imaging: DG CHEST PORT 1 VIEW  Result Date: 11/27/2020 CLINICAL DATA:  Central line placement. EXAM: PORTABLE CHEST 1 VIEW COMPARISON:  Same day. FINDINGS: Interval placement right internal jugular catheter with distal tip in expected position of the SVC. No pneumothorax is noted. IMPRESSION: Interval placement of right internal jugular catheter with distal tip in expected position of the SVC. No pneumothorax is noted. Electronically Signed   By: Marijo Conception M.D.   On: 11/27/2020 13:51   DG CHEST PORT 1 VIEW  Result Date: 11/27/2020 CLINICAL DATA:  Dyspnea. Cough and congestion and shortness of breath. EXAM: PORTABLE CHEST 1 VIEW COMPARISON:  11/25/2020 and older studies. FINDINGS: Cardiac silhouette is borderline enlarged. No mediastinal or hilar masses. There is hazy opacity throughout right hemithorax most prominent at the base, consistent with a combination of layering pleural fluid and airspace lung opacity. This has increased from the prior exam. There is also opacity at the left lung base, not well defined, consistent with atelectasis with a probable small effusion. Mid to upper left lung is clear. No pneumothorax. Right-sided PICC is stable. IMPRESSION: 1. Interval worsening of lung aeration on the right, although the apparent change may be due to differences in patient positioning and technique. Right-sided opacity is consistent with a combination of layering right pleural fluid and parenchymal lung  opacity, the latter finding which may reflect asymmetric edema or pneumonia. 2. Mild stable opacity at the left lung base consistent with atelectasis with a suspected small effusion.  Electronically Signed   By: Lajean Manes M.D.   On: 11/27/2020 10:24    Labs: BMET Recent Labs  Lab 11/22/20 0424 11/23/20 0422 11/24/20 0620 11/25/20 0255 11/26/20 0450 11/27/20 0500 11/27/20 1638 11/27/20 1941 11/28/20 0316  NA 140 138 138 136 135 135 135 137 131*  K 4.2 4.3 4.4 4.5 3.8 4.4 4.2 4.3 4.2  CL 101 100 99 97* 97* 95* 97*  --  97*  CO2 30 31 31 30 31  33* 32  --  29  GLUCOSE 134* 137* 132* 171* 210* 172* 127*  --  249*  BUN 58* 64* 68* 71* 68* 72* 62*  --  45*  CREATININE 2.08* 2.28* 2.43* 2.48* 2.46* 2.82* 2.43*  --  2.00*  CALCIUM 8.6* 8.5* 8.7* 8.6* 8.1* 8.3* 8.0*  --  7.8*  PHOS 4.6  --   --   --   --   --  4.7*  --  3.4   CBC Recent Labs  Lab 11/22/20 0424 11/23/20 0422 11/25/20 0255 11/26/20 0450 11/27/20 0500 11/27/20 1941 11/28/20 0316  WBC 4.8   < > 4.2 4.8 5.0  --  4.9  NEUTROABS 3.6  --   --   --   --   --   --   HGB 8.9*   < > 8.6* 7.7* 8.2* 10.5* 7.8*  HCT 32.8*   < > 31.5* 28.6* 30.6* 31.0* 29.4*  MCV 94.3   < > 93.2 92.6 93.0  --  92.2  PLT 102*   < > 91* 94* 98*  --  98*   < > = values in this interval not displayed.    Medications:     allopurinol  300 mg Oral Daily   atorvastatin  20 mg Oral Daily   Chlorhexidine Gluconate Cloth  6 each Topical Daily   [START ON 11/29/2020] darbepoetin (ARANESP) injection - NON-DIALYSIS  100 mcg Subcutaneous Q Sun-1800   insulin aspart  0-5 Units Subcutaneous QHS   insulin aspart  0-9 Units Subcutaneous TID WC   insulin glargine-yfgn  16 Units Subcutaneous QHS   lactulose  20 g Oral BID   mouth rinse  15 mL Mouth Rinse BID   metoprolol succinate  25 mg Oral Daily   pantoprazole  40 mg Oral Daily   polyethylene glycol  17 g Oral Daily   sildenafil  20 mg Oral TID   sodium chloride flush  10-40 mL Intracatheter Q12H   tamsulosin  0.4 mg Oral Daily   Elmarie Shiley, MD 11/28/2020, 6:52 AM

## 2020-11-29 DIAGNOSIS — I272 Pulmonary hypertension, unspecified: Secondary | ICD-10-CM | POA: Diagnosis not present

## 2020-11-29 DIAGNOSIS — I4891 Unspecified atrial fibrillation: Secondary | ICD-10-CM | POA: Diagnosis not present

## 2020-11-29 DIAGNOSIS — R188 Other ascites: Secondary | ICD-10-CM | POA: Diagnosis not present

## 2020-11-29 DIAGNOSIS — R0600 Dyspnea, unspecified: Secondary | ICD-10-CM | POA: Diagnosis not present

## 2020-11-29 DIAGNOSIS — I5031 Acute diastolic (congestive) heart failure: Secondary | ICD-10-CM | POA: Diagnosis not present

## 2020-11-29 DIAGNOSIS — J9611 Chronic respiratory failure with hypoxia: Secondary | ICD-10-CM | POA: Diagnosis not present

## 2020-11-29 DIAGNOSIS — I5033 Acute on chronic diastolic (congestive) heart failure: Secondary | ICD-10-CM | POA: Diagnosis not present

## 2020-11-29 DIAGNOSIS — Z7901 Long term (current) use of anticoagulants: Secondary | ICD-10-CM | POA: Diagnosis not present

## 2020-11-29 DIAGNOSIS — N179 Acute kidney failure, unspecified: Secondary | ICD-10-CM | POA: Diagnosis not present

## 2020-11-29 DIAGNOSIS — I50810 Right heart failure, unspecified: Secondary | ICD-10-CM | POA: Diagnosis not present

## 2020-11-29 DIAGNOSIS — N1832 Chronic kidney disease, stage 3b: Secondary | ICD-10-CM | POA: Diagnosis not present

## 2020-11-29 DIAGNOSIS — R0689 Other abnormalities of breathing: Secondary | ICD-10-CM

## 2020-11-29 LAB — CBC
HCT: 28.3 % — ABNORMAL LOW (ref 39.0–52.0)
Hemoglobin: 7.7 g/dL — ABNORMAL LOW (ref 13.0–17.0)
MCH: 25.1 pg — ABNORMAL LOW (ref 26.0–34.0)
MCHC: 27.2 g/dL — ABNORMAL LOW (ref 30.0–36.0)
MCV: 92.2 fL (ref 80.0–100.0)
Platelets: 113 10*3/uL — ABNORMAL LOW (ref 150–400)
RBC: 3.07 MIL/uL — ABNORMAL LOW (ref 4.22–5.81)
RDW: 19.5 % — ABNORMAL HIGH (ref 11.5–15.5)
WBC: 4.4 10*3/uL (ref 4.0–10.5)
nRBC: 1.4 % — ABNORMAL HIGH (ref 0.0–0.2)

## 2020-11-29 LAB — RENAL FUNCTION PANEL
Albumin: 2.5 g/dL — ABNORMAL LOW (ref 3.5–5.0)
Albumin: 2.7 g/dL — ABNORMAL LOW (ref 3.5–5.0)
Albumin: 2.6 g/dL — ABNORMAL LOW (ref 3.5–5.0)
Anion gap: 3 — ABNORMAL LOW (ref 5–15)
Anion gap: 7 (ref 5–15)
Anion gap: 7 (ref 5–15)
BUN: 26 mg/dL — ABNORMAL HIGH (ref 8–23)
BUN: 22 mg/dL (ref 8–23)
BUN: 22 mg/dL (ref 8–23)
CO2: 29 mmol/L (ref 22–32)
CO2: 28 mmol/L (ref 22–32)
CO2: 26 mmol/L (ref 22–32)
Calcium: 8 mg/dL — ABNORMAL LOW (ref 8.9–10.3)
Calcium: 8.1 mg/dL — ABNORMAL LOW (ref 8.9–10.3)
Calcium: 7.9 mg/dL — ABNORMAL LOW (ref 8.9–10.3)
Chloride: 103 mmol/L (ref 98–111)
Chloride: 100 mmol/L (ref 98–111)
Chloride: 102 mmol/L (ref 98–111)
Creatinine, Ser: 1.68 mg/dL — ABNORMAL HIGH (ref 0.61–1.24)
Creatinine, Ser: 1.69 mg/dL — ABNORMAL HIGH (ref 0.61–1.24)
Creatinine, Ser: 1.9 mg/dL — ABNORMAL HIGH (ref 0.61–1.24)
GFR, Estimated: 44 mL/min — ABNORMAL LOW (ref >60)
GFR, Estimated: 44 mL/min — ABNORMAL LOW (ref >60)
GFR, Estimated: 38 mL/min — ABNORMAL LOW (ref >60)
Glucose, Bld: 120 mg/dL — ABNORMAL HIGH (ref 70–99)
Glucose, Bld: 219 mg/dL — ABNORMAL HIGH (ref 70–99)
Glucose, Bld: 150 mg/dL — ABNORMAL HIGH (ref 70–99)
Phosphorus: 2.2 mg/dL — ABNORMAL LOW (ref 2.5–4.6)
Phosphorus: 10.8 mg/dL — ABNORMAL HIGH (ref 2.5–4.6)
Phosphorus: 3.9 mg/dL (ref 2.5–4.6)
Potassium: 4.2 mmol/L (ref 3.5–5.1)
Potassium: 4.4 mmol/L (ref 3.5–5.1)
Potassium: 4.4 mmol/L (ref 3.5–5.1)
Sodium: 135 mmol/L (ref 135–145)
Sodium: 135 mmol/L (ref 135–145)
Sodium: 135 mmol/L (ref 135–145)

## 2020-11-29 LAB — CULTURE, BODY FLUID W GRAM STAIN -BOTTLE: Culture: NO GROWTH

## 2020-11-29 LAB — COOXEMETRY PANEL
Carboxyhemoglobin: 1.7 % — ABNORMAL HIGH (ref 0.5–1.5)
Methemoglobin: 1 % (ref 0.0–1.5)
O2 Saturation: 80.4 %
Total hemoglobin: 7.7 g/dL — ABNORMAL LOW (ref 12.0–16.0)

## 2020-11-29 LAB — GLUCOSE, CAPILLARY
Glucose-Capillary: 117 mg/dL — ABNORMAL HIGH (ref 70–99)
Glucose-Capillary: 141 mg/dL — ABNORMAL HIGH (ref 70–99)
Glucose-Capillary: 137 mg/dL — ABNORMAL HIGH (ref 70–99)
Glucose-Capillary: 153 mg/dL — ABNORMAL HIGH (ref 70–99)

## 2020-11-29 LAB — MAGNESIUM: Magnesium: 2.4 mg/dL (ref 1.7–2.4)

## 2020-11-29 LAB — PROTIME-INR
INR: 2.4 — ABNORMAL HIGH (ref 0.8–1.2)
Prothrombin Time: 26.4 seconds — ABNORMAL HIGH (ref 11.4–15.2)

## 2020-11-29 MED ORDER — AMIODARONE HCL IN DEXTROSE 360-4.14 MG/200ML-% IV SOLN
60.0000 mg/h | INTRAVENOUS | Status: AC
  Administered 2020-11-29 (×2): 60 mg/h via INTRAVENOUS
  Filled 2020-11-29: qty 200

## 2020-11-29 MED ORDER — AMIODARONE HCL IN DEXTROSE 360-4.14 MG/200ML-% IV SOLN
30.0000 mg/h | INTRAVENOUS | Status: DC
  Administered 2020-11-29 – 2020-11-30 (×4): 30 mg/h via INTRAVENOUS
  Filled 2020-11-29 (×3): qty 200

## 2020-11-29 MED ORDER — AMIODARONE HCL IN DEXTROSE 360-4.14 MG/200ML-% IV SOLN
INTRAVENOUS | Status: AC
  Administered 2020-11-29: 07:00:00 150 mg via INTRAVENOUS
  Filled 2020-11-29: qty 200

## 2020-11-29 MED ORDER — AMIODARONE LOAD VIA INFUSION
150.0000 mg | Freq: Once | INTRAVENOUS | Status: AC
  Filled 2020-11-29: qty 83.34

## 2020-11-29 MED ORDER — ZOLPIDEM TARTRATE 5 MG PO TABS
5.0000 mg | ORAL_TABLET | Freq: Once | ORAL | Status: AC
  Administered 2020-11-29: 01:00:00 5 mg via ORAL
  Filled 2020-11-29: qty 1

## 2020-11-29 MED ORDER — SODIUM PHOSPHATES 45 MMOLE/15ML IV SOLN
30.0000 mmol | Freq: Once | INTRAVENOUS | Status: AC
  Administered 2020-11-29: 13:00:00 30 mmol via INTRAVENOUS
  Filled 2020-11-29: qty 10

## 2020-11-29 NOTE — Progress Notes (Signed)
NAME:  William Flores, MRN:  [id number], DOB:  1950/06/10, LOS: 7 ADMISSION DATE:  11/22/2020, CONSULTATION DATE: 11/27/2020 REFERRING MD: [facility], CHIEF COMPLAINT: Volume overload renal failure  History of Present Illness:  William Flores is a 70 year old nursing home resident with acute on chronic right heart failure cor pulmonale severe pulmonary hypertension, metabolic encephalopathy, acute on chronic anemia chronic with acute respiratory failure, cirrhosis secondary to William Flores, chronic atrial fibrillation acute kidney injury, thrombocytopenia diabetes mellitus obstructive sleep apnea who presents with worsening volume overload and pulmonary critical care is asked to transfer to intensive care place a hemodialysis catheter so that he can undergo CRRT.  Pertinent  Medical History   Past Medical History:  Diagnosis Date   CAD (coronary artery disease)    LHC 2/08:  mRCA 50%, o/w no sig CAD, EF 25%   Cardiomyopathy ([place])    EF 35-40% in 2/16 in setting of AF with RVR >> echo 5/16 with improved LVF with EF 65-70%   Chronic atrial fibrillation (HCC)    Chronic diastolic heart failure (HCC)    HFPF - EF ~65-70% (OFTEN EXACERBATED BY AFIB)   CKD (chronic kidney disease)    hx of worsening renal failure and hyperK+ in setting of acute diast CHF >> required CVVHD in 4/16 and dialysis x 1 in 5/16   CKD stage 3 due to type 2 diabetes mellitus ([place]) 09/17/2015   Colon cancer ([place])    Colon polyps 09/21/2012   Tubular adenoma   Diabetes ([place])    Hyperlipidemia    Hypertension    Obesity hypoventilation syndrome ([place])    Renal insufficiency    Sleep apnea     Significant Hospital Events: Including procedures, antibiotic start and stop dates in addition to other pertinent events   11/27/2020 consult to pulmonary critical care 11/27/2020 transfer to intensive care unit place hemodialysis catheter  Interim History / Subjective:  Patient went to A. fib with RVR after episodes of NSVT x3 last night,  requiring amiodarone bolus and then infusion Remain on CRRT, made 350 cc urine  Objective   Blood pressure 107/62, pulse (!) 119, temperature 98.5 F (36.9 C), temperature source Oral, resp. rate (!) 27, height 5' 7"  (1.702 m), weight 125.3 kg, SpO2 93 %. CVP:  [4 mmHg-12 mmHg] 7 mmHg  FiO2 (%):  [50 %] 50 %   Intake/Output Summary (Last 24 hours) at 11/29/2020 0747 Last data filed at 11/29/2020 0700 Gross per 24 hour  Intake 1678.09 ml  Output 4098 ml  Net -2419.91 ml   Filed Weights   11/27/20 0524 11/28/20 0324 11/29/20 0500  Weight: 129.1 kg 129.3 kg 125.3 kg    Examination: General: Chronically ill-appearing morbidly obese male, lying on the bed HEENT: [place]/AT, eyes anicteric.  moist mucus membranes.  On high flow nasal cannula oxygen Neuro: Alert, awake following commands Chest: Faint bilateral crackles, no wheezes or rhonchi Heart: irregularly irregular, no murmurs or gallops Abdomen: Soft, nontender, nondistended, bowel sounds present Skin: Chronic skin changes in bilateral lower extremities  Resolved Hospital Problem list     Assessment & Plan:  Acute on chronic systolic right heart failure with cor pulmonale and severe pulmonary hypertension with worsening volume overload. Patient was refractory to diuretic therapy Continue CVVHD, will try to remove 150-200 cc volume per hour Continue midodrine 5 mg 3 times daily Currently on Levophed 1 mcg  OSA Nocturnal BIPAP PRN BIPAP during day   Chronic A. Fib with rapid ventricular response Patient went into A.  fib with RVR last night, requiring amiodarone bolus and currently on amiodarone infusion Heart rate is better controlled now Continue Coumadin for stroke prophylaxis   Acute on chronic hypoxic/hypercapnic respiratory failure Continue BiPAP at night and high flow nasal cannula during daytime Titrate FiO2 with O2 sat goal >92%  AKI on CKD stage IIIb Patient is currently on CVVHD Closely monitor urine  output  Cirrhosis with elevated ammonia level status post paracentesis with 2.4 L removed CVVHD for volume removal Possibly repeat para in future   Anemia of chronic disease Monitor H&H Transfuse if <7  Thrombocytopenia Platelet count remained stable close to 100 Closely monitor  Diabetes mellitus Fingersticks are controlled now Continue Lantus and sliding scale insulin with goal CBG 226-333  Acute metabolic encephalopathy Related to multiple issues outline above  Continue to observe  He is protecting his airway Avoid sedation  Morbid obesity Dietitian follow-up   Best Practice (right click and "Reselect all SmartList Selections" daily)   Diet/type: NPO pending speech and swallow evaluation DVT prophylaxis: Coumadin GI prophylaxis: PPI Lines: N/A Foley:  N/A Code Status:  full code Last date of multidisciplinary goals of care discussion. Continue palliative involvement pending trajectory.   Labs   CBC: Recent Labs  Lab 11/25/20 0255 11/26/20 0450 11/27/20 0500 11/27/20 1941 11/28/20 0316 11/29/20 0320  WBC 4.2 4.8 5.0  --  4.9 4.4  HGB 8.6* 7.7* 8.2* 10.5* 7.8* 7.7*  HCT 31.5* 28.6* 30.6* 31.0* 29.4* 28.3*  MCV 93.2 92.6 93.0  --  92.2 92.2  PLT 91* 94* 98*  --  98* 113*    Basic Metabolic Panel: Recent Labs  Lab 11/26/20 0450 11/27/20 0500 11/27/20 1638 11/27/20 1941 11/27/20 1953 11/28/20 0316 11/28/20 1600 11/29/20 0320  NA 135 135 135 137  --  131* 134* 135  K 3.8 4.4 4.2 4.3  --  4.2 4.3 4.2  CL 97* 95* 97*  --   --  97* 101 103  CO2 31 33* 32  --   --  29 29 29   GLUCOSE 210* 172* 127*  --   --  249* 195* 120*  BUN 68* 72* 62*  --   --  45* 32* 26*  CREATININE 2.46* 2.82* 2.43*  --   --  2.00* 1.71* 1.68*  CALCIUM 8.1* 8.3* 8.0*  --   --  7.8* 8.0* 8.0*  MG 2.4  --   --   --  2.6* 2.4  --  2.4  PHOS  --   --  4.7*  --   --  3.4 2.8 2.2*   GFR: Estimated Creatinine Clearance: 53.5 mL/min (A) (by C-G formula based on SCr of 1.68 mg/dL  (H)). Recent Labs  Lab 11/26/20 0450 11/27/20 0500 11/27/20 1100 11/28/20 0316 11/29/20 0320  WBC 4.8 5.0  --  4.9 4.4  LATICACIDVEN  --   --  0.7  --   --     Liver Function Tests: Recent Labs  Lab 11/23/20 0422 11/27/20 1638 11/28/20 0316 11/28/20 1600 11/29/20 0320  AST 16  --   --   --   --   ALT 14  --   --   --   --   ALKPHOS 70  --   --   --   --   BILITOT 1.0  --   --   --   --   PROT 6.4*  --   --   --   --   ALBUMIN 3.2* 2.8* 2.6*  2.7* 2.5*   No results for input(s): LIPASE, AMYLASE in the last 168 hours. Recent Labs  Lab 11/25/20 0945 11/27/20 1145  AMMONIA 48* 49*    ABG    Component Value Date/Time   PHART 7.326 (L) 11/27/2020 1941   PCO2ART 59.6 (H) 11/27/2020 1941   PO2ART 51 (L) 11/27/2020 1941   HCO3 31.5 (H) 11/27/2020 1941   TCO2 33 (H) 11/27/2020 1941   ACIDBASEDEF 1.0 05/16/2014 0929   O2SAT 80.4 11/29/2020 0320     Coagulation Profile: Recent Labs  Lab 11/25/20 0255 11/26/20 0450 11/27/20 0500 11/28/20 0316 11/29/20 0320  INR 2.5* 3.1* 2.8* 2.9* 2.4*    Cardiac Enzymes: No results for input(s): CKTOTAL, CKMB, CKMBINDEX, TROPONINI in the last 168 hours.  HbA1C: HB A1C (BAYER DCA - WAIVED)  Date/Time Value Ref Range Status  03/19/2020 01:11 PM 9.6 (H) <7.0 % Final    Comment:                                          Diabetic Adult            <7.0                                       Healthy Adult        4.3 - 5.7                                                           (DCCT/NGSP) American Diabetes Association's Summary of Glycemic Recommendations for Adults with Diabetes: Hemoglobin A1c <7.0%. More stringent glycemic goals (A1c <6.0%) may further reduce complications at the cost of increased risk of hypoglycemia.   12/20/2019 04:15 PM 9.9 (H) <7.0 % Final    Comment:                                          Diabetic Adult            <7.0                                       Healthy Adult        4.3 - 5.7                                                            (DCCT/NGSP) American Diabetes Association's Summary of Glycemic Recommendations for Adults with Diabetes: Hemoglobin A1c <7.0%. More stringent glycemic goals (A1c <6.0%) may further reduce complications at the cost of increased risk of hypoglycemia.    Hgb A1c MFr Bld  Date/Time Value Ref Range Status  10/14/2020 02:39 PM 7.1 (H) 4.8 - 5.6 % Final    Comment:    (NOTE) Pre diabetes:  5.7%-6.4%  Diabetes:              >6.4%  Glycemic control for   <7.0% adults with diabetes   06/22/2020 09:34 AM 8.2 (H) 4.8 - 5.6 % Final    Comment:    (NOTE)         Prediabetes: 5.7 - 6.4         Diabetes: >6.4         Glycemic control for adults with diabetes: <7.0     CBG: Recent Labs  Lab 11/28/20 0636 11/28/20 1117 11/28/20 1557 11/28/20 2121 11/29/20 0632  GLUCAP 129* 105* 157* 175* 117*    Total critical care time: 38 minutes  Performed by: [place] care time was exclusive of separately billable procedures and treating other patients.   Critical care was necessary to treat or prevent imminent or life-threatening deterioration.   Critical care was time spent personally by me on the following activities: development of treatment plan with patient and/or surrogate as well as nursing, discussions with consultants, evaluation of patient's response to treatment, examination of patient, obtaining history from patient or surrogate, ordering and performing treatments and interventions, ordering and review of laboratory studies, ordering and review of radiographic studies, pulse oximetry and re-evaluation of patient's condition.   Jacky Kindle MD [facility] See Amion for pager If no response to pager, please call [phone] until 7pm After 7pm, Please call E-link [phone]

## 2020-11-29 NOTE — Progress Notes (Signed)
Patient ID: William Flores, male   DOB: 15-Jun-1950, 70 y.o.   MRN: [id number]    Advanced Heart Failure Rounding Note   Subjective:    On milrinone 0.125, NE 1. Co-ox 80%.  Now on CVVH, pulling net 150 cc/hr UF.  270 cc UOP yesterday.  CVP this morning is 10.  Weight down 9 lbs.    He is in atrial fibrillation, rate currently 100s but up to 120s and now on amiodarone gtt.  Had runs NSVT overnight.   He is now on [place], alert/oriented.  Complains of itching from [place] boots.    Objective:   Weight Range:  Vital Signs:   Temp:  [97.8 F (36.6 C)-98.8 F (37.1 C)] 98.5 F (36.9 C) (11/27 0726) Pulse Rate:  [93-120] 119 (11/27 0700) Resp:  [16-29] 27 (11/27 0700) BP: (85-135)/(47-119) 107/62 (11/27 0700) SpO2:  [92 %-100 %] 93 % (11/27 0700) FiO2 (%):  [50 %] 50 % (11/27 0700) Weight:  [125.3 kg] 125.3 kg (11/27 0500) Last BM Date: 11/25/20  Weight change: Filed Weights   11/27/20 0524 11/28/20 0324 11/29/20 0500  Weight: 129.1 kg 129.3 kg 125.3 kg    Intake/Output:   Intake/Output Summary (Last 24 hours) at 11/29/2020 0733 Last data filed at 11/29/2020 0700 Gross per 24 hour  Intake 1678.09 ml  Output 4098 ml  Net -2419.91 ml    CVP 10 personally checked.  General: NAD Neck: JVP 10 cm, no thyromegaly or thyroid nodule.  Lungs: Clear to auscultation bilaterally with normal respiratory effort. CV: Nondisplaced PMI.  Heart tachy, irregular S1/S2, no S3/S4, no murmur.  Trace ankle edema.   Abdomen: Soft, nontender, no hepatosplenomegaly, no distention.  Skin: Intact without lesions or rashes.  Neurologic: Alert and oriented x 3.  Psych: Normal affect. Extremities: No clubbing or cyanosis.  HEENT: Normal.   Telemetry: AF 100s-120s, runs NSVT overnight (personally reviewed)  Labs: Basic Metabolic Panel: Recent Labs  Lab 11/26/20 0450 11/27/20 0500 11/27/20 1638 11/27/20 1941 11/27/20 1953 11/28/20 0316 11/28/20 1600 11/29/20 0320  NA 135 135 135 137  --  131*  134* 135  K 3.8 4.4 4.2 4.3  --  4.2 4.3 4.2  CL 97* 95* 97*  --   --  97* 101 103  CO2 31 33* 32  --   --  29 29 29   GLUCOSE 210* 172* 127*  --   --  249* 195* 120*  BUN 68* 72* 62*  --   --  45* 32* 26*  CREATININE 2.46* 2.82* 2.43*  --   --  2.00* 1.71* 1.68*  CALCIUM 8.1* 8.3* 8.0*  --   --  7.8* 8.0* 8.0*  MG 2.4  --   --   --  2.6* 2.4  --  2.4  PHOS  --   --  4.7*  --   --  3.4 2.8 2.2*    Liver Function Tests: Recent Labs  Lab 11/23/20 0422 11/27/20 1638 11/28/20 0316 11/28/20 1600 11/29/20 0320  AST 16  --   --   --   --   ALT 14  --   --   --   --   ALKPHOS 70  --   --   --   --   BILITOT 1.0  --   --   --   --   PROT 6.4*  --   --   --   --   ALBUMIN 3.2* 2.8* 2.6* 2.7* 2.5*   No  results for input(s): LIPASE, AMYLASE in the last 168 hours. Recent Labs  Lab 11/25/20 0945 11/27/20 1145  AMMONIA 48* 49*    CBC: Recent Labs  Lab 11/25/20 0255 11/26/20 0450 11/27/20 0500 11/27/20 1941 11/28/20 0316 11/29/20 0320  WBC 4.2 4.8 5.0  --  4.9 4.4  HGB 8.6* 7.7* 8.2* 10.5* 7.8* 7.7*  HCT 31.5* 28.6* 30.6* 31.0* 29.4* 28.3*  MCV 93.2 92.6 93.0  --  92.2 92.2  PLT 91* 94* 98*  --  98* 113*    Cardiac Enzymes: No results for input(s): CKTOTAL, CKMB, CKMBINDEX, TROPONINI in the last 168 hours.  BNP: BNP (last 3 results) Recent Labs    10/17/20 0428 11/20/20 1128 11/22/20 0425  BNP 290.0* 306.1* 270.0*    ProBNP (last 3 results) No results for input(s): PROBNP in the last 8760 hours.    Other results:  Imaging: DG CHEST PORT 1 VIEW  Result Date: 11/27/2020 CLINICAL DATA:  Central line placement. EXAM: PORTABLE CHEST 1 VIEW COMPARISON:  Same day. FINDINGS: Interval placement right internal jugular catheter with distal tip in expected position of the SVC. No pneumothorax is noted. IMPRESSION: Interval placement of right internal jugular catheter with distal tip in expected position of the SVC. No pneumothorax is noted. Electronically Signed   By:  Marijo Conception M.D.   On: 11/27/2020 13:51   DG CHEST PORT 1 VIEW  Result Date: 11/27/2020 CLINICAL DATA:  Dyspnea. Cough and congestion and shortness of breath. EXAM: PORTABLE CHEST 1 VIEW COMPARISON:  11/25/2020 and older studies. FINDINGS: Cardiac silhouette is borderline enlarged. No mediastinal or hilar masses. There is hazy opacity throughout right hemithorax most prominent at the base, consistent with a combination of layering pleural fluid and airspace lung opacity. This has increased from the prior exam. There is also opacity at the left lung base, not well defined, consistent with atelectasis with a probable small effusion. Mid to upper left lung is clear. No pneumothorax. Right-sided PICC is stable. IMPRESSION: 1. Interval worsening of lung aeration on the right, although the apparent change may be due to differences in patient positioning and technique. Right-sided opacity is consistent with a combination of layering right pleural fluid and parenchymal lung opacity, the latter finding which may reflect asymmetric edema or pneumonia. 2. Mild stable opacity at the left lung base consistent with atelectasis with a suspected small effusion. Electronically Signed   By: Lajean Manes M.D.   On: 11/27/2020 10:24     Medications:     Scheduled Medications:  allopurinol  300 mg Oral Daily   atorvastatin  20 mg Oral Daily   Chlorhexidine Gluconate Cloth  6 each Topical Daily   darbepoetin (ARANESP) injection - NON-DIALYSIS  100 mcg Subcutaneous Q Sun-1800   insulin aspart  0-5 Units Subcutaneous QHS   insulin aspart  0-9 Units Subcutaneous TID WC   insulin glargine-yfgn  16 Units Subcutaneous QHS   lactulose  20 g Oral BID   mouth rinse  15 mL Mouth Rinse BID   midodrine  5 mg Oral TID WC   pantoprazole  40 mg Oral Daily   polyethylene glycol  17 g Oral Daily   sildenafil  20 mg Oral TID   sodium chloride flush  10-40 mL Intracatheter Q12H   tamsulosin  0.4 mg Oral Daily   Zinc Oxide    Topical BID    Infusions:   prismasol BGK 4/2.5 500 mL/hr at 11/29/20 0658    prismasol BGK 4/2.5 200 mL/hr  at 11/28/20 1010   sodium chloride 5 mL/hr at 11/29/20 0700   amiodarone 60 mg/hr (11/29/20 0725)   Followed by   amiodarone     ferric gluconate (FERRLECIT) IVPB Stopped (11/28/20 1406)   norepinephrine (LEVOPHED) Adult infusion Stopped (11/29/20 0219)   prismasol BGK 4/2.5 1,500 mL/hr at 11/29/20 0724    PRN Medications: sodium chloride, acetaminophen, alum & mag hydroxide-simeth, guaiFENesin-dextromethorphan, heparin, heparin, hydrocortisone cream, melatonin, sodium chloride flush   Assessment/Plan:    1. Cor pulmonale/RV failure: Suspect primarily due to OHS/OSA. Echo in 4/21 with EF 55-60%, RV severely dilated w/ severe RV dysfunction in the setting of severe pulmonary hypertension w/ PASP 76 mmHg. [place] 4/21 with elevated filling pressures, mixed pulmonary venous/pulmonary hypertension=> diuresed w/ IV Lasix in hospital and placed on po torsemide. Echo in 11/22 showed EF 55-60%, mod LVH, D-shaped septum, severe RV enlargement with severely decreased RV systolic function, IVC dilated. NYHA class IV. On milrinone 0.125 + NE 1 with ongoing CVVH.  CVP 10 today with co-ox 80%. - I will stop milrinone today.   - Continue NE as needed to maintain MAP, now on midodrine 5 mg tid.  - Continue CVVH, would aim for net negative 100-150 cc/hr UF again today, may be near euvolemic tomorrow.  2. Pulmonary HTN: PASP elevated at 76 mmHg by 4/21 echo estimate with RV systolic function severely reduced. Most likely poorly controlled OHS/OSA as cause (group 3 PH).  V/Q scan negative for chronic PE.  Mixed PVH/PAH on RHC.  Echo in 10/22 with severe RV failure and again this admission.   - Continue sildenafil 20 mg tid.  - Continue home oxygen and CPAP at night.    3. AKI on Stage IIIb CKD: Now on CVVH.  I think he would be a difficult long-term HD candidate (lives in SNF).  Only 270 cc UOP yesterday.   4. Atrial fibrillation: Chronic, unlikely to be able to successfully cardiovert given long-standing atrial fibrillation.  On warfarin with INR 2.4.   - Amiodarone gtt for rate control.    - Continue warfarin (has not had coverage for DOACs).  5. OSA/OHS: Currently on [place].  At home, CPAP + oxygen.   6. Diabetes: - Management per primary team.  7. MGUS: Previously followed by hem/onc but has not seen in several years.  Multiple myeloma panel 4/21 with presence of M-spike. 8. Cirrhosis: ?Due to NAFLD.  11/23 Ammonia 48. Paracentesis 2.4 L this admission.  - Continue lactulose.  9. Anemia: Hgb 7.7. No obvious source of bleeding.  Has had IV Fe.   - Transfuse < 7.  10. Encephalopathy: Suspect CO2 narcosis + possible component hepatic encephalopathy with elevated NH3.  Alert/oriented this morning.    11. Thrombocytopenia: In setting of acute inflammatory illness and possibly splenic sequestration with cirrhosis.  12. GOC: I will consulted palliative care for goals of care.  Lives in SNF, not ideal long-term HD candidate.  Suspect end stage RV failure. Currently he is electing to be full code.   CRITICAL CARE Performed by: Loralie Champagne  Total critical care time: 35 minutes  Critical care time was exclusive of separately billable procedures and treating other patients.  Critical care was necessary to treat or prevent imminent or life-threatening deterioration.  Critical care was time spent personally by me (independent of midlevel providers or residents) on the following activities: development of treatment plan with patient and/or surrogate as well as nursing, discussions with consultants, evaluation of patient's response to treatment, examination of  patient, obtaining history from patient or surrogate, ordering and performing treatments and interventions, ordering and review of laboratory studies, ordering and review of radiographic studies, pulse oximetry and re-evaluation of patient's  condition.  Loralie Champagne, MD  7:33 AM

## 2020-11-29 NOTE — Progress Notes (Signed)
[place] Progress Note Patient Name: William Flores DOB: 11-13-1950 MRN: [id number]   Date of Service  11/29/2020  HPI/Events of Note  Patient has had 3 runs of 15 beat NSVT and now in AFIB with RVR. Ventricular rate = 123. K+ = 4.2 and Mg++ = 2.4.  eICU Interventions  Plan: Amiodarone IV load and infusion.      Intervention Category Major Interventions: Arrhythmia - evaluation and management  Brynlei Klausner [place] 11/29/2020, 7:01 AM

## 2020-11-29 NOTE — Progress Notes (Signed)
[place] Progress Note Patient Name: William Flores DOB: 11/28/1950 MRN: [id number]   Date of Service  11/29/2020  HPI/Events of Note  Melatonin not effective. Patient requests another sleep aid.  eICU Interventions  Plan: Ambien 5 mg PO X 1.      Intervention Category Major Interventions: Other:  Lysle Dingwall 11/29/2020, 12:36 AM

## 2020-11-29 NOTE — Progress Notes (Signed)
   Palliative Medicine Inpatient Follow Up Note   HPI:70 year old male with PMH of a fib on Warfarin, Acute on CKD stage IIIb, T2DM, Htn, hyerlipidenia, GERD, Chronic HFpEF, OSA, hepatic cirrhosis secondary to [facility], inpatient for treatment of cor pulmonale,/RV failure on milrinone, CRRT since 11/25, pulmonary hypertension, acute respiratory failure, anemia and thrombocytopenia.   Today's Discussion symptom management:  Mr. Boudoin is feeling pretty tired but states some better with fluid off. Nursing reports he has been more clear today.  His daughter called yesterday did visit him last night. Medications reviewed.  Discussed the importance of continued conversation with family and their  medical providers regarding overall plan of care and treatment options, ensuring decisions are within the context of the patients values and GOCs.  Questions and concerns addressed   Objective Assessment: Vital Signs Vitals:   11/29/20 1730 11/29/20 1800  BP: (!) 91/40 95/61  Pulse: 98 93  Resp: (!) 24 20  Temp:    SpO2: 96% 96%    Intake/Output Summary (Last 24 hours) at 11/29/2020 1812 Last data filed at 11/29/2020 1800 Gross per 24 hour  Intake 1681.28 ml  Output 4143 ml  Net -2461.72 ml   Last Weight  Most recent update: 11/29/2020  5:53 AM    Weight  125.3 kg (276 lb 3.8 oz)              SUMMARY OF RECOMMENDATIONS   Code Status/Advance Care Planning: FULL CODE- MOST in  Media and sent to medical records.   Symptom Management:  Pruritis- hydrocortisone cream 1% Pain- acetaminophen prn   Palliative Prophylaxis:  Indigestion- aluminum and magnesium hydroxide-simethicone suspension GI prophylaxis- polyethylene glycol prn GERD- Pantoprazole prn Difficulty sleeping- melatonin prn, BiPAP at night and prn during the day   Additional Recommendations (Limitations, Scope, Preferences): Full scope of care per patient wishes   Psycho-social/Spiritual:  Desire for further  Chaplaincy support: No Additional Recommendations:   Full scope of treatment per patient's wishes.  Complete advanced directive Monday when notary is available. Follow for symptom management and continued [place] conversations if condition changes.   Prognosis: Seriously ill patient with heart failure that may be end stage and AKI on stage IIIb CKD with multiple comorbidites. He may not be a candidate for hemodialysis long term.    Discharge Planning: Mr. Sculley would like a different SNF for rehab. He does not feel he is getting the care he needs at [facility].   Time In:16:50 Time Out: 17:05 Time Spent: 15 minutes Greater than 50% of the time was spent in counseling and coordination of care ______________________________________________________________________________________ Lindell Spar, NP [place] Team Team Cell Phone: [phone] Please utilize secure chat with additional questions, if there is no response within 30 minutes please call the above phone number  Palliative Medicine Team providers are available by phone from 7am to 7pm daily and can be reached through the team cell phone.  Should this patient require assistance outside of these hours, please call the patient's attending physician.

## 2020-11-29 NOTE — Progress Notes (Signed)
Patient ID: William Flores, male   DOB: [birth year], 70 y.o.   MRN: [id number] [facility] Progress Note   Assessment/ Plan:   1.  Acute kidney injury on chronic kidney disease stage IIIb: Likely hemodynamically mediated in the setting of acute exacerbation of congestive heart failure.  Started CRRT after refractory to diuretics; ultrafiltration goal decreased to 150 cc/h after developing hypotension and I suspect we will be able to increase this up to 200 cc/h once he is adequately rate controlled. 2.  Acute exacerbation of diastolic heart failure: With severe pulmonary hypertension and significant third spacing/volume overload.  Started on CRRT for volume unloading after refractory to diuretics. 3.  Encephalopathy: Likely multifactorial with hypercapnic respiratory failure/CO2 narcosis and azotemia contributing to possible hepatic encephalopathy with slightly elevated ammonia levels.  Mental status significantly better. 4.  Anemia of chronic illness: He has severe iron deficiency with an iron saturation of 4% and ferritin of 31.  Continue intravenous iron and ESA 5.  Chronic atrial fibrillation: Developed atrial fibrillation with rapid ventricular response overnight and corresponding blood pressure fluctuations, getting amiodarone bolus followed by infusion at this time.  On anticoagulation with Coumadin 6.  Thrombocytopenia: Likely from splenic sequestration in the setting of cirrhosis, no evidence of bleeding diathesis.  Subjective:   Complains of discomfort from Foley catheter and Unna boots.  Getting amiodarone bolus at this time after overnight developing atrial fibrillation with RVR.  Objective:   BP (!) 85/71   Pulse (!) 114   Temp 98.2 F (36.8 C)   Resp 19   Ht 5' 7"  (1.702 m)   Wt 125.3 kg   SpO2 94%   BMI 43.26 kg/m   Intake/Output Summary (Last 24 hours) at 11/29/2020 1025 Last data filed at 11/29/2020 0600 Gross per 24 hour  Intake 1668.32 ml  Output 3943 ml   Net -2274.68 ml   Weight change: -4 kg  Physical Exam: Gen: Appears comfortable resting in bed, sitting up and eating breakfast CVS: Pulse regular rhythm, normal rate, S1 and S2 normal Resp: Distant breath sounds bilaterally, no distinct rales or rhonchi Abd: Soft, obese/moderately distended, nontender, bowel sounds normal Ext: Unna boots over legs, trace to 1+ edema over feet/thighs  Imaging: DG CHEST PORT 1 VIEW  Result Date: 11/27/2020 CLINICAL DATA:  Central line placement. EXAM: PORTABLE CHEST 1 VIEW COMPARISON:  Same day. FINDINGS: Interval placement right internal jugular catheter with distal tip in expected position of the SVC. No pneumothorax is noted. IMPRESSION: Interval placement of right internal jugular catheter with distal tip in expected position of the SVC. No pneumothorax is noted. Electronically Signed   By: Marijo Conception M.D.   On: 11/27/2020 13:51   DG CHEST PORT 1 VIEW  Result Date: 11/27/2020 CLINICAL DATA:  Dyspnea. Cough and congestion and shortness of breath. EXAM: PORTABLE CHEST 1 VIEW COMPARISON:  11/25/2020 and older studies. FINDINGS: Cardiac silhouette is borderline enlarged. No mediastinal or hilar masses. There is hazy opacity throughout right hemithorax most prominent at the base, consistent with a combination of layering pleural fluid and airspace lung opacity. This has increased from the prior exam. There is also opacity at the left lung base, not well defined, consistent with atelectasis with a probable small effusion. Mid to upper left lung is clear. No pneumothorax. Right-sided PICC is stable. IMPRESSION: 1. Interval worsening of lung aeration on the right, although the apparent change may be due to differences in patient positioning and technique. Right-sided opacity is consistent with  a combination of layering right pleural fluid and parenchymal lung opacity, the latter finding which may reflect asymmetric edema or pneumonia. 2. Mild stable opacity at  the left lung base consistent with atelectasis with a suspected small effusion. Electronically Signed   By: Lajean Manes M.D.   On: 11/27/2020 10:24    Labs: BMET Recent Labs  Lab 11/25/20 0255 11/26/20 0450 11/27/20 0500 11/27/20 1638 11/27/20 1941 11/28/20 0316 11/28/20 1600 11/29/20 0320  NA 136 135 135 135 137 131* 134* 135  K 4.5 3.8 4.4 4.2 4.3 4.2 4.3 4.2  CL 97* 97* 95* 97*  --  97* 101 103  CO2 30 31 33* 32  --  29 29 29   GLUCOSE 171* 210* 172* 127*  --  249* 195* 120*  BUN 71* 68* 72* 62*  --  45* 32* 26*  CREATININE 2.48* 2.46* 2.82* 2.43*  --  2.00* 1.71* 1.68*  CALCIUM 8.6* 8.1* 8.3* 8.0*  --  7.8* 8.0* 8.0*  PHOS  --   --   --  4.7*  --  3.4 2.8 2.2*   CBC Recent Labs  Lab 11/26/20 0450 11/27/20 0500 11/27/20 1941 11/28/20 0316 11/29/20 0320  WBC 4.8 5.0  --  4.9 4.4  HGB 7.7* 8.2* 10.5* 7.8* 7.7*  HCT 28.6* 30.6* 31.0* 29.4* 28.3*  MCV 92.6 93.0  --  92.2 92.2  PLT 94* 98*  --  98* 113*    Medications:     allopurinol  300 mg Oral Daily   amiodarone  150 mg Intravenous Once   atorvastatin  20 mg Oral Daily   Chlorhexidine Gluconate Cloth  6 each Topical Daily   darbepoetin (ARANESP) injection - NON-DIALYSIS  100 mcg Subcutaneous Q Sun-1800   insulin aspart  0-5 Units Subcutaneous QHS   insulin aspart  0-9 Units Subcutaneous TID WC   insulin glargine-yfgn  16 Units Subcutaneous QHS   lactulose  20 g Oral BID   mouth rinse  15 mL Mouth Rinse BID   midodrine  5 mg Oral TID WC   pantoprazole  40 mg Oral Daily   polyethylene glycol  17 g Oral Daily   sildenafil  20 mg Oral TID   sodium chloride flush  10-40 mL Intracatheter Q12H   tamsulosin  0.4 mg Oral Daily   Zinc Oxide   Topical BID   Elmarie Shiley, MD 11/29/2020, 7:05 AM

## 2020-11-30 ENCOUNTER — Telehealth: Payer: Self-pay | Admitting: *Deleted

## 2020-11-30 DIAGNOSIS — Z515 Encounter for palliative care: Secondary | ICD-10-CM

## 2020-11-30 DIAGNOSIS — I272 Pulmonary hypertension, unspecified: Secondary | ICD-10-CM | POA: Diagnosis not present

## 2020-11-30 DIAGNOSIS — J9621 Acute and chronic respiratory failure with hypoxia: Secondary | ICD-10-CM | POA: Diagnosis not present

## 2020-11-30 DIAGNOSIS — N179 Acute kidney failure, unspecified: Secondary | ICD-10-CM

## 2020-11-30 DIAGNOSIS — I50810 Right heart failure, unspecified: Secondary | ICD-10-CM | POA: Diagnosis not present

## 2020-11-30 DIAGNOSIS — I50811 Acute right heart failure: Secondary | ICD-10-CM

## 2020-11-30 DIAGNOSIS — I5033 Acute on chronic diastolic (congestive) heart failure: Secondary | ICD-10-CM | POA: Diagnosis not present

## 2020-11-30 DIAGNOSIS — K7581 Nonalcoholic steatohepatitis (NASH): Secondary | ICD-10-CM | POA: Diagnosis not present

## 2020-11-30 DIAGNOSIS — I4891 Unspecified atrial fibrillation: Secondary | ICD-10-CM | POA: Diagnosis not present

## 2020-11-30 DIAGNOSIS — I2729 Other secondary pulmonary hypertension: Secondary | ICD-10-CM

## 2020-11-30 DIAGNOSIS — I5031 Acute diastolic (congestive) heart failure: Secondary | ICD-10-CM | POA: Diagnosis not present

## 2020-11-30 DIAGNOSIS — Z7901 Long term (current) use of anticoagulants: Secondary | ICD-10-CM | POA: Diagnosis not present

## 2020-11-30 DIAGNOSIS — Z7189 Other specified counseling: Secondary | ICD-10-CM | POA: Diagnosis not present

## 2020-11-30 DIAGNOSIS — I4821 Permanent atrial fibrillation: Secondary | ICD-10-CM | POA: Diagnosis not present

## 2020-11-30 LAB — HEPARIN LEVEL (UNFRACTIONATED): Heparin Unfractionated: <0.1 IU/mL — ABNORMAL LOW (ref 0.30–0.70)

## 2020-11-30 LAB — GLUCOSE, CAPILLARY
Glucose-Capillary: 121 mg/dL — ABNORMAL HIGH (ref 70–99)
Glucose-Capillary: 145 mg/dL — ABNORMAL HIGH (ref 70–99)
Glucose-Capillary: 140 mg/dL — ABNORMAL HIGH (ref 70–99)
Glucose-Capillary: 133 mg/dL — ABNORMAL HIGH (ref 70–99)

## 2020-11-30 LAB — RENAL FUNCTION PANEL
Albumin: 2.7 g/dL — ABNORMAL LOW (ref 3.5–5.0)
Albumin: 2.9 g/dL — ABNORMAL LOW (ref 3.5–5.0)
Anion gap: 6 (ref 5–15)
Anion gap: 6 (ref 5–15)
BUN: 20 mg/dL (ref 8–23)
BUN: 17 mg/dL (ref 8–23)
CO2: 27 mmol/L (ref 22–32)
CO2: 27 mmol/L (ref 22–32)
Calcium: 8.1 mg/dL — ABNORMAL LOW (ref 8.9–10.3)
Calcium: 8.1 mg/dL — ABNORMAL LOW (ref 8.9–10.3)
Chloride: 102 mmol/L (ref 98–111)
Chloride: 100 mmol/L (ref 98–111)
Creatinine, Ser: 1.81 mg/dL — ABNORMAL HIGH (ref 0.61–1.24)
Creatinine, Ser: 1.64 mg/dL — ABNORMAL HIGH (ref 0.61–1.24)
GFR, Estimated: 40 mL/min — ABNORMAL LOW (ref >60)
GFR, Estimated: 45 mL/min — ABNORMAL LOW (ref >60)
Glucose, Bld: 117 mg/dL — ABNORMAL HIGH (ref 70–99)
Glucose, Bld: 193 mg/dL — ABNORMAL HIGH (ref 70–99)
Phosphorus: 3.2 mg/dL (ref 2.5–4.6)
Phosphorus: 2.6 mg/dL (ref 2.5–4.6)
Potassium: 4.5 mmol/L (ref 3.5–5.1)
Potassium: 4.2 mmol/L (ref 3.5–5.1)
Sodium: 135 mmol/L (ref 135–145)
Sodium: 133 mmol/L — ABNORMAL LOW (ref 135–145)

## 2020-11-30 LAB — MAGNESIUM: Magnesium: 2.4 mg/dL (ref 1.7–2.4)

## 2020-11-30 LAB — PROTIME-INR
INR: 1.7 — ABNORMAL HIGH (ref 0.8–1.2)
Prothrombin Time: 19.6 seconds — ABNORMAL HIGH (ref 11.4–15.2)

## 2020-11-30 LAB — CBC
HCT: 29.3 % — ABNORMAL LOW (ref 39.0–52.0)
Hemoglobin: 7.8 g/dL — ABNORMAL LOW (ref 13.0–17.0)
MCH: 24.8 pg — ABNORMAL LOW (ref 26.0–34.0)
MCHC: 26.6 g/dL — ABNORMAL LOW (ref 30.0–36.0)
MCV: 93.3 fL (ref 80.0–100.0)
Platelets: 111 10*3/uL — ABNORMAL LOW (ref 150–400)
RBC: 3.14 MIL/uL — ABNORMAL LOW (ref 4.22–5.81)
RDW: 19.6 % — ABNORMAL HIGH (ref 11.5–15.5)
WBC: 3.9 10*3/uL — ABNORMAL LOW (ref 4.0–10.5)
nRBC: 1.3 % — ABNORMAL HIGH (ref 0.0–0.2)

## 2020-11-30 LAB — COOXEMETRY PANEL
Carboxyhemoglobin: 1.7 % — ABNORMAL HIGH (ref 0.5–1.5)
Methemoglobin: 0.8 % (ref 0.0–1.5)
O2 Saturation: 71.4 %
Total hemoglobin: 8.1 g/dL — ABNORMAL LOW (ref 12.0–16.0)

## 2020-11-30 MED ORDER — HEPARIN (PORCINE) 25000 UT/250ML-% IV SOLN
2100.0000 [IU]/h | INTRAVENOUS | Status: DC
  Administered 2020-11-30: 09:00:00 1250 [IU]/h via INTRAVENOUS
  Administered 2020-12-01: 1500 [IU]/h via INTRAVENOUS
  Administered 2020-12-01 – 2020-12-02 (×2): 1900 [IU]/h via INTRAVENOUS
  Administered 2020-12-02: 1800 [IU]/h via INTRAVENOUS
  Administered 2020-12-03 – 2020-12-04 (×3): 1850 [IU]/h via INTRAVENOUS
  Administered 2020-12-05: 19:00:00 1950 [IU]/h via INTRAVENOUS
  Administered 2020-12-05: 1850 [IU]/h via INTRAVENOUS
  Administered 2020-12-06 – 2020-12-10 (×6): 1950 [IU]/h via INTRAVENOUS
  Filled 2020-11-30 (×18): qty 250

## 2020-11-30 MED ORDER — B COMPLEX-C PO TABS
1.0000 | ORAL_TABLET | Freq: Every day | ORAL | Status: DC
  Administered 2020-11-30 – 2020-12-11 (×11): 1 via ORAL
  Filled 2020-11-30 (×11): qty 1

## 2020-11-30 MED ORDER — PROSOURCE PLUS PO LIQD
30.0000 mL | Freq: Three times a day (TID) | ORAL | Status: DC
  Administered 2020-11-30 – 2020-12-09 (×23): 30 mL via ORAL
  Filled 2020-11-30 (×27): qty 30

## 2020-11-30 MED ORDER — LIP MEDEX EX OINT
TOPICAL_OINTMENT | CUTANEOUS | Status: DC | PRN
  Filled 2020-11-30: qty 7

## 2020-11-30 NOTE — Progress Notes (Signed)
Daily Progress Note   Patient Name: William Flores       Date: 11/30/2020 DOB: 1950-09-24  Age: 70 y.o. MRN#: [id number] Attending Physician: Julian Hy, DO Primary Care Physician: [name], [name], MD Admit Date: 11/22/2020  Reason for Consultation/Follow-up: Establishing goals of care  Patient Profile/HPI:  70 year old male with PMH of a fib on Warfarin, Acute on CKD stage IIIb, T2DM, Htn, hyerlipidenia, GERD, Chronic HFpEF, OSA, hepatic cirrhosis secondary to [facility], inpatient for treatment of cor pulmonale,/RV failure on milrinone, CRRT since 11/25, pulmonary hypertension, acute respiratory failure, anemia and thrombocytopenia.  Subjective: Patient sleeping. He arouses a bit but is not conversational. He completed HCPOA paperwork today and appointed his step daughter William Flores as his first HCPOA and son, William Flores as second.  I spoke with Derek Mound, informed him of HCPOA completion and designations. William Flores asked if he could override who his father appointed as Economist. I let him know that it could not be overridden without patient's approval or new directive completion. William Flores also asked about financial power of attorney- I let him know that the hospital is only able to assist with health care directives.         Vital Signs: BP 97/69   Pulse 80   Temp (!) 97.3 F (36.3 C) (Oral)   Resp (!) 21   Ht 5' 7"  (1.702 m)   Wt 122.6 kg   SpO2 97%   BMI 42.33 kg/m  SpO2: SpO2: 97 % O2 Device: O2 Device: High Flow Nasal Cannula O2 Flow Rate: O2 Flow Rate (L/min): 4 L/min  Intake/output summary:  Intake/Output Summary (Last 24 hours) at 11/30/2020 1504 Last data filed at 11/30/2020 1400 Gross per 24 hour  Intake 1926.73 ml  Output 5024 ml  Net -3097.27 ml   LBM: Last BM Date:  11/25/20 Baseline Weight: Weight: 130.2 kg Most recent weight: Weight: 122.6 kg       Palliative Assessment/Data: PPS: 40%        Palliative Care Assessment & Plan    Assessment/Recommendations/Plan  Continue full scope, full code PMT will monitor for progress/decline and readress GOC prn   Code Status: Full code  Prognosis:  Unable to determine  Discharge Planning: To Be Determined  Care plan was discussed with patient's son and care team.   Thank you  for allowing the Palliative Medicine Team to assist in the care of this patient.  Total time:  40 minutes Prolonged billing: No     Greater than 50%  of this time was spent counseling and coordinating care related to the above assessment and plan.  Mariana Kaufman, AGNP-C Palliative Medicine   Please contact Palliative Medicine Team phone at [phone] for questions and concerns.

## 2020-11-30 NOTE — Progress Notes (Signed)
[place] II for warfarin > heparin while on CRRT Indication: atrial fibrillation  Allergies  Allergen Reactions   Codeine     Headache     Patient Measurements: Height: 5' 7"  (170.2 cm) Weight: 122.6 kg (270 lb 4.5 oz) IBW/kg (Calculated) : 66.1  Vital Signs: Temp: 97.3 F (36.3 C) (11/28 0630) Temp Source: Oral (11/28 0630) BP: 96/71 (11/28 0800) Pulse Rate: 89 (11/28 0800)  Labs: Recent Labs    11/28/20 0316 11/28/20 1600 11/29/20 0320 11/29/20 1600 11/29/20 2151 11/30/20 0320  HGB 7.8*  --  7.7*  --   --  7.8*  HCT 29.4*  --  28.3*  --   --  29.3*  PLT 98*  --  113*  --   --  111*  LABPROT 29.9*  --  26.4*  --   --  19.6*  INR 2.9*  --  2.4*  --   --  1.7*  CREATININE 2.00*   < > 1.68* 1.69* 1.90* 1.81*   < > = values in this interval not displayed.     Estimated Creatinine Clearance: 49 mL/min (A) (by C-G formula based on SCr of 1.81 mg/dL (H)).   Medical History: Past Medical History:  Diagnosis Date   CAD (coronary artery disease)    LHC 2/08:  mRCA 50%, o/w no sig CAD, EF 25%   Cardiomyopathy ([place])    EF 35-40% in 2/16 in setting of AF with RVR >> echo 5/16 with improved LVF with EF 65-70%   Chronic atrial fibrillation (HCC)    Chronic diastolic heart failure (HCC)    HFPF - EF ~65-70% (OFTEN EXACERBATED BY AFIB)   CKD (chronic kidney disease)    hx of worsening renal failure and hyperK+ in setting of acute diast CHF >> required CVVHD in 4/16 and dialysis x 1 in 5/16   CKD stage 3 due to type 2 diabetes mellitus ([place]) 09/17/2015   Colon cancer ([place])    Colon polyps 09/21/2012   Tubular adenoma   Diabetes ([place])    Hyperlipidemia    Hypertension    Obesity hypoventilation syndrome ([place])    Renal insufficiency    Sleep apnea     Medications:    prismasol BGK 4/2.5 500 mL/hr at 11/30/20 0721    prismasol BGK 4/2.5 200 mL/hr at 11/29/20 1722   sodium chloride 5 mL/hr at 11/30/20 0800   amiodarone 30 mg/hr  (11/30/20 0800)   ferric gluconate (FERRLECIT) IVPB Stopped (11/29/20 1228)   heparin     norepinephrine (LEVOPHED) Adult infusion Stopped (11/29/20 0746)   prismasol BGK 4/2.5 1,500 mL/hr at 11/30/20 0388     Assessment: Pharmacy consulted to dose warfarin in patient with atrial fibrillation.  INR on admission is 2.7.  Patient is an unreliable historian.  Home dose listed as 7.5 mg daily per med rec.    Patient recently admitted here in October for GI bleed and per GI recommended INR 2-2.5.  INR down to 1.7 today after holding since 11/23.  No overt bleeding or complications noted currently.  Goal of Therapy:  INR 2-2.5 Monitor platelets by anticoagulation protocol: Yes   Plan:  Start IV heparin today at 1250 units/hr. Check heparin level in 8 hrs. Daily heparin level and CBC.  [place] Crane, Roylene Reason, BCCP Clinical Pharmacist  11/30/2020 8:56 AM   [facility] pharmacy phone numbers are listed on [email or web address]

## 2020-11-30 NOTE — Evaluation (Signed)
Clinical/Bedside Swallow Evaluation Patient Details  Name: William Flores MRN: [id number] Date of Birth: 02-04-1950  Today's Date: 11/30/2020 Time: SLP Start Time (ACUTE ONLY): 9449 SLP Stop Time (ACUTE ONLY): 1203 SLP Time Calculation (min) (ACUTE ONLY): 14 min  Past Medical History:  Past Medical History:  Diagnosis Date   CAD (coronary artery disease)    LHC 2/08:  mRCA 50%, o/w no sig CAD, EF 25%   Cardiomyopathy ([place])    EF 35-40% in 2/16 in setting of AF with RVR >> echo 5/16 with improved LVF with EF 65-70%   Chronic atrial fibrillation (HCC)    Chronic diastolic heart failure (HCC)    HFPF - EF ~65-70% (OFTEN EXACERBATED BY AFIB)   CKD (chronic kidney disease)    hx of worsening renal failure and hyperK+ in setting of acute diast CHF >> required CVVHD in 4/16 and dialysis x 1 in 5/16   CKD stage 3 due to type 2 diabetes mellitus ([place]) 09/17/2015   Colon cancer ([place])    Colon polyps 09/21/2012   Tubular adenoma   Diabetes ([place])    Hyperlipidemia    Hypertension    Obesity hypoventilation syndrome ([place])    Renal insufficiency    Sleep apnea    Past Surgical History:  Past Surgical History:  Procedure Laterality Date   CIRCUMCISION     COLON RESECTION     COLONOSCOPY N/A 09/21/2012   Procedure: COLONOSCOPY;  Surgeon: Inda Castle, MD;  Location: [facility] ENDOSCOPY;  Service: Endoscopy;  Laterality: N/A;   COLONOSCOPY N/A 12/28/2018   Procedure: COLONOSCOPY;  Surgeon: Rogene Houston, MD;  Location: [facility] ENDO SU[facility];  Service: Endoscopy;  Laterality: N/A;   COLONOSCOPY WITH PROPOFOL N/A 10/17/2020   Procedure: COLONOSCOPY WITH PROPOFOL;  Surgeon: Harvel Quale, MD;  Location: [facility] ENDO [facility];  Service: Gastroenterology;  Laterality: N/A;   ENTEROSCOPY N/A 06/24/2020   Procedure: ENTEROSCOPY;  Surgeon: Gatha Mayer, MD;  Location[facility] ENDOSCOPY;  Service: Endoscopy;  Laterality: N/A;   ESOPHAGOGASTRODUODENOSCOPY N/A 12/28/2018   Procedure:  ESOPHAGOGASTRODUODENOSCOPY (EGD);  Surgeon: Rogene Houston, MD;  Location: [facility];  Service: Endoscopy;  Laterality: N/A;   POLYPECTOMY  10/17/2020   Procedure: POLYPECTOMY;  Surgeon: Harvel Quale, MD;  Location: [facility];  Service: Gastroenterology;;  rectal, transverse, descending, ascending, sigmoid   RIGHT HEART CATH N/A 04/11/2019   Procedure: RIGHT HEART CATH;  Surgeon: Larey Dresser, MD;  Location: [place] CV LAB;  Service: Cardiovascular;  Laterality: N/A;   US ECHOCARDIOGRAPHY  03/04/2010   abnormal study   HPI:  Pt is a 70 yo male admitted from SNF with cor pulmonale/RV failure currently on CRRT. He had a previous swallow eval in 2016 after requiring intubation, but dysphagia resolved during that admission. PMH includes: acute on chronic right heart failure cor pulmonale severe pulmonary hypertension, metabolic encephalopathy, acute on chronic anemia chronic with acute respiratory failure, cirrhosis secondary to Karlene Lineman, chronic atrial fibrillation acute kidney injury, thrombocytopenia diabetes mellitus obstructive sleep apnea    Assessment / Plan / Recommendation  Clinical Impression  Pt's oropharyngeal swallowing appears to be [facility] across consistencies tested, including mixed consistency as he took a pill from RN whole with thin liquid wash. Nursing reports no overt concerns as he consumed his whole meal tray earlier today and the pt denies any subjective symptoms. Recommend continuing on regular solids and thin liquids, using general aspiration precautions given acute deconditioning and potential for limited positioning while in bed.  SLP to s/o acutely but please reorder with any new concerns. SLP Visit Diagnosis: Dysphagia, unspecified (R13.10)    Aspiration Risk  Mild aspiration risk    Diet Recommendation Regular;Thin liquid   Liquid Administration via: Cup;Straw Medication Administration: Whole meds with liquid Supervision: Patient able to self  feed Compensations: Slow rate Postural Changes: Seated upright at 90 degrees    Other  Recommendations Oral Care Recommendations: Oral care BID    Recommendations for follow up therapy are one component of a multi-disciplinary discharge planning process, led by the attending physician.  Recommendations may be updated based on patient status, additional functional criteria and insurance authorization.  Follow up Recommendations No SLP follow up      Assistance Recommended at Discharge PRN  Functional Status Assessment    Frequency and Duration            Prognosis Prognosis for Safe Diet Advancement: Good      Swallow Study   General HPI: Pt is a 70 yo male admitted from SNF with cor pulmonale/RV failure currently on CRRT. He had a previous swallow eval in 2016 after requiring intubation, but dysphagia resolved during that admission. PMH includes: acute on chronic right heart failure cor pulmonale severe pulmonary hypertension, metabolic encephalopathy, acute on chronic anemia chronic with acute respiratory failure, cirrhosis secondary to Karlene Lineman, chronic atrial fibrillation acute kidney injury, thrombocytopenia diabetes mellitus obstructive sleep apnea Type of Study: Bedside Swallow Evaluation Previous Swallow Assessment: BSE in 2016 after intubation, dysphagia resolved during that admission Diet Prior to this Study: Regular;Thin liquids Temperature Spikes Noted: No Respiratory Status: Nasal cannula History of Recent Intubation: No Behavior/Cognition: Alert;Cooperative;Pleasant mood Oral Cavity Assessment: Within Functional Limits Oral Care Completed by SLP: No Oral Cavity - Dentition: Adequate natural dentition Vision: Functional for self-feeding Self-Feeding Abilities: Able to feed self Patient Positioning: Upright in bed Baseline Vocal Quality: Normal Volitional Swallow: Able to elicit    Oral/Motor/Sensory Function Overall Oral Motor/Sensory Function: Within functional  limits   Ice Chips Ice chips: Not tested   Thin Liquid Thin Liquid: Within functional limits Presentation: Self Fed;Straw    Nectar Thick Nectar Thick Liquid: Not tested   Honey Thick Honey Thick Liquid: Not tested   Puree Puree: Not tested   Solid     Solid: Within functional limits Presentation: Self Fed      Osie Bond., M.A. [place] [facility] Pager [phone] Office [phone]  11/30/2020,12:25 PM

## 2020-11-30 NOTE — Progress Notes (Signed)
This chaplain responded to PMT referral for creating/updating the Pt. Advance Directive.  The Pt. participated in [place] education with the chaplain and the Pt. RN-Jessica at the bedside. The Pt. decided on the Pt. step daughter-[name] as his healthcare agent because of the strength in their existing relationship.   The chaplain emphasized the role of HCPOA as a decision maker if the Pt. can't make decisions for himself.  The Pt. reiterated his concern about dying as the reason for completing HCPOA. The chaplain discovered short, to the point communication works best for the Pt. today.  The chaplain is attempting to schedule a notary and witnesses for today.  Chaplain Sallyanne Kuster [phone]

## 2020-11-30 NOTE — Evaluation (Signed)
Occupational Therapy Evaluation Patient Details Name: William Flores MRN: [id number] DOB: 27-May-1950 Today's Date: 11/30/2020   History of Present Illness Pt is a 70 y.o. male admitted from [facility] on 24/23/53 with acute exacerbation of CHF, severe pulmonary hypertension. S/p R paracentesis on 11/22. PMH includes a-fib, HTN, DM, CKD stage III, CAD, HLD, sleep apnea, cardiomyopathy, cirrhosis, acute on chronic respiratory failure (2L O2 baseline), obesity.   Clinical Impression   PTA, pt admitted from [facility] rehab with diagnoses above and deficits in strength, standing balance and overall endurance. Typically, pt reports Modified Independence with ADLs/mobility using Rollator. Today, pt requires Min A x 2 for basic transfers via handheld assist (RN also in to assist with CRRT line mgmt). Pt requires Min A for UB ADLs and Mod A for LB ADLs due to deficits but anticipate good progress towards goals. Recommend SNF rehab to maximize independence prior to return home.   BP post-activity: 100/60  SpO2 WFL on 4 L O2, HR WFL     Recommendations for follow up therapy are one component of a multi-disciplinary discharge planning process, led by the attending physician.  Recommendations may be updated based on patient status, additional functional criteria and insurance authorization.   Follow Up Recommendations  Skilled nursing-short term rehab (<3 hours/day)    Assistance Recommended at Discharge Frequent or constant Supervision/Assistance  Functional Status Assessment  Patient has had a recent decline in their functional status and demonstrates the ability to make significant improvements in function in a reasonable and predictable amount of time.  Equipment Recommendations  Other (comment) (defer to next venue)    Recommendations for Other Services       Precautions / Restrictions Precautions Precautions: Fall;Other (comment) Precaution Comments: Watch HR, SpO2 (wears 2L O2 baseline per  chart); CRRT Restrictions Weight Bearing Restrictions: No      Mobility Bed Mobility Overal bed mobility: Needs Assistance Bed Mobility: Supine to Sit     Supine to sit: Min assist;HOB elevated     General bed mobility comments: Min A to lift trunk to EOB, able to advance LEs with tactile cues    Transfers Overall transfer level: Needs assistance Equipment used: 2 person hand held assist Transfers: Sit to/from Stand;Bed to chair/wheelchair/BSC Sit to Stand: Min assist;+2 safety/equipment     Step pivot transfers: Min assist;+2 safety/equipment     General transfer comment: Min A x 2 for safety (with RN assisting with CRRT lines as well) for sit to stand and taking steps to L side to recliner      Balance Overall balance assessment: Needs assistance Sitting-balance support: No upper extremity supported;Feet supported Sitting balance-Leahy Scale: Fair     Standing balance support: No upper extremity supported;During functional activity;Single extremity supported Standing balance-Leahy Scale: Fair Standing balance comment: fair static standing, benefits from external support with steadying                           ADL either performed or assessed with clinical judgement   ADL Overall ADL's : Needs assistance/impaired     Grooming: Set up;Sitting   Upper Body Bathing: Minimal assistance;Sitting   Lower Body Bathing: Moderate assistance;Sit to/from stand   Upper Body Dressing : Minimal assistance;Sitting   Lower Body Dressing: Moderate assistance;Sit to/from stand Lower Body Dressing Details (indicate cue type and reason): assist to don socks, limited by lines and high bed surface with difficulty reaching feet Toilet Transfer: Minimal assistance;+2 for safety/equipment;Stand-pivot  Toilet Transfer Details (indicate cue type and reason): simulated to recliner Toileting- Clothing Manipulation and Hygiene: Moderate assistance;Sit to/from stand          General ADL Comments: Pt limited by decreased OOB activity, initiation of CRRT impacting endurance and overall strength     Vision Ability to See in Adequate Light: 0 Adequate Patient Visual Report: No change from baseline Vision Assessment?: No apparent visual deficits     Perception     Praxis      Pertinent Vitals/Pain Pain Assessment: No/denies pain Faces Pain Scale: No hurt Pain Intervention(s): Monitored during session     Hand Dominance Right   Extremity/Trunk Assessment Upper Extremity Assessment Upper Extremity Assessment: Generalized weakness   Lower Extremity Assessment Lower Extremity Assessment: Defer to PT evaluation   Cervical / Trunk Assessment Cervical / Trunk Assessment: Normal   Communication Communication Communication: No difficulties   Cognition Arousal/Alertness: Awake/alert Behavior During Therapy: WFL for tasks assessed/performed Overall Cognitive Status: No family/caregiver present to determine baseline cognitive functioning Area of Impairment: Following commands;Safety/judgement;Awareness;Problem solving                       Following Commands: Follows one step commands with increased time Safety/Judgement: Decreased awareness of safety Awareness: Emergent Problem Solving: Requires verbal cues General Comments: Pt pleasant, follows directions consistently and benefits from mutimodal cues. Did not formally assess cognition but functional for basic tasks     General Comments  SpO2 WFL on supplemental O2, BP 100/60 after transfer, HR stable. RN In to assist with CRRT line mgmt for safety    Exercises     Shoulder Instructions      Home Living Family/patient expects to be discharged to:: Skilled nursing facility                                 Additional Comments: From [facility] working with PT/OT      Prior Functioning/Environment Prior Level of Function : Needs assist       Physical Assist : Mobility  (physical);ADLs (physical)     Mobility Comments: At SNF, working on ambulation short distances without DME, reliant on w/c for longer distances; prior to admission to SNF (10/2020 per chart), from home alone mod indep with rollator/RW - per prior PT Evaluation ADLs Comments: At SNF, requires assist from staff for ADLs. Prior to SNF rehab, pt was at home and Independent with ADLs        OT Problem List: Decreased strength;Decreased activity tolerance;Impaired balance (sitting and/or standing);Cardiopulmonary status limiting activity;Decreased knowledge of use of DME or AE;Decreased safety awareness      OT Treatment/Interventions: Self-care/ADL training;Therapeutic exercise;Energy conservation;DME and/or AE instruction;Therapeutic activities;Patient/family education;Balance training    OT Goals(Current goals can be found in the care plan section) Acute Rehab OT Goals Patient Stated Goal: agreeable to get up in chair, go to a different rehab OT Goal Formulation: With patient Time For Goal Achievement: 12/14/20 Potential to Achieve Goals: Good  OT Frequency: Min 2X/week   Barriers to D/C:            Co-evaluation PT/OT/SLP Co-Evaluation/Treatment: Yes Reason for Co-Treatment: Complexity of the patient's impairments (multi-system involvement);Other (comment) (CRRT)   OT goals addressed during session: ADL's and self-care;Other (comment) (functional transfers)      AM-PAC OT "6 Clicks" Daily Activity     Outcome Measure Help from another person eating meals?: A Little Help  from another person taking care of personal grooming?: A Little Help from another person toileting, which includes using toliet, bedpan, or urinal?: A Lot Help from another person bathing (including washing, rinsing, drying)?: A Lot Help from another person to put on and taking off regular upper body clothing?: A Little Help from another person to put on and taking off regular lower body clothing?: A Lot 6  Click Score: 15   End of Session Equipment Utilized During Treatment: Oxygen Nurse Communication: Mobility status;Other (comment) (RN present during session)  Activity Tolerance: Patient tolerated treatment well Patient left: in chair;with call bell/phone within reach  OT Visit Diagnosis: Unsteadiness on feet (R26.81);Other abnormalities of gait and mobility (R26.89);Muscle weakness (generalized) (M62.81)                Time: 3267-1245 OT Time Calculation (min): 15 min Charges:  OT General Charges $OT Visit: 1 Visit OT Evaluation $OT Eval Moderate Complexity: 1 Mod  Malachy Chamber, OTR/L Acute Rehab Services Office: [phone]   Layla Maw 11/30/2020, 1:16 PM

## 2020-11-30 NOTE — Progress Notes (Signed)
Physical Therapy Treatment Patient Details Name: William Flores MRN: [id number] DOB: [birth year] Today's Date: 11/30/2020   History of Present Illness Pt is a 70 y.o. male admitted from [facility] on 96/75/91 with acute exacerbation of CHF, severe pulmonary hypertension. S/p R paracentesis on 11/22. CRRT initiated 11/25. PMH includes a-fib, HTN, DM, CKD stage III, CAD, HLD, sleep apnea, cardiomyopathy, cirrhosis, acute on chronic respiratory failure (2L O2 baseline), obesity.    PT Comments    Pt now on CRRT so mobility limited bed to chair. Continue to work toward improving mobility and continue to recommend return [facility].    Recommendations for follow up therapy are one component of a multi-disciplinary discharge planning process, led by the attending physician.  Recommendations may be updated based on patient status, additional functional criteria and insurance authorization.  Follow Up Recommendations  Skilled nursing-short term rehab (<3 hours/day)     Assistance Recommended at Discharge    Equipment Recommendations  Rolling walker (2 wheels);BSC/3in1    Recommendations for Other Services       Precautions / Restrictions Precautions Precautions: Fall;Other (comment) Precaution Comments: Watch HR, SpO2 (wears 2L O2 baseline per chart); CRRT Restrictions Weight Bearing Restrictions: No     Mobility  Bed Mobility Overal bed mobility: Needs Assistance Bed Mobility: Supine to Sit     Supine to sit: Min assist;HOB elevated;+2 for safety/equipment     General bed mobility comments: Assist to elevate trunk into sitting    Transfers Overall transfer level: Needs assistance Equipment used: 2 person hand held assist Transfers: Sit to/from Stand;Bed to chair/wheelchair/BSC Sit to Stand: Min assist;+2 safety/equipment     Step pivot transfers: Min assist;+2 safety/equipment     General transfer comment: Assist for balance and support of 2 hand-held with RN assisting  with CRRT lines    Ambulation/Gait               General Gait Details: Limited by CRRT   Stairs             Wheelchair Mobility    Modified Rankin (Stroke Patients Only)       Balance Overall balance assessment: Needs assistance Sitting-balance support: No upper extremity supported;Feet supported Sitting balance-Leahy Scale: Fair     Standing balance support: Single extremity supported Standing balance-Leahy Scale: Poor Standing balance comment: UE support                            Cognition Arousal/Alertness: Awake/alert Behavior During Therapy: WFL for tasks assessed/performed Overall Cognitive Status: No family/caregiver present to determine baseline cognitive functioning Area of Impairment: Following commands;Safety/judgement;Awareness;Problem solving                       Following Commands: Follows one step commands with increased time Safety/Judgement: Decreased awareness of safety Awareness: Emergent Problem Solving: Requires verbal cues General Comments: Pt pleasant, follows directions consistently and benefits from mutimodal cues. Did not formally assess cognition but functional for basic tasks        Exercises      General Comments General comments (skin integrity, edema, etc.): SpO2 WFL on supplemental O2, BP 100/60 after transfer, HR stable. RN In to assist with CRRT line mgmt for safety      Pertinent Vitals/Pain Pain Assessment: No/denies pain Pain Intervention(s): Monitored during session    Home Living Family/patient expects to be discharged to:: Skilled nursing facility  Additional Comments: From [facility] working with PT/OT    Prior Function            PT Goals (current goals can now be found in the care plan section) Progress towards PT goals: Progressing toward goals    Frequency    Min 2X/week      PT Plan Current plan remains appropriate    Co-evaluation  PT/OT/SLP Co-Evaluation/Treatment: Yes Reason for Co-Treatment: Complexity of the patient's impairments (multi-system involvement) PT goals addressed during session: Mobility/safety with mobility OT goals addressed during session: ADL's and self-care;Other (comment) (functional transfers)      AM-PAC PT "6 Clicks" Mobility   Outcome Measure  Help needed turning from your back to your side while in a flat bed without using bedrails?: A Little Help needed moving from lying on your back to sitting on the side of a flat bed without using bedrails?: A Little Help needed moving to and from a bed to a chair (including a wheelchair)?: Total Help needed standing up from a chair using your arms (e.g., wheelchair or bedside chair)?: Total Help needed to walk in hospital room?: Total Help needed climbing 3-5 steps with a railing? : Total 6 Click Score: 10    End of Session Equipment Utilized During Treatment: Oxygen Activity Tolerance: Other (comment) (limited by CRRT lines) Patient left: in chair;with call bell/phone within reach Nurse Communication: Mobility status (nurse present for transfer) PT Visit Diagnosis: Other abnormalities of gait and mobility (R26.89);Muscle weakness (generalized) (M62.81)     Time: 5009-3818 PT Time Calculation (min) (ACUTE ONLY): 23 min  Charges:  $Therapeutic Activity: 8-22 mins                     Randall Pager [phone] [place] 11/30/2020, 4:51 PM

## 2020-11-30 NOTE — Progress Notes (Signed)
ANTICOAGULATION CONSULT NOTE  Pharmacy Consult for warfarin > heparin while on CRRT Indication: atrial fibrillation  Allergies  Allergen Reactions   Codeine     Headache     Patient Measurements: Height: 5' 7"  (170.2 cm) Weight: 122.6 kg (270 lb 4.5 oz) IBW/kg (Calculated) : 66.1  Vital Signs: Temp: 97.2 F (36.2 C) (11/28 1559) Temp Source: Axillary (11/28 1559) BP: 100/60 (11/28 1700) Pulse Rate: 75 (11/28 1700)  Labs: Recent Labs    11/28/20 0316 11/28/20 1600 11/29/20 0320 11/29/20 1600 11/29/20 2151 11/30/20 0320 11/30/20 1510 11/30/20 1713  HGB 7.8*  --  7.7*  --   --  7.8*  --   --   HCT 29.4*  --  28.3*  --   --  29.3*  --   --   PLT 98*  --  113*  --   --  111*  --   --   LABPROT 29.9*  --  26.4*  --   --  19.6*  --   --   INR 2.9*  --  2.4*  --   --  1.7*  --   --   HEPARINUNFRC  --   --   --   --   --   --   --  <0.10*  CREATININE 2.00*   < > 1.68*   < > 1.90* 1.81* 1.64*  --    < > = values in this interval not displayed.     Estimated Creatinine Clearance: 54.1 mL/min (A) (by C-G formula based on SCr of 1.64 mg/dL (H)).   Medical History: Past Medical History:  Diagnosis Date   CAD (coronary artery disease)    LHC 2/08:  mRCA 50%, o/w no sig CAD, EF 25%   Cardiomyopathy ([place])    EF 35-40% in 2/16 in setting of AF with RVR >> echo 5/16 with improved LVF with EF 65-70%   Chronic atrial fibrillation ([facility])    Chronic diastolic heart failure ([facility])    HFPF - EF ~65-70% (OFTEN EXACERBATED BY AFIB)   CKD (chronic kidney disease)    hx of worsening renal failure and hyperK+ in setting of acute diast CHF >> required CVVHD in 4/16 and dialysis x 1 in 5/16   CKD stage 3 due to type 2 diabetes mellitus ([place]) 09/17/2015   Colon cancer ([place])    Colon polyps 09/21/2012   Tubular adenoma   Diabetes ([place])    Hyperlipidemia    Hypertension    Obesity hypoventilation syndrome ([place])    Renal insufficiency    Sleep apnea     Medications:    prismasol  BGK 4/2.5 500 mL/hr at 11/30/20 0721    prismasol BGK 4/2.5 200 mL/hr at 11/30/20 1738   sodium chloride 5 mL/hr at 11/30/20 1700   amiodarone 30 mg/hr (11/30/20 1700)   heparin 1,250 Units/hr (11/30/20 1700)   prismasol BGK 4/2.5 1,500 mL/hr at 11/30/20 1653     Assessment: Pharmacy consulted to dose warfarin in patient with atrial fibrillation.  INR on admission is 2.7.  Patient is an unreliable historian.  Home dose listed as 7.5 mg daily per med rec.    Patient recently admitted here in October for GI bleed and per GI recommended INR 2-2.5.  INR down to 1.7 today after holding since 11/23.  No overt bleeding or complications noted currently.  Heparin level undetectable this pm.  Goal of Therapy:  INR 2-2.5 Monitor platelets by anticoagulation protocol: Yes   Plan:  Increase IV heparin to 1500 units/hr. Check heparin level in 6 hrs. Daily heparin level and CBC.  Alanda Slim, PharmD, [facility] Clinical Pharmacist Please see AMION for all Pharmacists' Contact Phone Numbers 11/30/2020, 6:14 PM

## 2020-11-30 NOTE — Telephone Encounter (Signed)
Description   INR was 1.7 today (goal is 2-3) Slightly low, likely because we held for couple days.  Continue taking 1 tablet a day take 5 mg the everyday Recheck in 1-2 weeks    Caryl Pina, MD [facility] 11/30/2020, 11:48 AM

## 2020-11-30 NOTE — Progress Notes (Signed)
Orthopedic Tech Progress Note Patient Details:  BARLOW HARRISON [birth year] [id number]  Ortho Devices Type of Ortho Device: Haematologist Ortho Device/Splint Location: BIL Ortho Device/Splint Interventions: Ordered, Application   Post Interventions Patient Tolerated: Well Instructions Provided: Care of device Unna boots applied.  [name] 11/30/2020, 3:48 PM

## 2020-11-30 NOTE — Progress Notes (Addendum)
Patient ID: William Flores, male   DOB: 1950-10-10, 70 y.o.   MRN: [id number]    Advanced Heart Failure Rounding Note   Subjective:    Off milrinone and NE. Co-ox 71%.  4.2L in fluid removal off CVVH yesterday. Wt down 6 lb. 17 lb total. Remains anuric. CVP ~13 on my read. CVVH currently pulling 150/hr.   Remains in Afib, rate controlled on amio gtt.   Feels ok currently. Denies dyspnea.    Objective:   Weight Range:  Vital Signs:   Temp:  [97.3 F (36.3 C)-98.5 F (36.9 C)] 97.3 F (36.3 C) (11/28 0630) Pulse Rate:  [83-113] 92 (11/28 0700) Resp:  [16-31] 22 (11/28 0700) BP: (84-120)/(40-93) 105/55 (11/28 0700) SpO2:  [89 %-100 %] 97 % (11/28 0700) FiO2 (%):  [40 %] 40 % (11/28 0500) Weight:  [122.6 kg] 122.6 kg (11/28 0326) Last BM Date: 11/25/20  Weight change: Filed Weights   11/28/20 0324 11/29/20 0500 11/30/20 0326  Weight: 129.3 kg 125.3 kg 122.6 kg    Intake/Output:   Intake/Output Summary (Last 24 hours) at 11/30/2020 0726 Last data filed at 11/30/2020 0700 Gross per 24 hour  Intake 1981.15 ml  Output 4247 ml  Net -2265.85 ml    PHYSICAL EXAM: CVP 13  General:  fatigued appearing. No respiratory difficulty HEENT: normal Neck: supple.+ Rt IJ HD cath. JVD 12 cm Carotids 2+ bilat; no bruits. No lymphadenopathy or thyromegaly appreciated. Cor: PMI nondisplaced. Irregularly irregular rhythm. No rubs, gallops or murmurs. Lungs: clear Abdomen: soft, nontender, nondistended. No hepatosplenomegaly. No bruits or masses. Good bowel sounds. Extremities: no cyanosis, clubbing, rash, 2+ bilateral LE edema up to knees  Neuro: alert & oriented x 3, cranial nerves grossly intact. moves all 4 extremities w/o difficulty. Affect pleasant.   Telemetry: Afib 80s (personally reviewed)  Labs: Basic Metabolic Panel: Recent Labs  Lab 11/26/20 0450 11/27/20 0500 11/27/20 1953 11/28/20 0316 11/28/20 1600 11/29/20 0320 11/29/20 1600 11/29/20 2151 11/30/20 0320   NA 135   < >  --  131* 134* 135 135 135 135  K 3.8   < >  --  4.2 4.3 4.2 4.4 4.4 4.5  CL 97*   < >  --  97* 101 103 100 102 102  CO2 31   < >  --  29 29 29 28 26 27   GLUCOSE 210*   < >  --  249* 195* 120* 219* 150* 117*  BUN 68*   < >  --  45* 32* 26* 22 22 20   CREATININE 2.46*   < >  --  2.00* 1.71* 1.68* 1.69* 1.90* 1.81*  CALCIUM 8.1*   < >  --  7.8* 8.0* 8.0* 8.1* 7.9* 8.1*  MG 2.4  --  2.6* 2.4  --  2.4  --   --  2.4  PHOS  --    < >  --  3.4 2.8 2.2* 10.8* 3.9 3.2   < > = values in this interval not displayed.    Liver Function Tests: Recent Labs  Lab 11/28/20 1600 11/29/20 0320 11/29/20 1600 11/29/20 2151 11/30/20 0320  ALBUMIN 2.7* 2.5* 2.7* 2.6* 2.7*   No results for input(s): LIPASE, AMYLASE in the last 168 hours. Recent Labs  Lab 11/25/20 0945 11/27/20 1145  AMMONIA 48* 49*    CBC: Recent Labs  Lab 11/26/20 0450 11/27/20 0500 11/27/20 1941 11/28/20 0316 11/29/20 0320 11/30/20 0320  WBC 4.8 5.0  --  4.9 4.4 3.9*  HGB  7.7* 8.2* 10.5* 7.8* 7.7* 7.8*  HCT 28.6* 30.6* 31.0* 29.4* 28.3* 29.3*  MCV 92.6 93.0  --  92.2 92.2 93.3  PLT 94* 98*  --  98* 113* 111*    Cardiac Enzymes: No results for input(s): CKTOTAL, CKMB, CKMBINDEX, TROPONINI in the last 168 hours.  BNP: BNP (last 3 results) Recent Labs    10/17/20 0428 11/20/20 1128 11/22/20 0425  BNP 290.0* 306.1* 270.0*    ProBNP (last 3 results) No results for input(s): PROBNP in the last 8760 hours.    Other results:  Imaging: No results found.   Medications:     Scheduled Medications:  allopurinol  300 mg Oral Daily   atorvastatin  20 mg Oral Daily   Chlorhexidine Gluconate Cloth  6 each Topical Daily   darbepoetin (ARANESP) injection - NON-DIALYSIS  100 mcg Subcutaneous Q Sun-1800   insulin aspart  0-5 Units Subcutaneous QHS   insulin aspart  0-9 Units Subcutaneous TID WC   insulin glargine-yfgn  16 Units Subcutaneous QHS   lactulose  20 g Oral BID   mouth rinse  15 mL Mouth  Rinse BID   midodrine  5 mg Oral TID WC   pantoprazole  40 mg Oral Daily   polyethylene glycol  17 g Oral Daily   sildenafil  20 mg Oral TID   sodium chloride flush  10-40 mL Intracatheter Q12H   tamsulosin  0.4 mg Oral Daily   Zinc Oxide   Topical BID    Infusions:   prismasol BGK 4/2.5 500 mL/hr at 11/30/20 0721    prismasol BGK 4/2.5 200 mL/hr at 11/29/20 1722   sodium chloride 5 mL/hr at 11/30/20 0700   amiodarone 30 mg/hr (11/30/20 0700)   ferric gluconate (FERRLECIT) IVPB Stopped (11/29/20 1228)   norepinephrine (LEVOPHED) Adult infusion Stopped (11/29/20 0746)   prismasol BGK 4/2.5 1,500 mL/hr at 11/30/20 0637    PRN Medications: sodium chloride, acetaminophen, alum & mag hydroxide-simeth, guaiFENesin-dextromethorphan, heparin, heparin, hydrocortisone cream, melatonin, sodium chloride flush   Assessment/Plan:    1. Cor pulmonale/RV failure: Suspect primarily due to OHS/OSA. Echo in 4/21 with EF 55-60%, RV severely dilated w/ severe RV dysfunction in the setting of severe pulmonary hypertension w/ PASP 76 mmHg. [place] 4/21 with elevated filling pressures, mixed pulmonary venous/pulmonary hypertension=> diuresed w/ IV Lasix in hospital and placed on po torsemide. Echo in 11/22 showed EF 55-60%, mod LVH, D-shaped septum, severe RV enlargement with severely decreased RV systolic function, IVC dilated. NYHA class IV. Now off milrinone and NE. Co-ox 71%. On CVVH, currently pulling 150/hr . CVP 13 - Continue midodrine 5 mg tid for BP support  - Continue CVVH, would aim for net negative 100-150 cc/hr UF again today, may be near euvolemic tomorrow.  - Reapply unna boots  2. Pulmonary HTN: PASP elevated at 76 mmHg by 4/21 echo estimate with RV systolic function severely reduced. Most likely poorly controlled OHS/OSA as cause (group 3 PH).  V/Q scan negative for chronic PE.  Mixed PVH/PAH on RHC.  Echo in 10/22 with severe RV failure and again this admission.   - Continue sildenafil 20 mg  tid.  - Continue home oxygen and CPAP at night.    3. AKI on Stage IIIb CKD: Now on CVVH and anuric.  I think he would be a difficult long-term HD candidate (lives in SNF).   4. Atrial fibrillation: Chronic, unlikely to be able to successfully cardiovert given long-standing atrial fibrillation.   - INR 1.7 today   -  Amiodarone gtt for rate control.    - Continue warfarin (has not had coverage for DOACs).  5. OSA/OHS: Currently on [place].  At home, CPAP + oxygen.   6. Diabetes: - Management per primary team.  7. MGUS: Previously followed by hem/onc but has not seen in several years.  Multiple myeloma panel 4/21 with presence of M-spike. 8. Cirrhosis: ?Due to NAFLD.  11/23 Ammonia 48. Paracentesis 2.4 L this admission.  - Continue lactulose.  9. Anemia: Hgb 7.8. No obvious source of bleeding.  Has had IV Fe.   - Transfuse < 7.  10. Encephalopathy: Suspect CO2 narcosis + possible component hepatic encephalopathy with elevated NH3.  Alert/oriented this morning.    11. Thrombocytopenia: In setting of acute inflammatory illness and possibly splenic sequestration with cirrhosis.  12. GOC: I will consulted palliative care for goals of care.  Lives in SNF, not ideal long-term HD candidate.  Suspect end stage RV failure. Currently he is electing to be full code.    Lyda Jester, PA-C  7:26 AM  Patient seen with PA, agree with the above note.   He is now off milrinone. Co-ox 71%.  CVP around 13.  Weight down 6 lbs, CVVH pulling net negative 150 cc/hr.  No complaints, remains clear mentally. HR controlled on amiodarone.   General: NAD Neck: JVP 12 cm, no thyromegaly or thyroid nodule.  Lungs: Clear to auscultation bilaterally with normal respiratory effort. CV: Nondisplaced PMI.  Heart irregular S1/S2, no S3/S4, no murmur.  Trace ankle edema.  Abdomen: Soft, nontender, no hepatosplenomegaly, no distention.  Skin: Intact without lesions or rashes.  Neurologic: Alert and oriented x 3.  Psych:  Normal affect. Extremities: No clubbing or cyanosis.  HEENT: Normal.   Still volume overloaded but improving.  Continue current CVVH, pulling 150 cc/hr net today.  Hopefully can stop CVVH soon but suspect he will need ongoing RRT (anuric currently).   Stable off milrinone.   Atrial fibrillation is rate-controlled.  Will start heparin gtt today with INR < 2.   Palliative care following, wants full code/all interventions.  Lives in SNF.  I suspect he will end up HD-dependent, which will be an issue living in SNF.  Will need ongoing discussions with renal.   CRITICAL CARE Performed by: Loralie Champagne  Total critical care time: 35 minutes  Critical care time was exclusive of separately billable procedures and treating other patients.  Critical care was necessary to treat or prevent imminent or life-threatening deterioration.  Critical care was time spent personally by me on the following activities: development of treatment plan with patient and/or surrogate as well as nursing, discussions with consultants, evaluation of patient's response to treatment, examination of patient, obtaining history from patient or surrogate, ordering and performing treatments and interventions, ordering and review of laboratory studies, ordering and review of radiographic studies, pulse oximetry and re-evaluation of patient's condition.   Loralie Champagne 11/30/2020 8:51 AM

## 2020-11-30 NOTE — Telephone Encounter (Signed)
Fax received mdINR PT/INR self testing service Test date/time 11/30/20 1007 am INR 1.7

## 2020-11-30 NOTE — Progress Notes (Signed)
NAME:  William Flores, MRN:  [id number], DOB:  07-Dec-1950, LOS: 53 ADMISSION DATE:  11/22/2020, CONSULTATION DATE: 11/27/2020 REFERRING MD: [facility], CHIEF COMPLAINT: Volume overload renal failure  History of Present Illness:  William Flores is a 70 year old nursing home resident with acute on chronic right heart failure cor pulmonale severe pulmonary hypertension, metabolic encephalopathy, acute on chronic anemia chronic with acute respiratory failure, cirrhosis secondary to Karlene Lineman, chronic atrial fibrillation acute kidney injury, thrombocytopenia diabetes mellitus obstructive sleep apnea who presents with worsening volume overload and pulmonary critical care is asked to transfer to intensive care place a hemodialysis catheter so that he can undergo CRRT.  Pertinent  Medical History   Past Medical History:  Diagnosis Date   CAD (coronary artery disease)    LHC 2/08:  mRCA 50%, o/w no sig CAD, EF 25%   Cardiomyopathy ([place])    EF 35-40% in 2/16 in setting of AF with RVR >> echo 5/16 with improved LVF with EF 65-70%   Chronic atrial fibrillation (HCC)    Chronic diastolic heart failure (HCC)    HFPF - EF ~65-70% (OFTEN EXACERBATED BY AFIB)   CKD (chronic kidney disease)    hx of worsening renal failure and hyperK+ in setting of acute diast CHF >> required CVVHD in 4/16 and dialysis x 1 in 5/16   CKD stage 3 due to type 2 diabetes mellitus ([place]) 09/17/2015   Colon cancer ([place])    Colon polyps 09/21/2012   Tubular adenoma   Diabetes ([place])    Hyperlipidemia    Hypertension    Obesity hypoventilation syndrome ([place])    Renal insufficiency    Sleep apnea     Significant Hospital Events: Including procedures, antibiotic start and stop dates in addition to other pertinent events   11/27/2020 consult to pulmonary critical care 11/27/2020 transfer to intensive care unit place hemodialysis catheter  Interim History / Subjective:  He denies complaints.   Objective   Blood pressure 96/71, pulse 89,  temperature (!) 97.3 F (36.3 C), temperature source Oral, resp. rate 17, height 5' 7"  (1.702 m), weight 122.6 kg, SpO2 98 %. CVP:  [5 mmHg-15 mmHg] 12 mmHg  FiO2 (%):  [40 %] 40 %   Intake/Output Summary (Last 24 hours) at 11/30/2020 0842 Last data filed at 11/30/2020 0800 Gross per 24 hour  Intake 1974.24 ml  Output 4338 ml  Net -2363.76 ml    Filed Weights   11/28/20 0324 11/29/20 0500 11/30/20 0326  Weight: 129.3 kg 125.3 kg 122.6 kg    Examination: General: chronically ill appearing man lying in bed in NAD HEENT: [place]/AT, eyes anicteric Neuro: sleepy but arouses to stimulation, moves all extremities on command Chest: breathing comfortably on [place], CTA anteriorly Heart: S1S2, RRR Abdomen: soft, NT, ND Skin: venous stasis dermatitis BLE, no wounds  WBC 3.9 H/H 7.8/29.3 Platelets 111 INR 1.7 Coox 71%  Resolved Hospital Problem list     Assessment & Plan:  Acute on chronic systolic right heart failure with cor pulmonale and severe pulmonary hypertension with worsening volume overload. Cardiogenic shock -con't CRRT with goal net negative volume removal -stopping milrinone -tele monitoring -con't midodrine -con't sildenafil  Acute on chronic hypoxic/hypercapnic respiratory failure OSA Likely OHS -BiPAP QHS + PRN during the day when sleeping -supplemental O2 as required to maintain SpO2 >90%  Chronic A. Fib with rapid ventricular response, now rate controlled -con't amiodarone -heparin  AKI on CKD stage IIIb, Anuric -renally dose meds -strict I/Os -Con't CRRT. Hopefully can transition  to iHD later this week. If he cannot tolerate iHD, I do not anticipate this will be a survivable illness.   Cirrhosis with elevated ammonia level status post paracentesis with 2.4 L removed -CRRT -con't lactulose  Anemia of chronic disease -transfuse for Hb<7 or hemodynamically significant beleding -monitor  Chronic thrombocytopenia, likely 2/2 liver  failure -monitor  Diabetes mellitus -Con't lantus -SSI PRN -goal BG [phone]  Acute metabolic encephalopathy -avoid sedating meds -BiPAP whenever sleeping -OOB mobility as able -day night reorientation  Morbid obesity -recommend long-term modest weight loss.  Concern for dysphagia -SLP consult ordered  GERD -con't PTA protonix  GoC -Previous discussions that he wants everything done. Moving forward, this will depend on his ability to tolerate intermittent HD most likely.   Best Practice (right click and "Reselect all SmartList Selections" daily)   Diet/type: NPO  DVT prophylaxis: Coumadin GI prophylaxis: PPI Lines: Dialysis Catheter Foley:  N/A Code Status:  full code Last date of multidisciplinary goals of care discussion. Continue palliative involvement pending trajectory.   Labs   CBC: Recent Labs  Lab 11/26/20 0450 11/27/20 0500 11/27/20 1941 11/28/20 0316 11/29/20 0320 11/30/20 0320  WBC 4.8 5.0  --  4.9 4.4 3.9*  HGB 7.7* 8.2* 10.5* 7.8* 7.7* 7.8*  HCT 28.6* 30.6* 31.0* 29.4* 28.3* 29.3*  MCV 92.6 93.0  --  92.2 92.2 93.3  PLT 94* 98*  --  98* 113* 111*     Basic Metabolic Panel: Recent Labs  Lab 11/26/20 0450 11/27/20 0500 11/27/20 1953 11/28/20 0316 11/28/20 1600 11/29/20 0320 11/29/20 1600 11/29/20 2151 11/30/20 0320  NA 135   < >  --  131* 134* 135 135 135 135  K 3.8   < >  --  4.2 4.3 4.2 4.4 4.4 4.5  CL 97*   < >  --  97* 101 103 100 102 102  CO2 31   < >  --  29 29 29 28 26 27   GLUCOSE 210*   < >  --  249* 195* 120* 219* 150* 117*  BUN 68*   < >  --  45* 32* 26* 22 22 20   CREATININE 2.46*   < >  --  2.00* 1.71* 1.68* 1.69* 1.90* 1.81*  CALCIUM 8.1*   < >  --  7.8* 8.0* 8.0* 8.1* 7.9* 8.1*  MG 2.4  --  2.6* 2.4  --  2.4  --   --  2.4  PHOS  --    < >  --  3.4 2.8 2.2* 10.8* 3.9 3.2   < > = values in this interval not displayed.    GFR: Estimated Creatinine Clearance: 49 mL/min (A) (by C-G formula based on SCr of 1.81 mg/dL  (H)). Recent Labs  Lab 11/27/20 0500 11/27/20 1100 11/28/20 0316 11/29/20 0320 11/30/20 0320  WBC 5.0  --  4.9 4.4 3.9*  LATICACIDVEN  --  0.7  --   --   --        This patient is critically ill with multiple organ system failure which requires frequent high complexity decision making, assessment, support, evaluation, and titration of therapies. This was completed through the application of advanced monitoring technologies and extensive interpretation of multiple databases. During this encounter critical care time was devoted to patient care services described in this note for 38 minutes.  Julian Hy, DO 11/30/20 9:06 AM [facility]

## 2020-11-30 NOTE — TOC Progression Note (Addendum)
Transition of [facility]) - Progression Note    Patient Details  Name: William Flores MRN: [id number] Date of Birth: 1950-07-31  Transition of Care [facility]) CM/SW [place], [place] Phone Number: 11/30/2020, 9:34 AM  Clinical Narrative:    HF CSW reached out to [place] at [facility], SNF to give her an update that [name] may be HD dependent to give her a heads up. Awaiting confirmation about the HD and which days [name] will be needing HD. Pending patients progress.  CSW will continue to follow throughout discharge.   Expected Discharge Plan: [place] Barriers to Discharge: Continued Medical Work up  Expected Discharge Plan and Services Expected Discharge Plan: [place] In-house Referral: Clinical Social Work Discharge Planning Services: CM Consult   Living arrangements for the past 2 months: [place]                                       Social Determinants of Health (SDOH) Interventions Food Insecurity Interventions: Intervention Not Indicated Housing Interventions: Intervention Not Indicated, Other (Comment) (Patient is from SNF, [place])  Readmission Risk Interventions Readmission Risk Prevention Plan 11/23/2020 10/24/2020 10/15/2020  Transportation Screening Complete Complete Complete  PCP or Specialist Appt within 3-5 Days - - -  [place] or [place] - - Complete  Social Work Consult for [place] Planning/Counseling - - Complete  Palliative Care Screening - - Not Applicable  Medication Review Press photographer) Complete Complete Complete  PCP or Specialist appointment within 3-5 days of discharge Complete (No Data) -  [place] or Home Care Consult Complete Complete -  SW Recovery Care/Counseling Consult Complete Complete -  Palliative Care Screening - Not Applicable -  [place] Complete Complete -  Some recent data might be hidden   Suvi Archuletta, MSW,  LCSWA [phone] Heart Failure Social Worker

## 2020-11-30 NOTE — Progress Notes (Addendum)
This chaplain is present with the Pt., notary, and two witnesses for the notarizing of the Pt. Advance Directive:  HCPOA.  The Pt. named Juanda Bond as his healthcare agent. If the healthcare agent is unwilling or unable to serve, the Pt. next choice is Marianne Sofia.  The chaplain gave the Pt. the original AD along with two copies.  The Pt. AD was placed on the bedside table. A copy of the Pt. AD: HCPOA was scanned into the Pt. EMR.  This chaplain is available for F/U spiritual care as needed.  Chaplain Sallyanne Kuster [phone]

## 2020-11-30 NOTE — Progress Notes (Signed)
[facility] NEPHROLOGY PROGRESS NOTE  Assessment/ Plan:  # Acute kidney injury on chronic kidney disease stage IIIb: Likely hemodynamically mediated in the setting of acute exacerbation of congestive heart failure.  Started CRRT after refractory to diuretics; ultrafiltration goal around 150 cc/h.  Remains anuric therefore continue current CRRT prescription.  All 4K bath.  INR 1.7.  Continue daily lab, strict ins and outs.  #  Acute exacerbation of diastolic heart failure: With severe pulmonary hypertension and significant third spacing/volume overload.  On CRRT for volume management after refractory to diuretics.  Still volume overload and high CVP.  Cardiology is following.  # Encephalopathy: Likely multifactorial with hypercapnic respiratory failure/CO2 narcosis and azotemia contributing to possible hepatic encephalopathy with slightly elevated ammonia levels.  Mental status seems to be improving.  #  Anemia of chronic illness: He has severe iron deficiency with an iron saturation of 4% and ferritin of 31.  Treated with IV iron and received ESA on 11/27.  Monitor hemoglobin and transfuse as needed.  # Chronic atrial fibrillation: Developed A. fib with RVR managing with amiodarone.  INR 1.7.  #  Thrombocytopenia: Likely from splenic sequestration in the setting of cirrhosis, no evidence of bleeding diathesis.  Subjective: Seen and examined in ICU.  No urine output.  Tolerating CRRT well with UF around 150 cc an hour.  He is alert awake. Objective Vital signs in last 24 hours: Vitals:   11/30/20 0535 11/30/20 0600 11/30/20 0630 11/30/20 0700  BP:  (!) 93/59 (!) 88/54 (!) 105/55  Pulse:  83 91 92  Resp:  (!) 26 (!) 21 (!) 22  Temp:   (!) 97.3 F (36.3 C)   TempSrc:   Oral   SpO2: 93% 97%  97%  Weight:      Height:       Weight change: -2.7 kg  Intake/Output Summary (Last 24 hours) at 11/30/2020 0740 Last data filed at 11/30/2020 0700 Gross per 24 hour  Intake  1981.15 ml  Output 4247 ml  Net -2265.85 ml       Labs: Basic Metabolic Panel: Recent Labs  Lab 11/29/20 1600 11/29/20 2151 11/30/20 0320  NA 135 135 135  K 4.4 4.4 4.5  CL 100 102 102  CO2 28 26 27   GLUCOSE 219* 150* 117*  BUN 22 22 20   CREATININE 1.69* 1.90* 1.81*  CALCIUM 8.1* 7.9* 8.1*  PHOS 10.8* 3.9 3.2   Liver Function Tests: Recent Labs  Lab 11/29/20 1600 11/29/20 2151 11/30/20 0320  ALBUMIN 2.7* 2.6* 2.7*   No results for input(s): LIPASE, AMYLASE in the last 168 hours. Recent Labs  Lab 11/25/20 0945 11/27/20 1145  AMMONIA 48* 49*   CBC: Recent Labs  Lab 11/26/20 0450 11/27/20 0500 11/27/20 1941 11/28/20 0316 11/29/20 0320 11/30/20 0320  WBC 4.8 5.0  --  4.9 4.4 3.9*  HGB 7.7* 8.2*   < > 7.8* 7.7* 7.8*  HCT 28.6* 30.6*   < > 29.4* 28.3* 29.3*  MCV 92.6 93.0  --  92.2 92.2 93.3  PLT 94* 98*  --  98* 113* 111*   < > = values in this interval not displayed.   Cardiac Enzymes: No results for input(s): CKTOTAL, CKMB, CKMBINDEX, TROPONINI in the last 168 hours. CBG: Recent Labs  Lab 11/29/20 0632 11/29/20 1125 11/29/20 1534 11/29/20 2252 11/30/20 0650  GLUCAP 117* 141* 137* 153* 121*    Iron Studies: No results for input(s): IRON, TIBC, TRANSFERRIN, FERRITIN in the last 72 hours. Studies/Results:  No results found.  Medications: Infusions:   prismasol BGK 4/2.5 500 mL/hr at 11/30/20 0721    prismasol BGK 4/2.5 200 mL/hr at 11/29/20 1722   sodium chloride 5 mL/hr at 11/30/20 0700   amiodarone 30 mg/hr (11/30/20 0700)   ferric gluconate (FERRLECIT) IVPB Stopped (11/29/20 1228)   norepinephrine (LEVOPHED) Adult infusion Stopped (11/29/20 0746)   prismasol BGK 4/2.5 1,500 mL/hr at 11/30/20 4944    Scheduled Medications:  allopurinol  300 mg Oral Daily   atorvastatin  20 mg Oral Daily   Chlorhexidine Gluconate Cloth  6 each Topical Daily   darbepoetin (ARANESP) injection - NON-DIALYSIS  100 mcg Subcutaneous Q Sun-1800   insulin  aspart  0-5 Units Subcutaneous QHS   insulin aspart  0-9 Units Subcutaneous TID WC   insulin glargine-yfgn  16 Units Subcutaneous QHS   lactulose  20 g Oral BID   mouth rinse  15 mL Mouth Rinse BID   midodrine  5 mg Oral TID WC   pantoprazole  40 mg Oral Daily   polyethylene glycol  17 g Oral Daily   sildenafil  20 mg Oral TID   sodium chloride flush  10-40 mL Intracatheter Q12H   tamsulosin  0.4 mg Oral Daily   Zinc Oxide   Topical BID    have reviewed scheduled and prn medications.  Physical Exam: General:NAD, comfortable Heart:RRR, s1s2 nl Lungs: Coarse and decreased basal breath sound, no wheezing Abdomen:soft, Non-tender, non-distended Extremities:Unna boot and edema of LE + Dialysis Access: temp HD catheter   Billy Turvey Prasad Altan Kraai 11/30/2020,7:40 AM  LOS: 8 days

## 2020-12-01 DIAGNOSIS — N179 Acute kidney failure, unspecified: Secondary | ICD-10-CM | POA: Diagnosis not present

## 2020-12-01 DIAGNOSIS — I4891 Unspecified atrial fibrillation: Secondary | ICD-10-CM | POA: Diagnosis not present

## 2020-12-01 DIAGNOSIS — I5033 Acute on chronic diastolic (congestive) heart failure: Secondary | ICD-10-CM | POA: Diagnosis not present

## 2020-12-01 DIAGNOSIS — J9611 Chronic respiratory failure with hypoxia: Secondary | ICD-10-CM | POA: Diagnosis not present

## 2020-12-01 DIAGNOSIS — I50810 Right heart failure, unspecified: Secondary | ICD-10-CM | POA: Diagnosis not present

## 2020-12-01 DIAGNOSIS — G4733 Obstructive sleep apnea (adult) (pediatric): Secondary | ICD-10-CM

## 2020-12-01 DIAGNOSIS — I5031 Acute diastolic (congestive) heart failure: Secondary | ICD-10-CM | POA: Diagnosis not present

## 2020-12-01 DIAGNOSIS — I272 Pulmonary hypertension, unspecified: Secondary | ICD-10-CM | POA: Diagnosis not present

## 2020-12-01 DIAGNOSIS — R570 Cardiogenic shock: Secondary | ICD-10-CM

## 2020-12-01 LAB — CBC
HCT: 31 % — ABNORMAL LOW (ref 39.0–52.0)
Hemoglobin: 8.4 g/dL — ABNORMAL LOW (ref 13.0–17.0)
MCH: 25.5 pg — ABNORMAL LOW (ref 26.0–34.0)
MCHC: 27.1 g/dL — ABNORMAL LOW (ref 30.0–36.0)
MCV: 94.2 fL (ref 80.0–100.0)
Platelets: 113 10*3/uL — ABNORMAL LOW (ref 150–400)
RBC: 3.29 MIL/uL — ABNORMAL LOW (ref 4.22–5.81)
RDW: 20.4 % — ABNORMAL HIGH (ref 11.5–15.5)
WBC: 3.9 10*3/uL — ABNORMAL LOW (ref 4.0–10.5)
nRBC: 0.8 % — ABNORMAL HIGH (ref 0.0–0.2)

## 2020-12-01 LAB — RENAL FUNCTION PANEL
Albumin: 2.7 g/dL — ABNORMAL LOW (ref 3.5–5.0)
Albumin: 3.1 g/dL — ABNORMAL LOW (ref 3.5–5.0)
Anion gap: 6 (ref 5–15)
Anion gap: 7 (ref 5–15)
BUN: 16 mg/dL (ref 8–23)
BUN: 15 mg/dL (ref 8–23)
CO2: 26 mmol/L (ref 22–32)
CO2: 24 mmol/L (ref 22–32)
Calcium: 8.2 mg/dL — ABNORMAL LOW (ref 8.9–10.3)
Calcium: 8.3 mg/dL — ABNORMAL LOW (ref 8.9–10.3)
Chloride: 102 mmol/L (ref 98–111)
Chloride: 101 mmol/L (ref 98–111)
Creatinine, Ser: 1.55 mg/dL — ABNORMAL HIGH (ref 0.61–1.24)
Creatinine, Ser: 1.47 mg/dL — ABNORMAL HIGH (ref 0.61–1.24)
GFR, Estimated: 48 mL/min — ABNORMAL LOW (ref >60)
GFR, Estimated: 52 mL/min — ABNORMAL LOW (ref >60)
Glucose, Bld: 140 mg/dL — ABNORMAL HIGH (ref 70–99)
Glucose, Bld: 176 mg/dL — ABNORMAL HIGH (ref 70–99)
Phosphorus: 2 mg/dL — ABNORMAL LOW (ref 2.5–4.6)
Phosphorus: 4.4 mg/dL (ref 2.5–4.6)
Potassium: 4.1 mmol/L (ref 3.5–5.1)
Potassium: 4.6 mmol/L (ref 3.5–5.1)
Sodium: 134 mmol/L — ABNORMAL LOW (ref 135–145)
Sodium: 132 mmol/L — ABNORMAL LOW (ref 135–145)

## 2020-12-01 LAB — GLUCOSE, CAPILLARY
Glucose-Capillary: 97 mg/dL (ref 70–99)
Glucose-Capillary: 140 mg/dL — ABNORMAL HIGH (ref 70–99)
Glucose-Capillary: 124 mg/dL — ABNORMAL HIGH (ref 70–99)
Glucose-Capillary: 164 mg/dL — ABNORMAL HIGH (ref 70–99)

## 2020-12-01 LAB — PROTIME-INR
INR: 1.6 — ABNORMAL HIGH (ref 0.8–1.2)
Prothrombin Time: 19 seconds — ABNORMAL HIGH (ref 11.4–15.2)

## 2020-12-01 LAB — HEPARIN LEVEL (UNFRACTIONATED)
Heparin Unfractionated: <0.1 IU/mL — ABNORMAL LOW (ref 0.30–0.70)
Heparin Unfractionated: 0.26 IU/mL — ABNORMAL LOW (ref 0.30–0.70)
Heparin Unfractionated: 0.38 IU/mL (ref 0.30–0.70)

## 2020-12-01 LAB — COOXEMETRY PANEL
Carboxyhemoglobin: 1.6 % — ABNORMAL HIGH (ref 0.5–1.5)
Methemoglobin: 0.8 % (ref 0.0–1.5)
O2 Saturation: 61 %
Total hemoglobin: 8.5 g/dL — ABNORMAL LOW (ref 12.0–16.0)

## 2020-12-01 LAB — URIC ACID: Uric Acid, Serum: <1.5 mg/dL — ABNORMAL LOW (ref 3.7–8.6)

## 2020-12-01 LAB — MAGNESIUM: Magnesium: 2.3 mg/dL (ref 1.7–2.4)

## 2020-12-01 LAB — BRAIN NATRIURETIC PEPTIDE: B Natriuretic Peptide: 207.3 pg/mL — ABNORMAL HIGH (ref 0.0–100.0)

## 2020-12-01 MED ORDER — SODIUM PHOSPHATES 45 MMOLE/15ML IV SOLN
30.0000 mmol | Freq: Once | INTRAVENOUS | Status: AC
  Administered 2020-12-01: 30 mmol via INTRAVENOUS
  Filled 2020-12-01: qty 10

## 2020-12-01 MED ORDER — PREDNISONE 20 MG PO TABS
20.0000 mg | ORAL_TABLET | Freq: Every day | ORAL | Status: AC
  Administered 2020-12-04 – 2020-12-06 (×3): 20 mg via ORAL
  Filled 2020-12-01 (×3): qty 1

## 2020-12-01 MED ORDER — AMIODARONE HCL 200 MG PO TABS
ORAL_TABLET | ORAL | Status: AC
  Filled 2020-12-01: qty 1

## 2020-12-01 MED ORDER — AMIODARONE HCL 200 MG PO TABS
200.0000 mg | ORAL_TABLET | Freq: Two times a day (BID) | ORAL | Status: DC
  Administered 2020-12-01 – 2020-12-06 (×12): 200 mg via ORAL
  Filled 2020-12-01 (×11): qty 1

## 2020-12-01 MED ORDER — PREDNISONE 20 MG PO TABS
40.0000 mg | ORAL_TABLET | Freq: Every day | ORAL | Status: AC
  Administered 2020-12-01 – 2020-12-03 (×3): 40 mg via ORAL
  Filled 2020-12-01 (×2): qty 2

## 2020-12-01 MED ORDER — AMIODARONE HCL 200 MG PO TABS: 200.0000 mg | ORAL_TABLET | Freq: Two times a day (BID) | ORAL | Status: DC

## 2020-12-01 MED ORDER — PREDNISONE 20 MG PO TABS: 20.0000 mg | ORAL_TABLET | Freq: Every day | ORAL | Status: DC

## 2020-12-01 MED ORDER — INSULIN GLARGINE-YFGN 100 UNIT/ML ~~LOC~~ SOLN
14.0000 [IU] | Freq: Every day | SUBCUTANEOUS | Status: DC
  Administered 2020-12-01 – 2020-12-02 (×2): 14 [IU] via SUBCUTANEOUS
  Filled 2020-12-01 (×3): qty 0.14

## 2020-12-01 MED ORDER — PREDNISONE 20 MG PO TABS: 40.0000 mg | ORAL_TABLET | Freq: Every day | ORAL | Status: DC

## 2020-12-01 MED ORDER — PREDNISONE 20 MG PO TABS
ORAL_TABLET | ORAL | Status: AC
  Filled 2020-12-01: qty 2

## 2020-12-01 MED ORDER — OXYCODONE HCL 5 MG PO TABS
ORAL_TABLET | ORAL | Status: AC
  Filled 2020-12-01: qty 2

## 2020-12-01 MED ORDER — HYDROXYZINE HCL 25 MG PO TABS
ORAL_TABLET | ORAL | Status: AC
  Filled 2020-12-01: qty 1

## 2020-12-01 MED ORDER — HYDROXYZINE HCL 25 MG PO TABS
25.0000 mg | ORAL_TABLET | Freq: Three times a day (TID) | ORAL | Status: DC | PRN
  Administered 2020-12-01 (×2): 25 mg via ORAL
  Filled 2020-12-01: qty 1

## 2020-12-01 MED ORDER — OXYCODONE HCL 5 MG PO TABS
5.0000 mg | ORAL_TABLET | Freq: Four times a day (QID) | ORAL | Status: DC | PRN
Start: 2020-12-01 — End: 2020-12-12
  Administered 2020-12-01 – 2020-12-08 (×3): 10 mg via ORAL
  Filled 2020-12-01: qty 2

## 2020-12-01 MED ORDER — MIDODRINE HCL 5 MG PO TABS
10.0000 mg | ORAL_TABLET | Freq: Three times a day (TID) | ORAL | Status: DC
  Administered 2020-12-01 – 2020-12-04 (×10): 10 mg via ORAL
  Filled 2020-12-01 (×7): qty 2

## 2020-12-01 NOTE — Progress Notes (Signed)
NAME:  William Flores, MRN:  [id number], DOB:  26-Jul-1950, LOS: 9 ADMISSION DATE:  11/22/2020, CONSULTATION DATE: 11/27/2020 REFERRING MD: [facility], CHIEF COMPLAINT: Volume overload renal failure  History of Present Illness:  William Flores is a 70 year old nursing home resident with acute on chronic right heart failure cor pulmonale severe pulmonary hypertension, metabolic encephalopathy, acute on chronic anemia chronic with acute respiratory failure, cirrhosis secondary to William Flores, chronic atrial fibrillation acute kidney injury, thrombocytopenia diabetes mellitus obstructive sleep apnea who presents with worsening volume overload and pulmonary critical care is asked to transfer to intensive care place a hemodialysis catheter so that he can undergo CRRT.  Pertinent  Medical History   Past Medical History:  Diagnosis Date   CAD (coronary artery disease)    LHC 2/08:  mRCA 50%, o/w no sig CAD, EF 25%   Cardiomyopathy ([place])    EF 35-40% in 2/16 in setting of AF with RVR >> echo 5/16 with improved LVF with EF 65-70%   Chronic atrial fibrillation (HCC)    Chronic diastolic heart failure (HCC)    HFPF - EF ~65-70% (OFTEN EXACERBATED BY AFIB)   CKD (chronic kidney disease)    hx of worsening renal failure and hyperK+ in setting of acute diast CHF >> required CVVHD in 4/16 and dialysis x 1 in 5/16   CKD stage 3 due to type 2 diabetes mellitus ([place]) 09/17/2015   Colon cancer ([place])    Colon polyps 09/21/2012   Tubular adenoma   Diabetes ([place])    Hyperlipidemia    Hypertension    Obesity hypoventilation syndrome ([place])    Renal insufficiency    Sleep apnea     Significant Hospital Events: Including procedures, antibiotic start and stop dates in addition to other pertinent events   11/27/2020 consult to pulmonary critical care 11/27/2020 transfer to intensive care unit place hemodialysis catheter  Interim History / Subjective:  Complaining of R hand pain, similar to gout pain. On allopurinol  PTA.  Objective   Blood pressure 91/60, pulse 83, temperature (!) 97.3 F (36.3 C), temperature source Axillary, resp. rate (!) 24, height 5' 7"  (1.702 m), weight 118.2 kg, SpO2 100 %. CVP:  [10 mmHg-19 mmHg] 11 mmHg  FiO2 (%):  [40 %] 40 %   Intake/Output Summary (Last 24 hours) at 12/01/2020 0734 Last data filed at 12/01/2020 0700 Gross per 24 hour  Intake 2529.63 ml  Output 6242 ml  Net -3712.37 ml    Filed Weights   11/29/20 0500 11/30/20 0326 12/01/20 0501  Weight: 125.3 kg 122.6 kg 118.2 kg    Examination: General: chronically ill appearing man lying in bed in NAD HEENT: [place]/AT, eyes anicteric Neuro: more awake today, moving all extremities Chest: occasional coughing, breathing comfortably on [place], decreased basilar breath sounds Heart: S1S2, irreg rhythm, reg rate Abdomen: obese, soft, NT Skin: legs wrapped, chronic venous stasis changes  WBC 3.9 H/H 8.4/31 Platelets 113 INR 1.6 Coox 61%  Resolved Hospital Problem list     Assessment & Plan:  Acute on chronic systolic right heart failure with cor pulmonale and severe pulmonary hypertension with hypervolemia. Cardiogenic shock; coox lower off milrinone. -con't CRRT to optimize fluid status -daily coox monitoring -tele monitoring -increase midodrine to 27m TID -con't sildenafil TID -unable to tolerate GDMT due to shock  Acute on chronic hypoxic/hypercapnic respiratory failure OSA Likely OHS -BiPAP whenever sleeping; reinforced the importance of compliance today -supplemental O2 as required to maintain SpO2 >90%  Chronic A. Fib with rapid ventricular  response, now rate controlled -transition to enteral amiodarone -con't holding PTA metoprolol -heparin gtt  AKI on CKD stage IIIb, Anuric BPH -renally dose meds, avoid nephrotoxic meds -strict I/Os -No signs of renal recovery so far. Con't CRRT. Not ready to transition to iHD until euvolemic. -consider stopping tamsulosin if no renal recovery  likely  Cirrhosis with elevated ammonia level status post paracentesis with 2.4 L removed Pruritis -optimize volume with CRRT -lactulose -atarax TID PRN  Anemia of chronic disease -transfuse for Hb<7 or hemodynamically significant bleeding -Aranesp, IV iron per Nephrology  Chronic thrombocytopenia, likely 2/2 liver failure -con't to monitor  Diabetes mellitus -decrease lantus to 14 units daily -SSI PRN -goal BG [phone]  Acute metabolic encephalopathy -avoid sedating meds -BiPAP when sleeping -OOB mobility as able  Morbid obesity -recommend long-term modest weight loss  Concern for dysphagia -SLP cleared for reg diet and thin liquids; monitor while eating  GERD -con't PTA protonix  Hand pain-- concern for gout -checking uric acid  GoC -Previous discussions that he wants everything done. Moving forward, this will depend on his ability to tolerate intermittent HD most likely.   Best Practice (right click and "Reselect all SmartList Selections" daily)   Diet/type: Regular consistency (see orders)  DVT prophylaxis: heparin GI prophylaxis: PPI Lines: Dialysis Catheter Foley:  N/A Code Status:  full code Last date of multidisciplinary goals of care discussion. Continue palliative involvement pending trajectory.   Labs   CBC: Recent Labs  Lab 11/27/20 0500 11/27/20 1941 11/28/20 0316 11/29/20 0320 11/30/20 0320 12/01/20 0330  WBC 5.0  --  4.9 4.4 3.9* 3.9*  HGB 8.2* 10.5* 7.8* 7.7* 7.8* 8.4*  HCT 30.6* 31.0* 29.4* 28.3* 29.3* 31.0*  MCV 93.0  --  92.2 92.2 93.3 94.2  PLT 98*  --  98* 113* 111* 113*     Basic Metabolic Panel: Recent Labs  Lab 11/27/20 1953 11/28/20 0316 11/28/20 1600 11/29/20 0320 11/29/20 1600 11/29/20 2151 11/30/20 0320 11/30/20 1510 12/01/20 0330 12/01/20 0335  NA  --  131*   < > 135 135 135 135 133*  --  134*  K  --  4.2   < > 4.2 4.4 4.4 4.5 4.2  --  4.1  CL  --  97*   < > 103 100 102 102 100  --  102  CO2  --  29   < >  29 28 26 27 27   --  26  GLUCOSE  --  249*   < > 120* 219* 150* 117* 193*  --  140*  BUN  --  45*   < > 26* 22 22 20 17   --  16  CREATININE  --  2.00*   < > 1.68* 1.69* 1.90* 1.81* 1.64*  --  1.55*  CALCIUM  --  7.8*   < > 8.0* 8.1* 7.9* 8.1* 8.1*  --  8.2*  MG 2.6* 2.4  --  2.4  --   --  2.4  --  2.3  --   PHOS  --  3.4   < > 2.2* 10.8* 3.9 3.2 2.6  --  2.0*   < > = values in this interval not displayed.    GFR: Estimated Creatinine Clearance: 56.1 mL/min (A) (by C-G formula based on SCr of 1.55 mg/dL (H)). Recent Labs  Lab 11/27/20 1100 11/28/20 0316 11/29/20 0320 11/30/20 0320 12/01/20 0330  WBC  --  4.9 4.4 3.9* 3.9*  LATICACIDVEN 0.7  --   --   --   --  This patient is critically ill with multiple organ system failure which requires frequent high complexity decision making, assessment, support, evaluation, and titration of therapies. This was completed through the application of advanced monitoring technologies and extensive interpretation of multiple databases. During this encounter critical care time was devoted to patient care services described in this note for 36 minutes.  Julian Hy, DO 12/01/20 8:04 AM [facility]

## 2020-12-01 NOTE — Progress Notes (Signed)
ANTICOAGULATION CONSULT NOTE  Pharmacy Consult for warfarin > heparin while on CRRT Indication: atrial fibrillation  Allergies  Allergen Reactions   Codeine     Headache     Patient Measurements: Height: 5' 7"  (170.2 cm) Weight: 118.2 kg (260 lb 9.3 oz) IBW/kg (Calculated) : 66.1  Vital Signs: Temp: 97.6 F (36.4 C) (11/29 1126) Temp Source: Oral (11/29 1126) BP: 105/66 (11/29 1200) Pulse Rate: 84 (11/29 1200)  Labs: Recent Labs    11/29/20 0320 11/29/20 1600 11/30/20 0320 11/30/20 1510 11/30/20 1713 12/01/20 0104 12/01/20 0330 12/01/20 0335 12/01/20 1116  HGB 7.7*  --  7.8*  --   --   --  8.4*  --   --   HCT 28.3*  --  29.3*  --   --   --  31.0*  --   --   PLT 113*  --  111*  --   --   --  113*  --   --   LABPROT 26.4*  --  19.6*  --   --   --  19.0*  --   --   INR 2.4*  --  1.7*  --   --   --  1.6*  --   --   HEPARINUNFRC  --   --   --   --  <0.10* <0.10*  --   --  0.26*  CREATININE 1.68*   < > 1.81* 1.64*  --   --   --  1.55*  --    < > = values in this interval not displayed.     Estimated Creatinine Clearance: 56.1 mL/min (A) (by C-G formula based on SCr of 1.55 mg/dL (H)).   Medical History: Past Medical History:  Diagnosis Date   CAD (coronary artery disease)    LHC 2/08:  mRCA 50%, o/w no sig CAD, EF 25%   Cardiomyopathy ([place])    EF 35-40% in 2/16 in setting of AF with RVR >> echo 5/16 with improved LVF with EF 65-70%   Chronic atrial fibrillation (HCC)    Chronic diastolic heart failure (HCC)    HFPF - EF ~65-70% (OFTEN EXACERBATED BY AFIB)   CKD (chronic kidney disease)    hx of worsening renal failure and hyperK+ in setting of acute diast CHF >> required CVVHD in 4/16 and dialysis x 1 in 5/16   CKD stage 3 due to type 2 diabetes mellitus ([place]) 09/17/2015   Colon cancer ([place])    Colon polyps 09/21/2012   Tubular adenoma   Diabetes ([place])    Hyperlipidemia    Hypertension    Obesity hypoventilation syndrome ([place])    Renal insufficiency     Sleep apnea     Medications:    prismasol BGK 4/2.5 500 mL/hr at 12/01/20 0311    prismasol BGK 4/2.5 200 mL/hr at 11/30/20 1738   sodium chloride Stopped (12/01/20 0856)   heparin 1,800 Units/hr (12/01/20 1100)   prismasol BGK 4/2.5 1,500 mL/hr at 12/01/20 0630   sodium phosphate  Dextrose 5% IVPB 43 mL/hr at 12/01/20 1100     Assessment: Pharmacy consulted to dose warfarin in patient with atrial fibrillation.  INR on admission is 2.7.  Patient is an unreliable historian.  Home dose listed as 7.5 mg daily per med rec.    Patient recently admitted here in October for GI bleed and per GI recommended INR 2-2.5.  INR down to 1.6 today after holding since 11/23.  No overt bleeding or complications noted  currently.  Heparin level undetectable this pm.  Goal of Therapy:  INR 2-2.5 Monitor platelets by anticoagulation protocol: Yes   Plan:  Increase IV heparin to 1900 units/hr. Check heparin level in 6 hrs. Daily heparin level and CBC.  [place] Crane, Roylene Reason, BCCP Clinical Pharmacist  12/01/2020 1:08 PM   [facility] pharmacy phone numbers are listed on [email or web address]

## 2020-12-01 NOTE — Plan of Care (Signed)
  Problem: Education: Goal: Knowledge of General Education information will improve Description: Including pain rating scale, medication(s)/side effects and non-pharmacologic comfort measures Outcome: Progressing   Problem: Clinical Measurements: Goal: Will remain free from infection Outcome: Progressing Goal: Respiratory complications will improve Outcome: Progressing Goal: Cardiovascular complication will be avoided Outcome: Progressing   Problem: Nutrition: Goal: Adequate nutrition will be maintained Outcome: Progressing   Problem: Coping: Goal: Level of anxiety will decrease Outcome: Progressing   Problem: Elimination: Goal: Will not experience complications related to urinary retention Outcome: Progressing   Problem: Pain Managment: Goal: General experience of comfort will improve Outcome: Progressing   Problem: Safety: Goal: Ability to remain free from injury will improve Outcome: Progressing   Problem: Cardiac: Goal: Ability to achieve and maintain adequate cardiopulmonary perfusion will improve Outcome: Progressing   Problem: Elimination: Goal: Will not experience complications related to bowel motility Outcome: Not Progressing

## 2020-12-01 NOTE — Progress Notes (Signed)
Initial Nutrition Assessment  DOCUMENTATION CODES:   Obesity unspecified  INTERVENTION:   30 ml ProSource Plus TID, each supplement provides 100 kcals and 15 grams protein.   B complex with C daily  NUTRITION DIAGNOSIS:   Increased nutrient needs related to acute illness as evidenced by estimated needs.  GOAL:   Patient will meet greater than or equal to 90% of their needs  MONITOR:   PO intake, Supplement acceptance, Labs, Weight trends  REASON FOR ASSESSMENT:   LOS, Rounds    ASSESSMENT:   70 yo male admitted with AKI on CKD 3 requiring CRRT, acute CHF. PMH includes CAD, CHF, CKD 3, colon cancer, DM, HLD, HTN, obesity hypovenitlation syndrome, cirrhosis.  11/20 Admitted 11/22 Paracentesis with 2.4 L removed 11/25 CRRT initiated  Palliative Care following, pt remains full code  Pt remains on CRRT, remains anuric, UF currently 150 ml/hr Possible end stage RV failure  Pt very tired today, unable to give much information with regards to diet and weight history  Pt with good appetite. Recorded po intake 100% of meals, started Pro-Source supplements yesterday and pt taking well.   Current wt 118 kg; admit weight 130 kg. Net negative 16 L. Unsure of dry weight, unsure of UBW  Hypophosphatemia on CRRT, being supplemented  Labs: phosphorus 2.0 (L), uric acid <1.5 Meds: aranesp, ss novolog, semglee, lactulose, miralax, prednisone, sodium phosphate  Diet Order:   Diet Order             Diet 2 gram sodium Room service appropriate? Yes; Fluid consistency: Thin  Diet effective now                   EDUCATION NEEDS:   Not appropriate for education at this time  Skin:  Skin Assessment: Skin Integrity Issues: Skin Integrity Issues:: Stage I Stage I: coccyx  Last BM:  11/29  Height:   Ht Readings from Last 1 Encounters:  11/25/20 5' 7"  (1.702 m)    Weight:   Wt Readings from Last 1 Encounters:  12/01/20 118.2 kg    BMI:  Body mass index is 40.81  kg/m.  Estimated Nutritional Needs:   Kcal:  2100-2300 kcals  Protein:  115-130 g  Fluid:  1.8 L   Kerman Passey MS, RDN, LDN, CNSC Registered Dietitian III Clinical Nutrition RD Pager and On-Call Pager Number Located in [place]

## 2020-12-01 NOTE — Progress Notes (Signed)
[place] for warfarin > heparin while on CRRT Indication: atrial fibrillation  Allergies  Allergen Reactions   Codeine     Headache     Patient Measurements: Height: 5' 7"  (170.2 cm) Weight: 118.2 kg (260 lb 9.3 oz) IBW/kg (Calculated) : 66.1  Vital Signs: Temp: 97.7 F (36.5 C) (11/29 2000) Temp Source: Axillary (11/29 2000) BP: 108/63 (11/29 2100) Pulse Rate: 82 (11/29 2100)  Labs: Recent Labs    11/29/20 0320 11/29/20 1600 11/30/20 0320 11/30/20 1510 11/30/20 1713 12/01/20 0104 12/01/20 0330 12/01/20 0335 12/01/20 1116 12/01/20 2057  HGB 7.7*  --  7.8*  --   --   --  8.4*  --   --   --   HCT 28.3*  --  29.3*  --   --   --  31.0*  --   --   --   PLT 113*  --  111*  --   --   --  113*  --   --   --   LABPROT 26.4*  --  19.6*  --   --   --  19.0*  --   --   --   INR 2.4*  --  1.7*  --   --   --  1.6*  --   --   --   HEPARINUNFRC  --   --   --   --    < > <0.10*  --   --  0.26* 0.38  CREATININE 1.68*   < > 1.81* 1.64*  --   --   --  1.55*  --   --    < > = values in this interval not displayed.     Estimated Creatinine Clearance: 56.1 mL/min (A) (by C-G formula based on SCr of 1.55 mg/dL (H)).   Medical History: Past Medical History:  Diagnosis Date   CAD (coronary artery disease)    LHC 2/08:  mRCA 50%, o/w no sig CAD, EF 25%   Cardiomyopathy ([place])    EF 35-40% in 2/16 in setting of AF with RVR >> echo 5/16 with improved LVF with EF 65-70%   Chronic atrial fibrillation (HCC)    Chronic diastolic heart failure (HCC)    HFPF - EF ~65-70% (OFTEN EXACERBATED BY AFIB)   CKD (chronic kidney disease)    hx of worsening renal failure and hyperK+ in setting of acute diast CHF >> required CVVHD in 4/16 and dialysis x 1 in 5/16   CKD stage 3 due to type 2 diabetes mellitus ([place]) 09/17/2015   Colon cancer ([place])    Colon polyps 09/21/2012   Tubular adenoma   Diabetes ([place])    Hyperlipidemia    Hypertension    Obesity  hypoventilation syndrome ([place])    Renal insufficiency    Sleep apnea     Medications:    prismasol BGK 4/2.5 500 mL/hr at 12/01/20 1326    prismasol BGK 4/2.5 200 mL/hr at 12/01/20 1645   sodium chloride 5 mL/hr at 12/01/20 2100   heparin 1,900 Units/hr (12/01/20 2100)   prismasol BGK 4/2.5 1,500 mL/hr at 12/01/20 2008     Assessment: Pharmacy consulted to dose warfarin in patient with atrial fibrillation.  INR on admission is 2.7.  Patient is an unreliable historian.  Home dose listed as 7.5 mg daily per med rec.    Patient recently admitted here in October for GI bleed and per GI recommended INR 2-2.5.  INR down to  1.6 today after holding since 11/23.  No overt bleeding or complications noted currently.  Heparin level at goal this evening on 1900 units/hr. No bleeding issues noted.   Goal of Therapy:  INR 2-2.5 Monitor platelets by anticoagulation protocol: Yes   Plan:  Continue IV heparin at 1900 units/hr. Daily heparin level and CBC.  Erin Hearing PharmD., BCPS Clinical Pharmacist 12/01/2020 9:38 PM

## 2020-12-01 NOTE — Progress Notes (Addendum)
Patient ID: William Flores, male   DOB: 04/12/1950, 70 y.o.   MRN: [id number]    Advanced Heart Failure Rounding Note   Subjective:    Co-ox down off milrinone, marginal at 61% (down from 80>>71%).   Remains anuric. 6.2L pulled off CVVH yesterday.  Wt down 10 lb. CVP 16. CVVH currently pulling 150/hr.   Remains in Afib, rate controlled on amio gtt. BPs soft, 90s-low 100s. On midodrine.   Main complaint today is rt hand pain. Middle digit MCP joint. H/o gout. Pain feels similar.   Denies dyspnea. No CP.    Objective:   Weight Range:  Vital Signs:   Temp:  [97.2 F (36.2 C)-97.9 F (36.6 C)] 97.3 F (36.3 C) (11/29 0400) Pulse Rate:  [71-96] 83 (11/29 0700) Resp:  [12-26] 24 (11/29 0700) BP: (84-116)/(46-92) 91/60 (11/29 0700) SpO2:  [91 %-100 %] 100 % (11/29 0700) FiO2 (%):  [40 %] 40 % (11/29 0400) Weight:  [118.2 kg] 118.2 kg (11/29 0501) Last BM Date: 11/30/20  Weight change: Filed Weights   11/29/20 0500 11/30/20 0326 12/01/20 0501  Weight: 125.3 kg 122.6 kg 118.2 kg    Intake/Output:   Intake/Output Summary (Last 24 hours) at 12/01/2020 0713 Last data filed at 12/01/2020 0700 Gross per 24 hour  Intake 2529.63 ml  Output 6242 ml  Net -3712.37 ml    PHYSICAL EXAM: CVP 12 General:  well appearing, moderately obese. No respiratory difficulty HEENT: normal Neck: supple.+ Rt IJ HD cath. JVD 10 cm Carotids 2+ bilat; no bruits. No lymphadenopathy or thyromegaly appreciated. Cor: PMI nondisplaced. Irregularly irregular rhythm. No rubs, gallops or murmurs. Lungs: clear Abdomen: soft, nontender, nondistended. No hepatosplenomegaly. No bruits or masses. Good bowel sounds. Extremities: no cyanosis, clubbing, rash, trace-1+ bilateral LE edema, unna boots  Neuro: alert & oriented x 3, cranial nerves grossly intact. moves all 4 extremities w/o difficulty. Affect pleasant.   Telemetry: Afib 80s (personally reviewed)  Labs: Basic Metabolic Panel: Recent Labs  Lab  11/27/20 1953 11/28/20 0316 11/28/20 1600 11/29/20 0320 11/29/20 1600 11/29/20 2151 11/30/20 0320 11/30/20 1510 12/01/20 0330 12/01/20 0335  NA  --  131*   < > 135 135 135 135 133*  --  134*  K  --  4.2   < > 4.2 4.4 4.4 4.5 4.2  --  4.1  CL  --  97*   < > 103 100 102 102 100  --  102  CO2  --  29   < > 29 28 26 27 27   --  26  GLUCOSE  --  249*   < > 120* 219* 150* 117* 193*  --  140*  BUN  --  45*   < > 26* 22 22 20 17   --  16  CREATININE  --  2.00*   < > 1.68* 1.69* 1.90* 1.81* 1.64*  --  1.55*  CALCIUM  --  7.8*   < > 8.0* 8.1* 7.9* 8.1* 8.1*  --  8.2*  MG 2.6* 2.4  --  2.4  --   --  2.4  --  2.3  --   PHOS  --  3.4   < > 2.2* 10.8* 3.9 3.2 2.6  --  2.0*   < > = values in this interval not displayed.    Liver Function Tests: Recent Labs  Lab 11/29/20 1600 11/29/20 2151 11/30/20 0320 11/30/20 1510 12/01/20 0335  ALBUMIN 2.7* 2.6* 2.7* 2.9* 2.7*   No results for input(s):  LIPASE, AMYLASE in the last 168 hours. Recent Labs  Lab 11/25/20 0945 11/27/20 1145  AMMONIA 48* 49*    CBC: Recent Labs  Lab 11/27/20 0500 11/27/20 1941 11/28/20 0316 11/29/20 0320 11/30/20 0320 12/01/20 0330  WBC 5.0  --  4.9 4.4 3.9* 3.9*  HGB 8.2* 10.5* 7.8* 7.7* 7.8* 8.4*  HCT 30.6* 31.0* 29.4* 28.3* 29.3* 31.0*  MCV 93.0  --  92.2 92.2 93.3 94.2  PLT 98*  --  98* 113* 111* 113*    Cardiac Enzymes: No results for input(s): CKTOTAL, CKMB, CKMBINDEX, TROPONINI in the last 168 hours.  BNP: BNP (last 3 results) Recent Labs    10/17/20 0428 11/20/20 1128 11/22/20 0425  BNP 290.0* 306.1* 270.0*    ProBNP (last 3 results) No results for input(s): PROBNP in the last 8760 hours.    Other results:  Imaging: No results found.   Medications:     Scheduled Medications:  (feeding supplement) PROSource Plus  30 mL Oral TID BM   allopurinol  300 mg Oral Daily   atorvastatin  20 mg Oral Daily   B-complex with vitamin C  1 tablet Oral Daily   Chlorhexidine Gluconate Cloth   6 each Topical Daily   darbepoetin (ARANESP) injection - NON-DIALYSIS  100 mcg Subcutaneous Q Sun-1800   insulin aspart  0-5 Units Subcutaneous QHS   insulin aspart  0-9 Units Subcutaneous TID WC   insulin glargine-yfgn  16 Units Subcutaneous QHS   lactulose  20 g Oral BID   mouth rinse  15 mL Mouth Rinse BID   midodrine  5 mg Oral TID WC   pantoprazole  40 mg Oral Daily   polyethylene glycol  17 g Oral Daily   sildenafil  20 mg Oral TID   sodium chloride flush  10-40 mL Intracatheter Q12H   tamsulosin  0.4 mg Oral Daily   Zinc Oxide   Topical BID    Infusions:   prismasol BGK 4/2.5 500 mL/hr at 12/01/20 0311    prismasol BGK 4/2.5 200 mL/hr at 11/30/20 1738   sodium chloride 5 mL/hr at 12/01/20 0700   amiodarone 30 mg/hr (12/01/20 0700)   heparin 1,800 Units/hr (12/01/20 0700)   prismasol BGK 4/2.5 1,500 mL/hr at 12/01/20 0630    PRN Medications: sodium chloride, acetaminophen, alum & mag hydroxide-simeth, guaiFENesin-dextromethorphan, heparin, heparin, hydrocortisone cream, lip balm, melatonin, sodium chloride flush   Assessment/Plan:    1. Cor pulmonale/RV failure: Suspect primarily due to OHS/OSA. Echo in 4/21 with EF 55-60%, RV severely dilated w/ severe RV dysfunction in the setting of severe pulmonary hypertension w/ PASP 76 mmHg. [place] 4/21 with elevated filling pressures, mixed pulmonary venous/pulmonary hypertension=> diuresed w/ IV Lasix in hospital and placed on po torsemide. Echo in 11/22 showed EF 55-60%, mod LVH, D-shaped septum, severe RV enlargement with severely decreased RV systolic function, IVC dilated. NYHA class IV. Now off milrinone and NE. Co-ox drifting down, 80>>71>>60% today. On CVVH, currently pulling 150/hr . CVP 16.  - Continue midodrine 5 mg tid for BP support  - ? Trial of iHD. Timing per nephrology  - c/w unna boots  2. Pulmonary HTN: PASP elevated at 76 mmHg by 4/21 echo estimate with RV systolic function severely reduced. Most likely poorly  controlled OHS/OSA as cause (group 3 PH).  V/Q scan negative for chronic PE.  Mixed PVH/PAH on RHC.  Echo in 10/22 with severe RV failure and again this admission.   - Continue sildenafil 20 mg tid.  -  Continue home oxygen and CPAP at night.    3. AKI on Stage IIIb CKD: Now on CVVH and anuric.  I think he would be a difficult long-term HD candidate (lives in SNF).   4. Atrial fibrillation: Chronic, unlikely to be able to successfully cardiovert given long-standing atrial fibrillation.   - INR 1.6 today   - switch to PO amio today  - Continue heparin gtt  5. OSA/OHS: Currently on [place].  At home, CPAP + oxygen.   6. Diabetes: - Management per primary team.  7. MGUS: Previously followed by hem/onc but has not seen in several years.  Multiple myeloma panel 4/21 with presence of M-spike. 8. Cirrhosis: ?Due to NAFLD.  11/23 Ammonia 48. Paracentesis 2.4 L this admission.  - Continue lactulose.  9. Anemia: Hgb 7.8. No obvious source of bleeding.  Has had IV Fe.   - Transfuse < 8.4.  10. Encephalopathy: Suspect CO2 narcosis + possible component hepatic encephalopathy with elevated NH3.  Alert/oriented this morning.    11. Thrombocytopenia: In setting of acute inflammatory illness and possibly splenic sequestration with cirrhosis. Stable in 110s, plts were low before heparin gtt started.  12. GOC: I will consulted palliative care for goals of care.  Lives in SNF, not ideal long-term HD candidate.  Suspect end stage RV failure. Currently he is electing to be full code.  13. Rt Hand pain: Middle digit MCP joint. H/o gout. Pain feels similar - check uric acid    Lyda Jester, PA-C  7:13 AM  Patient seen with PA, agree with the above note.   Today, co-ox 61% off milrinone.  HR in 80s atrial fibrillation on heparin gtt and amiodarone gtt. SBP 90s.  CVP 16, weight down 10 lbs with CVVH pulling 150 cc/hr net.   Has been out of bed, main complaint is right hand pain that he feels is similar to prior  gout pain.   General: NAD Neck: JVP 14-16 cm, no thyromegaly or thyroid nodule.  Lungs: Clear to auscultation bilaterally with normal respiratory effort. CV: Nondisplaced PMI.  Heart irregular S1/S2, no S3/S4, no murmur.  Trace ankle edema.  Abdomen: Soft, nontender, no hepatosplenomegaly, no distention.  Skin: Intact without lesions or rashes.  Neurologic: Alert and oriented x 3.  Psych: Normal affect. Extremities: No clubbing or cyanosis.  HEENT: Normal.   Still volume overloaded (CVP 16) but improving.  Continue current CVVH, pulling 150 cc/hr net today.  Hopefully can stop CVVH soon but suspect he will need ongoing RRT (anuric currently). SBP 90s, increase midodrine to 10 mg tid.    Stable off milrinone.    Atrial fibrillation is rate-controlled. Continue heparin gtt (mild thrombocytopenia pre-dated heparin).  Stop amiodarone gtt today, start amiodarone 200 mg bid. Eventually transition to Toprol XL when BP stable.   Possible gout pain right hand, sending uric acid and can treat with prednisone if elevated.    Palliative care following, patient wants full code/all interventions.  Lives in SNF.  I suspect he will end up HD-dependent, which will be an issue living in SNF.  Will need ongoing discussions with renal.   CRITICAL CARE Performed by: Loralie Champagne  Total critical care time: 35 minutes  Critical care time was exclusive of separately billable procedures and treating other patients.  Critical care was necessary to treat or prevent imminent or life-threatening deterioration.  Critical care was time spent personally by me on the following activities: development of treatment plan with patient and/or surrogate as well as  nursing, discussions with consultants, evaluation of patient's response to treatment, examination of patient, obtaining history from patient or surrogate, ordering and performing treatments and interventions, ordering and review of laboratory studies, ordering  and review of radiographic studies, pulse oximetry and re-evaluation of patient's condition.   Loralie Champagne 12/01/2020 7:56 AM

## 2020-12-01 NOTE — Progress Notes (Addendum)
[facility] NEPHROLOGY PROGRESS NOTE  Assessment/ Plan:  # Acute kidney injury on chronic kidney disease stage IIIb: Likely hemodynamically mediated in the setting of acute exacerbation of congestive heart failure.  Started CRRT after refractory to diuretics; ultrafiltration goal around 150 cc/h.  Remains anuric therefore continue current CRRT prescription.  All 4K bath. Continue daily lab, strict ins and outs.  No sign of renal recovery therefore he will likely need intermittent HD once volume is stabilized and blood pressure improved.  Discussed with the patient and the cardiology team.  #  Acute exacerbation of diastolic heart failure: With severe pulmonary hypertension and significant third spacing/volume overload.  On CRRT for volume management after refractory to diuretics.  Still volume overload and high CVP.  Cardiology is following.  # Encephalopathy: Likely multifactorial with hypercapnic respiratory failure/CO2 narcosis and azotemia contributing to possible hepatic encephalopathy with slightly elevated ammonia levels.  Mental status seems to be improving.  #  Anemia of chronic illness: He has severe iron deficiency with an iron saturation of 4% and ferritin of 31.  Treated with IV iron and received ESA on 11/27.  Monitor hemoglobin and transfuse as needed.  # Chronic atrial fibrillation: Developed A. fib with RVR managing with amiodarone.    #  Thrombocytopenia: Likely from splenic sequestration in the setting of cirrhosis, no evidence of bleeding diathesis.  #Hypophosphatemia: Replete phosphorus.  It is due to CRRT.  #Hypotension: Increase midodrine to 10 mg 3 times daily.  Monitor BP.  Subjective: Seen and examined in ICU.  No urine output.  No new event.  He reports his breathing is better today.  No chest pain.  No issue with CRRT.  Objective Vital signs in last 24 hours: Vitals:   12/01/20 0610 12/01/20 0615 12/01/20 0655 12/01/20 0700  BP:    91/60  Pulse:   75 80 83  Resp:  (!) 23 20 (!) 24  Temp:      TempSrc:      SpO2: 99% 96% 97% 100%  Weight:      Height:       Weight change: -4.4 kg  Intake/Output Summary (Last 24 hours) at 12/01/2020 0744 Last data filed at 12/01/2020 0700 Gross per 24 hour  Intake 2529.63 ml  Output 6242 ml  Net -3712.37 ml        Labs: Basic Metabolic Panel: Recent Labs  Lab 11/30/20 0320 11/30/20 1510 12/01/20 0335  NA 135 133* 134*  K 4.5 4.2 4.1  CL 102 100 102  CO2 27 27 26   GLUCOSE 117* 193* 140*  BUN 20 17 16   CREATININE 1.81* 1.64* 1.55*  CALCIUM 8.1* 8.1* 8.2*  PHOS 3.2 2.6 2.0*    Liver Function Tests: Recent Labs  Lab 11/30/20 0320 11/30/20 1510 12/01/20 0335  ALBUMIN 2.7* 2.9* 2.7*    No results for input(s): LIPASE, AMYLASE in the last 168 hours. Recent Labs  Lab 11/25/20 0945 11/27/20 1145  AMMONIA 48* 49*    CBC: Recent Labs  Lab 11/27/20 0500 11/27/20 1941 11/28/20 0316 11/29/20 0320 11/30/20 0320 12/01/20 0330  WBC 5.0  --  4.9 4.4 3.9* 3.9*  HGB 8.2*   < > 7.8* 7.7* 7.8* 8.4*  HCT 30.6*   < > 29.4* 28.3* 29.3* 31.0*  MCV 93.0  --  92.2 92.2 93.3 94.2  PLT 98*  --  98* 113* 111* 113*   < > = values in this interval not displayed.    Cardiac Enzymes: No results for  input(s): CKTOTAL, CKMB, CKMBINDEX, TROPONINI in the last 168 hours. CBG: Recent Labs  Lab 11/30/20 0650 11/30/20 1106 11/30/20 1552 11/30/20 2142 12/01/20 0640  GLUCAP 121* 145* 140* 133* 97     Iron Studies: No results for input(s): IRON, TIBC, TRANSFERRIN, FERRITIN in the last 72 hours. Studies/Results: No results found.  Medications: Infusions:   prismasol BGK 4/2.5 500 mL/hr at 12/01/20 0311    prismasol BGK 4/2.5 200 mL/hr at 11/30/20 1738   sodium chloride 5 mL/hr at 12/01/20 0700   amiodarone 30 mg/hr (12/01/20 0700)   heparin 1,800 Units/hr (12/01/20 0700)   prismasol BGK 4/2.5 1,500 mL/hr at 12/01/20 0630    Scheduled Medications:  (feeding supplement)  PROSource Plus  30 mL Oral TID BM   allopurinol  300 mg Oral Daily   atorvastatin  20 mg Oral Daily   B-complex with vitamin C  1 tablet Oral Daily   Chlorhexidine Gluconate Cloth  6 each Topical Daily   darbepoetin (ARANESP) injection - NON-DIALYSIS  100 mcg Subcutaneous Q Sun-1800   insulin aspart  0-5 Units Subcutaneous QHS   insulin aspart  0-9 Units Subcutaneous TID WC   insulin glargine-yfgn  16 Units Subcutaneous QHS   lactulose  20 g Oral BID   mouth rinse  15 mL Mouth Rinse BID   midodrine  5 mg Oral TID WC   pantoprazole  40 mg Oral Daily   polyethylene glycol  17 g Oral Daily   sildenafil  20 mg Oral TID   sodium chloride flush  10-40 mL Intracatheter Q12H   tamsulosin  0.4 mg Oral Daily   Zinc Oxide   Topical BID    have reviewed scheduled and prn medications.  Physical Exam: General:NAD, comfortable Heart:RRR, s1s2 nl Lungs: Coarse and decreased basal breath sound, no wheezing Abdomen:soft, Non-tender, non-distended Extremities: Wrapped with bandages in both legs, edema present. Dialysis Access: temp HD catheter   Krystl Wickware Prasad Cal Gindlesperger 12/01/2020,7:44 AM  LOS: 9 days

## 2020-12-01 NOTE — Plan of Care (Signed)
  Problem: Education: Goal: Knowledge of General Education information will improve Description: Including pain rating scale, medication(s)/side effects and non-pharmacologic comfort measures Outcome: Progressing   Problem: Health Behavior/Discharge Planning: Goal: Ability to manage health-related needs will improve Outcome: Progressing   Problem: Clinical Measurements: Goal: Ability to maintain clinical measurements within normal limits will improve Outcome: Progressing Goal: Will remain free from infection Outcome: Progressing Goal: Respiratory complications will improve Outcome: Progressing Goal: Cardiovascular complication will be avoided Outcome: Progressing   Problem: Activity: Goal: Risk for activity intolerance will decrease Outcome: Progressing   Problem: Nutrition: Goal: Adequate nutrition will be maintained Outcome: Progressing   Problem: Coping: Goal: Level of anxiety will decrease Outcome: Progressing   Problem: Elimination: Goal: Will not experience complications related to bowel motility Outcome: Progressing   Problem: Pain Managment: Goal: General experience of comfort will improve Outcome: Progressing   Problem: Safety: Goal: Ability to remain free from injury will improve Outcome: Progressing

## 2020-12-01 NOTE — Progress Notes (Addendum)
ANTICOAGULATION CONSULT NOTE - Follow Up Consult  Pharmacy Consult for heparin Indication: atrial fibrillation  Labs: Recent Labs    11/29/20 0320 11/29/20 1600 11/29/20 2151 11/30/20 0320 11/30/20 1510 11/30/20 1713 12/01/20 0104  HGB 7.7*  --   --  7.8*  --   --   --   HCT 28.3*  --   --  29.3*  --   --   --   PLT 113*  --   --  111*  --   --   --   LABPROT 26.4*  --   --  19.6*  --   --   --   INR 2.4*  --   --  1.7*  --   --   --   HEPARINUNFRC  --   --   --   --   --  <0.10* <0.10*  CREATININE 1.68*   < > 1.90* 1.81* 1.64*  --   --    < > = values in this interval not displayed.    Assessment: 70yo male subtherapeutic on heparin after rate change; no infusion issues or signs of bleeding per RN.  Goal of Therapy:  Heparin level 0.3-0.5 units/ml   Plan:  Will increase heparin infusion by 3 units/kgABW/hr to 1800 units/hr and check level in 6 hours.    Wynona Neat, PharmD, BCPS  12/01/2020,3:45 AM

## 2020-12-02 DIAGNOSIS — J9601 Acute respiratory failure with hypoxia: Secondary | ICD-10-CM

## 2020-12-02 DIAGNOSIS — I4891 Unspecified atrial fibrillation: Secondary | ICD-10-CM | POA: Diagnosis not present

## 2020-12-02 DIAGNOSIS — J9611 Chronic respiratory failure with hypoxia: Secondary | ICD-10-CM | POA: Diagnosis not present

## 2020-12-02 DIAGNOSIS — I5033 Acute on chronic diastolic (congestive) heart failure: Secondary | ICD-10-CM | POA: Diagnosis not present

## 2020-12-02 DIAGNOSIS — R570 Cardiogenic shock: Secondary | ICD-10-CM | POA: Diagnosis not present

## 2020-12-02 LAB — RENAL FUNCTION PANEL
Albumin: 2.9 g/dL — ABNORMAL LOW (ref 3.5–5.0)
Albumin: 3 g/dL — ABNORMAL LOW (ref 3.5–5.0)
Anion gap: 6 (ref 5–15)
Anion gap: 3 — ABNORMAL LOW (ref 5–15)
BUN: 16 mg/dL (ref 8–23)
BUN: 17 mg/dL (ref 8–23)
CO2: 24 mmol/L (ref 22–32)
CO2: 26 mmol/L (ref 22–32)
Calcium: 8.4 mg/dL — ABNORMAL LOW (ref 8.9–10.3)
Calcium: 8.6 mg/dL — ABNORMAL LOW (ref 8.9–10.3)
Chloride: 102 mmol/L (ref 98–111)
Chloride: 103 mmol/L (ref 98–111)
Creatinine, Ser: 1.36 mg/dL — ABNORMAL HIGH (ref 0.61–1.24)
Creatinine, Ser: 1.41 mg/dL — ABNORMAL HIGH (ref 0.61–1.24)
GFR, Estimated: 57 mL/min — ABNORMAL LOW (ref >60)
GFR, Estimated: 54 mL/min — ABNORMAL LOW (ref >60)
Glucose, Bld: 158 mg/dL — ABNORMAL HIGH (ref 70–99)
Glucose, Bld: 185 mg/dL — ABNORMAL HIGH (ref 70–99)
Phosphorus: 3.2 mg/dL (ref 2.5–4.6)
Phosphorus: 2.6 mg/dL (ref 2.5–4.6)
Potassium: 5 mmol/L (ref 3.5–5.1)
Potassium: 4.7 mmol/L (ref 3.5–5.1)
Sodium: 132 mmol/L — ABNORMAL LOW (ref 135–145)
Sodium: 132 mmol/L — ABNORMAL LOW (ref 135–145)

## 2020-12-02 LAB — GLUCOSE, CAPILLARY
Glucose-Capillary: 148 mg/dL — ABNORMAL HIGH (ref 70–99)
Glucose-Capillary: 165 mg/dL — ABNORMAL HIGH (ref 70–99)
Glucose-Capillary: 178 mg/dL — ABNORMAL HIGH (ref 70–99)
Glucose-Capillary: 179 mg/dL — ABNORMAL HIGH (ref 70–99)

## 2020-12-02 LAB — CBC
HCT: 34.5 % — ABNORMAL LOW (ref 39.0–52.0)
Hemoglobin: 9.3 g/dL — ABNORMAL LOW (ref 13.0–17.0)
MCH: 25.7 pg — ABNORMAL LOW (ref 26.0–34.0)
MCHC: 27 g/dL — ABNORMAL LOW (ref 30.0–36.0)
MCV: 95.3 fL (ref 80.0–100.0)
Platelets: 122 10*3/uL — ABNORMAL LOW (ref 150–400)
RBC: 3.62 MIL/uL — ABNORMAL LOW (ref 4.22–5.81)
RDW: 21.6 % — ABNORMAL HIGH (ref 11.5–15.5)
WBC: 3.6 10*3/uL — ABNORMAL LOW (ref 4.0–10.5)
nRBC: 0 % (ref 0.0–0.2)

## 2020-12-02 LAB — COOXEMETRY PANEL
Carboxyhemoglobin: 1.9 % — ABNORMAL HIGH (ref 0.5–1.5)
Methemoglobin: 1 % (ref 0.0–1.5)
O2 Saturation: 63.3 %
Total hemoglobin: 9.4 g/dL — ABNORMAL LOW (ref 12.0–16.0)

## 2020-12-02 LAB — HEPARIN LEVEL (UNFRACTIONATED): Heparin Unfractionated: 0.44 IU/mL (ref 0.30–0.70)

## 2020-12-02 LAB — PROTIME-INR
INR: 1.4 — ABNORMAL HIGH (ref 0.8–1.2)
Prothrombin Time: 17.1 seconds — ABNORMAL HIGH (ref 11.4–15.2)

## 2020-12-02 LAB — MAGNESIUM: Magnesium: 2.4 mg/dL (ref 1.7–2.4)

## 2020-12-02 NOTE — Progress Notes (Signed)
Physical Therapy Treatment Patient Details Name: William Flores MRN: [id number] DOB: 15-Oct-1950 Today's Date: 12/02/2020   History of Present Illness Pt is a 70 y.o. male admitted from [facility] on 56/21/30 with acute exacerbation of CHF, severe pulmonary hypertension. S/p R paracentesis on 11/22. CRRT initiated 11/25. PMH includes a-fib, HTN, DM, CKD stage III, CAD, HLD, sleep apnea, cardiomyopathy, cirrhosis, acute on chronic respiratory failure (2L O2 baseline), obesity.    PT Comments    Pt tolerates treatment well, able to ambulate during switch of CRRT bags. Pt demonstrates significant imbalance and often drifts to left side during session, requiring PT assist for 2 losses of balance. Pt will benefit from continued aggressive mobilization and acute PT services to improve activity tolerance and reduce falls risk.   Recommendations for follow up therapy are one component of a multi-disciplinary discharge planning process, led by the attending physician.  Recommendations may be updated based on patient status, additional functional criteria and insurance authorization.  Follow Up Recommendations  Skilled nursing-short term rehab (<3 hours/day)     Assistance Recommended at Discharge Frequent or constant Supervision/Assistance  Equipment Recommendations  Rolling walker (2 wheels);BSC/3in1    Recommendations for Other Services       Precautions / Restrictions Precautions Precautions: Fall;Other (comment) Precaution Comments: Watch HR, SpO2 (wears 2L O2 baseline per chart); CRRT Restrictions Weight Bearing Restrictions: No     Mobility  Bed Mobility Overal bed mobility: Needs Assistance Bed Mobility: Supine to Sit;Sit to Supine     Supine to sit: Mod assist;HOB elevated Sit to supine: Min assist        Transfers Overall transfer level: Needs assistance Equipment used:  (EVA walker) Transfers: Sit to/from Stand Sit to Stand: Min assist           General  transfer comment: cues for hand placement    Ambulation/Gait Ambulation/Gait assistance: Min Web designer (Feet): 120 Feet Assistive device:  (EVA walker) Gait Pattern/deviations: Step-through pattern;Staggering left;Drifts right/left Gait velocity: reduced Gait velocity interpretation: <1.8 ft/sec, indicate of risk for recurrent falls   General Gait Details: pt with increased lateral drift, PT providing minA to often prevent leftward path deviation   Stairs             Wheelchair Mobility    Modified Rankin (Stroke Patients Only)       Balance Overall balance assessment: Needs assistance Sitting-balance support: No upper extremity supported;Feet supported Sitting balance-Leahy Scale: Fair     Standing balance support: Reliant on assistive device for balance;Bilateral upper extremity supported Standing balance-Leahy Scale: Poor Standing balance comment: reliant on UE support of walker, minG for static, minA for dynamic                            Cognition Arousal/Alertness: Awake/alert Behavior During Therapy: WFL for tasks assessed/performed Overall Cognitive Status: No family/caregiver present to determine baseline cognitive functioning Area of Impairment: Problem solving;Memory                     Memory: Decreased short-term memory       Problem Solving: Slow processing          Exercises      General Comments General comments (skin integrity, edema, etc.): pt on 4L [place] upon arrival, pt ambulates on 4L [place] during session, SpO2 stable at end of walk      Pertinent Vitals/Pain Pain Assessment: No/denies pain    Home  Living                          Prior Function            PT Goals (current goals can now be found in the care plan section) Acute Rehab PT Goals Patient Stated Goal: Return to SNF for continued rehab Progress towards PT goals: Progressing toward goals    Frequency    Min 2X/week       PT Plan Current plan remains appropriate    Co-evaluation              AM-PAC PT "6 Clicks" Mobility   Outcome Measure  Help needed turning from your back to your side while in a flat bed without using bedrails?: A Little Help needed moving from lying on your back to sitting on the side of a flat bed without using bedrails?: A Little Help needed moving to and from a bed to a chair (including a wheelchair)?: A Lot Help needed standing up from a chair using your arms (e.g., wheelchair or bedside chair)?: A Little Help needed to walk in hospital room?: A Little Help needed climbing 3-5 steps with a railing? : Total 6 Click Score: 15    End of Session Equipment Utilized During Treatment: Oxygen Activity Tolerance: Patient tolerated treatment well Patient left: in bed;with call bell/phone within reach;with bed alarm set;with nursing/sitter in room Nurse Communication: Mobility status PT Visit Diagnosis: Other abnormalities of gait and mobility (R26.89);Muscle weakness (generalized) (M62.81)     Time: 0998-3382 PT Time Calculation (min) (ACUTE ONLY): 24 min  Charges:  $Gait Training: 8-22 mins $Therapeutic Activity: 8-22 mins                     Zenaida Niece, PT, DPT Acute Rehabilitation Pager: [phone] Office [place] 12/02/2020, 5:35 PM

## 2020-12-02 NOTE — Progress Notes (Addendum)
[facility] NEPHROLOGY PROGRESS NOTE  Assessment/ Plan:  # Acute kidney injury on chronic kidney disease stage IIIb: Likely hemodynamically mediated in the setting of acute exacerbation of congestive heart failure.  Started CRRT after refractory to diuretics; ultrafiltration goal around 150 cc/h.  Remains anuric and volume overload (high CVP) therefore continue current CRRT prescription.  All 4K bath. Continue daily lab, strict ins and outs.  No sign of renal recovery therefore he will likely need intermittent HD once volume status and blood pressure improved.  It will be difficult to ultrafiltrate with IHD and hypotension.  Discussed with the patient and the cardiology team.  #  Acute exacerbation of diastolic heart failure: With severe pulmonary hypertension and significant third spacing/volume overload.  On CRRT for volume management after refractory to diuretics.  Still volume overload and high CVP.  Cardiology is following.  # Encephalopathy: Likely multifactorial with hypercapnic respiratory failure/CO2 narcosis and azotemia contributing to possible hepatic encephalopathy with slightly elevated ammonia levels.  Mental status improved..  #  Anemia of chronic illness: He has severe iron deficiency with an iron saturation of 4% and ferritin of 31.  Treated with IV iron and received ESA on 11/27.  Monitor hemoglobin and transfuse as needed.  # Chronic atrial fibrillation: Developed A. fib with RVR managing with amiodarone.    #  Thrombocytopenia: Likely from splenic sequestration in the setting of cirrhosis, no evidence of bleeding diathesis.  #Hypophosphatemia:  It is due to CRRT.  Required a phosphorus repletion.  #Hypotension: Increased midodrine to 10 mg 3 times daily.  Monitor BP.  Subjective: Seen and examined in ICU.  Remains anuric.  Tolerating CRRT with ultrafiltration.  Breathing is stable.  CVP around 15.  Discussed with ICU nurse.  Objective Vital signs in last 24  hours: Vitals:   12/02/20 0601 12/02/20 0700 12/02/20 0800 12/02/20 0835  BP: (!) 103/58 (!) 101/54 108/67   Pulse: 85 77 80   Resp: 20 15 19    Temp:   97.9 F (36.6 C) 97.9 F (36.6 C)  TempSrc:   Oral Oral  SpO2: 95% 99% 98%   Weight:      Height:       Weight change: -3.4 kg  Intake/Output Summary (Last 24 hours) at 12/02/2020 0903 Last data filed at 12/02/2020 0800 Gross per 24 hour  Intake 1354.68 ml  Output 5306 ml  Net -3951.32 ml        Labs: Basic Metabolic Panel: Recent Labs  Lab 12/01/20 0335 12/01/20 2057 12/02/20 0458  NA 134* 132* 132*  K 4.1 4.6 5.0  CL 102 101 102  CO2 26 24 24   GLUCOSE 140* 176* 158*  BUN 16 15 16   CREATININE 1.55* 1.47* 1.36*  CALCIUM 8.2* 8.3* 8.4*  PHOS 2.0* 4.4 3.2    Liver Function Tests: Recent Labs  Lab 12/01/20 0335 12/01/20 2057 12/02/20 0458  ALBUMIN 2.7* 3.1* 2.9*    No results for input(s): LIPASE, AMYLASE in the last 168 hours. Recent Labs  Lab 11/25/20 0945 11/27/20 1145  AMMONIA 48* 49*    CBC: Recent Labs  Lab 11/28/20 0316 11/29/20 0320 11/30/20 0320 12/01/20 0330 12/02/20 0458  WBC 4.9 4.4 3.9* 3.9* 3.6*  HGB 7.8* 7.7* 7.8* 8.4* 9.3*  HCT 29.4* 28.3* 29.3* 31.0* 34.5*  MCV 92.2 92.2 93.3 94.2 95.3  PLT 98* 113* 111* 113* 122*    Cardiac Enzymes: No results for input(s): CKTOTAL, CKMB, CKMBINDEX, TROPONINI in the last 168 hours. CBG: Recent Labs  Lab 12/01/20 0640 12/01/20 1122 12/01/20 1617 12/01/20 2128 12/02/20 0625  GLUCAP 97 140* 124* 164* 148*     Iron Studies: No results for input(s): IRON, TIBC, TRANSFERRIN, FERRITIN in the last 72 hours. Studies/Results: No results found.  Medications: Infusions:   prismasol BGK 4/2.5 500 mL/hr at 12/01/20 2328    prismasol BGK 4/2.5 200 mL/hr at 12/01/20 1645   sodium chloride 5 mL/hr at 12/02/20 0800   heparin 1,900 Units/hr (12/02/20 0800)   prismasol BGK 4/2.5 1,500 mL/hr at 12/02/20 4356    Scheduled Medications:   (feeding supplement) PROSource Plus  30 mL Oral TID BM   allopurinol  300 mg Oral Daily   amiodarone  200 mg Oral BID   atorvastatin  20 mg Oral Daily   B-complex with vitamin C  1 tablet Oral Daily   Chlorhexidine Gluconate Cloth  6 each Topical Daily   darbepoetin (ARANESP) injection - NON-DIALYSIS  100 mcg Subcutaneous Q Sun-1800   insulin aspart  0-5 Units Subcutaneous QHS   insulin aspart  0-9 Units Subcutaneous TID WC   insulin glargine-yfgn  14 Units Subcutaneous QHS   lactulose  20 g Oral BID   mouth rinse  15 mL Mouth Rinse BID   midodrine  10 mg Oral TID WC   pantoprazole  40 mg Oral Daily   polyethylene glycol  17 g Oral Daily   predniSONE  40 mg Oral Q breakfast   Followed by   Derrill Memo ON 12/04/2020] predniSONE  20 mg Oral Q breakfast   sildenafil  20 mg Oral TID   sodium chloride flush  10-40 mL Intracatheter Q12H   tamsulosin  0.4 mg Oral Daily   Zinc Oxide   Topical BID    have reviewed scheduled and prn medications.  Physical Exam: General: Able to lie flat, not in distress Heart:RRR, s1s2 nl Lungs: Coarse and decreased basal breath sound, no wheezing Abdomen:soft, Non-tender, non-distended Extremities: Wrapped with bandages in both legs, edema present. Dialysis Access: temp HD catheter   [place] Soy Tanna Furry 12/02/2020,9:03 AM  LOS: 10 days

## 2020-12-02 NOTE — TOC Progression Note (Signed)
Transition of Care [facility]) - Progression Note    Patient Details  Name: William Flores MRN: [id number] Date of Birth: May 25, 1950  Transition of Care [facility]) CM/SW [place], [place] Phone Number: 12/02/2020, 11:06 AM  Clinical Narrative:    HF CSW spoke with William Flores at bedside and William Flores reported that he would like to go to another facility other than [facility] and that his daughter will gather his belongings from [place]. William Flores gave permission for the CSW to send out clinicals to other SNF's for bed offers. CSW also spoke with the patient and completed a very brief SDOH with the patient who denied having any needs at this time. Patient reported he does have a PCP and the facility/SNF help him get his medications. CSW provided the patient with the social workers name and position and if anything changes to please reach out so that the CSW can provide support.  CSW will continue to follow throughout discharge.   Expected Discharge Plan: [place] Barriers to Discharge: Continued Medical Work up  Expected Discharge Plan and Services Expected Discharge Plan: [place] In-house Referral: Clinical Social Work Discharge Planning Services: CM Consult   Living arrangements for the past 2 months: [place]                                       Social Determinants of Health (SDOH) Interventions Food Insecurity Interventions: Intervention Not Indicated Financial Strain Interventions: Intervention Not Indicated Housing Interventions: Intervention Not Indicated, Other (Comment) (Patient is from SNF, [facility]) Transportation Interventions: Intervention Not Indicated (Pt reports the SNF has been getting him to appointments)  Readmission Risk Interventions Readmission Risk Prevention Plan 11/23/2020 10/24/2020 10/15/2020  Transportation Screening Complete Complete Complete  PCP or Specialist Appt  within 3-5 Days - - -  [place] or [place] - - Complete  Social Work Consult for [place] Planning/Counseling - - Complete  Palliative Care Screening - - Not Applicable  Medication Review Press photographer) Complete Complete Complete  PCP or Specialist appointment within 3-5 days of discharge Complete (No Data) -  [place] or Home Care Consult Complete Complete -  SW Recovery Care/Counseling Consult Complete Complete -  Palliative Care Screening - Not Applicable -  [place] Complete Complete -  Some recent data might be hidden   William Flores, MSW, LCSWA [phone] Heart Failure Social Worker

## 2020-12-02 NOTE — Progress Notes (Signed)
[place] for warfarin > heparin while on CRRT Indication: atrial fibrillation  Allergies  Allergen Reactions   Codeine     Headache     Patient Measurements: Height: 5' 7"  (170.2 cm) Weight: 114.8 kg (253 lb 1.4 oz) IBW/kg (Calculated) : 66.1  Vital Signs: Temp: 98 F (36.7 C) (11/30 1200) Temp Source: Oral (11/30 1200) BP: 113/62 (11/30 1200) Pulse Rate: 91 (11/30 1200)  Labs: Recent Labs    11/30/20 0320 11/30/20 1510 12/01/20 0330 12/01/20 0335 12/01/20 1116 12/01/20 2057 12/02/20 0458  HGB 7.8*  --  8.4*  --   --   --  9.3*  HCT 29.3*  --  31.0*  --   --   --  34.5*  PLT 111*  --  113*  --   --   --  122*  LABPROT 19.6*  --  19.0*  --   --   --  17.1*  INR 1.7*  --  1.6*  --   --   --  1.4*  HEPARINUNFRC  --    < >  --   --  0.26* 0.38 0.44  CREATININE 1.81*   < >  --  1.55*  --  1.47* 1.36*   < > = values in this interval not displayed.     Estimated Creatinine Clearance: 62.9 mL/min (A) (by C-G formula based on SCr of 1.36 mg/dL (H)).   Medical History: Past Medical History:  Diagnosis Date   CAD (coronary artery disease)    LHC 2/08:  mRCA 50%, o/w no sig CAD, EF 25%   Cardiomyopathy ([place])    EF 35-40% in 2/16 in setting of AF with RVR >> echo 5/16 with improved LVF with EF 65-70%   Chronic atrial fibrillation (HCC)    Chronic diastolic heart failure (HCC)    HFPF - EF ~65-70% (OFTEN EXACERBATED BY AFIB)   CKD (chronic kidney disease)    hx of worsening renal failure and hyperK+ in setting of acute diast CHF >> required CVVHD in 4/16 and dialysis x 1 in 5/16   CKD stage 3 due to type 2 diabetes mellitus ([place]) 09/17/2015   Colon cancer ([place])    Colon polyps 09/21/2012   Tubular adenoma   Diabetes ([place])    Hyperlipidemia    Hypertension    Obesity hypoventilation syndrome ([place])    Renal insufficiency    Sleep apnea     Medications:    prismasol BGK 4/2.5 500 mL/hr at 12/02/20 0951    prismasol BGK  4/2.5 200 mL/hr at 12/01/20 1645   sodium chloride 5 mL/hr at 12/02/20 1200   heparin 1,800 Units/hr (12/02/20 1200)   prismasol BGK 4/2.5 1,500 mL/hr at 12/02/20 7017     Assessment: Pharmacy consulted to dose warfarin in patient with atrial fibrillation.  INR on admission is 2.7.  Patient is an unreliable historian.  Home dose listed as 7.5 mg daily per med rec.    Patient recently admitted here in October for GI bleed and per GI recommended INR 2-2.5.  INR down to 1.4 today after holding since 11/23.  No overt bleeding or complications noted currently.  Heparin drip 1900 uts/hr heparin level 0.44 at goal but up trending. CBC stable  No bleeding issues noted - but recent GIB   Goal of Therapy:  Heparin Level goal 0.3-0.5 INR 2-2.5 Monitor platelets by anticoagulation protocol: Yes   Plan:  Decrease IV heparin drip rate slightly 1800 units/hr. Daily  heparin level and CBC.  Bonnita Nasuti Pharm.D. CPP, BCPS Clinical Pharmacist [phone] 12/02/2020 1:04 PM

## 2020-12-02 NOTE — Progress Notes (Signed)
Orthopedic Tech Progress Note Patient Details:  RHYLAN KAGEL Sep 27, 1950 [id number]  Ortho Devices Type of Ortho Device: Haematologist Ortho Device/Splint Location: BLE Ortho Device/Splint Interventions: Ordered, Adjustment, Application   Post Interventions Patient Tolerated: Well Instructions Provided: Care of [place] 12/02/2020, 5:03 PM

## 2020-12-02 NOTE — Progress Notes (Signed)
Patient ID: William Flores, male   DOB: 1950/08/06, 70 y.o.   MRN: [id number]    Advanced Heart Failure Rounding Note   Subjective:    Off milrinone, co-ox 63%.  CVP 15 today.  Weight down 7 lbs with CVVH, pulling 150 cc/hr net UF.   SBP 90s-100s on midodrine 10 tid.    Remains in Afib, rate controlled on po amiodarone.    Right hand pain improved with prednisone for gout.     Objective:   Weight Range:  Vital Signs:   Temp:  [97.4 F (36.3 C)-97.9 F (36.6 C)] 97.7 F (36.5 C) (11/30 0400) Pulse Rate:  [72-92] 77 (11/30 0700) Resp:  [11-23] 15 (11/30 0700) BP: (88-108)/(53-68) 101/54 (11/30 0700) SpO2:  [92 %-100 %] 99 % (11/30 0700) FiO2 (%):  [40 %] 40 % (11/30 0400) Weight:  [114.8 kg] 114.8 kg (11/30 0600) Last BM Date: 12/01/20  Weight change: Filed Weights   11/30/20 0326 12/01/20 0501 12/02/20 0600  Weight: 122.6 kg 118.2 kg 114.8 kg    Intake/Output:   Intake/Output Summary (Last 24 hours) at 12/02/2020 0804 Last data filed at 12/02/2020 0700 Gross per 24 hour  Intake 1580.7 ml  Output 5358 ml  Net -3777.3 ml    PHYSICAL EXAM: CVP 15 General: NAD Neck: JVP 14 cm, no thyromegaly or thyroid nodule.  Lungs: Clear to auscultation bilaterally with normal respiratory effort. CV: Nondisplaced PMI.  Heart regular S1/S2, no S3/S4, no murmur.  Trace ankle edema.  Abdomen: Soft, nontender, no hepatosplenomegaly, no distention.  Skin: Intact without lesions or rashes.  Neurologic: Alert and oriented x 3.  Psych: Normal affect. Extremities: No clubbing or cyanosis.  HEENT: Normal.   Telemetry: Afib 80s (personally reviewed)  Labs: Basic Metabolic Panel: Recent Labs  Lab 11/28/20 0316 11/28/20 1600 11/29/20 0320 11/29/20 1600 11/30/20 0320 11/30/20 1510 12/01/20 0330 12/01/20 0335 12/01/20 2057 12/02/20 0458  NA 131*   < > 135   < > 135 133*  --  134* 132* 132*  K 4.2   < > 4.2   < > 4.5 4.2  --  4.1 4.6 5.0  CL 97*   < > 103   < > 102 100  --   102 101 102  CO2 29   < > 29   < > 27 27  --  26 24 24   GLUCOSE 249*   < > 120*   < > 117* 193*  --  140* 176* 158*  BUN 45*   < > 26*   < > 20 17  --  16 15 16   CREATININE 2.00*   < > 1.68*   < > 1.81* 1.64*  --  1.55* 1.47* 1.36*  CALCIUM 7.8*   < > 8.0*   < > 8.1* 8.1*  --  8.2* 8.3* 8.4*  MG 2.4  --  2.4  --  2.4  --  2.3  --   --  2.4  PHOS 3.4   < > 2.2*   < > 3.2 2.6  --  2.0* 4.4 3.2   < > = values in this interval not displayed.    Liver Function Tests: Recent Labs  Lab 11/30/20 0320 11/30/20 1510 12/01/20 0335 12/01/20 2057 12/02/20 0458  ALBUMIN 2.7* 2.9* 2.7* 3.1* 2.9*   No results for input(s): LIPASE, AMYLASE in the last 168 hours. Recent Labs  Lab 11/25/20 0945 11/27/20 1145  AMMONIA 48* 49*    CBC: Recent Labs  Lab 11/28/20 0316 11/29/20 0320 11/30/20 0320 12/01/20 0330 12/02/20 0458  WBC 4.9 4.4 3.9* 3.9* 3.6*  HGB 7.8* 7.7* 7.8* 8.4* 9.3*  HCT 29.4* 28.3* 29.3* 31.0* 34.5*  MCV 92.2 92.2 93.3 94.2 95.3  PLT 98* 113* 111* 113* 122*    Cardiac Enzymes: No results for input(s): CKTOTAL, CKMB, CKMBINDEX, TROPONINI in the last 168 hours.  BNP: BNP (last 3 results) Recent Labs    11/20/20 1128 11/22/20 0425 12/01/20 0330  BNP 306.1* 270.0* 207.3*    ProBNP (last 3 results) No results for input(s): PROBNP in the last 8760 hours.    Other results:  Imaging: No results found.   Medications:     Scheduled Medications:  (feeding supplement) PROSource Plus  30 mL Oral TID BM   allopurinol  300 mg Oral Daily   amiodarone  200 mg Oral BID   atorvastatin  20 mg Oral Daily   B-complex with vitamin C  1 tablet Oral Daily   Chlorhexidine Gluconate Cloth  6 each Topical Daily   darbepoetin (ARANESP) injection - NON-DIALYSIS  100 mcg Subcutaneous Q Sun-1800   insulin aspart  0-5 Units Subcutaneous QHS   insulin aspart  0-9 Units Subcutaneous TID WC   insulin glargine-yfgn  14 Units Subcutaneous QHS   lactulose  20 g Oral BID   mouth  rinse  15 mL Mouth Rinse BID   midodrine  10 mg Oral TID WC   pantoprazole  40 mg Oral Daily   polyethylene glycol  17 g Oral Daily   predniSONE  40 mg Oral Q breakfast   Followed by   Derrill Memo ON 12/04/2020] predniSONE  20 mg Oral Q breakfast   sildenafil  20 mg Oral TID   sodium chloride flush  10-40 mL Intracatheter Q12H   tamsulosin  0.4 mg Oral Daily   Zinc Oxide   Topical BID    Infusions:   prismasol BGK 4/2.5 500 mL/hr at 12/01/20 2328    prismasol BGK 4/2.5 200 mL/hr at 12/01/20 1645   sodium chloride 5 mL/hr at 12/02/20 0700   heparin 1,900 Units/hr (12/02/20 0700)   prismasol BGK 4/2.5 1,500 mL/hr at 12/02/20 0621    PRN Medications: sodium chloride, acetaminophen, alum & mag hydroxide-simeth, guaiFENesin-dextromethorphan, heparin, heparin, hydrocortisone cream, hydrOXYzine, lip balm, melatonin, oxyCODONE, sodium chloride flush   Assessment/Plan:    1. Cor pulmonale/RV failure: Suspect primarily due to OHS/OSA. Echo in 4/21 with EF 55-60%, RV severely dilated w/ severe RV dysfunction in the setting of severe pulmonary hypertension w/ PASP 76 mmHg. [place] 4/21 with elevated filling pressures, mixed pulmonary venous/pulmonary hypertension=> diuresed w/ IV Lasix in hospital and placed on po torsemide. Echo in 11/22 showed EF 55-60%, mod LVH, D-shaped septum, severe RV enlargement with severely decreased RV systolic function, IVC dilated. NYHA class IV. Now off milrinone and NE. Co-ox 63%. On CVVH, currently pulling 150/hr.  CVP still 15.   - Continue midodrine 10 mg tid for BP support  - Continue CVVH with UF net 150 cc/hr today, still have fluid to pull off.  Eventually will need to try to transition to [facility].   - c/w unna boots  2. Pulmonary HTN: PASP elevated at 76 mmHg by 4/21 echo estimate with RV systolic function severely reduced. Most likely poorly controlled OHS/OSA as cause (group 3 PH).  V/Q scan negative for chronic PE.  Mixed PVH/PAH on RHC.  Echo in 10/22 with severe RV  failure and again this admission.   - Continue  sildenafil 20 mg tid.  - Continue home oxygen and CPAP at night.    3. AKI on Stage IIIb CKD: Now on CVVH and anuric.  I think he would be a difficult long-term HD candidate (lives in SNF) but looks like he is going to be HD-dependent.   4. Atrial fibrillation: Chronic, unlikely to be able to successfully cardiovert given long-standing atrial fibrillation.   - Continue po amiodarone for rate control for now with soft BP, eventually back to Toprol XL.  - Continue heparin gtt while on CVVH.   5. OSA/OHS: Currently on [place].  At home, CPAP + oxygen.   6. Diabetes: SSI 7. MGUS: Previously followed by hem/onc but has not seen in several years.  Multiple myeloma panel 4/21 with presence of M-spike. 8. Cirrhosis: ?Due to NAFLD.  11/23 Ammonia 48. Paracentesis 2.4 L this admission. Mental status improved.  - Continue lactulose.  9. Anemia: Hgb 9.3. No obvious source of bleeding.  Has had IV Fe.   10. Encephalopathy: Suspect CO2 narcosis + possible component hepatic encephalopathy with elevated NH3.  Alert/oriented this morning, overall improved.    - He remains on lactulose.  11. Thrombocytopenia: In setting of acute inflammatory illness and possibly splenic sequestration with cirrhosis. Stable in 110s-120s, plts were low before heparin gtt started.  12. GOC: I will consulted palliative care for goals of care.  Lives in SNF, not ideal long-term HD candidate.  Suspect end stage RV failure. Currently he is electing to be full code.  13. Rt Hand pain: Middle digit MCP joint. H/o gout. Pain feels similar.  Now getting prednisone burst, feels better.    Palliative care following, patient wants full code/all interventions.  Lives in SNF.  I suspect he will end up HD-dependent, which will be an issue living in SNF.  Will need ongoing discussions with renal.   CRITICAL CARE Performed by: Loralie Champagne  Total critical care time: 35 minutes  Critical care time was  exclusive of separately billable procedures and treating other patients.  Critical care was necessary to treat or prevent imminent or life-threatening deterioration.  Critical care was time spent personally by me on the following activities: development of treatment plan with patient and/or surrogate as well as nursing, discussions with consultants, evaluation of patient's response to treatment, examination of patient, obtaining history from patient or surrogate, ordering and performing treatments and interventions, ordering and review of laboratory studies, ordering and review of radiographic studies, pulse oximetry and re-evaluation of patient's condition.   Loralie Champagne 12/02/2020 8:04 AM

## 2020-12-02 NOTE — Progress Notes (Signed)
Assisted pt onto bedpan, small, loose BM, pt states he feels like he voided, (pt has been anuric and on CRRT) yellow noted on bedpad, looks like urine. Cleaned and repositioned.

## 2020-12-02 NOTE — Progress Notes (Addendum)
NAME:  William Flores, MRN:  [id number], DOB:  Jun 04, 1950, LOS: 69 ADMISSION DATE:  11/22/2020, CONSULTATION DATE: 11/27/2020 REFERRING MD: Triad, CHIEF COMPLAINT: Volume overload renal failure  History of Present Illness:  Mr. Zhong is a 70 year old nursing home resident with acute on chronic right heart failure cor pulmonale severe pulmonary hypertension, metabolic encephalopathy, acute on chronic anemia chronic with acute respiratory failure, cirrhosis secondary to Karlene Lineman, chronic atrial fibrillation acute kidney injury, thrombocytopenia diabetes mellitus obstructive sleep apnea who presents with worsening volume overload and pulmonary critical care is asked to transfer to intensive care place a hemodialysis catheter so that he can undergo CRRT.  Pertinent  Medical History   Past Medical History:  Diagnosis Date   CAD (coronary artery disease)    LHC 2/08:  mRCA 50%, o/w no sig CAD, EF 25%   Cardiomyopathy ([place])    EF 35-40% in 2/16 in setting of AF with RVR >> echo 5/16 with improved LVF with EF 65-70%   Chronic atrial fibrillation (HCC)    Chronic diastolic heart failure (HCC)    HFPF - EF ~65-70% (OFTEN EXACERBATED BY AFIB)   CKD (chronic kidney disease)    hx of worsening renal failure and hyperK+ in setting of acute diast CHF >> required CVVHD in 4/16 and dialysis x 1 in 5/16   CKD stage 3 due to type 2 diabetes mellitus ([place]) 09/17/2015   Colon cancer ([place])    Colon polyps 09/21/2012   Tubular adenoma   Diabetes ([place])    Hyperlipidemia    Hypertension    Obesity hypoventilation syndrome ([place])    Renal insufficiency    Sleep apnea     Significant Hospital Events: Including procedures, antibiotic start and stop dates in addition to other pertinent events   11/27/2020 consult to pulmonary critical care 11/27/2020 transfer to intensive care unit place hemodialysis catheter 11/28 was cleared by SLP fro regular diet.  11/29 midodrine increased 11/30 off milrinone, and NE.  Pulling UF -150 and tolerating. Up in chair and not on oxygen. Mental status intact.   Interim History / Subjective:  Right hand pain better after adding back allopurinol   Objective   Blood pressure 101/60, pulse 73, temperature 97.9 F (36.6 C), temperature source Oral, resp. rate 14, height 5' 7"  (1.702 m), weight 114.8 kg, SpO2 96 %. CVP:  [6 mmHg-15 mmHg] 14 mmHg  FiO2 (%):  [40 %] 40 %   Intake/Output Summary (Last 24 hours) at 12/02/2020 1105 Last data filed at 12/02/2020 1000 Gross per 24 hour  Intake 1230.42 ml  Output 4526 ml  Net -3295.58 ml   Filed Weights   11/30/20 0326 12/01/20 0501 12/02/20 0600  Weight: 122.6 kg 118.2 kg 114.8 kg    Examination: General this is a 70 year old white male. He is not in distress.  HENT NCAT right IJ CVL in good position w/out evidence of infection  Pulm dec bases. Now on room air. No accessory use.  Card rrr w/ audible systolic heart murmur  Ext warm. Pulses are strong LE wrapped Abd soft  Neuro intact Gu anuric   Resolved Hospital Problem list     Assessment & Plan:  Acute on chronic systolic right heart failure with cor pulmonale and severe pulmonary hypertension with hypervolemia. Cardiogenic shock; coox lower off milrinone. Plan Cont volume removal w/ CRRT Tele  Cont midodrine 65m tid Cont sildenafil tid  Avoid hypoxia Not candidate for GOMT given hypotension Cont una boots  Acute on chronic  hypoxic/hypercapnic respiratory failure OSA Likely OHS Plan BIPAP whenever sleeping Compliance will be biggest issue Supplemental oxygen for sats > 90  Volume removal   Chronic A. Fib with rapid ventricular response, now rate controlled Plan Cont oral amio Heparin gtt Tele   AKI on CKD stage IIIb, Anuric BPH -still volume overloaded. Tolerating UF goal of -150; nephro concerned that he will not make renal recovery  Plan Cont renal dose meds CRRT Consider transition to attempt iHD once euvolemic but concern  is that he won't tolerate this hemodynamically.  Dc tamsulosin as anuric   Cirrhosis with elevated ammonia level status post paracentesis with 2.4 L removed Pruritis Plan Cont to remove volume w/ CRRT Lactulose  Atarax tid PRN   Anemia of chronic disease Plan Cont to trend cbc Transfusion trigger per heme Aranesp per nephro   Chronic thrombocytopenia, likely 2/2 liver failure->stable, actually improved a little  Plan Cont to monitor   Diabetes mellitus Currently glycemic control w/in goal  Plan Cont ssi goal 140-180 Cont Semglee 14 units daily  Acute metabolic encephalopathy-->resolved but at risk for recurrence  Plan Cont supportive care Avoid sedating meds BIPAP at HS Delirium interventions (mobilize as able, lights out at night etc)   Morbid obesity Plan Needs long term wt loss   GERD Plan PPI  Hand pain-- concern for gout but Uric acid <1.5 Plan Supportive care  Getting allopurinol  GoC -Previous discussions that he wants everything done. Moving forward, this will depend on his ability to tolerate intermittent HD most likely.  Plan  On-going assessment   Best Practice (right click and "Reselect all SmartList Selections" daily)   Diet/type: Regular consistency (see orders)  DVT prophylaxis: heparin GI prophylaxis: PPI Lines: Dialysis Catheter Foley:  N/A Code Status:  full code Last date of multidisciplinary goals of care discussion. Continue palliative involvement pending trajectory.   My cct 30 min  Erick Colace ACNP-BC [place] Pager # [phone] OR # [phone] if no answer  Attending attestation:  Mr. Hoogendoorn is a 70 y/o gentleman with a history of CKD, deconditioning and poor mobility requiring that he live in ALF, DM, OSA, PH who presented with acute decompensated right heart failure due to chronic PH. He developed progressively worsening renal failure requiring CRRT. Still on CRRT. Hand is feeling better  today.  BP 113/62 (BP Location: Left Arm)   Pulse 91   Temp 98 F (36.7 C) (Oral)   Resp (!) 22   Ht 5' 7"  (1.702 m)   Wt 114.8 kg   SpO2 95%   BMI 39.64 kg/m  Chronically ill appearing man sitting up in recliner in NAD [place]/AT, eyes anicteric RIJ HD catheter without erythema S1S2, irreg rhythm Breathing comfortably on , CTAB Abd obese, soft, NT Legs wrapped, less leg edema. No cyanosis. Stasis dermatitis changes BL shins. No rashes.  Coox 63% Na+ 132 WBC 3.6 H.H 9.3/24.5  Assessment & plan: Acute on chronic R heart failure due to cor pulmonale -con't sildenafil -stable off milrinone -con't midodrine 42m TID -con't CVP monitoring -Con't CRRT to optimize volume -treat OSA aggressively   Afib with RVR -amiodarone; switched to enteral 11/30  Acute respiratory failure with hypoxia due to [place] likely pulm edema -pulmonary hygiene, OOB mobility -wean O2 as able to maintain spO2 >90%  AKI; no signs of renal recovery -con't CRRT for goal net negative volume status -strict I/Os -No signs of renal recovery, so high risk for chronic dialysis dependence. His success will depend  on his ability to tolerate iHD. Con't midodrine.  Pancytopenia-- leukopenia and thrombocytopenia likely 2/2 NASH cirrhosis -monitor; no need for transfusions  Acute on chronic anemia -transfuse for Hb <7 or hemodynamically significant bleeding -monitor  Hyponatremia likely 2/2 hypervolemia 2/2 heart failure, cirrhosis, and renal disease -correct with dialysate  Hand pain, unlikely gout with undetectable uric acid -tylenol PRN -agree with heat compressees  This patient is critically ill with multiple organ system failure which requires frequent high complexity decision making, assessment, support, evaluation, and titration of therapies. This was completed through the application of advanced monitoring technologies and extensive interpretation of multiple databases. During this encounter critical  care time was devoted to patient care services described in this note for 35 minutes.  Julian Hy, DO 12/02/20 1:30 PM [facility]

## 2020-12-03 DIAGNOSIS — I50811 Acute right heart failure: Secondary | ICD-10-CM | POA: Diagnosis not present

## 2020-12-03 DIAGNOSIS — I482 Chronic atrial fibrillation, unspecified: Secondary | ICD-10-CM

## 2020-12-03 DIAGNOSIS — I50810 Right heart failure, unspecified: Secondary | ICD-10-CM | POA: Diagnosis not present

## 2020-12-03 DIAGNOSIS — I4891 Unspecified atrial fibrillation: Secondary | ICD-10-CM | POA: Diagnosis not present

## 2020-12-03 DIAGNOSIS — I5033 Acute on chronic diastolic (congestive) heart failure: Secondary | ICD-10-CM | POA: Diagnosis not present

## 2020-12-03 DIAGNOSIS — I272 Pulmonary hypertension, unspecified: Secondary | ICD-10-CM | POA: Diagnosis not present

## 2020-12-03 DIAGNOSIS — N179 Acute kidney failure, unspecified: Secondary | ICD-10-CM | POA: Diagnosis not present

## 2020-12-03 DIAGNOSIS — R570 Cardiogenic shock: Secondary | ICD-10-CM | POA: Diagnosis not present

## 2020-12-03 LAB — RENAL FUNCTION PANEL
Albumin: 3 g/dL — ABNORMAL LOW (ref 3.5–5.0)
Albumin: 3.3 g/dL — ABNORMAL LOW (ref 3.5–5.0)
Anion gap: 5 (ref 5–15)
Anion gap: 6 (ref 5–15)
BUN: 22 mg/dL (ref 8–23)
BUN: 26 mg/dL — ABNORMAL HIGH (ref 8–23)
CO2: 27 mmol/L (ref 22–32)
CO2: 23 mmol/L (ref 22–32)
Calcium: 8.6 mg/dL — ABNORMAL LOW (ref 8.9–10.3)
Calcium: 8.3 mg/dL — ABNORMAL LOW (ref 8.9–10.3)
Chloride: 100 mmol/L (ref 98–111)
Chloride: 103 mmol/L (ref 98–111)
Creatinine, Ser: 1.95 mg/dL — ABNORMAL HIGH (ref 0.61–1.24)
Creatinine, Ser: 1.81 mg/dL — ABNORMAL HIGH (ref 0.61–1.24)
GFR, Estimated: 37 mL/min — ABNORMAL LOW (ref >60)
GFR, Estimated: 40 mL/min — ABNORMAL LOW (ref >60)
Glucose, Bld: 144 mg/dL — ABNORMAL HIGH (ref 70–99)
Glucose, Bld: 198 mg/dL — ABNORMAL HIGH (ref 70–99)
Phosphorus: 2.1 mg/dL — ABNORMAL LOW (ref 2.5–4.6)
Phosphorus: 2.6 mg/dL (ref 2.5–4.6)
Potassium: 4.4 mmol/L (ref 3.5–5.1)
Potassium: 4.4 mmol/L (ref 3.5–5.1)
Sodium: 132 mmol/L — ABNORMAL LOW (ref 135–145)
Sodium: 132 mmol/L — ABNORMAL LOW (ref 135–145)

## 2020-12-03 LAB — COOXEMETRY PANEL
Carboxyhemoglobin: 1.4 % (ref 0.5–1.5)
Carboxyhemoglobin: 1.4 % (ref 0.5–1.5)
Carboxyhemoglobin: 1.4 % (ref 0.5–1.5)
Methemoglobin: 0.7 % (ref 0.0–1.5)
Methemoglobin: 0.9 % (ref 0.0–1.5)
Methemoglobin: 0.7 % (ref 0.0–1.5)
O2 Saturation: 44.7 %
O2 Saturation: 44.9 %
O2 Saturation: 46.4 %
Total hemoglobin: 9.2 g/dL — ABNORMAL LOW (ref 12.0–16.0)
Total hemoglobin: 10 g/dL — ABNORMAL LOW (ref 12.0–16.0)
Total hemoglobin: 9.6 g/dL — ABNORMAL LOW (ref 12.0–16.0)

## 2020-12-03 LAB — PROTIME-INR
INR: 1.4 — ABNORMAL HIGH (ref 0.8–1.2)
Prothrombin Time: 16.8 seconds — ABNORMAL HIGH (ref 11.4–15.2)

## 2020-12-03 LAB — GLUCOSE, CAPILLARY
Glucose-Capillary: 137 mg/dL — ABNORMAL HIGH (ref 70–99)
Glucose-Capillary: 210 mg/dL — ABNORMAL HIGH (ref 70–99)
Glucose-Capillary: 303 mg/dL — ABNORMAL HIGH (ref 70–99)
Glucose-Capillary: 201 mg/dL — ABNORMAL HIGH (ref 70–99)

## 2020-12-03 LAB — BASIC METABOLIC PANEL
Anion gap: 6 (ref 5–15)
BUN: 22 mg/dL (ref 8–23)
CO2: 24 mmol/L (ref 22–32)
Calcium: 8.2 mg/dL — ABNORMAL LOW (ref 8.9–10.3)
Chloride: 102 mmol/L (ref 98–111)
Creatinine, Ser: 1.82 mg/dL — ABNORMAL HIGH (ref 0.61–1.24)
GFR, Estimated: 40 mL/min — ABNORMAL LOW (ref >60)
Glucose, Bld: 180 mg/dL — ABNORMAL HIGH (ref 70–99)
Potassium: 4.4 mmol/L (ref 3.5–5.1)
Sodium: 132 mmol/L — ABNORMAL LOW (ref 135–145)

## 2020-12-03 LAB — CBC
HCT: 35.1 % — ABNORMAL LOW (ref 39.0–52.0)
Hemoglobin: 9.4 g/dL — ABNORMAL LOW (ref 13.0–17.0)
MCH: 25.8 pg — ABNORMAL LOW (ref 26.0–34.0)
MCHC: 26.8 g/dL — ABNORMAL LOW (ref 30.0–36.0)
MCV: 96.2 fL (ref 80.0–100.0)
Platelets: 136 10*3/uL — ABNORMAL LOW (ref 150–400)
RBC: 3.65 MIL/uL — ABNORMAL LOW (ref 4.22–5.81)
RDW: 22.3 % — ABNORMAL HIGH (ref 11.5–15.5)
WBC: 6.7 10*3/uL (ref 4.0–10.5)
nRBC: 0.5 % — ABNORMAL HIGH (ref 0.0–0.2)

## 2020-12-03 LAB — MAGNESIUM
Magnesium: 2.4 mg/dL (ref 1.7–2.4)
Magnesium: 2.4 mg/dL (ref 1.7–2.4)

## 2020-12-03 LAB — HEPARIN LEVEL (UNFRACTIONATED): Heparin Unfractionated: 0.26 IU/mL — ABNORMAL LOW (ref 0.30–0.70)

## 2020-12-03 MED ORDER — MUSCLE RUB 10-15 % EX CREA
TOPICAL_CREAM | CUTANEOUS | Status: DC | PRN
  Administered 2020-12-05: 1 via TOPICAL
  Filled 2020-12-03: qty 85

## 2020-12-03 MED ORDER — SODIUM PHOSPHATES 45 MMOLE/15ML IV SOLN
30.0000 mmol | Freq: Once | INTRAVENOUS | Status: AC
  Administered 2020-12-03: 30 mmol via INTRAVENOUS
  Filled 2020-12-03: qty 10

## 2020-12-03 MED ORDER — INSULIN GLARGINE-YFGN 100 UNIT/ML ~~LOC~~ SOLN
16.0000 [IU] | Freq: Every day | SUBCUTANEOUS | Status: DC
  Administered 2020-12-03 – 2020-12-11 (×9): 16 [IU] via SUBCUTANEOUS
  Filled 2020-12-03 (×10): qty 0.16

## 2020-12-03 NOTE — Progress Notes (Signed)
Occupational Therapy Treatment Patient Details Name: William Flores MRN: [id number] DOB: 1950/07/12 Today's Date: 12/03/2020   History of present illness Pt is a 70 y.o. male admitted from [facility] on 40/81/44 with acute exacerbation of CHF, severe pulmonary hypertension. S/p R paracentesis on 11/22. CRRT initiated 11/25. PMH includes a-fib, HTN, DM, CKD stage III, CAD, HLD, sleep apnea, cardiomyopathy, cirrhosis, acute on chronic respiratory failure (2L O2 baseline), obesity.   OT comments  Pt progressing gradually towards OT goals. Session focused on bathing tasks, transfer OOB and initiation of light resistance UE exercises (cleared with MD/NP). Pt with improving transfer abilities with Min A at most needed. However, pt with increased difficulty with ADLs due to new pain in R dominant UE, impairing functional grasp. Pt able to complete 1/3 UE exercises without increased R hand pain. Provided HEP handout with pt to attempt as R hand pain subsides. Educated to avoid shoulder flexion above 90* due to IJ cath. Continue to rec SNF rehab.  SpO2 98-100% on RA, HR WFL BP post-activity 100/60   Recommendations for follow up therapy are one component of a multi-disciplinary discharge planning process, led by the attending physician.  Recommendations may be updated based on patient status, additional functional criteria and insurance authorization.    Follow Up Recommendations  Skilled nursing-short term rehab (<3 hours/day)    Assistance Recommended at Discharge Frequent or constant Supervision/Assistance  Equipment Recommendations  Other (comment) (defer to next venue)    Recommendations for Other Services      Precautions / Restrictions Precautions Precautions: Fall;Other (comment) Precaution Comments: Watch HR, SpO2 (wears 2L O2 baseline per chart); CRRT Restrictions Weight Bearing Restrictions: No       Mobility Bed Mobility Overal bed mobility: Needs Assistance Bed Mobility:  Supine to Sit     Supine to sit: Min guard;HOB elevated     General bed mobility comments: for safety with lines, able to use bedrails to scoot self and bring LEs off of bed    Transfers Overall transfer level: Needs assistance Equipment used: 1 person hand held assist;2 person hand held assist Transfers: Sit to/from Stand;Bed to chair/wheelchair/BSC Sit to Stand: Min guard   Step pivot transfers: Min assist       General transfer comment: min guard with handheld assist to stand (RN present to assist with CRRT lines), pt able to step 180* to recliner chair with light Min A for stability via handheld assist     Balance Overall balance assessment: Needs assistance Sitting-balance support: No upper extremity supported;Feet supported Sitting balance-Leahy Scale: Fair     Standing balance support: Single extremity supported;During functional activity Standing balance-Leahy Scale: Fair Standing balance comment: fair static standing, use of UE support for dynamic tasks                           ADL either performed or assessed with clinical judgement   ADL Overall ADL's : Needs assistance/impaired     Grooming: Set up;Sitting;Wash/dry face   Upper Body Bathing: Sitting;Bed level;Moderate assistance Upper Body Bathing Details (indicate cue type and reason): Assist to bathe L UE, back due to R hand pain and difficulty reaching, able to bathe chest and R UE Lower Body Bathing: Maximal assistance;Bed level;Sit to/from stand Lower Body Bathing Details (indicate cue type and reason): assist for peri hygiene in standing due to balance deficits, assist for B LE Upper Body Dressing : Minimal assistance;Sitting Upper Body Dressing Details (indicate cue  type and reason): assist due to R Hand pain and multiple lines                   General ADL Comments: Session focused on bathing tasks, functional transfers with RN and NT present. Improving standing and endurance  though new pain in R dominant UE limiting functional abiltiies    Extremity/Trunk Assessment Upper Extremity Assessment Upper Extremity Assessment: RUE deficits/detail RUE Deficits / Details: new pain on R side of hand, difficulty in making a fist and holding objects RUE Coordination: decreased fine motor   Lower Extremity Assessment Lower Extremity Assessment: Defer to PT evaluation        Vision   Vision Assessment?: No apparent visual deficits   Perception     Praxis      Cognition Arousal/Alertness: Awake/alert Behavior During Therapy: WFL for tasks assessed/performed Overall Cognitive Status: No family/caregiver present to determine baseline cognitive functioning Area of Impairment: Problem solving;Memory;Attention                   Current Attention Level: Selective Memory: Decreased short-term memory       Problem Solving: Slow processing;Requires verbal cues General Comments: Pt pleasant, follows directions consistently , does benefit from cues to stay on task          Exercises Exercises: General Upper Extremity General Exercises - Upper Extremity Shoulder Horizontal ABduction: Strengthening;Both;10 reps;Seated;Theraband Theraband Level (Shoulder Horizontal Abduction): Level 1 (Yellow)   Shoulder Instructions       General Comments SpO2 98-100% on RA, HR WFL. BP 100/60 after transfer. RN and NT present during session. Confirmed with MD/NP light resistance exercises ok if keeping shoulder <90* flexion. due to R hand pain, pt only able to complete 1/3 UE exercises at this time    Pertinent Vitals/ Pain       Pain Assessment: Faces Faces Pain Scale: Hurts even more Pain Location: scrotum/penis with peri care, R side of hand Pain Descriptors / Indicators: Discomfort;Grimacing;Sore Pain Intervention(s): Monitored during session;Limited activity within patient's tolerance  Home Living                                           Prior Functioning/Environment              Frequency  Min 2X/week        Progress Toward Goals  OT Goals(current goals can now be found in the care plan section)  Progress towards OT goals: Progressing toward goals  Acute Rehab OT Goals Patient Stated Goal: decrease hand pain, go to different rehab OT Goal Formulation: With patient Time For Goal Achievement: 12/14/20 Potential to Achieve Goals: Good ADL Goals Pt Will Perform Lower Body Bathing: with set-up;sitting/lateral leans;sit to/from stand Pt Will Perform Lower Body Dressing: with set-up;sitting/lateral leans;sit to/from stand;with adaptive equipment Pt Will Transfer to Toilet: with set-up;stand pivot transfer;bedside commode Pt/caregiver will Perform Home Exercise Program: Increased strength;Both right and left upper extremity;With theraband;Independently;With written HEP provided  Plan Discharge plan remains appropriate    Co-evaluation                 AM-PAC OT "6 Clicks" Daily Activity     Outcome Measure   Help from another person eating meals?: A Little Help from another person taking care of personal grooming?: A Little Help from another person toileting, which includes using toliet, bedpan, or urinal?:  A Lot Help from another person bathing (including washing, rinsing, drying)?: A Lot Help from another person to put on and taking off regular upper body clothing?: A Little Help from another person to put on and taking off regular lower body clothing?: A Lot 6 Click Score: 15    End of Session    OT Visit Diagnosis: Unsteadiness on feet (R26.81);Other abnormalities of gait and mobility (R26.89);Muscle weakness (generalized) (M62.81)   Activity Tolerance Patient tolerated treatment well;Patient limited by pain   Patient Left in chair;with call bell/phone within reach;with nursing/sitter in room   Nurse Communication Mobility status;Other (comment) (RN present during session)        Time:  [phone] OT Time Calculation (min): 26 min  Charges: OT General Charges $OT Visit: 1 Visit OT Treatments $Self Care/Home Management : 8-22 mins $Therapeutic Activity: 8-22 mins  Malachy Chamber, OTR/L Acute Rehab Services Office: [phone]   Layla Maw 12/03/2020, 10:17 AM

## 2020-12-03 NOTE — Progress Notes (Signed)
NAME:  William Flores, MRN:  [id number], DOB:  [birth year], LOS: 29 ADMISSION DATE:  11/22/2020, CONSULTATION DATE: 11/27/2020 REFERRING MD: Triad, CHIEF COMPLAINT: Volume overload renal failure  History of Present Illness:  William Flores is a 70 year old nursing home resident with acute on chronic right heart failure cor pulmonale severe pulmonary hypertension, metabolic encephalopathy, acute on chronic anemia chronic with acute respiratory failure, cirrhosis secondary to William Flores, chronic atrial fibrillation acute kidney injury, thrombocytopenia diabetes mellitus obstructive sleep apnea who presents with worsening volume overload and pulmonary critical care is asked to transfer to intensive care place a hemodialysis catheter so that he can undergo CRRT.  Pertinent  Medical History   Past Medical History:  Diagnosis Date   CAD (coronary artery disease)    LHC 2/08:  mRCA 50%, o/w no sig CAD, EF 25%   Cardiomyopathy ([place])    EF 35-40% in 2/16 in setting of AF with RVR >> echo 5/16 with improved LVF with EF 65-70%   Chronic atrial fibrillation (HCC)    Chronic diastolic heart failure (HCC)    HFPF - EF ~65-70% (OFTEN EXACERBATED BY AFIB)   CKD (chronic kidney disease)    hx of worsening renal failure and hyperK+ in setting of acute diast CHF >> required CVVHD in 4/16 and dialysis x 1 in 5/16   CKD stage 3 due to type 2 diabetes mellitus ([place]) 09/17/2015   Colon cancer ([place])    Colon polyps 09/21/2012   Tubular adenoma   Diabetes ([place])    Hyperlipidemia    Hypertension    Obesity hypoventilation syndrome ([place])    Renal insufficiency    Sleep apnea     Significant Hospital Events: Including procedures, antibiotic start and stop dates in addition to other pertinent events   11/27/2020 consult to pulmonary critical care 11/27/2020 transfer to intensive care unit place hemodialysis catheter 11/28 was cleared by SLP fro regular diet.  11/29 midodrine increased 11/30 off milrinone, and NE.  Pulling UF -150 and tolerating. Up in chair and not on oxygen. Mental status intact.   Interim History / Subjective:  Off oxygen this morning, denies complaints.  Objective   Blood pressure 113/74, pulse 82, temperature 98.1 F (36.7 C), temperature source Oral, resp. rate 16, height 5' 7"  (1.702 m), weight 107.3 kg, SpO2 100 %. CVP:  [11 mmHg-30 mmHg] 14 mmHg      Intake/Output Summary (Last 24 hours) at 12/03/2020 1201 Last data filed at 12/03/2020 1100 Gross per 24 hour  Intake 1773.83 ml  Output 5058 ml  Net -3284.17 ml    Filed Weights   12/01/20 0501 12/02/20 0600 12/03/20 0600  Weight: 118.2 kg 114.8 kg 107.3 kg    Examination: General : elderly man sitting up in the chair in NAD HENT [place] AT, eyes anicteric Neck: RIJ CVC w/o erythema Pulm breathing comfortably, mild tachypnea on RA. Decreased basilar breath sounds but no rhales or rhonchi Card S1S2, RRR Ext: stasis dermatitis, legs wrapped, edema improving. No clubbing or cyanosis. Abd obese, soft, NT  Neuro awake, alert, moving all extremities, answering questions appropriately Derm: warm, dry  Resolved Hospital Problem list     Assessment & Plan:  Acute on chronic systolic right heart failure with cor pulmonale and severe pulmonary hypertension with hypervolemia. -Con't volume removal with CRRT. Eventually will need iHD for volume management.Bps have improved this week with volume off. -tele monitoring -needs to use CPAP whenever sleeping and avoid hypoxia. Doing great on RA currently (baseline is 2L) -  con't sildenafil TID -con't una boots -midodrine to maintain BP  Acute on chronic hypoxic/hypercapnic respiratory failure OSA Likely OHS -Stressed the importance of NIPPV whenever sleeping -monitor saturations and avoid hypoxia; doing well on RA. Maintain SpO2 >90% -con't optimizing volume status  Chronic A. Fib with rapid ventricular response, now rate controlled -con't oral amiodarone -heparin for  [facility] -tele monitoring  AKI on CKD stage IIIb, Anuric BPH -cont CRRT, appreciate nephrology's management -con't to renally dose meds, avoid nephrotoxic meds -unless he starts making urine again, no role for tamsulosin -eventually needs permacath  Cirrhosis with elevated ammonia level status post paracentesis with 2.4 L removed Pruritis -Atarax for itching PRN -lactulose to target 2-3 loose BM per day -manage volume with CRRT  Mild hyponatremia due to hypervolemia -management with HD  Anemia of chronic disease, kidney disease -transfuse for Hb<7 or hemodynamically significant bleeding -Aranesp per nephro  Chronic thrombocytopenia, likely 2/2 liver failure->stable, actually improved a little  -con't to monitor  Diabetes mellitus- uncontrolled hyperglycemia with steroids -increase glargine to 16 units QHS -SSI PRN  Acute metabolic encephalopathy-->resolved but at risk for recurrence  -con't lactulose -nightly NIPPV -avoid deliriogenic meds -con't RRT  Morbid obesity -recommend long-term weight loss  GERD -PPI  Hand pain-- concern for gout but Uric acid <1.5 -ok to con't short course of prednisone  GoC -Previous discussions that he wants everything done. Moving forward, this will depend on his ability to tolerate intermittent HD most likely. Doing well as he gets closer to euvolemia.  Best Practice (right click and "Reselect all SmartList Selections" daily)   Diet/type: Regular consistency (see orders)  DVT prophylaxis: heparin GI prophylaxis: PPI Lines: Dialysis Catheter Foley:  N/A Code Status:  full code Last date of multidisciplinary goals of care discussion. Continue palliative involvement pending trajectory.   This patient is critically ill with multiple organ system failure which requires frequent high complexity decision making, assessment, support, evaluation, and titration of therapies. This was completed through the application of advanced monitoring  technologies and extensive interpretation of multiple databases. During this encounter critical care time was devoted to patient care services described in this note for 35 minutes.  William Hy, DO 12/03/20 12:15 PM [facility]

## 2020-12-03 NOTE — TOC Progression Note (Signed)
Transition of Care [facility]) - Progression Note    Patient Details  Name: MEER REINDL MRN: [id number] Date of Birth: 09-Oct-1950  [facility] CM/SW [place], [place] Phone Number: 12/03/2020, 4:42 PM  Clinical Narrative:    HF CSW reached out to [facility] to see about bed availability awaiting a response. CSW to follow up with Mr. Winstanley about SNF bed offers.  CSW will continue to follow throughout discharge.   Expected Discharge Plan: [place] Barriers to Discharge: Continued Medical Work up  Expected Discharge Plan and Services Expected Discharge Plan: [place] In-house Referral: Clinical Social Work Discharge Planning Services: CM Consult   Living arrangements for the past 2 months: [place]                                       Social Determinants of Health (SDOH) Interventions Food Insecurity Interventions: Intervention Not Indicated Financial Strain Interventions: Intervention Not Indicated Housing Interventions: Intervention Not Indicated, Other (Comment) (Patient is from SNF, [facility]) Transportation Interventions: Intervention Not Indicated (Pt reports the SNF has been getting him to appointments)  Readmission Risk Interventions Readmission Risk Prevention Plan 11/23/2020 10/24/2020 10/15/2020  Transportation Screening Complete Complete Complete  PCP or Specialist Appt within 3-5 Days - - -  HRI or [place] - - Complete  Social Work Consult for [place] Planning/Counseling - - Complete  Palliative Care Screening - - Not Applicable  Medication Review Press photographer) Complete Complete Complete  PCP or Specialist appointment within 3-5 days of discharge Complete (No Data) -  [place] or Home Care Consult Complete Complete -  SW Recovery Care/Counseling Consult Complete Complete -  Palliative Care Screening - Not Applicable -  [place]  Complete Complete -  Some recent data might be hidden   Jeymi Hepp, MSW, LCSWA [phone] Heart Failure Social Worker

## 2020-12-03 NOTE — Progress Notes (Signed)
[facility] for warfarin > heparin while on CRRT Indication: atrial fibrillation  Allergies  Allergen Reactions   Codeine     Headache     Patient Measurements: Height: 5' 7"  (170.2 cm) Weight: 107.3 kg (236 lb 8.9 oz) IBW/kg (Calculated) : 66.1  Vital Signs: Temp: 98.1 F (36.7 C) (12/01 0804) Temp Source: Oral (12/01 0804) BP: 97/67 (12/01 0804) Pulse Rate: 86 (12/01 0804)  Labs: Recent Labs    12/01/20 0330 12/01/20 0335 12/01/20 2057 12/02/20 0458 12/02/20 1338 12/03/20 0504  HGB 8.4*  --   --  9.3*  --  9.4*  HCT 31.0*  --   --  34.5*  --  35.1*  PLT 113*  --   --  122*  --  136*  LABPROT 19.0*  --   --  17.1*  --  16.8*  INR 1.6*  --   --  1.4*  --  1.4*  HEPARINUNFRC  --    < > 0.38 0.44  --  0.26*  CREATININE  --    < > 1.47* 1.36* 1.41* 1.95*   < > = values in this interval not displayed.     Estimated Creatinine Clearance: 42.4 mL/min (A) (by C-G formula based on SCr of 1.95 mg/dL (H)).   Medical History: Past Medical History:  Diagnosis Date   CAD (coronary artery disease)    LHC 2/08:  mRCA 50%, o/w no sig CAD, EF 25%   Cardiomyopathy ([place])    EF 35-40% in 2/16 in setting of AF with RVR >> echo 5/16 with improved LVF with EF 65-70%   Chronic atrial fibrillation (HCC)    Chronic diastolic heart failure (HCC)    HFPF - EF ~65-70% (OFTEN EXACERBATED BY AFIB)   CKD (chronic kidney disease)    hx of worsening renal failure and hyperK+ in setting of acute diast CHF >> required CVVHD in 4/16 and dialysis x 1 in 5/16   CKD stage 3 due to type 2 diabetes mellitus ([place]) 09/17/2015   Colon cancer ([place])    Colon polyps 09/21/2012   Tubular adenoma   Diabetes ([place])    Hyperlipidemia    Hypertension    Obesity hypoventilation syndrome ([place])    Renal insufficiency    Sleep apnea     Medications:    prismasol BGK 4/2.5 500 mL/hr at 12/03/20 0724    prismasol BGK 4/2.5 200 mL/hr at 12/02/20 1755   sodium chloride  5 mL/hr at 12/03/20 0700   heparin 1,800 Units/hr (12/03/20 0700)   prismasol BGK 4/2.5 1,500 mL/hr at 12/03/20 3151     Assessment: Pharmacy consulted to dose warfarin in patient with atrial fibrillation.  INR on admission is 2.7.  Patient is an unreliable historian.  Home dose listed as 7.5 mg daily per med rec.    Patient recently admitted here in October for GI bleed and per GI recommended INR 2-2.5.  INR down to 1.4 today after holding since 11/23.  No overt bleeding or complications noted currently.  Heparin drip 1900 uts/hr yesterday and heparin level 0.44 at goal but up trending> heparin drip decrease 1800 uts/hr and heparin level fell to < goal 0.26 - will increase slightly  CBC stable  No bleeding issues noted - but recent GIB   Goal of Therapy:  Heparin Level goal 0.3-0.5 INR 2-2.5 Monitor platelets by anticoagulation protocol: Yes   Plan:  Increase IV heparin drip rate slightly 1850 units/hr. Daily heparin level and  CBC.  Bonnita Nasuti Pharm.D. CPP, BCPS Clinical Pharmacist [phone] 12/03/2020 8:18 AM

## 2020-12-03 NOTE — Progress Notes (Addendum)
Patient ID: William Flores, male   DOB: 10-Sep-1950, 70 y.o.   MRN: [id number]    Advanced Heart Failure Rounding Note   Subjective:    Co-ox down off Milrinone, 46% (confirmed on repeat). In Afib 80s w/ frequent PVCs.  K 4.4  Mg 2.4   Remains anuric. On CVVH, 5L pulled yesterday. Wt continues to trend down. CVP 13   SBP 90s-100s on midodrine 10 tid.    OOB sitting up in chair. On CVVH, currently pulling 150/hr. Feels ok other than persistent rt hand pain. Little improvement w/ prednisone. Heating pad helps. Denies dyspnea.    Objective:   Weight Range:  Vital Signs:   Temp:  [97.8 F (36.6 C)-98.1 F (36.7 C)] 98.1 F (36.7 C) (12/01 0804) Pulse Rate:  [66-136] 74 (12/01 0900) Resp:  [14-29] 16 (12/01 0900) BP: (86-131)/(47-119) 86/56 (12/01 0900) SpO2:  [85 %-100 %] 85 % (12/01 0900) Weight:  [107.3 kg] 107.3 kg (12/01 0600) Last BM Date: 12/03/20  Weight change: Filed Weights   12/01/20 0501 12/02/20 0600 12/03/20 0600  Weight: 118.2 kg 114.8 kg 107.3 kg    Intake/Output:   Intake/Output Summary (Last 24 hours) at 12/03/2020 1001 Last data filed at 12/03/2020 0900 Gross per 24 hour  Intake 1767.51 ml  Output 4971 ml  Net -3203.49 ml   PHYSICAL EXAM: CVP 13  General:  Well appearing. Sitting up in chair getting CVVH No respiratory difficulty HEENT: normal Neck: supple. JVD to jaw. + Rt IJ CVC Carotids 2+ bilat; no bruits. No lymphadenopathy or thyromegaly appreciated. Cor: PMI nondisplaced. Irregularly irregular rhythm. No rubs, gallops or murmurs. Lungs: clear Abdomen: soft, nontender, nondistended. No hepatosplenomegaly. No bruits or masses. Good bowel sounds. Extremities: no cyanosis, clubbing, rash, 1+ bilateral LE edema + unna boots  Neuro: alert & oriented x 3, cranial nerves grossly intact. moves all 4 extremities w/o difficulty. Affect pleasant.   Telemetry: Afib 80s w/ frequent PVCs (personally reviewed)  Labs: Basic Metabolic Panel: Recent Labs   Lab 11/29/20 0320 11/29/20 1600 11/30/20 0320 11/30/20 1510 12/01/20 0330 12/01/20 0335 12/01/20 2057 12/02/20 0458 12/02/20 1338 12/03/20 0504  NA 135   < > 135   < >  --  134* 132* 132* 132* 132*  K 4.2   < > 4.5   < >  --  4.1 4.6 5.0 4.7 4.4  CL 103   < > 102   < >  --  102 101 102 103 100  CO2 29   < > 27   < >  --  26 24 24 26 27   GLUCOSE 120*   < > 117*   < >  --  140* 176* 158* 185* 144*  BUN 26*   < > 20   < >  --  16 15 16 17 22   CREATININE 1.68*   < > 1.81*   < >  --  1.55* 1.47* 1.36* 1.41* 1.95*  CALCIUM 8.0*   < > 8.1*   < >  --  8.2* 8.3* 8.4* 8.6* 8.6*  MG 2.4  --  2.4  --  2.3  --   --  2.4  --  2.4  PHOS 2.2*   < > 3.2   < >  --  2.0* 4.4 3.2 2.6 2.1*   < > = values in this interval not displayed.    Liver Function Tests: Recent Labs  Lab 12/01/20 0335 12/01/20 2057 12/02/20 0458 12/02/20 1338 12/03/20 4481  ALBUMIN 2.7* 3.1* 2.9* 3.0* 3.0*   No results for input(s): LIPASE, AMYLASE in the last 168 hours. Recent Labs  Lab 11/27/20 1145  AMMONIA 49*    CBC: Recent Labs  Lab 11/29/20 0320 11/30/20 0320 12/01/20 0330 12/02/20 0458 12/03/20 0504  WBC 4.4 3.9* 3.9* 3.6* 6.7  HGB 7.7* 7.8* 8.4* 9.3* 9.4*  HCT 28.3* 29.3* 31.0* 34.5* 35.1*  MCV 92.2 93.3 94.2 95.3 96.2  PLT 113* 111* 113* 122* 136*    Cardiac Enzymes: No results for input(s): CKTOTAL, CKMB, CKMBINDEX, TROPONINI in the last 168 hours.  BNP: BNP (last 3 results) Recent Labs    11/20/20 1128 11/22/20 0425 12/01/20 0330  BNP 306.1* 270.0* 207.3*    ProBNP (last 3 results) No results for input(s): PROBNP in the last 8760 hours.    Other results:  Imaging: No results found.   Medications:     Scheduled Medications:  (feeding supplement) PROSource Plus  30 mL Oral TID BM   allopurinol  300 mg Oral Daily   amiodarone  200 mg Oral BID   atorvastatin  20 mg Oral Daily   B-complex with vitamin C  1 tablet Oral Daily   Chlorhexidine Gluconate Cloth  6 each  Topical Daily   darbepoetin (ARANESP) injection - NON-DIALYSIS  100 mcg Subcutaneous Q Sun-1800   insulin aspart  0-5 Units Subcutaneous QHS   insulin aspart  0-9 Units Subcutaneous TID WC   insulin glargine-yfgn  14 Units Subcutaneous QHS   lactulose  20 g Oral BID   mouth rinse  15 mL Mouth Rinse BID   midodrine  10 mg Oral TID WC   pantoprazole  40 mg Oral Daily   polyethylene glycol  17 g Oral Daily   [START ON 12/04/2020] predniSONE  20 mg Oral Q breakfast   sildenafil  20 mg Oral TID   sodium chloride flush  10-40 mL Intracatheter Q12H   Zinc Oxide   Topical BID    Infusions:   prismasol BGK 4/2.5 500 mL/hr at 12/03/20 0724    prismasol BGK 4/2.5 200 mL/hr at 12/02/20 1755   sodium chloride 5 mL/hr at 12/03/20 0900   heparin 1,850 Units/hr (12/03/20 0900)   prismasol BGK 4/2.5 1,500 mL/hr at 12/03/20 0647   sodium phosphate  Dextrose 5% IVPB      PRN Medications: sodium chloride, acetaminophen, alum & mag hydroxide-simeth, guaiFENesin-dextromethorphan, heparin, heparin, hydrocortisone cream, hydrOXYzine, lip balm, melatonin, oxyCODONE, sodium chloride flush   Assessment/Plan:    1. Cor pulmonale/RV failure: Suspect primarily due to OHS/OSA. Echo in 4/21 with EF 55-60%, RV severely dilated w/ severe RV dysfunction in the setting of severe pulmonary hypertension w/ PASP 76 mmHg. [place] 4/21 with elevated filling pressures, mixed pulmonary venous/pulmonary hypertension=> diuresed w/ IV Lasix in hospital and placed on po torsemide. Echo in 11/22 showed EF 55-60%, mod LVH, D-shaped septum, severe RV enlargement with severely decreased RV systolic function, IVC dilated. NYHA class IV. Now off milrinone and NE. Co-ox down 63>>45%, confirmed on repeat. On CVVH, currently pulling 150/hr. CVP 13.   - Continue midodrine 10 mg tid for BP support  - Continue CVVH with UF net 150 cc/hr today, still have fluid to pull off.  Eventually will need to try to transition to [facility]   - c/w unna boots  -  may need to restart milrinone + amio gtt  2. Pulmonary HTN: PASP elevated at 76 mmHg by 4/21 echo estimate with RV systolic function severely reduced. Most likely  poorly controlled OHS/OSA as cause (group 3 PH).  V/Q scan negative for chronic PE.  Mixed PVH/PAH on RHC.  Echo in 10/22 with severe RV failure and again this admission.   - Continue sildenafil 20 mg tid.  - Continue home oxygen and CPAP at night.    3. AKI on Stage IIIb CKD: Now on CVVH and anuric.  I think he would be a difficult long-term HD candidate (lives in SNF) but looks like he is going to be HD-dependent.   4. Atrial fibrillation: Chronic, unlikely to be able to successfully cardiovert given long-standing atrial fibrillation.   - Continue po amiodarone for rate control for now with soft BP, eventually back to Toprol XL.  - Continue heparin gtt while on CVVH.   5. OSA/OHS: Currently on .  At home, CPAP + oxygen.   6. Diabetes: SSI 7. MGUS: Previously followed by hem/onc but has not seen in several years.  Multiple myeloma panel 4/21 with presence of M-spike. 8. Cirrhosis: ?Due to NAFLD.  11/23 Ammonia 48. Paracentesis 2.4 L this admission. Mental status improved.  - Continue lactulose.  9. Anemia: Hgb 9.4. No obvious source of bleeding.  Has had IV Fe.   10. Encephalopathy: Suspect CO2 narcosis + possible component hepatic encephalopathy with elevated NH3.  Alert/oriented this morning, overall improved.    - He remains on lactulose.  11. Thrombocytopenia: In setting of acute inflammatory illness and possibly splenic sequestration with cirrhosis. Stable in 110s-130s, plts were low before heparin gtt started.  12. GOC: I will consulted palliative care for goals of care.  Lives in SNF, not ideal long-term HD candidate.  Suspect end stage RV failure. Currently he is electing to be full code.  13. Rt Hand pain: Middle digit MCP joint. H/o gout. Pain feels similar.  Now getting prednisone burst, feels better.     Lyda Jester, PA-C  12/03/2020 10:01 AM  Patient seen with PA, agree with the above note.  Weight continues to come down.  Pulling UF net negative 150 cc/hr by CVVH.  CVP 11 today on my read with co-ox low at 47%.  He feels good and walked today.   General: NAD Neck: JVP 10-12 cm, no thyromegaly or thyroid nodule.  Lungs: Clear to auscultation bilaterally with normal respiratory effort. CV: Nondisplaced PMI.  Heart irregular S1/S2, no S3/S4, no murmur.  1+ ankle edema.  Abdomen: Soft, nontender, no hepatosplenomegaly, no distention.  Skin: Intact without lesions or rashes.  Neurologic: Alert and oriented x 3.  Psych: Normal affect. Extremities: No clubbing or cyanosis.  HEENT: Normal.   BP adequate on midodrine (SBP 90s-100s).   In atrial fibrillation, HR ok on amiodarone po, eventually transition back to Toprol XL.   Still some volume overload, continue CVVH pulling 150 cc/hr for 1 more day, then will plan to transition to iHD.  He is anuric, do not believe he will have renal recovery.   CRITICAL CARE Performed by: Loralie Champagne  Total critical care time: 35 minutes  Critical care time was exclusive of separately billable procedures and treating other patients.  Critical care was necessary to treat or prevent imminent or life-threatening deterioration.  Critical care was time spent personally by me on the following activities: development of treatment plan with patient and/or surrogate as well as nursing, discussions with consultants, evaluation of patient's response to treatment, examination of patient, obtaining history from patient or surrogate, ordering and performing treatments and interventions, ordering and review of laboratory studies, ordering  and review of radiographic studies, pulse oximetry and re-evaluation of patient's condition.  Loralie Champagne 12/03/2020 10:36 AM

## 2020-12-03 NOTE — Progress Notes (Signed)
[facility] NEPHROLOGY PROGRESS NOTE  Assessment/ Plan:  # Acute kidney injury on chronic kidney disease stage IIIb: Likely hemodynamically mediated in the setting of acute exacerbation of congestive heart failure.  Started CRRT after refractory to diuretics; ultrafiltration goal around 150 cc/h.  Remains anuric and volume overload (high CVP) therefore continue current CRRT prescription.  All 4K bath. Continue daily lab, strict ins and outs.  No sign of renal recovery therefore he will likely need intermittent HD once volume status and blood pressure improved.  It will be difficult to ultrafiltrate with IHD and hypotension.  Discussed with the patient and the cardiology team.  Continue CRRT, blood pressure is low.  #  Acute exacerbation of diastolic heart failure: With severe pulmonary hypertension and significant third spacing/volume overload.  On CRRT for volume management after refractory to diuretics.  Still volume overload and high CVP.  Cardiology is following.  # Encephalopathy: Likely multifactorial with hypercapnic respiratory failure/CO2 narcosis and azotemia contributing to possible hepatic encephalopathy with slightly elevated ammonia levels.  Mental status improved..  #  Anemia of chronic illness: He has severe iron deficiency with an iron saturation of 4% and ferritin of 31.  Treated with IV iron and received ESA on 11/27.  Monitor hemoglobin and transfuse as needed.  # Chronic atrial fibrillation: Developed A. fib with RVR managing with amiodarone.    #  Thrombocytopenia: Likely from splenic sequestration in the setting of cirrhosis, no evidence of bleeding diathesis.  #Hypophosphatemia:  It is due to CRRT.  Replete phosphorus.  #Hypotension: Increased midodrine to 10 mg 3 times daily.  Blood pressure dropped to 70s last night.  May need IV pressors.  Subjective: Seen and examined in ICU.  Remains anuric without sign of renal recovery.  Noted blood pressure was low  overnight and this morning.  Clinically stable.  Denies nausea, vomiting, chest pain or shortness of breath.  Objective Vital signs in last 24 hours: Vitals:   12/03/20 0700 12/03/20 0800 12/03/20 0804 12/03/20 0900  BP: (!) 131/119 96/67 97/67 (!) 86/56  Pulse: 85 80 86 74  Resp: (!) 24 20 16 16   Temp:   98.1 F (36.7 C)   TempSrc:   Oral   SpO2: 100% 100% 100% (!) 85%  Weight:      Height:       Weight change: -7.5 kg  Intake/Output Summary (Last 24 hours) at 12/03/2020 0935 Last data filed at 12/03/2020 0900 Gross per 24 hour  Intake 1791.5 ml  Output 5198 ml  Net -3406.5 ml        Labs: Basic Metabolic Panel: Recent Labs  Lab 12/02/20 0458 12/02/20 1338 12/03/20 0504  NA 132* 132* 132*  K 5.0 4.7 4.4  CL 102 103 100  CO2 24 26 27   GLUCOSE 158* 185* 144*  BUN 16 17 22   CREATININE 1.36* 1.41* 1.95*  CALCIUM 8.4* 8.6* 8.6*  PHOS 3.2 2.6 2.1*    Liver Function Tests: Recent Labs  Lab 12/02/20 0458 12/02/20 1338 12/03/20 0504  ALBUMIN 2.9* 3.0* 3.0*    No results for input(s): LIPASE, AMYLASE in the last 168 hours. Recent Labs  Lab 11/27/20 1145  AMMONIA 49*    CBC: Recent Labs  Lab 11/29/20 0320 11/30/20 0320 12/01/20 0330 12/02/20 0458 12/03/20 0504  WBC 4.4 3.9* 3.9* 3.6* 6.7  HGB 7.7* 7.8* 8.4* 9.3* 9.4*  HCT 28.3* 29.3* 31.0* 34.5* 35.1*  MCV 92.2 93.3 94.2 95.3 96.2  PLT 113* 111* 113* 122* 136*  Cardiac Enzymes: No results for input(s): CKTOTAL, CKMB, CKMBINDEX, TROPONINI in the last 168 hours. CBG: Recent Labs  Lab 12/02/20 0625 12/02/20 1145 12/02/20 1623 12/02/20 2246 12/03/20 0600  GLUCAP 148* 165* 178* 179* 137*     Iron Studies: No results for input(s): IRON, TIBC, TRANSFERRIN, FERRITIN in the last 72 hours. Studies/Results: No results found.  Medications: Infusions:   prismasol BGK 4/2.5 500 mL/hr at 12/03/20 0724    prismasol BGK 4/2.5 200 mL/hr at 12/02/20 1755   sodium chloride 5 mL/hr at 12/03/20  0900   heparin 1,850 Units/hr (12/03/20 0900)   prismasol BGK 4/2.5 1,500 mL/hr at 12/03/20 4585    Scheduled Medications:  (feeding supplement) PROSource Plus  30 mL Oral TID BM   allopurinol  300 mg Oral Daily   amiodarone  200 mg Oral BID   atorvastatin  20 mg Oral Daily   B-complex with vitamin C  1 tablet Oral Daily   Chlorhexidine Gluconate Cloth  6 each Topical Daily   darbepoetin (ARANESP) injection - NON-DIALYSIS  100 mcg Subcutaneous Q Sun-1800   insulin aspart  0-5 Units Subcutaneous QHS   insulin aspart  0-9 Units Subcutaneous TID WC   insulin glargine-yfgn  14 Units Subcutaneous QHS   lactulose  20 g Oral BID   mouth rinse  15 mL Mouth Rinse BID   midodrine  10 mg Oral TID WC   pantoprazole  40 mg Oral Daily   polyethylene glycol  17 g Oral Daily   [START ON 12/04/2020] predniSONE  20 mg Oral Q breakfast   sildenafil  20 mg Oral TID   sodium chloride flush  10-40 mL Intracatheter Q12H   Zinc Oxide   Topical BID    have reviewed scheduled and prn medications.  Physical Exam: General: Able to lie flat, not in distress Heart:RRR, s1s2 nl Lungs: Coarse and decreased basal breath sound anteriorly. Abdomen:soft, Non-tender, non-distended Extremities: Wrapped with bandages in both legs, edema improving. Dialysis Access: temp HD catheter   Miller [place] Prasad Jeymi Hepp 12/03/2020,9:35 AM  LOS: 11 days

## 2020-12-03 NOTE — Progress Notes (Signed)
   12/03/20 1100  Clinical Encounter Type  Visited With Patient  Visit Type Follow-up;Psychological support;Spiritual support;Social support;Critical Care  Spiritual Encounters  Spiritual Needs Emotional;Prayer  Stress Factors  Patient Stress Factors Family relationships;Major life changes   CH visited pt. while rounding on [place]; pt. sitting in recliner watching TV.  He shared that he is being treated for "heart failure" and that he has been diagnosed with kidney disease and will be starting dialysis soon.  Pt. feels he is improving but acknowledges that he will likely be in the hospital for a while still.  Pt. became tearful in describing his family, sharing that he had not seen his son in two years until his son visited him yesterday in the hospital; pt. also has a stepson and step daughter and several grandchildren.  Pt. says he used to see these family members more frequently but that his health has made it more difficult recently.  Pt. requested prayer for continued recovery.  Chaplains remain available as needed and this [place] Curia will try to follow up w/pt. later this weekend if possible.  Lindaann Pascal, Chaplain Pager: [phone]

## 2020-12-04 ENCOUNTER — Other Ambulatory Visit (HOSPITAL_COMMUNITY): Payer: Self-pay

## 2020-12-04 DIAGNOSIS — I272 Pulmonary hypertension, unspecified: Secondary | ICD-10-CM | POA: Diagnosis not present

## 2020-12-04 DIAGNOSIS — N179 Acute kidney failure, unspecified: Secondary | ICD-10-CM | POA: Diagnosis not present

## 2020-12-04 DIAGNOSIS — I5023 Acute on chronic systolic (congestive) heart failure: Secondary | ICD-10-CM | POA: Diagnosis not present

## 2020-12-04 DIAGNOSIS — I4891 Unspecified atrial fibrillation: Secondary | ICD-10-CM | POA: Diagnosis not present

## 2020-12-04 DIAGNOSIS — I50810 Right heart failure, unspecified: Secondary | ICD-10-CM | POA: Diagnosis not present

## 2020-12-04 DIAGNOSIS — I482 Chronic atrial fibrillation, unspecified: Secondary | ICD-10-CM | POA: Diagnosis not present

## 2020-12-04 LAB — CBC
HCT: 36 % — ABNORMAL LOW (ref 39.0–52.0)
Hemoglobin: 10.1 g/dL — ABNORMAL LOW (ref 13.0–17.0)
MCH: 26.4 pg (ref 26.0–34.0)
MCHC: 28.1 g/dL — ABNORMAL LOW (ref 30.0–36.0)
MCV: 94.2 fL (ref 80.0–100.0)
Platelets: 155 10*3/uL (ref 150–400)
RBC: 3.82 MIL/uL — ABNORMAL LOW (ref 4.22–5.81)
RDW: 23.6 % — ABNORMAL HIGH (ref 11.5–15.5)
WBC: 8.4 10*3/uL (ref 4.0–10.5)
nRBC: 0 % (ref 0.0–0.2)

## 2020-12-04 LAB — COOXEMETRY PANEL
Carboxyhemoglobin: 2 % — ABNORMAL HIGH (ref 0.5–1.5)
Methemoglobin: 0.9 % (ref 0.0–1.5)
O2 Saturation: 58.5 %
Total hemoglobin: 8.9 g/dL — ABNORMAL LOW (ref 12.0–16.0)

## 2020-12-04 LAB — RENAL FUNCTION PANEL
Albumin: 3.3 g/dL — ABNORMAL LOW (ref 3.5–5.0)
Albumin: 3.3 g/dL — ABNORMAL LOW (ref 3.5–5.0)
Anion gap: 5 (ref 5–15)
Anion gap: 9 (ref 5–15)
BUN: 26 mg/dL — ABNORMAL HIGH (ref 8–23)
BUN: 38 mg/dL — ABNORMAL HIGH (ref 8–23)
CO2: 27 mmol/L (ref 22–32)
CO2: 24 mmol/L (ref 22–32)
Calcium: 8.7 mg/dL — ABNORMAL LOW (ref 8.9–10.3)
Calcium: 8.5 mg/dL — ABNORMAL LOW (ref 8.9–10.3)
Chloride: 102 mmol/L (ref 98–111)
Chloride: 99 mmol/L (ref 98–111)
Creatinine, Ser: 1.81 mg/dL — ABNORMAL HIGH (ref 0.61–1.24)
Creatinine, Ser: 2.63 mg/dL — ABNORMAL HIGH (ref 0.61–1.24)
GFR, Estimated: 40 mL/min — ABNORMAL LOW (ref >60)
GFR, Estimated: 26 mL/min — ABNORMAL LOW (ref >60)
Glucose, Bld: 115 mg/dL — ABNORMAL HIGH (ref 70–99)
Glucose, Bld: 279 mg/dL — ABNORMAL HIGH (ref 70–99)
Phosphorus: 1.9 mg/dL — ABNORMAL LOW (ref 2.5–4.6)
Phosphorus: 5.2 mg/dL — ABNORMAL HIGH (ref 2.5–4.6)
Potassium: 4.2 mmol/L (ref 3.5–5.1)
Potassium: 4.6 mmol/L (ref 3.5–5.1)
Sodium: 134 mmol/L — ABNORMAL LOW (ref 135–145)
Sodium: 132 mmol/L — ABNORMAL LOW (ref 135–145)

## 2020-12-04 LAB — GLUCOSE, CAPILLARY
Glucose-Capillary: 116 mg/dL — ABNORMAL HIGH (ref 70–99)
Glucose-Capillary: 111 mg/dL — ABNORMAL HIGH (ref 70–99)
Glucose-Capillary: 152 mg/dL — ABNORMAL HIGH (ref 70–99)
Glucose-Capillary: 304 mg/dL — ABNORMAL HIGH (ref 70–99)
Glucose-Capillary: 169 mg/dL — ABNORMAL HIGH (ref 70–99)

## 2020-12-04 LAB — MAGNESIUM: Magnesium: 2.6 mg/dL — ABNORMAL HIGH (ref 1.7–2.4)

## 2020-12-04 LAB — PROTIME-INR
INR: 1.3 — ABNORMAL HIGH (ref 0.8–1.2)
Prothrombin Time: 16.4 seconds — ABNORMAL HIGH (ref 11.4–15.2)

## 2020-12-04 LAB — HEPARIN LEVEL (UNFRACTIONATED): Heparin Unfractionated: 0.29 IU/mL — ABNORMAL LOW (ref 0.30–0.70)

## 2020-12-04 MED ORDER — CHLORHEXIDINE GLUCONATE CLOTH 2 % EX PADS
6.0000 | MEDICATED_PAD | Freq: Every day | CUTANEOUS | Status: DC
  Administered 2020-12-06 – 2020-12-07 (×2): 6 via TOPICAL

## 2020-12-04 MED ORDER — SODIUM PHOSPHATES 45 MMOLE/15ML IV SOLN
30.0000 mmol | Freq: Once | INTRAVENOUS | Status: AC
  Administered 2020-12-04: 30 mmol via INTRAVENOUS
  Filled 2020-12-04: qty 10

## 2020-12-04 MED ORDER — MIDODRINE HCL 5 MG PO TABS
15.0000 mg | ORAL_TABLET | Freq: Three times a day (TID) | ORAL | Status: DC
  Administered 2020-12-04 – 2020-12-11 (×22): 15 mg via ORAL
  Filled 2020-12-04 (×22): qty 3

## 2020-12-04 NOTE — Progress Notes (Signed)
[place] for warfarin > heparin while on CRRT Indication: atrial fibrillation  Allergies  Allergen Reactions   Codeine     Headache     Patient Measurements: Height: 5' 7"  (170.2 cm) Weight: 106.9 kg (235 lb 10.8 oz) IBW/kg (Calculated) : 66.1  Vital Signs: Temp: 98.8 F (37.1 C) (12/02 1112) Temp Source: Oral (12/02 1112) BP: 96/64 (12/02 1100) Pulse Rate: 86 (12/02 1100)  Labs: Recent Labs    12/02/20 0458 12/02/20 1338 12/03/20 0504 12/03/20 1000 12/03/20 1712 12/04/20 0418  HGB 9.3*  --  9.4*  --   --  10.1*  HCT 34.5*  --  35.1*  --   --  36.0*  PLT 122*  --  136*  --   --  155  LABPROT 17.1*  --  16.8*  --   --  16.4*  INR 1.4*  --  1.4*  --   --  1.3*  HEPARINUNFRC 0.44  --  0.26*  --   --  0.29*  CREATININE 1.36*   < > 1.95* 1.82* 1.81* 1.81*   < > = values in this interval not displayed.     Estimated Creatinine Clearance: 45.5 mL/min (A) (by C-G formula based on SCr of 1.81 mg/dL (H)).   Medical History: Past Medical History:  Diagnosis Date   CAD (coronary artery disease)    LHC 2/08:  mRCA 50%, o/w no sig CAD, EF 25%   Cardiomyopathy ([place])    EF 35-40% in 2/16 in setting of AF with RVR >> echo 5/16 with improved LVF with EF 65-70%   Chronic atrial fibrillation (HCC)    Chronic diastolic heart failure (HCC)    HFPF - EF ~65-70% (OFTEN EXACERBATED BY AFIB)   CKD (chronic kidney disease)    hx of worsening renal failure and hyperK+ in setting of acute diast CHF >> required CVVHD in 4/16 and dialysis x 1 in 5/16   CKD stage 3 due to type 2 diabetes mellitus ([place]) 09/17/2015   Colon cancer ([place])    Colon polyps 09/21/2012   Tubular adenoma   Diabetes ([place])    Hyperlipidemia    Hypertension    Obesity hypoventilation syndrome ([place])    Renal insufficiency    Sleep apnea     Medications:   sodium chloride Stopped (12/04/20 0624)   heparin 1,850 Units/hr (12/04/20 1000)   sodium phosphate  Dextrose 5%  IVPB 43 mL/hr at 12/04/20 1000     Assessment: Pharmacy consulted to dose warfarin in patient with atrial fibrillation.  INR on admission is 2.7.  Patient is an unreliable historian.  Home dose listed as 7.5 mg daily per med rec.    Patient recently admitted here in October for GI bleed and per GI recommended INR 2-2.5.  INR down to 1.4 today after holding since 11/23.  No overt bleeding or complications noted currently.  Heparin drip 1850 units/hr.  Level just slightly below goal (0.29).   CBC stable  No bleeding issues noted - but recent GIB   Goal of Therapy:  Heparin Level goal 0.3-0.5 INR 2-2.5 Monitor platelets by anticoagulation protocol: Yes   Plan:  Continue IV heparin at 1850 units/hr.  Won't increase since stopping CRRT today and heparin may accumulate a bit more. Daily heparin level and CBC. F/u plans to transition to Eliquis once HD access, etc are completed.  Copay is now affordable at $9.85.  [place] Crane, Vena Austria, BCPS, [facility] Clinical Pharmacist  12/04/2020  11:27 AM   [facility] pharmacy phone numbers are listed on [email or web address]

## 2020-12-04 NOTE — Progress Notes (Signed)
Advised by nephrologist of pt's likely need for out-pt HD at d/c. Clinic placement will be based on pt's snf placement at d/c. Will follow and assist as needed.  Melven Sartorius Renal Navigator [phone]

## 2020-12-04 NOTE — Progress Notes (Signed)
[facility] NEPHROLOGY PROGRESS NOTE  Assessment/ Plan:  # Acute kidney injury on chronic kidney disease stage IIIb: Likely hemodynamically mediated in the setting of acute exacerbation of congestive heart failure.  Started CRRT after refractory to diuretics.  The volume status significantly improved with CRRT and now clinically euvolemic.  We will stop CRRT today and plan to attempt intermittent HD tomorrow.  Increase midodrine dose for hypotension. He remains anuric without sign of renal recovery.  If he tolerates intermittent HD then we will plan outpatient HD arrangement for AKI.  Discussed with ICU team and with the patient.  #  Acute exacerbation of diastolic heart failure: With severe pulmonary hypertension and significant third spacing/volume overload.  Volume status improved with CRRT.  Cardiology is following.  Continue fluid restriction.  # Encephalopathy: Likely multifactorial with hypercapnic respiratory failure/CO2 narcosis and azotemia contributing to possible hepatic encephalopathy with slightly elevated ammonia levels.  Mental status improved..  #  Anemia of chronic illness: He has severe iron deficiency with an iron saturation of 4% and ferritin of 31.  Treated with IV iron and received ESA on 11/27.  Monitor hemoglobin and transfuse as needed.  # Chronic atrial fibrillation: Developed A. fib with RVR managing with amiodarone.    #  Thrombocytopenia: Likely from splenic sequestration in the setting of cirrhosis, no evidence of bleeding diathesis.  Platelet count improved.  #Hypophosphatemia:  It is due to CRRT.  Replete phosphorus.  #Hypotension: Increased midodrine to 15 mg 3 times daily.  Monitor BP.   Subjective: Seen and examined in ICU.  Tolerating CRRT well without any concern.  He is able to lie flat and denies any shortness of breath or chest pain.  Blood pressure soft but acceptable. Objective Vital signs in last 24 hours: Vitals:   12/04/20 0629  12/04/20 0700 12/04/20 0730 12/04/20 0800  BP:  105/71  109/75  Pulse:  70 82 77  Resp:  14 (!) 21 (!) 21  Temp:   (!) 97.5 F (36.4 C)   TempSrc:   Oral   SpO2:  100% 100% 91%  Weight: 106.9 kg     Height:       Weight change: -0.4 kg  Intake/Output Summary (Last 24 hours) at 12/04/2020 0833 Last data filed at 12/04/2020 0800 Gross per 24 hour  Intake 2807.25 ml  Output 6074 ml  Net -3266.75 ml        Labs: Basic Metabolic Panel: Recent Labs  Lab 12/03/20 0504 12/03/20 1000 12/03/20 1712 12/04/20 0418  NA 132* 132* 132* 134*  K 4.4 4.4 4.4 4.2  CL 100 102 103 102  CO2 27 24 23 27   GLUCOSE 144* 180* 198* 115*  BUN 22 22 26* 26*  CREATININE 1.95* 1.82* 1.81* 1.81*  CALCIUM 8.6* 8.2* 8.3* 8.7*  PHOS 2.1*  --  2.6 1.9*    Liver Function Tests: Recent Labs  Lab 12/03/20 0504 12/03/20 1712 12/04/20 0418  ALBUMIN 3.0* 3.3* 3.3*    No results for input(s): LIPASE, AMYLASE in the last 168 hours. Recent Labs  Lab 11/27/20 1145  AMMONIA 49*    CBC: Recent Labs  Lab 11/30/20 0320 12/01/20 0330 12/02/20 0458 12/03/20 0504 12/04/20 0418  WBC 3.9* 3.9* 3.6* 6.7 8.4  HGB 7.8* 8.4* 9.3* 9.4* 10.1*  HCT 29.3* 31.0* 34.5* 35.1* 36.0*  MCV 93.3 94.2 95.3 96.2 94.2  PLT 111* 113* 122* 136* 155    Cardiac Enzymes: No results for input(s): CKTOTAL, CKMB, CKMBINDEX, TROPONINI in the  last 168 hours. CBG: Recent Labs  Lab 12/03/20 1107 12/03/20 1613 12/03/20 2148 12/04/20 0631 12/04/20 0734  GLUCAP 210* 303* 201* 116* 111*     Iron Studies: No results for input(s): IRON, TIBC, TRANSFERRIN, FERRITIN in the last 72 hours. Studies/Results: No results found.  Medications: Infusions:  sodium chloride Stopped (12/04/20 0624)   heparin 1,850 Units/hr (12/04/20 0800)   sodium phosphate  Dextrose 5% IVPB 43 mL/hr at 12/04/20 0800    Scheduled Medications:  (feeding supplement) PROSource Plus  30 mL Oral TID BM   allopurinol  300 mg Oral Daily    amiodarone  200 mg Oral BID   atorvastatin  20 mg Oral Daily   B-complex with vitamin C  1 tablet Oral Daily   Chlorhexidine Gluconate Cloth  6 each Topical Daily   darbepoetin (ARANESP) injection - NON-DIALYSIS  100 mcg Subcutaneous Q Sun-1800   insulin aspart  0-5 Units Subcutaneous QHS   insulin aspart  0-9 Units Subcutaneous TID WC   insulin glargine-yfgn  16 Units Subcutaneous QHS   lactulose  20 g Oral BID   mouth rinse  15 mL Mouth Rinse BID   midodrine  15 mg Oral TID WC   pantoprazole  40 mg Oral Daily   polyethylene glycol  17 g Oral Daily   predniSONE  20 mg Oral Q breakfast   sildenafil  20 mg Oral TID   sodium chloride flush  10-40 mL Intracatheter Q12H   Zinc Oxide   Topical BID    have reviewed scheduled and prn medications.  Physical Exam: General: Able to lie flat, not in distress Heart:RRR, s1s2 nl Lungs: Clear anteriorly, no increased work of breathing Abdomen:soft, Non-tender, non-distended Extremities: Wrapped with bandages in both legs, edema is much better now. Dialysis Access: temp HD catheter   William Flores William Flores 12/04/2020,8:33 AM  LOS: 12 days
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
## 2020-12-04 NOTE — Plan of Care (Signed)
  Problem: Education: Goal: Knowledge of General Education information will improve Description: Including pain rating scale, medication(s)/side effects and non-pharmacologic comfort measures Outcome: Progressing   Problem: Health Behavior/Discharge Planning: Goal: Ability to manage health-related needs will improve Outcome: Progressing   Problem: Clinical Measurements: Goal: Ability to maintain clinical measurements within normal limits will improve Outcome: Progressing Goal: Will remain free from infection Outcome: Progressing Goal: Diagnostic test results will improve Outcome: Progressing Goal: Respiratory complications will improve Outcome: Progressing Goal: Cardiovascular complication will be avoided Outcome: Progressing   Problem: Activity: Goal: Risk for activity intolerance will decrease Outcome: Progressing   Problem: Nutrition: Goal: Adequate nutrition will be maintained Outcome: Progressing   Problem: Coping: Goal: Level of anxiety will decrease Outcome: Progressing   Problem: Elimination: Goal: Will not experience complications related to bowel motility Outcome: Progressing Goal: Will not experience complications related to urinary retention Outcome: Progressing   Problem: Pain Managment: Goal: General experience of comfort will improve Outcome: Progressing   Problem: Safety: Goal: Ability to remain free from injury will improve Outcome: Progressing   Problem: Skin Integrity: Goal: Risk for impaired skin integrity will decrease Outcome: Progressing   Problem: Activity: Goal: Capacity to carry out activities will improve Outcome: Progressing   Problem: Cardiac: Goal: Ability to achieve and maintain adequate cardiopulmonary perfusion will improve Outcome: Progressing   Problem: Education: Goal: Ability to demonstrate management of disease process will improve Outcome: Progressing Goal: Ability to verbalize understanding of medication therapies  will improve Outcome: Progressing Goal: Individualized Educational Video(s) Outcome: Progressing   Problem: Activity: Goal: Capacity to carry out activities will improve Outcome: Progressing   Problem: Cardiac: Goal: Ability to achieve and maintain adequate cardiopulmonary perfusion will improve Outcome: Progressing

## 2020-12-04 NOTE — Progress Notes (Addendum)
Patient ID: William Flores, male   DOB: 10/24/1950, 70 y.o.   MRN: [id number]    Advanced Heart Failure Rounding Note   Subjective:    Co-ox 58% off milrinone.  Rhythm AF, PVCs 10-15/min + brief 3-4 beat runs NSVT  Made small amount of unmeasured urine yesterday while having bowel movement. On CVVH, 6L pulled yesterday with UF goal of -150 cc/hr.    CVP 12  SBP improved to 100s-120 on midodrine 10 mg TID  Feels okay. Only complaint is ongoing right hand pain which was improved somewhat with prednisone burst   Objective:     Vital Signs:   Temp:  [97.6 F (36.4 C)-98.5 F (36.9 C)] 97.6 F (36.4 C) (12/02 0400) Pulse Rate:  [71-90] 74 (12/02 0600) Resp:  [14-24] 14 (12/02 0600) BP: (86-131)/(54-119) 106/74 (12/02 0600) SpO2:  [68 %-100 %] 100 % (12/02 0600) Weight:  [106.9 kg] 106.9 kg (12/02 0629) Last BM Date: 12/03/20  Weight change: Filed Weights   12/02/20 0600 12/03/20 0600 12/04/20 0629  Weight: 114.8 kg 107.3 kg 106.9 kg    Intake/Output:   Intake/Output Summary (Last 24 hours) at 12/04/2020 0658 Last data filed at 12/04/2020 0600 Gross per 24 hour  Intake 2587.39 ml  Output 6109 ml  Net -3521.61 ml   PHYSICAL EXAM: CVP 12 General:  Lying in bed comfortably HEENT: normal Neck: supple. JVP 12-14 cm R IJ CVC Cor: PMI nondisplaced. Irregular rhythm. No rubs, gallops or murmurs. Lungs: clear Abdomen: soft, nontender, nondistended.  Extremities: no cyanosis, clubbing, rash, 1+ edema, + RUE PICC Neuro: alert & orientedx3, cranial nerves grossly intact. moves all 4 extremities w/o difficulty. Affect pleasant    Telemetry: AF 60s-70s, frequent PVCs (10-15/min) with short 3-4 beat runs NSVT  Labs: Basic Metabolic Panel: Recent Labs  Lab 12/01/20 0330 12/01/20 0335 12/02/20 0458 12/02/20 1338 12/03/20 0504 12/03/20 1000 12/03/20 1712 12/04/20 0418  NA  --    < > 132* 132* 132* 132* 132* 134*  K  --    < > 5.0 4.7 4.4 4.4 4.4 4.2  CL  --    < >  102 103 100 102 103 102  CO2  --    < > 24 26 27 24 23 27   GLUCOSE  --    < > 158* 185* 144* 180* 198* 115*  BUN  --    < > 16 17 22 22  26* 26*  CREATININE  --    < > 1.36* 1.41* 1.95* 1.82* 1.81* 1.81*  CALCIUM  --    < > 8.4* 8.6* 8.6* 8.2* 8.3* 8.7*  MG 2.3  --  2.4  --  2.4 2.4  --  2.6*  PHOS  --    < > 3.2 2.6 2.1*  --  2.6 1.9*   < > = values in this interval not displayed.    Liver Function Tests: Recent Labs  Lab 12/02/20 0458 12/02/20 1338 12/03/20 0504 12/03/20 1712 12/04/20 0418  ALBUMIN 2.9* 3.0* 3.0* 3.3* 3.3*   No results for input(s): LIPASE, AMYLASE in the last 168 hours. Recent Labs  Lab 11/27/20 1145  AMMONIA 49*    CBC: Recent Labs  Lab 11/30/20 0320 12/01/20 0330 12/02/20 0458 12/03/20 0504 12/04/20 0418  WBC 3.9* 3.9* 3.6* 6.7 8.4  HGB 7.8* 8.4* 9.3* 9.4* 10.1*  HCT 29.3* 31.0* 34.5* 35.1* 36.0*  MCV 93.3 94.2 95.3 96.2 94.2  PLT 111* 113* 122* 136* 155    Cardiac Enzymes: No  results for input(s): CKTOTAL, CKMB, CKMBINDEX, TROPONINI in the last 168 hours.  BNP: BNP (last 3 results) Recent Labs    11/20/20 1128 11/22/20 0425 12/01/20 0330  BNP 306.1* 270.0* 207.3*    ProBNP (last 3 results) No results for input(s): PROBNP in the last 8760 hours.    Other results:  Imaging: No results found.   Medications:     Scheduled Medications:  (feeding supplement) PROSource Plus  30 mL Oral TID BM   allopurinol  300 mg Oral Daily   amiodarone  200 mg Oral BID   atorvastatin  20 mg Oral Daily   B-complex with vitamin C  1 tablet Oral Daily   Chlorhexidine Gluconate Cloth  6 each Topical Daily   darbepoetin (ARANESP) injection - NON-DIALYSIS  100 mcg Subcutaneous Q Sun-1800   insulin aspart  0-5 Units Subcutaneous QHS   insulin aspart  0-9 Units Subcutaneous TID WC   insulin glargine-yfgn  16 Units Subcutaneous QHS   lactulose  20 g Oral BID   mouth rinse  15 mL Mouth Rinse BID   midodrine  10 mg Oral TID WC   pantoprazole   40 mg Oral Daily   polyethylene glycol  17 g Oral Daily   predniSONE  20 mg Oral Q breakfast   sildenafil  20 mg Oral TID   sodium chloride flush  10-40 mL Intracatheter Q12H   Zinc Oxide   Topical BID    Infusions:   prismasol BGK 4/2.5 500 mL/hr at 12/04/20 0412    prismasol BGK 4/2.5 200 mL/hr at 12/03/20 2004   sodium chloride 5 mL/hr at 12/04/20 0600   heparin 1,850 Units/hr (12/04/20 0600)   prismasol BGK 4/2.5 1,500 mL/hr at 12/04/20 5176   sodium phosphate  Dextrose 5% IVPB 30 mmol (12/04/20 0624)    PRN Medications: sodium chloride, acetaminophen, alum & mag hydroxide-simeth, guaiFENesin-dextromethorphan, heparin, heparin, hydrocortisone cream, hydrOXYzine, lip balm, melatonin, Muscle Rub, oxyCODONE, sodium chloride flush   Assessment/Plan:    1. Cor pulmonale/RV failure: Suspect primarily due to OHS/OSA. Echo in 4/21 with EF 55-60%, RV severely dilated w/ severe RV dysfunction in the setting of severe pulmonary hypertension w/ PASP 76 mmHg. [place] 4/21 with elevated filling pressures, mixed pulmonary venous/pulmonary hypertension=> diuresed w/ IV Lasix in hospital and placed on po torsemide. Echo in 11/22 showed EF 55-60%, mod LVH, D-shaped septum, severe RV enlargement with severely decreased RV systolic function, IVC dilated. NYHA class IV. Now off milrinone and NE. Co-ox 63>>45%>>58%. On CVVH, currently pulling 150/hr. Pulled 6L yesterday. CVP 12 - Continue CVVH with UF net 150 cc/hr today, still have some fluid to pull off.  Not sure how much lower we will be able to get CVP with RV failure. Eventually will need to try to transition to [facility].   - Continues midodrine 10 mg tid for BP support, improving today - c/w unna boots  2. Pulmonary HTN: PASP elevated at 76 mmHg by 4/21 echo estimate with RV systolic function severely reduced. Most likely poorly controlled OHS/OSA as cause (group 3 PH).  V/Q scan negative for chronic PE.  Mixed PVH/PAH on RHC.  Echo in 10/22 with severe RV  failure and again this admission.   - Continue sildenafil 20 mg tid.  - Continue home oxygen and CPAP at night.    3. AKI on Stage IIIb CKD: Now on CVVH and anuric.  I think he would be a difficult long-term HD candidate (lives in [place]) but looks like he is  going to be HD-dependent.   4. Atrial fibrillation: Chronic, unlikely to be able to successfully cardiovert given long-standing atrial fibrillation.   - Continue po amiodarone for rate control for now with soft BP, eventually back to Toprol XL.  - Continue heparin gtt while on CVVH.   5. PVCs - On po amiodarone 200 mg BID - Keep K > 4 and Mag > 2 6. OSA/OHS: Currently on [place].  At home, CPAP + oxygen.   7. Diabetes: SSI 8. MGUS: Previously followed by hem/onc but has not seen in several years.  Multiple myeloma panel 4/21 with presence of M-spike. 9. Cirrhosis: ?Due to NAFLD.  11/23 Ammonia 48. Paracentesis 2.4 L this admission. Mental status improved.  - Continue lactulose.  10. Anemia: Hgb 9.4>10.1. No obvious source of bleeding.  Has had IV Fe.   11. Encephalopathy: Suspect CO2 narcosis + possible component hepatic encephalopathy with elevated NH3.  Alert/oriented this morning, overall improved.    - He remains on lactulose.  12. Thrombocytopenia: In setting of acute inflammatory illness and possibly splenic sequestration with cirrhosis. Plts were low before heparin gtt started. Improved from 110s-130s, 155 today 13. Rt Hand pain: Middle digit MCP joint. H/o gout. Pain feels similar.  Now getting prednisone burst, feels better.   GOC: Lives in SNF, not ideal long-term HD candidate.  Suspect end stage RV failure. Palliative care saw 11/28. For now has elected to remain full code.    Leata Mouse, PA-C  12/04/2020 6:58 AM  Patient seen with PA, agree with the above note.   BP has been more stable, on midodrine 10 tid. Breathing improved, able to walk around in hall.  CVP still 12 but weight now down 52 lbs, CVVH ongoing pulling net  150 cc/hr. Co-ox 58%.   General: NAD Neck: JVP 10 cm, no thyromegaly or thyroid nodule.  Lungs: Clear to auscultation bilaterally with normal respiratory effort. CV: Nondisplaced PMI.  Heart irregular S1/S2, no S3/S4, no murmur.  Trace ankle edema.  Abdomen: Soft, nontender, no hepatosplenomegaly, no distention.  Skin: Intact without lesions or rashes.  Neurologic: Alert and oriented x 3.  Psych: Normal affect. Extremities: No clubbing or cyanosis.  HEENT: Normal.   I think that at this point we are ready to make transition from CVVH to iHD.  He has been anuric.  He will need a permanent HD catheter placed.    HR controlled with po amiodarone. After stopping CVVH, will need to switch to low dose Toprol XL.  Will continue midodrine for now for BP control.   Gout pain in right hand improved, continues on prednisone with stop date.   On heparin gtt, after he has permanent HD catheter, would prefer transition to Eliquis but had been on warfarin at home because of insurance issues.  Pharmacy to explore.   CRITICAL CARE Performed by: Loralie Champagne  Total critical care time: 35 minutes  Critical care time was exclusive of separately billable procedures and treating other patients.  Critical care was necessary to treat or prevent imminent or life-threatening deterioration.  Critical care was time spent personally by me on the following activities: development of treatment plan with patient and/or surrogate as well as nursing, discussions with consultants, evaluation of patient's response to treatment, examination of patient, obtaining history from patient or surrogate, ordering and performing treatments and interventions, ordering and review of laboratory studies, ordering and review of radiographic studies, pulse oximetry and re-evaluation of patient's condition.   Loralie Champagne 12/04/2020 8:10  AM

## 2020-12-04 NOTE — TOC Benefit Eligibility Note (Signed)
Patient Teacher, English as a foreign language completed.    The patient is currently admitted and upon discharge could be taking Eliquis 5 mg.  The current 30 day co-pay is, $9.85.   The patient is insured through [place], [place] Patient Advocate Specialist [place] Patient Advocate Team Direct Number: [phone]  Fax: [phone]

## 2020-12-04 NOTE — Progress Notes (Signed)
NAME:  William Flores, MRN:  [id number], DOB:  Jan 07, 1950, LOS: 40 ADMISSION DATE:  11/22/2020, CONSULTATION DATE: 11/27/2020 REFERRING MD: Triad, CHIEF COMPLAINT: Volume overload renal failure  History of Present Illness:  William Flores is a 70 year old nursing home resident with acute on chronic right heart failure cor pulmonale severe pulmonary hypertension, metabolic encephalopathy, acute on chronic anemia chronic with acute respiratory failure, cirrhosis secondary to William Flores, chronic atrial fibrillation acute kidney injury, thrombocytopenia diabetes mellitus obstructive sleep apnea who presents with worsening volume overload and pulmonary critical care is asked to transfer to intensive care place a hemodialysis catheter so that he can undergo CRRT.  Pertinent  Medical History   Past Medical History:  Diagnosis Date   CAD (coronary artery disease)    LHC 2/08:  mRCA 50%, o/w no sig CAD, EF 25%   Cardiomyopathy ([place])    EF 35-40% in 2/16 in setting of AF with RVR >> echo 5/16 with improved LVF with EF 65-70%   Chronic atrial fibrillation (HCC)    Chronic diastolic heart failure (HCC)    HFPF - EF ~65-70% (OFTEN EXACERBATED BY AFIB)   CKD (chronic kidney disease)    hx of worsening renal failure and hyperK+ in setting of acute diast CHF >> required CVVHD in 4/16 and dialysis x 1 in 5/16   CKD stage 3 due to type 2 diabetes mellitus ([place]) 09/17/2015   Colon cancer ([place])    Colon polyps 09/21/2012   Tubular adenoma   Diabetes ([place])    Hyperlipidemia    Hypertension    Obesity hypoventilation syndrome ([place])    Renal insufficiency    Sleep apnea     Significant Hospital Events: Including procedures, antibiotic start and stop dates in addition to other pertinent events   11/27/2020 consult to pulmonary critical care 11/27/2020 transfer to intensive care unit place hemodialysis catheter 11/28 was cleared by SLP fro regular diet.  11/29 midodrine increased 11/30 off milrinone, and NE.  Pulling UF -150 and tolerating. Up in chair and not on oxygen. Mental status intact.  12/2 transitioning to IHD  Interim History / Subjective:  Was off oxygen yesterday.  Denies shortness of breath.  Appetite has improved since admission with fluid removal.  Objective   Blood pressure 109/75, pulse 77, temperature (!) 97.5 F (36.4 C), temperature source Oral, resp. rate (!) 21, height 5' 7"  (1.702 m), weight 106.9 kg, SpO2 91 %. CVP:  [8 mmHg-19 mmHg] 12 mmHg      Intake/Output Summary (Last 24 hours) at 12/04/2020 0811 Last data filed at 12/04/2020 0800 Gross per 24 hour  Intake 2807.25 ml  Output 6074 ml  Net -3266.75 ml    Filed Weights   12/02/20 0600 12/03/20 0600 12/04/20 0629  Weight: 114.8 kg 107.3 kg 106.9 kg    Examination: General : elderly man laying in bed.  No acute distress HENT [place] AT, eyes anicteric Neck: RIJ CVC w/o erythema Pulm breathing comfortably, on 2 L nasal cannula.  Crackles clear with coughing at bases  CV: RRR,  Ext: stasis dermatitis, legs wrapped, edema minimal otherwise. No clubbing or cyanosis. Abd obese, soft, NT  Neuro awake, alert, moving all extremities, answering questions appropriately Derm: warm, dry  Resolved Hospital Problem list     Assessment & Plan:  Acute on chronic systolic right heart failure with cor pulmonale and severe pulmonary hypertension with hypervolemia. Acute on chronic hypoxic/hypercapnic respiratory failure OSA Likely OHS Chronic A. Fib with rapid ventricular response, now rate controlled  AKI on CKD stage IIIb, Anuric BPH Cirrhosis (cardiac?)  With elevated ammonia level status post paracentesis with 2.4 L removed Pruritis Anemia of chronic disease, kidney disease Chronic thrombocytopenia, likely 2/2 liver failure->stable,  Diabetes mellitus- uncontrolled hyperglycemia with steroids GERD Hand pain-possible pseudogout gout?  Plan:  -Wean oxygen off.  Incentive spirometry progressive  ambulation. -Continue CPAP at nighttime for OSA -Appears euvolemic: We will transition to intermittent dialysis.  Long-term prognosis dependent on ability to tolerate IHD. -If able to tolerate IHD will need permanent vascular access established. -Continue oral amiodarone for atrial fibrillation.  Can consider transition to warfarin for stroke prevention. -Continue lactulose, titrate to 2-3 soft bowel movements per day -Complete short course steroids for possible pseudogout. -Glucose well controlled on current regimen.  Best Practice (right click and "Reselect all SmartList Selections" daily)   Diet/type: Regular consistency (see orders)  DVT prophylaxis: heparin GI prophylaxis: PPI Lines: Dialysis Catheter Foley:  N/A Code Status:  full code Last date of multidisciplinary goals of care discussion. Continue palliative involvement pending trajectory.   CRITICAL CARE Performed by: Kipp Brood   Total critical care time: 35 minutes  Critical care time was exclusive of separately billable procedures and treating other patients.  Critical care was necessary to treat or prevent imminent or life-threatening deterioration.  Critical care was time spent personally by me on the following activities: development of treatment plan with patient and/or surrogate as well as nursing, discussions with consultants, evaluation of patient's response to treatment, examination of patient, obtaining history from patient or surrogate, ordering and performing treatments and interventions, ordering and review of laboratory studies, ordering and review of radiographic studies, pulse oximetry, re-evaluation of patient's condition and participation in multidisciplinary rounds.  Kipp Brood, MD [facility] ICU Physician [place]  Pager: [phone] Mobile: [phone] After hours: [phone].

## 2020-12-05 DIAGNOSIS — I4891 Unspecified atrial fibrillation: Secondary | ICD-10-CM | POA: Diagnosis not present

## 2020-12-05 DIAGNOSIS — I5023 Acute on chronic systolic (congestive) heart failure: Secondary | ICD-10-CM | POA: Diagnosis not present

## 2020-12-05 LAB — HEPATITIS B SURFACE ANTIGEN: Hepatitis B Surface Ag: NONREACTIVE

## 2020-12-05 LAB — HEPATITIS B SURFACE ANTIBODY,QUALITATIVE: Hep B S Ab: REACTIVE — AB

## 2020-12-05 LAB — CBC
HCT: 37 % — ABNORMAL LOW (ref 39.0–52.0)
Hemoglobin: 10.4 g/dL — ABNORMAL LOW (ref 13.0–17.0)
MCH: 26.4 pg (ref 26.0–34.0)
MCHC: 28.1 g/dL — ABNORMAL LOW (ref 30.0–36.0)
MCV: 93.9 fL (ref 80.0–100.0)
Platelets: 160 10*3/uL (ref 150–400)
RBC: 3.94 MIL/uL — ABNORMAL LOW (ref 4.22–5.81)
RDW: 23.8 % — ABNORMAL HIGH (ref 11.5–15.5)
WBC: 10.7 10*3/uL — ABNORMAL HIGH (ref 4.0–10.5)
nRBC: 0.2 % (ref 0.0–0.2)

## 2020-12-05 LAB — PROTIME-INR
INR: 1.4 — ABNORMAL HIGH (ref 0.8–1.2)
Prothrombin Time: 16.7 seconds — ABNORMAL HIGH (ref 11.4–15.2)

## 2020-12-05 LAB — RENAL FUNCTION PANEL
Albumin: 3.2 g/dL — ABNORMAL LOW (ref 3.5–5.0)
Anion gap: 6 (ref 5–15)
BUN: 49 mg/dL — ABNORMAL HIGH (ref 8–23)
CO2: 25 mmol/L (ref 22–32)
Calcium: 8.6 mg/dL — ABNORMAL LOW (ref 8.9–10.3)
Chloride: 100 mmol/L (ref 98–111)
Creatinine, Ser: 2.86 mg/dL — ABNORMAL HIGH (ref 0.61–1.24)
GFR, Estimated: 23 mL/min — ABNORMAL LOW (ref >60)
Glucose, Bld: 157 mg/dL — ABNORMAL HIGH (ref 70–99)
Phosphorus: 4.7 mg/dL — ABNORMAL HIGH (ref 2.5–4.6)
Potassium: 4.5 mmol/L (ref 3.5–5.1)
Sodium: 131 mmol/L — ABNORMAL LOW (ref 135–145)

## 2020-12-05 LAB — GLUCOSE, CAPILLARY
Glucose-Capillary: 134 mg/dL — ABNORMAL HIGH (ref 70–99)
Glucose-Capillary: 147 mg/dL — ABNORMAL HIGH (ref 70–99)
Glucose-Capillary: 161 mg/dL — ABNORMAL HIGH (ref 70–99)
Glucose-Capillary: 263 mg/dL — ABNORMAL HIGH (ref 70–99)

## 2020-12-05 LAB — MAGNESIUM: Magnesium: 2.6 mg/dL — ABNORMAL HIGH (ref 1.7–2.4)

## 2020-12-05 LAB — HEPARIN LEVEL (UNFRACTIONATED): Heparin Unfractionated: 0.26 IU/mL — ABNORMAL LOW (ref 0.30–0.70)

## 2020-12-05 MED ORDER — PENTAFLUOROPROP-TETRAFLUOROETH EX AERO: 1.0000 "application " | INHALATION_SPRAY | CUTANEOUS | Status: DC | PRN

## 2020-12-05 MED ORDER — SODIUM CHLORIDE 0.9 % IV SOLN: 100.0000 mL | INTRAVENOUS | Status: DC | PRN

## 2020-12-05 MED ORDER — HEPARIN SODIUM (PORCINE) 1000 UNIT/ML DIALYSIS
1000.0000 [IU] | INTRAMUSCULAR | Status: DC | PRN
  Filled 2020-12-05: qty 1

## 2020-12-05 MED ORDER — LIDOCAINE HCL (PF) 1 % IJ SOLN: 5.0000 mL | INTRAMUSCULAR | Status: DC | PRN

## 2020-12-05 MED ORDER — LIDOCAINE-PRILOCAINE 2.5-2.5 % EX CREA
1.0000 "application " | TOPICAL_CREAM | CUTANEOUS | Status: DC | PRN
  Filled 2020-12-05: qty 5

## 2020-12-05 MED ORDER — ALTEPLASE 2 MG IJ SOLR
2.0000 mg | Freq: Once | INTRAMUSCULAR | Status: DC | PRN
  Filled 2020-12-05: qty 2

## 2020-12-05 NOTE — Progress Notes (Signed)
[name] NEPHROLOGY PROGRESS NOTE  Assessment/ Plan:  # Acute kidney injury on chronic kidney disease stage IIIb: Likely hemodynamically mediated in the setting of acute exacerbation of congestive heart failure.  Started CRRT for fluid overload refractory to diuretics which was a stopped on 12/2 after optimization of volume.  Plan to attempt intermittent HD today as his blood pressure is acceptable with midodrine.  Not on IV pressors.   He remains anuric without sign of renal recovery.  If he tolerates intermittent HD then we will plan outpatient HD arrangement for AKI.  Renal navigator is already following.  #  Acute exacerbation of diastolic heart failure: With severe pulmonary hypertension and significant third spacing/volume overload.  Volume status improved with CRRT.  Cardiology is following.  Continue fluid restriction.  # Encephalopathy: Likely multifactorial with hypercapnic respiratory failure/CO2 narcosis and azotemia contributing to possible hepatic encephalopathy with slightly elevated ammonia levels.  Mental status improved..  #  Anemia of chronic illness: He has severe iron deficiency with an iron saturation of 4% and ferritin of 31.  Treated with IV iron and received ESA on 11/27.  Monitor hemoglobin and transfuse as needed.  # Chronic atrial fibrillation: Developed A. fib with RVR managing with amiodarone.    #  Thrombocytopenia: Likely from splenic sequestration in the setting of cirrhosis, no evidence of bleeding diathesis.  Platelet count improved.  #Hypophosphatemia:  It is due to CRRT.  Replete phosphorus.  Now managed with dialysis.  #Hypotension: Increased midodrine to 15 mg 3 times daily.  Blood pressure acceptable range today.  Subjective: Seen and examined in ICU.  Currently on CPAP.  No urine output.  Denies nausea, vomiting, chest pain, shortness of breath.  No new event. Objective Vital signs in last 24 hours: Vitals:   12/05/20 0417 12/05/20  0500 12/05/20 0600 12/05/20 0700  BP:  124/74 121/71   Pulse: 75 71 62 69  Resp: 15 15 14 13   Temp:      TempSrc:      SpO2: 100% 100% 100% 100%  Weight: 110.8 kg     Height:       Weight change: 3.9 kg  Intake/Output Summary (Last 24 hours) at 12/05/2020 0717 Last data filed at 12/05/2020 0700 Gross per 24 hour  Intake 1662.2 ml  Output 675 ml  Net 987.2 ml        Labs: Basic Metabolic Panel: Recent Labs  Lab 12/04/20 0418 12/04/20 1745 12/05/20 0410  NA 134* 132* 131*  K 4.2 4.6 4.5  CL 102 99 100  CO2 27 24 25   GLUCOSE 115* 279* 157*  BUN 26* 38* 49*  CREATININE 1.81* 2.63* 2.86*  CALCIUM 8.7* 8.5* 8.6*  PHOS 1.9* 5.2* 4.7*    Liver Function Tests: Recent Labs  Lab 12/04/20 0418 12/04/20 1745 12/05/20 0410  ALBUMIN 3.3* 3.3* 3.2*    No results for input(s): LIPASE, AMYLASE in the last 168 hours. No results for input(s): AMMONIA in the last 168 hours.  CBC: Recent Labs  Lab 12/01/20 0330 12/02/20 0458 12/03/20 0504 12/04/20 0418 12/05/20 0410  WBC 3.9* 3.6* 6.7 8.4 10.7*  HGB 8.4* 9.3* 9.4* 10.1* 10.4*  HCT 31.0* 34.5* 35.1* 36.0* 37.0*  MCV 94.2 95.3 96.2 94.2 93.9  PLT 113* 122* 136* 155 160    Cardiac Enzymes: No results for input(s): CKTOTAL, CKMB, CKMBINDEX, TROPONINI in the last 168 hours. CBG: Recent Labs  Lab 12/04/20 0631 12/04/20 0734 12/04/20 1118 12/04/20 1743 12/04/20 2140  GLUCAP 116*  111* 152* 304* 169*     Iron Studies: No results for input(s): IRON, TIBC, TRANSFERRIN, FERRITIN in the last 72 hours. Studies/Results: No results found.  Medications: Infusions:  sodium chloride 5 mL/hr at 12/05/20 0700   heparin 1,850 Units/hr (12/05/20 0700)    Scheduled Medications:  (feeding supplement) PROSource Plus  30 mL Oral TID BM   allopurinol  300 mg Oral Daily   amiodarone  200 mg Oral BID   atorvastatin  20 mg Oral Daily   B-complex with vitamin C  1 tablet Oral Daily   Chlorhexidine Gluconate Cloth  6 each  Topical Daily   Chlorhexidine Gluconate Cloth  6 each Topical Q0600   darbepoetin (ARANESP) injection - NON-DIALYSIS  100 mcg Subcutaneous Q Sun-1800   insulin aspart  0-5 Units Subcutaneous QHS   insulin aspart  0-9 Units Subcutaneous TID WC   insulin glargine-yfgn  16 Units Subcutaneous QHS   lactulose  20 g Oral BID   mouth rinse  15 mL Mouth Rinse BID   midodrine  15 mg Oral TID WC   pantoprazole  40 mg Oral Daily   polyethylene glycol  17 g Oral Daily   predniSONE  20 mg Oral Q breakfast   sildenafil  20 mg Oral TID   sodium chloride flush  10-40 mL Intracatheter Q12H   Zinc Oxide   Topical BID    have reviewed scheduled and prn medications.  Physical Exam: General: Pleasant male, lying in bed with CPAP on Heart:RRR, s1s2 nl Lungs: Some rhonchi probably because of CPAP, no increased work of breathing Abdomen:soft, Non-tender, non-distended Extremities: Wrapped with bandages in both legs, edema is much better now. Dialysis Access: temp HD catheter   William Flores 12/05/2020,7:17 AM  LOS: 13 days

## 2020-12-05 NOTE — Progress Notes (Signed)
William Flores for warfarin > heparin while on CRRT Indication: atrial fibrillation  Allergies  Allergen Reactions   Codeine     Headache     Patient Measurements: Height: 5' 7"  (170.2 cm) Weight: 110.8 kg (244 lb 4.3 oz) IBW/kg (Calculated) : 66.1  Vital Signs: Temp: 97.8 F (36.6 C) (12/03 0700) Temp Source: Axillary (12/03 0700) BP: 121/71 (12/03 0600) Pulse Rate: 66 (12/03 0727)  Labs: Recent Labs    12/03/20 0504 12/03/20 1000 12/04/20 0418 12/04/20 1745 12/05/20 0410  HGB 9.4*  --  10.1*  --  10.4*  HCT 35.1*  --  36.0*  --  37.0*  PLT 136*  --  155  --  160  LABPROT 16.8*  --  16.4*  --  16.7*  INR 1.4*  --  1.3*  --  1.4*  HEPARINUNFRC 0.26*  --  0.29*  --  0.26*  CREATININE 1.95*   < > 1.81* 2.63* 2.86*   < > = values in this interval not displayed.     Estimated Creatinine Clearance: 29.4 mL/min (A) (by C-G formula based on SCr of 2.86 mg/dL (H)).   Medical History: Past Medical History:  Diagnosis Date   CAD (coronary artery disease)    LHC 2/08:  mRCA 50%, o/w no sig CAD, EF 25%   Cardiomyopathy ([place])    EF 35-40% in 2/16 in setting of AF with RVR >> echo 5/16 with improved LVF with EF 65-70%   Chronic atrial fibrillation (HCC)    Chronic diastolic heart failure (HCC)    HFPF - EF ~65-70% (OFTEN EXACERBATED BY AFIB)   CKD (chronic kidney disease)    hx of worsening renal failure and hyperK+ in setting of acute diast CHF >> required CVVHD in 4/16 and dialysis x 1 in 5/16   CKD stage 3 due to type 2 diabetes mellitus ([place]) 09/17/2015   Colon cancer ([place])    Colon polyps 09/21/2012   Tubular adenoma   Diabetes ([place])    Hyperlipidemia    Hypertension    Obesity hypoventilation syndrome ([place])    Renal insufficiency    Sleep apnea     Medications:   sodium chloride 5 mL/hr at 12/05/20 0700   heparin 1,850 Units/hr (12/05/20 0700)     Assessment: 55 yoM on warfarin PTA for hx AFib admitted for RV failure  and AKI ultimately started on CRRT and IV heparin for anticoagulation. Patient recently admitted here in October for GI bleed and per GI recommended INR 2-2.5.  Heparin subtherapeutic at 0.26, CBC remains low but is stable. Planning for apixaban once procedures complete.  Goal of Therapy:  Heparin Level goal 0.3-0.5 INR 2-2.5 Monitor platelets by anticoagulation protocol: Yes   Plan:  Increase heparin to 1950 units/h Daily heparin level and CBC F/u plans to transition to Eliquis once HD access, etc are completed.  Copay is now affordable at $9.85.   Arrie Senate, PharmD, BCPS, [facility] Clinical Pharmacist [phone] Please check AMION for all [place] numbers 12/05/2020

## 2020-12-05 NOTE — Progress Notes (Signed)
Progress Note  Patient Name: William Flores Date of Encounter: 12/05/2020  [facility] Cardiologist: Minus Breeding, MD / Aundra Dubin   Subjective   70 yo admitted with pulmonary htn, RV failure,  Is net negative 20 liters during this admission  Now has anuria Plan is for intermittent HD  Has chronic AF  He is stable, off milrinone at present   No complaints today    Inpatient Medications    Scheduled Meds:  (feeding supplement) PROSource Plus  30 mL Oral TID BM   allopurinol  300 mg Oral Daily   amiodarone  200 mg Oral BID   atorvastatin  20 mg Oral Daily   B-complex with vitamin C  1 tablet Oral Daily   Chlorhexidine Gluconate Cloth  6 each Topical Daily   Chlorhexidine Gluconate Cloth  6 each Topical Q0600   darbepoetin (ARANESP) injection - NON-DIALYSIS  100 mcg Subcutaneous Q Sun-1800   insulin aspart  0-5 Units Subcutaneous QHS   insulin aspart  0-9 Units Subcutaneous TID WC   insulin glargine-yfgn  16 Units Subcutaneous QHS   lactulose  20 g Oral BID   mouth rinse  15 mL Mouth Rinse BID   midodrine  15 mg Oral TID WC   pantoprazole  40 mg Oral Daily   polyethylene glycol  17 g Oral Daily   predniSONE  20 mg Oral Q breakfast   sildenafil  20 mg Oral TID   sodium chloride flush  10-40 mL Intracatheter Q12H   Zinc Oxide   Topical BID   Continuous Infusions:  sodium chloride 5 mL/hr at 12/05/20 0700   heparin 1,850 Units/hr (12/05/20 0700)   PRN Meds: sodium chloride, acetaminophen, alum & mag hydroxide-simeth, guaiFENesin-dextromethorphan, heparin, heparin, hydrocortisone cream, hydrOXYzine, lip balm, melatonin, Muscle Rub, oxyCODONE, sodium chloride flush   Vital Signs    Vitals:   12/05/20 0500 12/05/20 0600 12/05/20 0700 12/05/20 0727  BP: 124/74 121/71    Pulse: 71 62 69 66  Resp: 15 14 13 17   Temp:   97.8 F (36.6 C)   TempSrc:   Axillary   SpO2: 100% 100% 100% 99%  Weight:      Height:        Intake/Output Summary (Last 24 hours) at  12/05/2020 0936 Last data filed at 12/05/2020 4854 Gross per 24 hour  Intake 1138.6 ml  Output 233 ml  Net 905.6 ml   Last 3 Weights 12/05/2020 12/04/2020 12/03/2020  Weight (lbs) 244 lb 4.3 oz 235 lb 10.8 oz 236 lb 8.9 oz  Weight (kg) 110.8 kg 106.9 kg 107.3 kg      Telemetry    Afib - Personally Reviewed  ECG     - Personally Reviewed  Physical Exam   GEN: middle age male, NAD  Neck: No JVD Cardiac: irreg. Irreg.   Respiratory: Clear to auscultation bilaterally. GI: Soft, nontender, non-distended  MS: + edema, legs are wrapped.  Neuro:  Nonfocal  Psych: Normal affect   Labs    High Sensitivity Troponin:  No results for input(s): TROPONINIHS in the last 720 hours.   Chemistry Recent Labs  Lab 12/03/20 1000 12/03/20 1712 12/04/20 0418 12/04/20 1745 12/05/20 0410  NA 132*   < > 134* 132* 131*  K 4.4   < > 4.2 4.6 4.5  CL 102   < > 102 99 100  CO2 24   < > 27 24 25   GLUCOSE 180*   < > 115* 279* 157*  BUN  22   < > 26* 38* 49*  CREATININE 1.82*   < > 1.81* 2.63* 2.86*  CALCIUM 8.2*   < > 8.7* 8.5* 8.6*  MG 2.4  --  2.6*  --  2.6*  ALBUMIN  --    < > 3.3* 3.3* 3.2*  GFRNONAA 40*   < > 40* 26* 23*  ANIONGAP 6   < > 5 9 6    < > = values in this interval not displayed.    Lipids No results for input(s): CHOL, TRIG, HDL, LABVLDL, LDLCALC, CHOLHDL in the last 168 hours.  Hematology Recent Labs  Lab 12/03/20 0504 12/04/20 0418 12/05/20 0410  WBC 6.7 8.4 10.7*  RBC 3.65* 3.82* 3.94*  HGB 9.4* 10.1* 10.4*  HCT 35.1* 36.0* 37.0*  MCV 96.2 94.2 93.9  MCH 25.8* 26.4 26.4  MCHC 26.8* 28.1* 28.1*  RDW 22.3* 23.6* 23.8*  PLT 136* 155 160   Thyroid No results for input(s): TSH, FREET4 in the last 168 hours.  BNP Recent Labs  Lab 12/01/20 0330  BNP 207.3*    DDimer No results for input(s): DDIMER in the last 168 hours.   Radiology    No results found.  Cardiac Studies      Patient Profile     70 y.o. male  with cor pulmonale.   Assessment & Plan      Cor pulmonale:  cont current meds.   Acute renal insufficiency:  is basically aneuric.  Plan is for intermmittent HD.  Nephrology is following .  3. Atrial fib:   cont amio for rate control . On heparin drip        For questions or updates, please contact [place] Please consult [email or web address] for contact info under        Signed, Mertie Moores, MD  12/05/2020, 9:36 AM

## 2020-12-05 NOTE — Progress Notes (Signed)
NAME:  William Flores, MRN:  [id number], DOB:  [birth year], LOS: 48 ADMISSION DATE:  11/22/2020, CONSULTATION DATE: 11/27/2020 REFERRING MD: Triad, CHIEF COMPLAINT: Volume overload renal failure  History of Present Illness:  William Flores is a 70 year old nursing home resident with acute on chronic right heart failure cor pulmonale severe pulmonary hypertension, metabolic encephalopathy, acute on chronic anemia chronic with acute respiratory failure, cirrhosis secondary to William Flores, chronic atrial fibrillation acute kidney injury, thrombocytopenia diabetes mellitus obstructive sleep apnea who presents with worsening volume overload and pulmonary critical care is asked to transfer to intensive care place a hemodialysis catheter so that he can undergo CRRT.  Pertinent  Medical History   Past Medical History:  Diagnosis Date   CAD (coronary artery disease)    LHC 2/08:  mRCA 50%, o/w no sig CAD, EF 25%   Cardiomyopathy ([place])    EF 35-40% in 2/16 in setting of AF with RVR >> echo 5/16 with improved LVF with EF 65-70%   Chronic atrial fibrillation (HCC)    Chronic diastolic heart failure (HCC)    HFPF - EF ~65-70% (OFTEN EXACERBATED BY AFIB)   CKD (chronic kidney disease)    hx of worsening renal failure and hyperK+ in setting of acute diast CHF >> required CVVHD in 4/16 and dialysis x 1 in 5/16   CKD stage 3 due to type 2 diabetes mellitus ([place]) 09/17/2015   Colon cancer ([place])    Colon polyps 09/21/2012   Tubular adenoma   Diabetes ([place])    Hyperlipidemia    Hypertension    Obesity hypoventilation syndrome ([place])    Renal insufficiency    Sleep apnea     Significant Hospital Events: Including procedures, antibiotic start and stop dates in addition to other pertinent events   11/27/2020 consult to pulmonary critical care 11/27/2020 transfer to intensive care unit place hemodialysis catheter 11/28 was cleared by SLP fro regular diet.  11/29 midodrine increased 11/30 off milrinone, and NE.  Pulling UF -150 and tolerating. Up in chair and not on oxygen. Mental status intact.  12/2 transitioning to IHD  Interim History / Subjective:  No voiced complaints.  Remains off vasopressors and on 2 L only.  For initial HD session today.  Objective   Blood pressure 122/72, pulse 87, temperature 97.8 F (36.6 C), temperature source Oral, resp. rate 20, height 5' 7"  (1.702 m), weight 110.8 kg, SpO2 100 %. CVP:  [3 mmHg-10 mmHg] 4 mmHg  FiO2 (%):  [40 %] 40 %   Intake/Output Summary (Last 24 hours) at 12/05/2020 1408 Last data filed at 12/05/2020 8185 Gross per 24 hour  Intake 638.8 ml  Output 1 ml  Net 637.8 ml    Filed Weights   12/03/20 0600 12/04/20 0629 12/05/20 0417  Weight: 107.3 kg 106.9 kg 110.8 kg    Examination: General : elderly man laying in bed.  No acute distress HENT [place] AT, eyes anicteric Neck: RIJ CVC w/o erythema Pulm breathing comfortably, on 2 L nasal cannula.  Crackles clear with coughing at bases  CV: RRR,  Ext: stasis dermatitis, legs wrapped, edema minimal otherwise. No clubbing or cyanosis. Abd obese, soft, NT  Neuro awake, alert, moving all extremities, answering questions appropriately Derm: warm, dry  Resolved Hospital Problem list     Assessment & Plan:  Acute on chronic systolic right heart failure with cor pulmonale and severe pulmonary hypertension with hypervolemia. Acute on chronic hypoxic/hypercapnic respiratory failure OSA Likely OHS Chronic A. Fib with rapid ventricular response,  now rate controlled AKI on CKD stage IIIb, Anuric BPH Cirrhosis (cardiac?)  With elevated ammonia level status post paracentesis with 2.4 L removed Pruritis Anemia of chronic disease, kidney disease Chronic thrombocytopenia, likely 2/2 liver failure->stable,  Diabetes mellitus- uncontrolled hyperglycemia with steroids GERD Hand pain-possible pseudogout gout?  Plan:  -Wean oxygen off.  Incentive spirometry progressive ambulation. -Continue CPAP at  nighttime for OSA -Appears euvolemic: We will transition to intermittent dialysis.  Long-term prognosis dependent on ability to tolerate IHD. -If able to tolerate IHD will need permanent vascular access established. -Continue oral amiodarone for atrial fibrillation.  Can consider transition to warfarin for stroke prevention. -Continue lactulose, titrate to 2-3 soft bowel movements per day -Complete short course steroids for possible pseudogout. -Glucose well controlled on current regimen. -Transfer to telemetry if able to tolerate HD today.  Best Practice (right click and "Reselect all SmartList Selections" daily)   Diet/type: Regular consistency (see orders)  DVT prophylaxis: heparin GI prophylaxis: PPI Lines: Dialysis Catheter Foley:  N/A Code Status:  full code Last date of multidisciplinary goals of care discussion. Continue palliative involvement pending trajectory.    William Brood, MD [facility] ICU Physician [place]  Pager: [phone] Mobile: [phone] After hours: [phone].

## 2020-12-06 DIAGNOSIS — I4891 Unspecified atrial fibrillation: Secondary | ICD-10-CM | POA: Diagnosis not present

## 2020-12-06 DIAGNOSIS — I5023 Acute on chronic systolic (congestive) heart failure: Secondary | ICD-10-CM | POA: Diagnosis not present

## 2020-12-06 LAB — RENAL FUNCTION PANEL
Albumin: 3.3 g/dL — ABNORMAL LOW (ref 3.5–5.0)
Anion gap: 8 (ref 5–15)
BUN: 38 mg/dL — ABNORMAL HIGH (ref 8–23)
CO2: 27 mmol/L (ref 22–32)
Calcium: 8.6 mg/dL — ABNORMAL LOW (ref 8.9–10.3)
Chloride: 97 mmol/L — ABNORMAL LOW (ref 98–111)
Creatinine, Ser: 2.44 mg/dL — ABNORMAL HIGH (ref 0.61–1.24)
GFR, Estimated: 28 mL/min — ABNORMAL LOW (ref >60)
Glucose, Bld: 141 mg/dL — ABNORMAL HIGH (ref 70–99)
Phosphorus: 4.5 mg/dL (ref 2.5–4.6)
Potassium: 4.3 mmol/L (ref 3.5–5.1)
Sodium: 132 mmol/L — ABNORMAL LOW (ref 135–145)

## 2020-12-06 LAB — GLUCOSE, CAPILLARY
Glucose-Capillary: 140 mg/dL — ABNORMAL HIGH (ref 70–99)
Glucose-Capillary: 174 mg/dL — ABNORMAL HIGH (ref 70–99)
Glucose-Capillary: 141 mg/dL — ABNORMAL HIGH (ref 70–99)
Glucose-Capillary: 187 mg/dL — ABNORMAL HIGH (ref 70–99)
Glucose-Capillary: 231 mg/dL — ABNORMAL HIGH (ref 70–99)

## 2020-12-06 LAB — CBC
HCT: 38.4 % — ABNORMAL LOW (ref 39.0–52.0)
Hemoglobin: 10.8 g/dL — ABNORMAL LOW (ref 13.0–17.0)
MCH: 26.2 pg (ref 26.0–34.0)
MCHC: 28.1 g/dL — ABNORMAL LOW (ref 30.0–36.0)
MCV: 93 fL (ref 80.0–100.0)
Platelets: 171 10*3/uL (ref 150–400)
RBC: 4.13 MIL/uL — ABNORMAL LOW (ref 4.22–5.81)
RDW: 24.2 % — ABNORMAL HIGH (ref 11.5–15.5)
WBC: 11.2 10*3/uL — ABNORMAL HIGH (ref 4.0–10.5)
nRBC: 0 % (ref 0.0–0.2)

## 2020-12-06 LAB — PROTIME-INR
INR: 1.4 — ABNORMAL HIGH (ref 0.8–1.2)
Prothrombin Time: 16.7 seconds — ABNORMAL HIGH (ref 11.4–15.2)

## 2020-12-06 LAB — HEPARIN LEVEL (UNFRACTIONATED): Heparin Unfractionated: 0.4 IU/mL (ref 0.30–0.70)

## 2020-12-06 LAB — MAGNESIUM: Magnesium: 2.2 mg/dL (ref 1.7–2.4)

## 2020-12-06 MED ORDER — CHLORHEXIDINE GLUCONATE CLOTH 2 % EX PADS
6.0000 | MEDICATED_PAD | Freq: Every day | CUTANEOUS | Status: DC
  Administered 2020-12-06 – 2020-12-11 (×6): 6 via TOPICAL

## 2020-12-06 NOTE — Progress Notes (Addendum)
[facility] NEPHROLOGY PROGRESS NOTE  Assessment/ Plan:  # Acute kidney injury on chronic kidney disease stage IIIb: Likely hemodynamically mediated in the setting of acute exacerbation of congestive heart failure.  Started CRRT for fluid overload refractory to diuretics which was a stopped on 12/2 after optimization of volume.  First intermittent HD yesterday and he tolerated well with 2.5 L of ultrafiltration.  Remains anuric without sign of renal recovery.  No need for dialysis today and will plan for another HD tomorrow.  Since he tolerated HD yesterday we will plan to convert temporary HD catheter to tunneled HD catheter. Renal navigator is already aware of possible need of outpatient HD for AKI.  #  Acute exacerbation of diastolic heart failure: With severe pulmonary hypertension and significant third spacing/volume overload.  Volume status improved with CRRT.  Cardiology is following.  Continue fluid restriction.  # Encephalopathy: Likely multifactorial with hypercapnic respiratory failure/CO2 narcosis and azotemia contributing to possible hepatic encephalopathy with slightly elevated ammonia levels.  Mental status improved..  #  Anemia of chronic illness: He has severe iron deficiency with an iron saturation of 4% and ferritin of 31.  Treated with IV iron and received ESA on 11/27.  Monitor hemoglobin and transfuse as needed.  # Chronic atrial fibrillation: Developed A. fib with RVR managing with amiodarone.    #  Thrombocytopenia: Likely from splenic sequestration in the setting of cirrhosis, no evidence of bleeding diathesis.  Platelet count improved.  #Hypophosphatemia:  It was due to CRRT.  Phosphorus level acceptable today.  #Hypotension: Increased midodrine to 15 mg 3 times daily.  Blood pressure acceptable range today.  Subjective: Seen and examined in ICU.  He had intermittent HD yesterday with around 2.5 L of ultrafiltration, tolerated well.  No issue.  He denies  nausea, vomiting, chest pain, shortness of breath.  Feels tired.  No urine output. Objective Vital signs in last 24 hours: Vitals:   12/06/20 0600 12/06/20 0614 12/06/20 0700 12/06/20 0800  BP: 125/74  124/74 108/61  Pulse: 71  86 70  Resp: 13  (!) 22 18  Temp:  (!) 97.4 F (36.3 C)    TempSrc:  Axillary    SpO2: 100%  (!) 88% 93%  Weight:      Height:       Weight change: 0.1 kg  Intake/Output Summary (Last 24 hours) at 12/06/2020 0901 Last data filed at 12/06/2020 0700 Gross per 24 hour  Intake 518.62 ml  Output 2500 ml  Net -1981.38 ml        Labs: Basic Metabolic Panel: Recent Labs  Lab 12/04/20 1745 12/05/20 0410 12/06/20 0411  NA 132* 131* 132*  K 4.6 4.5 4.3  CL 99 100 97*  CO2 24 25 27   GLUCOSE 279* 157* 141*  BUN 38* 49* 38*  CREATININE 2.63* 2.86* 2.44*  CALCIUM 8.5* 8.6* 8.6*  PHOS 5.2* 4.7* 4.5    Liver Function Tests: Recent Labs  Lab 12/04/20 1745 12/05/20 0410 12/06/20 0411  ALBUMIN 3.3* 3.2* 3.3*    No results for input(s): LIPASE, AMYLASE in the last 168 hours. No results for input(s): AMMONIA in the last 168 hours.  CBC: Recent Labs  Lab 12/02/20 0458 12/03/20 0504 12/04/20 0418 12/05/20 0410 12/06/20 0411  WBC 3.6* 6.7 8.4 10.7* 11.2*  HGB 9.3* 9.4* 10.1* 10.4* 10.8*  HCT 34.5* 35.1* 36.0* 37.0* 38.4*  MCV 95.3 96.2 94.2 93.9 93.0  PLT 122* 136* 155 160 171    Cardiac Enzymes: No  results for input(s): CKTOTAL, CKMB, CKMBINDEX, TROPONINI in the last 168 hours. CBG: Recent Labs  Lab 12/05/20 1106 12/05/20 1712 12/05/20 2130 12/06/20 0611 12/06/20 0802  GLUCAP 147* 161* 263* 140* 174*     Iron Studies: No results for input(s): IRON, TIBC, TRANSFERRIN, FERRITIN in the last 72 hours. Studies/Results: No results found.  Medications: Infusions:  sodium chloride Stopped (12/05/20 1754)   sodium chloride     sodium chloride     heparin 1,950 Units/hr (12/06/20 0840)    Scheduled Medications:  (feeding  supplement) PROSource Plus  30 mL Oral TID BM   allopurinol  300 mg Oral Daily   amiodarone  200 mg Oral BID   atorvastatin  20 mg Oral Daily   B-complex with vitamin C  1 tablet Oral Daily   Chlorhexidine Gluconate Cloth  6 each Topical Daily   Chlorhexidine Gluconate Cloth  6 each Topical Q0600   darbepoetin (ARANESP) injection - NON-DIALYSIS  100 mcg Subcutaneous Q Sun-1800   insulin aspart  0-5 Units Subcutaneous QHS   insulin aspart  0-9 Units Subcutaneous TID WC   insulin glargine-yfgn  16 Units Subcutaneous QHS   lactulose  20 g Oral BID   mouth rinse  15 mL Mouth Rinse BID   midodrine  15 mg Oral TID WC   pantoprazole  40 mg Oral Daily   polyethylene glycol  17 g Oral Daily   sildenafil  20 mg Oral TID   sodium chloride flush  10-40 mL Intracatheter Q12H   Zinc Oxide   Topical BID    have reviewed scheduled and prn medications.  Physical Exam: General: Pleasant male lying on bed, not in distress Heart:RRR, s1s2 nl Lungs: Decreased breath sound bilateral, no increased work of breathing Abdomen:soft, Non-tender, non-distended Extremities: Wrapped with bandages in both legs, edema is much better now. Dialysis Access: temp HD catheter   William Flores Tanna Furry 12/06/2020,9:01 AM  LOS: 14 days

## 2020-12-06 NOTE — Progress Notes (Signed)
[place] for warfarin > heparin while on CRRT Indication: atrial fibrillation  Allergies  Allergen Reactions   Codeine     Headache     Patient Measurements: Height: 5' 7"  (170.2 cm) Weight: 108.5 kg (239 lb 3.2 oz) IBW/kg (Calculated) : 66.1  Vital Signs: Temp: 97.4 F (36.3 C) (12/04 0614) Temp Source: Axillary (12/04 0614) BP: 124/74 (12/04 0700) Pulse Rate: 86 (12/04 0700)  Labs: Recent Labs    12/04/20 0418 12/04/20 1745 12/05/20 0410 12/06/20 0411  HGB 10.1*  --  10.4* 10.8*  HCT 36.0*  --  37.0* 38.4*  PLT 155  --  160 171  LABPROT 16.4*  --  16.7* 16.7*  INR 1.3*  --  1.4* 1.4*  HEPARINUNFRC 0.29*  --  0.26* 0.40  CREATININE 1.81* 2.63* 2.86* 2.44*     Estimated Creatinine Clearance: 34.1 mL/min (A) (by C-G formula based on SCr of 2.44 mg/dL (H)).   Medical History: Past Medical History:  Diagnosis Date   CAD (coronary artery disease)    LHC 2/08:  mRCA 50%, o/w no sig CAD, EF 25%   Cardiomyopathy ([place])    EF 35-40% in 2/16 in setting of AF with RVR >> echo 5/16 with improved LVF with EF 65-70%   Chronic atrial fibrillation (HCC)    Chronic diastolic heart failure (HCC)    HFPF - EF ~65-70% (OFTEN EXACERBATED BY AFIB)   CKD (chronic kidney disease)    hx of worsening renal failure and hyperK+ in setting of acute diast CHF >> required CVVHD in 4/16 and dialysis x 1 in 5/16   CKD stage 3 due to type 2 diabetes mellitus ([place]) 09/17/2015   Colon cancer ([place])    Colon polyps 09/21/2012   Tubular adenoma   Diabetes ([place])    Hyperlipidemia    Hypertension    Obesity hypoventilation syndrome ([place])    Renal insufficiency    Sleep apnea     Medications:   sodium chloride Stopped (12/05/20 1754)   sodium chloride     sodium chloride     heparin 1,950 Units/hr (12/06/20 0700)     Assessment: 59 yoM on warfarin PTA for hx AFib admitted for RV failure and AKI ultimately started on CRRT and IV heparin for  anticoagulation. Patient recently admitted here in October for GI bleed and per GI recommended INR 2-2.5.  Heparin therapeutic, CBC remains stable. Planning apixaban at discharge.  Goal of Therapy:  Heparin Level goal 0.3-0.5 Monitor platelets by anticoagulation protocol: Yes   Plan:  Continue heparin 1950 units/h Daily heparin level and CBC F/u plans to transition to Eliquis once HD access, etc are completed.  Copay is now affordable at $9.85.   Arrie Senate, PharmD, BCPS, [facility] Clinical Pharmacist [phone] Please check AMION for all [place] numbers 12/06/2020

## 2020-12-06 NOTE — Progress Notes (Signed)
NAME:  William Flores, MRN:  [id number], DOB:  04/25/1950, LOS: 75 ADMISSION DATE:  11/22/2020, CONSULTATION DATE: 11/27/2020 REFERRING MD: [facility], CHIEF COMPLAINT: Volume overload renal failure  History of Present Illness:  William Flores is a 70 year old nursing home resident with acute on chronic right heart failure cor pulmonale severe pulmonary hypertension, metabolic encephalopathy, acute on chronic anemia chronic with acute respiratory failure, cirrhosis secondary to William Flores, chronic atrial fibrillation acute kidney injury, thrombocytopenia diabetes mellitus obstructive sleep apnea who presents with worsening volume overload and pulmonary critical care is asked to transfer to intensive care place a hemodialysis catheter so that he can undergo CRRT.  Pertinent  Medical History   Past Medical History:  Diagnosis Date   CAD (coronary artery disease)    LHC 2/08:  mRCA 50%, o/w no sig CAD, EF 25%   Cardiomyopathy ([place])    EF 35-40% in 2/16 in setting of AF with RVR >> echo 5/16 with improved LVF with EF 65-70%   Chronic atrial fibrillation (HCC)    Chronic diastolic heart failure (HCC)    HFPF - EF ~65-70% (OFTEN EXACERBATED BY AFIB)   CKD (chronic kidney disease)    hx of worsening renal failure and hyperK+ in setting of acute diast CHF >> required CVVHD in 4/16 and dialysis x 1 in 5/16   CKD stage 3 due to type 2 diabetes mellitus (William Flores) 09/17/2015   Colon cancer (William Flores)    Colon polyps 09/21/2012   Tubular adenoma   Diabetes (William Flores)    Hyperlipidemia    Hypertension    Obesity hypoventilation syndrome (William Flores)    Renal insufficiency    Sleep apnea     Significant Hospital Events: Including procedures, antibiotic start and stop dates in addition to other pertinent events   11/27/2020 consult to pulmonary critical care 11/27/2020 transfer to intensive care unit place hemodialysis catheter 11/28 was cleared by SLP fro regular diet.  11/29 midodrine increased 11/30 off milrinone, and NE.  Pulling UF -150 and tolerating. Up in chair and not on oxygen. Mental status intact.  12/2 transitioning to IHD  Interim History / Subjective:  Tolerated HD yesterday.  Objective   Blood pressure 124/74, pulse 86, temperature (!) 97.4 F (36.3 C), temperature source Axillary, resp. rate (!) 22, height 5' 7"  (1.702 m), weight 108.5 kg, SpO2 (!) 88 %. CVP:  [7 mmHg-16 mmHg] 8 mmHg      Intake/Output Summary (Last 24 hours) at 12/06/2020 0805 Last data filed at 12/06/2020 0700 Gross per 24 hour  Intake 518.62 ml  Output 2501 ml  Net -1982.38 ml    Filed Weights   12/05/20 0417 12/05/20 1329 12/06/20 0500  Weight: 110.8 kg 110.9 kg 108.5 kg    Examination: General : elderly man laying in bed.  No acute distress HENT [place] AT, eyes anicteric Neck: RIJ CVC w/o erythema Pulm breathing comfortably, on 2 L nasal cannula.  Crackles clear with coughing at bases  CV: RRR,  Ext: stasis dermatitis, legs wrapped, edema minimal otherwise. No clubbing or cyanosis. Abd obese, soft, NT  Neuro awake, alert, moving all extremities, answering questions appropriately Derm: warm, dry  Resolved Hospital Problem list     Assessment & Plan:  Acute on chronic systolic right heart failure with cor pulmonale and severe pulmonary hypertension with hypervolemia. Acute on chronic hypoxic/hypercapnic respiratory failure OSA Likely OHS Chronic A. Fib with rapid ventricular response, now rate controlled AKI on CKD stage IIIb, Anuric BPH Cirrhosis (cardiac?)  With elevated ammonia level  status post paracentesis with 2.4 L removed Pruritis Anemia of chronic disease, kidney disease Chronic thrombocytopenia, likely 2/2 liver failure->stable,  Diabetes mellitus- uncontrolled hyperglycemia with steroids GERD Hand pain-possible pseudogout gout?  Plan:  -Wean oxygen off.  Incentive spirometry progressive ambulation. -Continue CPAP at nighttime for OSA -Appears euvolemic: We will transition to intermittent  dialysis.  Long-term prognosis dependent on ability to tolerate IHD. -Permcath placement ordered for tomorrow.  -Continue oral amiodarone for atrial fibrillation.  Can consider transition to warfarin for stroke prevention. -Continue lactulose, titrate to 2-3 soft bowel movements per day -Complete short course steroids for possible pseudogout. -Glucose well controlled on current regimen. -Transfer to telemetry today, orders reconciled and TRH notified.  Best Practice (right click and "Reselect all SmartList Selections" daily)   Diet/type: Regular consistency (see orders)  DVT prophylaxis: heparin GI prophylaxis: PPI Lines: Dialysis Catheter Foley:  N/A Code Status:  full code Last date of multidisciplinary goals of care discussion. Continue palliative involvement pending trajectory.   35 min spent with >50% of time in counseling and coordination of care.   Kipp Brood, MD [facility] ICU Physician [place]  Pager: [phone] Mobile: [phone] After hours: [phone].

## 2020-12-07 ENCOUNTER — Telehealth: Payer: Self-pay | Admitting: Family Medicine

## 2020-12-07 DIAGNOSIS — K7031 Alcoholic cirrhosis of liver with ascites: Secondary | ICD-10-CM | POA: Diagnosis not present

## 2020-12-07 DIAGNOSIS — Z7901 Long term (current) use of anticoagulants: Secondary | ICD-10-CM | POA: Diagnosis not present

## 2020-12-07 DIAGNOSIS — I5023 Acute on chronic systolic (congestive) heart failure: Secondary | ICD-10-CM | POA: Diagnosis not present

## 2020-12-07 DIAGNOSIS — I482 Chronic atrial fibrillation, unspecified: Secondary | ICD-10-CM | POA: Diagnosis not present

## 2020-12-07 LAB — RENAL FUNCTION PANEL
Albumin: 3.3 g/dL — ABNORMAL LOW (ref 3.5–5.0)
Anion gap: 9 (ref 5–15)
BUN: 74 mg/dL — ABNORMAL HIGH (ref 8–23)
CO2: 24 mmol/L (ref 22–32)
Calcium: 8.5 mg/dL — ABNORMAL LOW (ref 8.9–10.3)
Chloride: 95 mmol/L — ABNORMAL LOW (ref 98–111)
Creatinine, Ser: 3.8 mg/dL — ABNORMAL HIGH (ref 0.61–1.24)
GFR, Estimated: 17 mL/min — ABNORMAL LOW (ref >60)
Glucose, Bld: 160 mg/dL — ABNORMAL HIGH (ref 70–99)
Phosphorus: 5.3 mg/dL — ABNORMAL HIGH (ref 2.5–4.6)
Potassium: 4.6 mmol/L (ref 3.5–5.1)
Sodium: 128 mmol/L — ABNORMAL LOW (ref 135–145)

## 2020-12-07 LAB — PROTIME-INR
INR: 1.9 — ABNORMAL HIGH (ref 0.8–1.2)
Prothrombin Time: 21.4 seconds — ABNORMAL HIGH (ref 11.4–15.2)

## 2020-12-07 LAB — CBC
HCT: 36.8 % — ABNORMAL LOW (ref 39.0–52.0)
Hemoglobin: 10.7 g/dL — ABNORMAL LOW (ref 13.0–17.0)
MCH: 26.7 pg (ref 26.0–34.0)
MCHC: 29.1 g/dL — ABNORMAL LOW (ref 30.0–36.0)
MCV: 91.8 fL (ref 80.0–100.0)
Platelets: 169 10*3/uL (ref 150–400)
RBC: 4.01 MIL/uL — ABNORMAL LOW (ref 4.22–5.81)
RDW: 23.9 % — ABNORMAL HIGH (ref 11.5–15.5)
WBC: 12 10*3/uL — ABNORMAL HIGH (ref 4.0–10.5)
nRBC: 0 % (ref 0.0–0.2)

## 2020-12-07 LAB — GLUCOSE, CAPILLARY
Glucose-Capillary: 164 mg/dL — ABNORMAL HIGH (ref 70–99)
Glucose-Capillary: 117 mg/dL — ABNORMAL HIGH (ref 70–99)
Glucose-Capillary: 141 mg/dL — ABNORMAL HIGH (ref 70–99)

## 2020-12-07 LAB — HEPATITIS B SURFACE ANTIBODY, QUANTITATIVE: Hep B S AB Quant (Post): 11.1 m[IU]/mL (ref >9.9)

## 2020-12-07 LAB — HEPARIN LEVEL (UNFRACTIONATED)
Heparin Unfractionated: >1.1 IU/mL — ABNORMAL HIGH (ref 0.30–0.70)
Heparin Unfractionated: 0.42 IU/mL (ref 0.30–0.70)

## 2020-12-07 MED ORDER — HEPARIN SODIUM (PORCINE) 1000 UNIT/ML DIALYSIS
1000.0000 [IU] | INTRAMUSCULAR | Status: DC | PRN
  Administered 2020-12-07: 1000 [IU] via INTRAVENOUS_CENTRAL
  Filled 2020-12-07: qty 1

## 2020-12-07 MED ORDER — LIDOCAINE-PRILOCAINE 2.5-2.5 % EX CREA: 1.0000 "application " | TOPICAL_CREAM | CUTANEOUS | Status: DC | PRN

## 2020-12-07 MED ORDER — LIDOCAINE HCL (PF) 1 % IJ SOLN: 5.0000 mL | INTRAMUSCULAR | Status: DC | PRN

## 2020-12-07 MED ORDER — SODIUM CHLORIDE 0.9 % IV SOLN: 100.0000 mL | INTRAVENOUS | Status: DC | PRN

## 2020-12-07 MED ORDER — PENTAFLUOROPROP-TETRAFLUOROETH EX AERO: 1.0000 "application " | INHALATION_SPRAY | CUTANEOUS | Status: DC | PRN

## 2020-12-07 MED ORDER — ALTEPLASE 2 MG IJ SOLR: 2.0000 mg | Freq: Once | INTRAMUSCULAR | Status: DC | PRN

## 2020-12-07 MED ORDER — METOPROLOL SUCCINATE ER 25 MG PO TB24
25.0000 mg | ORAL_TABLET | Freq: Every day | ORAL | Status: DC
  Administered 2020-12-07 – 2020-12-10 (×3): 25 mg via ORAL
  Filled 2020-12-07 (×4): qty 1

## 2020-12-07 NOTE — Consult Note (Signed)
   [facility] Inpatient Consult   12/07/2020  William Flores June 17, 1950 [id number]  [facility] [ACO] Patient: Medicare CMS DCE  Primary Care Provider:  [name], [name], MD, [place], is an embedded provider with a Chronic Care Management team and program, and is listed for the transition of care follow up and appointments.  Patient was screened for Embedded practice service needs for chronic care management and reviewed inpatient HF CSW notes regarding post hospital follow up needs. Patient is currently for a SNF   Plan: Follow up on TOC needs for post hospital needs. If patient transitions to a skilled nursing facility then his post hospital transitional needs are to be met at that level of care.   Please contact for further questions,  Natividad Brood, RN BSN [facility] Liaison  [phone] business mobile phone Toll free office [phone]  Fax number: [phone] [email or web address]

## 2020-12-07 NOTE — Telephone Encounter (Signed)
Pts daughter returned missed call stating that pt is still in the hospital due to psoriasis of the liver and stage 4 kidney failure. Says she doesn't know when he will get out because he is hooked up to dialysis almost 24/7. But says once he is released she will call to schedule follow up.   Can call Daughter [name]) if needed. [phone]

## 2020-12-07 NOTE — Progress Notes (Signed)
Met with pt at bedside to discuss out-pt HD arrangements. Pt advised that [facility] can transport pt to [facility] and [facility]. Pt prefers [facility]. Referral faxed to [facility] admissions this afternoon. Contacted nephrologist regarding need for Hep B Total Core Antibody lab that is required by [facility]. Discussed out-pt HD with pt and will f/u with pt and TOC staff once clinic/days/time known. Will assist.   Melven Sartorius Renal Navigator [phone]

## 2020-12-07 NOTE — Progress Notes (Signed)
PT Cancellation Note  Patient Details Name: William Flores MRN: [id number] DOB: 01-13-1950   Cancelled Treatment:    Reason Eval/Treat Not Completed: Patient at procedure or test/unavailable (Pt in HD currently.  Will return as able.)   [name] 12/07/2020, 8:18 AM [name] M,PT Acute Rehab Services [phone] (pager)

## 2020-12-07 NOTE — Progress Notes (Signed)
[place] for warfarin > heparin while on CRRT Indication: atrial fibrillation  Allergies  Allergen Reactions   Codeine     Headache     Patient Measurements: Height: 5' 7"  (170.2 cm) Weight: 104.1 kg (229 lb 8 oz) IBW/kg (Calculated) : 66.1  Vital Signs: Temp: 98.2 F (36.8 C) (12/05 1109) Temp Source: Oral (12/05 1109) BP: 126/68 (12/05 1109) Pulse Rate: 60 (12/05 1109)  Labs: Recent Labs    12/05/20 0410 12/06/20 0411 12/07/20 0430 12/07/20 0555 12/07/20 0649  HGB 10.4* 10.8* 10.7*  --   --   HCT 37.0* 38.4* 36.8*  --   --   PLT 160 171 169  --   --   LABPROT 16.7* 16.7* 21.4*  --   --   INR 1.4* 1.4* 1.9*  --   --   HEPARINUNFRC 0.26* 0.40 >1.10* 0.42  --   CREATININE 2.86* 2.44*  --   --  3.80*     Estimated Creatinine Clearance: 21.4 mL/min (A) (by C-G formula based on SCr of 3.8 mg/dL (H)).   Medical History: Past Medical History:  Diagnosis Date   CAD (coronary artery disease)    LHC 2/08:  mRCA 50%, o/w no sig CAD, EF 25%   Cardiomyopathy ([place])    EF 35-40% in 2/16 in setting of AF with RVR >> echo 5/16 with improved LVF with EF 65-70%   Chronic atrial fibrillation (HCC)    Chronic diastolic heart failure (HCC)    HFPF - EF ~65-70% (OFTEN EXACERBATED BY AFIB)   CKD (chronic kidney disease)    hx of worsening renal failure and hyperK+ in setting of acute diast CHF >> required CVVHD in 4/16 and dialysis x 1 in 5/16   CKD stage 3 due to type 2 diabetes mellitus ([place]) 09/17/2015   Colon cancer ([place])    Colon polyps 09/21/2012   Tubular adenoma   Diabetes ([employer] [place])    Hyperlipidemia    Hypertension    Obesity hypoventilation syndrome ([place])    Renal insufficiency    Sleep apnea     Medications:   sodium chloride Stopped (12/05/20 1754)   heparin 1,950 Units/hr (12/06/20 2132)     Assessment: 38 yoM on warfarin PTA for hx AFib admitted for RV failure and AKI ultimately started on CRRT and IV heparin for  anticoagulation. Patient recently admitted here in October for GI bleed and per GI recommended INR 2-2.5.  Heparin therapeutic on 1950 units/hr, CBC remains stable. Planning apixaban at discharge.   Goal of Therapy:  Heparin Level goal 0.3-0.5 Monitor platelets by anticoagulation protocol: Yes   Plan:  Continue heparin 1950 units/h Daily heparin level and CBC F/u plans to transition to Eliquis once HD access, etc are completed.  Copay is now affordable at $9.85.  Erin Hearing PharmD., BCPS Clinical Pharmacist 12/07/2020 1:39 PM

## 2020-12-07 NOTE — Progress Notes (Signed)
[facility] NEPHROLOGY PROGRESS NOTE  Assessment/ Plan:  # Acute kidney injury on chronic kidney disease stage IIIb: Likely hemodynamically mediated in the setting of acute exacerbation of congestive heart failure.  Started CRRT for fluid overload refractory to diuretics which was a stopped on 12/2 after optimization of volume.  First intermittent HD 12/3.  Remains anuric without sign of renal recovery. Will plan to convert temporary HD catheter to tunneled HD catheter w/ IR, consult placed. Next HD 12/7. Renal navigator is already aware of possible need of outpatient HD for AKI.  #  Acute exacerbation of diastolic heart failure: With severe pulmonary hypertension and significant third spacing/volume overload.  Volume status improved with CRRT, now on IHD.  Cardiology is following.  Continue fluid restriction.  # Encephalopathy: Likely multifactorial with hypercapnic respiratory failure/CO2 narcosis and azotemia contributing to possible hepatic encephalopathy with slightly elevated ammonia levels.  Mental status improved..  #  Anemia of chronic illness: He has severe iron deficiency with an iron saturation of 4% and ferritin of 31.  Treated with IV iron and now on ESA.  Monitor hemoglobin and transfuse as needed.  # Chronic atrial fibrillation: Developed A. fib with RVR managing with amiodarone.    #  Thrombocytopenia: Likely from splenic sequestration in the setting of cirrhosis, no evidence of bleeding diathesis.  Platelet count improved.  #CKD MBD:  Had hypophos due to CRRT.  Phosphorus level acceptable today. Trending up now, watch, low phos diet. May need to be started on binders soon  #Hypotension: midodrine to 15 mg 3 times daily.  Blood pressure acceptable range today.  Subjective: Seen and examined on HD. No acute events. UFG 3000 Objective Vital signs in last 24 hours: Vitals:   12/07/20 0000 12/07/20 0309 12/07/20 0712 12/07/20 0729  BP: 129/74 126/73 120/64  116/73  Pulse: (!) 47 74 63 72  Resp: 19 (!) 22 20   Temp: (!) 97.3 F (36.3 C) (!) 97.4 F (36.3 C) 98.2 F (36.8 C)   TempSrc: Oral Oral Oral   SpO2: 95% 96% 98%   Weight:  108.1 kg 107.2 kg   Height:       Weight change: -2.8 kg  Intake/Output Summary (Last 24 hours) at 12/07/2020 0904 Last data filed at 12/07/2020 0300 Gross per 24 hour  Intake 464.92 ml  Output --  Net 464.92 ml       Labs: Basic Metabolic Panel: Recent Labs  Lab 12/05/20 0410 12/06/20 0411 12/07/20 0649  NA 131* 132* 128*  K 4.5 4.3 4.6  CL 100 97* 95*  CO2 25 27 24   GLUCOSE 157* 141* 160*  BUN 49* 38* 74*  CREATININE 2.86* 2.44* 3.80*  CALCIUM 8.6* 8.6* 8.5*  PHOS 4.7* 4.5 5.3*   Liver Function Tests: Recent Labs  Lab 12/05/20 0410 12/06/20 0411 12/07/20 0649  ALBUMIN 3.2* 3.3* 3.3*   No results for input(s): LIPASE, AMYLASE in the last 168 hours. No results for input(s): AMMONIA in the last 168 hours.  CBC: Recent Labs  Lab 12/03/20 0504 12/04/20 0418 12/05/20 0410 12/06/20 0411 12/07/20 0430  WBC 6.7 8.4 10.7* 11.2* 12.0*  HGB 9.4* 10.1* 10.4* 10.8* 10.7*  HCT 35.1* 36.0* 37.0* 38.4* 36.8*  MCV 96.2 94.2 93.9 93.0 91.8  PLT 136* 155 160 171 169   Cardiac Enzymes: No results for input(s): CKTOTAL, CKMB, CKMBINDEX, TROPONINI in the last 168 hours. CBG: Recent Labs  Lab 12/06/20 0802 12/06/20 1126 12/06/20 1534 12/06/20 2119 12/07/20 0554  GLUCAP 174*  141* 187* 231* 164*    Iron Studies: No results for input(s): IRON, TIBC, TRANSFERRIN, FERRITIN in the last 72 hours. Studies/Results: No results found.  Medications: Infusions:  sodium chloride Stopped (12/05/20 1754)   sodium chloride     sodium chloride     heparin 1,950 Units/hr (12/06/20 2132)    Scheduled Medications:  (feeding supplement) PROSource Plus  30 mL Oral TID BM   allopurinol  300 mg Oral Daily   amiodarone  200 mg Oral BID   atorvastatin  20 mg Oral Daily   B-complex with vitamin C  1  tablet Oral Daily   Chlorhexidine Gluconate Cloth  6 each Topical Q0600   Chlorhexidine Gluconate Cloth  6 each Topical Q0600   darbepoetin (ARANESP) injection - NON-DIALYSIS  100 mcg Subcutaneous Q Sun-1800   insulin aspart  0-5 Units Subcutaneous QHS   insulin aspart  0-9 Units Subcutaneous TID WC   insulin glargine-yfgn  16 Units Subcutaneous QHS   lactulose  20 g Oral BID   mouth rinse  15 mL Mouth Rinse BID   midodrine  15 mg Oral TID WC   pantoprazole  40 mg Oral Daily   polyethylene glycol  17 g Oral Daily   sildenafil  20 mg Oral TID   sodium chloride flush  10-40 mL Intracatheter Q12H   Zinc Oxide   Topical BID    have reviewed scheduled and prn medications.  Physical Exam: General: NAD Heart:RRR, s1s2 nl Lungs: Decreased breath sound bilateral, no increased work of breathing Abdomen:soft, Non-tender, non-distended Extremities: Wrapped with bandages in both legs, edema is much better now. Dialysis Access: temp HD catheter   William Flores 12/07/2020,9:04 AM  LOS: 15 days

## 2020-12-07 NOTE — Progress Notes (Signed)
Patient refused BIPAP for HS. States he is feeling nausea and doesn't want to wear at this time.

## 2020-12-07 NOTE — Telephone Encounter (Signed)
Pt has been in hospital since 11/22/20

## 2020-12-07 NOTE — Progress Notes (Addendum)
Patient ID: William Flores, male   DOB: August 12, 1950, 70 y.o.   MRN: [id number]    Advanced Heart Failure Rounding Note   Subjective:    Remains anuric. Transitioned to [facility] on 12/3 and tolerated well.   Tolerated iHD yesterday w/ 3L volume removal.   Nauseated overnight. No vomiting. Slightly improved this am but tired and no appetite for breakfast. Denies dyspnea. No pain. No labs drawn today.    Objective:     Vital Signs:   Temp:  [97.3 F (36.3 C)-98.2 F (36.8 C)] 98.2 F (36.8 C) (12/05 0712) Pulse Rate:  [47-81] 72 (12/05 0729) Resp:  [17-23] 20 (12/05 0712) BP: (104-139)/(59-91) 116/73 (12/05 0729) SpO2:  [91 %-98 %] 98 % (12/05 0712) Weight:  [107.2 kg-108.1 kg] 107.2 kg (12/05 0712) Last BM Date: 12/06/20  Weight change: Filed Weights   12/06/20 0500 12/07/20 0309 12/07/20 0712  Weight: 108.5 kg 108.1 kg 107.2 kg    Intake/Output:   Intake/Output Summary (Last 24 hours) at 12/07/2020 0823 Last data filed at 12/07/2020 0300 Gross per 24 hour  Intake 503.96 ml  Output --  Net 503.96 ml   PHYSICAL EXAM: General:  chronically ill/fatigued appearing. No respiratory difficulty HEENT: normal Neck: supple. JVD to jaw.+Rt IJ CVC, Carotids 2+ bilat; no bruits. No lymphadenopathy or thyromegaly appreciated. Cor: PMI nondisplaced. Irregularly irregular rhythm. No rubs, gallops or murmurs. Lungs: clear Abdomen: soft, nontender, nondistended. No hepatosplenomegaly. No bruits or masses. Good bowel sounds. Extremities: no cyanosis, clubbing, rash, trace bilateral LE edema, bilateral unna boots  Neuro: alert & oriented x 3, cranial nerves grossly intact. moves all 4 extremities w/o difficulty. Affect pleasant.   Telemetry: Afib 60s-70s   Labs: Basic Metabolic Panel: Recent Labs  Lab 12/03/20 0504 12/03/20 1000 12/03/20 1712 12/04/20 0418 12/04/20 1745 12/05/20 0410 12/06/20 0411 12/07/20 0649  NA 132* 132*   < > 134* 132* 131* 132* 128*  K 4.4 4.4   < > 4.2  4.6 4.5 4.3 4.6  CL 100 102   < > 102 99 100 97* 95*  CO2 27 24   < > _0 GLUCOSE 144* 180*   < > 115* 279* 157* 141* 160*  BUN 22 22   < > 26* 38* 49* 38* 74*  CREATININE 1.95* 1.82*   < > 1.81* 2.63* 2.86* 2.44* 3.80*  CALCIUM 8.6* 8.2*   < > 8.7* 8.5* 8.6* 8.6* 8.5*  MG 2.4 2.4  --  2.6*  --  2.6* 2.2  --   PHOS 2.1*  --    < > 1.9* 5.2* 4.7* 4.5 5.3*   < > = values in this interval not displayed.    Liver Function Tests: Recent Labs  Lab 12/04/20 0418 12/04/20 1745 12/05/20 0410 12/06/20 0411 12/07/20 0649  ALBUMIN 3.3* 3.3* 3.2* 3.3* 3.3*   No results for input(s): LIPASE, AMYLASE in the last 168 hours. No results for input(s): AMMONIA in the last 168 hours.   CBC: Recent Labs  Lab 12/03/20 0504 12/04/20 0418 12/05/20 0410 12/06/20 0411 12/07/20 0430  WBC 6.7 8.4 10.7* 11.2* 12.0*  HGB 9.4* 10.1* 10.4* 10.8* 10.7*  HCT 35.1* 36.0* 37.0* 38.4* 36.8*  MCV 96.2 94.2 93.9 93.0 91.8  PLT 136* 155 160 171 169    Cardiac Enzymes: No results for input(s): CKTOTAL, CKMB, CKMBINDEX, TROPONINI in the last 168 hours.  BNP: BNP (last 3 results) Recent Labs    11/20/20 1128 11/22/20 0425 12/01/20 0330  BNP 306.1* 270.0* 207.3*    ProBNP (last 3 results) No results for input(s): PROBNP in the last 8760 hours.    Other results:  Imaging: No results found.   Medications:     Scheduled Medications:  (feeding supplement) PROSource Plus  30 mL Oral TID BM   allopurinol  300 mg Oral Daily   amiodarone  200 mg Oral BID   atorvastatin  20 mg Oral Daily   B-complex with vitamin C  1 tablet Oral Daily   Chlorhexidine Gluconate Cloth  6 each Topical Q0600   Chlorhexidine Gluconate Cloth  6 each Topical Q0600   darbepoetin (ARANESP) injection - NON-DIALYSIS  100 mcg Subcutaneous Q Sun-1800   insulin aspart  0-5 Units Subcutaneous QHS   insulin aspart  0-9 Units Subcutaneous TID WC   insulin glargine-yfgn  16 Units Subcutaneous QHS   lactulose  20  g Oral BID   mouth rinse  15 mL Mouth Rinse BID   midodrine  15 mg Oral TID WC   pantoprazole  40 mg Oral Daily   polyethylene glycol  17 g Oral Daily   sildenafil  20 mg Oral TID   sodium chloride flush  10-40 mL Intracatheter Q12H   Zinc Oxide   Topical BID    Infusions:  sodium chloride Stopped (12/05/20 1754)   sodium chloride     sodium chloride     heparin 1,950 Units/hr (12/06/20 2132)    PRN Medications: sodium chloride, sodium chloride, sodium chloride, acetaminophen, alteplase, alum & mag hydroxide-simeth, guaiFENesin-dextromethorphan, heparin, hydrocortisone cream, hydrOXYzine, lidocaine (PF), lidocaine-prilocaine, lip balm, melatonin, Muscle Rub, oxyCODONE, pentafluoroprop-tetrafluoroeth, sodium chloride flush   Assessment/Plan:    1. Cor pulmonale/RV failure: Suspect primarily due to OHS/OSA. Echo in 4/21 with EF 55-60%, RV severely dilated w/ severe RV dysfunction in the setting of severe pulmonary hypertension w/ PASP 76 mmHg. [place] 4/21 with elevated filling pressures, mixed pulmonary venous/pulmonary hypertension=> diuresed w/ IV Lasix in hospital and placed on po torsemide. Echo in 11/22 showed EF 55-60%, mod LVH, D-shaped septum, severe RV enlargement with severely decreased RV systolic function, IVC dilated. NYHA class IV. Now off milrinone and NE. Co-ox 63>>45%>>58%. Transitioned of CVVH to iHD on 12/3. Tolerating iHD well so far.  - Continue iHD per nephrology  - Continues midodrine 15 mg tid for BP support - c/w unna boots  2. Pulmonary HTN: PASP elevated at 76 mmHg by 4/21 echo estimate with RV systolic function severely reduced. Most likely poorly controlled OHS/OSA as cause (group 3 PH).  V/Q scan negative for chronic PE.  Mixed PVH/PAH on RHC.  Echo in 10/22 with severe RV failure and again this admission.   - Continue sildenafil 20 mg tid.  - Continue home oxygen and CPAP at night.    3. AKI on Stage IIIb CKD>>ESRD: Anuric and now HD dependent.  - iHD per  nephrology - planning to convert from temp HD cath to tunneled HD cath today w/ IR  - Renal navigator is already aware of possible need of outpatient HD  4. Atrial fibrillation: Chronic, unlikely to be able to successfully cardiovert given long-standing atrial fibrillation.   - Now off amio - continue Toprol XL 25 mg daily  - Continue heparin gtt until perm HD cath placed then transition to Eliquis 5 bid  5. PVCs - suppressed - continue Toprol XL  - Keep K > 4 and Mag > 2 6. OSA/OHS: Currently on [place].  At home, CPAP + oxygen.  7. Diabetes: SSI 8. MGUS: Previously followed by hem/onc but has not seen in several years.  Multiple myeloma panel 4/21 with presence of M-spike. 9. Cirrhosis: ?Due to NAFLD.  11/23 Ammonia 48. Paracentesis 2.4 L this admission. Mental status improved.  - Continue lactulose.  10. Anemia: Hgb 9.4>10.1>10.7. No obvious source of bleeding.  Has had IV Fe.   11. Encephalopathy: Suspect CO2 narcosis + possible component hepatic encephalopathy with elevated NH3.  Alert/oriented this morning, overall improved.    - He remains on lactulose.  12. Thrombocytopenia: In setting of acute inflammatory illness and possibly splenic sequestration with cirrhosis. Plts were low before heparin gtt started. Improved from 110s-169  13. Rt Hand pain: Middle digit MCP joint. H/o gout. Pain feels similar.  Improved w/ prednisone.  14. Hypervolemic Hyponatremia: - iHD per nephrology  15. GOC: Lives in SNF, not ideal long-term HD candidate.  Suspect end stage RV failure. Palliative care saw 11/28. For now has elected to remain full code.    Lyda Jester, PA-C  12/07/2020 8:23 AM

## 2020-12-07 NOTE — Progress Notes (Signed)
Progress Note    William Flores  VVZ:[id number] DOB: June 08, 1950  DOA: 11/22/2020 PCP: [name], [name], MD    Brief Narrative:     Medical records reviewed and are as summarized below:  William Flores is an 70 y.o. male nursing home resident with acute on chronic right heart failure cor pulmonale severe pulmonary hypertension, metabolic encephalopathy, acute on chronic anemia chronic with acute respiratory failure, cirrhosis secondary to [facility], chronic atrial fibrillation acute kidney injury, thrombocytopenia diabetes mellitus obstructive sleep apnea who presents with worsening volume overload and pulmonary critical care is asked to transfer to intensive care place a hemodialysis catheter so that he can undergo CRRT. Transferred to [facility] on 12/5.  Plan is for tunnel HD cath on Wednesday if WBC count stable.  11/27/2020 consult to pulmonary critical care 11/27/2020 transfer to intensive care unit place hemodialysis catheter 11/30 off milrinone, and NE 12/2 transitioning to HD  Assessment/Plan:   Principal Problem:   Acute exacerbation of CHF (congestive heart failure) (HCC) Active Problems:   Right heart failure (HCC)   OSA (obstructive sleep apnea)   Anticoagulated on Coumadin   Atrial fibrillation with RVR (HCC)   DM (diabetes mellitus), type 2 with renal complications (HCC)   Chronic respiratory failure with hypoxia (HCC)   Essential hypertension   CKD (chronic kidney disease), stage III (HCC)   Mixed hyperlipidemia   Morbid obesity with BMI of 45.0-49.9, adult ([place])   Pulmonary hypertension (HCC)   Liver cirrhosis secondary to NASH (HCC)   Elevated brain natriuretic peptide (BNP) level   Thrombocytopenia (HCC)   GERD (gastroesophageal reflux disease)   Insomnia   Pressure injury of skin   Cor pulmonale/RV failure:  -cards Suspects primarily due to OHS/OSA.  -Echo in 4/21 with EF 55-60%, RV severely dilated w/ severe RV dysfunction in the setting of severe pulmonary  hypertension - Continue HD per nephrology  - Continues midodrine 15 mg tid for BP support - unna boots    Pulmonary HTN:  -per cHF team - Continue sildenafil 20 mg tid.  - Continue home oxygen and CPAP at night.     AKI on Stage IIIb CKD>>ESRD:  -Anuric and now HD dependent.  - HD per nephrology - planning to convert from temp HD cath to tunneled HD cath Wednesday  Atrial fibrillation: Chronic, unlikely to be able to successfully cardiovert given long-standing atrial fibrillation.   - Continue po amiodarone for rate control for now with soft BP, eventually back to Toprol XL.  - Continue heparin gtt, until tunneled cath placed (plan for Wednesday)  Diabetes: type 2 -SSI  MGUS: Previously followed by hem/onc but has not seen in several years.  Multiple myeloma panel 4/21 with presence of M-spike.  Cirrhosis: ?Due to NAFLD.  11/23 Ammonia 48. Paracentesis 2.4 L this admission. Mental status improved.  - Continue lactulose.    Anemia: Hgb 9.4>10.1>10.7. No obvious source of bleeding.  Has had IV Fe.    Hand pain-possible pseudogout gout? -? Source of increasing WBC count as this was treated with prednisone  obesity Body mass index is 35.94 kg/m.   Family Communication/Anticipated D/C date and plan/Code Status   DVT prophylaxis: heparin Code Status: Full Code.  Disposition Plan: Status is: Inpatient  Remains inpatient appropriate because: needs SNF         Medical Consultants:   Renal CHF team PCCM Palliative care IR  Subjective:   Back from HD, hungry  Objective:    Vitals:  12/07/20 1030 12/07/20 1100 12/07/20 1107 12/07/20 1109  BP: 119/63 121/66 134/74 126/68  Pulse: 65 64 68 60  Resp:    19  Temp:    98.2 F (36.8 C)  TempSrc:    Oral  SpO2:    100%  Weight:    104.1 kg  Height:        Intake/Output Summary (Last 24 hours) at 12/07/2020 1603 Last data filed at 12/07/2020 1109 Gross per 24 hour  Intake 347.89 ml  Output 3000 ml  Net  -2652.11 ml   Filed Weights   12/07/20 0309 12/07/20 0712 12/07/20 1109  Weight: 108.1 kg 107.2 kg 104.1 kg    Exam:  General: Appearance:    Obese male in no acute distress- temp catheter in neck     Lungs:     respirations unlabored  Heart:    Normal heart rate.  MS:   All extremities are intact.    Neurologic:   Awake, alert     Data Reviewed:   I have personally reviewed following labs and imaging studies:  Labs: Labs show the following:   Basic Metabolic Panel: Recent Labs  Lab 12/03/20 0504 12/03/20 1000 12/03/20 1712 12/04/20 0418 12/04/20 1745 12/05/20 0410 12/06/20 0411 12/07/20 0649  NA 132* 132*   < > 134* 132* 131* 132* 128*  K 4.4 4.4   < > 4.2 4.6 4.5 4.3 4.6  CL 100 102   < > 102 99 100 97* 95*  CO2 27 24   < > 27 24 25 27 24   GLUCOSE 144* 180*   < > 115* 279* 157* 141* 160*  BUN 22 22   < > 26* 38* 49* 38* 74*  CREATININE 1.95* 1.82*   < > 1.81* 2.63* 2.86* 2.44* 3.80*  CALCIUM 8.6* 8.2*   < > 8.7* 8.5* 8.6* 8.6* 8.5*  MG 2.4 2.4  --  2.6*  --  2.6* 2.2  --   PHOS 2.1*  --    < > 1.9* 5.2* 4.7* 4.5 5.3*   < > = values in this interval not displayed.   GFR Estimated Creatinine Clearance: 21.4 mL/min (A) (by C-G formula based on SCr of 3.8 mg/dL (H)). Liver Function Tests: Recent Labs  Lab 12/04/20 0418 12/04/20 1745 12/05/20 0410 12/06/20 0411 12/07/20 0649  ALBUMIN 3.3* 3.3* 3.2* 3.3* 3.3*   No results for input(s): LIPASE, AMYLASE in the last 168 hours. No results for input(s): AMMONIA in the last 168 hours. Coagulation profile Recent Labs  Lab 12/03/20 0504 12/04/20 0418 12/05/20 0410 12/06/20 0411 12/07/20 0430  INR 1.4* 1.3* 1.4* 1.4* 1.9*    CBC: Recent Labs  Lab 12/03/20 0504 12/04/20 0418 12/05/20 0410 12/06/20 0411 12/07/20 0430  WBC 6.7 8.4 10.7* 11.2* 12.0*  HGB 9.4* 10.1* 10.4* 10.8* 10.7*  HCT 35.1* 36.0* 37.0* 38.4* 36.8*  MCV 96.2 94.2 93.9 93.0 91.8  PLT 136* 155 160 171 169   Cardiac Enzymes: No  results for input(s): CKTOTAL, CKMB, CKMBINDEX, TROPONINI in the last 168 hours. BNP (last 3 results) No results for input(s): PROBNP in the last 8760 hours. CBG: Recent Labs  Lab 12/06/20 0802 12/06/20 1126 12/06/20 1534 12/06/20 2119 12/07/20 0554  GLUCAP 174* 141* 187* 231* 164*   D-Dimer: No results for input(s): DDIMER in the last 72 hours. Hgb A1c: No results for input(s): HGBA1C in the last 72 hours. Lipid Profile: No results for input(s): CHOL, HDL, LDLCALC, TRIG, CHOLHDL, LDLDIRECT in the last 72  hours. Thyroid function studies: No results for input(s): TSH, T4TOTAL, T3FREE, THYROIDAB in the last 72 hours.  Invalid input(s): FREET3 Anemia work up: No results for input(s): VITAMINB12, FOLATE, FERRITIN, TIBC, IRON, RETICCTPCT in the last 72 hours. Sepsis Labs: Recent Labs  Lab 12/04/20 0418 12/05/20 0410 12/06/20 0411 12/07/20 0430  WBC 8.4 10.7* 11.2* 12.0*    Microbiology No results found for this or any previous visit (from the past 240 hour(s)).  Procedures and diagnostic studies:  No results found.  Medications:    (feeding supplement) PROSource Plus  30 mL Oral TID BM   allopurinol  300 mg Oral Daily   atorvastatin  20 mg Oral Daily   B-complex with vitamin C  1 tablet Oral Daily   Chlorhexidine Gluconate Cloth  6 each Topical Q0600   Chlorhexidine Gluconate Cloth  6 each Topical Q0600   darbepoetin (ARANESP) injection - NON-DIALYSIS  100 mcg Subcutaneous Q Sun-1800   insulin aspart  0-5 Units Subcutaneous QHS   insulin aspart  0-9 Units Subcutaneous TID WC   insulin glargine-yfgn  16 Units Subcutaneous QHS   lactulose  20 g Oral BID   mouth rinse  15 mL Mouth Rinse BID   metoprolol succinate  25 mg Oral Daily   midodrine  15 mg Oral TID WC   pantoprazole  40 mg Oral Daily   polyethylene glycol  17 g Oral Daily   sildenafil  20 mg Oral TID   sodium chloride flush  10-40 mL Intracatheter Q12H   Zinc Oxide   Topical BID   Continuous  Infusions:  sodium chloride Stopped (12/05/20 1754)   heparin 1,950 Units/hr (12/06/20 2132)     LOS: 15 days   Geradine Girt  [facility]   How to contact the [facility] Attending or Consulting provider [place] or covering provider during after hours [place], for this patient?  Check the care team in [facility] and look for a) attending/consulting TRH provider listed and b) the [facility] team listed Log into [email or web address] and use [place]'s universal password to access. If you do not have the password, please contact the hospital operator. Locate the [facility] provider you are looking for under [facility] and page to a number that you can be directly reached. If you still have difficulty reaching the provider, please page the [facility] (Director on Call) for the Hospitalists listed on amion for assistance.  12/07/2020, 4:03 PM

## 2020-12-07 NOTE — Progress Notes (Signed)
Physical Therapy Treatment Patient Details Name: William Flores MRN: [id number] DOB: [birth year] Today's Date: 12/07/2020   History of Present Illness Pt is a 70 y.o. male admitted from [facility] on 40/98/11 with acute exacerbation of CHF, severe pulmonary hypertension. S/p R paracentesis on 11/22. CRRT initiated 11/25, now on IHD 12/06/20. PMH includes a-fib, HTN, DM, CKD stage III, CAD, HLD, sleep apnea, cardiomyopathy, cirrhosis, acute on chronic respiratory failure (2L O2 baseline), obesity.    PT Comments    Patient s/p HD today and feeling fatigue so declined OOB mobility this session.  Able to roll and assist with scooting to [facility] and bridged up to allow bed pad change.  Feel he should progress well when able to participate possibly on non-HD day.  PT will continue to follow.    Recommendations for follow up therapy are one component of a multi-disciplinary discharge planning process, led by the attending physician.  Recommendations may be updated based on patient status, additional functional criteria and insurance authorization.  Follow Up Recommendations  Skilled nursing-short term rehab (<3 hours/day)     Assistance Recommended at Discharge Frequent or constant Supervision/Assistance  Equipment Recommendations  Rolling walker (2 wheels);BSC/3in1    Recommendations for Other Services       Precautions / Restrictions Precautions Precautions: Fall;Other (comment) Precaution Comments: Watch HR, SpO2 (wears 2L O2 baseline per chart)     Mobility  Bed Mobility Overal bed mobility: Needs Assistance Bed Mobility: Rolling Rolling: Min assist         General bed mobility comments: in bed rolling to get off bed pan and for hygiene, assist for hips to stay over, using bed rails; scooing up in bed with rail and min A    Transfers                   General transfer comment: declined due to fatigue after HD    Ambulation/Gait                   Stairs              Wheelchair Mobility    Modified Rankin (Stroke Patients Only)       Balance                                            Cognition Arousal/Alertness: Awake/alert Behavior During Therapy: WFL for tasks assessed/performed Overall Cognitive Status: Within Functional Limits for tasks assessed                                          Exercises      General Comments        Pertinent Vitals/Pain Faces Pain Scale: Hurts even more Pain Location: bottom Pain Descriptors / Indicators: Aching Pain Intervention(s): Monitored during session;Repositioned    Home Living                          Prior Function            PT Goals (current goals can now be found in the care plan section) Progress towards PT goals: Not progressing toward goals - comment    Frequency    Min 2X/week      PT Plan  Current plan remains appropriate    Co-evaluation              AM-PAC PT "6 Clicks" Mobility   Outcome Measure  Help needed turning from your back to your side while in a flat bed without using bedrails?: A Little Help needed moving from lying on your back to sitting on the side of a flat bed without using bedrails?: A Little Help needed moving to and from a bed to a chair (including a wheelchair)?: Total Help needed standing up from a chair using your arms (e.g., wheelchair or bedside chair)?: Total Help needed to walk in hospital room?: Total Help needed climbing 3-5 steps with a railing? : Total 6 Click Score: 10    End of Session   Activity Tolerance: Patient limited by fatigue Patient left: in bed;with call bell/phone within reach;with bed alarm set   PT Visit Diagnosis: Other abnormalities of gait and mobility (R26.89);Muscle weakness (generalized) (M62.81)     Time: 3754-3606 PT Time Calculation (min) (ACUTE ONLY): 15 min  Charges:  $Therapeutic Activity: 8-22 mins                     Magda Kiel,  PT Acute Rehabilitation Services VPCHE:[phone] Office:[phone] 12/07/2020    Reginia Naas 12/07/2020, 2:56 PM

## 2020-12-08 DIAGNOSIS — K7581 Nonalcoholic steatohepatitis (NASH): Secondary | ICD-10-CM | POA: Diagnosis not present

## 2020-12-08 DIAGNOSIS — I5023 Acute on chronic systolic (congestive) heart failure: Secondary | ICD-10-CM | POA: Diagnosis not present

## 2020-12-08 DIAGNOSIS — K7460 Unspecified cirrhosis of liver: Secondary | ICD-10-CM | POA: Diagnosis not present

## 2020-12-08 DIAGNOSIS — I482 Chronic atrial fibrillation, unspecified: Secondary | ICD-10-CM | POA: Diagnosis not present

## 2020-12-08 LAB — RENAL FUNCTION PANEL
Albumin: 3.2 g/dL — ABNORMAL LOW (ref 3.5–5.0)
Anion gap: 9 (ref 5–15)
BUN: 59 mg/dL — ABNORMAL HIGH (ref 8–23)
CO2: 25 mmol/L (ref 22–32)
Calcium: 8.6 mg/dL — ABNORMAL LOW (ref 8.9–10.3)
Chloride: 96 mmol/L — ABNORMAL LOW (ref 98–111)
Creatinine, Ser: 3.87 mg/dL — ABNORMAL HIGH (ref 0.61–1.24)
GFR, Estimated: 16 mL/min — ABNORMAL LOW (ref >60)
Glucose, Bld: 100 mg/dL — ABNORMAL HIGH (ref 70–99)
Phosphorus: 4.3 mg/dL (ref 2.5–4.6)
Potassium: 4.4 mmol/L (ref 3.5–5.1)
Sodium: 130 mmol/L — ABNORMAL LOW (ref 135–145)

## 2020-12-08 LAB — CBC
HCT: 38 % — ABNORMAL LOW (ref 39.0–52.0)
Hemoglobin: 11 g/dL — ABNORMAL LOW (ref 13.0–17.0)
MCH: 26.8 pg (ref 26.0–34.0)
MCHC: 28.9 g/dL — ABNORMAL LOW (ref 30.0–36.0)
MCV: 92.7 fL (ref 80.0–100.0)
Platelets: 129 10*3/uL — ABNORMAL LOW (ref 150–400)
RBC: 4.1 MIL/uL — ABNORMAL LOW (ref 4.22–5.81)
RDW: 24.5 % — ABNORMAL HIGH (ref 11.5–15.5)
WBC: 12.1 10*3/uL — ABNORMAL HIGH (ref 4.0–10.5)
nRBC: 0 % (ref 0.0–0.2)

## 2020-12-08 LAB — GLUCOSE, CAPILLARY
Glucose-Capillary: 90 mg/dL (ref 70–99)
Glucose-Capillary: 87 mg/dL (ref 70–99)
Glucose-Capillary: 138 mg/dL — ABNORMAL HIGH (ref 70–99)
Glucose-Capillary: 168 mg/dL — ABNORMAL HIGH (ref 70–99)

## 2020-12-08 LAB — HEPARIN LEVEL (UNFRACTIONATED): Heparin Unfractionated: 0.46 IU/mL (ref 0.30–0.70)

## 2020-12-08 LAB — HEPATITIS B SURFACE ANTIGEN: Hepatitis B Surface Ag: NONREACTIVE

## 2020-12-08 LAB — HEPATITIS B CORE ANTIBODY, TOTAL: Hep B Core Total Ab: NONREACTIVE

## 2020-12-08 NOTE — Progress Notes (Signed)
PT Cancellation Note  Patient Details Name: William Flores MRN: [id number] DOB: December 07, 1950   Cancelled Treatment:    Reason Eval/Treat Not Completed: Patient declined stating he was too tired and wanted to rest.   [place] 12/08/2020, 4:11 PM [place] Pager [phone] Office [phone]

## 2020-12-08 NOTE — Progress Notes (Signed)
Occupational Therapy Treatment Patient Details Name: William Flores MRN: [id number] DOB: [birth year] Today's Date: 12/08/2020   History of present illness Pt is a 70 y.o. male admitted from [facility] on 62/83/15 with acute exacerbation of CHF, severe pulmonary hypertension. S/p R paracentesis on 11/22. CRRT initiated 11/25, now on IHD 12/06/20. PMH includes a-fib, HTN, DM, CKD stage III, CAD, HLD, sleep apnea, cardiomyopathy, cirrhosis, acute on chronic respiratory failure (2L O2 baseline), obesity.   OT comments  Pt seen for first OT session since switch to intermittent HD. With encouragement, pt able to demo mobility to/from bathroom using Rollator and Min A for maneuvering DME. Pt continues to require assist for LB ADLs due to fatigue and pain in R dominant hand, including toileting hygiene during session today. Encouraged OOB mobility with staff and sitting up in chair to progress OOB tolerance though pt opting to sit EOB for lunch today. Continue to rec SNF rehab at DC to maximize independence and safety with ADLs/mobility.    Recommendations for follow up therapy are one component of a multi-disciplinary discharge planning process, led by the attending physician.  Recommendations may be updated based on patient status, additional functional criteria and insurance authorization.    Follow Up Recommendations  Skilled nursing-short term rehab (<3 hours/day)    Assistance Recommended at Discharge Frequent or constant Supervision/Assistance  Equipment Recommendations  BSC/3in1    Recommendations for Other Services      Precautions / Restrictions Precautions Precautions: Fall;Other (comment) Precaution Comments: Watch SpO2 (wears 2L O2 baseline per chart) Restrictions Weight Bearing Restrictions: No       Mobility Bed Mobility Overal bed mobility: Needs Assistance Bed Mobility: Supine to Sit     Supine to sit: Min assist;HOB elevated     General bed mobility comments: Assist to  lift trunk, difficulty using bedrail with R hand d/t pain    Transfers Overall transfer level: Needs assistance Equipment used: Rollator (4 wheels);1 person hand held assist Transfers: Sit to/from Stand Sit to Stand: Min assist           General transfer comment: Initiall Min A to stand without AD, progressing to min guard with use of rollator and pushing from surfaces. Min A needed for safety in turning DME to/from toilet     Balance Overall balance assessment: Needs assistance Sitting-balance support: No upper extremity supported;Feet supported Sitting balance-Leahy Scale: Good     Standing balance support: Single extremity supported;Bilateral upper extremity supported;During functional activity Standing balance-Leahy Scale: Fair Standing balance comment: fair static standing, benefits from one or BUE support during dynamic tasks                           ADL either performed or assessed with clinical judgement   ADL Overall ADL's : Needs assistance/impaired Eating/Feeding: Set up;Sitting Eating/Feeding Details (indicate cue type and reason): assist to open containers due to R hand pain Grooming: Supervision/safety;Wash/dry hands;Sitting   Upper Body Bathing: Minimal assistance;Sitting Upper Body Bathing Details (indicate cue type and reason): to bathe back             Toilet Transfer: Minimal assistance;Ambulation;Rollator (4 wheels) Toilet Transfer Details (indicate cue type and reason): assist to navigate Rollator in small spaces and turning, assist to lock brakes, able to power up from toilet without assist Toileting- Clothing Manipulation and Hygiene: Minimal assistance;Sit to/from stand Toileting - Clothing Manipulation Details (indicate cue type and reason): able to perform posterior hygiene seated on  toilet, Min A for thoroughness     Functional mobility during ADLs: Minimal assistance;Rollator (4 wheels) General ADL Comments: focused on mobility  progression (use of Rollator as there was no RW in room or equipment closet - RN aware). able to mobilize well with expected fatigue, benefits from UE support during standing tasks and encouragement to progress abilities    Extremity/Trunk Assessment Upper Extremity Assessment Upper Extremity Assessment: RUE deficits/detail RUE Deficits / Details: new pain on R side of hand, able to make light fist and hold light items but difficulty grasping tightly RUE Coordination: decreased fine motor   Lower Extremity Assessment Lower Extremity Assessment: Defer to PT evaluation        Vision   Vision Assessment?: No apparent visual deficits   Perception     Praxis      Cognition Arousal/Alertness: Awake/alert Behavior During Therapy: WFL for tasks assessed/performed Overall Cognitive Status: Within Functional Limits for tasks assessed                                 General Comments: Pt pleasant, follows directions consistently , does benefit from cues to stay on task; does benefit from cues for problem solving          Exercises     Shoulder Instructions       General Comments VSS    Pertinent Vitals/ Pain       Pain Assessment: Faces Faces Pain Scale: Hurts even more Pain Location: R hand Pain Descriptors / Indicators: Grimacing;Guarding;Sore Pain Intervention(s): Monitored during session;Limited activity within patient's tolerance;Other (comment) (notified RN)  Home Living                                          Prior Functioning/Environment              Frequency  Min 2X/week        Progress Toward Goals  OT Goals(current goals can now be found in the care plan section)  Progress towards OT goals: Progressing toward goals  Acute Rehab OT Goals Patient Stated Goal: go to different rehab OT Goal Formulation: With patient Time For Goal Achievement: 12/14/20 Potential to Achieve Goals: Good ADL Goals Pt Will Perform Lower  Body Bathing: with set-up;sitting/lateral leans;sit to/from stand Pt Will Perform Lower Body Dressing: with set-up;sitting/lateral leans;sit to/from stand;with adaptive equipment Pt Will Transfer to Toilet: with set-up;stand pivot transfer;bedside commode Pt/caregiver will Perform Home Exercise Program: Increased strength;Both right and left upper extremity;With theraband;Independently;With written HEP provided  Plan Discharge plan remains appropriate    Co-evaluation                 AM-PAC OT "6 Clicks" Daily Activity     Outcome Measure   Help from another person eating meals?: A Little Help from another person taking care of personal grooming?: A Little Help from another person toileting, which includes using toliet, bedpan, or urinal?: A Little Help from another person bathing (including washing, rinsing, drying)?: A Lot Help from another person to put on and taking off regular upper body clothing?: A Little Help from another person to put on and taking off regular lower body clothing?: A Lot 6 Click Score: 16    End of Session Equipment Utilized During Treatment: Gait belt;Rollator (4 wheels);Oxygen  OT Visit Diagnosis: Unsteadiness on feet (  R26.81);Other abnormalities of gait and mobility (R26.89);Muscle weakness (generalized) (M62.81)   Activity Tolerance Patient tolerated treatment well   Patient Left in bed;with call bell/phone within reach (sitting EOB with lunch)   Nurse Communication Mobility status;Other (comment) (R hand pain)        Time: 1125-1209 OT Time Calculation (min): 44 min  Charges: OT General Charges $OT Visit: 1 Visit OT Treatments $Self Care/Home Management : 23-37 mins $Therapeutic Activity: 8-22 mins  Malachy Chamber, OTR/L Acute Rehab Services Office: [phone]   Layla Maw 12/08/2020, 12:50 PM

## 2020-12-08 NOTE — Care Management Important Message (Signed)
Important Message  Patient Details  Name: William Flores MRN: [id number] Date of Birth: [birth year]   Medicare Important Message Given:  Yes     [name] 12/08/2020, 8:38 AM

## 2020-12-08 NOTE — Progress Notes (Signed)
[facility] for warfarin > heparin while on CRRT Indication: atrial fibrillation  Allergies  Allergen Reactions   Codeine     Headache     Patient Measurements: Height: 5' 7"  (170.2 cm) Weight: 106.2 kg (234 lb 2.1 oz) IBW/kg (Calculated) : 66.1  Vital Signs: Temp: 97.9 F (36.6 C) (12/06 0717) Temp Source: Oral (12/06 0717) BP: 114/71 (12/06 0717) Pulse Rate: 62 (12/06 0717)  Labs: Recent Labs    12/06/20 0411 12/07/20 0430 12/07/20 0555 12/07/20 0649 12/08/20 1055  HGB 10.8* 10.7*  --   --  11.0*  HCT 38.4* 36.8*  --   --  38.0*  PLT 171 169  --   --  129*  LABPROT 16.7* 21.4*  --   --   --   INR 1.4* 1.9*  --   --   --   HEPARINUNFRC 0.40 >1.10* 0.42  --  0.46  CREATININE 2.44*  --   --  3.80* 3.87*     Estimated Creatinine Clearance: 21.2 mL/min (A) (by C-G formula based on SCr of 3.87 mg/dL (H)).   Medical History: Past Medical History:  Diagnosis Date   CAD (coronary artery disease)    LHC 2/08:  mRCA 50%, o/w no sig CAD, EF 25%   Cardiomyopathy ([place])    EF 35-40% in 2/16 in setting of AF with RVR >> echo 5/16 with improved LVF with EF 65-70%   Chronic atrial fibrillation ([facility])    Chronic diastolic heart failure ([facility])    HFPF - EF ~65-70% (OFTEN EXACERBATED BY AFIB)   CKD (chronic kidney disease)    hx of worsening renal failure and hyperK+ in setting of acute diast CHF >> required CVVHD in 4/16 and dialysis x 1 in 5/16   CKD stage 3 due to type 2 diabetes mellitus ([place]) 09/17/2015   Colon cancer ([place])    Colon polyps 09/21/2012   Tubular adenoma   Diabetes ([place])    Hyperlipidemia    Hypertension    Obesity hypoventilation syndrome ([place])    Renal insufficiency    Sleep apnea     Medications:   sodium chloride Stopped (12/05/20 1754)   heparin 1,950 Units/hr (12/08/20 1312)     Assessment: 45 yoM on warfarin PTA for hx AFib admitted for RV failure and AKI ultimately started on CRRT and IV heparin for  anticoagulation. Patient recently admitted here in October for GI bleed and per GI recommended INR 2-2.5.  Heparin therapeutic on 1950 units/hr, but trending up towards upper end of goal. CBC remains stable. Planning apixaban at discharge.   Goal of Therapy:  Heparin Level goal 0.3-0.5 Monitor platelets by anticoagulation protocol: Yes   Plan:  Reduce heparin to 1850 units/hr to keep within goal Daily heparin level and CBC F/u plans to transition to Eliquis once HD access, etc are completed.  Copay is now affordable at $9.85.  Erin Hearing PharmD., BCPS Clinical Pharmacist 12/08/2020 1:40 PM

## 2020-12-08 NOTE — Progress Notes (Signed)
[facility] NEPHROLOGY PROGRESS NOTE  Assessment/ Plan:  # Acute kidney injury on chronic kidney disease stage IIIb: Likely hemodynamically mediated in the setting of acute exacerbation of congestive heart failure.  Started CRRT for fluid overload refractory to diuretics which was a stopped on 12/2 after optimization of volume.  First intermittent HD 12/3.  Remains anuric without sign of renal recovery. Will plan to convert temporary HD catheter to tunneled HD catheter w/ IR, consult placed, leukocytosis seems to be a limiting factor however may be driven by steroids, likely to be done tomorrow. Next HD 12/7. Renal navigator is already aware of possible need of outpatient HD for AKI.  #  Acute exacerbation of diastolic heart failure: With severe pulmonary hypertension and significant third spacing/volume overload.  Volume status improved with CRRT, now on IHD.  Cardiology is following.  Continue fluid restriction.  # Encephalopathy: Likely multifactorial with hypercapnic respiratory failure/CO2 narcosis and azotemia contributing to possible hepatic encephalopathy with slightly elevated ammonia levels.  Mental status improved..  #  Anemia of chronic illness: He has severe iron deficiency with an iron saturation of 4% and ferritin of 31.  Treated with IV iron and now on ESA.  Monitor hemoglobin and transfuse as needed.  # Chronic atrial fibrillation: Developed A. fib with RVR managing with amiodarone.    #  Thrombocytopenia: Likely from splenic sequestration in the setting of cirrhosis, no evidence of bleeding diathesis.  Platelet count improved.  #CKD MBD:  Had hypophos due to CRRT.  Phosphorus level acceptable today. Trending up now, watch, low phos diet. May need to be started on binders soon  #Hypotension: midodrine to 15 mg 3 times daily.  Blood pressure acceptable range today.  #Hyponatremia -managing with HD, 137Na bath  Subjective: Seen and examined this am. No acute  events. Tolerated HD yesterday, net uf 3L. Objective Vital signs in last 24 hours: Vitals:   12/07/20 2016 12/08/20 0000 12/08/20 0403 12/08/20 0717  BP: 125/73 102/60 104/66 114/71  Pulse: 63 (!) 58 63 62  Resp: 20 19 16 18   Temp: 98.8 F (37.1 C) 98 F (36.7 C) 97.7 F (36.5 C) 97.9 F (36.6 C)  TempSrc: Oral Oral Oral Oral  SpO2: 96% 100% 98% 98%  Weight:   106.2 kg   Height:       Weight change: -0.9 kg  Intake/Output Summary (Last 24 hours) at 12/08/2020 0749 Last data filed at 12/08/2020 0404 Gross per 24 hour  Intake 440 ml  Output 3000 ml  Net -2560 ml       Labs: Basic Metabolic Panel: Recent Labs  Lab 12/05/20 0410 12/06/20 0411 12/07/20 0649  NA 131* 132* 128*  K 4.5 4.3 4.6  CL 100 97* 95*  CO2 25 27 24   GLUCOSE 157* 141* 160*  BUN 49* 38* 74*  CREATININE 2.86* 2.44* 3.80*  CALCIUM 8.6* 8.6* 8.5*  PHOS 4.7* 4.5 5.3*   Liver Function Tests: Recent Labs  Lab 12/05/20 0410 12/06/20 0411 12/07/20 0649  ALBUMIN 3.2* 3.3* 3.3*   No results for input(s): LIPASE, AMYLASE in the last 168 hours. No results for input(s): AMMONIA in the last 168 hours.  CBC: Recent Labs  Lab 12/03/20 0504 12/04/20 0418 12/05/20 0410 12/06/20 0411 12/07/20 0430  WBC 6.7 8.4 10.7* 11.2* 12.0*  HGB 9.4* 10.1* 10.4* 10.8* 10.7*  HCT 35.1* 36.0* 37.0* 38.4* 36.8*  MCV 96.2 94.2 93.9 93.0 91.8  PLT 136* 155 160 171 169   Cardiac Enzymes: No results  for input(s): CKTOTAL, CKMB, CKMBINDEX, TROPONINI in the last 168 hours. CBG: Recent Labs  Lab 12/06/20 2119 12/07/20 0554 12/07/20 1627 12/07/20 2144 12/08/20 0616  GLUCAP 231* 164* 117* 141* 90    Iron Studies: No results for input(s): IRON, TIBC, TRANSFERRIN, FERRITIN in the last 72 hours. Studies/Results: No results found.  Medications: Infusions:  sodium chloride Stopped (12/05/20 1754)   heparin 1,950 Units/hr (12/08/20 0006)    Scheduled Medications:  (feeding supplement) PROSource Plus  30 mL  Oral TID BM   allopurinol  300 mg Oral Daily   atorvastatin  20 mg Oral Daily   B-complex with vitamin C  1 tablet Oral Daily   Chlorhexidine Gluconate Cloth  6 each Topical Q0600   Chlorhexidine Gluconate Cloth  6 each Topical Q0600   darbepoetin (ARANESP) injection - NON-DIALYSIS  100 mcg Subcutaneous Q Sun-1800   insulin aspart  0-5 Units Subcutaneous QHS   insulin aspart  0-9 Units Subcutaneous TID WC   insulin glargine-yfgn  16 Units Subcutaneous QHS   lactulose  20 g Oral BID   mouth rinse  15 mL Mouth Rinse BID   metoprolol succinate  25 mg Oral Daily   midodrine  15 mg Oral TID WC   pantoprazole  40 mg Oral Daily   polyethylene glycol  17 g Oral Daily   sildenafil  20 mg Oral TID   sodium chloride flush  10-40 mL Intracatheter Q12H   Zinc Oxide   Topical BID    have reviewed scheduled and prn medications.  Physical Exam: General: NAD Heart:RRR, s1s2 nl Lungs: Decreased breath sound bilateral, no increased work of breathing Abdomen:soft, Non-tender, non-distended Extremities: Wrapped with bandages in both legs, edema is much better now. Dialysis Access: temp HD catheter   Sherrie Marsan 12/08/2020,7:49 AM  LOS: 16 days

## 2020-12-08 NOTE — Progress Notes (Signed)
Nutrition Follow-up  DOCUMENTATION CODES:   Obesity unspecified  INTERVENTION:  Continue 30 ml ProSource Plus TID, each supplement provides 100 kcals and 15 grams protein.    Continue B complex with C daily  NUTRITION DIAGNOSIS:   Increased nutrient needs related to acute illness as evidenced by estimated needs.  On-going  GOAL:   Patient will meet greater than or equal to 90% of their needs  Meeting needs through PO intake  MONITOR:   PO intake, Supplement acceptance, Labs, Weight trends  REASON FOR ASSESSMENT:   LOS, Rounds    ASSESSMENT:   70 yo male admitted with AKI on CKD 3 requiring CRRT, acute CHF. PMH includes CAD, CHF, CKD 3, colon cancer, DM, HLD, HTN, obesity hypovenitlation syndrome, cirrhosis.  11/20 Admitted 11/22 Paracentesis with 2.4 L removed 11/25 CRRT initiated 12/2 CRRT stopped 12/3 transitioned to [facility]  12/5 net UF 3L-pt tolerated HD, noted plans to convert from temporary HD catheter to tunneled HD catheter   Pt remains anuric. Pt with RV failure.   Pt reports continued good appetite and eating 100% of all meals and continues taking ProSource supplements. States he had some nausea during dialysis yesterday but was provided a [place] sandwich and felt better.   Current wt 106 kg; admit weight 130 kg. Net negative 26 L.     Labs: sodium 130, BUN 59, Cr 3.87, uric acid <1.5 Meds: aranesp, ss novolog, semglee, lactulose, miralax  Diet Order:   Diet Order             Diet 2 gram sodium Room service appropriate? No; Fluid consistency: Thin  Diet effective now                   EDUCATION NEEDS:   Not appropriate for education at this time  Skin:  Skin Assessment: Skin Integrity Issues: Skin Integrity Issues:: Stage I Stage I: coccyx  Last BM:  12/07/20  Height:   Ht Readings from Last 1 Encounters:  11/25/20 5' 7"  (1.702 m)    Weight:   Wt Readings from Last 1 Encounters:  12/08/20 106.2 kg    Ideal Body Weight:  67.2  kg  BMI:  Body mass index is 36.67 kg/m.  Estimated Nutritional Needs:   Kcal:  2100-2300 kcals  Protein:  105-115g  Fluid:  1L + UOP  Clayborne Dana, RDN, LDN Clinical Nutrition

## 2020-12-08 NOTE — Progress Notes (Addendum)
Spoke to [employer] with [facility] admissions. Pt's referral was received. Will fax Hep B total core antibody lab once it results. Pt's case is being reviewed for [facility] clinic. Will assist.  Melven Sartorius Renal Navigator [phone]  Addendum at 4:15 pm: Hep B total care antibody faxed to [facility] admissions this afternoon.

## 2020-12-08 NOTE — Progress Notes (Signed)
Patient refused BIPAP for HS use.

## 2020-12-08 NOTE — Progress Notes (Signed)
Patient ID: William Flores, male   DOB: May 13, 1950, 70 y.o.   MRN: [id number]    Advanced Heart Failure Rounding Note   Subjective:    Remains anuric. Transitioned to [facility] on 12/3 and tolerated well.   Tolerated iHD yesterday without issues.   No complaints today.    Objective:     Vital Signs:   Temp:  [97.7 F (36.5 C)-98.8 F (37.1 C)] 97.9 F (36.6 C) (12/06 0717) Pulse Rate:  [58-73] 62 (12/06 0717) Resp:  [16-20] 18 (12/06 0717) BP: (102-134)/(60-74) 114/71 (12/06 0717) SpO2:  [95 %-100 %] 98 % (12/06 0717) Weight:  [104.1 kg-106.2 kg] 106.2 kg (12/06 0403) Last BM Date: 12/07/20  Weight change: Filed Weights   12/07/20 0712 12/07/20 1109 12/08/20 0403  Weight: 107.2 kg 104.1 kg 106.2 kg    Intake/Output:   Intake/Output Summary (Last 24 hours) at 12/08/2020 0936 Last data filed at 12/08/2020 0404 Gross per 24 hour  Intake 440 ml  Output 3000 ml  Net -2560 ml   PHYSICAL EXAM: General: NAD Neck: No JVD, no thyromegaly or thyroid nodule.  Lungs: Clear to auscultation bilaterally with normal respiratory effort. CV: Nondisplaced PMI.  Heart irregular S1/S2, no S3/S4, no murmur.  No peripheral edema.   Abdomen: Soft, nontender, no hepatosplenomegaly, no distention.  Skin: Intact without lesions or rashes.  Neurologic: Alert and oriented x 3.  Psych: Normal affect. Extremities: No clubbing or cyanosis.  HEENT: Normal.   Telemetry: Afib 60s-70s (personally reviewed)  Labs: Basic Metabolic Panel: Recent Labs  Lab 12/03/20 0504 12/03/20 1000 12/03/20 1712 12/04/20 0418 12/04/20 1745 12/05/20 0410 12/06/20 0411 12/07/20 0649  NA 132* 132*   < > 134* 132* 131* 132* 128*  K 4.4 4.4   < > 4.2 4.6 4.5 4.3 4.6  CL 100 102   < > 102 99 100 97* 95*  CO2 27 24   < > 27 24 25 27 24   GLUCOSE 144* 180*   < > 115* 279* 157* 141* 160*  BUN 22 22   < > 26* 38* 49* 38* 74*  CREATININE 1.95* 1.82*   < > 1.81* 2.63* 2.86* 2.44* 3.80*  CALCIUM 8.6* 8.2*   < > 8.7*  8.5* 8.6* 8.6* 8.5*  MG 2.4 2.4  --  2.6*  --  2.6* 2.2  --   PHOS 2.1*  --    < > 1.9* 5.2* 4.7* 4.5 5.3*   < > = values in this interval not displayed.    Liver Function Tests: Recent Labs  Lab 12/04/20 0418 12/04/20 1745 12/05/20 0410 12/06/20 0411 12/07/20 0649  ALBUMIN 3.3* 3.3* 3.2* 3.3* 3.3*   No results for input(s): LIPASE, AMYLASE in the last 168 hours. No results for input(s): AMMONIA in the last 168 hours.   CBC: Recent Labs  Lab 12/03/20 0504 12/04/20 0418 12/05/20 0410 12/06/20 0411 12/07/20 0430  WBC 6.7 8.4 10.7* 11.2* 12.0*  HGB 9.4* 10.1* 10.4* 10.8* 10.7*  HCT 35.1* 36.0* 37.0* 38.4* 36.8*  MCV 96.2 94.2 93.9 93.0 91.8  PLT 136* 155 160 171 169    Cardiac Enzymes: No results for input(s): CKTOTAL, CKMB, CKMBINDEX, TROPONINI in the last 168 hours.  BNP: BNP (last 3 results) Recent Labs    11/20/20 1128 11/22/20 0425 12/01/20 0330  BNP 306.1* 270.0* 207.3*    ProBNP (last 3 results) No results for input(s): PROBNP in the last 8760 hours.    Other results:  Imaging: No results found.  Medications:     Scheduled Medications:  (feeding supplement) PROSource Plus  30 mL Oral TID BM   allopurinol  300 mg Oral Daily   atorvastatin  20 mg Oral Daily   B-complex with vitamin C  1 tablet Oral Daily   Chlorhexidine Gluconate Cloth  6 each Topical Q0600   Chlorhexidine Gluconate Cloth  6 each Topical Q0600   darbepoetin (ARANESP) injection - NON-DIALYSIS  100 mcg Subcutaneous Q Sun-1800   insulin aspart  0-5 Units Subcutaneous QHS   insulin aspart  0-9 Units Subcutaneous TID WC   insulin glargine-yfgn  16 Units Subcutaneous QHS   lactulose  20 g Oral BID   mouth rinse  15 mL Mouth Rinse BID   metoprolol succinate  25 mg Oral Daily   midodrine  15 mg Oral TID WC   pantoprazole  40 mg Oral Daily   polyethylene glycol  17 g Oral Daily   sildenafil  20 mg Oral TID   sodium chloride flush  10-40 mL Intracatheter Q12H   Zinc Oxide    Topical BID    Infusions:  sodium chloride Stopped (12/05/20 1754)   heparin 1,950 Units/hr (12/08/20 0006)    PRN Medications: sodium chloride, acetaminophen, alum & mag hydroxide-simeth, guaiFENesin-dextromethorphan, hydrocortisone cream, hydrOXYzine, lip balm, melatonin, Muscle Rub, oxyCODONE, sodium chloride flush   Assessment/Plan:    1. Cor pulmonale/RV failure: Suspect primarily due to OHS/OSA. Echo in 4/21 with EF 55-60%, RV severely dilated w/ severe RV dysfunction in the setting of severe pulmonary hypertension w/ PASP 76 mmHg. [place] 4/21 with elevated filling pressures, mixed pulmonary venous/pulmonary hypertension=> diuresed w/ IV Lasix in hospital and placed on po torsemide. Echo in 11/22 showed EF 55-60%, mod LVH, D-shaped septum, severe RV enlargement with severely decreased RV systolic function, IVC dilated. NYHA class IV. Now off milrinone and NE. Co-ox 63>>45%>>58%. Transitioned of CVVH to iHD on 12/3. Tolerating iHD well so far.  - Continue iHD per nephrology  - Continues midodrine 15 mg tid for BP support 2. Pulmonary HTN: PASP elevated at 76 mmHg by 4/21 echo estimate with RV systolic function severely reduced. Most likely poorly controlled OHS/OSA as cause (group 3 PH).  V/Q scan negative for chronic PE.  Mixed PVH/PAH on RHC.  Echo in 10/22 with severe RV failure and again this admission.   - Continue sildenafil 20 mg tid.  - Continue home oxygen and CPAP at night.    3. AKI on Stage IIIb CKD>>ESRD: Anuric and now HD dependent.  - iHD per nephrology - planning to convert from temp HD cath to tunneled HD cath w/ IR  - Renal navigator is already aware of need of outpatient HD  4. Atrial fibrillation: Chronic, unlikely to be able to successfully cardiovert given long-standing atrial fibrillation.   - Now off amio - continue Toprol XL 25 mg daily  - Continue heparin gtt until perm HD cath placed then transition to Eliquis 5 bid  5. PVCs: Now minimal.  - continue Toprol  XL  - Keep K > 4 and Mag > 2 6. OSA/OHS: Currently on [place].  At home, CPAP + oxygen.   7. Diabetes: SSI 8. MGUS: Previously followed by hem/onc but has not seen in several years.  Multiple myeloma panel 4/21 with presence of M-spike. 9. Cirrhosis: ?Due to NAFLD.  11/23 Ammonia 48. Paracentesis 2.4 L this admission. Mental status improved.  - Continue lactulose.  10. Anemia: Hgb 9.4>10.1>10.7. No obvious source of bleeding.  Has had IV  Fe.   11. Encephalopathy: Suspect CO2 narcosis + possible component hepatic encephalopathy with elevated NH3.  Alert/oriented this morning, overall improved.    - He remains on lactulose.  12. Thrombocytopenia: In setting of acute inflammatory illness and possibly splenic sequestration with cirrhosis. Plts were low before heparin gtt started, now normal range.  13. Rt Hand pain: Middle digit MCP joint. H/o gout. Pain feels similar.  Improved w/ prednisone (now off).  14. GOC: Lives in SNF.  Suspect end stage RV failure. Palliative care saw 11/28. For now has elected to remain full code. I was concerned that he would tolerate HD poorly but seems to be doing ok. Will need to return to SNF and set up HD in [facility].    Cardiology to follow at a distance.  Will make followup.  Call with questions.   Loralie Champagne 12/08/2020 9:40 AM

## 2020-12-08 NOTE — Progress Notes (Signed)
Progress Note    William Flores  VHQ:[id number] DOB: April 08, 1950  DOA: 11/22/2020 PCP: [name], [name], MD    Brief Narrative:     Medical records reviewed and are as summarized below:  William Flores is an 70 y.o. male nursing home resident with acute on chronic right heart failure cor pulmonale severe pulmonary hypertension, metabolic encephalopathy, acute on chronic anemia chronic with acute respiratory failure, cirrhosis secondary to [facility], chronic atrial fibrillation acute kidney injury, thrombocytopenia diabetes mellitus obstructive sleep apnea who presents with worsening volume overload and pulmonary critical care is asked to transfer to intensive care place a hemodialysis catheter so that he can undergo CRRT. Transferred to [facility] on 12/5.  Plan is for tunnel HD cath on Wednesday if WBC count stable.  11/27/2020 transfer to intensive care unit 11/30 off milrinone, and NE 12/2 transitioning to HD 12/5 transfer to [facility] 12/6 plan for temp HD catheter  Assessment/Plan:   Principal Problem:   Acute exacerbation of CHF (congestive heart failure) (HCC) Active Problems:   Right heart failure (HCC)   OSA (obstructive sleep apnea)   Anticoagulated on Coumadin   Atrial fibrillation with RVR ([place])   DM (diabetes mellitus), type 2 with renal complications (HCC)   Chronic respiratory failure with hypoxia (HCC)   Essential hypertension   CKD (chronic kidney disease), stage III ([place])   Mixed hyperlipidemia   Morbid obesity with BMI of 45.0-49.9, adult ([place])   Pulmonary hypertension (HCC)   Liver cirrhosis secondary to NASH (HCC)   Elevated brain natriuretic peptide (BNP) level   Thrombocytopenia (HCC)   GERD (gastroesophageal reflux disease)   Insomnia   Pressure injury of skin   Cor pulmonale/RV failure:  -cards: Suspects primarily due to OHS/OSA.  -Echo in 4/21 with EF 55-60%, RV severely dilated w/ severe RV dysfunction in the setting of severe pulmonary hypertension -  Continue HD per nephrology  - Continues midodrine 15 mg tid for BP support   Pulmonary HTN:  -per cHF team - Continue sildenafil 20 mg tid.  - Continue home oxygen and CPAP at night.     AKI on Stage IIIb CKD now ESRD:  -Anuric and now HD dependent.  - HD per nephrology - planning to convert from temp HD cath to tunneled HD cath Wednesday  Atrial fibrillation: Chronic, unlikely to be able to successfully cardiovert given long-standing atrial fibrillation.   - Continue po amiodarone for rate control for now with soft BP, eventually back to Toprol XL.  - Continue heparin gtt, until tunneled cath placed (plan for Wednesday)  Diabetes: type 2 -SSI  MGUS: Previously followed by hem/onc but has not seen in several years.  Multiple myeloma panel 4/21 with presence of M-spike.  Cirrhosis: ?Due to NAFLD.  11/23 Ammonia 48. Paracentesis 2.4 L this admission. Mental status improved.  - Continue lactulose.    Anemia: Hgb stable. No obvious source of bleeding.  -s/p IV Fe.    Hand pain-possible pseudogout gout? -? Source of increasing WBC count as this was treated with prednisone  obesity Body mass index is 36.67 kg/m.   Family Communication/Anticipated D/C date and plan/Code Status   DVT prophylaxis: heparin Code Status: Full Code.  Disposition Plan: Status is: Inpatient  Remains inpatient appropriate because: needs SNF         Medical Consultants:   Renal CHF team PCCM Palliative care IR  Subjective:   Back from HD, hungry  Objective:    Vitals:   12/07/20 2016  12/08/20 0000 12/08/20 0403 12/08/20 0717  BP: 125/73 102/60 104/66 114/71  Pulse: 63 (!) 58 63 62  Resp: _0 Temp: 98.8 F (37.1 C) 98 F (36.7 C) 97.7 F (36.5 C) 97.9 F (36.6 C)  TempSrc: Oral Oral Oral Oral  SpO2: 96% 100% 98% 98%  Weight:   106.2 kg   Height:        Intake/Output Summary (Last 24 hours) at 12/08/2020 1311 Last data filed at 12/08/2020 1258 Gross per 24 hour   Intake 920 ml  Output 0 ml  Net 920 ml   Filed Weights   12/07/20 0712 12/07/20 1109 12/08/20 0403  Weight: 107.2 kg 104.1 kg 106.2 kg    Exam:  General: Appearance:    Obese male in no acute distress- temp catheter in neck     Lungs:     respirations unlabored  Heart:    Normal heart rate.  MS:   All extremities are intact.    Neurologic:   Awake, alert     Data Reviewed:   I have personally reviewed following labs and imaging studies:  Labs: Labs show the following:   Basic Metabolic Panel: Recent Labs  Lab 12/03/20 0504 12/03/20 1000 12/03/20 1712 12/04/20 0418 12/04/20 1745 12/05/20 0410 12/06/20 0411 12/07/20 0649 12/08/20 1055  NA 132* 132*   < > 134* 132* 131* 132* 128* 130*  K 4.4 4.4   < > 4.2 4.6 4.5 4.3 4.6 4.4  CL 100 102   < > 102 99 100 97* 95* 96*  CO2 27 24   < > _1 GLUCOSE 144* 180*   < > 115* 279* 157* 141* 160* 100*  BUN 22 22   < > 26* 38* 49* 38* 74* 59*  CREATININE 1.95* 1.82*   < > 1.81* 2.63* 2.86* 2.44* 3.80* 3.87*  CALCIUM 8.6* 8.2*   < > 8.7* 8.5* 8.6* 8.6* 8.5* 8.6*  MG 2.4 2.4  --  2.6*  --  2.6* 2.2  --   --   PHOS 2.1*  --    < > 1.9* 5.2* 4.7* 4.5 5.3* 4.3   < > = values in this interval not displayed.   GFR Estimated Creatinine Clearance: 21.2 mL/min (A) (by C-G formula based on SCr of 3.87 mg/dL (H)). Liver Function Tests: Recent Labs  Lab 12/04/20 1745 12/05/20 0410 12/06/20 0411 12/07/20 0649 12/08/20 1055  ALBUMIN 3.3* 3.2* 3.3* 3.3* 3.2*   No results for input(s): LIPASE, AMYLASE in the last 168 hours. No results for input(s): AMMONIA in the last 168 hours. Coagulation profile Recent Labs  Lab 12/03/20 0504 12/04/20 0418 12/05/20 0410 12/06/20 0411 12/07/20 0430  INR 1.4* 1.3* 1.4* 1.4* 1.9*    CBC: Recent Labs  Lab 12/04/20 0418 12/05/20 0410 12/06/20 0411 12/07/20 0430 12/08/20 1055  WBC 8.4 10.7* 11.2* 12.0* 12.1*  HGB 10.1* 10.4* 10.8* 10.7* 11.0*  HCT 36.0* 37.0* 38.4*  36.8* 38.0*  MCV 94.2 93.9 93.0 91.8 92.7  PLT 155 160 171 169 129*   Cardiac Enzymes: No results for input(s): CKTOTAL, CKMB, CKMBINDEX, TROPONINI in the last 168 hours. BNP (last 3 results) No results for input(s): PROBNP in the last 8760 hours. CBG: Recent Labs  Lab 12/07/20 0554 12/07/20 1627 12/07/20 2144 12/08/20 0616 12/08/20 1134  GLUCAP 164* 117* 141* 90 87   D-Dimer: No results for input(s): DDIMER in the last 72 hours. Hgb A1c: No results for input(s):  HGBA1C in the last 72 hours. Lipid Profile: No results for input(s): CHOL, HDL, LDLCALC, TRIG, CHOLHDL, LDLDIRECT in the last 72 hours. Thyroid function studies: No results for input(s): TSH, T4TOTAL, T3FREE, THYROIDAB in the last 72 hours.  Invalid input(s): FREET3 Anemia work up: No results for input(s): VITAMINB12, FOLATE, FERRITIN, TIBC, IRON, RETICCTPCT in the last 72 hours. Sepsis Labs: Recent Labs  Lab 12/05/20 0410 12/06/20 0411 12/07/20 0430 12/08/20 1055  WBC 10.7* 11.2* 12.0* 12.1*    Microbiology No results found for this or any previous visit (from the past 240 hour(s)).  Procedures and diagnostic studies:  No results found.  Medications:    (feeding supplement) PROSource Plus  30 mL Oral TID BM   allopurinol  300 mg Oral Daily   atorvastatin  20 mg Oral Daily   B-complex with vitamin C  1 tablet Oral Daily   Chlorhexidine Gluconate Cloth  6 each Topical Q0600   Chlorhexidine Gluconate Cloth  6 each Topical Q0600   darbepoetin (ARANESP) injection - NON-DIALYSIS  100 mcg Subcutaneous Q Sun-1800   insulin aspart  0-5 Units Subcutaneous QHS   insulin aspart  0-9 Units Subcutaneous TID WC   insulin glargine-yfgn  16 Units Subcutaneous QHS   lactulose  20 g Oral BID   mouth rinse  15 mL Mouth Rinse BID   metoprolol succinate  25 mg Oral Daily   midodrine  15 mg Oral TID WC   pantoprazole  40 mg Oral Daily   polyethylene glycol  17 g Oral Daily   sildenafil  20 mg Oral TID   sodium  chloride flush  10-40 mL Intracatheter Q12H   Zinc Oxide   Topical BID   Continuous Infusions:  sodium chloride Stopped (12/05/20 1754)   heparin 1,950 Units/hr (12/08/20 0006)     LOS: 16 days   Geradine Girt  [facility]   How to contact the [facility] Attending or Consulting provider [place] or covering provider during after hours [place], for this patient?  Check the care team in [name] and look for a) attending/consulting TRH provider listed and b) the [facility] team listed Log into [email or web address] and use [place]'s universal password to access. If you do not have the password, please contact the hospital operator. Locate the [facility] provider you are looking for under [facility] and page to a number that you can be directly reached. If you still have difficulty reaching the provider, please page the [facility] (Director on Call) for the Hospitalists listed on amion for assistance.  12/08/2020, 1:11 PM
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
## 2020-12-09 DIAGNOSIS — I50813 Acute on chronic right heart failure: Secondary | ICD-10-CM | POA: Diagnosis not present

## 2020-12-09 DIAGNOSIS — I4891 Unspecified atrial fibrillation: Secondary | ICD-10-CM | POA: Diagnosis not present

## 2020-12-09 DIAGNOSIS — N186 End stage renal disease: Secondary | ICD-10-CM | POA: Diagnosis not present

## 2020-12-09 LAB — CBC
HCT: 38.1 % — ABNORMAL LOW (ref 39.0–52.0)
Hemoglobin: 10.9 g/dL — ABNORMAL LOW (ref 13.0–17.0)
MCH: 26.3 pg (ref 26.0–34.0)
MCHC: 28.6 g/dL — ABNORMAL LOW (ref 30.0–36.0)
MCV: 92 fL (ref 80.0–100.0)
Platelets: 125 10*3/uL — ABNORMAL LOW (ref 150–400)
RBC: 4.14 MIL/uL — ABNORMAL LOW (ref 4.22–5.81)
RDW: 24.5 % — ABNORMAL HIGH (ref 11.5–15.5)
WBC: 11 10*3/uL — ABNORMAL HIGH (ref 4.0–10.5)
nRBC: 0.2 % (ref 0.0–0.2)

## 2020-12-09 LAB — RENAL FUNCTION PANEL
Albumin: 3.3 g/dL — ABNORMAL LOW (ref 3.5–5.0)
Anion gap: 10 (ref 5–15)
BUN: 77 mg/dL — ABNORMAL HIGH (ref 8–23)
CO2: 24 mmol/L (ref 22–32)
Calcium: 8.7 mg/dL — ABNORMAL LOW (ref 8.9–10.3)
Chloride: 98 mmol/L (ref 98–111)
Creatinine, Ser: 5.01 mg/dL — ABNORMAL HIGH (ref 0.61–1.24)
GFR, Estimated: 12 mL/min — ABNORMAL LOW (ref >60)
Glucose, Bld: 120 mg/dL — ABNORMAL HIGH (ref 70–99)
Phosphorus: 5.9 mg/dL — ABNORMAL HIGH (ref 2.5–4.6)
Potassium: 5 mmol/L (ref 3.5–5.1)
Sodium: 132 mmol/L — ABNORMAL LOW (ref 135–145)

## 2020-12-09 LAB — GLUCOSE, CAPILLARY
Glucose-Capillary: 119 mg/dL — ABNORMAL HIGH (ref 70–99)
Glucose-Capillary: 117 mg/dL — ABNORMAL HIGH (ref 70–99)
Glucose-Capillary: 132 mg/dL — ABNORMAL HIGH (ref 70–99)

## 2020-12-09 LAB — HEPARIN LEVEL (UNFRACTIONATED)
Heparin Unfractionated: 0.23 IU/mL — ABNORMAL LOW (ref 0.30–0.70)
Heparin Unfractionated: 0.31 IU/mL (ref 0.30–0.70)

## 2020-12-09 LAB — HEPATITIS B SURFACE ANTIBODY, QUANTITATIVE: Hep B S AB Quant (Post): 7.1 m[IU]/mL — ABNORMAL LOW (ref >9.9)

## 2020-12-09 MED ORDER — HEPARIN SODIUM (PORCINE) 1000 UNIT/ML IJ SOLN
INTRAMUSCULAR | Status: AC
  Filled 2020-12-09: qty 3

## 2020-12-09 NOTE — Progress Notes (Addendum)
Spoke to [place] with [facility] Admissions this am. [facility] received required lab work and clinic is currently reviewing pt's case. Will follow and assist.   Melven Sartorius Renal Navigator [phone]  Addendum at 12:39 pm: Pt has been accepted at [facility] on a TTS schedule. Pt will need to arrive at 6:30 for 6:45 chair time. Pt can start at clinic tomorrow but if pt does not start tomorrow, then pt cannot start until Tuesday. Clinic will not start pt on Saturday. Pt will also need a covid test in order to begin at clinic. Update provided to attending, nephrologist, and CSW. Will provide details to pt once he completes HD today.    Addendum at 3:55 pm: Spoke to [place] at [facility] to inform clinic that pt will not be starting tomorrow. Clinic is agreeable starting pt on Saturday if needed. Update provided to care team via secure chat.

## 2020-12-09 NOTE — Progress Notes (Signed)
PROGRESS NOTE    William Flores  LSL:[id number] DOB: 1950/01/20 DOA: 11/22/2020 PCP: [name], [name], MD    Chief Complaint  Patient presents with   Groin Swelling    Brief Narrative:  William Flores is an 70 y.o. male nursing home resident with acute on chronic right heart failure cor pulmonale severe pulmonary hypertension, metabolic encephalopathy, acute on chronic anemia chronic with acute respiratory failure, cirrhosis secondary to [facility], chronic atrial fibrillation acute kidney injury, thrombocytopenia diabetes mellitus obstructive sleep apnea who presents with worsening volume overload and pulmonary critical care is asked to transfer to intensive care place a hemodialysis catheter so that he can undergo CRRT. Transferred to [facility] on 12/5.  Plan is for tunnel HD cath on Wednesday if WBC count stable.  11/27/2020 transfer to intensive care unit 11/30 off milrinone, and NE 12/2 transitioning to HD 12/5 transfer to [place] for tunneled HD catheter on 12/8  Subjective:  He is seen after return from dialysis, report being tired and weak, denies pain  Assessment & Plan:   Principal Problem:   Acute exacerbation of CHF (congestive heart failure) ([place]) Active Problems:   Right heart failure (HCC)   OSA (obstructive sleep apnea)   Anticoagulated on Coumadin   Atrial fibrillation with RVR ([place])   DM (diabetes mellitus), type 2 with renal complications (HCC)   Chronic respiratory failure with hypoxia (HCC)   Essential hypertension   CKD (chronic kidney disease), stage III ([place])   Mixed hyperlipidemia   Morbid obesity with BMI of 45.0-49.9, adult ([place])   Pulmonary hypertension ([place])   Liver cirrhosis secondary to NASH (HCC)   Elevated brain natriuretic peptide (BNP) level   Thrombocytopenia (HCC)   GERD (gastroesophageal reflux disease)   Insomnia   Pressure injury of skin   Cor pulmonale/RV failure:  -cards: Suspects primarily due to OHS/OSA.  -Echo in 4/21 with EF  55-60%, RV severely dilated w/ severe RV dysfunction in the setting of severe pulmonary hypertension - Continue HD per nephrology  - Continues midodrine 15 mg tid for BP support    Pulmonary HTN:  -per cHF team - Continue sildenafil 20 mg tid.  - Continue home oxygen and CPAP at night.      AKI on Stage IIIb CKD now ESRD:  -Anuric and now HD dependent.  - HD per nephrology - planning to convert from temp HD cath to tunneled HD cath on 12/8   Atrial fibrillation: Chronic, unlikely to be able to successfully cardiovert given long-standing atrial fibrillation.   - Continue po amiodarone for rate control for now with soft BP, eventually back to Toprol XL.  - Continue heparin gtt, until tunneled cath placed    Diabetes: type 2 -SSI   MGUS: Previously followed by hem/onc but has not seen in several years.  Multiple myeloma panel 4/21 with presence of M-spike.   Cirrhosis: ?Due to NAFLD.  11/23 Ammonia 48. Paracentesis 2.4 L this admission. Mental status improved.  - Continue lactulose.     Anemia: Hgb stable. No obvious source of bleeding.  -s/p IV Fe.     Hand pain-possible pseudogout gout? -? Source of increasing WBC count as this was treated with prednisone   obesity Body mass index is 36.67 kg/m.   Nutritional Assessment:  The patient's BMI is: Body mass index is 35.84 kg/m.Marland Kitchen  Seen by dietician.  I agree with the assessment and plan as outlined below:  Nutrition Status: Nutrition Problem: Increased nutrient needs Etiology: acute illness  Signs/Symptoms: estimated needs Interventions: MVI, Prostat  .     Skin Assessment:  I have examined the patient's skin and I agree with the wound assessment as performed by the wound care RN as outlined below:  Pressure Ulcer 02/17/14 Stage II -  Partial thickness loss of dermis presenting as a shallow open ulcer with a red, pink wound bed without slough. (Active)  02/17/14 2030  Location: Sacrum  Location Orientation:  Bilateral  Staging: Stage II -  Partial thickness loss of dermis presenting as a shallow open ulcer with a red, pink wound bed without slough.  Wound Description (Comments):   Present on Admission: Yes (present on admission to 2central)     Pressure Injury 11/24/20 Coccyx Lower;Mid Stage 1 -  Intact skin with non-blanchable redness of a localized area usually over a bony prominence. SKIN INTACT BUT BOGGY (Active)  11/24/20 2202  Location: Coccyx  Location Orientation: Lower;Mid  Staging: Stage 1 -  Intact skin with non-blanchable redness of a localized area usually over a bony prominence.  Wound Description (Comments): SKIN INTACT BUT BOGGY  Present on Admission: Yes    Unresulted Labs (From admission, onward)     Start     Ordered   12/09/20 2000  Heparin level (unfractionated)  Once-Timed,   TIMED       Question:  Specimen collection method  Answer:  Unit=Unit collect   12/09/20 1148   12/02/20 0500  Heparin level (unfractionated)  Daily,   R     Question:  Specimen collection method  Answer:  Unit=Unit collect   11/30/20 1815   12/01/20 0500  CBC  Daily,   R     Question:  Specimen collection method  Answer:  Unit=Unit collect   11/30/20 0858   Signed and Held  Renal function panel  Once,   R       Question:  Specimen collection method  Answer:  Unit=Unit collect   Signed and Held   Signed and Held  CBC  Once,   R       Question:  Specimen collection method  Answer:  Unit=Unit collect   Signed and Held   Signed and Held  CBC  Once,   R       Question:  Specimen collection method  Answer:  Unit=Unit collect   Signed and Held              DVT prophylaxis: SCDs Start: 11/22/20 0738   Code Status:full Family Communication: Patient Disposition:   Status is: Inpatient  Dispo: The patient is from: Home              Anticipated d/c is to: SNF              Anticipated d/c date is: Likely Friday                 Consultants:  Critical  care Nephrology IR Cardiology/heart failure team Palliative care  Procedures:  Started dialysis   Antimicrobials:   Anti-infectives (From admission, onward)    None           Objective: Vitals:   12/09/20 1300 12/09/20 1330 12/09/20 1350 12/09/20 1400  BP: 124/60 (!) 125/50 127/61 130/69  Pulse:   68 60  Resp: 14 18 20 16   Temp:   (!) 96.7 F (35.9 C)   TempSrc:   Temporal   SpO2:   98% 99%  Weight:   103.8 kg   Height:  Intake/Output Summary (Last 24 hours) at 12/09/2020 1858 Last data filed at 12/09/2020 1350 Gross per 24 hour  Intake 600 ml  Output 2800 ml  Net -2200 ml   Filed Weights   12/09/20 0437 12/09/20 0944 12/09/20 1350  Weight: 107.8 kg 106.8 kg 103.8 kg    Examination:  General exam: Appear weak ,alert, awake, communicative, NAD Respiratory system: Clear to auscultation. Respiratory effort normal. Cardiovascular system:  RRR.  Gastrointestinal system: Abdomen is nondistended, soft and nontender.  Normal bowel sounds heard. Central nervous system: Alert and oriented. No focal neurological deficits. Extremities:  no edema Skin: No rashes, lesions or ulcers Psychiatry: Judgement and insight appear normal. Mood & affect appropriate.     Data Reviewed: I have personally reviewed following labs and imaging studies  CBC: Recent Labs  Lab 12/05/20 0410 12/06/20 0411 12/07/20 0430 12/08/20 1055 12/09/20 0506  WBC 10.7* 11.2* 12.0* 12.1* 11.0*  HGB 10.4* 10.8* 10.7* 11.0* 10.9*  HCT 37.0* 38.4* 36.8* 38.0* 38.1*  MCV 93.9 93.0 91.8 92.7 92.0  PLT 160 171 169 129* 125*    Basic Metabolic Panel: Recent Labs  Lab 12/03/20 0504 12/03/20 1000 12/03/20 1712 12/04/20 0418 12/04/20 1745 12/05/20 0410 12/06/20 0411 12/07/20 0649 12/08/20 1055 12/09/20 0953  NA 132* 132*   < > 134*   < > 131* 132* 128* 130* 132*  K 4.4 4.4   < > 4.2   < > 4.5 4.3 4.6 4.4 5.0  CL 100 102   < > 102   < > 100 97* 95* 96* 98  CO2 27 24   < > 27    < > 25 27 24 25 24   GLUCOSE 144* 180*   < > 115*   < > 157* 141* 160* 100* 120*  BUN 22 22   < > 26*   < > 49* 38* 74* 59* 77*  CREATININE 1.95* 1.82*   < > 1.81*   < > 2.86* 2.44* 3.80* 3.87* 5.01*  CALCIUM 8.6* 8.2*   < > 8.7*   < > 8.6* 8.6* 8.5* 8.6* 8.7*  MG 2.4 2.4  --  2.6*  --  2.6* 2.2  --   --   --   PHOS 2.1*  --    < > 1.9*   < > 4.7* 4.5 5.3* 4.3 5.9*   < > = values in this interval not displayed.    GFR: Estimated Creatinine Clearance: 16.2 mL/min (A) (by C-G formula based on SCr of 5.01 mg/dL (H)).  Liver Function Tests: Recent Labs  Lab 12/05/20 0410 12/06/20 0411 12/07/20 0649 12/08/20 1055 12/09/20 0953  ALBUMIN 3.2* 3.3* 3.3* 3.2* 3.3*    CBG: Recent Labs  Lab 12/08/20 1134 12/08/20 1600 12/08/20 2100 12/09/20 0550 12/09/20 1555  GLUCAP 87 138* 168* 119* 117*     No results found for this or any previous visit (from the past 240 hour(s)).       Radiology Studies: No results found.      Scheduled Meds:  (feeding supplement) PROSource Plus  30 mL Oral TID BM   allopurinol  300 mg Oral Daily   atorvastatin  20 mg Oral Daily   B-complex with vitamin C  1 tablet Oral Daily   Chlorhexidine Gluconate Cloth  6 each Topical Q0600   Chlorhexidine Gluconate Cloth  6 each Topical Q0600   darbepoetin (ARANESP) injection - NON-DIALYSIS  100 mcg Subcutaneous Q Sun-1800   insulin aspart  0-5 Units Subcutaneous QHS  insulin aspart  0-9 Units Subcutaneous TID WC   insulin glargine-yfgn  16 Units Subcutaneous QHS   lactulose  20 g Oral BID   mouth rinse  15 mL Mouth Rinse BID   metoprolol succinate  25 mg Oral Daily   midodrine  15 mg Oral TID WC   pantoprazole  40 mg Oral Daily   polyethylene glycol  17 g Oral Daily   sildenafil  20 mg Oral TID   sodium chloride flush  10-40 mL Intracatheter Q12H   Zinc Oxide   Topical BID   Continuous Infusions:  sodium chloride Stopped (12/05/20 1754)   heparin 1,950 Units/hr (12/09/20 1620)     LOS: 17  days   Time spent: 75mns Greater than 50% of this time was spent in counseling, explanation of diagnosis, planning of further management, and coordination of care.   Voice Recognition /Viviann Sparedictation system was used to create this note, attempts have been made to correct errors. Please contact the author with questions and/or clarifications.   FFlorencia Reasons MD PhD FACP [facility]  Available via Epic secure chat 7am-7pm for nonurgent issues Please page for urgent issues To page the attending provider between 7A-7P or the covering provider during after hours 7P-7A, please log into the web site [email or web address] and access using universal  password for that web site. If you do not have the password, please call the hospital operator.    12/09/2020, 6:58 PM

## 2020-12-09 NOTE — Progress Notes (Signed)
[place] for warfarin > heparin while on CRRT Indication: atrial fibrillation  Allergies  Allergen Reactions   Codeine     Headache     Patient Measurements: Height: 5' 7"  (170.2 cm) Weight: 106.8 kg (235 lb 7.2 oz) IBW/kg (Calculated) : 66.1  Vital Signs: Temp: 97.4 F (36.3 C) (12/07 0944) Temp Source: Tympanic (12/07 0944) BP: 114/54 (12/07 1130) Pulse Rate: 61 (12/07 1130)  Labs: Recent Labs    12/07/20 0430 12/07/20 0555 12/07/20 0649 12/08/20 1055 12/09/20 0506 12/09/20 0953  HGB 10.7*  --   --  11.0* 10.9*  --   HCT 36.8*  --   --  38.0* 38.1*  --   PLT 169  --   --  129* 125*  --   LABPROT 21.4*  --   --   --   --   --   INR 1.9*  --   --   --   --   --   HEPARINUNFRC >1.10* 0.42  --  0.46 0.23*  --   CREATININE  --   --  3.80* 3.87*  --  5.01*     Estimated Creatinine Clearance: 16.4 mL/min (A) (by C-G formula based on SCr of 5.01 mg/dL (H)).   Medical History: Past Medical History:  Diagnosis Date   CAD (coronary artery disease)    LHC 2/08:  mRCA 50%, o/w no sig CAD, EF 25%   Cardiomyopathy ([place])    EF 35-40% in 2/16 in setting of AF with RVR >> echo 5/16 with improved LVF with EF 65-70%   Chronic atrial fibrillation (HCC)    Chronic diastolic heart failure (HCC)    HFPF - EF ~65-70% (OFTEN EXACERBATED BY AFIB)   CKD (chronic kidney disease)    hx of worsening renal failure and hyperK+ in setting of acute diast CHF >> required CVVHD in 4/16 and dialysis x 1 in 5/16   CKD stage 3 due to type 2 diabetes mellitus ([place]) 09/17/2015   Colon cancer ([place])    Colon polyps 09/21/2012   Tubular adenoma   Diabetes ([place])    Hyperlipidemia    Hypertension    Obesity hypoventilation syndrome ([place])    Renal insufficiency    Sleep apnea     Medications:   sodium chloride Stopped (12/05/20 1754)   heparin 1,850 Units/hr (12/08/20 1343)     Assessment: 76 yoM on warfarin PTA for hx AFib admitted for RV failure  and AKI ultimately started on CRRT and IV heparin for anticoagulation. Patient recently admitted here in October for GI bleed and per GI recommended INR 2-2.5.  Heparin now subtherapeutic after rate adjusted to 1850 units/hr,  CBC remains stable. Planning apixaban at discharge.   Goal of Therapy:  Heparin Level goal 0.3-0.5 Monitor platelets by anticoagulation protocol: Yes   Plan:  Increase heparin back to 1950 units/hr  Recheck heparin level to confirm tonight Daily heparin level and CBC F/u plans to transition to Eliquis once HD access, etc are completed.  Copay is now affordable at $9.85.  Erin Hearing PharmD., BCPS Clinical Pharmacist 12/09/2020 11:48 AM

## 2020-12-09 NOTE — Progress Notes (Signed)
[facility] NEPHROLOGY PROGRESS NOTE  Assessment/ Plan:  # Acute kidney injury on chronic kidney disease stage IIIb: Likely hemodynamically mediated in the setting of acute exacerbation of congestive heart failure.  Started CRRT for fluid overload refractory to diuretics which was a stopped on 12/2 after optimization of volume.  First intermittent HD 12/3.  Remains anuric without sign of renal recovery. -HD later today. Reconsulted IR for [facility] placement (leukocytosis resolved). Next HD 12/9 - Renal navigator is already aware of possible need of outpatient HD for AKI.  #  Acute exacerbation of diastolic heart failure: With severe pulmonary hypertension and significant third spacing/volume overload.  Volume status improved with CRRT, now on IHD.  Cardiology is following.  Continue fluid restriction.  # Encephalopathy: Likely multifactorial with hypercapnic respiratory failure/CO2 narcosis and azotemia contributing to possible hepatic encephalopathy with slightly elevated ammonia levels.  Mental status improved..  #  Anemia of chronic illness: He has severe iron deficiency with an iron saturation of 4% and ferritin of 31.  Treated with IV iron and now on ESA.  Monitor hemoglobin and transfuse as needed.  # Chronic atrial fibrillation: Developed A. fib with RVR managing with amiodarone.    #  Thrombocytopenia: Likely from splenic sequestration in the setting of cirrhosis, no evidence of bleeding diathesis.  Platelet count improved.  #CKD MBD:  Had hypophos due to CRRT.  Phosphorus level acceptable today. Trending up now, watch, low phos diet. May need to be started on binders soon  #Hypotension: midodrine to 15 mg 3 times daily.  Blood pressure acceptable range today.  #Hyponatremia -managing with HD, 137Na bath  Subjective: Seen and examined this am. No acute events. Open for HD later today. Wbc down to 11. No new complaints Vital signs in last 24 hours: Vitals:   12/08/20  1601 12/08/20 1946 12/08/20 2355 12/09/20 0437  BP: 119/69 123/62 119/69 111/64  Pulse: 63 60 (!) 58 72  Resp: 19 18 20 18   Temp:  98.5 F (36.9 C) 98 F (36.7 C) 97.6 F (36.4 C)  TempSrc:  Oral Oral Oral  SpO2: 96% 98% 97% 97%  Weight:    107.8 kg  Height:       Weight change: 0.6 kg  Intake/Output Summary (Last 24 hours) at 12/09/2020 0728 Last data filed at 12/09/2020 0007 Gross per 24 hour  Intake 720 ml  Output 0 ml  Net 720 ml       Labs: Basic Metabolic Panel: Recent Labs  Lab 12/06/20 0411 12/07/20 0649 12/08/20 1055  NA 132* 128* 130*  K 4.3 4.6 4.4  CL 97* 95* 96*  CO2 27 24 25   GLUCOSE 141* 160* 100*  BUN 38* 74* 59*  CREATININE 2.44* 3.80* 3.87*  CALCIUM 8.6* 8.5* 8.6*  PHOS 4.5 5.3* 4.3   Liver Function Tests: Recent Labs  Lab 12/06/20 0411 12/07/20 0649 12/08/20 1055  ALBUMIN 3.3* 3.3* 3.2*   No results for input(s): LIPASE, AMYLASE in the last 168 hours. No results for input(s): AMMONIA in the last 168 hours.  CBC: Recent Labs  Lab 12/05/20 0410 12/06/20 0411 12/07/20 0430 12/08/20 1055 12/09/20 0506  WBC 10.7* 11.2* 12.0* 12.1* 11.0*  HGB 10.4* 10.8* 10.7* 11.0* 10.9*  HCT 37.0* 38.4* 36.8* 38.0* 38.1*  MCV 93.9 93.0 91.8 92.7 92.0  PLT 160 171 169 129* 125*   Cardiac Enzymes: No results for input(s): CKTOTAL, CKMB, CKMBINDEX, TROPONINI in the last 168 hours. CBG: Recent Labs  Lab 12/08/20 0616 12/08/20 1134  12/08/20 1600 12/08/20 2100 12/09/20 0550  GLUCAP 90 87 138* 168* 119*    Iron Studies: No results for input(s): IRON, TIBC, TRANSFERRIN, FERRITIN in the last 72 hours. Studies/Results: No results found.  Medications: Infusions:  sodium chloride Stopped (12/05/20 1754)   heparin 1,850 Units/hr (12/08/20 1343)    Scheduled Medications:  (feeding supplement) PROSource Plus  30 mL Oral TID BM   allopurinol  300 mg Oral Daily   atorvastatin  20 mg Oral Daily   B-complex with vitamin C  1 tablet Oral Daily    Chlorhexidine Gluconate Cloth  6 each Topical Q0600   Chlorhexidine Gluconate Cloth  6 each Topical Q0600   darbepoetin (ARANESP) injection - NON-DIALYSIS  100 mcg Subcutaneous Q Sun-1800   insulin aspart  0-5 Units Subcutaneous QHS   insulin aspart  0-9 Units Subcutaneous TID WC   insulin glargine-yfgn  16 Units Subcutaneous QHS   lactulose  20 g Oral BID   mouth rinse  15 mL Mouth Rinse BID   metoprolol succinate  25 mg Oral Daily   midodrine  15 mg Oral TID WC   pantoprazole  40 mg Oral Daily   polyethylene glycol  17 g Oral Daily   sildenafil  20 mg Oral TID   sodium chloride flush  10-40 mL Intracatheter Q12H   Zinc Oxide   Topical BID    have reviewed scheduled and prn medications.  Physical Exam: General: NAD Heart:RRR, s1s2 nl Lungs: normal wob Abdomen:soft, Non-tender, non-distended Extremities: Wrapped with bandages in both legs, edema is much better now. Dialysis Access: temp HD catheter   Armany Mano 12/09/2020,7:28 AM  LOS: 17 days

## 2020-12-09 NOTE — Consult Note (Signed)
Chief Complaint: Patient was seen in consultation today for tunneled dialysis catheter placement Chief Complaint  Patient presents with   Groin Swelling   at the request of Dr Loyal Gambler   Supervising Physician: Aletta Edouard  Patient Status: [facility] - In-pt  History of Present Illness: William Flores is a 70 y.o. male   Acute on chronic renal disease 3b Exacerbation CHF CRRT initially then stopped 12/2 HD started 12/3 Dr Candiss Norse note today:   Remains anuric without sign of renal recovery. -HD later today. Reconsulted IR for [facility] placement (leukocytosis resolved). Next HD 12/9  Will need OP ongoing dialysis Scheduled for tunneled dialysis catheter tomorrow   Past Medical History:  Diagnosis Date   CAD (coronary artery disease)    LHC 2/08:  mRCA 50%, o/w no sig CAD, EF 25%   Cardiomyopathy ([place])    EF 35-40% in 2/16 in setting of AF with RVR >> echo 5/16 with improved LVF with EF 65-70%   Chronic atrial fibrillation (HCC)    Chronic diastolic heart failure (HCC)    HFPF - EF ~65-70% (OFTEN EXACERBATED BY AFIB)   CKD (chronic kidney disease)    hx of worsening renal failure and hyperK+ in setting of acute diast CHF >> required CVVHD in 4/16 and dialysis x 1 in 5/16   CKD stage 3 due to type 2 diabetes mellitus ([place]) 09/17/2015   Colon cancer ([place])    Colon polyps 09/21/2012   Tubular adenoma   Diabetes ([place])    Hyperlipidemia    Hypertension    Obesity hypoventilation syndrome ([place])    Renal insufficiency    Sleep apnea     Past Surgical History:  Procedure Laterality Date   CIRCUMCISION     COLON RESECTION     COLONOSCOPY N/A 09/21/2012   Procedure: COLONOSCOPY;  Surgeon: Inda Castle, MD;  Location: [facility] ENDOSCOPY;  Service: Endoscopy;  Laterality: N/A;   COLONOSCOPY N/A 12/28/2018   Procedure: COLONOSCOPY;  Surgeon: Rogene Houston, MD;  Location: [facility] ENDO SUITE;  Service: Endoscopy;  Laterality: N/A;   COLONOSCOPY WITH PROPOFOL N/A 10/17/2020   Procedure:  COLONOSCOPY WITH PROPOFOL;  Surgeon: Harvel Quale, MD;  Location: [facility] ENDO SUITE;  Service: Gastroenterology;  Laterality: N/A;   ENTEROSCOPY N/A 06/24/2020   Procedure: ENTEROSCOPY;  Surgeon: Gatha Mayer, MD;  Location: [facility] ENDOSCOPY;  Service: Endoscopy;  Laterality: N/A;   ESOPHAGOGASTRODUODENOSCOPY N/A 12/28/2018   Procedure: ESOPHAGOGASTRODUODENOSCOPY (EGD);  Surgeon: Rogene Houston, MD;  Location: [facility];  Service: Endoscopy;  Laterality: N/A;   POLYPECTOMY  10/17/2020   Procedure: POLYPECTOMY;  Surgeon: Harvel Quale, MD;  Location: [facility];  Service: Gastroenterology;;  rectal, transverse, descending, ascending, sigmoid   RIGHT HEART CATH N/A 04/11/2019   Procedure: RIGHT HEART CATH;  Surgeon: Larey Dresser, MD;  Location: [facility];  Service: Cardiovascular;  Laterality: N/A;   US ECHOCARDIOGRAPHY  03/04/2010   abnormal study    Allergies: Codeine  Medications: Prior to Admission medications   Medication Sig Start Date End Date Taking? Authorizing Provider  allopurinol (ZYLOPRIM) 300 MG tablet Take 1 tablet (300 mg total) by mouth daily. 12/20/19  Yes [name], [name], MD  insulin degludec (TRESIBA) 100 UNIT/ML FlexTouch Pen Inject 24 Units into the skin at bedtime.   Yes [provider]  KLOR-CON M20 20 MEQ tablet Take 0.5 tablets by mouth daily. 10/31/20  Yes [provider]  metoprolol succinate (TOPROL-XL) 50 MG 24  hr tablet Take 75 mg by mouth daily. Takes 1.5 tabs TID (72m 3 times daily)   Yes [provider]  pantoprazole (PROTONIX) 40 MG tablet TAKE 1 TABLET BY MOUTH EVERY DAY 06/02/20  Yes [name], JFransisca Kaufmann MD  sildenafil (REVATIO) 20 MG tablet Take 1 tablet (20 mg total) by mouth 3 (three) times daily. 08/24/20  Yes MLarey Dresser MD  torsemide (DEMADEX) 20 MG tablet Take 3 tablets (60 mg total) by mouth 2 (two) times daily. 09/29/20  Yes MLarey Dresser MD  acetaminophen (TYLENOL) 325 MG  tablet Take 2 tablets (650 mg total) by mouth every 6 (six) hours as needed for mild pain (or Fever >/= 101). 01/01/19   ERoxan Hockey MD  atorvastatin (LIPITOR) 20 MG tablet TAKE 1 TABLET BY MOUTH EVERY DAY 03/19/20   [name], JFransisca Kaufmann MD  benzonatate (TESSALON) 100 MG capsule Take 1 capsule (100 mg total) by mouth 2 (two) times daily as needed for cough. 06/25/20   GPatrecia Pour MD  Dulaglutide (TRULICITY) 1.5 MOT/1.5BWSOPN Inject 1.5 mg into the skin once a week. 12/20/19   [name], JFransisca Kaufmann MD  ertugliflozin L-PyroglutamicAc (STEGLATRO) 5 MG TABS tablet Take 5 mg by mouth daily.    [provider]  ferrous sulfate 325 (65 FE) MG tablet Take 325 mg by mouth daily with breakfast.     [provider]  glucose blood (CONTOUR NEXT TEST) test strip Test BS TID Dx E11.29 05/22/20   [name], JFransisca Kaufmann MD  Insulin Glargine (LANTUS SOLOSTAR [place]) Inject into the skin. 16 units    [provider]  insulin glargine-yfgn (SEMGLEE) 100 UNIT/ML Pen Inject 16 Units into the skin at bedtime.    [provider]  Insulin Pen Needle 32G X 4 MM MISC Use to give insulin daily Dx E11.29 04/15/19   FCharlynne Cousins MD  melatonin 5 MG TABS Take 5 mg by mouth at bedtime as needed.    [provider]  metoprolol succinate (TOPROL-XL) 25 MG 24 hr tablet Take 1 tablet (25 mg total) by mouth daily. 10/25/20   TOrson Eva MD  OXYGEN Inhale into the lungs. Continuous    [provider]  polyethylene glycol (MIRALAX / GLYCOLAX) 17 g packet Take 17 g by mouth daily. 01/01/19   ERoxan Hockey MD  warfarin (COUMADIN) 7.5 MG tablet Take 7.5 mg by mouth daily.    [provider]  diltiazem ([facility] 240 MG 24 hr capsule TAKE 1 CAPSULE DAILY 10/19/12 02/17/13  WVernie Shanks MD     Family History  Problem Relation Age of Onset   Breast cancer Mother    Diabetes Mother    Heart attack Father     Social History   Socioeconomic History   Marital  status: Divorced    Spouse name: Not on file   Number of children: 1   Years of education: 123  Highest education level: 12th grade  Occupational History    Employer: [employer]  Tobacco Use   Smoking status: Former    Types: Cigarettes    Quit date: 08/06/1983    Years since quitting: 37.3   Smokeless tobacco: Never  Vaping Use   Vaping Use: Never used  Substance and Sexual Activity   Alcohol use: No   Drug use: No   Sexual activity: Not Currently  Other Topics Concern   Not on file  Social History Narrative   Lives alone   Social Determinants of  Health   Financial Resource Strain: Low Risk    Difficulty of Paying Living Expenses: Not very hard  Food Insecurity: No Food Insecurity   Worried About Running Out of Food in the Last Year: Never true   Ran Out of Food in the Last Year: Never true  Transportation Needs: No Transportation Needs   Lack of Transportation (Medical): No   Lack of Transportation (Non-Medical): No  Physical Activity: Inactive   Days of Exercise per Week: 0 days   Minutes of Exercise per Session: 0 min  Stress: Not on file  Social Connections: Socially Isolated   Frequency of Communication with Friends and Family: More than three times a week   Frequency of Social Gatherings with Friends and Family: More than three times a week   Attends Religious Services: Never   Marine scientist or Organizations: No   Attends Archivist Meetings: Never   Marital Status: Divorced      Review of Systems: A 12 point ROS discussed and pertinent positives are indicated in the HPI above.  All other systems are negative.    Vital Signs: BP 130/69 (BP Location: Left Wrist)   Pulse 60   Temp (!) 96.7 F (35.9 C) (Temporal)   Resp 16   Ht 5' 7"  (1.702 m)   Wt 228 lb 13.4 oz (103.8 kg)   SpO2 99%   BMI 35.84 kg/m   Physical Exam Vitals reviewed.  HENT:     Mouth/Throat:     Mouth: Mucous membranes are moist.  Cardiovascular:      Rate and Rhythm: Normal rate and regular rhythm.     Heart sounds: Normal heart sounds.  Pulmonary:     Effort: Pulmonary effort is normal.     Breath sounds: Normal breath sounds.  Abdominal:     Palpations: Abdomen is soft.  Musculoskeletal:        General: Normal range of motion.  Skin:    General: Skin is warm.  Neurological:     Mental Status: He is alert and oriented to person, place, and time.  Psychiatric:        Behavior: Behavior normal.    Imaging: US Paracentesis  Result Date: 11/24/2020 INDICATION: Ascites, cirrhosis due to NASH, history colon cancer EXAM: ULTRASOUND GUIDED DIAGNOSTIC AND THERAPEUTIC PARACENTESIS MEDICATIONS: None. COMPLICATIONS: None immediate. PROCEDURE: Informed written consent was obtained from the patient after a discussion of the risks, benefits and alternatives to treatment. A timeout was performed prior to the initiation of the procedure. Initial ultrasound scanning demonstrates a moderate amount of ascites within the right lower abdominal quadrant. The right lower abdomen was prepped and draped in the usual sterile fashion. 1% lidocaine was used for local anesthesia. Following this, a 5 [place] Yueh catheter was introduced. An ultrasound image was saved for documentation purposes. The paracentesis was performed. The catheter was removed and a dressing was applied. The patient tolerated the procedure well without immediate post procedural complication. FINDINGS: A total of approximately 2.4 L of yellow ascitic fluid was removed. Samples were sent to the laboratory as requested by the clinical team. IMPRESSION: Successful ultrasound-guided paracentesis yielding 2.4 liters of peritoneal fluid. Electronically Signed   By: Lavonia Dana M.D.   On: 11/24/2020 14:57   DG CHEST PORT 1 VIEW  Result Date: 11/27/2020 CLINICAL DATA:  Central line placement. EXAM: PORTABLE CHEST 1 VIEW COMPARISON:  Same day. FINDINGS: Interval placement right internal jugular catheter  with distal tip in expected position of  the SVC. No pneumothorax is noted. IMPRESSION: Interval placement of right internal jugular catheter with distal tip in expected position of the SVC. No pneumothorax is noted. Electronically Signed   By: Marijo Conception M.D.   On: 11/27/2020 13:51   DG CHEST PORT 1 VIEW  Result Date: 11/27/2020 CLINICAL DATA:  Dyspnea. Cough and congestion and shortness of breath. EXAM: PORTABLE CHEST 1 VIEW COMPARISON:  11/25/2020 and older studies. FINDINGS: Cardiac silhouette is borderline enlarged. No mediastinal or hilar masses. There is hazy opacity throughout right hemithorax most prominent at the base, consistent with a combination of layering pleural fluid and airspace lung opacity. This has increased from the prior exam. There is also opacity at the left lung base, not well defined, consistent with atelectasis with a probable small effusion. Mid to upper left lung is clear. No pneumothorax. Right-sided PICC is stable. IMPRESSION: 1. Interval worsening of lung aeration on the right, although the apparent change may be due to differences in patient positioning and technique. Right-sided opacity is consistent with a combination of layering right pleural fluid and parenchymal lung opacity, the latter finding which may reflect asymmetric edema or pneumonia. 2. Mild stable opacity at the left lung base consistent with atelectasis with a suspected small effusion. Electronically Signed   By: Lajean Manes M.D.   On: 11/27/2020 10:24   DG CHEST PORT 1 VIEW  Result Date: 11/25/2020 CLINICAL DATA:  PICC line placement EXAM: PORTABLE CHEST 1 VIEW COMPARISON:  11/22/2020 FINDINGS: Right upper extremity central venous catheter tip projects over SVC origin. Cardiomegaly with vascular congestion, pulmonary edema and pleural effusions without significant change. Aortic atherosclerosis. IMPRESSION: 1. Right upper extremity central venous catheter tip overlies the SVC origin 2. Cardiomegaly  with continued vascular congestion, edema and pleural effusions. Electronically Signed   By: Donavan Foil M.D.   On: 11/25/2020 22:30   DG Chest Port 1 View  Result Date: 11/22/2020 CLINICAL DATA:  swelling and shortness of breath. EXAM: PORTABLE CHEST 1 VIEW COMPARISON:  Portable chest 10/15/2020. FINDINGS: The heart is enlarged. Mild perihilar vascular congestion continues to be seen with mild lung base interstitial edema and small pleural effusions. No focal pneumonia is seen. Similar findings were noted previously. Osteopenia. IMPRESSION: Cardiomegaly with mild perihilar vascular congestion and basilar interstitial edema with small pleural effusions. The overall aeration seems unchanged. Electronically Signed   By: Telford Nab M.D.   On: 11/22/2020 04:01   ECHOCARDIOGRAM COMPLETE  Result Date: 11/22/2020    ECHOCARDIOGRAM REPORT   Patient Name:   JEDADIAH ABDALLAH Date of Exam: 11/22/2020 Medical Rec #:  [id number]      Height:       64.0 in Accession #:    [id number]     Weight:       287.0 lb Date of Birth:  Dec 19, 1950      BSA:          2.281 m Patient Age:    8 years       BP:           136/88 mmHg Patient Gender: M              HR:           61 bpm. Exam Location:  [facility] Procedure: 2D Echo, Cardiac Doppler, Color Doppler and Intracardiac            Opacification Agent Indications:    I50.40* Unspecified combined systolic (congestive) and diastolic                 (  congestive) heart failure  History:        Patient has prior history of Echocardiogram examinations, most                 recent 10/06/2020. CHF and Cardiomyopathy, Abnormal ECG,                 Pulmonary HTN, Arrythmias:Atrial Fibrillation; Risk                 Factors:Diabetes, Hypertension and Dyslipidemia. RV failure.  Sonographer:    Roseanna Rainbow RDCS Referring Phys: [id number] OLADAPO ADEFESO  Sonographer Comments: Technically difficult study due to poor echo windows and patient is morbidly obese. Patient moving, grunting and  coughing throughout exam. Patient having back pain. Study interrupted by consult. IMPRESSIONS  1. Left ventricular ejection fraction, by estimation, is 55 to 60%. The left ventricle has normal function. The left ventricle has no regional wall motion abnormalities. The left ventricular internal cavity size was moderately dilated. There is mild left ventricular hypertrophy. Left ventricular diastolic parameters are indeterminate. There is the interventricular septum is flattened in systole and diastole, consistent with right ventricular pressure and volume overload.  2. Right ventricular systolic function is severely reduced. The right ventricular size is severely enlarged. There is severely elevated pulmonary artery systolic pressure.  3. Left atrial size was moderately dilated.  4. Right atrial size was severely dilated.  5. The mitral valve is abnormal. Mild to moderate mitral valve regurgitation. No evidence of mitral stenosis. Moderate mitral annular calcification.  6. Tricuspid valve regurgitation is moderate.  7. The aortic valve is calcified. Aortic valve regurgitation is not visualized. No aortic stenosis is present.  8. The inferior vena cava is dilated in size with <50% respiratory variability, suggesting right atrial pressure of 15 mmHg. FINDINGS  Left Ventricle: Left ventricular ejection fraction, by estimation, is 55 to 60%. The left ventricle has normal function. The left ventricle has no regional wall motion abnormalities. Definity contrast agent was given IV to delineate the left ventricular  endocardial borders. The left ventricular internal cavity size was moderately dilated. There is mild left ventricular hypertrophy. The interventricular septum is flattened in systole and diastole, consistent with right ventricular pressure and volume overload. Left ventricular diastolic parameters are indeterminate. Right Ventricle: The right ventricular size is severely enlarged. Right ventricular systolic  function is severely reduced. There is severely elevated pulmonary artery systolic pressure. The tricuspid regurgitant velocity is 4.02 m/s, and with an assumed right atrial pressure of 15 mmHg, the estimated right ventricular systolic pressure is 66.4 mmHg. Left Atrium: Left atrial size was moderately dilated. Right Atrium: Right atrial size was severely dilated. Pericardium: Trivial pericardial effusion is present. Mitral Valve: The mitral valve is abnormal. Moderate mitral annular calcification. Mild to moderate mitral valve regurgitation. No evidence of mitral valve stenosis. Tricuspid Valve: The tricuspid valve is normal in structure. Tricuspid valve regurgitation is moderate . No evidence of tricuspid stenosis. Aortic Valve: The aortic valve is calcified. Aortic valve regurgitation is not visualized. No aortic stenosis is present. Pulmonic Valve: The pulmonic valve was normal in structure. Pulmonic valve regurgitation is not visualized. No evidence of pulmonic stenosis. Aorta: The aortic root is normal in size and structure. Venous: The inferior vena cava is dilated in size with less than 50% respiratory variability, suggesting right atrial pressure of 15 mmHg. IAS/Shunts: No atrial level shunt detected by color flow Doppler.  MR Peak grad:   104.4 mmHg   TRICUSPID VALVE MR Mean grad:  70.0 mmHg    TR Peak grad:   64.6 mmHg MR Vmax:        511.00 cm/s  TR Vmax:        402.00 cm/s MR Vmean:       394.0 cm/s MR PISA:        0.25 cm MR PISA Radius: 0.20 cm Kirk Ruths MD Electronically signed by Kirk Ruths MD Signature Date/Time: 11/22/2020/11:35:35 AM    Final    [place] ASCITES (ABDOMEN LIMITED)  Result Date: 11/22/2020 CLINICAL DATA:  Surgeon for ascites. EXAM: LIMITED ABDOMEN ULTRASOUND FOR ASCITES TECHNIQUE: Limited ultrasound survey for ascites was performed in all four abdominal quadrants. COMPARISON:  None. FINDINGS: There is mild free fluid in the right upper quadrant. There is moderate free  fluid in the right lower quadrant, left lower quadrant and left upper quadrant. IMPRESSION: Mild to moderate 4 quadrant ascites. Electronically Signed   By: Audie Pinto M.D.   On: 11/22/2020 10:12   [place] EKG SITE RITE  Result Date: 11/25/2020 If Site Rite image not attached, placement could not be confirmed due to current cardiac rhythm.   Labs:  CBC: Recent Labs    12/06/20 0411 12/07/20 0430 12/08/20 1055 12/09/20 0506  WBC 11.2* 12.0* 12.1* 11.0*  HGB 10.8* 10.7* 11.0* 10.9*  HCT 38.4* 36.8* 38.0* 38.1*  PLT 171 169 129* 125*    COAGS: Recent Labs    11/23/20 0422 11/24/20 0620 12/04/20 0418 12/05/20 0410 12/06/20 0411 12/07/20 0430  INR 3.0*   < > 1.3* 1.4* 1.4* 1.9*  APTT 57*  --   --   --   --   --    < > = values in this interval not displayed.    BMP: Recent Labs    12/20/19 1624 03/19/20 1311 12/06/20 0411 12/07/20 0649 12/08/20 1055 12/09/20 0953  NA 143   < > 132* 128* 130* 132*  K 4.0   < > 4.3 4.6 4.4 5.0  CL 103   < > 97* 95* 96* 98  CO2 21   < > 27 24 25 24   GLUCOSE 254*   < > 141* 160* 100* 120*  BUN 56*   < > 38* 74* 59* 77*  CALCIUM 9.2   < > 8.6* 8.5* 8.6* 8.7*  CREATININE 1.69*   < > 2.44* 3.80* 3.87* 5.01*  GFRNONAA 41*   < > 28* 17* 16* 12*  GFRAA 48*  --   --   --   --   --    < > = values in this interval not displayed.    LIVER FUNCTION TESTS: Recent Labs    10/23/20 0521 10/24/20 0433 11/12/20 1537 11/22/20 0424 11/23/20 0422 11/27/20 1638 12/06/20 0411 12/07/20 0649 12/08/20 1055 12/09/20 0953  BILITOT 1.1 0.7 0.7 0.9 1.0  --   --   --   --   --   AST 13* 18 33 19 16  --   --   --   --   --   ALT 11 11 30 16 14   --   --   --   --   --   ALKPHOS 65 67  --  79 70  --   --   --   --   --   PROT 5.9* 6.0* 6.3 6.8 6.4*  --   --   --   --   --   ALBUMIN 3.1* 3.0*  --  3.4* 3.2*   < >  3.3* 3.3* 3.2* 3.3*   < > = values in this interval not displayed.    TUMOR MARKERS: No results for input(s): AFPTM, CEA, CA199,  CHROMGRNA in the last 8760 hours.  Assessment and Plan:  A/CKD Temp placed 11/27/20 Need for ongoing dialysis Scheduled for tunneled dialysis catheter placement in IR 12/7 Risks and benefits discussed with the patient including, but not limited to bleeding, infection, vascular injury, pneumothorax which may require chest tube placement, air embolism or even death  All of the patient's questions were answered, patient is agreeable to proceed. Consent signed and in chart.   Thank you for this interesting consult.  I greatly enjoyed meeting William Flores and look forward to participating in their care.  A copy of this report was sent to the requesting provider on this date.  Electronically Signed: Lavonia Drafts, PA-C 12/09/2020, 2:20 PM   I spent a total of 20 Minutes    in face to face in clinical consultation, greater than 50% of which was counseling/coordinating care for tun HD

## 2020-12-09 NOTE — Progress Notes (Signed)
ANTICOAGULATION CONSULT NOTE  Pharmacy Consult for warfarin > heparin while on CRRT Indication: atrial fibrillation  Allergies  Allergen Reactions   Codeine     Headache     Patient Measurements: Height: 5' 7"  (170.2 cm) Weight: 103.8 kg (228 lb 13.4 oz) IBW/kg (Calculated) : 66.1  Vital Signs: Temp: 97.8 F (36.6 C) (12/07 2017) Temp Source: Oral (12/07 2017) BP: 107/66 (12/07 2017) Pulse Rate: 69 (12/07 2017)  Labs: Recent Labs    12/07/20 0430 12/07/20 0555 12/07/20 0649 12/08/20 1055 12/09/20 0506 12/09/20 0953 12/09/20 2007  HGB 10.7*  --   --  11.0* 10.9*  --   --   HCT 36.8*  --   --  38.0* 38.1*  --   --   PLT 169  --   --  129* 125*  --   --   LABPROT 21.4*  --   --   --   --   --   --   INR 1.9*  --   --   --   --   --   --   HEPARINUNFRC >1.10*   < >  --  0.46 0.23*  --  0.31  CREATININE  --   --  3.80* 3.87*  --  5.01*  --    < > = values in this interval not displayed.     Estimated Creatinine Clearance: 16.2 mL/min (A) (by C-G formula based on SCr of 5.01 mg/dL (H)).   Medical History: Past Medical History:  Diagnosis Date   CAD (coronary artery disease)    LHC 2/08:  mRCA 50%, o/w no sig CAD, EF 25%   Cardiomyopathy ([place])    EF 35-40% in 2/16 in setting of AF with RVR >> echo 5/16 with improved LVF with EF 65-70%   Chronic atrial fibrillation (HCC)    Chronic diastolic heart failure (HCC)    HFPF - EF ~65-70% (OFTEN EXACERBATED BY AFIB)   CKD (chronic kidney disease)    hx of worsening renal failure and hyperK+ in setting of acute diast CHF >> required CVVHD in 4/16 and dialysis x 1 in 5/16   CKD stage 3 due to type 2 diabetes mellitus ([place]) 09/17/2015   Colon cancer ([place])    Colon polyps 09/21/2012   Tubular adenoma   Diabetes ([place])    Hyperlipidemia    Hypertension    Obesity hypoventilation syndrome ([place])    Renal insufficiency    Sleep apnea     Medications:   sodium chloride Stopped (12/05/20 1754)   heparin 1,950 Units/hr  (12/09/20 1620)     Assessment: 32 yoM on warfarin PTA for hx AFib admitted for RV failure and AKI ultimately started on CRRT and IV heparin for anticoagulation. Patient recently admitted here in October for GI bleed and per GI recommended INR 2-2.5.  Heparin level 0.31 (on 1950 units/hr) No issues with the line  Goal of Therapy:  Heparin Level goal 0.3-0.5 Monitor platelets by anticoagulation protocol: Yes   Plan:  Continue heparin 1950 units/hr  Daily heparin level and CBC F/u plans to transition to Eliquis once HD access, etc are completed.  Copay is now affordable at $9.85.  Thank you for allowing pharmacy to be a part of this patient's care.  Donnald Garre, PharmD Clinical Pharmacist  Please check AMION for all [place] numbers After 10:00 PM, call [facility] [phone]

## 2020-12-09 NOTE — TOC Progression Note (Signed)
Transition of Care [facility]) - Progression Note    Patient Details  Name: William Flores MRN: [id number] Date of Birth: Jun 30, 1950  Transition of Care [facility] CM/SW [place], [place] Phone Number: 12/09/2020, 4:04 PM  Clinical Narrative:    HF CSW informed [facility] about [name] HD time and days at [facility] in [place]. Awaiting catheter placement before [name] is medically ready for discharge. Attending MD aware of needed COVID test before DC.  CSW will continue to follow throughout discharge.   Expected Discharge Plan: [place] Barriers to Discharge: Continued Medical Work up  Expected Discharge Plan and Services Expected Discharge Plan: [place] In-house Referral: Clinical Social Work Discharge Planning Services: CM Consult   Living arrangements for the past 2 months: [place]                                       Social Determinants of Health (SDOH) Interventions Food Insecurity Interventions: Intervention Not Indicated Financial Strain Interventions: Intervention Not Indicated Housing Interventions: Intervention Not Indicated, Other (Comment) (Patient is from SNF, [facility]) Transportation Interventions: Intervention Not Indicated (Pt reports the SNF has been getting him to appointments)  Readmission Risk Interventions Readmission Risk Prevention Plan 11/23/2020 10/24/2020 10/15/2020  Transportation Screening Complete Complete Complete  PCP or Specialist Appt within 3-5 Days - - -  [place] or [place] - - Complete  Social Work Consult for [place] Planning/Counseling - - Complete  Palliative Care Screening - - Not Applicable  Medication Review Press photographer) Complete Complete Complete  PCP or Specialist appointment within 3-5 days of discharge Complete (No Data) -  [place] or Home Care Consult Complete Complete -  SW Recovery Care/Counseling Consult Complete Complete -   Palliative Care Screening - Not Applicable -  [place] Complete Complete -  Some recent data might be hidden   Daksha Koone, MSW, LCSWA [phone] Heart Failure Social Worker

## 2020-12-09 NOTE — Progress Notes (Addendum)
PT Cancellation Note  Patient Details Name: William Flores MRN: [id number] DOB: 14-May-1950   Cancelled Treatment:    Reason Eval/Treat Not Completed: Patient at procedure or test/unavailable. Pt off floor at HD treatment. Will plan to follow-up later as time permits.  Addendum: Attempted again around 16:10 this afternoon, pt was sleeping and requested PT check back later if time permits.    Moishe Spice, PT, DPT Acute Rehabilitation Services  Pager: [phone] Office: [place] 12/09/2020, 11:25 AM

## 2020-12-10 ENCOUNTER — Inpatient Hospital Stay (HOSPITAL_COMMUNITY): Payer: Medicare Other

## 2020-12-10 DIAGNOSIS — I50813 Acute on chronic right heart failure: Secondary | ICD-10-CM | POA: Diagnosis not present

## 2020-12-10 DIAGNOSIS — I4891 Unspecified atrial fibrillation: Secondary | ICD-10-CM | POA: Diagnosis not present

## 2020-12-10 DIAGNOSIS — N186 End stage renal disease: Secondary | ICD-10-CM | POA: Diagnosis not present

## 2020-12-10 HISTORY — PX: IR FLUORO GUIDE CV LINE RIGHT: IMG2283

## 2020-12-10 HISTORY — PX: IR PERC TUN PERIT CATH WO PORT S&I /IMAG: IMG2327

## 2020-12-10 LAB — HEPARIN LEVEL (UNFRACTIONATED)
Heparin Unfractionated: 0.23 IU/mL — ABNORMAL LOW (ref 0.30–0.70)
Heparin Unfractionated: 0.2 IU/mL — ABNORMAL LOW (ref 0.30–0.70)

## 2020-12-10 LAB — CBC
HCT: 37.2 % — ABNORMAL LOW (ref 39.0–52.0)
Hemoglobin: 10.7 g/dL — ABNORMAL LOW (ref 13.0–17.0)
MCH: 27 pg (ref 26.0–34.0)
MCHC: 28.8 g/dL — ABNORMAL LOW (ref 30.0–36.0)
MCV: 93.9 fL (ref 80.0–100.0)
Platelets: 109 10*3/uL — ABNORMAL LOW (ref 150–400)
RBC: 3.96 MIL/uL — ABNORMAL LOW (ref 4.22–5.81)
RDW: 24.2 % — ABNORMAL HIGH (ref 11.5–15.5)
WBC: 8.3 10*3/uL (ref 4.0–10.5)
nRBC: 0 % (ref 0.0–0.2)

## 2020-12-10 LAB — GLUCOSE, CAPILLARY
Glucose-Capillary: 138 mg/dL — ABNORMAL HIGH (ref 70–99)
Glucose-Capillary: 151 mg/dL — ABNORMAL HIGH (ref 70–99)
Glucose-Capillary: 181 mg/dL — ABNORMAL HIGH (ref 70–99)
Glucose-Capillary: 149 mg/dL — ABNORMAL HIGH (ref 70–99)

## 2020-12-10 MED ORDER — HEPARIN (PORCINE) 25000 UT/250ML-% IV SOLN
2250.0000 [IU]/h | INTRAVENOUS | Status: DC
  Administered 2020-12-10 (×2): 2100 [IU]/h via INTRAVENOUS
  Filled 2020-12-10: qty 250

## 2020-12-10 MED ORDER — LIDOCAINE-EPINEPHRINE 1 %-1:100000 IJ SOLN
INTRAMUSCULAR | Status: AC | PRN
  Administered 2020-12-10: 20 mL via INTRADERMAL

## 2020-12-10 MED ORDER — LIDOCAINE-EPINEPHRINE 1 %-1:100000 IJ SOLN
INTRAMUSCULAR | Status: AC
  Filled 2020-12-10: qty 1

## 2020-12-10 MED ORDER — HEPARIN SODIUM (PORCINE) 1000 UNIT/ML IJ SOLN
INTRAMUSCULAR | Status: AC | PRN
  Administered 2020-12-10: 3200 [IU] via INTRAVENOUS

## 2020-12-10 MED ORDER — APIXABAN 5 MG PO TABS
5.0000 mg | ORAL_TABLET | Freq: Two times a day (BID) | ORAL | Status: DC
  Administered 2020-12-11 (×2): 5 mg via ORAL
  Filled 2020-12-10 (×2): qty 1

## 2020-12-10 MED ORDER — CEFAZOLIN SODIUM-DEXTROSE 2-4 GM/100ML-% IV SOLN
INTRAVENOUS | Status: AC | PRN
  Administered 2020-12-10: 2 g via INTRAVENOUS

## 2020-12-10 MED ORDER — CEFAZOLIN SODIUM-DEXTROSE 2-4 GM/100ML-% IV SOLN
INTRAVENOUS | Status: AC
  Filled 2020-12-10: qty 100

## 2020-12-10 MED ORDER — HEPARIN SODIUM (PORCINE) 1000 UNIT/ML IJ SOLN
INTRAMUSCULAR | Status: AC
  Filled 2020-12-10: qty 10

## 2020-12-10 MED ORDER — FENTANYL CITRATE (PF) 100 MCG/2ML IJ SOLN
INTRAMUSCULAR | Status: AC | PRN
  Administered 2020-12-10: 50 ug via INTRAVENOUS

## 2020-12-10 MED ORDER — CALCIUM ACETATE (PHOS BINDER) 667 MG PO CAPS
667.0000 mg | ORAL_CAPSULE | Freq: Three times a day (TID) | ORAL | Status: DC
  Administered 2020-12-10 – 2020-12-11 (×5): 667 mg via ORAL
  Filled 2020-12-10 (×5): qty 1

## 2020-12-10 MED ORDER — MIDAZOLAM HCL 2 MG/2ML IJ SOLN
INTRAMUSCULAR | Status: AC | PRN
  Administered 2020-12-10: 1 mg via INTRAVENOUS

## 2020-12-10 MED ORDER — FENTANYL CITRATE (PF) 100 MCG/2ML IJ SOLN
INTRAMUSCULAR | Status: AC
  Filled 2020-12-10: qty 2

## 2020-12-10 MED ORDER — MIDAZOLAM HCL 2 MG/2ML IJ SOLN
INTRAMUSCULAR | Status: AC
  Filled 2020-12-10: qty 2

## 2020-12-10 MED ORDER — CEFAZOLIN SODIUM-DEXTROSE 2-4 GM/100ML-% IV SOLN: 2.0000 g | INTRAVENOUS | Status: AC

## 2020-12-10 NOTE — Progress Notes (Signed)
Attempt to round on patient today for renal education. Patient reports that he is tired from his procedure and request that I return tomorrow. This RN with plans to round tomorrow morning. Patient with no other needs at this time.  Dorthey Sawyer, RN  Dialysis Nurse Coordinator [phone]

## 2020-12-10 NOTE — Progress Notes (Signed)
PT Cancellation Note  Patient Details Name: MILUS FRITZE MRN: [id number] DOB: 26-Feb-1950   Cancelled Treatment:    Reason Eval/Treat Not Completed: Patient declined, no reason specified;Fatigue/lethargy limiting ability to participate. Attempted 3x this afternoon, with pt reporting too be too tired to participate and just wanting to sleep or getting Unna boots replaced. Will plan to follow-up tomorrow as able.   Moishe Spice, PT, DPT Acute Rehabilitation Services  Pager: [phone] Office: [place] 12/10/2020, 4:07 PM

## 2020-12-10 NOTE — Progress Notes (Addendum)
ANTICOAGULATION CONSULT NOTE  Pharmacy Consult for warfarin > heparin  Indication: atrial fibrillation  Allergies  Allergen Reactions   Codeine     Headache     Patient Measurements: Height: 5' 7"  (170.2 cm) Weight: 104 kg (229 lb 4.5 oz) IBW/kg (Calculated) : 66.1 Heparin dosing wt: 90 kg  Vital Signs: Temp: 97.6 F (36.4 C) (12/08 0354) Temp Source: Oral (12/08 0354) BP: 112/62 (12/08 1040) Pulse Rate: 78 (12/08 1040)  Labs: Recent Labs    12/08/20 1055 12/09/20 0506 12/09/20 0953 12/09/20 2007 12/10/20 0455  HGB 11.0* 10.9*  --   --  10.7*  HCT 38.0* 38.1*  --   --  37.2*  PLT 129* 125*  --   --  109*  HEPARINUNFRC 0.46 0.23*  --  0.31 0.23*  CREATININE 3.87*  --  5.01*  --   --      Estimated Creatinine Clearance: 16.2 mL/min (A) (by C-G formula based on SCr of 5.01 mg/dL (H)).   Assessment: 48 yoM on warfarin PTA for hx AFib admitted for RV failure and AKI ultimately started on CRRT and IV heparin for anticoagulation. Patient recently admitted here in October for GI bleed and per GI recommended INR 2-2.5.  Heparin level low early this AM, and rate increased to 2100 units/hr.  Heparin then turned off for trip to IR to place tunneled catheter.  Heparin to be resumed at noon per MD orders.  Goal of Therapy:  Heparin level 0.3-0.5 units/ml Monitor platelets by anticoagulation protocol: Yes   Plan:  Resume IV heparin at noon at rate of 2100 units/hr. Check heparin level 8 hrs after gtt restarts. Daily heparin level and CBC. Could not afford Eliquis in the past, but now copay is $9.85 with his Medicare plan. Discussed with Dr. Erlinda Hong this afternoon, will plan to continue IV heparin overnight and then start Eliquis and d/c IV heparin tomorrow AM.  [place] Crane, Vena Austria, BCPS, [facility] Clinical Pharmacist  12/10/2020 10:59 AM   [facility] pharmacy phone numbers are listed on [email or web address]

## 2020-12-10 NOTE — Sedation Documentation (Signed)
Heparin gtt to be restarted at noon per Dr. Laurence Ferrari

## 2020-12-10 NOTE — Progress Notes (Signed)
Occupational Therapy Treatment Patient Details Name: William Flores MRN: [id number] DOB: 09-Feb-1950 Today's Date: 12/10/2020   History of present illness Pt is a 70 y.o. male admitted from [facility] on 40/81/44 with acute exacerbation of CHF, severe pulmonary hypertension. S/p R paracentesis on 11/22. CRRT initiated 11/25, now on IHD 12/06/20. PMH includes a-fib, HTN, DM, CKD stage III, CAD, HLD, sleep apnea, cardiomyopathy, cirrhosis, acute on chronic respiratory failure (2L O2 baseline), obesity.   OT comments  On entry, pt requesting bathroom assistance. Pt able to progress mobility to/from bathroom using RW at min guard with improved DME control. Pt continues to require assistance for toileting hygiene citing continued R hand pain. Pt endorses fatigue after completing these ADL tasks, as well as dizziness with BP WFL. Reinforced OOB activities and progression of endurance.    Recommendations for follow up therapy are one component of a multi-disciplinary discharge planning process, led by the attending physician.  Recommendations may be updated based on patient status, additional functional criteria and insurance authorization.    Follow Up Recommendations  Skilled nursing-short term rehab (<3 hours/day)    Assistance Recommended at Discharge Frequent or constant Supervision/Assistance  Equipment Recommendations  BSC/3in1    Recommendations for Other Services      Precautions / Restrictions Precautions Precautions: Fall;Other (comment) Precaution Comments: Watch SpO2 (wears 2L O2 baseline per chart) Restrictions Weight Bearing Restrictions: No       Mobility Bed Mobility Overal bed mobility: Needs Assistance Bed Mobility: Supine to Sit;Sit to Supine     Supine to sit: Min assist;HOB elevated Sit to supine: Supervision   General bed mobility comments: Min A by hooking arm around therapist elbow to pull self up to avoid R hand pain    Transfers Overall transfer level:  Needs assistance Equipment used: Rolling walker (2 wheels) Transfers: Sit to/from Stand Sit to Stand: Supervision           General transfer comment: no assist needed     Balance Overall balance assessment: Needs assistance Sitting-balance support: No upper extremity supported;Feet supported Sitting balance-Leahy Scale: Good     Standing balance support: Single extremity supported;Bilateral upper extremity supported;During functional activity Standing balance-Leahy Scale: Fair                             ADL either performed or assessed with clinical judgement   ADL Overall ADL's : Needs assistance/impaired                         Toilet Transfer: Min guard;Ambulation;Regular Toilet;Rolling walker (2 wheels) Toilet Transfer Details (indicate cue type and reason): no overt LOB, better manuevering with RW now in room Toileting- Clothing Manipulation and Hygiene: Minimal assistance;Sit to/from stand Toileting - Clothing Manipulation Details (indicate cue type and reason): able to perform posterior hygiene seated on toilet, Min A for thoroughness       General ADL Comments: Improving mobility, still reports R hand pain as limiting factor though improved use noted in session    Extremity/Trunk Assessment Upper Extremity Assessment Upper Extremity Assessment: RUE deficits/detail RUE Deficits / Details: new pain on R side of hand, able to make light fist and hold light items but difficulty grasping tightly; imaging negative for fx RUE Coordination: decreased fine motor   Lower Extremity Assessment Lower Extremity Assessment: Defer to PT evaluation        Vision   Vision Assessment?: No apparent visual  deficits   Perception     Praxis      Cognition Arousal/Alertness: Awake/alert Behavior During Therapy: WFL for tasks assessed/performed Overall Cognitive Status: Within Functional Limits for tasks assessed                                  General Comments: Pt pleasant, follows directions consistently , does benefit from cues to stay on task; does benefit from cues for problem solving          Exercises     Shoulder Instructions       General Comments Pt reports mild dizziness with movement after procedure today, BP WFL    Pertinent Vitals/ Pain       Pain Assessment: Faces Faces Pain Scale: Hurts little more Pain Location: R hand Pain Descriptors / Indicators: Grimacing;Guarding;Sore Pain Intervention(s): Monitored during session  Home Living                                          Prior Functioning/Environment              Frequency  Min 2X/week        Progress Toward Goals  OT Goals(current goals can now be found in the care plan section)  Progress towards OT goals: Progressing toward goals  Acute Rehab OT Goals Patient Stated Goal: decrease fatigue and R hand pain OT Goal Formulation: With patient Time For Goal Achievement: 12/14/20 Potential to Achieve Goals: Good ADL Goals Pt Will Perform Lower Body Bathing: with set-up;sitting/lateral leans;sit to/from stand Pt Will Perform Lower Body Dressing: with set-up;sitting/lateral leans;sit to/from stand;with adaptive equipment Pt Will Transfer to Toilet: with set-up;stand pivot transfer;bedside commode Pt/caregiver will Perform Home Exercise Program: Increased strength;Both right and left upper extremity;With theraband;Independently;With written HEP provided  Plan Discharge plan remains appropriate    Co-evaluation                 AM-PAC OT "6 Clicks" Daily Activity     Outcome Measure   Help from another person eating meals?: A Little Help from another person taking care of personal grooming?: A Little Help from another person toileting, which includes using toliet, bedpan, or urinal?: A Little Help from another person bathing (including washing, rinsing, drying)?: A Lot Help from another person to put on  and taking off regular upper body clothing?: A Little Help from another person to put on and taking off regular lower body clothing?: A Lot 6 Click Score: 16    End of Session Equipment Utilized During Treatment: Rolling walker (2 wheels);Oxygen  OT Visit Diagnosis: Unsteadiness on feet (R26.81);Other abnormalities of gait and mobility (R26.89);Muscle weakness (generalized) (M62.81)   Activity Tolerance Patient tolerated treatment well   Patient Left in bed;with call bell/phone within reach   Nurse Communication Mobility status;Other (comment) (pt request for O2 bubble d/t nasal redness/soreness)        Time: 9211-9417 OT Time Calculation (min): 26 min  Charges: OT General Charges $OT Visit: 1 Visit OT Treatments $Self Care/Home Management : 23-37 mins  Malachy Chamber, OTR/L Acute Rehab Services Office: [phone]   Layla Maw 12/10/2020, 1:59 PM

## 2020-12-10 NOTE — Progress Notes (Signed)
Orthopedic Tech Progress Note Patient Details:  William Flores [birth year] [id number]  Ortho Devices Type of Ortho Device: Haematologist Ortho Device/Splint Location: BLE Ortho Device/Splint Interventions: Ordered, Application, Adjustment   Post Interventions Patient Tolerated: Well Instructions Provided: Care of device Notified RN of new cut on the left lower limb near the top of the foot.  [name] 12/10/2020, 5:07 PM

## 2020-12-10 NOTE — Progress Notes (Signed)
Palliative-   Chart reviewed. Patient progressing with plan for discharge tomorrow. No acute Palliative needs at this time.   Palliative will sing off. Thank you for involving Palliative in this patient's care. Please reconsult if further acute Palliative assistance is needed.   Mariana Kaufman, AGNP-C Palliative Medicine  Please call Palliative Medicine team phone with any questions [phone]. For individual providers please see AMION.  No charge

## 2020-12-10 NOTE — Progress Notes (Signed)
Pt refused BIPAP tonight

## 2020-12-10 NOTE — Progress Notes (Signed)
PROGRESS NOTE    William Flores  CBS:[id number] DOB: Dec 03, 1950 DOA: 11/22/2020 PCP: [name], [name], MD    Chief Complaint  Patient presents with   Groin Swelling    Brief Narrative:  William Flores is an 70 y.o. male nursing home resident with acute on chronic right heart failure cor pulmonale severe pulmonary hypertension, metabolic encephalopathy, acute on chronic anemia chronic with acute respiratory failure, cirrhosis secondary to [facility], chronic atrial fibrillation acute kidney injury, thrombocytopenia diabetes mellitus obstructive sleep apnea who presents with worsening volume overload and pulmonary critical care is asked to transfer to intensive care place a hemodialysis catheter so that he can undergo CRRT. Transferred to [facility] on 12/5.  Plan is for tunnel HD cath on Wednesday if WBC count stable.  11/27/2020 transfer to intensive care unit 11/30 off milrinone, and NE 12/2 transitioning to HD 12/5 transfer to [place] for tunneled HD catheter on 12/8  Subjective:  He is seen after return from catheter placement  Reports right hand  pain for a while, tender to touch right hand  Assessment & Plan:   Principal Problem:   Acute exacerbation of CHF (congestive heart failure) (HCC) Active Problems:   Right heart failure (HCC)   OSA (obstructive sleep apnea)   Anticoagulated on Coumadin   Atrial fibrillation with RVR ([place])   DM (diabetes mellitus), type 2 with renal complications (HCC)   Chronic respiratory failure with hypoxia (HCC)   Essential hypertension   CKD (chronic kidney disease), stage III ([place])   Mixed hyperlipidemia   Morbid obesity with BMI of 45.0-49.9, adult ([place])   Pulmonary hypertension (HCC)   Liver cirrhosis secondary to NASH (HCC)   Elevated brain natriuretic peptide (BNP) level   Thrombocytopenia (HCC)   GERD (gastroesophageal reflux disease)   Insomnia   Pressure injury of skin   Cor pulmonale/RV failure:  chronic hypoxic respiratory  failure on home 02 -cards: Suspects primarily due to OHS/OSA.  -Echo in 4/21 with EF 55-60%, RV severely dilated w/ severe RV dysfunction in the setting of severe pulmonary hypertension - Continue HD per nephrology  - Continues midodrine 15 mg tid for BP support    Pulmonary HTN:  -per cHF team - Continue sildenafil 20 mg tid.  - Continue home oxygen and CPAP at night.      AKI on Stage IIIb CKD now ESRD:  -Anuric and now HD dependent.  - HD per nephrology - planning to convert from temp HD cath to tunneled HD cath on 12/8   Atrial fibrillation: Chronic, unlikely to be able to successfully cardiovert given long-standing atrial fibrillation.   - Continue po amiodarone for rate control for now with soft BP, eventually back to Toprol XL.  - was on heparin gtt for tunneled cath placement, plan to transition to eliquis per cards recommendation ( detail please see cards note from 12/6)    Diabetes: type 2 -SSI   MGUS: Previously followed by hem/onc but has not seen in several years.  Multiple myeloma panel 4/21 with presence of M-spike.   Cirrhosis: ?Due to NAFLD.  hepatic encephalopathy?   11/23 Ammonia 48. Paracentesis 2.4 L this admission. Mental status improved.  - Continue lactulose.  -slightly confused about the time    Anemia: Hgb stable. No obvious source of bleeding.  -s/p IV Fe.     Hand pain-possible pseudogout gout? -? Source of increasing WBC count as this was treated with prednisone -right hand x ray   Obesity/osa on cpap  Body mass index is 36.67 kg/m.   Nutritional Assessment:  The patient's BMI is: Body mass index is 35.91 kg/m.Marland Kitchen  Seen by dietician.  I agree with the assessment and plan as outlined below:  Nutrition Status: Nutrition Problem: Increased nutrient needs Etiology: acute illness Signs/Symptoms: estimated needs Interventions: MVI, Prostat  .     Skin Assessment:  I have examined the patient's skin and I agree with the wound assessment  as performed by the wound care RN as outlined below:  Pressure Ulcer 02/17/14 Stage II -  Partial thickness loss of dermis presenting as a shallow open ulcer with a red, pink wound bed without slough. (Active)  02/17/14 2030  Location: Sacrum  Location Orientation: Bilateral  Staging: Stage II -  Partial thickness loss of dermis presenting as a shallow open ulcer with a red, pink wound bed without slough.  Wound Description (Comments):   Present on Admission: Yes (present on admission to 2central)     Pressure Injury 11/24/20 Coccyx Lower;Mid Stage 1 -  Intact skin with non-blanchable redness of a localized area usually over a bony prominence. SKIN INTACT BUT BOGGY (Active)  11/24/20 2202  Location: Coccyx  Location Orientation: Lower;Mid  Staging: Stage 1 -  Intact skin with non-blanchable redness of a localized area usually over a bony prominence.  Wound Description (Comments): SKIN INTACT BUT BOGGY  Present on Admission: Yes    Unresulted Labs (From admission, onward)     Start     Ordered   12/10/20 2000  Heparin level (unfractionated)  Once-Timed,   TIMED       Question:  Specimen collection method  Answer:  Unit=Unit collect   12/10/20 1049   12/02/20 0500  Heparin level (unfractionated)  Daily,   R     Question:  Specimen collection method  Answer:  Unit=Unit collect   11/30/20 1815   12/01/20 0500  CBC  Daily,   R     Question:  Specimen collection method  Answer:  Unit=Unit collect   11/30/20 0858   Signed and Held  Renal function panel  Once,   R       Question:  Specimen collection method  Answer:  Unit=Unit collect   Signed and Held   Signed and Held  CBC  Once,   R       Question:  Specimen collection method  Answer:  Unit=Unit collect   Signed and Held   Signed and Held  CBC  Once,   R       Question:  Specimen collection method  Answer:  Unit=Unit collect   Signed and Held              DVT prophylaxis: SCDs Start: 11/22/20 0738   Code  Status:full Family Communication: Patient Disposition:   Status is: Inpatient  Dispo: The patient is from: Home              Anticipated d/c is to: SNF              Anticipated d/c date is: transition to eliquis, then SNF placement on 12/9                Consultants:  Critical care Nephrology IR Cardiology/heart failure team Palliative care  Procedures:  Started dialysis   Antimicrobials:   Anti-infectives (From admission, onward)    Start     Dose/Rate Route Frequency Ordered Stop   12/10/20 1000  ceFAZolin (ANCEF) IVPB 2g/100 mL premix  2 g 200 mL/hr over 30 Minutes Intravenous To Radiology 12/10/20 0901 12/11/20 1000   12/10/20 0956  ceFAZolin (ANCEF) IVPB 2g/100 mL premix        over 30 Minutes Intravenous Continuous PRN 12/10/20 1009 12/10/20 0956   12/10/20 0948  ceFAZolin (ANCEF) 2-4 GM/100ML-% IVPB       Note to Pharmacy: Domenick Bookbinder E: cabinet override      12/10/20 0948 12/10/20 2159           Objective: Vitals:   12/10/20 1010 12/10/20 1015 12/10/20 1020 12/10/20 1040  BP: 102/60 105/61 102/62 112/62  Pulse: 68 72 77 78  Resp: _0 (!) 28  Temp:      TempSrc:      SpO2: 91% 94% 96% 96%  Weight:      Height:        Intake/Output Summary (Last 24 hours) at 12/10/2020 1101 Last data filed at 12/10/2020 8299 Gross per 24 hour  Intake 540 ml  Output 2800 ml  Net -2260 ml   Filed Weights   12/09/20 1350 12/10/20 0053 12/10/20 0600  Weight: 103.8 kg 104.7 kg 104 kg    Examination:  General exam: Appear weak ,alert, awake, communicative, NAD Respiratory system: Clear to auscultation. Respiratory effort normal. Cardiovascular system:  RRR.  Gastrointestinal system: Abdomen is nondistended, soft and nontender.  Normal bowel sounds heard. Central nervous system: Alert and oriented  to place and person, slightly confused about the time. No focal neurological deficits. Extremities:  no edema Skin: No rashes, lesions or  ulcers Psychiatry: Judgement and insight appear normal. Mood & affect appropriate.     Data Reviewed: I have personally reviewed following labs and imaging studies  CBC: Recent Labs  Lab 12/06/20 0411 12/07/20 0430 12/08/20 1055 12/09/20 0506 12/10/20 0455  WBC 11.2* 12.0* 12.1* 11.0* 8.3  HGB 10.8* 10.7* 11.0* 10.9* 10.7*  HCT 38.4* 36.8* 38.0* 38.1* 37.2*  MCV 93.0 91.8 92.7 92.0 93.9  PLT 171 169 129* 125* 109*    Basic Metabolic Panel: Recent Labs  Lab 12/04/20 0418 12/04/20 1745 12/05/20 0410 12/06/20 0411 12/07/20 0649 12/08/20 1055 12/09/20 0953  NA 134*   < > 131* 132* 128* 130* 132*  K 4.2   < > 4.5 4.3 4.6 4.4 5.0  CL 102   < > 100 97* 95* 96* 98  CO2 27   < > _1 GLUCOSE 115*   < > 157* 141* 160* 100* 120*  BUN 26*   < > 49* 38* 74* 59* 77*  CREATININE 1.81*   < > 2.86* 2.44* 3.80* 3.87* 5.01*  CALCIUM 8.7*   < > 8.6* 8.6* 8.5* 8.6* 8.7*  MG 2.6*  --  2.6* 2.2  --   --   --   PHOS 1.9*   < > 4.7* 4.5 5.3* 4.3 5.9*   < > = values in this interval not displayed.    GFR: Estimated Creatinine Clearance: 16.2 mL/min (A) (by C-G formula based on SCr of 5.01 mg/dL (H)).  Liver Function Tests: Recent Labs  Lab 12/05/20 0410 12/06/20 0411 12/07/20 0649 12/08/20 1055 12/09/20 0953  ALBUMIN 3.2* 3.3* 3.3* 3.2* 3.3*    CBG: Recent Labs  Lab 12/08/20 2100 12/09/20 0550 12/09/20 1555 12/09/20 2101 12/10/20 0558  GLUCAP 168* 119* 117* 132* 138*     No results found for this or any previous visit (from the past 240 hour(s)).       Radiology  Studies: No results found.      Scheduled Meds:  (feeding supplement) PROSource Plus  30 mL Oral TID BM   allopurinol  300 mg Oral Daily   atorvastatin  20 mg Oral Daily   B-complex with vitamin C  1 tablet Oral Daily   calcium acetate  667 mg Oral TID WC   Chlorhexidine Gluconate Cloth  6 each Topical Q0600   Chlorhexidine Gluconate Cloth  6 each Topical Q0600   darbepoetin  (ARANESP) injection - NON-DIALYSIS  100 mcg Subcutaneous Q Sun-1800   fentaNYL       heparin sodium (porcine)       insulin aspart  0-5 Units Subcutaneous QHS   insulin aspart  0-9 Units Subcutaneous TID WC   insulin glargine-yfgn  16 Units Subcutaneous QHS   lactulose  20 g Oral BID   mouth rinse  15 mL Mouth Rinse BID   metoprolol succinate  25 mg Oral Daily   midazolam       midodrine  15 mg Oral TID WC   pantoprazole  40 mg Oral Daily   polyethylene glycol  17 g Oral Daily   sildenafil  20 mg Oral TID   sodium chloride flush  10-40 mL Intracatheter Q12H   Zinc Oxide   Topical BID   Continuous Infusions:  sodium chloride Stopped (12/05/20 1754)   ceFAZolin      ceFAZolin (ANCEF) IV     heparin       LOS: 18 days   Time spent: 69mns Greater than 50% of this time was spent in counseling, explanation of diagnosis, planning of further management, and coordination of care.   Voice Recognition /Viviann Sparedictation system was used to create this note, attempts have been made to correct errors. Please contact the author with questions and/or clarifications.   FFlorencia Reasons MD PhD FACP [facility]  Available via Epic secure chat 7am-7pm for nonurgent issues Please page for urgent issues To page the attending provider between 7A-7P or the covering provider during after hours 7P-7A, please log into the web site [email or web address] and access using universal [place] password for that web site. If you do not have the password, please call the hospital operator.    12/10/2020, 11:01 AM

## 2020-12-10 NOTE — Progress Notes (Signed)
[facility] NEPHROLOGY PROGRESS NOTE  Assessment/ Plan:  # Acute kidney injury on chronic kidney disease stage IIIb: Likely hemodynamically mediated in the setting of acute exacerbation of congestive heart failure.  Started CRRT for fluid overload refractory to diuretics which was a stopped on 12/2 after optimization of volume.  First intermittent HD 12/3.  Remains anuric without sign of renal recovery. -Next HD tentatively planned for 12/10 (sat) if still here. IR consulted for [facility] placement-hopefully today - CLIP: [facility], can be started this coming Sat  #  Acute exacerbation of diastolic heart failure: With severe pulmonary hypertension and significant third spacing/volume overload.  Volume status improved with CRRT, now on IHD.  Cardiology is following.  Continue fluid restriction.  # Encephalopathy: Likely multifactorial with hypercapnic respiratory failure/CO2 narcosis and azotemia contributing to possible hepatic encephalopathy with slightly elevated ammonia levels.  Mental status improved..  #  Anemia of chronic illness: He has severe iron deficiency with an iron saturation of 4% and ferritin of 31.  Treated with IV iron and now on ESA.  Monitor hemoglobin and transfuse as needed. Hgb acceptable at 10.7 today  # Chronic atrial fibrillation: Developed A. fib with RVR, resolved. On beta blocker  #  Thrombocytopenia: Likely from splenic sequestration in the setting of cirrhosis, no evidence of bleeding diathesis.  Platelet count improved.  #CKD MBD:  Had hypophos due to CRRT.  Now phos is high, starting phoslo 12/8  #Hypotension: midodrine to 15 mg 3 times daily.  Blood pressure acceptable range today.  #Hyponatremia -managing with HD, 137Na bath  Subjective: Seen and examined this am. No acute events. Tolerated HD yesterday with net uf 2.8. open for tdc placement today Vital signs in last 24 hours: Vitals:   12/09/20 2017 12/10/20 0053 12/10/20 0354 12/10/20  0600  BP: 107/66  118/75   Pulse: 69  69   Resp: 18  17   Temp: 97.8 F (36.6 C)  97.6 F (36.4 C)   TempSrc: Oral  Oral   SpO2: 96%  100%   Weight:  104.7 kg  104 kg  Height:    5' 7"  (1.702 m)   Weight change: -1 kg  Intake/Output Summary (Last 24 hours) at 12/10/2020 0818 Last data filed at 12/10/2020 0444 Gross per 24 hour  Intake 480 ml  Output 2800 ml  Net -2320 ml       Labs: Basic Metabolic Panel: Recent Labs  Lab 12/07/20 0649 12/08/20 1055 12/09/20 0953  NA 128* 130* 132*  K 4.6 4.4 5.0  CL 95* 96* 98  CO2 24 25 24   GLUCOSE 160* 100* 120*  BUN 74* 59* 77*  CREATININE 3.80* 3.87* 5.01*  CALCIUM 8.5* 8.6* 8.7*  PHOS 5.3* 4.3 5.9*   Liver Function Tests: Recent Labs  Lab 12/07/20 0649 12/08/20 1055 12/09/20 0953  ALBUMIN 3.3* 3.2* 3.3*   No results for input(s): LIPASE, AMYLASE in the last 168 hours. No results for input(s): AMMONIA in the last 168 hours.  CBC: Recent Labs  Lab 12/06/20 0411 12/07/20 0430 12/08/20 1055 12/09/20 0506 12/10/20 0455  WBC 11.2* 12.0* 12.1* 11.0* 8.3  HGB 10.8* 10.7* 11.0* 10.9* 10.7*  HCT 38.4* 36.8* 38.0* 38.1* 37.2*  MCV 93.0 91.8 92.7 92.0 93.9  PLT 171 169 129* 125* 109*   Cardiac Enzymes: No results for input(s): CKTOTAL, CKMB, CKMBINDEX, TROPONINI in the last 168 hours. CBG: Recent Labs  Lab 12/08/20 2100 12/09/20 0550 12/09/20 1555 12/09/20 2101 12/10/20 0558  GLUCAP  168* 119* 117* 132* 138*    Iron Studies: No results for input(s): IRON, TIBC, TRANSFERRIN, FERRITIN in the last 72 hours. Studies/Results: No results found.  Medications: Infusions:  sodium chloride Stopped (12/05/20 1754)   heparin 2,100 Units/hr (12/10/20 0615)    Scheduled Medications:  (feeding supplement) PROSource Plus  30 mL Oral TID BM   allopurinol  300 mg Oral Daily   atorvastatin  20 mg Oral Daily   B-complex with vitamin C  1 tablet Oral Daily   Chlorhexidine Gluconate Cloth  6 each Topical Q0600    Chlorhexidine Gluconate Cloth  6 each Topical Q0600   darbepoetin (ARANESP) injection - NON-DIALYSIS  100 mcg Subcutaneous Q Sun-1800   insulin aspart  0-5 Units Subcutaneous QHS   insulin aspart  0-9 Units Subcutaneous TID WC   insulin glargine-yfgn  16 Units Subcutaneous QHS   lactulose  20 g Oral BID   mouth rinse  15 mL Mouth Rinse BID   metoprolol succinate  25 mg Oral Daily   midodrine  15 mg Oral TID WC   pantoprazole  40 mg Oral Daily   polyethylene glycol  17 g Oral Daily   sildenafil  20 mg Oral TID   sodium chloride flush  10-40 mL Intracatheter Q12H   Zinc Oxide   Topical BID    have reviewed scheduled and prn medications.  Physical Exam: General: NAD Heart:RRR, s1s2 nl Lungs: normal wob Abdomen:soft, Non-tender, non-distended Extremities: Wrapped with bandages on both legs Dialysis Access: temp HD catheter   William Flores 12/10/2020,8:18 AM  LOS: 18 days

## 2020-12-10 NOTE — Progress Notes (Signed)
ANTICOAGULATION CONSULT NOTE  Pharmacy Consult for warfarin > heparin  Indication: atrial fibrillation  Allergies  Allergen Reactions   Codeine     Headache     Patient Measurements: Height: 5' 7"  (170.2 cm) Weight: 104 kg (229 lb 4.5 oz) IBW/kg (Calculated) : 66.1 Heparin dosing wt: 90 kg  Vital Signs: Temp: 98.2 F (36.8 C) (12/08 2015) Temp Source: Oral (12/08 2015) BP: 109/67 (12/08 2015) Pulse Rate: 62 (12/08 2015)  Labs: Recent Labs    12/08/20 1055 12/09/20 0506 12/09/20 0953 12/09/20 2007 12/10/20 0455 12/10/20 2130  HGB 11.0* 10.9*  --   --  10.7*  --   HCT 38.0* 38.1*  --   --  37.2*  --   PLT 129* 125*  --   --  109*  --   HEPARINUNFRC 0.46 0.23*  --  0.31 0.23* 0.20*  CREATININE 3.87*  --  5.01*  --   --   --      Estimated Creatinine Clearance: 16.2 mL/min (A) (by C-G formula based on SCr of 5.01 mg/dL (H)).   Assessment: 89 yoM on warfarin PTA for hx AFib admitted for RV failure and AKI ultimately started on CRRT and IV heparin for anticoagulation. Patient recently admitted here in October for GI bleed and per GI recommended INR 2-2.5.  Heparin level 0.2 (on 2100 units/hr) No signs/symptoms of bleed  Goal of Therapy:  Heparin level 0.3-0.5 units/ml Monitor platelets by anticoagulation protocol: Yes   Plan:  Will increase heparin to 2250 units/hr Daily heparin level and CBC. Could not afford Eliquis in the past, but now copay is $9.85 with his Medicare plan. Discussed with Dr. Erlinda Hong this afternoon, will plan to continue IV heparin overnight and then start Eliquis and d/c IV heparin tomorrow AM.  Thank you for allowing pharmacy to be a part of this patient's care.  Donnald Garre, PharmD Clinical Pharmacist  Please check AMION for all [place] numbers After 10:00 PM, call [place] [phone]

## 2020-12-10 NOTE — Progress Notes (Signed)
Pt is for possible d/c tomorrow per MD. Covid test to be ordered today. Spoke to Cavalier at [facility] to make clinic aware pt for d/c tomorrow to snf and will start at clinic on Saturday. Will fax covid results to clinic once available. Met with pt at bedside to discuss out-pt HD arrangements. Information sheet provided to pt and will document arrangements on AVS as well. Provided details to CSW yesterday to inform snf.   [facility] Tuesday, Thursday, Saturday. Pt to start on Saturday. Arrive at 6:30 for 6:45 chair time.    Melven Sartorius Renal Navigator [phone]

## 2020-12-10 NOTE — Progress Notes (Signed)
[place] for warfarin > heparin while on CRRT Indication: atrial fibrillation  Allergies  Allergen Reactions   Codeine     Headache     Patient Measurements: Height: 5' 7"  (170.2 cm) Weight: 104.7 kg (230 lb 14.4 oz) IBW/kg (Calculated) : 66.1 Heparin dosing wt: 90 kg  Vital Signs: Temp: 97.6 F (36.4 C) (12/08 0354) Temp Source: Oral (12/08 0354) BP: 118/75 (12/08 0354) Pulse Rate: 69 (12/08 0354)  Labs: Recent Labs    12/07/20 0649 12/08/20 1055 12/08/20 1055 12/09/20 0506 12/09/20 0953 12/09/20 2007 12/10/20 0455  HGB  --  11.0*  --  10.9*  --   --   --   HCT  --  38.0*  --  38.1*  --   --   --   PLT  --  129*  --  125*  --   --   --   HEPARINUNFRC  --  0.46   < > 0.23*  --  0.31 0.23*  CREATININE 3.80* 3.87*  --   --  5.01*  --   --    < > = values in this interval not displayed.     Estimated Creatinine Clearance: 16.3 mL/min (A) (by C-G formula based on SCr of 5.01 mg/dL (H)).   Assessment: 27 yoM on warfarin PTA for hx AFib admitted for RV failure and AKI ultimately started on CRRT and IV heparin for anticoagulation. Patient recently admitted here in October for GI bleed and per GI recommended INR 2-2.5.  Heparin level down to subtherapeutic (0.23) on gtt at 1950 units/hr. No issues with line or bleeding reported per RN. RN is having to draw heparin level from same line where heparin running but she is pausing and flushing the line.  Goal of Therapy:  Heparin level 0.3-0.5 units/ml Monitor platelets by anticoagulation protocol: Yes   Plan:  Increase heparin to 2100 units/hr  F/u 8 hr heparin level  Thank you for allowing pharmacy to be a part of this patient's care.  Sherlon Handing, PharmD, BCPS Please see amion for complete clinical pharmacist phone list 12/10/2020 6:03 AM

## 2020-12-10 NOTE — Procedures (Signed)
Interventional Radiology Procedure Note  Procedure: Right IJ 19 cm Palindrome tunneled HD catheter placed.  Tips in the RA and ready for immediate use.   Complications: None  Estimated Blood Loss: None  Recommendations: - Routine line care   Signed,  Criselda Peaches, MD

## 2020-12-11 ENCOUNTER — Encounter (HOSPITAL_COMMUNITY): Payer: Self-pay

## 2020-12-11 DIAGNOSIS — E119 Type 2 diabetes mellitus without complications: Secondary | ICD-10-CM | POA: Diagnosis not present

## 2020-12-11 DIAGNOSIS — K7581 Nonalcoholic steatohepatitis (NASH): Secondary | ICD-10-CM | POA: Diagnosis not present

## 2020-12-11 DIAGNOSIS — K219 Gastro-esophageal reflux disease without esophagitis: Secondary | ICD-10-CM | POA: Diagnosis not present

## 2020-12-11 DIAGNOSIS — I5022 Chronic systolic (congestive) heart failure: Secondary | ICD-10-CM | POA: Diagnosis not present

## 2020-12-11 DIAGNOSIS — Z992 Dependence on renal dialysis: Secondary | ICD-10-CM | POA: Diagnosis not present

## 2020-12-11 DIAGNOSIS — I10 Essential (primary) hypertension: Secondary | ICD-10-CM | POA: Diagnosis not present

## 2020-12-11 DIAGNOSIS — E559 Vitamin D deficiency, unspecified: Secondary | ICD-10-CM | POA: Diagnosis not present

## 2020-12-11 DIAGNOSIS — K760 Fatty (change of) liver, not elsewhere classified: Secondary | ICD-10-CM | POA: Diagnosis not present

## 2020-12-11 DIAGNOSIS — K7460 Unspecified cirrhosis of liver: Secondary | ICD-10-CM | POA: Diagnosis not present

## 2020-12-11 DIAGNOSIS — E7800 Pure hypercholesterolemia, unspecified: Secondary | ICD-10-CM | POA: Diagnosis not present

## 2020-12-11 DIAGNOSIS — Z7189 Other specified counseling: Secondary | ICD-10-CM | POA: Diagnosis not present

## 2020-12-11 DIAGNOSIS — N186 End stage renal disease: Secondary | ICD-10-CM | POA: Diagnosis not present

## 2020-12-11 DIAGNOSIS — R29898 Other symptoms and signs involving the musculoskeletal system: Secondary | ICD-10-CM | POA: Diagnosis not present

## 2020-12-11 DIAGNOSIS — D631 Anemia in chronic kidney disease: Secondary | ICD-10-CM | POA: Diagnosis not present

## 2020-12-11 DIAGNOSIS — Z23 Encounter for immunization: Secondary | ICD-10-CM | POA: Diagnosis not present

## 2020-12-11 DIAGNOSIS — J9611 Chronic respiratory failure with hypoxia: Secondary | ICD-10-CM | POA: Diagnosis not present

## 2020-12-11 DIAGNOSIS — I509 Heart failure, unspecified: Secondary | ICD-10-CM | POA: Diagnosis not present

## 2020-12-11 DIAGNOSIS — I132 Hypertensive heart and chronic kidney disease with heart failure and with stage 5 chronic kidney disease, or end stage renal disease: Secondary | ICD-10-CM | POA: Diagnosis not present

## 2020-12-11 DIAGNOSIS — Z7901 Long term (current) use of anticoagulants: Secondary | ICD-10-CM | POA: Diagnosis not present

## 2020-12-11 DIAGNOSIS — N179 Acute kidney failure, unspecified: Secondary | ICD-10-CM | POA: Diagnosis not present

## 2020-12-11 DIAGNOSIS — E669 Obesity, unspecified: Secondary | ICD-10-CM | POA: Diagnosis not present

## 2020-12-11 DIAGNOSIS — I5032 Chronic diastolic (congestive) heart failure: Secondary | ICD-10-CM | POA: Diagnosis not present

## 2020-12-11 DIAGNOSIS — I4891 Unspecified atrial fibrillation: Secondary | ICD-10-CM | POA: Diagnosis not present

## 2020-12-11 DIAGNOSIS — E782 Mixed hyperlipidemia: Secondary | ICD-10-CM | POA: Diagnosis not present

## 2020-12-11 DIAGNOSIS — N390 Urinary tract infection, site not specified: Secondary | ICD-10-CM | POA: Diagnosis not present

## 2020-12-11 DIAGNOSIS — N189 Chronic kidney disease, unspecified: Secondary | ICD-10-CM | POA: Diagnosis not present

## 2020-12-11 DIAGNOSIS — I429 Cardiomyopathy, unspecified: Secondary | ICD-10-CM | POA: Diagnosis not present

## 2020-12-11 DIAGNOSIS — R310 Gross hematuria: Secondary | ICD-10-CM | POA: Diagnosis not present

## 2020-12-11 DIAGNOSIS — Z8669 Personal history of other diseases of the nervous system and sense organs: Secondary | ICD-10-CM | POA: Diagnosis not present

## 2020-12-11 DIAGNOSIS — M79642 Pain in left hand: Secondary | ICD-10-CM | POA: Diagnosis not present

## 2020-12-11 DIAGNOSIS — Z8249 Family history of ischemic heart disease and other diseases of the circulatory system: Secondary | ICD-10-CM | POA: Diagnosis not present

## 2020-12-11 DIAGNOSIS — E039 Hypothyroidism, unspecified: Secondary | ICD-10-CM | POA: Diagnosis not present

## 2020-12-11 DIAGNOSIS — Z6841 Body Mass Index (BMI) 40.0 and over, adult: Secondary | ICD-10-CM | POA: Diagnosis not present

## 2020-12-11 DIAGNOSIS — I5033 Acute on chronic diastolic (congestive) heart failure: Secondary | ICD-10-CM | POA: Diagnosis not present

## 2020-12-11 DIAGNOSIS — D509 Iron deficiency anemia, unspecified: Secondary | ICD-10-CM | POA: Diagnosis not present

## 2020-12-11 DIAGNOSIS — I482 Chronic atrial fibrillation, unspecified: Secondary | ICD-10-CM | POA: Diagnosis not present

## 2020-12-11 DIAGNOSIS — B9689 Other specified bacterial agents as the cause of diseases classified elsewhere: Secondary | ICD-10-CM | POA: Diagnosis not present

## 2020-12-11 DIAGNOSIS — I50813 Acute on chronic right heart failure: Secondary | ICD-10-CM | POA: Diagnosis not present

## 2020-12-11 DIAGNOSIS — I493 Ventricular premature depolarization: Secondary | ICD-10-CM | POA: Diagnosis not present

## 2020-12-11 DIAGNOSIS — R262 Difficulty in walking, not elsewhere classified: Secondary | ICD-10-CM | POA: Diagnosis not present

## 2020-12-11 DIAGNOSIS — G4733 Obstructive sleep apnea (adult) (pediatric): Secondary | ICD-10-CM | POA: Diagnosis not present

## 2020-12-11 DIAGNOSIS — I272 Pulmonary hypertension, unspecified: Secondary | ICD-10-CM | POA: Diagnosis not present

## 2020-12-11 DIAGNOSIS — N1832 Chronic kidney disease, stage 3b: Secondary | ICD-10-CM | POA: Diagnosis not present

## 2020-12-11 DIAGNOSIS — D518 Other vitamin B12 deficiency anemias: Secondary | ICD-10-CM | POA: Diagnosis not present

## 2020-12-11 DIAGNOSIS — Z7401 Bed confinement status: Secondary | ICD-10-CM | POA: Diagnosis not present

## 2020-12-11 DIAGNOSIS — D6959 Other secondary thrombocytopenia: Secondary | ICD-10-CM | POA: Diagnosis not present

## 2020-12-11 DIAGNOSIS — E1122 Type 2 diabetes mellitus with diabetic chronic kidney disease: Secondary | ICD-10-CM | POA: Diagnosis not present

## 2020-12-11 DIAGNOSIS — N481 Balanitis: Secondary | ICD-10-CM | POA: Diagnosis not present

## 2020-12-11 DIAGNOSIS — D696 Thrombocytopenia, unspecified: Secondary | ICD-10-CM | POA: Diagnosis not present

## 2020-12-11 DIAGNOSIS — D638 Anemia in other chronic diseases classified elsewhere: Secondary | ICD-10-CM | POA: Diagnosis not present

## 2020-12-11 DIAGNOSIS — E785 Hyperlipidemia, unspecified: Secondary | ICD-10-CM | POA: Diagnosis not present

## 2020-12-11 DIAGNOSIS — Z794 Long term (current) use of insulin: Secondary | ICD-10-CM | POA: Diagnosis not present

## 2020-12-11 DIAGNOSIS — I2729 Other secondary pulmonary hypertension: Secondary | ICD-10-CM | POA: Diagnosis not present

## 2020-12-11 DIAGNOSIS — I5042 Chronic combined systolic (congestive) and diastolic (congestive) heart failure: Secondary | ICD-10-CM | POA: Diagnosis not present

## 2020-12-11 DIAGNOSIS — D649 Anemia, unspecified: Secondary | ICD-10-CM | POA: Diagnosis not present

## 2020-12-11 DIAGNOSIS — M79641 Pain in right hand: Secondary | ICD-10-CM | POA: Diagnosis not present

## 2020-12-11 DIAGNOSIS — I130 Hypertensive heart and chronic kidney disease with heart failure and stage 1 through stage 4 chronic kidney disease, or unspecified chronic kidney disease: Secondary | ICD-10-CM | POA: Diagnosis not present

## 2020-12-11 DIAGNOSIS — I2781 Cor pulmonale (chronic): Secondary | ICD-10-CM | POA: Diagnosis not present

## 2020-12-11 DIAGNOSIS — Z79899 Other long term (current) drug therapy: Secondary | ICD-10-CM | POA: Diagnosis not present

## 2020-12-11 DIAGNOSIS — R319 Hematuria, unspecified: Secondary | ICD-10-CM | POA: Diagnosis not present

## 2020-12-11 DIAGNOSIS — I50812 Chronic right heart failure: Secondary | ICD-10-CM | POA: Diagnosis not present

## 2020-12-11 DIAGNOSIS — D472 Monoclonal gammopathy: Secondary | ICD-10-CM | POA: Diagnosis not present

## 2020-12-11 DIAGNOSIS — I2510 Atherosclerotic heart disease of native coronary artery without angina pectoris: Secondary | ICD-10-CM | POA: Diagnosis not present

## 2020-12-11 DIAGNOSIS — L89151 Pressure ulcer of sacral region, stage 1: Secondary | ICD-10-CM | POA: Diagnosis not present

## 2020-12-11 HISTORY — PX: IR US GUIDE VASC ACCESS RIGHT: IMG2390

## 2020-12-11 LAB — CBC
HCT: 37.1 % — ABNORMAL LOW (ref 39.0–52.0)
Hemoglobin: 10.7 g/dL — ABNORMAL LOW (ref 13.0–17.0)
MCH: 26.8 pg (ref 26.0–34.0)
MCHC: 28.8 g/dL — ABNORMAL LOW (ref 30.0–36.0)
MCV: 92.8 fL (ref 80.0–100.0)
Platelets: 116 10*3/uL — ABNORMAL LOW (ref 150–400)
RBC: 4 MIL/uL — ABNORMAL LOW (ref 4.22–5.81)
RDW: 24 % — ABNORMAL HIGH (ref 11.5–15.5)
WBC: 9 10*3/uL (ref 4.0–10.5)
nRBC: 0 % (ref 0.0–0.2)

## 2020-12-11 LAB — GLUCOSE, CAPILLARY
Glucose-Capillary: 121 mg/dL — ABNORMAL HIGH (ref 70–99)
Glucose-Capillary: 117 mg/dL — ABNORMAL HIGH (ref 70–99)
Glucose-Capillary: 124 mg/dL — ABNORMAL HIGH (ref 70–99)
Glucose-Capillary: 123 mg/dL — ABNORMAL HIGH (ref 70–99)

## 2020-12-11 LAB — SARS CORONAVIRUS 2 (TAT 6-24 HRS): SARS Coronavirus 2: NEGATIVE

## 2020-12-11 MED ORDER — INSULIN ASPART 100 UNIT/ML IJ SOLN: INTRAMUSCULAR | 0 refills | Status: DC

## 2020-12-11 MED ORDER — OXYCODONE HCL 5 MG PO TABS: 5.0000 mg | ORAL_TABLET | Freq: Four times a day (QID) | ORAL | 0 refills | Status: DC | PRN

## 2020-12-11 MED ORDER — CHLORHEXIDINE GLUCONATE CLOTH 2 % EX PADS
6.0000 | MEDICATED_PAD | Freq: Every day | CUTANEOUS | Status: DC
  Administered 2020-12-11: 6 via TOPICAL

## 2020-12-11 MED ORDER — ZINC OXIDE 12.8 % EX OINT: TOPICAL_OINTMENT | Freq: Two times a day (BID) | CUTANEOUS | 0 refills | Status: DC

## 2020-12-11 MED ORDER — LACTULOSE 10 GM/15ML PO SOLN: 20.0000 g | Freq: Two times a day (BID) | ORAL | 0 refills | Status: DC

## 2020-12-11 MED ORDER — APIXABAN 5 MG PO TABS: 5.0000 mg | ORAL_TABLET | Freq: Two times a day (BID) | ORAL | Status: DC

## 2020-12-11 MED ORDER — B COMPLEX-C PO TABS: 1.0000 | ORAL_TABLET | Freq: Every day | ORAL | Status: DC

## 2020-12-11 MED ORDER — MIDODRINE HCL 5 MG PO TABS: 15.0000 mg | ORAL_TABLET | Freq: Three times a day (TID) | ORAL | Status: DC

## 2020-12-11 MED ORDER — KIDNEY FAILURE BOOK
Freq: Once | Status: AC
  Filled 2020-12-11: qty 1

## 2020-12-11 NOTE — TOC Transition Note (Addendum)
Transition of Care [facility]) - CM/SW Discharge Note   Patient Details  Name: William Flores MRN: [id number] Date of Birth: 1950-04-24  [facility] CM/SW Contact:  Jameriah Trotti, LCSW Phone Number: 12/11/2020, 11:50 AM   Clinical Narrative:    Patient will DC to: [facility] Anticipated DC date: 12/11/20 Family notified: Yes, [name] Transport by: [facility]   Per MD patient ready for DC to [facility]. RN to call report prior to discharge [phone] Room# 414B). RN, patient, patient's family, and facility notified of DC. Discharge Summary and FL2 sent to facility. DC packet on chart. Ambulance transport requested for patient with [facility] at 7:30pm.  CSW will sign off for now as social work intervention is no longer needed. Please consult [place] again if new needs arise.     Final next level of care: [place] Barriers to Discharge: No Barriers Identified   Patient Goals and CMS Choice Patient states their goals for this hospitalization and ongoing recovery are:: go to [facility] for rehab [name] Compare Post Acute Care list provided to:: Patient Choice offered to / list presented to : Patient  Discharge Placement PASRR number recieved: 12/11/20 Existing PASRR number confirmed : 12/11/20          Patient chooses bed at: [name] Patient to be transferred to facility by: [facility] or [place] Name of family member notified: [name] [phone] Patient and family notified of of transfer: 12/11/20  Discharge Plan and Services In-house Referral: Clinical Social Work Discharge Planning Services: [employer] Consult                                 Social Determinants of Health (SDOH) Interventions Food Insecurity Interventions: Intervention Not Indicated Financial Strain Interventions: Intervention Not Indicated Housing Interventions: Intervention Not Indicated, Other (Comment) (Patient is from SNF, [facility]) Transportation Interventions: Intervention Not Indicated (Pt reports the SNF has been getting him to appointments)   Readmission Risk Interventions Readmission Risk Prevention Plan 11/23/2020 10/24/2020 10/15/2020  Transportation Screening Complete Complete Complete  PCP or Specialist Appt within 3-5 Days - - -  [place] or [place] - - Complete  Social Work Consult for [place] Planning/Counseling - - Complete  Palliative Care Screening - - Not Applicable  Medication Review Press photographer) Complete Complete Complete  PCP or Specialist appointment within 3-5 days of discharge Complete (No Data) -  [place] or Home Care Consult Complete Complete -  SW Recovery Care/Counseling Consult Complete Complete -  Palliative Care Screening - Not Applicable -  [place] Complete Complete -  Some recent data might be hidden     Tirsa Gail, MSW, [place] Heart Failure Social Worker

## 2020-12-11 NOTE — Progress Notes (Signed)
PT Cancellation Note  Patient Details Name: William Flores MRN: [id number] DOB: 05/22/1950   Cancelled Treatment:    Reason Eval/Treat Not Completed: Patient declined, no reason specified. Pt reporting he just returned to supine from walking to the bathroom with nursing and sitting EOB to eat lunch, politely declining to get OOB or perform further exercises at this time. Will follow-up as time permits.  Moishe Spice, PT, DPT Acute Rehabilitation Services  Pager: [phone] Office: [place] 12/11/2020, 12:26 PM

## 2020-12-11 NOTE — TOC Progression Note (Addendum)
Transition of Care [facility]) - Progression Note    Patient Details  Name: William Flores MRN: [id number] Date of Birth: 23-Nov-1950  Transition of Care Operating Room Services) CM/SW [place], [place] Phone Number: 12/11/2020, 9:41 AM  Clinical Narrative:    HF CSW spoke with the patient's daughter, [name] [phone] to let her know about the plan for discharge today.   CSW spoke with [name] at [place] [phone] to let her know that Mr. Misko will be discharging today. [name] reported her plan to come to the hospital today to get him to sign paperwork for Medicaid but can see him at [facility] next week if needed.  10:20am - HF CSW spoke with Mr. Compston at bedside to discuss the plan for discharge today to [facility] and Mr. Mario is agreeable and okay with the CSW arranging transportation to get him to the facility.  11:27am - HF CSW received a call from Tadeo Besecker the patients son and updated about the discharge plan and Derek Mound had no further questions at this time.  CSW will continue to follow throughout discharge.   Expected Discharge Plan: [place] Barriers to Discharge: No Barriers Identified  Expected Discharge Plan and Services Expected Discharge Plan: [place] In-house Referral: Clinical Social Work Discharge Planning Services: CM Consult   Living arrangements for the past 2 months: [place] Expected Discharge Date: 12/11/20                                     Social Determinants of Health (SDOH) Interventions Food Insecurity Interventions: Intervention Not Indicated Financial Strain Interventions: Intervention Not Indicated Housing Interventions: Intervention Not Indicated, Other (Comment) (Patient is from SNF, [facility]) Transportation Interventions: Intervention Not Indicated (Pt reports the SNF has been getting him to appointments)  Readmission Risk  Interventions Readmission Risk Prevention Plan 11/23/2020 10/24/2020 10/15/2020  Transportation Screening Complete Complete Complete  PCP or Specialist Appt within 3-5 Days - - -  HRI or [place] - - Complete  Social Work Consult for [place] Planning/Counseling - - Complete  Palliative Care Screening - - Not Applicable  Medication Review Press photographer) Complete Complete Complete  PCP or Specialist appointment within 3-5 days of discharge Complete (No Data) -  [place] or Home Care Consult Complete Complete -  SW Recovery Care/Counseling Consult Complete Complete -  Palliative Care Screening - Not Applicable -  [place] Complete Complete -  Some recent data might be hidden   Ellis Mehaffey, MSW, LCSWA [phone] Heart Failure Social Worker

## 2020-12-11 NOTE — Progress Notes (Signed)
Rounded on patient today in correlation to transition to outpatient HD secondary to AKI. Patient given Kidney Failure book. Patient educated at the bedside regarding care of tunneled dialysis catheter and proper medication administration on HD days.  Patient with questions as to whether they will have his medication list at [name]. Patient educated that this will be the case but to play strict attention to his blood pressure medication as dialysis can lower his pressure.  Patient also educated on the importance of adhering to scheduled dialysis treatments, the effects of fluid overload, hyperkalemia and hyperphosphatemia. Patient capable of re-verbalizing via teach back method. Patient with no further questions at this time. Handouts and contact information provided to patient for any further assistance. Patient plan to d/c today with first outpatient HD tomorrow.   Dorthey Sawyer, RN  Dialysis Nurse Coordinator Phone: [phone]

## 2020-12-11 NOTE — Discharge Summary (Addendum)
Discharge Summary  William Flores IBB:[id number] DOB: 04-08-1950  PCP: [name], [name], MD  Admit date: 11/22/2020 Discharge date: 12/11/2020  Time spent: 36mns, more than 50% time spent on coordination of care. Daughter updated over the phone.  Recommendations for Outpatient Follow-up:  F/u with SNF MD within a week  for hospital discharge follow up, repeat cbc/bmp at follow up Start outpatient dialysis Tuesday Thursday Saturday  F/u with cardiology heart failure clinic on 12/14 F/u with hematology /oncology for MGUS Continue wound care, unna boot bilateral lower extremity , change twice a week ( Mon Thu)  Discharge Diagnoses:  Active Hospital Problems   Diagnosis Date Noted   Acute exacerbation of CHF (congestive heart failure) ([facility] 11/22/2020   Pressure injury of skin 11/25/2020   Elevated brain natriuretic peptide (BNP) level 11/22/2020   Thrombocytopenia (HCC) 11/22/2020   GERD (gastroesophageal reflux disease) 11/22/2020   Insomnia 11/22/2020   Liver cirrhosis secondary to NASH ([facility] 10/14/2020   Pulmonary hypertension ([facility] 12/28/2018   Morbid obesity with BMI of 45.0-49.9, adult ([facility] 05/18/2017   Mixed hyperlipidemia 05/18/2017   CKD (chronic kidney disease), stage III ([facility] 09/17/2015   Essential hypertension    Chronic respiratory failure with hypoxia ([facility]    DM (diabetes mellitus), type 2 with renal complications ([facility] 045/03/8880  Atrial fibrillation with RVR ([facility] 04/14/2014   Anticoagulated on Coumadin 03/20/2014   OSA (obstructive sleep apnea)    Right heart failure ([facility] 02/10/2014    Resolved Hospital Problems  No resolved problems to display.    Discharge Condition: stable  Diet recommendation: heart healthy/carb modified/renal diet  Filed Weights   12/09/20 1350 12/10/20 0053 12/10/20 0600  Weight: 103.8 kg 104.7 kg 104 kg    History of present illness: ( per admitting MD Dr AJosephine Cables Chief Complaint: Groin swelling   HPI: RNIYAM BISPINGis a 70y.o. male with medical history significant for chronic atrial fibrillation on warfarin, CKD stage IIIb, type II DM, hypertension, hyperlipidemia, GERD, chronic HFpEF (LVEF on 10/16/2020 was 55-60%) and hepatic cirrhosis who presents to the emergency department for evaluation of increased leg swelling and abdominal swelling that has been ongoing for several weeks.  Patient states that he followed up with his cardiologist (Dr. MAundra Dubin about 2 days ago due to the increased swelling and his diuretic was increased.  He complained of significant testicular swelling since his visit to the cardiologist about 2 days ago, but he denies abdominal or testicular pain, fever, nausea, diarrhea, worsening shortness of breath. Patient was recently admitted from 10/12-10/22 due to GI bleed/hematochezia and acute blood loss anemia   ED Course:  In the emergency department, he was tachypneic and intermittently tachycardic.  O2 sat was 94-97% on supplemental oxygen via [place] at 4 LPM.  Work-up in the ED showed hyperglycemia, BUN/creatinine 58/2.08, albumin 3.4, normocytic anemia, thrombocytopenia.  BNP 270. Chest x-ray showed cardiomegaly with mild perihilar vascular congestion and basilar interstitial edema with small pleural effusions. IV Lasix 80 mg x 1 was given.  Hospitalist was asked to admit patient for further evaluation and management.    Hospital Course:  Principal Problem:   Acute exacerbation of CHF (congestive heart failure) (HCC) Active Problems:   Right heart failure (HCC)   OSA (obstructive sleep apnea)   Anticoagulated on Coumadin   Atrial fibrillation with RVR (HCC)   DM (diabetes mellitus), type 2 with renal complications (HCC)   Chronic respiratory failure with hypoxia (HCC)   Essential hypertension   CKD (chronic  kidney disease), stage III ([place])   Mixed hyperlipidemia   Morbid obesity with BMI of 45.0-49.9, adult ([place])   Pulmonary hypertension (HCC)   Liver cirrhosis secondary to  NASH (HCC)   Elevated brain natriuretic peptide (BNP) level   Thrombocytopenia (HCC)   GERD (gastroesophageal reflux disease)   Insomnia   Pressure injury of skin   Cor pulmonale/RV failure:  chronic hypoxic respiratory failure on home 02 -cards: Suspects primarily due to OHS/OSA.  -Echo in 4/21 with EF 55-60%, RV severely dilated w/ severe RV dysfunction in the setting of severe pulmonary hypertension - Continue HD TTS per nephrology  - Continues midodrine 15 mg tid for BP support    Pulmonary HTN:  -per cHF team - Continue sildenafil 20 mg tid.  - Continue home oxygen and CPAP at night.      AKI on Stage IIIb CKD now ESRD:  -Anuric and now HD dependent.  - HD per nephrology - tunneled HD cath placed on 12/8   Atrial fibrillation: Chronic, unlikely to be able to successfully cardiovert given long-standing atrial fibrillation.   - was on po amiodarone for rate control for with soft BP, cardiology started  back low dose Toprol XL.  - was on heparin gtt for tunneled cath placement,  transition to eliquis per cards recommendation ( detail please see cards note from 12/6)  F/u with cardiology    Insulin dependent Diabetes: type 2 Adjust insulin as needed    MGUS: Previously followed by hem/onc but has not seen in several years.  Multiple myeloma panel 4/21 with presence of M-spike.   Cirrhosis: ?Due to NAFLD.  hepatic encephalopathy?   11/23 Ammonia 48. Paracentesis 2.4 L this admission. Mental status improved.  - Continue lactulose.  -slightly confused about the time    Anemia of chronic disease : Hgb stable. No obvious source of bleeding.  -s/p IV Fe.     Right Hand pain-possible pseudogout gout? -? Source of increasing WBC count as this was treated with prednisone -right hand x ray no acute findings,  Pain control, continue allopurinol -wbc normalized -consider hand specialist referral if right hand pain persists   Obesity/osa on cpap Body mass index is 36.67 kg/m.      Nutritional Assessment:   The patient's BMI is: Body mass index is 35.91 kg/m.Marland Kitchen   Seen by dietician.  I agree with the assessment and plan as outlined below:   Nutrition Status: Nutrition Problem: Increased nutrient needs Etiology: acute illness Signs/Symptoms: estimated needs Interventions: MVI, Prostat   . Pressure Ulcer 02/17/14 Stage II -  Partial thickness loss of dermis presenting as a shallow open ulcer with a red, pink wound bed without slough. (Active)  02/17/14 2030  Location: Sacrum  Location Orientation: Bilateral  Staging: Stage II -  Partial thickness loss of dermis presenting as a shallow open ulcer with a red, pink wound bed without slough.  Wound Description (Comments):   Present on Admission: Yes (present on admission to [facility])     Pressure Injury 11/24/20 Coccyx Lower;Mid Stage 1 -  Intact skin with non-blanchable redness of a localized area usually over a bony prominence. SKIN INTACT BUT BOGGY (Active)  11/24/20 2202  Location: Coccyx  Location Orientation: Lower;Mid  Staging: Stage 1 -  Intact skin with non-blanchable redness of a localized area usually over a bony prominence.  Wound Description (Comments): SKIN INTACT BUT BOGGY  Present on Admission: Yes      Procedures: Started on dialysis Picc line placement and  removal   Consultations: Critical care Nephrology IR Cardiology/heart failure team Palliative care  Discharge Exam: BP 116/64 (BP Location: Left Arm)   Pulse 64   Temp 98 F (36.7 C) (Oral)   Resp 16   Ht 5' 7"  (1.702 m)   Wt 104 kg   SpO2 97%   BMI 35.91 kg/m   General: NAD, slightly confused about the year Cardiovascular: RRR Respiratory: normal respiratory effort   Discharge Instructions You were cared for by a hospitalist during your hospital stay. If you have any questions about your discharge medications or the care you received while you were in the hospital after you are discharged, you can call the unit and  asked to speak with the hospitalist on call if the hospitalist that took care of you is not available. Once you are discharged, your primary care physician will handle any further medical issues. Please note that NO REFILLS for any discharge medications will be authorized once you are discharged, as it is imperative that you return to your primary care physician (or establish a relationship with a primary care physician if you do not have one) for your aftercare needs so that they can reassess your need for medications and monitor your lab values.  Discharge Instructions     Diet general   Complete by: As directed    Renal diet, carb modified   Discharge wound care:   Complete by: As directed    Pressure offloading measures   Increase activity slowly   Complete by: As directed       Allergies as of 12/11/2020       Reactions   Codeine    Headache        Medication List     STOP taking these medications    insulin degludec 100 UNIT/ML FlexTouch Pen Commonly known as: TRESIBA   Klor-Con M20 20 MEQ tablet Generic drug: potassium chloride SA   LANTUS SOLOSTAR [place]   Steglatro 5 MG Tabs tablet Generic drug: ertugliflozin L-PyroglutamicAc   torsemide 20 MG tablet Commonly known as: DEMADEX   Trulicity 1.5 DD/2.2GU Sopn Generic drug: Dulaglutide   warfarin 7.5 MG tablet Commonly known as: COUMADIN       TAKE these medications    acetaminophen 325 MG tablet Commonly known as: TYLENOL Take 2 tablets (650 mg total) by mouth every 6 (six) hours as needed for mild pain (or Fever >/= 101).   allopurinol 300 MG tablet Commonly known as: ZYLOPRIM Take 1 tablet (300 mg total) by mouth daily.   apixaban 5 MG Tabs tablet Commonly known as: ELIQUIS Take 1 tablet (5 mg total) by mouth 2 (two) times daily.   atorvastatin 20 MG tablet Commonly known as: LIPITOR TAKE 1 TABLET BY MOUTH EVERY DAY   B-complex with vitamin C tablet Take 1 tablet by mouth daily. Start taking  on: December 12, 2020   benzonatate 100 MG capsule Commonly known as: TESSALON Take 1 capsule (100 mg total) by mouth 2 (two) times daily as needed for cough.   Contour Next Test test strip Generic drug: glucose blood Test BS TID Dx E11.29   ferrous sulfate 325 (65 FE) MG tablet Take 325 mg by mouth daily with breakfast.   insulin aspart 100 UNIT/ML injection Commonly known as: novoLOG Before each meal 3 times a day, 140-199 - 2 units, 200-250 - 4 units, 251-299 - 6 units,  300-349 - 8 units,  350 or above 10 units. Insulin PEN if approved, provide  syringes and needles if needed.   insulin glargine-yfgn 100 UNIT/ML Pen Commonly known as: SEMGLEE Inject 16 Units into the skin at bedtime.   Insulin Pen Needle 32G X 4 MM Misc Use to give insulin daily Dx E11.29   lactulose 10 GM/15ML solution Commonly known as: CHRONULAC Take 30 mLs (20 g total) by mouth 2 (two) times daily.   melatonin 5 MG Tabs Take 5 mg by mouth at bedtime as needed.   metoprolol succinate 25 MG 24 hr tablet Commonly known as: TOPROL-XL Take 1 tablet (25 mg total) by mouth daily. What changed: Another medication with the same name was removed. Continue taking this medication, and follow the directions you see here.   midodrine 5 MG tablet Commonly known as: PROAMATINE Take 3 tablets (15 mg total) by mouth 3 (three) times daily with meals.   oxyCODONE 5 MG immediate release tablet Commonly known as: Oxy IR/ROXICODONE Take 1 tablet (5 mg total) by mouth every 6 (six) hours as needed for moderate pain or severe pain.   OXYGEN Inhale into the lungs. Continuous   pantoprazole 40 MG tablet Commonly known as: PROTONIX TAKE 1 TABLET BY MOUTH EVERY DAY   polyethylene glycol 17 g packet Commonly known as: MIRALAX / GLYCOLAX Take 17 g by mouth daily.   sildenafil 20 MG tablet Commonly known as: REVATIO Take 1 tablet (20 mg total) by mouth 3 (three) times daily.   Zinc Oxide 12.8 % ointment Commonly  known as: TRIPLE PASTE Apply topically 2 (two) times daily.               Discharge Care Instructions  (From admission, onward)           Start     Ordered   12/11/20 0000  Discharge wound care:       Comments: Pressure offloading measures   12/11/20 0842           Allergies  Allergen Reactions   Codeine     Headache     Contact information for follow-up providers     [facility] Follow up.   Why: Schedule is Tuesday/Thursday/Saturday Patient to start on Saturday Arrive at 6:30 for 6:45 chair time. Contact information: [place] [address] [phone]              Contact information for after-discharge care     Destination     [facility] .   Service: Skilled Nursing Contact information: [place] [phone]                      The results of significant diagnostics from this hospitalization (including imaging, microbiology, ancillary and laboratory) are listed below for reference.    Significant Diagnostic Studies: US Paracentesis  Result Date: 11/24/2020 INDICATION: Ascites, cirrhosis due to NASH, history colon cancer EXAM: ULTRASOUND GUIDED DIAGNOSTIC AND THERAPEUTIC PARACENTESIS MEDICATIONS: None. COMPLICATIONS: None immediate. PROCEDURE: Informed written consent was obtained from the patient after a discussion of the risks, benefits and alternatives to treatment. A timeout was performed prior to the initiation of the procedure. Initial ultrasound scanning demonstrates a moderate amount of ascites within the right lower abdominal quadrant. The right lower abdomen was prepped and draped in the usual sterile fashion. 1% lidocaine was used for local anesthesia. Following this, a 5 [place] Yueh catheter was introduced. An ultrasound image was saved for documentation purposes. The paracentesis was performed.  The catheter was removed and a  dressing was applied. The patient tolerated the procedure well without immediate post procedural complication. FINDINGS: A total of approximately 2.4 L of yellow ascitic fluid was removed. Samples were sent to the laboratory as requested by the clinical team. IMPRESSION: Successful ultrasound-guided paracentesis yielding 2.4 liters of peritoneal fluid. Electronically Signed   By: Lavonia Dana M.D.   On: 11/24/2020 14:57   DG CHEST PORT 1 VIEW  Result Date: 11/27/2020 CLINICAL DATA:  Central line placement. EXAM: PORTABLE CHEST 1 VIEW COMPARISON:  Same day. FINDINGS: Interval placement right internal jugular catheter with distal tip in expected position of the SVC. No pneumothorax is noted. IMPRESSION: Interval placement of right internal jugular catheter with distal tip in expected position of the SVC. No pneumothorax is noted. Electronically Signed   By: Marijo Conception M.D.   On: 11/27/2020 13:51   DG CHEST PORT 1 VIEW  Result Date: 11/27/2020 CLINICAL DATA:  Dyspnea. Cough and congestion and shortness of breath. EXAM: PORTABLE CHEST 1 VIEW COMPARISON:  11/25/2020 and older studies. FINDINGS: Cardiac silhouette is borderline enlarged. No mediastinal or hilar masses. There is hazy opacity throughout right hemithorax most prominent at the base, consistent with a combination of layering pleural fluid and airspace lung opacity. This has increased from the prior exam. There is also opacity at the left lung base, not well defined, consistent with atelectasis with a probable small effusion. Mid to upper left lung is clear. No pneumothorax. Right-sided PICC is stable. IMPRESSION: 1. Interval worsening of lung aeration on the right, although the apparent change may be due to differences in patient positioning and technique. Right-sided opacity is consistent with a combination of layering right pleural fluid and parenchymal lung opacity, the latter finding which may reflect asymmetric edema or pneumonia. 2. Mild  stable opacity at the left lung base consistent with atelectasis with a suspected small effusion. Electronically Signed   By: Lajean Manes M.D.   On: 11/27/2020 10:24   DG CHEST PORT 1 VIEW  Result Date: 11/25/2020 CLINICAL DATA:  PICC line placement EXAM: PORTABLE CHEST 1 VIEW COMPARISON:  11/22/2020 FINDINGS: Right upper extremity central venous catheter tip projects over SVC origin. Cardiomegaly with vascular congestion, pulmonary edema and pleural effusions without significant change. Aortic atherosclerosis. IMPRESSION: 1. Right upper extremity central venous catheter tip overlies the SVC origin 2. Cardiomegaly with continued vascular congestion, edema and pleural effusions. Electronically Signed   By: Donavan Foil M.D.   On: 11/25/2020 22:30   DG Chest Port 1 View  Result Date: 11/22/2020 CLINICAL DATA:  swelling and shortness of breath. EXAM: PORTABLE CHEST 1 VIEW COMPARISON:  Portable chest 10/15/2020. FINDINGS: The heart is enlarged. Mild perihilar vascular congestion continues to be seen with mild lung base interstitial edema and small pleural effusions. No focal pneumonia is seen. Similar findings were noted previously. Osteopenia. IMPRESSION: Cardiomegaly with mild perihilar vascular congestion and basilar interstitial edema with small pleural effusions. The overall aeration seems unchanged. Electronically Signed   By: Telford Nab M.D.   On: 11/22/2020 04:01   DG Hand Complete Right  Result Date: 12/10/2020 CLINICAL DATA:  Tenderness EXAM: RIGHT HAND - COMPLETE 3+ VIEW COMPARISON:  None. FINDINGS: Diffuse osteopenia. Monitoring lead overlies the distal index finger. Extensive arterial calcifications. No fracture or dislocation. No radiodense foreign body. No subcutaneous gas. IMPRESSION: Osteopenia.  No acute findings. Electronically Signed   By: Lucrezia Europe M.D.   On: 12/10/2020 12:16   ECHOCARDIOGRAM COMPLETE  Result Date: 11/22/2020    ECHOCARDIOGRAM REPORT   Patient Name:    ELIEL DUDDING Date of Exam: 11/22/2020 Medical Rec #:  [id number]      Height:       64.0 in Accession #:    [id number]     Weight:       287.0 lb Date of Birth:  [birth year]      BSA:          2.281 m Patient Age:    69 years       BP:           136/88 mmHg Patient Gender: M              HR:           61 bpm. Exam Location:  [facility] Procedure: 2D Echo, Cardiac Doppler, Color Doppler and Intracardiac            Opacification Agent Indications:    I50.40* Unspecified combined systolic (congestive) and diastolic                 (congestive) heart failure  History:        Patient has prior history of Echocardiogram examinations, most                 recent 10/06/2020. CHF and Cardiomyopathy, Abnormal ECG,                 Pulmonary HTN, Arrythmias:Atrial Fibrillation; Risk                 Factors:Diabetes, Hypertension and Dyslipidemia. RV failure.  Sonographer:    Roseanna Rainbow RDCS Referring Phys: [id number] OLADAPO ADEFESO  Sonographer Comments: Technically difficult study due to poor echo windows and patient is morbidly obese. Patient moving, grunting and coughing throughout exam. Patient having back pain. Study interrupted by consult. IMPRESSIONS  1. Left ventricular ejection fraction, by estimation, is 55 to 60%. The left ventricle has normal function. The left ventricle has no regional wall motion abnormalities. The left ventricular internal cavity size was moderately dilated. There is mild left ventricular hypertrophy. Left ventricular diastolic parameters are indeterminate. There is the interventricular septum is flattened in systole and diastole, consistent with right ventricular pressure and volume overload.  2. Right ventricular systolic function is severely reduced. The right ventricular size is severely enlarged. There is severely elevated pulmonary artery systolic pressure.  3. Left atrial size was moderately dilated.  4. Right atrial size was severely dilated.  5. The mitral valve is abnormal. Mild to moderate  mitral valve regurgitation. No evidence of mitral stenosis. Moderate mitral annular calcification.  6. Tricuspid valve regurgitation is moderate.  7. The aortic valve is calcified. Aortic valve regurgitation is not visualized. No aortic stenosis is present.  8. The inferior vena cava is dilated in size with <50% respiratory variability, suggesting right atrial pressure of 15 mmHg. FINDINGS  Left Ventricle: Left ventricular ejection fraction, by estimation, is 55 to 60%. The left ventricle has normal function. The left ventricle has no regional wall motion abnormalities. Definity contrast agent was given IV to delineate the left ventricular  endocardial borders. The left ventricular internal cavity size was moderately dilated. There is mild left ventricular hypertrophy. The interventricular septum is flattened in systole and diastole, consistent with right ventricular pressure and volume overload. Left ventricular diastolic parameters are indeterminate. Right Ventricle: The right ventricular size is severely enlarged. Right ventricular systolic function is severely reduced. There is severely elevated pulmonary  artery systolic pressure. The tricuspid regurgitant velocity is 4.02 m/s, and with an assumed right atrial pressure of 15 mmHg, the estimated right ventricular systolic pressure is 63.8 mmHg. Left Atrium: Left atrial size was moderately dilated. Right Atrium: Right atrial size was severely dilated. Pericardium: Trivial pericardial effusion is present. Mitral Valve: The mitral valve is abnormal. Moderate mitral annular calcification. Mild to moderate mitral valve regurgitation. No evidence of mitral valve stenosis. Tricuspid Valve: The tricuspid valve is normal in structure. Tricuspid valve regurgitation is moderate . No evidence of tricuspid stenosis. Aortic Valve: The aortic valve is calcified. Aortic valve regurgitation is not visualized. No aortic stenosis is present. Pulmonic Valve: The pulmonic valve was  normal in structure. Pulmonic valve regurgitation is not visualized. No evidence of pulmonic stenosis. Aorta: The aortic root is normal in size and structure. Venous: The inferior vena cava is dilated in size with less than 50% respiratory variability, suggesting right atrial pressure of 15 mmHg. IAS/Shunts: No atrial level shunt detected by color flow Doppler.  MR Peak grad:   104.4 mmHg   TRICUSPID VALVE MR Mean grad:   70.0 mmHg    TR Peak grad:   64.6 mmHg MR Vmax:        511.00 cm/s  TR Vmax:        402.00 cm/s MR Vmean:       394.0 cm/s MR PISA:        0.25 cm MR PISA Radius: 0.20 cm Kirk Ruths MD Electronically signed by Kirk Ruths MD Signature Date/Time: 11/22/2020/11:35:35 AM    Final    [place] ASCITES (ABDOMEN LIMITED)  Result Date: 11/22/2020 CLINICAL DATA:  Surgeon for ascites. EXAM: LIMITED ABDOMEN ULTRASOUND FOR ASCITES TECHNIQUE: Limited ultrasound survey for ascites was performed in all four abdominal quadrants. COMPARISON:  None. FINDINGS: There is mild free fluid in the right upper quadrant. There is moderate free fluid in the right lower quadrant, left lower quadrant and left upper quadrant. IMPRESSION: Mild to moderate 4 quadrant ascites. Electronically Signed   By: Audie Pinto M.D.   On: 11/22/2020 10:12   [place] EKG SITE RITE  Result Date: 11/25/2020 If Site Rite image not attached, placement could not be confirmed due to current cardiac rhythm.   Microbiology: Recent Results (from the past 240 hour(s))  SARS CORONAVIRUS 2 (TAT 6-24 HRS) Nasopharyngeal Nasopharyngeal Swab     Status: None   Collection Time: 12/10/20  1:32 PM   Specimen: Nasopharyngeal Swab  Result Value Ref Range Status   SARS Coronavirus 2 NEGATIVE NEGATIVE Final    Comment: (NOTE) SARS-CoV-2 target nucleic acids are NOT DETECTED.  The SARS-CoV-2 RNA is generally detectable in upper and lower respiratory specimens during the acute phase of infection. Negative results do not preclude SARS-CoV-2  infection, do not rule out co-infections with other pathogens, and should not be used as the sole basis for treatment or other patient management decisions. Negative results must be combined with clinical observations, patient history, and epidemiological information. The expected result is Negative.  Fact Sheet for Patients: SugarRoll.be  Fact Sheet for Healthcare Providers: https://www.woods-mathews.com/  This test is not yet approved or cleared by the [place] FDA and  has been authorized for detection and/or diagnosis of SARS-CoV-2 by FDA under an Emergency Use Authorization (EUA). This EUA will remain  in effect (meaning this test can be used) for the duration of the COVID-19 declaration under Se ction 564(b)(1) of the Act, 21 U.S.C. section 360bbb-3(b)(1), unless the authorization is terminated  or revoked sooner.  Performed at [facility], [place] [address], [place], [place] [address]      Labs: Basic Metabolic Panel: Recent Labs  Lab 12/05/20 0410 12/06/20 0411 12/07/20 0649 12/08/20 1055 12/09/20 0953  NA 131* 132* 128* 130* 132*  K 4.5 4.3 4.6 4.4 5.0  CL 100 97* 95* 96* 98  CO2 25 27 24 25 24   GLUCOSE 157* 141* 160* 100* 120*  BUN 49* 38* 74* 59* 77*  CREATININE 2.86* 2.44* 3.80* 3.87* 5.01*  CALCIUM 8.6* 8.6* 8.5* 8.6* 8.7*  MG 2.6* 2.2  --   --   --   PHOS 4.7* 4.5 5.3* 4.3 5.9*   Liver Function Tests: Recent Labs  Lab 12/05/20 0410 12/06/20 0411 12/07/20 0649 12/08/20 1055 12/09/20 0953  ALBUMIN 3.2* 3.3* 3.3* 3.2* 3.3*   No results for input(s): LIPASE, AMYLASE in the last 168 hours. No results for input(s): AMMONIA in the last 168 hours. CBC: Recent Labs  Lab 12/07/20 0430 12/08/20 1055 12/09/20 0506 12/10/20 0455 12/11/20 0520  WBC 12.0* 12.1* 11.0* 8.3 9.0  HGB 10.7* 11.0* 10.9* 10.7* 10.7*  HCT 36.8* 38.0* 38.1* 37.2* 37.1*  MCV 91.8 92.7 92.0 93.9 92.8  PLT 169 129* 125* 109* 116*    Cardiac Enzymes: No results for input(s): CKTOTAL, CKMB, CKMBINDEX, TROPONINI in the last 168 hours. BNP: BNP (last 3 results) Recent Labs    11/20/20 1128 11/22/20 0425 12/01/20 0330  BNP 306.1* 270.0* 207.3*    ProBNP (last 3 results) No results for input(s): PROBNP in the last 8760 hours.  CBG: Recent Labs  Lab 12/10/20 1159 12/10/20 1624 12/10/20 2043 12/11/20 0620 12/11/20 1112  GLUCAP 151* 181* 149* 121* 117*       Signed:  Florencia Reasons MD, PhD, FACP  [facility] 12/11/2020, 12:52 PM

## 2020-12-11 NOTE — Progress Notes (Addendum)
Pt for d/c to snf today. Covid test result faxed to [facility] per their request. Spoke to [place] at [facility] who is aware pt to d/c today and start tomorrow. Will fax d/c summary for continuation of care once available.   Melven Sartorius Renal Navigator [phone]   Addendum at 1:04 pm: D/C summary faxed to [facility] [phone]).

## 2020-12-11 NOTE — Progress Notes (Signed)
[facility] PROGRESS NOTE  Assessment/ Plan:  # Acute kidney injury on chronic kidney disease stage IIIb: Likely hemodynamically mediated in the setting of acute exacerbation of congestive heart failure.  Started CRRT for fluid overload refractory to diuretics which was a stopped on 12/2 after optimization of volume.  First intermittent HD on 12/3.  Patient remains anuric without sign of renal recovery.  Now receiving dialysis per TTS schedule, plan for next HD tomorrow.  I will order HD in case patient remains in the hospital. Status post right IJ TDC placed by IR on 12/8. CLIP: T[facility], can be started this coming Sat  #  Acute exacerbation of diastolic heart failure: With severe pulmonary hypertension and significant third spacing/volume overload.  Volume status improved with CRRT and now with HD.  Cardiology is following.  Continue fluid restriction.  # Encephalopathy: Likely multifactorial with hypercapnic respiratory failure/CO2 narcosis and azotemia contributing to possible hepatic encephalopathy with slightly elevated ammonia levels.  Mental status improved..  #  Anemia of chronic illness: He has severe iron deficiency with an iron saturation of 4% and ferritin of 31.  Treated with IV iron and received ESA on 11/27.  Hemoglobin level acceptable.  # Chronic atrial fibrillation: Developed A. fib with RVR managing with amiodarone.    #  Thrombocytopenia: Likely from splenic sequestration in the setting of cirrhosis, no evidence of bleeding diathesis.  Platelet count improved.  #Hypophosphatemia: Due to CRRT but now phosphorus level going up therefore he started PhosLo.  #Hypotension: Continue midodrine 15 mg 3 times daily.  #Hyponatremia, hypervolemic: Managed with dialysis, ultrafiltration as tolerated.  Subjective: Seen and examined.  Reports fatigue but denies nausea, vomiting, chest pain, shortness of breath.  No new event. Objective Vital signs  in last 24 hours: Vitals:   12/10/20 1645 12/10/20 1800 12/10/20 2015 12/11/20 0435  BP:  127/69 109/67 132/73  Pulse: 64 64 62 70  Resp: 16  (!) 24 19  Temp:   98.2 F (36.8 C)   TempSrc:   Oral   SpO2: 95% 97% 100% 97%  Weight:      Height:       Weight change:   Intake/Output Summary (Last 24 hours) at 12/11/2020 0802 Last data filed at 12/11/2020 0500 Gross per 24 hour  Intake 960.06 ml  Output --  Net 960.06 ml        Labs: Basic Metabolic Panel: Recent Labs  Lab 12/07/20 0649 12/08/20 1055 12/09/20 0953  NA 128* 130* 132*  K 4.6 4.4 5.0  CL 95* 96* 98  CO2 _0 GLUCOSE 160* 100* 120*  BUN 74* 59* 77*  CREATININE 3.80* 3.87* 5.01*  CALCIUM 8.5* 8.6* 8.7*  PHOS 5.3* 4.3 5.9*    Liver Function Tests: Recent Labs  Lab 12/07/20 0649 12/08/20 1055 12/09/20 0953  ALBUMIN 3.3* 3.2* 3.3*    No results for input(s): LIPASE, AMYLASE in the last 168 hours. No results for input(s): AMMONIA in the last 168 hours.  CBC: Recent Labs  Lab 12/07/20 0430 12/08/20 1055 12/09/20 0506 12/10/20 0455 12/11/20 0520  WBC 12.0* 12.1* 11.0* 8.3 9.0  HGB 10.7* 11.0* 10.9* 10.7* 10.7*  HCT 36.8* 38.0* 38.1* 37.2* 37.1*  MCV 91.8 92.7 92.0 93.9 92.8  PLT 169 129* 125* 109* 116*    Cardiac Enzymes: No results for input(s): CKTOTAL, CKMB, CKMBINDEX, TROPONINI in the last 168 hours. CBG: Recent Labs  Lab 12/10/20 0558 12/10/20 1159 12/10/20 1624 12/10/20 2043 12/11/20  0620  GLUCAP 138* 151* 181* 149* 121*     Iron Studies: No results for input(s): IRON, TIBC, TRANSFERRIN, FERRITIN in the last 72 hours. Studies/Results: DG Hand Complete Right  Result Date: 12/10/2020 CLINICAL DATA:  Tenderness EXAM: RIGHT HAND - COMPLETE 3+ VIEW COMPARISON:  None. FINDINGS: Diffuse osteopenia. Monitoring lead overlies the distal index finger. Extensive arterial calcifications. No fracture or dislocation. No radiodense foreign body. No subcutaneous gas. IMPRESSION:  Osteopenia.  No acute findings. Electronically Signed   By: Lucrezia Europe M.D.   On: 12/10/2020 12:16   IR TUNNELED CENTRAL VENOUS CATHETER PLACEMENT  Result Date: 12/10/2020 INDICATION: 70 year old male in need of durable hemodialysis access. He presents for placement of a tunneled HD catheter. EXAM: TUNNELED CENTRAL VENOUS HEMODIALYSIS CATHETER PLACEMENT WITH ULTRASOUND AND FLUOROSCOPIC GUIDANCE MEDICATIONS: 2 g Ancef. The antibiotic was given in an appropriate time interval prior to skin puncture. ANESTHESIA/SEDATION: Moderate (conscious) sedation was employed during this procedure. A total of Versed 1 mg and Fentanyl 50 mcg was administered intravenously. Moderate Sedation Time: 22 minutes. The patient's level of consciousness and vital signs were monitored continuously by radiology nursing throughout the procedure under my direct supervision. FLUOROSCOPY TIME:  Fluoroscopy Time: 1 minutes 24 seconds (35 mGy). COMPLICATIONS: None immediate. PROCEDURE: Informed written consent was obtained from the patient after a discussion of the risks, benefits, and alternatives to treatment. Questions regarding the procedure were encouraged and answered. The right neck and chest were prepped with chlorhexidine in a sterile fashion, and a sterile drape was applied covering the operative field. Maximum barrier sterile technique with sterile gowns and gloves were used for the procedure. A timeout was performed prior to the initiation of the procedure. After creating a small venotomy incision, a micropuncture kit was utilized to access the right internal jugular vein under direct, real-time ultrasound guidance after the overlying soft tissues were anesthetized with 1% lidocaine with epinephrine. Ultrasound image documentation was performed for the permanent electronic medical record. The microwire was kinked to measure appropriate catheter length. A stiff Glidewire was advanced to the level of the IVC and the micropuncture  sheath was exchanged for a peel-away sheath. A palindrome tunneled hemodialysis catheter measuring 19 cm from tip to cuff was tunneled in a retrograde fashion from the anterior chest wall to the venotomy incision. The catheter was then placed through the peel-away sheath with tips ultimately positioned within the superior aspect of the right atrium. Final catheter positioning was confirmed and documented with a spot radiographic image. The catheter aspirates and flushes normally. The catheter was flushed with appropriate volume heparin dwells. The catheter exit site was secured with a 0-Prolene retention suture. The venotomy incision was closed with Dermabond. Dressings were applied. The patient tolerated the procedure well without immediate post procedural complication. IMPRESSION: Successful placement of 19 cm tip to cuff tunneled hemodialysis catheter via the right internal jugular vein with tips terminating within the superior aspect of the right atrium. The catheter is ready for immediate use. Electronically Signed   By: Jacqulynn Cadet M.D.   On: 12/10/2020 16:59    Medications: Infusions:  sodium chloride Stopped (12/05/20 1754)    ceFAZolin (ANCEF) IV     heparin 2,250 Units/hr (12/10/20 2240)    Scheduled Medications:  (feeding supplement) PROSource Plus  30 mL Oral TID BM   allopurinol  300 mg Oral Daily   apixaban  5 mg Oral BID   atorvastatin  20 mg Oral Daily   B-complex with vitamin C  1  tablet Oral Daily   calcium acetate  667 mg Oral TID WC   Chlorhexidine Gluconate Cloth  6 each Topical Q0600   Chlorhexidine Gluconate Cloth  6 each Topical Q0600   darbepoetin (ARANESP) injection - NON-DIALYSIS  100 mcg Subcutaneous Q Sun-1800   insulin aspart  0-5 Units Subcutaneous QHS   insulin aspart  0-9 Units Subcutaneous TID WC   insulin glargine-yfgn  16 Units Subcutaneous QHS   lactulose  20 g Oral BID   mouth rinse  15 mL Mouth Rinse BID   metoprolol succinate  25 mg Oral Daily    midodrine  15 mg Oral TID WC   pantoprazole  40 mg Oral Daily   polyethylene glycol  17 g Oral Daily   sildenafil  20 mg Oral TID   sodium chloride flush  10-40 mL Intracatheter Q12H   Zinc Oxide   Topical BID    have reviewed scheduled and prn medications.  Physical Exam: General: Able to lie flat, not in distress Heart:RRR, s1s2 nl Lungs: Clear bilateral, no wheezing or work of breathing Abdomen:soft, Non-tender, non-distended Extremities: Wrapped with bandages in both legs, edema is much better now. Dialysis Access: Right IJ TDC in place, site clean.  William Flores Reesa Chew Carolan Avedisian 12/11/2020,8:02 AM  LOS: 19 days

## 2020-12-11 NOTE — Progress Notes (Signed)
Report called and given to [facility] at [facility]. All questions and concerns answered.

## 2020-12-11 NOTE — Care Management Important Message (Signed)
Important Message  Patient Details  Name: William Flores MRN: [id number] Date of Birth: May 24, 1950   Medicare Important Message Given:  Yes     [name] 12/11/2020, 9:41 AM

## 2020-12-12 DIAGNOSIS — N179 Acute kidney failure, unspecified: Secondary | ICD-10-CM | POA: Diagnosis not present

## 2020-12-12 DIAGNOSIS — N189 Chronic kidney disease, unspecified: Secondary | ICD-10-CM | POA: Diagnosis not present

## 2020-12-12 DIAGNOSIS — D509 Iron deficiency anemia, unspecified: Secondary | ICD-10-CM | POA: Diagnosis not present

## 2020-12-12 DIAGNOSIS — D649 Anemia, unspecified: Secondary | ICD-10-CM | POA: Diagnosis not present

## 2020-12-14 DIAGNOSIS — Z79899 Other long term (current) drug therapy: Secondary | ICD-10-CM | POA: Diagnosis not present

## 2020-12-14 DIAGNOSIS — I5033 Acute on chronic diastolic (congestive) heart failure: Secondary | ICD-10-CM | POA: Diagnosis not present

## 2020-12-14 DIAGNOSIS — N1832 Chronic kidney disease, stage 3b: Secondary | ICD-10-CM | POA: Diagnosis not present

## 2020-12-14 DIAGNOSIS — K7581 Nonalcoholic steatohepatitis (NASH): Secondary | ICD-10-CM | POA: Diagnosis not present

## 2020-12-15 DIAGNOSIS — D509 Iron deficiency anemia, unspecified: Secondary | ICD-10-CM | POA: Diagnosis not present

## 2020-12-15 DIAGNOSIS — N179 Acute kidney failure, unspecified: Secondary | ICD-10-CM | POA: Diagnosis not present

## 2020-12-15 DIAGNOSIS — N189 Chronic kidney disease, unspecified: Secondary | ICD-10-CM | POA: Diagnosis not present

## 2020-12-15 DIAGNOSIS — D649 Anemia, unspecified: Secondary | ICD-10-CM | POA: Diagnosis not present

## 2020-12-16 ENCOUNTER — Encounter (HOSPITAL_COMMUNITY): Payer: Medicare Other

## 2020-12-16 DIAGNOSIS — I272 Pulmonary hypertension, unspecified: Secondary | ICD-10-CM | POA: Diagnosis not present

## 2020-12-16 DIAGNOSIS — K7581 Nonalcoholic steatohepatitis (NASH): Secondary | ICD-10-CM | POA: Diagnosis not present

## 2020-12-16 DIAGNOSIS — I5033 Acute on chronic diastolic (congestive) heart failure: Secondary | ICD-10-CM | POA: Diagnosis not present

## 2020-12-16 DIAGNOSIS — K7460 Unspecified cirrhosis of liver: Secondary | ICD-10-CM | POA: Diagnosis not present

## 2020-12-17 DIAGNOSIS — N189 Chronic kidney disease, unspecified: Secondary | ICD-10-CM | POA: Diagnosis not present

## 2020-12-17 DIAGNOSIS — N179 Acute kidney failure, unspecified: Secondary | ICD-10-CM | POA: Diagnosis not present

## 2020-12-17 DIAGNOSIS — D509 Iron deficiency anemia, unspecified: Secondary | ICD-10-CM | POA: Diagnosis not present

## 2020-12-17 DIAGNOSIS — D649 Anemia, unspecified: Secondary | ICD-10-CM | POA: Diagnosis not present

## 2020-12-19 DIAGNOSIS — D509 Iron deficiency anemia, unspecified: Secondary | ICD-10-CM | POA: Diagnosis not present

## 2020-12-19 DIAGNOSIS — N189 Chronic kidney disease, unspecified: Secondary | ICD-10-CM | POA: Diagnosis not present

## 2020-12-19 DIAGNOSIS — N179 Acute kidney failure, unspecified: Secondary | ICD-10-CM | POA: Diagnosis not present

## 2020-12-19 DIAGNOSIS — D649 Anemia, unspecified: Secondary | ICD-10-CM | POA: Diagnosis not present

## 2020-12-21 DIAGNOSIS — I10 Essential (primary) hypertension: Secondary | ICD-10-CM | POA: Diagnosis not present

## 2020-12-21 DIAGNOSIS — I5033 Acute on chronic diastolic (congestive) heart failure: Secondary | ICD-10-CM | POA: Diagnosis not present

## 2020-12-21 DIAGNOSIS — N1832 Chronic kidney disease, stage 3b: Secondary | ICD-10-CM | POA: Diagnosis not present

## 2020-12-21 DIAGNOSIS — K7581 Nonalcoholic steatohepatitis (NASH): Secondary | ICD-10-CM | POA: Diagnosis not present

## 2020-12-22 DIAGNOSIS — N189 Chronic kidney disease, unspecified: Secondary | ICD-10-CM | POA: Diagnosis not present

## 2020-12-22 DIAGNOSIS — D509 Iron deficiency anemia, unspecified: Secondary | ICD-10-CM | POA: Diagnosis not present

## 2020-12-22 DIAGNOSIS — N179 Acute kidney failure, unspecified: Secondary | ICD-10-CM | POA: Diagnosis not present

## 2020-12-22 DIAGNOSIS — D649 Anemia, unspecified: Secondary | ICD-10-CM | POA: Diagnosis not present

## 2020-12-24 DIAGNOSIS — D649 Anemia, unspecified: Secondary | ICD-10-CM | POA: Diagnosis not present

## 2020-12-24 DIAGNOSIS — D509 Iron deficiency anemia, unspecified: Secondary | ICD-10-CM | POA: Diagnosis not present

## 2020-12-24 DIAGNOSIS — N179 Acute kidney failure, unspecified: Secondary | ICD-10-CM | POA: Diagnosis not present

## 2020-12-24 DIAGNOSIS — N189 Chronic kidney disease, unspecified: Secondary | ICD-10-CM | POA: Diagnosis not present

## 2020-12-25 NOTE — Progress Notes (Incomplete)
°Advanced Heart Failure Clinic Note  ° °PCP: [name], Joshua A, MD °HF Cardiology: Dr. McLean ° °HPI: ° °William Flores is a 70 y.o. male with history of chronic atrial fibrillation, chronic anticoagulation therapy w/ coumadin, h/o systolic HF (felt to be tachy-mediated, w/ eventual recovery of LVEF), chronic diastolic CHF, RV failure w/ pulmonary HTN, Diabetes, Obesity, OHS/OSA w/ poor compliance w/ CPAP, h/o AKI, h/o GIB, colon cancer treated w/ resection and h/o MGUS.   °  °Patient was admitted in 02/2014 with hypoxic respiratory failure with acute systolic CHF and possible PNA.  He was in atrial fibrillation with RVR and developed AKI. Cardiomyopathy (EF 35-40% by echo in 2016) thought to be tachycardia-mediated at the time.  He was admitted again in 4/16 with hypotension and AKI as well as multiple metabolic derangements.  He required CVVH.  Echo at that time showed EF 60-65%.  He was again admitted in 5/16, again with hypoxic respiratory failure and was intubated.  EF 60-65% by echo on this admission.  He again had AKI and had HD x 1. Renal function again recovered. Also, per records, he had a LHC in 2008 that showed no significant CAD.  °  °Had repeat echo 12/2018 which showed normal LVEF 50-55% w/ moderate LVH, normal LA size, mild MR, severely elevated pulmonary artery systolic pressure, 77 mmHg and moderately reduced right ventricular systolic function. Echo was obtained while admitted for acute anemia 2/2 acute GIB, in the setting of supratherpaeutic INR. INR was > 10.  Hgb 5.1. INR was reversed w/ Kcentra + Vit K. Treated w/ transfusion. Underwent GI w/u. EGD showed chronic gastritis but no clear source of bleeding. Biopsy negative for H Pylori. Colonoscopy showed several polyps, removed and biopsied. No high grade dysplasia. He was placed back on Coumadin. INRs followed by PCP.  °  °Admitted to [facility] for acute on chronic CHF 3/21. Also w/ AKI on admit. SCr was 1.8 (baseline 1.3-1.5). He was started on  IV lasix for diuresis, 60 mg bid. Diuresis sluggish and eventually changed to lasix gtt. SCr has worsened w/ attempts at diuresis, w/ SCr rising to 2.3. Echo repeated. LVEF 55-60%. RV severely enlarged w/ severely reduced RV systolic function. Pulmonary artery systolic pressure severely elevated at 76 mmhg. There is mild TR. Also mild to moderate aortic valve sclerosis/calcification is present, without any evidence of aortic stenosis. He was subsequently transferred to [facility] for further management of his HF w/ formal RHC and AHF consultation.  On arrival to [facility], He was placed on high dose IV Lasix w/ better urinary response/ diuresis. RHC 4/8 with elevated filling pressures, mixed pulmonary venous/pulmonary hypertension. V/Q scan negative for chronic PE. He was continued on IV diuretics and diuresed > 20 lb and placed on PO torsemide 40 mg bid.  SCr improved w/ diuresis. Also started on trial of sildenafil 20 mg tid. Advised to continue supplemental home O2 and to restart regular use of CPAP. Also of note, a multiple myeloma panel was done given MGUS and showed elevated M spike protein. Urine immunofixation and UPEP both unremarkable. Discharge wt was 261 lb.  ° °Patient was admitted in 6/22 with syncope.  He was at his PCP's office, stood up, and passed out.  AED was applied, no shock. CPR was done with ROSC < 1 minute. Echo showed EF 50-55%, mild LVH, D-shaped septum, severe RV enlargement with severely decreased RV systolic function, PASP 90, IVC dilated.  He was discharged to a rehab facility in [place]. He   was supposed to get a Zio monitor at discharge but this was not done.  Creatinine was elevated to 2.5 during this hospitalization.  ° °In 10/22, he was admitted with GI bleeding/hematochezia.  He had large, oozing rectal polyps with high grade dysplasia.   ° °Admitted to [facility] 11/22/20 with A/C HFpEF. Failed escalating diuretic regimen at home. Weight was up 30 pounds from August. US abd with moderate ascites.  Started on IV lasix. Echo this admit EF 55-60% and severe RV dysfunction + dilated IVC.  °Sluggish diuresis noted. Had Paracentesis with 2.4 liters removed. Started on milrinone and Lasix gtt. Nephrology consulted and patient required transfer to ICU for CRRT. He remained in afib but rate controlled on amiodarone gtt. Able to wean gtts and transition to iHD T/Th/Sat, 1st session 12/05/20. Seen by palliative care, patient elected to be full code. He was discharged back to [facility], weight 229 lbs. ° °Labs (6/22): K 4.8, creatinine 2.5 °Labs (7/22): K 3.8, creatinine 2.34 °Labs (11/22): ANA 1:80, K 4.1, creatinine 2.22, LFTs normal, hgb 9 °Labs (12/22): Hgb 10.7 ° °ROS: All systems reviewed and negative except as per HPI.  ° °PMH: °1. Chronic atrial fibrillation.  °- Zio monitor (9/22): atrial fibrillation, average rate 81 bpm.  4% PVCs, 2 short NSVT runs.  °2. Chronic systolic => diastolic CHF with prominent RV failure: LHC 2008 with no significant CAD but EF 25%.  Echo (2/16) with EF 35-40% (?tachy-mediated CMP).  Echo (5/16) with EF 65-60%, mild MR, mild RV dilation with mildly decreased RV systolic function.  °- RHC (4/21): mean RA 16, PA 95/32 mean 53, mean PCWP 23, CI 2.71, PVR 5 WU.  °- echo (4/21): EF 55-60%, severe RV dilation and severe RV dysfunction, PASP 76 mmHg.  °- PYP scan (5/21): grade 1, H/CL 0.9 (likely negative).  °- Echo (6/22): EF 50-55%, mild LVH, D-shaped septum, severe RV enlargement with severely decreased RV systolic function, PASP 90, IVC dilated. °3. OHS/OSA: Uses CPAP at night, oxygen during the day.  °4. Colon cancer s/p resection °5. Type II diabetes °6. CKD stage 3: With episodes of AKI. °7. Hyperlipidemia °8. HTN °9. Pulmonary hypertension: Suspect group 3 PH.  °- RHC (4/21): mean RA 16, PA 95/32 mean 53, mean PCWP 23, CI 2.71, PVR 5 WU.  °- echo (4/21): EF 55-60%, severe RV dilation and severe RV dysfunction, PASP 76 mmHg.  °- V/Q scan 4/21 was negative.  °10. H/o GI bleeding.   °- 10/22 GI bleeding from large rectal polyps, high grade dysplasia noted.  °11. Syncope: 6/22.  °12. Cirrhosis: By imaging.  With splenomegaly and ascites.  ° °Current Outpatient Medications  °Medication Sig Dispense Refill  ° acetaminophen (TYLENOL) 325 MG tablet Take 2 tablets (650 mg total) by mouth every 6 (six) hours as needed for mild pain (or Fever >/= 101). 12 tablet 0  ° allopurinol (ZYLOPRIM) 300 MG tablet Take 1 tablet (300 mg total) by mouth daily. 90 tablet 3  ° apixaban (ELIQUIS) 5 MG TABS tablet Take 1 tablet (5 mg total) by mouth 2 (two) times daily. 60 tablet   ° atorvastatin (LIPITOR) 20 MG tablet TAKE 1 TABLET BY MOUTH EVERY DAY 90 tablet 3  ° B Complex-C (B-COMPLEX WITH VITAMIN C) tablet Take 1 tablet by mouth daily.    ° benzonatate (TESSALON) 100 MG capsule Take 1 capsule (100 mg total) by mouth 2 (two) times daily as needed for cough.    ° ferrous sulfate 325 (65 FE) MG   FE) MG tablet Take 325 mg by mouth daily with breakfast.      glucose blood (CONTOUR NEXT TEST) test strip Test BS TID Dx E11.29 300 strip 3   insulin aspart (NOVOLOG) 100 UNIT/ML injection Before each meal 3 times a day, 140-199 - 2 units, 200-250 - 4 units, 251-299 - 6 units,  300-349 - 8 units,  350 or above 10 units. Insulin PEN if approved, provide syringes and needles if needed. 10 mL 0   insulin glargine-yfgn (SEMGLEE) 100 UNIT/ML Pen Inject 16 Units into the skin at bedtime.     Insulin Pen Needle 32G X 4 MM MISC Use to give insulin daily Dx E11.29 100 each 3   lactulose (CHRONULAC) 10 GM/15ML solution Take 30 mLs (20 g total) by mouth 2 (two) times daily. 236 mL 0   melatonin 5 MG TABS Take 5 mg by mouth at bedtime as needed.     metoprolol succinate (TOPROL-XL) 25 MG 24 hr tablet Take 1 tablet (25 mg total) by mouth daily.     midodrine (PROAMATINE) 5 MG tablet Take 3 tablets (15 mg total) by mouth 3 (three) times daily with meals.     oxyCODONE (OXY IR/ROXICODONE) 5 MG immediate release tablet Take 1 tablet (5  mg total) by mouth every 6 (six) hours as needed for moderate pain or severe pain. 5 tablet 0   OXYGEN Inhale into the lungs. Continuous     pantoprazole (PROTONIX) 40 MG tablet TAKE 1 TABLET BY MOUTH EVERY DAY 90 tablet 1   polyethylene glycol (MIRALAX / GLYCOLAX) 17 g packet Take 17 g by mouth daily. 14 each 0   sildenafil (REVATIO) 20 MG tablet Take 1 tablet (20 mg total) by mouth 3 (three) times daily. 90 tablet 11   Zinc Oxide (TRIPLE PASTE) 12.8 % ointment Apply topically 2 (two) times daily. 56.7 g 0   No current facility-administered medications for this visit.   Allergies  Allergen Reactions   Codeine     Headache    Social History   Socioeconomic History   Marital status: Divorced    Spouse name: Not on file   Number of children: 1   Years of education: 64   Highest education level: 12th grade  Occupational History    Employer: [employer]  Tobacco Use   Smoking status: Former    Types: Cigarettes    Quit date: 08/06/1983    Years since quitting: 37.4   Smokeless tobacco: Never  Vaping Use   Vaping Use: Never used  Substance and Sexual Activity   Alcohol use: No   Drug use: No   Sexual activity: Not Currently  Other Topics Concern   Not on file  Social History Narrative   Lives alone   Social Determinants of Health   Financial Resource Strain: Low Risk    Difficulty of Paying Living Expenses: Not very hard  Food Insecurity: No Food Insecurity   Worried About Running Out of Food in the Last Year: Never true   Ran Out of Food in the Last Year: Never true  Transportation Needs: No Transportation Needs   Lack of Transportation (Medical): No   Lack of Transportation (Non-Medical): No  Physical Activity: Inactive   Days of Exercise per Week: 0 days   Minutes of Exercise per Session: 0 min  Stress: Not on file  Social Connections: Socially Isolated   Frequency of Communication with Friends and Family: More than three times a week   Frequency  Gatherings with Friends and Family: More than three times a week  ° Attends Religious Services: Never  ° Active Member of Clubs or Organizations: No  ° Attends Club or Organization Meetings: Never  ° Marital Status: Divorced  °Intimate Partner Violence: Not on file  ° °Family History  °Problem Relation Age of Onset  ° Breast cancer Mother   ° Diabetes Mother   ° Heart attack Father   ° °There were no vitals taken for this visit. ° °Wt Readings from Last 3 Encounters:  °12/10/20 104 kg (229 lb 4.5 oz)  °11/20/20 130.2 kg (287 lb)  °10/24/20 118.8 kg (262 lb)  ° °PHYSICAL EXAM: °General:  NAD. No resp difficulty, chronically-ill appearing. °HEENT: Normal °Neck: Supple. No JVD. Carotids 2+ bilat; no bruits. No lymphadenopathy or thryomegaly appreciated. °Cor: PMI nondisplaced. Irregular rate & rhythm. No rubs, gallops or murmurs. °Lungs: Clear °Abdomen: Soft, nontender, nondistended. No hepatosplenomegaly. No bruits or masses. Good bowel sounds. °Extremities: No cyanosis, clubbing, rash, edema °Neuro: Alert & oriented x 3, cranial nerves grossly intact. Moves all 4 extremities w/o difficulty. Affect pleasant. ° °ASSESSMENT & PLAN: °1. Cor pulmonale/RV failure: Suspect primarily due to OHS/OSA. Echo in 4/21 with EF 55-60%, RV severely dilated w/ severe RV dysfunction in the setting of severe pulmonary hypertension w/ PASP 76 mmHg. RHC 4/21 with elevated filling pressures, mixed pulmonary venous/pulmonary hypertension=> diuresed w/ IV Lasix in hospital and placed on po torsemide. Echo in 11/22 showed EF 55-60%, mod LVH, D-shaped septum, severe RV enlargement with severely decreased RV systolic function, IVC dilated. NYHA class IV. Now on iHD. Seems to  be tolerating well. °- Volume per iHD/nephrology  °- Continues midodrine 15 mg tid for BP support. °2. Pulmonary HTN: PASP elevated at 76 mmHg by 4/21 echo estimate with RV systolic function severely reduced. Most likely poorly controlled OHS/OSA as cause (group 3 PH).   V/Q scan negative for chronic PE.  Mixed PVH/PAH on RHC.  Echo in 10/22 with severe RV failure and again this admission.   °- Continue sildenafil 20 mg tid.  °- Continue home oxygen and CPAP at night.    °3. ESRD: Anuric and now HD dependent.  °- iHD per nephrology °- planning to convert from temp HD cath to tunneled HD cath w/ IR  °4. Atrial fibrillation: Chronic, unlikely to be able to successfully cardiovert given long-standing atrial fibrillation.   °- Now off amio °- Continue Toprol XL 25 mg daily  °- Continue heparin gtt until perm HD cath placed then transition to Eliquis 5 bid  °5. PVCs: Now minimal.  °- Continue Toprol XL  °- Keep K > 4 and Mag > 2. °6. OSA/OHS: Continue CPAP + oxygen.   °7. Diabetes: On insulin. °8. MGUS: Previously followed by hem/onc but has not seen in several years.  Multiple myeloma panel 4/21 with presence of M-spike. °9. Cirrhosis: ?Due to NAFLD.  11/23 Ammonia 48. Paracentesis 2.4 L this admission. Mental status improved.  °- Continue lactulose.  °10. Anemia: last Hgb 10.7. No obvious source of bleeding.  Has had IV Fe.   °11. Encephalopathy: Suspect CO2 narcosis + possible component hepatic encephalopathy with elevated NH3.  Alert/oriented this morning, overall improved.    °- He remains on lactulose.  °12. Thrombocytopenia: In setting of acute inflammatory illness and possibly splenic sequestration with cirrhosis. Plts were low before heparin gtt started, now normal range.  °13. Rt Hand pain: Middle digit MCP joint. H/o gout. Pain feels similar.  Improved w/   prednisone (now off).  °14. GOC: Lives in SNF. Now with end stage RV failure & ESRD requiring iHD. Palliative care saw inpatient and he elected to remain full code. Seems to be doing ok now. ° °Follow up with Dr. McLean in 2-3 months. ° °Jessica Milford, FNP-BC °12/30/20 ° ° °

## 2020-12-26 DIAGNOSIS — N179 Acute kidney failure, unspecified: Secondary | ICD-10-CM | POA: Diagnosis not present

## 2020-12-26 DIAGNOSIS — N189 Chronic kidney disease, unspecified: Secondary | ICD-10-CM | POA: Diagnosis not present

## 2020-12-26 DIAGNOSIS — D509 Iron deficiency anemia, unspecified: Secondary | ICD-10-CM | POA: Diagnosis not present

## 2020-12-26 DIAGNOSIS — D649 Anemia, unspecified: Secondary | ICD-10-CM | POA: Diagnosis not present

## 2020-12-29 DIAGNOSIS — I4891 Unspecified atrial fibrillation: Secondary | ICD-10-CM | POA: Diagnosis not present

## 2020-12-29 DIAGNOSIS — N189 Chronic kidney disease, unspecified: Secondary | ICD-10-CM | POA: Diagnosis not present

## 2020-12-29 DIAGNOSIS — Z79899 Other long term (current) drug therapy: Secondary | ICD-10-CM | POA: Diagnosis not present

## 2020-12-29 DIAGNOSIS — D649 Anemia, unspecified: Secondary | ICD-10-CM | POA: Diagnosis not present

## 2020-12-29 DIAGNOSIS — E119 Type 2 diabetes mellitus without complications: Secondary | ICD-10-CM | POA: Diagnosis not present

## 2020-12-29 DIAGNOSIS — I2510 Atherosclerotic heart disease of native coronary artery without angina pectoris: Secondary | ICD-10-CM | POA: Diagnosis not present

## 2020-12-29 DIAGNOSIS — N179 Acute kidney failure, unspecified: Secondary | ICD-10-CM | POA: Diagnosis not present

## 2020-12-29 DIAGNOSIS — D509 Iron deficiency anemia, unspecified: Secondary | ICD-10-CM | POA: Diagnosis not present

## 2020-12-30 ENCOUNTER — Ambulatory Visit (HOSPITAL_COMMUNITY)
Admission: RE | Admit: 2020-12-30 | Discharge: 2020-12-30 | Disposition: A | Discharge status: Home / Self Care | Payer: Medicare Other | Source: Ambulatory Visit | Attending: Family Medicine | Admitting: Family Medicine

## 2020-12-30 ENCOUNTER — Other Ambulatory Visit: Payer: Self-pay

## 2020-12-31 DIAGNOSIS — D649 Anemia, unspecified: Secondary | ICD-10-CM | POA: Diagnosis not present

## 2020-12-31 DIAGNOSIS — D509 Iron deficiency anemia, unspecified: Secondary | ICD-10-CM | POA: Diagnosis not present

## 2020-12-31 DIAGNOSIS — N189 Chronic kidney disease, unspecified: Secondary | ICD-10-CM | POA: Diagnosis not present

## 2020-12-31 DIAGNOSIS — N179 Acute kidney failure, unspecified: Secondary | ICD-10-CM | POA: Diagnosis not present

## 2021-01-02 DIAGNOSIS — D649 Anemia, unspecified: Secondary | ICD-10-CM | POA: Diagnosis not present

## 2021-01-02 DIAGNOSIS — N179 Acute kidney failure, unspecified: Secondary | ICD-10-CM | POA: Diagnosis not present

## 2021-01-02 DIAGNOSIS — D509 Iron deficiency anemia, unspecified: Secondary | ICD-10-CM | POA: Diagnosis not present

## 2021-01-02 DIAGNOSIS — N189 Chronic kidney disease, unspecified: Secondary | ICD-10-CM | POA: Diagnosis not present

## 2021-01-05 DIAGNOSIS — N390 Urinary tract infection, site not specified: Secondary | ICD-10-CM | POA: Diagnosis not present

## 2021-01-05 DIAGNOSIS — D649 Anemia, unspecified: Secondary | ICD-10-CM | POA: Diagnosis not present

## 2021-01-05 DIAGNOSIS — N179 Acute kidney failure, unspecified: Secondary | ICD-10-CM | POA: Diagnosis not present

## 2021-01-05 DIAGNOSIS — N189 Chronic kidney disease, unspecified: Secondary | ICD-10-CM | POA: Diagnosis not present

## 2021-01-05 DIAGNOSIS — D509 Iron deficiency anemia, unspecified: Secondary | ICD-10-CM | POA: Diagnosis not present

## 2021-01-05 DIAGNOSIS — E559 Vitamin D deficiency, unspecified: Secondary | ICD-10-CM | POA: Diagnosis not present

## 2021-01-05 DIAGNOSIS — B9689 Other specified bacterial agents as the cause of diseases classified elsewhere: Secondary | ICD-10-CM | POA: Diagnosis not present

## 2021-01-06 DIAGNOSIS — K7581 Nonalcoholic steatohepatitis (NASH): Secondary | ICD-10-CM | POA: Diagnosis not present

## 2021-01-06 DIAGNOSIS — K7460 Unspecified cirrhosis of liver: Secondary | ICD-10-CM | POA: Diagnosis not present

## 2021-01-06 DIAGNOSIS — N186 End stage renal disease: Secondary | ICD-10-CM | POA: Diagnosis not present

## 2021-01-06 DIAGNOSIS — Z992 Dependence on renal dialysis: Secondary | ICD-10-CM | POA: Diagnosis not present

## 2021-01-07 ENCOUNTER — Telehealth: Payer: Self-pay | Admitting: Family Medicine

## 2021-01-07 DIAGNOSIS — E559 Vitamin D deficiency, unspecified: Secondary | ICD-10-CM | POA: Diagnosis not present

## 2021-01-07 DIAGNOSIS — I5022 Chronic systolic (congestive) heart failure: Secondary | ICD-10-CM | POA: Diagnosis not present

## 2021-01-07 DIAGNOSIS — N179 Acute kidney failure, unspecified: Secondary | ICD-10-CM | POA: Diagnosis not present

## 2021-01-07 DIAGNOSIS — D509 Iron deficiency anemia, unspecified: Secondary | ICD-10-CM | POA: Diagnosis not present

## 2021-01-07 DIAGNOSIS — E039 Hypothyroidism, unspecified: Secondary | ICD-10-CM | POA: Diagnosis not present

## 2021-01-07 DIAGNOSIS — D649 Anemia, unspecified: Secondary | ICD-10-CM | POA: Diagnosis not present

## 2021-01-07 DIAGNOSIS — N390 Urinary tract infection, site not specified: Secondary | ICD-10-CM | POA: Diagnosis not present

## 2021-01-07 DIAGNOSIS — N189 Chronic kidney disease, unspecified: Secondary | ICD-10-CM | POA: Diagnosis not present

## 2021-01-09 DIAGNOSIS — D649 Anemia, unspecified: Secondary | ICD-10-CM | POA: Diagnosis not present

## 2021-01-09 DIAGNOSIS — N189 Chronic kidney disease, unspecified: Secondary | ICD-10-CM | POA: Diagnosis not present

## 2021-01-09 DIAGNOSIS — N179 Acute kidney failure, unspecified: Secondary | ICD-10-CM | POA: Diagnosis not present

## 2021-01-09 DIAGNOSIS — N390 Urinary tract infection, site not specified: Secondary | ICD-10-CM | POA: Diagnosis not present

## 2021-01-09 DIAGNOSIS — E559 Vitamin D deficiency, unspecified: Secondary | ICD-10-CM | POA: Diagnosis not present

## 2021-01-09 DIAGNOSIS — D509 Iron deficiency anemia, unspecified: Secondary | ICD-10-CM | POA: Diagnosis not present

## 2021-01-12 ENCOUNTER — Encounter (HOSPITAL_COMMUNITY): Payer: Medicare Other

## 2021-01-12 DIAGNOSIS — D649 Anemia, unspecified: Secondary | ICD-10-CM | POA: Diagnosis not present

## 2021-01-12 DIAGNOSIS — D509 Iron deficiency anemia, unspecified: Secondary | ICD-10-CM | POA: Diagnosis not present

## 2021-01-12 DIAGNOSIS — N390 Urinary tract infection, site not specified: Secondary | ICD-10-CM | POA: Diagnosis not present

## 2021-01-12 DIAGNOSIS — E559 Vitamin D deficiency, unspecified: Secondary | ICD-10-CM | POA: Diagnosis not present

## 2021-01-12 DIAGNOSIS — N179 Acute kidney failure, unspecified: Secondary | ICD-10-CM | POA: Diagnosis not present

## 2021-01-12 DIAGNOSIS — N189 Chronic kidney disease, unspecified: Secondary | ICD-10-CM | POA: Diagnosis not present

## 2021-01-14 DIAGNOSIS — E559 Vitamin D deficiency, unspecified: Secondary | ICD-10-CM | POA: Diagnosis not present

## 2021-01-14 DIAGNOSIS — N390 Urinary tract infection, site not specified: Secondary | ICD-10-CM | POA: Diagnosis not present

## 2021-01-14 DIAGNOSIS — I10 Essential (primary) hypertension: Secondary | ICD-10-CM | POA: Diagnosis not present

## 2021-01-14 DIAGNOSIS — N179 Acute kidney failure, unspecified: Secondary | ICD-10-CM | POA: Diagnosis not present

## 2021-01-14 DIAGNOSIS — D509 Iron deficiency anemia, unspecified: Secondary | ICD-10-CM | POA: Diagnosis not present

## 2021-01-14 DIAGNOSIS — Z992 Dependence on renal dialysis: Secondary | ICD-10-CM | POA: Diagnosis not present

## 2021-01-14 DIAGNOSIS — Z79899 Other long term (current) drug therapy: Secondary | ICD-10-CM | POA: Diagnosis not present

## 2021-01-14 DIAGNOSIS — N189 Chronic kidney disease, unspecified: Secondary | ICD-10-CM | POA: Diagnosis not present

## 2021-01-14 DIAGNOSIS — D649 Anemia, unspecified: Secondary | ICD-10-CM | POA: Diagnosis not present

## 2021-01-14 DIAGNOSIS — D631 Anemia in chronic kidney disease: Secondary | ICD-10-CM | POA: Diagnosis not present

## 2021-01-16 DIAGNOSIS — D649 Anemia, unspecified: Secondary | ICD-10-CM | POA: Diagnosis not present

## 2021-01-16 DIAGNOSIS — E559 Vitamin D deficiency, unspecified: Secondary | ICD-10-CM | POA: Diagnosis not present

## 2021-01-16 DIAGNOSIS — D509 Iron deficiency anemia, unspecified: Secondary | ICD-10-CM | POA: Diagnosis not present

## 2021-01-16 DIAGNOSIS — N390 Urinary tract infection, site not specified: Secondary | ICD-10-CM | POA: Diagnosis not present

## 2021-01-16 DIAGNOSIS — N179 Acute kidney failure, unspecified: Secondary | ICD-10-CM | POA: Diagnosis not present

## 2021-01-16 DIAGNOSIS — N189 Chronic kidney disease, unspecified: Secondary | ICD-10-CM | POA: Diagnosis not present

## 2021-01-18 DIAGNOSIS — R319 Hematuria, unspecified: Secondary | ICD-10-CM | POA: Diagnosis not present

## 2021-01-18 DIAGNOSIS — N186 End stage renal disease: Secondary | ICD-10-CM | POA: Diagnosis not present

## 2021-01-18 DIAGNOSIS — I5032 Chronic diastolic (congestive) heart failure: Secondary | ICD-10-CM | POA: Diagnosis not present

## 2021-01-18 DIAGNOSIS — N390 Urinary tract infection, site not specified: Secondary | ICD-10-CM | POA: Diagnosis not present

## 2021-01-19 DIAGNOSIS — N179 Acute kidney failure, unspecified: Secondary | ICD-10-CM | POA: Diagnosis not present

## 2021-01-19 DIAGNOSIS — N390 Urinary tract infection, site not specified: Secondary | ICD-10-CM | POA: Diagnosis not present

## 2021-01-19 DIAGNOSIS — Z79899 Other long term (current) drug therapy: Secondary | ICD-10-CM | POA: Diagnosis not present

## 2021-01-19 DIAGNOSIS — E559 Vitamin D deficiency, unspecified: Secondary | ICD-10-CM | POA: Diagnosis not present

## 2021-01-19 DIAGNOSIS — D509 Iron deficiency anemia, unspecified: Secondary | ICD-10-CM | POA: Diagnosis not present

## 2021-01-19 DIAGNOSIS — D631 Anemia in chronic kidney disease: Secondary | ICD-10-CM | POA: Diagnosis not present

## 2021-01-19 DIAGNOSIS — R319 Hematuria, unspecified: Secondary | ICD-10-CM | POA: Diagnosis not present

## 2021-01-19 DIAGNOSIS — N189 Chronic kidney disease, unspecified: Secondary | ICD-10-CM | POA: Diagnosis not present

## 2021-01-19 DIAGNOSIS — D649 Anemia, unspecified: Secondary | ICD-10-CM | POA: Diagnosis not present

## 2021-01-21 DIAGNOSIS — N189 Chronic kidney disease, unspecified: Secondary | ICD-10-CM | POA: Diagnosis not present

## 2021-01-21 DIAGNOSIS — N179 Acute kidney failure, unspecified: Secondary | ICD-10-CM | POA: Diagnosis not present

## 2021-01-21 DIAGNOSIS — D649 Anemia, unspecified: Secondary | ICD-10-CM | POA: Diagnosis not present

## 2021-01-21 DIAGNOSIS — D509 Iron deficiency anemia, unspecified: Secondary | ICD-10-CM | POA: Diagnosis not present

## 2021-01-21 DIAGNOSIS — E559 Vitamin D deficiency, unspecified: Secondary | ICD-10-CM | POA: Diagnosis not present

## 2021-01-21 DIAGNOSIS — N390 Urinary tract infection, site not specified: Secondary | ICD-10-CM | POA: Diagnosis not present

## 2021-01-21 DIAGNOSIS — R319 Hematuria, unspecified: Secondary | ICD-10-CM | POA: Diagnosis not present

## 2021-01-23 DIAGNOSIS — D631 Anemia in chronic kidney disease: Secondary | ICD-10-CM | POA: Diagnosis not present

## 2021-01-23 DIAGNOSIS — Z992 Dependence on renal dialysis: Secondary | ICD-10-CM | POA: Diagnosis not present

## 2021-01-23 DIAGNOSIS — N179 Acute kidney failure, unspecified: Secondary | ICD-10-CM | POA: Diagnosis not present

## 2021-01-23 DIAGNOSIS — D509 Iron deficiency anemia, unspecified: Secondary | ICD-10-CM | POA: Diagnosis not present

## 2021-01-23 DIAGNOSIS — N186 End stage renal disease: Secondary | ICD-10-CM | POA: Diagnosis not present

## 2021-01-25 ENCOUNTER — Ambulatory Visit (HOSPITAL_COMMUNITY)
Admission: RE | Admit: 2021-01-25 | Discharge: 2021-01-25 | Disposition: A | Discharge status: Home / Self Care | Payer: Medicare Other | Source: Ambulatory Visit | Attending: Family Medicine | Admitting: Family Medicine

## 2021-01-25 ENCOUNTER — Other Ambulatory Visit: Payer: Self-pay

## 2021-01-25 ENCOUNTER — Encounter (HOSPITAL_COMMUNITY): Payer: Self-pay

## 2021-01-25 VITALS — BP 144/70 | HR 92 | Wt 240.4 lb

## 2021-01-25 DIAGNOSIS — D472 Monoclonal gammopathy: Secondary | ICD-10-CM | POA: Diagnosis not present

## 2021-01-25 DIAGNOSIS — K760 Fatty (change of) liver, not elsewhere classified: Secondary | ICD-10-CM | POA: Insufficient documentation

## 2021-01-25 DIAGNOSIS — D6959 Other secondary thrombocytopenia: Secondary | ICD-10-CM | POA: Diagnosis not present

## 2021-01-25 DIAGNOSIS — Z79899 Other long term (current) drug therapy: Secondary | ICD-10-CM | POA: Insufficient documentation

## 2021-01-25 DIAGNOSIS — I2729 Other secondary pulmonary hypertension: Secondary | ICD-10-CM | POA: Diagnosis not present

## 2021-01-25 DIAGNOSIS — I50812 Chronic right heart failure: Secondary | ICD-10-CM | POA: Diagnosis not present

## 2021-01-25 DIAGNOSIS — Z7901 Long term (current) use of anticoagulants: Secondary | ICD-10-CM | POA: Insufficient documentation

## 2021-01-25 DIAGNOSIS — G4733 Obstructive sleep apnea (adult) (pediatric): Secondary | ICD-10-CM | POA: Insufficient documentation

## 2021-01-25 DIAGNOSIS — I2781 Cor pulmonale (chronic): Secondary | ICD-10-CM | POA: Diagnosis not present

## 2021-01-25 DIAGNOSIS — I429 Cardiomyopathy, unspecified: Secondary | ICD-10-CM | POA: Diagnosis not present

## 2021-01-25 DIAGNOSIS — I5042 Chronic combined systolic (congestive) and diastolic (congestive) heart failure: Secondary | ICD-10-CM | POA: Diagnosis not present

## 2021-01-25 DIAGNOSIS — N186 End stage renal disease: Secondary | ICD-10-CM | POA: Diagnosis not present

## 2021-01-25 DIAGNOSIS — Z794 Long term (current) use of insulin: Secondary | ICD-10-CM | POA: Diagnosis not present

## 2021-01-25 DIAGNOSIS — E785 Hyperlipidemia, unspecified: Secondary | ICD-10-CM | POA: Insufficient documentation

## 2021-01-25 DIAGNOSIS — I132 Hypertensive heart and chronic kidney disease with heart failure and with stage 5 chronic kidney disease, or end stage renal disease: Secondary | ICD-10-CM | POA: Diagnosis not present

## 2021-01-25 DIAGNOSIS — Z9989 Dependence on other enabling machines and devices: Secondary | ICD-10-CM

## 2021-01-25 DIAGNOSIS — Z8669 Personal history of other diseases of the nervous system and sense organs: Secondary | ICD-10-CM

## 2021-01-25 DIAGNOSIS — I493 Ventricular premature depolarization: Secondary | ICD-10-CM | POA: Diagnosis not present

## 2021-01-25 DIAGNOSIS — D696 Thrombocytopenia, unspecified: Secondary | ICD-10-CM

## 2021-01-25 DIAGNOSIS — E669 Obesity, unspecified: Secondary | ICD-10-CM | POA: Diagnosis not present

## 2021-01-25 DIAGNOSIS — D649 Anemia, unspecified: Secondary | ICD-10-CM | POA: Insufficient documentation

## 2021-01-25 DIAGNOSIS — I4891 Unspecified atrial fibrillation: Secondary | ICD-10-CM

## 2021-01-25 DIAGNOSIS — Z8249 Family history of ischemic heart disease and other diseases of the circulatory system: Secondary | ICD-10-CM | POA: Insufficient documentation

## 2021-01-25 DIAGNOSIS — Z992 Dependence on renal dialysis: Secondary | ICD-10-CM | POA: Diagnosis not present

## 2021-01-25 DIAGNOSIS — I272 Pulmonary hypertension, unspecified: Secondary | ICD-10-CM

## 2021-01-25 DIAGNOSIS — I482 Chronic atrial fibrillation, unspecified: Secondary | ICD-10-CM | POA: Diagnosis not present

## 2021-01-25 DIAGNOSIS — Z7189 Other specified counseling: Secondary | ICD-10-CM | POA: Diagnosis not present

## 2021-01-25 DIAGNOSIS — E1122 Type 2 diabetes mellitus with diabetic chronic kidney disease: Secondary | ICD-10-CM | POA: Insufficient documentation

## 2021-01-25 DIAGNOSIS — N183 Chronic kidney disease, stage 3 unspecified: Secondary | ICD-10-CM

## 2021-01-25 NOTE — Patient Instructions (Addendum)
Thank you for coming in today  Your physician recommends that you schedule a follow-up appointment in: in 3 months with Dr. Aundra Dubin  STOP Midodrine  At the [facility], you and your health needs are our priority. As part of our continuing mission to provide you with exceptional heart care, we have created designated Provider Care Teams. These Care Teams include your primary Cardiologist (physician) and Advanced Practice Providers (APPs- Physician Assistants and Nurse Practitioners) who all work together to provide you with the care you need, when you need it.   You may see any of the following providers on your designated Care Team at your next follow up: Dr Glori Bickers Dr Haynes Kerns, NP Lyda Jester, [place] [facility] [place], [place] Audry Riles, PharmD   Please be sure to bring in all your medications bottles to every appointment.   If you have any questions or concerns before your next appointment please send [place] a message through [place] or call our office at [phone].    TO LEAVE A MESSAGE FOR THE NURSE SELECT OPTION 2, PLEASE LEAVE A MESSAGE INCLUDING: YOUR NAME DATE OF BIRTH CALL BACK NUMBER REASON FOR CALL**this is important as we prioritize the call backs  YOU WILL RECEIVE A CALL BACK THE SAME DAY AS LONG AS YOU CALL BEFORE 4:00 PM

## 2021-01-25 NOTE — Progress Notes (Signed)
Advanced Heart Failure Clinic Note   PCP: [name], [name], MD HF Cardiology: Dr. Aundra Dubin  HPI:  William Flores is a 71 y.o. male with history of chronic atrial fibrillation, chronic anticoagulation therapy w/ coumadin, h/o systolic HF (felt to be tachy-mediated, w/ eventual recovery of LVEF), chronic diastolic CHF, RV failure w/ pulmonary HTN, Diabetes, Obesity, OHS/OSA w/ poor compliance w/ CPAP, h/o AKI, h/o GIB, colon cancer treated w/ resection and h/o MGUS.     Patient was admitted in 02/2014 with hypoxic respiratory failure with acute systolic CHF and possible PNA.  He was in atrial fibrillation with RVR and developed AKI. Cardiomyopathy (EF 35-40% by echo in 2016) thought to be tachycardia-mediated at the time.  He was admitted again in 4/16 with hypotension and AKI as well as multiple metabolic derangements.  He required CVVH.  Echo at that time showed EF 60-65%.  He was again admitted in 5/16, again with hypoxic respiratory failure and was intubated.  EF 60-65% by echo on this admission.  He again had AKI and had HD x 1. Renal function again recovered. Also, per records, he had a LHC in 2008 that showed no significant CAD.    Had repeat echo 12/2018 which showed normal LVEF 50-55% w/ moderate LVH, normal LA size, mild MR, severely elevated pulmonary artery systolic pressure, 77 mmHg and moderately reduced right ventricular systolic function. Echo was obtained while admitted for acute anemia 2/2 acute GIB, in the setting of supratherpaeutic INR. INR was > 10.  Hgb 5.1. INR was reversed w/ Kcentra + Vit K. Treated w/ transfusion. Underwent GI w/u. EGD showed chronic gastritis but no clear source of bleeding. Biopsy negative for H Pylori. Colonoscopy showed several polyps, removed and biopsied. No high grade dysplasia. He was placed back on Coumadin. INRs followed by PCP.    Admitted to [facility] for acute on chronic CHF 3/21. Also w/ AKI on admit. SCr was 1.8 (baseline 1.3-1.5). He was started on  IV lasix for diuresis, 60 mg bid. Diuresis sluggish and eventually changed to lasix gtt. SCr has worsened w/ attempts at diuresis, w/ SCr rising to 2.3. Echo repeated. LVEF 55-60%. RV severely enlarged w/ severely reduced RV systolic function. Pulmonary artery systolic pressure severely elevated at 76 mmhg. There is mild TR. Also mild to moderate aortic valve sclerosis/calcification is present, without any evidence of aortic stenosis. He was subsequently transferred to [facility] for further management of his HF w/ formal RHC and AHF consultation.  On arrival to [facility], He was placed on high dose IV Lasix w/ better urinary response/ diuresis. RHC 4/8 with elevated filling pressures, mixed pulmonary venous/pulmonary hypertension. V/Q scan negative for chronic PE. He was continued on IV diuretics and diuresed > 20 lb and placed on PO torsemide 40 mg bid.  SCr improved w/ diuresis. Also started on trial of sildenafil 20 mg tid. Advised to continue supplemental home O2 and to restart regular use of CPAP. Also of note, a multiple myeloma panel was done given MGUS and showed elevated M spike protein. Urine immunofixation and UPEP both unremarkable. Discharge wt was 261 lb.   Patient was admitted in 6/22 with syncope.  He was at his PCP's office, stood up, and passed out.  AED was applied, no shock. CPR was done with ROSC < 1 minute. Echo showed EF 50-55%, mild LVH, D-shaped septum, severe RV enlargement with severely decreased RV systolic function, PASP 90, IVC dilated.  He was discharged to a rehab facility in  [place]. He was supposed to get a Zio monitor at discharge but this was not done.  Creatinine was elevated to 2.5 during this hospitalization.   In 10/22, he was admitted with GI bleeding/hematochezia.  He had large, oozing rectal polyps with high grade dysplasia.    Admitted to [facility] 11/22/20 with A/C HFpEF. Failed escalating diuretic regimen at home. Weight was up 30 pounds from August. [place] abd with moderate ascites.  Started on IV lasix. Echo this admit EF 55-60% and severe RV dysfunction + dilated IVC. Sluggish diuresis noted. Had Paracentesis with 2.4 liters removed. Started on milrinone and Lasix gtt. Nephrology consulted and patient required transfer to ICU for CRRT. He remained in afib but rate controlled on amiodarone gtt. Able to wean gtts and transition to iHD T/Th/Sat, 1st session 12/05/20. Seen by palliative care, patient elected to be full code. He was discharged back to [facility], weight 229 lbs.  Today he returns for post hospital HF follow up. Overall feeling fine. He is now at [facility]. He is able to walk short distances with a walker to does not have significant dyspnea with this. He makes very little urine. Had a UTI with hematuria recently, given abx and hematuria has since cleared up. PCP referred him to Urology. Denies palpitations, CP, dizziness, edema, or PND/Orthopnea. Appetite ok. No fever or chills. Taking all medications provided by facility. He is now T/TH/Sat on dialysis, has not had any low BP at dialysis. He continuously wears 2L oxygen, not wearing CPAP at night.  Labs (6/22): K 4.8, creatinine 2.5 Labs (7/22): K 3.8, creatinine 2.34 Labs (11/22): ANA 1:80, K 4.1, creatinine 2.22, LFTs normal, hgb 9 Labs (12/22): Hgb 10.7  ROS: All systems reviewed and negative except as per HPI.   PMH: 1. Chronic atrial fibrillation.  - Zio monitor (9/22): atrial fibrillation, average rate 81 bpm.  4% PVCs, 2 short NSVT runs.  2. Chronic systolic => diastolic CHF with prominent RV failure: LHC 2008 with no significant CAD but EF 25%.  Echo (2/16) with EF 35-40% (?tachy-mediated CMP).  Echo (5/16) with EF 65-60%, mild MR, mild RV dilation with mildly decreased RV systolic function.  - RHC (4/21): mean RA 16, PA 95/32 mean 53, mean PCWP 23, CI 2.71, PVR 5 WU.  - echo (4/21): EF 55-60%, severe RV dilation and severe RV dysfunction, PASP 76 mmHg.  - PYP scan (5/21): grade 1, H/CL 0.9 (likely  negative).  - Echo (6/22): EF 50-55%, mild LVH, D-shaped septum, severe RV enlargement with severely decreased RV systolic function, PASP 90, IVC dilated. 3. OHS/OSA: Uses CPAP at night, oxygen during the day.  4. Colon cancer s/p resection 5. Type II diabetes 6. CKD stage 3: With episodes of AKI. 7. Hyperlipidemia 8. HTN 9. Pulmonary hypertension: Suspect group 3 PH.  - RHC (4/21): mean RA 16, PA 95/32 mean 53, mean PCWP 23, CI 2.71, PVR 5 WU.  - echo (4/21): EF 55-60%, severe RV dilation and severe RV dysfunction, PASP 76 mmHg.  - V/Q scan 4/21 was negative.  10. H/o GI bleeding.  - 10/22 GI bleeding from large rectal polyps, high grade dysplasia noted.  11. Syncope: 6/22.  12. Cirrhosis: By imaging.  With splenomegaly and ascites.   Current Outpatient Medications  Medication Sig Dispense Refill   allopurinol (ZYLOPRIM) 300 MG tablet Take 1 tablet (300 mg total) by mouth daily. 90 tablet 3   acetaminophen (TYLENOL) 325 MG tablet Take 2 tablets (650 mg total) by mouth every  6 (six) hours as needed for mild pain (or Fever >/= 101). 12 tablet 0   apixaban (ELIQUIS) 5 MG TABS tablet Take 1 tablet (5 mg total) by mouth 2 (two) times daily. 60 tablet    atorvastatin (LIPITOR) 20 MG tablet TAKE 1 TABLET BY MOUTH EVERY DAY 90 tablet 3   B Complex-C (B-COMPLEX WITH VITAMIN C) tablet Take 1 tablet by mouth daily.     benzonatate (TESSALON) 100 MG capsule Take 1 capsule (100 mg total) by mouth 2 (two) times daily as needed for cough.     ferrous sulfate 325 (65 FE) MG tablet Take 325 mg by mouth daily with breakfast.      glucose blood (CONTOUR NEXT TEST) test strip Test BS TID Dx E11.29 300 strip 3   insulin aspart (NOVOLOG) 100 UNIT/ML injection Before each meal 3 times a day, 140-199 - 2 units, 200-250 - 4 units, 251-299 - 6 units,  300-349 - 8 units,  350 or above 10 units. Insulin PEN if approved, provide syringes and needles if needed. 10 mL 0   insulin glargine-yfgn (SEMGLEE) 100 UNIT/ML  Pen Inject 16 Units into the skin at bedtime.     Insulin Pen Needle 32G X 4 MM MISC Use to give insulin daily Dx E11.29 100 each 3   lactulose (CHRONULAC) 10 GM/15ML solution Take 30 mLs (20 g total) by mouth 2 (two) times daily. 236 mL 0   melatonin 5 MG TABS Take 5 mg by mouth at bedtime as needed.     metoprolol succinate (TOPROL-XL) 25 MG 24 hr tablet Take 1 tablet (25 mg total) by mouth daily.     midodrine (PROAMATINE) 5 MG tablet Take 3 tablets (15 mg total) by mouth 3 (three) times daily with meals.     oxyCODONE (OXY IR/ROXICODONE) 5 MG immediate release tablet Take 1 tablet (5 mg total) by mouth every 6 (six) hours as needed for moderate pain or severe pain. 5 tablet 0   OXYGEN Inhale into the lungs. Continuous     pantoprazole (PROTONIX) 40 MG tablet TAKE 1 TABLET BY MOUTH EVERY DAY 90 tablet 1   polyethylene glycol (MIRALAX / GLYCOLAX) 17 g packet Take 17 g by mouth daily. 14 each 0   sildenafil (REVATIO) 20 MG tablet Take 1 tablet (20 mg total) by mouth 3 (three) times daily. 90 tablet 11   Zinc Oxide (TRIPLE PASTE) 12.8 % ointment Apply topically 2 (two) times daily. 56.7 g 0   No current facility-administered medications for this encounter.   Allergies  Allergen Reactions   Codeine     Headache    Social History   Socioeconomic History   Marital status: Divorced    Spouse name: Not on file   Number of children: 1   Years of education: 39   Highest education level: 12th grade  Occupational History    Employer: [employer]  Tobacco Use   Smoking status: Former    Types: Cigarettes    Quit date: 08/06/1983    Years since quitting: 37.4   Smokeless tobacco: Never  Vaping Use   Vaping Use: Never used  Substance and Sexual Activity   Alcohol use: No   Drug use: No   Sexual activity: Not Currently  Other Topics Concern   Not on file  Social History Narrative   Lives alone   Social Determinants of Health   Financial Resource Strain: Low Risk     Difficulty of Paying Living Expenses: Not  very hard  Food Insecurity: No Food Insecurity   Worried About Charity fundraiser in the Last Year: Never true   Ran Out of Food in the Last Year: Never true  Transportation Needs: No Transportation Needs   Lack of Transportation (Medical): No   Lack of Transportation (Non-Medical): No  Physical Activity: Inactive   Days of Exercise per Week: 0 days   Minutes of Exercise per Session: 0 min  Stress: Not on file  Social Connections: Socially Isolated   Frequency of Communication with Friends and Family: More than three times a week   Frequency of Social Gatherings with Friends and Family: More than three times a week   Attends Religious Services: Never   Marine scientist or Organizations: No   Attends Archivist Meetings: Never   Marital Status: Divorced  Human resources officer Violence: Not on file   Family History  Problem Relation Age of Onset   Breast cancer Mother    Diabetes Mother    Heart attack Father    BP (!) 144/70    Pulse 92    Wt 109 kg (240 lb 6.4 oz)    SpO2 99%    BMI 37.65 kg/m   Wt Readings from Last 3 Encounters:  01/25/21 109 kg (240 lb 6.4 oz)  12/10/20 104 kg (229 lb 4.5 oz)  11/20/20 130.2 kg (287 lb)   PHYSICAL EXAM: General:  NAD. No resp difficulty, arrived in [facility] on oxygen, chronically-ill appearing. HEENT: Normal Neck: Supple. No JVD. Carotids 2+ bilat; no bruits. No lymphadenopathy or thryomegaly appreciated. Cor: PMI nondisplaced. Irregular rate & rhythm. No rubs, gallops or murmurs. Lungs: Clear, right TDC looks ok Abdomen: Obese, nontender, nondistended. No hepatosplenomegaly. No bruits or masses. Good bowel sounds. Extremities: No cyanosis, clubbing, rash, edema Neuro: Alert & oriented x 3, cranial nerves grossly intact. Moves all 4 extremities w/o difficulty. Affect pleasant.  ASSESSMENT & PLAN: 1. Cor pulmonale/RV failure: Suspect primarily due to OHS/OSA. Echo in 4/21 with EF 55-60%,  RV severely dilated w/ severe RV dysfunction in the setting of severe pulmonary hypertension w/ PASP 76 mmHg. [place] 4/21 with elevated filling pressures, mixed pulmonary venous/pulmonary hypertension=> diuresed w/ IV Lasix in hospital and placed on po torsemide. Echo in 11/22 showed EF 55-60%, mod LVH, D-shaped septum, severe RV enlargement with severely decreased RV systolic function, IVC dilated. NYHA class IIIb, functional class limited by physical deconditioning. Volume per iHD/nephrology.  - Stop midodrine. Can add back on dialysis days if BP drops during dialysis sessions. 2. Pulmonary HTN: PASP elevated at 76 mmHg by 4/21 echo estimate with RV systolic function severely reduced. Most likely poorly controlled OHS/OSA as cause (group 3 PH).  V/Q scan negative for chronic PE.  Mixed PVH/PAH on RHC.  Echo in 10/22 with severe RV failure and again this past admission.   - Continue sildenafil 20 mg tid.  - Continue home oxygen and encouraged compliance with CPAP at night.    3. ESRD: Now HD dependent.  - Planning to convert from temp HD cath to tunneled HD cath w/ IR  4. Atrial fibrillation: Chronic, unlikely to be able to successfully cardiovert given long-standing atrial fibrillation. Now off amio. ECG shows atrial fibrillation, rate controlled. - Continue Toprol XL 25 mg daily.  - Continue Eliquis 5 bid. 5. PVCs:  Continue Toprol XL.  6. OSA/OHS: Continue CPAP + oxygen.  Encouraged nightly CPAP use. 7. Diabetes: On insulin. 8. MGUS: Previously followed by hem/onc but has  not seen in several years.  Multiple myeloma panel 4/21 with presence of M-spike. 9. Cirrhosis: ? Due to NAFLD.  11/23 Ammonia 48. Paracentesis 2.4 L this past admission. Mental status good today. - Continue lactulose.  10. Anemia: last Hgb 10.7. Has had IV Fe.   - Request CBC from [facility] in [place]. 11. H/o Encephalopathy: Suspect CO2 narcosis + possible component hepatic encephalopathy with elevated NH3.  Alert/oriented today.  Remains on lactulose.  12. Thrombocytopenia: In setting of acute inflammatory illness and possibly splenic sequestration with cirrhosis.  - Request CBC from [facility] in [place]. 44. GOC: Lives in SNF. Now with end stage RV failure & ESRD requiring iHD. Palliative care saw inpatient and he elected to remain full code. Seems to be doing ok now.  Follow up with Dr. Aundra Dubin in 3 months.  Allena Katz, FNP-BC 01/25/21

## 2021-01-26 DIAGNOSIS — D509 Iron deficiency anemia, unspecified: Secondary | ICD-10-CM | POA: Diagnosis not present

## 2021-01-26 DIAGNOSIS — N186 End stage renal disease: Secondary | ICD-10-CM | POA: Diagnosis not present

## 2021-01-26 DIAGNOSIS — D631 Anemia in chronic kidney disease: Secondary | ICD-10-CM | POA: Diagnosis not present

## 2021-01-26 DIAGNOSIS — N179 Acute kidney failure, unspecified: Secondary | ICD-10-CM | POA: Diagnosis not present

## 2021-01-26 DIAGNOSIS — Z992 Dependence on renal dialysis: Secondary | ICD-10-CM | POA: Diagnosis not present

## 2021-01-28 DIAGNOSIS — N186 End stage renal disease: Secondary | ICD-10-CM | POA: Diagnosis not present

## 2021-01-28 DIAGNOSIS — D631 Anemia in chronic kidney disease: Secondary | ICD-10-CM | POA: Diagnosis not present

## 2021-01-28 DIAGNOSIS — D509 Iron deficiency anemia, unspecified: Secondary | ICD-10-CM | POA: Diagnosis not present

## 2021-01-28 DIAGNOSIS — N179 Acute kidney failure, unspecified: Secondary | ICD-10-CM | POA: Diagnosis not present

## 2021-01-28 DIAGNOSIS — Z992 Dependence on renal dialysis: Secondary | ICD-10-CM | POA: Diagnosis not present

## 2021-01-30 DIAGNOSIS — D631 Anemia in chronic kidney disease: Secondary | ICD-10-CM | POA: Diagnosis not present

## 2021-01-30 DIAGNOSIS — N186 End stage renal disease: Secondary | ICD-10-CM | POA: Diagnosis not present

## 2021-01-30 DIAGNOSIS — D509 Iron deficiency anemia, unspecified: Secondary | ICD-10-CM | POA: Diagnosis not present

## 2021-01-30 DIAGNOSIS — N179 Acute kidney failure, unspecified: Secondary | ICD-10-CM | POA: Diagnosis not present

## 2021-01-30 DIAGNOSIS — Z992 Dependence on renal dialysis: Secondary | ICD-10-CM | POA: Diagnosis not present

## 2021-02-01 ENCOUNTER — Encounter: Payer: Self-pay | Admitting: *Deleted

## 2021-02-02 ENCOUNTER — Encounter: Payer: Self-pay | Admitting: Urology

## 2021-02-02 ENCOUNTER — Other Ambulatory Visit: Payer: Self-pay

## 2021-02-02 ENCOUNTER — Ambulatory Visit (INDEPENDENT_AMBULATORY_CARE_PROVIDER_SITE_OTHER): Payer: Medicare Other | Admitting: Urology

## 2021-02-02 VITALS — BP 119/76 | HR 99

## 2021-02-02 DIAGNOSIS — N481 Balanitis: Secondary | ICD-10-CM | POA: Diagnosis not present

## 2021-02-02 DIAGNOSIS — R310 Gross hematuria: Secondary | ICD-10-CM | POA: Diagnosis not present

## 2021-02-02 DIAGNOSIS — N179 Acute kidney failure, unspecified: Secondary | ICD-10-CM | POA: Diagnosis not present

## 2021-02-02 DIAGNOSIS — Z992 Dependence on renal dialysis: Secondary | ICD-10-CM

## 2021-02-02 DIAGNOSIS — D509 Iron deficiency anemia, unspecified: Secondary | ICD-10-CM | POA: Diagnosis not present

## 2021-02-02 DIAGNOSIS — N186 End stage renal disease: Secondary | ICD-10-CM

## 2021-02-02 DIAGNOSIS — D631 Anemia in chronic kidney disease: Secondary | ICD-10-CM | POA: Diagnosis not present

## 2021-02-02 NOTE — Progress Notes (Signed)
Urological Symptom Review  Patient is experiencing the following symptoms: none   Review of Systems  Gastrointestinal (upper)  : Negative for upper GI symptoms  Gastrointestinal (lower) : Negative for lower GI symptoms  Constitutional : Negative for symptoms  Skin: Negative for skin symptoms  Eyes: Negative for eye symptoms  Ear/Nose/Throat : Sinus problems  Hematologic/Lymphatic: Easy bruising (blood thinners)  Cardiovascular : Negative for cardiovascular symptoms  Respiratory : Cough  Endocrine: Negative for endocrine symptoms  Musculoskeletal: Back pain Joint pain  Neurological: Negative for neurological symptoms  Psychologic: Negative for psychiatric symptoms

## 2021-02-02 NOTE — Progress Notes (Signed)
Assessment: 1. Gross hematuria   2. Balanitis   3. ESRD (end stage renal disease) on dialysis [facility])      Plan: By history, his episode of hematuria appears to be related to a UTI.  His symptoms have resolved.  Unfortunately, no lab results are available for review and he is unable to provide a urine specimen today due to his recent dialysis. Request urinalysis and urine culture results from [facility] [place] ur[facility]ysis and fax results to [phone]. Recommend nystatin cream to penis twice daily for balanitis. Return to office in 1 month.  Chief Complaint:  Chief Complaint  Patient presents with   Hematuria    History of Present Illness:  William Flores is a 71 y.o. year old male who is seen in consultation from [name], [name], MD for evaluation of gross hematuria.  The patient has multiple medical problems and is currently on hemodialysis for end-stage renal disease.  He had an episode of gross hematuria approximately 2 weeks ago.  This was associated with dysuria.  No flank or abdominal pain.  No lab results available for review.  He reports that he was told he had a UTI and treated with antibiotics.  The gross hematuria resolved.  He is not having any dysuria or other urinary symptoms at the present time.  He voids approximately twice per day while on hemodialysis.  No history of UTIs or kidney stones.    He does have a remote history of tobacco use but has not smoked for over 30 years. CT imaging from 10/22 showed no renal or ureteral calculi and no evidence of obstruction.  IPSS = 4 today.  Past Medical History:  Past Medical History:  Diagnosis Date   CAD (coronary artery disease)    LHC 2/08:  mRCA 50%, o/w no sig CAD, EF 25%   Cardiomyopathy ([place])    EF 35-40% in 2/16 in setting of AF with RVR >> echo 5/16 with improved LVF with EF 65-70%   Chronic atrial fibrillation (HCC)    Chronic diastolic heart failure (HCC)    HFPF - EF  ~65-70% (OFTEN EXACERBATED BY AFIB)   CKD (chronic kidney disease)    hx of worsening renal failure and hyperK+ in setting of acute diast CHF >> required CVVHD in 4/16 and dialysis x 1 in 5/16   CKD stage 3 due to type 2 diabetes mellitus ([place]) 09/17/2015   Colon cancer ([place])    Colon polyps 09/21/2012   Tubular adenoma   Diabetes ([place])    Hyperlipidemia    Hypertension    Obesity hypoventilation syndrome ([place])    Renal insufficiency    Sleep apnea     Past Surgical History:  Past Surgical History:  Procedure Laterality Date   CIRCUMCISION     COLON RESECTION     COLONOSCOPY N/A 09/21/2012   Procedure: COLONOSCOPY;  Surgeon: Inda Castle, MD;  Location: [facility] ENDOSCOPY;  Service: Endoscopy;  Laterality: N/A;   COLONOSCOPY N/A 12/28/2018   Procedure: COLONOSCOPY;  Surgeon: Rogene [place], MD;  Location: [facility] ENDO SUITE;  Service: Endoscopy;  Laterality: N/A;   COLONOSCOPY WITH PROPOFOL N/A 10/17/2020   Procedure: COLONOSCOPY WITH PROPOFOL;  Surgeon: Harvel Quale, MD;  Location: [facility] ENDO SUITE;  Service: Gastroenterology;  Laterality: N/A;   ENTEROSCOPY N/A 06/24/2020   Procedure: ENTEROSCOPY;  Surgeon: Gatha Mayer, MD;  Location: [facility] ENDOSCOPY;  Service: Endoscopy;  Laterality: N/A;   ESOPHAGOGASTRODUODENOSCOPY N/A 12/28/2018  Procedure: ESOPHAGOGASTRODUODENOSCOPY (EGD);  Surgeon: Rogene [place], MD;  Location: [facility];  Service: Endoscopy;  Laterality: N/A;   IR FLUORO GUIDE CV LINE RIGHT  12/10/2020   IR US GUIDE VASC ACCESS RIGHT  12/11/2020   POLYPECTOMY  10/17/2020   Procedure: POLYPECTOMY;  Surgeon: Harvel Quale, MD;  Location: [facility];  Service: Gastroenterology;;  rectal, transverse, descending, ascending, sigmoid   RIGHT HEART CATH N/A 04/11/2019   Procedure: RIGHT HEART CATH;  Surgeon: Larey Dresser, MD;  Location: [place] CV LAB;  Service: Cardiovascular;  Laterality: N/A;   US ECHOCARDIOGRAPHY  03/04/2010   abnormal study     Allergies:  Allergies  Allergen Reactions   Codeine     Headache     Family History:  Family History  Problem Relation Age of Onset   Breast cancer Mother    Diabetes Mother    Heart attack Father     Social History:  Social History   Tobacco Use   Smoking status: Former    Types: Cigarettes    Quit date: 08/06/1983    Years since quitting: 37.5   Smokeless tobacco: Never  Vaping Use   Vaping Use: Never used  Substance Use Topics   Alcohol use: No   Drug use: No    Review of symptoms:  Constitutional:  Negative for unexplained weight loss, night sweats, fever, chills ENT:  Negative for nose bleeds, sinus pain, painful swallowing CV:  Negative for chest pain, shortness of breath, exercise intolerance, palpitations, loss of consciousness Resp:  Negative for cough, wheezing, shortness of breath GI:  Negative for nausea, vomiting, diarrhea, bloody stools GU:  Positives noted in HPI; otherwise negative for urinary incontinence Neuro:  Negative for seizures, poor balance, limb weakness, slurred speech Psych:  Negative for lack of energy, depression, anxiety Endocrine:  Negative for polydipsia, polyuria, symptoms of hypoglycemia (dizziness, hunger, sweating) Hematologic:  Negative for anemia, purpura, petechia, prolonged or excessive bleeding, use of anticoagulants  Allergic:  Negative for difficulty breathing or choking as a result of exposure to anything; no shellfish allergy; no allergic response (rash/itch) to materials, foods  Physical exam: BP 119/76    Pulse 99  GENERAL APPEARANCE:  Well appearing, well developed, well nourished, NAD HEENT: Atraumatic, Normocephalic, oropharynx clear. NECK: Supple without lymphadenopathy or thyromegaly. LUNGS: Clear to auscultation bilaterally. HEART: Regular Rate and Rhythm without murmurs, gallops, or rubs. ABDOMEN: Soft, non-tender, No Masses. EXTREMITIES: Moves all extremities well.  Without clubbing, cyanosis, or  edema. NEUROLOGIC:  Alert and oriented x 3, in wheelchair, CN II-XII grossly intact.  MENTAL STATUS:  Appropriate. BACK:  Non-tender to palpation.  No CVAT SKIN:  Warm, dry and intact.   GU: Penis:  uncircumcised; erythema consistent with balanitis Meatus: normal Scrotum: normal, no masses Testis: normal without masses bilateral Prostate: 40 g, NT, no nodules Rectum: Normal tone,  no masses or tenderness   Results: No specimen provided

## 2021-02-03 DIAGNOSIS — I5033 Acute on chronic diastolic (congestive) heart failure: Secondary | ICD-10-CM | POA: Diagnosis not present

## 2021-02-03 DIAGNOSIS — R262 Difficulty in walking, not elsewhere classified: Secondary | ICD-10-CM | POA: Diagnosis not present

## 2021-02-03 DIAGNOSIS — N1832 Chronic kidney disease, stage 3b: Secondary | ICD-10-CM | POA: Diagnosis not present

## 2021-02-03 DIAGNOSIS — N186 End stage renal disease: Secondary | ICD-10-CM | POA: Diagnosis not present

## 2021-02-03 DIAGNOSIS — Z992 Dependence on renal dialysis: Secondary | ICD-10-CM | POA: Diagnosis not present

## 2021-02-04 DIAGNOSIS — D509 Iron deficiency anemia, unspecified: Secondary | ICD-10-CM | POA: Diagnosis not present

## 2021-02-04 DIAGNOSIS — R262 Difficulty in walking, not elsewhere classified: Secondary | ICD-10-CM | POA: Diagnosis not present

## 2021-02-04 DIAGNOSIS — Z992 Dependence on renal dialysis: Secondary | ICD-10-CM | POA: Diagnosis not present

## 2021-02-04 DIAGNOSIS — R319 Hematuria, unspecified: Secondary | ICD-10-CM | POA: Diagnosis not present

## 2021-02-04 DIAGNOSIS — R300 Dysuria: Secondary | ICD-10-CM | POA: Diagnosis not present

## 2021-02-04 DIAGNOSIS — D631 Anemia in chronic kidney disease: Secondary | ICD-10-CM | POA: Diagnosis not present

## 2021-02-04 DIAGNOSIS — N481 Balanitis: Secondary | ICD-10-CM | POA: Diagnosis not present

## 2021-02-04 DIAGNOSIS — N186 End stage renal disease: Secondary | ICD-10-CM | POA: Diagnosis not present

## 2021-02-05 DIAGNOSIS — R262 Difficulty in walking, not elsewhere classified: Secondary | ICD-10-CM | POA: Diagnosis not present

## 2021-02-06 DIAGNOSIS — D631 Anemia in chronic kidney disease: Secondary | ICD-10-CM | POA: Diagnosis not present

## 2021-02-06 DIAGNOSIS — N186 End stage renal disease: Secondary | ICD-10-CM | POA: Diagnosis not present

## 2021-02-06 DIAGNOSIS — Z992 Dependence on renal dialysis: Secondary | ICD-10-CM | POA: Diagnosis not present

## 2021-02-06 DIAGNOSIS — D509 Iron deficiency anemia, unspecified: Secondary | ICD-10-CM | POA: Diagnosis not present

## 2021-02-09 DIAGNOSIS — D631 Anemia in chronic kidney disease: Secondary | ICD-10-CM | POA: Diagnosis not present

## 2021-02-09 DIAGNOSIS — N186 End stage renal disease: Secondary | ICD-10-CM | POA: Diagnosis not present

## 2021-02-09 DIAGNOSIS — D509 Iron deficiency anemia, unspecified: Secondary | ICD-10-CM | POA: Diagnosis not present

## 2021-02-09 DIAGNOSIS — N390 Urinary tract infection, site not specified: Secondary | ICD-10-CM | POA: Diagnosis not present

## 2021-02-09 DIAGNOSIS — Z992 Dependence on renal dialysis: Secondary | ICD-10-CM | POA: Diagnosis not present

## 2021-02-11 DIAGNOSIS — N390 Urinary tract infection, site not specified: Secondary | ICD-10-CM | POA: Diagnosis not present

## 2021-02-11 DIAGNOSIS — Z992 Dependence on renal dialysis: Secondary | ICD-10-CM | POA: Diagnosis not present

## 2021-02-11 DIAGNOSIS — D631 Anemia in chronic kidney disease: Secondary | ICD-10-CM | POA: Diagnosis not present

## 2021-02-11 DIAGNOSIS — D509 Iron deficiency anemia, unspecified: Secondary | ICD-10-CM | POA: Diagnosis not present

## 2021-02-11 DIAGNOSIS — N186 End stage renal disease: Secondary | ICD-10-CM | POA: Diagnosis not present

## 2021-02-13 DIAGNOSIS — Z992 Dependence on renal dialysis: Secondary | ICD-10-CM | POA: Diagnosis not present

## 2021-02-13 DIAGNOSIS — N186 End stage renal disease: Secondary | ICD-10-CM | POA: Diagnosis not present

## 2021-02-13 DIAGNOSIS — D631 Anemia in chronic kidney disease: Secondary | ICD-10-CM | POA: Diagnosis not present

## 2021-02-13 DIAGNOSIS — D509 Iron deficiency anemia, unspecified: Secondary | ICD-10-CM | POA: Diagnosis not present

## 2021-02-15 ENCOUNTER — Other Ambulatory Visit (HOSPITAL_COMMUNITY): Payer: Self-pay | Admitting: Cardiology

## 2021-02-16 DIAGNOSIS — N186 End stage renal disease: Secondary | ICD-10-CM | POA: Diagnosis not present

## 2021-02-16 DIAGNOSIS — D631 Anemia in chronic kidney disease: Secondary | ICD-10-CM | POA: Diagnosis not present

## 2021-02-16 DIAGNOSIS — Z992 Dependence on renal dialysis: Secondary | ICD-10-CM | POA: Diagnosis not present

## 2021-02-16 DIAGNOSIS — D509 Iron deficiency anemia, unspecified: Secondary | ICD-10-CM | POA: Diagnosis not present

## 2021-02-17 ENCOUNTER — Ambulatory Visit (INDEPENDENT_AMBULATORY_CARE_PROVIDER_SITE_OTHER): Payer: Medicare Other | Admitting: Vascular Surgery

## 2021-02-17 ENCOUNTER — Other Ambulatory Visit: Payer: Self-pay

## 2021-02-17 ENCOUNTER — Encounter: Payer: Self-pay | Admitting: Vascular Surgery

## 2021-02-17 VITALS — BP 113/73 | HR 82 | Temp 97.7°F | Resp 18 | Ht 64.0 in | Wt 238.6 lb

## 2021-02-17 DIAGNOSIS — N186 End stage renal disease: Secondary | ICD-10-CM | POA: Diagnosis not present

## 2021-02-17 DIAGNOSIS — Z992 Dependence on renal dialysis: Secondary | ICD-10-CM | POA: Diagnosis not present

## 2021-02-17 NOTE — H&P (View-Only) (Signed)
Vascular and Vein Specialist of [place]  Patient name: William Flores MRN: [id number] DOB: 11-19-1950 Sex: male  REASON FOR CONSULT: Patient presents today for discussion of access for hemodialysis.  He had acute renal failure with multiple medical difficulties.  Does have cardiomyopathy with an ejection fraction in the 35 to 40% range.  He is in chronic atrial fibrillation on Coumadin.  He has long history of diabetes and hypertension.  He had placement of a tunneled hemodialysis catheter through interventional radiology at [facility] on 12/10/2020 and has been initiated on dialysis at the [facility] dialysis center.  He has had no difficulty with his catheter.  He is seen today for discussion of long-term access.  He is left-handed.  He does not have a pacemaker.  He is on chronic Coumadin therapy.    Past Medical History:  Diagnosis Date   CAD (coronary artery disease)    LHC 2/08:  mRCA 50%, o/w no sig CAD, EF 25%   Cardiomyopathy ([place] [facility])    EF 35-40% in 2/16 in setting of AF with RVR >> echo 5/16 with improved LVF with EF 65-70%   Chronic atrial fibrillation (HCC)    Chronic diastolic heart failure (HCC)    HFPF - EF ~65-70% (OFTEN EXACERBATED BY AFIB)   CKD (chronic kidney disease)    hx of worsening renal failure and hyperK+ in setting of acute diast CHF >> required CVVHD in 4/16 and dialysis x 1 in 5/16   CKD stage 3 due to type 2 diabetes mellitus ([place]) 09/17/2015   Colon cancer ([place])    Colon polyps 09/21/2012   Tubular adenoma   Diabetes (HCC)    Hyperlipidemia    Hypertension    Obesity hypoventilation syndrome (HCC)    Renal insufficiency    Sleep apnea     Family History  Problem Relation Age of Onset   Breast cancer Mother    Diabetes Mother    Heart attack Father     SOCIAL HISTORY: Social History   Socioeconomic History   Marital status: Divorced    Spouse name: Not on file   Number of children: 1   Years of  education: 12   Highest education level: 12th grade  Occupational History    Employer: [employer]  Tobacco Use   Smoking status: Former    Types: Cigarettes    Quit date: 08/06/1983    Years since quitting: 37.5   Smokeless tobacco: Never  Vaping Use   Vaping Use: Never used  Substance and Sexual Activity   Alcohol use: No   Drug use: No   Sexual activity: Not Currently  Other Topics Concern   Not on file  Social History Narrative   Lives alone   Social Determinants of Health   Financial Resource Strain: Low Risk    Difficulty of Paying Living Expenses: Not very hard  Food Insecurity: No Food Insecurity   Worried About Running Out of Food in the Last Year: Never true   Ran Out of Food in the Last Year: Never true  Transportation Needs: No Transportation Needs   Lack of Transportation (Medical): No   Lack of Transportation (Non-Medical): No  Physical Activity: Inactive   Days of Exercise per Week: 0 days   Minutes of Exercise per Session: 0 min  Stress: Not on file  Social Connections: Socially Isolated   Frequency of Communication with Friends and Family: More than three times a week   Frequency of Social  Gatherings with Friends and Family: More than three times a week   Attends Religious Services: Never   Marine scientist or Organizations: No   Attends Archivist Meetings: Never   Marital Status: Divorced  Human resources officer Violence: Not on file    Allergies  Allergen Reactions   Codeine     Headache     Current Outpatient Medications  Medication Sig Dispense Refill   acetaminophen (TYLENOL) 160 MG/5ML liquid Take 650 mg by mouth every 4 (four) hours as needed for fever.     allopurinol (ZYLOPRIM) 100 MG tablet Give 2 tablet by mouth one time a day for gout.     apixaban (ELIQUIS) 5 MG TABS tablet Take 1 tablet (5 mg total) by mouth 2 (two) times daily. 60 tablet    atorvastatin (LIPITOR) 20 MG tablet TAKE 1 TABLET BY MOUTH EVERY DAY  (Patient taking differently: TAKE 1 TABLET BY MOUTH EVERY DAY AT BED TIME) 90 tablet 3   B Complex-C (B-COMPLEX WITH VITAMIN C) tablet Take 1 tablet by mouth daily.     benzonatate (TESSALON) 100 MG capsule Take 1 capsule (100 mg total) by mouth 2 (two) times daily as needed for cough.     epoetin alfa (EPOGEN) 2000 UNIT/ML injection 2,000 Units 3 (three) times a week. Tuesday thursday saturday     ferrous sulfate 325 (65 FE) MG tablet Take 325 mg by mouth daily with breakfast.      glucose blood (CONTOUR NEXT TEST) test strip Test BS TID Dx E11.29 300 strip 3   insulin aspart (NOVOLOG) 100 UNIT/ML injection Before each meal 3 times a day, 140-199 - 2 units, 200-250 - 4 units, 251-299 - 6 units,  300-349 - 8 units,  350 or above 10 units. Insulin PEN if approved, provide syringes and needles if needed. 10 mL 0   insulin glargine-yfgn (SEMGLEE) 100 UNIT/ML Pen Inject 16 Units into the skin at bedtime.     Insulin Pen Needle 32G X 4 MM MISC Use to give insulin daily Dx E11.29 100 each 3   lactulose (CHRONULAC) 10 GM/15ML solution Take 30 mLs (20 g total) by mouth 2 (two) times daily. 236 mL 0   levothyroxine (SYNTHROID) 100 MCG tablet Take 100 mcg by mouth every morning.     melatonin 5 MG TABS Take 5 mg by mouth at bedtime as needed.     metoprolol succinate (TOPROL-XL) 50 MG 24 hr tablet Take by mouth.     oxyCODONE (OXY IR/ROXICODONE) 5 MG immediate release tablet Take 1 tablet (5 mg total) by mouth every 6 (six) hours as needed for moderate pain or severe pain. 5 tablet 0   OXYGEN Inhale 2 L into the lungs. Continuous     pantoprazole (PROTONIX) 40 MG tablet TAKE 1 TABLET BY MOUTH EVERY DAY 90 tablet 1   polyethylene glycol (MIRALAX / GLYCOLAX) 17 g packet Take 17 g by mouth daily. 14 each 0   potassium chloride SA (KLOR-CON M) 20 MEQ tablet Take 20 mEq by mouth daily.     sildenafil (REVATIO) 20 MG tablet Take 1 tablet (20 mg total) by mouth 3 (three) times daily. 90 tablet 11   torsemide  (DEMADEX) 20 MG tablet Take by mouth.     Zinc Oxide (TRIPLE PASTE) 12.8 % ointment Apply topically 2 (two) times daily. 56.7 g 0   No current facility-administered medications for this visit.    REVIEW OF SYSTEMS:  [X]  denotes positive finding, [ ]   denotes negative finding Cardiac  Comments:  Chest pain or chest pressure:    Shortness of breath upon exertion: x   Short of breath when lying flat:    Irregular heart rhythm: x       Vascular    Pain in calf, thigh, or hip brought on by ambulation:    Pain in feet at night that wakes you up from your sleep:     Blood clot in your veins:    Leg swelling:  x       Pulmonary    Oxygen at home:    Productive cough:     Wheezing:         Neurologic    Sudden weakness in arms or legs:     Sudden numbness in arms or legs:     Sudden onset of difficulty speaking or slurred speech:    Temporary loss of vision in one eye:     Problems with dizziness:         Gastrointestinal    Blood in stool:     Vomited blood:         Genitourinary    Burning when urinating:     Blood in urine:        Psychiatric    Major depression:         Hematologic    Bleeding problems:    Problems with blood clotting too easily:        Skin    Rashes or ulcers:        Constitutional    Fever or chills:      PHYSICAL EXAM: Vitals:   02/17/21 0945  BP: 113/73  Pulse: 82  Resp: 18  Temp: 97.7 F (36.5 C)  TempSrc: Temporal  SpO2: 97%  Weight: 238 lb 9.6 oz (108.2 kg)  Height: 5' 4"  (1.626 m)    GENERAL: The patient is a well-nourished male, in no acute distress. The vital signs are documented above.  Patient is wheelchair-bound CARDIOVASCULAR: 2+ brachial pulses bilaterally.  Faint radial pulse in the left and 2+ radial pulse on the right.  He does have small to moderate cephalic vein in the forearm bilaterally. PULMONARY: There is good air exchange  MUSCULOSKELETAL: There are no major deformities or cyanosis. NEUROLOGIC: No focal  weakness or paresthesias are detected. SKIN: There are no ulcers or rashes noted. PSYCHIATRIC: The patient has a normal affect.  DATA:  I imaged his bilateral upper extremity veins with SonoSite ultrasound.  He does have patency of his cephalic vein in the forearm and upper arm on the right but does relatively small throughout its course.  He does have a large basilic vein on the medial aspect of his right upper arm.  MEDICAL ISSUES: I discussed options for long-term hemodialysis with the patient.  Explained limitations of long-term use of catheter with the risk for infection and catheter-based sepsis.  Also discussed options for AV fistula and AV graft and the advantages and disadvantages of each.  I feel that his best option initially would be right arm for stage basilic vein to brachial artery fistula.  He is left-handed.  He understands that we would then see him back in the office in 4 to 6 weeks to assure that the fistula is maturing and then would have a second stage as an outpatient at [facility] for transposition.  We have scheduled initial fistula for Tuesday, 03/02/2021.  He does have dialysis on Tuesday Thursday Saturday so  we will coordinate moving his dialysis today.  Also we will coordinate holding his Coumadin for the procedure   [name], MD [facility] Vascular and Vein Specialists of [facility] [phone] Pager [phone]  Note: Portions of this report may have been transcribed using voice recognition software.  Every effort has been made to ensure accuracy; however, inadvertent computerized transcription errors may still be present.

## 2021-02-17 NOTE — Progress Notes (Signed)
Vascular and Vein Specialist of [place]  Patient name: William Flores MRN: [id number] DOB: June 19, 1950 Sex: male  REASON FOR CONSULT: Patient presents today for discussion of access for hemodialysis.  He had acute renal failure with multiple medical difficulties.  Does have cardiomyopathy with an ejection fraction in the 35 to 40% range.  He is in chronic atrial fibrillation on Coumadin.  He has long history of diabetes and hypertension.  He had placement of a tunneled hemodialysis catheter through interventional radiology at [facility] on 12/10/2020 and has been initiated on dialysis at the [facility] dialysis center.  He has had no difficulty with his catheter.  He is seen today for discussion of long-term access.  He is left-handed.  He does not have a pacemaker.  He is on chronic Coumadin therapy.    Past Medical History:  Diagnosis Date   CAD (coronary artery disease)    LHC 2/08:  mRCA 50%, o/w no sig CAD, EF 25%   Cardiomyopathy ([place])    EF 35-40% in 2/16 in setting of AF with RVR >> echo 5/16 with improved LVF with EF 65-70%   Chronic atrial fibrillation (HCC)    Chronic diastolic heart failure (HCC)    HFPF - EF ~65-70% (OFTEN EXACERBATED BY AFIB)   CKD (chronic kidney disease)    hx of worsening renal failure and hyperK+ in setting of acute diast CHF >> required CVVHD in 4/16 and dialysis x 1 in 5/16   CKD stage 3 due to type 2 diabetes mellitus ([place]) 09/17/2015   Colon cancer ([place])    Colon polyps 09/21/2012   Tubular adenoma   Diabetes (HCC)    Hyperlipidemia    Hypertension    Obesity hypoventilation syndrome (HCC)    Renal insufficiency    Sleep apnea     Family History  Problem Relation Age of Onset   Breast cancer Mother    Diabetes Mother    Heart attack Father     SOCIAL HISTORY: Social History   Socioeconomic History   Marital status: Divorced    Spouse name: Not on file   Number of children: 1   Years of  education: 12   Highest education level: 12th grade  Occupational History    Employer: [employer]  Tobacco Use   Smoking status: Former    Types: Cigarettes    Quit date: 08/06/1983    Years since quitting: 37.5   Smokeless tobacco: Never  Vaping Use   Vaping Use: Never used  Substance and Sexual Activity   Alcohol use: No   Drug use: No   Sexual activity: Not Currently  Other Topics Concern   Not on file  Social History Narrative   Lives alone   Social Determinants of Health   Financial Resource Strain: Low Risk    Difficulty of Paying Living Expenses: Not very hard  Food Insecurity: No Food Insecurity   Worried About Running Out of Food in the Last Year: Never true   Ran Out of Food in the Last Year: Never true  Transportation Needs: No Transportation Needs   Lack of Transportation (Medical): No   Lack of Transportation (Non-Medical): No  Physical Activity: Inactive   Days of Exercise per Week: 0 days   Minutes of Exercise per Session: 0 min  Stress: Not on file  Social Connections: Socially Isolated   Frequency of Communication with Friends and Family: More than three times a week   Frequency of Social  Gatherings with Friends and Family: More than three times a week   Attends Religious Services: Never   Marine scientist or Organizations: No   Attends Archivist Meetings: Never   Marital Status: Divorced  Human resources officer Violence: Not on file    Allergies  Allergen Reactions   Codeine     Headache     Current Outpatient Medications  Medication Sig Dispense Refill   acetaminophen (TYLENOL) 160 MG/5ML liquid Take 650 mg by mouth every 4 (four) hours as needed for fever.     allopurinol (ZYLOPRIM) 100 MG tablet Give 2 tablet by mouth one time a day for gout.     apixaban (ELIQUIS) 5 MG TABS tablet Take 1 tablet (5 mg total) by mouth 2 (two) times daily. 60 tablet    atorvastatin (LIPITOR) 20 MG tablet TAKE 1 TABLET BY MOUTH EVERY DAY  (Patient taking differently: TAKE 1 TABLET BY MOUTH EVERY DAY AT BED TIME) 90 tablet 3   B Complex-C (B-COMPLEX WITH VITAMIN C) tablet Take 1 tablet by mouth daily.     benzonatate (TESSALON) 100 MG capsule Take 1 capsule (100 mg total) by mouth 2 (two) times daily as needed for cough.     epoetin alfa (EPOGEN) 2000 UNIT/ML injection 2,000 Units 3 (three) times a week. Tuesday thursday saturday     ferrous sulfate 325 (65 FE) MG tablet Take 325 mg by mouth daily with breakfast.      glucose blood (CONTOUR NEXT TEST) test strip Test BS TID Dx E11.29 300 strip 3   insulin aspart (NOVOLOG) 100 UNIT/ML injection Before each meal 3 times a day, 140-199 - 2 units, 200-250 - 4 units, 251-299 - 6 units,  300-349 - 8 units,  350 or above 10 units. Insulin PEN if approved, provide syringes and needles if needed. 10 mL 0   insulin glargine-yfgn (SEMGLEE) 100 UNIT/ML Pen Inject 16 Units into the skin at bedtime.     Insulin Pen Needle 32G X 4 MM MISC Use to give insulin daily Dx E11.29 100 each 3   lactulose (CHRONULAC) 10 GM/15ML solution Take 30 mLs (20 g total) by mouth 2 (two) times daily. 236 mL 0   levothyroxine (SYNTHROID) 100 MCG tablet Take 100 mcg by mouth every morning.     melatonin 5 MG TABS Take 5 mg by mouth at bedtime as needed.     metoprolol succinate (TOPROL-XL) 50 MG 24 hr tablet Take by mouth.     oxyCODONE (OXY IR/ROXICODONE) 5 MG immediate release tablet Take 1 tablet (5 mg total) by mouth every 6 (six) hours as needed for moderate pain or severe pain. 5 tablet 0   OXYGEN Inhale 2 L into the lungs. Continuous     pantoprazole (PROTONIX) 40 MG tablet TAKE 1 TABLET BY MOUTH EVERY DAY 90 tablet 1   polyethylene glycol (MIRALAX / GLYCOLAX) 17 g packet Take 17 g by mouth daily. 14 each 0   potassium chloride SA (KLOR-CON M) 20 MEQ tablet Take 20 mEq by mouth daily.     sildenafil (REVATIO) 20 MG tablet Take 1 tablet (20 mg total) by mouth 3 (three) times daily. 90 tablet 11   torsemide  (DEMADEX) 20 MG tablet Take by mouth.     Zinc Oxide (TRIPLE PASTE) 12.8 % ointment Apply topically 2 (two) times daily. 56.7 g 0   No current facility-administered medications for this visit.    REVIEW OF SYSTEMS:  [X]  denotes positive finding, [ ]   denotes negative finding Cardiac  Comments:  Chest pain or chest pressure:    Shortness of breath upon exertion: x   Short of breath when lying flat:    Irregular heart rhythm: x       Vascular    Pain in calf, thigh, or hip brought on by ambulation:    Pain in feet at night that wakes you up from your sleep:     Blood clot in your veins:    Leg swelling:  x       Pulmonary    Oxygen at home:    Productive cough:     Wheezing:         Neurologic    Sudden weakness in arms or legs:     Sudden numbness in arms or legs:     Sudden onset of difficulty speaking or slurred speech:    Temporary loss of vision in one eye:     Problems with dizziness:         Gastrointestinal    Blood in stool:     Vomited blood:         Genitourinary    Burning when urinating:     Blood in urine:        Psychiatric    Major depression:         Hematologic    Bleeding problems:    Problems with blood clotting too easily:        Skin    Rashes or ulcers:        Constitutional    Fever or chills:      PHYSICAL EXAM: Vitals:   02/17/21 0945  BP: 113/73  Pulse: 82  Resp: 18  Temp: 97.7 F (36.5 C)  TempSrc: Temporal  SpO2: 97%  Weight: 238 lb 9.6 oz (108.2 kg)  Height: 5' 4"  (1.626 m)    GENERAL: The patient is a well-nourished male, in no acute distress. The vital signs are documented above.  Patient is wheelchair-bound CARDIOVASCULAR: 2+ brachial pulses bilaterally.  Faint radial pulse in the left and 2+ radial pulse on the right.  He does have small to moderate cephalic vein in the forearm bilaterally. PULMONARY: There is good air exchange  MUSCULOSKELETAL: There are no major deformities or cyanosis. NEUROLOGIC: No focal  weakness or paresthesias are detected. SKIN: There are no ulcers or rashes noted. PSYCHIATRIC: The patient has a normal affect.  DATA:  I imaged his bilateral upper extremity veins with SonoSite ultrasound.  He does have patency of his cephalic vein in the forearm and upper arm on the right but does relatively small throughout its course.  He does have a large basilic vein on the medial aspect of his right upper arm.  MEDICAL ISSUES: I discussed options for long-term hemodialysis with the patient.  Explained limitations of long-term use of catheter with the risk for infection and catheter-based sepsis.  Also discussed options for AV fistula and AV graft and the advantages and disadvantages of each.  I feel that his best option initially would be right arm for stage basilic vein to brachial artery fistula.  He is left-handed.  He understands that we would then see him back in the office in 4 to 6 weeks to assure that the fistula is maturing and then would have a second stage as an outpatient at [facility] for transposition.  We have scheduled initial fistula for Tuesday, 03/02/2021.  He does have dialysis on Tuesday Thursday Saturday so  we will coordinate moving his dialysis today.  Also we will coordinate holding his Coumadin for the procedure   [name], MD [facility] Vascular and Vein Specialists of [facility] [phone] Pager [phone]  Note: Portions of this report may have been transcribed using voice recognition software.  Every effort has been made to ensure accuracy; however, inadvertent computerized transcription errors may still be present.

## 2021-02-18 ENCOUNTER — Other Ambulatory Visit: Payer: Self-pay

## 2021-02-18 DIAGNOSIS — N186 End stage renal disease: Secondary | ICD-10-CM | POA: Diagnosis not present

## 2021-02-18 DIAGNOSIS — D509 Iron deficiency anemia, unspecified: Secondary | ICD-10-CM | POA: Diagnosis not present

## 2021-02-18 DIAGNOSIS — D631 Anemia in chronic kidney disease: Secondary | ICD-10-CM | POA: Diagnosis not present

## 2021-02-18 DIAGNOSIS — Z992 Dependence on renal dialysis: Secondary | ICD-10-CM | POA: Diagnosis not present

## 2021-02-20 DIAGNOSIS — N186 End stage renal disease: Secondary | ICD-10-CM | POA: Diagnosis not present

## 2021-02-20 DIAGNOSIS — D631 Anemia in chronic kidney disease: Secondary | ICD-10-CM | POA: Diagnosis not present

## 2021-02-20 DIAGNOSIS — Z992 Dependence on renal dialysis: Secondary | ICD-10-CM | POA: Diagnosis not present

## 2021-02-20 DIAGNOSIS — D509 Iron deficiency anemia, unspecified: Secondary | ICD-10-CM | POA: Diagnosis not present

## 2021-02-23 DIAGNOSIS — D631 Anemia in chronic kidney disease: Secondary | ICD-10-CM | POA: Diagnosis not present

## 2021-02-23 DIAGNOSIS — Z992 Dependence on renal dialysis: Secondary | ICD-10-CM | POA: Diagnosis not present

## 2021-02-23 DIAGNOSIS — N186 End stage renal disease: Secondary | ICD-10-CM | POA: Diagnosis not present

## 2021-02-23 DIAGNOSIS — D509 Iron deficiency anemia, unspecified: Secondary | ICD-10-CM | POA: Diagnosis not present

## 2021-02-25 ENCOUNTER — Encounter (INDEPENDENT_AMBULATORY_CARE_PROVIDER_SITE_OTHER): Payer: Self-pay

## 2021-02-25 DIAGNOSIS — D631 Anemia in chronic kidney disease: Secondary | ICD-10-CM | POA: Diagnosis not present

## 2021-02-25 DIAGNOSIS — N186 End stage renal disease: Secondary | ICD-10-CM | POA: Diagnosis not present

## 2021-02-25 DIAGNOSIS — Z992 Dependence on renal dialysis: Secondary | ICD-10-CM | POA: Diagnosis not present

## 2021-02-25 DIAGNOSIS — D509 Iron deficiency anemia, unspecified: Secondary | ICD-10-CM | POA: Diagnosis not present

## 2021-02-26 ENCOUNTER — Encounter (HOSPITAL_COMMUNITY)
Admission: RE | Admit: 2021-02-26 | Discharge: 2021-02-26 | Disposition: A | Discharge status: Home / Self Care | Payer: Medicare Other | Source: Ambulatory Visit | Attending: Vascular Surgery | Admitting: Vascular Surgery

## 2021-02-27 DIAGNOSIS — D631 Anemia in chronic kidney disease: Secondary | ICD-10-CM | POA: Diagnosis not present

## 2021-02-27 DIAGNOSIS — N186 End stage renal disease: Secondary | ICD-10-CM | POA: Diagnosis not present

## 2021-02-27 DIAGNOSIS — Z992 Dependence on renal dialysis: Secondary | ICD-10-CM | POA: Diagnosis not present

## 2021-02-27 DIAGNOSIS — D509 Iron deficiency anemia, unspecified: Secondary | ICD-10-CM | POA: Diagnosis not present

## 2021-03-01 NOTE — Pre-Procedure Instructions (Signed)
Belinda from [facility] called to verify arrival time, NPO status and meds to take on the morning of surgery. She verbalizes understanding of this.

## 2021-03-02 ENCOUNTER — Encounter (HOSPITAL_COMMUNITY): Payer: Self-pay | Admitting: Vascular Surgery

## 2021-03-02 ENCOUNTER — Ambulatory Visit (HOSPITAL_COMMUNITY)
Admission: RE | Admit: 2021-03-02 | Discharge: 2021-03-02 | Disposition: A | Discharge status: Home / Self Care | Payer: Medicare Other | Attending: Vascular Surgery | Admitting: Vascular Surgery

## 2021-03-02 ENCOUNTER — Ambulatory Visit (HOSPITAL_BASED_OUTPATIENT_CLINIC_OR_DEPARTMENT_OTHER): Payer: Medicare Other | Admitting: Anesthesiology

## 2021-03-02 ENCOUNTER — Other Ambulatory Visit: Payer: Self-pay

## 2021-03-02 ENCOUNTER — Ambulatory Visit (HOSPITAL_COMMUNITY): Payer: Medicare Other | Admitting: Anesthesiology

## 2021-03-02 ENCOUNTER — Encounter (HOSPITAL_COMMUNITY)
Admission: RE | Disposition: A | Discharge status: Home / Self Care | Payer: Self-pay | Source: Home / Self Care | Attending: Vascular Surgery

## 2021-03-02 DIAGNOSIS — E1122 Type 2 diabetes mellitus with diabetic chronic kidney disease: Secondary | ICD-10-CM | POA: Insufficient documentation

## 2021-03-02 DIAGNOSIS — Z992 Dependence on renal dialysis: Secondary | ICD-10-CM | POA: Diagnosis not present

## 2021-03-02 DIAGNOSIS — Z7901 Long term (current) use of anticoagulants: Secondary | ICD-10-CM | POA: Insufficient documentation

## 2021-03-02 DIAGNOSIS — I428 Other cardiomyopathies: Secondary | ICD-10-CM | POA: Diagnosis not present

## 2021-03-02 DIAGNOSIS — I132 Hypertensive heart and chronic kidney disease with heart failure and with stage 5 chronic kidney disease, or end stage renal disease: Secondary | ICD-10-CM | POA: Diagnosis not present

## 2021-03-02 DIAGNOSIS — N186 End stage renal disease: Secondary | ICD-10-CM | POA: Insufficient documentation

## 2021-03-02 DIAGNOSIS — I482 Chronic atrial fibrillation, unspecified: Secondary | ICD-10-CM | POA: Diagnosis not present

## 2021-03-02 DIAGNOSIS — Z794 Long term (current) use of insulin: Secondary | ICD-10-CM | POA: Diagnosis not present

## 2021-03-02 DIAGNOSIS — I5032 Chronic diastolic (congestive) heart failure: Secondary | ICD-10-CM | POA: Diagnosis not present

## 2021-03-02 DIAGNOSIS — I2510 Atherosclerotic heart disease of native coronary artery without angina pectoris: Secondary | ICD-10-CM

## 2021-03-02 DIAGNOSIS — I509 Heart failure, unspecified: Secondary | ICD-10-CM

## 2021-03-02 DIAGNOSIS — Z87891 Personal history of nicotine dependence: Secondary | ICD-10-CM | POA: Diagnosis not present

## 2021-03-02 HISTORY — PX: AV FISTULA PLACEMENT: SHX1204

## 2021-03-02 LAB — POCT I-STAT, CHEM 8
BUN: 53 mg/dL — ABNORMAL HIGH (ref 8–23)
Calcium, Ion: 1.18 mmol/L (ref 1.15–1.40)
Chloride: 104 mmol/L (ref 98–111)
Creatinine, Ser: 8.3 mg/dL — ABNORMAL HIGH (ref 0.61–1.24)
Glucose, Bld: 108 mg/dL — ABNORMAL HIGH (ref 70–99)
HCT: 30 % — ABNORMAL LOW (ref 39.0–52.0)
Hemoglobin: 10.2 g/dL — ABNORMAL LOW (ref 13.0–17.0)
Potassium: 4.8 mmol/L (ref 3.5–5.1)
Sodium: 138 mmol/L (ref 135–145)
TCO2: 23 mmol/L (ref 22–32)

## 2021-03-02 SURGERY — ARTERIOVENOUS (AV) FISTULA CREATION
Anesthesia: General | Site: Arm Lower | Laterality: Right

## 2021-03-02 MED ORDER — PHENYLEPHRINE 40 MCG/ML (10ML) SYRINGE FOR IV PUSH (FOR BLOOD PRESSURE SUPPORT)
PREFILLED_SYRINGE | INTRAVENOUS | Status: AC
  Filled 2021-03-02: qty 10

## 2021-03-02 MED ORDER — METOCLOPRAMIDE HCL 5 MG/ML IJ SOLN
10.0000 mg | Freq: Once | INTRAMUSCULAR | Status: AC
  Administered 2021-03-02: 10 mg via INTRAVENOUS

## 2021-03-02 MED ORDER — MIDAZOLAM HCL 5 MG/5ML IJ SOLN
INTRAMUSCULAR | Status: DC | PRN
Start: 2021-03-02 — End: 2021-03-02
  Administered 2021-03-02: 1 mg via INTRAVENOUS

## 2021-03-02 MED ORDER — PROPOFOL 500 MG/50ML IV EMUL
INTRAVENOUS | Status: DC | PRN
Start: 2021-03-02 — End: 2021-03-02
  Administered 2021-03-02: 25 ug/kg/min via INTRAVENOUS

## 2021-03-02 MED ORDER — FENTANYL CITRATE (PF) 100 MCG/2ML IJ SOLN
INTRAMUSCULAR | Status: DC | PRN
  Administered 2021-03-02: 25 ug via INTRAVENOUS

## 2021-03-02 MED ORDER — CHLORHEXIDINE GLUCONATE 4 % EX LIQD: 60.0000 mL | Freq: Once | CUTANEOUS | Status: DC

## 2021-03-02 MED ORDER — CEFAZOLIN SODIUM-DEXTROSE 2-4 GM/100ML-% IV SOLN
INTRAVENOUS | Status: AC
  Filled 2021-03-02: qty 100

## 2021-03-02 MED ORDER — MIDAZOLAM HCL 2 MG/2ML IJ SOLN
INTRAMUSCULAR | Status: AC
  Filled 2021-03-02: qty 2

## 2021-03-02 MED ORDER — CEFAZOLIN SODIUM-DEXTROSE 2-4 GM/100ML-% IV SOLN
2.0000 g | INTRAVENOUS | Status: AC
  Administered 2021-03-02: 2 g via INTRAVENOUS

## 2021-03-02 MED ORDER — LIDOCAINE 2% (20 MG/ML) 5 ML SYRINGE
INTRAMUSCULAR | Status: DC | PRN
  Administered 2021-03-02: 40 mg via INTRAVENOUS

## 2021-03-02 MED ORDER — FENTANYL CITRATE (PF) 100 MCG/2ML IJ SOLN
INTRAMUSCULAR | Status: AC
  Filled 2021-03-02: qty 2

## 2021-03-02 MED ORDER — LIDOCAINE-EPINEPHRINE 0.5 %-1:200000 IJ SOLN
INTRAMUSCULAR | Status: DC | PRN
  Administered 2021-03-02: 13 mL

## 2021-03-02 MED ORDER — HEPARIN SODIUM (PORCINE) 1000 UNIT/ML IJ SOLN
INTRAMUSCULAR | Status: AC
  Filled 2021-03-02: qty 6

## 2021-03-02 MED ORDER — METOCLOPRAMIDE HCL 5 MG/ML IJ SOLN
INTRAMUSCULAR | Status: AC
  Filled 2021-03-02: qty 2

## 2021-03-02 MED ORDER — ORAL CARE MOUTH RINSE: 15.0000 mL | Freq: Once | OROMUCOSAL | Status: AC

## 2021-03-02 MED ORDER — LIDOCAINE-EPINEPHRINE 0.5 %-1:200000 IJ SOLN
INTRAMUSCULAR | Status: AC
  Filled 2021-03-02: qty 1

## 2021-03-02 MED ORDER — 0.9 % SODIUM CHLORIDE (POUR BTL) OPTIME
TOPICAL | Status: DC | PRN
  Administered 2021-03-02: 1000 mL

## 2021-03-02 MED ORDER — ONDANSETRON HCL 4 MG/2ML IJ SOLN
INTRAMUSCULAR | Status: AC
  Filled 2021-03-02: qty 2

## 2021-03-02 MED ORDER — ONDANSETRON HCL 4 MG/2ML IJ SOLN
INTRAMUSCULAR | Status: DC | PRN
  Administered 2021-03-02: 4 mg via INTRAVENOUS

## 2021-03-02 MED ORDER — CHLORHEXIDINE GLUCONATE 0.12 % MT SOLN
15.0000 mL | Freq: Once | OROMUCOSAL | Status: AC
  Administered 2021-03-02: 15 mL via OROMUCOSAL

## 2021-03-02 MED ORDER — SODIUM CHLORIDE 0.9 % IV SOLN: INTRAVENOUS | Status: DC

## 2021-03-02 MED ORDER — CHLORHEXIDINE GLUCONATE 4 % EX LIQD
60.0000 mL | Freq: Once | CUTANEOUS | Status: DC
Start: 2021-03-03 — End: 2021-03-02

## 2021-03-02 MED ORDER — SODIUM CHLORIDE 0.9 % IV SOLN
INTRAVENOUS | Status: DC
Start: 2021-03-02 — End: 2021-03-02

## 2021-03-02 MED ORDER — TRAMADOL HCL 50 MG PO TABS: 50.0000 mg | ORAL_TABLET | Freq: Four times a day (QID) | ORAL | 0 refills | Status: DC | PRN

## 2021-03-02 MED ORDER — HEPARIN 6000 UNIT IRRIGATION SOLUTION
Status: DC | PRN
Start: 2021-03-02 — End: 2021-03-02
  Administered 2021-03-02: 1

## 2021-03-02 SURGICAL SUPPLY — 41 items
ADH SKN CLS APL DERMABOND .7 (GAUZE/BANDAGES/DRESSINGS) ×1
ARMBAND PINK RESTRICT EXTREMIT (MISCELLANEOUS) ×2 IMPLANT
BAG HAMPER (MISCELLANEOUS) ×2 IMPLANT
CANNULA VESSEL 3MM 2 BLNT TIP (CANNULA) ×2 IMPLANT
CLIP LIGATING EXTRA MED SLVR (CLIP) ×2 IMPLANT
CLIP LIGATING EXTRA SM BLUE (MISCELLANEOUS) ×2 IMPLANT
COVER LIGHT HANDLE STERIS (MISCELLANEOUS) ×4 IMPLANT
COVER MAYO STAND XLG (MISCELLANEOUS) ×2 IMPLANT
DECANTER SPIKE VIAL GLASS SM (MISCELLANEOUS) ×2 IMPLANT
DERMABOND ADVANCED (GAUZE/BANDAGES/DRESSINGS) ×1
DERMABOND ADVANCED .7 DNX12 (GAUZE/BANDAGES/DRESSINGS) ×1 IMPLANT
ELECT REM PT RETURN 9FT ADLT (ELECTROSURGICAL) ×2
ELECTRODE REM PT RTRN 9FT ADLT (ELECTROSURGICAL) ×1 IMPLANT
GAUZE SPONGE 4X4 12PLY STRL (GAUZE/BANDAGES/DRESSINGS) ×2 IMPLANT
GLOVE SURG MICRO LTX SZ7.5 (GLOVE) ×2 IMPLANT
GLOVE SURG UNDER POLY LF SZ7 (GLOVE) ×6 IMPLANT
GOWN STRL REUS W/TWL LRG LVL3 (GOWN DISPOSABLE) ×6 IMPLANT
IV NS 500ML (IV SOLUTION) ×2
IV NS 500ML BAXH (IV SOLUTION) ×1 IMPLANT
KIT BLADEGUARD II DBL (SET/KITS/TRAYS/PACK) ×2 IMPLANT
KIT TURNOVER KIT A (KITS) ×2 IMPLANT
MANIFOLD NEPTUNE II (INSTRUMENTS) ×2 IMPLANT
MARKER SKIN DUAL TIP RULER LAB (MISCELLANEOUS) ×4 IMPLANT
NDL HYPO 18GX1.5 BLUNT FILL (NEEDLE) ×1 IMPLANT
NDL HYPO 25X1 1.5 SAFETY (NEEDLE) IMPLANT
NEEDLE HYPO 18GX1.5 BLUNT FILL (NEEDLE) ×2 IMPLANT
NEEDLE HYPO 25X1 1.5 SAFETY (NEEDLE) ×2 IMPLANT
NS IRRIG 1000ML POUR BTL (IV SOLUTION) ×2 IMPLANT
PACK CV ACCESS (CUSTOM PROCEDURE TRAY) ×2 IMPLANT
PAD ARMBOARD 7.5X6 YLW CONV (MISCELLANEOUS) ×2 IMPLANT
SET BASIN LINEN APH (SET/KITS/TRAYS/PACK) ×2 IMPLANT
SOL PREP POV-IOD 4OZ 10% (MISCELLANEOUS) ×2 IMPLANT
SOL PREP PROV IODINE SCRUB 4OZ (MISCELLANEOUS) ×2 IMPLANT
SPONGE T-LAP 18X18 ~~LOC~~+RFID (SPONGE) ×2 IMPLANT
SUT PROLENE 6 0 CC (SUTURE) ×2 IMPLANT
SUT SILK 2 0 SH (SUTURE) IMPLANT
SUT VIC AB 3-0 SH 27 (SUTURE) ×2
SUT VIC AB 3-0 SH 27X BRD (SUTURE) ×1 IMPLANT
SYR 10ML LL (SYRINGE) ×2 IMPLANT
SYR 50ML LL SCALE MARK (SYRINGE) ×2 IMPLANT
UNDERPAD 30X36 HEAVY ABSORB (UNDERPADS AND DIAPERS) ×2 IMPLANT

## 2021-03-02 NOTE — Op Note (Signed)
° ° °  OPERATIVE REPORT  DATE OF SURGERY: 03/02/2021  PATIENT: William Flores, 71 y.o. male MRN: [id number]  DOB: 1950-10-03  PRE-OPERATIVE DIAGNOSIS: End-stage renal disease  POST-OPERATIVE DIAGNOSIS:  Same  PROCEDURE: Right brachiocephalic AV fistula creation  SURGEON:  Curt Jews, M.D.  PHYSICIAN ASSISTANT: Vevelyn Royals, RN  The assistant was needed for exposure and to expedite the case  ANESTHESIA: Local with sedation  EBL: per anesthesia record  Total I/O In: 550 [I.V.:550] Out: -   BLOOD ADMINISTERED: none  DRAINS: none  SPECIMEN: none  COUNTS CORRECT:  YES  PATIENT DISPOSITION:  PACU - hemodynamically stable  PROCEDURE DETAILS: The patient was taken the operating placed supine position where the area of the right arm prepped draped you sterile fashion.  SonoSite ultrasound was used to visualize the veins in the arm.  Patient had relatively small veins in the forearm that branched in the mid forearm.  Patient did have a moderate caliber cephalic vein at the antecubital space and this was patent throughout his upper arm.  He did have good caliber basilic vein with branching just above the elbow.  Using local anesthesia, an incision was made over the cephalic vein just below the antecubital space.  Tributary branches were ligated with 4-0 silk ties and divided.  The vein was ligated distally and divided and was gently dilated and was felt to be adequate for fistula attempt.  The vein was mobilized above the antecubital space as well through the longitudinal incision.  A separate incision was made over the brachial artery at the antecubital space.  The artery was of normal caliber.  There was a great deal of calcification in the artery.  A tonsil clamp was used to create a tunnel between the brachial artery and the cephalic vein and the cephalic vein was brought through the tunnel.  Care was taken to prevent twisting.  The artery was occluded proximally and distally with  fistula clamps and was opened with an 11 blade extended longitudinally with Potts scissors.  The vein was cut to the appropriate length and was spatulated and sewn end-to-side to the artery with a running 6-0 Prolene suture.  Clamps were removed and good thrill was noted.  The patient maintained a right radial pulse.  The wounds were irrigated with saline.  Hemostasis was obtained with electrocautery.  Wounds were closed with 3-0 Vicryl in the subcutaneous and subcuticular tissue.  Sterile dressing was applied and the patient was transferred to the recovery room in stable condition   [name], M.D., [facility] 03/02/2021 11:31 AM  Note: Portions of this report may have been transcribed using voice recognition software.  Every effort has been made to ensure accuracy; however, inadvertent computerized transcription errors may still be present.

## 2021-03-02 NOTE — Transfer of Care (Signed)
Immediate Anesthesia Transfer of Care Note  Patient: EKAM BESSON  Procedure(s) Performed: RIGHT ARM ARTERIOVENOUS (AV) FISTULA CREATION (Right: Arm Lower)  Patient Location: PACU  Anesthesia Type:MAC  Level of Consciousness: awake, alert , oriented and patient cooperative  Airway & Oxygen Therapy: Patient Spontanous Breathing and Patient connected to nasal cannula oxygen  Post-op Assessment: Report given to RN, Post -op Vital signs reviewed and stable and Patient moving all extremities  Post vital signs: Reviewed and stable  Last Vitals:  Vitals Value Taken Time  BP    Temp    Pulse 102 03/02/21 1130  Resp 24 03/02/21 1131  SpO2 92 % 03/02/21 1130  Vitals shown include unvalidated device data.  Last Pain:  Vitals:   03/02/21 0833  TempSrc: Oral  PainSc: 0-No pain      Patients Stated Pain Goal: 6 (94/76/54 [id number])  Complications: No notable events documented.

## 2021-03-02 NOTE — Progress Notes (Signed)
[facility] to give report regarding pt. Spoke to receptionist who forwarded me to nursing supervisor. Did not answer and left a message.    Attempted to call back again. No answer

## 2021-03-02 NOTE — Anesthesia Postprocedure Evaluation (Signed)
Anesthesia Post Note  Patient: William Flores  Procedure(s) Performed: RIGHT ARM ARTERIOVENOUS (AV) FISTULA CREATION (Right: Arm Lower)  Patient location during evaluation: Phase II Anesthesia Type: General Level of consciousness: awake and alert and oriented Pain management: pain level controlled Vital Signs Assessment: post-procedure vital signs reviewed and stable Respiratory status: spontaneous breathing, nonlabored ventilation and respiratory function stable Cardiovascular status: blood pressure returned to baseline and stable Postop Assessment: no apparent nausea or vomiting Anesthetic complications: no   No notable events documented.   Last Vitals:  Vitals:   03/02/21 1145 03/02/21 1159  BP: 106/66 (!) 106/59  Pulse: 99 80  Resp: (!) 22 18  Temp:  37.2 C  SpO2: 97% 98%    Last Pain:  Vitals:   03/02/21 1159  TempSrc: Oral  PainSc: 0-No pain                 Rica Heather C Delberta Folts

## 2021-03-02 NOTE — Anesthesia Preprocedure Evaluation (Addendum)
Anesthesia Evaluation  Patient identified by MRN, date of birth, ID band Patient awake    Reviewed: Allergy & Precautions, NPO status , Patient's Chart, lab work & pertinent test results, reviewed documented beta blocker date and time   Airway Mallampati: II  TM Distance: >3 FB Neck ROM: Full    Dental  (+) Dental Advisory Given, Teeth Intact   Pulmonary sleep apnea and Oxygen sleep apnea , former smoker,    Pulmonary exam normal breath sounds clear to auscultation       Cardiovascular hypertension, Pt. on medications and Pt. on home beta blockers + CAD and +CHF  Normal cardiovascular exam Rhythm:Regular Rate:Normal  1. Left ventricular ejection fraction, by estimation, is 55 to 60%. The left ventricle has normal function. The left ventricle has no regional wall motion abnormalities. The left ventricular internal cavity size was moderately dilated. There is mild left ventricular hypertrophy. Left ventricular diastolic parameters are indeterminate. There is the interventricular septum is flattened in  systole and diastole, consistent with right ventricular pressure and volume overload.  2. Right ventricular systolic function is severely reduced. The right  ventricular size is severely enlarged. There is severely elevated  pulmonary artery systolic pressure.  3. Left atrial size was moderately dilated.  4. Right atrial size was severely dilated.  5. The mitral valve is abnormal. Mild to moderate mitral valve regurgitation. No evidence of mitral stenosis. Moderate mitral annular calcification.  6. Tricuspid valve regurgitation is moderate.  7. The aortic valve is calcified. Aortic valve regurgitation is not visualized. No aortic stenosis is present.  8. The inferior vena cava is dilated in size with <50% respiratory variability, suggesting right atrial pressure of 15 mmHg.    Neuro/Psych    GI/Hepatic GERD  Medicated,(+)  Hepatitis -  Endo/Other  diabetes  Renal/GU Dialysis and ESRFRenal disease     Musculoskeletal   Abdominal   Peds  Hematology  (+) Blood dyscrasia, anemia ,   Anesthesia Other Findings   Reproductive/Obstetrics                                                            Anesthesia Evaluation  Patient identified by MRN, date of birth, ID band Patient awake    Reviewed: Allergy & Precautions, NPO status , Patient's Chart, lab work & pertinent test results, reviewed documented beta blocker date and time   Airway Mallampati: II  TM Distance: >3 FB Neck ROM: Full    Dental  (+) Dental Advisory Given, Missing   Pulmonary shortness of breath, with exertion, lying and Long-Term Oxygen Therapy, sleep apnea, Continuous Positive Airway Pressure Ventilation and Oxygen sleep apnea , former smoker,           Cardiovascular Exercise Tolerance: Poor hypertension, Pt. on medications and Pt. on home beta blockers + CAD, +CHF and + DOE  + dysrhythmias Atrial Fibrillation  Rhythm:Irregular Rate:Normal  1. Left ventricular ejection fraction, by estimation, is 55 to 60%. The  left ventricle has normal function. The left ventricle has no regional  wall motion abnormalities. There is moderate concentric left ventricular  hypertrophy. Left ventricular diastolic function could not be evaluated. There is the interventricular septum is flattened in systole and diastole, consistent with right  ventricular pressure and volume overload.  2. Right ventricular systolic function is  moderately reduced. The right  ventricular size is severely enlarged.  3. Left atrial size was moderately dilated.  4. Right atrial size was severely dilated.  5. The mitral valve is degenerative. Mild mitral valve regurgitation. No  evidence of mitral stenosis.  6. The aortic valve is tricuspid. There is moderate calcification of the  aortic valve. There is moderate thickening of  the aortic valve. Aortic valve regurgitation is not visualized. Mild to moderate aortic valve sclerosis/calcification is present,  without any evidence of aortic stenosis.   14-Oct-2020 10:35:28 [place] [facility]-ER ROUTINE RECORD November 15, 1950 (15 yr) Male Caucasian Vent. rate 89 BPM PR interval * ms QRS duration 104 ms QT/QTcB 391/476 ms P-R-T axes * 115 238 Right and left arm electrode reversal, interpretation assumes no reversal Atrial fibrillation Probable lateral infarct, age indeterminate Anteroseptal infarct, old No significant change since prior 6/22 Confirmed by Aletta Edouard [phone]) on 10/14/2020 10:38:23 AM   Neuro/Psych negative neurological ROS     GI/Hepatic GERD  Medicated and Controlled,(+) Cirrhosis       ,   Endo/Other  diabetes, Well Controlled, Type 2, Insulin DependentMorbid obesity  Renal/GU Renal Insufficiency and CRFRenal disease     Musculoskeletal   Abdominal   Peds  Hematology  (+) anemia ,   Anesthesia Other Findings Principal Problem:   GI bleed Active Problems:   Gout   Hypertensive heart disease   History of colon cancer   Morbid obesity ([place])   Right heart failure (HCC)   OSA (obstructive sleep apnea)   Nonischemic cardiomyopathy ([place])   Anticoagulated on Coumadin   Atrial fibrillation with slow ventricular response (HCC)   DM (diabetes mellitus), type 2 with renal complications (HCC)   Peripheral edema   Essential hypertension   Normocytic anemia   MGUS (monoclonal gammopathy of unknown significance)   CKD stage 3 due to type 2 diabetes mellitus (HCC)   Hyperlipidemia associated with type 2 diabetes mellitus ([place])   Morbid obesity with BMI of 45.0-49.9, adult (HCC)   Pulmonary hypertension (HCC)   Acute on chronic congestive heart failure (HCC)   Bilateral leg edema   Fecal occult blood test positive   Acquired thrombophilia (HCC)   Cirrhosis (HCC)   Acute blood loss anemia    Reproductive/Obstetrics                              Anesthesia Physical Anesthesia Plan  ASA: 4 and emergent  Anesthesia Plan: MAC and General   Post-op Pain Management:    Induction: Intravenous  PONV Risk Score and Plan: TIVA  Airway Management Planned: Nasal Cannula, Natural Airway and Simple Face Mask  Additional Equipment:   Intra-op Plan:   Post-operative Plan: Possible Post-op intubation/ventilation  Informed Consent: I have reviewed the patients History and Physical, chart, labs and discussed the procedure including the risks, benefits and alternatives for the proposed anesthesia with the patient or authorized representative who has indicated his/her understanding and acceptance.     Dental advisory given  Plan Discussed with: CRNA and Surgeon  Anesthesia Plan Comments:       Anesthesia Quick Evaluation  Anesthesia Physical Anesthesia Plan  ASA: 4  Anesthesia Plan: General   Post-op Pain Management: Dilaudid IV   Induction: Intravenous  PONV Risk Score and Plan: 3 and Ondansetron and Dexamethasone  Airway Management Planned: Nasal Cannula and Natural Airway  Additional Equipment:   Intra-op Plan:   Post-operative Plan:  Informed Consent: I have reviewed the patients History and Physical, chart, labs and discussed the procedure including the risks, benefits and alternatives for the proposed anesthesia with the patient or authorized representative who has indicated his/her understanding and acceptance.     Dental advisory given  Plan Discussed with: CRNA and Surgeon  Anesthesia Plan Comments: (Possible GA with airway was discussed.)        Anesthesia Quick Evaluation                                  Anesthesia Evaluation  Patient identified by MRN, date of birth, ID band Patient awake    Reviewed: Allergy & Precautions, NPO status , Patient's Chart, lab work & pertinent test results, reviewed documented beta blocker date and time    Airway Mallampati: II  TM Distance: >3 FB Neck ROM: Full    Dental  (+) Dental Advisory Given, Missing   Pulmonary shortness of breath, with exertion, lying and Long-Term Oxygen Therapy, sleep apnea, Continuous Positive Airway Pressure Ventilation and Oxygen sleep apnea , former smoker,           Cardiovascular Exercise Tolerance: Poor hypertension, Pt. on medications and Pt. on home beta blockers + CAD, +CHF and + DOE  + dysrhythmias Atrial Fibrillation  Rhythm:Irregular Rate:Normal  1. Left ventricular ejection fraction, by estimation, is 55 to 60%. The  left ventricle has normal function. The left ventricle has no regional  wall motion abnormalities. There is moderate concentric left ventricular  hypertrophy. Left ventricular diastolic function could not be evaluated. There is the interventricular septum is flattened in systole and diastole, consistent with right  ventricular pressure and volume overload.  2. Right ventricular systolic function is moderately reduced. The right  ventricular size is severely enlarged.  3. Left atrial size was moderately dilated.  4. Right atrial size was severely dilated.  5. The mitral valve is degenerative. Mild mitral valve regurgitation. No  evidence of mitral stenosis.  6. The aortic valve is tricuspid. There is moderate calcification of the  aortic valve. There is moderate thickening of the aortic valve. Aortic valve regurgitation is not visualized. Mild to moderate aortic valve sclerosis/calcification is present,  without any evidence of aortic stenosis.   14-Oct-2020 10:35:28 [place] [facility]-ER ROUTINE RECORD [birth year] (41 yr) Male Caucasian Vent. rate 89 BPM PR interval * ms QRS duration 104 ms QT/QTcB 391/476 ms P-R-T axes * 115 238 Right and left arm electrode reversal, interpretation assumes no reversal Atrial fibrillation Probable lateral infarct, age indeterminate Anteroseptal infarct, old No  significant change since prior 6/22 Confirmed by Aletta Edouard [phone]) on 10/14/2020 10:38:23 AM   Neuro/Psych negative neurological ROS     GI/Hepatic GERD  Medicated and Controlled,(+) Cirrhosis       ,   Endo/Other  diabetes, Well Controlled, Type 2, Insulin DependentMorbid obesity  Renal/GU Renal Insufficiency and CRFRenal disease     Musculoskeletal   Abdominal   Peds  Hematology  (+) anemia ,   Anesthesia Other Findings Principal Problem:   GI bleed Active Problems:   Gout   Hypertensive heart disease   History of colon cancer   Morbid obesity ([place])   Right heart failure (HCC)   OSA (obstructive sleep apnea)   Nonischemic cardiomyopathy ([place])   Anticoagulated on Coumadin   Atrial fibrillation with slow ventricular response ([place])   DM (diabetes mellitus), type 2 with renal complications ([place])  Peripheral edema   Essential hypertension   Normocytic anemia   MGUS (monoclonal gammopathy of unknown significance)   CKD stage 3 due to type 2 diabetes mellitus (HCC)   Hyperlipidemia associated with type 2 diabetes mellitus (HCC)   Morbid obesity with BMI of 45.0-49.9, adult (HCC)   Pulmonary hypertension (HCC)   Acute on chronic congestive heart failure (HCC)   Bilateral leg edema   Fecal occult blood test positive   Acquired thrombophilia (HCC)   Cirrhosis (HCC)   Acute blood loss anemia    Reproductive/Obstetrics                             Anesthesia Physical Anesthesia Plan  ASA: 4 and emergent  Anesthesia Plan: MAC and General   Post-op Pain Management:    Induction: Intravenous  PONV Risk Score and Plan: TIVA  Airway Management Planned: Nasal Cannula, Natural Airway and Simple Face Mask  Additional Equipment:   Intra-op Plan:   Post-operative Plan: Possible Post-op intubation/ventilation  Informed Consent: I have reviewed the patients History and Physical, chart, labs and discussed the procedure including the  risks, benefits and alternatives for the proposed anesthesia with the patient or authorized representative who has indicated his/her understanding and acceptance.     Dental advisory given  Plan Discussed with: CRNA and Surgeon  Anesthesia Plan Comments:       Anesthesia Quick Evaluation                                   Anesthesia Evaluation  Patient identified by MRN, date of birth, ID band Patient awake    Reviewed: Allergy & Precautions, NPO status , Patient's Chart, lab work & pertinent test results, reviewed documented beta blocker date and time   Airway Mallampati: II  TM Distance: >3 FB Neck ROM: Full    Dental  (+) Dental Advisory Given, Missing   Pulmonary shortness of breath, with exertion, lying and Long-Term Oxygen Therapy, sleep apnea, Continuous Positive Airway Pressure Ventilation and Oxygen sleep apnea , former smoker,           Cardiovascular Exercise Tolerance: Poor hypertension, Pt. on medications and Pt. on home beta blockers + CAD, +CHF and + DOE  + dysrhythmias Atrial Fibrillation  Rhythm:Irregular Rate:Normal  1. Left ventricular ejection fraction, by estimation, is 55 to 60%. The  left ventricle has normal function. The left ventricle has no regional  wall motion abnormalities. There is moderate concentric left ventricular  hypertrophy. Left ventricular diastolic function could not be evaluated. There is the interventricular septum is flattened in systole and diastole, consistent with right  ventricular pressure and volume overload.  2. Right ventricular systolic function is moderately reduced. The right  ventricular size is severely enlarged.  3. Left atrial size was moderately dilated.  4. Right atrial size was severely dilated.  5. The mitral valve is degenerative. Mild mitral valve regurgitation. No  evidence of mitral stenosis.  6. The aortic valve is tricuspid. There is moderate calcification of the  aortic valve. There  is moderate thickening of the aortic valve. Aortic valve regurgitation is not visualized. Mild to moderate aortic valve sclerosis/calcification is present,  without any evidence of aortic stenosis.   14-Oct-2020 10:35:28 [place] [facility]-ER ROUTINE RECORD [birth year] (22 yr) Male Caucasian Vent. rate 89 BPM PR interval * ms QRS duration 104 ms QT/QTcB 391/476  ms P-R-T axes * 115 238 Right and left arm electrode reversal, interpretation assumes no reversal Atrial fibrillation Probable lateral infarct, age indeterminate Anteroseptal infarct, old No significant change since prior 6/22 Confirmed by Aletta Edouard [phone]) on 10/14/2020 10:38:23 AM   Neuro/Psych negative neurological ROS     GI/Hepatic GERD  Medicated and Controlled,(+) Cirrhosis       ,   Endo/Other  diabetes, Well Controlled, Type 2, Insulin DependentMorbid obesity  Renal/GU Renal Insufficiency and CRFRenal disease     Musculoskeletal   Abdominal   Peds  Hematology  (+) anemia ,   Anesthesia Other Findings Principal Problem:   GI bleed Active Problems:   Gout   Hypertensive heart disease   History of colon cancer   Morbid obesity ([place])   Right heart failure (HCC)   OSA (obstructive sleep apnea)   Nonischemic cardiomyopathy ([place])   Anticoagulated on Coumadin   Atrial fibrillation with slow ventricular response (HCC)   DM (diabetes mellitus), type 2 with renal complications (HCC)   Peripheral edema   Essential hypertension   Normocytic anemia   MGUS (monoclonal gammopathy of unknown significance)   CKD stage 3 due to type 2 diabetes mellitus (HCC)   Hyperlipidemia associated with type 2 diabetes mellitus ([place])   Morbid obesity with BMI of 45.0-49.9, adult (HCC)   Pulmonary hypertension (HCC)   Acute on chronic congestive heart failure (HCC)   Bilateral leg edema   Fecal occult blood test positive   Acquired thrombophilia (HCC)   Cirrhosis (HCC)   Acute blood loss anemia     Reproductive/Obstetrics                             Anesthesia Physical Anesthesia Plan  ASA: 4 and emergent  Anesthesia Plan: MAC and General   Post-op Pain Management:    Induction: Intravenous  PONV Risk Score and Plan: TIVA  Airway Management Planned: Nasal Cannula, Natural Airway and Simple Face Mask  Additional Equipment:   Intra-op Plan:   Post-operative Plan: Possible Post-op intubation/ventilation  Informed Consent: I have reviewed the patients History and Physical, chart, labs and discussed the procedure including the risks, benefits and alternatives for the proposed anesthesia with the patient or authorized representative who has indicated his/her understanding and acceptance.     Dental advisory given  Plan Discussed with: CRNA and Surgeon  Anesthesia Plan Comments:       Anesthesia Quick Evaluation

## 2021-03-02 NOTE — OR Nursing (Signed)
Attempted to call patient to have him come earlier.  Patient did not answer. Message left on machine by RN.

## 2021-03-02 NOTE — Interval H&P Note (Signed)
History and Physical Interval Note:  03/02/2021 8:40 AM  William Flores  has presented today for surgery, with the diagnosis of ESRD.  The various methods of treatment have been discussed with the patient and family. After consideration of risks, benefits and other options for treatment, the patient has consented to  Procedure(s): RIGHT ARM ARTERIOVENOUS (AV) FISTULA VERSUS ARTERIOVENOUS GRAFT CREATION (Right) as a surgical intervention.  The patient's history has been reviewed, patient examined, no change in status, stable for surgery.  I have reviewed the patient's chart and labs.  Questions were answered to the patient's satisfaction.     Curt Jews

## 2021-03-02 NOTE — Discharge Instructions (Signed)
[facility]  Discharge Instructions  AV Fistula or Graft Surgery for Dialysis Access  Please refer to the following instructions for your post-procedure care. Your surgeon or physician assistant will discuss any changes with you.  Activity  You may drive the day following your surgery, if you are comfortable and no longer taking prescription pain medication. Resume full activity as the soreness in your incision resolves.  Bathing/Showering  You may shower after you go home. Keep your incision dry for 48 hours. Do not soak in a bathtub, hot tub, or swim until the incision heals completely. You may not shower if you have a hemodialysis catheter.  Incision Care  Clean your incision with mild soap and water after 48 hours. Pat the area dry with a clean towel. You do not need a bandage unless otherwise instructed. Do not apply any ointments or creams to your incision. You may have skin glue on your incision. Do not peel it off. It will come off on its own in about one week. Your arm may swell a bit after surgery. To reduce swelling use pillows to elevate your arm so it is above your heart. Your doctor will tell you if you need to lightly wrap your arm with an ACE bandage.  Diet  Resume your normal diet. There are not special food restrictions following this procedure. In order to heal from your surgery, it is CRITICAL to get adequate nutrition. Your body requires vitamins, minerals, and protein. Vegetables are the best source of vitamins and minerals. Vegetables also provide the perfect balance of protein. Processed food has little nutritional value, so try to avoid this.  Medications  Resume taking all of your medications. If your incision is causing pain, you may take over-the counter pain relievers such as acetaminophen (Tylenol). If you were prescribed a stronger pain medication, please be aware these medications can cause nausea and constipation. Prevent  nausea by taking the medication with a snack or meal. Avoid constipation by drinking plenty of fluids and eating foods with high amount of fiber, such as fruits, vegetables, and grains.  Do not take Tylenol if you are taking prescription pain medications.  Follow up Your surgeon may want to see you in the office following your access surgery. If so, this will be arranged at the time of your surgery.  Please call us immediately for any of the following conditions:  Increased pain, redness, drainage (pus) from your incision site Fever of 101 degrees or higher Severe or worsening pain at your incision site Hand pain or numbness.  Reduce your risk of vascular disease:  Stop smoking. If you would like help, call [facility] at 1-800-QUIT-NOW (320)269-3587) or [place] at [place] your cholesterol Maintain a desired weight Control your diabetes Keep your blood pressure down  Dialysis  It will take several weeks to several months for your new dialysis access to be ready for use. Your surgeon will determine when it is okay to use it. Your nephrologist will continue to direct your dialysis. You can continue to use your Permcath until your new access is ready for use.   03/02/2021 William Flores [id number] [birth year]  Surgeon(s): Garren Greenman, Arvilla Meres, MD  Procedure(s): RIGHT ARM ARTERIOVENOUS (AV) FISTULA CREATION   May stick graft immediately   May stick graft on designated area only:    Do not stick fistula for 12 weeks    If you have any questions, please call the office at [phone].

## 2021-03-03 DIAGNOSIS — N2581 Secondary hyperparathyroidism of renal origin: Secondary | ICD-10-CM | POA: Diagnosis not present

## 2021-03-03 DIAGNOSIS — D631 Anemia in chronic kidney disease: Secondary | ICD-10-CM | POA: Diagnosis not present

## 2021-03-03 DIAGNOSIS — N179 Acute kidney failure, unspecified: Secondary | ICD-10-CM | POA: Diagnosis not present

## 2021-03-03 DIAGNOSIS — D509 Iron deficiency anemia, unspecified: Secondary | ICD-10-CM | POA: Diagnosis not present

## 2021-03-03 DIAGNOSIS — N186 End stage renal disease: Secondary | ICD-10-CM | POA: Diagnosis not present

## 2021-03-03 DIAGNOSIS — Z992 Dependence on renal dialysis: Secondary | ICD-10-CM | POA: Diagnosis not present

## 2021-03-04 DIAGNOSIS — N2581 Secondary hyperparathyroidism of renal origin: Secondary | ICD-10-CM | POA: Diagnosis not present

## 2021-03-04 DIAGNOSIS — N186 End stage renal disease: Secondary | ICD-10-CM | POA: Diagnosis not present

## 2021-03-04 DIAGNOSIS — N179 Acute kidney failure, unspecified: Secondary | ICD-10-CM | POA: Diagnosis not present

## 2021-03-04 DIAGNOSIS — Z992 Dependence on renal dialysis: Secondary | ICD-10-CM | POA: Diagnosis not present

## 2021-03-04 DIAGNOSIS — D631 Anemia in chronic kidney disease: Secondary | ICD-10-CM | POA: Diagnosis not present

## 2021-03-04 DIAGNOSIS — D509 Iron deficiency anemia, unspecified: Secondary | ICD-10-CM | POA: Diagnosis not present

## 2021-03-05 ENCOUNTER — Other Ambulatory Visit: Payer: Self-pay

## 2021-03-05 ENCOUNTER — Ambulatory Visit (INDEPENDENT_AMBULATORY_CARE_PROVIDER_SITE_OTHER): Payer: Medicare Other | Admitting: Urology

## 2021-03-05 ENCOUNTER — Encounter: Payer: Self-pay | Admitting: Urology

## 2021-03-05 VITALS — BP 97/64 | HR 97 | Ht 64.0 in | Wt 238.0 lb

## 2021-03-05 DIAGNOSIS — Z992 Dependence on renal dialysis: Secondary | ICD-10-CM | POA: Diagnosis not present

## 2021-03-05 DIAGNOSIS — N481 Balanitis: Secondary | ICD-10-CM

## 2021-03-05 DIAGNOSIS — R310 Gross hematuria: Secondary | ICD-10-CM | POA: Diagnosis not present

## 2021-03-05 DIAGNOSIS — N186 End stage renal disease: Secondary | ICD-10-CM

## 2021-03-05 NOTE — Progress Notes (Signed)
? ?Assessment: ?1. Gross hematuria   ?2. Balanitis   ?3. ESRD (end stage renal disease) on dialysis [facility])   ? ? ?Plan: ?Second request for all urinalysis and urine culture results from [facility] and rehab center. ?Please fax this information to [phone]. ?The patient was unable to provide a urine sample today. ?Please obtain a urinalysis and fax these results to the number above. ?Continue nystatin cream to penis twice daily x 7 days for balanitis ?Return to office in 6 weeks ? ?Chief Complaint:  ?Chief Complaint  ?Patient presents with  ? Hematuria  ? ? ?History of Present Illness: ? ?William Flores is a 71 y.o. year old male who is seen for further evaluation of gross hematuria.  The patient has multiple medical problems and is currently on hemodialysis for end-stage renal disease.  He had an episode of gross hematuria in January 2023.  This was associated with dysuria.  No flank or abdominal pain.  No lab results available for review.  He was told he had a UTI and treated with antibiotics.  The gross hematuria subsequently resolved.  He was not having any dysuria or other urinary symptoms at his visit on 02/02/21.  He voids approximately twice per day while on hemodialysis.  No history of UTIs or kidney stones.    ?He does have a remote history of tobacco use but has not smoked for over 30 years. ?CT imaging from 10/22 showed no renal or ureteral calculi and no evidence of obstruction. ?IPSS = 4. ?He was found to have balanitis on physical examination.  He was treated with nystatin cream. ? ?He returns today for follow-up.  He reports no further episodes of gross hematuria.  He reports that a urine sample was obtained at his nursing facility.  He was told that he had some evidence of a UTI and was treated with some antibiotics.  No records available regarding his urine studies.  He does report some occasional dysuria.  He reports that the balanitis has improved. ? ? ?Portions of the above  documentation were copied from a prior visit for review purposes only. ? ? ?Past Medical History:  ?Past Medical History:  ?Diagnosis Date  ? CAD (coronary artery disease)   ? LHC 2/08:  mRCA 50%, o/w no sig CAD, EF 25%  ? Cardiomyopathy ([place])   ? EF 35-40% in 2/16 in setting of AF with RVR >> echo 5/16 with improved LVF with EF 65-70%  ? Chronic atrial fibrillation (HCC)   ? Chronic diastolic heart failure ([place])   ? HFPF - EF ~65-70% (OFTEN EXACERBATED BY AFIB)  ? CKD (chronic kidney disease)   ? hx of worsening renal failure and hyperK+ in setting of acute diast CHF >> required CVVHD in 4/16 and dialysis x 1 in 5/16  ? CKD stage 3 due to type 2 diabetes mellitus ([place]) 09/17/2015  ? Colon cancer ([place])   ? Colon polyps 09/21/2012  ? Tubular adenoma  ? Diabetes ([place])   ? Hyperlipidemia   ? Hypertension   ? Obesity hypoventilation syndrome ([place])   ? Renal insufficiency   ? Sleep apnea   ? ? ?Past Surgical History:  ?Past Surgical History:  ?Procedure Laterality Date  ? CIRCUMCISION    ? COLON RESECTION    ? COLONOSCOPY N/A 09/21/2012  ? Procedure: COLONOSCOPY;  Surgeon: Inda Castle, MD;  [facility];  Service: Endoscopy;  Laterality: N/A;  ? COLONOSCOPY N/A 12/28/2018  ? Procedure: COLONOSCOPY;  Surgeon: Rogene Houston, MD;  [facility];  Service: Endoscopy;  Laterality: N/A;  ? COLONOSCOPY WITH PROPOFOL N/A 10/17/2020  ? Procedure: COLONOSCOPY WITH PROPOFOL;  Surgeon: Harvel Quale, MD;  [facility];  Service: Gastroenterology;  Laterality: N/A;  ? ENTEROSCOPY N/A 06/24/2020  ? Procedure: ENTEROSCOPY;  Surgeon: Gatha Mayer, MD;  Location: [facility];  Service: Endoscopy;  Laterality: N/A;  ? ESOPHAGOGASTRODUODENOSCOPY N/A 12/28/2018  ? Procedure: ESOPHAGOGASTRODUODENOSCOPY (EGD);  Surgeon: Rogene Houston, MD;  [facility];  Service: Endoscopy;  Laterality: N/A;  ? IR FLUORO GUIDE CV LINE RIGHT  12/10/2020  ? IR US GUIDE VASC ACCESS RIGHT  12/11/2020   ? POLYPECTOMY  10/17/2020  ? Procedure: POLYPECTOMY;  Surgeon: Montez Morita, Quillian Quince, MD;  [facility];  Service: Gastroenterology;;  rectal, transverse, descending, ascending, sigmoid  ? RIGHT HEART CATH N/A 04/11/2019  ? Procedure: RIGHT HEART CATH;  Surgeon: Larey Dresser, MD;  Location: [place] [facility];  Service: Cardiovascular;  Laterality: N/A;  ? US ECHOCARDIOGRAPHY  03/04/2010  ? abnormal study  ? ? ?Allergies:  ?Allergies  ?Allergen Reactions  ? Codeine   ?  Headache ?  ? ? ?Family History:  ?Family History  ?Problem Relation Age of Onset  ? Breast cancer Mother   ? Diabetes Mother   ? Heart attack Father   ? ? ?Social History:  ?Social History  ? ?Tobacco Use  ? Smoking status: Former  ?  Types: Cigarettes  ?  Quit date: 08/06/1983  ?  Years since quitting: 37.6  ? Smokeless tobacco: Never  ?Vaping Use  ? Vaping Use: Never used  ?Substance Use Topics  ? Alcohol use: No  ? Drug use: No  ? ? ?ROS: ?Constitutional:  Negative for fever, chills, weight loss ?CV: Negative for chest pain, previous MI, hypertension ?Respiratory:  Negative for shortness of breath, wheezing, sleep apnea, frequent cough ?GI:  Negative for nausea, vomiting, bloody stool, GERD ? ?Physical exam: ?BP 97/64   Pulse 97   Ht 5' 4"  (1.626 m)   Wt 238 lb (108 kg)   BMI 40.85 kg/m?  ?GENERAL APPEARANCE:  Well appearing, well developed, well nourished, NAD ?HEENT:  Atraumatic, normocephalic, oropharynx clear ?NECK:  Supple without lymphadenopathy or thyromegaly ?ABDOMEN:  Soft, non-tender, no masses ?EXTREMITIES:  Moves all extremities well, without clubbing, cyanosis, or edema ?NEUROLOGIC:  Alert and oriented x 3, in wheelchair, CN II-XII grossly intact ?MENTAL STATUS:  appropriate ?BACK:  Non-tender to palpation, No CVAT ?SKIN:  Warm, dry, and intact ?GU:  mild balanitis ? ?Results: ?No specimen provided ? ?

## 2021-03-06 DIAGNOSIS — N179 Acute kidney failure, unspecified: Secondary | ICD-10-CM | POA: Diagnosis not present

## 2021-03-06 DIAGNOSIS — N2581 Secondary hyperparathyroidism of renal origin: Secondary | ICD-10-CM | POA: Diagnosis not present

## 2021-03-06 DIAGNOSIS — N186 End stage renal disease: Secondary | ICD-10-CM | POA: Diagnosis not present

## 2021-03-06 DIAGNOSIS — D509 Iron deficiency anemia, unspecified: Secondary | ICD-10-CM | POA: Diagnosis not present

## 2021-03-06 DIAGNOSIS — D631 Anemia in chronic kidney disease: Secondary | ICD-10-CM | POA: Diagnosis not present

## 2021-03-06 DIAGNOSIS — Z992 Dependence on renal dialysis: Secondary | ICD-10-CM | POA: Diagnosis not present

## 2021-03-08 ENCOUNTER — Encounter (HOSPITAL_COMMUNITY): Payer: Self-pay | Admitting: Vascular Surgery

## 2021-03-08 DIAGNOSIS — Z9889 Other specified postprocedural states: Secondary | ICD-10-CM | POA: Diagnosis not present

## 2021-03-08 DIAGNOSIS — N390 Urinary tract infection, site not specified: Secondary | ICD-10-CM | POA: Diagnosis not present

## 2021-03-08 DIAGNOSIS — Z992 Dependence on renal dialysis: Secondary | ICD-10-CM | POA: Diagnosis not present

## 2021-03-08 DIAGNOSIS — N186 End stage renal disease: Secondary | ICD-10-CM | POA: Diagnosis not present

## 2021-03-08 DIAGNOSIS — M25511 Pain in right shoulder: Secondary | ICD-10-CM | POA: Diagnosis not present

## 2021-03-09 DIAGNOSIS — N186 End stage renal disease: Secondary | ICD-10-CM | POA: Diagnosis not present

## 2021-03-09 DIAGNOSIS — N179 Acute kidney failure, unspecified: Secondary | ICD-10-CM | POA: Diagnosis not present

## 2021-03-09 DIAGNOSIS — D631 Anemia in chronic kidney disease: Secondary | ICD-10-CM | POA: Diagnosis not present

## 2021-03-09 DIAGNOSIS — M19011 Primary osteoarthritis, right shoulder: Secondary | ICD-10-CM | POA: Diagnosis not present

## 2021-03-09 DIAGNOSIS — M25511 Pain in right shoulder: Secondary | ICD-10-CM | POA: Diagnosis not present

## 2021-03-09 DIAGNOSIS — D509 Iron deficiency anemia, unspecified: Secondary | ICD-10-CM | POA: Diagnosis not present

## 2021-03-09 DIAGNOSIS — N2581 Secondary hyperparathyroidism of renal origin: Secondary | ICD-10-CM | POA: Diagnosis not present

## 2021-03-09 DIAGNOSIS — Z992 Dependence on renal dialysis: Secondary | ICD-10-CM | POA: Diagnosis not present

## 2021-03-10 DIAGNOSIS — E119 Type 2 diabetes mellitus without complications: Secondary | ICD-10-CM | POA: Diagnosis not present

## 2021-03-10 DIAGNOSIS — N186 End stage renal disease: Secondary | ICD-10-CM | POA: Diagnosis not present

## 2021-03-10 DIAGNOSIS — I10 Essential (primary) hypertension: Secondary | ICD-10-CM | POA: Diagnosis not present

## 2021-03-10 DIAGNOSIS — Z992 Dependence on renal dialysis: Secondary | ICD-10-CM | POA: Diagnosis not present

## 2021-03-11 DIAGNOSIS — Z992 Dependence on renal dialysis: Secondary | ICD-10-CM | POA: Diagnosis not present

## 2021-03-11 DIAGNOSIS — D509 Iron deficiency anemia, unspecified: Secondary | ICD-10-CM | POA: Diagnosis not present

## 2021-03-11 DIAGNOSIS — N179 Acute kidney failure, unspecified: Secondary | ICD-10-CM | POA: Diagnosis not present

## 2021-03-11 DIAGNOSIS — D631 Anemia in chronic kidney disease: Secondary | ICD-10-CM | POA: Diagnosis not present

## 2021-03-11 DIAGNOSIS — N2581 Secondary hyperparathyroidism of renal origin: Secondary | ICD-10-CM | POA: Diagnosis not present

## 2021-03-11 DIAGNOSIS — N186 End stage renal disease: Secondary | ICD-10-CM | POA: Diagnosis not present

## 2021-03-13 DIAGNOSIS — N2581 Secondary hyperparathyroidism of renal origin: Secondary | ICD-10-CM | POA: Diagnosis not present

## 2021-03-13 DIAGNOSIS — D631 Anemia in chronic kidney disease: Secondary | ICD-10-CM | POA: Diagnosis not present

## 2021-03-13 DIAGNOSIS — N179 Acute kidney failure, unspecified: Secondary | ICD-10-CM | POA: Diagnosis not present

## 2021-03-13 DIAGNOSIS — D509 Iron deficiency anemia, unspecified: Secondary | ICD-10-CM | POA: Diagnosis not present

## 2021-03-13 DIAGNOSIS — Z992 Dependence on renal dialysis: Secondary | ICD-10-CM | POA: Diagnosis not present

## 2021-03-13 DIAGNOSIS — N186 End stage renal disease: Secondary | ICD-10-CM | POA: Diagnosis not present

## 2021-03-16 DIAGNOSIS — N481 Balanitis: Secondary | ICD-10-CM | POA: Diagnosis not present

## 2021-03-16 DIAGNOSIS — D509 Iron deficiency anemia, unspecified: Secondary | ICD-10-CM | POA: Diagnosis not present

## 2021-03-16 DIAGNOSIS — Z992 Dependence on renal dialysis: Secondary | ICD-10-CM | POA: Diagnosis not present

## 2021-03-16 DIAGNOSIS — N179 Acute kidney failure, unspecified: Secondary | ICD-10-CM | POA: Diagnosis not present

## 2021-03-16 DIAGNOSIS — R52 Pain, unspecified: Secondary | ICD-10-CM | POA: Diagnosis not present

## 2021-03-16 DIAGNOSIS — Z9889 Other specified postprocedural states: Secondary | ICD-10-CM | POA: Diagnosis not present

## 2021-03-16 DIAGNOSIS — Z79899 Other long term (current) drug therapy: Secondary | ICD-10-CM | POA: Diagnosis not present

## 2021-03-16 DIAGNOSIS — D631 Anemia in chronic kidney disease: Secondary | ICD-10-CM | POA: Diagnosis not present

## 2021-03-16 DIAGNOSIS — N2581 Secondary hyperparathyroidism of renal origin: Secondary | ICD-10-CM | POA: Diagnosis not present

## 2021-03-16 DIAGNOSIS — N186 End stage renal disease: Secondary | ICD-10-CM | POA: Diagnosis not present

## 2021-03-17 DIAGNOSIS — R300 Dysuria: Secondary | ICD-10-CM | POA: Diagnosis not present

## 2021-03-17 DIAGNOSIS — Z992 Dependence on renal dialysis: Secondary | ICD-10-CM | POA: Diagnosis not present

## 2021-03-17 DIAGNOSIS — N390 Urinary tract infection, site not specified: Secondary | ICD-10-CM | POA: Diagnosis not present

## 2021-03-17 DIAGNOSIS — N186 End stage renal disease: Secondary | ICD-10-CM | POA: Diagnosis not present

## 2021-03-18 DIAGNOSIS — N2581 Secondary hyperparathyroidism of renal origin: Secondary | ICD-10-CM | POA: Diagnosis not present

## 2021-03-18 DIAGNOSIS — N186 End stage renal disease: Secondary | ICD-10-CM | POA: Diagnosis not present

## 2021-03-18 DIAGNOSIS — N179 Acute kidney failure, unspecified: Secondary | ICD-10-CM | POA: Diagnosis not present

## 2021-03-18 DIAGNOSIS — Z992 Dependence on renal dialysis: Secondary | ICD-10-CM | POA: Diagnosis not present

## 2021-03-18 DIAGNOSIS — D509 Iron deficiency anemia, unspecified: Secondary | ICD-10-CM | POA: Diagnosis not present

## 2021-03-18 DIAGNOSIS — D631 Anemia in chronic kidney disease: Secondary | ICD-10-CM | POA: Diagnosis not present

## 2021-03-18 DIAGNOSIS — N390 Urinary tract infection, site not specified: Secondary | ICD-10-CM | POA: Diagnosis not present

## 2021-03-20 DIAGNOSIS — N186 End stage renal disease: Secondary | ICD-10-CM | POA: Diagnosis not present

## 2021-03-20 DIAGNOSIS — N179 Acute kidney failure, unspecified: Secondary | ICD-10-CM | POA: Diagnosis not present

## 2021-03-20 DIAGNOSIS — D509 Iron deficiency anemia, unspecified: Secondary | ICD-10-CM | POA: Diagnosis not present

## 2021-03-20 DIAGNOSIS — Z992 Dependence on renal dialysis: Secondary | ICD-10-CM | POA: Diagnosis not present

## 2021-03-20 DIAGNOSIS — N2581 Secondary hyperparathyroidism of renal origin: Secondary | ICD-10-CM | POA: Diagnosis not present

## 2021-03-20 DIAGNOSIS — D631 Anemia in chronic kidney disease: Secondary | ICD-10-CM | POA: Diagnosis not present

## 2021-03-22 DIAGNOSIS — R300 Dysuria: Secondary | ICD-10-CM | POA: Diagnosis not present

## 2021-03-22 DIAGNOSIS — N390 Urinary tract infection, site not specified: Secondary | ICD-10-CM | POA: Diagnosis not present

## 2021-03-22 DIAGNOSIS — Z79899 Other long term (current) drug therapy: Secondary | ICD-10-CM | POA: Diagnosis not present

## 2021-03-23 DIAGNOSIS — D631 Anemia in chronic kidney disease: Secondary | ICD-10-CM | POA: Diagnosis not present

## 2021-03-23 DIAGNOSIS — D509 Iron deficiency anemia, unspecified: Secondary | ICD-10-CM | POA: Diagnosis not present

## 2021-03-23 DIAGNOSIS — N186 End stage renal disease: Secondary | ICD-10-CM | POA: Diagnosis not present

## 2021-03-23 DIAGNOSIS — N179 Acute kidney failure, unspecified: Secondary | ICD-10-CM | POA: Diagnosis not present

## 2021-03-23 DIAGNOSIS — Z992 Dependence on renal dialysis: Secondary | ICD-10-CM | POA: Diagnosis not present

## 2021-03-23 DIAGNOSIS — N2581 Secondary hyperparathyroidism of renal origin: Secondary | ICD-10-CM | POA: Diagnosis not present

## 2021-03-24 DIAGNOSIS — H524 Presbyopia: Secondary | ICD-10-CM | POA: Diagnosis not present

## 2021-03-24 DIAGNOSIS — H52223 Regular astigmatism, bilateral: Secondary | ICD-10-CM | POA: Diagnosis not present

## 2021-03-24 DIAGNOSIS — E119 Type 2 diabetes mellitus without complications: Secondary | ICD-10-CM | POA: Diagnosis not present

## 2021-03-24 DIAGNOSIS — H5203 Hypermetropia, bilateral: Secondary | ICD-10-CM | POA: Diagnosis not present

## 2021-03-24 DIAGNOSIS — H2513 Age-related nuclear cataract, bilateral: Secondary | ICD-10-CM | POA: Diagnosis not present

## 2021-03-25 DIAGNOSIS — N179 Acute kidney failure, unspecified: Secondary | ICD-10-CM | POA: Diagnosis not present

## 2021-03-25 DIAGNOSIS — D509 Iron deficiency anemia, unspecified: Secondary | ICD-10-CM | POA: Diagnosis not present

## 2021-03-25 DIAGNOSIS — D631 Anemia in chronic kidney disease: Secondary | ICD-10-CM | POA: Diagnosis not present

## 2021-03-25 DIAGNOSIS — N186 End stage renal disease: Secondary | ICD-10-CM | POA: Diagnosis not present

## 2021-03-25 DIAGNOSIS — N2581 Secondary hyperparathyroidism of renal origin: Secondary | ICD-10-CM | POA: Diagnosis not present

## 2021-03-25 DIAGNOSIS — Z992 Dependence on renal dialysis: Secondary | ICD-10-CM | POA: Diagnosis not present

## 2021-03-27 DIAGNOSIS — D631 Anemia in chronic kidney disease: Secondary | ICD-10-CM | POA: Diagnosis not present

## 2021-03-27 DIAGNOSIS — D509 Iron deficiency anemia, unspecified: Secondary | ICD-10-CM | POA: Diagnosis not present

## 2021-03-27 DIAGNOSIS — N179 Acute kidney failure, unspecified: Secondary | ICD-10-CM | POA: Diagnosis not present

## 2021-03-27 DIAGNOSIS — N2581 Secondary hyperparathyroidism of renal origin: Secondary | ICD-10-CM | POA: Diagnosis not present

## 2021-03-27 DIAGNOSIS — Z992 Dependence on renal dialysis: Secondary | ICD-10-CM | POA: Diagnosis not present

## 2021-03-27 DIAGNOSIS — N186 End stage renal disease: Secondary | ICD-10-CM | POA: Diagnosis not present

## 2021-03-29 ENCOUNTER — Ambulatory Visit (INDEPENDENT_AMBULATORY_CARE_PROVIDER_SITE_OTHER): Payer: Medicare Other | Admitting: Physician Assistant

## 2021-03-29 ENCOUNTER — Other Ambulatory Visit: Payer: Self-pay

## 2021-03-29 VITALS — BP 101/64 | HR 95 | Ht 64.0 in | Wt 238.0 lb

## 2021-03-29 DIAGNOSIS — N186 End stage renal disease: Secondary | ICD-10-CM

## 2021-03-29 DIAGNOSIS — Z992 Dependence on renal dialysis: Secondary | ICD-10-CM

## 2021-03-29 DIAGNOSIS — R310 Gross hematuria: Secondary | ICD-10-CM

## 2021-03-29 DIAGNOSIS — N481 Balanitis: Secondary | ICD-10-CM | POA: Diagnosis not present

## 2021-03-29 LAB — BLADDER SCAN AMB NON-IMAGING: Scan Result: 129

## 2021-03-29 NOTE — Progress Notes (Signed)
post void residual=179m ?

## 2021-03-29 NOTE — Progress Notes (Signed)
? ?Assessment: ?1. Gross hematuria   ?2. Balanitis   ?3. ESRD (end stage renal disease) on dialysis [facility])   ? ? ?Plan: ?Nystatin prescription renewed and orders given to nursing facility to apply twice daily for recurrent balanitis.  Order given for them to obtain a urine for urinalysis and send culture results to [place].  He is advised to keep his appointment with Dr. Felipa Eth as already scheduled. ? ?Chief Complaint: ?No chief complaint on file. ? ? ?HPI: ?William Flores is a 71 y.o. male who presents for complaint of mild urinary burning with a history of gross hematuria.  No gross hematuria at this time and no other complaints or changes since his last visit. He also requests refills of nystatin cream for history of balanitis.  He is unable to give a urine specimen today.  ?IPSS = 25 ?. ? ?03/05/21 ?William Flores is a 71 y.o. year old male who is seen for further evaluation of gross hematuria.  The patient has multiple medical problems and is currently on hemodialysis for end-stage renal disease.  He had an episode of gross hematuria in January 2023.  This was associated with dysuria.  No flank or abdominal pain.  No lab results available for review.  He was told he had a UTI and treated with antibiotics.  The gross hematuria subsequently resolved.  He was not having any dysuria or other urinary symptoms at his visit on 02/02/21.  He voids approximately twice per day while on hemodialysis.  No history of UTIs or kidney stones.    ?He does have a remote history of tobacco use but has not smoked for over 30 years. ?CT imaging from 10/22 showed no renal or ureteral calculi and no evidence of obstruction. ?IPSS = 4. ?He was found to have balanitis on physical examination.  He was treated with nystatin cream. ?  ?He returns today for follow-up.  He reports no further episodes of gross hematuria.  He reports that a urine sample was obtained at his nursing facility.  He was told that he had some evidence of a UTI and was  treated with some antibiotics.  No records available regarding his urine studies.  He does report some occasional dysuria.  He reports that the balanitis has improved. ? ?Portions of the above documentation were copied from a prior visit for review purposes only. ? ?Allergies: ?Allergies  ?Allergen Reactions  ? Codeine   ?  Headache ?  ? ? ?PMH: ?Past Medical History:  ?Diagnosis Date  ? CAD (coronary artery disease)   ? LHC 2/08:  mRCA 50%, o/w no sig CAD, EF 25%  ? Cardiomyopathy ([place])   ? EF 35-40% in 2/16 in setting of AF with RVR >> echo 5/16 with improved LVF with EF 65-70%  ? Chronic atrial fibrillation (HCC)   ? Chronic diastolic heart failure ([place])   ? HFPF - EF ~65-70% (OFTEN EXACERBATED BY AFIB)  ? CKD (chronic kidney disease)   ? hx of worsening renal failure and hyperK+ in setting of acute diast CHF >> required CVVHD in 4/16 and dialysis x 1 in 5/16  ? CKD stage 3 due to type 2 diabetes mellitus ([place]) 09/17/2015  ? Colon cancer ([place])   ? Colon polyps 09/21/2012  ? Tubular adenoma  ? Diabetes ([place])   ? Hyperlipidemia   ? Hypertension   ? Obesity hypoventilation syndrome ([place])   ? Renal insufficiency   ? Sleep apnea   ? ? ?PSH: ?Past  Surgical History:  ?Procedure Laterality Date  ? AV FISTULA PLACEMENT Right 03/02/2021  ? Procedure: RIGHT ARM ARTERIOVENOUS (AV) FISTULA CREATION;  Surgeon: [name], MD;  [facility];  Service: Vascular;  Laterality: Right;  ? CIRCUMCISION    ? COLON RESECTION    ? COLONOSCOPY N/A 09/21/2012  ? Procedure: COLONOSCOPY;  Surgeon: Inda Castle, MD;  [facility];  Service: Endoscopy;  Laterality: N/A;  ? COLONOSCOPY N/A 12/28/2018  ? Procedure: COLONOSCOPY;  Surgeon: Rogene Houston, MD;  [facility];  Service: Endoscopy;  Laterality: N/A;  ? COLONOSCOPY WITH PROPOFOL N/A 10/17/2020  ? Procedure: COLONOSCOPY WITH PROPOFOL;  Surgeon: Harvel Quale, MD;  [facility];  Service: Gastroenterology;  Laterality: N/A;  ?  ENTEROSCOPY N/A 06/24/2020  ? Procedure: ENTEROSCOPY;  Surgeon: Gatha Mayer, MD;  Location: [facility];  Service: Endoscopy;  Laterality: N/A;  ? ESOPHAGOGASTRODUODENOSCOPY N/A 12/28/2018  ? Procedure: ESOPHAGOGASTRODUODENOSCOPY (EGD);  Surgeon: Rogene Houston, MD;  [facility];  Service: Endoscopy;  Laterality: N/A;  ? IR FLUORO GUIDE CV LINE RIGHT  12/10/2020  ? IR US GUIDE VASC ACCESS RIGHT  12/11/2020  ? POLYPECTOMY  10/17/2020  ? Procedure: POLYPECTOMY;  Surgeon: Montez Morita, Quillian Quince, MD;  [facility];  Service: Gastroenterology;;  rectal, transverse, descending, ascending, sigmoid  ? RIGHT HEART CATH N/A 04/11/2019  ? Procedure: RIGHT HEART CATH;  Surgeon: Larey Dresser, MD;  Location: [place] [facility];  Service: Cardiovascular;  Laterality: N/A;  ? US ECHOCARDIOGRAPHY  03/04/2010  ? abnormal study  ? ? ?SH: ?Social History  ? ?Tobacco Use  ? Smoking status: Former  ?  Types: Cigarettes  ?  Quit date: 08/06/1983  ?  Years since quitting: 37.6  ? Smokeless tobacco: Never  ?Vaping Use  ? Vaping Use: Never used  ?Substance Use Topics  ? Alcohol use: No  ? Drug use: No  ? ? ?ROS: ?Constitutional:  Negative for fever, chills, weight loss ?CV: Negative for chest pain ?Respiratory:  Negative for shortness of breath ?GI:  Negative for nausea, vomiting, bloody stool, GERD ? ?PE: ?BP 101/64   Pulse 95   Ht 5' 4"  (1.626 m)   Wt 238 lb (108 kg)   BMI 40.85 kg/m?  ?GENERAL APPEARANCE:  Well appearing, well developed, well nourished, NAD ?HEENT:  Atraumatic, normocephalic ?NECK:  Supple. Trachea midline ?ABDOMEN:  Soft, non-tender, no masses ?NEUROLOGIC:  Alert and oriented x 3 ?MENTAL STATUS:  appropriate ?BACK: No CVAT ?SKIN:  Warm, dry, and intact ? ? ?Results: ?Laboratory Data: ?Lab Results  ?Component Value Date  ? WBC 9.0 12/11/2020  ? HGB 10.2 (L) 03/02/2021  ? HCT 30.0 (L) 03/02/2021  ? MCV 92.8 12/11/2020  ? PLT 116 (L) 12/11/2020  ? ? ?Lab Results  ?Component Value Date  ?  CREATININE 8.30 (H) 03/02/2021  ? ? ?Lab Results  ?Component Value Date  ? HGBA1C 7.1 (H) 10/14/2020  ? ? ?Urinalysis ?   ?Component Value Date/Time  ? COLORURINE YELLOW 06/21/2020 1059  ? APPEARANCEUR CLEAR 06/21/2020 1059  ? LABSPEC 1.020 06/21/2020 1059  ? PHURINE 5.5 06/21/2020 1059  ? GLUCOSEU NEGATIVE 06/21/2020 1059  ? HGBUR LARGE (A) 06/21/2020 1059  ? [place] NEGATIVE 06/21/2020 1059  ? [place] NEGATIVE 06/21/2020 1059  ? PROTEINUR 100 (A) 06/21/2020 1059  ? UROBILINOGEN 0.2 05/16/2014 0822  ? NITRITE NEGATIVE 06/21/2020 1059  ? LEUKOCYTESUR NEGATIVE 06/21/2020 1059  ? ? ?Lab Results  ?  Component Value Date  ? LABMICR 598.7 05/28/2019  ? BACTERIA NONE SEEN 06/21/2020  ? ? ?Pertinent Imaging: ?Results for orders placed in visit on 10/28/13 ? ?DG Abd 1 View ? ?Narrative ?CLINICAL DATA:  Abdominal swelling ? ?EXAM: ?ABDOMEN - 1 VIEW ? ?COMPARISON:  05/27/2004 ? ?FINDINGS: ?The images are not marked supine or upright. Limited image quality ?due to large patient size ? ?Normal bowel gas pattern.  No acute abnormality identified. ? ?IMPRESSION: ?Negative ? ? ?Electronically Signed ?By: Franchot Gallo M.D. ?On: 10/29/2013 08:30 ? ?No results found for this or any previous visit. ? ?No results found for this or any previous visit. ? ?No results found for this or any previous visit. ? ?Results for orders placed during the hospital encounter of 06/19/20 ? ?US RENAL ? ?Narrative ?CLINICAL DATA:  Acute kidney injury. Patient with history of chronic ?kidney disease. ? ?EXAM: ?RENAL / URINARY TRACT ULTRASOUND COMPLETE ? ?COMPARISON:  Noncontrast abdominal CT 12/26/2018 ? ?FINDINGS: ?Right Kidney: ? ?Renal measurements: 13.6 x 7.1 x 7.3 cm = volume: 360 mL. Lobulated ?renal contours with mild parenchymal thinning. No hydronephrosis. No ?evidence of stone or focal renal lesion. ? ?Left Kidney: ? ?Renal measurements: 12.8 x 8.0 x 7.5 cm = volume: 402 mL. Mild ?parenchymal thinning. No hydronephrosis. Technically  limited ?evaluation due to habitus and patient's difficulty with breath hold. ?No obvious focal lesion. ? ?Bladder: ? ?Appears normal for degree of bladder distention. ? ?Other: ? ?None. ? ?IMPRESSION: ?1. No hydronephro

## 2021-03-30 DIAGNOSIS — N390 Urinary tract infection, site not specified: Secondary | ICD-10-CM | POA: Diagnosis not present

## 2021-03-30 DIAGNOSIS — N2581 Secondary hyperparathyroidism of renal origin: Secondary | ICD-10-CM | POA: Diagnosis not present

## 2021-03-30 DIAGNOSIS — D509 Iron deficiency anemia, unspecified: Secondary | ICD-10-CM | POA: Diagnosis not present

## 2021-03-30 DIAGNOSIS — N186 End stage renal disease: Secondary | ICD-10-CM | POA: Diagnosis not present

## 2021-03-30 DIAGNOSIS — N179 Acute kidney failure, unspecified: Secondary | ICD-10-CM | POA: Diagnosis not present

## 2021-03-30 DIAGNOSIS — D631 Anemia in chronic kidney disease: Secondary | ICD-10-CM | POA: Diagnosis not present

## 2021-03-30 DIAGNOSIS — Z992 Dependence on renal dialysis: Secondary | ICD-10-CM | POA: Diagnosis not present

## 2021-03-31 ENCOUNTER — Encounter: Payer: Self-pay | Admitting: Vascular Surgery

## 2021-03-31 ENCOUNTER — Ambulatory Visit (INDEPENDENT_AMBULATORY_CARE_PROVIDER_SITE_OTHER): Payer: Medicare Other | Admitting: Vascular Surgery

## 2021-03-31 VITALS — BP 109/75 | HR 82 | Ht 64.0 in | Wt 237.4 lb

## 2021-03-31 DIAGNOSIS — N186 End stage renal disease: Secondary | ICD-10-CM

## 2021-03-31 DIAGNOSIS — Z992 Dependence on renal dialysis: Secondary | ICD-10-CM

## 2021-03-31 NOTE — Progress Notes (Signed)
? ?[facility] [place] ? ?Patient name: William Flores MRN: [id number] DOB: 1950-07-23 Sex: male ? ?REASON FOR VISIT: Follow-up right brachiocephalic AV fistula creation on 03/02/2021 ? ?HPI: ?William Flores is a 71 y.o. male here today for follow-up.  He is here today from [facility] facility.  He continues to have hemodialysis via right IJ catheter without difficulty.  He reports some swelling in his right hand following placement of his fistula but denies any steal symptoms.  He has had resolution of the soreness around the incisions ? ?Current Outpatient Medications  ?Medication Sig Dispense Refill  ? acetaminophen (TYLENOL) 160 MG/5ML liquid Take 650 mg by mouth every 4 (four) hours as needed for fever.    ? allopurinol (ZYLOPRIM) 100 MG tablet Take 200 mg by mouth daily.    ? Amino Acids-Protein Hydrolys (PRO-STAT) LIQD Take 30 mLs by mouth daily.    ? amoxicillin-clavulanate (AUGMENTIN) 875-125 MG tablet Take 1 tablet by mouth 2 (two) times daily.    ? apixaban (ELIQUIS) 5 MG TABS tablet Take 1 tablet (5 mg total) by mouth 2 (two) times daily. 60 tablet   ? atorvastatin (LIPITOR) 20 MG tablet TAKE 1 TABLET BY MOUTH EVERY DAY (Patient taking differently: Take 20 mg by mouth at bedtime.) 90 tablet 3  ? B Complex-C (B-COMPLEX WITH VITAMIN C) tablet Take 1 tablet by mouth daily.    ? Cholecalciferol (VITAMIN D) 125 MCG (5000 UT) CAPS Take 5,000 Units by mouth daily.    ? ferrous sulfate 325 (65 FE) MG tablet Take 325 mg by mouth daily with breakfast.    ? glucose blood (CONTOUR NEXT TEST) test strip Test BS TID Dx E11.29 300 strip 3  ? guaiFENesin (ROBITUSSIN) 100 MG/5ML liquid Take 5 mLs by mouth every 4 (four) hours as needed for cough or to loosen phlegm.    ? insulin aspart (NOVOLOG) 100 UNIT/ML injection Before each meal 3 times a day, 140-199 - 2 units, 200-250 - 4 units, 251-299 - 6 units,  300-349 - 8 units,  350 or above 10 units. ?Insulin  PEN if approved, provide syringes and needles if needed. 10 mL 0  ? insulin detemir (LEVEMIR) 100 UNIT/ML injection Inject 16 Units into the skin at bedtime.    ? Insulin Pen Needle 32G X 4 MM MISC Use to give insulin daily Dx E11.29 100 each 3  ? lactulose (CHRONULAC) 10 GM/15ML solution Take 30 mLs (20 g total) by mouth 2 (two) times daily. (Patient taking differently: Take 20 g by mouth daily.) 236 mL 0  ? levothyroxine (SYNTHROID) 100 MCG tablet Take 100 mcg by mouth every morning.    ? melatonin 5 MG TABS Take 5 mg by mouth at bedtime as needed (insomnia).    ? metoprolol succinate (TOPROL-XL) 25 MG 24 hr tablet Take 25 mg by mouth daily.    ? Nutritional Supplements (NOVASOURCE RENAL) LIQD Take 240 mLs by mouth at bedtime.    ? ondansetron (ZOFRAN-ODT) 4 MG disintegrating tablet Take 4 mg by mouth every 8 (eight) hours as needed for nausea or vomiting.    ? oxyCODONE (OXY IR/ROXICODONE) 5 MG immediate release tablet Take 1 tablet (5 mg total) by mouth every 6 (six) hours as needed for moderate pain or severe pain. 5 tablet 0  ? OXYGEN Inhale 2 L into the lungs continuous.    ? pantoprazole (PROTONIX) 40 MG tablet TAKE 1 TABLET BY MOUTH EVERY DAY 90 tablet 1  ?  polyethylene glycol (MIRALAX / GLYCOLAX) 17 g packet Take 17 g by mouth daily. 14 each 0  ? potassium chloride SA (KLOR-CON M) 20 MEQ tablet Take 20 mEq by mouth daily.    ? sildenafil (REVATIO) 20 MG tablet Take 1 tablet (20 mg total) by mouth 3 (three) times daily. 90 tablet 11  ? traMADol (ULTRAM) 50 MG tablet Take 1 tablet (50 mg total) by mouth every 6 (six) hours as needed. 10 tablet 0  ? insulin glargine-yfgn (SEMGLEE) 100 UNIT/ML Pen Inject 16 Units into the skin at bedtime. (Patient not taking: Reported on 03/31/2021)    ? Zinc Oxide (TRIPLE PASTE) 12.8 % ointment Apply topically 2 (two) times daily. 56.7 g 0  ? ?No current facility-administered medications for this visit.  ? ? ? ?PHYSICAL EXAM: ?Vitals:  ? 03/31/21 1344  ?BP: 109/75  ?Pulse: 82   ?Weight: 237 lb 6.4 oz (107.7 kg)  ?Height: 5' 4"  (1.626 m)  ? ? ?GENERAL: The patient is a well-nourished male, in no acute distress. The vital signs are documented above. ?Good healing of his surgical incision at the antecubital space.  Excellent Abel Hageman maturation of his right upper arm AV fistula.  The vein is of good caliber and is also close to the surface.  He does have some moderate tortuosity ? ?MEDICAL ISSUES: ?Excellent Dorsey Charette result of AV fistula now 1 month postop.  I would wait a total of 3 months and should be able to use his fistula after 05/30/2021.  We are then available to remove his tunneled catheter after several successful treatment sessions with his right upper arm fistula.  He does not need to hold his anticoagulant for catheter removal ? ? ?[name], MD FACS ?[facility] [place] ?Office Tel [phone] ? ?Note: Portions of this report may have been transcribed using voice recognition software.  Every effort has been made to ensure accuracy; however, inadvertent computerized transcription errors may still be present. ?

## 2021-04-01 DIAGNOSIS — N186 End stage renal disease: Secondary | ICD-10-CM | POA: Diagnosis not present

## 2021-04-01 DIAGNOSIS — E119 Type 2 diabetes mellitus without complications: Secondary | ICD-10-CM | POA: Diagnosis not present

## 2021-04-01 DIAGNOSIS — N179 Acute kidney failure, unspecified: Secondary | ICD-10-CM | POA: Diagnosis not present

## 2021-04-01 DIAGNOSIS — N2581 Secondary hyperparathyroidism of renal origin: Secondary | ICD-10-CM | POA: Diagnosis not present

## 2021-04-01 DIAGNOSIS — Z992 Dependence on renal dialysis: Secondary | ICD-10-CM | POA: Diagnosis not present

## 2021-04-01 DIAGNOSIS — D631 Anemia in chronic kidney disease: Secondary | ICD-10-CM | POA: Diagnosis not present

## 2021-04-01 DIAGNOSIS — D509 Iron deficiency anemia, unspecified: Secondary | ICD-10-CM | POA: Diagnosis not present

## 2021-04-02 DIAGNOSIS — N186 End stage renal disease: Secondary | ICD-10-CM | POA: Diagnosis not present

## 2021-04-02 DIAGNOSIS — Z992 Dependence on renal dialysis: Secondary | ICD-10-CM | POA: Diagnosis not present

## 2021-04-03 DIAGNOSIS — N250 Renal osteodystrophy: Secondary | ICD-10-CM | POA: Diagnosis not present

## 2021-04-03 DIAGNOSIS — D509 Iron deficiency anemia, unspecified: Secondary | ICD-10-CM | POA: Diagnosis not present

## 2021-04-03 DIAGNOSIS — N186 End stage renal disease: Secondary | ICD-10-CM | POA: Diagnosis not present

## 2021-04-03 DIAGNOSIS — Z992 Dependence on renal dialysis: Secondary | ICD-10-CM | POA: Diagnosis not present

## 2021-04-03 DIAGNOSIS — D631 Anemia in chronic kidney disease: Secondary | ICD-10-CM | POA: Diagnosis not present

## 2021-04-06 DIAGNOSIS — D509 Iron deficiency anemia, unspecified: Secondary | ICD-10-CM | POA: Diagnosis not present

## 2021-04-06 DIAGNOSIS — Z992 Dependence on renal dialysis: Secondary | ICD-10-CM | POA: Diagnosis not present

## 2021-04-06 DIAGNOSIS — N186 End stage renal disease: Secondary | ICD-10-CM | POA: Diagnosis not present

## 2021-04-06 DIAGNOSIS — D631 Anemia in chronic kidney disease: Secondary | ICD-10-CM | POA: Diagnosis not present

## 2021-04-06 DIAGNOSIS — N250 Renal osteodystrophy: Secondary | ICD-10-CM | POA: Diagnosis not present

## 2021-04-08 ENCOUNTER — Telehealth: Payer: Self-pay

## 2021-04-08 DIAGNOSIS — Z992 Dependence on renal dialysis: Secondary | ICD-10-CM | POA: Diagnosis not present

## 2021-04-08 DIAGNOSIS — N250 Renal osteodystrophy: Secondary | ICD-10-CM | POA: Diagnosis not present

## 2021-04-08 DIAGNOSIS — N186 End stage renal disease: Secondary | ICD-10-CM | POA: Diagnosis not present

## 2021-04-08 DIAGNOSIS — D631 Anemia in chronic kidney disease: Secondary | ICD-10-CM | POA: Diagnosis not present

## 2021-04-08 DIAGNOSIS — D509 Iron deficiency anemia, unspecified: Secondary | ICD-10-CM | POA: Diagnosis not present

## 2021-04-08 NOTE — Chronic Care Management (AMB) (Signed)
?  Care Management  ? ?Outreach Note ? ?04/08/2021 ?Name: William Flores MRN: [id number] DOB: 1950-12-18 ? ?Referred by: [name], [name], MD ?Reason for referral : Care Coordination (Outreach to schedule initial with RN CM from HR list ) ? ? ?An unsuccessful telephone outreach was attempted today. The patient was referred to the case management team for assistance with care management and care coordination.  ? ?Follow Up Plan:  ?A HIPAA compliant phone message was left for the patient providing contact information and requesting a return call.  ?The care management team will reach out to the patient again over the next 7 days.  ?If patient returns call to provider office, please advise to call [place] * at [phone]* ? ?Noreene Larsson, RMA ?Care Guide, Embedded Care Coordination ?[facility]  [facility]  ?[place], [place] [address] ?Direct Dial: [phone] ?[email or web address] ?Website: [email or web address]  ? ?

## 2021-04-10 DIAGNOSIS — D631 Anemia in chronic kidney disease: Secondary | ICD-10-CM | POA: Diagnosis not present

## 2021-04-10 DIAGNOSIS — Z992 Dependence on renal dialysis: Secondary | ICD-10-CM | POA: Diagnosis not present

## 2021-04-10 DIAGNOSIS — D509 Iron deficiency anemia, unspecified: Secondary | ICD-10-CM | POA: Diagnosis not present

## 2021-04-10 DIAGNOSIS — N186 End stage renal disease: Secondary | ICD-10-CM | POA: Diagnosis not present

## 2021-04-10 DIAGNOSIS — N250 Renal osteodystrophy: Secondary | ICD-10-CM | POA: Diagnosis not present

## 2021-04-13 DIAGNOSIS — N250 Renal osteodystrophy: Secondary | ICD-10-CM | POA: Diagnosis not present

## 2021-04-13 DIAGNOSIS — D509 Iron deficiency anemia, unspecified: Secondary | ICD-10-CM | POA: Diagnosis not present

## 2021-04-13 DIAGNOSIS — N186 End stage renal disease: Secondary | ICD-10-CM | POA: Diagnosis not present

## 2021-04-13 DIAGNOSIS — Z992 Dependence on renal dialysis: Secondary | ICD-10-CM | POA: Diagnosis not present

## 2021-04-13 DIAGNOSIS — D631 Anemia in chronic kidney disease: Secondary | ICD-10-CM | POA: Diagnosis not present

## 2021-04-14 ENCOUNTER — Other Ambulatory Visit: Payer: Self-pay

## 2021-04-14 DIAGNOSIS — R1311 Dysphagia, oral phase: Secondary | ICD-10-CM | POA: Diagnosis not present

## 2021-04-14 DIAGNOSIS — M6281 Muscle weakness (generalized): Secondary | ICD-10-CM | POA: Diagnosis not present

## 2021-04-14 DIAGNOSIS — R262 Difficulty in walking, not elsewhere classified: Secondary | ICD-10-CM | POA: Diagnosis not present

## 2021-04-15 DIAGNOSIS — D509 Iron deficiency anemia, unspecified: Secondary | ICD-10-CM | POA: Diagnosis not present

## 2021-04-15 DIAGNOSIS — D631 Anemia in chronic kidney disease: Secondary | ICD-10-CM | POA: Diagnosis not present

## 2021-04-15 DIAGNOSIS — N186 End stage renal disease: Secondary | ICD-10-CM | POA: Diagnosis not present

## 2021-04-15 DIAGNOSIS — N250 Renal osteodystrophy: Secondary | ICD-10-CM | POA: Diagnosis not present

## 2021-04-15 DIAGNOSIS — Z992 Dependence on renal dialysis: Secondary | ICD-10-CM | POA: Diagnosis not present

## 2021-04-16 DIAGNOSIS — R262 Difficulty in walking, not elsewhere classified: Secondary | ICD-10-CM | POA: Diagnosis not present

## 2021-04-16 DIAGNOSIS — M6281 Muscle weakness (generalized): Secondary | ICD-10-CM | POA: Diagnosis not present

## 2021-04-16 DIAGNOSIS — R1311 Dysphagia, oral phase: Secondary | ICD-10-CM | POA: Diagnosis not present

## 2021-04-17 DIAGNOSIS — D631 Anemia in chronic kidney disease: Secondary | ICD-10-CM | POA: Diagnosis not present

## 2021-04-17 DIAGNOSIS — Z992 Dependence on renal dialysis: Secondary | ICD-10-CM | POA: Diagnosis not present

## 2021-04-17 DIAGNOSIS — N186 End stage renal disease: Secondary | ICD-10-CM | POA: Diagnosis not present

## 2021-04-17 DIAGNOSIS — N250 Renal osteodystrophy: Secondary | ICD-10-CM | POA: Diagnosis not present

## 2021-04-17 DIAGNOSIS — D509 Iron deficiency anemia, unspecified: Secondary | ICD-10-CM | POA: Diagnosis not present

## 2021-04-19 DIAGNOSIS — M6281 Muscle weakness (generalized): Secondary | ICD-10-CM | POA: Diagnosis not present

## 2021-04-19 DIAGNOSIS — R262 Difficulty in walking, not elsewhere classified: Secondary | ICD-10-CM | POA: Diagnosis not present

## 2021-04-19 DIAGNOSIS — R1311 Dysphagia, oral phase: Secondary | ICD-10-CM | POA: Diagnosis not present

## 2021-04-20 DIAGNOSIS — N186 End stage renal disease: Secondary | ICD-10-CM | POA: Diagnosis not present

## 2021-04-20 DIAGNOSIS — D631 Anemia in chronic kidney disease: Secondary | ICD-10-CM | POA: Diagnosis not present

## 2021-04-20 DIAGNOSIS — N250 Renal osteodystrophy: Secondary | ICD-10-CM | POA: Diagnosis not present

## 2021-04-20 DIAGNOSIS — Z992 Dependence on renal dialysis: Secondary | ICD-10-CM | POA: Diagnosis not present

## 2021-04-20 DIAGNOSIS — D509 Iron deficiency anemia, unspecified: Secondary | ICD-10-CM | POA: Diagnosis not present

## 2021-04-21 DIAGNOSIS — I10 Essential (primary) hypertension: Secondary | ICD-10-CM | POA: Diagnosis not present

## 2021-04-21 DIAGNOSIS — R262 Difficulty in walking, not elsewhere classified: Secondary | ICD-10-CM | POA: Diagnosis not present

## 2021-04-21 DIAGNOSIS — Z992 Dependence on renal dialysis: Secondary | ICD-10-CM | POA: Diagnosis not present

## 2021-04-21 DIAGNOSIS — R1311 Dysphagia, oral phase: Secondary | ICD-10-CM | POA: Diagnosis not present

## 2021-04-21 DIAGNOSIS — E119 Type 2 diabetes mellitus without complications: Secondary | ICD-10-CM | POA: Diagnosis not present

## 2021-04-21 DIAGNOSIS — M6281 Muscle weakness (generalized): Secondary | ICD-10-CM | POA: Diagnosis not present

## 2021-04-21 DIAGNOSIS — N186 End stage renal disease: Secondary | ICD-10-CM | POA: Diagnosis not present

## 2021-04-22 ENCOUNTER — Inpatient Hospital Stay (HOSPITAL_COMMUNITY)
Admission: EM | Admit: 2021-04-22 | Discharge: 2021-04-26 | DRG: 377 | Disposition: A | Discharge status: Skilled Nursing Facility | Payer: Medicare Other | Source: Skilled Nursing Facility | Attending: Family Medicine | Admitting: Family Medicine

## 2021-04-22 ENCOUNTER — Other Ambulatory Visit: Payer: Self-pay

## 2021-04-22 ENCOUNTER — Encounter (HOSPITAL_COMMUNITY): Payer: Self-pay | Admitting: *Deleted

## 2021-04-22 DIAGNOSIS — Z992 Dependence on renal dialysis: Secondary | ICD-10-CM

## 2021-04-22 DIAGNOSIS — R5381 Other malaise: Secondary | ICD-10-CM | POA: Diagnosis not present

## 2021-04-22 DIAGNOSIS — K644 Residual hemorrhoidal skin tags: Secondary | ICD-10-CM | POA: Diagnosis present

## 2021-04-22 DIAGNOSIS — M898X9 Other specified disorders of bone, unspecified site: Secondary | ICD-10-CM | POA: Diagnosis present

## 2021-04-22 DIAGNOSIS — E662 Morbid (severe) obesity with alveolar hypoventilation: Secondary | ICD-10-CM | POA: Diagnosis not present

## 2021-04-22 DIAGNOSIS — K7460 Unspecified cirrhosis of liver: Secondary | ICD-10-CM | POA: Diagnosis present

## 2021-04-22 DIAGNOSIS — I2510 Atherosclerotic heart disease of native coronary artery without angina pectoris: Secondary | ICD-10-CM | POA: Diagnosis present

## 2021-04-22 DIAGNOSIS — K7581 Nonalcoholic steatohepatitis (NASH): Secondary | ICD-10-CM | POA: Diagnosis present

## 2021-04-22 DIAGNOSIS — R7889 Finding of other specified substances, not normally found in blood: Secondary | ICD-10-CM | POA: Diagnosis not present

## 2021-04-22 DIAGNOSIS — R770 Abnormality of albumin: Secondary | ICD-10-CM | POA: Diagnosis not present

## 2021-04-22 DIAGNOSIS — I272 Pulmonary hypertension, unspecified: Secondary | ICD-10-CM | POA: Diagnosis not present

## 2021-04-22 DIAGNOSIS — I429 Cardiomyopathy, unspecified: Secondary | ICD-10-CM | POA: Diagnosis not present

## 2021-04-22 DIAGNOSIS — I952 Hypotension due to drugs: Secondary | ICD-10-CM | POA: Diagnosis present

## 2021-04-22 DIAGNOSIS — E1165 Type 2 diabetes mellitus with hyperglycemia: Secondary | ICD-10-CM | POA: Diagnosis not present

## 2021-04-22 DIAGNOSIS — I959 Hypotension, unspecified: Secondary | ICD-10-CM | POA: Diagnosis not present

## 2021-04-22 DIAGNOSIS — I10 Essential (primary) hypertension: Secondary | ICD-10-CM | POA: Diagnosis present

## 2021-04-22 DIAGNOSIS — K922 Gastrointestinal hemorrhage, unspecified: Secondary | ICD-10-CM | POA: Diagnosis present

## 2021-04-22 DIAGNOSIS — Z794 Long term (current) use of insulin: Secondary | ICD-10-CM

## 2021-04-22 DIAGNOSIS — D631 Anemia in chronic kidney disease: Secondary | ICD-10-CM | POA: Diagnosis present

## 2021-04-22 DIAGNOSIS — D509 Iron deficiency anemia, unspecified: Secondary | ICD-10-CM | POA: Diagnosis not present

## 2021-04-22 DIAGNOSIS — K219 Gastro-esophageal reflux disease without esophagitis: Secondary | ICD-10-CM | POA: Diagnosis present

## 2021-04-22 DIAGNOSIS — Z6841 Body Mass Index (BMI) 40.0 and over, adult: Secondary | ICD-10-CM | POA: Diagnosis not present

## 2021-04-22 DIAGNOSIS — K635 Polyp of colon: Secondary | ICD-10-CM | POA: Diagnosis not present

## 2021-04-22 DIAGNOSIS — Z85038 Personal history of other malignant neoplasm of large intestine: Secondary | ICD-10-CM

## 2021-04-22 DIAGNOSIS — K7469 Other cirrhosis of liver: Secondary | ICD-10-CM | POA: Diagnosis present

## 2021-04-22 DIAGNOSIS — I120 Hypertensive chronic kidney disease with stage 5 chronic kidney disease or end stage renal disease: Secondary | ICD-10-CM | POA: Diagnosis not present

## 2021-04-22 DIAGNOSIS — K6381 Dieulafoy lesion of intestine: Secondary | ICD-10-CM | POA: Diagnosis not present

## 2021-04-22 DIAGNOSIS — Z8249 Family history of ischemic heart disease and other diseases of the circulatory system: Secondary | ICD-10-CM

## 2021-04-22 DIAGNOSIS — E1122 Type 2 diabetes mellitus with diabetic chronic kidney disease: Secondary | ICD-10-CM | POA: Diagnosis present

## 2021-04-22 DIAGNOSIS — I953 Hypotension of hemodialysis: Secondary | ICD-10-CM | POA: Diagnosis not present

## 2021-04-22 DIAGNOSIS — Z87891 Personal history of nicotine dependence: Secondary | ICD-10-CM

## 2021-04-22 DIAGNOSIS — R531 Weakness: Secondary | ICD-10-CM | POA: Diagnosis not present

## 2021-04-22 DIAGNOSIS — E1129 Type 2 diabetes mellitus with other diabetic kidney complication: Secondary | ICD-10-CM | POA: Diagnosis not present

## 2021-04-22 DIAGNOSIS — D696 Thrombocytopenia, unspecified: Secondary | ICD-10-CM | POA: Diagnosis not present

## 2021-04-22 DIAGNOSIS — I4891 Unspecified atrial fibrillation: Secondary | ICD-10-CM | POA: Diagnosis not present

## 2021-04-22 DIAGNOSIS — K3182 Dieulafoy lesion (hemorrhagic) of stomach and duodenum: Secondary | ICD-10-CM | POA: Diagnosis not present

## 2021-04-22 DIAGNOSIS — Z9981 Dependence on supplemental oxygen: Secondary | ICD-10-CM

## 2021-04-22 DIAGNOSIS — Z885 Allergy status to narcotic agent status: Secondary | ICD-10-CM

## 2021-04-22 DIAGNOSIS — E782 Mixed hyperlipidemia: Secondary | ICD-10-CM | POA: Diagnosis not present

## 2021-04-22 DIAGNOSIS — K429 Umbilical hernia without obstruction or gangrene: Secondary | ICD-10-CM | POA: Diagnosis present

## 2021-04-22 DIAGNOSIS — E785 Hyperlipidemia, unspecified: Secondary | ICD-10-CM | POA: Diagnosis present

## 2021-04-22 DIAGNOSIS — D61818 Other pancytopenia: Secondary | ICD-10-CM | POA: Diagnosis present

## 2021-04-22 DIAGNOSIS — I5033 Acute on chronic diastolic (congestive) heart failure: Secondary | ICD-10-CM | POA: Diagnosis not present

## 2021-04-22 DIAGNOSIS — I482 Chronic atrial fibrillation, unspecified: Secondary | ICD-10-CM | POA: Diagnosis not present

## 2021-04-22 DIAGNOSIS — G4733 Obstructive sleep apnea (adult) (pediatric): Secondary | ICD-10-CM | POA: Diagnosis present

## 2021-04-22 DIAGNOSIS — I132 Hypertensive heart and chronic kidney disease with heart failure and with stage 5 chronic kidney disease, or end stage renal disease: Secondary | ICD-10-CM | POA: Diagnosis not present

## 2021-04-22 DIAGNOSIS — N250 Renal osteodystrophy: Secondary | ICD-10-CM | POA: Diagnosis not present

## 2021-04-22 DIAGNOSIS — Z833 Family history of diabetes mellitus: Secondary | ICD-10-CM

## 2021-04-22 DIAGNOSIS — I5032 Chronic diastolic (congestive) heart failure: Secondary | ICD-10-CM | POA: Diagnosis present

## 2021-04-22 DIAGNOSIS — Z79899 Other long term (current) drug therapy: Secondary | ICD-10-CM

## 2021-04-22 DIAGNOSIS — N186 End stage renal disease: Secondary | ICD-10-CM | POA: Diagnosis not present

## 2021-04-22 DIAGNOSIS — E1121 Type 2 diabetes mellitus with diabetic nephropathy: Secondary | ICD-10-CM | POA: Diagnosis not present

## 2021-04-22 DIAGNOSIS — E039 Hypothyroidism, unspecified: Secondary | ICD-10-CM | POA: Diagnosis present

## 2021-04-22 DIAGNOSIS — Z9049 Acquired absence of other specified parts of digestive tract: Secondary | ICD-10-CM

## 2021-04-22 DIAGNOSIS — D649 Anemia, unspecified: Secondary | ICD-10-CM | POA: Diagnosis present

## 2021-04-22 DIAGNOSIS — I517 Cardiomegaly: Secondary | ICD-10-CM | POA: Diagnosis not present

## 2021-04-22 DIAGNOSIS — J9611 Chronic respiratory failure with hypoxia: Secondary | ICD-10-CM | POA: Diagnosis present

## 2021-04-22 DIAGNOSIS — Z79890 Hormone replacement therapy (postmenopausal): Secondary | ICD-10-CM

## 2021-04-22 DIAGNOSIS — I509 Heart failure, unspecified: Secondary | ICD-10-CM | POA: Diagnosis not present

## 2021-04-22 DIAGNOSIS — K921 Melena: Principal | ICD-10-CM

## 2021-04-22 DIAGNOSIS — D62 Acute posthemorrhagic anemia: Secondary | ICD-10-CM | POA: Diagnosis present

## 2021-04-22 DIAGNOSIS — Z7401 Bed confinement status: Secondary | ICD-10-CM | POA: Diagnosis not present

## 2021-04-22 DIAGNOSIS — Z7901 Long term (current) use of anticoagulants: Secondary | ICD-10-CM

## 2021-04-22 HISTORY — DX: Nonalcoholic steatohepatitis (NASH): K75.81

## 2021-04-22 HISTORY — DX: Anemia, unspecified: D64.9

## 2021-04-22 HISTORY — DX: Pulmonary hypertension, unspecified: I27.20

## 2021-04-22 HISTORY — DX: Gastro-esophageal reflux disease without esophagitis: K21.9

## 2021-04-22 HISTORY — DX: Unspecified cirrhosis of liver: K74.60

## 2021-04-22 HISTORY — DX: Insomnia, unspecified: G47.00

## 2021-04-22 HISTORY — DX: Thrombocytopenia, unspecified: D69.6

## 2021-04-22 HISTORY — DX: Hypothyroidism, unspecified: E03.9

## 2021-04-22 LAB — CBC
HCT: 25 % — ABNORMAL LOW (ref 39.0–52.0)
Hemoglobin: 7 g/dL — ABNORMAL LOW (ref 13.0–17.0)
MCH: 30.3 pg (ref 26.0–34.0)
MCHC: 28 g/dL — ABNORMAL LOW (ref 30.0–36.0)
MCV: 108.2 fL — ABNORMAL HIGH (ref 80.0–100.0)
Platelets: 101 10*3/uL — ABNORMAL LOW (ref 150–400)
RBC: 2.31 MIL/uL — ABNORMAL LOW (ref 4.22–5.81)
RDW: 26.7 % — ABNORMAL HIGH (ref 11.5–15.5)
WBC: 2.9 10*3/uL — ABNORMAL LOW (ref 4.0–10.5)
nRBC: 1.7 % — ABNORMAL HIGH (ref 0.0–0.2)

## 2021-04-22 LAB — RETICULOCYTES
Immature Retic Fract: 40 % — ABNORMAL HIGH (ref 2.3–15.9)
RBC.: 1.89 MIL/uL — ABNORMAL LOW (ref 4.22–5.81)
Retic Count, Absolute: 95.4 10*3/uL (ref 19.0–186.0)
Retic Ct Pct: 5.1 % — ABNORMAL HIGH (ref 0.4–3.1)

## 2021-04-22 LAB — CBC WITH DIFFERENTIAL/PLATELET
Abs Immature Granulocytes: 0.05 10*3/uL (ref 0.00–0.07)
Basophils Absolute: 0 10*3/uL (ref 0.0–0.1)
Basophils Relative: 1 %
Eosinophils Absolute: 0 10*3/uL (ref 0.0–0.5)
Eosinophils Relative: 2 %
HCT: 20.7 % — ABNORMAL LOW (ref 39.0–52.0)
Hemoglobin: 5.8 g/dL — CL (ref 13.0–17.0)
Immature Granulocytes: 2 %
Lymphocytes Relative: 13 %
Lymphs Abs: 0.3 10*3/uL — ABNORMAL LOW (ref 0.7–4.0)
MCH: 31.4 pg (ref 26.0–34.0)
MCHC: 28 g/dL — ABNORMAL LOW (ref 30.0–36.0)
MCV: 111.9 fL — ABNORMAL HIGH (ref 80.0–100.0)
Monocytes Absolute: 0.3 10*3/uL (ref 0.1–1.0)
Monocytes Relative: 13 %
Neutro Abs: 1.8 10*3/uL (ref 1.7–7.7)
Neutrophils Relative %: 69 %
Platelets: 93 10*3/uL — ABNORMAL LOW (ref 150–400)
RBC: 1.85 MIL/uL — ABNORMAL LOW (ref 4.22–5.81)
RDW: 26.1 % — ABNORMAL HIGH (ref 11.5–15.5)
WBC: 2.5 10*3/uL — ABNORMAL LOW (ref 4.0–10.5)
nRBC: 1.2 % — ABNORMAL HIGH (ref 0.0–0.2)

## 2021-04-22 LAB — PROTIME-INR
INR: 1.9 — ABNORMAL HIGH (ref 0.8–1.2)
Prothrombin Time: 21.8 seconds — ABNORMAL HIGH (ref 11.4–15.2)

## 2021-04-22 LAB — COMPREHENSIVE METABOLIC PANEL
ALT: 28 U/L (ref 0–44)
AST: 35 U/L (ref 15–41)
Albumin: 2.5 g/dL — ABNORMAL LOW (ref 3.5–5.0)
Alkaline Phosphatase: 68 U/L (ref 38–126)
Anion gap: 6 (ref 5–15)
BUN: 24 mg/dL — ABNORMAL HIGH (ref 8–23)
CO2: 27 mmol/L (ref 22–32)
Calcium: 7.9 mg/dL — ABNORMAL LOW (ref 8.9–10.3)
Chloride: 104 mmol/L (ref 98–111)
Creatinine, Ser: 3.49 mg/dL — ABNORMAL HIGH (ref 0.61–1.24)
GFR, Estimated: 18 mL/min — ABNORMAL LOW (ref >60)
Glucose, Bld: 114 mg/dL — ABNORMAL HIGH (ref 70–99)
Potassium: 3.3 mmol/L — ABNORMAL LOW (ref 3.5–5.1)
Sodium: 137 mmol/L (ref 135–145)
Total Bilirubin: 0.7 mg/dL (ref 0.3–1.2)
Total Protein: 6 g/dL — ABNORMAL LOW (ref 6.5–8.1)

## 2021-04-22 LAB — FERRITIN: Ferritin: 135 ng/mL (ref 24–336)

## 2021-04-22 LAB — GLUCOSE, CAPILLARY: Glucose-Capillary: 106 mg/dL — ABNORMAL HIGH (ref 70–99)

## 2021-04-22 LAB — IRON AND TIBC
Iron: 79 ug/dL (ref 45–182)
Saturation Ratios: 25 % (ref 17.9–39.5)
TIBC: 314 ug/dL (ref 250–450)
UIBC: 235 ug/dL

## 2021-04-22 LAB — FOLATE: Folate: 11.1 ng/mL (ref >5.9)

## 2021-04-22 LAB — POC OCCULT BLOOD, ED: Fecal Occult Bld: POSITIVE — AB

## 2021-04-22 LAB — VITAMIN B12: Vitamin B-12: 852 pg/mL (ref 180–914)

## 2021-04-22 LAB — PREPARE RBC (CROSSMATCH)

## 2021-04-22 LAB — APTT: aPTT: 54 seconds — ABNORMAL HIGH (ref 24–36)

## 2021-04-22 MED ORDER — SODIUM CHLORIDE 0.9 % IV SOLN
10.0000 mL/h | Freq: Once | INTRAVENOUS | Status: AC
Start: 2021-04-22 — End: 2021-04-22
  Administered 2021-04-22: 10 mL/h via INTRAVENOUS

## 2021-04-22 MED ORDER — POLYETHYLENE GLYCOL 3350 17 G PO PACK: 17.0000 g | PACK | Freq: Every day | ORAL | Status: DC | PRN

## 2021-04-22 MED ORDER — ONDANSETRON HCL 4 MG/2ML IJ SOLN: 4.0000 mg | Freq: Four times a day (QID) | INTRAMUSCULAR | Status: DC | PRN

## 2021-04-22 MED ORDER — ACETAMINOPHEN 325 MG PO TABS
650.0000 mg | ORAL_TABLET | Freq: Four times a day (QID) | ORAL | Status: DC | PRN
  Administered 2021-04-25 – 2021-04-26 (×2): 650 mg via ORAL
  Filled 2021-04-22 (×3): qty 2

## 2021-04-22 MED ORDER — SILDENAFIL CITRATE 20 MG PO TABS
20.0000 mg | ORAL_TABLET | Freq: Three times a day (TID) | ORAL | Status: DC
  Administered 2021-04-22 – 2021-04-26 (×4): 20 mg via ORAL
  Filled 2021-04-22 (×6): qty 1

## 2021-04-22 MED ORDER — PANTOPRAZOLE SODIUM 40 MG IV SOLR
40.0000 mg | INTRAVENOUS | Status: DC
  Administered 2021-04-23: 40 mg via INTRAVENOUS
  Filled 2021-04-22: qty 10

## 2021-04-22 MED ORDER — PROTHROMBIN COMPLEX CONC HUMAN 500 UNITS IV KIT
2763.0000 [IU] | PACK | Status: AC
  Administered 2021-04-22: 2763 [IU] via INTRAVENOUS
  Filled 2021-04-22: qty 2763

## 2021-04-22 MED ORDER — INSULIN ASPART 100 UNIT/ML IJ SOLN
0.0000 [IU] | INTRAMUSCULAR | Status: DC
  Administered 2021-04-23: 1 [IU] via SUBCUTANEOUS

## 2021-04-22 MED ORDER — ALLOPURINOL 100 MG PO TABS
200.0000 mg | ORAL_TABLET | Freq: Every day | ORAL | Status: DC
Start: 2021-04-23 — End: 2021-04-26
  Administered 2021-04-23 – 2021-04-26 (×3): 200 mg via ORAL
  Filled 2021-04-22 (×3): qty 2

## 2021-04-22 MED ORDER — SODIUM CHLORIDE 0.9 % IV SOLN: 10.0000 mL/h | Freq: Once | INTRAVENOUS | Status: DC

## 2021-04-22 MED ORDER — PANTOPRAZOLE SODIUM 40 MG IV SOLR
40.0000 mg | Freq: Once | INTRAVENOUS | Status: AC
Start: 2021-04-22 — End: 2021-04-22
  Administered 2021-04-22: 40 mg via INTRAVENOUS
  Filled 2021-04-22: qty 10

## 2021-04-22 MED ORDER — LEVOTHYROXINE SODIUM 100 MCG PO TABS
100.0000 ug | ORAL_TABLET | Freq: Every morning | ORAL | Status: DC
  Administered 2021-04-23 – 2021-04-26 (×4): 100 ug via ORAL
  Filled 2021-04-22 (×4): qty 1

## 2021-04-22 MED ORDER — VITAMIN K1 10 MG/ML IJ SOLN
10.0000 mg | INTRAVENOUS | Status: AC
  Administered 2021-04-22: 10 mg via INTRAVENOUS
  Filled 2021-04-22: qty 1

## 2021-04-22 MED ORDER — ACETAMINOPHEN 650 MG RE SUPP: 650.0000 mg | Freq: Four times a day (QID) | RECTAL | Status: DC | PRN

## 2021-04-22 MED ORDER — PROTHROMBIN COMPLEX CONC HUMAN 500 UNITS IV KIT
2500.0000 [IU] | PACK | Status: DC
  Filled 2021-04-22: qty 2500

## 2021-04-22 MED ORDER — ONDANSETRON HCL 4 MG PO TABS: 4.0000 mg | ORAL_TABLET | Freq: Four times a day (QID) | ORAL | Status: DC | PRN

## 2021-04-22 MED ORDER — LACTULOSE ENCEPHALOPATHY 10 GM/15ML PO SOLN
20.0000 g | Freq: Every day | ORAL | Status: DC
  Filled 2021-04-22 (×4): qty 30

## 2021-04-22 NOTE — Assessment & Plan Note (Signed)
Resume sildenafil ?

## 2021-04-22 NOTE — Assessment & Plan Note (Addendum)
Rate controlled rate mostly 90s.  Patient states he is on warfarin, but he is actually on Eliquis- per medication list from nursing home.  INR 1.9.  Currently in atrial fibrillation.  ?-Hold anticoagulation ?

## 2021-04-22 NOTE — Assessment & Plan Note (Signed)
-   SSI- S ?-Hold Levemir 16 units nightly for now  ?

## 2021-04-22 NOTE — ED Notes (Signed)
Hospitalists at bedside.  

## 2021-04-22 NOTE — ED Triage Notes (Signed)
Pt brought in by [facility] from [facility] with c/o hemoglobin level low - 6.5. Pt went to dialysis this morning and they found his hemoglobin to be low and sent him back to the nursing home without dialysis. Pt reports rectal bleeding x 4-5 days. Denies pain. BP 93/55 for EMS.  ?

## 2021-04-22 NOTE — Assessment & Plan Note (Signed)
OSA, OHS, morbid obesity, with chronic respiratory failure on 2 L. ?-CPAP nightly ?-Supplemental O2 ?

## 2021-04-22 NOTE — Assessment & Plan Note (Addendum)
Hemoglobin 5.8, recent baseline 10-11.  History of lower GI blood loss- 10/2020, patient had large rectal polyps that were intermittently oozing. He reports rectal bleeding- ? severity.  History of [place] liver cirrhosis.  And on anticoagulation with eliquis-last dose this morning. INR 1.9.  Systolic 15T to low 301S.  Anemia panel drawn before transfusion, iron panel and ferritin within acceptable limits, normal B12 and folate. ?-1 unit PRBC transfused ?-Will need a second unit, but held off for now with ESRD status ?-N.p.o. midnight ?- GI to see in tomorrow. Dr. Rourk-recommended clear liquid diet, n.p.o. midnight, IV Protonix ?- IV Protonix 40 daily ?-At this time, I do not think he needs platelet transfusion. ?

## 2021-04-22 NOTE — ED Provider Notes (Signed)
?[facility] ?Provider Note ? ? ?CSN: [id number] ?Arrival date & time: 04/22/21  1338 ? ?  ? ?History ? ?Chief Complaint  ?Patient presents with  ? Abnormal Labs  ? ? ?William Flores is a 71 y.o. male with a past medical history of ESRD on HD Tuesday Thursday Saturday, CAD, CHF, A-fib anticoagulated with warfarin, pulmonary hypertension, liver cirrhosis secondary to [place], hypertension, who presents today for concern of GI bleeding and anemia. ?He reports that for the past week he has been having dark red blood in his bowel movements.  His hemoglobin was checked and was reportedly 6.5.  He went to dialysis today.  He reports he had a full dialysis session today.  He denies any abdominal pain, increased fatigue or shortness of breath. ?He is at 2 L of oxygen at baseline.   ? ?Patient reports he is actually on warfarin, despite medical record saying he is on eliquis.  He tells me this was changed.  ? ? ?He is willing to accept a blood transfusion if needed today. ? ?Chart review shows that on 10/17/2020 he had a colonoscopy with Dr. Jenetta Downer, after this he had some bleeding which resolved.   ? ?HPI ? ?  ? ?Home Medications ?Prior to Admission medications   ?Medication Sig Start Date End Date Taking? Authorizing Provider  ?acetaminophen (TYLENOL) 160 MG/5ML liquid Take 650 mg by mouth every 4 (four) hours as needed for fever.   Yes [provider]  ?allopurinol (ZYLOPRIM) 100 MG tablet Take 200 mg by mouth daily.   Yes [provider]  ?Amino Acids-Protein Hydrolys (PRO-STAT) LIQD Take 30 mLs by mouth daily.   Yes [provider]  ?apixaban (ELIQUIS) 5 MG TABS tablet Take 1 tablet (5 mg total) by mouth 2 (two) times daily. 12/11/20  Yes Florencia Reasons, MD  ?atorvastatin (LIPITOR) 20 MG tablet TAKE 1 TABLET BY MOUTH EVERY DAY ?Patient taking differently: Take 20 mg by mouth at bedtime. 03/19/20  Yes [name], [name], MD  ?B Complex-C (B-COMPLEX WITH VITAMIN C) tablet Take 1  tablet by mouth daily. 12/12/20  Yes Florencia Reasons, MD  ?Cholecalciferol (VITAMIN D) 125 MCG (5000 UT) CAPS Take 5,000 Units by mouth daily.   Yes [provider]  ?ENULOSE 10 GM/15ML SOLN Take 20 g by mouth daily. 04/13/21  Yes [provider]  ?ferrous sulfate 325 (65 FE) MG tablet Take 325 mg by mouth daily with breakfast.   Yes [provider]  ?guaiFENesin (ROBITUSSIN) 100 MG/5ML liquid Take 5 mLs by mouth every 4 (four) hours as needed for cough or to loosen phlegm.   Yes [provider]  ?insulin aspart (NOVOLOG) 100 UNIT/ML injection Before each meal 3 times a day, 140-199 - 2 units, 200-250 - 4 units, 251-299 - 6 units,  300-349 - 8 units,  350 or above 10 units. ?Insulin PEN if approved, provide syringes and needles if needed. 12/11/20  Yes Florencia Reasons, MD  ?insulin detemir (LEVEMIR) 100 UNIT/ML injection Inject 16 Units into the skin at bedtime.   Yes [provider]  ?levothyroxine (SYNTHROID) 100 MCG tablet Take 100 mcg by mouth every morning.   Yes [provider]  ?melatonin 5 MG TABS Take 5 mg by mouth at bedtime as needed (insomnia).   Yes [provider]  ?metoprolol succinate (TOPROL-XL) 25 MG 24 hr tablet Take 25 mg by mouth daily. 12/25/20  Yes [provider]  ?Nutritional Supplements (NOVASOURCE RENAL) LIQD Take 240 mLs  by mouth at bedtime.   Yes [provider]  ?nystatin cream (MYCOSTATIN) Apply topically. 03/05/21  Yes [provider]  ?OXYGEN Inhale 2 L into the lungs continuous.   Yes [provider]  ?pantoprazole (PROTONIX) 40 MG tablet TAKE 1 TABLET BY MOUTH EVERY DAY 06/02/20  Yes [name], [name], MD  ?polyethylene glycol (MIRALAX / GLYCOLAX) 17 g packet Take 17 g by mouth daily. 01/01/19  Yes Emokpae, Courage, MD  ?potassium chloride SA (KLOR-CON M) 20 MEQ tablet Take 20 mEq by mouth daily.   Yes [provider]  ?sildenafil (REVATIO) 20 MG tablet Take 1 tablet (20 mg total) by mouth 3  (three) times daily. 08/24/20  Yes Larey Dresser, MD  ?traMADol (ULTRAM) 50 MG tablet Take 1 tablet (50 mg total) by mouth every 6 (six) hours as needed. 03/02/21  Yes Early, Arvilla Meres, MD  ?glucose blood (CONTOUR NEXT TEST) test strip Test BS TID Dx E11.29 05/22/20   [name], [name], MD  ?Insulin Pen Needle 32G X 4 MM MISC Use to give insulin daily Dx E11.29 04/15/19   Charlynne Cousins, MD  ?oxyCODONE (OXY IR/ROXICODONE) 5 MG immediate release tablet Take 1 tablet (5 mg total) by mouth every 6 (six) hours as needed for moderate pain or severe pain. ?Patient not taking: Reported on 04/22/2021 12/11/20   Florencia Reasons, MD  ?Zinc Oxide (TRIPLE PASTE) 12.8 % ointment Apply topically 2 (two) times daily. ?Patient not taking: Reported on 04/22/2021 12/11/20   Florencia Reasons, MD  ?diltiazem [facility]) 240 MG 24 hr capsule TAKE 1 CAPSULE DAILY 10/19/12 02/17/13  Vernie Shanks, MD  ?   ? ?Allergies    ?Codeine   ? ?Review of Systems   ?Review of Systems ? ?Physical Exam ?Updated Vital Signs ?BP (!) 120/57   Pulse (!) 101   Temp 98.3 ?F (36.8 ?C) (Oral)   Resp (!) 22   Ht 5' 4"  (1.626 m)   Wt 112.3 kg   SpO2 98%   BMI 42.50 kg/m?  ?Physical Exam ?Vitals and nursing note reviewed. Exam conducted with a chaperone present (ED RN).  ?Constitutional:   ?   Comments: Chronically ill-appearing  ?HENT:  ?   Head: Atraumatic.  ?Eyes:  ?   Conjunctiva/sclera: Conjunctivae normal.  ?   Comments: Significant bilateral conjunctival pallor.  ?Cardiovascular:  ?   Rate and Rhythm: Normal rate. Rhythm irregular.  ?   Comments: Dialysis access right anterior chest.  Palpable thrill in fistula in right arm. ?Pulmonary:  ?   Effort: Pulmonary effort is normal. No respiratory distress.  ?Abdominal:  ?   General: There is no distension.  ?   Tenderness: There is no abdominal tenderness. There is no guarding.  ?Genitourinary: ?   Rectum: Guaiac result positive.  ?   Comments: There is frank red blood in the rectal vault without palpable  stool. ?Musculoskeletal:  ?   Cervical back: Normal range of motion and neck supple.  ?   Right lower leg: No edema.  ?   Left lower leg: No edema.  ?   Comments: No obvious acute injury  ?Skin: ?   General: Skin is warm.  ?   Coloration: Skin is pale.  ?Neurological:  ?   Mental Status: He is alert.  ?   Comments: Awake and alert, answers all questions appropriately.  Speech is not slurred.  ?Psychiatric:     ?   Mood and Affect: Mood normal.     ?  Behavior: Behavior normal.  ? ? ?ED Results / Procedures / Treatments   ?Labs ?(all labs ordered are listed, but only abnormal results are displayed) ?Labs Reviewed  ?COMPREHENSIVE METABOLIC PANEL - Abnormal; Notable for the following components:  ?    Result Value  ? Potassium 3.3 (*)   ? Glucose, Bld 114 (*)   ? BUN 24 (*)   ? Creatinine, Ser 3.49 (*)   ? Calcium 7.9 (*)   ? Total Protein 6.0 (*)   ? Albumin 2.5 (*)   ? GFR, Estimated 18 (*)   ? All other components within normal limits  ?CBC WITH DIFFERENTIAL/PLATELET - Abnormal; Notable for the following components:  ? WBC 2.5 (*)   ? RBC 1.85 (*)   ? Hemoglobin 5.8 (*)   ? HCT 20.7 (*)   ? MCV 111.9 (*)   ? MCHC 28.0 (*)   ? RDW 26.1 (*)   ? Platelets 93 (*)   ? nRBC 1.2 (*)   ? Lymphs Abs 0.3 (*)   ? All other components within normal limits  ?PROTIME-INR - Abnormal; Notable for the following components:  ? Prothrombin Time 21.8 (*)   ? INR 1.9 (*)   ? All other components within normal limits  ?APTT - Abnormal; Notable for the following components:  ? aPTT 54 (*)   ? All other components within normal limits  ?RETICULOCYTES - Abnormal; Notable for the following components:  ? Retic Ct Pct 5.1 (*)   ? RBC. 1.89 (*)   ? Immature Retic Fract 40.0 (*)   ? All other components within normal limits  ?CBC - Abnormal; Notable for the following components:  ? WBC 2.9 (*)   ? RBC 2.31 (*)   ? Hemoglobin 7.0 (*)   ? HCT 25.0 (*)   ? MCV 108.2 (*)   ? MCHC 28.0 (*)   ? RDW 26.7 (*)   ? Platelets 101 (*)   ? nRBC 1.7 (*)   ?  All other components within normal limits  ?POC OCCULT BLOOD, ED - Abnormal; Notable for the following components:  ? Fecal Occult Bld POSITIVE (*)   ? All other components within normal limits  ?MRSA NEXT GEN BY PCR, NASAL

## 2021-04-22 NOTE — Assessment & Plan Note (Addendum)
Schedule Tuesday Thursday Saturday.  Reports to me that he completed HD today, told charge triage nurse earlier he did not. Makes very little urine. ?-Please consult nephrology in a.m. for HD, he will need a second unit of blood ?

## 2021-04-22 NOTE — Chronic Care Management (AMB) (Signed)
?  Care Management  ? ?Outreach Note ? ?04/22/2021 ?Name: William Flores MRN: [id number] DOB: 08/23/1950 ? ?Referred by: [name], [name], MD ?Reason for referral : Care Coordination (Outreach to schedule initial with RN CM from HR list ) ? ? ?A second unsuccessful telephone outreach was attempted today. The patient was referred to the case management team for assistance with care management and care coordination.  ? ?Follow Up Plan:  ?A HIPAA compliant phone message was left for the patient providing contact information and requesting a return call.  ?The care management team will reach out to the patient again over the next 7 days.  ?If patient returns call to provider office, please advise to call [facility] * at [phone]* ? ?Noreene Larsson, RMA ?Care Guide, Embedded Care Coordination ?[facility]  ?[address] [place], [place] [address] ?Direct Dial: [phone] ?[email or web address] ?Website: [email or web address]  ? ?

## 2021-04-22 NOTE — Assessment & Plan Note (Addendum)
Pancytopenic, with platelets of 93, recent baseline mid to low 100s.  Leukopenia WBC 2.5.  And anemia. ON once daily lactulose. ? ?

## 2021-04-22 NOTE — H&P (Addendum)
History and Physical    COSTANTINO ROSSBERG Flores:[id number] DOB: [birth year] DOA: 04/22/2021  PCP: [name], Elige Radon, MD   Patient coming from: William Flores  I have personally briefly reviewed patient's old medical records in [facility]  Chief Complaint: Low hemoglobin   HPI: William Flores is a 71 y.o. male with medical history significant for multiple significant medical problems including ESRD, chronic respiratory failure on 2 L, [place] liver cirrhosis, obesity with obesity hypoventilation syndrome and OSA, diabetes mellitus, atrial fibrillation, congestive heart failure. Patient was sent to the ED from hemodialysis center with reports of low hemoglobin of 6.5..  Patient completed his session of dialysis today.  Reports over the past 4 to 5 days he has had bleeding from his rectum.  Patient initially tells me the blood from his rectum was only seen when he wiped, later reports there was blood in the toilet.  He denies vomiting.  He is unaware of having any black stools.  Reports fatigue.  He has chronic unchanged difficulty breathing, no chest pain. Reports an episode of blood in his urine which resolved, started a few weeks after he was started on dialysis. Patient history not quite reliable, he gives conflicting history.  He states he is on warfarin, medication list has Eliquis.  He reported to triage nurse that he did not have dialysis today but he tells me he completed HD today.  Unable to quantify how much blood he is having with bowel movements.  ED Course: Temperature 98.1.  Heart rate 90s to 100.  Respiratory rate 15-24.  Blood pressure systolic ranging from 92-115.  93.  WBC 2.5.  O2 sats 99 on 2 L. WBC 2.5.  Hemoglobin 5.8.  INR 1.9.  Platelets 93.  WBC 2.5. Patient reported he is also on warfarin to EDP, so he was given vitamin K 10 mg, Kcentra. EDP talked with Dr. Jena Gauss, patient to be seen in the morning, recommended Protonix.  Review of Systems: As per HPI all other systems  reviewed and negative.  Past Medical History:  Diagnosis Date   Anemia    CAD (coronary artery disease)    LHC 2/08:  mRCA 50%, o/w no sig CAD, EF 25%   Cardiomyopathy (HCC)    EF 35-40% in 2/16 in setting of AF with RVR >> echo 5/16 with improved LVF with EF 65-70%   CHF (congestive heart failure) (HCC)    Chronic atrial fibrillation (HCC)    Chronic diastolic heart failure (HCC)    HFPF - EF ~65-70% (OFTEN EXACERBATED BY AFIB)   Cirrhosis of liver (HCC)    CKD (chronic kidney disease)    hx of worsening renal failure and hyperK+ in setting of acute diast CHF >> required CVVHD in 4/16 and dialysis x 1 in 5/16   CKD stage 3 due to type 2 diabetes mellitus (HCC) 09/17/2015   Colon cancer (HCC)    Colon polyps 09/21/2012   Tubular adenoma   Diabetes (HCC)    GERD (gastroesophageal reflux disease)    Hyperlipidemia    Hypertension    Hypothyroidism    Insomnia    Nonalcoholic steatohepatitis    Obesity hypoventilation syndrome (HCC)    Pulmonary hypertension (HCC)    Renal insufficiency    Sleep apnea    Thrombocytopenia (HCC)     Past Surgical History:  Procedure Laterality Date   AV FISTULA PLACEMENT Right 03/02/2021   Procedure: RIGHT ARM ARTERIOVENOUS (AV) FISTULA CREATION;  Surgeon: Larina Earthly, MD;  Location: [facility];  Service: Vascular;  Laterality: Right;   CIRCUMCISION     COLON RESECTION     COLONOSCOPY N/A 09/21/2012   Procedure: COLONOSCOPY;  Surgeon: Louis Meckel, MD;  Location: [facility] END[facility];  Service: Endoscopy;  Laterality: N/A;   COLONOSCOPY N/A 12/28/2018   Procedure: COLONOSCOPY;  Surgeon: Malissa Hippo, MD;  Location: [facility];  Service: Endoscopy;  Laterality: N/A;   COLONOSCOPY WITH PROPOFOL N/A 10/17/2020   Procedure: COLONOSCOPY WITH PROPOFOL;  Surgeon: Dolores Frame, MD;  Location: [facility];  Service: Gastroenterology;  Laterality: N/A;   ENTEROSCOPY N/A 06/24/2020   Procedure: ENTEROSCOPY;  Surgeon: Iva Boop, MD;   Location: [facility] ENDOSCOPY;  Service: Endoscopy;  Laterality: N/A;   ESOPHAGOGASTRODUODENOSCOPY N/A 12/28/2018   Procedure: ESOPHAGOGASTRODUODENOSCOPY (EGD);  Surgeon: Malissa Hippo, MD;  Location: [facility];  Service: Endoscopy;  Laterality: N/A;   IR FLUORO GUIDE CV LINE RIGHT  12/10/2020   IR US GUIDE VASC ACCESS RIGHT  12/11/2020   POLYPECTOMY  10/17/2020   Procedure: POLYPECTOMY;  Surgeon: Dolores Frame, MD;  Location: [facility];  Service: Gastroenterology;;  rectal, transverse, descending, ascending, sigmoid   RIGHT HEART CATH N/A 04/11/2019   Procedure: RIGHT HEART CATH;  Surgeon: Laurey Morale, MD;  [facility] INVASIVE CV LAB;  Service: Cardiovascular;  Laterality: N/A;   US ECHOCARDIOGRAPHY  03/04/2010   abnormal study     reports that he quit smoking about 37 years ago. His smoking use included cigarettes. He has never used smokeless tobacco. He reports that he does not drink alcohol and does not use drugs.  Allergies  Allergen Reactions   Codeine     Headache     Family History  Problem Relation Age of Onset   Breast cancer Mother    Diabetes Mother    Heart attack Father     Prior to Admission medications   Medication Sig Start Date End Date Taking? Authorizing Provider  acetaminophen (TYLENOL) 160 MG/5ML liquid Take 650 mg by mouth every 4 (four) hours as needed for fever.   Yes [provider]  allopurinol (ZYLOPRIM) 100 MG tablet Take 200 mg by mouth daily.   Yes [provider]  Amino Acids-Protein Hydrolys (PRO-STAT) LIQD Take 30 mLs by mouth daily.   Yes [provider]  apixaban (ELIQUIS) 5 MG TABS tablet Take 1 tablet (5 mg total) by mouth 2 (two) times daily. 12/11/20  Yes Albertine Grates, MD  atorvastatin (LIPITOR) 20 MG tablet TAKE 1 TABLET BY MOUTH EVERY DAY Patient taking differently: Take 20 mg by mouth at bedtime. 03/19/20  Yes [name], Elige Radon, MD  B Complex-C (B-COMPLEX WITH VITAMIN C) tablet Take 1 tablet by  mouth daily. 12/12/20  Yes Albertine Grates, MD  Cholecalciferol (VITAMIN D) 125 MCG (5000 UT) CAPS Take 5,000 Units by mouth daily.   Yes [provider]  ENULOSE 10 GM/15ML SOLN Take 20 g by mouth daily. 04/13/21  Yes [provider]  ferrous sulfate 325 (65 FE) MG tablet Take 325 mg by mouth daily with breakfast.   Yes [provider]  guaiFENesin (ROBITUSSIN) 100 MG/5ML liquid Take 5 mLs by mouth every 4 (four) hours as needed for cough or to loosen phlegm.   Yes [provider]  insulin aspart (NOVOLOG) 100 UNIT/ML injection Before each meal 3 times a day, 140-199 - 2 units, 200-250 - 4 units, 251-299 - 6 units,  300-349 - 8 units,  350  or above 10 units. Insulin PEN if approved, provide syringes and needles if needed. 12/11/20  Yes Albertine Grates, MD  insulin detemir (LEVEMIR) 100 UNIT/ML injection Inject 16 Units into the skin at bedtime.   Yes [provider]  levothyroxine (SYNTHROID) 100 MCG tablet Take 100 mcg by mouth every morning.   Yes [provider]  melatonin 5 MG TABS Take 5 mg by mouth at bedtime as needed (insomnia).   Yes [provider]  metoprolol succinate (TOPROL-XL) 25 MG 24 hr tablet Take 25 mg by mouth daily. 12/25/20  Yes [provider]  Nutritional Supplements (NOVASOURCE RENAL) LIQD Take 240 mLs by mouth at bedtime.   Yes [provider]  nystatin cream (MYCOSTATIN) Apply topically. 03/05/21  Yes [provider]  OXYGEN Inhale 2 L into the lungs continuous.   Yes [provider]  pantoprazole (PROTONIX) 40 MG tablet TAKE 1 TABLET BY MOUTH EVERY DAY 06/02/20  Yes [name], Elige Radon, MD  polyethylene glycol (MIRALAX / GLYCOLAX) 17 g packet Take 17 g by mouth daily. 01/01/19  Yes Eda Magnussen, Courage, MD  potassium chloride SA (KLOR-CON M) 20 MEQ tablet Take 20 mEq by mouth daily.   Yes [provider]  sildenafil (REVATIO) 20 MG tablet Take 1 tablet (20 mg total) by mouth 3 (three)  times daily. 08/24/20  Yes Laurey Morale, MD  traMADol (ULTRAM) 50 MG tablet Take 1 tablet (50 mg total) by mouth every 6 (six) hours as needed. 03/02/21  Yes Early, Kristen Loader, MD  glucose blood (CONTOUR NEXT TEST) test strip Test BS TID Dx E11.29 05/22/20   [name], Elige Radon, MD  Insulin Pen Needle 32G X 4 MM MISC Use to give insulin daily Dx E11.29 04/15/19   Marinda Elk, MD  oxyCODONE (OXY IR/ROXICODONE) 5 MG immediate release tablet Take 1 tablet (5 mg total) by mouth every 6 (six) hours as needed for moderate pain or severe pain. Patient not taking: Reported on 04/22/2021 12/11/20   Albertine Grates, MD  Zinc Oxide (TRIPLE PASTE) 12.8 % ointment Apply topically 2 (two) times daily. Patient not taking: Reported on 04/22/2021 12/11/20   Albertine Grates, MD  diltiazem [facility]) 240 MG 24 hr capsule TAKE 1 CAPSULE DAILY 10/19/12 02/17/13  Ileana Ladd, MD    Physical Exam: Vitals:   04/22/21 1641 04/22/21 1647 04/22/21 1652 04/22/21 1657  BP:  96/61 (!) 92/59 (!) 100/48  Pulse:  95 98 97  Resp:  15 18 20   Temp: 98.3 F (36.8 C) 98.1 F (36.7 C) 97.8 F (36.6 C) 98.3 F (36.8 C)  TempSrc: Oral Oral Oral   SpO2:  100% 99% 99%  Weight:      Height:        Constitutional: NAD, calm, comfortable Vitals:   04/22/21 1641 04/22/21 1647 04/22/21 1652 04/22/21 1657  BP:  96/61 (!) 92/59 (!) 100/48  Pulse:  95 98 97  Resp:  15 18 20   Temp: 98.3 F (36.8 C) 98.1 F (36.7 C) 97.8 F (36.6 C) 98.3 F (36.8 C)  TempSrc: Oral Oral Oral   SpO2:  100% 99% 99%  Weight:      Height:       Eyes: PERRL, lids and conjunctivae normal ENMT: Mucous membranes are moist. Neck: normal, supple, no masses, no thyromegaly Respiratory: clear to auscultation bilaterally, no wheezing, no crackles. Normal respiratory effort.  Cardiovascular: Regular rate and rhythm, no murmurs / rubs / gallops.  No lower extremity edema lower  extremities warm. Abdomen: no tenderness, no masses palpated. No hepatosplenomegaly.  Bowel sounds positive.  Musculoskeletal: no clubbing / cyanosis. No joint deformity upper and lower extremities. Skin: no rashes, lesions, ulcers. No induration Neurologic: No apparent cranial nerve abnormality, moving extremities spontaneously. Psychiatric: Normal judgment and insight. Alert and oriented x 3. Normal mood.   Labs on Admission: I have personally reviewed following labs and imaging studies  CBC: Recent Labs  Lab 04/22/21 1419  WBC 2.5*  NEUTROABS 1.8  HGB 5.8*  HCT 20.7*  MCV 111.9*  PLT 93*   Basic Metabolic Panel: Recent Labs  Lab 04/22/21 1419  NA 137  K 3.3*  CL 104  CO2 27  GLUCOSE 114*  BUN 24*  CREATININE 3.49*  CALCIUM 7.9*   GFR: Estimated Creatinine Clearance: 22.2 mL/min (A) (by C-G formula based on SCr of 3.49 mg/dL (H)). Liver Function Tests: Recent Labs  Lab 04/22/21 1419  AST 35  ALT 28  ALKPHOS 68  BILITOT 0.7  PROT 6.0*  ALBUMIN 2.5*    Coagulation Profile: Recent Labs  Lab 04/22/21 1419  INR 1.9*   Anemia Panel: Recent Labs    04/22/21 1500  VITAMINB12 852  FOLATE 11.1  FERRITIN 135  TIBC 314  IRON 79  RETICCTPCT 5.1*   Urine analysis:    Component Value Date/Time   COLORURINE YELLOW 06/21/2020 1059   APPEARANCEUR CLEAR 06/21/2020 1059   LABSPEC 1.020 06/21/2020 1059   PHURINE 5.5 06/21/2020 1059   GLUCOSEU NEGATIVE 06/21/2020 1059   HGBUR LARGE (A) 06/21/2020 1059   BILIRUBINUR NEGATIVE 06/21/2020 1059   KETONESUR NEGATIVE 06/21/2020 1059   PROTEINUR 100 (A) 06/21/2020 1059   UROBILINOGEN 0.2 05/16/2014 0822   NITRITE NEGATIVE 06/21/2020 1059   LEUKOCYTESUR NEGATIVE 06/21/2020 1059    Radiological Exams on Admission: No results found.  EKG: Independently reviewed.  Atrial fibrillation rate 97, QTc 432.  Low voltage.  No significant change from prior.  Assessment/Plan Principal Problem:   Acute GI bleeding Active Problems:   OSA (obstructive sleep apnea)   Anticoagulated on Coumadin   Atrial  fibrillation (HCC)   DM (diabetes mellitus), type 2 with renal complications (HCC)   Chronic respiratory failure with hypoxia (HCC)   Essential hypertension   Acute on chronic anemia   Liver cirrhosis secondary to NASH (HCC)   ESRD (end stage renal disease) on dialysis [facility])   Assessment and Plan: ESRD (end stage renal disease) on dialysis [facility]) Schedule Tuesday Thursday Saturday.  Reports to me that he completed HD today, told charge triage nurse earlier he did not. Makes very little urine. -Please consult nephrology in a.m. for HD, he will need a second unit of blood  Liver cirrhosis secondary to NASH (HCC) Pancytopenic, with platelets of 93, recent baseline mid to low 100s.  Leukopenia WBC 2.5.  And anemia. ON once daily lactulose.   Pulmonary hypertension (HCC) Resume sildenafil  Acute on chronic anemia Hemoglobin 5.8, recent baseline 10-11.  History of lower GI blood loss- 10/2020, patient had large rectal polyps that were intermittently oozing. He reports rectal bleeding- ? severity.  History of [place] liver cirrhosis.  And on anticoagulation with eliquis-last dose this morning. INR 1.9.  Systolic 90s to low 100s.  Anemia panel drawn before transfusion, iron panel and ferritin within acceptable limits, normal B12 and folate. -1 unit PRBC transfused -Will need a second unit, but held off for now with ESRD status -N.p.o. midnight - GI to see in tomorrow. Dr. Rourk-recommended clear liquid diet,  n.p.o. midnight, IV Protonix - IV Protonix 40 daily -At this time, I do not think he needs platelet transfusion.  DM (diabetes mellitus), type 2 with renal complications (HCC) - SSI- S -Hold Levemir 16 units nightly for now   Atrial fibrillation (HCC) Rate controlled rate mostly 90s.  Patient states he is on warfarin, but he is actually on Eliquis- per medication list from nursing home.  INR 1.9.  Currently in atrial fibrillation.  -Hold anticoagulation  OSA (obstructive sleep  apnea) OSA, OHS, morbid obesity, with chronic respiratory failure on 2 L. -CPAP nightly -Supplemental O2    DVT prophylaxis: SCDs Code Status: Full Family Communication: None at bedside Disposition Plan: ~ 2 days Consults called: GI Admission status: Inpt stepdown I certify that at the point of admission it is my clinical judgment that the patient will require inpatient hospital care spanning beyond 2 midnights from the point of admission due to high intensity of service, high risk for further deterioration and high frequency of surveillance required.    Onnie Boer MD [facility]  04/22/2021, 8:21 PM    For on call review [email or web address].

## 2021-04-23 ENCOUNTER — Ambulatory Visit: Payer: Medicare Other | Admitting: Urology

## 2021-04-23 DIAGNOSIS — K922 Gastrointestinal hemorrhage, unspecified: Secondary | ICD-10-CM | POA: Diagnosis not present

## 2021-04-23 LAB — GLUCOSE, CAPILLARY
Glucose-Capillary: 80 mg/dL (ref 70–99)
Glucose-Capillary: 74 mg/dL (ref 70–99)
Glucose-Capillary: 71 mg/dL (ref 70–99)
Glucose-Capillary: 66 mg/dL — ABNORMAL LOW (ref 70–99)
Glucose-Capillary: 69 mg/dL — ABNORMAL LOW (ref 70–99)
Glucose-Capillary: 81 mg/dL (ref 70–99)
Glucose-Capillary: 125 mg/dL — ABNORMAL HIGH (ref 70–99)

## 2021-04-23 LAB — CBC
HCT: 23.6 % — ABNORMAL LOW (ref 39.0–52.0)
Hemoglobin: 6.7 g/dL — CL (ref 13.0–17.0)
MCH: 30.7 pg (ref 26.0–34.0)
MCHC: 28.4 g/dL — ABNORMAL LOW (ref 30.0–36.0)
MCV: 108.3 fL — ABNORMAL HIGH (ref 80.0–100.0)
Platelets: 89 10*3/uL — ABNORMAL LOW (ref 150–400)
RBC: 2.18 MIL/uL — ABNORMAL LOW (ref 4.22–5.81)
RDW: 26.5 % — ABNORMAL HIGH (ref 11.5–15.5)
WBC: 2.9 10*3/uL — ABNORMAL LOW (ref 4.0–10.5)
nRBC: 1.7 % — ABNORMAL HIGH (ref 0.0–0.2)

## 2021-04-23 LAB — BASIC METABOLIC PANEL
Anion gap: 8 (ref 5–15)
BUN: 30 mg/dL — ABNORMAL HIGH (ref 8–23)
CO2: 26 mmol/L (ref 22–32)
Calcium: 8 mg/dL — ABNORMAL LOW (ref 8.9–10.3)
Chloride: 104 mmol/L (ref 98–111)
Creatinine, Ser: 4.32 mg/dL — ABNORMAL HIGH (ref 0.61–1.24)
GFR, Estimated: 14 mL/min — ABNORMAL LOW (ref >60)
Glucose, Bld: 86 mg/dL (ref 70–99)
Potassium: 3.6 mmol/L (ref 3.5–5.1)
Sodium: 138 mmol/L (ref 135–145)

## 2021-04-23 LAB — PREPARE RBC (CROSSMATCH)

## 2021-04-23 LAB — HEMOGLOBIN AND HEMATOCRIT, BLOOD
HCT: 26.2 % — ABNORMAL LOW (ref 39.0–52.0)
Hemoglobin: 7.6 g/dL — ABNORMAL LOW (ref 13.0–17.0)

## 2021-04-23 LAB — MRSA NEXT GEN BY PCR, NASAL: MRSA by PCR Next Gen: DETECTED — AB

## 2021-04-23 MED ORDER — CHLORHEXIDINE GLUCONATE CLOTH 2 % EX PADS
6.0000 | MEDICATED_PAD | Freq: Every day | CUTANEOUS | Status: DC
  Administered 2021-04-23 – 2021-04-25 (×2): 6 via TOPICAL

## 2021-04-23 MED ORDER — BISACODYL 5 MG PO TBEC: 10.0000 mg | DELAYED_RELEASE_TABLET | Freq: Every day | ORAL | Status: DC | PRN

## 2021-04-23 MED ORDER — PEG 3350-KCL-NA BICARB-NACL 420 G PO SOLR
4000.0000 mL | Freq: Once | ORAL | Status: AC
  Administered 2021-04-23: 4000 mL via ORAL

## 2021-04-23 MED ORDER — LACTULOSE 10 GM/15ML PO SOLN
20.0000 g | Freq: Every day | ORAL | Status: DC
  Administered 2021-04-23: 20 g via ORAL
  Filled 2021-04-23: qty 30

## 2021-04-23 MED ORDER — PANTOPRAZOLE SODIUM 40 MG IV SOLR
40.0000 mg | Freq: Two times a day (BID) | INTRAVENOUS | Status: DC
  Administered 2021-04-23 – 2021-04-26 (×5): 40 mg via INTRAVENOUS
  Filled 2021-04-23 (×6): qty 10

## 2021-04-23 MED ORDER — CHLORHEXIDINE GLUCONATE CLOTH 2 % EX PADS
6.0000 | MEDICATED_PAD | Freq: Every day | CUTANEOUS | Status: DC
  Administered 2021-04-24: 6 via TOPICAL

## 2021-04-23 MED ORDER — SODIUM CHLORIDE 0.9% IV SOLUTION: Freq: Once | INTRAVENOUS | Status: AC

## 2021-04-23 NOTE — Consult Note (Signed)
Referring Provider: Roxan Hockey, MD ?Primary Care Physician:  [name], [name], MD ?Primary Gastroenterologist:  Dr. Laural Golden ? ?Reason for Consultation:   ? ?Gastrointestinal bleed. ? ?HPI:  ? ?Patient is 71 year old Caucasian male was noted to have low hemoglobin at the time of hemodialysis yesterday.  Patient was therefore referred to the emergency room for further evaluation.  He has a history of GI bleed and iron deficiency anemia and has undergone EGD, 2 colonoscopies and push enteroscopy since October 2020.  Studies are summarized below. ?Past history is significant for end-stage renal disease on hemodialysis history of colon cancer about 12 years ago, cirrhosis secondary to NASH resulting in thrombocytopenia pulmonary hypertension , coronary artery disease, CHF diabetes mellitus atrial fibrillation on apixaban as well as history of obstructive sleep apnea. ? ?Patient states he did not feel any different when he went for hemodialysis yesterday.  His hemoglobin was 6.5 g.  He states he was able to finish hemodialysis before coming to the hospital.  On arrival yesterday his hemoglobin was 5.8 g.  He received a unit of PRBCs.  His hemoglobin this morning was 6.7 and he received another he unit of PRBCs.  Patient says he notices small amount of bright red blood per rectum every couple of days.  He feels it is due to hemorrhoids.  He has not experience frank rectal bleeding or melena.  He complains of generalized abdominal discomfort which he describes as soreness.  He has not experienced nausea vomiting hematemesis or hematuria.  He says he does not make much urine.  He did pass some blood in his urine over 6 months ago which was due to infection responding to antibiotic therapy.  His appetite is fair.  He has not lost any weight recently.  He is on anticoagulant and he does not take NSAIDs. ?Last apixaban dose was 2 days ago. ? ?Prior GI work-up as follows. ? ?December 2020 ? ?EGD and colonoscopy for iron  deficiency anemia and GI bleed by me. ?Esophagogastroduodenoscopy revealed gastritis.  H. pylori stains were negative. ?Colonoscopy revealed 11 polyps ranging in size from few millimeters to 16 mm and all of these polyps were tubular adenomas.  Patient's prep was fair and he was advised to return in 6 months but it did not happen. ? ?June 2022 ? ?He had push enteroscopy for iron deficiency anemia and chronic GI bleed by Dr. Arelia Longest ?Gastritis as before.  Proximal jejunum was normal. ? ?October 2022 ? ?He presented with rectal bleeding and underwent colonoscopy by Dr. Jenetta Downer. ?He was bleeding from some of these polyps.  All in all he had 16 polyps removed ranging in size from 5 to 20 mm.  Sigmoid colon polyp had foci of high-grade dysplasia.  Dr. Jenetta Downer recommended repeat exam in 3 months but it did not materialize. ? ? ?Patient is divorced.  He is now on disability.  He has been living in nursing home for 6 months.  He worked in a [employer] for 17 years and then he worked at [employer] for 30 years.  He does not drink alcohol.  He quit cigarette smoking in 1985.  He smoked anywhere from half to 2 packs/day for about 20 years.  He has 1 grownup son and 6 grandchildren.  Both parents are diseased father died of MI at 19 and mother was diabetic and on hemodialysis and lived to be 12.  Patient does not have any siblings. ? ? ? ?Past Medical History:  ?Diagnosis Date  ?  Anemia   ? CAD (coronary artery disease)   ? LHC 2/08:  mRCA 50%, o/w no sig CAD, EF 25%  ? Cardiomyopathy ([place])   ? EF 35-40% in 2/16 in setting of AF with RVR >> echo 5/16 with improved LVF with EF 65-70%  ? CHF (congestive heart failure) ([place])   ? Chronic atrial fibrillation (HCC)   ? Chronic diastolic heart failure ([place])   ? HFPF - EF ~65-70% (OFTEN EXACERBATED BY AFIB)  ? Cirrhosis of liver ([place])   ? CKD (chronic kidney disease)   ? hx of worsening renal failure and hyperK+ in setting of acute diast CHF >> required CVVHD in 4/16 and  dialysis x 1 in 5/16  ? CKD stage 3 due to type 2 diabetes mellitus ([place]) 09/17/2015  ? Colon cancer ([place])   ? Colon polyps 09/21/2012  ? Tubular adenoma  ? Diabetes ([place])   ? GERD (gastroesophageal reflux disease)   ? Hyperlipidemia   ? Hypertension   ? Hypothyroidism   ? Insomnia   ? Nonalcoholic steatohepatitis   ? Obesity hypoventilation syndrome ([place])   ? Pulmonary hypertension ([place])   ? Renal insufficiency   ? Sleep apnea   ? Thrombocytopenia ([place])   ? ? ?Past Surgical History:  ?Procedure Laterality Date  ? AV FISTULA PLACEMENT Right 03/02/2021  ? Procedure: RIGHT ARM ARTERIOVENOUS (AV) FISTULA CREATION;  Surgeon: [name], MD;  [facility];  Service: Vascular;  Laterality: Right;  ? CIRCUMCISION    ? COLON RESECTION    ? COLONOSCOPY N/A 09/21/2012  ? Procedure: COLONOSCOPY;  Surgeon: Inda Castle, MD;  [facility];  Service: Endoscopy;  Laterality: N/A;  ? COLONOSCOPY N/A 12/28/2018  ? Procedure: COLONOSCOPY;  Surgeon: Rogene Houston, MD;  [facility];  Service: Endoscopy;  Laterality: N/A;  ? COLONOSCOPY WITH PROPOFOL N/A 10/17/2020  ? Procedure: COLONOSCOPY WITH PROPOFOL;  Surgeon: Harvel Quale, MD;  [facility];  Service: Gastroenterology;  Laterality: N/A;  ? ENTEROSCOPY N/A 06/24/2020  ? Procedure: ENTEROSCOPY;  Surgeon: Gatha Mayer, MD;  Location: [facility];  Service: Endoscopy;  Laterality: N/A;  ? ESOPHAGOGASTRODUODENOSCOPY N/A 12/28/2018  ? Procedure: ESOPHAGOGASTRODUODENOSCOPY (EGD);  Surgeon: Rogene Houston, MD;  [facility];  Service: Endoscopy;  Laterality: N/A;  ? IR FLUORO GUIDE CV LINE RIGHT  12/10/2020  ? IR US GUIDE VASC ACCESS RIGHT  12/11/2020  ? POLYPECTOMY  10/17/2020  ? Procedure: POLYPECTOMY;  Surgeon: Montez Morita, Quillian Quince, MD;  [facility];  Service: Gastroenterology;;  rectal, transverse, descending, ascending, sigmoid  ? RIGHT HEART CATH N/A 04/11/2019  ? Procedure: RIGHT HEART CATH;   Surgeon: Larey Dresser, MD;  Location: [facility];  Service: Cardiovascular;  Laterality: N/A;  ? US ECHOCARDIOGRAPHY  03/04/2010  ? abnormal study  ? ? ?Prior to Admission medications   ?Medication Sig Start Date End Date Taking? Authorizing Provider  ?acetaminophen (TYLENOL) 160 MG/5ML liquid Take 650 mg by mouth every 4 (four) hours as needed for fever.   Yes [provider]  ?allopurinol (ZYLOPRIM) 100 MG tablet Take 200 mg by mouth daily.   Yes [provider]  ?Amino Acids-Protein Hydrolys (PRO-STAT) LIQD Take 30 mLs by mouth daily.   Yes [provider]  ?apixaban (ELIQUIS) 5 MG TABS tablet Take 1 tablet (5 mg total) by mouth 2 (two) times daily. 12/11/20  Yes Florencia Reasons, MD  ?atorvastatin (LIPITOR) 20 MG tablet TAKE 1  TABLET BY MOUTH EVERY DAY ?Patient taking differently: Take 20 mg by mouth at bedtime. 03/19/20  Yes [name], [name], MD  ?B Complex-C (B-COMPLEX WITH VITAMIN C) tablet Take 1 tablet by mouth daily. 12/12/20  Yes Florencia Reasons, MD  ?Cholecalciferol (VITAMIN D) 125 MCG (5000 UT) CAPS Take 5,000 Units by mouth daily.   Yes [provider]  ?ENULOSE 10 GM/15ML SOLN Take 20 g by mouth daily. 04/13/21  Yes [provider]  ?ferrous sulfate 325 (65 FE) MG tablet Take 325 mg by mouth daily with breakfast.   Yes [provider]  ?guaiFENesin (ROBITUSSIN) 100 MG/5ML liquid Take 5 mLs by mouth every 4 (four) hours as needed for cough or to loosen phlegm.   Yes [provider]  ?insulin aspart (NOVOLOG) 100 UNIT/ML injection Before each meal 3 times a day, 140-199 - 2 units, 200-250 - 4 units, 251-299 - 6 units,  300-349 - 8 units,  350 or above 10 units. ?Insulin PEN if approved, provide syringes and needles if needed. 12/11/20  Yes Florencia Reasons, MD  ?insulin detemir (LEVEMIR) 100 UNIT/ML injection Inject 16 Units into the skin at bedtime.   Yes [provider]  ?levothyroxine (SYNTHROID) 100 MCG tablet Take 100 mcg by mouth every  morning.   Yes [provider]  ?melatonin 5 MG TABS Take 5 mg by mouth at bedtime as needed (insomnia).   Yes [provider]  ?metoprolol succinate (TOPROL-XL) 25 MG 24 hr tablet Take 25 mg by m

## 2021-04-23 NOTE — NC FL2 (Signed)
?[place] MEDICAID FL2 LEVEL OF CARE SCREENING TOOL  ?  ? ?IDENTIFICATION  ?Patient Name: ?William Flores Birthdate: 05-Feb-1950 Sex: male Admission Date (Current Location): ?04/22/2021  ?[place] and [place] Number: ? [place] and Address:  ?[facility] [address], [place] ?     Provider Number: ?[id number]  ?Attending Physician Name and Address:  ?Roxan Hockey, MD ? Relative Name and Phone Number:  ?  ?   ?Current Level of Care: ?Hospital Recommended Level of Care: ?[place] Prior Approval Number: ?  ? ?Date Approved/Denied: ?  PASRR Number: ?  ? ?Discharge Plan: ?SNF ?  ? ?Current Diagnoses: ?Patient Active Problem List  ? Diagnosis Date Noted  ? Acute GI bleeding 04/22/2021  ? Gross hematuria 02/02/2021  ? Balanitis 02/02/2021  ? ESRD (end stage renal disease) on dialysis ([place]) 02/02/2021  ? Pressure injury of skin 11/25/2020  ? Acute exacerbation of CHF (congestive heart failure) ([place]) 11/22/2020  ? Elevated brain natriuretic peptide (BNP) level 11/22/2020  ? Thrombocytopenia ([place]) 11/22/2020  ? GERD (gastroesophageal reflux disease) 11/22/2020  ? Insomnia 11/22/2020  ? Ascites 11/12/2020  ? Acute blood loss anemia   ? Acquired thrombophilia ([place]) 10/14/2020  ? Liver cirrhosis secondary to NASH ([place]) 10/14/2020  ? Abdominal distension   ? Iron deficiency anemia 07/21/2020  ? AKI (acute kidney injury) ([place])   ? Syncope and collapse   ? Syncope 06/19/2020  ? Hypokalemia 06/19/2020  ? Fecal occult blood test positive 06/19/2020  ? Cor pulmonale, acute ([place]) 04/15/2019  ? Bilateral leg edema   ? Chronic renal failure   ? Acute on chronic congestive heart failure ([place]) 03/29/2019  ? Pulmonary hypertension ([place]) 12/28/2018  ? Mixed hyperlipidemia 05/18/2017  ? Morbid obesity with BMI of 45.0-49.9, adult ([place]) 05/18/2017  ? CKD (chronic kidney disease), stage III ([place]) 09/17/2015  ? MGUS (monoclonal gammopathy of unknown significance) 05/27/2015  ? Acute on  chronic anemia 11/04/2014  ? Peripheral edema   ? Essential hypertension   ? Chronic respiratory failure with hypoxia (HCC)   ? DM (diabetes mellitus), type 2 with renal complications ([place]) 06/26/7626  ? Atrial fibrillation ([place]) 04/14/2014  ? Anticoagulated on Coumadin 03/20/2014  ? Nonischemic cardiomyopathy ([place]) 02/20/2014  ? OSA (obstructive sleep apnea)   ? Right heart failure ([place]) 02/10/2014  ? Morbid obesity ([place]) 08/05/2013  ? History of colon cancer   ? Gout 05/25/2012  ? Hypertensive heart disease   ? ? ?Orientation RESPIRATION BLADDER Height & Weight   ?  ?Self, Time, Situation, Place ? O2 (see dc summary) Continent Weight: 247 lb 9.2 oz (112.3 kg) ?Height:  5' 4"  (162.6 cm)  ?BEHAVIORAL SYMPTOMS/MOOD NEUROLOGICAL BOWEL NUTRITION STATUS  ?    Continent Diet (renal diet)  ?AMBULATORY STATUS COMMUNICATION OF NEEDS Skin   ?Supervision Verbally Normal ?  ?  ?  ?    ?     ?     ? ? ?Personal Care Assistance Level of Assistance  ?Bathing, Feeding, Dressing Bathing Assistance: Limited assistance ?Feeding assistance: Independent ?Dressing Assistance: Limited assistance ?   ? ?Functional Limitations Info  ?Sight, Hearing, Speech Sight Info: Adequate ?Hearing Info: Adequate ?Speech Info: Adequate  ? ? ?SPECIAL CARE FACTORS FREQUENCY  ?    ?  ?  ?  ?  ?  ?  ?   ? ? ?Contractures Contractures Info: Not present  ? ? ?Additional Factors Info  ?Code Status, Allergies Code Status Info:  Full ?Allergies Info: Codeine ?  ?  ?  ?   ? ?Current Medications (04/23/2021):  This is the current hospital active medication list ?Current Facility-Administered Medications  ?Medication Dose Route Frequency Provider Last Rate Last Admin  ? acetaminophen (TYLENOL) tablet 650 mg  650 mg Oral Q6H PRN Emokpae, Ejiroghene E, MD      ? Or  ? acetaminophen (TYLENOL) suppository 650 mg  650 mg Rectal Q6H PRN Emokpae, Ejiroghene E, MD      ? allopurinol (ZYLOPRIM) tablet 200 mg  200 mg Oral Daily Emokpae, Ejiroghene E, MD      ? Chlorhexidine  Gluconate Cloth 2 % PADS 6 each  6 each Topical Daily Roxan Hockey, MD   6 each at 04/23/21 1117  ? insulin aspart (novoLOG) injection 0-9 Units  0-9 Units Subcutaneous Q4H Emokpae, Ejiroghene E, MD      ? lactulose (CHRONULAC) 10 GM/15ML solution 20 g  20 g Oral Daily Emokpae, Courage, MD      ? levothyroxine (SYNTHROID) tablet 100 mcg  100 mcg Oral q morning Emokpae, Ejiroghene E, MD   100 mcg at 04/23/21 0536  ? ondansetron (ZOFRAN) tablet 4 mg  4 mg Oral Q6H PRN Emokpae, Ejiroghene E, MD      ? Or  ? ondansetron (ZOFRAN) injection 4 mg  4 mg Intravenous Q6H PRN Emokpae, Ejiroghene E, MD      ? pantoprazole (PROTONIX) injection 40 mg  40 mg Intravenous Q24H Emokpae, Ejiroghene E, MD      ? polyethylene glycol (MIRALAX / GLYCOLAX) packet 17 g  17 g Oral Daily PRN Emokpae, Ejiroghene E, MD      ? sildenafil (REVATIO) tablet 20 mg  20 mg Oral TID Emokpae, Ejiroghene E, MD   20 mg at 04/22/21 2232  ? ? ? ?Discharge Medications: ?Please see discharge summary for a list of discharge medications. ? ?Relevant Imaging Results: ? ?Relevant Lab Results: ? ? ?Additional Information ?  ? ?Shade Flood, LCSW ? ? ? ? ?

## 2021-04-23 NOTE — Progress Notes (Deleted)
Assessment: 1. Gross hematuria   2. Balanitis   3. ESRD (end stage renal disease) on dialysis [facility])      Plan: Second request for all urinalysis and urine culture results from [facility] and rehab center. Please fax this information to [phone]. The patient was unable to provide a urine sample today. Please obtain a urinalysis and fax these results to the number above. Continue nystatin cream to penis twice daily x 7 days for balanitis Return to office in 6 weeks  Chief Complaint:  No chief complaint on file.   History of Present Illness:  William Flores is a 71 y.o. year old male who is seen for further evaluation of gross hematuria.  The patient has multiple medical problems and is currently on hemodialysis for end-stage renal disease.  He had an episode of gross hematuria in January 2023.  This was associated with dysuria.  No flank or abdominal pain.  No lab results available for review.  He was told he had a UTI and treated with antibiotics.  The gross hematuria subsequently resolved.  He was not having any dysuria or other urinary symptoms at his visit on 02/02/21.  He voids approximately twice per day while on hemodialysis.  No history of UTIs or kidney stones.    He does have a remote history of tobacco use but has not smoked for over 30 years. CT imaging from 10/22 showed no renal or ureteral calculi and no evidence of obstruction. IPSS = 4. He was found to have balanitis on physical examination.  He was treated with nystatin cream.  At his visit in March 2023, he reported no further episodes of gross hematuria.  He reported that a urine sample was obtained at his nursing facility with some evidence of a UTI and he was treated with some antibiotics.  No records available regarding his urine studies.  He reported some occasional dysuria.  His balanitis had improved.   Portions of the above documentation were copied from a prior visit for review purposes  only.   Past Medical History:  Past Medical History:  Diagnosis Date   Anemia    CAD (coronary artery disease)    LHC 2/08:  mRCA 50%, o/w no sig CAD, EF 25%   Cardiomyopathy ([place])    EF 35-40% in 2/16 in setting of AF with RVR >> echo 5/16 with improved LVF with EF 65-70%   CHF (congestive heart failure) (HCC)    Chronic atrial fibrillation (HCC)    Chronic diastolic heart failure (HCC)    HFPF - EF ~65-70% (OFTEN EXACERBATED BY AFIB)   Cirrhosis of liver (HCC)    CKD (chronic kidney disease)    hx of worsening renal failure and hyperK+ in setting of acute diast CHF >> required CVVHD in 4/16 and dialysis x 1 in 5/16   CKD stage 3 due to type 2 diabetes mellitus ([place]) 09/17/2015   Colon cancer ([place])    Colon polyps 09/21/2012   Tubular adenoma   Diabetes (HCC)    GERD (gastroesophageal reflux disease)    Hyperlipidemia    Hypertension    Hypothyroidism    Insomnia    Nonalcoholic steatohepatitis    Obesity hypoventilation syndrome ([place])    Pulmonary hypertension ([place])    Renal insufficiency    Sleep apnea    Thrombocytopenia ([place])     Past Surgical History:  Past Surgical History:  Procedure Laterality Date   AV FISTULA PLACEMENT Right 03/02/2021  Procedure: RIGHT ARM ARTERIOVENOUS (AV) FISTULA CREATION;  Surgeon: [name], MD;  [facility];  Service: Vascular;  Laterality: Right;   CIRCUMCISION     COLON RESECTION     COLONOSCOPY N/A 09/21/2012   Procedure: COLONOSCOPY;  Surgeon: Inda Castle, MD;  [facility];  Service: Endoscopy;  Laterality: N/A;   COLONOSCOPY N/A 12/28/2018   Procedure: COLONOSCOPY;  Surgeon: Rogene Houston, MD;  [facility];  Service: Endoscopy;  Laterality: N/A;   COLONOSCOPY WITH PROPOFOL N/A 10/17/2020   Procedure: COLONOSCOPY WITH PROPOFOL;  Surgeon: Harvel Quale, MD;  [facility];  Service: Gastroenterology;  Laterality: N/A;   ENTEROSCOPY N/A 06/24/2020   Procedure: ENTEROSCOPY;   Surgeon: Gatha Mayer, MD;  Location: [facility];  Service: Endoscopy;  Laterality: N/A;   ESOPHAGOGASTRODUODENOSCOPY N/A 12/28/2018   Procedure: ESOPHAGOGASTRODUODENOSCOPY (EGD);  Surgeon: Rogene Houston, MD;  [facility];  Service: Endoscopy;  Laterality: N/A;   IR FLUORO GUIDE CV LINE RIGHT  12/10/2020   IR US GUIDE VASC ACCESS RIGHT  12/11/2020   POLYPECTOMY  10/17/2020   Procedure: POLYPECTOMY;  Surgeon: Harvel Quale, MD;  [facility];  Service: Gastroenterology;;  rectal, transverse, descending, ascending, sigmoid   RIGHT HEART CATH N/A 04/11/2019   Procedure: RIGHT HEART CATH;  Surgeon: Larey Dresser, MD;  Location: [facility];  Service: Cardiovascular;  Laterality: N/A;   US ECHOCARDIOGRAPHY  03/04/2010   abnormal study    Allergies:  Allergies  Allergen Reactions   Codeine     Headache     Family History:  Family History  Problem Relation Age of Onset   Breast cancer Mother    Diabetes Mother    Heart attack Father     Social History:  Social History   Tobacco Use   Smoking status: Former    Types: Cigarettes    Quit date: 08/06/1983    Years since quitting: 37.7   Smokeless tobacco: Never  Vaping Use   Vaping Use: Never used  Substance Use Topics   Alcohol use: No   Drug use: No    ROS: Constitutional:  Negative for fever, chills, weight loss CV: Negative for chest pain, previous MI, hypertension Respiratory:  Negative for shortness of breath, wheezing, sleep apnea, frequent cough GI:  Negative for nausea, vomiting, bloody stool, GERD  Physical exam: There were no vitals taken for this visit. GENERAL APPEARANCE:  Well appearing, well developed, well nourished, NAD HEENT:  Atraumatic, normocephalic, oropharynx clear NECK:  Supple without lymphadenopathy or thyromegaly ABDOMEN:  Soft, non-tender, no masses EXTREMITIES:  Moves all extremities well, without clubbing, cyanosis, or edema NEUROLOGIC:  Alert  and oriented x 3, normal gait, CN II-XII grossly intact MENTAL STATUS:  appropriate BACK:  Non-tender to palpation, No CVAT SKIN:  Warm, dry, and intact  Results: U/A

## 2021-04-23 NOTE — Progress Notes (Signed)
Spoke with Dr.Lin with nephro regarding dialysis orders for patient who is due to have dialysis tomorrow, Dr.Lin reported he would submit those orders, dialysis RN Jeanene Erb) made aware & reported that she will plan to dialysis the patient early tomorrow AM ?

## 2021-04-23 NOTE — Progress Notes (Signed)
Patient asleep , NO CPap ?

## 2021-04-23 NOTE — Progress Notes (Signed)
Still awaiting hemodialysis orders for patient, HD RN is aware and here at this time, spoke with Dr.Rehman (GI) and made him aware of no HD orders yet, H&H lab ordered now as requested by Dr.Rehman, night shift made aware ?

## 2021-04-23 NOTE — Progress Notes (Addendum)
?PROGRESS NOTE ? ? ? ? ?William Flores, is a 71 y.o. male, DOB - 05-Mar-1950, VHQ:[id number] ? ?Admit date - 04/22/2021   Admitting Physician Ejiroghene Arlyce Dice, MD ? ?Outpatient Primary MD for the patient is [name], [name], MD ? ?LOS - 1 ? ?Chief Complaint  ?Patient presents with  ? Abnormal Labs  ?    ? ? ?Brief Narrative:  ? 71 y.o. male with medical history significant for multiple significant medical problems including ESRD, chronic respiratory failure on 2 L, Nash liver cirrhosis, obesity with obesity hypoventilation syndrome and OSA, diabetes mellitus, atrial fibrillation, congestive heart failure admitted on 04/22/2021 with concerns for acute GI bleed ? ?  ?-Assessment and Plan: ? ?1) acute on chronic anemia/presumed acute GI bleed --- due to acute GI bleed/acute blood loss superimposed on anemia of ESRD in the setting of liver cirrhosis with pancytopenia ?-At least 1 more unit of PRBC for total of 2 units this admission ?-GI consult appreciated plans for endoscopy on 04/24/2021 ? ?2)ESRD (end stage renal disease) on dialysis [facility]) ?Schedule Tuesday Thursday Saturday.  Reports to me that he completed HD on 04/22/2021 told charge triage nurse earlier he did not. Makes very little urine. ?-Plans for hemodialysis on 04/24/2021 ? ?Liver cirrhosis secondary to NASH [facility]) ?-Baseline pancytopenia ?-Hold lactulose and laxatives/GI prokinetics in the setting of acute GI bleed ?-Now with bleeding check INR in a.m. ? ?Pulmonary hypertension ([place]) ?Resume sildenafil ? ?DM (diabetes mellitus), type 2 with renal complications (HCC) ?- Use Novolog/Humalog Sliding scale insulin with Accu-Cheks/Fingersticks as ordered  ? ?Atrial fibrillation ([place]) ?Rate controlled rate mostly 90s.  Patient states he is on warfarin, but he is actually on Eliquis- per medication list from nursing home.  INR 1.9.  Currently in atrial fibrillation.  ?-Hold anticoagulation ? ?OSA (obstructive sleep apnea) ?OSA, OHS, morbid obesity, with chronic  respiratory failure on 2 L. ?-CPAP nightly ?-Supplemental O2 ? ?Disposition/Need for in-Hospital Stay- patient unable to be discharged at this time due to --acute blood loss anemia symptomatic requiring transfusion and endoluminal evaluation* ? ?Status is: Inpatient  ? ?Disposition: The patient is from: SNF ?             Anticipated d/c is to: SNF ?             Anticipated d/c date is: 2 days ?             Patient currently is not medically stable to d/c. ?Barriers: Not Clinically Stable-  ? ?Code Status :  -  Code Status: Full Code  ? ?Family Communication:    NA (patient is alert, awake and coherent)  ? ?DVT Prophylaxis  :   - SCDs   SCDs Start: 04/22/21 2125 ? ? ?Lab Results  ?Component Value Date  ? PLT 89 (L) 04/23/2021  ? ? ?Inpatient Medications ? ?Scheduled Meds: ? allopurinol  200 mg Oral Daily  ? Chlorhexidine Gluconate Cloth  6 each Topical Daily  ? insulin aspart  0-9 Units Subcutaneous Q4H  ? lactulose  20 g Oral Daily  ? levothyroxine  100 mcg Oral q morning  ? pantoprazole  40 mg Intravenous Q24H  ? sildenafil  20 mg Oral TID  ? ?Continuous Infusions: ?PRN Meds:.acetaminophen **OR** acetaminophen, bisacodyl, ondansetron **OR** ondansetron (ZOFRAN) IV, polyethylene glycol ? ? ?Anti-infectives (From admission, onward)  ? ? None  ? ?  ? ?  ? ?Subjective: ?William Flores today has no fevers, no emesis,  No chest pain,   ?- ?Complains  of fatigue and generalized weakness ? ?Objective: ?Vitals:  ? 04/23/21 1600 04/23/21 1700 04/23/21 1800 04/23/21 1900  ?BP: 123/88 115/82    ?Pulse: (!) 113 (!) 119 92 (!) 107  ?Resp:      ?Temp: 99.3 ?F (37.4 ?C)     ?TempSrc: Oral     ?SpO2: 100% 100% 99% 100%  ?Weight:      ?Height:      ? ? ?Intake/Output Summary (Last 24 hours) at 04/23/2021 1944 ?Last data filed at 04/23/2021 1249 ?Gross per 24 hour  ?Intake 567.33 ml  ?Output --  ?Net 567.33 ml  ? ?Filed Weights  ? 04/22/21 1341 04/22/21 2120 04/23/21 0421  ?Weight: 108 kg 112.3 kg 112.3 kg  ? ? ?Physical Exam ? ?Gen:-  Awake Alert, no acute distress ?HEENT:- [place].AT, No sclera icterus ?Neck-Supple Neck,No JVD,.  HD catheter noted ?Lungs-  CTAB , fair symmetrical air movement ?CV- S1, S2 normal, irregularly irregular ?Abd-  +ve B.Sounds, Abd Soft, No tenderness,    ?Extremity/Skin:- No  edema, pedal pulses present  ?Psych-affect is appropriate, oriented x3 ?Neuro-no new focal deficits, no tremors ? ?Data Reviewed: I have personally reviewed following labs and imaging studies ? ?CBC: ?Recent Labs  ?Lab 04/22/21 ?1419 04/22/21 ?2138 04/23/21 ?0402  ?WBC 2.5* 2.9* 2.9*  ?NEUTROABS 1.8  --   --   ?HGB 5.8* 7.0* 6.7*  ?HCT 20.7* 25.0* 23.6*  ?MCV 111.9* 108.2* 108.3*  ?PLT 93* 101* 89*  ? ?Basic Metabolic Panel: ?Recent Labs  ?Lab 04/22/21 ?1419 04/23/21 ?0402  ?NA 137 138  ?K 3.3* 3.6  ?CL 104 104  ?CO2 27 26  ?GLUCOSE 114* 86  ?BUN 24* 30*  ?CREATININE 3.49* 4.32*  ?CALCIUM 7.9* 8.0*  ? ?GFR: ?Estimated Creatinine Clearance: 18.4 mL/min (A) (by C-G formula based on SCr of 4.32 mg/dL (H)). ?Liver Function Tests: ?Recent Labs  ?Lab 04/22/21 ?1419  ?AST 35  ?ALT 28  ?ALKPHOS 68  ?BILITOT 0.7  ?PROT 6.0*  ?ALBUMIN 2.5*  ? ?Cardiac Enzymes: ?No results for input(s): CKTOTAL, CKMB, CKMBINDEX, TROPONINI in the last 168 hours. ?BNP (last 3 results) ?No results for input(s): PROBNP in the last 8760 hours. ?HbA1C: ?No results for input(s): HGBA1C in the last 72 hours. ?Sepsis Labs: ?@LABRCNTIP (procalcitonin:4,lacticidven:4) ?) ?Recent Results (from the past 240 hour(s))  ?MRSA Next Gen by PCR, Nasal     Status: Abnormal  ? Collection Time: 04/22/21  9:27 PM  ? Specimen: Nasal Mucosa; Nasal Swab  ?Result Value Ref Range Status  ? MRSA by PCR Next Gen DETECTED (A) NOT DETECTED Final  ?  Comment: RESULT CALLED TO, READ BACK BY AND VERIFIED WITH: Babs Bertin RN [phone] K FORSYTH ?(NOTE) ?The GeneXpert MRSA Assay (FDA approved for NASAL specimens only), ?is one component of a comprehensive MRSA colonization surveillance ?program. It is not intended  to diagnose MRSA infection nor to guide ?or monitor treatment for MRSA infections. ?Test performance is not FDA approved in patients less than 2 years ?old. ?Performed at [facility], [address], [place],  [address] ?  ?  ? ? ?Radiology Studies: ?No results found. ? ? ?Scheduled Meds: ? allopurinol  200 mg Oral Daily  ? Chlorhexidine Gluconate Cloth  6 each Topical Daily  ? insulin aspart  0-9 Units Subcutaneous Q4H  ? lactulose  20 g Oral Daily  ? levothyroxine  100 mcg Oral q morning  ? pantoprazole  40 mg Intravenous Q24H  ? sildenafil  20 mg Oral TID  ? ?  Continuous Infusions: ? ? LOS: 1 day  ? ? ?Roxan Hockey M.D on 04/23/2021 at 7:44 PM ? ?Go to [email or web address] - for contact info ? ?[facility] - Office  [phone] ? ?If 7PM-7AM, please contact night-coverage ?[email or web address] ?Password [email or web address] ?04/23/2021, 7:44 PM  ? ? ?

## 2021-04-23 NOTE — Progress Notes (Signed)
Hypoglycemic Event ? ?CBG: 66 ? ?Treatment: 8 oz juice/soda ? ?Symptoms: None ? ?Follow-up CBG: Time:1618 CBG Result:69 ? ?Sprite given, patient drinking.  ? ?Follow-up CBG:  Time: 1641  CBG Result: 81 ? ?Possible Reasons for Event: Inadequate meal intake ? ?Comments/MD notified:Dr Courage ?And notified Crystal M. Patient RN ? ? ?Raj Janus ? ? ?

## 2021-04-23 NOTE — TOC Initial Note (Signed)
Transition of Care (TOC) - Initial/Assessment Note  ? ? ?Patient Details  ?Name: William Flores ?MRN: [id number] ?Date of Birth: 1950-04-26 ? ?Transition of Care (TOC) CM/SW Contact:    ?Shade Flood, LCSW ?Phone Number: ?04/23/2021, 1:17 PM ? ?Clinical Narrative:                 ? ?Pt admitted from [place] [facility]. MD anticipating EDD of Monday. Plan is for return to [facility] at [facility]. Spoke with Melissa at [facility] today to update. They will accept pt back at dc. ? ?TOC will follow and assist with dc planning. ? ?Expected Discharge Plan: [place] ?Barriers to Discharge: Continued Medical Work up ? ? ?Patient Goals and CMS Choice ?Patient states their goals for this hospitalization and ongoing recovery are:: get better ?  ?  ? ?Expected Discharge Plan and Services ?Expected Discharge Plan: [place] ?In-house Referral: Clinical Social Work ?  ?Post Acute Care Choice: Resumption of Svcs/PTA Provider ?Living arrangements for the past 2 months: [place] ?                ?  ?  ?  ?  ?  ?  ?  ?  ?  ?  ? ?Prior Living Arrangements/Services ?Living arrangements for the past 2 months: [place] ?Lives with:: Facility Resident ?Patient language and need for interpreter reviewed:: Yes ?Do you feel safe going back to the place where you live?: Yes      ?Need for Family Participation in Patient Care: No (Comment) ?Care giver support system in place?: Yes (comment) ?  ?Criminal Activity/Legal Involvement Pertinent to Current Situation/Hospitalization: No - Comment as needed ? ?Activities of Daily Living ?Home Assistive Devices/Equipment: Gilford Rile (specify type), Wheelchair ?ADL Screening (condition at time of admission) ?Patient's cognitive ability adequate to safely complete daily activities?: Yes ?Is the patient deaf or have difficulty hearing?: No ?Does the patient have difficulty seeing, even when wearing glasses/contacts?: No ?Does the patient  have difficulty concentrating, remembering, or making decisions?: No ?Patient able to express need for assistance with ADLs?: Yes ?Does the patient have difficulty dressing or bathing?: No ?Independently performs ADLs?: No ?Communication: Needs assistance ?Is this a change from baseline?: Pre-admission baseline ?Dressing (OT): Needs assistance ?Is this a change from baseline?: Pre-admission baseline ?Grooming: Needs assistance ?Is this a change from baseline?: Pre-admission baseline ?Feeding: Needs assistance ?Is this a change from baseline?: Pre-admission baseline ?Bathing: Needs assistance ?Is this a change from baseline?: Pre-admission baseline ?Toileting: Needs assistance ?Is this a change from baseline?: Pre-admission baseline ?In/Out Bed: Needs assistance ?Is this a change from baseline?: Pre-admission baseline ?Walks in Home: Needs assistance ?Is this a change from baseline?: Pre-admission baseline ?Does the patient have difficulty walking or climbing stairs?: Yes ?Weakness of Legs: Both ?Weakness of Arms/Hands: Both ? ?Permission Sought/Granted ?  ?  ?   ?   ?   ?   ? ?Emotional Assessment ?  ?  ?  ?Orientation: : Oriented to Self, Oriented to Place, Oriented to  Time, Oriented to Situation ?Alcohol / Substance Use: Not Applicable ?Psych Involvement: No (comment) ? ?Admission diagnosis:  Acute GI bleeding [K92.2] ?Patient Active Problem List  ? Diagnosis Date Noted  ? Acute GI bleeding 04/22/2021  ? Gross hematuria 02/02/2021  ? Balanitis 02/02/2021  ? ESRD (end stage renal disease) on dialysis ([place]) 02/02/2021  ? Pressure injury of skin 11/25/2020  ? Acute exacerbation of CHF (  congestive heart failure) ([place]) 11/22/2020  ? Elevated brain natriuretic peptide (BNP) level 11/22/2020  ? Thrombocytopenia ([place]) 11/22/2020  ? GERD (gastroesophageal reflux disease) 11/22/2020  ? Insomnia 11/22/2020  ? Ascites 11/12/2020  ? Acute blood loss anemia   ? Acquired thrombophilia ([place]) 10/14/2020  ? Liver cirrhosis  secondary to NASH ([place]) 10/14/2020  ? Abdominal distension   ? Iron deficiency anemia 07/21/2020  ? AKI (acute kidney injury) ([place])   ? Syncope and collapse   ? Syncope 06/19/2020  ? Hypokalemia 06/19/2020  ? Fecal occult blood test positive 06/19/2020  ? Cor pulmonale, acute ([place]) 04/15/2019  ? Bilateral leg edema   ? Chronic renal failure   ? Acute on chronic congestive heart failure ([place]) 03/29/2019  ? Pulmonary hypertension ([place]) 12/28/2018  ? Mixed hyperlipidemia 05/18/2017  ? Morbid obesity with BMI of 45.0-49.9, adult ([place]) 05/18/2017  ? CKD (chronic kidney disease), stage III ([place]) 09/17/2015  ? MGUS (monoclonal gammopathy of unknown significance) 05/27/2015  ? Acute on chronic anemia 11/04/2014  ? Peripheral edema   ? Essential hypertension   ? Chronic respiratory failure with hypoxia (HCC)   ? DM (diabetes mellitus), type 2 with renal complications ([place]) 22/29/7989  ? Atrial fibrillation ([place]) 04/14/2014  ? Anticoagulated on Coumadin 03/20/2014  ? Nonischemic cardiomyopathy ([place]) 02/20/2014  ? OSA (obstructive sleep apnea)   ? Right heart failure ([place]) 02/10/2014  ? Morbid obesity ([place]) 08/05/2013  ? History of colon cancer   ? Gout 05/25/2012  ? Hypertensive heart disease   ? ?PCP:  [name], [name], MD ?Pharmacy:   ?[id number] [place], [place] - [address]?Phone: [phone]: [phone]? ?[place]?[address] [place]?Phone: [phone]: [phone]? ?[place]?[address]?Phone: [phone]: [phone]? ? ? ? ?Social Determinants of Health (SDOH) Interventions ?  ? ?Readmission Risk Interventions ? ?  04/23/2021  ?  1:14 PM 11/23/2020  ? 10:58 AM 10/24/2020  ? 12:41 PM  ?Readmission Risk Prevention Plan  ?Transportation Screening Complete Complete Complete   ?Medication Review (Press photographer Complete Complete Complete  ?PCP or Specialist appointment within 3-5 days of discharge  Complete   ?[facility] Consult Complete Complete Complete  ?SW Recovery Care/Counseling Consult Complete Complete Complete  ?Palliative Care Screening Not Applicable  Not Applicable  ?Skilled Nursing Facility Complete Complete Complete  ? ? ? ?

## 2021-04-23 NOTE — Progress Notes (Signed)
Patient refused CPAP. No unit in room at this time. 

## 2021-04-24 ENCOUNTER — Encounter (HOSPITAL_COMMUNITY)
Admission: EM | Disposition: A | Discharge status: Skilled Nursing Facility | Payer: Self-pay | Source: Skilled Nursing Facility | Attending: Family Medicine

## 2021-04-24 DIAGNOSIS — K922 Gastrointestinal hemorrhage, unspecified: Secondary | ICD-10-CM | POA: Diagnosis not present

## 2021-04-24 LAB — GLUCOSE, CAPILLARY
Glucose-Capillary: 79 mg/dL (ref 70–99)
Glucose-Capillary: 82 mg/dL (ref 70–99)
Glucose-Capillary: 87 mg/dL (ref 70–99)

## 2021-04-24 LAB — CBC
HCT: 27.5 % — ABNORMAL LOW (ref 39.0–52.0)
Hemoglobin: 7.8 g/dL — ABNORMAL LOW (ref 13.0–17.0)
MCH: 29.9 pg (ref 26.0–34.0)
MCHC: 28.4 g/dL — ABNORMAL LOW (ref 30.0–36.0)
MCV: 105.4 fL — ABNORMAL HIGH (ref 80.0–100.0)
Platelets: 86 10*3/uL — ABNORMAL LOW (ref 150–400)
RBC: 2.61 MIL/uL — ABNORMAL LOW (ref 4.22–5.81)
RDW: 27.4 % — ABNORMAL HIGH (ref 11.5–15.5)
WBC: 3.2 10*3/uL — ABNORMAL LOW (ref 4.0–10.5)
nRBC: 1.5 % — ABNORMAL HIGH (ref 0.0–0.2)

## 2021-04-24 LAB — RENAL FUNCTION PANEL
Albumin: 2.4 g/dL — ABNORMAL LOW (ref 3.5–5.0)
Anion gap: 8 (ref 5–15)
BUN: 37 mg/dL — ABNORMAL HIGH (ref 8–23)
CO2: 25 mmol/L (ref 22–32)
Calcium: 8.2 mg/dL — ABNORMAL LOW (ref 8.9–10.3)
Chloride: 103 mmol/L (ref 98–111)
Creatinine, Ser: 5.46 mg/dL — ABNORMAL HIGH (ref 0.61–1.24)
GFR, Estimated: 11 mL/min — ABNORMAL LOW (ref >60)
Glucose, Bld: 78 mg/dL (ref 70–99)
Phosphorus: 5.1 mg/dL — ABNORMAL HIGH (ref 2.5–4.6)
Potassium: 3.8 mmol/L (ref 3.5–5.1)
Sodium: 136 mmol/L (ref 135–145)

## 2021-04-24 LAB — HEPATITIS B SURFACE ANTIBODY,QUALITATIVE: Hep B S Ab: NONREACTIVE

## 2021-04-24 LAB — HEPATITIS B SURFACE ANTIGEN: Hepatitis B Surface Ag: NONREACTIVE

## 2021-04-24 LAB — PROTIME-INR
INR: 1.4 — ABNORMAL HIGH (ref 0.8–1.2)
Prothrombin Time: 17.1 seconds — ABNORMAL HIGH (ref 11.4–15.2)

## 2021-04-24 SURGERY — COLONOSCOPY WITH PROPOFOL
Anesthesia: Monitor Anesthesia Care

## 2021-04-24 MED ORDER — HEPARIN SODIUM (PORCINE) 1000 UNIT/ML DIALYSIS
1000.0000 [IU] | INTRAMUSCULAR | Status: DC | PRN
  Filled 2021-04-24: qty 1

## 2021-04-24 MED ORDER — MUPIROCIN 2 % EX OINT
1.0000 "application " | TOPICAL_OINTMENT | Freq: Two times a day (BID) | CUTANEOUS | Status: DC
  Administered 2021-04-24 – 2021-04-26 (×3): 1 via NASAL
  Filled 2021-04-24: qty 22

## 2021-04-24 MED ORDER — LIDOCAINE-PRILOCAINE 2.5-2.5 % EX CREA
1.0000 | TOPICAL_CREAM | CUTANEOUS | Status: DC | PRN
Start: 2021-04-24 — End: 2021-11-26
  Filled 2021-04-24: qty 5

## 2021-04-24 MED ORDER — PENTAFLUOROPROP-TETRAFLUOROETH EX AERO
1.0000 "application " | INHALATION_SPRAY | CUTANEOUS | Status: DC | PRN
  Filled 2021-04-24: qty 30

## 2021-04-24 MED ORDER — MIDODRINE HCL 5 MG PO TABS
10.0000 mg | ORAL_TABLET | Freq: Three times a day (TID) | ORAL | Status: AC
  Administered 2021-04-24: 10 mg via ORAL
  Filled 2021-04-24: qty 2

## 2021-04-24 MED ORDER — LIDOCAINE HCL (PF) 1 % IJ SOLN: 5.0000 mL | INTRAMUSCULAR | Status: DC | PRN

## 2021-04-24 MED ORDER — MIDODRINE HCL 5 MG PO TABS
10.0000 mg | ORAL_TABLET | Freq: Three times a day (TID) | ORAL | Status: DC
  Administered 2021-04-25 – 2021-04-26 (×4): 10 mg via ORAL
  Filled 2021-04-24 (×5): qty 2

## 2021-04-24 MED ORDER — ALBUMIN HUMAN 25 % IV SOLN: 25.0000 g | INTRAVENOUS | Status: DC | PRN

## 2021-04-24 MED ORDER — SODIUM CHLORIDE 0.9 % IV SOLN: INTRAVENOUS | Status: DC

## 2021-04-24 MED ORDER — SODIUM CHLORIDE 0.9 % IV SOLN: 100.0000 mL | INTRAVENOUS | Status: DC | PRN

## 2021-04-24 MED ORDER — ORAL CARE MOUTH RINSE
15.0000 mL | Freq: Two times a day (BID) | OROMUCOSAL | Status: DC
  Administered 2021-04-25: 15 mL via OROMUCOSAL

## 2021-04-24 MED ORDER — ALTEPLASE 2 MG IJ SOLR
2.0000 mg | Freq: Once | INTRAMUSCULAR | Status: DC | PRN
  Filled 2021-04-24: qty 2

## 2021-04-24 NOTE — Progress Notes (Signed)
?PROGRESS NOTE ? ? ? ? ?William Flores, is a 71 y.o. male, DOB - May 17, 1950, SFK:[id number] ? ?Admit date - 04/22/2021   Admitting Physician Ejiroghene Arlyce Dice, MD ? ?Outpatient Primary MD for the patient is [name], [name], MD ? ?LOS - 2 ? ?Chief Complaint  ?Patient presents with  ? Abnormal Labs  ?    ? ? ?Brief Narrative:  ? 71 y.o. male with medical history significant for multiple significant medical problems including ESRD, chronic respiratory failure on 2 L, Nash liver cirrhosis, obesity with obesity hypoventilation syndrome and OSA, diabetes mellitus, atrial fibrillation, congestive heart failure admitted on 04/22/2021 with concerns for acute GI bleed ? ?  ?-Assessment and Plan: ? ?1) acute on chronic anemia/presumed acute GI bleed --- due to acute GI bleed/acute blood loss superimposed on anemia of ESRD in the setting of liver cirrhosis with pancytopenia ?-At least 1 more unit of PRBC for total of 2 units this admission ?-GI consult appreciated ? -Patient did not complete his prep for colonoscopy rescheduled for 04/25/2021 ?-Hgb up to 7.8 from 5.8 ? ?2)ESRD (end stage renal disease) on dialysis [facility]) ?Schedule Tuesday Thursday Saturday.   ?Tolerated hemodialysis on 04/24/2021 ?-Nephrology consult appreciated ? ?Liver cirrhosis secondary to NASH [facility]) ?-Baseline pancytopenia ?-Hold lactulose and GI prokinetics in the setting of acute GI bleed--except for GoLytely prep ?-Now with GI bleed, INR 1.4, down from 1.9 on admission ? ?Pulmonary hypertension ([place]) ?-Continue sildenafil ? ?DM (diabetes mellitus), type 2 with renal complications (HCC) ?- Use Novolog/Humalog Sliding scale insulin with Accu-Cheks/Fingersticks as ordered  ? ?Atrial fibrillation ([place]) ?Rate controlled  ?- Currently in atrial fibrillation.  ?-Eliquis on hold due to GI bleed ?INR as above ? ?OSA (obstructive sleep apnea) ?OSA, OHS, morbid obesity, with chronic respiratory failure on 2 L. ?-CPAP nightly ?-Supplemental O2 ? ?Disposition/Need  for in-Hospital Stay- patient unable to be discharged at this time due to --acute blood loss anemia symptomatic requiring transfusion and endoluminal evaluation* ? ?Status is: Inpatient  ? ?Disposition: The patient is from: SNF ?             Anticipated d/c is to: SNF ?             Anticipated d/c date is: 2 days ?             Patient currently is not medically stable to d/c. ?Barriers: Not Clinically Stable-  ? ?Code Status :  -  Code Status: Full Code  ? ?Family Communication:    NA (patient is alert, awake and coherent)  ? ?DVT Prophylaxis  :   - SCDs   SCDs Start: 04/22/21 2125 ? ? ?Lab Results  ?Component Value Date  ? PLT 86 (L) 04/24/2021  ? ? ?Inpatient Medications ? ?Scheduled Meds: ? allopurinol  200 mg Oral Daily  ? Chlorhexidine Gluconate Cloth  6 each Topical Daily  ? Chlorhexidine Gluconate Cloth  6 each Topical Q0600  ? insulin aspart  0-9 Units Subcutaneous Q4H  ? levothyroxine  100 mcg Oral q morning  ? pantoprazole  40 mg Intravenous Q12H  ? sildenafil  20 mg Oral TID  ? ?Continuous Infusions: ? sodium chloride    ? albumin human    ? ?PRN Meds:.acetaminophen **OR** acetaminophen, albumin human, bisacodyl, ondansetron **OR** ondansetron (ZOFRAN) IV, polyethylene glycol ? ? ?Anti-infectives (From admission, onward)  ? ? None  ? ?  ? ? ? ?Subjective: ?William Flores today has no fevers, no emesis,  No chest pain,   ?- ?-  Did not drink much of his colon prep ?-Still has particulate brown stool ?-Regular hemodialysis well on 04/24/2021 ? ?Objective: ?Vitals:  ? 04/24/21 0533 04/24/21 0756 04/24/21 0815 04/24/21 1232  ?BP: (!) 104/59     ?Pulse: 91 90    ?Resp:      ?Temp:  (!) 97.3 ?F (36.3 ?C)  (!) 97.3 ?F (36.3 ?C)  ?TempSrc:  Axillary  Oral  ?SpO2: 95% 100%    ?Weight:   111.8 kg   ?Height:      ? ? ?Intake/Output Summary (Last 24 hours) at 04/24/2021 1633 ?Last data filed at 04/24/2021 0815 ?Gross per 24 hour  ?Intake 100 ml  ?Output 1000 ml  ?Net -900 ml  ? ?Filed Weights  ? 04/23/21 0421 04/24/21 0445  04/24/21 0815  ?Weight: 112.3 kg 112.8 kg 111.8 kg  ? ?Physical Exam ?Gen:- Awake Alert, no acute distress ?HEENT:- [place].AT, No sclera icterus ?Neck-Supple Neck,No JVD,.  HD catheter noted ?Lungs-  CTAB , fair symmetrical air movement ?CV- S1, S2 normal, irregularly irregular ?Abd-  +ve B.Sounds, Abd Soft, No significant tenderness,    ?Extremity/Skin:- No  edema, pedal pulses present  ?Psych-affect is appropriate, oriented x3 ?Neuro-no new focal deficits, no tremors ? ?Data Reviewed: I have personally reviewed following labs and imaging studies ? ?CBC: ?Recent Labs  ?Lab 04/22/21 ?1419 04/22/21 ?2138 04/23/21 ?0402 04/23/21 ?1932 04/24/21 ?0425  ?WBC 2.5* 2.9* 2.9*  --  3.2*  ?NEUTROABS 1.8  --   --   --   --   ?HGB 5.8* 7.0* 6.7* 7.6* 7.8*  ?HCT 20.7* 25.0* 23.6* 26.2* 27.5*  ?MCV 111.9* 108.2* 108.3*  --  105.4*  ?PLT 93* 101* 89*  --  86*  ? ?Basic Metabolic Panel: ?Recent Labs  ?Lab 04/22/21 ?1419 04/23/21 ?0402 04/24/21 ?0425  ?NA 137 138 136  ?K 3.3* 3.6 3.8  ?CL 104 104 103  ?CO2 27 26 25   ?GLUCOSE 114* 86 78  ?BUN 24* 30* 37*  ?CREATININE 3.49* 4.32* 5.46*  ?CALCIUM 7.9* 8.0* 8.2*  ?PHOS  --   --  5.1*  ? ?GFR: ?Estimated Creatinine Clearance: 14.5 mL/min (A) (by C-G formula based on SCr of 5.46 mg/dL (H)). ?Liver Function Tests: ?Recent Labs  ?Lab 04/22/21 ?1419 04/24/21 ?0425  ?AST 35  --   ?ALT 28  --   ?ALKPHOS 68  --   ?BILITOT 0.7  --   ?PROT 6.0*  --   ?ALBUMIN 2.5* 2.4*  ? ?Cardiac Enzymes: ?No results for input(s): CKTOTAL, CKMB, CKMBINDEX, TROPONINI in the last 168 hours. ?BNP (last 3 results) ?No results for input(s): PROBNP in the last 8760 hours. ?HbA1C: ?No results for input(s): HGBA1C in the last 72 hours. ?Sepsis Labs: ?@LABRCNTIP (procalcitonin:4,lacticidven:4) ?) ?Recent Results (from the past 240 hour(s))  ?MRSA Next Gen by PCR, Nasal     Status: Abnormal  ? Collection Time: 04/22/21  9:27 PM  ? Specimen: Nasal Mucosa; Nasal Swab  ?Result Value Ref Range Status  ? MRSA by PCR Next Gen  DETECTED (A) NOT DETECTED Final  ?  Comment: RESULT CALLED TO, READ BACK BY AND VERIFIED WITH: Babs Bertin RN [phone] K FORSYTH ?(NOTE) ?The GeneXpert MRSA Assay (FDA approved for NASAL specimens only), ?is one component of a comprehensive MRSA colonization surveillance ?program. It is not intended to diagnose MRSA infection nor to guide ?or monitor treatment for MRSA infections. ?Test performance is not FDA approved in patients less than 2 years ?old. ?Performed at [facility], [place]  [address], [place], [place] [address] ?  ?  ?Radiology Studies: ?No results found. ? ? ?Scheduled Meds: ? allopurinol  200 mg Oral Daily  ? Chlorhexidine Gluconate Cloth  6 each Topical Daily  ? Chlorhexidine Gluconate Cloth  6 each Topical Q0600  ? insulin aspart  0-9 Units Subcutaneous Q4H  ? levothyroxine  100 mcg Oral q morning  ? pantoprazole  40 mg Intravenous Q12H  ? sildenafil  20 mg Oral TID  ? ?Continuous Infusions: ? sodium chloride    ? albumin human    ? ? ? LOS: 2 days  ? ?Roxan Hockey M.D on 04/24/2021 at 4:33 PM ? ?Go to [email or web address] - for contact info ? ?[facility] - Office  [phone] ? ?If 7PM-7AM, please contact night-coverage ?[email or web address] ?Password [email or web address] ?04/24/2021, 4:33 PM  ? ? ?

## 2021-04-24 NOTE — Progress Notes (Signed)
Patient just completed dialysis, spoke with Manuela Schwartz in [place] made aware that patient is ready for colonoscopy at this time ?

## 2021-04-24 NOTE — Progress Notes (Signed)
Patient has no complaints. ?He states abdominal soreness has decreased. ?He drank less than one fourth of GoLytely. ?Stool has been loose but not clear. ?Hemoglobin is up to 7.8 g. ?He did undergo hemodialysis this morning. ?Patient has been rescheduled for colonoscopy to performed on 04/25/2020. ?

## 2021-04-24 NOTE — Progress Notes (Signed)
Patient had more black stool overnight, currently being dialyzed at this time & due to be completed at 8AM, Hgb 7.8 this AM ?

## 2021-04-24 NOTE — Progress Notes (Signed)
Consent for colonoscopy with propofol obtained & placed in patient's chart  ?

## 2021-04-24 NOTE — Plan of Care (Addendum)
Patient remains on [place] O2. Oral care initiated per protocol by this RN this shift (was not previously initiated). CVC which was present on arrival had not been documented, so that was documented and assessed by this RN. Verbal order given by Dr. Josephine Cables to hold patient's PM dose of Revatio tonight due to soft BP. Fall precaution bundle also initiated by this RN (was not previously initiated). MRSA positive standing orders initiated by this RN (positive on 04/22/21 but not previously addressed). Furthermore, patient has completed his bowel prep and his stools appear to contain small blood clots but are otherwise clear. ? ? ?Problem: Education: ?Goal: Knowledge of General Education information will improve ?Description: Including pain rating scale, medication(s)/side effects and non-pharmacologic comfort measures ?Outcome: Progressing ?  ?Problem: Health Behavior/Discharge Planning: ?Goal: Ability to manage health-related needs will improve ?Outcome: Progressing ?  ?Problem: Clinical Measurements: ?Goal: Ability to maintain clinical measurements within normal limits will improve ?Outcome: Progressing ?Goal: Will remain free from infection ?Outcome: Progressing ?Goal: Diagnostic test results will improve ?Outcome: Progressing ?Goal: Respiratory complications will improve ?Outcome: Progressing ?Goal: Cardiovascular complication will be avoided ?Outcome: Progressing ?  ?Problem: Activity: ?Goal: Risk for activity intolerance will decrease ?Outcome: Progressing ?  ?Problem: Nutrition: ?Goal: Adequate nutrition will be maintained ?Outcome: Progressing ?  ?Problem: Coping: ?Goal: Level of anxiety will decrease ?Outcome: Progressing ?  ?Problem: Elimination: ?Goal: Will not experience complications related to bowel motility ?Outcome: Progressing ?Goal: Will not experience complications related to urinary retention ?Outcome: Progressing ?  ?Problem: Pain Managment: ?Goal: General experience of comfort will improve ?Outcome:  Progressing ?  ?Problem: Safety: ?Goal: Ability to remain free from injury will improve ?Outcome: Progressing ?  ?Problem: Skin Integrity: ?Goal: Risk for impaired skin integrity will decrease ?Outcome: Progressing ?  ?

## 2021-04-24 NOTE — Progress Notes (Signed)
Pt scheduled for colonoscopy, drank 1/4 of prep.  Pt and nurse unsure of how last stool looked.  Informed Dr. Laural Golden, case cancelled and will be rescheduled for tomorrow AM.  Per Dr. Laural Golden clear liquids today and continue drinking prep.  Pt and nurse, Crystal, informed and verbalized understanding.   ?

## 2021-04-24 NOTE — Consult Note (Signed)
Reason for Consult: ESRD ?Referring Physician:  Dr. Denton Brick ? ?Chief Complaint: Symptomatic anemia ? ?[facility] ?TTS 4hr EDW 108kg ?400/500 2/2.5 T35 ?Heparin 0 Mircera 245mg q2weeks (last 4/12) ?Left at 113.8kg on 4/20 ? ?Assessment/Plan: ?ESRD - tolerated dialysis late last night with only 1L net UF tolerated. ?- no absolute indication for RRT and the patient appears to be  comfortable. ?- plan next HD for Monday if he's still here; well above his EDW; may need 4 treatments next week to get to EDW as often UF limited by hypotension. ? ?Additional recommendations ?- Dose all meds for creatinine clearance < 15 ml/min  on dialysis ?- Unless absolutely necessary, no MRIs with gadolinium.  ?- Prefer needle sticks in the dorsum of the hands or wrists.  No blood pressure measurements in right arm. ?- If blood transfusion is requested during hemodialysis sessions, please alert u[place]aprior to the session.  ?- If a hemodialysis catheter line culture is requested, please alert u[place]as only hemodialysis nurses are able to collect those specimens.  ? ?-Monitor Daily I/Os, Daily weight  ?-Maintain MAP>65 for optimal renal perfusion.  ?-Avoid nephrotoxic medications including NSAIDs ? ?Anemia secondary to GIB with planned colonoscopy and also started on Protonix. Last Mircera 2036m given 4/12. Will also given another Mircera 20027mthis hospitalization if he's still here on Tuesday. ?Transfuse as needed; already transfused 1U and will require another unit.  ?Inadequate prep and colonoscopy rescheduled for tomorrow AM. ?Renal osteodystrophy - will check a phosphorus for binder management. ?Liver cirrhosis secondary to NASH with thrombocytopenia at baseline. ?DM ?Atrial fibrillation rate controlled ?OSA ?  ?HPI: William Flores an [age]. male O2 dependent at home (2L), NASH liver cirrhosis, obesity hypoventilation syndrome, OSA, DM, atrial fibrillation, CHF, [facility]  leaving at 113.8kg (above his listed EDW of 108kg) limited by hypotension. Patient is presenting from his dialysis center with a hemoglobin noted to be 6.5 with bleeding from his rectum with bright red blood for 4-5 days. He denies any black tarry stool but endorses fatigue. He denies fever, chills, chest pain and is on Eliquis. Patient noted to have a Hb of 5.8 in the ED with platelets of 93 and started on Protonix with planned colonoscopy.  ? ?ROS ?Pertinent items are noted in HPI. ? ?Chemistry and CBC: ?Creat  ?Date/Time Value Ref Range Status  ?11/12/2020 03:37 PM 2.22 (H) 0.70 - 1.35 mg/dL Final  ?05/25/2012 03:56 PM 1.12 0.50 - 1.35 mg/dL Final  ? ?Creatinine, Ser  ?Date/Time Value Ref Range Status  ?04/24/2021 04:25 AM 5.46 (H) 0.61 - 1.24 mg/dL Final  ?04/23/2021 04:02 AM 4.32 (H) 0.61 - 1.24 mg/dL Final  ?04/22/2021 02:19 PM 3.49 (H) 0.61 - 1.24 mg/dL Final  ?03/02/2021 08:31 AM 8.30 (H) 0.61 - 1.24 mg/dL Final  ?12/09/2020 09:53 AM 5.01 (H) 0.61 - 1.24 mg/dL Final  ?12/08/2020 10:55 AM 3.87 (H) 0.61 - 1.24 mg/dL Final  ?12/07/2020 06:49 AM 3.80 (H) 0.61 - 1.24 mg/dL Final  ?12/06/2020 04:11 AM 2.44 (H) 0.61 - 1.24 mg/dL Final  ?12/05/2020 04:10 AM 2.86 (H) 0.61 - 1.24 mg/dL Final  ?12/04/2020 05:45 PM 2.63 (H) 0.61 - 1.24 mg/dL Final  ?12/04/2020 04:18 AM 1.81 (H) 0.61 - 1.24 mg/dL Final  ?12/03/2020 05:12 PM 1.81 (H) 0.61 - 1.24 mg/dL Final  ?12/03/2020 10:00 AM 1.82 (H) 0.61 - 1.24 mg/dL Final  ?12/03/2020 05:04 AM 1.95 (H) 0.61 - 1.24 mg/dL Final  ?  12/02/2020 01:38 PM 1.41 (H) 0.61 - 1.24 mg/dL Final  ?12/02/2020 04:58 AM 1.36 (H) 0.61 - 1.24 mg/dL Final  ?12/01/2020 08:57 PM 1.47 (H) 0.61 - 1.24 mg/dL Final  ?12/01/2020 03:35 AM 1.55 (H) 0.61 - 1.24 mg/dL Final  ?11/30/2020 03:10 PM 1.64 (H) 0.61 - 1.24 mg/dL Final  ?11/30/2020 03:20 AM 1.81 (H) 0.61 - 1.24 mg/dL Final  ?11/29/2020 09:51 PM 1.90 (H) 0.61 - 1.24 mg/dL Final  ?11/29/2020 04:00 PM 1.69 (H) 0.61 - 1.24 mg/dL Final  ?11/29/2020 03:20 AM  1.68 (H) 0.61 - 1.24 mg/dL Final  ?11/28/2020 04:00 PM 1.71 (H) 0.61 - 1.24 mg/dL Final  ?11/28/2020 03:16 AM 2.00 (H) 0.61 - 1.24 mg/dL Final  ?11/27/2020 04:38 PM 2.43 (H) 0.61 - 1.24 mg/dL Final  ?11/27/2020 05:00 AM 2.82 (H) 0.61 - 1.24 mg/dL Final  ?11/26/2020 04:50 AM 2.46 (H) 0.61 - 1.24 mg/dL Final  ?11/25/2020 02:55 AM 2.48 (H) 0.61 - 1.24 mg/dL Final  ?11/24/2020 06:20 AM 2.43 (H) 0.61 - 1.24 mg/dL Final  ?11/23/2020 04:22 AM 2.28 (H) 0.61 - 1.24 mg/dL Final  ?11/22/2020 04:24 AM 2.08 (H) 0.61 - 1.24 mg/dL Final  ?11/20/2020 11:28 AM 2.09 (H) 0.61 - 1.24 mg/dL Final  ?10/24/2020 04:33 AM 2.29 (H) 0.61 - 1.24 mg/dL Final  ?10/23/2020 05:21 AM 2.04 (H) 0.61 - 1.24 mg/dL Final  ?10/22/2020 04:42 AM 1.95 (H) 0.61 - 1.24 mg/dL Final  ?10/21/2020 06:28 AM 1.86 (H) 0.61 - 1.24 mg/dL Final  ?10/20/2020 06:56 AM 2.35 (H) 0.61 - 1.24 mg/dL Final  ?10/19/2020 12:38 AM 2.22 (H) 0.61 - 1.24 mg/dL Final  ?10/18/2020 05:32 AM 2.20 (H) 0.61 - 1.24 mg/dL Final  ?10/17/2020 04:28 AM 2.32 (H) 0.61 - 1.24 mg/dL Final  ?10/16/2020 05:29 AM 2.49 (H) 0.61 - 1.24 mg/dL Final  ?10/15/2020 05:38 AM 2.70 (H) 0.61 - 1.24 mg/dL Final  ?10/14/2020 11:02 AM 2.68 (H) 0.61 - 1.24 mg/dL Final  ?08/26/2020 03:17 PM 1.85 (H) 0.76 - 1.27 mg/dL Final  ?08/19/2020 02:19 PM 2.09 (H) 0.61 - 1.24 mg/dL Final  ?07/23/2020 10:52 AM 2.18 (H) 0.61 - 1.24 mg/dL Final  ?06/25/2020 01:57 AM 2.50 (H) 0.61 - 1.24 mg/dL Final  ?06/23/2020 01:51 AM 2.27 (H) 0.61 - 1.24 mg/dL Final  ?06/22/2020 09:34 AM 2.38 (H) 0.61 - 1.24 mg/dL Final  ?06/21/2020 02:22 AM 2.41 (H) 0.61 - 1.24 mg/dL Final  ?06/20/2020 02:48 AM 2.23 (H) 0.61 - 1.24 mg/dL Final  ? ?Recent Labs  ?Lab 04/22/21 ?1419 04/23/21 ?0402 04/24/21 ?0425  ?NA 137 138 136  ?K 3.3* 3.6 3.8  ?CL 104 104 103  ?CO2 27 26 25   ?GLUCOSE 114* 86 78  ?BUN 24* 30* 37*  ?CREATININE 3.49* 4.32* 5.46*  ?CALCIUM 7.9* 8.0* 8.2*  ?PHOS  --   --  5.1*  ? ?Recent Labs  ?Lab 04/22/21 ?1419 04/22/21 ?2138 04/23/21 ?0402  04/23/21 ?1932 04/24/21 ?0425  ?WBC 2.5* 2.9* 2.9*  --  3.2*  ?NEUTROABS 1.8  --   --   --   --   ?HGB 5.8* 7.0* 6.7* 7.6* 7.8*  ?HCT 20.7* 25.0* 23.6* 26.2* 27.5*  ?MCV 111.9* 108.2* 108.3*  --  105.4*  ?PLT 93* 101* 89*  --  86*  ? ?Liver Function Tests: ?Recent Labs  ?Lab 04/22/21 ?1419 04/24/21 ?0425  ?AST 35  --   ?ALT 28  --   ?ALKPHOS 68  --   ?BILITOT 0.7  --   ?PROT 6.0*  --   ?ALBUMIN 2.5* 2.4*  ? ?  No results for input(s): LIPASE, AMYLASE in the last 168 hours. ?No results for input(s): AMMONIA in the last 168 hours. ?Cardiac Enzymes: ?No results for input(s): CKTOTAL, CKMB, CKMBINDEX, TROPONINI in the last 168 hours. ?Iron Studies:  ?Recent Labs  ?  04/22/21 ?1500  ?IRON 79  ?TIBC 314  ?FERRITIN 135  ? ?PT/INR: ?@LABRCNTIP (inr:5) ? ?Xrays/Other Studies: ?) ?Results for orders placed or performed during the hospital encounter of 04/22/21 (from the past 48 hour(s))  ?Type and screen [facility]     Status: None (Preliminary result)  ? Collection Time: 04/22/21  2:19 PM  ?Result Value Ref Range  ? ABO/RH(D) O POS   ? Antibody Screen NEG   ? Sample Expiration 04/25/2021,2359   ? Unit Number [id number]   ? Blood Component Type RED CELLS,LR   ? Unit division 00   ? Status of Unit ISSUED,FINAL   ? Transfusion Status OK TO TRANSFUSE   ? Crossmatch Result Compatible   ? Unit Number [id number]   ? Blood Component Type RED CELLS,LR   ? Unit division 00   ? Status of Unit REL FROM [facility]   ? Transfusion Status OK TO TRANSFUSE   ? Crossmatch Result Compatible   ? Unit Number [id number]   ? Blood Component Type RED CELLS,LR   ? Unit division 00   ? Status of Unit ISSUED   ? Transfusion Status OK TO TRANSFUSE   ? Crossmatch Result    ?  Compatible ?Performed at [facility], [address] [place], [place] [address] ?  ?Comprehensive metabolic panel     Status: Abnormal  ? Collection Time: 04/22/21  2:19 PM  ?Result Value Ref Range  ? Sodium 137 135 - 145 mmol/L  ? Potassium 3.3 (L) 3.5 - 5.1 mmol/L  ?  Chloride 104 98 - 111 mmol/L  ? CO2 27 22 - 32 mmol/L  ? Glucose, Bld 114 (H) 70 - 99 mg/dL  ?  Comment: Glucose reference range applies only to samples taken after fasting for at least 8 hours.  ? BUN 24 (H)

## 2021-04-25 ENCOUNTER — Inpatient Hospital Stay (HOSPITAL_COMMUNITY): Payer: Medicare Other | Admitting: Anesthesiology

## 2021-04-25 ENCOUNTER — Encounter (HOSPITAL_COMMUNITY)
Admission: EM | Disposition: A | Discharge status: Skilled Nursing Facility | Payer: Self-pay | Source: Skilled Nursing Facility | Attending: Family Medicine

## 2021-04-25 DIAGNOSIS — K922 Gastrointestinal hemorrhage, unspecified: Secondary | ICD-10-CM | POA: Diagnosis not present

## 2021-04-25 HISTORY — PX: HOT HEMOSTASIS: SHX5433

## 2021-04-25 HISTORY — PX: COLONOSCOPY WITH PROPOFOL: SHX5780

## 2021-04-25 LAB — CBC
HCT: 24.7 % — ABNORMAL LOW (ref 39.0–52.0)
Hemoglobin: 7.2 g/dL — ABNORMAL LOW (ref 13.0–17.0)
MCH: 31.2 pg (ref 26.0–34.0)
MCHC: 29.1 g/dL — ABNORMAL LOW (ref 30.0–36.0)
MCV: 106.9 fL — ABNORMAL HIGH (ref 80.0–100.0)
Platelets: 65 10*3/uL — ABNORMAL LOW (ref 150–400)
RBC: 2.31 MIL/uL — ABNORMAL LOW (ref 4.22–5.81)
RDW: 26.5 % — ABNORMAL HIGH (ref 11.5–15.5)
WBC: 2.4 10*3/uL — ABNORMAL LOW (ref 4.0–10.5)
nRBC: 0.8 % — ABNORMAL HIGH (ref 0.0–0.2)

## 2021-04-25 LAB — BPAM RBC
Blood Product Expiration Date: 202305262359
Blood Product Expiration Date: 202305262359
Blood Product Expiration Date: 202305262359
ISSUE DATE / TIME: 202304201638
ISSUE DATE / TIME: 202304211022
Unit Type and Rh: 5100
Unit Type and Rh: 5100
Unit Type and Rh: 5100

## 2021-04-25 LAB — TYPE AND SCREEN
ABO/RH(D): O POS
Antibody Screen: NEGATIVE
Unit division: 0
Unit division: 0
Unit division: 0

## 2021-04-25 LAB — GLUCOSE, CAPILLARY
Glucose-Capillary: 76 mg/dL (ref 70–99)
Glucose-Capillary: 74 mg/dL (ref 70–99)
Glucose-Capillary: 72 mg/dL (ref 70–99)
Glucose-Capillary: 60 mg/dL — ABNORMAL LOW (ref 70–99)
Glucose-Capillary: 84 mg/dL (ref 70–99)
Glucose-Capillary: 73 mg/dL (ref 70–99)
Glucose-Capillary: 89 mg/dL (ref 70–99)
Glucose-Capillary: 117 mg/dL — ABNORMAL HIGH (ref 70–99)

## 2021-04-25 LAB — HEPATITIS B SURFACE ANTIBODY, QUANTITATIVE: Hep B S AB Quant (Post): <3.1 m[IU]/mL — ABNORMAL LOW (ref >9.9)

## 2021-04-25 SURGERY — COLONOSCOPY WITH PROPOFOL
Anesthesia: General

## 2021-04-25 MED ORDER — DEXTROSE 50 % IV SOLN
INTRAVENOUS | Status: AC
  Filled 2021-04-25: qty 50

## 2021-04-25 MED ORDER — PROPOFOL 10 MG/ML IV BOLUS
INTRAVENOUS | Status: AC
  Filled 2021-04-25: qty 20

## 2021-04-25 MED ORDER — SODIUM CHLORIDE 0.9 % IV SOLN: INTRAVENOUS | Status: DC | PRN

## 2021-04-25 MED ORDER — SODIUM CHLORIDE FLUSH 0.9 % IV SOLN
INTRAVENOUS | Status: AC
  Filled 2021-04-25: qty 10

## 2021-04-25 MED ORDER — DEXTROSE 50 % IV SOLN
25.0000 mL | Freq: Once | INTRAVENOUS | Status: AC
  Administered 2021-04-25: 25 mL via INTRAVENOUS

## 2021-04-25 MED ORDER — PROPOFOL 10 MG/ML IV BOLUS
INTRAVENOUS | Status: DC | PRN
Start: 2021-04-25 — End: 2021-04-25
  Administered 2021-04-25: 200 mg via INTRAVENOUS

## 2021-04-25 MED ORDER — SODIUM CHLORIDE 0.9 % IV SOLN: INTRAVENOUS | Status: DC

## 2021-04-25 MED ORDER — STERILE WATER FOR IRRIGATION IR SOLN
Status: DC | PRN
  Administered 2021-04-25: 120 mL

## 2021-04-25 NOTE — Progress Notes (Signed)
Brief colonoscopy note. ? ?Normal mucosa of neoterminal ileum. ?Patent ileocolonic anastomosis. ?3 small polyps in the transverse colon were not removed. ?Active bleeding noted from Dieulafoy lesion at splenic flexure.  Lesion ablated with APC with hemostasis. ?Normal mucosa of descending and sigmoid colon. ?Normal rectal mucosa. ?Small external hemorrhoids. ?

## 2021-04-25 NOTE — Anesthesia Preprocedure Evaluation (Signed)
Anesthesia Evaluation  ?Patient identified by MRN, date of birth, ID band ?Patient awake ? ? ? ?Reviewed: ?Allergy & Precautions, H&P , NPO status , Patient's Chart, lab work & pertinent test results, reviewed documented beta blocker date and time  ? ?Airway ?Mallampati: II ? ?TM Distance: >3 FB ?Neck ROM: full ? ? ? Dental ?no notable dental hx. ? ?  ?Pulmonary ?sleep apnea , former smoker,  ?  ?Pulmonary exam normal ?breath sounds clear to auscultation ? ? ? ? ? ? Cardiovascular ?Exercise Tolerance: Good ?hypertension, negative cardio ROS ? ? ?Rhythm:regular Rate:Normal ? ? ?  ?Neuro/Psych ?negative neurological ROS ? negative psych ROS  ? GI/Hepatic ?GERD  Medicated,(+) Cirrhosis  ?  ?  ? , Hepatitis -  ?Endo/Other  ?negative endocrine ROSdiabetes, Poorly Controlled, Type 2 ? Renal/GU ?ESRF and DialysisRenal disease  ?negative genitourinary ?  ?Musculoskeletal ? ? Abdominal ?  ?Peds ? Hematology ? ?(+) Blood dyscrasia, anemia ,   ?Anesthesia Other Findings ? ? Reproductive/Obstetrics ?negative OB ROS ? ?  ? ? ? ? ? ? ? ? ? ? ? ? ? ?  ?  ? ? ? ? ? ? ? ? ?Anesthesia Physical ?Anesthesia Plan ? ?ASA: 3 and emergent ? ?Anesthesia Plan: General  ? ?Post-op Pain Management:   ? ?Induction:  ? ?PONV Risk Score and Plan: Propofol infusion ? ?Airway Management Planned:  ? ?Additional Equipment:  ? ?Intra-op Plan:  ? ?Post-operative Plan:  ? ?Informed Consent: I have reviewed the patients History and Physical, chart, labs and discussed the procedure including the risks, benefits and alternatives for the proposed anesthesia with the patient or authorized representative who has indicated his/her understanding and acceptance.  ? ? ? ?Dental Advisory Given ? ?Plan Discussed with: CRNA ? ?Anesthesia Plan Comments:   ? ? ? ? ? ? ?Anesthesia Quick Evaluation ? ?

## 2021-04-25 NOTE — Anesthesia Postprocedure Evaluation (Signed)
Anesthesia Post Note ? ?Patient: William Flores ? ?Procedure(s) Performed: COLONOSCOPY WITH PROPOFOL ?HOT HEMOSTASIS (ARGON PLASMA COAGULATION/BICAP) ? ?Patient location during evaluation: Phase II ?Anesthesia Type: General ?Level of consciousness: awake ?Pain management: pain level controlled ?Vital Signs Assessment: post-procedure vital signs reviewed and stable ?Respiratory status: spontaneous breathing and respiratory function stable ?Cardiovascular status: blood pressure returned to baseline and stable ?Postop Assessment: no headache and no apparent nausea or vomiting ?Anesthetic complications: no ?Comments: Late entry ? ? ?No notable events documented. ? ? ?Last Vitals:  ?Vitals:  ? 04/25/21 0936 04/25/21 0945  ?BP: (!) 75/58 (!) 74/57  ?Pulse: 91 92  ?Resp: (!) 21 20  ?Temp: 36.9 ?C   ?SpO2: 95% 94%  ?  ?Last Pain:  ?Vitals:  ? 04/25/21 0936  ?TempSrc:   ?PainSc: Asleep  ? ? ?  ?  ?  ?  ?  ?  ? ?Louann Sjogren ? ? ? ? ?

## 2021-04-25 NOTE — Transfer of Care (Signed)
Immediate Anesthesia Transfer of Care Note ? ?Patient: William Flores ? ?Procedure(s) Performed: COLONOSCOPY WITH PROPOFOL ?HOT HEMOSTASIS (ARGON PLASMA COAGULATION/BICAP) ? ?Patient Location: PACU ? ?Anesthesia Type:General ? ?Level of Consciousness: drowsy ? ?Airway & Oxygen Therapy: Patient Spontanous Breathing and Patient connected to nasal cannula oxygen ? ?Post-op Assessment: Report given to RN and Post -op Vital signs reviewed and stable ? ?Post vital signs: Reviewed and stable ? ?Last Vitals:  ?Vitals Value Taken Time  ?BP    ?Temp    ?Pulse    ?Resp 19 04/25/21 0933  ?SpO2    ?Vitals shown include unvalidated device data. ? ?Last Pain:  ?Vitals:  ? 04/25/21 0903  ?TempSrc:   ?PainSc: 0-No pain  ?   ? ?  ? ?Complications: No notable events documented. ?

## 2021-04-25 NOTE — Progress Notes (Signed)
?Subjective: ? ?Patient says he is passing clear stool along with some fresh blood.  He says abdominal soreness is resolved.  He does not feel his abdomen is more distended today than yesterday.  He denies chest pain or shortness of breath.  He is hungry. ? ?Current Medications: ? ?Current Facility-Administered Medications:  ?  0.9 %  sodium chloride infusion, , Intravenous, Continuous, Joycie Aerts U, MD ?  acetaminophen (TYLENOL) tablet 650 mg, 650 mg, Oral, Q6H PRN **OR** acetaminophen (TYLENOL) suppository 650 mg, 650 mg, Rectal, Q6H PRN, Emokpae, Ejiroghene E, MD ?  albumin human 25 % solution 25 g, 25 g, Intravenous, PRN, Dwana Melena, MD ?  allopurinol (ZYLOPRIM) tablet 200 mg, 200 mg, Oral, Daily, Emokpae, Ejiroghene E, MD, 200 mg at 04/24/21 1357 ?  bisacodyl (DULCOLAX) EC tablet 10 mg, 10 mg, Oral, Daily PRN, Tynisa Vohs, Mechele Dawley, MD ?  Chlorhexidine Gluconate Cloth 2 % PADS 6 each, 6 each, Topical, Daily, Denton Brick, Courage, MD, 6 each at 04/23/21 1117 ?  Chlorhexidine Gluconate Cloth 2 % PADS 6 each, 6 each, Topical, Q0600, Dwana Melena, MD, 6 each at 04/24/21 1358 ?  insulin aspart (novoLOG) injection 0-9 Units, 0-9 Units, Subcutaneous, Q4H, Emokpae, Ejiroghene E, MD, 1 Units at 04/23/21 2107 ?  levothyroxine (SYNTHROID) tablet 100 mcg, 100 mcg, Oral, q morning, Emokpae, Ejiroghene E, MD, 100 mcg at 04/25/21 0515 ?  MEDLINE mouth rinse, 15 mL, Mouth Rinse, BID, Emokpae, Courage, MD ?  midodrine (PROAMATINE) tablet 10 mg, 10 mg, Oral, TID WC, Adefeso, [place], DO ?  mupirocin ointment (BACTROBAN) 2 % 1 application., 1 application., Nasal, BID, Emokpae, Courage, MD, 1 application. at 04/24/21 2214 ?  ondansetron (ZOFRAN) tablet 4 mg, 4 mg, Oral, Q6H PRN **OR** ondansetron (ZOFRAN) injection 4 mg, 4 mg, Intravenous, Q6H PRN, Emokpae, Ejiroghene E, MD ?  pantoprazole (PROTONIX) injection 40 mg, 40 mg, Intravenous, Q12H, Emokpae, Courage, MD, 40 mg at 04/24/21 2211 ?  polyethylene glycol (MIRALAX / GLYCOLAX)  packet 17 g, 17 g, Oral, Daily PRN, Emokpae, Ejiroghene E, MD ?  sildenafil (REVATIO) tablet 20 mg, 20 mg, Oral, TID, Emokpae, Ejiroghene E, MD, 20 mg at 04/23/21 2103 ? ?Facility-Administered Medications Ordered in Other Encounters:  ?  0.9 %  sodium chloride infusion, 100 mL, Intravenous, PRN, Dwana Melena, MD ?  0.9 %  sodium chloride infusion, 100 mL, Intravenous, PRN, Dwana Melena, MD ?  alteplase (CATHFLO ACTIVASE) injection 2 mg, 2 mg, Intracatheter, Once PRN, Dwana Melena, MD ?  heparin injection 1,000 Units, 1,000 Units, Dialysis, PRN, Dwana Melena, MD ?  lidocaine (PF) (XYLOCAINE) 1 % injection 5 mL, 5 mL, Intradermal, PRN, Dwana Melena, MD ?  lidocaine-prilocaine (EMLA) cream 1 application., 1 application., Topical, PRN, Dwana Melena, MD ?  pentafluoroprop-tetrafluoroeth Landry Dyke) aerosol 1 application., 1 application., Topical, PRN, Dwana Melena, MD ? ?Objective: ?Blood pressure 95/62, pulse 93, temperature 98.5 ?F (36.9 ?C), temperature source Oral, resp. rate 17, height 5' 4"  (1.626 m), weight 114.2 kg, SpO2 100 %. ?Patient is alert and in no acute distress. ?He appears pale. ?Cardiac exam with irregular rhythm normal S1 and S2. No murmur or gallop noted. ?Lungs are clear to auscultation. ?Abdomen is distended.  Midline scar along with umbilical hernia which is reducible and nontender.  On palpation abdomen is somewhat tense but nontender.  No organomegaly or masses. ?No LE edema or clubbing noted. ? ?Labs/studies Results: ? ? ? ?  Latest Ref Rng & Units 04/24/2021  ?  4:25 AM 04/23/2021  ?  7:32 PM 04/23/2021  ?  4:02 AM  ?CBC  ?WBC 4.0 - 10.5 K/uL 3.2    2.9    ?Hemoglobin 13.0 - 17.0 g/dL 7.8   7.6   6.7    ?Hematocrit 39.0 - 52.0 % 27.5   26.2   23.6    ?Platelets 150 - 400 K/uL 86    89    ?  ? ?  Latest Ref Rng & Units 04/24/2021  ?  4:25 AM 04/23/2021  ?  4:02 AM 04/22/2021  ?  2:19 PM  ?CMP  ?Glucose 70 - 99 mg/dL 78   86   114    ?BUN 8 - 23 mg/dL 37   30   24    ?Creatinine 0.61 - 1.24 mg/dL 5.46    4.32   3.49    ?Sodium 135 - 145 mmol/L 136   138   137    ?Potassium 3.5 - 5.1 mmol/L 3.8   3.6   3.3    ?Chloride 98 - 111 mmol/L 103   104   104    ?CO2 22 - 32 mmol/L 25   26   27     ?Calcium 8.9 - 10.3 mg/dL 8.2   8.0   7.9    ?Total Protein 6.5 - 8.1 g/dL   6.0    ?Total Bilirubin 0.3 - 1.2 mg/dL   0.7    ?Alkaline Phos 38 - 126 U/L   68    ?AST 15 - 41 U/L   35    ?ALT 0 - 44 U/L   28    ?  ? ?  Latest Ref Rng & Units 04/24/2021  ?  4:25 AM 04/22/2021  ?  2:19 PM 12/09/2020  ?  9:53 AM  ?Hepatic Function  ?Total Protein 6.5 - 8.1 g/dL  6.0     ?Albumin 3.5 - 5.0 g/dL 2.4   2.5   3.3    ?AST 15 - 41 U/L  35     ?ALT 0 - 44 U/L  28     ?Alk Phosphatase 38 - 126 U/L  68     ?Total Bilirubin 0.3 - 1.2 mg/dL  0.7     ?  ? ?Assessment: ? ?#1.  Rectal bleeding.  He is still passing small amount of fresh blood with his bowel movements.  H&H from this morning not done. ?He has history of colon cancer and history of multiple colonic adenomas and one with foci of high-grade dysplasia found on colonoscopy of October 2022.  Bleeding possibly hemorrhoidal. ? ?#2.  End-stage renal disease.  Patient was dialyzed yesterday.  Next hemodialysis due on Tuesday. ? ?#3.  Atrial fibrillation.  He remains with controlled ventricular rate.  Anticoagulant is on hold. ? ?#4.  Cirrhosis secondary to NASH.  I wonder if his ascites is reaccumulating. ? ?#5.  Pulmonary hypertension.  Patient on sildenafil. ? ?#6 diabetes mellitus.  Patient is on NovoLog via sliding scale coverage ? ? ?Plan: ? ?CBC. ?Proceed with colonoscopy under monitored anesthesia care. ? ? ? ? ? ?

## 2021-04-25 NOTE — Progress Notes (Signed)
?PROGRESS NOTE ? ? ? ? ?William Flores, is a 71 y.o. male, DOB - 1950/12/04, JYN:[id number] ? ?Admit date - 04/22/2021   Admitting Physician Ejiroghene Arlyce Dice, MD ? ?Outpatient Primary MD for the patient is [name], [name], MD ? ?LOS - 3 ? ?Chief Complaint  ?Patient presents with  ? Abnormal Labs  ?    ? ? ?Brief Narrative:  ? 71 y.o. male with medical history significant for multiple significant medical problems including ESRD, chronic respiratory failure on 2 L, [place] liver cirrhosis, obesity with obesity hypoventilation syndrome and OSA, diabetes mellitus, atrial fibrillation, congestive heart failure admitted on 04/22/2021 with concerns for acute GI bleed ? ?  ?-Assessment and Plan: ? ?1) acute on chronic anemia/presumed acute GI bleed --- due to acute GI bleed/acute blood loss superimposed on anemia of ESRD in the setting of liver cirrhosis with pancytopenia ?-Received a total of 2 units of PRBC this admission ?---GI consult appreciated ?-Colonoscopy on 04/25/2021 :-Blood in the rectum, in the sigmoid colon and in the descending colon. ?                          - A single bleeding colonic Dieulafoy lesion.  ?                          Treated with argon plasma coagulation (APC). ?                          - The examined portion of the ileum was normal. ?                          - Three small polyps in the transverse colon. these  ?                          polyps were not removed. ?                          - External hemorrhoids. ?- ?Follow H&H and transfuse as clinically indicated, Hgb currently 7.2 ? ?2)ESRD (end stage renal disease) on dialysis [facility]) ?Schedule Tuesday Thursday Saturday.   ?Tolerated hemodialysis on 04/24/2021 ?-Nephrology consult appreciated ? ?Liver cirrhosis secondary to NASH [facility]) ?-Baseline pancytopenia ?-Hold lactulose and GI prokinetics in the setting of acute GI bleed-- ?-Now with GI bleed, INR 1.4, down from 1.9 on admission ?-Platelets drifting down due to platelet consumption in  the setting of acute GI bleed ? ?Pulmonary hypertension ([place]) ?-Continue sildenafil ? ?DM (diabetes mellitus), type 2 with renal complications (HCC) ?- Use Novolog/Humalog Sliding scale insulin with Accu-Cheks/Fingersticks as ordered  ? ?Atrial fibrillation ([place]) ?Rate controlled  ?- Currently in atrial fibrillation.  ?-Eliquis on hold due to GI bleed ?INR as above ? ?OSA (obstructive sleep apnea) ?OSA, OHS, morbid obesity, with chronic respiratory failure on 2 L. ?-CPAP nightly ?-Supplemental O2 ? ?Disposition/Need for in-Hospital Stay- patient unable to be discharged at this time due to --acute blood loss anemia symptomatic requiring transfusion and endoluminal evaluation* ? ?Status is: Inpatient  ? ?Disposition: The patient is from: SNF ?             Anticipated d/c is to: SNF ?             Anticipated d/c date is: 2 days ?  Patient currently is not medically stable to d/c. ?Barriers: Not Clinically Stable-  ? ?Procedures:- ?Colonoscopy on 04/25/2021 :-Blood in the rectum, in the sigmoid colon and in the descending colon. ?                          - A single bleeding colonic Dieulafoy lesion.  ?                          Treated with argon plasma coagulation (APC). ?                          - The examined portion of the ileum was normal. ?                          - Three small polyps in the transverse colon. these  ?                          polyps were not removed. ?                          - External hemorrhoids. ? ?Code Status :  -  Code Status: Full Code  ? ?Family Communication:    NA (patient is alert, awake and coherent)  ? ?DVT Prophylaxis  :   - SCDs   SCDs Start: 04/22/21 2125 ? ? ?Lab Results  ?Component Value Date  ? PLT 65 (L) 04/25/2021  ? ?Inpatient Medications ? ?Scheduled Meds: ? allopurinol  200 mg Oral Daily  ? Chlorhexidine Gluconate Cloth  6 each Topical Daily  ? Chlorhexidine Gluconate Cloth  6 each Topical Q0600  ? insulin aspart  0-9 Units Subcutaneous Q4H  ? levothyroxine   100 mcg Oral q morning  ? mouth rinse  15 mL Mouth Rinse BID  ? midodrine  10 mg Oral TID WC  ? mupirocin ointment  1 application. Nasal BID  ? pantoprazole  40 mg Intravenous Q12H  ? sildenafil  20 mg Oral TID  ? ?Continuous Infusions: ? albumin human    ? ?PRN Meds:.acetaminophen **OR** acetaminophen, albumin human, bisacodyl, ondansetron **OR** ondansetron (ZOFRAN) IV, polyethylene glycol ? ? ?Anti-infectives (From admission, onward)  ? ? None  ? ?  ? ?Subjective: ?Mauri Tolen today has no fevers, no emesis,  No chest pain,   ?- ?-Tolerated colonoscopy well ?--- Feels hungry ? ?Objective: ?Vitals:  ? 04/25/21 0945 04/25/21 0948 04/25/21 1112 04/25/21 1238  ?BP: (!) 74/57 (!) 78/58  (!) 104/58  ?Pulse: 92 93 97 91  ?Resp: 20 (!) 24 19 (!) 21  ?Temp:   98.5 ?F (36.9 ?C)   ?TempSrc:   Oral   ?SpO2: 94% 97% 100% 100%  ?Weight:      ?Height:      ? ?No intake or output data in the 24 hours ending 04/25/21 1340 ? ?Filed Weights  ? 04/24/21 0445 04/24/21 0815 04/25/21 0500  ?Weight: 112.8 kg 111.8 kg 114.2 kg  ? ?Physical Exam ?Gen:- Awake Alert, no acute distress ?HEENT:- [place].AT, No sclera icterus ?Neck-Supple Neck,No JVD,.  HD catheter noted ?Lungs-  CTAB , fair symmetrical air movement ?CV- S1, S2 normal, irregularly irregular ?Abd-  +ve B.Sounds, Abd Soft, No significant tenderness,    ?Extremity/Skin:- No  edema, pedal pulses present  ?Psych-affect  is appropriate, oriented x3 ?Neuro-no new focal deficits, no tremors ? ?Data Reviewed: I have personally reviewed following labs and imaging studies ? ?CBC: ?Recent Labs  ?Lab 04/22/21 ?1419 04/22/21 ?2138 04/23/21 ?0402 04/23/21 ?1932 04/24/21 ?0425 04/25/21 ?1114  ?WBC 2.5* 2.9* 2.9*  --  3.2* 2.4*  ?NEUTROABS 1.8  --   --   --   --   --   ?HGB 5.8* 7.0* 6.7* 7.6* 7.8* 7.2*  ?HCT 20.7* 25.0* 23.6* 26.2* 27.5* 24.7*  ?MCV 111.9* 108.2* 108.3*  --  105.4* 106.9*  ?PLT 93* 101* 89*  --  86* 65*  ? ?Basic Metabolic Panel: ?Recent Labs  ?Lab 04/22/21 ?1419 04/23/21 ?0402  04/24/21 ?0425  ?NA 137 138 136  ?K 3.3* 3.6 3.8  ?CL 104 104 103  ?CO2 27 26 25   ?GLUCOSE 114* 86 78  ?BUN 24* 30* 37*  ?CREATININE 3.49* 4.32* 5.46*  ?CALCIUM 7.9* 8.0* 8.2*  ?PHOS  --   --  5.1*  ? ?GFR: ?Estimated Creatinine Clearance: 14.7 mL/min (A) (by C-G formula based on SCr of 5.46 mg/dL (H)). ?Liver Function Tests: ?Recent Labs  ?Lab 04/22/21 ?1419 04/24/21 ?0425  ?AST 35  --   ?ALT 28  --   ?ALKPHOS 68  --   ?BILITOT 0.7  --   ?PROT 6.0*  --   ?ALBUMIN 2.5* 2.4*  ? ?Cardiac Enzymes: ?No results for input(s): CKTOTAL, CKMB, CKMBINDEX, TROPONINI in the last 168 hours. ?BNP (last 3 results) ?No results for input(s): PROBNP in the last 8760 hours. ?HbA1C: ?No results for input(s): HGBA1C in the last 72 hours. ?Sepsis Labs: ?@LABRCNTIP (procalcitonin:4,lacticidven:4) ?) ?Recent Results (from the past 240 hour(s))  ?MRSA Next Gen by PCR, Nasal     Status: Abnormal  ? Collection Time: 04/22/21  9:27 PM  ? Specimen: Nasal Mucosa; Nasal Swab  ?Result Value Ref Range Status  ? MRSA by PCR Next Gen DETECTED (A) NOT DETECTED Final  ?  Comment: RESULT CALLED TO, READ BACK BY AND VERIFIED WITH: Babs Bertin RN [phone] K [place] ?(NOTE) ?The GeneXpert MRSA Assay (FDA approved for NASAL specimens only), ?is one component of a comprehensive MRSA colonization surveillance ?program. It is not intended to diagnose MRSA infection nor to guide ?or monitor treatment for MRSA infections. ?Test performance is not FDA approved in patients less than 2 years ?old. ?Performed at [facility], [address] [place], [place] [address] ?  ?  ?Radiology Studies: ?No results found. ? ?Scheduled Meds: ? allopurinol  200 mg Oral Daily  ? Chlorhexidine Gluconate Cloth  6 each Topical Daily  ? Chlorhexidine Gluconate Cloth  6 each Topical Q0600  ? insulin aspart  0-9 Units Subcutaneous Q4H  ? levothyroxine  100 mcg Oral q morning  ? mouth rinse  15 mL Mouth Rinse BID  ? midodrine  10 mg Oral TID WC  ? mupirocin ointment  1 application. Nasal  BID  ? pantoprazole  40 mg Intravenous Q12H  ? sildenafil  20 mg Oral TID  ? ?Continuous Infusions: ? albumin human    ? ? LOS: 3 days  ? ?Roxan Hockey M.D on 04/25/2021 at 1:40 PM ? ?Go to [email or web address]

## 2021-04-25 NOTE — Op Note (Signed)
[facility] ?Patient Name: William Flores ?Procedure Date: 04/25/2021 8:26 AM ?MRN: [id number] ?Date of Birth: May 01, 1950 ?Attending MD: Hildred Laser , MD ?CSN: [id number] ?Age: 71 ?Admit Type: Outpatient ?Procedure:                Colonoscopy ?Indications:              Rectal bleeding ?Providers:                Hildred Laser, MD, Caprice Kluver, Aram Candela ?Referring MD:             Roxan Hockey, MD ?Medicines:                Propofol per Anesthesia ?Complications:            No immediate complications. ?Estimated Blood Loss:     Estimated blood loss: none. ?Procedure:                Pre-Anesthesia Assessment: ?                          - Prior to the procedure, a History and Physical  ?                          was performed, and patient medications and  ?                          allergies were reviewed. The patient's tolerance of  ?                          previous anesthesia was also reviewed. The risks  ?                          and benefits of the procedure and the sedation  ?                          options and risks were discussed with the patient.  ?                          All questions were answered, and informed consent  ?                          was obtained. Prior Anticoagulants: The patient  ?                          last took Eliquis (apixaban) 3 days prior to the  ?                          procedure. ASA Grade Assessment: III - A patient  ?                          with severe systemic disease. After reviewing the  ?                          risks and benefits, the patient was deemed in  ?  satisfactory condition to undergo the procedure. ?                          After obtaining informed consent, the colonoscope  ?                          was passed under direct vision. Throughout the  ?                          procedure, the patient's blood pressure, pulse, and  ?                          oxygen saturations were monitored continuously. The  ?                           [id number] ([id number]) was introduced through the  ?                          anus and advanced to the the ileocolonic  ?                          anastomosis. The colonoscopy was performed without  ?                          difficulty. The patient tolerated the procedure  ?                          well. The quality of the bowel preparation was  ?                          adequate. The terminal ileum was photographed. ?Scope In: 9:06:40 AM ?Scope Out: 9:28:06 AM ?Total Procedure Duration: 0 hours 21 minutes 26 seconds  ?Findings: ?     The perianal and digital rectal examinations were normal. ?     Red blood was found in the rectum, in the sigmoid colon and in the  ?     descending colon. ?     A single small Diuelafoy lesion with bleeding was found at the splenic  ?     flexure. Coagulation for hemostasis using argon plasma was successful. ?     The neo-terminal ileum appeared normal. ?     Three sessile polyps were found in the transverse colon. The polyps were  ?     small in size. ?     External hemorrhoids were found during retroflexion. The hemorrhoids  ?     were small. ?Impression:               - Blood in the rectum, in the sigmoid colon and in  ?                          the descending colon. ?                          - A single bleeding colonic Dieulafoy lesion.  ?  Treated with argon plasma coagulation (APC). ?                          - The examined portion of the ileum was normal. ?                          - Three small polyps in the transverse colon. these  ?                          polyps were not removed. ?                          - External hemorrhoids. ?                          - No specimens collected. ?Moderate Sedation: ?     Per Anesthesia Care ?Recommendation:           - Patient has a contact number available for  ?                          emergencies. The signs and symptoms of potential  ?                          delayed complications were discussed  with the  ?                          patient. Return to normal activities tomorrow.  ?                          Written discharge instructions were provided to the  ?                          patient. ?                          - Return patient to ICU for ongoing care. ?                          - Diabetic (ADA) diet today. ?                          - Continue present medications. ?                          - Resume anti coagulant in 72 hours. ?Procedure Code(s):        --- Professional --- ?                          [phone], Colonoscopy, flexible; with control of  ?                          bleeding, any method ?Diagnosis Code(s):        --- Professional --- ?                          K62.5, Hemorrhage  of anus and rectum ?                          K92.2, Gastrointestinal hemorrhage, unspecified ?                          K55.21, Angiodysplasia of colon with hemorrhage ?                          K64.4, Residual hemorrhoidal skin tags ?                          K63.5, Polyp of colon ?CPT copyright 2019 American Medical Association. All rights reserved. ?The codes documented in this report are preliminary and upon coder review may  ?be revised to meet current compliance requirements. ?Hildred Laser, MD ?Hildred Laser, MD ?04/25/2021 9:42:18 AM ?This report has been signed electronically. ?Number of Addenda: 0 ?

## 2021-04-26 ENCOUNTER — Encounter (HOSPITAL_COMMUNITY): Payer: Medicare Other | Admitting: Cardiology

## 2021-04-26 ENCOUNTER — Telehealth: Payer: Self-pay | Admitting: Gastroenterology

## 2021-04-26 DIAGNOSIS — K5900 Constipation, unspecified: Secondary | ICD-10-CM | POA: Diagnosis not present

## 2021-04-26 DIAGNOSIS — E1121 Type 2 diabetes mellitus with diabetic nephropathy: Secondary | ICD-10-CM | POA: Diagnosis not present

## 2021-04-26 DIAGNOSIS — I50810 Right heart failure, unspecified: Secondary | ICD-10-CM | POA: Diagnosis not present

## 2021-04-26 DIAGNOSIS — R570 Cardiogenic shock: Secondary | ICD-10-CM | POA: Diagnosis not present

## 2021-04-26 DIAGNOSIS — J9611 Chronic respiratory failure with hypoxia: Secondary | ICD-10-CM | POA: Diagnosis not present

## 2021-04-26 DIAGNOSIS — J9621 Acute and chronic respiratory failure with hypoxia: Secondary | ICD-10-CM | POA: Diagnosis not present

## 2021-04-26 DIAGNOSIS — M545 Low back pain, unspecified: Secondary | ICD-10-CM | POA: Diagnosis not present

## 2021-04-26 DIAGNOSIS — R112 Nausea with vomiting, unspecified: Secondary | ICD-10-CM | POA: Diagnosis not present

## 2021-04-26 DIAGNOSIS — Z515 Encounter for palliative care: Secondary | ICD-10-CM | POA: Diagnosis not present

## 2021-04-26 DIAGNOSIS — Z7401 Bed confinement status: Secondary | ICD-10-CM | POA: Diagnosis not present

## 2021-04-26 DIAGNOSIS — R6521 Severe sepsis with septic shock: Secondary | ICD-10-CM | POA: Diagnosis not present

## 2021-04-26 DIAGNOSIS — Z66 Do not resuscitate: Secondary | ICD-10-CM | POA: Diagnosis not present

## 2021-04-26 DIAGNOSIS — K920 Hematemesis: Secondary | ICD-10-CM | POA: Diagnosis not present

## 2021-04-26 DIAGNOSIS — I2721 Secondary pulmonary arterial hypertension: Secondary | ICD-10-CM | POA: Diagnosis not present

## 2021-04-26 DIAGNOSIS — E1129 Type 2 diabetes mellitus with other diabetic kidney complication: Secondary | ICD-10-CM | POA: Diagnosis not present

## 2021-04-26 DIAGNOSIS — K219 Gastro-esophageal reflux disease without esophagitis: Secondary | ICD-10-CM | POA: Diagnosis not present

## 2021-04-26 DIAGNOSIS — E662 Morbid (severe) obesity with alveolar hypoventilation: Secondary | ICD-10-CM | POA: Diagnosis not present

## 2021-04-26 DIAGNOSIS — R109 Unspecified abdominal pain: Secondary | ICD-10-CM | POA: Diagnosis not present

## 2021-04-26 DIAGNOSIS — R110 Nausea: Secondary | ICD-10-CM | POA: Diagnosis not present

## 2021-04-26 DIAGNOSIS — J90 Pleural effusion, not elsewhere classified: Secondary | ICD-10-CM | POA: Diagnosis not present

## 2021-04-26 DIAGNOSIS — Z992 Dependence on renal dialysis: Secondary | ICD-10-CM | POA: Diagnosis not present

## 2021-04-26 DIAGNOSIS — I509 Heart failure, unspecified: Secondary | ICD-10-CM | POA: Diagnosis not present

## 2021-04-26 DIAGNOSIS — N390 Urinary tract infection, site not specified: Secondary | ICD-10-CM | POA: Diagnosis not present

## 2021-04-26 DIAGNOSIS — M5136 Other intervertebral disc degeneration, lumbar region: Secondary | ICD-10-CM | POA: Diagnosis not present

## 2021-04-26 DIAGNOSIS — D696 Thrombocytopenia, unspecified: Secondary | ICD-10-CM | POA: Diagnosis not present

## 2021-04-26 DIAGNOSIS — J9601 Acute respiratory failure with hypoxia: Secondary | ICD-10-CM | POA: Diagnosis not present

## 2021-04-26 DIAGNOSIS — R1010 Upper abdominal pain, unspecified: Secondary | ICD-10-CM | POA: Diagnosis not present

## 2021-04-26 DIAGNOSIS — G9341 Metabolic encephalopathy: Secondary | ICD-10-CM | POA: Diagnosis not present

## 2021-04-26 DIAGNOSIS — Z85038 Personal history of other malignant neoplasm of large intestine: Secondary | ICD-10-CM | POA: Diagnosis not present

## 2021-04-26 DIAGNOSIS — K7581 Nonalcoholic steatohepatitis (NASH): Secondary | ICD-10-CM | POA: Diagnosis not present

## 2021-04-26 DIAGNOSIS — R778 Other specified abnormalities of plasma proteins: Secondary | ICD-10-CM | POA: Diagnosis not present

## 2021-04-26 DIAGNOSIS — R0609 Other forms of dyspnea: Secondary | ICD-10-CM | POA: Diagnosis not present

## 2021-04-26 DIAGNOSIS — I4891 Unspecified atrial fibrillation: Secondary | ICD-10-CM | POA: Diagnosis not present

## 2021-04-26 DIAGNOSIS — N1832 Chronic kidney disease, stage 3b: Secondary | ICD-10-CM | POA: Diagnosis not present

## 2021-04-26 DIAGNOSIS — J9602 Acute respiratory failure with hypercapnia: Secondary | ICD-10-CM | POA: Diagnosis not present

## 2021-04-26 DIAGNOSIS — E039 Hypothyroidism, unspecified: Secondary | ICD-10-CM | POA: Diagnosis not present

## 2021-04-26 DIAGNOSIS — N186 End stage renal disease: Secondary | ICD-10-CM | POA: Diagnosis not present

## 2021-04-26 DIAGNOSIS — I10 Essential (primary) hypertension: Secondary | ICD-10-CM | POA: Diagnosis not present

## 2021-04-26 DIAGNOSIS — I517 Cardiomegaly: Secondary | ICD-10-CM | POA: Diagnosis not present

## 2021-04-26 DIAGNOSIS — K922 Gastrointestinal hemorrhage, unspecified: Secondary | ICD-10-CM | POA: Diagnosis not present

## 2021-04-26 DIAGNOSIS — K7200 Acute and subacute hepatic failure without coma: Secondary | ICD-10-CM | POA: Diagnosis not present

## 2021-04-26 DIAGNOSIS — K625 Hemorrhage of anus and rectum: Secondary | ICD-10-CM | POA: Diagnosis not present

## 2021-04-26 DIAGNOSIS — J449 Chronic obstructive pulmonary disease, unspecified: Secondary | ICD-10-CM | POA: Diagnosis not present

## 2021-04-26 DIAGNOSIS — J9622 Acute and chronic respiratory failure with hypercapnia: Secondary | ICD-10-CM | POA: Diagnosis not present

## 2021-04-26 DIAGNOSIS — R52 Pain, unspecified: Secondary | ICD-10-CM | POA: Diagnosis not present

## 2021-04-26 DIAGNOSIS — K644 Residual hemorrhoidal skin tags: Secondary | ICD-10-CM | POA: Diagnosis not present

## 2021-04-26 DIAGNOSIS — I272 Pulmonary hypertension, unspecified: Secondary | ICD-10-CM | POA: Diagnosis not present

## 2021-04-26 DIAGNOSIS — I2510 Atherosclerotic heart disease of native coronary artery without angina pectoris: Secondary | ICD-10-CM | POA: Diagnosis not present

## 2021-04-26 DIAGNOSIS — D649 Anemia, unspecified: Secondary | ICD-10-CM | POA: Diagnosis not present

## 2021-04-26 DIAGNOSIS — I5032 Chronic diastolic (congestive) heart failure: Secondary | ICD-10-CM | POA: Diagnosis not present

## 2021-04-26 DIAGNOSIS — R770 Abnormality of albumin: Secondary | ICD-10-CM | POA: Diagnosis not present

## 2021-04-26 DIAGNOSIS — A419 Sepsis, unspecified organism: Secondary | ICD-10-CM | POA: Diagnosis not present

## 2021-04-26 DIAGNOSIS — D509 Iron deficiency anemia, unspecified: Secondary | ICD-10-CM | POA: Diagnosis not present

## 2021-04-26 DIAGNOSIS — Z7189 Other specified counseling: Secondary | ICD-10-CM | POA: Diagnosis not present

## 2021-04-26 DIAGNOSIS — K7460 Unspecified cirrhosis of liver: Secondary | ICD-10-CM | POA: Diagnosis not present

## 2021-04-26 DIAGNOSIS — Z20822 Contact with and (suspected) exposure to covid-19: Secondary | ICD-10-CM | POA: Diagnosis not present

## 2021-04-26 DIAGNOSIS — I50812 Chronic right heart failure: Secondary | ICD-10-CM | POA: Diagnosis not present

## 2021-04-26 DIAGNOSIS — D631 Anemia in chronic kidney disease: Secondary | ICD-10-CM | POA: Diagnosis not present

## 2021-04-26 DIAGNOSIS — R1111 Vomiting without nausea: Secondary | ICD-10-CM | POA: Diagnosis not present

## 2021-04-26 DIAGNOSIS — I482 Chronic atrial fibrillation, unspecified: Secondary | ICD-10-CM | POA: Diagnosis not present

## 2021-04-26 DIAGNOSIS — D6959 Other secondary thrombocytopenia: Secondary | ICD-10-CM | POA: Diagnosis not present

## 2021-04-26 DIAGNOSIS — Z79899 Other long term (current) drug therapy: Secondary | ICD-10-CM | POA: Diagnosis not present

## 2021-04-26 DIAGNOSIS — E782 Mixed hyperlipidemia: Secondary | ICD-10-CM | POA: Diagnosis not present

## 2021-04-26 DIAGNOSIS — I959 Hypotension, unspecified: Secondary | ICD-10-CM | POA: Diagnosis not present

## 2021-04-26 DIAGNOSIS — R531 Weakness: Secondary | ICD-10-CM | POA: Diagnosis not present

## 2021-04-26 DIAGNOSIS — J189 Pneumonia, unspecified organism: Secondary | ICD-10-CM | POA: Diagnosis not present

## 2021-04-26 DIAGNOSIS — N250 Renal osteodystrophy: Secondary | ICD-10-CM | POA: Diagnosis not present

## 2021-04-26 DIAGNOSIS — D62 Acute posthemorrhagic anemia: Secondary | ICD-10-CM | POA: Diagnosis not present

## 2021-04-26 DIAGNOSIS — E1122 Type 2 diabetes mellitus with diabetic chronic kidney disease: Secondary | ICD-10-CM | POA: Diagnosis not present

## 2021-04-26 DIAGNOSIS — I5033 Acute on chronic diastolic (congestive) heart failure: Secondary | ICD-10-CM | POA: Diagnosis not present

## 2021-04-26 DIAGNOSIS — I132 Hypertensive heart and chronic kidney disease with heart failure and with stage 5 chronic kidney disease, or end stage renal disease: Secondary | ICD-10-CM | POA: Diagnosis not present

## 2021-04-26 DIAGNOSIS — R5381 Other malaise: Secondary | ICD-10-CM | POA: Diagnosis not present

## 2021-04-26 LAB — CBC WITH DIFFERENTIAL/PLATELET
Abs Immature Granulocytes: 0.02 10*3/uL (ref 0.00–0.07)
Basophils Absolute: 0 10*3/uL (ref 0.0–0.1)
Basophils Relative: 0 %
Eosinophils Absolute: 0.1 10*3/uL (ref 0.0–0.5)
Eosinophils Relative: 2 %
HCT: 25.4 % — ABNORMAL LOW (ref 39.0–52.0)
Hemoglobin: 7.3 g/dL — ABNORMAL LOW (ref 13.0–17.0)
Immature Granulocytes: 1 %
Lymphocytes Relative: 14 %
Lymphs Abs: 0.4 10*3/uL — ABNORMAL LOW (ref 0.7–4.0)
MCH: 30.3 pg (ref 26.0–34.0)
MCHC: 28.7 g/dL — ABNORMAL LOW (ref 30.0–36.0)
MCV: 105.4 fL — ABNORMAL HIGH (ref 80.0–100.0)
Monocytes Absolute: 0.5 10*3/uL (ref 0.1–1.0)
Monocytes Relative: 17 %
Neutro Abs: 1.8 10*3/uL (ref 1.7–7.7)
Neutrophils Relative %: 66 %
Platelets: 68 10*3/uL — ABNORMAL LOW (ref 150–400)
RBC: 2.41 MIL/uL — ABNORMAL LOW (ref 4.22–5.81)
RDW: 25.9 % — ABNORMAL HIGH (ref 11.5–15.5)
WBC: 2.8 10*3/uL — ABNORMAL LOW (ref 4.0–10.5)
nRBC: 0.7 % — ABNORMAL HIGH (ref 0.0–0.2)

## 2021-04-26 LAB — GLUCOSE, CAPILLARY
Glucose-Capillary: 113 mg/dL — ABNORMAL HIGH (ref 70–99)
Glucose-Capillary: 65 mg/dL — ABNORMAL LOW (ref 70–99)
Glucose-Capillary: 66 mg/dL — ABNORMAL LOW (ref 70–99)
Glucose-Capillary: 69 mg/dL — ABNORMAL LOW (ref 70–99)
Glucose-Capillary: 75 mg/dL (ref 70–99)
Glucose-Capillary: 78 mg/dL (ref 70–99)
Glucose-Capillary: 86 mg/dL (ref 70–99)
Glucose-Capillary: 94 mg/dL (ref 70–99)

## 2021-04-26 LAB — RENAL FUNCTION PANEL
Albumin: 2.2 g/dL — ABNORMAL LOW (ref 3.5–5.0)
Anion gap: 8 (ref 5–15)
BUN: 31 mg/dL — ABNORMAL HIGH (ref 8–23)
CO2: 25 mmol/L (ref 22–32)
Calcium: 7.9 mg/dL — ABNORMAL LOW (ref 8.9–10.3)
Chloride: 103 mmol/L (ref 98–111)
Creatinine, Ser: 5.82 mg/dL — ABNORMAL HIGH (ref 0.61–1.24)
GFR, Estimated: 10 mL/min — ABNORMAL LOW (ref >60)
Glucose, Bld: 92 mg/dL (ref 70–99)
Phosphorus: 4.8 mg/dL — ABNORMAL HIGH (ref 2.5–4.6)
Potassium: 3.7 mmol/L (ref 3.5–5.1)
Sodium: 136 mmol/L (ref 135–145)

## 2021-04-26 MED ORDER — APIXABAN 5 MG PO TABS: 5.0000 mg | ORAL_TABLET | Freq: Two times a day (BID) | ORAL | 3 refills | Status: DC

## 2021-04-26 MED ORDER — MIDODRINE HCL 10 MG PO TABS: 10.0000 mg | ORAL_TABLET | Freq: Three times a day (TID) | ORAL | 3 refills | Status: DC

## 2021-04-26 MED ORDER — TRAMADOL HCL 50 MG PO TABS: 50.0000 mg | ORAL_TABLET | Freq: Four times a day (QID) | ORAL | 0 refills | Status: DC | PRN

## 2021-04-26 MED ORDER — INSULIN DETEMIR 100 UNIT/ML ~~LOC~~ SOLN: 8.0000 [IU] | Freq: Every day | SUBCUTANEOUS | 11 refills | Status: DC

## 2021-04-26 NOTE — Care Management Important Message (Signed)
Important Message ? ?Patient Details  ?Name: William Flores ?MRN: [id number] ?Date of Birth: 1950/11/27 ? ? ?Medicare Important Message Given:  Yes ? ? ? ? ?[name] ?04/26/2021, 12:18 PM ?

## 2021-04-26 NOTE — Telephone Encounter (Signed)
William Flores: ? ?Patient will be discharged today. He needs CBC repeated later this week (resides at [facility]). Previously has seen Dr. Jenetta Downer. Diagnosis: acute blood loss anemia.  ? ?Thanks! ? ?He already has a follow-up on 5/11.  ?

## 2021-04-26 NOTE — Discharge Instructions (Signed)
1) okay to restart Apixaban/Eliquis on Thursday, 04/29/2021 ?2) repeat CBC and BMP blood test on Thursday, 04/29/2021 ?3) please resume hemodialysis on Tuesday Thursday Saturday schedule ?4)Avoid ibuprofen/Advil/Aleve/Motrin/Goody Powders/Naproxen/BC powders/Meloxicam/Diclofenac/Indomethacin and other Nonsteroidal anti-inflammatory medications as these will make you more likely to bleed and can cause stomach ulcers, can also cause Kidney problems. ?

## 2021-04-26 NOTE — Progress Notes (Signed)
Patient ID: William Flores, male   DOB: [birth year], 71 y.o.   MRN: [id number] ? ?[facility] ?Progress Note  ? ? ?Subjective:   ?Feeling better  ? ?Objective:   ?BP (!) 89/50   Pulse (!) 113   Temp 98.7 ?F (37.1 ?C) (Oral)   Resp (!) 29   Ht 5' 4"  (1.626 m)   Wt 113.3 kg   SpO2 95%   BMI 42.87 kg/m?  ? ?Intake/Output: ?No intake/output data recorded. ? ? Intake/Output this shift: ? No intake/output data recorded. ?Weight change: 1.5 kg ? ?Physical Exam: ?Gen:NAD ?CVS: IRR IRR and tachycardic ?Resp:CTA ?Abd: +BS, soft, NT/ND ?Ext: trace pretibial edema bilaterally, RUE AVF +T/B ? ?Labs: ?BMET ?Recent Labs  ?Lab 04/22/21 ?1419 04/23/21 ?0402 04/24/21 ?0425 04/26/21 ?9381  ?NA 137 138 136 136  ?K 3.3* 3.6 3.8 3.7  ?CL 104 104 103 103  ?CO2 27 26 25 25   ?GLUCOSE 114* 86 78 92  ?BUN 24* 30* 37* 31*  ?CREATININE 3.49* 4.32* 5.46* 5.82*  ?ALBUMIN 2.5*  --  2.4* 2.2*  ?CALCIUM 7.9* 8.0* 8.2* 7.9*  ?PHOS  --   --  5.1* 4.8*  ? ?CBC ?Recent Labs  ?Lab 04/22/21 ?1419 04/22/21 ?2138 04/23/21 ?0402 04/23/21 ?1932 04/24/21 ?0425 04/25/21 ?1114 04/26/21 ?0175  ?WBC 2.5*   < > 2.9*  --  3.2* 2.4* 2.8*  ?NEUTROABS 1.8  --   --   --   --   --  1.8  ?HGB 5.8*   < > 6.7* 7.6* 7.8* 7.2* 7.3*  ?HCT 20.7*   < > 23.6* 26.2* 27.5* 24.7* 25.4*  ?MCV 111.9*   < > 108.3*  --  105.4* 106.9* 105.4*  ?PLT 93*   < > 89*  --  86* 65* 68*  ? < > = values in this interval not displayed.  ? ? ?  ?Medications:   ? ? allopurinol  200 mg Oral Daily  ? Chlorhexidine Gluconate Cloth  6 each Topical Daily  ? Chlorhexidine Gluconate Cloth  6 each Topical Q0600  ? insulin aspart  0-9 Units Subcutaneous Q4H  ? levothyroxine  100 mcg Oral q morning  ? mouth rinse  15 mL Mouth Rinse BID  ? midodrine  10 mg Oral TID WC  ? mupirocin ointment  1 application. Nasal BID  ? pantoprazole  40 mg Intravenous Q12H  ? sildenafil  20 mg Oral TID  ? ?[facility] [place] ?TTS 4hr EDW 108kg ?400/500 2/2.5 T35 ?Heparin 0 Mircera 256mg q2weeks (last 4/12) ?Left at  113.8kg on 4/20 ? ?Assessment/ Plan:   ?GIB and symptomatic anemia - s/p blood transfusion of 2 units PRBC's.  Hgb trending down.  Colonoscopy on 04/25/21 revealed bleeding colonic Dieulafoy lesion, treated with APC.  Transfuse as needed. ?ESRD- had HD on 4/22 and only able to UF 1 liter due to hypotension.  Continue with TTS schedule. ?Anemia:  acute on chronic s/p blood transfusion.  Hgb trending down.  Transfuse prn. ?CKD-MBD: continue with home meds ?Nutrition: renal diet ?Hypertension:  actually with hypotension. ?Liver cirrhosis secondary to NASH  ?Thrombocytopenia - related to cirrhosis ?Hypoalbuminemia - related to cirrhosis ?Pulmonary HTN - on sildenafil ?DM type 2 - per primary ?Atrial fibrillation - rate controlled.  Eliquis on hold due to GI bleed.  ? ?JDonetta Potts MD ?CD[place]ering?04/26/2021, 9:13 AM  ? ?

## 2021-04-26 NOTE — Progress Notes (Signed)
Chronic hypotension . Current BP 78/35, map 48/ heart rate 82. Asked lab to draw am labs now. On call provider made aware .  ?

## 2021-04-26 NOTE — Telephone Encounter (Signed)
I spoke with William Flores at [place] she is aware patient will need a cbc around 04/29/2021 or 04/30/2021 and will fax the results to [place] once they are back.  ?

## 2021-04-26 NOTE — Progress Notes (Addendum)
? ?  Gastroenterology Progress Note  ? ?Referring Provider: No ref. provider found ?Primary Care Physician:  [name], [name], MD ?Primary Gastroenterologist:  Dr. Jenetta Downer ? ?Patient ID: William Flores; [id number]; 03-May-1950  ? ? ?Subjective  ? ?No abdominal pain. 2 soft BMs this morning. No overt GI bleeding. Denies abdominal distension. States abdomen feels a baseline. No shortness of breath. No N/V.  ? ? ?Objective  ? ?Vital signs in last 24 hours ?Temp:  [97.3 ?F (36.3 ?C)-99.4 ?F (37.4 ?C)] 98.7 ?F (37.1 ?C) (04/24 0700) ?Pulse Rate:  [77-113] 113 (04/24 0600) ?Resp:  [17-29] 29 (04/24 0600) ?BP: (74-182)/(39-158) 89/50 (04/24 0600) ?SpO2:  [94 %-100 %] 95 % (04/24 0600) ?Weight:  [113.3 kg] 113.3 kg (04/24 0500) ?Last BM Date : 04/25/21 ? ?Physical Exam ?General:   Alert and oriented, pleasant ?Head:  Normocephalic and atraumatic. ?Abdomen:  Bowel sounds present, obese, large AP diameter but soft. Moderate-sized umbilical hernia ?Extremities:  With chronic venous stasis changes ?Neurologic:  Alert and  oriented x4 ? ?Intake/Output from previous day: ?No intake/output data recorded. ?Intake/Output this shift: ?No intake/output data recorded. ? ?Lab Results ? ?Recent Labs  ?  04/24/21 ?0425 04/25/21 ?1114 04/26/21 ?6440  ?WBC 3.2* 2.4* 2.8*  ?HGB 7.8* 7.2* 7.3*  ?HCT 27.5* 24.7* 25.4*  ?PLT 86* 65* 68*  ? ?BMET ?Recent Labs  ?  04/24/21 ?0425 04/26/21 ?3474  ?NA 136 136  ?K 3.8 3.7  ?CL 103 103  ?CO2 25 25  ?GLUCOSE 78 92  ?BUN 37* 31*  ?CREATININE 5.46* 5.82*  ?CALCIUM 8.2* 7.9*  ? ?LFT ?Recent Labs  ?  04/24/21 ?0425 04/26/21 ?2595  ?ALBUMIN 2.4* 2.2*  ? ?PT/INR ?Recent Labs  ?  04/24/21 ?0425  ?LABPROT 17.1*  ?INR 1.4*  ? ?Hepatitis Panel ?Recent Labs  ?  04/24/21 ?0425  ?HEPBSAG NON REACTIVE  ? ? ?Assessment  ?71 y.o. male with a history of ESRD on hemodialysis, distant history of colon cancer, NASH cirrhosis, prior GI bleeding in the past, CAD, CHF, on Eliquis with history of afib, presenting with acute  blood loss anemia and rectal bleeding.  ? ?Acute blood loss anemia: admitting Hgb 5.8. Received 2 units PRBCs this admission. Hgb remaining in mid to low 7 range for several days now. Colonoscopy revealed colonic Dieulafoy lesion bleeding s/p APC. Three small sessile polyps were not removed. No further overt GI bleeding.  ? ?Discussed with Dr. Joesph Fillers: patient has been stable and appropriate for discharge back to [facility]. May resume anticoagulation on 4/26. Will need to have repeat CBC later this week to ensure stable. Recommend close outpatient follow-up to discuss any further evaluation (previously had recommended Givens capsule as outpatient due to [place]).  ? ? ? ?Plan / Recommendations  ?Discharge today ?Repeat CBC in several days as outpatient ?NASH cirrhosis outpatient follow-up ?Resume Eliquis on 4/26 ?Outpatient follow-up with [facility] (he is already on the books for 5/11).  ? ? ? ? ? LOS: 4 days  ? ? 04/26/2021, 9:07 AM ? ?Annitta Needs, PhD, ANP-BC ?[facility]  ? ? ? ?

## 2021-04-26 NOTE — TOC Transition Note (Signed)
Transition of Care (TOC) - CM/SW Discharge Note ? ? ?Patient Details  ?Name: William Flores ?MRN: [id number] ?Date of Birth: 1950-06-26 ? ?Transition of Care (TOC) CM/SW Contact:  ?Shade Flood, LCSW ?Phone Number: ?04/26/2021, 11:18 AM ? ? ?Clinical Narrative:    ? ?Pt stable for dc back to [facility] today per MD. Updated Lenna Sciara at [facility]. Updated Melissa at [facility]. DC clinical sent electronically. RN to call report. EMS arranged. ? ?HIPPA compliant VMM left for pt's daughter informing of dc. ? ?There are no other TOC needs for dc. ? ?Final next level of care: [place] ?Barriers to Discharge: Barriers Resolved ? ? ?Patient Goals and CMS Choice ?Patient states their goals for this hospitalization and ongoing recovery are:: get better ?  ?  ? ?Discharge Placement ?  ?           ?  ?  ?  ?  ? ?Discharge Plan and Services ?In-house Referral: Clinical Social Work ?  ?Post Acute Care Choice: Resumption of Svcs/PTA Provider          ?  ?  ?  ?  ?  ?  ?  ?  ?  ?  ? ?Social Determinants of Health (SDOH) Interventions ?  ? ? ?Readmission Risk Interventions ? ?  04/23/2021  ?  1:14 PM 11/23/2020  ? 10:58 AM 10/24/2020  ? 12:41 PM  ?Readmission Risk Prevention Plan  ?Transportation Screening Complete Complete Complete  ?Medication Review Press photographer) Complete Complete Complete  ?PCP or Specialist appointment within 3-5 days of discharge  Complete   ?[place] or Home Care Consult Complete Complete Complete  ?SW Recovery Care/Counseling Consult Complete Complete Complete  ?Palliative Care Screening Not Applicable  Not Applicable  ?Skilled Nursing Facility Complete Complete Complete  ? ? ? ? ? ?

## 2021-04-26 NOTE — Progress Notes (Addendum)
?  Hypoglycemic Event ? ?CBG: 69 ? ?Treatment: 4 oz juice/soda ? ?Symptoms: None ? ?Follow-up CBG: Time: 0745 CBG Result: 78 ? ?Possible Reasons for Event: Unknown ? ?Comments/MD notified: Dr Joesph Fillers ? ? ? ?Raj Janus ? ? ?

## 2021-04-26 NOTE — Progress Notes (Signed)
Per on call provider continue to monitor for now ?

## 2021-04-26 NOTE — Progress Notes (Signed)
Nsg Discharge Note ? ?Admit Date:  04/22/2021 ?Discharge date: 04/26/2021 ?  ?William Flores to be D/C'd Skilled nursing facility per MD order.  AVS completed.  Copy for chart, and copy for patient signed, and dated. ?Patient/caregiver able to verbalize understanding. ? ?Discharge Medication: ?Allergies as of 04/26/2021   ? ?   Reactions  ? Codeine   ? Headache  ? ?  ? ?  ?Medication List  ?  ? ?STOP taking these medications   ? ?oxyCODONE 5 MG immediate release tablet ?Commonly known as: Oxy IR/ROXICODONE ?  ?Zinc Oxide 12.8 % ointment ?Commonly known as: TRIPLE PASTE ?  ? ?  ? ?TAKE these medications   ? ?acetaminophen 160 MG/5ML liquid ?Commonly known as: TYLENOL ?Take 650 mg by mouth every 4 (four) hours as needed for fever. ?  ?allopurinol 100 MG tablet ?Commonly known as: ZYLOPRIM ?Take 200 mg by mouth daily. ?  ?apixaban 5 MG Tabs tablet ?Commonly known as: ELIQUIS ?Take 1 tablet (5 mg total) by mouth 2 (two) times daily. Restart on 04/29/21 ?Start taking on: April 29, 2021 ?What changed:  ?additional instructions ?These instructions start on April 29, 2021. If you are unsure what to do until then, ask your doctor or other care provider. ?  ?atorvastatin 20 MG tablet ?Commonly known as: LIPITOR ?TAKE 1 TABLET BY MOUTH EVERY DAY ?What changed:  ?how much to take ?how to take this ?when to take this ?additional instructions ?  ?B-complex with vitamin C tablet ?Take 1 tablet by mouth daily. ?  ?Contour Next Test test strip ?Generic drug: glucose blood ?Test BS TID Dx E11.29 ?  ?Enulose 10 GM/15ML Soln ?Generic drug: lactulose (encephalopathy) ?Take 20 g by mouth daily. ?  ?ferrous sulfate 325 (65 FE) MG tablet ?Take 325 mg by mouth daily with breakfast. ?  ?guaiFENesin 100 MG/5ML liquid ?Commonly known as: ROBITUSSIN ?Take 5 mLs by mouth every 4 (four) hours as needed for cough or to loosen phlegm. ?  ?insulin aspart 100 UNIT/ML injection ?Commonly known as: novoLOG ?Before each meal 3 times a day, 140-199 - 2  units, 200-250 - 4 units, 251-299 - 6 units,  300-349 - 8 units,  350 or above 10 units. ?Insulin PEN if approved, provide syringes and needles if needed. ?  ?insulin detemir 100 UNIT/ML injection ?Commonly known as: LEVEMIR ?Inject 0.08 mLs (8 Units total) into the skin at bedtime. ?Start taking on: April 27, 2021 ?What changed: how much to take ?  ?Insulin Pen Needle 32G X 4 MM Misc ?Use to give insulin daily Dx E11.29 ?  ?levothyroxine 100 MCG tablet ?Commonly known as: SYNTHROID ?Take 100 mcg by mouth every morning. ?  ?melatonin 5 MG Tabs ?Take 5 mg by mouth at bedtime as needed (insomnia). ?  ?metoprolol succinate 25 MG 24 hr tablet ?Commonly known as: TOPROL-XL ?Take 25 mg by mouth daily. ?  ?midodrine 10 MG tablet ?Commonly known as: PROAMATINE ?Take 1 tablet (10 mg total) by mouth 3 (three) times daily with meals. ?  ?NovaSource Renal Liqd ?Take 240 mLs by mouth at bedtime. ?  ?nystatin cream ?Commonly known as: MYCOSTATIN ?Apply topically. ?  ?OXYGEN ?Inhale 2 L into the lungs continuous. ?  ?pantoprazole 40 MG tablet ?Commonly known as: PROTONIX ?TAKE 1 TABLET BY MOUTH EVERY DAY ?  ?polyethylene glycol 17 g packet ?Commonly known as: MIRALAX / GLYCOLAX ?Take 17 g by mouth daily. ?  ?potassium chloride SA 20 MEQ tablet ?Commonly known as: KLOR-CON M ?  Take 20 mEq by mouth daily. ?  ?Pro-Stat Liqd ?Take 30 mLs by mouth daily. ?  ?sildenafil 20 MG tablet ?Commonly known as: REVATIO ?Take 1 tablet (20 mg total) by mouth 3 (three) times daily. ?  ?traMADol 50 MG tablet ?Commonly known as: Ultram ?Take 1 tablet (50 mg total) by mouth every 6 (six) hours as needed. ?  ?Vitamin D 125 MCG (5000 UT) Caps ?Take 5,000 Units by mouth daily. ?  ? ?  ? ? ?Discharge Assessment: ?Vitals:  ? 04/26/21 0700 04/26/21 1200  ?BP:  110/68  ?Pulse:    ?Resp:  16  ?Temp: 98.7 ?F (37.1 ?C)   ?SpO2:    ? Skin clean, dry and intact without evidence of skin break down, no evidence of skin tears noted. ?IV catheter discontinued intact.  Site without signs and symptoms of complications - no redness or edema noted at insertion site, patient denies c/o pain - only slight tenderness at site.  Dressing with slight pressure applied. ? ?D/c Instructions-Education: ?Discharge instructions given to patient/family with verbalized understanding. ?D/c education completed with patient/family including follow up instructions, medication list, d/c activities limitations if indicated, with other d/c instructions as indicated by MD - patient able to verbalize understanding, all questions fully answered. ?Patient instructed to return to ED, call 911, or call MD for any changes in condition.  ?Patient escorted via EMS and transported to [facility]. ? ?Carney Corners, RN ?04/26/2021 1:07 PM  ?

## 2021-04-26 NOTE — Consult Note (Signed)
[facility] CM Inpatient Consult ? ? ?04/26/2021 ? ?[name] ?May 04, 1950 ?[id number] ? ?[place] Management [facility] CM) ?  ?Patient chart has been reviewed with noted high risk score for unplanned readmissions.  Patient assessed for community [place] Management follow up needs. Per review, current recommendation is for SNF. No [facility] CM needs at this time. ?  ?Of note, [facility] Care Management services does not replace or interfere with any services that are arranged by inpatient case management or social work.  ?  ?Netta Cedars, MSN, RN ?[facility] Liaison ?Phone [phone] ?Toll free office [phone]   ?

## 2021-04-26 NOTE — Discharge Summary (Signed)
?                                                                                ? ? ?William Flores, is a 71 y.o. male  DOB 1950/02/29  MRN [id number]. ? ?Admission date:  04/22/2021  Admitting Physician  Bethena Roys, MD ? ?Discharge Date:  04/26/2021  ? ?Primary MD  [name], [name], MD ? ?Recommendations for primary care physician for things to follow:  ? ?1) okay to restart Apixaban/Eliquis on Thursday, 04/29/2021 ?2) repeat CBC and BMP blood test on Thursday, 04/29/2021 ?3) please resume hemodialysis on Tuesday Thursday Saturday schedule ?4)Avoid ibuprofen/Advil/Aleve/Motrin/Goody Powders/Naproxen/BC powders/Meloxicam/Diclofenac/Indomethacin and other Nonsteroidal anti-inflammatory medications as these will make you more likely to bleed and can cause stomach ulcers, can also cause Kidney problems. ? ?Admission Diagnosis  Acute GI bleeding [K92.2] ? ? ?Discharge Diagnosis  Acute GI bleeding [K92.2]   ? ?Principal Problem: ?  Acute GI bleeding ?Active Problems: ?  OSA (obstructive sleep apnea) ?  Anticoagulated on Coumadin ?  Atrial fibrillation ([place]) ?  DM (diabetes mellitus), type 2 with renal complications ([place]) ?  Chronic respiratory failure with hypoxia (HCC) ?  Essential hypertension ?  Acute on chronic anemia ?  Pulmonary hypertension ([place]) ?  Liver cirrhosis secondary to NASH [facility]) ?  ESRD (end stage renal disease) on dialysis [facility]) ?    ? ?Past Medical History:  ?Diagnosis Date  ? Anemia   ? CAD (coronary artery disease)   ? LHC 2/08:  mRCA 50%, o/w no sig CAD, EF 25%  ? Cardiomyopathy ([place])   ? EF 35-40% in 2/16 in setting of AF with RVR >> echo 5/16 with improved LVF with EF 65-70%  ? CHF (congestive heart failure) ([place])   ? Chronic atrial fibrillation (HCC)   ? Chronic diastolic heart failure ([place])   ? HFPF - EF ~65-70% (OFTEN EXACERBATED BY AFIB)  ? Cirrhosis of liver ([place])   ? CKD (chronic kidney disease)   ? hx of worsening renal failure and hyperK+ in setting of acute diast CHF >> required  CVVHD in 4/16 and dialysis x 1 in 5/16  ? CKD stage 3 due to type 2 diabetes mellitus ([place]) 09/17/2015  ? Colon cancer ([place])   ? Colon polyps 09/21/2012  ? Tubular adenoma  ? Diabetes ([place])   ? GERD (gastroesophageal reflux disease)   ? Hyperlipidemia   ? Hypertension   ? Hypothyroidism   ? Insomnia   ? Nonalcoholic steatohepatitis   ? Obesity hypoventilation syndrome ([place])   ? Pulmonary hypertension ([place])   ? Renal insufficiency   ? Sleep apnea   ? Thrombocytopenia ([place])   ? ? ?Past Surgical History:  ?Procedure Laterality Date  ? AV FISTULA PLACEMENT Right 03/02/2021  ? Procedure: RIGHT ARM ARTERIOVENOUS (AV) FISTULA CREATION;  Surgeon: [name], MD;  [facility];  Service: Vascular;  Laterality: Right;  ? CIRCUMCISION    ? COLON RESECTION    ? COLONOSCOPY N/A 09/21/2012  ? Procedure: COLONOSCOPY;  Surgeon: Inda Castle, MD;  [facility] END[facility];  Service: Endoscopy;  Laterality: N/A;  ? COLONOSCOPY N/A 12/28/2018  ? Procedure: COLONOSCOPY;  Surgeon:  Rogene Houston, MD;  [facility];  Service: Endoscopy;  Laterality: N/A;  ? COLONOSCOPY WITH PROPOFOL N/A 10/17/2020  ? Procedure: COLONOSCOPY WITH PROPOFOL;  Surgeon: Harvel Quale, MD;  [facility];  Service: Gastroenterology;  Laterality: N/A;  ? ENTEROSCOPY N/A 06/24/2020  ? Procedure: ENTEROSCOPY;  Surgeon: Gatha Mayer, MD;  Location: [place];  Service: Endoscopy;  Laterality: N/A;  ? ESOPHAGOGASTRODUODENOSCOPY N/A 12/28/2018  ? Procedure: ESOPHAGOGASTRODUODENOSCOPY (EGD);  Surgeon: Rogene Houston, MD;  [facility];  Service: Endoscopy;  Laterality: N/A;  ? IR FLUORO GUIDE CV LINE RIGHT  12/10/2020  ? IR US GUIDE VASC ACCESS RIGHT  12/11/2020  ? POLYPECTOMY  10/17/2020  ? Procedure: POLYPECTOMY;  Surgeon: Montez Morita, Quillian Quince, MD;  [facility];  Service: Gastroenterology;;  rectal, transverse, descending, ascending, sigmoid  ? RIGHT HEART CATH N/A 04/11/2019  ? Procedure: RIGHT  HEART CATH;  Surgeon: Larey Dresser, MD;  Location: [address] CV LAB;  Service: Cardiovascular;  Laterality: N/A;  ? US ECHOCARDIOGRAPHY  03/04/2010  ? abnormal study  ? ? ? ? ? HPI  from the history and physical done on the day of admission:  ? ?  ?Chief Complaint: Low hemoglobin  ?  ?HPI: William Flores is a 71 y.o. male with medical history significant for multiple significant medical problems including ESRD, chronic respiratory failure on 2 L, [place] liver cirrhosis, obesity with obesity hypoventilation syndrome and OSA, diabetes mellitus, atrial fibrillation, congestive heart failure. ?Patient was sent to the ED from hemodialysis center with reports of low hemoglobin of 6.5..  Patient completed his session of dialysis today.  Reports over the past 4 to 5 days he has had bleeding from his rectum.  Patient initially tells me the blood from his rectum was only seen when he wiped, later reports there was blood in the toilet.  He denies vomiting.  He is unaware of having any black stools.  Reports fatigue.  He has chronic unchanged difficulty breathing, no chest pain. ?Reports an episode of blood in his urine which resolved, started a few weeks after he was started on dialysis. ?Patient history not quite reliable, he gives conflicting history.  He states he is on warfarin, medication list has Eliquis.  He reported to triage nurse that he did not have dialysis today but he tells me he completed HD today.  Unable to quantify how much blood he is having with bowel movements. ?  ?ED Course: Temperature 98.1.  Heart rate 90s to 100.  Respiratory rate 15-24.  Blood pressure systolic ranging from 16-073.  93.  WBC 2.5.  O2 sats 99 on 2 L. ?WBC 2.5.  Hemoglobin 5.8.  INR 1.9.  Platelets 93.  WBC 2.5. ?Patient reported he is also on warfarin to EDP, so he was given vitamin K 10 mg, Kcentra. ?EDP talked with Dr. Gala Romney, patient to be seen in the morning, recommended Protonix. ?  ?Review of Systems: As per HPI all other  systems reviewed and negative. ? ? ? ? Hospital Course:  ? ?Brief Narrative:  ? 71 y.o. male with medical history significant for multiple significant medical problems including ESRD, chronic respiratory failure on 2 L, [place] liver cirrhosis, obesity with obesity hypoventilation syndrome and OSA, diabetes mellitus, atrial fibrillation, congestive heart failure admitted on 04/22/2021 with concerns for acute GI bleed ? ? ?Assessment and Plan: ?1) acute on chronic anemia/presumed acute GI bleed --- due to acute GI bleed/acute blood loss  superimposed on anemia of ESRD in the setting of liver cirrhosis with pancytopenia ?-Received a total of 2 units of PRBC this admission ?---GI consult appreciated ?-Colonoscopy on 04/25/2021 :-Blood in the rectum, in the sigmoid colon and in the descending colon. ?                          - A single bleeding colonic Dieulafoy lesion.  ?                          Treated with argon plasma coagulation (APC). ?                          - The examined portion of the ileum was normal. ?                          - Three small polyps in the transverse colon. these  ?                          polyps were not removed. ?                          - External hemorrhoids. ?- ?Hgb currently 7.3 ?-Patient having brown stools, no further bleeding noted ?-Repeat CBC on Thursday, 04/29/2021 ?  ?2)ESRD (end stage renal disease) on dialysis [facility]) ?Schedule Tuesday Thursday Saturday.   ?Tolerated hemodialysis on 04/24/2021 ?-Nephrology consult appreciated ?  ?Liver cirrhosis secondary to NASH [facility]) ?-Baseline pancytopenia ?Okay to resume lactulose   ?-Follow platelets and INR ?  ?Pulmonary hypertension ([place]) ?-Continue sildenafil ?  ?DM (diabetes mellitus), type 2 with renal complications (HCC) ?-  ?-Blood sugars have been borderline low here due to poor oral intake in the setting of GI bleed ?-Insulin reduced to avoid hypoglycemia ?  ?Atrial fibrillation ([place]) ?Rate controlled  ?- Currently in atrial  fibrillation.  ?-Restart Eliquis on Thursday, 04/29/2021 ?  ?OSA (obstructive sleep apnea) ?OSA, OHS, morbid obesity, with chronic respiratory failure on 2 L. ?-CPAP nightly ?-Supplemental O2 ?  ?Disposition--dischar

## 2021-04-27 DIAGNOSIS — N250 Renal osteodystrophy: Secondary | ICD-10-CM | POA: Diagnosis not present

## 2021-04-27 DIAGNOSIS — R770 Abnormality of albumin: Secondary | ICD-10-CM | POA: Diagnosis not present

## 2021-04-27 DIAGNOSIS — K625 Hemorrhage of anus and rectum: Secondary | ICD-10-CM | POA: Diagnosis not present

## 2021-04-27 DIAGNOSIS — D62 Acute posthemorrhagic anemia: Secondary | ICD-10-CM | POA: Diagnosis not present

## 2021-04-27 DIAGNOSIS — D631 Anemia in chronic kidney disease: Secondary | ICD-10-CM | POA: Diagnosis not present

## 2021-04-27 DIAGNOSIS — Z992 Dependence on renal dialysis: Secondary | ICD-10-CM | POA: Diagnosis not present

## 2021-04-27 DIAGNOSIS — K644 Residual hemorrhoidal skin tags: Secondary | ICD-10-CM | POA: Diagnosis not present

## 2021-04-27 DIAGNOSIS — N186 End stage renal disease: Secondary | ICD-10-CM | POA: Diagnosis not present

## 2021-04-27 DIAGNOSIS — D509 Iron deficiency anemia, unspecified: Secondary | ICD-10-CM | POA: Diagnosis not present

## 2021-04-28 ENCOUNTER — Encounter (HOSPITAL_COMMUNITY): Payer: Self-pay | Admitting: Internal Medicine

## 2021-04-28 DIAGNOSIS — N186 End stage renal disease: Secondary | ICD-10-CM | POA: Diagnosis not present

## 2021-04-28 DIAGNOSIS — Z992 Dependence on renal dialysis: Secondary | ICD-10-CM | POA: Diagnosis not present

## 2021-04-28 DIAGNOSIS — N1832 Chronic kidney disease, stage 3b: Secondary | ICD-10-CM | POA: Diagnosis not present

## 2021-04-28 DIAGNOSIS — K625 Hemorrhage of anus and rectum: Secondary | ICD-10-CM | POA: Diagnosis not present

## 2021-04-29 DIAGNOSIS — N250 Renal osteodystrophy: Secondary | ICD-10-CM | POA: Diagnosis not present

## 2021-04-29 DIAGNOSIS — D631 Anemia in chronic kidney disease: Secondary | ICD-10-CM | POA: Diagnosis not present

## 2021-04-29 DIAGNOSIS — D509 Iron deficiency anemia, unspecified: Secondary | ICD-10-CM | POA: Diagnosis not present

## 2021-04-29 DIAGNOSIS — N186 End stage renal disease: Secondary | ICD-10-CM | POA: Diagnosis not present

## 2021-04-29 DIAGNOSIS — Z992 Dependence on renal dialysis: Secondary | ICD-10-CM | POA: Diagnosis not present

## 2021-04-29 NOTE — Chronic Care Management (AMB) (Signed)
?  Care Management  ? ?Outreach Note ? ?04/29/2021 ?Name: William Flores MRN: [id number] DOB: October 28, 1950 ? ?Referred by: [name], [name], MD ?Reason for referral : Care Coordination (Outreach to schedule initial with RN CM from HR list ) ? ? ?Third unsuccessful telephone outreach was attempted today. The patient was referred to the case management team for assistance with care management and care coordination. The patient's primary care provider has been notified of our unsuccessful attempts to make or maintain contact with the patient. The care management team is pleased to engage with this patient at any time in the future should he/she be interested in assistance from the care management team.  ? ?Follow Up Plan:  ?We have been unable to make contact with the patient for follow up. The care management team is available to follow up with the patient after provider conversation with the patient regarding recommendation for care management engagement and subsequent re-referral to the care management team.  ? ?Noreene Larsson, RMA ?Care Guide, Embedded Care Coordination ?[facility]  Care Management  ?[place], [place] [id number] ?Direct Dial: [phone] ?[email or web address] ?[email or web address]: [email or web address]  ? ?

## 2021-04-30 ENCOUNTER — Ambulatory Visit: Payer: Medicare Other | Admitting: Urology

## 2021-04-30 NOTE — Progress Notes (Deleted)
Assessment: 1. Gross hematuria   2. History of UTI   3. ESRD (end stage renal disease) on dialysis [facility])     Plan: His episodes of hematuria appear to be related to a UTI.   Please obtain a urinalysis and fax these results to the number above. Continue nystatin cream to penis twice daily x 7 days for balanitis Return to office in 6 weeks  Chief Complaint:  No chief complaint on file.   History of Present Illness:  William Flores is a 72 y.o. year old male who is seen for further evaluation of gross hematuria.  The patient has multiple medical problems and is currently on hemodialysis for end-stage renal disease.  He had an episode of gross hematuria in January 2023.  This was associated with dysuria.  No flank or abdominal pain.  He was told he had a UTI and treated with antibiotics.  The gross hematuria subsequently resolved.  He was not having any dysuria or other urinary symptoms at his visit on 02/02/21.  He voids approximately twice per day while on hemodialysis.  No history of UTIs or kidney stones.    He does have a remote history of tobacco use but has not smoked for over 30 years. CT imaging from 10/22 showed no renal or ureteral calculi and no evidence of obstruction. IPSS = 4. He was found to have balanitis on physical examination.  He was treated with nystatin cream.  At his visit in March 2023, he reported no further episodes of gross hematuria.  He reported that a urine sample was obtained at his nursing facility with some evidence of a UTI and he was treated with some antibiotics.  He reported some occasional dysuria.  His balanitis had improved.  Urine culture results: 02/09/2021 E. Coli  -treated with Macrobid x7 days 01/06/2021 E. coli, Proteus, Enterococcus -treated with fosfomycin  Portions of the above documentation were copied from a prior visit for review purposes only.   Past Medical History:  Past Medical History:  Diagnosis Date   Anemia    CAD (coronary  artery disease)    LHC 2/08:  mRCA 50%, o/w no sig CAD, EF 25%   Cardiomyopathy ([place])    EF 35-40% in 2/16 in setting of AF with RVR >> echo 5/16 with improved LVF with EF 65-70%   CHF (congestive heart failure) (HCC)    Chronic atrial fibrillation (HCC)    Chronic diastolic heart failure (HCC)    HFPF - EF ~65-70% (OFTEN EXACERBATED BY AFIB)   Cirrhosis of liver (HCC)    CKD (chronic kidney disease)    hx of worsening renal failure and hyperK+ in setting of acute diast CHF >> required CVVHD in 4/16 and dialysis x 1 in 5/16   CKD stage 3 due to type 2 diabetes mellitus ([place]) 09/17/2015   Colon cancer ([place])    Colon polyps 09/21/2012   Tubular adenoma   Diabetes (HCC)    GERD (gastroesophageal reflux disease)    Hyperlipidemia    Hypertension    Hypothyroidism    Insomnia    Nonalcoholic steatohepatitis    Obesity hypoventilation syndrome ([place])    Pulmonary hypertension ([place])    Renal insufficiency    Sleep apnea    Thrombocytopenia ([place])     Past Surgical History:  Past Surgical History:  Procedure Laterality Date   AV FISTULA PLACEMENT Right 03/02/2021   Procedure: RIGHT ARM ARTERIOVENOUS (AV) FISTULA CREATION;  Surgeon: [name], MD;  [facility];  Service: Vascular;  Laterality: Right;   CIRCUMCISION     COLON RESECTION     COLONOSCOPY N/A 09/21/2012   Procedure: COLONOSCOPY;  Surgeon: Inda Castle, MD;  [facility];  Service: Endoscopy;  Laterality: N/A;   COLONOSCOPY N/A 12/28/2018   Procedure: COLONOSCOPY;  Surgeon: Rogene Houston, MD;  [facility];  Service: Endoscopy;  Laterality: N/A;   COLONOSCOPY WITH PROPOFOL N/A 10/17/2020   Procedure: COLONOSCOPY WITH PROPOFOL;  Surgeon: Harvel Quale, MD;  [facility];  Service: Gastroenterology;  Laterality: N/A;   COLONOSCOPY WITH PROPOFOL N/A 04/25/2021   Procedure: COLONOSCOPY WITH PROPOFOL;  Surgeon: Rogene Houston, MD;  [facility];  Service:  Endoscopy;  Laterality: N/A;   ENTEROSCOPY N/A 06/24/2020   Procedure: ENTEROSCOPY;  Surgeon: Gatha Mayer, MD;  Location: [facility] ENDOSCOPY;  Service: Endoscopy;  Laterality: N/A;   ESOPHAGOGASTRODUODENOSCOPY N/A 12/28/2018   Procedure: ESOPHAGOGASTRODUODENOSCOPY (EGD);  Surgeon: Rogene Houston, MD;  [facility];  Service: Endoscopy;  Laterality: N/A;   HOT HEMOSTASIS  04/25/2021   Procedure: HOT HEMOSTASIS (ARGON PLASMA COAGULATION/BICAP);  Surgeon: Rogene Houston, MD;  [facility];  Service: Endoscopy;;   IR FLUORO GUIDE CV LINE RIGHT  12/10/2020   IR US GUIDE VASC ACCESS RIGHT  12/11/2020   POLYPECTOMY  10/17/2020   Procedure: POLYPECTOMY;  Surgeon: Harvel Quale, MD;  [facility];  Service: Gastroenterology;;  rectal, transverse, descending, ascending, sigmoid   RIGHT HEART CATH N/A 04/11/2019   Procedure: RIGHT HEART CATH;  Surgeon: Larey Dresser, MD;  Location: [place] CV LAB;  Service: Cardiovascular;  Laterality: N/A;   US ECHOCARDIOGRAPHY  03/04/2010   abnormal study    Allergies:  Allergies  Allergen Reactions   Codeine     Headache     Family History:  Family History  Problem Relation Age of Onset   Breast cancer Mother    Diabetes Mother    Heart attack Father     Social History:  Social History   Tobacco Use   Smoking status: Former    Types: Cigarettes    Quit date: 08/06/1983    Years since quitting: 37.7   Smokeless tobacco: Never  Vaping Use   Vaping Use: Never used  Substance Use Topics   Alcohol use: No   Drug use: No    ROS: Constitutional:  Negative for fever, chills, weight loss CV: Negative for chest pain, previous MI, hypertension Respiratory:  Negative for shortness of breath, wheezing, sleep apnea, frequent cough GI:  Negative for nausea, vomiting, bloody stool, GERD  Physical exam: There were no vitals taken for this visit. GENERAL APPEARANCE:  Well appearing, well developed, well  nourished, NAD HEENT:  Atraumatic, normocephalic, oropharynx clear NECK:  Supple without lymphadenopathy or thyromegaly ABDOMEN:  Soft, non-tender, no masses EXTREMITIES:  Moves all extremities well, without clubbing, cyanosis, or edema NEUROLOGIC:  Alert and oriented x 3, normal gait, CN II-XII grossly intact MENTAL STATUS:  appropriate BACK:  Non-tender to palpation, No CVAT SKIN:  Warm, dry, and intact  Results: U/A

## 2021-05-01 DIAGNOSIS — D509 Iron deficiency anemia, unspecified: Secondary | ICD-10-CM | POA: Diagnosis not present

## 2021-05-01 DIAGNOSIS — D631 Anemia in chronic kidney disease: Secondary | ICD-10-CM | POA: Diagnosis not present

## 2021-05-01 DIAGNOSIS — Z992 Dependence on renal dialysis: Secondary | ICD-10-CM | POA: Diagnosis not present

## 2021-05-01 DIAGNOSIS — N186 End stage renal disease: Secondary | ICD-10-CM | POA: Diagnosis not present

## 2021-05-01 DIAGNOSIS — N250 Renal osteodystrophy: Secondary | ICD-10-CM | POA: Diagnosis not present

## 2021-05-02 DIAGNOSIS — Z992 Dependence on renal dialysis: Secondary | ICD-10-CM | POA: Diagnosis not present

## 2021-05-02 DIAGNOSIS — N186 End stage renal disease: Secondary | ICD-10-CM | POA: Diagnosis not present

## 2021-05-03 DIAGNOSIS — Z79899 Other long term (current) drug therapy: Secondary | ICD-10-CM | POA: Diagnosis not present

## 2021-05-03 DIAGNOSIS — R52 Pain, unspecified: Secondary | ICD-10-CM | POA: Diagnosis not present

## 2021-05-03 HISTORY — PX: CARDIAC ELECTROPHYSIOLOGY MAPPING AND ABLATION: SHX1292

## 2021-05-03 NOTE — Telephone Encounter (Signed)
Tried calling [place] facility no answer.   ?

## 2021-05-04 ENCOUNTER — Inpatient Hospital Stay (HOSPITAL_COMMUNITY): Payer: Medicare Other

## 2021-05-04 ENCOUNTER — Emergency Department (HOSPITAL_COMMUNITY): Payer: Medicare Other

## 2021-05-04 ENCOUNTER — Encounter (HOSPITAL_COMMUNITY): Payer: Self-pay | Admitting: Emergency Medicine

## 2021-05-04 ENCOUNTER — Other Ambulatory Visit: Payer: Self-pay

## 2021-05-04 ENCOUNTER — Inpatient Hospital Stay (HOSPITAL_COMMUNITY)
Admission: EM | Admit: 2021-05-04 | Discharge: 2021-05-16 | DRG: 871 | Disposition: A | Discharge status: Skilled Nursing Facility | Payer: Medicare Other | Source: Skilled Nursing Facility | Attending: Internal Medicine | Admitting: Internal Medicine

## 2021-05-04 DIAGNOSIS — E8889 Other specified metabolic disorders: Secondary | ICD-10-CM | POA: Diagnosis present

## 2021-05-04 DIAGNOSIS — R188 Other ascites: Secondary | ICD-10-CM | POA: Diagnosis not present

## 2021-05-04 DIAGNOSIS — D509 Iron deficiency anemia, unspecified: Secondary | ICD-10-CM | POA: Diagnosis not present

## 2021-05-04 DIAGNOSIS — J9622 Acute and chronic respiratory failure with hypercapnia: Secondary | ICD-10-CM | POA: Diagnosis present

## 2021-05-04 DIAGNOSIS — K429 Umbilical hernia without obstruction or gangrene: Secondary | ICD-10-CM | POA: Diagnosis present

## 2021-05-04 DIAGNOSIS — R231 Pallor: Secondary | ICD-10-CM | POA: Diagnosis present

## 2021-05-04 DIAGNOSIS — Z20822 Contact with and (suspected) exposure to covid-19: Secondary | ICD-10-CM | POA: Diagnosis present

## 2021-05-04 DIAGNOSIS — Z8719 Personal history of other diseases of the digestive system: Secondary | ICD-10-CM

## 2021-05-04 DIAGNOSIS — K7200 Acute and subacute hepatic failure without coma: Secondary | ICD-10-CM | POA: Diagnosis present

## 2021-05-04 DIAGNOSIS — I9589 Other hypotension: Secondary | ICD-10-CM | POA: Diagnosis present

## 2021-05-04 DIAGNOSIS — K7581 Nonalcoholic steatohepatitis (NASH): Secondary | ICD-10-CM | POA: Diagnosis not present

## 2021-05-04 DIAGNOSIS — J189 Pneumonia, unspecified organism: Secondary | ICD-10-CM | POA: Diagnosis present

## 2021-05-04 DIAGNOSIS — K5900 Constipation, unspecified: Secondary | ICD-10-CM

## 2021-05-04 DIAGNOSIS — Z66 Do not resuscitate: Secondary | ICD-10-CM | POA: Diagnosis not present

## 2021-05-04 DIAGNOSIS — R112 Nausea with vomiting, unspecified: Secondary | ICD-10-CM

## 2021-05-04 DIAGNOSIS — R1010 Upper abdominal pain, unspecified: Secondary | ICD-10-CM

## 2021-05-04 DIAGNOSIS — Z87891 Personal history of nicotine dependence: Secondary | ICD-10-CM

## 2021-05-04 DIAGNOSIS — Z85038 Personal history of other malignant neoplasm of large intestine: Secondary | ICD-10-CM | POA: Diagnosis not present

## 2021-05-04 DIAGNOSIS — J9602 Acute respiratory failure with hypercapnia: Secondary | ICD-10-CM | POA: Diagnosis not present

## 2021-05-04 DIAGNOSIS — R110 Nausea: Secondary | ICD-10-CM | POA: Diagnosis not present

## 2021-05-04 DIAGNOSIS — I959 Hypotension, unspecified: Principal | ICD-10-CM

## 2021-05-04 DIAGNOSIS — Z6841 Body Mass Index (BMI) 40.0 and over, adult: Secondary | ICD-10-CM

## 2021-05-04 DIAGNOSIS — R0609 Other forms of dyspnea: Secondary | ICD-10-CM | POA: Diagnosis not present

## 2021-05-04 DIAGNOSIS — K922 Gastrointestinal hemorrhage, unspecified: Secondary | ICD-10-CM | POA: Diagnosis not present

## 2021-05-04 DIAGNOSIS — J9611 Chronic respiratory failure with hypoxia: Secondary | ICD-10-CM | POA: Diagnosis present

## 2021-05-04 DIAGNOSIS — E782 Mixed hyperlipidemia: Secondary | ICD-10-CM | POA: Diagnosis present

## 2021-05-04 DIAGNOSIS — Z79890 Hormone replacement therapy (postmenopausal): Secondary | ICD-10-CM

## 2021-05-04 DIAGNOSIS — R1111 Vomiting without nausea: Secondary | ICD-10-CM | POA: Diagnosis not present

## 2021-05-04 DIAGNOSIS — R778 Other specified abnormalities of plasma proteins: Secondary | ICD-10-CM | POA: Diagnosis not present

## 2021-05-04 DIAGNOSIS — J9601 Acute respiratory failure with hypoxia: Secondary | ICD-10-CM | POA: Diagnosis not present

## 2021-05-04 DIAGNOSIS — I4891 Unspecified atrial fibrillation: Secondary | ICD-10-CM | POA: Diagnosis not present

## 2021-05-04 DIAGNOSIS — I248 Other forms of acute ischemic heart disease: Secondary | ICD-10-CM | POA: Diagnosis present

## 2021-05-04 DIAGNOSIS — K7460 Unspecified cirrhosis of liver: Secondary | ICD-10-CM | POA: Diagnosis not present

## 2021-05-04 DIAGNOSIS — R160 Hepatomegaly, not elsewhere classified: Secondary | ICD-10-CM | POA: Diagnosis present

## 2021-05-04 DIAGNOSIS — Z9981 Dependence on supplemental oxygen: Secondary | ICD-10-CM

## 2021-05-04 DIAGNOSIS — Z8249 Family history of ischemic heart disease and other diseases of the circulatory system: Secondary | ICD-10-CM

## 2021-05-04 DIAGNOSIS — R6521 Severe sepsis with septic shock: Secondary | ICD-10-CM | POA: Diagnosis not present

## 2021-05-04 DIAGNOSIS — E1122 Type 2 diabetes mellitus with diabetic chronic kidney disease: Secondary | ICD-10-CM | POA: Diagnosis present

## 2021-05-04 DIAGNOSIS — Z91158 Patient's noncompliance with renal dialysis for other reason: Secondary | ICD-10-CM

## 2021-05-04 DIAGNOSIS — E039 Hypothyroidism, unspecified: Secondary | ICD-10-CM | POA: Diagnosis present

## 2021-05-04 DIAGNOSIS — K219 Gastro-esophageal reflux disease without esophagitis: Secondary | ICD-10-CM | POA: Diagnosis not present

## 2021-05-04 DIAGNOSIS — E876 Hypokalemia: Secondary | ICD-10-CM | POA: Diagnosis present

## 2021-05-04 DIAGNOSIS — D6959 Other secondary thrombocytopenia: Secondary | ICD-10-CM | POA: Diagnosis not present

## 2021-05-04 DIAGNOSIS — A419 Sepsis, unspecified organism: Secondary | ICD-10-CM | POA: Diagnosis present

## 2021-05-04 DIAGNOSIS — I2721 Secondary pulmonary arterial hypertension: Secondary | ICD-10-CM | POA: Diagnosis present

## 2021-05-04 DIAGNOSIS — Z794 Long term (current) use of insulin: Secondary | ICD-10-CM

## 2021-05-04 DIAGNOSIS — J9621 Acute and chronic respiratory failure with hypoxia: Secondary | ICD-10-CM | POA: Diagnosis present

## 2021-05-04 DIAGNOSIS — I50810 Right heart failure, unspecified: Secondary | ICD-10-CM | POA: Diagnosis not present

## 2021-05-04 DIAGNOSIS — D62 Acute posthemorrhagic anemia: Secondary | ICD-10-CM | POA: Diagnosis not present

## 2021-05-04 DIAGNOSIS — I482 Chronic atrial fibrillation, unspecified: Secondary | ICD-10-CM | POA: Diagnosis present

## 2021-05-04 DIAGNOSIS — I2510 Atherosclerotic heart disease of native coronary artery without angina pectoris: Secondary | ICD-10-CM | POA: Diagnosis not present

## 2021-05-04 DIAGNOSIS — J449 Chronic obstructive pulmonary disease, unspecified: Secondary | ICD-10-CM | POA: Diagnosis not present

## 2021-05-04 DIAGNOSIS — N390 Urinary tract infection, site not specified: Secondary | ICD-10-CM | POA: Diagnosis not present

## 2021-05-04 DIAGNOSIS — J90 Pleural effusion, not elsewhere classified: Secondary | ICD-10-CM | POA: Diagnosis not present

## 2021-05-04 DIAGNOSIS — R531 Weakness: Secondary | ICD-10-CM | POA: Diagnosis not present

## 2021-05-04 DIAGNOSIS — I272 Pulmonary hypertension, unspecified: Secondary | ICD-10-CM | POA: Diagnosis not present

## 2021-05-04 DIAGNOSIS — R570 Cardiogenic shock: Secondary | ICD-10-CM | POA: Diagnosis present

## 2021-05-04 DIAGNOSIS — G4733 Obstructive sleep apnea (adult) (pediatric): Secondary | ICD-10-CM | POA: Diagnosis present

## 2021-05-04 DIAGNOSIS — E662 Morbid (severe) obesity with alveolar hypoventilation: Secondary | ICD-10-CM | POA: Diagnosis present

## 2021-05-04 DIAGNOSIS — K920 Hematemesis: Secondary | ICD-10-CM | POA: Diagnosis not present

## 2021-05-04 DIAGNOSIS — G9341 Metabolic encephalopathy: Secondary | ICD-10-CM | POA: Diagnosis present

## 2021-05-04 DIAGNOSIS — R0602 Shortness of breath: Secondary | ICD-10-CM | POA: Diagnosis not present

## 2021-05-04 DIAGNOSIS — Z515 Encounter for palliative care: Secondary | ICD-10-CM | POA: Diagnosis not present

## 2021-05-04 DIAGNOSIS — R54 Age-related physical debility: Secondary | ICD-10-CM | POA: Diagnosis present

## 2021-05-04 DIAGNOSIS — N186 End stage renal disease: Secondary | ICD-10-CM | POA: Diagnosis present

## 2021-05-04 DIAGNOSIS — I50812 Chronic right heart failure: Secondary | ICD-10-CM

## 2021-05-04 DIAGNOSIS — I5082 Biventricular heart failure: Secondary | ICD-10-CM | POA: Diagnosis present

## 2021-05-04 DIAGNOSIS — Z833 Family history of diabetes mellitus: Secondary | ICD-10-CM

## 2021-05-04 DIAGNOSIS — I10 Essential (primary) hypertension: Secondary | ICD-10-CM | POA: Diagnosis not present

## 2021-05-04 DIAGNOSIS — E8809 Other disorders of plasma-protein metabolism, not elsewhere classified: Secondary | ICD-10-CM | POA: Diagnosis present

## 2021-05-04 DIAGNOSIS — Z992 Dependence on renal dialysis: Secondary | ICD-10-CM | POA: Diagnosis not present

## 2021-05-04 DIAGNOSIS — R600 Localized edema: Secondary | ICD-10-CM | POA: Diagnosis not present

## 2021-05-04 DIAGNOSIS — R339 Retention of urine, unspecified: Secondary | ICD-10-CM | POA: Diagnosis not present

## 2021-05-04 DIAGNOSIS — I132 Hypertensive heart and chronic kidney disease with heart failure and with stage 5 chronic kidney disease, or end stage renal disease: Secondary | ICD-10-CM | POA: Diagnosis not present

## 2021-05-04 DIAGNOSIS — K2970 Gastritis, unspecified, without bleeding: Secondary | ICD-10-CM | POA: Diagnosis present

## 2021-05-04 DIAGNOSIS — I428 Other cardiomyopathies: Secondary | ICD-10-CM | POA: Diagnosis present

## 2021-05-04 DIAGNOSIS — Z886 Allergy status to analgesic agent status: Secondary | ICD-10-CM

## 2021-05-04 DIAGNOSIS — N200 Calculus of kidney: Secondary | ICD-10-CM | POA: Diagnosis not present

## 2021-05-04 DIAGNOSIS — I5032 Chronic diastolic (congestive) heart failure: Secondary | ICD-10-CM | POA: Diagnosis present

## 2021-05-04 DIAGNOSIS — K5909 Other constipation: Secondary | ICD-10-CM | POA: Diagnosis present

## 2021-05-04 DIAGNOSIS — E1121 Type 2 diabetes mellitus with diabetic nephropathy: Secondary | ICD-10-CM | POA: Diagnosis not present

## 2021-05-04 DIAGNOSIS — I2781 Cor pulmonale (chronic): Secondary | ICD-10-CM | POA: Diagnosis present

## 2021-05-04 DIAGNOSIS — N3289 Other specified disorders of bladder: Secondary | ICD-10-CM | POA: Diagnosis not present

## 2021-05-04 DIAGNOSIS — I255 Ischemic cardiomyopathy: Secondary | ICD-10-CM | POA: Diagnosis present

## 2021-05-04 DIAGNOSIS — D631 Anemia in chronic kidney disease: Secondary | ICD-10-CM | POA: Diagnosis not present

## 2021-05-04 DIAGNOSIS — Z86010 Personal history of colonic polyps: Secondary | ICD-10-CM

## 2021-05-04 DIAGNOSIS — Z7189 Other specified counseling: Secondary | ICD-10-CM | POA: Diagnosis not present

## 2021-05-04 DIAGNOSIS — G4700 Insomnia, unspecified: Secondary | ICD-10-CM | POA: Diagnosis present

## 2021-05-04 DIAGNOSIS — M109 Gout, unspecified: Secondary | ICD-10-CM | POA: Diagnosis present

## 2021-05-04 DIAGNOSIS — Z79899 Other long term (current) drug therapy: Secondary | ICD-10-CM

## 2021-05-04 DIAGNOSIS — R627 Adult failure to thrive: Secondary | ICD-10-CM | POA: Diagnosis present

## 2021-05-04 DIAGNOSIS — R109 Unspecified abdominal pain: Secondary | ICD-10-CM | POA: Diagnosis not present

## 2021-05-04 DIAGNOSIS — N2581 Secondary hyperparathyroidism of renal origin: Secondary | ICD-10-CM | POA: Diagnosis present

## 2021-05-04 DIAGNOSIS — E1129 Type 2 diabetes mellitus with other diabetic kidney complication: Secondary | ICD-10-CM | POA: Diagnosis present

## 2021-05-04 DIAGNOSIS — N189 Chronic kidney disease, unspecified: Secondary | ICD-10-CM | POA: Diagnosis present

## 2021-05-04 DIAGNOSIS — Z7901 Long term (current) use of anticoagulants: Secondary | ICD-10-CM

## 2021-05-04 DIAGNOSIS — Z885 Allergy status to narcotic agent status: Secondary | ICD-10-CM

## 2021-05-04 LAB — DIFFERENTIAL
Abs Immature Granulocytes: 0.12 10*3/uL — ABNORMAL HIGH (ref 0.00–0.07)
Basophils Absolute: 0.1 10*3/uL (ref 0.0–0.1)
Basophils Relative: 2 %
Eosinophils Absolute: 0.1 10*3/uL (ref 0.0–0.5)
Eosinophils Relative: 2 %
Immature Granulocytes: 2 %
Lymphocytes Relative: 12 %
Lymphs Abs: 0.7 10*3/uL (ref 0.7–4.0)
Monocytes Absolute: 0.6 10*3/uL (ref 0.1–1.0)
Monocytes Relative: 10 %
Neutro Abs: 4.2 10*3/uL (ref 1.7–7.7)
Neutrophils Relative %: 72 %

## 2021-05-04 LAB — RESP PANEL BY RT-PCR (FLU A&B, COVID) ARPGX2
Influenza A by PCR: NEGATIVE
Influenza B by PCR: NEGATIVE
SARS Coronavirus 2 by RT PCR: NEGATIVE

## 2021-05-04 LAB — LIPASE, BLOOD: Lipase: 19 U/L (ref 11–51)

## 2021-05-04 LAB — ECHOCARDIOGRAM COMPLETE
AR max vel: 1.36 cm2
AV Area VTI: 1.6 cm2
AV Area mean vel: 1.28 cm2
AV Mean grad: 5.4 mmHg
AV Peak grad: 11.3 mmHg
Ao pk vel: 1.68 m/s
Area-P 1/2: 3.01 cm2
Height: 64 in
S' Lateral: 2.4 cm
Weight: 3996.5 oz

## 2021-05-04 LAB — I-STAT CHEM 8, ED
BUN: 36 mg/dL — ABNORMAL HIGH (ref 8–23)
Calcium, Ion: 1.05 mmol/L — ABNORMAL LOW (ref 1.15–1.40)
Chloride: 100 mmol/L (ref 98–111)
Creatinine, Ser: 7.7 mg/dL — ABNORMAL HIGH (ref 0.61–1.24)
Glucose, Bld: 68 mg/dL — ABNORMAL LOW (ref 70–99)
HCT: 41 % (ref 39.0–52.0)
Hemoglobin: 13.9 g/dL (ref 13.0–17.0)
Potassium: 4.2 mmol/L (ref 3.5–5.1)
Sodium: 136 mmol/L (ref 135–145)
TCO2: 27 mmol/L (ref 22–32)

## 2021-05-04 LAB — COMPREHENSIVE METABOLIC PANEL
ALT: 296 U/L — ABNORMAL HIGH (ref 0–44)
AST: 528 U/L — ABNORMAL HIGH (ref 15–41)
Albumin: 3 g/dL — ABNORMAL LOW (ref 3.5–5.0)
Alkaline Phosphatase: 91 U/L (ref 38–126)
Anion gap: 9 (ref 5–15)
BUN: 40 mg/dL — ABNORMAL HIGH (ref 8–23)
CO2: 25 mmol/L (ref 22–32)
Calcium: 8.9 mg/dL (ref 8.9–10.3)
Chloride: 102 mmol/L (ref 98–111)
Creatinine, Ser: 6.83 mg/dL — ABNORMAL HIGH (ref 0.61–1.24)
GFR, Estimated: 8 mL/min — ABNORMAL LOW (ref >60)
Glucose, Bld: 72 mg/dL (ref 70–99)
Potassium: 4.3 mmol/L (ref 3.5–5.1)
Sodium: 136 mmol/L (ref 135–145)
Total Bilirubin: 0.9 mg/dL (ref 0.3–1.2)
Total Protein: 7.3 g/dL (ref 6.5–8.1)

## 2021-05-04 LAB — PROTIME-INR
INR: 2.1 — ABNORMAL HIGH (ref 0.8–1.2)
Prothrombin Time: 23.1 seconds — ABNORMAL HIGH (ref 11.4–15.2)

## 2021-05-04 LAB — CBC
HCT: 38.4 % — ABNORMAL LOW (ref 39.0–52.0)
Hemoglobin: 11.2 g/dL — ABNORMAL LOW (ref 13.0–17.0)
MCH: 32.1 pg (ref 26.0–34.0)
MCHC: 29.2 g/dL — ABNORMAL LOW (ref 30.0–36.0)
MCV: 110 fL — ABNORMAL HIGH (ref 80.0–100.0)
Platelets: 127 10*3/uL — ABNORMAL LOW (ref 150–400)
RBC: 3.49 MIL/uL — ABNORMAL LOW (ref 4.22–5.81)
RDW: 25.9 % — ABNORMAL HIGH (ref 11.5–15.5)
WBC: 5.8 10*3/uL (ref 4.0–10.5)
nRBC: 1.9 % — ABNORMAL HIGH (ref 0.0–0.2)

## 2021-05-04 LAB — GLUCOSE, CAPILLARY
Glucose-Capillary: 72 mg/dL (ref 70–99)
Glucose-Capillary: 65 mg/dL — ABNORMAL LOW (ref 70–99)
Glucose-Capillary: 80 mg/dL (ref 70–99)

## 2021-05-04 LAB — HEMOGLOBIN A1C
Hgb A1c MFr Bld: 4.3 % — ABNORMAL LOW (ref 4.8–5.6)
Mean Plasma Glucose: 76.71 mg/dL

## 2021-05-04 LAB — HEMOGLOBIN AND HEMATOCRIT, BLOOD
HCT: 31.7 % — ABNORMAL LOW (ref 39.0–52.0)
Hemoglobin: 8.7 g/dL — ABNORMAL LOW (ref 13.0–17.0)

## 2021-05-04 LAB — TROPONIN I (HIGH SENSITIVITY)
Troponin I (High Sensitivity): 417 ng/L (ref <18)
Troponin I (High Sensitivity): 413 ng/L (ref <18)

## 2021-05-04 LAB — APTT: aPTT: 51 seconds — ABNORMAL HIGH (ref 24–36)

## 2021-05-04 LAB — POC OCCULT BLOOD, ED: Fecal Occult Bld: NEGATIVE

## 2021-05-04 LAB — PROCALCITONIN: Procalcitonin: 0.53 ng/mL

## 2021-05-04 LAB — BRAIN NATRIURETIC PEPTIDE: B Natriuretic Peptide: 1371 pg/mL — ABNORMAL HIGH (ref 0.0–100.0)

## 2021-05-04 LAB — LACTIC ACID, PLASMA: Lactic Acid, Venous: 1.7 mmol/L (ref 0.5–1.9)

## 2021-05-04 MED ORDER — SODIUM CHLORIDE 0.9 % IV SOLN: 250.0000 mL | INTRAVENOUS | Status: DC | PRN

## 2021-05-04 MED ORDER — ALBUMIN HUMAN 25 % IV SOLN
50.0000 g | Freq: Once | INTRAVENOUS | Status: AC
  Administered 2021-05-04: 50 g via INTRAVENOUS
  Filled 2021-05-04 (×2): qty 200

## 2021-05-04 MED ORDER — PERFLUTREN LIPID MICROSPHERE
1.0000 mL | INTRAVENOUS | Status: AC | PRN
  Administered 2021-05-04: 1 mL via INTRAVENOUS

## 2021-05-04 MED ORDER — PANTOPRAZOLE SODIUM 40 MG IV SOLR
40.0000 mg | Freq: Once | INTRAVENOUS | Status: AC
  Administered 2021-05-04: 40 mg via INTRAVENOUS
  Filled 2021-05-04: qty 10

## 2021-05-04 MED ORDER — SODIUM CHLORIDE 0.9 % IV SOLN: INTRAVENOUS | Status: DC

## 2021-05-04 MED ORDER — SODIUM CHLORIDE 0.9 % IV SOLN
1.0000 g | INTRAVENOUS | Status: DC
  Administered 2021-05-05 – 2021-05-09 (×5): 1 g via INTRAVENOUS
  Filled 2021-05-04 (×6): qty 10

## 2021-05-04 MED ORDER — PANTOPRAZOLE SODIUM 40 MG PO TBEC
40.0000 mg | DELAYED_RELEASE_TABLET | Freq: Every day | ORAL | Status: DC
  Filled 2021-05-04: qty 1

## 2021-05-04 MED ORDER — DEXTROSE 50 % IV SOLN
INTRAVENOUS | Status: AC
  Filled 2021-05-04: qty 50

## 2021-05-04 MED ORDER — ACETAMINOPHEN 325 MG PO TABS: 650.0000 mg | ORAL_TABLET | Freq: Four times a day (QID) | ORAL | Status: DC | PRN

## 2021-05-04 MED ORDER — METRONIDAZOLE 500 MG/100ML IV SOLN
500.0000 mg | Freq: Two times a day (BID) | INTRAVENOUS | Status: DC
  Administered 2021-05-05 – 2021-05-06 (×4): 500 mg via INTRAVENOUS
  Filled 2021-05-04 (×5): qty 100

## 2021-05-04 MED ORDER — ONDANSETRON HCL 4 MG PO TABS: 4.0000 mg | ORAL_TABLET | Freq: Four times a day (QID) | ORAL | Status: DC | PRN

## 2021-05-04 MED ORDER — SILDENAFIL CITRATE 20 MG PO TABS
20.0000 mg | ORAL_TABLET | Freq: Three times a day (TID) | ORAL | Status: DC
  Administered 2021-05-04 – 2021-05-05 (×2): 20 mg via ORAL
  Filled 2021-05-04 (×3): qty 1

## 2021-05-04 MED ORDER — HEPARIN BOLUS VIA INFUSION
4000.0000 [IU] | Freq: Once | INTRAVENOUS | Status: AC
  Administered 2021-05-04: 4000 [IU] via INTRAVENOUS
  Filled 2021-05-04: qty 4000

## 2021-05-04 MED ORDER — TRAZODONE HCL 50 MG PO TABS
25.0000 mg | ORAL_TABLET | Freq: Every evening | ORAL | Status: DC | PRN
  Administered 2021-05-04 – 2021-05-16 (×4): 25 mg via ORAL
  Filled 2021-05-04 (×4): qty 1

## 2021-05-04 MED ORDER — SODIUM CHLORIDE 0.9% FLUSH
3.0000 mL | Freq: Two times a day (BID) | INTRAVENOUS | Status: DC
  Administered 2021-05-04 – 2021-05-16 (×26): 3 mL via INTRAVENOUS

## 2021-05-04 MED ORDER — LEVALBUTEROL HCL 0.63 MG/3ML IN NEBU: 0.6300 mg | INHALATION_SOLUTION | Freq: Four times a day (QID) | RESPIRATORY_TRACT | Status: DC | PRN

## 2021-05-04 MED ORDER — INSULIN ASPART 100 UNIT/ML IJ SOLN
0.0000 [IU] | Freq: Three times a day (TID) | INTRAMUSCULAR | Status: DC
  Administered 2021-05-08 – 2021-05-11 (×7): 1 [IU] via SUBCUTANEOUS
  Administered 2021-05-12: 2 [IU] via SUBCUTANEOUS
  Administered 2021-05-12 – 2021-05-13 (×3): 1 [IU] via SUBCUTANEOUS
  Administered 2021-05-13: 3 [IU] via SUBCUTANEOUS
  Administered 2021-05-14 (×3): 2 [IU] via SUBCUTANEOUS
  Administered 2021-05-15 – 2021-05-16 (×3): 1 [IU] via SUBCUTANEOUS
  Administered 2021-05-16: 2 [IU] via SUBCUTANEOUS
  Administered 2021-05-16: 1 [IU] via SUBCUTANEOUS

## 2021-05-04 MED ORDER — TRAMADOL HCL 50 MG PO TABS: 50.0000 mg | ORAL_TABLET | Freq: Four times a day (QID) | ORAL | Status: DC | PRN

## 2021-05-04 MED ORDER — PROCHLORPERAZINE EDISYLATE 10 MG/2ML IJ SOLN
10.0000 mg | Freq: Four times a day (QID) | INTRAMUSCULAR | Status: DC | PRN
  Administered 2021-05-04 – 2021-05-12 (×4): 10 mg via INTRAVENOUS
  Filled 2021-05-04 (×4): qty 2

## 2021-05-04 MED ORDER — SODIUM CHLORIDE 0.9 % IV SOLN
2.0000 g | Freq: Once | INTRAVENOUS | Status: AC
  Administered 2021-05-04: 2 g via INTRAVENOUS
  Filled 2021-05-04: qty 12.5

## 2021-05-04 MED ORDER — HYDROMORPHONE HCL 1 MG/ML IJ SOLN
0.5000 mg | INTRAMUSCULAR | Status: DC | PRN
  Administered 2021-05-05 – 2021-05-09 (×5): 1 mg via INTRAVENOUS
  Filled 2021-05-04 (×5): qty 1

## 2021-05-04 MED ORDER — ALLOPURINOL 100 MG PO TABS
200.0000 mg | ORAL_TABLET | Freq: Every day | ORAL | Status: DC
  Administered 2021-05-04 – 2021-05-16 (×11): 200 mg via ORAL
  Filled 2021-05-04 (×13): qty 2

## 2021-05-04 MED ORDER — INSULIN DETEMIR 100 UNIT/ML ~~LOC~~ SOLN
8.0000 [IU] | Freq: Every day | SUBCUTANEOUS | Status: DC
  Filled 2021-05-04 (×2): qty 0.08

## 2021-05-04 MED ORDER — METOPROLOL SUCCINATE ER 25 MG PO TB24
25.0000 mg | ORAL_TABLET | Freq: Every day | ORAL | Status: DC
Start: 2021-05-04 — End: 2021-05-05
  Filled 2021-05-04: qty 1

## 2021-05-04 MED ORDER — METRONIDAZOLE 500 MG/100ML IV SOLN
500.0000 mg | Freq: Once | INTRAVENOUS | Status: AC
  Administered 2021-05-04: 500 mg via INTRAVENOUS
  Filled 2021-05-04: qty 100

## 2021-05-04 MED ORDER — ATORVASTATIN CALCIUM 10 MG PO TABS
20.0000 mg | ORAL_TABLET | Freq: Every day | ORAL | Status: DC
  Administered 2021-05-04 – 2021-05-16 (×13): 20 mg via ORAL
  Filled 2021-05-04 (×2): qty 1
  Filled 2021-05-04: qty 2
  Filled 2021-05-04 (×2): qty 1
  Filled 2021-05-04: qty 2
  Filled 2021-05-04 (×2): qty 1
  Filled 2021-05-04: qty 2
  Filled 2021-05-04: qty 1
  Filled 2021-05-04 (×2): qty 2
  Filled 2021-05-04: qty 1

## 2021-05-04 MED ORDER — OXYCODONE HCL 5 MG PO TABS
5.0000 mg | ORAL_TABLET | ORAL | Status: DC | PRN
  Administered 2021-05-05 – 2021-05-08 (×6): 5 mg via ORAL
  Filled 2021-05-04 (×6): qty 1

## 2021-05-04 MED ORDER — LACTULOSE 10 GM/15ML PO SOLN
20.0000 g | Freq: Every day | ORAL | Status: DC
  Administered 2021-05-04: 20 g via ORAL
  Filled 2021-05-04 (×3): qty 30

## 2021-05-04 MED ORDER — HEPARIN (PORCINE) 25000 UT/250ML-% IV SOLN
1350.0000 [IU]/h | INTRAVENOUS | Status: DC
  Administered 2021-05-04: 1350 [IU]/h via INTRAVENOUS
  Filled 2021-05-04: qty 250

## 2021-05-04 MED ORDER — MIDODRINE HCL 5 MG PO TABS
10.0000 mg | ORAL_TABLET | Freq: Three times a day (TID) | ORAL | Status: DC
  Administered 2021-05-04 – 2021-05-10 (×12): 10 mg via ORAL
  Filled 2021-05-04 (×13): qty 2

## 2021-05-04 MED ORDER — PANTOPRAZOLE SODIUM 40 MG IV SOLR
40.0000 mg | Freq: Two times a day (BID) | INTRAVENOUS | Status: DC
  Administered 2021-05-05 – 2021-05-16 (×24): 40 mg via INTRAVENOUS
  Filled 2021-05-04 (×24): qty 10

## 2021-05-04 MED ORDER — POLYETHYLENE GLYCOL 3350 17 G PO PACK
17.0000 g | PACK | Freq: Every day | ORAL | Status: DC
  Administered 2021-05-04: 17 g via ORAL
  Filled 2021-05-04: qty 1

## 2021-05-04 MED ORDER — SODIUM CHLORIDE 0.9% FLUSH: 3.0000 mL | INTRAVENOUS | Status: DC | PRN

## 2021-05-04 MED ORDER — ACETAMINOPHEN 650 MG RE SUPP
650.0000 mg | Freq: Four times a day (QID) | RECTAL | Status: DC | PRN
Start: 2021-05-04 — End: 2021-05-17

## 2021-05-04 MED ORDER — SODIUM CHLORIDE 0.9 % IV SOLN: 2.0000 g | INTRAVENOUS | Status: DC

## 2021-05-04 MED ORDER — ONDANSETRON HCL 4 MG/2ML IJ SOLN
4.0000 mg | Freq: Four times a day (QID) | INTRAMUSCULAR | Status: DC | PRN
  Administered 2021-05-04 – 2021-05-15 (×9): 4 mg via INTRAVENOUS
  Filled 2021-05-04 (×9): qty 2

## 2021-05-04 MED ORDER — APIXABAN 5 MG PO TABS: 5.0000 mg | ORAL_TABLET | Freq: Two times a day (BID) | ORAL | Status: DC

## 2021-05-04 MED ORDER — BISACODYL 5 MG PO TBEC: 5.0000 mg | DELAYED_RELEASE_TABLET | Freq: Every day | ORAL | Status: DC | PRN

## 2021-05-04 MED ORDER — LEVOTHYROXINE SODIUM 100 MCG PO TABS
100.0000 ug | ORAL_TABLET | Freq: Every morning | ORAL | Status: DC
  Administered 2021-05-07 – 2021-05-16 (×10): 100 ug via ORAL
  Filled 2021-05-04 (×10): qty 1

## 2021-05-04 MED ORDER — SODIUM CHLORIDE 0.9% IV SOLUTION: Freq: Once | INTRAVENOUS | Status: AC

## 2021-05-04 MED ORDER — VANCOMYCIN HCL 2000 MG/400ML IV SOLN
2000.0000 mg | Freq: Once | INTRAVENOUS | Status: AC
  Administered 2021-05-04: 2000 mg via INTRAVENOUS
  Filled 2021-05-04: qty 400

## 2021-05-04 MED ORDER — SENNOSIDES-DOCUSATE SODIUM 8.6-50 MG PO TABS: 1.0000 | ORAL_TABLET | Freq: Every evening | ORAL | Status: DC | PRN

## 2021-05-04 MED ORDER — HEPARIN SODIUM (PORCINE) 5000 UNIT/ML IJ SOLN: 5000.0000 [IU] | Freq: Three times a day (TID) | INTRAMUSCULAR | Status: DC

## 2021-05-04 MED ORDER — PROCHLORPERAZINE EDISYLATE 10 MG/2ML IJ SOLN: 10.0000 mg | Freq: Four times a day (QID) | INTRAMUSCULAR | Status: DC | PRN

## 2021-05-04 MED ORDER — SODIUM CHLORIDE 0.9% FLUSH: 3.0000 mL | Freq: Two times a day (BID) | INTRAVENOUS | Status: DC

## 2021-05-04 MED ORDER — VANCOMYCIN HCL IN DEXTROSE 1-5 GM/200ML-% IV SOLN: 1000.0000 mg | INTRAVENOUS | Status: DC

## 2021-05-04 MED ORDER — SODIUM CHLORIDE 0.9 % IV BOLUS
500.0000 mL | Freq: Once | INTRAVENOUS | Status: AC
  Administered 2021-05-04: 500 mL via INTRAVENOUS

## 2021-05-04 MED ORDER — LACTULOSE 10 GM/15ML PO SOLN
20.0000 g | Freq: Two times a day (BID) | ORAL | Status: DC
  Administered 2021-05-04 – 2021-05-07 (×4): 20 g via ORAL
  Filled 2021-05-04 (×6): qty 30

## 2021-05-04 MED ORDER — HYDRALAZINE HCL 20 MG/ML IJ SOLN: 10.0000 mg | INTRAMUSCULAR | Status: DC | PRN

## 2021-05-04 MED ORDER — ONDANSETRON HCL 4 MG/2ML IJ SOLN
4.0000 mg | Freq: Once | INTRAMUSCULAR | Status: AC
  Administered 2021-05-04: 4 mg via INTRAVENOUS
  Filled 2021-05-04: qty 2

## 2021-05-04 MED ORDER — SODIUM CHLORIDE 0.9 % IV BOLUS
1000.0000 mL | Freq: Once | INTRAVENOUS | Status: AC
Start: 2021-05-04 — End: 2021-05-04
  Administered 2021-05-04: 1000 mL via INTRAVENOUS

## 2021-05-04 MED ORDER — FERROUS SULFATE 325 (65 FE) MG PO TABS
325.0000 mg | ORAL_TABLET | Freq: Every day | ORAL | Status: DC
  Administered 2021-05-08 – 2021-05-16 (×9): 325 mg via ORAL
  Filled 2021-05-04 (×10): qty 1

## 2021-05-04 MED ORDER — IPRATROPIUM BROMIDE 0.02 % IN SOLN: 0.5000 mg | Freq: Four times a day (QID) | RESPIRATORY_TRACT | Status: DC | PRN

## 2021-05-04 MED ORDER — NYSTATIN 100000 UNIT/GM EX CREA
TOPICAL_CREAM | Freq: Two times a day (BID) | CUTANEOUS | Status: DC
  Administered 2021-05-09: 1 via TOPICAL
  Filled 2021-05-04 (×5): qty 15

## 2021-05-04 MED ORDER — DEXTROSE 50 % IV SOLN
12.5000 g | INTRAVENOUS | Status: AC
  Administered 2021-05-04: 12.5 g via INTRAVENOUS

## 2021-05-04 NOTE — Assessment & Plan Note (Signed)
Body mass index is 42.87 kg/m?. ?-Discussed weight management, weight loss, ?Diet and exercise ?

## 2021-05-04 NOTE — ED Notes (Signed)
Unsuccessful IV attempt x2.  Phlebotomy at bedside.  ?

## 2021-05-04 NOTE — ED Notes (Signed)
Pt requested O2 be turned back down to 2L which is his normal.  ?

## 2021-05-04 NOTE — ED Notes (Signed)
ECHO at bedside.

## 2021-05-04 NOTE — Assessment & Plan Note (Addendum)
-   Continue home O2 2Ls  ?-Maintain O2 sat greater 92% ?-As needed DuoNeb bronchodilators ?No signs of exacerbation ?

## 2021-05-04 NOTE — Assessment & Plan Note (Signed)
-   Monitoring closely, supplemental oxygen, CPAP nightly ?

## 2021-05-04 NOTE — Progress Notes (Addendum)
Pharmacy Antibiotic Note ? ?William Flores a 71 y.o. male admitted on 05/04/2021 with  intra abdominal infection .  Pharmacy has been consulted for cefepime and vancomycin dosing. ? ?Plan: ?Vancomycin 2gm x 1  ?Vancomycin 1gm to be given IV during the last hour of HD on tues thurs sat schedule. ?Cefepime 1gm IV every 24 hours, given after HD  ? ?Medical History: ?Past Medical History:  ?Diagnosis Date  ? Anemia   ? CAD (coronary artery disease)   ? LHC 2/08:  mRCA 50%, o/w no sig CAD, EF 25%  ? Cardiomyopathy ([place])   ? EF 35-40% in 2/16 in setting of AF with RVR >> echo 5/16 with improved LVF with EF 65-70%  ? CHF (congestive heart failure) ([place])   ? Chronic atrial fibrillation (HCC)   ? Chronic diastolic heart failure ([place])   ? HFPF - EF ~65-70% (OFTEN EXACERBATED BY AFIB)  ? Cirrhosis of liver ([place])   ? CKD (chronic kidney disease)   ? hx of worsening renal failure and hyperK+ in setting of acute diast CHF >> required CVVHD in 4/16 and dialysis x 1 in 5/16  ? CKD stage 3 due to type 2 diabetes mellitus ([place]) 09/17/2015  ? Colon cancer ([place])   ? Colon polyps 09/21/2012  ? Tubular adenoma  ? Diabetes ([place])   ? GERD (gastroesophageal reflux disease)   ? Hyperlipidemia   ? Hypertension   ? Hypothyroidism   ? Insomnia   ? Nonalcoholic steatohepatitis   ? Obesity hypoventilation syndrome ([place])   ? Pulmonary hypertension ([place])   ? Renal insufficiency   ? Sleep apnea   ? Thrombocytopenia ([place])   ? ? ?Allergies:  ?Allergies  ?Allergen Reactions  ? Codeine   ?  Headache ?  ? ? ?Filed Weights  ? 05/04/21 1144  ?Weight: 113.3 kg (249 lb 12.5 oz)  ? ? ? ?  Latest Ref Rng & Units 04/26/2021  ?  3:43 AM 04/25/2021  ? 11:14 AM 04/24/2021  ?  4:25 AM  ?CBC  ?WBC 4.0 - 10.5 K/uL 2.8   2.4   3.2    ?Hemoglobin 13.0 - 17.0 g/dL 7.3   7.2   7.8    ?Hematocrit 39.0 - 52.0 % 25.4   24.7   27.5    ?Platelets 150 - 400 K/uL 68   65   86    ? ? ? ?Estimated Creatinine Clearance: 13.7 mL/min (A) (by C-G formula based on SCr of 5.82 mg/dL  (H)). ? ?Antibiotics Given (last 72 hours)   ? ? None  ? ?  ? ? ?Antimicrobials this admission: ? ?Cefepime 05/04/2021  >>  ?Metronidazole 05/04/2021 >>  ?Vancomycin 05/04/2021>> ? ?Microbiology results: ?05/04/2021  BCx: sent ?05/04/2021  Resp Panel: sent  ? ?Thank you for allowing pharmacy to be a part of this patientWilliams care. ? ?Thomasenia Sales, PharmD ?Clinical Pharmacist ? ? ? ?

## 2021-05-04 NOTE — Assessment & Plan Note (Signed)
Continue PPI ?

## 2021-05-04 NOTE — Sepsis Progress Note (Signed)
Notified bedside nurse of need to draw lactic acid and Blood cultures.  ?

## 2021-05-04 NOTE — Assessment & Plan Note (Addendum)
-  Management as above ?- Currently stable, x-ray consistent with congestion, ?-Cardiology consulted, pending repeat echo ?-Last echo reviewed 11/22/2020 ?EJF: 55-60%, dilated LV, mild LVH, increase right ventricular pressure and volume overload, right ventricular systolic function reduced, right atrium severely dilated, Mild to moderate mitral valve regurgitation.  ?-Gentle IV fluid hydration now, in concerns for sepsis subsequently discontinued ?-Continue hemodialysis ?

## 2021-05-04 NOTE — ED Notes (Signed)
Pt has started c/o pain in leg and back.  Will make EDP aware.  ?

## 2021-05-04 NOTE — Assessment & Plan Note (Signed)
-  Continue statins ?

## 2021-05-04 NOTE — Assessment & Plan Note (Addendum)
-   Continue Eliquis, metoprolol ?-Rate controlled, ?

## 2021-05-04 NOTE — Consult Note (Signed)
?Cardiology Consultation:  ? ?Patient ID: William Flores ?MRN: [id number]; DOB: [birth year] ? ?Admit date: 05/04/2021 ?Date of Consult: 05/04/2021 ? ?PCP:  [name], [name], MD ?  ?[facility] Prov[facility] ?Cardiologist:  William Flores  ? ? ?Patient Profile:  ? ?William Flores is a 71 y.o. male with a hx of chronic RV failure, pulmonary HTN, OSA/OHS, ESRD, recent GI bleed who is being seen 05/04/2021 for the evaluation of hypotension at the request of Dr Roderic Palau. ? ?History of Present Illness:  ? ?William Flores 71 yo male history of chronic afib on coumadin, chronic diastolic heart failure, chronic RV failure with pulmonary HTN, ESRD, DM2, OHS/OSA, prior GI bleed 04/2021 presents with hypotension. Patient sent from nursing home with generalized fatigue and low bp's. Was not able to go to HD today due to low bp's. Some SOB, denies any chest pain. His primary complaint if feeling weak. ? ? ?K 4.3 Cr 6.83 BUN 40 AST 528 ALT 296 Tbili 0.9 Hgb 11.2 Plt 127 Lactic acid 1.7 INR 2.1 BNP (207 5 months ago) Procalc 0.53 ?Trop 417--> ?CXR patchy asymmetric opacities R>L, edema vs pneumonia. Vascular congestion, small bilateral effusions ?EKG afib, chronic nonspecific ST/T changes ? ?11/2020 echo: LVEF 55-60% no WMAs, indet diastolic function, Dseptum, severe RV dysfunction and enlargement, severe pulm HTN, mild to mod MR,  ?04/2019 RHC: mean PA 53, PCWP 23, CI 2.71 PVR 5 WU ? ? ? ?Past Medical History:  ?Diagnosis Date  ? Anemia   ? CAD (coronary artery disease)   ? LHC 2/08:  mRCA 50%, o/w no sig CAD, EF 25%  ? Cardiomyopathy ([place])   ? EF 35-40% in 2/16 in setting of AF with RVR >> echo 5/16 with improved LVF with EF 65-70%  ? CHF (congestive heart failure) ([place])   ? Chronic atrial fibrillation (HCC)   ? Chronic diastolic heart failure ([place])   ? HFPF - EF ~65-70% (OFTEN EXACERBATED BY AFIB)  ? Cirrhosis of liver ([place])   ? CKD (chronic kidney disease)   ? hx of worsening renal failure and hyperK+ in setting of acute diast CHF >>  required CVVHD in 4/16 and dialysis x 1 in 5/16  ? CKD stage 3 due to type 2 diabetes mellitus ([place]) 09/17/2015  ? Colon cancer ([place])   ? Colon polyps 09/21/2012  ? Tubular adenoma  ? Diabetes ([place])   ? GERD (gastroesophageal reflux disease)   ? Hyperlipidemia   ? Hypertension   ? Hypothyroidism   ? Insomnia   ? Nonalcoholic steatohepatitis   ? Obesity hypoventilation syndrome ([place])   ? Pulmonary hypertension ([place])   ? Renal insufficiency   ? Sleep apnea   ? Thrombocytopenia ([place])   ? ? ?Past Surgical History:  ?Procedure Laterality Date  ? AV FISTULA PLACEMENT Right 03/02/2021  ? Procedure: RIGHT ARM ARTERIOVENOUS (AV) FISTULA CREATION;  Surgeon: [name], MD;  Location: [facility];  Service: Vascular;  Laterality: Right;  ? CIRCUMCISION    ? COLON RESECTION    ? COLONOSCOPY N/A 09/21/2012  ? Procedure: COLONOSCOPY;  Surgeon: Inda Castle, MD;  Location: [facility];  Service: Endoscopy;  Laterality: N/A;  ? COLONOSCOPY N/A 12/28/2018  ? Procedure: COLONOSCOPY;  Surgeon: Rogene Houston, MD;  Location: [facility];  Service: Endoscopy;  Laterality: N/A;  ? COLONOSCOPY WITH PROPOFOL N/A 10/17/2020  ? Procedure: COLONOSCOPY WITH PROPOFOL;  Surgeon: Harvel Quale, MD;  Location: [facility];  Service: Gastroenterology;  Laterality: N/A;  ?  COLONOSCOPY WITH PROPOFOL N/A 04/25/2021  ? Procedure: COLONOSCOPY WITH PROPOFOL;  Surgeon: Rogene Houston, MD;  [facility];  Service: Endoscopy;  Laterality: N/A;  ? ENTEROSCOPY N/A 06/24/2020  ? Procedure: ENTEROSCOPY;  Surgeon: Gatha Mayer, MD;  Location: [facility];  Service: Endoscopy;  Laterality: N/A;  ? ESOPHAGOGASTRODUODENOSCOPY N/A 12/28/2018  ? Procedure: ESOPHAGOGASTRODUODENOSCOPY (EGD);  Surgeon: Rogene Houston, MD;  [facility];  Service: Endoscopy;  Laterality: N/A;  ? HOT HEMOSTASIS  04/25/2021  ? Procedure: HOT HEMOSTASIS (ARGON PLASMA COAGULATION/BICAP);  Surgeon: Rogene Houston, MD;  [facility];   Service: Endoscopy;;  ? IR FLUORO GUIDE CV LINE RIGHT  12/10/2020  ? IR US GUIDE VASC ACCESS RIGHT  12/11/2020  ? POLYPECTOMY  10/17/2020  ? Procedure: POLYPECTOMY;  Surgeon: Montez Morita, Quillian Quince, MD;  Location[facility];  Service: Gastroenterology;;  rectal, transverse, descending, ascending, sigmoid  ? RIGHT HEART CATH N/A 04/11/2019  ? Procedure: RIGHT HEART CATH;  Surgeon: Larey Dresser, MD;  Location: [facility];  Service: Cardiovascular;  Laterality: N/A;  ? US ECHOCARDIOGRAPHY  03/04/2010  ? abnormal study  ?  ? ?Inpatient Medications: ?Scheduled Meds: ? allopurinol  200 mg Oral Daily  ? apixaban  5 mg Oral BID  ? atorvastatin  20 mg Oral QHS  ? [START ON 05/05/2021] ferrous sulfate  325 mg Oral Q breakfast  ? insulin aspart  0-6 Units Subcutaneous TID WC  ? insulin detemir  8 Units Subcutaneous QHS  ? lactulose (encephalopathy)  20 g Oral Daily  ? [START ON 05/05/2021] levothyroxine  100 mcg Oral q morning  ? metoprolol succinate  25 mg Oral Daily  ? midodrine  10 mg Oral TID WC  ? nystatin cream   Topical BID  ? pantoprazole  40 mg Oral Daily  ? polyethylene glycol  17 g Oral Daily  ? sodium chloride flush  3 mL Intravenous Q12H  ? sodium chloride flush  3 mL Intravenous Q12H  ? ?Continuous Infusions: ? sodium chloride    ? sodium chloride    ? albumin human    ? [START ON 05/05/2021] ceFEPime (MAXIPIME) IV    ? metronidazole    ? [START ON 05/06/2021] vancomycin    ? vancomycin    ? ?PRN Meds: ?sodium chloride, acetaminophen **OR** acetaminophen, bisacodyl, hydrALAZINE, HYDROmorphone (DILAUDID) injection, ipratropium, levalbuterol, ondansetron **OR** ondansetron (ZOFRAN) IV, oxyCODONE, senna-docusate, sodium chloride flush, traMADol, traZODone ? ?Allergies:    ?Allergies  ?Allergen Reactions  ? Codeine   ?  Headache ?  ? ? ?Social History:   ?Social History  ? ?Socioeconomic History  ? Marital status: Divorced  ?  Spouse name: Not on file  ? Number of children: 1  ? Years of education: 3  ? Highest  education level: 12th grade  ?Occupational History  ?  Employer: [employer]  ?Tobacco Use  ? Smoking status: Former  ?  Types: Cigarettes  ?  Quit date: 08/06/1983  ?  Years since quitting: 37.7  ? Smokeless tobacco: Never  ?Vaping Use  ? Vaping Use: Never used  ?Substance and Sexual Activity  ? Alcohol use: No  ? Drug use: No  ? Sexual activity: Not Currently  ?Other Topics Concern  ? Not on file  ?Social History Narrative  ? Lives alone  ? ?Social Determinants of Health  ? ?Financial Resource Strain: Low Risk   ? Difficulty of Paying Living Expenses: Not very hard  ?Food  Insecurity: No Food Insecurity  ? Worried About Charity fundraiser in the Last Year: Never true  ? Ran Out of Food in the Last Year: Never true  ?Transportation Needs: No Transportation Needs  ? Lack of Transportation (Medical): No  ? Lack of Transportation (Non-Medical): No  ?Physical Activity: Not on file  ?Stress: Not on file  ?Social Connections: Not on file  ?Intimate Partner Violence: Not on file  ?  ?Family History:   ? ?Family History  ?Problem Relation Age of Onset  ? Breast cancer Mother   ? Diabetes Mother   ? Heart attack Father   ?  ? ?ROS:  ?Please see the history of present illness.  ? ?All other ROS reviewed and negative.    ? ?Physical Exam/Data:  ? ?Vitals:  ? 05/04/21 1350 05/04/21 1400 05/04/21 1415 05/04/21 1430  ?BP:  (!) 86/52 (!) 87/50 (!) 77/30  ?Pulse: 82 74 74 89  ?Resp: (!) 23 (!) 23 15 13   ?Temp:      ?TempSrc:      ?SpO2: 97% 94% 95% 93%  ?Weight:      ?Height:      ? ?No intake or output data in the 24 hours ending 05/04/21 1539 ? ?  05/04/2021  ? 11:44 AM 04/26/2021  ?  5:00 AM 04/25/2021  ?  5:00 AM  ?Last 3 Weights  ?Weight (lbs) 249 lb 12.5 oz 249 lb 12.5 oz 251 lb 12.3 oz  ?Weight (kg) 113.3 kg 113.3 kg 114.2 kg  ?   ?Body mass index is 42.87 kg/m?.  ?General:  Well nourished, well developed, in no acute distress ?HEENT: normal ?Neck: +JVD ?Vascular: No carotid bruits; Distal pulses 2+ bilaterally ?Cardiac:   irreg ?Lungs:  decreased breath sounds bases  ?Abd: soft, nontender, no hepatomegaly  ?Ext: no edema ?Musculoskeletal:  No deformities, BUE and BLE strength normal and equal ?Skin: warm and dry  ?Neuro:

## 2021-05-04 NOTE — ED Provider Notes (Signed)
?[facility] ?Provider Note ? ? ?CSN: [id number] ?Arrival date & time: 05/04/21  1133 ? ?  ? ?History ? ?Chief Complaint  ?Patient presents with  ? Abdominal Pain  ? ? ?William Flores is a 71 y.o. male. ? ?Patient has a history of renal failure and is on dialysis he also has cirrhosis.  Patient also has a history of heart failure.  He was brought to the emergency department today because he was nauseated and hypotension.  He was post to get dialysis today ? ?The history is provided by the patient and medical records. No language interpreter was used.  ?Weakness ?Severity:  Moderate ?Onset quality:  Sudden ?Timing:  Constant ?Progression:  Worsening ?Chronicity:  New ?Context: not alcohol use   ?Relieved by:  Nothing ?Worsened by:  Nothing ?Ineffective treatments:  None tried ?Associated symptoms: no abdominal pain, no chest pain, no cough, no diarrhea, no frequency, no headaches and no seizures   ?Risk factors: no anemia   ? ?  ? ?Home Medications ?Prior to Admission medications   ?Medication Sig Start Date End Date Taking? Authorizing Provider  ?acetaminophen (TYLENOL) 160 MG/5ML liquid Take 650 mg by mouth every 4 (four) hours as needed for fever.    [provider]  ?allopurinol (ZYLOPRIM) 100 MG tablet Take 200 mg by mouth daily.    [provider]  ?Amino Acids-Protein Hydrolys (PRO-STAT) LIQD Take 30 mLs by mouth daily.    [provider]  ?apixaban (ELIQUIS) 5 MG TABS tablet Take 1 tablet (5 mg total) by mouth 2 (two) times daily. Restart on 04/29/21 04/29/21   Roxan Hockey, MD  ?atorvastatin (LIPITOR) 20 MG tablet TAKE 1 TABLET BY MOUTH EVERY DAY ?Patient taking differently: Take 20 mg by mouth at bedtime. 03/19/20   [name], [name], MD  ?B Complex-C (B-COMPLEX WITH VITAMIN C) tablet Take 1 tablet by mouth daily. 12/12/20   Florencia Reasons, MD  ?Cholecalciferol (VITAMIN D) 125 MCG (5000 UT) CAPS Take 5,000 Units by mouth daily.    [provider]  ?ENULOSE  10 GM/15ML SOLN Take 20 g by mouth daily. 04/13/21   [provider]  ?ferrous sulfate 325 (65 FE) MG tablet Take 325 mg by mouth daily with breakfast.    [provider]  ?glucose blood (CONTOUR NEXT TEST) test strip Test BS TID Dx E11.29 05/22/20   [name], [name], MD  ?guaiFENesin (ROBITUSSIN) 100 MG/5ML liquid Take 5 mLs by mouth every 4 (four) hours as needed for cough or to loosen phlegm.    [provider]  ?insulin aspart (NOVOLOG) 100 UNIT/ML injection Before each meal 3 times a day, 140-199 - 2 units, 200-250 - 4 units, 251-299 - 6 units,  300-349 - 8 units,  350 or above 10 units. ?Insulin PEN if approved, provide syringes and needles if needed. 12/11/20   Florencia Reasons, MD  ?insulin detemir (LEVEMIR) 100 UNIT/ML injection Inject 0.08 mLs (8 Units total) into the skin at bedtime. 04/27/21   Roxan Hockey, MD  ?Insulin Pen Needle 32G X 4 MM MISC Use to give insulin daily Dx E11.29 04/15/19   Charlynne Cousins, MD  ?levothyroxine (SYNTHROID) 100 MCG tablet Take 100 mcg by mouth every morning.    [provider]  ?melatonin 5 MG TABS Take 5 mg by mouth at bedtime as needed (insomnia).    [provider]  ?metoprolol succinate (TOPROL-XL) 25 MG 24 hr tablet Take 25 mg by mouth daily. 12/25/20  [provider]  ?midodrine (PROAMATINE) 10 MG tablet Take 1 tablet (10 mg total) by mouth 3 (three) times daily with meals. 04/26/21   Roxan Hockey, MD  ?Nutritional Supplements (NOVASOURCE RENAL) LIQD Take 240 mLs by mouth at bedtime.    [provider]  ?nystatin cream (MYCOSTATIN) Apply topically. 03/05/21   [provider]  ?OXYGEN Inhale 2 L into the lungs continuous.    [provider]  ?pantoprazole (PROTONIX) 40 MG tablet TAKE 1 TABLET BY MOUTH EVERY DAY 06/02/20   [name], [name], MD  ?polyethylene glycol (MIRALAX / GLYCOLAX) 17 g packet Take 17 g by mouth daily. 01/01/19   Roxan Hockey, MD  ?potassium chloride SA  (KLOR-CON M) 20 MEQ tablet Take 20 mEq by mouth daily.    [provider]  ?sildenafil (REVATIO) 20 MG tablet Take 1 tablet (20 mg total) by mouth 3 (three) times daily. 08/24/20   Larey Dresser, MD  ?traMADol [facility]at[facility]) 50 MG tablet Take 1 tablet (50 mg total) by mouth every 6 (six) hours as needed. 04/26/21   Roxan Hockey, MD  ?diltiazem [facility]) 240 MG 24 hr capsule TAKE 1 CAPSULE DAILY 10/19/12 02/17/13  Vernie Shanks, MD  ?   ? ?Allergies    ?Codeine   ? ?Review of Systems   ?Review of Systems  ?Constitutional:  Negative for appetite change and fatigue.  ?HENT:  Negative for congestion, ear discharge and sinus pressure.   ?Eyes:  Negative for discharge.  ?Respiratory:  Negative for cough.   ?Cardiovascular:  Negative for chest pain.  ?Gastrointestinal:  Negative for abdominal pain and diarrhea.  ?Genitourinary:  Negative for frequency and hematuria.  ?Musculoskeletal:  Negative for back pain.  ?Skin:  Negative for rash.  ?Neurological:  Positive for weakness. Negative for seizures and headaches.  ?Psychiatric/Behavioral:  Negative for hallucinations.   ? ?Physical Exam ?Updated Vital Signs ?BP (!) 87/50   Pulse 74   Temp 97.8 ?F (36.6 ?C) (Oral)   Resp 15   Ht 5' 4"  (1.626 m)   Wt 113.3 kg   SpO2 95%   BMI 42.87 kg/m?  ?Physical Exam ?Vitals and nursing note reviewed.  ?Constitutional:   ?   Appearance: He is well-developed.  ?HENT:  ?   Head: Normocephalic.  ?   Nose: Nose normal.  ?Eyes:  ?   General: No scleral icterus. ?   Conjunctiva/sclera: Conjunctivae normal.  ?Neck:  ?   Thyroid: No thyromegaly.  ?   Trachea: No tracheal deviation.  ?Cardiovascular:  ?   Rate and Rhythm: Normal rate and regular rhythm.  ?   Heart sounds: No murmur heard. ?  No friction rub. No gallop.  ?Pulmonary:  ?   Breath sounds: No stridor. No wheezing or rales.  ?Chest:  ?   Chest wall: No tenderness.  ?Abdominal:  ?   General: There is distension.  ?   Tenderness: There is no abdominal tenderness. There is no  rebound.  ?   Comments: Distended abdomen  ?Musculoskeletal:     ?   General: Normal range of motion.  ?   Cervical back: Neck supple.  ?Lymphadenopathy:  ?   Cervical: No cervical adenopathy.  ?Skin: ?   General: Skin is warm.  ?   Findings: No erythema or rash.  ?Neurological:  ?   Mental Status: He is alert and oriented to person, place, and time.  ?   Motor: No abnormal muscle tone.  ?   Coordination:  Coordination normal.  ?Psychiatric:     ?   Behavior: Behavior normal.  ? ? ?ED Results / Procedures / Treatments   ?Labs ?(all labs ordered are listed, but only abnormal results are displayed) ?Labs Reviewed  ?COMPREHENSIVE METABOLIC PANEL - Abnormal; Notable for the following components:  ?    Result Value  ? BUN 40 (*)   ? Creatinine, Ser 6.83 (*)   ? Albumin 3.0 (*)   ? AST 528 (*)   ? ALT 296 (*)   ? GFR, Estimated 8 (*)   ? All other components within normal limits  ?CBC - Abnormal; Notable for the following components:  ? RBC 3.49 (*)   ? Hemoglobin 11.2 (*)   ? HCT 38.4 (*)   ? MCV 110.0 (*)   ? MCHC 29.2 (*)   ? RDW 25.9 (*)   ? Platelets 127 (*)   ? nRBC 1.9 (*)   ? All other components within normal limits  ?PROTIME-INR - Abnormal; Notable for the following components:  ? Prothrombin Time 23.1 (*)   ? INR 2.1 (*)   ? All other components within normal limits  ?APTT - Abnormal; Notable for the following components:  ? aPTT 51 (*)   ? All other components within normal limits  ?DIFFERENTIAL - Abnormal; Notable for the following components:  ? Abs Immature Granulocytes 0.12 (*)   ? All other components within normal limits  ?BRAIN NATRIURETIC PEPTIDE - Abnormal; Notable for the following components:  ? B Natriuretic Peptide 1,371.0 (*)   ? All other components within normal limits  ?I-STAT CHEM 8, ED - Abnormal; Notable for the following components:  ? BUN 36 (*)   ? Creatinine, Ser 7.70 (*)   ? Glucose, Bld 68 (*)   ? Calcium, Ion 1.05 (*)   ? All other components within normal limits  ?TROPONIN I (HIGH  SENSITIVITY) - Abnormal; Notable for the following components:  ? Troponin I (High Sensitivity) 417 (*)   ? All other components within normal limits  ?RESP PANEL BY RT-PCR (FLU A&B, COVID) [id number]  ?CULTURE, BLOOD (

## 2021-05-04 NOTE — Assessment & Plan Note (Signed)
-   Acute on chronic  ?- chronically on midodrine ?-Resuming midodrine 10 mg p.o. daily ?-Status post 1 L of IV fluid resuscitation ?-Continue gentle IV fluid pending hemodialysis ?-Ruling out sepsis, septic shock ?

## 2021-05-04 NOTE — Sepsis Progress Note (Signed)
ELink tracking the code Sepsis. ?

## 2021-05-04 NOTE — Progress Notes (Signed)
ANTICOAGULATION CONSULT NOTE - Initial Consult ? ?Pharmacy Consult for heparin gtt  ?Indication: atrial fibrillation ? ?Allergies  ?Allergen Reactions  ? Codeine   ?  Headache ?  ? Diclofenac   ? Ibuprofen   ? Indomethacin   ? Meloxicam   ? Naproxen   ? ? ?Patient Measurements: ?Height: 5' 4"  (162.6 cm) ?Weight: 113.7 kg (250 lb 10.6 oz) ?IBW/kg (Calculated) : 59.2 ?Heparin Dosing Weight: HEPARIN DW (KG): 85.9 ? ? ?Vital Signs: ?Temp: 97.8 ?F (36.6 ?C) (05/02 1612) ?Temp Source: Oral (05/02 1612) ?BP: 155/140 (05/02 1612) ?Pulse Rate: 60 (05/02 1612) ? ?Labs: ?Recent Labs  ?  05/04/21 ?1228 05/04/21 ?1236  ?HGB 11.2* 13.9  ?HCT 38.4* 41.0  ?PLT 127*  --   ?APTT 51*  --   ?LABPROT 23.1*  --   ?INR 2.1*  --   ?CREATININE 6.83* 7.70*  ? ? ?Estimated Creatinine Clearance: 10.4 mL/min (A) (by C-G formula based on SCr of 7.7 mg/dL (H)). ? ? ?Medical History: ?Past Medical History:  ?Diagnosis Date  ? Anemia   ? CAD (coronary artery disease)   ? LHC 2/08:  mRCA 50%, o/w no sig CAD, EF 25%  ? Cardiomyopathy ([place])   ? EF 35-40% in 2/16 in setting of AF with RVR >> echo 5/16 with improved LVF with EF 65-70%  ? CHF (congestive heart failure) ([place])   ? Chronic atrial fibrillation (HCC)   ? Chronic diastolic heart failure ([place])   ? HFPF - EF ~65-70% (OFTEN EXACERBATED BY AFIB)  ? Cirrhosis of liver ([place])   ? CKD (chronic kidney disease)   ? hx of worsening renal failure and hyperK+ in setting of acute diast CHF >> required CVVHD in 4/16 and dialysis x 1 in 5/16  ? CKD stage 3 due to type 2 diabetes mellitus ([place]) 09/17/2015  ? Colon cancer ([place])   ? Colon polyps 09/21/2012  ? Tubular adenoma  ? Diabetes ([place])   ? GERD (gastroesophageal reflux disease)   ? Hyperlipidemia   ? Hypertension   ? Hypothyroidism   ? Insomnia   ? Nonalcoholic steatohepatitis   ? Obesity hypoventilation syndrome ([place])   ? Pulmonary hypertension ([place])   ? Renal insufficiency   ? Sleep apnea   ? Thrombocytopenia ([place])   ? ? ?Medications:  ?Medications  Prior to Admission  ?Medication Sig Dispense Refill Last Dose  ? acetaminophen (TYLENOL) 160 MG/5ML liquid Take 650 mg by mouth every 4 (four) hours as needed for fever.   05/03/2021  ? allopurinol (ZYLOPRIM) 100 MG tablet Take 200 mg by mouth daily.   05/03/2021  ? Amino Acids-Protein Hydrolys (PRO-STAT) LIQD Take 30 mLs by mouth daily.   05/03/2021  ? apixaban (ELIQUIS) 5 MG TABS tablet Take 1 tablet (5 mg total) by mouth 2 (two) times daily. Restart on 04/29/21 60 tablet 3 05/03/2021 at 1800  ? atorvastatin (LIPITOR) 20 MG tablet TAKE 1 TABLET BY MOUTH EVERY DAY (Patient taking differently: Take 20 mg by mouth at bedtime.) 90 tablet 3 05/03/2021  ? B Complex-C (B-COMPLEX WITH VITAMIN C) tablet Take 1 tablet by mouth daily.   05/03/2021  ? Cholecalciferol (VITAMIN D) 125 MCG (5000 UT) CAPS Take 5,000 Units by mouth daily.   05/03/2021  ? ENULOSE 10 GM/15ML SOLN Take 20 g by mouth daily.   05/03/2021  ? ferrous sulfate 325 (65 FE) MG tablet Take 325 mg by mouth daily with breakfast.   05/03/2021  ? guaiFENesin (ROBITUSSIN) 100 MG/5ML liquid Take  5 mLs by mouth every 4 (four) hours as needed for cough or to loosen phlegm.   unknown  ? insulin aspart (NOVOLOG) 100 UNIT/ML injection Before each meal 3 times a day, 140-199 - 2 units, 200-250 - 4 units, 251-299 - 6 units,  300-349 - 8 units,  350 or above 10 units. ?Insulin PEN if approved, provide syringes and needles if needed. 10 mL 0 unknown  ? insulin detemir (LEVEMIR) 100 UNIT/ML injection Inject 0.08 mLs (8 Units total) into the skin at bedtime. 10 mL 11 unknown  ? levothyroxine (SYNTHROID) 100 MCG tablet Take 100 mcg by mouth every morning.   05/03/2021  ? melatonin 5 MG TABS Take 5 mg by mouth at bedtime as needed (insomnia).   unknown  ? metoprolol succinate (TOPROL-XL) 25 MG 24 hr tablet Take 25 mg by mouth daily.   05/03/2021 at 1300  ? midodrine (PROAMATINE) 10 MG tablet Take 1 tablet (10 mg total) by mouth 3 (three) times daily with meals. 90 tablet 3 05/03/2021  ? pantoprazole  (PROTONIX) 40 MG tablet TAKE 1 TABLET BY MOUTH EVERY DAY 90 tablet 1 05/03/2021  ? polyethylene glycol (MIRALAX / GLYCOLAX) 17 g packet Take 17 g by mouth daily. 14 each 0 05/03/2021  ? sildenafil (REVATIO) 20 MG tablet Take 1 tablet (20 mg total) by mouth 3 (three) times daily. 90 tablet 11 05/03/2021  ? traMADol (ULTRAM) 50 MG tablet Take 1 tablet (50 mg total) by mouth every 6 (six) hours as needed. 10 tablet 0 05/03/2021  ? glucose blood (CONTOUR NEXT TEST) test strip Test BS TID Dx E11.29 300 strip 3   ? Insulin Pen Needle 32G X 4 MM MISC Use to give insulin daily Dx E11.29 100 each 3   ? potassium chloride SA (KLOR-CON M) 20 MEQ tablet Take 20 mEq by mouth daily.     ? ?Scheduled:  ? allopurinol  200 mg Oral Daily  ? atorvastatin  20 mg Oral QHS  ? [START ON 05/05/2021] ferrous sulfate  325 mg Oral Q breakfast  ? heparin  4,000 Units Intravenous Once  ? insulin aspart  0-6 Units Subcutaneous TID WC  ? insulin detemir  8 Units Subcutaneous QHS  ? lactulose  20 g Oral BID  ? lactulose (encephalopathy)  20 g Oral Daily  ? [START ON 05/05/2021] levothyroxine  100 mcg Oral q morning  ? metoprolol succinate  25 mg Oral Daily  ? midodrine  10 mg Oral TID WC  ? nystatin cream   Topical BID  ? pantoprazole  40 mg Oral Daily  ? polyethylene glycol  17 g Oral Daily  ? sildenafil  20 mg Oral TID  ? sodium chloride flush  3 mL Intravenous Q12H  ? ?Infusions:  ? sodium chloride    ? [START ON 05/05/2021] ceFEPime (MAXIPIME) IV    ? heparin    ? metronidazole    ? [START ON 05/06/2021] vancomycin    ? vancomycin    ? ?PRN: sodium chloride, acetaminophen **OR** acetaminophen, hydrALAZINE, HYDROmorphone (DILAUDID) injection, ipratropium, levalbuterol, ondansetron **OR** ondansetron (ZOFRAN) IV, oxyCODONE, perflutren lipid microspheres (DEFINITY) IV suspension, senna-docusate, sodium chloride flush, traMADol, traZODone ?Anti-infectives (From admission, onward)  ? ? Start     Dose/Rate Route Frequency Ordered Stop  ? 05/06/21 1200  vancomycin  (VANCOCIN) IVPB 1000 mg/200 mL premix       ? 1,000 mg ?200 mL/hr over 60 Minutes Intravenous Every T-Th-Sa (Hemodialysis) 05/04/21 1531    ? 05/05/21 1800  ceFEPIme (MAXIPIME) 1 g in sodium chloride 0.9 % 100 mL IVPB       ? 1 g ?200 mL/hr over 30 Minutes Intravenous Every 24 hours 05/04/21 1438    ? 05/05/21 1200  ceFEPIme (MAXIPIME) 2 g in sodium chloride 0.9 % 100 mL IVPB  Status:  Discontinued       ? 2 g ?200 mL/hr over 30 Minutes Intravenous Every 24 hours 05/04/21 1219 05/04/21 1438  ? 05/04/21 1500  vancomycin (VANCOREADY) IVPB 2000 mg/400 mL       ? 2,000 mg ?200 mL/hr over 120 Minutes Intravenous  Once 05/04/21 1428    ? 05/04/21 1430  metroNIDAZOLE (FLAGYL) IVPB 500 mg       ? 500 mg ?100 mL/hr over 60 Minutes Intravenous Every 12 hours 05/04/21 1428 05/11/21 1429  ? 05/04/21 1200  ceFEPIme (MAXIPIME) 2 g in sodium chloride 0.9 % 100 mL IVPB       ? 2 g ?200 mL/hr over 30 Minutes Intravenous  Once 05/04/21 1155 05/04/21 1315  ? 05/04/21 1200  metroNIDAZOLE (FLAGYL) IVPB 500 mg       ? 500 mg ?100 mL/hr over 60 Minutes Intravenous  Once 05/04/21 1155 05/04/21 1422  ? ?  ? ? ?Assessment: ?STUART MIRABILE a 71 y.o. male requires anticoagulation with a heparin iv infusion for the indication of  atrial fibrillation. Heparin gtt will be started following pharmacy protocol per pharmacy consult. Patient is on previous oral anticoagulant, apixaban 20m po BID last dose 5/1@1800 , that will require aPTT/HL correlation before transitioning to only HL monitoring.  ? ?Goal of Therapy:  ?Heparin level 0.3-0.7 units/ml ?aPTT 66-102 seconds ?Monitor platelets by anticoagulation protocol: Yes ?  ?Plan:  ?Give 4000 units bolus x 1 ?Start heparin infusion at 1350 units/hr ?Check anti-Xa level in 6 hours and daily while on heparin ?Continue to monitor H&H and platelets ? ? ?GDonna ChristenCoffee ?05/04/2021,4:26 PM ? ? ?

## 2021-05-04 NOTE — Assessment & Plan Note (Signed)
-   In remission, last treatment 2014 ?

## 2021-05-04 NOTE — Consult Note (Addendum)
@LOGO @ ? ? ?Referring Provider: [facility]  ?Primary Care Physician:  [name], [name], MD ?Primary Gastroenterologist:  Dr. Jenetta Downer ? ?Date of Admission: 05/04/21 ?Date of Consultation: 05/04/21 ? ?Reason for Consultation:  Elevated LFTs ? ?HPI:  ?William Flores is a 71 y.o. year old male with a history of ESRD on hemodialysis, CAD, CHF, chronic component of hypotension on midodrine outpatient, on Eliquis with history of afib,distant history of colon cancer, NASH cirrhosis, IDA with GI bleeding previously, extensive GI evaluation previously, most recently with colonic dieulafoy lesion s/p APC therapy in April 2023,  OSA, presenting the emergency room today from [facility] with complaints of nausea, vomiting, generalized weakness that began yesterday, refusing to go to dialysis this morning, and reports of hypotension and bradycardia at [facility]. ? ?ED course: ?BP 80/53, pulse 85, O2 saturation 89% on room air improved to 93% on 2L [place], respiratory rate 21. ?Labs with WBC 5.8, hemoglobin 11.2 (improved from 7.3, 8 days ago), platelets 127, creatinine 6.83, albumin 3.0, AST 528, ALT 296, alk phos 91, total bilirubin 0.9, INR 2.1. ?Lactic acid 1.7. ?Troponin elevated at 417. ?BNP elevated at 1371. ?Fecal occult blood negative. ?Blood cultures drawn. ?EKG with atrial fibrillation, incomplete right bundle blanch block, evidence of prior anteroseptal infarct, abnormal Q waves ?Chest x-ray with patchy airspace opacities throughout the right greater than left lungs concerning for asymmetric edema and/or multifocal pneumonia, cardiomegaly and pulmonary vascular congestion, bilateral pleural effusions.  ? ?Consult:  ?Patient reports new onset nausea, vomiting, weakness, left-sided abdominal pain that started suddenly yesterday without specific triggering event.  Last episode of emesis was prior to presenting to the emergency room.  Abdominal pain is improved and now only with a mild soreness.  Denies  hematemesis.  Reports he has not had an appetite in about 3 days and had not been eating or drinking much at all.  Also with no bowel movement in 3 days.  Usually has bowel movements every other day.  States he does take something for constipation, but is not sure what it is, possibly lactulose.  Appears he has been prescribed lactulose 20 g daily.  He is passing gas.  Denies BRBPR or melena.  Denies abdominal distention or mental status changes/confusion.  No LE edema.  Chronic GERD well controlled on Protonix daily.  No dysphagia. ? ?Denies alcohol use, drug use.  Unable to tell me anything about his medications specifically, but unaware of any new medications. ? ?Denies chest pain, heart palpitations, shortness of breath, presyncope. ? ?Past Medical History:  ?Diagnosis Date  ? Anemia   ? CAD (coronary artery disease)   ? LHC 2/08:  mRCA 50%, o/w no sig CAD, EF 25%  ? Cardiomyopathy ([place])   ? EF 35-40% in 2/16 in setting of AF with RVR >> echo 5/16 with improved LVF with EF 65-70%  ? CHF (congestive heart failure) ([place])   ? Chronic atrial fibrillation (HCC)   ? Chronic diastolic heart failure ([place])   ? HFPF - EF ~65-70% (OFTEN EXACERBATED BY AFIB)  ? Cirrhosis of liver ([place])   ? CKD (chronic kidney disease)   ? hx of worsening renal failure and hyperK+ in setting of acute diast CHF >> required CVVHD in 4/16 and dialysis x 1 in 5/16  ? CKD stage 3 due to type 2 diabetes mellitus ([place]) 09/17/2015  ? Colon cancer ([place])   ? Colon polyps 09/21/2012  ? Tubular adenoma  ? Diabetes ([place])   ? GERD (gastroesophageal reflux  disease)   ? Hyperlipidemia   ? Hypertension   ? Hypothyroidism   ? Insomnia   ? Nonalcoholic steatohepatitis   ? Obesity hypoventilation syndrome ([place])   ? Pulmonary hypertension ([place])   ? Renal insufficiency   ? Sleep apnea   ? Thrombocytopenia ([place])   ? ? ?Past Surgical History:  ?Procedure Laterality Date  ? AV FISTULA PLACEMENT Right 03/02/2021  ? Procedure: RIGHT ARM ARTERIOVENOUS (AV) FISTULA  CREATION;  Surgeon: [name], MD;  Location: [facility];  Service: Vascular;  Laterality: Right;  ? CIRCUMCISION    ? COLON RESECTION    ? COLONOSCOPY N/A 09/21/2012  ? Procedure: COLONOSCOPY;  Surgeon: Inda Castle, MD;  Location: [facility];  Service: Endoscopy;  Laterality: N/A;  ? COLONOSCOPY N/A 12/28/2018  ? Procedure: COLONOSCOPY;  Surgeon: Rogene Houston, MD;  Location: [facility];  Service: Endoscopy;  Laterality: N/A;  ? COLONOSCOPY WITH PROPOFOL N/A 10/17/2020  ? Procedure: COLONOSCOPY WITH PROPOFOL;  Surgeon: Harvel Quale, MD;  Location: [facility];  Service: Gastroenterology;  Laterality: N/A;  ? COLONOSCOPY WITH PROPOFOL N/A 04/25/2021  ? Procedure: COLONOSCOPY WITH PROPOFOL;  Surgeon: Rogene Houston, MD;  [facility]: [facility];  Service: Endoscopy;  Laterality: N/A;  ? ENTEROSCOPY N/A 06/24/2020  ? Procedure: ENTEROSCOPY;  Surgeon: Gatha Mayer, MD;  Location: [facility];  Service: Endoscopy;  Laterality: N/A;  ? ESOPHAGOGASTRODUODENOSCOPY N/A 12/28/2018  ? Procedure: ESOPHAGOGASTRODUODENOSCOPY (EGD);  Surgeon: Rogene Houston, MD;  [facility];  Service: Endoscopy;  Laterality: N/A;  ? HOT HEMOSTASIS  04/25/2021  ? Procedure: HOT HEMOSTASIS (ARGON PLASMA COAGULATION/BICAP);  Surgeon: Rogene Houston, MD;  [facility]: [facility];  Service: Endoscopy;;  ? IR FLUORO GUIDE CV LINE RIGHT  12/10/2020  ? IR US GUIDE VASC ACCESS RIGHT  12/11/2020  ? POLYPECTOMY  10/17/2020  ? Procedure: POLYPECTOMY;  Surgeon: Montez Morita, Quillian Quince, MD;  Location: [facility];  Service: Gastroenterology;;  rectal, transverse, descending, ascending, sigmoid  ? RIGHT HEART CATH N/A 04/11/2019  ? Procedure: RIGHT HEART CATH;  Surgeon: Larey Dresser, MD;  Location: [facility];  Service: Cardiovascular;  Laterality: N/A;  ? US ECHOCARDIOGRAPHY  03/04/2010  ? abnormal study  ? ? ?Prior to Admission medications   ?Medication Sig Start Date End Date Taking? Authorizing  Provider  ?acetaminophen (TYLENOL) 160 MG/5ML liquid Take 650 mg by mouth every 4 (four) hours as needed for fever.    [provider]  ?allopurinol (ZYLOPRIM) 100 MG tablet Take 200 mg by mouth daily.    [provider]  ?Amino Acids-Protein Hydrolys (PRO-STAT) LIQD Take 30 mLs by mouth daily.    [provider]  ?apixaban (ELIQUIS) 5 MG TABS tablet Take 1 tablet (5 mg total) by mouth 2 (two) times daily. Restart on 04/29/21 04/29/21   Roxan Hockey, MD  ?atorvastatin (LIPITOR) 20 MG tablet TAKE 1 TABLET BY MOUTH EVERY DAY ?Patient taking differently: Take 20 mg by mouth at bedtime. 03/19/20   [name], [name], MD  ?B Complex-C (B-COMPLEX WITH VITAMIN C) tablet Take 1 tablet by mouth daily. 12/12/20   Florencia Reasons, MD  ?Cholecalciferol (VITAMIN D) 125 MCG (5000 UT) CAPS Take 5,000 Units by mouth daily.    [provider]  ?ENULOSE 10 GM/15ML SOLN Take 20 g by mouth daily. 04/13/21   [provider]  ?ferrous sulfate 325 (65 FE) MG tablet Take 325 mg by mouth daily with breakfast.  [provider]  ?glucose blood (CONTOUR NEXT TEST) test strip Test BS TID Dx E11.29 05/22/20   [name], [name], MD  ?guaiFENesin (ROBITUSSIN) 100 MG/5ML liquid Take 5 mLs by mouth every 4 (four) hours as needed for cough or to loosen phlegm.    [provider]  ?insulin aspart (NOVOLOG) 100 UNIT/ML injection Before each meal 3 times a day, 140-199 - 2 units, 200-250 - 4 units, 251-299 - 6 units,  300-349 - 8 units,  350 or above 10 units. ?Insulin PEN if approved, provide syringes and needles if needed. 12/11/20   Florencia Reasons, MD  ?insulin detemir (LEVEMIR) 100 UNIT/ML injection Inject 0.08 mLs (8 Units total) into the skin at bedtime. 04/27/21   Roxan Hockey, MD  ?Insulin Pen Needle 32G X 4 MM MISC Use to give insulin daily Dx E11.29 04/15/19   Charlynne Cousins, MD  ?levothyroxine (SYNTHROID) 100 MCG tablet Take 100 mcg by mouth every morning.    [provider]  ?melatonin 5 MG TABS Take 5 mg by mouth at bedtime as needed (insomnia).    [provider]  ?metoprolol succinate (TOPROL-XL) 25 MG 24 hr tablet Take 25 mg by mouth daily. 12/25/20   [provider]  ?

## 2021-05-04 NOTE — Assessment & Plan Note (Addendum)
-  Missed his hemodialysis today ?- Nephrologist consulted ?-Normal hemodialysis days Tuesdays, Thursdays, Saturdays ?-Patient refused today's hemodialysis, ?-Likely will resume hemodialysis tomorrow ?

## 2021-05-04 NOTE — Assessment & Plan Note (Signed)
-   Continue iron supplements ?-Monitoring H&H ?

## 2021-05-04 NOTE — Assessment & Plan Note (Addendum)
-  With history of of severe right sided heart failure ?- Currently stable, x-ray consistent with congestion, ?-Cardiology consulted -appreciate input ?- pending repeat echo ?-Last echo reviewed 11/22/2020 ?EJF: 55-60%, dilated LV, mild LVH, increase right ventricular pressure and volume overload, right ventricular systolic function reduced, right atrium severely dilated, Mild to moderate mitral valve regurgitation.  ?-Gentle IV fluid hydration now, subsequently we discontinued ?-Diuresing, follow-up hemodialysis ? ? ? ?

## 2021-05-04 NOTE — Assessment & Plan Note (Addendum)
-  Ruling out sepsis, septic shock, SIRS ?-Initially in the ED patient was thought to be septic due to hypotension, bradycardia, mildly tachypnea ?-Chest x-ray finding of congestion versus pneumonia ?-Cultures been obtained, 1 L IV fluid was given, broad-spectrum antibiotic was initiated ?-Patient less likely septic, findings be explained by other means: patient has a chronic hypotension on midodrine, no leukocytosis, afebrile ?-Initiated on broad-spectrum antibiotic anticipated discontinuing within next 24 hours if no further signs of infection ? ?-Status post 1 L of IV fluids, maintenance fluids for few hours, will be subsequently discontinued ?

## 2021-05-04 NOTE — H&P (Signed)
?History and Physical  ? ?Patient: William Flores                            PCP: [name], [name], MD                    ?DOB: September 26, 1950            DOA: 05/04/2021 ?JME:[id number]             DOS: 05/04/2021, 4:03 PM ? ?[name], [name], MD ? ?Patient coming from:   HOME  ?I have personally reviewed patient's medical records, in electronic medical records, including:  ?[place] link, and care everywhere.  ? ? ?Chief Complaint:  ? ?Chief Complaint  ?Patient presents with  ? Abdominal Pain  ? ? ?History of present illness:  ? ? ?GREGOR DERSHEM is a 71 year old male with multiple colors including ESRD on HD TTS, right-sided heart failure, hypotension, hypothyroidism, Nash-liver cirrhosis, chronic anemia, cytopenia, hypokalemia, DM II, A-fib  on Eliquis, obesity ...  ? ?Presenting today from [facility] with complaint of nausea vomiting and generalized weakness that started yesterday patient. ?He refuses hemodialysis today.  Patient was noted for hypotension, and bradycardia therefore was sent to the ED for further evaluation ? ? ? ?ED course: ?Blood pressure (!) 87/50, pulse 74, temperature 97.8 ?F (36.6 ?C), temperature source Oral, resp. rate 15, height 5' 4"  (1.626 m), weight 113.3 kg, SpO2 95 %. ?On 2 L of oxygen ? ?LABs: Lactic acid 1.7, WBC 5.8, hemoglobin 13.9, BUN 36, creatinine 7.70, troponin 417, INR 2.1, glucose 68, ? ? ?X-ray IMPRESSION: ?1. Patchy airspace opacities throughout the right greater than left ?lungs, concerning for asymmetric edema and or multifocal pneumonia. ?Recommend imaging follow-up to resolution. ?2. Cardiomegaly and pulmonary vascular congestion. ?3. Small bilateral pleural effusions. ? ?Initially thought the patient met sepsis, septic shock criteria therefore sepsis orders was initiated... Has received a liter of fluid, with broad-spectrum triple antibiotics ? ?Requested patient to be admitted for further evaluation...  ?Nephrology and cardiology was consulted ? ? ? Patient  Denies having: Fever, Chills, Cough, SOB, Chest Pain, Abd pain, N/V/D, headache, dizziness, lightheadedness,  Dysuria, Joint pain, rash, open wounds ? ? ? ?Review of Systems: As per HPI, otherwise 10 point review of systems were negative.  ? ?---------------------------------------------------------------------------------------------------------------------- ? ?Allergies  ?Allergen Reactions  ? Codeine   ?  Headache ?  ? Diclofenac   ? Ibuprofen   ? Indomethacin   ? Meloxicam   ? Naproxen   ? ? ?Home MEDs:  ?Prior to Admission medications   ?Medication Sig Start Date End Date Taking? Authorizing Provider  ?acetaminophen (TYLENOL) 160 MG/5ML liquid Take 650 mg by mouth every 4 (four) hours as needed for fever.    [provider]  ?allopurinol (ZYLOPRIM) 100 MG tablet Take 200 mg by mouth daily.    [provider]  ?Amino Acids-Protein Hydrolys (PRO-STAT) LIQD Take 30 mLs by mouth daily.    [provider]  ?apixaban (ELIQUIS) 5 MG TABS tablet Take 1 tablet (5 mg total) by mouth 2 (two) times daily. Restart on 04/29/21 04/29/21   Roxan Hockey, MD  ?atorvastatin (LIPITOR) 20 MG tablet TAKE 1 TABLET BY MOUTH EVERY DAY ?Patient taking differently: Take 20 mg by mouth at bedtime. 03/19/20   [name], [name], MD  ?B Complex-C (B-COMPLEX WITH VITAMIN C) tablet Take 1 tablet by mouth daily. 12/12/20   Erlinda Hong,  Annamaria Boots, MD  ?Cholecalciferol (VITAMIN D) 125 MCG (5000 UT) CAPS Take 5,000 Units by mouth daily.    [provider]  ?ENULOSE 10 GM/15ML SOLN Take 20 g by mouth daily. 04/13/21   [provider]  ?ferrous sulfate 325 (65 FE) MG tablet Take 325 mg by mouth daily with breakfast.    [provider]  ?glucose blood (CONTOUR NEXT TEST) test strip Test BS TID Dx E11.29 05/22/20   [name], [name], MD  ?guaiFENesin (ROBITUSSIN) 100 MG/5ML liquid Take 5 mLs by mouth every 4 (four) hours as needed for cough or to loosen phlegm.    [provider]  ?insulin aspart  (NOVOLOG) 100 UNIT/ML injection Before each meal 3 times a day, 140-199 - 2 units, 200-250 - 4 units, 251-299 - 6 units,  300-349 - 8 units,  350 or above 10 units. ?Insulin PEN if approved, provide syringes and needles if needed. 12/11/20   Florencia Reasons, MD  ?insulin detemir (LEVEMIR) 100 UNIT/ML injection Inject 0.08 mLs (8 Units total) into the skin at bedtime. 04/27/21   Roxan Hockey, MD  ?Insulin Pen Needle 32G X 4 MM MISC Use to give insulin daily Dx E11.29 04/15/19   Charlynne Cousins, MD  ?levothyroxine (SYNTHROID) 100 MCG tablet Take 100 mcg by mouth every morning.    [provider]  ?melatonin 5 MG TABS Take 5 mg by mouth at bedtime as needed (insomnia).    [provider]  ?metoprolol succinate (TOPROL-XL) 25 MG 24 hr tablet Take 25 mg by mouth daily. 12/25/20   [provider]  ?midodrine (PROAMATINE) 10 MG tablet Take 1 tablet (10 mg total) by mouth 3 (three) times daily with meals. 04/26/21   Roxan Hockey, MD  ?Nutritional Supplements (NOVASOURCE RENAL) LIQD Take 240 mLs by mouth at bedtime.    [provider]  ?nystatin cream (MYCOSTATIN) Apply topically. 03/05/21   [provider]  ?OXYGEN Inhale 2 L into the lungs continuous.    [provider]  ?pantoprazole (PROTONIX) 40 MG tablet TAKE 1 TABLET BY MOUTH EVERY DAY 06/02/20   [name], [name], MD  ?polyethylene glycol (MIRALAX / GLYCOLAX) 17 g packet Take 17 g by mouth daily. 01/01/19   Roxan Hockey, MD  ?potassium chloride SA (KLOR-CON M) 20 MEQ tablet Take 20 mEq by mouth daily.    [provider]  ?sildenafil (REVATIO) 20 MG tablet Take 1 tablet (20 mg total) by mouth 3 (three) times daily. 08/24/20   Larey Dresser, MD  ?traMADol Veatrice Bourbon) 50 MG tablet Take 1 tablet (50 mg total) by mouth every 6 (six) hours as needed. 04/26/21   Roxan Hockey, MD  ?diltiazem [facility]) 240 MG 24 hr capsule TAKE 1 CAPSULE DAILY 10/19/12 02/17/13  Vernie Shanks, MD  ? ? ?PRN MEDs: sodium  chloride, acetaminophen **OR** acetaminophen, hydrALAZINE, HYDROmorphone (DILAUDID) injection, ipratropium, levalbuterol, ondansetron **OR** ondansetron (ZOFRAN) IV, oxyCODONE, perflutren lipid microspheres (DEFINITY) IV suspension, senna-docusate, sodium chloride flush, traMADol, traZODone ? ?Past Medical History:  ?Diagnosis Date  ? Anemia   ? CAD (coronary artery disease)   ? LHC 2/08:  mRCA 50%, o/w no sig CAD, EF 25%  ? Cardiomyopathy ([place])   ? EF 35-40% in 2/16 in setting of AF with RVR >> echo 5/16 with improved LVF with EF 65-70%  ? CHF (congestive heart failure) ([place])   ? Chronic atrial fibrillation (HCC)   ? Chronic diastolic heart failure ([place])   ? HFPF - EF ~65-70% (OFTEN EXACERBATED  BY AFIB)  ? Cirrhosis of liver ([place])   ? CKD (chronic kidney disease)   ? hx of worsening renal failure and hyperK+ in setting of acute diast CHF >> required CVVHD in 4/16 and dialysis x 1 in 5/16  ? CKD stage 3 due to type 2 diabetes mellitus ([place]) 09/17/2015  ? Colon cancer ([place])   ? Colon polyps 09/21/2012  ? Tubular adenoma  ? Diabetes ([place])   ? GERD (gastroesophageal reflux disease)   ? Hyperlipidemia   ? Hypertension   ? Hypothyroidism   ? Insomnia   ? Nonalcoholic steatohepatitis   ? Obesity hypoventilation syndrome ([place])   ? Pulmonary hypertension ([place])   ? Renal insufficiency   ? Sleep apnea   ? Thrombocytopenia ([place])   ? ? ?Past Surgical History:  ?Procedure Laterality Date  ? AV FISTULA PLACEMENT Right 03/02/2021  ? Procedure: RIGHT ARM ARTERIOVENOUS (AV) FISTULA CREATION;  Surgeon: [name], MD;  Location: [facility] ORS;  Service: Vascular;  Laterality: Right;  ? CIRCUMCISION    ? COLON RESECTION    ? COLONOSCOPY N/A 09/21/2012  ? Procedure: COLONOSCOPY;  Surgeon: Inda Castle, MD;  Location: [facility] ENDOSCOPY;  Service: Endoscopy;  Laterality: N/A;  ? COLONOSCOPY N/A 12/28/2018  ? Procedure: COLONOSCOPY;  Surgeon: Rogene Houston, MD;  Location: [facility] ENDO SUITE;  Service: Endoscopy;  Laterality: N/A;  ? COLONOSCOPY  WITH PROPOFOL N/A 10/17/2020  ? Procedure: COLONOSCOPY WITH PROPOFOL;  Surgeon: Harvel Quale, MD;  Location: [facility] ENDO SUITE;  Service: Gastroenterology;  Laterality: N/A;  ? COLONOSCOPY WITH PROPOFO

## 2021-05-04 NOTE — Assessment & Plan Note (Signed)
-   We will resume home dose Levemir, ?-We will check CBG q. ACHS SSI coverage ?

## 2021-05-04 NOTE — Assessment & Plan Note (Signed)
-   Continue albuterol ?

## 2021-05-04 NOTE — Assessment & Plan Note (Signed)
-   Elevated LFTs ?-Avoiding hepatotoxins ?-We will continue to monitor ?

## 2021-05-04 NOTE — ED Triage Notes (Signed)
Pt to the ED via [facility] from [facility] with complaints of nausea and vomiting with generalized weakness than began yesterday.  ?  ?Pt refused dialysis this morning and normally has it Tuesday, Thursday and Saturday. ? ?The patient's abdomen is distended and states he can't remember the last time he had a BM. ? ?JC reports the pt was hypotensive and bradycardic. ? ?

## 2021-05-04 NOTE — Assessment & Plan Note (Addendum)
-   Patient denies any chest pain ?-No significant EKG changes ?-Elevated troponin in setting of hypotension, end-stage renal disease missed hemodialysis ?-Potassium at 4.2 ?-We will continue to monitor patient closely, currently asymptomatic ?-Initial troponin 417>> ?Recycle troponins -PRN nitroglycerin, morphine, aspirin ?-Cardiologist consulted, echo ordered ?Previous echo reviewed  ?

## 2021-05-04 NOTE — ED Notes (Signed)
Lab made aware of BNP add-on.  ?

## 2021-05-04 NOTE — Progress Notes (Signed)
Pt arrived to the floor with no dressing on HD catheter. Sutures are not intact. Site cleansed, bio patch placed and dressing applied. MD notified.  ?

## 2021-05-04 NOTE — Hospital Course (Signed)
William Flores is a 71 year old male with multiple colors including ESRD on HD TTS, right-sided heart failure, hypotension, hypothyroidism, Nash-liver cirrhosis, chronic anemia, cytopenia, hypokalemia, DM II, A-fib  on Eliquis, obesity ...  ? ?Presenting today from [facility] with complaint of nausea vomiting and generalized weakness that started yesterday patient. ?He refuses hemodialysis today.  Patient was noted for hypotension, and bradycardia therefore was sent to the ED for further evaluation ? ? ? ?ED course: ?Blood pressure (!) 87/50, pulse 74, temperature 97.8 ?F (36.6 ?C), temperature source Oral, resp. rate 15, height _0  (1.626 m), weight 113.3 kg, SpO2 95 %. ?On 2 L of oxygen ? ?LABs: Lactic acid 1.7, WBC 5.8, hemoglobin 13.9, BUN 36, creatinine 7.70, troponin 417, INR 2.1, glucose 68, ? ? ?X-ray IMPRESSION: ?1. Patchy airspace opacities throughout the right greater than left ?lungs, concerning for asymmetric edema and or multifocal pneumonia. ?Recommend imaging follow-up to resolution. ?2. Cardiomegaly and pulmonary vascular congestion. ?3. Small bilateral pleural effusions. ? ?Initially thought the patient met sepsis, septic shock criteria therefore sepsis orders was initiated... Has received a liter of fluid, with broad-spectrum triple antibiotics ? ?Requested patient to be admitted for further evaluation...  ?Nephrology and cardiology was consulted ?

## 2021-05-04 NOTE — ED Notes (Signed)
ECHO remains at bedside and removed BP cuff.  Will update vitals once ECHO is completed.  ?

## 2021-05-05 ENCOUNTER — Inpatient Hospital Stay (HOSPITAL_COMMUNITY): Payer: Medicare Other

## 2021-05-05 ENCOUNTER — Encounter (HOSPITAL_COMMUNITY): Payer: Self-pay | Admitting: Family Medicine

## 2021-05-05 DIAGNOSIS — R6521 Severe sepsis with septic shock: Secondary | ICD-10-CM | POA: Diagnosis not present

## 2021-05-05 DIAGNOSIS — K920 Hematemesis: Secondary | ICD-10-CM | POA: Diagnosis not present

## 2021-05-05 DIAGNOSIS — J9602 Acute respiratory failure with hypercapnia: Secondary | ICD-10-CM | POA: Diagnosis not present

## 2021-05-05 DIAGNOSIS — J9601 Acute respiratory failure with hypoxia: Secondary | ICD-10-CM

## 2021-05-05 DIAGNOSIS — Z7189 Other specified counseling: Secondary | ICD-10-CM

## 2021-05-05 DIAGNOSIS — R1010 Upper abdominal pain, unspecified: Secondary | ICD-10-CM | POA: Diagnosis not present

## 2021-05-05 DIAGNOSIS — Z515 Encounter for palliative care: Secondary | ICD-10-CM

## 2021-05-05 DIAGNOSIS — K7581 Nonalcoholic steatohepatitis (NASH): Secondary | ICD-10-CM | POA: Diagnosis not present

## 2021-05-05 DIAGNOSIS — A419 Sepsis, unspecified organism: Secondary | ICD-10-CM | POA: Diagnosis not present

## 2021-05-05 DIAGNOSIS — K5900 Constipation, unspecified: Secondary | ICD-10-CM | POA: Diagnosis not present

## 2021-05-05 LAB — COMPREHENSIVE METABOLIC PANEL
ALT: 266 U/L — ABNORMAL HIGH (ref 0–44)
AST: 374 U/L — ABNORMAL HIGH (ref 15–41)
Albumin: 3.2 g/dL — ABNORMAL LOW (ref 3.5–5.0)
Alkaline Phosphatase: 77 U/L (ref 38–126)
Anion gap: 14 (ref 5–15)
BUN: 47 mg/dL — ABNORMAL HIGH (ref 8–23)
CO2: 19 mmol/L — ABNORMAL LOW (ref 22–32)
Calcium: 8.4 mg/dL — ABNORMAL LOW (ref 8.9–10.3)
Chloride: 103 mmol/L (ref 98–111)
Creatinine, Ser: 7.19 mg/dL — ABNORMAL HIGH (ref 0.61–1.24)
GFR, Estimated: 8 mL/min — ABNORMAL LOW (ref >60)
Glucose, Bld: 113 mg/dL — ABNORMAL HIGH (ref 70–99)
Potassium: 4.8 mmol/L (ref 3.5–5.1)
Sodium: 136 mmol/L (ref 135–145)
Total Bilirubin: 1.7 mg/dL — ABNORMAL HIGH (ref 0.3–1.2)
Total Protein: 6.6 g/dL (ref 6.5–8.1)

## 2021-05-05 LAB — LIPASE, BLOOD: Lipase: 21 U/L (ref 11–51)

## 2021-05-05 LAB — HEPARIN LEVEL (UNFRACTIONATED): Heparin Unfractionated: >1.1 IU/mL — ABNORMAL HIGH (ref 0.30–0.70)

## 2021-05-05 LAB — APTT: aPTT: 49 seconds — ABNORMAL HIGH (ref 24–36)

## 2021-05-05 LAB — BLOOD GAS, ARTERIAL
Acid-base deficit: 5.9 mmol/L — ABNORMAL HIGH (ref 0.0–2.0)
Bicarbonate: 21.4 mmol/L (ref 20.0–28.0)
Drawn by: 38235
FIO2: 90 %
O2 Saturation: 99.4 %
Patient temperature: 37
pCO2 arterial: 50 mmHg — ABNORMAL HIGH (ref 32–48)
pH, Arterial: 7.24 — ABNORMAL LOW (ref 7.35–7.45)
pO2, Arterial: 123 mmHg — ABNORMAL HIGH (ref 83–108)

## 2021-05-05 LAB — GLUCOSE, CAPILLARY
Glucose-Capillary: 120 mg/dL — ABNORMAL HIGH (ref 70–99)
Glucose-Capillary: 122 mg/dL — ABNORMAL HIGH (ref 70–99)
Glucose-Capillary: 100 mg/dL — ABNORMAL HIGH (ref 70–99)
Glucose-Capillary: 119 mg/dL — ABNORMAL HIGH (ref 70–99)

## 2021-05-05 LAB — PROTIME-INR
INR: 2.2 — ABNORMAL HIGH (ref 0.8–1.2)
Prothrombin Time: 24.3 seconds — ABNORMAL HIGH (ref 11.4–15.2)

## 2021-05-05 LAB — MRSA NEXT GEN BY PCR, NASAL: MRSA by PCR Next Gen: NOT DETECTED

## 2021-05-05 LAB — CBC
HCT: 37.9 % — ABNORMAL LOW (ref 39.0–52.0)
Hemoglobin: 10.5 g/dL — ABNORMAL LOW (ref 13.0–17.0)
MCH: 29.7 pg (ref 26.0–34.0)
MCHC: 27.7 g/dL — ABNORMAL LOW (ref 30.0–36.0)
MCV: 107.4 fL — ABNORMAL HIGH (ref 80.0–100.0)
Platelets: 98 10*3/uL — ABNORMAL LOW (ref 150–400)
RBC: 3.53 MIL/uL — ABNORMAL LOW (ref 4.22–5.81)
RDW: 26.3 % — ABNORMAL HIGH (ref 11.5–15.5)
WBC: 4.8 10*3/uL (ref 4.0–10.5)
nRBC: 3.7 % — ABNORMAL HIGH (ref 0.0–0.2)

## 2021-05-05 LAB — PREPARE RBC (CROSSMATCH)

## 2021-05-05 LAB — MAGNESIUM: Magnesium: 2.3 mg/dL (ref 1.7–2.4)

## 2021-05-05 MED ORDER — SODIUM CHLORIDE 0.9 % IV SOLN
50.0000 ug/h | INTRAVENOUS | Status: DC
  Administered 2021-05-05 – 2021-05-07 (×5): 50 ug/h via INTRAVENOUS
  Filled 2021-05-05 (×14): qty 1

## 2021-05-05 MED ORDER — OCTREOTIDE ACETATE 500 MCG/ML IJ SOLN
INTRAMUSCULAR | Status: AC
  Filled 2021-05-05: qty 1

## 2021-05-05 MED ORDER — CHLORHEXIDINE GLUCONATE CLOTH 2 % EX PADS
6.0000 | MEDICATED_PAD | Freq: Every day | CUTANEOUS | Status: DC
  Administered 2021-05-05 – 2021-05-13 (×9): 6 via TOPICAL

## 2021-05-05 MED ORDER — NOREPINEPHRINE 4 MG/250ML-% IV SOLN: 0.0000 ug/min | INTRAVENOUS | Status: DC

## 2021-05-05 MED ORDER — SODIUM CHLORIDE 0.9 % IV SOLN
250.0000 mL | INTRAVENOUS | Status: DC
Start: 2021-05-05 — End: 2021-05-17

## 2021-05-05 MED ORDER — CHLORHEXIDINE GLUCONATE CLOTH 2 % EX PADS
6.0000 | MEDICATED_PAD | Freq: Every day | CUTANEOUS | Status: DC
  Administered 2021-05-06 – 2021-05-16 (×11): 6 via TOPICAL

## 2021-05-05 MED ORDER — NOREPINEPHRINE 4 MG/250ML-% IV SOLN
2.0000 ug/min | INTRAVENOUS | Status: DC
  Administered 2021-05-05: 2 ug/min via INTRAVENOUS
  Administered 2021-05-06: 5 ug/min via INTRAVENOUS
  Administered 2021-05-07 (×2): 6 ug/min via INTRAVENOUS
  Administered 2021-05-08: 4 ug/min via INTRAVENOUS
  Administered 2021-05-09: 1 ug/min via INTRAVENOUS
  Administered 2021-05-09: 5 ug/min via INTRAVENOUS
  Filled 2021-05-05 (×7): qty 250

## 2021-05-05 MED ORDER — OCTREOTIDE LOAD VIA INFUSION
50.0000 ug | Freq: Once | INTRAVENOUS | Status: AC
  Administered 2021-05-05: 50 ug via INTRAVENOUS
  Filled 2021-05-05: qty 25

## 2021-05-05 NOTE — Telephone Encounter (Signed)
Shyteria Lewis at [facility] will be faxing over the labs they had drawn on the patient. She says the patient is now an inpatient. He had labs drawn during this most recent stay. ?

## 2021-05-05 NOTE — Progress Notes (Addendum)
Inpatient Diabetes Program Recommendations ? ?AACE/ADA: New Consensus Statement on Inpatient Glycemic Control  ? ?Target Ranges:  Prepandial:   less than 140 mg/dL ?     Peak postprandial:   less than 180 mg/dL (1-2 hours) ?     Critically ill patients:  140 - 180 mg/dL  ? ? Latest Reference Range & Units 05/04/21 16:28 05/04/21 22:17 05/04/21 23:23 05/05/21 07:33  ?Glucose-Capillary 70 - 99 mg/dL 72 65 (L) 80 120 (H)  ? ? Latest Reference Range & Units Most Recent  ?Hemoglobin A1C 4.8 - 5.6 % 4.3 (L) ?05/04/21 12:28  ? ?Review of Glycemic Control ? ?Diabetes history: DM2 ?Outpatient Diabetes medications: Levemir 8 units QHS, Novolog 0-10 units TID with meals ?Current orders for Inpatient glycemic control: Levemir 8 units QHS, Novolog 0-6 units TID with meals ? ?Inpatient Diabetes Program Recommendations:   ? ?Insulin: No insulin given since arrival to hospital. Please discontinue Levemir. ? ?HbgA1C:  A1C 4.3% on 05/04/21 indicating an average glucose of 77 mg/dl over the past 2-3 months. May want to consider adjusting outpatient DM medications at time of discharge. ? ?NOTE: Diabetes coordinator working remotely. Noted glucose of 65 mg/dl on 05/04/21 and A1C 4.3% on 05/04/21. Question if patient is having hypoglycemia outpatient as well.  Attempted to call patient's room phone but no answer.  ? ?Thanks, ?Barnie Alderman, RN, MSN, CDE ?Diabetes Coordinator ?Inpatient Diabetes Program ?[phone] (Team Pager from 8am to 5pm) ? ? ? ? ?

## 2021-05-05 NOTE — Progress Notes (Signed)
?PROGRESS NOTE ? ? ? ?[name]  GBE:[id number] DOB: 1950-04-12 DOA: 05/04/2021 ?PCP: [name], [name], MD  ? ?  ?Brief Narrative:  ?William Flores is a 71 year old male with multiple medical comorbidities including ESRD on HD TTS, right-sided heart failure, hypotension, hypothyroidism, Nash-liver cirrhosis, chronic anemia, cytopenia, hypokalemia, DM II, A-fib on Eliquis, obesity who presents from [facility] with complaint of nausea, vomiting, and generalized weakness that started yesterday.  Due to his symptoms, patient did not undergo dialysis as scheduled on 5/2.  In the emergency department, patient met sepsis criteria, concerning for multifocal pneumonia.  Patient was started on IV antibiotics and admitted to the hospital.  Nephrology and cardiology were also consulted. ? ?New events last 24 hours / Subjective: ?Patient had episode of coffee-ground emesis overnight.  His hemoglobin dropped and he required 2 unit packed red blood cell transfusions.  Patient's main complaint is being thirsty and hungry.  Continues to have some generalized abdominal pain, worse in the epigastrium.  Also has been hypotensive and hypoxic. ? ?Assessment & Plan: ?Principal Problem: ?  Septic shock ([place]) ?Active Problems: ?  ESRD (end stage renal disease) on dialysis [facility]) ?  Hypotension ?  Elevated troponin ?  Chronic right heart failure (HCC) ?  OSA (obstructive sleep apnea) ?  Atrial fibrillation ([place]) ?  Chronic respiratory failure with hypoxia (HCC) ?  Gout ?  History of colon cancer ?  DM (diabetes mellitus), type 2 with renal complications ([place]) ?  Mixed hyperlipidemia ?  Iron deficiency anemia ?  Liver cirrhosis secondary to NASH [facility]) ?  Morbid obesity ([place]) ?  Nonischemic cardiomyopathy ([place]) ?  Pulmonary hypertension, unspecified ([place]) ?  GERD (gastroesophageal reflux disease) ?  Nausea and vomiting ?  Pain of upper abdomen ?  Constipation ? ? ?Septic shock secondary to multifocal pneumonia ?-He does have chronic  hypotension on midodrine ?-As patient is n.p.o. due to his coffee-ground emesis, he was started on Levophed this morning ?-Continue broad-spectrum antibiotics including cefepime, Flagyl ?-MRSA PCR negative, stop vanco ? ?Acute on chronic hypoxemic respiratory failure ?-Requires 2 L nasal cannula O2 at baseline ?-Insetting of multifocal pneumonia, missed dialysis sessions ?-Currently on HF O2  ? ?Hematemesis, acute blood loss anemia ?-Transfused 2 unit packed red blood cells ?-Octreotide, PPI ?-Hold Eliquis, IV heparin ?-Per GI ? ?ESRD on HD TTS ?-Nephrology consulted, planning for dialysis tomorrow if he remains hemodynamically stable ? ?Chronic A-fib ?-Eliquis/IV heparin on hold due to hematemesis ?-Metoprolol on hold due to hypotension ? ?Chronic diastolic heart failure/RV failure  ?NICM ?-Volume management via hemodialysis ?-Cardiology following ? ?Pulmonary hypertension ?-Sildenafil ? ?NASH cirrhosis with elevated LFTs ?-Lactulose  ?-Per GI ? ?Nausea, vomiting, abdominal pain ?-CT abdomen pelvis revealed possible acute pancreatitis, lipase 19 ?-Per GI ? ?Diabetes mellitus type 2 with renal complications ?-X5O 4.3 ?-Discussed with diabetic coordinator, stop Levemir ?-Continue SSI ? ?Demand ischemia ?-In setting of hypotension, ESRD ?-Echocardiogram showed EF 60 to 65%, no regional wall motion abnormality, RV pressure overload with reduced RV systolic function ? ?Hypothyroidism ?-Synthroid  ? ?HLD ?-Lipitor  ? ? ?DVT prophylaxis:  ?TED hose Start: 05/04/21 1424 ?SCDs Start: 05/04/21 1424 ? ?Code Status: Full ?Family Communication: updated daughter by phone  ?Disposition Plan:  ?Status is: Inpatient ?Remains inpatient appropriate because: hemodynamically unstable  ? ?Consultants:  ?GI ?Cardiology ?Nephrology ? ? ?Antimicrobials:  ?Anti-infectives (From admission, onward)  ? ? Start     Dose/Rate Route Frequency Ordered Stop  ? 05/06/21 1200  vancomycin (VANCOCIN)  IVPB 1000 mg/200 mL premix       ? 1,000 mg ?200  mL/hr over 60 Minutes Intravenous Every T-Th-Sa (Hemodialysis) 05/04/21 1531    ? 05/05/21 1800  ceFEPIme (MAXIPIME) 1 g in sodium chloride 0.9 % 100 mL IVPB       ? 1 g ?200 mL/hr over 30 Minutes Intravenous Every 24 hours 05/04/21 1438    ? 05/05/21 1200  ceFEPIme (MAXIPIME) 2 g in sodium chloride 0.9 % 100 mL IVPB  Status:  Discontinued       ? 2 g ?200 mL/hr over 30 Minutes Intravenous Every 24 hours 05/04/21 1219 05/04/21 1438  ? 05/04/21 1500  vancomycin (VANCOREADY) IVPB 2000 mg/400 mL       ? 2,000 mg ?200 mL/hr over 120 Minutes Intravenous  Once 05/04/21 1428 05/04/21 1827  ? 05/04/21 1430  metroNIDAZOLE (FLAGYL) IVPB 500 mg       ? 500 mg ?100 mL/hr over 60 Minutes Intravenous Every 12 hours 05/04/21 1428 05/11/21 1429  ? 05/04/21 1200  ceFEPIme (MAXIPIME) 2 g in sodium chloride 0.9 % 100 mL IVPB       ? 2 g ?200 mL/hr over 30 Minutes Intravenous  Once 05/04/21 1155 05/04/21 1315  ? 05/04/21 1200  metroNIDAZOLE (FLAGYL) IVPB 500 mg       ? 500 mg ?100 mL/hr over 60 Minutes Intravenous  Once 05/04/21 1155 05/04/21 1422  ? ?  ? ? ? ?Objective: ?Vitals:  ? 05/05/21 1230 05/05/21 1245 05/05/21 1300 05/05/21 1315  ?BP: (!) 88/53 (!) 108/53 (!) 82/57 (!) 99/54  ?Pulse: 80 83 88 78  ?Resp: 19 19 20 19   ?Temp:      ?TempSrc:      ?SpO2: (!) 88% (!) 87% (!) 85% 100%  ?Weight:      ?Height:      ? ? ?Intake/Output Summary (Last 24 hours) at 05/05/2021 1324 ?Last data filed at 05/05/2021 0740 ?Gross per 24 hour  ?Intake 961.87 ml  ?Output --  ?Net 961.87 ml  ? ?Filed Weights  ? 05/04/21 1144 05/04/21 1612 05/05/21 0500  ?Weight: 113.3 kg 113.7 kg 112.8 kg  ? ? ?Examination:  ?General exam: Appears calm  ?Respiratory system: Clear to auscultation anteriorly, with normal respiratory effort ?Cardiovascular system: S1 & S2 heard, RRR. No murmurs.  ?Gastrointestinal system: Abdomen is nondistended, soft, tender to palpation epigastrium ?Central nervous system: Alert and oriented. No focal neurological deficits.  ?Extremities:  Symmetric ?Skin: No rashes, lesions or ulcers on exposed skin  ?Psychiatry: Judgement and insight appear stable  ? ?Data Reviewed: I have personally reviewed following labs and imaging studies ? ?CBC: ?Recent Labs  ?Lab 05/04/21 ?1228 05/04/21 ?1236 05/04/21 ?2311 05/05/21 ?0750  ?WBC 5.8  --   --  4.8  ?NEUTROABS 4.2  --   --   --   ?HGB 11.2* 13.9 8.7* 10.5*  ?HCT 38.4* 41.0 31.7* 37.9*  ?MCV 110.0*  --   --  107.4*  ?PLT 127*  --   --  98*  ? ?Basic Metabolic Panel: ?Recent Labs  ?Lab 05/04/21 ?1228 05/04/21 ?1236 05/05/21 ?0750  ?NA 136 136 136  ?K 4.3 4.2 4.8  ?CL 102 100 103  ?CO2 25  --  19*  ?GLUCOSE 72 68* 113*  ?BUN 40* 36* 47*  ?CREATININE 6.83* 7.70* 7.19*  ?CALCIUM 8.9  --  8.4*  ?MG  --   --  2.3  ? ?GFR: ?Estimated Creatinine Clearance: 11.1 mL/min (A) (by C-G formula  based on SCr of 7.19 mg/dL (H)). ?Liver Function Tests: ?Recent Labs  ?Lab 05/04/21 ?1228 05/05/21 ?0750  ?AST 528* 374*  ?ALT 296* 266*  ?ALKPHOS 91 77  ?BILITOT 0.9 1.7*  ?PROT 7.3 6.6  ?ALBUMIN 3.0* 3.2*  ? ?Recent Labs  ?Lab 05/04/21 ?1228 05/05/21 ?0750  ?LIPASE 19 21  ? ?No results for input(s): AMMONIA in the last 168 hours. ?Coagulation Profile: ?Recent Labs  ?Lab 05/04/21 ?1228 05/05/21 ?0750  ?INR 2.1* 2.2*  ? ?Cardiac Enzymes: ?No results for input(s): CKTOTAL, CKMB, CKMBINDEX, TROPONINI in the last 168 hours. ?BNP (last 3 results) ?No results for input(s): PROBNP in the last 8760 hours. ?HbA1C: ?Recent Labs  ?  05/04/21 ?1228  ?HGBA1C 4.3*  ? ?CBG: ?Recent Labs  ?Lab 05/04/21 ?1628 05/04/21 ?2217 05/04/21 ?2323 05/05/21 ?9563 05/05/21 ?1135  ?GLUCAP 72 65* 80 120* 122*  ? ?Lipid Profile: ?No results for input(s): CHOL, HDL, LDLCALC, TRIG, CHOLHDL, LDLDIRECT in the last 72 hours. ?Thyroid Function Tests: ?No results for input(s): TSH, T4TOTAL, FREET4, T3FREE, THYROIDAB in the last 72 hours. ?Anemia Panel: ?No results for input(s): VITAMINB12, FOLATE, FERRITIN, TIBC, IRON, RETICCTPCT in the last 72 hours. ?Sepsis Labs: ?Recent  Labs  ?Lab 05/04/21 ?1228 05/04/21 ?1439  ?PROCALCITON  --  0.53  ?LATICACIDVEN 1.7  --   ? ? ?Recent Results (from the past 240 hour(s))  ?Blood Culture (routine x 2)     Status: None (Preliminary resu

## 2021-05-05 NOTE — Progress Notes (Addendum)
Late entry ? ?Patient had one episode coffee ground emesis earlier in shift. Labs checked, hgb dropped to 8. 7. Patient remains hypotensive. Heparin gtt stopped. 2 units PRBC's ordered. Patient also with increased O2 demand throughout shift. Increased O2 from 2L to 15MF, MD and respiratory aware. RRT placed patient on HHF. ABD drawn. Patient taken to CT as well. MD aware of all. Will continue to monitor.  ?

## 2021-05-05 NOTE — TOC Initial Note (Signed)
Transition of Care (TOC) - Initial/Assessment Note  ? ? ?Patient Details  ?Name: William Flores ?MRN: [id number] ?Date of Birth: August 02, 1950 ? ?Transition of Care (TOC) CM/SW Contact:    ?Iona Beard, LCSWA ?Phone Number: ?05/05/2021, 11:33 AM ? ?Clinical Narrative:                 ?Pt is high risk for readmission. TOC consulted for PCP needs and HH/DME needs by admitting provider. Per chart review it is noted that pt is a long term resident at [facility]. Pt is seen by providers at [facility]. For these reasons [facility] consult will be cleared at this time as plan will be for return to LTC facility.  ? ?CSW spoke to [facility] in admissions with facility. Pt will be able to return at D/C. Melissa requests that a new Fl2 be completed and sent in the [place] prior to D/C. TOC to follow.  ? ?Expected Discharge Plan: [place] ?Barriers to Discharge: Continued Medical Work up ? ? ?Patient Goals and CMS Choice ?Patient states their goals for this hospitalization and ongoing recovery are:: Go to LTC facility ?CMS Medicare.gov Compare Post Acute Care list provided to:: Patient ?Choice offered to / list presented to : Patient ? ?Expected Discharge Plan and Services ?Expected Discharge Plan: [place] ?In-house Referral: Clinical Social Work ?  ?Post Acute Care Choice: Resumption of Svcs/PTA Provider ?Living arrangements for the past 2 months: [place] ?                ?  ?  ?  ?  ?  ?  ?  ?  ?  ?  ? ?Prior Living Arrangements/Services ?Living arrangements for the past 2 months: [place] ?Lives with:: Facility Resident ?Patient language and need for interpreter reviewed:: Yes ?Do you feel safe going back to the place where you live?: Yes      ?Need for Family Participation in Patient Care: No (Comment) ?Care giver support system in place?: Yes (comment) ?  ?Criminal Activity/Legal Involvement Pertinent to Current Situation/Hospitalization: No - Comment as  needed ? ?Activities of Daily Living ?Home Assistive Devices/Equipment: Gilford Rile (specify type), Wheelchair ?ADL Screening (condition at time of admission) ?Patient's cognitive ability adequate to safely complete daily activities?: Yes ?Is the patient deaf or have difficulty hearing?: No ?Does the patient have difficulty seeing, even when wearing glasses/contacts?: No ?Does the patient have difficulty concentrating, remembering, or making decisions?: No ?Patient able to express need for assistance with ADLs?: Yes ?Does the patient have difficulty dressing or bathing?: No ?Independently performs ADLs?: No ?Communication: Independent ?Dressing (OT): Needs assistance ?Is this a change from baseline?: Pre-admission baseline ?Grooming: Needs assistance ?Is this a change from baseline?: Pre-admission baseline ?Feeding: Needs assistance ?Is this a change from baseline?: Pre-admission baseline ?Bathing: Needs assistance ?Is this a change from baseline?: Pre-admission baseline ?Toileting: Needs assistance ?Is this a change from baseline?: Pre-admission baseline ?In/Out Bed: Needs assistance ?Is this a change from baseline?: Pre-admission baseline ?Walks in Home: Needs assistance ?Is this a change from baseline?: Pre-admission baseline ?Does the patient have difficulty walking or climbing stairs?: Yes ?Weakness of Legs: Both ?Weakness of Arms/Hands: Both ? ?Permission Sought/Granted ?  ?  ?   ?   ?   ?   ? ?Emotional Assessment ?Appearance:: Appears stated age ?  ?  ?Orientation: : Oriented to Self, Oriented to Place, Oriented to  Time, Oriented to Situation ?Alcohol / Substance  Use: Not Applicable ?Psych Involvement: No (comment) ? ?Admission diagnosis:  Septic shock ([place]) [A41.9, R65.21] ?Hypotension determined by examination [I95.9] ?Patient Active Problem List  ? Diagnosis Date Noted  ? Septic shock ([place]) 05/04/2021  ? Hypotension 05/04/2021  ?  Class: Acute  ? Elevated troponin 05/04/2021  ?  Class: Acute  ? Nausea and  vomiting   ? Pain of upper abdomen   ? Constipation   ? Acute GI bleeding 04/22/2021  ? Gross hematuria 02/02/2021  ? Balanitis 02/02/2021  ? ESRD (end stage renal disease) on dialysis ([place]) 02/02/2021  ? Pressure injury of skin 11/25/2020  ? Acute exacerbation of CHF (congestive heart failure) ([place]) 11/22/2020  ? Elevated brain natriuretic peptide (BNP) level 11/22/2020  ? Thrombocytopenia ([place]) 11/22/2020  ? GERD (gastroesophageal reflux disease) 11/22/2020  ? Insomnia 11/22/2020  ? Ascites 11/12/2020  ? Acquired thrombophilia ([place]) 10/14/2020  ? Liver cirrhosis secondary to NASH ([place]) 10/14/2020  ? Iron deficiency anemia 07/21/2020  ? Syncope and collapse   ? Syncope 06/19/2020  ? Hypokalemia 06/19/2020  ? Cor pulmonale, acute ([place]) 04/15/2019  ? Bilateral leg edema   ? Acute on chronic congestive heart failure ([place]) 03/29/2019  ? Pulmonary hypertension, unspecified ([place]) 12/28/2018  ? Mixed hyperlipidemia 05/18/2017  ? Morbid obesity with BMI of 45.0-49.9, adult ([place]) 05/18/2017  ? MGUS (monoclonal gammopathy of unknown significance) 05/27/2015  ? Acute on chronic anemia 11/04/2014  ? Peripheral edema   ? Essential hypertension   ? Chronic respiratory failure with hypoxia (HCC)   ? DM (diabetes mellitus), type 2 with renal complications ([place]) 23/55/7322  ? Atrial fibrillation ([place]) 04/14/2014  ? Nonischemic cardiomyopathy ([place]) 02/20/2014  ? OSA (obstructive sleep apnea)   ? Chronic right heart failure ([place]) 02/10/2014  ? Morbid obesity ([place]) 08/05/2013  ? History of colon cancer   ? Gout 05/25/2012  ? ?PCP:  [name], [name], MD ?Pharmacy:   ?[id number] [place] [place] - [address]?Phone: [phone]: [phone]? ?[place]?[address] ?[place]?Phone: [phone]: [phone]? ?[place]?[address]?Phone: [phone]: [phone]? ? ? ? ?Social Determinants of Health (SDOH) Interventions ?  ? ?Readmission Risk Interventions ? ?  05/05/2021  ? 11:32 AM 04/23/2021  ?  1:14 PM 11/23/2020  ? 10:58 AM  ?Readmission Risk Prevention Plan  ?Transportation Screening Complete Complete Complete  ?Medication Review (Press photographer Complete Complete Complete  ?PCP or Specialist appointment within 3-5 days of discharge Complete  Complete  ?[place] [facility] Consult Complete Complete Complete  ?SW Recovery Care/Counseling Consult Complete Complete Complete  ?Palliative Care Screening Not Applicable Not Applicable   ?Skilled Nursing Facility Complete Complete Complete  ? ? ? ?

## 2021-05-05 NOTE — Progress Notes (Signed)
Patient with coffee ground emesis and hypotension over night. Drop in hgb. Ordered 2 units prbcs, and 2 on hold. Abdomen was distended so ct abdomen was done that shows possible pancreatitis. Added on lipase to am labs. Octreotide started. Patient is already on cefepime. Protonix 50m iv bid started. Gastro updated and requests patient to be NPO.

## 2021-05-05 NOTE — Progress Notes (Signed)
Came to see patient due to desatting episodes. Pt. Has OSA but is non complaint. Patient has been throwing up coffee ground emesis today (most recent about an hour ago). Tried patient on salter HFNC but was unable to maintain an O2 sat above 89%. Placed patient on HFFNC 35L @ 90% and patient is still satting between 88-91%. MD made aware and ABG order being put in per RT protocol. ?

## 2021-05-05 NOTE — Progress Notes (Signed)
OT Cancellation Note ? ?Patient Details ?Name: William Flores ?MRN: [id number] ?DOB: May 21, 1950 ? ? ?Cancelled Treatment:    Reason Eval/Treat Not Completed: Medical issues which prohibited therapy. Nursing reported that pt is not appropriate for therapy at this time due to increased need for supplemental O2. Will attempt to see pt at a later time when more medically stable.  ? ?Kit Mollett OT, MOT ? ? ?Larey Seat ?05/05/2021, 8:50 AM ?

## 2021-05-05 NOTE — Consult Note (Signed)
? ?NAME:  William Flores, MRN:  [id number], DOB:  1950/11/05, LOS: 1 ?ADMISSION DATE:  05/04/2021, CONSULTATION DATE:  05/05/2021 ? ?REFERRING MD:  Maylene Roes, TRH, CHIEF COMPLAINT:  resp distress  ? ?History of Present Illness:  ?71 year old man admitted from [facility] SNF on 5/2 for generalized weakness, nausea vomiting and abdominal pain.  Missed dialysis on 5/2.  ED evaluation showed no leukocytosis,  chest x-ray suggestive of patchy airspace disease bilateral concerning for multifocal pneumonia.  He was hypotensive , given 1 L of fluids.  Developed coffee-ground emesis overnight with drop in hemoglobin from 11.2-8.7, given 2 units PRBC ?CT abdomen/pelvis was obtained which showed mild pancreatic inflammation similar to prior CT in October, bladder wall thickening and marked bibasilar consolidation with pleural effusions ?He required high flow nasal cannula overnight, and PCCM consulted ?ABG was seven-point 7.24/50/123 ? ?Pertinent  Medical History  ?ESRD on HD TTS ?Liver cirrhosis-NASH ?Chronic diastolic heart failure/RV failure, echo 11/2020 D septum with moderate MR , 04/2019 RHC: mean PA 53, PCWP 23, CI 2.71 PVR 5 WU ?Atrial fibrillation on Eliquis ?Diabetes type 2 ?Hypothyroidism ?Chronic hypotension on midodrine ? ?Significant Hospital Events: ?Including procedures, antibiotic start and stop dates in addition to other pertinent events   ? ? ?Interim History / Subjective:  ? ? ?Objective   ?Blood pressure (!) 93/57, pulse 78, temperature 97.9 ?F (36.6 ?C), temperature source Oral, resp. rate 19, height 5' 4"  (1.626 m), weight 112.8 kg, SpO2 (!) 89 %. ?   ?FiO2 (%):  [45 %-90 %] 50 %  ? ?Intake/Output Summary (Last 24 hours) at 05/05/2021 1438 ?Last data filed at 05/05/2021 0740 ?Gross per 24 hour  ?Intake 961.87 ml  ?Output --  ?Net 961.87 ml  ? ?Filed Weights  ? 05/04/21 1144 05/04/21 1612 05/05/21 0500  ?Weight: 113.3 kg 113.7 kg 112.8 kg  ? ? ?Examination: ?General: Chronically ill-appearing, no distress, lying  supine on high flow nasal cannula ?HENT: Mild pallor, no icterus, no JVD ?Lungs: Decreased breath sounds bilateral crackles right base, no accessory muscle use ?Cardiovascular: S1-S2 distant, no murmur , right tunneled permacath ?Abdomen: Soft, distended, right hypochondrial tenderness, no guarding ?Extremities: 1+ edema, no deformity ?Neuro: Alert oriented x3, no asterixis ?GU: Condom catheter, no urine ? ?Resolved Hospital Problem list   ? ? ?Assessment & Plan:  ?Acute hypoxic/hypercarbic respiratory failure ?Multifocal pneumonia, resides in SNF , possible aspiration ?-Currently maintaining saturation on high flow nasal cannula, decreased flow to 30 L, maintain FiO2 50%, goal saturation 90% and above ?-Continue cefepime, plan 7 days, okay to DC Flagyl and vancomycin since MRSA PCR negative ? ?Septic shock -lactate was normal, noted chronic hypotension on midodrine ?-Okay to use low-dose Levophed to maintain MAP 60 -65 ? ?Upper GI bleed -hold anticoagulation ?-Octreotide, PPI for gastritis ?-If further bleeding may have to intubate to perform endoscopy otherwise hold off on endoscopy if stable from bleeding standpoint ?-Lipase is normal, doubt pancreatitis, pancreatic inflammation appears to be chronic ?-Elevated LFTs could be related to right heart failure versus shock liver ? ?ESRD on HD -plan is for dialysis tomorrow , can use low-dose Levophed to perform, but if persistent hypotension then may need CRRT  ? ?Chronic diastolic heart failure/RV failure ?-Hold metoprolol, sildenafil ?Atrial fibrillation -apixaban is probably not the right medication for him, doubt if he is a candidate for anticoagulation at all ? ?Palliative care is consulted to define goals of care and that is appropriate ? ? ?Best Practice (right click and "Reselect all SmartList Selections"  daily)  ? ?Diet/type: NPO ?DVT prophylaxis: SCD ?GI prophylaxis: PPI ?Lines: Dialysis Catheter ?Foley:  N/A ?Code Status:  full code ?Last date of  multidisciplinary goals of care discussion [NA] ? ?Labs   ?CBC: ?Recent Labs  ?Lab 05/04/21 ?1228 05/04/21 ?1236 05/04/21 ?2311 05/05/21 ?0750  ?WBC 5.8  --   --  4.8  ?NEUTROABS 4.2  --   --   --   ?HGB 11.2* 13.9 8.7* 10.5*  ?HCT 38.4* 41.0 31.7* 37.9*  ?MCV 110.0*  --   --  107.4*  ?PLT 127*  --   --  98*  ? ? ?Basic Metabolic Panel: ?Recent Labs  ?Lab 05/04/21 ?1228 05/04/21 ?1236 05/05/21 ?0750  ?NA 136 136 136  ?K 4.3 4.2 4.8  ?CL 102 100 103  ?CO2 25  --  19*  ?GLUCOSE 72 68* 113*  ?BUN 40* 36* 47*  ?CREATININE 6.83* 7.70* 7.19*  ?CALCIUM 8.9  --  8.4*  ?MG  --   --  2.3  ? ?GFR: ?Estimated Creatinine Clearance: 11.1 mL/min (A) (by C-G formula based on SCr of 7.19 mg/dL (H)). ?Recent Labs  ?Lab 05/04/21 ?1228 05/04/21 ?1439 05/05/21 ?0750  ?PROCALCITON  --  0.53  --   ?WBC 5.8  --  4.8  ?LATICACIDVEN 1.7  --   --   ? ? ?Liver Function Tests: ?Recent Labs  ?Lab 05/04/21 ?1228 05/05/21 ?0750  ?AST 528* 374*  ?ALT 296* 266*  ?ALKPHOS 91 77  ?BILITOT 0.9 1.7*  ?PROT 7.3 6.6  ?ALBUMIN 3.0* 3.2*  ? ?Recent Labs  ?Lab 05/04/21 ?1228 05/05/21 ?0750  ?LIPASE 19 21  ? ?No results for input(s): AMMONIA in the last 168 hours. ? ?ABG ?   ?Component Value Date/Time  ? PHART 7.24 (L) 05/05/2021 0015  ? PCO2ART 50 (H) 05/05/2021 0015  ? PO2ART 123 (H) 05/05/2021 0015  ? HCO3 21.4 05/05/2021 0015  ? TCO2 27 05/04/2021 1236  ? ACIDBASEDEF 5.9 (H) 05/05/2021 0015  ? O2SAT 99.4 05/05/2021 0015  ?  ? ?Coagulation Profile: ?Recent Labs  ?Lab 05/04/21 ?1228 05/05/21 ?0750  ?INR 2.1* 2.2*  ? ? ?Cardiac Enzymes: ?No results for input(s): CKTOTAL, CKMB, CKMBINDEX, TROPONINI in the last 168 hours. ? ?HbA1C: ?HB A1C (BAYER DCA - WAIVED)  ?Date/Time Value Ref Range Status  ?03/19/2020 01:11 PM 9.6 (H) <7.0 % Final  ?  Comment:  ?                                        Diabetic Adult            <7.0 ?                                      Healthy Adult        4.3 - 5.7 ?                                                           (DCCT/NGSP) ?American Diabetes Association's Summary of Glycemic Recommendations ?for Adults with Diabetes: Hemoglobin A1c <7.0%. More stringent ?glycemic goals (A1c <6.0%) may further reduce complications at the ?cost of  increased risk of hypoglycemia. ?  ?12/20/2019 04:15 PM 9.9 (H) <7.0 % Final  ?  Comment:  ?                                        Diabetic Adult            <7.0 ?                                      Healthy Adult        4.3 - 5.7 ?                                                          (DCCT/NGSP) ?American Diabetes Association's Summary of Glycemic Recommendations ?for Adults with Diabetes: Hemoglobin A1c <7.0%. More stringent ?glycemic goals (A1c <6.0%) may further reduce complications at the ?cost of increased risk of hypoglycemia. ?  ? ?Hgb A1c MFr Bld  ?Date/Time Value Ref Range Status  ?05/04/2021 12:28 PM 4.3 (L) 4.8 - 5.6 % Final  ?  Comment:  ?  (NOTE) ?Pre diabetes:          5.7%-6.4% ? ?Diabetes:              >6.4% ? ?Glycemic control for   <7.0% ?adults with diabetes ?  ?10/14/2020 02:39 PM 7.1 (H) 4.8 - 5.6 % Final  ?  Comment:  ?  (NOTE) ?Pre diabetes:          5.7%-6.4% ? ?Diabetes:              >6.4% ? ?Glycemic control for   <7.0% ?adults with diabetes ?  ? ? ?CBG: ?Recent Labs  ?Lab 05/04/21 ?1628 05/04/21 ?2217 05/04/21 ?2323 05/05/21 ?9562 05/05/21 ?1135  ?GLUCAP 72 65* 80 120* 122*  ? ? ?Review of Systems:   ?Complains of abdominal pain ?Vomiting blood ?Shortness of breath ?Cough ? ?Past Medical History:  ?He,  has a past medical history of Anemia, CAD (coronary artery disease), Cardiomyopathy ([place]), CHF (congestive heart failure) ([place]), Chronic atrial fibrillation ([place]), Chronic diastolic heart failure ([place]), Cirrhosis of liver ([place]), CKD (chronic kidney disease), CKD stage 3 due to type 2 diabetes mellitus ([place]) (09/17/2015), Colon cancer ([place]), Colon polyps (09/21/2012), Diabetes ([place]), GERD (gastroesophageal reflux disease), Hyperlipidemia, Hypertension,  Hypothyroidism, Insomnia, Nonalcoholic steatohepatitis, Obesity hypoventilation syndrome ([place]), Pulmonary hypertension ([place]), Renal insufficiency, Sleep apnea, and Thrombocytopenia ([place]).  ? ?Surgical History:  ? ?Past Surgical History:  ?Pr

## 2021-05-05 NOTE — Progress Notes (Signed)
PT Cancellation Note ? ?Patient Details ?Name: William Flores ?MRN: [id number] ?DOB: Sep 01, 1950 ? ? ?Cancelled Treatment:    Reason Eval/Treat Not Completed: Medical issues which prohibited therapy.  Therapy held secondary to patient having difficulty breathing per RN.  Will check back tomorrow. ? ? ?12:11 PM, 05/05/21 ?Lonell Grandchild, MPT ?Physical Therapist with [place] ?[facility] ?[phone] office ?[phone] mobile phone ? ?

## 2021-05-05 NOTE — Consult Note (Signed)
? ?                                                                                ?Consultation Note ?Date: 05/05/2021  ? ?Patient Name: William Flores  ?DOB: [birth year]  MRN: [id number]  Age / Sex: 71 y.o., male  ?PCP: [name], [name], MD ?Referring Physician: Dessa Phi, DO ? ?Reason for Consultation: Establishing goals of care ? ?HPI/Patient Profile: 71 y.o. male  with past medical history of ESRD on HD, right-sided heart failure, hypotension, [place] liver cirrhosis, chronic anemia, DM 2, A-fib on Eliquis, obesity, presenting from [facility] under LTC, admitted on 05/04/2021 with septic shock.  ?Seen by GI 5/2 for acutely elevated LFTs likely secondary to cardiogenic shock and N/V.   ?Cardiology 5/2 for chronic diastolic heart failure, fluid removal per HD but unable to dialyze due to low blood pressures.  Pulmonary hypertension medically treated.  Hypotension treated with midodrine.   ?Multiple episodes of coffee-ground emesis with a drop in hemoglobin receiving 2 units PRBC. ?Seen by nephrology 5/3 for management of dialysis.  Too unstable for HD today given hypotension, plan for HD tomorrow. ? ?Clinical Assessment and Goals of Care: ?I have reviewed medical records including EPIC notes, labs and imaging, received report from RN, assessed the patient.  [name], Flores, is lying quietly in bed.  He appears acutely/chronically ill and very frail.  He is alert and oriented x2, but tells me it is June.  I do think that he can make his basic needs known.  There is no family at bedside at this time. ? ?We briefly talk about what brought him to the hospital.  He states that he was bleeding from his gut.  We talked about his acute and chronic health concerns including, but not limited to lung dysfunction, GI issues, failed kidneys.   ?William Flores tells me that he has been on hemodialysis for about 6 months, and that he has been a resident of [facility] under long-term care for 3 to 4 months.   ?We talk about  healthcare power of attorney, he names his [place]. ?We also briefly talk about life support.  At this point he tells me that he is unsure if he would want life support. ? ?Call to stepdaughter/healthcare surrogate, Juanda Bond to discuss diagnosis prognosis, GOC, EOL wishes, disposition and options.  I introduced Palliative Medicine as specialized medical care for people living with serious illness. It focuses on providing relief from the symptoms and stress of a serious illness. The goal is to improve quality of life for both the patient and the family. ? ?We discussed a brief life review of the patient.  [name] has been a resident of [facility] under long-term care.  HD since December. LTC at [facility] since December.   Married [name]'s mother when she was 40 year old.   ? ?We then focused on their current illness. [name] is able to arcuately name Mr. Low acute health concerns.  His liver dysfunction, GI bleed, respiratory failure, heart failure, kidney failure.  She shares that William Flores's health has declined since he was hospitalized in November.  We talked about the treatment plan  and time for outcomes.  The natural disease trajectory and expectations at EOL were discussed. ? ?Advanced directives, concepts specific to code status, artifical feeding and hydration, and rehospitalization were considered and discussed.  We talk about "treat the treatable, but allowing natural passing".  I shared that William Flores stated he would not want life support but later stated he was unsure if he would want life support or not.  We talk about time for outcomes, treating.  We talk about the possibility of DNR tomorrow if no meaningful improvement after 48 hours.  We talk about limiting time on life support if it comes to that. ? ?Discussed the importance of continued conversation with family and the medical providers regarding overall plan of care and treatment options, ensuring decisions are within the context of the  patient?s values and GOCs.  Questions and concerns were addressed. The patient was encouraged to call with questions or concerns.  PMT will continue to support holistically. ? ?Conference with attending, nephrology, GI, bedside nursing staff, transition of care team related to patient condition, needs, goals of care, disposition. ? ? ?HCPOA ?HCPOA -[place].  ?  ? ?SUMMARY OF RECOMMENDATIONS   ?At this point continue to treat the treatable ?Time for outcomes ?Healthcare surrogate is considering DNR ?PMT to follow ? ? ?Code Status/Advance Care Planning: ?Full code -William Flores and I talk about CODE STATUS.  He initially states that he would not want to be on life support but later states that he is unsure.  He shares that he has not discussed this with his healthcare agent/stepdaughter, [name].  ? ?Symptom Management:  ?Per hospitalist, no additional needs at this time. ? ?Palliative Prophylaxis:  ?Frequent Pain Assessment, Oral Care, and Turn Reposition ? ?Additional Recommendations (Limitations, Scope, Preferences): ?Full Scope Treatment ? ?Psycho-social/Spiritual:  ?Desire for further Chaplaincy support:no ?Additional Recommendations: Caregiving  Support/Resources ? ?Prognosis:  ?Unable to determine, based on outcomes.  Guarded at this point.  In hospital death would not be surprising based on chronic illness burden, acuity of condition. ? ?Discharge Planning:  To be determined, based on outcomes.  Anticipate need for short-term rehab and returning to [facility] under long-term care.   ? ?  ? ?Primary Diagnoses: ?Present on Admission: ? Septic shock ([place]) ? History of colon cancer ? OSA (obstructive sleep apnea) ? Gout ? Atrial fibrillation ([place]) ? DM (diabetes mellitus), type 2 with renal complications ([place]) ? Chronic respiratory failure with hypoxia (HCC) ? Mixed hyperlipidemia ? Iron deficiency anemia ? (Resolved) Chronic renal failure ? Liver cirrhosis secondary to NASH [facility]) ? GERD  (gastroesophageal reflux disease) ? Hypotension ? Morbid obesity ([place]) ? Elevated troponin ? ? ?I have reviewed the medical record, interviewed the patient and family, and examined the patient. The following aspects are pertinent. ? ?Past Medical History:  ?Diagnosis Date  ? Anemia   ? CAD (coronary artery disease)   ? LHC 2/08:  mRCA 50%, o/w no sig CAD, EF 25%  ? Cardiomyopathy ([place])   ? EF 35-40% in 2/16 in setting of AF with RVR >> echo 5/16 with improved LVF with EF 65-70%  ? CHF (congestive heart failure) ([place])   ? Chronic atrial fibrillation (HCC)   ? Chronic diastolic heart failure ([place])   ? HFPF - EF ~65-70% (OFTEN EXACERBATED BY AFIB)  ? Cirrhosis of liver ([place])   ? CKD (chronic kidney disease)   ? hx of worsening renal failure and hyperK+ in setting of acute diast CHF >> required CVVHD  in 4/16 and dialysis x 1 in 5/16  ? CKD stage 3 due to type 2 diabetes mellitus ([place]) 09/17/2015  ? Colon cancer ([place])   ? Colon polyps 09/21/2012  ? Tubular adenoma  ? Diabetes ([place])   ? GERD (gastroesophageal reflux disease)   ? Hyperlipidemia   ? Hypertension   ? Hypothyroidism   ? Insomnia   ? Nonalcoholic steatohepatitis   ? Obesity hypoventilation syndrome ([place])   ? Pulmonary hypertension ([place])   ? Renal insufficiency   ? Sleep apnea   ? Thrombocytopenia ([place])   ? ?Social History  ? ?Socioeconomic History  ? Marital status: Divorced  ?  Spouse name: Not on file  ? Number of children: 1  ? Years of education: 23  ? Highest education level: 12th grade  ?Occupational History  ?  Employer: [employer]  ?Tobacco Use  ? Smoking status: Former  ?  Types: Cigarettes  ?  Quit date: 08/06/1983  ?  Years since quitting: 37.7  ? Smokeless tobacco: Never  ?Vaping Use  ? Vaping Use: Never used  ?Substance and Sexual Activity  ? Alcohol use: No  ? Drug use: No  ? Sexual activity: Not Currently  ?Other Topics Concern  ? Not on file  ?Social History Narrative  ? Lives alone  ? ?Social Determinants of Health  ? ?Financial Resource  Strain: Low Risk   ? Difficulty of Paying Living Expenses: Not very hard  ?Food Insecurity: No Food Insecurity  ? Worried About Charity fundraiser in the Last Year: Never true  ? Ran Out of Food in the Last Year: N

## 2021-05-05 NOTE — Telephone Encounter (Signed)
Thanks, yes patient is currently hospitalized and we were consulted yesterday ?

## 2021-05-05 NOTE — Telephone Encounter (Signed)
[facility] faxed the lab results. I have placed on Dr. Nickie Retort for review. ?

## 2021-05-05 NOTE — Progress Notes (Signed)
? ?Progress Note ? ?Patient Name: William Flores ?Date of Encounter: 05/05/2021 ? ?[facility] Cardiologist: Minus Breeding, MD  ? ?Subjective  ? ?Coffee-ground emesis overnight, hemoglobin dropped from 13.9-8.7.  Given 2 units PRBCs.  CT abdomen pelvis concerning for possible acute pancreatitis.  A.m. labs pending.  He was started on octreotide and Protonix.  BP 87/54 this morning.  Desatted overnight, started on NRB.  Most recent ABG showed 7.2 4/50/123/21.  He denies dyspnea or chest pain.  Reports feels nauseated ? ?Inpatient Medications  ?  ?Scheduled Meds: ? allopurinol  200 mg Oral Daily  ? atorvastatin  20 mg Oral QHS  ? Chlorhexidine Gluconate Cloth  6 each Topical Daily  ? ferrous sulfate  325 mg Oral Q breakfast  ? insulin aspart  0-6 Units Subcutaneous TID WC  ? insulin detemir  8 Units Subcutaneous QHS  ? lactulose  20 g Oral BID  ? levothyroxine  100 mcg Oral q morning  ? metoprolol succinate  25 mg Oral Daily  ? midodrine  10 mg Oral TID WC  ? nystatin cream   Topical BID  ? pantoprazole (PROTONIX) IV  40 mg Intravenous Q12H  ? sildenafil  20 mg Oral TID  ? sodium chloride flush  3 mL Intravenous Q12H  ? ?Continuous Infusions: ? sodium chloride    ? ceFEPime (MAXIPIME) IV    ? heparin Stopped (05/04/21 1633)  ? metronidazole 100 mL/hr at 05/05/21 0306  ? octreotide  (SANDOSTATIN)    IV infusion 50 mcg/hr (05/05/21 0306)  ? [START ON 05/06/2021] vancomycin    ? ?PRN Meds: ?sodium chloride, acetaminophen **OR** acetaminophen, hydrALAZINE, HYDROmorphone (DILAUDID) injection, ipratropium, levalbuterol, ondansetron **OR** ondansetron (ZOFRAN) IV, oxyCODONE, prochlorperazine, senna-docusate, sodium chloride flush, traMADol, traZODone  ? ?Vital Signs  ?  ?Vitals:  ? 05/05/21 0630 05/05/21 0700 05/05/21 0730 05/05/21 0740  ?BP: (!) 87/58 (!) 88/55 (!) 87/54   ?Pulse: 76 72 89 73  ?Resp: 19 (!) 22  18  ?Temp:    97.9 ?F (36.6 ?C)  ?TempSrc:    Oral  ?SpO2: 96% (!) 89% (!) 78% (!) 83%  ?Weight:      ?Height:       ? ? ?Intake/Output Summary (Last 24 hours) at 05/05/2021 0817 ?Last data filed at 05/05/2021 0740 ?Gross per 24 hour  ?Intake 961.87 ml  ?Output --  ?Net 961.87 ml  ? ? ?  05/05/2021  ?  5:00 AM 05/04/2021  ?  4:12 PM 05/04/2021  ? 11:44 AM  ?Last 3 Weights  ?Weight (lbs) 248 lb 10.9 oz 250 lb 10.6 oz 249 lb 12.5 oz  ?Weight (kg) 112.8 kg 113.7 kg 113.3 kg  ?   ? ?Telemetry  ?  ?Atrial fibrillation, rate 70s to 80s- Personally Reviewed ? ?ECG  ?  ?No new ECG- Personally Reviewed ? ?Physical Exam  ? ?GEN: No acute distress.   ?Neck: + JVD ?Cardiac: Irregular, normal rate, no murmurs, rubs, or gallops.  ?Respiratory: Diminished breath sounds ?GI: Distended, tender to palpation ?MS: No edema ?Neuro:  Nonfocal  ?Psych: Normal affect  ? ?Labs  ?  ?High Sensitivity Troponin:   ?Recent Labs  ?Lab 05/04/21 ?1228 05/04/21 ?1426  ?TROPONINIHS 417* 413*  ?   ?Chemistry ?Recent Labs  ?Lab 05/04/21 ?1228 05/04/21 ?1236  ?NA 136 136  ?K 4.3 4.2  ?CL 102 100  ?CO2 25  --   ?GLUCOSE 72 68*  ?BUN 40* 36*  ?CREATININE 6.83* 7.70*  ?CALCIUM 8.9  --   ?  PROT 7.3  --   ?ALBUMIN 3.0*  --   ?AST 528*  --   ?ALT 296*  --   ?ALKPHOS 91  --   ?BILITOT 0.9  --   ?GFRNONAA 8*  --   ?ANIONGAP 9  --   ?  ?Lipids No results for input(s): CHOL, TRIG, HDL, LABVLDL, LDLCALC, CHOLHDL in the last 168 hours.  ?Hematology ?Recent Labs  ?Lab 05/04/21 ?1228 05/04/21 ?1236 05/04/21 ?2311  ?WBC 5.8  --   --   ?RBC 3.49*  --   --   ?HGB 11.2* 13.9 8.7*  ?HCT 38.4* 41.0 31.7*  ?MCV 110.0*  --   --   ?MCH 32.1  --   --   ?MCHC 29.2*  --   --   ?RDW 25.9*  --   --   ?PLT 127*  --   --   ? ?Thyroid No results for input(s): TSH, FREET4 in the last 168 hours.  ?BNP ?Recent Labs  ?Lab 05/04/21 ?1228  ?BNP 1,371.0*  ?  ?DDimer No results for input(s): DDIMER in the last 168 hours.  ? ?Radiology  ?  ?CT ABDOMEN PELVIS WO CONTRAST ? ?Result Date: 05/05/2021 ?CLINICAL DATA:  Abdominal pain. EXAM: CT ABDOMEN AND PELVIS WITHOUT CONTRAST TECHNIQUE: Multidetector CT imaging of the  abdomen and pelvis was performed following the standard protocol without IV contrast. RADIATION DOSE REDUCTION: This exam was performed according to the departmental dose-optimization program which includes automated exposure control, adjustment of the mA and/or kV according to patient size and/or use of iterative reconstruction technique. COMPARISON:  October 14, 2020 FINDINGS: Lower chest: Marked severity areas of consolidation are seen within the bilateral lung bases. There are small bilateral pleural effusions. Hepatobiliary: Nodularity of the liver contour is again seen. No focal liver abnormality is identified. No gallstones, gallbladder wall thickening, or biliary dilatation. Pancreas: Mild peripancreatic inflammatory fat stranding is seen. This is similar in appearance to the findings noted on the prior study. There is no evidence of pancreatic ductal dilatation. Spleen: Normal in size without focal abnormality. Adrenals/Urinary Tract: Adrenal glands are unremarkable. Bilateral renal cortical thinning is seen without renal calculi or hydronephrosis. Marked severity, predominately anterior urinary bladder wall thickening is seen with moderate severity surrounding inflammatory fat stranding. Stomach/Bowel: Stomach is within normal limits. The appendix is not identified. No evidence of bowel wall thickening, distention, or inflammatory changes. Vascular/Lymphatic: Aortic atherosclerosis. No enlarged abdominal or pelvic lymph nodes. Reproductive: The prostate gland is mildly enlarged. Other: Marked severity anasarca is seen along the lateral aspects of the abdominal and pelvic walls. There is a mild amount of ascites. Musculoskeletal: A fracture deformity of indeterminate age is seen involving the L4 vertebral body. This represents a new finding when compared to the prior study. Multilevel degenerative changes are seen throughout the lumbar spine. IMPRESSION: 1. Marked severity areas of bibasilar consolidation  with small bilateral pleural effusions. 2. Bladder wall thickening which may represent sequelae associated with cystitis. Correlation with urinalysis is recommended. 3. Findings which may represent acute pancreatitis. Correlation with pancreatic enzymes is recommended. 4. Age-indeterminate fracture deformity involving the L4 vertebral body. This represents a new finding when compared to the prior study. Correlation with MRI is recommended. 5. Mild amount of ascites. 6. Aortic atherosclerosis. Aortic Atherosclerosis (ICD10-I70.0). Electronically Signed   By: Virgina Norfolk M.D.   On: 05/05/2021 01:19  ? ?DG Chest Port 1 View ? ?Result Date: 05/04/2021 ?CLINICAL DATA:  Questionable sepsis - evaluate for abnormality EXAM: PORTABLE CHEST  1 VIEW COMPARISON:  November 25, 22. FINDINGS: Patchy airspace opacities throughout the right greater than left lungs. Small bilateral pleural effusions. No visible pneumothorax on this semi upright radiograph. Enlarged cardiac silhouette. Right IJ central venous catheter with the tip projecting at the upper right atrium. Patient is rotated to the left. IMPRESSION: 1. Patchy airspace opacities throughout the right greater than left lungs, concerning for asymmetric edema and or multifocal pneumonia. Recommend imaging follow-up to resolution. 2. Cardiomegaly and pulmonary vascular congestion. 3. Small bilateral pleural effusions. Electronically Signed   By: Margaretha Sheffield M.D.   On: 05/04/2021 13:25  ? ?ECHOCARDIOGRAM COMPLETE ? ?Result Date: 05/04/2021 ?   ECHOCARDIOGRAM REPORT   Patient Name:   William Flores Date of Exam: 05/04/2021 Medical Rec #:  [id number]      Height:       64.0 in Accession #:    [id number]     Weight:       249.8 lb Date of Birth:  Mar 12, 1950      BSA:          2.150 m? Patient Age:    71 years       BP:           82/46 mmHg Patient Gender: M              HR:           78 bpm. Exam Location:  [facility] Procedure: 2D Echo, Cardiac Doppler, Color Doppler and  Intracardiac            Opacification Agent Indications:    Dyspnea R06.00  History:        Patient has prior history of Echocardiogram examinations, most                 recent 11/22/2020. Arrythmias:Atrial F

## 2021-05-05 NOTE — Progress Notes (Signed)
Turned patient's FiO2 down to 50% after ABG results. Patient going to CT scan now on NRB mask. ?

## 2021-05-05 NOTE — Progress Notes (Deleted)
ANTICOAGULATION CONSULT NOTE - Follow up Consult ? ?Pharmacy Consult for heparin gtt  ?Indication: atrial fibrillation ? ?Allergies  ?Allergen Reactions  ? Codeine   ?  Headache ?  ? Diclofenac   ? Ibuprofen   ? Indomethacin   ? Meloxicam   ? Naproxen   ? ? ?Patient Measurements: ?Height: 5' 4"  (162.6 cm) ?Weight: 112.8 kg (248 lb 10.9 oz) ?IBW/kg (Calculated) : 59.2 ?Heparin Dosing Weight: HEPARIN DW (KG): 85.9 ? ? ?Vital Signs: ?Temp: 97.9 ?F (36.6 ?C) (05/03 0740) ?Temp Source: Oral (05/03 0740) ?BP: 97/55 (05/03 3976) ?Pulse Rate: 69 (05/03 0907) ? ?Labs: ?Recent Labs  ?  05/04/21 ?1228 05/04/21 ?1236 05/04/21 ?2311 05/05/21 ?0750  ?HGB 11.2* 13.9 8.7* 10.5*  ?HCT 38.4* 41.0 31.7* 37.9*  ?PLT 127*  --   --  98*  ?APTT 51*  --   --  49*  ?LABPROT 23.1*  --   --  24.3*  ?INR 2.1*  --   --  2.2*  ?HEPARINUNFRC  --   --   --  >1.10*  ?CREATININE 6.83* 7.70*  --  7.19*  ? ? ? ?Estimated Creatinine Clearance: 11.1 mL/min (A) (by C-G formula based on SCr of 7.19 mg/dL (H)). ? ? ?Medical History: ?Past Medical History:  ?Diagnosis Date  ? Anemia   ? CAD (coronary artery disease)   ? LHC 2/08:  mRCA 50%, o/w no sig CAD, EF 25%  ? Cardiomyopathy ([place])   ? EF 35-40% in 2/16 in setting of AF with RVR >> echo 5/16 with improved LVF with EF 65-70%  ? CHF (congestive heart failure) ([place])   ? Chronic atrial fibrillation (HCC)   ? Chronic diastolic heart failure ([place])   ? HFPF - EF ~65-70% (OFTEN EXACERBATED BY AFIB)  ? Cirrhosis of liver ([place])   ? CKD (chronic kidney disease)   ? hx of worsening renal failure and hyperK+ in setting of acute diast CHF >> required CVVHD in 4/16 and dialysis x 1 in 5/16  ? CKD stage 3 due to type 2 diabetes mellitus ([place]) 09/17/2015  ? Colon cancer ([place])   ? Colon polyps 09/21/2012  ? Tubular adenoma  ? Diabetes ([place])   ? GERD (gastroesophageal reflux disease)   ? Hyperlipidemia   ? Hypertension   ? Hypothyroidism   ? Insomnia   ? Nonalcoholic steatohepatitis   ? Obesity hypoventilation syndrome  ([place])   ? Pulmonary hypertension ([place])   ? Renal insufficiency   ? Sleep apnea   ? Thrombocytopenia ([place])   ? ? ?Medications:  ?Medications Prior to Admission  ?Medication Sig Dispense Refill Last Dose  ? acetaminophen (TYLENOL) 160 MG/5ML liquid Take 650 mg by mouth every 4 (four) hours as needed for fever.   05/03/2021  ? allopurinol (ZYLOPRIM) 100 MG tablet Take 200 mg by mouth daily.   05/03/2021  ? Amino Acids-Protein Hydrolys (PRO-STAT) LIQD Take 30 mLs by mouth daily.   05/03/2021  ? apixaban (ELIQUIS) 5 MG TABS tablet Take 1 tablet (5 mg total) by mouth 2 (two) times daily. Restart on 04/29/21 60 tablet 3 05/03/2021 at 1800  ? atorvastatin (LIPITOR) 20 MG tablet TAKE 1 TABLET BY MOUTH EVERY DAY (Patient taking differently: Take 20 mg by mouth at bedtime.) 90 tablet 3 05/03/2021  ? B Complex-C (B-COMPLEX WITH VITAMIN C) tablet Take 1 tablet by mouth daily.   05/03/2021  ? Cholecalciferol (VITAMIN D) 125 MCG (5000 UT) CAPS Take 5,000 Units by mouth daily.   05/03/2021  ?  ENULOSE 10 GM/15ML SOLN Take 20 g by mouth daily.   05/03/2021  ? ferrous sulfate 325 (65 FE) MG tablet Take 325 mg by mouth daily with breakfast.   05/03/2021  ? guaiFENesin (ROBITUSSIN) 100 MG/5ML liquid Take 5 mLs by mouth every 4 (four) hours as needed for cough or to loosen phlegm.   unknown  ? insulin aspart (NOVOLOG) 100 UNIT/ML injection Before each meal 3 times a day, 140-199 - 2 units, 200-250 - 4 units, 251-299 - 6 units,  300-349 - 8 units,  350 or above 10 units. ?Insulin PEN if approved, provide syringes and needles if needed. 10 mL 0 unknown  ? insulin detemir (LEVEMIR) 100 UNIT/ML injection Inject 0.08 mLs (8 Units total) into the skin at bedtime. 10 mL 11 unknown  ? levothyroxine (SYNTHROID) 100 MCG tablet Take 100 mcg by mouth every morning.   05/03/2021  ? melatonin 5 MG TABS Take 5 mg by mouth at bedtime as needed (insomnia).   unknown  ? metoprolol succinate (TOPROL-XL) 25 MG 24 hr tablet Take 25 mg by mouth daily.   05/03/2021 at 1300  ?  midodrine (PROAMATINE) 10 MG tablet Take 1 tablet (10 mg total) by mouth 3 (three) times daily with meals. 90 tablet 3 05/03/2021  ? pantoprazole (PROTONIX) 40 MG tablet TAKE 1 TABLET BY MOUTH EVERY DAY 90 tablet 1 05/03/2021  ? polyethylene glycol (MIRALAX / GLYCOLAX) 17 g packet Take 17 g by mouth daily. 14 each 0 05/03/2021  ? sildenafil (REVATIO) 20 MG tablet Take 1 tablet (20 mg total) by mouth 3 (three) times daily. 90 tablet 11 05/03/2021  ? traMADol (ULTRAM) 50 MG tablet Take 1 tablet (50 mg total) by mouth every 6 (six) hours as needed. 10 tablet 0 05/03/2021  ? glucose blood (CONTOUR NEXT TEST) test strip Test BS TID Dx E11.29 300 strip 3   ? Insulin Pen Needle 32G X 4 MM MISC Use to give insulin daily Dx E11.29 100 each 3   ? potassium chloride SA (KLOR-CON M) 20 MEQ tablet Take 20 mEq by mouth daily.     ? ?Scheduled:  ? allopurinol  200 mg Oral Daily  ? atorvastatin  20 mg Oral QHS  ? Chlorhexidine Gluconate Cloth  6 each Topical Daily  ? ferrous sulfate  325 mg Oral Q breakfast  ? insulin aspart  0-6 Units Subcutaneous TID WC  ? lactulose  20 g Oral BID  ? levothyroxine  100 mcg Oral q morning  ? midodrine  10 mg Oral TID WC  ? nystatin cream   Topical BID  ? pantoprazole (PROTONIX) IV  40 mg Intravenous Q12H  ? sildenafil  20 mg Oral TID  ? sodium chloride flush  3 mL Intravenous Q12H  ? ?Infusions:  ? sodium chloride    ? ceFEPime (MAXIPIME) IV    ? metronidazole 100 mL/hr at 05/05/21 0306  ? norepinephrine (LEVOPHED) Adult infusion    ? octreotide  (SANDOSTATIN)    IV infusion 50 mcg/hr (05/05/21 0306)  ? [START ON 05/06/2021] vancomycin    ? ?PRN: sodium chloride, acetaminophen **OR** acetaminophen, hydrALAZINE, HYDROmorphone (DILAUDID) injection, ipratropium, levalbuterol, ondansetron **OR** ondansetron (ZOFRAN) IV, oxyCODONE, prochlorperazine, senna-docusate, sodium chloride flush, traMADol, traZODone ?Anti-infectives (From admission, onward)  ? ? Start     Dose/Rate Route Frequency Ordered Stop  ? 05/06/21  1200  vancomycin (VANCOCIN) IVPB 1000 mg/200 mL premix       ? 1,000 mg ?200 mL/hr over 60 Minutes Intravenous Every  T-Th-Sa (Hemodialysis) 05/04/21 1531    ? 05/05/21 1800  ceFEPIme (MAXIPIME) 1 g in sodium chloride 0.9 % 100 mL IVPB       ? 1 g ?200 mL/hr over 30 Minutes Intravenous Every 24 hours 05/04/21 1438    ? 05/05/21 1200  ceFEPIme (MAXIPIME) 2 g in sodium chloride 0.9 % 100 mL IVPB  Status:  Discontinued       ? 2 g ?200 mL/hr over 30 Minutes Intravenous Every 24 hours 05/04/21 1219 05/04/21 1438  ? 05/04/21 1500  vancomycin (VANCOREADY) IVPB 2000 mg/400 mL       ? 2,000 mg ?200 mL/hr over 120 Minutes Intravenous  Once 05/04/21 1428 05/04/21 1827  ? 05/04/21 1430  metroNIDAZOLE (FLAGYL) IVPB 500 mg       ? 500 mg ?100 mL/hr over 60 Minutes Intravenous Every 12 hours 05/04/21 1428 05/11/21 1429  ? 05/04/21 1200  ceFEPIme (MAXIPIME) 2 g in sodium chloride 0.9 % 100 mL IVPB       ? 2 g ?200 mL/hr over 30 Minutes Intravenous  Once 05/04/21 1155 05/04/21 1315  ? 05/04/21 1200  metroNIDAZOLE (FLAGYL) IVPB 500 mg       ? 500 mg ?100 mL/hr over 60 Minutes Intravenous  Once 05/04/21 1155 05/04/21 1422  ? ?  ? ? ?Assessment: ?William Flores a 71 y.o. male requires anticoagulation with a heparin iv infusion for the indication of  atrial fibrillation. Heparin gtt will be started following pharmacy protocol per pharmacy consult. Patient is on previous oral anticoagulant, apixaban 100m po BID last dose 5/1@1800 , that will require aPTT/HL correlation before transitioning to only HL monitoring.  ? ?HL > 1.10, not yet correlating with aPTT ?aPTT 49, subtherapeutic  ? ?Goal of Therapy:  ?Heparin level 0.3-0.7 units/ml ?aPTT 66-102 seconds ?Monitor platelets by anticoagulation protocol: Yes ?  ?Plan:  ?Give 2000 units bolus x 1 ?Increase heparin infusion to 1600 units/hr ?Check anti-Xa level/aPTT in 6 hours and daily while on heparin ?Continue to monitor H&H and platelets ? ? ?GDonna ChristenCoffee ?05/05/2021,9:25 AM ? ? ?

## 2021-05-05 NOTE — Progress Notes (Signed)
?Subjective: ?States he continues to have nausea and some abdominal pain in mid to lower abdomen. Denies any actual vomiting today but states he "spit up" some watery looking material. Denies any BMs in the past 3 days, states he has not been eating anything. Patient requesting water, states he is very thirsty.  ? ?Objective: ?Vital signs in last 24 hours: ?Temp:  [97.2 ?F (36.2 ?C)-97.9 ?F (36.6 ?C)] 97.9 ?F (36.6 ?C) (05/03 0740) ?Pulse Rate:  [60-89] 81 (05/03 1030) ?Resp:  [12-25] 24 (05/03 1030) ?BP: (73-155)/(30-140) 89/51 (05/03 1030) ?SpO2:  [78 %-100 %] 100 % (05/03 1030) ?FiO2 (%):  [45 %-90 %] 50 % (05/03 0843) ?Weight:  [112.8 kg-113.7 kg] 112.8 kg (05/03 0500) ?Last BM Date : 05/04/21 ?General:   Alert and oriented, pleasant ?Head:  Normocephalic and atraumatic. ?Eyes:  No icterus, sclera clear. Conjuctiva pink.  ?Mouth:  Without lesions, mucosa pink and moist.  ?Neck:  Supple, without thyromegaly or masses.  ?Heart:  S1, S2 present, no murmurs noted.  ?Lungs: Clear to auscultation bilaterally, without wheezing, rales, or rhonchi.  ?Abdomen:  Bowel sounds present, abdomen soft but full. Tenderness to palpation of RUQ and Epigastric region. Umbilical hernia present.  ?Msk:  Symmetrical without gross deformities. Normal posture. ?Pulses:  Normal pulses noted. ?Extremities:  Without clubbing or edema. ?Neurologic:  Alert and  oriented x4;  grossly normal neurologically. ?Skin:  Warm and dry, intact without significant lesions.  ?Psych:  Alert and cooperative. Normal mood and affect. ? ?Intake/Output from previous day: ?05/02 0701 - 05/03 0700 ?In: 671.9 [I.V.:73.8; Blood:450; IV Piggyback:148.1] ?Out: -  ?Intake/Output this shift: ?Total I/O ?In: 290 [Blood:290] ?Out: -  ? ?Lab Results: ?Recent Labs  ?  05/04/21 ?1228 05/04/21 ?1236 05/04/21 ?2311 05/05/21 ?0750  ?WBC 5.8  --   --  4.8  ?HGB 11.2* 13.9 8.7* 10.5*  ?HCT 38.4* 41.0 31.7* 37.9*  ?PLT 127*  --   --  98*  ? ?BMET ?Recent Labs  ?   05/04/21 ?1228 05/04/21 ?1236 05/05/21 ?0750  ?NA 136 136 136  ?K 4.3 4.2 4.8  ?CL 102 100 103  ?CO2 25  --  19*  ?GLUCOSE 72 68* 113*  ?BUN 40* 36* 47*  ?CREATININE 6.83* 7.70* 7.19*  ?CALCIUM 8.9  --  8.4*  ? ?LFT ?Recent Labs  ?  05/04/21 ?1228 05/05/21 ?0750  ?PROT 7.3 6.6  ?ALBUMIN 3.0* 3.2*  ?AST 528* 374*  ?ALT 296* 266*  ?ALKPHOS 91 77  ?BILITOT 0.9 1.7*  ? ?PT/INR ?Recent Labs  ?  05/04/21 ?1228 05/05/21 ?0750  ?LABPROT 23.1* 24.3*  ?INR 2.1* 2.2*  ? ? ?Assessment: ?71 year old male with history of ESRD on hemodialysis, CAD, CHF, chronic low blood pressure on outpatient midodrine, atrial fibrillation on Eliquis, [place] cirrhosis, distant history of colon cancer, chronic IDA with GI bleeding previously, and most recently with colonic dieulafoy lesion s/p APC therapy 04/25/2021, who presented to the emergency room yesterday from [facility] with reports of acute onset nausea, vomiting, generalized weakness, and left-sided abdominal pain that began yesterday, LFTs acutely elevated, GI consulted. ? ?Acute elevation of LFTs:  ?Trending down AST 374(528), ALT 266 (296), alk phos remains normal, T bili rose to 1.7 (0.9). LFTs previously normal, most recently 13 days ago. Suspect secondary to cardiogenic shock in setting of hypotension. [place] Abd complete still pending ? ?N/v/abdominal pain: acute onset Monday, continues to pass gas but with constipation. Usually has BM every other day on 20g Lactulose outpatient.  Lactulose initiated  yesterday, goal 2-3 BMs daily, should titrate as needed.  Lipase WNL yesterday and again on repeat this morning, CT A/P w/o con done last night with findings of Mild peripancreatic inflammatory fat stranding, similar in appearance to the findings noted on the prior study in Oct 2022. no evidence of pancreatic ductal dilatation. [place] Abd complete with gallbladder wall thickening without dilation of GB, query secondary to chronic hepatocellular disease. Patient with constant belching this  morning, reports this has been going on for a while. Continues with epigastric and RUQ TTP. One episode of hematemesis last night, no other episodes of vomiting. ? ?NASH Cirrhosis: MELD: 31 complicated by thrombocytopenia, ascites, requiring paracentesis in Nov 2022. No LE or signs of HE. Acutely elevated LFTs likley secondary to ischemic hepatitis in setting of severe hypotension. INR: 2.2 today, baseline appears to be around 1.4. CT A/P done last night with mild amount of ascites present, [place] Abd Complete this morning with trace ascites.  ? ?Hematemesis:  ?Notably patient had one episode of coffee ground emesis last night, Hgb dropped to 8.7 last night, up to 10.5 s/p two units PRBCs. Pt remained hypotensive, heparin dripped was stopped. Oxygen demand also increased afterwards. CT A/P w/o contrast last night did not reveal any acute processes related to hematemesis. Small Bowel Endoscopy in June 2022 without presence of esophageal varices. No further episodes of vomiting, denies rectal bleeding or melena. Patient currently on octreotide drip and Cefepime 1g for SBP prophylaxis.  ? ?Plan: ?Continue lacutlose 20g BID, titrate as needed for goal 2-3 soft BMs per day ?Continue protonix BID ?MELD labs and INR daily ?Nausea management per hospitalist ?Proceed with EGD once stable enough for procedure ?Continue octretotide infusion ?Continue cefipime 1g ?Trend H&H, transfuse for hgb <7 ? ? LOS: 1 day  ? ? 05/05/2021, 10:49 AM ? ? ?Shaneeka Scarboro L. Alver Sorrow, MSN, APRN, AGNP-C ?Adult-Gerontology Nurse Practitioner ?[facility] ? ?

## 2021-05-05 NOTE — Consult Note (Signed)
[facility] ?Renal Consultation Note  ?  ?Indication for Consultation:  Management of ESRD/hemodialysis; anemia, hypertension/volume and secondary hyperparathyroidism ? ?HPI: William Flores is a 71 y.o. male with a PMH significant for COPD, CAD, ICMP (EF 35-40%), chronic diastolic CHF, chronic atrial fibrillation on Eliquis, DM type 2, NASH cirrhosis, obesity hypoventilation syndrome, and ESRD ([facility]) who presented to [facility] ED yesterday from SNF with abdominal pain associated with N/V.  He was hypotensive at 87/50.  CXR with patchy airspace opacities throughout (R>L) and was admitted under sepsis protocol.  His hospitalization was complicated by coffee ground emesis, hypotension, and acute drop in hgb from 11.2 to 8.7.  He was transfused 2 units of PRBC's.  We were consulted to provide dialysis during his hospitalization.  ? ?Past Medical History:  ?Diagnosis Date  ? Anemia   ? CAD (coronary artery disease)   ? LHC 2/08:  mRCA 50%, o/w no sig CAD, EF 25%  ? Cardiomyopathy ([place])   ? EF 35-40% in 2/16 in setting of AF with RVR >> echo 5/16 with improved LVF with EF 65-70%  ? CHF (congestive heart failure) ([place])   ? Chronic atrial fibrillation (HCC)   ? Chronic diastolic heart failure ([place])   ? HFPF - EF ~65-70% (OFTEN EXACERBATED BY AFIB)  ? Cirrhosis of liver ([name])   ? CKD (chronic kidney disease)   ? hx of worsening renal failure and hyperK+ in setting of acute diast CHF >> required CVVHD in 4/16 and dialysis x 1 in 5/16  ? CKD stage 3 due to type 2 diabetes mellitus ([place]) 09/17/2015  ? Colon cancer ([place])   ? Colon polyps 09/21/2012  ? Tubular adenoma  ? Diabetes ([place])   ? GERD (gastroesophageal reflux disease)   ? Hyperlipidemia   ? Hypertension   ? Hypothyroidism   ? Insomnia   ? Nonalcoholic steatohepatitis   ? Obesity hypoventilation syndrome ([place])   ? Pulmonary hypertension ([place])   ? Renal insufficiency   ? Sleep apnea   ? Thrombocytopenia ([place])   ? ?Past Surgical History:  ?Procedure  Laterality Date  ? AV FISTULA PLACEMENT Right 03/02/2021  ? Procedure: RIGHT ARM ARTERIOVENOUS (AV) FISTULA CREATION;  Surgeon: [name], MD;  Location: [facility];  Service: Vascular;  Laterality: Right;  ? CIRCUMCISION    ? COLON RESECTION    ? COLONOSCOPY N/A 09/21/2012  ? Procedure: COLONOSCOPY;  Surgeon: Inda Castle, MD;  Location: [facility];  Service: Endoscopy;  Laterality: N/A;  ? COLONOSCOPY N/A 12/28/2018  ? Procedure: COLONOSCOPY;  Surgeon: Rogene Houston, MD;  Location: [facility];  Service: Endoscopy;  Laterality: N/A;  ? COLONOSCOPY WITH PROPOFOL N/A 10/17/2020  ? Procedure: COLONOSCOPY WITH PROPOFOL;  Surgeon: Harvel Quale, MD;  Location: [facility];  Service: Gastroenterology;  Laterality: N/A;  ? COLONOSCOPY WITH PROPOFOL N/A 04/25/2021  ? Procedure: COLONOSCOPY WITH PROPOFOL;  Surgeon: Rogene Houston, MD;  Location: [facility];  Service: Endoscopy;  Laterality: N/A;  ? ENTEROSCOPY N/A 06/24/2020  ? Procedure: ENTEROSCOPY;  Surgeon: Gatha Mayer, MD;  Location: [place];  Service: Endoscopy;  Laterality: N/A;  ? ESOPHAGOGASTRODUODENOSCOPY N/A 12/28/2018  ? Procedure: ESOPHAGOGASTRODUODENOSCOPY (EGD);  Surgeon: Rogene Houston, MD;  [facility];  Service: Endoscopy;  Laterality: N/A;  ? HOT HEMOSTASIS  04/25/2021  ? Procedure: HOT HEMOSTASIS (ARGON PLASMA COAGULATION/BICAP);  Surgeon: Rogene Houston, MD;  Location: [facility];  Service: Endoscopy;;  ? IR  FLUORO GUIDE CV LINE RIGHT  12/10/2020  ? IR US GUIDE VASC ACCESS RIGHT  12/11/2020  ? POLYPECTOMY  10/17/2020  ? Procedure: POLYPECTOMY;  Surgeon: Montez Morita, Quillian Quince, MD;  Location: [facility];  Service: Gastroenterology;;  rectal, transverse, descending, ascending, sigmoid  ? RIGHT HEART CATH N/A 04/11/2019  ? Procedure: RIGHT HEART CATH;  Surgeon: Larey Dresser, MD;  Location: [place] CV LAB;  Service: Cardiovascular;  Laterality: N/A;  ? US ECHOCARDIOGRAPHY  03/04/2010  ? abnormal  study  ? ?Family History:   ?Family History  ?Problem Relation Age of Onset  ? Breast cancer Mother   ? Diabetes Mother   ? Heart attack Father   ? ?Social History: ? reports that he quit smoking about 37 years ago. His smoking use included cigarettes. He has never used smokeless tobacco. He reports that he does not drink alcohol and does not use drugs. ?Allergies  ?Allergen Reactions  ? Codeine   ?  Headache ?  ? Diclofenac   ? Ibuprofen   ? Indomethacin   ? Meloxicam   ? Naproxen   ? ?Prior to Admission medications   ?Medication Sig Start Date End Date Taking? Authorizing Provider  ?acetaminophen (TYLENOL) 160 MG/5ML liquid Take 650 mg by mouth every 4 (four) hours as needed for fever.   Yes [provider]  ?allopurinol (ZYLOPRIM) 100 MG tablet Take 200 mg by mouth daily.   Yes [provider]  ?Amino Acids-Protein Hydrolys (PRO-STAT) LIQD Take 30 mLs by mouth daily.   Yes [provider]  ?apixaban (ELIQUIS) 5 MG TABS tablet Take 1 tablet (5 mg total) by mouth 2 (two) times daily. Restart on 04/29/21 04/29/21  Yes Emokpae, Courage, MD  ?atorvastatin (LIPITOR) 20 MG tablet TAKE 1 TABLET BY MOUTH EVERY DAY ?Patient taking differently: Take 20 mg by mouth at bedtime. 03/19/20  Yes [name], [name], MD  ?B Complex-C (B-COMPLEX WITH VITAMIN C) tablet Take 1 tablet by mouth daily. 12/12/20  Yes Florencia Reasons, MD  ?Cholecalciferol (VITAMIN D) 125 MCG (5000 UT) CAPS Take 5,000 Units by mouth daily.   Yes [provider]  ?ENULOSE 10 GM/15ML SOLN Take 20 g by mouth daily. 04/13/21  Yes [provider]  ?ferrous sulfate 325 (65 FE) MG tablet Take 325 mg by mouth daily with breakfast.   Yes [provider]  ?guaiFENesin (ROBITUSSIN) 100 MG/5ML liquid Take 5 mLs by mouth every 4 (four) hours as needed for cough or to loosen phlegm.   Yes [provider]  ?insulin aspart (NOVOLOG) 100 UNIT/ML injection Before each meal 3 times a day, 140-199 - 2 units, 200-250 - 4  units, 251-299 - 6 units,  300-349 - 8 units,  350 or above 10 units. ?Insulin PEN if approved, provide syringes and needles if needed. 12/11/20  Yes Florencia Reasons, MD  ?insulin detemir (LEVEMIR) 100 UNIT/ML injection Inject 0.08 mLs (8 Units total) into the skin at bedtime. 04/27/21  Yes Emokpae, Courage, MD  ?levothyroxine (SYNTHROID) 100 MCG tablet Take 100 mcg by mouth every morning.   Yes [provider]  ?melatonin 5 MG TABS Take 5 mg by mouth at bedtime as needed (insomnia).   Yes [provider]  ?metoprolol succinate (TOPROL-XL) 25 MG 24 hr tablet Take 25 mg by mouth daily. 12/25/20  Yes [provider]  ?midodrine (PROAMATINE) 10 MG tablet Take 1 tablet (10 mg total) by mouth 3 (three) times daily with meals. 04/26/21  Yes Roxan Hockey, MD  ?  pantoprazole (PROTONIX) 40 MG tablet TAKE 1 TABLET BY MOUTH EVERY DAY 06/02/20  Yes [name], [name], MD  ?polyethylene glycol (MIRALAX / GLYCOLAX) 17 g packet Take 17 g by mouth daily. 01/01/19  Yes Roxan Hockey, MD  ?sildenafil (REVATIO) 20 MG tablet Take 1 tablet (20 mg total) by mouth 3 (three) times daily. 08/24/20  Yes Larey Dresser, MD  ?traMADol (ULTRAM) 50 MG tablet Take 1 tablet (50 mg total) by mouth every 6 (six) hours as needed. 04/26/21  Yes Roxan Hockey, MD  ?glucose blood (CONTOUR NEXT TEST) test strip Test BS TID Dx E11.29 05/22/20   [name], [name], MD  ?Insulin Pen Needle 32G X 4 MM MISC Use to give insulin daily Dx E11.29 04/15/19   Charlynne Cousins, MD  ?potassium chloride SA (KLOR-CON M) 20 MEQ tablet Take 20 mEq by mouth daily.    [provider]  ?diltiazem [facility]) 240 MG 24 hr capsule TAKE 1 CAPSULE DAILY 10/19/12 02/17/13  Vernie Shanks, MD  ? ?Current Facility-Administered Medications  ?Medication Dose Route Frequency Provider Last Rate Last Admin  ? 0.9 %  sodium chloride infusion  250 mL Intravenous PRN Shahmehdi, Seyed A, MD      ? 0.9 %  sodium chloride infusion  250 mL Intravenous  Continuous Dessa Phi, DO      ? acetaminophen (TYLENOL) tablet 650 mg  650 mg Oral Q6H PRN Shahmehdi, Seyed A, MD      ? Or  ? acetaminophen (TYLENOL) suppository 650 mg  650 mg Rectal Q6H PRN Skipper Cliche

## 2021-05-06 ENCOUNTER — Inpatient Hospital Stay (HOSPITAL_COMMUNITY): Payer: Medicare Other

## 2021-05-06 DIAGNOSIS — K7200 Acute and subacute hepatic failure without coma: Secondary | ICD-10-CM | POA: Diagnosis not present

## 2021-05-06 DIAGNOSIS — A419 Sepsis, unspecified organism: Secondary | ICD-10-CM | POA: Diagnosis not present

## 2021-05-06 DIAGNOSIS — K7581 Nonalcoholic steatohepatitis (NASH): Secondary | ICD-10-CM | POA: Diagnosis not present

## 2021-05-06 DIAGNOSIS — K7460 Unspecified cirrhosis of liver: Secondary | ICD-10-CM | POA: Diagnosis not present

## 2021-05-06 DIAGNOSIS — Z515 Encounter for palliative care: Secondary | ICD-10-CM | POA: Diagnosis not present

## 2021-05-06 DIAGNOSIS — Z992 Dependence on renal dialysis: Secondary | ICD-10-CM

## 2021-05-06 DIAGNOSIS — R6521 Severe sepsis with septic shock: Secondary | ICD-10-CM | POA: Diagnosis not present

## 2021-05-06 DIAGNOSIS — J9601 Acute respiratory failure with hypoxia: Secondary | ICD-10-CM | POA: Diagnosis not present

## 2021-05-06 DIAGNOSIS — K920 Hematemesis: Secondary | ICD-10-CM | POA: Diagnosis not present

## 2021-05-06 DIAGNOSIS — Z7189 Other specified counseling: Secondary | ICD-10-CM | POA: Diagnosis not present

## 2021-05-06 DIAGNOSIS — N186 End stage renal disease: Secondary | ICD-10-CM

## 2021-05-06 LAB — GLUCOSE, CAPILLARY
Glucose-Capillary: 128 mg/dL — ABNORMAL HIGH (ref 70–99)
Glucose-Capillary: 132 mg/dL — ABNORMAL HIGH (ref 70–99)
Glucose-Capillary: 128 mg/dL — ABNORMAL HIGH (ref 70–99)
Glucose-Capillary: 125 mg/dL — ABNORMAL HIGH (ref 70–99)

## 2021-05-06 LAB — BASIC METABOLIC PANEL
Anion gap: 13 (ref 5–15)
BUN: 56 mg/dL — ABNORMAL HIGH (ref 8–23)
CO2: 18 mmol/L — ABNORMAL LOW (ref 22–32)
Calcium: 8.3 mg/dL — ABNORMAL LOW (ref 8.9–10.3)
Chloride: 105 mmol/L (ref 98–111)
Creatinine, Ser: 7.94 mg/dL — ABNORMAL HIGH (ref 0.61–1.24)
GFR, Estimated: 7 mL/min — ABNORMAL LOW (ref >60)
Glucose, Bld: 141 mg/dL — ABNORMAL HIGH (ref 70–99)
Potassium: 5.3 mmol/L — ABNORMAL HIGH (ref 3.5–5.1)
Sodium: 136 mmol/L (ref 135–145)

## 2021-05-06 LAB — CBC
HCT: 38 % — ABNORMAL LOW (ref 39.0–52.0)
Hemoglobin: 11 g/dL — ABNORMAL LOW (ref 13.0–17.0)
MCH: 30.3 pg (ref 26.0–34.0)
MCHC: 28.9 g/dL — ABNORMAL LOW (ref 30.0–36.0)
MCV: 104.7 fL — ABNORMAL HIGH (ref 80.0–100.0)
Platelets: 102 10*3/uL — ABNORMAL LOW (ref 150–400)
RBC: 3.63 MIL/uL — ABNORMAL LOW (ref 4.22–5.81)
RDW: 25.9 % — ABNORMAL HIGH (ref 11.5–15.5)
WBC: 6.5 10*3/uL (ref 4.0–10.5)
nRBC: 3.7 % — ABNORMAL HIGH (ref 0.0–0.2)

## 2021-05-06 LAB — BLOOD GAS, ARTERIAL
Acid-base deficit: 6.4 mmol/L — ABNORMAL HIGH (ref 0.0–2.0)
Bicarbonate: 22 mmol/L (ref 20.0–28.0)
Drawn by: 38235
FIO2: 50 %
O2 Saturation: 93.8 %
Patient temperature: 37
pCO2 arterial: 55 mmHg — ABNORMAL HIGH (ref 32–48)
pH, Arterial: 7.21 — ABNORMAL LOW (ref 7.35–7.45)
pO2, Arterial: 72 mmHg — ABNORMAL LOW (ref 83–108)

## 2021-05-06 LAB — TYPE AND SCREEN
ABO/RH(D): O POS
Antibody Screen: NEGATIVE
Unit division: 0
Unit division: 0

## 2021-05-06 LAB — HEPATIC FUNCTION PANEL
ALT: 185 U/L — ABNORMAL HIGH (ref 0–44)
AST: 149 U/L — ABNORMAL HIGH (ref 15–41)
Albumin: 2.9 g/dL — ABNORMAL LOW (ref 3.5–5.0)
Alkaline Phosphatase: 73 U/L (ref 38–126)
Bilirubin, Direct: 0.6 mg/dL — ABNORMAL HIGH (ref 0.0–0.2)
Indirect Bilirubin: 0.9 mg/dL (ref 0.3–0.9)
Total Bilirubin: 1.5 mg/dL — ABNORMAL HIGH (ref 0.3–1.2)
Total Protein: 6.4 g/dL — ABNORMAL LOW (ref 6.5–8.1)

## 2021-05-06 LAB — BPAM RBC
Blood Product Expiration Date: 202306012359
Blood Product Expiration Date: 202306062359
ISSUE DATE / TIME: 202305030115
ISSUE DATE / TIME: 202305030512
Unit Type and Rh: 5100
Unit Type and Rh: 5100

## 2021-05-06 LAB — AMMONIA: Ammonia: 41 umol/L — ABNORMAL HIGH (ref 9–35)

## 2021-05-06 NOTE — Consult Note (Signed)
[facility] CM Inpatient Consult ? ? ?05/06/2021 ? ?[name] ?28-Sep-1950 ?[id number] ? ?[place] [facility] CM) ?  ?Patient was reviewed for less than 30 days unplanned readmission and extreme high risk score for unplanned readmission. Assessed for post hospital chronic disease management needs. ? ?Primary Care Provider is an embedded provider with a Chronic Care Management team and program and is listed for the transition of care follow up and appointments.  ? ?Plan: Will continue to follow for progression and disposition plans.  ? ?Of note, [facility] Care Management services does not replace or interfere with any services that are arranged by inpatient case management or social work.  ? ?Netta Cedars, MSN, RN ?[facility] Liaison ?Toll free office [phone] ?

## 2021-05-06 NOTE — Progress Notes (Signed)
?   05/06/21 2233  ?Vitals  ?BP 107/67  ?Pulse Rate 88  ?Resp 14  ?During Hemodialysis Assessment  ?HD Safety Checks Performed Yes  ?Intra-Hemodialysis Comments Tx completed  ?Post-Hemodialysis Assessment  ?Rinseback Volume (mL) 250 mL  ?Dialyzer Clearance Lightly streaked  ?Duration of HD Treatment -hour(s) 3.32 hour(s) ?(Pt alert and oriented, refused to "go any longer" citing pain and needing to be cleaned up.)  ?Hemodialysis Intake (mL) 500 mL  ?UF Total -Machine (mL) 2224 mL  ?Net UF (mL) 1724 mL  ?Post-Hemodialysis Comments Tx tolerable with aid of increased levophed. Net UF removed 1724 ml. Blood returned and pt without acute distress during and post treatment.  ? ? ?

## 2021-05-06 NOTE — Progress Notes (Signed)
PT Cancellation Note ? ?Patient Details ?Name: William Flores ?MRN: [id number] ?DOB: 06-20-1950 ? ? ?Cancelled Treatment:    Reason Eval/Treat Not Completed: Medical issues which prohibited therapy.  Please reorder Physical therapy when patient is medically stable.  Thank you. ? ? ?3:42 PM, 05/06/21 ?Lonell Grandchild, MPT ?Physical Therapist with [place] ?[facility] ?[phone] office ?[phone] mobile phone ? ?

## 2021-05-06 NOTE — Plan of Care (Signed)

## 2021-05-06 NOTE — Consult Note (Signed)
? ?NAME:  William Flores, MRN:  [id number], DOB:  [birth year], LOS: 2 ?ADMISSION DATE:  05/04/2021, CONSULTATION DATE:  05/06/2021 ? ?REFERRING MD:  Maylene Roes, TRH, CHIEF COMPLAINT:  resp distress  ? ?History of Present Illness:  ?71 year old man admitted from [facility] on 5/2 for generalized weakness, nausea vomiting and abdominal pain.  Missed dialysis on 5/2.  ED evaluation showed no leukocytosis,  chest x-ray suggestive of patchy airspace disease bilateral concerning for multifocal pneumonia.  He was hypotensive , given 1 L of fluids.  Developed coffee-ground emesis overnight with drop in hemoglobin from 11.2-8.7, given 2 units PRBC ?CT abdomen/pelvis was obtained which showed mild pancreatic inflammation similar to prior CT in October, bladder wall thickening and marked bibasilar consolidation with pleural effusions ?He required high flow nasal cannula overnight, and PCCM consulted ?ABG was 7.24/50/123 ? ?Pertinent  Medical History  ?ESRD on HD TTS ?Liver cirrhosis-NASH ?Chronic diastolic heart failure/RV failure, echo 11/2020 D septum with moderate MR , 04/2019 RHC: mean PA 53, PCWP 23, CI 2.71 PVR 5 WU ?Atrial fibrillation on Eliquis ?Diabetes type 2 ?Hypothyroidism ?Chronic hypotension on midodrine ? ?Significant Hospital Events: ?Including procedures, antibiotic start and stop dates in addition to other pertinent events   ? ? ?Interim History / Subjective:  ? ?Critically ill, started on low-dose Levophed this morning. ?Remains on octreotide ?Saturation 100% on 50% / 30 L ? ?Objective   ?Blood pressure 102/62, pulse 93, temperature 97.7 ?F (36.5 ?C), temperature source Oral, resp. rate (!) 23, height 5' 4"  (1.626 m), weight 114.9 kg, SpO2 93 %. ?   ?FiO2 (%):  [45 %-50 %] 45 %  ? ?Intake/Output Summary (Last 24 hours) at 05/06/2021 1452 ?Last data filed at 05/06/2021 8413 ?Gross per 24 hour  ?Intake 1254.73 ml  ?Output --  ?Net 1254.73 ml  ? ? ?Filed Weights  ? 05/04/21 1612 05/05/21 0500 05/06/21 0353  ?Weight:  113.7 kg 112.8 kg 114.9 kg  ? ? ?Examination: ?General: Chronically ill-appearing, no distress, lying supine on high flow nasal cannula ?HENT: Mild pallor, no icterus, no JVD ?Lungs: No accessory muscle use, crackles right base, no rhonchi ?Cardiovascular: S1-S2 distant, no murmur , right tunneled permacath ?Abdomen: Soft, distended, no tenderness, guarding ?Extremities: 1+ edema, no deformity ?Neuro: Alert oriented x3, mild asterixis ?GU: Condom catheter, no urine ? ?Labs show mild hypokalemia, decreasing LFTs, stable anemia hemoglobin stable at 11, mild thrombocytopenia ? ?Chest x-ray 5/4 independently reviewed shows worsening edema and effusions left basal consolidation ? ?Resolved Hospital Problem list   ? ? ?Assessment & Plan:  ?Acute hypoxic/hypercarbic respiratory failure ?Multifocal pneumonia, resides in SNF , possible aspiration ?-Drop HFNC to 40% / 20 L, goal saturation 90% and above ?-Continue cefepime, plan 7 days, okay to DC Flagyl and vancomycin since MRSA PCR negative ? ?Septic shock -lactate was normal, noted chronic hypotension on midodrine ?-Okay to use low-dose Levophed to maintain MAP 60 -65, especially if needed for dialysis ? ?Upper GI bleed - Colonoscopy on 04/25/21 revealed bleeding colonic Dieulafoy lesion, treated with APC. ?-EGD/small bowel enteroscopy in 06/2020 did not show varices ?-Lipase is normal, doubt pancreatitis, pancreatic inflammation appears to be chronic ? ? ?-hold anticoagulation ?-Can DC octreotide in my opinion ?-q 12 PPI for gastritis ?-Hypoxia is improving and if we can decrease him to nasal cannula, can proceed with endoscopy with due risk ? ? ?-Elevated LFTs could be related to right heart failure versus shock liver , decreasing ? ?ESRD on HD -hopefully may be able to  pull 1 L with Levophed but if persistent hypotension then may need CRRT  ? ?Chronic diastolic heart failure/RV failure ?-Hold metoprolol, sildenafil ?Atrial fibrillation -apixaban is probably not the right  medication for him, doubt if he is a candidate for anticoagulation at all ? ?Palliative care input appreciated, DNR issued ? ? ?Best Practice (right click and "Reselect all SmartList Selections" daily)  ? ?Diet/type: NPO ?DVT prophylaxis: SCD ?GI prophylaxis: PPI ?Lines: Dialysis Catheter ?Foley:  N/A ?Code Status:  DNR ?Last date of multidisciplinary goals of care discussion [5/4 ] ? ?Labs   ?CBC: ?Recent Labs  ?Lab 05/04/21 ?1228 05/04/21 ?1236 05/04/21 ?2311 05/05/21 ?0750 05/06/21 ?0410  ?WBC 5.8  --   --  4.8 6.5  ?NEUTROABS 4.2  --   --   --   --   ?HGB 11.2* 13.9 8.7* 10.5* 11.0*  ?HCT 38.4* 41.0 31.7* 37.9* 38.0*  ?MCV 110.0*  --   --  107.4* 104.7*  ?PLT 127*  --   --  98* 102*  ? ? ? ?Basic Metabolic Panel: ?Recent Labs  ?Lab 05/04/21 ?1228 05/04/21 ?1236 05/05/21 ?0750 05/06/21 ?0410  ?NA 136 136 136 136  ?K 4.3 4.2 4.8 5.3*  ?CL 102 100 103 105  ?CO2 25  --  19* 18*  ?GLUCOSE 72 68* 113* 141*  ?BUN 40* 36* 47* 56*  ?CREATININE 6.83* 7.70* 7.19* 7.94*  ?CALCIUM 8.9  --  8.4* 8.3*  ?MG  --   --  2.3  --   ? ? ?GFR: ?Estimated Creatinine Clearance: 10.1 mL/min (A) (by C-G formula based on SCr of 7.94 mg/dL (H)). ?Recent Labs  ?Lab 05/04/21 ?1228 05/04/21 ?1439 05/05/21 ?0750 05/06/21 ?0410  ?PROCALCITON  --  0.53  --   --   ?WBC 5.8  --  4.8 6.5  ?LATICACIDVEN 1.7  --   --   --   ? ? ? ?Liver Function Tests: ?Recent Labs  ?Lab 05/04/21 ?1228 05/05/21 ?0750 05/06/21 ?0410  ?AST 528* 374* 149*  ?ALT 296* 266* 185*  ?ALKPHOS 91 77 73  ?BILITOT 0.9 1.7* 1.5*  ?PROT 7.3 6.6 6.4*  ?ALBUMIN 3.0* 3.2* 2.9*  ? ? ?Recent Labs  ?Lab 05/04/21 ?1228 05/05/21 ?0750  ?LIPASE 19 21  ? ? ?No results for input(s): AMMONIA in the last 168 hours. ? ?ABG ?   ?Component Value Date/Time  ? PHART 7.21 (L) 05/06/2021 0449  ? PCO2ART 55 (H) 05/06/2021 0449  ? PO2ART 72 (L) 05/06/2021 0449  ? HCO3 22.0 05/06/2021 0449  ? TCO2 27 05/04/2021 1236  ? ACIDBASEDEF 6.4 (H) 05/06/2021 0449  ? O2SAT 93.8 05/06/2021 0449  ? ?  ? ?Coagulation  Profile: ?Recent Labs  ?Lab 05/04/21 ?1228 05/05/21 ?0750  ?INR 2.1* 2.2*  ? ? ? ?Cardiac Enzymes: ?No results for input(s): CKTOTAL, CKMB, CKMBINDEX, TROPONINI in the last 168 hours. ? ?HbA1C: ?HB A1C (BAYER DCA - WAIVED)  ?Date/Time Value Ref Range Status  ?03/19/2020 01:11 PM 9.6 (H) <7.0 % Final  ?  Comment:  ?                                        Diabetic Adult            <7.0 ?  Healthy Adult        4.3 - 5.7 ?                                                          (DCCT/NGSP) ?American Diabetes Association's Summary of Glycemic Recommendations ?for Adults with Diabetes: Hemoglobin A1c <7.0%. More stringent ?glycemic goals (A1c <6.0%) may further reduce complications at the ?cost of increased risk of hypoglycemia. ?  ?12/20/2019 04:15 PM 9.9 (H) <7.0 % Final  ?  Comment:  ?                                        Diabetic Adult            <7.0 ?                                      Healthy Adult        4.3 - 5.7 ?                                                          (DCCT/NGSP) ?American Diabetes Association's Summary of Glycemic Recommendations ?for Adults with Diabetes: Hemoglobin A1c <7.0%. More stringent ?glycemic goals (A1c <6.0%) may further reduce complications at the ?cost of increased risk of hypoglycemia. ?  ? ?Hgb A1c MFr Bld  ?Date/Time Value Ref Range Status  ?05/04/2021 12:28 PM 4.3 (L) 4.8 - 5.6 % Final  ?  Comment:  ?  (NOTE) ?Pre diabetes:          5.7%-6.4% ? ?Diabetes:              >6.4% ? ?Glycemic control for   <7.0% ?adults with diabetes ?  ?10/14/2020 02:39 PM 7.1 (H) 4.8 - 5.6 % Final  ?  Comment:  ?  (NOTE) ?Pre diabetes:          5.7%-6.4% ? ?Diabetes:              >6.4% ? ?Glycemic control for   <7.0% ?adults with diabetes ?  ? ? ?CBG: ?Recent Labs  ?Lab 05/05/21 ?1135 05/05/21 ?1611 05/05/21 ?2128 05/06/21 ?0747 05/06/21 ?1149  ?GLUCAP 122* 100* 119* 128* 132*  ? ? ?Critical care time: 2 m ?  ? ?Kara Mead MD. Shade Flood. ?[facility] ?Pager : [phone] ? ?If no response to pager , please call [phone] until 7 pm ?After 7:00 pm call Elink  [phone]  ? ?05/06/2021 ? ? ? ? ?

## 2021-05-06 NOTE — Progress Notes (Signed)
?PROGRESS NOTE ? ? ? ?William Flores  AYT:[id number] DOB: 30-Sep-1950 DOA: 05/04/2021 ?PCP: [name], [name], MD  ? ?  ?Brief Narrative:  ?William Flores is a 70 year old male with multiple medical comorbidities including ESRD on HD TTS, right-sided heart failure, hypotension, hypothyroidism, [place]-liver cirrhosis, chronic anemia, cytopenia, hypokalemia, DM II, A-fib on Eliquis, obesity who presents from [facility] with complaint of nausea, vomiting, and generalized weakness that started yesterday.  Due to his symptoms, patient did not undergo dialysis as scheduled on 5/2.  In the emergency department, patient met sepsis criteria, concerning for multifocal pneumonia.  Patient was started on IV antibiotics and admitted to the hospital.  Nephrology, cardiology, pulmonology, palliative care consulted. ? ?New events last 24 hours / Subjective: ?Patient very lethargic on examination today.  He is easily arousable, alert and oriented to self, place, year.  No further vomiting reported.  ? ?Assessment & Plan: ?Principal Problem: ?  Septic shock ([place]) ?Active Problems: ?  ESRD (end stage renal disease) on dialysis [facility]) ?  Hypotension ?  Elevated troponin ?  Chronic right heart failure (HCC) ?  OSA (obstructive sleep apnea) ?  Atrial fibrillation ([place]) ?  Chronic respiratory failure with hypoxia (HCC) ?  Gout ?  History of colon cancer ?  DM (diabetes mellitus), type 2 with renal complications ([place]) ?  Mixed hyperlipidemia ?  Iron deficiency anemia ?  Liver cirrhosis secondary to NASH [facility]) ?  Morbid obesity ([place]) ?  Nonischemic cardiomyopathy ([place]) ?  Pulmonary hypertension, unspecified ([place]) ?  GERD (gastroesophageal reflux disease) ?  Nausea and vomiting ?  Pain of upper abdomen ?  Constipation ? ? ?Septic shock secondary to multifocal pneumonia ?-He does have chronic hypotension on midodrine - on hold due to NPO due to his coffee-ground emesis ?-Continue broad-spectrum antibiotics including cefepime, Flagyl ?-MRSA  PCR negative, stop vanco ?-Remains on levophed this morning ? ?Acute on chronic hypoxemic respiratory failure ?-Requires 2 L nasal cannula O2 at baseline ?-Insetting of multifocal pneumonia, missed dialysis sessions ?-Currently on HF O2, unable to wean ?-CXR showing worsening findings of CHF or fluid overload, increased moderate pleural effusions, persistent left basilar consolidation with bilateral increased opacities in the right-greater-than-left consistent with edema, ?pneumonia or combination - volume management by HD today ?-PCCM following  ? ?Hematemesis, acute blood loss anemia ?-Transfused 2 unit packed red blood cells ?-Octreotide, PPI ?-Hold Eliquis/IV heparin ?-GI following  ? ?ESRD on HD TTS ?-Nephrology following ?-HD today ? ?Chronic A-fib ?-Eliquis/IV heparin on hold due to hematemesis ?-Metoprolol on hold due to hypotension ? ?Chronic diastolic heart failure/RV failure  ?NICM ?-Volume management via hemodialysis ?-Cardiology following ? ?Pulmonary hypertension ?-Sildenafil ? ?NASH cirrhosis with elevated LFTs ?-Lactulose  ?-Per GI ? ?Nausea, vomiting, abdominal pain ?-CT abdomen pelvis revealed possible acute pancreatitis, lipase 19 ?-Per GI ? ?Diabetes mellitus type 2 with renal complications ?-T5T 4.3 ?-Discussed with diabetic coordinator, stop Levemir ?-Continue SSI ? ?Demand ischemia ?-In setting of hypotension, ESRD ?-Echocardiogram showed EF 60 to 65%, no regional wall motion abnormality, RV pressure overload with reduced RV systolic function ? ?Hypothyroidism ?-Synthroid  ? ?HLD ?-Lipitor  ? ? ?DVT prophylaxis:  ?TED hose Start: 05/04/21 1424 ?SCDs Start: 05/04/21 1424 ? ?Code Status: Full ?Family Communication: updated daughter by phone 5/4  ?Disposition Plan:  ?Status is: Inpatient ?Remains inpatient appropriate because: hemodynamically unstable  ? ?Consultants:  ?GI ?Cardiology ?Nephrology ?Palliative care ?PCCM  ? ? ?Antimicrobials:  ?Anti-infectives (From admission, onward)  ? ? Start  Dose/Rate Route Frequency Ordered Stop  ? 05/06/21 1200  vancomycin (VANCOCIN) IVPB 1000 mg/200 mL premix  Status:  Discontinued       ? 1,000 mg ?200 mL/hr over 60 Minutes Intravenous Every T-Th-Sa (Hemodialysis) 05/04/21 1531 05/05/21 1348  ? 05/05/21 1800  ceFEPIme (MAXIPIME) 1 g in sodium chloride 0.9 % 100 mL IVPB       ? 1 g ?200 mL/hr over 30 Minutes Intravenous Every 24 hours 05/04/21 1438    ? 05/05/21 1200  ceFEPIme (MAXIPIME) 2 g in sodium chloride 0.9 % 100 mL IVPB  Status:  Discontinued       ? 2 g ?200 mL/hr over 30 Minutes Intravenous Every 24 hours 05/04/21 1219 05/04/21 1438  ? 05/04/21 1500  vancomycin (VANCOREADY) IVPB 2000 mg/400 mL       ? 2,000 mg ?200 mL/hr over 120 Minutes Intravenous  Once 05/04/21 1428 05/04/21 1827  ? 05/04/21 1430  metroNIDAZOLE (FLAGYL) IVPB 500 mg       ? 500 mg ?100 mL/hr over 60 Minutes Intravenous Every 12 hours 05/04/21 1428 05/11/21 1429  ? 05/04/21 1200  ceFEPIme (MAXIPIME) 2 g in sodium chloride 0.9 % 100 mL IVPB       ? 2 g ?200 mL/hr over 30 Minutes Intravenous  Once 05/04/21 1155 05/04/21 1315  ? 05/04/21 1200  metroNIDAZOLE (FLAGYL) IVPB 500 mg       ? 500 mg ?100 mL/hr over 60 Minutes Intravenous  Once 05/04/21 1155 05/04/21 1422  ? ?  ? ? ? ?Objective: ?Vitals:  ? 05/06/21 0800 05/06/21 0900 05/06/21 1000 05/06/21 1100  ?BP: 105/60 (!) 91/43 (!) 99/57   ?Pulse:      ?Resp: 10 (!) 8 (!) 9   ?Temp: 97.8 ?F (36.6 ?C)   97.7 ?F (36.5 ?C)  ?TempSrc: Oral   Oral  ?SpO2:      ?Weight:      ?Height:      ? ? ?Intake/Output Summary (Last 24 hours) at 05/06/2021 1208 ?Last data filed at 05/06/2021 [id number] ?Gross per 24 hour  ?Intake 1254.73 ml  ?Output --  ?Net 1254.73 ml  ? ? ?Filed Weights  ? 05/04/21 1612 05/05/21 0500 05/06/21 0353  ?Weight: 113.7 kg 112.8 kg 114.9 kg  ? ? ?Examination:  ?General exam: Appears calm, very lethargic  ?Respiratory system: Clear to auscultation anteriorly, with normal respiratory effort, on HF O2  ?Cardiovascular system: S1 & S2 heard,  RRR. No murmurs.  ?Gastrointestinal system: Abdomen is mildly distended, soft ?Central nervous system: Alert and oriented x3  ?Extremities: Symmetric ?Skin: No rashes, lesions or ulcers on exposed skin  ? ?Data Reviewed: I have personally reviewed following labs and imaging studies ? ?CBC: ?Recent Labs  ?Lab 05/04/21 ?1228 05/04/21 ?1236 05/04/21 ?2311 05/05/21 ?0750 05/06/21 ?0410  ?WBC 5.8  --   --  4.8 6.5  ?NEUTROABS 4.2  --   --   --   --   ?HGB 11.2* 13.9 8.7* 10.5* 11.0*  ?HCT 38.4* 41.0 31.7* 37.9* 38.0*  ?MCV 110.0*  --   --  107.4* 104.7*  ?PLT 127*  --   --  98* 102*  ? ? ?Basic Metabolic Panel: ?Recent Labs  ?Lab 05/04/21 ?1228 05/04/21 ?1236 05/05/21 ?0750 05/06/21 ?0410  ?NA 136 136 136 136  ?K 4.3 4.2 4.8 5.3*  ?CL 102 100 103 105  ?CO2 25  --  19* 18*  ?GLUCOSE 72 68* 113* 141*  ?BUN 40* 36* 47* 56*  ?CREATININE  6.83* 7.70* 7.19* 7.94*  ?CALCIUM 8.9  --  8.4* 8.3*  ?MG  --   --  2.3  --   ? ? ?GFR: ?Estimated Creatinine Clearance: 10.1 mL/min (A) (by C-G formula based on SCr of 7.94 mg/dL (H)). ?Liver Function Tests: ?Recent Labs  ?Lab 05/04/21 ?1228 05/05/21 ?0750 05/06/21 ?0410  ?AST 528* 374* 149*  ?ALT 296* 266* 185*  ?ALKPHOS 91 77 73  ?BILITOT 0.9 1.7* 1.5*  ?PROT 7.3 6.6 6.4*  ?ALBUMIN 3.0* 3.2* 2.9*  ? ? ?Recent Labs  ?Lab 05/04/21 ?1228 05/05/21 ?0750  ?LIPASE 19 21  ? ? ?No results for input(s): AMMONIA in the last 168 hours. ?Coagulation Profile: ?Recent Labs  ?Lab 05/04/21 ?1228 05/05/21 ?0750  ?INR 2.1* 2.2*  ? ? ?Cardiac Enzymes: ?No results for input(s): CKTOTAL, CKMB, CKMBINDEX, TROPONINI in the last 168 hours. ?BNP (last 3 results) ?No results for input(s): PROBNP in the last 8760 hours. ?HbA1C: ?Recent Labs  ?  05/04/21 ?1228  ?HGBA1C 4.3*  ? ? ?CBG: ?Recent Labs  ?Lab 05/05/21 ?1135 05/05/21 ?1611 05/05/21 ?2128 05/06/21 ?0747 05/06/21 ?1149  ?GLUCAP 122* 100* 119* 128* 132*  ? ? ?Lipid Profile: ?No results for input(s): CHOL, HDL, LDLCALC, TRIG, CHOLHDL, LDLDIRECT in the last 72  hours. ?Thyroid Function Tests: ?No results for input(s): TSH, T4TOTAL, FREET4, T3FREE, THYROIDAB in the last 72 hours. ?Anemia Panel: ?No results for input(s): VITAMINB12, FOLATE, FERRITIN, TIBC, IRON, R

## 2021-05-06 NOTE — Progress Notes (Signed)
? ?Progress Note ? ?Patient Name: William Flores ?Date of Encounter: 05/06/2021 ? ?[facility] Cardiologist: Minus Breeding, MD  ? ?Subjective  ? ?Fatigued this AM ? ?Inpatient Medications  ?  ?Scheduled Meds: ? allopurinol  200 mg Oral Daily  ? atorvastatin  20 mg Oral QHS  ? Chlorhexidine Gluconate Cloth  6 each Topical Daily  ? Chlorhexidine Gluconate Cloth  6 each Topical Q0600  ? ferrous sulfate  325 mg Oral Q breakfast  ? insulin aspart  0-6 Units Subcutaneous TID WC  ? lactulose  20 g Oral BID  ? levothyroxine  100 mcg Oral q morning  ? midodrine  10 mg Oral TID WC  ? nystatin cream   Topical BID  ? pantoprazole (PROTONIX) IV  40 mg Intravenous Q12H  ? sildenafil  20 mg Oral TID  ? sodium chloride flush  3 mL Intravenous Q12H  ? ?Continuous Infusions: ? sodium chloride    ? sodium chloride    ? ceFEPime (MAXIPIME) IV 1 g (05/05/21 1742)  ? metronidazole 500 mg (05/06/21 0137)  ? norepinephrine (LEVOPHED) Adult infusion 5 mcg/min (05/06/21 0740)  ? octreotide  (SANDOSTATIN)    IV infusion 50 mcg/hr (05/06/21 0740)  ? ?PRN Meds: ?sodium chloride, acetaminophen **OR** acetaminophen, hydrALAZINE, HYDROmorphone (DILAUDID) injection, ipratropium, levalbuterol, ondansetron **OR** ondansetron (ZOFRAN) IV, oxyCODONE, prochlorperazine, senna-docusate, sodium chloride flush, traMADol, traZODone  ? ?Vital Signs  ?  ?Vitals:  ? 05/06/21 0545 05/06/21 0600 05/06/21 0615 05/06/21 0700  ?BP: (!) 94/51 96/60 106/65 104/60  ?Pulse:      ?Resp: 11 10 (!) 9 (!) 8  ?Temp:      ?TempSrc:      ?SpO2:      ?Weight:      ?Height:      ? ? ?Intake/Output Summary (Last 24 hours) at 05/06/2021 0758 ?Last data filed at 05/06/2021 0740 ?Gross per 24 hour  ?Intake 849.62 ml  ?Output --  ?Net 849.62 ml  ? ? ?  05/06/2021  ?  3:53 AM 05/05/2021  ?  5:00 AM 05/04/2021  ?  4:12 PM  ?Last 3 Weights  ?Weight (lbs) 253 lb 4.9 oz 248 lb 10.9 oz 250 lb 10.6 oz  ?Weight (kg) 114.9 kg 112.8 kg 113.7 kg  ?   ? ?Telemetry  ?  ?SR - Personally Reviewed ? ?ECG   ?  ?N/a - Personally Reviewed ? ?Physical Exam  ? ?GEN: fatigued this AM ?Neck: No JVD ?Cardiac: irreg ?Respiratory: coarse bilaterally ?GI: Soft, nontender, non-distended  ?MS: No edema; No deformity. ?Neuro:  Nonfocal  ?Psych: Normal affect  ? ?Labs  ?  ?High Sensitivity Troponin:   ?Recent Labs  ?Lab 05/04/21 ?1228 05/04/21 ?1426  ?TROPONINIHS 417* 413*  ?   ?Chemistry ?Recent Labs  ?Lab 05/04/21 ?1228 05/04/21 ?1236 05/05/21 ?0750 05/06/21 ?0410  ?NA 136 136 136 136  ?K 4.3 4.2 4.8 5.3*  ?CL 102 100 103 105  ?CO2 25  --  19* 18*  ?GLUCOSE 72 68* 113* 141*  ?BUN 40* 36* 47* 56*  ?CREATININE 6.83* 7.70* 7.19* 7.94*  ?CALCIUM 8.9  --  8.4* 8.3*  ?MG  --   --  2.3  --   ?PROT 7.3  --  6.6 6.4*  ?ALBUMIN 3.0*  --  3.2* 2.9*  ?AST 528*  --  374* 149*  ?ALT 296*  --  266* 185*  ?ALKPHOS 91  --  77 73  ?BILITOT 0.9  --  1.7* 1.5*  ?GFRNONAA 8*  --  8* 7*  ?ANIONGAP 9  --  14 13  ?  ?Lipids No results for input(s): CHOL, TRIG, HDL, LABVLDL, LDLCALC, CHOLHDL in the last 168 hours.  ?Hematology ?Recent Labs  ?Lab 05/04/21 ?1228 05/04/21 ?1236 05/04/21 ?2311 05/05/21 ?0750 05/06/21 ?0410  ?WBC 5.8  --   --  4.8 6.5  ?RBC 3.49*  --   --  3.53* 3.63*  ?HGB 11.2*   < > 8.7* 10.5* 11.0*  ?HCT 38.4*   < > 31.7* 37.9* 38.0*  ?MCV 110.0*  --   --  107.4* 104.7*  ?MCH 32.1  --   --  29.7 30.3  ?MCHC 29.2*  --   --  27.7* 28.9*  ?RDW 25.9*  --   --  26.3* 25.9*  ?PLT 127*  --   --  98* 102*  ? < > = values in this interval not displayed.  ? ?Thyroid No results for input(s): TSH, FREET4 in the last 168 hours.  ?BNP ?Recent Labs  ?Lab 05/04/21 ?1228  ?BNP 1,371.0*  ?  ?DDimer No results for input(s): DDIMER in the last 168 hours.  ? ?Radiology  ?  ?CT ABDOMEN PELVIS WO CONTRAST ? ?Result Date: 05/05/2021 ?CLINICAL DATA:  Abdominal pain. EXAM: CT ABDOMEN AND PELVIS WITHOUT CONTRAST TECHNIQUE: Multidetector CT imaging of the abdomen and pelvis was performed following the standard protocol without IV contrast. RADIATION DOSE REDUCTION:  This exam was performed according to the departmental dose-optimization program which includes automated exposure control, adjustment of the mA and/or kV according to patient size and/or use of iterative reconstruction technique. COMPARISON:  October 14, 2020 FINDINGS: Lower chest: Marked severity areas of consolidation are seen within the bilateral lung bases. There are small bilateral pleural effusions. Hepatobiliary: Nodularity of the liver contour is again seen. No focal liver abnormality is identified. No gallstones, gallbladder wall thickening, or biliary dilatation. Pancreas: Mild peripancreatic inflammatory fat stranding is seen. This is similar in appearance to the findings noted on the prior study. There is no evidence of pancreatic ductal dilatation. Spleen: Normal in size without focal abnormality. Adrenals/Urinary Tract: Adrenal glands are unremarkable. Bilateral renal cortical thinning is seen without renal calculi or hydronephrosis. Marked severity, predominately anterior urinary bladder wall thickening is seen with moderate severity surrounding inflammatory fat stranding. Stomach/Bowel: Stomach is within normal limits. The appendix is not identified. No evidence of bowel wall thickening, distention, or inflammatory changes. Vascular/Lymphatic: Aortic atherosclerosis. No enlarged abdominal or pelvic lymph nodes. Reproductive: The prostate gland is mildly enlarged. Other: Marked severity anasarca is seen along the lateral aspects of the abdominal and pelvic walls. There is a mild amount of ascites. Musculoskeletal: A fracture deformity of indeterminate age is seen involving the L4 vertebral body. This represents a new finding when compared to the prior study. Multilevel degenerative changes are seen throughout the lumbar spine. IMPRESSION: 1. Marked severity areas of bibasilar consolidation with small bilateral pleural effusions. 2. Bladder wall thickening which may represent sequelae associated with  cystitis. Correlation with urinalysis is recommended. 3. Findings which may represent acute pancreatitis. Correlation with pancreatic enzymes is recommended. 4. Age-indeterminate fracture deformity involving the L4 vertebral body. This represents a new finding when compared to the prior study. Correlation with MRI is recommended. 5. Mild amount of ascites. 6. Aortic atherosclerosis. Aortic Atherosclerosis (ICD10-I70.0). Electronically Signed   By: Virgina Norfolk M.D.   On: 05/05/2021 01:19  ? ?US Abdomen Complete ? ?Result Date: 05/05/2021 ?CLINICAL DATA:  Abnormal LFTs. EXAM: ABDOMEN ULTRASOUND COMPLETE COMPARISON:  Same  day CT of the abdomen and pelvis. FINDINGS: Gallbladder: Gallbladder wall thickening measuring up to 4 mm without dilation of the gallbladder may reflect sequela of chronic hepatocellular disease. No sonographic Murphy sign noted by sonographer. Common bile duct: Not identified Liver: No focal lesion identified. Hepatomegaly with nodular hepatic contour. Portal vein is patent on color Doppler imaging with normal direction of blood flow towards the liver. IVC: No abnormality visualized. Pancreas: Visualized portion unremarkable. Spleen: Size and appearance within normal limits. Right Kidney: Length: 11.1 cm. Echogenicity within normal limits. Nonobstructive renal calculi measure up to 8 mm. No mass or hydronephrosis visualized. Left Kidney: Not visualized, obscured by bowel gas and patient habitus Abdominal aorta: Not well visualized. Other findings: Trace ascites. IMPRESSION: 1. Hepatomegaly with nodular hepatic contour suggestive of cirrhosis. No focal liver lesion identified. 2. Gallbladder wall thickening without dilation of the gallbladder likely reflects sequela of chronic hepatocellular disease. 3. Nonobstructive right renal calculi. 4. Trace ascites. Electronically Signed   By: Dahlia Bailiff M.D.   On: 05/05/2021 11:29  ? ?DG CHEST PORT 1 VIEW ? ?Result Date: 05/06/2021 ?CLINICAL DATA:   Shortness of breath.  ESRD.  Septic shock. EXAM: PORTABLE CHEST 1 VIEW COMPARISON:  05/04/2021 FINDINGS: 4:53 a.m., 05/06/2021. Right IJ dialysis catheter tip remains in the right atrium. The heart is enlarge

## 2021-05-06 NOTE — Progress Notes (Signed)
Patient off Heated HFNC at this time. Doing well on salter HFNC at 10 LPM. Patient currently receiving dialysis. ?

## 2021-05-06 NOTE — Progress Notes (Signed)
Patient ID: William Flores, male   DOB: 21-Jan-1950, 71 y.o.   MRN: [id number] ?S: Lethargic this morning. ?O:BP (!) 99/57   Pulse 93   Temp 97.8 ?F (36.6 ?C) (Oral)   Resp (!) 9   Ht 5' 4"  (1.626 m)   Wt 114.9 kg   SpO2 93%   BMI 43.48 kg/m?  ? ?Intake/Output Summary (Last 24 hours) at 05/06/2021 1011 ?Last data filed at 05/06/2021 2549 ?Gross per 24 hour  ?Intake 1254.73 ml  ?Output --  ?Net 1254.73 ml  ? ?Intake/Output: ?I/O last 3 completed shifts: ?In: 884.3 [I.V.:73.8; Blood:740; IV Piggyback:70.5] ?Out: -  ? Intake/Output this shift: ? Total I/O ?In: 1254.7 [I.V.:939; IV Piggyback:315.8] ?Out: -  ?Weight change: 1.6 kg ?Gen:NAD ?CVS:IRR IRR ?Resp: decreased BS at bases ?Abd:+BS, soft, + tenderness to palpation at epigastric region, no guarding/rebound, + distended/obese ?Ext:2+ pre sacral edema, RUE AVF faint thrill and bruit ? ?Recent Labs  ?Lab 05/04/21 ?1228 05/04/21 ?1236 05/05/21 ?0750 05/06/21 ?0410  ?NA 136 136 136 136  ?K 4.3 4.2 4.8 5.3*  ?CL 102 100 103 105  ?CO2 25  --  19* 18*  ?GLUCOSE 72 68* 113* 141*  ?BUN 40* 36* 47* 56*  ?CREATININE 6.83* 7.70* 7.19* 7.94*  ?ALBUMIN 3.0*  --  3.2* 2.9*  ?CALCIUM 8.9  --  8.4* 8.3*  ?AST 528*  --  374* 149*  ?ALT 296*  --  266* 185*  ? ?Liver Function Tests: ?Recent Labs  ?Lab 05/04/21 ?1228 05/05/21 ?0750 05/06/21 ?0410  ?AST 528* 374* 149*  ?ALT 296* 266* 185*  ?ALKPHOS 91 77 73  ?BILITOT 0.9 1.7* 1.5*  ?PROT 7.3 6.6 6.4*  ?ALBUMIN 3.0* 3.2* 2.9*  ? ?Recent Labs  ?Lab 05/04/21 ?1228 05/05/21 ?0750  ?LIPASE 19 21  ? ?No results for input(s): AMMONIA in the last 168 hours. ?CBC: ?Recent Labs  ?Lab 05/04/21 ?1228 05/04/21 ?1236 05/04/21 ?2311 05/05/21 ?0750 05/06/21 ?0410  ?WBC 5.8  --   --  4.8 6.5  ?NEUTROABS 4.2  --   --   --   --   ?HGB 11.2*   < > 8.7* 10.5* 11.0*  ?HCT 38.4*   < > 31.7* 37.9* 38.0*  ?MCV 110.0*  --   --  107.4* 104.7*  ?PLT 127*  --   --  98* 102*  ? < > = values in this interval not displayed.  ? ?Cardiac Enzymes: ?No results for  input(s): CKTOTAL, CKMB, CKMBINDEX, TROPONINI in the last 168 hours. ?CBG: ?Recent Labs  ?Lab 05/05/21 ?0733 05/05/21 ?1135 05/05/21 ?1611 05/05/21 ?2128 05/06/21 ?0747  ?GLUCAP 120* 122* 100* 119* 128*  ? ? ?Iron Studies: No results for input(s): IRON, TIBC, TRANSFERRIN, FERRITIN in the last 72 hours. ?Studies/Results: ?CT ABDOMEN PELVIS WO CONTRAST ? ?Result Date: 05/05/2021 ?CLINICAL DATA:  Abdominal pain. EXAM: CT ABDOMEN AND PELVIS WITHOUT CONTRAST TECHNIQUE: Multidetector CT imaging of the abdomen and pelvis was performed following the standard protocol without IV contrast. RADIATION DOSE REDUCTION: This exam was performed according to the departmental dose-optimization program which includes automated exposure control, adjustment of the mA and/or kV according to patient size and/or use of iterative reconstruction technique. COMPARISON:  October 14, 2020 FINDINGS: Lower chest: Marked severity areas of consolidation are seen within the bilateral lung bases. There are small bilateral pleural effusions. Hepatobiliary: Nodularity of the liver contour is again seen. No focal liver abnormality is identified. No gallstones, gallbladder wall thickening, or biliary dilatation. Pancreas: Mild peripancreatic inflammatory fat stranding  is seen. This is similar in appearance to the findings noted on the prior study. There is no evidence of pancreatic ductal dilatation. Spleen: Normal in size without focal abnormality. Adrenals/Urinary Tract: Adrenal glands are unremarkable. Bilateral renal cortical thinning is seen without renal calculi or hydronephrosis. Marked severity, predominately anterior urinary bladder wall thickening is seen with moderate severity surrounding inflammatory fat stranding. Stomach/Bowel: Stomach is within normal limits. The appendix is not identified. No evidence of bowel wall thickening, distention, or inflammatory changes. Vascular/Lymphatic: Aortic atherosclerosis. No enlarged abdominal or pelvic  lymph nodes. Reproductive: The prostate gland is mildly enlarged. Other: Marked severity anasarca is seen along the lateral aspects of the abdominal and pelvic walls. There is a mild amount of ascites. Musculoskeletal: A fracture deformity of indeterminate age is seen involving the L4 vertebral body. This represents a new finding when compared to the prior study. Multilevel degenerative changes are seen throughout the lumbar spine. IMPRESSION: 1. Marked severity areas of bibasilar consolidation with small bilateral pleural effusions. 2. Bladder wall thickening which may represent sequelae associated with cystitis. Correlation with urinalysis is recommended. 3. Findings which may represent acute pancreatitis. Correlation with pancreatic enzymes is recommended. 4. Age-indeterminate fracture deformity involving the L4 vertebral body. This represents a new finding when compared to the prior study. Correlation with MRI is recommended. 5. Mild amount of ascites. 6. Aortic atherosclerosis. Aortic Atherosclerosis (ICD10-I70.0). Electronically Signed   By: Virgina Norfolk M.D.   On: 05/05/2021 01:19  ? ?US Abdomen Complete ? ?Result Date: 05/05/2021 ?CLINICAL DATA:  Abnormal LFTs. EXAM: ABDOMEN ULTRASOUND COMPLETE COMPARISON:  Same day CT of the abdomen and pelvis. FINDINGS: Gallbladder: Gallbladder wall thickening measuring up to 4 mm without dilation of the gallbladder may reflect sequela of chronic hepatocellular disease. No sonographic Murphy sign noted by sonographer. Common bile duct: Not identified Liver: No focal lesion identified. Hepatomegaly with nodular hepatic contour. Portal vein is patent on color Doppler imaging with normal direction of blood flow towards the liver. IVC: No abnormality visualized. Pancreas: Visualized portion unremarkable. Spleen: Size and appearance within normal limits. Right Kidney: Length: 11.1 cm. Echogenicity within normal limits. Nonobstructive renal calculi measure up to 8 mm. No mass  or hydronephrosis visualized. Left Kidney: Not visualized, obscured by bowel gas and patient habitus Abdominal aorta: Not well visualized. Other findings: Trace ascites. IMPRESSION: 1. Hepatomegaly with nodular hepatic contour suggestive of cirrhosis. No focal liver lesion identified. 2. Gallbladder wall thickening without dilation of the gallbladder likely reflects sequela of chronic hepatocellular disease. 3. Nonobstructive right renal calculi. 4. Trace ascites. Electronically Signed   By: Dahlia Bailiff M.D.   On: 05/05/2021 11:29  ? ?DG CHEST PORT 1 VIEW ? ?Result Date: 05/06/2021 ?CLINICAL DATA:  Shortness of breath.  ESRD.  Septic shock. EXAM: PORTABLE CHEST 1 VIEW COMPARISON:  05/04/2021 FINDINGS: 4:53 a.m., 05/06/2021. Right IJ dialysis catheter tip remains in the right atrium. The heart is enlarged. There is aortic atherosclerosis. Stable mediastinum with widening superiorly. There is interval worsening perihilar vascular engorgement and flow cephalization, and worsening interstitial edema in the right-greater-than-left lungs, moderate pleural effusions both slightly increased. There are perihilar opacities on the right-greater-than-left also increased consistent with edema, pneumonia or combination with dense left lower lobe consolidation again shown. There is osteopenia with thoracic cage grossly intact. In all other respects no further changes. IMPRESSION: 1. Worsening findings of CHF or fluid overload. 2. Increased moderate pleural effusions. 3. Persistent left basilar consolidation with bilateral increased opacities in the right-greater-than-left consistent with edema,  pneumonia or combination. 4. Aortic atherosclerosis. Electronically Signed   By: Telford Nab M.D.   On: 05/06/2021 06:08  ? ?DG Chest Port 1 View ? ?Result Date: 05/04/2021 ?CLINICAL DATA:  Questionable sepsis - evaluate for abnormality EXAM: PORTABLE CHEST 1 VIEW COMPARISON:  November 25, 22. FINDINGS: Patchy airspace opacities  throughout the right greater than left lungs. Small bilateral pleural effusions. No visible pneumothorax on this semi upright radiograph. Enlarged cardiac silhouette. Right IJ central venous catheter with the tip projecting

## 2021-05-06 NOTE — Progress Notes (Signed)
? ?Gastroenterology Progress Note  ? ?Referring Provider: No ref. provider found ?Primary Care Physician:  [name], [name], MD ?Primary Gastroenterologist:  Susanne Greenhouse ? ?Patient ID: William Flores; [id number]; 09-19-1950  ? ?Subjective:   ? ?Awake but lethargic. Denies abdominal pain. No BM in 24 hours days. Per nursing staff he has had required HFNC 30L/min FiO2 50% to maintain O2sat. Have not been able to wean off levophed and currently NPO due to respiratory status. Not received oral medications. Lactulose last given yesterday evening. Answers questions appropriate but per nursing staff he does not seem quite a baseline. ? ?Objective:  ? ?Vital signs in last 24 hours: ?Temp:  [97.8 ?F (36.6 ?C)-98.1 ?F (36.7 ?C)] 97.8 ?F (36.6 ?C) (05/04 0800) ?Pulse Rate:  [47-93] 93 (05/03 2145) ?Resp:  [8-26] 10 (05/04 0800) ?BP: (62-152)/(30-137) 105/60 (05/04 0800) ?SpO2:  [77 %-100 %] 93 % (05/04 0236) ?FiO2 (%):  [50 %] 50 % (05/04 0345) ?Weight:  [114.9 kg] 114.9 kg (05/04 0353) ?Last BM Date : 05/04/21 ?General:   lethargic but easily arouses. Alert and orient to person and place, did not access time. Limited answers to questions but appropriate. Acutely ill appearing.  ?Head:  Normocephalic and atraumatic. ?Eyes:  Sclera clear, no icterus.  ?Abdomen:  Soft, nontender and nondistended.hypoactive BS. No guarding.  ?Extremities:  Without clubbing, deformity or edema. ?Neurologic:  Alert and  oriented X2. Did not assess time.  ?Skin:  Intact without significant lesions or rashes. ?Psych:  Alert and cooperative. Normal mood and affect. ? ?Intake/Output from previous day: ?05/03 0701 - 05/04 0700 ?In: 290 [Blood:290] ?Out: -  ?Intake/Output this shift: ?Total I/O ?In: 849.6 [I.V.:849.6] ?Out: -  ? ?Lab Results: ?CBC ?Recent Labs  ?  05/04/21 ?1228 05/04/21 ?1236 05/04/21 ?2311 05/05/21 ?0750 05/06/21 ?0410  ?WBC 5.8  --   --  4.8 6.5  ?HGB 11.2*   < > 8.7* 10.5* 11.0*  ?HCT 38.4*   < > 31.7* 37.9* 38.0*  ?MCV 110.0*  --    --  107.4* 104.7*  ?PLT 127*  --   --  98* 102*  ? < > = values in this interval not displayed.  ? ?BMET ?Recent Labs  ?  05/04/21 ?1228 05/04/21 ?1236 05/05/21 ?0750 05/06/21 ?0410  ?NA 136 136 136 136  ?K 4.3 4.2 4.8 5.3*  ?CL 102 100 103 105  ?CO2 25  --  19* 18*  ?GLUCOSE 72 68* 113* 141*  ?BUN 40* 36* 47* 56*  ?CREATININE 6.83* 7.70* 7.19* 7.94*  ?CALCIUM 8.9  --  8.4* 8.3*  ? ?LFTs ?Recent Labs  ?  05/04/21 ?1228 05/05/21 ?0750 05/06/21 ?0410  ?BILITOT 0.9 1.7* 1.5*  ?BILIDIR  --   --  0.6*  ?IBILI  --   --  0.9  ?ALKPHOS 91 77 73  ?AST 528* 374* 149*  ?ALT 296* 266* 185*  ?PROT 7.3 6.6 6.4*  ?ALBUMIN 3.0* 3.2* 2.9*  ? ?Recent Labs  ?  05/04/21 ?1228 05/05/21 ?0750  ?LIPASE 19 21  ? ?PT/INR ?Recent Labs  ?  05/04/21 ?1228 05/05/21 ?0750  ?LABPROT 23.1* 24.3*  ?INR 2.1* 2.2*  ?  ? ?Imaging Studies: ?CT ABDOMEN PELVIS WO CONTRAST ? ?Result Date: 05/05/2021 ?CLINICAL DATA:  Abdominal pain. EXAM: CT ABDOMEN AND PELVIS WITHOUT CONTRAST TECHNIQUE: Multidetector CT imaging of the abdomen and pelvis was performed following the standard protocol without IV contrast. RADIATION DOSE REDUCTION: This exam was performed according to the departmental dose-optimization program which includes automated exposure  control, adjustment of the mA and/or kV according to patient size and/or use of iterative reconstruction technique. COMPARISON:  October 14, 2020 FINDINGS: Lower chest: Marked severity areas of consolidation are seen within the bilateral lung bases. There are small bilateral pleural effusions. Hepatobiliary: Nodularity of the liver contour is again seen. No focal liver abnormality is identified. No gallstones, gallbladder wall thickening, or biliary dilatation. Pancreas: Mild peripancreatic inflammatory fat stranding is seen. This is similar in appearance to the findings noted on the prior study. There is no evidence of pancreatic ductal dilatation. Spleen: Normal in size without focal abnormality. Adrenals/Urinary  Tract: Adrenal glands are unremarkable. Bilateral renal cortical thinning is seen without renal calculi or hydronephrosis. Marked severity, predominately anterior urinary bladder wall thickening is seen with moderate severity surrounding inflammatory fat stranding. Stomach/Bowel: Stomach is within normal limits. The appendix is not identified. No evidence of bowel wall thickening, distention, or inflammatory changes. Vascular/Lymphatic: Aortic atherosclerosis. No enlarged abdominal or pelvic lymph nodes. Reproductive: The prostate gland is mildly enlarged. Other: Marked severity anasarca is seen along the lateral aspects of the abdominal and pelvic walls. There is a mild amount of ascites. Musculoskeletal: A fracture deformity of indeterminate age is seen involving the L4 vertebral body. This represents a new finding when compared to the prior study. Multilevel degenerative changes are seen throughout the lumbar spine. IMPRESSION: 1. Marked severity areas of bibasilar consolidation with small bilateral pleural effusions. 2. Bladder wall thickening which may represent sequelae associated with cystitis. Correlation with urinalysis is recommended. 3. Findings which may represent acute pancreatitis. Correlation with pancreatic enzymes is recommended. 4. Age-indeterminate fracture deformity involving the L4 vertebral body. This represents a new finding when compared to the prior study. Correlation with MRI is recommended. 5. Mild amount of ascites. 6. Aortic atherosclerosis. Aortic Atherosclerosis (ICD10-I70.0). Electronically Signed   By: Virgina Norfolk M.D.   On: 05/05/2021 01:19  ? ?US Abdomen Complete ? ?Result Date: 05/05/2021 ?CLINICAL DATA:  Abnormal LFTs. EXAM: ABDOMEN ULTRASOUND COMPLETE COMPARISON:  Same day CT of the abdomen and pelvis. FINDINGS: Gallbladder: Gallbladder wall thickening measuring up to 4 mm without dilation of the gallbladder may reflect sequela of chronic hepatocellular disease. No  sonographic Murphy sign noted by sonographer. Common bile duct: Not identified Liver: No focal lesion identified. Hepatomegaly with nodular hepatic contour. Portal vein is patent on color Doppler imaging with normal direction of blood flow towards the liver. IVC: No abnormality visualized. Pancreas: Visualized portion unremarkable. Spleen: Size and appearance within normal limits. Right Kidney: Length: 11.1 cm. Echogenicity within normal limits. Nonobstructive renal calculi measure up to 8 mm. No mass or hydronephrosis visualized. Left Kidney: Not visualized, obscured by bowel gas and patient habitus Abdominal aorta: Not well visualized. Other findings: Trace ascites. IMPRESSION: 1. Hepatomegaly with nodular hepatic contour suggestive of cirrhosis. No focal liver lesion identified. 2. Gallbladder wall thickening without dilation of the gallbladder likely reflects sequela of chronic hepatocellular disease. 3. Nonobstructive right renal calculi. 4. Trace ascites. Electronically Signed   By: Dahlia Bailiff M.D.   On: 05/05/2021 11:29  ? ?DG CHEST PORT 1 VIEW ? ?Result Date: 05/06/2021 ?CLINICAL DATA:  Shortness of breath.  ESRD.  Septic shock. EXAM: PORTABLE CHEST 1 VIEW COMPARISON:  05/04/2021 FINDINGS: 4:53 a.m., 05/06/2021. Right IJ dialysis catheter tip remains in the right atrium. The heart is enlarged. There is aortic atherosclerosis. Stable mediastinum with widening superiorly. There is interval worsening perihilar vascular engorgement and flow cephalization, and worsening interstitial edema in the right-greater-than-left lungs, moderate  pleural effusions both slightly increased. There are perihilar opacities on the right-greater-than-left also increased consistent with edema, pneumonia or combination with dense left lower lobe consolidation again shown. There is osteopenia with thoracic cage grossly intact. In all other respects no further changes. IMPRESSION: 1. Worsening findings of CHF or fluid overload. 2.  Increased moderate pleural effusions. 3. Persistent left basilar consolidation with bilateral increased opacities in the right-greater-than-left consistent with edema, pneumonia or combination. 4. Aortic atheroscle

## 2021-05-06 NOTE — Progress Notes (Signed)
OT Cancellation Note ? ?Patient Details ?Name: William Flores ?MRN: [id number] ?DOB: 11-22-1950 ? ? ?Cancelled Treatment:    Reason Eval/Treat Not Completed: Medical issues which prohibited therapy. Pt still not medically ready for evaluation per nursing report. Will attempt again tomorrow if time allows.  ? ?Arlyss Weathersby OT, MOT ? ? ?Larey Seat ?05/06/2021, 9:25 AM ?

## 2021-05-06 NOTE — Progress Notes (Addendum)
Palliative: ?William Flores Flores, William Flores Flores, is lying quietly in bed.  He appears acutely/chronically ill and somewhat frail.  He is very sleepy today but will open eyes when requested.  He does not make eye contact.  He is able to mumble his name, but does not answer my other orientation questions.  I do not believe that he can make his basic needs known.  There is no family at bedside at this time. ? ?Call to daughter/healthcare surrogate, William Flores Flores.  We talked in detail about William Flores Flores acute health concerns.  Overall, [name] seems knowledgeable, and is able to accurately relay what is happening.  We reviewed cardiology note and recommendations including increased moderate bilateral pleural effusions, nephrology note and recommendations including plan for HD today, use of Levophed, possible need for CVVHD.  We talk about CCM consult and recommendations.  We talk about sepsis and the treatment plan. ? ?We talk about how to make choices for loved ones including 1) keeping them at the center of decision-making, not what we want for them 2) are we doing something for him or to him, can we change what is happening 3) the person William Flores Flores was 10 years ago, how would that man tell William Flores to care for him now.   We talk about meaningful improvements.  [name] shares her distress and decision making.  She states that William Flores Flores was on CVVHD, was transferred to [facility] in December and improved.  She goes further to share that things were different then, and he is much sicker now.  We talk about comfort care, dignity at end-of-life.  [name] is appropriately tearful.  She states she plans to come and see William Flores this evening after 7.  ? ?Conference with attending, bedside nursing staff, transition of care team related to patient condition, needs, goals of care, disposition. ? ?Plan:   At this point continue to treat the treatable but no CPR or intubation.  Time for outcomes.   Considering transfer to [facility]/CVVHD if unable to tolerate  regular HD versus transitioning to comfort care.  ? ?55 minutes  ?Quinn Axe, NP ?Palliative medicine team ?Team phone [phone] ?Greater than 50% of this time was spent counseling and coordinating care related to the above assessment and plan. ?

## 2021-05-07 DIAGNOSIS — R6521 Severe sepsis with septic shock: Secondary | ICD-10-CM | POA: Diagnosis not present

## 2021-05-07 DIAGNOSIS — J9601 Acute respiratory failure with hypoxia: Secondary | ICD-10-CM | POA: Diagnosis not present

## 2021-05-07 DIAGNOSIS — K920 Hematemesis: Secondary | ICD-10-CM

## 2021-05-07 DIAGNOSIS — A419 Sepsis, unspecified organism: Secondary | ICD-10-CM | POA: Diagnosis not present

## 2021-05-07 DIAGNOSIS — N186 End stage renal disease: Secondary | ICD-10-CM | POA: Diagnosis not present

## 2021-05-07 DIAGNOSIS — K7200 Acute and subacute hepatic failure without coma: Secondary | ICD-10-CM

## 2021-05-07 LAB — CBC WITH DIFFERENTIAL/PLATELET
Abs Immature Granulocytes: 0.11 10*3/uL — ABNORMAL HIGH (ref 0.00–0.07)
Basophils Absolute: 0.1 10*3/uL (ref 0.0–0.1)
Basophils Relative: 2 %
Eosinophils Absolute: 0.1 10*3/uL (ref 0.0–0.5)
Eosinophils Relative: 3 %
HCT: 35.2 % — ABNORMAL LOW (ref 39.0–52.0)
Hemoglobin: 10.5 g/dL — ABNORMAL LOW (ref 13.0–17.0)
Immature Granulocytes: 2 %
Lymphocytes Relative: 12 %
Lymphs Abs: 0.7 10*3/uL (ref 0.7–4.0)
MCH: 30.8 pg (ref 26.0–34.0)
MCHC: 29.8 g/dL — ABNORMAL LOW (ref 30.0–36.0)
MCV: 103.2 fL — ABNORMAL HIGH (ref 80.0–100.0)
Monocytes Absolute: 0.9 10*3/uL (ref 0.1–1.0)
Monocytes Relative: 16 %
Neutro Abs: 3.6 10*3/uL (ref 1.7–7.7)
Neutrophils Relative %: 65 %
Platelets: 76 10*3/uL — ABNORMAL LOW (ref 150–400)
RBC: 3.41 MIL/uL — ABNORMAL LOW (ref 4.22–5.81)
RDW: 25.8 % — ABNORMAL HIGH (ref 11.5–15.5)
WBC: 5.6 10*3/uL (ref 4.0–10.5)
nRBC: 1.8 % — ABNORMAL HIGH (ref 0.0–0.2)

## 2021-05-07 LAB — CBC
HCT: 33.6 % — ABNORMAL LOW (ref 39.0–52.0)
Hemoglobin: 9.9 g/dL — ABNORMAL LOW (ref 13.0–17.0)
MCH: 30.5 pg (ref 26.0–34.0)
MCHC: 29.5 g/dL — ABNORMAL LOW (ref 30.0–36.0)
MCV: 103.4 fL — ABNORMAL HIGH (ref 80.0–100.0)
Platelets: 59 10*3/uL — ABNORMAL LOW (ref 150–400)
RBC: 3.25 MIL/uL — ABNORMAL LOW (ref 4.22–5.81)
RDW: 25.6 % — ABNORMAL HIGH (ref 11.5–15.5)
WBC: 4.5 10*3/uL (ref 4.0–10.5)
nRBC: 2.4 % — ABNORMAL HIGH (ref 0.0–0.2)

## 2021-05-07 LAB — GLUCOSE, CAPILLARY
Glucose-Capillary: 121 mg/dL — ABNORMAL HIGH (ref 70–99)
Glucose-Capillary: 131 mg/dL — ABNORMAL HIGH (ref 70–99)
Glucose-Capillary: 121 mg/dL — ABNORMAL HIGH (ref 70–99)
Glucose-Capillary: 147 mg/dL — ABNORMAL HIGH (ref 70–99)

## 2021-05-07 LAB — COMPREHENSIVE METABOLIC PANEL
ALT: 120 U/L — ABNORMAL HIGH (ref 0–44)
AST: 67 U/L — ABNORMAL HIGH (ref 15–41)
Albumin: 2.7 g/dL — ABNORMAL LOW (ref 3.5–5.0)
Alkaline Phosphatase: 66 U/L (ref 38–126)
Anion gap: 11 (ref 5–15)
BUN: 32 mg/dL — ABNORMAL HIGH (ref 8–23)
CO2: 21 mmol/L — ABNORMAL LOW (ref 22–32)
Calcium: 7.8 mg/dL — ABNORMAL LOW (ref 8.9–10.3)
Chloride: 101 mmol/L (ref 98–111)
Creatinine, Ser: 5.37 mg/dL — ABNORMAL HIGH (ref 0.61–1.24)
GFR, Estimated: 11 mL/min — ABNORMAL LOW (ref >60)
Glucose, Bld: 130 mg/dL — ABNORMAL HIGH (ref 70–99)
Potassium: 4 mmol/L (ref 3.5–5.1)
Sodium: 133 mmol/L — ABNORMAL LOW (ref 135–145)
Total Bilirubin: 1.5 mg/dL — ABNORMAL HIGH (ref 0.3–1.2)
Total Protein: 6.1 g/dL — ABNORMAL LOW (ref 6.5–8.1)

## 2021-05-07 LAB — RENAL FUNCTION PANEL
Albumin: 2.5 g/dL — ABNORMAL LOW (ref 3.5–5.0)
Anion gap: 10 (ref 5–15)
BUN: 35 mg/dL — ABNORMAL HIGH (ref 8–23)
CO2: 23 mmol/L (ref 22–32)
Calcium: 7.9 mg/dL — ABNORMAL LOW (ref 8.9–10.3)
Chloride: 100 mmol/L (ref 98–111)
Creatinine, Ser: 6.02 mg/dL — ABNORMAL HIGH (ref 0.61–1.24)
GFR, Estimated: 9 mL/min — ABNORMAL LOW (ref >60)
Glucose, Bld: 130 mg/dL — ABNORMAL HIGH (ref 70–99)
Phosphorus: 5.3 mg/dL — ABNORMAL HIGH (ref 2.5–4.6)
Potassium: 4.3 mmol/L (ref 3.5–5.1)
Sodium: 133 mmol/L — ABNORMAL LOW (ref 135–145)

## 2021-05-07 LAB — PROTIME-INR
INR: 1.8 — ABNORMAL HIGH (ref 0.8–1.2)
Prothrombin Time: 21.1 seconds — ABNORMAL HIGH (ref 11.4–15.2)

## 2021-05-07 LAB — HEPATITIS B SURFACE ANTIBODY,QUALITATIVE: Hep B S Ab: NONREACTIVE

## 2021-05-07 LAB — HEPATITIS B SURFACE ANTIGEN: Hepatitis B Surface Ag: NONREACTIVE

## 2021-05-07 MED ORDER — LIDOCAINE-PRILOCAINE 2.5-2.5 % EX CREA: 1.0000 "application " | TOPICAL_CREAM | CUTANEOUS | Status: DC | PRN

## 2021-05-07 MED ORDER — LIDOCAINE HCL (PF) 1 % IJ SOLN: 5.0000 mL | INTRAMUSCULAR | Status: DC | PRN

## 2021-05-07 MED ORDER — SODIUM CHLORIDE 0.9 % IV SOLN: 100.0000 mL | INTRAVENOUS | Status: DC | PRN

## 2021-05-07 MED ORDER — PENTAFLUOROPROP-TETRAFLUOROETH EX AERO
1.0000 "application " | INHALATION_SPRAY | CUTANEOUS | Status: DC | PRN
  Filled 2021-05-07: qty 30

## 2021-05-07 MED ORDER — ALTEPLASE 2 MG IJ SOLR
2.0000 mg | Freq: Once | INTRAMUSCULAR | Status: DC | PRN
  Filled 2021-05-07: qty 2

## 2021-05-07 MED ORDER — HEPARIN SODIUM (PORCINE) 1000 UNIT/ML DIALYSIS
1000.0000 [IU] | INTRAMUSCULAR | Status: DC | PRN
  Filled 2021-05-07 (×4): qty 1

## 2021-05-07 NOTE — Plan of Care (Signed)
?  Problem: Acute Rehab OT Goals (only OT should resolve) ?Goal: Pt. Will Perform Grooming ?Flowsheets (Taken 05/07/2021 0948) ?Pt Will Perform Grooming: ? with min assist ? bed level ?Goal: Pt. Will Perform Upper Body Bathing ?Flowsheets (Taken 05/07/2021 0948) ?Pt Will Perform Upper Body Bathing: ? with min assist ? bed level ?Goal: Pt. Will Perform Lower Body Bathing ?Flowsheets (Taken 05/07/2021 0948) ?Pt Will Perform Lower Body Bathing: ? with mod assist ? bed level ?Goal: Pt. Will Perform Upper Body Dressing ?Flowsheets (Taken 05/07/2021 0948) ?Pt Will Perform Upper Body Dressing: ? with min assist ? bed level ?Goal: Pt. Will Perform Lower Body Dressing ?Flowsheets (Taken 05/07/2021 0948) ?Pt Will Perform Lower Body Dressing: ? with mod assist ? sitting/lateral leans ?Goal: Pt. Will Transfer To Toilet ?Flowsheets (Taken 05/07/2021 0948) ?Pt Will Transfer to Toilet: ? with mod assist ? stand pivot transfer ?Goal: Pt. Will Perform Toileting-Clothing Manipulation ?Flowsheets (Taken 05/07/2021 0948) ?Pt Will Perform Toileting - Clothing Manipulation and hygiene: with mod assist ?Goal: Pt/Caregiver Will Perform Home Exercise Program ?Flowsheets (Taken 05/07/2021 0948) ?Pt/caregiver will Perform Home Exercise Program: ? Increased ROM ? Increased strength ? Both right and left upper extremity ? With minimal assist ?  ?Keeli Roberg OT, MOT ? ?

## 2021-05-07 NOTE — Progress Notes (Addendum)
? ?Progress Note ? ?Patient Name: William Flores ?Date of Encounter: 05/07/2021 ? ?[facility] Cardiologist: Minus Breeding, MD  ? ?Subjective  ? ?No chest pain or SOB this am ? ?Inpatient Medications  ?  ?Scheduled Meds: ? allopurinol  200 mg Oral Daily  ? atorvastatin  20 mg Oral QHS  ? Chlorhexidine Gluconate Cloth  6 each Topical Daily  ? Chlorhexidine Gluconate Cloth  6 each Topical Q0600  ? ferrous sulfate  325 mg Oral Q breakfast  ? insulin aspart  0-6 Units Subcutaneous TID WC  ? lactulose  20 g Oral BID  ? levothyroxine  100 mcg Oral q morning  ? midodrine  10 mg Oral TID WC  ? nystatin cream   Topical BID  ? pantoprazole (PROTONIX) IV  40 mg Intravenous Q12H  ? sodium chloride flush  3 mL Intravenous Q12H  ? ?Continuous Infusions: ? sodium chloride    ? sodium chloride    ? ceFEPime (MAXIPIME) IV 1 g (05/06/21 1712)  ? norepinephrine (LEVOPHED) Adult infusion 6 mcg/min (05/07/21 0819)  ? octreotide  (SANDOSTATIN)    IV infusion 50 mcg/hr (05/07/21 0819)  ? ?PRN Meds: ?sodium chloride, acetaminophen **OR** acetaminophen, hydrALAZINE, HYDROmorphone (DILAUDID) injection, ipratropium, levalbuterol, ondansetron **OR** ondansetron (ZOFRAN) IV, oxyCODONE, prochlorperazine, senna-docusate, sodium chloride flush, traMADol, traZODone  ? ?Vital Signs  ?  ?Vitals:  ? 05/07/21 0752 05/07/21 0800 05/07/21 0818 05/07/21 0825  ?BP:  (!) 102/56    ?Pulse:    88  ?Resp:  19 17 (!) 22  ?Temp: (!) 96.4 ?F (35.8 ?C)     ?TempSrc: Axillary     ?SpO2:    99%  ?Weight:      ?Height:      ? ? ?Intake/Output Summary (Last 24 hours) at 05/07/2021 0826 ?Last data filed at 05/07/2021 7169 ?Gross per 24 hour  ?Intake 1535.79 ml  ?Output 1724 ml  ?Net -188.21 ml  ? ? ? ?  05/07/2021  ?  5:00 AM 05/06/2021  ?  6:50 PM 05/06/2021  ?  3:53 AM  ?Last 3 Weights  ?Weight (lbs) 251 lb 1.7 oz 253 lb 4.9 oz 253 lb 4.9 oz  ?Weight (kg) 113.9 kg 114.9 kg 114.9 kg  ?   ? ?Telemetry  ?  ?Atrial fibrillation with controlled ventricular response- Personally  Reviewed ? ?ECG  ?  ?N/a - Personally Reviewed ? ?Physical Exam  ? ?GEN: Well nourished, well developed in no acute distress ?HEENT: Normal ?NECK: No JVD; No carotid bruits ?LYMPHATICS: No lymphadenopathy ?CARDIAC irregularly irregular, no murmurs, rubs, gallops ?RESPIRATORY:  Clear to auscultation without rales, wheezing or rhonchi  ?ABDOMEN: Soft, non-tender, non-distended ?MUSCULOSKELETAL:  No edema; No deformity  ?SKIN: Warm and dry ?NEUROLOGIC:  Alert and oriented x 3 ?PSYCHIATRIC:  Normal affect   ?Labs  ?  ?High Sensitivity Troponin:   ?Recent Labs  ?Lab 05/04/21 ?1228 05/04/21 ?1426  ?TROPONINIHS 417* 413*  ? ?   ?Chemistry ?Recent Labs  ?Lab 05/05/21 ?0750 05/06/21 ?0410 05/07/21 ?0436  ?NA 136 136 133*  ?K 4.8 5.3* 4.0  ?CL 103 105 101  ?CO2 19* 18* 21*  ?GLUCOSE 113* 141* 130*  ?BUN 47* 56* 32*  ?CREATININE 7.19* 7.94* 5.37*  ?CALCIUM 8.4* 8.3* 7.8*  ?MG 2.3  --   --   ?PROT 6.6 6.4* 6.1*  ?ALBUMIN 3.2* 2.9* 2.7*  ?AST 374* 149* 67*  ?ALT 266* 185* 120*  ?ALKPHOS 67 89 38  ?BILITOT 1.7* 1.5* 1.5*  ?GFRNONAA 8* 7*  11*  ?ANIONGAP 14 13 11   ? ?  ?Lipids No results for input(s): CHOL, TRIG, HDL, LABVLDL, LDLCALC, CHOLHDL in the last 168 hours.  ?Hematology ?Recent Labs  ?Lab 05/05/21 ?0750 05/06/21 ?0410 05/07/21 ?0436  ?WBC 4.8 6.5 5.6  ?RBC 3.53* 3.63* 3.41*  ?HGB 10.5* 11.0* 10.5*  ?HCT 37.9* 38.0* 35.2*  ?MCV 107.4* 104.7* 103.2*  ?MCH 29.7 30.3 30.8  ?MCHC 27.7* 28.9* 29.8*  ?RDW 26.3* 25.9* 25.8*  ?PLT 98* 102* 76*  ? ? ?Thyroid No results for input(s): TSH, FREET4 in the last 168 hours.  ?BNP ?Recent Labs  ?Lab 05/04/21 ?1228  ?BNP 1,371.0*  ? ?  ?DDimer No results for input(s): DDIMER in the last 168 hours.  ? ?Radiology  ?  ?US Abdomen Complete ? ?Result Date: 05/05/2021 ?CLINICAL DATA:  Abnormal LFTs. EXAM: ABDOMEN ULTRASOUND COMPLETE COMPARISON:  Same day CT of the abdomen and pelvis. FINDINGS: Gallbladder: Gallbladder wall thickening measuring up to 4 mm without dilation of the gallbladder may  reflect sequela of chronic hepatocellular disease. No sonographic Murphy sign noted by sonographer. Common bile duct: Not identified Liver: No focal lesion identified. Hepatomegaly with nodular hepatic contour. Portal vein is patent on color Doppler imaging with normal direction of blood flow towards the liver. IVC: No abnormality visualized. Pancreas: Visualized portion unremarkable. Spleen: Size and appearance within normal limits. Right Kidney: Length: 11.1 cm. Echogenicity within normal limits. Nonobstructive renal calculi measure up to 8 mm. No mass or hydronephrosis visualized. Left Kidney: Not visualized, obscured by bowel gas and patient habitus Abdominal aorta: Not well visualized. Other findings: Trace ascites. IMPRESSION: 1. Hepatomegaly with nodular hepatic contour suggestive of cirrhosis. No focal liver lesion identified. 2. Gallbladder wall thickening without dilation of the gallbladder likely reflects sequela of chronic hepatocellular disease. 3. Nonobstructive right renal calculi. 4. Trace ascites. Electronically Signed   By: Dahlia Bailiff M.D.   On: 05/05/2021 11:29  ? ?DG CHEST PORT 1 VIEW ? ?Result Date: 05/06/2021 ?CLINICAL DATA:  Shortness of breath.  ESRD.  Septic shock. EXAM: PORTABLE CHEST 1 VIEW COMPARISON:  05/04/2021 FINDINGS: 4:53 a.m., 05/06/2021. Right IJ dialysis catheter tip remains in the right atrium. The heart is enlarged. There is aortic atherosclerosis. Stable mediastinum with widening superiorly. There is interval worsening perihilar vascular engorgement and flow cephalization, and worsening interstitial edema in the right-greater-than-left lungs, moderate pleural effusions both slightly increased. There are perihilar opacities on the right-greater-than-left also increased consistent with edema, pneumonia or combination with dense left lower lobe consolidation again shown. There is osteopenia with thoracic cage grossly intact. In all other respects no further changes. IMPRESSION:  1. Worsening findings of CHF or fluid overload. 2. Increased moderate pleural effusions. 3. Persistent left basilar consolidation with bilateral increased opacities in the right-greater-than-left consistent with edema, pneumonia or combination. 4. Aortic atherosclerosis. Electronically Signed   By: Telford Nab M.D.   On: 05/06/2021 06:08   ? ?Cardiac Studies  ? ? ? ?Patient Profile  ?William Flores is a 71 y.o. male with a hx of chronic RV failure, pulmonary HTN, OSA/OHS, ESRD, recent GI bleed who is being seen 05/04/2021 for the evaluation of hypotension at the request of Dr Roderic Palau. ? ?Assessment & Plan  ?  ?1.Chronic diastolic HF/RV failure ?- 11/2020 echo: LVEF 55-60% no WMAs, indet diastolic function, D-septum, severe RV dysfunction and enlargement, severe pulm HTN, mild to mod MR,  ?05/04/21 echo: LVEF 16-10%, indet diastolic, Dseptum, severe RV dysfunction and enlargement, severe BAE ?- fluid is managed  by HD ?- He had 1.7 L of urine yesterday but is still +1.6 L today ?- Weight is down 2 pounds today but still up from his admission weight of 250 pounds ?- CXR yesterday showed worsening HF, increased moderate bilateral effusions but no chest x-ray this morning ?- levophed support as needed to support HD, if ongoing requirement will require central line.  ?-Volume management per nephrology ?  ?2.Pulmonary HTN ?- followed in HF clinic ?- combined group II and III pulmonary HTN. Diastolic HF, OSA/OHS.  ?04/2019 RHC: mean PA 53, PCWP 23, CI 2.71 PVR 5 WU ?- has been on sildenafil per CHF clinic but this is on hold due to hypotension ?  ?3.Hypotension ?-some degree of chronic hypotension with soft blood pressure this morning102/56 mmHg ?- previously on midodrine at home, from CHF Jan 2023 note plan to change from daily to just as needed for HD ?- on presentation 80/53, MAP 62. Sepsis workup per primary team. He is on emperic abx.  ?- midodrine restarted yesterday 10 mg 3 times daily ?-Remains on nor epi for BP  support ?  ?4.ESRD ?-HD per nephrology, normally T,Th, Sat ?-Hemodialysis been limited due to hypotension from GI bleed ?-Palliative care is now involved and considering transition to comfort care.  The patient'

## 2021-05-07 NOTE — Plan of Care (Signed)

## 2021-05-07 NOTE — Progress Notes (Addendum)
?PROGRESS NOTE ? ? ? ?William Flores  CVE:[id number] DOB: Nov 09, 1950 DOA: 05/04/2021 ?PCP: [name], [name], MD  ? ?  ?Brief Narrative:  ?William Flores is a 71 year old male with multiple medical comorbidities including ESRD on HD TTS, right-sided heart failure, hypotension, hypothyroidism, Nash-liver cirrhosis, chronic anemia, cytopenia, hypokalemia, DM II, A-fib on Eliquis, obesity who presents from [facility] with complaint of nausea, vomiting, and generalized weakness that started yesterday.  Due to his symptoms, patient did not undergo dialysis as scheduled on 5/2.  In the emergency department, patient met sepsis criteria, concerning for multifocal pneumonia.  Patient was started on IV antibiotics and admitted to the hospital.  Nephrology, cardiology, pulmonology, palliative care consulted. ? ?New events last 24 hours / Subjective: ?Patient underwent dialysis yesterday and tolerated well on Levophed.  Patient much more alert and interactive today, making sarcastic jokes. No further vomiting, no abdominal pain. Weaned to 2L O2.  ? ?Assessment & Plan: ?Principal Problem: ?  Septic shock ([place]) ?Active Problems: ?  ESRD (end stage renal disease) on dialysis [facility]) ?  Hypotension ?  Elevated troponin ?  Chronic right heart failure (HCC) ?  OSA (obstructive sleep apnea) ?  Atrial fibrillation ([place]) ?  Chronic respiratory failure with hypoxia (HCC) ?  Gout ?  History of colon cancer ?  DM (diabetes mellitus), type 2 with renal complications ([place]) ?  Mixed hyperlipidemia ?  Iron deficiency anemia ?  [facility]) ?  Morbid obesity ([place]) ?  Nonischemic cardiomyopathy ([place]) ?  Pulmonary hypertension, unspecified ([place]) ?  GERD (gastroesophageal reflux disease) ?  Nausea and vomiting ?  Pain of upper abdomen ?  Constipation ?  Ischemic hepatitis ?  Hematemesis ? ? ?Septic shock secondary to multifocal pneumonia ?-He does have chronic hypotension on midodrine ?-Continue cefepime ?-MRSA PCR  negative, stop vanco ?-Weaning off levophed ? ?Acute on chronic hypoxemic respiratory failure ?-Requires 2 L nasal cannula O2 at baseline ?-Insetting of multifocal pneumonia, missed dialysis sessions ?-CXR showing worsening findings of CHF or fluid overload, increased moderate pleural effusions, persistent left basilar consolidation with bilateral increased opacities in the right-greater-than-left consistent with edema, ?pneumonia or combination - volume management by HD ?-PCCM following  ? ?Hematemesis, acute blood loss anemia ?-Transfused 2 unit packed red blood cells 5/3  ?-Octreotide, PPI ?-Hold Eliquis/IV heparin ?-GI following  ? ?ESRD on HD TTS ?-Nephrology following ?-HD 5/4. Next HD 5/6  ? ?Chronic A-fib ?-Eliquis/IV heparin on hold due to hematemesis ?-Metoprolol on hold due to hypotension ? ?Chronic diastolic heart failure/RV failure  ?NICM ?-Volume management via hemodialysis ?-Cardiology following ? ?Pulmonary hypertension ?-Sildenafil ? ?NASH cirrhosis with elevated LFTs ?-Lactulose  ? ?Nausea, vomiting, abdominal pain ?-CT abdomen pelvis revealed possible acute pancreatitis, lipase 19 ?-Symptoms resolved  ? ?Diabetes mellitus type 2 with renal complications ?-W2H 4.3 ?-Discussed with diabetic coordinator, stop Levemir ?-Continue SSI ? ?Demand ischemia ?-In setting of hypotension, ESRD ?-Echocardiogram showed EF 60 to 65%, no regional wall motion abnormality, RV pressure overload with reduced RV systolic function ? ?Hypothyroidism ?-Synthroid  ? ?HLD ?-Lipitor  ? ? ?DVT prophylaxis:  ?TED hose Start: 05/04/21 1424 ?SCDs Start: 05/04/21 1424 ? ?Code Status: Full ?Family Communication: updated daughter by phone 5/5 ?Disposition Plan:  ?Status is: Inpatient ?Remains inpatient appropriate because: wean off levophed today, ?EGD ? ?Consultants:  ?GI ?Cardiology ?Nephrology ?Palliative care ?PCCM  ? ? ?Antimicrobials:  ?Anti-infectives (From admission, onward)  ? ? Start     Dose/Rate Route Frequency  Ordered  Stop  ? 05/06/21 1200  vancomycin (VANCOCIN) IVPB 1000 mg/200 mL premix  Status:  Discontinued       ? 1,000 mg ?200 mL/hr over 60 Minutes Intravenous Every T-Th-Sa (Hemodialysis) 05/04/21 1531 05/05/21 1348  ? 05/05/21 1800  ceFEPIme (MAXIPIME) 1 g in sodium chloride 0.9 % 100 mL IVPB       ? 1 g ?200 mL/hr over 30 Minutes Intravenous Every 24 hours 05/04/21 1438    ? 05/05/21 1200  ceFEPIme (MAXIPIME) 2 g in sodium chloride 0.9 % 100 mL IVPB  Status:  Discontinued       ? 2 g ?200 mL/hr over 30 Minutes Intravenous Every 24 hours 05/04/21 1219 05/04/21 1438  ? 05/04/21 1500  vancomycin (VANCOREADY) IVPB 2000 mg/400 mL       ? 2,000 mg ?200 mL/hr over 120 Minutes Intravenous  Once 05/04/21 1428 05/04/21 1827  ? 05/04/21 1430  metroNIDAZOLE (FLAGYL) IVPB 500 mg  Status:  Discontinued       ? 500 mg ?100 mL/hr over 60 Minutes Intravenous Every 12 hours 05/04/21 1428 05/06/21 1455  ? 05/04/21 1200  ceFEPIme (MAXIPIME) 2 g in sodium chloride 0.9 % 100 mL IVPB       ? 2 g ?200 mL/hr over 30 Minutes Intravenous  Once 05/04/21 1155 05/04/21 1315  ? 05/04/21 1200  metroNIDAZOLE (FLAGYL) IVPB 500 mg       ? 500 mg ?100 mL/hr over 60 Minutes Intravenous  Once 05/04/21 1155 05/04/21 1422  ? ?  ? ? ? ?Objective: ?Vitals:  ? 05/07/21 0932 05/07/21 1000 05/07/21 1100 05/07/21 1200  ?BP:  (!) 111/59 102/60 (!) 100/44  ?Pulse: 89     ?Resp: _0 ?Temp:   97.9 ?F (36.6 ?C)   ?TempSrc:   Oral   ?SpO2: 98%     ?Weight:      ?Height:      ? ? ?Intake/Output Summary (Last 24 hours) at 05/07/2021 1223 ?Last data filed at 05/07/2021 1202 ?Gross per 24 hour  ?Intake 1390.74 ml  ?Output 1724 ml  ?Net -333.26 ml  ? ? ?Filed Weights  ? 05/06/21 0353 05/06/21 1850 05/07/21 0500  ?Weight: 114.9 kg 114.9 kg 113.9 kg  ? ? ?Examination: ?General exam: Appears calm and comfortable  ?Respiratory system: Clear to auscultation anteriorly. Respiratory effort normal. ?Cardiovascular system: S1 & S2 heard, RRR. No pedal edema. ?Gastrointestinal  system: Abdomen is nondistended, soft and nontender. Normal bowel sounds heard. ?Central nervous system: Alert and oriented. Non focal exam. Speech clear  ?Extremities: Symmetric in appearance bilaterally  ?Skin: No rashes, lesions or ulcers on exposed skin  ?Psychiatry: Judgement and insight appear stable. Mood & affect appropriate.  ? ? ?Data Reviewed: I have personally reviewed following labs and imaging studies ? ?CBC: ?Recent Labs  ?Lab 05/04/21 ?1228 05/04/21 ?1236 05/04/21 ?2311 05/05/21 ?0750 05/06/21 ?0410 05/07/21 ?0436  ?WBC 5.8  --   --  4.8 6.5 5.6  ?NEUTROABS 4.2  --   --   --   --  3.6  ?HGB 11.2* 13.9 8.7* 10.5* 11.0* 10.5*  ?HCT 38.4* 41.0 31.7* 37.9* 38.0* 35.2*  ?MCV 110.0*  --   --  107.4* 104.7* 103.2*  ?PLT 127*  --   --  98* 102* 76*  ? ? ?Basic Metabolic Panel: ?Recent Labs  ?Lab 05/04/21 ?1228 05/04/21 ?1236 05/05/21 ?0750 05/06/21 ?0410 05/07/21 ?0436  ?NA 136 136 136 136 133*  ?K 4.3 4.2 4.8 5.3*  4.0  ?CL 102 100 103 105 101  ?CO2 25  --  19* 18* 21*  ?GLUCOSE 72 68* 113* 141* 130*  ?BUN 40* 36* 47* 56* 32*  ?CREATININE 6.83* 7.70* 7.19* 7.94* 5.37*  ?CALCIUM 8.9  --  8.4* 8.3* 7.8*  ?MG  --   --  2.3  --   --   ? ? ?GFR: ?Estimated Creatinine Clearance: 14.9 mL/min (A) (by C-G formula based on SCr of 5.37 mg/dL (H)). ?Liver Function Tests: ?Recent Labs  ?Lab 05/04/21 ?1228 05/05/21 ?0750 05/06/21 ?0410 05/07/21 ?0436  ?AST 528* 374* 149* 67*  ?ALT 296* 266* 185* 120*  ?ALKPHOS 91 77 73 66  ?BILITOT 0.9 1.7* 1.5* 1.5*  ?PROT 7.3 6.6 6.4* 6.1*  ?ALBUMIN 3.0* 3.2* 2.9* 2.7*  ? ? ?Recent Labs  ?Lab 05/04/21 ?1228 05/05/21 ?0750  ?LIPASE 19 21  ? ? ?Recent Labs  ?Lab 05/06/21 ?1411  ?AMMONIA 41*  ? ?Coagulation Profile: ?Recent Labs  ?Lab 05/04/21 ?1228 05/05/21 ?0750 05/07/21 ?0436  ?INR 2.1* 2.2* 1.8*  ? ? ?Cardiac Enzymes: ?No results for input(s): CKTOTAL, CKMB, CKMBINDEX, TROPONINI in the last 168 hours. ?BNP (last 3 results) ?No results for input(s): PROBNP in the last 8760  hours. ?HbA1C: ?Recent Labs  ?  05/04/21 ?1228  ?HGBA1C 4.3*  ? ? ?CBG: ?Recent Labs  ?Lab 05/06/21 ?1149 05/06/21 ?1532 05/06/21 ?2109 05/07/21 ?6986 05/07/21 ?1128  ?GLUCAP 132* 128* 125* 121* 131*  ? ? ?Lipid Profile:

## 2021-05-07 NOTE — Evaluation (Signed)
Physical Therapy Evaluation ?Patient Details ?Name: William Flores ?MRN: [id number] ?DOB: [birth year] ?Today's Date: 05/07/2021 ? ?History of Present Illness ? William Flores is a 71 year old male with multiple colors including ESRD on HD TTS, right-sided heart failure, hypotension, hypothyroidism, Nash-liver cirrhosis, chronic anemia, cytopenia, hypokalemia, DM II, A-fib  on Eliquis, obesity ...      Presenting today from [facility] with complaint of nausea vomiting and generalized weakness that started yesterday patient.  He refuses hemodialysis today.  Patient was noted for hypotension, and bradycardia therefore was sent to the ED for further evaluation ?  ?Clinical Impression ? Patient demonstrates slow labored movement for sitting up at bedside with c/o discomfort with pressure to BLE, once seated c/o discomfort in scrotal area and limited to standing at bedside due to BLE weakness and c/o fatigue.  Patient demonstrates fair return for using BUE/LE for helping to reposition self with bed in head down position when  put back to bed.  Patient will benefit from continued skilled physical therapy in hospital and recommended venue below to increase strength, balance, endurance for safe ADLs and gait.  ?   ?   ? ?Recommendations for follow up therapy are one component of a multi-disciplinary discharge planning process, led by the attending physician.  Recommendations may be updated based on patient status, additional functional criteria and insurance authorization. ? ?Follow Up Recommendations Skilled nursing-short term rehab (<3 hours/day) ? ?  ?Assistance Recommended at Discharge Set up Supervision/Assistance  ?Patient can return home with the following ? A lot of help with walking and/or transfers;A lot of help with bathing/dressing/bathroom;Assistance with cooking/housework;Help with stairs or ramp for entrance ? ?  ?Equipment Recommendations None recommended by PT  ?Recommendations for Other Services ?    ?   ?Functional Status Assessment Patient has had a recent decline in their functional status and demonstrates the ability to make significant improvements in function in a reasonable and predictable amount of time.  ? ?  ?Precautions / Restrictions Precautions ?Precautions: Fall  ? ?  ? ?Mobility ? Bed Mobility ?Overal bed mobility: Needs Assistance ?Bed Mobility: Supine to Sit ?  ?  ?Supine to sit: Mod assist ?Sit to supine: Mod assist ?  ?General bed mobility comments: slow labored movement requiring Mod assist to move legs ?  ? ?Transfers ?Overall transfer level: Needs assistance ?Equipment used: Rolling walker (2 wheels) ?Transfers: Sit to/from Stand ?Sit to Stand: Mod assist ?  ?  ?  ?  ?  ?General transfer comment: slow labored movement ?  ? ?Ambulation/Gait ?  ?  ?  ?  ?  ?  ?  ?  ? ?Stairs ?  ?  ?  ?  ?  ? ?Wheelchair Mobility ?  ? ?Modified Rankin (Stroke Patients Only) ?  ? ?  ? ?Balance Overall balance assessment: Needs assistance ?Sitting-balance support: Feet supported, No upper extremity supported ?Sitting balance-Leahy Scale: Fair ?Sitting balance - Comments: seated EOB ?  ?Standing balance support: During functional activity, Bilateral upper extremity supported ?Standing balance-Leahy Scale: Poor ?Standing balance comment: fair/poor using RW ?  ?  ?  ?  ?  ?  ?  ?  ?  ?  ?  ?   ? ? ? ?Pertinent Vitals/Pain Pain Assessment ?Pain Assessment: Faces ?Faces Pain Scale: Hurts a little bit ?Pain Location: with movement of legs ?Pain Descriptors / Indicators: Grimacing, Sore ?Pain Intervention(s): Limited activity within patient's tolerance, Monitored during session, Repositioned  ? ? ?Home Living  Family/patient expects to be discharged to:: Skilled nursing facility ?  ?  ?  ?  ?  ?  ?  ?  ?  ?   ?  ?Prior Function Prior Level of Function : Needs assist ?  ?  ?  ?Physical Assist : Mobility (physical);ADLs (physical) ?  ?  ?Mobility Comments: short household distances using RW, "per patient" ?ADLs Comments: At  SNF, requires assist from staff for ADLs. Prior to SNF rehab, pt was at home and Independent with ADLs ?  ? ? ?Hand Dominance  ? Dominant Hand: Right ? ?  ?Extremity/Trunk Assessment  ? Upper Extremity Assessment ?Upper Extremity Assessment: Defer to OT evaluation ?  ? ?Lower Extremity Assessment ?Lower Extremity Assessment: Generalized weakness ?  ? ?Cervical / Trunk Assessment ?Cervical / Trunk Assessment: Normal  ?Communication  ? Communication: No difficulties  ?Cognition Arousal/Alertness: Awake/alert ?Behavior During Therapy: [facility] for tasks assessed/performed ?Overall Cognitive Status: Within Functional Limits for tasks assessed ?  ?  ?  ?  ?  ?  ?  ?  ?  ?  ?  ?  ?  ?  ?  ?  ?  ?  ?  ? ?  ?General Comments   ? ?  ?Exercises    ? ?Assessment/Plan  ?  ?PT Assessment Patient needs continued PT services  ?PT Problem List Decreased strength;Decreased activity tolerance;Decreased balance;Decreased mobility ? ?   ?  ?PT Treatment Interventions DME instruction;Gait training;Stair training;Functional mobility training;Therapeutic activities;Patient/family education;Therapeutic exercise;Balance training   ? ?PT Goals (Current goals can be found in the Care Plan section)  ?Acute Rehab PT Goals ?Patient Stated Goal: return home ?PT Goal Formulation: With patient ?Time For Goal Achievement: 05/21/21 ?Potential to Achieve Goals: Good ? ?  ?Frequency Min 3X/week ?  ? ? ?Co-evaluation   ?  ?  ?  ?  ? ? ?  ?AM-PAC PT "6 Clicks" Mobility  ?Outcome Measure Help needed turning from your back to your side while in a flat bed without using bedrails?: A Lot ?Help needed moving from lying on your back to sitting on the side of a flat bed without using bedrails?: A Lot ?Help needed moving to and from a bed to a chair (including a wheelchair)?: Total ?Help needed standing up from a chair using your arms (e.g., wheelchair or bedside chair)?: A Lot ?Help needed to walk in hospital room?: Total ?Help needed climbing 3-5 steps with a  railing? : Total ?6 Click Score: 9 ? ?  ?End of Session Equipment Utilized During Treatment: Oxygen ?Activity Tolerance: Patient tolerated treatment well;Patient limited by fatigue ?Patient left: in bed;with call bell/phone within reach ?Nurse Communication: Mobility status ?PT Visit Diagnosis: Unsteadiness on feet (R26.81);Other abnormalities of gait and mobility (R26.89);Muscle weakness (generalized) (M62.81) ?  ? ?Time: 5809-9833 ?PT Time Calculation (min) (ACUTE ONLY): 20 min ? ? ?Charges:   PT Evaluation ?$PT Eval Moderate Complexity: 1 Mod ?PT Treatments ?$Therapeutic Activity: 8-22 mins ?  ?   ? ? ?3:28 PM, 05/07/21 ?Lonell Grandchild, MPT ?Physical Therapist with [place] [facility] ?[phone] office ?[phone] mobile phone ? ? ?

## 2021-05-07 NOTE — Progress Notes (Signed)
Patient ID: William Flores, male   DOB: 15-Feb-1950, 71 y.o.   MRN: [id number] ?S:Resting comfortably and completed HD last night with UF of 1.7 liters with assistance of levophed.  Palliative care following and pt is now DNR. ?O:BP (!) 102/56   Pulse 88   Temp (!) 96.4 ?F (35.8 ?C) (Axillary)   Resp (!) 22   Ht 5' 4"  (1.626 m)   Wt 113.9 kg   SpO2 99%   BMI 43.10 kg/m?  ? ?Intake/Output Summary (Last 24 hours) at 05/07/2021 0848 ?Last data filed at 05/07/2021 3295 ?Gross per 24 hour  ?Intake 1535.79 ml  ?Output 1724 ml  ?Net -188.21 ml  ? ?Intake/Output: ?I/O last 3 completed shifts: ?In: 2278.1 [I.V.:1762.2; IV JOACZYSAY:301] ?Out: 1724 [Other:1724] ? Intake/Output this shift: ? Total I/O ?In: 107.3 [I.V.:107.3] ?Out: -  ?Weight change: 0 kg ?SWF:UXNATFT comfortably, chronically ill-appearing ?CVS:IRR IRR ?Resp:CTA ?Abd: +BS, soft, obese, NT ?Ext:no lower ext edema but 1-2 + presacral edema ? ?Recent Labs  ?Lab 05/04/21 ?1228 05/04/21 ?1236 05/05/21 ?0750 05/06/21 ?0410 05/07/21 ?0436  ?NA 136 136 136 136 133*  ?K 4.3 4.2 4.8 5.3* 4.0  ?CL 102 100 103 105 101  ?CO2 25  --  19* 18* 21*  ?GLUCOSE 72 68* 113* 141* 130*  ?BUN 40* 36* 47* 56* 32*  ?CREATININE 6.83* 7.70* 7.19* 7.94* 5.37*  ?ALBUMIN 3.0*  --  3.2* 2.9* 2.7*  ?CALCIUM 8.9  --  8.4* 8.3* 7.8*  ?AST 528*  --  374* 149* 67*  ?ALT 296*  --  266* 185* 120*  ? ?Liver Function Tests: ?Recent Labs  ?Lab 05/05/21 ?0750 05/06/21 ?0410 05/07/21 ?0436  ?AST 374* 149* 67*  ?ALT 266* 185* 120*  ?ALKPHOS 73 22 02  ?BILITOT 1.7* 1.5* 1.5*  ?PROT 6.6 6.4* 6.1*  ?ALBUMIN 3.2* 2.9* 2.7*  ? ?Recent Labs  ?Lab 05/04/21 ?1228 05/05/21 ?0750  ?LIPASE 19 21  ? ?Recent Labs  ?Lab 05/06/21 ?1411  ?AMMONIA 41*  ? ?CBC: ?Recent Labs  ?Lab 05/04/21 ?1228 05/04/21 ?1236 05/05/21 ?0750 05/06/21 ?0410 05/07/21 ?0436  ?WBC 5.8  --  4.8 6.5 5.6  ?NEUTROABS 4.2  --   --   --  3.6  ?HGB 11.2*   < > 10.5* 11.0* 10.5*  ?HCT 38.4*   < > 37.9* 38.0* 35.2*  ?MCV 110.0*  --  107.4* 104.7* 103.2*   ?PLT 127*  --  98* 102* 76*  ? < > = values in this interval not displayed.  ? ?Cardiac Enzymes: ?No results for input(s): CKTOTAL, CKMB, CKMBINDEX, TROPONINI in the last 168 hours. ?CBG: ?Recent Labs  ?Lab 05/06/21 ?5427 05/06/21 ?1149 05/06/21 ?1532 05/06/21 ?2109 05/07/21 ?0754  ?GLUCAP 128* 132* 128* 125* 121*  ? ? ?Iron Studies: No results for input(s): IRON, TIBC, TRANSFERRIN, FERRITIN in the last 72 hours. ?Studies/Results: ?US Abdomen Complete ? ?Result Date: 05/05/2021 ?CLINICAL DATA:  Abnormal LFTs. EXAM: ABDOMEN ULTRASOUND COMPLETE COMPARISON:  Same day CT of the abdomen and pelvis. FINDINGS: Gallbladder: Gallbladder wall thickening measuring up to 4 mm without dilation of the gallbladder may reflect sequela of chronic hepatocellular disease. No sonographic Murphy sign noted by sonographer. Common bile duct: Not identified Liver: No focal lesion identified. Hepatomegaly with nodular hepatic contour. Portal vein is patent on color Doppler imaging with normal direction of blood flow towards the liver. IVC: No abnormality visualized. Pancreas: Visualized portion unremarkable. Spleen: Size and appearance within normal limits. Right Kidney: Length: 11.1 cm. Echogenicity within normal limits. Nonobstructive  renal calculi measure up to 8 mm. No mass or hydronephrosis visualized. Left Kidney: Not visualized, obscured by bowel gas and patient habitus Abdominal aorta: Not well visualized. Other findings: Trace ascites. IMPRESSION: 1. Hepatomegaly with nodular hepatic contour suggestive of cirrhosis. No focal liver lesion identified. 2. Gallbladder wall thickening without dilation of the gallbladder likely reflects sequela of chronic hepatocellular disease. 3. Nonobstructive right renal calculi. 4. Trace ascites. Electronically Signed   By: Dahlia Bailiff M.D.   On: 05/05/2021 11:29  ? ?DG CHEST PORT 1 VIEW ? ?Result Date: 05/06/2021 ?CLINICAL DATA:  Shortness of breath.  ESRD.  Septic shock. EXAM: PORTABLE CHEST 1  VIEW COMPARISON:  05/04/2021 FINDINGS: 4:53 a.m., 05/06/2021. Right IJ dialysis catheter tip remains in the right atrium. The heart is enlarged. There is aortic atherosclerosis. Stable mediastinum with widening superiorly. There is interval worsening perihilar vascular engorgement and flow cephalization, and worsening interstitial edema in the right-greater-than-left lungs, moderate pleural effusions both slightly increased. There are perihilar opacities on the right-greater-than-left also increased consistent with edema, pneumonia or combination with dense left lower lobe consolidation again shown. There is osteopenia with thoracic cage grossly intact. In all other respects no further changes. IMPRESSION: 1. Worsening findings of CHF or fluid overload. 2. Increased moderate pleural effusions. 3. Persistent left basilar consolidation with bilateral increased opacities in the right-greater-than-left consistent with edema, pneumonia or combination. 4. Aortic atherosclerosis. Electronically Signed   By: Telford Nab M.D.   On: 05/06/2021 06:08   ? allopurinol  200 mg Oral Daily  ? atorvastatin  20 mg Oral QHS  ? Chlorhexidine Gluconate Cloth  6 each Topical Daily  ? Chlorhexidine Gluconate Cloth  6 each Topical Q0600  ? ferrous sulfate  325 mg Oral Q breakfast  ? insulin aspart  0-6 Units Subcutaneous TID WC  ? lactulose  20 g Oral BID  ? levothyroxine  100 mcg Oral q morning  ? midodrine  10 mg Oral TID WC  ? nystatin cream   Topical BID  ? pantoprazole (PROTONIX) IV  40 mg Intravenous Q12H  ? sodium chloride flush  3 mL Intravenous Q12H  ? ? ?BMET ?   ?Component Value Date/Time  ? NA 133 (L) 05/07/2021 0436  ? NA 143 08/26/2020 1517  ? K 4.0 05/07/2021 0436  ? CL 101 05/07/2021 0436  ? CO2 21 (L) 05/07/2021 0436  ? GLUCOSE 130 (H) 05/07/2021 0436  ? BUN 32 (H) 05/07/2021 0436  ? BUN 59 (H) 08/26/2020 1517  ? CREATININE 5.37 (H) 05/07/2021 0436  ? CREATININE 2.22 (H) 11/12/2020 1537  ? CALCIUM 7.8 (L) 05/07/2021  0436  ? GFRNONAA 11 (L) 05/07/2021 0436  ? GFRNONAA 71 05/25/2012 1556  ? GFRAA 48 (L) 12/20/2019 1624  ? GFRAA 82 05/25/2012 1556  ? ?CBC ?   ?Component Value Date/Time  ? WBC 5.6 05/07/2021 0436  ? RBC 3.41 (L) 05/07/2021 0436  ? HGB 10.5 (L) 05/07/2021 0436  ? HGB 9.4 (L) 08/26/2020 1517  ? HCT 35.2 (L) 05/07/2021 0436  ? HCT 31.7 (L) 08/26/2020 1517  ? PLT 76 (L) 05/07/2021 0436  ? PLT 142 (L) 08/26/2020 1517  ? MCV 103.2 (H) 05/07/2021 0436  ? MCV 91 08/26/2020 1517  ? MCH 30.8 05/07/2021 0436  ? MCHC 29.8 (L) 05/07/2021 0436  ? RDW 25.8 (H) 05/07/2021 0436  ? RDW 17.5 (H) 08/26/2020 1517  ? LYMPHSABS 0.7 05/07/2021 0436  ? LYMPHSABS 0.7 08/26/2020 1517  ? MONOABS 0.9 05/07/2021 0436  ?  EOSABS 0.1 05/07/2021 0436  ? EOSABS 0.2 08/26/2020 1517  ? BASOSABS 0.1 05/07/2021 0436  ? BASOSABS 0.1 08/26/2020 1517  ? ? ?Dialysis Orders: Javier Docker ?TTS 4hr EDW 108kg ?400/500 2/2.5 T35 ?Heparin 0 Mircera 266mg q2weeks (last 4/12) ?Left at 113.8kg on 4/20 ?  ?Assessment/Plan: ?GIB and symptomatic anemia - s/p blood transfusion of 2 units PRBC's.  Hgb trending down.  Colonoscopy on 04/25/21 revealed bleeding colonic Dieulafoy lesion, treated with APC.  Transfuse as needed.  Followed by GI today and possible EGD given coffee ground emesis when stable.   ?ESRD- Will plan for HD again tomorrow and will likely require levophed to support BP.  No heparin with HD.  If [facility] cannot tolerate [facility]  Able to UF only 1.7 liters with HD and levophed yesterday. ?Anemia:  acute on chronic s/p blood transfusion.  Hgb improved to 11.  Transfuse prn. ?Sepsis - bilateral infiltrates of lungs concerning for PNA vs pulmonary edema.  Abx per primary service and PCCM.  Cefepime x 7 days. ?Abdominal pain - CT scan with possible acute pancreatitis, although lipase was normal at 19.  GI to evaluate   ?CKD-MBD: continue with home meds when able to take po ?Hypotension - has h/o RV  failure and pulmonary HTN and may require pressors with HD.  Cardiology and PCCM following. ?Liver cirrhosis secondary to NASH - abnormal LFT's likely due to shock liver and are improving. ?Thrombocytopenia - rela

## 2021-05-07 NOTE — Evaluation (Addendum)
Occupational Therapy Evaluation ?Patient Details ?Name: William Flores ?MRN: [id number] ?DOB: 25-Jul-1950 ?Today's Date: 05/07/2021 ? ? ?History of Present Illness [name] is a 71 year old male with multiple colors including ESRD on HD TTS, right-sided heart failure, hypotension, hypothyroidism, Nash-liver cirrhosis, chronic anemia, cytopenia, hypokalemia, DM II, A-fib  on Eliquis, obesity ...      Presenting today from [facility] with complaint of nausea vomiting and generalized weakness that started yesterday patient.  He refuses hemodialysis today.  Patient was noted for hypotension, and bradycardia therefore was sent to the ED for further evaluation (Per MD)  ? ?Clinical Impression ?  ?Pt agreeable to OT evaluation. Pt using supplemental O2 throughout session. Pt generally very weak with less than 90* A/ROM in supine position for shoulder flexion. Pt required mod to max A for bed mobility with very slow labored movement. Limited to bedside mobility per MD request. Pt left in bed with bed alarm set and call bell within reach. Pt will benefit from continued OT in the hospital and recommended venue below to increase strength, balance, and endurance for safe ADL's.  ? ?  ?   ? ?Recommendations for follow up therapy are one component of a multi-disciplinary discharge planning process, led by the attending physician.  Recommendations may be updated based on patient status, additional functional criteria and insurance authorization.  ? ?Follow Up Recommendations ? Skilled nursing-short term rehab (<3 hours/day)  ?  ?Assistance Recommended at Discharge Frequent or constant Supervision/Assistance  ?Patient can return home with the following A lot of help with walking and/or transfers;A lot of help with bathing/dressing/bathroom;Assistance with feeding;Assistance with cooking/housework;Assist for transportation;Help with stairs or ramp for entrance ? ?  ?Functional Status Assessment ? Patient has had a recent  decline in their functional status and demonstrates the ability to make significant improvements in function in a reasonable and predictable amount of time.  ?Equipment Recommendations ? None recommended by OT  ?  ?Recommendations for Other Services   ? ? ?  ?Precautions / Restrictions Precautions ?Precautions: Fall ?Restrictions ?Weight Bearing Restrictions: No  ? ?  ? ?Mobility Bed Mobility ?Overal bed mobility: Needs Assistance ?Bed Mobility: Supine to Sit, Sit to Supine ?  ?  ?Supine to sit: Mod assist, Max assist ?Sit to supine: Mod assist, Max assist ?  ?General bed mobility comments: slow labored movement with much assist ?  ? ?Transfers ?  ?  ?  ?  ?  ?  ?  ?  ?  ?  ?  ? ?  ?Balance Overall balance assessment: Needs assistance ?Sitting-balance support: Feet supported, Bilateral upper extremity supported ?Sitting balance-Leahy Scale: Fair ?Sitting balance - Comments: seated EOB ?  ?  ?  ?  ?  ?  ?  ?  ?  ?  ?  ?  ?  ?  ?  ?   ? ?ADL either performed or assessed with clinical judgement  ? ?ADL Overall ADL's : Needs assistance/impaired ?Eating/Feeding: Minimal assistance;Sitting;Moderate assistance ?  ?Grooming: Moderate assistance;Bed level ?  ?Upper Body Bathing: Moderate assistance;Bed level ?  ?Lower Body Bathing: Maximal assistance;Total assistance;Bed level ?  ?Upper Body Dressing : Moderate assistance;Bed level ?  ?Lower Body Dressing: Total assistance;Bed level ?  ?Toilet Transfer: Maximal assistance;Total assistance ?  ?Toileting- Clothing Manipulation and Hygiene: Total assistance;Bed level ?  ?  ?  ?  ?   ? ? ? ?Vision Baseline Vision/History: 1 Wears glasses ?Ability to See in Adequate Light: 1 Impaired ?  Patient Visual Report: No change from baseline ?Vision Assessment?: No apparent visual deficits  ?   ?   ?  ?   ?  ? ?Pertinent Vitals/Pain Pain Assessment ?Pain Assessment: Faces ?Faces Pain Scale: Hurts little more ?Pain Location: shoulders with movement ?Pain Descriptors / Indicators:  Grimacing ?Pain Intervention(s): Limited activity within patient's tolerance, Monitored during session, Repositioned  ? ? ? ?Hand Dominance L  ?  ?Extremity/Trunk Assessment Upper Extremity Assessment ?Upper Extremity Assessment: Generalized weakness (B UE limited below 90* A/ROM. WFL P/ROM flexion and abduction but with pain. Pt is generally very weak.) ?  ?Lower Extremity Assessment ?Lower Extremity Assessment: Defer to PT evaluation ?  ?  ?  ?Communication Communication ?Communication: No difficulties ?  ?Cognition Arousal/Alertness: Awake/alert ?Behavior During Therapy: [facility] for tasks assessed/performed ?Overall Cognitive Status: Within Functional Limits for tasks assessed ?  ?  ?  ?  ?  ?  ?  ?  ?  ?  ?  ?  ?  ?  ?  ?  ?  ?  ?  ?   ? ?  ?   ?  ?    ? ? ?Home Living Family/patient expects to be discharged to:: Skilled nursing facility ?  ?  ?  ?  ?  ?  ?  ?  ?  ?  ?  ?  ?  ?  ?  ?  ?  ?  ? ?  ?Prior Functioning/Environment Prior Level of Function : Needs assist ?  ?  ?  ?Physical Assist : Mobility (physical);ADLs (physical) ?  ?  ?Mobility Comments: At SNF, working on ambulation short distances without DME, reliant on w/c for longer distances; prior to admission to SNF (10/2020 per chart), from home alone mod indep with rollator/RW - per prior PT Evaluation (Per chart review.) ?ADLs Comments: At SNF, requires assist from staff for ADLs. Prior to SNF rehab, pt was at home and Independent with ADLs (per chart review) ?  ? ?  ?  ?OT Problem List: Decreased strength;Decreased range of motion;Decreased activity tolerance;Impaired balance (sitting and/or standing);Cardiopulmonary status limiting activity ?  ?   ?OT Treatment/Interventions: Self-care/ADL training;Therapeutic exercise;Therapeutic activities;Energy conservation;DME and/or AE instruction;Patient/family education;Balance training  ?  ?OT Goals(Current goals can be found in the care plan section) Acute Rehab OT Goals ?Patient Stated Goal: Go to [facility] ?OT Goal Formulation: With patient ?Time For Goal Achievement: 05/21/21 ?Potential to Achieve Goals: Fair  ?OT Frequency: Min 2X/week ?  ? ?   ?  ?  ?  ?  ? ?  ?   ?  ?  ?  ?  ?  ?  ?  ?End of Session Equipment Utilized During Treatment: Oxygen ? ?Activity Tolerance: Patient tolerated treatment well ?Patient left: in bed;with call bell/phone within reach;with bed alarm set ? ?OT Visit Diagnosis: Unsteadiness on feet (R26.81);Other abnormalities of gait and mobility (R26.89);Muscle weakness (generalized) (M62.81)  ?              ?Time: 8242-3536 ?OT Time Calculation (min): 16 min ?Charges:  OT General Charges ?$OT Visit: 1 Visit ?OT Evaluation ?$OT Eval Moderate Complexity: 1 Mod ? ?Mariaguadalupe Fialkowski OT, MOT ? ?Larey Seat ?05/07/2021, 9:45 AM ?

## 2021-05-07 NOTE — Plan of Care (Signed)
?  Problem: Acute Rehab PT Goals(only PT should resolve) ?Goal: Pt Will Go Supine/Side To Sit ?Outcome: Progressing ?Flowsheets (Taken 05/07/2021 1529) ?Pt will go Supine/Side to Sit: with minimal assist ?Goal: Patient Will Transfer Sit To/From Stand ?Outcome: Progressing ?Flowsheets (Taken 05/07/2021 1529) ?Patient will transfer sit to/from stand: with minimal assist ?Goal: Pt Will Transfer Bed To Chair/Chair To Bed ?Outcome: Progressing ?Flowsheets (Taken 05/07/2021 1529) ?Pt will Transfer Bed to Chair/Chair to Bed: ? with min assist ? with mod assist ?Goal: Pt Will Ambulate ?Outcome: Progressing ?Flowsheets (Taken 05/07/2021 1529) ?Pt will Ambulate: ? 15 feet ? with moderate assist ? with rolling walker ?  ?3:30 PM, 05/07/21 ?Lonell Grandchild, MPT ?Physical Therapist with [place] ?[facility] ?[phone] office ?[phone] mobile phone ? ?

## 2021-05-07 NOTE — Progress Notes (Signed)
? ?NAME:  William Flores, MRN:  [id number], DOB:  [birth year], LOS: 3 ?ADMISSION DATE:  05/04/2021, CONSULTATION DATE:  05/07/2021 ? ?REFERRING MD:  Maylene Roes, TRH, CHIEF COMPLAINT:  resp distress  ? ?History of Present Illness:  ?71 year old man admitted from [facility] SNF on 5/2 for generalized weakness, nausea vomiting and abdominal pain.  Missed dialysis on 5/2.  ED evaluation showed no leukocytosis,  chest x-ray suggestive of patchy airspace disease bilateral concerning for multifocal pneumonia.  He was hypotensive , given 1 L of fluids.  Developed coffee-ground emesis overnight with drop in hemoglobin from 11.2-8.7, given 2 units PRBC ?CT abdomen/pelvis was obtained which showed mild pancreatic inflammation similar to prior CT in October, bladder wall thickening and marked bibasilar consolidation with pleural effusions ?He required high flow nasal cannula overnight, and PCCM consulted ?ABG was 7.24/50/123 ? ?Pertinent  Medical History  ?ESRD on HD TTS ?Liver cirrhosis-NASH ?Chronic diastolic heart failure/RV failure, echo 11/2020 D septum with moderate MR , 04/2019 RHC: mean PA 53, PCWP 23, CI 2.71 PVR 5 WU ?Atrial fibrillation on Eliquis ?Diabetes type 2 ?Hypothyroidism ?Chronic hypotension on midodrine ? ?Significant Hospital Events: ?Including procedures, antibiotic start and stop dates in addition to other pertinent events   ? ? ?Interim History / Subjective:  ? ?Improving critically ill. ?Tolerated dialysis yesterday with removal of 1.7 L of fluid. ?Down to 2 L nasal cannula. ?Afebrile. ?Remains on low-dose Levophed ? ? ?Objective   ?Blood pressure (!) 100/44, pulse 89, temperature 97.9 ?F (36.6 ?C), temperature source Oral, resp. rate 19, height 5' 4"  (1.626 m), weight 113.9 kg, SpO2 98 %. ?   ?FiO2 (%):  [40 %-45 %] 40 %  ? ?Intake/Output Summary (Last 24 hours) at 05/07/2021 1255 ?Last data filed at 05/07/2021 1202 ?Gross per 24 hour  ?Intake 1390.74 ml  ?Output 1724 ml  ?Net -333.26 ml  ? ? ?Filed Weights  ?  05/06/21 0353 05/06/21 1850 05/07/21 0500  ?Weight: 114.9 kg 114.9 kg 113.9 kg  ? ? ?Examination: ?General: Chronically ill-appearing, no distress, on nasal cannula ?HENT: Mild pallor, no icterus, no JVD ?Lungs: Decreased breath sounds bilateral, no accessory muscle use ?Cardiovascular: S1-S2 distant, no murmur , right tunneled permacath ?Abdomen: Soft, distended, no tenderness, guarding ?Extremities: 1+ edema, no deformity ?Neuro: Somnolent, alert and oriented when aroused, no asterixis ?GU: Condom catheter, no urine ? ?Labs show normal electrolytes, LFTs continue to decrease, no leukocytosis, stable anemia, mild macrocytosis ? ?Chest x-ray 5/4 independently reviewed shows worsening edema and effusions left basal consolidation ? ?Resolved Hospital Problem list   ? ? ?Assessment & Plan:  ?Acute hypoxic/hypercarbic respiratory failure ?Multifocal pneumonia, resides in SNF , possible aspiration ?-Resolving, down to 2 L nasal cannula ?-Continue cefepime, plan 7 days, okay to DC Flagyl and vancomycin since MRSA PCR negative ? ?Septic shock -lactate was normal, noted chronic hypotension on midodrine ?-On minimal Levophed, expect that we can taper this off but may require again on dialysis ? ?Upper GI bleed - Colonoscopy on 04/25/21 revealed bleeding colonic Dieulafoy lesion, treated with APC. ?-EGD/small bowel enteroscopy in 06/2020 did not show varices ?-Lipase is normal, doubt pancreatitis, pancreatic inflammation appears to be chronic ? ? ?-hold anticoagulation ?-Can DC octreotide in my opinion ?-q 12 PPI for gastritis ?-can proceed with endoscopy with due risk ? ? ?-Elevated LFTs could be related to right heart failure versus shock liver , decreasing ? ?ESRD on HD -tolerating HD with low-dose Levophed, repeat HD session planned tomorrow ? ?Chronic diastolic heart failure/RV  failure ?-Hold metoprolol, sildenafil ?Atrial fibrillation -apixaban is probably not the right medication for him, doubt if he is a candidate for  anticoagulation at all , defer to cardiology who is following ? ?Palliative care input appreciated, DNR issued ?PCCM will be available as needed, please consult again on Monday if required ? ?Best Practice (right click and "Reselect all SmartList Selections" daily)  ? ?Diet/type: NPO ?DVT prophylaxis: SCD ?GI prophylaxis: PPI ?Lines: Dialysis Catheter ?Foley:  N/A ?Code Status:  DNR ?Last date of multidisciplinary goals of care discussion [5/4 ] ? ?Labs   ?CBC: ?Recent Labs  ?Lab 05/04/21 ?1228 05/04/21 ?1236 05/04/21 ?2311 05/05/21 ?0750 05/06/21 ?0410 05/07/21 ?0436  ?WBC 5.8  --   --  4.8 6.5 5.6  ?NEUTROABS 4.2  --   --   --   --  3.6  ?HGB 11.2* 13.9 8.7* 10.5* 11.0* 10.5*  ?HCT 38.4* 41.0 31.7* 37.9* 38.0* 35.2*  ?MCV 110.0*  --   --  107.4* 104.7* 103.2*  ?PLT 127*  --   --  98* 102* 76*  ? ? ? ?Basic Metabolic Panel: ?Recent Labs  ?Lab 05/04/21 ?1228 05/04/21 ?1236 05/05/21 ?0750 05/06/21 ?0410 05/07/21 ?0436  ?NA 136 136 136 136 133*  ?K 4.3 4.2 4.8 5.3* 4.0  ?CL 102 100 103 105 101  ?CO2 25  --  19* 18* 21*  ?GLUCOSE 72 68* 113* 141* 130*  ?BUN 40* 36* 47* 56* 32*  ?CREATININE 6.83* 7.70* 7.19* 7.94* 5.37*  ?CALCIUM 8.9  --  8.4* 8.3* 7.8*  ?MG  --   --  2.3  --   --   ? ? ?GFR: ?Estimated Creatinine Clearance: 14.9 mL/min (A) (by C-G formula based on SCr of 5.37 mg/dL (H)). ?Recent Labs  ?Lab 05/04/21 ?1228 05/04/21 ?1439 05/05/21 ?0750 05/06/21 ?0410 05/07/21 ?0436  ?PROCALCITON  --  0.53  --   --   --   ?WBC 5.8  --  4.8 6.5 5.6  ?LATICACIDVEN 1.7  --   --   --   --   ? ? ? ?Liver Function Tests: ?Recent Labs  ?Lab 05/04/21 ?1228 05/05/21 ?0750 05/06/21 ?0410 05/07/21 ?0436  ?AST 528* 374* 149* 67*  ?ALT 296* 266* 185* 120*  ?ALKPHOS 91 77 73 66  ?BILITOT 0.9 1.7* 1.5* 1.5*  ?PROT 7.3 6.6 6.4* 6.1*  ?ALBUMIN 3.0* 3.2* 2.9* 2.7*  ? ? ?Recent Labs  ?Lab 05/04/21 ?1228 05/05/21 ?0750  ?LIPASE 19 21  ? ? ?Recent Labs  ?Lab 05/06/21 ?1411  ?AMMONIA 41*  ? ? ?ABG ?   ?Component Value Date/Time  ? PHART 7.21  (L) 05/06/2021 0449  ? PCO2ART 55 (H) 05/06/2021 0449  ? PO2ART 72 (L) 05/06/2021 0449  ? HCO3 22.0 05/06/2021 0449  ? TCO2 27 05/04/2021 1236  ? ACIDBASEDEF 6.4 (H) 05/06/2021 0449  ? O2SAT 93.8 05/06/2021 0449  ? ?  ? ?Coagulation Profile: ?Recent Labs  ?Lab 05/04/21 ?1228 05/05/21 ?0750 05/07/21 ?0436  ?INR 2.1* 2.2* 1.8*  ? ? ? ?Cardiac Enzymes: ?No results for input(s): CKTOTAL, CKMB, CKMBINDEX, TROPONINI in the last 168 hours. ? ?HbA1C: ?HB A1C (BAYER DCA - WAIVED)  ?Date/Time Value Ref Range Status  ?03/19/2020 01:11 PM 9.6 (H) <7.0 % Final  ?  Comment:  ?  Diabetic Adult            <7.0 ?                                      Healthy Adult        4.3 - 5.7 ?                                                          (DCCT/NGSP) ?American Diabetes Association's Summary of Glycemic Recommendations ?for Adults with Diabetes: Hemoglobin A1c <7.0%. More stringent ?glycemic goals (A1c <6.0%) may further reduce complications at the ?cost of increased risk of hypoglycemia. ?  ?12/20/2019 04:15 PM 9.9 (H) <7.0 % Final  ?  Comment:  ?                                        Diabetic Adult            <7.0 ?                                      Healthy Adult        4.3 - 5.7 ?                                                          (DCCT/NGSP) ?American Diabetes Association's Summary of Glycemic Recommendations ?for Adults with Diabetes: Hemoglobin A1c <7.0%. More stringent ?glycemic goals (A1c <6.0%) may further reduce complications at the ?cost of increased risk of hypoglycemia. ?  ? ?Hgb A1c MFr Bld  ?Date/Time Value Ref Range Status  ?05/04/2021 12:28 PM 4.3 (L) 4.8 - 5.6 % Final  ?  Comment:  ?  (NOTE) ?Pre diabetes:          5.7%-6.4% ? ?Diabetes:              >6.4% ? ?Glycemic control for   <7.0% ?adults with diabetes ?  ?10/14/2020 02:39 PM 7.1 (H) 4.8 - 5.6 % Final  ?  Comment:  ?  (NOTE) ?Pre diabetes:          5.7%-6.4% ? ?Diabetes:              >6.4% ? ?Glycemic control for    <7.0% ?adults with diabetes ?  ? ? ?CBG: ?Recent Labs  ?Lab 05/06/21 ?1149 05/06/21 ?1532 05/06/21 ?2109 05/07/21 ?0254 05/07/21 ?1128  ?GLUCAP 132* 128* 125* 121* 131*  ? ? ?Kara Mead MD. Shade Flood. ?[place]

## 2021-05-07 NOTE — Progress Notes (Signed)
? ?Gastroenterology Progress Note  ? ?Referring Provider: No ref. provider found ?Primary Care Physician:  [name], [name], MD ?Primary Gastroenterologist:  Dr. Jenetta Downer ? ?Patient ID: William Flores; [id number]; 12/26/1950  ? ? ?Subjective  ? ?No abdominal pain. Has been alert today. Still has not had a bowel movements. Denies nausea, no further vomiting. No reports of confusion. Currently on baseline 2L [place] with stable saturations. Per nursing and chart his blood pressures have been on the softer side, currently on levo @ 3 mcg/min, max dose has been 6 mcg/min. He answers all questions appropriately and is engaged in conversation.  ? ? ?Objective  ? ?Vital signs in last 24 hours ?Temp:  [96.4 ?F (35.8 ?C)-97.7 ?F (36.5 ?C)] 96.4 ?F (35.8 ?C) (05/05 1025) ?Pulse Rate:  [63-98] 88 (05/05 0445) ?Resp:  [8-23] 17 (05/05 0545) ?BP: (82-131)/(40-114) 111/51 (05/05 0545) ?SpO2:  [68 %-100 %] 94 % (05/05 0445) ?FiO2 (%):  [40 %-50 %] 40 % (05/04 1522) ?Weight:  [113.9 kg-114.9 kg] 113.9 kg (05/05 0500) ?Last BM Date : 05/04/21 ? ?Physical Exam ?General:   Alert and oriented, pleasant ?Head:  Normocephalic and atraumatic. ?Eyes:  No icterus, sclera clear. Conjuctiva pink.  ?Heart:  S1, S2 present, no murmurs noted.  ?Lungs: Clear to auscultation bilaterally, without wheezing, rales, or rhonchi.  ?Abdomen:  Bowel sounds present, soft, mild tenderness to RUQ. No guarding. Reducible umbilical hernia present.  ?Msk:  Symmetrical without gross deformities. Normal posture. ?Pulses:  Normal pulses noted. ?Extremities:  Without clubbing or edema. ?Neurologic:  Alert and  oriented x4;  grossly normal neurologically. No asterixis ?Skin:  Warm and dry, intact without significant lesions.  ?Psych:  Alert and cooperative. Normal mood and affect. ? ?Intake/Output from previous day: ?05/04 0701 - 05/05 0700 ?In: 2278.1 [I.V.:1762.2; IV ENIDPOEUM:353] ?Out: 1724  ?Intake/Output this shift: ?No intake/output data recorded. ? ?Lab  Results ? ?Recent Labs  ?  05/05/21 ?0750 05/06/21 ?0410 05/07/21 ?0436  ?WBC 4.8 6.5 5.6  ?HGB 10.5* 11.0* 10.5*  ?HCT 37.9* 38.0* 35.2*  ?PLT 98* 102* 76*  ? ?BMET ?Recent Labs  ?  05/05/21 ?0750 05/06/21 ?0410 05/07/21 ?0436  ?NA 136 136 133*  ?K 4.8 5.3* 4.0  ?CL 103 105 101  ?CO2 19* 18* 21*  ?GLUCOSE 113* 141* 130*  ?BUN 47* 56* 32*  ?CREATININE 7.19* 7.94* 5.37*  ?CALCIUM 8.4* 8.3* 7.8*  ? ?LFT ?Recent Labs  ?  05/05/21 ?0750 05/06/21 ?0410 05/07/21 ?0436  ?PROT 6.6 6.4* 6.1*  ?ALBUMIN 3.2* 2.9* 2.7*  ?AST 374* 149* 67*  ?ALT 266* 185* 120*  ?ALKPHOS 61 44 31  ?BILITOT 1.7* 1.5* 1.5*  ?BILIDIR  --  0.6*  --   ?IBILI  --  0.9  --   ? ?PT/INR ?Recent Labs  ?  05/05/21 ?0750 05/07/21 ?0436  ?LABPROT 24.3* 21.1*  ?INR 2.2* 1.8*  ? ?Hepatitis Panel ?No results for input(s): HEPBSAG, HCVAB, HEPAIGM, HEPBIGM in the last 72 hours. ? ?Studies/Results ?CT ABDOMEN PELVIS WO CONTRAST ? ?Result Date: 05/05/2021 ?CLINICAL DATA:  Abdominal pain. EXAM: CT ABDOMEN AND PELVIS WITHOUT CONTRAST TECHNIQUE: Multidetector CT imaging of the abdomen and pelvis was performed following the standard protocol without IV contrast. RADIATION DOSE REDUCTION: This exam was performed according to the departmental dose-optimization program which includes automated exposure control, adjustment of the mA and/or kV according to patient size and/or use of iterative reconstruction technique. COMPARISON:  October 14, 2020 FINDINGS: Lower chest: Marked severity areas of consolidation are seen within  the bilateral lung bases. There are small bilateral pleural effusions. Hepatobiliary: Nodularity of the liver contour is again seen. No focal liver abnormality is identified. No gallstones, gallbladder wall thickening, or biliary dilatation. Pancreas: Mild peripancreatic inflammatory fat stranding is seen. This is similar in appearance to the findings noted on the prior study. There is no evidence of pancreatic ductal dilatation. Spleen: Normal in size  without focal abnormality. Adrenals/Urinary Tract: Adrenal glands are unremarkable. Bilateral renal cortical thinning is seen without renal calculi or hydronephrosis. Marked severity, predominately anterior urinary bladder wall thickening is seen with moderate severity surrounding inflammatory fat stranding. Stomach/Bowel: Stomach is within normal limits. The appendix is not identified. No evidence of bowel wall thickening, distention, or inflammatory changes. Vascular/Lymphatic: Aortic atherosclerosis. No enlarged abdominal or pelvic lymph nodes. Reproductive: The prostate gland is mildly enlarged. Other: Marked severity anasarca is seen along the lateral aspects of the abdominal and pelvic walls. There is a mild amount of ascites. Musculoskeletal: A fracture deformity of indeterminate age is seen involving the L4 vertebral body. This represents a new finding when compared to the prior study. Multilevel degenerative changes are seen throughout the lumbar spine. IMPRESSION: 1. Marked severity areas of bibasilar consolidation with small bilateral pleural effusions. 2. Bladder wall thickening which may represent sequelae associated with cystitis. Correlation with urinalysis is recommended. 3. Findings which may represent acute pancreatitis. Correlation with pancreatic enzymes is recommended. 4. Age-indeterminate fracture deformity involving the L4 vertebral body. This represents a new finding when compared to the prior study. Correlation with MRI is recommended. 5. Mild amount of ascites. 6. Aortic atherosclerosis. Aortic Atherosclerosis (ICD10-I70.0). Electronically Signed   By: Virgina Norfolk M.D.   On: 05/05/2021 01:19  ? ?US Abdomen Complete ? ?Result Date: 05/05/2021 ?CLINICAL DATA:  Abnormal LFTs. EXAM: ABDOMEN ULTRASOUND COMPLETE COMPARISON:  Same day CT of the abdomen and pelvis. FINDINGS: Gallbladder: Gallbladder wall thickening measuring up to 4 mm without dilation of the gallbladder may reflect sequela of  chronic hepatocellular disease. No sonographic Murphy sign noted by sonographer. Common bile duct: Not identified Liver: No focal lesion identified. Hepatomegaly with nodular hepatic contour. Portal vein is patent on color Doppler imaging with normal direction of blood flow towards the liver. IVC: No abnormality visualized. Pancreas: Visualized portion unremarkable. Spleen: Size and appearance within normal limits. Right Kidney: Length: 11.1 cm. Echogenicity within normal limits. Nonobstructive renal calculi measure up to 8 mm. No mass or hydronephrosis visualized. Left Kidney: Not visualized, obscured by bowel gas and patient habitus Abdominal aorta: Not well visualized. Other findings: Trace ascites. IMPRESSION: 1. Hepatomegaly with nodular hepatic contour suggestive of cirrhosis. No focal liver lesion identified. 2. Gallbladder wall thickening without dilation of the gallbladder likely reflects sequela of chronic hepatocellular disease. 3. Nonobstructive right renal calculi. 4. Trace ascites. Electronically Signed   By: Dahlia Bailiff M.D.   On: 05/05/2021 11:29  ? ?DG CHEST PORT 1 VIEW ? ?Result Date: 05/06/2021 ?CLINICAL DATA:  Shortness of breath.  ESRD.  Septic shock. EXAM: PORTABLE CHEST 1 VIEW COMPARISON:  05/04/2021 FINDINGS: 4:53 a.m., 05/06/2021. Right IJ dialysis catheter tip remains in the right atrium. The heart is enlarged. There is aortic atherosclerosis. Stable mediastinum with widening superiorly. There is interval worsening perihilar vascular engorgement and flow cephalization, and worsening interstitial edema in the right-greater-than-left lungs, moderate pleural effusions both slightly increased. There are perihilar opacities on the right-greater-than-left also increased consistent with edema, pneumonia or combination with dense left lower lobe consolidation again shown. There is osteopenia with thoracic  cage grossly intact. In all other respects no further changes. IMPRESSION: 1. Worsening  findings of CHF or fluid overload. 2. Increased moderate pleural effusions. 3. Persistent left basilar consolidation with bilateral increased opacities in the right-greater-than-left consistent with edema, pneumonia

## 2021-05-07 NOTE — Care Management Important Message (Signed)
Important Message ? ?Patient Details  ?Name: William Flores ?MRN: [id number] ?Date of Birth: 06/11/1950 ? ? ?Medicare Important Message Given:  Yes ? ? ? ? ?[name] ?05/07/2021, 12:37 PM ?

## 2021-05-08 DIAGNOSIS — R6521 Severe sepsis with septic shock: Secondary | ICD-10-CM | POA: Diagnosis not present

## 2021-05-08 DIAGNOSIS — A419 Sepsis, unspecified organism: Secondary | ICD-10-CM | POA: Diagnosis not present

## 2021-05-08 LAB — URINALYSIS, ROUTINE W REFLEX MICROSCOPIC

## 2021-05-08 LAB — BASIC METABOLIC PANEL
Anion gap: 10 (ref 5–15)
BUN: 38 mg/dL — ABNORMAL HIGH (ref 8–23)
CO2: 23 mmol/L (ref 22–32)
Calcium: 8 mg/dL — ABNORMAL LOW (ref 8.9–10.3)
Chloride: 99 mmol/L (ref 98–111)
Creatinine, Ser: 6.42 mg/dL — ABNORMAL HIGH (ref 0.61–1.24)
GFR, Estimated: 9 mL/min — ABNORMAL LOW (ref >60)
Glucose, Bld: 135 mg/dL — ABNORMAL HIGH (ref 70–99)
Potassium: 4.1 mmol/L (ref 3.5–5.1)
Sodium: 132 mmol/L — ABNORMAL LOW (ref 135–145)

## 2021-05-08 LAB — GLUCOSE, CAPILLARY
Glucose-Capillary: 154 mg/dL — ABNORMAL HIGH (ref 70–99)
Glucose-Capillary: 132 mg/dL — ABNORMAL HIGH (ref 70–99)
Glucose-Capillary: 138 mg/dL — ABNORMAL HIGH (ref 70–99)
Glucose-Capillary: 129 mg/dL — ABNORMAL HIGH (ref 70–99)

## 2021-05-08 LAB — CBC
HCT: 34 % — ABNORMAL LOW (ref 39.0–52.0)
Hemoglobin: 9.8 g/dL — ABNORMAL LOW (ref 13.0–17.0)
MCH: 30 pg (ref 26.0–34.0)
MCHC: 28.8 g/dL — ABNORMAL LOW (ref 30.0–36.0)
MCV: 104 fL — ABNORMAL HIGH (ref 80.0–100.0)
Platelets: 58 10*3/uL — ABNORMAL LOW (ref 150–400)
RBC: 3.27 MIL/uL — ABNORMAL LOW (ref 4.22–5.81)
RDW: 25.5 % — ABNORMAL HIGH (ref 11.5–15.5)
WBC: 6.3 10*3/uL (ref 4.0–10.5)
nRBC: 1.6 % — ABNORMAL HIGH (ref 0.0–0.2)

## 2021-05-08 LAB — URINALYSIS, MICROSCOPIC (REFLEX): WBC, UA: >50 WBC/hpf (ref 0–5)

## 2021-05-08 MED ORDER — LACTULOSE 10 GM/15ML PO SOLN
20.0000 g | Freq: Three times a day (TID) | ORAL | Status: DC
  Administered 2021-05-08 – 2021-05-09 (×3): 20 g via ORAL
  Filled 2021-05-08 (×3): qty 30

## 2021-05-08 MED ORDER — HEPARIN SODIUM (PORCINE) 1000 UNIT/ML IJ SOLN
INTRAMUSCULAR | Status: AC
  Administered 2021-05-08: 1000 [IU] via INTRAVENOUS_CENTRAL
  Filled 2021-05-08: qty 2

## 2021-05-08 MED ORDER — ALBUMIN HUMAN 25 % IV SOLN
INTRAVENOUS | Status: AC
  Administered 2021-05-08: 25 g
  Filled 2021-05-08: qty 50

## 2021-05-08 NOTE — Progress Notes (Signed)
?PROGRESS NOTE ? ? ? ?William Flores  BLT:[id number] DOB: 1950-03-24 DOA: 05/04/2021 ?PCP: [name], [name], MD  ? ?  ?Brief Narrative:  ?William Flores is a 71 year old male with multiple medical comorbidities including ESRD on HD TTS, right-sided heart failure, hypotension, hypothyroidism, [place]-liver cirrhosis, chronic anemia, cytopenia, hypokalemia, DM II, A-fib on Eliquis, obesity who presents from [facility] with complaint of nausea, vomiting, and generalized weakness that started yesterday.  Due to his symptoms, patient did not undergo dialysis as scheduled on 5/2.  In the emergency department, patient met sepsis criteria, concerning for multifocal pneumonia.  Patient was started on IV antibiotics and admitted to the hospital.  Nephrology, cardiology, pulmonology, palliative care consulted. ? ?Hospitalization has been complicated by respiratory failure, requiring high flow oxygen.  Episode of hematemesis and blood loss anemia requiring blood transfusion.  Altered mental status.  Hypotension requiring Levophed. ? ?New events last 24 hours / Subjective: ?Patient very fatigued today.  He is arousable and is oriented, but does not stay awake long enough to have any meaningful conversation about the gravity of his illnesses.  Per nephrology, planned for dialysis today.  He remains on Levophed.  I also discussed with Dr. Jenetta Downer at bedside, no plans for endoscopy and can advance diet and watch for overt blood loss. ? ?Assessment & Plan: ?Principal Problem: ?  Septic shock ([place]) ?Active Problems: ?  ESRD (end stage renal disease) on dialysis [facility]) ?  Hypotension ?  Elevated troponin ?  Chronic right heart failure (HCC) ?  OSA (obstructive sleep apnea) ?  Atrial fibrillation ([place]) ?  Chronic respiratory failure with hypoxia (HCC) ?  Gout ?  History of colon cancer ?  DM (diabetes mellitus), type 2 with renal complications ([place]) ?  Mixed hyperlipidemia ?  Iron deficiency anemia ?  Liver cirrhosis secondary to  NASH [facility]) ?  Morbid obesity ([place]) ?  Acute respiratory failure with hypoxia ([place]) ?  Nonischemic cardiomyopathy ([place]) ?  Pulmonary hypertension, unspecified ([place]) ?  GERD (gastroesophageal reflux disease) ?  Nausea and vomiting ?  Pain of upper abdomen ?  Constipation ?  Ischemic hepatitis ?  Hematemesis ? ? ?Septic shock secondary to multifocal pneumonia ?-He does have chronic hypotension on midodrine ?-Continue cefepime ?-MRSA PCR negative, stop vanco ?-Remains on levophed ? ?Acute on chronic hypoxemic respiratory failure ?-Requires 2 L nasal cannula O2 at baseline ?-Insetting of multifocal pneumonia, missed dialysis sessions ?-CXR showing worsening findings of CHF or fluid overload, increased moderate pleural effusions, persistent left basilar consolidation with bilateral increased opacities in the right-greater-than-left consistent with edema, ?pneumonia or combination - volume management by HD ?-PCCM following  ? ?Hematemesis, acute blood loss anemia ?-Transfused 2 unit packed red blood cells 5/3  ?-Octreotide, PPI ?-Hold Eliquis/IV heparin ?-GI following  ?-No plans for endoscopy currently.  Advance diet. ? ?ESRD on HD TTS ?-Nephrology following ?-HD 5/4. Next HD 5/6  ? ?Acute metabolic encephalopathy ?-Waxing and waning ? ?Chronic A-fib ?-Eliquis/IV heparin on hold due to hematemesis ?-Metoprolol on hold due to hypotension ? ?Chronic diastolic heart failure/RV failure  ?NICM ?-Volume management via hemodialysis ?-Cardiology following ? ?Pulmonary hypertension ?-Sildenafil ? ?NASH cirrhosis with elevated LFTs ?-Lactulose, increased dose ? ?Nausea, vomiting, abdominal pain ?-CT abdomen pelvis revealed possible acute pancreatitis, lipase 19 ?-Symptoms resolved  ? ?Diabetes mellitus type 2 with renal complications ?-A0T 4.3 ?-Discussed with diabetic coordinator, stop Levemir ?-Continue SSI ? ?Demand ischemia ?-In setting of hypotension, ESRD ?-Echocardiogram showed EF 60 to 65%, no regional  wall motion  abnormality, RV pressure overload with reduced RV systolic function ? ?Hypothyroidism ?-Synthroid  ? ?HLD ?-Lipitor  ? ? ?DVT prophylaxis:  ?TED hose Start: 05/04/21 1424 ?SCDs Start: 05/04/21 1424 ? ?Code Status: Full ?Family Communication: updated daughter by phone 5/6 ?Disposition Plan:  ?Status is: Inpatient ?Remains inpatient appropriate because: Dialysis planned for today.  Patient remains on Levophed.  Had a serious conversation with daughter who I have been updating daily.  Patient's prognosis remains poor.  If patient continues to require Levophed, and mentation does not significantly improve, likely would recommend transitioning to comfort care and hospice. ? ?Consultants:  ?GI ?Cardiology ?Nephrology ?Palliative care ?PCCM  ? ? ?Antimicrobials:  ?Anti-infectives (From admission, onward)  ? ? Start     Dose/Rate Route Frequency Ordered Stop  ? 05/06/21 1200  vancomycin (VANCOCIN) IVPB 1000 mg/200 mL premix  Status:  Discontinued       ? 1,000 mg ?200 mL/hr over 60 Minutes Intravenous Every T-Th-Sa (Hemodialysis) 05/04/21 1531 05/05/21 1348  ? 05/05/21 1800  ceFEPIme (MAXIPIME) 1 g in sodium chloride 0.9 % 100 mL IVPB       ? 1 g ?200 mL/hr over 30 Minutes Intravenous Every 24 hours 05/04/21 1438    ? 05/05/21 1200  ceFEPIme (MAXIPIME) 2 g in sodium chloride 0.9 % 100 mL IVPB  Status:  Discontinued       ? 2 g ?200 mL/hr over 30 Minutes Intravenous Every 24 hours 05/04/21 1219 05/04/21 1438  ? 05/04/21 1500  vancomycin (VANCOREADY) IVPB 2000 mg/400 mL       ? 2,000 mg ?200 mL/hr over 120 Minutes Intravenous  Once 05/04/21 1428 05/04/21 1827  ? 05/04/21 1430  metroNIDAZOLE (FLAGYL) IVPB 500 mg  Status:  Discontinued       ? 500 mg ?100 mL/hr over 60 Minutes Intravenous Every 12 hours 05/04/21 1428 05/06/21 1455  ? 05/04/21 1200  ceFEPIme (MAXIPIME) 2 g in sodium chloride 0.9 % 100 mL IVPB       ? 2 g ?200 mL/hr over 30 Minutes Intravenous  Once 05/04/21 1155 05/04/21 1315  ? 05/04/21 1200  metroNIDAZOLE  (FLAGYL) IVPB 500 mg       ? 500 mg ?100 mL/hr over 60 Minutes Intravenous  Once 05/04/21 1155 05/04/21 1422  ? ?  ? ? ? ?Objective: ?Vitals:  ? 05/08/21 0700 05/08/21 0732 05/08/21 0800 05/08/21 0900  ?BP: (!) 103/52  (!) 95/53 (!) 96/48  ?Pulse:      ?Resp: (!) 9  (!) 8 10  ?Temp:  (!) 96.2 ?F (35.7 ?C)    ?TempSrc:  Axillary    ?SpO2:      ?Weight:      ?Height:      ? ? ?Intake/Output Summary (Last 24 hours) at 05/08/2021 1028 ?Last data filed at 05/08/2021 [phone] ?Gross per 24 hour  ?Intake 859.45 ml  ?Output 3 ml  ?Net 856.45 ml  ? ? ?Filed Weights  ? 05/06/21 1850 05/07/21 0500 05/08/21 0500  ?Weight: 114.9 kg 113.9 kg 115.8 kg  ? ? ?Examination: ?General exam: Appears calm, but fatigued ?Respiratory system: Clear to auscultation anteriorly. Respiratory effort normal. ?Cardiovascular system: S1 & S2 heard, RRR. No pedal edema. ?Gastrointestinal system: Abdomen is nondistended, soft and nontender. Normal bowel sounds heard. ?Central nervous system: Alert and oriented. Non focal exam. Speech clear  ?Extremities: Symmetric in appearance bilaterally  ?Skin: No rashes, lesions or ulcers on exposed skin  ? ?Data Reviewed: I have personally reviewed following  labs and imaging studies ? ?CBC: ?Recent Labs  ?Lab 05/04/21 ?1228 05/04/21 ?1236 05/05/21 ?0750 05/06/21 ?0410 05/07/21 ?1483 05/07/21 ?1417 05/08/21 ?0429  ?WBC 5.8  --  4.8 6.5 5.6 4.5 6.3  ?NEUTROABS 4.2  --   --   --  3.6  --   --   ?HGB 11.2*   < > 10.5* 11.0* 10.5* 9.9* 9.8*  ?HCT 38.4*   < > 37.9* 38.0* 35.2* 33.6* 34.0*  ?MCV 110.0*  --  107.4* 104.7* 103.2* 103.4* 104.0*  ?PLT 127*  --  98* 102* 76* 59* 58*  ? < > = values in this interval not displayed.  ? ? ?Basic Metabolic Panel: ?Recent Labs  ?Lab 05/05/21 ?0750 05/06/21 ?0410 05/07/21 ?0735 05/07/21 ?1417 05/08/21 ?0429  ?NA 136 136 133* 133* 132*  ?K 4.8 5.3* 4.0 4.3 4.1  ?CL 103 105 101 100 99  ?CO2 19* 18* 21* 23 23  ?GLUCOSE 113* 141* 130* 130* 135*  ?BUN 47* 56* 32* 35* 38*  ?CREATININE 7.19*  7.94* 5.37* 6.02* 6.42*  ?CALCIUM 8.4* 8.3* 7.8* 7.9* 8.0*  ?MG 2.3  --   --   --   --   ?PHOS  --   --   --  5.3*  --   ? ? ?GFR: ?Estimated Creatinine Clearance: 12.6 mL/min (A) (by C-G formula based on SCr of 6.42 m

## 2021-05-08 NOTE — Progress Notes (Signed)
?  PROGRESS NOTE ? ?Checked in on patient this afternoon.  Remains on Levophed.  Has not gone for dialysis yet.  He is much more alert and awake on examination.  Earlier, had urinary retention with bladder scan showing >350 mL.  In and out completed.  Continue to monitor with bladder scans as needed. ? ? ?Dessa Phi, DO ?[facility] ?05/08/2021, 4:25 PM ? ?Available via Epic secure chat 7am-7pm ?After these hours, please refer to coverage provider listed on [email or web address]  ? ?

## 2021-05-08 NOTE — Progress Notes (Signed)
William Flores, M.D. ?Gastroenterology & Hepatology ? ? ?Interval History:  ?No acute events overnight. ?Per nursing staff, he has been more confused than usual. ?He urinated scant amount which had turbid/purulent appearance. ?He has remained on Levophed at 4 mcg/min. ?Patient has not presented any episode of melena or hematochezia and has not been defecating frequently. ?Hemoglobin has remained relatively stable at 9.8. LFTs downtrending. ? ?Inpatient Medications: ? ?Current Facility-Administered Medications:  ?  0.9 %  sodium chloride infusion, 250 mL, Intravenous, PRN, Shahmehdi, Seyed A, MD ?  0.9 %  sodium chloride infusion, 250 mL, Intravenous, Continuous, Dessa Phi, DO ?  0.9 %  sodium chloride infusion, 100 mL, Intravenous, PRN, Donato Heinz, MD ?  0.9 %  sodium chloride infusion, 100 mL, Intravenous, PRN, Donato Heinz, MD ?  acetaminophen (TYLENOL) tablet 650 mg, 650 mg, Oral, Q6H PRN **OR** acetaminophen (TYLENOL) suppository 650 mg, 650 mg, Rectal, Q6H PRN, Shahmehdi, Seyed A, MD ?  allopurinol (ZYLOPRIM) tablet 200 mg, 200 mg, Oral, Daily, Shahmehdi, Seyed A, MD, 200 mg at 05/07/21 1005 ?  alteplase (CATHFLO ACTIVASE) injection 2 mg, 2 mg, Intracatheter, Once PRN, Donato Heinz, MD ?  atorvastatin (LIPITOR) tablet 20 mg, 20 mg, Oral, QHS, Shahmehdi, Seyed A, MD, 20 mg at 05/07/21 2123 ?  ceFEPIme (MAXIPIME) 1 g in sodium chloride 0.9 % 100 mL IVPB, 1 g, Intravenous, Q24H, Coffee, [name], [facility], Stopped at 05/07/21 1744 ?  Chlorhexidine Gluconate Cloth 2 % PADS 6 each, 6 each, Topical, Daily, Shahmehdi, Seyed A, MD, 6 each at 05/07/21 1005 ?  Chlorhexidine Gluconate Cloth 2 % PADS 6 each, 6 each, Topical, Q0600, Donato Heinz, MD, 6 each at 05/08/21 [phone]  ferrous sulfate tablet 325 mg, 325 mg, Oral, Q breakfast, Shahmehdi, Seyed A, MD, 325 mg at 05/08/21 0740 ?  heparin injection 1,000 Units, 1,000 Units, Dialysis, PRN, Donato Heinz, MD ?  hydrALAZINE (APRESOLINE)  injection 10 mg, 10 mg, Intravenous, Q4H PRN, Shahmehdi, Seyed A, MD ?  HYDROmorphone (DILAUDID) injection 0.5-1 mg, 0.5-1 mg, Intravenous, Q2H PRN, Shahmehdi, Seyed A, MD, 1 mg at 05/08/21 0233 ?  insulin aspart (novoLOG) injection 0-6 Units, 0-6 Units, Subcutaneous, TID WC, Shahmehdi, Seyed A, MD, 1 Units at 05/08/21 0745 ?  ipratropium (ATROVENT) nebulizer solution 0.5 mg, 0.5 mg, Nebulization, Q6H PRN, Shahmehdi, Seyed A, MD ?  lactulose (CHRONULAC) 10 GM/15ML solution 20 g, 20 g, Oral, BID, Aliene Altes S, PA-C, 20 g at 05/07/21 2123 ?  levalbuterol (XOPENEX) nebulizer solution 0.63 mg, 0.63 mg, Nebulization, Q6H PRN, Shahmehdi, Seyed A, MD ?  levothyroxine (SYNTHROID) tablet 100 mcg, 100 mcg, Oral, q morning, Shahmehdi, Seyed A, MD, 100 mcg at 05/07/21 1005 ?  lidocaine (PF) (XYLOCAINE) 1 % injection 5 mL, 5 mL, Intradermal, PRN, Donato Heinz, MD ?  lidocaine-prilocaine (EMLA) cream 1 application., 1 application., Topical, PRN, Donato Heinz, MD ?  midodrine (PROAMATINE) tablet 10 mg, 10 mg, Oral, TID WC, Shahmehdi, Seyed A, MD, 10 mg at 05/08/21 0740 ?  norepinephrine (LEVOPHED) 80m in 2556m(0.016 mg/mL) premix infusion, 2-10 mcg/min, Intravenous, Titrated, ChDessa PhiDO, Last Rate: 15 mL/hr at 05/08/21 0739, 4 mcg/min at 05/08/21 [id number]  nystatin cream (MYCOSTATIN), , Topical, BID, ShDeatra JamesMD, Given at 05/07/21 2123 ?  [COMPLETED] octreotide (SANDOSTATIN) 2 mcg/mL load via infusion 50 mcg, 50 mcg, Intravenous, Once, 50 mcg at 05/05/21 0104 **AND** octreotide (SANDOSTATIN) 500 mcg in sodium chloride 0.9 % 250 mL (2 mcg/mL) infusion, 50 mcg/hr, Intravenous, Continuous, Zierle-Ghosh, Asia B,  DO, Last Rate: 25 mL/hr at 05/08/21 0732, 50 mcg/hr at 05/08/21 0732 ?  ondansetron (ZOFRAN) tablet 4 mg, 4 mg, Oral, Q6H PRN **OR** ondansetron (ZOFRAN) injection 4 mg, 4 mg, Intravenous, Q6H PRN, Shahmehdi, Seyed A, MD, 4 mg at 05/07/21 1743 ?  oxyCODONE (Oxy IR/ROXICODONE) immediate  release tablet 5 mg, 5 mg, Oral, Q4H PRN, Shahmehdi, Seyed A, MD, 5 mg at 05/07/21 2025 ?  pantoprazole (PROTONIX) injection 40 mg, 40 mg, Intravenous, Q12H, Zierle-Ghosh, Asia B, DO, 40 mg at 05/07/21 2123 ?  pentafluoroprop-tetrafluoroeth (GEBAUERS) aerosol 1 application., 1 application., Topical, PRN, Donato Heinz, MD ?  prochlorperazine (COMPAZINE) injection 10 mg, 10 mg, Intravenous, Q6H PRN, Zierle-Ghosh, Asia B, DO, 10 mg at 05/04/21 2240 ?  senna-docusate (Senokot-S) tablet 1 tablet, 1 tablet, Oral, QHS PRN, Shahmehdi, Seyed A, MD ?  sodium chloride flush (NS) 0.9 % injection 3 mL, 3 mL, Intravenous, Q12H, Shahmehdi, Seyed A, MD, 3 mL at 05/07/21 2124 ?  sodium chloride flush (NS) 0.9 % injection 3 mL, 3 mL, Intravenous, PRN, Shahmehdi, Seyed A, MD ?  traMADol (ULTRAM) tablet 50 mg, 50 mg, Oral, Q6H PRN, Shahmehdi, Seyed A, MD ?  traZODone (DESYREL) tablet 25 mg, 25 mg, Oral, QHS PRN, Shahmehdi, Seyed A, MD, 25 mg at 05/05/21 2136 ? ?Facility-Administered Medications Ordered in Other Encounters:  ?  0.9 %  sodium chloride infusion, 100 mL, Intravenous, PRN, Dwana Melena, MD ?  0.9 %  sodium chloride infusion, 100 mL, Intravenous, PRN, Dwana Melena, MD ?  alteplase (CATHFLO ACTIVASE) injection 2 mg, 2 mg, Intracatheter, Once PRN, Dwana Melena, MD ?  heparin injection 1,000 Units, 1,000 Units, Dialysis, PRN, Dwana Melena, MD ?  lidocaine (PF) (XYLOCAINE) 1 % injection 5 mL, 5 mL, Intradermal, PRN, Dwana Melena, MD ?  lidocaine-prilocaine (EMLA) cream 1 application., 1 application., Topical, PRN, Dwana Melena, MD ?  pentafluoroprop-tetrafluoroeth Landry Dyke) aerosol 1 application., 1 application., Topical, PRN, Dwana Melena, MD  ? ?I/O   ? ?Intake/Output Summary (Last 24 hours) at 05/08/2021 0858 ?Last data filed at 05/08/2021 0732 ?Gross per 24 hour  ?Intake 1054.86 ml  ?Output 3 ml  ?Net 1051.86 ml  ?  ? ?Physical Exam: ?Temp:  [96.2 ?F (35.7 ?C)-98.1 ?F (36.7 ?C)] 96.2 ?F (35.7 ?C) (05/06 0732) ?Pulse Rate:   [61-105] 61 (05/06 0000) ?Resp:  [8-34] 8 (05/06 0800) ?BP: (71-137)/(33-83) 95/53 (05/06 0800) ?SpO2:  [89 %-98 %] 92 % (05/06 0000) ?Weight:  [115.8 kg] 115.8 kg (05/06 0500)  ?Temp (24hrs), Avg:97.5 ?F (36.4 ?C), Min:96.2 ?F (35.7 ?C), Max:98.1 ?F (36.7 ?C) ?GENERAL: The patient is Awake but confused, in no acute distress. ?HEENT: Head is normocephalic and atraumatic. EOMI are intact. Mouth is well hydrated and without lesions. ?NECK: Supple. No masses ?LUNGS: Clear to auscultation. No presence of rhonchi/wheezing/rales. Adequate chest expansion ?HEART: RRR, normal s1 and s2. ?ABDOMEN: Soft, nontender, no guarding, no peritoneal signs, mildly distended but tympanic. BS +. No masses. ?EXTREMITIES: Without any cyanosis, clubbing, rash, lesions or edema. ?NEUROLOGIC: AOx3, no focal motor deficit. ?SKIN: no jaundice, no rashes ? ?Laboratory Data: ?CBC:  ?   ?Component Value Date/Time  ? WBC 6.3 05/08/2021 0429  ? RBC 3.27 (L) 05/08/2021 0429  ? HGB 9.8 (L) 05/08/2021 0429  ? HGB 9.4 (L) 08/26/2020 1517  ? HCT 34.0 (L) 05/08/2021 0429  ? HCT 31.7 (L) 08/26/2020 1517  ? PLT 58 (L) 05/08/2021 0429  ? PLT 142 (L) 08/26/2020 1517  ?  MCV 104.0 (H) 05/08/2021 0429  ? MCV 91 08/26/2020 1517  ? MCH 30.0 05/08/2021 0429  ? MCHC 28.8 (L) 05/08/2021 0429  ? RDW 25.5 (H) 05/08/2021 0429  ? RDW 17.5 (H) 08/26/2020 1517  ? LYMPHSABS 0.7 05/07/2021 0436  ? LYMPHSABS 0.7 08/26/2020 1517  ? MONOABS 0.9 05/07/2021 0436  ? EOSABS 0.1 05/07/2021 0436  ? EOSABS 0.2 08/26/2020 1517  ? BASOSABS 0.1 05/07/2021 0436  ? BASOSABS 0.1 08/26/2020 1517  ? ?COAG:  ?Lab Results  ?Component Value Date  ? INR 1.8 (H) 05/07/2021  ? INR 2.2 (H) 05/05/2021  ? INR 2.1 (H) 05/04/2021  ?  ?BMP:  ? ?  Latest Ref Rng & Units 05/08/2021  ?  4:29 AM 05/07/2021  ?  2:17 PM 05/07/2021  ?  4:36 AM  ?BMP  ?Glucose 70 - 99 mg/dL 135   130   130    ?BUN 8 - 23 mg/dL 38   35   32    ?Creatinine 0.61 - 1.24 mg/dL 6.42   6.02   5.37    ?Sodium 135 - 145 mmol/L 132   133    133    ?Potassium 3.5 - 5.1 mmol/L 4.1   4.3   4.0    ?Chloride 98 - 111 mmol/L 99   100   101    ?CO2 22 - 32 mmol/L 23   23   21     ?Calcium 8.9 - 10.3 mg/dL 8.0   7.9   7.8    ?  ?HEPATIC:  ? ?  Latest Ref Rng

## 2021-05-09 ENCOUNTER — Inpatient Hospital Stay (HOSPITAL_COMMUNITY): Payer: Medicare Other

## 2021-05-09 DIAGNOSIS — A419 Sepsis, unspecified organism: Secondary | ICD-10-CM | POA: Diagnosis not present

## 2021-05-09 DIAGNOSIS — R6521 Severe sepsis with septic shock: Secondary | ICD-10-CM | POA: Diagnosis not present

## 2021-05-09 LAB — CBC
HCT: 35.1 % — ABNORMAL LOW (ref 39.0–52.0)
Hemoglobin: 10.1 g/dL — ABNORMAL LOW (ref 13.0–17.0)
MCH: 29.6 pg (ref 26.0–34.0)
MCHC: 28.8 g/dL — ABNORMAL LOW (ref 30.0–36.0)
MCV: 102.9 fL — ABNORMAL HIGH (ref 80.0–100.0)
Platelets: 51 10*3/uL — ABNORMAL LOW (ref 150–400)
RBC: 3.41 MIL/uL — ABNORMAL LOW (ref 4.22–5.81)
RDW: 25.3 % — ABNORMAL HIGH (ref 11.5–15.5)
WBC: 5.7 10*3/uL (ref 4.0–10.5)
nRBC: 1.4 % — ABNORMAL HIGH (ref 0.0–0.2)

## 2021-05-09 LAB — BASIC METABOLIC PANEL
Anion gap: 9 (ref 5–15)
BUN: 20 mg/dL (ref 8–23)
CO2: 26 mmol/L (ref 22–32)
Calcium: 8.2 mg/dL — ABNORMAL LOW (ref 8.9–10.3)
Chloride: 99 mmol/L (ref 98–111)
Creatinine, Ser: 4.6 mg/dL — ABNORMAL HIGH (ref 0.61–1.24)
GFR, Estimated: 13 mL/min — ABNORMAL LOW (ref >60)
Glucose, Bld: 128 mg/dL — ABNORMAL HIGH (ref 70–99)
Potassium: 3.4 mmol/L — ABNORMAL LOW (ref 3.5–5.1)
Sodium: 134 mmol/L — ABNORMAL LOW (ref 135–145)

## 2021-05-09 LAB — CULTURE, BLOOD (ROUTINE X 2)
Culture: NO GROWTH
Culture: NO GROWTH
Special Requests: ADEQUATE
Special Requests: ADEQUATE

## 2021-05-09 LAB — GLUCOSE, CAPILLARY
Glucose-Capillary: 133 mg/dL — ABNORMAL HIGH (ref 70–99)
Glucose-Capillary: 128 mg/dL — ABNORMAL HIGH (ref 70–99)
Glucose-Capillary: 151 mg/dL — ABNORMAL HIGH (ref 70–99)
Glucose-Capillary: 143 mg/dL — ABNORMAL HIGH (ref 70–99)

## 2021-05-09 MED ORDER — DOCUSATE SODIUM 100 MG PO CAPS
100.0000 mg | ORAL_CAPSULE | Freq: Two times a day (BID) | ORAL | Status: DC
Start: 2021-05-09 — End: 2021-05-17
  Administered 2021-05-09 – 2021-05-16 (×14): 100 mg via ORAL
  Filled 2021-05-09 (×14): qty 1

## 2021-05-09 MED ORDER — LACTULOSE 10 GM/15ML PO SOLN
30.0000 g | Freq: Three times a day (TID) | ORAL | Status: DC
  Administered 2021-05-09 – 2021-05-10 (×3): 30 g via ORAL
  Filled 2021-05-09 (×3): qty 60

## 2021-05-09 MED ORDER — HYDROCORTISONE SOD SUC (PF) 100 MG IJ SOLR
100.0000 mg | Freq: Three times a day (TID) | INTRAMUSCULAR | Status: DC
Start: 2021-05-09 — End: 2021-05-10
  Administered 2021-05-09 – 2021-05-10 (×3): 100 mg via INTRAVENOUS
  Filled 2021-05-09 (×4): qty 2

## 2021-05-09 MED ORDER — RIFAXIMIN 550 MG PO TABS
550.0000 mg | ORAL_TABLET | Freq: Two times a day (BID) | ORAL | Status: DC
  Administered 2021-05-09 – 2021-05-16 (×16): 550 mg via ORAL
  Filled 2021-05-09 (×16): qty 1

## 2021-05-09 NOTE — Progress Notes (Signed)
William Flores, M.D. ?Gastroenterology & Hepatology ? ? ?Interval History:  ?Patient is more awake today but still somewhat confused. ?Only had 1 bowel movement since yesterday.  Denies any abdominal pain but states his abdomen is slightly distended.  No presence of melena or hematochezia, nausea or vomiting. ?Has remained on vasopressor support, currently on Levophed 5 mcg/min. ?Urine sample was turbid and had bacteria but rest of labs could not be ran in the urine test. ? ?Inpatient Medications: ? ?Current Facility-Administered Medications:  ?  0.9 %  sodium chloride infusion, 250 mL, Intravenous, PRN, Shahmehdi, Seyed A, MD ?  0.9 %  sodium chloride infusion, 250 mL, Intravenous, Continuous, Dessa Phi, DO ?  0.9 %  sodium chloride infusion, 100 mL, Intravenous, PRN, Donato Heinz, MD ?  0.9 %  sodium chloride infusion, 100 mL, Intravenous, PRN, Donato Heinz, MD ?  acetaminophen (TYLENOL) tablet 650 mg, 650 mg, Oral, Q6H PRN **OR** acetaminophen (TYLENOL) suppository 650 mg, 650 mg, Rectal, Q6H PRN, Shahmehdi, Seyed A, MD ?  allopurinol (ZYLOPRIM) tablet 200 mg, 200 mg, Oral, Daily, Shahmehdi, Seyed A, MD, 200 mg at 05/08/21 0935 ?  alteplase (CATHFLO ACTIVASE) injection 2 mg, 2 mg, Intracatheter, Once PRN, Donato Heinz, MD ?  atorvastatin (LIPITOR) tablet 20 mg, 20 mg, Oral, QHS, Shahmehdi, Seyed A, MD, 20 mg at 05/08/21 2146 ?  ceFEPIme (MAXIPIME) 1 g in sodium chloride 0.9 % 100 mL IVPB, 1 g, Intravenous, Q24H, Coffee, [name], Stopped at 05/08/21 1856 ?  Chlorhexidine Gluconate Cloth 2 % PADS 6 each, 6 each, Topical, Daily, Shahmehdi, Seyed A, MD, 6 each at 05/08/21 1234 ?  Chlorhexidine Gluconate Cloth 2 % PADS 6 each, 6 each, Topical, Q0600, Donato Heinz, MD, 6 each at 05/09/21 0645 ?  docusate sodium (COLACE) capsule 100 mg, 100 mg, Oral, BID, Dessa Phi, DO ?  ferrous sulfate tablet 325 mg, 325 mg, Oral, Q breakfast, Shahmehdi, Seyed A, MD, 325 mg at 05/09/21 0746 ?   heparin injection 1,000 Units, 1,000 Units, Dialysis, PRN, Donato Heinz, MD, 1,000 Units at 05/08/21 2359 ?  hydrALAZINE (APRESOLINE) injection 10 mg, 10 mg, Intravenous, Q4H PRN, Shahmehdi, Seyed A, MD ?  hydrocortisone sodium succinate (SOLU-CORTEF) 100 MG injection 100 mg, 100 mg, Intravenous, Q8H, Dessa Phi, DO ?  HYDROmorphone (DILAUDID) injection 0.5-1 mg, 0.5-1 mg, Intravenous, Q2H PRN, Shahmehdi, Seyed A, MD, 1 mg at 05/09/21 0041 ?  insulin aspart (novoLOG) injection 0-6 Units, 0-6 Units, Subcutaneous, TID WC, Shahmehdi, Seyed A, MD, 1 Units at 05/08/21 0745 ?  ipratropium (ATROVENT) nebulizer solution 0.5 mg, 0.5 mg, Nebulization, Q6H PRN, Shahmehdi, Seyed A, MD ?  lactulose (CHRONULAC) 10 GM/15ML solution 20 g, 20 g, Oral, TID, Montez Morita, Krisy Dix, MD, 20 g at 05/08/21 1612 ?  levalbuterol (XOPENEX) nebulizer solution 0.63 mg, 0.63 mg, Nebulization, Q6H PRN, Shahmehdi, Seyed A, MD ?  levothyroxine (SYNTHROID) tablet 100 mcg, 100 mcg, Oral, q morning, Shahmehdi, Seyed A, MD, 100 mcg at 05/08/21 0935 ?  lidocaine (PF) (XYLOCAINE) 1 % injection 5 mL, 5 mL, Intradermal, PRN, Donato Heinz, MD ?  lidocaine-prilocaine (EMLA) cream 1 application., 1 application., Topical, PRN, Donato Heinz, MD ?  midodrine (PROAMATINE) tablet 10 mg, 10 mg, Oral, TID WC, Shahmehdi, Seyed A, MD, 10 mg at 05/09/21 0746 ?  norepinephrine (LEVOPHED) 56m in 2515m(0.016 mg/mL) premix infusion, 2-10 mcg/min, Intravenous, Titrated, ChDessa PhiDO, Last Rate: 18.75 mL/hr at 05/09/21 0611, 5 mcg/min at 05/09/21 [id number]  nystatin cream (MYCOSTATIN), , Topical, BID, Shahmehdi,  Valeria Batman, MD, Given at 05/08/21 2146 ?  ondansetron (ZOFRAN) tablet 4 mg, 4 mg, Oral, Q6H PRN **OR** ondansetron (ZOFRAN) injection 4 mg, 4 mg, Intravenous, Q6H PRN, Shahmehdi, Seyed A, MD, 4 mg at 05/08/21 2255 ?  oxyCODONE (Oxy IR/ROXICODONE) immediate release tablet 5 mg, 5 mg, Oral, Q4H PRN, Shahmehdi, Seyed A, MD, 5 mg at 05/08/21  2048 ?  pantoprazole (PROTONIX) injection 40 mg, 40 mg, Intravenous, Q12H, Zierle-Ghosh, Asia B, DO, 40 mg at 05/08/21 2145 ?  pentafluoroprop-tetrafluoroeth (GEBAUERS) aerosol 1 application., 1 application., Topical, PRN, Donato Heinz, MD ?  prochlorperazine (COMPAZINE) injection 10 mg, 10 mg, Intravenous, Q6H PRN, Zierle-Ghosh, Asia B, DO, 10 mg at 05/04/21 2240 ?  senna-docusate (Senokot-S) tablet 1 tablet, 1 tablet, Oral, QHS PRN, Shahmehdi, Seyed A, MD ?  sodium chloride flush (NS) 0.9 % injection 3 mL, 3 mL, Intravenous, Q12H, Shahmehdi, Seyed A, MD, 3 mL at 05/08/21 2213 ?  sodium chloride flush (NS) 0.9 % injection 3 mL, 3 mL, Intravenous, PRN, Shahmehdi, Seyed A, MD ?  traMADol (ULTRAM) tablet 50 mg, 50 mg, Oral, Q6H PRN, Shahmehdi, Seyed A, MD ?  traZODone (DESYREL) tablet 25 mg, 25 mg, Oral, QHS PRN, Shahmehdi, Seyed A, MD, 25 mg at 05/05/21 2136 ? ?Facility-Administered Medications Ordered in Other Encounters:  ?  0.9 %  sodium chloride infusion, 100 mL, Intravenous, PRN, Dwana Melena, MD ?  0.9 %  sodium chloride infusion, 100 mL, Intravenous, PRN, Dwana Melena, MD ?  alteplase (CATHFLO ACTIVASE) injection 2 mg, 2 mg, Intracatheter, Once PRN, Dwana Melena, MD ?  heparin injection 1,000 Units, 1,000 Units, Dialysis, PRN, Dwana Melena, MD ?  lidocaine (PF) (XYLOCAINE) 1 % injection 5 mL, 5 mL, Intradermal, PRN, Dwana Melena, MD ?  lidocaine-prilocaine (EMLA) cream 1 application., 1 application., Topical, PRN, Dwana Melena, MD ?  pentafluoroprop-tetrafluoroeth Landry Dyke) aerosol 1 application., 1 application., Topical, PRN, Dwana Melena, MD  ? ?I/O   ? ?Intake/Output Summary (Last 24 hours) at 05/09/2021 1003 ?Last data filed at 05/09/2021 4235 ?Gross per 24 hour  ?Intake 509.51 ml  ?Output 3325 ml  ?Net -2815.49 ml  ?  ? ?Physical Exam: ?Temp:  [95.8 ?F (35.4 ?C)-97.6 ?F (36.4 ?C)] 97.6 ?F (36.4 ?C) (05/07 0400) ?Pulse Rate:  [82-99] 84 (05/07 0500) ?Resp:  [6-21] 8 (05/07 0500) ?BP: (92-135)/(47-72)  97/56 (05/07 0500) ?SpO2:  [93 %-100 %] 96 % (05/06 1800) ?Weight:  [112.8 kg-115.8 kg] 112.8 kg (05/07 0500)  ?Temp (24hrs), Avg:97.1 ?F (36.2 ?C), Min:95.8 ?F (35.4 ?C), Max:97.6 ?F (36.4 ?C) ?GENERAL: The patient is Awake but confused, in no acute distress. ?HEENT: Head is normocephalic and atraumatic. EOMI are intact. Mouth is well hydrated and without lesions. ?NECK: Supple. No masses ?LUNGS: Decreased breath sounds. ?HEART: RRR, normal s1 and s2. ?ABDOMEN: Soft, nontender, no guarding, no peritoneal signs, mildly distended but tympanic. BS +. No masses. ?EXTREMITIES: Without any cyanosis, clubbing, rash, lesions. Bilateral pitting edema, worse in R leg. Has asterixis. ?NEUROLOGIC: Awake but confused, no focal motor deficit. ?SKIN: no jaundice, no rashes ? ?Laboratory Data: ?CBC:  ?   ?Component Value Date/Time  ? WBC 5.7 05/09/2021 0520  ? RBC 3.41 (L) 05/09/2021 0520  ? HGB 10.1 (L) 05/09/2021 0520  ? HGB 9.4 (L) 08/26/2020 1517  ? HCT 35.1 (L) 05/09/2021 0520  ? HCT 31.7 (L) 08/26/2020 1517  ? PLT 51 (L) 05/09/2021 0520  ? PLT 142 (L) 08/26/2020 1517  ? MCV 102.9 (H)  05/09/2021 0520  ? MCV 91 08/26/2020 1517  ? MCH 29.6 05/09/2021 0520  ? MCHC 28.8 (L) 05/09/2021 0520  ? RDW 25.3 (H) 05/09/2021 0520  ? RDW 17.5 (H) 08/26/2020 1517  ? LYMPHSABS 0.7 05/07/2021 0436  ? LYMPHSABS 0.7 08/26/2020 1517  ? MONOABS 0.9 05/07/2021 0436  ? EOSABS 0.1 05/07/2021 0436  ? EOSABS 0.2 08/26/2020 1517  ? BASOSABS 0.1 05/07/2021 0436  ? BASOSABS 0.1 08/26/2020 1517  ? ?COAG:  ?Lab Results  ?Component Value Date  ? INR 1.8 (H) 05/07/2021  ? INR 2.2 (H) 05/05/2021  ? INR 2.1 (H) 05/04/2021  ?  ?BMP:  ? ?  Latest Ref Rng & Units 05/09/2021  ?  5:20 AM 05/08/2021  ?  4:29 AM 05/07/2021  ?  2:17 PM  ?BMP  ?Glucose 70 - 99 mg/dL 128   135   130    ?BUN 8 - 23 mg/dL 20   38   35    ?Creatinine 0.61 - 1.24 mg/dL 4.60   6.42   6.02    ?Sodium 135 - 145 mmol/L 134   132   133    ?Potassium 3.5 - 5.1 mmol/L 3.4   4.1   4.3    ?Chloride 98 -  111 mmol/L 99   99   100    ?CO2 22 - 32 mmol/L 26   23   23     ?Calcium 8.9 - 10.3 mg/dL 8.2   8.0   7.9    ?  ?HEPATIC:  ? ?  Latest Ref Rng & Units 05/07/2021  ?  2:17 PM 05/07/2021  ?  4:36 AM 05/06/2021  ?

## 2021-05-09 NOTE — Progress Notes (Signed)
Stopped Levophed around 1800. As of this note systolic blood pressure is 108 map of 69. If needed the line is still available to be restarted.  ?

## 2021-05-09 NOTE — Progress Notes (Signed)
Physical Therapy Treatment ?Patient Details ?Name: William Flores ?MRN: [id number] ?DOB: 1950/12/02 ?Today's Date: 05/09/2021 ? ? ?History of Present Illness ORYAN WINTERTON is a 71 year old male with multiple colors including ESRD on HD TTS, right-sided heart failure, hypotension, hypothyroidism, Nash-liver cirrhosis, chronic anemia, cytopenia, hypokalemia, DM II, A-fib  on Eliquis, obesity ...      Presenting today from [facility] with complaint of nausea vomiting and generalized weakness that started yesterday patient.  He refuses hemodialysis today.  Patient was noted for hypotension, and bradycardia therefore was sent to the ED for further evaluation ? ?  ?PT Comments  ? ? Patient had difficulty sitting up at bedside, but demonstrate increased tolerance/endurance for functional mobility and taking steps at bedside with slow labored movement and limited mostly due to c/o fatigue.  Patient's BP ranged around 91/55 throughout visit with minor c/o dizziness that resolved after sitting up in chair - RN notified.  Patient tolerated sitting up in chair after therapy.  Patient will benefit from continued skilled physical therapy in hospital and recommended venue below to increase strength, balance, endurance for safe ADLs and gait.  ?  ?Recommendations for follow up therapy are one component of a multi-disciplinary discharge planning process, led by the attending physician.  Recommendations may be updated based on patient status, additional functional criteria and insurance authorization. ? ?Follow Up Recommendations ? Skilled nursing-short term rehab (<3 hours/day) ?  ?  ?Assistance Recommended at Discharge Set up Supervision/Assistance  ?Patient can return home with the following A lot of help with walking and/or transfers;A lot of help with bathing/dressing/bathroom;Assistance with cooking/housework;Help with stairs or ramp for entrance ?  ?Equipment Recommendations ? None recommended by PT  ?  ?Recommendations for  Other Services   ? ? ?  ?Precautions / Restrictions Precautions ?Precautions: Fall ?Restrictions ?Weight Bearing Restrictions: No  ?  ? ?Mobility ? Bed Mobility ?Overal bed mobility: Needs Assistance ?Bed Mobility: Rolling, Sidelying to Sit ?Rolling: Min assist ?Sidelying to sit: Mod assist ?  ?Sit to supine: Max assist, Mod assist ?  ?General bed mobility comments: had diffuclty propping up on elbows during side lying to sitting due to c/o weakness ?  ? ?Transfers ?Overall transfer level: Needs assistance ?Equipment used: Rolling walker (2 wheels) ?Transfers: Sit to/from Stand, Bed to chair/wheelchair/BSC ?Sit to Stand: Mod assist ?  ?Step pivot transfers: Min assist, Mod assist ?  ?  ?  ?General transfer comment: increased time, labored movement ?  ? ?Ambulation/Gait ?Ambulation/Gait assistance: Mod assist ?Gait Distance (Feet): 5 Feet ?Assistive device: Rolling walker (2 wheels) ?Gait Pattern/deviations: Decreased step length - right, Decreased step length - left, Decreased stride length ?Gait velocity: decreased ?  ?  ?General Gait Details: limited to a few slow labored side steps due to weakness and c/o fatigue ? ? ?Stairs ?  ?  ?  ?  ?  ? ? ?Wheelchair Mobility ?  ? ?Modified Rankin (Stroke Patients Only) ?  ? ? ?  ?Balance Overall balance assessment: Needs assistance ?Sitting-balance support: Feet supported, No upper extremity supported ?Sitting balance-Leahy Scale: Fair ?Sitting balance - Comments: fair/good seated at EOB ?  ?Standing balance support: During functional activity, Bilateral upper extremity supported ?Standing balance-Leahy Scale: Poor ?Standing balance comment: fair/poor using RW ?  ?  ?  ?  ?  ?  ?  ?  ?  ?  ?  ?  ? ?  ?Cognition Arousal/Alertness: Awake/alert ?Behavior During Therapy: [facility] for tasks assessed/performed ?Overall Cognitive Status:  Within Functional Limits for tasks assessed ?  ?  ?  ?  ?  ?  ?  ?  ?  ?  ?  ?  ?  ?  ?  ?  ?  ?  ?  ? ?  ?Exercises General Exercises - Lower  Extremity ?Long Arc Quad: Seated, AROM, Strengthening, Both, 10 reps ?Toe Raises: Seated, AROM, Strengthening, Both, 10 reps ?Heel Raises: Seated, AROM, Strengthening, Both, 10 reps ? ?  ?General Comments   ?  ?  ? ?Pertinent Vitals/Pain Pain Assessment ?Pain Assessment: Faces ?Faces Pain Scale: Hurts a little bit ?Pain Location: with movement of legs ?Pain Descriptors / Indicators: Grimacing, Sore ?Pain Intervention(s): Limited activity within patient's tolerance, Monitored during session, Repositioned  ? ? ?Home Living   ?  ?  ?  ?  ?  ?  ?  ?  ?  ?   ?  ?Prior Function    ?  ?  ?   ? ?PT Goals (current goals can now be found in the care plan section) Acute Rehab PT Goals ?Patient Stated Goal: return home ?PT Goal Formulation: With patient ?Time For Goal Achievement: 05/21/21 ?Potential to Achieve Goals: Good ?Progress towards PT goals: Progressing toward goals ? ?  ?Frequency ? ? ? Min 3X/week ? ? ? ?  ?PT Plan Current plan remains appropriate  ? ? ?Co-evaluation   ?  ?  ?  ?  ? ?  ?AM-PAC PT "6 Clicks" Mobility   ?Outcome Measure ? Help needed turning from your back to your side while in a flat bed without using bedrails?: A Lot ?Help needed moving from lying on your back to sitting on the side of a flat bed without using bedrails?: A Lot ?Help needed moving to and from a bed to a chair (including a wheelchair)?: A Lot ?Help needed standing up from a chair using your arms (e.g., wheelchair or bedside chair)?: A Lot ?Help needed to walk in hospital room?: A Lot ?Help needed climbing 3-5 steps with a railing? : Total ?6 Click Score: 11 ? ?  ?End of Session Equipment Utilized During Treatment: Oxygen ?Activity Tolerance: Patient tolerated treatment well;Patient limited by fatigue ?Patient left: in bed;with call bell/phone within reach ?Nurse Communication: Mobility status ?PT Visit Diagnosis: Unsteadiness on feet (R26.81);Other abnormalities of gait and mobility (R26.89);Muscle weakness (generalized) (M62.81) ?   ? ? ?Time: 4967-5916 ?PT Time Calculation (min) (ACUTE ONLY): 28 min ? ?Charges:  $Therapeutic Exercise: 8-22 mins ?$Therapeutic Activity: 8-22 mins          ?          ? ?11:39 AM, 05/09/21 ?Lonell Grandchild, MPT ?Physical Therapist with [place] ?[facility] ?[phone] office ?[phone] mobile phone ? ? ?

## 2021-05-09 NOTE — Progress Notes (Addendum)
?PROGRESS NOTE ? ? ? ?William Flores  VQM:[id number] DOB: 05-Sep-1950 DOA: 05/04/2021 ?PCP: [name], [name], MD  ? ?  ?Brief Narrative:  ?William Flores is a 71 year old male with multiple medical comorbidities including ESRD on HD TTS, right-sided heart failure, hypotension, hypothyroidism, Nash-liver cirrhosis, chronic anemia, cytopenia, hypokalemia, DM II, A-fib on Eliquis, obesity who presents from [facility] ([place] with complaint of nausea, vomiting, and generalized weakness that started yesterday.  Due to his symptoms, patient did not undergo dialysis as scheduled on 5/2.  In the emergency department, patient met sepsis criteria, concerning for multifocal pneumonia.  Patient was started on IV antibiotics and admitted to the hospital.  Nephrology, cardiology, pulmonology, palliative care consulted. ? ?Hospitalization has been complicated by respiratory failure, requiring high flow oxygen.  Episode of hematemesis and blood loss anemia requiring blood transfusion.  Altered mental status.  Hypotension requiring Levophed. ? ?New events last 24 hours / Subjective: ?Patient is fatigued but alert and conversational.  Underwent dialysis last night without issue.  Remains on Levophed today.  Discussed with him his critical illness, poor prognosis, hospice.  When asked about how he would feel about stopping dialysis and transitioning to hospice he tells me "I don't know" ? ?I had a conversation over the phone with step-daughter, William Flores, who I have been updating daily.  Patient remains with very poor prognosis overall.  Remains on Levophed, has been on Levophed for 4 days, unable to wean.  At this point, it is unlikely that patient would be able to tolerate outpatient dialysis.  Recommended to her to discuss end-of-life, comfort care, hospice with patient today.  Palliative care, nephrology, cardiology services will be back on Monday.  Can see if they have any further recommendations, but I suspect that recommendation would  be to transition to hospice.  His prognosis is grave, likely days.  ? ?Addendum -- met with step-daughter William Flores and his ex-wife William Flores at bedside. Patient is unmarried, has a son who is not involved in his life in a meaningful way. We again discussed patient's poor condition. Toward end of discussion, patient states that he would like to continue dialysis for now. He is not ready to stop dialysis and transition to comfort care. He wants to get better and get back to his LTC facility. We discussed the limitation of HD as he remains on levophed. We will continue to wean him off levophed as tolerated. His next HD should be Tuesday. I discussed that in event that levophed dosing has to be increased, or if he remains unstable for HD, we would likely at that point recommend hospice. His diet is advanced, but he has no appetite, afraid he will vomit.  ? ?Assessment & Plan: ?Principal Problem: ?  Septic shock ([place]) ?Active Problems: ?  ESRD (end stage renal disease) on dialysis [facility]) ?  Hypotension ?  Elevated troponin ?  Chronic right heart failure (HCC) ?  OSA (obstructive sleep apnea) ?  Atrial fibrillation ([place]) ?  Chronic respiratory failure with hypoxia (HCC) ?  Gout ?  History of colon cancer ?  DM (diabetes mellitus), type 2 with renal complications ([place]) ?  Mixed hyperlipidemia ?  Iron deficiency anemia ?  Liver cirrhosis secondary to NASH [facility]) ?  Morbid obesity ([place]) ?  Acute respiratory failure with hypoxia ([place]) ?  Nonischemic cardiomyopathy ([place]) ?  Pulmonary hypertension, unspecified ([place]) ?  GERD (gastroesophageal reflux disease) ?  Nausea and vomiting ?  Pain of upper abdomen ?  Constipation ?  Ischemic hepatitis ?  Hematemesis ? ? ?Septic shock secondary to multifocal pneumonia ?-He does have chronic hypotension on midodrine ?-MRSA PCR negative, stop vanco ?-Continue cefepime ?-Remains on levophed ?-Continue midodrine.  Add Solu-Cortef to see if we can wean off of Levophed ? ?Acute on chronic hypoxemic  respiratory failure ?-Requires 2 L nasal cannula O2 at baseline ?-Insetting of multifocal pneumonia, missed dialysis sessions ?-CXR showing worsening findings of CHF or fluid overload, increased moderate pleural effusions, persistent left basilar consolidation with bilateral increased opacities in the right-greater-than-left consistent with edema, ?pneumonia or combination - volume management by HD ?-Appreciate PCCM  ? ?Hematemesis, acute blood loss anemia ?-Transfused 2 unit packed red blood cells 5/3  ?-PPI ?-Hold Eliquis/IV heparin ?-GI following  ?-No plans for endoscopy currently.  Advance diet. ? ?ESRD on HD TTS ?-Nephrology following ?-HD 5/4, 5/6 ?-Has required levophed while on HD   ? ?Acute metabolic encephalopathy ?-Waxing and waning ? ?Chronic A-fib ?-Eliquis/IV heparin on hold due to hematemesis ?-Metoprolol on hold due to hypotension ? ?Chronic diastolic heart failure/RV failure  ?NICM ?-Volume management via hemodialysis ?-Cardiology following ? ?Pulmonary hypertension ?-Sildenafil ? ?NASH cirrhosis with elevated LFTs ?-Lactulose, increased dose. Added xifaxan  ? ?Nausea, vomiting, abdominal pain ?-CT abdomen pelvis revealed possible acute pancreatitis, lipase 19 ?-Symptoms resolved  ? ?Diabetes mellitus type 2 with renal complications ?-A3E 4.3 ?-Continue SSI ? ?Demand ischemia ?-In setting of hypotension, ESRD ?-Echocardiogram showed EF 60 to 65%, no regional wall motion abnormality, RV pressure overload with reduced RV systolic function ? ?Hypothyroidism ?-Synthroid  ? ?HLD ?-Lipitor  ? ? ?DVT prophylaxis:  ?TED hose Start: 05/04/21 1424 ?SCDs Start: 05/04/21 1424 ? ?Code Status: Full ?Family Communication: updated daughter by phone and bedside 5/7 ?Disposition Plan:  ?Status is: Inpatient ?Remains inpatient appropriate because: critically ill  ? ?Consultants:  ?GI ?Cardiology ?Nephrology ?Palliative care ?PCCM  ? ? ?Antimicrobials:  ?Anti-infectives (From admission, onward)  ? ? Start      Dose/Rate Route Frequency Ordered Stop  ? 05/09/21 1130  rifaximin (XIFAXAN) tablet 550 mg       ? 550 mg Oral 2 times daily 05/09/21 1042    ? 05/06/21 1200  vancomycin (VANCOCIN) IVPB 1000 mg/200 mL premix  Status:  Discontinued       ? 1,000 mg ?200 mL/hr over 60 Minutes Intravenous Every T-Th-Sa (Hemodialysis) 05/04/21 1531 05/05/21 1348  ? 05/05/21 1800  ceFEPIme (MAXIPIME) 1 g in sodium chloride 0.9 % 100 mL IVPB       ? 1 g ?200 mL/hr over 30 Minutes Intravenous Every 24 hours 05/04/21 1438    ? 05/05/21 1200  ceFEPIme (MAXIPIME) 2 g in sodium chloride 0.9 % 100 mL IVPB  Status:  Discontinued       ? 2 g ?200 mL/hr over 30 Minutes Intravenous Every 24 hours 05/04/21 1219 05/04/21 1438  ? 05/04/21 1500  vancomycin (VANCOREADY) IVPB 2000 mg/400 mL       ? 2,000 mg ?200 mL/hr over 120 Minutes Intravenous  Once 05/04/21 1428 05/04/21 1827  ? 05/04/21 1430  metroNIDAZOLE (FLAGYL) IVPB 500 mg  Status:  Discontinued       ? 500 mg ?100 mL/hr over 60 Minutes Intravenous Every 12 hours 05/04/21 1428 05/06/21 1455  ? 05/04/21 1200  ceFEPIme (MAXIPIME) 2 g in sodium chloride 0.9 % 100 mL IVPB       ? 2 g ?200 mL/hr over 30 Minutes Intravenous  Once 05/04/21 1155 05/04/21 1315  ? 05/04/21 1200  metroNIDAZOLE (FLAGYL) IVPB 500 mg       ? 500 mg ?100 mL/hr over 60 Minutes Intravenous  Once 05/04/21 1155 05/04/21 1422  ? ?  ? ? ? ?Objective: ?Vitals:  ? 05/09/21 1100 05/09/21 1115 05/09/21 1130 05/09/21 1145  ?BP: (!) 104/59 (!) 105/52 (!) 106/57 108/64  ?Pulse:      ?Resp: 14 18 (!) 21 13  ?Temp:      ?TempSrc:      ?SpO2:      ?Weight:      ?Height:      ? ? ?Intake/Output Summary (Last 24 hours) at 05/09/2021 1202 ?Last data filed at 05/09/2021 1156 ?Gross per 24 hour  ?Intake 617.37 ml  ?Output 3325 ml  ?Net -2707.63 ml  ? ? ?Filed Weights  ? 05/08/21 2045 05/09/21 0045 05/09/21 0500  ?Weight: 115.8 kg 112.8 kg 112.8 kg  ? ? ?Examination: ?General exam: Appears calm, but fatigued ?Respiratory system: Clear to auscultation  anteriorly. Respiratory effort normal. On [place] O2  ?Cardiovascular system: S1 & S2 heard, RRR. + pedal edema. ?Gastrointestinal system: Abdomen is mildly distended, soft and nontender.  ?Central nervous system: Al

## 2021-05-10 DIAGNOSIS — R195 Other fecal abnormalities: Secondary | ICD-10-CM

## 2021-05-10 DIAGNOSIS — I272 Pulmonary hypertension, unspecified: Secondary | ICD-10-CM

## 2021-05-10 DIAGNOSIS — Z66 Do not resuscitate: Secondary | ICD-10-CM | POA: Diagnosis not present

## 2021-05-10 DIAGNOSIS — R778 Other specified abnormalities of plasma proteins: Secondary | ICD-10-CM

## 2021-05-10 DIAGNOSIS — Z515 Encounter for palliative care: Secondary | ICD-10-CM | POA: Diagnosis not present

## 2021-05-10 DIAGNOSIS — Z7189 Other specified counseling: Secondary | ICD-10-CM | POA: Diagnosis not present

## 2021-05-10 DIAGNOSIS — D509 Iron deficiency anemia, unspecified: Secondary | ICD-10-CM

## 2021-05-10 DIAGNOSIS — I50812 Chronic right heart failure: Secondary | ICD-10-CM

## 2021-05-10 DIAGNOSIS — K7460 Unspecified cirrhosis of liver: Secondary | ICD-10-CM

## 2021-05-10 DIAGNOSIS — Z85038 Personal history of other malignant neoplasm of large intestine: Secondary | ICD-10-CM

## 2021-05-10 DIAGNOSIS — I959 Hypotension, unspecified: Secondary | ICD-10-CM | POA: Diagnosis not present

## 2021-05-10 DIAGNOSIS — K7200 Acute and subacute hepatic failure without coma: Secondary | ICD-10-CM

## 2021-05-10 DIAGNOSIS — K920 Hematemesis: Secondary | ICD-10-CM

## 2021-05-10 DIAGNOSIS — R6521 Severe sepsis with septic shock: Secondary | ICD-10-CM | POA: Diagnosis not present

## 2021-05-10 DIAGNOSIS — A419 Sepsis, unspecified organism: Secondary | ICD-10-CM | POA: Diagnosis not present

## 2021-05-10 DIAGNOSIS — I428 Other cardiomyopathies: Secondary | ICD-10-CM

## 2021-05-10 DIAGNOSIS — I4891 Unspecified atrial fibrillation: Secondary | ICD-10-CM

## 2021-05-10 DIAGNOSIS — K7581 Nonalcoholic steatohepatitis (NASH): Secondary | ICD-10-CM

## 2021-05-10 DIAGNOSIS — J9611 Chronic respiratory failure with hypoxia: Secondary | ICD-10-CM

## 2021-05-10 LAB — CBC
HCT: 37.1 % — ABNORMAL LOW (ref 39.0–52.0)
Hemoglobin: 10.6 g/dL — ABNORMAL LOW (ref 13.0–17.0)
MCH: 30.1 pg (ref 26.0–34.0)
MCHC: 28.6 g/dL — ABNORMAL LOW (ref 30.0–36.0)
MCV: 105.4 fL — ABNORMAL HIGH (ref 80.0–100.0)
Platelets: 62 10*3/uL — ABNORMAL LOW (ref 150–400)
RBC: 3.52 MIL/uL — ABNORMAL LOW (ref 4.22–5.81)
RDW: 25.9 % — ABNORMAL HIGH (ref 11.5–15.5)
WBC: 3.9 10*3/uL — ABNORMAL LOW (ref 4.0–10.5)
nRBC: 1.8 % — ABNORMAL HIGH (ref 0.0–0.2)

## 2021-05-10 LAB — GLUCOSE, CAPILLARY
Glucose-Capillary: 178 mg/dL — ABNORMAL HIGH (ref 70–99)
Glucose-Capillary: 168 mg/dL — ABNORMAL HIGH (ref 70–99)
Glucose-Capillary: 177 mg/dL — ABNORMAL HIGH (ref 70–99)
Glucose-Capillary: 156 mg/dL — ABNORMAL HIGH (ref 70–99)

## 2021-05-10 LAB — COMPREHENSIVE METABOLIC PANEL
ALT: 51 U/L — ABNORMAL HIGH (ref 0–44)
AST: 25 U/L (ref 15–41)
Albumin: 2.8 g/dL — ABNORMAL LOW (ref 3.5–5.0)
Alkaline Phosphatase: 62 U/L (ref 38–126)
Anion gap: 12 (ref 5–15)
BUN: 28 mg/dL — ABNORMAL HIGH (ref 8–23)
CO2: 21 mmol/L — ABNORMAL LOW (ref 22–32)
Calcium: 8.5 mg/dL — ABNORMAL LOW (ref 8.9–10.3)
Chloride: 100 mmol/L (ref 98–111)
Creatinine, Ser: 5.89 mg/dL — ABNORMAL HIGH (ref 0.61–1.24)
GFR, Estimated: 10 mL/min — ABNORMAL LOW (ref >60)
Glucose, Bld: 192 mg/dL — ABNORMAL HIGH (ref 70–99)
Potassium: 4.5 mmol/L (ref 3.5–5.1)
Sodium: 133 mmol/L — ABNORMAL LOW (ref 135–145)
Total Bilirubin: 1.8 mg/dL — ABNORMAL HIGH (ref 0.3–1.2)
Total Protein: 6.4 g/dL — ABNORMAL LOW (ref 6.5–8.1)

## 2021-05-10 LAB — PROTIME-INR
INR: 1.5 — ABNORMAL HIGH (ref 0.8–1.2)
Prothrombin Time: 18.4 seconds — ABNORMAL HIGH (ref 11.4–15.2)

## 2021-05-10 MED ORDER — LACTULOSE 10 GM/15ML PO SOLN
30.0000 g | Freq: Two times a day (BID) | ORAL | Status: DC
  Administered 2021-05-11 – 2021-05-16 (×9): 30 g via ORAL
  Filled 2021-05-10 (×10): qty 60

## 2021-05-10 MED ORDER — OXYCODONE HCL 5 MG PO TABS
5.0000 mg | ORAL_TABLET | Freq: Four times a day (QID) | ORAL | Status: DC | PRN
  Administered 2021-05-10: 5 mg via ORAL
  Filled 2021-05-10 (×2): qty 1

## 2021-05-10 MED ORDER — MIDODRINE HCL 5 MG PO TABS
20.0000 mg | ORAL_TABLET | Freq: Three times a day (TID) | ORAL | Status: DC
  Administered 2021-05-10 – 2021-05-16 (×19): 20 mg via ORAL
  Filled 2021-05-10 (×19): qty 4

## 2021-05-10 MED ORDER — HYDROCORTISONE SOD SUC (PF) 250 MG IJ SOLR
125.0000 mg | Freq: Two times a day (BID) | INTRAMUSCULAR | Status: DC
  Administered 2021-05-10 – 2021-05-16 (×12): 125 mg via INTRAVENOUS
  Filled 2021-05-10 (×16): qty 125

## 2021-05-10 MED ORDER — HYDROMORPHONE HCL 1 MG/ML IJ SOLN
0.5000 mg | INTRAMUSCULAR | Status: DC | PRN
  Administered 2021-05-10 – 2021-05-11 (×2): 1 mg via INTRAVENOUS
  Filled 2021-05-10 (×2): qty 1

## 2021-05-10 MED ORDER — HYDROCORTISONE SOD SUC (PF) 250 MG IJ SOLR
100.0000 mg | Freq: Three times a day (TID) | INTRAMUSCULAR | Status: DC
  Filled 2021-05-10 (×7): qty 100

## 2021-05-10 MED ORDER — SODIUM CHLORIDE 0.9 % IV SOLN
1.0000 g | INTRAVENOUS | Status: AC
  Administered 2021-05-10: 1 g via INTRAVENOUS
  Filled 2021-05-10: qty 10

## 2021-05-10 NOTE — Progress Notes (Signed)
?PROGRESS NOTE ? ? ? ?William Flores  ZOX:[id number] DOB: 05-04-1950 DOA: 05/04/2021 ?PCP: [name], [name], MD  ? ?  ?Brief Narrative:  ?William Flores is a 71 year old male with multiple medical comorbidities including ESRD on HD TTS, right-sided heart failure, hypotension, hypothyroidism, Nash-liver cirrhosis, chronic anemia, cytopenia, hypokalemia, DM II, A-fib on Eliquis, obesity who presents from [facility] with complaint of nausea, vomiting, and generalized weakness that started yesterday.  Due to his symptoms, patient did not undergo dialysis as scheduled on 5/2.  In the emergency department, patient met sepsis criteria, concerning for multifocal pneumonia.  Patient was started on IV antibiotics and admitted to the hospital.  Nephrology, cardiology, pulmonology, palliative care consulted. ? ?Hospitalization has been complicated by respiratory failure, requiring high flow oxygen.  Episode of hematemesis and blood loss anemia requiring blood transfusion.  Altered mental status.  Hypotension requiring Levophed. ? ?New events last 24 hours / Subjective: ?Patient is fatigued but alert and conversational.  Underwent dialysis last night without issue.  Remains on Levophed today.  Discussed with him his critical illness, poor prognosis, hospice.  When asked about how he would feel about stopping dialysis and transitioning to hospice he tells me "I don't know" ? ?I had a conversation over the phone with step-daughter, William Flores, who I have been updating daily.  Patient remains with very poor prognosis overall.  Remains on Levophed, has been on Levophed for 4 days, unable to wean.  At this point, it is unlikely that patient would be able to tolerate outpatient dialysis.  Recommended to her to discuss end-of-life, comfort care, hospice with patient today.  Palliative care, nephrology, cardiology services will be back on Monday.  Can see if they have any further recommendations, but I suspect that recommendation would  be to transition to hospice.  His prognosis is grave, likely days.  ? ?Appreciate assistance and recommendation by palliative care service.  Patient with very poor prognosis and overall guarded outcome.  Has continued to require pressure support just to maintain blood pressure.  Very limited options for treatment and essentially not tolerating hemodialysis for volume management at this point. ? ?Assessment & Plan: ?Principal Problem: ?  Septic shock ([place]) ?Active Problems: ?  ESRD (end stage renal disease) on dialysis [facility]) ?  Hypotension ?  Elevated troponin ?  Chronic right heart failure (HCC) ?  OSA (obstructive sleep apnea) ?  Atrial fibrillation ([place]) ?  Chronic respiratory failure with hypoxia (HCC) ?  Gout ?  History of colon cancer ?  DM (diabetes mellitus), type 2 with renal complications ([place]) ?  Mixed hyperlipidemia ?  Iron deficiency anemia ?  Liver cirrhosis secondary to NASH [facility]) ?  Morbid obesity ([place]) ?  Acute respiratory failure with hypoxia ([place]) ?  Nonischemic cardiomyopathy ([place]) ?  Pulmonary hypertension, unspecified ([place]) ?  GERD (gastroesophageal reflux disease) ?  Nausea and vomiting ?  Pain of upper abdomen ?  Constipation ?  Ischemic hepatitis ?  Hematemesis ? ? ?Septic shock secondary to multifocal pneumonia ?-He does have chronic hypotension on midodrine (specially around hemodialysis treatment). ?-MRSA PCR negative ?-Patient has completed IV antibiotics while inpatient. ?-Continue midodrine on the use of Solu-Cortef. ?-Patient has continued to require pressor support to maintain blood pressure. ?-Condition is critical, overall poor prognosis and guarded outcome. ? ?Acute on chronic hypoxemic respiratory failure ?-Chronically using 2 L nasal cannula O2 at baseline ?-Insetting of multifocal pneumonia, missed dialysis sessions and vascular congestion/interstitial edema due to right-sided heart failure. ?-CXR  showing worsening findings of CHF or fluid overload, increased moderate pleural  effusions, persistent left basilar consolidation with bilateral increased opacities in the right-greater-than-left consistent with edema, ?pneumonia or combination ?-Continue volume management per hemodialysis as tolerated ?-Patient has completed a total of 7 days of antibiotics and is currently afebrile. ?-Will hold off on further antibiotic therapy at this point. ? ?Hematemesis, acute blood loss anemia ?-Transfused 2 unit packed red blood cells 5/3  ?-Continue PPI ?-Continue to hold Eliquis/IV heparin ?-Appreciate assistance/recommendations by GI service.    ?-No plans for endoscopy currently.   ? ?ESRD on HD TTS ?-Nephrology following ?-HD 5/4, 5/6; with plans to attempt hemodialysis again on 05/11/2021. ?-Has required levophed on previous hemodialysis treatment. ?-Continue midodrine and Solu-Cortef. ? ?Acute metabolic encephalopathy ?-Waxing and waning ?-Oriented x3 on today's evaluation. ?-Continue constant reorientation. ? ?Chronic A-fib ?-Eliquis/IV heparin on hold due to hematemesis ?-Metoprolol on hold due to hypotension ?-Rate controlled currently. ? ?Chronic diastolic heart failure/RV failure  ?NICM ?-Volume management via hemodialysis ?-Cardiology has signed off at this point at this no other treatment that they can offer him. ?-Recommended transition to hospice and comfort care if unable to tolerate dialysis without pressor support. ?-Continue to follow daily weights/strict I's and O's. ? ?Pulmonary hypertension ?-Continue sildenafil ? ?NASH cirrhosis with elevated LFTs ?-Lactulose dose has been increased ?-Continue rifaximin ?-Mentation is normal. ?-Will follow recommendations by GI; overall very poor prognosis ?-Concerned that his elevated LFTs are secondary to hepatic congestion. ? ?Nausea, vomiting, abdominal pain ?-CT abdomen pelvis revealed possible acute pancreatitis, lipase 19 ?-Having nausea/vomiting without abdominal pain today. ? ?Diabetes mellitus type 2 with renal complications ?-Z7Q  4.3 ?-Continue SSI ?-Follow CBGs with further adjustment to hypoglycemic regimen as needed. ? ?Demand ischemia ?-In setting of hypotension, ESRD ?-Echocardiogram showed EF 60 to 65%, no regional wall motion abnormality, RV pressure overload with reduced RV systolic function; going along with severe pulmonary hypertension and right heart failure. ?-Cardiology service recommending transition to hospice and comfort care if unable to tolerate dialysis without pressors. ? ?Hypothyroidism ?-Continue Synthroid  ? ?HLD ?-Continue Lipitor  ? ? ?DVT prophylaxis:  ?TED hose Start: 05/04/21 1424 ?SCDs Start: 05/04/21 1424 ? ?Code Status: Full ?Family Communication: Patient has decided to hold on updating family until after dialysis treatment is attempted on 05/11/2021. ? ?Disposition Plan:  ?Status is: Remains inpatient ? ?Remains inpatient appropriate because: critically ill; with overall poor prognosis and guarded condition currently. ? ?Consultants:  ?GI ?Cardiology ?Nephrology ?Palliative care ?PCCM  ? ? ?Antimicrobials:  ?Anti-infectives (From admission, onward)  ? ? Start     Dose/Rate Route Frequency Ordered Stop  ? 05/10/21 1800  ceFEPIme (MAXIPIME) 1 g in sodium chloride 0.9 % 100 mL IVPB       ? 1 g ?200 mL/hr over 30 Minutes Intravenous Every 24 hours 05/10/21 1207 05/10/21 1737  ? 05/09/21 1130  rifaximin (XIFAXAN) tablet 550 mg       ? 550 mg Oral 2 times daily 05/09/21 1042    ? 05/06/21 1200  vancomycin (VANCOCIN) IVPB 1000 mg/200 mL premix  Status:  Discontinued       ? 1,000 mg ?200 mL/hr over 60 Minutes Intravenous Every T-Th-Sa (Hemodialysis) 05/04/21 1531 05/05/21 1348  ? 05/05/21 1800  ceFEPIme (MAXIPIME) 1 g in sodium chloride 0.9 % 100 mL IVPB  Status:  Discontinued       ? 1 g ?200 mL/hr over 30 Minutes Intravenous Every 24 hours 05/04/21 1438 05/10/21 1207  ? 05/05/21 1200  ceFEPIme (MAXIPIME) 2 g in sodium chloride 0.9 % 100 mL IVPB  Status:  Discontinued       ? 2 g ?200 mL/hr over 30 Minutes  Intravenous Every 24 hours 05/04/21 1219 05/04/21 1438  ? 05/04/21 1500  vancomycin (VANCOREADY) IVPB 2000 mg/400 mL       ? 2,000 mg ?200 mL/hr over 120 Minutes Intravenous  Once 05/04/21 1428 05/04/21 1827  ? 05/04/21

## 2021-05-10 NOTE — Progress Notes (Signed)
Progress Note  Patient Name: William Flores Date of Encounter: 05/10/2021  [facility] Cardiologist: Rollene Rotunda, MD    Subjective   Numb all over. Says he tolerated dialysis on Sat.  Inpatient Medications    Scheduled Meds:  allopurinol  200 mg Oral Daily   atorvastatin  20 mg Oral QHS   Chlorhexidine Gluconate Cloth  6 each Topical Daily   Chlorhexidine Gluconate Cloth  6 each Topical Q0600   docusate sodium  100 mg Oral BID   ferrous sulfate  325 mg Oral Q breakfast   hydrocortisone sod succinate (SOLU-CORTEF) inj  100 mg Intravenous Q8H   insulin aspart  0-6 Units Subcutaneous TID WC   lactulose  30 g Oral TID   levothyroxine  100 mcg Oral q morning   midodrine  10 mg Oral TID WC   nystatin cream   Topical BID   pantoprazole (PROTONIX) IV  40 mg Intravenous Q12H   rifaximin  550 mg Oral BID   sodium chloride flush  3 mL Intravenous Q12H   Continuous Infusions:  sodium chloride     sodium chloride     sodium chloride     sodium chloride     ceFEPime (MAXIPIME) IV 1 g (05/09/21 1728)   norepinephrine (LEVOPHED) Adult infusion 1 mcg/min (05/10/21 0900)   PRN Meds: sodium chloride, sodium chloride, sodium chloride, acetaminophen **OR** acetaminophen, alteplase, heparin, HYDROmorphone (DILAUDID) injection, ipratropium, levalbuterol, lidocaine (PF), lidocaine-prilocaine, ondansetron **OR** ondansetron (ZOFRAN) IV, oxyCODONE, pentafluoroprop-tetrafluoroeth, prochlorperazine, senna-docusate, sodium chloride flush, traMADol, traZODone   Vital Signs    Vitals:   05/10/21 0815 05/10/21 0830 05/10/21 0845 05/10/21 0900  BP: 111/63 113/68 121/60 112/78  Pulse:  93 86   Resp: 18 18 (!) 23 (!) 22  Temp:      TempSrc:      SpO2: 100% 100% 100%   Weight:      Height:        Intake/Output Summary (Last 24 hours) at 05/10/2021 0913 Last data filed at 05/10/2021 0900 Gross per 24 hour  Intake 729.98 ml  Output --  Net 729.98 ml      05/10/2021    5:00 AM 05/09/2021     5:00 AM 05/09/2021   12:45 AM  Last 3 Weights  Weight (lbs) 248 lb 10.9 oz 248 lb 10.9 oz 248 lb 10.9 oz  Weight (kg) 112.8 kg 112.8 kg 112.8 kg      Telemetry    Afib with PVC's - Personally Reviewed  ECG       Physical Exam    GEN: No acute distress.   Neck: increased JVD Cardiac: RRR, no murmurs, rubs, or gallops.  Respiratory: Clear to auscultation bilaterally. GI: distended  MS: plus 1-2 edema; No deformity. Neuro:  Nonfocal  Psych: Normal affect   Labs    High Sensitivity Troponin:   Recent Labs  Lab 05/04/21 1228 05/04/21 1426  TROPONINIHS 417* 413*     Chemistry Recent Labs  Lab 05/05/21 0750 05/06/21 0410 05/07/21 0436 05/07/21 1417 05/08/21 0429 05/09/21 0520 05/10/21 0430  NA 136 136 133* 133* 132* 134* 133*  K 4.8 5.3* 4.0 4.3 4.1 3.4* 4.5  CL 103 105 101 100 99 99 100  CO2 19* 18* 21* 23 23 26  21*  GLUCOSE 113* 141* 130* 130* 135* 128* 192*  BUN 47* 56* 32* 35* 38* 20 28*  CREATININE 7.19* 7.94* 5.37* 6.02* 6.42* 4.60* 5.89*  CALCIUM 8.4* 8.3* 7.8* 7.9* 8.0* 8.2* 8.5*  MG 2.3  --   --   --   --   --   --   PROT 6.6 6.4* 6.1*  --   --   --  6.4*  ALBUMIN 3.2* 2.9* 2.7* 2.5*  --   --  2.8*  AST 374* 149* 67*  --   --   --  25  ALT 266* 185* 120*  --   --   --  51*  ALKPHOS 77 73 66  --   --   --  62  BILITOT 1.7* 1.5* 1.5*  --   --   --  1.8*  GFRNONAA 8* 7* 11* 9* 9* 13* 10*  ANIONGAP 14 13 11 10 10 9 12     Lipids No results for input(s): CHOL, TRIG, HDL, LABVLDL, LDLCALC, CHOLHDL in the last 168 hours.  Hematology Recent Labs  Lab 05/08/21 0429 05/09/21 0520 05/10/21 0430  WBC 6.3 5.7 3.9*  RBC 3.27* 3.41* 3.52*  HGB 9.8* 10.1* 10.6*  HCT 34.0* 35.1* 37.1*  MCV 104.0* 102.9* 105.4*  MCH 30.0 29.6 30.1  MCHC 28.8* 28.8* 28.6*  RDW 25.5* 25.3* 25.9*  PLT 58* 51* 62*   Thyroid No results for input(s): TSH, FREET4 in the last 168 hours.  BNP Recent Labs  Lab 05/04/21 1228  BNP 1,371.0*    DDimer No results for input(s):  DDIMER in the last 168 hours.   Radiology    US Venous Img Lower Bilateral (DVT)  Result Date: 05/09/2021 CLINICAL DATA:  Bilateral lower extremity edema. History of colon cancer. Patient is currently on anticoagulation for atrial fibrillation. Evaluate for DVT. EXAM: BILATERAL LOWER EXTREMITY VENOUS DOPPLER ULTRASOUND TECHNIQUE: Gray-scale sonography with graded compression, as well as color Doppler and duplex ultrasound were performed to evaluate the lower extremity deep venous systems from the level of the common femoral vein and including the common femoral, femoral, profunda femoral, popliteal and calf veins including the posterior tibial, peroneal and gastrocnemius veins when visible. The superficial great saphenous vein was also interrogated. Spectral Doppler was utilized to evaluate flow at rest and with distal augmentation maneuvers in the common femoral, femoral and popliteal veins. COMPARISON:  None Available. FINDINGS: Examination is degraded secondary to patient's inability to tolerate and cooperate with the examination. RIGHT LOWER EXTREMITY Common Femoral Vein: No evidence of thrombus. Normal compressibility, respiratory phasicity and response to augmentation. Saphenofemoral Junction: No evidence of thrombus. Normal compressibility and flow on color Doppler imaging. Profunda Femoral Vein: No evidence of thrombus. Normal compressibility and flow on color Doppler imaging. Femoral Vein: No evidence of thrombus. Normal compressibility, respiratory phasicity and response to augmentation. Popliteal Vein: No evidence of thrombus. Normal compressibility, respiratory phasicity and response to augmentation. Calf Veins: No evidence of thrombus. Normal compressibility and flow on color Doppler imaging. Superficial Great Saphenous Vein: No evidence of thrombus. Normal compressibility. Venous Reflux:  None. Other Findings: There is a minimal amount of subcutaneous edema at the level of the right calf. LEFT  LOWER EXTREMITY Common Femoral Vein: No evidence of thrombus. Normal compressibility, respiratory phasicity and response to augmentation. Saphenofemoral Junction: No evidence of thrombus. Normal compressibility and flow on color Doppler imaging. Profunda Femoral Vein: No evidence of thrombus. Normal compressibility and flow on color Doppler imaging. Femoral Vein: No evidence of thrombus. Normal compressibility, respiratory phasicity and response to augmentation. Popliteal Vein: No evidence of thrombus. Normal compressibility, respiratory phasicity and response to augmentation. Calf Veins: No evidence of thrombus. Normal compressibility and flow on color Doppler imaging.  Superficial Great Saphenous Vein: No evidence of thrombus. Normal compressibility. Venous Reflux:  None. Other Findings:  None. IMPRESSION: No evidence of DVT within either lower extremity. Electronically Signed   By: Simonne Come M.D.   On: 05/09/2021 11:51    Cardiac Studies    Echo 05/04/21 IMPRESSIONS     1. Left ventricular ejection fraction, by estimation, is 60 to 65%. The  left ventricle has normal function. The left ventricle has no regional  wall motion abnormalities. Left ventricular diastolic parameters are  indeterminate.   2. Ventricular septum is flattened in systole consistent with RV pressure  overload. . Right ventricular systolic function is severely reduced. The  right ventricular size is severely enlarged. Tricuspid regurgitation  signal is inadequate for assessing PA   pressure.   3. Left atrial size was severely dilated.   4. Right atrial size was severely dilated.   5. The mitral valve is abnormal. Trivial mitral valve regurgitation. No  evidence of mitral stenosis. Moderate mitral annular calcification.   6. Tricuspid valve regurgitation not well visualized. not well visualized  tricuspid stenosis.   7. The aortic valve is tricuspid. There is moderate calcification of the  aortic valve. There is moderate  thickening of the aortic valve. Aortic  valve regurgitation is not visualized. No aortic stenosis is present.   8. The inferior vena cava is dilated in size with <50% respiratory  variability, suggesting right atrial pressure of 15 mmHg.   Patient Profile     71 y.o. male with a hx of chronic RV failure, pulmonary HTN, OSA/OHS, ESRD, recent GI bleed who is being seen 05/04/2021 for the evaluation of hypotension at the request of Dr Estell Harpin.    Assessment & Plan    1.Chronic diastolic HF/RV failure - 11/2020 echo: LVEF 55-60% no WMAs, indet diastolic function, D-septum, severe RV dysfunction and enlargement, severe pulm HTN, mild to mod MR,  05/04/21 echo: LVEF 60-65%, indet diastolic, Dseptum, severe RV dysfunction and enlargement, severe BAE - fluid is managed by HD-due tomorrow - levophed support as needed to support HD, if can't do dialysis tomorrow hospice will be recommended per hospitalist -Volume management per nephrology   2.Pulmonary HTN - followed in HF clinic - combined group II and III pulmonary HTN. Diastolic HF, OSA/OHS.  04/2019 RHC: mean PA 53, PCWP 23, CI 2.71 PVR 5 WU - has been on sildenafil per CHF clinic but this is on hold due to hypotension   3.Hypotension -some degree of chronic hypotension with soft blood pressure this morning102/56 mmHg - previously on midodrine at home, from CHF Jan 2023 note plan to change from daily to just as needed for HD - on presentation 80/53, MAP 62. Sepsis workup per primary team. He is on emperic abx.  - midodrine restarted yesterday 10 mg 3 times daily -Remains on nor epi for BP support during dialysis   4.ESRD -HD per nephrology, normally T,Th, Sat -Hemodialysis been limited due to hypotension from GI bleed -Palliative care is now involved and considering transition to comfort care.  The patient's been made DNR but wants to continue dialysis   5.Elevated LFTs - perhaps related to hypotension vs congestion - history of  Cirrhosis/NASH, though LFTs normal earlier this month   6. Pneumonia - abx per primary team   7. Afib rate controlled - anticoag on hold due to coffee ground emesis   8. Elevated troponin - in setting of fluid overload, hypotension - at this time think ACS less likely  9. History of GI bleed - admit earlier this month with GI bleed, anemia requiring transusion - patient with coffee ground emesis this admit, anticoag on hold - required 2 units pRBCs   Agree with palliative care consultation,  multiple advanced severe comorbidities with multiple acute severe medical conditions.       [facility] will sign off.   Medication Recommendations:    Other recommendations (labs, testing, etc):    Follow up as an outpatient:   Dr. Domingo Dimes schedule  For questions or updates, please contact [facility] Please consult [email or web address] for contact info under        Signed, Jacolyn Reedy, PA-C  05/10/2021, 9:13 AM

## 2021-05-10 NOTE — Progress Notes (Signed)
? ?Gastroenterology Progress Note  ? ?Referring Provider: Dr. Ahmadou Shelter ?Primary Care Physician:  [name], [name], MD ?Primary Gastroenterologist:  Dr. Jenetta Downer ? ?Patient ID: William Flores; [id number]; 05/22/1950  ? ? ?Subjective  ? ?Levophed on hold again starting at 0900. Vomiting when entering room. No abdominal pain. Multiple loose stools already this morning on lactulose, reported as black.  ? ? ?Objective  ? ?Vital signs in last 24 hours ?Temp:  [97.6 ?F (36.4 ?C)-98.4 ?F (36.9 ?C)] 98.2 ?F (36.8 ?C) (05/08 0800) ?Pulse Rate:  [72-102] 102 (05/08 0800) ?Resp:  [9-27] 20 (05/08 0800) ?BP: (81-131)/(42-115) 131/115 (05/08 0800) ?SpO2:  [92 %-100 %] 100 % (05/08 0800) ?Weight:  [112.8 kg] 112.8 kg (05/08 0500) ?Last BM Date : 05/08/21 ? ?Physical Exam ?General:   Alert and oriented, Acutely ill  ?Head:  Normocephalic and atraumatic. ?Abdomen:  Bowel sounds present, distended but soft, non-tender, obese. Umbilical hernia.  ?Extremities:  With 1-2+ edema. ?Neurologic:  Alert and  oriented x4 ? ?Intake/Output from previous day: ?05/07 0701 - 05/08 0700 ?In: 948.2 [P.O.:600; I.V.:248.2; IV Piggyback:100] ?Out: -  ?Intake/Output this shift: ?Total I/O ?In: 15 [I.V.:15] ?Out: -  ? ?Lab Results ? ?Recent Labs  ?  05/08/21 ?0429 05/09/21 ?1975 05/10/21 ?0430  ?WBC 6.3 5.7 3.9*  ?HGB 9.8* 10.1* 10.6*  ?HCT 34.0* 35.1* 37.1*  ?PLT 58* 51* 62*  ? ?BMET ?Recent Labs  ?  05/08/21 ?0429 05/09/21 ?8832 05/10/21 ?0430  ?NA 132* 134* 133*  ?K 4.1 3.4* 4.5  ?CL 99 99 100  ?CO2 23 26 21*  ?GLUCOSE 135* 128* 192*  ?BUN 38* 20 28*  ?CREATININE 6.42* 4.60* 5.89*  ?CALCIUM 8.0* 8.2* 8.5*  ? ?LFT ?Recent Labs  ?  05/07/21 ?1417 05/10/21 ?0430  ?PROT  --  6.4*  ?ALBUMIN 2.5* 2.8*  ?AST  --  25  ?ALT  --  51*  ?ALKPHOS  --  54  ?BILITOT  --  1.8*  ? ?PT/INR ?Recent Labs  ?  05/10/21 ?0430  ?LABPROT 18.4*  ?INR 1.5*  ? ?Hepatitis Panel ?Recent Labs  ?  05/07/21 ?1417  ?HEPBSAG NON REACTIVE  ? ? ? ?Studies/Results ?CT ABDOMEN PELVIS  WO CONTRAST ? ?Result Date: 05/05/2021 ?CLINICAL DATA:  Abdominal pain. EXAM: CT ABDOMEN AND PELVIS WITHOUT CONTRAST TECHNIQUE: Multidetector CT imaging of the abdomen and pelvis was performed following the standard protocol without IV contrast. RADIATION DOSE REDUCTION: This exam was performed according to the departmental dose-optimization program which includes automated exposure control, adjustment of the mA and/or kV according to patient size and/or use of iterative reconstruction technique. COMPARISON:  October 14, 2020 FINDINGS: Lower chest: Marked severity areas of consolidation are seen within the bilateral lung bases. There are small bilateral pleural effusions. Hepatobiliary: Nodularity of the liver contour is again seen. No focal liver abnormality is identified. No gallstones, gallbladder wall thickening, or biliary dilatation. Pancreas: Mild peripancreatic inflammatory fat stranding is seen. This is similar in appearance to the findings noted on the prior study. There is no evidence of pancreatic ductal dilatation. Spleen: Normal in size without focal abnormality. Adrenals/Urinary Tract: Adrenal glands are unremarkable. Bilateral renal cortical thinning is seen without renal calculi or hydronephrosis. Marked severity, predominately anterior urinary bladder wall thickening is seen with moderate severity surrounding inflammatory fat stranding. Stomach/Bowel: Stomach is within normal limits. The appendix is not identified. No evidence of bowel wall thickening, distention, or inflammatory changes. Vascular/Lymphatic: Aortic atherosclerosis. No enlarged abdominal or pelvic lymph nodes. Reproductive: The prostate gland  is mildly enlarged. Other: Marked severity anasarca is seen along the lateral aspects of the abdominal and pelvic walls. There is a mild amount of ascites. Musculoskeletal: A fracture deformity of indeterminate age is seen involving the L4 vertebral body. This represents a new finding when  compared to the prior study. Multilevel degenerative changes are seen throughout the lumbar spine. IMPRESSION: 1. Marked severity areas of bibasilar consolidation with small bilateral pleural effusions. 2. Bladder wall thickening which may represent sequelae associated with cystitis. Correlation with urinalysis is recommended. 3. Findings which may represent acute pancreatitis. Correlation with pancreatic enzymes is recommended. 4. Age-indeterminate fracture deformity involving the L4 vertebral body. This represents a new finding when compared to the prior study. Correlation with MRI is recommended. 5. Mild amount of ascites. 6. Aortic atherosclerosis. Aortic Atherosclerosis (ICD10-I70.0). Electronically Signed   By: Virgina Norfolk M.D.   On: 05/05/2021 01:19  ? ?US Abdomen Complete ? ?Result Date: 05/05/2021 ?CLINICAL DATA:  Abnormal LFTs. EXAM: ABDOMEN ULTRASOUND COMPLETE COMPARISON:  Same day CT of the abdomen and pelvis. FINDINGS: Gallbladder: Gallbladder wall thickening measuring up to 4 mm without dilation of the gallbladder may reflect sequela of chronic hepatocellular disease. No sonographic Murphy sign noted by sonographer. Common bile duct: Not identified Liver: No focal lesion identified. Hepatomegaly with nodular hepatic contour. Portal vein is patent on color Doppler imaging with normal direction of blood flow towards the liver. IVC: No abnormality visualized. Pancreas: Visualized portion unremarkable. Spleen: Size and appearance within normal limits. Right Kidney: Length: 11.1 cm. Echogenicity within normal limits. Nonobstructive renal calculi measure up to 8 mm. No mass or hydronephrosis visualized. Left Kidney: Not visualized, obscured by bowel gas and patient habitus Abdominal aorta: Not well visualized. Other findings: Trace ascites. IMPRESSION: 1. Hepatomegaly with nodular hepatic contour suggestive of cirrhosis. No focal liver lesion identified. 2. Gallbladder wall thickening without dilation of  the gallbladder likely reflects sequela of chronic hepatocellular disease. 3. Nonobstructive right renal calculi. 4. Trace ascites. Electronically Signed   By: Dahlia Bailiff M.D.   On: 05/05/2021 11:29  ? ?US Venous Img Lower Bilateral (DVT) ? ?Result Date: 05/09/2021 ?CLINICAL DATA:  Bilateral lower extremity edema. History of colon cancer. Patient is currently on anticoagulation for atrial fibrillation. Evaluate for DVT. EXAM: BILATERAL LOWER EXTREMITY VENOUS DOPPLER ULTRASOUND TECHNIQUE: Gray-scale sonography with graded compression, as well as color Doppler and duplex ultrasound were performed to evaluate the lower extremity deep venous systems from the level of the common femoral vein and including the common femoral, femoral, profunda femoral, popliteal and calf veins including the posterior tibial, peroneal and gastrocnemius veins when visible. The superficial great saphenous vein was also interrogated. Spectral Doppler was utilized to evaluate flow at rest and with distal augmentation maneuvers in the common femoral, femoral and popliteal veins. COMPARISON:  None Available. FINDINGS: Examination is degraded secondary to patient's inability to tolerate and cooperate with the examination. RIGHT LOWER EXTREMITY Common Femoral Vein: No evidence of thrombus. Normal compressibility, respiratory phasicity and response to augmentation. Saphenofemoral Junction: No evidence of thrombus. Normal compressibility and flow on color Doppler imaging. Profunda Femoral Vein: No evidence of thrombus. Normal compressibility and flow on color Doppler imaging. Femoral Vein: No evidence of thrombus. Normal compressibility, respiratory phasicity and response to augmentation. Popliteal Vein: No evidence of thrombus. Normal compressibility, respiratory phasicity and response to augmentation. Calf Veins: No evidence of thrombus. Normal compressibility and flow on color Doppler imaging. Superficial Great Saphenous Vein: No evidence of  thrombus. Normal compressibility. Venous Reflux:  None. Other Findings: There is a minimal amount of subcutaneous edema at the level of the right calf. LEFT LOWER EXTREMITY Common Femoral Vein: No evidence of

## 2021-05-10 NOTE — Progress Notes (Addendum)
?Daily Progress Note  ? ?Patient Name: William Flores       Date: 05/10/2021 ?DOB: 07/16/1950  Age: 71 y.o. MRN#: [id number] ?Attending Physician: Barton Dubois, MD ?Primary Care Physician: [name], [name], MD ?Admit Date: 05/04/2021 ?Length of Stay: 6 days ? ?Reason for Consultation/Follow-up: Establishing goals of care ? ?HPI/Patient Profile:  William Flores is a 71 year old male with multiple medical comorbidities including ESRD on HD TTS, right-sided heart failure, hypotension, hypothyroidism, Nash-liver cirrhosis, chronic anemia, cytopenia, hypokalemia, DM II, A-fib on Eliquis, obesity who presented from [facility] with complaint of nausea, vomiting, and generalized weakness that started yesterday.  He was admitted for septic shock due to multifocal pneumonia. Due to his symptoms and hypotension, patient did not undergo dialysis as scheduled on 5/2.  ? ?PMT consulted for [place] conversations. ? ?Hospitalization has been complicated by respiratory failure, requiring high flow oxygen. Episode of hematemesis and blood loss anemia requiring blood transfusion.  Altered mental status.  Hypotension requiring Levophed. Patient has indicated desire to continue HD, get better, back to life at [facility]. Aware of limitations with hypotension. Hospitalist states if remains on levophed or needs to be increased, would recommend hospice. ? ?Subjective:  ? ?Subjective: ?Chart Reviewed. Updates received. Patient Assessed. Created space and opportunity for patient  and family to explore thoughts and feelings regarding current medical situation. ? ?Today's Discussion: Today I met with the patient at the bedside.  He just had a significant amount of vomiting.  Noted loose stools this morning that were black.  His vomiting has ceased for now.  There is no family at the bedside. ? ?We had a very direct and empathetic conversation about his current status.  We reviewed his right heart failure/heart disease and cardiology's opinion that  there are no substantial treatment options left to improve this.  We discussed how the heart disease/heart failure is making his blood pressure low which limits his ability to tolerate dialysis/remove fluid which causes more fluid retention which worsens his heart failure; it is all a very vicious cycle. ? ?We discussed that he is still requiring Levophed to maintain adequate blood pressures.  Even with Levophed he has not been able to adequately tolerate sessions of iHD.  We discussed that Levophed is not a permanent option.  If he is unable to tolerate dialysis off Levophed then continuing dialysis may no longer be an option. ? ?I allowed him to share his thoughts and emotions about this.  He states that he feels "I am not going to make it".  He also expresses that he is not surprised by this information that I gave him today.   ? ?We discussed a tentative plan including a time-limited trial of continued attempted hemodialysis.  We discussed that nephrology is planning to again attempt dialysis tomorrow and see how he does.  However, if his blood pressure does not allow adequate treatment, no fluid able to be removed, and/or still requiring Levophed, then we will have to have further discussions and make decisions on how to move forward. ? ?I discussed with him that if continuing dialysis is not possible due to his worsening heart failure and inability to come off pressors and we can discuss other options including a shift in focus to comfort care.  I explained comfort care as shifting from aggressive intervention (including dialysis) and rather focusing on comfort, dignity, quality as we approach the end of life.  He expressed understanding of this concept.  He seems to be contemplative  about his options. ? ?By the time I left the room his nausea had improved with Zofran.  We agreed that I would follow-up with him tomorrow and see how he is doing. ? ?I expressed to him that no matter which way we go that  palliative medicine will be here to support him and his family through it all and that he would not be alone.  I asked him if he would like me to call and update his daughter and he requested that I hold off for now. ? ?I provided emotional general support through therapeutic listening, empathy, therapeutic touch, sharing of stories, and other techniques.  I answered all questions and addressed all concerns to the best my ability. ? ?Review of Systems  ?Constitutional:  Positive for fatigue.  ?Respiratory:  Negative for cough and shortness of breath.   ?Cardiovascular:  Positive for leg swelling.  ?Gastrointestinal:  Positive for nausea and vomiting. Negative for abdominal pain.  ?Musculoskeletal:  Positive for back pain.  ? ?Objective:  ? ?Vital Signs:  ?BP (!) 131/115   Pulse (!) 102   Temp 98.2 ?F (36.8 ?C) (Oral)   Resp 20   Ht 5' 4"  (1.626 m)   Wt 112.8 kg   SpO2 100%   BMI 42.69 kg/m?  ? ?Physical Exam: ?Physical Exam ?Vitals and nursing note reviewed.  ?Constitutional:   ?   General: He is not in acute distress. ?   Appearance: He is obese. He is ill-appearing.  ?HENT:  ?   Head: Normocephalic and atraumatic.  ?Cardiovascular:  ?   Rate and Rhythm: Normal rate.  ?Pulmonary:  ?   Effort: Pulmonary effort is normal. No respiratory distress.  ?   Breath sounds: No wheezing.  ?Abdominal:  ?   General: Abdomen is protuberant.  ?   Palpations: Abdomen is soft.  ?   Tenderness: There is no abdominal tenderness.  ?Skin: ?   General: Skin is warm and dry.  ?Neurological:  ?   General: No focal deficit present.  ?   Mental Status: He is alert.  ?Psychiatric:     ?   Attention and Perception: Attention normal.     ?   Mood and Affect: Affect is flat.     ?   Speech: Speech normal.     ?   Behavior: Behavior normal.     ?   Thought Content: Thought content normal.  ?   Comments: Seems contemplative, thinking about possibility of end of life  ? ?Today's MELD-NA: 28 ? ?Palliative Assessment/Data: 30% ? ? ?Assessment  & Plan:  ? ?Impression: ?Present on Admission: ? Septic shock ([place]) ? History of colon cancer ? OSA (obstructive sleep apnea) ? Gout ? Atrial fibrillation ([place]) ? DM (diabetes mellitus), type 2 with renal complications ([place]) ? Chronic respiratory failure with hypoxia ([facility]) ? Mixed hyperlipidemia ? Iron deficiency anemia ? (Resolved) Chronic renal failure ? Liver cirrhosis secondary to NASH [facility]) ? GERD (gastroesophageal reflux disease) ? Hypotension ? Morbid obesity ([place]) ? Elevated troponin ? ?71 year old male in a very precarious situation with severe pulmonary artery hypertension, right heart failure, end-stage renal disease on hemodialysis.  He was admitted with pneumonia and sepsis.  His sepsis has been treated and at this point it appears the genesis of his persistent blood pressure issues and need for pressors is around his right heart failure which cardiology does not feel there is much treatment to offer.  At this point he has not been tolerating  significant hemodialysis (had to miss a session a few days ago).  We discussed that long-term pressors were not an option.  Current plan is to reattempt hemodialysis tomorrow (per nephrology) and see how he does.  If no improvement from his current situation then we will need to discuss shifting toward more comfort focused care and discontinuation of hemodialysis.  He seems to understand.  Overall prognosis quite poor. ? ?SUMMARY OF RECOMMENDATIONS   ?Remain DNR ?Continue to treat the treatable for now ?Attend hemodialysis per nephrology note/recommendations tomorrow ?PMT will follow up and continue [place] discussions based on how he tolerates the next 24 to 48 hours ? ?Symptom Management:  ?Per primary team ?PMT is available to assist as needed ? ?Code Status: DNR ? ?Prognosis: < 2 weeks ? ?Discharge Planning: To Be Determined ? ?Discussed with: Medical team, nursing team, patient ? ?Thank you for allowing [place] to participate in the care of William Flores ?PMT will  continue to support holistically. ? ?Time Total: 75 min ? ?Visit consisted of counseling and education dealing with the complex and emotionally intense issues of symptom management and palliative care in the setting o

## 2021-05-10 NOTE — Progress Notes (Signed)
Admit: 05/04/2021 ?LOS: 6 ? ?27M ESRD THS [facility] with symptomatic anemia, sepsis, persistent hypotension req vasporessors, cirrhosis, FTT ? ?Subjective:  ?HD 5/6 with 3L UF ?On 26mg of NE this AM ?Also on midodrine 10 TID and hydrocortisone ?Says today goal is ot cont trying HD, similar to discussions over weekend in notes ?Hb 10.6, K 4.5  ? ?05/07 0701 - 05/08 0700 ?In: 948.2 [P.O.:600; I.V.:248.2; IV Piggyback:100] ?Out: -  ? ?Filed Weights  ? 05/09/21 0045 05/09/21 0500 05/10/21 0500  ?Weight: 112.8 kg 112.8 kg 112.8 kg  ? ? ?Scheduled Meds: ? allopurinol  200 mg Oral Daily  ? atorvastatin  20 mg Oral QHS  ? Chlorhexidine Gluconate Cloth  6 each Topical Daily  ? Chlorhexidine Gluconate Cloth  6 each Topical Q0600  ? docusate sodium  100 mg Oral BID  ? ferrous sulfate  325 mg Oral Q breakfast  ? hydrocortisone sod succinate (SOLU-CORTEF) inj  100 mg Intravenous Q8H  ? insulin aspart  0-6 Units Subcutaneous TID WC  ? lactulose  30 g Oral TID  ? levothyroxine  100 mcg Oral q morning  ? midodrine  10 mg Oral TID WC  ? nystatin cream   Topical BID  ? pantoprazole (PROTONIX) IV  40 mg Intravenous Q12H  ? rifaximin  550 mg Oral BID  ? sodium chloride flush  3 mL Intravenous Q12H  ? ?Continuous Infusions: ? sodium chloride    ? sodium chloride    ? sodium chloride    ? sodium chloride    ? ceFEPime (MAXIPIME) IV 1 g (05/09/21 1728)  ? norepinephrine (LEVOPHED) Adult infusion 1 mcg/min (05/10/21 0900)  ? ?PRN Meds:.sodium chloride, sodium chloride, sodium chloride, acetaminophen **OR** acetaminophen, alteplase, heparin, HYDROmorphone (DILAUDID) injection, ipratropium, levalbuterol, lidocaine (PF), lidocaine-prilocaine, ondansetron **OR** ondansetron (ZOFRAN) IV, oxyCODONE, pentafluoroprop-tetrafluoroeth, prochlorperazine, senna-docusate, sodium chloride flush, traMADol, traZODone ? ?Current Labs: reviewed  ? ? ?Physical Exam:  Blood pressure 104/63, pulse 86, temperature 98.2 ?F (36.8 ?C), temperature source Oral,  resp. rate 14, height 5' 4"  (1.626 m), weight 112.8 kg, SpO2 100 %. ?Chronically ill appearing, NAD, lying in bed ?IRIR no rub ?CTAB on an auscultation ?Trace LEE at most, chronic hyperpigmentation in distal LEs ?Protuberant mild distension, mild TTP  ? ?Dialysis Orders: [facility]?TTS 4hr EDW 108kg ?400/500 2/2.5 T35 ?Heparin 0 Mircera 2037m q2weeks (last 4/12) ?Left at 113.8kg on 4/20 ?AVF ? ?A ?ESRD THS  ?AoC hypotension, on midodrine 10 TID and NE ?Recent GIB, ABLA, Hb stable 10.6 ?Sepsis ? PNA, ABX per TRH ?Cirrhosis, GI following ?Pulm HTN on sildinafil, RV failure ?DM2 ?AFib ? ?P ?Patient hopes to continue dialysis if possible, tolerated 5/6 with goal UF ?Attempted dialysis 5/9: 2K, 3.5h, 400/600, UF 3L, AVF, no heparin ?Increase midodrine to 20 mg 3 times daily to help wean off norepinephrine ?Cont to discuss palliative option ?Medication Issues; ?Preferred narcotic agents for pain control are hydromorphone, fentanyl, and methadone. Morphine should not be used.  ?Baclofen should be avoided ?Avoid oral sodium phosphate and magnesium citrate based laxatives / bowel preps  ? ? ?RyPearson GrippeD ?05/10/2021, 9:46 AM ? ?Recent Labs  ?Lab 05/07/21 ?1417 05/08/21 ?0429 05/09/21 ?0555975/08/23 ?0430  ?NA 133* 132* 134* 133*  ?K 4.3 4.1 3.4* 4.5  ?CL 100 99 99 100  ?CO2 23 23 26  21*  ?GLUCOSE 130* 135* 128* 192*  ?BUN 35* 38* 20 28*  ?CREATININE 6.02* 6.42* 4.60* 5.89*  ?CALCIUM 7.9* 8.0* 8.2* 8.5*  ?PHOS 5.3*  --   --   --   ? ?  Recent Labs  ?Lab 05/04/21 ?1228 05/04/21 ?1236 05/07/21 ?0436 05/07/21 ?1417 05/08/21 ?0429 05/09/21 ?6825 05/10/21 ?0430  ?WBC 5.8   < > 5.6   < > 6.3 5.7 3.9*  ?NEUTROABS 4.2  --  3.6  --   --   --   --   ?HGB 11.2*   < > 10.5*   < > 9.8* 10.1* 10.6*  ?HCT 38.4*   < > 35.2*   < > 34.0* 35.1* 37.1*  ?MCV 110.0*   < > 103.2*   < > 104.0* 102.9* 105.4*  ?PLT 127*   < > 76*   < > 58* 51* 62*  ? < > = values in this interval not displayed.  ? ? ? ? ? ? ? ? ? ?  ?

## 2021-05-11 DIAGNOSIS — Z7189 Other specified counseling: Secondary | ICD-10-CM | POA: Diagnosis not present

## 2021-05-11 DIAGNOSIS — Z515 Encounter for palliative care: Secondary | ICD-10-CM | POA: Diagnosis not present

## 2021-05-11 DIAGNOSIS — I959 Hypotension, unspecified: Secondary | ICD-10-CM | POA: Diagnosis not present

## 2021-05-11 DIAGNOSIS — Z66 Do not resuscitate: Secondary | ICD-10-CM | POA: Diagnosis not present

## 2021-05-11 LAB — GLUCOSE, CAPILLARY
Glucose-Capillary: 177 mg/dL — ABNORMAL HIGH (ref 70–99)
Glucose-Capillary: 163 mg/dL — ABNORMAL HIGH (ref 70–99)
Glucose-Capillary: 116 mg/dL — ABNORMAL HIGH (ref 70–99)
Glucose-Capillary: 152 mg/dL — ABNORMAL HIGH (ref 70–99)

## 2021-05-11 LAB — HEPATITIS B SURFACE ANTIBODY, QUANTITATIVE: Hep B S AB Quant (Post): <3.1 m[IU]/mL — ABNORMAL LOW (ref >9.9)

## 2021-05-11 LAB — PROTIME-INR
INR: 1.5 — ABNORMAL HIGH (ref 0.8–1.2)
Prothrombin Time: 17.8 seconds — ABNORMAL HIGH (ref 11.4–15.2)

## 2021-05-11 MED ORDER — ALBUMIN HUMAN 25 % IV SOLN
INTRAVENOUS | Status: AC
  Administered 2021-05-11: 25 g
  Filled 2021-05-11: qty 200

## 2021-05-11 NOTE — Procedures (Signed)
Tx completed with intermittent hypotension, final 109/81. Net UF 3069m.  ? ?Administered 25g albumin at tx initiation, started pt on profile 2 with a goal of 2500 ml, 1 hour into treatment bp trended upwards, remained soft; goal increased to ordered uf of 30022m   ? ?Blood returned, no concerns at this time. ?

## 2021-05-11 NOTE — Plan of Care (Signed)

## 2021-05-11 NOTE — Progress Notes (Signed)
?PROGRESS NOTE ? ? ? ?William Flores  BJY:[id number] DOB: [birth year] DOA: 05/04/2021 ?PCP: [name], [name], MD  ? ?  ?Brief Narrative:  ?William Flores is a 71 year old male with multiple medical comorbidities including ESRD on HD TTS, right-sided heart failure, hypotension, hypothyroidism, Nash-liver cirrhosis, chronic anemia, cytopenia, hypokalemia, DM II, A-fib on Eliquis, obesity who presents from [facility] with complaint of nausea, vomiting, and generalized weakness that started yesterday.  Due to his symptoms, patient did not undergo dialysis as scheduled on 5/2.  In the emergency department, patient met sepsis criteria, concerning for multifocal pneumonia.  Patient was started on IV antibiotics and admitted to the hospital.  Nephrology, cardiology, pulmonology, palliative care consulted. ? ?Hospitalization has been complicated by respiratory failure, requiring high flow oxygen.  Episode of hematemesis and blood loss anemia requiring blood transfusion.  Altered mental status.  Hypotension requiring Levophed. ? ?New events last 24 hours / Subjective: ?Patient is fatigued but alert and conversational.  Underwent dialysis last night without issue.  Remains on Levophed today.  Discussed with him his critical illness, poor prognosis, hospice.  When asked about how he would feel about stopping dialysis and transitioning to hospice he tells me "I don't know" ? ?I had a conversation over the phone with step-daughter, [name], who I have been updating daily.  Patient remains with very poor prognosis overall.  Remains on Levophed, has been on Levophed for 4 days, unable to wean.  At this point, it is unlikely that patient would be able to tolerate outpatient dialysis.  Recommended to her to discuss end-of-life, comfort care, hospice with patient today.  Palliative care, nephrology, cardiology services will be back on Monday.  Can see if they have any further recommendations, but I suspect that recommendation would  be to transition to hospice.  His prognosis is grave, likely days.  ? ?05/11/21: Somnolent/sleepy but able to follow simple commands.  Currently off pressors and with at times later today for hemodialysis.  Follow clinical response.  Overall condition continues to be poor with very guarded outcome at this point.  Continue to follow assistance by nephrology service and palliative care. ? ?Assessment & Plan: ?Principal Problem: ?  Septic shock ([place]) ?Active Problems: ?  ESRD (end stage renal disease) on dialysis [facility]) ?  Hypotension ?  Elevated troponin ?  Chronic right heart failure (HCC) ?  OSA (obstructive sleep apnea) ?  Atrial fibrillation ([place]) ?  Chronic respiratory failure with hypoxia (HCC) ?  Gout ?  History of colon cancer ?  DM (diabetes mellitus), type 2 with renal complications ([place]) ?  Mixed hyperlipidemia ?  Iron deficiency anemia ?  Liver cirrhosis secondary to NASH [facility]) ?  Morbid obesity ([place]) ?  Acute respiratory failure with hypoxia ([place]) ?  Nonischemic cardiomyopathy ([place]) ?  Pulmonary hypertension, unspecified ([place]) ?  GERD (gastroesophageal reflux disease) ?  Nausea and vomiting ?  Pain of upper abdomen ?  Constipation ?  Ischemic hepatitis ?  Hematemesis ? ? ?Septic shock secondary to multifocal pneumonia ?-He does have chronic hypotension on midodrine (specially around hemodialysis treatment). ?-MRSA PCR negative ?-Patient has completed IV antibiotics while inpatient. ?-Continue midodrine on the use of Solu-Cortef. ?-Currently off Levophed; blood pressure marginal with a systolic blood pressure in the 90s.  Will assess patient tolerance of hemodialysis without pressors support. ?-Condition is critical, overall poor prognosis and guarded outcome. ? ?Acute on chronic hypoxemic respiratory failure ?-Chronically using 2 L nasal cannula O2 at baseline ?-Insetting of  multifocal pneumonia, missed dialysis sessions and vascular congestion/interstitial edema due to right-sided heart failure. ?-CXR  showing worsening findings of CHF or fluid overload, increased moderate pleural effusions, persistent left basilar consolidation with bilateral increased opacities in the right-greater-than-left consistent with edema, ?pneumonia or combination ?-Continue volume management per hemodialysis as tolerated ?-Patient has completed a total of 7 days of antibiotics and is currently afebrile. ?-Continue supportive care and wean off oxygen supplementation as tolerated. ?-Patient is afebrile. ? ?Hematemesis, acute blood loss anemia ?-Transfused 2 unit packed red blood cells 5/3  ?-Continue PPI ?-Continue to hold Eliquis/IV heparin ?-Appreciate assistance/recommendations by GI service.    ?-No plans for endoscopy currently.   ? ?ESRD on HD TTS ?-Nephrology following ?-HD 5/4, 5/6; next anticipated hemodialysis management plan for later today 05/11/2021; hemodialysis will be provided without any pressors to assess patient's tolerance. ?-Has required levophed on previous hemodialysis treatment. ?-Okay to continue midodrine and Solu-Cortef. ? ?Acute metabolic encephalopathy ?-Waxing and waning ?-Very somnolent and sleepy; but appears to be vented to situation and able to follow simple commands. ?-Continue constant reorientation and supportive care.. ? ?Chronic A-fib ?-Eliquis/IV heparin on hold due to hematemesis ?-Continue holding metoprolol in the setting of hypotension. ?-Rate controlled and stable currently. ? ?Chronic diastolic heart failure/RV failure  ?NICM ?-Continue volume management with hemodialysis; next treatment anticipated for later today 05/11/2021. ?-Cardiology has signed off at this point at this no other treatment that they can offer him. ?-Recommended transition to hospice and comfort care if unable to tolerate dialysis without pressor support. ?-Continue to follow daily weights/strict I's and O's. ? ?Pulmonary hypertension ?-Will continue the use of sildenafil. ? ?NASH cirrhosis with elevated LFTs ?-Lactulose dose  has been increased ?-Continue rifaximin ?-Mentation is normal. ?-Will follow recommendations by GI; overall very poor prognosis ?-Concerned that his elevated LFTs are secondary to hepatic congestion. ?-Continue to follow LFTs trend. ? ?Nausea, vomiting, abdominal pain ?-CT abdomen pelvis revealed possible acute pancreatitis, lipase 19 ?-No nausea or vomiting today; patient reports no abdominal pain currently. ?-Continue supportive care and follow clinical response. ? ?Diabetes mellitus type 2 with renal complications ?-H7C 4.3 ?-Continue SSI ?-Continue to follow CBGs with further adjustment to hypoglycemic regimen as needed. ? ?Demand ischemia ?-In setting of hypotension, ESRD ?-Echocardiogram showed EF 60 to 65%, no regional wall motion abnormality, RV pressure overload with reduced RV systolic function; going along with severe pulmonary hypertension and right heart failure. ?-Cardiology service recommending transition to hospice and comfort care if unable to tolerate dialysis without pressors. ?-Planning for hemodialysis later today; will follow response and tolerance. ?-Patient is currently off pressor support with marginal low blood pressure. ? ?Hypothyroidism ?-Continue Synthroid. ? ?HLD ?-Continue the use of Lipitor ? ? ?DVT prophylaxis:  ?TED hose Start: 05/04/21 1424 ?SCDs Start: 05/04/21 1424 ? ?Code Status: DNR ?Family Communication: No family at bedside. ? ?Disposition Plan:  ?Status is: Remains inpatient ? ?Remains inpatient appropriate because: Patient has remained critically ill; with overall poor prognosis and guarded condition. ? ?Consultants:  ?GI ?Cardiology ?Nephrology ?Palliative care ?PCCM  ? ? ?Antimicrobials:  ?Anti-infectives (From admission, onward)  ? ? Start     Dose/Rate Route Frequency Ordered Stop  ? 05/10/21 1800  ceFEPIme (MAXIPIME) 1 g in sodium chloride 0.9 % 100 mL IVPB       ? 1 g ?200 mL/hr over 30 Minutes Intravenous Every 24 hours 05/10/21 1207 05/10/21 1737  ? 05/09/21 1130   rifaximin (XIFAXAN) tablet 550 mg       ?  550 mg Oral 2 times daily 05/09/21 1042    ? 05/06/21 1200  vancomycin (VANCOCIN) IVPB 1000 mg/200 mL premix  Status:  Discontinued       ? 1,000 mg ?200 mL/hr ove

## 2021-05-11 NOTE — Progress Notes (Signed)
?Daily Progress Note  ? ?Patient Name: William Flores       Date: 05/11/2021 ?DOB: 1950-11-04  Age: 71 y.o. MRN#: [id number] ?Attending Physician: Barton Dubois, MD ?Primary Care Physician: [name], [name], MD ?Admit Date: 05/04/2021 ?Length of Stay: 7 days ? ?Reason for Consultation/Follow-up: Establishing goals of care ? ?HPI/Patient Profile:  William Flores is a 71 year old male with multiple medical comorbidities including ESRD on HD TTS, right-sided heart failure, hypotension, hypothyroidism, Nash-liver cirrhosis, chronic anemia, cytopenia, hypokalemia, DM II, A-fib on Eliquis, obesity who presented from [facility] with complaint of nausea, vomiting, and generalized weakness that started yesterday.  He was admitted for septic shock due to multifocal pneumonia. Due to his symptoms and hypotension, patient did not undergo dialysis as scheduled on 5/2.  ? ?PMT consulted for [place] conversations. ? ?Hospitalization has been complicated by respiratory failure, requiring high flow oxygen. Episode of hematemesis and blood loss anemia requiring blood transfusion.  Altered mental status.  Hypotension requiring Levophed. Patient has indicated desire to continue HD, get better, back to life at [place]. Aware of limitations with hypotension. Hospitalist states if remains on levophed or needs to be increased, would recommend hospice. ? ?Subjective:  ? ?Subjective: ?Chart Reviewed. Updates received. Patient Assessed. Created space and opportunity for patient  and family to explore thoughts and feelings regarding current medical situation. ? ?Today's Discussion: Today I met with the patient at the bedside.  When I met with him he was quite sleepy, not very conversational.  The hemodialysis nurse was setting up and stated she was about to give began hemodialysis.  Per nephrology orders okay to use albumin, remains on midodrine.  No Levophed for dialysis, if not tolerated then stop dialysis.  His blood pressure prior to starting  was in the mid 89F systolic.  We discussed that we are going to give dialysis to try but I explained to him that if he is not able to tolerate dialysis then they will stop.  At that point he would likely not be a candidate for dialysis moving forward.  He verbalized understanding.  I wished him the best with dialysis today and informed him I would check back later in the afternoon and again tomorrow morning. ? ?I provided emotional general support through therapeutic listening, empathy, therapeutic touch, sharing of stories, and other techniques.  I answered all questions and addressed all concerns to the best my ability. ? ?**ADDENDUM: I checked back on the patient approximately 2 hours later.  The hemodialysis nurse that she is given 1 dose of albumin, his blood pressure is holding steady, he had about 1 L pulled and she increase to goal to the ultimate 3 L.  The patient was sleeping at that time, SBP 108. ? ?Review of Systems  ?Constitutional:  Positive for fatigue.  ?Respiratory:  Negative for cough and shortness of breath.   ?Cardiovascular:  Positive for leg swelling.  ?Gastrointestinal:  Positive for nausea and vomiting. Negative for abdominal pain.  ?Musculoskeletal:  Positive for back pain.  ? ?Objective:  ? ?Vital Signs:  ?BP 111/65   Pulse 86   Temp (!) 96.2 ?F (35.7 ?C) (Oral)   Resp 12   Ht 5' 4"  (1.626 m)   Wt 112.7 kg   SpO2 100%   BMI 42.65 kg/m?  ? ?Physical Exam: ?Physical Exam ?Vitals and nursing note reviewed.  ?Constitutional:   ?   General: He is not in acute distress. ?   Appearance: He is obese. He is  ill-appearing.  ?HENT:  ?   Head: Normocephalic and atraumatic.  ?Cardiovascular:  ?   Rate and Rhythm: Normal rate.  ?Pulmonary:  ?   Effort: Pulmonary effort is normal. No respiratory distress.  ?   Breath sounds: No wheezing.  ?Abdominal:  ?   General: Abdomen is protuberant.  ?   Palpations: Abdomen is soft.  ?   Tenderness: There is no abdominal tenderness.  ?Skin: ?   General: Skin  is warm and dry.  ?Neurological:  ?   General: No focal deficit present.  ?   Mental Status: He is alert.  ?Psychiatric:     ?   Attention and Perception: Attention normal.     ?   Mood and Affect: Affect is flat.     ?   Speech: Speech normal.     ?   Behavior: Behavior normal.     ?   Thought Content: Thought content normal.  ?   Comments: Seems contemplative, thinking about possibility of end of life  ? ?Yesterday's MELD-NA: 28 ? ?Palliative Assessment/Data: 30% ? ? ?Assessment & Plan:  ? ?Impression: ?Present on Admission: ? Septic shock ([place]) ? History of colon cancer ? OSA (obstructive sleep apnea) ? Gout ? Atrial fibrillation ([place]) ? DM (diabetes mellitus), type 2 with renal complications ([place]) ? Chronic respiratory failure with hypoxia (HCC) ? Mixed hyperlipidemia ? Iron deficiency anemia ? (Resolved) Chronic renal failure ? Liver cirrhosis secondary to NASH [facility]) ? GERD (gastroesophageal reflux disease) ? Hypotension ? Morbid obesity ([place]) ? Elevated troponin ? ?71 year old male in a very precarious situation with severe pulmonary artery hypertension, right heart failure, end-stage renal disease on hemodialysis.  He was admitted with pneumonia and sepsis.  His sepsis has been treated and at this point it appears the genesis of his persistent blood pressure issues and need for pressors is around his right heart failure which cardiology does not feel there is much treatment to offer.  At this point he has not been tolerating significant hemodialysis (had to miss a session a few days ago).  We discussed that long-term pressors were not an option.  Current plan is to reattempt hemodialysis tomorrow (per nephrology) and see how he does.  If no improvement from his current situation then we will need to discuss shifting toward more comfort focused care and discontinuation of hemodialysis.  He seems to understand.  Overall prognosis quite poor.  ? ?SUMMARY OF RECOMMENDATIONS   ?Remain DNR ?Continue to treat the  treatable for now ?iHD scheduled for today and ongoing: maxed midodrine, ok albumin; no pressors ?Stop iHD if not tolerated (per nephrology) ?PMT will follow up and continue [place] discussions based on how he tolerates iHD; if doesn't tolerate, likely no further HD, transition to comfort/hospice ? ?Symptom Management:  ?Per primary team ?PMT is available to assist as needed ? ?Code Status: DNR ? ?Prognosis: < 2 weeks ? ?Discharge Planning: To Be Determined ? ?Discussed with: Medical team, nursing team, patient ? ?Thank you for allowing [place] to participate in the care of William Flores ?PMT will continue to support holistically. ? ?Billing based on MDM: High ? ?Problems Addressed: One acute or chronic illness or injury that poses a threat to life or bodily function ? ?Amount and/or Complexity of Data: Category 1:Review of prior external note(s) from each unique source and Review of the result(s) of each unique test and Category 3:Discussion of management or test interpretation with external physician/other qualified health care professional/appropriate source (  not separately reported) (I reviewed recent external hemodialysis/nephrology notes, cardiology notes, labs today: INR). ? ?Risks: Decision not to resuscitate or to de-escalate care because of poor prognosis ? ? ?William Field, NP ?Palliative Medicine Team ? ?Team Phone # [phone] (Nights/Weekends) ? 09/01/2020, 8:17 AM  ? ?

## 2021-05-11 NOTE — Progress Notes (Signed)
Admit: 05/04/2021 ?LOS: 7 ? ?82M ESRD THS [facility] with symptomatic anemia, sepsis, persistent hypotension req vasporessors, cirrhosis, FTT ? ?Subjective:  ?Weened off NE and on high dose midodrine ?Very somnolent right now, rec dilaudid earlier this AM, didn't sleep overnight ?BPs are soft at this time ?Tentative for trial of HD today ?Palliative, GI, and CV notes reviewed ? ?05/08 0701 - 05/09 0700 ?In: 631 [P.O.:730; I.V.:22; IV Piggyback:100] ?Out: -  ? ?Filed Weights  ? 05/09/21 0500 05/10/21 0500 05/11/21 0500  ?Weight: 112.8 kg 112.8 kg 112.7 kg  ? ? ?Scheduled Meds: ? allopurinol  200 mg Oral Daily  ? atorvastatin  20 mg Oral QHS  ? Chlorhexidine Gluconate Cloth  6 each Topical Daily  ? Chlorhexidine Gluconate Cloth  6 each Topical Q0600  ? docusate sodium  100 mg Oral BID  ? ferrous sulfate  325 mg Oral Q breakfast  ? hydrocortisone sod succinate (SOLU-CORTEF) inj  125 mg Intravenous Q12H  ? insulin aspart  0-6 Units Subcutaneous TID WC  ? lactulose  30 g Oral BID  ? levothyroxine  100 mcg Oral q morning  ? midodrine  20 mg Oral TID WC  ? nystatin cream   Topical BID  ? pantoprazole (PROTONIX) IV  40 mg Intravenous Q12H  ? rifaximin  550 mg Oral BID  ? sodium chloride flush  3 mL Intravenous Q12H  ? ?Continuous Infusions: ? sodium chloride    ? sodium chloride    ? sodium chloride    ? sodium chloride    ? norepinephrine (LEVOPHED) Adult infusion Stopped (05/10/21 0903)  ? ?PRN Meds:.sodium chloride, sodium chloride, sodium chloride, acetaminophen **OR** acetaminophen, alteplase, heparin, HYDROmorphone (DILAUDID) injection, ipratropium, levalbuterol, lidocaine (PF), lidocaine-prilocaine, ondansetron **OR** ondansetron (ZOFRAN) IV, oxyCODONE, pentafluoroprop-tetrafluoroeth, prochlorperazine, senna-docusate, sodium chloride flush, traMADol, traZODone ? ?Current Labs: reviewed  ? ? ?Physical Exam:  Blood pressure 120/71, pulse 97, temperature (!) 96.7 ?F (35.9 ?C), temperature source Oral, resp. rate 19,  height _0  (1.626 m), weight 112.7 kg, SpO2 (!) 86 %. ?Chronically ill appearing, NAD, lying in bed ?IRIR no rub ?CTAB on an auscultation ?Trace LEE at most, chronic hyperpigmentation in distal LEs ?Protuberant mild distension, mild TTP  ? ?Dialysis Orders: [facility] ?TTS 4hr EDW 108kg ?400/500 2/2.5 T35 ?Heparin 0 Mircera 220mg q2weeks (last 4/12) ?Left at 113.8kg on 4/20 ?AVF ? ?A ?ESRD THS  ?AoC hypotension, on midodrine 20 TID and NE was stopped 05/10/21 ?Recent GIB, ABLA, Hb stable 10.6 ?Sepsis ? PNA, ABX per TRH ?Cirrhosis, GI following ?Pulm HTN on sildinafil, RV failure ?DM2 ?AFib ? ?P ?He looks marginal at best for HD today ?I don't think he will do very well, and we will not attempt 'heroic' efforts at UF ?If he has sig IDH or other complications I think he is no longer a candidate and should transition to palliative/comfort measures ?Attempted dialysis 5/9: 2K, 3.5h, 400/600, UF 3L, AVF, no heparin; bolus albumin as needed ?Cont midodrine to 20 mg 3 times daily  ?Cont to discuss palliative option, as above ?Medication Issues; ?Preferred narcotic agents for pain control are hydromorphone, fentanyl, and methadone. Morphine should not be used.  ?Baclofen should be avoided ?Avoid oral sodium phosphate and magnesium citrate based laxatives / bowel preps  ? ? ?RPearson GrippeMD ?05/11/2021, 8:57 AM ? ?Recent Labs  ?Lab 05/07/21 ?1417 05/08/21 ?0429 05/09/21 ?0497005/08/23 ?0430  ?NA 133* 132* 134* 133*  ?K 4.3 4.1 3.4* 4.5  ?CL 100 99 99 100  ?CO2  _0 21*  ?GLUCOSE 130* 135* 128* 192*  ?BUN 35* 38* 20 28*  ?CREATININE 6.02* 6.42* 4.60* 5.89*  ?CALCIUM 7.9* 8.0* 8.2* 8.5*  ?PHOS 5.3*  --   --   --   ? ? ?Recent Labs  ?Lab 05/04/21 ?1228 05/04/21 ?1236 05/07/21 ?0436 05/07/21 ?1417 05/08/21 ?0429 05/09/21 ?8299 05/10/21 ?0430  ?WBC 5.8   < > 5.6   < > 6.3 5.7 3.9*  ?NEUTROABS 4.2  --  3.6  --   --   --   --   ?HGB 11.2*   < > 10.5*   < > 9.8* 10.1* 10.6*  ?HCT 38.4*   < > 35.2*   < > 34.0* 35.1* 37.1*  ?MCV  110.0*   < > 103.2*   < > 104.0* 102.9* 105.4*  ?PLT 127*   < > 76*   < > 58* 51* 62*  ? < > = values in this interval not displayed.  ? ? ? ? ? ? ? ? ? ? ?  ?

## 2021-05-12 ENCOUNTER — Ambulatory Visit: Payer: Medicare Other | Admitting: Cardiology

## 2021-05-12 DIAGNOSIS — Z515 Encounter for palliative care: Secondary | ICD-10-CM | POA: Diagnosis not present

## 2021-05-12 DIAGNOSIS — Z66 Do not resuscitate: Secondary | ICD-10-CM | POA: Diagnosis not present

## 2021-05-12 DIAGNOSIS — I959 Hypotension, unspecified: Secondary | ICD-10-CM | POA: Diagnosis not present

## 2021-05-12 DIAGNOSIS — Z7189 Other specified counseling: Secondary | ICD-10-CM | POA: Diagnosis not present

## 2021-05-12 DIAGNOSIS — R1010 Upper abdominal pain, unspecified: Secondary | ICD-10-CM

## 2021-05-12 DIAGNOSIS — E1121 Type 2 diabetes mellitus with diabetic nephropathy: Secondary | ICD-10-CM

## 2021-05-12 LAB — PROTIME-INR
INR: 1.5 — ABNORMAL HIGH (ref 0.8–1.2)
Prothrombin Time: 18.1 seconds — ABNORMAL HIGH (ref 11.4–15.2)

## 2021-05-12 LAB — GLUCOSE, CAPILLARY
Glucose-Capillary: 180 mg/dL — ABNORMAL HIGH (ref 70–99)
Glucose-Capillary: 243 mg/dL — ABNORMAL HIGH (ref 70–99)
Glucose-Capillary: 193 mg/dL — ABNORMAL HIGH (ref 70–99)
Glucose-Capillary: 222 mg/dL — ABNORMAL HIGH (ref 70–99)

## 2021-05-12 NOTE — Progress Notes (Signed)
Admit: 05/04/2021 ?LOS: 8 ? ?70M ESRD THS [facility] with symptomatic anemia, sepsis, persistent hypotension req vasporessors, cirrhosis, FTT ? ?Subjective:  ?Tolerated HD yesterday, 3L UF, req bolus albumin and UF profile 2 ?Off pressors ?More awake and alert this AM ?Tells me he wants to cont HD as able ? ?05/09 0701 - 05/10 0700 ?In: 240 [P.O.:240] ?Out: 3002 [Stool:2] ? ?Filed Weights  ? 05/11/21 0500 05/11/21 1346 05/12/21 0354  ?Weight: 112.7 kg 115.3 kg 111.2 kg  ? ? ?Scheduled Meds: ? allopurinol  200 mg Oral Daily  ? atorvastatin  20 mg Oral QHS  ? Chlorhexidine Gluconate Cloth  6 each Topical Daily  ? Chlorhexidine Gluconate Cloth  6 each Topical Q0600  ? docusate sodium  100 mg Oral BID  ? ferrous sulfate  325 mg Oral Q breakfast  ? hydrocortisone sod succinate (SOLU-CORTEF) inj  125 mg Intravenous Q12H  ? insulin aspart  0-6 Units Subcutaneous TID WC  ? lactulose  30 g Oral BID  ? levothyroxine  100 mcg Oral q morning  ? midodrine  20 mg Oral TID WC  ? nystatin cream   Topical BID  ? pantoprazole (PROTONIX) IV  40 mg Intravenous Q12H  ? rifaximin  550 mg Oral BID  ? sodium chloride flush  3 mL Intravenous Q12H  ? ?Continuous Infusions: ? sodium chloride    ? sodium chloride    ? sodium chloride    ? sodium chloride    ? ?PRN Meds:.sodium chloride, sodium chloride, sodium chloride, acetaminophen **OR** acetaminophen, alteplase, heparin, HYDROmorphone (DILAUDID) injection, ipratropium, levalbuterol, lidocaine (PF), lidocaine-prilocaine, ondansetron **OR** ondansetron (ZOFRAN) IV, oxyCODONE, pentafluoroprop-tetrafluoroeth, prochlorperazine, senna-docusate, sodium chloride flush, traMADol, traZODone ? ?Current Labs: reviewed  ? ? ?Physical Exam:  Blood pressure 101/66, pulse (!) 103, temperature 97.8 ?F (36.6 ?C), temperature source Oral, resp. rate 19, height _0  (1.626 m), weight 111.2 kg, SpO2 93 %. ?Chronically ill appearing, NAD, lying in bed ?IRIR no rub ?CTAB on an auscultation ?No Sig LEE at most,  chronic hyperpigmentation in distal LEs ?Protuberant mild distension, mild TTP  ? ?Dialysis Orders: [facility] ?TTS 4hr EDW 108kg ?400/500 2/2.5 T35 ?Heparin 0 Mircera 236mg q2weeks (last 4/12) ?Left at 113.8kg on 4/20 ?AVF ? ?A ?ESRD THS  ?AoC hypotension, on midodrine 20 TID and NE was stopped 05/10/21 ?Tolerated iHD with UF 05/11/21 off pressors ?Recent GIB, ABLA, Hb stable 10.6 ?Sepsis ? PNA, ABX per TRH ?Cirrhosis, GI following ?Pulm HTN on sildinafil, RV failure ?DM2 ?AFib ? ?P ?Cont HD on schedule as able, next Tx tomorrow:  2K, 3.5h, 400/600, UF 3L, AVF, no heparin; bolus albumin as needed; UF profile 2 ?If he has sig limiting IDH or other complications I think he is no longer a candidate and should transition to palliative/comfort measures ?Cont midodrine 20 mg 3 times daily  ?Cont to discuss palliative option, as above ?Medication Issues; ?Preferred narcotic agents for pain control are hydromorphone, fentanyl, and methadone. Morphine should not be used.  ?Baclofen should be avoided ?Avoid oral sodium phosphate and magnesium citrate based laxatives / bowel preps  ? ? ?RPearson GrippeMD ?05/12/2021, 9:01 AM ? ?Recent Labs  ?Lab 05/07/21 ?1417 05/08/21 ?0429 05/09/21 ?0262005/08/23 ?0430  ?NA 133* 132* 134* 133*  ?K 4.3 4.1 3.4* 4.5  ?CL 100 99 99 100  ?CO2 _1 21*  ?GLUCOSE 130* 135* 128* 192*  ?BUN 35* 38* 20 28*  ?CREATININE 6.02* 6.42* 4.60* 5.89*  ?CALCIUM 7.9* 8.0* 8.2* 8.5*  ?  PHOS 5.3*  --   --   --   ? ? ?Recent Labs  ?Lab 05/07/21 ?0436 05/07/21 ?1417 05/08/21 ?0429 05/09/21 ?5638 05/10/21 ?0430  ?WBC 5.6   < > 6.3 5.7 3.9*  ?NEUTROABS 3.6  --   --   --   --   ?HGB 10.5*   < > 9.8* 10.1* 10.6*  ?HCT 35.2*   < > 34.0* 35.1* 37.1*  ?MCV 103.2*   < > 104.0* 102.9* 105.4*  ?PLT 76*   < > 58* 51* 62*  ? < > = values in this interval not displayed.  ? ? ? ? ? ? ? ? ? ? ?  ?

## 2021-05-12 NOTE — Progress Notes (Signed)
?Daily Progress Note  ? ?Patient Name: William Flores       Date: 05/12/2021 ?DOB: 22-Apr-1950  Age: 71 y.o. MRN#: [id number] ?Attending Physician: Barton Dubois, MD ?Primary Care Physician: [name], [name], MD ?Admit Date: 05/04/2021 ?Length of Stay: 8 days ? ?Reason for Consultation/Follow-up: Establishing goals of care ? ?HPI/Patient Profile:  William Flores is a 71 year old male with multiple medical comorbidities including ESRD on HD TTS, right-sided heart failure, hypotension, hypothyroidism, Nash-liver cirrhosis, chronic anemia, cytopenia, hypokalemia, DM II, A-fib on Eliquis, obesity who presented from [facility] with complaint of nausea, vomiting, and generalized weakness that started yesterday.  He was admitted for septic shock due to multifocal pneumonia. Due to his symptoms and hypotension, patient did not undergo dialysis as scheduled on 5/2.  ? ?PMT consulted for [place] conversations. ? ?Hospitalization has been complicated by respiratory failure, requiring high flow oxygen. Episode of hematemesis and blood loss anemia requiring blood transfusion.  Altered mental status.  Hypotension requiring Levophed. Patient has indicated desire to continue HD, get better, back to life at [facility]. Aware of limitations with hypotension. Hospitalist states if remains on levophed or needs to be increased, would recommend hospice. ? ?Subjective:  ? ?Subjective: ?Chart Reviewed. Updates received. Patient Assessed. Created space and opportunity for patient  and family to explore thoughts and feelings regarding current medical situation. ? ?Today's Discussion: Today I met with the patient at the bedside.  He is noted to be much more awake today, answering questions appropriately, interacting significantly.  We discussed his hemodialysis yesterday and the fact that he tolerated it without need of accessory medications other than a dose of albumin.  He states he feels this went well.  Cannot tell whether swelling is better or  not.  We had further discussion on his desires for future treatment.  He states that he can continue hemodialysis he would like to continue this.  I informed him that the plan seems to be to repeat next hemodialysis on Thursday per his routine schedule.  I did reemphasized to him that he is still very sick, still has significant heart disease that is not amenable to treatment.  I expressed that at some point he will likely not be able to tolerate dialysis at which point we can shift to comfort focus of care.  He verbalized understanding. ? ?I asked if he would like me to update his daughter and he indicated not at this point. ? ?I provided emotional general support through therapeutic listening, empathy, and other techniques.  Answered all questions and addressed all concerns to the best my ability. ? ?Review of Systems  ?Constitutional:  Positive for fatigue.  ?Respiratory:  Negative for cough and shortness of breath.   ?Cardiovascular:  Positive for leg swelling.  ?Gastrointestinal:  Negative for abdominal pain.  ?Musculoskeletal:  Positive for back pain.  ? ?Objective:  ? ?Vital Signs:  ?BP (!) 104/58   Pulse 85   Temp 97.8 ?F (36.6 ?C) (Oral)   Resp 16   Ht 5' 4"  (1.626 m)   Wt 111.2 kg   SpO2 91%   BMI 42.08 kg/m?  ? ?Physical Exam: ?Physical Exam ?Vitals and nursing note reviewed.  ?Constitutional:   ?   General: He is not in acute distress. ?   Appearance: He is obese. He is ill-appearing.  ?HENT:  ?   Head: Normocephalic and atraumatic.  ?Cardiovascular:  ?   Rate and Rhythm: Normal rate.  ?Pulmonary:  ?   Effort: Pulmonary  effort is normal. No respiratory distress.  ?   Breath sounds: No wheezing.  ?Abdominal:  ?   General: Abdomen is protuberant.  ?   Palpations: Abdomen is soft.  ?   Tenderness: There is no abdominal tenderness.  ?Skin: ?   General: Skin is warm and dry.  ?Neurological:  ?   General: No focal deficit present.  ?   Mental Status: He is alert.  ?Psychiatric:     ?   Attention and  Perception: Attention normal.     ?   Mood and Affect: Affect is flat.     ?   Speech: Speech normal.     ?   Behavior: Behavior normal.     ?   Thought Content: Thought content normal.  ?   Comments: Seems contemplative, thinking about possibility of end of life  ? ?Unable to calculate MELD today ? ?Palliative Assessment/Data: 30% ? ? ?Assessment & Plan:  ? ?Impression: ?Present on Admission: ? Septic shock ([place]) ? History of colon cancer ? OSA (obstructive sleep apnea) ? Gout ? Atrial fibrillation ([place]) ? DM (diabetes mellitus), type 2 with renal complications ([place]) ? Chronic respiratory failure with hypoxia (HCC) ? Mixed hyperlipidemia ? Iron deficiency anemia ? (Resolved) Chronic renal failure ? Liver cirrhosis secondary to NASH [facility]) ? GERD (gastroesophageal reflux disease) ? Hypotension ? Morbid obesity ([place]) ? Elevated troponin ? ?71 year old male in a very precarious situation with severe pulmonary artery hypertension, right heart failure, end-stage renal disease on hemodialysis.  He was admitted with pneumonia and sepsis.  His sepsis has been treated and at this point it appears the genesis of his persistent blood pressure issues and need for pressors is around his right heart failure which cardiology does not feel there is much treatment to offer.  At this point he has not been tolerating significant hemodialysis (had to miss a session a few days ago).  We discussed that long-term pressors were not an option.  Current plan is to reattempt hemodialysis tomorrow (per nephrology) and see how he does.  If no improvement from his current situation then we will need to discuss shifting toward more comfort focused care and discontinuation of hemodialysis.  He seems to understand.  Overall prognosis quite poor.  ? ?SUMMARY OF RECOMMENDATIONS   ?Remain DNR ?Continue to treat the treatable for now; wishes to continue iHD if he is able to tolerate ?iHD scheduled for Thursday if tolerated: maxed midodrine, ok albumin;  no pressors ?Stop iHD if not tolerated (per nephrology) ?PMT will follow up and continue [place] discussions based on how he tolerates iHD; if doesn't tolerate, likely no further HD, transition to comfort/hospice ? ?Symptom Management:  ?Per primary team ?PMT is available to assist as needed ? ?Code Status: DNR ? ?Prognosis: < 2 weeks ? ?Discharge Planning: To Be Determined ? ?Discussed with: Medical team, nursing team, patient ? ?Thank you for allowing [place] to participate in the care of ISAK SOTOMAYOR ?PMT will continue to support holistically. ? ?Billing based on MDM: High ? ?Problems Addressed: One acute or chronic illness or injury that poses a threat to life or bodily function ? ?Amount and/or Complexity of Data: Category 1:Review of prior external note(s) from each unique source and Review of the result(s) of each unique test and Category 3:Discussion of management or test interpretation with external physician/other qualified health care professional/appropriate source (not separately reported) (I reviewed recent external hemodialysis/nephrology notes, cardiology notes, labs today: INR). ? ?Risks: Decision not  to resuscitate or to de-escalate care because of poor prognosis ? ? ?Walden Field, NP ?Palliative Medicine Team ? ?Team Phone # [phone] (Nights/Weekends) ? 09/01/2020, 8:17 AM  ? ?

## 2021-05-12 NOTE — Progress Notes (Signed)
Occupational Therapy Treatment ?Patient Details ?Name: William Flores ?MRN: [id number] ?DOB: Jan 13, 1950 ?Today's Date: 05/12/2021 ? ? ?History of present illness William Flores is a 71 year old male with multiple colors including ESRD on HD TTS, right-sided heart failure, hypotension, hypothyroidism, Nash-liver cirrhosis, chronic anemia, cytopenia, hypokalemia, DM II, A-fib  on Eliquis, obesity ...      Presenting today from [facility] with complaint of nausea vomiting and generalized weakness that started yesterday patient.  He refuses hemodialysis today.  Patient was noted for hypotension, and bradycardia therefore was sent to the ED for further evaluation ?  ?OT comments ? Pt agreeable to OT treatment. Pt reported pain in back and R leg at start of session. Pt required moderate assist to push to sitting at EOB with head of bed elevated. Pt demonstrates fair to good sitting balance. Moderate assist needed for x3 reps of standing at EOB with 2 trials of lateral steps to L side while using RW. Pt very fatigued needing rest breaks between trials. Pt left in bed with call bell within reach. Pt will benefit from continued OT in the hospital and recommended venue below to increase strength, balance, and endurance for safe ADL's.  ? ?  ? ?Recommendations for follow up therapy are one component of a multi-disciplinary discharge planning process, led by the attending physician.  Recommendations may be updated based on patient status, additional functional criteria and insurance authorization. ?   ?Follow Up Recommendations ? Skilled nursing-short term rehab (<3 hours/day)  ?  ?Assistance Recommended at Discharge Frequent or constant Supervision/Assistance  ?Patient can return home with the following ? A lot of help with walking and/or transfers;A lot of help with bathing/dressing/bathroom;Assistance with feeding;Assistance with cooking/housework;Assist for transportation;Help with stairs or ramp for entrance ?   ?Equipment Recommendations ? None recommended by OT  ?  ?Recommendations for Other Services   ? ?  ?Precautions / Restrictions Precautions ?Precautions: Fall ?Restrictions ?Weight Bearing Restrictions: No  ? ? ?  ? ?Mobility Bed Mobility ?Overal bed mobility: Needs Assistance ?Bed Mobility: Rolling, Sidelying to Sit ?Rolling: Min assist, Mod assist ?Sidelying to sit: Mod assist ; HOB elevated ? Sit to supine: mod assist  ?  ?  ?General bed mobility comments: Slow labored movement; assisted to push to sit from sidelying. ?  ? ?Transfers ?Overall transfer level: Needs assistance ?Equipment used: Rolling walker (2 wheels) ?Transfers: Sit to/from Stand ?Sit to Stand: Mod assist ?  ?  ?  ?  ?  ?General transfer comment: Moderate assist with RW; pt able to take lateral steps towards head of bed with moderate assist as well. x3 reps of sit to stand completed. ?  ?  ?Balance Overall balance assessment: Needs assistance ?Sitting-balance support: Feet supported, No upper extremity supported ?Sitting balance-Leahy Scale: Fair ?Sitting balance - Comments: fair/good seated at EOB ?  ?Standing balance support: During functional activity, Bilateral upper extremity supported ?Standing balance-Leahy Scale: Poor ?Standing balance comment: fair/poor using RW ?  ?  ?  ?  ?  ?  ?  ?  ?  ?  ?  ?   ? ? ? ? ?Cognition Arousal/Alertness: Awake/alert ?Behavior During Therapy: William Flores for tasks assessed/performed ?Overall Cognitive Status: Within Functional Limits for tasks assessed ?  ?  ?  ?  ?  ?  ?  ?  ?  ?  ?  ?  ?  ?  ?  ?  ?  ?  ?  ?   ?   ? ?  ?   ? ? ?  ?    ? ? ?  Pertinent Vitals/ Pain       Pain Assessment ?Pain Assessment: Faces ?Faces Pain Scale: Hurts little more ?Pain Location: low back, R hip and leg ?Pain Descriptors / Indicators: Grimacing ?Pain Intervention(s): Limited activity within patient's tolerance, Monitored during session, Repositioned ? ?   ?  ?  ?  ?  ?  ?  ?  ?  ?  ?  ?  ?  ?  ?  ?  ?  ?  ?  ? ?  ?    ?  ?  ?  ?    ? ?Frequency ? Min 2X/week  ? ? ? ? ?  ?Progress Toward Goals ? ?OT Goals(current goals can now be found in the care plan section) ? Progress towards OT goals: Progressing toward goals ? ?Acute Rehab OT Goals ?Patient Stated Goal: Go to [facility] ?OT Goal Formulation: With patient ?Time For Goal Achievement: 05/21/21 ?Potential to Achieve Goals: Fair ?ADL Goals ?Pt Will Perform Grooming: with min assist;bed level ?Pt Will Perform Upper Body Bathing: with min assist;bed level ?Pt Will Perform Lower Body Bathing: with mod assist;bed level ?Pt Will Perform Upper Body Dressing: with min assist;bed level ?Pt Will Perform Lower Body Dressing: with mod assist;sitting/lateral leans ?Pt Will Transfer to Toilet: with mod assist;stand pivot transfer ?Pt Will Perform Toileting - Clothing Manipulation and hygiene: with mod assist ?Pt/caregiver will Perform Home Exercise Program: Increased ROM;Increased strength;Both right and left upper extremity;With minimal assist  ?Plan Discharge plan remains appropriate   ? ?   ?  ?  ?  ?  ? ?  ?   ?  ?  ?  ?  ?  ?  ? ?  ?End of Session Equipment Utilized During Treatment: Oxygen;Rolling walker (2 wheels) ? ?OT Visit Diagnosis: Unsteadiness on feet (R26.81);Other abnormalities of gait and mobility (R26.89);Muscle weakness (generalized) (M62.81) ?  ?Activity Tolerance Patient tolerated treatment well ?  ?Patient Left in bed;with call bell/phone within reach ?  ?Nurse Communication Mobility status;Other (comment) (Notified pt reported feeling like he used the bathroom.) ?  ? ?   ? ?Time: 7972-8206 ?OT Time Calculation (min): 15 min ? ?Charges: OT General Charges ?$OT Visit: 1 Visit ?OT Treatments ?$Therapeutic Exercise: 8-22 mins ? ?William Flores OT, MOT ? ?William Flores ?05/12/2021, 8:55 AM ?

## 2021-05-12 NOTE — Progress Notes (Signed)
?PROGRESS NOTE ? ? ? ?William Flores  NLG:[id number] DOB: [birth year] DOA: 05/04/2021 ?PCP: [name], [name], MD  ? ?  ?Brief Narrative:  ?William Flores is a 71 year old male with multiple medical comorbidities including ESRD on HD TTS, right-sided heart failure, hypotension, hypothyroidism, Nash-liver cirrhosis, chronic anemia, cytopenia, hypokalemia, DM II, A-fib on Eliquis, obesity who presents from [facility] with complaint of nausea, vomiting, and generalized weakness that started yesterday.  Due to his symptoms, patient did not undergo dialysis as scheduled on 5/2.  In the emergency department, patient met sepsis criteria, concerning for multifocal pneumonia.  Patient was started on IV antibiotics and admitted to the hospital.  Nephrology, cardiology, pulmonology, palliative care consulted. ? ?Hospitalization has been complicated by respiratory failure, requiring high flow oxygen.  Episode of hematemesis and blood loss anemia requiring blood transfusion.  Altered mental status.  Hypotension requiring Levophed. ? ?New events last 24 hours / Subjective: ?Patient is fatigued but alert and conversational.  Underwent dialysis last night without issue.  Remains on Levophed today.  Discussed with him his critical illness, poor prognosis, hospice.  When asked about how he would feel about stopping dialysis and transitioning to hospice he tells me "I don't know" ? ?I had a conversation over the phone with step-daughter, William Flores, who I have been updating daily.  Patient remains with very poor prognosis overall.  Remains on Levophed, has been on Levophed for 4 days, unable to wean.  At this point, it is unlikely that patient would be able to tolerate outpatient dialysis.  Recommended to her to discuss end-of-life, comfort care, hospice with patient today.  Palliative care, nephrology, cardiology services will be back on Monday.  Can see if they have any further recommendations, but I suspect that recommendation would  be to transition to hospice.  His prognosis is grave, likely days.  ? ?05/11/21: Somnolent/sleepy but able to follow simple commands.  Currently off pressors and with at times later today for hemodialysis.  Follow clinical response.  Overall condition continues to be poor with very guarded outcome at this point.  Continue to follow assistance by nephrology service and palliative care. ? ?5/10: Well-tolerated hemodialysis almost 3 L removed.  Currently reporting no chest pain and feeling better.  Still requiring 2 L nasal cannula supplementation and having signs of fluid overload on exam.  Next dialysis treatment anticipated for 05/13/2021.  We will continue following 1 day at a time.  Appreciate assistance and recommendation by nephrology service and palliative care service. ? ?Assessment & Plan: ?Principal Problem: ?  Septic shock ([place]) ?Active Problems: ?  ESRD (end stage renal disease) on dialysis [facility]) ?  Hypotension ?  Elevated troponin ?  Chronic right heart failure (HCC) ?  OSA (obstructive sleep apnea) ?  Atrial fibrillation ([place]) ?  Chronic respiratory failure with hypoxia (HCC) ?  Gout ?  History of colon cancer ?  DM (diabetes mellitus), type 2 with renal complications ([place]) ?  Mixed hyperlipidemia ?  Iron deficiency anemia ?  Liver cirrhosis secondary to NASH [facility]) ?  Morbid obesity ([place]) ?  Acute respiratory failure with hypoxia ([place]) ?  Nonischemic cardiomyopathy ([place]) ?  Pulmonary hypertension, unspecified ([place]) ?  GERD (gastroesophageal reflux disease) ?  Nausea and vomiting ?  Pain of upper abdomen ?  Constipation ?  Ischemic hepatitis ?  Hematemesis ? ? ?Septic shock secondary to multifocal pneumonia ?-He does have chronic hypotension on midodrine (specially around hemodialysis treatment). ?-MRSA PCR negative ?-Patient has completed  IV antibiotics while inpatient. ?-Continue midodrine on the use of Solu-Cortef. ?-Currently off Levophed; blood pressure marginal with a systolic blood pressure in the  90s.  Will assess patient tolerance of hemodialysis without pressors support. ?-Condition is critical, overall poor prognosis and guarded outcome. ? ?Acute on chronic hypoxemic respiratory failure ?-Chronically using 2 L nasal cannula O2 at baseline ?-Insetting of multifocal pneumonia, missed dialysis sessions and vascular congestion/interstitial edema due to right-sided heart failure. ?-CXR showing worsening findings of CHF or fluid overload, increased moderate pleural effusions, persistent left basilar consolidation with bilateral increased opacities in the right-greater-than-left consistent with edema, ?pneumonia or combination ?-Continue volume management per hemodialysis as tolerated ?-Patient has completed a total of 7 days of antibiotics and is currently afebrile. ?-Continue supportive care and wean off oxygen supplementation as tolerated. ?-Patient is afebrile and is stable. ? ?Hematemesis, acute blood loss anemia ?-Transfused 2 unit packed red blood cells 5/3  ?-Continue PPI ?-Continue to hold Eliquis/IV heparin ?-Appreciate assistance/recommendations by GI service.    ?-No plans for endoscopy currently.   ? ?ESRD on HD TTS ?-Nephrology following ?-HD 5/4, 5/6; first dialysis without the need of pressors on 05/11/2021; almost 3 L removed and well-tolerated. ?-Will continue to closely follow his tolerance and recommendations by nephrology service. ?-Next anticipated treatment 05/13/2021. ?-Okay to continue midodrine and Solu-Cortef. ? ?Acute metabolic encephalopathy ?-Waxing and waning ?-Oriented x3 and following commands appropriately: ?-Continue constant reorientation and supportive care ?-Follow response. ? ?Chronic A-fib ?-Eliquis/IV heparin on hold due to hematemesis ?-Continue holding metoprolol in the setting of hypotension. ?-Rate controlled and stable currently. ? ?Chronic diastolic heart failure/RV failure  ?NICM ?-Continue volume management with hemodialysis; tolerated almost 3 L removal with  dialysis on 05/11/2021; next treatment 05/13/2021. ?-Patient has not required pressor supports. ?-Continue to closely follow response. ?-Cardiology has signed off at this point at this no other treatment that they can offer him. ?-Continue to follow daily weights/strict I's and O's. ? ?Pulmonary hypertension ?-Continue the use of sildenafil. ? ?NASH cirrhosis with elevated LFTs ?-Continue rifaximin and lactulose. ?-Mentation is normal and is stable.. ?-Will follow recommendations by GI; overall very poor prognosis ?-Concerned that his elevated LFTs are secondary to hepatic congestion. ?-Continue to follow LFTs trend. ?-Volume management per hemodialysis. ? ?Nausea, vomiting, abdominal pain ?-CT abdomen pelvis revealed possible acute pancreatitis, lipase 19 ?-Patient reports no nausea or vomiting currently; apparently had 1 episode of vomiting overnight. ?-Continue supportive care and follow clinical response. ?-Continue as needed antiemetics. ? ?Diabetes mellitus type 2 with renal complications ?-K3C 4.3 ?-Continue SSI ?-Continue to follow CBGs with further adjustment to hypoglycemic regimen as needed. ? ?Demand ischemia ?-In setting of hypotension, ESRD ?-Echocardiogram showed EF 60 to 65%, no regional wall motion abnormality, RV pressure overload with reduced RV systolic function; going along with severe pulmonary hypertension and right heart failure. ?-Cardiology service recommending transition to hospice and comfort care if unable to tolerate dialysis without pressors. ?-Patient has remained off pressors support and tolerated almost 3 L removal with dialysis on 05/11/2021. ?-Reports no chest pain. ? ?Hypothyroidism ?-Continue Synthroid. ? ?HLD ?-Continue the use of Lipitor ? ? ?DVT prophylaxis:  ?TED hose Start: 05/04/21 1424 ?SCDs Start: 05/04/21 1424 ? ?Code Status: DNR ?Family Communication: No family at bedside. ? ?Disposition Plan:  ?Status is: Remains inpatient ? ?Remains inpatient appropriate because: Patient  has remained critically ill; with overall poor prognosis and guarded condition. ? ?Consultants:  ?GI ?Cardiology ?Nephrology ?Palliative care ?PCCM  ? ? ?Antimicrobials:  ?Anti-infectives (From admission,  onwar

## 2021-05-13 ENCOUNTER — Ambulatory Visit (INDEPENDENT_AMBULATORY_CARE_PROVIDER_SITE_OTHER): Payer: Medicare Other | Admitting: Gastroenterology

## 2021-05-13 DIAGNOSIS — I959 Hypotension, unspecified: Secondary | ICD-10-CM | POA: Diagnosis not present

## 2021-05-13 DIAGNOSIS — Z7189 Other specified counseling: Secondary | ICD-10-CM | POA: Diagnosis not present

## 2021-05-13 DIAGNOSIS — Z66 Do not resuscitate: Secondary | ICD-10-CM | POA: Diagnosis not present

## 2021-05-13 DIAGNOSIS — Z515 Encounter for palliative care: Secondary | ICD-10-CM | POA: Diagnosis not present

## 2021-05-13 LAB — RENAL FUNCTION PANEL
Albumin: 3.2 g/dL — ABNORMAL LOW (ref 3.5–5.0)
Anion gap: 10 (ref 5–15)
BUN: 38 mg/dL — ABNORMAL HIGH (ref 8–23)
CO2: 24 mmol/L (ref 22–32)
Calcium: 8.5 mg/dL — ABNORMAL LOW (ref 8.9–10.3)
Chloride: 97 mmol/L — ABNORMAL LOW (ref 98–111)
Creatinine, Ser: 6.35 mg/dL — ABNORMAL HIGH (ref 0.61–1.24)
GFR, Estimated: 9 mL/min — ABNORMAL LOW (ref >60)
Glucose, Bld: 213 mg/dL — ABNORMAL HIGH (ref 70–99)
Phosphorus: 5.4 mg/dL — ABNORMAL HIGH (ref 2.5–4.6)
Potassium: 3.9 mmol/L (ref 3.5–5.1)
Sodium: 131 mmol/L — ABNORMAL LOW (ref 135–145)

## 2021-05-13 LAB — CBC
HCT: 36.5 % — ABNORMAL LOW (ref 39.0–52.0)
Hemoglobin: 11 g/dL — ABNORMAL LOW (ref 13.0–17.0)
MCH: 31.3 pg (ref 26.0–34.0)
MCHC: 30.1 g/dL (ref 30.0–36.0)
MCV: 103.7 fL — ABNORMAL HIGH (ref 80.0–100.0)
Platelets: 71 10*3/uL — ABNORMAL LOW (ref 150–400)
RBC: 3.52 MIL/uL — ABNORMAL LOW (ref 4.22–5.81)
RDW: 25.7 % — ABNORMAL HIGH (ref 11.5–15.5)
WBC: 6.6 10*3/uL (ref 4.0–10.5)
nRBC: 1.2 % — ABNORMAL HIGH (ref 0.0–0.2)

## 2021-05-13 LAB — GLUCOSE, CAPILLARY
Glucose-Capillary: 196 mg/dL — ABNORMAL HIGH (ref 70–99)
Glucose-Capillary: 260 mg/dL — ABNORMAL HIGH (ref 70–99)
Glucose-Capillary: 204 mg/dL — ABNORMAL HIGH (ref 70–99)

## 2021-05-13 LAB — PROTIME-INR
INR: 1.5 — ABNORMAL HIGH (ref 0.8–1.2)
Prothrombin Time: 17.9 seconds — ABNORMAL HIGH (ref 11.4–15.2)

## 2021-05-13 MED ORDER — ALBUMIN HUMAN 25 % IV SOLN
INTRAVENOUS | Status: AC
  Administered 2021-05-13: 50 g
  Filled 2021-05-13: qty 100

## 2021-05-13 MED ORDER — ONDANSETRON HCL 4 MG/2ML IJ SOLN
INTRAMUSCULAR | Status: AC
  Administered 2021-05-13: 4 mg
  Filled 2021-05-13: qty 2

## 2021-05-13 NOTE — Progress Notes (Signed)
?PROGRESS NOTE ? ? ? ?William Flores  TSV:[id number] DOB: [birth year] DOA: 05/04/2021 ?PCP: [name], [name], MD  ? ?  ?Brief Narrative:  ?William Flores is a 71 year old male with multiple medical comorbidities including ESRD on HD TTS, right-sided heart failure, hypotension, hypothyroidism, [place]-liver cirrhosis, chronic anemia, cytopenia, hypokalemia, DM II, A-fib on Eliquis, obesity who presents from [facility] with complaint of nausea, vomiting, and generalized weakness that started yesterday.  Due to his symptoms, patient did not undergo dialysis as scheduled on 5/2.  In the emergency department, patient met sepsis criteria, concerning for multifocal pneumonia.  Patient was started on IV antibiotics and admitted to the hospital.  Nephrology, cardiology, pulmonology, palliative care consulted. ? ?Hospitalization has been complicated by respiratory failure, requiring high flow oxygen.  Episode of hematemesis and blood loss anemia requiring blood transfusion.  Altered mental status.  Hypotension requiring Levophed. ? ?New events last 24 hours / Subjective: ?Patient is fatigued but alert and conversational.  Underwent dialysis last night without issue.  Remains on Levophed today.  Discussed with him his critical illness, poor prognosis, hospice.  When asked about how he would feel about stopping dialysis and transitioning to hospice he tells me "I don't know" ? ?I had a conversation over the phone with step-daughter, [name], who I have been updating daily.  Patient remains with very poor prognosis overall.  Remains on Levophed, has been on Levophed for 4 days, unable to wean.  At this point, it is unlikely that patient would be able to tolerate outpatient dialysis.  Recommended to her to discuss end-of-life, comfort care, hospice with patient today.  Palliative care, nephrology, cardiology services will be back on Monday.  Can see if they have any further recommendations, but I suspect that recommendation would  be to transition to hospice.  His prognosis is grave, likely days.  ? ?05/11/21: Somnolent/sleepy but able to follow simple commands.  Currently off pressors and with at times later today for hemodialysis.  Follow clinical response.  Overall condition continues to be poor with very guarded outcome at this point.  Continue to follow assistance by nephrology service and palliative care. ? ?5/10: Well-tolerated hemodialysis almost 3 L removed.  Currently reporting no chest pain and feeling better.  Still requiring 2 L nasal cannula supplementation and having signs of fluid overload on exam.  Next dialysis treatment anticipated for 05/13/2021.  We will continue following 1 day at a time.  Appreciate assistance and recommendation by nephrology service and palliative care service. ? ?5/11: Afebrile, no chest pain, no nausea, no vomiting.  Another successful dialysis without the need of pressors.  Ultrafiltration 3300 L.  Only 25 g of albumin were required.  If patient remains stable and tolerating dialysis hopefully will be able to return back to skilled nursing facility on 05/16/2021. ? ?Assessment & Plan: ?Principal Problem: ?  Septic shock ([place]) ?Active Problems: ?  ESRD (end stage renal disease) on dialysis [facility]) ?  Hypotension ?  Elevated troponin ?  Chronic right heart failure (HCC) ?  OSA (obstructive sleep apnea) ?  Atrial fibrillation ([place]) ?  Chronic respiratory failure with hypoxia (HCC) ?  Gout ?  History of colon cancer ?  DM (diabetes mellitus), type 2 with renal complications ([place]) ?  Mixed hyperlipidemia ?  Iron deficiency anemia ?  Liver cirrhosis secondary to [facility]) ?  Morbid obesity ([place]) ?  Acute respiratory failure with hypoxia ([place]) ?  Nonischemic cardiomyopathy ([place]) ?  Pulmonary hypertension, unspecified ([place]) ?  GERD (gastroesophageal reflux disease) ?  Nausea and vomiting ?  Pain of upper abdomen ?  Constipation ?  Ischemic hepatitis ?  Hematemesis ? ? ?Septic shock secondary to multifocal  pneumonia ?-He does have chronic hypotension on midodrine (specially around hemodialysis treatment). ?-MRSA PCR negative ?-Patient has completed IV antibiotics while inpatient. ?-Continue midodrine on the use of Solu-Cortef. ?-Currently off Levophed; blood pressure marginal with a systolic blood pressure in the 90s.  Will assess patient tolerance of hemodialysis without pressors support. ?-Condition is critical, overall poor prognosis and guarded outcome. ? ?Acute on chronic hypoxemic respiratory failure ?-Chronically using 2 L nasal cannula O2 at baseline ?-Insetting of multifocal pneumonia, missed dialysis sessions and vascular congestion/interstitial edema due to right-sided heart failure. ?-CXR showing worsening findings of CHF or fluid overload, increased moderate pleural effusions, persistent left basilar consolidation with bilateral increased opacities in the right-greater-than-left consistent with edema, ?pneumonia or combination ?-Continue volume management per hemodialysis as tolerated. ?-Patient has completed a total of 7 days of antibiotics and is currently afebrile. ?-Continue supportive care and wean off oxygen supplementation as tolerated. ?-Patient is afebrile and has remained stable overall. ? ?Hematemesis, acute blood loss anemia ?-Transfused 2 unit packed red blood cells 5/3  ?-Continue PPI ?-Continue to hold Eliquis/IV heparin ?-Appreciate assistance/recommendations by GI service.    ?-No plans for endoscopy currently.   ? ?ESRD on HD TTS ?-Nephrology following ?-HD 5/4, 5/6; 05/11/21 (first hemodialysis without the need of pressors). ?-So far well-tolerated treatment on today's hemodialysis requiring only 25 g of albumin and tolerating net ultrafiltration of 3300 L. ?-Will continue to closely follow his tolerance and recommendations by nephrology service. ?-Next anticipated treatment 05/15/2021. ?-Okay to continue midodrine and Solu-Cortef. ? ?Acute metabolic encephalopathy ?-Waxing and  waning ?-Oriented x3 and following commands appropriately: ?-Continue constant reorientation and supportive care ?-Follow response. ? ?Chronic A-fib ?-Eliquis/IV heparin on hold due to hematemesis ?-Continue holding metoprolol in the setting of hypotension. ?-Rate controlled and stable currently. ? ?Chronic diastolic heart failure/RV failure  ?NICM ?-Continue volume management with hemodialysis;  ?-Patient has tolerated hemodialysis without the use of pressor and requiring only 25 g of albumin.  Net ultrafiltration 3300L. ?-Next hemodialysis treatment 05/15/2021. ?-Patient has not required pressor supports. ?-Continue to closely follow response. ?-Cardiology has signed off at this point at this no other treatment that they can offer him. ?-Continue to follow daily weights/strict I's and O's. ? ?Pulmonary hypertension ?-Continue the use of sildenafil. ? ?NASH cirrhosis with elevated LFTs ?-Continue rifaximin and lactulose. ?-Mentation is normal and is stable.. ?-Will follow recommendations by GI; overall very poor prognosis ?-Concerned that his elevated LFTs are secondary to hepatic congestion. ?-Continue to follow LFTs trend. ?-Continue volume management per hemodialysis. ? ?Nausea, vomiting, abdominal pain ?-CT abdomen pelvis revealed possible acute pancreatitis, lipase 19 ?-No nausea, no vomiting, no abdominal pain currently.  No overnight events reported. ?-So far tolerating diet. ?-Continue as needed antiemetics. ? ?Diabetes mellitus type 2 with renal complications ?-W0J 4.3 ?-Continue SSI ?-Continue to follow CBGs with further adjustment to hypoglycemic regimen as needed. ? ?Demand ischemia ?-In setting of hypotension, ESRD ?-Echocardiogram showed EF 60 to 65%, no regional wall motion abnormality, RV pressure overload with reduced RV systolic function; going along with severe pulmonary hypertension and right heart failure. ?-Cardiology service recommending transition to hospice and comfort care if unable to  tolerate dialysis without pressors. ?-Patient has remained without chest pain. ?-So far continue tolerating hemodialysis without the use of pressors. ? ?Hypothyroidism ?-Continue Synthroid. ? ?HLD ?-  Continue the use of Nicoletta Dress

## 2021-05-13 NOTE — Progress Notes (Signed)
?Daily Progress Note  ? ?Patient Name: William Flores       Date: 05/13/2021 ?DOB: 07/29/1950  Age: 71 y.o. MRN#: [id number] ?Attending Physician: Barton Dubois, MD ?Primary Care Physician: [name], [name], MD ?Admit Date: 05/04/2021 ?Length of Stay: 9 days ? ?Reason for Consultation/Follow-up: Establishing goals of care ? ?HPI/Patient Profile:  William Flores is a 71 year old male with multiple medical comorbidities including ESRD on HD TTS, right-sided heart failure, hypotension, hypothyroidism, [place]-liver cirrhosis, chronic anemia, cytopenia, hypokalemia, DM II, A-fib on Eliquis, obesity who presented from [facility] with complaint of nausea, vomiting, and generalized weakness that started yesterday.  He was admitted for septic shock due to multifocal pneumonia. Due to his symptoms and hypotension, patient did not undergo dialysis as scheduled on 5/2.  ? ?PMT consulted for [place] conversations. ? ?Hospitalization has been complicated by respiratory failure, requiring high flow oxygen. Episode of hematemesis and blood loss anemia requiring blood transfusion.  Altered mental status.  Hypotension requiring Levophed. Patient has indicated desire to continue HD, get better, back to life at [place]. Aware of limitations with hypotension. Hospitalist states if remains on levophed or needs to be increased, would recommend hospice. ? ?Subjective:  ? ?Subjective: ?Chart Reviewed. Updates received. Patient Assessed. Created space and opportunity for patient  and family to explore thoughts and feelings regarding current medical situation. ? ?Today's Discussion: Today I met with the patient at the bedside while he was in HD. He was much more awake during my visit. Denies abdominal pain, N/V. States he feels like he's doing better. Would like to continue iHD as long as he is a candidate. Discussed that he remains very ill and we would continue to take it a day at a time. If at some point he is not tolerating iHD he likely  would not be a candidate for continued HD. He verbalized understanding. Stated that he would like to continue updating his family and for [place] to not call. ? ?I provided emotional general support through therapeutic listening, empathy, and other techniques.  Answered all questions and addressed all concerns to the best my ability. ? ?Review of Systems  ?Constitutional:  Positive for fatigue.  ?Respiratory:  Negative for cough and shortness of breath.   ?Cardiovascular:  Positive for leg swelling.  ?Gastrointestinal:  Negative for abdominal pain.  ?Musculoskeletal:  Positive for back pain.  ? ?Objective:  ? ?Vital Signs:  ?BP 125/71   Pulse 79   Temp 97.8 ?F (36.6 ?C) (Temporal)   Resp 18   Ht 5' 4" (1.626 m)   Wt 116.4 kg   SpO2 94%   BMI 44.05 kg/m?  ? ?Physical Exam: ?Physical Exam ?Vitals and nursing note reviewed.  ?Constitutional:   ?   General: He is not in acute distress. ?   Appearance: He is obese. He is ill-appearing.  ?HENT:  ?   Head: Normocephalic and atraumatic.  ?Cardiovascular:  ?   Rate and Rhythm: Normal rate.  ?Pulmonary:  ?   Effort: Pulmonary effort is normal. No respiratory distress.  ?   Breath sounds: No wheezing.  ?Abdominal:  ?   General: Abdomen is protuberant.  ?   Palpations: Abdomen is soft.  ?   Tenderness: There is no abdominal tenderness.  ?Skin: ?   General: Skin is warm and dry.  ?Neurological:  ?   General: No focal deficit present.  ?   Mental Status: He is alert.  ?Psychiatric:     ?   Attention  and Perception: Attention normal.     ?   Mood and Affect: Affect is flat.     ?   Speech: Speech normal.     ?   Behavior: Behavior normal.     ?   Thought Content: Thought content normal.  ?   Comments: Seems contemplative, thinking about possibility of end of life  ? ?Unable to calculate MELD today ? ?Palliative Assessment/Data: 30% ? ? ?Assessment & Plan:  ? ?Impression: ?Present on Admission: ? Septic shock (William Flores) ? History of colon cancer ? OSA (obstructive sleep apnea) ?  Gout ? Atrial fibrillation (William Flores) ? DM (diabetes mellitus), type 2 with renal complications (William Flores) ? Chronic respiratory failure with hypoxia (William Flores) ? Mixed hyperlipidemia ? Iron deficiency anemia ? (Resolved) Chronic renal failure ? Liver cirrhosis secondary to [facility]) ? GERD (gastroesophageal reflux disease) ? Hypotension ? Morbid obesity (William Flores) ? Elevated troponin ? ?71 year old male in a very precarious situation with severe pulmonary artery hypertension, right heart failure, end-stage renal disease on hemodialysis.  He was admitted with pneumonia and sepsis.  His sepsis has been treated and at this point it appears the genesis of his persistent blood pressure issues and need for pressors is around his right heart failure which cardiology does not feel there is much treatment to offer.  At this point he has not been tolerating significant hemodialysis (had to miss a session a few days ago).  We discussed that long-term pressors were not an option.  Current plan is to reattempt hemodialysis tomorrow (per nephrology) and see how he does.  If no improvement from his current situation then we will need to discuss shifting toward more comfort focused care and discontinuation of hemodialysis.  He seems to understand.  Overall prognosis quite poor.  ? ?SUMMARY OF RECOMMENDATIONS   ?Remain DNR ?Continue to treat the treatable for now; wishes to continue iHD if he is able to tolerate ?iHD as scheduled if tolerated: maxed midodrine, ok albumin; no pressors ?Stop iHD if not tolerated (per nephrology) ?PMT will follow up and continue William Flores discussions based on how he tolerates iHD; if doesn't tolerate, likely no further HD, transition to comfort/hospice ? ?Symptom Management:  ?Per primary team ?PMT is available to assist as needed ? ?Code Status: DNR ? ?Prognosis: < 2 weeks ? ?Discharge Planning: To Be Determined ? ?Discussed with: Medical team, nursing team, patient ? ?Thank you for allowing [place] to participate in the care of  William Flores ?PMT will continue to support holistically. ? ?Billing based on MDM: High ? ?Problems Addressed: One acute or chronic illness or injury that poses a threat to life or bodily function ? ?Amount and/or Complexity of Data: Category 1:Review of prior external note(s) from each unique source and Review of the result(s) of each unique test and Category 3:Discussion of management or test interpretation with external physician/other qualified health care professional/appropriate source (not separately reported) (I reviewed recent external hemodialysis/nephrology notes, cardiology notes, labs today: CBC, RFP, INR). ? ?Risks: Decision not to resuscitate or to de-escalate care because of poor prognosis ? ? ?Walden Field, NP ?Palliative Medicine Team ? ?Team Phone # [phone] (Nights/Weekends) ? 09/01/2020, 8:17 AM  ? ?

## 2021-05-13 NOTE — Progress Notes (Signed)
?   05/13/21 1325  ?Vitals  ?BP 119/80  ?Pulse Rate 69  ?Resp 20  ?During Hemodialysis Assessment  ?HD Safety Checks Performed Yes  ?Intra-Hemodialysis Comments Tolerated well  ?Post-Hemodialysis Assessment  ?Rinseback Volume (mL) 250 mL  ?KECN 223 V  ?Dialyzer Clearance Lightly streaked  ?Duration of HD Treatment -hour(s) 3.45 hour(s)  ?Hemodialysis Intake (mL) 500 mL  ?UF Total -Machine (mL) 3800 mL  ?Net UF (mL) 3300 mL  ?Tolerated HD Treatment Yes  ?Post-Hemodialysis Comments Tx completed without complications.  25 g Albumin at initation of tx. Net UF removed 3300  ?Education / Care Plan  ?Dialysis Education Provided Yes  ?Hemodialysis Catheter Right Internal jugular Double lumen Permanent (Tunneled)  ?Placement Date/Time: 12/10/20 1019   Time Out: Correct patient;Correct site;Correct procedure  Maximum sterile barrier precautions: Hand hygiene;Cap;Mask;Sterile gown;Sterile gloves;Large sterile sheet  Site Prep: Chlorhexidine (preferred);Skin Prep C...  ?Site Condition No complications  ?Blue Lumen Status Flushed;Saline locked  ?Red Lumen Status Flushed;Saline locked  ?Post treatment catheter status Capped and Clamped  ? ? ?

## 2021-05-13 NOTE — Progress Notes (Signed)
Admit: 05/04/2021 ?LOS: 9 ? ?65M ESRD THS [facility] with symptomatic anemia, sepsis, persistent hypotension req vasporessors, cirrhosis, FTT ? ?Subjective:  ? ?Remains Off pressors ?More awake and alert this AM ?Tells me he is just waiting for HD this AM ? ?No intake/output data recorded. ? ?Filed Weights  ? 05/11/21 1346 05/12/21 0354 05/13/21 0502  ?Weight: 115.3 kg 111.2 kg 116.4 kg  ? ? ?Scheduled Meds: ? allopurinol  200 mg Oral Daily  ? atorvastatin  20 mg Oral QHS  ? Chlorhexidine Gluconate Cloth  6 each Topical Daily  ? Chlorhexidine Gluconate Cloth  6 each Topical Q0600  ? docusate sodium  100 mg Oral BID  ? ferrous sulfate  325 mg Oral Q breakfast  ? hydrocortisone sod succinate (SOLU-CORTEF) inj  125 mg Intravenous Q12H  ? insulin aspart  0-6 Units Subcutaneous TID WC  ? lactulose  30 g Oral BID  ? levothyroxine  100 mcg Oral q morning  ? midodrine  20 mg Oral TID WC  ? nystatin cream   Topical BID  ? pantoprazole (PROTONIX) IV  40 mg Intravenous Q12H  ? rifaximin  550 mg Oral BID  ? sodium chloride flush  3 mL Intravenous Q12H  ? ?Continuous Infusions: ? sodium chloride    ? sodium chloride    ? sodium chloride    ? sodium chloride    ? ?PRN Meds:.sodium chloride, sodium chloride, sodium chloride, acetaminophen **OR** acetaminophen, alteplase, heparin, HYDROmorphone (DILAUDID) injection, ipratropium, levalbuterol, lidocaine (PF), lidocaine-prilocaine, ondansetron **OR** ondansetron (ZOFRAN) IV, oxyCODONE, pentafluoroprop-tetrafluoroeth, prochlorperazine, senna-docusate, sodium chloride flush, traMADol, traZODone ? ?Current Labs: reviewed  ? ? ?Physical Exam:  Blood pressure 113/62, pulse 82, temperature 97.8 ?F (36.6 ?C), resp. rate 17, height 5' 4"  (1.626 m), weight 116.4 kg, SpO2 94 %. ?Chronically ill appearing, NAD, lying in bed-  increased resp rate ?IRIR no rub ?CTAB on an auscultation ?Pitting LE edema ?Protuberant mild distension, mild TTP  ? ?Dialysis Orders: William Flores ?TTS 4hr EDW  108kg ?400/500 2/2.5 T35 ?Heparin 0 Mircera 230mg q2weeks (last 4/12) ?Left at 113.8kg on 4/20 ?AVF ? ?A ?ESRD THS  ?AoC hypotension, on midodrine 20 TID and NE stopped 05/10/21 ?Tolerated iHD with UF 05/11/21 off pressors ?Recent GIB, ABLA, Hb stable 10.6 ?Sepsis ? PNA, ABX per TRH ?Cirrhosis, GI following ?Pulm HTN on sildinafil, RV failure ?DM2 ?AFib ? ?P ?Cont HD on schedule as able, next Tx today:  2K, 3.5h, 400/600, UF 3L, AVF, no heparin; bolus albumin as needed; UF profile 2 ?If he has sig limiting IDH or other complications I think he is no longer a candidate and should transition to palliative/comfort measures ?Cont midodrine 20 mg 3 times daily  ?Cont to discuss palliative option, as above ?Anemia is stable with hgb over 10-  no ESA ?Phos OK no binder-  also does not appear to be on vit D or sensipar  ?Medication Issues; ?Preferred narcotic agents for pain control are hydromorphone, fentanyl, and methadone. Morphine should not be used.  ?Baclofen should be avoided ?Avoid oral sodium phosphate and magnesium citrate based laxatives / bowel preps  ? ?KLouis Meckel? ?05/13/2021, 8:10 AM ? ?Recent Labs  ?Lab 05/07/21 ?1417 05/08/21 ?0429 05/09/21 ?0697905/08/23 ?0430  ?NA 133* 132* 134* 133*  ?K 4.3 4.1 3.4* 4.5  ?CL 100 99 99 100  ?CO2 23 23 26  21*  ?GLUCOSE 130* 135* 128* 192*  ?BUN 35* 38* 20 28*  ?CREATININE 6.02* 6.42* 4.60* 5.89*  ?CALCIUM 7.9* 8.0*  8.2* 8.5*  ?PHOS 5.3*  --   --   --   ? ?Recent Labs  ?Lab 05/07/21 ?0436 05/07/21 ?1417 05/08/21 ?0429 05/09/21 ?4098 05/10/21 ?0430  ?WBC 5.6   < > 6.3 5.7 3.9*  ?NEUTROABS 3.6  --   --   --   --   ?HGB 10.5*   < > 9.8* 10.1* 10.6*  ?HCT 35.2*   < > 34.0* 35.1* 37.1*  ?MCV 103.2*   < > 104.0* 102.9* 105.4*  ?PLT 76*   < > 58* 51* 62*  ? < > = values in this interval not displayed.  ? ? ? ? ? ? ? ? ? ?  ?

## 2021-05-13 NOTE — Progress Notes (Signed)
Physical Therapy Treatment ?Patient Details ?Name: William Flores ?MRN: [id number] ?DOB: [birth year] ?Today's Date: 05/13/2021 ? ? ?History of Present Illness William Flores is a 71 year old male with multiple colors including ESRD on HD TTS, right-sided heart failure, hypotension, hypothyroidism, Nash-liver cirrhosis, chronic anemia, cytopenia, hypokalemia, DM II, A-fib  on Eliquis, obesity ...      Presenting today from [facility] with complaint of nausea vomiting and generalized weakness that started yesterday patient.  He refuses hemodialysis today.  Patient was noted for hypotension, and bradycardia therefore was sent to the ED for further evaluation ? ?  ?PT Comments  ? ? Patient continues to demonstrate need for mod/max assist with bed mobility with rolling, supine to sit and sit to supine. Patient is mod/max assist with sit to stand transfer but relies heavily on RW for balance and stability with lacking strength for push-off with B UE/LE. Bed to chair transfer attempted but patient demonstrates generalized weakness and unsteadiness with balance in which was not able to complete this task. Patient will benefit from continued skilled physical therapy in hospital and recommended venue below to increase strength, balance, endurance for safe ADLs and gait.  ?  ?Recommendations for follow up therapy are one component of a multi-disciplinary discharge planning process, led by the attending physician.  Recommendations may be updated based on patient status, additional functional criteria and insurance authorization. ? ?Follow Up Recommendations ? Skilled nursing-short term rehab (<3 hours/day) ?  ?  ?Assistance Recommended at Discharge Set up Supervision/Assistance  ?Patient can return home with the following A lot of help with walking and/or transfers;A lot of help with bathing/dressing/bathroom;Assistance with cooking/housework;Help with stairs or ramp for entrance ?  ?Equipment Recommendations ? None  recommended by PT  ?  ?Recommendations for Other Services   ? ? ?  ?Precautions / Restrictions Precautions ?Precautions: Fall ?Restrictions ?Weight Bearing Restrictions: No  ?  ? ?Mobility ? Bed Mobility ?Overal bed mobility: Needs Assistance ?Bed Mobility: Rolling, Sidelying to Sit, Sit to Supine ?Rolling: Mod assist, Max assist ?Sidelying to sit: Mod assist, Max assist ?Supine to sit: Mod assist, Max assist ?Sit to supine: Max assist, Mod assist ?  ?General bed mobility comments: Slow labored movement; assisted to push to sit from sidelying. Patient needs mod/max assist to initiate bed mobility and demonstrates generalized weakness and poor trunk control needed to perform task ?  ? ?Transfers ?Overall transfer level: Needs assistance ?Equipment used: Rolling walker (2 wheels) ?Transfers: Sit to/from Stand ?Sit to Stand: Mod assist, Max assist ?  ?  ?  ?  ?  ?General transfer comment: Patient is mod/max assist with sit to stand transfer with RW. Bed to chair transfer attempted but patient demonstrated generalized weakness, poor balance control and unsteadiness preventing patient from performing transfer. ?  ? ?Ambulation/Gait ?  ?  ?  ?  ?  ?  ?  ?  ? ? ?Stairs ?  ?  ?  ?  ?  ? ? ?Wheelchair Mobility ?  ? ?Modified Rankin (Stroke Patients Only) ?  ? ? ?  ?Balance Overall balance assessment: Needs assistance ?Sitting-balance support: Feet supported, No upper extremity supported ?Sitting balance-Leahy Scale: Fair ?Sitting balance - Comments: Patient has fair balance seated at EOB, but has poor trunk control resulting in posterior and lateral leaning to compensate. ?  ?Standing balance support: During functional activity, Bilateral upper extremity supported ?Standing balance-Leahy Scale: Poor ?Standing balance comment: Patient unable to perform single leg stance for brief time  period in assisting with transfers and standing balance. Patient utilizes RW, but demonstrates forward trunk lean and UE weakness needed to  stabilize themselves with the RW for overall balance. ?  ?  ?  ?  ?  ?  ?  ?  ?  ?  ?  ?  ? ?  ?Cognition Arousal/Alertness: Awake/alert ?Behavior During Therapy: [facility] for tasks assessed/performed ?Overall Cognitive Status: Within Functional Limits for tasks assessed ?  ?  ?  ?  ?  ?  ?  ?  ?  ?  ?  ?  ?  ?  ?  ?  ?  ?  ?  ? ?  ?Exercises General Exercises - Lower Extremity ?Long Arc Quad: Seated, AROM, Strengthening, Both, 10 reps ?Hip Flexion/Marching: AROM, Both, 5 reps, Seated ?Toe Raises: Seated, AROM, Strengthening, Both, 10 reps ?Heel Raises: Seated, AROM, Strengthening, Both, 10 reps ? ?  ?General Comments   ?  ?  ? ?Pertinent Vitals/Pain Pain Assessment ?Pain Assessment: Faces ?Faces Pain Scale: Hurts little more ?Pain Location: low back, R hip and leg ?Pain Descriptors / Indicators: Grimacing ?Pain Intervention(s): Limited activity within patient's tolerance, Monitored during session, Repositioned  ? ? ?Home Living   ?  ?  ?  ?  ?  ?  ?  ?  ?  ?   ?  ?Prior Function    ?  ?  ?   ? ?PT Goals (current goals can now be found in the care plan section) Acute Rehab PT Goals ?Patient Stated Goal: return home ?PT Goal Formulation: With patient ?Time For Goal Achievement: 05/21/21 ?Potential to Achieve Goals: Fair ?Progress towards PT goals: Progressing toward goals ? ?  ?Frequency ? ? ? Min 3X/week ? ? ? ?  ?PT Plan Current plan remains appropriate  ? ? ?Co-evaluation   ?  ?  ?  ?  ? ?  ?AM-PAC PT "6 Clicks" Mobility   ?Outcome Measure ? Help needed turning from your back to your side while in a flat bed without using bedrails?: A Lot ?Help needed moving from lying on your back to sitting on the side of a flat bed without using bedrails?: A Lot ?Help needed moving to and from a bed to a chair (including a wheelchair)?: A Lot ?Help needed standing up from a chair using your arms (e.g., wheelchair or bedside chair)?: A Lot ?Help needed to walk in hospital room?: A Lot ?Help needed climbing 3-5 steps with a railing? :  Total ?6 Click Score: 11 ? ?  ?End of Session Equipment Utilized During Treatment: Oxygen ?Activity Tolerance: Patient tolerated treatment well;Patient limited by fatigue ?Patient left: in bed;with call bell/phone within reach ?Nurse Communication: Mobility status ?PT Visit Diagnosis: Unsteadiness on feet (R26.81);Other abnormalities of gait and mobility (R26.89);Muscle weakness (generalized) (M62.81) ?  ? ? ?Time: 7017-7939 ?PT Time Calculation (min) (ACUTE ONLY): 27 min ? ?Charges:  $Therapeutic Exercise: 8-22 mins ?$Therapeutic Activity: 8-22 mins          ?          ? ?3:13 PM, 05/13/21 ?Lestine Box, S/PT  ? ?

## 2021-05-14 DIAGNOSIS — Z66 Do not resuscitate: Secondary | ICD-10-CM | POA: Diagnosis not present

## 2021-05-14 DIAGNOSIS — Z7189 Other specified counseling: Secondary | ICD-10-CM | POA: Diagnosis not present

## 2021-05-14 DIAGNOSIS — Z515 Encounter for palliative care: Secondary | ICD-10-CM | POA: Diagnosis not present

## 2021-05-14 DIAGNOSIS — I959 Hypotension, unspecified: Secondary | ICD-10-CM | POA: Diagnosis not present

## 2021-05-14 LAB — COMPREHENSIVE METABOLIC PANEL
ALT: 24 U/L (ref 0–44)
AST: 20 U/L (ref 15–41)
Albumin: 3.4 g/dL — ABNORMAL LOW (ref 3.5–5.0)
Alkaline Phosphatase: 59 U/L (ref 38–126)
Anion gap: 12 (ref 5–15)
BUN: 35 mg/dL — ABNORMAL HIGH (ref 8–23)
CO2: 22 mmol/L (ref 22–32)
Calcium: 8.2 mg/dL — ABNORMAL LOW (ref 8.9–10.3)
Chloride: 97 mmol/L — ABNORMAL LOW (ref 98–111)
Creatinine, Ser: 5.36 mg/dL — ABNORMAL HIGH (ref 0.61–1.24)
GFR, Estimated: 11 mL/min — ABNORMAL LOW (ref >60)
Glucose, Bld: 195 mg/dL — ABNORMAL HIGH (ref 70–99)
Potassium: 3.8 mmol/L (ref 3.5–5.1)
Sodium: 131 mmol/L — ABNORMAL LOW (ref 135–145)
Total Bilirubin: 1.8 mg/dL — ABNORMAL HIGH (ref 0.3–1.2)
Total Protein: 6.3 g/dL — ABNORMAL LOW (ref 6.5–8.1)

## 2021-05-14 LAB — CBC
HCT: 38.3 % — ABNORMAL LOW (ref 39.0–52.0)
Hemoglobin: 11.4 g/dL — ABNORMAL LOW (ref 13.0–17.0)
MCH: 31 pg (ref 26.0–34.0)
MCHC: 29.8 g/dL — ABNORMAL LOW (ref 30.0–36.0)
MCV: 104.1 fL — ABNORMAL HIGH (ref 80.0–100.0)
Platelets: 66 10*3/uL — ABNORMAL LOW (ref 150–400)
RBC: 3.68 MIL/uL — ABNORMAL LOW (ref 4.22–5.81)
RDW: 26.3 % — ABNORMAL HIGH (ref 11.5–15.5)
WBC: 6.8 10*3/uL (ref 4.0–10.5)
nRBC: 0.7 % — ABNORMAL HIGH (ref 0.0–0.2)

## 2021-05-14 LAB — PROTIME-INR
INR: 1.6 — ABNORMAL HIGH (ref 0.8–1.2)
Prothrombin Time: 18.7 seconds — ABNORMAL HIGH (ref 11.4–15.2)

## 2021-05-14 LAB — GLUCOSE, CAPILLARY
Glucose-Capillary: 209 mg/dL — ABNORMAL HIGH (ref 70–99)
Glucose-Capillary: 206 mg/dL — ABNORMAL HIGH (ref 70–99)
Glucose-Capillary: 164 mg/dL — ABNORMAL HIGH (ref 70–99)
Glucose-Capillary: 173 mg/dL — ABNORMAL HIGH (ref 70–99)

## 2021-05-14 MED ORDER — CHLORHEXIDINE GLUCONATE CLOTH 2 % EX PADS: 6.0000 | MEDICATED_PAD | Freq: Every day | CUTANEOUS | Status: DC

## 2021-05-14 NOTE — Progress Notes (Signed)
?PROGRESS NOTE ? ? ? ?GEARALD Flores  BWI:[id number] DOB: 24-Feb-1950 DOA: 05/04/2021 ?PCP: Flores, William Kaufmann, MD  ? ?  ?Brief Narrative:  ?William Flores is a 71 year old male with multiple medical comorbidities including ESRD on HD TTS, right-sided heart failure, hypotension, hypothyroidism, Nash-liver cirrhosis, chronic anemia, cytopenia, hypokalemia, DM II, A-fib on Eliquis, obesity who presents from [facility] with complaint of nausea, vomiting, and generalized weakness that started yesterday.  Due to his symptoms, patient did not undergo dialysis as scheduled on 5/2.  In the emergency department, patient met sepsis criteria, concerning for multifocal pneumonia.  Patient was started on IV antibiotics and admitted to the hospital.  Nephrology, cardiology, pulmonology, palliative care consulted. ? ?Hospitalization has been complicated by respiratory failure, requiring high flow oxygen.  Episode of hematemesis and blood loss anemia requiring blood transfusion.  Altered mental status.  Hypotension requiring Levophed. ? ?New events last 24 hours / Subjective: ?Patient is fatigued but alert and conversational.  Underwent dialysis last night without issue.  Remains on Levophed today.  Discussed with him his critical illness, poor prognosis, hospice.  When asked about how he would feel about stopping dialysis and transitioning to hospice he tells me "I don't know" ? ?I had a conversation over the phone with step-daughter, William Flores, who I have been updating daily.  Patient remains with very poor prognosis overall.  Remains on Levophed, has been on Levophed for 4 days, unable to wean.  At this point, it is unlikely that patient would be able to tolerate outpatient dialysis.  Recommended to her to discuss end-of-life, comfort care, hospice with patient today.  Palliative care, nephrology, cardiology services will be back on Monday.  Can see if they have any further recommendations, but I suspect that recommendation would  be to transition to hospice.  His prognosis is grave, likely days.  ? ?05/11/21: Somnolent/sleepy but able to follow simple commands.  Currently off pressors and with at times later today for hemodialysis.  Follow clinical response.  Overall condition continues to be poor with very guarded outcome at this point.  Continue to follow assistance by nephrology service and palliative care. ? ?5/10: Well-tolerated hemodialysis almost 3 L removed.  Currently reporting no chest pain and feeling better.  Still requiring 2 L nasal cannula supplementation and having signs of fluid overload on exam.  Next dialysis treatment anticipated for 05/13/2021.  We will continue following 1 day at a time.  Appreciate assistance and recommendation by nephrology service and palliative care service. ? ?5/11: Well-tolerated hemodialysis; 3.3 L removed.  No chest pain, no nausea, no vomiting, no abdominal pain.  No overnight events. ? ?5/12: In good spirit.  2 L nasal cannula supplementation in place.  Reports no nausea, no vomiting, no fever.  Hoping for another dialysis treatment without the need of any pressor support.  If that the case will coordinate for discharge back to skilled nursing facility on 05/16/2021.  TOC has been made aware. ? ?Assessment & Plan: ?Principal Problem: ?  Septic shock (William Flores) ?Active Problems: ?  ESRD (end stage renal disease) on dialysis [facility]) ?  Hypotension ?  Elevated troponin ?  Chronic right heart failure (HCC) ?  OSA (obstructive sleep apnea) ?  Atrial fibrillation (William Flores) ?  Chronic respiratory failure with hypoxia (HCC) ?  Gout ?  History of colon cancer ?  DM (diabetes mellitus), type 2 with renal complications (William Flores) ?  Mixed hyperlipidemia ?  Iron deficiency anemia ?  Liver cirrhosis secondary to  NASH (William Flores) ?  Morbid obesity (William Flores) ?  Acute respiratory failure with hypoxia (William Flores) ?  Nonischemic cardiomyopathy (William Flores) ?  Pulmonary hypertension, unspecified (William Flores) ?  GERD (gastroesophageal reflux disease) ?  Nausea  and vomiting ?  Pain of upper abdomen ?  Constipation ?  Ischemic hepatitis ?  Hematemesis ? ? ?Septic shock secondary to multifocal pneumonia ?-He does have chronic hypotension on midodrine (specially around hemodialysis treatment). ?-MRSA PCR negative ?-Patient has completed IV antibiotics while inpatient. ?-Continue midodrine on the use of Solu-Cortef. ?-Currently off Levophed; blood pressure marginal with a systolic blood pressure in the 90s.  Will assess patient tolerance of hemodialysis without pressors support. ?-Condition is critical, overall poor prognosis and guarded outcome. ?-Outpatient follow-up with palliative care. ? ?Acute on chronic hypoxemic respiratory failure ?-Chronically using 2 L nasal cannula O2 at baseline ?-Insetting of multifocal pneumonia, missed dialysis sessions and vascular congestion/interstitial edema due to right-sided heart failure. ?-CXR showing worsening findings of CHF or fluid overload, increased moderate pleural effusions, persistent left basilar consolidation with bilateral increased opacities in the right-greater-than-left consistent with edema, ?pneumonia or combination ?-Continue volume management per hemodialysis as tolerated ?-Patient has completed a total of 7 days of antibiotics and is currently afebrile. ?-Continue supportive care and wean off oxygen supplementation as tolerated. ?-Patient is afebrile and is stable. ? ?Hematemesis, acute blood loss anemia ?-Transfused 2 unit packed red blood cells 5/3  ?-Continue PPI ?-Continue to hold Eliquis/IV heparin ?-Appreciate assistance/recommendations by GI service.    ?-No plans for endoscopy currently.   ? ?ESRD on HD TTS ?-Nephrology following ?-HD 5/4, 5/6; first dialysis without the need of pressors on 05/11/2021 and 05/13/21 almost 6.3 L removed and well-tolerated. ?-Will continue to closely follow his tolerance and recommendations by nephrology service. ?-Next anticipated treatment 05/15/2021. ?-Okay to continue midodrine  and Solu-Cortef. ? ?Acute metabolic encephalopathy ?-Oriented x3 and following commands appropriately: ?-Continue constant reorientation and supportive care ?-Continue to follow response. ? ?Chronic A-fib ?-Eliquis/IV heparin on hold due to hematemesis ?-Continue holding metoprolol in the setting of hypotension. ?-Rate controlled and stable currently. ?-Continue telemetry monitoring. ? ?Chronic diastolic heart failure/RV failure  ?NICM ?-Continue volume management with hemodialysis; tolerated almost 3.3 L removal with dialysis on 05/13/2021; next treatment 05/15/2021. ?-Patient has not required pressor supports. ?-Continue to closely follow clinical response and stability. ?-If patient tolerates dialysis on 05/15/2021, will plan to discharge back to skilled nursing facility on 05/16/2021. ?-Cardiology has signed off at this point at this no other treatment that they can offer him. ?-Continue to follow daily weights/strict I's and O's. ? ?Pulmonary hypertension ?-Continue the use of sildenafil. ? ?NASH cirrhosis with elevated LFTs ?-Continue rifaximin and lactulose. ?-Mentation is normal and is stable.. ?-Will follow recommendations by GI; overall very poor prognosis ?-Concerned that his elevated LFTs are secondary to hepatic congestion. ?-Continue to follow LFTs trend. ?-Continue volume management per hemodialysis. ? ?Nausea, vomiting, abdominal pain ?-CT abdomen pelvis revealed possible acute pancreatitis, lipase 19 ?-Patient reports no nausea or vomiting currently; also denies abdominal pain.  Tolerating diet. ?-Continue supportive care and follow clinical response. ?-Continue as needed antiemetics. ? ?Diabetes mellitus type 2 with renal complications ?-H5K 4.3 ?-Continue SSI ?-Continue to follow CBG fluctuation. ?-Very likely elevated in the setting of Solu-Cortef usage. ? ?Demand ischemia ?-In setting of hypotension, ESRD ?-Echocardiogram showed EF 60 to 65%, no regional wall motion abnormality, RV pressure overload  with reduced RV systolic function; going along with severe pulmonary hypertension and right heart failure. ?-Cardiology service recommending  transition to hospice and comfort care if unable to tolerat

## 2021-05-14 NOTE — TOC Progression Note (Signed)
Transition of Care (TOC) - Progression Note  ? ? ?Patient Details  ?Name: William Flores ?MRN: [id number] ?Date of Birth: [birth year] ? ?Transition of Care (TOC) CM/SW Contact  ?Joaquin Courts, RN ?Phone Number: ?05/14/2021, 12:17 PM ? ?Clinical Narrative:    ?CM spoke with Melissa at [facility] who confirms patient can return to facility on Sunday 5/14 (per MD anticipate medical stability for discharge on this date). FL2 sent to facility via hub, facility will need a dc summary sent on day of discharge.  Please contact Melissa at [phone] for a weekend discharge. ? ? ?Expected Discharge Plan: [place] ?Barriers to Discharge: Continued Medical Work up ? ?Expected Discharge Plan and Services ?Expected Discharge Plan: [place] ?In-house Referral: Clinical Social Work ?  ?Post Acute Care Choice: Resumption of Svcs/PTA Provider ?Living arrangements for the past 2 months: [place] ?                ?  ?  ?  ?  ?  ?  ?  ?  ?  ?  ? ? ?Social Determinants of Health (SDOH) Interventions ?  ? ?Readmission Risk Interventions ? ?  05/05/2021  ? 11:32 AM 04/23/2021  ?  1:14 PM 11/23/2020  ? 10:58 AM  ?Readmission Risk Prevention Plan  ?Transportation Screening Complete Complete Complete  ?Medication Review Press photographer) Complete Complete Complete  ?PCP or Specialist appointment within 3-5 days of discharge Complete  Complete  ?[place] or Home Care Consult Complete Complete Complete  ?SW Recovery Care/Counseling Consult Complete Complete Complete  ?Palliative Care Screening Not Applicable Not Applicable   ?Skilled Nursing Facility Complete Complete Complete  ? ? ?

## 2021-05-14 NOTE — [place] FL2 (Signed)
?[place] MEDICAID FL2 LEVEL OF CARE SCREENING TOOL  ?  ? ?IDENTIFICATION  ?Patient Name: ?William Flores Birthdate: 1950-01-19 Sex: male Admission Date (Current Location): ?05/04/2021  ?[place] and [place] Number: ? [place] and Address:  ?[facility] [address] [place] ?     Provider Number: ?[id number]  ?Attending Physician Name and Address:  ?Barton Dubois, MD ? Relative Name and Phone Number:  ?  ?   ?Current Level of Care: ?Hospital Recommended Level of Care: ?[place] Prior Approval Number: ?  ? ?Date Approved/Denied: ?  PASRR Number: ?[id number] A ? ?Discharge Plan: ?SNF ?  ? ?Current Diagnoses: ?Patient Active Problem List  ? Diagnosis Date Noted  ? Ischemic hepatitis   ? Hematemesis   ? Septic shock ([place]) 05/04/2021  ? Hypotension 05/04/2021  ? Elevated troponin 05/04/2021  ? Nausea and vomiting   ? Pain of upper abdomen   ? Constipation   ? Acute GI bleeding 04/22/2021  ? Gross hematuria 02/02/2021  ? Balanitis 02/02/2021  ? ESRD (end stage renal disease) on dialysis ([place]) 02/02/2021  ? Pressure injury of skin 11/25/2020  ? Acute exacerbation of CHF (congestive heart failure) ([place]) 11/22/2020  ? Elevated brain natriuretic peptide (BNP) level 11/22/2020  ? Thrombocytopenia ([place]) 11/22/2020  ? GERD (gastroesophageal reflux disease) 11/22/2020  ? Insomnia 11/22/2020  ? Ascites 11/12/2020  ? Acquired thrombophilia ([place]) 10/14/2020  ? Liver cirrhosis secondary to NASH ([place]) 10/14/2020  ? Iron deficiency anemia 07/21/2020  ? Syncope and collapse   ? Syncope 06/19/2020  ? Hypokalemia 06/19/2020  ? Cor pulmonale, acute ([place]) 04/15/2019  ? Bilateral leg edema   ? Acute on chronic congestive heart failure ([place]) 03/29/2019  ? Pulmonary hypertension, unspecified ([place]) 12/28/2018  ? Mixed hyperlipidemia 05/18/2017  ? Morbid obesity with BMI of 45.0-49.9, adult ([place]) 05/18/2017  ? MGUS (monoclonal gammopathy of unknown significance) 05/27/2015  ? Acute on  chronic anemia 11/04/2014  ? Peripheral edema   ? Essential hypertension   ? Chronic respiratory failure with hypoxia (H[facility])   ? DM (diabetes mellitus), type 2 with renal complications ([place]) 36/14/4315  ? Atrial fibrillation ([place]) 04/14/2014  ? Nonischemic cardiomyopathy ([place]) 02/20/2014  ? OSA (obstructive sleep apnea)   ? Chronic right heart failure ([place]) 02/10/2014  ? Acute respiratory failure with hypoxia ([place]) 10/28/2013  ? Morbid obesity ([place]) 08/05/2013  ? History of colon cancer   ? Gout 05/25/2012  ? ? ?Orientation RESPIRATION BLADDER Height & Weight   ?  ?Self, Time, Place ? O2 (see dc summary) Continent Weight: 112 kg ?Height:  5' 4"  (162.6 cm)  ?BEHAVIORAL SYMPTOMS/MOOD NEUROLOGICAL BOWEL NUTRITION STATUS  ?    Continent Diet (see dc summary)  ?AMBULATORY STATUS COMMUNICATION OF NEEDS Skin   ?Extensive Assist Verbally Normal ?  ?  ?  ?    ?     ?     ? ? ?Personal Care Assistance Level of Assistance  ?Bathing, Feeding, Dressing Bathing Assistance: Maximum assistance ?Feeding assistance: Maximum assistance ?Dressing Assistance: Maximum assistance ?   ? ?Functional Limitations Info  ?Sight, Hearing, Speech Sight Info: Adequate ?Hearing Info: Impaired ?Speech Info: Adequate  ? ? ?SPECIAL CARE FACTORS FREQUENCY  ?    ?  ?  ?  ?  ?  ?  ?   ? ? ?Contractures Contractures Info: Not present  ? ? ?Additional Factors Info  ?Code Status, Allergies Code Status Info: Full ?Allergies Info: Codeine ?  ?  ?  ?   ? ?  Current Medications (05/14/2021):  This is the current hospital active medication list ?Current Facility-Administered Medications  ?Medication Dose Route Frequency Provider Last Rate Last Admin  ? 0.9 %  sodium chloride infusion  250 mL Intravenous PRN Barton Dubois, MD      ? 0.9 %  sodium chloride infusion  250 mL Intravenous Continuous Barton Dubois, MD      ? 0.9 %  sodium chloride infusion  100 mL Intravenous PRN Barton Dubois, MD      ? 0.9 %  sodium chloride infusion  100 mL Intravenous PRN Barton Dubois, MD      ? acetaminophen (TYLENOL) tablet 650 mg  650 mg Oral Q6H PRN Barton Dubois, MD      ? Or  ? acetaminophen (TYLENOL) suppository 650 mg  650 mg Rectal Q6H PRN Barton Dubois, MD      ? allopurinol (ZYLOPRIM) tablet 200 mg  200 mg Oral Daily Barton Dubois, MD   200 mg at 05/14/21 1062  ? alteplase (CATHFLO ACTIVASE) injection 2 mg  2 mg Intracatheter Once PRN Barton Dubois, MD      ? atorvastatin (LIPITOR) tablet 20 mg  20 mg Oral QHS Barton Dubois, MD   20 mg at 05/13/21 2102  ? Chlorhexidine Gluconate Cloth 2 % PADS 6 each  6 each Topical Q0600 Barton Dubois, MD   6 each at 05/14/21 0543  ? docusate sodium (COLACE) capsule 100 mg  100 mg Oral BID Barton Dubois, MD   100 mg at 05/14/21 6948  ? ferrous sulfate tablet 325 mg  325 mg Oral Q breakfast Barton Dubois, MD   325 mg at 05/14/21 5462  ? heparin injection 1,000 Units  1,000 Units Dialysis PRN Barton Dubois, MD   1,000 Units at 05/08/21 2359  ? hydrocortisone sodium succinate (SOLU-CORTEF) injection 125 mg  125 mg Intravenous Q12H Barton Dubois, MD   125 mg at 05/13/21 1850  ? HYDROmorphone (DILAUDID) injection 0.5-1 mg  0.5-1 mg Intravenous Q3H PRN Barton Dubois, MD   1 mg at 05/11/21 0601  ? insulin aspart (novoLOG) injection 0-6 Units  0-6 Units Subcutaneous TID WC Barton Dubois, MD   2 Units at 05/14/21 1204  ? ipratropium (ATROVENT) nebulizer solution 0.5 mg  0.5 mg Nebulization Q6H PRN Barton Dubois, MD      ? lactulose ([name]) 10 GM/15ML solution 30 g  30 g Oral BID Barton Dubois, MD   30 g at 05/14/21 7035  ? levalbuterol (XOPENEX) nebulizer solution 0.63 mg  0.63 mg Nebulization Q6H PRN Barton Dubois, MD      ? levothyroxine (SYNTHROID) tablet 100 mcg  100 mcg Oral q morning Barton Dubois, MD   100 mcg at 05/14/21 0093  ? lidocaine (PF) (XYLOCAINE) 1 % injection 5 mL  5 mL Intradermal PRN Barton Dubois, MD      ? lidocaine-prilocaine (EMLA) cream 1 application.  1 application. Topical PRN Barton Dubois, MD      ?  midodrine (PROAMATINE) tablet 20 mg  20 mg Oral TID WC Barton Dubois, MD   20 mg at 05/14/21 1207  ? nystatin cream (MYCOSTATIN)   Topical BID Barton Dubois, MD   Given at 05/14/21 [phone]  ? ondansetron (ZOFRAN) tablet 4 mg  4 mg Oral Q6H PRN Barton Dubois, MD      ? Or  ? ondansetron [facility]) injection 4 mg  4 mg Intravenous Q6H PRN Barton Dubois, MD   4 mg at 05/10/21 1702  ? oxyCODONE (Oxy IR/ROXICODONE)  immediate release tablet 5 mg  5 mg Oral Q6H PRN Barton Dubois, MD   5 mg at 05/10/21 1556  ? pantoprazole (PROTONIX) injection 40 mg  40 mg Intravenous Q12H Barton Dubois, MD   40 mg at 05/14/21 0831  ? pentafluoroprop-tetrafluoroeth (GEBAUERS) aerosol 1 application.  1 application. Topical PRN Barton Dubois, MD      ? prochlorperazine (COMPAZINE) injection 10 mg  10 mg Intravenous Q6H PRN Barton Dubois, MD   10 mg at 05/12/21 1853  ? rifaximin (XIFAXAN) tablet 550 mg  550 mg Oral BID Barton Dubois, MD   550 mg at 05/14/21 8756  ? senna-docusate (Senokot-S) tablet 1 tablet  1 tablet Oral QHS PRN Barton Dubois, MD      ? sodium chloride flush (NS) 0.9 % injection 3 mL  3 mL Intravenous Q12H Barton Dubois, MD   3 mL at 05/13/21 2102  ? sodium chloride flush (NS) 0.9 % injection 3 mL  3 mL Intravenous PRN Barton Dubois, MD      ? traMADol Veatrice Bourbon) tablet 50 mg  50 mg Oral Q6H PRN Barton Dubois, MD      ? traZODone (DESYREL) tablet 25 mg  25 mg Oral QHS PRN Barton Dubois, MD   25 mg at 05/13/21 2102  ? ?Facility-Administered Medications Ordered in Other Encounters  ?Medication Dose Route Frequency Provider Last Rate Last Admin  ? 0.9 %  sodium chloride infusion  100 mL Intravenous PRN Dwana Melena, MD      ? 0.9 %  sodium chloride infusion  100 mL Intravenous PRN Dwana Melena, MD      ? alteplase (CATHFLO ACTIVASE) injection 2 mg  2 mg Intracatheter Once PRN Dwana Melena, MD      ? heparin injection 1,000 Units  1,000 Units Dialysis PRN Dwana Melena, MD      ? lidocaine (PF) (XYLOCAINE) 1 % injection 5  mL  5 mL Intradermal PRN Dwana Melena, MD      ? lidocaine-prilocaine (EMLA) cream 1 application.  1 application. Topical PRN Dwana Melena, MD      ? pentafluoroprop-tetrafluoroeth Landry Dyke) aerosol 1 applicati

## 2021-05-14 NOTE — Progress Notes (Signed)
Admit: 05/04/2021 ?LOS: 10 ? ?78M ESRD THS [facility] with symptomatic anemia, sepsis, persistent hypotension req vasporessors, cirrhosis, FTT ? ?Subjective:  ? ?Remains Off pressors ?Were able to do HD yesterday with removal of 3300-  no BP drop-  only required one dose of albumin ? ?05/11 0701 - 05/12 0700 ?In: 960 [P.O.:960] ?Out: 3301 [Stool:1] ? ?Filed Weights  ? 05/13/21 0502 05/13/21 0930 05/14/21 0510  ?Weight: 116.4 kg 116.4 kg 112 kg  ? ? ?Scheduled Meds: ? allopurinol  200 mg Oral Daily  ? atorvastatin  20 mg Oral QHS  ? Chlorhexidine Gluconate Cloth  6 each Topical Daily  ? Chlorhexidine Gluconate Cloth  6 each Topical Q0600  ? docusate sodium  100 mg Oral BID  ? ferrous sulfate  325 mg Oral Q breakfast  ? hydrocortisone sod succinate (SOLU-CORTEF) inj  125 mg Intravenous Q12H  ? insulin aspart  0-6 Units Subcutaneous TID WC  ? lactulose  30 g Oral BID  ? levothyroxine  100 mcg Oral q morning  ? midodrine  20 mg Oral TID WC  ? nystatin cream   Topical BID  ? pantoprazole (PROTONIX) IV  40 mg Intravenous Q12H  ? rifaximin  550 mg Oral BID  ? sodium chloride flush  3 mL Intravenous Q12H  ? ?Continuous Infusions: ? sodium chloride    ? sodium chloride    ? sodium chloride    ? sodium chloride    ? ?PRN Meds:.sodium chloride, sodium chloride, sodium chloride, acetaminophen **OR** acetaminophen, alteplase, heparin, HYDROmorphone (DILAUDID) injection, ipratropium, levalbuterol, lidocaine (PF), lidocaine-prilocaine, ondansetron **OR** ondansetron (ZOFRAN) IV, oxyCODONE, pentafluoroprop-tetrafluoroeth, prochlorperazine, senna-docusate, sodium chloride flush, traMADol, traZODone ? ?Current Labs: reviewed  ? ? ?Physical Exam:  Blood pressure 115/75, pulse 77, temperature 97.8 ?F (36.6 ?C), resp. rate 18, height 5' 4"  (1.626 m), weight 112 kg, SpO2 99 %. ?Chronically ill appearing, NAD, lying in bed-  increased resp rate ?IRIR no rub ?CTAB on an auscultation ?Pitting LE edema ?Protuberant mild distension, mild TTP   ? ?Dialysis Orders: [facility] ?TTS 4hr EDW 108kg ?400/500 2/2.5 T35 ?Heparin 0 Mircera 241mg q2weeks (last 4/12) ?Left at 113.8kg on 4/20 ?AVF ? ?A ?ESRD THS  ?AoC hypotension, on midodrine 20 TID and NE stopped 05/10/21 ?Tolerated iHD with UF 05/11/21 and 5/11 off pressors ?Recent GIB, ABLA, Hb stable 10.6 ?Sepsis ? PNA, ABX per TRH ?Cirrhosis, GI following ?Pulm HTN on sildinafil, RV failure ?DM2 ?AFib ?Pt still quite debilitated  ? ?P ?Cont HD on schedule as able, next Tx tomorrow:  2K, 3.5h, 400/600, UF 3L + AVF, no heparin; bolus albumin as needed; UF profile 2 ?If he has sig limiting IDH or other complications I think he is no longer a candidate and should transition to palliative/comfort measures-  has tolerated 2 tx with good UF achieved-  will do tomorrow without any albumin to simulate OP HD-   would be comfortable with transfer back to SNF whenever OK with primary  ?Cont midodrine 20 mg 3 times daily  ?Cont to discuss palliative option-  but on hold right now ?Anemia is stable with hgb over 10-  no ESA ?Phos OK no binder-  also does not appear to be on vit D or sensipar  ?Medication Issues; ?Preferred narcotic agents for pain control are hydromorphone, fentanyl, and methadone. Morphine should not be used.  ?Baclofen should be avoided ?Avoid oral sodium phosphate and magnesium citrate based laxatives / bowel preps  ?Pt will not be physically seen over the  weekend- HD will be arranged for Sat-  call with questions over the weekend ? ?Louis Meckel ? ?05/14/2021, 7:51 AM ? ?Recent Labs  ?Lab 05/07/21 ?1417 05/08/21 ?0429 05/09/21 ?7322 05/10/21 ?0430 05/13/21 ?0439  ?NA 133*   < > 134* 133* 131*  ?K 4.3   < > 3.4* 4.5 3.9  ?CL 100   < > 99 100 97*  ?CO2 23   < > 26 21* 24  ?GLUCOSE 130*   < > 128* 192* 213*  ?BUN 35*   < > 20 28* 38*  ?CREATININE 6.02*   < > 4.60* 5.89* 6.35*  ?CALCIUM 7.9*   < > 8.2* 8.5* 8.5*  ?PHOS 5.3*  --   --   --  5.4*  ? < > = values in this interval not displayed.  ? ?Recent  Labs  ?Lab 05/09/21 ?5672 05/10/21 ?0430 05/13/21 ?0439  ?WBC 5.7 3.9* 6.6  ?HGB 10.1* 10.6* 11.0*  ?HCT 35.1* 37.1* 36.5*  ?MCV 102.9* 105.4* 103.7*  ?PLT 51* 62* 71*  ? ? ? ? ? ? ? ? ? ?  ?

## 2021-05-14 NOTE — Progress Notes (Signed)
Physical Therapy Treatment ?Patient Details ?Name: William Flores ?MRN: [id number] ?DOB: [birth year] ?Today's Date: 05/14/2021 ? ? ?History of Present Illness JAELAN RASHEED is a 71 year old male with multiple colors including ESRD on HD TTS, right-sided heart failure, hypotension, hypothyroidism, Nash-liver cirrhosis, chronic anemia, cytopenia, hypokalemia, DM II, A-fib  on Eliquis, obesity ...      Presenting today from [facility] with complaint of nausea vomiting and generalized weakness that started yesterday patient.  He refuses hemodialysis today.  Patient was noted for hypotension, and bradycardia therefore was sent to the ED for further evaluation ? ?  ?PT Comments  ? ? Patient demonstrates slight improvement for functional mobility after having dialysis yesterday, but continues to have difficulty sitting up at bedside, required repeated attempts before able to complete sit to stands, unable to transfer to chair due to poor standing balance and fall risk.  Patient demonstrates fair return for scooting laterally at bedside with Mod assist and required Mod assist to get back into bed.  Patient will benefit from continued skilled physical therapy in hospital and recommended venue below to increase strength, balance, endurance for safe ADLs and gait.  ?  ?Recommendations for follow up therapy are one component of a multi-disciplinary discharge planning process, led by the attending physician.  Recommendations may be updated based on patient status, additional functional criteria and insurance authorization. ? ?Follow Up Recommendations ? Skilled nursing-short term rehab (<3 hours/day) ?  ?  ?Assistance Recommended at Discharge Set up Supervision/Assistance  ?Patient can return home with the following A lot of help with walking and/or transfers;A lot of help with bathing/dressing/bathroom;Assistance with cooking/housework;Help with stairs or ramp for entrance ?  ?Equipment Recommendations ? None recommended by  PT  ?  ?Recommendations for Other Services   ? ? ?  ?Precautions / Restrictions Precautions ?Precautions: Fall ?Restrictions ?Weight Bearing Restrictions: No  ?  ? ?Mobility ? Bed Mobility ?Overal bed mobility: Needs Assistance ?Bed Mobility: Rolling, Sidelying to Sit ?Rolling: Mod assist ?Sidelying to sit: Mod assist ?  ?  ?  ?General bed mobility comments: slow labored movement requring use of bed rail to complete sidelying to sitting ?  ? ?Transfers ?Overall transfer level: Needs assistance ?Equipment used: Rolling walker (2 wheels) ?Transfers: Sit to/from Stand ?Sit to Stand: Mod assist ?  ?  ?  ?  ?  ?General transfer comment: required repeated attempts before able to complete sit to stand, unable to transfer to chair due to weakness ?  ? ?Ambulation/Gait ?Ambulation/Gait assistance: Mod assist, Max assist ?Gait Distance (Feet): 2 Feet ?Assistive device: Rolling walker (2 wheels) ?Gait Pattern/deviations: Decreased step length - right, Decreased step length - left, Decreased stride length, Trunk flexed ?Gait velocity: decreased ?  ?  ?General Gait Details: limited to a couple of slow labored side steps with near loss of balance due to BLE weakness ? ? ?Stairs ?  ?  ?  ?  ?  ? ? ?Wheelchair Mobility ?  ? ?Modified Rankin (Stroke Patients Only) ?  ? ? ?  ?Balance Overall balance assessment: Needs assistance ?Sitting-balance support: Feet supported, No upper extremity supported ?Sitting balance-Leahy Scale: Fair ?Sitting balance - Comments: fair/good seated at EOB ?  ?Standing balance support: Reliant on assistive device for balance, During functional activity, Bilateral upper extremity supported ?Standing balance-Leahy Scale: Poor ?Standing balance comment: using RW ?  ?  ?  ?  ?  ?  ?  ?  ?  ?  ?  ?  ? ?  ?  Cognition Arousal/Alertness: Awake/alert ?Behavior During Therapy: [facility] for tasks assessed/performed ?Overall Cognitive Status: Within Functional Limits for tasks assessed ?  ?  ?  ?  ?  ?  ?  ?  ?  ?  ?  ?  ?   ?  ?  ?  ?  ?  ?  ? ?  ?Exercises General Exercises - Lower Extremity ?Long Arc Quad: Seated, AROM, Strengthening, Both, 5 reps ?Hip Flexion/Marching: AROM, Both, 5 reps, Seated ?Toe Raises: Seated, AROM, Strengthening, Both, 10 reps ?Heel Raises: Seated, AROM, Strengthening, Both, 10 reps ? ?  ?General Comments   ?  ?  ? ?Pertinent Vitals/Pain Pain Assessment ?Pain Assessment: Faces ?Faces Pain Scale: Hurts a little bit ?Pain Location: low back, R hip and leg ?Pain Descriptors / Indicators: Grimacing ?Pain Intervention(s): Limited activity within patient's tolerance, Monitored during session, Repositioned  ? ? ?Home Living   ?  ?  ?  ?  ?  ?  ?  ?  ?  ?   ?  ?Prior Function    ?  ?  ?   ? ?PT Goals (current goals can now be found in the care plan section) Acute Rehab PT Goals ?Patient Stated Goal: return home ?PT Goal Formulation: With patient ?Time For Goal Achievement: 05/21/21 ?Potential to Achieve Goals: Fair ?Progress towards PT goals: Progressing toward goals ? ?  ?Frequency ? ? ? Min 3X/week ? ? ? ?  ?PT Plan Current plan remains appropriate  ? ? ?Co-evaluation   ?  ?  ?  ?  ? ?  ?AM-PAC PT "6 Clicks" Mobility   ?Outcome Measure ? Help needed turning from your back to your side while in a flat bed without using bedrails?: A Lot ?Help needed moving from lying on your back to sitting on the side of a flat bed without using bedrails?: A Lot ?Help needed moving to and from a bed to a chair (including a wheelchair)?: A Lot ?Help needed standing up from a chair using your arms (e.g., wheelchair or bedside chair)?: A Lot ?Help needed to walk in hospital room?: A Lot ?Help needed climbing 3-5 steps with a railing? : Total ?6 Click Score: 11 ? ?  ?End of Session Equipment Utilized During Treatment: Oxygen ?Activity Tolerance: Patient tolerated treatment well;Patient limited by fatigue ?Patient left: in bed;with call bell/phone within reach ?Nurse Communication: Mobility status ?PT Visit Diagnosis: Unsteadiness on  feet (R26.81);Other abnormalities of gait and mobility (R26.89);Muscle weakness (generalized) (M62.81) ?  ? ? ?Time: 3244-0102 ?PT Time Calculation (min) (ACUTE ONLY): 20 min ? ?Charges:  $Therapeutic Exercise: 8-22 mins ?$Therapeutic Activity: 8-22 mins          ?          ? ?3:10 PM, 05/14/21 ?Lonell Grandchild, MPT ?Physical Therapist with [facility] ?[facility] ?[phone] office ?[phone] mobile phone ? ? ?

## 2021-05-14 NOTE — Care Management Important Message (Signed)
Important Message ? ?Patient Details  ?Name: William Flores ?MRN: [id number] ?Date of Birth: [birth year] ? ? ?Medicare Important Message Given:  Yes ? ? ? ? ?[name] ?05/14/2021, 12:20 PM ?

## 2021-05-15 DIAGNOSIS — Z515 Encounter for palliative care: Secondary | ICD-10-CM | POA: Diagnosis not present

## 2021-05-15 DIAGNOSIS — Z66 Do not resuscitate: Secondary | ICD-10-CM | POA: Diagnosis not present

## 2021-05-15 DIAGNOSIS — I959 Hypotension, unspecified: Secondary | ICD-10-CM | POA: Diagnosis not present

## 2021-05-15 DIAGNOSIS — Z7189 Other specified counseling: Secondary | ICD-10-CM | POA: Diagnosis not present

## 2021-05-15 LAB — GLUCOSE, CAPILLARY
Glucose-Capillary: 154 mg/dL — ABNORMAL HIGH (ref 70–99)
Glucose-Capillary: 171 mg/dL — ABNORMAL HIGH (ref 70–99)
Glucose-Capillary: 133 mg/dL — ABNORMAL HIGH (ref 70–99)
Glucose-Capillary: 141 mg/dL — ABNORMAL HIGH (ref 70–99)

## 2021-05-15 LAB — CBC
HCT: 36.6 % — ABNORMAL LOW (ref 39.0–52.0)
Hemoglobin: 11.1 g/dL — ABNORMAL LOW (ref 13.0–17.0)
MCH: 31.3 pg (ref 26.0–34.0)
MCHC: 30.3 g/dL (ref 30.0–36.0)
MCV: 103.1 fL — ABNORMAL HIGH (ref 80.0–100.0)
Platelets: 71 K/uL — ABNORMAL LOW (ref 150–400)
RBC: 3.55 MIL/uL — ABNORMAL LOW (ref 4.22–5.81)
RDW: 25.6 % — ABNORMAL HIGH (ref 11.5–15.5)
WBC: 5.7 K/uL (ref 4.0–10.5)
nRBC: 0.5 % — ABNORMAL HIGH (ref 0.0–0.2)

## 2021-05-15 LAB — RENAL FUNCTION PANEL
Albumin: 3.3 g/dL — ABNORMAL LOW (ref 3.5–5.0)
Anion gap: 10 (ref 5–15)
BUN: 51 mg/dL — ABNORMAL HIGH (ref 8–23)
CO2: 25 mmol/L (ref 22–32)
Calcium: 8.3 mg/dL — ABNORMAL LOW (ref 8.9–10.3)
Chloride: 97 mmol/L — ABNORMAL LOW (ref 98–111)
Creatinine, Ser: 6.85 mg/dL — ABNORMAL HIGH (ref 0.61–1.24)
GFR, Estimated: 8 mL/min — ABNORMAL LOW (ref >60)
Glucose, Bld: 161 mg/dL — ABNORMAL HIGH (ref 70–99)
Phosphorus: 6.6 mg/dL — ABNORMAL HIGH (ref 2.5–4.6)
Potassium: 4.2 mmol/L (ref 3.5–5.1)
Sodium: 132 mmol/L — ABNORMAL LOW (ref 135–145)

## 2021-05-15 LAB — BLOOD GAS, ARTERIAL
Acid-Base Excess: 3 mmol/L — ABNORMAL HIGH (ref 0.0–2.0)
Bicarbonate: 30.1 mmol/L — ABNORMAL HIGH (ref 20.0–28.0)
Drawn by: 213381
O2 Saturation: 97.8 %
Patient temperature: 36.2
pCO2 arterial: 55 mmHg — ABNORMAL HIGH (ref 32–48)
pH, Arterial: 7.34 — ABNORMAL LOW (ref 7.35–7.45)
pO2, Arterial: 87 mmHg (ref 83–108)

## 2021-05-15 NOTE — Procedures (Addendum)
Patient arrived to dialysis unit lethargic and difficult to arouse, eyes closed. Responds to voice by eventually opening eyes but no vocal response as of yet, only mouthing silently with extensive prompting by this nurse. Follows commands slowly with lethargy. Blood pressure sustained throughout hemodialysis. Removed 3.6 Liters of fluid. Ending BP 120/77 pulse 79. Patient sent back to room at 1845. Dr. Johnney Ou, nephrologist, and Dr. Dyann Kief, hospitalist, notified. ?

## 2021-05-15 NOTE — Progress Notes (Signed)
?PROGRESS NOTE ? ? ? ?ZAKRY CASO  HDQ:[id number] DOB: 10-24-1950 DOA: 05/04/2021 ?PCP: [name], [name], MD  ? ?  ?Brief Narrative:  ?William Flores is a 71 year old male with multiple medical comorbidities including ESRD on HD TTS, right-sided heart failure, hypotension, hypothyroidism, Nash-liver cirrhosis, chronic anemia, cytopenia, hypokalemia, DM II, A-fib on Eliquis, obesity who presents from [facility] with complaint of nausea, vomiting, and generalized weakness that started yesterday.  Due to his symptoms, patient did not undergo dialysis as scheduled on 5/2.  In the emergency department, patient met sepsis criteria, concerning for multifocal pneumonia.  Patient was started on IV antibiotics and admitted to the hospital.  Nephrology, cardiology, pulmonology, palliative care consulted. ? ?Hospitalization has been complicated by respiratory failure, requiring high flow oxygen.  Episode of hematemesis and blood loss anemia requiring blood transfusion.  Altered mental status.  Hypotension requiring Levophed. ? ?New events last 24 hours / Subjective: ?Patient is fatigued but alert and conversational.  Underwent dialysis last night without issue.  Remains on Levophed today.  Discussed with him his critical illness, poor prognosis, hospice.  When asked about how he would feel about stopping dialysis and transitioning to hospice he tells me "I don't know" ? ?I had a conversation over the phone with step-daughter, [name], who I have been updating daily.  Patient remains with very poor prognosis overall.  Remains on Levophed, has been on Levophed for 4 days, unable to wean.  At this point, it is unlikely that patient would be able to tolerate outpatient dialysis.  Recommended to her to discuss end-of-life, comfort care, hospice with patient today.  Palliative care, nephrology, cardiology services will be back on Monday.  Can see if they have any further recommendations, but I suspect that recommendation would  be to transition to hospice.  His prognosis is grave, likely days.  ? ?05/11/21: Somnolent/sleepy but able to follow simple commands.  Currently off pressors and with at times later today for hemodialysis.  Follow clinical response.  Overall condition continues to be poor with very guarded outcome at this point.  Continue to follow assistance by nephrology service and palliative care. ? ?5/10: Well-tolerated hemodialysis almost 3 L removed.  Currently reporting no chest pain and feeling better.  Still requiring 2 L nasal cannula supplementation and having signs of fluid overload on exam.  Next dialysis treatment anticipated for 05/13/2021.  We will continue following 1 day at a time.  Appreciate assistance and recommendation by nephrology service and palliative care service. ? ?5/11: Well-tolerated hemodialysis; 3.3 L removed.  No chest pain, no nausea, no vomiting, no abdominal pain.  No overnight events. ? ?5/12: In good spirit.  2 L nasal cannula supplementation in place.  Reports no nausea, no vomiting, no fever.  Hoping for another dialysis treatment without the need of any pressor support.  If that the case will coordinate for discharge back to skilled nursing facility on 05/16/2021.  TOC has been made aware. ? ?5/13: Very sleepy/somnolent; will assess for dialysis tolerance today as planned.  Continue supportive care.  Given increased somnolence will check ABG. ? ?Assessment & Plan: ?Principal Problem: ?  Septic shock ([place]) ?Active Problems: ?  ESRD (end stage renal disease) on dialysis [facility]) ?  Hypotension ?  Elevated troponin ?  Chronic right heart failure (HCC) ?  OSA (obstructive sleep apnea) ?  Atrial fibrillation ([place]) ?  Chronic respiratory failure with hypoxia (HCC) ?  Gout ?  History of colon cancer ?  DM (  diabetes mellitus), type 2 with renal complications ([place]) ?  Mixed hyperlipidemia ?  Iron deficiency anemia ?  Liver cirrhosis secondary to [facility]) ?  Morbid obesity ([place]) ?  Acute respiratory  failure with hypoxia ([place]) ?  Nonischemic cardiomyopathy ([place]) ?  Pulmonary hypertension, unspecified ([place]) ?  GERD (gastroesophageal reflux disease) ?  Nausea and vomiting ?  Pain of upper abdomen ?  Constipation ?  Ischemic hepatitis ?  Hematemesis ? ? ?Septic shock secondary to multifocal pneumonia ?-He does have chronic hypotension on midodrine (specially around hemodialysis treatment). ?-MRSA PCR negative ?-Patient has completed IV antibiotics while inpatient. ?-Continue midodrine on the use of Solu-Cortef. ?-Currently off Levophed; blood pressure marginal with a systolic blood pressure in the 90s.  Will assess patient tolerance of hemodialysis without pressors support. ?-Condition is critical, overall poor prognosis and guarded outcome. ?-Outpatient follow-up with palliative care. ? ?Acute on chronic hypoxemic respiratory failure ?-Chronically using 2 L nasal cannula O2 at baseline ?-Insetting of multifocal pneumonia, missed dialysis sessions and vascular congestion/interstitial edema due to right-sided heart failure. ?-CXR showing worsening findings of CHF or fluid overload, increased moderate pleural effusions, persistent left basilar consolidation with bilateral increased opacities in the right-greater-than-left consistent with edema, ?pneumonia or combination ?-Continue volume management per hemodialysis as tolerated ?-Patient has completed a total of 7 days of antibiotics and is currently afebrile. ?-Continue supportive care and wean off oxygen supplementation as tolerated. ?-Patient is afebrile and is stable. ? ?Hematemesis, acute blood loss anemia ?-Transfused 2 unit packed red blood cells 5/3  ?-Continue PPI ?-Continue to hold Eliquis/IV heparin ?-Appreciate assistance/recommendations by GI service.    ?-No plans for endoscopy currently.   ? ?ESRD on HD TTS ?-Nephrology following ?-HD 5/4, 5/6; first dialysis without the need of pressors on 05/11/2021 and 05/13/21 almost 6.3 L removed and  well-tolerated. ?-Will continue to closely follow his tolerance and recommendations by nephrology service. ?-Next anticipated treatment 05/15/2021. ?-Okay to continue midodrine and Solu-Cortef. ? ?Acute metabolic encephalopathy ?-Oriented x3; somnolent and very sleepy. ?-Following commands appropriately and just severely weak/deconditioned. ?-Continue constant reorientation and supportive care ?-Continue to follow response. ? ?Chronic A-fib ?-Eliquis/IV heparin on hold due to hematemesis ?-Continue holding metoprolol in the setting of hypotension. ?-Rate controlled and stable currently. ?-Continue telemetry monitoring. ? ?Chronic diastolic heart failure/RV failure  ?NICM ?-Continue volume management with hemodialysis; tolerated almost 3.3 L removal with dialysis on 05/13/2021; next treatment 05/15/2021. ?-Patient has not required pressor supports. ?-Continue to closely follow clinical response and stability. ?-If patient tolerates dialysis on 05/15/2021, will plan to discharge back to skilled nursing facility on 05/16/2021. ?-Cardiology has signed off at this point at this no other treatment that they can offer him. ?-Continue to follow daily weights/strict I's and O's. ? ?Pulmonary hypertension ?-Continue the use of sildenafil. ? ?NASH cirrhosis with elevated LFTs ?-Continue rifaximin and lactulose. ?-Mentation is normal and is stable.. ?-Will follow recommendations by GI; overall very poor prognosis ?-Concerned that his elevated LFTs are secondary to hepatic congestion. ?-Continue to follow LFTs trend. ?-Continue volume management per hemodialysis. ? ?Nausea, vomiting, abdominal pain ?-CT abdomen pelvis revealed possible acute pancreatitis, lipase 19 ?-Patient reports no nausea or vomiting currently; also denies abdominal pain.  Tolerating diet. ?-Continue supportive care and follow clinical response. ?-Continue as needed antiemetics. ? ?Diabetes mellitus type 2 with renal complications ?-L8V 4.3 ?-Continue  SSI ?-Continue to follow CBG fluctuation. ?-Very likely elevated in the setting of Solu-Cortef usage. ? ?Demand ischemia ?-In setting of hypotension, ESRD ?-Echocardiogram showed  EF 60 to 65%, no regional wall motion abnormality, RV

## 2021-05-16 DIAGNOSIS — E782 Mixed hyperlipidemia: Secondary | ICD-10-CM

## 2021-05-16 DIAGNOSIS — J9601 Acute respiratory failure with hypoxia: Secondary | ICD-10-CM | POA: Diagnosis not present

## 2021-05-16 DIAGNOSIS — A419 Sepsis, unspecified organism: Secondary | ICD-10-CM | POA: Diagnosis not present

## 2021-05-16 DIAGNOSIS — K219 Gastro-esophageal reflux disease without esophagitis: Secondary | ICD-10-CM

## 2021-05-16 DIAGNOSIS — I959 Hypotension, unspecified: Secondary | ICD-10-CM | POA: Diagnosis not present

## 2021-05-16 DIAGNOSIS — I4891 Unspecified atrial fibrillation: Secondary | ICD-10-CM | POA: Diagnosis not present

## 2021-05-16 LAB — GLUCOSE, CAPILLARY
Glucose-Capillary: 189 mg/dL — ABNORMAL HIGH (ref 70–99)
Glucose-Capillary: 192 mg/dL — ABNORMAL HIGH (ref 70–99)
Glucose-Capillary: 218 mg/dL — ABNORMAL HIGH (ref 70–99)
Glucose-Capillary: 213 mg/dL — ABNORMAL HIGH (ref 70–99)

## 2021-05-16 MED ORDER — RIFAXIMIN 550 MG PO TABS: 550.0000 mg | ORAL_TABLET | Freq: Two times a day (BID) | ORAL | 1 refills | Status: DC

## 2021-05-16 MED ORDER — TRAMADOL HCL 50 MG PO TABS: 50.0000 mg | ORAL_TABLET | Freq: Three times a day (TID) | ORAL | 0 refills | Status: DC | PRN

## 2021-05-16 MED ORDER — PANTOPRAZOLE SODIUM 40 MG PO TBEC: 40.0000 mg | DELAYED_RELEASE_TABLET | Freq: Two times a day (BID) | ORAL | 1 refills | Status: DC

## 2021-05-16 MED ORDER — HYDROCORTISONE 10 MG PO TABS: 10.0000 mg | ORAL_TABLET | Freq: Two times a day (BID) | ORAL | 2 refills | Status: DC

## 2021-05-16 NOTE — Progress Notes (Signed)
RNCM spoke with Melissa with [facility]. Patient can be accepted today, room# 210B call report to [phone]. LPN has been notified. ? ?No additional TOC needs  ?

## 2021-05-16 NOTE — Discharge Summary (Signed)
?Physician Discharge Summary ?  ?Patient: William Flores MRN: [id number] DOB: December 04, 1950  ?Admit date:     05/04/2021  ?Discharge date: 05/16/21  ?Discharge Physician: Barton Dubois  ? ?PCP: [name], [name], MD  ? ?Recommendations at discharge:  ?Repeat CBC to follow hemoglobin trend and instability ?Patient to continue outpatient dialysis ?Continue to closely follow patient response to vital signs and adjust antihypertensive on dexamethasone regimen as required. ?Continue volume management with dialysis service. ? ? ?Discharge Diagnoses: ?Principal Problem: ?  Septic shock ([place]) ?Active Problems: ?  ESRD (end stage renal disease) on dialysis [facility]) ?  Hypotension ?  Elevated troponin ?  Chronic right heart failure (HCC) ?  OSA (obstructive sleep apnea) ?  Atrial fibrillation ([place]) ?  Chronic respiratory failure with hypoxia (HCC) ?  Gout ?  History of colon cancer ?  DM (diabetes mellitus), type 2 with renal complications ([place]) ?  Mixed hyperlipidemia ?  Iron deficiency anemia ?  Liver cirrhosis secondary to NAS[facility]) ?  Morbid obesity ([place]) ?  Acute respiratory failure with hypoxia ([place]) ?  Nonischemic cardiomyopathy ([place]) ?  Pulmonary hypertension, unspecified ([place]) ?  GERD (gastroesophageal reflux disease) ?  Nausea and vomiting ?  Pain of upper abdomen ?  Constipation ?  Ischemic hepatitis ?  Hematemesis ? ?Resolved Problems: ?  Chronic renal failure ? ?Hospital Course: ?William Flores is a 71 year old male with multiple medical comorbidities including ESRD on HD TTS, right-sided heart failure, hypotension, hypothyroidism, Nash-liver cirrhosis, chronic anemia, cytopenia, hypokalemia, DM II, A-fib on Eliquis, obesity who presents from [facility] with complaint of nausea, vomiting, and generalized weakness that started yesterday.  Due to his symptoms, patient did not undergo dialysis as scheduled on 5/2.  In the emergency department, patient met sepsis criteria, concerning for multifocal pneumonia.   Patient was started on IV antibiotics and admitted to the hospital.  Nephrology, cardiology, pulmonology, palliative care consulted. ?  ?Hospitalization has been complicated by respiratory failure, requiring high flow oxygen.  Episode of hematemesis and blood loss anemia requiring blood transfusion.  Altered mental status.  Hypotension requiring Levophed. ? ?Assessment and Plan: ?Septic shock secondary to multifocal pneumonia ?-He does have chronic hypotension on midodrine (specially around hemodialysis treatment). ?-MRSA PCR negative ?-Patient has completed IV antibiotics while inpatient. ?-Continue midodrine on the use of Solu-Cortef. ?-Currently off Levophed; blood pressure marginal with a systolic blood pressure in the 90s.  Will assess patient tolerance of hemodialysis without pressors support. ?-Condition is critical, overall poor prognosis and guarded outcome. ?-Outpatient follow-up with palliative care. ?  ?Acute on chronic hypoxemic respiratory failure ?-Chronically using 2 L nasal cannula O2 at baseline ?-Insetting of multifocal pneumonia, missed dialysis sessions and vascular congestion/interstitial edema due to right-sided heart failure. ?-CXR showing worsening findings of CHF or fluid overload, increased moderate pleural effusions, persistent left basilar consolidation with bilateral increased opacities in the right-greater-than-left consistent with edema, ?pneumonia or combination ?-Continue volume management per hemodialysis as tolerated ?-Patient has completed a total of 7 days of antibiotics and is currently afebrile. ?-Continue supportive care and wean off oxygen supplementation as tolerated. ?-Patient is afebrile and is stable. ?  ?Hematemesis, acute blood loss anemia ?-Transfused 2 unit packed red blood cells 5/3  ?-Continue PPI ?-Continue to hold Eliquis/IV heparin ?-Appreciate assistance/recommendations by GI service.    ?-No plans for endoscopy currently.   ?  ?ESRD on HD TTS ?-Nephrology  following ?-HD 5/4, 5/6; first dialysis without the need of pressors on 05/11/2021 and 05/13/21  almost 6.3 L removed and well-tolerated. ?-Will continue to closely follow his tolerance and recommendations by nephrology service. ?-Next anticipated treatment 05/15/2021. ?-Okay to continue midodrine and Solu-Cortef. ?  ?Acute metabolic encephalopathy ?-Oriented x3; somnolent and very sleepy. ?-Following commands appropriately and just severely weak/deconditioned. ?-Continue constant reorientation and supportive care ?-Continue to follow response. ? ?Chronic A-fib ?-Eliquis/IV heparin on hold due to hematemesis ?-Continue holding metoprolol in the setting of hypotension. ?-Rate controlled and stable currently. ?-Continue telemetry monitoring. ?  ?Chronic diastolic heart failure/RV failure  ?NICM ?-Continue volume management with hemodialysis; tolerated almost 3.3 L removal with dialysis on 05/13/2021; next treatment 05/15/2021. ?-Patient has not required pressor supports. ?-Continue to closely follow clinical response and stability. ?-If patient tolerates dialysis on 05/15/2021, will plan to discharge back to skilled nursing facility on 05/16/2021. ?-Cardiology has signed off at this point at this no other treatment that they can offer him. ?-Continue to follow daily weights/strict I's and O's. ?  ?Pulmonary hypertension ?-Continue the use of sildenafil. ? ?NASH cirrhosis with elevated LFTs ?-Continue rifaximin and lactulose. ?-Mentation is normal and is stable.. ?-Will follow recommendations by GI; overall very poor prognosis ?-Concerned that his elevated LFTs are secondary to hepatic congestion. ?-Continue to follow LFTs trend. ?-Continue volume management per hemodialysis. ?  ?Nausea, vomiting, abdominal pain ?-CT abdomen pelvis revealed possible acute pancreatitis, lipase 19 ?-Patient reports no nausea or vomiting currently; also denies abdominal pain.  Tolerating diet. ?-Continue supportive care and follow clinical  response. ?-Continue as needed antiemetics. ?  ?Diabetes mellitus type 2 with renal complications ?-C3J 4.3 ?-Continue SSI ?-Continue to follow CBG fluctuation. ?-Very likely elevated in the setting of Solu-Cortef usage. ?  ?Demand ischemia ?-In setting of hypotension, ESRD ?-Echocardiogram showed EF 60 to 65%, no regional wall motion abnormality, RV pressure overload with reduced RV systolic function; going along with severe pulmonary hypertension and right heart failure. ?-Cardiology service recommending transition to hospice and comfort care if unable to tolerate dialysis without pressors. ?-Patient has remained off pressors support and has tolerated x2 dialysis with requiring only albumin infusion. ?-Reports no chest pain. ?  ?Hypothyroidism ?-Continue the use of Synthroid. ?-Patient encouraged to take medication on empty stomach in the morning. ?  ?HLD ?-Continue the use of Lipitor ?-Heart healthy diet discussed with patient. ? ?Morbid obesity ?-Low calorie diet and portion control discussed with patient. ?-Body mass index is 41.47 kg/m?. ? ? ?Consultants: PCCM, nephrology, palliative, GI service, cardiology. ?Procedures performed: See below for x-ray reports. ?Disposition: Discharge back to skilled nursing facility for further care and rehabilitation. ?Diet recommendation: Heart healthy/modified carbohydrate diet. ? ?DISCHARGE MEDICATION: ?Allergies as of 05/16/2021   ? ?   Reactions  ? Codeine   ? Headache  ? Diclofenac   ? Ibuprofen   ? Indomethacin   ? Meloxicam   ? Naproxen   ? ?  ? ?  ?Medication List  ?  ? ?STOP taking these medications   ? ?apixaban 5 MG Tabs tablet ?Commonly known as: ELIQUIS ?  ?metoprolol succinate 25 MG 24 hr tablet ?Commonly known as: TOPROL-XL ?  ?potassium chloride SA 20 MEQ tablet ?Commonly known as: KLOR-CON M ?  ? ?  ? ?TAKE these medications   ? ?acetaminophen 160 MG/5ML liquid ?Commonly known as: TYLENOL ?Take 650 mg by mouth every 4 (four) hours as needed for fever. ?   ?allopurinol 100 MG tablet ?Commonly known as: ZYLOPRIM ?Take 200 mg by mouth daily. ?  ?atorvastatin 20 MG tablet ?Commonly known as:  LIPITOR ?TAKE 1 TABLET BY MOUTH EVERY DAY ?What changed:  ?how much to take ?how to tak

## 2021-05-16 NOTE — Progress Notes (Signed)
Nsg Discharge Note ? ?Admit Date:  05/04/2021 ?Discharge date: 05/16/2021 ?  ?William Flores to be D/C'd Home per MD order.  AVS completed.  Marland Kitchen ?Patient/caregiver able to verbalize understanding. ? ?Discharge Medication: ?Allergies as of 05/16/2021   ? ?   Reactions  ? Codeine   ? Headache  ? Diclofenac   ? Ibuprofen   ? Indomethacin   ? Meloxicam   ? Naproxen   ? ?  ? ?  ?Medication List  ?  ? ?STOP taking these medications   ? ?apixaban 5 MG Tabs tablet ?Commonly known as: ELIQUIS ?  ?metoprolol succinate 25 MG 24 hr tablet ?Commonly known as: TOPROL-XL ?  ?potassium chloride SA 20 MEQ tablet ?Commonly known as: KLOR-CON M ?  ? ?  ? ?TAKE these medications   ? ?acetaminophen 160 MG/5ML liquid ?Commonly known as: TYLENOL ?Take 650 mg by mouth every 4 (four) hours as needed for fever. ?  ?allopurinol 100 MG tablet ?Commonly known as: ZYLOPRIM ?Take 200 mg by mouth daily. ?  ?atorvastatin 20 MG tablet ?Commonly known as: LIPITOR ?TAKE 1 TABLET BY MOUTH EVERY DAY ?What changed:  ?how much to take ?how to take this ?when to take this ?additional instructions ?  ?B-complex with vitamin C tablet ?Take 1 tablet by mouth daily. ?  ?Contour Next Test test strip ?Generic drug: glucose blood ?Test BS TID Dx E11.29 ?  ?Enulose 10 GM/15ML Soln ?Generic drug: lactulose (encephalopathy) ?Take 20 g by mouth daily. ?  ?ferrous sulfate 325 (65 FE) MG tablet ?Take 325 mg by mouth daily with breakfast. ?  ?guaiFENesin 100 MG/5ML liquid ?Commonly known as: ROBITUSSIN ?Take 5 mLs by mouth every 4 (four) hours as needed for cough or to loosen phlegm. ?  ?hydrocortisone 10 MG tablet ?Commonly known as: CORTEF ?Take 1 tablet (10 mg total) by mouth 2 (two) times daily. ?  ?insulin aspart 100 UNIT/ML injection ?Commonly known as: novoLOG ?Before each meal 3 times a day, 140-199 - 2 units, 200-250 - 4 units, 251-299 - 6 units,  300-349 - 8 units,  350 or above 10 units. ?Insulin PEN if approved, provide syringes and needles if needed. ?  ?insulin  detemir 100 UNIT/ML injection ?Commonly known as: LEVEMIR ?Inject 0.08 mLs (8 Units total) into the skin at bedtime. ?  ?Insulin Pen Needle 32G X 4 MM Misc ?Use to give insulin daily Dx E11.29 ?  ?levothyroxine 100 MCG tablet ?Commonly known as: SYNTHROID ?Take 100 mcg by mouth every morning. ?  ?melatonin 5 MG Tabs ?Take 5 mg by mouth at bedtime as needed (insomnia). ?  ?midodrine 10 MG tablet ?Commonly known as: PROAMATINE ?Take 1 tablet (10 mg total) by mouth 3 (three) times daily with meals. ?  ?pantoprazole 40 MG tablet ?Commonly known as: PROTONIX ?Take 1 tablet (40 mg total) by mouth 2 (two) times daily. ?What changed: when to take this ?  ?polyethylene glycol 17 g packet ?Commonly known as: MIRALAX / GLYCOLAX ?Take 17 g by mouth daily. ?  ?Pro-Stat Liqd ?Take 30 mLs by mouth daily. ?  ?rifaximin 550 MG Tabs tablet ?Commonly known as: XIFAXAN ?Take 1 tablet (550 mg total) by mouth 2 (two) times daily. ?  ?sildenafil 20 MG tablet ?Commonly known as: REVATIO ?Take 1 tablet (20 mg total) by mouth 3 (three) times daily. ?  ?traMADol 50 MG tablet ?Commonly known as: Ultram ?Take 1 tablet (50 mg total) by mouth every 8 (eight) hours as needed for severe pain. ?  What changed:  ?when to take this ?reasons to take this ?  ?Vitamin D 125 MCG (5000 UT) Caps ?Take 5,000 Units by mouth daily. ?  ? ?  ? ? ?Discharge Assessment: ?Vitals:  ? 05/16/21 0526 05/16/21 1410  ?BP: 106/69 121/74  ?Pulse: 84 88  ?Resp: 16 16  ?Temp: 97.6 ?F (36.4 ?C) 97.6 ?F (36.4 ?C)  ?SpO2: 98% 94%  ? Skin clean, dry and intact without evidence of skin break down, no evidence of skin tears noted. ?IV catheter discontinued intact. Site without signs and symptoms of complications - no redness or edema noted at insertion site, patient denies c/o pain - only slight tenderness at site.  Dressing with slight pressure applied. ? ?D/c Instructions-Education: ?Discharge instructions given to patient/family with verbalized understanding. ?D/c education  completed with patient/family including follow up instructions, medication list, d/c activities limitations if indicated, with other d/c instructions as indicated by MD - patient able to verbalize understanding, all questions fully answered. ?Patient instructed to return to ED, call 911, or call MD for any changes in condition.  ?Patient escorted via [place], and D/C home via private auto. ? ?Kathie Rhodes, RN ?05/16/2021 3:35 PM  ?

## 2021-05-17 DIAGNOSIS — N186 End stage renal disease: Secondary | ICD-10-CM | POA: Diagnosis not present

## 2021-05-17 DIAGNOSIS — D509 Iron deficiency anemia, unspecified: Secondary | ICD-10-CM | POA: Diagnosis not present

## 2021-05-17 DIAGNOSIS — I959 Hypotension, unspecified: Secondary | ICD-10-CM | POA: Diagnosis not present

## 2021-05-17 DIAGNOSIS — K219 Gastro-esophageal reflux disease without esophagitis: Secondary | ICD-10-CM | POA: Diagnosis not present

## 2021-05-17 DIAGNOSIS — A419 Sepsis, unspecified organism: Secondary | ICD-10-CM | POA: Diagnosis not present

## 2021-05-17 DIAGNOSIS — R531 Weakness: Secondary | ICD-10-CM | POA: Diagnosis not present

## 2021-05-17 DIAGNOSIS — J189 Pneumonia, unspecified organism: Secondary | ICD-10-CM | POA: Diagnosis not present

## 2021-05-17 DIAGNOSIS — R6521 Severe sepsis with septic shock: Secondary | ICD-10-CM | POA: Diagnosis not present

## 2021-05-17 DIAGNOSIS — D638 Anemia in other chronic diseases classified elsewhere: Secondary | ICD-10-CM | POA: Diagnosis not present

## 2021-05-17 DIAGNOSIS — R369 Urethral discharge, unspecified: Secondary | ICD-10-CM | POA: Diagnosis not present

## 2021-05-17 DIAGNOSIS — R112 Nausea with vomiting, unspecified: Secondary | ICD-10-CM | POA: Diagnosis not present

## 2021-05-17 DIAGNOSIS — N390 Urinary tract infection, site not specified: Secondary | ICD-10-CM | POA: Diagnosis not present

## 2021-05-17 DIAGNOSIS — M1000 Idiopathic gout, unspecified site: Secondary | ICD-10-CM | POA: Diagnosis not present

## 2021-05-17 DIAGNOSIS — E039 Hypothyroidism, unspecified: Secondary | ICD-10-CM | POA: Diagnosis not present

## 2021-05-17 DIAGNOSIS — I272 Pulmonary hypertension, unspecified: Secondary | ICD-10-CM | POA: Diagnosis not present

## 2021-05-17 DIAGNOSIS — J9611 Chronic respiratory failure with hypoxia: Secondary | ICD-10-CM | POA: Diagnosis not present

## 2021-05-17 DIAGNOSIS — E782 Mixed hyperlipidemia: Secondary | ICD-10-CM | POA: Diagnosis not present

## 2021-05-17 DIAGNOSIS — I5033 Acute on chronic diastolic (congestive) heart failure: Secondary | ICD-10-CM | POA: Diagnosis not present

## 2021-05-17 DIAGNOSIS — E1122 Type 2 diabetes mellitus with diabetic chronic kidney disease: Secondary | ICD-10-CM | POA: Diagnosis not present

## 2021-05-17 DIAGNOSIS — D631 Anemia in chronic kidney disease: Secondary | ICD-10-CM | POA: Diagnosis not present

## 2021-05-17 DIAGNOSIS — R399 Unspecified symptoms and signs involving the genitourinary system: Secondary | ICD-10-CM | POA: Diagnosis not present

## 2021-05-17 DIAGNOSIS — I10 Essential (primary) hypertension: Secondary | ICD-10-CM | POA: Diagnosis not present

## 2021-05-17 DIAGNOSIS — Z992 Dependence on renal dialysis: Secondary | ICD-10-CM | POA: Diagnosis not present

## 2021-05-17 DIAGNOSIS — Z79899 Other long term (current) drug therapy: Secondary | ICD-10-CM | POA: Diagnosis not present

## 2021-05-17 DIAGNOSIS — I2510 Atherosclerotic heart disease of native coronary artery without angina pectoris: Secondary | ICD-10-CM | POA: Diagnosis not present

## 2021-05-18 DIAGNOSIS — D509 Iron deficiency anemia, unspecified: Secondary | ICD-10-CM | POA: Diagnosis not present

## 2021-05-18 DIAGNOSIS — D631 Anemia in chronic kidney disease: Secondary | ICD-10-CM | POA: Diagnosis not present

## 2021-05-18 DIAGNOSIS — N186 End stage renal disease: Secondary | ICD-10-CM | POA: Diagnosis not present

## 2021-05-18 DIAGNOSIS — Z992 Dependence on renal dialysis: Secondary | ICD-10-CM | POA: Diagnosis not present

## 2021-05-19 DIAGNOSIS — Z992 Dependence on renal dialysis: Secondary | ICD-10-CM | POA: Diagnosis not present

## 2021-05-19 DIAGNOSIS — N186 End stage renal disease: Secondary | ICD-10-CM | POA: Diagnosis not present

## 2021-05-19 DIAGNOSIS — I5033 Acute on chronic diastolic (congestive) heart failure: Secondary | ICD-10-CM | POA: Diagnosis not present

## 2021-05-19 DIAGNOSIS — I272 Pulmonary hypertension, unspecified: Secondary | ICD-10-CM | POA: Diagnosis not present

## 2021-05-20 DIAGNOSIS — D631 Anemia in chronic kidney disease: Secondary | ICD-10-CM | POA: Diagnosis not present

## 2021-05-20 DIAGNOSIS — Z992 Dependence on renal dialysis: Secondary | ICD-10-CM | POA: Diagnosis not present

## 2021-05-20 DIAGNOSIS — N186 End stage renal disease: Secondary | ICD-10-CM | POA: Diagnosis not present

## 2021-05-20 DIAGNOSIS — D509 Iron deficiency anemia, unspecified: Secondary | ICD-10-CM | POA: Diagnosis not present

## 2021-05-24 DIAGNOSIS — Z79899 Other long term (current) drug therapy: Secondary | ICD-10-CM | POA: Diagnosis not present

## 2021-05-24 DIAGNOSIS — R399 Unspecified symptoms and signs involving the genitourinary system: Secondary | ICD-10-CM | POA: Diagnosis not present

## 2021-05-24 DIAGNOSIS — Z992 Dependence on renal dialysis: Secondary | ICD-10-CM | POA: Diagnosis not present

## 2021-05-24 DIAGNOSIS — N186 End stage renal disease: Secondary | ICD-10-CM | POA: Diagnosis not present

## 2021-05-24 DIAGNOSIS — D509 Iron deficiency anemia, unspecified: Secondary | ICD-10-CM | POA: Diagnosis not present

## 2021-05-24 DIAGNOSIS — R369 Urethral discharge, unspecified: Secondary | ICD-10-CM | POA: Diagnosis not present

## 2021-05-24 DIAGNOSIS — D631 Anemia in chronic kidney disease: Secondary | ICD-10-CM | POA: Diagnosis not present

## 2021-05-26 ENCOUNTER — Telehealth: Payer: Self-pay

## 2021-05-26 ENCOUNTER — Encounter: Payer: Self-pay | Admitting: Urology

## 2021-05-26 ENCOUNTER — Ambulatory Visit (INDEPENDENT_AMBULATORY_CARE_PROVIDER_SITE_OTHER): Payer: Medicare Other | Admitting: Urology

## 2021-05-26 VITALS — BP 105/72 | HR 94 | Ht 64.0 in | Wt 241.6 lb

## 2021-05-26 DIAGNOSIS — D649 Anemia, unspecified: Secondary | ICD-10-CM | POA: Diagnosis not present

## 2021-05-26 DIAGNOSIS — I10 Essential (primary) hypertension: Secondary | ICD-10-CM | POA: Diagnosis not present

## 2021-05-26 DIAGNOSIS — R310 Gross hematuria: Secondary | ICD-10-CM | POA: Diagnosis not present

## 2021-05-26 DIAGNOSIS — Z992 Dependence on renal dialysis: Secondary | ICD-10-CM | POA: Diagnosis not present

## 2021-05-26 DIAGNOSIS — N186 End stage renal disease: Secondary | ICD-10-CM | POA: Diagnosis not present

## 2021-05-26 DIAGNOSIS — Z87440 Personal history of urinary (tract) infections: Secondary | ICD-10-CM

## 2021-05-26 DIAGNOSIS — K219 Gastro-esophageal reflux disease without esophagitis: Secondary | ICD-10-CM | POA: Diagnosis not present

## 2021-05-26 DIAGNOSIS — I50810 Right heart failure, unspecified: Secondary | ICD-10-CM | POA: Diagnosis not present

## 2021-05-26 DIAGNOSIS — E1169 Type 2 diabetes mellitus with other specified complication: Secondary | ICD-10-CM | POA: Diagnosis not present

## 2021-05-26 DIAGNOSIS — K7581 Nonalcoholic steatohepatitis (NASH): Secondary | ICD-10-CM | POA: Diagnosis not present

## 2021-05-26 DIAGNOSIS — D696 Thrombocytopenia, unspecified: Secondary | ICD-10-CM | POA: Diagnosis not present

## 2021-05-26 DIAGNOSIS — I272 Pulmonary hypertension, unspecified: Secondary | ICD-10-CM | POA: Diagnosis not present

## 2021-05-26 NOTE — Telephone Encounter (Signed)
Belinda from [facility] called to let Dr. Felipa Eth know they can no longer transport Naval architect ( against the law).  They can transport Industrial/product designer (simular to [employer])  If any questions call Belinda at [phone]  Thanks, Helene Kelp

## 2021-05-26 NOTE — Progress Notes (Signed)
2   Assessment: 1. Gross hematuria   2. History of UTI   3. ESRD (end stage renal disease) on dialysis [facility])     Plan: His episodes of hematuria appear to be related to his UTIs.   Please obtain a urinalysis and fax these results to [phone] Return to office in 3-4 weeks for cystoscopy Please transport patient in [place] chair while for procedure to be performed in the office.  Chief Complaint:  Chief Complaint  Patient presents with   Hematuria    History of Present Illness:  William Flores is a 71 y.o. year old male who is seen for further evaluation of gross hematuria, history of UTI, and balanitis.  The patient has multiple medical problems and is currently on hemodialysis for end-stage renal disease.  He had an episode of gross hematuria in January 2023.  This was associated with dysuria.  No flank or abdominal pain.  He was told he had a UTI and treated with antibiotics.  The gross hematuria subsequently resolved.  He was not having any dysuria or other urinary symptoms at his visit on 02/02/21.  He voids approximately twice per day while on hemodialysis.  No history of UTIs or kidney stones.    He does have a remote history of tobacco use but has not smoked for over 30 years. CT imaging from 10/22 showed no renal or ureteral calculi and no evidence of obstruction. IPSS = 4. He was found to have balanitis on physical examination.  He was treated with nystatin cream.  At his visit in March 2023, he reported no further episodes of gross hematuria.  He reported that a urine sample was obtained at his nursing facility with some evidence of a UTI and he was treated with some antibiotics.  He reported some occasional dysuria.  His balanitis had improved.  Urine culture results: 02/09/2021 E. Coli  -treated with Macrobid x7 days 01/06/2021 E. coli, Proteus, Enterococcus -treated with fosfomycin 3/23  Proteus, E. Coli.  He has been admitted to the hospital several times since his last  visit.  His most recent hospitalization was in May 2023 for sepsis secondary to pneumonia.  Urinalysis at that time showed >50 WBCs, 0-5 RBCs, and many bacteria.  No culture was performed. Imaging from 05/05/2021 showed no renal calculi or hydronephrosis, anterior bladder wall thickening with inflammatory fat stranding.  He presents today for scheduled follow-up. He reports discomfort with urination and abdominal pain.  No flank pain.  He is not sure if he has had recent gross hematuria.  He is not currently on antibiotics.  He continues on dialysis and voids 1-2 times/day.   Portions of the above documentation were copied from a prior visit for review purposes only.   Past Medical History:  Past Medical History:  Diagnosis Date   Anemia    CAD (coronary artery disease)    LHC 2/08:  mRCA 50%, o/w no sig CAD, EF 25%   Cardiomyopathy ([place])    EF 35-40% in 2/16 in setting of AF with RVR >> echo 5/16 with improved LVF with EF 65-70%   CHF (congestive heart failure) (HCC)    Chronic atrial fibrillation (HCC)    Chronic diastolic heart failure (HCC)    HFPF - EF ~65-70% (OFTEN EXACERBATED BY AFIB)   Cirrhosis of liver (HCC)    CKD (chronic kidney disease)    hx of worsening renal failure and hyperK+ in setting of acute diast CHF >> required CVVHD in 4/16  and dialysis x 1 in 5/16   CKD stage 3 due to type 2 diabetes mellitus ([place]) 09/17/2015   Colon cancer ([place])    Colon polyps 09/21/2012   Tubular adenoma   Diabetes (HCC)    GERD (gastroesophageal reflux disease)    Hyperlipidemia    Hypertension    Hypothyroidism    Insomnia    Nonalcoholic steatohepatitis    Obesity hypoventilation syndrome ([place])    Pulmonary hypertension ([place])    Renal insufficiency    Sleep apnea    Thrombocytopenia ([place])     Past Surgical History:  Past Surgical History:  Procedure Laterality Date   AV FISTULA PLACEMENT Right 03/02/2021   Procedure: RIGHT ARM ARTERIOVENOUS (AV) FISTULA CREATION;  Surgeon:  [name], MD;  Location: [facility];  Service: Vascular;  Laterality: Right;   CIRCUMCISION     COLON RESECTION     COLONOSCOPY N/A 09/21/2012   Procedure: COLONOSCOPY;  Surgeon: Inda Castle, MD;  Location: [facility] ENDOS[facility];  Service: Endoscopy;  Laterality: N/A;   COLONOSCOPY N/A 12/28/2018   Procedure: COLONOSCOPY;  Surgeon: Rogene Houston, MD;  Location: [facility];  Service: Endoscopy;  Laterality: N/A;   COLONOSCOPY WITH PROPOFOL N/A 10/17/2020   Procedure: COLONOSCOPY WITH PROPOFOL;  Surgeon: Harvel Quale, MD;  Location: [facility];  Service: Gastroenterology;  Laterality: N/A;   COLONOSCOPY WITH PROPOFOL N/A 04/25/2021   Procedure: COLONOSCOPY WITH PROPOFOL;  Surgeon: Rogene Houston, MD;  Location: [facility];  Service: Endoscopy;  Laterality: N/A;   ENTEROSCOPY N/A 06/24/2020   Procedure: ENTEROSCOPY;  Surgeon: Gatha Mayer, MD;  Location: [facility] ENDOSCOPY;  Service: Endoscopy;  Laterality: N/A;   ESOPHAGOGASTRODUODENOSCOPY N/A 12/28/2018   Procedure: ESOPHAGOGASTRODUODENOSCOPY (EGD);  Surgeon: Rogene Houston, MD;  [facility];  Service: Endoscopy;  Laterality: N/A;   HOT HEMOSTASIS  04/25/2021   Procedure: HOT HEMOSTASIS (ARGON PLASMA COAGULATION/BICAP);  Surgeon: Rogene Houston, MD;  [facility];  Service: Endoscopy;;   IR FLUORO GUIDE CV LINE RIGHT  12/10/2020   IR US GUIDE VASC ACCESS RIGHT  12/11/2020   POLYPECTOMY  10/17/2020   Procedure: POLYPECTOMY;  Surgeon: Harvel Quale, MD;  Location: [facility];  Service: Gastroenterology;;  rectal, transverse, descending, ascending, sigmoid   RIGHT HEART CATH N/A 04/11/2019   Procedure: RIGHT HEART CATH;  Surgeon: Larey Dresser, MD;  Location: [place] CV LAB;  Service: Cardiovascular;  Laterality: N/A;   US ECHOCARDIOGRAPHY  03/04/2010   abnormal study    Allergies:  Allergies  Allergen Reactions   Codeine     Headache    Diclofenac    Ibuprofen    Indomethacin     Meloxicam    Naproxen     Family History:  Family History  Problem Relation Age of Onset   Breast cancer Mother    Diabetes Mother    Heart attack Father     Social History:  Social History   Tobacco Use   Smoking status: Former    Types: Cigarettes    Quit date: 08/06/1983    Years since quitting: 37.8   Smokeless tobacco: Never  Vaping Use   Vaping Use: Never used  Substance Use Topics   Alcohol use: No   Drug use: No    ROS: Constitutional:  Negative for fever, chills, weight loss CV: Negative for chest pain, previous MI, hypertension Respiratory:  Negative for shortness of breath, wheezing, sleep apnea, frequent cough GI:  Negative for nausea, vomiting, bloody stool, GERD  Physical exam: BP 105/72   Pulse 94   Ht 5' 4"  (1.626 m)   Wt 241 lb 9.9 oz (109.6 kg)   BMI 41.47 kg/m  GENERAL APPEARANCE:  Chronically ill appearing, male NAD HEENT:  Atraumatic, normocephalic, oropharynx clear NECK:  Supple without lymphadenopathy or thyromegaly ABDOMEN:  Soft, non-tender, no masses EXTREMITIES:  Without clubbing, cyanosis, or edema NEUROLOGIC:  Alert and oriented x 3, in wheelchair, CN II-XII grossly intact MENTAL STATUS:  appropriate BACK:  Non-tender to palpation, No CVAT SKIN:  Warm, dry, and intact  Results: No sample provided

## 2021-05-26 NOTE — Telephone Encounter (Signed)
Please see below.

## 2021-05-26 NOTE — Telephone Encounter (Signed)
Yes the [facility] chairs recline

## 2021-05-27 ENCOUNTER — Encounter (HOSPITAL_COMMUNITY): Payer: Self-pay

## 2021-05-27 ENCOUNTER — Other Ambulatory Visit: Payer: Self-pay

## 2021-05-27 ENCOUNTER — Emergency Department (HOSPITAL_COMMUNITY)

## 2021-05-27 ENCOUNTER — Emergency Department (HOSPITAL_COMMUNITY)
Admission: EM | Admit: 2021-05-27 | Discharge: 2021-05-27 | Disposition: A | Discharge status: Skilled Nursing Facility | Attending: Emergency Medicine | Admitting: Emergency Medicine

## 2021-05-27 DIAGNOSIS — T68XXXA Hypothermia, initial encounter: Secondary | ICD-10-CM | POA: Diagnosis not present

## 2021-05-27 DIAGNOSIS — Z85038 Personal history of other malignant neoplasm of large intestine: Secondary | ICD-10-CM | POA: Insufficient documentation

## 2021-05-27 DIAGNOSIS — Z79899 Other long term (current) drug therapy: Secondary | ICD-10-CM | POA: Diagnosis not present

## 2021-05-27 DIAGNOSIS — N186 End stage renal disease: Secondary | ICD-10-CM | POA: Insufficient documentation

## 2021-05-27 DIAGNOSIS — Z992 Dependence on renal dialysis: Secondary | ICD-10-CM | POA: Diagnosis not present

## 2021-05-27 DIAGNOSIS — J9811 Atelectasis: Secondary | ICD-10-CM | POA: Diagnosis not present

## 2021-05-27 DIAGNOSIS — R279 Unspecified lack of coordination: Secondary | ICD-10-CM | POA: Diagnosis not present

## 2021-05-27 DIAGNOSIS — E119 Type 2 diabetes mellitus without complications: Secondary | ICD-10-CM | POA: Diagnosis not present

## 2021-05-27 DIAGNOSIS — E162 Hypoglycemia, unspecified: Secondary | ICD-10-CM | POA: Diagnosis not present

## 2021-05-27 DIAGNOSIS — Z794 Long term (current) use of insulin: Secondary | ICD-10-CM | POA: Diagnosis not present

## 2021-05-27 DIAGNOSIS — J90 Pleural effusion, not elsewhere classified: Secondary | ICD-10-CM | POA: Diagnosis not present

## 2021-05-27 DIAGNOSIS — I120 Hypertensive chronic kidney disease with stage 5 chronic kidney disease or end stage renal disease: Secondary | ICD-10-CM | POA: Diagnosis not present

## 2021-05-27 DIAGNOSIS — I132 Hypertensive heart and chronic kidney disease with heart failure and with stage 5 chronic kidney disease, or end stage renal disease: Secondary | ICD-10-CM | POA: Diagnosis not present

## 2021-05-27 DIAGNOSIS — Z7401 Bed confinement status: Secondary | ICD-10-CM | POA: Diagnosis not present

## 2021-05-27 DIAGNOSIS — R531 Weakness: Secondary | ICD-10-CM | POA: Diagnosis not present

## 2021-05-27 DIAGNOSIS — I5032 Chronic diastolic (congestive) heart failure: Secondary | ICD-10-CM | POA: Diagnosis not present

## 2021-05-27 DIAGNOSIS — I959 Hypotension, unspecified: Secondary | ICD-10-CM | POA: Diagnosis not present

## 2021-05-27 LAB — BASIC METABOLIC PANEL
Anion gap: 9 (ref 5–15)
BUN: 68 mg/dL — ABNORMAL HIGH (ref 8–23)
CO2: 26 mmol/L (ref 22–32)
Calcium: 8.5 mg/dL — ABNORMAL LOW (ref 8.9–10.3)
Chloride: 101 mmol/L (ref 98–111)
Creatinine, Ser: 8.66 mg/dL — ABNORMAL HIGH (ref 0.61–1.24)
GFR, Estimated: 6 mL/min — ABNORMAL LOW (ref >60)
Glucose, Bld: 88 mg/dL (ref 70–99)
Potassium: 4.6 mmol/L (ref 3.5–5.1)
Sodium: 136 mmol/L (ref 135–145)

## 2021-05-27 LAB — CBC WITH DIFFERENTIAL/PLATELET
Abs Immature Granulocytes: 0.01 10*3/uL (ref 0.00–0.07)
Basophils Absolute: 0 10*3/uL (ref 0.0–0.1)
Basophils Relative: 1 %
Eosinophils Absolute: 0.1 10*3/uL (ref 0.0–0.5)
Eosinophils Relative: 2 %
HCT: 38.5 % — ABNORMAL LOW (ref 39.0–52.0)
Hemoglobin: 11.3 g/dL — ABNORMAL LOW (ref 13.0–17.0)
Immature Granulocytes: 0 %
Lymphocytes Relative: 10 %
Lymphs Abs: 0.4 10*3/uL — ABNORMAL LOW (ref 0.7–4.0)
MCH: 30.8 pg (ref 26.0–34.0)
MCHC: 29.4 g/dL — ABNORMAL LOW (ref 30.0–36.0)
MCV: 104.9 fL — ABNORMAL HIGH (ref 80.0–100.0)
Monocytes Absolute: 0.6 10*3/uL (ref 0.1–1.0)
Monocytes Relative: 16 %
Neutro Abs: 2.8 10*3/uL (ref 1.7–7.7)
Neutrophils Relative %: 71 %
Platelets: 42 10*3/uL — ABNORMAL LOW (ref 150–400)
RBC: 3.67 MIL/uL — ABNORMAL LOW (ref 4.22–5.81)
RDW: 24.4 % — ABNORMAL HIGH (ref 11.5–15.5)
WBC: 3.9 10*3/uL — ABNORMAL LOW (ref 4.0–10.5)
nRBC: 0 % (ref 0.0–0.2)

## 2021-05-27 NOTE — ED Triage Notes (Signed)
Patient brought in by rescue from [facility] for missing 3 dialysis appointment.

## 2021-05-27 NOTE — ED Provider Notes (Signed)
[facility] EMERGENCY DEPARTMENT Provider Note   CSN: [id number] Arrival date & time: 05/27/21  1459     History  Chief Complaint  Patient presents with   Missed Dialysis     KYPTON ELTRINGHAM is a 71 y.o. male.  Patient has been receiving hemodialysis for about 8 months maybe longer.  Patient has a catheter in his right anterior chest.  Sent in from [facility] after missing dialysis.  Patient states he did not go on Tuesday or today.  May have missed last Saturday normally goes Tuesday Thursday Saturdays.  Patient without any complaints.  Stated that he missed them because of diarrhea but that has now resolved.  Not sure why he did not go today.  Patient without any specific complaint oxygen sats are 92 to 95%.  Does have a dialysis catheter in the anterior part of the chest.  Patient is essentially bedridden.  Past medical history significant for colon cancer diabetes hypertension chronic atrial fibrillation chronic diastolic heart failure cardiomyopathy end-stage renal disease history of thrombocytopenia.      Home Medications Prior to Admission medications   Medication Sig Start Date End Date Taking? Authorizing Provider  acetaminophen (TYLENOL) 160 MG/5ML liquid Take 650 mg by mouth every 4 (four) hours as needed for fever.    [provider]  allopurinol (ZYLOPRIM) 100 MG tablet Take 200 mg by mouth daily.    [provider]  Amino Acids-Protein Hydrolys (PRO-STAT) LIQD Take 30 mLs by mouth daily.    [provider]  atorvastatin (LIPITOR) 20 MG tablet TAKE 1 TABLET BY MOUTH EVERY DAY Patient taking differently: Take 20 mg by mouth at bedtime. 03/19/20   [name], [name], MD  B Complex-C (B-COMPLEX WITH VITAMIN C) tablet Take 1 tablet by mouth daily. 12/12/20   Florencia Reasons, MD  Cholecalciferol (VITAMIN D) 125 MCG (5000 UT) CAPS Take 5,000 Units by mouth daily.    [provider]  ENULOSE 10 GM/15ML SOLN Take 20 g by mouth daily. 04/13/21    [provider]  ferrous sulfate 325 (65 FE) MG tablet Take 325 mg by mouth daily with breakfast.    [provider]  glucose blood (CONTOUR NEXT TEST) test strip Test BS TID Dx E11.29 05/22/20   [name], [name], MD  guaiFENesin (ROBITUSSIN) 100 MG/5ML liquid Take 5 mLs by mouth every 4 (four) hours as needed for cough or to loosen phlegm.    [provider]  hydrocortisone (CORTEF) 10 MG tablet Take 1 tablet (10 mg total) by mouth 2 (two) times daily. 05/16/21   Barton Dubois, MD  insulin aspart (NOVOLOG) 100 UNIT/ML injection Before each meal 3 times a day, 140-199 - 2 units, 200-250 - 4 units, 251-299 - 6 units,  300-349 - 8 units,  350 or above 10 units. Insulin PEN if approved, provide syringes and needles if needed. 12/11/20   Florencia Reasons, MD  insulin detemir (LEVEMIR) 100 UNIT/ML injection Inject 0.08 mLs (8 Units total) into the skin at bedtime. 04/27/21   Roxan Hockey, MD  Insulin Pen Needle 32G X 4 MM MISC Use to give insulin daily Dx E11.29 04/15/19   Charlynne Cousins, MD  levothyroxine (SYNTHROID) 100 MCG tablet Take 100 mcg by mouth every morning.    [provider]  melatonin 5 MG TABS Take 5 mg by mouth at bedtime as needed (insomnia).    [provider]  midodrine (PROAMATINE) 10 MG tablet Take 1 tablet (10 mg total) by  mouth 3 (three) times daily with meals. 04/26/21   Roxan Hockey, MD  pantoprazole (PROTONIX) 40 MG tablet Take 1 tablet (40 mg total) by mouth 2 (two) times daily. 05/16/21   Barton Dubois, MD  polyethylene glycol (MIRALAX / GLYCOLAX) 17 g packet Take 17 g by mouth daily. 01/01/19   Roxan Hockey, MD  rifaximin (XIFAXAN) 550 MG TABS tablet Take 1 tablet (550 mg total) by mouth 2 (two) times daily. 05/16/21   Barton Dubois, MD  sildenafil (REVATIO) 20 MG tablet Take 1 tablet (20 mg total) by mouth 3 (three) times daily. 08/24/20   Larey Dresser, MD  traMADol (ULTRAM) 50 MG tablet Take 1 tablet (50 mg total) by  mouth every 8 (eight) hours as needed for severe pain. 05/16/21   Barton Dubois, MD  diltiazem [facility]) 240 MG 24 hr capsule TAKE 1 CAPSULE DAILY 10/19/12 02/17/13  Vernie Shanks, MD      Allergies    Codeine, Diclofenac, Ibuprofen, Indomethacin, Meloxicam, and Naproxen    Review of Systems   Review of Systems  Constitutional:  Negative for chills and fever.  HENT:  Negative for ear pain and sore throat.   Eyes:  Negative for pain and visual disturbance.  Respiratory:  Negative for cough and shortness of breath.   Cardiovascular:  Negative for chest pain and palpitations.  Gastrointestinal:  Positive for diarrhea. Negative for abdominal pain and vomiting.  Genitourinary:  Negative for dysuria and hematuria.  Musculoskeletal:  Negative for arthralgias and back pain.  Skin:  Negative for color change and rash.  Neurological:  Negative for seizures and syncope.  All other systems reviewed and are negative.  Physical Exam Updated Vital Signs BP 105/69   Pulse (!) 107   Temp 98.5 F (36.9 C) (Oral)   Resp 20   Ht 1.626 m (5' 4" )   Wt 108.9 kg   SpO2 94%   BMI 41.20 kg/m  Physical Exam Vitals and nursing note reviewed.  Constitutional:      General: He is not in acute distress.    Appearance: Normal appearance. He is well-developed.  HENT:     Head: Normocephalic and atraumatic.  Eyes:     Extraocular Movements: Extraocular movements intact.     Conjunctiva/sclera: Conjunctivae normal.     Pupils: Pupils are equal, round, and reactive to light.  Cardiovascular:     Rate and Rhythm: Normal rate and regular rhythm.     Heart sounds: No murmur heard. Pulmonary:     Effort: Pulmonary effort is normal. No respiratory distress.     Breath sounds: Normal breath sounds. No wheezing, rhonchi or rales.     Comments: Dialysis catheter right anterior chest Abdominal:     Palpations: Abdomen is soft.     Tenderness: There is no abdominal tenderness.  Musculoskeletal:         General: No swelling.     Cervical back: Normal range of motion and neck supple.     Right lower leg: Edema present.     Left lower leg: Edema present.     Comments: Just trace edema to both lower extremities.  No chronic skin changes  Skin:    General: Skin is warm and dry.     Capillary Refill: Capillary refill takes less than 2 seconds.  Neurological:     General: No focal deficit present.     Mental Status: He is alert and oriented to person, place, and time. Mental status is at baseline.  Psychiatric:        Mood and Affect: Mood normal.    ED Results / Procedures / Treatments   Labs (all labs ordered are listed, but only abnormal results are displayed) Labs Reviewed  CBC WITH DIFFERENTIAL/PLATELET - Abnormal; Notable for the following components:      Result Value   WBC 3.9 (*)    RBC 3.67 (*)    Hemoglobin 11.3 (*)    HCT 38.5 (*)    MCV 104.9 (*)    MCHC 29.4 (*)    RDW 24.4 (*)    Platelets 42 (*)    Lymphs Abs 0.4 (*)    All other components within normal limits  BASIC METABOLIC PANEL - Abnormal; Notable for the following components:   BUN 68 (*)    Creatinine, Ser 8.66 (*)    Calcium 8.5 (*)    GFR, Estimated 6 (*)    All other components within normal limits    EKG None  Radiology DG Chest Port 1 View  Result Date: 05/27/2021 CLINICAL DATA:  Missed dialysis EXAM: PORTABLE CHEST 1 VIEW COMPARISON:  05/06/2021 FINDINGS: Improvement in vascular congestion and edema since the prior study. Mild vascular congestion remains. Small right effusion has improved. Cardiac enlargement. Left lower lobe atelectasis has improved. No dialysis catheter in the right atrium unchanged IMPRESSION: Improvement in bilateral airspace disease compatible with clearing edema. Improvement in right pleural effusion. Mild left lower lobe atelectasis also improved. Electronically Signed   By: Franchot Gallo M.D.   On: 05/27/2021 15:52    Procedures Procedures    Medications Ordered in  ED Medications - No data to display  ED Course/ Medical Decision Making/ A&P                           Medical Decision Making Amount and/or Complexity of Data Reviewed Labs: ordered. Radiology: ordered.   Patient without any emergent indications for dialysis today.  Chest x-ray does not show any significant volume overload.  Patient's platelet counts are 42,000 but has a known history of thrombocytopenia.  No leukocytosis hemoglobin 11.3.  Patient's electrolytes other than the chronic renal failure patient's potassium is 4.6.  Discussed with Dr. Candiss Norse on-call for nephrology and patient cleared for discharge back to [facility] he wants [place] to arrange dialysis tomorrow and if not definitely needs to get it on Saturday.  No indications for dialysis today according to nephrology.   Final Clinical Impression(s) / ED Diagnoses Final diagnoses:  ESRD on hemodialysis [facility])    Rx / DC Orders ED Discharge Orders     None         Fredia Sorrow, MD 05/27/21 1819

## 2021-05-27 NOTE — ED Notes (Signed)
Attempted to call report to [facility], no answer.

## 2021-05-27 NOTE — Discharge Instructions (Signed)
Discussed with Dr. Candiss Norse on-call for nephrology.  Once [facility] to call tomorrow morning to see if they can work him in in the afternoon for dialysis otherwise patient will definitely need dialysis on Saturday.  No indication for acute dialysis today.  No fluid on the lungs potassium is 4.6.

## 2021-05-27 NOTE — ED Notes (Signed)
Attempted report to facility x 2.  Called [phone], no answer.

## 2021-05-28 DIAGNOSIS — D631 Anemia in chronic kidney disease: Secondary | ICD-10-CM | POA: Diagnosis not present

## 2021-05-28 DIAGNOSIS — N186 End stage renal disease: Secondary | ICD-10-CM | POA: Diagnosis not present

## 2021-05-28 DIAGNOSIS — D509 Iron deficiency anemia, unspecified: Secondary | ICD-10-CM | POA: Diagnosis not present

## 2021-05-28 DIAGNOSIS — Z992 Dependence on renal dialysis: Secondary | ICD-10-CM | POA: Diagnosis not present

## 2021-05-28 DIAGNOSIS — N2581 Secondary hyperparathyroidism of renal origin: Secondary | ICD-10-CM | POA: Diagnosis not present

## 2021-05-29 DIAGNOSIS — D649 Anemia, unspecified: Secondary | ICD-10-CM | POA: Diagnosis not present

## 2021-05-29 DIAGNOSIS — K219 Gastro-esophageal reflux disease without esophagitis: Secondary | ICD-10-CM | POA: Diagnosis not present

## 2021-05-29 DIAGNOSIS — K7581 Nonalcoholic steatohepatitis (NASH): Secondary | ICD-10-CM | POA: Diagnosis not present

## 2021-05-29 DIAGNOSIS — D696 Thrombocytopenia, unspecified: Secondary | ICD-10-CM | POA: Diagnosis not present

## 2021-05-29 DIAGNOSIS — I50810 Right heart failure, unspecified: Secondary | ICD-10-CM | POA: Diagnosis not present

## 2021-05-29 DIAGNOSIS — N186 End stage renal disease: Secondary | ICD-10-CM | POA: Diagnosis not present

## 2021-06-01 DIAGNOSIS — D696 Thrombocytopenia, unspecified: Secondary | ICD-10-CM | POA: Diagnosis not present

## 2021-06-01 DIAGNOSIS — Z992 Dependence on renal dialysis: Secondary | ICD-10-CM | POA: Diagnosis not present

## 2021-06-01 DIAGNOSIS — N186 End stage renal disease: Secondary | ICD-10-CM | POA: Diagnosis not present

## 2021-06-01 DIAGNOSIS — I50810 Right heart failure, unspecified: Secondary | ICD-10-CM | POA: Diagnosis not present

## 2021-06-01 DIAGNOSIS — D509 Iron deficiency anemia, unspecified: Secondary | ICD-10-CM | POA: Diagnosis not present

## 2021-06-01 DIAGNOSIS — N2581 Secondary hyperparathyroidism of renal origin: Secondary | ICD-10-CM | POA: Diagnosis not present

## 2021-06-01 DIAGNOSIS — D649 Anemia, unspecified: Secondary | ICD-10-CM | POA: Diagnosis not present

## 2021-06-01 DIAGNOSIS — K219 Gastro-esophageal reflux disease without esophagitis: Secondary | ICD-10-CM | POA: Diagnosis not present

## 2021-06-01 DIAGNOSIS — K7581 Nonalcoholic steatohepatitis (NASH): Secondary | ICD-10-CM | POA: Diagnosis not present

## 2021-06-01 DIAGNOSIS — D631 Anemia in chronic kidney disease: Secondary | ICD-10-CM | POA: Diagnosis not present

## 2021-06-01 NOTE — Progress Notes (Deleted)
Cardiology Office Note   Date:  06/01/2021   ID:  William Flores, DOB [birth year], MRN [id number]  PCP:  [name], [name], MD  Cardiologist:   Minus Breeding, MD   No chief complaint on file.     History of Present Illness: William Flores is a 71 y.o. male who presents for evaluation of atrial fibrillation and previous cardiomyopathy. As previously seen in [place] but wants to switch his care here. He had respiratory failure in February with combined acute systolic and diastolic heart failure and anemia 5-40% probably related to A. fib with rapid ventricular rate. In 2016 his atrial fibrillation rate was controlled. He did have volume overload and acute renal insufficiency and required CVVHD. His EF however have improved to normal. He was again admitted in mid May with respiratory failure due to noncompliance CPAP. He required dialysis. He was diureses and lost 29 pounds. Meds were adjusted to reduce his calcium channel blocker because of bradycardia.  Since I last saw him he was in the hospital with sepsis.  I reviewed these records for this visit.   ***   Septic shock secondary to multifocal pneumonia -He does have chronic hypotension on midodrine (specially around hemodialysis treatment). -MRSA PCR negative -Patient has completed IV antibiotics while inpatient. -Continue midodrine on the use of Solu-Cortef. -Currently off Levophed; blood pressure marginal with a systolic blood pressure in the 90s.  Will assess patient tolerance of hemodialysis without pressors support. -Condition is critical, overall poor prognosis and guarded outcome. -Outpatient follow-up with palliative care.   Acute on chronic hypoxemic respiratory failure -Chronically using 2 L nasal cannula O2 at baseline -Insetting of multifocal pneumonia, missed dialysis sessions and vascular congestion/interstitial edema due to right-sided heart failure. -CXR showing worsening findings of CHF or fluid overload,  increased moderate pleural effusions, persistent left basilar consolidation with bilateral increased opacities in the right-greater-than-left consistent with edema, pneumonia or combination -Continue volume management per hemodialysis as tolerated -Patient has completed a total of 7 days of antibiotics and is currently afebrile. -Continue supportive care and wean off oxygen supplementation as tolerated. -Patient is afebrile and is stable.   Hematemesis, acute blood loss anemia -Transfused 2 unit packed red blood cells 5/3  -Continue PPI -Continue to hold Eliquis/IV heparin -Appreciate assistance/recommendations by GI service.    -No plans for endoscopy currently.     ESRD on HD TTS -Nephrology following -HD 5/4, 5/6; first dialysis without the need of pressors on 05/11/2021 and 05/13/21 almost 6.3 L removed and well-tolerated. -Will continue to closely follow his tolerance and recommendations by nephrology service. -Next anticipated treatment 05/15/2021. -Okay to continue midodrine and Solu-Cortef.   Acute metabolic encephalopathy -Oriented x3; somnolent and very sleepy. -Following commands appropriately and just severely weak/deconditioned. -Continue constant reorientation and supportive care -Continue to follow response.  Chronic A-fib -Eliquis/IV heparin on hold due to hematemesis -Continue holding metoprolol in the setting of hypotension. -Rate controlled and stable currently. -Continue telemetry monitoring.   Chronic diastolic heart failure/RV failure  NICM -Continue volume management with hemodialysis; tolerated almost 3.3 L removal with dialysis on 05/13/2021; next treatment 05/15/2021. -Patient has not required pressor supports. -Continue to closely follow clinical response and stability. -If patient tolerates dialysis on 05/15/2021, will plan to discharge back to skilled nursing facility on 05/16/2021. -Cardiology has signed off at this point at this no other treatment that  they can offer him. -Continue to follow daily weights/strict I's and O's.   Pulmonary hypertension -Continue the use  of sildenafil.  NASH cirrhosis with elevated LFTs -Continue rifaximin and lactulose. -Mentation is normal and is stable.. -Will follow recommendations by GI; overall very poor prognosis -Concerned that his elevated LFTs are secondary to hepatic congestion. -Continue to follow LFTs trend. -Continue volume management per hemodialysis.   Nausea, vomiting, abdominal pain -CT abdomen pelvis revealed possible acute pancreatitis, lipase 19 -Patient reports no nausea or vomiting currently; also denies abdominal pain.  Tolerating diet. -Continue supportive care and follow clinical response. -Continue as needed antiemetics.   Diabetes mellitus type 2 with renal complications -K7Q 4.3 -Continue SSI -Continue to follow CBG fluctuation. -Very likely elevated in the setting of Solu-Cortef usage.   Demand ischemia -In setting of hypotension, ESRD -Echocardiogram showed EF 60 to 65%, no regional wall motion abnormality, RV pressure overload with reduced RV systolic function; going along with severe pulmonary hypertension and right heart failure. -Cardiology service recommending transition to hospice and comfort care if unable to tolerate dialysis without pressors. -Patient has remained off pressors support and has tolerated x2 dialysis with requiring only albumin infusion. -Reports no chest pain.   Hypothyroidism -Continue the use of Synthroid. -Patient encouraged to take medication on empty stomach in the morning.   HLD -Continue the use of Lipitor -Heart healthy diet discussed with patient.   Morbid obesity -Low calorie diet and portion control discussed with patient. -Body mass index is 41.47 kg/m.      *** he has done well.  The patient denies any new symptoms such as chest discomfort, neck or arm discomfort. There has been no new shortness of breath, PND or  orthopnea. There have been no reported palpitations, presyncope or syncope.  He reports he is compliant with his medications and he is trying to watch his salt. He is weighing himself daily and his weights are staying about to 60 area  Past Medical History:  Diagnosis Date   Anemia    CAD (coronary artery disease)    LHC 2/08:  mRCA 50%, o/w no sig CAD, EF 25%   Cardiomyopathy ([place])    EF 35-40% in 2/16 in setting of AF with RVR >> echo 5/16 with improved LVF with EF 65-70%   CHF (congestive heart failure) (HCC)    Chronic atrial fibrillation (HCC)    Chronic diastolic heart failure (HCC)    HFPF - EF ~65-70% (OFTEN EXACERBATED BY AFIB)   Cirrhosis of liver (HCC)    CKD (chronic kidney disease)    hx of worsening renal failure and hyperK+ in setting of acute diast CHF >> required CVVHD in 4/16 and dialysis x 1 in 5/16   CKD stage 3 due to type 2 diabetes mellitus ([place]) 09/17/2015   Colon cancer ([place])    Colon polyps 09/21/2012   Tubular adenoma   Diabetes (HCC)    GERD (gastroesophageal reflux disease)    Hyperlipidemia    Hypertension    Hypothyroidism    Insomnia    Nonalcoholic steatohepatitis    Obesity hypoventilation syndrome ([place])    Pulmonary hypertension ([place])    Renal insufficiency    Sleep apnea    Thrombocytopenia ([place])     Past Surgical History:  Procedure Laterality Date   AV FISTULA PLACEMENT Right 03/02/2021   Procedure: RIGHT ARM ARTERIOVENOUS (AV) FISTULA CREATION;  Surgeon: [name], MD;  Location: [facility];  Service: Vascular;  Laterality: Right;   CIRCUMCISION     COLON RESECTION     COLONOSCOPY N/A 09/21/2012   Procedure: COLONOSCOPY;  Surgeon:  Inda Castle, MD;  Location: [facility];  Service: Endoscopy;  Laterality: N/A;   COLONOSCOPY N/A 12/28/2018   Procedure: COLONOSCOPY;  Surgeon: Rogene Houston, MD;  Location: [facility];  Service: Endoscopy;  Laterality: N/A;   COLONOSCOPY WITH PROPOFOL N/A 10/17/2020   Procedure: COLONOSCOPY WITH  PROPOFOL;  Surgeon: Harvel Quale, MD;  Location: [facility];  Service: Gastroenterology;  Laterality: N/A;   COLONOSCOPY WITH PROPOFOL N/A 04/25/2021   Procedure: COLONOSCOPY WITH PROPOFOL;  Surgeon: Rogene Houston, MD;  Location: [facility];  Service: Endoscopy;  Laterality: N/A;   ENTEROSCOPY N/A 06/24/2020   Procedure: ENTEROSCOPY;  Surgeon: Gatha Mayer, MD;  Location: [facility] ENDOSCOPY;  Service: Endoscopy;  Laterality: N/A;   ESOPHAGOGASTRODUODENOSCOPY N/A 12/28/2018   Procedure: ESOPHAGOGASTRODUODENOSCOPY (EGD);  Surgeon: Rogene Houston, MD;  [facility]: [facility];  Service: Endoscopy;  Laterality: N/A;   HOT HEMOSTASIS  04/25/2021   Procedure: HOT HEMOSTASIS (ARGON PLASMA COAGULATION/BICAP);  Surgeon: Rogene Houston, MD;  [facility];  Service: Endoscopy;;   IR FLUORO GUIDE CV LINE RIGHT  12/10/2020   IR US GUIDE VASC ACCESS RIGHT  12/11/2020   POLYPECTOMY  10/17/2020   Procedure: POLYPECTOMY;  Surgeon: Harvel Quale, MD;  Location: [facility];  Service: Gastroenterology;;  rectal, transverse, descending, ascending, sigmoid   RIGHT HEART CATH N/A 04/11/2019   Procedure: RIGHT HEART CATH;  Surgeon: Larey Dresser, MD;  Location: [place] [facility];  Service: Cardiovascular;  Laterality: N/A;   US ECHOCARDIOGRAPHY  03/04/2010   abnormal study     Current Outpatient Medications  Medication Sig Dispense Refill   acetaminophen (TYLENOL) 160 MG/5ML liquid Take 650 mg by mouth every 4 (four) hours as needed for fever.     allopurinol (ZYLOPRIM) 100 MG tablet Take 200 mg by mouth daily.     Amino Acids-Protein Hydrolys (PRO-STAT) LIQD Take 30 mLs by mouth daily.     atorvastatin (LIPITOR) 20 MG tablet TAKE 1 TABLET BY MOUTH EVERY DAY (Patient taking differently: Take 20 mg by mouth at bedtime.) 90 tablet 3   B Complex-C (B-COMPLEX WITH VITAMIN C) tablet Take 1 tablet by mouth daily.     Cholecalciferol (VITAMIN D) 125 MCG (5000 UT) CAPS Take  5,000 Units by mouth daily.     ENULOSE 10 GM/15ML SOLN Take 20 g by mouth daily.     ferrous sulfate 325 (65 FE) MG tablet Take 325 mg by mouth daily with breakfast.     glucose blood (CONTOUR NEXT TEST) test strip Test BS TID Dx E11.29 300 strip 3   guaiFENesin (ROBITUSSIN) 100 MG/5ML liquid Take 5 mLs by mouth every 4 (four) hours as needed for cough or to loosen phlegm.     hydrocortisone (CORTEF) 10 MG tablet Take 1 tablet (10 mg total) by mouth 2 (two) times daily. 60 tablet 2   insulin aspart (NOVOLOG) 100 UNIT/ML injection Before each meal 3 times a day, 140-199 - 2 units, 200-250 - 4 units, 251-299 - 6 units,  300-349 - 8 units,  350 or above 10 units. Insulin PEN if approved, provide syringes and needles if needed. 10 mL 0   insulin detemir (LEVEMIR) 100 UNIT/ML injection Inject 0.08 mLs (8 Units total) into the skin at bedtime. 10 mL 11   Insulin Pen Needle 32G X 4 MM MISC Use to give insulin daily Dx E11.29 100 each 3   levothyroxine (SYNTHROID) 100 MCG tablet Take 100 mcg by  mouth every morning.     melatonin 5 MG TABS Take 5 mg by mouth at bedtime as needed (insomnia).     midodrine (PROAMATINE) 10 MG tablet Take 1 tablet (10 mg total) by mouth 3 (three) times daily with meals. 90 tablet 3   pantoprazole (PROTONIX) 40 MG tablet Take 1 tablet (40 mg total) by mouth 2 (two) times daily. 60 tablet 1   polyethylene glycol (MIRALAX / GLYCOLAX) 17 g packet Take 17 g by mouth daily. 14 each 0   rifaximin (XIFAXAN) 550 MG TABS tablet Take 1 tablet (550 mg total) by mouth 2 (two) times daily. 60 tablet 1   sildenafil (REVATIO) 20 MG tablet Take 1 tablet (20 mg total) by mouth 3 (three) times daily. 90 tablet 11   traMADol (ULTRAM) 50 MG tablet Take 1 tablet (50 mg total) by mouth every 8 (eight) hours as needed for severe pain. 15 tablet 0   No current facility-administered medications for this visit.   Facility-Administered Medications Ordered in Other Visits  Medication Dose Route  Frequency Provider Last Rate Last Admin   0.9 %  sodium chloride infusion  100 mL Intravenous PRN Dwana Melena, MD       0.9 %  sodium chloride infusion  100 mL Intravenous PRN Dwana Melena, MD       alteplase (CATHFLO ACTIVASE) injection 2 mg  2 mg Intracatheter Once PRN Dwana Melena, MD       heparin injection 1,000 Units  1,000 Units Dialysis PRN Dwana Melena, MD       lidocaine (PF) (XYLOCAINE) 1 % injection 5 mL  5 mL Intradermal PRN Dwana Melena, MD       lidocaine-prilocaine (EMLA) cream 1 application.  1 application. Topical PRN Dwana Melena, MD       pentafluoroprop-tetrafluoroeth Land[place]ke) aerosol 1 application.  1 application. Topical PRN Dwana Melena, MD        Allergies:   Codeine, Diclofenac, Ibuprofen, Indomethacin, Meloxicam, and Naproxen    ROS:  Please see the history of present illness.   Otherwise, review of systems are positive for none.   All other systems are reviewed and negative.    PHYSICAL EXAM: VS:  There were no vitals taken for this visit. , BMI There is no height or weight on file to calculate BMI. GENERAL:  Well appearing HEENT:  Pupils equal round and reactive, fundi not visualized, oral mucosa unremarkable NECK:  No jugular venous distention, waveform within normal limits, carotid upstroke brisk and symmetric, no bruits, no thyromegaly LYMPHATICS:  No cervical, inguinal adenopathy LUNGS:  Clear to auscultation bilaterally BACK:  No CVA tenderness CHEST:  Unremarkable HEART:  PMI not displaced or sustained,S1 and S2 within normal limits, no C3,JS clicks, no rubs, no murmurs, irregular ABD:  Flat, positive bowel sounds normal in frequency in pitch, no bruits, no rebound, no guarding, no midline pulsatile mass, no hepatomegaly, no splenomegaly, umbilical hernia. EXT:  2 plus pulses throughout, trace lower extremity edema, no cyanosis no clubbing, chronic venous stasis changes SKIN:  No rashes no nodules NEURO:  Cranial nerves II through XII grossly intact,  motor grossly intact throughout PSYCH:  Cognitively intact, oriented to person place and time    EKG:  EKG is not ordered today.   Recent Labs: 05/04/2021: B Natriuretic Peptide 1,371.0 05/05/2021: Magnesium 2.3 05/14/2021: ALT 24 05/27/2021: BUN 68; Creatinine, Ser 8.66; Hemoglobin 11.3; Platelets 42; Potassium 4.6; Sodium 136    Lipid  Panel    Component Value Date/Time   CHOL 103 03/19/2020 1311   TRIG 140 03/19/2020 1311   TRIG 177 (H) 07/18/2013 1547   HDL 20 (L) 03/19/2020 1311   HDL 24 (L) 07/18/2013 1547   CHOLHDL 5.2 (H) 03/19/2020 1311   CHOLHDL 7.5 02/11/2014 0750   VLDL 23 02/11/2014 0750   LDLCALC 58 03/19/2020 1311   LDLCALC 67 07/18/2013 1547   LDLDIRECT 57 05/01/2017 1530      Wt Readings from Last 3 Encounters:  05/27/21 240 lb (108.9 kg)  05/26/21 241 lb 9.9 oz (109.6 kg)  05/16/21 241 lb 10 oz (109.6 kg)      Other studies Reviewed: Additional studies/ records that were reviewed today include: Echo results from April and May. Review of the above records demonstrates:  Please see elsewhere in the note.     ASSESSMENT AND PLAN:  Chronic diastolic HF/RV failure:  *** - 11/2020 echo: LVEF 55-60% no WMAs, indet diastolic function, D-septum, severe RV dysfunction and enlargement, severe pulm HTN, mild to mod MR,  05/04/21 echo: LVEF 06-23%, indet diastolic, Dseptum, severe RV dysfunction and enlargement, severe BAE - fluid is managed by HD-due tomorrow - levophed support as needed to support HD, if can't do dialysis tomorrow hospice will be recommended per hospitalist -Volume management per nephrology   Pulmonary HTN:  ***  - followed in HF clinic - combined group II and III pulmonary HTN. Diastolic HF, OSA/OHS.  04/2019 RHC: mean PA 53, PCWP 23, CI 2.71 PVR 5 WU - has been on sildenafil per CHF clinic but this is on hold due to hypotension   Hypotension:  ***  -some degree of chronic hypotension with soft blood pressure this morning102/56 mmHg -  previously on midodrine at home, from CHF Jan 2023 note plan to change from daily to just as needed for HD - on presentation 80/53, MAP 62. Sepsis workup per primary team. He is on emperic abx.  - midodrine restarted yesterday 10 mg 3 times daily -Remains on nor epi for BP support during dialysis   ESRD:  ***  -HD per nephrology, normally T,Th, Sat -Hemodialysis been limited due to hypotension from GI bleed -Palliative care is now involved and considering transition to comfort care.  The patient's been made DNR but wants to continue dialysis   Elevated LFTs:  ***  - perhaps related to hypotension vs congestion - history of Cirrhosis/NASH, though LFTs normal earlier this month   Pneumonia:  ***  - abx per primary team   Afib rate controlled:  ***  - anticoag on hold due to coffee ground emesis   Elevated troponin:  ***  - in setting of fluid overload, hypotension - at this time think ACS less likely   History of GI bleed:  ***  - admit earlier this month with GI bleed, anemia requiring transusion - patient with coffee ground emesis this admit, anticoag on hold - required 2 units pRBCs   Palliative care consultation:  *** ,  multiple advanced severe comorbidities with multiple acute severe medical conditions.      Current medicines are reviewed at length with the patient today.  The patient does not have concerns regarding medicines.  The following changes have been made:  ***  Labs/ tests ordered today include: *** No orders of the defined types were placed in this encounter.    Disposition:   FU with me in ***   Signed, Minus Breeding, MD  06/01/2021 8:20 PM  [facility]

## 2021-06-02 ENCOUNTER — Ambulatory Visit: Payer: Medicare Other | Admitting: Cardiology

## 2021-06-02 DIAGNOSIS — D696 Thrombocytopenia, unspecified: Secondary | ICD-10-CM | POA: Diagnosis not present

## 2021-06-02 DIAGNOSIS — I5033 Acute on chronic diastolic (congestive) heart failure: Secondary | ICD-10-CM

## 2021-06-02 DIAGNOSIS — K219 Gastro-esophageal reflux disease without esophagitis: Secondary | ICD-10-CM | POA: Diagnosis not present

## 2021-06-02 DIAGNOSIS — D649 Anemia, unspecified: Secondary | ICD-10-CM | POA: Diagnosis not present

## 2021-06-02 DIAGNOSIS — I272 Pulmonary hypertension, unspecified: Secondary | ICD-10-CM

## 2021-06-02 DIAGNOSIS — I50810 Right heart failure, unspecified: Secondary | ICD-10-CM | POA: Diagnosis not present

## 2021-06-02 DIAGNOSIS — N186 End stage renal disease: Secondary | ICD-10-CM | POA: Diagnosis not present

## 2021-06-02 DIAGNOSIS — R778 Other specified abnormalities of plasma proteins: Secondary | ICD-10-CM

## 2021-06-02 DIAGNOSIS — K7581 Nonalcoholic steatohepatitis (NASH): Secondary | ICD-10-CM | POA: Diagnosis not present

## 2021-06-03 DIAGNOSIS — K7581 Nonalcoholic steatohepatitis (NASH): Secondary | ICD-10-CM | POA: Diagnosis not present

## 2021-06-03 DIAGNOSIS — D631 Anemia in chronic kidney disease: Secondary | ICD-10-CM | POA: Diagnosis not present

## 2021-06-03 DIAGNOSIS — I10 Essential (primary) hypertension: Secondary | ICD-10-CM | POA: Diagnosis not present

## 2021-06-03 DIAGNOSIS — D509 Iron deficiency anemia, unspecified: Secondary | ICD-10-CM | POA: Diagnosis not present

## 2021-06-03 DIAGNOSIS — N186 End stage renal disease: Secondary | ICD-10-CM | POA: Diagnosis not present

## 2021-06-03 DIAGNOSIS — I50810 Right heart failure, unspecified: Secondary | ICD-10-CM | POA: Diagnosis not present

## 2021-06-03 DIAGNOSIS — I272 Pulmonary hypertension, unspecified: Secondary | ICD-10-CM | POA: Diagnosis not present

## 2021-06-03 DIAGNOSIS — N250 Renal osteodystrophy: Secondary | ICD-10-CM | POA: Diagnosis not present

## 2021-06-03 DIAGNOSIS — D696 Thrombocytopenia, unspecified: Secondary | ICD-10-CM | POA: Diagnosis not present

## 2021-06-03 DIAGNOSIS — Z992 Dependence on renal dialysis: Secondary | ICD-10-CM | POA: Diagnosis not present

## 2021-06-03 DIAGNOSIS — K219 Gastro-esophageal reflux disease without esophagitis: Secondary | ICD-10-CM | POA: Diagnosis not present

## 2021-06-03 DIAGNOSIS — E1169 Type 2 diabetes mellitus with other specified complication: Secondary | ICD-10-CM | POA: Diagnosis not present

## 2021-06-03 DIAGNOSIS — D649 Anemia, unspecified: Secondary | ICD-10-CM | POA: Diagnosis not present

## 2021-06-04 DIAGNOSIS — K219 Gastro-esophageal reflux disease without esophagitis: Secondary | ICD-10-CM | POA: Diagnosis not present

## 2021-06-04 DIAGNOSIS — D649 Anemia, unspecified: Secondary | ICD-10-CM | POA: Diagnosis not present

## 2021-06-04 DIAGNOSIS — K7581 Nonalcoholic steatohepatitis (NASH): Secondary | ICD-10-CM | POA: Diagnosis not present

## 2021-06-04 DIAGNOSIS — D696 Thrombocytopenia, unspecified: Secondary | ICD-10-CM | POA: Diagnosis not present

## 2021-06-04 DIAGNOSIS — N186 End stage renal disease: Secondary | ICD-10-CM | POA: Diagnosis not present

## 2021-06-04 DIAGNOSIS — I50810 Right heart failure, unspecified: Secondary | ICD-10-CM | POA: Diagnosis not present

## 2021-06-05 DIAGNOSIS — D509 Iron deficiency anemia, unspecified: Secondary | ICD-10-CM | POA: Diagnosis not present

## 2021-06-05 DIAGNOSIS — D631 Anemia in chronic kidney disease: Secondary | ICD-10-CM | POA: Diagnosis not present

## 2021-06-05 DIAGNOSIS — N186 End stage renal disease: Secondary | ICD-10-CM | POA: Diagnosis not present

## 2021-06-05 DIAGNOSIS — N250 Renal osteodystrophy: Secondary | ICD-10-CM | POA: Diagnosis not present

## 2021-06-05 DIAGNOSIS — Z992 Dependence on renal dialysis: Secondary | ICD-10-CM | POA: Diagnosis not present

## 2021-06-07 DIAGNOSIS — I50810 Right heart failure, unspecified: Secondary | ICD-10-CM | POA: Diagnosis not present

## 2021-06-07 DIAGNOSIS — D649 Anemia, unspecified: Secondary | ICD-10-CM | POA: Diagnosis not present

## 2021-06-07 DIAGNOSIS — K7581 Nonalcoholic steatohepatitis (NASH): Secondary | ICD-10-CM | POA: Diagnosis not present

## 2021-06-07 DIAGNOSIS — D696 Thrombocytopenia, unspecified: Secondary | ICD-10-CM | POA: Diagnosis not present

## 2021-06-07 DIAGNOSIS — N186 End stage renal disease: Secondary | ICD-10-CM | POA: Diagnosis not present

## 2021-06-07 DIAGNOSIS — K219 Gastro-esophageal reflux disease without esophagitis: Secondary | ICD-10-CM | POA: Diagnosis not present

## 2021-06-08 DIAGNOSIS — D509 Iron deficiency anemia, unspecified: Secondary | ICD-10-CM | POA: Diagnosis not present

## 2021-06-08 DIAGNOSIS — N250 Renal osteodystrophy: Secondary | ICD-10-CM | POA: Diagnosis not present

## 2021-06-08 DIAGNOSIS — D631 Anemia in chronic kidney disease: Secondary | ICD-10-CM | POA: Diagnosis not present

## 2021-06-08 DIAGNOSIS — N186 End stage renal disease: Secondary | ICD-10-CM | POA: Diagnosis not present

## 2021-06-08 DIAGNOSIS — Z992 Dependence on renal dialysis: Secondary | ICD-10-CM | POA: Diagnosis not present

## 2021-06-09 DIAGNOSIS — N186 End stage renal disease: Secondary | ICD-10-CM | POA: Diagnosis not present

## 2021-06-09 DIAGNOSIS — K7581 Nonalcoholic steatohepatitis (NASH): Secondary | ICD-10-CM | POA: Diagnosis not present

## 2021-06-09 DIAGNOSIS — D649 Anemia, unspecified: Secondary | ICD-10-CM | POA: Diagnosis not present

## 2021-06-09 DIAGNOSIS — I50810 Right heart failure, unspecified: Secondary | ICD-10-CM | POA: Diagnosis not present

## 2021-06-09 DIAGNOSIS — D696 Thrombocytopenia, unspecified: Secondary | ICD-10-CM | POA: Diagnosis not present

## 2021-06-09 DIAGNOSIS — K219 Gastro-esophageal reflux disease without esophagitis: Secondary | ICD-10-CM | POA: Diagnosis not present

## 2021-06-10 DIAGNOSIS — D509 Iron deficiency anemia, unspecified: Secondary | ICD-10-CM | POA: Diagnosis not present

## 2021-06-10 DIAGNOSIS — N186 End stage renal disease: Secondary | ICD-10-CM | POA: Diagnosis not present

## 2021-06-10 DIAGNOSIS — D631 Anemia in chronic kidney disease: Secondary | ICD-10-CM | POA: Diagnosis not present

## 2021-06-10 DIAGNOSIS — Z992 Dependence on renal dialysis: Secondary | ICD-10-CM | POA: Diagnosis not present

## 2021-06-10 DIAGNOSIS — N250 Renal osteodystrophy: Secondary | ICD-10-CM | POA: Diagnosis not present

## 2021-06-11 DIAGNOSIS — D649 Anemia, unspecified: Secondary | ICD-10-CM | POA: Diagnosis not present

## 2021-06-11 DIAGNOSIS — N186 End stage renal disease: Secondary | ICD-10-CM | POA: Diagnosis not present

## 2021-06-11 DIAGNOSIS — K7581 Nonalcoholic steatohepatitis (NASH): Secondary | ICD-10-CM | POA: Diagnosis not present

## 2021-06-11 DIAGNOSIS — K219 Gastro-esophageal reflux disease without esophagitis: Secondary | ICD-10-CM | POA: Diagnosis not present

## 2021-06-11 DIAGNOSIS — D696 Thrombocytopenia, unspecified: Secondary | ICD-10-CM | POA: Diagnosis not present

## 2021-06-11 DIAGNOSIS — I50810 Right heart failure, unspecified: Secondary | ICD-10-CM | POA: Diagnosis not present

## 2021-06-12 DIAGNOSIS — Z992 Dependence on renal dialysis: Secondary | ICD-10-CM | POA: Diagnosis not present

## 2021-06-12 DIAGNOSIS — D509 Iron deficiency anemia, unspecified: Secondary | ICD-10-CM | POA: Diagnosis not present

## 2021-06-12 DIAGNOSIS — N250 Renal osteodystrophy: Secondary | ICD-10-CM | POA: Diagnosis not present

## 2021-06-12 DIAGNOSIS — D631 Anemia in chronic kidney disease: Secondary | ICD-10-CM | POA: Diagnosis not present

## 2021-06-12 DIAGNOSIS — N186 End stage renal disease: Secondary | ICD-10-CM | POA: Diagnosis not present

## 2021-06-14 ENCOUNTER — Ambulatory Visit (INDEPENDENT_AMBULATORY_CARE_PROVIDER_SITE_OTHER): Payer: Medicare Other | Admitting: Gastroenterology

## 2021-06-14 ENCOUNTER — Encounter (INDEPENDENT_AMBULATORY_CARE_PROVIDER_SITE_OTHER): Payer: Self-pay | Admitting: Gastroenterology

## 2021-06-14 DIAGNOSIS — K219 Gastro-esophageal reflux disease without esophagitis: Secondary | ICD-10-CM | POA: Diagnosis not present

## 2021-06-14 DIAGNOSIS — D649 Anemia, unspecified: Secondary | ICD-10-CM | POA: Diagnosis not present

## 2021-06-14 DIAGNOSIS — K7581 Nonalcoholic steatohepatitis (NASH): Secondary | ICD-10-CM | POA: Diagnosis not present

## 2021-06-14 DIAGNOSIS — N186 End stage renal disease: Secondary | ICD-10-CM | POA: Diagnosis not present

## 2021-06-14 DIAGNOSIS — D696 Thrombocytopenia, unspecified: Secondary | ICD-10-CM | POA: Diagnosis not present

## 2021-06-14 DIAGNOSIS — I50810 Right heart failure, unspecified: Secondary | ICD-10-CM | POA: Diagnosis not present

## 2021-06-15 DIAGNOSIS — I50810 Right heart failure, unspecified: Secondary | ICD-10-CM | POA: Diagnosis not present

## 2021-06-15 DIAGNOSIS — D509 Iron deficiency anemia, unspecified: Secondary | ICD-10-CM | POA: Diagnosis not present

## 2021-06-15 DIAGNOSIS — D649 Anemia, unspecified: Secondary | ICD-10-CM | POA: Diagnosis not present

## 2021-06-15 DIAGNOSIS — D631 Anemia in chronic kidney disease: Secondary | ICD-10-CM | POA: Diagnosis not present

## 2021-06-15 DIAGNOSIS — N250 Renal osteodystrophy: Secondary | ICD-10-CM | POA: Diagnosis not present

## 2021-06-15 DIAGNOSIS — Z992 Dependence on renal dialysis: Secondary | ICD-10-CM | POA: Diagnosis not present

## 2021-06-15 DIAGNOSIS — N186 End stage renal disease: Secondary | ICD-10-CM | POA: Diagnosis not present

## 2021-06-15 DIAGNOSIS — K219 Gastro-esophageal reflux disease without esophagitis: Secondary | ICD-10-CM | POA: Diagnosis not present

## 2021-06-15 DIAGNOSIS — K7581 Nonalcoholic steatohepatitis (NASH): Secondary | ICD-10-CM | POA: Diagnosis not present

## 2021-06-15 DIAGNOSIS — D696 Thrombocytopenia, unspecified: Secondary | ICD-10-CM | POA: Diagnosis not present

## 2021-06-16 DIAGNOSIS — N186 End stage renal disease: Secondary | ICD-10-CM | POA: Diagnosis not present

## 2021-06-16 DIAGNOSIS — D696 Thrombocytopenia, unspecified: Secondary | ICD-10-CM | POA: Diagnosis not present

## 2021-06-16 DIAGNOSIS — K7581 Nonalcoholic steatohepatitis (NASH): Secondary | ICD-10-CM | POA: Diagnosis not present

## 2021-06-16 DIAGNOSIS — I50810 Right heart failure, unspecified: Secondary | ICD-10-CM | POA: Diagnosis not present

## 2021-06-16 DIAGNOSIS — D649 Anemia, unspecified: Secondary | ICD-10-CM | POA: Diagnosis not present

## 2021-06-16 DIAGNOSIS — K219 Gastro-esophageal reflux disease without esophagitis: Secondary | ICD-10-CM | POA: Diagnosis not present

## 2021-06-17 DIAGNOSIS — D509 Iron deficiency anemia, unspecified: Secondary | ICD-10-CM | POA: Diagnosis not present

## 2021-06-17 DIAGNOSIS — D631 Anemia in chronic kidney disease: Secondary | ICD-10-CM | POA: Diagnosis not present

## 2021-06-17 DIAGNOSIS — N186 End stage renal disease: Secondary | ICD-10-CM | POA: Diagnosis not present

## 2021-06-17 DIAGNOSIS — N250 Renal osteodystrophy: Secondary | ICD-10-CM | POA: Diagnosis not present

## 2021-06-17 DIAGNOSIS — Z992 Dependence on renal dialysis: Secondary | ICD-10-CM | POA: Diagnosis not present

## 2021-06-17 NOTE — Progress Notes (Deleted)
Cardiology Office Note   Date:  06/17/2021   ID:  William Flores, DOB 24-Jan-1950, MRN [id number]  PCP:  [name], [name], MD  Cardiologist:   Minus Breeding, MD   No chief complaint on file.     History of Present Illness: William Flores is a 71 y.o. male who presents for evaluation of atrial fibrillation and previous cardiomyopathy. As previously seen in [place] but wants to switch his care here. He had respiratory failure in February with combined acute systolic and diastolic heart failure and anemia 5-40% probably related to A. fib with rapid ventricular rate. In 2016 his atrial fibrillation rate was controlled. He did have volume overload and acute renal insufficiency and required CVVHD. His EF however have improved to normal. He was again admitted in mid May 2023 with respiratory failure due to noncompliance CPAP. He required dialysis. He was diureses and lost 29 pounds. Meds were adjusted to reduce his calcium channel blocker because of bradycardia.  He had anemia requiring transfusion.  This was from hematemesis.  Anticoagulants were on hold secondary to this.  He had elevated liver enzymes secondary to [place].  There were elevated enzymes thought to be demand ischemia.  His EF was well-preserved at 60 to 65%.     Septic shock secondary to multifocal pneumonia -He does have chronic hypotension on midodrine (specially around hemodialysis treatment). -MRSA PCR negative -Patient has completed IV antibiotics while inpatient. -Continue midodrine on the use of Solu-Cortef. -Currently off Levophed; blood pressure marginal with a systolic blood pressure in the 90s.  Will assess patient tolerance of hemodialysis without pressors support. -Condition is critical, overall poor prognosis and guarded outcome. -Outpatient follow-up with palliative care.   Acute on chronic hypoxemic respiratory failure -Chronically using 2 L nasal cannula O2 at baseline -Insetting of multifocal  pneumonia, missed dialysis sessions and vascular congestion/interstitial edema due to right-sided heart failure. -CXR showing worsening findings of CHF or fluid overload, increased moderate pleural effusions, persistent left basilar consolidation with bilateral increased opacities in the right-greater-than-left consistent with edema, pneumonia or combination -Continue volume management per hemodialysis as tolerated -Patient has completed a total of 7 days of antibiotics and is currently afebrile. -Continue supportive care and wean off oxygen supplementation as tolerated. -Patient is afebrile and is stable.   Hematemesis, acute blood loss anemia -Transfused 2 unit packed red blood cells 5/3  -Continue PPI -Continue to hold Eliquis/IV heparin -Appreciate assistance/recommendations by GI service.    -No plans for endoscopy currently.     ESRD on HD TTS -Nephrology following -HD 5/4, 5/6; first dialysis without the need of pressors on 05/11/2021 and 05/13/21 almost 6.3 L removed and well-tolerated. -Will continue to closely follow his tolerance and recommendations by nephrology service. -Next anticipated treatment 05/15/2021. -Okay to continue midodrine and Solu-Cortef.   Acute metabolic encephalopathy -Oriented x3; somnolent and very sleepy. -Following commands appropriately and just severely weak/deconditioned. -Continue constant reorientation and supportive care -Continue to follow response.  Chronic A-fib -Eliquis/IV heparin on hold due to hematemesis -Continue holding metoprolol in the setting of hypotension. -Rate controlled and stable currently. -Continue telemetry monitoring.   Chronic diastolic heart failure/RV failure  NICM -Continue volume management with hemodialysis; tolerated almost 3.3 L removal with dialysis on 05/13/2021; next treatment 05/15/2021. -Patient has not required pressor supports. -Continue to closely follow clinical response and stability. -If patient  tolerates dialysis on 05/15/2021, will plan to discharge back to skilled nursing facility on 05/16/2021. -Cardiology has signed off at this  point at this no other treatment that they can offer him. -Continue to follow daily weights/strict I's and O's.   Pulmonary hypertension -Continue the use of sildenafil.  NASH cirrhosis with elevated LFTs -Continue rifaximin and lactulose. -Mentation is normal and is stable.. -Will follow recommendations by GI; overall very poor prognosis -Concerned that his elevated LFTs are secondary to hepatic congestion. -Continue to follow LFTs trend. -Continue volume management per hemodialysis.   Nausea, vomiting, abdominal pain -CT abdomen pelvis revealed possible acute pancreatitis, lipase 19 -Patient reports no nausea or vomiting currently; also denies abdominal pain.  Tolerating diet. -Continue supportive care and follow clinical response. -Continue as needed antiemetics.   Diabetes mellitus type 2 with renal complications -P7T 4.3 -Continue SSI -Continue to follow CBG fluctuation. -Very likely elevated in the setting of Solu-Cortef usage.   Demand ischemia -In setting of hypotension, ESRD -Echocardiogram showed EF 60 to 65%, no regional wall motion abnormality, RV pressure overload with reduced RV systolic function; going along with severe pulmonary hypertension and right heart failure. -Cardiology service recommending transition to hospice and comfort care if unable to tolerate dialysis without pressors. -Patient has remained off pressors support and has tolerated x2 dialysis with requiring only albumin infusion. -Reports no chest pain.   Hypothyroidism -Continue the use of Synthroid. -Patient encouraged to take medication on empty stomach in the morning.   HLD -Continue the use of Lipitor -Heart healthy diet discussed with patient.   Morbid obesity -Low calorie diet and portion control discussed with patient. -Body mass index is 41.47  kg/m.      *** he has done well.  The patient denies any new symptoms such as chest discomfort, neck or arm discomfort. There has been no new shortness of breath, PND or orthopnea. There have been no reported palpitations, presyncope or syncope.  He reports he is compliant with his medications and he is trying to watch his salt. He is weighing himself daily and his weights are staying about to 60 area  Past Medical History:  Diagnosis Date   Anemia    CAD (coronary artery disease)    LHC 2/08:  mRCA 50%, o/w no sig CAD, EF 25%   Cardiomyopathy ([place])    EF 35-40% in 2/16 in setting of AF with RVR >> echo 5/16 with improved LVF with EF 65-70%   CHF (congestive heart failure) (HCC)    Chronic atrial fibrillation (HCC)    Chronic diastolic heart failure (HCC)    HFPF - EF ~65-70% (OFTEN EXACERBATED BY AFIB)   Cirrhosis of liver (HCC)    CKD (chronic kidney disease)    hx of worsening renal failure and hyperK+ in setting of acute diast CHF >> required CVVHD in 4/16 and dialysis x 1 in 5/16   CKD stage 3 due to type 2 diabetes mellitus ([place]) 09/17/2015   Colon cancer ([place])    Colon polyps 09/21/2012   Tubular adenoma   Diabetes (HCC)    GERD (gastroesophageal reflux disease)    Hyperlipidemia    Hypertension    Hypothyroidism    Insomnia    Nonalcoholic steatohepatitis    Obesity hypoventilation syndrome ([place])    Pulmonary hypertension ([place])    Renal insufficiency    Sleep apnea    Thrombocytopenia ([place])     Past Surgical History:  Procedure Laterality Date   AV FISTULA PLACEMENT Right 03/02/2021   Procedure: RIGHT ARM ARTERIOVENOUS (AV) FISTULA CREATION;  Surgeon: [name], MD;  Location: [facility];  Service:  Vascular;  Laterality: Right;   CIRCUMCISION     COLON RESECTION     COLONOSCOPY N/A 09/21/2012   Procedure: COLONOSCOPY;  Surgeon: Inda Castle, MD;  Location: [facility];  Service: Endoscopy;  Laterality: N/A;   COLONOSCOPY N/A 12/28/2018   Procedure:  COLONOSCOPY;  Surgeon: Rogene Houston, MD;  Location: [facility];  Service: Endoscopy;  Laterality: N/A;   COLONOSCOPY WITH PROPOFOL N/A 10/17/2020   Procedure: COLONOSCOPY WITH PROPOFOL;  Surgeon: Harvel Quale, MD;  Location: [facility];  Service: Gastroenterology;  Laterality: N/A;   COLONOSCOPY WITH PROPOFOL N/A 04/25/2021   Procedure: COLONOSCOPY WITH PROPOFOL;  Surgeon: Rogene Houston, MD;  Location: [facility];  Service: Endoscopy;  Laterality: N/A;   ENTEROSCOPY N/A 06/24/2020   Procedure: ENTEROSCOPY;  Surgeon: Gatha Mayer, MD;  Location[facility];  Service: Endoscopy;  Laterality: N/A;   ESOPHAGOGASTRODUODENOSCOPY N/A 12/28/2018   Procedure: ESOPHAGOGASTRODUODENOSCOPY (EGD);  Surgeon: Rogene Houston, MD;  [facility];  Service: Endoscopy;  Laterality: N/A;   HOT HEMOSTASIS  04/25/2021   Procedure: HOT HEMOSTASIS (ARGON PLASMA COAGULATION/BICAP);  Surgeon: Rogene Houston, MD;  [facility]: [facility];  Service: Endoscopy;;   IR FLUORO GUIDE CV LINE RIGHT  12/10/2020   IR US GUIDE VASC ACCESS RIGHT  12/11/2020   POLYPECTOMY  10/17/2020   Procedure: POLYPECTOMY;  Surgeon: Harvel Quale, MD;  Location: [facility];  Service: Gastroenterology;;  rectal, transverse, descending, ascending, sigmoid   RIGHT HEART CATH N/A 04/11/2019   Procedure: RIGHT HEART CATH;  Surgeon: Larey Dresser, MD;  Location: [place] [facility];  Service: Cardiovascular;  Laterality: N/A;   US ECHOCARDIOGRAPHY  03/04/2010   abnormal study     Current Outpatient Medications  Medication Sig Dispense Refill   acetaminophen (TYLENOL) 160 MG/5ML liquid Take 650 mg by mouth every 4 (four) hours as needed for fever.     allopurinol (ZYLOPRIM) 100 MG tablet Take 200 mg by mouth daily.     Amino Acids-Protein Hydrolys (PRO-STAT) LIQD Take 30 mLs by mouth daily.     atorvastatin (LIPITOR) 20 MG tablet TAKE 1 TABLET BY MOUTH EVERY DAY (Patient taking differently:  Take 20 mg by mouth at bedtime.) 90 tablet 3   B Complex-C (B-COMPLEX WITH VITAMIN C) tablet Take 1 tablet by mouth daily.     Cholecalciferol (VITAMIN D) 125 MCG (5000 UT) CAPS Take 5,000 Units by mouth daily.     ENULOSE 10 GM/15ML SOLN Take 20 g by mouth daily.     ferrous sulfate 325 (65 FE) MG tablet Take 325 mg by mouth daily with breakfast.     glucose blood (CONTOUR NEXT TEST) test strip Test BS TID Dx E11.29 300 strip 3   guaiFENesin (ROBITUSSIN) 100 MG/5ML liquid Take 5 mLs by mouth every 4 (four) hours as needed for cough or to loosen phlegm.     hydrocortisone (CORTEF) 10 MG tablet Take 1 tablet (10 mg total) by mouth 2 (two) times daily. 60 tablet 2   insulin aspart (NOVOLOG) 100 UNIT/ML injection Before each meal 3 times a day, 140-199 - 2 units, 200-250 - 4 units, 251-299 - 6 units,  300-349 - 8 units,  350 or above 10 units. Insulin PEN if approved, provide syringes and needles if needed. 10 mL 0   insulin detemir (LEVEMIR) 100 UNIT/ML injection Inject 0.08 mLs (8 Units total) into the skin at bedtime. 10 mL 11   Insulin Pen Needle  32G X 4 MM MISC Use to give insulin daily Dx E11.29 100 each 3   levothyroxine (SYNTHROID) 100 MCG tablet Take 100 mcg by mouth every morning.     melatonin 5 MG TABS Take 5 mg by mouth at bedtime as needed (insomnia).     midodrine (PROAMATINE) 10 MG tablet Take 1 tablet (10 mg total) by mouth 3 (three) times daily with meals. 90 tablet 3   pantoprazole (PROTONIX) 40 MG tablet Take 1 tablet (40 mg total) by mouth 2 (two) times daily. 60 tablet 1   polyethylene glycol (MIRALAX / GLYCOLAX) 17 g packet Take 17 g by mouth daily. 14 each 0   rifaximin (XIFAXAN) 550 MG TABS tablet Take 1 tablet (550 mg total) by mouth 2 (two) times daily. 60 tablet 1   sildenafil (REVATIO) 20 MG tablet Take 1 tablet (20 mg total) by mouth 3 (three) times daily. 90 tablet 11   traMADol (ULTRAM) 50 MG tablet Take 1 tablet (50 mg total) by mouth every 8 (eight) hours as needed  for severe pain. 15 tablet 0   No current facility-administered medications for this visit.   Facility-Administered Medications Ordered in Other Visits  Medication Dose Route Frequency Provider Last Rate Last Admin   0.9 %  sodium chloride infusion  100 mL Intravenous PRN Dwana Melena, MD       0.9 %  sodium chloride infusion  100 mL Intravenous PRN Dwana Melena, MD       alteplase (CATHFLO ACTIVASE) injection 2 mg  2 mg Intracatheter Once PRN Dwana Melena, MD       heparin injection 1,000 Units  1,000 Units Dialysis PRN Dwana Melena, MD       lidocaine (PF) (XYLOCAINE) 1 % injection 5 mL  5 mL Intradermal PRN Dwana Melena, MD       lidocaine-prilocaine (EMLA) cream 1 application.  1 application  Topical PRN Dwana Melena, MD       pentafluoroprop-tetrafluoroeth [place]) aerosol 1 application.  1 application  Topical PRN Dwana Melena, MD        Allergies:   Codeine, Diclofenac, Ibuprofen, Indomethacin, Meloxicam, and Naproxen    ROS:  Please see the history of present illness.   Otherwise, review of systems are positive for ***.   All other systems are reviewed and negative.    PHYSICAL EXAM: VS:  There were no vitals taken for this visit. , BMI There is no height or weight on file to calculate BMI. GENERAL:  Well appearing NECK:  No jugular venous distention, waveform within normal limits, carotid upstroke brisk and symmetric, no bruits, no thyromegaly LUNGS:  Clear to auscultation bilaterally CHEST:  Unremarkable HEART:  PMI not displaced or sustained,S1 and S2 within normal limits, no S3, no clicks, no rubs, *** murmurs, irregular ABD:  Flat, positive bowel sounds normal in frequency in pitch, no bruits, no rebound, no guarding, no midline pulsatile mass, no hepatomegaly, no splenomegaly EXT:  2 plus pulses throughout, no edema, no cyanosis no clubbing     ***GENERAL:  Well appearing HEENT:  Pupils equal round and reactive, fundi not visualized, oral mucosa unremarkable NECK:   No jugular venous distention, waveform within normal limits, carotid upstroke brisk and symmetric, no bruits, no thyromegaly LYMPHATICS:  No cervical, inguinal adenopathy LUNGS:  Clear to auscultation bilaterally BACK:  No CVA tenderness CHEST:  Unremarkable HEART:  PMI not displaced or sustained,S1 and S2 within normal limits, no I7,OM clicks,  no rubs, no murmurs, irregular ABD:  Flat, positive bowel sounds normal in frequency in pitch, no bruits, no rebound, no guarding, no midline pulsatile mass, no hepatomegaly, no splenomegaly, umbilical hernia. EXT:  2 plus pulses throughout, trace lower extremity edema, no cyanosis no clubbing, chronic venous stasis changes SKIN:  No rashes no nodules NEURO:  Cranial nerves II through XII grossly intact, motor grossly intact throughout PSYCH:  Cognitively intact, oriented to person place and time    EKG:  EKG is *** ordered today. ***  Recent Labs: 05/04/2021: B Natriuretic Peptide 1,371.0 05/05/2021: Magnesium 2.3 05/14/2021: ALT 24 05/27/2021: BUN 68; Creatinine, Ser 8.66; Hemoglobin 11.3; Platelets 42; Potassium 4.6; Sodium 136    Lipid Panel    Component Value Date/Time   CHOL 103 03/19/2020 1311   TRIG 140 03/19/2020 1311   TRIG 177 (H) 07/18/2013 1547   HDL 20 (L) 03/19/2020 1311   HDL 24 (L) 07/18/2013 1547   CHOLHDL 5.2 (H) 03/19/2020 1311   CHOLHDL 7.5 02/11/2014 0750   VLDL 23 02/11/2014 0750   LDLCALC 58 03/19/2020 1311   LDLCALC 67 07/18/2013 1547   LDLDIRECT 57 05/01/2017 1530      Wt Readings from Last 3 Encounters:  05/27/21 240 lb (108.9 kg)  05/26/21 241 lb 9.9 oz (109.6 kg)  05/16/21 241 lb 10 oz (109.6 kg)      Other studies Reviewed: Additional studies/ records that were reviewed today include: Echo results from April and May. Review of the above records demonstrates:  Please see elsewhere in the note.     ASSESSMENT AND PLAN:  Chronic diastolic HF/RV failure:  *** - 11/2020 echo: LVEF 55-60% no WMAs, indet  diastolic function, D-septum, severe RV dysfunction and enlargement, severe pulm HTN, mild to mod MR,  05/04/21 echo: LVEF 88-50%, indet diastolic, Dseptum, severe RV dysfunction and enlargement, severe BAE - fluid is managed by HD-due tomorrow - levophed support as needed to support HD, if can't do dialysis tomorrow hospice will be recommended per hospitalist -Volume management per nephrology   Pulmonary HTN:  ***  - followed in HF clinic - combined group II and III pulmonary HTN. Diastolic HF, OSA/OHS.  04/2019 RHC: mean PA 53, PCWP 23, CI 2.71 PVR 5 WU - has been on sildenafil per CHF clinic but this is on hold due to hypotension   Hypotension:  ***  -some degree of chronic hypotension with soft blood pressure this morning102/56 mmHg - previously on midodrine at home, from CHF Jan 2023 note plan to change from daily to just as needed for HD - on presentation 80/53, MAP 62. Sepsis workup per primary team. He is on emperic abx.  - midodrine restarted yesterday 10 mg 3 times daily -Remains on nor epi for BP support during dialysis   ESRD:  ***  -HD per nephrology, normally T,Th, Sat -Hemodialysis been limited due to hypotension from GI bleed -Palliative care is now involved and considering transition to comfort care.  The patient's been made DNR but wants to continue dialysis   Elevated LFTs:  ***  - perhaps related to hypotension vs congestion - history of Cirrhosis/NASH, though LFTs normal earlier this month   Pneumonia:  ***  - abx per primary team   Afib rate controlled:  ***  - anticoag on hold due to coffee ground emesis   Elevated troponin:  ***  - in setting of fluid overload, hypotension - at this time think ACS less likely   History of GI bleed:  ***  -  admit earlier this month with GI bleed, anemia requiring transusion - patient with coffee ground emesis this admit, anticoag on hold - required 2 units pRBCs   Palliative care consultation:  *** ,  multiple advanced  severe comorbidities with multiple acute severe medical conditions.      Current medicines are reviewed at length with the patient today.  The patient does not have concerns regarding medicines.  The following changes have been made:  ***  Labs/ tests ordered today include: *** No orders of the defined types were placed in this encounter.    Disposition:   FU with me in ***   Signed, Minus Breeding, MD  06/17/2021 2:53 PM    [facility]

## 2021-06-18 ENCOUNTER — Ambulatory Visit: Payer: Medicare Other | Admitting: Cardiology

## 2021-06-19 DIAGNOSIS — D631 Anemia in chronic kidney disease: Secondary | ICD-10-CM | POA: Diagnosis not present

## 2021-06-19 DIAGNOSIS — Z992 Dependence on renal dialysis: Secondary | ICD-10-CM | POA: Diagnosis not present

## 2021-06-19 DIAGNOSIS — N250 Renal osteodystrophy: Secondary | ICD-10-CM | POA: Diagnosis not present

## 2021-06-19 DIAGNOSIS — N186 End stage renal disease: Secondary | ICD-10-CM | POA: Diagnosis not present

## 2021-06-19 DIAGNOSIS — D509 Iron deficiency anemia, unspecified: Secondary | ICD-10-CM | POA: Diagnosis not present

## 2021-06-21 DIAGNOSIS — N186 End stage renal disease: Secondary | ICD-10-CM | POA: Diagnosis not present

## 2021-06-21 DIAGNOSIS — K219 Gastro-esophageal reflux disease without esophagitis: Secondary | ICD-10-CM | POA: Diagnosis not present

## 2021-06-21 DIAGNOSIS — I50810 Right heart failure, unspecified: Secondary | ICD-10-CM | POA: Diagnosis not present

## 2021-06-21 DIAGNOSIS — K7581 Nonalcoholic steatohepatitis (NASH): Secondary | ICD-10-CM | POA: Diagnosis not present

## 2021-06-21 DIAGNOSIS — D649 Anemia, unspecified: Secondary | ICD-10-CM | POA: Diagnosis not present

## 2021-06-21 DIAGNOSIS — D696 Thrombocytopenia, unspecified: Secondary | ICD-10-CM | POA: Diagnosis not present

## 2021-06-22 DIAGNOSIS — K219 Gastro-esophageal reflux disease without esophagitis: Secondary | ICD-10-CM | POA: Diagnosis not present

## 2021-06-22 DIAGNOSIS — K7581 Nonalcoholic steatohepatitis (NASH): Secondary | ICD-10-CM | POA: Diagnosis not present

## 2021-06-22 DIAGNOSIS — N186 End stage renal disease: Secondary | ICD-10-CM | POA: Diagnosis not present

## 2021-06-22 DIAGNOSIS — I50810 Right heart failure, unspecified: Secondary | ICD-10-CM | POA: Diagnosis not present

## 2021-06-22 DIAGNOSIS — Z992 Dependence on renal dialysis: Secondary | ICD-10-CM | POA: Diagnosis not present

## 2021-06-22 DIAGNOSIS — D649 Anemia, unspecified: Secondary | ICD-10-CM | POA: Diagnosis not present

## 2021-06-22 DIAGNOSIS — D509 Iron deficiency anemia, unspecified: Secondary | ICD-10-CM | POA: Diagnosis not present

## 2021-06-22 DIAGNOSIS — D631 Anemia in chronic kidney disease: Secondary | ICD-10-CM | POA: Diagnosis not present

## 2021-06-22 DIAGNOSIS — D696 Thrombocytopenia, unspecified: Secondary | ICD-10-CM | POA: Diagnosis not present

## 2021-06-22 DIAGNOSIS — N250 Renal osteodystrophy: Secondary | ICD-10-CM | POA: Diagnosis not present

## 2021-06-24 DIAGNOSIS — D631 Anemia in chronic kidney disease: Secondary | ICD-10-CM | POA: Diagnosis not present

## 2021-06-24 DIAGNOSIS — N186 End stage renal disease: Secondary | ICD-10-CM | POA: Diagnosis not present

## 2021-06-24 DIAGNOSIS — N250 Renal osteodystrophy: Secondary | ICD-10-CM | POA: Diagnosis not present

## 2021-06-24 DIAGNOSIS — Z992 Dependence on renal dialysis: Secondary | ICD-10-CM | POA: Diagnosis not present

## 2021-06-24 DIAGNOSIS — D509 Iron deficiency anemia, unspecified: Secondary | ICD-10-CM | POA: Diagnosis not present

## 2021-06-25 ENCOUNTER — Telehealth: Payer: Self-pay

## 2021-06-25 DIAGNOSIS — D649 Anemia, unspecified: Secondary | ICD-10-CM | POA: Diagnosis not present

## 2021-06-25 DIAGNOSIS — I50810 Right heart failure, unspecified: Secondary | ICD-10-CM | POA: Diagnosis not present

## 2021-06-25 DIAGNOSIS — K219 Gastro-esophageal reflux disease without esophagitis: Secondary | ICD-10-CM | POA: Diagnosis not present

## 2021-06-25 DIAGNOSIS — N186 End stage renal disease: Secondary | ICD-10-CM | POA: Diagnosis not present

## 2021-06-25 DIAGNOSIS — K7581 Nonalcoholic steatohepatitis (NASH): Secondary | ICD-10-CM | POA: Diagnosis not present

## 2021-06-25 DIAGNOSIS — D696 Thrombocytopenia, unspecified: Secondary | ICD-10-CM | POA: Diagnosis not present

## 2021-06-26 DIAGNOSIS — Z992 Dependence on renal dialysis: Secondary | ICD-10-CM | POA: Diagnosis not present

## 2021-06-26 DIAGNOSIS — D509 Iron deficiency anemia, unspecified: Secondary | ICD-10-CM | POA: Diagnosis not present

## 2021-06-26 DIAGNOSIS — D631 Anemia in chronic kidney disease: Secondary | ICD-10-CM | POA: Diagnosis not present

## 2021-06-26 DIAGNOSIS — N186 End stage renal disease: Secondary | ICD-10-CM | POA: Diagnosis not present

## 2021-06-26 DIAGNOSIS — N250 Renal osteodystrophy: Secondary | ICD-10-CM | POA: Diagnosis not present

## 2021-06-28 ENCOUNTER — Telehealth: Payer: Self-pay

## 2021-06-28 ENCOUNTER — Other Ambulatory Visit: Payer: Medicare Other | Admitting: Urology

## 2021-06-28 DIAGNOSIS — I50810 Right heart failure, unspecified: Secondary | ICD-10-CM | POA: Diagnosis not present

## 2021-06-28 DIAGNOSIS — D696 Thrombocytopenia, unspecified: Secondary | ICD-10-CM | POA: Diagnosis not present

## 2021-06-28 DIAGNOSIS — D649 Anemia, unspecified: Secondary | ICD-10-CM | POA: Diagnosis not present

## 2021-06-28 DIAGNOSIS — K219 Gastro-esophageal reflux disease without esophagitis: Secondary | ICD-10-CM | POA: Diagnosis not present

## 2021-06-28 DIAGNOSIS — K7581 Nonalcoholic steatohepatitis (NASH): Secondary | ICD-10-CM | POA: Diagnosis not present

## 2021-06-28 DIAGNOSIS — N186 End stage renal disease: Secondary | ICD-10-CM | POA: Diagnosis not present

## 2021-06-28 NOTE — Progress Notes (Deleted)
Assessment: 1. Gross hematuria   2. History of UTI   3. ESRD (end stage renal disease) on dialysis [facility])      Plan: His episodes of hematuria appear to be related to his UTIs.   Please obtain a urinalysis and fax these results to [phone] Return to office in 3-4 weeks for cystoscopy Please transport patient in [place] chair while for procedure to be performed in the office.  Chief Complaint:  No chief complaint on file.   History of Present Illness:  William Flores is a 71 y.o. year old male who is seen for further evaluation of gross hematuria, history of UTI, and balanitis.  The patient has multiple medical problems and is currently on hemodialysis for end-stage renal disease.  He had an episode of gross hematuria in January 2023.  This was associated with dysuria.  No flank or abdominal pain.  He was told he had a UTI and treated with antibiotics.  The gross hematuria subsequently resolved.  He was not having any dysuria or other urinary symptoms at his visit on 02/02/21.  He voids approximately twice per day while on hemodialysis.  No history of UTIs or kidney stones.    He does have a remote history of tobacco use but has not smoked for over 30 years. CT imaging from 10/22 showed no renal or ureteral calculi and no evidence of obstruction. IPSS = 4. He was found to have balanitis on physical examination.  He was treated with nystatin cream.  At his visit in March 2023, he reported no further episodes of gross hematuria.  He reported that a urine sample was obtained at his nursing facility with some evidence of a UTI and he was treated with some antibiotics.  He reported some occasional dysuria.  His balanitis had improved.  Urine culture results: 02/09/2021 E. Coli  -treated with Macrobid x7 days 01/06/2021 E. coli, Proteus, Enterococcus -treated with fosfomycin 3/23  Proteus, E. Coli.  He has been admitted to the hospital several times since his last visit.  His most recent  hospitalization was in May 2023 for sepsis secondary to pneumonia.  Urinalysis at that time showed >50 WBCs, 0-5 RBCs, and many bacteria.  No culture was performed. CT imaging from 05/05/2021 showed no renal calculi or hydronephrosis, anterior bladder wall thickening with inflammatory fat stranding.  He presents today for cystoscopy.    Portions of the above documentation were copied from a prior visit for review purposes only.   Past Medical History:  Past Medical History:  Diagnosis Date   Anemia    CAD (coronary artery disease)    LHC 2/08:  mRCA 50%, o/w no sig CAD, EF 25%   Cardiomyopathy ([place])    EF 35-40% in 2/16 in setting of AF with RVR >> echo 5/16 with improved LVF with EF 65-70%   CHF (congestive heart failure) (HCC)    Chronic atrial fibrillation (HCC)    Chronic diastolic heart failure (HCC)    HFPF - EF ~65-70% (OFTEN EXACERBATED BY AFIB)   Cirrhosis of liver (HCC)    CKD (chronic kidney disease)    hx of worsening renal failure and hyperK+ in setting of acute diast CHF >> required CVVHD in 4/16 and dialysis x 1 in 5/16   CKD stage 3 due to type 2 diabetes mellitus ([place]) 09/17/2015   Colon cancer ([place])    Colon polyps 09/21/2012   Tubular adenoma   Diabetes (HCC)    GERD (gastroesophageal reflux disease)  Hyperlipidemia    Hypertension    Hypothyroidism    Insomnia    Nonalcoholic steatohepatitis    Obesity hypoventilation syndrome ([place])    Pulmonary hypertension (HCC)    Renal insufficiency    Sleep apnea    Thrombocytopenia (HCC)     Past Surgical History:  Past Surgical History:  Procedure Laterality Date   AV FISTULA PLACEMENT Right 03/02/2021   Procedure: RIGHT ARM ARTERIOVENOUS (AV) FISTULA CREATION;  Surgeon: [name], MD;  Location: [facility];  Service: Vascular;  Laterality: Right;   CIRCUMCISION     COLON RESECTION     COLONOSCOPY N/A 09/21/2012   Procedure: COLONOSCOPY;  Surgeon: Inda Castle, MD;  Location: [facility];  Service:  Endoscopy;  Laterality: N/A;   COLONOSCOPY N/A 12/28/2018   Procedure: COLONOSCOPY;  Surgeon: Rogene Houston, MD;  Location: [facility];  Service: Endoscopy;  Laterality: N/A;   COLONOSCOPY WITH PROPOFOL N/A 10/17/2020   Procedure: COLONOSCOPY WITH PROPOFOL;  Surgeon: Harvel Quale, MD;  Location: [facility];  Service: Gastroenterology;  Laterality: N/A;   COLONOSCOPY WITH PROPOFOL N/A 04/25/2021   Procedure: COLONOSCOPY WITH PROPOFOL;  Surgeon: Rogene Houston, MD;  Location: [facility];  Service: Endoscopy;  Laterality: N/A;   ENTEROSCOPY N/A 06/24/2020   Procedure: ENTEROSCOPY;  Surgeon: Gatha Mayer, MD;  Location: [facility] ENDOSCOPY;  Service: Endoscopy;  Laterality: N/A;   ESOPHAGOGASTRODUODENOSCOPY N/A 12/28/2018   Procedure: ESOPHAGOGASTRODUODENOSCOPY (EGD);  Surgeon: Rogene Houston, MD;  [facility];  Service: Endoscopy;  Laterality: N/A;   HOT HEMOSTASIS  04/25/2021   Procedure: HOT HEMOSTASIS (ARGON PLASMA COAGULATION/BICAP);  Surgeon: Rogene Houston, MD;  [facility];  Service: Endoscopy;;   IR FLUORO GUIDE CV LINE RIGHT  12/10/2020   IR US GUIDE VASC ACCESS RIGHT  12/11/2020   POLYPECTOMY  10/17/2020   Procedure: POLYPECTOMY;  Surgeon: Harvel Quale, MD;  Location: [facility];  Service: Gastroenterology;;  rectal, transverse, descending, ascending, sigmoid   RIGHT HEART CATH N/A 04/11/2019   Procedure: RIGHT HEART CATH;  Surgeon: Larey Dresser, MD;  Location: [facility];  Service: Cardiovascular;  Laterality: N/A;   US ECHOCARDIOGRAPHY  03/04/2010   abnormal study    Allergies:  Allergies  Allergen Reactions   Codeine     Headache    Diclofenac    Ibuprofen    Indomethacin    Meloxicam    Naproxen     Family History:  Family History  Problem Relation Age of Onset   Breast cancer Mother    Diabetes Mother    Heart attack Father     Social History:  Social History   Tobacco Use   Smoking  status: Former    Types: Cigarettes    Quit date: 08/06/1983    Years since quitting: 37.9   Smokeless tobacco: Never  Vaping Use   Vaping Use: Never used  Substance Use Topics   Alcohol use: No   Drug use: No    ROS: Constitutional:  Negative for fever, chills, weight loss CV: Negative for chest pain, previous MI, hypertension Respiratory:  Negative for shortness of breath, wheezing, sleep apnea, frequent cough GI:  Negative for nausea, vomiting, bloody stool, GERD  Physical exam: There were no vitals taken for this visit. GENERAL APPEARANCE:  Well appearing, well developed, well nourished, NAD HEENT:  Atraumatic, normocephalic, oropharynx clear NECK:  Supple without lymphadenopathy or thyromegaly ABDOMEN:  Soft, non-tender, no masses  EXTREMITIES:  Moves all extremities well, without clubbing, cyanosis, or edema NEUROLOGIC:  Alert and oriented x 3, normal gait, CN II-XII grossly intact MENTAL STATUS:  appropriate BACK:  Non-tender to palpation, No CVAT SKIN:  Warm, dry, and intact  Results: U/A:  Procedure:  Flexible Cystourethroscopy  Pre-operative Diagnosis: {cysto diagnosis:26394}  Post-operative Diagnosis: {cysto diagnosis:26394}  Anesthesia:  local with lidocaine jelly  Surgical Narrative:  After appropriate informed consent was obtained, the patient was prepped and draped in the usual sterile fashion in the supine position.  The patient was correctly identified and the proper procedure delineated prior to proceeding.  Sterile lidocaine gel was instilled in the urethra. The flexible cystoscope was introduced without difficulty.  Findings:  Anterior urethra: {anterior urethral findings:26395}  Posterior urethra: {post urethral findings:26396}  Bladder: {bladder findings:26397}  Ureteral orifices: {Normal/Abnormal Appearance:21344::"normal"}  Additional findings:  Saline bladder wash for cytology {WAS/WAS [phone]::"was not"} performed.    The  cystoscope was then removed.  The patient tolerated the procedure well.

## 2021-06-28 NOTE — Telephone Encounter (Signed)
Called to inform nurse of rescheduling apt as well as Dr. Laverle Hobby previous message.  Phone was disconnected during transfer to his nurse.  Called back to leave a message with front desk that his apt will need to be rescheduled for today and to have the nurse call back for instructions on patient's meds per Dr. Pete Glatter.  Waiting for call back .

## 2021-06-29 DIAGNOSIS — D649 Anemia, unspecified: Secondary | ICD-10-CM | POA: Diagnosis not present

## 2021-06-29 DIAGNOSIS — Z992 Dependence on renal dialysis: Secondary | ICD-10-CM | POA: Diagnosis not present

## 2021-06-29 DIAGNOSIS — N250 Renal osteodystrophy: Secondary | ICD-10-CM | POA: Diagnosis not present

## 2021-06-29 DIAGNOSIS — K7581 Nonalcoholic steatohepatitis (NASH): Secondary | ICD-10-CM | POA: Diagnosis not present

## 2021-06-29 DIAGNOSIS — D631 Anemia in chronic kidney disease: Secondary | ICD-10-CM | POA: Diagnosis not present

## 2021-06-29 DIAGNOSIS — K219 Gastro-esophageal reflux disease without esophagitis: Secondary | ICD-10-CM | POA: Diagnosis not present

## 2021-06-29 DIAGNOSIS — D509 Iron deficiency anemia, unspecified: Secondary | ICD-10-CM | POA: Diagnosis not present

## 2021-06-29 DIAGNOSIS — I50810 Right heart failure, unspecified: Secondary | ICD-10-CM | POA: Diagnosis not present

## 2021-06-29 DIAGNOSIS — D696 Thrombocytopenia, unspecified: Secondary | ICD-10-CM | POA: Diagnosis not present

## 2021-06-29 DIAGNOSIS — N186 End stage renal disease: Secondary | ICD-10-CM | POA: Diagnosis not present

## 2021-06-30 ENCOUNTER — Ambulatory Visit (INDEPENDENT_AMBULATORY_CARE_PROVIDER_SITE_OTHER): Payer: Medicare Other | Admitting: Vascular Surgery

## 2021-06-30 ENCOUNTER — Encounter: Payer: Self-pay | Admitting: Vascular Surgery

## 2021-06-30 VITALS — BP 102/67 | HR 79 | Temp 97.7°F | Resp 14 | Ht 64.0 in | Wt 239.8 lb

## 2021-06-30 DIAGNOSIS — K219 Gastro-esophageal reflux disease without esophagitis: Secondary | ICD-10-CM | POA: Diagnosis not present

## 2021-06-30 DIAGNOSIS — K7581 Nonalcoholic steatohepatitis (NASH): Secondary | ICD-10-CM | POA: Diagnosis not present

## 2021-06-30 DIAGNOSIS — D649 Anemia, unspecified: Secondary | ICD-10-CM | POA: Diagnosis not present

## 2021-06-30 DIAGNOSIS — D696 Thrombocytopenia, unspecified: Secondary | ICD-10-CM | POA: Diagnosis not present

## 2021-06-30 DIAGNOSIS — N186 End stage renal disease: Secondary | ICD-10-CM | POA: Diagnosis not present

## 2021-06-30 DIAGNOSIS — N189 Chronic kidney disease, unspecified: Secondary | ICD-10-CM | POA: Diagnosis not present

## 2021-06-30 DIAGNOSIS — I50810 Right heart failure, unspecified: Secondary | ICD-10-CM | POA: Diagnosis not present

## 2021-06-30 DIAGNOSIS — Z992 Dependence on renal dialysis: Secondary | ICD-10-CM

## 2021-06-30 DIAGNOSIS — E119 Type 2 diabetes mellitus without complications: Secondary | ICD-10-CM | POA: Diagnosis not present

## 2021-06-30 NOTE — Progress Notes (Signed)
[facility] [place]  Patient name: William Flores MRN: [id number] DOB: 1950/08/10 Sex: male  REASON FOR VISIT: Follow-up right arm access  HPI: William Flores is a 71 y.o. male here today for follow-up of his right arm access.  He had initial placement of a right brachiocephalic fistula by myself on 03/02/2021.  My last office visit with him was in 03/31/2021.  At that time he had a good Abbigal Radich maturation of his fistula and felt to be able to use it at 3 months out.  He does continue to have dialysis via his catheter.  He was found at the dialysis center to no longer have fistula thrill or bruit and is here today for further evaluation.  He denies any steal symptoms.  He is left-handed.  Past Medical History:  Diagnosis Date   Anemia    CAD (coronary artery disease)    LHC 2/08:  mRCA 50%, o/w no sig CAD, EF 25%   Cardiomyopathy ([place])    EF 35-40% in 2/16 in setting of AF with RVR >> echo 5/16 with improved LVF with EF 65-70%   CHF (congestive heart failure) (HCC)    Chronic atrial fibrillation (HCC)    Chronic diastolic heart failure (HCC)    HFPF - EF ~65-70% (OFTEN EXACERBATED BY AFIB)   Cirrhosis of liver (HCC)    CKD (chronic kidney disease)    hx of worsening renal failure and hyperK+ in setting of acute diast CHF >> required CVVHD in 4/16 and dialysis x 1 in 5/16   CKD stage 3 due to type 2 diabetes mellitus ([place]) 09/17/2015   Colon cancer ([place])    Colon polyps 09/21/2012   Tubular adenoma   Diabetes (HCC)    GERD (gastroesophageal reflux disease)    Hyperlipidemia    Hypertension    Hypothyroidism    Insomnia    Nonalcoholic steatohepatitis    Obesity hypoventilation syndrome ([place])    Pulmonary hypertension (HCC)    Renal insufficiency    Sleep apnea    Thrombocytopenia ([place])     Family History  Problem Relation Age of Onset   Breast cancer Mother    Diabetes Mother    Heart attack Father     SOCIAL  HISTORY: Social History   Tobacco Use   Smoking status: Former    Types: Cigarettes    Quit date: 08/06/1983    Years since quitting: 37.9   Smokeless tobacco: Never  Substance Use Topics   Alcohol use: No    Allergies  Allergen Reactions   Codeine     Headache    Diclofenac    Ibuprofen    Indomethacin    Meloxicam    Naproxen     Current Outpatient Medications  Medication Sig Dispense Refill   acetaminophen (TYLENOL) 160 MG/5ML liquid Take 650 mg by mouth every 4 (four) hours as needed for fever.     allopurinol (ZYLOPRIM) 100 MG tablet Take 200 mg by mouth daily.     Amino Acids-Protein Hydrolys (PRO-STAT) LIQD Take 30 mLs by mouth daily.     atorvastatin (LIPITOR) 20 MG tablet TAKE 1 TABLET BY MOUTH EVERY DAY (Patient taking differently: Take 20 mg by mouth at bedtime.) 90 tablet 3   B Complex-C (B-COMPLEX WITH VITAMIN C) tablet Take 1 tablet by mouth daily.     Cholecalciferol (VITAMIN D) 125 MCG (5000 UT) CAPS Take 5,000 Units by mouth daily.     ENULOSE 10  GM/15ML SOLN Take 20 g by mouth daily.     ferrous sulfate 325 (65 FE) MG tablet Take 325 mg by mouth daily with breakfast.     glucose blood (CONTOUR NEXT TEST) test strip Test BS TID Dx E11.29 300 strip 3   guaiFENesin (ROBITUSSIN) 100 MG/5ML liquid Take 5 mLs by mouth every 4 (four) hours as needed for cough or to loosen phlegm.     hydrocortisone (CORTEF) 10 MG tablet Take 1 tablet (10 mg total) by mouth 2 (two) times daily. 60 tablet 2   insulin aspart (NOVOLOG) 100 UNIT/ML injection Before each meal 3 times a day, 140-199 - 2 units, 200-250 - 4 units, 251-299 - 6 units,  300-349 - 8 units,  350 or above 10 units. Insulin PEN if approved, provide syringes and needles if needed. 10 mL 0   insulin detemir (LEVEMIR) 100 UNIT/ML injection Inject 0.08 mLs (8 Units total) into the skin at bedtime. 10 mL 11   Insulin Pen Needle 32G X 4 MM MISC Use to give insulin daily Dx E11.29 100 each 3   levothyroxine (SYNTHROID)  100 MCG tablet Take 100 mcg by mouth every morning.     melatonin 5 MG TABS Take 5 mg by mouth at bedtime as needed (insomnia).     midodrine (PROAMATINE) 10 MG tablet Take 1 tablet (10 mg total) by mouth 3 (three) times daily with meals. 90 tablet 3   pantoprazole (PROTONIX) 40 MG tablet Take 1 tablet (40 mg total) by mouth 2 (two) times daily. 60 tablet 1   polyethylene glycol (MIRALAX / GLYCOLAX) 17 g packet Take 17 g by mouth daily. 14 each 0   rifaximin (XIFAXAN) 550 MG TABS tablet Take 1 tablet (550 mg total) by mouth 2 (two) times daily. 60 tablet 1   sildenafil (REVATIO) 20 MG tablet Take 1 tablet (20 mg total) by mouth 3 (three) times daily. 90 tablet 11   traMADol (ULTRAM) 50 MG tablet Take 1 tablet (50 mg total) by mouth every 8 (eight) hours as needed for severe pain. 15 tablet 0   No current facility-administered medications for this visit.   Facility-Administered Medications Ordered in Other Visits  Medication Dose Route Frequency Provider Last Rate Last Admin   0.9 %  sodium chloride infusion  100 mL Intravenous PRN Dwana Melena, MD       0.9 %  sodium chloride infusion  100 mL Intravenous PRN Dwana Melena, MD       alteplase (CATHFLO ACTIVASE) injection 2 mg  2 mg Intracatheter Once PRN Dwana Melena, MD       heparin injection 1,000 Units  1,000 Units Dialysis PRN Dwana Melena, MD       lidocaine (PF) (XYLOCAINE) 1 % injection 5 mL  5 mL Intradermal PRN Dwana Melena, MD       lidocaine-prilocaine (EMLA) cream 1 application.  1 application  Topical PRN Dwana Melena, MD       pentafluoroprop-tetrafluoroeth Landry Dyke) aerosol 1 application.  1 application  Topical PRN Dwana Melena, MD        REVIEW OF SYSTEMS:  [X]  denotes positive finding, [ ]  denotes negative finding Cardiac  Comments:  Chest pain or chest pressure:    Shortness of breath upon exertion:    Short of breath when lying flat:    Irregular heart rhythm:        Vascular    Pain in calf, thigh, or hip brought  on by ambulation:    Pain in feet at night that wakes you up from your sleep:     Blood clot in your veins:    Leg swelling:           PHYSICAL EXAM: Vitals:   06/30/21 0913  BP: 102/67  Pulse: 79  Resp: 14  Temp: 97.7 F (36.5 C)  TempSrc: Temporal  SpO2: 100%  Weight: 239 lb 12.8 oz (108.8 kg)  Height: 5' 4"  (1.626 m)    GENERAL: The patient is a well-nourished male, in no acute distress. The vital signs are documented above. CARDIOVASCULAR: No palpable radial pulses bilaterally.  Well-healed antecubital incision on the right with no thrill or bruit present. PULMONARY: There is good air exchange  MUSCULOSKELETAL: There are no major deformities or cyanosis. NEUROLOGIC: No focal weakness or paresthesias are detected. SKIN: There are no ulcers or rashes noted. PSYCHIATRIC: The patient has a normal affect.  DATA:  I imaged his fistula with SonoSite ultrasound.  His cephalic vein is patent above the anastomosis but is occluded for several centimeters along the anastomosis.  He does have a patent basilic vein.  He has multiple small branches near the antecubital space and a larger basilic vein in the mid upper arm  MEDICAL ISSUES: I discussed options with the patient.  I have recommended a prosthetic graft rather than reattempting fistula creation.  Explained this would be available for use 1 month after placement.  He does have hemodialysis on Tuesday Thursday and Saturday so therefore would have to have a alteration of his normal hemodialysis today.  He is questioning continues to use his catheter only.  I explained that this does put him at significantly high risk for infection over time.  We will schedule new right upper arm AV Gore-Tex graft placement as an outpatient at [facility].  He is on chronic Coumadin therapy and will need to have this held for the appropriate time prior to surgery.  This is due to cardiac arrhythmia    [name], MD [facility]  [facility] [phone]  Note: Portions of this report may have been transcribed using voice recognition software.  Every effort has been made to ensure accuracy; however, inadvertent computerized transcription errors may still be present.

## 2021-07-01 ENCOUNTER — Telehealth: Payer: Self-pay

## 2021-07-01 ENCOUNTER — Other Ambulatory Visit: Payer: Self-pay

## 2021-07-01 DIAGNOSIS — N186 End stage renal disease: Secondary | ICD-10-CM | POA: Diagnosis not present

## 2021-07-01 DIAGNOSIS — D509 Iron deficiency anemia, unspecified: Secondary | ICD-10-CM | POA: Diagnosis not present

## 2021-07-01 DIAGNOSIS — Z992 Dependence on renal dialysis: Secondary | ICD-10-CM | POA: Diagnosis not present

## 2021-07-01 DIAGNOSIS — N250 Renal osteodystrophy: Secondary | ICD-10-CM | POA: Diagnosis not present

## 2021-07-01 DIAGNOSIS — D631 Anemia in chronic kidney disease: Secondary | ICD-10-CM | POA: Diagnosis not present

## 2021-07-01 MED ORDER — CIPROFLOXACIN HCL 500 MG PO TABS: 500.0000 mg | ORAL_TABLET | Freq: Every day | ORAL | 0 refills | Status: DC

## 2021-07-01 NOTE — Telephone Encounter (Signed)
-----   Message from Primus Bravo, MD sent at 06/24/2021  1:25 PM EDT ----- Please notify nursing home to begin Cipro 500 mg PO daily x 5 days.  Cipro should be given AFTER hemodialysis on dialysis days. Please make sure he will be transported in reclining chair for his visit on 6/26 for cystoscopy.

## 2021-07-01 NOTE — Telephone Encounter (Signed)
Spoke with Shermine at [facility] given for Cipro instructions  [phone] fax number Voiced understanding of how to take medication on dialysis days. Patient to be transported in chair for cystoscopy. Appt given to staff at [facility] as well.

## 2021-07-02 DIAGNOSIS — D649 Anemia, unspecified: Secondary | ICD-10-CM | POA: Diagnosis not present

## 2021-07-02 DIAGNOSIS — K219 Gastro-esophageal reflux disease without esophagitis: Secondary | ICD-10-CM | POA: Diagnosis not present

## 2021-07-02 DIAGNOSIS — I50810 Right heart failure, unspecified: Secondary | ICD-10-CM | POA: Diagnosis not present

## 2021-07-02 DIAGNOSIS — K7581 Nonalcoholic steatohepatitis (NASH): Secondary | ICD-10-CM | POA: Diagnosis not present

## 2021-07-02 DIAGNOSIS — N186 End stage renal disease: Secondary | ICD-10-CM | POA: Diagnosis not present

## 2021-07-02 DIAGNOSIS — Z992 Dependence on renal dialysis: Secondary | ICD-10-CM | POA: Diagnosis not present

## 2021-07-02 DIAGNOSIS — D696 Thrombocytopenia, unspecified: Secondary | ICD-10-CM | POA: Diagnosis not present

## 2021-07-03 DIAGNOSIS — I272 Pulmonary hypertension, unspecified: Secondary | ICD-10-CM | POA: Diagnosis not present

## 2021-07-03 DIAGNOSIS — N186 End stage renal disease: Secondary | ICD-10-CM | POA: Diagnosis not present

## 2021-07-03 DIAGNOSIS — I50810 Right heart failure, unspecified: Secondary | ICD-10-CM | POA: Diagnosis not present

## 2021-07-03 DIAGNOSIS — D696 Thrombocytopenia, unspecified: Secondary | ICD-10-CM | POA: Diagnosis not present

## 2021-07-03 DIAGNOSIS — K7581 Nonalcoholic steatohepatitis (NASH): Secondary | ICD-10-CM | POA: Diagnosis not present

## 2021-07-03 DIAGNOSIS — I10 Essential (primary) hypertension: Secondary | ICD-10-CM | POA: Diagnosis not present

## 2021-07-03 DIAGNOSIS — Z992 Dependence on renal dialysis: Secondary | ICD-10-CM | POA: Diagnosis not present

## 2021-07-03 DIAGNOSIS — E1169 Type 2 diabetes mellitus with other specified complication: Secondary | ICD-10-CM | POA: Diagnosis not present

## 2021-07-03 DIAGNOSIS — D509 Iron deficiency anemia, unspecified: Secondary | ICD-10-CM | POA: Diagnosis not present

## 2021-07-03 DIAGNOSIS — N250 Renal osteodystrophy: Secondary | ICD-10-CM | POA: Diagnosis not present

## 2021-07-03 DIAGNOSIS — K219 Gastro-esophageal reflux disease without esophagitis: Secondary | ICD-10-CM | POA: Diagnosis not present

## 2021-07-03 DIAGNOSIS — D649 Anemia, unspecified: Secondary | ICD-10-CM | POA: Diagnosis not present

## 2021-07-05 DIAGNOSIS — D696 Thrombocytopenia, unspecified: Secondary | ICD-10-CM | POA: Diagnosis not present

## 2021-07-05 DIAGNOSIS — Z9889 Other specified postprocedural states: Secondary | ICD-10-CM | POA: Diagnosis not present

## 2021-07-05 DIAGNOSIS — R531 Weakness: Secondary | ICD-10-CM | POA: Diagnosis not present

## 2021-07-05 DIAGNOSIS — K219 Gastro-esophageal reflux disease without esophagitis: Secondary | ICD-10-CM | POA: Diagnosis not present

## 2021-07-05 DIAGNOSIS — N186 End stage renal disease: Secondary | ICD-10-CM | POA: Diagnosis not present

## 2021-07-05 DIAGNOSIS — I50810 Right heart failure, unspecified: Secondary | ICD-10-CM | POA: Diagnosis not present

## 2021-07-05 DIAGNOSIS — K7581 Nonalcoholic steatohepatitis (NASH): Secondary | ICD-10-CM | POA: Diagnosis not present

## 2021-07-05 DIAGNOSIS — R52 Pain, unspecified: Secondary | ICD-10-CM | POA: Diagnosis not present

## 2021-07-05 DIAGNOSIS — D649 Anemia, unspecified: Secondary | ICD-10-CM | POA: Diagnosis not present

## 2021-07-05 DIAGNOSIS — Z79899 Other long term (current) drug therapy: Secondary | ICD-10-CM | POA: Diagnosis not present

## 2021-07-06 DIAGNOSIS — N250 Renal osteodystrophy: Secondary | ICD-10-CM | POA: Diagnosis not present

## 2021-07-06 DIAGNOSIS — N186 End stage renal disease: Secondary | ICD-10-CM | POA: Diagnosis not present

## 2021-07-06 DIAGNOSIS — Z992 Dependence on renal dialysis: Secondary | ICD-10-CM | POA: Diagnosis not present

## 2021-07-06 DIAGNOSIS — D509 Iron deficiency anemia, unspecified: Secondary | ICD-10-CM | POA: Diagnosis not present

## 2021-07-07 DIAGNOSIS — K7581 Nonalcoholic steatohepatitis (NASH): Secondary | ICD-10-CM | POA: Diagnosis not present

## 2021-07-07 DIAGNOSIS — D649 Anemia, unspecified: Secondary | ICD-10-CM | POA: Diagnosis not present

## 2021-07-07 DIAGNOSIS — N186 End stage renal disease: Secondary | ICD-10-CM | POA: Diagnosis not present

## 2021-07-07 DIAGNOSIS — K219 Gastro-esophageal reflux disease without esophagitis: Secondary | ICD-10-CM | POA: Diagnosis not present

## 2021-07-07 DIAGNOSIS — D696 Thrombocytopenia, unspecified: Secondary | ICD-10-CM | POA: Diagnosis not present

## 2021-07-07 DIAGNOSIS — I50810 Right heart failure, unspecified: Secondary | ICD-10-CM | POA: Diagnosis not present

## 2021-07-08 DIAGNOSIS — D649 Anemia, unspecified: Secondary | ICD-10-CM | POA: Diagnosis not present

## 2021-07-08 DIAGNOSIS — K219 Gastro-esophageal reflux disease without esophagitis: Secondary | ICD-10-CM | POA: Diagnosis not present

## 2021-07-08 DIAGNOSIS — N250 Renal osteodystrophy: Secondary | ICD-10-CM | POA: Diagnosis not present

## 2021-07-08 DIAGNOSIS — I50810 Right heart failure, unspecified: Secondary | ICD-10-CM | POA: Diagnosis not present

## 2021-07-08 DIAGNOSIS — D509 Iron deficiency anemia, unspecified: Secondary | ICD-10-CM | POA: Diagnosis not present

## 2021-07-08 DIAGNOSIS — N186 End stage renal disease: Secondary | ICD-10-CM | POA: Diagnosis not present

## 2021-07-08 DIAGNOSIS — K7581 Nonalcoholic steatohepatitis (NASH): Secondary | ICD-10-CM | POA: Diagnosis not present

## 2021-07-08 DIAGNOSIS — Z992 Dependence on renal dialysis: Secondary | ICD-10-CM | POA: Diagnosis not present

## 2021-07-08 DIAGNOSIS — D696 Thrombocytopenia, unspecified: Secondary | ICD-10-CM | POA: Diagnosis not present

## 2021-07-09 ENCOUNTER — Telehealth: Payer: Self-pay

## 2021-07-09 NOTE — Telephone Encounter (Signed)
William Flores with [facility] called to get clarification on cipro rx.  Tried to call facility back with no answer and no way to leave message.  Re-faxed instructions and rx to facility informing them.

## 2021-07-10 DIAGNOSIS — N250 Renal osteodystrophy: Secondary | ICD-10-CM | POA: Diagnosis not present

## 2021-07-10 DIAGNOSIS — N186 End stage renal disease: Secondary | ICD-10-CM | POA: Diagnosis not present

## 2021-07-10 DIAGNOSIS — Z992 Dependence on renal dialysis: Secondary | ICD-10-CM | POA: Diagnosis not present

## 2021-07-10 DIAGNOSIS — D509 Iron deficiency anemia, unspecified: Secondary | ICD-10-CM | POA: Diagnosis not present

## 2021-07-12 ENCOUNTER — Other Ambulatory Visit: Payer: Medicare Other | Admitting: Urology

## 2021-07-12 DIAGNOSIS — D696 Thrombocytopenia, unspecified: Secondary | ICD-10-CM | POA: Diagnosis not present

## 2021-07-12 DIAGNOSIS — K7581 Nonalcoholic steatohepatitis (NASH): Secondary | ICD-10-CM | POA: Diagnosis not present

## 2021-07-12 DIAGNOSIS — K219 Gastro-esophageal reflux disease without esophagitis: Secondary | ICD-10-CM | POA: Diagnosis not present

## 2021-07-12 DIAGNOSIS — D649 Anemia, unspecified: Secondary | ICD-10-CM | POA: Diagnosis not present

## 2021-07-12 DIAGNOSIS — N186 End stage renal disease: Secondary | ICD-10-CM | POA: Diagnosis not present

## 2021-07-12 DIAGNOSIS — I50810 Right heart failure, unspecified: Secondary | ICD-10-CM | POA: Diagnosis not present

## 2021-07-12 NOTE — Progress Notes (Deleted)
Assessment: 1. Gross hematuria   2. History of UTI   3. ESRD (end stage renal disease) on dialysis [facility])     Plan: His episodes of hematuria appear to be related to his UTIs.   Please obtain a urinalysis and fax these results to [phone] Return to office in 3-4 weeks for cystoscopy Please transport patient in [place] chair while for procedure to be performed in the office.  Chief Complaint:  No chief complaint on file.   History of Present Illness:  William Flores is a 71 y.o. year old male who is seen for further evaluation of gross hematuria, history of UTI, and balanitis.  The patient has multiple medical problems and is currently on hemodialysis for end-stage renal disease.  He had an episode of gross hematuria in January 2023.  This was associated with dysuria.  No flank or abdominal pain.  He was told he had a UTI and treated with antibiotics.  The gross hematuria subsequently resolved.  He was not having any dysuria or other urinary symptoms at his visit on 02/02/21.  He voids approximately twice per day while on hemodialysis.  No history of UTIs or kidney stones.    He does have a remote history of tobacco use but has not smoked for over 30 years. CT imaging from 10/22 showed no renal or ureteral calculi and no evidence of obstruction. IPSS = 4. He was found to have balanitis on physical examination.  He was treated with nystatin cream.  At his visit in March 2023, he reported no further episodes of gross hematuria.  He reported that a urine sample was obtained at his nursing facility with some evidence of a UTI and he was treated with some antibiotics.  He reported some occasional dysuria.  His balanitis had improved.  Urine culture results: 02/09/2021 E. Coli  -treated with Macrobid x7 days 01/06/2021 E. coli, Proteus, Enterococcus -treated with fosfomycin 3/23  Proteus, E. Coli.  He has been admitted to the hospital several times since his last visit.  His most recent  hospitalization was in May 2023 for sepsis secondary to pneumonia.  Urinalysis at that time showed >50 WBCs, 0-5 RBCs, and many bacteria.  No culture was performed. CT imaging from 05/05/2021 showed no renal calculi or hydronephrosis, anterior bladder wall thickening with inflammatory fat stranding. Urine culture from 05/25/2021 grew E. coli.  He was treated with fosfomycin.  He presents today for cystoscopy.    Portions of the above documentation were copied from a prior visit for review purposes only.   Past Medical History:  Past Medical History:  Diagnosis Date   Anemia    CAD (coronary artery disease)    LHC 2/08:  mRCA 50%, o/w no sig CAD, EF 25%   Cardiomyopathy ([place])    EF 35-40% in 2/16 in setting of AF with RVR >> echo 5/16 with improved LVF with EF 65-70%   CHF (congestive heart failure) (HCC)    Chronic atrial fibrillation (HCC)    Chronic diastolic heart failure (HCC)    HFPF - EF ~65-70% (OFTEN EXACERBATED BY AFIB)   Cirrhosis of liver (HCC)    CKD (chronic kidney disease)    hx of worsening renal failure and hyperK+ in setting of acute diast CHF >> required CVVHD in 4/16 and dialysis x 1 in 5/16   CKD stage 3 due to type 2 diabetes mellitus ([place]) 09/17/2015   Colon cancer ([place])    Colon polyps 09/21/2012   Tubular  adenoma   Diabetes ([place])    GERD (gastroesophageal reflux disease)    Hyperlipidemia    Hypertension    Hypothyroidism    Insomnia    Nonalcoholic steatohepatitis    Obesity hypoventilation syndrome ([place])    Pulmonary hypertension (HCC)    Renal insufficiency    Sleep apnea    Thrombocytopenia ([place])     Past Surgical History:  Past Surgical History:  Procedure Laterality Date   AV FISTULA PLACEMENT Right 03/02/2021   Procedure: RIGHT ARM ARTERIOVENOUS (AV) FISTULA CREATION;  Surgeon: [name], MD;  Location: [facility] OR[facility];  Service: Vascular;  Laterality: Right;   CIRCUMCISION     COLON RESECTION     COLONOSCOPY N/A 09/21/2012   Procedure:  COLONOSCOPY;  Surgeon: Inda Castle, MD;  Location: [facility] ENDOS[facility];  Service: Endoscopy;  Laterality: N/A;   COLONOSCOPY N/A 12/28/2018   Procedure: COLONOSCOPY;  Surgeon: Rogene Houston, MD;  Location: [facility];  Service: Endoscopy;  Laterality: N/A;   COLONOSCOPY WITH PROPOFOL N/A 10/17/2020   Procedure: COLONOSCOPY WITH PROPOFOL;  Surgeon: Harvel Quale, MD;  Location: [facility];  Service: Gastroenterology;  Laterality: N/A;   COLONOSCOPY WITH PROPOFOL N/A 04/25/2021   Procedure: COLONOSCOPY WITH PROPOFOL;  Surgeon: Rogene Houston, MD;  Location: [facility];  Service: Endoscopy;  Laterality: N/A;   ENTEROSCOPY N/A 06/24/2020   Procedure: ENTEROSCOPY;  Surgeon: Gatha Mayer, MD;  Location[facility] ENDOSCOPY;  Service: Endoscopy;  Laterality: N/A;   ESOPHAGOGASTRODUODENOSCOPY N/A 12/28/2018   Procedure: ESOPHAGOGASTRODUODENOSCOPY (EGD);  Surgeon: Rogene Houston, MD;  [facility];  Service: Endoscopy;  Laterality: N/A;   HOT HEMOSTASIS  04/25/2021   Procedure: HOT HEMOSTASIS (ARGON PLASMA COAGULATION/BICAP);  Surgeon: Rogene Houston, MD;  [facility]: [facility];  Service: Endoscopy;;   IR FLUORO GUIDE CV LINE RIGHT  12/10/2020   IR US GUIDE VASC ACCESS RIGHT  12/11/2020   POLYPECTOMY  10/17/2020   Procedure: POLYPECTOMY;  Surgeon: Harvel Quale, MD;  Location: [facility];  Service: Gastroenterology;;  rectal, transverse, descending, ascending, sigmoid   RIGHT HEART CATH N/A 04/11/2019   Procedure: RIGHT HEART CATH;  Surgeon: Larey Dresser, MD;  Location: [facility];  Service: Cardiovascular;  Laterality: N/A;   US ECHOCARDIOGRAPHY  03/04/2010   abnormal study    Allergies:  Allergies  Allergen Reactions   Codeine     Headache    Diclofenac    Ibuprofen    Indomethacin    Meloxicam    Naproxen     Family History:  Family History  Problem Relation Age of Onset   Breast cancer Mother    Diabetes Mother    Heart attack  Father     Social History:  Social History   Tobacco Use   Smoking status: Former    Types: Cigarettes    Quit date: 08/06/1983    Years since quitting: 37.9   Smokeless tobacco: Never  Vaping Use   Vaping Use: Never used  Substance Use Topics   Alcohol use: No   Drug use: No    ROS: Constitutional:  Negative for fever, chills, weight loss CV: Negative for chest pain, previous MI, hypertension Respiratory:  Negative for shortness of breath, wheezing, sleep apnea, frequent cough GI:  Negative for nausea, vomiting, bloody stool, GERD  Physical exam: There were no vitals taken for this visit. GENERAL APPEARANCE:  Well appearing, well developed, well nourished, NAD HEENT:  Atraumatic, normocephalic,  oropharynx clear NECK:  Supple without lymphadenopathy or thyromegaly ABDOMEN:  Soft, non-tender, no masses EXTREMITIES:  Moves all extremities well, without clubbing, cyanosis, or edema NEUROLOGIC:  Alert and oriented x 3, normal gait, CN II-XII grossly intact MENTAL STATUS:  appropriate BACK:  Non-tender to palpation, No CVAT SKIN:  Warm, dry, and intact  Results: U/A:  Procedure:  Flexible Cystourethroscopy  Pre-operative Diagnosis: {cysto diagnosis:26394}  Post-operative Diagnosis: {cysto diagnosis:26394}  Anesthesia:  local with lidocaine jelly  Surgical Narrative:  After appropriate informed consent was obtained, the patient was prepped and draped in the usual sterile fashion in the supine position.  The patient was correctly identified and the proper procedure delineated prior to proceeding.  Sterile lidocaine gel was instilled in the urethra. The flexible cystoscope was introduced without difficulty.  Findings:  Anterior urethra: {anterior urethral findings:26395}  Posterior urethra: {post urethral findings:26396}  Bladder: {bladder findings:26397}  Ureteral orifices: {Normal/Abnormal Appearance:21344::"normal"}  Additional findings:  Saline bladder wash  for cytology {WAS/WAS NOT:[phone]::"was not"} performed.    The cystoscope was then removed.  The patient tolerated the procedure well.

## 2021-07-13 DIAGNOSIS — D696 Thrombocytopenia, unspecified: Secondary | ICD-10-CM | POA: Diagnosis not present

## 2021-07-13 DIAGNOSIS — K7581 Nonalcoholic steatohepatitis (NASH): Secondary | ICD-10-CM | POA: Diagnosis not present

## 2021-07-13 DIAGNOSIS — N250 Renal osteodystrophy: Secondary | ICD-10-CM | POA: Diagnosis not present

## 2021-07-13 DIAGNOSIS — K219 Gastro-esophageal reflux disease without esophagitis: Secondary | ICD-10-CM | POA: Diagnosis not present

## 2021-07-13 DIAGNOSIS — D509 Iron deficiency anemia, unspecified: Secondary | ICD-10-CM | POA: Diagnosis not present

## 2021-07-13 DIAGNOSIS — Z992 Dependence on renal dialysis: Secondary | ICD-10-CM | POA: Diagnosis not present

## 2021-07-13 DIAGNOSIS — D649 Anemia, unspecified: Secondary | ICD-10-CM | POA: Diagnosis not present

## 2021-07-13 DIAGNOSIS — I50810 Right heart failure, unspecified: Secondary | ICD-10-CM | POA: Diagnosis not present

## 2021-07-13 DIAGNOSIS — N186 End stage renal disease: Secondary | ICD-10-CM | POA: Diagnosis not present

## 2021-07-15 DIAGNOSIS — K219 Gastro-esophageal reflux disease without esophagitis: Secondary | ICD-10-CM | POA: Diagnosis not present

## 2021-07-15 DIAGNOSIS — N186 End stage renal disease: Secondary | ICD-10-CM | POA: Diagnosis not present

## 2021-07-15 DIAGNOSIS — Z992 Dependence on renal dialysis: Secondary | ICD-10-CM | POA: Diagnosis not present

## 2021-07-15 DIAGNOSIS — D649 Anemia, unspecified: Secondary | ICD-10-CM | POA: Diagnosis not present

## 2021-07-15 DIAGNOSIS — N250 Renal osteodystrophy: Secondary | ICD-10-CM | POA: Diagnosis not present

## 2021-07-15 DIAGNOSIS — D509 Iron deficiency anemia, unspecified: Secondary | ICD-10-CM | POA: Diagnosis not present

## 2021-07-15 DIAGNOSIS — I50810 Right heart failure, unspecified: Secondary | ICD-10-CM | POA: Diagnosis not present

## 2021-07-15 DIAGNOSIS — K7581 Nonalcoholic steatohepatitis (NASH): Secondary | ICD-10-CM | POA: Diagnosis not present

## 2021-07-15 DIAGNOSIS — D696 Thrombocytopenia, unspecified: Secondary | ICD-10-CM | POA: Diagnosis not present

## 2021-07-17 DIAGNOSIS — N250 Renal osteodystrophy: Secondary | ICD-10-CM | POA: Diagnosis not present

## 2021-07-17 DIAGNOSIS — N186 End stage renal disease: Secondary | ICD-10-CM | POA: Diagnosis not present

## 2021-07-17 DIAGNOSIS — Z992 Dependence on renal dialysis: Secondary | ICD-10-CM | POA: Diagnosis not present

## 2021-07-17 DIAGNOSIS — D509 Iron deficiency anemia, unspecified: Secondary | ICD-10-CM | POA: Diagnosis not present

## 2021-07-19 DIAGNOSIS — K7581 Nonalcoholic steatohepatitis (NASH): Secondary | ICD-10-CM | POA: Diagnosis not present

## 2021-07-19 DIAGNOSIS — D696 Thrombocytopenia, unspecified: Secondary | ICD-10-CM | POA: Diagnosis not present

## 2021-07-19 DIAGNOSIS — K219 Gastro-esophageal reflux disease without esophagitis: Secondary | ICD-10-CM | POA: Diagnosis not present

## 2021-07-19 DIAGNOSIS — D649 Anemia, unspecified: Secondary | ICD-10-CM | POA: Diagnosis not present

## 2021-07-19 DIAGNOSIS — I50810 Right heart failure, unspecified: Secondary | ICD-10-CM | POA: Diagnosis not present

## 2021-07-19 DIAGNOSIS — N186 End stage renal disease: Secondary | ICD-10-CM | POA: Diagnosis not present

## 2021-07-20 DIAGNOSIS — Z992 Dependence on renal dialysis: Secondary | ICD-10-CM | POA: Diagnosis not present

## 2021-07-20 DIAGNOSIS — D649 Anemia, unspecified: Secondary | ICD-10-CM | POA: Diagnosis not present

## 2021-07-20 DIAGNOSIS — K7581 Nonalcoholic steatohepatitis (NASH): Secondary | ICD-10-CM | POA: Diagnosis not present

## 2021-07-20 DIAGNOSIS — N186 End stage renal disease: Secondary | ICD-10-CM | POA: Diagnosis not present

## 2021-07-20 DIAGNOSIS — D509 Iron deficiency anemia, unspecified: Secondary | ICD-10-CM | POA: Diagnosis not present

## 2021-07-20 DIAGNOSIS — D696 Thrombocytopenia, unspecified: Secondary | ICD-10-CM | POA: Diagnosis not present

## 2021-07-20 DIAGNOSIS — K219 Gastro-esophageal reflux disease without esophagitis: Secondary | ICD-10-CM | POA: Diagnosis not present

## 2021-07-20 DIAGNOSIS — I50810 Right heart failure, unspecified: Secondary | ICD-10-CM | POA: Diagnosis not present

## 2021-07-20 DIAGNOSIS — N250 Renal osteodystrophy: Secondary | ICD-10-CM | POA: Diagnosis not present

## 2021-07-22 DIAGNOSIS — N250 Renal osteodystrophy: Secondary | ICD-10-CM | POA: Diagnosis not present

## 2021-07-22 DIAGNOSIS — D509 Iron deficiency anemia, unspecified: Secondary | ICD-10-CM | POA: Diagnosis not present

## 2021-07-22 DIAGNOSIS — D696 Thrombocytopenia, unspecified: Secondary | ICD-10-CM | POA: Diagnosis not present

## 2021-07-22 DIAGNOSIS — Z992 Dependence on renal dialysis: Secondary | ICD-10-CM | POA: Diagnosis not present

## 2021-07-22 DIAGNOSIS — K219 Gastro-esophageal reflux disease without esophagitis: Secondary | ICD-10-CM | POA: Diagnosis not present

## 2021-07-22 DIAGNOSIS — N186 End stage renal disease: Secondary | ICD-10-CM | POA: Diagnosis not present

## 2021-07-22 DIAGNOSIS — K7581 Nonalcoholic steatohepatitis (NASH): Secondary | ICD-10-CM | POA: Diagnosis not present

## 2021-07-22 DIAGNOSIS — I50810 Right heart failure, unspecified: Secondary | ICD-10-CM | POA: Diagnosis not present

## 2021-07-22 DIAGNOSIS — D649 Anemia, unspecified: Secondary | ICD-10-CM | POA: Diagnosis not present

## 2021-07-24 DIAGNOSIS — K7581 Nonalcoholic steatohepatitis (NASH): Secondary | ICD-10-CM | POA: Diagnosis not present

## 2021-07-24 DIAGNOSIS — I50810 Right heart failure, unspecified: Secondary | ICD-10-CM | POA: Diagnosis not present

## 2021-07-24 DIAGNOSIS — D696 Thrombocytopenia, unspecified: Secondary | ICD-10-CM | POA: Diagnosis not present

## 2021-07-24 DIAGNOSIS — K219 Gastro-esophageal reflux disease without esophagitis: Secondary | ICD-10-CM | POA: Diagnosis not present

## 2021-07-24 DIAGNOSIS — N250 Renal osteodystrophy: Secondary | ICD-10-CM | POA: Diagnosis not present

## 2021-07-24 DIAGNOSIS — N186 End stage renal disease: Secondary | ICD-10-CM | POA: Diagnosis not present

## 2021-07-24 DIAGNOSIS — D509 Iron deficiency anemia, unspecified: Secondary | ICD-10-CM | POA: Diagnosis not present

## 2021-07-24 DIAGNOSIS — D649 Anemia, unspecified: Secondary | ICD-10-CM | POA: Diagnosis not present

## 2021-07-24 DIAGNOSIS — Z992 Dependence on renal dialysis: Secondary | ICD-10-CM | POA: Diagnosis not present

## 2021-07-26 ENCOUNTER — Encounter (HOSPITAL_COMMUNITY): Payer: Medicare Other | Admitting: Cardiology

## 2021-07-27 DIAGNOSIS — K219 Gastro-esophageal reflux disease without esophagitis: Secondary | ICD-10-CM | POA: Diagnosis not present

## 2021-07-27 DIAGNOSIS — I50810 Right heart failure, unspecified: Secondary | ICD-10-CM | POA: Diagnosis not present

## 2021-07-27 DIAGNOSIS — N186 End stage renal disease: Secondary | ICD-10-CM | POA: Diagnosis not present

## 2021-07-27 DIAGNOSIS — K7581 Nonalcoholic steatohepatitis (NASH): Secondary | ICD-10-CM | POA: Diagnosis not present

## 2021-07-27 DIAGNOSIS — D696 Thrombocytopenia, unspecified: Secondary | ICD-10-CM | POA: Diagnosis not present

## 2021-07-27 DIAGNOSIS — D509 Iron deficiency anemia, unspecified: Secondary | ICD-10-CM | POA: Diagnosis not present

## 2021-07-27 DIAGNOSIS — N250 Renal osteodystrophy: Secondary | ICD-10-CM | POA: Diagnosis not present

## 2021-07-27 DIAGNOSIS — Z992 Dependence on renal dialysis: Secondary | ICD-10-CM | POA: Diagnosis not present

## 2021-07-27 DIAGNOSIS — D649 Anemia, unspecified: Secondary | ICD-10-CM | POA: Diagnosis not present

## 2021-07-28 DIAGNOSIS — D649 Anemia, unspecified: Secondary | ICD-10-CM | POA: Diagnosis not present

## 2021-07-28 DIAGNOSIS — L853 Xerosis cutis: Secondary | ICD-10-CM | POA: Diagnosis not present

## 2021-07-28 DIAGNOSIS — D696 Thrombocytopenia, unspecified: Secondary | ICD-10-CM | POA: Diagnosis not present

## 2021-07-28 DIAGNOSIS — K7581 Nonalcoholic steatohepatitis (NASH): Secondary | ICD-10-CM | POA: Diagnosis not present

## 2021-07-28 DIAGNOSIS — N186 End stage renal disease: Secondary | ICD-10-CM | POA: Diagnosis not present

## 2021-07-28 DIAGNOSIS — M79675 Pain in left toe(s): Secondary | ICD-10-CM | POA: Diagnosis not present

## 2021-07-28 DIAGNOSIS — M79674 Pain in right toe(s): Secondary | ICD-10-CM | POA: Diagnosis not present

## 2021-07-28 DIAGNOSIS — B351 Tinea unguium: Secondary | ICD-10-CM | POA: Diagnosis not present

## 2021-07-28 DIAGNOSIS — K219 Gastro-esophageal reflux disease without esophagitis: Secondary | ICD-10-CM | POA: Diagnosis not present

## 2021-07-28 DIAGNOSIS — I50810 Right heart failure, unspecified: Secondary | ICD-10-CM | POA: Diagnosis not present

## 2021-07-29 DIAGNOSIS — N250 Renal osteodystrophy: Secondary | ICD-10-CM | POA: Diagnosis not present

## 2021-07-29 DIAGNOSIS — D696 Thrombocytopenia, unspecified: Secondary | ICD-10-CM | POA: Diagnosis not present

## 2021-07-29 DIAGNOSIS — Z992 Dependence on renal dialysis: Secondary | ICD-10-CM | POA: Diagnosis not present

## 2021-07-29 DIAGNOSIS — I50810 Right heart failure, unspecified: Secondary | ICD-10-CM | POA: Diagnosis not present

## 2021-07-29 DIAGNOSIS — K7581 Nonalcoholic steatohepatitis (NASH): Secondary | ICD-10-CM | POA: Diagnosis not present

## 2021-07-29 DIAGNOSIS — D649 Anemia, unspecified: Secondary | ICD-10-CM | POA: Diagnosis not present

## 2021-07-29 DIAGNOSIS — D509 Iron deficiency anemia, unspecified: Secondary | ICD-10-CM | POA: Diagnosis not present

## 2021-07-29 DIAGNOSIS — K219 Gastro-esophageal reflux disease without esophagitis: Secondary | ICD-10-CM | POA: Diagnosis not present

## 2021-07-29 DIAGNOSIS — N186 End stage renal disease: Secondary | ICD-10-CM | POA: Diagnosis not present

## 2021-07-30 ENCOUNTER — Encounter (HOSPITAL_COMMUNITY)
Admission: RE | Admit: 2021-07-30 | Discharge: 2021-07-30 | Disposition: A | Discharge status: Home / Self Care | Source: Ambulatory Visit | Attending: Vascular Surgery | Admitting: Vascular Surgery

## 2021-07-30 DIAGNOSIS — Z01818 Encounter for other preprocedural examination: Secondary | ICD-10-CM | POA: Diagnosis not present

## 2021-07-30 HISTORY — DX: Anxiety disorder, unspecified: F41.9

## 2021-07-30 HISTORY — DX: Unspecified asthma, uncomplicated: J45.909

## 2021-07-30 HISTORY — DX: Cardiac murmur, unspecified: R01.1

## 2021-07-30 NOTE — Pre-Procedure Instructions (Signed)
Preop instructions faxed to Fara [facility] at [phone].

## 2021-07-30 NOTE — Patient Instructions (Signed)
    William Flores  07/30/2021     @PREFPERIOPPHARMACY @   Your procedure is scheduled on  08/03/2021.    Report to [facility] at  0600  A.M.   Call this number if you have problems the morning of surgery:  [phone]   Remember:  Do not eat or drink after midnight.           Per Dr Luther Parody note in OR special needs, patient should have his last doe of coumadin on 07/30/2021.               Take 4 units of your night time insulin the night before your procedure.               DO NOT take any medications for diabetes the morning of your procedure.     Take these medicines the morning of surgery with A SIP OF WATER                                    allopurinol, synthroid, protonix, tramadol or morphine (if needed).      Do not wear jewelry, make-up or nail polish.  Do not wear lotions, powders, or perfumes, or deodorant.  Do not shave 48 hours prior to surgery.  Men may shave face and neck.  Do not bring valuables to the hospital.  [facility] is not responsible for any belongings or valuables.  Contacts, dentures or bridgework may not be worn into surgery.  Leave your suitcase in the car.  After surgery it may be brought to your room.  For patients admitted to the hospital, discharge time will be determined by your treatment team.  Patients discharged the day of surgery will not be allowed to drive home.    Special instructions:   DO NOT smoke tobacco or vape for 24 hours before your procedure.  Please read over the following fact sheets that you were given. Care and Recovery After Surgery

## 2021-07-31 DIAGNOSIS — N250 Renal osteodystrophy: Secondary | ICD-10-CM | POA: Diagnosis not present

## 2021-07-31 DIAGNOSIS — D509 Iron deficiency anemia, unspecified: Secondary | ICD-10-CM | POA: Diagnosis not present

## 2021-07-31 DIAGNOSIS — Z992 Dependence on renal dialysis: Secondary | ICD-10-CM | POA: Diagnosis not present

## 2021-07-31 DIAGNOSIS — N186 End stage renal disease: Secondary | ICD-10-CM | POA: Diagnosis not present

## 2021-08-02 ENCOUNTER — Encounter (HOSPITAL_COMMUNITY): Payer: Self-pay

## 2021-08-02 DIAGNOSIS — K219 Gastro-esophageal reflux disease without esophagitis: Secondary | ICD-10-CM | POA: Diagnosis not present

## 2021-08-02 DIAGNOSIS — D649 Anemia, unspecified: Secondary | ICD-10-CM | POA: Diagnosis not present

## 2021-08-02 DIAGNOSIS — I50810 Right heart failure, unspecified: Secondary | ICD-10-CM | POA: Diagnosis not present

## 2021-08-02 DIAGNOSIS — N186 End stage renal disease: Secondary | ICD-10-CM | POA: Diagnosis not present

## 2021-08-02 DIAGNOSIS — K7581 Nonalcoholic steatohepatitis (NASH): Secondary | ICD-10-CM | POA: Diagnosis not present

## 2021-08-02 DIAGNOSIS — Z992 Dependence on renal dialysis: Secondary | ICD-10-CM | POA: Diagnosis not present

## 2021-08-02 DIAGNOSIS — D696 Thrombocytopenia, unspecified: Secondary | ICD-10-CM | POA: Diagnosis not present

## 2021-08-03 ENCOUNTER — Other Ambulatory Visit: Payer: Self-pay

## 2021-08-03 ENCOUNTER — Encounter (HOSPITAL_COMMUNITY): Payer: Self-pay | Admitting: Vascular Surgery

## 2021-08-03 ENCOUNTER — Encounter (HOSPITAL_COMMUNITY)
Admission: RE | Disposition: A | Discharge status: Skilled Nursing Facility | Payer: Self-pay | Source: Ambulatory Visit | Attending: Vascular Surgery

## 2021-08-03 ENCOUNTER — Ambulatory Visit (HOSPITAL_COMMUNITY)
Admission: RE | Admit: 2021-08-03 | Discharge: 2021-08-03 | Disposition: A | Discharge status: Skilled Nursing Facility | Payer: Medicare Other | Source: Ambulatory Visit | Attending: Vascular Surgery | Admitting: Vascular Surgery

## 2021-08-03 ENCOUNTER — Ambulatory Visit (HOSPITAL_BASED_OUTPATIENT_CLINIC_OR_DEPARTMENT_OTHER): Payer: Medicare Other | Admitting: Certified Registered"

## 2021-08-03 ENCOUNTER — Ambulatory Visit (HOSPITAL_COMMUNITY): Payer: Medicare Other | Admitting: Certified Registered"

## 2021-08-03 DIAGNOSIS — I120 Hypertensive chronic kidney disease with stage 5 chronic kidney disease or end stage renal disease: Secondary | ICD-10-CM | POA: Diagnosis not present

## 2021-08-03 DIAGNOSIS — I4819 Other persistent atrial fibrillation: Secondary | ICD-10-CM | POA: Diagnosis not present

## 2021-08-03 DIAGNOSIS — D509 Iron deficiency anemia, unspecified: Secondary | ICD-10-CM | POA: Diagnosis not present

## 2021-08-03 DIAGNOSIS — Z87891 Personal history of nicotine dependence: Secondary | ICD-10-CM

## 2021-08-03 DIAGNOSIS — I509 Heart failure, unspecified: Secondary | ICD-10-CM | POA: Insufficient documentation

## 2021-08-03 DIAGNOSIS — K7460 Unspecified cirrhosis of liver: Secondary | ICD-10-CM | POA: Diagnosis not present

## 2021-08-03 DIAGNOSIS — J45909 Unspecified asthma, uncomplicated: Secondary | ICD-10-CM | POA: Diagnosis present

## 2021-08-03 DIAGNOSIS — Z515 Encounter for palliative care: Secondary | ICD-10-CM | POA: Diagnosis not present

## 2021-08-03 DIAGNOSIS — E2740 Unspecified adrenocortical insufficiency: Secondary | ICD-10-CM | POA: Diagnosis not present

## 2021-08-03 DIAGNOSIS — Z6841 Body Mass Index (BMI) 40.0 and over, adult: Secondary | ICD-10-CM | POA: Diagnosis not present

## 2021-08-03 DIAGNOSIS — I9589 Other hypotension: Secondary | ICD-10-CM | POA: Diagnosis not present

## 2021-08-03 DIAGNOSIS — G4730 Sleep apnea, unspecified: Secondary | ICD-10-CM | POA: Insufficient documentation

## 2021-08-03 DIAGNOSIS — I5032 Chronic diastolic (congestive) heart failure: Secondary | ICD-10-CM | POA: Diagnosis not present

## 2021-08-03 DIAGNOSIS — E1169 Type 2 diabetes mellitus with other specified complication: Secondary | ICD-10-CM | POA: Diagnosis not present

## 2021-08-03 DIAGNOSIS — E039 Hypothyroidism, unspecified: Secondary | ICD-10-CM | POA: Insufficient documentation

## 2021-08-03 DIAGNOSIS — E1122 Type 2 diabetes mellitus with diabetic chronic kidney disease: Secondary | ICD-10-CM | POA: Insufficient documentation

## 2021-08-03 DIAGNOSIS — Z79899 Other long term (current) drug therapy: Secondary | ICD-10-CM | POA: Insufficient documentation

## 2021-08-03 DIAGNOSIS — R1084 Generalized abdominal pain: Secondary | ICD-10-CM | POA: Diagnosis not present

## 2021-08-03 DIAGNOSIS — D649 Anemia, unspecified: Secondary | ICD-10-CM | POA: Insufficient documentation

## 2021-08-03 DIAGNOSIS — Z992 Dependence on renal dialysis: Secondary | ICD-10-CM | POA: Diagnosis not present

## 2021-08-03 DIAGNOSIS — N390 Urinary tract infection, site not specified: Secondary | ICD-10-CM | POA: Diagnosis not present

## 2021-08-03 DIAGNOSIS — I272 Pulmonary hypertension, unspecified: Secondary | ICD-10-CM | POA: Diagnosis not present

## 2021-08-03 DIAGNOSIS — I213 ST elevation (STEMI) myocardial infarction of unspecified site: Secondary | ICD-10-CM | POA: Diagnosis not present

## 2021-08-03 DIAGNOSIS — J9611 Chronic respiratory failure with hypoxia: Secondary | ICD-10-CM | POA: Diagnosis present

## 2021-08-03 DIAGNOSIS — N185 Chronic kidney disease, stage 5: Secondary | ICD-10-CM | POA: Diagnosis not present

## 2021-08-03 DIAGNOSIS — I132 Hypertensive heart and chronic kidney disease with heart failure and with stage 5 chronic kidney disease, or end stage renal disease: Secondary | ICD-10-CM | POA: Insufficient documentation

## 2021-08-03 DIAGNOSIS — Z794 Long term (current) use of insulin: Secondary | ICD-10-CM

## 2021-08-03 DIAGNOSIS — I4891 Unspecified atrial fibrillation: Secondary | ICD-10-CM | POA: Diagnosis not present

## 2021-08-03 DIAGNOSIS — R079 Chest pain, unspecified: Secondary | ICD-10-CM | POA: Diagnosis not present

## 2021-08-03 DIAGNOSIS — N186 End stage renal disease: Secondary | ICD-10-CM | POA: Insufficient documentation

## 2021-08-03 DIAGNOSIS — I2510 Atherosclerotic heart disease of native coronary artery without angina pectoris: Secondary | ICD-10-CM | POA: Diagnosis not present

## 2021-08-03 DIAGNOSIS — K7581 Nonalcoholic steatohepatitis (NASH): Secondary | ICD-10-CM | POA: Diagnosis not present

## 2021-08-03 DIAGNOSIS — I214 Non-ST elevation (NSTEMI) myocardial infarction: Secondary | ICD-10-CM | POA: Diagnosis not present

## 2021-08-03 DIAGNOSIS — Z7401 Bed confinement status: Secondary | ICD-10-CM | POA: Diagnosis not present

## 2021-08-03 DIAGNOSIS — T82818A Embolism of vascular prosthetic devices, implants and grafts, initial encounter: Secondary | ICD-10-CM | POA: Diagnosis present

## 2021-08-03 DIAGNOSIS — D696 Thrombocytopenia, unspecified: Secondary | ICD-10-CM | POA: Diagnosis not present

## 2021-08-03 DIAGNOSIS — Z7189 Other specified counseling: Secondary | ICD-10-CM | POA: Diagnosis not present

## 2021-08-03 DIAGNOSIS — K219 Gastro-esophageal reflux disease without esophagitis: Secondary | ICD-10-CM | POA: Diagnosis not present

## 2021-08-03 DIAGNOSIS — Z66 Do not resuscitate: Secondary | ICD-10-CM | POA: Diagnosis not present

## 2021-08-03 DIAGNOSIS — K7682 Hepatic encephalopathy: Secondary | ICD-10-CM | POA: Diagnosis not present

## 2021-08-03 DIAGNOSIS — N250 Renal osteodystrophy: Secondary | ICD-10-CM | POA: Diagnosis not present

## 2021-08-03 DIAGNOSIS — Y92239 Unspecified place in hospital as the place of occurrence of the external cause: Secondary | ICD-10-CM | POA: Diagnosis not present

## 2021-08-03 DIAGNOSIS — I50810 Right heart failure, unspecified: Secondary | ICD-10-CM | POA: Diagnosis not present

## 2021-08-03 DIAGNOSIS — I270 Primary pulmonary hypertension: Secondary | ICD-10-CM | POA: Diagnosis not present

## 2021-08-03 DIAGNOSIS — I959 Hypotension, unspecified: Secondary | ICD-10-CM | POA: Diagnosis not present

## 2021-08-03 DIAGNOSIS — R0689 Other abnormalities of breathing: Secondary | ICD-10-CM | POA: Diagnosis not present

## 2021-08-03 DIAGNOSIS — R000 Tachycardia, unspecified: Secondary | ICD-10-CM | POA: Diagnosis not present

## 2021-08-03 DIAGNOSIS — I10 Essential (primary) hypertension: Secondary | ICD-10-CM | POA: Diagnosis not present

## 2021-08-03 DIAGNOSIS — R109 Unspecified abdominal pain: Secondary | ICD-10-CM | POA: Diagnosis not present

## 2021-08-03 DIAGNOSIS — R4182 Altered mental status, unspecified: Secondary | ICD-10-CM | POA: Diagnosis not present

## 2021-08-03 DIAGNOSIS — D849 Immunodeficiency, unspecified: Secondary | ICD-10-CM | POA: Diagnosis not present

## 2021-08-03 DIAGNOSIS — E11649 Type 2 diabetes mellitus with hypoglycemia without coma: Secondary | ICD-10-CM | POA: Diagnosis not present

## 2021-08-03 DIAGNOSIS — I5033 Acute on chronic diastolic (congestive) heart failure: Secondary | ICD-10-CM | POA: Diagnosis not present

## 2021-08-03 DIAGNOSIS — Y832 Surgical operation with anastomosis, bypass or graft as the cause of abnormal reaction of the patient, or of later complication, without mention of misadventure at the time of the procedure: Secondary | ICD-10-CM | POA: Diagnosis present

## 2021-08-03 DIAGNOSIS — E662 Morbid (severe) obesity with alveolar hypoventilation: Secondary | ICD-10-CM | POA: Diagnosis not present

## 2021-08-03 DIAGNOSIS — D631 Anemia in chronic kidney disease: Secondary | ICD-10-CM | POA: Diagnosis not present

## 2021-08-03 DIAGNOSIS — I953 Hypotension of hemodialysis: Secondary | ICD-10-CM | POA: Diagnosis not present

## 2021-08-03 HISTORY — PX: AV FISTULA PLACEMENT: SHX1204

## 2021-08-03 LAB — POCT I-STAT, CHEM 8
BUN: 56 mg/dL — ABNORMAL HIGH (ref 8–23)
Calcium, Ion: 1.17 mmol/L (ref 1.15–1.40)
Chloride: 100 mmol/L (ref 98–111)
Creatinine, Ser: 7.1 mg/dL — ABNORMAL HIGH (ref 0.61–1.24)
Glucose, Bld: 101 mg/dL — ABNORMAL HIGH (ref 70–99)
HCT: 43 % (ref 39.0–52.0)
Hemoglobin: 14.6 g/dL (ref 13.0–17.0)
Potassium: 4.1 mmol/L (ref 3.5–5.1)
Sodium: 137 mmol/L (ref 135–145)
TCO2: 25 mmol/L (ref 22–32)

## 2021-08-03 LAB — GLUCOSE, CAPILLARY: Glucose-Capillary: 121 mg/dL — ABNORMAL HIGH (ref 70–99)

## 2021-08-03 SURGERY — INSERTION OF ARTERIOVENOUS (AV) GORE-TEX GRAFT ARM
Anesthesia: General | Site: Arm Upper | Laterality: Right

## 2021-08-03 MED ORDER — PROPOFOL 10 MG/ML IV BOLUS
INTRAVENOUS | Status: AC
  Filled 2021-08-03: qty 20

## 2021-08-03 MED ORDER — VASOPRESSIN 20 UNIT/ML IV SOLN
INTRAVENOUS | Status: DC | PRN
  Administered 2021-08-03 (×3): 1 [IU] via INTRAVENOUS

## 2021-08-03 MED ORDER — SODIUM CHLORIDE 0.9 % IV SOLN: INTRAVENOUS | Status: DC

## 2021-08-03 MED ORDER — OXYCODONE-ACETAMINOPHEN 5-325 MG PO TABS: 1.0000 | ORAL_TABLET | Freq: Four times a day (QID) | ORAL | 0 refills | Status: DC | PRN

## 2021-08-03 MED ORDER — CHLORHEXIDINE GLUCONATE 0.12 % MT SOLN
15.0000 mL | Freq: Once | OROMUCOSAL | Status: AC
  Administered 2021-08-03: 15 mL via OROMUCOSAL

## 2021-08-03 MED ORDER — SODIUM CHLORIDE 0.9 % IV SOLN: INTRAVENOUS | Status: DC | PRN

## 2021-08-03 MED ORDER — ORAL CARE MOUTH RINSE: 15.0000 mL | Freq: Once | OROMUCOSAL | Status: AC

## 2021-08-03 MED ORDER — VASOPRESSIN 20 UNIT/ML IV SOLN
INTRAVENOUS | Status: AC
  Filled 2021-08-03: qty 2

## 2021-08-03 MED ORDER — MIDAZOLAM HCL 2 MG/2ML IJ SOLN
INTRAMUSCULAR | Status: AC
  Filled 2021-08-03: qty 2

## 2021-08-03 MED ORDER — HEPARIN 6000 UNIT IRRIGATION SOLUTION
Status: DC | PRN
  Administered 2021-08-03: 1

## 2021-08-03 MED ORDER — HEPARIN SODIUM (PORCINE) 1000 UNIT/ML IJ SOLN
INTRAMUSCULAR | Status: AC
  Filled 2021-08-03: qty 6

## 2021-08-03 MED ORDER — CHLORHEXIDINE GLUCONATE 4 % EX LIQD: 60.0000 mL | Freq: Once | CUTANEOUS | Status: DC

## 2021-08-03 MED ORDER — MIDAZOLAM HCL 5 MG/5ML IJ SOLN
INTRAMUSCULAR | Status: DC | PRN
  Administered 2021-08-03 (×2): 1 mg via INTRAVENOUS

## 2021-08-03 MED ORDER — CEFAZOLIN SODIUM-DEXTROSE 2-4 GM/100ML-% IV SOLN
INTRAVENOUS | Status: AC
  Filled 2021-08-03: qty 100

## 2021-08-03 MED ORDER — FENTANYL CITRATE PF 50 MCG/ML IJ SOSY: 25.0000 ug | PREFILLED_SYRINGE | INTRAMUSCULAR | Status: DC | PRN

## 2021-08-03 MED ORDER — LIDOCAINE-EPINEPHRINE 0.5 %-1:200000 IJ SOLN
INTRAMUSCULAR | Status: DC | PRN
  Administered 2021-08-03: 14 mL

## 2021-08-03 MED ORDER — LACTATED RINGERS IV SOLN
INTRAVENOUS | Status: DC
Start: 2021-08-03 — End: 2021-08-03

## 2021-08-03 MED ORDER — ONDANSETRON HCL 4 MG/2ML IJ SOLN: 4.0000 mg | Freq: Once | INTRAMUSCULAR | Status: DC | PRN

## 2021-08-03 MED ORDER — CEFAZOLIN SODIUM-DEXTROSE 2-4 GM/100ML-% IV SOLN
2.0000 g | INTRAVENOUS | Status: AC
  Administered 2021-08-03: 2 g via INTRAVENOUS

## 2021-08-03 MED ORDER — ONDANSETRON HCL 4 MG/2ML IJ SOLN
INTRAMUSCULAR | Status: DC | PRN
  Administered 2021-08-03: 4 mg via INTRAVENOUS

## 2021-08-03 MED ORDER — 0.9 % SODIUM CHLORIDE (POUR BTL) OPTIME
TOPICAL | Status: DC | PRN
  Administered 2021-08-03: 1000 mL

## 2021-08-03 MED ORDER — PROPOFOL 500 MG/50ML IV EMUL
INTRAVENOUS | Status: DC | PRN
  Administered 2021-08-03: 25 ug/kg/min via INTRAVENOUS

## 2021-08-03 MED ORDER — PHENYLEPHRINE HCL (PRESSORS) 10 MG/ML IV SOLN
INTRAVENOUS | Status: DC | PRN
  Administered 2021-08-03: 80 ug via INTRAVENOUS
  Administered 2021-08-03: 50 ug via INTRAVENOUS
  Administered 2021-08-03 (×3): 80 ug via INTRAVENOUS
  Administered 2021-08-03 (×2): 100 ug via INTRAVENOUS
  Administered 2021-08-03 (×4): 80 ug via INTRAVENOUS
  Administered 2021-08-03: 50 ug via INTRAVENOUS

## 2021-08-03 MED ORDER — LIDOCAINE-EPINEPHRINE 0.5 %-1:200000 IJ SOLN
INTRAMUSCULAR | Status: AC
  Filled 2021-08-03: qty 1

## 2021-08-03 SURGICAL SUPPLY — 48 items
ADH SKN CLS APL DERMABOND .7 (GAUZE/BANDAGES/DRESSINGS) ×1
ARMBAND PINK RESTRICT EXTREMIT (MISCELLANEOUS) ×2 IMPLANT
BAG HAMPER (MISCELLANEOUS) ×2 IMPLANT
BNDG GAUZE DERMACEA FLUFF (GAUZE/BANDAGES/DRESSINGS) ×1
BNDG GAUZE DERMACEA FLUFF 4 (GAUZE/BANDAGES/DRESSINGS) IMPLANT
BNDG GAUZE ROLL STR 2.25X3YD (GAUZE/BANDAGES/DRESSINGS) ×1 IMPLANT
BNDG GZE DERMACEA 4 6PLY (GAUZE/BANDAGES/DRESSINGS) ×1
BNDG GZE SM 3X2.25 6 PLY (GAUZE/BANDAGES/DRESSINGS) ×1
CANNULA VESSEL 3MM 2 BLNT TIP (CANNULA) ×2 IMPLANT
CLIP LIGATING EXTRA MED SLVR (CLIP) ×2 IMPLANT
CLIP LIGATING EXTRA SM BLUE (MISCELLANEOUS) ×2 IMPLANT
COVER LIGHT HANDLE STERIS (MISCELLANEOUS) ×4 IMPLANT
COVER MAYO STAND XLG (MISCELLANEOUS) ×2 IMPLANT
DERMABOND ADVANCED (GAUZE/BANDAGES/DRESSINGS) ×1
DERMABOND ADVANCED .7 DNX12 (GAUZE/BANDAGES/DRESSINGS) ×1 IMPLANT
ELECT REM PT RETURN 9FT ADLT (ELECTROSURGICAL) ×2
ELECTRODE REM PT RTRN 9FT ADLT (ELECTROSURGICAL) ×1 IMPLANT
GAUZE SPONGE 4X4 12PLY STRL (GAUZE/BANDAGES/DRESSINGS) ×4 IMPLANT
GAUZE XEROFORM 5X9 LF (GAUZE/BANDAGES/DRESSINGS) ×1 IMPLANT
GLOVE BIOGEL PI IND STRL 7.0 (GLOVE) ×2 IMPLANT
GLOVE BIOGEL PI INDICATOR 7.0 (GLOVE) ×2
GLOVE SURG MICRO LTX SZ7.5 (GLOVE) ×2 IMPLANT
GOWN STRL REUS W/TWL LRG LVL3 (GOWN DISPOSABLE) ×6 IMPLANT
GRAFT GORETEX STRT 4-7X45 (Vascular Products) ×1 IMPLANT
IV NS 500ML (IV SOLUTION) ×4
IV NS 500ML BAXH (IV SOLUTION) ×2 IMPLANT
KIT BLADEGUARD II DBL (SET/KITS/TRAYS/PACK) ×2 IMPLANT
KIT TURNOVER KIT A (KITS) ×2 IMPLANT
MANIFOLD NEPTUNE II (INSTRUMENTS) ×2 IMPLANT
MARKER SKIN DUAL TIP RULER LAB (MISCELLANEOUS) ×4 IMPLANT
NDL HYPO 18GX1.5 BLUNT FILL (NEEDLE) ×1 IMPLANT
NEEDLE HYPO 18GX1.5 BLUNT FILL (NEEDLE) ×2 IMPLANT
NS IRRIG 1000ML POUR BTL (IV SOLUTION) ×2 IMPLANT
PACK CV ACCESS (CUSTOM PROCEDURE TRAY) ×2 IMPLANT
PAD ARMBOARD 7.5X6 YLW CONV (MISCELLANEOUS) ×2 IMPLANT
SET BASIN LINEN APH (SET/KITS/TRAYS/PACK) ×2 IMPLANT
SOL PREP POV-IOD 4OZ 10% (MISCELLANEOUS) ×2 IMPLANT
SOL PREP PROV IODINE SCRUB 4OZ (MISCELLANEOUS) ×2 IMPLANT
SPONGE T-LAP 18X18 ~~LOC~~+RFID (SPONGE) ×2 IMPLANT
SUT PROLENE 6 0 CC (SUTURE) ×4 IMPLANT
SUT SILK 2 0 FSL 18 (SUTURE) ×2 IMPLANT
SUT VIC AB 3-0 SH 27 (SUTURE) ×2
SUT VIC AB 3-0 SH 27X BRD (SUTURE) ×1 IMPLANT
SYR 10ML LL (SYRINGE) ×2 IMPLANT
SYR 50ML LL SCALE MARK (SYRINGE) ×2 IMPLANT
SYR CONTROL 10ML LL (SYRINGE) ×2 IMPLANT
SYR TOOMEY 50ML (SYRINGE) ×1 IMPLANT
UNDERPAD 30X36 HEAVY ABSORB (UNDERPADS AND DIAPERS) ×2 IMPLANT

## 2021-08-03 NOTE — Op Note (Signed)
    OPERATIVE REPORT  DATE OF SURGERY: 08/03/2021  PATIENT: William Flores, 71 y.o. male MRN: [id number]  DOB: [birth year]  PRE-OPERATIVE DIAGNOSIS: End-stage renal disease  POST-OPERATIVE DIAGNOSIS:  Same  PROCEDURE: Right upper arm AV Gore-Tex graft  SURGEON:  Curt Jews, M.D.  PHYSICIAN ASSISTANT: Fulton Mole, RNFA  The assistant was needed for exposure and to expedite the case  ANESTHESIA: MAC  EBL: per anesthesia record  Total I/O In: 100 [IV Piggyback:100] Out: 5 [Blood:5]  BLOOD ADMINISTERED: none  DRAINS: none  SPECIMEN: none  COUNTS CORRECT:  YES  PATIENT DISPOSITION:  PACU - hemodynamically stable  PROCEDURE DETAILS: The patient was taken to the operating placed supine position where the area of the right arm right axilla were prepped and draped in usual sterile fashion.  Using local anesthesia, incision was made over the brachial artery pulse at the antecubital space and the axillary vein in the axilla.  The artery was of normal caliber.  There was extensive atherosclerotic change.  The vein was of good caliber.  A tunnel was created in the subcutaneous tissue between the antecubital incision and the axillary incision.  A 4 x 7 mm tapered Gore-Tex graft was brought through the tunnel.  The brachial artery was occluded proximally distally and was opened with an 11 blade and sent longitudinally with Potts scissors.  The 4 mm portion of the graft was spatulated at approximately the 5 mm diameter level and was sewn end-to-side to the artery with a running 6-0 Prolene suture.  This anastomosis was tested and found to be adequate.  The graft was flushed with heparinized saline and reoccluded.  The axillary vein was occluded proximally and distally and was opened with an 11 blade and extended longitudinally with Potts scissors.  The graft was cut to the appropriate length and was spatulated and sewn end-to-side to the vein with a running 6-0 Prolene suture.  Clamps were  removed and good thrill was noted.  The wounds were irrigated with saline.  Hemostasis was obtained with electrocautery.  The wounds were closed with 3-0 Vicryl in the subcutaneous and subcuticular tissue.  Sterile dressing was applied and the patient was transferred to the recovery room in stable condition   [name], M.D., [facility] 08/03/2021 9:33 AM  Note: Portions of this report may have been transcribed using voice recognition software.  Every effort has been made to ensure accuracy; however, inadvertent computerized transcription errors may still be present.

## 2021-08-03 NOTE — Transfer of Care (Signed)
Immediate Anesthesia Transfer of Care Note  Patient: William Flores  Procedure(s) Performed: INSERTION OF RIGHT UPPER ARM ARTERIOVENOUS (AV) GORE-TEX GRAFT (Right: Arm Upper)  Patient Location: PACU  Anesthesia Type:MAC  Level of Consciousness: awake, sedated, drowsy and responds to stimulation  Airway & Oxygen Therapy: Patient Spontanous Breathing and Patient connected to face mask oxygen  Post-op Assessment: Report given to RN, Post -op Vital signs reviewed and stable and VS as preop, pt responds to name, and grimmaces to jaw thrust. MDA aware of intraoperative course and status arriving in PACU.  Post vital signs: Reviewed  Last Vitals:  Vitals Value Taken Time  BP 81/58 08/03/21 0930  Temp    Pulse 103 08/03/21 0933  Resp 12 08/03/21 0934  SpO2 96 % 08/03/21 0933  Vitals shown include unvalidated device data. Pt. VS as preop. BS 121.  Last Pain:  Vitals:   08/03/21 0714  TempSrc: Oral  PainSc: 0-No pain      Patients Stated Pain Goal: 5 (89/37/34 2876)  Complications: No notable events documented.

## 2021-08-03 NOTE — Discharge Instructions (Signed)
[facility]  Discharge Instructions  AV Fistula or Graft Surgery for Dialysis Access  Please refer to the following instructions for your post-procedure care. Your surgeon or physician assistant will discuss any changes with you.  Activity  You may drive the day following your surgery, if you are comfortable and no longer taking prescription pain medication. Resume full activity as the soreness in your incision resolves.  Bathing/Showering  You may shower after you go home. Keep your incision dry for 48 hours. Do not soak in a bathtub, hot tub, or swim until the incision heals completely. You may not shower if you have a hemodialysis catheter.  Incision Care  Clean your incision with mild soap and water after 48 hours. Pat the area dry with a clean towel. You do not need a bandage unless otherwise instructed. Do not apply any ointments or creams to your incision. You may have skin glue on your incision. Do not peel it off. It will come off on its own in about one week. Your arm may swell a bit after surgery. To reduce swelling use pillows to elevate your arm so it is above your heart. Your doctor will tell you if you need to lightly wrap your arm with an ACE bandage.  Diet  Resume your normal diet. There are not special food restrictions following this procedure. In order to heal from your surgery, it is CRITICAL to get adequate nutrition. Your body requires vitamins, minerals, and protein. Vegetables are the best source of vitamins and minerals. Vegetables also provide the perfect balance of protein. Processed food has little nutritional value, so try to avoid this.  Medications  Resume taking all of your medications. If your incision is causing pain, you may take over-the counter pain relievers such as acetaminophen (Tylenol). If you were prescribed a stronger pain medication, please be aware these medications can cause nausea and constipation. Prevent  nausea by taking the medication with a snack or meal. Avoid constipation by drinking plenty of fluids and eating foods with high amount of fiber, such as fruits, vegetables, and grains.  Do not take Tylenol if you are taking prescription pain medications.  Follow up Your surgeon may want to see you in the office following your access surgery. If so, this will be arranged at the time of your surgery.  Please call us immediately for any of the following conditions:  Increased pain, redness, drainage (pus) from your incision site Fever of 101 degrees or higher Severe or worsening pain at your incision site Hand pain or numbness.  Reduce your risk of vascular disease:  Stop smoking. If you would like help, call [facility] at 1-800-QUIT-NOW 856-557-3772) or [facility] at [place] your cholesterol Maintain a desired weight Control your diabetes Keep your blood pressure down  Dialysis  It will take several weeks to several months for your new dialysis access to be ready for use. Your surgeon will determine when it is okay to use it. Your nephrologist will continue to direct your dialysis. You can continue to use your Permcath until your new access is ready for use.   08/03/2021 William Flores [id number] 1950/10/02  Surgeon(s): Viveka Wilmeth, Arvilla Meres, MD  Procedure(s): INSERTION OF RIGHT UPPER ARM ARTERIOVENOUS (AV) GORE-TEX GRAFT   May stick graft immediately   May stick graft on designated area only:    Do not stick graft for 4 weeks    If you have any questions, please call the  office at [phone].

## 2021-08-03 NOTE — H&P (Signed)
Office Visit  06/30/2021 Vascular Vein Specialist-  Keshonna Valvo, Arvilla Meres, MD Vascular Surgery ESRD on dialysis [facility]) Dx Follow-up ; Referred by [name], [name], MD Reason for Visit   Additional Documentation  Vitals:  BP 102/67 (BP Location: Left Arm, Patient Position: Sitting, Cuff Size: Normal) Pulse 79 Temp 97.7 F (36.5 C) (Temporal) Resp 14 Ht 5' 4"  (1.626 m) Wt 108.8 kg SpO2 100% BMI 41.16 kg/m BSA 2.22 m Pain [place] 0-No pain  More Vitals  Flowsheets:  Vital Signs, NEWS, MEWS Score, Anthropometrics, Clinical Intake, Method of Visit   Encounter Info:  Billing Info, History, Allergies, Detailed Report    All Notes   Progress Notes by [name], MD at 06/30/2021 9:30 AM  Author: [name], MD Author Type: Physician Filed: 06/30/2021  1:39 PM  Note Status: Signed Cosign: Cosign Not Required Encounter Date: 06/30/2021  Editor: [name], MD (Physician)                                                    Vascular and Vein Specialist of [place]   Patient name: William Flores           MRN: [id number]        DOB: 19-Feb-1950          Sex: male   REASON FOR VISIT: Follow-up right arm access   HPI: William Flores is a 71 y.o. male here today for follow-up of his right arm access.  He had initial placement of a right brachiocephalic fistula by myself on 03/02/2021.  My last office visit with him was in 03/31/2021.  At that time he had a good Elton Catalano maturation of his fistula and felt to be able to use it at 3 months out.  He does continue to have dialysis via his catheter.  He was found at the dialysis center to no longer have fistula thrill or bruit and is here today for further evaluation.  He denies any steal symptoms.  He is left-handed.       Past Medical History:  Diagnosis Date   Anemia     CAD (coronary artery disease)      LHC 2/08:  mRCA 50%, o/w no sig CAD, EF 25%   Cardiomyopathy ([place])      EF 35-40% in 2/16 in setting of AF with  RVR >> echo 5/16 with improved LVF with EF 65-70%   CHF (congestive heart failure) (HCC)     Chronic atrial fibrillation (HCC)     Chronic diastolic heart failure (HCC)      HFPF - EF ~65-70% (OFTEN EXACERBATED BY AFIB)   Cirrhosis of liver (HCC)     CKD (chronic kidney disease)      hx of worsening renal failure and hyperK+ in setting of acute diast CHF >> required CVVHD in 4/16 and dialysis x 1 in 5/16   CKD stage 3 due to type 2 diabetes mellitus ([place]) 09/17/2015   Colon cancer ([place])     Colon polyps 09/21/2012    Tubular adenoma   Diabetes (HCC)     GERD (gastroesophageal reflux disease)     Hyperlipidemia     Hypertension     Hypothyroidism     Insomnia     Nonalcoholic steatohepatitis     Obesity hypoventilation syndrome ([place])  Pulmonary hypertension (HCC)     Renal insufficiency     Sleep apnea     Thrombocytopenia (HCC)             Family History  Problem Relation Age of Onset   Breast cancer Mother     Diabetes Mother     Heart attack Father        SOCIAL HISTORY: Social History         Tobacco Use   Smoking status: Former      Types: Cigarettes      Quit date: 08/06/1983      Years since quitting: 37.9   Smokeless tobacco: Never  Substance Use Topics   Alcohol use: No           Allergies  Allergen Reactions   Codeine        Headache     Diclofenac     Ibuprofen     Indomethacin     Meloxicam     Naproxen              Current Outpatient Medications  Medication Sig Dispense Refill   acetaminophen (TYLENOL) 160 MG/5ML liquid Take 650 mg by mouth every 4 (four) hours as needed for fever.       allopurinol (ZYLOPRIM) 100 MG tablet Take 200 mg by mouth daily.       Amino Acids-Protein Hydrolys (PRO-STAT) LIQD Take 30 mLs by mouth daily.       atorvastatin (LIPITOR) 20 MG tablet TAKE 1 TABLET BY MOUTH EVERY DAY (Patient taking differently: Take 20 mg by mouth at bedtime.) 90 tablet 3   B Complex-C (B-COMPLEX WITH VITAMIN C) tablet Take 1 tablet  by mouth daily.       Cholecalciferol (VITAMIN D) 125 MCG (5000 UT) CAPS Take 5,000 Units by mouth daily.       ENULOSE 10 GM/15ML SOLN Take 20 g by mouth daily.       ferrous sulfate 325 (65 FE) MG tablet Take 325 mg by mouth daily with breakfast.       glucose blood (CONTOUR NEXT TEST) test strip Test BS TID Dx E11.29 300 strip 3   guaiFENesin (ROBITUSSIN) 100 MG/5ML liquid Take 5 mLs by mouth every 4 (four) hours as needed for cough or to loosen phlegm.       hydrocortisone (CORTEF) 10 MG tablet Take 1 tablet (10 mg total) by mouth 2 (two) times daily. 60 tablet 2   insulin aspart (NOVOLOG) 100 UNIT/ML injection Before each meal 3 times a day, 140-199 - 2 units, 200-250 - 4 units, 251-299 - 6 units,  300-349 - 8 units,  350 or above 10 units. Insulin PEN if approved, provide syringes and needles if needed. 10 mL 0   insulin detemir (LEVEMIR) 100 UNIT/ML injection Inject 0.08 mLs (8 Units total) into the skin at bedtime. 10 mL 11   Insulin Pen Needle 32G X 4 MM MISC Use to give insulin daily Dx E11.29 100 each 3   levothyroxine (SYNTHROID) 100 MCG tablet Take 100 mcg by mouth every morning.       melatonin 5 MG TABS Take 5 mg by mouth at bedtime as needed (insomnia).       midodrine (PROAMATINE) 10 MG tablet Take 1 tablet (10 mg total) by mouth 3 (three) times daily with meals. 90 tablet 3   pantoprazole (PROTONIX) 40 MG tablet Take 1 tablet (40 mg total) by mouth 2 (two) times daily. 60 tablet 1  polyethylene glycol (MIRALAX / GLYCOLAX) 17 g packet Take 17 g by mouth daily. 14 each 0   rifaximin (XIFAXAN) 550 MG TABS tablet Take 1 tablet (550 mg total) by mouth 2 (two) times daily. 60 tablet 1   sildenafil (REVATIO) 20 MG tablet Take 1 tablet (20 mg total) by mouth 3 (three) times daily. 90 tablet 11   traMADol (ULTRAM) 50 MG tablet Take 1 tablet (50 mg total) by mouth every 8 (eight) hours as needed for severe pain. 15 tablet 0    No current facility-administered medications for this visit.              Facility-Administered Medications Ordered in Other Visits  Medication Dose Route Frequency Provider Last Rate Last Admin   0.9 %  sodium chloride infusion  100 mL Intravenous PRN Dwana Melena, MD       0.9 %  sodium chloride infusion  100 mL Intravenous PRN Dwana Melena, MD       alteplase (CATHFLO ACTIVASE) injection 2 mg  2 mg Intracatheter Once PRN Dwana Melena, MD       heparin injection 1,000 Units  1,000 Units Dialysis PRN Dwana Melena, MD       lidocaine (PF) (XYLOCAINE) 1 % injection 5 mL  5 mL Intradermal PRN Dwana Melena, MD       lidocaine-prilocaine (EMLA) cream 1 application.  1 application  Topical PRN Dwana Melena, MD       pentafluoroprop-tetrafluoroeth Landry Dyke) aerosol 1 application.  1 application  Topical PRN Dwana Melena, MD          REVIEW OF SYSTEMS:  [X]  denotes positive finding, [ ]  denotes negative finding Cardiac   Comments:  Chest pain or chest pressure:      Shortness of breath upon exertion:      Short of breath when lying flat:      Irregular heart rhythm:             Vascular      Pain in calf, thigh, or hip brought on by ambulation:      Pain in feet at night that wakes you up from your sleep:       Blood clot in your veins:      Leg swelling:                  PHYSICAL EXAM:    Vitals:    06/30/21 0913  BP: 102/67  Pulse: 79  Resp: 14  Temp: 97.7 F (36.5 C)  TempSrc: Temporal  SpO2: 100%  Weight: 239 lb 12.8 oz (108.8 kg)  Height: 5' 4"  (1.626 m)      GENERAL: The patient is a well-nourished male, in no acute distress. The vital signs are documented above. CARDIOVASCULAR: No palpable radial pulses bilaterally.  Well-healed antecubital incision on the right with no thrill or bruit present. PULMONARY: There is good air exchange  MUSCULOSKELETAL: There are no major deformities or cyanosis. NEUROLOGIC: No focal weakness or paresthesias are detected. SKIN: There are no ulcers or rashes noted. PSYCHIATRIC: The patient has a  normal affect.   DATA:  I imaged his fistula with SonoSite ultrasound.  His cephalic vein is patent above the anastomosis but is occluded for several centimeters along the anastomosis.  He does have a patent basilic vein.  He has multiple small branches near the antecubital space and a larger basilic vein in the mid upper arm   MEDICAL ISSUES:  I discussed options with the patient.  I have recommended a prosthetic graft rather than reattempting fistula creation.  Explained this would be available for use 1 month after placement.  He does have hemodialysis on Tuesday Thursday and Saturday so therefore would have to have a alteration of his normal hemodialysis today.  He is questioning continues to use his catheter only.  I explained that this does put him at significantly high risk for infection over time.  We will schedule new right upper arm AV Gore-Tex graft placement as an outpatient at [facility].  He is on chronic Coumadin therapy and will need to have this held for the appropriate time prior to surgery.  This is due to cardiac arrhythmia       [name], MD [facility] Vascular and Vein Specialists of [facility] [phone]   Note: Portions of this report may have been transcribed using voice recognition software.  Every effort has been made to ensure accuracy; however, inadvertent computerized transcription errors may still be present.      Addendum:  The patient has been re-examined and re-evaluated.  The patient's history and physical has been reviewed and is unchanged.    William Flores is a 71 y.o. male is being admitted with ESRD. All the risks, benefits and other treatment options have been discussed with the patient. The patient has consented to proceed with Procedure(s): INSERTION OF RIGHT UPPER ARM ARTERIOVENOUS (AV) GORE-TEX GRAFT as a surgical intervention.  Shelbi Vaccaro 08/03/2021 7:25 AM Vascular and Vein Surgery

## 2021-08-03 NOTE — Anesthesia Preprocedure Evaluation (Signed)
Anesthesia Evaluation  Patient identified by MRN, date of birth, ID band Patient awake    Reviewed: Allergy & Precautions, H&P , NPO status , Patient's Chart, lab work & pertinent test results, reviewed documented beta blocker date and time   Airway Mallampati: II  TM Distance: >3 FB Neck ROM: full    Dental no notable dental hx.    Pulmonary asthma , sleep apnea , former smoker,    Pulmonary exam normal breath sounds clear to auscultation       Cardiovascular Exercise Tolerance: Good hypertension, +CHF  + dysrhythmias Atrial Fibrillation  Rhythm:irregular Rate:Normal     Neuro/Psych negative neurological ROS  negative psych ROS   GI/Hepatic GERD  Medicated,(+) Hepatitis -  Endo/Other  diabetes, Type 2Hypothyroidism   Renal/GU ESRF and DialysisRenal disease  negative genitourinary   Musculoskeletal   Abdominal   Peds  Hematology  (+) Blood dyscrasia, anemia ,   Anesthesia Other Findings 1. Left ventricular ejection fraction, by estimation, is 60 to 65%. The  left ventricle has normal function. The left ventricle has no regional  wall motion abnormalities. Left ventricular diastolic parameters are  indeterminate.  2. Ventricular septum is flattened in systole consistent with RV pressure  overload. . Right ventricular systolic function is severely reduced. The  right ventricular size is severely enlarged. Tricuspid regurgitation  signal is inadequate for assessing PA  pressure.  3. Left atrial size was severely dilated.  4. Right atrial size was severely dilated.  5. The mitral valve is abnormal. Trivial mitral valve regurgitation. No  evidence of mitral stenosis. Moderate mitral annular calcification.  6. Tricuspid valve regurgitation not well visualized. not well visualized  tricuspid stenosis.  7. The aortic valve is tricuspid. There is moderate calcification of the  aortic valve. There is moderate  thickening of the aortic valve. Aortic  valve regurgitation is not visualized. No aortic stenosis is present.  8. The inferior vena cava is dilated in size with <50% respiratory  variability, suggesting right atrial pressure of 15 mmHg.   Reproductive/Obstetrics negative OB ROS                             Anesthesia Physical Anesthesia Plan  ASA: 3  Anesthesia Plan: General   Post-op Pain Management:    Induction:   PONV Risk Score and Plan:   Airway Management Planned:   Additional Equipment:   Intra-op Plan:   Post-operative Plan:   Informed Consent: I have reviewed the patients History and Physical, chart, labs and discussed the procedure including the risks, benefits and alternatives for the proposed anesthesia with the patient or authorized representative who has indicated his/her understanding and acceptance.     Dental Advisory Given  Plan Discussed with: CRNA  Anesthesia Plan Comments:         Anesthesia Quick Evaluation

## 2021-08-04 ENCOUNTER — Emergency Department (HOSPITAL_COMMUNITY): Payer: Medicare Other

## 2021-08-04 ENCOUNTER — Inpatient Hospital Stay (HOSPITAL_COMMUNITY)
Admission: EM | Admit: 2021-08-04 | Discharge: 2021-08-10 | DRG: 264 | Disposition: A | Discharge status: Skilled Nursing Facility | Payer: Medicare Other | Source: Skilled Nursing Facility | Attending: Internal Medicine | Admitting: Internal Medicine

## 2021-08-04 ENCOUNTER — Encounter (HOSPITAL_COMMUNITY): Payer: Self-pay | Admitting: Vascular Surgery

## 2021-08-04 ENCOUNTER — Other Ambulatory Visit: Payer: Self-pay

## 2021-08-04 ENCOUNTER — Encounter (HOSPITAL_COMMUNITY): Payer: Self-pay

## 2021-08-04 ENCOUNTER — Emergency Department (HOSPITAL_COMMUNITY): Admission: EM | Admit: 2021-08-04 | Payer: Medicare Other | Source: Home / Self Care

## 2021-08-04 DIAGNOSIS — Z881 Allergy status to other antibiotic agents status: Secondary | ICD-10-CM

## 2021-08-04 DIAGNOSIS — I4891 Unspecified atrial fibrillation: Secondary | ICD-10-CM | POA: Diagnosis not present

## 2021-08-04 DIAGNOSIS — E039 Hypothyroidism, unspecified: Secondary | ICD-10-CM | POA: Diagnosis present

## 2021-08-04 DIAGNOSIS — Z992 Dependence on renal dialysis: Secondary | ICD-10-CM

## 2021-08-04 DIAGNOSIS — Z87891 Personal history of nicotine dependence: Secondary | ICD-10-CM

## 2021-08-04 DIAGNOSIS — J45909 Unspecified asthma, uncomplicated: Secondary | ICD-10-CM | POA: Diagnosis present

## 2021-08-04 DIAGNOSIS — K7581 Nonalcoholic steatohepatitis (NASH): Secondary | ICD-10-CM | POA: Diagnosis present

## 2021-08-04 DIAGNOSIS — I2510 Atherosclerotic heart disease of native coronary artery without angina pectoris: Secondary | ICD-10-CM | POA: Diagnosis present

## 2021-08-04 DIAGNOSIS — Z7401 Bed confinement status: Secondary | ICD-10-CM | POA: Diagnosis not present

## 2021-08-04 DIAGNOSIS — I4819 Other persistent atrial fibrillation: Secondary | ICD-10-CM | POA: Diagnosis present

## 2021-08-04 DIAGNOSIS — Z66 Do not resuscitate: Secondary | ICD-10-CM | POA: Diagnosis not present

## 2021-08-04 DIAGNOSIS — D696 Thrombocytopenia, unspecified: Secondary | ICD-10-CM | POA: Diagnosis present

## 2021-08-04 DIAGNOSIS — Z85038 Personal history of other malignant neoplasm of large intestine: Secondary | ICD-10-CM

## 2021-08-04 DIAGNOSIS — N390 Urinary tract infection, site not specified: Secondary | ICD-10-CM

## 2021-08-04 DIAGNOSIS — E662 Morbid (severe) obesity with alveolar hypoventilation: Secondary | ICD-10-CM | POA: Diagnosis present

## 2021-08-04 DIAGNOSIS — I270 Primary pulmonary hypertension: Secondary | ICD-10-CM | POA: Diagnosis not present

## 2021-08-04 DIAGNOSIS — N186 End stage renal disease: Secondary | ICD-10-CM | POA: Diagnosis present

## 2021-08-04 DIAGNOSIS — Z6841 Body Mass Index (BMI) 40.0 and over, adult: Secondary | ICD-10-CM

## 2021-08-04 DIAGNOSIS — D849 Immunodeficiency, unspecified: Secondary | ICD-10-CM | POA: Diagnosis present

## 2021-08-04 DIAGNOSIS — R1084 Generalized abdominal pain: Secondary | ICD-10-CM | POA: Diagnosis not present

## 2021-08-04 DIAGNOSIS — K7460 Unspecified cirrhosis of liver: Secondary | ICD-10-CM | POA: Diagnosis present

## 2021-08-04 DIAGNOSIS — Z79899 Other long term (current) drug therapy: Secondary | ICD-10-CM

## 2021-08-04 DIAGNOSIS — I255 Ischemic cardiomyopathy: Secondary | ICD-10-CM | POA: Diagnosis present

## 2021-08-04 DIAGNOSIS — R8271 Bacteriuria: Secondary | ICD-10-CM | POA: Diagnosis present

## 2021-08-04 DIAGNOSIS — I132 Hypertensive heart and chronic kidney disease with heart failure and with stage 5 chronic kidney disease, or end stage renal disease: Secondary | ICD-10-CM | POA: Diagnosis present

## 2021-08-04 DIAGNOSIS — E1122 Type 2 diabetes mellitus with diabetic chronic kidney disease: Secondary | ICD-10-CM | POA: Diagnosis present

## 2021-08-04 DIAGNOSIS — Z515 Encounter for palliative care: Secondary | ICD-10-CM

## 2021-08-04 DIAGNOSIS — Z91128 Patient's intentional underdosing of medication regimen for other reason: Secondary | ICD-10-CM

## 2021-08-04 DIAGNOSIS — R197 Diarrhea, unspecified: Secondary | ICD-10-CM | POA: Diagnosis not present

## 2021-08-04 DIAGNOSIS — Z7901 Long term (current) use of anticoagulants: Secondary | ICD-10-CM

## 2021-08-04 DIAGNOSIS — M109 Gout, unspecified: Secondary | ICD-10-CM | POA: Diagnosis present

## 2021-08-04 DIAGNOSIS — Z86010 Personal history of colonic polyps: Secondary | ICD-10-CM

## 2021-08-04 DIAGNOSIS — E11649 Type 2 diabetes mellitus with hypoglycemia without coma: Secondary | ICD-10-CM | POA: Diagnosis not present

## 2021-08-04 DIAGNOSIS — Z833 Family history of diabetes mellitus: Secondary | ICD-10-CM

## 2021-08-04 DIAGNOSIS — I5033 Acute on chronic diastolic (congestive) heart failure: Secondary | ICD-10-CM | POA: Diagnosis present

## 2021-08-04 DIAGNOSIS — I213 ST elevation (STEMI) myocardial infarction of unspecified site: Principal | ICD-10-CM | POA: Diagnosis present

## 2021-08-04 DIAGNOSIS — I214 Non-ST elevation (NSTEMI) myocardial infarction: Principal | ICD-10-CM | POA: Diagnosis present

## 2021-08-04 DIAGNOSIS — Z7189 Other specified counseling: Secondary | ICD-10-CM | POA: Diagnosis not present

## 2021-08-04 DIAGNOSIS — E2740 Unspecified adrenocortical insufficiency: Secondary | ICD-10-CM | POA: Diagnosis not present

## 2021-08-04 DIAGNOSIS — Z8249 Family history of ischemic heart disease and other diseases of the circulatory system: Secondary | ICD-10-CM

## 2021-08-04 DIAGNOSIS — Y832 Surgical operation with anastomosis, bypass or graft as the cause of abnormal reaction of the patient, or of later complication, without mention of misadventure at the time of the procedure: Secondary | ICD-10-CM | POA: Diagnosis present

## 2021-08-04 DIAGNOSIS — D631 Anemia in chronic kidney disease: Secondary | ICD-10-CM | POA: Diagnosis present

## 2021-08-04 DIAGNOSIS — I272 Pulmonary hypertension, unspecified: Secondary | ICD-10-CM | POA: Diagnosis present

## 2021-08-04 DIAGNOSIS — K7682 Hepatic encephalopathy: Secondary | ICD-10-CM | POA: Diagnosis not present

## 2021-08-04 DIAGNOSIS — I953 Hypotension of hemodialysis: Secondary | ICD-10-CM | POA: Diagnosis not present

## 2021-08-04 DIAGNOSIS — R109 Unspecified abdominal pain: Secondary | ICD-10-CM | POA: Diagnosis not present

## 2021-08-04 DIAGNOSIS — F419 Anxiety disorder, unspecified: Secondary | ICD-10-CM | POA: Diagnosis present

## 2021-08-04 DIAGNOSIS — I120 Hypertensive chronic kidney disease with stage 5 chronic kidney disease or end stage renal disease: Secondary | ICD-10-CM | POA: Diagnosis not present

## 2021-08-04 DIAGNOSIS — I878 Other specified disorders of veins: Secondary | ICD-10-CM | POA: Diagnosis present

## 2021-08-04 DIAGNOSIS — J9611 Chronic respiratory failure with hypoxia: Secondary | ICD-10-CM | POA: Diagnosis present

## 2021-08-04 DIAGNOSIS — R000 Tachycardia, unspecified: Secondary | ICD-10-CM | POA: Diagnosis not present

## 2021-08-04 DIAGNOSIS — R079 Chest pain, unspecified: Secondary | ICD-10-CM | POA: Diagnosis not present

## 2021-08-04 DIAGNOSIS — Z87440 Personal history of urinary (tract) infections: Secondary | ICD-10-CM

## 2021-08-04 DIAGNOSIS — Z888 Allergy status to other drugs, medicaments and biological substances status: Secondary | ICD-10-CM

## 2021-08-04 DIAGNOSIS — R4182 Altered mental status, unspecified: Secondary | ICD-10-CM | POA: Diagnosis not present

## 2021-08-04 DIAGNOSIS — R0689 Other abnormalities of breathing: Secondary | ICD-10-CM | POA: Diagnosis not present

## 2021-08-04 DIAGNOSIS — Z7952 Long term (current) use of systemic steroids: Secondary | ICD-10-CM

## 2021-08-04 DIAGNOSIS — R682 Dry mouth, unspecified: Secondary | ICD-10-CM | POA: Diagnosis not present

## 2021-08-04 DIAGNOSIS — E785 Hyperlipidemia, unspecified: Secondary | ICD-10-CM | POA: Diagnosis present

## 2021-08-04 DIAGNOSIS — Z885 Allergy status to narcotic agent status: Secondary | ICD-10-CM

## 2021-08-04 DIAGNOSIS — Y92239 Unspecified place in hospital as the place of occurrence of the external cause: Secondary | ICD-10-CM | POA: Diagnosis not present

## 2021-08-04 DIAGNOSIS — Z79890 Hormone replacement therapy (postmenopausal): Secondary | ICD-10-CM

## 2021-08-04 DIAGNOSIS — T82818A Embolism of vascular prosthetic devices, implants and grafts, initial encounter: Secondary | ICD-10-CM | POA: Diagnosis present

## 2021-08-04 DIAGNOSIS — T366X6A Underdosing of rifampicins, initial encounter: Secondary | ICD-10-CM | POA: Diagnosis not present

## 2021-08-04 DIAGNOSIS — Z794 Long term (current) use of insulin: Secondary | ICD-10-CM

## 2021-08-04 DIAGNOSIS — N250 Renal osteodystrophy: Secondary | ICD-10-CM | POA: Diagnosis not present

## 2021-08-04 DIAGNOSIS — K219 Gastro-esophageal reflux disease without esophagitis: Secondary | ICD-10-CM | POA: Diagnosis present

## 2021-08-04 DIAGNOSIS — I9589 Other hypotension: Secondary | ICD-10-CM | POA: Diagnosis present

## 2021-08-04 DIAGNOSIS — Z793 Long term (current) use of hormonal contraceptives: Secondary | ICD-10-CM

## 2021-08-04 DIAGNOSIS — I959 Hypotension, unspecified: Secondary | ICD-10-CM | POA: Diagnosis not present

## 2021-08-04 DIAGNOSIS — Z803 Family history of malignant neoplasm of breast: Secondary | ICD-10-CM

## 2021-08-04 DIAGNOSIS — Z9981 Dependence on supplemental oxygen: Secondary | ICD-10-CM

## 2021-08-04 DIAGNOSIS — M898X9 Other specified disorders of bone, unspecified site: Secondary | ICD-10-CM | POA: Diagnosis present

## 2021-08-04 LAB — URINALYSIS, MICROSCOPIC (REFLEX)
RBC / HPF: >50 RBC/hpf (ref 0–5)
WBC, UA: >50 WBC/hpf (ref 0–5)

## 2021-08-04 LAB — URINALYSIS, ROUTINE W REFLEX MICROSCOPIC
Glucose, UA: 100 mg/dL — AB
Ketones, ur: 15 mg/dL — AB
Nitrite: POSITIVE — AB
Protein, ur: >300 mg/dL — AB
Specific Gravity, Urine: 1.025 (ref 1.005–1.030)
pH: 6.5 (ref 5.0–8.0)

## 2021-08-04 LAB — COMPREHENSIVE METABOLIC PANEL
ALT: 15 U/L (ref 0–44)
AST: 27 U/L (ref 15–41)
Albumin: 2.9 g/dL — ABNORMAL LOW (ref 3.5–5.0)
Alkaline Phosphatase: 69 U/L (ref 38–126)
Anion gap: 12 (ref 5–15)
BUN: 40 mg/dL — ABNORMAL HIGH (ref 8–23)
CO2: 22 mmol/L (ref 22–32)
Calcium: 9 mg/dL (ref 8.9–10.3)
Chloride: 98 mmol/L (ref 98–111)
Creatinine, Ser: 5.29 mg/dL — ABNORMAL HIGH (ref 0.61–1.24)
GFR, Estimated: 11 mL/min — ABNORMAL LOW (ref >60)
Glucose, Bld: 85 mg/dL (ref 70–99)
Potassium: 4.1 mmol/L (ref 3.5–5.1)
Sodium: 132 mmol/L — ABNORMAL LOW (ref 135–145)
Total Bilirubin: 1.1 mg/dL (ref 0.3–1.2)
Total Protein: 6.2 g/dL — ABNORMAL LOW (ref 6.5–8.1)

## 2021-08-04 LAB — TROPONIN I (HIGH SENSITIVITY)
Troponin I (High Sensitivity): 1082 ng/L (ref <18)
Troponin I (High Sensitivity): 1057 ng/L (ref <18)

## 2021-08-04 LAB — HEMOGLOBIN A1C
Hgb A1c MFr Bld: 5.4 % (ref 4.8–5.6)
Mean Plasma Glucose: 108.28 mg/dL

## 2021-08-04 LAB — MAGNESIUM: Magnesium: 2 mg/dL (ref 1.7–2.4)

## 2021-08-04 LAB — BRAIN NATRIURETIC PEPTIDE: B Natriuretic Peptide: 1412.6 pg/mL — ABNORMAL HIGH (ref 0.0–100.0)

## 2021-08-04 LAB — LIPASE, BLOOD: Lipase: 18 U/L (ref 11–51)

## 2021-08-04 MED ORDER — SODIUM CHLORIDE 0.9 % IV SOLN
1.0000 g | INTRAVENOUS | Status: DC
  Administered 2021-08-06: 1 g via INTRAVENOUS
  Filled 2021-08-04: qty 10

## 2021-08-04 MED ORDER — ASPIRIN 81 MG PO CHEW
324.0000 mg | CHEWABLE_TABLET | Freq: Once | ORAL | Status: AC
  Administered 2021-08-04: 324 mg via ORAL
  Filled 2021-08-04: qty 4

## 2021-08-04 MED ORDER — SODIUM CHLORIDE 0.9 % IV SOLN: 250.0000 mL | INTRAVENOUS | Status: DC | PRN

## 2021-08-04 MED ORDER — ATORVASTATIN CALCIUM 80 MG PO TABS
80.0000 mg | ORAL_TABLET | Freq: Every day | ORAL | Status: DC
  Administered 2021-08-05 – 2021-08-10 (×5): 80 mg via ORAL
  Filled 2021-08-04 (×2): qty 1
  Filled 2021-08-04: qty 2
  Filled 2021-08-04 (×3): qty 1
  Filled 2021-08-04: qty 2

## 2021-08-04 MED ORDER — ACETAMINOPHEN 325 MG PO TABS
650.0000 mg | ORAL_TABLET | ORAL | Status: DC | PRN
  Administered 2021-08-06 – 2021-08-07 (×2): 650 mg via ORAL
  Filled 2021-08-04 (×2): qty 2

## 2021-08-04 MED ORDER — SODIUM CHLORIDE 0.9 % IV SOLN: INTRAVENOUS | Status: DC

## 2021-08-04 MED ORDER — SODIUM CHLORIDE 0.9% FLUSH: 3.0000 mL | INTRAVENOUS | Status: DC | PRN

## 2021-08-04 MED ORDER — HEPARIN (PORCINE) 25000 UT/250ML-% IV SOLN
1650.0000 [IU]/h | INTRAVENOUS | Status: DC
  Administered 2021-08-04: 1000 [IU]/h via INTRAVENOUS
  Administered 2021-08-05: 1400 [IU]/h via INTRAVENOUS
  Administered 2021-08-06: 1600 [IU]/h via INTRAVENOUS
  Administered 2021-08-06 – 2021-08-07 (×2): 1650 [IU]/h via INTRAVENOUS
  Filled 2021-08-04 (×4): qty 250

## 2021-08-04 MED ORDER — ASPIRIN 81 MG PO CHEW
81.0000 mg | CHEWABLE_TABLET | ORAL | Status: AC
  Administered 2021-08-05: 81 mg via ORAL
  Filled 2021-08-04: qty 1

## 2021-08-04 MED ORDER — SODIUM CHLORIDE 0.9% FLUSH
3.0000 mL | Freq: Two times a day (BID) | INTRAVENOUS | Status: DC
Start: 2021-08-04 — End: 2021-08-05

## 2021-08-04 MED ORDER — SODIUM CHLORIDE 0.9 % IV SOLN
1.0000 g | Freq: Once | INTRAVENOUS | Status: AC
  Administered 2021-08-04: 1 g via INTRAVENOUS
  Filled 2021-08-04: qty 10

## 2021-08-04 MED ORDER — ONDANSETRON HCL 4 MG/2ML IJ SOLN
4.0000 mg | Freq: Four times a day (QID) | INTRAMUSCULAR | Status: DC | PRN
  Administered 2021-08-07 – 2021-08-08 (×2): 4 mg via INTRAVENOUS
  Filled 2021-08-04 (×3): qty 2

## 2021-08-04 MED ORDER — ASPIRIN 81 MG PO TBEC: 81.0000 mg | DELAYED_RELEASE_TABLET | Freq: Every day | ORAL | Status: DC

## 2021-08-04 MED ORDER — NITROGLYCERIN 0.4 MG SL SUBL: 0.4000 mg | SUBLINGUAL_TABLET | SUBLINGUAL | Status: DC | PRN

## 2021-08-04 NOTE — ED Provider Notes (Addendum)
Called by Dr Eulis Foster from [facility] ED regarding transfer for NSTEMI.   71 yo M with hx of ESRD on iHD (still makes urine) and CHF who presented with epigastric discomfort. Vital signs reassuring but is mildly tachycardic. Is on AC. Troponin 1,000 and they feel that he is having an NSTEMI. No prior LHC. Discussed with Dr Ali Lowe from cardiology who would like for hospitalist. Given ASA 324 mg. Starting heparin gtt. SBP 116 so holding on Nitro at this time.   Notified by Dr Eulis Foster that hospitalist has not evaluated the pt at time of transfer so they will need to be contacted by [facility] ED providers.   Fransico Meadow, MD 08/04/21 Carollee Massed      Fransico Meadow, MD 08/04/21 Drema Halon

## 2021-08-04 NOTE — H&P (Signed)
History and Physical    [name] PZW:[id number] DOB: [birth year] DOA: 08/04/2021  PCP: [name], [name], MD  Patient coming from: transfer from [facility]  I have personally briefly reviewed patient's old medical records in [place]  Chief Complaint: NSTEMI HPI: William Flores is a 71 y.o. male with medical history significant of  OSA, P afib on coumadin, NASH liver cirrhosis ,thrombocytopenia, with history of GI bleed, severe pulmonary HTN with hx of RV failure, chronic hypoxic respiratory failure on 2-3L home 02, DMII,ESRD TTS ,hypothyroidism,colon CA s/p resection, as well as  hx non obstructive CAD on cath 2008. Patient has interim history of occluded AVF that was noted at HD ,he was evaluated by vascular on 8/1 who noted AVF was occluded for several centimeters along the anastomosis with plans for prosthetic graft rather than reattempting fistula creation. Patient per vascular will continue to use his HD catheter for access. Patient now presents from [facility] with NSTEMI after presenting on 8/2 with complaint of abdominal and epigastric  pain/ chest discomfort with rising CE [id number]->[id number]-> [id number] with nonspecific EKG. Patient was evaluated by cardiology who recommended treatment for ACS and transfer to [facility] for further cardiac care and possible LHC. Per patient he notes intense heart burn as well abdominal pain that started last pm prior to admit. He notes no associated n/v/diaphoresis/sob/ palpitations or presyncope. He notes pain also similar to his usual symptoms of GERD was more persistent and intense. Patient notes no radiation of pain. Patient on ros also notes history of UTI.  Patient on arrival at [facility] was evaluated by cardiology who recommended medical treatment for ACS  as well as palliative care evaluation based on co morbidities with further recs to follow.  ED Course:  Afeb, bp 112/63,  hr 110, rr 23 sat 100% on 3L Labs NA 132, K 4.1 cr 5.29 Lipase 18 Hgb 11.3 base   11-10, prior 14.6 ( outlier) ptl 83 UA:  + nitrite , large LE,few bacteria  CE 417->413-> 1082 Cxr: IMPRESSION: Slight interval worsening in bilateral airspace disease compatible with edema. Probable small bilateral pleural effusions.  ERX:VQMG,QQP,Y wave inversion inferior leads , no hyperacute chagnes noted  Tx asa heparin Review of Systems: As per HPI otherwise 10 point review of systems negative.   Past Medical History:  Diagnosis Date   Anemia    Anxiety    Asthma    CAD (coronary artery disease)    LHC 2/08:  mRCA 50%, o/w no sig CAD, EF 25%   Cardiomyopathy ([place])    EF 35-40% in 2/16 in setting of AF with RVR >> echo 5/16 with improved LVF with EF 65-70%   CHF (congestive heart failure) (HCC)    Chronic atrial fibrillation (HCC)    Chronic diastolic heart failure (HCC)    HFPF - EF ~65-70% (OFTEN EXACERBATED BY AFIB)   Cirrhosis of liver (HCC)    CKD (chronic kidney disease)    hx of worsening renal failure and hyperK+ in setting of acute diast CHF >> required CVVHD in 4/16 and dialysis x 1 in 5/16   CKD stage 3 due to type 2 diabetes mellitus ([place]) 09/17/2015   Colon cancer ([place])    Colon polyps 09/21/2012   Tubular adenoma   Diabetes (HCC)    GERD (gastroesophageal reflux disease)    Heart murmur    Hyperlipidemia    Hypertension    Hypothyroidism    Insomnia    Nonalcoholic steatohepatitis  Obesity hypoventilation syndrome (HCC)    Pulmonary hypertension (HCC)    Renal insufficiency    Sleep apnea    Thrombocytopenia (HCC)     Past Surgical History:  Procedure Laterality Date   AV FISTULA PLACEMENT Right 03/02/2021   Procedure: RIGHT ARM ARTERIOVENOUS (AV) FISTULA CREATION;  Surgeon: [name], MD;  Location: [facility] ORS;  Service: Vascular;  Laterality: Right;   AV FISTULA PLACEMENT Right 08/03/2021   Procedure: INSERTION OF RIGHT UPPER ARM ARTERIOVENOUS (AV) GORE-TEX GRAFT;  Surgeon: [name], MD;  Location: [facility] ORS;  Service: Vascular;  Laterality:  Right;   CARDIAC ELECTROPHYSIOLOGY [place]  05/2021   CIRCUMCISION     COLON RESECTION     COLONOSCOPY N/A 09/21/2012   Procedure: COLONOSCOPY;  Surgeon: Inda Castle, MD;  Location: [facility] ENDOS[facility];  Service: Endoscopy;  Laterality: N/A;   COLONOSCOPY N/A 12/28/2018   Procedure: COLONOSCOPY;  Surgeon: Rogene Houston, MD;  Location: [facility] END[facility] SU[facility];  Service: Endoscopy;  Laterality: N/A;   COLONOSCOPY WITH PROPOFOL N/A 10/17/2020   Procedure: COLONOSCOPY WITH PROPOFOL;  Surgeon: Harvel Quale, MD;  Location: [facility] END[facility] SU[facility];  Service: Gastroenterology;  Laterality: N/A;   COLONOSCOPY WITH PROPOFOL N/A 04/25/2021   Procedure: COLONOSCOPY WITH PROPOFOL;  Surgeon: Rogene Houston, MD;  Location: [facility];  Service: Endoscopy;  Laterality: N/A;   ENTEROSCOPY N/A 06/24/2020   Procedure: ENTEROSCOPY;  Surgeon: Gatha Mayer, MD;  Location: [facility] ENDOSCOPY;  Service: Endoscopy;  Laterality: N/A;   ESOPHAGOGASTRODUODENOSCOPY N/A 12/28/2018   Procedure: ESOPHAGOGASTRODUODENOSCOPY (EGD);  Surgeon: Rogene Houston, MD;  Location[facility];  Service: Endoscopy;  Laterality: N/A;   HOT HEMOSTASIS  04/25/2021   Procedure: HOT HEMOSTASIS (ARGON PLASMA COAGULATION/BICAP);  Surgeon: Rogene Houston, MD;  Location: [facility];  Service: Endoscopy;;   IR FLUORO GUIDE CV LINE RIGHT  12/10/2020   IR US GUIDE [place] RIGHT  12/11/2020   POLYPECTOMY  10/17/2020   Procedure: POLYPECTOMY;  Surgeon: Harvel Quale, MD;  Location: [facility];  Service: Gastroenterology;;  rectal, transverse, descending, ascending, sigmoid   RIGHT HEART CATH N/A 04/11/2019   Procedure: RIGHT HEART CATH;  Surgeon: Larey Dresser, MD;  Location: [place] CV LAB;  Service: Cardiovascular;  Laterality: N/A;   US ECHOCARDIOGRAPHY  03/04/2010   abnormal study     reports that he quit smoking about 38 years ago. His smoking use included cigarettes. He has never used smokeless  tobacco. He reports that he does not drink alcohol and does not use drugs.  Allergies  Allergen Reactions   Codeine     Headache    Diclofenac    Ibuprofen    Indomethacin    Meloxicam    Naproxen     Family History  Problem Relation Age of Onset   Breast cancer Mother    Diabetes Mother    Heart attack Father     Prior to Admission medications   Medication Sig Start Date End Date Taking? Authorizing Provider  Allopurinol 200 MG TABS Take 200 mg by mouth daily.   Yes [provider]  atorvastatin (LIPITOR) 20 MG tablet TAKE 1 TABLET BY MOUTH EVERY DAY Patient taking differently: Take 20 mg by mouth every evening. 03/19/20  Yes [name], [name], MD  levothyroxine (SYNTHROID) 100 MCG tablet Take 100 mcg by mouth daily.   Yes [provider]  polyethylene glycol (MIRALAX / GLYCOLAX) 17 g packet Take 17 g  by mouth daily. 01/01/19  Yes Roxan Hockey, MD  acetaminophen (TYLENOL) 160 MG/5ML liquid Take 650 mg by mouth every 4 (four) hours as needed for fever.    [provider]  B Complex-C (B-COMPLEX WITH VITAMIN C) tablet Take 1 tablet by mouth daily. 12/12/20   Florencia Reasons, MD  Cholecalciferol (VITAMIN D) 125 MCG (5000 UT) CAPS Take 5,000 Units by mouth daily.    [provider]  ciprofloxacin (CIPRO) 500 MG tablet Take 1 tablet (500 mg total) by mouth daily. Patient not taking: Reported on 07/29/2021 07/01/21   Primus Bravo., MD  ENULOSE 10 GM/15ML SOLN Take 20 g by mouth daily. 04/13/21   [provider]  ferrous sulfate 325 (65 FE) MG tablet Take 325 mg by mouth daily.    [provider]  glucose blood (CONTOUR NEXT TEST) test strip Test BS TID Dx E11.29 05/22/20   [name], [name], MD  guaiFENesin (ROBITUSSIN) 100 MG/5ML liquid Take 5 mLs by mouth every 4 (four) hours as needed for cough or to loosen phlegm.    [provider]  hydrocortisone (CORTEF) 10 MG tablet Take 1 tablet (10 mg total) by mouth 2 (two)  times daily. Patient taking differently: Take 10 mg by mouth every 12 (twelve) hours. 05/16/21   Barton Dubois, MD  insulin aspart (NOVOLOG) 100 UNIT/ML injection Before each meal 3 times a day, 140-199 - 2 units, 200-250 - 4 units, 251-299 - 6 units,  300-349 - 8 units,  350 or above 10 units. Insulin PEN if approved, provide syringes and needles if needed. Patient taking differently: Before each meal 3 times a day, 140-199 = 2 units, 200-250 = 4 units, 251-299 = 6 units,  300-349 = 8 units,  350 -399 = 10 units. 12/11/20   Florencia Reasons, MD  insulin detemir (LEVEMIR) 100 UNIT/ML injection Inject 0.08 mLs (8 Units total) into the skin at bedtime. 04/27/21   Roxan Hockey, MD  Insulin Pen Needle 32G X 4 MM MISC Use to give insulin daily Dx E11.29 04/15/19   Charlynne Cousins, MD  melatonin 5 MG TABS Take 5 mg by mouth at bedtime as needed (insomnia).    [provider]  midodrine (PROAMATINE) 10 MG tablet Take 1 tablet (10 mg total) by mouth 3 (three) times daily with meals. 04/26/21   Roxan Hockey, MD  Morphine Sulfate (MORPHINE CONCENTRATE) 10 mg / 0.5 ml concentrated solution Take 0.25 mLs by mouth every 4 (four) hours as needed for pain (shortness of breath). 06/29/21   [provider]  oxyCODONE-acetaminophen (PERCOCET) 5-325 MG tablet Take 1 tablet by mouth every 6 (six) hours as needed for severe pain. 08/03/21   [name], MD  OXYGEN Inhale 2 L into the lungs continuous.    [provider]  pantoprazole (PROTONIX) 40 MG tablet Take 1 tablet (40 mg total) by mouth 2 (two) times daily. 05/16/21   Barton Dubois, MD  rifaximin (XIFAXAN) 550 MG TABS tablet Take 1 tablet (550 mg total) by mouth 2 (two) times daily. Patient taking differently: Take 550 mg by mouth every 12 (twelve) hours. 05/16/21   Barton Dubois, MD  sildenafil (REVATIO) 20 MG tablet Take 1 tablet (20 mg total) by mouth 3 (three) times daily. 08/24/20   Larey Dresser, MD  Simethicone (GAS-X EXTRA  STRENGTH PO) Take 1 tablet by mouth every 6 (six) hours as needed for flatulence. 06/01/21   [provider]  traMADol (ULTRAM) 50 MG tablet  Take 1 tablet (50 mg total) by mouth every 8 (eight) hours as needed for severe pain. 05/16/21   Barton Dubois, MD  diltiazem [facility]) 240 MG 24 hr capsule TAKE 1 CAPSULE DAILY 10/19/12 02/17/13  Vernie Shanks, MD    Physical Exam: Vitals:   08/04/21 1730 08/04/21 1800 08/04/21 1915 08/04/21 2030  BP: 100/68 106/84 108/66 110/65  Pulse:  (!) 105 (!) 107 (!) 113  Resp: (!) 26 (!) 23 (!) 23 (!) 25  Temp:   98.4 F (36.9 C)   TempSrc:      SpO2:  100% 95% 92%  Weight:      Height:        Vitals:   08/04/21 1730 08/04/21 1800 08/04/21 1915 08/04/21 2030  BP: 100/68 106/84 108/66 110/65  Pulse:  (!) 105 (!) 107 (!) 113  Resp: (!) 26 (!) 23 (!) 23 (!) 25  Temp:   98.4 F (36.9 C)   TempSrc:      SpO2:  100% 95% 92%  Weight:      Height:       Constitutional: NAD, calm, comfortable Eyes: PERRL, lids and conjunctivae normal ENMT: Mucous membranes are dry, Posterior pharynx clear of any exudate or lesions.Normal dentition.  Neck: normal, supple, no masses, no thyromegaly Respiratory: clear to auscultation bilaterally, no wheezing, no crackles. Normal respiratory effort. No accessory muscle use.  Cardiovascular: Regular rate and rhythm, no murmurs / rubs / gallops. Trace extremity edema. 2+ pedal pulses.  Abdomen: obese ,mild diffuse tenderness, no masses palpated. No hepatosplenomegaly. Bowel sounds positive.  Musculoskeletal: no clubbing / cyanosis. No joint deformity upper and lower extremities. Good ROM, no contractures. Normal muscle tone.  Skin: no rashes, lesions, ulcers. No induration Neurologic: CN 2-12 grossly intact. Sensation intact, Strength 5/5 in all 4.  Psychiatric: Normal judgment and insight. Alert and oriented x 3. Normal mood.    Labs on Admission: I have personally reviewed following labs and imaging  studies  CBC: Recent Labs  Lab 08/03/21 0707 08/04/21 1455  WBC  --  5.6  NEUTROABS  --  3.8  HGB 14.6 11.3*  HCT 43.0 34.7*  MCV  --  99.4  PLT  --  83*   Basic Metabolic Panel: Recent Labs  Lab 08/03/21 0707 08/04/21 1455  NA 137 132*  K 4.1 4.1  CL 100 98  CO2  --  22  GLUCOSE 101* 85  BUN 56* 40*  CREATININE 7.10* 5.29*  CALCIUM  --  9.0   GFR: Estimated Creatinine Clearance: 15 mL/min (A) (by C-G formula based on SCr of 5.29 mg/dL (H)). Liver Function Tests: Recent Labs  Lab 08/04/21 1455  AST 27  ALT 15  ALKPHOS 69  BILITOT 1.1  PROT 6.2*  ALBUMIN 2.9*   Recent Labs  Lab 08/04/21 1455  LIPASE 18   No results for input(s): "AMMONIA" in the last 168 hours. Coagulation Profile: No results for input(s): "INR", "PROTIME" in the last 168 hours. Cardiac Enzymes: No results for input(s): "CKTOTAL", "CKMB", "CKMBINDEX", "TROPONINI" in the last 168 hours. BNP (last 3 results) No results for input(s): "PROBNP" in the last 8760 hours. HbA1C: No results for input(s): "HGBA1C" in the last 72 hours. CBG: Recent Labs  Lab 08/03/21 0933  GLUCAP 121*   Lipid Profile: No results for input(s): "CHOL", "HDL", "LDLCALC", "TRIG", "CHOLHDL", "LDLDIRECT" in the last 72 hours. Thyroid Function Tests: No results for input(s): "TSH", "T4TOTAL", "FREET4", "T3FREE", "THYROIDAB" in the last 72 hours. Anemia Panel:  No results for input(s): "VITAMINB12", "FOLATE", "FERRITIN", "TIBC", "IRON", "RETICCTPCT" in the last 72 hours. Urine analysis:    Component Value Date/Time   COLORURINE BROWN (A) 08/04/2021 1550   APPEARANCEUR TURBID (A) 08/04/2021 1550   LABSPEC 1.025 08/04/2021 1550   PHURINE 6.5 08/04/2021 1550   GLUCOSEU 100 (A) 08/04/2021 1550   HGBUR LARGE (A) 08/04/2021 1550   BILIRUBINUR MODERATE (A) 08/04/2021 1550   KETONESUR 15 (A) 08/04/2021 1550   PROTEINUR >300 (A) 08/04/2021 1550   UROBILINOGEN 0.2 05/16/2014 0822   NITRITE POSITIVE (A) 08/04/2021 1550    LEUKOCYTESUR LARGE (A) 08/04/2021 1550    Radiological Exams on Admission: DG Chest Port 1 View  Result Date: 08/04/2021 CLINICAL DATA:  Abdominal pain, dialysis due tomorrow EXAM: PORTABLE CHEST 1 VIEW COMPARISON:  Radiographs 05/27/2021 FINDINGS: Cardiomegaly. Patchy bilateral airspace and interstitial opacities suggestive of edema. Left basilar atelectasis/consolidation. Possible small bilateral pleural effusions. Aortic calcification. Right IJ CVC tip in the right atrium. Partially visualized gaseous distention of the stomach. IMPRESSION: Slight interval worsening in bilateral airspace disease compatible with edema. Probable small bilateral pleural effusions. Electronically Signed   By: Placido Sou M.D.   On: 08/04/2021 18:28    EKG: Independently reviewed. See above   Assessment/Plan  NSTEMI  -admit to progressive care  - continue on heparin, asa,statin -hold bb per cardio due to lower bp -echo in am  -further cardia recs in am   UTI -ctx -f/u cultures   Fluid overload  -cxr with pul edema - multifactorial hx of RV failure /pul htn as well as ESRD -s/p HD 8/1 per patient  -has HD TTS -renal consult to assist with volume management   Chronic respiratory failure due to Pul HTN/Chronic RV failure  -on 2 L of O2 - ? Mild exacerbation  - patient no complaints of sob from baseline - volume management with HD  ESRD -on HD - of note has clotted fistula  -was seen by vascular 8/1 , plan on graft out patient  - has HD cath for access  -nephrology consult Dr Joelyn Oms consulted   Hx of Gi bleed -continue ppi  -h/h stable at baseline of around 11 -monitor h/h  -note hgb 14 thought to be spurious   Chronic Afib -on coumadin , of for planned intervention for clotted AV fistula  -holding bb due to soft bp  -continue to monitor on tele   Chronic diastolic heart failure /RV failure  -volume management with HD   Pulmonary HTN/RV failure  -hold sildenafil in current  setting of stemi  NASH cirrhosis  -Hx of Hepatic Encephalopathy -continue on rifaximin and lactulose    DMII -A1c 4.3  -place on poc fs /iss -fs 120's currently   Hypothyroidism  -continue synthroid   HLD -continue lipitor     DVT prophylaxis: heparin drip Code Status: FULL Family Communication: none at bedside Disposition Plan: patient  expected to be admitted greater than 2 midnights  Consults called: Cardiology Dr Galen [place] nephrology  Admission status: progressive care   Clance Boll MD [facility]  If 7PM-7AM, please contact night-coverage [email or web address] Password [facility]  08/04/2021, 8:36 PM

## 2021-08-04 NOTE — ED Triage Notes (Signed)
Abdomen since yesterday. Dialysis due tomorrow. Reports burning on urination and brown urine.

## 2021-08-04 NOTE — Progress Notes (Addendum)
ANTICOAGULATION CONSULT NOTE - Initial Consult  Pharmacy Consult for Heparin Indication: chest pain/ACS  Allergies  Allergen Reactions   Codeine     Headache    Diclofenac    Ibuprofen    Indomethacin    Meloxicam    Naproxen     Patient Measurements: Height: 5' 4"  (162.6 cm) Weight: 111.6 kg (246 lb) IBW/kg (Calculated) : 59.2 Heparin Dosing Weight: 85.3 kg  Vital Signs: Temp: 98.1 F (36.7 C) (08/02 1435) Temp Source: Oral (08/02 1435) BP: 116/70 (08/02 1530) Pulse Rate: 104 (08/02 1530)  Labs: Recent Labs    08/03/21 0707 08/04/21 1455  HGB 14.6 11.3*  HCT 43.0 34.7*  PLT  --  83*  CREATININE 7.10* 5.29*  TROPONINIHS  --  1,082*    Estimated Creatinine Clearance: 15 mL/min (A) (by C-G formula based on SCr of 5.29 mg/dL (H)).   Medical History: Past Medical History:  Diagnosis Date   Anemia    Anxiety    Asthma    CAD (coronary artery disease)    LHC 2/08:  mRCA 50%, o/w no sig CAD, EF 25%   Cardiomyopathy ([place])    EF 35-40% in 2/16 in setting of AF with RVR >> echo 5/16 with improved LVF with EF 65-70%   CHF (congestive heart failure) (HCC)    Chronic atrial fibrillation (HCC)    Chronic diastolic heart failure (HCC)    HFPF - EF ~65-70% (OFTEN EXACERBATED BY AFIB)   Cirrhosis of liver (HCC)    CKD (chronic kidney disease)    hx of worsening renal failure and hyperK+ in setting of acute diast CHF >> required CVVHD in 4/16 and dialysis x 1 in 5/16   CKD stage 3 due to type 2 diabetes mellitus ([place]) 09/17/2015   Colon cancer ([place])    Colon polyps 09/21/2012   Tubular adenoma   Diabetes (HCC)    GERD (gastroesophageal reflux disease)    Heart murmur    Hyperlipidemia    Hypertension    Hypothyroidism    Insomnia    Nonalcoholic steatohepatitis    Obesity hypoventilation syndrome ([place])    Pulmonary hypertension (HCC)    Renal insufficiency    Sleep apnea    Thrombocytopenia (HCC)     Medications:  (Not in a hospital  admission)  Scheduled:   aspirin  324 mg Oral Once   Infusions:  PRN:   Assessment: 14 yom with a history of CAD, HF, AF, cirrhosis of liver, ESRD on HD, DM, HLD, HTN, and thrombocytopenia. Patient is presenting with abdominal pain, brown/painful urination, and persistent burning in upper abdomen. Heparin per pharmacy consult placed for chest pain/ACS.  Patient is s/p insertion of rt upper arm AV graft 8/1.  Patient is not on anticoagulation prior to arrival.  Hgb 11.3; plt 83  Goal of Therapy:  Heparin level 0.3-0.7 units/ml Monitor platelets by anticoagulation protocol: Yes   Plan:  No initial heparin bolus Start heparin infusion at 1000 units/hr Check anti-Xa level in 8 hours and daily while on heparin Continue to monitor H&H and platelets  Lorelei Pont, PharmD, BCPS 08/04/2021 5:10 PM ED Clinical Pharmacist -  [phone]

## 2021-08-04 NOTE — ED Provider Notes (Signed)
[facility] EMERGENCY DEPARTMENT Provider Note   CSN: [id number] Arrival date & time: 08/04/21  1425     History  Chief Complaint  Patient presents with   Abdominal Pain     William Flores is a 71 y.o. male.  HPI Patient here for evaluation of 2 problems.  He states he has indigestion characterized by belching which started yesterday and persistent feeling of burning in his upper abdomen.  He also has pain in his lower abdomen, and has noticed brown and painful urination, the last couple of days.  He typically urinates between 0 and 3 times a day.  He dialyzed yesterday.  He also had a fistula placed yesterday in the right forearm.  He is currently being dialyzed through a right upper chest wall catheter.  He denies fever, chills, cough, weakness or dizziness.  He thinks he might of strained his chest when he was helping himself to move by pulling on a overhead trapeze.  He was only able to do that with the left hand because of the surgery on the right arm yesterday.    Home Medications Prior to Admission medications   Medication Sig Start Date End Date Taking? Authorizing Provider  Allopurinol 200 MG TABS Take 200 mg by mouth daily.   Yes [provider]  atorvastatin (LIPITOR) 20 MG tablet TAKE 1 TABLET BY MOUTH EVERY DAY Patient taking differently: Take 20 mg by mouth every evening. 03/19/20  Yes [name], [name], MD  levothyroxine (SYNTHROID) 100 MCG tablet Take 100 mcg by mouth daily.   Yes [provider]  polyethylene glycol (MIRALAX / GLYCOLAX) 17 g packet Take 17 g by mouth daily. 01/01/19  Yes Roxan Hockey, MD  acetaminophen (TYLENOL) 160 MG/5ML liquid Take 650 mg by mouth every 4 (four) hours as needed for fever.    [provider]  B Complex-C (B-COMPLEX WITH VITAMIN C) tablet Take 1 tablet by mouth daily. 12/12/20   Florencia Reasons, MD  Cholecalciferol (VITAMIN D) 125 MCG (5000 UT) CAPS Take 5,000 Units by mouth daily.    [provider]   ciprofloxacin (CIPRO) 500 MG tablet Take 1 tablet (500 mg total) by mouth daily. Patient not taking: Reported on 07/29/2021 07/01/21   Primus Bravo., MD  ENULOSE 10 GM/15ML SOLN Take 20 g by mouth daily. 04/13/21   [provider]  ferrous sulfate 325 (65 FE) MG tablet Take 325 mg by mouth daily.    [provider]  glucose blood (CONTOUR NEXT TEST) test strip Test BS TID Dx E11.29 05/22/20   [name], [name], MD  guaiFENesin (ROBITUSSIN) 100 MG/5ML liquid Take 5 mLs by mouth every 4 (four) hours as needed for cough or to loosen phlegm.    [provider]  hydrocortisone (CORTEF) 10 MG tablet Take 1 tablet (10 mg total) by mouth 2 (two) times daily. Patient taking differently: Take 10 mg by mouth every 12 (twelve) hours. 05/16/21   Barton Dubois, MD  insulin aspart (NOVOLOG) 100 UNIT/ML injection Before each meal 3 times a day, 140-199 - 2 units, 200-250 - 4 units, 251-299 - 6 units,  300-349 - 8 units,  350 or above 10 units. Insulin PEN if approved, provide syringes and needles if needed. Patient taking differently: Before each meal 3 times a day, 140-199 = 2 units, 200-250 = 4 units, 251-299 = 6 units,  300-349 = 8 units,  350 -399 = 10 units. 12/11/20   Florencia Reasons, MD  insulin detemir (  LEVEMIR) 100 UNIT/ML injection Inject 0.08 mLs (8 Units total) into the skin at bedtime. 04/27/21   Roxan Hockey, MD  Insulin Pen Needle 32G X 4 MM MISC Use to give insulin daily Dx E11.29 04/15/19   Charlynne Cousins, MD  melatonin 5 MG TABS Take 5 mg by mouth at bedtime as needed (insomnia).    [provider]  midodrine (PROAMATINE) 10 MG tablet Take 1 tablet (10 mg total) by mouth 3 (three) times daily with meals. 04/26/21   Roxan Hockey, MD  Morphine Sulfate (MORPHINE CONCENTRATE) 10 mg / 0.5 ml concentrated solution Take 0.25 mLs by mouth every 4 (four) hours as needed for pain (shortness of breath). 06/29/21   [provider]  oxyCODONE-acetaminophen  (PERCOCET) 5-325 MG tablet Take 1 tablet by mouth every 6 (six) hours as needed for severe pain. 08/03/21   [name], MD  OXYGEN Inhale 2 L into the lungs continuous.    [provider]  pantoprazole (PROTONIX) 40 MG tablet Take 1 tablet (40 mg total) by mouth 2 (two) times daily. 05/16/21   Barton Dubois, MD  rifaximin (XIFAXAN) 550 MG TABS tablet Take 1 tablet (550 mg total) by mouth 2 (two) times daily. Patient taking differently: Take 550 mg by mouth every 12 (twelve) hours. 05/16/21   Barton Dubois, MD  sildenafil (REVATIO) 20 MG tablet Take 1 tablet (20 mg total) by mouth 3 (three) times daily. 08/24/20   Larey Dresser, MD  Simethicone (GAS-X EXTRA STRENGTH PO) Take 1 tablet by mouth every 6 (six) hours as needed for flatulence. 06/01/21   [provider]  traMADol (ULTRAM) 50 MG tablet Take 1 tablet (50 mg total) by mouth every 8 (eight) hours as needed for severe pain. 05/16/21   Barton Dubois, MD  diltiazem [facility]) 240 MG 24 hr capsule TAKE 1 CAPSULE DAILY 10/19/12 02/17/13  Vernie Shanks, MD      Allergies    Codeine, Diclofenac, Ibuprofen, Indomethacin, Meloxicam, and Naproxen    Review of Systems   Review of Systems  Physical Exam Updated Vital Signs BP 116/70   Pulse (!) 104   Temp 98.1 F (36.7 C) (Oral)   Resp 19   Ht 5' 4"  (1.626 m)   Wt 111.6 kg   SpO2 100%   BMI 42.23 kg/m  Physical Exam Vitals and nursing note reviewed.  Constitutional:      General: He is not in acute distress.    Appearance: He is well-developed. He is not ill-appearing or diaphoretic.  HENT:     Head: Normocephalic and atraumatic.     Right Ear: External ear normal.     Left Ear: External ear normal.  Eyes:     Conjunctiva/sclera: Conjunctivae normal.     Pupils: Pupils are equal, round, and reactive to light.  Neck:     Trachea: Phonation normal.  Cardiovascular:     Rate and Rhythm: Normal rate and regular rhythm.     Heart sounds: Normal heart sounds.      Comments: Dialysis catheter right upper chest wall, site and appliance appear normal. Pulmonary:     Effort: Pulmonary effort is normal. No respiratory distress.     Breath sounds: Normal breath sounds. No stridor.  Chest:     Chest wall: No tenderness.  Abdominal:     General: There is no distension.     Palpations: Abdomen is soft.     Tenderness: There is no abdominal tenderness. There is no  guarding.  Musculoskeletal:        General: Tenderness (Right forearm, wound from surgery yesterday is bandaged.) present.     Cervical back: Normal range of motion and neck supple.  Skin:    General: Skin is warm and dry.     Comments: Indistinct erythema right upper arm, medial aspect over a nonfunctioning fistula.  This is nonspecific appearance, and does not appear infected.  Neurological:     Mental Status: He is alert and oriented to person, place, and time.     Cranial Nerves: No cranial nerve deficit.     Sensory: No sensory deficit.     Motor: No abnormal muscle tone.     Coordination: Coordination normal.  Psychiatric:        Mood and Affect: Mood normal.        Behavior: Behavior normal.        Thought Content: Thought content normal.        Judgment: Judgment normal.     ED Results / Procedures / Treatments   Labs (all labs ordered are listed, but only abnormal results are displayed) Labs Reviewed  CBC WITH DIFFERENTIAL/PLATELET - Abnormal; Notable for the following components:      Result Value   RBC 3.49 (*)    Hemoglobin 11.3 (*)    HCT 34.7 (*)    RDW 19.2 (*)    Platelets 83 (*)    nRBC 0.4 (*)    All other components within normal limits  COMPREHENSIVE METABOLIC PANEL - Abnormal; Notable for the following components:   Sodium 132 (*)    BUN 40 (*)    Creatinine, Ser 5.29 (*)    Total Protein 6.2 (*)    Albumin 2.9 (*)    GFR, Estimated 11 (*)    All other components within normal limits  URINALYSIS, ROUTINE W REFLEX MICROSCOPIC - Abnormal; Notable for the  following components:   Color, Urine BROWN (*)    APPearance TURBID (*)    Glucose, UA 100 (*)    Hgb urine dipstick LARGE (*)    Bilirubin Urine MODERATE (*)    Ketones, ur 15 (*)    Protein, ur >300 (*)    Nitrite POSITIVE (*)    Leukocytes,Ua LARGE (*)    All other components within normal limits  URINALYSIS, MICROSCOPIC (REFLEX) - Abnormal; Notable for the following components:   Bacteria, UA FEW (*)    All other components within normal limits  TROPONIN I (HIGH SENSITIVITY) - Abnormal; Notable for the following components:   Troponin I (High Sensitivity) 1,082 (*)    All other components within normal limits  URINE CULTURE  LIPASE, BLOOD  HEPARIN LEVEL (UNFRACTIONATED)  HEPARIN LEVEL (UNFRACTIONATED)  PROTIME-INR  TROPONIN I (HIGH SENSITIVITY)    EKG EKG Interpretation  Date/Time:  Wednesday August 04 2021 15:41:16 EDT Ventricular Rate:  109 PR Interval:    QRS Duration: 105 QT Interval:  362 QTC Calculation: 488 R Axis:   122 Text Interpretation: Atrial fibrillation Right axis deviation Abnormal lateral Q waves Anteroseptal infarct, old Borderline T abnormalities, inferior leads since last tracing no significant change Confirmed by Daleen Bo [phone]) on 08/04/2021 3:47:11 PM  Radiology No results found.  Procedures Procedures    Medications Ordered in ED Medications  heparin ADULT infusion 100 units/mL (25000 units/227m) (1,000 Units/hr Intravenous New Bag/Given 08/04/21 1806)  cefTRIAXone (ROCEPHIN) 1 g in sodium chloride 0.9 % 100 mL IVPB (has no administration in time range)  aspirin chewable  tablet 324 mg (324 mg Oral Given 08/04/21 1714)    ED Course/ Medical Decision Making/ A&P Clinical Course as of 08/04/21 1812  Wed Aug 04, 2021  1705 Initial troponin very elevated.  Last troponin was in the 400s, at that time he had septic shock.  Patient does not have a shock picture today.  He has heart failure.  It does not appear that he has ever had a left  heart catheterization.  Will initiate treatment for NSTEMI with aspirin and heparin.  At this time he states he still has "indigestion. [EW]    Clinical Course User Index [EW] Daleen Bo, MD                           Medical Decision Making Elderly patient with end-stage renal disease presenting with indigestion, and discolored and painful urination.  He is debilitated, chronically ill and lives in a nursing care facility.  Amount and/or Complexity of Data Reviewed Independent Historian:     Details: He is a cogent historian External Data Reviewed: labs, ECG and notes.    Details: Patient with prior history of right heart failure, with dilated right ventricle.  He has decreased left heart systolic function as well.  He has not previously had left heart cath.  Patient has chronic atrial fibrillation.  He has previously been anticoagulated with warfarin. Labs: ordered.    Details: CBC, metabolic panel, troponin, urinalysis.  -- Normal except urinalysis consistent with infection, urine culture ordered, hemoglobin low, sodium low, BUN high, creatinine high, protein low, albumin low, GFR low, troponin high Radiology: ordered and independent interpretation performed.    Details: Chest x-ray-mild fluid overload with pulmonary infiltrates scattered, doubt consolidation/pneumonia Discussion of management or test interpretation with external provider(s): Case discussed with cardiologist, at [facility], [place].  He will see patient as a Optometrist and request that the hospitalist admit the patient.  He also request that the patient go to [facility] emergency department so he can see the patient prior to admission.  I have contacted [facility], who will see the patient.  Risk OTC drugs. Prescription drug management. Decision regarding hospitalization. Risk Details: Patient presenting with indigestion, found to have NSTEMI, elevated troponin.  Aspirin and heparin started.  Initially did not  start nitroglycerin because of borderline hypotension.  Urinalysis returned consistent with infection and Rocephin was ordered.  Case discussed with cardiologist who will see the patient is consulted and request that he be admitted by the hospitalist service.  Patient will require transfer to [facility].  I have discussed the case with ED attending there Dr. Philip Aspen.  He accepts the patient to the ED for stabilization and management.  We will attempt to have the patient seen by hospice here prior to transfer however if that does not happen he will have to be seen by hospitalist at [place] Total time providing critical care: 50 minutes               Final Clinical Impression(s) / ED Diagnoses Final diagnoses:  NSTEMI (non-ST elevated myocardial infarction) [facility])  Urinary tract infection without hematuria, site unspecified    Rx / DC Orders ED Discharge Orders     None         Daleen Bo, MD 08/04/21 [phone]

## 2021-08-04 NOTE — Consult Note (Signed)
Cardiology Consultation:   Patient ID: ASIER DESROCHES MRN: [id number]; DOB: [birth year]  Admit date: 08/04/2021 Date of Consult: 08/04/2021  PCP:  [name], [name], MD   [facility] Providers Cardiologist:  Minus Breeding, MD        Patient Profile:   VERYL WINEMILLER is a 71 y.o. male with a hx of CAD, no PCI, paroxysmal afib, liver cirrhosis, chronic thrombocytopenia, severe pulm HTN, ESRD on dialysis, colon CA s/p resection, sleep apnea, prior GI bleed 04/2021 lives at a nursing facility  who is being seen 08/04/2021 for the evaluation of NSTEMI at the request of Dr. Melina Copa .  History of Present Illness:   He presented to [facility] with indigestion. He was in afib. Troponin 417->413-> 1082. Cardiology was called and plan was for transfer to [facility] for NSTEMI. He was loaded with asa. Continue on heparin.  He has hx of non obstructive CAD cath 2008. Mid RCA 50%. EF was 25% at that time. He has had low EF 35-40 in 2016 thought 2/2 tachy-mediated CM in Afib.  Had GI bleed in April requiring transfusion with hematemesis. Was on eliquis which was held in 05/2021. GI could not perform EGD 2/2 instability at that time. See DC summary 05/16/2021. At that time had hypotension and shock in the setting of RV failure. He was placed on midodrine. He was not tolerating IHD at that time and palliative was recommended. He is not an ADHF candidate due to multiple co-morbidities.  He recently had placement of a right brachiocephalic fistula by Dr. Donnetta Hutching from vascular on 03/02/2021.He was found at the dialysis center to no longer have fistula thrill or bruit and was seen yesterday for further evaluation. He did an [place] in the office of his cephalic vein and it is patent above the anastomosis but is occluded for several centimeters along the anastomosis.  He does have a patent basilic vein.  He has multiple small branches near the antecubital space and a larger basilic vein in the mid upper arm. He recommended a  prosthetic graft. Per his note, Mr. Barrack was on coumadin with last dose   Past Medical History:  Diagnosis Date   Anemia    Anxiety    Asthma    CAD (coronary artery disease)    LHC 2/08:  mRCA 50%, o/w no sig CAD, EF 25%   Cardiomyopathy ([place])    EF 35-40% in 2/16 in setting of AF with RVR >> echo 5/16 with improved LVF with EF 65-70%   CHF (congestive heart failure) (HCC)    Chronic atrial fibrillation (HCC)    Chronic diastolic heart failure (HCC)    HFPF - EF ~65-70% (OFTEN EXACERBATED BY AFIB)   Cirrhosis of liver (HCC)    CKD (chronic kidney disease)    hx of worsening renal failure and hyperK+ in setting of acute diast CHF >> required CVVHD in 4/16 and dialysis x 1 in 5/16   CKD stage 3 due to type 2 diabetes mellitus ([place]) 09/17/2015   Colon cancer ([place])    Colon polyps 09/21/2012   Tubular adenoma   Diabetes (HCC)    GERD (gastroesophageal reflux disease)    Heart murmur    Hyperlipidemia    Hypertension    Hypothyroidism    Insomnia    Nonalcoholic steatohepatitis    Obesity hypoventilation syndrome ([place])    Pulmonary hypertension ([place])    Renal insufficiency    Sleep apnea    Thrombocytopenia ([place])  Past Surgical History:  Procedure Laterality Date   AV FISTULA PLACEMENT Right 03/02/2021   Procedure: RIGHT ARM ARTERIOVENOUS (AV) FISTULA CREATION;  Surgeon: [name], MD;  Location: [facility];  Service: Vascular;  Laterality: Right;   AV FISTULA PLACEMENT Right 08/03/2021   Procedure: INSERTION OF RIGHT UPPER ARM ARTERIOVENOUS (AV) GORE-TEX GRAFT;  Surgeon: [name], MD;  Location: [facility];  Service: Vascular;  Laterality: Right;   CARDIAC ELECTROPHYSIOLOGY [place]  05/2021   CIRCUMCISION     COLON RESECTION     COLONOSCOPY N/A 09/21/2012   Procedure: COLONOSCOPY;  Surgeon: Inda Castle, MD;  Location: [facility];  Service: Endoscopy;  Laterality: N/A;   COLONOSCOPY N/A 12/28/2018   Procedure: COLONOSCOPY;  Surgeon: Rogene Houston, MD;  Location: [facility];  Service: Endoscopy;  Laterality: N/A;   COLONOSCOPY WITH PROPOFOL N/A 10/17/2020   Procedure: COLONOSCOPY WITH PROPOFOL;  Surgeon: Harvel Quale, MD;  Location: [facility];  Service: Gastroenterology;  Laterality: N/A;   COLONOSCOPY WITH PROPOFOL N/A 04/25/2021   Procedure: COLONOSCOPY WITH PROPOFOL;  Surgeon: Rogene Houston, MD;  Location: [facility];  Service: Endoscopy;  Laterality: N/A;   ENTEROSCOPY N/A 06/24/2020   Procedure: ENTEROSCOPY;  Surgeon: Gatha Mayer, MD;  Location: [facility] ENDOSCOPY;  Service: Endoscopy;  Laterality: N/A;   ESOPHAGOGASTRODUODENOSCOPY N/A 12/28/2018   Procedure: ESOPHAGOGASTRODUODENOSCOPY (EGD);  Surgeon: Rogene Houston, MD;  [facility]: [facility];  Service: Endoscopy;  Laterality: N/A;   HOT HEMOSTASIS  04/25/2021   Procedure: HOT HEMOSTASIS (ARGON PLASMA COAGULATION/BICAP);  Surgeon: Rogene Houston, MD;  [facility]: [facility];  Service: Endoscopy;;   IR FLUORO GUIDE CV LINE RIGHT  12/10/2020   IR US GUIDE [place] RIGHT  12/11/2020   POLYPECTOMY  10/17/2020   Procedure: POLYPECTOMY;  Surgeon: Harvel Quale, MD;  Location: [facility];  Service: Gastroenterology;;  rectal, transverse, descending, ascending, sigmoid   RIGHT HEART CATH N/A 04/11/2019   Procedure: RIGHT HEART CATH;  Surgeon: Larey Dresser, MD;  Location: [place] CV LAB;  Service: Cardiovascular;  Laterality: N/A;   US ECHOCARDIOGRAPHY  03/04/2010   abnormal study     Home Medications:  Prior to Admission medications   Medication Sig Start Date End Date Taking? Authorizing Provider  Allopurinol 200 MG TABS Take 200 mg by mouth daily.   Yes [provider]  atorvastatin (LIPITOR) 20 MG tablet TAKE 1 TABLET BY MOUTH EVERY DAY Patient taking differently: Take 20 mg by mouth every evening. 03/19/20  Yes [name], [name], MD  levothyroxine (SYNTHROID) 100 MCG tablet Take 100 mcg by mouth daily.   Yes  [provider]  polyethylene glycol (MIRALAX / GLYCOLAX) 17 g packet Take 17 g by mouth daily. 01/01/19  Yes Roxan Hockey, MD  acetaminophen (TYLENOL) 160 MG/5ML liquid Take 650 mg by mouth every 4 (four) hours as needed for fever.    [provider]  B Complex-C (B-COMPLEX WITH VITAMIN C) tablet Take 1 tablet by mouth daily. 12/12/20   Florencia Reasons, MD  Cholecalciferol (VITAMIN D) 125 MCG (5000 UT) CAPS Take 5,000 Units by mouth daily.    [provider]  ciprofloxacin (CIPRO) 500 MG tablet Take 1 tablet (500 mg total) by mouth daily. Patient not taking: Reported on 07/29/2021 07/01/21   Primus Bravo., MD  ENULOSE 10 GM/15ML SOLN Take 20 g by mouth daily. 04/13/21   [provider]  ferrous sulfate 325 (  65 FE) MG tablet Take 325 mg by mouth daily.    [provider]  glucose blood (CONTOUR NEXT TEST) test strip Test BS TID Dx E11.29 05/22/20   [name], [name], MD  guaiFENesin (ROBITUSSIN) 100 MG/5ML liquid Take 5 mLs by mouth every 4 (four) hours as needed for cough or to loosen phlegm.    [provider]  hydrocortisone (CORTEF) 10 MG tablet Take 1 tablet (10 mg total) by mouth 2 (two) times daily. Patient taking differently: Take 10 mg by mouth every 12 (twelve) hours. 05/16/21   Barton Dubois, MD  insulin aspart (NOVOLOG) 100 UNIT/ML injection Before each meal 3 times a day, 140-199 - 2 units, 200-250 - 4 units, 251-299 - 6 units,  300-349 - 8 units,  350 or above 10 units. Insulin PEN if approved, provide syringes and needles if needed. Patient taking differently: Before each meal 3 times a day, 140-199 = 2 units, 200-250 = 4 units, 251-299 = 6 units,  300-349 = 8 units,  350 -399 = 10 units. 12/11/20   Florencia Reasons, MD  insulin detemir (LEVEMIR) 100 UNIT/ML injection Inject 0.08 mLs (8 Units total) into the skin at bedtime. 04/27/21   Roxan Hockey, MD  Insulin Pen Needle 32G X 4 MM MISC Use to give insulin daily Dx E11.29 04/15/19    Charlynne Cousins, MD  melatonin 5 MG TABS Take 5 mg by mouth at bedtime as needed (insomnia).    [provider]  midodrine (PROAMATINE) 10 MG tablet Take 1 tablet (10 mg total) by mouth 3 (three) times daily with meals. 04/26/21   Roxan Hockey, MD  Morphine Sulfate (MORPHINE CONCENTRATE) 10 mg / 0.5 ml concentrated solution Take 0.25 mLs by mouth every 4 (four) hours as needed for pain (shortness of breath). 06/29/21   [provider]  oxyCODONE-acetaminophen (PERCOCET) 5-325 MG tablet Take 1 tablet by mouth every 6 (six) hours as needed for severe pain. 08/03/21   [name], MD  OXYGEN Inhale 2 L into the lungs continuous.    [provider]  pantoprazole (PROTONIX) 40 MG tablet Take 1 tablet (40 mg total) by mouth 2 (two) times daily. 05/16/21   Barton Dubois, MD  rifaximin (XIFAXAN) 550 MG TABS tablet Take 1 tablet (550 mg total) by mouth 2 (two) times daily. Patient taking differently: Take 550 mg by mouth every 12 (twelve) hours. 05/16/21   Barton Dubois, MD  sildenafil (REVATIO) 20 MG tablet Take 1 tablet (20 mg total) by mouth 3 (three) times daily. 08/24/20   Larey Dresser, MD  Simethicone (GAS-X EXTRA STRENGTH PO) Take 1 tablet by mouth every 6 (six) hours as needed for flatulence. 06/01/21   [provider]  traMADol (ULTRAM) 50 MG tablet Take 1 tablet (50 mg total) by mouth every 8 (eight) hours as needed for severe pain. 05/16/21   Barton Dubois, MD  diltiazem [facility]) 240 MG 24 hr capsule TAKE 1 CAPSULE DAILY 10/19/12 02/17/13  Vernie Shanks, MD    Inpatient Medications: Scheduled Meds:  atorvastatin  80 mg Oral Daily   Continuous Infusions:  heparin 1,000 Units/hr (08/04/21 1806)   PRN Meds:   Allergies:    Allergies  Allergen Reactions   Codeine     Headache    Diclofenac    Ibuprofen    Indomethacin    Meloxicam    Naproxen     Social History:   Social History   Socioeconomic History   Marital  status: Divorced     Spouse name: Not on file   Number of children: 1   Years of education: 12   Highest education level: 12th grade  Occupational History    Employer: [employer]  Tobacco Use   Smoking status: Former    Types: Cigarettes    Quit date: 08/06/1983    Years since quitting: 38.0   Smokeless tobacco: Never  Vaping Use   Vaping Use: Never used  Substance and Sexual Activity   Alcohol use: No   Drug use: No   Sexual activity: Not Currently  Other Topics Concern   Not on file  Social History Narrative   Lives alone   Social Determinants of Health   Financial Resource Strain: Low Risk  (12/02/2020)   Overall Financial Resource Strain (CARDIA)    Difficulty of Paying Living Expenses: Not very hard  Food Insecurity: No Food Insecurity (11/25/2020)   Hunger Vital Sign    Worried About Running Out of Food in the Last Year: Never true    Ran Out of Food in the Last Year: Never true  Transportation Needs: No Transportation Needs (12/02/2020)   PRAPARE - Hydrologist (Medical): No    Lack of Transportation (Non-Medical): No  Physical Activity: Inactive (03/16/2020)   Exercise Vital Sign    Days of Exercise per Week: 0 days    Minutes of Exercise per Session: 0 min  Stress: No Stress Concern Present (04/30/2019)   [place]    Feeling of Stress : Only a little  Social Connections: Socially Isolated (03/16/2020)   Social Connection and Isolation Panel [NHANES]    Frequency of Communication with Friends and Family: More than three times a week    Frequency of Social Gatherings with Friends and Family: More than three times a week    Attends Religious Services: Never    Marine scientist or Organizations: No    Attends Archivist Meetings: Never    Marital Status: Divorced  Human resources officer Violence: Not At Risk (04/30/2019)   Humiliation, Afraid, Rape, and Kick questionnaire     Fear of Current or Ex-Partner: No    Emotionally Abused: No    Physically Abused: No    Sexually Abused: No    Family History:    Family History  Problem Relation Age of Onset   Breast cancer Mother    Diabetes Mother    Heart attack Father      ROS:  Please see the history of present illness.   All other ROS reviewed and negative.     Physical Exam/Data:   Vitals:   08/04/21 1730 08/04/21 1800 08/04/21 1915 08/04/21 2030  BP: 100/68 106/84 108/66 110/65  Pulse:  (!) 105 (!) 107 (!) 113  Resp: (!) 26 (!) 23 (!) 23 (!) 25  Temp:   98.4 F (36.9 C)   TempSrc:      SpO2:  100% 95% 92%  Weight:      Height:       No intake or output data in the 24 hours ending 08/04/21 2049    08/04/2021    2:45 PM 07/30/2021    1:14 PM 06/30/2021    9:13 AM  Last 3 Weights  Weight (lbs) 246 lb 246 lb 14.6 oz 239 lb 12.8 oz  Weight (kg) 111.585 kg 112 kg 108.773 kg     Body mass index is 42.23  kg/m.  General:  chronically ill appearing HEENT: normal Neck: no JVD Vascular: No carotid bruits; Distal pulses 2+ bilaterally Cardiac:  normal S1, S2; irregularly irregular; no murmur  Lungs:  clear to auscultation bilaterally, no wheezing, rhonchi or rales  Abd: distended Ext: R arm erythematous, open wound, no pus. No LE edema Musculoskeletal:  No deformities, BUE and BLE strength normal and equal Skin: warm and dry  Neuro:  CNs 2-12 intact, no focal abnormalities noted Psych:  Normal affect   EKG:  The EKG was personally reviewed and demonstrates:  Afib rate controlled, possible anteroseptal infarct. Inferior axis PVCs   Telemetry:  Telemetry was personally reviewed and demonstrates:  afib rate controlled, PVCs  Relevant CV Studies: TTE 05/07/2021  1. Left ventricular ejection fraction, by estimation, is 60 to 65%. The  left ventricle has normal function. The left ventricle has no regional  wall motion abnormalities. Left ventricular diastolic parameters are  indeterminate.    2. Ventricular septum is flattened in systole consistent with RV pressure  overload. . Right ventricular systolic function is severely reduced. The  right ventricular size is severely enlarged. Tricuspid regurgitation  signal is inadequate for assessing PA   pressure.   3. Left atrial size was severely dilated.   4. Right atrial size was severely dilated.   5. The mitral valve is abnormal. Trivial mitral valve regurgitation. No  evidence of mitral stenosis. Moderate mitral annular calcification.   6. Tricuspid valve regurgitation not well visualized. not well visualized  tricuspid stenosis.   7. The aortic valve is tricuspid. There is moderate calcification of the  aortic valve. There is moderate thickening of the aortic valve. Aortic  valve regurgitation is not visualized. No aortic stenosis is present.   8. The inferior vena cava is dilated in size with <50% respiratory  variability, suggesting right atrial pressure of 15 mmHg.   11/2020 echo: LVEF 55-60% no WMAs, indet diastolic function, Dseptum, severe RV dysfunction and enlargement, severe pulm HTN, mild to mod MR,   04/2019 RHC: mean PA 53, PCWP 23, CI 2.71 PVR 5 WU  Laboratory Data:  High Sensitivity Troponin:   Recent Labs  Lab 08/04/21 1455 08/04/21 1755  TROPONINIHS 1,082* 1,057*     Chemistry Recent Labs  Lab 08/03/21 0707 08/04/21 1455  NA 137 132*  K 4.1 4.1  CL 100 98  CO2  --  22  GLUCOSE 101* 85  BUN 56* 40*  CREATININE 7.10* 5.29*  CALCIUM  --  9.0  GFRNONAA  --  11*  ANIONGAP  --  12    Recent Labs  Lab 08/04/21 1455  PROT 6.2*  ALBUMIN 2.9*  AST 27  ALT 15  ALKPHOS 69  BILITOT 1.1   Lipids No results for input(s): "CHOL", "TRIG", "HDL", "LABVLDL", "LDLCALC", "CHOLHDL" in the last 168 hours.   Hematology Recent Labs  Lab 08/03/21 0707 08/04/21 1455  WBC  --  5.6  RBC  --  3.49*  HGB 14.6 11.3*  HCT 43.0 34.7*  MCV  --  99.4  MCH  --  32.4  MCHC  --  32.6  RDW  --  19.2*  PLT   --  83*   Thyroid No results for input(s): "TSH", "FREET4" in the last 168 hours.  BNPNo results for input(s): "BNP", "PROBNP" in the last 168 hours.  DDimer No results for input(s): "DDIMER" in the last 168 hours.   Radiology/Studies:  [facility] Chest Port 1 View  Result Date: 08/04/2021 CLINICAL  DATA:  Abdominal pain, dialysis due tomorrow EXAM: PORTABLE CHEST 1 VIEW COMPARISON:  Radiographs 05/27/2021 FINDINGS: Cardiomegaly. Patchy bilateral airspace and interstitial opacities suggestive of edema. Left basilar atelectasis/consolidation. Possible small bilateral pleural effusions. Aortic calcification. Right IJ CVC tip in the right atrium. Partially visualized gaseous distention of the stomach. IMPRESSION: Slight interval worsening in bilateral airspace disease compatible with edema. Probable small bilateral pleural effusions. Electronically Signed   By: Placido Sou M.D.   On: 08/04/2021 18:28     Assessment and Plan:   NSTEMI: patient presented with indigestion that has been persistent. He has multiple CVD risk factors. EKG is stable. Will plan for ACS management. He has no fevers or chills; R arm looks erythematous. No white count. He is getting ceftriaxone. Plt 83.  Hgb 11. Patient was accepted for transfer for LHC.  - R side has recent AVF; would plan for left radial or femoral  -after reviewing his hx recommend CORS only for now and discuss with GI prior to planning for DAPT which would also include AC for afib  -on IHD, planned for chronic dialysis  -continue heparin gtt  -continue asa 81 mg daily  -atorvastatin 80 mg daily  -Bps on the softer side will hold off on BB  -recommend palliative consult overall considering his overall picture    Shared Decision Making/Informed Consent The risks [stroke (1 in 1000), death (1 in 1000), kidney failure [usually temporary] (1 in 500), bleeding (1 in 200), allergic reaction [possibly serious] (1 in 200)], benefits (diagnostic support and  management of coronary artery disease) and alternatives of a cardiac catheterization were discussed in detail with Mr. Beckom and he is willing to proceed.  2. RV failure /Severe pulmonary HTN: precapillary pulmonary HTN. Group II/ Group III. UF per IHD. On slidenafil  3. Persistent Afib: in rate controlled afib. No amio candidate with cirrhosis - on heparin gtt - on coumadin at home  4. Hx of GI bleed: in May of 2023 with hematemesis. C/f upper GI bleed.  Had small bowel endoscopy June 2022 without presence of esophageal varices. Was unable to get EGD in May 2/2 instability  5. IHD: per nephrology. Tunneled catheter. Is planned for AV Gore-Tex graft as an outpatient with plan to coumadin around that time   Risk Assessment/Risk Scores:    :[id number]}   TIMI Risk Score for Unstable Angina or Non-ST Elevation MI:   The patient's TIMI risk score is 5, which indicates a 26% risk of all cause mortality, new or recurrent myocardial infarction or need for urgent revascularization in the next 14 days.{    For questions or updates, please contact [place] Please consult [email or web address] for contact info under    Signed, Janina Mayo, MD  08/04/2021 8:49 PM

## 2021-08-04 NOTE — ED Provider Notes (Signed)
Transfer from [facility] for NSTEMI.  Presented for burning upper abdominal pain and also painful urination.  History of dialysis and had a recent fistula placed.  Currently using catheter accessed chest.  Troponins elevated over thousand.  Urinalysis positive for infection treated with Rocephin.  Currently on a heparin drip.  He denies any current chest pain or abdominal burning. Physical Exam  BP 106/84   Pulse (!) 105   Temp 98.1 F (36.7 C) (Oral)   Resp (!) 23   Ht 5' 4"  (1.626 m)   Wt 111.6 kg   SpO2 100%   BMI 42.23 kg/m   Physical Exam  Procedures  Procedures  ED Course / MDM   Clinical Course as of 08/04/21 1910  Wed Aug 04, 2021  1705 Initial troponin very elevated.  Last troponin was in the 400s, at that time he had septic shock.  Patient does not have a shock picture today.  He has heart failure.  It does not appear that he has ever had a left heart catheterization.  Will initiate treatment for NSTEMI with aspirin and heparin.  At this time he states he still has "indigestion. [EW]    Clinical Course User Index [EW] Daleen Bo, MD   Medical Decision Making Amount and/or Complexity of Data Reviewed Labs: ordered. Radiology: ordered.  Risk OTC drugs. Prescription drug management.   Pete EKG does not show any significant changes.  I secure messaged cardiology Dr. Harl Bowie and let her know that he has arrived on campus.  1950.  Discussed with Dr. Marcello Moores [facility] hospitalist who will evaluate patient for admission.       Hayden Rasmussen, MD 08/05/21 [phone]

## 2021-08-04 NOTE — ED Notes (Signed)
Pt moved to hospital bed.

## 2021-08-04 NOTE — Anesthesia Postprocedure Evaluation (Signed)
Anesthesia Post Note  Patient: William Flores  Procedure(s) Performed: INSERTION OF RIGHT UPPER ARM ARTERIOVENOUS (AV) GORE-TEX GRAFT (Right: Arm Upper)  Patient location during evaluation: Phase II Anesthesia Type: General Level of consciousness: awake Pain management: pain level controlled Vital Signs Assessment: post-procedure vital signs reviewed and stable Respiratory status: spontaneous breathing and respiratory function stable Cardiovascular status: blood pressure returned to baseline and stable Postop Assessment: no headache and no apparent nausea or vomiting Anesthetic complications: no Comments: Late entry   No notable events documented.   Last Vitals:  Vitals:   08/03/21 1000 08/03/21 1030  BP: 92/78 (!) 125/113  Pulse:  92  Resp: 12 18  Temp:  (!) 36.4 C  SpO2: 99% 94%    Last Pain:  Vitals:   08/03/21 1040  TempSrc:   PainSc: 0-No pain                 Louann Sjogren

## 2021-08-05 DIAGNOSIS — I214 Non-ST elevation (NSTEMI) myocardial infarction: Secondary | ICD-10-CM | POA: Diagnosis not present

## 2021-08-05 DIAGNOSIS — N186 End stage renal disease: Secondary | ICD-10-CM | POA: Diagnosis not present

## 2021-08-05 DIAGNOSIS — E2740 Unspecified adrenocortical insufficiency: Secondary | ICD-10-CM

## 2021-08-05 DIAGNOSIS — Z7189 Other specified counseling: Secondary | ICD-10-CM

## 2021-08-05 DIAGNOSIS — I272 Pulmonary hypertension, unspecified: Secondary | ICD-10-CM

## 2021-08-05 LAB — CBC WITH DIFFERENTIAL/PLATELET
Abs Immature Granulocytes: 0.04 10*3/uL (ref 0.00–0.07)
Basophils Absolute: 0 10*3/uL (ref 0.0–0.1)
Basophils Relative: 1 %
Eosinophils Absolute: 0 10*3/uL (ref 0.0–0.5)
Eosinophils Relative: 0 %
HCT: 34.7 % — ABNORMAL LOW (ref 39.0–52.0)
Hemoglobin: 11.3 g/dL — ABNORMAL LOW (ref 13.0–17.0)
Immature Granulocytes: 1 %
Lymphocytes Relative: 16 %
Lymphs Abs: 0.9 10*3/uL (ref 0.7–4.0)
MCH: 32.4 pg (ref 26.0–34.0)
MCHC: 32.6 g/dL (ref 30.0–36.0)
MCV: 99.4 fL (ref 80.0–100.0)
Monocytes Absolute: 0.8 10*3/uL (ref 0.1–1.0)
Monocytes Relative: 14 %
Neutro Abs: 3.8 10*3/uL (ref 1.7–7.7)
Neutrophils Relative %: 68 %
Platelets: 83 10*3/uL — ABNORMAL LOW (ref 150–400)
RBC: 3.49 MIL/uL — ABNORMAL LOW (ref 4.22–5.81)
RDW: 19.2 % — ABNORMAL HIGH (ref 11.5–15.5)
WBC: 5.6 10*3/uL (ref 4.0–10.5)
nRBC: 0.4 % — ABNORMAL HIGH (ref 0.0–0.2)

## 2021-08-05 LAB — RENAL FUNCTION PANEL
Albumin: 2.7 g/dL — ABNORMAL LOW (ref 3.5–5.0)
Anion gap: 11 (ref 5–15)
BUN: 22 mg/dL (ref 8–23)
CO2: 28 mmol/L (ref 22–32)
Calcium: 8.6 mg/dL — ABNORMAL LOW (ref 8.9–10.3)
Chloride: 95 mmol/L — ABNORMAL LOW (ref 98–111)
Creatinine, Ser: 3.73 mg/dL — ABNORMAL HIGH (ref 0.61–1.24)
GFR, Estimated: 17 mL/min — ABNORMAL LOW (ref >60)
Glucose, Bld: 112 mg/dL — ABNORMAL HIGH (ref 70–99)
Phosphorus: 3.1 mg/dL (ref 2.5–4.6)
Potassium: 3.7 mmol/L (ref 3.5–5.1)
Sodium: 134 mmol/L — ABNORMAL LOW (ref 135–145)

## 2021-08-05 LAB — CBG MONITORING, ED
Glucose-Capillary: 63 mg/dL — ABNORMAL LOW (ref 70–99)
Glucose-Capillary: 75 mg/dL (ref 70–99)
Glucose-Capillary: 67 mg/dL — ABNORMAL LOW (ref 70–99)

## 2021-08-05 LAB — BASIC METABOLIC PANEL
Anion gap: 14 (ref 5–15)
BUN: 48 mg/dL — ABNORMAL HIGH (ref 8–23)
CO2: 21 mmol/L — ABNORMAL LOW (ref 22–32)
Calcium: 8.8 mg/dL — ABNORMAL LOW (ref 8.9–10.3)
Chloride: 97 mmol/L — ABNORMAL LOW (ref 98–111)
Creatinine, Ser: 5.93 mg/dL — ABNORMAL HIGH (ref 0.61–1.24)
GFR, Estimated: 10 mL/min — ABNORMAL LOW (ref >60)
Glucose, Bld: 74 mg/dL (ref 70–99)
Potassium: 5 mmol/L (ref 3.5–5.1)
Sodium: 132 mmol/L — ABNORMAL LOW (ref 135–145)

## 2021-08-05 LAB — PROTIME-INR
INR: 1.5 — ABNORMAL HIGH (ref 0.8–1.2)
Prothrombin Time: 17.6 s — ABNORMAL HIGH (ref 11.4–15.2)

## 2021-08-05 LAB — HEPATITIS B SURFACE ANTIGEN: Hepatitis B Surface Ag: NONREACTIVE

## 2021-08-05 LAB — CBC
HCT: 34.8 % — ABNORMAL LOW (ref 39.0–52.0)
Hemoglobin: 11.2 g/dL — ABNORMAL LOW (ref 13.0–17.0)
MCH: 32.1 pg (ref 26.0–34.0)
MCHC: 32.2 g/dL (ref 30.0–36.0)
MCV: 99.7 fL (ref 80.0–100.0)
Platelets: 74 10*3/uL — ABNORMAL LOW (ref 150–400)
RBC: 3.49 MIL/uL — ABNORMAL LOW (ref 4.22–5.81)
RDW: 19 % — ABNORMAL HIGH (ref 11.5–15.5)
WBC: 5.5 10*3/uL (ref 4.0–10.5)
nRBC: 0.4 % — ABNORMAL HIGH (ref 0.0–0.2)

## 2021-08-05 LAB — HEPARIN LEVEL (UNFRACTIONATED)
Heparin Unfractionated: <0.1 [IU]/mL — ABNORMAL LOW (ref 0.30–0.70)
Heparin Unfractionated: 0.14 [IU]/mL — ABNORMAL LOW (ref 0.30–0.70)
Heparin Unfractionated: 0.28 IU/mL — ABNORMAL LOW (ref 0.30–0.70)

## 2021-08-05 LAB — HIV ANTIBODY (ROUTINE TESTING W REFLEX): HIV Screen 4th Generation wRfx: NONREACTIVE

## 2021-08-05 LAB — HEPATITIS B SURFACE ANTIBODY,QUALITATIVE: Hep B S Ab: NONREACTIVE

## 2021-08-05 LAB — LIPID PANEL
Cholesterol: 97 mg/dL (ref 0–200)
HDL: 27 mg/dL — ABNORMAL LOW (ref >40)
LDL Cholesterol: 57 mg/dL (ref 0–99)
Total CHOL/HDL Ratio: 3.6 RATIO
Triglycerides: 63 mg/dL (ref <150)
VLDL: 13 mg/dL (ref 0–40)

## 2021-08-05 LAB — HEPATITIS C ANTIBODY: HCV Ab: NONREACTIVE

## 2021-08-05 LAB — HEPATITIS B CORE ANTIBODY, TOTAL: Hep B Core Total Ab: NONREACTIVE

## 2021-08-05 LAB — TSH: TSH: 3.124 u[IU]/mL (ref 0.350–4.500)

## 2021-08-05 LAB — GLUCOSE, CAPILLARY: Glucose-Capillary: 119 mg/dL — ABNORMAL HIGH (ref 70–99)

## 2021-08-05 MED ORDER — MIDODRINE HCL 5 MG PO TABS
10.0000 mg | ORAL_TABLET | Freq: Three times a day (TID) | ORAL | Status: DC
  Administered 2021-08-05 – 2021-08-10 (×14): 10 mg via ORAL
  Filled 2021-08-05 (×14): qty 2

## 2021-08-05 MED ORDER — ALLOPURINOL 100 MG PO TABS
200.0000 mg | ORAL_TABLET | Freq: Every day | ORAL | Status: DC
  Administered 2021-08-05 – 2021-08-10 (×6): 200 mg via ORAL
  Filled 2021-08-05 (×6): qty 2

## 2021-08-05 MED ORDER — DEXTROSE 50 % IV SOLN
1.0000 | Freq: Once | INTRAVENOUS | Status: AC
  Administered 2021-08-05: 50 mL via INTRAVENOUS
  Filled 2021-08-05: qty 50

## 2021-08-05 MED ORDER — METHYLPREDNISOLONE SODIUM SUCC 40 MG IJ SOLR
40.0000 mg | Freq: Every day | INTRAMUSCULAR | Status: AC
  Administered 2021-08-05: 40 mg via INTRAVENOUS
  Filled 2021-08-05: qty 1

## 2021-08-05 MED ORDER — RIFAXIMIN 550 MG PO TABS
550.0000 mg | ORAL_TABLET | Freq: Two times a day (BID) | ORAL | Status: DC
  Administered 2021-08-05 – 2021-08-10 (×10): 550 mg via ORAL
  Filled 2021-08-05 (×13): qty 1

## 2021-08-05 MED ORDER — ASPIRIN 81 MG PO CHEW
81.0000 mg | CHEWABLE_TABLET | Freq: Every day | ORAL | Status: DC
  Administered 2021-08-06 – 2021-08-10 (×5): 81 mg via ORAL
  Filled 2021-08-05 (×6): qty 1

## 2021-08-05 MED ORDER — HYDROCORTISONE 10 MG PO TABS
10.0000 mg | ORAL_TABLET | Freq: Two times a day (BID) | ORAL | Status: DC
  Administered 2021-08-05 – 2021-08-10 (×10): 10 mg via ORAL
  Filled 2021-08-05 (×13): qty 1

## 2021-08-05 MED ORDER — FERROUS SULFATE 325 (65 FE) MG PO TABS
325.0000 mg | ORAL_TABLET | Freq: Every day | ORAL | Status: DC
  Administered 2021-08-05 – 2021-08-10 (×4): 325 mg via ORAL
  Filled 2021-08-05 (×5): qty 1

## 2021-08-05 MED ORDER — LACTULOSE 10 GM/15ML PO SOLN
30.0000 g | Freq: Every day | ORAL | Status: DC
  Administered 2021-08-05 – 2021-08-06 (×2): 30 g via ORAL
  Filled 2021-08-05: qty 60
  Filled 2021-08-05 (×2): qty 45

## 2021-08-05 MED ORDER — HEPARIN SODIUM (PORCINE) 1000 UNIT/ML IJ SOLN
4000.0000 [IU] | Freq: Once | INTRAMUSCULAR | Status: AC
Start: 2021-08-05 — End: 2021-08-05
  Administered 2021-08-05: 4000 [IU] via INTRAVENOUS
  Filled 2021-08-05: qty 4

## 2021-08-05 MED ORDER — VITAMIN D 25 MCG (1000 UNIT) PO TABS
5000.0000 [IU] | ORAL_TABLET | Freq: Every day | ORAL | Status: DC
  Administered 2021-08-05 – 2021-08-10 (×4): 5000 [IU] via ORAL
  Filled 2021-08-05 (×4): qty 5

## 2021-08-05 MED ORDER — ASPIRIN 81 MG PO CHEW: 81.0000 mg | CHEWABLE_TABLET | Freq: Every day | ORAL | Status: DC

## 2021-08-05 MED ORDER — HEPARIN SODIUM (PORCINE) 1000 UNIT/ML IJ SOLN
INTRAMUSCULAR | Status: AC
  Filled 2021-08-05: qty 3

## 2021-08-05 MED ORDER — PANTOPRAZOLE SODIUM 40 MG PO TBEC
40.0000 mg | DELAYED_RELEASE_TABLET | Freq: Two times a day (BID) | ORAL | Status: DC
  Administered 2021-08-05 – 2021-08-10 (×11): 40 mg via ORAL
  Filled 2021-08-05 (×11): qty 1

## 2021-08-05 MED ORDER — HEPARIN BOLUS VIA INFUSION
1000.0000 [IU] | Freq: Once | INTRAVENOUS | Status: DC
  Filled 2021-08-05: qty 1000

## 2021-08-05 MED ORDER — CHLORHEXIDINE GLUCONATE CLOTH 2 % EX PADS: 6.0000 | MEDICATED_PAD | Freq: Every day | CUTANEOUS | Status: DC

## 2021-08-05 MED ORDER — LEVOTHYROXINE SODIUM 100 MCG PO TABS
100.0000 ug | ORAL_TABLET | Freq: Every day | ORAL | Status: DC
  Administered 2021-08-05 – 2021-08-10 (×6): 100 ug via ORAL
  Filled 2021-08-05 (×6): qty 1

## 2021-08-05 MED ORDER — DEXTROSE 50 % IV SOLN
25.0000 mL | INTRAVENOUS | Status: DC | PRN
  Administered 2021-08-05: 25 mL via INTRAVENOUS
  Filled 2021-08-05: qty 50

## 2021-08-05 NOTE — Progress Notes (Signed)
Received patient in bed to unit.  Alert and oriented.  Informed consent signed and in  chart.   Treatment initiated: 1356 Treatment completed: 1756  Patient tolerated well.  Transported back to the room  alert, without acute distress.  Hand-off given to patient's nurse.   Access used: Cath Access issues: n/a   Total UF removed: 3000 Medication(s) given: Heparin gtt increased to 16\hr Post HD VS: 111/66,107,100,26 Post HD weight: 105.2kg   [facility]

## 2021-08-05 NOTE — ED Notes (Signed)
Pt was given 4 oz of orange juice per order.

## 2021-08-05 NOTE — ED Notes (Signed)
Pt repositioned in bed for comfort.

## 2021-08-05 NOTE — Progress Notes (Signed)
ANTICOAGULATION CONSULT NOTE - Initial Consult  Pharmacy Consult for heparin Indication: chest pain/ACS and atrial fibrillation  Allergies  Allergen Reactions   Codeine     Headache    Diclofenac    Ibuprofen    Indomethacin    Meloxicam    Naproxen     Patient Measurements: Height: 5' 4"  (162.6 cm) Weight: 108.2 kg (238 lb 8.6 oz) IBW/kg (Calculated) : 59.2 Heparin Dosing Weight: 84 kg  Vital Signs: Temp: 97.5 F (36.4 C) (08/03 1344) BP: 104/66 (08/03 1603) Pulse Rate: 99 (08/03 1603)  Labs: Recent Labs    08/03/21 0707 08/04/21 1455 08/04/21 1755 08/05/21 0316 08/05/21 0829 08/05/21 1410  HGB 14.6 11.3*  --  11.2*  --   --   HCT 43.0 34.7*  --  34.8*  --   --   PLT  --  83*  --  74*  --   --   LABPROT  --   --   --  17.6*  --   --   INR  --   --   --  1.5*  --   --   HEPARINUNFRC  --   --   --  <0.10*  --  0.14*  CREATININE 7.10* 5.29*  --   --  5.93*  --   TROPONINIHS  --  1,082* 1,057*  --   --   --     Estimated Creatinine Clearance: 13.1 mL/min (A) (by C-G formula based on SCr of 5.93 mg/dL (H)).   Medical History: Past Medical History:  Diagnosis Date   Anemia    Anxiety    Asthma    CAD (coronary artery disease)    LHC 2/08:  mRCA 50%, o/w no sig CAD, EF 25%   Cardiomyopathy ([place])    EF 35-40% in 2/16 in setting of AF with RVR >> echo 5/16 with improved LVF with EF 65-70%   CHF (congestive heart failure) (HCC)    Chronic atrial fibrillation (HCC)    Chronic diastolic heart failure (HCC)    HFPF - EF ~65-70% (OFTEN EXACERBATED BY AFIB)   Cirrhosis of liver (HCC)    CKD (chronic kidney disease)    hx of worsening renal failure and hyperK+ in setting of acute diast CHF >> required CVVHD in 4/16 and dialysis x 1 in 5/16   CKD stage 3 due to type 2 diabetes mellitus ([place]) 09/17/2015   Colon cancer ([place])    Colon polyps 09/21/2012   Tubular adenoma   Diabetes (HCC)    GERD (gastroesophageal reflux disease)    Heart murmur    Hyperlipidemia     Hypertension    Hypothyroidism    Insomnia    Nonalcoholic steatohepatitis    Obesity hypoventilation syndrome ([place])    Pulmonary hypertension (HCC)    Renal insufficiency    Sleep apnea    Thrombocytopenia (HCC)      Assessment: 71 yo M on warfarin PTA for Afib now being held for NSTEMI work up. Troponins up to 1082.  Note hx of GIB 04/2021, thrombocytopenia likely due to NASH cirrhosis and ESRD on HD. Pharmacy consulted for heparin.    Heparin level 0.14 is subtherapeutic on 1400 units/hr. H/H stable, plt low stable. No reported bleeding.   Goal of Therapy:  Heparin level 0.3-0.7 units/ml Monitor platelets by anticoagulation protocol: Yes   Plan:  Give small heparin 1000 unit bolus and increase to 1600 units/hr F/u 6hr heparin level Monitor daily heparin level, CBC Monitor  for signs/symptoms of bleeding    Benetta Spar, PharmD, BCPS, [facility] Clinical Pharmacist  Please check AMION for all [place] phone numbers After 10:00 PM, call [place] [phone]

## 2021-08-05 NOTE — Progress Notes (Signed)
Progress Note  Patient Name: William Flores Date of Encounter: 08/05/2021  Primary Cardiologist:   Minus Breeding, MD   Subjective   He is complaining of abdominal pain and not chest pain.  No acute SOB although CXR looks worse today.    Inpatient Medications    Scheduled Meds:  aspirin EC  81 mg Oral Daily   atorvastatin  80 mg Oral Daily   sodium chloride flush  3 mL Intravenous Q12H   Continuous Infusions:  sodium chloride     sodium chloride     cefTRIAXone (ROCEPHIN)  IV     heparin 1,400 Units/hr (08/05/21 0457)   PRN Meds: sodium chloride, acetaminophen, nitroGLYCERIN, ondansetron (ZOFRAN) IV, sodium chloride flush   Vital Signs    Vitals:   08/05/21 0700 08/05/21 0715 08/05/21 0730 08/05/21 0745  BP: 112/76 109/70 102/67 110/71  Pulse: (!) 107 93 (!) 106 (!) 111  Resp: 12 12 12  (!) 24  Temp:      TempSrc:      SpO2: 100% 99% 100% 99%  Weight:      Height:        Intake/Output Summary (Last 24 hours) at 08/05/2021 0758 Last data filed at 08/04/2021 1931 Gross per 24 hour  Intake 100 ml  Output --  Net 100 ml   Filed Weights   08/04/21 1445  Weight: 111.6 kg    Telemetry    NA - Personally Reviewed  ECG    NA - Personally Reviewed  Physical Exam   GEN:    Looks uncomfortable but not in distress Neck:     Difficult to assess JVD Cardiac: Irregular RR, 2/6 apical murmur, rubs, or gallops.  Respiratory:      Decreased breath sounds GI: Soft, nontender, non-distended  MS:   Severe edema of the abdomen dependent areas and buttocks Neuro:  Nonfocal  Psych: Normal affect   Labs    Chemistry Recent Labs  Lab 08/03/21 0707 08/04/21 1455  NA 137 132*  K 4.1 4.1  CL 100 98  CO2  --  22  GLUCOSE 101* 85  BUN 56* 40*  CREATININE 7.10* 5.29*  CALCIUM  --  9.0  PROT  --  6.2*  ALBUMIN  --  2.9*  AST  --  27  ALT  --  15  ALKPHOS  --  69  BILITOT  --  1.1  GFRNONAA  --  11*  ANIONGAP  --  12     Hematology Recent Labs  Lab  08/03/21 0707 08/04/21 1455 08/05/21 0316  WBC  --  5.6 5.5  RBC  --  3.49* 3.49*  HGB 14.6 11.3* 11.2*  HCT 43.0 34.7* 34.8*  MCV  --  99.4 99.7  MCH  --  32.4 32.1  MCHC  --  32.6 32.2  RDW  --  19.2* 19.0*  PLT  --  83* 74*    Cardiac EnzymesNo results for input(s): "TROPONINI" in the last 168 hours. No results for input(s): "TROPIPOC" in the last 168 hours.   BNP Recent Labs  Lab 08/04/21 2208  BNP 1,412.6*     DDimer No results for input(s): "DDIMER" in the last 168 hours.   Radiology    DG Chest Port 1 View  Result Date: 08/04/2021 CLINICAL DATA:  Abdominal pain, dialysis due tomorrow EXAM: PORTABLE CHEST 1 VIEW COMPARISON:  Radiographs 05/27/2021 FINDINGS: Cardiomegaly. Patchy bilateral airspace and interstitial opacities suggestive of edema. Left basilar atelectasis/consolidation. Possible small bilateral  pleural effusions. Aortic calcification. Right IJ CVC tip in the right atrium. Partially visualized gaseous distention of the stomach. IMPRESSION: Slight interval worsening in bilateral airspace disease compatible with edema. Probable small bilateral pleural effusions. Electronically Signed   By: Placido Sou M.D.   On: 08/04/2021 18:28    Cardiac Studies   Echo pending.  Patient Profile     71 y.o. male with a hx of CAD, no PCI, paroxysmal afib, liver cirrhosis, chronic thrombocytopenia, severe pulm HTN, ESRD on dialysis, colon CA s/p resection, sleep apnea, prior GI bleed 04/2021 lives at a nursing facility  who is being seen 08/04/2021 for the evaluation of NSTEMI at the request of Dr. Melina Copa .  Assessment & Plan    NSTEMI:   I likely will suggest a cath before discharge but I would like to see his nausea and volume status improved prior to this.  He is not having acute ST elevation and not describing chest pain.  I think it is prudent to repeat an echo for new wall motion abnormality given his elevated troponin.   RV FAILURE:  Echo pending.     ATRIAL FIB:   Rate is elevated with discomfort.    HISTORY OF GI BLEED:    Hgb did fall with intial reading but stable over the last two.      ISCHEMIC CARDIOMYOPATHY:    CXR with some increased volume.  Nephrology has been consulted for dialysis.     For questions or updates, please contact [place] Please consult [email or web address] for contact info under Cardiology/STEMI.   Signed, Minus Breeding, MD  08/05/2021, 7:58 AM

## 2021-08-05 NOTE — Progress Notes (Signed)
Spoke with [facility] in [place]. She states that the estimated dry wt for pt is 103.5kg. Nurse will also fax over most recent Hep b status for pt.

## 2021-08-05 NOTE — Consult Note (Signed)
[facility] Nephrology Consult Note: Reason for Consult: To manage dialysis and dialysis related needs Referring Physician: Dr. Sloan Leiter, Shanker  HPI:  William Flores is an 71 y.o. male with past medical history significant for hypertension, DM, OSA, A-fib, NASH liver cirrhosis, thrombocytopenia, GI bleed, severe pulmonary hypertension with RV failure, chronic hypoxic respiratory failure on home oxygen, colon cancer status post resection, CAD, ESRD on HD TTS at [facility] who was transferred from [facility] for NSTEMI and possible cardiac cath, seen as a consultation for the management of dialysis.  Patient presented to the [facility] hospital after complaining of epigastric/chest pain with rising troponin level and nonspecific EKG.  Seen by cardiologist thought to be due to NSTEMI therefore transferred to [facility] for cardiac cath.  He was seen by cardiologist here, planning cardiac cath and also goals of care discussion because of multiple comorbidities.  He is currently on IV heparin.  For ESRD, patient reports going to [facility] in [place] and reportedly the last treatment was on Tuesday.  He does not know detail about his dry weight, UF during treatment etc. Currently he feels tired.  He denies nausea, vomiting, chest pain, shortness of breath.  He had recent right upper extremity AV graft placed by Dr. Donnetta Hutching.  Using right IJ [facility] for the access.  Current blood pressure 110/67, oxygen saturation 98% in 2 L of oxygen.  The labs showed sodium 132, potassium 5, BUN 48, BNP 1412, troponin I 1057, hemoglobin 11.2, platelets 74, WBC 5.5K. Chest x-ray compatible with edema and small bilateral pleural effusion. Echocardiogram in 05/2021 with EF of 60 to 65% severely reduced right ventricular systolic function.  Dialysis Orders from previous admission:  [facility], TTS 4hr EDW 108kg 400/500 2/2.5 T35 Heparin 0 Mircera 236mg q2weeks    Past Medical History:  Diagnosis Date    Anemia    Anxiety    Asthma    CAD (coronary artery disease)    LHC 2/08:  mRCA 50%, o/w no sig CAD, EF 25%   Cardiomyopathy ([place]    EF 35-40% in 2/16 in setting of AF with RVR >> echo 5/16 with improved LVF with EF 65-70%   CHF (congestive heart failure) (HCC)    Chronic atrial fibrillation (HCC)    Chronic diastolic heart failure (HCC)    HFPF - EF ~65-70% (OFTEN EXACERBATED BY AFIB)   Cirrhosis of liver (HCC)    CKD (chronic kidney disease)    hx of worsening renal failure and hyperK+ in setting of acute diast CHF >> required CVVHD in 4/16 and dialysis x 1 in 5/16   CKD stage 3 due to type 2 diabetes mellitus ([place] 09/17/2015   [place]on cancer ([place]    Colon polyps 09/21/2012   Tubular adenoma   Diabetes (HCC)    GERD (gastroesophageal reflux disease)    Heart murmur    Hyperlipidemia    Hypertension    Hypothyroidism    Insomnia    Nonalcoholic steatohepatitis    Obesity hypoventilation syndrome ([place]    Pulmonary hypertension ([place]    Renal insufficiency    Sleep apnea    Thrombocytopenia ([place]     Past Surgical History:  Procedure Laterality Date   AV FISTULA PLACEMENT Right 03/02/2021   Procedure: RIGHT ARM ARTERIOVENOUS (AV) FISTULA CREATION;  Surgeon: ERosetta Posner MD;  Location: [facility] ORS;  Service: Vascular;  Laterality: Right;   AV FISTULA PLACEMENT Right 08/03/2021   Procedure: INSERTION OF RIGHT UPPER ARM  ARTERIOVENOUS (AV) GORE-TEX GRAFT;  Surgeon: [name], MD;  Location: [facility] ORS;  Service: Vascular;  Laterality: Right;   [place]  05/2021   CIRCUMCISION     COLON RESECTION     COLONOSCOPY N/A 09/21/2012   Procedure: COLONOSCOPY;  Surgeon: Inda Castle, MD;  Location: [facility];  Service: Endoscopy;  Laterality: N/A;   COLONOSCOPY N/A 12/28/2018   Procedure: COLONOSCOPY;  Surgeon: Rogene Houston, MD;  Location: [facility];  Service: Endoscopy;  Laterality: N/A;   COLONOSCOPY WITH PROPOFOL N/A 10/17/2020    Procedure: COLONOSCOPY WITH PROPOFOL;  Surgeon: Harvel Quale, MD;  Location: [facility] END[facility];  Service: Gastroenterology;  Laterality: N/A;   COLONOSCOPY WITH PROPOFOL N/A 04/25/2021   Procedure: COLONOSCOPY WITH PROPOFOL;  Surgeon: Rogene Houston, MD;  Location: [facility];  Service: Endoscopy;  Laterality: N/A;   ENTEROSCOPY N/A 06/24/2020   Procedure: ENTEROSCOPY;  Surgeon: Gatha Mayer, MD;  Location: [facility] ENDOSCOPY;  Service: Endoscopy;  Laterality: N/A;   ESOPHAGOGASTRODUODENOSCOPY N/A 12/28/2018   Procedure: ESOPHAGOGASTRODUODENOSCOPY (EGD);  Surgeon: Rogene Houston, MD;  Location[facility];  Service: Endoscopy;  Laterality: N/A;   HOT HEMOSTASIS  04/25/2021   Procedure: HOT HEMOSTASIS (ARGON PLASMA COAGULATION/BICAP);  Surgeon: Rogene Houston, MD;  Location: [facility];  Service: Endoscopy;;   IR FLUORO GUIDE CV LINE RIGHT  12/10/2020   IR US GUIDE [place] RIGHT  12/11/2020   POLYPECTOMY  10/17/2020   Procedure: POLYPECTOMY;  Surgeon: Harvel Quale, MD;  Location: [facility];  Service: Gastroenterology;;  rectal, transverse, descending, ascending, sigmoid   RIGHT HEART CATH N/A 04/11/2019   Procedure: RIGHT HEART CATH;  Surgeon: Larey Dresser, MD;  Location: [facility];  Service: Cardiovascular;  Laterality: N/A;   US ECHOCARDIOGRAPHY  03/04/2010   abnormal study    Family History  Problem Relation Age of Onset   Breast cancer Mother    Diabetes Mother    Heart attack Father     Social History:  reports that he quit smoking about 38 years ago. His smoking use included cigarettes. He has never used smokeless tobacco. He reports that he does not drink alcohol and does not use drugs.  Allergies:  Allergies  Allergen Reactions   Codeine     Headache    Diclofenac    Ibuprofen    Indomethacin    Meloxicam    Naproxen     Medications: I have reviewed the patient's current medications.   Results for orders placed  or performed during the hospital encounter of 08/04/21 (from the past 48 hour(s))  CBC with Differential     Status: Abnormal   Collection Time: 08/04/21  2:55 PM  Result Value Ref Range   WBC 5.6 4.0 - 10.5 K/uL   RBC 3.49 (L) 4.22 - 5.81 MIL/uL   Hemoglobin 11.3 (L) 13.0 - 17.0 g/dL   HCT 34.7 (L) 39.0 - 52.0 %   MCV 99.4 80.0 - 100.0 fL   MCH 32.4 26.0 - 34.0 pg   MCHC 32.6 30.0 - 36.0 g/dL   RDW 19.2 (H) 11.5 - 15.5 %   Platelets 83 (L) 150 - 400 K/uL    Comment: SPECIMEN CHECKED FOR CLOTS Immature Platelet Fraction may be clinically indicated, consider ordering this additional test [id number] REPEATED TO VERIFY PLATELET COUNT CONFIRMED BY SMEAR    nRBC 0.4 (H) 0.0 - 0.2 %   Neutrophils Relative %  68 %   Neutro Abs 3.8 1.7 - 7.7 K/uL   Lymphocytes Relative 16 %   Lymphs Abs 0.9 0.7 - 4.0 K/uL   Monocytes Relative 14 %   Monocytes Absolute 0.8 0.1 - 1.0 K/uL   Eosinophils Relative 0 %   Eosinophils Absolute 0.0 0.0 - 0.5 K/uL   Basophils Relative 1 %   Basophils Absolute 0.0 0.0 - 0.1 K/uL   WBC Morphology MORPHOLOGY UNREMARKABLE    RBC Morphology MORPHOLOGY UNREMARKABLE    Smear Review PLATELET COUNT CONFIRMED BY SMEAR    Immature Granulocytes 1 %   Abs Immature Granulocytes 0.04 0.00 - 0.07 K/uL    Comment: Performed at [facility], [address] [place], [place] [address]  Comprehensive metabolic panel     Status: Abnormal   Collection Time: 08/04/21  2:55 PM  Result Value Ref Range   Sodium 132 (L) 135 - 145 mmol/L   Potassium 4.1 3.5 - 5.1 mmol/L   Chloride 98 98 - 111 mmol/L   CO2 22 22 - 32 mmol/L   Glucose, Bld 85 70 - 99 mg/dL    Comment: Glucose reference range applies only to samples taken after fasting for at least 8 hours.   BUN 40 (H) 8 - 23 mg/dL   Creatinine, Ser 5.29 (H) 0.61 - 1.24 mg/dL   Calcium 9.0 8.9 - 10.3 mg/dL   Total Protein 6.2 (L) 6.5 - 8.1 g/dL   Albumin 2.9 (L) 3.5 - 5.0 g/dL   AST 27 15 - 41 U/L   ALT 15 0 - 44 U/L   Alkaline  Phosphatase 69 38 - 126 U/L   Total Bilirubin 1.1 0.3 - 1.2 mg/dL   GFR, Estimated 11 (L) >60 mL/min    Comment: (NOTE) Calculated using the CKD-EPI Creatinine Equation (2021)    Anion gap 12 5 - 15    Comment: Performed at [facility], [address], [place], [place] [address]  Lipase, blood     Status: None   Collection Time: 08/04/21  2:55 PM  Result Value Ref Range   Lipase 18 11 - 51 U/L    Comment: Performed at [facility], [address], [place], [place] [address]  Troponin I (High Sensitivity)     Status: Abnormal   Collection Time: 08/04/21  2:55 PM  Result Value Ref Range   Troponin I (High Sensitivity) 1,082 (HH) <18 ng/L    Comment: CRITICAL RESULT CALLED TO, READ BACK BY AND VERIFIED WITH: WALL,Y ON 08/04/21 AT 1650 BY LOY,C (NOTE) Elevated high sensitivity troponin I (hsTnI) values and significant  changes across serial measurements may suggest ACS but many other  chronic and acute conditions are known to elevate hsTnI results.  Refer to the Links section for chest pain algorithms and additional  guidance. Performed at [facility], [address] [place], [place] [address]   Urinalysis, Routine w reflex microscopic Urine, In & Out Cath     Status: Abnormal   Collection Time: 08/04/21  3:50 PM  Result Value Ref Range   Color, Urine BROWN (A) YELLOW    Comment: BIOCHEMICALS MAY BE AFFECTED BY COLOR   APPearance TURBID (A) CLEAR   Specific Gravity, Urine 1.025 1.005 - 1.030   pH 6.5 5.0 - 8.0   Glucose, UA 100 (A) NEGATIVE mg/dL   Hgb urine dipstick LARGE (A) NEGATIVE   Bilirubin Urine MODERATE (A) NEGATIVE   Ketones, ur 15 (A) NEGATIVE mg/dL   Protein, ur >300 (A) NEGATIVE  mg/dL   Nitrite POSITIVE (A) NEGATIVE   Leukocytes,Ua LARGE (A) NEGATIVE    Comment: Performed at [facility], [address] [place], [place] [address]  Urinalysis, Microscopic (reflex)     Status: Abnormal   Collection Time: 08/04/21  3:50 PM  Result Value Ref Range   RBC / HPF >50 0 -  5 RBC/hpf   WBC, UA >50 0 - 5 WBC/hpf   Bacteria, UA FEW (A) NONE SEEN   Squamous Epithelial / LPF 0-5 0 - 5   WBC Clumps PRESENT     Comment: Performed at [facility], [address] [place], [place] [address]  Troponin I (High Sensitivity)     Status: Abnormal   Collection Time: 08/04/21  5:55 PM  Result Value Ref Range   Troponin I (High Sensitivity) 1,057 (HH) <18 ng/L    Comment: DELTA CHECK NOTED CRITICAL RESULT CALLED TO, READ BACK BY AND VERIFIED WITH: BELTON.C ON 08/04/21 AT 1940 BY LOY,C (NOTE) Elevated high sensitivity troponin I (hsTnI) values and significant  changes across serial measurements may suggest ACS but many other  chronic and acute conditions are known to elevate hsTnI results.  Refer to the Links section for chest pain algorithms and additional  guidance. Performed at [facility], [address], [place], [place] [address]   Brain natriuretic peptide     Status: Abnormal   Collection Time: 08/04/21 10:08 PM  Result Value Ref Range   B Natriuretic Peptide 1,412.6 (H) 0.0 - 100.0 pg/mL    Comment: Performed at [facility] [address] [place], [place] [address]  Magnesium     Status: None   Collection Time: 08/04/21 10:08 PM  Result Value Ref Range   Magnesium 2.0 1.7 - 2.4 mg/dL    Comment: Performed at [facility], [place] [address] [place], [place] [address]  Hemoglobin A1c     Status: None   Collection Time: 08/04/21 10:08 PM  Result Value Ref Range   Hgb A1c MFr Bld 5.4 4.8 - 5.6 %    Comment: (NOTE) Pre diabetes:          5.7%-6.4%  Diabetes:              >6.4%  Glycemic control for   <7.0% adults with diabetes    Mean Plasma Glucose 108.28 mg/dL    Comment: Performed at [facility] [address] [place], [place] [address]  TSH     Status: None   Collection Time: 08/05/21  3:16 AM  Result Value Ref Range   TSH 3.124 0.350 - 4.500 uIU/mL    Comment: Performed by a 3rd Generation assay with a functional sensitivity  of <=0.01 uIU/mL. Performed at [facility], [place] [address] [place], [place] [address]   Lipid panel     Status: Abnormal   Collection Time: 08/05/21  3:16 AM  Result Value Ref Range   Cholesterol 97 0 - 200 mg/dL   Triglycerides 63 <150 mg/dL   HDL 27 (L) >40 mg/dL   Total CHOL/HDL Ratio 3.6 RATIO   VLDL 13 0 - 40 mg/dL   LDL Cholesterol 57 0 - 99 mg/dL    Comment:        Total Cholesterol/HDL:CHD Risk Coronary Heart Disease Risk Table                     Men   Women  1/2 Average Risk   3.4  3.3  Average Risk       5.0   4.4  2 X Average Risk   9.6   7.1  3 X Average Risk  23.4   11.0        Use the calculated Patient Ratio above and the CHD Risk Table to determine the patient's CHD Risk.        ATP III CLASSIFICATION (LDL):  <100     mg/dL   Optimal  100-129  mg/dL   Near or Above                    Optimal  130-159  mg/dL   Borderline  160-189  mg/dL   High  >190     mg/dL   Very High Performed at [facility] [address] [place], [place] [address]   Protime-INR     Status: Abnormal   Collection Time: 08/05/21  3:16 AM  Result Value Ref Range   Prothrombin Time 17.6 (H) 11.4 - 15.2 seconds   INR 1.5 (H) 0.8 - 1.2    Comment: (NOTE) INR goal varies based on device and disease states. Performed at [facility], [place] [address] [place], [place] [address]   Heparin level (unfractionated)     Status: Abnormal   Collection Time: 08/05/21  3:16 AM  Result Value Ref Range   Heparin Unfractionated <0.10 (L) 0.30 - 0.70 IU/mL    Comment: (NOTE) The clinical reportable range upper limit is being lowered to >1.10 to align with the FDA approved guidance for the current laboratory assay.  If heparin results are below expected values, and patient dosage has  been confirmed, suggest follow up testing of antithrombin III levels. Performed at [facility], [place] [address], [place], [place] [address]   CBC     Status: Abnormal   Collection  Time: 08/05/21  3:16 AM  Result Value Ref Range   WBC 5.5 4.0 - 10.5 K/uL   RBC 3.49 (L) 4.22 - 5.81 MIL/uL   Hemoglobin 11.2 (L) 13.0 - 17.0 g/dL   HCT 34.8 (L) 39.0 - 52.0 %   MCV 99.7 80.0 - 100.0 fL   MCH 32.1 26.0 - 34.0 pg   MCHC 32.2 30.0 - 36.0 g/dL   RDW 19.0 (H) 11.5 - 15.5 %   Platelets 74 (L) 150 - 400 K/uL    Comment: Immature Platelet Fraction may be clinically indicated, consider ordering this additional test [id number] REPEATED TO VERIFY    nRBC 0.4 (H) 0.0 - 0.2 %    Comment: Performed at [facility], [place] [address], [place], [place] [address]  CBG monitoring, ED     Status: Abnormal   Collection Time: 08/05/21  5:07 AM  Result Value Ref Range   Glucose-Capillary 63 (L) 70 - 99 mg/dL    Comment: Glucose reference range applies only to samples taken after fasting for at least 8 hours.  Basic metabolic panel     Status: Abnormal   Collection Time: 08/05/21  8:29 AM  Result Value Ref Range   Sodium 132 (L) 135 - 145 mmol/L   Potassium 5.0 3.5 - 5.1 mmol/L    Comment: HEMOLYSIS AT THIS LEVEL MAY AFFECT RESULT   Chloride 97 (L) 98 - 111 mmol/L   CO2 21 (L) 22 - 32 mmol/L   Glucose, Bld 74 70 - 99 mg/dL    Comment: Glucose reference range applies only to samples taken  after fasting for at least 8 hours.   BUN 48 (H) 8 - 23 mg/dL   Creatinine, Ser 5.93 (H) 0.61 - 1.24 mg/dL   Calcium 8.8 (L) 8.9 - 10.3 mg/dL   GFR, Estimated 10 (L) >60 mL/min    Comment: (NOTE) Calculated using the CKD-EPI Creatinine Equation (2021)    Anion gap 14 5 - 15    Comment: Performed at [facility] [address] [place], [place] [address]  CBG monitoring, ED     Status: None   Collection Time: 08/05/21  9:14 AM  Result Value Ref Range   Glucose-Capillary 75 70 - 99 mg/dL    Comment: Glucose reference range applies only to samples taken after fasting for at least 8 hours.    DG Chest Port 1 View  Result Date: 08/04/2021 CLINICAL DATA:  Abdominal pain, dialysis due  tomorrow EXAM: PORTABLE CHEST 1 VIEW COMPARISON:  Radiographs 05/27/2021 FINDINGS: Cardiomegaly. Patchy bilateral airspace and interstitial opacities suggestive of edema. Left basilar atelectasis/consolidation. Possible small bilateral pleural effusions. Aortic calcification. Right IJ CVC tip in the right atrium. Partially visualized gaseous distention of the stomach. IMPRESSION: Slight interval worsening in bilateral airspace disease compatible with edema. Probable small bilateral pleural effusions. Electronically Signed   By: Placido Sou M.D.   On: 08/04/2021 18:28    ROS: As per H&P.  Rest of the systems reviewed and are negative. Blood pressure 110/67, pulse 99, temperature 98.7 F (37.1 C), resp. rate 16, height 5' 4"  (1.626 m), weight 111.6 kg, SpO2 98 %. Gen: Ill-looking male, able to lie flat. Respiratory: Clear bilateral, no wheezing or crackle Cardiovascular: Regular rate rhythm S1-S2 normal, no rubs GI: Abdomen soft,  nondistended Extremities, no cyanosis or clubbing, no edema Skin: No rash or ulcer Neurology: Alert, awake, following commands, oriented Dialysis Access: Right upper extremity AV graft wound is healing, thrill and bruit present.  Right IJ TDC intact.  Assessment/Plan:  #NSTEMI/chest pain: Seen by cardiologist and currently on medical management including aspirin, statin, beta-blocker and IV heparin.  Cardiology team is planning left heart cath.  # ESRD: TTS at [facility].  We will plan regular dialysis today.  UF as tolerated.  Try to obtain records from outpatient center.  Right IJ for the access.  #Dialysis Access: Using right IJ TDC.  He had right upper extremity AV graft placed by Dr. Donnetta Hutching on 8/1.  The wound healing good.  #Volume/blood pressure: Blood pressure acceptable.  On midodrine for hypotension.  # Anemia of ESRD: Hemoglobin at goal.  No need for ESA.  # Metabolic Bone Disease: Monitor calcium and phosphorus level.  Thank you for the consult.  We  will follow with you.  Lacresha Fusilier Tanna Furry 08/05/2021, 10:59 AM

## 2021-08-05 NOTE — Progress Notes (Addendum)
PROGRESS NOTE        PATIENT DETAILS Name: William Flores Age: 71 y.o. Sex: male Date of Birth: [birth year] Admit Date: 08/04/2021 Admitting Physician Clance Boll, MD SJG:[id number], [name], MD  Brief Summary: Patient is a 71 y.o.  male with a history of ESRD on HD TTS, chronic hypoxic respiratory failure on home O2, pulmonary hypertension, [place] liver cirrhosis, DM-2 who presented with epigastric discomfort-found to have non-STEMI and subsequently admitted to the hospitalist service.  See below for further details.  Significant events: 8/2>> admit to [facility]-epigastric pain-non-STEMI.  Significant studies: 8/2>> CXR:  Pulmonary edema.  Significant microbiology data:   Procedures:   Consults: Cardiology, nephrology.Pall care  Subjective: Feels better-continues to have minimal epigastric discomfort this morning.  Awake/alert and not in any distress.  Objective: Vitals: Blood pressure 105/67, pulse 100, temperature 98.7 F (37.1 C), resp. rate 20, height 5' 4"  (1.626 m), weight 111.6 kg, SpO2 100 %.   Exam: Gen Exam:Alert awake-not in any distress HEENT:atraumatic, normocephalic Chest: B/L clear to auscultation anteriorly CVS:S1S2 regular Abdomen:soft non tender, non distended Extremities: Chronic venous stasis changes to both legs bilaterally. Neurology: Non focal Skin: no rash  Pertinent Labs/Radiology:    Latest Ref Rng & Units 08/05/2021    3:16 AM 08/04/2021    2:55 PM 08/03/2021    7:07 AM  CBC  WBC 4.0 - 10.5 K/uL 5.5  5.6    Hemoglobin 13.0 - 17.0 g/dL 11.2  11.3  14.6   Hematocrit 39.0 - 52.0 % 34.8  34.7  43.0   Platelets 150 - 400 K/uL 74  83      Lab Results  Component Value Date   NA 132 (L) 08/05/2021   K 5.0 08/05/2021   CL 97 (L) 08/05/2021   CO2 21 (L) 08/05/2021      Assessment/Plan: Non-STEMI: No chest pain-some minimal epigastric pain continues-being medically managed currently with  aspirin/statin/beta-blocker/IV heparin--cardiology following-LHC being contemplated.  We will defer further to cardiology.  ESRD on HD TTS: Nephrology following-we will defer further to nephrology.  Fluid overload due to ESRD/acute on chronic RV failure/acute on chronic HFpEF: Volume removal with HD.    Chronic atrial fibrillation: Rate controlled-Coumadin held-on IV heparin.  NASH cirrhosis: Relatively well compensated-follow closely.  History of hepatic encephalopathy: Currently alert/awake-continue rifaximin/lactulose  Chronic hypoxic respiratory failure on home O2-2-3 L/min: Continue O2 at home regimen.  ?  Adrenal insufficiency: Appears to be on Solu-Cortef since his last hospitalization at [facility]-this will be continued.  ?  UTI: No symptoms-but poor historian-immunocompromised-appears to be on chronic steroids-continue Rocephin for now.  Hypoglycemia: Unclear etiology-due to possible chronic adrenal insufficiency (on Solu-Cortef)- and poor oral intake as n.p.o.-resuming Solu-Cortef-follow CBGs.  Chronic hypotension: BP soft-continue midodrine.  Hypothyroidism: Continue Synthroid  Gout: No flare-continue allopurinol.  GERD: Continue PPI  Palliative care: Spoke to patient's daughter at length-was a DNR during his most recent hospitalization in [facility].  Apparently has been told that his overall prognosis is poor-hospice has been following him at [facility] in [place].  Patient currently wishes to be a full code.  Daughter is aware of poor prognosis-and is very hesitant to consider aggressive care given his multiple medical comorbidities.  I spoke with cardiologist-Dr. Rudie Meyer both agreed that it is best to manage him medically-with no plans to pursue LHC-I will go ahead and get  a palliative care consult to see him.  Patient best benefits from general medical treatment-and being hooked up with hospice/palliative care in the outpatient setting.  We will continue to work on  goals of care over the next few days.  Morbid Obesity: Estimated body mass index is 42.23 kg/m as calculated from the following:   Height as of this encounter: 5' 4"  (1.626 m).   Weight as of this encounter: 111.6 kg.   Code status:   Code Status: Full Code   DVT Prophylaxis: IV heparin.  Family Communication: Daughter-[name]-updated over the phone on 8/3.   Disposition Plan: Status is: Inpatient Remains inpatient appropriate because: Non-STEMI-see above.   Planned Discharge Destination:Skilled nursing facility   Diet: Diet Order             Diet renal with fluid restriction Fluid restriction: 1200 mL Fluid; Room service appropriate? Yes; Fluid consistency: Thin  Diet effective now                     Antimicrobial agents: Anti-infectives (From admission, onward)    Start     Dose/Rate Route Frequency Ordered Stop   08/05/21 1800  cefTRIAXone (ROCEPHIN) 1 g in sodium chloride 0.9 % 100 mL IVPB        1 g 200 mL/hr over 30 Minutes Intravenous Every 24 hours 08/04/21 2314     08/04/21 1815  cefTRIAXone (ROCEPHIN) 1 g in sodium chloride 0.9 % 100 mL IVPB        1 g 200 mL/hr over 30 Minutes Intravenous  Once 08/04/21 1808 08/04/21 1931        MEDICATIONS: Scheduled Meds:  atorvastatin  80 mg Oral Daily   Continuous Infusions:  cefTRIAXone (ROCEPHIN)  IV     heparin 1,400 Units/hr (08/05/21 0457)   PRN Meds:.acetaminophen, nitroGLYCERIN, ondansetron (ZOFRAN) IV   I have personally reviewed following labs and imaging studies  LABORATORY DATA: CBC: Recent Labs  Lab 08/03/21 0707 08/04/21 1455 08/05/21 0316  WBC  --  5.6 5.5  NEUTROABS  --  3.8  --   HGB 14.6 11.3* 11.2*  HCT 43.0 34.7* 34.8*  MCV  --  99.4 99.7  PLT  --  83* 74*    Basic Metabolic Panel: Recent Labs  Lab 08/03/21 0707 08/04/21 1455 08/04/21 2208 08/05/21 0829  NA 137 132*  --  132*  K 4.1 4.1  --  5.0  CL 100 98  --  97*  CO2  --  22  --  21*   GLUCOSE 101* 85  --  74  BUN 56* 40*  --  48*  CREATININE 7.10* 5.29*  --  5.93*  CALCIUM  --  9.0  --  8.8*  MG  --   --  2.0  --     GFR: Estimated Creatinine Clearance: 13.3 mL/min (A) (by C-G formula based on SCr of 5.93 mg/dL (H)).  Liver Function Tests: Recent Labs  Lab 08/04/21 1455  AST 27  ALT 15  ALKPHOS 69  BILITOT 1.1  PROT 6.2*  ALBUMIN 2.9*   Recent Labs  Lab 08/04/21 1455  LIPASE 18   No results for input(s): "AMMONIA" in the last 168 hours.  Coagulation Profile: Recent Labs  Lab 08/05/21 0316  INR 1.5*    Cardiac Enzymes: No results for input(s): "CKTOTAL", "CKMB", "CKMBINDEX", "TROPONINI" in the last 168 hours.  BNP (last 3 results) No results for input(s): "PROBNP" in the last 8760 hours.  Lipid Profile: Recent Labs    08/05/21 0316  CHOL 97  HDL 27*  LDLCALC 57  TRIG 63  CHOLHDL 3.6    Thyroid Function Tests: Recent Labs    08/05/21 0316  TSH 3.124    Anemia Panel: No results for input(s): "VITAMINB12", "FOLATE", "FERRITIN", "TIBC", "IRON", "RETICCTPCT" in the last 72 hours.  Urine analysis:    Component Value Date/Time   COLORURINE BROWN (A) 08/04/2021 1550   APPEARANCEUR TURBID (A) 08/04/2021 1550   LABSPEC 1.025 08/04/2021 1550   PHURINE 6.5 08/04/2021 1550   GLUCOSEU 100 (A) 08/04/2021 1550   HGBUR LARGE (A) 08/04/2021 1550   BILIRUBINUR MODERATE (A) 08/04/2021 1550   KETONESUR 15 (A) 08/04/2021 1550   PROTEINUR >300 (A) 08/04/2021 1550   UROBILINOGEN 0.2 05/16/2014 0822   NITRITE POSITIVE (A) 08/04/2021 1550   LEUKOCYTESUR LARGE (A) 08/04/2021 1550    Sepsis Labs: Lactic Acid, Venous    Component Value Date/Time   LATICACIDVEN 1.7 05/04/2021 1228    MICROBIOLOGY: No results found for this or any previous visit (from the past 240 hour(s)).  RADIOLOGY STUDIES/RESULTS: DG Chest Port 1 View  Result Date: 08/04/2021 CLINICAL DATA:  Abdominal pain, dialysis due tomorrow EXAM: PORTABLE CHEST 1 VIEW  COMPARISON:  Radiographs 05/27/2021 FINDINGS: Cardiomegaly. Patchy bilateral airspace and interstitial opacities suggestive of edema. Left basilar atelectasis/consolidation. Possible small bilateral pleural effusions. Aortic calcification. Right IJ CVC tip in the right atrium. Partially visualized gaseous distention of the stomach. IMPRESSION: Slight interval worsening in bilateral airspace disease compatible with edema. Probable small bilateral pleural effusions. Electronically Signed   By: Placido Sou M.D.   On: 08/04/2021 18:28     LOS: 1 day   Oren Binet, MD  [facility]    To contact the attending provider between 7A-7P or the covering provider during after hours 7P-7A, please log into the web site [email or web address] and access using universal [place] password for that web site. If you do not have the password, please call the hospital operator.  08/05/2021, 10:03 AM

## 2021-08-05 NOTE — Progress Notes (Signed)
ANTICOAGULATION CONSULT NOTE - Follow Up Consult  Pharmacy Consult for heparin Indication:  NSTEMI and Afib  Labs: Recent Labs    08/03/21 0707 08/04/21 1455 08/04/21 1755 08/05/21 0316  HGB 14.6 11.3*  --  11.2*  HCT 43.0 34.7*  --  34.8*  PLT  --  83*  --  74*  LABPROT  --   --   --  17.6*  INR  --   --   --  1.5*  HEPARINUNFRC  --   --   --  <0.10*  CREATININE 7.10* 5.29*  --   --   TROPONINIHS  --  1,082* 1,057*  --     Assessment: 71yo male subtherapeutic on heparin with initial dosing while Coumadin (Afib) on hold for ACS workup; no infusion issues or signs of bleeding per RN; Plt low, trending down ~10%, known h/o thrombocytopenia.  Goal of Therapy:  Heparin level 0.3-0.7 units/ml   Plan:  Will increase heparin infusion by 4 units/kg/hr to 1400 units/hr and check level in 8 hours.    Wynona Neat, PharmD, BCPS  08/05/2021,4:56 AM

## 2021-08-05 NOTE — Progress Notes (Addendum)
ANTICOAGULATION CONSULT NOTE  Pharmacy Consult for heparin Indication: chest pain/ACS and atrial fibrillation  Allergies  Allergen Reactions   Codeine     Headache    Diclofenac    Ibuprofen    Indomethacin    Meloxicam    Naproxen     Patient Measurements: Height: 5' 4"  (162.6 cm) Weight: 105.2 kg (231 lb 14.8 oz) IBW/kg (Calculated) : 59.2 Heparin Dosing Weight: 84 kg  Vital Signs: Temp: 97.9 F (36.6 C) (08/03 2029) Temp Source: Oral (08/03 2029) BP: 100/56 (08/03 2029) Pulse Rate: 98 (08/03 2033)  Labs: Recent Labs    08/03/21 0707 08/04/21 1455 08/04/21 1755 08/05/21 0316 08/05/21 0829 08/05/21 1410 08/05/21 1941  HGB 14.6 11.3*  --  11.2*  --   --   --   HCT 43.0 34.7*  --  34.8*  --   --   --   PLT  --  83*  --  74*  --   --   --   LABPROT  --   --   --  17.6*  --   --   --   INR  --   --   --  1.5*  --   --   --   HEPARINUNFRC  --   --   --  <0.10*  --  0.14* 0.28*  CREATININE 7.10* 5.29*  --   --  5.93*  --  3.73*  TROPONINIHS  --  1,082* 1,057*  --   --   --   --      Estimated Creatinine Clearance: 20.5 mL/min (A) (by C-G formula based on SCr of 3.73 mg/dL (H)).   Medical History: Past Medical History:  Diagnosis Date   Anemia    Anxiety    Asthma    CAD (coronary artery disease)    LHC 2/08:  mRCA 50%, o/w no sig CAD, EF 25%   Cardiomyopathy ([place])    EF 35-40% in 2/16 in setting of AF with RVR >> echo 5/16 with improved LVF with EF 65-70%   CHF (congestive heart failure) (HCC)    Chronic atrial fibrillation (HCC)    Chronic diastolic heart failure (HCC)    HFPF - EF ~65-70% (OFTEN EXACERBATED BY AFIB)   Cirrhosis of liver (HCC)    CKD (chronic kidney disease)    hx of worsening renal failure and hyperK+ in setting of acute diast CHF >> required CVVHD in 4/16 and dialysis x 1 in 5/16   CKD stage 3 due to type 2 diabetes mellitus ([place]) 09/17/2015   Colon cancer ([place])    Colon polyps 09/21/2012   Tubular adenoma   Diabetes (HCC)     GERD (gastroesophageal reflux disease)    Heart murmur    Hyperlipidemia    Hypertension    Hypothyroidism    Insomnia    Nonalcoholic steatohepatitis    Obesity hypoventilation syndrome ([place])    Pulmonary hypertension (HCC)    Renal insufficiency    Sleep apnea    Thrombocytopenia (HCC)      Assessment: 71 yo M on warfarin PTA for Afib now being held for NSTEMI work up. Troponins up to 1082.  Note hx of GIB 04/2021, thrombocytopenia likely due to NASH cirrhosis and ESRD on HD. Pharmacy consulted for heparin.    PM update - Heparin drip rate adjusted at 1826 and level drawn at [place] (only a 1-hr level). Confirmed with RN small bolus from earlier was not given. H/H stable, plt low  down a bit to 74. No reported bleeding.   Goal of Therapy:  Heparin level 0.3-0.7 units/ml Monitor platelets by anticoagulation protocol: Yes   Plan:  Will d/c previously ordered bolus (not yet given) with heparin level up to 0.28 only 1 hr after rate change to 1600 units/hr Enter 8hr heparin level timed from last rate change Monitor daily CBC, signs/symptoms of bleeding - watch plt trend closely   Arturo Morton, PharmD, BCPS Please check AMION for all [place] contact numbers Clinical Pharmacist 08/05/2021 9:42 PM  ADDENDUM - Heparin level timed appropriately resulted therapeutic at bottom of range at 0.3. No bleeding or issues with infusion per discussion with RN.  Plan: Increase heparin infusion slightly to 1650 units/hr to ensure stays in range Monitor daily heparin level and CBC, s/sx bleeding   Arturo Morton, PharmD, BCPS Please check AMION for all [place] contact numbers Clinical Pharmacist 08/06/2021 5:25 AM

## 2021-08-05 NOTE — Consult Note (Signed)
Consultation Note Date: 08/06/2021   Patient Name: William Flores  DOB: Jun 13, 1950  MRN: [id number]  Age / Sex: 71 y.o., male  PCP: [name], [name], MD Referring Physician: Jonetta Osgood, MD  Reason for Consultation: Establishing goals of care  HPI/Patient Profile: 71 y.o. male  with past medical history of OSA, P afib on coumadin, NASH liver cirrhosis ,thrombocytopenia, with history of GI bleed, severe pulmonary HTN with hx of RV failure, chronic hypoxic respiratory failure on 2-3L home 02, DMII,ESRD TTS ,hypothyroidism,colon CA s/p resection, as well as  hx non obstructive CAD on cath 2008 admitted on 08/04/2021 with abdominal and epigastric  pain/ chest discomfort.   Patient was transferred from [facility] with NSTEMI, currently being treated for ACS and with possible left heart cath upcoming.  Patient reportedly on hospice at [facility].  PMT has been consulted to assist with goals of care conversation.  Clinical Assessment and Goals of Care:  I have reviewed medical records including EPIC notes, labs and imaging, assessed the patient and then had a phone conversation with daughter William Flores to discuss diagnosis prognosis, [place], EOL wishes, disposition and options.  I introduced Palliative Medicine as specialized medical care for people living with serious illness. It focuses on providing relief from the symptoms and stress of a serious illness. The goal is to improve quality of life for both the patient and the family.  We discussed a brief life review of the patient and then focused on their current illness.  The natural disease trajectory and expectations at EOL were discussed.  I attempted to elicit values and goals of care important to the patient.    Medical History Review and Understanding:  Discussed patient's several comorbidities with his daughter, including end-stage renal disease and now NSTEMI.  She has a  good understanding of the severity of patient's illness and has been in discussions with hospice about overall poor prognosis.  Social History: His daughter shares that patient has 1 daughter and 1 son, although his son is minimally involved with a difficult past relationship.  They have not spoken since his admission to SNF.  He currently resides at [facility].  He is supported by [facility] hospice.  Functional and Nutritional State: William Flores tells me that patient has had a drastic decline, noting that Monday was the best day he has had in the long time.  We discussed that this is likely a rally and patient is nearing end-of-life.  Palliative Symptoms: Patient denies symptoms at this time  Advance Directives: A detailed discussion regarding advanced directives was had.  His daughter William Flores is primary HCPOA, with son William Flores listed as an alternative.   Code Status: Per chart, patient wishes to be a full code after previously having DNR order during his most recent hospitalization.  Discussed with hospice representative, who confirms he revoked his DNR but was making progress towards reinstating this.  Discussion: Attempted to have goals of care discussion with patient at the bedside, however he tells me he is too tired today and does not want to speak further.  He is agreeable to another attempt tomorrow and gave me permission to call his daughter to continue the conversation.  During my phone call with William Flores, she describes the discussions she has had with the hospice nurse and chaplain regarding his recent decline and poor prognosis.  She is very realistic and accepting that his time is very limited, although this is difficult and emotional for her.  She also wants to honor  his wishes if he is requesting aggressive care.  She had no idea he was brought to the hospital, as SNF did not notify her.  She notes he has refused his 2 most recent cardiology appointments.  Offered to arrange a family meeting to  further discuss long-term goals with the patient in the coming days given his fatigue today.  She tells me she has had this conversation many times before.  She also lives an hour away with difficulty coming to the hospital, as she cares for her 5 children.  She is agreeable to being on the phone with PMT and the patient if that would be helpful during these conversations.  She also wants to be called if anything drastic were to happen.  Overall, his daughter feels like he is scared about transitioning to end-of-life and this is impacting his decision-making at this time.  We discussed whether he has capacity and will continue to ensure his understanding.  I then spoke with hospice Representative Anderson Malta to provide updates on the above conversations and current care plan with daughters permission.   The difference between aggressive medical intervention and comfort care was considered in light of the patient's goals of care.   Discussed the importance of continued conversation with family and the medical providers regarding overall plan of care and treatment options, ensuring decisions are within the context of the patient's values and GOCs.   Questions and concerns were addressed. The family was encouraged to call with questions or concerns.  PMT will continue to support holistically.    SUMMARY OF RECOMMENDATIONS   -Continue full code/full scope treatment for now -Patient is too fatigued for goals of care conversation today, will reattempt tomorrow -Patient's daughter is realistic about poor prognosis and supportive of patient's wishes/preferences for how to proceed -Per [facility] hospice representative Anderson Malta, they may need to discharge him from hospice and re-enroll at discharge if he is still interested -Psychosocial emotional support provided -PMT will continue to follow and support  Prognosis:  < 6 months  Discharge Planning: To Be Determined      Primary Diagnoses: Present on  Admission:  NSTEMI (non-ST elevated myocardial infarction) [facility])   Physical Exam Vitals and nursing note reviewed.  Constitutional:      General: He is sleeping. He is not in acute distress.    Appearance: He is ill-appearing.     Interventions: Nasal cannula in place.  Cardiovascular:     Rate and Rhythm: Normal rate.  Pulmonary:     Effort: Pulmonary effort is normal.  Neurological:     Mental Status: He is easily aroused.  Psychiatric:        Mood and Affect: Mood normal.        Behavior: Behavior normal.     Vital Signs: BP (!) 120/36 (BP Location: Left Wrist)   Pulse 100   Temp 97.9 F (36.6 C) (Oral)   Resp 14   Ht 5' 4"  (1.626 m)   Wt 105.2 kg   SpO2 100%   BMI 39.81 kg/m  Pain Scale: 0-10   Pain Score: 0-No pain   SpO2: SpO2: 100 % O2 Device:SpO2: 100 % O2 Flow Rate: .O2 Flow Rate (L/min): 3 L/min   Palliative Assessment/Data:    MDM: High   Danial Sisley Johnnette Litter, PA-C  Palliative Medicine Team Team phone # [phone]  Thank you for allowing the Palliative Medicine Team to assist in the care of this patient. Please utilize secure chat with additional questions, if there  is no response within 30 minutes please call the above phone number.  Palliative Medicine Team providers are available by phone from 7am to 7pm daily and can be reached through the team cell phone.  Should this patient require assistance outside of these hours, please call the patient's attending physician.

## 2021-08-06 DIAGNOSIS — I214 Non-ST elevation (NSTEMI) myocardial infarction: Secondary | ICD-10-CM | POA: Diagnosis not present

## 2021-08-06 DIAGNOSIS — E2740 Unspecified adrenocortical insufficiency: Secondary | ICD-10-CM | POA: Diagnosis not present

## 2021-08-06 DIAGNOSIS — I272 Pulmonary hypertension, unspecified: Secondary | ICD-10-CM | POA: Diagnosis not present

## 2021-08-06 DIAGNOSIS — N186 End stage renal disease: Secondary | ICD-10-CM | POA: Diagnosis not present

## 2021-08-06 LAB — CBC
HCT: 33.1 % — ABNORMAL LOW (ref 39.0–52.0)
Hemoglobin: 10.7 g/dL — ABNORMAL LOW (ref 13.0–17.0)
MCH: 32.1 pg (ref 26.0–34.0)
MCHC: 32.3 g/dL (ref 30.0–36.0)
MCV: 99.4 fL (ref 80.0–100.0)
Platelets: 81 10*3/uL — ABNORMAL LOW (ref 150–400)
RBC: 3.33 MIL/uL — ABNORMAL LOW (ref 4.22–5.81)
RDW: 19.1 % — ABNORMAL HIGH (ref 11.5–15.5)
WBC: 5.8 10*3/uL (ref 4.0–10.5)
nRBC: 0 % (ref 0.0–0.2)

## 2021-08-06 LAB — GLUCOSE, CAPILLARY
Glucose-Capillary: 142 mg/dL — ABNORMAL HIGH (ref 70–99)
Glucose-Capillary: 128 mg/dL — ABNORMAL HIGH (ref 70–99)
Glucose-Capillary: 153 mg/dL — ABNORMAL HIGH (ref 70–99)

## 2021-08-06 LAB — HEPARIN LEVEL (UNFRACTIONATED): Heparin Unfractionated: 0.3 IU/mL (ref 0.30–0.70)

## 2021-08-06 LAB — URINE CULTURE: Culture: >=100000 — AB

## 2021-08-06 LAB — LIPOPROTEIN A (LPA): Lipoprotein (a): 70.8 nmol/L — ABNORMAL HIGH (ref <75.0)

## 2021-08-06 MED ORDER — FOSFOMYCIN TROMETHAMINE 3 G PO PACK
3.0000 g | PACK | Freq: Once | ORAL | Status: AC
  Administered 2021-08-06: 3 g via ORAL
  Filled 2021-08-06: qty 3

## 2021-08-06 MED ORDER — LORAZEPAM 2 MG/ML PO CONC
0.5000 mg | ORAL | Status: DC | PRN
  Administered 2021-08-06 – 2021-08-10 (×5): 1 mg via ORAL
  Filled 2021-08-06 (×6): qty 1

## 2021-08-06 MED ORDER — OXYCODONE HCL 5 MG PO TABS
5.0000 mg | ORAL_TABLET | Freq: Four times a day (QID) | ORAL | Status: DC | PRN
  Administered 2021-08-06 – 2021-08-10 (×5): 5 mg via ORAL
  Filled 2021-08-06 (×5): qty 1

## 2021-08-06 MED ORDER — LORAZEPAM 2 MG/ML PO CONC: 0.5000 mg | ORAL | Status: DC | PRN

## 2021-08-06 MED ORDER — CHLORHEXIDINE GLUCONATE CLOTH 2 % EX PADS
6.0000 | MEDICATED_PAD | Freq: Every day | CUTANEOUS | Status: DC
  Administered 2021-08-06 – 2021-08-09 (×4): 6 via TOPICAL

## 2021-08-06 NOTE — Progress Notes (Signed)
[facility] NEPHROLOGY PROGRESS NOTE  Assessment/ Plan: Pt is a 71 y.o. yo male  with PMH significant for hypertension, DM, OSA, A-fib, NASH liver cirrhosis, thrombocytopenia, GI bleed, severe pulmonary hypertension with RV failure, chronic hypoxic respiratory failure on home oxygen, colon cancer status post resection, CAD, ESRD on HD TTS at [facility] who was transferred from [facility] for NSTEMI and possible cardiac cath, seen as a consultation for the management of dialysis.  Dialysis Orders from previous admission:  [facility], TTS 4hr EDW 108kg 400/500 2/2.5 T35 Heparin 0 Mircera 216mg q2weeks    # #NSTEMI/chest pain: Seen by cardiologist.  Noted patient is now DNR and plan for SNF hospice care.  No procedures planned by cardiology.   # [facility]  Status post HD yesterday with around 3 L ultrafiltration.  Right IJ for the access.  Next HD tomorrow.     #Dialysis Access: Using right IJ TDC.  He had right upper extremity AV graft placed by Dr. EDonnetta Hutchingon 8/1.  The wound healing good.   #Volume/blood pressure: Blood pressure acceptable.  On midodrine for hypotension.   # Anemia of ESRD: Hemoglobin at goal.  No need for ESA.   # Metabolic Bone Disease: Monitor calcium and phosphorus level.  Subjective: Seen and examined.  Noted he is DNR and leaning towards palliative care.  Denies nausea, vomiting, chest pain, shortness of breath.  Objective Vital signs in last 24 hours: Vitals:   08/05/21 2307 08/05/21 2309 08/05/21 2320 08/06/21 0441  BP:   97/65 (!) 120/36  Pulse:  97 98 100  Resp:  14 20 14   Temp: 97.7 F (36.5 C)  97.9 F (36.6 C) 97.9 F (36.6 C)  TempSrc: Oral  Oral Oral  SpO2:  97% 97% 100%  Weight:      Height:       Weight change: -3.385 kg  Intake/Output Summary (Last 24 hours) at 08/06/2021 1055 Last data filed at 08/06/2021 0600 Gross per 24 hour  Intake 775.7 ml  Output 3 ml  Net 772.7 ml       Labs: RENAL  PANEL Recent Labs    11/30/20 0320 11/30/20 1510 12/01/20 0330 12/01/20 0335 12/02/20 0458 12/02/20 1338 12/03/20 0504 12/03/20 1000 12/03/20 1712 12/04/20 0418 12/04/20 1745 12/05/20 0410 12/06/20 0411 12/07/20 0649 12/08/20 1055 12/09/20 0953 03/02/21 0831 04/24/21 0425 04/26/21 0343 05/04/21 1228 05/05/21 0750 05/06/21 0410 05/07/21 0436 05/07/21 1417 05/08/21 0429 05/09/21 0520 05/10/21 0430 05/13/21 0439 05/14/21 1150 05/15/21 1450 05/27/21 1609 08/03/21 0707 08/04/21 1455 08/04/21 2208 08/05/21 0829 08/05/21 1941  NA 135   < >  --    < > 132*   < > 132* 132*   < > 134*   < > 131* 132* 128* 130* 132*   < > 136 136   < > 136 136 133* 133* 132* 134* 133* 131* 131* 132* 136 137 132*  --  132* 134*  K 4.5   < >  --    < > 5.0   < > 4.4 4.4   < > 4.2   < > 4.5 4.3 4.6 4.4 5.0   < > 3.8 3.7   < > 4.8 5.3* 4.0 4.3 4.1 3.4* 4.5 3.9 3.8 4.2 4.6 4.1 4.1  --  5.0 3.7  CL 102   < >  --    < > 102   < > 100 102   < > 102   < >  100 97* 95* 96* 98   < > 103 103   < > 103 105 101 100 99 99 100 97* 97* 97* 101 100 98  --  97* 95*  CO2 27   < >  --    < > 24   < > 27 24   < > 27   < > 25 27 24 25 24    < > 25 25   < > 19* 18* 21* 23 23 26  21* 24 22 25 26   --  22  --  21* 28  GLUCOSE 117*   < >  --    < > 158*   < > 144* 180*   < > 115*   < > 157* 141* 160* 100* 120*   < > 78 92   < > 113* 141* 130* 130* 135* 128* 192* 213* 195* 161* 88 101* 85  --  74 112*  BUN 20   < >  --    < > 16   < > 22 22   < > 26*   < > 49* 38* 74* 59* 77*   < > 37* 31*   < > 47* 56* 32* 35* 38* 20 28* 38* 35* 51* 68* 56* 40*  --  48* 22  CREATININE 1.81*   < >  --    < > 1.36*   < > 1.95* 1.82*   < > 1.81*   < > 2.86* 2.44* 3.80* 3.87* 5.01*   < > 5.46* 5.82*   < > 7.19* 7.94* 5.37* 6.02* 6.42* 4.60* 5.89* 6.35* 5.36* 6.85* 8.66* 7.10* 5.29*  --  5.93* 3.73*  CALCIUM 8.1*   < >  --    < > 8.4*   < > 8.6* 8.2*   < > 8.7*   < > 8.6* 8.6* 8.5* 8.6* 8.7*   < > 8.2* 7.9*   < > 8.4* 8.3* 7.8* 7.9* 8.0* 8.2* 8.5*  8.5* 8.2* 8.3* 8.5*  --  9.0  --  8.8* 8.6*  MG 2.4  --  2.3  --  2.4  --  2.4 2.4  --  2.6*  --  2.6* 2.2  --   --   --   --   --   --   --  2.3  --   --   --   --   --   --   --   --   --   --   --   --  2.0  --   --   PHOS 3.2   < >  --    < > 3.2   < > 2.1*  --    < > 1.9*   < > 4.7* 4.5 5.3* 4.3 5.9*  --  5.1* 4.8*  --   --   --   --  5.3*  --   --   --  5.4*  --  6.6*  --   --   --   --   --  3.1  ALBUMIN 2.7*   < >  --    < > 2.9*   < > 3.0*  --    < > 3.3*   < > 3.2* 3.3* 3.3* 3.2* 3.3*   < > 2.4* 2.2*   < > 3.2* 2.9* 2.7* 2.5*  --   --  2.8* 3.2* 3.4* 3.3*  --   --  2.9*  --   --  2.7*   < > = values in this interval not displayed.     Liver Function Tests: Recent Labs  Lab 08/04/21 1455 08/05/21 1941  AST 27  --   ALT 15  --   ALKPHOS 69  --   BILITOT 1.1  --   PROT 6.2*  --   ALBUMIN 2.9* 2.7*   Recent Labs  Lab 08/04/21 1455  LIPASE 18   No results for input(s): "AMMONIA" in the last 168 hours. CBC: Recent Labs    10/14/20 1439 10/15/20 0538 11/12/20 1537 11/20/20 1128 11/26/20 1410 11/27/20 0500 04/22/21 1500 04/22/21 2138 05/27/21 1609 08/03/21 0707 08/04/21 1455 08/05/21 0316 08/06/21 0434  HGB  --    < > 9.0*   < >  --    < >  --    < > 11.3* 14.6 11.3* 11.2* 10.7*  MCV  --    < > 90.8   < >  --    < >  --    < > 104.9*  --  99.4 99.7 99.4  VITAMINB12 691  --   --   --  509  --  852  --   --   --   --   --   --   FOLATE 8.6  --   --   --  8.1  --  11.1  --   --   --   --   --   --   FERRITIN 25  --  45  --  31  --  135  --   --   --   --   --   --   TIBC 476*  --  355  --  389  --  314  --   --   --   --   --   --   IRON 15*  --  22*  --  17*  --  79  --   --   --   --   --   --   RETICCTPCT 2.4  --   --   --  2.0  --  5.1*  --   --   --   --   --   --    < > = values in this interval not displayed.    Cardiac Enzymes: No results for input(s): "CKTOTAL", "CKMB", "CKMBINDEX", "TROPONINI" in the last 168 hours. CBG: Recent Labs  Lab 08/05/21 0914  08/05/21 1157 08/05/21 2311 08/06/21 0426 08/06/21 0758  GLUCAP 75 67* 119* 142* 128*    Iron Studies: No results for input(s): "IRON", "TIBC", "TRANSFERRIN", "FERRITIN" in the last 72 hours. Studies/Results: DG Chest Port 1 View  Result Date: 08/04/2021 CLINICAL DATA:  Abdominal pain, dialysis due tomorrow EXAM: PORTABLE CHEST 1 VIEW COMPARISON:  Radiographs 05/27/2021 FINDINGS: Cardiomegaly. Patchy bilateral airspace and interstitial opacities suggestive of edema. Left basilar atelectasis/consolidation. Possible small bilateral pleural effusions. Aortic calcification. Right IJ CVC tip in the right atrium. Partially visualized gaseous distention of the stomach. IMPRESSION: Slight interval worsening in bilateral airspace disease compatible with edema. Probable small bilateral pleural effusions. Electronically Signed   By: Placido Sou M.D.   On: 08/04/2021 18:28    Medications: Infusions:  heparin 1,650 Units/hr (08/06/21 0552)    Scheduled Medications:  allopurinol  200 mg Oral Daily   aspirin  81 mg Oral Daily   atorvastatin  80 mg Oral Daily   Chlorhexidine Gluconate Cloth  6 each Topical Q0600  cholecalciferol  5,000 Units Oral Daily   ferrous sulfate  325 mg Oral Daily   fosfomycin  3 g Oral Once   hydrocortisone  10 mg Oral BID   lactulose  30 g Oral Daily   levothyroxine  100 mcg Oral Q0600   midodrine  10 mg Oral TID WC   pantoprazole  40 mg Oral BID   rifaximin  550 mg Oral Q12H    have reviewed scheduled and prn medications.  Physical Exam: General:NAD, comfortable Heart:RRR, s1s2 nl Lungs:clear b/l, no crackle Abdomen:soft, Non-tender, non-distended Extremities:No edema Dialysis Access: Right IJ TDC in place.  Tyree Vandruff Prasad Truman Aceituno 08/06/2021,10:55 AM  LOS: 2 days

## 2021-08-06 NOTE — Progress Notes (Signed)
PROGRESS NOTE        PATIENT DETAILS Name: William Flores Age: 71 y.o. Sex: male Date of Birth: [birth year] Admit Date: 08/04/2021 Admitting Physician Clance Boll, MD [id number], [name], MD  Brief Summary: Patient is a 71 y.o.  male with a history of ESRD on HD TTS, chronic hypoxic respiratory failure on home O2, pulmonary hypertension, Nash liver cirrhosis, DM-2 who presented with epigastric discomfort-found to have non-STEMI and subsequently admitted to the hospitalist service.  See below for further details.  Significant events: 8/2>> admit to [facility]-epigastric pain-non-STEMI. 8/4>> discussed with patient-DNR-agrees to pursue gentle medical treatment.  No LHC planned.  Significant studies: 8/2>> CXR:  Pulmonary edema.  Significant microbiology data: 8/2>> urine culture: ESBL E. Coli (colonization and not a real infection)  Procedures:   Consults: Cardiology, nephrology.Pall care  Subjective: Chest pain or shortness of breath.  Long discussion regarding goals of care-now DNR reestablished.  Objective: Vitals: Blood pressure (!) 120/36, pulse 100, temperature 97.8 F (36.6 C), temperature source Oral, resp. rate 20, height 5' 4"  (1.626 m), weight 105.2 kg, SpO2 100 %.   Exam: Gen Exam:Alert awake-not in any distress HEENT:atraumatic, normocephalic Chest: B/L clear to auscultation anteriorly CVS:S1S2 regular Abdomen:soft non tender, non distended Extremities:no edema Neurology: Non focal Skin: no rash   Pertinent Labs/Radiology:    Latest Ref Rng & Units 08/06/2021    4:34 AM 08/05/2021    3:16 AM 08/04/2021    2:55 PM  CBC  WBC 4.0 - 10.5 K/uL 5.8  5.5  5.6   Hemoglobin 13.0 - 17.0 g/dL 10.7  11.2  11.3   Hematocrit 39.0 - 52.0 % 33.1  34.8  34.7   Platelets 150 - 400 K/uL 81  74  83     Lab Results  Component Value Date   NA 134 (L) 08/05/2021   K 3.7 08/05/2021   CL 95 (L) 08/05/2021   CO2 28 08/05/2021       Assessment/Plan: Non-STEMI: No chest pain-extensive discussion with patient this morning-plans are to continue gentle medical treatment without pursuing LHC and other invasive procedures.  We will plan to stop IV heparin after 48 hours.  Continue aspirin/statin/beta-blocker.  Appreciate cardiology input.   ESRD on HD TTS: Nephrology following-we will defer further to nephrology.  Fluid overload due to ESRD/acute on chronic RV failure/acute on chronic HFpEF: Volume removal with HD.    Chronic atrial fibrillation: Rate controlled-Coumadin held-on IV heparin.  NASH cirrhosis: Relatively well compensated-follow closely.  History of hepatic encephalopathy: Currently alert/awake-continue rifaximin/lactulose  Chronic hypoxic respiratory failure on home O2-2-3 L/min: Continue O2 at home regimen.  ?  Adrenal insufficiency: Appears to be on Solu-Cortef since his last hospitalization at [facility]-this will be continued.  Attending MD at SNF to taper off steroids at his/her discretion.  Asymptomatic bacteriuria: No indication of UTI-stop Rocephin-1 dose of fosfomycin as patient is immunocompromised.  Hypoglycemia: Likely due to being off steroids/n.p.o. status-has resolved after initiation of usual steroid dosing-continue to follow.   Recent Labs    08/05/21 2311 08/06/21 0426 08/06/21 0758  GLUCAP 119* 142* 128*     Chronic hypotension: BP stable-continue midodrine.  Hypothyroidism: Continue Synthroid  Gout: No flare-continue allopurinol.  GERD: Continue PPI  Palliative care: Long discussion with patient this morning-he is aware of poor overall prognosis-that he is a poor candidate for aggressive care-made  DNR-plan is to reestablish with hospice at [facility].  Palliative care following-we will await further recommendations.  Morbid Obesity: Estimated body mass index is 39.81 kg/m as calculated from the following:   Height as of this encounter: 5' 4"  (1.626 m).   Weight as of this encounter:  105.2 kg.   Code status:   Code Status: DNR   DVT Prophylaxis: IV heparin.  Family Communication: Daughter-[name]-updated over the phone on 8/4   Disposition Plan: Status is: Inpatient Remains inpatient appropriate because: Non-STEMI-see above.   Planned Discharge Destination:Skilled nursing facility   Diet: Diet Order             Diet renal with fluid restriction Fluid restriction: 1200 mL Fluid; Room service appropriate? Yes; Fluid consistency: Thin  Diet effective now                     Antimicrobial agents: Anti-infectives (From admission, onward)    Start     Dose/Rate Route Frequency Ordered Stop   08/06/21 1015  fosfomycin (MONUROL) packet 3 g        3 g Oral  Once 08/06/21 0922     08/05/21 1800  cefTRIAXone (ROCEPHIN) 1 g in sodium chloride 0.9 % 100 mL IVPB  Status:  Discontinued        1 g 200 mL/hr over 30 Minutes Intravenous Every 24 hours 08/04/21 2314 08/06/21 0922   08/05/21 1159  rifaximin (XIFAXAN) tablet 550 mg        550 mg Oral Every 12 hours 08/05/21 1017     08/04/21 1815  cefTRIAXone (ROCEPHIN) 1 g in sodium chloride 0.9 % 100 mL IVPB        1 g 200 mL/hr over 30 Minutes Intravenous  Once 08/04/21 1808 08/04/21 1931        MEDICATIONS: Scheduled Meds:  allopurinol  200 mg Oral Daily   aspirin  81 mg Oral Daily   atorvastatin  80 mg Oral Daily   Chlorhexidine Gluconate Cloth  6 each Topical Q0600   cholecalciferol  5,000 Units Oral Daily   ferrous sulfate  325 mg Oral Daily   fosfomycin  3 g Oral Once   hydrocortisone  10 mg Oral BID   lactulose  30 g Oral Daily   levothyroxine  100 mcg Oral Q0600   midodrine  10 mg Oral TID WC   pantoprazole  40 mg Oral BID   rifaximin  550 mg Oral Q12H   Continuous Infusions:  heparin 1,650 Units/hr (08/06/21 0552)   PRN Meds:.acetaminophen, dextrose, nitroGLYCERIN, ondansetron (ZOFRAN) IV, oxyCODONE   I have personally reviewed following labs and imaging  studies  LABORATORY DATA: CBC: Recent Labs  Lab 08/03/21 0707 08/04/21 1455 08/05/21 0316 08/06/21 0434  WBC  --  5.6 5.5 5.8  NEUTROABS  --  3.8  --   --   HGB 14.6 11.3* 11.2* 10.7*  HCT 43.0 34.7* 34.8* 33.1*  MCV  --  99.4 99.7 99.4  PLT  --  83* 74* 81*     Basic Metabolic Panel: Recent Labs  Lab 08/03/21 0707 08/04/21 1455 08/04/21 2208 08/05/21 0829 08/05/21 1941  NA 137 132*  --  132* 134*  K 4.1 4.1  --  5.0 3.7  CL 100 98  --  97* 95*  CO2  --  22  --  21* 28  GLUCOSE 101* 85  --  74 112*  BUN 56* 40*  --  48* 22  CREATININE  7.10* 5.29*  --  5.93* 3.73*  CALCIUM  --  9.0  --  8.8* 8.6*  MG  --   --  2.0  --   --   PHOS  --   --   --   --  3.1     GFR: Estimated Creatinine Clearance: 20.5 mL/min (A) (by C-G formula based on SCr of 3.73 mg/dL (H)).  Liver Function Tests: Recent Labs  Lab 08/04/21 1455 08/05/21 1941  AST 27  --   ALT 15  --   ALKPHOS 69  --   BILITOT 1.1  --   PROT 6.2*  --   ALBUMIN 2.9* 2.7*    Recent Labs  Lab 08/04/21 1455  LIPASE 18    No results for input(s): "AMMONIA" in the last 168 hours.  Coagulation Profile: Recent Labs  Lab 08/05/21 0316  INR 1.5*     Cardiac Enzymes: No results for input(s): "CKTOTAL", "CKMB", "CKMBINDEX", "TROPONINI" in the last 168 hours.  BNP (last 3 results) No results for input(s): "PROBNP" in the last 8760 hours.  Lipid Profile: Recent Labs    08/05/21 0316  CHOL 97  HDL 27*  LDLCALC 57  TRIG 63  CHOLHDL 3.6     Thyroid Function Tests: Recent Labs    08/05/21 0316  TSH 3.124     Anemia Panel: No results for input(s): "VITAMINB12", "FOLATE", "FERRITIN", "TIBC", "IRON", "RETICCTPCT" in the last 72 hours.  Urine analysis:    Component Value Date/Time   COLORURINE BROWN (A) 08/04/2021 1550   APPEARANCEUR TURBID (A) 08/04/2021 1550   LABSPEC 1.025 08/04/2021 1550   PHURINE 6.5 08/04/2021 1550   GLUCOSEU 100 (A) 08/04/2021 1550   HGBUR LARGE (A) 08/04/2021  1550   BILIRUBINUR MODERATE (A) 08/04/2021 1550   KETONESUR 15 (A) 08/04/2021 1550   PROTEINUR >300 (A) 08/04/2021 1550   UROBILINOGEN 0.2 05/16/2014 0822   NITRITE POSITIVE (A) 08/04/2021 1550   LEUKOCYTESUR LARGE (A) 08/04/2021 1550    Sepsis Labs: Lactic Acid, Venous    Component Value Date/Time   LATICACIDVEN 1.7 05/04/2021 1228    MICROBIOLOGY: Recent Results (from the past 240 hour(s))  Urine Culture     Status: Abnormal   Collection Time: 08/04/21  3:50 PM   Specimen: Urine, Catheterized  Result Value Ref Range Status   Specimen Description   Final    URINE, CATHETERIZED Performed at [facility], [address] [place], [place] [address]    Special Requests   Final    NONE Performed at [facility], [address] [place], [place] [address]    Culture (A)  Final    >=100,000 COLONIES/mL ESCHERICHIA COLI Confirmed Extended Spectrum Beta-Lactamase Producer (ESBL).  In bloodstream infections from ESBL organisms, carbapenems are preferred over piperacillin/tazobactam. They are shown to have a lower risk of mortality.    Report Status 08/06/2021 FINAL  Final   Organism ID, Bacteria ESCHERICHIA COLI (A)  Final      Susceptibility   Escherichia coli - MIC*    AMPICILLIN >=32 RESISTANT Resistant     CEFAZOLIN >=64 RESISTANT Resistant     CEFEPIME 16 RESISTANT Resistant     CEFTRIAXONE >=64 RESISTANT Resistant     CIPROFLOXACIN >=4 RESISTANT Resistant     GENTAMICIN >=16 RESISTANT Resistant     IMIPENEM <=0.25 SENSITIVE Sensitive     NITROFURANTOIN 128 RESISTANT Resistant     TRIMETH/SULFA >=320 RESISTANT Resistant     AMPICILLIN/SULBACTAM 16 INTERMEDIATE Intermediate  PIP/TAZO 16 SENSITIVE Sensitive     * >=100,000 COLONIES/mL ESCHERICHIA COLI    RADIOLOGY STUDIES/RESULTS: DG Chest Port 1 View  Result Date: 08/04/2021 CLINICAL DATA:  Abdominal pain, dialysis due tomorrow EXAM: PORTABLE CHEST 1 VIEW COMPARISON:  Radiographs 05/27/2021 FINDINGS: Cardiomegaly.  Patchy bilateral airspace and interstitial opacities suggestive of edema. Left basilar atelectasis/consolidation. Possible small bilateral pleural effusions. Aortic calcification. Right IJ CVC tip in the right atrium. Partially visualized gaseous distention of the stomach. IMPRESSION: Slight interval worsening in bilateral airspace disease compatible with edema. Probable small bilateral pleural effusions. Electronically Signed   By: Placido Sou M.D.   On: 08/04/2021 18:28     LOS: 2 days   Oren Binet, MD  [facility]    To contact the attending provider between 7A-7P or the covering provider during after hours 7P-7A, please log into the web site [email or web address] and access using universal [place] password for that web site. If you do not have the password, please call the hospital operator.  08/06/2021, 11:32 AM

## 2021-08-06 NOTE — Progress Notes (Signed)
OT Cancellation Note  Patient Details Name: William Flores MRN: [id number] DOB: [birth year]   Cancelled Treatment:     Pt. Refused and stated he may be going hospice. No consult has been done yet. Followup next week.   Emannuel Vise 08/06/2021, 11:31 AM

## 2021-08-06 NOTE — Consult Note (Signed)
   [facility] Inpatient Consult   08/06/2021  William Flores June 22, 1950 [id number]   [place] Organization [[facility]] Patient:  Medicare ACO REACH  Patient is LTC at [facility] prior to admission  Primary Care Provider:  [name], [name], MD, [place], is an embedded provider with a Chronic Care Management team and program, and is listed for the transition of care follow up and appointments.  Reviewed MD progress notes this morning regarding patient's DNR/Hospice status, as well.  Plan: Currently, patient needs for post hospital are to be met at a  LTC skilled nursing facility level of care for post hospital.    Please contact for further questions,  Natividad Brood, RN BSN [facility] Liaison  [phone] business mobile phone Toll free office [phone]  Fax number: [phone] [email or web address]

## 2021-08-06 NOTE — Progress Notes (Signed)
Brief note: Long discussion with patient at bedside-given his multiple comorbid issues-cirrhosis/ESRD/severe RV failure-recommendations are to proceed with gentle medical treatment and not invasive care.  He was following with hospice at SNF-but had revoked his DNR.  After extensive discussion today-he is agreeable to follow-up with hospice again-and has agreed to a DNR order.  I will touch base with the palliative care team.

## 2021-08-06 NOTE — Progress Notes (Addendum)
Progress Note  Patient Name: William Flores Date of Encounter: 08/06/2021  [facility] Cardiologist: Minus Breeding, MD   Subjective   Pt has no complaints other than dry mouth.  Inpatient Medications    Scheduled Meds:  allopurinol  200 mg Oral Daily   aspirin  81 mg Oral Daily   atorvastatin  80 mg Oral Daily   Chlorhexidine Gluconate Cloth  6 each Topical Q0600   cholecalciferol  5,000 Units Oral Daily   ferrous sulfate  325 mg Oral Daily   fosfomycin  3 g Oral Once   hydrocortisone  10 mg Oral BID   lactulose  30 g Oral Daily   levothyroxine  100 mcg Oral Q0600   midodrine  10 mg Oral TID WC   pantoprazole  40 mg Oral BID   rifaximin  550 mg Oral Q12H   Continuous Infusions:  heparin 1,650 Units/hr (08/06/21 0552)   PRN Meds: acetaminophen, dextrose, nitroGLYCERIN, ondansetron (ZOFRAN) IV   Vital Signs    Vitals:   08/05/21 2307 08/05/21 2309 08/05/21 2320 08/06/21 0441  BP:   97/65 (!) 120/36  Pulse:  97 98 100  Resp:  14 20 14   Temp: 97.7 F (36.5 C)  97.9 F (36.6 C) 97.9 F (36.6 C)  TempSrc: Oral  Oral Oral  SpO2:  97% 97% 100%  Weight:      Height:        Intake/Output Summary (Last 24 hours) at 08/06/2021 1031 Last data filed at 08/06/2021 0600 Gross per 24 hour  Intake 775.7 ml  Output 3 ml  Net 772.7 ml      08/05/2021    6:16 PM 08/05/2021    1:33 PM 08/04/2021    2:45 PM  Last 3 Weights  Weight (lbs) 231 lb 14.8 oz 238 lb 8.6 oz 246 lb  Weight (kg) 105.2 kg 108.2 kg 111.585 kg      Telemetry    Atrial fibrillation with controlled VR - Personally Reviewed  ECG    No new tracings - Personally Reviewed  Physical Exam   GEN: No acute distress.   Neck: No JVD Cardiac: irregular rhythm, regular rate   Respiratory: respirations unlabored GI: Soft, nontender, non-distended  MS: chronic skin changes from edema Neuro:  Nonfocal  Psych: Normal affect   Labs    High Sensitivity Troponin:   Recent Labs  Lab 08/04/21 1455  08/04/21 1755  TROPONINIHS 1,082* 1,057*     Chemistry Recent Labs  Lab 08/04/21 1455 08/04/21 2208 08/05/21 0829 08/05/21 1941  NA 132*  --  132* 134*  K 4.1  --  5.0 3.7  CL 98  --  97* 95*  CO2 22  --  21* 28  GLUCOSE 85  --  74 112*  BUN 40*  --  48* 22  CREATININE 5.29*  --  5.93* 3.73*  CALCIUM 9.0  --  8.8* 8.6*  MG  --  2.0  --   --   PROT 6.2*  --   --   --   ALBUMIN 2.9*  --   --  2.7*  AST 27  --   --   --   ALT 15  --   --   --   ALKPHOS 69  --   --   --   BILITOT 1.1  --   --   --   GFRNONAA 11*  --  10* 17*  ANIONGAP 12  --  14 11  Lipids  Recent Labs  Lab 08/05/21 0316  CHOL 97  TRIG 63  HDL 27*  LDLCALC 57  CHOLHDL 3.6    Hematology Recent Labs  Lab 08/04/21 1455 08/05/21 0316 08/06/21 0434  WBC 5.6 5.5 5.8  RBC 3.49* 3.49* 3.33*  HGB 11.3* 11.2* 10.7*  HCT 34.7* 34.8* 33.1*  MCV 99.4 99.7 99.4  MCH 32.4 32.1 32.1  MCHC 32.6 32.2 32.3  RDW 19.2* 19.0* 19.1*  PLT 83* 74* 81*   Thyroid  Recent Labs  Lab 08/05/21 0316  TSH 3.124    BNP Recent Labs  Lab 08/04/21 2208  BNP 1,412.6*    DDimer No results for input(s): "DDIMER" in the last 168 hours.   Radiology    DG Chest Port 1 View  Result Date: 08/04/2021 CLINICAL DATA:  Abdominal pain, dialysis due tomorrow EXAM: PORTABLE CHEST 1 VIEW COMPARISON:  Radiographs 05/27/2021 FINDINGS: Cardiomegaly. Patchy bilateral airspace and interstitial opacities suggestive of edema. Left basilar atelectasis/consolidation. Possible small bilateral pleural effusions. Aortic calcification. Right IJ CVC tip in the right atrium. Partially visualized gaseous distention of the stomach. IMPRESSION: Slight interval worsening in bilateral airspace disease compatible with edema. Probable small bilateral pleural effusions. Electronically Signed   By: Placido Sou M.D.   On: 08/04/2021 18:28    Cardiac Studies   Echo 05/04/21:  1. Left ventricular ejection fraction, by estimation, is 60 to 65%. The   left ventricle has normal function. The left ventricle has no regional  wall motion abnormalities. Left ventricular diastolic parameters are  indeterminate.   2. Ventricular septum is flattened in systole consistent with RV pressure  overload. . Right ventricular systolic function is severely reduced. The  right ventricular size is severely enlarged. Tricuspid regurgitation  signal is inadequate for assessing PA   pressure.   3. Left atrial size was severely dilated.   4. Right atrial size was severely dilated.   5. The mitral valve is abnormal. Trivial mitral valve regurgitation. No  evidence of mitral stenosis. Moderate mitral annular calcification.   6. Tricuspid valve regurgitation not well visualized. not well visualized  tricuspid stenosis.   7. The aortic valve is tricuspid. There is moderate calcification of the  aortic valve. There is moderate thickening of the aortic valve. Aortic  valve regurgitation is not visualized. No aortic stenosis is present.   8. The inferior vena cava is dilated in size with <50% respiratory  variability, suggesting right atrial pressure of 15 mmHg.   Patient Profile     71 y.o. male with a hx of CAD, no PCI, paroxysmal afib, liver cirrhosis, chronic thrombocytopenia, severe pulm HTN, ESRD on dialysis, colon CA s/p resection, sleep apnea, prior GI bleed 04/2021 lives at a nursing facility  who is being seen 08/04/2021 for the evaluation of NSTEMI at the request of Dr. Melina Copa.  Assessment & Plan    NSTEMI RV failure Ischemic cardiomyopathy ESRD Atrial fibrillation Hx of GI bleed - pt has been changed to DNR and planning to return to SNF-hospice - will hold off on further invasive strategies at this time   [place] will sign off.   Medication Recommendations:  as in [facility] Other recommendations (labs, testing, etc):  none Follow up as an outpatient:  as needed  For questions or updates, please contact [place] Please consult  [email or web address] for contact info under      Signed, Ledora Bottcher, PA  08/06/2021, 10:31 AM    History and all data above reviewed.  Patient examined.  I agree with the findings as above.   The patient has no acute SOB or chest pain.  He is complaining of back pain.  The patient exam reveals [id number]  ,  Lungs: Decreased breath sounds  ,  Abd: Positive bowel sounds, no rebound no guarding, Ext Diffuse edema  .  All available labs, radiology testing, previous records reviewed. Agree with documented assessment and plan.   Elevated cardiac enzymes.  Admitted with, among other things, elevated troponin.  However, he was on hospice care previously and will be moving to hospice apparently.  Conservative and comfort measures are planned. I am not sure of the plan for dialysis.   Can discontinue heparin.  He is on ASA now and that does not look like it was on his previous list.  I would continue this if no active GI bleeding.  I think it is reasonable to have him on the higher dose of Lipitor we started.  I don't think the is a need for invasive evaluation.  His biggest issues is RV failure but his  BP is tolerating this although labile.  Continue midodrine.   Jeneen Rinks Vander Kueker  11:08 AM  08/06/2021

## 2021-08-06 NOTE — Care Management Important Message (Signed)
Important Message  Patient Details  Name: William Flores MRN: [id number] Date of Birth: 09-30-1950   Medicare Important Message Given:  Yes     Hannah Beat 08/06/2021, 1:14 PM

## 2021-08-06 NOTE — Progress Notes (Signed)
PT Cancellation Note  Patient Details Name: William Flores MRN: [id number] DOB: 18-Mar-1950   Cancelled Treatment:    Reason Eval/Treat Not Completed: Other (comment).  Declined PT and talked about hospice recommendation.  Follow up with MD to see if PT should persist with pt.   Ramond Dial 08/06/2021, 12:58 PM  Mee Hives, PT PhD Acute Rehab Dept. Number: [place]

## 2021-08-06 NOTE — Plan of Care (Signed)
  Problem: Education: Goal: Understanding of CV disease, CV risk reduction, and recovery process will improve Outcome: Progressing Goal: Individualized Educational Video(s) Outcome: Progressing   Problem: Cardiovascular: Goal: Ability to achieve and maintain adequate cardiovascular perfusion will improve Outcome: Progressing Goal: Vascular access site(s) Level 0-1 will be maintained Outcome: Progressing   Problem: Health Behavior/Discharge Planning: Goal: Ability to safely manage health-related needs after discharge will improve Outcome: Progressing

## 2021-08-06 NOTE — Progress Notes (Signed)
Daily Progress Note   Patient Name: William Flores       Date: 08/06/2021 DOB: 15-Aug-1950  Age: 71 y.o. MRN#: [id number] Attending Physician: Jonetta Osgood, MD Primary Care Physician: [name], [name], MD Admit Date: 08/04/2021  Reason for Consultation/Follow-up: Establishing goals of care  Subjective: Medical records reviewed including progress notes, labs. Patient assessed at the bedside. Discussed with RN. He reports having difficulty sleeping since his conversation about hospice this morning. No other concerns.  Created space and opportunity for patient's thoughts and feelings on his current illness. He states that he understands aggressive care will likely only make things worse, but this is still hard for him to come to terms with. He speaks of wanting to see his children more often and of his step-daughter, who he has not seen in many years. Discussed his feelings that "there is nothing else to do but sit here and wait," providing reassurance that he will be supported through his transition and offering chaplain support. Patient states that he only gets more distressed when more people come to speak with him about it. We discussed the resumption of hospice support that he was receiving at his facility and he is agreeable, appreciating this will be consistent. Counseled on the availability of symptom management for comfort, describing risks and benefits of medications such as opioids and benzodiazepines. He is agreeable to a trial of PRN ativan, preferring the liquid solution.  I then called patient's daughter William Flores to provide updates on the above conversation and plan for symptom management to control his anxiety and existential suffering. She tells me she is on the way to the hospital to  provide patient with support and reassurance. She has also updated hospice representative to let her know that he is interested in resuming hospice once discharged back to SNF.  Questions and concerns addressed. PMT will continue to support holistically.   Length of Stay: 2   Physical Exam Vitals and nursing note reviewed.  Cardiovascular:     Rate and Rhythm: Normal rate.  Pulmonary:     Effort: Pulmonary effort is normal.  Skin:    General: Skin is warm and dry.  Neurological:     Mental Status: He is alert. Mental status is at baseline.  Psychiatric:        Mood and  Affect: Mood is anxious.             Vital Signs: BP (!) 120/36 (BP Location: Left Wrist)   Pulse 100   Temp 97.8 F (36.6 C) (Oral)   Resp 20   Ht 5' 4"  (1.626 m)   Wt 105.2 kg   SpO2 100%   BMI 39.81 kg/m  SpO2: SpO2: 100 % O2 Device: O2 Device: Nasal Cannula O2 Flow Rate: O2 Flow Rate (L/min): 3 L/min      Palliative Assessment/Data:     Palliative Care Assessment & Plan   Patient Profile: 71 y.o. male  with past medical history of OSA, P afib on coumadin, NASH liver cirrhosis ,thrombocytopenia, with history of GI bleed, severe pulmonary HTN with hx of RV failure, chronic hypoxic respiratory failure on 2-3L home 02, DMII,ESRD TTS ,hypothyroidism,colon CA s/p resection, as well as  hx non obstructive CAD on cath 2008 admitted on 08/04/2021 with abdominal and epigastric  pain/ chest discomfort.    Patient was transferred from [facility] with NSTEMI, currently being treated for ACS and with possible left heart cath upcoming.  Patient reportedly on hospice at [facility].  PMT has been consulted to assist with goals of care conversation.  Assessment: Goals of care conversation NSTEMI ESRD on HD Liver cirrhosis  Chronic respiratory failure with hypoxia  Recommendations/Plan: DNR confirmed Continue conservative medical management of NSTEMI and other reasonably treatable conditions, HD tomorrow Patient is  agreeable to returning to SNF with hospice and daughter has updated [place] accordingly Ordered sublingual ativan for anxiety Q4H PRN Spiritual care consult declined at this time Psychosocial and emotional support provided PMT will continue to follow and support   Prognosis:  Weeks  Discharge Planning: [place] with Hospice  Care plan was discussed with patient, patient's daughter, RN  MDM Hoback, PA-C  Palliative Medicine Team Team phone # [phone]  Thank you for allowing the Palliative Medicine Team to assist in the care of this patient. Please utilize secure chat with additional questions, if there is no response within 30 minutes please call the above phone number.  Palliative Medicine Team providers are available by phone from 7am to 7pm daily and can be reached through the team cell phone.  Should this patient require assistance outside of these hours, please call the patient's attending physician.

## 2021-08-06 NOTE — Progress Notes (Signed)
Pt receives out-pt HD at [facility] on TTS. Pt has a 6:45 chair time. Pt resides at [facility] [place] per clinic staff. Will assist as needed.  Melven Sartorius Renal Navigator [phone]

## 2021-08-07 DIAGNOSIS — I272 Pulmonary hypertension, unspecified: Secondary | ICD-10-CM | POA: Diagnosis not present

## 2021-08-07 DIAGNOSIS — N186 End stage renal disease: Secondary | ICD-10-CM | POA: Diagnosis not present

## 2021-08-07 DIAGNOSIS — I214 Non-ST elevation (NSTEMI) myocardial infarction: Secondary | ICD-10-CM | POA: Diagnosis not present

## 2021-08-07 DIAGNOSIS — E2740 Unspecified adrenocortical insufficiency: Secondary | ICD-10-CM | POA: Diagnosis not present

## 2021-08-07 LAB — RENAL FUNCTION PANEL
Albumin: 2.7 g/dL — ABNORMAL LOW (ref 3.5–5.0)
Anion gap: 11 (ref 5–15)
BUN: 41 mg/dL — ABNORMAL HIGH (ref 8–23)
CO2: 26 mmol/L (ref 22–32)
Calcium: 8.7 mg/dL — ABNORMAL LOW (ref 8.9–10.3)
Chloride: 96 mmol/L — ABNORMAL LOW (ref 98–111)
Creatinine, Ser: 5.47 mg/dL — ABNORMAL HIGH (ref 0.61–1.24)
GFR, Estimated: 11 mL/min — ABNORMAL LOW (ref >60)
Glucose, Bld: 107 mg/dL — ABNORMAL HIGH (ref 70–99)
Phosphorus: 3.9 mg/dL (ref 2.5–4.6)
Potassium: 4.1 mmol/L (ref 3.5–5.1)
Sodium: 133 mmol/L — ABNORMAL LOW (ref 135–145)

## 2021-08-07 LAB — CBC
HCT: 34.8 % — ABNORMAL LOW (ref 39.0–52.0)
HCT: 34.4 % — ABNORMAL LOW (ref 39.0–52.0)
Hemoglobin: 11.5 g/dL — ABNORMAL LOW (ref 13.0–17.0)
Hemoglobin: 11.1 g/dL — ABNORMAL LOW (ref 13.0–17.0)
MCH: 32.6 pg (ref 26.0–34.0)
MCH: 32.2 pg (ref 26.0–34.0)
MCHC: 33 g/dL (ref 30.0–36.0)
MCHC: 32.3 g/dL (ref 30.0–36.0)
MCV: 98.6 fL (ref 80.0–100.0)
MCV: 99.7 fL (ref 80.0–100.0)
Platelets: 113 10*3/uL — ABNORMAL LOW (ref 150–400)
Platelets: 109 10*3/uL — ABNORMAL LOW (ref 150–400)
RBC: 3.53 MIL/uL — ABNORMAL LOW (ref 4.22–5.81)
RBC: 3.45 MIL/uL — ABNORMAL LOW (ref 4.22–5.81)
RDW: 19.5 % — ABNORMAL HIGH (ref 11.5–15.5)
RDW: 19.3 % — ABNORMAL HIGH (ref 11.5–15.5)
WBC: 8.7 10*3/uL (ref 4.0–10.5)
WBC: 8.5 10*3/uL (ref 4.0–10.5)
nRBC: 1.2 % — ABNORMAL HIGH (ref 0.0–0.2)
nRBC: 0.9 % — ABNORMAL HIGH (ref 0.0–0.2)

## 2021-08-07 LAB — GLUCOSE, CAPILLARY
Glucose-Capillary: 103 mg/dL — ABNORMAL HIGH (ref 70–99)
Glucose-Capillary: 236 mg/dL — ABNORMAL HIGH (ref 70–99)

## 2021-08-07 LAB — HEPATITIS B SURFACE ANTIBODY, QUANTITATIVE: Hep B S AB Quant (Post): <3.1 m[IU]/mL — ABNORMAL LOW (ref >9.9)

## 2021-08-07 LAB — HEPARIN LEVEL (UNFRACTIONATED): Heparin Unfractionated: 0.54 IU/mL (ref 0.30–0.70)

## 2021-08-07 MED ORDER — ALTEPLASE 2 MG IJ SOLR: 2.0000 mg | Freq: Once | INTRAMUSCULAR | Status: DC | PRN

## 2021-08-07 MED ORDER — MIDODRINE HCL 5 MG PO TABS: 10.0000 mg | ORAL_TABLET | Freq: Once | ORAL | Status: AC

## 2021-08-07 MED ORDER — HEPARIN SODIUM (PORCINE) 5000 UNIT/ML IJ SOLN
5000.0000 [IU] | Freq: Three times a day (TID) | INTRAMUSCULAR | Status: DC
  Administered 2021-08-07 – 2021-08-10 (×9): 5000 [IU] via SUBCUTANEOUS
  Filled 2021-08-07 (×9): qty 1

## 2021-08-07 MED ORDER — ALBUMIN HUMAN 25 % IV SOLN
INTRAVENOUS | Status: AC
  Filled 2021-08-07: qty 100

## 2021-08-07 MED ORDER — HEPARIN SODIUM (PORCINE) 1000 UNIT/ML IJ SOLN: 1000.0000 [IU] | INTRAMUSCULAR | Status: DC | PRN

## 2021-08-07 MED ORDER — HEPARIN SODIUM (PORCINE) 1000 UNIT/ML IJ SOLN
INTRAMUSCULAR | Status: AC
  Filled 2021-08-07: qty 4

## 2021-08-07 MED ORDER — MIDODRINE HCL 5 MG PO TABS
ORAL_TABLET | ORAL | Status: AC
  Administered 2021-08-07: 10 mg via ORAL
  Filled 2021-08-07: qty 2

## 2021-08-07 MED ORDER — ANTICOAGULANT SODIUM CITRATE 4% (200MG/5ML) IV SOLN: 5.0000 mL | Status: DC | PRN

## 2021-08-07 MED ORDER — ALBUMIN HUMAN 25 % IV SOLN
25.0000 g | Freq: Once | INTRAVENOUS | Status: AC
  Administered 2021-08-07: 25 g via INTRAVENOUS

## 2021-08-07 NOTE — Progress Notes (Signed)
PT Cancellation Note  Patient Details Name: William Flores MRN: [id number] DOB: 04-02-1950   Cancelled Treatment:    Reason Eval/Treat Not Completed: Fatigue/lethargy limiting ability to participate this afternoon after HD. Pt reports he will also have HD tomorrow and is asking PT to follow up Monday to attempt evaluation.   [place], PT, DPT   Acute Rehabilitation Department   Sandra Cockayne 08/07/2021, 4:38 PM

## 2021-08-07 NOTE — Progress Notes (Signed)
   08/07/21 1520  Assess: MEWS Score  Pulse Rate (!) 114  ECG Heart Rate (!) 114  Resp (!) 23  SpO2 95 %  Assess: MEWS Score  MEWS Temp 0  MEWS Systolic 0  MEWS Pulse 2  MEWS RR 1  MEWS LOC 0  MEWS Score 3  MEWS Score Color Yellow  Assess: if the MEWS score is Yellow or Red  Were vital signs taken at a resting state? Yes  Focused Assessment No change from prior assessment  Does the patient meet 2 or more of the SIRS criteria? No  MEWS guidelines implemented *See Row Information* No, other (Comment) (Ghim[place] MD aware - HR is high, BP is too low to support more meds. Patient will be d/c'd home with hospice)  Assess: SIRS CRITERIA  SIRS Temperature  0  SIRS Pulse 1  SIRS Respirations  1  SIRS WBC 1  SIRS Score Sum  3

## 2021-08-07 NOTE — Progress Notes (Signed)
PT Cancellation Note  Patient Details Name: William Flores MRN: [id number] DOB: 1950/08/31   Cancelled Treatment:    Reason Eval/Treat Not Completed: Patient at procedure or test/unavailable this morning (pt off unit for HD). Will continue to check in and evaluate if PT aligns with pt's goals of care.    Sandra Cockayne 08/07/2021, 10:13 AM

## 2021-08-07 NOTE — Progress Notes (Signed)
Daily Progress Note   Patient Name: William Flores       Date: 08/07/2021 DOB: 1950-10-16  Age: 71 y.o. MRN#: [id number] Attending Physician: Jonetta Osgood, MD Primary Care Physician: [name], [name], MD Admit Date: 08/04/2021  Reason for Consultation/Follow-up: Establishing goals of care  Subjective: Medical records reviewed including progress notes, labs. Patient assessed at the bedside. Discussed with RN.  He is sitting comfortably in bed eating his lunch, no acute distress or concerns.  No family present during my visit.  Met with patient for palliative support and to discuss his thoughts and feelings on his illness.  He confirms that he is feeling calm and at peace, as he has just received as needed medications for pain and anxiety.  We discussed how dialysis went today.  He does wish to continue this for now.  Provided emotional support and therapeutic listening as he describes a visit with his daughter yesterday and his spirituality.  Questions and concerns addressed. PMT will continue to support holistically.   Length of Stay: 3   Physical Exam Vitals and nursing note reviewed.  Cardiovascular:     Rate and Rhythm: Normal rate.  Pulmonary:     Effort: Pulmonary effort is normal.  Skin:    General: Skin is warm and dry.  Neurological:     Mental Status: He is alert. Mental status is at baseline.  Psychiatric:        Mood and Affect: Mood is anxious.             Vital Signs: BP (!) 115/29   Pulse (!) 114   Temp 98.7 F (37.1 C) (Oral)   Resp (!) 23   Ht _0  (1.626 m)   Wt 103 kg   SpO2 95%   BMI 38.98 kg/m  SpO2: SpO2: 95 % O2 Device: O2 Device: Nasal Cannula O2 Flow Rate: O2 Flow Rate (L/min): 2 L/min      Palliative Assessment/Data:  30%     Palliative Care Assessment & Plan   Patient Profile: 71 y.o. male  with past medical history of OSA, P afib on coumadin, NASH liver cirrhosis ,thrombocytopenia, with history of GI bleed, severe pulmonary HTN with hx of RV failure, chronic hypoxic respiratory failure on 2-3L home 02, DMII,ESRD TTS ,hypothyroidism,colon CA s/p resection, as well  as  hx non obstructive CAD on cath 2008 admitted on 08/04/2021 with abdominal and epigastric  pain/ chest discomfort.    Patient was transferred from [facility] with NSTEMI, currently being treated for ACS and with possible left heart cath upcoming.  Patient reportedly on hospice at [facility].  PMT has been consulted to assist with goals of care conversation.  Assessment: Goals of care conversation NSTEMI ESRD on HD Liver cirrhosis  Chronic respiratory failure with hypoxia  Recommendations/Plan: DNR confirmed Continue current care, patient understands his poor prognosis and remains agreeable to transition to hospice at SNF once discharged  Psychosocial emotional support provided PMT will continue to follow and support as needed  Prognosis:  Weeks  Discharge Planning: [place] with Hospice  Care plan was discussed with patient  Total time: I spent 43 minutes in the care of the patient today in the above activities and documenting the encounter.  Dorthy Cooler, PA-C Palliative Medicine Team Team phone # [phone]  Thank you for allowing the Palliative Medicine Team to assist in the care of this patient. Please utilize secure chat with additional questions, if there is no response within 30 minutes please call the above phone number.  Palliative Medicine Team providers are available by phone from 7am to 7pm daily and can be reached through the team cell phone.  Should this patient require assistance outside of these hours, please call the patient's attending physician.

## 2021-08-07 NOTE — Progress Notes (Addendum)
PROGRESS NOTE        PATIENT DETAILS Name: William Flores Age: 71 y.o. Sex: male Date of Birth: Oct 15, 1950 Admit Date: 08/04/2021 Admitting Physician Clance Boll, MD HFW:[id number], [name], MD  Brief Summary: Patient is a 71 y.o.  male with a history of ESRD on HD TTS, chronic hypoxic respiratory failure on home O2, pulmonary hypertension, Nash liver cirrhosis, DM-2 who presented with epigastric discomfort-found to have non-STEMI and subsequently admitted to the hospitalist service.  See below for further details.  Significant events: 8/2>> admit to [facility]-epigastric pain-non-STEMI. 8/4>> discussed with patient-DNR-agrees to pursue gentle medical treatment.  No LHC planned.  Significant studies: 8/2>> CXR:  Pulmonary edema.  Significant microbiology data: 8/2>> urine culture: ESBL E. Coli (colonization and not a real infection)  Procedures:   Consults: Cardiology, nephrology.Pall care  Subjective: No chest pain or shortness of breath.  Lying comfortably in bed.  Objective: Vitals: Blood pressure (!) 109/49, pulse (!) 112, temperature (!) 97.4 F (36.3 C), temperature source Oral, resp. rate (!) 24, height 5' 4"  (1.626 m), weight 103 kg, SpO2 97 %.   Exam: Gen Exam:Alert awake-not in any distress HEENT:atraumatic, normocephalic Chest: B/L clear to auscultation anteriorly CVS:S1S2 regular Abdomen:soft non tender, non distended Extremities:no edema Neurology: Non focal Skin: no rash   Pertinent Labs/Radiology:    Latest Ref Rng & Units 08/07/2021   11:14 AM 08/07/2021    2:19 AM 08/06/2021    4:34 AM  CBC  WBC 4.0 - 10.5 K/uL 8.5  8.7  5.8   Hemoglobin 13.0 - 17.0 g/dL 11.1  11.5  10.7   Hematocrit 39.0 - 52.0 % 34.4  34.8  33.1   Platelets 150 - 400 K/uL 109  113  81     Lab Results  Component Value Date   NA 133 (L) 08/07/2021   K 4.1 08/07/2021   CL 96 (L) 08/07/2021   CO2 26 08/07/2021      Assessment/Plan: Non-STEMI: No  chest pain-extensive discussion with patient this morning-plans are to continue gentle medical treatment without pursuing LHC and other invasive procedures.  Continue aspirin/statin-okay to stop heparin as he has completed 48 hours of treatment.  ESRD on HD TTS: Nephrology following-we will defer further to nephrology.  Fluid overload due to ESRD/acute on chronic RV failure/acute on chronic HFpEF: Volume removal with HD.    Chronic atrial fibrillation: Rate controlled-Per prior notes-anticoagulation was discontinued in the setting of GI bleeding.  Given overall poor prognosis-history of cirrhosis/thrombocytopenia-prior GI bleeding-reasonable to monitor off anticoagulation.  Discussed with patient earlier this morning and then with daughter over the phone-but understand difficult situation.  NASH cirrhosis: Relatively well compensated-follow closely.  History of hepatic encephalopathy: Currently alert/awake-continue rifaximin/lactulose  Chronic hypoxic respiratory failure on home O2-2-3 L/min: Continue O2 at home regimen.  ?  Adrenal insufficiency: Appears to be on Solu-Cortef since his last hospitalization at [facility]-this will be continued.  Attending MD at SNF to taper off steroids at his/her discretion.  Asymptomatic bacteriuria: No indication of UTI-stop Rocephin-1 dose of fosfomycin as patient is immunocompromised.  Hypoglycemia: Likely due to being off steroids/n.p.o. status-has resolved after initiation of usual steroid dosing-continue to follow.   Recent Labs    08/06/21 0426 08/06/21 0758 08/06/21 1126  GLUCAP 142* 128* 153*     Chronic hypotension: BP stable-continue midodrine.  Hypothyroidism: Continue Synthroid  Gout: No flare-continue allopurinol.  GERD: Continue PPI  Palliative care: Extensive discussion during this hospitalization-poor overall prognosis-not a candidate for aggressive care-previously followed by hospice at [facility] apparently was revoked several weeks  back.  After extensive discussion-DNR reinstated-patient aware he is not a candidate for aggressive care-plans are to reinitiate hospice follow-up at SNF.  Palliative care following.  Morbid Obesity: Estimated body mass index is 38.98 kg/m as calculated from the following:   Height as of this encounter: 5' 4"  (1.626 m).   Weight as of this encounter: 103 kg.   Code status:   Code Status: DNR   DVT Prophylaxis: IV heparin.  Family Communication: Daughter-[name]-[phone]-updated over the phone on 8/5   Disposition Plan: Status is: Inpatient Remains inpatient appropriate because: Non-STEMI-see above.   Planned Discharge Destination:Skilled nursing facility likely early next week on 8/7.   Diet: Diet Order             Diet renal with fluid restriction Fluid restriction: 1200 mL Fluid; Room service appropriate? Yes; Fluid consistency: Thin  Diet effective now                     Antimicrobial agents: Anti-infectives (From admission, onward)    Start     Dose/Rate Route Frequency Ordered Stop   08/06/21 1015  fosfomycin (MONUROL) packet 3 g        3 g Oral  Once 08/06/21 0922 08/06/21 1159   08/05/21 1800  cefTRIAXone (ROCEPHIN) 1 g in sodium chloride 0.9 % 100 mL IVPB  Status:  Discontinued        1 g 200 mL/hr over 30 Minutes Intravenous Every 24 hours 08/04/21 2314 08/06/21 0922   08/05/21 1159  rifaximin (XIFAXAN) tablet 550 mg        550 mg Oral Every 12 hours 08/05/21 1017     08/04/21 1815  cefTRIAXone (ROCEPHIN) 1 g in sodium chloride 0.9 % 100 mL IVPB        1 g 200 mL/hr over 30 Minutes Intravenous  Once 08/04/21 1808 08/04/21 1931        MEDICATIONS: Scheduled Meds:  allopurinol  200 mg Oral Daily   aspirin  81 mg Oral Daily   atorvastatin  80 mg Oral Daily   Chlorhexidine Gluconate Cloth  6 each Topical Q0600   cholecalciferol  5,000 Units Oral Daily   ferrous sulfate  325 mg Oral Daily   heparin sodium (porcine)       hydrocortisone   10 mg Oral BID   lactulose  30 g Oral Daily   levothyroxine  100 mcg Oral Q0600   midodrine  10 mg Oral TID WC   pantoprazole  40 mg Oral BID   rifaximin  550 mg Oral Q12H   Continuous Infusions:  albumin human     heparin 1,650 Units/hr (08/07/21 1015)   PRN Meds:.acetaminophen, albumin human, dextrose, heparin sodium (porcine), LORazepam, nitroGLYCERIN, ondansetron (ZOFRAN) IV, oxyCODONE   I have personally reviewed following labs and imaging studies  LABORATORY DATA: CBC: Recent Labs  Lab 08/04/21 1455 08/05/21 0316 08/06/21 0434 08/07/21 0219 08/07/21 1114  WBC 5.6 5.5 5.8 8.7 8.5  NEUTROABS 3.8  --   --   --   --   HGB 11.3* 11.2* 10.7* 11.5* 11.1*  HCT 34.7* 34.8* 33.1* 34.8* 34.4*  MCV 99.4 99.7 99.4 98.6 99.7  PLT 83* 74* 81* 113* 109*     Basic Metabolic Panel: Recent Labs  Lab 08/03/21 0707  08/04/21 1455 08/04/21 2208 08/05/21 0829 08/05/21 1941 08/07/21 1113  NA 137 132*  --  132* 134* 133*  K 4.1 4.1  --  5.0 3.7 4.1  CL 100 98  --  97* 95* 96*  CO2  --  22  --  21* 28 26  GLUCOSE 101* 85  --  74 112* 107*  BUN 56* 40*  --  48* 22 41*  CREATININE 7.10* 5.29*  --  5.93* 3.73* 5.47*  CALCIUM  --  9.0  --  8.8* 8.6* 8.7*  MG  --   --  2.0  --   --   --   PHOS  --   --   --   --  3.1 3.9     GFR: Estimated Creatinine Clearance: 13.8 mL/min (A) (by C-G formula based on SCr of 5.47 mg/dL (H)).  Liver Function Tests: Recent Labs  Lab 08/04/21 1455 08/05/21 1941 08/07/21 1113  AST 27  --   --   ALT 15  --   --   ALKPHOS 69  --   --   BILITOT 1.1  --   --   PROT 6.2*  --   --   ALBUMIN 2.9* 2.7* 2.7*    Recent Labs  Lab 08/04/21 1455  LIPASE 18    No results for input(s): "AMMONIA" in the last 168 hours.  Coagulation Profile: Recent Labs  Lab 08/05/21 0316  INR 1.5*     Cardiac Enzymes: No results for input(s): "CKTOTAL", "CKMB", "CKMBINDEX", "TROPONINI" in the last 168 hours.  BNP (last 3 results) No results for  input(s): "PROBNP" in the last 8760 hours.  Lipid Profile: Recent Labs    08/05/21 0316  CHOL 97  HDL 27*  LDLCALC 57  TRIG 63  CHOLHDL 3.6     Thyroid Function Tests: Recent Labs    08/05/21 0316  TSH 3.124     Anemia Panel: No results for input(s): "VITAMINB12", "FOLATE", "FERRITIN", "TIBC", "IRON", "RETICCTPCT" in the last 72 hours.  Urine analysis:    Component Value Date/Time   COLORURINE BROWN (A) 08/04/2021 1550   APPEARANCEUR TURBID (A) 08/04/2021 1550   LABSPEC 1.025 08/04/2021 1550   PHURINE 6.5 08/04/2021 1550   GLUCOSEU 100 (A) 08/04/2021 1550   HGBUR LARGE (A) 08/04/2021 1550   BILIRUBINUR MODERATE (A) 08/04/2021 1550   KETONESUR 15 (A) 08/04/2021 1550   PROTEINUR >300 (A) 08/04/2021 1550   UROBILINOGEN 0.2 05/16/2014 0822   NITRITE POSITIVE (A) 08/04/2021 1550   LEUKOCYTESUR LARGE (A) 08/04/2021 1550    Sepsis Labs: Lactic Acid, Venous    Component Value Date/Time   LATICACIDVEN 1.7 05/04/2021 1228    MICROBIOLOGY: Recent Results (from the past 240 hour(s))  Urine Culture     Status: Abnormal   Collection Time: 08/04/21  3:50 PM   Specimen: Urine, Catheterized  Result Value Ref Range Status   Specimen Description   Final    URINE, CATHETERIZED Performed at [facility], [address] [place], [place] [address]    Special Requests   Final    NONE Performed at [facility], [address], [place], [place] [address]    Culture (A)  Final    >=100,000 COLONIES/mL ESCHERICHIA COLI Confirmed Extended Spectrum Beta-Lactamase Producer (ESBL).  In bloodstream infections from ESBL organisms, carbapenems are preferred over piperacillin/tazobactam. They are shown to have a lower risk of mortality.    Report Status 08/06/2021 FINAL  Final   Organism ID, Bacteria ESCHERICHIA  COLI (A)  Final      Susceptibility   Escherichia coli - MIC*    AMPICILLIN >=32 RESISTANT Resistant     CEFAZOLIN >=64 RESISTANT Resistant     CEFEPIME 16 RESISTANT  Resistant     CEFTRIAXONE >=64 RESISTANT Resistant     CIPROFLOXACIN >=4 RESISTANT Resistant     GENTAMICIN >=16 RESISTANT Resistant     IMIPENEM <=0.25 SENSITIVE Sensitive     NITROFURANTOIN 128 RESISTANT Resistant     TRIMETH/SULFA >=320 RESISTANT Resistant     AMPICILLIN/SULBACTAM 16 INTERMEDIATE Intermediate     PIP/TAZO 16 SENSITIVE Sensitive     * >=100,000 COLONIES/mL ESCHERICHIA COLI    RADIOLOGY STUDIES/RESULTS: No results found.   LOS: 3 days   Oren Binet, MD  [facility]    To contact the attending provider between 7A-7P or the covering provider during after hours 7P-7A, please log into the web site [email or web address] and access using universal [place] password for that web site. If you do not have the password, please call the hospital operator.  08/07/2021, 1:22 PM

## 2021-08-07 NOTE — Procedures (Addendum)
HD Note Patient BP dropping into the SBP 70.  New orders received and followed.  See MAR.  Decreased Dialysate temp to 36. Patient was asymptomatic during BP drops.  He tolerated treatment without complaints.  He did ask for tylenol for his right arm aching. He was anxious and did have to be assured that the request made was in the process of being met. Under goal for UF related to BP issues.  Total UF 1.3L

## 2021-08-07 NOTE — Procedures (Addendum)
Patient was seen on dialysis and the procedure was supervised.  BFR 400  Via TDC BP is  113/70.   Patient appears to be tolerating treatment well. Ok to [facility] with OP HD unit at [facility].  Jerrica Thorman Tanna Furry 08/07/2021   Addendum 10:46 am: BP dropping. Turned off UF. Order albumin 25 g, midodrine 10 mg and lower dialysate temp. D/w HD nurse.   Katheran James,

## 2021-08-07 NOTE — Progress Notes (Signed)
OT Cancellation Note  Patient Details Name: William Flores MRN: [id number] DOB: 07-Sep-1950   Cancelled Treatment:    Reason Eval/Treat Not Completed: Patient at procedure or test/ unavailable.  Continue efforts as appropriate.  Dianah Pruett D Emanuela Runnion 08/07/2021, 11:34 AM 08/07/2021  RP, OTR/L  Acute Rehabilitation Services  Office:  [phone]

## 2021-08-08 DIAGNOSIS — I214 Non-ST elevation (NSTEMI) myocardial infarction: Secondary | ICD-10-CM | POA: Diagnosis not present

## 2021-08-08 LAB — GLUCOSE, CAPILLARY
Glucose-Capillary: 114 mg/dL — ABNORMAL HIGH (ref 70–99)
Glucose-Capillary: 118 mg/dL — ABNORMAL HIGH (ref 70–99)
Glucose-Capillary: 108 mg/dL — ABNORMAL HIGH (ref 70–99)
Glucose-Capillary: 91 mg/dL (ref 70–99)

## 2021-08-08 MED ORDER — LACTULOSE 10 GM/15ML PO SOLN
30.0000 g | Freq: Two times a day (BID) | ORAL | Status: DC
  Administered 2021-08-08 – 2021-08-09 (×2): 30 g via ORAL
  Filled 2021-08-08 (×2): qty 45

## 2021-08-08 MED ORDER — POLYETHYLENE GLYCOL 3350 17 G PO PACK
17.0000 g | PACK | Freq: Every day | ORAL | Status: DC
  Administered 2021-08-08 – 2021-08-10 (×3): 17 g via ORAL
  Filled 2021-08-08 (×3): qty 1

## 2021-08-08 MED ORDER — SIMETHICONE 80 MG PO CHEW
80.0000 mg | CHEWABLE_TABLET | Freq: Four times a day (QID) | ORAL | Status: DC | PRN
  Administered 2021-08-08: 80 mg via ORAL
  Filled 2021-08-08: qty 1

## 2021-08-08 NOTE — Progress Notes (Signed)
PROGRESS NOTE        PATIENT DETAILS Name: William Flores Age: 71 y.o. Sex: male Date of Birth: 12/22/1950 Admit Date: 08/04/2021 Admitting Physician Clance Boll, MD AYT:[id number], [name], MD  Brief Summary: Patient is a 71 y.o.  male with a history of ESRD on HD TTS, chronic hypoxic respiratory failure on home O2, pulmonary hypertension, Nash liver cirrhosis, DM-2 who presented with epigastric discomfort-found to have non-STEMI and subsequently admitted to the hospitalist service.  See below for further details.  Significant events: 8/2>> admit to [facility]-epigastric pain-non-STEMI. 8/4>> discussed with patient-DNR-agrees to pursue gentle medical treatment.  No LHC planned.  Significant studies: 8/2>> CXR:  Pulmonary edema.  Significant microbiology data: 8/2>> urine culture: ESBL E. Coli (colonization and not acute infection)  Procedures:   Consults: Cardiology, nephrology.Pall care  Subjective: No chest pain or shortness of breath.  Lying comfortably in bed.  Objective: Vitals: Blood pressure 105/69, pulse (!) 110, temperature 97.8 F (36.6 C), temperature source Oral, resp. rate 18, height 5' 4"  (1.626 m), weight 103 kg, SpO2 93 %.   Exam: Gen Exam:Alert awake-not in any distress HEENT:atraumatic, normocephalic Chest: B/L clear to auscultation anteriorly CVS:S1S2 regular Abdomen:soft non tender, non distended Extremities:no edema Neurology: Non focal Skin: no rash   Pertinent Labs/Radiology:    Latest Ref Rng & Units 08/07/2021   11:14 AM 08/07/2021    2:19 AM 08/06/2021    4:34 AM  CBC  WBC 4.0 - 10.5 K/uL 8.5  8.7  5.8   Hemoglobin 13.0 - 17.0 g/dL 11.1  11.5  10.7   Hematocrit 39.0 - 52.0 % 34.4  34.8  33.1   Platelets 150 - 400 K/uL 109  113  81     Lab Results  Component Value Date   NA 133 (L) 08/07/2021   K 4.1 08/07/2021   CL 96 (L) 08/07/2021   CO2 26 08/07/2021      Assessment/Plan:  Non-STEMI, ACS ruled out:   No chest pain-extensive discussion with patient this morning-plans are to continue gentle medical treatment without pursuing LHC and other invasive procedures.  Continue aspirin/statin - heparin drip discontinued  ESRD on HD TTS: Nephrology following-we will defer further to nephrology.  Fluid overload due to ESRD/acute on chronic RV failure/acute on chronic HFpEF: Volume removal with HD as above.    Chronic atrial fibrillation:  Rate controlled-Per prior notes-anticoagulation was discontinued in the setting of recurrent GI bleeding.  Given overall poor prognosis-history of cirrhosis/thrombocytopenia, and prior GI bleeding certainly reasonable to monitor off anticoagulation.  NASH/cirrhosis: Relatively well compensated-follow closely.  History of hepatic encephalopathy: Currently alert/awake-continue rifaximin/lactulose  Chronic hypoxic respiratory failure on home O2-2-3 L/min: Continue O2 at home regimen.  ?  Adrenal insufficiency: Appears to be on Solu-Cortef since his last hospitalization at [facility]-this will be continued.  Attending MD at SNF to taper off steroids at his/her discretion.  Asymptomatic bacteriuria: No indication of UTI-stop Rocephin-status post 1 dose of fosfomycin as patient is immunocompromised.  Hypoglycemia: Likely due to being off steroids/n.p.o. status-has resolved after initiation of usual steroid dosing-continue to follow.   Recent Labs    08/07/21 1610 08/07/21 2104 08/08/21 0509  GLUCAP 103* 236* 114*     Gerd/Constipation Increase PRN miralax and lactulose - follow clinically  Chronic hypotension: BP stable-continue midodrine.  Hypothyroidism: Continue Synthroid  Gout: No flare-continue allopurinol.  GERD: Continue PPI  Palliative care: Extensive discussion during this hospitalization-poor overall prognosis-not a candidate for aggressive care-previously followed by hospice at [facility] apparently was revoked several weeks back.  After extensive  discussion-DNR reinstated-patient aware he is not a candidate for aggressive care-plans are to reinitiate hospice follow-up at SNF.  Palliative care following.  Morbid Obesity: Estimated body mass index is 38.98 kg/m as calculated from the following:   Height as of this encounter: 5' 4"  (1.626 m).   Weight as of this encounter: 103 kg.   Code status:   Code Status: DNR   DVT Prophylaxis: heparin injection 5,000 Units Start: 08/07/21 1430 Family Communication: Daughter-[name]-[phone]-updated over the phone on 8/5   Disposition Plan: Status is: Inpatient Remains inpatient appropriate because: Non-STEMI-see above.   Planned Discharge Destination:Skilled nursing facility likely early next week on 8/7.   Diet: Diet Order             Diet renal with fluid restriction Fluid restriction: 1200 mL Fluid; Room service appropriate? Yes; Fluid consistency: Thin  Diet effective now                     Antimicrobial agents: Anti-infectives (From admission, onward)    Start     Dose/Rate Route Frequency Ordered Stop   08/06/21 1015  fosfomycin (MONUROL) packet 3 g        3 g Oral  Once 08/06/21 0922 08/06/21 1159   08/05/21 1800  cefTRIAXone (ROCEPHIN) 1 g in sodium chloride 0.9 % 100 mL IVPB  Status:  Discontinued        1 g 200 mL/hr over 30 Minutes Intravenous Every 24 hours 08/04/21 2314 08/06/21 0922   08/05/21 1159  rifaximin (XIFAXAN) tablet 550 mg        550 mg Oral Every 12 hours 08/05/21 1017     08/04/21 1815  cefTRIAXone (ROCEPHIN) 1 g in sodium chloride 0.9 % 100 mL IVPB        1 g 200 mL/hr over 30 Minutes Intravenous  Once 08/04/21 1808 08/04/21 1931        MEDICATIONS: Scheduled Meds:  allopurinol  200 mg Oral Daily   aspirin  81 mg Oral Daily   atorvastatin  80 mg Oral Daily   Chlorhexidine Gluconate Cloth  6 each Topical Q0600   cholecalciferol  5,000 Units Oral Daily   ferrous sulfate  325 mg Oral Daily   heparin injection (subcutaneous)   5,000 Units Subcutaneous Q8H   hydrocortisone  10 mg Oral BID   lactulose  30 g Oral Daily   levothyroxine  100 mcg Oral Q0600   midodrine  10 mg Oral TID WC   pantoprazole  40 mg Oral BID   rifaximin  550 mg Oral Q12H   Continuous Infusions:   PRN Meds:.acetaminophen, dextrose, LORazepam, nitroGLYCERIN, ondansetron (ZOFRAN) IV, oxyCODONE   I have personally reviewed following labs and imaging studies  LABORATORY DATA: CBC: Recent Labs  Lab 08/04/21 1455 08/05/21 0316 08/06/21 0434 08/07/21 0219 08/07/21 1114  WBC 5.6 5.5 5.8 8.7 8.5  NEUTROABS 3.8  --   --   --   --   HGB 11.3* 11.2* 10.7* 11.5* 11.1*  HCT 34.7* 34.8* 33.1* 34.8* 34.4*  MCV 99.4 99.7 99.4 98.6 99.7  PLT 83* 74* 81* 113* 109*     Basic Metabolic Panel: Recent Labs  Lab 08/03/21 0707 08/04/21 1455 08/04/21 2208 08/05/21 0829 08/05/21 1941 08/07/21 1113  NA 137 132*  --  132* 134* 133*  K 4.1 4.1  --  5.0 3.7 4.1  CL 100 98  --  97* 95* 96*  CO2  --  22  --  21* 28 26  GLUCOSE 101* 85  --  74 112* 107*  BUN 56* 40*  --  48* 22 41*  CREATININE 7.10* 5.29*  --  5.93* 3.73* 5.47*  CALCIUM  --  9.0  --  8.8* 8.6* 8.7*  MG  --   --  2.0  --   --   --   PHOS  --   --   --   --  3.1 3.9     GFR: Estimated Creatinine Clearance: 13.8 mL/min (A) (by C-G formula based on SCr of 5.47 mg/dL (H)).  Liver Function Tests: Recent Labs  Lab 08/04/21 1455 08/05/21 1941 08/07/21 1113  AST 27  --   --   ALT 15  --   --   ALKPHOS 69  --   --   BILITOT 1.1  --   --   PROT 6.2*  --   --   ALBUMIN 2.9* 2.7* 2.7*    Recent Labs  Lab 08/04/21 1455  LIPASE 18    No results for input(s): "AMMONIA" in the last 168 hours.  Coagulation Profile: Recent Labs  Lab 08/05/21 0316  INR 1.5*     Cardiac Enzymes: No results for input(s): "CKTOTAL", "CKMB", "CKMBINDEX", "TROPONINI" in the last 168 hours.  BNP (last 3 results) No results for input(s): "PROBNP" in the last 8760 hours.  Lipid  Profile: No results for input(s): "CHOL", "HDL", "LDLCALC", "TRIG", "CHOLHDL", "LDLDIRECT" in the last 72 hours.   Thyroid Function Tests: No results for input(s): "TSH", "T4TOTAL", "FREET4", "T3FREE", "THYROIDAB" in the last 72 hours.   Anemia Panel: No results for input(s): "VITAMINB12", "FOLATE", "FERRITIN", "TIBC", "IRON", "RETICCTPCT" in the last 72 hours.  Urine analysis:    Component Value Date/Time   COLORURINE BROWN (A) 08/04/2021 1550   APPEARANCEUR TURBID (A) 08/04/2021 1550   LABSPEC 1.025 08/04/2021 1550   PHURINE 6.5 08/04/2021 1550   GLUCOSEU 100 (A) 08/04/2021 1550   HGBUR LARGE (A) 08/04/2021 1550   BILIRUBINUR MODERATE (A) 08/04/2021 1550   KETONESUR 15 (A) 08/04/2021 1550   PROTEINUR >300 (A) 08/04/2021 1550   UROBILINOGEN 0.2 05/16/2014 0822   NITRITE POSITIVE (A) 08/04/2021 1550   LEUKOCYTESUR LARGE (A) 08/04/2021 1550    Sepsis Labs: Lactic Acid, Venous    Component Value Date/Time   LATICACIDVEN 1.7 05/04/2021 1228    MICROBIOLOGY: Recent Results (from the past 240 hour(s))  Urine Culture     Status: Abnormal   Collection Time: 08/04/21  3:50 PM   Specimen: Urine, Catheterized  Result Value Ref Range Status   Specimen Description   Final    URINE, CATHETERIZED Performed at [facility], [address], [place], [place] [address]    Special Requests   Final    NONE Performed at [facility], [address], [place], [place] [address]    Culture (A)  Final    >=100,000 COLONIES/mL ESCHERICHIA COLI Confirmed Extended Spectrum Beta-Lactamase Producer (ESBL).  In bloodstream infections from ESBL organisms, carbapenems are preferred over piperacillin/tazobactam. They are shown to have a lower risk of mortality.    Report Status 08/06/2021 FINAL  Final   Organism ID, Bacteria ESCHERICHIA COLI (A)  Final      Susceptibility   Escherichia coli - MIC*    AMPICILLIN >=32 RESISTANT Resistant  CEFAZOLIN >=64 RESISTANT Resistant     CEFEPIME 16  RESISTANT Resistant     CEFTRIAXONE >=64 RESISTANT Resistant     CIPROFLOXACIN >=4 RESISTANT Resistant     GENTAMICIN >=16 RESISTANT Resistant     IMIPENEM <=0.25 SENSITIVE Sensitive     NITROFURANTOIN 128 RESISTANT Resistant     TRIMETH/SULFA >=320 RESISTANT Resistant     AMPICILLIN/SULBACTAM 16 INTERMEDIATE Intermediate     PIP/TAZO 16 SENSITIVE Sensitive     * >=100,000 COLONIES/mL ESCHERICHIA COLI    RADIOLOGY STUDIES/RESULTS: No results found.   LOS: 4 days   Little Ishikawa, DO  [facility]    To contact the attending provider between 7A-7P or the covering provider during after hours 7P-7A, please log into the web site [email or web address] and access using universal [place] password for that web site. If you do not have the password, please call the hospital operator.  08/08/2021, 7:38 AM

## 2021-08-08 NOTE — Progress Notes (Signed)
OT Cancellation Note  Patient Details Name: William Flores MRN: [id number] DOB: [birth year]   Cancelled Treatment:    Reason Eval/Treat Not Completed: Patient declined, no reason specified.  Patient requesting to wait until Monday for OT.    Ritta Hammes D Ellar Hakala 08/08/2021, 11:00 AM 08/08/2021  RP, OTR/L  Acute Rehabilitation Services  Office:  [phone]

## 2021-08-08 NOTE — Progress Notes (Signed)
[facility] NEPHROLOGY PROGRESS NOTE  Assessment/ Plan: Pt is a 71 y.o. yo male  with PMH significant for hypertension, DM, OSA, A-fib, NASH liver cirrhosis, thrombocytopenia, GI bleed, severe pulmonary hypertension with RV failure, chronic hypoxic respiratory failure on home oxygen, colon cancer status post resection, CAD, ESRD on HD TTS at [facility] who was transferred from [facility] for NSTEMI and possible cardiac cath, seen as a consultation for the management of dialysis.  Dialysis Orders from previous admission:  [facility], TTS 4hr EDW 108kg 400/500 2/2.5 T35 Heparin 0 Mircera 268mg q2weeks    # #NSTEMI/chest pain: Seen by cardiologist.  Noted patient is now DNR and plan for SNF hospice care.  No procedures planned by cardiology.   # [facility]  He had dialysis yesterday with 1.3 L ultrafiltration.  Developed intradialytic hypotension requiring reduction of the UF goal, lowering dialysate temperature and added albumin and midodrine.  Next HD on 8/8.  Right IJ for the access.       #Dialysis Access: Using right IJ TDC.  He had right upper extremity AV graft placed by Dr. EDonnetta Hutchingon 8/1.  The wound healing good.   #Volume/blood pressure: Blood pressure acceptable.  On midodrine for hypotension.   # Anemia of ESRD: Hemoglobin at goal.  No need for ESA.   # Metabolic Bone Disease: Monitor calcium and phosphorus level.  #Chronic A-fib: Fluctuation in heart rate.  No anticoagulation because of GI bleed and thrombocytopenia.  Subjective: Seen and examined.  No new event.  He is trying to sleep as he could not sleep last night.  Fluctuation in heart rate.  Denies chest pain or shortness of breath.   Objective Vital signs in last 24 hours: Vitals:   08/07/21 2300 08/08/21 0004 08/08/21 0407 08/08/21 0715  BP:  96/69 126/76 105/69  Pulse:  (!) 108 (!) 106 (!) 110  Resp:  20 18 18   Temp:  98.4 F (36.9 C) 98.8 F (37.1 C) 97.8 F (36.6 C)   TempSrc: Oral Oral Oral Oral  SpO2:  93% 92% 93%  Weight:   103 kg   Height:       Weight change: 0 kg  Intake/Output Summary (Last 24 hours) at 08/08/2021 1026 Last data filed at 08/07/2021 1900 Gross per 24 hour  Intake 100 ml  Output 0 ml  Net 100 ml        Labs: RENAL PANEL Recent Labs    11/30/20 0320 11/30/20 1510 12/01/20 0330 12/01/20 0335 12/02/20 0458 12/02/20 1338 12/03/20 0504 12/03/20 1000 12/03/20 1712 12/04/20 0418 12/04/20 1745 12/05/20 0410 12/06/20 0411 12/07/20 0649 12/08/20 1055 12/09/20 0953 03/02/21 0831 04/24/21 0425 04/26/21 0343 05/04/21 1228 05/05/21 0750 05/06/21 0410 05/07/21 0436 05/07/21 1417 05/08/21 0429 05/09/21 0520 05/10/21 0430 05/13/21 0439 05/14/21 1150 05/15/21 1450 05/27/21 1609 08/03/21 0707 08/04/21 1455 08/04/21 2208 08/05/21 0829 08/05/21 1941 08/07/21 1113  NA 135   < >  --    < > 132*   < > 132* 132*   < > 134*   < > 131* 132* 128* 130* 132*   < > 136 136   < > 136 136 133* 133*   < > 134* 133* 131* 131* 132* 136 137 132*  --  132* 134* 133*  K 4.5   < >  --    < > 5.0   < > 4.4 4.4   < > 4.2   < > 4.5 4.3 4.6  4.4 5.0   < > 3.8 3.7   < > 4.8 5.3* 4.0 4.3   < > 3.4* 4.5 3.9 3.8 4.2 4.6 4.1 4.1  --  5.0 3.7 4.1  CL 102   < >  --    < > 102   < > 100 102   < > 102   < > 100 97* 95* 96* 98   < > 103 103   < > 103 105 101 100   < > 99 100 97* 97* 97* 101 100 98  --  97* 95* 96*  CO2 27   < >  --    < > 24   < > 27 24   < > 27   < > 25 27 24 25 24    < > 25 25   < > 19* 18* 21* 23   < > 26 21* 24 22 25 26   --  22  --  21* 28 26  GLUCOSE 117*   < >  --    < > 158*   < > 144* 180*   < > 115*   < > 157* 141* 160* 100* 120*   < > 78 92   < > 113* 141* 130* 130*   < > 128* 192* 213* 195* 161* 88 101* 85  --  74 112* 107*  BUN 20   < >  --    < > 16   < > 22 22   < > 26*   < > 49* 38* 74* 59* 77*   < > 37* 31*   < > 47* 56* 32* 35*   < > 20 28* 38* 35* 51* 68* 56* 40*  --  48* 22 41*  CREATININE 1.81*   < >  --    < >  1.36*   < > 1.95* 1.82*   < > 1.81*   < > 2.86* 2.44* 3.80* 3.87* 5.01*   < > 5.46* 5.82*   < > 7.19* 7.94* 5.37* 6.02*   < > 4.60* 5.89* 6.35* 5.36* 6.85* 8.66* 7.10* 5.29*  --  5.93* 3.73* 5.47*  CALCIUM 8.1*   < >  --    < > 8.4*   < > 8.6* 8.2*   < > 8.7*   < > 8.6* 8.6* 8.5* 8.6* 8.7*   < > 8.2* 7.9*   < > 8.4* 8.3* 7.8* 7.9*   < > 8.2* 8.5* 8.5* 8.2* 8.3* 8.5*  --  9.0  --  8.8* 8.6* 8.7*  MG 2.4  --  2.3  --  2.4  --  2.4 2.4  --  2.6*  --  2.6* 2.2  --   --   --   --   --   --   --  2.3  --   --   --   --   --   --   --   --   --   --   --   --  2.0  --   --   --   PHOS 3.2   < >  --    < > 3.2   < > 2.1*  --    < > 1.9*   < > 4.7* 4.5 5.3* 4.3 5.9*  --  5.1* 4.8*  --   --   --   --  5.3*  --   --   --  5.4*  --  6.6*  --   --   --   --   --  3.1 3.9  ALBUMIN 2.7*   < >  --    < > 2.9*   < > 3.0*  --    < > 3.3*   < > 3.2* 3.3* 3.3* 3.2* 3.3*   < > 2.4* 2.2*   < > 3.2* 2.9* 2.7* 2.5*  --   --  2.8* 3.2* 3.4* 3.3*  --   --  2.9*  --   --  2.7* 2.7*   < > = values in this interval not displayed.      Liver Function Tests: Recent Labs  Lab 08/04/21 1455 08/05/21 1941 08/07/21 1113  AST 27  --   --   ALT 15  --   --   ALKPHOS 69  --   --   BILITOT 1.1  --   --   PROT 6.2*  --   --   ALBUMIN 2.9* 2.7* 2.7*    Recent Labs  Lab 08/04/21 1455  LIPASE 18    No results for input(s): "AMMONIA" in the last 168 hours. CBC: Recent Labs    10/14/20 1439 10/15/20 0538 11/12/20 1537 11/20/20 1128 11/26/20 1410 11/27/20 0500 04/22/21 1500 04/22/21 2138 08/04/21 1455 08/05/21 0316 08/06/21 0434 08/07/21 0219 08/07/21 1114  HGB  --    < > 9.0*   < >  --    < >  --    < > 11.3* 11.2* 10.7* 11.5* 11.1*  MCV  --    < > 90.8   < >  --    < >  --    < > 99.4 99.7 99.4 98.6 99.7  VITAMINB12 691  --   --   --  509  --  852  --   --   --   --   --   --   FOLATE 8.6  --   --   --  8.1  --  11.1  --   --   --   --   --   --   FERRITIN 25  --  45  --  31  --  135  --   --   --   --   --    --   TIBC 476*  --  355  --  389  --  314  --   --   --   --   --   --   IRON 15*  --  22*  --  17*  --  79  --   --   --   --   --   --   RETICCTPCT 2.4  --   --   --  2.0  --  5.1*  --   --   --   --   --   --    < > = values in this interval not displayed.     Cardiac Enzymes: No results for input(s): "CKTOTAL", "CKMB", "CKMBINDEX", "TROPONINI" in the last 168 hours. CBG: Recent Labs  Lab 08/06/21 0758 08/06/21 1126 08/07/21 1610 08/07/21 2104 08/08/21 0509  GLUCAP 128* 153* 103* 236* 114*     Iron Studies: No results for input(s): "IRON", "TIBC", "TRANSFERRIN", "FERRITIN" in the last 72 hours. Studies/Results: No results found.  Medications: Infusions:    Scheduled Medications:  allopurinol  200 mg Oral Daily   aspirin  81 mg Oral Daily  atorvastatin  80 mg Oral Daily   Chlorhexidine Gluconate Cloth  6 each Topical Q0600   cholecalciferol  5,000 Units Oral Daily   ferrous sulfate  325 mg Oral Daily   heparin injection (subcutaneous)  5,000 Units Subcutaneous Q8H   hydrocortisone  10 mg Oral BID   lactulose  30 g Oral BID   levothyroxine  100 mcg Oral Q0600   midodrine  10 mg Oral TID WC   pantoprazole  40 mg Oral BID   polyethylene glycol  17 g Oral Daily   rifaximin  550 mg Oral Q12H    have reviewed scheduled and prn medications.  Physical Exam: General:NAD, comfortable Heart:RRR, s1s2 nl Lungs:clear b/l, no crackle Abdomen:soft, Non-tender, non-distended Extremities:No edema Dialysis Access: Right IJ TDC in place.  Cartier Washko Prasad Tammi Boulier 08/08/2021,10:26 AM  LOS: 4 days

## 2021-08-09 DIAGNOSIS — I214 Non-ST elevation (NSTEMI) myocardial infarction: Secondary | ICD-10-CM | POA: Diagnosis not present

## 2021-08-09 LAB — GLUCOSE, CAPILLARY
Glucose-Capillary: 98 mg/dL (ref 70–99)
Glucose-Capillary: 122 mg/dL — ABNORMAL HIGH (ref 70–99)
Glucose-Capillary: 130 mg/dL — ABNORMAL HIGH (ref 70–99)
Glucose-Capillary: 143 mg/dL — ABNORMAL HIGH (ref 70–99)
Glucose-Capillary: 153 mg/dL — ABNORMAL HIGH (ref 70–99)
Glucose-Capillary: 81 mg/dL (ref 70–99)
Glucose-Capillary: 86 mg/dL (ref 70–99)

## 2021-08-09 MED ORDER — LACTULOSE 10 GM/15ML PO SOLN
30.0000 g | Freq: Three times a day (TID) | ORAL | Status: DC
  Administered 2021-08-09 – 2021-08-10 (×3): 30 g via ORAL
  Filled 2021-08-09 (×4): qty 45

## 2021-08-09 MED ORDER — GUAIFENESIN-DM 100-10 MG/5ML PO SYRP
15.0000 mL | ORAL_SOLUTION | ORAL | Status: DC | PRN
  Administered 2021-08-09: 15 mL via ORAL
  Filled 2021-08-09: qty 15

## 2021-08-09 MED ORDER — CHLORHEXIDINE GLUCONATE CLOTH 2 % EX PADS
6.0000 | MEDICATED_PAD | Freq: Every day | CUTANEOUS | Status: DC
  Administered 2021-08-10: 6 via TOPICAL

## 2021-08-09 NOTE — Progress Notes (Signed)
OT Cancellation Note  Patient Details Name: William Flores MRN: [id number] DOB: 03-29-1950   Cancelled Treatment:    Reason Eval/Treat Not Completed: Fatigue/lethargy limiting ability to participate. Just read PT note from 17 minutes ago--Pt reports fatigue and lethargy, declining mobility at this time. OT will sign off if pt refuses again tomorrow. SW in room just asked pt about working with OT and he politely declined.  [place], OTR/L [facility] [phone] Office [phone]    Almon Register 08/09/2021, 1:12 PM

## 2021-08-09 NOTE — Progress Notes (Signed)
PT Cancellation Note  Patient Details Name: William Flores MRN: [id number] DOB: 04-09-1950   Cancelled Treatment:    Reason Eval/Treat Not Completed: Fatigue/lethargy limiting ability to participate. Pt reports fatigue and lethargy, declining mobility at this time. PT will sign off if pt refuses again tomorrow.   Zenaida Niece 08/09/2021, 12:55 PM

## 2021-08-09 NOTE — Progress Notes (Signed)
[facility] NEPHROLOGY PROGRESS NOTE  Assessment/ Plan: Pt is a 71 y.o. yo male  with PMH significant for hypertension, DM, OSA, A-fib, NASH liver cirrhosis, thrombocytopenia, GI bleed, severe pulmonary hypertension with RV failure, chronic hypoxic respiratory failure on home oxygen, colon cancer status post resection, CAD, ESRD on HD TTS at [facility] who was transferred from [facility] for NSTEMI and possible cardiac cath, seen as a consultation for the management of dialysis.  Dialysis Orders from previous admission:  [facility]  4h   108kg    400/500   2/2.5 bath Hep none   RIJ TDC/  RUE AVG maturing - mircera 277mg q2weeks   Assessment/ Plan #NSTEMI/chest pain: Seen by cardiologist.  Noted patient is now DNR and plan for SNF hospice care.  No procedures planned by cardiology.   # [facility]  Next HD tomorrow.     #Dialysis Access: Using right IJ TDC.  He had right upper extremity AV graft placed by Dr. EDonnetta Hutchingon 8/1.  Healing good.   #Volume/blood pressure: Blood pressures on the low side, 90s today.  On midodrine for hypotension.   # Anemia of ESRD: Hemoglobin at goal.  No need for ESA.   # Metabolic Bone Disease: Monitor calcium and phosphorus level.  #Chronic A-fib: Fluctuation in heart rate.  No anticoagulation because of GI bleed and thrombocytopenia.  # Deconditioning - significant, per PT/ pmd  RKelly Splinter MD 08/09/2021, 1:47 PM    Subjective: Seen in room. No new c/o's.    Objective Vital signs in last 24 hours: Vitals:   08/08/21 2247 08/09/21 0142 08/09/21 0544 08/09/21 0840  BP:  97/84 (!) 89/53 93/60  Pulse: (!) 111 (!) 108 (!) 121 (!) 107  Resp:  20 18 20   Temp:  (!) 97.2 F (36.2 C) 97.7 F (36.5 C)   TempSrc:  Oral Oral   SpO2:  96% 93% 92%  Weight:  101.7 kg    Height:       Medications:  Scheduled Medications:  allopurinol  200 mg Oral Daily   aspirin  81 mg Oral Daily    atorvastatin  80 mg Oral Daily   Chlorhexidine Gluconate Cloth  6 each Topical Q0600   cholecalciferol  5,000 Units Oral Daily   ferrous sulfate  325 mg Oral Daily   heparin injection (subcutaneous)  5,000 Units Subcutaneous Q8H   hydrocortisone  10 mg Oral BID   lactulose  30 g Oral TID   levothyroxine  100 mcg Oral Q0600   midodrine  10 mg Oral TID WC   pantoprazole  40 mg Oral BID   polyethylene glycol  17 g Oral Daily   rifaximin  550 mg Oral Q12H    have reviewed scheduled and prn medications.  Physical Exam: General:NAD, chron ill appearing older WM, deconditioned significantly Heart:RRR, s1s2 nl Lungs:clear b/l, no crackle Abdomen:soft, Non-tender, non-distended Extremities: 2+ bilat hip edema, no other edema Dialysis Access: Right IJ TDC in place.  RSandy SalaamSchertz 08/09/2021,1:45 PM  LOS: 5 days

## 2021-08-09 NOTE — Progress Notes (Deleted)
Cardiology Office Note   Date:  08/09/2021   ID:  William Flores, DOB 1950-08-02, MRN [id number]  PCP:  [name], [name], MD  Cardiologist:   Minus Breeding, MD Referring:  ***  No chief complaint on file.     History of Present Illness: William Flores is a 71 y.o. male who presents for follow up of chronic atrial fibrillation, chronic anticoagulation therapy w/ coumadin, h/o systolic HF (felt to be tachy-mediated, w/ eventual recovery of LVEF), chronic diastolic CHF, RV failure w/ pulmonary HTN, Diabetes, Obesity, OHS/OSA w/ poor compliance w/ CPAP, h/o AKI, h/o GIB, colon cancer treated w/ resection and h/o MGUS.     Patient was admitted in 02/2014 with hypoxic respiratory failure with acute systolic CHF and possible PNA.  He was in atrial fibrillation with RVR and developed AKI. Cardiomyopathy (EF 35-40% by echo in 2016) thought to be tachycardia-mediated at the time.  He was admitted again in 4/16 with hypotension and AKI as well as multiple metabolic derangements.  He required CVVH.  Echo at that time showed EF 60-65%.  He was again admitted in 5/16, again with hypoxic respiratory failure and was intubated.  EF 60-65% by echo on this admission.  He again had AKI and had HD x 1. Renal function again recovered. Also, per records, he had a LHC in 2008 that showed no significant CAD.    Had repeat echo 12/2018 which showed normal LVEF 50-55% w/ moderate LVH, normal LA size, mild MR, severely elevated pulmonary artery systolic pressure, 77 mmHg and moderately reduced right ventricular systolic function. Echo was obtained while admitted for acute anemia 2/2 acute GIB, in the setting of supratherpaeutic INR. INR was > 10.  Hgb 5.1. INR was reversed w/ Kcentra + Vit K. Treated w/ transfusion. Underwent GI w/u. EGD showed chronic gastritis but no clear source of bleeding. Biopsy negative for H Pylori. Colonoscopy showed several polyps, removed and biopsied. No high grade dysplasia. He was placed  back on Coumadin. INRs followed by PCP.    Admitted to [facility] for acute on chronic CHF 3/21. Also w/ AKI on admit. SCr was 1.8 (baseline 1.3-1.5). He was started on IV lasix for diuresis, 60 mg bid. Diuresis sluggish and eventually changed to lasix gtt. SCr has worsened w/ attempts at diuresis, w/ SCr rising to 2.3. Echo repeated. LVEF 55-60%. RV severely enlarged w/ severely reduced RV systolic function. Pulmonary artery systolic pressure severely elevated at 76 mmhg. There is mild TR. Also mild to moderate aortic valve sclerosis/calcification is present, without any evidence of aortic stenosis. He was subsequently transferred to [facility] for further management of his HF w/ formal RHC and AHF consultation.  On arrival to [facility], He was placed on high dose IV Lasix w/ better urinary response/ diuresis. RHC 4/8 with elevated filling pressures, mixed pulmonary venous/pulmonary hypertension. V/Q scan negative for chronic PE. He was continued on IV diuretics and diuresed > 20 lb and placed on PO torsemide 40 mg bid.  SCr improved w/ diuresis. Also started on trial of sildenafil 20 mg tid. Advised to continue supplemental home O2 and to restart regular use of CPAP. Also of note, a multiple myeloma panel was done given MGUS and showed elevated M spike protein. Urine immunofixation and UPEP both unremarkable. Discharge wt was 261 lb.   Patient was admitted in 6/22 with syncope.  He was at his PCP's office, stood up, and passed out.  AED was applied, no shock. CPR was done with  ROSC < 1 minute. Echo showed EF 50-55%, mild LVH, D-shaped septum, severe RV enlargement with severely decreased RV systolic function, PASP 90, IVC dilated.  He was discharged to a rehab facility in [place]. He was supposed to get a Zio monitor at discharge but this was not done.  Creatinine was elevated to 2.5 during this hospitalization.   In 10/22, he was admitted with GI bleeding/hematochezia.  He had large, oozing rectal polyps with high grade  dysplasia.    Today he returns for HF follow up. He lives in a SNF in [place].  He is not using CPAP. He wears oxygen all the time.  He is minimally active, gets around in wheelchair for the most part. Dyspnea with most exertion.  No chest pain.  +Orthopnea.  No lightheadedness.     Past Medical History:  Diagnosis Date   Anemia    Anxiety    Asthma    CAD (coronary artery disease)    LHC 2/08:  mRCA 50%, o/w no sig CAD, EF 25%   Cardiomyopathy ([place])    EF 35-40% in 2/16 in setting of AF with RVR >> echo 5/16 with improved LVF with EF 65-70%   CHF (congestive heart failure) (HCC)    Chronic atrial fibrillation (HCC)    Chronic diastolic heart failure (HCC)    HFPF - EF ~65-70% (OFTEN EXACERBATED BY AFIB)   Cirrhosis of liver (HCC)    CKD (chronic kidney disease)    hx of worsening renal failure and hyperK+ in setting of acute diast CHF >> required CVVHD in 4/16 and dialysis x 1 in 5/16   CKD stage 3 due to type 2 diabetes mellitus ([place]) 09/17/2015   Colon cancer ([place])    Colon polyps 09/21/2012   Tubular adenoma   Diabetes (HCC)    GERD (gastroesophageal reflux disease)    Heart murmur    Hyperlipidemia    Hypertension    Hypothyroidism    Insomnia    Nonalcoholic steatohepatitis    Obesity hypoventilation syndrome ([place])    Pulmonary hypertension ([place])    Renal insufficiency    Sleep apnea    Thrombocytopenia ([place])     Past Surgical History:  Procedure Laterality Date   AV FISTULA PLACEMENT Right 03/02/2021   Procedure: RIGHT ARM ARTERIOVENOUS (AV) FISTULA CREATION;  Surgeon: [name], MD;  Location: [facility];  Service: Vascular;  Laterality: Right;   AV FISTULA PLACEMENT Right 08/03/2021   Procedure: INSERTION OF RIGHT UPPER ARM ARTERIOVENOUS (AV) GORE-TEX GRAFT;  Surgeon: [name], MD;  Location: [facility];  Service: Vascular;  Laterality: Right;   CARDIAC ELECTROPHYSIOLOGY MAPPING AND ABLATION  05/2021   CIRCUMCISION     COLON RESECTION     COLONOSCOPY N/A  09/21/2012   Procedure: COLONOSCOPY;  Surgeon: Inda Castle, MD;  Location: [facility];  Service: Endoscopy;  Laterality: N/A;   COLONOSCOPY N/A 12/28/2018   Procedure: COLONOSCOPY;  Surgeon: Rogene Houston, MD;  Location[facility];  Service: Endoscopy;  Laterality: N/A;   COLONOSCOPY WITH PROPOFOL N/A 10/17/2020   Procedure: COLONOSCOPY WITH PROPOFOL;  Surgeon: Harvel Quale, MD;  Location[facility];  Service: Gastroenterology;  Laterality: N/A;   COLONOSCOPY WITH PROPOFOL N/A 04/25/2021   Procedure: COLONOSCOPY WITH PROPOFOL;  Surgeon: Rogene Houston, MD;  [facility];  Service: Endoscopy;  Laterality: N/A;   ENTEROSCOPY N/A 06/24/2020   Procedure: ENTEROSCOPY;  Surgeon: Gatha Mayer, MD;  Location: [place];  Service: Endoscopy;  Laterality: N/A;   ESOPHAGOGASTRODUODENOSCOPY N/A 12/28/2018   Procedure: ESOPHAGOGASTRODUODENOSCOPY (EGD);  Surgeon: Rogene Houston, MD;  [facility];  Service: Endoscopy;  Laterality: N/A;   HOT HEMOSTASIS  04/25/2021   Procedure: HOT HEMOSTASIS (ARGON PLASMA COAGULATION/BICAP);  Surgeon: Rogene Houston, MD;  [facility];  Service: Endoscopy;;   IR FLUORO GUIDE CV LINE RIGHT  12/10/2020   IR US GUIDE [place] RIGHT  12/11/2020   POLYPECTOMY  10/17/2020   Procedure: POLYPECTOMY;  Surgeon: Harvel Quale, MD;  [facility];  Service: Gastroenterology;;  rectal, transverse, descending, ascending, sigmoid   RIGHT HEART CATH N/A 04/11/2019   Procedure: RIGHT HEART CATH;  Surgeon: Larey Dresser, MD;  Location: [place] [facility];  Service: Cardiovascular;  Laterality: N/A;   US ECHOCARDIOGRAPHY  03/04/2010   abnormal study     No current facility-administered medications for this visit.   No current outpatient medications on file.   Facility-Administered Medications Ordered in Other Visits  Medication Dose Route Frequency Provider Last Rate Last Admin   0.9  %  sodium chloride infusion  100 mL Intravenous PRN Dwana Melena, MD       0.9 %  sodium chloride infusion  100 mL Intravenous PRN Dwana Melena, MD       acetaminophen (TYLENOL) tablet 650 mg  650 mg Oral Q4H PRN Clance Boll, MD   650 mg at 08/07/21 0855   allopurinol (ZYLOPRIM) tablet 200 mg  200 mg Oral Daily Jonetta Osgood, MD   200 mg at 08/09/21 1143   alteplase (CATHFLO ACTIVASE) injection 2 mg  2 mg Intracatheter Once PRN Dwana Melena, MD       aspirin chewable tablet 81 mg  81 mg Oral Daily Jonetta Osgood, MD   81 mg at 08/09/21 1144   atorvastatin (LIPITOR) tablet 80 mg  80 mg Oral Daily Janina Mayo, MD   80 mg at 08/08/21 1044   Chlorhexidine Gluconate Cloth 2 % PADS 6 each  6 each Topical Q0600 Rosita Fire, MD   6 each at 08/09/21 0523   [START ON 08/10/2021] Chlorhexidine Gluconate Cloth 2 % PADS 6 each  6 each Topical Q0600 Roney Jaffe, MD       cholecalciferol (VITAMIN D3) 25 MCG (1000 UNIT) tablet 5,000 Units  5,000 Units Oral Daily Jonetta Osgood, MD   5,000 Units at 08/08/21 1044   dextrose 50 % solution 25 mL  25 mL Intravenous PRN Jonetta Osgood, MD   25 mL at 08/05/21 1220   ferrous sulfate tablet 325 mg  325 mg Oral Daily Jonetta Osgood, MD   325 mg at 08/08/21 1044   guaiFENesin-dextromethorphan (ROBITUSSIN DM) 100-10 MG/5ML syrup 15 mL  15 mL Oral Q4H PRN Little Ishikawa, MD   15 mL at 08/09/21 2054   heparin injection 1,000 Units  1,000 Units Dialysis PRN Dwana Melena, MD       heparin injection 5,000 Units  5,000 Units Subcutaneous Q8H Jonetta Osgood, MD   5,000 Units at 08/09/21 2053   hydrocortisone (CORTEF) tablet 10 mg  10 mg Oral BID Jonetta Osgood, MD   10 mg at 08/09/21 2046   lactulose (CHRONULAC) 10 GM/15ML solution 30 g  30 g Oral TID Little Ishikawa, MD   30 g at 08/09/21 2053   levothyroxine (SYNTHROID) tablet 100 mcg  100 mcg Oral Q0600 Ghimire,  Henreitta Leber, MD   100 mcg at 08/09/21 0523   lidocaine  (PF) (XYLOCAINE) 1 % injection 5 mL  5 mL Intradermal PRN Dwana Melena, MD       lidocaine-prilocaine (EMLA) cream 1 application.  1 application  Topical PRN Dwana Melena, MD       LORazepam (ATIVAN) 2 MG/ML concentrated solution 0.5-1 mg  0.5-1 mg Oral Q4H PRN Cooper, Josseline P, PA-C   1 mg at 08/07/21 2245   midodrine (PROAMATINE) tablet 10 mg  10 mg Oral TID WC Ghimire, Henreitta Leber, MD   10 mg at 08/09/21 1614   nitroGLYCERIN (NITROSTAT) SL tablet 0.4 mg  0.4 mg Sublingual Q5 Min x 3 PRN Clance Boll, MD       ondansetron [facility]) injection 4 mg  4 mg Intravenous Q6H PRN Myles Rosenthal A, MD   4 mg at 08/08/21 0741   oxyCODONE (Oxy IR/ROXICODONE) immediate release tablet 5 mg  5 mg Oral Q6H PRN Jonetta Osgood, MD   5 mg at 08/07/21 2246   pantoprazole (PROTONIX) EC tablet 40 mg  40 mg Oral BID Jonetta Osgood, MD   40 mg at 08/09/21 2047   pentafluoroprop-tetrafluoroeth (GEBAUERS) aerosol 1 application.  1 application  Topical PRN Dwana Melena, MD       polyethylene glycol [facility] Floria Raveling) packet 17 g  17 g Oral Daily Little Ishikawa, MD   17 g at 08/09/21 1140   rifaximin [facility]) tablet 550 mg  550 mg Oral Q12H Jonetta Osgood, MD   550 mg at 08/09/21 2046   simethicone (MYLICON) chewable tablet 80 mg  80 mg Oral Q6H PRN Little Ishikawa, MD   80 mg at 08/08/21 1043    Allergies:   Codeine, Diclofenac, Ibuprofen, Indomethacin, Meloxicam, and Naproxen    Social History:  The patient  reports that he quit smoking about 38 years ago. His smoking use included cigarettes. He has never used smokeless tobacco. He reports that he does not drink alcohol and does not use drugs.   Family History:  The patient's ***family history includes Breast cancer in his mother; Diabetes in his mother; Heart attack in his father.    ROS:  Please see the history of present illness.   Otherwise, review of systems are positive for {NONE DEFAULTED:18576}.   All other systems are  reviewed and negative.    PHYSICAL EXAM: VS:  There were no vitals taken for this visit. , BMI There is no height or weight on file to calculate BMI. GENERAL:  Well appearing HEENT:  Pupils equal round and reactive, fundi not visualized, oral mucosa unremarkable NECK:  No jugular venous distention, waveform within normal limits, carotid upstroke brisk and symmetric, no bruits, no thyromegaly LYMPHATICS:  No cervical, inguinal adenopathy LUNGS:  Clear to auscultation bilaterally BACK:  No CVA tenderness CHEST:  Unremarkable HEART:  PMI not displaced or sustained,S1 and S2 within normal limits, no S3, no S4, no clicks, no rubs, *** murmurs ABD:  Flat, positive bowel sounds normal in frequency in pitch, no bruits, no rebound, no guarding, no midline pulsatile mass, no hepatomegaly, no splenomegaly EXT:  2 plus pulses throughout, no edema, no cyanosis no clubbing SKIN:  No rashes no nodules NEURO:  Cranial nerves II through XII grossly intact, motor grossly intact throughout PSYCH:  Cognitively intact, oriented to person place and time    EKG:  EKG {ACTION; IS/IS YQM:[id number]} ordered today. The ekg ordered today  demonstrates ***   Recent Labs: 08/04/2021: ALT 15; B Natriuretic Peptide 1,412.6; Magnesium 2.0 08/05/2021: TSH 3.124 08/07/2021: BUN 41; Creatinine, Ser 5.47; Hemoglobin 11.1; Platelets 109; Potassium 4.1; Sodium 133    Lipid Panel    Component Value Date/Time   CHOL 97 08/05/2021 0316   CHOL 103 03/19/2020 1311   TRIG 63 08/05/2021 0316   TRIG 177 (H) 07/18/2013 1547   HDL 27 (L) 08/05/2021 0316   HDL 20 (L) 03/19/2020 1311   HDL 24 (L) 07/18/2013 1547   CHOLHDL 3.6 08/05/2021 0316   VLDL 13 08/05/2021 0316   LDLCALC 57 08/05/2021 0316   LDLCALC 58 03/19/2020 1311   LDLCALC 67 07/18/2013 1547   LDLDIRECT 57 05/01/2017 1530      Wt Readings from Last 3 Encounters:  08/09/21 224 lb 3.3 oz (101.7 kg)  07/30/21 246 lb 14.6 oz (112 kg)  06/30/21 239 lb 12.8 oz (108.8  kg)      Other studies Reviewed: Additional studies/ records that were reviewed today include: ***. Review of the above records demonstrates:  Please see elsewhere in the note.  ***   ASSESSMENT AND PLAN:  ***   Current medicines are reviewed at length with the patient today.  The patient {ACTIONS; HAS/DOES NOT HAVE:19233} concerns regarding medicines.  The following changes have been made:  {PLAN; NO CHANGE:13088:s}  Labs/ tests ordered today include: *** No orders of the defined types were placed in this encounter.    Disposition:   FU with ***    Signed, Minus Breeding, MD  08/09/2021 9:01 PM    [facility]

## 2021-08-09 NOTE — Progress Notes (Signed)
PROGRESS NOTE        PATIENT DETAILS Name: William Flores Age: 71 y.o. Sex: male Date of Birth: 1950/01/08 Admit Date: 08/04/2021 Admitting Physician Clance Boll, MD [id number], [name], MD  Brief Summary: Patient is a 70 y.o.  male with a history of ESRD on HD TTS, chronic hypoxic respiratory failure on home O2, pulmonary hypertension, Nash liver cirrhosis, DM-2 who presented with epigastric discomfort-found to have non-STEMI and subsequently admitted to the hospitalist service.  See below for further details.  Significant events: 8/2>> admit to [facility]-epigastric pain-non-STEMI. 8/4>> discussed with patient-DNR-agrees to pursue gentle medical treatment.  No LHC planned.  Significant studies: 8/2>> CXR:  Pulmonary edema.  Significant microbiology data: 8/2>> urine culture: ESBL E. Coli (colonization and not acute infection)  Procedures:   Consults: Cardiology, nephrology.Pall care  Subjective: No chest pain or shortness of breath.  Lying comfortably in bed.  Objective: Vitals: Blood pressure 93/60, pulse (!) 107, temperature 97.7 F (36.5 C), temperature source Oral, resp. rate 20, height 5' 4"  (1.626 m), weight 101.7 kg, SpO2 92 %.   Exam: Gen Exam:Alert awake-not in any distress HEENT:atraumatic, normocephalic Chest: B/L clear to auscultation anteriorly CVS:S1S2 regular Abdomen:soft non tender, non distended Extremities:no edema Neurology: Non focal Skin: no rash   Pertinent Labs/Radiology:    Latest Ref Rng & Units 08/07/2021   11:14 AM 08/07/2021    2:19 AM 08/06/2021    4:34 AM  CBC  WBC 4.0 - 10.5 K/uL 8.5  8.7  5.8   Hemoglobin 13.0 - 17.0 g/dL 11.1  11.5  10.7   Hematocrit 39.0 - 52.0 % 34.4  34.8  33.1   Platelets 150 - 400 K/uL 109  113  81     Lab Results  Component Value Date   NA 133 (L) 08/07/2021   K 4.1 08/07/2021   CL 96 (L) 08/07/2021   CO2 26 08/07/2021      Assessment/Plan:  Hepatic encephalopathy,  new onset secondary to noncompliance  Continue rifaximin/lactulose - has not taken/refused dosing over the past 24+ hours. If he cannot take PO today will either resort to enema or place NG tube for administration.  Non-STEMI, ACS ruled out:  No chest pain-extensive discussion with patient this morning-plans are to continue gentle medical treatment without pursuing LHC and other invasive procedures.  Continue aspirin/statin - heparin drip discontinued  ESRD on HD TTS: Nephrology following-we will defer further to nephrology.  Fluid overload due to ESRD/acute on chronic RV failure/acute on chronic HFpEF: Volume removal with HD as above.    Chronic atrial fibrillation:  Rate controlled-Per prior notes-anticoagulation was discontinued in the setting of recurrent GI bleeding.  Given overall poor prognosis-history of cirrhosis/thrombocytopenia, and prior GI bleeding certainly reasonable to monitor off anticoagulation.  NASH/cirrhosis: Relatively well compensated-follow closely.  Chronic hypoxic respiratory failure on home O2-2-3 L/min: Continue O2 at home regimen.  Questionable adrenal insufficiency: Appears to be on Solu-Cortef since his last hospitalization at [facility]-this will be continued.  Attending MD at SNF to taper off steroids at his/her discretion.  Asymptomatic bacteriuria: No indication of UTI-stop Rocephin-status post 1 dose of fosfomycin as patient is immunocompromised.  Hypoglycemia: Likely due to being off steroids/n.p.o. status-has resolved after initiation of usual steroid dosing-continue to follow.   Recent Labs    08/08/21 1559 08/08/21 2259 08/09/21 0630  GLUCAP 108* 91  98     Gerd/Constipation Increase PRN miralax and lactulose - follow clinically  Chronic hypotension: BP stable-continue midodrine.  Hypothyroidism: Continue Synthroid  Gout: No flare-continue allopurinol.  GERD: Continue PPI  Palliative care: Extensive discussion during this hospitalization-poor  overall prognosis-not a candidate for aggressive care-previously followed by hospice at [facility] apparently was revoked several weeks back.  After extensive discussion-DNR reinstated-patient aware he is not a candidate for aggressive care-plans are to reinitiate hospice follow-up at SNF.  Palliative care following.  Morbid Obesity: Estimated body mass index is 38.49 kg/m as calculated from the following:   Height as of this encounter: 5' 4"  (1.626 m).   Weight as of this encounter: 101.7 kg.   Code status:   Code Status: DNR   DVT Prophylaxis: heparin injection 5,000 Units Start: 08/07/21 1430 Family Communication: Daughter-[name]-[phone]-updated over the phone on 8/5   Disposition Plan: Status is: Inpatient Remains inpatient appropriate because: Non-STEMI-see above.   Planned Discharge Destination:Skilled nursing facility likely early next week on 8/7.   Diet: Diet Order             Diet renal with fluid restriction Fluid restriction: 1200 mL Fluid; Room service appropriate? Yes; Fluid consistency: Thin  Diet effective now                     Antimicrobial agents: Anti-infectives (From admission, onward)    Start     Dose/Rate Route Frequency Ordered Stop   08/06/21 1015  fosfomycin (MONUROL) packet 3 g        3 g Oral  Once 08/06/21 0922 08/06/21 1159   08/05/21 1800  cefTRIAXone (ROCEPHIN) 1 g in sodium chloride 0.9 % 100 mL IVPB  Status:  Discontinued        1 g 200 mL/hr over 30 Minutes Intravenous Every 24 hours 08/04/21 2314 08/06/21 0922   08/05/21 1159  rifaximin (XIFAXAN) tablet 550 mg        550 mg Oral Every 12 hours 08/05/21 1017     08/04/21 1815  cefTRIAXone (ROCEPHIN) 1 g in sodium chloride 0.9 % 100 mL IVPB        1 g 200 mL/hr over 30 Minutes Intravenous  Once 08/04/21 1808 08/04/21 1931        MEDICATIONS: Scheduled Meds:  allopurinol  200 mg Oral Daily   aspirin  81 mg Oral Daily   atorvastatin  80 mg Oral Daily    Chlorhexidine Gluconate Cloth  6 each Topical Q0600   cholecalciferol  5,000 Units Oral Daily   ferrous sulfate  325 mg Oral Daily   heparin injection (subcutaneous)  5,000 Units Subcutaneous Q8H   hydrocortisone  10 mg Oral BID   lactulose  30 g Oral BID   levothyroxine  100 mcg Oral Q0600   midodrine  10 mg Oral TID WC   pantoprazole  40 mg Oral BID   polyethylene glycol  17 g Oral Daily   rifaximin  550 mg Oral Q12H   Continuous Infusions:   PRN Meds:.acetaminophen, dextrose, LORazepam, nitroGLYCERIN, ondansetron (ZOFRAN) IV, oxyCODONE, simethicone   I have personally reviewed following labs and imaging studies  LABORATORY DATA: CBC: Recent Labs  Lab 08/04/21 1455 08/05/21 0316 08/06/21 0434 08/07/21 0219 08/07/21 1114  WBC 5.6 5.5 5.8 8.7 8.5  NEUTROABS 3.8  --   --   --   --   HGB 11.3* 11.2* 10.7* 11.5* 11.1*  HCT 34.7* 34.8* 33.1* 34.8* 34.4*  MCV 99.4 99.7  99.4 98.6 99.7  PLT 83* 74* 81* 113* 109*     Basic Metabolic Panel: Recent Labs  Lab 08/03/21 0707 08/04/21 1455 08/04/21 2208 08/05/21 0829 08/05/21 1941 08/07/21 1113  NA 137 132*  --  132* 134* 133*  K 4.1 4.1  --  5.0 3.7 4.1  CL 100 98  --  97* 95* 96*  CO2  --  22  --  21* 28 26  GLUCOSE 101* 85  --  74 112* 107*  BUN 56* 40*  --  48* 22 41*  CREATININE 7.10* 5.29*  --  5.93* 3.73* 5.47*  CALCIUM  --  9.0  --  8.8* 8.6* 8.7*  MG  --   --  2.0  --   --   --   PHOS  --   --   --   --  3.1 3.9     GFR: Estimated Creatinine Clearance: 13.7 mL/min (A) (by C-G formula based on SCr of 5.47 mg/dL (H)).  Liver Function Tests: Recent Labs  Lab 08/04/21 1455 08/05/21 1941 08/07/21 1113  AST 27  --   --   ALT 15  --   --   ALKPHOS 69  --   --   BILITOT 1.1  --   --   PROT 6.2*  --   --   ALBUMIN 2.9* 2.7* 2.7*    Recent Labs  Lab 08/04/21 1455  LIPASE 18    No results for input(s): "AMMONIA" in the last 168 hours.  Coagulation Profile: Recent Labs  Lab 08/05/21 0316  INR  1.5*     Cardiac Enzymes: No results for input(s): "CKTOTAL", "CKMB", "CKMBINDEX", "TROPONINI" in the last 168 hours.  BNP (last 3 results) No results for input(s): "PROBNP" in the last 8760 hours.  Lipid Profile: No results for input(s): "CHOL", "HDL", "LDLCALC", "TRIG", "CHOLHDL", "LDLDIRECT" in the last 72 hours.   Thyroid Function Tests: No results for input(s): "TSH", "T4TOTAL", "FREET4", "T3FREE", "THYROIDAB" in the last 72 hours.   Anemia Panel: No results for input(s): "VITAMINB12", "FOLATE", "FERRITIN", "TIBC", "IRON", "RETICCTPCT" in the last 72 hours.  Urine analysis:    Component Value Date/Time   COLORURINE BROWN (A) 08/04/2021 1550   APPEARANCEUR TURBID (A) 08/04/2021 1550   LABSPEC 1.025 08/04/2021 1550   PHURINE 6.5 08/04/2021 1550   GLUCOSEU 100 (A) 08/04/2021 1550   HGBUR LARGE (A) 08/04/2021 1550   BILIRUBINUR MODERATE (A) 08/04/2021 1550   KETONESUR 15 (A) 08/04/2021 1550   PROTEINUR >300 (A) 08/04/2021 1550   UROBILINOGEN 0.2 05/16/2014 0822   NITRITE POSITIVE (A) 08/04/2021 1550   LEUKOCYTESUR LARGE (A) 08/04/2021 1550    Sepsis Labs: Lactic Acid, Venous    Component Value Date/Time   LATICACIDVEN 1.7 05/04/2021 1228    MICROBIOLOGY: Recent Results (from the past 240 hour(s))  Urine Culture     Status: Abnormal   Collection Time: 08/04/21  3:50 PM   Specimen: Urine, Catheterized  Result Value Ref Range Status   Specimen Description   Final    URINE, CATHETERIZED Performed at [facility], [address], [place], [place] [address]    Special Requests   Final    NONE Performed at [facility], [address] [place], [place] [address]    Culture (A)  Final    >=100,000 COLONIES/mL ESCHERICHIA COLI Confirmed Extended Spectrum Beta-Lactamase Producer (ESBL).  In bloodstream infections from ESBL organisms, carbapenems are preferred over piperacillin/tazobactam. They are shown to have a lower risk of  mortality.    Report Status 08/06/2021  FINAL  Final   Organism ID, Bacteria ESCHERICHIA COLI (A)  Final      Susceptibility   Escherichia coli - MIC*    AMPICILLIN >=32 RESISTANT Resistant     CEFAZOLIN >=64 RESISTANT Resistant     CEFEPIME 16 RESISTANT Resistant     CEFTRIAXONE >=64 RESISTANT Resistant     CIPROFLOXACIN >=4 RESISTANT Resistant     GENTAMICIN >=16 RESISTANT Resistant     IMIPENEM <=0.25 SENSITIVE Sensitive     NITROFURANTOIN 128 RESISTANT Resistant     TRIMETH/SULFA >=320 RESISTANT Resistant     AMPICILLIN/SULBACTAM 16 INTERMEDIATE Intermediate     PIP/TAZO 16 SENSITIVE Sensitive     * >=100,000 COLONIES/mL ESCHERICHIA COLI    RADIOLOGY STUDIES/RESULTS: No results found.   LOS: 5 days   Little Ishikawa, DO  [facility]    To contact the attending provider between 7A-7P or the covering provider during after hours 7P-7A, please log into the web site [email or web address] and access using universal [place] password for that web site. If you do not have the password, please call the hospital operator.  08/09/2021, 10:17 AM

## 2021-08-10 DIAGNOSIS — I214 Non-ST elevation (NSTEMI) myocardial infarction: Secondary | ICD-10-CM | POA: Diagnosis not present

## 2021-08-10 LAB — COMPREHENSIVE METABOLIC PANEL
ALT: 9 U/L (ref 0–44)
AST: 26 U/L (ref 15–41)
Albumin: 2.8 g/dL — ABNORMAL LOW (ref 3.5–5.0)
Alkaline Phosphatase: 63 U/L (ref 38–126)
Anion gap: 14 (ref 5–15)
BUN: 47 mg/dL — ABNORMAL HIGH (ref 8–23)
CO2: 24 mmol/L (ref 22–32)
Calcium: 8.7 mg/dL — ABNORMAL LOW (ref 8.9–10.3)
Chloride: 95 mmol/L — ABNORMAL LOW (ref 98–111)
Creatinine, Ser: 6.24 mg/dL — ABNORMAL HIGH (ref 0.61–1.24)
GFR, Estimated: 9 mL/min — ABNORMAL LOW (ref >60)
Glucose, Bld: 125 mg/dL — ABNORMAL HIGH (ref 70–99)
Potassium: 3.6 mmol/L (ref 3.5–5.1)
Sodium: 133 mmol/L — ABNORMAL LOW (ref 135–145)
Total Bilirubin: 1.1 mg/dL (ref 0.3–1.2)
Total Protein: 5.8 g/dL — ABNORMAL LOW (ref 6.5–8.1)

## 2021-08-10 LAB — CBC
HCT: 31.4 % — ABNORMAL LOW (ref 39.0–52.0)
Hemoglobin: 9.9 g/dL — ABNORMAL LOW (ref 13.0–17.0)
MCH: 32.2 pg (ref 26.0–34.0)
MCHC: 31.5 g/dL (ref 30.0–36.0)
MCV: 102.3 fL — ABNORMAL HIGH (ref 80.0–100.0)
Platelets: 64 10*3/uL — ABNORMAL LOW (ref 150–400)
RBC: 3.07 MIL/uL — ABNORMAL LOW (ref 4.22–5.81)
RDW: 20.4 % — ABNORMAL HIGH (ref 11.5–15.5)
WBC: 6.4 10*3/uL (ref 4.0–10.5)
nRBC: 0.9 % — ABNORMAL HIGH (ref 0.0–0.2)

## 2021-08-10 LAB — GLUCOSE, CAPILLARY
Glucose-Capillary: 86 mg/dL (ref 70–99)
Glucose-Capillary: 85 mg/dL (ref 70–99)

## 2021-08-10 MED ORDER — ATORVASTATIN CALCIUM 80 MG PO TABS: 80.0000 mg | ORAL_TABLET | Freq: Every day | ORAL | 2 refills | Status: DC

## 2021-08-10 MED ORDER — LACTULOSE 10 GM/15ML PO SOLN: 30.0000 g | Freq: Two times a day (BID) | ORAL | 2 refills | Status: DC

## 2021-08-10 MED ORDER — HEPARIN SODIUM (PORCINE) 1000 UNIT/ML IJ SOLN
INTRAMUSCULAR | Status: AC
  Filled 2021-08-10: qty 4

## 2021-08-10 MED ORDER — ALBUMIN HUMAN 25 % IV SOLN
25.0000 g | Freq: Once | INTRAVENOUS | Status: DC
Start: 2021-08-10 — End: 2021-08-10

## 2021-08-10 MED ORDER — ASPIRIN 81 MG PO CHEW: 81.0000 mg | CHEWABLE_TABLET | Freq: Every day | ORAL | 2 refills | Status: DC

## 2021-08-10 NOTE — Progress Notes (Addendum)
Attempted report to [facility], no answer. Will try again as able.  1401: Report called to Lonn Georgia, nurse at [facility].

## 2021-08-10 NOTE — Discharge Summary (Addendum)
Physician Discharge Summary  PSALM SCHAPPELL NWG:[id number] DOB: 1950-09-03 DOA: 08/04/2021  PCP: [name], [name], MD  Admit date: 08/04/2021 Discharge date: 08/10/2021  Admitted From: Facility Disposition: Same  Recommendations for Outpatient Follow-up:  Follow up with PCP in 1-2 weeks Follow-up with nephrology and continue dialysis as scheduled  Discharge Condition: Stable CODE STATUS: DNR Diet recommendation: Renal diet as tolerated  Brief/Interim Summary: Patient is a 71 y.o.  male with a history of ESRD on HD TTS, chronic hypoxic respiratory failure on home O2, pulmonary hypertension, Nash liver cirrhosis, DM-2 who presented with epigastric discomfort-found to have non-STEMI and subsequently admitted to the hospitalist service.  Patient was treated with noninvasive medical management given his wishes and chronic comorbid conditions.  At this time he continues to tolerate dialysis well, chest pain has resolved continues on aspirin and high intensity statin per cardiology.  Patient's urine was noted to be colonized with ESBL, no acute infection or indication for treatment.  Patient's mental status has been somewhat fluctuating in the setting of lactulose dosing, having previously been refused and now increased patient is much more awake alert and oriented with minimal loose stool which will improve with lactulose titration.  Patient otherwise stable and agreeable for discharge back to facility.  We discussed palliative care and hospice while in house he understands his ongoing morbidity mortality are elevated given his history and will follow along but has not yet decided on hospice and wishes to continue medical management at this time.  Patient otherwise stable and agreeable for discharge back to facility for ongoing care with new medications as outlined below including aspirin, increased dose of atorvastatin.  Close follow-up with PCP nephrology ongoing in the outpatient setting.      Significant microbiology data: 8/2>> urine culture: ESBL E. Coli (colonization and not acute infection)  Exam: Gen Exam:Alert awake-not in any distress HEENT:atraumatic, normocephalic Chest: B/L clear to auscultation anteriorly CVS:S1S2 regular Abdomen:soft non tender, moderately distended with notable fluid wave Extremities:no edema Neurology: Non focal Skin: no rash   Discharge Diagnoses:  Principal Problem:   NSTEMI (non-ST elevated myocardial infarction) [facility])  Discharge Instructions   Allergies as of 08/10/2021       Reactions   Codeine    Headache   Diclofenac    Ibuprofen    Indomethacin    Meloxicam    Naproxen         Medication List     STOP taking these medications    Allopurinol 200 MG Tabs   ciprofloxacin 500 MG tablet Commonly known as: CIPRO   Enulose 10 GM/15ML Soln Generic drug: lactulose (encephalopathy)   ferrous sulfate 325 (65 FE) MG tablet   insulin detemir 100 UNIT/ML injection Commonly known as: LEVEMIR   morphine CONCENTRATE 10 mg / 0.5 ml concentrated solution   oxyCODONE-acetaminophen 5-325 MG tablet Commonly known as: Percocet       TAKE these medications    acetaminophen 160 MG/5ML liquid Commonly known as: TYLENOL Take 650 mg by mouth every 4 (four) hours as needed for fever.   ammonium lactate 12 % cream Commonly known as: AMLACTIN Apply 1 Application topically 2 (two) times daily. To both feet   aspirin 81 MG chewable tablet Chew 1 tablet (81 mg total) by mouth daily.   atorvastatin 80 MG tablet Commonly known as: LIPITOR Take 1 tablet (80 mg total) by mouth daily. What changed:  medication strength how much to take how to take this when to take this additional instructions  B-complex with vitamin C tablet Take 1 tablet by mouth daily.   guaiFENesin 100 MG/5ML liquid Commonly known as: ROBITUSSIN Take 100 mg by mouth every 4 (four) hours as needed for cough.   hydrocortisone 10 MG  tablet Commonly known as: CORTEF Take 1 tablet (10 mg total) by mouth 2 (two) times daily. What changed: when to take this   insulin aspart 100 UNIT/ML injection Commonly known as: novoLOG Before each meal 3 times a day, 140-199 - 2 units, 200-250 - 4 units, 251-299 - 6 units,  300-349 - 8 units,  350 or above 10 units. Insulin PEN if approved, provide syringes and needles if needed. What changed:  how much to take how to take this when to take this additional instructions   lactulose 10 GM/15ML solution Commonly known as: CHRONULAC Take 45 mLs (30 g total) by mouth 2 (two) times daily.   levothyroxine 100 MCG tablet Commonly known as: SYNTHROID Take 100 mcg by mouth daily.   melatonin 5 MG Tabs Take 5 mg by mouth at bedtime as needed (insomnia).   midodrine 10 MG tablet Commonly known as: PROAMATINE Take 1 tablet (10 mg total) by mouth 3 (three) times daily with meals. What changed: when to take this   pantoprazole 40 MG tablet Commonly known as: PROTONIX Take 1 tablet (40 mg total) by mouth 2 (two) times daily.   polyethylene glycol 17 g packet Commonly known as: MIRALAX / GLYCOLAX Take 17 g by mouth daily.   rifaximin 550 MG Tabs tablet Commonly known as: XIFAXAN Take 1 tablet (550 mg total) by mouth 2 (two) times daily. What changed: when to take this   sildenafil 20 MG tablet Commonly known as: REVATIO Take 1 tablet (20 mg total) by mouth 3 (three) times daily.   simethicone 125 MG chewable tablet Commonly known as: MYLICON Chew 712 mg by mouth every 8 (eight) hours as needed for flatulence.   traMADol 50 MG tablet Commonly known as: Ultram Take 1 tablet (50 mg total) by mouth every 8 (eight) hours as needed for severe pain.   Vitamin D 125 MCG (5000 UT) Caps Take 5,000 Units by mouth daily.        Allergies  Allergen Reactions   Codeine     Headache    Diclofenac    Ibuprofen    Indomethacin    Meloxicam    Naproxen      Consultations: Cardiology, nephrology.Pall care  Procedures/Studies: DG Chest Port 1 View  Result Date: 08/04/2021 CLINICAL DATA:  Abdominal pain, dialysis due tomorrow EXAM: PORTABLE CHEST 1 VIEW COMPARISON:  Radiographs 05/27/2021 FINDINGS: Cardiomegaly. Patchy bilateral airspace and interstitial opacities suggestive of edema. Left basilar atelectasis/consolidation. Possible small bilateral pleural effusions. Aortic calcification. Right IJ CVC tip in the right atrium. Partially visualized gaseous distention of the stomach. IMPRESSION: Slight interval worsening in bilateral airspace disease compatible with edema. Probable small bilateral pleural effusions. Electronically Signed   By: Placido Sou M.D.   On: 08/04/2021 18:28     Subjective: No acute issues or events overnight denies nausea vomiting constipation headache fevers chills or chest pain -rectal tube placed this morning due to ongoing diarrhea in the setting of increased lactulose   Discharge Exam: Vitals:   08/10/21 1158 08/10/21 1221  BP: (!) 98/58 (!) 113/96  Pulse: 61 (!) 112  Resp: (!) 22 20  Temp: 98.4 F (36.9 C) 98.3 F (36.8 C)  SpO2: 97% 97%   Vitals:   08/10/21 1145 08/10/21  1156 08/10/21 1158 08/10/21 1221  BP: (!) 87/76 (!) 98/58 (!) 98/58 (!) 113/96  Pulse: (!) 26 (!) 105 61 (!) 112  Resp: (!) 25 (!) 27 (!) 22 20  Temp:   98.4 F (36.9 C) 98.3 F (36.8 C)  TempSrc:   Oral Oral  SpO2: 92% 92% 97% 97%  Weight:   99.5 kg   Height:        The results of significant diagnostics from this hospitalization (including imaging, microbiology, ancillary and laboratory) are listed below for reference.     Microbiology: Recent Results (from the past 240 hour(s))  Urine Culture     Status: Abnormal   Collection Time: 08/04/21  3:50 PM   Specimen: Urine, Catheterized  Result Value Ref Range Status   Specimen Description   Final    URINE, CATHETERIZED Performed at [facility], [address]  [place], [place] [address]    Special Requests   Final    NONE Performed at [facility], [address], [place], [place] [address]    Culture (A)  Final    >=100,000 COLONIES/mL ESCHERICHIA COLI Confirmed Extended Spectrum Beta-Lactamase Producer (ESBL).  In bloodstream infections from ESBL organisms, carbapenems are preferred over piperacillin/tazobactam. They are shown to have a lower risk of mortality.    Report Status 08/06/2021 FINAL  Final   Organism ID, Bacteria ESCHERICHIA COLI (A)  Final      Susceptibility   Escherichia coli - MIC*    AMPICILLIN >=32 RESISTANT Resistant     CEFAZOLIN >=64 RESISTANT Resistant     CEFEPIME 16 RESISTANT Resistant     CEFTRIAXONE >=64 RESISTANT Resistant     CIPROFLOXACIN >=4 RESISTANT Resistant     GENTAMICIN >=16 RESISTANT Resistant     IMIPENEM <=0.25 SENSITIVE Sensitive     NITROFURANTOIN 128 RESISTANT Resistant     TRIMETH/SULFA >=320 RESISTANT Resistant     AMPICILLIN/SULBACTAM 16 INTERMEDIATE Intermediate     PIP/TAZO 16 SENSITIVE Sensitive     * >=100,000 COLONIES/mL ESCHERICHIA COLI     Labs: BNP (last 3 results) Recent Labs    12/01/20 0330 05/04/21 1228 08/04/21 2208  BNP 207.3* 1,371.0* 8,921.1*   Basic Metabolic Panel: Recent Labs  Lab 08/04/21 1455 08/04/21 2208 08/05/21 0829 08/05/21 1941 08/07/21 1113 08/10/21 0918  NA 132*  --  132* 134* 133* 133*  K 4.1  --  5.0 3.7 4.1 3.6  CL 98  --  97* 95* 96* 95*  CO2 22  --  21* 28 26 24   GLUCOSE 85  --  74 112* 107* 125*  BUN 40*  --  48* 22 41* 47*  CREATININE 5.29*  --  5.93* 3.73* 5.47* 6.24*  CALCIUM 9.0  --  8.8* 8.6* 8.7* 8.7*  MG  --  2.0  --   --   --   --   PHOS  --   --   --  3.1 3.9  --    Liver Function Tests: Recent Labs  Lab 08/04/21 1455 08/05/21 1941 08/07/21 1113 08/10/21 0918  AST 27  --   --  26  ALT 15  --   --  9  ALKPHOS 69  --   --  63  BILITOT 1.1  --   --  1.1  PROT 6.2*  --   --  5.8*  ALBUMIN 2.9* 2.7* 2.7* 2.8*   Recent Labs   Lab 08/04/21 1455  LIPASE 18   No results for input(s): "AMMONIA"  in the last 168 hours. CBC: Recent Labs  Lab 08/04/21 1455 08/05/21 0316 08/06/21 0434 08/07/21 0219 08/07/21 1114 08/10/21 0918  WBC 5.6 5.5 5.8 8.7 8.5 6.4  NEUTROABS 3.8  --   --   --   --   --   HGB 11.3* 11.2* 10.7* 11.5* 11.1* 9.9*  HCT 34.7* 34.8* 33.1* 34.8* 34.4* 31.4*  MCV 99.4 99.7 99.4 98.6 99.7 102.3*  PLT 83* 74* 81* 113* 109* 64*   Cardiac Enzymes: No results for input(s): "CKTOTAL", "CKMB", "CKMBINDEX", "TROPONINI" in the last 168 hours. BNP: Invalid input(s): "POCBNP" CBG: Recent Labs  Lab 08/08/21 2259 08/09/21 0630 08/09/21 1106 08/09/21 1624 08/10/21 1244  GLUCAP 91 98 81 86 86   D-Dimer No results for input(s): "DDIMER" in the last 72 hours. Hgb A1c No results for input(s): "HGBA1C" in the last 72 hours. Lipid Profile No results for input(s): "CHOL", "HDL", "LDLCALC", "TRIG", "CHOLHDL", "LDLDIRECT" in the last 72 hours. Thyroid function studies No results for input(s): "TSH", "T4TOTAL", "T3FREE", "THYROIDAB" in the last 72 hours.  Invalid input(s): "FREET3" Anemia work up No results for input(s): "VITAMINB12", "FOLATE", "FERRITIN", "TIBC", "IRON", "RETICCTPCT" in the last 72 hours. Urinalysis    Component Value Date/Time   COLORURINE BROWN (A) 08/04/2021 1550   APPEARANCEUR TURBID (A) 08/04/2021 1550   LABSPEC 1.025 08/04/2021 1550   PHURINE 6.5 08/04/2021 1550   GLUCOSEU 100 (A) 08/04/2021 1550   HGBUR LARGE (A) 08/04/2021 1550   BILIRUBINUR MODERATE (A) 08/04/2021 1550   KETONESUR 15 (A) 08/04/2021 1550   PROTEINUR >300 (A) 08/04/2021 1550   UROBILINOGEN 0.2 05/16/2014 0822   NITRITE POSITIVE (A) 08/04/2021 1550   LEUKOCYTESUR LARGE (A) 08/04/2021 1550   Sepsis Labs Recent Labs  Lab 08/06/21 0434 08/07/21 0219 08/07/21 1114 08/10/21 0918  WBC 5.8 8.7 8.5 6.4   Microbiology Recent Results (from the past 240 hour(s))  Urine Culture     Status: Abnormal    Collection Time: 08/04/21  3:50 PM   Specimen: Urine, Catheterized  Result Value Ref Range Status   Specimen Description   Final    URINE, CATHETERIZED Performed at [facility], [address], [place], [place] [address]    Special Requests   Final    NONE Performed at [facility], [address], [place], [place] [address]    Culture (A)  Final    >=100,000 COLONIES/mL ESCHERICHIA COLI Confirmed Extended Spectrum Beta-Lactamase Producer (ESBL).  In bloodstream infections from ESBL organisms, carbapenems are preferred over piperacillin/tazobactam. They are shown to have a lower risk of mortality.    Report Status 08/06/2021 FINAL  Final   Organism ID, Bacteria ESCHERICHIA COLI (A)  Final      Susceptibility   Escherichia coli - MIC*    AMPICILLIN >=32 RESISTANT Resistant     CEFAZOLIN >=64 RESISTANT Resistant     CEFEPIME 16 RESISTANT Resistant     CEFTRIAXONE >=64 RESISTANT Resistant     CIPROFLOXACIN >=4 RESISTANT Resistant     GENTAMICIN >=16 RESISTANT Resistant     IMIPENEM <=0.25 SENSITIVE Sensitive     NITROFURANTOIN 128 RESISTANT Resistant     TRIMETH/SULFA >=320 RESISTANT Resistant     AMPICILLIN/SULBACTAM 16 INTERMEDIATE Intermediate     PIP/TAZO 16 SENSITIVE Sensitive     * >=100,000 COLONIES/mL ESCHERICHIA COLI     Time coordinating discharge: Over 30 minutes  SIGNED:   Little Ishikawa, DO [facility] 08/10/2021, 2:08 PM Pager   If 7PM-7AM, please contact night-coverage [email or web address]

## 2021-08-10 NOTE — Progress Notes (Signed)
OT Cancellation Note  Patient Details Name: William Flores MRN: [id number] DOB: August 01, 1950   Cancelled Treatment:    Reason Eval/Treat Not Completed: Other (comment) (Will defer eval secondary to discharge back to SNF today) OT signing off at this time  Maritta Kief OTR/L 08/10/2021, 4:17 PM

## 2021-08-10 NOTE — Progress Notes (Signed)
Pt to d/c to snf today. Contacted [employer] [employer] and spoke to [place]. Clinic aware pt will d/c today and resume care on Thursday. Clinic also advised pt is now a DNR. D/C summary and last renal note faxed to clinic for continuation of care.   Melven Sartorius Renal Navigator [phone]

## 2021-08-10 NOTE — Progress Notes (Signed)
[facility] NEPHROLOGY PROGRESS NOTE  Assessment/ Plan: Pt is a 71 y.o. yo male  with PMH significant for hypertension, DM, OSA, A-fib, NASH liver cirrhosis, thrombocytopenia, GI bleed, severe pulmonary hypertension with RV failure, chronic hypoxic respiratory failure on home oxygen, colon cancer status post resection, CAD, ESRD on HD TTS at [facility] who was transferred from [facility] for NSTEMI and possible cardiac cath, seen as a consultation for the management of dialysis.  Dialysis Orders from previous admission:  [facility]  4h   108kg    400/500   2/2.5 bath Hep none   RIJ TDC/  RUE AVG maturing - mircera 26mg q2weeks   Assessment/ Plan #NSTEMI/chest pain: Seen by cardiologist.  Noted patient is now DNR and plan for SNF then hospice care.  No procedures planned by cardiology.   # ESRD: TTS at [facility] RIJ TDC for access.  Next HD tomorrow.     #Dialysis Access: Using right IJ TDC.  He had right upper extremity AV graft placed by Dr. EDonnetta Hutchingon 8/1.  Healing good.   #Volume/blood pressure: Blood pressures on the low side, 90s today.  On midodrine for hypotension.   # Anemia of ESRD: Hemoglobin at goal.  No need for ESA.   # Metabolic Bone Disease: Monitor calcium and phosphorus level.  #Chronic A-fib: Fluctuation in heart rate.  No anticoagulation because of GI bleed and thrombocytopenia.  #Hepatic encephalopathy: Rifaximin/lactulose per primary   # Deconditioning - significant, per PT/ pmd  OLynnda ChildPA-C [facility] 08/10/2021,9:08 AM     Subjective: Seen in HD unit. Sleepy, no specific complaints.    Objective Vital signs in last 24 hours: Vitals:   08/10/21 0811 08/10/21 0821 08/10/21 0829 08/10/21 0900  BP: (!) 80/70 (!) 86/50 91/77 (!) 90/45  Pulse: (!) 110 (!) 120 (!) 108 (!) 120  Resp: (!) 22 18 (!) 21 (!) 21  Temp: 98.5 F (36.9 C)     TempSrc:      SpO2: 94% 96% 100% 100%  Weight: 101 kg      Height:       Medications:  Scheduled Medications:  allopurinol  200 mg Oral Daily   aspirin  81 mg Oral Daily   atorvastatin  80 mg Oral Daily   Chlorhexidine Gluconate Cloth  6 each Topical Q0600   Chlorhexidine Gluconate Cloth  6 each Topical Q0600   cholecalciferol  5,000 Units Oral Daily   ferrous sulfate  325 mg Oral Daily   heparin injection (subcutaneous)  5,000 Units Subcutaneous Q8H   heparin sodium (porcine)       hydrocortisone  10 mg Oral BID   lactulose  30 g Oral TID   levothyroxine  100 mcg Oral Q0600   midodrine  10 mg Oral TID WC   pantoprazole  40 mg Oral BID   polyethylene glycol  17 g Oral Daily   rifaximin  550 mg Oral Q12H    have reviewed scheduled and prn medications.  Physical Exam: General:NAD, chron ill appearing older WM, deconditioned significantly Heart:RRR, s1s2 nl Lungs:clear b/l, no crackle Abdomen:soft, Non-tender, non-distended Extremities: 2+ bilat hip edema, no other edema Dialysis Access: Right IJ TDC in place.

## 2021-08-10 NOTE — TOC Transition Note (Signed)
Transition of Care [facility]) - CM/SW Discharge Note   Patient Details  Name: William Flores MRN: [id number] Date of Birth: May 08, 1950  Transition of Care [facility]) CM/SW Contact:  Bethann [place], [place] Phone Number: 08/10/2021, 12:58 PM   Clinical Narrative:     Patient will DC to: [facility] Anticipated DC date:08/10/21 Family notified: Daughter Therapist, art by: Corey Harold   Per MD patient ready for DC to [facility]. RN, patient, patient's family, and facility notified of DC. Discharge Summary and FL2 sent to facility. RN to call report prior to discharge [phone] Room 210B). DC packet on chart. Ambulance transport requested for patient.   CSW will sign off for now as social work intervention is no longer needed. Please consult [place] again if new needs arise.   Final next level of care: [place] Barriers to Discharge: No Barriers Identified   Patient Goals and CMS Choice        Discharge Placement              Patient chooses bed at: [facility] Patient to be transferred to facility by: [place] Name of family member notified: daughter [name] Patient and family notified of of transfer: 08/10/21  Discharge Plan and Services                                     Social Determinants of Health (SDOH) Interventions     Readmission Risk Interventions    05/05/2021   11:32 AM 04/23/2021    1:14 PM 11/23/2020   10:58 AM  Readmission Risk Prevention Plan  Transportation Screening Complete Complete Complete  Medication Review Press photographer) Complete Complete Complete  PCP or Specialist appointment within 3-5 days of discharge Complete  Complete  HRI or Home Care Consult Complete Complete Complete  SW Recovery Care/Counseling Consult Complete Complete Complete  Palliative Care Screening Not Applicable Not Applicable   Skilled Nursing Facility Complete Complete Complete

## 2021-08-10 NOTE — Progress Notes (Signed)
PT Cancellation Note  Patient Details Name: William Flores MRN: [id number] DOB: 11-29-1950   Cancelled Treatment:    Reason Eval/Treat Not Completed: Other (comment). Pt returning to [facility] where he is a long term care pt and pt has repeatedly refused PT intervention. Will sign off.    Shary Decamp [facility] 08/10/2021, 2:23 PM [place] Office [phone]

## 2021-08-10 NOTE — Progress Notes (Signed)
Pt transported to HD via bed by transporter.

## 2021-08-11 ENCOUNTER — Ambulatory Visit: Payer: Medicare Other | Admitting: Cardiology

## 2021-08-11 DIAGNOSIS — K7581 Nonalcoholic steatohepatitis (NASH): Secondary | ICD-10-CM | POA: Diagnosis not present

## 2021-08-11 DIAGNOSIS — D509 Iron deficiency anemia, unspecified: Secondary | ICD-10-CM | POA: Diagnosis not present

## 2021-08-11 DIAGNOSIS — I214 Non-ST elevation (NSTEMI) myocardial infarction: Secondary | ICD-10-CM | POA: Diagnosis not present

## 2021-08-11 DIAGNOSIS — E785 Hyperlipidemia, unspecified: Secondary | ICD-10-CM | POA: Diagnosis not present

## 2021-08-11 DIAGNOSIS — K7200 Acute and subacute hepatic failure without coma: Secondary | ICD-10-CM | POA: Diagnosis not present

## 2021-08-11 DIAGNOSIS — E039 Hypothyroidism, unspecified: Secondary | ICD-10-CM | POA: Diagnosis not present

## 2021-08-11 DIAGNOSIS — K7460 Unspecified cirrhosis of liver: Secondary | ICD-10-CM | POA: Diagnosis not present

## 2021-08-11 DIAGNOSIS — E119 Type 2 diabetes mellitus without complications: Secondary | ICD-10-CM | POA: Diagnosis not present

## 2021-08-11 DIAGNOSIS — Z992 Dependence on renal dialysis: Secondary | ICD-10-CM | POA: Diagnosis not present

## 2021-08-11 DIAGNOSIS — I50812 Chronic right heart failure: Secondary | ICD-10-CM | POA: Diagnosis not present

## 2021-08-11 DIAGNOSIS — Z87440 Personal history of urinary (tract) infections: Secondary | ICD-10-CM | POA: Diagnosis not present

## 2021-08-11 DIAGNOSIS — Z9981 Dependence on supplemental oxygen: Secondary | ICD-10-CM | POA: Diagnosis not present

## 2021-08-11 DIAGNOSIS — N186 End stage renal disease: Secondary | ICD-10-CM | POA: Diagnosis not present

## 2021-08-11 DIAGNOSIS — I4891 Unspecified atrial fibrillation: Secondary | ICD-10-CM | POA: Diagnosis not present

## 2021-08-11 DIAGNOSIS — I429 Cardiomyopathy, unspecified: Secondary | ICD-10-CM | POA: Diagnosis not present

## 2021-08-11 DIAGNOSIS — I5022 Chronic systolic (congestive) heart failure: Secondary | ICD-10-CM | POA: Diagnosis not present

## 2021-08-11 DIAGNOSIS — J9621 Acute and chronic respiratory failure with hypoxia: Secondary | ICD-10-CM | POA: Diagnosis not present

## 2021-08-11 DIAGNOSIS — J90 Pleural effusion, not elsewhere classified: Secondary | ICD-10-CM | POA: Diagnosis not present

## 2021-08-11 DIAGNOSIS — I959 Hypotension, unspecified: Secondary | ICD-10-CM | POA: Diagnosis not present

## 2021-08-12 DIAGNOSIS — N186 End stage renal disease: Secondary | ICD-10-CM | POA: Diagnosis not present

## 2021-08-12 DIAGNOSIS — D509 Iron deficiency anemia, unspecified: Secondary | ICD-10-CM | POA: Diagnosis not present

## 2021-08-12 DIAGNOSIS — I272 Pulmonary hypertension, unspecified: Secondary | ICD-10-CM | POA: Diagnosis not present

## 2021-08-12 DIAGNOSIS — I214 Non-ST elevation (NSTEMI) myocardial infarction: Secondary | ICD-10-CM | POA: Diagnosis not present

## 2021-08-12 DIAGNOSIS — Z87440 Personal history of urinary (tract) infections: Secondary | ICD-10-CM | POA: Diagnosis not present

## 2021-08-12 DIAGNOSIS — R1013 Epigastric pain: Secondary | ICD-10-CM | POA: Diagnosis not present

## 2021-08-12 DIAGNOSIS — J9621 Acute and chronic respiratory failure with hypoxia: Secondary | ICD-10-CM | POA: Diagnosis not present

## 2021-08-12 DIAGNOSIS — I5022 Chronic systolic (congestive) heart failure: Secondary | ICD-10-CM | POA: Diagnosis not present

## 2021-08-12 DIAGNOSIS — R079 Chest pain, unspecified: Secondary | ICD-10-CM | POA: Diagnosis not present

## 2021-08-12 DIAGNOSIS — N250 Renal osteodystrophy: Secondary | ICD-10-CM | POA: Diagnosis not present

## 2021-08-12 DIAGNOSIS — D631 Anemia in chronic kidney disease: Secondary | ICD-10-CM | POA: Diagnosis not present

## 2021-08-12 DIAGNOSIS — Z9981 Dependence on supplemental oxygen: Secondary | ICD-10-CM | POA: Diagnosis not present

## 2021-08-12 DIAGNOSIS — Z79899 Other long term (current) drug therapy: Secondary | ICD-10-CM | POA: Diagnosis not present

## 2021-08-12 DIAGNOSIS — Z992 Dependence on renal dialysis: Secondary | ICD-10-CM | POA: Diagnosis not present

## 2021-08-13 DIAGNOSIS — I5022 Chronic systolic (congestive) heart failure: Secondary | ICD-10-CM | POA: Diagnosis not present

## 2021-08-13 DIAGNOSIS — I214 Non-ST elevation (NSTEMI) myocardial infarction: Secondary | ICD-10-CM | POA: Diagnosis not present

## 2021-08-13 DIAGNOSIS — Z87440 Personal history of urinary (tract) infections: Secondary | ICD-10-CM | POA: Diagnosis not present

## 2021-08-13 DIAGNOSIS — Z9981 Dependence on supplemental oxygen: Secondary | ICD-10-CM | POA: Diagnosis not present

## 2021-08-13 DIAGNOSIS — J9621 Acute and chronic respiratory failure with hypoxia: Secondary | ICD-10-CM | POA: Diagnosis not present

## 2021-08-13 DIAGNOSIS — N186 End stage renal disease: Secondary | ICD-10-CM | POA: Diagnosis not present

## 2021-08-14 DIAGNOSIS — D509 Iron deficiency anemia, unspecified: Secondary | ICD-10-CM | POA: Diagnosis not present

## 2021-08-14 DIAGNOSIS — D631 Anemia in chronic kidney disease: Secondary | ICD-10-CM | POA: Diagnosis not present

## 2021-08-14 DIAGNOSIS — Z992 Dependence on renal dialysis: Secondary | ICD-10-CM | POA: Diagnosis not present

## 2021-08-14 DIAGNOSIS — N186 End stage renal disease: Secondary | ICD-10-CM | POA: Diagnosis not present

## 2021-08-14 DIAGNOSIS — N250 Renal osteodystrophy: Secondary | ICD-10-CM | POA: Diagnosis not present

## 2021-08-17 DIAGNOSIS — Z9981 Dependence on supplemental oxygen: Secondary | ICD-10-CM | POA: Diagnosis not present

## 2021-08-17 DIAGNOSIS — Z87440 Personal history of urinary (tract) infections: Secondary | ICD-10-CM | POA: Diagnosis not present

## 2021-08-17 DIAGNOSIS — I214 Non-ST elevation (NSTEMI) myocardial infarction: Secondary | ICD-10-CM | POA: Diagnosis not present

## 2021-08-17 DIAGNOSIS — D509 Iron deficiency anemia, unspecified: Secondary | ICD-10-CM | POA: Diagnosis not present

## 2021-08-17 DIAGNOSIS — D631 Anemia in chronic kidney disease: Secondary | ICD-10-CM | POA: Diagnosis not present

## 2021-08-17 DIAGNOSIS — N186 End stage renal disease: Secondary | ICD-10-CM | POA: Diagnosis not present

## 2021-08-17 DIAGNOSIS — J9621 Acute and chronic respiratory failure with hypoxia: Secondary | ICD-10-CM | POA: Diagnosis not present

## 2021-08-17 DIAGNOSIS — Z992 Dependence on renal dialysis: Secondary | ICD-10-CM | POA: Diagnosis not present

## 2021-08-17 DIAGNOSIS — I5022 Chronic systolic (congestive) heart failure: Secondary | ICD-10-CM | POA: Diagnosis not present

## 2021-08-17 DIAGNOSIS — N250 Renal osteodystrophy: Secondary | ICD-10-CM | POA: Diagnosis not present

## 2021-08-18 DIAGNOSIS — I214 Non-ST elevation (NSTEMI) myocardial infarction: Secondary | ICD-10-CM | POA: Diagnosis not present

## 2021-08-18 DIAGNOSIS — Z87440 Personal history of urinary (tract) infections: Secondary | ICD-10-CM | POA: Diagnosis not present

## 2021-08-18 DIAGNOSIS — I5022 Chronic systolic (congestive) heart failure: Secondary | ICD-10-CM | POA: Diagnosis not present

## 2021-08-18 DIAGNOSIS — N186 End stage renal disease: Secondary | ICD-10-CM | POA: Diagnosis not present

## 2021-08-18 DIAGNOSIS — J9621 Acute and chronic respiratory failure with hypoxia: Secondary | ICD-10-CM | POA: Diagnosis not present

## 2021-08-18 DIAGNOSIS — Z9981 Dependence on supplemental oxygen: Secondary | ICD-10-CM | POA: Diagnosis not present

## 2021-08-19 DIAGNOSIS — D509 Iron deficiency anemia, unspecified: Secondary | ICD-10-CM | POA: Diagnosis not present

## 2021-08-19 DIAGNOSIS — I5022 Chronic systolic (congestive) heart failure: Secondary | ICD-10-CM | POA: Diagnosis not present

## 2021-08-19 DIAGNOSIS — Z9981 Dependence on supplemental oxygen: Secondary | ICD-10-CM | POA: Diagnosis not present

## 2021-08-19 DIAGNOSIS — Z87440 Personal history of urinary (tract) infections: Secondary | ICD-10-CM | POA: Diagnosis not present

## 2021-08-19 DIAGNOSIS — I214 Non-ST elevation (NSTEMI) myocardial infarction: Secondary | ICD-10-CM | POA: Diagnosis not present

## 2021-08-19 DIAGNOSIS — N250 Renal osteodystrophy: Secondary | ICD-10-CM | POA: Diagnosis not present

## 2021-08-19 DIAGNOSIS — D631 Anemia in chronic kidney disease: Secondary | ICD-10-CM | POA: Diagnosis not present

## 2021-08-19 DIAGNOSIS — J9621 Acute and chronic respiratory failure with hypoxia: Secondary | ICD-10-CM | POA: Diagnosis not present

## 2021-08-19 DIAGNOSIS — N186 End stage renal disease: Secondary | ICD-10-CM | POA: Diagnosis not present

## 2021-08-19 DIAGNOSIS — Z992 Dependence on renal dialysis: Secondary | ICD-10-CM | POA: Diagnosis not present

## 2021-08-20 DIAGNOSIS — J9621 Acute and chronic respiratory failure with hypoxia: Secondary | ICD-10-CM | POA: Diagnosis not present

## 2021-08-20 DIAGNOSIS — Z9981 Dependence on supplemental oxygen: Secondary | ICD-10-CM | POA: Diagnosis not present

## 2021-08-20 DIAGNOSIS — I214 Non-ST elevation (NSTEMI) myocardial infarction: Secondary | ICD-10-CM | POA: Diagnosis not present

## 2021-08-20 DIAGNOSIS — I5022 Chronic systolic (congestive) heart failure: Secondary | ICD-10-CM | POA: Diagnosis not present

## 2021-08-20 DIAGNOSIS — N186 End stage renal disease: Secondary | ICD-10-CM | POA: Diagnosis not present

## 2021-08-20 DIAGNOSIS — Z87440 Personal history of urinary (tract) infections: Secondary | ICD-10-CM | POA: Diagnosis not present

## 2021-08-21 DIAGNOSIS — N186 End stage renal disease: Secondary | ICD-10-CM | POA: Diagnosis not present

## 2021-08-21 DIAGNOSIS — D509 Iron deficiency anemia, unspecified: Secondary | ICD-10-CM | POA: Diagnosis not present

## 2021-08-21 DIAGNOSIS — D631 Anemia in chronic kidney disease: Secondary | ICD-10-CM | POA: Diagnosis not present

## 2021-08-21 DIAGNOSIS — Z992 Dependence on renal dialysis: Secondary | ICD-10-CM | POA: Diagnosis not present

## 2021-08-21 DIAGNOSIS — N250 Renal osteodystrophy: Secondary | ICD-10-CM | POA: Diagnosis not present

## 2021-08-24 DIAGNOSIS — D631 Anemia in chronic kidney disease: Secondary | ICD-10-CM | POA: Diagnosis not present

## 2021-08-24 DIAGNOSIS — Z992 Dependence on renal dialysis: Secondary | ICD-10-CM | POA: Diagnosis not present

## 2021-08-24 DIAGNOSIS — I214 Non-ST elevation (NSTEMI) myocardial infarction: Secondary | ICD-10-CM | POA: Diagnosis not present

## 2021-08-24 DIAGNOSIS — D509 Iron deficiency anemia, unspecified: Secondary | ICD-10-CM | POA: Diagnosis not present

## 2021-08-24 DIAGNOSIS — N250 Renal osteodystrophy: Secondary | ICD-10-CM | POA: Diagnosis not present

## 2021-08-24 DIAGNOSIS — Z9981 Dependence on supplemental oxygen: Secondary | ICD-10-CM | POA: Diagnosis not present

## 2021-08-24 DIAGNOSIS — J9621 Acute and chronic respiratory failure with hypoxia: Secondary | ICD-10-CM | POA: Diagnosis not present

## 2021-08-24 DIAGNOSIS — N186 End stage renal disease: Secondary | ICD-10-CM | POA: Diagnosis not present

## 2021-08-24 DIAGNOSIS — Z87440 Personal history of urinary (tract) infections: Secondary | ICD-10-CM | POA: Diagnosis not present

## 2021-08-24 DIAGNOSIS — I5022 Chronic systolic (congestive) heart failure: Secondary | ICD-10-CM | POA: Diagnosis not present

## 2021-08-25 DIAGNOSIS — Z9981 Dependence on supplemental oxygen: Secondary | ICD-10-CM | POA: Diagnosis not present

## 2021-08-25 DIAGNOSIS — J9621 Acute and chronic respiratory failure with hypoxia: Secondary | ICD-10-CM | POA: Diagnosis not present

## 2021-08-25 DIAGNOSIS — N186 End stage renal disease: Secondary | ICD-10-CM | POA: Diagnosis not present

## 2021-08-25 DIAGNOSIS — I214 Non-ST elevation (NSTEMI) myocardial infarction: Secondary | ICD-10-CM | POA: Diagnosis not present

## 2021-08-25 DIAGNOSIS — I5022 Chronic systolic (congestive) heart failure: Secondary | ICD-10-CM | POA: Diagnosis not present

## 2021-08-25 DIAGNOSIS — Z87440 Personal history of urinary (tract) infections: Secondary | ICD-10-CM | POA: Diagnosis not present

## 2021-08-26 DIAGNOSIS — I214 Non-ST elevation (NSTEMI) myocardial infarction: Secondary | ICD-10-CM | POA: Diagnosis not present

## 2021-08-26 DIAGNOSIS — N186 End stage renal disease: Secondary | ICD-10-CM | POA: Diagnosis not present

## 2021-08-26 DIAGNOSIS — J9621 Acute and chronic respiratory failure with hypoxia: Secondary | ICD-10-CM | POA: Diagnosis not present

## 2021-08-26 DIAGNOSIS — Z9981 Dependence on supplemental oxygen: Secondary | ICD-10-CM | POA: Diagnosis not present

## 2021-08-26 DIAGNOSIS — I5022 Chronic systolic (congestive) heart failure: Secondary | ICD-10-CM | POA: Diagnosis not present

## 2021-08-26 DIAGNOSIS — Z87440 Personal history of urinary (tract) infections: Secondary | ICD-10-CM | POA: Diagnosis not present

## 2021-08-27 DIAGNOSIS — J9621 Acute and chronic respiratory failure with hypoxia: Secondary | ICD-10-CM | POA: Diagnosis not present

## 2021-08-27 DIAGNOSIS — N186 End stage renal disease: Secondary | ICD-10-CM | POA: Diagnosis not present

## 2021-08-27 DIAGNOSIS — Z9981 Dependence on supplemental oxygen: Secondary | ICD-10-CM | POA: Diagnosis not present

## 2021-08-27 DIAGNOSIS — I5022 Chronic systolic (congestive) heart failure: Secondary | ICD-10-CM | POA: Diagnosis not present

## 2021-08-27 DIAGNOSIS — I214 Non-ST elevation (NSTEMI) myocardial infarction: Secondary | ICD-10-CM | POA: Diagnosis not present

## 2021-08-27 DIAGNOSIS — Z87440 Personal history of urinary (tract) infections: Secondary | ICD-10-CM | POA: Diagnosis not present

## 2021-08-28 DIAGNOSIS — D631 Anemia in chronic kidney disease: Secondary | ICD-10-CM | POA: Diagnosis not present

## 2021-08-28 DIAGNOSIS — N250 Renal osteodystrophy: Secondary | ICD-10-CM | POA: Diagnosis not present

## 2021-08-28 DIAGNOSIS — D509 Iron deficiency anemia, unspecified: Secondary | ICD-10-CM | POA: Diagnosis not present

## 2021-08-28 DIAGNOSIS — N186 End stage renal disease: Secondary | ICD-10-CM | POA: Diagnosis not present

## 2021-08-28 DIAGNOSIS — Z992 Dependence on renal dialysis: Secondary | ICD-10-CM | POA: Diagnosis not present

## 2021-08-30 DIAGNOSIS — I5022 Chronic systolic (congestive) heart failure: Secondary | ICD-10-CM | POA: Diagnosis not present

## 2021-08-30 DIAGNOSIS — Z87440 Personal history of urinary (tract) infections: Secondary | ICD-10-CM | POA: Diagnosis not present

## 2021-08-30 DIAGNOSIS — N186 End stage renal disease: Secondary | ICD-10-CM | POA: Diagnosis not present

## 2021-08-30 DIAGNOSIS — Z9981 Dependence on supplemental oxygen: Secondary | ICD-10-CM | POA: Diagnosis not present

## 2021-08-30 DIAGNOSIS — I214 Non-ST elevation (NSTEMI) myocardial infarction: Secondary | ICD-10-CM | POA: Diagnosis not present

## 2021-08-30 DIAGNOSIS — J9621 Acute and chronic respiratory failure with hypoxia: Secondary | ICD-10-CM | POA: Diagnosis not present

## 2021-08-31 DIAGNOSIS — Z87440 Personal history of urinary (tract) infections: Secondary | ICD-10-CM | POA: Diagnosis not present

## 2021-08-31 DIAGNOSIS — N186 End stage renal disease: Secondary | ICD-10-CM | POA: Diagnosis not present

## 2021-08-31 DIAGNOSIS — D509 Iron deficiency anemia, unspecified: Secondary | ICD-10-CM | POA: Diagnosis not present

## 2021-08-31 DIAGNOSIS — D631 Anemia in chronic kidney disease: Secondary | ICD-10-CM | POA: Diagnosis not present

## 2021-08-31 DIAGNOSIS — I5022 Chronic systolic (congestive) heart failure: Secondary | ICD-10-CM | POA: Diagnosis not present

## 2021-08-31 DIAGNOSIS — I214 Non-ST elevation (NSTEMI) myocardial infarction: Secondary | ICD-10-CM | POA: Diagnosis not present

## 2021-08-31 DIAGNOSIS — J9621 Acute and chronic respiratory failure with hypoxia: Secondary | ICD-10-CM | POA: Diagnosis not present

## 2021-08-31 DIAGNOSIS — N250 Renal osteodystrophy: Secondary | ICD-10-CM | POA: Diagnosis not present

## 2021-08-31 DIAGNOSIS — Z9981 Dependence on supplemental oxygen: Secondary | ICD-10-CM | POA: Diagnosis not present

## 2021-08-31 DIAGNOSIS — Z992 Dependence on renal dialysis: Secondary | ICD-10-CM | POA: Diagnosis not present

## 2021-09-01 ENCOUNTER — Ambulatory Visit (INDEPENDENT_AMBULATORY_CARE_PROVIDER_SITE_OTHER): Payer: Medicare Other | Admitting: Vascular Surgery

## 2021-09-01 ENCOUNTER — Encounter: Payer: Self-pay | Admitting: Vascular Surgery

## 2021-09-01 VITALS — BP 87/64 | HR 107 | Temp 98.4°F | Ht 64.0 in | Wt 219.0 lb

## 2021-09-01 DIAGNOSIS — I214 Non-ST elevation (NSTEMI) myocardial infarction: Secondary | ICD-10-CM | POA: Diagnosis not present

## 2021-09-01 DIAGNOSIS — Z9981 Dependence on supplemental oxygen: Secondary | ICD-10-CM | POA: Diagnosis not present

## 2021-09-01 DIAGNOSIS — Z992 Dependence on renal dialysis: Secondary | ICD-10-CM

## 2021-09-01 DIAGNOSIS — I5022 Chronic systolic (congestive) heart failure: Secondary | ICD-10-CM | POA: Diagnosis not present

## 2021-09-01 DIAGNOSIS — J9621 Acute and chronic respiratory failure with hypoxia: Secondary | ICD-10-CM | POA: Diagnosis not present

## 2021-09-01 DIAGNOSIS — Z87440 Personal history of urinary (tract) infections: Secondary | ICD-10-CM | POA: Diagnosis not present

## 2021-09-01 DIAGNOSIS — N186 End stage renal disease: Secondary | ICD-10-CM

## 2021-09-01 NOTE — Progress Notes (Signed)
[facility] [employer]  Patient name: William Flores MRN: [id number] DOB: 11-23-1950 Sex: male  REASON FOR VISIT: Follow-up right upper arm AV [place] placement at [facility] on 08/03/2021  HPI: William Flores is a 71 y.o. male here for follow-up.  He undergoes hemodialysis at [facility].  He has no difficulty with his tunneled hemodialysis catheter.  Following his new graft placement on 08/03/2020 he presented with abdominal pain to the emergency room on 08/04/2021 at [facility].  He had a localization through 08/10/2021.  Was felt to have a non-STEMI.  Has had overall clinical decline and also had palliative care consult.  He remains miserable overall.  Having chronic pain pretty much everywhere.  Has irritation on his buttocks and sacrum from sitting and lying continuously.  Current Outpatient Medications  Medication Sig Dispense Refill   acetaminophen (TYLENOL) 160 MG/5ML liquid Take 650 mg by mouth every 4 (four) hours as needed for fever.     ammonium lactate (AMLACTIN) 12 % cream Apply 1 Application topically 2 (two) times daily. To both feet     aspirin 81 MG chewable tablet Chew 1 tablet (81 mg total) by mouth daily. 30 tablet 2   atorvastatin (LIPITOR) 80 MG tablet Take 1 tablet (80 mg total) by mouth daily. 30 tablet 2   B Complex-C (B-COMPLEX WITH VITAMIN C) tablet Take 1 tablet by mouth daily.     Cholecalciferol (VITAMIN D) 125 MCG (5000 UT) CAPS Take 5,000 Units by mouth daily.     guaiFENesin (ROBITUSSIN) 100 MG/5ML liquid Take 100 mg by mouth every 4 (four) hours as needed for cough.     hydrocortisone (CORTEF) 10 MG tablet Take 1 tablet (10 mg total) by mouth 2 (two) times daily. (Patient taking differently: Take 10 mg by mouth every 12 (twelve) hours.) 60 tablet 2   insulin aspart (NOVOLOG) 100 UNIT/ML injection Before each meal 3 times a day, 140-199 - 2 units, 200-250 - 4 units, 251-299 - 6 units,  300-349 - 8  units,  350 or above 10 units. Insulin PEN if approved, provide syringes and needles if needed. (Patient taking differently: Inject 0-10 Units into the skin See admin instructions. Before each meal 3 times a day, CBG 140-199 : 2 units CBG 200-250 : 4 units CBG 251-299 : 6 units CBG 300-349 : 8 units CBG 350-399 : 10 units.) 10 mL 0   lactulose (CHRONULAC) 10 GM/15ML solution Take 45 mLs (30 g total) by mouth 2 (two) times daily. 2700 mL 2   levothyroxine (SYNTHROID) 100 MCG tablet Take 100 mcg by mouth daily.     melatonin 5 MG TABS Take 5 mg by mouth at bedtime as needed (insomnia).     midodrine (PROAMATINE) 10 MG tablet Take 1 tablet (10 mg total) by mouth 3 (three) times daily with meals. (Patient taking differently: Take 10 mg by mouth 3 (three) times daily.) 90 tablet 3   pantoprazole (PROTONIX) 40 MG tablet Take 1 tablet (40 mg total) by mouth 2 (two) times daily. 60 tablet 1   polyethylene glycol (MIRALAX / GLYCOLAX) 17 g packet Take 17 g by mouth daily. 14 each 0   rifaximin (XIFAXAN) 550 MG TABS tablet Take 1 tablet (550 mg total) by mouth 2 (two) times daily. (Patient taking differently: Take 550 mg by mouth every 12 (twelve) hours.) 60 tablet 1   sildenafil (REVATIO) 20 MG tablet Take 1 tablet (20 mg total) by mouth 3 (  three) times daily. 90 tablet 11   simethicone (MYLICON) 542 MG chewable tablet Chew 125 mg by mouth every 8 (eight) hours as needed for flatulence.     traMADol (ULTRAM) 50 MG tablet Take 1 tablet (50 mg total) by mouth every 8 (eight) hours as needed for severe pain. 15 tablet 0   No current facility-administered medications for this visit.   Facility-Administered Medications Ordered in Other Visits  Medication Dose Route Frequency Provider Last Rate Last Admin   0.9 %  sodium chloride infusion  100 mL Intravenous PRN Dwana Melena, MD       0.9 %  sodium chloride infusion  100 mL Intravenous PRN Dwana Melena, MD       alteplase (CATHFLO ACTIVASE) injection 2 mg  2  mg Intracatheter Once PRN Dwana Melena, MD       heparin injection 1,000 Units  1,000 Units Dialysis PRN Dwana Melena, MD       lidocaine (PF) (XYLOCAINE) 1 % injection 5 mL  5 mL Intradermal PRN Dwana Melena, MD       lidocaine-prilocaine (EMLA) cream 1 application.  1 application  Topical PRN Dwana Melena, MD       pentafluoroprop-tetrafluoroeth Landry Dyke) aerosol 1 application.  1 application  Topical PRN Dwana Melena, MD         PHYSICAL EXAM: Vitals:   09/01/21 0854  BP: (!) 87/64  Pulse: (!) 107  Temp: 98.4 F (36.9 C)  TempSrc: Temporal  SpO2: 96%  Weight: 219 lb (99.3 kg)  Height: 5' 4"  (1.626 m)    GENERAL: The patient is a well-nourished male, in no acute distress. The vital signs are documented above. I do not feel a thrill or hear a bruit in his right upper arm AV graft.  His incisions are well-healed  MEDICAL ISSUES: Acute occlusion of his AV graft placed on 08/03/2021.  I discussed this with the patient.  Explained it is somewhat unusual to have Averill Pons occlusion.  Could possibly be related to hypotensive episodes during his recent admission.  I would not recommend any attempts at thrombectomy or arm access.  He clearly is in declining health with a limited life expectancy.  I would recommend continued use of his catheter only.  As comfortable with this recommendation   [name], MD FACS [facility] [phone]  Note: Portions of this report may have been transcribed using voice recognition software.  Every effort has been made to ensure accuracy; however, inadvertent computerized transcription errors may still be present.

## 2021-09-02 DIAGNOSIS — D509 Iron deficiency anemia, unspecified: Secondary | ICD-10-CM | POA: Diagnosis not present

## 2021-09-02 DIAGNOSIS — D631 Anemia in chronic kidney disease: Secondary | ICD-10-CM | POA: Diagnosis not present

## 2021-09-02 DIAGNOSIS — N186 End stage renal disease: Secondary | ICD-10-CM | POA: Diagnosis not present

## 2021-09-02 DIAGNOSIS — Z992 Dependence on renal dialysis: Secondary | ICD-10-CM | POA: Diagnosis not present

## 2021-09-02 DIAGNOSIS — N250 Renal osteodystrophy: Secondary | ICD-10-CM | POA: Diagnosis not present

## 2021-09-02 DIAGNOSIS — I5022 Chronic systolic (congestive) heart failure: Secondary | ICD-10-CM | POA: Diagnosis not present

## 2021-09-02 DIAGNOSIS — J9621 Acute and chronic respiratory failure with hypoxia: Secondary | ICD-10-CM | POA: Diagnosis not present

## 2021-09-02 DIAGNOSIS — Z87440 Personal history of urinary (tract) infections: Secondary | ICD-10-CM | POA: Diagnosis not present

## 2021-09-02 DIAGNOSIS — I214 Non-ST elevation (NSTEMI) myocardial infarction: Secondary | ICD-10-CM | POA: Diagnosis not present

## 2021-09-02 DIAGNOSIS — Z9981 Dependence on supplemental oxygen: Secondary | ICD-10-CM | POA: Diagnosis not present

## 2021-09-03 DIAGNOSIS — I5022 Chronic systolic (congestive) heart failure: Secondary | ICD-10-CM | POA: Diagnosis not present

## 2021-09-03 DIAGNOSIS — D509 Iron deficiency anemia, unspecified: Secondary | ICD-10-CM | POA: Diagnosis not present

## 2021-09-03 DIAGNOSIS — N186 End stage renal disease: Secondary | ICD-10-CM | POA: Diagnosis not present

## 2021-09-03 DIAGNOSIS — J90 Pleural effusion, not elsewhere classified: Secondary | ICD-10-CM | POA: Diagnosis not present

## 2021-09-03 DIAGNOSIS — I214 Non-ST elevation (NSTEMI) myocardial infarction: Secondary | ICD-10-CM | POA: Diagnosis not present

## 2021-09-03 DIAGNOSIS — K7200 Acute and subacute hepatic failure without coma: Secondary | ICD-10-CM | POA: Diagnosis not present

## 2021-09-03 DIAGNOSIS — J9621 Acute and chronic respiratory failure with hypoxia: Secondary | ICD-10-CM | POA: Diagnosis not present

## 2021-09-03 DIAGNOSIS — Z9981 Dependence on supplemental oxygen: Secondary | ICD-10-CM | POA: Diagnosis not present

## 2021-09-03 DIAGNOSIS — I959 Hypotension, unspecified: Secondary | ICD-10-CM | POA: Diagnosis not present

## 2021-09-03 DIAGNOSIS — I50812 Chronic right heart failure: Secondary | ICD-10-CM | POA: Diagnosis not present

## 2021-09-03 DIAGNOSIS — Z87440 Personal history of urinary (tract) infections: Secondary | ICD-10-CM | POA: Diagnosis not present

## 2021-09-03 DIAGNOSIS — K7581 Nonalcoholic steatohepatitis (NASH): Secondary | ICD-10-CM | POA: Diagnosis not present

## 2021-09-03 DIAGNOSIS — I429 Cardiomyopathy, unspecified: Secondary | ICD-10-CM | POA: Diagnosis not present

## 2021-09-06 DIAGNOSIS — N186 End stage renal disease: Secondary | ICD-10-CM | POA: Diagnosis not present

## 2021-09-06 DIAGNOSIS — I5022 Chronic systolic (congestive) heart failure: Secondary | ICD-10-CM | POA: Diagnosis not present

## 2021-09-06 DIAGNOSIS — Z87440 Personal history of urinary (tract) infections: Secondary | ICD-10-CM | POA: Diagnosis not present

## 2021-09-06 DIAGNOSIS — I214 Non-ST elevation (NSTEMI) myocardial infarction: Secondary | ICD-10-CM | POA: Diagnosis not present

## 2021-09-06 DIAGNOSIS — J9621 Acute and chronic respiratory failure with hypoxia: Secondary | ICD-10-CM | POA: Diagnosis not present

## 2021-09-06 DIAGNOSIS — Z9981 Dependence on supplemental oxygen: Secondary | ICD-10-CM | POA: Diagnosis not present

## 2021-09-07 DIAGNOSIS — J9621 Acute and chronic respiratory failure with hypoxia: Secondary | ICD-10-CM | POA: Diagnosis not present

## 2021-09-07 DIAGNOSIS — N186 End stage renal disease: Secondary | ICD-10-CM | POA: Diagnosis not present

## 2021-09-07 DIAGNOSIS — Z87440 Personal history of urinary (tract) infections: Secondary | ICD-10-CM | POA: Diagnosis not present

## 2021-09-07 DIAGNOSIS — I214 Non-ST elevation (NSTEMI) myocardial infarction: Secondary | ICD-10-CM | POA: Diagnosis not present

## 2021-09-07 DIAGNOSIS — I5022 Chronic systolic (congestive) heart failure: Secondary | ICD-10-CM | POA: Diagnosis not present

## 2021-09-07 DIAGNOSIS — Z9981 Dependence on supplemental oxygen: Secondary | ICD-10-CM | POA: Diagnosis not present

## 2021-09-08 DIAGNOSIS — Z87440 Personal history of urinary (tract) infections: Secondary | ICD-10-CM | POA: Diagnosis not present

## 2021-09-08 DIAGNOSIS — J9621 Acute and chronic respiratory failure with hypoxia: Secondary | ICD-10-CM | POA: Diagnosis not present

## 2021-09-08 DIAGNOSIS — I214 Non-ST elevation (NSTEMI) myocardial infarction: Secondary | ICD-10-CM | POA: Diagnosis not present

## 2021-09-08 DIAGNOSIS — I5022 Chronic systolic (congestive) heart failure: Secondary | ICD-10-CM | POA: Diagnosis not present

## 2021-09-08 DIAGNOSIS — Z9981 Dependence on supplemental oxygen: Secondary | ICD-10-CM | POA: Diagnosis not present

## 2021-09-08 DIAGNOSIS — N186 End stage renal disease: Secondary | ICD-10-CM | POA: Diagnosis not present

## 2021-09-09 DIAGNOSIS — I5022 Chronic systolic (congestive) heart failure: Secondary | ICD-10-CM | POA: Diagnosis not present

## 2021-09-09 DIAGNOSIS — J9621 Acute and chronic respiratory failure with hypoxia: Secondary | ICD-10-CM | POA: Diagnosis not present

## 2021-09-09 DIAGNOSIS — N186 End stage renal disease: Secondary | ICD-10-CM | POA: Diagnosis not present

## 2021-09-09 DIAGNOSIS — I214 Non-ST elevation (NSTEMI) myocardial infarction: Secondary | ICD-10-CM | POA: Diagnosis not present

## 2021-09-09 DIAGNOSIS — Z9981 Dependence on supplemental oxygen: Secondary | ICD-10-CM | POA: Diagnosis not present

## 2021-09-09 DIAGNOSIS — Z87440 Personal history of urinary (tract) infections: Secondary | ICD-10-CM | POA: Diagnosis not present

## 2021-09-10 DIAGNOSIS — I5022 Chronic systolic (congestive) heart failure: Secondary | ICD-10-CM | POA: Diagnosis not present

## 2021-09-10 DIAGNOSIS — I214 Non-ST elevation (NSTEMI) myocardial infarction: Secondary | ICD-10-CM | POA: Diagnosis not present

## 2021-09-10 DIAGNOSIS — Z87440 Personal history of urinary (tract) infections: Secondary | ICD-10-CM | POA: Diagnosis not present

## 2021-09-10 DIAGNOSIS — N186 End stage renal disease: Secondary | ICD-10-CM | POA: Diagnosis not present

## 2021-09-10 DIAGNOSIS — Z9981 Dependence on supplemental oxygen: Secondary | ICD-10-CM | POA: Diagnosis not present

## 2021-09-10 DIAGNOSIS — J9621 Acute and chronic respiratory failure with hypoxia: Secondary | ICD-10-CM | POA: Diagnosis not present

## 2021-09-13 DIAGNOSIS — I5022 Chronic systolic (congestive) heart failure: Secondary | ICD-10-CM | POA: Diagnosis not present

## 2021-09-13 DIAGNOSIS — Z87440 Personal history of urinary (tract) infections: Secondary | ICD-10-CM | POA: Diagnosis not present

## 2021-09-13 DIAGNOSIS — N186 End stage renal disease: Secondary | ICD-10-CM | POA: Diagnosis not present

## 2021-09-13 DIAGNOSIS — Z9981 Dependence on supplemental oxygen: Secondary | ICD-10-CM | POA: Diagnosis not present

## 2021-09-13 DIAGNOSIS — I214 Non-ST elevation (NSTEMI) myocardial infarction: Secondary | ICD-10-CM | POA: Diagnosis not present

## 2021-09-13 DIAGNOSIS — J9621 Acute and chronic respiratory failure with hypoxia: Secondary | ICD-10-CM | POA: Diagnosis not present

## 2021-09-14 DIAGNOSIS — J9621 Acute and chronic respiratory failure with hypoxia: Secondary | ICD-10-CM | POA: Diagnosis not present

## 2021-09-14 DIAGNOSIS — Z9981 Dependence on supplemental oxygen: Secondary | ICD-10-CM | POA: Diagnosis not present

## 2021-09-14 DIAGNOSIS — Z87440 Personal history of urinary (tract) infections: Secondary | ICD-10-CM | POA: Diagnosis not present

## 2021-09-14 DIAGNOSIS — N186 End stage renal disease: Secondary | ICD-10-CM | POA: Diagnosis not present

## 2021-09-14 DIAGNOSIS — I214 Non-ST elevation (NSTEMI) myocardial infarction: Secondary | ICD-10-CM | POA: Diagnosis not present

## 2021-09-14 DIAGNOSIS — I5022 Chronic systolic (congestive) heart failure: Secondary | ICD-10-CM | POA: Diagnosis not present

## 2021-09-15 DIAGNOSIS — Z9981 Dependence on supplemental oxygen: Secondary | ICD-10-CM | POA: Diagnosis not present

## 2021-09-15 DIAGNOSIS — J9621 Acute and chronic respiratory failure with hypoxia: Secondary | ICD-10-CM | POA: Diagnosis not present

## 2021-09-15 DIAGNOSIS — N186 End stage renal disease: Secondary | ICD-10-CM | POA: Diagnosis not present

## 2021-09-15 DIAGNOSIS — I5022 Chronic systolic (congestive) heart failure: Secondary | ICD-10-CM | POA: Diagnosis not present

## 2021-09-15 DIAGNOSIS — I214 Non-ST elevation (NSTEMI) myocardial infarction: Secondary | ICD-10-CM | POA: Diagnosis not present

## 2021-09-15 DIAGNOSIS — Z87440 Personal history of urinary (tract) infections: Secondary | ICD-10-CM | POA: Diagnosis not present

## 2021-09-16 DIAGNOSIS — J9621 Acute and chronic respiratory failure with hypoxia: Secondary | ICD-10-CM | POA: Diagnosis not present

## 2021-09-16 DIAGNOSIS — I5022 Chronic systolic (congestive) heart failure: Secondary | ICD-10-CM | POA: Diagnosis not present

## 2021-09-16 DIAGNOSIS — Z9981 Dependence on supplemental oxygen: Secondary | ICD-10-CM | POA: Diagnosis not present

## 2021-09-16 DIAGNOSIS — Z87440 Personal history of urinary (tract) infections: Secondary | ICD-10-CM | POA: Diagnosis not present

## 2021-09-16 DIAGNOSIS — N186 End stage renal disease: Secondary | ICD-10-CM | POA: Diagnosis not present

## 2021-09-16 DIAGNOSIS — I214 Non-ST elevation (NSTEMI) myocardial infarction: Secondary | ICD-10-CM | POA: Diagnosis not present

## 2021-09-20 DIAGNOSIS — J9621 Acute and chronic respiratory failure with hypoxia: Secondary | ICD-10-CM | POA: Diagnosis not present

## 2021-09-20 DIAGNOSIS — I214 Non-ST elevation (NSTEMI) myocardial infarction: Secondary | ICD-10-CM | POA: Diagnosis not present

## 2021-09-20 DIAGNOSIS — Z9981 Dependence on supplemental oxygen: Secondary | ICD-10-CM | POA: Diagnosis not present

## 2021-09-20 DIAGNOSIS — N186 End stage renal disease: Secondary | ICD-10-CM | POA: Diagnosis not present

## 2021-09-20 DIAGNOSIS — I5022 Chronic systolic (congestive) heart failure: Secondary | ICD-10-CM | POA: Diagnosis not present

## 2021-09-20 DIAGNOSIS — Z87440 Personal history of urinary (tract) infections: Secondary | ICD-10-CM | POA: Diagnosis not present

## 2021-09-21 DIAGNOSIS — Z9981 Dependence on supplemental oxygen: Secondary | ICD-10-CM | POA: Diagnosis not present

## 2021-09-21 DIAGNOSIS — Z87440 Personal history of urinary (tract) infections: Secondary | ICD-10-CM | POA: Diagnosis not present

## 2021-09-21 DIAGNOSIS — N186 End stage renal disease: Secondary | ICD-10-CM | POA: Diagnosis not present

## 2021-09-21 DIAGNOSIS — J9621 Acute and chronic respiratory failure with hypoxia: Secondary | ICD-10-CM | POA: Diagnosis not present

## 2021-09-21 DIAGNOSIS — I5022 Chronic systolic (congestive) heart failure: Secondary | ICD-10-CM | POA: Diagnosis not present

## 2021-09-21 DIAGNOSIS — I214 Non-ST elevation (NSTEMI) myocardial infarction: Secondary | ICD-10-CM | POA: Diagnosis not present

## 2021-09-22 DIAGNOSIS — Z87440 Personal history of urinary (tract) infections: Secondary | ICD-10-CM | POA: Diagnosis not present

## 2021-09-22 DIAGNOSIS — Z9981 Dependence on supplemental oxygen: Secondary | ICD-10-CM | POA: Diagnosis not present

## 2021-09-22 DIAGNOSIS — I214 Non-ST elevation (NSTEMI) myocardial infarction: Secondary | ICD-10-CM | POA: Diagnosis not present

## 2021-09-22 DIAGNOSIS — I5022 Chronic systolic (congestive) heart failure: Secondary | ICD-10-CM | POA: Diagnosis not present

## 2021-09-22 DIAGNOSIS — N186 End stage renal disease: Secondary | ICD-10-CM | POA: Diagnosis not present

## 2021-09-22 DIAGNOSIS — J9621 Acute and chronic respiratory failure with hypoxia: Secondary | ICD-10-CM | POA: Diagnosis not present

## 2021-09-23 DIAGNOSIS — Z9981 Dependence on supplemental oxygen: Secondary | ICD-10-CM | POA: Diagnosis not present

## 2021-09-23 DIAGNOSIS — Z87440 Personal history of urinary (tract) infections: Secondary | ICD-10-CM | POA: Diagnosis not present

## 2021-09-23 DIAGNOSIS — J9621 Acute and chronic respiratory failure with hypoxia: Secondary | ICD-10-CM | POA: Diagnosis not present

## 2021-09-23 DIAGNOSIS — I5022 Chronic systolic (congestive) heart failure: Secondary | ICD-10-CM | POA: Diagnosis not present

## 2021-09-23 DIAGNOSIS — N186 End stage renal disease: Secondary | ICD-10-CM | POA: Diagnosis not present

## 2021-09-23 DIAGNOSIS — I214 Non-ST elevation (NSTEMI) myocardial infarction: Secondary | ICD-10-CM | POA: Diagnosis not present

## 2021-09-27 DIAGNOSIS — I5022 Chronic systolic (congestive) heart failure: Secondary | ICD-10-CM | POA: Diagnosis not present

## 2021-09-27 DIAGNOSIS — J9621 Acute and chronic respiratory failure with hypoxia: Secondary | ICD-10-CM | POA: Diagnosis not present

## 2021-09-27 DIAGNOSIS — Z79899 Other long term (current) drug therapy: Secondary | ICD-10-CM | POA: Diagnosis not present

## 2021-09-27 DIAGNOSIS — R531 Weakness: Secondary | ICD-10-CM | POA: Diagnosis not present

## 2021-09-27 DIAGNOSIS — Z9981 Dependence on supplemental oxygen: Secondary | ICD-10-CM | POA: Diagnosis not present

## 2021-09-27 DIAGNOSIS — R52 Pain, unspecified: Secondary | ICD-10-CM | POA: Diagnosis not present

## 2021-09-27 DIAGNOSIS — I214 Non-ST elevation (NSTEMI) myocardial infarction: Secondary | ICD-10-CM | POA: Diagnosis not present

## 2021-09-27 DIAGNOSIS — Z87440 Personal history of urinary (tract) infections: Secondary | ICD-10-CM | POA: Diagnosis not present

## 2021-09-27 DIAGNOSIS — N186 End stage renal disease: Secondary | ICD-10-CM | POA: Diagnosis not present

## 2021-09-28 DIAGNOSIS — Z9981 Dependence on supplemental oxygen: Secondary | ICD-10-CM | POA: Diagnosis not present

## 2021-09-28 DIAGNOSIS — Z87440 Personal history of urinary (tract) infections: Secondary | ICD-10-CM | POA: Diagnosis not present

## 2021-09-28 DIAGNOSIS — I5022 Chronic systolic (congestive) heart failure: Secondary | ICD-10-CM | POA: Diagnosis not present

## 2021-09-28 DIAGNOSIS — I214 Non-ST elevation (NSTEMI) myocardial infarction: Secondary | ICD-10-CM | POA: Diagnosis not present

## 2021-09-28 DIAGNOSIS — N186 End stage renal disease: Secondary | ICD-10-CM | POA: Diagnosis not present

## 2021-09-28 DIAGNOSIS — J9621 Acute and chronic respiratory failure with hypoxia: Secondary | ICD-10-CM | POA: Diagnosis not present

## 2021-09-28 DIAGNOSIS — Z992 Dependence on renal dialysis: Secondary | ICD-10-CM | POA: Diagnosis not present

## 2021-09-29 DIAGNOSIS — I214 Non-ST elevation (NSTEMI) myocardial infarction: Secondary | ICD-10-CM | POA: Diagnosis not present

## 2021-09-29 DIAGNOSIS — Z87440 Personal history of urinary (tract) infections: Secondary | ICD-10-CM | POA: Diagnosis not present

## 2021-09-29 DIAGNOSIS — I739 Peripheral vascular disease, unspecified: Secondary | ICD-10-CM | POA: Diagnosis not present

## 2021-09-29 DIAGNOSIS — N186 End stage renal disease: Secondary | ICD-10-CM | POA: Diagnosis not present

## 2021-09-29 DIAGNOSIS — B351 Tinea unguium: Secondary | ICD-10-CM | POA: Diagnosis not present

## 2021-09-29 DIAGNOSIS — J9621 Acute and chronic respiratory failure with hypoxia: Secondary | ICD-10-CM | POA: Diagnosis not present

## 2021-09-29 DIAGNOSIS — I5022 Chronic systolic (congestive) heart failure: Secondary | ICD-10-CM | POA: Diagnosis not present

## 2021-09-29 DIAGNOSIS — Z9981 Dependence on supplemental oxygen: Secondary | ICD-10-CM | POA: Diagnosis not present

## 2021-09-30 DIAGNOSIS — I5022 Chronic systolic (congestive) heart failure: Secondary | ICD-10-CM | POA: Diagnosis not present

## 2021-09-30 DIAGNOSIS — Z87440 Personal history of urinary (tract) infections: Secondary | ICD-10-CM | POA: Diagnosis not present

## 2021-09-30 DIAGNOSIS — N186 End stage renal disease: Secondary | ICD-10-CM | POA: Diagnosis not present

## 2021-09-30 DIAGNOSIS — I214 Non-ST elevation (NSTEMI) myocardial infarction: Secondary | ICD-10-CM | POA: Diagnosis not present

## 2021-09-30 DIAGNOSIS — J9621 Acute and chronic respiratory failure with hypoxia: Secondary | ICD-10-CM | POA: Diagnosis not present

## 2021-09-30 DIAGNOSIS — Z9981 Dependence on supplemental oxygen: Secondary | ICD-10-CM | POA: Diagnosis not present

## 2021-10-02 DIAGNOSIS — N186 End stage renal disease: Secondary | ICD-10-CM | POA: Diagnosis not present

## 2021-10-02 DIAGNOSIS — Z992 Dependence on renal dialysis: Secondary | ICD-10-CM | POA: Diagnosis not present

## 2021-10-03 DIAGNOSIS — I214 Non-ST elevation (NSTEMI) myocardial infarction: Secondary | ICD-10-CM | POA: Diagnosis not present

## 2021-10-03 DIAGNOSIS — I959 Hypotension, unspecified: Secondary | ICD-10-CM | POA: Diagnosis not present

## 2021-10-03 DIAGNOSIS — D509 Iron deficiency anemia, unspecified: Secondary | ICD-10-CM | POA: Diagnosis not present

## 2021-10-03 DIAGNOSIS — K7200 Acute and subacute hepatic failure without coma: Secondary | ICD-10-CM | POA: Diagnosis not present

## 2021-10-03 DIAGNOSIS — K7581 Nonalcoholic steatohepatitis (NASH): Secondary | ICD-10-CM | POA: Diagnosis not present

## 2021-10-03 DIAGNOSIS — I429 Cardiomyopathy, unspecified: Secondary | ICD-10-CM | POA: Diagnosis not present

## 2021-10-03 DIAGNOSIS — J90 Pleural effusion, not elsewhere classified: Secondary | ICD-10-CM | POA: Diagnosis not present

## 2021-10-03 DIAGNOSIS — I5022 Chronic systolic (congestive) heart failure: Secondary | ICD-10-CM | POA: Diagnosis not present

## 2021-10-03 DIAGNOSIS — N186 End stage renal disease: Secondary | ICD-10-CM | POA: Diagnosis not present

## 2021-10-03 DIAGNOSIS — Z87440 Personal history of urinary (tract) infections: Secondary | ICD-10-CM | POA: Diagnosis not present

## 2021-10-03 DIAGNOSIS — I50812 Chronic right heart failure: Secondary | ICD-10-CM | POA: Diagnosis not present

## 2021-10-03 DIAGNOSIS — J9621 Acute and chronic respiratory failure with hypoxia: Secondary | ICD-10-CM | POA: Diagnosis not present

## 2021-10-03 DIAGNOSIS — Z9981 Dependence on supplemental oxygen: Secondary | ICD-10-CM | POA: Diagnosis not present

## 2021-10-04 DIAGNOSIS — I214 Non-ST elevation (NSTEMI) myocardial infarction: Secondary | ICD-10-CM | POA: Diagnosis not present

## 2021-10-04 DIAGNOSIS — I5022 Chronic systolic (congestive) heart failure: Secondary | ICD-10-CM | POA: Diagnosis not present

## 2021-10-04 DIAGNOSIS — Z87440 Personal history of urinary (tract) infections: Secondary | ICD-10-CM | POA: Diagnosis not present

## 2021-10-04 DIAGNOSIS — N186 End stage renal disease: Secondary | ICD-10-CM | POA: Diagnosis not present

## 2021-10-04 DIAGNOSIS — J9621 Acute and chronic respiratory failure with hypoxia: Secondary | ICD-10-CM | POA: Diagnosis not present

## 2021-10-04 DIAGNOSIS — Z9981 Dependence on supplemental oxygen: Secondary | ICD-10-CM | POA: Diagnosis not present

## 2021-10-05 DIAGNOSIS — J9621 Acute and chronic respiratory failure with hypoxia: Secondary | ICD-10-CM | POA: Diagnosis not present

## 2021-10-05 DIAGNOSIS — Z87440 Personal history of urinary (tract) infections: Secondary | ICD-10-CM | POA: Diagnosis not present

## 2021-10-05 DIAGNOSIS — I214 Non-ST elevation (NSTEMI) myocardial infarction: Secondary | ICD-10-CM | POA: Diagnosis not present

## 2021-10-05 DIAGNOSIS — I5022 Chronic systolic (congestive) heart failure: Secondary | ICD-10-CM | POA: Diagnosis not present

## 2021-10-05 DIAGNOSIS — N186 End stage renal disease: Secondary | ICD-10-CM | POA: Diagnosis not present

## 2021-10-05 DIAGNOSIS — Z9981 Dependence on supplemental oxygen: Secondary | ICD-10-CM | POA: Diagnosis not present

## 2021-10-05 NOTE — Progress Notes (Deleted)
Cardiology Office Note   Date:  10/05/2021   ID:  BURK HOCTOR, DOB Jul 10, 1950, MRN [id number]  PCP:  [name], [name], MD  Cardiologist:   Minus Breeding, MD Referring:  ***  No chief complaint on file.     History of Present Illness: FINNEAS MATHE is a 71 y.o. male who presents for follow up of ***.  He has hx of non obstructive CAD cath 2008. Mid RCA 50%. EF was 25% at that time. He has had low EF 35-40 in 2016 thought 2/2 tachy-mediated CM in Afib.  Had GI bleed in April requiring transfusion with hematemesis. Was on eliquis which was held in 05/2021. GI could not perform EGD 2/2 instability at that time. See DC summary 05/16/2021. At that time had hypotension and shock in the setting of RV failure. He was placed on midodrine. He was not tolerating IHD at that time and palliative was recommended. He is not an ADHF candidate due to multiple co-morbidities.   He had placement of a right brachiocephalic fistula by Dr. Donnetta Hutching from vascular on 03/02/2021. He was found at the dialysis center to no longer have fistula thrill or bruit and was seen for further evaluation. He did an [place] in the office of his cephalic vein and it is patent above the anastomosis but is occluded for several centimeters along the anastomosis.  He does have a patent basilic vein.  He has multiple small branches near the antecubital space and a larger basilic vein in the mid upper arm. He recommended a prosthetic graft. Per his note, Mr. Catalfamo was on coumadin with last dose   He was admitted in August with non STEMI.  He was managed conservatively per his wishes.  ***     History and all data above reviewed.  Patient examined.  I agree with the findings as above.   The patient has no acute SOB or chest pain.  He is complaining of back pain.  The patient exam reveals TDD:[id number]  ,  Lungs: Decreased breath sounds  ,  Abd: Positive bowel sounds, no rebound no guarding, Ext Diffuse edema  .  All available labs,  radiology testing, previous records reviewed. Agree with documented assessment and plan.   Elevated cardiac enzymes.  Admitted with, among other things, elevated troponin.  However, he was on hospice care previously and will be moving to hospice apparently.  Conservative and comfort measures are planned. I am not sure of the plan for dialysis.   Can discontinue heparin.  He is on ASA now and that does not look like it was on his previous list.  I would continue this if no active GI bleeding.  I think it is reasonable to have him on the higher dose of Lipitor we started.  I don't think the is a need for invasive evaluation.  His biggest issues is RV failure but his  BP is tolerating this although labile.  Continue midodrine.   Jeneen Rinks Rheanne Cortopassi  11:08 AM  08/06/2021  HE had respiratory failure with intubation that same year with an EF of  65%.  He had AKI.  He has had severely elevated pulmonary pressures and RV dysfunction.  He had anemia and GI bleed in 2020.  ***     *** with history of chronic atrial fibrillation, chronic anticoagulation therapy w/ coumadin, h/o systolic HF (felt to be tachy-mediated, w/ eventual recovery of LVEF), chronic diastolic CHF, RV failure w/ pulmonary HTN, Diabetes, Obesity, OHS/OSA w/  poor compliance w/ CPAP, h/o AKI, h/o GIB, colon cancer treated w/ resection and h/o MGUS.     Patient was admitted in 02/2014 with hypoxic respiratory failure with acute systolic CHF and possible PNA.  He was in atrial fibrillation with RVR and developed AKI. Cardiomyopathy (EF 35-40% by echo in 2016) thought to be tachycardia-mediated at the time.  He was admitted again in 4/16 with hypotension and AKI as well as multiple metabolic derangements.  He required CVVH.  Echo at that time showed EF 60-65%.  He was again admitted in 5/16, again with hypoxic respiratory failure and was intubated.  EF 60-65% by echo on this admission.  He again had AKI and had HD x 1. Renal function again recovered. Also, per  records, he had a LHC in 2008 that showed no significant CAD.    Had repeat echo 12/2018 which showed normal LVEF 50-55% w/ moderate LVH, normal LA size, mild MR, severely elevated pulmonary artery systolic pressure, 77 mmHg and moderately reduced right ventricular systolic function. Echo was obtained while admitted for acute anemia 2/2 acute GIB, in the setting of supratherpaeutic INR. INR was > 10.  Hgb 5.1. INR was reversed w/ Kcentra + Vit K. Treated w/ transfusion. Underwent GI w/u. EGD showed chronic gastritis but no clear source of bleeding. Biopsy negative for H Pylori. Colonoscopy showed several polyps, removed and biopsied. No high grade dysplasia. He was placed back on Coumadin. INRs followed by PCP.    Admitted to [facility] for acute on chronic CHF 3/21. Also w/ AKI on admit. SCr was 1.8 (baseline 1.3-1.5). He was started on IV lasix for diuresis, 60 mg bid. Diuresis sluggish and eventually changed to lasix gtt. SCr has worsened w/ attempts at diuresis, w/ SCr rising to 2.3. Echo repeated. LVEF 55-60%. RV severely enlarged w/ severely reduced RV systolic function. Pulmonary artery systolic pressure severely elevated at 76 mmhg. There is mild TR. Also mild to moderate aortic valve sclerosis/calcification is present, without any evidence of aortic stenosis. He was subsequently transferred to [facility] for further management of his HF w/ formal RHC and AHF consultation.  On arrival to [facility], He was placed on high dose IV Lasix w/ better urinary response/ diuresis. RHC 4/8 with elevated filling pressures, mixed pulmonary venous/pulmonary hypertension. V/Q scan negative for chronic PE. He was continued on IV diuretics and diuresed > 20 lb and placed on PO torsemide 40 mg bid.  SCr improved w/ diuresis. Also started on trial of sildenafil 20 mg tid. Advised to continue supplemental home O2 and to restart regular use of CPAP. Also of note, a multiple myeloma panel was done given MGUS and showed elevated M spike protein.  Urine immunofixation and UPEP both unremarkable. Discharge wt was 261 lb.    Patient was admitted in 6/22 with syncope.  He was at his PCP's office, stood up, and passed out.  AED was applied, no shock. CPR was done with ROSC < 1 minute. Echo showed EF 50-55%, mild LVH, D-shaped septum, severe RV enlargement with severely decreased RV systolic function, PASP 90, IVC dilated.  He was discharged to a rehab facility in [place]. He was supposed to get a Zio monitor at discharge but this was not done.  Creatinine was elevated to 2.5 during this hospitalization.    In 10/22, he was admitted with GI bleeding/hematochezia.  He had large, oozing rectal polyps with high grade dysplasia.     Admitted to [facility] 11/22/20 with A/C HFpEF. Failed escalating diuretic regimen  at home. Weight was up 30 pounds from August. [place] abd with moderate ascites. Started on IV lasix. Echo this admit EF 55-60% and severe RV dysfunction + dilated IVC. Sluggish diuresis noted. Had Paracentesis with 2.4 liters removed. Started on milrinone and Lasix gtt. Nephrology consulted and patient required transfer to ICU for CRRT. He remained in afib but rate controlled on amiodarone gtt. Able to wean gtts and transition to iHD T/Th/Sat, 1st session 12/05/20. Seen by palliative care, patient elected to be full code. He was discharged back to [facility], weight 229 lbs.   Today he returns for post hospital HF follow up. Overall feeling fine. He is now at [facility]. He is able to walk short distances with a walker to does not have significant dyspnea with this. He makes very little urine. Had a UTI with hematuria recently, given abx and hematuria has since cleared up. PCP referred him to Urology. Denies palpitations, CP, dizziness, edema, or PND/Orthopnea. Appetite ok. No fever or chills. Taking all medications provided by facility. He is now T/TH/Sat on dialysis, has not had any low BP at dialysis. He continuously wears 2L oxygen, not wearing CPAP at  night.      Past Medical History:  Diagnosis Date   Anemia    Anxiety    Asthma    CAD (coronary artery disease)    LHC 2/08:  mRCA 50%, o/w no sig CAD, EF 25%   Cardiomyopathy ([place])    EF 35-40% in 2/16 in setting of AF with RVR >> echo 5/16 with improved LVF with EF 65-70%   CHF (congestive heart failure) (HCC)    Chronic atrial fibrillation (HCC)    Chronic diastolic heart failure (HCC)    HFPF - EF ~65-70% (OFTEN EXACERBATED BY AFIB)   Cirrhosis of liver (HCC)    CKD (chronic kidney disease)    hx of worsening renal failure and hyperK+ in setting of acute diast CHF >> required CVVHD in 4/16 and dialysis x 1 in 5/16   CKD stage 3 due to type 2 diabetes mellitus ([place]) 09/17/2015   Colon cancer ([place])    Colon polyps 09/21/2012   Tubular adenoma   Diabetes (HCC)    GERD (gastroesophageal reflux disease)    Heart murmur    Hyperlipidemia    Hypertension    Hypothyroidism    Insomnia    Nonalcoholic steatohepatitis    Obesity hypoventilation syndrome ([place])    Pulmonary hypertension ([place])    Renal insufficiency    Sleep apnea    Thrombocytopenia ([place])     Past Surgical History:  Procedure Laterality Date   AV FISTULA PLACEMENT Right 03/02/2021   Procedure: RIGHT ARM ARTERIOVENOUS (AV) FISTULA CREATION;  Surgeon: [name], MD;  Location: [facility];  Service: Vascular;  Laterality: Right;   AV FISTULA PLACEMENT Right 08/03/2021   Procedure: INSERTION OF RIGHT UPPER ARM ARTERIOVENOUS (AV) GORE-TEX GRAFT;  Surgeon: [name], MD;  Location: [facility];  Service: Vascular;  Laterality: Right;   CARDIAC ELECTROPHYSIOLOGY MAPPING AND ABLATION  05/2021   CIRCUMCISION     COLON RESECTION     COLONOSCOPY N/A 09/21/2012   Procedure: COLONOSCOPY;  Surgeon: Inda Castle, MD;  Location: [facility];  Service: Endoscopy;  Laterality: N/A;   COLONOSCOPY N/A 12/28/2018   Procedure: COLONOSCOPY;  Surgeon: Rogene Houston, MD;  Location: [facility];  Service: Endoscopy;   Laterality: N/A;   COLONOSCOPY WITH PROPOFOL N/A 10/17/2020   Procedure: COLONOSCOPY WITH  PROPOFOL;  Surgeon: Montez Morita, Quillian Quince, MD;  Location: [facility];  Service: Gastroenterology;  Laterality: N/A;   COLONOSCOPY WITH PROPOFOL N/A 04/25/2021   Procedure: COLONOSCOPY WITH PROPOFOL;  Surgeon: Rogene Houston, MD;  Location: [facility];  Service: Endoscopy;  Laterality: N/A;   ENTEROSCOPY N/A 06/24/2020   Procedure: ENTEROSCOPY;  Surgeon: Gatha Mayer, MD;  Location: [facility] END[facility];  Service: Endoscopy;  Laterality: N/A;   ESOPHAGOGASTRODUODENOSCOPY N/A 12/28/2018   Procedure: ESOPHAGOGASTRODUODENOSCOPY (EGD);  Surgeon: Rogene Houston, MD;  Location: [facility];  Service: Endoscopy;  Laterality: N/A;   HOT HEMOSTASIS  04/25/2021   Procedure: HOT HEMOSTASIS (ARGON PLASMA COAGULATION/BICAP);  Surgeon: Rogene Houston, MD;  Location: [facility];  Service: Endoscopy;;   IR FLUORO GUIDE CV LINE RIGHT  12/10/2020   IR US GUIDE [place] RIGHT  12/11/2020   POLYPECTOMY  10/17/2020   Procedure: POLYPECTOMY;  Surgeon: Harvel Quale, MD;  Location: [facility];  Service: Gastroenterology;;  rectal, transverse, descending, ascending, sigmoid   RIGHT HEART CATH N/A 04/11/2019   Procedure: RIGHT HEART CATH;  Surgeon: Larey Dresser, MD;  Location: [place] [facility];  Service: Cardiovascular;  Laterality: N/A;   US ECHOCARDIOGRAPHY  03/04/2010   abnormal study     Current Outpatient Medications  Medication Sig Dispense Refill   acetaminophen (TYLENOL) 160 MG/5ML liquid Take 650 mg by mouth every 4 (four) hours as needed for fever.     ammonium lactate (AMLACTIN) 12 % cream Apply 1 Application topically 2 (two) times daily. To both feet     aspirin 81 MG chewable tablet Chew 1 tablet (81 mg total) by mouth daily. 30 tablet 2   atorvastatin (LIPITOR) 80 MG tablet Take 1 tablet (80 mg total) by mouth daily. 30 tablet 2   B Complex-C (B-COMPLEX WITH VITAMIN C)  tablet Take 1 tablet by mouth daily.     Cholecalciferol (VITAMIN D) 125 MCG (5000 UT) CAPS Take 5,000 Units by mouth daily.     guaiFENesin (ROBITUSSIN) 100 MG/5ML liquid Take 100 mg by mouth every 4 (four) hours as needed for cough.     hydrocortisone (CORTEF) 10 MG tablet Take 1 tablet (10 mg total) by mouth 2 (two) times daily. (Patient taking differently: Take 10 mg by mouth every 12 (twelve) hours.) 60 tablet 2   insulin aspart (NOVOLOG) 100 UNIT/ML injection Before each meal 3 times a day, 140-199 - 2 units, 200-250 - 4 units, 251-299 - 6 units,  300-349 - 8 units,  350 or above 10 units. Insulin PEN if approved, provide syringes and needles if needed. (Patient taking differently: Inject 0-10 Units into the skin See admin instructions. Before each meal 3 times a day, CBG 140-199 : 2 units CBG 200-250 : 4 units CBG 251-299 : 6 units CBG 300-349 : 8 units CBG 350-399 : 10 units.) 10 mL 0   lactulose (CHRONULAC) 10 GM/15ML solution Take 45 mLs (30 g total) by mouth 2 (two) times daily. 2700 mL 2   levothyroxine (SYNTHROID) 100 MCG tablet Take 100 mcg by mouth daily.     melatonin 5 MG TABS Take 5 mg by mouth at bedtime as needed (insomnia).     midodrine (PROAMATINE) 10 MG tablet Take 1 tablet (10 mg total) by mouth 3 (three) times daily with meals. (Patient taking differently: Take 10 mg by mouth 3 (three) times daily.) 90 tablet 3   pantoprazole (PROTONIX) 40 MG tablet Take 1 tablet (40  mg total) by mouth 2 (two) times daily. 60 tablet 1   polyethylene glycol (MIRALAX / GLYCOLAX) 17 g packet Take 17 g by mouth daily. 14 each 0   rifaximin (XIFAXAN) 550 MG TABS tablet Take 1 tablet (550 mg total) by mouth 2 (two) times daily. (Patient taking differently: Take 550 mg by mouth every 12 (twelve) hours.) 60 tablet 1   sildenafil (REVATIO) 20 MG tablet Take 1 tablet (20 mg total) by mouth 3 (three) times daily. 90 tablet 11   simethicone (MYLICON) 720 MG chewable tablet Chew 125 mg by mouth every  8 (eight) hours as needed for flatulence.     traMADol (ULTRAM) 50 MG tablet Take 1 tablet (50 mg total) by mouth every 8 (eight) hours as needed for severe pain. 15 tablet 0   No current facility-administered medications for this visit.   Facility-Administered Medications Ordered in Other Visits  Medication Dose Route Frequency Provider Last Rate Last Admin   0.9 %  sodium chloride infusion  100 mL Intravenous PRN Dwana Melena, MD       0.9 %  sodium chloride infusion  100 mL Intravenous PRN Dwana Melena, MD       alteplase (CATHFLO ACTIVASE) injection 2 mg  2 mg Intracatheter Once PRN Dwana Melena, MD       heparin injection 1,000 Units  1,000 Units Dialysis PRN Dwana Melena, MD       lidocaine (PF) (XYLOCAINE) 1 % injection 5 mL  5 mL Intradermal PRN Dwana Melena, MD       lidocaine-prilocaine (EMLA) cream 1 application.  1 application  Topical PRN Dwana Melena, MD       pentafluoroprop-tetrafluoroeth Landry Dyke) aerosol 1 application.  1 application  Topical PRN Dwana Melena, MD        Allergies:   Codeine, Diclofenac, Ibuprofen, Indomethacin, Meloxicam, and Naproxen    Social History:  The patient  reports that he quit smoking about 38 years ago. His smoking use included cigarettes. He has never used smokeless tobacco. He reports that he does not drink alcohol and does not use drugs.   Family History:  The patient's ***family history includes Breast cancer in his mother; Diabetes in his mother; Heart attack in his father.    ROS:  Please see the history of present illness.   Otherwise, review of systems are positive for {NONE DEFAULTED:18576}.   All other systems are reviewed and negative.    PHYSICAL EXAM: VS:  There were no vitals taken for this visit. , BMI There is no height or weight on file to calculate BMI. GENERAL:  Well appearing HEENT:  Pupils equal round and reactive, fundi not visualized, oral mucosa unremarkable NECK:  No jugular venous distention, waveform within  normal limits, carotid upstroke brisk and symmetric, no bruits, no thyromegaly LYMPHATICS:  No cervical, inguinal adenopathy LUNGS:  Clear to auscultation bilaterally BACK:  No CVA tenderness CHEST:  Unremarkable HEART:  PMI not displaced or sustained,S1 and S2 within normal limits, no S3, no S4, no clicks, no rubs, *** murmurs ABD:  Flat, positive bowel sounds normal in frequency in pitch, no bruits, no rebound, no guarding, no midline pulsatile mass, no hepatomegaly, no splenomegaly EXT:  2 plus pulses throughout, no edema, no cyanosis no clubbing SKIN:  No rashes no nodules NEURO:  Cranial nerves II through XII grossly intact, motor grossly intact throughout PSYCH:  Cognitively intact, oriented to person place and time  EKG:  EKG {ACTION; IS/IS IHD:[id number]} ordered today. The ekg ordered today demonstrates ***   Recent Labs: 08/04/2021: B Natriuretic Peptide 1,412.6; Magnesium 2.0 08/05/2021: TSH 3.124 08/10/2021: ALT 9; BUN 47; Creatinine, Ser 6.24; Hemoglobin 9.9; Platelets 64; Potassium 3.6; Sodium 133    Lipid Panel    Component Value Date/Time   CHOL 97 08/05/2021 0316   CHOL 103 03/19/2020 1311   TRIG 63 08/05/2021 0316   TRIG 177 (H) 07/18/2013 1547   HDL 27 (L) 08/05/2021 0316   HDL 20 (L) 03/19/2020 1311   HDL 24 (L) 07/18/2013 1547   CHOLHDL 3.6 08/05/2021 0316   VLDL 13 08/05/2021 0316   LDLCALC 57 08/05/2021 0316   LDLCALC 58 03/19/2020 1311   LDLCALC 67 07/18/2013 1547   LDLDIRECT 57 05/01/2017 1530      Wt Readings from Last 3 Encounters:  09/01/21 219 lb (99.3 kg)  08/10/21 219 lb 5.7 oz (99.5 kg)  07/30/21 246 lb 14.6 oz (112 kg)      Other studies Reviewed: Additional studies/ records that were reviewed today include: ***. Review of the above records demonstrates:  Please see elsewhere in the note.  ***   ASSESSMENT AND PLAN:  NSTEMI:  ***  RV failure:  ***   Ischemic cardiomyopathy:  ***   ESRD:  ***  Atrial fibrillation:  ***   Hx  of GI bleed:  ***   - *** pt has been changed to DNR and planning to return to SNF-hospice - will hold off on further invasive strategies at this time     Current medicines are reviewed at length with the patient today.  The patient {ACTIONS; HAS/DOES NOT HAVE:19233} concerns regarding medicines.  The following changes have been made:  {PLAN; NO CHANGE:13088:s}  Labs/ tests ordered today include: *** No orders of the defined types were placed in this encounter.    Disposition:   FU with ***    Signed, Minus Breeding, MD  10/05/2021 8:28 PM    [place]

## 2021-10-06 ENCOUNTER — Ambulatory Visit: Payer: Medicare Other | Admitting: Cardiology

## 2021-10-06 DIAGNOSIS — Z9981 Dependence on supplemental oxygen: Secondary | ICD-10-CM | POA: Diagnosis not present

## 2021-10-06 DIAGNOSIS — Z87440 Personal history of urinary (tract) infections: Secondary | ICD-10-CM | POA: Diagnosis not present

## 2021-10-06 DIAGNOSIS — N186 End stage renal disease: Secondary | ICD-10-CM | POA: Diagnosis not present

## 2021-10-06 DIAGNOSIS — I214 Non-ST elevation (NSTEMI) myocardial infarction: Secondary | ICD-10-CM | POA: Diagnosis not present

## 2021-10-06 DIAGNOSIS — I5022 Chronic systolic (congestive) heart failure: Secondary | ICD-10-CM | POA: Diagnosis not present

## 2021-10-06 DIAGNOSIS — J9621 Acute and chronic respiratory failure with hypoxia: Secondary | ICD-10-CM | POA: Diagnosis not present

## 2021-10-07 DIAGNOSIS — Z87440 Personal history of urinary (tract) infections: Secondary | ICD-10-CM | POA: Diagnosis not present

## 2021-10-07 DIAGNOSIS — N186 End stage renal disease: Secondary | ICD-10-CM | POA: Diagnosis not present

## 2021-10-07 DIAGNOSIS — Z9981 Dependence on supplemental oxygen: Secondary | ICD-10-CM | POA: Diagnosis not present

## 2021-10-07 DIAGNOSIS — I5022 Chronic systolic (congestive) heart failure: Secondary | ICD-10-CM | POA: Diagnosis not present

## 2021-10-07 DIAGNOSIS — J9621 Acute and chronic respiratory failure with hypoxia: Secondary | ICD-10-CM | POA: Diagnosis not present

## 2021-10-07 DIAGNOSIS — I214 Non-ST elevation (NSTEMI) myocardial infarction: Secondary | ICD-10-CM | POA: Diagnosis not present

## 2021-10-08 DIAGNOSIS — J9621 Acute and chronic respiratory failure with hypoxia: Secondary | ICD-10-CM | POA: Diagnosis not present

## 2021-10-08 DIAGNOSIS — N186 End stage renal disease: Secondary | ICD-10-CM | POA: Diagnosis not present

## 2021-10-08 DIAGNOSIS — I5022 Chronic systolic (congestive) heart failure: Secondary | ICD-10-CM | POA: Diagnosis not present

## 2021-10-08 DIAGNOSIS — I214 Non-ST elevation (NSTEMI) myocardial infarction: Secondary | ICD-10-CM | POA: Diagnosis not present

## 2021-10-08 DIAGNOSIS — Z87440 Personal history of urinary (tract) infections: Secondary | ICD-10-CM | POA: Diagnosis not present

## 2021-10-08 DIAGNOSIS — Z9981 Dependence on supplemental oxygen: Secondary | ICD-10-CM | POA: Diagnosis not present

## 2021-10-12 DIAGNOSIS — I5022 Chronic systolic (congestive) heart failure: Secondary | ICD-10-CM | POA: Diagnosis not present

## 2021-10-12 DIAGNOSIS — Z87440 Personal history of urinary (tract) infections: Secondary | ICD-10-CM | POA: Diagnosis not present

## 2021-10-12 DIAGNOSIS — I214 Non-ST elevation (NSTEMI) myocardial infarction: Secondary | ICD-10-CM | POA: Diagnosis not present

## 2021-10-12 DIAGNOSIS — J9621 Acute and chronic respiratory failure with hypoxia: Secondary | ICD-10-CM | POA: Diagnosis not present

## 2021-10-12 DIAGNOSIS — Z9981 Dependence on supplemental oxygen: Secondary | ICD-10-CM | POA: Diagnosis not present

## 2021-10-12 DIAGNOSIS — N186 End stage renal disease: Secondary | ICD-10-CM | POA: Diagnosis not present

## 2021-10-13 DIAGNOSIS — Z87440 Personal history of urinary (tract) infections: Secondary | ICD-10-CM | POA: Diagnosis not present

## 2021-10-13 DIAGNOSIS — J9621 Acute and chronic respiratory failure with hypoxia: Secondary | ICD-10-CM | POA: Diagnosis not present

## 2021-10-13 DIAGNOSIS — Z9981 Dependence on supplemental oxygen: Secondary | ICD-10-CM | POA: Diagnosis not present

## 2021-10-13 DIAGNOSIS — N186 End stage renal disease: Secondary | ICD-10-CM | POA: Diagnosis not present

## 2021-10-13 DIAGNOSIS — I5022 Chronic systolic (congestive) heart failure: Secondary | ICD-10-CM | POA: Diagnosis not present

## 2021-10-13 DIAGNOSIS — I214 Non-ST elevation (NSTEMI) myocardial infarction: Secondary | ICD-10-CM | POA: Diagnosis not present

## 2021-10-14 DIAGNOSIS — N186 End stage renal disease: Secondary | ICD-10-CM | POA: Diagnosis not present

## 2021-10-14 DIAGNOSIS — Z9981 Dependence on supplemental oxygen: Secondary | ICD-10-CM | POA: Diagnosis not present

## 2021-10-14 DIAGNOSIS — J9621 Acute and chronic respiratory failure with hypoxia: Secondary | ICD-10-CM | POA: Diagnosis not present

## 2021-10-14 DIAGNOSIS — Z87440 Personal history of urinary (tract) infections: Secondary | ICD-10-CM | POA: Diagnosis not present

## 2021-10-14 DIAGNOSIS — I5022 Chronic systolic (congestive) heart failure: Secondary | ICD-10-CM | POA: Diagnosis not present

## 2021-10-14 DIAGNOSIS — I214 Non-ST elevation (NSTEMI) myocardial infarction: Secondary | ICD-10-CM | POA: Diagnosis not present

## 2021-10-15 DIAGNOSIS — Z87440 Personal history of urinary (tract) infections: Secondary | ICD-10-CM | POA: Diagnosis not present

## 2021-10-15 DIAGNOSIS — N186 End stage renal disease: Secondary | ICD-10-CM | POA: Diagnosis not present

## 2021-10-15 DIAGNOSIS — Z9981 Dependence on supplemental oxygen: Secondary | ICD-10-CM | POA: Diagnosis not present

## 2021-10-15 DIAGNOSIS — I5022 Chronic systolic (congestive) heart failure: Secondary | ICD-10-CM | POA: Diagnosis not present

## 2021-10-15 DIAGNOSIS — I214 Non-ST elevation (NSTEMI) myocardial infarction: Secondary | ICD-10-CM | POA: Diagnosis not present

## 2021-10-15 DIAGNOSIS — J9621 Acute and chronic respiratory failure with hypoxia: Secondary | ICD-10-CM | POA: Diagnosis not present

## 2021-10-18 DIAGNOSIS — I214 Non-ST elevation (NSTEMI) myocardial infarction: Secondary | ICD-10-CM | POA: Diagnosis not present

## 2021-10-18 DIAGNOSIS — N186 End stage renal disease: Secondary | ICD-10-CM | POA: Diagnosis not present

## 2021-10-18 DIAGNOSIS — Z87440 Personal history of urinary (tract) infections: Secondary | ICD-10-CM | POA: Diagnosis not present

## 2021-10-18 DIAGNOSIS — J9621 Acute and chronic respiratory failure with hypoxia: Secondary | ICD-10-CM | POA: Diagnosis not present

## 2021-10-18 DIAGNOSIS — I5022 Chronic systolic (congestive) heart failure: Secondary | ICD-10-CM | POA: Diagnosis not present

## 2021-10-18 DIAGNOSIS — Z9981 Dependence on supplemental oxygen: Secondary | ICD-10-CM | POA: Diagnosis not present

## 2021-10-19 DIAGNOSIS — N186 End stage renal disease: Secondary | ICD-10-CM | POA: Diagnosis not present

## 2021-10-19 DIAGNOSIS — J9621 Acute and chronic respiratory failure with hypoxia: Secondary | ICD-10-CM | POA: Diagnosis not present

## 2021-10-19 DIAGNOSIS — Z87440 Personal history of urinary (tract) infections: Secondary | ICD-10-CM | POA: Diagnosis not present

## 2021-10-19 DIAGNOSIS — Z9981 Dependence on supplemental oxygen: Secondary | ICD-10-CM | POA: Diagnosis not present

## 2021-10-19 DIAGNOSIS — I214 Non-ST elevation (NSTEMI) myocardial infarction: Secondary | ICD-10-CM | POA: Diagnosis not present

## 2021-10-19 DIAGNOSIS — I5022 Chronic systolic (congestive) heart failure: Secondary | ICD-10-CM | POA: Diagnosis not present

## 2021-10-20 DIAGNOSIS — I5022 Chronic systolic (congestive) heart failure: Secondary | ICD-10-CM | POA: Diagnosis not present

## 2021-10-20 DIAGNOSIS — I214 Non-ST elevation (NSTEMI) myocardial infarction: Secondary | ICD-10-CM | POA: Diagnosis not present

## 2021-10-20 DIAGNOSIS — N186 End stage renal disease: Secondary | ICD-10-CM | POA: Diagnosis not present

## 2021-10-20 DIAGNOSIS — Z87440 Personal history of urinary (tract) infections: Secondary | ICD-10-CM | POA: Diagnosis not present

## 2021-10-20 DIAGNOSIS — Z9981 Dependence on supplemental oxygen: Secondary | ICD-10-CM | POA: Diagnosis not present

## 2021-10-20 DIAGNOSIS — J9621 Acute and chronic respiratory failure with hypoxia: Secondary | ICD-10-CM | POA: Diagnosis not present

## 2021-10-21 DIAGNOSIS — I214 Non-ST elevation (NSTEMI) myocardial infarction: Secondary | ICD-10-CM | POA: Diagnosis not present

## 2021-10-21 DIAGNOSIS — J9621 Acute and chronic respiratory failure with hypoxia: Secondary | ICD-10-CM | POA: Diagnosis not present

## 2021-10-21 DIAGNOSIS — I5022 Chronic systolic (congestive) heart failure: Secondary | ICD-10-CM | POA: Diagnosis not present

## 2021-10-21 DIAGNOSIS — N186 End stage renal disease: Secondary | ICD-10-CM | POA: Diagnosis not present

## 2021-10-21 DIAGNOSIS — Z87440 Personal history of urinary (tract) infections: Secondary | ICD-10-CM | POA: Diagnosis not present

## 2021-10-21 DIAGNOSIS — Z9981 Dependence on supplemental oxygen: Secondary | ICD-10-CM | POA: Diagnosis not present

## 2021-10-25 DIAGNOSIS — I5022 Chronic systolic (congestive) heart failure: Secondary | ICD-10-CM | POA: Diagnosis not present

## 2021-10-25 DIAGNOSIS — N186 End stage renal disease: Secondary | ICD-10-CM | POA: Diagnosis not present

## 2021-10-25 DIAGNOSIS — J9621 Acute and chronic respiratory failure with hypoxia: Secondary | ICD-10-CM | POA: Diagnosis not present

## 2021-10-25 DIAGNOSIS — Z9981 Dependence on supplemental oxygen: Secondary | ICD-10-CM | POA: Diagnosis not present

## 2021-10-25 DIAGNOSIS — Z87440 Personal history of urinary (tract) infections: Secondary | ICD-10-CM | POA: Diagnosis not present

## 2021-10-25 DIAGNOSIS — I214 Non-ST elevation (NSTEMI) myocardial infarction: Secondary | ICD-10-CM | POA: Diagnosis not present

## 2021-10-26 DIAGNOSIS — N186 End stage renal disease: Secondary | ICD-10-CM | POA: Diagnosis not present

## 2021-10-26 DIAGNOSIS — J9621 Acute and chronic respiratory failure with hypoxia: Secondary | ICD-10-CM | POA: Diagnosis not present

## 2021-10-26 DIAGNOSIS — Z992 Dependence on renal dialysis: Secondary | ICD-10-CM | POA: Diagnosis not present

## 2021-10-26 DIAGNOSIS — Z87440 Personal history of urinary (tract) infections: Secondary | ICD-10-CM | POA: Diagnosis not present

## 2021-10-26 DIAGNOSIS — I214 Non-ST elevation (NSTEMI) myocardial infarction: Secondary | ICD-10-CM | POA: Diagnosis not present

## 2021-10-26 DIAGNOSIS — I5022 Chronic systolic (congestive) heart failure: Secondary | ICD-10-CM | POA: Diagnosis not present

## 2021-10-26 DIAGNOSIS — Z9981 Dependence on supplemental oxygen: Secondary | ICD-10-CM | POA: Diagnosis not present

## 2021-10-27 DIAGNOSIS — I214 Non-ST elevation (NSTEMI) myocardial infarction: Secondary | ICD-10-CM | POA: Diagnosis not present

## 2021-10-27 DIAGNOSIS — Z87440 Personal history of urinary (tract) infections: Secondary | ICD-10-CM | POA: Diagnosis not present

## 2021-10-27 DIAGNOSIS — Z9981 Dependence on supplemental oxygen: Secondary | ICD-10-CM | POA: Diagnosis not present

## 2021-10-27 DIAGNOSIS — I5022 Chronic systolic (congestive) heart failure: Secondary | ICD-10-CM | POA: Diagnosis not present

## 2021-10-27 DIAGNOSIS — J9621 Acute and chronic respiratory failure with hypoxia: Secondary | ICD-10-CM | POA: Diagnosis not present

## 2021-10-27 DIAGNOSIS — N186 End stage renal disease: Secondary | ICD-10-CM | POA: Diagnosis not present

## 2021-10-28 DIAGNOSIS — J9621 Acute and chronic respiratory failure with hypoxia: Secondary | ICD-10-CM | POA: Diagnosis not present

## 2021-10-28 DIAGNOSIS — Z9981 Dependence on supplemental oxygen: Secondary | ICD-10-CM | POA: Diagnosis not present

## 2021-10-28 DIAGNOSIS — Z87440 Personal history of urinary (tract) infections: Secondary | ICD-10-CM | POA: Diagnosis not present

## 2021-10-28 DIAGNOSIS — I214 Non-ST elevation (NSTEMI) myocardial infarction: Secondary | ICD-10-CM | POA: Diagnosis not present

## 2021-10-28 DIAGNOSIS — I5022 Chronic systolic (congestive) heart failure: Secondary | ICD-10-CM | POA: Diagnosis not present

## 2021-10-28 DIAGNOSIS — N186 End stage renal disease: Secondary | ICD-10-CM | POA: Diagnosis not present

## 2021-10-29 DIAGNOSIS — Z87440 Personal history of urinary (tract) infections: Secondary | ICD-10-CM | POA: Diagnosis not present

## 2021-10-29 DIAGNOSIS — Z9981 Dependence on supplemental oxygen: Secondary | ICD-10-CM | POA: Diagnosis not present

## 2021-10-29 DIAGNOSIS — I5022 Chronic systolic (congestive) heart failure: Secondary | ICD-10-CM | POA: Diagnosis not present

## 2021-10-29 DIAGNOSIS — J9621 Acute and chronic respiratory failure with hypoxia: Secondary | ICD-10-CM | POA: Diagnosis not present

## 2021-10-29 DIAGNOSIS — I214 Non-ST elevation (NSTEMI) myocardial infarction: Secondary | ICD-10-CM | POA: Diagnosis not present

## 2021-10-29 DIAGNOSIS — N186 End stage renal disease: Secondary | ICD-10-CM | POA: Diagnosis not present

## 2021-11-01 DIAGNOSIS — Z87440 Personal history of urinary (tract) infections: Secondary | ICD-10-CM | POA: Diagnosis not present

## 2021-11-01 DIAGNOSIS — Z9981 Dependence on supplemental oxygen: Secondary | ICD-10-CM | POA: Diagnosis not present

## 2021-11-01 DIAGNOSIS — I214 Non-ST elevation (NSTEMI) myocardial infarction: Secondary | ICD-10-CM | POA: Diagnosis not present

## 2021-11-01 DIAGNOSIS — I5022 Chronic systolic (congestive) heart failure: Secondary | ICD-10-CM | POA: Diagnosis not present

## 2021-11-01 DIAGNOSIS — N186 End stage renal disease: Secondary | ICD-10-CM | POA: Diagnosis not present

## 2021-11-01 DIAGNOSIS — J9621 Acute and chronic respiratory failure with hypoxia: Secondary | ICD-10-CM | POA: Diagnosis not present

## 2021-11-02 DIAGNOSIS — Z9981 Dependence on supplemental oxygen: Secondary | ICD-10-CM | POA: Diagnosis not present

## 2021-11-02 DIAGNOSIS — Z87440 Personal history of urinary (tract) infections: Secondary | ICD-10-CM | POA: Diagnosis not present

## 2021-11-02 DIAGNOSIS — J9621 Acute and chronic respiratory failure with hypoxia: Secondary | ICD-10-CM | POA: Diagnosis not present

## 2021-11-02 DIAGNOSIS — Z992 Dependence on renal dialysis: Secondary | ICD-10-CM | POA: Diagnosis not present

## 2021-11-02 DIAGNOSIS — I5022 Chronic systolic (congestive) heart failure: Secondary | ICD-10-CM | POA: Diagnosis not present

## 2021-11-02 DIAGNOSIS — I214 Non-ST elevation (NSTEMI) myocardial infarction: Secondary | ICD-10-CM | POA: Diagnosis not present

## 2021-11-02 DIAGNOSIS — N186 End stage renal disease: Secondary | ICD-10-CM | POA: Diagnosis not present

## 2021-11-03 DIAGNOSIS — Z87440 Personal history of urinary (tract) infections: Secondary | ICD-10-CM | POA: Diagnosis not present

## 2021-11-03 DIAGNOSIS — D509 Iron deficiency anemia, unspecified: Secondary | ICD-10-CM | POA: Diagnosis not present

## 2021-11-03 DIAGNOSIS — K7581 Nonalcoholic steatohepatitis (NASH): Secondary | ICD-10-CM | POA: Diagnosis not present

## 2021-11-03 DIAGNOSIS — J9621 Acute and chronic respiratory failure with hypoxia: Secondary | ICD-10-CM | POA: Diagnosis not present

## 2021-11-03 DIAGNOSIS — J90 Pleural effusion, not elsewhere classified: Secondary | ICD-10-CM | POA: Diagnosis not present

## 2021-11-03 DIAGNOSIS — K7200 Acute and subacute hepatic failure without coma: Secondary | ICD-10-CM | POA: Diagnosis not present

## 2021-11-03 DIAGNOSIS — Z9981 Dependence on supplemental oxygen: Secondary | ICD-10-CM | POA: Diagnosis not present

## 2021-11-03 DIAGNOSIS — I50812 Chronic right heart failure: Secondary | ICD-10-CM | POA: Diagnosis not present

## 2021-11-03 DIAGNOSIS — I214 Non-ST elevation (NSTEMI) myocardial infarction: Secondary | ICD-10-CM | POA: Diagnosis not present

## 2021-11-03 DIAGNOSIS — I5022 Chronic systolic (congestive) heart failure: Secondary | ICD-10-CM | POA: Diagnosis not present

## 2021-11-03 DIAGNOSIS — N186 End stage renal disease: Secondary | ICD-10-CM | POA: Diagnosis not present

## 2021-11-03 DIAGNOSIS — I429 Cardiomyopathy, unspecified: Secondary | ICD-10-CM | POA: Diagnosis not present

## 2021-11-03 DIAGNOSIS — I959 Hypotension, unspecified: Secondary | ICD-10-CM | POA: Diagnosis not present

## 2021-11-04 DIAGNOSIS — Z9981 Dependence on supplemental oxygen: Secondary | ICD-10-CM | POA: Diagnosis not present

## 2021-11-04 DIAGNOSIS — Z87440 Personal history of urinary (tract) infections: Secondary | ICD-10-CM | POA: Diagnosis not present

## 2021-11-04 DIAGNOSIS — N186 End stage renal disease: Secondary | ICD-10-CM | POA: Diagnosis not present

## 2021-11-04 DIAGNOSIS — I5022 Chronic systolic (congestive) heart failure: Secondary | ICD-10-CM | POA: Diagnosis not present

## 2021-11-04 DIAGNOSIS — J9621 Acute and chronic respiratory failure with hypoxia: Secondary | ICD-10-CM | POA: Diagnosis not present

## 2021-11-04 DIAGNOSIS — I214 Non-ST elevation (NSTEMI) myocardial infarction: Secondary | ICD-10-CM | POA: Diagnosis not present

## 2021-11-09 DIAGNOSIS — I214 Non-ST elevation (NSTEMI) myocardial infarction: Secondary | ICD-10-CM | POA: Diagnosis not present

## 2021-11-09 DIAGNOSIS — J9621 Acute and chronic respiratory failure with hypoxia: Secondary | ICD-10-CM | POA: Diagnosis not present

## 2021-11-09 DIAGNOSIS — N186 End stage renal disease: Secondary | ICD-10-CM | POA: Diagnosis not present

## 2021-11-09 DIAGNOSIS — Z87440 Personal history of urinary (tract) infections: Secondary | ICD-10-CM | POA: Diagnosis not present

## 2021-11-09 DIAGNOSIS — I5022 Chronic systolic (congestive) heart failure: Secondary | ICD-10-CM | POA: Diagnosis not present

## 2021-11-09 DIAGNOSIS — Z9981 Dependence on supplemental oxygen: Secondary | ICD-10-CM | POA: Diagnosis not present

## 2021-11-10 DIAGNOSIS — Z992 Dependence on renal dialysis: Secondary | ICD-10-CM | POA: Diagnosis not present

## 2021-11-10 DIAGNOSIS — N186 End stage renal disease: Secondary | ICD-10-CM | POA: Diagnosis not present

## 2021-11-10 DIAGNOSIS — I272 Pulmonary hypertension, unspecified: Secondary | ICD-10-CM | POA: Diagnosis not present

## 2021-11-10 DIAGNOSIS — I4891 Unspecified atrial fibrillation: Secondary | ICD-10-CM | POA: Diagnosis not present

## 2021-11-10 DIAGNOSIS — E039 Hypothyroidism, unspecified: Secondary | ICD-10-CM | POA: Diagnosis not present

## 2021-11-10 DIAGNOSIS — I2510 Atherosclerotic heart disease of native coronary artery without angina pectoris: Secondary | ICD-10-CM | POA: Diagnosis not present

## 2021-11-10 DIAGNOSIS — I10 Essential (primary) hypertension: Secondary | ICD-10-CM | POA: Diagnosis not present

## 2021-11-10 DIAGNOSIS — D631 Anemia in chronic kidney disease: Secondary | ICD-10-CM | POA: Diagnosis not present

## 2021-11-10 DIAGNOSIS — K7581 Nonalcoholic steatohepatitis (NASH): Secondary | ICD-10-CM | POA: Diagnosis not present

## 2021-11-10 DIAGNOSIS — E785 Hyperlipidemia, unspecified: Secondary | ICD-10-CM | POA: Diagnosis not present

## 2021-11-10 DIAGNOSIS — I5033 Acute on chronic diastolic (congestive) heart failure: Secondary | ICD-10-CM | POA: Diagnosis not present

## 2021-11-10 DIAGNOSIS — E119 Type 2 diabetes mellitus without complications: Secondary | ICD-10-CM | POA: Diagnosis not present

## 2021-11-11 DIAGNOSIS — M6281 Muscle weakness (generalized): Secondary | ICD-10-CM | POA: Diagnosis not present

## 2021-11-11 DIAGNOSIS — I5022 Chronic systolic (congestive) heart failure: Secondary | ICD-10-CM | POA: Diagnosis not present

## 2021-11-12 DIAGNOSIS — M6281 Muscle weakness (generalized): Secondary | ICD-10-CM | POA: Diagnosis not present

## 2021-11-12 DIAGNOSIS — I5022 Chronic systolic (congestive) heart failure: Secondary | ICD-10-CM | POA: Diagnosis not present

## 2021-11-16 DIAGNOSIS — F419 Anxiety disorder, unspecified: Secondary | ICD-10-CM | POA: Diagnosis not present

## 2021-11-16 DIAGNOSIS — Z79899 Other long term (current) drug therapy: Secondary | ICD-10-CM | POA: Diagnosis not present

## 2021-11-18 DIAGNOSIS — R112 Nausea with vomiting, unspecified: Secondary | ICD-10-CM | POA: Diagnosis not present

## 2021-11-18 DIAGNOSIS — E785 Hyperlipidemia, unspecified: Secondary | ICD-10-CM | POA: Diagnosis not present

## 2021-11-18 DIAGNOSIS — Z79899 Other long term (current) drug therapy: Secondary | ICD-10-CM | POA: Diagnosis not present

## 2021-12-03 DEATH — deceased

## 2022-11-21 IMAGING — CT CT ABD-PELV W/O CM
3 of 5 series · 16 of 46 positions shown, 18 images · non-contrast
Comparison: October 14, 2020

CLINICAL DATA: Abdominal pain.



[Series 2: axial st · axial · 1.27mm/px · z∈[+1024,+1424]mm · 9 of 100 slices shown, 11 images]
[im 10/100  soft-tissue]
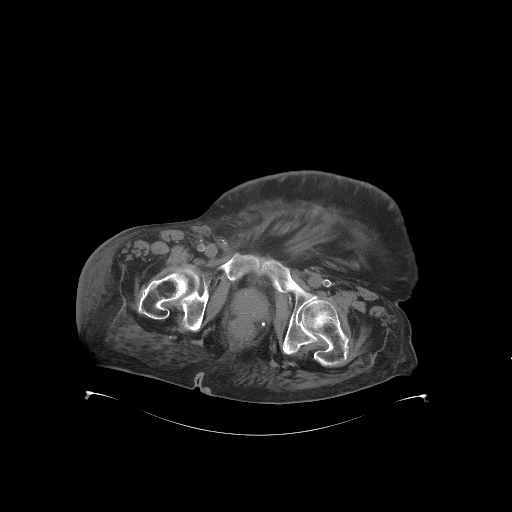
[im 10/100  bone]
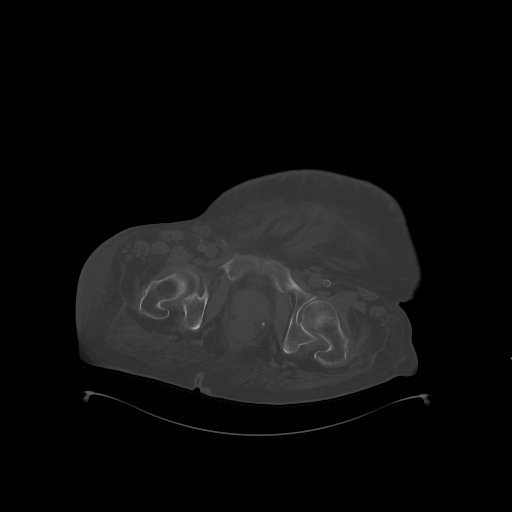
[im 20/100  soft-tissue]
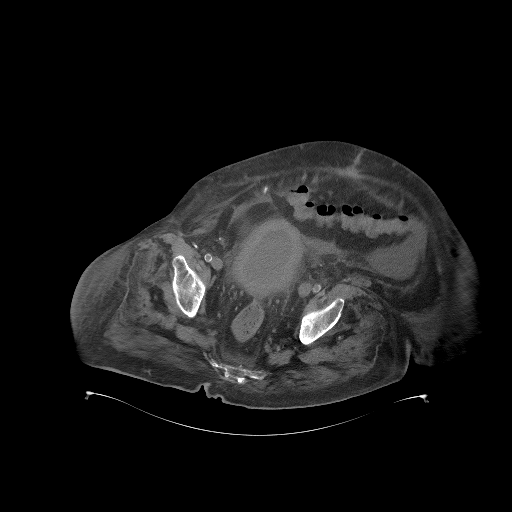
[im 30/100  soft-tissue]
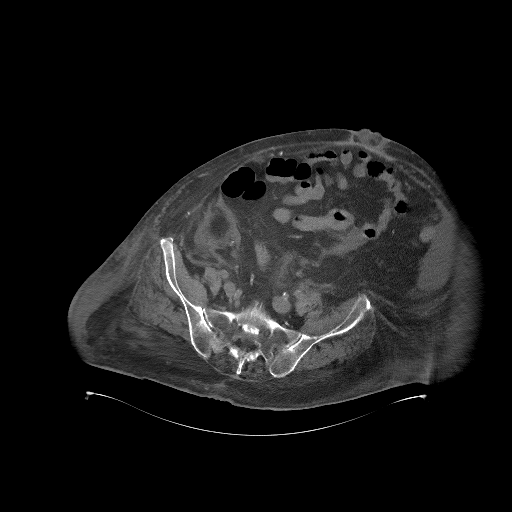
[im 40/100  soft-tissue]
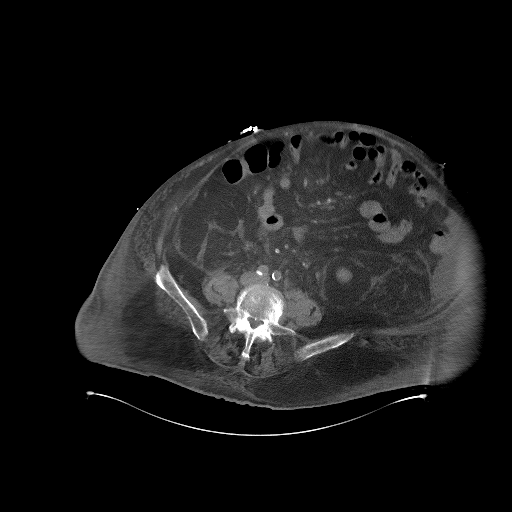
[im 50/100  soft-tissue]
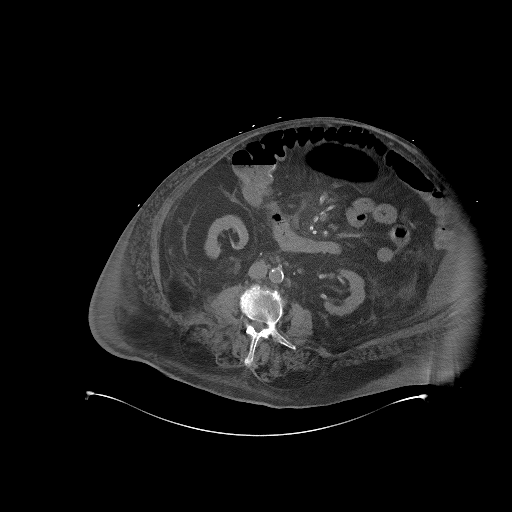
[im 60/100  soft-tissue]
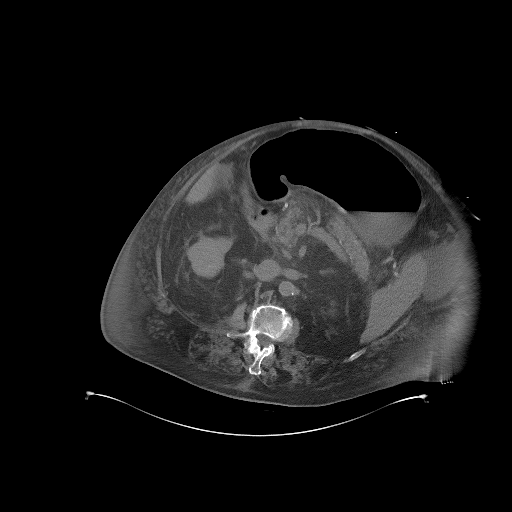
[im 70/100  soft-tissue]
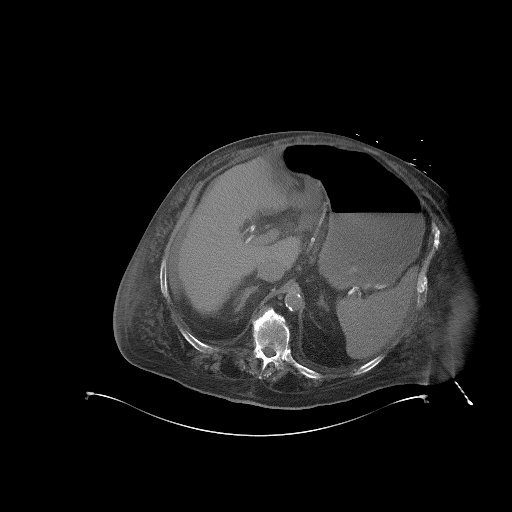
[im 80/100  soft-tissue]
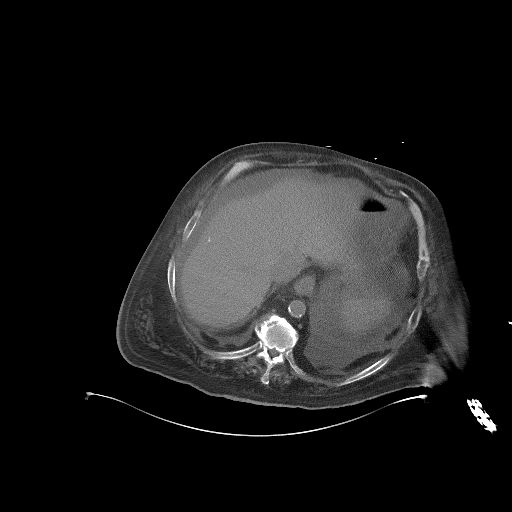
[im 90/100  soft-tissue]
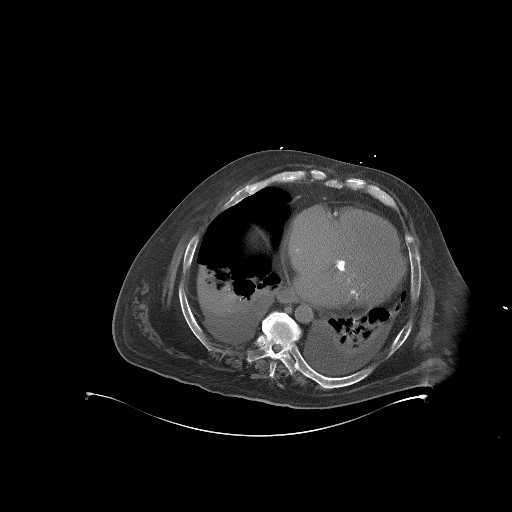
[im 90/100  bone]
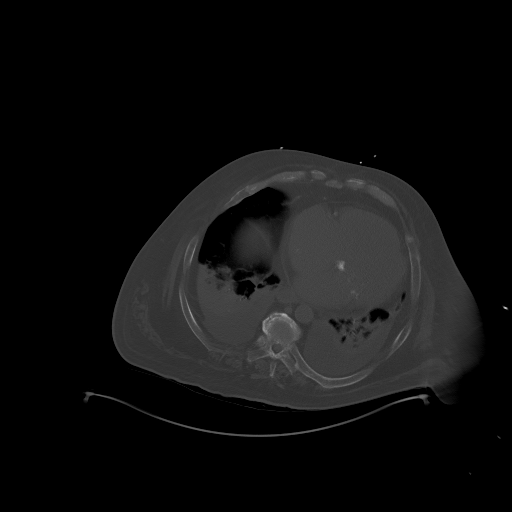

[Series 5: lung bases · axial · 1.27mm/px · z∈[+1311,+1371]mm · 4 of 92 slices shown]
[im 11/92  bone]
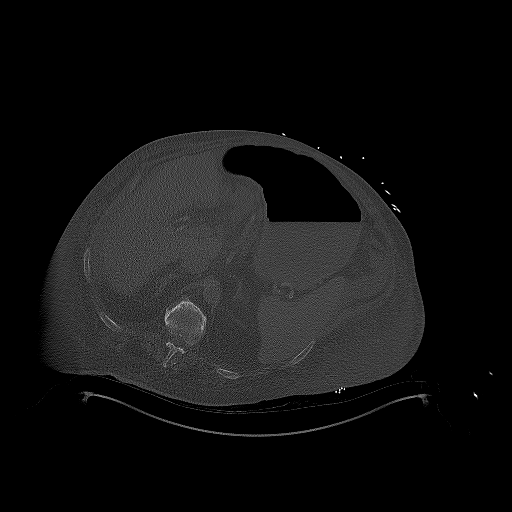
[im 21/92  bone]
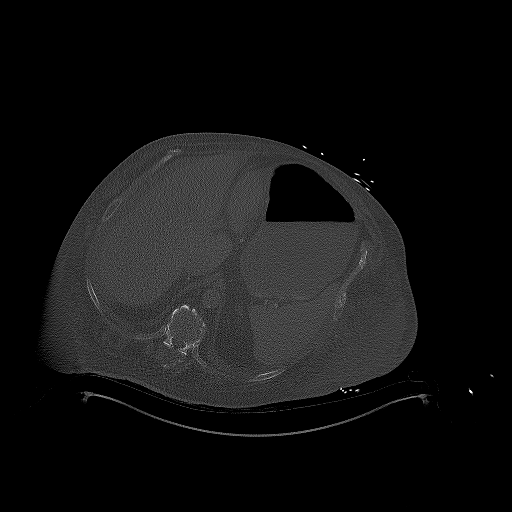
[im 31/92  bone]
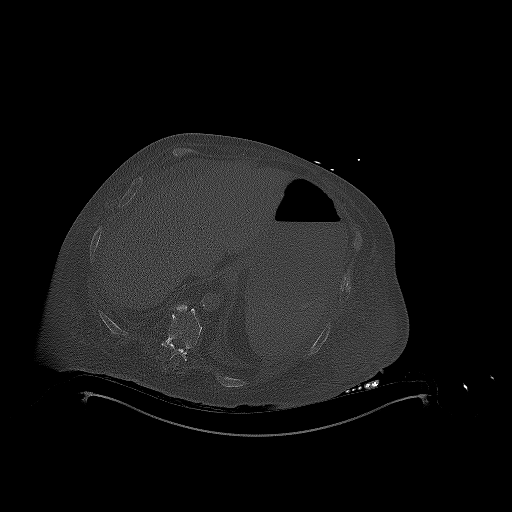
[im 41/92  bone]
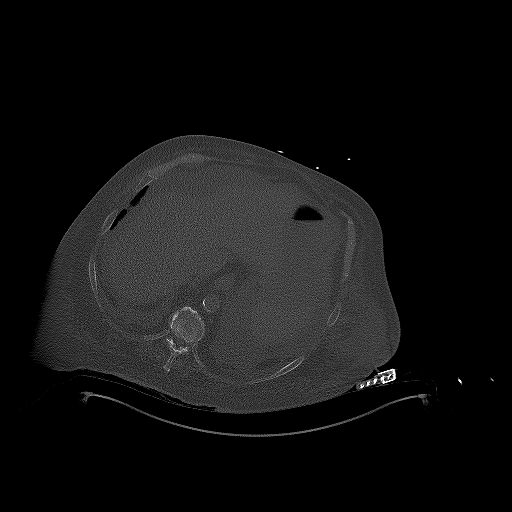

[Series 6: coronal st · coronal · 0.97mm/px · 3 of 130 slices shown]
[im 44/130  soft-tissue]
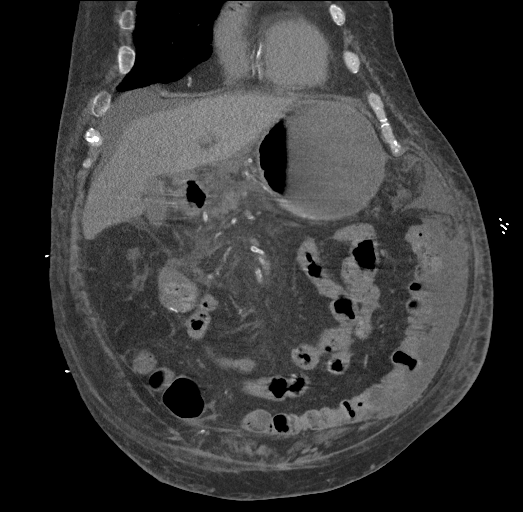
[im 58/130  soft-tissue]
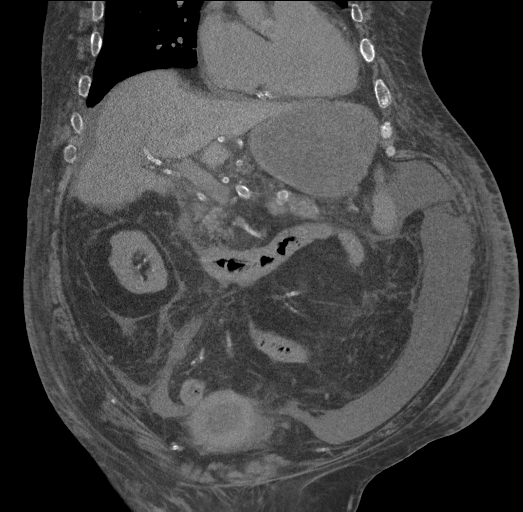
[im 72/130  soft-tissue]
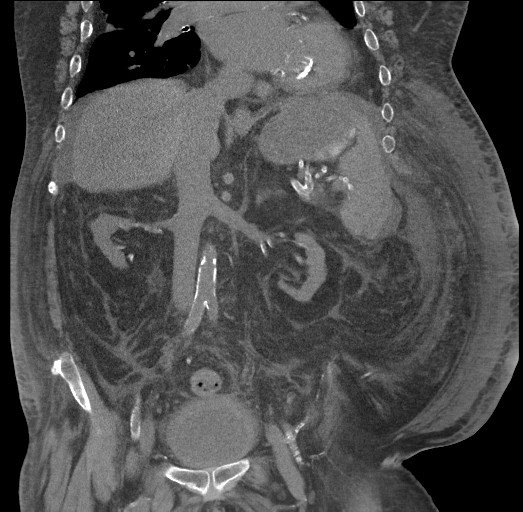

[16 of 46 positions shown; findings below may reference images not displayed]

FINDINGS: Lower chest: Marked severity areas of consolidation are seen within
the bilateral lung bases.

There are small bilateral pleural effusions.

Hepatobiliary: Nodularity of the liver contour is again seen. No
focal liver abnormality is identified. No gallstones, gallbladder
wall thickening, or biliary dilatation.

Pancreas: Mild peripancreatic inflammatory fat stranding is seen.
This is similar in appearance to the findings noted on the prior
study. There is no evidence of pancreatic ductal dilatation.

Spleen: Normal in size without focal abnormality.

Adrenals/Urinary Tract: Adrenal glands are unremarkable. Bilateral
renal cortical thinning is seen without renal calculi or
hydronephrosis. Marked severity, predominately anterior urinary
bladder wall thickening is seen with moderate severity surrounding
inflammatory fat stranding.

Stomach/Bowel: Stomach is within normal limits. The appendix is not
identified. No evidence of bowel wall thickening, distention, or
inflammatory changes.

Vascular/Lymphatic: Aortic atherosclerosis. No enlarged abdominal or
pelvic lymph nodes.

Reproductive: The prostate gland is mildly enlarged.

Other: Marked severity anasarca is seen along the lateral aspects of
the abdominal and pelvic walls.

There is a mild amount of ascites.

Musculoskeletal: A fracture deformity of indeterminate age is seen
involving the L4 vertebral body. This represents a new finding when
compared to the prior study.

Multilevel degenerative changes are seen throughout the lumbar
spine.
IMPRESSION: 1. Marked severity areas of bibasilar consolidation with small
bilateral pleural effusions.
2. Bladder wall thickening which may represent sequelae associated
with cystitis. Correlation with urinalysis is recommended.
3. Findings which may represent acute pancreatitis. Correlation with
pancreatic enzymes is recommended.
4. Age-indeterminate fracture deformity involving the L4 vertebral
body. This represents a new finding when compared to the prior
study. Correlation with MRI is recommended.
5. Mild amount of ascites.
6. Aortic atherosclerosis.

Aortic Atherosclerosis ([id number].N).

## 2022-12-13 IMAGING — DX DG CHEST 1V PORT
1 series · 1 of 1 positions shown · non-contrast
Comparison: 05/06/2021

CLINICAL DATA: Missed dialysis

EXAM:
PORTABLE CHEST 1 VIEW

[chest ap]
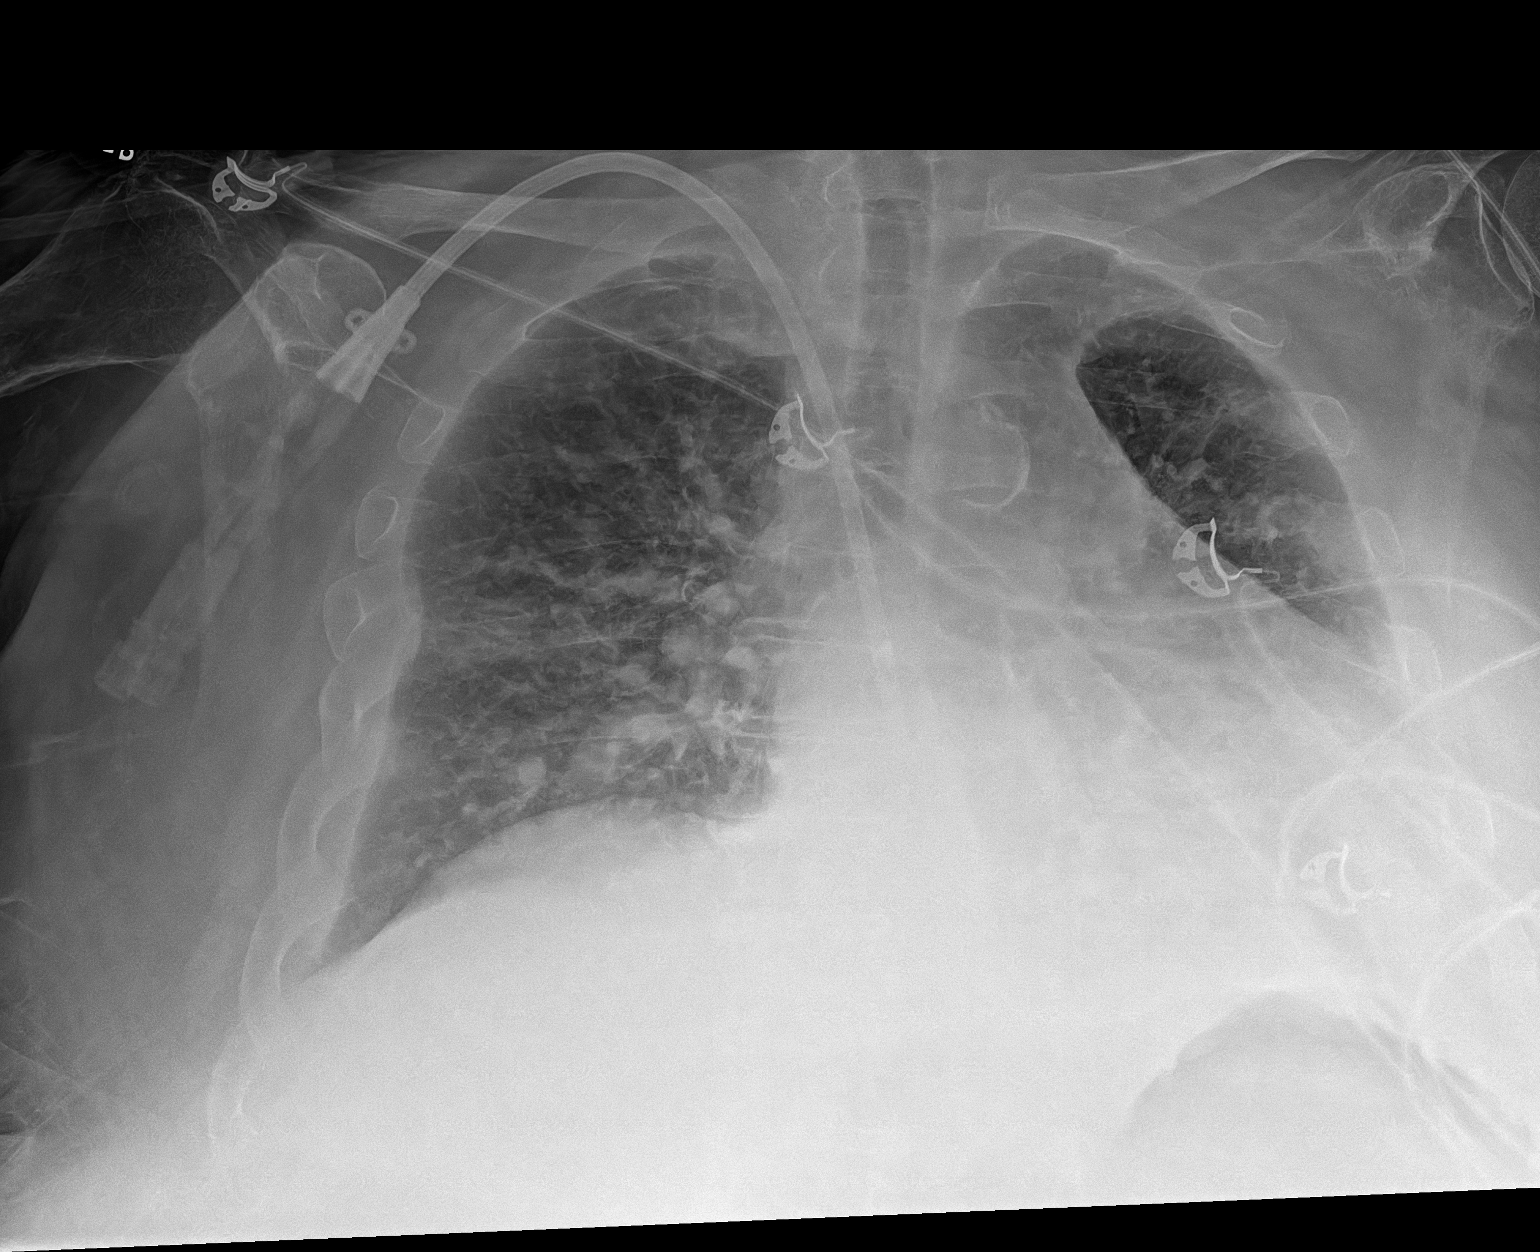

[1 of 1 positions shown; findings below may reference images not displayed]

FINDINGS: Improvement in vascular congestion and edema since the prior study.
Mild vascular congestion remains. Small right effusion has improved.

Cardiac enlargement. Left lower lobe atelectasis has improved. No
dialysis catheter in the right atrium unchanged
IMPRESSION: Improvement in bilateral airspace disease compatible with clearing
edema.

Improvement in right pleural effusion. Mild left lower lobe
atelectasis also improved.
